# Patient Record
Sex: Female | Born: 1945 | Race: White | Hispanic: No | Marital: Married | Smoking: Never smoker
Health system: Southern US, Community
[De-identification: ages and names within clinical notes are randomized; demographics above are authoritative.]

## PROBLEM LIST (undated history)

## (undated) DIAGNOSIS — R112 Nausea with vomiting, unspecified: Secondary | ICD-10-CM

## (undated) DIAGNOSIS — J45909 Unspecified asthma, uncomplicated: Secondary | ICD-10-CM

## (undated) DIAGNOSIS — M069 Rheumatoid arthritis, unspecified: Secondary | ICD-10-CM

## (undated) DIAGNOSIS — G4730 Sleep apnea, unspecified: Secondary | ICD-10-CM

## (undated) DIAGNOSIS — K589 Irritable bowel syndrome without diarrhea: Secondary | ICD-10-CM

## (undated) DIAGNOSIS — J849 Interstitial pulmonary disease, unspecified: Secondary | ICD-10-CM

## (undated) DIAGNOSIS — I10 Essential (primary) hypertension: Secondary | ICD-10-CM

## (undated) DIAGNOSIS — I272 Pulmonary hypertension, unspecified: Secondary | ICD-10-CM

## (undated) DIAGNOSIS — M1990 Unspecified osteoarthritis, unspecified site: Secondary | ICD-10-CM

## (undated) DIAGNOSIS — Z9981 Dependence on supplemental oxygen: Secondary | ICD-10-CM

## (undated) DIAGNOSIS — K219 Gastro-esophageal reflux disease without esophagitis: Secondary | ICD-10-CM

## (undated) DIAGNOSIS — F419 Anxiety disorder, unspecified: Secondary | ICD-10-CM

## (undated) DIAGNOSIS — Z9889 Other specified postprocedural states: Secondary | ICD-10-CM

## (undated) HISTORY — DX: Unspecified asthma, uncomplicated: J45.909

## (undated) HISTORY — DX: Gastro-esophageal reflux disease without esophagitis: K21.9

## (undated) HISTORY — DX: Rheumatoid arthritis, unspecified: M06.9

## (undated) HISTORY — PX: ABDOMINAL HYSTERECTOMY: SHX81

## (undated) HISTORY — DX: Pulmonary hypertension, unspecified: I27.20

## (undated) HISTORY — PX: TONSILLECTOMY: SUR1361

## (undated) HISTORY — PX: TOTAL KNEE ARTHROPLASTY: SHX125

## (undated) HISTORY — PX: BACK SURGERY: SHX140

## (undated) HISTORY — PX: CHOLECYSTECTOMY: SHX55

## (undated) HISTORY — DX: Irritable bowel syndrome, unspecified: K58.9

---

## 1975-07-19 HISTORY — PX: OTHER SURGICAL HISTORY: SHX169

## 2001-05-03 ENCOUNTER — Encounter: Payer: Self-pay | Admitting: Orthopedic Surgery

## 2001-05-07 ENCOUNTER — Inpatient Hospital Stay (HOSPITAL_COMMUNITY): Admission: RE | Admit: 2001-05-07 | Discharge: 2001-05-11 | Payer: Self-pay | Admitting: Orthopedic Surgery

## 2001-09-07 ENCOUNTER — Other Ambulatory Visit: Admission: RE | Admit: 2001-09-07 | Discharge: 2001-09-07 | Payer: Self-pay | Admitting: Obstetrics and Gynecology

## 2001-10-25 ENCOUNTER — Ambulatory Visit (HOSPITAL_COMMUNITY): Admission: RE | Admit: 2001-10-25 | Discharge: 2001-10-25 | Payer: Self-pay | Admitting: Gastroenterology

## 2001-10-25 ENCOUNTER — Encounter (INDEPENDENT_AMBULATORY_CARE_PROVIDER_SITE_OTHER): Payer: Self-pay | Admitting: Specialist

## 2002-10-11 ENCOUNTER — Other Ambulatory Visit: Admission: RE | Admit: 2002-10-11 | Discharge: 2002-10-11 | Payer: Self-pay | Admitting: Obstetrics and Gynecology

## 2003-01-03 ENCOUNTER — Encounter: Admission: RE | Admit: 2003-01-03 | Discharge: 2003-01-03 | Payer: Self-pay | Admitting: Gastroenterology

## 2003-01-03 ENCOUNTER — Encounter: Payer: Self-pay | Admitting: Gastroenterology

## 2003-08-25 ENCOUNTER — Inpatient Hospital Stay (HOSPITAL_COMMUNITY): Admission: RE | Admit: 2003-08-25 | Discharge: 2003-08-29 | Payer: Self-pay | Admitting: Orthopedic Surgery

## 2003-10-21 ENCOUNTER — Other Ambulatory Visit: Admission: RE | Admit: 2003-10-21 | Discharge: 2003-10-21 | Payer: Self-pay | Admitting: Obstetrics and Gynecology

## 2004-07-21 ENCOUNTER — Ambulatory Visit: Payer: Self-pay | Admitting: Internal Medicine

## 2004-08-26 ENCOUNTER — Ambulatory Visit: Payer: Self-pay | Admitting: Internal Medicine

## 2004-10-06 ENCOUNTER — Ambulatory Visit: Payer: Self-pay | Admitting: Internal Medicine

## 2004-10-28 ENCOUNTER — Ambulatory Visit: Payer: Self-pay | Admitting: Internal Medicine

## 2004-11-25 ENCOUNTER — Other Ambulatory Visit: Admission: RE | Admit: 2004-11-25 | Discharge: 2004-11-25 | Payer: Self-pay | Admitting: Obstetrics and Gynecology

## 2004-11-25 ENCOUNTER — Encounter: Admission: RE | Admit: 2004-11-25 | Discharge: 2004-11-25 | Payer: Self-pay | Admitting: Gastroenterology

## 2004-12-22 ENCOUNTER — Ambulatory Visit: Payer: Self-pay | Admitting: Internal Medicine

## 2005-03-10 ENCOUNTER — Ambulatory Visit: Payer: Self-pay | Admitting: Internal Medicine

## 2005-03-23 ENCOUNTER — Ambulatory Visit: Payer: Self-pay | Admitting: Internal Medicine

## 2005-04-20 ENCOUNTER — Ambulatory Visit: Payer: Self-pay | Admitting: Internal Medicine

## 2005-05-18 ENCOUNTER — Ambulatory Visit: Payer: Self-pay | Admitting: Internal Medicine

## 2005-06-16 ENCOUNTER — Ambulatory Visit: Payer: Self-pay | Admitting: Internal Medicine

## 2005-06-28 ENCOUNTER — Encounter (INDEPENDENT_AMBULATORY_CARE_PROVIDER_SITE_OTHER): Payer: Self-pay | Admitting: *Deleted

## 2005-06-28 ENCOUNTER — Ambulatory Visit (HOSPITAL_COMMUNITY): Admission: RE | Admit: 2005-06-28 | Discharge: 2005-06-28 | Payer: Self-pay | Admitting: Surgery

## 2005-07-01 ENCOUNTER — Inpatient Hospital Stay (HOSPITAL_COMMUNITY): Admission: EM | Admit: 2005-07-01 | Discharge: 2005-07-06 | Payer: Self-pay | Admitting: *Deleted

## 2005-08-05 ENCOUNTER — Ambulatory Visit: Payer: Self-pay | Admitting: Internal Medicine

## 2005-09-01 ENCOUNTER — Ambulatory Visit (HOSPITAL_COMMUNITY): Admission: RE | Admit: 2005-09-01 | Discharge: 2005-09-01 | Payer: Self-pay | Admitting: Gastroenterology

## 2005-09-21 ENCOUNTER — Ambulatory Visit: Payer: Self-pay | Admitting: Internal Medicine

## 2005-10-05 ENCOUNTER — Ambulatory Visit (HOSPITAL_COMMUNITY): Admission: RE | Admit: 2005-10-05 | Discharge: 2005-10-05 | Payer: Self-pay | Admitting: Obstetrics and Gynecology

## 2005-10-05 ENCOUNTER — Encounter (INDEPENDENT_AMBULATORY_CARE_PROVIDER_SITE_OTHER): Payer: Self-pay | Admitting: Specialist

## 2005-10-10 ENCOUNTER — Ambulatory Visit: Payer: Self-pay | Admitting: Internal Medicine

## 2005-10-20 ENCOUNTER — Ambulatory Visit: Payer: Self-pay | Admitting: Pulmonary Disease

## 2005-10-31 ENCOUNTER — Ambulatory Visit: Payer: Self-pay | Admitting: Internal Medicine

## 2005-11-09 ENCOUNTER — Ambulatory Visit: Payer: Self-pay | Admitting: Internal Medicine

## 2005-11-16 ENCOUNTER — Ambulatory Visit: Payer: Self-pay | Admitting: Internal Medicine

## 2005-12-21 ENCOUNTER — Ambulatory Visit: Payer: Self-pay | Admitting: Internal Medicine

## 2006-01-10 ENCOUNTER — Ambulatory Visit: Payer: Self-pay | Admitting: Internal Medicine

## 2006-02-27 ENCOUNTER — Encounter (INDEPENDENT_AMBULATORY_CARE_PROVIDER_SITE_OTHER): Payer: Self-pay | Admitting: *Deleted

## 2006-02-28 ENCOUNTER — Inpatient Hospital Stay (HOSPITAL_COMMUNITY): Admission: RE | Admit: 2006-02-28 | Discharge: 2006-03-01 | Payer: Self-pay | Admitting: Obstetrics and Gynecology

## 2006-03-27 ENCOUNTER — Ambulatory Visit: Payer: Self-pay | Admitting: Internal Medicine

## 2006-05-11 ENCOUNTER — Ambulatory Visit: Payer: Self-pay | Admitting: Internal Medicine

## 2006-06-20 ENCOUNTER — Ambulatory Visit: Payer: Self-pay | Admitting: Internal Medicine

## 2006-07-20 ENCOUNTER — Ambulatory Visit: Payer: Self-pay | Admitting: Internal Medicine

## 2006-08-18 ENCOUNTER — Ambulatory Visit: Payer: Self-pay | Admitting: Internal Medicine

## 2006-08-22 ENCOUNTER — Ambulatory Visit: Payer: Self-pay | Admitting: Internal Medicine

## 2006-08-29 ENCOUNTER — Ambulatory Visit: Payer: Self-pay | Admitting: Internal Medicine

## 2006-08-29 LAB — CONVERTED CEMR LAB
Basophils Absolute: 0 10*3/uL (ref 0.0–0.1)
Basophils Relative: 0.1 % (ref 0.0–1.0)
Eosinophils Absolute: 0 10*3/uL (ref 0.0–0.6)
Eosinophils Relative: 0 % (ref 0.0–5.0)
HCT: 44.3 % (ref 36.0–46.0)
Hemoglobin: 15.2 g/dL — ABNORMAL HIGH (ref 12.0–15.0)
Lymphocytes Relative: 11.7 % — ABNORMAL LOW (ref 12.0–46.0)
MCHC: 34.3 g/dL (ref 30.0–36.0)
MCV: 91.6 fL (ref 78.0–100.0)
Monocytes Absolute: 1 10*3/uL — ABNORMAL HIGH (ref 0.2–0.7)
Monocytes Relative: 6.9 % (ref 3.0–11.0)
Neutro Abs: 12 10*3/uL — ABNORMAL HIGH (ref 1.4–7.7)
Neutrophils Relative %: 81.3 % — ABNORMAL HIGH (ref 43.0–77.0)
Platelets: 437 10*3/uL — ABNORMAL HIGH (ref 150–400)
RBC: 4.83 M/uL (ref 3.87–5.11)
RDW: 14.2 % (ref 11.5–14.6)
WBC: 14.7 10*3/uL — ABNORMAL HIGH (ref 4.5–10.5)

## 2006-09-08 ENCOUNTER — Ambulatory Visit: Payer: Self-pay | Admitting: Internal Medicine

## 2006-10-19 ENCOUNTER — Ambulatory Visit: Payer: Self-pay | Admitting: Internal Medicine

## 2006-10-20 ENCOUNTER — Encounter: Payer: Self-pay | Admitting: Internal Medicine

## 2006-11-30 ENCOUNTER — Ambulatory Visit: Payer: Self-pay | Admitting: Internal Medicine

## 2006-12-29 ENCOUNTER — Ambulatory Visit: Payer: Self-pay | Admitting: Internal Medicine

## 2007-01-26 ENCOUNTER — Encounter: Admission: RE | Admit: 2007-01-26 | Discharge: 2007-01-26 | Payer: Self-pay | Admitting: Family Medicine

## 2007-02-06 ENCOUNTER — Ambulatory Visit: Payer: Self-pay | Admitting: Internal Medicine

## 2007-02-07 ENCOUNTER — Ambulatory Visit (HOSPITAL_BASED_OUTPATIENT_CLINIC_OR_DEPARTMENT_OTHER): Admission: RE | Admit: 2007-02-07 | Discharge: 2007-02-07 | Payer: Self-pay | Admitting: Orthopedic Surgery

## 2007-04-17 ENCOUNTER — Ambulatory Visit: Payer: Self-pay | Admitting: Internal Medicine

## 2007-05-10 ENCOUNTER — Ambulatory Visit: Payer: Self-pay | Admitting: Internal Medicine

## 2007-05-12 DIAGNOSIS — K219 Gastro-esophageal reflux disease without esophagitis: Secondary | ICD-10-CM | POA: Insufficient documentation

## 2007-05-12 DIAGNOSIS — M069 Rheumatoid arthritis, unspecified: Secondary | ICD-10-CM | POA: Insufficient documentation

## 2007-05-12 DIAGNOSIS — J3089 Other allergic rhinitis: Secondary | ICD-10-CM | POA: Insufficient documentation

## 2007-05-12 DIAGNOSIS — J209 Acute bronchitis, unspecified: Secondary | ICD-10-CM | POA: Insufficient documentation

## 2007-05-12 DIAGNOSIS — J302 Other seasonal allergic rhinitis: Secondary | ICD-10-CM | POA: Insufficient documentation

## 2007-07-10 ENCOUNTER — Ambulatory Visit: Payer: Self-pay | Admitting: Internal Medicine

## 2007-07-19 HISTORY — PX: ANKLE FUSION: SHX881

## 2007-08-29 ENCOUNTER — Telehealth: Payer: Self-pay | Admitting: Internal Medicine

## 2007-09-07 ENCOUNTER — Ambulatory Visit: Payer: Self-pay | Admitting: Internal Medicine

## 2007-11-06 ENCOUNTER — Ambulatory Visit: Payer: Self-pay | Admitting: Internal Medicine

## 2007-11-23 ENCOUNTER — Ambulatory Visit: Payer: Self-pay | Admitting: Internal Medicine

## 2007-12-21 ENCOUNTER — Telehealth: Payer: Self-pay | Admitting: Internal Medicine

## 2008-01-17 ENCOUNTER — Ambulatory Visit: Payer: Self-pay | Admitting: Internal Medicine

## 2008-02-07 ENCOUNTER — Encounter: Payer: Self-pay | Admitting: Internal Medicine

## 2008-02-11 ENCOUNTER — Telehealth (INDEPENDENT_AMBULATORY_CARE_PROVIDER_SITE_OTHER): Payer: Self-pay | Admitting: *Deleted

## 2008-02-27 ENCOUNTER — Ambulatory Visit: Payer: Self-pay | Admitting: Internal Medicine

## 2008-03-03 ENCOUNTER — Ambulatory Visit: Payer: Self-pay | Admitting: Internal Medicine

## 2008-03-03 ENCOUNTER — Ambulatory Visit: Admission: RE | Admit: 2008-03-03 | Discharge: 2008-03-03 | Payer: Self-pay | Admitting: Family Medicine

## 2008-03-05 ENCOUNTER — Ambulatory Visit: Payer: Self-pay | Admitting: Cardiology

## 2008-03-10 ENCOUNTER — Telehealth (INDEPENDENT_AMBULATORY_CARE_PROVIDER_SITE_OTHER): Payer: Self-pay | Admitting: *Deleted

## 2008-03-25 ENCOUNTER — Ambulatory Visit: Payer: Self-pay | Admitting: Internal Medicine

## 2008-03-25 DIAGNOSIS — F341 Dysthymic disorder: Secondary | ICD-10-CM | POA: Insufficient documentation

## 2008-03-31 ENCOUNTER — Encounter: Payer: Self-pay | Admitting: Internal Medicine

## 2008-04-03 ENCOUNTER — Ambulatory Visit: Payer: Self-pay | Admitting: Professional

## 2008-04-03 ENCOUNTER — Telehealth (INDEPENDENT_AMBULATORY_CARE_PROVIDER_SITE_OTHER): Payer: Self-pay | Admitting: *Deleted

## 2008-04-10 ENCOUNTER — Ambulatory Visit: Payer: Self-pay | Admitting: Professional

## 2008-04-21 ENCOUNTER — Ambulatory Visit: Payer: Self-pay | Admitting: Professional

## 2008-04-22 ENCOUNTER — Encounter: Payer: Self-pay | Admitting: Internal Medicine

## 2008-04-28 ENCOUNTER — Ambulatory Visit: Payer: Self-pay | Admitting: Professional

## 2008-04-30 ENCOUNTER — Ambulatory Visit: Payer: Self-pay | Admitting: Pulmonary Disease

## 2008-05-01 ENCOUNTER — Encounter: Payer: Self-pay | Admitting: Internal Medicine

## 2008-05-05 ENCOUNTER — Ambulatory Visit: Payer: Self-pay | Admitting: Professional

## 2008-05-12 ENCOUNTER — Ambulatory Visit: Payer: Self-pay | Admitting: Professional

## 2008-05-15 ENCOUNTER — Ambulatory Visit: Payer: Self-pay | Admitting: Internal Medicine

## 2008-05-19 ENCOUNTER — Ambulatory Visit: Payer: Self-pay | Admitting: Professional

## 2008-05-29 ENCOUNTER — Telehealth (INDEPENDENT_AMBULATORY_CARE_PROVIDER_SITE_OTHER): Payer: Self-pay | Admitting: *Deleted

## 2008-06-02 ENCOUNTER — Ambulatory Visit: Payer: Self-pay | Admitting: Professional

## 2008-06-11 ENCOUNTER — Ambulatory Visit: Payer: Self-pay | Admitting: Internal Medicine

## 2008-06-16 ENCOUNTER — Ambulatory Visit: Payer: Self-pay | Admitting: Professional

## 2008-06-30 ENCOUNTER — Ambulatory Visit: Payer: Self-pay | Admitting: Professional

## 2008-07-03 ENCOUNTER — Telehealth (INDEPENDENT_AMBULATORY_CARE_PROVIDER_SITE_OTHER): Payer: Self-pay | Admitting: *Deleted

## 2008-07-04 ENCOUNTER — Ambulatory Visit: Payer: Self-pay | Admitting: Internal Medicine

## 2008-08-13 ENCOUNTER — Ambulatory Visit: Payer: Self-pay | Admitting: Internal Medicine

## 2008-09-16 ENCOUNTER — Ambulatory Visit: Payer: Self-pay | Admitting: Internal Medicine

## 2008-09-17 ENCOUNTER — Telehealth (INDEPENDENT_AMBULATORY_CARE_PROVIDER_SITE_OTHER): Payer: Self-pay | Admitting: *Deleted

## 2008-10-03 ENCOUNTER — Ambulatory Visit: Payer: Self-pay | Admitting: Internal Medicine

## 2008-12-19 ENCOUNTER — Ambulatory Visit: Payer: Self-pay | Admitting: Internal Medicine

## 2008-12-19 DIAGNOSIS — J328 Other chronic sinusitis: Secondary | ICD-10-CM | POA: Insufficient documentation

## 2009-01-30 ENCOUNTER — Ambulatory Visit: Payer: Self-pay | Admitting: Internal Medicine

## 2009-04-20 ENCOUNTER — Ambulatory Visit: Payer: Self-pay | Admitting: Internal Medicine

## 2009-04-20 DIAGNOSIS — J4540 Moderate persistent asthma, uncomplicated: Secondary | ICD-10-CM | POA: Insufficient documentation

## 2009-05-04 ENCOUNTER — Ambulatory Visit: Payer: Self-pay | Admitting: Internal Medicine

## 2009-05-04 ENCOUNTER — Telehealth: Payer: Self-pay | Admitting: Internal Medicine

## 2009-07-21 ENCOUNTER — Ambulatory Visit: Payer: Self-pay | Admitting: Internal Medicine

## 2009-07-21 DIAGNOSIS — J449 Chronic obstructive pulmonary disease, unspecified: Secondary | ICD-10-CM | POA: Insufficient documentation

## 2009-07-21 DIAGNOSIS — J439 Emphysema, unspecified: Secondary | ICD-10-CM | POA: Insufficient documentation

## 2009-07-21 DIAGNOSIS — J441 Chronic obstructive pulmonary disease with (acute) exacerbation: Secondary | ICD-10-CM | POA: Insufficient documentation

## 2009-07-24 ENCOUNTER — Telehealth: Payer: Self-pay | Admitting: Internal Medicine

## 2009-08-18 ENCOUNTER — Ambulatory Visit: Payer: Self-pay | Admitting: Internal Medicine

## 2009-08-18 DIAGNOSIS — B370 Candidal stomatitis: Secondary | ICD-10-CM | POA: Insufficient documentation

## 2009-10-02 ENCOUNTER — Ambulatory Visit: Payer: Self-pay | Admitting: Internal Medicine

## 2009-12-02 ENCOUNTER — Ambulatory Visit: Payer: Self-pay | Admitting: Internal Medicine

## 2009-12-08 ENCOUNTER — Telehealth: Payer: Self-pay | Admitting: Internal Medicine

## 2009-12-24 ENCOUNTER — Ambulatory Visit: Payer: Self-pay | Admitting: Internal Medicine

## 2009-12-29 ENCOUNTER — Telehealth: Payer: Self-pay | Admitting: Internal Medicine

## 2010-01-12 ENCOUNTER — Ambulatory Visit: Payer: Self-pay | Admitting: Internal Medicine

## 2010-02-17 ENCOUNTER — Ambulatory Visit: Payer: Self-pay | Admitting: Internal Medicine

## 2010-04-01 ENCOUNTER — Telehealth: Payer: Self-pay | Admitting: Internal Medicine

## 2010-05-21 ENCOUNTER — Ambulatory Visit: Payer: Self-pay | Admitting: Internal Medicine

## 2010-06-14 ENCOUNTER — Ambulatory Visit: Payer: Self-pay | Admitting: Internal Medicine

## 2010-06-14 ENCOUNTER — Telehealth (INDEPENDENT_AMBULATORY_CARE_PROVIDER_SITE_OTHER): Payer: Self-pay | Admitting: *Deleted

## 2010-06-23 ENCOUNTER — Telehealth (INDEPENDENT_AMBULATORY_CARE_PROVIDER_SITE_OTHER): Payer: Self-pay | Admitting: *Deleted

## 2010-07-15 ENCOUNTER — Telehealth (INDEPENDENT_AMBULATORY_CARE_PROVIDER_SITE_OTHER): Payer: Self-pay | Admitting: *Deleted

## 2010-07-29 ENCOUNTER — Encounter
Admission: RE | Admit: 2010-07-29 | Discharge: 2010-07-29 | Payer: Self-pay | Source: Home / Self Care | Attending: Orthopedic Surgery | Admitting: Orthopedic Surgery

## 2010-08-17 NOTE — Assessment & Plan Note (Signed)
Summary: 4 months/apc   Primary Provider/Referring Provider:  W. Elkins/ Truslow  CC:  4 month follow up-allergies..  History of Present Illness: 08/13/08- Allergic rhinitis, asthmaticbronchitis, RA Stressed- mother GI bleed/diverticulosis. Got run down and chilled caring then for mother. Was feeling well until 2 days ago woke from sleep with cough. Still some mild postnasal drainage, coughs some mucus. Going to [place] in March.  09/16/08- Allergic rhinitis, asthmatic bronchitis Going to [place] in a few days. Has not started a pred taper Dr Charlestine Night gave her for the trip.  3 days ago began getting hoarse, with green from nose and chest with Neti pot and nebulizer.Doubts fever but throat feels irritated. Daughter has "bacterial throat infection". Had Norovirus GI, now resolved.  12/19/08- Allergic rhinitis, rhinosinusitis, asthmatic bronchitis, rheumatoid arthritis Needed antibiotic and diflucan while in [place], but did well for awhile on return. In last 2-3 weeks - cough with yellow, getting worse and waking her at night, nose and throat pressure and drainage, no fever. Neti pot brings out green. Exposed to a lot of Tarita Deshmukh grandchildren with coughs etc.  April 20, 2009-  Allergic rhinits, rhinosinusitis, asthmatic bronchitis, Rheumatoid Arthritis Tussionex and benzonatate have been a help for cough. Some wheeze. last definite purulent discharge was at least 2 weeks ago. Rain clears the air for her.      Current Medications (verified): 1)  Singulair 10 Mg  Tabs (Montelukast Sodium) .... One By Mouth Once Daily 2)  Claritin 10 Mg Tabs (Loratadine) .... Take 1 By Mouth Once Daily 3)  Verapamil Hcl Cr 240 Mg  Tbcr (Verapamil Hcl) .... One By Mouth Once Daily 4)  Klor-Con 20 Meq Pack (Potassium Chloride) .... Take 1 Tablet By Mouth Four Times A Day 5)  Triamterene-Hctz 75-50 Mg  Tabs (Triamterene-Hctz) .... Take 1-2  Tablet By Mouth Once A Day 6)  Clidinium-Chlordiazepoxide 2.5-5 Mg  Caps  (Clidinium-Chlordiazepoxide) .... Three Times A Day 7)  Nexium 40 Mg  Cpdr (Esomeprazole Magnesium) .... Take 1 By Mouth Once Daily 8)  Flonase 50 Mcg/act  Susp (Fluticasone Propionate) .... 2 Puffs in A.m. 9)  Vitamin D 50000 Unit  Caps (Ergocalciferol) .... One By Mouth Every Two Weeks 10)  Fosamax Plus D 70-5600 Mg-Unit  Tabs (Alendronate-Cholecalciferol) .... Once A Week 11)  Symbicort 160-4.5 Mcg/act  Aero (Budesonide-Formoterol Fumarate) .... Two Puffs Twice Daily 12)  Proair Hfa 108 (90 Base) Mcg/act Aers (Albuterol Sulfate) .... 2 Puffs Every 4-6 Hours As Needed 13)  Allergy Vaccine 1:10 Go (W-E) .... Once A Week 14)  Epipen 2-Pak 0.3 Mg/0.74m (1:1000)  Devi (Epinephrine Hcl (Anaphylaxis)) .... For Severe Allergic Reaction 15)  Celebrex 200 Mg  Caps (Celecoxib) .... Take 1 Tablet By Mouth Once A Day 16)  Ipratropium-Albuterol 0.5-2.5 (3) Mg/362mSoln (Ipratropium-Albuterol) ...Marland Kitchen 1 Neb Four Times A Day As Needed 17)  Fenofibrate Micronized 134 Mg Caps (Fenofibrate Micronized) .... Take 1 Capsule By Mouth Once A Day 18)  Glycopyrrolate 1 Mg Tabs (Glycopyrrolate) ...Marland Kitchen 1 Tab By Mouth As Needed 19)  Centrum Silver  Tabs (Multiple Vitamins-Minerals) .... Take 1 Tablet By Mouth Once A Day 20)  Xanax 1 Mg Tabs (Alprazolam) .... Take 1 Tab By Mouth At Bedtime 21)  Voltaren 1 % Gel (Diclofenac Sodium) .... Apply To Affected Area Four Times A Day 22)  Prozac 20 Mg Caps (Fluoxetine Hcl) .... Take 1 By Mouth Once Daily 23)  Tussionex Pennkinetic Er 8-10 Mg/86m21mqcr (Chlorpheniramine-Hydrocodone) ....Marland Kitchen1 Tsp Two Times A Day Prn 24)  Tramadol Hcl 50 Mg Tabs (Tramadol Hcl) .... 2 Tablets Every 6 Hrs As Needed 25)  Clarithromycin 250 Mg Tabs (Clarithromycin) .Marland Kitchen.. 1 Two Times A Day Before Meals 26)  Fluconazole 100 Mg Tabs (Fluconazole) .... Take 1 Tablet By Mouth Once A Day X 7 Days 27)  Bd Tb Syringe 27g X 1/2" 0.5 Ml Misc (Tuberculin-Allergy Syringes) .... Use To Give Allergy Vaccine or Other Medications  As Directed 28)  Benzonatate 100 Mg Caps (Benzonatate) .... Take 1 Capsule Four Times A Day As Needed 29)  Chlorzoxazone 500 Mg Tabs (Chlorzoxazone) .... Take 1 By Mouth Every 6 Hours As Needed 30)  Cefdinir 300 Mg Caps (Cefdinir) .... 2 Daily 31)  Fluconazole 150 Mg Tabs (Fluconazole) .Marland Kitchen.. 1 Daily  Allergies (verified): 1)  Erythromycin  Past History:  Past Medical History: Last updated: 03/03/2008 REFLUX, ESOPHAGEAL (ICD-530.81) ASTHMATIC BRONCHITIS, ACUTE (ICD-466.0) RHEUMATOID ARTHRITIS (ICD-714.0) ALLERGIC RHINITIS (ICD-477.9)    Past Surgical History: Last updated: 03/03/2008 Fusion left ankle- 2009  Family History: Last updated: 03/10/2008 Mother- living age 40; heart disease, arthritis. Father- deceased age 25; heart attack. Sibling 1- living age 37; DM, heart attack  Social History: Last updated: 03/10/2008 Patient never smoked.  Positive history of passive tobacco smoke exposure.  Exercise- at least 2 times weekly Caffeine-2 cups in morning ETOH-glass of wine each night. Married with 2 children.   Risk Factors: Smoking Status: never (03/03/2008) Passive Smoke Exposure: yes (03/10/2008)  Review of Systems      See HPI       The patient complains of shortness of breath with activity, shortness of breath at rest, productive cough, and non-productive cough.  The patient denies coughing up blood, chest pain, irregular heartbeats, acid heartburn, indigestion, loss of appetite, weight change, abdominal pain, difficulty swallowing, sore throat, tooth/dental problems, headaches, nasal congestion/difficulty breathing through nose, and sneezing.    Vital Signs:  Patient profile:   65 year old female Weight:      169.38 pounds O2 Sat:      96 % on Room air Pulse rate:   85 / minute BP sitting:   110 / 64  (left arm) Cuff size:   regular  Vitals Entered By: Clayborne Dana CMA (April 20, 2009 2:43 PM)  O2 Flow:  Room air  Physical Exam  Additional Exam:   General: A/Ox3; pleasant and cooperative, NAD, overweight, talkative, she looks as if she feels better. SKIN: no rash, lesions NODES: no lymphadenopathy HEENT: /AT, EOM- WNL, Conjuctivae- clear, PERRLA, TM-WNL, Nose- clear, Throat-  tongue, mild hoarse, thrush, mucus in nose NECK: Supple w/ fair ROM, JVD- none, normal carotid impulses w/o bruits Thyroid- CHEST: Clear to P&A, light loose cough with laiughter HEART: RRR, no m/g/r heard KPQ:AESL, nl pulses, no edema  NEURO: Grossly intact to observation     Impression & Recommendations:  Problem # 1:  ALLERGIC RHINITIS (ICD-477.9) Seasonal and perennial components. Allergy vaccine does seem to help. We reminded about available antihistamines. Her updated medication list for this problem includes:    Claritin 10 Mg Tabs (Loratadine) .Marland Kitchen... Take 1 by mouth once daily    Flonase 50 Mcg/act Susp (Fluticasone propionate) .Marland Kitchen... 2 puffs in a.m.  Problem # 2:  ASTHMA (ICD-493.90) This is near her baseline. Current meds should hold her. We can refill her cough meds for appropriate use, and give flu vax today.  Medications Added to Medication List This Visit: 1)  Prozac 20 Mg Caps (Fluoxetine hcl) .... Take 1 by mouth once  daily  Other Orders: Admin 1st Vaccine [phone]) Flu Vaccine 16yr + ((03500  Patient Instructions: 1)  Please schedule a follow-up appointment in 4 months. 2)  Scripts for Tussionex and benzonatate 3)  Flu vax Prescriptions: BENZONATATE 100 MG CAPS (BENZONATATE) take 1 capsule four times a day as needed  #75 x 5   Entered and Authorized by:   CDeneise LeverMD   Signed by:   CDeneise LeverMD on 04/20/2009   Method used:   Print then Give to Patient   RxID:   [id number] 8-10 MG/5ML LQCR (CHLORPHENIRAMINE-HYDROCODONE) 1 tsp two times a day prn  #300 ml x 0   Entered and Authorized by:   CDeneise LeverMD   Signed by:   CDeneise LeverMD on 04/20/2009   Method used:   Print then Give  to Patient   RxID::   [id number] Flu Vaccine Consent Questions     Do you have a history of severe allergic reactions to this vaccine? no    Any prior history of allergic reactions to egg and/or gelatin? no    Do you have a sensitivity to the preservative Thimersol? no    Do you have a past history of Guillan-Barre Syndrome? no    Do you currently have an acute febrile illness? no    Have you ever had a severe reaction to latex? no    Vaccine information given and explained to patient? yes    Are you currently pregnant? no    Lot Number:[id number]   Exp Date:01/14/2010   Site Given  Left Deltoid IMd by:   CDeneise LeverMD on 04/20/2009   Method used:   Print then Give to Patient   RxID::   [id number]   .lbflu

## 2010-08-17 NOTE — Assessment & Plan Note (Signed)
Summary: GREEN CONGESTION/ SORE THROAT/   Primary Provider/Referring Provider:  W. Elkins/ Truslow  CC:  Accute visit-chest congestion and sore throat. Husband has had Bronchitis recently.[name]  History of Present Illness: April 20, 2009-  Allergic rhinits, rhinosinusitis, asthmatic bronchitis, Rheumatoid Arthritis Tussionex and benzonatate have been a help for cough. Some wheeze. last definite purulent discharge was at least 2 weeks ago. Rain clears the air for her.  July 21, 2009- Allergic rhinitis, rhinosinusitis, asthmatic bronchitis, Rheumatoid arthritis After last here, she says cough never really went away. Feels patch of thrush. [place]. Did get flu shot and has had pneumovax twice. Phlegm won't come up easily.Wheezes. Denies chest pain. May have had some fever. Denies feeling reflux.   August 18, 2009- Allergic rhinitis, rhinosinusitis, asthmatic bronchitis, rheumatoid arthritis Thought she was better after last here, but again hoarse with yelloow/ green nasal drainage. Comes from throat- not chest. Gargles and uses Neti pot  less wheeze- chest is better. She asks about retrying diflucan with an antibiotic.  October 02, 2009- Allergic rhinitis, rhinosinusitis, asthmatic bronchitis, rheumatoid arthritis She is acutely ill and feels she is sick more often. Husband is on Zpak for cough and sore throat. Again noting yellow, sometimes green nasal discharge, throat discomfort, some cough.. She will be moving and they have been stirring up dust as they start the moving process.She describes steady exposure to dust, paint fumes and related irritants as they get the house ready. She felt best while on both antibioitic and diflucan. Some cough and a little wheeze. Describes thick sputum.Thinks she's had some fever.    Current Medications (verified): 1)  Singulair 10 Mg  Tabs (Montelukast Sodium) .... One By Mouth Once Daily 2)  Verapamil Hcl Cr 240 Mg  Tbcr (Verapamil Hcl)  .... One By Mouth Once Daily 3)  Klor-Con 20 Meq Pack (Potassium Chloride) .... Take 1 Tablet By Mouth Four Times A Day 4)  Triamterene-Hctz 75-50 Mg  Tabs (Triamterene-Hctz) .... Take 1-2  Tablet By Mouth Once A Day 5)  Clidinium-Chlordiazepoxide 2.5-5 Mg  Caps (Clidinium-Chlordiazepoxide) .... Three Times A Day 6)  Nexium 40 Mg  Cpdr (Esomeprazole Magnesium) .... Take 1 By Mouth Once Daily 7)  Flonase 50 Mcg/act  Susp (Fluticasone Propionate) .... 2 Puffs in A.m. 8)  Vitamin D 50000 Unit  Caps (Ergocalciferol) .... One By Mouth Every Two Weeks 9)  Fosamax Plus D 70-5600 Mg-Unit  Tabs (Alendronate-Cholecalciferol) .... Once A Week 10)  Symbicort 160-4.5 Mcg/act  Aero (Budesonide-Formoterol Fumarate) .... Two Puffs Twice Daily 11)  Proair Hfa 108 (90 Base) Mcg/act Aers (Albuterol Sulfate) .... 2 Puffs Every 4-6 Hours As Needed 12)  Allergy Vaccine 1:10 Go (W-E) .... Once A Week 13)  Epipen 2-Pak 0.3 Mg/0.28m (1:1000)  Devi (Epinephrine Hcl (Anaphylaxis)) .... For Severe Allergic Reaction 14)  Celebrex 200 Mg  Caps (Celecoxib) .... Take 1 Tablet By Mouth Once A Day 15)  Ipratropium-Albuterol 0.5-2.5 (3) Mg/313mSoln (Ipratropium-Albuterol) ...[name] 1 Neb Four Times A Day As Needed 16)  Fenofibrate Micronized 134 Mg Caps (Fenofibrate Micronized) .... Take 1 Capsule By Mouth Once A Day 17)  Glycopyrrolate 1 Mg Tabs (Glycopyrrolate) ...[name] 1 Tab By Mouth As Needed 18)  Centrum Silver  Tabs (Multiple Vitamins-Minerals) .... Take 1 Tablet By Mouth Once A Day 19)  Xanax 1 Mg Tabs (Alprazolam) .... Take 1 Tab By Mouth At Bedtime 20)  Voltaren 1 % Gel (Diclofenac Sodium) .... Apply To Affected Area Four Times A Day 21)  Prozac 20 Mg Caps (Fluoxetine Hcl) .... Take 1 By Mouth Once Daily 22)  Tramadol Hcl 50 Mg Tabs (Tramadol Hcl) .... 2 Tablets Every 6 Hrs As Needed 23)  Bd Tb Syringe 27g X 1/2" 0.5 Ml Misc (Tuberculin-Allergy Syringes) .... Use To Give Allergy Vaccine or Other Medications As Directed 24)   Benzonatate 100 Mg Caps (Benzonatate) .... Take 1 Capsule Four Times A Day As Needed 25)  Chlorzoxazone 500 Mg Tabs (Chlorzoxazone) .... Take 1 By Mouth Every 6 Hours As Needed 26)  Allegra 180 Mg Tabs (Fexofenadine Hcl) .[name].. 1 Daily As Needed Antihistamine 27)  Fluconazole 150 Mg Tabs (Fluconazole) .[name].. 1 Daily 28)  Mycelex 10 Mg Troc (Clotrimazole) .... Melt in Mouth Twice Daily  Allergies (verified): 1)  ! * Oravig 2)  Erythromycin  Past History:  Past Medical History: Last updated: 03/03/2008 REFLUX, ESOPHAGEAL (ICD-530.81) ASTHMATIC BRONCHITIS, ACUTE (ICD-466.0) RHEUMATOID ARTHRITIS (ICD-714.0) ALLERGIC RHINITIS (ICD-477.9)    Past Surgical History: Last updated: 03/03/2008 Fusion left ankle- 2009  Family History: Last updated: 03/10/2008 Mother- living age 36; heart disease, arthritis. Father- deceased age 75; heart attack. Sibling 1- living age 72; DM, heart attack  Social History: Last updated: 03/10/2008 Patient never smoked.  Positive history of passive tobacco smoke exposure.  Exercise- at least 2 times weekly Caffeine-2 cups in morning ETOH-glass of wine each night. Married with 2 children.   Risk Factors: Smoking Status: never (03/03/2008) Passive Smoke Exposure: yes (03/10/2008)  Review of Systems      See HPI       The patient complains of fever, hoarseness, and prolonged cough.  The patient denies anorexia, weight loss, weight gain, vision loss, decreased hearing, chest pain, syncope, dyspnea on exertion, peripheral edema, headaches, hemoptysis, abdominal pain, and severe indigestion/heartburn.    Vital Signs:  Patient profile:   65 year old female Height:      62 inches Weight:      149.38 pounds BMI:     27.42 O2 Sat:      97 % on Room air Pulse rate:   101 / minute BP sitting:   148 / 90  (right arm) Cuff size:   regular  Vitals Entered By: Clayborne Dana CMA (October 02, 2009 9:13 AM)  O2 Flow:  Room air  Physical Exam  Additional  Exam:  General: A/Ox3; pleasant and cooperative, NAD, overweight, talkative, depressed affect SKIN: no rash, lesions NODES: no lymphadenopathy HEENT: [place]/AT, EOM- WNL, Conjuctivae- clear, PERRLA, TM-WNL, Nose- clear, Throat-  no thrush, red on soft palate., mild hoarse, mucus in nose NECK: Supple w/ fair ROM, JVD- none, normal carotid impulses w/o bruits Thyroid- CHEST: Clear to P&A, light dry coug, no wheeze HEART: RRR, no m/g/r heard ZCH:YIFO, nl pulses, no edema  NEURO: Grossly intact to observation     Impression & Recommendations:  Problem # 1:  RHINOSINUSITIS, 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 I don't see obvious thrush now, but she is using mycelex troches.  I am going to try a maintenance erythromycin strategy to suppress bronchitis. She had some loose stools with full dose erythromycin in past but is interested in trying this strategy.  Problem # 2:  ALLERGIC RHINITIS (ICD-477.9) She continues allergy vaccine. We discussed environmental control issues inher new home, especially to minimize house dust. She remains on allergy vaccine and Flonase, singulair . The following medications were removed from the medication list:    Claritin 10 Mg Tabs (Loratadine) .[name]... Take  1 by mouth once daily Her updated medication list for this problem includes:    Flonase 50 Mcg/act Susp (Fluticasone propionate) .[name]... 2 puffs in a.m.    Allegra 180 Mg Tabs (Fexofenadine hcl) .[name]... 1 daily as needed antihistamine  Problem # 3:  TRACHEOBRONCHITIS (ICD-490)  She isn't really wheezing much and i am going to try getting her off inhaled steroid/ Symbicort. The following medications were removed from the medication list:    Symbicort 160-4.5 Mcg/act Aero (Budesonide-formoterol fumarate) .[name]..[name] Two puffs twice daily    Amoxicillin-pot Clavulanate 875-125 Mg Tabs (Amoxicillin-pot clavulanate) .[name]... 1 twice daily Her updated medication list for this  problem includes:    Singulair 10 Mg Tabs (Montelukast sodium) ..... One by mouth once daily    Proair Hfa 108 (90 Base) Mcg/act Aers (Albuterol sulfate) .[name]... 2 puffs every 4-6 hours as needed    Ipratropium-albuterol 0.5-2.5 (3) Mg/6m Soln (Ipratropium-albuterol) ..[name].. 1 neb four times a day as needed    Benzonatate 100 Mg Caps (Benzonatate) ..[name].. Take 1 capsule four times a day as needed    E.e.s. 400 400 Mg Tabs (Erythromycin ethylsuccinate) ..[name].. 1 daily after meal    E.e.s. 400 400 Mg Tabs (Erythromycin ethylsuccinate) ..[name].. 1 daily afteer meal    Tussionex Pennkinetic Er 8-10 Mg/579mLqcr (Chlorpheniramine-hydrocodone) ...[name]. 1 teaspoon . two times a day as needed cough  Orders: Est. Patient Level III ([phone] Created Electronically ([id number] Medications Added to Medication List This Visit: 1)  Epipen 2-pak 0.3 Mg/0.24m78mevi (Epinephrine) .... For severe allergic reaction 2)  E.e.s. 400 400 Mg Tabs (Erythromycin ethylsuccinate) ....Hailey Kitchen1 daily after meal 3)  E.e.s. 400 400 Mg Tabs (Erythromycin ethylsuccinate) ....Hailey Kitchen1 daily afteer meal 4)  Tussionex Pennkinetic Er 8-10 Mg/5ml39mcr (Chlorpheniramine-hydrocodone) .... [name] teaspoon . two times a day as needed cough  Patient Instructions: 1)  Please schedule a follow-up appointment in 2 months. 2)  Stop Symbicort to see how you do without spraying a steroid into your mouth all the time. 3)  Try taking erythromycin once daily- scripts 4)  Reills to adjust amounts of your meds as discussed.  5)  Scripts to drug store for benzonatate, clotrimazole Prescriptions: TUSSIONEX PENNKINETIC ER 8-10 MG/5ML LQCR (CHLORPHENIRAMINE-HYDROCODONE) 1 teaspoon . two times a day as needed cough  #200 ml x 0   Entered and Authorized by:   ClinDeneise Lever  Signed by:   ClinDeneise Leveron 10/02/2009   Method used:   Print then Give to Patient   RxID:   :   [id number] 0.3 MG/0.3ML DEVI (EPINEPHRINE) For severe allergic reaction   #1 x prn   Entered and Authorized by:   ClinDeneise Lever  Signed by:   ClinDeneise Leveron 10/02/2009   Method used:   Print then Give to Patient   RxID:   [id number]-ALBUTEROL 0.5-2.5 (3) MG/3ML SOLN (IPRATROPIUM-ALBUTEROL) 1 neb four times a day as needed  #120 x prn   Entered and Authorized by:   ClinDeneise Lever  Signed by:   ClinDeneise Leveron 10/02/2009   Method used:   Print then Give to Patient   RxID:   :   [id number]. 400 400 MG TABS (ERYTHROMYCIN ETHYLSUCCINATE) 1 daily afteer meal  #30 x 1   Entered and Authorized by:   ClinDeneise Lever  Signed by:   ClinDeneise Leveron 10/02/2009   Method used:  Print then Give to Patient   RxID:   [id number] E.E.S. 400 400 MG TABS (ERYTHROMYCIN ETHYLSUCCINATE) 1 daily after meal  #7 x 0   Entered and Authorized by:   Deneise Lever MD   Signed by:   Deneise Lever MD on 10/02/2009   Method used:   Print then Give to Patient   RxID:   [id number] MYCELEX 10 MG TROC (CLOTRIMAZOLE) melt in mouth twice daily  #60 x prn   Entered and Authorized by:   Deneise Lever MD   Signed by:   Deneise Lever MD on 10/02/2009   Method used:   Electronically to        [id number] (retail)       [address]       [place], [place]  [address]       Ph: [phone] or [phone]       Fax: [phone]   RxID:   [id number] FLUCONAZOLE 150 MG TABS (FLUCONAZOLE) 1 daily  #20 x 3   Entered and Authorized by:   Deneise Lever MD   Signed by:   Deneise Lever MD on 10/02/2009   Method used:   Electronically to        [facility]. #[id number]* (retail)       [address]       [place], [place]  [address]       Ph: [phone] or [phone]       Fax: [phone]   RxID:   [id number] BENZONATATE 100 MG CAPS (BENZONATATE) take 1 capsule four times a day as needed  #100 x 5   Entered and Authorized by:   Deneise Lever MD   Signed  by:   Deneise Lever MD on 10/02/2009   Method used:   Electronically to        [facility]. #[id number]* (retail)       [address]       [place],   [address]       Ph: [phone] or [phone]       Fax: [phone]   RxID:   [id number]

## 2010-08-17 NOTE — Progress Notes (Signed)
Summary: Nasal drainage  Phone Note Call from Patient Call back at Home Phone [phone]   Caller: Patient Call For: young Summary of Call: Spoke with pt about CT Sinus' results; pt states she is still suffering with nasal drainage and white pus in the back of her throat- she is able to cough it up each morning but is just having a hard time. Please advise. Initial call taken by: Clayborne Dana CMA,  March 10, 2008 9:19 AM  Follow-up for Phone Call        Please offer sample Patanase nasal spray, 1-2 puffs each nostril up to twice daily as needed. She can come by and pick it up. Follow-up by: Deneise Lever MD,  March 10, 2008 11:52 AM  Additional Follow-up for Phone Call Additional follow up Details #1::        Called spoke with pt.  Advised of CY recommendations.  Will come by and pick up sample.  ! box left up front for patient pick-up. Additional Follow-up by: Freddrick March RN,  March 10, 2008 12:06 PM

## 2010-08-17 NOTE — Progress Notes (Signed)
Summary: sore throat  Phone Note Call from Patient   Caller: Patient Call For: young Summary of Call: pt c/o sore throat/ white in color x 4 days. no fever, chills or other complaints. has an appt tomorrow w/ dr young. refused appt w/ tp today in hp office. requests to speak to nurse re: condition. [facility] [address] [phone] Initial call taken by: Cooper Render,  July 03, 2008 10:24 AM  Follow-up for Phone Call        Pt c/o sore throat x 1 wk.  She states that it looks white instead of red.  She states that she has had trush before and this is different.  Denies any other complaints.  I advised for her to keep her appt with CDY for tommorrow to be evaluated.  She agrees and will keep appt. Follow-up by: Tilden Dome,  July 03, 2008 10:55 AM

## 2010-08-17 NOTE — Progress Notes (Signed)
Summary: prescript  Phone Note Call from Patient   Caller: Patient Call For: young Summary of Call: need prescript for symbicort 160/ 4.5 called to pharmacy had samples only [facility] [phone] Initial call taken by: Gustavus Bryant,  May 29, 2008 4:32 PM  Follow-up for Phone Call        pt saw CY on 05-15-08 and was given samples of Symbicort 160/4.5  Per patient instructions: pt could return back to taking the 80/4.5 after taking the samples of the 160/4.5 or call for an rx for the 160/4.5.  Pt requesting rx for 160/4.5  called and spoke with pt and informed her rx sent to pharmacy.  pt also wanted me to update her med list for her as she forgot to add a med when she last saw CY:  Fenofibrate 197m Take 1 tablet by mouth once a day MValrie HartLPN  November 12, 220264:47 PM     New/Updated Medications: SYMBICORT 160-4.5 MCG/ACT  AERO (BUDESONIDE-FORMOTEROL FUMARATE) Two puffs twice daily FENOFIBRATE MICRONIZED 134 MG CAPS (FENOFIBRATE MICRONIZED) Take 1 tablet by mouth once a day   Prescriptions: SYMBICORT 160-4.5 MCG/ACT  AERO (BUDESONIDE-FORMOTEROL FUMARATE) Two puffs twice daily  #1 x 6   Entered by:   MValrie HartLPN   Authorized by:   CDeneise LeverMD   Signed by:   MValrie HartLPN on 169/16/7561  Method used:   Electronically to        [id number] (retail)       [address]       [place]   [id number]      Ph: [phone] [phone]      Fax: [phone]  RxID:   [id number]

## 2010-08-17 NOTE — Assessment & Plan Note (Signed)
Summary: rov 2 months///kp   Primary Provider/Referring Provider:  Viona Gilmore. Elkins/ Truslow  CC:  2 month follow up visit-allergies; strachy throat feeling x 2-3 days.Marland Kitchen  History of Present Illness:  July 21, 2009- Allergic rhinitis, rhinosinusitis, asthmatic bronchitis, Rheumatoid arthritis After last here, she says cough never really went away. Feels patch of thrush. [place]. Did get flu shot and has had pneumovax twice. Phlegm won't come up easily.Wheezes. Denies chest pain. May have had some fever. Denies feeling reflux.   August 18, 2009- Allergic rhinitis, rhinosinusitis, asthmatic bronchitis, rheumatoid arthritis Thought she was better after last here, but again hoarse with yelloow/ green nasal drainage. Comes from throat- not chest. Gargles and uses Neti pot  less wheeze- chest is better. She asks about retrying diflucan with an antibiotic.  October 02, 2009- Allergic rhinitis, rhinosinusitis, asthmatic bronchitis, rheumatoid arthritis She is acutely ill and feels she is sick more often. Husband is on Zpak for cough and sore throat. Again noting yellow, sometimes green nasal discharge, throat discomfort, some cough.. She will be moving and they have been stirring up dust as they start the moving process.She describes steady exposure to dust, paint fumes and related irritants as they get the house ready. She felt best while on both antibioitic and diflucan. Some cough and a little wheeze. Describes thick sputum.Thinks she's had some fever.  Dec 02, 2009- Allergic rhinitis, rhinosinusitis, asthmatic bronchitis, rheumatoid arthritis It took her a month after last here to really feel better. She feels maintenance erythromycin has helped a lot. She had a long interval of productive cough last month as if she was getting cleared out. She is near end of fixing old house with associated dust, and will move in July. Plans to keep her 34 yo grandaughter a lot this summer. We discussed  potential for catching more colds. She continues two times a day Mycelex troches to suppress what she feels is recurrent yeast.  Dr Charlestine Night put her on prednisone for 3 weeks, now off about 3 weeks. It helped arthritic swelling in hands and feet.    Current Medications (verified): 1)  Singulair 10 Mg  Tabs (Montelukast Sodium) .... One By Mouth Once Daily 2)  Verapamil Hcl Cr 240 Mg  Tbcr (Verapamil Hcl) .... One By Mouth Once Daily 3)  Klor-Con 20 Meq Pack (Potassium Chloride) .... Take 1 Tablet By Mouth Four Times A Day 4)  Triamterene-Hctz 75-50 Mg  Tabs (Triamterene-Hctz) .... Take 1-2  Tablet By Mouth Once A Day 5)  Clidinium-Chlordiazepoxide 2.5-5 Mg  Caps (Clidinium-Chlordiazepoxide) .... Three Times A Day 6)  Nexium 40 Mg  Cpdr (Esomeprazole Magnesium) .... Take 1 By Mouth Once Daily 7)  Flonase 50 Mcg/act  Susp (Fluticasone Propionate) .... 2 Puffs in A.m. 8)  Vitamin D 50000 Unit  Caps (Ergocalciferol) .... One By Mouth Every Two Weeks 9)  Fosamax Plus D 70-5600 Mg-Unit  Tabs (Alendronate-Cholecalciferol) .... Once A Week 10)  Proair Hfa 108 (90 Base) Mcg/act Aers (Albuterol Sulfate) .... 2 Puffs Every 4-6 Hours As Needed 11)  Allergy Vaccine 1:10 Go (W-E) .... Once A Week 12)  Epipen 2-Pak 0.3 Mg/0.83m Devi (Epinephrine) .... For Severe Allergic Reaction 13)  Celebrex 200 Mg  Caps (Celecoxib) .... Take 1 Tablet By Mouth Once A Day 14)  Ipratropium-Albuterol 0.5-2.5 (3) Mg/376mSoln (Ipratropium-Albuterol) ...Marland Kitchen 1 Neb Four Times A Day As Needed 15)  Fenofibrate Micronized 134 Mg Caps (Fenofibrate Micronized) .... Take 1 Capsule By Mouth Once A Day 16)  Glycopyrrolate 1 Mg Tabs (Glycopyrrolate) .Marland Kitchen.. 1 Tab By Mouth As Needed 17)  Centrum Silver  Tabs (Multiple Vitamins-Minerals) .... Take 1 Tablet By Mouth Once A Day 18)  Xanax 1 Mg Tabs (Alprazolam) .... Take 1 Tab By Mouth At Bedtime 19)  Voltaren 1 % Gel (Diclofenac Sodium) .... Apply To Affected Area Four Times A Day 20)  Prozac 20  Mg Caps (Fluoxetine Hcl) .... Take 1 By Mouth Once Daily 21)  Tramadol Hcl 50 Mg Tabs (Tramadol Hcl) .... 2 Tablets Every 6 Hrs As Needed 22)  Bd Tb Syringe 27g X 1/2" 0.5 Ml Misc (Tuberculin-Allergy Syringes) .... Use To Give Allergy Vaccine or Other Medications As Directed 23)  Benzonatate 100 Mg Caps (Benzonatate) .... Take 1 Capsule Four Times A Day As Needed 24)  Chlorzoxazone 500 Mg Tabs (Chlorzoxazone) .... Take 1 By Mouth Every 6 Hours As Needed 25)  Allegra 180 Mg Tabs (Fexofenadine Hcl) .Marland Kitchen.. 1 Daily As Needed Antihistamine 26)  Fluconazole 150 Mg Tabs (Fluconazole) .Marland Kitchen.. 1 Daily 27)  Mycelex 10 Mg Troc (Clotrimazole) .... Melt in Mouth Twice Daily 28)  E.e.s. 400 400 Mg Tabs (Erythromycin Ethylsuccinate) .Marland Kitchen.. 1 Daily Afteer Meal 29)  Tussionex Pennkinetic Er 8-10 Mg/5m Lqcr (Chlorpheniramine-Hydrocodone) ..Marland Kitchen. 1 Teaspoon . Two Times A Day As Needed Cough 30)  Allegra-D 24 Hour 180-240 Mg Xr24h-Tab (Fexofenadine-Pseudoephedrine) .... Take 1 By Mouth  Every Morning  Allergies (verified): 1)  ! *Jerral Ralph Past History:  Past Medical History: Last updated: 03/03/2008 REFLUX, ESOPHAGEAL (ICD-530.81) ASTHMATIC BRONCHITIS, ACUTE (ICD-466.0) RHEUMATOID ARTHRITIS (ICD-714.0) ALLERGIC RHINITIS (ICD-477.9)    Past Surgical History: Last updated: 03/03/2008 Fusion left ankle- 2009  Family History: Last updated: 03/10/2008 Mother- living age 67 heart disease, arthritis. Father- deceased age 67 heart attack. Sibling 1- living age 67 DM, heart attack  Social History: Last updated: 03/10/2008 Patient never smoked.  Positive history of passive tobacco smoke exposure.  Exercise- at least 2 times weekly Caffeine-2 cups in morning ETOH-glass of wine each night. Married with 2 children.   Risk Factors: Smoking Status: never (03/03/2008) Passive Smoke Exposure: yes (03/10/2008)  Review of Systems      See HPI       The patient complains of shortness of breath with activity and  nasal congestion/difficulty breathing through nose.  The patient denies shortness of breath at rest, productive cough, non-productive cough, coughing up blood, chest pain, irregular heartbeats, acid heartburn, indigestion, loss of appetite, weight change, abdominal pain, difficulty swallowing, sore throat, tooth/dental problems, headaches, and sneezing.    Vital Signs:  Patient profile:   65year old female Height:      62 inches Weight:      154 pounds BMI:     28.27 O2 Sat:      98 % on Room air Pulse rate:   87 / minute BP sitting:   120 / 70  (left arm) Cuff size:   regular  Vitals Entered By: KClayborne DanaCMA (Dec 02, 2009 10:20 AM)  O2 Flow:  Room air  Physical Exam  Additional Exam:  General: A/Ox3; pleasant and cooperative, NAD, overweight, talkative,  SKIN: no rash, lesions NODES: no lymphadenopathy HEENT: [place]/AT, EOM- WNL, Conjuctivae- clear, PERRLA, TM-WNL, Nose- clear, Throat-  no thrush, red on soft palate.,  NECK: Supple w/ fair ROM, JVD- none, normal carotid impulses w/o bruits Thyroid- CHEST: Clear to P&A, l HEART: RRR, no m/g/r heard EBTD:HRCB nl pulses, no edema  NEURO: Grossly intact to observation  Impression & Recommendations:  Problem # 1:  ALLERGIC RHINITIS (ICD-477.9)  Control is pretty good. Concern about her scratchy throat, but not specific. She asks refill Tussionex.Allegra -D is helping in AM with Allegra 180 taken at night. Admits not using Flonase  regularly. We will continue the troches and try nasalcrom. Her updated medication list for this problem includes:    Flonase 50 Mcg/act Susp (Fluticasone propionate) .Marland Kitchen... 2 puffs in a.m.    Allegra 180 Mg Tabs (Fexofenadine hcl) .Marland Kitchen... 1 daily as needed antihistamine    Nasalcrom 5.2 Mg/act Aers (Cromolyn sodium) .Marland Kitchen... 1-2 sprays each nostril four times a day / as directed  Problem # 2:  TRACHEOBRONCHITIS (ICD-490)  Variable cough is noted at times. The following medications were removed from  the medication list:    E.e.s. 400 400 Mg Tabs (Erythromycin ethylsuccinate) .Marland Kitchen... 1 daily after meal Her updated medication list for this problem includes:    Singulair 10 Mg Tabs (Montelukast sodium) ..... One by mouth once daily    Proair Hfa 108 (90 Base) Mcg/act Aers (Albuterol sulfate) .Marland Kitchen... 2 puffs every 4-6 hours as needed    Ipratropium-albuterol 0.5-2.5 (3) Mg/27m Soln (Ipratropium-albuterol) ..Marland Kitchen.. 1 neb four times a day as needed    Benzonatate 100 Mg Caps (Benzonatate) ..Marland Kitchen.. Take 1 capsule four times a day as needed    E.e.s. 400 400 Mg Tabs (Erythromycin ethylsuccinate) ..Marland Kitchen.. 1 daily afteer meal    Tussionex Pennkinetic Er 8-10 Mg/513mLqcr (Chlorpheniramine-hydrocodone) ...Marland Kitchen. 1 teaspoon . two times a day as needed cough    Allegra-d 24 Hour 180-240 Mg Xr24h-tab (Fexofenadine-pseudoephedrine) ...Marland Kitchen. Take 1 by mouth  every morning    Tussionex Pennkinetic Er 8-10 Mg/43m57mqcr (Chlorpheniramine-hydrocodone) ....Marland Kitchen 1 teaspoon two times a day as needed cough  Medications Added to Medication List This Visit: 1)  Allegra-d 24 Hour 180-240 Mg Xr24h-tab (Fexofenadine-pseudoephedrine) .... Take 1 by mouth  every morning 2)  Nasalcrom 5.2 Mg/act Aers (Cromolyn sodium) ....Marland Kitchen1-2 sprays each nostril four times a day / as directed 3)  Tussionex Pennkinetic Er 8-10 Mg/43ml70mcr (Chlorpheniramine-hydrocodone) .... Marland Kitchen teaspoon two times a day as needed cough  Other Orders: Est. Patient Level III ([id number] Created Electronically ([phone] Instructions: 1)  Please schedule a follow-up appointment in 2 months. 2)  Refills for Tussionex- printed, and for other meds- sent. 3)  Try otc nasal antinflammatory spray Nasalcrom/ cromolyn Prescriptions: TUSSIONEX PENNKINETIC ER 8-10 MG/5ML LQCR (CHLORPHENIRAMINE-HYDROCODONE) 1 teaspoon two times a day as needed cough  #300 ml x 0   Entered and Authorized by:   ClinDeneise Lever  Signed by:   ClinDeneise Leveron 12/02/2009   Method used:    Print then Give to Patient   RxID:   [id number] 50 MCG/ACT  SUSP (FLUTICASONE PROPIONATE) 2 puffs in a.m.  #16 Gram x prn   Entered and Authorized by:   ClinDeneise Lever  Signed by:   ClinDeneise Leveron 12/02/2009   Method used:   [facility] to        [id number]       [address]    [place]  [id number]   Ph: [phone]   Fax: [phone]:   [id number].S. 400 400 MG TABS (ERYTHROMYCIN ETHYLSUCCINATE) 1 daily afteer meal  #  30 x 5   Entered and Authorized by:   Deneise Lever MD   Signed by:   Deneise Lever MD on 12/02/2009   Method used:   Electronically to        [id number] (retail)       [address]       [place], [place]  [address]       Ph: [phone] or [phone]       Fax: [phone]   RxID:   [id number]

## 2010-08-17 NOTE — Letter (Signed)
Summary: CMN/nebulizer/[facility]  CMN/nebulizer/[facility]   Imported By: Bubba Hales 04/28/2008 10:18:13  _____________________________________________________________________  External Attachment:    Type:   Image     Comment:   External Document

## 2010-08-17 NOTE — Assessment & Plan Note (Signed)
Summary: green mucus with cough/ mbw   Primary Provider/Referring Provider:  W. Elkins/ Truslow  CC:  cough-prod; yellow and nasal drainage-green x 2 weeks; suffering with allergies lately. Marland Kitchen  History of Present Illness:  08/13/08- Allergic rhinitis, asthmaticbronchitis, RA Stressed- mother GI bleed/diverticulosis. Got run down and chilled caring then for mother. Was feeling well until 2 days ago woke from sleep with cough. Still some mild postnasal drainage, coughs some mucus. Going to [place] in March.  09/16/08- Allergic rhinitis, asthmatic bronchitis Going to [place] in a few days. Has not started a pred taper Dr Charlestine Night gave her for the trip.  3 days ago began getting hoarse, with green from nose and chest with Neti pot and nebulizer.Doubts fever but throat feels irritated. Daughter has "bacterial throat infection". Had Norovirus GI, now resolved.  12/19/08- Allergic rhinitis, rhinosinusitis, asthmatic bronchitis, rheumatoid arthritis Needed antibiotic and diflucan while in [place], but did well for awhile on return. In last 2-3 weeks - cough with yellow, getting worse and waking her at night, nose and throat pressure and drainage, no fever. Neti pot brings out green. Exposed to a lot of Amias Hutchinson grandchildren with coughs etc.    Current Medications (verified): 1)  Singulair 10 Mg  Tabs (Montelukast Sodium) .... One By Mouth Once Daily 2)  Claritin 10 Mg Tabs (Loratadine) .... Take 1 By Mouth Once Daily 3)  Verapamil Hcl Cr 240 Mg  Tbcr (Verapamil Hcl) .... One By Mouth Once Daily 4)  Klor-Con 20 Meq Pack (Potassium Chloride) .... Take 1 Tablet By Mouth Four Times A Day 5)  Triamterene-Hctz 75-50 Mg  Tabs (Triamterene-Hctz) .... Take 1-2  Tablet By Mouth Once A Day 6)  Clidinium-Chlordiazepoxide 2.5-5 Mg  Caps (Clidinium-Chlordiazepoxide) .... Three Times A Day 7)  Nexium 40 Mg  Cpdr (Esomeprazole Magnesium) .... Take 1 By Mouth Once Daily 8)  Flonase 50 Mcg/act  Susp (Fluticasone Propionate)  .... 2 Puffs in A.m. 9)  Vitamin D 50000 Unit  Caps (Ergocalciferol) .... One By Mouth Every Two Weeks 10)  Fosamax Plus D 70-5600 Mg-Unit  Tabs (Alendronate-Cholecalciferol) .... Once A Week 11)  Symbicort 160-4.5 Mcg/act  Aero (Budesonide-Formoterol Fumarate) .... Two Puffs Twice Daily 12)  Proair Hfa 108 (90 Base) Mcg/act Aers (Albuterol Sulfate) .... 2 Puffs Every 4-6 Hours As Needed 13)  Allergy Vaccine 1:10 Go (W-E) .... Once A Week 14)  Epipen 2-Pak 0.3 Mg/0.40m (1:1000)  Devi (Epinephrine Hcl (Anaphylaxis)) .... For Severe Allergic Reaction 15)  Celebrex 200 Mg  Caps (Celecoxib) .... Take 1 Tablet By Mouth Once A Day 16)  Ipratropium-Albuterol 0.5-2.5 (3) Mg/311mSoln (Ipratropium-Albuterol) ...Marland Kitchen 1 Neb Four Times A Day As Needed 17)  Fenofibrate Micronized 134 Mg Caps (Fenofibrate Micronized) .... Take 1 Capsule By Mouth Once A Day 18)  Glycopyrrolate 1 Mg Tabs (Glycopyrrolate) ...Marland Kitchen 1 Tab By Mouth As Needed 19)  Centrum Silver  Tabs (Multiple Vitamins-Minerals) .... Take 1 Tablet By Mouth Once A Day 20)  Xanax 1 Mg Tabs (Alprazolam) .... Take 1 Tab By Mouth At Bedtime 21)  Voltaren 1 % Gel (Diclofenac Sodium) .... Apply To Affected Area Four Times A Day 22)  Paxil 20 Mg Tabs (Paroxetine Hcl) .... Take 1 1/2 Tabs By Mouth Daily 23)  Tussionex Pennkinetic Er 8-10 Mg/73m51mqcr (Chlorpheniramine-Hydrocodone) ....Marland Kitchen1 Tsp Two Times A Day Prn 24)  Tramadol Hcl 50 Mg Tabs (Tramadol Hcl) .... 2 Tablets Every 6 Hrs As Needed 25)  Clarithromycin 250 Mg Tabs (Clarithromycin) ....Marland Kitchen1 Two Times A  Day Before Meals 26)  Fluconazole 100 Mg Tabs (Fluconazole) .... Take 1 Tablet By Mouth Once A Day X 7 Days 27)  Bd Tb Syringe 27g X 1/2" 0.5 Ml Misc (Tuberculin-Allergy Syringes) .... Use To Give Allergy Vaccine or Other Medications As Directed 28)  Benzonatate 100 Mg Caps (Benzonatate) .... Take 1 Capsule Four Times A Day As Needed 29)  Chlorzoxazone 500 Mg Tabs (Chlorzoxazone) .... Take 1 By Mouth Every 6  Hours As Needed  Allergies (verified): 1)  Erythromycin  Past History:  Past Medical History: Last updated: 03/03/2008 REFLUX, ESOPHAGEAL (ICD-530.81) ASTHMATIC BRONCHITIS, ACUTE (ICD-466.0) RHEUMATOID ARTHRITIS (ICD-714.0) ALLERGIC RHINITIS (ICD-477.9)    Past Surgical History: Last updated: 03/03/2008 Fusion left ankle- 2009  Family History: Last updated: 03/10/2008 Mother- living age 36; heart disease, arthritis. Father- deceased age 55; heart attack. Sibling 1- living age 79; DM, heart attack  Social History: Last updated: 03/10/2008 Patient never smoked.  Positive history of passive tobacco smoke exposure.  Exercise- at least 2 times weekly Caffeine-2 cups in morning ETOH-glass of wine each night. Married with 2 children.   Risk Factors: Smoking Status: never (03/03/2008) Passive Smoke Exposure: yes (03/10/2008)  Review of Systems      See HPI  The patient denies fever, weight loss, hoarseness, chest pain, syncope, headaches, hemoptysis, melena, and severe indigestion/heartburn.    Vital Signs:  Patient profile:   65 year old female Weight:      178 pounds O2 Sat:      98 % on Room air Pulse rate:   84 / minute BP sitting:   120 / 68  (left arm) Cuff size:   regular  Vitals Entered By: Clayborne Dana CMA (December 19, 2008 3:21 PM)  O2 Flow:  Room air CC: cough-prod; yellow and nasal drainage-green x 2 weeks; suffering with allergies lately.  Comments Medications reviewed with patient Clayborne Dana CMA  December 19, 2008 3:22 PM    Physical Exam  Additional Exam:  General: A/Ox3; pleasant and cooperative, NAD, overweight, talkative SKIN: no rash, lesions NODES: no lymphadenopathy HEENT: [place]/AT, EOM- WNL, Conjuctivae- clear, PERRLA, TM-WNL, Nose- clear, Throat-  tongue, mild hoarse, mucus in nose NECK: Supple w/ fair ROM, JVD- none, normal carotid impulses w/o bruits Thyroid- CHEST: Clear to P&A, clear today HEART: RRR, no m/g/r heard NIO:EVOJ, nl pulses,  no edema  NEURO: Grossly intact to observation      Impression & Recommendations:  Problem # 1:  RHINOSINUSITIS, 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 Will treat as has worked before, with antibiotic, steroid inj and diflucan.  Problem # 2:  ASTHMATIC BRONCHITIS, ACUTE (ICD-466.0)  Early exacerbation. Treatment for sinuses may help as above. Her updated medication list for this problem includes:    Singulair 10 Mg Tabs (Montelukast sodium) ..... One by mouth once daily    Symbicort 160-4.5 Mcg/act Aero (Budesonide-formoterol fumarate) .Marland Kitchen..Marland Kitchen Two puffs twice daily    Proair Hfa 108 (90 Base) Mcg/act Aers (Albuterol sulfate) .Marland Kitchen... 2 puffs every 4-6 hours as needed    Ipratropium-albuterol 0.5-2.5 (3) Mg/35m Soln (Ipratropium-albuterol) ..Marland Kitchen.. 1 neb four times a day as needed    Tussionex Pennkinetic Er 8-10 Mg/548mLqcr (Chlorpheniramine-hydrocodone) ...Marland Kitchen. 1 tsp two times a day prn    Clarithromycin 250 Mg Tabs (Clarithromycin) ...Marland Kitchen. 1 two times a day before meals    Benzonatate 100 Mg Caps (Benzonatate) ...Marland Kitchen. Take 1 capsule four times a day as needed    Cefdinir 300 Mg Caps (Cefdinir) ...Marland Kitchen. 2 daily  Orders: Est.  Patient Level III (31674)  Medications Added to Medication List This Visit: 1)  Claritin 10 Mg Tabs (Loratadine) .... Take 1 by mouth once daily 2)  Triamterene-hctz 75-50 Mg Tabs (Triamterene-hctz) .... Take 1-2  tablet by mouth once a day 3)  Chlorzoxazone 500 Mg Tabs (Chlorzoxazone) .... Take 1 by mouth every 6 hours as needed 4)  Cefdinir 300 Mg Caps (Cefdinir) .... 2 daily 5)  Fluconazole 150 Mg Tabs (Fluconazole) .Marland Kitchen.. 1 daily  Other Orders: Admin of Therapeutic Inj  intramuscular or subcutaneous (25525) Depo- Medrol 39m ([id number]  Patient Instructions: 1)  Please schedule a follow-up appointment in 4 months. 2)  scripts for antibiotic and diflucan sent to your drug store 3)  depo 40 Prescriptions: FLUCONAZOLE 150 MG TABS (FLUCONAZOLE) 1 daily  #7 x 1    Entered and Authorized by:   CDeneise LeverMD   Signed by:   CDeneise LeverMD on 12/19/2008   Method used:   Electronically to        [id number] ([facility]       [address]       [place]  [address]      Ph: [phone]      Fax: [phone]  RxID:   [id number] 300 MG CAPS (CEFDINIR) 2 daily  #14 x 1   Entered and Authorized by:   CDeneise LeverMD   Signed by:   CDeneise LeverMD on 12/19/2008   Method used:   Electronically to        [id number] (retail)       [address] [place]  [address]      Ph: [phone]      Fax: [phone]  RxID:   [id number]   Medication Administration  Injection # 1:    Medication: Depo- Medrol 490m   Diagnosis: RHINOSINUSITIS, 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 Patient Level III [9[44715])  Admin of Therapeutic Inj  intramuscular or subcutaneous [96372] 3)  Depo- Medrol 4067mJ1[A0638]

## 2010-08-17 NOTE — Assessment & Plan Note (Signed)
Summary: cough/drainage/apc   Primary Provider/Referring Provider:  W. Elkins/ Truslow  CC:  Accute visit-cough and drainage.Marland Kitchen  History of Present Illness:  October 02, 2009- Allergic rhinitis, rhinosinusitis, asthmatic bronchitis, rheumatoid arthritis She is acutely ill and feels she is sick more often. Husband is on Zpak for cough and sore throat. Again noting yellow, sometimes green nasal discharge, throat discomfort, some cough.. She will be moving and they have been stirring up dust as they start the moving process.She describes steady exposure to dust, paint fumes and related irritants as they get the house ready. She felt best while on both antibioitic and diflucan. Some cough and a little wheeze. Describes thick sputum.Thinks she's had some fever.  Dec 02, 2009- Allergic rhinitis, rhinosinusitis, asthmatic bronchitis, rheumatoid arthritis It took her a month after last here to really feel better. She feels maintenance erythromycin has helped a lot. She had a long interval of productive cough last month as if she was getting cleared out. She is near end of fixing old house with associated dust, and will move in July. Plans to keep her 75 yo grandaughter a lot this summer. We discussed potential for catching more colds. She continues two times a day Mycelex troches to suppress what she feels is recurrent yeast.  Dr Charlestine Night put her on prednisone for 3 weeks, now off about 3 weeks. It helped arthritic swelling in hands and feet.  December 24, 2009- Allergic rhinitis, rhinosinusitis, asthmatic bronchitis, rheumatoid artritis Can't shake the cough and chest congestion. Tried maintenance erythromycin but it just caused loose stools. Tussionex works. Denies any sense of reflux. hears some wheeze. Cough is productive yellow/ clear. needs CXR update.    Asthma History    Asthma Control Assessment:    Age range: 12+ years    Symptoms: 0-2 days/week    Nighttime Awakenings: 0-2/month    Interferes  w/ normal activity: no limitations    SABA use (not for EIB): >2 days/week    Asthma Control Assessment: Not Well Controlled   Preventive Screening-Counseling & Management  Alcohol-Tobacco     Smoking Status: never  Current Medications (verified): 1)  Singulair 10 Mg  Tabs (Montelukast Sodium) .... One By Mouth Once Daily 2)  Verapamil Hcl Cr 240 Mg  Tbcr (Verapamil Hcl) .... One By Mouth Once Daily 3)  Klor-Con 20 Meq Pack (Potassium Chloride) .... Take 1 Tablet By Mouth Four Times A Day 4)  Triamterene-Hctz 75-50 Mg  Tabs (Triamterene-Hctz) .... Take 1-2  Tablet By Mouth Once A Day 5)  Clidinium-Chlordiazepoxide 2.5-5 Mg  Caps (Clidinium-Chlordiazepoxide) .... Three Times A Day 6)  Nexium 40 Mg  Cpdr (Esomeprazole Magnesium) .... Take 1 By Mouth Once Daily 7)  Flonase 50 Mcg/act  Susp (Fluticasone Propionate) .... 2 Puffs in A.m. 8)  Vitamin D 50000 Unit  Caps (Ergocalciferol) .... One By Mouth Every Two Weeks 9)  Fosamax Plus D 70-5600 Mg-Unit  Tabs (Alendronate-Cholecalciferol) .... Once A Week 10)  Proair Hfa 108 (90 Base) Mcg/act Aers (Albuterol Sulfate) .... 2 Puffs Every 4-6 Hours As Needed 11)  Allergy Vaccine 1:10 Go (W-E) .... Once A Week 12)  Epipen 2-Pak 0.3 Mg/0.33m Devi (Epinephrine) .... For Severe Allergic Reaction 13)  Celebrex 200 Mg  Caps (Celecoxib) .... Take 1 Tablet By Mouth Once A Day 14)  Ipratropium-Albuterol 0.5-2.5 (3) Mg/351mSoln (Ipratropium-Albuterol) ...Marland Kitchen 1 Neb Four Times A Day As Needed 15)  Fenofibrate Micronized 134 Mg Caps (Fenofibrate Micronized) .... Take 1 Capsule By Mouth Once A  Day 16)  Glycopyrrolate 1 Mg Tabs (Glycopyrrolate) .Marland Kitchen.. 1 Tab By Mouth As Needed 17)  Centrum Silver  Tabs (Multiple Vitamins-Minerals) .... Take 1 Tablet By Mouth Once A Day 18)  Xanax 1 Mg Tabs (Alprazolam) .... Take 1 Tab By Mouth At Bedtime 19)  Voltaren 1 % Gel (Diclofenac Sodium) .... Apply To Affected Area Four Times A Day 20)  Prozac 20 Mg Caps (Fluoxetine Hcl)  .... Take 1 By Mouth Once Daily 21)  Tramadol Hcl 50 Mg Tabs (Tramadol Hcl) .... 2 Tablets Every 6 Hrs As Needed 22)  Bd Tb Syringe 27g X 1/2" 0.5 Ml Misc (Tuberculin-Allergy Syringes) .... Use To Give Allergy Vaccine or Other Medications As Directed 23)  Benzonatate 100 Mg Caps (Benzonatate) .... Take 1 Capsule Four Times A Day As Needed 24)  Chlorzoxazone 500 Mg Tabs (Chlorzoxazone) .... Take 1 By Mouth Every 6 Hours As Needed 25)  Allegra 180 Mg Tabs (Fexofenadine Hcl) .Marland Kitchen.. 1 Daily As Needed Antihistamine 26)  Fluconazole 150 Mg Tabs (Fluconazole) .Marland Kitchen.. 1 Daily 27)  Mycelex 10 Mg Troc (Clotrimazole) .... Melt in Mouth Twice Daily 28)  Allegra-D 24 Hour 180-240 Mg Xr24h-Tab (Fexofenadine-Pseudoephedrine) .... Take 1 By Mouth  Every Morning 29)  Nasalcrom 5.2 Mg/act Aers (Cromolyn Sodium) .Marland Kitchen.. 1-2 Sprays Each Nostril Four Times A Day / As Directed 30)  Tussionex Pennkinetic Er 8-10 Mg/66m Lqcr (Chlorpheniramine-Hydrocodone) ..Marland Kitchen. 1 Teaspoon Two Times A Day As Needed Cough  Allergies: 1)  ! * Oravig 2)  ! *Lowella Dell Past History:  Past Medical History: Last updated: 03/03/2008 REFLUX, ESOPHAGEAL (ICD-530.81) ASTHMATIC BRONCHITIS, ACUTE (ICD-466.0) RHEUMATOID ARTHRITIS (ICD-714.0) ALLERGIC RHINITIS (ICD-477.9)    Past Surgical History: Last updated: 03/03/2008 Fusion left ankle- 2009  Family History: Last updated: 03/10/2008 Mother- living age 67 heart disease, arthritis. Father- deceased age 170 heart attack. Sibling 1- living age 67 DM, heart attack  Social History: Last updated: 03/10/2008 Patient never smoked.  Positive history of passive tobacco smoke exposure.  Exercise- at least 2 times weekly Caffeine-2 cups in morning ETOH-glass of wine each night. Married with 2 children.   Risk Factors: Smoking Status: never (12/24/2009) Passive Smoke Exposure: yes (03/10/2008)  Review of Systems      See HPI       The patient complains of productive cough.  The patient  denies shortness of breath with activity, shortness of breath at rest, non-productive cough, coughing up blood, chest pain, irregular heartbeats, acid heartburn, indigestion, loss of appetite, weight change, abdominal pain, difficulty swallowing, sore throat, tooth/dental problems, headaches, nasal congestion/difficulty breathing through nose, sneezing, itching, ear ache, anxiety, depression, hand/feet swelling, joint stiffness or pain, rash, change in color of mucus, and fever.    Vital Signs:  Patient profile:   64year old female Height:      62 inches Weight:      154 pounds BMI:     28.27 O2 Sat:      96 % on Room air Pulse rate:   90 / minute BP sitting:   108 / 56  (left arm) Cuff size:   regular  Vitals Entered By: KClayborne DanaCMA (December 24, 2009 11:21 AM)  O2 Flow:  Room air CC: Accute visit-cough and drainage.   Physical Exam  Additional Exam:  General: A/Ox3; pleasant and cooperative, NAD, overweight, talkative,  SKIN: no rash, lesions NODES: no lymphadenopathy HEENT: [place]/AT, EOM- WNL, Conjuctivae- clear, PERRLA, TM-WNL, Nose- clear, Throat-  no thrush, red on soft palate., minimally coated  tongue NECK: Supple w/ fair ROM, JVD- none, normal carotid impulses w/o bruits Thyroid- CHEST: deep rhonchi without defintie wheeze, not coughing HEART: RRR, no m/g/r heard YKD:XIPJ, nl pulses, no edema  NEURO: Grossly intact to observation     Impression & Recommendations:  Problem # 1:  TRACHEOBRONCHITIS (ICD-490)  More of a lower airway bronchitis pattern now. Immunosuppresion related to her arthritis is likely to be part of the problem. We will update CXR and culture sputum. The following medications were removed from the medication list:    E.e.s. 400 400 Mg Tabs (Erythromycin ethylsuccinate) .Marland Kitchen... 1 daily afteer meal    Tussionex Pennkinetic Er 8-10 Mg/29m Lqcr (Chlorpheniramine-hydrocodone) ..Marland Kitchen.. 1 teaspoon . two times a day as needed cough Her updated medication list  for this problem includes:    Singulair 10 Mg Tabs (Montelukast sodium) ..... One by mouth once daily    Proair Hfa 108 (90 Base) Mcg/act Aers (Albuterol sulfate) ..Marland Kitchen.. 2 puffs every 4-6 hours as needed    Ipratropium-albuterol 0.5-2.5 (3) Mg/374mSoln (Ipratropium-albuterol) ...Marland Kitchen. 1 neb four times a day as needed    Benzonatate 100 Mg Caps (Benzonatate) ...Marland Kitchen. Take 1 capsule four times a day as needed    Allegra-d 24 Hour 180-240 Mg Xr24h-tab (Fexofenadine-pseudoephedrine) ...Marland Kitchen. Take 1 by mouth  every morning    Tussionex Pennkinetic Er 8-10 Mg/73m67mqcr (Chlorpheniramine-hydrocodone) ....Marland Kitchen 1 teaspoon two times a day as needed cough  Problem # 2:  THRUSH (ICD-112.0)  improved  Problem # 3:  REFLUX, ESOPHAGEAL (ICD-530.81) I continue concern that this is occasionally a break-through problem. We reinforced precautions. Her updated medication list for this problem includes:    Clidinium-chlordiazepoxide 2.5-5 Mg Caps (Clidinium-chlordiazepoxide) ....Marland Kitchen Three times a day    Nexium 40 Mg Cpdr (Esomeprazole magnesium) ....Marland Kitchen Take 1 by mouth once daily    Glycopyrrolate 1 Mg Tabs (Glycopyrrolate) ....Marland Kitchen 1 tab by mouth as needed  Other Orders: T-2 View CXR ([id number]. Patient Level IV (99(76734-Culture, Sputum & Gram Stain (87070/[id number])  Patient Instructions: 1)  Please schedule a follow-up appointment in 2 months. 2)  Lab 3)  A chest x-ray has been recommended.  Your imaging study may require preauthorization.  4)  depo 80

## 2010-08-17 NOTE — Progress Notes (Signed)
Summary: results  Phone Note Call from Patient Call back at Home Phone [phone]   Caller: Patient Call For: Alisa Stjames Summary of Call: pt wants results from tests.  Initial call taken by: Cooper Render, CNA,  December 29, 2009 2:30 PM  Follow-up for Phone Call        Spoke with pt.  She was last seen 6/9/  She states that her cough is much worse, waking her up at night.  Cough is prod with yellow sputum.  I gave her her cxr and sputum cx results as appended.  She wants to know what the next step is.  Please advise, thanks! allergic to oravig and e-mycin Follow-up by: Tilden Dome,  December 29, 2009 2:39 PM  Additional Follow-up for Phone Call Additional follow up Details #1::        Per CDY- give augmentin 8102m #14 take 1 by mouth two times a day no refills.KClayborne DanaCMA  December 29, 2009 3:16 PM   rx sent. verified pharmacy.  pt aware. JRandolph BingCMA  December 29, 2009 3:18 PM     New/Updated Medications: AUGMENTIN 875-125 MG TABS (AMOXICILLIN-POT CLAVULANATE) Take 1 tablet by mouth two times a day Prescriptions: AUGMENTIN 875-125 MG TABS (AMOXICILLIN-POT CLAVULANATE) Take 1 tablet by mouth two times a day  #14 x 0   Entered by:   JRandolph BingCMA   Authorized by:   CDeneise LeverMD   Signed by:   JRandolph BingCMA on 12/29/2009   Method used:   Electronically to        [id number] (retail)       [address]       [place]  [address]      Ph: [phone] [phone]      Fax: [phone]  RxID:   [id number]

## 2010-08-17 NOTE — Progress Notes (Signed)
Summary: singulair refill- wants asap > done  Phone Note Call from Patient Call back at Home Phone [phone]   Caller: Patient Call For: young Summary of Call: pt needs refill of singulair asap as she is leaving town in the morning. [facility] [address] [facility]. call pt at home # or cell [phone] Initial call taken by: Cooper Render, CNA,  June 23, 2010 3:03 PM  Follow-up for Phone Call        last ov w/ CDY 11.28.11.  last refill singulair 05/2009.  called spoke with patient, verified that she is requesting refills on her singulair.  rx sent to her verified pharmacy. Parke Poisson CNA/MA  June 23, 2010 4:06 PM     Prescriptions: SINGULAIR 10 MG  TABS (MONTELUKAST SODIUM) one by mouth once daily  #30 Tablet x 5   Entered by:   Parke Poisson CNA/MA   Authorized by:   Deneise Lever MD   Signed by:   Parke Poisson CNA/MA on 06/23/2010   Method used:   Electronically to        [facility]  [phone]* (retail)       [place], [place]  [address]       Ph: [phone] or [phone]       Fax: [phone]   RxID:   [id number]

## 2010-08-17 NOTE — Assessment & Plan Note (Signed)
Summary: PER PT CHECK OUT/CB   Visit Type:  Follow-up PCP:  W. Elkins/ Truslow  Chief Complaint:  follow up-cough & sinus drainage with hoarseness(using albuterol inhaler more at night).  History of Present Illness: Current Problems:  REFLUX, ESOPHAGEAL (ICD-530.81) ASTHMATIC BRONCHITIS, ACUTE (ICD-466.0) RHEUMATOID ARTHRITIS (ICD-714.0) ALLERGIC RHINITIS (ICD-477.9)  [name] is using a Neti pot to rinse her nose, and says that does help.  She was on Omnicef and then Avelox as antibiotics in February.  She did very well through the rest of the winter, but now the spring pollen season is bothering her.  She takes Allegra-D, only in the mornings, to avoid insomnia.  She likes benzonatate pearls for cough control.  Albuterol does help when she wheezes.  She is out of Tussionex.       Current Allergies (reviewed today): ERYTHROMYCIN  Past Medical History:    Reviewed history from 05/12/2007 and no changes required:       Allergic rhinitis       Rheumatoid arthritis   Social History:    Reviewed history from 09/07/2007 and no changes required:       Patient never smoked.     Review of Systems      See HPI   Vital Signs:  Patient Profile:   65 Years Old Female Weight:      172 pounds O2 Sat:      97 % O2 treatment:    Room Air Pulse rate:   98 / minute BP sitting:   126 / 80  (right arm) Cuff size:   regular  Vitals Entered By: Clayborne Dana CMA (November 06, 2007 1:41 PM)             Comments Medications reviewed with patient  ..................................................................Marland KitchenClayborne Dana CMA  November 06, 2007 1:41 PM      Physical Exam  General:     she is putting on weight again.  She looks tired, partly because of bags under her eyes. Ears:     TMs intact and clear with normal canals Nose:     not congested. Mouth:     pharynx is not injected. Neck:     no JVD.   Lungs:     decreased BS bilateral.  I do not hear rate, rales,  wheeze, or rhonchi today.  Heart:     regular rate and rhythm, S1, S2 without murmurs, rubs, gallops, or clicks Cervical Nodes:     no significant adenopathy Axillary Nodes:     no significant adenopathy     Impression & Recommendations:  Problem # 1:  ALLERGIC RHINITIS (ICD-477.9) this is a seasonal pollen allergy problem, but I can't exclude some associated sinusitis.  We are going to manage it as allergy, but at her request, and after discussion, I am going to give her a standby prescription for some Avelox.  We have discussed available.  Medicines and have compared Serotec with Allegra-D.  She will compare these and see which works best. Her updated medication list for this problem includes:    Flonase 50 Mcg/act Susp (Fluticasone propionate) .Marland Kitchen... 2 puffs in a.m.    Zyrtec Allergy 10 Mg Tabs (Cetirizine hcl) .Marland Kitchen... Take 1 by mouth at bedtime  Orders: Est. Patient Level III (91791)   Problem # 2:  ASTHMATIC BRONCHITIS, ACUTE (ICD-466.0) Assessment: Unchanged  Her updated medication list for this problem includes:    Singulair 10 Mg Tabs (Montelukast sodium) ..... One by mouth qd    Allegra-d  12 Hour 60-120 Mg Tb12 (Fexofenadine-pseudoephedrine) ..... Every morning    Symbicort 80-4.5 Mcg/act Aero (Budesonide-formoterol fumarate) .Marland Kitchen..Marland Kitchen Two puffs bid    Albuterol 90 Mcg/act Aers (Albuterol) .Marland Kitchen... Prn    Tussionex Pennkinetic Er 8-10 Mg/81m Lqcr (Chlorpheniramine-hydrocodone) ..Marland Kitchen.. As needed    Avelox 400 Mg Tabs (Moxifloxacin hcl) ..Marland Kitchen.. 1 daily x 7 days    Benzonatate 100 Mg Caps (Benzonatate) ..Marland Kitchen.. 1 four times a day as needed  Orders: Est. Patient Level III ((94503   Medications Added to Medication List This Visit: 1)  Amitriptyline Hcl 75 Mg Tabs (Amitriptyline hcl) .... Take 2 by mouth at bedtime (per elkins) 2)  Allegra-d 12 Hour 60-120 Mg Tb12 (Fexofenadine-pseudoephedrine) .... Every morning 3)  Nexium 40 Mg Cpdr (Esomeprazole magnesium) .... Take 1 by mouth once  daily 4)  Zyrtec Allergy 10 Mg Tabs (Cetirizine hcl) .... Take 1 by mouth at bedtime 5)  Mobic 15 Mg Tabs (Meloxicam) .... Take 1 by mouth once daily   Patient Instructions: 1)  Please schedule a follow-up appointment in 4 months. 2)  Avelox and Allegra -D 12hr sent to your drug store 3)  Try taking Allegra -d in the morning and Zyrtec (otc) in the evening.    Prescriptions: BENZONATATE 100 MG  CAPS (BENZONATATE) 1 four times a day as needed  #50 x prn   Entered and Authorized by:   CDeneise LeverMD   Signed by:   CDeneise LeverMD on 11/06/2007   Method used:   [facility] ...       [facility]  [address] [id number]       [address]      [place]  [address]      Ph: [phone] [phone]      Fax: [phone]  RxID:   [id number] 400 MG  TABS (MOXIFLOXACIN HCL) 1 daily x 7 days  #7 x 0   Entered and Authorized by:   CDeneise LeverMD   Signed by:   CDeneise LeverMD on 11/06/2007   Method used:   Elect[facility] to ...       [facility]  [address] [id number]       [address]      [place]  [address]      Ph: [phone] [phone]      Fax: [phone]  RxID:   [id number]-D 12 HOUR 60-120 MG  TB12 (FEXOFENADINE-PSEUDOEPHEDRINE) every morning  #30 x prn   Entered and Authorized by:   CDeneise LeverMD   Signed by:   CDeneise LeverMD on 11/06/2007   Method used:   [facility] ...       [facility]  [address] [id number]       [address]      [place]  [address]      Ph: [phone] [phone]      Fax: [phone]  RxID:   [id number] ]

## 2010-08-17 NOTE — Assessment & Plan Note (Signed)
Summary: FU 4 MONTHS///KWP  Medications Added METHOCARBAMOL 500 MG  TABS (METHOCARBAMOL) take 1 every 8 hours as needed AVELOX 400 MG  TABS (MOXIFLOXACIN HCL) 1 daily x 7 days DIFLUCAN 150 MG  TABS (FLUCONAZOLE) 1 daily BENZONATATE 100 MG  CAPS (BENZONATATE) 1 four times a day as needed * ALLERGY VACCINE 1:10 GO (W-E)  EPIPEN 2-PAK 0.3 MG/0.3ML (1:1000)  DEVI (EPINEPHRINE HCL (ANAPHYLAXIS)) For severe allergic reaction        Visit Type:  Follow-up PCP:  W. Elkins/ Truslow  Chief Complaint:  follow up.  History of Present Illness: Current Problems:  REFLUX, ESOPHAGEAL (ICD-530.81) ASTHMATIC BRONCHITIS, ACUTE (ICD-466.0) RHEUMATOID ARTHRITIS (ICD-714.0) ALLERGIC RHINITIS (ICD-477.9)   We had called in [place] for sinusitis in mid-Feb. She says she was doing quite well before that. Is using neti Pot some. Got a steroid injection in shoulder. With recent head congestion she has also noted some dyspnea on stairs, but no wheezing. Some postnasal drip.       Current Allergies (reviewed today): ERYTHROMYCIN  Past Medical History:    Reviewed history from 05/12/2007 and no changes required:       Allergic rhinitis       Rheumatoid arthritis   Social History:    Reviewed history and no changes required:       Patient never smoked.    Risk Factors:  Tobacco use:  never    Vital Signs:  Patient Profile:   65 Years Old Female Weight:      165.13 pounds O2 Sat:      93 % O2 treatment:    Room Air Pulse rate:   93 / minute BP sitting:   122 / 72  (left arm) Cuff size:   regular  Vitals Entered By: Clayborne Dana CMA (September 07, 2007 11:40 AM)             Comments Medications reviewed with patient  ..................................................................Marland KitchenClayborne Dana CMA  September 07, 2007 11:40 AM      Physical Exam  General:     healthy appearing and obese.   Head:     normocephalic and atraumatic Eyes:     Mildly proptotic Ears:     TMs  intact and clear with normal canals Nose:     no deformity, discharge, inflammation, or lesions Mouth:     no deformity or lesions Neck:     no masses, thyromegaly, or abnormal cervical nodes Lungs:     few minimal squeaks Heart:     regular rate and rhythm, S1, S2 without murmurs, rubs, gallops, or clicks     Impression & Recommendations:  Problem # 1:  ASTHMATIC BRONCHITIS, ACUTE (ICD-466.0) Assessment: Deteriorated Avelox and neb xopenex with stand-by diflucan. The following medications were removed from the medication list:    Omnicef 300 Mg Caps (Cefdinir) .Marland Kitchen... 2 by mouth once daily  Her updated medication list for this problem includes:    Singulair 10 Mg Tabs (Montelukast sodium) ..... One by mouth qd    Allegra-d 12 Hour 60-120 Mg Tb12 (Fexofenadine-pseudoephedrine) .Marland Kitchen..Marland Kitchen Two times a day    Symbicort 80-4.5 Mcg/act Aero (Budesonide-formoterol fumarate) .Marland Kitchen..Marland Kitchen Two puffs bid    Albuterol 90 Mcg/act Aers (Albuterol) .Marland Kitchen... Prn    Tussionex Pennkinetic Er 8-10 Mg/70m Lqcr (Chlorpheniramine-hydrocodone) ..Marland Kitchen.. As needed    Avelox 400 Mg Tabs (Moxifloxacin hcl) ..Marland Kitchen.. 1 daily x 7 days    Benzonatate 100 Mg Caps (Benzonatate) ..Marland Kitchen.. 1 four times a day as needed  Orders: Est. Patient  Level III (46270)   Problem # 2:  ALLERGIC RHINITIS (ICD-477.9) Assessment: Unchanged continue vaccine Her updated medication list for this problem includes:    Flonase 50 Mcg/act Susp (Fluticasone propionate) .Marland Kitchen... 2 puffs in a.m.  Orders: Est. Patient Level III (35009)   Medications Added to Medication List This Visit: 1)  Methocarbamol 500 Mg Tabs (Methocarbamol) .... Take 1 every 8 hours as needed 2)  Avelox 400 Mg Tabs (Moxifloxacin hcl) .Marland Kitchen.. 1 daily x 7 days 3)  Diflucan 150 Mg Tabs (Fluconazole) .Marland Kitchen.. 1 daily 4)  Benzonatate 100 Mg Caps (Benzonatate) .Marland Kitchen.. 1 four times a day as needed 5)  Allergy Vaccine 1:10 Go (w-e)  6)  Epipen 2-pak 0.3 Mg/0.75m (1:1000) Devi (Epinephrine hcl  (anaphylaxis)) .... For severe allergic reaction   Patient Instructions: 1)  Please schedule a follow-up appointment in 2 months. 2)  neb xop 1.25 3)  meds went to your drug store    Prescriptions: BENZONATATE 100 MG  CAPS (BENZONATATE) 1 four times a day as needed  #30 x prn   Entered and Authorized by:   CDeneise LeverMD   Signed by:   CDeneise LeverMD on 09/07/2007   Method used:   [facility] to ...       [facility]  [address] [id number]       [address]      [place]  [address]      Ph: [phone] [phone]      Fax: [phone]  RxID:   [id number] 150 MG  TABS (FLUCONAZOLE) 1 daily  #3 x 1   Entered and Authorized by:   CDeneise LeverMD   Signed by:   CDeneise LeverMD on 09/07/2007   Method used:   [facility] sent to ...       [facility]  [address] [facility]       [address]      [place]  [address]      Ph: [phone] [phone]      Fax: [phone]  RxID:   [id number] 400 MG  TABS (MOXIFLOXACIN HCL) 1 daily x 7 days  #7 x 0   Entered and Authorized by:   CDeneise LeverMD   Signed by:   CDeneise LeverMD on 09/07/2007   Method used:   Elect[facility]ically sent to ...       [facility]  [address] [facility]       [address]      [place]  [address]      Ph: [phone] [phone]      Fax: [phone]  RxID:   [id number] ]

## 2010-08-17 NOTE — Assessment & Plan Note (Signed)
Summary: 3 weeks/apc   Visit Type:  Follow-up PCP:  W. Elkins/ [facility]  Chief Complaint:  follow up visit.  History of Present Illness: Current Problems:  REFLUX, ESOPHAGEAL (ICD-530.81) ASTHMATIC BRONCHITIS, ACUTE (ICD-466.0) RHEUMATOID ARTHRITIS (ICD-714.0) ALLERGIC RHINITIS (ICD-16.42) 65 year old woman returning for follow-up of allergic rhinitis and asthmatic bronchitis complicated by rheumatoid arthritis and reflux.  Very depressed.  Left foot stress fracture.  She was just beginning to get active again.  Orthopedist has put her on prednisone taper over the foot.  She feels unable to manage symbicort as an inhaler dwillespite counseling.  We discussed use of a nebulizer machine instead.         Prior Medications Reviewed Using: Patient Recall  Updated Prior Medication List: SINGULAIR 10 MG  TABS (MONTELUKAST SODIUM) one by mouth qd ALLEGRA-D 12 HOUR 60-120 MG  TB12 (FEXOFENADINE-PSEUDOEPHEDRINE) every morning VERAPAMIL HCL CR 240 MG  TBCR (VERAPAMIL HCL) one by mouth qd LIPITOR 10 MG  TABS (ATORVASTATIN CALCIUM) 1/2 qd * POTASSIUM 20 micrograms qid TRIAMTERENE-HCTZ 75-50 MG  TABS (TRIAMTERENE-HCTZ) tid CLIDINIUM-CHLORDIAZEPOXIDE 2.5-5 MG  CAPS (CLIDINIUM-CHLORDIAZEPOXIDE) tid NEXIUM 40 MG  CPDR (ESOMEPRAZOLE MAGNESIUM) take 1 by mouth once daily FLONASE 50 MCG/ACT  SUSP (FLUTICASONE PROPIONATE) 2 puffs in a.m. VITAMIN D 49702 UNIT  CAPS (ERGOCALCIFEROL) one by mouth q two weeks FOSAMAX PLUS D 70-5600 MG-UNIT  TABS (ALENDRONATE-CHOLECALCIFEROL)  SYMBICORT 80-4.5 MCG/ACT  AERO (BUDESONIDE-FORMOTEROL FUMARATE) two puffs bid ALBUTEROL 90 MCG/ACT  AERS (ALBUTEROL) prn DARVOCET-N 50 50-325 MG  TABS (PROPOXYPHENE N-APAP) 1-3 prn TUSSIONEX PENNKINETIC ER 8-10 MG/5ML  LQCR (CHLORPHENIRAMINE-HYDROCODONE) take 1 teaspoon by mouth two times a day as needed BENZONATATE 100 MG  CAPS (BENZONATATE) 1 four times a day as needed * ALLERGY VACCINE 1:10 GO (W-E)  EPIPEN 2-PAK 0.3 MG/0.3ML  (1:1000)  DEVI (EPINEPHRINE HCL (ANAPHYLAXIS)) For severe allergic reaction ZYRTEC ALLERGY 10 MG  TABS (CETIRIZINE HCL) take 1 by mouth at bedtime CELEBREX 200 MG  CAPS (CELECOXIB) two times a day  Current Allergies (reviewed today): ERYTHROMYCIN  Past Medical History:    Reviewed history from 03/03/2008 and no changes required:       REFLUX, ESOPHAGEAL (ICD-530.81)       ASTHMATIC BRONCHITIS, ACUTE (ICD-466.0)       RHEUMATOID ARTHRITIS (ICD-714.0)       ALLERGIC RHINITIS (ICD-477.9)           Social History:    Reviewed history from 03/10/2008 and no changes required:       Patient never smoked.        Positive history of passive tobacco smoke exposure.        Exercise- at least 2 times weekly       Caffeine-2 cups in morning       ETOH-glass of wine each night.       Married with 2 children.     Review of Systems      See HPI   Vital Signs:  Patient Profile:   66 Years Old Female O2 Sat:      95 % O2 treatment:    Room Air Pulse rate:   100 / minute BP sitting:   120 / 82  (left arm) Cuff size:   regular  Vitals Entered By: Clayborne Dana CMA (March 25, 2008 1:39 PM)             Comments Medications reviewed with patient [place]  March 25, 2008 1:41 PM      Physical Exam  General: A/Ox3; pleasant and cooperative, NAD, tearful sniffling, overweight SKIN: no rash, lesions NODES: no lymphadenopathy HEENT: [place]/AT, EOM- WNL, Conjuctivae- clear, PERRLA, TM-WNL, Nose- clear, Throat- somewhat reddened. NECK: Supple w/ fair ROM, JVD- none, normal carotid impulses w/o bruits Thyroid- normal to palpation CHEST: Clear to P&A HEART: RRR, no m/g/r heard ABDOMEN: Soft and nl; nml bowel sounds; no organomegaly or masses noted VXU:CJAR, nl pulses, no edema , left foot in awalking boot NEURO: Grossly intact to observation         Impression & Recommendations:  Problem # 1:  ASTHMATIC BRONCHITIS, ACUTE (ICD-466.0) exacerbation of asthmatic  bronchitis.  Currently on a prednisone taper.  We need to find ways to minimize systemic spheroids, as discussed.  Plan: Home nebulizer machine for use with albuterol plus ipratropium. Incidental note that she is quite discouraged.  She accepts a referral to the [facility] to work with her depression. Her updated medication list for this problem includes:    Singulair 10 Mg Tabs (Montelukast sodium) ..... One by mouth qd    Allegra-d 12 Hour 60-120 Mg Tb12 (Fexofenadine-pseudoephedrine) ..... Every morning    Symbicort 80-4.5 Mcg/act Aero (Budesonide-formoterol fumarate) .Marland Kitchen..Marland Kitchen Two puffs bid    Albuterol 90 Mcg/act Aers (Albuterol) .Marland Kitchen... Prn    Tussionex Pennkinetic Er 8-10 Mg/9m Lqcr (Chlorpheniramine-hydrocodone) ..Marland Kitchen.. Take 1 teaspoon by mouth two times a day as needed    Benzonatate 100 Mg Caps (Benzonatate) ..Marland Kitchen.. 1 four times a day as needed   Problem # 2:  ALLERGIC RHINITIS (ICD-477.9) Try Patanase nasal spray. Her updated medication list for this problem includes:    Flonase 50 Mcg/act Susp (Fluticasone propionate) ..Marland Kitchen.. 2 puffs in a.m.    Zyrtec Allergy 10 Mg Tabs (Cetirizine hcl) ..Marland Kitchen.. Take 1 by mouth at bedtime    Patanase 0.6 % Soln (Olopatadine hcl) ..Marland Kitchen.. 1-2 sprays each nostril up to twice daily as needed   Problem # 3:  ANXIETY DEPRESSION (ICD-300.4) Severely depressed.Dr. EArelia Sneddontook her off of Amitriptylline and then something else. Tearful and crying about multiple situational problems.  Medications Added to Medication List This Visit: 1)  Patanase 0.6 % Soln (Olopatadine hcl) ..Marland Kitchen. 1-2 sprays each nostril up to twice daily as needed   Patient Instructions: 1)  Please schedule a follow-up appointment in 2 months. 2)  [facility]  sample and script for Patanase   Prescriptions: PATANASE 0.6 % SOLN (OLOPATADINE HCL) 1-2 sprays each nostril up to twice daily as needed  #1 x prn   Entered and Authorized by:   CDeneise LeverMD   Signed  by:   CDeneise LeverMD on 03/25/2008   Method used:   Print then Give to Patient   RxID:   [id number] ]

## 2010-08-17 NOTE — Progress Notes (Signed)
Summary: med reaction to [facility]  Phone Note Call from Patient   Caller: Patient Call For: Maeli Spacek Summary of Call: pt having reaction to oravig had samples only Initial call taken by: Gustavus Bryant,  July 24, 2009 11:20 AM  Follow-up for Phone Call        Pt c/o nausea, headache, and feeling dizzy starting approx 2 hours after she placed the Oravig on her gum. Sx did not resolve until she removed medication. Pt states she placed same 0930 hrs and it was not dissolved by 2030hrs, so she removed it. Pt is requesting Diflucan 184m to take along with abx. Please advise. Thanks! JIran PlanasCMA  July 24, 2009 11:42 AM   Medication allergy: 1)  Erythromycin  Additional Follow-up for Phone Call Additional follow up Details #1::        OK to mark chart intolerant of Oravig as described.  Ok to script Diflucan 100 mg, # 7, ref x 1, 1 daily by mouth. Additional Follow-up by: CDeneise LeverMD,  July 24, 2009 12:32 PM   New Allergies: ! * ORAVIG New/Updated Medications: DIFLUCAN 100 MG TABS (FLUCONAZOLE) Take 1 tablet by mouth once a day New Allergies: ! * ORAVIGPrescriptions: DIFLUCAN 100 MG TABS (FLUCONAZOLE) Take 1 tablet by mouth once a day  #7 x 1   Entered by:   JRandolph BingCMA   Authorized by:   CDeneise LeverMD   Signed by:   JRandolph BingCMA on 07/24/2009   Method used:   Electronically to        [id number]       [address]       [place]  [address]      Ph: [phone] [phone]      Fax: [phone]  RxID:   [id number]

## 2010-08-17 NOTE — Progress Notes (Signed)
Summary: CONGESTED SORE THROAT--NA X 1  Phone Note Call from Patient   Caller: Patient Call For: Keiry Kowal Complaint: Earache/Ear Infection Summary of Call: CONGESTED COUGH AT [address]  Follow-up for Phone Call        Pt last seen by CY 11/06/07.  Called spoke with pt.  Pt has been congested x 2 weeks when goes to bed.  Sore throat started today, CY has no available appts, pt thinks she has a sinus infection again. Requesting rx be called in. Follow-up by: Freddrick March RN,  December 21, 2007 10:57 AM  Additional Follow-up for Phone Call Additional follow up Details #1::        Please send omnicef 300 mg, # 14, 2 daily   Additional Follow-up by: Deneise Lever MD,  December 21, 2007 11:39 AM    Additional Follow-up for Phone Call Additional follow up Details #2::    pt called again. ALSO requesting tussinex for cough. [facility] [address] pt # [phone] (cell). Cooper Render  December 21, 2007 12:05 PM Pt also wants CY to know that she has an upset stomach that started  today. Freddrick March RN  December 21, 2007 2:13 PM   Additional Follow-up for Phone Call Additional follow up Details #3:: Details for Additional Follow-up Action Taken: Offer Tussionex 200 ml, 1 tsp two times a day as needed   called pt and she is aware of tussionex sent into her [facility]  December 25, 2007 2:25 PM  Additional Follow-up by: Deneise Lever MD,  December 25, 2007 1:51 PM  New/Updated Medications: Cathie Hoops ER 8-10 MG/5ML  LQCR (CHLORPHENIRAMINE-HYDROCODONE) take 1 teaspoon by mouth two times a day as needed OMNICEF 300 MG  CAPS (CEFDINIR) 2 by mouth once daily   Prescriptions: TUSSIONEX PENNKINETIC ER 8-10 MG/5ML  LQCR (CHLORPHENIRAMINE-HYDROCODONE) take 1 teaspoon by mouth two times a day as needed  #216m x 0   Entered by:   LRamiro HarvestCMA   Authorized by:   CDeneise LeverMD   Signed by:   LRamiro HarvestCMA on 12/25/2007   Method used:    Telephoned to ...       [facility]  [address] [id number]       [address]      [place]  [address]      Ph: [phone] [phone]      Fax: [phone]  RxID:   [id number] 300 MG  CAPS (CEFDINIR) 2 by mouth once daily  #14 x 0   Entered by:   TMaryann ConnersCMA   Authorized by:   CDeneise LeverMD   Signed by:   TMaryann ConnersCMA on 12/21/2007   Method used:   Electronically sent to ...       [facility]  [address] [id number]       [address]      [place]  [address]      Ph: [phone] [phone]      Fax: [phone]  RxID:   [id number]

## 2010-08-17 NOTE — Assessment & Plan Note (Signed)
Summary: 4 months/apc   Visit Type:  Follow-up PCP:  W. Elkins/ [facility]  Chief Complaint:  Pt returns for followup.  She c/o prod cough at bedtime with yellow sputum, and has chest congestion.  She states that she has trouble using symbicort, and "does'nt seem like I am getting med".  .  History of Present Illness: Current Problems:  REFLUX, ESOPHAGEAL (ICD-530.81) ASTHMATIC BRONCHITIS, ACUTE (ICD-466.0) RHEUMATOID ARTHRITIS (ICD-714.0) ALLERGIC RHINITIS (ICD-8.39)  65 year old woman returning for follow-up of allergic rhinitis, asthmatic  bronchitis complicated by esophageal reflux, and rheumatoid arthritis.  Had a cortisone injection on Friday after fusion of left ankle in July.  Persistent feeling of sinus infectiion after 3 rounds of antibiotics.  Doing saline rinse of her sinuses and getting white or yellow discharge. Complains of productive cough and chest congestion.t Sputum culture in April grew Candida, probably an oral contaminant.  Had arterial blood gas today for Dr. Arelia Sneddon:  Room air pH 7.40, PCO2 42, PO2 71, bicarbonate 25.         Current Allergies (reviewed today): ERYTHROMYCIN  Past Medical History:    Reviewed history from 05/12/2007 and no changes required:       REFLUX, ESOPHAGEAL (ICD-530.81)       ASTHMATIC BRONCHITIS, ACUTE (ICD-466.0)       RHEUMATOID ARTHRITIS (ICD-714.0)       ALLERGIC RHINITIS (ICD-477.9)          Past Surgical History:    Fusion left ankle- 2009   Social History:    Reviewed history from 09/07/2007 and no changes required:       Patient never smoked.    Risk Factors:  Tobacco use:  never   Review of Systems      See HPI   Vital Signs:  Patient Profile:   65 Years Old Female Weight:      176 pounds O2 Sat:      97 % O2 treatment:    Room Air Pulse rate:   87 / minute BP sitting:   118 / 70  (left arm)  Vitals Entered By: Parke Poisson CNA (March 03, 2008 3:45 PM)             Is Patient Diabetic?  No Comments Medications reviewed with patient Parke Poisson CNA  March 03, 2008 3:45 PM      Physical Exam  General: A/Ox3; pleasant and cooperative, NAD, SKIN: no rash, lesions NODES: no lymphadenopathy NECK: Supple w/ fair ROM, JVD- none, normal carotid impulses w/o bruits Thyroid- normal to palpation CHEST: Clear to P&A HEART: RRR, no m/g/r heard ABDOMEN: Soft and nl; nml bowel sounds; no organomegaly or masses noted XVQ:MGQQ, nl pulses, no edema, left ankle wrapped  NEURO: Grossly intact to observation HEENT: [place]/AT, EOM- wnl, Conjunctivae- clear, PERRLA, TMs- wnl, Nose- nasal mucosa red and raw, Throat- clear and wnl        Impression & Recommendations:  Problem # 1:  ALLERGIC RHINITIS (ICD-477.9) Rrhinosinusitis, chronic. Plan: continue nasal saline lavage. Limited CT of sinuses Her updated medication list for this problem includes:    Flonase 50 Mcg/act Susp (Fluticasone propionate) .Marland Kitchen... 2 puffs in a.m.    Zyrtec Allergy 10 Mg Tabs (Cetirizine hcl) .Marland Kitchen... Take 1 by mouth at bedtime.   Problem # 2:  ASTHMATIC BRONCHITIS, ACUTE (ICD-466.0) chest exam was unremarkable.  She may have a mild bronchitis, now.  Arthritis, and a component of depression related to not feeling well are part of this problem., I believe.  The following  medications were removed from the medication list:    Omnicef 300 Mg Caps (Cefdinir) .Marland Kitchen... 2 by mouth once daily    Augmentin 500-125 Mg Tabs (Amoxicillin-pot clavulanate) .Marland Kitchen... 1, twice daily    Doxycycline Hyclate 100 Mg Tabs (Doxycycline hyclate) .Marland Kitchen... Take two today then one once daily  Her updated medication list for this problem includes:    Singulair 10 Mg Tabs (Montelukast sodium) ..... One by mouth qd    Allegra-d 12 Hour 60-120 Mg Tb12 (Fexofenadine-pseudoephedrine) ..... Every morning    Symbicort 80-4.5 Mcg/act Aero (Budesonide-formoterol fumarate) .Marland Kitchen..Marland Kitchen Two puffs bid    Albuterol 90 Mcg/act Aers (Albuterol) .Marland Kitchen... Prn    Tussionex  Pennkinetic Er 8-10 Mg/74m Lqcr (Chlorpheniramine-hydrocodone) ..Marland Kitchen.. Take 1 teaspoon by mouth two times a day as needed    Benzonatate 100 Mg Caps (Benzonatate) ..Marland Kitchen.. 1 four times a day as needed  Orders: Est. Patient Level III ((28833 Radiology Referral (Radiology)   Medications Added to Medication List This Visit: 1)  Celebrex 200 Mg Caps (Celecoxib) .... Two times a day   Patient Instructions: 1)  Please schedule a follow-up appointment in 3 weeks. 2)  A Limited Sinus CT has been recommended.  Your imaging study may require preauthorization.    ]  Appended Document: 4 months/apc-update fam hx        Current Allergies: ERYTHROMYCIN   Family History:    Mother- living age 67 heart disease, arthritis.    Father- deceased age 67 heart attack.    Sibling 1- living age 257 DM, heart attack  Social History:    Patient never smoked.     Positive history of passive tobacco smoke exposure.     Exercise- at least 2 times weekly    Caffeine-2 cups in morning    ETOH-glass of wine each night.    Married with 2 children.    Risk Factors:  Passive smoke exposure:  yes        ]

## 2010-08-17 NOTE — Assessment & Plan Note (Signed)
Summary: f/u ///kp   PCP:  W. Elkins/ Truslow  Chief Complaint:  1 month follow up , c/o sore throat , and ainua drainage green.  History of Present Illness: From 03/25/08- 65 year old woman returning for follow-up of allergic rhinitis and asthmatic bronchitis complicated by rheumatoid arthritis and reflux.  Very depressed.  Left foot stress fracture.  She was just beginning to get active again.  Orthopedist has put her on prednisone taper over the foot.  She feels unable to manage symbicort as an inhaler dwillespite counseling.  We discussed use of a nebulizer machine instead.  05/15/08- [facility]. Berenice [facility]as [facility] has been a big help with depression and frustration related to health probs. Had to postpone trip to [place]- left ankle pain- Dr Beola Cord.Laverle Hobby using nebulizer- about twice daily, improves sleep comfort. Still sense "hanging" in back of throat. Rattling cough is sometimes productive. Denies significant wheeze, chest pain or palpitation, reflux, fever. Discussed Neti pot.  07/04/08- Allergic rhinitis, asthmatic bronchitis, RA Has recurrent shingles despite vaccine. Now on Acyclovir, just finished and switched to prednisone taper, managed by Dr Arelia Sneddon. Breathing pretty well on prednisone. Now 1 week sore throat. Not definitie fever.Nonpurulent but blowing mucus. Paxil has been big help with mood but still can't sleep well at night. Also helps to be able to talk to Dr Berenice Primas in [place].  08/13/08- Allergic rhinitis, asthmaticbronchitis, RA Stressed- mother GI bleed/diverticulosis. Got run down and chilled caring then for mother. Was feeling well until 2 days ago woke from sleep with cough. Still some mild postnasal drainage, coughs some mucus. Going to [place] in March.  09/16/08- Allergic rhinitis, asthmatic bronchitis Going to [place] in a few days. Has not started a pred taper Dr Charlestine Night gave her for the trip.  3 days ago began getting hoarse, with green from nose and chest  with Neti pot and nebulizer.Doubts fever but throat feels irritated. Daughter has "bacterial throat infection". Had Norovirus GI, now resolved.       Prior Medications Reviewed Using: List Brought by Patient  Prior Medication List:  SINGULAIR 10 MG  TABS (MONTELUKAST SODIUM) one by mouth once daily ALLEGRA-D 12 HOUR 60-120 MG  TB12 (FEXOFENADINE-PSEUDOEPHEDRINE) Take 1 tablet by mouth two times a day VERAPAMIL HCL CR 240 MG  TBCR (VERAPAMIL HCL) one by mouth once daily KLOR-CON 20 MEQ PACK (POTASSIUM CHLORIDE) Take 1 tablet by mouth four times a day TRIAMTERENE-HCTZ 75-50 MG  TABS (TRIAMTERENE-HCTZ) Take 1 tablet by mouth once a day CLIDINIUM-CHLORDIAZEPOXIDE 2.5-5 MG  CAPS (CLIDINIUM-CHLORDIAZEPOXIDE) three times a day NEXIUM 40 MG  CPDR (ESOMEPRAZOLE MAGNESIUM) take 1 by mouth once daily FLONASE 50 MCG/ACT  SUSP (FLUTICASONE PROPIONATE) 2 puffs in a.m. VITAMIN D 02725 UNIT  CAPS (ERGOCALCIFEROL) one by mouth every two weeks FOSAMAX PLUS D 70-5600 MG-UNIT  TABS (ALENDRONATE-CHOLECALCIFEROL) once a week SYMBICORT 160-4.5 MCG/ACT  AERO (BUDESONIDE-FORMOTEROL FUMARATE) Two puffs twice daily PROAIR HFA 108 (90 BASE) MCG/ACT AERS (ALBUTEROL SULFATE) 2 puffs every 4-6 hours as needed DARVOCET-N 50 50-325 MG  TABS (PROPOXYPHENE N-APAP) 1-3 tab by mouth as needed * ALLERGY VACCINE 1:10 GO (W-E) once a week EPIPEN 2-PAK 0.3 MG/0.3ML (1:1000)  DEVI (EPINEPHRINE HCL (ANAPHYLAXIS)) For severe allergic reaction ZYRTEC ALLERGY 10 MG  TABS (CETIRIZINE HCL) take 1 by mouth at bedtime CELEBREX 200 MG  CAPS (CELECOXIB) Take 1 tablet by mouth once a day IPRATROPIUM-ALBUTEROL 0.5-2.5 (3) MG/3ML SOLN (IPRATROPIUM-ALBUTEROL) 1 neb four times a day as needed FENOFIBRATE MICRONIZED 134 MG CAPS (FENOFIBRATE MICRONIZED) Take 1  capsule by mouth once a day GLYCOPYRROLATE 1 MG TABS (GLYCOPYRROLATE) 1 tab by mouth as needed CENTRUM SILVER  TABS (MULTIPLE VITAMINS-MINERALS) Take 1 tablet by mouth once a day XANAX 1  MG TABS (ALPRAZOLAM) Take 1 tab by mouth at bedtime VOLTAREN 1 % GEL (DICLOFENAC SODIUM) apply to affected area four times a day PAXIL 20 MG TABS (PAROXETINE HCL) take 1 1/2 tabs by mouth daily DOXYCYCLINE HYCLATE 100 MG CAPS (DOXYCYCLINE HYCLATE) 2 today then one daily FLUCONAZOLE 150 MG TABS (FLUCONAZOLE) 1 daily TUSSIONEX PENNKINETIC ER 8-10 MG/5ML LQCR (CHLORPHENIRAMINE-HYDROCODONE) 1 tsp two times a day prn DOXYCYCLINE HYCLATE 100 MG TABS (DOXYCYCLINE HYCLATE) 2 today then one daily   Current Allergies (reviewed today): ERYTHROMYCIN Past Medical History:    Reviewed history from 03/03/2008 and no changes required:       REFLUX, ESOPHAGEAL (ICD-530.81)       ASTHMATIC BRONCHITIS, ACUTE (ICD-466.0)       RHEUMATOID ARTHRITIS (ICD-714.0)       ALLERGIC RHINITIS (ICD-477.9)          Past Surgical History:    Reviewed history from 03/03/2008 and no changes required:       Fusion left ankle- 2009  Social History:    Reviewed history from 03/10/2008 and no changes required:       Patient never smoked.        Positive history of passive tobacco smoke exposure.        Exercise- at least 2 times weekly       Caffeine-2 cups in morning       ETOH-glass of wine each night.       Married with 2 children.    Review of Systems      See HPI  Vital Signs:  Patient Profile:   65 Years Old Female Weight:      171 pounds O2 Sat:      95 % O2 treatment:    Room Air Pulse rate:   104 / minute BP sitting:   130 / 70  (left arm)  Vitals Entered By: Stefani Dama RCP, LPN (September 16, 9733 3:29 PM)             Is Patient Diabetic? No Comments Medications reviewed with patient Stefani Dama RCP, LPN  September 16, 9240 6:83 PM     Physical Exam  General: A/Ox3; pleasant and cooperative, NAD, SKIN: no rash, lesions NODES: no lymphadenopathy HEENT: /AT, EOM- WNL, Conjuctivae- clear, PERRLA, TM-WNL, Nose- clear, Throat- mild erythema posterior palate, maybe slight coating of  tongue, mild hoarse, mucus in nose NECK: Supple w/ fair ROM, JVD- none, normal carotid impulses w/o bruits Thyroid- normal to palpation CHEST: Clear to P&A, clear today HEART: RRR, no m/g/r heard MHD:QQIW, nl pulses, no edema  NEURO: Grossly intact to observation      Impression & Recommendations:  Problem # 1:  ALLERGIC RHINITIS (ICD-477.9) Rhinosinusitis  probably viral. She is anxious to make the trip to [place]. Discussed Diflucan, biaxin, prednisone. Her updated medication list for this problem includes:    Flonase 50 Mcg/act Susp (Fluticasone propionate) .Marland Kitchen... 2 puffs in a.m.    Zyrtec Allergy 10 Mg Tabs (Cetirizine hcl) .Marland Kitchen... Take 1 by mouth at bedtime   Problem # 2:  ASTHMATIC BRONCHITIS, ACUTE (ICD-466.0) Not severe yet and hopefully this won't progress. The following medications were removed from the medication list:    Doxycycline Hyclate 100 Mg Caps (Doxycycline hyclate) .Marland Kitchen... 2 today then one daily  Doxycycline Hyclate 100 Mg Tabs (Doxycycline hyclate) .Marland Kitchen... 2 today then one daily  Her updated medication list for this problem includes:    Singulair 10 Mg Tabs (Montelukast sodium) ..... One by mouth once daily    Allegra-d 12 Hour 60-120 Mg Tb12 (Fexofenadine-pseudoephedrine) .Marland Kitchen... Take 1 tablet by mouth two times a day    Symbicort 160-4.5 Mcg/act Aero (Budesonide-formoterol fumarate) .Marland Kitchen..Marland Kitchen Two puffs twice daily    Proair Hfa 108 (90 Base) Mcg/act Aers (Albuterol sulfate) .Marland Kitchen... 2 puffs every 4-6 hours as needed    Ipratropium-albuterol 0.5-2.5 (3) Mg/20m Soln (Ipratropium-albuterol) ..Marland Kitchen.. 1 neb four times a day as needed    Tussionex Pennkinetic Er 8-10 Mg/546mLqcr (Chlorpheniramine-hydrocodone) ...Marland Kitchen. 1 tsp two times a day prn    Clarithromycin 250 Mg Tabs (Clarithromycin) ...Marland Kitchen. 1 two times a day before meals   Medications Added to Medication List This Visit: 1)  Tramadol Hcl 50 Mg Tabs (Tramadol hcl) .... 2 tablets every 6 hrs as needed 2)  Clarithromycin 250 Mg  Tabs (Clarithromycin) ...Marland Kitchen 1 two times a day before meals 3)  Fluconazole 150 Mg Tabs (Fluconazole) ...Marland Kitchen 1 daily for yeast  Patient Instructions: 1)  Please schedule a follow-up appointment in 4 months. 2)  Scripts for meds sent to your drug store. Prescriptions: FLUCONAZOLE 150 MG TABS (FLUCONAZOLE) 1 daily for yeast  #1 x 0   Entered and Authorized by:   ClDeneise LeverD   Signed by:   ClDeneise LeverD on 09/16/2008   Method used:   Electronically to        [id number]       [address]      [place]  [address]     Ph: [phone]     Fax: [phone] RxID:   [id number] 250 MG TABS (CLARITHROMYCIN) 1 two times a day before meals  #20 x 0   Entered and Authorized by:   ClDeneise LeverD   Signed by:   ClDeneise LeverD on 09/16/2008   Method used:   [facility]ronically to        [id number]       [address]      [place]  [address]     Ph: [phone]     Fax: [phone] RxID:   [id number]

## 2010-08-17 NOTE — Assessment & Plan Note (Signed)
Summary: cough,plegm in throat/apc   Primary Provider/Referring Provider:  W. Elkins/ Truslow  CC:  Accute visit-nasal drainage and yellow to green in color-.  History of Present Illness:  09/16/08- Allergic rhinitis, asthmatic bronchitis Going to [place] in a few days. Has not started a pred taper Dr Charlestine Night gave her for the trip.  3 days ago began getting hoarse, with green from nose and chest with Neti pot and nebulizer.Doubts fever but throat feels irritated. Daughter has "bacterial throat infection". Had Norovirus GI, now resolved.  12/19/08- Allergic rhinitis, rhinosinusitis, asthmatic bronchitis, rheumatoid arthritis Needed antibiotic and diflucan while in [place], but did well for awhile on return. In last 2-3 weeks - cough with yellow, getting worse and waking her at night, nose and throat pressure and drainage, no fever. Neti pot brings out green. Exposed to a lot of Alonza Knisley grandchildren with coughs etc.  April 20, 2009-  Allergic rhinits, rhinosinusitis, asthmatic bronchitis, Rheumatoid Arthritis Tussionex and benzonatate have been a help for cough. Some wheeze. last definite purulent discharge was at least 2 weeks ago. Rain clears the air for her.  July 21, 2009- Allergic rhinitis, rhinosinusitis, asthmatic bronchitis, Rheumatoid arthritis After last here, she says cough never really went away. Feels patch of thrush. [place]. Did get flu shot and has had pneumovax twice. Phlegm won't come up easily.Wheezes. Denies chest pain. May have had some fever. Denies feeling reflux.       Current Medications (verified): 1)  Singulair 10 Mg  Tabs (Montelukast Sodium) .... One By Mouth Once Daily 2)  Claritin 10 Mg Tabs (Loratadine) .... Take 1 By Mouth Once Daily 3)  Verapamil Hcl Cr 240 Mg  Tbcr (Verapamil Hcl) .... One By Mouth Once Daily 4)  Klor-Con 20 Meq Pack (Potassium Chloride) .... Take 1 Tablet By Mouth Four Times A Day 5)  Triamterene-Hctz 75-50 Mg  Tabs  (Triamterene-Hctz) .... Take 1-2  Tablet By Mouth Once A Day 6)  Clidinium-Chlordiazepoxide 2.5-5 Mg  Caps (Clidinium-Chlordiazepoxide) .... Three Times A Day 7)  Nexium 40 Mg  Cpdr (Esomeprazole Magnesium) .... Take 1 By Mouth Once Daily 8)  Flonase 50 Mcg/act  Susp (Fluticasone Propionate) .... 2 Puffs in A.m. 9)  Vitamin D 50000 Unit  Caps (Ergocalciferol) .... One By Mouth Every Two Weeks 10)  Fosamax Plus D 70-5600 Mg-Unit  Tabs (Alendronate-Cholecalciferol) .... Once A Week 11)  Symbicort 160-4.5 Mcg/act  Aero (Budesonide-Formoterol Fumarate) .... Two Puffs Twice Daily 12)  Proair Hfa 108 (90 Base) Mcg/act Aers (Albuterol Sulfate) .... 2 Puffs Every 4-6 Hours As Needed 13)  Allergy Vaccine 1:10 Go (W-E) .... Once A Week 14)  Epipen 2-Pak 0.3 Mg/0.36m (1:1000)  Devi (Epinephrine Hcl (Anaphylaxis)) .... For Severe Allergic Reaction 15)  Celebrex 200 Mg  Caps (Celecoxib) .... Take 1 Tablet By Mouth Once A Day 16)  Ipratropium-Albuterol 0.5-2.5 (3) Mg/381mSoln (Ipratropium-Albuterol) ...Marland Kitchen 1 Neb Four Times A Day As Needed 17)  Fenofibrate Micronized 134 Mg Caps (Fenofibrate Micronized) .... Take 1 Capsule By Mouth Once A Day 18)  Glycopyrrolate 1 Mg Tabs (Glycopyrrolate) ...Marland Kitchen 1 Tab By Mouth As Needed 19)  Centrum Silver  Tabs (Multiple Vitamins-Minerals) .... Take 1 Tablet By Mouth Once A Day 20)  Xanax 1 Mg Tabs (Alprazolam) .... Take 1 Tab By Mouth At Bedtime 21)  Voltaren 1 % Gel (Diclofenac Sodium) .... Apply To Affected Area Four Times A Day 22)  Prozac 20 Mg Caps (Fluoxetine Hcl) .... Take 1 By Mouth Once Daily  23)  Tramadol Hcl 50 Mg Tabs (Tramadol Hcl) .... 2 Tablets Every 6 Hrs As Needed 24)  Bd Tb Syringe 27g X 1/2" 0.5 Ml Misc (Tuberculin-Allergy Syringes) .... Use To Give Allergy Vaccine or Other Medications As Directed 25)  Benzonatate 100 Mg Caps (Benzonatate) .... Take 1 Capsule Four Times A Day As Needed 26)  Chlorzoxazone 500 Mg Tabs (Chlorzoxazone) .... Take 1 By Mouth Every 6  Hours As Needed  Allergies (verified): 1)  ! * Oravig 2)  Erythromycin  Past History:  Past Medical History: Last updated: 03/03/2008 REFLUX, ESOPHAGEAL (ICD-530.81) ASTHMATIC BRONCHITIS, ACUTE (ICD-466.0) RHEUMATOID ARTHRITIS (ICD-714.0) ALLERGIC RHINITIS (ICD-477.9)    Past Surgical History: Last updated: 03/03/2008 Fusion left ankle- 2009  Family History: Last updated: 03/10/2008 Mother- living age 67; heart disease, arthritis. Father- deceased age 4; heart attack. Sibling 1- living age 84; DM, heart attack  Social History: Last updated: 03/10/2008 Patient never smoked.  Positive history of passive tobacco smoke exposure.  Exercise- at least 2 times weekly Caffeine-2 cups in morning ETOH-glass of wine each night. Married with 2 children.   Risk Factors: Smoking Status: never (03/03/2008) Passive Smoke Exposure: yes (03/10/2008)  Review of Systems      See HPI       The patient complains of weight loss, hoarseness, and prolonged cough.  The patient denies anorexia, fever, weight gain, vision loss, decreased hearing, syncope, dyspnea on exertion, peripheral edema, headaches, hemoptysis, abdominal pain, and severe indigestion/heartburn.    Vital Signs:  Patient profile:   65 year old female Weight:      149.50 pounds O2 Sat:      95 % on Room air Pulse rate:   99 / minute BP sitting:   126 / 80  (left arm) Cuff size:   regular  Vitals Entered By: Clayborne Dana CMA (July 21, 2009 2:37 PM)  O2 Flow:  Room air  Physical Exam  Additional Exam:  General: A/Ox3; pleasant and cooperative, NAD, overweight, talkative, she looks as if she feels better. SKIN: no rash, lesions NODES: no lymphadenopathy HEENT: [place]/AT, EOM- WNL, Conjuctivae- clear, PERRLA, TM-WNL, Nose- clear, Throat-  tongue, mild hoarse, red, mucus in nose NECK: Supple w/ fair ROM, JVD- none, normal carotid impulses w/o bruits Thyroid- CHEST: Clear to P&A, light dry cough with laiughter HEART:  RRR, no m/g/r heard IRC:VELF, nl pulses, no edema  NEURO: Grossly intact to observation     Impression & Recommendations:  Problem # 1:  TRACHEOBRONCHITIS (ICD-490)  I can't exclude GERD as basis for hoarseness and airway irritation, but she denies it and wants to focus on Thrush. I will try Oravig for thrush and we will also give her amoxacillin. The following medications were removed from the medication list:    Tussionex Pennkinetic Er 8-10 Mg/26m Lqcr (Chlorpheniramine-hydrocodone) ..Marland Kitchen.. 1 tsp two times a day prn    Clarithromycin 250 Mg Tabs (Clarithromycin) ..Marland Kitchen.. 1 two times a day before meals    Cefdinir 300 Mg Caps (Cefdinir) ..Marland Kitchen.. 2 daily Her updated medication list for this problem includes:    Singulair 10 Mg Tabs (Montelukast sodium) ..... One by mouth once daily    Symbicort 160-4.5 Mcg/act Aero (Budesonide-formoterol fumarate) ..Marland Kitchen..Marland KitchenTwo puffs twice daily    Proair Hfa 108 (90 Base) Mcg/act Aers (Albuterol sulfate) ..Marland Kitchen.. 2 puffs every 4-6 hours as needed    Ipratropium-albuterol 0.5-2.5 (3) Mg/383mSoln (Ipratropium-albuterol) ...Marland Kitchen. 1 neb four times a day as needed    Benzonatate 100 Mg Caps (Benzonatate) ...Marland KitchenMarland KitchenMarland KitchenMarland Kitchen  Take 1 capsule four times a day as needed    Amoxicillin 500 Mg Caps (Amoxicillin) .Marland Kitchen... 1 three times a day  Medications Added to Medication List This Visit: 1)  Oravig 50 Mg Tabs (Miconazole) .Marland Kitchen.. 1 daily 2)  Allegra 180 Mg Tabs (Fexofenadine hcl) .Marland Kitchen.. 1 daily as needed antihistamine 3)  Amoxicillin 500 Mg Caps (Amoxicillin) .Marland Kitchen.. 1 three times a day  Other Orders: Est. Patient Level III (35670)  Patient Instructions: 1)  Keep appointment 2)  Try Jerral Ralph for Thrush 3)  If Oravig doesn't help, then try a course of amoxacillin 4)  Ok to try Allegra (without the "D" decongestant) if needed as an antihistamine Prescriptions: AMOXICILLIN 500 MG CAPS (AMOXICILLIN) 1 three times a day  #21 x 0   Entered and Authorized by:   Deneise Lever MD   Signed by:   Deneise Lever MD on 07/21/2009   Method used:   Electronically to        [id number] (retail)       [address]       [place], [place]  [address]       Ph: [phone] or [phone]       Fax: [phone]   RxID:   [id number] ALLEGRA 180 MG TABS (FEXOFENADINE HCL) 1 daily as needed antihistamine  #30 x prn   Entered and Authorized by:   Deneise Lever MD   Signed by:   Deneise Lever MD on 07/21/2009   Method used:   Electronically to        [id number] (retail)       [address]       [place], [place]  [address]       Ph: [phone] or [phone]       Fax: [phone]   RxID:   [id number] ORAVIG 50 MG TABS (MICONAZOLE) 1 daily  #15 x 1   Entered and Authorized by:   Deneise Lever MD   Signed by:   Deneise Lever MD on 07/21/2009   Method used:   Electronically to        [id number] (retail)       [address]       [place],   [address]       Ph: [phone] or [phone]       Fax: [phone]   RxID:   [id number]

## 2010-08-17 NOTE — Assessment & Plan Note (Signed)
Summary: sore throat, cough x1 month/apc   Visit Type:  Follow-up PCP:  W. Elkins/ Truslow   History of Present Illness: Current Problems:  REFLUX, ESOPHAGEAL (ICD-530.81) ASTHMATIC BRONCHITIS, ACUTE (ICD-466.0) RHEUMATOID ARTHRITIS (ICD-714.0) ALLERGIC RHINITIS (ICD-48.69)  65 year old woman with chronic asthma complicated by rheumatoid arthritis, and allergic rhinitis.  On June 5.  We had called in a prescription for Omnicef x 7 days.  She still complains of throat drainage.  Cough syrup helps.  Neck keep not producing yellow nasal drainage.  Bottoms of her feet itch.  She denies fever, blood, chest pain or GI upset.       Prior Medications Reviewed Using: Patient Recall  Updated Prior Medication List: AMITRIPTYLINE HCL 150 MG  TABS (AMITRIPTYLINE HCL) take 1 by mouth at bedtime SINGULAIR 10 MG  TABS (MONTELUKAST SODIUM) one by mouth qd ALLEGRA-D 12 HOUR 60-120 MG  TB12 (FEXOFENADINE-PSEUDOEPHEDRINE) every morning VERAPAMIL HCL CR 240 MG  TBCR (VERAPAMIL HCL) one by mouth qd LIPITOR 10 MG  TABS (ATORVASTATIN CALCIUM) 1/2 qd * POTASSIUM 20 micrograms qid TRIAMTERENE-HCTZ 75-50 MG  TABS (TRIAMTERENE-HCTZ) tid CLIDINIUM-CHLORDIAZEPOXIDE 2.5-5 MG  CAPS (CLIDINIUM-CHLORDIAZEPOXIDE) tid LOPID 600 MG  TABS (GEMFIBROZIL) bid NEXIUM 40 MG  CPDR (ESOMEPRAZOLE MAGNESIUM) take 1 by mouth once daily FLONASE 50 MCG/ACT  SUSP (FLUTICASONE PROPIONATE) 2 puffs in a.m. VITAMIN D 71696 UNIT  CAPS (ERGOCALCIFEROL) one by mouth q two weeks FOSAMAX PLUS D 70-5600 MG-UNIT  TABS (ALENDRONATE-CHOLECALCIFEROL)  SYMBICORT 80-4.5 MCG/ACT  AERO (BUDESONIDE-FORMOTEROL FUMARATE) two puffs bid ALBUTEROL 90 MCG/ACT  AERS (ALBUTEROL) prn DARVOCET-N 50 50-325 MG  TABS (PROPOXYPHENE N-APAP) 1-3 prn TUSSIONEX PENNKINETIC ER 8-10 MG/5ML  LQCR (CHLORPHENIRAMINE-HYDROCODONE) take 1 teaspoon by mouth two times a day as needed BENZONATATE 100 MG  CAPS (BENZONATATE) 1 four times a day as needed * ALLERGY VACCINE  1:10 GO (W-E)  EPIPEN 2-PAK 0.3 MG/0.3ML (1:1000)  DEVI (EPINEPHRINE HCL (ANAPHYLAXIS)) For severe allergic reaction ZYRTEC ALLERGY 10 MG  TABS (CETIRIZINE HCL) take 1 by mouth at bedtime MOBIC 15 MG  TABS (MELOXICAM) take 1 by mouth once daily OMNICEF 300 MG  CAPS (CEFDINIR) 2 by mouth once daily  Current Allergies (reviewed today): ERYTHROMYCIN  Past Medical History:    Reviewed history from 05/12/2007 and no changes required:       Allergic rhinitis       Rheumatoid arthritis   Social History:    Reviewed history from 09/07/2007 and no changes required:       Patient never smoked.     Review of Systems      See HPI   Vital Signs:  Patient Profile:   65 Years Old Female Weight:      176.25 pounds O2 Sat:      97 % O2 treatment:    Room Air Pulse rate:   93 / minute BP sitting:   116 / 70  (left arm) Cuff size:   regular  Vitals Entered By: Clayborne Dana CMA (January 17, 2008 9:56 AM)                 Physical Exam  GENERAL:  A/Ox3; pleasant & cooperative.NAD, gaining weight  back. HEENT:  [place]/AT, EOM-wnl, PERRLA, EACs-clear, TMs-wnl, NOSE-clear, THROAT-clear & wnl. NECK:  Supple w/ fair ROM; no JVD; normal carotid impulses w/o bruits; no thyromegaly or nodules palpated; no lymphadenopathy. CHEST:Dry cough HEART:  RRR, no m/r/g  heard ABDOMEN:  Soft & nt; nml bowel sounds; no organomegaly or masses detected. EXT: Warm bilat,  no calf pain, edema, clubbing, pulses intact Skin:Skin of feet is peeling        Impression & Recommendations:  Problem # 1:  ALLERGIC RHINITIS (ICD-477.9) acute rhinosinusitis, slow to clear.  Plan: Augmentin for 10 days.  Diflucan x 4 days for yeast.  Hold Lipitor while on Diflucan. Her updated medication list for this problem includes:    Flonase 50 Mcg/act Susp (Fluticasone propionate) .Marland Kitchen... 2 puffs in a.m.    Zyrtec Allergy 10 Mg Tabs (Cetirizine hcl) .Marland Kitchen... Take 1 by mouth at bedtime   Problem # 2:  ASTHMATIC BRONCHITIS, ACUTE  (ICD-466.0) Assessment: Unchanged  The following medications were removed from the medication list:    Avelox 400 Mg Tabs (Moxifloxacin hcl) .Marland Kitchen... 1 daily x 7 days  Her updated medication list for this problem includes:    Singulair 10 Mg Tabs (Montelukast sodium) ..... One by mouth qd    Allegra-d 12 Hour 60-120 Mg Tb12 (Fexofenadine-pseudoephedrine) ..... Every morning    Symbicort 80-4.5 Mcg/act Aero (Budesonide-formoterol fumarate) .Marland Kitchen..Marland Kitchen Two puffs bid    Albuterol 90 Mcg/act Aers (Albuterol) .Marland Kitchen... Prn    Tussionex Pennkinetic Er 8-10 Mg/7m Lqcr (Chlorpheniramine-hydrocodone) ..Marland Kitchen.. Take 1 teaspoon by mouth two times a day as needed    Benzonatate 100 Mg Caps (Benzonatate) ..Marland Kitchen.. 1 four times a day as needed    Omnicef 300 Mg Caps (Cefdinir) ..Marland Kitchen.. 2 by mouth once daily    Augmentin 500-125 Mg Tabs (Amoxicillin-pot clavulanate) ..Marland Kitchen.. 1, twice daily   Medications Added to Medication List This Visit: 1)  Amitriptyline Hcl 150 Mg Tabs (Amitriptyline hcl) .... Take 1 by mouth at bedtime 2)  Augmentin 500-125 Mg Tabs (Amoxicillin-pot clavulanate) .... 1, twice daily 3)  Fluconazole 150 Mg Tabs (Fluconazole) ..Marland Kitchen. 1 daily   Patient Instructions: 1)  Make appt to see me before trip 2)  Augmentin and Diflucan scripts sent to your drug store   Prescriptions: FLUCONAZOLE 150 MG  TABS (FLUCONAZOLE) 1 daily  #4 x 0   Entered and Authorized by:   CDeneise LeverMD   Signed by:   CDeneise LeverMD on 01/17/2008   Method used:   Electron[facility] sent to ...       [facility]  [address] [id number]       [address]      [place]  [address]      Ph: [phone]      Fax: [phone]  RxID:   [id number] 500-125 MG  TABS (AMOXICILLIN-POT CLAVULANATE) 1, twice daily  #20 x 0   Entered and Authorized by:   CDeneise LeverMD   Signed by:   CDeneise LeverMD on 01/17/2008   Method used:   [facility] to ...       [facility]   [address] [id number]       [address]      [place]  [address]      Ph: [phone] [phone]      Fax: [phone]  RxID:   [id number] ]

## 2010-08-17 NOTE — Progress Notes (Signed)
Summary: sinis drainage  Medications Added OMNICEF 300 MG  CAPS (CEFDINIR) 2 by mouth once daily       Phone Note Call from Patient   Caller: Patient Call For: Traveon Louro Summary of Call: yellow drainage from sinis/ throat. req nurse [facility] [address] Patient's chart has been requested. Initial call taken by: Cooper Render,  August 29, 2007 9:45 AM  Follow-up for Phone Call        Pt has appt 09-07-07 with Dr. Annamaria Boots. Pt states that she is having sinus drainage and low grade fever. Also, pt is going out of town in am; would like something called in for her to help her get through this til she comes in to see CDY. Follow-up by: Clayborne Dana CMA,  August 29, 2007 10:30 AM  Additional Follow-up for Phone Call Additional follow up Details #1::        OK to send in [place] 300 mg, # 14, 2 daily  Additional Follow-up by: Deneise Lever MD,  August 29, 2007 12:38 PM    Additional Follow-up for Phone Call Additional follow up Details #2::    pt aware rx sent Follow-up by: Clayborne Dana CMA,  August 29, 2007 1:00 PM  New/Updated Medications: OMNICEF 300 MG  CAPS (CEFDINIR) 2 by mouth once daily   Prescriptions: OMNICEF 300 MG  CAPS (CEFDINIR) 2 by mouth once daily  #14 x 0   Entered by:   Clayborne Dana CMA   Authorized by:   Deneise Lever MD   Signed by:   Clayborne Dana CMA on 08/29/2007   Method used:   Electron[facility] ...       [facility]  [address] [id number]       [address]       [place], [place]  [address]       Ph: [phone] or [phone]       Fax: [phone]   RxID:   [id number]

## 2010-08-17 NOTE — Miscellaneous (Signed)
Summary: Orders Update home nebulizer  Medications Added IPRATROPIUM-ALBUTEROL 0.5-2.5 (3) MG/3ML SOLN (IPRATROPIUM-ALBUTEROL) 1 neb four times a day as needed       Clinical Lists Changes  Medications: Added new medication of IPRATROPIUM-ALBUTEROL 0.5-2.5 (3) MG/3ML SOLN (IPRATROPIUM-ALBUTEROL) 1 neb four times a day as needed - Signed Rx of IPRATROPIUM-ALBUTEROL 0.5-2.5 (3) MG/3ML SOLN (IPRATROPIUM-ALBUTEROL) 1 neb four times a day as needed;  #50 x prn;  Signed;  Entered by: Deneise Lever MD;  Authorized by: Deneise Lever MD;  Method used: Historical Orders: Added new Referral order of DME Referral (DME) - Signed    Prescriptions: IPRATROPIUM-ALBUTEROL 0.5-2.5 (3) MG/3ML SOLN (IPRATROPIUM-ALBUTEROL) 1 neb four times a day as needed  #50 x prn   Entered and Authorized by:   Deneise Lever MD   Signed by:   Deneise Lever MD on 03/31/2008   Method used:   Historical   RxID:   [id number]

## 2010-08-17 NOTE — Progress Notes (Signed)
Summary: neb meds  Phone Note Call from Patient   Caller: Patient Call For: young Summary of Call: pt just spoke to someone at [facility] and was told that her neb meds must be ordered through [facility] [phone]. caller at [phone] Initial call taken by: Cooper Render,  April 03, 2008 3:52 PM  Follow-up for Phone Call        Sent rx electronically to [facility].  Called spoke with pt advised rx sent to pharmacy. Follow-up by: Freddrick March RN,  April 03, 2008 4:00 PM      Prescriptions: IPRATROPIUM-ALBUTEROL 0.5-2.5 (3) MG/3ML SOLN (IPRATROPIUM-ALBUTEROL) 1 neb four times a day as needed  #50 x prn   Entered by:   Freddrick March RN   Authorized by:   Deneise Lever MD   Signed by:   Freddrick March RN on 04/03/2008   Method used:   Electronically to        [id number]* (retail)       [address]       [place], [place]  [address]       Ph: [phone] or [phone]       Fax: [phone]   RxID:   [id number]

## 2010-08-17 NOTE — Assessment & Plan Note (Signed)
Summary: 2 months/apc   Primary Provider/Referring Provider:  W. Elkins/ Truslow  CC:  2 month follow up visit-allergies.congestion in throat-yellow..  History of Present Illness: Dec 02, 2009- Allergic rhinitis, rhinosinusitis, asthmatic bronchitis, rheumatoid arthritis It took her a month after last here to really feel better. She feels maintenance erythromycin has helped a lot. She had a long interval of productive cough last month as if she was getting cleared out. She is near end of fixing old house with associated dust, and will move in July. Plans to keep her 27 yo grandaughter a lot this summer. We discussed potential for catching more colds. She continues two times a day Mycelex troches to suppress what she feels is recurrent yeast.  Dr Charlestine Night put her on prednisone for 3 weeks, now off about 3 weeks. It helped arthritic swelling in hands and feet.  December 24, 2009- Allergic rhinitis, rhinosinusitis, asthmatic bronchitis, rheumatoid artritis Can't shake the cough and chest congestion. Tried maintenance erythromycin but it just caused loose stools. Tussionex works. Denies any sense of reflux. hears some wheeze. Cough is productive yellow/ clear. needs CXR update.  February 17, 2010- Allergic rhinitis, rhinosinusiitis, asthmatic bronchitis, rheumatoid arthritis Had been doing much better for "quite awhile". But in last few days has felt there is something hanging in throat again, spitting out some mucus. Using Neti pot. Today it was turning yellow. Some headache. Back aches from a pulled muscle. She continues her regular meds and allergy vaccine. Allegra-D helps but she can't take it at night. asks refill tussionex- using small amt sometimes at night. They have refurbished old house, packed, moved out and now unpacking at new house where there is new flooring and carpet.     Preventive Screening-Counseling & Management  Alcohol-Tobacco     Smoking Status: never     Passive Smoke  Exposure: yes  Current Medications (verified): 1)  Singulair 10 Mg  Tabs (Montelukast Sodium) .... One By Mouth Once Daily 2)  Verapamil Hcl Cr 240 Mg  Tbcr (Verapamil Hcl) .... One By Mouth Once Daily 3)  Klor-Con 20 Meq Pack (Potassium Chloride) .... Take 1 Tablet By Mouth Four Times A Day 4)  Triamterene-Hctz 75-50 Mg  Tabs (Triamterene-Hctz) .... Take 1-2  Tablet By Mouth Once A Day 5)  Clidinium-Chlordiazepoxide 2.5-5 Mg  Caps (Clidinium-Chlordiazepoxide) .... Three Times A Day 6)  Nexium 40 Mg  Cpdr (Esomeprazole Magnesium) .... Take 1 By Mouth Once Daily 7)  Flonase 50 Mcg/act  Susp (Fluticasone Propionate) .... 2 Puffs in A.m. 8)  Vitamin D 50000 Unit  Caps (Ergocalciferol) .... One By Mouth Every Two Weeks 9)  Fosamax Plus D 70-5600 Mg-Unit  Tabs (Alendronate-Cholecalciferol) .... Once A Week 10)  Proair Hfa 108 (90 Base) Mcg/act Aers (Albuterol Sulfate) .... 2 Puffs Every 4-6 Hours As Needed 11)  Allergy Vaccine 1:10 Go (W-E) .... Once A Week 12)  Epipen 2-Pak 0.3 Mg/0.68m Devi (Epinephrine) .... For Severe Allergic Reaction 13)  Celebrex 200 Mg  Caps (Celecoxib) .... Take 1 Tablet By Mouth Once A Day 14)  Ipratropium-Albuterol 0.5-2.5 (3) Mg/329mSoln (Ipratropium-Albuterol) ...Marland Kitchen 1 Neb Four Times A Day As Needed 15)  Fenofibrate Micronized 134 Mg Caps (Fenofibrate Micronized) .... Take 1 Capsule By Mouth Once A Day 16)  Glycopyrrolate 1 Mg Tabs (Glycopyrrolate) ...Marland Kitchen 1 Tab By Mouth As Needed 17)  Centrum Silver  Tabs (Multiple Vitamins-Minerals) .... Take 1 Tablet By Mouth Once A Day 18)  Xanax 1 Mg Tabs (Alprazolam) .... Take  1 Tab By Mouth At Bedtime 19)  Voltaren 1 % Gel (Diclofenac Sodium) .... Apply To Affected Area Four Times A Day 20)  Prozac 20 Mg Caps (Fluoxetine Hcl) .... Take 1 By Mouth Once Daily 21)  Tramadol Hcl 50 Mg Tabs (Tramadol Hcl) .... 2 Tablets Every 6 Hrs As Needed 22)  Bd Tb Syringe 27g X 1/2" 0.5 Ml Misc (Tuberculin-Allergy Syringes) .... Use To Give Allergy  Vaccine or Other Medications As Directed 23)  Benzonatate 100 Mg Caps (Benzonatate) .... Take 1 Capsule Four Times A Day As Needed 24)  Chlorzoxazone 500 Mg Tabs (Chlorzoxazone) .... Take 1 By Mouth Every 6 Hours As Needed 25)  Fluconazole 150 Mg Tabs (Fluconazole) .Marland Kitchen.. 1 Daily 26)  Mycelex 10 Mg Troc (Clotrimazole) .... Melt in Mouth Twice Daily 27)  Nasalcrom 5.2 Mg/act Aers (Cromolyn Sodium) .Marland Kitchen.. 1-2 Sprays Each Nostril Four Times A Day / As Directed 28)  Augmentin 875-125 Mg Tabs (Amoxicillin-Pot Clavulanate) .... Take 1 Tablet By Mouth Two Times A Day  Allergies (verified): 1)  ! * Oravig 2)  ! Lowella Dell  Past History:  Past Medical History: Last updated: 03/03/2008 REFLUX, ESOPHAGEAL (ICD-530.81) ASTHMATIC BRONCHITIS, ACUTE (ICD-466.0) RHEUMATOID ARTHRITIS (ICD-714.0) ALLERGIC RHINITIS (ICD-477.9)    Past Surgical History: Last updated: 03/03/2008 Fusion left ankle- 2009  Family History: Last updated: 03/10/2008 Mother- living age 72; heart disease, arthritis. Father- deceased age 55; heart attack. Sibling 1- living age 21; DM, heart attack  Social History: Last updated: 03/10/2008 Patient never smoked.  Positive history of passive tobacco smoke exposure.  Exercise- at least 2 times weekly Caffeine-2 cups in morning ETOH-glass of wine each night. Married with 2 children.   Risk Factors: Smoking Status: never (02/17/2010) Passive Smoke Exposure: yes (02/17/2010)  Review of Systems      See HPI       The patient complains of shortness of breath with activity and non-productive cough.  The patient denies shortness of breath at rest, coughing up blood, chest pain, irregular heartbeats, acid heartburn, indigestion, loss of appetite, weight change, abdominal pain, difficulty swallowing, sore throat, tooth/dental problems, headaches, nasal congestion/difficulty breathing through nose, and sneezing.    Vital Signs:  Patient profile:   65 year old female Height:       62 inches Weight:      155.50 pounds BMI:     28.54 O2 Sat:      94 % on Room air Pulse rate:   87 / minute BP sitting:   110 / 70  (left arm) Cuff size:   regular  Vitals Entered By: Clayborne Dana CMA (February 17, 2010 11:24 AM)  O2 Flow:  Room air CC: 2 month follow up visit-allergies.congestion in throat-yellow.   Physical Exam  Additional Exam:  General: A/Ox3; pleasant and cooperative, NAD, overweight, talkative,  SKIN: no rash, lesions NODES: no lymphadenopathy HEENT: [place]/AT, EOM- WNL, Conjuctivae- clear, PERRLA, TM-WNL, Nose- clear, Throat-  no thrush,  patchy red on soft palate, not hoarse, no stridor NECK: Supple w/ fair ROM, JVD- none, normal carotid impulses w/o bruits Thyroid- CHEST: deep rhonchi without defintie wheeze, not coughing HEART: RRR, no m/g/r heard QMG:NOIB, nl pulses, no edema  NEURO: Grossly intact to observation     Impression & Recommendations:  Problem # 1:  RHINOSINUSITIS, 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 She is now in new home with more wood floors and new carpets, but the moving process wore her out and stirred up  a lot of dust.  I suggested she try Nyquil at bedtime, but will refill the Tusionex. I am hoping to get by without steroids or antibiotics this time.  Problem # 2:  TRACHEOBRONCHITIS (ICD-490) There tends to be some irritation into the upper trachea. The same treatment approach as above should be helpful. The following medications were removed from the medication list:    Allegra-d 24 Hour 180-240 Mg Xr24h-tab (Fexofenadine-pseudoephedrine) .Marland Kitchen... Take 1 by mouth  every morning    Tussionex Pennkinetic Er 8-10 Mg/44m Lqcr (Chlorpheniramine-hydrocodone) ..Marland Kitchen.. 1 teaspoon two times a day as needed cough Her updated medication list for this problem includes:    Singulair 10 Mg Tabs (Montelukast sodium) ..... One by mouth once daily    Proair Hfa 108 (90 Base) Mcg/act Aers (Albuterol  sulfate) ..Marland Kitchen.. 2 puffs every 4-6 hours as needed    Ipratropium-albuterol 0.5-2.5 (3) Mg/376mSoln (Ipratropium-albuterol) ...Marland Kitchen. 1 neb four times a day as needed    Benzonatate 100 Mg Caps (Benzonatate) ...Marland Kitchen. Take 1 capsule four times a day as needed    Augmentin 875-125 Mg Tabs (Amoxicillin-pot clavulanate) ...Marland Kitchen. Take 1 tablet by mouth two times a day    Tussionex Pennkinetic Er 8-10 Mg/15m67mqcr (Chlorpheniramine-hydrocodone) ....Marland Kitchen 1 teaspoon two times a day as needed cough  Medications Added to Medication List This Visit: 1)  Tussionex Pennkinetic Er 8-10 Mg/15ml69mcr (Chlorpheniramine-hydrocodone) .... Marland Kitchen teaspoon two times a day as needed cough  Other Orders: Est. Patient Level III ([id number] Instructions: 1)  Please schedule a follow-up appointment in 4 months. 2)  Try using Nyquil at bedtime if needed 3)  Tussionex refilled Prescriptions: TUSSIONEX PENNKINETIC ER 8-10 MG/5ML LQCR (CHLORPHENIRAMINE-HYDROCODONE) 1 teaspoon two times a day as needed cough  #200 ml x 0   Entered and Authorized by:   ClinDeneise Lever  Signed by:   ClinDeneise Leveron 02/17/2010   Method used:   Print then Give to Patient   RxID:   [id number]

## 2010-08-17 NOTE — Progress Notes (Signed)
Summary: sick  Phone Note Call from Patient Call back at Home Phone [phone]   Caller: Patient Call For: young Summary of Call: pt here 3 weeks ago, was given antibiotics for 10 days, has started back up.  Throat, night cough, gunk in back of throat. [facility] [name] [place] Initial call taken by: Zigmund Gottron,  February 11, 2008 10:11 AM  Follow-up for Phone Call        pt c/o cough-prod-white/yellow-cough worse at night, nasal and sinus congestion--pt requesting tussionex and anything else that cy thinks will be helpful -pls advise   Follow-up by: [place],  February 11, 2008 12:43 PM  Additional Follow-up for Phone Call Additional follow up Details #1::        Called in Tussionex 300 ml and Doxycycline 100 mg #8 to [facility] per Dr. Annamaria Boots. Jim Like  February 12, 2008 9:15 AM     New/Updated Medications: DOXYCYCLINE HYCLATE 100 MG  TABS (DOXYCYCLINE HYCLATE) Take Two today then one once daily   Prescriptions: DOXYCYCLINE HYCLATE 100 MG  TABS (DOXYCYCLINE HYCLATE) Take Two today then one once daily  #8 x 0   Entered by:   Jim Like   Authorized by:   Deneise Lever MD   Signed by:   Jim Like on 02/12/2008   Method used:   Telephoned to ...       [facility]  [address] [id number]       [address]       [place], [place]  [address]       Ph: [phone] or [phone]       Fax: [phone]   RxID:   [id number] Cathie Hoops ER 8-10 MG/5ML  LQCR (CHLORPHENIRAMINE-HYDROCODONE) take 1 teaspoon by mouth two times a day as needed  #300 ml x 0   Entered by:   Jim Like   Authorized by:   Deneise Lever MD   Signed by:   Jim Like on 02/12/2008   Method used:   Telephoned to ...       [facility]  [address] [id number]       [address]       [place], [place]  [address]       Ph: [phone] or [phone]       Fax: [phone]   RxID:   [id number]

## 2010-08-17 NOTE — Assessment & Plan Note (Signed)
Summary: 4 months/apc   Primary Provider/Referring Provider:  W. Elkins/ Truslow   History of Present Illness:  12/19/08- Allergic rhinitis, rhinosinusitis, asthmatic bronchitis, rheumatoid arthritis Needed antibiotic and diflucan while in [place], but did well for awhile on return. In last 2-3 weeks - cough with yellow, getting worse and waking her at night, nose and throat pressure and drainage, no fever. Neti pot brings out green. Exposed to a lot of Brad Mcgaughy grandchildren with coughs etc.  April 20, 2009-  Allergic rhinits, rhinosinusitis, asthmatic bronchitis, Rheumatoid Arthritis Tussionex and benzonatate have been a help for cough. Some wheeze. last definite purulent discharge was at least 2 weeks ago. Rain clears the air for her.  July 21, 2009- Allergic rhinitis, rhinosinusitis, asthmatic bronchitis, Rheumatoid arthritis After last here, she says cough never really went away. Feels patch of thrush. [place]. Did get flu shot and has had pneumovax twice. Phlegm won't come up easily.Wheezes. Denies chest pain. May have had some fever. Denies feeling reflux.   August 18, 2009- Allergic rhinitis, rhinosinusitis, asthmatic bronchitis, rheumatoid arthritis Thought she was better after last here, but again hoarse with yelloow/ green nasal drainage. Comes from throat- not chest. Gargles and uses Neti pot  less wheeze- chest is better. She asks about retrying diflucan with an antibiotic.   Current Medications (verified): 1)  Singulair 10 Mg  Tabs (Montelukast Sodium) .... One By Mouth Once Daily 2)  Claritin 10 Mg Tabs (Loratadine) .... Take 1 By Mouth Once Daily 3)  Verapamil Hcl Cr 240 Mg  Tbcr (Verapamil Hcl) .... One By Mouth Once Daily 4)  Klor-Con 20 Meq Pack (Potassium Chloride) .... Take 1 Tablet By Mouth Four Times A Day 5)  Triamterene-Hctz 75-50 Mg  Tabs (Triamterene-Hctz) .... Take 1-2  Tablet By Mouth Once A Day 6)  Clidinium-Chlordiazepoxide 2.5-5 Mg  Caps  (Clidinium-Chlordiazepoxide) .... Three Times A Day 7)  Nexium 40 Mg  Cpdr (Esomeprazole Magnesium) .... Take 1 By Mouth Once Daily 8)  Flonase 50 Mcg/act  Susp (Fluticasone Propionate) .... 2 Puffs in A.m. 9)  Vitamin D 50000 Unit  Caps (Ergocalciferol) .... One By Mouth Every Two Weeks 10)  Fosamax Plus D 70-5600 Mg-Unit  Tabs (Alendronate-Cholecalciferol) .... Once A Week 11)  Symbicort 160-4.5 Mcg/act  Aero (Budesonide-Formoterol Fumarate) .... Two Puffs Twice Daily 12)  Proair Hfa 108 (90 Base) Mcg/act Aers (Albuterol Sulfate) .... 2 Puffs Every 4-6 Hours As Needed 13)  Allergy Vaccine 1:10 Go (W-E) .... Once A Week 14)  Epipen 2-Pak 0.3 Mg/0.15m (1:1000)  Devi (Epinephrine Hcl (Anaphylaxis)) .... For Severe Allergic Reaction 15)  Celebrex 200 Mg  Caps (Celecoxib) .... Take 1 Tablet By Mouth Once A Day 16)  Ipratropium-Albuterol 0.5-2.5 (3) Mg/349mSoln (Ipratropium-Albuterol) ...Marland Kitchen 1 Neb Four Times A Day As Needed 17)  Fenofibrate Micronized 134 Mg Caps (Fenofibrate Micronized) .... Take 1 Capsule By Mouth Once A Day 18)  Glycopyrrolate 1 Mg Tabs (Glycopyrrolate) ...Marland Kitchen 1 Tab By Mouth As Needed 19)  Centrum Silver  Tabs (Multiple Vitamins-Minerals) .... Take 1 Tablet By Mouth Once A Day 20)  Xanax 1 Mg Tabs (Alprazolam) .... Take 1 Tab By Mouth At Bedtime 21)  Voltaren 1 % Gel (Diclofenac Sodium) .... Apply To Affected Area Four Times A Day 22)  Prozac 20 Mg Caps (Fluoxetine Hcl) .... Take 1 By Mouth Once Daily 23)  Tramadol Hcl 50 Mg Tabs (Tramadol Hcl) .... 2 Tablets Every 6 Hrs As Needed 24)  Bd Tb Syringe 27g  X 1/2" 0.5 Ml Misc (Tuberculin-Allergy Syringes) .... Use To Give Allergy Vaccine or Other Medications As Directed 25)  Benzonatate 100 Mg Caps (Benzonatate) .... Take 1 Capsule Four Times A Day As Needed 26)  Chlorzoxazone 500 Mg Tabs (Chlorzoxazone) .... Take 1 By Mouth Every 6 Hours As Needed 27)  Allegra 180 Mg Tabs (Fexofenadine Hcl) .Marland Kitchen.. 1 Daily As Needed  Antihistamine  Allergies (verified): 1)  ! * Oravig 2)  Erythromycin  Past History:  Past Medical History: Last updated: 03/03/2008 REFLUX, ESOPHAGEAL (ICD-530.81) ASTHMATIC BRONCHITIS, ACUTE (ICD-466.0) RHEUMATOID ARTHRITIS (ICD-714.0) ALLERGIC RHINITIS (ICD-477.9)    Past Surgical History: Last updated: 03/03/2008 Fusion left ankle- 2009  Family History: Last updated: 03/10/2008 Mother- living age 82; heart disease, arthritis. Father- deceased age 43; heart attack. Sibling 1- living age 54; DM, heart attack  Social History: Last updated: 03/10/2008 Patient never smoked.  Positive history of passive tobacco smoke exposure.  Exercise- at least 2 times weekly Caffeine-2 cups in morning ETOH-glass of wine each night. Married with 2 children.   Risk Factors: Smoking Status: never (03/03/2008) Passive Smoke Exposure: yes (03/10/2008)  Review of Systems      See HPI  The patient denies anorexia, fever, weight loss, weight gain, vision loss, decreased hearing, hoarseness, chest pain, syncope, dyspnea on exertion, peripheral edema, prolonged cough, headaches, hemoptysis, and severe indigestion/heartburn.    Vital Signs:  Patient profile:   65 year old female Height:      62 inches Weight:      147.25 pounds BMI:     27.03 O2 Sat:      98 % on Room air Pulse rate:   93 / minute BP sitting:   128 / 76  (left arm) Cuff size:   regular  Vitals Entered By: Clayborne Dana CMA (August 18, 2009 1:58 PM)  O2 Flow:  Room air  Physical Exam  Additional Exam:  General: A/Ox3; pleasant and cooperative, NAD, overweight, talkative, she looks as if she feels better. 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  Orders: Est. Patient Level III (19417)  Medications Added to Medication List This Visit: 1)  Fluconazole 150 Mg Tabs (Fluconazole) .Marland Kitchen.. 1 daily 2)  Mycelex 10 Mg Troc (Clotrimazole) .... Melt in mouth twice daily 3)  Amoxicillin-pot Clavulanate 875-125 Mg Tabs (Amoxicillin-pot clavulanate) .Marland Kitchen.. 1 twice daily  Patient Instructions: 1)  Please schedule a follow-up appointment in 4 months. 2)  Scripts sent for generic augmentin, diflucan and troches  Prescriptions: AMOXICILLIN-POT CLAVULANATE 875-125 MG TABS (AMOXICILLIN-POT CLAVULANATE) 1 twice daily  #20 x 0   Entered and Authorized by:   Deneise Lever MD   Signed by:   Deneise Lever MD on 08/18/2009   Method used:   Electronically to        [id number] (retail)       [address]  [place], [place]  [address]       Ph: [phone] or [phone]       Fax: [phone]   RxID:   [id number] MYCELEX 10 MG TROC (CLOTRIMAZOLE) melt in mouth twice daily  #14 x prn   Entered and Authorized by:   Deneise Lever MD   Signed by:   Deneise Lever MD on 08/18/2009   Method used:   Electronically to        [id number]* (retail)       [address]       [place], [place]  [address]       Ph: [phone] or [phone]       Fax: [phone]   RxID:   [id number] FLUCONAZOLE 150 MG TABS (FLUCONAZOLE) 1 daily  #7 x 1   Entered and Authorized by:   Deneise Lever MD   Signed by:   Deneise Lever MD on 08/18/2009   Method used:   Electronically to        [id number]* (retail)       [address]        [place], [place]  [address]       Ph: [phone] or [phone]       Fax: [phone]   RxID:   [id number]

## 2010-08-17 NOTE — Consult Note (Signed)
Summary: [facility]   Imported By: Jerrye Noble D'jimraou 02/20/2008 14:54:45  _____________________________________________________________________  External Attachment:    Type:   Image     Comment:   External Document

## 2010-08-17 NOTE — Assessment & Plan Note (Signed)
Summary: rov 4 months////kp   PCP:  Viona Gilmore Elkins/ [place]  Chief Complaint:  follow up.  History of Present Illness: Current Problems:  ANXIETY DEPRESSION (ICD-300.4) REFLUX, ESOPHAGEAL (ICD-530.81) ASTHMATIC BRONCHITIS, ACUTE (ICD-466.0) RHEUMATOID ARTHRITIS (ICD-714.0) ALLERGIC RHINITIS (ICD-477.9)  From 03/25/08- 65 year old woman returning for follow-up of allergic rhinitis and asthmatic bronchitis complicated by rheumatoid arthritis and reflux.  Very depressed.  Left foot stress fracture.  She was just beginning to get active again.  Orthopedist has put her on prednisone taper over the foot.  She feels unable to manage symbicort as an inhaler dwillespite counseling.  We discussed use of a nebulizer machine instead.  05/15/08- Dr. Berenice Primas in [facility] has been a big help with depression and frustration related to health probs. Had to postpone trip to [place]- left ankle pain- Dr Beola Cord.Laverle Hobby using nebulizer- about twice daily, improves sleep comfort. Still sense "hanging" in back of throat. Rattling cough is sometimes productive. Denies significant wheeze, chest pain or palpitation, reflux, fever. Discussed Neti pot.         Prior Medications Reviewed Using: List Brought by Patient  Updated Prior Medication List: SINGULAIR 10 MG  TABS (MONTELUKAST SODIUM) one by mouth once daily ALLEGRA-D 12 HOUR 60-120 MG  TB12 (FEXOFENADINE-PSEUDOEPHEDRINE) Take 1 tablet by mouth two times a day VERAPAMIL HCL CR 240 MG  TBCR (VERAPAMIL HCL) one by mouth once daily LIPITOR 10 MG  TABS (ATORVASTATIN CALCIUM) 1/2 once daily KLOR-CON 20 MEQ PACK (POTASSIUM CHLORIDE) Take 1 tablet by mouth four times a day TRIAMTERENE-HCTZ 75-50 MG  TABS (TRIAMTERENE-HCTZ) three times a day CLIDINIUM-CHLORDIAZEPOXIDE 2.5-5 MG  CAPS (CLIDINIUM-CHLORDIAZEPOXIDE) three times a day NEXIUM 40 MG  CPDR (ESOMEPRAZOLE MAGNESIUM) take 1 by mouth once daily FLONASE 50 MCG/ACT  SUSP (FLUTICASONE PROPIONATE) 2 puffs in  a.m. VITAMIN D 16109 UNIT  CAPS (ERGOCALCIFEROL) one by mouth every two weeks FOSAMAX PLUS D 70-5600 MG-UNIT  TABS (ALENDRONATE-CHOLECALCIFEROL) once a week SYMBICORT 80-4.5 MCG/ACT  AERO (BUDESONIDE-FORMOTEROL FUMARATE) two puffs two times a day PROAIR HFA 108 (90 BASE) MCG/ACT AERS (ALBUTEROL SULFATE) 2 puffs every 4-6 hours as needed DARVOCET-N 50 50-325 MG  TABS (PROPOXYPHENE N-APAP) 1-3 tab by mouth as needed * ALLERGY VACCINE 1:10 GO (W-E) once a week EPIPEN 2-PAK 0.3 MG/0.3ML (1:1000)  DEVI (EPINEPHRINE HCL (ANAPHYLAXIS)) For severe allergic reaction ZYRTEC ALLERGY 10 MG  TABS (CETIRIZINE HCL) take 1 by mouth at bedtime CELEBREX 200 MG  CAPS (CELECOXIB) two times a day PATANASE 0.6 % SOLN (OLOPATADINE HCL) 1-2 sprays each nostril up to twice daily as needed IPRATROPIUM-ALBUTEROL 0.5-2.5 (3) MG/3ML SOLN (IPRATROPIUM-ALBUTEROL) 1 neb four times a day as needed FENOFIBRATE MICRONIZED 134 MG CAPS (FENOFIBRATE MICRONIZED) Take 1 capsule by mouth once a day GLYCOPYRROLATE 1 MG TABS (GLYCOPYRROLATE) 1 tab by mouth as needed CENTRUM SILVER  TABS (MULTIPLE VITAMINS-MINERALS) Take 1 tablet by mouth once a day XANAX 1 MG TABS (ALPRAZOLAM) Take 1 tab by mouth at bedtime VOLTAREN 1 % GEL (DICLOFENAC SODIUM) apply to affected area four times a day  Current Allergies (reviewed today): ERYTHROMYCIN  Past Medical History:    Reviewed history from 03/03/2008 and no changes required:       REFLUX, ESOPHAGEAL (ICD-530.81)       ASTHMATIC BRONCHITIS, ACUTE (ICD-466.0)       RHEUMATOID ARTHRITIS (ICD-714.0)       ALLERGIC RHINITIS (ICD-477.9)             Review of Systems      See HPI   Vital  Signs:  Patient Profile:   65 Years Old Female Weight:      178 pounds O2 Sat:      96 % O2 treatment:    Room Air Pulse rate:   96 / minute BP sitting:   128 / 72  (left arm) Cuff size:   regular  Vitals Entered By: Parke Poisson CNA (May 15, 2008 1:45 PM)             Is Patient  Diabetic? No Comments Medications reviewed with patient Parke Poisson CNA  May 15, 2008 1:45 PM      Physical Exam  General: A/Ox3; pleasant and cooperative, NAD, overweight SKIN: no rash, lesions NODES: no lymphadenopathy HEENT: [place]/AT, EOM- WNL, Conjuctivae- clear, PERRLA, TM-WNL, Nose- clear, Throat- somewhat reddened. NECK: Supple w/ fair ROM, JVD- none, normal carotid impulses w/o bruits Thyroid- normal to palpation CHEST:very minimal wheeze HEART: RRR, no m/g/r heard ABDOMEN: Soft and nl;  PTW:SFKC, nl pulses, no edema , left foot in a walking boot NEURO: Grossly intact to observation         Impression & Recommendations:  Problem # 1:  ASTHMATIC BRONCHITIS, ACUTE (ICD-466.0) Now on Symbicort 80/4.5. Will try incr to 160. med talk.  The following medications were removed from the medication list:    Tussionex Pennkinetic Er 8-10 Mg/80m Lqcr (Chlorpheniramine-hydrocodone) ..Marland Kitchen.. Take 1 teaspoon by mouth two times a day as needed    Benzonatate 100 Mg Caps (Benzonatate) ..Marland Kitchen.. 1 four times a day as needed  Her updated medication list for this problem includes:    Singulair 10 Mg Tabs (Montelukast sodium) ..... One by mouth once daily    Allegra-d 12 Hour 60-120 Mg Tb12 (Fexofenadine-pseudoephedrine) ..Marland Kitchen.. Take 1 tablet by mouth two times a day    Symbicort 80-4.5 Mcg/act Aero (Budesonide-formoterol fumarate) ..Marland Kitchen..Marland KitchenTwo puffs two times a day    Proair Hfa 108 (90 Base) Mcg/act Aers (Albuterol sulfate) ..Marland Kitchen.. 2 puffs every 4-6 hours as needed    Ipratropium-albuterol 0.5-2.5 (3) Mg/313mSoln (Ipratropium-albuterol) ...Marland Kitchen. 1 neb four times a day as needed   Medications Added to Medication List This Visit: 1)  Singulair 10 Mg Tabs (Montelukast sodium) .... One by mouth once daily 2)  Allegra-d 12 Hour 60-120 Mg Tb12 (Fexofenadine-pseudoephedrine) .... Take 1 tablet by mouth two times a day 3)  Verapamil Hcl Cr 240 Mg Tbcr (Verapamil hcl) .... One by mouth once daily 4)   Lipitor 10 Mg Tabs (Atorvastatin calcium) .... 1/2 once daily 5)  Klor-con 20 Meq Pack (Potassium chloride) .... Take 1 tablet by mouth four times a day 6)  Triamterene-hctz 75-50 Mg Tabs (Triamterene-hctz) .... Three times a day 7)  Clidinium-chlordiazepoxide 2.5-5 Mg Caps (Clidinium-chlordiazepoxide) .... Three times a day 8)  Vitamin D 50000 Unit Caps (Ergocalciferol) .... One by mouth every two weeks 9)  Fosamax Plus D 70-5600 Mg-unit Tabs (Alendronate-cholecalciferol) .... Once a week 10)  Symbicort 80-4.5 Mcg/act Aero (Budesonide-formoterol fumarate) .... Two puffs two times a day 11)  Proair Hfa 108 (90 Base) Mcg/act Aers (Albuterol sulfate) .... 2 puffs every 4-6 hours as needed 12)  Darvocet-n 50 50-325 Mg Tabs (Propoxyphene n-apap) ...Marland Kitchen 1-3 tab by mouth as needed 13)  Allergy Vaccine 1:10 Go (w-e)  .... Once a week 14)  Fenofibrate Micronized 134 Mg Caps (Fenofibrate micronized) .... Take 1 capsule by mouth once a day 15)  Glycopyrrolate 1 Mg Tabs (Glycopyrrolate) ...Marland Kitchen 1 tab by mouth as needed 16)  Centrum Silver Tabs (Multiple vitamins-minerals) ...Marland KitchenMarland KitchenMarland Kitchen  Take 1 tablet by mouth once a day 17)  Xanax 1 Mg Tabs (Alprazolam) .... Take 1 tab by mouth at bedtime 18)  Voltaren 1 % Gel (Diclofenac sodium) .... Apply to affected area four times a day   Patient Instructions: 1)  Please schedule a follow-up appointment in 3 months. 2)  Try sample Symbicort 160/4.5, 2 puffs and rinse well twice daily 3)  then return to your regular strength or call for script for the 160.   ]

## 2010-08-17 NOTE — Assessment & Plan Note (Signed)
Summary: cough, sore throat / cj   Primary Provider/Referring Provider:  W. Elkins/ [place]  CC:  Acute visit-sore throat, cough-prod-yellow, and sweats and chills x 1 week.Marland Kitchen  History of Present Illness: December 24, 2009- Allergic rhinitis, rhinosinusitis, asthmatic bronchitis, rheumatoid artritis Can't shake the cough and chest congestion. Tried maintenance erythromycin but it just caused loose stools. Tussionex works. Denies any sense of reflux. hears some wheeze. Cough is productive yellow/ clear. needs CXR update.  February 17, 2010- Allergic rhinitis, rhinosinusiitis, asthmatic bronchitis, rheumatoid arthritis Had been doing much better for "quite awhile". But in last few days has felt there is something hanging in throat again, spitting out some mucus. Using Neti pot. Today it was turning yellow. Some headache. Back aches from a pulled muscle. She continues her regular meds and allergy vaccine. Allegra-D helps but she can't take it at night. asks refill tussionex- using small amt sometimes at night. They have refurbished old house, packed, moved out and now unpacking at new house where there is new flooring and carpet.  June 14, 2010- Allergic rhinitis, rhinosinusiitis, asthmatic bronchitis, rheumatoid arthritis Nurse-CC: Acute visit-sore throat, cough-prod-yellow, sweats and chills x 1 week. Acute visit Exhausted by family responsibilities. Coughing yellow, sweats and chills, sore throat x 1 week. Can't get sleep because of her cough.   Asthma History    Initial Asthma Severity Rating:    Age range: 12+ years    Symptoms: daily    Nighttime Awakenings: 3-4/month    Interferes w/ normal activity: some limitations    SABA use (not for EIB): several times per day    Asthma Severity Assessment: Severe Persistent   Preventive Screening-Counseling & Management  Alcohol-Tobacco     Smoking Status: never     Passive Smoke Exposure: yes  Current Medications (verified): 1)   Singulair 10 Mg  Tabs (Montelukast Sodium) .... One By Mouth Once Daily 2)  Verapamil Hcl Cr 240 Mg  Tbcr (Verapamil Hcl) .... One By Mouth Once Daily 3)  Klor-Con 20 Meq Pack (Potassium Chloride) .... Take 1 Tablet By Mouth Four Times A Day 4)  Triamterene-Hctz 75-50 Mg  Tabs (Triamterene-Hctz) .... Take 1-2  Tablet By Mouth Once A Day 5)  Clidinium-Chlordiazepoxide 2.5-5 Mg  Caps (Clidinium-Chlordiazepoxide) .... Three Times A Day 6)  Nexium 40 Mg  Cpdr (Esomeprazole Magnesium) .... Take 1 By Mouth Once Daily 7)  Flonase 50 Mcg/act  Susp (Fluticasone Propionate) .... 2 Puffs in A.m. 8)  Vitamin D 50000 Unit  Caps (Ergocalciferol) .... One By Mouth Every Two Weeks 9)  Fosamax Plus D 70-5600 Mg-Unit  Tabs (Alendronate-Cholecalciferol) .... Once A Week 10)  Proair Hfa 108 (90 Base) Mcg/act Aers (Albuterol Sulfate) .... 2 Puffs Every 4-6 Hours As Needed 11)  Allergy Vaccine 1:10 Go (W-E) .... Once A Week 12)  Epipen 2-Pak 0.3 Mg/0.28m Devi (Epinephrine) .... For Severe Allergic Reaction 13)  Celebrex 200 Mg  Caps (Celecoxib) .... Take 1 Tablet By Mouth Once A Day 14)  Ipratropium-Albuterol 0.5-2.5 (3) Mg/34mSoln (Ipratropium-Albuterol) ...Marland Kitchen 1 Neb Four Times A Day As Needed 15)  Fenofibrate Micronized 134 Mg Caps (Fenofibrate Micronized) .... Take 1 Capsule By Mouth Once A Day 16)  Glycopyrrolate 1 Mg Tabs (Glycopyrrolate) ...Marland Kitchen 1 Tab By Mouth As Needed 17)  Centrum Silver  Tabs (Multiple Vitamins-Minerals) .... Take 1 Tablet By Mouth Once A Day 18)  Xanax 1 Mg Tabs (Alprazolam) .... Take 1 Tab By Mouth At Bedtime 19)  Voltaren 1 % Gel (Diclofenac  Sodium) .... Apply To Affected Area Four Times A Day 20)  Prozac 20 Mg Caps (Fluoxetine Hcl) .... Take 1 By Mouth Once Daily 21)  Tramadol Hcl 50 Mg Tabs (Tramadol Hcl) .... 2 Tablets Every 6 Hrs As Needed 22)  Bd Tb Syringe 27g X 1/2" 0.5 Ml Misc (Tuberculin-Allergy Syringes) .... Use To Give Allergy Vaccine or Other Medications As Directed 23)   Benzonatate 100 Mg Caps (Benzonatate) .... Take 1 Capsule Four Times A Day As Needed 24)  Chlorzoxazone 500 Mg Tabs (Chlorzoxazone) .... Take 1 By Mouth Every 6 Hours As Needed 25)  Fluconazole 150 Mg Tabs (Fluconazole) .Marland Kitchen.. 1 Daily 26)  Mycelex 10 Mg Troc (Clotrimazole) .... Melt in Mouth Twice Daily 27)  Nasalcrom 5.2 Mg/act Aers (Cromolyn Sodium) .Marland Kitchen.. 1-2 Sprays Each Nostril Four Times A Day / As Directed  Allergies (verified): 1)  ! * Oravig 2)  ! Lowella Dell  Past History:  Past Medical History: Last updated: 03/03/2008 REFLUX, ESOPHAGEAL (ICD-530.81) ASTHMATIC BRONCHITIS, ACUTE (ICD-466.0) RHEUMATOID ARTHRITIS (ICD-714.0) ALLERGIC RHINITIS (ICD-477.9)    Past Surgical History: Last updated: 03/03/2008 Fusion left ankle- 2009  Family History: Last updated: 03/10/2008 Mother- living age 20; heart disease, arthritis. Father- deceased age 66; heart attack. Sibling 1- living age 18; DM, heart attack  Social History: Last updated: 03/10/2008 Patient never smoked.  Positive history of passive tobacco smoke exposure.  Exercise- at least 2 times weekly Caffeine-2 cups in morning ETOH-glass of wine each night. Married with 2 children.   Risk Factors: Smoking Status: never (06/14/2010) Passive Smoke Exposure: yes (06/14/2010)  Review of Systems      See HPI       The patient complains of productive cough, non-productive cough, nasal congestion/difficulty breathing through nose, and sneezing.  The patient denies shortness of breath with activity, shortness of breath at rest, coughing up blood, chest pain, irregular heartbeats, acid heartburn, indigestion, loss of appetite, weight change, abdominal pain, difficulty swallowing, sore throat, tooth/dental problems, and headaches.    Vital Signs:  Patient profile:   65 year old female Height:      62 inches Weight:      168 pounds BMI:     30.84 O2 Sat:      94 % on Room air Temp:     97.3 degrees F oral Pulse rate:   84 /  minute BP sitting:   118 / 70  (left arm) Cuff size:   regular  Vitals Entered By: Clayborne Dana CMA (June 14, 2010 4:24 PM)  O2 Flow:  Room air CC: Acute visit-sore throat, cough-prod-yellow, sweats and chills x 1 week.   Physical Exam  Additional Exam:  General: A/Ox3; pleasant and cooperative, NAD, overweight, talkative,  SKIN: no rash, lesions NODES: no lymphadenopathy HEENT: [place] [address]/AT, EOM- WNL, Conjuctivae- clear, PERRLA, TM-WNL, Nose- stuffy, Throat-  coated tongue, not hoarse, no stridor NECK: Supple w/ fair ROM, JVD- none, normal carotid impulses w/o bruits Thyroid- CHEST: quiet, not coughing HEART: RRR, no m/g/r heard UGQ:BVQX, nl pulses, no edema  NEURO: Grossly intact to observation     Impression & Recommendations:  Problem # 1:  TRACHEOBRONCHITIS (ICD-490)  Viral pattern exacerbation, aggravated by stress and fatigue.  She emphasizes the family stresses, not resolved by move to this home. We reviewed options and distinguished between long term control and acute flare. The following medications were removed from the medication list:    Augmentin 875-125 Mg Tabs (Amoxicillin-pot clavulanate) .Marland Kitchen... Take 1 tablet by mouth two times a  day    Tussionex Pennkinetic Er 8-10 Mg/30m Lqcr (Chlorpheniramine-hydrocodone) ..Marland Kitchen.. 1 teaspoon two times a day as needed cough Her updated medication list for this problem includes:    Singulair 10 Mg Tabs (Montelukast sodium) ..... One by mouth once daily    Proair Hfa 108 (90 Base) Mcg/act Aers (Albuterol sulfate) ..Marland Kitchen.. 2 puffs every 4-6 hours as needed    Ipratropium-albuterol 0.5-2.5 (3) Mg/343mSoln (Ipratropium-albuterol) ...Marland Kitchen. 1 neb four times a day as needed    Benzonatate 100 Mg Caps (Benzonatate) ...Marland Kitchen. Take 1 capsule four times a day as needed    Tussionex Pennkinetic Er 10-8 Mg/40m3mqcr (Hydrocod polst-chlorphen polst) ....Marland Kitchen 1 teaspoon . two times a day    Amoxicillin-pot Clavulanate 875-125 Mg Tabs (Amoxicillin-pot  clavulanate) ....Marland Kitchen 1 two times a day  Problem # 2:  THRUSH (ICD-112.0) She routinely expects thrush with antibiotics & steroids. Discussed management.  Medications Added to Medication List This Visit: 1)  Tussionex Pennkinetic Er 10-8 Mg/40ml60mcr (Hydrocod polst-chlorphen polst) .... Marland Kitchen teaspoon . two times a day 2)  Amoxicillin-pot Clavulanate 875-125 Mg Tabs (Amoxicillin-pot clavulanate) .... Marland Kitchen two times a day  Other Orders: Est. Patient Level IV (992(01751min of Therapeutic Inj  intramuscular or subcutaneous (963(02585po- Medrol 80mg20m040) Nebulizer Tx (9464(27782tient Instructions: 1)  Please schedule a follow-up appointment in 6 months. 2)  Neb xop 1.25 3)  depo 80 4)  scripts for tussionex and for augmentin Prescriptions: FLUCONAZOLE 150 MG TABS (FLUCONAZOLE) 1 daily  #5 x 0   Entered by:   KatieClayborne Dana  Authorized by:   ClintDeneise Lever Signed by:   KatieClayborne Danaon 06/14/2010   Method used:   [facility]  [phone]       [address] [id number]  Ph: [phone]  Fax: [phone]:   1:   [id number]-POT CLAVULANATE 875-125 MG TABS (AMOXICILLIN-POT CLAVULANATE) 1 two times a day  #14 x 0   Entered and Authorized by:   ClintDeneise Lever Signed by:   ClintDeneise Levern 06/14/2010   Method used:   Print then Give to Patient   RxID:   [id number] Town0-8 MG/5ML LQCR (HYDROCOD POLST-CHLORPHEN POLST) 1 teaspoon . two times a day  #200 ml x 0   Entered and Authorized by:   ClintDeneise Lever Signed by:   ClintDeneise Levern 06/14/2010   Method used:   Print then Give to Patient   RxID:   [id number] Medication Administration  Injection # 1:    Medication: Depo- Medrol 80mg 53miagnosis: TRACHEOBRONCHITIS (ICD-490)    Route: SQ    Site: LUOQ gluteus    Exp Date: 11/2012    Lot #: [id number]    Mfr:  Pharmacia    Patient tolerated injection without complications    Given by: [name] Clayborne DanaNovember 28, 2011 5:21 PM)  Medication # 1:    Medication: Xopenex 1.240mg  78magnosis: TRACHEOBRONCHITIS (ICD-490)    Dose: 1 vial    Route: inhaled    Exp Date: 02/2011    Lot #: [id number]: Sepracor    Patient tolerated medication  without complications    Given by: Clayborne Dana CMA (June 14, 2010 5:21 PM)  Orders Added: 1)  Est. Patient Level IV [67124] 2)  Admin of Therapeutic Inj  intramuscular or subcutaneous [96372] 3)  Depo- Medrol 40m [[P8099]4)  Nebulizer Tx [[83382]

## 2010-08-17 NOTE — Progress Notes (Signed)
Summary: talk to nurse  Phone Note Call from Patient Call back at Home Phone [phone]   Caller: Patient Call For: Hristopher Missildine Reason for Call: Talk to Nurse Summary of Call: Pt states CY switched her from nasonex to nasalcrom 5.49m , he said they were different meds, however the pharmacy said they are the same and won't fill until 5/28, pls advise. Initial call taken by: JNetta Neat  Dec 08, 2009 11:35 AM  Follow-up for Phone Call        according to last OV note nasalcrom is OTC, so I advised pt the rx sent was for flonase but she can get nasalcrom OTC. she states she will do this.JRandolph BingCMA  Dec 08, 2009 11:54 AM

## 2010-08-17 NOTE — Progress Notes (Signed)
Summary: refill on allergy syringes  Phone Note Call from Patient Call back at Home Phone [phone]   Caller: Patient Call For: [name] Summary of Call: Wants [name] to call in the syringes for her allergy medication.//[email or web address] Initial call taken by: Netta Neat,  April 01, 2010 11:32 AM  Follow-up for Phone Call        refill sent. pt advised. [place] Bing CMA  April 01, 2010 11:36 AM     Prescriptions: BD TB SYRINGE 27G X 1/2" 0.5 ML MISC (TUBERCULIN-ALLERGY SYRINGES) use to give allergy vaccine or other medications as directed  #100 x 11   Entered by:   [place] Bing CMA   Authorized by:   Deneise Lever MD   Signed by:   [place] Bing CMA on 04/01/2010   Method used:   Electronically to        [facility]  [phone]* (retail)       [place], [place]  [address]       Ph: [phone] or [phone]       Fax: [phone]   RxID:   [id number]

## 2010-08-17 NOTE — Progress Notes (Signed)
Summary: pt out of meds/ frustrated/ needs asap  Phone Note Call from Patient Call back at Home Phone [phone]   Caller: Patient Call For: [name] Summary of Call: pt [facility] [address]- that they have not refilled her rx' s for ipratropium alb/ neb or proair HFA because they have not heard from dr Janee Morn nurse. pt says this has happened several times and she is getting frustrated. also says that she is completely out of both of these and could not use her neb yesterday. please call the pharm and find out what the problem is and call pt asap so that she can pick her rx's up. Initial call taken by: Cooper Render,  May 04, 2009 2:22 PM  Follow-up for Phone Call        Pt advised she requested refills on above medications this weekend and pharmacy has not received approval from [name] office. I advised pt with this being Monday and her request being on the weekend, if it was a fax maybe it has not cycle through yet and that there is no electronic request for same. Rx refills sent to pharmacy.  Follow-up by: Iran Planas CMA,  May 04, 2009 3:44 PM    Prescriptions: IPRATROPIUM-ALBUTEROL 0.5-2.5 (3) MG/3ML SOLN (IPRATROPIUM-ALBUTEROL) 1 neb four times a day as needed  #50 x prn   Entered by:   Iran Planas CMA   Authorized by:   Deneise Lever MD   Signed by:   Iran Planas CMA on 05/04/2009   Method used:   Electronically to        [id number] (retail)       [address]       [place], [place]  [address]       Ph: [phone] or [phone]       Fax: [phone]   RxID:   [id number] PROAIR HFA 108 (90 BASE) MCG/ACT AERS (ALBUTEROL SULFATE) 2 puffs every 4-6 hours as needed  #1 x 2   Entered by:   Iran Planas CMA   Authorized by:   Deneise Lever MD   Signed by:   Iran Planas CMA on 05/04/2009   Method used:   Electronically to        [id number]* (retail)       [address]       [place], [place]  [address]       Ph: [phone] or [phone]       Fax: [phone]   RxID:   [id number]

## 2010-08-17 NOTE — Assessment & Plan Note (Signed)
Summary: SHINGLES/ MBW   PCP:  W. Elkins/ [place]  Chief Complaint:  Pt is here for a sick visit.  Pt c/o sore throat, shingles, and and nasal drainage.Marland Kitchen  History of Present Illness: Current Problems:  ANXIETY DEPRESSION (ICD-300.4) REFLUX, ESOPHAGEAL (ICD-530.81) ASTHMATIC BRONCHITIS, ACUTE (ICD-466.0) RHEUMATOID ARTHRITIS (ICD-714.0) ALLERGIC RHINITIS (ICD-477.9)  05/15/08- From 03/25/08- 65 year old woman returning for follow-up of allergic rhinitis and asthmatic bronchitis complicated by rheumatoid arthritis and reflux.  Very depressed.  Left foot stress fracture.  She was just beginning to get active again.  Orthopedist has put her on prednisone taper over the foot.  She feels unable to manage symbicort as an inhaler dwillespite counseling.  We discussed use of a nebulizer machine instead.  05/15/08- [facility] Berenice Primas [facility] has been a big help with depression and frustration related to health probs. Had to postpone trip to [place]- left ankle pain- Dr Beola Cord.Laverle Hobby using nebulizer- about twice daily, improves sleep comfort. Still sense "hanging" in back of throat. Rattling cough is sometimes productive. Denies significant wheeze, chest pain or palpitation, reflux, fever. Discussed Neti pot.  07/04/08- Allergic rhinitis, asthmatic bronchitis, RA Has recurrent shingles despite vaccine. Now on Acyclovir, just finished and switched to prednisone taper, managed by Dr Arelia Sneddon. Breathing pretty well on prednisone. Now 1 week sore throat. Not definitie fever.Nonpurulent but blowing mucus. Paxil has been big help with mood but still can't sleep well at night. Also helps to be able to talk to Dr Berenice Primas in [facility].            Current Allergies: ERYTHROMYCIN  Past Medical History:    Reviewed history from 03/03/2008 and no changes required:       REFLUX, ESOPHAGEAL (ICD-530.81)       ASTHMATIC BRONCHITIS, ACUTE (ICD-466.0)       RHEUMATOID ARTHRITIS (ICD-714.0)  ALLERGIC RHINITIS (ICD-477.9)          Past Surgical History:    Reviewed history from 03/03/2008 and no changes required:       Fusion left ankle- 2009   Social History:    Reviewed history from 03/10/2008 and no changes required:       Patient never smoked.        Positive history of passive tobacco smoke exposure.        Exercise- at least 2 times weekly       Caffeine-2 cups in morning       ETOH-glass of wine each night.       Married with 2 children.     Review of Systems      See HPI       Denies headache, , sneezing chest pain, dyspnea, n/v/d, weight loss, fever, edema.     Vital Signs:  Patient Profile:   65 Years Old Female Weight:      174 pounds O2 Sat:      99 % O2 treatment:    Room Air Pulse rate:   90 / minute BP sitting:   138 / 76  (left arm) Cuff size:   regular  Vitals Entered By: Matthew Folks LPN (July 04, 2777 10:30 AM)                 Physical Exam  General: A/Ox3; pleasant and cooperative, NAD, SKIN: no rash, lesions NODES: no lymphadenopathy HEENT: /AT, EOM- WNL, Conjuctivae- clear, PERRLA, TM-WNL, Nose- clear, Throat- petechial areas and white patches on hard palate NECK: Supple w/ fair ROM, JVD- none, normal carotid impulses w/o  bruits Thyroid- normal to palpation CHEST: Clear to P&A HEART: RRR, no m/g/r heard ABDOMEN: Soft and nl; nml bowel sounds; no organomegaly or masses noted QQP:YPPJ, nl pulses, no edema  NEURO: Grossly intact to observation         Impression & Recommendations:  Problem # 1:  ASTHMATIC BRONCHITIS, ACUTE (ICD-466.0) Recent shingles, but AI think we are seeing thrush in mouth. Plan: Diflucan. Bback up with doxycycline in case of possible strep.Finish pred taper. Script for doxy and diflucan. Her updated medication list for this problem includes:    Singulair 10 Mg Tabs (Montelukast sodium) ..... One by mouth once daily    Allegra-d 12 Hour 60-120 Mg Tb12 (Fexofenadine-pseudoephedrine) .Marland Kitchen...  Take 1 tablet by mouth two times a day    Symbicort 160-4.5 Mcg/act Aero (Budesonide-formoterol fumarate) .Marland Kitchen..Marland Kitchen Two puffs twice daily    Proair Hfa 108 (90 Base) Mcg/act Aers (Albuterol sulfate) .Marland Kitchen... 2 puffs every 4-6 hours as needed    Ipratropium-albuterol 0.5-2.5 (3) Mg/81m Soln (Ipratropium-albuterol) ..Marland Kitchen.. 1 neb four times a day as needed    Doxycycline Hyclate 100 Mg Caps (Doxycycline hyclate) ..Marland Kitchen.. 2 today then one daily   Problem # 2:  RHEUMATOID ARTHRITIS (ICD-714.0) Assume immune supression by her rheumatoid disease and treatment  Medications Added to Medication List This Visit: 1)  Triamterene-hctz 75-50 Mg Tabs (Triamterene-hctz) .... Take 1 tablet by mouth once a day 2)  Celebrex 200 Mg Caps (Celecoxib) .... Take 1 tablet by mouth once a day 3)  Patanase 0.6 % Soln (Olopatadine hcl) ..Marland Kitchen. 1-2 sprays each nostril up to twice daily as needed 4)  Paxil 20 Mg Tabs (Paroxetine hcl) .... Take 1 1/2 tabs by mouth daily 5)  Doxycycline Hyclate 100 Mg Caps (Doxycycline hyclate) .... 2 today then one daily 6)  Fluconazole 150 Mg Tabs (Fluconazole) ..Marland Kitchen. 1 daily   Patient Instructions: 1)  Keep appt 2)  scripts for antibiotic and diflucan went to your drug store   Prescriptions: FLUCONAZOLE 150 MG TABS (FLUCONAZOLE) 1 daily  #7 x 1   Entered and Authorized by:   CDeneise LeverMD   Signed by:   CDeneise LeverMD on 07/04/2008   Method used:   Electronically to        [id number] ([facility]ail)       [address]       [place]  [address]      Ph: [phone] [phone]      Fax: [phone]  RxID:   [id number] HYCLATE 100 MG CAPS (DOXYCYCLINE HYCLATE) 2 today then one daily  #8 x 1   Entered and Authorized by:   CDeneise LeverMD   Signed by:   CDeneise LeverMD on 07/04/2008   Method used:   Electronically to        [id number] ([facility]       [address]       [place]  [address]      Ph: [phone] [phone]      Fax: [phone]  RxID:   [id number] ]

## 2010-08-17 NOTE — Letter (Signed)
Summary: [facility]  [facility] [place] By: Jerrye Noble D'jimraou 05/09/2008 11:43:53  _____________________________________________________________________  External Attachment:    Type:   Image     Comment:   External Document

## 2010-08-17 NOTE — Progress Notes (Signed)
Summary: nebulizer med  Phone Note Call from Patient   Caller: Patient Call For: young Summary of Call: have questions about nebulizer machine [facility] medicine [facility] [phone] Initial call taken by: Gustavus Bryant,  April 03, 2008 3:32 PM  Follow-up for Phone Call        Order faxed to [facility] on 04/01/08 for albut/iprat four times a day as needed SOB and neb machine to [place].  Pt's neb machine delivered, but no meds yet.  Advised pt to call [facility] to find out status of meds and to arrange instruct on neb machine. Follow-up by: Freddrick March RN,  April 03, 2008 3:41 PM

## 2010-08-17 NOTE — Assessment & Plan Note (Signed)
Summary: flu shot/klw   Nurse Visit  Flu Vaccine Consent Questions     Do you have a history of severe allergic reactions to this vaccine? no    Any prior history of allergic reactions to egg and/or gelatin? no    Do you have a sensitivity to the preservative Thimersol? no    Do you have a past history of Guillan-Barre Syndrome? no    Do you currently have an acute febrile illness? no    Have you ever had a severe reaction to latex? no    Vaccine information given and explained to patient? yes    Are you currently pregnant? no    Lot Number:[id number]   Site Given  Right Deltoid IM  Clayborne Dana CMA  April 30, 2008 2:51 PM                Prior Medications: SINGULAIR 10 MG  TABS (MONTELUKAST SODIUM) one by mouth qd ALLEGRA-D 12 HOUR 60-120 MG  TB12 (FEXOFENADINE-PSEUDOEPHEDRINE) every morning VERAPAMIL HCL CR 240 MG  TBCR (VERAPAMIL HCL) one by mouth qd LIPITOR 10 MG  TABS (ATORVASTATIN CALCIUM) 1/2 qd POTASSIUM () 20 micrograms qid TRIAMTERENE-HCTZ 75-50 MG  TABS (TRIAMTERENE-HCTZ) tid CLIDINIUM-CHLORDIAZEPOXIDE 2.5-5 MG  CAPS (CLIDINIUM-CHLORDIAZEPOXIDE) tid NEXIUM 40 MG  CPDR (ESOMEPRAZOLE MAGNESIUM) take 1 by mouth once daily FLONASE 50 MCG/ACT  SUSP (FLUTICASONE PROPIONATE) 2 puffs in a.m. VITAMIN D 47308 UNIT  CAPS (ERGOCALCIFEROL) one by mouth q two weeks FOSAMAX PLUS D 70-5600 MG-UNIT  TABS (ALENDRONATE-CHOLECALCIFEROL)  SYMBICORT 80-4.5 MCG/ACT  AERO (BUDESONIDE-FORMOTEROL FUMARATE) two puffs bid ALBUTEROL 90 MCG/ACT  AERS (ALBUTEROL) prn DARVOCET-N 50 50-325 MG  TABS (PROPOXYPHENE N-APAP) 1-3 prn TUSSIONEX PENNKINETIC ER 8-10 MG/5ML  LQCR (CHLORPHENIRAMINE-HYDROCODONE) take 1 teaspoon by mouth two times a day as needed BENZONATATE 100 MG  CAPS (BENZONATATE) 1 four times a day as needed ALLERGY VACCINE 1:10 GO (W-E) ()  EPIPEN 2-PAK 0.3 MG/0.3ML (1:1000)  DEVI (EPINEPHRINE HCL (ANAPHYLAXIS)) For severe allergic reaction ZYRTEC ALLERGY 10 MG  TABS  (CETIRIZINE HCL) take 1 by mouth at bedtime CELEBREX 200 MG  CAPS (CELECOXIB) two times a day PATANASE 0.6 % SOLN (OLOPATADINE HCL) 1-2 sprays each nostril up to twice daily as needed IPRATROPIUM-ALBUTEROL 0.5-2.5 (3) MG/3ML SOLN (IPRATROPIUM-ALBUTEROL) 1 neb four times a day as needed Current Allergies: ERYTHROMYCIN    Orders Added: 1)  Admin 1st Vaccine [90471] 2)  Flu Vaccine 28yr + [[place].[facility]   ]

## 2010-08-17 NOTE — Progress Notes (Signed)
Summary: cough, sore throat > OV scheduled  Phone Note Call from Patient Call back at Home Phone [phone]   Caller: Patient Call For: young Reason for Call: Acute Illness, Talk to Nurse Summary of Call: Patient calling requesting appt. w/ Young.  She c/o cough, yellow mucus x 1 week.   Initial call taken by: Mateo Flow,  June 14, 2010 9:14 AM  Follow-up for Phone Call        Called, spoke with pt.  She c/o prod cough with yellow mucus, sore throat, sweating, drainage x  1 wk.  Requesting OV.  OV scheduled for today at 3:30pm with CY -- pt aware.  Follow-up by: Raymondo Band RN,  June 14, 2010 10:32 AM

## 2010-08-17 NOTE — Assessment & Plan Note (Signed)
Summary: 3 months/apc   PCP:  W. Elkins/ [facility]  Chief Complaint:  3 month follow up visit-asthma; 2 nights ago started coughing-green. Using Netipot and Neb machine. Marland Kitchen  History of Present Illness: Current Problems:  ANXIETY DEPRESSION (ICD-300.4) REFLUX, ESOPHAGEAL (ICD-530.81) ASTHMATIC BRONCHITIS, ACUTE (ICD-466.0) RHEUMATOID ARTHRITIS (ICD-714.0) ALLERGIC RHINITIS (ICD-477.9)  07/04/08- 05/15/08- From 03/25/08- 65 year old woman returning for follow-up of allergic rhinitis and asthmatic bronchitis complicated by rheumatoid arthritis and reflux.  Very depressed.  Left foot stress fracture.  She was just beginning to get active again.  Orthopedist has put her on prednisone taper over the foot.  She feels unable to manage symbicort as an inhaler dwillespite counseling.  We discussed use of a nebulizer machine instead.  05/15/08- Dr. Berenice Primas in [facility] has been a big help with depression and frustration related to health probs. Had to postpone trip to [place]- left ankle pain- Dr Beola Cord.Laverle Hobby using nebulizer- about twice daily, improves sleep comfort. Still sense "hanging" in back of throat. Rattling cough is sometimes productive. Denies significant wheeze, chest pain or palpitation, reflux, fever. Discussed Neti pot.  07/04/08- Allergic rhinitis, asthmatic bronchitis, RA Has recurrent shingles despite vaccine. Now on Acyclovir, just finished and switched to prednisone taper, managed by Dr Arelia Sneddon. Breathing pretty well on prednisone. Now 1 week sore throat. Not definitie fever.Nonpurulent but blowing mucus. Paxil has been big help with mood but still can't sleep well at night. Also helps to be able to talk to Dr Berenice Primas in [place].  08/13/08- Allergic rhinitis, asthmaticbronchitis, RA Stressed- mother GI bleed/diverticulosis. Got run down and chilled caring then for mother. Was feeling well until 2 days ago woke from sleep with cough. Still some mild postnasal drainage,  coughs some mucus. Going to [place] in March.         Prior Medications Reviewed Using: Patient Recall  Updated Prior Medication List: SINGULAIR 10 MG  TABS (MONTELUKAST SODIUM) one by mouth once daily ALLEGRA-D 12 HOUR 60-120 MG  TB12 (FEXOFENADINE-PSEUDOEPHEDRINE) Take 1 tablet by mouth two times a day VERAPAMIL HCL CR 240 MG  TBCR (VERAPAMIL HCL) one by mouth once daily KLOR-CON 20 MEQ PACK (POTASSIUM CHLORIDE) Take 1 tablet by mouth four times a day TRIAMTERENE-HCTZ 75-50 MG  TABS (TRIAMTERENE-HCTZ) Take 1 tablet by mouth once a day CLIDINIUM-CHLORDIAZEPOXIDE 2.5-5 MG  CAPS (CLIDINIUM-CHLORDIAZEPOXIDE) three times a day NEXIUM 40 MG  CPDR (ESOMEPRAZOLE MAGNESIUM) take 1 by mouth once daily FLONASE 50 MCG/ACT  SUSP (FLUTICASONE PROPIONATE) 2 puffs in a.m. VITAMIN D 43154 UNIT  CAPS (ERGOCALCIFEROL) one by mouth every two weeks FOSAMAX PLUS D 70-5600 MG-UNIT  TABS (ALENDRONATE-CHOLECALCIFEROL) once a week SYMBICORT 160-4.5 MCG/ACT  AERO (BUDESONIDE-FORMOTEROL FUMARATE) Two puffs twice daily PROAIR HFA 108 (90 BASE) MCG/ACT AERS (ALBUTEROL SULFATE) 2 puffs every 4-6 hours as needed DARVOCET-N 50 50-325 MG  TABS (PROPOXYPHENE N-APAP) 1-3 tab by mouth as needed * ALLERGY VACCINE 1:10 GO (W-E) once a week EPIPEN 2-PAK 0.3 MG/0.3ML (1:1000)  DEVI (EPINEPHRINE HCL (ANAPHYLAXIS)) For severe allergic reaction ZYRTEC ALLERGY 10 MG  TABS (CETIRIZINE HCL) take 1 by mouth at bedtime CELEBREX 200 MG  CAPS (CELECOXIB) Take 1 tablet by mouth once a day IPRATROPIUM-ALBUTEROL 0.5-2.5 (3) MG/3ML SOLN (IPRATROPIUM-ALBUTEROL) 1 neb four times a day as needed FENOFIBRATE MICRONIZED 134 MG CAPS (FENOFIBRATE MICRONIZED) Take 1 capsule by mouth once a day GLYCOPYRROLATE 1 MG TABS (GLYCOPYRROLATE) 1 tab by mouth as needed CENTRUM SILVER  TABS (MULTIPLE VITAMINS-MINERALS) Take 1 tablet by mouth once a day XANAX 1 MG  TABS (ALPRAZOLAM) Take 1 tab by mouth at bedtime VOLTAREN 1 % GEL (DICLOFENAC SODIUM) apply to  affected area four times a day PAXIL 20 MG TABS (PAROXETINE HCL) take 1 1/2 tabs by mouth daily DOXYCYCLINE HYCLATE 100 MG CAPS (DOXYCYCLINE HYCLATE) 2 today then one daily FLUCONAZOLE 150 MG TABS (FLUCONAZOLE) 1 daily  Current Allergies (reviewed today): ERYTHROMYCIN  Past Medical History:    Reviewed history from 03/03/2008 and no changes required:       REFLUX, ESOPHAGEAL (ICD-530.81)       ASTHMATIC BRONCHITIS, ACUTE (ICD-466.0)       RHEUMATOID ARTHRITIS (ICD-714.0)       ALLERGIC RHINITIS (ICD-477.9)          Past Surgical History:    Reviewed history from 03/03/2008 and no changes required:       Fusion left ankle- 2009   Family History:    Reviewed history from 03/10/2008 and no changes required:       Mother- living age 80; heart disease, arthritis.       Father- deceased age 58; heart attack.       Sibling 1- living age 52; DM, heart attack  Social History:    Reviewed history from 03/10/2008 and no changes required:       Patient never smoked.        Positive history of passive tobacco smoke exposure.        Exercise- at least 2 times weekly       Caffeine-2 cups in morning       ETOH-glass of wine each night.       Married with 2 children.     Review of Systems      See HPI       Denies headache, sinus drainage, sneezing chest pain, dyspnea, n/v/d, weight loss, fever, edema.     Vital Signs:  Patient Profile:   65 Years Old Female Weight:      177.50 pounds O2 Sat:      97 % O2 treatment:    Room Air Pulse rate:   97 / minute BP sitting:   130 / 74  (left arm) Cuff size:   regular  Vitals Entered By: Clayborne Dana CMA (August 13, 2008 11:22 AM)             Comments Medications reviewed with patient Clayborne Dana CMA  August 13, 2008 11:23 AM      Physical Exam  General: A/Ox3; pleasant and cooperative, NAD, SKIN: no rash, lesions NODES: no lymphadenopathy HEENT: [place]/AT, EOM- WNL, Conjuctivae- clear, PERRLA, TM-WNL, Nose- clear, Throat-  petechial areas and white patches on hard palate NECK: Supple w/ fair ROM, JVD- none, normal carotid impulses w/o bruits Thyroid- normal to palpation CHEST: Clear to P&A, very slight cough without wheeze, nonproductive HEART: RRR, no m/g/r heard ABDOMEN: Soft and nl; nml bowel sounds; no organomegaly or masses noted ALP:FXTK, nl pulses, no edema  NEURO: Grossly intact to observation         Impression & Recommendations:  Problem # 1:  ASTHMATIC BRONCHITIS, ACUTE (ICD-466.0) She is concerned that she may quickly slide downhill. We discussed management today for exacerbation. We also discussed airtravel, carrying her nebulizer and meds through security. Her updated medication list for this problem includes:    Singulair 10 Mg Tabs (Montelukast sodium) ..... One by mouth once daily    Allegra-d 12 Hour 60-120 Mg Tb12 (Fexofenadine-pseudoephedrine) .Marland Kitchen... Take 1 tablet by mouth two times a day  Symbicort 160-4.5 Mcg/act Aero (Budesonide-formoterol fumarate) .Marland Kitchen..Marland Kitchen Two puffs twice daily    Proair Hfa 108 (90 Base) Mcg/act Aers (Albuterol sulfate) .Marland Kitchen... 2 puffs every 4-6 hours as needed    Ipratropium-albuterol 0.5-2.5 (3) Mg/16m Soln (Ipratropium-albuterol) ..Marland Kitchen.. 1 neb four times a day as needed    Doxycycline Hyclate 100 Mg Caps (Doxycycline hyclate) ..Marland Kitchen.. 2 today then one daily    Tussionex Pennkinetic Er 8-10 Mg/550mLqcr (Chlorpheniramine-hydrocodone) ...Marland Kitchen. 1 tsp two times a day prn    Doxycycline Hyclate 100 Mg Tabs (Doxycycline hyclate) ...Marland Kitchen. 2 today then one daily   Medications Added to Medication List This Visit: 1)  Tussionex Pennkinetic Er 8-10 Mg/85m3mqcr (Chlorpheniramine-hydrocodone) ....Marland Kitchen1 tsp two times a day prn 2)  Doxycycline Hyclate 100 Mg Tabs (Doxycycline hyclate) .... 2 today then one daily   Patient Instructions: 1)  Please schedule a follow-up appointment befoer your trip 2)  Refilled the Tussionex 3)  script for doxycycline   Prescriptions: DOXYCYCLINE  HYCLATE 100 MG TABS (DOXYCYCLINE HYCLATE) 2 today then one daily  #8 x 0   Entered and Authorized by:   CliDeneise Lever   Signed by:   CliDeneise Lever on 08/13/2008   Method used:   Print then Give to Patient   RxID:   [id number] 8-10 MG/5ML LQCR (CHLORPHENIRAMINE-HYDROCODONE) 1 tsp two times a day prn  #300 ml x 0   Entered and Authorized by:   CliDeneise Lever   Signed by:   CliDeneise Lever on 08/13/2008   Method used:   Print then Give to Patient   RxID:  :   [id number]

## 2010-08-17 NOTE — Progress Notes (Signed)
Summary: directions 7 qty  Phone Note Call from Patient Call back at Home Phone [phone]   Caller: Patient Call For: young Reason for Call: Talk to Nurse, Talk to Doctor Summary of Call: Fluconazole 168m 1 daily for yeast #1 x 0 refills  -  is the quantity on this correct? Initial call taken by: AZigmund Gottron  September 17, 2008 2:00 PM  Follow-up for Phone Call        pt prefers the #7 quantity of the diflucan she says this is what cdy  usually does--pls advise  [facility] September 17, 2008 2:18 PM   Additional Follow-up for Phone Call Additional follow up Details #1::        Done.  Per CY OK to have Diflucan 1076m#7. Additional Follow-up by: ClDeneise LeverD,  September 17, 2008 2:30 PM    Additional Follow-up for Phone Call Additional follow up Details #2::    Called pt advised rx sent to pharmacy.  Sent diflucan rx electronically. Follow-up by: ErFreddrick MarchN,  September 17, 2008 2:58 PM  New/Updated Medications: FLUCONAZOLE 100 MG TABS (FLUCONAZOLE) Take 1 tablet by mouth once a day x 7 days   Prescriptions: FLUCONAZOLE 100 MG TABS (FLUCONAZOLE) Take 1 tablet by mouth once a day x 7 days  #7 x 0   Entered by:   ErFreddrick MarchN   Authorized by:   ClDeneise LeverD   Signed by:   ErFreddrick MarchN on 09/17/2008   Method used:   Electronically to        [id number]       [address]      [place]  [address]     Ph: [phone]     Fax: [phone] RxID:   [id number]

## 2010-08-19 NOTE — Progress Notes (Signed)
Summary: rx req/ sinus--rx sent  Phone Note Call from Patient Call back at Home Phone [phone]   Caller: Patient Call For: young Summary of Call: pt c/o sinus drainage/ yellow mucus x 3 wks. has been taking musinex DM. "may" have a fevers off and on- sweaty at night. [facility] [address] (pt's mother passed recently) pt was feeling better since last seen-says the abx had helped- but since she was around all her realatives for funeral this has returned. call home # above w/in the hour or cell [phone] [facility] [address] Initial call taken by: Cooper Render, CNA,  July 15, 2010 10:30 AM  Follow-up for Phone Call        Spoke with pt.  She is c/o prod cough with yellow sputum and yellow nasal d/c x 2 wks.  She states that she is needing refill on her tussionex and wants to know if needs more abx- had round of augmentin back in Nov and this helped.  Pls advise thanks! allergic to oravig and e-mycin Follow-up by: Tilden Dome,  July 15, 2010 10:42 AM  Additional Follow-up for Phone Call Additional follow up Details #1::        per cdy ok to give tussionex and augmentin 875 #14 1 two times a day  [facility]  July 15, 2010 11:18 AM   Patient aware RX's sent to her pharmacy and to call if her sxs do not improve or get worse.Francesca Jewett [facility]  July 15, 2010 12:05 PM    New/Updated Medications: Cathie Hoops ER 10-8 MG/5ML LQCR (HYDROCOD POLST-CHLORPHEN POLST) 1 teaspoon . two times a day AMOXICILLIN-POT CLAVULANATE 875-125 MG TABS (AMOXICILLIN-POT CLAVULANATE) 1 two times a day Prescriptions: TUSSIONEX PENNKINETIC ER 10-8 MG/5ML LQCR (HYDROCOD POLST-CHLORPHEN POLST) 1 teaspoon . two times a day  #272m x 0   Entered by:   LFrancesca JewettCMA   Authorized by:   CDeneise LeverMD   Signed by:   LFrancesca JewettCMA on 07/15/2010   Method used:   [facility] ...       [phone] [facility]       [address] [place]   [address]      Ph: [phone]      Fax: [phone]  RxID:   [id number]-POT CLAVULANATE 875-125 MG TABS (AMOXICILLIN-POT CLAVULANATE) 1 two times a day  #14 x 0   Entered by:   LFrancesca JewettCMA   Authorized by:   CDeneise LeverMD   Signed by:   LFrancesca JewettCMA on 07/15/2010   Method used:   Electronically to        [facility][phone] (retail)       [address] [place]  [address]      Ph: [phone]      Fax: [phone]  RxID:   [id number]

## 2010-09-03 ENCOUNTER — Encounter (HOSPITAL_COMMUNITY)
Admission: RE | Admit: 2010-09-03 | Discharge: 2010-09-03 | Disposition: A | Discharge status: Home / Self Care | Payer: BC Managed Care – PPO | Source: Ambulatory Visit | Attending: Neurological Surgery | Admitting: Neurological Surgery

## 2010-09-03 ENCOUNTER — Other Ambulatory Visit (HOSPITAL_COMMUNITY): Payer: Self-pay | Admitting: Neurological Surgery

## 2010-09-03 ENCOUNTER — Ambulatory Visit (HOSPITAL_COMMUNITY)
Admission: RE | Admit: 2010-09-03 | Discharge: 2010-09-03 | Disposition: A | Discharge status: Home / Self Care | Payer: BC Managed Care – PPO | Source: Ambulatory Visit | Attending: Neurological Surgery | Admitting: Neurological Surgery

## 2010-09-03 DIAGNOSIS — Z01818 Encounter for other preprocedural examination: Secondary | ICD-10-CM | POA: Insufficient documentation

## 2010-09-03 DIAGNOSIS — J45909 Unspecified asthma, uncomplicated: Secondary | ICD-10-CM | POA: Insufficient documentation

## 2010-09-03 DIAGNOSIS — Z01811 Encounter for preprocedural respiratory examination: Secondary | ICD-10-CM

## 2010-09-03 DIAGNOSIS — I517 Cardiomegaly: Secondary | ICD-10-CM | POA: Insufficient documentation

## 2010-09-03 DIAGNOSIS — I10 Essential (primary) hypertension: Secondary | ICD-10-CM | POA: Insufficient documentation

## 2010-09-03 LAB — CBC
HCT: 42.7 % (ref 36.0–46.0)
Hemoglobin: 14.2 g/dL (ref 12.0–15.0)
MCH: 30.4 pg (ref 26.0–34.0)
MCHC: 33.3 g/dL (ref 30.0–36.0)
MCV: 91.4 fL (ref 78.0–100.0)
Platelets: 367 10*3/uL (ref 150–400)
RBC: 4.67 MIL/uL (ref 3.87–5.11)
RDW: 14.4 % (ref 11.5–15.5)
WBC: 11.6 10*3/uL — ABNORMAL HIGH (ref 4.0–10.5)

## 2010-09-03 LAB — BASIC METABOLIC PANEL
BUN: 20 mg/dL (ref 6–23)
CO2: 26 mEq/L (ref 19–32)
Calcium: 10.6 mg/dL — ABNORMAL HIGH (ref 8.4–10.5)
Chloride: 101 mEq/L (ref 96–112)
Creatinine, Ser: 0.94 mg/dL (ref 0.4–1.2)
GFR calc Af Amer: >60 mL/min (ref >60)
GFR calc non Af Amer: 60 mL/min — ABNORMAL LOW (ref >60)
Glucose, Bld: 101 mg/dL — ABNORMAL HIGH (ref 70–99)
Potassium: 4.4 mEq/L (ref 3.5–5.1)
Sodium: 136 mEq/L (ref 135–145)

## 2010-09-03 LAB — ABO/RH: ABO/RH(D): O POS

## 2010-09-03 LAB — APTT: aPTT: 26 seconds (ref 24–37)

## 2010-09-03 LAB — TYPE AND SCREEN
ABO/RH(D): O POS
Antibody Screen: NEGATIVE

## 2010-09-03 LAB — SURGICAL PCR SCREEN
MRSA, PCR: POSITIVE — AB
Staphylococcus aureus: POSITIVE — AB

## 2010-09-03 LAB — PROTIME-INR
INR: 0.9 (ref 0.00–1.49)
Prothrombin Time: 12.4 seconds (ref 11.6–15.2)

## 2010-09-09 ENCOUNTER — Other Ambulatory Visit (HOSPITAL_COMMUNITY): Payer: Medicare Other

## 2010-09-09 ENCOUNTER — Inpatient Hospital Stay (HOSPITAL_COMMUNITY): Payer: BC Managed Care – PPO

## 2010-09-09 ENCOUNTER — Inpatient Hospital Stay (HOSPITAL_COMMUNITY)
Admission: RE | Admit: 2010-09-09 | Discharge: 2010-09-15 | DRG: 755 | Disposition: A | Discharge status: Home Health | Payer: BC Managed Care – PPO | Source: Ambulatory Visit | Attending: Neurological Surgery | Admitting: Neurological Surgery

## 2010-09-09 DIAGNOSIS — M069 Rheumatoid arthritis, unspecified: Secondary | ICD-10-CM | POA: Diagnosis present

## 2010-09-09 DIAGNOSIS — M47817 Spondylosis without myelopathy or radiculopathy, lumbosacral region: Principal | ICD-10-CM | POA: Diagnosis present

## 2010-09-09 DIAGNOSIS — E876 Hypokalemia: Secondary | ICD-10-CM | POA: Diagnosis not present

## 2010-09-09 DIAGNOSIS — K219 Gastro-esophageal reflux disease without esophagitis: Secondary | ICD-10-CM | POA: Diagnosis present

## 2010-09-09 DIAGNOSIS — I10 Essential (primary) hypertension: Secondary | ICD-10-CM | POA: Diagnosis present

## 2010-09-09 DIAGNOSIS — Z79899 Other long term (current) drug therapy: Secondary | ICD-10-CM

## 2010-09-09 DIAGNOSIS — D62 Acute posthemorrhagic anemia: Secondary | ICD-10-CM | POA: Diagnosis not present

## 2010-09-09 DIAGNOSIS — J45909 Unspecified asthma, uncomplicated: Secondary | ICD-10-CM | POA: Diagnosis present

## 2010-09-09 DIAGNOSIS — M5126 Other intervertebral disc displacement, lumbar region: Secondary | ICD-10-CM | POA: Diagnosis present

## 2010-09-09 DIAGNOSIS — Q762 Congenital spondylolisthesis: Secondary | ICD-10-CM

## 2010-09-10 LAB — BASIC METABOLIC PANEL
BUN: 14 mg/dL (ref 6–23)
CO2: 26 mEq/L (ref 19–32)
Calcium: 8 mg/dL — ABNORMAL LOW (ref 8.4–10.5)
Chloride: 104 mEq/L (ref 96–112)
Creatinine, Ser: 0.9 mg/dL (ref 0.4–1.2)
GFR calc Af Amer: >60 mL/min (ref >60)
GFR calc non Af Amer: >60 mL/min (ref >60)
Glucose, Bld: 123 mg/dL — ABNORMAL HIGH (ref 70–99)
Potassium: 3.6 mEq/L (ref 3.5–5.1)
Sodium: 136 mEq/L (ref 135–145)

## 2010-09-10 LAB — CBC
HCT: 26 % — ABNORMAL LOW (ref 36.0–46.0)
Hemoglobin: 8.7 g/dL — ABNORMAL LOW (ref 12.0–15.0)
MCH: 30.3 pg (ref 26.0–34.0)
MCHC: 33.5 g/dL (ref 30.0–36.0)
MCV: 90.6 fL (ref 78.0–100.0)
Platelets: 266 10*3/uL (ref 150–400)
RBC: 2.87 MIL/uL — ABNORMAL LOW (ref 3.87–5.11)
RDW: 14 % (ref 11.5–15.5)
WBC: 10.1 10*3/uL (ref 4.0–10.5)

## 2010-09-11 LAB — CBC
HCT: 19.6 % — ABNORMAL LOW (ref 36.0–46.0)
Hemoglobin: 6.5 g/dL — CL (ref 12.0–15.0)
MCH: 29.7 pg (ref 26.0–34.0)
MCHC: 33.2 g/dL (ref 30.0–36.0)
MCV: 89.5 fL (ref 78.0–100.0)
Platelets: 221 10*3/uL (ref 150–400)
RBC: 2.19 MIL/uL — ABNORMAL LOW (ref 3.87–5.11)
RDW: 13.9 % (ref 11.5–15.5)
WBC: 6.6 10*3/uL (ref 4.0–10.5)

## 2010-09-11 LAB — BASIC METABOLIC PANEL
BUN: 10 mg/dL (ref 6–23)
CO2: 30 mEq/L (ref 19–32)
Calcium: 7.6 mg/dL — ABNORMAL LOW (ref 8.4–10.5)
Chloride: 101 mEq/L (ref 96–112)
Creatinine, Ser: 0.94 mg/dL (ref 0.4–1.2)
GFR calc Af Amer: >60 mL/min (ref >60)
GFR calc non Af Amer: 60 mL/min — ABNORMAL LOW (ref >60)
Glucose, Bld: 127 mg/dL — ABNORMAL HIGH (ref 70–99)
Potassium: 3 mEq/L — ABNORMAL LOW (ref 3.5–5.1)
Sodium: 135 mEq/L (ref 135–145)

## 2010-09-12 LAB — CBC
HCT: 24.2 % — ABNORMAL LOW (ref 36.0–46.0)
Hemoglobin: 8.2 g/dL — ABNORMAL LOW (ref 12.0–15.0)
MCH: 29.5 pg (ref 26.0–34.0)
MCHC: 33.9 g/dL (ref 30.0–36.0)
MCV: 87.1 fL (ref 78.0–100.0)
Platelets: 221 10*3/uL (ref 150–400)
RBC: 2.78 MIL/uL — ABNORMAL LOW (ref 3.87–5.11)
RDW: 15.7 % — ABNORMAL HIGH (ref 11.5–15.5)
WBC: 6.6 10*3/uL (ref 4.0–10.5)

## 2010-09-12 LAB — BASIC METABOLIC PANEL
BUN: 4 mg/dL — ABNORMAL LOW (ref 6–23)
CO2: 28 mEq/L (ref 19–32)
Calcium: 7.8 mg/dL — ABNORMAL LOW (ref 8.4–10.5)
Chloride: 102 mEq/L (ref 96–112)
Creatinine, Ser: 0.77 mg/dL (ref 0.4–1.2)
GFR calc Af Amer: >60 mL/min (ref >60)
GFR calc non Af Amer: >60 mL/min (ref >60)
Glucose, Bld: 106 mg/dL — ABNORMAL HIGH (ref 70–99)
Potassium: 3.3 mEq/L — ABNORMAL LOW (ref 3.5–5.1)
Sodium: 134 mEq/L — ABNORMAL LOW (ref 135–145)

## 2010-09-12 LAB — CROSSMATCH
ABO/RH(D): O POS
Antibody Screen: NEGATIVE
Unit division: 0
Unit division: 0

## 2010-09-12 LAB — GLUCOSE, CAPILLARY: Glucose-Capillary: 156 mg/dL — ABNORMAL HIGH (ref 70–99)

## 2010-09-13 LAB — CBC
HCT: 28.8 % — ABNORMAL LOW (ref 36.0–46.0)
Hemoglobin: 9.6 g/dL — ABNORMAL LOW (ref 12.0–15.0)
MCH: 29.7 pg (ref 26.0–34.0)
MCHC: 33.3 g/dL (ref 30.0–36.0)
MCV: 89.2 fL (ref 78.0–100.0)
Platelets: 327 10*3/uL (ref 150–400)
RBC: 3.23 MIL/uL — ABNORMAL LOW (ref 3.87–5.11)
RDW: 15.2 % (ref 11.5–15.5)
WBC: 6.5 10*3/uL (ref 4.0–10.5)

## 2010-09-13 LAB — BASIC METABOLIC PANEL
BUN: 9 mg/dL (ref 6–23)
CO2: 30 mEq/L (ref 19–32)
Calcium: 9.1 mg/dL (ref 8.4–10.5)
Chloride: 100 mEq/L (ref 96–112)
Creatinine, Ser: 0.83 mg/dL (ref 0.4–1.2)
GFR calc Af Amer: >60 mL/min (ref >60)
GFR calc non Af Amer: >60 mL/min (ref >60)
Glucose, Bld: 127 mg/dL — ABNORMAL HIGH (ref 70–99)
Potassium: 3.2 mEq/L — ABNORMAL LOW (ref 3.5–5.1)
Sodium: 136 mEq/L (ref 135–145)

## 2010-09-14 ENCOUNTER — Inpatient Hospital Stay (HOSPITAL_COMMUNITY): Payer: BC Managed Care – PPO

## 2010-09-14 LAB — CBC
HCT: 29.8 % — ABNORMAL LOW (ref 36.0–46.0)
Hemoglobin: 9.8 g/dL — ABNORMAL LOW (ref 12.0–15.0)
MCH: 29.3 pg (ref 26.0–34.0)
MCHC: 32.9 g/dL (ref 30.0–36.0)
MCV: 89.2 fL (ref 78.0–100.0)
Platelets: 401 10*3/uL — ABNORMAL HIGH (ref 150–400)
RBC: 3.34 MIL/uL — ABNORMAL LOW (ref 3.87–5.11)
RDW: 14.7 % (ref 11.5–15.5)
WBC: 8.6 10*3/uL (ref 4.0–10.5)

## 2010-09-14 LAB — BASIC METABOLIC PANEL
BUN: 12 mg/dL (ref 6–23)
CO2: 29 mEq/L (ref 19–32)
Calcium: 9.5 mg/dL (ref 8.4–10.5)
Chloride: 100 mEq/L (ref 96–112)
Creatinine, Ser: 0.79 mg/dL (ref 0.4–1.2)
GFR calc Af Amer: >60 mL/min (ref >60)
GFR calc non Af Amer: >60 mL/min (ref >60)
Glucose, Bld: 101 mg/dL — ABNORMAL HIGH (ref 70–99)
Potassium: 4.2 mEq/L (ref 3.5–5.1)
Sodium: 138 mEq/L (ref 135–145)

## 2010-09-14 LAB — CLOSTRIDIUM DIFFICILE BY PCR: Toxigenic C. Difficile by PCR: NEGATIVE

## 2010-09-28 NOTE — Op Note (Signed)
Hailey, [name] NO.:  [id number]  MEDICAL RECORD NO.:  [id number]           PATIENT TYPE:  I  LOCATION:  3108                         FACILITY:  [facility]  PHYSICIAN:  Earleen Newport, M.D.  DATE OF BIRTH:  1945-09-15  DATE OF PROCEDURE:  09/09/2010 DATE OF DISCHARGE:                              OPERATIVE REPORT   PREOPERATIVE DIAGNOSES: 1. Lumbar spondylosis and stenosis with spondylolisthesis, L4-L5. 2. Herniated nucleus pulposus, L3-L4 and L4-L5. 3. Lumbar radiculopathy, bilateral L4 and L5.  POSTOPERATIVE DIAGNOSES: 1. Lumbar spondylosis and stenosis with spondylolisthesis, L4-L5. 2. Herniated nucleus pulposus, L3-L4 and L4-L5. 3. Lumbar radiculopathy, bilateral L4 and L5.  PROCEDURES: 1. Lumbar laminectomy, L2, L3 and L4. 2. Decompression of L2, L3, L4, and L5 nerve roots with exploration     greater then required for simple posterior lumbar interbody     arthrodesis using PEEK spacer, local autograft and allograft, L2-     L3, L3-L4, L4-L5. 3. Segmental fixation, L2-L5 with pedicle screws. 4. Posterolateral arthrodesis with local autograft and allograft, L2-     L5.  SURGEON:  Earleen Newport, MD  FIRST ASSISTANT:  Ophelia Charter, MD  ANESTHESIA:  General endotracheal.  INDICATIONS:  [name] is a 65 year old individual who has had significant problems with back and bilateral lower extremity pain including weakness.  She has an advanced spondylolisthesis at L4-L5 with a large herniated disk and severe central and lateral recess stenosis at the L4-L5 level.  She also has a disk herniation behind the body of L3 and disk degeneration and disk herniation at L3-L4.  She has severe lumbar radiculopathy with the degenerative changes and stenoses.  She is deteriorating her ability to function independently including her gait. She has been advised regarding surgical decompression and stabilization from L2-L5.  PROCEDURE IN DETAIL:  The  patient was brought to the operating room supine on the stretcher.  After smooth induction of general endotracheal anesthesia, she was turned prone.  Back was prepped with DuraPrep and alcohol and draped in a sterile fashion.  Midline incision was created and carried down to the lumbodorsal fascia which was opened on either side of midline at L2, L3, L4 and the top of L5.  The spinous processes of L3-L4 were identified positively on a radiograph and the dissection was then carried out over the facet, the transverse processes from L2 down to L5.  These were decorticated and packed away for later use for bone grafting.  A laminectomy was then created removing the entire laminar arch and spinous process of L3-L4, dural tube was exposed and this was carefully dissected and decompressed.  The path was carried out to the lateral recesses where the nerve roots were individually decompressed using combination of a high-speed drill and 2-3-mm Kerrison punches.  This allowed decompression of the L5 nerve root inferiorly, L4, L3, and L2 above it.  These were each done sequentially and independently.  Once this was performed, the disk spaces were also isolated at this point and then the disk was incised with a #15 blade at L4-L5 and then a diskectomy was performed.  This was a total diskectomy at each level, L4-L5, L3-L4, and L2-L3 to decompress the disk space and allowing placement of PLIF bone spacers.  We also removed a large herniated fragment of disk on the right side at L4-L5 and also behind the vertebral body on the left side at L3-L4.  This allowed for decompression of the L3 nerve root and the L4 nerve root.  Once the diskectomies were completed, the endplates were decorticated and sizes were placed.  At L4-L5, 10-mm spacers were fit into the interspace. This reduced the spondylolisthesis a considerable amount at L4-L5.  This also allowed for good egress of the L5 nerve roots.  The interspace  was packed with the remaining autograft and also some pledgets of Gelfoam soaked in PureGen.  At L3-L4, 10-mm spacers were placed in a similar fashion with similar bone graft.  At L2-L3, we placed 8-mm spacers.  At this point, pedicle entry sites were chosen at L2, L3, L4, and L5 and 6.5 x 50 mm screws were placed in L2-L3, 6.5 x 45 mm screws were placed in L4, and 6.5 x 45 mm screws were placed in L5.  Each hole was sounded, tapped, sounded again and then the screw was placed under fluoroscopic guidance.  No cutout was observed.  The screw tower was then connected with a 110-mm rod on the right side and 120-mm rod on the left side. Once these were set and torqued down, final position of the transverse connector was applied.  Then the posterolateral gutters were packed with the remainder of autograft bone that was harvested from the lamina and facet joints.  A 10 mL strip of Vitoss was placed on the left lateral gutter from L2-L5 to facilitate arthrodesis here.  Hemostasis in the wound was checked carefully.  Nerve roots were each sounded out from L2 down to L5 on either side to make sure that they were well decompressed with no debris encumbering and once this was verified then the lumbodorsal fascia was closed with #1 Vicryl in interrupted fashion, 2-0 Vicryl was used in subcutaneous tissues, and 3-0 Vicryl subcuticularly. Dermabond was placed on the skin.  The patient tolerated the procedure well.     Earleen Newport, M.D.     Drucilla Schmidt  D:  09/09/2010  T:  09/10/2010  Job:  [id number]  Electronically Signed by Kristeen Miss M.D. on 09/28/2010 09:29:42 AM

## 2010-11-01 ENCOUNTER — Other Ambulatory Visit: Payer: Self-pay | Admitting: Internal Medicine

## 2010-11-01 ENCOUNTER — Ambulatory Visit (INDEPENDENT_AMBULATORY_CARE_PROVIDER_SITE_OTHER): Payer: BC Managed Care – PPO

## 2010-11-01 DIAGNOSIS — J309 Allergic rhinitis, unspecified: Secondary | ICD-10-CM

## 2010-11-01 NOTE — Telephone Encounter (Signed)
Please advise if okay to send refill

## 2010-11-07 ENCOUNTER — Emergency Department (HOSPITAL_COMMUNITY): Payer: BC Managed Care – PPO

## 2010-11-07 ENCOUNTER — Emergency Department (HOSPITAL_COMMUNITY)
Admission: EM | Admit: 2010-11-07 | Discharge: 2010-11-07 | Disposition: A | Discharge status: Home / Self Care | Payer: BC Managed Care – PPO | Attending: Emergency Medicine | Admitting: Emergency Medicine

## 2010-11-07 DIAGNOSIS — K0889 Other specified disorders of teeth and supporting structures: Secondary | ICD-10-CM | POA: Insufficient documentation

## 2010-11-07 DIAGNOSIS — I10 Essential (primary) hypertension: Secondary | ICD-10-CM | POA: Insufficient documentation

## 2010-11-07 DIAGNOSIS — W010XXA Fall on same level from slipping, tripping and stumbling without subsequent striking against object, initial encounter: Secondary | ICD-10-CM | POA: Insufficient documentation

## 2010-11-07 DIAGNOSIS — M542 Cervicalgia: Secondary | ICD-10-CM | POA: Insufficient documentation

## 2010-11-07 DIAGNOSIS — Z79899 Other long term (current) drug therapy: Secondary | ICD-10-CM | POA: Insufficient documentation

## 2010-11-07 DIAGNOSIS — M25569 Pain in unspecified knee: Secondary | ICD-10-CM | POA: Insufficient documentation

## 2010-11-07 DIAGNOSIS — R220 Localized swelling, mass and lump, head: Secondary | ICD-10-CM | POA: Insufficient documentation

## 2010-11-07 DIAGNOSIS — S60229A Contusion of unspecified hand, initial encounter: Secondary | ICD-10-CM | POA: Insufficient documentation

## 2010-11-07 DIAGNOSIS — R221 Localized swelling, mass and lump, neck: Secondary | ICD-10-CM | POA: Insufficient documentation

## 2010-11-07 DIAGNOSIS — M545 Low back pain, unspecified: Secondary | ICD-10-CM | POA: Insufficient documentation

## 2010-11-07 DIAGNOSIS — S139XXA Sprain of joints and ligaments of unspecified parts of neck, initial encounter: Secondary | ICD-10-CM | POA: Insufficient documentation

## 2010-11-07 DIAGNOSIS — IMO0002 Reserved for concepts with insufficient information to code with codable children: Secondary | ICD-10-CM | POA: Insufficient documentation

## 2010-11-07 DIAGNOSIS — Y9229 Other specified public building as the place of occurrence of the external cause: Secondary | ICD-10-CM | POA: Insufficient documentation

## 2010-11-07 DIAGNOSIS — S8000XA Contusion of unspecified knee, initial encounter: Secondary | ICD-10-CM | POA: Insufficient documentation

## 2010-11-07 DIAGNOSIS — E7800 Pure hypercholesterolemia, unspecified: Secondary | ICD-10-CM | POA: Insufficient documentation

## 2010-11-30 NOTE — Op Note (Signed)
NAMESIANA, PANAMENO NO.:  [id number]   MEDICAL RECORD NO.:  [id number]          PATIENT TYPE:  AMB   LOCATION:  [place]                          FACILITY:  [facility]   PHYSICIAN:  Weber Cooks, M.D.     DATE OF BIRTH:  10/31/1945   DATE OF PROCEDURE:  02/07/2007  DATE OF DISCHARGE:                               OPERATIVE REPORT   PREOPERATIVE DIAGNOSIS:  1. Left rheumatoid hindfoot arthritis.  2. Left tight Achilles tendon.   POSTOPERATIVE DIAGNOSIS:  1. Left rheumatoid hindfoot arthritis.  2. Left tight Achilles tendon.   OPERATION:  1. Left triple arthrodesis.  2. Left iliac crest bone graft.  3. Left percutaneous tendoAchilles lengthening.  4. Stress x-rays of left foot.   ANESTHESIA:  General with popliteal block.   SURGEON:  Weber Cooks, M.D.   ASSISTANT:  Lockie Mola, P.A.-C.   ESTIMATED BLOOD LOSS:  Minimal.   COMPLICATIONS:  None.   TOURNIQUET TIME:  2 hours.   DISPOSITION:  Stable to the PR.   INDICATIONS:  This is a 65 year old female who has had long standing  left hindfoot pain that was interfering with her life where she could  not do what she wanted to do.  She is consented for the above procedure.  All risks including infection, vessel injury, nonunion, malunion,  hardware rotation, hardware failure, persistent or worsening pain,  prolonged recovery, stiffness, arthritis of juxta-articular joints, were  all explained, questions were encouraged.   OPERATIVE PROCEDURE:  The patient was brought to the operating room and  placed in the supine position. Initially, after adequate general  endotracheal anesthesia was administered with popliteal block as well as  Ancef 1 gram IV piggyback, the patient was then placed with the left  lower extremity internally rotated on a beanbag.  All bony prominences  were well padded.  The left lower extremity as well as the left iliac  crest bone graft site were prepped and draped in a sterile manner  over a  proximally thigh tourniquet.  The limb was gravity exsanguinated and the  tourniquet elevated to 290 mmHg.   A longitudinal incision over the left iliac crest bone graft site was  then made.  Dissection was carried down through skin.  Hemostasis was  obtained.  The interval between the inner and outer table of the iliac  crest graft was then developed.  A trap door was then created. Using a  sagittal saw, drill holes were placed in this piece for later fixation.  Bone was then scooped out with a curet and placed on the back table for  later graft.  The wound was copiously irrigated with normal saline.  The  trap door was then reapplied and fixed with #2 FiberWire stitch and  Marcaine without epinephrine was then applied to the hip.  We let this  soak in this area and then later closed this.   We then made an Ollier incision over the sinus tarsi area.  Dissection  was carried down through skin.  Hemostasis was obtained.  The extensor  digitorum brevis muscle was then  removed from its origin and retracted  out of harm's way.  The subtalar joint, lateral aspect of the  talonavicular joint, and the CC joint were then entered. The remaining  cartilage was removed with a curet, curved 1/4 inch osteotome, and a  synovectomy rongeur.  Once all the cartilage was removed, multiple 2 mm  drill holes were placed on either side of these joints respectively as  well as fish scaling with a curved 1/4 inch osteotome.  This was done  with the aid of a small lamina spreader.  After this was done, a  percutaneous tendoAchilles lengthening was performed with two medial and  lateral hemisections of the Achilles tendon to release the tight  Achilles tendon.   We then made a longitudinal incision over the dorsal aspect medially  over the post tibial tendon.  Dissection was carried down through skin.  Hemostasis was obtained. Post tibial tendon and anterior tibialis tendon  were then protected and  the talonavicular joint was then entered. The  remaining cartilage in this area was also removed and multiple to 2 mm  drill holes and fish scaling was performed on either side of the joint,  as well, to prepare the joint.  We then placed a lamina spreader in the  sinus tarsi reducing the subtalar joint and then placed a 2 mm wide K-  wire to provisionally fix the subtalar joint.  We made another incision  in the plantar aspect of the heel midline and placed a 6.5 mm 60 mm  threaded cancellous screw using 3.5 and 2.5 mm drill holes,  respectively.  This had an excellent purchase and compression across the  arthrodesis site. Prior to doing this, the iliac crest graft cancellous  bone was applied into the subtalar joint area.   We then reduced the talonavicular joint by plantar flexing the first  ray.  This was done and provisionally fixed with a 2-0 K-wire.  We then  placed a 6.5 mm 32 mm threaded cancellous screw using 4.5 and 3.2 -mm  drill holes respectively.  This had excellent purchasing and compression  across the talonavicular joint.  The K-wire was removed.  Prior to  placing the screw, again graft was applied to the talonavicular joint.  We then applied graft to the CC joint and placed a 6.5 mm 16 mm threaded  cancellus screw using 4.5 and 3.2 mm drill holes respectively.  Again,  this had excellent purchase and maintenance of the correction and  compression across the fusion site.   Stress x-rays were obtained in AP ankle, AP foot, and lateral foot and  showed no gross motion across the fusion site, fixation and proposition  and excellent alignment, as well.  The remaining graft was applied to  the stress strain relieving graft using a bur in the subtalar joint,  talonavicular joint, and CC joints, respectively.  The tourniquet was  deflated.  Hemostasis was obtained.  The EDB was closed with 2-0 Vicryl,  the subcu was closed with 3-0 Vicryl.  Over the iliac crest graft site,   the deep tissues were closed with 2-0 Vicryl, subcu was closed with 3-0  Vicryl, and the skin was closed with 4-0 Monocryl subcuticular stitch.  Steri-Strips were applied.  All skin incisions were closed with 4-0  nylon over the foot and ankle area.  ?A sterile dressing was applied.  A  modified Jones dressing was applied with the ankle in neutral  dorsiflexion.  The patient was stable to  the PR.     Weber Cooks, M.D.  Electronically Signed    PB/MEDQ  D:  02/07/2007  T:  02/07/2007  Job:  [id number]

## 2010-11-30 NOTE — Assessment & Plan Note (Signed)
[name]                             PULMONARY OFFICE NOTE   Hailey, [name]                       MRN:          [id number]  DATE:11/30/2006                            DOB:          1946-06-17    PROBLEM:  1. Allergic rhinitis.  2. Asthmatic bronchitis.  3. Esophageal reflux.  4. Rheumatoid arthritis/chronic prednisone.   HISTORY:  She is currently off of prednisone and complaining of  generalized stiffness and myalgias.  We discussed steroid withdrawal.  Her mouth feels fine after fighting thrush all winter.  Sinuses feel  okay.  She continues on Claritin and Allegra-D.  No wheezing.  She very  much likes Symbicort 80/4.5 with AeroChamber and is feeling clear in her  chest.   MEDICATIONS:  1. Flonase.  2. Allegra-D.  3. Singulair.  4. Lipitor 10 mg.  5. Verapamil 240 mg.  6. Librax.  7. Xanax.  8. Protonix.  9. Gemfibrozil.  10.Vitamin D.  11.Protonix.  12.Symbicort 80/4.5.  13.Albuterol rescue inhaler.  14.Robinul.  15.Plaquenil.  16.Tussionex.   She has an EpiPen as she continues allergy vaccine with no problems at  1:10.  Mouth culture yielded candida species consistent with a diagnosis  of thrush.  I had asked for sensitivity identification if available  because her problem persisted so long, but got no further clarifying  information from the lab.   OBJECTIVE:  Weight 163 pounds, blood pressure 122/70, pulse 93, room air  saturation 95%.  Tongue is still coated.  Voice quality is normal.  She  has lost some weight.  HEART:  Sounds are regular without murmur.  I hear no wheeze or cough.   IMPRESSION:  1. It may be mild residual thrush on Symbicort.  2. Arthralgias seem consistent with steroid withdrawal and she knows      to follow up adrenal insufficiency issue with Dr. Charlestine Night.  3. Asthma and rhinitis are currently doing quite well.   PLAN:  She has a little leftover magic mouthwash and will finish that.  She  may try skipping Claritin, just using Allegra-D.  Continue Symbicort  80/4.5.  Schedule return 2 months, earlier p.r.n.     Clinton D. Annamaria Boots, MD, [place]ade Flood, FACP  Electronically Signed    CDY/MedQ  DD: 12/02/2006  DT: 12/02/2006  Job #: [id number]   cc:   Claris Gower, M.D.  Daleen Bo Gaetano Net, M.D.  Lindaann Slough, M.D.

## 2010-11-30 NOTE — Assessment & Plan Note (Signed)
[facility]                             PULMONARY OFFICE NOTE   [name]                       MRN:          [id number]  DATE:05/10/2007                            DOB:          Mar 18, 1946    PROBLEMS:  1. Allergic rhinitis.  2. Asthmatic bronchitis.  3. Esophageal reflux.  4. Rheumatoid arthritis/chronic prednisone.   HISTORY:  She has been off of prednisone quite a while now. She had  arthritis repair surgery left ankle. Some fall pollen rhinitis and took  extra Claritin then. No lower respiratory discomfort and breathing  overall is doing quite well now. She continued allergy vaccine at 1 to  10 with no problems. She had pneumococcal vaccine twice. Had flu vaccine  already this year.   MEDICATIONS:  1. Amitriptyline 75 mg nightly.  2. Singulair 10 mg.  3. Allegra D 12 hour b.i.d. p.r.n.  4. Verapamil 240 mg.  5. Lipitor 10 mg x0.5  6. Potassium 20 mEq.  7. Triamterene/hydrochlorothiazide 75/50, 3 daily.  8. Hydroxychloroquine 200 mg, taking 3 of them b.i.d.  9. Clidinium t.i.d.  10.Gemfibrozil 600 mg b.i.d.  11.Protonix 40 mg.  12.Flonase 2 sprays each nostril.  13.Vitamin D.  14.Multivitamins.  15.Symbicort 80/4.5 2 puffs b.i.d.  16.Fosamax 70 mg.  17.Celebrex 200 mg b.i.d.  18.Albuterol rescue inhaler p.r.n.  19.Vicodin p.r.n.   Drug intolerant ERYTHROMYCIN with diarrhea.   OBJECTIVE:  Weight 152 pounds wearing a walking boot. Blood pressure  102/66, pulse 88, room air saturation 97%. Chest sounds clear. Nasal  airway is clear. Heart sounds are regular without murmur. There is no  peripheral edema in the exposed ankle.   IMPRESSION:  Asthma and rhinitis are currently well controlled and I am  very pleased that she is able to be off of prednisone.   PLAN:  We discussed prophylaxis against viral infections this winter. I  refilled her Allegra D 12 hour for p.r.n. use. Schedule return 4 months,  earlier  p.r.n.     Clinton D. Annamaria Boots, MD, Shade Flood, [place]  Electronically Signed    CDY/MedQ  DD: 05/11/2007  DT: 05/12/2007  Job #: [id number]   cc:   [name], M.D.  Weber Cooks, M.D.

## 2010-12-03 NOTE — Discharge Summary (Signed)
[name], KARABIN NO.:  [id number]   MEDICAL RECORD NO.:  [id number]          PATIENT TYPE:  INP   LOCATION:  9319                          FACILITY:  [facility]   PHYSICIAN:  Daleen Bo. Gaetano Net, M.D. DATE OF BIRTH:  12/11/1945   DATE OF ADMISSION:  02/27/2006  DATE OF DISCHARGE:  03/01/2006                                 DISCHARGE SUMMARY   ADMITTING DIAGNOSES:  1. Uterine leiomyomata.  2. Postmenopausal bleeding.  3. Pelvic relaxation.   DISCHARGE DIAGNOSES:  1. Uterine leiomyomata.  2. Postmenopausal bleeding.  3. Pelvic relaxation.   PROCEDURE:  February 27, 2006, laparoscopically assisted vaginal hysterectomy  with bilateral salpingo-oophorectomy, anterior vaginal repair of grafts and  posterior vaginal repair of grafts and posterior vaginal repair of grafts.   REASON FOR ADMISSION:  The patient is a 65 year old married white female,  G2, P2 with recurrent post menopausal bleeding and symptomatic pelvic  relaxation and uterine leiomyomata.  Details are dictated in the history and  physical.  She is admitted for surgical management.   HOSPITAL COURSE:  The patient was admitted to the hospital and undergoes the  above procedure.  On the evening of surgery, she has good pain control.  No  nausea or vomiting, and she is tolerating a regular diet.  She has been up  on her feet but has not yet passed flatus.  Vital signs are stable.  She is  afebrile with clear urine output.  Abdomen is soft with positive bowel  sounds.  On the first postoperative day, she is passing flatus and  tolerating a regular diet.  She is ambulating but has not yet voided.  Vital  signs are stable and she is afebrile.  Hemoglobin is 9.3, white count 11.1,  platelets 304,000.  Potassium is 3.2 and pathology is pending.  The patient  is given 40 mEq of K-Dur orally.  The following day, she is tolerating a  regular diet, voiding and passing flatus.  She remains afebrile with stable  vital  signs.  Potassium is 3.4.   DISCHARGE CONDITION:  Good.   DIET:  Regular as tolerated.   ACTIVITY:  No lifting.  No operation of automobiles.  No vaginal entry.  She  is instructed in double voiding.  She is to call the office with problems  including but not limited to temperature of 101 degrees, persistent nausea  and vomiting, heavy bleeding or increasing pain.   DISCHARGE MEDICATIONS:  1. Percocet 5/325 mg, #40 one to two p.o. q.6 h. P.r.n.  2. Chromogen #30 one p.o. every day, one refill.  3. Ibuprofen 600 mg q.6 h. P.r.n.  4. She is to resume her other preoperative medications.  5. Follow up is in the office in one week.      Daleen Bo Gaetano Net, M.D.  Electronically Signed     JET/MEDQ  D:  03/01/2006  T:  03/01/2006  Job:  [id number]

## 2010-12-03 NOTE — Op Note (Signed)
NAMEABAGAEL, KRAMM NO.:  [id number]   MEDICAL RECORD NO.:  [id number]          PATIENT TYPE:  INP   LOCATION:  0159                         FACILITY:  [facility]   PHYSICIAN:  Imogene Burn. Georgette Dover, M.D. DATE OF BIRTH:  May 07, 1946   DATE OF PROCEDURE:  07/01/2005  DATE OF DISCHARGE:                                 OPERATIVE REPORT   PREOPERATIVE DIAGNOSIS:  Postcholecystectomy bile leak.   POSTOPERATIVE DIAGNOSIS:  Postcholecystectomy bile leak.   PROCEDURE PERFORMED:  Diagnostic laparoscopy with drain placement.   SURGEON:  Dr. Georgette Dover   ANESTHESIA:  General endotracheal.   SPECIMENS:  None.   INDICATIONS:  The patient is a 64 year old female with a number of medical  problems, who presented with epigastric pain.  She was thoroughly evaluated  by Dr. Watt Climes of gastroenterology.  She had an ultrasound which showed  gallstones but no evidence of cholecystitis.  She underwent a laparoscopic  cholecystectomy on June 28, 2005, which was uncomplicated.  She had an  intraoperative cholangiogram which showed normal anatomy.  Three clips were  placed on the cystic duct.  The patient was actually discharged home the  same day and was doing quite well.  Last night, she went out to [facility] for  some food.  Shortly thereafter, she began having severe right upper quadrant  pain which progressed to the point where she was brought to the emergency  department.  She was noted to have an elevated white count of 15.4.  She  underwent a HIDA scan which showed the possibility of a bile leak.  She then  underwent a CT scan which showed a large amount of fluid over the liver.  She was readmitted to the hospital and brought to the operating room for  diagnostic laparoscopy.   DESCRIPTION OF PROCEDURE:  The patient is brought to the operating room,  placed in supine position on the operating room table.  After an adequate  level of general anesthesia was obtained, a Foley catheter was  placed under  sterile technique.  The patient's abdomen was then prepped with Betadine and  draped in a sterile fashion.  A time-out was taken to ensure the proper  patient, proper procedure.  The previous umbilical incision was opened.  The  fascial suture was removed.  A new stay suture of 0 Vicryl was placed in the  fascia and used to secure a Hasson cannula.  Pneumoperitoneum was obtained  by insufflating CO2, maintaining maximum pressure of 15 mmHg.  The other  port sites were all opened and reinserted.  There was some inflammation in  the right upper quadrant over the liver.  This was taken down bluntly.  Some  bile-stained fluid was encountered.  This was suctioned out.  The liver  retractor was inserted through a lateral 5 mm port and used to elevate the  liver.  We carefully inspected the gallbladder bed.  No bleeding was noted.  There was bile staining in the area, but no obvious leak was noted.  I could  not visualize the previously placed clips due to inflammation.  I did not  feel safe to further dissect in this area.  Therefore, the decision was made  to thoroughly irrigate and place a drain.  A total of 4 L of irrigation was  used.  A #19 Blake drain was brought through the subxiphoid port and pulled  out through the most lateral right upper quadrant port.  This was placed in  the liver bed.  The port sites were then removed under direct vision, and  pneumoperitoneum was released.  The stay suture was used to close the  umbilical fascia.  Monocryl 4-0 was used to close the skin in subcuticular  fashion.  Steri-Strips and clean dressings were applied.  The patient was  brought to the recovery room intubated due to difficult intubation.  She was  in stable condition.      Imogene Burn. Tsuei, M.D.  Electronically Signed     MKT/MEDQ  D:  07/01/2005  T:  07/03/2005  Job:  [id number]   cc:   Jeryl Columbia, M.D.  Fax: [phone]

## 2010-12-03 NOTE — Assessment & Plan Note (Signed)
[facility]                             PULMONARY OFFICE NOTE   IRINE, HEMINGER                       MRN:          [id number]  DATE:08/22/2006                            DOB:          Nov 19, 1945    PULMONARY FOLLOWUP:   PROBLEM LIST:  1. Allergic rhinitis.  2. Asthmatic bronchitis.  3. Esophageal reflux.  4. Rheumatoid arthritis on chronic prednisone.   HISTORY:  She had thrush when she was here February 1 and had partly  cleared with Diflucan 150 mg x2.  She called back, and we started  Nystatin oral suspension 5 mL 4 times a day.  She says her mouth is much  better, but she is coughing worse despite Tussionex and Tessalon.  She  had a CT of the sinuses this morning, and we do not have results yet.  She believes that when she gets like this, only a steroid shot and taper  will help, and we discussed this in relation to her other steroid issues  including most recently her thrust, but also bones, weight, and blood  sugar.   MEDICATIONS:  1. Advair 500/50.  2. Flonase.  3. Allegra D.  4. Singulair,  5. Prednisone 5 mg daily.  6. Lipitor 10 mg x 1/2.  7. Triamterene/hydrochlorothiazide 75/50.  8. Verapamil 240 mg.  9. Librax.  10.Xanax.  11.Protonix.  12.Esterase.  13.Prometrium.  14.Gemfibrozil.  15.Potassium.  16.Vitamin D.  17.Albuterol inhaler.  18.Plaquenil.  19.She has an Epipen.  20.She continues allergy vaccine at 1:10 with no problems, and she      does not relate any of her complaints to the timing of her allergy      vaccine injections.   DRUG INTOLERANCE:  ERYTHROMYCIN because of diarrhea.   OBSTETRICAL HISTORY:  VITAL SIGNS: Weight 183 pounds, blood pressure  124/80, pulse regular at 96.  Room air saturation 92%.  PULMONARY:  There is congested cough but not productive and with no  dullness or distinct wheeze.   IMPRESSION:  1. Asthmatic bronchitis with exacerbation.  2. Allergic rhinitis.   We continue to  watch for reflux.  A key issue, I think, is her chronic  immunosuppression, but we will try what she feels worked for her today.   PLAN:  1. Depo-Medrol 80 mg IM.  2. Prednisone 8-day taper from 40 mg back to 5 mg daily for      maintenance.  3. Nebulizer treatments Xopenex 0.25 mg.  4. Keep scheduled appointment.     Clinton D. Annamaria Boots, MD, [facility], [place]  Electronically Signed    CDY/MedQ  DD: 08/22/2006  DT: 08/22/2006  Job #: [id number]   cc:   Daleen Bo. Gaetano Net, M.D.  Lindaann Slough, M.D.  Claris Gower, M.D.

## 2010-12-03 NOTE — Op Note (Signed)
NAMEINETTE, DOUBRAVA NO.:  [id number]   MEDICAL RECORD NO.:  [id number]          PATIENT TYPE:  INP   LOCATION:  0159                         FACILITY:  [facility]   PHYSICIAN:  Jeryl Columbia, M.D.    DATE OF BIRTH:  [birth year]   DATE OF PROCEDURE:  07/02/2005  DATE OF DISCHARGE:                                 OPERATIVE REPORT   PROCEDURE:  ERCP, sphincterotomy and stent placement.   INDICATION:  Bile leak. Consent was signed after risks, benefits, methods,  and options were thoroughly discussed by Dr. Michail Sermon  and me prior to any  sedation.   MEDICINES USED:  Demerol 80, Versed 9.   PROCEDURE:  The side-viewing therapeutic video duodenoscope was inserted by  direct vision into the stomach. A little bit of old material was seen, some  fluid was suctioned and advanced through a normal antrum, normal pylorus,  into the duodenum and a small yet normal-appearing ampulla was brought into  view. Using the triple-lumen sphincterotome loaded with the Jag wire, we  were able to get selective CBD cannulation, no PD injections were seen. The  Jag wire was inserted into the intrahepatic and deep selective cannulation  was obtained. We did not overfill the duct system, did not obviously see the  point of the leak. She did have a tight periampullary segment and we elected  to proceed with a small sphincterotomy which was done in the customary  fashion over the Jag wire. We then went ahead and placed a 10-French 5 cm  stent in the customary fashion with adequate biliary and dye drainage. We  elected to stop the procedure at this junction. The scope was removed. The  patient tolerated the procedure well. There was no obvious immediate  complication.   ENDOSCOPIC DIAGNOSES:  1.  Small papilla.  2.  No pancreatic duct injections.  3.  Common bile duct with leak, difficult to say where without over      injecting.  4.  Not overfill the intrahepatic, not evaluated in therapy,  status post      small sphincterotomy in place of 10-French 5 cm stent with adequate      biliary drainage.   PLAN:  Observation only for now. Continue antibiotics. Dr. Michail Sermon to check  on tomorrow. Watch her drain output, symptoms and labs. Will follow with  you.           ______________________________  Jeryl Columbia, M.D.     MEM/MEDQ  D:  07/02/2005  T:  07/04/2005  Job:  [id number]   cc:   Imogene Burn. Georgette Dover, M.D.  [place] [place]

## 2010-12-03 NOTE — Assessment & Plan Note (Signed)
[facility]                             PULMONARY OFFICE NOTE   [name], [name]                       MRN:          [id number]  DATE:08/29/2006                            DOB:          07/12/1946    PROBLEMS:  1. Allergic rhinitis.  2. Asthmatic bronchitis.  3. Esophageal reflux.  4. Rheumatoid arthritis on chronic prednisone.   HISTORY:  Sore throat, malaise, ache, and cough, just never feels well.  She had developed thrush and took Diflucan and then Nystatin oral  suspension, which mostly cleared that. Green, purulent sputum, tussive  soreness left anterior costal margin.   MEDICATIONS:  List substantially unchanged. She is back on prednisone 5  mg daily.   OBJECTIVE:  Weight 181 pounds, blood pressure 122/78, pulse regular 96,  room air saturation 97%. Throat may be just a little reddened without  definite thrush now. No strider. She has a congested sounded cough. No  rhonchi or dullness. Heart sounds are regular without murmur. No edema.  Sinus CAT scan was done the morning of this visit and just returns now  in time for dictation showing acute sinusitis involving the maxillary  sinuses with no fluid seen in the mastoid processes.   IMPRESSION:  1. Bronchitis.  2. Rhinosinusitis.  3. Steroid dependent for arthritis.  4. Recent thrush.   PLAN:  1. Nebulizer Xopenex 1.25 mg.  2. Depo-Medrol 80 mg i.m. with steroid talk.  3. Mycelex tablets 10 mg daily for 7 days with 1 refill.  4. Chest x-ray.  5. CBC.  6. Avelox 400 mg daily for 7 days.  7. I will call to follow up and we made need to extend her Avelox      considerably longer. We will      get her to ENT.  8. Keep scheduled April appointment, earlier p.r.n.     Clinton D. Annamaria Boots, MD, [facility], [place]  Electronically Signed    CDY/MedQ  DD: 08/30/2006  DT: 08/31/2006  Job #: [id number]   cc:   Daleen Bo. Gaetano Net, M.D.  Lindaann Slough, M.D.  Claris Gower, M.D.

## 2010-12-03 NOTE — Op Note (Signed)
NAMEPATTY, LEITZKE NO.:  [id number]   MEDICAL RECORD NO.:  [id number]          PATIENT TYPE:  AMB   LOCATION:  DAY                          FACILITY:  [facility]   PHYSICIAN:  Imogene Burn. Georgette Dover, M.D. DATE OF BIRTH:  1946/04/23   DATE OF PROCEDURE:  06/28/2005  DATE OF DISCHARGE:                                 OPERATIVE REPORT   PREOPERATIVE DIAGNOSES:  Symptomatic cholelithiasis.   POSTOPERATIVE DIAGNOSES:  Symptomatic cholelithiasis.   PROCEDURE:  Laparoscopic cholecystectomy with intraoperative cholangiogram.   SURGEON:  Imogene Burn. Georgette Dover, M.D.   ASSISTANT:  Shellia Carwin, M.D.   INDICATIONS FOR PROCEDURE:  The patient is a 65 year old female who was  referred by Dr. Watt Climes with a history of episodic upper abdominal pain. She  has had a barium swallow which was normal. Ultrasound showed gallstones but  no evidence of cholecystitis. The patient is located in her epigastrium and  is not necessarily related to eating. Dr. Watt Climes referred her with the  discussion that we would consider removing her gallbladder to see if it  relieved her pain. The patient understands that this may not be the etiology  of her pain. However, she wishes to proceed with the operation.   DESCRIPTION OF PROCEDURE:  The patient was brought to the operating room and  placed in supine position on the operating room table. After an adequate  level of general endotracheal anesthesia was obtained, the patient's abdomen  was prepped with Betadine and draped in a sterile fashion. She had been  given preoperative antibiotics and preoperative intravenous steroids. A  timeout was taken to ensure the proper patient and proper procedure. The  area just below her umbilicus was infiltrated with 0.25% Marcaine. A  transverse incision was made. Dissection was carried down through a very  thin amount of subcutaneous tissues to the fascia. The fascia was opened  vertically. Blunt dissection was used to  enter the peritoneal cavity. The  undersurface of the peritoneum was palpated and no adhesions were noted. The  #0 Vicryl suture was used to place a pursestring suture around the fascial  opening. The Hasson cannula was inserted and secured with a stay suture.  Pneumoperitoneum was obtained by insufflating with CO2 and maintaining a  maximum pressure of 15 mmHg. The patient was rotated in reverse  Trendelenburg position, rotated slightly to her left. A quick visual  examination of the abdomen showed no obvious abnormalities. The patient has  a large amount of adipose tissue in her omentum preventing a thorough  examination. I was unable to visualize an appendix in the right lower  quadrant. However, we did not put any extra ports in to try to further  dissect in the right lower quadrant.   A 10 mm port was placed in the subxiphoid position. Two 5 mm ports were  placed in the right upper quadrant. The gallbladder was grasped with clamps  and elevated over the edge of the liver. There were no adhesions to the  surface of the gallbladder. The peritoneum was very friable and flimsy. A  minimal amount of  blunt dissection was needed to open the hilum of the  gallbladder. The cystic duct was circumferentially dissected. This was  ligated with a clip distally. A small opening was created on the cystic  duct. A Eckhardt cholangiogram catheter was inserted through a stab incision and  inserted into the cystic duct. This was secured with a clip. A cholangiogram  was then obtained using fluoroscopy which showed a thin cystic duct with  good flow of the contrast proximally and distally. There was good flow down  into the duodenum. There was no evidence of obstruction. The catheter was  then removed and the duct was ligated with 2 clips. We then began dissecting  the cystic artery. This appeared to fork. Both branches were ligated with  clips and divided. Cautery was then used to remove the gallbladder from  the  liver bed. The gallbladder was placed in an EndoCatch sac. Hemostasis in the  liver bed was obtained with the cautery. The right upper quadrant was  thoroughly irrigated and there was no evidence of bleeding or bile leak. The  gallbladder and bag were removed through the umbilical incision. A  pursestring suture was used to close the umbilical fascia. A 4-0 Monocryl  was used to close the skin in a subcuticular fashion. Steri-Strips and clean  dressing were applied. The patient was extubated and brought to the recovery  room in stable condition. All sponge, instrument and needle counts were  correct.      Imogene Burn. Tsuei, M.D.  Electronically Signed     MKT/MEDQ  D:  06/28/2005  T:  06/28/2005  Job:  [id number]

## 2010-12-03 NOTE — Op Note (Signed)
NAMEALEXANDERA, KUNTZMAN NO.:  [id number]   MEDICAL RECORD NO.:  [id number]          PATIENT TYPE:  OIB   LOCATION:  9319                          FACILITY:  [facility]   PHYSICIAN:  Daleen Bo. Gaetano Net, M.D. DATE OF BIRTH:  Mar 03, 1946   DATE OF PROCEDURE:  02/27/2006  DATE OF DISCHARGE:                                 OPERATIVE REPORT   PREOPERATIVE DIAGNOSIS:  1. Uterine leiomyomata.  2. Postmenopausal bleeding.  3. Pelvic relaxation.   POSTOPERATIVE DIAGNOSIS:  1. Uterine leiomyomata.  2. Postmenopausal bleeding.  3. Pelvic relaxation.   PROCEDURE:  1. Laparoscopically assisted vaginal hysterectomy.  2. Bilateral salpingo-oophorectomy.  3. Anterior vaginal repair with grafts and posterior vaginal repair with      grafts   SURGEON:  Daleen Bo. Gaetano Net, M.D.   ASSISTANT:  Marylynn Pearson, MD   ANESTHESIA:  General with endotracheal intubation.   SPECIMENS:  Uterus, bilateral tubes, and ovaries to pathology.   ESTIMATED BLOOD LOSS:  600 mL.   INDICATIONS AND CONSENTS:  The patient is a 65 year old married white female  G2, P2 with complaints of postmenopausal bleeding.  Known uterine  leiomyomata and pelvic relaxation.  Details are dictated in the history and  physical.  Laparoscopically-assisted vaginal hysterectomy with bilateral  salpingo-oophorectomy, anterior-posterior repair with grafts has been  discussed preoperatively.  The potential risks and complications have been  discussed preoperatively; including but limited to, infection; bowel,  bladder, or ureteral damage; bleeding requiring transfusion of blood  products with possible transfusion reaction; HIV and hepatitis acquisition;  DVT; PE; pneumonia; fistula formation; erosion of the vaginal mucosa  postoperative dyspareunia, recurrent pelvic relaxation, and possible  development of urinary continence.  A laparotomy also discussed.  All  questions were answered and consent was signed on the chart.   FINDINGS:  There were some filmy adhesions in the right upper quadrant.  There is adhesion of the omentum inferior and left of the umbilicus.  In the  pelvis the uterus is about 6 weeks in size, smooth in contour.  Anterior  posterior cul-de-sacs were normal.  Tubes and ovaries were normal  bilaterally.   DESCRIPTION OF PROCEDURE:  The patient is taken to operating room where she  is identified, placed in dorsal supine position, and general anesthesia is  introduced via general endotracheal intubation.  She is then placed in  dorsal lithotomy position where she is prepped; bladder is straight  catheterized.  Hulka tenaculum is placed; and the uterus was manipulated.  She is draped in a sterile fashion.  The infraumbilical and suprapubic areas  are injected with 1/2% Marcaine; and a small infraumbilical incision is  made.  A disposable Veress needle was placed on the first attempt with a  normal syringe and drop test.  Then 2 liters of gas were insufflated under  low pressure with good tympany in the right upper quadrant.   A 10/11 bladeless XL disposable trocar sleeve is then placed using direct  visualization with the diagnostic laparoscope.  Following this a small  suprapubic incision is made and a 5-mm Excel disposable trocar sleeve is  placed under direct visualization without difficulty.  The above findings  are noted.  There is a lot of retroperitoneal adipose tissue.  In order to  obtain adequate visualization.  A small right lower quadrant incision was  made and a 5-mm XL bladeless disposable trocar sleeve was placed under  direct visualization without difficulty.   Then using the gyrus bipolar cautery cutting instrument, the right  infundibulopelvic ligament is taken down; taken across the round ligament,  down to the level of the vesicouterine peritoneum.  A similar procedure is  carried out on the left side.  Vesicouterine peritoneum is taken down in the  midline and sharply  dissected.  Good hemostasis is noted and the inferior  trocar sleeves are removed; and attention is turned to the vagina.   Posterior cul-de-sac is entered sharply; and the cervix is circumscribed  with unipolar cautery.  Mucosa is advanced sharply and bluntly.  Then using  the gyrus bipolar instrument, the uterosacral ligaments followed by the  bladder pillars, cardinal ligaments, uterine vessels were taken bilaterally.  Fundus is delivered posteriorly.  Remained pedicles are taken down and the  specimen is delivered.  Some bleeding is noted in the peritoneal cavity.  Due to the large amount of retroperitoneal adipose tissue, the bleeder could  not be visualized from below.  Therefore, the cuff is closed using #0  Monocryl sutures to first plicate the uterosacral ligaments; and the cuff  bilaterally; and then to plicate the uterosacral ligaments in the midline  with a separate suture.  The cuff was closed with a figure-of-eight and a  Foley catheter is placed.   Attention is then returned to the abdomen.  In order to obtain adequate  visualization, a left lower quadrant incision is made and a 10/11 XL  bladeless trocar sleeve is placed under direct visualization without  difficulty.  Using the pull sucker to remove the clot; a pumper is noted at  the right adnexa.  This is controlled with bipolar cautery.  All clot and  fluid is removed.  Examination under reduced pneumoperitoneum reveals  excellent hemostasis.  The inferior trocar sleeves are all removed; and  attention is returned to the vagina.  The anterior vaginal mucosa is  injected with 1/2% Xylocaine with 1:200,000 epinephrine.  It is then taken  down in the midline and dissected bilaterally from the underlying bladder.  Then using the Capio needle driver, and #0 Ethibond suture.  A suture was  placed in each sacrospinous ligament, 1-2 fingerbreadths medial to the spine.  Two additional sutures were then placed bilaterally along  the white  line.  The sacrospinous sutures are then placed through a [employer]  Bovine Xeroform graft which has been trimmed to fit on the posterior angles.  These were then sewn into place.   Then the remaining sutures are sewn into the graft bilaterally which are  then tied down.  Excellent support is noted.  Excess mucosa is trimmed and  the anterior vaginal mucosa is closed in a running locking fashion with 2-0  Monocryl suture.  The posterior vaginal mucosa is then injected.  A diamond  shaped wedge of tissue is resected from the perineal body; and the mucosa is  taken down in the midline from the underlying rectum.  This is dissected  bilaterally, sharply and bluntly; again, #0 Ethibond sutures were placed  through the sacrospinous ligaments bilaterally.  After trimming, a second  piece of graft material identical to the first, to fit the  posterior corners  are tied down.  One additional suture on each side was plicated to the  fascia bilaterally prior to placement of the graft; and is now sewn into the  graft and tied down.  Then #0 Monocryl suture is used to anchor the anterior  corners to the pelvic sidewalls.  Posterior perineal body is bolstered with  #0 Monocryl sutures.  Excess mucosa is trimmed and the mucosa is closed in a  running locking fashion with 2-0 Monocryl suture.  A plain pack with an  Estrace vaginal cream is then placed in the vagina.  A Foley catheter was  already in place.   Attention was turned to the abdomen where the pneumoperitoneum is completely  reduced; the umbilical trocar sleeve [place] removed.  The 10/11 incisions are  closed with interrupted 2-0 Vicryl suture on the skin.  Dermabond is placed  on all the incisions.  All counts are correct.  The patient is awakened,  taken to the recovery room in stable condition.      Daleen Bo Gaetano Net, M.D.  Electronically Signed     JET/MEDQ  D:  02/27/2006  T:  02/27/2006  Job:  [id number]

## 2010-12-03 NOTE — Op Note (Signed)
NAME:  [name], [name]                          ACCOUNT NO.:  [id number]   MEDICAL RECORD NO.:  [id number]                   PATIENT TYPE:  INP   LOCATION:  2899                                 FACILITY:  [facility]   PHYSICIAN:  Estill Bamberg. Ronnie Derby, M.D.              DATE OF BIRTH:  [birth year]   DATE OF PROCEDURE:  08/25/2003  DATE OF DISCHARGE:                                 OPERATIVE REPORT   PREOPERATIVE DIAGNOSIS:  Left knee osteoarthritis.   POSTOPERATIVE DIAGNOSIS:  Left knee osteoarthritis.   OPERATION PERFORMED:  Left total knee arthroplasty.   SURGEON:  Estill Bamberg. Ronnie Derby, M.D.   ASSISTANT:   ANESTHESIA:  General.   INDICATIONS FOR PROCEDURE:  The patient is an older white female who had  failed conservative measures for osteoarthritis of the knee.  The other knee  had been replaced a couple of years ago and had done well.  Informed consent  was obtained for knee replacement on the left side.   DESCRIPTION OF PROCEDURE:  The patient was laid supine and administered  general anesthesia.  Foley catheter placement.  Left leg was placed in a  tourniquet and then prepped and draped in the usual sterile fashion.  A  standard midline incision was used approximately 10 cm in length.  A fresh  blade was used to make a median parapatellar arthrotomy and perform  synovectomy.  I then elevated the deep MCL off the medial crest of the tibia  subperiosteally.  Then I everted the patella, removed the osteophytes and  measured that 20 mm thick.  I reamed down to a depth of 13 mm and used a 32  mm template to drill three lug holes with prosthetic trial in place that  also measured 20 mm.  I then left the knee cap inverted and went into  flexion position and cut the ACL and the PCL and subluxed the tibia forward.  I then used the extramedullary alignment jig to remove a perpendicular cut  of the tibial articular surface with 2 mm of bone being removed from the  medial tibial plateau.  I then  turned attention to the femur where I made  intramedullary drill hole and set our distal femoral guide on 6 degree  valgus cut, tamped it down to the distal aspect of the femur and pinned our  distal femoral cutting block in place.  I then removed the intramedullary  drill and made the distal femoral cut with a sagittal saw.  I then marked  out an epicondylar axis and measured the posterior condylar angle to three  degrees, I sized the femur to a size D and pinned the size D cutting block  in place.  I then made the anterior posterior chamfer cuts with a sagittal  saw.  I removed the excess pieces of bone and removed the cutting block.  I  then placed a lamina spreader in  the lateral compartment and removed the  medial meniscus, the ACL, PCL and posterior condylar osteophytes.  I then  switched the lamina spreader to the medial compartment and removed the  lateral meniscus and posterolateral condylar osteophytes.  I then placed a  10 mm spacer block in the knee.  I had good flexion-extension gap balancing.  I  then measured the femur with the size 50 finishing block, finished the  tibia with a size 3 tibial tray and trialed with a size 3 tibial, size D  femur and size 10 insert and 32 mm patella.  Flexion and extension and  patellar tracking were perfect.  I then removed the trial components,  copiously irrigated and cemented in the size 3 tibia, size D femur, snapped  in the real polyethylene after removing all excess cement.  I then cemented  in the 32 mm patella and located the knee.  I then let the cement harden and  then I closed the arthrotomy with interrupted #1 Vicryl sutures, I left the  Hemovac deep to the arthrotomy coming out superolaterally.  I then closed  with interrupted 0 and 2-0 Vicryls and skin staples.  I did leave the pain  catheter superficial to the arthrotomy and deep to the deep layer closure.  I then dressed with Adaptic, 4 x 4s, ABD's, TED stockings.   TOURNIQUET  TIME:  50 minutes.   COMPLICATIONS:  None.   DRAINS:  None.   ESTIMATED BLOOD LOSS:  300 mL.                                               Estill Bamberg. Ronnie Derby, M.D.    SDL/MEDQ  D:  08/25/2003  T:  08/25/2003  Job:  [id number]

## 2010-12-03 NOTE — Assessment & Plan Note (Signed)
[facility]                             PULMONARY OFFICE NOTE   SALAM, CHESTERFIELD                       MRN:          [id number]  DATE:08/30/2006                            DOB:          May 10, 1946    List which is unchanged. She continues allergy vaccine at 1-10.   OBJECTIVE:  Weight 183 pounds, blood pressure 124/80, pulse regular 96,  room air saturation 92%. Congested cough. Heart sounds are normal. No  adenopathy found. No edema.   LABORATORY DATA:  On blood work from February 12, her white blood count  was elevated on steroids at 14700. Platelet count was 437000. There were  81% neutrophils. No eosinophils. Limited CT of the sinuses done at  [facility] on 08/22/2006 was consistent with acute sinusitis  in the maxillary sinuses.   IMPRESSION:  Rhinosinusitis, significantly aggravating her underlying  rhinitis and asthmatic bronchitis.   PLAN:  1. Depo-Medrol 80 mg i.m.  2. Prednisone 8 day taper from 40 mg back to maintenance 5 mg daily.  3. Xopenex nebulizer treatment today 1.25 mg.  4. She will keep her scheduled appointment.   INCOMPLETE     Clinton D. Annamaria Boots, MD, Shade Flood, FACP  Electronically Signed    CDY/MedQ  DD: 08/30/2006  DT: 08/31/2006  Job #: [id number]

## 2010-12-03 NOTE — Assessment & Plan Note (Signed)
[facility]                             PULMONARY OFFICE NOTE   Hailey, [name]                       MRN:          [id number]  DATE:08/18/2006                            DOB:          [birth year]    PROBLEMS:  1. Allergic rhinitis.  2. Asthmatic bronchitis.  3. Esophageal reflux.  4. Rheumatoid arthritis/chronic prednisone.   HISTORY:  Bothersome postnasal drainage, some cough and sneeze.  Coughing hurts the back of her throat, especially in the last 2-3 days.  Otherwise, she is feeling better overall.  She does notice yellow  postnasal drainage.  She reports recent blood work showed normal liver  function.  She started a Nutri-System diet, but it caused loose stools  so she stopped it.   MEDICATIONS:  1. Advair 500/50 b.i.d.  2. Flonase.  3. Allegra-D.  4. Singulair.  5. Prednisone 5 mg daily.  6. Lipitor 10 mg 1/2.  7. Triamterine/HCTZ 75/50.  8. Verapamil 240 mg.  9. Librax.  10.Xanax 0.5 mg.  11.Protonix.  12.Gemfibrozil 600 mg b.i.d.  13.Potassium.  14.Vitamin D.  15.Plaquenil.  16.Albuterol inhaler p.r.n.  17.Tussionex.  18.Darvocet.  19.Tylenol.  20.She continues allergy vaccine at 1:10 with no problems and has an      EpiPen which she has not needed to use.   OBJECTIVE:  Weight 182 pounds, BP 132/68, pulse regular 101, room air  saturation 96%.  There is obvious thrush.  She is hoarse, moderate nasal  congestion.  I cannot recognize postnasal drainage.  There is no  cervical adenopathy.  Her chest is quiet.   IMPRESSION:  Current active complaints may include a low-grade sinusitis  and the obvious thrush.   PLAN:  1. Limited CT scan of the sinuses.  2. Diflucan 150 mg daily x2 days.  3. She will hold her Lipitor beginning day before the Diflucan and      resuming a day after she stops the Diflucan.  4. Scheduled to return in 4 months, earlier p.r.n.     Clinton D. Annamaria Boots, MD, Shade Flood, [place]  Electronically  Signed    CDY/MedQ  DD: 08/19/2006  DT: 08/19/2006  Job #: [id number]   cc:   Daleen Bo. Gaetano Net, M.D.  Lindaann Slough, M.D.  Claris Gower, M.D.

## 2010-12-03 NOTE — Assessment & Plan Note (Signed)
[name]                             PULMONARY OFFICE NOTE   [name], [name]                       MRN:          [id number]  DATE:10/19/2006                            DOB:          [birth year]    PROBLEMS:  1. Allergic rhinitis.  2. Asthmatic bronchitis.  3. Esophageal reflux.  4. Rheumatoid arthritis on chronic prednisone.   HISTORY:  She had a gastroenteritis syndrome a month ago which has  resolved. Dr. Charlestine Night today gave her Diflucan for 7 days and Nystatin  oral solution for persistent thrush. She says that her chest is okay  with no significant or cough. Ears, nose, and throat have been itching  or burning with some sneeze. Dr. Charlestine Night has given her a slow prednisone  taper schedule from 5 mg prednisone daily. Feet itch at night with no  visible peeling or rash.   MEDICATIONS:  Her list is charted and reviewed. We have emphasized the  steroid stimulus for thrush related to using Advair 500/50 and systemic  prednisone.   OBJECTIVE:  Weight 170 pounds, blood pressure 122/80, pulse regular 106,  room air saturation 97%. Oral pharynx looks like thrush with an  erythematous base and specks of white which can be wiped off. There is  no strider. Lung fields are quiet and clear.   IMPRESSION:  Steroids are maintaining thrush and we have discussed  efforts to improve this.   PLAN:  1. She will add over-the-counter Claritin to her Allegra p.r.n.  2. Throat swab is being sent for fungal smear culture and sensitivity      to determine to whether she has a candidiasis species which is      resistant.  3. Change Advair 500 to Symbicort 80/4.5 two puffs b.i.d. using a      spacer.  64. Follow Dr. Elmon Else schedule for slow taper off of prednisone.  5. Schedule return 6 weeks, earlier p.r.n.     Clinton D. Annamaria Boots, MD, Shade Flood, [place]  Electronically Signed    CDY/MedQ  DD: 10/21/2006  DT: 10/22/2006  Job #: [id number]   cc:   Daleen Bo. Gaetano Net,  M.D.  Lindaann Slough, M.D.  Claris Gower, M.D.

## 2010-12-03 NOTE — Assessment & Plan Note (Signed)
 [facility]                             PULMONARY OFFICE NOTE   [name], [name]                       MRN:          [id number]  DATE:07/20/2006                            DOB:          [birth year]    PROBLEMS:  1. Allergic rhinitis.  2. Asthmatic bronchitis.  3. Esophageal reflux.  4. Rheumatoid arthritis/chronic prednisone.   HISTORY:  Nasal congestion with cough and wheezing began at Christmas  time at her daughter's house where she blames a live Christmas tree. She  traveled back and forth and was around a lot of people. It began  progressive cough. Nasal saline lavage helps. Tussionex also helps.  Telephone triage yesterday had her increase prednisone to 20 mg daily  for 5 days and added a Z-Pak. Sputum is described as white and thick,  hanging in her throat.   MEDICATIONS:  1. Allergy vaccine at 1:10.  2. Advair 500/50 b.i.d.  3. Flonase.  4. Allegra D.  5. Singulair.  6. Maintenance prednisone therapy at 5 mg daily.  7. Lipitor 1/2 x 10 mg.  8. Triamterene/hydrochlorothiazide 75/50 one-half daily.  9. Verapamil 240 mg.  10.Librax.  11.Xanax 0.5 mg.  12.Protonix.  13.Gemfibrozil 600 mg b.i.d.  14.Potassium.  15.Vitamin D.  16.Albuterol inhaler.  17.Plaquenil.  18.Tussionex.  19.She has an EpiPen.   OBJECTIVE:  Weight is 182 pounds. BP 136/84.  Pulse regular at 97. Room  air saturation 95%.  Minor stuffy nose.  Chest sounds clear.  She is overweight.  Pharynx is somewhat red.  She is talkative. With laughter, she generates a congested-sounding  cough, which is non-productive.   IMPRESSION:  Asthma with chronic bronchitis. Allergic, viral and reflux  triggers are all likely involved currently.   PLAN:  1. Reflux precautions.  2. Continue saline lavage.  3. Nebulizer treatment of Xopenex 1.25 mg.  4. Depo-Medrol 80 mg IM. We had a steroid talk.  5. Add Tessalon Perles 100 mg 1 or 2 q.6 hours p.r.n. cough.  6. Continue  Tussionex p.r.n. with discussion.  7. Keep scheduled appointment, earlier as needed.     Clinton D. Annamaria Boots, MD, Shade Flood, [place]  Electronically Signed    CDY/MedQ  DD: 07/20/2006  DT: 07/20/2006  Job #: [id number]   cc:   Daleen Bo. Gaetano Net, M.D.  Lindaann Slough, M.D.  Claris Gower, M.D.

## 2010-12-03 NOTE — H&P (Signed)
NAMESHYNIA, DALEO NO.:  [id number]   MEDICAL RECORD NO.:  [id number]          PATIENT TYPE:  AMB   LOCATION:  [place]                           FACILITY:  [facility]   PHYSICIAN:  Daleen Bo. Gaetano Net, M.D. DATE OF BIRTH:  12-12-1945   DATE OF ADMISSION:  DATE OF DISCHARGE:                                HISTORY & PHYSICAL   DATE OF ADMISSION:  October 05, 2005   CHIEF COMPLAINT:  Postmenopausal bleeding.   HISTORY OF PRESENT ILLNESS:  This patient is a 65 year old married white  female G2, P2 who has had some postmenopausal bleeding. Ultrasound on  August 17, 2005, was consistent with 14 mm and 10 mm polypoid structures in  the endometrial cavity. There were also multiple small uterine leiomyomata  which were unchanged. After discussion of the options, she is being admitted  for hysteroscopy D&C. Potential risks and complications have been discussed  preoperatively.   PAST MEDICAL HISTORY:  1.  Chronic hypertension.  2.  Asthma.  3.  Spastic colon.  4.  History of cystitis.   PAST SURGICAL HISTORY:  1.  Right knee replacement.  2.  Mid foot fusion.  3.  D&C x2.  4.  C-section.  5.  Tonsillectomy.  6.  Heel and toe spurs.  7.  Hysteroscopy D&C.  8.  Cholecystectomy.  9.  Esophageal scope with removal of stent status post cholecystectomy.   OBSTETRICAL HISTORY:  Vaginal delivery x1, cesarean section x1.   FAMILY HISTORY:  Chronic hypertension in mother and sister, stomach cancer  in maternal grandmother, Bright's disease in grandfather.   SOCIAL HISTORY:  Consumes alcohol on a social basis. Denies tobacco or drug  abuse.   MEDICATIONS:  Singulair, Lipitor, Plaquenil, prednisone 5-10 mg daily,  triamterene, verapamil, Robinul, Flonase, Advair, Protonix, Allegra,  vitamins, Darvocet p.r.n., Librax p.r.n., Xanax p.r.n.   REVIEW OF SYSTEMS:  NEURO:  Denies headache. CARDIO:  Denies chest pain.  PULMONARY:  Recent upper respiratory infection status post  antibiotic  treatment. GI:  Recent cholecystectomy as above.   PHYSICAL EXAMINATION:  VITAL SIGNS:  Height 5 feet 2 inches, weight 174  pounds. Blood pressure 122/84.  HEENT:  Without thyromegaly.  LUNGS:  Clear to auscultation.  HEART:  Regular rate and rhythm.  BACK:  Without CVA tenderness.  BREASTS:  Without mass, retraction, or discharge.  ABDOMEN:  Obese, soft, nontender, without palpable masses.  PELVIC:  Vulva, vagina, cervix without lesions. Uterus normal size and  mobile in contour. Adnexa:  Nontender without masses.  EXTREMITIES:  Grossly within normal limits.  NEUROLOGIC:  Grossly within normal limits   ASSESSMENT:  Postmenopausal bleeding.   PLAN:  Hysteroscopy, dilatation, curettage.      Daleen Bo Gaetano Net, M.D.  Electronically Signed     JET/MEDQ  D:  09/29/2005  T:  09/29/2005  Job:  [id number]

## 2010-12-03 NOTE — Consult Note (Signed)
NAMEJUEL, RIPLEY NO.:  [id number]   MEDICAL RECORD NO.:  [id number]          PATIENT TYPE:  INP   LOCATION:  0159                         FACILITY:  [facility]   PHYSICIAN:  Lear Ng, MDDATE OF BIRTH:  1945/08/22   DATE OF CONSULTATION:  07/01/2005  DATE OF DISCHARGE:                                   CONSULTATION   CONSULTING PHYSICIAN:  Lear Ng, M.D.   REQUESTING PHYSICIAN:  Imogene Burn. Tsuei, M.D.   HISTORY OF PRESENT ILLNESS:  This consult was completed at the request of  Dr. Donnie Mesa in regards to her evaluation for possible bile leak.  Ms.  [name] is a 65 year old white female past medical history of irritable bowel  syndrome and gallstones.  Ultrasound earlier this year showed gallstones but  no evidence of cholecystitis.  Due to continued episodic epigastric pain  with negative upper GI series, the patient was referred to Dr. Georgette Dover for  possible symptomatic gallstones and she underwent a laparoscopic  cholecystectomy with intraoperative cholangiogram on June 28, 2005.  The  intraoperative cholangiogram was negative.  Due to continued abdominal pain  and leukocytosis, she was worked up for a possible bile leak.  She underwent  a hepatobiliary scan which was concerning for a bile leak and a CT scan  which showed a large amount of fluid around the liver concerning for a bile  leak.  She was subsequently scheduled for a repeat surgery, on July 01, 2005, to evaluate for a bile leak and had a drain placed which is draining  bilious fluid.  Currently, she is hemodynamically stable in the intensive  care unit and is being transitioned to a PCA pump for pain control.  Most  recent white blood count is 18.3 and it was 9.6 on June 24, 2005.  Hemoglobin 14.4 and stable.  She currently is on Zosyn for antibiotic  coverage.   PAST MEDICAL HISTORY:  See above.   CURRENT MEDICINES:  1.  Zosyn 3.375 grams IV q.6h.  2.  Diflucan  200 mg IV every day.  3.  Advair.  4.  Protonix.  5.  Prednisone 5 mg every day.  6.  Dilaudid PCA pump, 10 mg IV q.4h.   ALLERGIES:  ERYTHROMYCIN causes GI upset.   PHYSICAL EXAMINATION:  VITAL SIGNS:  Temperature 97.8, pulse 110 to 120s,  blood pressure 130s to 150s over 75 to 80, O2 sat is 94% on 3 liters.  GENERAL:  Lethargic, mild acute distress.  HEENT:  Nonicteric sclerae.  Oropharynx clear.  CHEST:  Clear to auscultation anteriorly.  CARDIOVASCULAR:  Regular rate and rhythm without murmurs.  ABDOMEN:  Tender diffusely, obese, mild distention, surgical dressings in  place with a JP drain attached with bilious fluid draining.  EXTREMITIES:  No edema.   LABORATORY:  White blood count 18.3, hemoglobin 14.4, hematocrit 42.7,  platelet count 339, differential pending.  Sodium 137, potassium 3.0,  chloride 100, CO2 29, BUN 14, creatinine 1.0, glucose 104.  T-bili 1.0, ALP  46, AST 29, ALT 35, total protein 5.4, albumin 3.0, calcium 8.6.  Amylase 65.   ASSESSMENT:  A 65 year old white female, status post recent laparoscopic  cholecystectomy with signs and symptoms of a bile leak.  The patient needs  to have an ERCP done within the next 24 hours for possible stent placement  to help her bile leak resolve.  She should remain on Zosyn for antibiotic  coverage, and she is on Diflucan empirically due to reported high incidence  of yeast infections in Ms. Senft with antibiotics, per family.  Agree with  PCA pump for pain control and NPO for bowel rest.  Dr. Watt Climes will perform an  ERCP on the morning of July 01, 2005.  We will continue to follow  closely with our surgical colleagues.      Lear Ng, MD  Electronically Signed     VCS/MEDQ  D:  07/01/2005  T:  07/01/2005  Job:  [id number]   cc:   Jeryl Columbia, M.D.  Fax: [phone]   Imogene Burn. Georgette Dover, M.D.  [place] [place] [address]

## 2010-12-03 NOTE — Discharge Summary (Signed)
[place]  Patient:    [name], [name] Visit Number: [id number] MRN: [id number]          Service Type: MED Location: 1062 [id number] Attending Physician:  Lara Mulch Dictated by:   Evert Kohl, P.A.-C Admit Date:  05/07/2001 Discharge Date: 05/11/2001   CC:         [facility]   Discharge Summary  ADMISSION DIAGNOSES: 1. 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 2. Hypercholesterolemia. 3. Hypertension. 4. Asthma. 5. Multiple environmental and seasonal allergies.  DISCHARGE DIAGNOSES: 1. Right total knee arthroplasty. 2. Constipation. 3. Diarrhea. 4. Hypertension. 5. Hypercholesterolemia. 6. Asthma. 7. Multiple allergies.  HISTORY OF PRESENT ILLNESS:  The patient is a 65 year old white female with a long-term history of right knee pains.  The patient had multiple Cortisone injections over several years with good relief.  Over the last 3 to 4 years the pain has progressively worsened, and Cortisone injections became less effective.  The patient states currently the pain is a constant aching sensation with radiation up and down the leg, it is mostly noted in the lateral aspect of the joint.  She does have clicking, popping, and swelling. She does not have night pain.  The pain improves with rest, and she is not using any assistive devices.  ALLERGIES:  ERYTHROMYCIN.  CURRENT MEDICATIONS:  1. Lipitor 10 mg p.o. q.h.s.  2. Triamterene/hydrochlorothiazide 75/50 1/2 tablet p.o. q.d.  3. Verapamil SA 240 mg q.d.  4. Allegra D one tablet p.o. b.i.d.  5. Accolate 20 mg p.o. b.i.d.  6. Femhrt 15 p.o. q.d.  7. Vioxx 25 mg p.o. q.d.  8. Advair 250 mcg/50 mcg p.o. b.i.d.  9. Flonase 50 mcg two each nare q.d. 10. Fosamax 70 mg p.o. every week. 11. OxyContin CR 10 mg p.o. q.12h. 12. Multivitamins.  SURGICAL PROCEDURE:  On May 07, 2001, the patient was taken to the OR by Dr. Lara Mulch,  assisted by Zenaida Deed, P.A.-C.  Under general anesthesia, the patient had a right total knee replacement.  The patient tolerated the procedure well.  There were no complications.  One Hemovac drain was left in place.  Tourniquet time was estimated one hour.  CONSULTATIONS:  On May 07, 2001, routine consults for physical therapy, occupational therapy, and case management.  HOSPITAL COURSE:  On May 07, 2001, the patient was admitted to [facility] under the care of Dr. Lara Mulch.  The patient was taken to the OR where a right total knee arthroplasty was performed.  The patient then incurred a four day postoperative course in which the only significant untoward events were some constipation, followed by some diarrhea after an enema and laxative.  The patient progressed nicely with physical therapy.  Her chemistries and labs remained stable.  She remained afebrile.  Vital signs were also stable.  The patient initially was treated with IV PCA medication, but was transferred over to OxyContin CR 20 mg q.12h., and Percocet p.r.n., and this provided good pain relief.  It was felt that on postoperative day #4, that the patient was both medically and orthopedically stable and ready to be discharged home, and the patient was eager to leave.  The patient was discharged to home in good condition.  LABORATORY DATA:  CBC on May 10, 2001, white blood cell count 10.2, hemoglobin 9.9, hematocrit 29.3, platelets 333.  CMET with sodium 138, potassium 4.1, glucose 123, BUN 2, creatinine 0.7.  Urinalysis on  admission was normal.  Chest x-ray on admission showed mild bronchitic changes compatible with the history of asthma, no acute abnormality.  EKG was normal sinus rhythm, normal EKG at 83 beats per minute.  DISCHARGE MEDICATIONS TO ORTHOPEDIC FLOOR:  1. Colace 100 mg p.o. b.i.d.  2. Senokot 8.6 mg b.i.d. a.c.  3. Lovenox 40 mg subcutaneously q.12h., changed to 40 mg  subcutaneously q.d.  4. Zocor 20 mg p.o. q.h.s.  5. Triamterene/hydrochlorothiazide 75/50 1/2 tablet p.o. q.d.  6. Verapamil 240 mg p.o. q.d.  7. Singular 10 mg p.o. q.d.  8. Claritin 10 mg p.o. q.d.  9. Pseudoephedrine 120 mg p.o. b.i.d. 10. Advair one puff b.i.d. 11. Multivitamins one capsule p.o. q.d. 12. ______ nasal spray two sprays q.d. 13. OxyContin CR 20 mg p.o. q.12h. 14. Vioxx 50 mg p.o. q.d. 15. Maalox 30 mL p.o. p.r.n. 16. Laxative or enema of choice p.r.n. 17. Percocet one or two tablets p.o. q.4-6h. p.r.n. pain. 18. Tylenol 650 mg p.o. q.4h. p.r.n. 19. Robaxin 500 mg p.o. q.6-8h. p.r.n. 20. Reglan 10 mg p.o. q.8h. p.r.n.  DISCHARGE MEDICATIONS: 1. The patient is to continue routine home medications. 2. OxyContin CR 10 mg two tablets q.12h. p.r.n. pain, may decrease during the    day as tolerated. 3. Percocet 5 mg one or two tablets q.4-6h. p.r.n. pain. 4. Lovenox 40 mg one injection q.d. 5. Reglan 10 mg one tablet q.6h. p.r.n. nausea.  ACTIVITY:  As tolerated with the use of walker, no driving.  DIET:  No restrictions.  WOUND CARE:  Keep wound clean and dry, may shower, check daily for infection.  [facility] for home health physical therapy.  FOLLOWUP:  Call [phone] for a follow-up appointment in 10 days with Dr. Ronnie Derby.  CONDITION ON DISCHARGE:  Improved and good. Dictated by:   Evert Kohl, P.A.-C Attending Physician:  Lara Mulch DD:  05/11/01 TD:  05/14/01 Job: [id number]

## 2010-12-03 NOTE — Procedures (Signed)
[facility]  Patient:    [name], [name] Visit Number: [id number] MRN: [id number]          Service Type: END Location: ENDO Attending Physician:  Orvis Brill Dictated by:   Jeryl Columbia, M.D. Proc. Date: 10/25/01 Admit Date:  10/25/2001   CC:         Daleen Bo. Lyn Hollingshead, M.D.   Procedure Report  PROCEDURE:  Colonoscopy.  INDICATION:  Patient with longstanding IBS due for colonic screening.  Consent was signed after risks, benefits, methods, and options thoroughly discussed in the office.  MEDICATIONS:  Demerol 100, Versed 10.  DESCRIPTION OF PROCEDURE:  Rectal inspection was pertinent for external hemorrhoids.  Digital exam was negative.  The pediatric video adjustable colonoscope was inserted, and almost instantaneously she began having pain. We tried abdominal pressure and rolling her on her back.  There was no obvious looping; however, when we rolled her on her right side, her pain seemed to go away and we were easily, at that juncture, able to advance to the cecum. We had tried multiple withdrawing and advancing and abdominal pressures to try to help prior to rolling her on her right side.  On insertion, no abnormalities were seen.  The cecum was identified by the appendiceal orifice and the ileocecal valve.  In fact, the scope was inserted a short ways in the terminal ileum which was normal.  Photodocumentation and scattered biopsies were obtained.  The scope was slowly withdrawn.  Other than some left-sided occasional diverticula, no abnormalities were seen.  Some random colon biopsies were obtained and put in the second container.  Once back in the rectum the scope was retroflexed, pertinent for some internal hemorrhoids. The patient tolerated withdrawal well.  The scope was straightened; air was suctioned and the scope removed.  The patient tolerated the beginning part of the procedure only fair, the rest of the procedure well.   There was no obvious immediate complication.  ENDOSCOPIC DIAGNOSES: 1. Internal and external hemorrhoids. 2. Left-sided diverticula. 3. Otherwise within normal limits to the terminal ileum, status post random    biopsies throughout.  PLAN: 1. Await pathology. 2. Continue Robinul. 3. Consider an upper GI small-bowel follow-through next. 5. Follow up p.r.n. or in 2-3 months to recheck symptoms and make sure no    further work-up or plans are needed. Dictated by:   Jeryl Columbia, M.D. Attending Physician:  Orvis Brill DD:  10/25/01 TD:  10/26/01 Job: [id number]

## 2010-12-03 NOTE — H&P (Signed)
[name], VOLPE NO.:  [id number]   MEDICAL RECORD NO.:  [id number]          PATIENT TYPE:  AMB   LOCATION:  [place]                           FACILITY:  [facility]   PHYSICIAN:  Daleen Bo. Gaetano Net, M.D. DATE OF BIRTH:  05-Jan-1946   DATE OF ADMISSION:  02/27/2006  DATE OF DISCHARGE:                                HISTORY & PHYSICAL   CHIEF COMPLAINT:  Postmenopausal bleeding and pelvic relaxation.   HISTORY OF PRESENT ILLNESS:  This patient is a 65 year old married white  female, G2, P2, status post hysteroscopy, D&C in 2006 and 2007, with benign  findings for postmenopausal bleeding.  She has persistent vasomotor symptoms  and is unable to go without estrogen replacement therapy.  This gives her  continued breakthrough bleeding.  She has known uterine leiomyomata.  She  also has discomfort with symptoms of pelvic relaxation.  She has occasional  complaints of leaking urine.  However, urodynamics do not confirm stress  urinary incontinence.  After a careful discussion of the options, she is  being admitted for laparoscopically assisted vaginal hysterectomy, bilateral  salpingo-oophorectomy, and anterior/posterior vaginal repair.  Potential  risks and complications had been reviewed preoperatively.   PAST MEDICAL HISTORY:  1. Chronic hypertension.  2. Asthma.  3. Spastic colon.  4. History of colitis.   PAST SURGICAL HISTORY:  1. Right knee replacement.  2. Mid foot fusion.  3. D&C multiple times.  4. Cesarean section.  5. Tonsillectomy.  6. Heel and toe spurs.  7. Hysteroscopy, D&C x2.  8. Cholecystectomy.  9. Esophageal duodenoscopy with removal of stent.  10.Status post cholecystectomy.   OBSTETRIC HISTORY:  1. Vaginal delivery x1.  2. Cesarean section x1.   FAMILY HISTORY:  Chronic hypertension in mother and sister.  Stomach cancer  in maternal grandmother.  __________ disease in grandfather.   SOCIAL HISTORY:  Consumes alcohol on a social basis.   Denies tobacco or drug  abuse.   MEDICATIONS:  1. Potassium 40 mEq daily.  2. Verapamil 240 mg daily.  3. Triamterene/hydrochlorothiazide 1/2 pill daily.  4. Vitamin D 50,000 units every two to three weeks.  5. Lipitor 1/2 pill daily.  6. Advair daily.  7. Singular one daily.  8. Allegra b.i.d.  9. Flonase one squirt in each nostril daily.  10.Protonix q.h.s.  11.Librax t.i.d.  12.Xanax one b.i.d.  13.Provera two q.h.s.  14.Estratest one daily.  15.Plaquenil daily.  16.Prednisone 10 mg daily.  17.Robinul p.r.n.  18.Gemfibrozil two daily.  19.Darvocet p.r.n.   ALLERGIES:  ERYTHROMYCIN.   REVIEW OF SYSTEMS:  NEUROLOGIC:  Denies headache.  GASTROINTESTINAL:  Denies  recent changes in bowel habits.  PULMONARY:  Asthma as above.   PHYSICAL EXAMINATION:  VITAL SIGNS:  Height 5 feet 2 inches, weight 174  pounds, blood pressure 122/82.  HEENT:  Without thyromegaly.  LUNGS:  Clear to auscultation.  HEART:  Regular rate and rhythm.  BACK:  Without CVA tenderness.  BREASTS:  Without mass or active discharge.  ABDOMEN:  Obese, soft, nontender, without palpable masses.  PELVIC:  Vulva, vagina, cervix without lesions.  Uterus is normal size,  quite mobile, nontender.  Adnexa nontender without masses.  Second degree  cystocele is noted.  There is adequate rectal sphincter tone.  Very lax  rectovaginal septum.  EXTREMITIES:  Grossly within normal limits.  NEUROLOGIC:  Grossly within normal limits.   ASSESSMENT:  1. Uterine leiomyomata.  2. Postmenopausal bleeding.  3. Pelvic relaxation.   PLAN:  Laparoscopically assisted vaginal hysterectomy, bilateral salpingo-  oophorectomy, and anterior/posterior vaginal repair.      Daleen Bo Gaetano Net, M.D.  Electronically Signed     JET/MEDQ  D:  02/21/2006  T:  02/21/2006  Job:  [id number]

## 2010-12-03 NOTE — Op Note (Signed)
NAME:  [name], [name] NO.:  [id number]   MEDICAL RECORD NO.:  [id number]          PATIENT TYPE:  AMB   LOCATION:  [place]                         FACILITY:  [facility]   PHYSICIAN:  Jeryl Columbia, M.D.    DATE OF BIRTH:  06/10/1946   DATE OF PROCEDURE:  09/01/2005  DATE OF DISCHARGE:                                 OPERATIVE REPORT   PROCEDURE:  Esophagogastroduodenoscopy with stent removal.   INDICATIONS:  Patient with a stent placed for bile leak __________  Consent  was signed after risks, benefits, methods, and options were discussed.   MEDICINES USED:  Phenergan 12.5, Demerol 70, Versed 7.   PROCEDURE:  The side-viewing video therapeutic duodenoscope inserted by  indirect vision into the stomach __________  pylorus into a normal appearing  duodenal bulb, around the C-loop to the normal periampullary area where the  previously placed stent was seen in the proper position.  The snare was  advanced, grabbed the stent, the stent was removed with the snare without  obvious difficulty.  The scope was slowly withdrawn back to the stomach and  a brief evaluation on a straight and retroflex view of the stomach was  obtained.  Some bilious material was suctioned, the scope was slowly  withdrawn.  The patient tolerated the procedure well, there was no obvious  immediate complications.   ENDOSCOPIC DIAGNOSIS:  1.  No obvious significant finding using the side-viewing scope.  2.  Stent in proper position status post removal.   PLAN:  Observe for delayed complication.  If none, can fully advance diet.  Follow up p.r.n. or in 3-4 months just to check symptoms, make sure no  further workup plans or medicine options are needed.           ______________________________  Jeryl Columbia, M.D.     MEM/MEDQ  D:  09/01/2005  T:  09/01/2005  Job:  [id number]   cc:   Imogene Burn. Georgette Dover, M.D.  [place] [facility] Gaetano Net, M.D.  Fax: [phone]   Claris Gower, M.D.  Fax: [phone]

## 2010-12-03 NOTE — Assessment & Plan Note (Signed)
[facility]                               PULMONARY OFFICE NOTE   [name], [name]                       MRN:          [id number]  DATE:03/27/2006                            DOB:          1946/06/11    PULMONARY FOLLOW-UP:   PROBLEM LIST:  1. Allergic rhinitis.  2. Asthmatic bronchitis.  3. Esophageal reflux.  4. Chronic prednisone therapy for arthritis.   HISTORY:  A Z-Pak helped a flare she was having just before her  hysterectomy, which then went fairly well.  Around the same time she had to  have a sublingual biopsy.  She has had some night cough, partly helped by  albuterol.  She has been using Allegra D for watery nose and itching eyes.   MEDICATIONS:  We reviewed her list, which was charted.  It includes:   1. Advair 500/50 mcg.  2. Flonase.  3. Allegra D.  4. Singulair.  5. Prednisone 5 mg daily.  6. An albuterol inhaler.   OBJECTIVE:  VITAL SIGNS:  Weight 185 pounds.  BP 120/80, pulse regular at  96, room air saturation 96%.  CHEST:  Lung fields are quiet today without accessory muscle use.  CARDIAC:  Heart sounds are regular without murmur.  HEENT:  There may be minimal pharyngeal erythema but no drainage and no  stridor.   IMPRESSION:  Seasonal increase in allergic rhinitis symptoms.  Her asthma  currently is adequately controlled.  Her other medical problems represent a  significant complication.   PLAN:  We refilled her epinephrine prescription and reviewed allergy vaccine  list and strategy, including policy on administration outside of a medical  office, anaphylaxis, epinephrine and  alternatives.  She reviewed and signed our waiver, wishing to continue  giving her own injections at home with her husband present.  She will  schedule return 3 months, earlier p.r.n.                                   Clinton D. Annamaria Boots, MD, FCCP, FACP   CDY/MedQ  DD:  03/27/2006  DT:  03/28/2006  Job #:  [id number]   cc:   Daleen Bo.  Gaetano Net, M.D.  Lindaann Slough, M.D.  Claris Gower, M.D.

## 2010-12-03 NOTE — Discharge Summary (Signed)
[name], [name] NO.:  [id number]   MEDICAL RECORD NO.:  [id number]          PATIENT TYPE:  INP   LOCATION:  0159                         FACILITY:  [facility]   PHYSICIAN:  Imogene Burn. Georgette Dover, M.D. DATE OF BIRTH:  1946-02-24   DATE OF ADMISSION:  07/01/2005  DATE OF DISCHARGE:  07/06/2005                                 DISCHARGE SUMMARY   ADMISSION DIAGNOSES:  Bile leak status post laparoscopic cholecystectomy.   DISCHARGE DIAGNOSES:  Bile leak status post laparoscopic cholecystectomy.   PROCEDURE:  1.  Diagnostic laparoscopy with placement of drain.  2.  Endoscopic retrograde cholangiopancreatography with stent placement.   BRIEF HISTORY:  The patient is a 65 year old female who underwent a  laparoscopic cholecystectomy on June 28, 2005 which was uncomplicated.  During that procedure, she underwent an intraoperative cholangiogram which  showed normal biliary anatomy and a reasonable length of cystic duct  remnant. Three  clips were placed on the cystic duct at that time. The  patient was discharged home later that day and was doing reasonably well.  She actually felt well enough to go out to eat on June 28, 2005.  However, shortly thereafter she began having severe right upper quadrant  pain. She returned to the emergency department where she underwent a CT scan  as well as a HIDA scan. These were highly suspicious for a bile leak. On the  morning of July 01, 2005, she was brought back to the operating room  where she underwent a diagnostic laparoscopy. Bile was noted and was  suctioned out. However, no distinct leaking site was noted. A Jackson-Pratt  drain was then placed and gastroenterology was consulted. Dr. Watt Climes  performed an endoscopic retrograde cholangiopancreatogram on July 02, 2005 and placed a stent. This succeeded in greatly decreasing the amount of  bilious drainage through the Jackson-Pratt. The patient slowly recovered and  regained bowel function. Her overall respiratory and physical status  improved as well. On the date of discharge, she is eating a regular diet and  having bowel movements. On July 05, 2005, she did have several episodes  of diarrhea, however, this seems to have stopped today. She had problems  with diarrhea preoperatively. Her pain is being controlled with Vicodin. She  has been educated on the care of her drain.   DISCHARGE INSTRUCTIONS:  1.  Medications - resume all home medications. She is given a prescription      for Vicodin as well as Augmentin 875 mg p.o. b.i.d.  2.  She is to followup with Imogene Burn. Tsuei, M.D. on December 22 for      possible drain removal. She may shower but she will keep a gauze      dressing taped over the drain site. She is to limit her level of      activity to avoid any strenuous activity. She has no significant diet      restrictions but should avoid any real greasy food.      Imogene Burn. Tsuei, M.D.  Electronically Signed     MKT/MEDQ  D:  07/06/2005  T:  07/10/2005  Job:  [id number]   cc:   Jeryl Columbia, M.D.  Fax: [phone]

## 2010-12-03 NOTE — Consult Note (Signed)
[name], DEMBY NO.:  [id number]   MEDICAL RECORD NO.:  [id number]          PATIENT TYPE:  INP   LOCATION:  0159                         FACILITY:  [facility]   PHYSICIAN:  Domingo Madeira, MD   DATE OF BIRTH:  Mar 08, 1946   DATE OF CONSULTATION:  07/01/2005  DATE OF DISCHARGE:                                   CONSULTATION   CONSULTING PHYSICIAN:  Imogene Burn. Tsuei, M.D.   REASON FOR CONSULTATION:  Help with medical management in regards to  hypertension and possible rheumatoid arthritis.   HISTORY OF PRESENT ILLNESS:  [name] is a very pleasant, 65 year old female  who recently had a laparoscopic cholecystectomy performed by Dr. Georgette Dover on  December 12. This went without complications, however, once the patient was  discharged, she became symptomatic with increased abdominal pain and  subsequently returned to the hospital and was found to have a bowel leak.  She underwent an exploratory laparoscopy to see if they could identify a  leak today on the 15th, however, they were unable to. GI has also been  consulted for possible ERCP tomorrow. Currently the patient is complaining  of extensive abdominal pain and shoulder pain, however, her pain seems to be  reasonably controlled on a PCA. I asked the patient in regards to her  arthritis, it appears that there is a question of whether she has rheumatoid  arthritis versus osteoarthritis. Currently she is under the care of Dr.  Charlestine Night. She takes prednisone 5-10 mg daily based on how severe her pain,  when it is severe she takes 10, when it is not as bad she takes 5.   PAST MEDICAL HISTORY:  Significant for hypertension, asthma, spastic colon,  severe osteoarthritis, possible rheumatoid arthritis, hypercholesterolemia,  multiple allergies.   CURRENT MEDICATIONS:  The patient knew the names of her medications but not  the dosage. Based on previous records, she is on:  1.  Verapamil SA 240 mg 1 p.o. daily.  2.   Triamterene/hydrochlorothiazide 75/50, 1 tab daily.  3.  Librax 2-3 tabs p.o. daily p.r.n.  4.  Protonix 1 p.o. daily.  5.  Allegra D 2 tabs daily.  6.  Singulair 10 mg 1 p.o. daily.  7.  Lipitor 10 mg 1 p.o. daily.  8.  Prempro at the lowest dose.  9.  Plaquenil unknown dose.  10. Gemfibrozil unknown dose.   ALLERGIES:  ERYTHROMYCIN.  The patient apparently gets Candidal vaginitis  with any antibiotics.   PAST SURGICAL HISTORY:  Recently laparoscopic cystectomy and exploratory  laparoscopy, tonsillectomy in 1975, D&C in 1978 and 1994. Cesarean section  in 1977, foot fusion recurrent surgery in 1992 and 1999. Right knee  replacement in 2002.   SOCIAL HISTORY:  The patient lives here in [place], is married, has  several grown children, no history of smoking, occasional alcohol, currently  unemployed.   FAMILY HISTORY:  mother alive. History of coronary artery disease,  hypertension. Father passed away from a heart attack. She has a sister with  coronary artery disease.   REVIEW OF SYMPTOMS:  Please see HPI  otherwise all systems were negative.   PHYSICAL EXAMINATION:  VITAL SIGNS:  Temperature of 97.8, blood pressure 135-  150/80, respiratory rate of 20-25, 94% on 3 liters, heart rate of 115 to  120.  HEENT:  Head atraumatic, normocephalic. Eyes 3 mm, pupils reactive to light.  Extraocular movements intact. Ears, nose, no discharge. Throat clear, no  erythema, no exudate. Mucosa moist. The patient has an NG in her left naris.  CARDIOVASCULAR:  Regular rate and rhythm. No murmurs, rubs or gallops heard.  LUNGS:  Clear to auscultation bilateral although it was difficult since the  patient could not take deep breaths.  ABDOMEN:  The patient had several areas which were covered up secondary to  recent surgery. She was tender to touch, positive distention.  EXTREMITIES:  No edema, cyanosis or clubbing.  NEUROLOGIC:  The patient was nonfocal.  GU:  Deferred.   LABORATORY DATA:   White count of 18, hemoglobin 14, hematocrit 42, platelets  at 339. Sodium 137, potassium of 3.0, chloride at 100, CO2 29, glucose at  104, BUN at 14, creatinine at 1. Amylase at 65.   ASSESSMENT:  [name] is a 65 year old female who recently underwent a  laparoscopic cholecystectomy with possible bile leak.   1.  Hypertension. The patient is currently n.p.o.  When she is able to, I      would restart her antihypertensives medications. In the meantime, since      she was on a calcium channel blocker, an alternative would be IV      diltiazem drip. In addition you can try hydrochlorothiazide or      intermittent use.  2.  Osteoarthritis. Possible rheumatoid arthritis. The patient is followed      by Dr. Charlestine Night it is unclear whether she actually has rheumatoid      arthritis. She states that he is treating her as a possible rheumatoid      arthritis including prednisone alternating between 5 and 10 based on how      much pain she is in and also Plaquenil. This will need to be taken into      consideration especially if she has any episodes of hypotension.      However, at this dose, I do not suspect she would have any adrenal      suppression.  3.  Hyperlipidemia. Currently she is n.p.o.  Will hold off any of her      medications.  4.  Asthma.  Would continue with her Advair 500/50 inhaled b.i.d.  Also      would consider Xopenex 0.63 nebulizers q.6 h p.r.n. especially since she      is tachycardic.      Domingo Madeira, MD  Electronically Signed     AEJ/MEDQ  D:  07/01/2005  T:  07/04/2005  Job:  [id number]

## 2010-12-03 NOTE — Op Note (Signed)
NAMERAYLA, PEMBER NO.:  [id number]   MEDICAL RECORD NO.:  [id number]          PATIENT TYPE:  AMB   LOCATION:  [place]                           FACILITY:  [facility]   PHYSICIAN:  Daleen Bo. Gaetano Net, M.D. DATE OF BIRTH:  Oct 17, 1945   DATE OF PROCEDURE:  10/05/2005  DATE OF DISCHARGE:                                 OPERATIVE REPORT   PREOPERATIVE DIAGNOSIS:  Postmenopausal bleeding.   POSTOPERATIVE DIAGNOSIS:  Postmenopausal bleeding.   PROCEDURE:  1.  Hysteroscopy with dilatation curettage.  2.  1% Xylocaine paracervical block.   SURGEON:  Everlene Farrier, M.D.   ANESTHESIA:  MAC.   SPECIMENS:  Endometrial curettings.   ESTIMATED BLOOD LOSS:  Scant.   I&O SORBITOL DISTENDING MEDIA:  40 mL deficit.   INDICATIONS:  This patient is a 65 year old postmenopausal white female with  postmenopausal bleeding. Details dictated in history and physical.  Hysteroscopy, dilatation curettage has been discussed preoperatively.  Potential risks and complications were discussed preoperatively including  but not limited to infection, uterine perforation, organ damage, bleeding  requiring transfusion of blood products with possible transfusion reaction,  HIV, and hepatitis acquisition, DVT, PE, pneumonia. All questions were  answered and consent signed on the chart.   FINDINGS:  Endometrial cavity is normal in appearance. Fallopian tube ostia  are seen bilaterally.   PROCEDURE:  The patient is taken to operating room where she is identified,  placed in dorsosupine position and given anesthesia with LMA. She is then  placed in dorsal lithotomy position where she is prepped, bladder straight  catheterized and draped in sterile fashion. Bivalve speculum was placed in  vagina and anterior cervical lip was injected with 1% Xylocaine and grasped  with single-tooth tenaculum. Paracervical block was placed to 2, 4, 5, 7, 8,  and 10 o'clock positions with approximately 20 mL total of 1%  plain  Xylocaine. Cervix was gently progressively dilated. Diagnostic hysteroscope  was placed in endocervical canal and advanced under direct visualization  using  sorbitol distending media. The above findings noted. Hysteroscope was  withdrawn. Sharp curettage is carried out for scant tissue. Instruments were  removed. Good hemostasis was noted. The procedure is terminated. The patient  is awakened, taken to recovery room in stable condition. All counts correct.      Daleen Bo Gaetano Net, M.D.  Electronically Signed     JET/MEDQ  D:  10/05/2005  T:  10/06/2005  Job:  [id number]

## 2010-12-03 NOTE — H&P (Signed)
. [facility]  Patient:    [name], [name] Visit Number: [id number] MRN: [id number]          Service Type: Attending:  Lara Mulch, M.D. Dictated by:   Evert Kohl, P.A. Adm. Date:  05/07/01                           History and Physical  DATE OF BIRTH:  October 05, 1945  CHIEF COMPLAINT:  Right knee pain for many years.  HISTORY OF PRESENT ILLNESS:  The patient is a 65 year old white female who states that she started having knee pain, probably 16 years ago with progressively worsening pain over the years.  The patient states that she has had multiple cortisone injections with good relief of her symptoms up until the last three or four years, when the injections have become less and less effective.  The patient states the pain has significantly gotten worse.  It is primarily located over the lateral aspect of the knee.  It does radiate up into the hip and down into the ankle.  She describes the pain as a constant deep aching sensation, which worsens with weight-bearing activities.  It improves with rest and being off her feet.  She does have hip clicking, popping, catching, and swelling in the knee.  She denies any injuries specifically to the knee.  No night pain.  She is not currently using a cane to assist ambulation.  DRUG ALLERGIES:  ERYTHROMYCIN.  CURRENT MEDICATIONS:  1. Lipitor 10 mg p.o. q.h.s.  2. Triamterene/hydrochlorothiazide 75/50 one half tablet q.d.  3. Verapamil SA 240 mg q.d.  4. Allegra D p.o. b.i.d.  5. Accolate 20 mg b.i.d.  6. Femhrt 1/5 tablet once a day.  7. Vioxx 50 mg tablet one half tablet q.a.m.  8. Advair 250 mcg/50 mcg two times a day.  9. Flonase nasal spray 50 mcg two sprays each nostril once a day. 10. Fosamax 75 mg q.w. 11. OxyContin 10 mg q.12h. 12. Multiple vitamins.  MEDICAL HISTORY:  1. Hypercholesterolemia.  2. Hypertension.  3. Asthma.  4. Multiple environmental and seasonal  allergies.  The patient denies any specific diabetes, thyroid disease, hiatal hernia, peptic ulcer disease, heart disease.  PREVIOUS SURGICAL HISTORY:  1. C-section, 1977.  2. Tonsillectomy, 1975.  3. Foot spurs in 1991.  4. Left foot mid foot bone fusion, 2000.  5. D&C in 1997.  The patient denies any complications with any of the above-mentioned surgical procedures.  SOCIAL HISTORY:  The patient is a 65 year old white female, denies any smoking.  She does have an occasional alcoholic beverage.  She is currently married.  She does have two children.  She lives in a two story house, six steps to the main entrance.  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  FAMILY PHYSICIAN:  [place], at [phone].  FAMILY MEDICAL HISTORY:  Mother is alive at 43 years of age.  She has had a recent three vessel bypass and she does have hypertension.  Father is deceased at 63 years of age from a massive heart attack.  The patient does have one sister alive, with a recent three-stent cardiac history.  REVIEW OF SYSTEMS:  Positive for occasional glasses for reading.  She does have occasional diarrhea from a spastic colon for which she uses over-the- counter medications.  Otherwise review of systems is negative and noncontributory for  any other general, sensory, respiratory, cardiac, gastrointestinal, genitourinary, hematologic, musculoskeletal, neurologic, mental status problems at this time.  PHYSICAL EXAMINATION:  VITAL SIGNS:  Height is 5 feet 3 inches, weight is 157 pounds, blood pressure is 150/88, temperature is 98.6, pulse 96 and regular, respirations 12.  GENERAL:  This is a healthy-appearing, well-developed adult female.  She does ambulate with a slight right-sided limp, and she does appear to have some valgus deformity of her right lower extremity.  She is able to get on and off the high exam table without any difficulty or  assistance.  HEENT:  Head is normocephalic, atraumatic, nontender over her maxillary frontal sinuses.  Pupils are equal, round, and reactive, accommodating to light.  Extraocular movements are intact.  Pupils are slightly exophthalmic. External ears without deformities.  Canals patent, TMs pearly gray and intact. Gross hearing is intact.  Nasal septum is midline.  Mucous membranes are pink and moist.  No polyps noted.  Oral buccal mucosa is pink and moist.  Dentition is in good repair.  Uvula is midline.  The patient is able to swallow without any difficulty.  NECK:  Supple.  No palpable lymphadenopathy.  Thyroid gland was nontender. The patient had good range of motion of her cervical spine without any difficulty or tenderness.  CHEST:  Lung sounds were clear and equal bilaterally.  No wheezes, rales, rhonchi, or rubs noted.  HEART:  Regular rate and rhythm.  S1 and S2 was auscultated.  No murmurs, rubs, or gallops noted.  ABDOMEN:  Slightly round, soft, nontender to deep palpation.  No hepatosplenomegaly palpable.  Bowel sounds were normal, active throughout. CVA was nontender to percussion.  EXTREMITIES:  Upper extremities were symmetrically sized and shaped with good range of motion of the shoulders, elbows, and wrists without any loss of range of motion or muscles.  Motor strength was 5/5.  Lower extremities:  Hips:  The patient had full extension and flexion up to 120 degrees.  She did have a mechanical click with internal/external rotation, but no specific complaints. Right knee had about a 12 degree valgus deformity.  She was tender along the lateral and posterolateral aspect.  There was no effusion, no sign of erythema or ecchymosis.  She had full extension and flexion back to about 115 degrees. There was about a 10 degree valgus varus laxity.  Left knee:  Full extension and flexion back to 120 degrees.  No significant valgus varus laxity.  No joint line tenderness.  No  effusion.  No signs of erythema or ecchymosis.  Bilateral calves were nontender.  The patient had good dorsi and plantar flexion, bilateral ankles.  Peripheral vasculature:  Carotid pulses 2+, no bruits.  Radial pulses 2+.  The dorsalis pedis and posterior tibial pulses were 2+.  No lower extremity edema or venous stasis changes noted.  NEUROLOGIC:  The patient was conscious, alert, and appropriate.  She held an easy conversation with the examiner.  Cranial nerves 2-12 were grossly intact. Deep tendon reflexes of the upper and lower extremities were symmetrical, right to left, in the upper and lower extremities.  BREASTS/RECTAL/GENITOURINARY:  Deferred.  IMPRESSION: 1. End-stage osteoarthritis, right knee, with valgus deformity. 2. Hypercholesterolemia. 3. Hypertension. 4. Asthma. 5. Multiple environmental and seasonal allergies.  PLAN:  The patient will be admitted to [facility] under the care of Dr. Lara Mulch on May 07, 2001.  The patient will undergo all routines labs and tests prior to having a right total knee arthroplasty. Dictated by:  Evert Kohl, P.A. Attending:  Lara Mulch, M.D. DD:  04/30/01 TD:  04/30/01 Job: [id number]

## 2010-12-03 NOTE — Discharge Summary (Signed)
NAME:  [name], Hailey                          ACCOUNT NO.:  [id number]   MEDICAL RECORD NO.:  [id number]                   PATIENT TYPE:  INP   LOCATION:  5031                                 FACILITY:  [facility]   PHYSICIAN:  Estill Bamberg. Ronnie Derby, M.D.              DATE OF BIRTH:  May 12, 1946   DATE OF ADMISSION:  08/25/2003  DATE OF DISCHARGE:  08/29/2003                                 DISCHARGE SUMMARY   ADMITTING DIAGNOSES:  1. Severe osteoarthritis of knee.  2. Recent flare-up of asthma.  3. Hypertension.  4. Hypercholesterolemia.  5. Multiple environmental and seasonal allergies.  6. Spastic colon.  7. History of right total knee arthroplasty, 2002.   DISCHARGE DIAGNOSES:  1. Status post left total knee arthroplasty.  2. Hypokalemia.  3. Hypertension.  4. Hypercholesterolemia.  5. Multiple environmental and seasonal allergies.  6. Spastic colon.   HISTORY OF PRESENT ILLNESS:  Ms. [name] is a 65 year old white female with  left leg pain starting approximately in 1999.  At that time, the patient was  in a short leg cast due to the foot fusion.  Since that time, the patient  has had progressively worsening left knee pain.  Currently, she described  the left knee pain as a severe, deep, dull aching sensation with occasional  sharp shooting pain whenever she performs awkward movements.  The patient  denies any popping or grinding within the knee.  The pain does radiate up  from the knee into the hip.  She does have significant flank pain.  The  patient walks with a significant limp due to the pain and has lost the  ability to do certain daily activities.  X-rays of the left hip showed  flattening of the femoral condyle medially with near bone on bone.  Also  seen were osteophytes along the lateral femoral condyle.  Patellofemoral  osteoarthritis with large osteophytes was also seen.  Medial compartment  showed near bone on bone, therefore, the patient was admitted to [facility] on August 25, 2003 to undergo a left total knee arthroplasty.   ALLERGIES:  1. ERYTHROMYCIN.  2. Significant candidal vaginitis with ANTIBIOTICS.   CURRENT MEDICATIONS:  1. Verapamil SA 240 mg p.o. daily.  2. Triamterene/hydrochlorothiazide 75/50 mg 1/2 tab p.o. daily.  3. Librax 2-3 tablets p.o. daily. p.r.n.  4. Prilosec OTC 20 mg 1 p.o. daily.  5. Allegra-D 2 tabs daily.  6. Accolate 20 mg 2 tabs p.o. nightly.  7. Lipitor 10 mg 1 p.o. daily.  8. Bextra 20 mg p.o. daily.  9. Femhrt 115 mcg 1 tab p.o. daily.  10.      Advair Diskus 250/50 mcg 2 puffs daily.  11.      Flonase spray 500 mcg 2 sprays each naris daily.   SURGICAL PROCEDURE:  The patient was taken to the operating room by Dr.  Estill Bamberg. Lucey, assisted by Kellie Shropshire  E. Duffy, P.A.-C on August 25, 2003.  The patient was placed under general anesthesia and left total knee  arthroplasty was performed.  The following components were placed:  Size 3  tibia and size D femur, size 10-mm polyethylene spacer and 32-mm patella.  The patient tolerated the procedure well and returned to recovery in good  and stable condition.  Estimated blood loss was 300 mL.   CONSULTS:  The following consults were obtained while the patient was  hospitalized:  1. PT.  2. Case management.   HOSPITAL COURSE:  The patient developed acute blood loss anemia secondary to  surgery, however, remained asymptomatic throughout hospital stay.  Hemoglobin and hematocrit on discharge 11.1/32.3.  The patient also  developed hypokalemia and was treated.  Otherwise, the patient remained  afebrile throughout hospital stay.  Vital signs remained stable.  The  patient progressed slowly with physical therapy and was discharged home in  good and stable condition on postop 4.   LABORATORIES:  CBC on admission:  White blood count 9.0, hemoglobin 15.2,  hematocrit 44.0, platelets 401,000.  Coagulations on admission:  PT 12.7,  INR 0.9, PTT 29.  Routine  chemistries on admission:  Sodium 140, potassium  4.0, chloride 105, bicarb 25, glucose 124, BUN 14, creatinine was 0.9.  At  time of discharge, potassium was 3.1.  Hepatic enzymes on admission:  AST  52, ALT 42, ALP 62, total bilirubin was 1.2.  Urinalysis dated August 20, 2003 showed moderate leukocyte esterase, many epithelial cells, white blood  cells 7-10 and bacteria too numerous to count.  Repeat UA showed leukocyte  esterase small, epithelial cells many, white blood cells 3-6, and bacteria  rare.  Urine culture performed on August 26, 2003 showed colony count to be  3000 colonies per milliliter.   EKG performed on August 25, 2003 shows sinus tachycardia with heart rate of  109 beats per minute, P-R interval 140 msec.  PRT axis 64/-16/63.   Lower extremity Doppler performed on August 27, 2003 showed no obvious  evidence of DVT, SVT or Baker's cysts bilaterally.   DISCHARGE INSTRUCTIONS:   MEDICATIONS:  The patient was to resume medications and add the following:  1. K-Dur 20 mEq 1 tablet daily x7 days.  2. OxyContin 10 mg 1 tab q.6-12 h. p.r.n. pain.  3. Percocet 1-2 tablets every 4-6 hours to be used for breakthrough pain.  4. Lovenox 40 mg 1 injection daily for a total of 11 days.  5. Ativan 0.5 mg 1 tab q.6-8 h. p.r.n. anxiety.  6. Robaxin 500 mg 1 tab up to 4 times a day for muscle spasm.   ACTIVITY:  Activity:  The patient is weightbearing as tolerated with a  walker.  Home health PT -- [facility].   WOUND CARE:  The patient is to change dressing daily.  May shower after 2  days if no drainage.  The patient is to call our office at [phone] if she  develops any of the following:  Temperature greater than 101.5, chills,  swelling, foul-smelling drainage or pain not controlled with pain  medications.   SPECIAL INSTRUCTIONS:  Left leg in CPM 0-90 degree 6-8 hours a day, increase  by 10 degrees daily.  FOLLOWUP:  The patient needs to follow up with Dr. Ronnie Derby in  approximately 10  days from discharge.  The patient is to call our office at [phone] to make  an appointment.  She also needs followup with her primary care physician to  check her potassium and hemoglobin in approximately 2-3 weeks.   CONDITION ON DISCHARGE:  The patient was discharged home in good stable  condition.      Erskine Emery, P.A.                       Estill Bamberg. Ronnie Derby, M.D.    GC/MEDQ  D:  09/30/2003  T:  10/03/2003  Job:  [id number]   cc:   Claris Gower, M.D.  [address]  [place], [place] [address]  Fax: [phone]

## 2010-12-03 NOTE — Assessment & Plan Note (Signed)
[facility]                             PULMONARY OFFICE NOTE   BABARA, BUFFALO                       MRN:          [id number]  DATE:06/20/2006                            DOB:          12/25/1945    PROBLEM:  1. Allergic rhinitis.  2. Asthmatic bronchitis.  3. Esophageal reflux.  4. Rheumatoid arthritis/chronic prednisone.   HISTORY:  She reports some nasal congestion and worries that she may be  catching a cold.  She is caring for a young granddaughter, who has been  sick with a febrile illness, probably viral.  She continues maintenance  prednisone.   MEDICATION:  1. Advair 500/50 b.i.d.  2. Flonase.  3. Allegra D.  4. Singulair.  5. Prednisone 5 mg daily.  6. Lipitor 0.5 times 10 mg.  7. Triamterene/HCTZ 75/50.  8. Verapamil 240 mg.  9. Librax.  10.Xanax 0.5 mg.  11.Protonix.  12.Gemfibrozil 600 mg b.i.d.  13.Potassium.  14.Vitamin D.  15.Plaquenil.  16.Albuterol inhaler.  17.Robinul.  18.Tussionex.  19.Darvocet.  20.Tylenol for arthritis.   DRUG INTOLERANCE:  ERYTHROMYCIN with diarrhea.   OBJECTIVE:  Weight 184 pounds, BP 134/82, pulse regular 97, room air  saturation 94%.  There is quite a bit of white mucus in her nose.  The  pharynx is not red.  She is talkative, without unlabored breathing,  wheeze or cough.  Heart sounds are regular, without murmur.   IMPRESSION:  1. Mild rhinitis.  This may be an early viral URI.  We discussed      comfort management and supportive care.  2. Asthma.   PLAN:  1. Continue careful reflux precautions.  2. Supportive care, emphasizing rest and fluids currently.  3. Saline lavage two to three times daily when needed.  4. Schedule return in four months, earlier p.r.n.     Clinton D. Annamaria Boots, MD, [place], [place]  Electronically Signed    CDY/MedQ  DD: 06/20/2006  DT: 06/21/2006  Job #: [id number]   cc:   Daleen Bo. Gaetano Net, M.D.  Lindaann Slough, M.D.  Claris Gower, M.D.

## 2010-12-03 NOTE — H&P (Signed)
NAME:  [name], [name]                          ACCOUNT NO.:  [id number]   MEDICAL RECORD NO.:  [id number]                   PATIENT TYPE:  INP   LOCATION:  NA                                   FACILITY:  [facility]   PHYSICIAN:  Estill Bamberg. Ronnie Derby, M.D.              DATE OF BIRTH:  30-Dec-1945   DATE OF ADMISSION:  08/25/2003  DATE OF DISCHARGE:                                HISTORY & PHYSICAL   CHIEF COMPLAINT:  Left leg pain.   HISTORY OF PRESENT ILLNESS:  The patient is a 65 year old white female with  left leg pain starting about 1999 when she was in a short leg cast due to a  foot fusion. Since that time, the patient has been having progressively  worsening left knee pain. Currently it is severe. It is a deep, dull aching  sensation with occasional sharp shooting pains with awkward movements. She  does note popping, grinding within the knee. The patient does radiate up  from the knee into the hip. She does have significant night pain. She does  have to walk with a significant limp due to the pain and has lost ability to  do certain activities of daily living due to pain.   X-rays:  AP view shows flattening of the femoral condyle medially with near  bone on bone. She has large osteophytes along the lateral femoral condyle.  She has significant patellofemoral OA with large osteophytes. The medial  compartment is near bone on bone.   ALLERGIES:  1. ERYTHROMYCIN.  2. The patient has significant candidal vaginitis with any ANTIBIOTICS.   CURRENT MEDICATIONS:  1. Verapamil SA 240 mg p.o. q.d.  2. Triamterene/hydrochlorothiazide 75/50 one half tablet p.o. q.d.  3. Librax two or three tablets p.o. q.d. p.r.n.  4. Prilosec OTC 20 mg one tablet p.o. q.d.  5. Allegra D two tablets q.d.  6. Accolate 20 mg two tablets p.o. q.h.s.  7. Lipitor 10 mg one p.o. q.d.  8. Bextra 20 mg p.o. q.d.  9. FemHRT 115 mcg one tablet p.o. q.d.  10.      Advair Diskus 250/50 two puffs q.d.  11.       Flonase nasal spray 50 mcg two sprays each naris q.d.   CURRENT MEDICAL HISTORY:  1. Hypertension.  2. Asthma with a recent flare up due to upper respiratory cold in December,     currently fairly asymptomatic.  3. Spastic colon.   PAST SURGICAL HISTORY:  1. Tonsillectomy in 1975.  2. D&C in [place].  3. Cesarean section in 1977.  4. Foot fusion and recurrent surgery in 1992 and 1999.  5. Right knee replacement 2002.  6. The patient states the only complications with previous surgical     procedures is significant nausea and vomiting with anesthesia and     postoperative constipation.   SOCIAL HISTORY:  The patient is a healthy appearing  65 year old white  female. Denies any history of smoking. She does have occasional alcoholic  beverage. She is married. She has several grown children. She lives with her  husband in a two-story house. She is currently unemployed.   FAMILY HISTORY:  Dr. Claris Gower.   PULMONOLOGIST:  Dr. Turner Daniels.   FAMILY HISTORY:  Mother is alive. She does have a history of three-vessel  bypass and hypertension. Father is deceased from a massive heart attack. She  has one sister alive with some coronary artery disease with stents.   REVIEW OF SYSTEMS:  Positive for upper respiratory cold and cough in  December with a flare up of her asthma. Currently is much improved with no  recurrent symptoms. She does wear glasses. She does have shortness of breath  with exertion. She relates to her poor conditioning her asthma. She does  have problems with constipation and diarrhea due to her spastic colon.  Otherwise, all other review of system categories are noncontributory at this  time.   PHYSICAL EXAMINATION:  VITAL SIGNS:  Height is 5 foot 3, weight is 175  pounds, pulse of 88 and regular. Blood pressure is 138/72, respirations 12.  The patient is afebrile.  GENERAL:  This is a healthy appearing, slightly heavyset, white female. She  does walk with  a significant left sided limp. She is required to get herself  onto the exam tablet with weight on her right leg which is previous total  knee arthroplasty. She appears to be in no acute distress at this time.  HEENT:  Head is normocephalic. Pupils are slightly exophthalmic. They are  equal, round, and reactive. Extraocular movements intact. Sclerae is  nonicteric. Conjunctivae is pink and moist. External ears without  deformities. Canals patent. TMs pearly gray and intact. Gross hearing is  intact. Oral buccal mucosa was pink and moist without lesions. The patient  is able to swallow without any difficulty. Septum appears to be midline.  NECK:  Supple. No palpable lymphadenopathy. Thyroid region was nontender.  She had good range of motion of cervical spine without any difficulty or  tenderness.  EXTREMITIES:  Upper extremities were symmetrically sized and shaped. She had  fairly good range of motion of her shoulders, elbows, and wrists. Motor  strength was 5/5. Lower extremities:  Right and left hip had full extension,  full flexion up to 120 degrees with 20 degrees internal and external  rotation with only some slight soreness over the lateral trochanteric region  on the left, not on the right. No mechanical symptoms. Right knee had a well  healed midline surgical incision. No erythema or ecchymosis. No palpable  effusion. No joint instability. She has full extension and flexion back to  115 degrees. Left knee was knobby appearing. No signs of erythema or  ecchymosis. No palpable effusion. She was tender along the medial and  lateral joint line. More medially. She had about full extension, flexion  back to about 95 degrees. No instability with medial lateral stressing. She  had some crepitus under the patella with range of motion. The calves were  nontender. Ankles were symmetrical with good dorsi/plantar flexion. PERIPHERAL VASCULAR:  Carotid pulses were 2+, no bruits. Radial pulses  2+.  Dorsalis pedis pulses were 2+. She had no lower extremity or venous stasis  changes.  NEUROLOGICAL:  The patient was conscious, alert, and appropriate. Held an  easy conversation with the examiner. Cranial nerves II-XII were grossly  intact. The patient was grossly intact  to light sensation from head to toe.  There were no gross neurologic defects noted.  BREAST, RECTAL, AND GENITOURINARY EXAMS:  Deferred at this time.   IMPRESSION:  1. Severe osteoarthritis, left knee.  2. Recent flare up of asthma.  3. Hypertension.  4. Hypercholesterolemia.  5. Multiple environmental and seasonal allergies.  6. Spastic colon.   PLAN:  The patient will undergo all routine labs and tests prior to having a  left total knee arthroplasty by Dr. Ronnie Derby at [facility] on August 25, 2003.      Evert Kohl, P.A.                      Estill Bamberg. Ronnie Derby, M.D.    RWK/MEDQ  D:  08/19/2003  T:  08/19/2003  Job:  [id number]

## 2010-12-03 NOTE — Procedures (Signed)
[facility]  Patient:    [name], [name] Visit Number: [id number] MRN: [id number]          Service Type: DSU Location: [id number] Attending Physician:  Lara Mulch Dictated by:   Arnette Schaumann. Lutricia Feil, M.D. Proc. Date: 05/07/01 Admit Date:  05/07/2001   CC:         Lara Mulch, M.D.   Procedure Report  PROCEDURE PERFORMED:  ANESTHESIOLOGIST:  Arnette Schaumann. Lutricia Feil, M.D.  INDICATIONS FOR PROCEDURE:  The patient is a 65 year old white female with a diagnosis of severe degenerative joint disease at the knee scheduled for total knee replacement and for whom a femoral nerve block as part of the postoperative pain management has been requested, understood and accepted preoperatively.  DESCRIPTION OF PROCEDURE:  At the termination of surgery while still under general anesthesia, the patients right groin was prepped and draped in the usual sterile fashion.  Using an electrical nerve stimulator and 80 cm insulated needle, the innervation of the right femoral nerve bundle was isolated and identified by stimulation down to 0.5 mv.  This was followed by the slow intermittent injection of a total of 40 cc of a mixture of 0.5% lidocaine and 0.25% Marcaine with 1:400,000 epinephrine.  There were no complications.  The needle was then removed and the patient allowed to awaken in the usual fashion.Dictated by:   Arnette Schaumann. Lutricia Feil, M.D. Attending Physician:  Lara Mulch DD:  05/07/01 TD:  05/08/01 Job: [id number]

## 2010-12-03 NOTE — Op Note (Signed)
[place]  Patient:    [name], [name] Visit Number: [id number] MRN: [id number]          Service Type: DSU Location: [place] 01 Attending Physician:  Lara Mulch Dictated by:   Lara Mulch, M.D. Proc. Date: 05/07/01 Admit Date:  05/07/2001                             Operative Report  PREOPERATIVE DIAGNOSIS:  Right knee osteoarthritis.  POSTOPERATIVE DIAGNOSIS:  Right knee osteoarthritis.  OPERATION PERFORMED:  Right total knee replacement.  SURGEON:  Lara Mulch, M.D.  ASSISTANT:  Zenaida Deed, P.A.  ANESTHESIA:  General.  INDICATIONS FOR PROCEDURE:  The patient is a 65 year old white female with osteoarthritis and failure of conservative treatment.  Informed consent was obtained.  DESCRIPTION OF PROCEDURE:  The patient was laid supine and administered general endotracheal anesthesia and a Foley catheter placed.  The right lower extremity was prepped and draped in the usual sterile fashion.  Esmarch was used to exsanguinate the extremity and the tourniquet inflated to 300 mmHg.  A straight midline incision was made with a #10 blade approximately 15 cm in length. A fresh blade was used to make a median parapatellar arthrotomy.  When everted, the patella measured at 21 mm.  I used the 32 mm reamer to ream down to 12 mm, then used the rongeur ____________ clean the cut.  Once these were removed, we used the 32 mm template paddle and drove the lug holes.  We placed the patellar trial component in place and measured it to 21 mm as well.  We then kept the patella everted, developed a subperiosteal sleeve on the medial ____________ of the tibia and the knee retractor in place went up into flexion.  Then we sized the ACL and PCL so we could easily sublux the tibia anteriorly.  We used the tibial tower and made a tibial cut curving over to the anatomic axis of the tibia taking 2 mm of bone off the medial side.  We then turned our  attention to the femur and made an intramedullary hole, placed our intramedullary guide on 6 degree valgus ____________ on the distal aspect of the femur and then pinned our distal femoral cutting block in place.  We then removed the intramedullary guide and made our distal femoral cut.  We then used the sizer, chose the size D, pinned to the 3 degree external rotation holes.  We then used a 4-1 cutter to make our anterior posterior and chamfer cuts.  We then checked with a 10 mm spacer block to ensure we had equal flexion and extension gaps which we did.  At this point we finished our femur with a finishing guide, our tibia with the tibial guide and then trialed with a size D femur, size 4 tibial tray and a 10 mm insert and a 32 mm patella.  We had excellent stability and excellent range of motion.  We did have a little bit of patellar lift off and we did a small minilateral release. We then removed the components and irrigated copiously. We then cemented in the patella first, the femur second, tibia third and placed the real polyethylene in place.  Once the cement was hardened, we let the tourniquet down, cauterized bleeding vessels, placed a medium Hemovac deep to the wound coming out superolaterally.  We then closed the arthrotomy with interrupted #1 Vicryl sutures, deep soft  tissues with interrupted 0 Vicryl suture, the running subcuticular with 2-0 Vicryl and then skin staples placed in 100 degrees of flexion.  A dressing of Adaptic, 4 x 4s, dressing sponges, ABDs, one layer of Webril and a TED hose.  The patient tolerated the procedure well.  COMPLICATIONS:  None.  DRAINS:  One Hemovac.  TOURNIQUET TIME:  One hour. Dictated by:   Lara Mulch, M.D. Attending Physician:  Lara Mulch DD:  05/07/01 TD:  05/08/01 Job: [id number]

## 2010-12-21 ENCOUNTER — Other Ambulatory Visit: Payer: Self-pay | Admitting: Internal Medicine

## 2010-12-31 ENCOUNTER — Telehealth: Payer: Self-pay | Admitting: Internal Medicine

## 2010-12-31 MED ORDER — MONTELUKAST SODIUM 10 MG PO TABS: 10.0000 mg | ORAL_TABLET | Freq: Every day | ORAL | Status: DC

## 2010-12-31 NOTE — Telephone Encounter (Signed)
rx sent to pharmacy.  Pt aware.

## 2010-12-31 NOTE — Telephone Encounter (Signed)
Per CY-okay to refill Singulair 10 mg #30 take 1 po qd with prn refills.

## 2010-12-31 NOTE — Telephone Encounter (Signed)
Dr Annamaria Boots, this pt was last seen by you in 2008- has pending appt for f/u on 02/03/11, and wants to know if she can have refill on singulair. Last note however, does not indicate that she takes this med. Please advise if okay thanks!

## 2011-01-14 ENCOUNTER — Other Ambulatory Visit: Payer: Self-pay | Admitting: Gastroenterology

## 2011-02-02 ENCOUNTER — Encounter: Payer: Self-pay | Admitting: Internal Medicine

## 2011-02-03 ENCOUNTER — Encounter: Payer: Self-pay | Admitting: Internal Medicine

## 2011-02-03 ENCOUNTER — Ambulatory Visit (INDEPENDENT_AMBULATORY_CARE_PROVIDER_SITE_OTHER): Payer: BC Managed Care – PPO | Admitting: Internal Medicine

## 2011-02-03 VITALS — BP 120/72 | HR 87 | Ht 62.0 in | Wt 161.2 lb

## 2011-02-03 DIAGNOSIS — J45909 Unspecified asthma, uncomplicated: Secondary | ICD-10-CM

## 2011-02-03 DIAGNOSIS — J309 Allergic rhinitis, unspecified: Secondary | ICD-10-CM

## 2011-02-03 NOTE — Patient Instructions (Signed)
Continue present meds and allergy vaccine. Please call as needed.

## 2011-02-03 NOTE — Progress Notes (Signed)
Subjective:    [name], female    DOB: Jan 20, 1946, 65 y.o.   MRN: [id number]  HPI 02/03/11- 64 yoF followed for allergic rhinitis, rhinosinusitis, asthmatic bronchitis, complicated by , GERD, rheumatoid arthritis. Last here August, 3, 2011 She has has done much better since last here- maybe because she is past the turmoil of moving last year. Not needing her nebulizer recently. Rescue inhaler used less than once/ week. Talked about the death of her aged mother.  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   Review of Systems Constitutional:   No-   weight loss, night sweats, fevers, chills, fatigue, lassitude. HEENT:   No-   headaches, difficulty swallowing, tooth/dental problems, sore throat,                  Little-  sneezing, itching, ear ache, nasal congestion, post nasal drip,   CV:  No-   chest pain, orthopnea, PND, swelling in lower extremities, anasarca,  dizziness, palpitations  GI:  No-   heartburn, indigestion, abdominal pain, nausea, vomiting, diarrhea,                 change in bowel habits, loss of appetite  Resp: No-   shortness of breath with exertion or at rest.  No-  excess mucus,             No-   productive cough,  No non-productive cough,  No-  coughing up of blood.              No-   change in color of mucus.  No- wheezing.    Skin: No-   rash or lesions.  GU: No-   dysuria, change in color of urine, no urgency or frequency.  No- flank pain.  MS:  No- back pain.  Psych:  No- change in mood or affect. No acute depression or anxiety.  No memory loss.      Objective:   Physical Exam General- Alert, Oriented, Affect-appropriate, Distress- none acute Skin- rash-none, lesions- none, excoriation- none Lymphadenopathy- none Head- atraumatic            Eyes- Gross vision intact, PERRLA, conjunctivae clear secretions            Ears- Hearing, canals- normal            Nose- Clear, No- Septal dev, mucus,  polyps, erosion, perforation             Throat- Mallampati II , mucosa clear , drainage- none, tonsils- atrophic Neck- flexible , trachea midline, no stridor , thyroid nl, carotid no bruit Chest - symmetrical excursion , unlabored           Heart/CV- RRR , no murmur , no gallop  , no rub, nl s1 s2                           - JVD- none , edema- none, stasis changes- none, varices- none           Lung- clear to P&A, wheeze- none, cough- none , dullness-none, rub- none           Chest wall-  Abd- tender-no, distended-no, bowel sounds-present, HSM- no Br/ Gen/ Rectal- Not done, not indicated Extrem- cyanosis- none, clubbing, none, atrophy- none, strength- nl.  Surgical scars at joints/ feet. Neuro- grossly intact to observation  Assessment & Plan:

## 2011-02-03 NOTE — Assessment & Plan Note (Addendum)
She has been much more stable since move from former home. Not clear how much was related to the stress of moving, but she is happier.  Continue allergy vacine

## 2011-02-03 NOTE — Assessment & Plan Note (Addendum)
Good control. She expects to spend more time sitting for grandchildren. We have discussed viral exposure/ infections as an asthma risk.

## 2011-02-23 ENCOUNTER — Ambulatory Visit (INDEPENDENT_AMBULATORY_CARE_PROVIDER_SITE_OTHER): Payer: BC Managed Care – PPO

## 2011-02-23 DIAGNOSIS — J309 Allergic rhinitis, unspecified: Secondary | ICD-10-CM

## 2011-03-11 ENCOUNTER — Telehealth: Payer: Self-pay | Admitting: Internal Medicine

## 2011-03-11 MED ORDER — AZITHROMYCIN 250 MG PO TABS: ORAL_TABLET | ORAL | Status: AC

## 2011-03-11 NOTE — Telephone Encounter (Signed)
rx sent. Pt aware.[place] Bing, CMA

## 2011-03-11 NOTE — Telephone Encounter (Signed)
Suggest Z pak

## 2011-03-11 NOTE — Telephone Encounter (Signed)
Spoke with the pt and she is c/o nasal congestion and draingae that is yellow x 2 days. She denies cough, chest congestion or fever.  She is concerned because she has cataract surgery set for Monday and she does not want to be sick for this and is requesting an abx. Please advise. [place] Bing, CMA No Known Allergies

## 2011-03-30 ENCOUNTER — Telehealth: Payer: Self-pay | Admitting: Internal Medicine

## 2011-03-30 MED ORDER — CLOTRIMAZOLE 10 MG MT TROC: OROMUCOSAL | Status: DC

## 2011-03-30 NOTE — Telephone Encounter (Signed)
Last filled on 02/24/2011. RX written on 11/02/2010 for # 60 2/ 4 refills. Last OV on 02/03/2011. Pls advise on sending refills.

## 2011-03-30 NOTE — Telephone Encounter (Signed)
Ok to refill x 6

## 2011-05-02 LAB — POCT HEMOGLOBIN-HEMACUE
Hemoglobin: 12.8
Operator id: 123881

## 2011-05-02 LAB — BASIC METABOLIC PANEL
BUN: 18
CO2: 30
Calcium: 10.1
Chloride: 96
Creatinine, Ser: 0.8
GFR calc Af Amer: >60
GFR calc non Af Amer: >60
Glucose, Bld: 117 — ABNORMAL HIGH
Potassium: 3.4 — ABNORMAL LOW
Sodium: 134 — ABNORMAL LOW

## 2011-06-13 ENCOUNTER — Telehealth: Payer: Self-pay | Admitting: Internal Medicine

## 2011-06-13 MED ORDER — AMOXICILLIN-POT CLAVULANATE 875-125 MG PO TABS: 1.0000 | ORAL_TABLET | Freq: Two times a day (BID) | ORAL | Status: AC

## 2011-06-13 MED ORDER — HYDROCOD POLST-CHLORPHEN POLST 10-8 MG/5ML PO LQCR: ORAL | Status: AC

## 2011-06-13 NOTE — Telephone Encounter (Signed)
Per CY: OK for Augmentin 890m 1 po bid # 14 x 0 and ok to refill last Tussionex script

## 2011-06-13 NOTE — Telephone Encounter (Signed)
I spoke with pt and she c/o sore throat, coughing at night (not getting anything up), low grade fever (not sure what it is), and sinus drainage x 3 days. Pt states she has been around her grandson and he has strep throat. Pt also would like a refill on her tussionex. Pt is requesting recs from Dr. Annamaria Boots. Please advise, thanks  No Known Allergies

## 2011-06-13 NOTE — Telephone Encounter (Signed)
Pt is aware of cdy recs. rx's have been called into pharmacy

## 2011-06-24 ENCOUNTER — Ambulatory Visit (INDEPENDENT_AMBULATORY_CARE_PROVIDER_SITE_OTHER): Payer: BC Managed Care – PPO

## 2011-06-24 DIAGNOSIS — J309 Allergic rhinitis, unspecified: Secondary | ICD-10-CM

## 2011-10-17 ENCOUNTER — Telehealth: Payer: Self-pay | Admitting: Internal Medicine

## 2011-10-17 MED ORDER — PROMETHAZINE-CODEINE 6.25-10 MG/5ML PO SYRP: 5.0000 mL | ORAL_SOLUTION | Freq: Four times a day (QID) | ORAL | Status: AC | PRN

## 2011-10-17 MED ORDER — AZITHROMYCIN 250 MG PO TABS: ORAL_TABLET | ORAL | Status: AC

## 2011-10-17 NOTE — Telephone Encounter (Signed)
I spoke with pt and she c/o sore throat, runny nose, PND, cough w/ yellow to green phlem w/ occasional specks of blood in it, and sweats x 4 days. Pt not sure if she has a fever or not. Denies any chills, body aches, nausea, vomiting. Pt also would like a "good cough syrup" called in for her as well. Please advise Dr. Annamaria Boots, thanks  No Known Allergies

## 2011-10-17 NOTE — Telephone Encounter (Signed)
Per CY-okay to give patient Phenergan codeine cough syrup # 200 ml 1 tsp every 6 hours prn cough no refills and Zpak #1 take as directed no refills.

## 2011-10-17 NOTE — Telephone Encounter (Signed)
Spoke with pt and notified of recs per CDY. She verbalized understanding and denied any questions. Rxs were called to her pharm.

## 2011-11-09 ENCOUNTER — Ambulatory Visit (INDEPENDENT_AMBULATORY_CARE_PROVIDER_SITE_OTHER): Payer: Medicare Other

## 2011-11-09 DIAGNOSIS — J309 Allergic rhinitis, unspecified: Secondary | ICD-10-CM

## 2011-11-10 ENCOUNTER — Other Ambulatory Visit: Payer: Self-pay | Admitting: Internal Medicine

## 2011-11-10 NOTE — Telephone Encounter (Signed)
Ok to refill 

## 2011-11-10 NOTE — Telephone Encounter (Signed)
Please advise if okay to refill.

## 2011-12-10 ENCOUNTER — Other Ambulatory Visit: Payer: Self-pay | Admitting: Internal Medicine

## 2012-01-30 ENCOUNTER — Ambulatory Visit: Payer: BC Managed Care – PPO | Admitting: Internal Medicine

## 2012-02-06 ENCOUNTER — Ambulatory Visit (INDEPENDENT_AMBULATORY_CARE_PROVIDER_SITE_OTHER): Payer: Medicare Other | Admitting: Internal Medicine

## 2012-02-06 ENCOUNTER — Encounter: Payer: Self-pay | Admitting: Internal Medicine

## 2012-02-06 VITALS — BP 130/80 | HR 90 | Ht 62.0 in | Wt 182.0 lb

## 2012-02-06 DIAGNOSIS — J45998 Other asthma: Secondary | ICD-10-CM

## 2012-02-06 DIAGNOSIS — J45909 Unspecified asthma, uncomplicated: Secondary | ICD-10-CM

## 2012-02-06 DIAGNOSIS — J302 Other seasonal allergic rhinitis: Secondary | ICD-10-CM

## 2012-02-06 DIAGNOSIS — J3089 Other allergic rhinitis: Secondary | ICD-10-CM

## 2012-02-06 DIAGNOSIS — J309 Allergic rhinitis, unspecified: Secondary | ICD-10-CM

## 2012-02-06 MED ORDER — IPRATROPIUM BROMIDE 0.03 % NA SOLN: NASAL | Status: DC

## 2012-02-06 MED ORDER — EPINEPHRINE 0.3 MG/0.3ML IJ DEVI: 0.3000 mg | Freq: Once | INTRAMUSCULAR | Status: AC

## 2012-02-06 MED ORDER — CLOTRIMAZOLE 10 MG MT TROC: OROMUCOSAL | Status: DC

## 2012-02-06 NOTE — Patient Instructions (Addendum)
Scripts refilled for Epipen and for chlotrimazole troches- sent  Script to try ipratropium nasal spray before meals when needed for runny nose  Try elevating the head of your bed frame with a brick under the head legs to reduce night time cough

## 2012-02-06 NOTE — Progress Notes (Signed)
Subjective:    [name], female    DOB: Feb 27, 1946, 67 y.o.   MRN: [id number]  HPI 02/03/11- 64 yoF followed for allergic rhinitis, rhinosinusitis, asthmatic bronchitis, complicated by , GERD, rheumatoid arthritis. Last here August, 3, 2011 She has has done much better since last here- maybe because she is past the turmoil of moving last year. Not needing her nebulizer recently. Rescue inhaler used less than once/ week. Talked about the death of her aged mother.  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   02/06/12- 64 yoF followed for allergic rhinitis, rhinosinusitis, asthmatic bronchitis, complicated by , GERD, rheumatoid vaccine   Follows for: takes vaccine and has some sinus drainage when she eats.  She has responded a couple of x2 a Z-Pak when needed. Overall much better since she moved to her new home 2 years ago. Uses her rescue inhaler at bedtime. Less nighttime cough she lies on her side. 5 grandchildren stay with her, which is a stress and the source of colds. Watery rhinorrhea when she is.Troches have worked well for yeast prn.   ROS-see HPI Constitutional:   No-   weight loss, night sweats, fevers, chills, fatigue, lassitude. HEENT:   No-  headaches, difficulty swallowing, tooth/dental problems, sore throat,       No-  sneezing, itching, ear ache, nasal congestion, +post nasal drip,  CV:  No-   chest pain, orthopnea, PND, swelling in lower extremities, anasarca,  dizziness, palpitations Resp: No-   shortness of breath with exertion or at rest.              No-   productive cough,  + non-productive cough,  No- coughing up of blood.              No-   change in color of mucus.  No- wheezing.   Skin: No-   rash or lesions. GI:  No-   heartburn, indigestion, abdominal pain, nausea, vomiting,  GU:  MS:  No- acute  joint pain or swelling.   Neuro-     nothing unusual Psych:  No- change in mood or affect. No depression or  anxiety.  No memory loss.  Objective:   Physical Exam General- Alert, Oriented, Affect-appropriate, Distress- none acute Skin- rash-none, lesions- none, excoriation- none Lymphadenopathy- none Head- atraumatic            Eyes- Gross vision intact, PERRLA, conjunctivae clear secretions            Ears- Hearing, canals- normal            Nose- Clear, No- Septal dev, mucus, polyps, erosion, perforation             Throat- Mallampati II , mucosa clear , drainage- none, tonsils- atrophic Neck- flexible , trachea midline, no stridor , thyroid nl, carotid no bruit Chest - symmetrical excursion , unlabored           Heart/CV- RRR , no murmur , no gallop  , no rub, nl s1 s2                           - JVD- none , edema- none, stasis changes- none, varices- none           Lung- clear to P&A, wheeze- none, cough- none , dullness-none, rub- none           Chest wall-  Abd- tender-no,  distended-no, bowel sounds-present, HSM- no Br/ Gen/ Rectal- Not done, not indicated Extrem- cyanosis- none, clubbing, none, atrophy- none, strength- nl.  Surgical scars at joints/ feet. Neuro- grossly intact to observation  Assessment & Plan:

## 2012-02-10 NOTE — Assessment & Plan Note (Signed)
Some nighttime cough. We discussed post nasal drip, asthma and reflux as possible causes. Plan-elevate head of bed for sleep. Medications reviewed.

## 2012-02-10 NOTE — Assessment & Plan Note (Signed)
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 Plan-continue allergy vaccine. Try ipratropium nasal spray before meals when needed

## 2012-03-08 ENCOUNTER — Other Ambulatory Visit: Payer: Self-pay | Admitting: Internal Medicine

## 2012-03-27 ENCOUNTER — Ambulatory Visit (INDEPENDENT_AMBULATORY_CARE_PROVIDER_SITE_OTHER): Payer: Medicare Other

## 2012-03-27 DIAGNOSIS — J309 Allergic rhinitis, unspecified: Secondary | ICD-10-CM

## 2012-03-28 ENCOUNTER — Ambulatory Visit (INDEPENDENT_AMBULATORY_CARE_PROVIDER_SITE_OTHER): Payer: Medicare Other | Admitting: Adult Health

## 2012-03-28 ENCOUNTER — Encounter: Payer: Self-pay | Admitting: Adult Health

## 2012-03-28 ENCOUNTER — Telehealth: Payer: Self-pay | Admitting: Internal Medicine

## 2012-03-28 VITALS — BP 140/88 | HR 113 | Temp 97.2°F | Ht 62.25 in | Wt 181.8 lb

## 2012-03-28 DIAGNOSIS — J209 Acute bronchitis, unspecified: Secondary | ICD-10-CM

## 2012-03-28 MED ORDER — HYDROCODONE-HOMATROPINE 5-1.5 MG/5ML PO SYRP: 5.0000 mL | ORAL_SOLUTION | Freq: Four times a day (QID) | ORAL | Status: AC | PRN

## 2012-03-28 MED ORDER — AMOXICILLIN-POT CLAVULANATE 875-125 MG PO TABS: 1.0000 | ORAL_TABLET | Freq: Two times a day (BID) | ORAL | Status: AC

## 2012-03-28 MED ORDER — LEVALBUTEROL HCL 0.63 MG/3ML IN NEBU
0.6300 mg | INHALATION_SOLUTION | Freq: Once | RESPIRATORY_TRACT | Status: AC
  Administered 2012-03-28: 0.63 mg via RESPIRATORY_TRACT

## 2012-03-28 MED ORDER — PREDNISONE 10 MG PO TABS: ORAL_TABLET | ORAL | Status: DC

## 2012-03-28 NOTE — Patient Instructions (Addendum)
Augmentin 884m Twice daily  For 7 days w/ food  Mucinex DM Twice daily  As needed  Cough/congestion  Prednisone taper over next week.  Fluids and rest  Hydromet 1-2 tsp every 4 hr as needed  Please contact office for sooner follow up if symptoms do not improve or worsen or seek emergency care

## 2012-03-28 NOTE — Addendum Note (Signed)
Addended by: Parke Poisson E on: 03/28/2012 04:18 PM   Modules accepted: Orders

## 2012-03-28 NOTE — Telephone Encounter (Signed)
I spoke with [name] and she c/o cough w/ yellow-green phlem, wheezing, PND, sore throat x 5 days now. No f/c/s/n/v. She has been taking mucinex/robitussin. Per Joellen Jersey bring [name] in to see TP this AM at 11:00 for an evaluation. I spoke with [name] and she was fine with coming in and seeing TP.

## 2012-03-28 NOTE — Addendum Note (Signed)
Addended by: Parke Poisson E on: 03/28/2012 12:13 PM   Modules accepted: Orders

## 2012-03-28 NOTE — Assessment & Plan Note (Signed)
Flare  xopenex neb in office   Plan  Augmentin 820m Twice daily  For 7 days w/ food  Mucinex DM Twice daily  As needed  Cough/congestion  Prednisone taper over next week.  Fluids and rest  Hydromet 1-2 tsp every 4 hr as needed  Please contact office for sooner follow up if symptoms do not improve or worsen or seek emergency care

## 2012-03-28 NOTE — Progress Notes (Signed)
  Subjective:    [name], female    DOB: Nov 13, 1945, 66 y.o.   MRN: [id number]  HPI 02/03/11- 49 yoF never smoker followed for allergic rhinitis, rhinosinusitis, asthmatic bronchitis, complicated by , GERD, rheumatoid arthritis. Last here August, 3, 2011 She has has done much better since last here- maybe because she is past the turmoil of moving last year. Not needing her nebulizer recently. Rescue inhaler used less than once/ week. Talked about the death of her aged mother.  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   02/06/12- 64 yoF followed for allergic rhinitis, rhinosinusitis, asthmatic bronchitis, complicated by , GERD, rheumatoid vaccine   Follows for: takes vaccine and has some sinus drainage when she eats.  She has responded a couple of x2 a Z-Pak when needed. Overall much better since she moved to her new home 2 years ago. Uses her rescue inhaler at bedtime. Less nighttime cough she lies on her side. 5 grandchildren stay with her, which is a stress and the source of colds. Watery rhinorrhea when she is.Troches have worked well for yeast prn.   03/28/2012 Acute OV  Complains of prod cough with w/ green/yellow/white mucus, wheezing, increased SOB, chills x2.5weeks, worse x1day.  OTC not helping .  Cough is keeping her up at night.  No hemoptysis or chest pian .  Has a Hx of Chrons Dz and OA.  Never smoker.    ROS-see HPI  Constitutional:   No  weight loss, night sweats,  Fevers, chills, fatigue, or  lassitude.  HEENT:   No headaches,  Difficulty swallowing,  Tooth/dental problems, or  Sore throat,                No sneezing, itching, ear ache,  +nasal congestion, post nasal drip,   CV:  No chest pain,  Orthopnea, PND, swelling in lower extremities, anasarca, dizziness, palpitations, syncope.   GI  No heartburn, indigestion, abdominal pain, nausea, vomiting, diarrhea, change in bowel habits, loss of appetite, bloody  stools.   Resp:    No coughing up of blood.    No chest wall deformity  Skin: no rash or lesions.  GU: no dysuria, change in color of urine, no urgency or frequency.  No flank pain, no hematuria   MS:  No joint pain or swelling.  No decreased range of motion.  No back pain.  Psych:  No change in mood or affect. No depression or anxiety.  No memory loss.       Objective:   Physical Exam GEN: A/Ox3; pleasant , NAD, obese   HEENT:  [place]/AT,  EACs-clear, TMs-wnl, NOSE-clear, THROAT-clear, no lesions, no postnasal drip or exudate noted.   NECK:  Supple w/ fair ROM; no JVD; normal carotid impulses w/o bruits; no thyromegaly or nodules palpated; no lymphadenopathy.  RESP  Coarse BS w/ faint exp wheeze no accessory muscle use, no dullness to percussion  CARD:  RRR, no m/r/g  , no peripheral edema, pulses intact, no cyanosis or clubbing.  GI:   Soft & nt; nml bowel sounds; no organomegaly or masses detected.  Musco: Warm bil, no deformities or joint swelling noted.   Neuro: alert, no focal deficits noted.    Skin: Warm, no lesions or rashes, bruising along upper buttock (fall few days ago)    Assessment & Plan:

## 2012-04-03 ENCOUNTER — Telehealth: Payer: Self-pay | Admitting: Internal Medicine

## 2012-04-03 MED ORDER — HYDROCOD POLST-CHLORPHEN POLST 10-8 MG/5ML PO LQCR: 5.0000 mL | Freq: Two times a day (BID) | ORAL | Status: DC | PRN

## 2012-04-03 MED ORDER — PREDNISONE 10 MG PO TABS: ORAL_TABLET | ORAL | Status: DC

## 2012-04-03 NOTE — Telephone Encounter (Signed)
Rx was refilled. Spoke with pt and notified will refill tussionex. She verbalized understanding and rx was sent to pharm.

## 2012-04-03 NOTE — Telephone Encounter (Signed)
Per CY-okay to give refill on Tussionex.

## 2012-04-03 NOTE — Telephone Encounter (Signed)
Patient is aware of Prednisone taper but also would like to have a refill for Tussinex to help with her cough. She says the Hydromet given by TP does not work as well and she continues to cough a lot. Pls advise on refilling Tussinex.

## 2012-04-03 NOTE — Telephone Encounter (Signed)
Per CY-okay to give Prednisone 10 mg #20 take 4 x 2 days, 3 x 2 days, 2 x 2 days, 1 x days, then stop, no refills.

## 2012-04-03 NOTE — Telephone Encounter (Signed)
Pt saw TP on 03/28/12: Patient Instructions     Augmentin 881m Twice daily For 7 days w/ food  Mucinex DM Twice daily As needed Cough/congestion  Prednisone taper over next week.  Fluids and rest  Hydromet 1-2 tsp every 4 hr as needed  Please contact office for sooner follow up if symptoms do not improve or worsen or seek emergency care    I spoke with pt and she stated she has 1 day left of the Augmentin and is still not better. C/o constant cough-barley gets any phlem up at all, chest congestion, and wheezing. Stated she can't sleep at night and is keeping her husband up as well. Please advise Dr. YAnnamaria Bootsthanks  Allergies  Allergen Reactions  . Doxycycline     Diarrhea   . Erythromycin     Diarrhea   . Tetanus Toxoids     Shaking uncontrollable

## 2012-04-27 ENCOUNTER — Telehealth: Payer: Self-pay | Admitting: Internal Medicine

## 2012-04-27 MED ORDER — AMOXICILLIN-POT CLAVULANATE 875-125 MG PO TABS: 1.0000 | ORAL_TABLET | Freq: Two times a day (BID) | ORAL | Status: AC

## 2012-04-27 NOTE — Telephone Encounter (Signed)
Spoke with pt and notified of recs per CDY Pt verbalized understanding and states nothing further needed Rx was sent to pharm

## 2012-04-27 NOTE — Telephone Encounter (Signed)
Last OV on 03-28-12 with TP, next OV 02/05/13. At last ov pt was having productive cough with yellow phlegm, wheezing, increased SOB. Pt was given augmentin and pred taper. Pt states she did feel an improvement in her symptoms but never felt like she was back to normal. She states x 5 days ago the symptoms began to get worse again. Pt states now she has all of the above symptoms as well as a sore throat. Pt also states she has noticed some pink tinge in the mucus she is coughing up over the last week. Please advise. Bing, CMA   Allergies  Allergen Reactions  . Doxycycline     Diarrhea   . Erythromycin     Diarrhea   . Tetanus Toxoids     Shaking uncontrollable

## 2012-04-27 NOTE — Telephone Encounter (Signed)
Recommend try augmentin longer, since it seemed to be helping last time. Augmenting 875 mg, # 20, 1 twice daily x 10 days.

## 2012-04-27 NOTE — Telephone Encounter (Signed)
Pt calling again in ref to previous msg can be reached at [phone].[name]

## 2012-05-10 ENCOUNTER — Other Ambulatory Visit: Payer: Self-pay | Admitting: Neurological Surgery

## 2012-05-10 DIAGNOSIS — M25552 Pain in left hip: Secondary | ICD-10-CM

## 2012-05-10 DIAGNOSIS — M549 Dorsalgia, unspecified: Secondary | ICD-10-CM

## 2012-05-10 DIAGNOSIS — M25559 Pain in unspecified hip: Secondary | ICD-10-CM

## 2012-05-18 ENCOUNTER — Ambulatory Visit
Admission: RE | Admit: 2012-05-18 | Discharge: 2012-05-18 | Disposition: A | Discharge status: Home / Self Care | Payer: Medicare Other | Source: Ambulatory Visit | Attending: Neurological Surgery | Admitting: Neurological Surgery

## 2012-05-18 ENCOUNTER — Inpatient Hospital Stay
Admission: RE | Admit: 2012-05-18 | Discharge: 2012-05-18 | Payer: Medicare Other | Source: Ambulatory Visit | Attending: Neurological Surgery | Admitting: Neurological Surgery

## 2012-05-18 DIAGNOSIS — M25559 Pain in unspecified hip: Secondary | ICD-10-CM

## 2012-05-18 DIAGNOSIS — M25552 Pain in left hip: Secondary | ICD-10-CM

## 2012-05-18 DIAGNOSIS — M549 Dorsalgia, unspecified: Secondary | ICD-10-CM

## 2012-05-18 MED ORDER — GADOBENATE DIMEGLUMINE 529 MG/ML IV SOLN
15.0000 mL | Freq: Once | INTRAVENOUS | Status: AC | PRN
  Administered 2012-05-18: 15 mL via INTRAVENOUS

## 2012-06-04 ENCOUNTER — Other Ambulatory Visit: Payer: Self-pay | Admitting: *Deleted

## 2012-06-04 MED ORDER — MONTELUKAST SODIUM 10 MG PO TABS: 10.0000 mg | ORAL_TABLET | Freq: Every day | ORAL | Status: DC

## 2012-07-09 ENCOUNTER — Ambulatory Visit (INDEPENDENT_AMBULATORY_CARE_PROVIDER_SITE_OTHER): Payer: Medicare Other

## 2012-07-09 DIAGNOSIS — J309 Allergic rhinitis, unspecified: Secondary | ICD-10-CM

## 2012-07-30 ENCOUNTER — Other Ambulatory Visit: Payer: Self-pay | Admitting: Neurological Surgery

## 2012-08-23 ENCOUNTER — Other Ambulatory Visit: Payer: Self-pay | Admitting: Neurological Surgery

## 2012-09-10 ENCOUNTER — Other Ambulatory Visit: Payer: Self-pay | Admitting: Family Medicine

## 2012-09-10 ENCOUNTER — Ambulatory Visit
Admission: RE | Admit: 2012-09-10 | Discharge: 2012-09-10 | Disposition: A | Discharge status: Home / Self Care | Payer: Medicare Other | Source: Ambulatory Visit | Attending: Family Medicine | Admitting: Family Medicine

## 2012-09-10 DIAGNOSIS — R0600 Dyspnea, unspecified: Secondary | ICD-10-CM

## 2012-09-10 DIAGNOSIS — R0609 Other forms of dyspnea: Secondary | ICD-10-CM

## 2012-09-12 ENCOUNTER — Telehealth: Payer: Self-pay | Admitting: Internal Medicine

## 2012-09-12 MED ORDER — AMOXICILLIN-POT CLAVULANATE 875-125 MG PO TABS: 1.0000 | ORAL_TABLET | Freq: Two times a day (BID) | ORAL | Status: DC

## 2012-09-12 MED ORDER — HYDROCOD POLST-CHLORPHEN POLST 10-8 MG/5ML PO LQCR: 5.0000 mL | Freq: Two times a day (BID) | ORAL | Status: DC | PRN

## 2012-09-12 NOTE — Telephone Encounter (Signed)
Called, spoke with pt.  C/o sore throat off and on, PND, increased SOB with activities, wheezing, and coughing qhs.  Cough is prod with yellow to green mucus.  Symptoms started about 2 wks ago.  Also reports a low grade fever. "feeling miserable," states she has been feeling dizzy but was told yesterday by PCP that her ears are fine.  Offered OV tomorrow with CDY but pt doesn't want to wait this long.  She is ok with rxs being called in and would like rx for tussionex.  Dr. Annamaria Boots, pls advise.  Thank you.  Was seen by TP on 03/28/12 Was seen by CDY on 02/06/12  [facility]  Allergies verified with pt: Allergies  Allergen Reactions  . Doxycycline Other (See Comments)    Diarrhea   . Erythromycin Other (See Comments)    Diarrhea   . Tetanus Toxoids Other (See Comments)    Shaking uncontrollable

## 2012-09-12 NOTE — Telephone Encounter (Signed)
Per CY-okay to give Augmentin 875 mg #14 take 1 po bid no refills; Tussionex #257m 1 tsp every 12 hours prn cough no refills.

## 2012-09-12 NOTE — Telephone Encounter (Signed)
rx sent. Pt is aware.[name], CMA

## 2012-09-20 ENCOUNTER — Ambulatory Visit (INDEPENDENT_AMBULATORY_CARE_PROVIDER_SITE_OTHER): Payer: Medicare Other | Admitting: Internal Medicine

## 2012-09-20 ENCOUNTER — Encounter: Payer: Self-pay | Admitting: Internal Medicine

## 2012-09-20 VITALS — BP 122/78 | HR 97 | Ht 62.25 in | Wt 179.8 lb

## 2012-09-20 DIAGNOSIS — J40 Bronchitis, not specified as acute or chronic: Secondary | ICD-10-CM

## 2012-09-20 MED ORDER — CEFDINIR 300 MG PO CAPS: 300.0000 mg | ORAL_CAPSULE | Freq: Two times a day (BID) | ORAL | Status: DC

## 2012-09-20 NOTE — Patient Instructions (Addendum)
Script for cefdinir sent to drug store  Try using your nebulizer twice daily for a few days- see if it helps your breathing

## 2012-09-20 NOTE — Progress Notes (Signed)
Subjective:    [name], female    DOB: 10-31-1945, 67 y.o.   MRN: [id number]  HPI 02/03/11- 89 yoF never smoker followed for allergic rhinitis, rhinosinusitis, asthmatic bronchitis, complicated by , GERD, rheumatoid arthritis. Last here August, 3, 2011 She has has done much better since last here- maybe because she is past the turmoil of moving last year. Not needing her nebulizer recently. Rescue inhaler used less than once/ week. Talked about the death of her aged mother.  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   02/06/12- 64 yoF followed for allergic rhinitis, rhinosinusitis, asthmatic bronchitis, complicated by , GERD, rheumatoid arthritis   Follows for: takes vaccine and has some sinus drainage when she eats.  She has responded a couple of x2 a Z-Pak when needed. Overall much better since she moved to her new home 2 years ago. Uses her rescue inhaler at bedtime. Less nighttime cough she lies on her side. 5 grandchildren stay with her, which is a stress and the source of colds. Watery rhinorrhea when she is.Troches have worked well for yeast prn.   03/28/2012 Acute OV  Complains of prod cough with w/ green/yellow/white mucus, wheezing, increased SOB, chills x2.5weeks, worse x1day.  OTC not helping .  Cough is keeping her up at night.  No hemoptysis or chest pian .  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  Never smoker.   09/20/12- 66 yoF followed for allergic rhinitis, rhinosinusitis, asthmatic bronchitis, complicated by , GERD/ Crohn's, rheumatoid arthritis  Husband here. FOLLOWS FOR: still having headaches, sinus drainage, and dizzy. Was given Augmentin and cough syrup last week. Had fallen in shower in August 2013-had a hematoma and no broken bones. Now- increased SOB/ DOE x 3 weeks. Dry cough at night. Chronic globus. Mucus plug from nose.  CXR 09/10/12-  IMPRESSION:  No acute findings.  Original Report Authenticated By: Lorin Picket, M.D.  ROS-see HPI Constitutional:   No-   weight loss, night sweats, fevers, chills, fatigue, lassitude. HEENT:   No-  headaches, difficulty swallowing, tooth/dental problems, +sore throat,       No-  sneezing, itching, ear ache, +nasal congestion, post nasal drip,  CV:  No-   chest pain, orthopnea, PND, swelling in lower extremities, anasarca,                                  dizziness, palpitations Resp: +shortness of breath with exertion or at rest.              No-   productive cough,  +non-productive cough,  No- coughing up of blood.              No-   change in color of mucus.  No- wheezing.   Skin: No-   rash or lesions. GI:  No-   heartburn, indigestion, abdominal pain, nausea, vomiting,  GU: . MS:  +  joint pain or swelling.   Neuro-     nothing unusual Psych:  No- change in mood or affect. No depression + anxiety.  No memory loss.  Objective:  OBJ- Physical Exam General- Alert, Oriented, Affect-appropriate, Distress- none acute. Truncal obesity Skin- rash-none, lesions- none, excoriation- none Lymphadenopathy- none Head- atraumatic            Eyes- Gross vision intact, PERRLA, conjunctivae and secretions clear  Ears- Hearing, canals-normal            Nose- +turbinate edema, no-Septal dev, mucus, polyps, erosion, perforation             Throat- Mallampati II , mucosa red , drainage- none, tonsils- atrophic Neck- flexible , trachea midline, no stridor , thyroid nl, carotid no bruit Chest - symmetrical excursion , unlabored           Heart/CV- RRR , no murmur , no gallop  , no rub, nl s1 s2                           - JVD- none , edema- none, stasis changes- none, varices- none           Lung- clear to P&A, wheeze- none, cough- none , dullness-none, rub- none           Chest wall-  Abd-  Br/ Gen/ Rectal- Not done, not indicated Extrem- cyanosis- none, clubbing, none, atrophy- none, strength- nl Neuro- grossly intact to observation  Assessment & Plan:

## 2012-09-21 ENCOUNTER — Other Ambulatory Visit: Payer: Self-pay | Admitting: Internal Medicine

## 2012-09-21 NOTE — Assessment & Plan Note (Signed)
Acute URI with bronchitis, complicated by her anxiety. Plan- Cefdinir. Try using her home nebulizer with Duoneb twice daily for the next several days to see if it makes any difference. Fluids and supportive care.

## 2012-09-22 ENCOUNTER — Emergency Department (HOSPITAL_COMMUNITY)
Admission: EM | Admit: 2012-09-22 | Discharge: 2012-09-22 | Disposition: A | Discharge status: Home / Self Care | Payer: Medicare Other | Attending: Emergency Medicine | Admitting: Emergency Medicine

## 2012-09-22 ENCOUNTER — Emergency Department (HOSPITAL_COMMUNITY): Payer: Medicare Other

## 2012-09-22 ENCOUNTER — Encounter (HOSPITAL_COMMUNITY): Payer: Self-pay | Admitting: Emergency Medicine

## 2012-09-22 DIAGNOSIS — Y9389 Activity, other specified: Secondary | ICD-10-CM | POA: Insufficient documentation

## 2012-09-22 DIAGNOSIS — Y92009 Unspecified place in unspecified non-institutional (private) residence as the place of occurrence of the external cause: Secondary | ICD-10-CM | POA: Insufficient documentation

## 2012-09-22 DIAGNOSIS — Z981 Arthrodesis status: Secondary | ICD-10-CM | POA: Insufficient documentation

## 2012-09-22 DIAGNOSIS — S81009A Unspecified open wound, unspecified knee, initial encounter: Secondary | ICD-10-CM | POA: Insufficient documentation

## 2012-09-22 DIAGNOSIS — S8000XA Contusion of unspecified knee, initial encounter: Secondary | ICD-10-CM | POA: Insufficient documentation

## 2012-09-22 DIAGNOSIS — W19XXXA Unspecified fall, initial encounter: Secondary | ICD-10-CM

## 2012-09-22 DIAGNOSIS — S91009A Unspecified open wound, unspecified ankle, initial encounter: Secondary | ICD-10-CM | POA: Insufficient documentation

## 2012-09-22 DIAGNOSIS — M069 Rheumatoid arthritis, unspecified: Secondary | ICD-10-CM | POA: Insufficient documentation

## 2012-09-22 DIAGNOSIS — K219 Gastro-esophageal reflux disease without esophagitis: Secondary | ICD-10-CM | POA: Insufficient documentation

## 2012-09-22 DIAGNOSIS — M549 Dorsalgia, unspecified: Secondary | ICD-10-CM | POA: Insufficient documentation

## 2012-09-22 DIAGNOSIS — Z9889 Other specified postprocedural states: Secondary | ICD-10-CM | POA: Insufficient documentation

## 2012-09-22 DIAGNOSIS — J45909 Unspecified asthma, uncomplicated: Secondary | ICD-10-CM | POA: Insufficient documentation

## 2012-09-22 DIAGNOSIS — Z79899 Other long term (current) drug therapy: Secondary | ICD-10-CM | POA: Insufficient documentation

## 2012-09-22 DIAGNOSIS — J309 Allergic rhinitis, unspecified: Secondary | ICD-10-CM | POA: Insufficient documentation

## 2012-09-22 DIAGNOSIS — G8929 Other chronic pain: Secondary | ICD-10-CM | POA: Insufficient documentation

## 2012-09-22 DIAGNOSIS — W010XXA Fall on same level from slipping, tripping and stumbling without subsequent striking against object, initial encounter: Secondary | ICD-10-CM | POA: Insufficient documentation

## 2012-09-22 DIAGNOSIS — S81809A Unspecified open wound, unspecified lower leg, initial encounter: Secondary | ICD-10-CM | POA: Insufficient documentation

## 2012-09-22 DIAGNOSIS — Z96659 Presence of unspecified artificial knee joint: Secondary | ICD-10-CM | POA: Insufficient documentation

## 2012-09-22 DIAGNOSIS — IMO0002 Reserved for concepts with insufficient information to code with codable children: Secondary | ICD-10-CM | POA: Insufficient documentation

## 2012-09-22 MED ORDER — HYDROMORPHONE HCL PF 1 MG/ML IJ SOLN
0.5000 mg | Freq: Once | INTRAMUSCULAR | Status: AC
  Administered 2012-09-22: 0.5 mg via INTRAMUSCULAR
  Filled 2012-09-22: qty 1

## 2012-09-22 MED ORDER — LIDOCAINE-EPINEPHRINE-TETRACAINE (LET) SOLUTION
3.0000 mL | Freq: Once | NASAL | Status: AC
  Administered 2012-09-22: 3 mL via TOPICAL
  Filled 2012-09-22: qty 3

## 2012-09-22 MED ORDER — HYDROCODONE-ACETAMINOPHEN 5-325 MG PO TABS: 2.0000 | ORAL_TABLET | ORAL | Status: DC | PRN

## 2012-09-22 NOTE — ED Provider Notes (Signed)
History   This chart was scribed for non-physician practitioner working with Malvin Johns, MD by Hampton Abbot, ED Scribe. This patient was seen in room TR10C/TR10C and the patient's care was started at 7:33 PM.    CSN: [id number]  Arrival date & time 09/22/12  1233   First MD Initiated Contact with Patient 09/22/12 1838      Chief Complaint  Patient presents with  . Fall  . Knee Injury     The history is provided by the patient. No language interpreter was used.  [name] is a 67 y.o. female who presents to the Emergency Department complaining of constant aching sharp left knee pain with sudden onset since falling and landing on the knee last night at 5:00 PM.  Pt reports she did not come to ED yesterday because she was having uncontrollable diarrhea at the time.  Pt used Vicodin at 10:00 AM today that she has for back pain that alleviated the pain until about one hour ago at which time she took one more Vicodin.  Pt is able to ambulate with some increase in pain.  Pt has had left knee arthroplasty.  Pt denies any neck pain, back pain, ankle pain, or hip pain any worse than baseline.  Pt denies hitting her head, loss of consciousness, changes in vision, slurred speech or other neurologic deficit.   Orthopedist Dr. Lorre Nick.   Past Medical History  Diagnosis Date  . Esophageal reflux   . Acute asthmatic bronchitis   . Rheumatoid arthritis   . Allergic rhinitis     Past Surgical History  Procedure Laterality Date  . Ankle fusion  2009    left  . Back surgery      Family History  Problem Relation Age of Onset  . Heart disease Mother   . Arthritis Mother   . Heart attack Father   . Diabetes Other     sibling  . Heart attack Other     sibling    History  Substance Use Topics  . Smoking status: Never Smoker   . Smokeless tobacco: Not on file     Comment: positive passive tobacco smoke exposure  . Alcohol Use: Yes     Comment: glass of wine each night     No OB  history provided.   Review of Systems  HENT: Negative for mouth sores, neck pain and neck stiffness.   Eyes: Negative for visual disturbance.  Respiratory: Negative for cough, chest tightness, shortness of breath and wheezing.   Cardiovascular: Negative for chest pain.  Gastrointestinal: Negative for abdominal pain.  Endocrine: Negative for polydipsia, polyphagia and polyuria.  Genitourinary: Negative for dysuria, urgency, frequency and hematuria.  Musculoskeletal: Positive for back pain (chronic, unchanged).  Skin: Positive for wound. Negative for rash.  Allergic/Immunologic: Negative for immunocompromised state.  Neurological: Negative for syncope, light-headedness and headaches.  Hematological: Does not bruise/bleed easily.  Psychiatric/Behavioral: Negative for sleep disturbance. The patient is not nervous/anxious.   All other systems reviewed and are negative.    Allergies  Lactose intolerance (gi); Other; Tdap; Doxycycline; Erythromycin; and Tetanus toxoids  Home Medications   Current Outpatient Rx  Name  Route  Sig  Dispense  Refill  . albuterol (PROAIR HFA) 108 (90 BASE) MCG/ACT inhaler   Inhalation   Inhale 2 puffs into the lungs every 6 (six) hours as needed for wheezing.          Marland Kitchen ALPRAZolam (XANAX) 1 MG tablet   Oral  Take 1 mg by mouth at bedtime as needed for sleep.         . benzonatate (TESSALON) 100 MG capsule   Oral   Take 100 mg by mouth 4 (four) times daily as needed.           . budesonide (ENTOCORT EC) 3 MG 24 hr capsule   Oral   Take 9 mg by mouth every morning.         . cefdinir (OMNICEF) 300 MG capsule   Oral   Take 300 mg by mouth 2 (two) times daily. Started 09/20/12, for 7 days, ending 09/26/12.         . celecoxib (CELEBREX) 200 MG capsule   Oral   Take 200 mg by mouth daily.          . chlorpheniramine-HYDROcodone (TUSSIONEX) 10-8 MG/5ML LQCR   Oral   Take 5 mLs by mouth every 12 (twelve) hours as needed (cough).          . chlorzoxazone (PARAFON) 500 MG tablet   Oral   Take 500 mg by mouth every 6 (six) hours as needed for muscle spasms.          . clotrimazole (MYCELEX) 10 MG troche   Oral   Take 10 mg by mouth 2 (two) times daily.         . colestipol (COLESTID) 1 G tablet   Oral   Take 1 g by mouth daily.         Marland Kitchen Dextromethorphan-Guaifenesin (ROBITUSSIN DM) 10-100 MG/5ML liquid   Oral   Take 5 mLs by mouth every 12 (twelve) hours.         . ergocalciferol (VITAMIN D2) 50000 UNITS capsule   Oral   Take 50,000 Units by mouth every 14 (fourteen) days.           Marland Kitchen esomeprazole (NEXIUM) 40 MG capsule   Oral   Take 40 mg by mouth every evening.         Marland Kitchen FLUoxetine (PROZAC) 20 MG tablet   Oral   Take 20 mg by mouth daily.           . fluticasone (FLONASE) 50 MCG/ACT nasal spray   Nasal   Place 2 sprays into the nose daily.          . furosemide (LASIX) 20 MG tablet   Oral   Take 2 tablets by mouth daily.          Marland Kitchen glycopyrrolate (ROBINUL) 1 MG tablet   Oral   Take 1 mg by mouth as needed (for IBS).          Marland Kitchen HYDROcodone-acetaminophen (NORCO) 10-325 MG per tablet   Oral   Take by mouth every 6 (six) hours as needed for pain.          Marland Kitchen ipratropium (ATROVENT) 0.03 % nasal spray      1-2 sprays each nostril 3 times daily as needed   30 mL   12   . Menthol, Topical Analgesic, (BLUE-EMU MAXIMUM STRENGTH EX)   Apply externally   Apply 1 application topically daily as needed (for hip, back pain).         . montelukast (SINGULAIR) 10 MG tablet   Oral   Take 1 tablet (10 mg total) by mouth at bedtime.   30 tablet   3   . potassium chloride SA (K-DUR,KLOR-CON) 20 MEQ tablet   Oral   Take 20 mEq by mouth 4 (  four) times daily.           . pravastatin (PRAVACHOL) 40 MG tablet   Oral   Take 40 mg by mouth daily.          . promethazine (PHENERGAN) 25 MG tablet   Oral   Take 25 mg by mouth as needed for nausea.          . traMADol (ULTRAM) 50  MG tablet   Oral   Take 50 mg by mouth every 8 (eight) hours as needed for pain.         Marland Kitchen EPINEPHrine (EPIPEN 2-PAK) 0.3 mg/0.3 mL DEVI   Intramuscular   Inject 0.3 mLs (0.3 mg total) into the muscle once. Take as directed for severe allergic reaction   1 Device   prn   . HYDROcodone-acetaminophen (NORCO/VICODIN) 5-325 MG per tablet   Oral   Take 2 tablets by mouth every 4 (four) hours as needed for pain.   10 tablet   0     BP 150/108  Pulse 98  Temp(Src) 98.2 F (36.8 C) (Oral)  Resp 20  SpO2 96%  Physical Exam  Nursing note and vitals reviewed. Constitutional: She is oriented to person, place, and time. She appears well-developed and well-nourished. No distress.  HENT:  Head: Normocephalic and atraumatic.  Mouth/Throat: Oropharynx is clear and moist.  Eyes: Conjunctivae and EOM are normal. Pupils are equal, round, and reactive to light.  Neck: Normal range of motion. Neck supple.  Cardiovascular: Normal rate, regular rhythm, normal heart sounds and intact distal pulses.  Exam reveals no gallop and no friction rub.   No murmur heard. Intact distal pulses, capillary refill < 3 seconds  Pulmonary/Chest: Effort normal. No respiratory distress. She exhibits no tenderness.  Musculoskeletal: Normal range of motion. She exhibits tenderness.       Left knee: She exhibits ecchymosis and laceration. She exhibits normal range of motion, no swelling and no effusion.  No tenderness in medial or lateral joint spaces.  No tenderness in popliteal fossa, tibia, fibula, or femur.  Full ROM of left ankle, knee, and hip.  Mild L-spine paraspinal tenderness to palpation however this is chronic for the pt and unchanged  Lymphadenopathy:    She has no cervical adenopathy.  Neurological: She is alert and oriented to person, place, and time. No cranial nerve deficit. She exhibits normal muscle tone. Coordination normal.  No sensory deficit  Skin: Skin is warm and dry. No erythema.  10cm  laceration over left knee consistent with skin tear, bruising and ecchymosis around laceration, hemostasis achieved  Psychiatric: She has a normal mood and affect. Her behavior is normal.    ED Course  Procedures (including critical care time) DIAGNOSTIC STUDIES: Oxygen Saturation is 96% on room air, adequate by my interpretation.    COORDINATION OF CARE: 7:44 PM- Informed pt that it has been too long since the initial injury to place regular sutures.  Discussed cleaning to wound and placing loose sutures to help pull skin borders closer together.  Discussed pain medication.  Advised follow-up with Dr. Laureen Ochs.     Dg Knee Complete 4 Views Left  09/22/2012  *RADIOLOGY REPORT*  Clinical Data: Fall, laceration/abrasion to anterior knee  LEFT KNEE - COMPLETE 4+ VIEW  Comparison: None.  Findings: Left total knee arthroplasty.  No fracture or dislocation is seen.  Mild soft tissue irregularity prepatellar region.  No radiopaque foreign body is seen.  IMPRESSION: No fracture, dislocation, or radiopaque foreign body is  seen.  Left total knee arthroplasty.   Original Report Authenticated By: Julian Hy, M.D.      1. Laceration   2. Fall at home, initial encounter       MDM  [name] presents more than 24 hours after laceration to the left knee. Patient with history of left knee arthroplasty. X-ray shows hardware intact, no evidence of dislocation or fracture. Patient's lacerations irrigated extensively. It is unable to be closed secondary to the time of the accident.  Unable to date tenderness as patient is allergic.  This wound care and strongly recommended the patient followup with her primary care physician on Monday for wound recheck. Patient is already taking Omnicef as prescribed by her allergist and I have encouraged her to continue this and complete the course. Patient is to return to the emergency department if she develops fever, chills, nausea, vomiting, erythema around the site  or streaking.  1. Medications: Vicodin, usual home medications 2. Treatment: rest, drink plenty of fluids, keep wound clean and dry 3. Follow Up: Please followup with your primary doctor for discussion of your diagnoses and further evaluation after today's visit and for wound check on Monday   I personally performed the services described in this documentation, which was scribed in my presence. The recorded information has been reviewed and is accurate.        Jarrett Soho Latravious Levitt, PA-C 09/22/12 2147

## 2012-09-22 NOTE — ED Notes (Signed)
Patient slipped last night onto left knee. Laceration  left knee placed bandage and topical antibiotic last night by husband. Pain currently 10/10 achy sharp.

## 2012-09-22 NOTE — ED Notes (Signed)
Called for triage x 1.  No answer.

## 2012-09-22 NOTE — ED Provider Notes (Signed)
Medical screening examination/treatment/procedure(s) were performed by non-physician practitioner and as supervising physician I was immediately available for consultation/collaboration.   Malvin Johns, MD 09/22/12 [phone]

## 2012-11-08 ENCOUNTER — Encounter: Payer: Self-pay | Admitting: Internal Medicine

## 2012-11-08 ENCOUNTER — Ambulatory Visit (INDEPENDENT_AMBULATORY_CARE_PROVIDER_SITE_OTHER): Payer: Medicare Other | Admitting: Internal Medicine

## 2012-11-08 DIAGNOSIS — J3089 Other allergic rhinitis: Secondary | ICD-10-CM

## 2012-11-08 DIAGNOSIS — J309 Allergic rhinitis, unspecified: Secondary | ICD-10-CM

## 2012-11-08 DIAGNOSIS — J40 Bronchitis, not specified as acute or chronic: Secondary | ICD-10-CM

## 2012-11-08 DIAGNOSIS — Z5189 Encounter for other specified aftercare: Secondary | ICD-10-CM

## 2012-11-08 DIAGNOSIS — J302 Other seasonal allergic rhinitis: Secondary | ICD-10-CM

## 2012-11-08 MED ORDER — HYDROCODONE-HOMATROPINE 5-1.5 MG/5ML PO SYRP: 5.0000 mL | ORAL_SOLUTION | Freq: Four times a day (QID) | ORAL | Status: DC | PRN

## 2012-11-08 MED ORDER — AMOXICILLIN-POT CLAVULANATE 875-125 MG PO TABS: 1.0000 | ORAL_TABLET | Freq: Two times a day (BID) | ORAL | Status: DC

## 2012-11-08 MED ORDER — ALBUTEROL SULFATE HFA 108 (90 BASE) MCG/ACT IN AERS: 2.0000 | INHALATION_SPRAY | Freq: Four times a day (QID) | RESPIRATORY_TRACT | Status: DC | PRN

## 2012-11-08 NOTE — Patient Instructions (Addendum)
Scripts sent for rescue inhaler and antibiotic  Script printed for cough syrup  Watch for snoring and stopping breathing while sleeping. These are clues suggesting sleep apnea that would indicate we should do a sleep study.

## 2012-11-08 NOTE — Progress Notes (Signed)
Subjective:    [name], female    DOB: 07/31/1945, 67 y.o.   MRN: [id number]  HPI 02/03/11- 38 yoF never smoker followed for allergic rhinitis, rhinosinusitis, asthmatic bronchitis, complicated by , GERD, rheumatoid arthritis. Last here August, 3, 2011 She has has done much better since last here- maybe because she is past the turmoil of moving last year. Not needing her nebulizer recently. Rescue inhaler used less than once/ week. Talked about the death of her aged mother.  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   02/06/12- 64 yoF followed for allergic rhinitis, rhinosinusitis, asthmatic bronchitis, complicated by , GERD, rheumatoid arthritis   Follows for: takes vaccine and has some sinus drainage when she eats.  She has responded a couple of x2 a Z-Pak when needed. Overall much better since she moved to her new home 2 years ago. Uses her rescue inhaler at bedtime. Less nighttime cough she lies on her side. 5 grandchildren stay with her, which is a stress and the source of colds. Watery rhinorrhea when she is.Troches have worked well for yeast prn.   03/28/2012 Acute OV  Complains of prod cough with w/ green/yellow/white mucus, wheezing, increased SOB, chills x2.5weeks, worse x1day.  OTC not helping .  Cough is keeping her up at night.  No hemoptysis or chest pian .  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  Never smoker.   09/20/12- 66 yoF followed for allergic rhinitis, rhinosinusitis, asthmatic bronchitis, complicated by , GERD/ Crohn's, rheumatoid arthritis  Husband here. FOLLOWS FOR: still having headaches, sinus drainage, and dizzy. Was given Augmentin and cough syrup last week. Had fallen in shower in August 2013-had a hematoma and no broken bones. Now- increased SOB/ DOE x 3 weeks. Dry cough at night. Chronic globus. Mucus plug from nose.  CXR 09/10/12-  IMPRESSION:  No acute findings.  Original Report Authenticated By: Lorin Picket, M.D.  11/08/12- 66 yoF followed for allergic rhinitis, rhinosinusitis, asthmatic bronchitis, complicated by , GERD/ Crohn's, rheumatoid arthritis  Husband here. FOLLOWS JJK:KXFGH on allergy vaccine1:10 GO and doing well; no reactions. Has had flare up with allergies; for about 3 weeks now. Never really cleared completely after exacerbation at last visit when we gave cefdinir.Marland Kitchen Has had increasing cough with scant yellow green sputum starting about a week ago. Denies fever or sore throat. Occasional wheeze. Increased ankle edema. Dr. Arelia Sneddon increased her Lasix. She had fallen on ice during the winter and badly skinned her right pretibial area and left knee. We talked about being seen at a wound center. She is to see a neurologist about her falls. She complains of always feeling very tired and husband says she snores. I educated them on watching for sleep apnea. CXR 09/10/12 IMPRESSION:  No acute findings.  Original Report Authenticated By: Lorin Picket, M.D.  ROS-see HPI Constitutional:   No-   weight loss, night sweats, fevers, chills, fatigue, lassitude. HEENT:   No-  headaches, difficulty swallowing, tooth/dental problems, +sore throat,       No-  sneezing, itching, ear ache, +nasal congestion, post nasal drip,  CV:  No-   chest pain, orthopnea, PND, swelling in lower extremities, anasarca,                                  dizziness, palpitations Resp: +shortness of breath with exertion or at rest.  No-   +productive cough,  +non-productive cough,  No- coughing up of blood.              No-   change in color of mucus.  No- wheezing.   Skin: +slowly healing wounds on lower legs GI:  No-   heartburn, indigestion, abdominal pain, nausea, vomiting,  GU: . MS:  +  joint pain or swelling.   Neuro-     nothing unusual Psych:  No- change in mood or affect. No depression + anxiety.  No memory loss.  Objective:  OBJ- Physical Exam General- Alert, Oriented, Affect-appropriate,  Distress- none acute. Truncal obesity Skin- +eschar on exposed skin wound R lower leg. Dressing on L knee.  Lymphadenopathy- none Head- atraumatic            Eyes- Gross vision intact, PERRLA, conjunctivae and secretions clear            Ears- Hearing, canals-normal            Nose- +turbinate edema, no-Septal dev, mucus, polyps, erosion, perforation             Throat- Mallampati II , mucosa red , drainage- none, tonsils- atrophic Neck- flexible , trachea midline, no stridor , thyroid nl, carotid no bruit Chest - symmetrical excursion , unlabored           Heart/CV- RRR , no murmur , no gallop  , no rub, nl s1 s2                           - JVD- none , edema+1-2 both ankles, stasis changes+ red right upper ankle          area, varices- none           Lung- +raspy cough without wheeze , dullness-none, rub- none           Chest wall-  Abd-  Br/ Gen/ Rectal- Not done, not indicated Extrem- cyanosis- none, clubbing, none, atrophy- none, strength- nl Neuro- grossly intact to observation  Assessment & Plan:

## 2012-11-15 ENCOUNTER — Encounter: Payer: Self-pay | Admitting: Neurology

## 2012-11-15 ENCOUNTER — Ambulatory Visit (INDEPENDENT_AMBULATORY_CARE_PROVIDER_SITE_OTHER): Payer: Medicare Other | Admitting: Neurology

## 2012-11-15 ENCOUNTER — Telehealth: Payer: Self-pay | Admitting: Internal Medicine

## 2012-11-15 VITALS — BP 133/91 | HR 112 | Temp 98.3°F | Ht 63.0 in | Wt 183.0 lb

## 2012-11-15 DIAGNOSIS — T1490XA Injury, unspecified, initial encounter: Secondary | ICD-10-CM

## 2012-11-15 DIAGNOSIS — R51 Headache: Secondary | ICD-10-CM

## 2012-11-15 DIAGNOSIS — R2689 Other abnormalities of gait and mobility: Secondary | ICD-10-CM

## 2012-11-15 DIAGNOSIS — S81809A Unspecified open wound, unspecified lower leg, initial encounter: Secondary | ICD-10-CM | POA: Insufficient documentation

## 2012-11-15 DIAGNOSIS — S91009A Unspecified open wound, unspecified ankle, initial encounter: Secondary | ICD-10-CM | POA: Insufficient documentation

## 2012-11-15 DIAGNOSIS — R269 Unspecified abnormalities of gait and mobility: Secondary | ICD-10-CM

## 2012-11-15 DIAGNOSIS — S81009A Unspecified open wound, unspecified knee, initial encounter: Secondary | ICD-10-CM | POA: Insufficient documentation

## 2012-11-15 DIAGNOSIS — R296 Repeated falls: Secondary | ICD-10-CM

## 2012-11-15 DIAGNOSIS — S81802A Unspecified open wound, left lower leg, initial encounter: Secondary | ICD-10-CM | POA: Insufficient documentation

## 2012-11-15 DIAGNOSIS — R29818 Other symptoms and signs involving the nervous system: Secondary | ICD-10-CM

## 2012-11-15 NOTE — Progress Notes (Signed)
Subjective:    [name] is a 67 y.o. female.  HPI  Star Age, MD, PhD [facility] [address] [place], [place] [address]   Dear Dr. Arelia Sneddon,  I saw your patient, [name], upon your kind request in my neurologic clinic today for initial consultation of her gait disturbance and recurrent falls. The patient is accompanied by her husband today. As you know, [name] is a very pleasant 67 year old right-handed woman with an underlying medical history of allergies, hyperlipidemia, hypertension, vitamin D deficiency, arthritis with left hip pain and status post back surgery in February 2012 who has sustained several falls recently. She injured her right leg in March. She was recently diagnosed with bronchitis and was given [employer]. Her current medications are multiple as listed below. She first fell soon after the back surgery in the parking lot of a store. She has had some fall d/t weather and had a flare up of her IBS, which delayed the medical attention for the L knee injury. She was on chronic steroid therapy in the form of Entocort for about 8 months and just recently tapered off of it.  She had both knees replaced in 2001 and 2004. She fell in March and reopened the area on her L knee and had a new laceration to her R distal leg; these wounds have not healed well and she has worsening swelling of both distal legs and feet. She had multiple surgeries to her left foot with fusion due to severe arthritis. She has gained quite a bit of weight since her steroids were started.  Her Past Medical History Is Significant For: Past Medical History  Diagnosis Date  . Esophageal reflux   . Acute asthmatic bronchitis   . Rheumatoid arthritis   . Allergic rhinitis     Her Past Surgical History Is Significant For: Past Surgical History  Procedure Laterality Date  . Ankle fusion  2009    left  . Back surgery      Her Family  History Is Significant For: Family History  Problem Relation Age of Onset  . Heart disease Mother   . Arthritis Mother   . Heart attack Father   . Diabetes Other     sibling  . Heart attack Other     sibling    Her Social History Is Significant For: History   Social History  . Marital Status: Married    Spouse Name: N/A    Number of Children: 2  . Years of Education: N/A   Social History Main Topics  . Smoking status: Never Smoker   . Smokeless tobacco: None     Comment: positive passive tobacco smoke exposure  . Alcohol Use: Yes     Comment: glass of wine each night   . Drug Use: None  . Sexually Active: None   Other Topics Concern  . None   Social History Narrative   Exercise - at least 2 times weekly   Caffeine - 2 cups in the morning          Her Allergies Are:  Allergies  Allergen Reactions  . Lactose Intolerance (Gi) Diarrhea  . Other Diarrhea    "lettuce only"  . Tdap (Diphth-Acell Pertussis-Tetanus)     Shaking uncontrollably  . Doxycycline Other (See Comments)    Diarrhea   . Erythromycin Other (See Comments)    Diarrhea   . Tetanus Toxoids Other (See Comments)  Shaking uncontrollable    :   Her Current Medications Are:  Outpatient Encounter Prescriptions as of 11/15/2012  Medication Sig Dispense Refill  . albuterol (PROAIR HFA) 108 (90 BASE) MCG/ACT inhaler Inhale 2 puffs into the lungs every 6 (six) hours as needed for wheezing or shortness of breath.  1 Inhaler  prn  . ALPRAZolam (XANAX) 1 MG tablet Take 1 mg by mouth at bedtime as needed for sleep.      Marland Kitchen amoxicillin-clavulanate (AUGMENTIN) 875-125 MG per tablet Take 1 tablet by mouth 2 (two) times daily.  14 tablet  0  . benzonatate (TESSALON) 100 MG capsule Take 100 mg by mouth 4 (four) times daily as needed.       . celecoxib (CELEBREX) 200 MG capsule Take 200 mg by mouth daily.       . chlorpheniramine-HYDROcodone (TUSSIONEX) 10-8 MG/5ML LQCR Take 5 mLs by mouth every 12 (twelve)  hours as needed (cough).      . clotrimazole (MYCELEX) 10 MG troche Take 10 mg by mouth daily.       . colestipol (COLESTID) 1 G tablet Take 1 g by mouth daily.      Marland Kitchen Dextromethorphan-Guaifenesin (ROBITUSSIN DM) 10-100 MG/5ML liquid Take 5 mLs by mouth every 12 (twelve) hours.      Marland Kitchen EPINEPHrine (EPIPEN 2-PAK) 0.3 mg/0.3 mL DEVI Inject 0.3 mLs (0.3 mg total) into the muscle once. Take as directed for severe allergic reaction  1 Device  prn  . ergocalciferol (VITAMIN D2) 50000 UNITS capsule Take 50,000 Units by mouth every 14 (fourteen) days.        Marland Kitchen esomeprazole (NEXIUM) 40 MG capsule Take 40 mg by mouth every evening.      Marland Kitchen FLUoxetine (PROZAC) 20 MG tablet Take 20 mg by mouth daily.        . fluticasone (FLONASE) 50 MCG/ACT nasal spray Place 2 sprays into the nose daily.       . furosemide (LASIX) 20 MG tablet Take 3 tablets by mouth daily.       Marland Kitchen glycopyrrolate (ROBINUL) 1 MG tablet Take 1 mg by mouth as needed (for IBS-can take up to TID if needed).       Marland Kitchen HYDROcodone-acetaminophen (NORCO) 10-325 MG per tablet Take by mouth every 6 (six) hours as needed for pain.       Marland Kitchen ipratropium (ATROVENT) 0.03 % nasal spray 1-2 sprays each nostril 3 times daily as needed  30 mL  12  . Menthol, Topical Analgesic, (BLUE-EMU MAXIMUM STRENGTH EX) Apply 1 application topically daily as needed (for hip, back pain).      . montelukast (SINGULAIR) 10 MG tablet TAKE 1 TABLET (10 MG TOTAL) BY MOUTH AT BEDTIME.  30 tablet  3  . potassium chloride SA (K-DUR,KLOR-CON) 20 MEQ tablet Take 20 mEq by mouth 4 (four) times daily.        . pravastatin (PRAVACHOL) 40 MG tablet Take 40 mg by mouth daily.       . promethazine (PHENERGAN) 25 MG tablet Take 25 mg by mouth as needed for nausea.       . traMADol (ULTRAM) 50 MG tablet Take 50 mg by mouth every 8 (eight) hours as needed for pain.      . budesonide (ENTOCORT EC) 3 MG 24 hr capsule Take 9 mg by mouth every morning.      . chlorzoxazone (PARAFON) 500 MG tablet        . HYDROcodone-homatropine (HYCODAN) 5-1.5 MG/5ML syrup Take 5  mLs by mouth every 6 (six) hours as needed for cough.  240 mL  0   No facility-administered encounter medications on file as of 11/15/2012.  : Review of Systems  Constitutional: Positive for fatigue.  Respiratory:       Snoring  Gastrointestinal: Positive for diarrhea.       IBS  Endocrine: Positive for polydipsia.  Skin:       itching  Allergic/Immunologic: Positive for environmental allergies.  Neurological: Positive for speech difficulty, weakness and headaches.  Hematological: Bruises/bleeds easily.  Psychiatric/Behavioral: Positive for confusion. Sleep disturbance: insomnia.    Objective:  Neurologic Exam  Physical Exam Physical Examination:   Filed Vitals:   11/15/12 1328  BP: 133/91  Pulse: 112  Temp: 98.3 F (36.8 C)    General Examination: The patient is a very pleasant 67 y.o. female in no acute distress. 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   HEENT: Normocephalic, atraumatic, pupils are equal, round and reactive to light and accommodation. Extraocular tracking is good without limitation to gaze excursion or nystagmus noted. Normal smooth pursuit is noted. Hearing is grossly intact. Tympanic membranes are clear bilaterally. Face is symmetric with normal facial animation and normal facial sensation. Speech is clear with no dysarthria noted. There is no hypophonia. There is no lip, neck or jaw tremor. Neck is supple with full range of motion. There are no carotid bruits on auscultation. Oropharynx exam reveals: Moderate mouth dryness, adequate dental hygiene and mild airway crowding. Mallampati is class II. Tongue protrudes centrally and palate elevates symmetrically. .   Chest: Clear to auscultation without wheezing, rhonchi or crackles noted.  Heart: S1+S2+0, regular and normal without murmurs, rubs or gallops noted.   Abdomen: Soft,  non-tender and non-distended with normal bowel sounds appreciated on auscultation.  Extremities: There is 2+ pitting edema in the distal lower extremities bilaterally.   Skin: Warm and erythematous in the right distal lower extremity with some blanching noted on palpation. I am worried that this may be evolving into a cellulitis from the wound. She has a losing the wound in the distal aspect of her right lower leg above the shin. She has a open wound on her left knee area with minimal oozing noted and mild erythema in her distal left leg with no significant blanching noted.   Musculoskeletal: exam reveals no obvious joint deformities, tenderness or joint swelling or erythema.   Neurologically:  Mental status: The patient is awake, alert and oriented in all 4 spheres. Her memory, attention, language and knowledge are appropriate. There is no aphasia, agnosia, apraxia or anomia. Speech is clear with normal prosody and enunciation. Thought process is linear. Mood is congruent and affect is blunted.  Cranial nerves are as described above under HEENT exam. In addition, shoulder shrug is normal with equal shoulder height noted. Motor exam: Significantly thin bulk as opposed to larger trunk with normal tone noted. She has global mild weakness in the 4+ out of 5 Arena. Reflexes are 1+ throughout. Fine motor skills are preserved in the upper extremities but impaired in the lower extremities in part due to pain and swelling. She has significant left hip pain. She has no dysmetria or intention tremor. There is no truncal or gait ataxia.  Sensory exam is intact to light touch, pinprick, vibration, temperature sense and proprioception in the upper and lower extremities with the exception of mild decrease in temperature and pain sense in the distal  lower extremities bilaterally but this may be due to swelling. Gait and balance are abnormal: She stands up with difficulty and uses her cane for support. She walks with a  significant limp due to hip pain but also pain in her wound area on the right. She needs assistance in terms with difficulty. She cannot perform tandem walk or pull herself up on her toes and heels. 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   Assessment and Plan:   Assessment and Plan:  In summary, BERNARD DONAHOO is a very pleasant 67 y.o.-year old female with a history of gait disturbance, balance problems and recurrent falls with injuries. 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 She is globally mildly weak. I believe her gait disturbance and recurrent falls multifactorial in origin, due to her severe arthritis, her heaviness and thin muscle bulk, chronic back disease and recent injuries with poor healing. She is advised to undergo more testing: I would like to do a brain MRI without contrast to look into a central issue, but I am doubtful. She is strongly advised to seek consultation with a wound specialist and I made a referral in that regard. I will also do blood work to include inflammatory markers and CK. I am worried, that she may be developing cellulitis in her L leg, which will need to be addressed soon. I will see her back in one month to discuss test results. Once her wounds have healed a little bit better and her swelling is reduced she may do well with physical therapy. She is advised to use a walker at all times. I had a long chat with the patient and her husband about my findings and the most likely multifactorial etiology of her problems.  As far as medications are concerned, I recommended the following at this time: no change, but I did ask her to reduce sedating medications as much is possible which includes tramadol and her narcotic pain medicine.  I answered all their questions today and the patient and her husband were in agreement with the above outlined plan. I would like to see the patient back in 1 month, sooner if the need arises  and encouraged them to call with any interim questions, concerns, problems or updates.   Thank you very much for allowing me to participate in the care of this nice patient. If I can be of any further assistance to you please do not hesitate to call me at [phone].  Sincerely,   Star Age, MD, PhD

## 2012-11-15 NOTE — Patient Instructions (Addendum)
I think overall you are doing fairly well but I do want to suggest a few things today:  Please ask Dr. Annamaria Boots for a referral to a wound care specialist.   I will order a brain MRI and we will do blood work.   Remember to drink plenty of fluid, eat healthy meals and do not skip any meals. Try to eat protein with a every meal and eat a healthy snack such as fruit or nuts in between meals. Try to keep a regular sleep-wake schedule and try to exercise daily, particularly in the form of walking, 20-30 minutes a day, if you can.   As far as your medications are concerned, I would like to suggest cutting back on sedating medication as much as possible.   I would like to see you back in one month to discuss the test results, sooner if we need to. Please call us with any interim questions, concerns, problems, updates or refill requests.  Please also call us for any test results so we can go over those with you on the phone. Richardson Landry is my clinical assistant and will answer any of your questions and relay your messages to me and also relay most of my messages to you.  Our phone number is [phone]. We also have an after hours call service for urgent matters and there is a physician on-call for urgent questions. For any emergencies you know to call 911 or go to the nearest emergency room.

## 2012-11-15 NOTE — Assessment & Plan Note (Signed)
This may be viral, residual from an earlier infection, were more related to current pollen levels. She thinks an antibiotic would help Plan-Augmentin

## 2012-11-15 NOTE — Telephone Encounter (Signed)
Per CY-refer across street to [facility] Traumatic soft tissue injury bilateral lower legs subacute.

## 2012-11-15 NOTE — Telephone Encounter (Signed)
I spoke with Herbie Baltimore (pt spouse) and he stated when pt was in last 11/08/12 Dr. Annamaria Boots ahd advised them pt should see a wound care specialists. He is wanting to know if Dr. Annamaria Boots can refer them. Please advise Dr. Annamaria Boots thanks  Pending 05/10/13

## 2012-11-15 NOTE — Assessment & Plan Note (Signed)
Mild seasonal exacerbation

## 2012-11-15 NOTE — Assessment & Plan Note (Signed)
Plan-suggested she talk with her primary physician about wound center evaluation

## 2012-11-15 NOTE — Telephone Encounter (Signed)
Order was sent to [facility] Spoke with pt's spouse and notified that this was done He verbalized understanding and states nothing further needed

## 2012-11-16 LAB — COMPREHENSIVE METABOLIC PANEL
ALT: 18 IU/L (ref 0–32)
AST: 15 IU/L (ref 0–40)
Albumin/Globulin Ratio: 1.7 (ref 1.1–2.5)
Albumin: 4.4 g/dL (ref 3.6–4.8)
Alkaline Phosphatase: 87 IU/L (ref 39–117)
BUN/Creatinine Ratio: 16 (ref 11–26)
BUN: 12 mg/dL (ref 8–27)
CO2: 24 mmol/L (ref 19–28)
Calcium: 10.9 mg/dL — ABNORMAL HIGH (ref 8.6–10.2)
Chloride: 98 mmol/L (ref 97–108)
Creatinine, Ser: 0.73 mg/dL (ref 0.57–1.00)
GFR calc Af Amer: 98 mL/min/{1.73_m2} (ref >59)
GFR calc non Af Amer: 85 mL/min/{1.73_m2} (ref >59)
Globulin, Total: 2.6 g/dL (ref 1.5–4.5)
Glucose: 123 mg/dL — ABNORMAL HIGH (ref 65–99)
Potassium: 3.7 mmol/L (ref 3.5–5.2)
Sodium: 140 mmol/L (ref 134–144)
Total Bilirubin: 0.3 mg/dL (ref 0.0–1.2)
Total Protein: 7 g/dL (ref 6.0–8.5)

## 2012-11-16 LAB — CK TOTAL AND CKMB (NOT AT ARMC)
CK-MB Index: 2.3 ng/mL (ref 0.0–2.9)
Total CK: 31 U/L (ref 24–173)

## 2012-11-16 LAB — SEDIMENTATION RATE: Sed Rate: 16 mm/hr (ref 0–40)

## 2012-11-16 LAB — ANA W/REFLEX: Anti Nuclear Antibody(ANA): NEGATIVE

## 2012-11-16 LAB — CBC WITH DIFFERENTIAL
Basophils Absolute: 0 x10E3/uL (ref 0.0–0.2)
Basos: 0 % (ref 0–3)
Eos: 2 % (ref 0–5)
Eosinophils Absolute: 0.2 x10E3/uL (ref 0.0–0.4)
HCT: 46.3 % (ref 34.0–46.6)
Hemoglobin: 15.4 g/dL (ref 11.1–15.9)
Immature Grans (Abs): 0 x10E3/uL (ref 0.0–0.1)
Immature Granulocytes: 0 % (ref 0–2)
Lymphocytes Absolute: 1.8 x10E3/uL (ref 0.7–3.1)
Lymphs: 19 % (ref 14–46)
MCH: 31.6 pg (ref 26.6–33.0)
MCHC: 33.3 g/dL (ref 31.5–35.7)
MCV: 95 fL (ref 79–97)
Monocytes Absolute: 1.1 x10E3/uL — ABNORMAL HIGH (ref 0.1–0.9)
Monocytes: 11 % (ref 4–12)
Neutrophils Absolute: 6.2 x10E3/uL (ref 1.4–7.0)
Neutrophils Relative %: 68 % (ref 40–74)
Platelets: 392 x10E3/uL — ABNORMAL HIGH (ref 155–379)
RBC: 4.88 x10E6/uL (ref 3.77–5.28)
RDW: 13.8 % (ref 12.3–15.4)
WBC: 9.4 x10E3/uL (ref 3.4–10.8)

## 2012-11-16 LAB — C-REACTIVE PROTEIN: CRP: 19.8 mg/L — ABNORMAL HIGH (ref 0.0–4.9)

## 2012-11-16 LAB — HGB A1C W/O EAG: Hgb A1c MFr Bld: 6 % — ABNORMAL HIGH (ref 4.8–5.6)

## 2012-11-16 NOTE — Progress Notes (Signed)
Quick Note:  Please call and advise the patient that the recent labs we checked were for the most part within normal limits, with the exception of her diabetes marker showing borderline results, and that she may be at risk for diabetes. Also, while her white count was within normal range one of the inflammatory markers called CRP was high at 19.8 with the normal cut off at 4.9 which indicates, that she has some nonspecific systemic inflammatory reaction going on and it may be related to her injuries. Again this is a nonspecific sign and she does not have any other abnormalities including no evidence of muscle breakdown. Please remind patient to keep any upcoming appointments and to call us with any interim questions, concerns, problems or updates. Thanks,  Star Age, MD, PhD    ______

## 2012-11-19 ENCOUNTER — Telehealth: Payer: Self-pay | Admitting: Internal Medicine

## 2012-11-19 MED ORDER — AMOXICILLIN-POT CLAVULANATE 875-125 MG PO TABS: 1.0000 | ORAL_TABLET | Freq: Two times a day (BID) | ORAL | Status: DC

## 2012-11-19 NOTE — Progress Notes (Signed)
Quick Note:  Spoke with patient and relayed results of blood work. Patient understood and had no questions.  ______

## 2012-11-19 NOTE — Telephone Encounter (Signed)
Per CY-Augmentin 875 mg #20 take 1 po BID no refills.

## 2012-11-19 NOTE — Telephone Encounter (Signed)
rx sent. Pt is aware. [place] Bing, CMA

## 2012-11-19 NOTE — Telephone Encounter (Signed)
Last OV 11-08-12. Pt was given augmentin rx on 11-08-12 for cough, sore throat, nasal congestion. Pt states while on abx she felt symptoms improved but 1 week after finishing abx she began to develop symptoms again. She is now c/o sore throat, nasal drainage that is yellow/green, PND, and dry cough that is worse at night. She is taking flonase and singulair  as directed and is on allergy vaccine.  Please advise. [place] Bing, CMA Allergies  Allergen Reactions  . Lactose Intolerance (Gi) Diarrhea  . Other Diarrhea    "lettuce only"  . Tdap (Diphth-Acell Pertussis-Tetanus)     Shaking uncontrollably  . Doxycycline Other (See Comments)    Diarrhea   . Erythromycin Other (See Comments)    Diarrhea   . Tetanus Toxoids Other (See Comments)    Shaking uncontrollable

## 2012-11-20 ENCOUNTER — Encounter (HOSPITAL_BASED_OUTPATIENT_CLINIC_OR_DEPARTMENT_OTHER): Payer: Medicare Other | Attending: General Surgery

## 2012-11-20 DIAGNOSIS — L97809 Non-pressure chronic ulcer of other part of unspecified lower leg with unspecified severity: Secondary | ICD-10-CM | POA: Insufficient documentation

## 2012-11-20 DIAGNOSIS — I10 Essential (primary) hypertension: Secondary | ICD-10-CM | POA: Insufficient documentation

## 2012-11-20 DIAGNOSIS — Z79899 Other long term (current) drug therapy: Secondary | ICD-10-CM | POA: Insufficient documentation

## 2012-11-21 NOTE — H&P (Signed)
[name], [name]  MEDICAL RECORD NO.:  [id number]  LOCATION:  FOOT                         FACILITY:  [facility]  PHYSICIAN:  Elesa Hacker, M.D.        DATE OF BIRTH:  May 10, 1946  DATE OF ADMISSION:  11/20/2012 DATE OF DISCHARGE:                             HISTORY & PHYSICAL   CHIEF COMPLAINT:  Wound both legs.  HISTORY OF PRESENT ILLNESS:  On September 21, 2012 the patient fell.  She developed a sizable wound in her left knee.  She fell several days later and developed a wound on the right foreleg.  Neither of these wounds was sutured.  The patient has a significant history of steroid use.  PAST MEDICAL HISTORY: 1. Esophageal reflux. 2. Asthmatic bronchitis. 3. Osteoarthritis with multiple operations. 4. Crohn disease. 5. Allergic rhinitis. 6. Hypertension. 7. Depression.  PAST SURGICAL HISTORY:  Ankle fusion, several back surgeries, cesarean sections, gallbladder surgery, hysterectomy.  SOCIAL HISTORY:  Cigarettes none.  Alcohol none.  MEDICATIONS:  Xanax, Augmentin, Tessalon, Celebrex, Tussionex, Mycelex, Colestid, Nexium, Prozac, Flonase, Lasix, glycopyrrolate, hydrocodone, Atrovent, potassium chloride, Pravachol, Phenergan, tramadol, Parafon.  ALLERGIES:  DOXYCYCLINE, ERYTHROMYCIN, TETANUS TOXOID, and she is also LACTOSE intolerant.  REVIEW OF SYSTEMS:  As above.  PHYSICAL EXAMINATION:  VITAL SIGNS:  Temp 98.8, pulse 100, respirations 18, blood pressure 132/88. GENERAL APPEARANCE:  Well developed, somewhat cushingoid, in no distress. CHEST:  Clear. HEART:  Regular rhythm. ABDOMEN:  Not examined. EXTREMITIES:  She has swelling of both ankles and feet.  Pulses are palpable but weakly.  On the right anterior leg, there is a 3.5 x 1.5 ulcer, which is very close to the tibia.  On the left knee, there is a 2.7 x 4.3 wound which is covered with thick slough.  IMPRESSION:  Posttraumatic wounds probably with some evidence  of infection.  PLAN OF TREATMENT:  We will start with Santyl and hydrogel.  I will see her in 7 days, and I have ordered vascular studies.     Elesa Hacker, M.D.     RA/MEDQ  D:  11/20/2012  T:  11/21/2012  Job:  [id number]  cc:   Leonard Downing

## 2012-11-24 ENCOUNTER — Ambulatory Visit
Admission: RE | Admit: 2012-11-24 | Discharge: 2012-11-24 | Disposition: A | Discharge status: Home / Self Care | Payer: Medicare Other | Source: Ambulatory Visit | Attending: Neurology | Admitting: Neurology

## 2012-11-24 DIAGNOSIS — R51 Headache: Secondary | ICD-10-CM

## 2012-11-24 DIAGNOSIS — R269 Unspecified abnormalities of gait and mobility: Secondary | ICD-10-CM

## 2012-11-24 DIAGNOSIS — R296 Repeated falls: Secondary | ICD-10-CM

## 2012-11-24 DIAGNOSIS — R2689 Other abnormalities of gait and mobility: Secondary | ICD-10-CM

## 2012-11-27 ENCOUNTER — Encounter (HOSPITAL_BASED_OUTPATIENT_CLINIC_OR_DEPARTMENT_OTHER): Payer: Medicare Other

## 2012-11-27 NOTE — Progress Notes (Signed)
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 It is possible that she never had symptoms from these. They do not appear recent. These may have happened months or even years ago. It is possible that some of her gait instability does stem from these changes in her brain. In addition, she has moderate blood vessel disease indicating usually issues with chronic hypertension and chronic cholesterol problems or chronic diabetes. There is also a mild degree of generalized atrophy, meaning some shrinkage of the brain or volume loss. All in all we have to take care of secondary prevention, which means making sure a stroke does not happen in the future. This includes diabetes management, hypertension and cholesterol management, exercising daily and wt loss and other risk factor reduction. If she is able to, please ask her to start taking a baby aspirin daily. If she already is taking a baby aspirin every day please let me know, we may have to increase this to adult size aspirin daily. Also, I would like to add more testing to check on her risk factors for stroke. This would include a ultrasound of her heart, and blood vessel testing of her brain and neck. If she is agreeable to pursuing this, please let me know. This would mean an order for an echocardiogram, MRA brain and neck.   Thanks,  Star Age, MD, PhD    ______

## 2012-11-29 ENCOUNTER — Telehealth: Payer: Self-pay | Admitting: *Deleted

## 2012-11-29 NOTE — Telephone Encounter (Signed)
Left a message on the pt's home voice mail regarding her recent MRI and requesting a call back so that I may review the results with her as well as Dr Guadelupe Sabin recommendations.

## 2012-11-29 NOTE — Telephone Encounter (Signed)
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 It is possible that she never had symptoms from these. They do not appear recent. These may have happened months or even years ago. It is possible that some of her gait instability does stem from these changes in her brain. In addition, she has moderate blood vessel disease indicating usually issues with chronic hypertension and chronic cholesterol problems or chronic diabetes. There is also a mild degree of generalized atrophy, meaning some shrinkage of the brain or volume loss. All in all we have to take care of secondary prevention, which means making sure a stroke does not happen in the future. This includes diabetes management, hypertension and cholesterol management, exercising daily and wt loss and other risk factor reduction. If she is able to, please ask her to start taking a baby aspirin daily. If      she already is taking a baby aspirin every day please let me know, we may have to increase this to adult size aspirin daily. Also, I would like to add more testing to check on her risk factors for stroke. This would include a ultrasound of her heart, and blood vessel testing of her brain and neck. If she is agreeable to pursuing this, please let me know. This would mean an order for an echocardiogram, MRA brain and neck.             Thanks,       Star Age, MD, PhD                   ------

## 2012-11-29 NOTE — Telephone Encounter (Signed)
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 It is possible that she never had symptoms from these. They do not appear recent. These may have happened months or even years ago. It is possible that some of her gait instability does stem from these changes in her brain. In addition, she has moderate blood vessel disease indicating usually issues with chronic hypertension and chronic cholesterol problems or chronic diabetes. There is also a mild degree of generalized atrophy, meaning some shrinkage of the brain or volume loss. All in all we have to take care of secondary prevention, which means making sure a stroke does not happen in the future. This includes diabetes management, hypertension and cholesterol management, exercising daily and wt loss and other risk factor reduction. If she is able to, please ask her to start taking a baby aspirin daily. If      she already is taking a baby aspirin every day please let me know, we may have to increase this to adult size aspirin daily. Also, I would like to add more testing to check on her risk factors for stroke. This would include a ultrasound of her heart, and blood vessel testing of her brain and neck. If she is agreeable to pursuing this, please let me know. This would mean an order for an echocardiogram, MRA brain and neck.             Thanks,       Star Age, MD, PhD                   ------

## 2012-11-29 NOTE — Telephone Encounter (Signed)
Return call requested.

## 2012-11-30 ENCOUNTER — Other Ambulatory Visit: Payer: Self-pay | Admitting: Neurology

## 2012-11-30 ENCOUNTER — Telehealth: Payer: Self-pay | Admitting: Neurology

## 2012-11-30 ENCOUNTER — Telehealth: Payer: Self-pay | Admitting: *Deleted

## 2012-11-30 DIAGNOSIS — Z8673 Personal history of transient ischemic attack (TIA), and cerebral infarction without residual deficits: Secondary | ICD-10-CM

## 2012-11-30 DIAGNOSIS — I999 Unspecified disorder of circulatory system: Secondary | ICD-10-CM

## 2012-11-30 DIAGNOSIS — R269 Unspecified abnormalities of gait and mobility: Secondary | ICD-10-CM

## 2012-11-30 DIAGNOSIS — R9089 Other abnormal findings on diagnostic imaging of central nervous system: Secondary | ICD-10-CM

## 2012-11-30 NOTE — Telephone Encounter (Signed)
Spoke with patient and relayed the results of their MRI as well as Dr Guadelupe Sabin advise or recommendations.  The patient was also reminded of any future appointments.

## 2012-11-30 NOTE — Telephone Encounter (Signed)
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 It is possible that she never had symptoms from these. They do not appear recent. These may have happened months or even years ago. It is possible that some of her gait instability does stem from these changes in her brain. In addition, she has moderate blood vessel disease indicating usually issues with chronic hypertension and chronic cholesterol problems or chronic diabetes. There is also a mild degree of generalized atrophy, meaning some shrinkage of the brain or volume loss. All in all we have to take care of secondary prevention, which means making sure a stroke does not happen in the future. This includes diabetes management, hypertension and cholesterol management, exercising daily and wt loss and other risk factor reduction. If she is able to, please ask her to start taking a baby aspirin daily. If      she already is taking a baby aspirin every day please let me know, we may have to increase this to adult size aspirin daily. Also, I would like to add more testing to check on her risk factors for stroke. This would include a ultrasound of her heart, and blood vessel testing of her brain and neck. If she is agreeable to pursuing this, please let me know. This would mean an order for an echocardiogram, MRA brain and neck.             Thanks,       Star Age, MD, PhD                   ------

## 2012-11-30 NOTE — Progress Notes (Signed)
Quick Note:  Spoke with patient and relayed the results of their MRI as well as Dr Guadelupe Sabin advise or recommendations. The patient was also reminded of any future appointments. Both [name] are agreeable to the ECG and the MRA. Please schedule.  ______

## 2012-11-30 NOTE — Telephone Encounter (Signed)
See result note

## 2012-12-04 ENCOUNTER — Other Ambulatory Visit: Payer: Self-pay | Admitting: General Surgery

## 2012-12-04 DIAGNOSIS — I739 Peripheral vascular disease, unspecified: Secondary | ICD-10-CM

## 2012-12-05 ENCOUNTER — Ambulatory Visit (HOSPITAL_COMMUNITY)
Admission: RE | Admit: 2012-12-05 | Discharge: 2012-12-05 | Disposition: A | Discharge status: Home / Self Care | Payer: Medicare Other | Source: Ambulatory Visit | Attending: Cardiovascular Disease | Admitting: Cardiovascular Disease

## 2012-12-05 DIAGNOSIS — I739 Peripheral vascular disease, unspecified: Secondary | ICD-10-CM | POA: Insufficient documentation

## 2012-12-06 NOTE — Progress Notes (Deleted)
Arterial Duplex Completed. [name]

## 2012-12-06 NOTE — Progress Notes (Signed)
Arterial Duplex Completed. [name]

## 2012-12-17 ENCOUNTER — Encounter: Payer: Self-pay | Admitting: Neurology

## 2012-12-17 ENCOUNTER — Ambulatory Visit (HOSPITAL_COMMUNITY): Payer: Medicare Other | Attending: Cardiology | Admitting: Radiology

## 2012-12-17 ENCOUNTER — Ambulatory Visit (INDEPENDENT_AMBULATORY_CARE_PROVIDER_SITE_OTHER): Payer: Medicare Other | Admitting: Neurology

## 2012-12-17 VITALS — BP 134/92 | HR 93 | Temp 97.3°F | Ht 67.0 in | Wt 178.0 lb

## 2012-12-17 DIAGNOSIS — R29818 Other symptoms and signs involving the nervous system: Secondary | ICD-10-CM

## 2012-12-17 DIAGNOSIS — R9089 Other abnormal findings on diagnostic imaging of central nervous system: Secondary | ICD-10-CM

## 2012-12-17 DIAGNOSIS — T1490XA Injury, unspecified, initial encounter: Secondary | ICD-10-CM

## 2012-12-17 DIAGNOSIS — I999 Unspecified disorder of circulatory system: Secondary | ICD-10-CM

## 2012-12-17 DIAGNOSIS — Z8673 Personal history of transient ischemic attack (TIA), and cerebral infarction without residual deficits: Secondary | ICD-10-CM | POA: Insufficient documentation

## 2012-12-17 DIAGNOSIS — R296 Repeated falls: Secondary | ICD-10-CM

## 2012-12-17 DIAGNOSIS — Z9181 History of falling: Secondary | ICD-10-CM

## 2012-12-17 DIAGNOSIS — R9430 Abnormal result of cardiovascular function study, unspecified: Secondary | ICD-10-CM | POA: Insufficient documentation

## 2012-12-17 DIAGNOSIS — R269 Unspecified abnormalities of gait and mobility: Secondary | ICD-10-CM

## 2012-12-17 DIAGNOSIS — I672 Cerebral atherosclerosis: Secondary | ICD-10-CM

## 2012-12-17 DIAGNOSIS — I6789 Other cerebrovascular disease: Secondary | ICD-10-CM

## 2012-12-17 NOTE — Patient Instructions (Addendum)
I think overall you are doing fairly well but I do want to suggest a few things today:  Ask your wound care doctor about when it may be safe to start PT.   Remember to drink plenty of fluid, eat healthy meals and do not skip any meals. Try to eat protein with a every meal and eat a healthy snack such as fruit or nuts in between meals. Try to keep a regular sleep-wake schedule and try to exercise daily, particularly in the form of walking, 20-30 minutes a day, if you can.   As far as your medications are concerned, I would like to suggest to continue baby aspiring for stroke prevention.   As far as diagnostic testing: no new test, but pending MRA head and neck.   I would like to see you back in 3 months, sooner if we need to. Please call us with any interim questions, concerns, problems, updates or refill requests.  Please also call us for any test results so we can go over those with you on the phone. Richardson Landry is my clinical assistant and will answer any of your questions and relay your messages to me and also relay most of my messages to you.  Our phone number is [phone]. We also have an after hours call service for urgent matters and there is a physician on-call for urgent questions. For any emergencies you know to call 911 or go to the nearest emergency room.

## 2012-12-17 NOTE — Progress Notes (Signed)
Subjective:    [name] is a 67 y.o. female.  HPI  Interim history:   [name] is a very pleasant 67 year old right-handed woman with an underlying medical history of allergies, hyperlipidemia, hypertension, vitamin D deficiency and arthritis who presents for followup consultation of her gait disturbance and recurrent falls. I first met her at the request of Dr. Arelia Sneddon on 11/15/2012 and suggested close followup after blood work, MRI brain without contrast and your referral for concern for cellulitis. She has had multiple recent falls with injuries. 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 Her blood work was for the  most part unremarkable but she had a borderline hemoglobin A1c. CRP was high at 19.8 She has been going to the wound doctor every Tuesday and her wounds are doing better. Her Lasix has been increased to 40 mg bid. She is having more insomnia at night. She is trying to be up by 9 AM. She still endorses a lot of stress. She has not fallen recently. She had a echocardiogram today. She has a MRA head and neck pending 2 days from today and she had a US venous doppler LE was neg. for clots.  Her MRI brain showed remote lacunar infarcts in the BG area and she was asked to start taking an aspirin and I ordered neck and head MRA, which are pending.   Her Past Medical History Is Significant For: Past Medical History  Diagnosis Date  . Esophageal reflux   . Acute asthmatic bronchitis   . Rheumatoid arthritis(714.0)   . Allergic rhinitis     Her Past Surgical History Is Significant For: Past Surgical History  Procedure Laterality Date  . Ankle fusion  2009    left  . Back surgery      Her Family History Is Significant For: Family History  Problem Relation Age of Onset  . Heart disease Mother   . Arthritis Mother   . Heart attack Father   . Diabetes Other     sibling  . Heart attack Other     sibling    Her Social History Is  Significant For: History   Social History  . Marital Status: Married    Spouse Name: N/A    Number of Children: 2  . Years of Education: N/A   Social History Main Topics  . Smoking status: Never Smoker   . Smokeless tobacco: None     Comment: positive passive tobacco smoke exposure  . Alcohol Use: Yes     Comment: glass of wine each night   . Drug Use: None  . Sexually Active: None   Other Topics Concern  . None   Social History Narrative   Exercise - at least 2 times weekly   Caffeine - 2 cups in the morning          Her Allergies Are:  Allergies  Allergen Reactions  . Lactose Intolerance (Gi) Diarrhea  . Other Diarrhea    "lettuce only"  . Tdap (Diphth-Acell Pertussis-Tetanus)     Shaking uncontrollably  . Doxycycline Other (See Comments)    Diarrhea   . Erythromycin Other (See Comments)    Diarrhea   . Tetanus Toxoids Other (See Comments)    Shaking uncontrollable     Her Current Medications Are:  Outpatient Encounter Prescriptions as of 12/17/2012  Medication Sig Dispense Refill  . albuterol (PROAIR HFA) 108 (90 BASE) MCG/ACT inhaler Inhale 2 puffs into the  lungs every 6 (six) hours as needed for wheezing or shortness of breath.  1 Inhaler  prn  . ALPRAZolam (XANAX) 1 MG tablet Take 1 mg by mouth at bedtime as needed for sleep.      Marland Kitchen amoxicillin-clavulanate (AUGMENTIN) 875-125 MG per tablet Take 1 tablet by mouth 2 (two) times daily.  20 tablet  0  . benzonatate (TESSALON) 100 MG capsule Take 100 mg by mouth 4 (four) times daily as needed.       . celecoxib (CELEBREX) 200 MG capsule Take 200 mg by mouth daily.       . clotrimazole (MYCELEX) 10 MG troche Take 10 mg by mouth daily.       . colestipol (COLESTID) 1 G tablet Take 1 g by mouth daily.      Marland Kitchen EPINEPHrine (EPIPEN 2-PAK) 0.3 mg/0.3 mL DEVI Inject 0.3 mLs (0.3 mg total) into the muscle once. Take as directed for severe allergic reaction  1 Device  prn  . ergocalciferol (VITAMIN D2) 50000 UNITS capsule  Take 50,000 Units by mouth every 14 (fourteen) days.        Marland Kitchen esomeprazole (NEXIUM) 40 MG capsule Take 40 mg by mouth every evening.      Marland Kitchen FLUoxetine (PROZAC) 20 MG tablet Take 20 mg by mouth daily.        . fluticasone (FLONASE) 50 MCG/ACT nasal spray Place 2 sprays into the nose daily.       . furosemide (LASIX) 20 MG tablet Take 40 mg by mouth 2 (two) times daily.       Marland Kitchen glycopyrrolate (ROBINUL) 1 MG tablet Take 1 mg by mouth as needed (for IBS-can take up to TID if needed).       Marland Kitchen HYDROcodone-acetaminophen (NORCO) 10-325 MG per tablet Take by mouth every 6 (six) hours as needed for pain.       Marland Kitchen HYDROcodone-homatropine (HYCODAN) 5-1.5 MG/5ML syrup Take 5 mLs by mouth every 6 (six) hours as needed for cough.  240 mL  0  . ipratropium (ATROVENT) 0.03 % nasal spray 1-2 sprays each nostril 3 times daily as needed  30 mL  12  . Menthol, Topical Analgesic, (BLUE-EMU MAXIMUM STRENGTH EX) Apply 1 application topically daily as needed (for hip, back pain).      . montelukast (SINGULAIR) 10 MG tablet TAKE 1 TABLET (10 MG TOTAL) BY MOUTH AT BEDTIME.  30 tablet  3  . potassium chloride SA (K-DUR,KLOR-CON) 20 MEQ tablet Take 20 mEq by mouth 4 (four) times daily.        . pravastatin (PRAVACHOL) 40 MG tablet Take 40 mg by mouth daily.       . promethazine (PHENERGAN) 25 MG tablet Take 25 mg by mouth as needed for nausea.       Marland Kitchen SANTYL ointment       . traMADol (ULTRAM) 50 MG tablet Take 50 mg by mouth every 8 (eight) hours as needed for pain.      . chlorpheniramine-HYDROcodone (TUSSIONEX) 10-8 MG/5ML LQCR Take 5 mLs by mouth every 12 (twelve) hours as needed (cough).      . Dextromethorphan-Guaifenesin (ROBITUSSIN DM) 10-100 MG/5ML liquid Take 5 mLs by mouth every 12 (twelve) hours.      . [DISCONTINUED] budesonide (ENTOCORT EC) 3 MG 24 hr capsule Take 9 mg by mouth every morning.      . [DISCONTINUED] chlorzoxazone (PARAFON) 500 MG tablet        No facility-administered encounter medications on file  as  of 12/17/2012.    Review of Systems  Constitutional: Positive for appetite change.  Cardiovascular: Positive for leg swelling.  Musculoskeletal: Positive for arthralgias.  Allergic/Immunologic: Positive for environmental allergies.  Neurological: Positive for dizziness.  Psychiatric/Behavioral:       Too much sleep    Objective:  Neurologic Exam  Physical Exam Physical Examination:   Filed Vitals:   12/17/12 1558  BP: 134/92  Pulse: 93  Temp: 97.3 F (36.3 C)    General Examination: The patient is a very pleasant 67 y.o. female in no acute distress. 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 Her face is also cushingoid, but less puffy than last time.   HEENT: Normocephalic, atraumatic, pupils are equal, round and reactive to light and accommodation. Extraocular tracking is good without limitation to gaze excursion or nystagmus noted. Normal smooth pursuit is noted. Hearing is grossly intact. Tympanic membranes are clear bilaterally. Face is symmetric with normal facial animation and normal facial sensation. Speech is clear with no dysarthria noted. There is no hypophonia. There is no lip, neck or jaw tremor. Neck is supple with full range of motion. There are no carotid bruits on auscultation. Oropharynx exam reveals: Moderate mouth dryness, adequate dental hygiene and mild airway crowding. Mallampati is class II. Tongue protrudes centrally and palate elevates symmetrically. .   Chest: Clear to auscultation without wheezing, rhonchi or crackles noted.  Heart: S1+S2+0, regular and normal without murmurs, rubs or gallops noted.   Abdomen: Soft, non-tender and non-distended with normal bowel sounds appreciated on auscultation.  Extremities: There is 2+ pitting edema in the distal lower extremities bilaterally.   Skin: Warm and erythematous in the right distal lower extremity with some blanching noted on palpation. I am worried that this may  be evolving into a cellulitis from the wound. She has a losing the wound in the distal aspect of her right lower leg above the shin. She has a open wound on her left knee area with minimal oozing noted and mild erythema in her distal left leg with no significant blanching noted.   Musculoskeletal: exam reveals no obvious joint deformities, tenderness or joint swelling or erythema.   Neurologically:  Mental status: The patient is awake, alert and oriented in all 4 spheres. Her memory, attention, language and knowledge are appropriate. There is no aphasia, agnosia, apraxia or anomia. Speech is clear with normal prosody and enunciation. Thought process is linear. Mood is congruent and affect is blunted.  Cranial nerves are as described above under HEENT exam. In addition, shoulder shrug is normal with equal shoulder height noted. Motor exam: Significantly thin bulk as opposed to larger trunk with normal tone noted. She has global mild weakness in the 4+ out of 5 [name]. Reflexes are 1+ throughout. Fine motor skills are preserved in the upper extremities but impaired in the lower extremities in part due to pain and swelling. She has significant left hip pain. She has no dysmetria or intention tremor. There is no truncal or gait ataxia.  Sensory exam is intact to light touch. Gait and balance are abnormal: She stands up with difficulty and uses her cane for support. She walks with a limp due to hip pain but also pain in her wound area on the right, this has improved. She walks with better confidence. 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    Assessment and Plan:    In summary, [name] is a very pleasant 67 y.o.-year old female with a  history of gait disturbance, balance problems and recurrent falls with injuries. Her exam is somewhat improved, but still, she has cushingoid features including a quite thin bulk and significant truncal heaviness, which is better. She is globally mildly weak. 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 She is advised to undergo more testing after the brain MRI showed lacunar remote strokes. Once her wounds have healed a little bit better and her swelling is reduced she may do well with physical therapy. She is advised to use a walker at all times. I had a long chat with the patient and her husband again today, about my findings and the most likely multifactorial etiology of her problems. We talked about secondary stroke prevention.  As far as medications are concerned, I recommended the following at this time: no change, and  I asked her to continue with baby aspirin. I would like to see her back in 3 months from now, sooner if the need arises and have asked her to ask for wound care of Dr. when it may be safe for her to start physical therapy from the wound care standpoint. She and her husband were in agreement. They are to call with any interim questions, concerns or problems.

## 2012-12-17 NOTE — Progress Notes (Signed)
Echocardiogram performed.  

## 2012-12-18 ENCOUNTER — Encounter (HOSPITAL_BASED_OUTPATIENT_CLINIC_OR_DEPARTMENT_OTHER): Payer: Medicare Other | Attending: General Surgery

## 2012-12-18 DIAGNOSIS — W19XXXA Unspecified fall, initial encounter: Secondary | ICD-10-CM | POA: Insufficient documentation

## 2012-12-18 DIAGNOSIS — S81809A Unspecified open wound, unspecified lower leg, initial encounter: Secondary | ICD-10-CM | POA: Insufficient documentation

## 2012-12-18 DIAGNOSIS — S81009A Unspecified open wound, unspecified knee, initial encounter: Secondary | ICD-10-CM | POA: Insufficient documentation

## 2012-12-18 DIAGNOSIS — S91009A Unspecified open wound, unspecified ankle, initial encounter: Secondary | ICD-10-CM | POA: Insufficient documentation

## 2012-12-18 NOTE — Progress Notes (Signed)
Quick Note:  I called and spoke with patient concerning her 2-D echo results showing no significant abnormalities. ______

## 2012-12-18 NOTE — Progress Notes (Signed)
Quick Note:  Please call and inform patient or her husband that her recent echocardiogram which is the ultrasound of the heart showed no significant abnormalities. ______

## 2012-12-19 ENCOUNTER — Ambulatory Visit
Admission: RE | Admit: 2012-12-19 | Discharge: 2012-12-19 | Disposition: A | Discharge status: Home / Self Care | Payer: Medicare Other | Source: Ambulatory Visit | Attending: Neurology | Admitting: Neurology

## 2012-12-19 DIAGNOSIS — Z8673 Personal history of transient ischemic attack (TIA), and cerebral infarction without residual deficits: Secondary | ICD-10-CM

## 2012-12-19 DIAGNOSIS — R9089 Other abnormal findings on diagnostic imaging of central nervous system: Secondary | ICD-10-CM

## 2012-12-19 DIAGNOSIS — I999 Unspecified disorder of circulatory system: Secondary | ICD-10-CM

## 2012-12-19 DIAGNOSIS — R269 Unspecified abnormalities of gait and mobility: Secondary | ICD-10-CM

## 2012-12-19 MED ORDER — GADOBENATE DIMEGLUMINE 529 MG/ML IV SOLN
20.0000 mL | Freq: Once | INTRAVENOUS | Status: AC | PRN
  Administered 2012-12-19: 20 mL via INTRAVENOUS

## 2012-12-21 NOTE — Progress Notes (Signed)
Quick Note:  Pls notify pt of her normal brain blood vessel scan, i.e. Brain MRA, thx Star Age, MD, PhD [facility] ([place])  ______

## 2012-12-24 NOTE — Progress Notes (Signed)
Quick Note:  Called and spoke to patient told her normal MRI. ______

## 2012-12-31 ENCOUNTER — Ambulatory Visit (INDEPENDENT_AMBULATORY_CARE_PROVIDER_SITE_OTHER): Payer: Medicare Other

## 2012-12-31 DIAGNOSIS — J309 Allergic rhinitis, unspecified: Secondary | ICD-10-CM

## 2013-01-21 ENCOUNTER — Encounter (HOSPITAL_BASED_OUTPATIENT_CLINIC_OR_DEPARTMENT_OTHER): Payer: Medicare Other | Attending: General Surgery

## 2013-01-21 DIAGNOSIS — I87319 Chronic venous hypertension (idiopathic) with ulcer of unspecified lower extremity: Secondary | ICD-10-CM | POA: Insufficient documentation

## 2013-01-21 DIAGNOSIS — L97909 Non-pressure chronic ulcer of unspecified part of unspecified lower leg with unspecified severity: Secondary | ICD-10-CM | POA: Insufficient documentation

## 2013-02-05 ENCOUNTER — Ambulatory Visit: Payer: Medicare Other | Admitting: Internal Medicine

## 2013-02-05 NOTE — Progress Notes (Signed)
[facility]  NAME:  RILYN, SCROGGS NO.:  [id number]  MEDICAL RECORD NO.:  [id number]      DATE OF BIRTH:  1946-06-22  PHYSICIAN:  Judene Companion, M.D.           VISIT DATE:                                  OFFICE VISIT   [name] is a very pleasant 67 year old lady who I have been taking care of in the [facility] for several months.  She fell at that time and developed a wound on her left knee and her right lower leg.  These wounds have been treated by her doctor and later they were sent here to [place] and we had to do some debridement and also treated them with Santyl. Lately, we have been treating her with several types of collagens and most recently Oasis applications.  The wound on her knee is 2 cm in diameter and the one on her right leg is about 3 cm x 1 cm.  I feel she has a great deal of venous stasis also in these lower extremities, and I feel that some of her problem of healing is due to her venous stasis.  I would like to apply for Apligraf as I think this will would greatly benefit her and healing these open wounds.     Judene Companion, M.D.     PP/MEDQ  D:  02/04/2013  T:  02/05/2013  Job:  [id number]

## 2013-02-12 ENCOUNTER — Telehealth: Payer: Self-pay | Admitting: Neurology

## 2013-02-18 ENCOUNTER — Encounter (HOSPITAL_BASED_OUTPATIENT_CLINIC_OR_DEPARTMENT_OTHER): Payer: Medicare Other | Attending: General Surgery

## 2013-02-18 ENCOUNTER — Other Ambulatory Visit: Payer: Self-pay | Admitting: Internal Medicine

## 2013-02-18 DIAGNOSIS — L97909 Non-pressure chronic ulcer of unspecified part of unspecified lower leg with unspecified severity: Secondary | ICD-10-CM | POA: Insufficient documentation

## 2013-02-18 DIAGNOSIS — I87319 Chronic venous hypertension (idiopathic) with ulcer of unspecified lower extremity: Secondary | ICD-10-CM | POA: Insufficient documentation

## 2013-02-18 NOTE — Telephone Encounter (Signed)
Please advise if ok to refill. Thanks. 

## 2013-02-18 NOTE — Telephone Encounter (Signed)
Ok to refil 

## 2013-02-21 ENCOUNTER — Other Ambulatory Visit: Payer: Self-pay | Admitting: Internal Medicine

## 2013-02-27 ENCOUNTER — Other Ambulatory Visit: Payer: Self-pay | Admitting: Internal Medicine

## 2013-03-19 ENCOUNTER — Encounter (HOSPITAL_BASED_OUTPATIENT_CLINIC_OR_DEPARTMENT_OTHER): Payer: Medicare Other | Attending: General Surgery

## 2013-03-19 DIAGNOSIS — L97909 Non-pressure chronic ulcer of unspecified part of unspecified lower leg with unspecified severity: Secondary | ICD-10-CM | POA: Insufficient documentation

## 2013-03-19 DIAGNOSIS — I87319 Chronic venous hypertension (idiopathic) with ulcer of unspecified lower extremity: Secondary | ICD-10-CM | POA: Insufficient documentation

## 2013-03-20 ENCOUNTER — Ambulatory Visit: Payer: Medicare Other | Admitting: Neurology

## 2013-03-29 ENCOUNTER — Other Ambulatory Visit: Payer: Self-pay | Admitting: *Deleted

## 2013-03-29 MED ORDER — MONTELUKAST SODIUM 10 MG PO TABS: ORAL_TABLET | ORAL | Status: DC

## 2013-04-22 ENCOUNTER — Encounter (HOSPITAL_BASED_OUTPATIENT_CLINIC_OR_DEPARTMENT_OTHER): Payer: Medicare Other | Attending: General Surgery

## 2013-04-22 DIAGNOSIS — L97909 Non-pressure chronic ulcer of unspecified part of unspecified lower leg with unspecified severity: Secondary | ICD-10-CM | POA: Insufficient documentation

## 2013-04-22 DIAGNOSIS — I87319 Chronic venous hypertension (idiopathic) with ulcer of unspecified lower extremity: Secondary | ICD-10-CM | POA: Insufficient documentation

## 2013-04-29 ENCOUNTER — Ambulatory Visit (INDEPENDENT_AMBULATORY_CARE_PROVIDER_SITE_OTHER): Payer: Medicare Other | Admitting: Neurology

## 2013-04-29 ENCOUNTER — Encounter (INDEPENDENT_AMBULATORY_CARE_PROVIDER_SITE_OTHER): Payer: Self-pay

## 2013-04-29 ENCOUNTER — Encounter: Payer: Self-pay | Admitting: Neurology

## 2013-04-29 VITALS — BP 142/85 | HR 115 | Temp 97.2°F | Ht 67.0 in | Wt 171.0 lb

## 2013-04-29 DIAGNOSIS — R29818 Other symptoms and signs involving the nervous system: Secondary | ICD-10-CM

## 2013-04-29 DIAGNOSIS — R269 Unspecified abnormalities of gait and mobility: Secondary | ICD-10-CM

## 2013-04-29 DIAGNOSIS — T1490XA Injury, unspecified, initial encounter: Secondary | ICD-10-CM

## 2013-04-29 DIAGNOSIS — Z9181 History of falling: Secondary | ICD-10-CM

## 2013-04-29 DIAGNOSIS — R930 Abnormal findings on diagnostic imaging of skull and head, not elsewhere classified: Secondary | ICD-10-CM

## 2013-04-29 DIAGNOSIS — R296 Repeated falls: Secondary | ICD-10-CM

## 2013-04-29 DIAGNOSIS — R9089 Other abnormal findings on diagnostic imaging of central nervous system: Secondary | ICD-10-CM

## 2013-04-29 DIAGNOSIS — R2689 Other abnormalities of gait and mobility: Secondary | ICD-10-CM

## 2013-04-29 NOTE — Patient Instructions (Signed)
For your insomnia, you can continue the mirtazapine 15 mg each night and can add melatonin 5 to 6 mg at night as needed.  We will do physical therapy once your right leg is healed.

## 2013-04-29 NOTE — Progress Notes (Signed)
Subjective:    [name] is a 67 y.o. female.  HPI  Interim history:   [name] is a very pleasant 67 year old right-handed woman with an underlying medical history of allergies, hyperlipidemia, hypertension, vitamin D deficiency and arthritis who presents for followup consultation of her gait disturbance and recurrent falls. She's accompanied by her husband again today. I last saw her on 12/17/2012, which time I asked her to use her walker at all times and continue using aspirin. She is still going to the wound care center every Tuesday and has done really well. She took 60 days worth of Doxycycline, longer for the R side. She has a wedding to go to this weekend on . She has been feeling a lot better overall. She is on Vicodin for back pain and hip pain. She sees Dr. Ellene Route for her back pain.  I first met her at the request of Dr. Arelia Sneddon on 11/15/2012 and I suggested close followup, blood work, MRI brain without contrast and a wound care referral for concern for cellulitis in her legs. She has had multiple recent falls with injuries. 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 Her blood work was for the most part unremarkable but she had a a borderline hemoglobin A1c. CRP was high at 19.8.   She has been going to the wound doctor and her wounds are doing better. Her Lasix has been increased to 40 mg bid. She had a US venous doppler LE, which was negative for clots and her echocardiogram was normal.  Her MRI brain showed remote lacunar infarcts in the BG area and she was asked to start taking an aspirin and I ordered neck and head MRA, which she had on 12/19/12: The brain MRA was normal. The neck MRA showed left common carotid artery origin has focal decreased signal which may be due to atherosclerosis or artifact. 2. Bilateral vertebral arteries proximally have tortuous courses and irregular flow signal which may be artifactual; atherosclerosis  or fibromuscular dysplasia are less likely etiologies. Also, several segments of the distal vertebral arteries are cut out of the field-of-view on 3-D time-of-flight views. The vertebrobasilar junctions and basilar artery have normal flow.  Her Past Medical History Is Significant For: Past Medical History  Diagnosis Date  . Esophageal reflux   . Acute asthmatic bronchitis   . Rheumatoid arthritis(714.0)   . Allergic rhinitis     Her Past Surgical History Is Significant For: Past Surgical History  Procedure Laterality Date  . Ankle fusion  2009    left  . Back surgery      Her Family History Is Significant For: Family History  Problem Relation Age of Onset  . Heart disease Mother   . Arthritis Mother   . Heart attack Father   . Diabetes Other     sibling  . Heart attack Other     sibling    Her Social History Is Significant For: History   Social History  . Marital Status: Married    Spouse Name: N/A    Number of Children: 2  . Years of Education: N/A   Social History Main Topics  . Smoking status: Never Smoker   . Smokeless tobacco: None     Comment: positive passive tobacco smoke exposure  . Alcohol Use: Yes     Comment: glass of wine each night   . Drug Use: None  . Sexual Activity: Yes   Other Topics  Concern  . None   Social History Narrative   Exercise - at least 2 times weekly   Caffeine - 2 cups in the morning          Her Allergies Are:  Allergies  Allergen Reactions  . Lactose Intolerance (Gi) Diarrhea  . Other Diarrhea    "lettuce only"  . Tdap [Diphth-Acell Pertussis-Tetanus]     Shaking uncontrollably  . Doxycycline Other (See Comments)    Diarrhea   . Erythromycin Other (See Comments)    Diarrhea   . Tetanus Toxoids Other (See Comments)    Shaking uncontrollable    :   Her Current Medications Are:  Outpatient Encounter Prescriptions as of 04/29/2013  Medication Sig Dispense Refill  . albuterol (PROAIR HFA) 108 (90 BASE)  MCG/ACT inhaler Inhale 2 puffs into the lungs every 6 (six) hours as needed for wheezing or shortness of breath.  1 Inhaler  prn  . ALPRAZolam (XANAX) 1 MG tablet Take 1 mg by mouth at bedtime as needed for sleep.      Marland Kitchen aspirin 81 MG tablet Take 81 mg by mouth daily.      . benzonatate (TESSALON) 100 MG capsule Take 100 mg by mouth 4 (four) times daily as needed.       . celecoxib (CELEBREX) 200 MG capsule Take 200 mg by mouth daily.       . chlorpheniramine-HYDROcodone (TUSSIONEX) 10-8 MG/5ML LQCR Take 5 mLs by mouth every 12 (twelve) hours as needed (cough).      . clotrimazole (MYCELEX) 10 MG troche TAKE 1 TABLET BY MOUTH 3 TIMES A DAY AS NEEDED  60 tablet  2  . colestipol (COLESTID) 1 G tablet Take 1 g by mouth daily. Take 1.5 tabs daily      . ergocalciferol (VITAMIN D2) 50000 UNITS capsule Take 50,000 Units by mouth every 14 (fourteen) days.        Marland Kitchen esomeprazole (NEXIUM) 40 MG capsule Take 40 mg by mouth every evening.      Marland Kitchen FLUoxetine (PROZAC) 20 MG tablet Take 20 mg by mouth daily.        . fluticasone (FLONASE) 50 MCG/ACT nasal spray Place 2 sprays into the nose daily.       . furosemide (LASIX) 20 MG tablet Take 40 mg by mouth 2 (two) times daily.       Marland Kitchen glycopyrrolate (ROBINUL) 1 MG tablet Take 1 mg by mouth as needed (for IBS-can take up to TID if needed).       Marland Kitchen HYDROcodone-acetaminophen (NORCO) 10-325 MG per tablet Take by mouth every 6 (six) hours as needed for pain.       Marland Kitchen ipratropium (ATROVENT) 0.03 % nasal spray USE 1-2 SPRAYS EACH NOSTRIL 3 TIMES DAILY AS NEEDED  30 mL  5  . Menthol, Topical Analgesic, (BLUE-EMU MAXIMUM STRENGTH EX) Apply 1 application topically daily as needed (for hip, back pain).      . mirtazapine (REMERON) 15 MG tablet Take 15 mg by mouth at bedtime.      . montelukast (SINGULAIR) 10 MG tablet TAKE 1 TABLET (10 MG TOTAL) BY MOUTH AT BEDTIME.  90 tablet  1  . potassium chloride SA (K-DUR,KLOR-CON) 20 MEQ tablet Take 20 mEq by mouth 4 (four) times  daily.        . pravastatin (PRAVACHOL) 40 MG tablet Take 40 mg by mouth daily.       Marland Kitchen SANTYL ointment       . silver  sulfADIAZINE (SILVADENE) 1 % cream Apply 1 application topically once a week.       . traMADol (ULTRAM) 50 MG tablet Take 50 mg by mouth every 8 (eight) hours as needed for pain.      . [DISCONTINUED] HYDROcodone-homatropine (HYCODAN) 5-1.5 MG/5ML syrup Take 5 mLs by mouth every 6 (six) hours as needed for cough.  240 mL  0  . [DISCONTINUED] promethazine (PHENERGAN) 25 MG tablet Take 25 mg by mouth as needed for nausea.       . [DISCONTINUED] amoxicillin-clavulanate (AUGMENTIN) 875-125 MG per tablet Take 1 tablet by mouth 2 (two) times daily.  20 tablet  0  . [DISCONTINUED] Dextromethorphan-Guaifenesin (ROBITUSSIN DM) 10-100 MG/5ML liquid Take 5 mLs by mouth every 12 (twelve) hours.       No facility-administered encounter medications on file as of 04/29/2013.  : Review of Systems  Constitutional: Positive for fatigue.  HENT: Positive for hearing loss.   Musculoskeletal:       Joint pain, Aching Muscles  Skin:       Moles  Allergic/Immunologic: Positive for environmental allergies.  Hematological: Bruises/bleeds easily.  Psychiatric/Behavioral: Positive for sleep disturbance.       Depression, Anxiety, Not Enough Sleep, Racing Thought    Objective:  Neurologic Exam  Physical Exam Physical Examination:   Filed Vitals:   04/29/13 1529  BP: 142/85  Pulse: 115  Temp: 97.2 F (36.2 C)    General Examination: The patient is a very pleasant 67 y.o. female in no acute distress. She appears less cushingoid with less significant truncal obesity around her trunk and thin limbs. Her face is also cushingoid, but less puffy than last time. She looks well today.  HEENT: Normocephalic, atraumatic, pupils are equal, round and reactive to light and accommodation. Extraocular tracking is good without limitation to gaze excursion or nystagmus noted. Normal smooth pursuit is  noted. Hearing is grossly intact. Face is symmetric with normal facial animation and normal facial sensation. Speech is clear with no dysarthria noted. There is no hypophonia. There is no lip, neck or jaw tremor. Neck is supple with full range of motion. There are no carotid bruits on auscultation. Oropharynx exam reveals: Moderate mouth dryness, adequate dental hygiene and mild airway crowding. Mallampati is class II. Tongue protrudes centrally and palate elevates symmetrically. .   Chest: Clear to auscultation without wheezing, rhonchi or crackles noted.  Heart: S1+S2+0, regular and normal without murmurs, rubs or gallops noted.   Abdomen: Soft, non-tender and non-distended with normal bowel sounds appreciated on auscultation.  Extremities: There is 2+ pitting edema in the distal lower extremities bilaterally. Her wound has healed well in the left knee. Her left leg looks very much improved.   Skin: Warm and erythematous in the right distal lower extremity with some blanching noted on palpation. I am worried that this may be evolving into a cellulitis from the wound. She has a losing the wound in the distal aspect of her right lower leg above the shin. She has a open wound on her left knee area with minimal oozing noted and mild erythema in her distal left leg with no significant blanching noted.   Musculoskeletal: exam reveals no obvious joint deformities, tenderness or joint swelling or erythema.   Neurologically:  Mental status: The patient is awake, alert and oriented in all 4 spheres. Her memory, attention, language and knowledge are appropriate. There is no aphasia, agnosia, apraxia or anomia. Speech is clear with normal prosody and enunciation. Thought process  is linear. Mood is congruent and affect is blunted.  Cranial nerves are as described above under HEENT exam. In addition, shoulder shrug is normal with equal shoulder height noted. Motor exam: Significantly thin bulk as opposed to  larger trunk with normal tone noted. She has global very mild weakness. Reflexes are 1+ throughout. Fine motor skills are preserved in the upper extremities but impaired in the lower extremities in part due to pain and swelling. She has significant left hip pain. She has no dysmetria or intention tremor. There is no truncal or gait ataxia.  Sensory exam is intact to light touch. Gait and balancel: She stands up with mild difficulty and uses her cane for support. She walks with a limp due to hip pain but also pain in her wound area on the right, this has improved. She walks with better confidence. 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   Assessment and Plan:   In summary, VELIA PAMER is a very pleasant 67 y.o. female with a history of gait disturbance, balance problems and recurrent falls with injuries. Her exam is improved, and she overall looks better and feels better. 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 She is advised to use a walker at all times. We completed her stroke workup. I had a long chat with the patient and her husband again today, about my findings and the most likely multifactorial etiology of her problems. We talked about secondary stroke prevention. I want her to continue to stay active. She is making progress with her wound healing. Once her right leg is healed we will proceed with physical therapy. As far as medications are concerned, I recommended that she continue with baby aspirin. She is suffering from insomnia and I suggested that she add melatonin. She has been given a prescription for mirtazapine by her primary care physician. She can take up to 3 pills but has tried 2 pills which did not really help. She is wondering why her primary care physician did not prescribe Ambien. I  explained to her that she is at risk for falling and Ambien can cause side effects including balance problems and sleepwalking and I  her primary care physician is probably not risking that with her. I would like to see her back in 3 months from now, sooner if the need arises. She and her husband were in agreement. They are to call with any interim questions, concerns or problems.

## 2013-05-02 ENCOUNTER — Other Ambulatory Visit: Payer: Self-pay | Admitting: Neurological Surgery

## 2013-05-02 DIAGNOSIS — M545 Low back pain, unspecified: Secondary | ICD-10-CM

## 2013-05-10 ENCOUNTER — Ambulatory Visit (INDEPENDENT_AMBULATORY_CARE_PROVIDER_SITE_OTHER): Payer: Medicare Other | Admitting: Internal Medicine

## 2013-05-10 ENCOUNTER — Encounter: Payer: Self-pay | Admitting: Internal Medicine

## 2013-05-10 VITALS — BP 120/78 | HR 98 | Ht 62.25 in | Wt 169.0 lb

## 2013-05-10 DIAGNOSIS — J328 Other chronic sinusitis: Secondary | ICD-10-CM

## 2013-05-10 MED ORDER — CLINDAMYCIN HCL 300 MG PO CAPS: 300.0000 mg | ORAL_CAPSULE | Freq: Three times a day (TID) | ORAL | Status: DC

## 2013-05-10 NOTE — Patient Instructions (Addendum)
Script for cleocin antibiotic- take with food  Please call as needed

## 2013-05-10 NOTE — Progress Notes (Signed)
Subjective:    [name], female    DOB: Jul 16, 1946, 67 y.o.   MRN: [id number]  HPI 02/03/11- 54 yoF never smoker followed for allergic rhinitis, rhinosinusitis, asthmatic bronchitis, complicated by , GERD, rheumatoid arthritis. Last here August, 3, 2011 She has has done much better since last here- maybe because she is past the turmoil of moving last year. Not needing her nebulizer recently. Rescue inhaler used less than once/ week. Talked about the death of her aged mother.  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   02/06/12- 64 yoF followed for allergic rhinitis, rhinosinusitis, asthmatic bronchitis, complicated by , GERD, rheumatoid arthritis   Follows for: takes vaccine and has some sinus drainage when she eats.  She has responded a couple of x2 a Z-Pak when needed. Overall much better since she moved to her new home 2 years ago. Uses her rescue inhaler at bedtime. Less nighttime cough she lies on her side. 5 grandchildren stay with her, which is a stress and the source of colds. Watery rhinorrhea when she is.Troches have worked well for yeast prn.   03/28/2012 Acute OV  Complains of prod cough with w/ green/yellow/white mucus, wheezing, increased SOB, chills x2.5weeks, worse x1day.  OTC not helping .  Cough is keeping her up at night.  No hemoptysis or chest pian .  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  Never smoker.   09/20/12- 66 yoF followed for allergic rhinitis, rhinosinusitis, asthmatic bronchitis, complicated by , GERD/ Crohn's, rheumatoid arthritis  Husband here. FOLLOWS FOR: still having headaches, sinus drainage, and dizzy. Was given Augmentin and cough syrup last week. Had fallen in shower in August 2013-had a hematoma and no broken bones. Now- increased SOB/ DOE x 3 weeks. Dry cough at night. Chronic globus. Mucus plug from nose.  CXR 09/10/12-  IMPRESSION:  No acute findings.  Original Report Authenticated By: Lorin Picket, M.D.  11/08/12- 66 yoF followed for allergic rhinitis, rhinosinusitis, asthmatic bronchitis, complicated by , GERD/ Crohn's, rheumatoid arthritis  Husband here. FOLLOWS PPJ:KDTOI on allergy vaccine1:10 GO and doing well; no reactions. Has had flare up with allergies; for about 3 weeks now. Never really cleared completely after exacerbation at last visit when we gave cefdinir.Marland Kitchen Has had increasing cough with scant yellow green sputum starting about a week ago. Denies fever or sore throat. Occasional wheeze. Increased ankle edema. Dr. Arelia Sneddon increased her Lasix. She had fallen on ice during the winter and badly skinned her right pretibial area and left knee. We talked about being seen at a wound center. She is to see a neurologist about her falls. She complains of always feeling very tired and husband says she snores. I educated them on watching for sleep apnea. CXR 09/10/12 IMPRESSION:  No acute findings.  Original Report Authenticated By: Lorin Picket, M.D.  05/10/13- 67 yoF followed for allergic rhinitis, rhinosinusitis, asthmatic bronchitis, complicated by , GERD/ Crohn's, rheumatoid arthritis  Husband here. Allergy vaccine1:10 GO FOLLOWS FOR:  Sinus pressure w/ PND and cough w/ mucus green in color x2 months.  Also, having abdominal pain and frequent urination Had flu vaccine. Describes 2 months increased sinus drainage with some green mucus, frontal headaches. No fever, ear pressure or wheezing. Still recovering from abrasion to legs when she fell last winter. Has been going to wound Center.  ROS-see HPI Constitutional:   No-   weight loss, night sweats, fevers, chills, fatigue, lassitude. HEENT:   +  headaches, no-difficulty swallowing, tooth/dental problems, sore throat,       No-  sneezing, itching, ear ache, +nasal congestion, post nasal drip,  CV:  No-   chest pain, orthopnea, PND, swelling in lower extremities, anasarca, dizziness, palpitations Resp: +shortness of breath with  exertion or at rest.              No-   +productive cough,  +non-productive cough,  No- coughing up of blood.              +  change in color of mucus.  No- wheezing.   Skin: +slowly healing wounds on lower legs GI:  No-   heartburn, indigestion, abdominal pain, nausea, vomiting,  GU: . MS:  +  joint pain or swelling.   Neuro-     nothing unusual Psych:  No- change in mood or affect. No depression + anxiety.  No memory loss.  Objective:  OBJ- Physical Exam General- Alert, Oriented, Affect-appropriate, Distress- none acute. Truncal obesity Skin- +eschar on exposed skin wound R lower leg. Dressing on L knee.  Lymphadenopathy- none Head- atraumatic            Eyes- Gross vision intact, PERRLA, conjunctivae and secretions clear            Ears- Hearing, canals-normal            Nose- +turbinate edema, no-Septal dev, mucus, polyps, erosion, perforation             Throat- Mallampati II , mucosa red , drainage- none, tonsils- atrophic Neck- flexible , trachea midline, no stridor , thyroid nl, carotid no bruit Chest - symmetrical excursion , unlabored           Heart/CV- RRR , no murmur , no gallop  , no rub, nl s1 s2                           - JVD- none , edema-trace,            Lung- +raspy cough without wheeze , dullness-none, rub- none           Chest wall-  Abd-  Br/ Gen/ Rectal- Not done, not indicated Extrem- + right lower leg is wrapped Neuro- grossly intact to observation  Assessment & Plan:

## 2013-05-20 ENCOUNTER — Encounter (HOSPITAL_BASED_OUTPATIENT_CLINIC_OR_DEPARTMENT_OTHER): Payer: Medicare Other | Attending: General Surgery

## 2013-05-20 ENCOUNTER — Ambulatory Visit
Admission: RE | Admit: 2013-05-20 | Discharge: 2013-05-20 | Disposition: A | Discharge status: Home / Self Care | Payer: Medicare Other | Source: Ambulatory Visit | Attending: Neurological Surgery | Admitting: Neurological Surgery

## 2013-05-20 DIAGNOSIS — M545 Low back pain, unspecified: Secondary | ICD-10-CM

## 2013-05-20 DIAGNOSIS — I87319 Chronic venous hypertension (idiopathic) with ulcer of unspecified lower extremity: Secondary | ICD-10-CM | POA: Insufficient documentation

## 2013-05-20 DIAGNOSIS — L97909 Non-pressure chronic ulcer of unspecified part of unspecified lower leg with unspecified severity: Secondary | ICD-10-CM | POA: Insufficient documentation

## 2013-05-20 MED ORDER — GADOBENATE DIMEGLUMINE 529 MG/ML IV SOLN
14.0000 mL | Freq: Once | INTRAVENOUS | Status: AC | PRN
  Administered 2013-05-20: 14 mL via INTRAVENOUS

## 2013-05-26 NOTE — Assessment & Plan Note (Signed)
Acute on chronic sinusitis Plan-saline rinse, Cleocin

## 2013-06-11 ENCOUNTER — Encounter: Payer: Self-pay | Admitting: Internal Medicine

## 2013-06-11 ENCOUNTER — Ambulatory Visit (INDEPENDENT_AMBULATORY_CARE_PROVIDER_SITE_OTHER): Payer: Medicare Other | Admitting: Internal Medicine

## 2013-06-11 VITALS — BP 118/64 | HR 92 | Temp 98.0°F | Ht 62.25 in | Wt 167.6 lb

## 2013-06-11 DIAGNOSIS — J45998 Other asthma: Secondary | ICD-10-CM

## 2013-06-11 DIAGNOSIS — J40 Bronchitis, not specified as acute or chronic: Secondary | ICD-10-CM

## 2013-06-11 DIAGNOSIS — J45909 Unspecified asthma, uncomplicated: Secondary | ICD-10-CM

## 2013-06-11 MED ORDER — CEFDINIR 300 MG PO CAPS: 300.0000 mg | ORAL_CAPSULE | Freq: Two times a day (BID) | ORAL | Status: DC

## 2013-06-11 MED ORDER — PROMETHAZINE-CODEINE 6.25-10 MG/5ML PO SYRP: 5.0000 mL | ORAL_SOLUTION | Freq: Four times a day (QID) | ORAL | Status: DC | PRN

## 2013-06-11 NOTE — Progress Notes (Signed)
Subjective:    [name], female    DOB: Apr 27, 1946, 67 y.o.   MRN: [id number]  HPI 02/03/11- 4 yoF never smoker followed for allergic rhinitis, rhinosinusitis, asthmatic bronchitis, complicated by , GERD, rheumatoid arthritis. Last here August, 3, 2011 She has has done much better since last here- maybe because she is past the turmoil of moving last year. Not needing her nebulizer recently. Rescue inhaler used less than once/ week. Talked about the death of her aged mother.  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   02/06/12- 64 yoF followed for allergic rhinitis, rhinosinusitis, asthmatic bronchitis, complicated by , GERD, rheumatoid arthritis   Follows for: takes vaccine and has some sinus drainage when she eats.  She has responded a couple of x2 a Z-Pak when needed. Overall much better since she moved to her new home 2 years ago. Uses her rescue inhaler at bedtime. Less nighttime cough she lies on her side. 5 grandchildren stay with her, which is a stress and the source of colds. Watery rhinorrhea when she is.Troches have worked well for yeast prn.   03/28/2012 Acute OV  Complains of prod cough with w/ green/yellow/white mucus, wheezing, increased SOB, chills x2.5weeks, worse x1day.  OTC not helping .  Cough is keeping her up at night.  No hemoptysis or chest pian .  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  Never smoker.   09/20/12- 66 yoF followed for allergic rhinitis, rhinosinusitis, asthmatic bronchitis, complicated by , GERD/ Crohn's, rheumatoid arthritis  Husband here. FOLLOWS FOR: still having headaches, sinus drainage, and dizzy. Was given Augmentin and cough syrup last week. Had fallen in shower in August 2013-had a hematoma and no broken bones. Now- increased SOB/ DOE x 3 weeks. Dry cough at night. Chronic globus. Mucus plug from nose.  CXR 09/10/12-  IMPRESSION:  No acute findings.  Original Report Authenticated By: Lorin Picket, M.D.  11/08/12- 66 yoF followed for allergic rhinitis, rhinosinusitis, asthmatic bronchitis, complicated by , GERD/ Crohn's, rheumatoid arthritis  Husband here. FOLLOWS JQD:UKRCV on allergy vaccine1:10 GO and doing well; no reactions. Has had flare up with allergies; for about 3 weeks now. Never really cleared completely after exacerbation at last visit when we gave cefdinir.Marland Kitchen Has had increasing cough with scant yellow green sputum starting about a week ago. Denies fever or sore throat. Occasional wheeze. Increased ankle edema. Dr. Arelia Sneddon increased her Lasix. She had fallen on ice during the winter and badly skinned her right pretibial area and left knee. We talked about being seen at a wound center. She is to see a neurologist about her falls. She complains of always feeling very tired and husband says she snores. I educated them on watching for sleep apnea. CXR 09/10/12 IMPRESSION:  No acute findings.  Original Report Authenticated By: Lorin Picket, M.D.  06/11/13-   05/10/13- 67 yoF followed for allergic rhinitis, rhinosinusitis, asthmatic bronchitis, complicated by , GERD/ Crohn's, rheumatoid arthritis  Husband here. Allergy vaccine1:10 GO FOLLOWS FOR:  Sinus pressure w/ PND and cough w/ mucus green in color x2 months.  Also, having abdominal pain and frequent urination Had flu vaccine. Describes 2 months increased sinus drainage with some green mucus, frontal headaches. No fever, ear pressure or wheezing. Still recovering from abrasion to legs when she fell last winter. Has been going to wound Center.  05/10/13- 67 yoF followed for allergic rhinitis, rhinosinusitis, asthmatic bronchitis, complicated by , GERD/ Crohn's, rheumatoid arthritis  Husband here.  Allergy vaccine1:10 GO  FOLLOWS FOR: Sinus pressure w/ PND and cough w/ mucus green in color x2 months. Also, having abdominal pain and frequent urination  Had flu vaccine. Describes 2 months increased sinus drainage with some  green mucus, frontal headaches. No fever, ear pressure or wheezing.  Still recovering from abrasion to legs when she fell last winter. Has been going to wound Center.  06/11/13- 67 yoF followed for allergic rhinitis, rhinosinusitis, asthmatic bronchitis, complicated by , GERD/ Crohn's, rheumatoid arthritis   c/o sinus congestion x 1 wk. feels like fluid in head and ears,no ear pain,coughing at night-yellow and green,feels like fever today,nasal  congestion -clear This began with an upper respiratory infection but has become focused in her head She continues allergy vaccine 1: 10 GO  ROS-see HPI Constitutional:   No-   weight loss, night sweats, fevers, chills, fatigue, lassitude. HEENT:   + headaches, no-difficulty swallowing, tooth/dental problems, sore throat,       No-  sneezing, itching, ear ache, +nasal congestion, post nasal drip,  CV:  No-   chest pain, orthopnea, PND, swelling in lower extremities, anasarca, dizziness, palpitations Resp: +shortness of breath with exertion or at rest.              No-   +productive cough,  +non-productive cough,  No- coughing up of blood.              +  change in color of mucus.  No- wheezing.   Skin: +slowly healing wounds on lower legs GI:  No-   heartburn, indigestion, abdominal pain, nausea, vomiting,  GU: . MS:  +  joint pain or swelling.   Neuro-     nothing unusual Psych:  No- change in mood or affect. No depression + anxiety.  No memory loss.  Objective:  OBJ- Physical Exam General- Alert, Oriented, Affect-appropriate, Distress- none acute. Truncal obesity Skin- +eschar on exposed skin wound R lower leg. Dressing on L knee.  Lymphadenopathy- none Head- atraumatic            Eyes- Gross vision intact, PERRLA, conjunctivae and secretions clear            Ears- Hearing, canals-normal            Nose- +turbinate edema, no-Septal dev, mucus, polyps, erosion, perforation             Throat- Mallampati II , mucosa red , drainage+ clear mucus  he, tonsils- atrophic Neck- flexible , trachea midline, no stridor , thyroid nl, carotid no bruit Chest - symmetrical excursion , unlabored           Heart/CV- RRR , no murmur , no gallop  , no rub, nl s1 s2                           - JVD- none , edema-trace,            Lung- cough-none, wheeze-none , dullness-none, rub- none           Chest wall-  Abd-  Br/ Gen/ Rectal- Not done, not indicated Extrem- + right lower leg is wrapped Neuro- grossly intact to observation  Assessment & Plan:

## 2013-06-11 NOTE — Patient Instructions (Signed)
Script sent for [facility] antibiotic  Script for cough syrup  Sample Dymista nasal spray   1-2 puffs each nostril once daily at bedtime as needed

## 2013-06-19 ENCOUNTER — Ambulatory Visit (INDEPENDENT_AMBULATORY_CARE_PROVIDER_SITE_OTHER): Payer: Medicare Other

## 2013-06-19 DIAGNOSIS — J309 Allergic rhinitis, unspecified: Secondary | ICD-10-CM

## 2013-06-28 ENCOUNTER — Telehealth: Payer: Self-pay | Admitting: Pulmonary Disease

## 2013-06-28 MED ORDER — DOXYCYCLINE HYCLATE 100 MG PO TABS: ORAL_TABLET | ORAL | Status: DC

## 2013-06-28 NOTE — Telephone Encounter (Signed)
Pt c/o ongoing deep cough.  Unable to cough anything up.  Congestion in throat.  Some wheezing.  Denies SOB or fever.  Finished Omnicef 1 week ago and almost out of Promethazine with Codeine. Please advise Allergies  Allergen Reactions  . Lactose Intolerance (Gi) Diarrhea  . Other Diarrhea    "lettuce only"  . Tdap [Diphth-Acell Pertussis-Tetanus]     Shaking uncontrollably  . Erythromycin Other (See Comments)    Diarrhea   . Tetanus Toxoids Other (See Comments)    Shaking uncontrollable

## 2013-06-28 NOTE — Telephone Encounter (Signed)
Offer doxycycline 100 mg, # 8, 2 today then one daily      Mucinex DM and extra fluids may help loosen the cough.

## 2013-06-28 NOTE — Telephone Encounter (Signed)
Pt informed of Dr Bertrum Sol recommendations .  Rx for Doxy sent to pharmacy

## 2013-06-29 NOTE — Assessment & Plan Note (Signed)
Acute upper respiratory infection with tracheobronchitis Plan-sample Dymista for nasal drainage, Omnicef. Discussed fluids and supportive care.

## 2013-06-29 NOTE — Assessment & Plan Note (Signed)
Adequate control unless the present illness increases bronchospasm

## 2013-07-16 ENCOUNTER — Telehealth: Payer: Self-pay | Admitting: Internal Medicine

## 2013-07-16 DIAGNOSIS — R609 Edema, unspecified: Secondary | ICD-10-CM

## 2013-07-16 NOTE — Telephone Encounter (Signed)
I see where potassium was dc'd at last visit, probably because I thought it should come from Dr Arelia Sneddon, who was prescribing the lasix. Suggest order BMET off potassium. Dx peripheral edema

## 2013-07-16 NOTE — Telephone Encounter (Signed)
I spoke with [name]. She reports Dr. Arelia Sneddon would not refill her potassium. She reports she was told by Dr. Arelia Sneddon office that Dr. Annamaria Boots sent him a note back in October stating [name] should not take potassium. [name] reports if she does not take this she feels aweful. She wants to know if it is okay to take this. Looked in [name] chart and do not see where this was so. Please advise thanks

## 2013-07-16 NOTE — Telephone Encounter (Signed)
Pt reports she will come in the AM for labs. Nothing further needed

## 2013-07-17 ENCOUNTER — Other Ambulatory Visit (INDEPENDENT_AMBULATORY_CARE_PROVIDER_SITE_OTHER): Payer: Medicare Other

## 2013-07-17 DIAGNOSIS — R609 Edema, unspecified: Secondary | ICD-10-CM

## 2013-07-17 LAB — BASIC METABOLIC PANEL
BUN: 19 mg/dL (ref 6–23)
CO2: 29 mEq/L (ref 19–32)
Calcium: 9.9 mg/dL (ref 8.4–10.5)
Chloride: 104 mEq/L (ref 96–112)
Creatinine, Ser: 0.9 mg/dL (ref 0.4–1.2)
GFR: 69.48 mL/min (ref >60.00)
Glucose, Bld: 97 mg/dL (ref 70–99)
Potassium: 4.2 mEq/L (ref 3.5–5.1)
Sodium: 140 mEq/L (ref 135–145)

## 2013-07-26 DIAGNOSIS — R509 Fever, unspecified: Secondary | ICD-10-CM | POA: Diagnosis not present

## 2013-07-26 DIAGNOSIS — K5289 Other specified noninfective gastroenteritis and colitis: Secondary | ICD-10-CM | POA: Diagnosis not present

## 2013-07-26 DIAGNOSIS — R5383 Other fatigue: Secondary | ICD-10-CM | POA: Diagnosis not present

## 2013-07-26 DIAGNOSIS — R5381 Other malaise: Secondary | ICD-10-CM | POA: Diagnosis not present

## 2013-07-26 DIAGNOSIS — J309 Allergic rhinitis, unspecified: Secondary | ICD-10-CM | POA: Diagnosis not present

## 2013-07-29 ENCOUNTER — Telehealth: Payer: Self-pay | Admitting: Internal Medicine

## 2013-07-29 MED ORDER — AMOXICILLIN-POT CLAVULANATE 875-125 MG PO TABS: 1.0000 | ORAL_TABLET | Freq: Two times a day (BID) | ORAL | Status: DC

## 2013-07-29 MED ORDER — PROMETHAZINE-CODEINE 6.25-10 MG/5ML PO SYRP: 5.0000 mL | ORAL_SOLUTION | Freq: Four times a day (QID) | ORAL | Status: DC | PRN

## 2013-07-29 NOTE — Telephone Encounter (Signed)
Per CY-Augmentin 875 mg #20 take 1 po BID no refills and Promethazine with codeine cough syrup # 200 ml 1 tsp every 6 hours prn (if she needs cough syrup) no refills. Thanks.

## 2013-07-29 NOTE — Telephone Encounter (Signed)
Pt advised. ABX sent to pharmacy rx for cough syrup printed and placed at front. [place] Bing, CMA

## 2013-07-29 NOTE — Telephone Encounter (Signed)
Called and spoke with pt and she stated that she has a cough with yellow/green sputum.  Unable to lay down and sleep at night due to the cough.  Body aches but no fever.  She stated that she was seen on 06/11/13 for the same thing.  Pt was given the following abx:  Cefdinir on 06/11/2013 Doxy 06/28/2013  Pt stated that she gets better while on the abx but it just comes back once she is done with the abx.  CY please advise. Thanks   Last ov--06/11/2013 Next ov--11/06/2013   Allergies  Allergen Reactions  . Lactose Intolerance (Gi) Diarrhea  . Other Diarrhea    "lettuce only"  . Tdap [Diphth-Acell Pertussis-Tetanus]     Shaking uncontrollably  . Erythromycin Other (See Comments)    Diarrhea   . Tetanus Toxoids Other (See Comments)    Shaking uncontrollable       Current Outpatient Prescriptions on File Prior to Visit  Medication Sig Dispense Refill  . albuterol (PROAIR HFA) 108 (90 BASE) MCG/ACT inhaler Inhale 2 puffs into the lungs every 6 (six) hours as needed for wheezing or shortness of breath.  1 Inhaler  prn  . ALPRAZolam (XANAX) 1 MG tablet Take 1 mg by mouth at bedtime as needed for sleep.      Marland Kitchen aspirin 81 MG tablet Take 81 mg by mouth daily.      . benzonatate (TESSALON) 100 MG capsule Take 100 mg by mouth 4 (four) times daily as needed.       . cefdinir (OMNICEF) 300 MG capsule Take 1 capsule (300 mg total) by mouth 2 (two) times daily.  20 capsule  0  . celecoxib (CELEBREX) 200 MG capsule Take 200 mg by mouth daily.       . clotrimazole (MYCELEX) 10 MG troche TAKE 1 TABLET BY MOUTH 3 TIMES A DAY AS NEEDED  60 tablet  2  . colestipol (COLESTID) 1 G tablet Take 1 g by mouth daily. Take 1.5 tabs daily      . doxycycline (VIBRA-TABS) 100 MG tablet Take 2 tablets today then one tablet daily until gone  8 tablet  0  . ergocalciferol (VITAMIN D2) 50000 UNITS capsule Take 50,000 Units by mouth every 14 (fourteen) days.        Marland Kitchen esomeprazole (NEXIUM) 40 MG capsule Take 40 mg  by mouth every evening.      Marland Kitchen FLUoxetine (PROZAC) 20 MG tablet Take 20 mg by mouth daily.        . fluticasone (FLONASE) 50 MCG/ACT nasal spray Place 2 sprays into the nose daily.       . furosemide (LASIX) 20 MG tablet Take 40 mg by mouth 2 (two) times daily.       Marland Kitchen glycopyrrolate (ROBINUL) 1 MG tablet Take 1 mg by mouth as needed (for IBS-can take up to TID if needed).       Marland Kitchen HYDROcodone-acetaminophen (NORCO) 10-325 MG per tablet Take by mouth every 6 (six) hours as needed for pain.       Marland Kitchen ipratropium (ATROVENT) 0.03 % nasal spray USE 1-2 SPRAYS EACH NOSTRIL 3 TIMES DAILY AS NEEDED  30 mL  5  . Menthol, Topical Analgesic, (BLUE-EMU MAXIMUM STRENGTH EX) Apply 1 application topically daily as needed (for hip, back pain).      . mirtazapine (REMERON) 15 MG tablet Take 15 mg by mouth at bedtime.      . montelukast (SINGULAIR) 10 MG tablet TAKE 1 TABLET (10  MG TOTAL) BY MOUTH AT BEDTIME.  90 tablet  1  . pravastatin (PRAVACHOL) 40 MG tablet Take 40 mg by mouth daily.       . predniSONE (DELTASONE) 10 MG tablet Take 10 mg by mouth. Take 1 1/2 tablets every day-taper.      . promethazine-codeine (PHENERGAN WITH CODEINE) 6.25-10 MG/5ML syrup Take 5 mLs by mouth every 6 (six) hours as needed for cough.  200 mL  0  . SANTYL ointment       . silver sulfADIAZINE (SILVADENE) 1 % cream Apply 1 application topically once a week.       . traMADol (ULTRAM) 50 MG tablet Take 50 mg by mouth every 8 (eight) hours as needed for pain.       No current facility-administered medications on file prior to visit.

## 2013-07-31 ENCOUNTER — Ambulatory Visit (INDEPENDENT_AMBULATORY_CARE_PROVIDER_SITE_OTHER)
Admission: RE | Admit: 2013-07-31 | Discharge: 2013-07-31 | Disposition: A | Discharge status: Home / Self Care | Payer: Medicare Other | Source: Ambulatory Visit | Attending: Adult Health | Admitting: Adult Health

## 2013-07-31 ENCOUNTER — Telehealth: Payer: Self-pay | Admitting: Internal Medicine

## 2013-07-31 ENCOUNTER — Encounter: Payer: Self-pay | Admitting: Adult Health

## 2013-07-31 ENCOUNTER — Inpatient Hospital Stay (HOSPITAL_COMMUNITY)
Admission: EM | Admit: 2013-07-31 | Discharge: 2013-08-06 | DRG: 871 | Disposition: A | Discharge status: Home Health | Payer: Medicare Other | Attending: Internal Medicine | Admitting: Internal Medicine

## 2013-07-31 ENCOUNTER — Encounter (HOSPITAL_COMMUNITY): Payer: Self-pay | Admitting: Emergency Medicine

## 2013-07-31 ENCOUNTER — Ambulatory Visit (INDEPENDENT_AMBULATORY_CARE_PROVIDER_SITE_OTHER): Payer: Medicare Other | Admitting: Adult Health

## 2013-07-31 VITALS — BP 112/66 | HR 102 | Temp 101.5°F | Ht 62.0 in | Wt 172.0 lb

## 2013-07-31 DIAGNOSIS — K219 Gastro-esophageal reflux disease without esophagitis: Secondary | ICD-10-CM | POA: Diagnosis not present

## 2013-07-31 DIAGNOSIS — J40 Bronchitis, not specified as acute or chronic: Secondary | ICD-10-CM

## 2013-07-31 DIAGNOSIS — R05 Cough: Secondary | ICD-10-CM | POA: Diagnosis not present

## 2013-07-31 DIAGNOSIS — Z7982 Long term (current) use of aspirin: Secondary | ICD-10-CM

## 2013-07-31 DIAGNOSIS — Z96659 Presence of unspecified artificial knee joint: Secondary | ICD-10-CM | POA: Diagnosis not present

## 2013-07-31 DIAGNOSIS — J1100 Influenza due to unidentified influenza virus with unspecified type of pneumonia: Secondary | ICD-10-CM | POA: Diagnosis not present

## 2013-07-31 DIAGNOSIS — J984 Other disorders of lung: Secondary | ICD-10-CM | POA: Diagnosis not present

## 2013-07-31 DIAGNOSIS — J9600 Acute respiratory failure, unspecified whether with hypoxia or hypercapnia: Secondary | ICD-10-CM | POA: Diagnosis not present

## 2013-07-31 DIAGNOSIS — K589 Irritable bowel syndrome without diarrhea: Secondary | ICD-10-CM | POA: Diagnosis present

## 2013-07-31 DIAGNOSIS — J45998 Other asthma: Secondary | ICD-10-CM

## 2013-07-31 DIAGNOSIS — M069 Rheumatoid arthritis, unspecified: Secondary | ICD-10-CM

## 2013-07-31 DIAGNOSIS — J90 Pleural effusion, not elsewhere classified: Secondary | ICD-10-CM | POA: Diagnosis not present

## 2013-07-31 DIAGNOSIS — R059 Cough, unspecified: Secondary | ICD-10-CM | POA: Diagnosis not present

## 2013-07-31 DIAGNOSIS — J209 Acute bronchitis, unspecified: Secondary | ICD-10-CM | POA: Diagnosis present

## 2013-07-31 DIAGNOSIS — M549 Dorsalgia, unspecified: Secondary | ICD-10-CM | POA: Diagnosis present

## 2013-07-31 DIAGNOSIS — R0902 Hypoxemia: Secondary | ICD-10-CM

## 2013-07-31 DIAGNOSIS — G4700 Insomnia, unspecified: Secondary | ICD-10-CM | POA: Diagnosis not present

## 2013-07-31 DIAGNOSIS — J309 Allergic rhinitis, unspecified: Secondary | ICD-10-CM | POA: Diagnosis present

## 2013-07-31 DIAGNOSIS — J45901 Unspecified asthma with (acute) exacerbation: Secondary | ICD-10-CM | POA: Diagnosis not present

## 2013-07-31 DIAGNOSIS — R079 Chest pain, unspecified: Secondary | ICD-10-CM | POA: Diagnosis not present

## 2013-07-31 DIAGNOSIS — A419 Sepsis, unspecified organism: Principal | ICD-10-CM | POA: Diagnosis present

## 2013-07-31 DIAGNOSIS — J189 Pneumonia, unspecified organism: Secondary | ICD-10-CM

## 2013-07-31 DIAGNOSIS — R0989 Other specified symptoms and signs involving the circulatory and respiratory systems: Secondary | ICD-10-CM | POA: Diagnosis not present

## 2013-07-31 DIAGNOSIS — J45909 Unspecified asthma, uncomplicated: Secondary | ICD-10-CM | POA: Diagnosis not present

## 2013-07-31 DIAGNOSIS — R0602 Shortness of breath: Secondary | ICD-10-CM | POA: Diagnosis not present

## 2013-07-31 DIAGNOSIS — J09X1 Influenza due to identified novel influenza A virus with pneumonia: Secondary | ICD-10-CM

## 2013-07-31 LAB — COMPREHENSIVE METABOLIC PANEL
ALT: 20 U/L (ref 0–35)
AST: 38 U/L — ABNORMAL HIGH (ref 0–37)
Albumin: 2.9 g/dL — ABNORMAL LOW (ref 3.5–5.2)
Alkaline Phosphatase: 84 U/L (ref 39–117)
BUN: 25 mg/dL — ABNORMAL HIGH (ref 6–23)
CO2: 17 mEq/L — ABNORMAL LOW (ref 19–32)
Calcium: 9.6 mg/dL (ref 8.4–10.5)
Chloride: 103 mEq/L (ref 96–112)
Creatinine, Ser: 0.85 mg/dL (ref 0.50–1.10)
GFR calc Af Amer: 79 mL/min — ABNORMAL LOW (ref >90)
GFR calc non Af Amer: 68 mL/min — ABNORMAL LOW (ref >90)
Glucose, Bld: 110 mg/dL — ABNORMAL HIGH (ref 70–99)
Potassium: 4.3 mEq/L (ref 3.7–5.3)
Sodium: 134 mEq/L — ABNORMAL LOW (ref 137–147)
Total Bilirubin: 0.2 mg/dL — ABNORMAL LOW (ref 0.3–1.2)
Total Protein: 6.6 g/dL (ref 6.0–8.3)

## 2013-07-31 LAB — CBC WITH DIFFERENTIAL/PLATELET
Basophils Absolute: 0 10*3/uL (ref 0.0–0.1)
Basophils Relative: 0 % (ref 0–1)
Eosinophils Absolute: 0 10*3/uL (ref 0.0–0.7)
Eosinophils Relative: 0 % (ref 0–5)
HCT: 38.9 % (ref 36.0–46.0)
Hemoglobin: 12.8 g/dL (ref 12.0–15.0)
Lymphocytes Relative: 6 % — ABNORMAL LOW (ref 12–46)
Lymphs Abs: 0.7 10*3/uL (ref 0.7–4.0)
MCH: 29.7 pg (ref 26.0–34.0)
MCHC: 32.9 g/dL (ref 30.0–36.0)
MCV: 90.3 fL (ref 78.0–100.0)
Monocytes Absolute: 0.7 10*3/uL (ref 0.1–1.0)
Monocytes Relative: 7 % (ref 3–12)
Neutro Abs: 9.3 10*3/uL — ABNORMAL HIGH (ref 1.7–7.7)
Neutrophils Relative %: 87 % — ABNORMAL HIGH (ref 43–77)
Platelets: 275 10*3/uL (ref 150–400)
RBC: 4.31 MIL/uL (ref 3.87–5.11)
RDW: 16.2 % — ABNORMAL HIGH (ref 11.5–15.5)
WBC: 10.8 10*3/uL — ABNORMAL HIGH (ref 4.0–10.5)

## 2013-07-31 LAB — CG4 I-STAT (LACTIC ACID): Lactic Acid, Venous: 0.85 mmol/L (ref 0.5–2.2)

## 2013-07-31 LAB — URINE MICROSCOPIC-ADD ON

## 2013-07-31 LAB — URINALYSIS, ROUTINE W REFLEX MICROSCOPIC
Bilirubin Urine: NEGATIVE
Glucose, UA: NEGATIVE mg/dL
Hgb urine dipstick: NEGATIVE
Ketones, ur: NEGATIVE mg/dL
Nitrite: NEGATIVE
Protein, ur: 30 mg/dL — AB
Specific Gravity, Urine: 1.025 (ref 1.005–1.030)
Urobilinogen, UA: 0.2 mg/dL (ref 0.0–1.0)
pH: 5.5 (ref 5.0–8.0)

## 2013-07-31 LAB — PROCALCITONIN: Procalcitonin: 0.32 ng/mL

## 2013-07-31 LAB — LACTIC ACID, PLASMA: Lactic Acid, Venous: 1.2 mmol/L (ref 0.5–2.2)

## 2013-07-31 LAB — PRO B NATRIURETIC PEPTIDE: Pro B Natriuretic peptide (BNP): 1242 pg/mL — ABNORMAL HIGH (ref 0–125)

## 2013-07-31 MED ORDER — ASPIRIN EC 81 MG PO TBEC
81.0000 mg | DELAYED_RELEASE_TABLET | Freq: Every day | ORAL | Status: DC
  Administered 2013-08-01 – 2013-08-06 (×6): 81 mg via ORAL
  Filled 2013-07-31 (×6): qty 1

## 2013-07-31 MED ORDER — COLESTIPOL HCL 1 G PO TABS
1.5000 g | ORAL_TABLET | Freq: Every day | ORAL | Status: DC
  Administered 2013-08-01 – 2013-08-03 (×3): 1.5 g via ORAL
  Filled 2013-07-31 (×5): qty 2

## 2013-07-31 MED ORDER — OSELTAMIVIR PHOSPHATE 75 MG PO CAPS: 75.0000 mg | ORAL_CAPSULE | Freq: Two times a day (BID) | ORAL | Status: DC

## 2013-07-31 MED ORDER — MIRTAZAPINE 15 MG PO TABS
15.0000 mg | ORAL_TABLET | Freq: Every day | ORAL | Status: DC
  Administered 2013-08-01 – 2013-08-05 (×6): 15 mg via ORAL
  Filled 2013-07-31 (×7): qty 1

## 2013-07-31 MED ORDER — FLUOXETINE HCL 20 MG PO TABS
20.0000 mg | ORAL_TABLET | Freq: Every day | ORAL | Status: DC
  Administered 2013-08-01 – 2013-08-06 (×6): 20 mg via ORAL
  Filled 2013-07-31 (×6): qty 1

## 2013-07-31 MED ORDER — PIPERACILLIN-TAZOBACTAM 3.375 G IVPB
3.3750 g | Freq: Three times a day (TID) | INTRAVENOUS | Status: DC
  Administered 2013-08-01 – 2013-08-03 (×8): 3.375 g via INTRAVENOUS
  Filled 2013-07-31 (×10): qty 50

## 2013-07-31 MED ORDER — HYDROCOD POLST-CHLORPHEN POLST 10-8 MG/5ML PO LQCR: 5.0000 mL | Freq: Two times a day (BID) | ORAL | Status: DC | PRN

## 2013-07-31 MED ORDER — TRAMADOL HCL 50 MG PO TABS
50.0000 mg | ORAL_TABLET | Freq: Four times a day (QID) | ORAL | Status: DC | PRN
  Administered 2013-08-02 – 2013-08-03 (×3): 50 mg via ORAL
  Filled 2013-07-31 (×3): qty 1

## 2013-07-31 MED ORDER — DEXTROSE 5 % IV SOLN
1.0000 g | INTRAVENOUS | Status: DC
  Filled 2013-07-31: qty 10

## 2013-07-31 MED ORDER — MONTELUKAST SODIUM 10 MG PO TABS
10.0000 mg | ORAL_TABLET | Freq: Every day | ORAL | Status: DC
  Administered 2013-08-01 – 2013-08-05 (×6): 10 mg via ORAL
  Filled 2013-07-31 (×7): qty 1

## 2013-07-31 MED ORDER — COLESTIPOL HCL 1 G PO TABS: 1.5000 g | ORAL_TABLET | Freq: Every day | ORAL | Status: DC

## 2013-07-31 MED ORDER — VANCOMYCIN HCL IN DEXTROSE 750-5 MG/150ML-% IV SOLN
750.0000 mg | Freq: Two times a day (BID) | INTRAVENOUS | Status: DC
  Administered 2013-08-01 – 2013-08-02 (×4): 750 mg via INTRAVENOUS
  Filled 2013-07-31 (×6): qty 150

## 2013-07-31 MED ORDER — BENZONATATE 100 MG PO CAPS
100.0000 mg | ORAL_CAPSULE | Freq: Four times a day (QID) | ORAL | Status: DC | PRN
  Filled 2013-07-31: qty 1

## 2013-07-31 MED ORDER — SIMVASTATIN 20 MG PO TABS
20.0000 mg | ORAL_TABLET | Freq: Every day | ORAL | Status: DC
  Administered 2013-08-01 – 2013-08-05 (×5): 20 mg via ORAL
  Filled 2013-07-31 (×6): qty 1

## 2013-07-31 MED ORDER — OSELTAMIVIR PHOSPHATE 75 MG PO CAPS
75.0000 mg | ORAL_CAPSULE | Freq: Two times a day (BID) | ORAL | Status: AC
  Administered 2013-08-01 – 2013-08-05 (×10): 75 mg via ORAL
  Filled 2013-07-31 (×10): qty 1

## 2013-07-31 MED ORDER — SODIUM CHLORIDE 0.9 % IV SOLN
INTRAVENOUS | Status: DC
  Administered 2013-08-01: 75 mL/h via INTRAVENOUS
  Administered 2013-08-02: 22:00:00 via INTRAVENOUS
  Administered 2013-08-03: 20 mL/h via INTRAVENOUS

## 2013-07-31 MED ORDER — PANTOPRAZOLE SODIUM 40 MG PO TBEC
40.0000 mg | DELAYED_RELEASE_TABLET | Freq: Every day | ORAL | Status: DC
Start: 2013-08-01 — End: 2013-08-06
  Administered 2013-08-01 – 2013-08-06 (×6): 40 mg via ORAL
  Filled 2013-07-31 (×6): qty 1

## 2013-07-31 MED ORDER — SODIUM CHLORIDE 0.9 % IV BOLUS (SEPSIS)
1000.0000 mL | Freq: Once | INTRAVENOUS | Status: AC
  Administered 2013-07-31: 1000 mL via INTRAVENOUS

## 2013-07-31 MED ORDER — HYDROCOD POLST-CHLORPHEN POLST 10-8 MG/5ML PO LQCR
5.0000 mL | Freq: Two times a day (BID) | ORAL | Status: DC | PRN
  Administered 2013-08-01: 5 mL via ORAL
  Filled 2013-07-31: qty 5

## 2013-07-31 MED ORDER — CLOTRIMAZOLE 10 MG MT TROC: 10.0000 mg | Freq: Every day | OROMUCOSAL | Status: DC

## 2013-07-31 MED ORDER — VANCOMYCIN HCL 10 G IV SOLR
1500.0000 mg | Freq: Once | INTRAVENOUS | Status: AC
  Administered 2013-08-01: 1500 mg via INTRAVENOUS
  Filled 2013-07-31: qty 1500

## 2013-07-31 MED ORDER — ENOXAPARIN SODIUM 40 MG/0.4ML ~~LOC~~ SOLN
40.0000 mg | Freq: Every day | SUBCUTANEOUS | Status: DC
  Administered 2013-08-01 – 2013-08-06 (×6): 40 mg via SUBCUTANEOUS
  Filled 2013-07-31 (×6): qty 0.4

## 2013-07-31 MED ORDER — ALBUTEROL SULFATE (2.5 MG/3ML) 0.083% IN NEBU
2.5000 mg | INHALATION_SOLUTION | RESPIRATORY_TRACT | Status: DC | PRN
  Filled 2013-07-31: qty 3

## 2013-07-31 MED ORDER — COLESTIPOL HCL 1 G PO TABS
0.5000 g | ORAL_TABLET | Freq: Every day | ORAL | Status: DC
  Administered 2013-08-01 – 2013-08-03 (×3): 0.5 g via ORAL
  Filled 2013-07-31 (×4): qty 1

## 2013-07-31 MED ORDER — DEXTROSE 5 % IV SOLN
1.0000 g | Freq: Once | INTRAVENOUS | Status: AC
  Administered 2013-07-31: 1 g via INTRAVENOUS
  Filled 2013-07-31: qty 10

## 2013-07-31 MED ORDER — DOXYCYCLINE HYCLATE 100 MG IV SOLR
100.0000 mg | Freq: Once | INTRAVENOUS | Status: AC
  Administered 2013-07-31: 100 mg via INTRAVENOUS
  Filled 2013-07-31: qty 100

## 2013-07-31 MED ORDER — ALPRAZOLAM 0.5 MG PO TABS
1.0000 mg | ORAL_TABLET | Freq: Every evening | ORAL | Status: DC | PRN
  Administered 2013-08-01 – 2013-08-04 (×3): 1 mg via ORAL
  Filled 2013-07-31 (×4): qty 2

## 2013-07-31 NOTE — Telephone Encounter (Signed)
I ATC home number x3, no answer no voicemail. I LM on cell # to call back asap. Per CY pt needs to come in and see TP today. Per TP pt needs to come in as soon as possible. [place] Bing, CMA

## 2013-07-31 NOTE — ED Notes (Signed)
Pt sent from PCP post xray that showed bilateral PNA. Pt reports malaise and SOB x 1 month.

## 2013-07-31 NOTE — Assessment & Plan Note (Addendum)
Acute Bronchitis - ? Associated Influenza .  Check cxr today  Dr. Erin Sons w/ pt assessment-below plan agreed upon Patient is aware that if symptoms are not improving or worsens that she may need hospitalization. She is to call her office sooner or seek emergency room care.  Plan  Finish Augmentin and prednisone as directed. Mucinex DM twice daily as needed. For cough and congestion. Tamiflu 75 mg twice daily for 5 days. May use Tussionex 1 teaspoon every 12 hours as needed. For cough, may make you sleepy Fluids and rest. Tylenol as needed. Advance activity as tolerated. I will call with chest x-ray results. follow up Dr. Annamaria Boots  In 3-4 weeks and As needed   Please contact office for sooner follow up if symptoms do not improve or worsen or seek emergency care

## 2013-07-31 NOTE — H&P (Signed)
PCP:  Leonard Downing, MD  Pulmonology: Dr. Annamaria Boots  Chief Complaint:  consistent cough  HPI: [name] is a 68 y.o. female   has a past medical history of Esophageal reflux; Acute asthmatic bronchitis; Rheumatoid arthritis(714.0); and Allergic rhinitis.   Presented with  3 days hx of bad cough, fever up to 101.5, fatigue, she has been so weak she has trouble ambulating normally walks with cane. Denies myalgias, mild sore throat. She has hx. of asthma followed by Dr. Annamaria Boots. 2 days ago she went to PCP who gave her prednisone 20 mg and cough suppressant. Today she went to see Dr. Annamaria Boots and was given prescription for Augmentin, Tamiflu she was found to be febrile and had aCXR done that showed bilateral PNA at which point she was sent to ER. In ER patient was noted to be hypoxic down to 79 required up to 4 L of oxygen to maintain O2 saturation patient normally not on oxygen at home. Hospitalist was called for admission  Review of Systems:    Pertinent positives include: Fevers, chills, fatigue, productive cough  Constitutional:  No weight loss, night sweats, weight loss  HEENT:  No headaches, Difficulty swallowing,Tooth/dental problems,Sore throat,  No sneezing, itching, ear ache, nasal congestion, post nasal drip,  Cardio-vascular:  No chest pain, Orthopnea, PND, anasarca, dizziness, palpitations.no Bilateral lower extremity swelling  GI:  No heartburn, indigestion, abdominal pain, nausea, vomiting, diarrhea, change in bowel habits, loss of appetite, melena, blood in stool, hematemesis Resp:  no shortness of breath at rest. No dyspnea on exertion, No excess mucus,   No non-productive cough, No coughing up of blood.No change in color of mucus. No wheezing. Skin:  no rash or lesions. No jaundice GU:  no dysuria, change in color of urine, no urgency or frequency. No straining to urinate.  No flank pain.  Musculoskeletal:  No joint pain or no joint swelling. No decreased range of  motion. No back pain.  Psych:  No change in mood or affect. No depression or anxiety. No memory loss.  Neuro: no localizing neurological complaints, no tingling, no weakness, no double vision, no gait abnormality, no slurred speech, no confusion  Otherwise ROS are negative except for above, 10 systems were reviewed  Past Medical History: Past Medical History  Diagnosis Date  . Esophageal reflux   . Acute asthmatic bronchitis   . Rheumatoid arthritis(714.0)   . Allergic rhinitis    Past Surgical History  Procedure Laterality Date  . Ankle fusion  2009    left  . Back surgery    . Cholecystectomy    . Abdominal hysterectomy    . Total knee arthroplasty Bilateral      Medications: Prior to Admission medications   Medication Sig Start Date End Date Taking? Authorizing Provider  albuterol (PROAIR HFA) 108 (90 BASE) MCG/ACT inhaler Inhale 2 puffs into the lungs every 6 (six) hours as needed for wheezing or shortness of breath. 11/08/12  Yes Deneise Lever, MD  ALPRAZolam Duanne Moron) 1 MG tablet Take 1 mg by mouth at bedtime as needed for sleep.   Yes Historical Provider, MD  amoxicillin-clavulanate (AUGMENTIN) 875-125 MG per tablet Take 1 tablet by mouth 2 (two) times daily. 07/29/13  Yes Deneise Lever, MD  aspirin EC 81 MG tablet Take 81 mg by mouth daily.   Yes Historical Provider, MD  benzonatate (TESSALON) 100 MG capsule Take 100 mg by mouth 4 (four) times daily as needed for cough.    Yes Historical  Provider, MD  clotrimazole (MYCELEX) 10 MG troche Take 10 mg by mouth daily.    Yes Historical Provider, MD  colestipol (COLESTID) 1 G tablet Take 1.5 g by mouth daily. Take 1.5 tabs in am and 0.5 tabs at dinner   Yes Historical Provider, MD  ergocalciferol (VITAMIN D2) 50000 UNITS capsule Take 50,000 Units by mouth every 14 (fourteen) days.     Yes Historical Provider, MD  esomeprazole (NEXIUM) 40 MG capsule Take 40 mg by mouth every evening.   Yes Historical Provider, MD  FLUoxetine  (PROZAC) 20 MG tablet Take 20 mg by mouth daily.     Yes Historical Provider, MD  fluticasone (FLONASE) 50 MCG/ACT nasal spray Place 2 sprays into the nose daily.    Yes Historical Provider, MD  furosemide (LASIX) 20 MG tablet Take 40 mg by mouth 2 (two) times daily.  12/27/10  Yes Historical Provider, MD  glycopyrrolate (ROBINUL) 1 MG tablet Take 1 mg by mouth 3 (three) times daily as needed (for IBS).    Yes Historical Provider, MD  HYDROcodone-acetaminophen (NORCO) 10-325 MG per tablet Take 1 tablet by mouth every 6 (six) hours as needed for pain.  09/13/12  Yes Historical Provider, MD  Menthol, Topical Analgesic, (BLUE-EMU MAXIMUM STRENGTH EX) Apply 1 application topically daily as needed (for hip, back pain).   Yes Historical Provider, MD  mirtazapine (REMERON) 15 MG tablet Take 15 mg by mouth at bedtime. 04/09/13  Yes Historical Provider, MD  montelukast (SINGULAIR) 10 MG tablet Take 10 mg by mouth at bedtime.   Yes Historical Provider, MD  potassium chloride SA (K-DUR,KLOR-CON) 20 MEQ tablet Take 20 mEq by mouth 3 (three) times daily.   Yes Historical Provider, MD  pravastatin (PRAVACHOL) 40 MG tablet Take 40 mg by mouth daily.  12/07/11  Yes Historical Provider, MD  predniSONE (DELTASONE) 10 MG tablet 2 tabs daily as directed by Dr Arelia Sneddon   Yes Historical Provider, MD  promethazine-codeine [facility] WITH CODEINE) 6.25-10 MG/5ML syrup Take 5 mLs by mouth every 6 (six) hours as needed for cough. 07/29/13  Yes Deneise Lever, MD  traMADol (ULTRAM) 50 MG tablet Take 50 mg by mouth every 8 (eight) hours as needed for pain.   Yes Historical Provider, MD  chlorpheniramine-HYDROcodone (TUSSIONEX PENNKINETIC ER) 10-8 MG/5ML LQCR Take 5 mLs by mouth every 12 (twelve) hours as needed for cough. 07/31/13   Tammy S Parrett, NP  oseltamivir (TAMIFLU) 75 MG capsule Take 1 capsule (75 mg total) by mouth 2 (two) times daily. 07/31/13   Melvenia Needles, NP    Allergies:   Allergies  Allergen Reactions  .  Lactose Intolerance (Gi) Diarrhea  . Other Diarrhea    "lettuce only"  . Tdap [Diphth-Acell Pertussis-Tetanus]     Shaking uncontrollably  . Erythromycin Other (See Comments)    Diarrhea   . Tetanus Toxoids Other (See Comments)    Shaking uncontrollable      Social History:  Ambulatory with  cane Lives at  Home with family   reports that she has never smoked. She does not have any smokeless tobacco history on file. She reports that she drinks alcohol. She reports that she does not use illicit drugs.   Family History: family history includes Arthritis in her mother; Diabetes in her other; Heart attack in her father and other; Heart disease in her mother.    Physical Exam: Patient Vitals for the past 24 hrs:  BP Temp Temp src Pulse Resp SpO2  07/31/13  2030 102/65 mmHg - - 77 18 97 %  07/31/13 2000 103/52 mmHg - - 77 25 97 %  07/31/13 1900 104/63 mmHg - - 78 25 98 %  07/31/13 1826 111/62 mmHg 98.5 F (36.9 C) Oral 84 19 97 %  07/31/13 1800 117/68 mmHg - - 88 26 98 %  07/31/13 1654 - - - - - 94 %  07/31/13 1654 - - - - - 95 %  07/31/13 1653 - - - - - 90 %  07/31/13 1652 - - - - - 79 %  07/31/13 1602 - - - - - 91 %  07/31/13 1558 144/120 mmHg 99.6 F (37.6 C) Oral 116 18 84 %    1. General:  in No Acute distress 2. Psychological: Alert and  Oriented 3. Head/ENT:   Moist   Mucous Membranes                          Head Non traumatic, neck supple                          Normal   Dentition 4. SKIN: normal   Skin turgor,  Skin clean Dry and intact no rash scarring of her left knee 5. Heart: Regular rate and rhythm no Murmur, Rub or gallop 6. Lungs: Extensive wheezes some crackles  improved with coughing persistent at the bases 7. Abdomen: Soft, non-tender, Non distended 8. Lower extremities: no clubbing, cyanosis, or edema 9. Neurologically Grossly intact, moving all 4 extremities equally 10. MSK: Normal range of motion  body mass index is unknown because there is no  weight on file.   Labs on Admission:   Recent Labs  07/31/13 1609  NA 134*  K 4.3  CL 103  CO2 17*  GLUCOSE 110*  BUN 25*  CREATININE 0.85  CALCIUM 9.6    Recent Labs  07/31/13 1609  AST 38*  ALT 20  ALKPHOS 84  BILITOT 0.2*  PROT 6.6  ALBUMIN 2.9*   No results found for this basename: LIPASE, AMYLASE,  in the last 72 hours  Recent Labs  07/31/13 1609  WBC 10.8*  NEUTROABS 9.3*  HGB 12.8  HCT 38.9  MCV 90.3  PLT 275   No results found for this basename: CKTOTAL, CKMB, CKMBINDEX, TROPONINI,  in the last 72 hours No results found for this basename: TSH, T4TOTAL, FREET3, T3FREE, THYROIDAB,  in the last 72 hours No results found for this basename: VITAMINB12, FOLATE, FERRITIN, TIBC, IRON, RETICCTPCT,  in the last 72 hours Lab Results  Component Value Date   HGBA1C 6.0* 11/15/2012    The CrCl is unknown because both a height and weight (above a minimum accepted value) are required for this calculation. ABG No results found for this basename: phart, pco2, po2, hco3, tco2, acidbasedef, o2sat     No results found for this basename: DDIMER     Other results:  I have pearsonaly reviewed this: ECG REPORT  Rate: 85  Rhythm: SR ST&T Change: no ischemia  UA 3-6 WBC in Urine  Cultures: No results found for this basename: sdes, specrequest, cult, reptstatus       Radiological Exams on Admission: Dg Chest 2 View  07/31/2013   CLINICAL DATA:  68 year old female with cough shortness of Breath chest pain and fever. Initial encounter.  EXAM: CHEST  2 VIEW  COMPARISON:  09/10/2012 and earlier.  FINDINGS: Bilateral perihilar and confluent  pulmonary opacity, maximal in the left lower lobe. Stable lung volumes. Stable cardiac size and mediastinal contours. Visualized tracheal air column is within normal limits. No pneumothorax. No pleural effusion identified. No acute osseous abnormality identified. Partially visible lumbar fusion hardware.  IMPRESSION: Confluent  pulmonary opacity, nonspecific but most compatible with bilateral pneumonia in this setting. Post treatment radiographs recommended to document resolution.   Electronically Signed   By: Lars Pinks M.D.   On: 07/31/2013 13:01    Chart has been reviewed  Assessment/Plan 68 year old female with history of asthma presents with cough was found to have bilateral pulmonary infiltrates worrisome for pneumonia  Present on Admission:  . CAP (community acquired pneumonia) - given hypoxia history of chronic asthma and bilateral infiltrates will admit to step down broad-spectrum antibiotics given history of pulmonary disease. Albuterol when necessary continue Tamiflu for now until influenza PCR is negative. Pulmonology consult in a.m.  Marland Kitchen Hypoxia - improved with oxygen, most likely due to pneumonia  . ASTHMATIC BRONCHITIS, ACUTE - will hold off on steroids given likely infectious source SIRS - fever hypoxia and somewhat soft blood pressure still give broad-spectrum antibiotics hold Lasix give IV fluids. Obtain lactic acid and Procalcitonin level. Blood and sputum culture  Prophylaxis:   Lovenox, Protonix  CODE STATUS: Patient wishes to be full code  Other plan as per orders.  I have spent a total of 65 min on this admission, patient was discussed with Pulmonology who will see her in consult  Merrill 07/31/2013, 10:04 PM

## 2013-07-31 NOTE — Patient Instructions (Signed)
Finish Augmentin and prednisone as directed. Mucinex DM twice daily as needed. For cough and congestion. Tamiflu 75 mg twice daily for 5 days. May use Tussionex 1 teaspoon every 12 hours as needed. For cough, may make you sleepy Fluids and rest. Tylenol as needed. Advance activity as tolerated. I will call with chest x-ray results. follow up Dr. Annamaria Boots  In 3-4 weeks and As needed   Please contact office for sooner follow up if symptoms do not improve or worsen or seek emergency care

## 2013-07-31 NOTE — ED Provider Notes (Signed)
CSN: [id number]     Arrival date & time 07/31/13  1554 History   First MD Initiated Contact with Patient 07/31/13 1633     Chief Complaint  Patient presents with  . Shortness of Breath   (Consider location/radiation/quality/duration/timing/severity/associated sxs/prior Treatment) Patient is a 68 y.o. female presenting with shortness of breath.  Shortness of Breath Severity:  Severe Onset quality:  Gradual Duration:  3 weeks Timing:  Constant Progression:  Worsening Chronicity:  Chronic (acute-on-chronic) Associated symptoms: no abdominal pain, no chest pain, no cough, no fever, no headaches, no rash, no sore throat and no vomiting     Past Medical History  Diagnosis Date  . Esophageal reflux   . Acute asthmatic bronchitis   . Rheumatoid arthritis(714.0)   . Allergic rhinitis    Past Surgical History  Procedure Laterality Date  . Ankle fusion  2009    left  . Back surgery    . Cholecystectomy    . Abdominal hysterectomy    . Total knee arthroplasty Bilateral    Family History  Problem Relation Age of Onset  . Heart disease Mother   . Arthritis Mother   . Heart attack Father   . Diabetes Other     sibling  . Heart attack Other     sibling   History  Substance Use Topics  . Smoking status: Never Smoker   . Smokeless tobacco: Not on file     Comment: positive passive tobacco smoke exposure  . Alcohol Use: Yes     Comment: glass of wine each night    OB History   Grav Para Term Preterm Abortions TAB SAB Ect Mult Living                 Review of Systems  Constitutional: Negative for fever and chills.  HENT: Negative for sore throat.   Eyes: Negative for pain.  Respiratory: Positive for shortness of breath. Negative for cough.   Cardiovascular: Negative for chest pain.  Gastrointestinal: Negative for nausea, vomiting, abdominal pain and diarrhea.  Genitourinary: Negative for dysuria.  Musculoskeletal: Negative for back pain.  Skin: Negative for rash.   Neurological: Negative for numbness and headaches.    Allergies  Lactose intolerance (gi); Other; Tdap; Erythromycin; and Tetanus toxoids  Home Medications   Current Outpatient Rx  Name  Route  Sig  Dispense  Refill  . albuterol (PROAIR HFA) 108 (90 BASE) MCG/ACT inhaler   Inhalation   Inhale 2 puffs into the lungs every 6 (six) hours as needed for wheezing or shortness of breath.   1 Inhaler   prn   . ALPRAZolam (XANAX) 1 MG tablet   Oral   Take 1 mg by mouth at bedtime as needed for sleep.         Marland Kitchen amoxicillin-clavulanate (AUGMENTIN) 875-125 MG per tablet   Oral   Take 1 tablet by mouth 2 (two) times daily.   20 tablet   0   . aspirin EC 81 MG tablet   Oral   Take 81 mg by mouth daily.         . benzonatate (TESSALON) 100 MG capsule   Oral   Take 100 mg by mouth 4 (four) times daily as needed for cough.          . clotrimazole (MYCELEX) 10 MG troche   Oral   Take 10 mg by mouth daily.          . colestipol (COLESTID) 1 G tablet  Oral   Take 1.5 g by mouth daily. Take 1.5 tabs in am and 0.5 tabs at dinner         . ergocalciferol (VITAMIN D2) 50000 UNITS capsule   Oral   Take 50,000 Units by mouth every 14 (fourteen) days.           Marland Kitchen esomeprazole (NEXIUM) 40 MG capsule   Oral   Take 40 mg by mouth every evening.         Marland Kitchen FLUoxetine (PROZAC) 20 MG tablet   Oral   Take 20 mg by mouth daily.           . fluticasone (FLONASE) 50 MCG/ACT nasal spray   Nasal   Place 2 sprays into the nose daily.          . furosemide (LASIX) 20 MG tablet   Oral   Take 40 mg by mouth 2 (two) times daily.          Marland Kitchen glycopyrrolate (ROBINUL) 1 MG tablet   Oral   Take 1 mg by mouth 3 (three) times daily as needed (for IBS).          Marland Kitchen HYDROcodone-acetaminophen (NORCO) 10-325 MG per tablet   Oral   Take 1 tablet by mouth every 6 (six) hours as needed for pain.          . Menthol, Topical Analgesic, (BLUE-EMU MAXIMUM STRENGTH EX)   Apply  externally   Apply 1 application topically daily as needed (for hip, back pain).         . mirtazapine (REMERON) 15 MG tablet   Oral   Take 15 mg by mouth at bedtime.         . montelukast (SINGULAIR) 10 MG tablet   Oral   Take 10 mg by mouth at bedtime.         . potassium chloride SA (K-DUR,KLOR-CON) 20 MEQ tablet   Oral   Take 20 mEq by mouth 3 (three) times daily.         . pravastatin (PRAVACHOL) 40 MG tablet   Oral   Take 40 mg by mouth daily.          . predniSONE (DELTASONE) 10 MG tablet      2 tabs daily as directed by Dr Arelia Sneddon         . promethazine-codeine (PHENERGAN WITH CODEINE) 6.25-10 MG/5ML syrup   Oral   Take 5 mLs by mouth every 6 (six) hours as needed for cough.   200 mL   0   . traMADol (ULTRAM) 50 MG tablet   Oral   Take 50 mg by mouth every 8 (eight) hours as needed for pain.         . chlorpheniramine-HYDROcodone (TUSSIONEX PENNKINETIC ER) 10-8 MG/5ML LQCR   Oral   Take 5 mLs by mouth every 12 (twelve) hours as needed for cough.   120 mL   0   . oseltamivir (TAMIFLU) 75 MG capsule   Oral   Take 1 capsule (75 mg total) by mouth 2 (two) times daily.   10 capsule   0    BP 102/65  Pulse 77  Temp(Src) 98.5 F (36.9 C) (Oral)  Resp 18  SpO2 97% Physical Exam  Constitutional: She is oriented to person, place, and time. She appears well-developed and well-nourished. No distress.  HENT:  Head: Normocephalic and atraumatic.  Eyes: Pupils are equal, round, and reactive to light. Right eye exhibits no discharge. Left eye  exhibits no discharge.  Neck: Normal range of motion.  Cardiovascular: Normal rate, regular rhythm and normal heart sounds.   Pulmonary/Chest: Effort normal. She has wheezes in the right middle field and the right lower field. She has rhonchi.  Abdominal: Soft. She exhibits no distension. There is no tenderness.  Musculoskeletal: Normal range of motion.  Neurological: She is alert and oriented to person, place,  and time.  Skin: Skin is warm. She is not diaphoretic.    ED Course  Procedures (including critical care time) Labs Review Labs Reviewed  CBC WITH DIFFERENTIAL - Abnormal; Notable for the following:    WBC 10.8 (*)    RDW 16.2 (*)    Neutrophils Relative % 87 (*)    Neutro Abs 9.3 (*)    Lymphocytes Relative 6 (*)    All other components within normal limits  COMPREHENSIVE METABOLIC PANEL - Abnormal; Notable for the following:    Sodium 134 (*)    CO2 17 (*)    Glucose, Bld 110 (*)    BUN 25 (*)    Albumin 2.9 (*)    AST 38 (*)    Total Bilirubin 0.2 (*)    GFR calc non Af Amer 68 (*)    GFR calc Af Amer 79 (*)    All other components within normal limits  URINALYSIS, ROUTINE W REFLEX MICROSCOPIC - Abnormal; Notable for the following:    Protein, ur 30 (*)    Leukocytes, UA TRACE (*)    All other components within normal limits  PRO B NATRIURETIC PEPTIDE - Abnormal; Notable for the following:    Pro B Natriuretic peptide (BNP) 1242.0 (*)    All other components within normal limits  URINE MICROSCOPIC-ADD ON - Abnormal; Notable for the following:    Squamous Epithelial / LPF FEW (*)    Bacteria, UA FEW (*)    Casts GRANULAR CAST (*)    All other components within normal limits  CULTURE, BLOOD (ROUTINE X 2)  CULTURE, BLOOD (ROUTINE X 2)  URINE CULTURE  LACTIC ACID, PLASMA  PROCALCITONIN  CG4 I-STAT (LACTIC ACID)   Imaging Review Dg Chest 2 View  07/31/2013   CLINICAL DATA:  68 year old female with cough shortness of Breath chest pain and fever. Initial encounter.  EXAM: CHEST  2 VIEW  COMPARISON:  09/10/2012 and earlier.  FINDINGS: Bilateral perihilar and confluent pulmonary opacity, maximal in the left lower lobe. Stable lung volumes. Stable cardiac size and mediastinal contours. Visualized tracheal air column is within normal limits. No pneumothorax. No pleural effusion identified. No acute osseous abnormality identified. Partially visible lumbar fusion hardware.   IMPRESSION: Confluent pulmonary opacity, nonspecific but most compatible with bilateral pneumonia in this setting. Post treatment radiographs recommended to document resolution.   Electronically Signed   By: Lars Pinks M.D.   On: 07/31/2013 13:01    EKG Interpretation    Date/Time:  Wednesday July 31 2013 18:25:32 EST Ventricular Rate:  85 PR Interval:  152 QRS Duration: 87 QT Interval:  363 QTC Calculation: 432 R Axis:   8 Text Interpretation:  Sinus rhythm Low voltage, precordial leads Confirmed by DELOS  MD, DOUGLAS ([id number]) on 07/31/2013 9:12:25 PM            MDM   1. CAP (community acquired pneumonia)   2. Hypoxia   3. Asthma with acute exacerbation    Patient with hx of asthma presents as transfer from pulmonology office for possible pneumonia.   Patient tachycardic, febrile earlier.  Concern for sepsis 2/2 pneumonia. BCs drawn. IV fluids given. Antibiotics ordered, given. XR reviewed, with evidence of PNA. Labs c/w leukocytosis. Patient hypoxic on arrival (no O2 requirement at home), with improvement with 3L [place].   Will admit to hospitalist for continued IV antibiotics, oxygen therapy. Patient admitted in stable condition. Patient seen and evaluated by myself and my attending, Dr. Stark Jock.      Freddi Che, MD 07/31/13 [phone]

## 2013-07-31 NOTE — Progress Notes (Signed)
ANTIBIOTIC CONSULT NOTE - INITIAL  Pharmacy Consult for Vancomycin and Zosyn  Indication: pneumonia  Allergies  Allergen Reactions  . Lactose Intolerance (Gi) Diarrhea  . Other Diarrhea    "lettuce only"  . Tdap [Diphth-Acell Pertussis-Tetanus]     Shaking uncontrollably  . Erythromycin Other (See Comments)    Diarrhea   . Tetanus Toxoids Other (See Comments)    Shaking uncontrollable      Patient Measurements: Height: 5' 1.81" (157 cm) Weight: 171 lb 15.3 oz (78 kg) IBW/kg (Calculated) : 49.67  Vital Signs: Temp: 98.5 F (36.9 C) (01/14 1826) Temp src: Oral (01/14 1826) BP: 102/65 mmHg (01/14 2030) Pulse Rate: 77 (01/14 2030) Intake/Output from previous day:   Intake/Output from this shift:    Labs:  Recent Labs  07/31/13 1609  WBC 10.8*  HGB 12.8  PLT 275  CREATININE 0.85   Estimated Creatinine Clearance: 60.2 ml/min (by C-G formula based on Cr of 0.85). No results found for this basename: VANCOTROUGH, VANCOPEAK, VANCORANDOM, GENTTROUGH, GENTPEAK, GENTRANDOM, TOBRATROUGH, TOBRAPEAK, TOBRARND, AMIKACINPEAK, AMIKACINTROU, AMIKACIN,  in the last 72 hours   Microbiology: No results found for this or any previous visit (from the past 720 hour(s)).  Medical History: Past Medical History  Diagnosis Date  . Esophageal reflux   . Acute asthmatic bronchitis   . Rheumatoid arthritis(714.0)   . Allergic rhinitis     Medications:  Prescriptions prior to admission  Medication Sig Dispense Refill  . albuterol (PROAIR HFA) 108 (90 BASE) MCG/ACT inhaler Inhale 2 puffs into the lungs every 6 (six) hours as needed for wheezing or shortness of breath.  1 Inhaler  prn  . ALPRAZolam (XANAX) 1 MG tablet Take 1 mg by mouth at bedtime as needed for sleep.      Marland Kitchen amoxicillin-clavulanate (AUGMENTIN) 875-125 MG per tablet Take 1 tablet by mouth 2 (two) times daily.  20 tablet  0  . aspirin EC 81 MG tablet Take 81 mg by mouth daily.      . benzonatate (TESSALON) 100 MG  capsule Take 100 mg by mouth 4 (four) times daily as needed for cough.       . clotrimazole (MYCELEX) 10 MG troche Take 10 mg by mouth daily.       . colestipol (COLESTID) 1 G tablet Take 1.5 g by mouth daily. Take 1.5 tabs in am and 0.5 tabs at dinner      . ergocalciferol (VITAMIN D2) 50000 UNITS capsule Take 50,000 Units by mouth every 14 (fourteen) days.        Marland Kitchen esomeprazole (NEXIUM) 40 MG capsule Take 40 mg by mouth every evening.      Marland Kitchen FLUoxetine (PROZAC) 20 MG tablet Take 20 mg by mouth daily.        . fluticasone (FLONASE) 50 MCG/ACT nasal spray Place 2 sprays into the nose daily.       . furosemide (LASIX) 20 MG tablet Take 40 mg by mouth 2 (two) times daily.       Marland Kitchen glycopyrrolate (ROBINUL) 1 MG tablet Take 1 mg by mouth 3 (three) times daily as needed (for IBS).       Marland Kitchen HYDROcodone-acetaminophen (NORCO) 10-325 MG per tablet Take 1 tablet by mouth every 6 (six) hours as needed for pain.       . Menthol, Topical Analgesic, (BLUE-EMU MAXIMUM STRENGTH EX) Apply 1 application topically daily as needed (for hip, back pain).      . mirtazapine (REMERON) 15 MG tablet Take 15 mg  by mouth at bedtime.      . montelukast (SINGULAIR) 10 MG tablet Take 10 mg by mouth at bedtime.      . potassium chloride SA (K-DUR,KLOR-CON) 20 MEQ tablet Take 20 mEq by mouth 3 (three) times daily.      . pravastatin (PRAVACHOL) 40 MG tablet Take 40 mg by mouth daily.       . predniSONE (DELTASONE) 10 MG tablet 2 tabs daily as directed by Dr Arelia Sneddon      . promethazine-codeine (PHENERGAN WITH CODEINE) 6.25-10 MG/5ML syrup Take 5 mLs by mouth every 6 (six) hours as needed for cough.  200 mL  0  . traMADol (ULTRAM) 50 MG tablet Take 50 mg by mouth every 8 (eight) hours as needed for pain.      . chlorpheniramine-HYDROcodone (TUSSIONEX PENNKINETIC ER) 10-8 MG/5ML LQCR Take 5 mLs by mouth every 12 (twelve) hours as needed for cough.  120 mL  0  . oseltamivir (TAMIFLU) 75 MG capsule Take 1 capsule (75 mg total) by mouth 2  (two) times daily.  10 capsule  0   Assessment: 68 yo female with cough/fever for empiric antibiotics  Goal of Therapy:  Vancomycin trough level 15-20 mcg/ml  Plan:  Vancomcyin 1500 mg IV now, then 750 mg IV q12h Zosyn 3.375 g IV q8h  Caryl Pina 07/31/2013,11:40 PM

## 2013-07-31 NOTE — Consult Note (Signed)
Name: [name] MRN: [id number] DOB: 07-Mar-1946    ADMISSION DATE:  07/31/2013 CONSULTATION DATE:  07/31/13  REFERRING MD :  Dr. Roel Cluck / [facility] PRIMARY SERVICE:  [facility]  CHIEF COMPLAINT:  Cough, fever, fatigue  BRIEF PATIENT DESCRIPTION: [name] admitted on 1/14 w concern for CAP, acute asthma exacerbation.  PCCM consulted for pulmonary evaluation.   SIGNIFICANT EVENTS / STUDIES:  1/14 - Admit with acute asthma exacerbation, concern for CAP  CULTURES: BCx2 1/14>>> UC 1/14>>> Sputum 1/14>>> Resp viral 1/14>>> UA 1/14>>>3-6 wbc, few bacteria, neg nitrate  ANTIBIOTICS: Vanco 1/14>>> Zosyn 1/14>>>  HISTORY OF PRESENT ILLNESS:  [name] with a PMH of allergies, asthma, GERD and RA (on intermittent prednisone) who presented to [facility] ER on 1/14 with a two week history of feeling poorly.  She reports her husband has been ill.  They visited their PCP ion 1/12 and she was placed on pulse prednisone, augmentin and cough syrup.  She continued to have cough with creamy yellow sputum production, post-nasal gtt, SOB, fevers, chills and body aches.  She did not have any improvemnt and visited the pulmonary office on 1/14.  CXR evaluated with concern for bilateral airspace disease.  She was treated with tamiflu & tussionex and instructed to finish current antibiotic regimen / prednisone.  Unfortunately, patient continued to feel worse, returning to ER on the evening of 1/14.  On ER arrival, she was noted to have saturations of 79% on RA. Pt was placed on 4L of oxygen with improvement of saturations.   Patient denies headaches, syncope, pre-syncope, sore throat, hemoptysis, chest pain, pain with inspiration, n/v/d.    PAST MEDICAL HISTORY :  Past Medical History  Diagnosis Date  . Esophageal reflux   . Acute asthmatic bronchitis   . Rheumatoid arthritis(714.0)   . Allergic rhinitis    Past Surgical History  Procedure Laterality Date  . Ankle fusion  2009    left  . Back surgery    .  Cholecystectomy    . Abdominal hysterectomy    . Total knee arthroplasty Bilateral    Prior to Admission medications   Medication Sig Start Date End Date Taking? Authorizing Provider  albuterol (PROAIR HFA) 108 (90 BASE) MCG/ACT inhaler Inhale 2 puffs into the lungs every 6 (six) hours as needed for wheezing or shortness of breath. 11/08/12  Yes Deneise Lever, MD  ALPRAZolam Duanne Moron) 1 MG tablet Take 1 mg by mouth at bedtime as needed for sleep.   Yes Historical Provider, MD  amoxicillin-clavulanate (AUGMENTIN) 875-125 MG per tablet Take 1 tablet by mouth 2 (two) times daily. 07/29/13  Yes Deneise Lever, MD  aspirin EC 81 MG tablet Take 81 mg by mouth daily.   Yes Historical Provider, MD  benzonatate (TESSALON) 100 MG capsule Take 100 mg by mouth 4 (four) times daily as needed for cough.    Yes Historical Provider, MD  clotrimazole (MYCELEX) 10 MG troche Take 10 mg by mouth daily.    Yes Historical Provider, MD  colestipol (COLESTID) 1 G tablet Take 1.5 g by mouth daily. Take 1.5 tabs in am and 0.5 tabs at dinner   Yes Historical Provider, MD  ergocalciferol (VITAMIN D2) 50000 UNITS capsule Take 50,000 Units by mouth every 14 (fourteen) days.     Yes Historical Provider, MD  esomeprazole (NEXIUM) 40 MG capsule Take 40 mg by mouth every evening.   Yes Historical Provider, MD  FLUoxetine (PROZAC) 20 MG tablet Take 20 mg  by mouth daily.     Yes Historical Provider, MD  fluticasone (FLONASE) 50 MCG/ACT nasal spray Place 2 sprays into the nose daily.    Yes Historical Provider, MD  furosemide (LASIX) 20 MG tablet Take 40 mg by mouth 2 (two) times daily.  12/27/10  Yes Historical Provider, MD  glycopyrrolate (ROBINUL) 1 MG tablet Take 1 mg by mouth 3 (three) times daily as needed (for IBS).    Yes Historical Provider, MD  HYDROcodone-acetaminophen (NORCO) 10-325 MG per tablet Take 1 tablet by mouth every 6 (six) hours as needed for pain.  09/13/12  Yes Historical Provider, MD  Menthol, Topical  Analgesic, (BLUE-EMU MAXIMUM STRENGTH EX) Apply 1 application topically daily as needed (for hip, back pain).   Yes Historical Provider, MD  mirtazapine (REMERON) 15 MG tablet Take 15 mg by mouth at bedtime. 04/09/13  Yes Historical Provider, MD  montelukast (SINGULAIR) 10 MG tablet Take 10 mg by mouth at bedtime.   Yes Historical Provider, MD  potassium chloride SA (K-DUR,KLOR-CON) 20 MEQ tablet Take 20 mEq by mouth 3 (three) times daily.   Yes Historical Provider, MD  pravastatin (PRAVACHOL) 40 MG tablet Take 40 mg by mouth daily.  12/07/11  Yes Historical Provider, MD  predniSONE (DELTASONE) 10 MG tablet 2 tabs daily as directed by Dr Arelia Sneddon   Yes Historical Provider, MD  promethazine-codeine [facility] WITH CODEINE) 6.25-10 MG/5ML syrup Take 5 mLs by mouth every 6 (six) hours as needed for cough. 07/29/13  Yes Deneise Lever, MD  traMADol (ULTRAM) 50 MG tablet Take 50 mg by mouth every 8 (eight) hours as needed for pain.   Yes Historical Provider, MD  chlorpheniramine-HYDROcodone (TUSSIONEX PENNKINETIC ER) 10-8 MG/5ML LQCR Take 5 mLs by mouth every 12 (twelve) hours as needed for cough. 07/31/13   Tammy S Parrett, NP  oseltamivir (TAMIFLU) 75 MG capsule Take 1 capsule (75 mg total) by mouth 2 (two) times daily. 07/31/13   Melvenia Needles, NP   Allergies  Allergen Reactions  . Lactose Intolerance (Gi) Diarrhea  . Other Diarrhea    "lettuce only"  . Tdap [Diphth-Acell Pertussis-Tetanus]     Shaking uncontrollably  . Erythromycin Other (See Comments)    Diarrhea   . Tetanus Toxoids Other (See Comments)    Shaking uncontrollable      FAMILY HISTORY:  Family History  Problem Relation Age of Onset  . Heart disease Mother   . Arthritis Mother   . Heart attack Father   . Diabetes Other     sibling  . Heart attack Other     sibling   SOCIAL HISTORY:  reports that she has never smoked. She does not have any smokeless tobacco history on file. She reports that she drinks alcohol. She  reports that she does not use illicit drugs.  REVIEW OF SYSTEMS:  See HPI for pertinent positives.  All other systems reviewed and otherwise negative.   SUBJECTIVE: Pt reports feeling exhausted, wants to sleep and feel better.   VITAL SIGNS: Temp:  [98.5 F (36.9 C)-101.5 F (38.6 C)] 98.5 F (36.9 C) (01/14 1826) Pulse Rate:  [77-116] 77 (01/14 2030) Resp:  [18-26] 18 (01/14 2030) BP: (102-144)/(52-120) 102/65 mmHg (01/14 2030) SpO2:  [79 %-98 %] 97 % (01/14 2030) Weight:  [172 lb (78.019 kg)] 172 lb (78.019 kg) (01/14 1157)  PHYSICAL EXAMINATION: General:  wdwn adult female Neuro:  AAOx4, speech clear, MAE HEENT:  Mm pink/moist, no jvd Cardiovascular:  s1s2 rrr, no m/r/g Lungs:  resp's even/non-labored, lungs bilaterally with exp wheeze, coarse breath sounds, no crackles / rhonchi Abdomen:  Round/soft, bsx4 active Musculoskeletal:  No acute deformity, pronounced joints in hands Skin:  Warm/dry, no edema   Recent Labs Lab 07/31/13 1609  NA 134*  K 4.3  CL 103  CO2 17*  BUN 25*  CREATININE 0.85  GLUCOSE 110*    Recent Labs Lab 07/31/13 1609  HGB 12.8  HCT 38.9  WBC 10.8*  PLT 275   Dg Chest 2 View  07/31/2013   CLINICAL DATA:  68 year old female with cough shortness of Breath chest pain and fever. Initial encounter.  EXAM: CHEST  2 VIEW  COMPARISON:  09/10/2012 and earlier.  FINDINGS: Bilateral perihilar and confluent pulmonary opacity, maximal in the left lower lobe. Stable lung volumes. Stable cardiac size and mediastinal contours. Visualized tracheal air column is within normal limits. No pneumothorax. No pleural effusion identified. No acute osseous abnormality identified. Partially visible lumbar fusion hardware.  IMPRESSION: Confluent pulmonary opacity, nonspecific but most compatible with bilateral pneumonia in this setting. Post treatment radiographs recommended to document resolution.   Electronically Signed   By: Lars Pinks M.D.   On: 07/31/2013 13:01     ASSESSMENT / PLAN:  CAP - concern for R>L airspace disease  Acute Asthma Exacerbation  R/O Influenza  Plan: -empiric tamiflu -continue current abx with immune compromised status (intermittant pred) -follow blood, sputum, urine studies -PCT protocol, narrow abx as able -pulmonary hygiene -droplet precautions -oxygen to support sats > 93% -gentle hydration -repeat 2V CXR in am  Noe Gens, NP-C [facility] Pgr: [phone] or [phone]    07/31/2013, 11:08 PM  PCCM ATTENDING: I have interviewed and examined the patient and reviewed the database. I have formulated the assessment and plan as reflected in the note above with amendments made by me.    Merton Border, MD;  PCCM service; Mobile [phone]

## 2013-07-31 NOTE — ED Notes (Signed)
The pt was sent here today from dr c youngs office with double pneumonia.  She has had numersu symptoms since just before christmas.  Today she went with more difficulty breathing expecially at night and wheezes.  She was diagnosed with a chest xray no blood and sent here to be treated.  Alert no distress.  sats low.  No home 02

## 2013-07-31 NOTE — Telephone Encounter (Signed)
Appt set for [place], [place]

## 2013-07-31 NOTE — Telephone Encounter (Signed)
Spoke with pt. She c/o constant cough-prod w/ yellow-green phlem, chest congestion, fever of 100.1, chills, sweats, wheezing, chest tx, feels very SOB. She is using proair, still is on Augmentin and using the promethazine cough syrup (called in 07/29/13). Also takes mucinex DM. Denies any body aches, no nausea, no vomiting. She reports she feels miserable. Pt is requesting to be seen today. Nothing available. Please advise Dr. Annamaria Boots thanks  Allergies  Allergen Reactions  . Lactose Intolerance (Gi) Diarrhea  . Other Diarrhea    "lettuce only"  . Tdap [Diphth-Acell Pertussis-Tetanus]     Shaking uncontrollably  . Erythromycin Other (See Comments)    Diarrhea   . Tetanus Toxoids Other (See Comments)    Shaking uncontrollable      Current Outpatient Prescriptions on File Prior to Visit  Medication Sig Dispense Refill  . albuterol (PROAIR HFA) 108 (90 BASE) MCG/ACT inhaler Inhale 2 puffs into the lungs every 6 (six) hours as needed for wheezing or shortness of breath.  1 Inhaler  prn  . ALPRAZolam (XANAX) 1 MG tablet Take 1 mg by mouth at bedtime as needed for sleep.      Marland Kitchen amoxicillin-clavulanate (AUGMENTIN) 875-125 MG per tablet Take 1 tablet by mouth 2 (two) times daily.  20 tablet  0  . aspirin 81 MG tablet Take 81 mg by mouth daily.      . benzonatate (TESSALON) 100 MG capsule Take 100 mg by mouth 4 (four) times daily as needed.       . cefdinir (OMNICEF) 300 MG capsule Take 1 capsule (300 mg total) by mouth 2 (two) times daily.  20 capsule  0  . celecoxib (CELEBREX) 200 MG capsule Take 200 mg by mouth daily.       . clotrimazole (MYCELEX) 10 MG troche TAKE 1 TABLET BY MOUTH 3 TIMES A DAY AS NEEDED  60 tablet  2  . colestipol (COLESTID) 1 G tablet Take 1 g by mouth daily. Take 1.5 tabs daily      . doxycycline (VIBRA-TABS) 100 MG tablet Take 2 tablets today then one tablet daily until gone  8 tablet  0  . ergocalciferol (VITAMIN D2) 50000 UNITS capsule Take 50,000 Units by mouth every 14  (fourteen) days.        Marland Kitchen esomeprazole (NEXIUM) 40 MG capsule Take 40 mg by mouth every evening.      Marland Kitchen FLUoxetine (PROZAC) 20 MG tablet Take 20 mg by mouth daily.        . fluticasone (FLONASE) 50 MCG/ACT nasal spray Place 2 sprays into the nose daily.       . furosemide (LASIX) 20 MG tablet Take 40 mg by mouth 2 (two) times daily.       Marland Kitchen glycopyrrolate (ROBINUL) 1 MG tablet Take 1 mg by mouth as needed (for IBS-can take up to TID if needed).       Marland Kitchen HYDROcodone-acetaminophen (NORCO) 10-325 MG per tablet Take by mouth every 6 (six) hours as needed for pain.       Marland Kitchen ipratropium (ATROVENT) 0.03 % nasal spray USE 1-2 SPRAYS EACH NOSTRIL 3 TIMES DAILY AS NEEDED  30 mL  5  . Menthol, Topical Analgesic, (BLUE-EMU MAXIMUM STRENGTH EX) Apply 1 application topically daily as needed (for hip, back pain).      . mirtazapine (REMERON) 15 MG tablet Take 15 mg by mouth at bedtime.      . montelukast (SINGULAIR) 10 MG tablet TAKE 1 TABLET (10 MG TOTAL) BY MOUTH  AT BEDTIME.  90 tablet  1  . pravastatin (PRAVACHOL) 40 MG tablet Take 40 mg by mouth daily.       . predniSONE (DELTASONE) 10 MG tablet Take 10 mg by mouth. Take 1 1/2 tablets every day-taper.      . promethazine-codeine (PHENERGAN WITH CODEINE) 6.25-10 MG/5ML syrup Take 5 mLs by mouth every 6 (six) hours as needed for cough.  200 mL  0  . promethazine-codeine (PHENERGAN WITH CODEINE) 6.25-10 MG/5ML syrup Take 5 mLs by mouth every 6 (six) hours as needed for cough.  200 mL  0  . SANTYL ointment       . silver sulfADIAZINE (SILVADENE) 1 % cream Apply 1 application topically once a week.       . traMADol (ULTRAM) 50 MG tablet Take 50 mg by mouth every 8 (eight) hours as needed for pain.       No current facility-administered medications on file prior to visit.

## 2013-07-31 NOTE — Progress Notes (Signed)
Subjective:    [name], female    DOB: Apr 27, 1946, 68 y.o.   MRN: [id number]  HPI 02/03/11- 4 yoF never smoker followed for allergic rhinitis, rhinosinusitis, asthmatic bronchitis, complicated by , GERD, rheumatoid arthritis. Last here August, 3, 2011 She has has done much better since last here- maybe because she is past the turmoil of moving last year. Not needing her nebulizer recently. Rescue inhaler used less than once/ week. Talked about the death of her aged mother.  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   02/06/12- 64 yoF followed for allergic rhinitis, rhinosinusitis, asthmatic bronchitis, complicated by , GERD, rheumatoid arthritis   Follows for: takes vaccine and has some sinus drainage when she eats.  She has responded a couple of x2 a Z-Pak when needed. Overall much better since she moved to her new home 2 years ago. Uses her rescue inhaler at bedtime. Less nighttime cough she lies on her side. 5 grandchildren stay with her, which is a stress and the source of colds. Watery rhinorrhea when she is.Troches have worked well for yeast prn.   03/28/2012 Acute OV  Complains of prod cough with w/ green/yellow/white mucus, wheezing, increased SOB, chills x2.5weeks, worse x1day.  OTC not helping .  Cough is keeping her up at night.  No hemoptysis or chest pian .  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  Never smoker.   09/20/12- 66 yoF followed for allergic rhinitis, rhinosinusitis, asthmatic bronchitis, complicated by , GERD/ Crohn's, rheumatoid arthritis  Husband here. FOLLOWS FOR: still having headaches, sinus drainage, and dizzy. Was given Augmentin and cough syrup last week. Had fallen in shower in August 2013-had a hematoma and no broken bones. Now- increased SOB/ DOE x 3 weeks. Dry cough at night. Chronic globus. Mucus plug from nose.  CXR 09/10/12-  IMPRESSION:  No acute findings.  Original Report Authenticated By: Lorin Picket, M.D.  11/08/12- 66 yoF followed for allergic rhinitis, rhinosinusitis, asthmatic bronchitis, complicated by , GERD/ Crohn's, rheumatoid arthritis  Husband here. FOLLOWS JQD:UKRCV on allergy vaccine1:10 GO and doing well; no reactions. Has had flare up with allergies; for about 3 weeks now. Never really cleared completely after exacerbation at last visit when we gave cefdinir.Marland Kitchen Has had increasing cough with scant yellow green sputum starting about a week ago. Denies fever or sore throat. Occasional wheeze. Increased ankle edema. Dr. Arelia Sneddon increased her Lasix. She had fallen on ice during the winter and badly skinned her right pretibial area and left knee. We talked about being seen at a wound center. She is to see a neurologist about her falls. She complains of always feeling very tired and husband says she snores. I educated them on watching for sleep apnea. CXR 09/10/12 IMPRESSION:  No acute findings.  Original Report Authenticated By: Lorin Picket, M.D.  06/11/13-   05/10/13- 67 yoF followed for allergic rhinitis, rhinosinusitis, asthmatic bronchitis, complicated by , GERD/ Crohn's, rheumatoid arthritis  Husband here. Allergy vaccine1:10 GO FOLLOWS FOR:  Sinus pressure w/ PND and cough w/ mucus green in color x2 months.  Also, having abdominal pain and frequent urination Had flu vaccine. Describes 2 months increased sinus drainage with some green mucus, frontal headaches. No fever, ear pressure or wheezing. Still recovering from abrasion to legs when she fell last winter. Has been going to wound Center.  05/10/13- 67 yoF followed for allergic rhinitis, rhinosinusitis, asthmatic bronchitis, complicated by , GERD/ Crohn's, rheumatoid arthritis  Husband here.  Allergy vaccine1:10 GO  FOLLOWS FOR: Sinus pressure w/ PND and cough w/ mucus green in color x2 months. Also, having abdominal pain and frequent urination  Had flu vaccine. Describes 2 months increased sinus drainage with some  green mucus, frontal headaches. No fever, ear pressure or wheezing.  Still recovering from abrasion to legs when she fell last winter. Has been going to wound Center.  06/11/13- 67 yoF followed for allergic rhinitis, rhinosinusitis, asthmatic bronchitis, complicated by , GERD/ Crohn's, rheumatoid arthritis   c/o sinus congestion x 1 wk. feels like fluid in head and ears,no ear pain,coughing at night-yellow and green,feels like fever today,nasal  congestion -clear This began with an upper respiratory infection but has become focused in her head She continues allergy vaccine 1: 10 GO  07/31/2013 Acute OV  Complains of prod cough with green/yellow mucus, increased SOB, wheezing, fever, body aches.  began Augmentin and steroids on 1/13, fever began last night.  symptoms worse last night.  Patient says she was seen by her family doctor on January 12 and recommend to start on a prednisone taper. She was also called in Augmentin and Phenergan With Codeine cough syrup, which she began yesterday. She complains of a productive cough with thick, yellow green mucus. She's had fever that began last night. MAXIMUM TEMPERATURE 101.5. She also has body aches. She did have the flu shot. Patient's husband has had similar symptoms, but milder She denies any nausea, vomiting, diarrhea, abdominal pain, rash, recent travel Patient is eating, but has a decreased appetite. She says that Phenergan, codeine cough syrup is not helping. She is coughing nonstop. She denies any frank, hemoptysis, calf pain, orthopnea, or edema. Did notice a speck of blood. This morning, when she coughed very hard.   ROS-see HPI Constitutional:   No  weight loss, night sweats,  ++ Fevers, chills, fatigue, or  lassitude.  HEENT:   No headaches,  Difficulty swallowing,  Tooth/dental problems, or  Sore throat,                No sneezing, itching, ear ache,  +nasal congestion, post nasal drip,   CV:  No chest pain,  Orthopnea, PND, swelling  in lower extremities, anasarca, dizziness, palpitations, syncope.   GI  No heartburn, indigestion, abdominal pain, nausea, vomiting, diarrhea, change in bowel habits, loss of appetite, bloody stools.   Resp:    No chest wall deformity  Skin: no rash or lesions.  GU: no dysuria, change in color of urine, no urgency or frequency.  No flank pain, no hematuria   MS:  No joint pain or swelling.  No decreased range of motion.  No back pain.  Psych:  No change in mood or affect. No depression or anxiety.  No memory loss.      Objective:  OBJ- Physical Exam GEN: A/Ox3; pleasant , NAD, elderly   HEENT:  [place]/AT,  EACs-clear, TMs-wnl, NOSE-clear, THROAT-clear, no lesions, no postnasal drip or exudate noted.   NECK:  Supple w/ fair ROM; no JVD; normal carotid impulses w/o bruits; no thyromegaly or nodules palpated; no lymphadenopathy.  RESP  Coarse rhonchi , no wheezing , speaking in full sentences , no accessory muscle use, no dullness to percussion  CARD:  RRR, no m/r/g  , no peripheral edema, pulses intact, no cyanosis or clubbing.  GI:   Soft & nt; nml bowel sounds; no organomegaly or masses detected.  Musco: Warm bil, no deformities or joint swelling noted.   Neuro:  alert, no focal deficits noted.    Skin: Warm, no lesions or rashes

## 2013-08-01 ENCOUNTER — Encounter (HOSPITAL_COMMUNITY): Payer: Self-pay | Admitting: *Deleted

## 2013-08-01 DIAGNOSIS — J40 Bronchitis, not specified as acute or chronic: Secondary | ICD-10-CM

## 2013-08-01 LAB — COMPREHENSIVE METABOLIC PANEL
ALT: 16 U/L (ref 0–35)
AST: 29 U/L (ref 0–37)
Albumin: 2.4 g/dL — ABNORMAL LOW (ref 3.5–5.2)
Alkaline Phosphatase: 71 U/L (ref 39–117)
BUN: 19 mg/dL (ref 6–23)
CO2: 20 mEq/L (ref 19–32)
Calcium: 8.6 mg/dL (ref 8.4–10.5)
Chloride: 108 mEq/L (ref 96–112)
Creatinine, Ser: 0.72 mg/dL (ref 0.50–1.10)
GFR calc Af Amer: >90 mL/min (ref >90)
GFR calc non Af Amer: 86 mL/min — ABNORMAL LOW (ref >90)
Glucose, Bld: 115 mg/dL — ABNORMAL HIGH (ref 70–99)
Potassium: 4.3 mEq/L (ref 3.7–5.3)
Sodium: 139 mEq/L (ref 137–147)
Total Bilirubin: <0.2 mg/dL — ABNORMAL LOW (ref 0.3–1.2)
Total Protein: 5.5 g/dL — ABNORMAL LOW (ref 6.0–8.3)

## 2013-08-01 LAB — RESPIRATORY VIRUS PANEL
Adenovirus: NOT DETECTED
Influenza A H1: NOT DETECTED
Influenza A H3: DETECTED — AB
Influenza A: DETECTED — AB
Influenza B: NOT DETECTED
Metapneumovirus: NOT DETECTED
Parainfluenza 1: NOT DETECTED
Parainfluenza 2: NOT DETECTED
Parainfluenza 3: NOT DETECTED
Respiratory Syncytial Virus A: NOT DETECTED
Respiratory Syncytial Virus B: NOT DETECTED
Rhinovirus: NOT DETECTED

## 2013-08-01 LAB — EXPECTORATED SPUTUM ASSESSMENT W GRAM STAIN, RFLX TO RESP C: Special Requests: NORMAL

## 2013-08-01 LAB — CBC WITH DIFFERENTIAL/PLATELET
Basophils Absolute: 0 10*3/uL (ref 0.0–0.1)
Basophils Relative: 0 % (ref 0–1)
Eosinophils Absolute: 0 10*3/uL (ref 0.0–0.7)
Eosinophils Relative: 0 % (ref 0–5)
HCT: 33.6 % — ABNORMAL LOW (ref 36.0–46.0)
Hemoglobin: 10.7 g/dL — ABNORMAL LOW (ref 12.0–15.0)
Lymphocytes Relative: 14 % (ref 12–46)
Lymphs Abs: 0.9 10*3/uL (ref 0.7–4.0)
MCH: 29.2 pg (ref 26.0–34.0)
MCHC: 31.8 g/dL (ref 30.0–36.0)
MCV: 91.8 fL (ref 78.0–100.0)
Monocytes Absolute: 0.5 10*3/uL (ref 0.1–1.0)
Monocytes Relative: 8 % (ref 3–12)
Neutro Abs: 5 10*3/uL (ref 1.7–7.7)
Neutrophils Relative %: 79 % — ABNORMAL HIGH (ref 43–77)
Platelets: 229 10*3/uL (ref 150–400)
RBC: 3.66 MIL/uL — ABNORMAL LOW (ref 3.87–5.11)
RDW: 16.3 % — ABNORMAL HIGH (ref 11.5–15.5)
WBC: 6.3 10*3/uL (ref 4.0–10.5)

## 2013-08-01 LAB — LEGIONELLA ANTIGEN, URINE: Legionella Antigen, Urine: NEGATIVE

## 2013-08-01 LAB — URINE CULTURE
Colony Count: NO GROWTH
Culture: NO GROWTH

## 2013-08-01 LAB — GLUCOSE, CAPILLARY
Glucose-Capillary: 81 mg/dL (ref 70–99)
Glucose-Capillary: 87 mg/dL (ref 70–99)

## 2013-08-01 LAB — STREP PNEUMONIAE URINARY ANTIGEN: Strep Pneumo Urinary Antigen: NEGATIVE

## 2013-08-01 LAB — MRSA PCR SCREENING: MRSA by PCR: NEGATIVE

## 2013-08-01 LAB — EXPECTORATED SPUTUM ASSESSMENT W REFEX TO RESP CULTURE

## 2013-08-01 LAB — PREALBUMIN: Prealbumin: 11.2 mg/dL — ABNORMAL LOW (ref 17.0–34.0)

## 2013-08-01 MED ORDER — ALBUTEROL SULFATE (2.5 MG/3ML) 0.083% IN NEBU
2.5000 mg | INHALATION_SOLUTION | Freq: Four times a day (QID) | RESPIRATORY_TRACT | Status: DC
  Administered 2013-08-01 – 2013-08-04 (×13): 2.5 mg via RESPIRATORY_TRACT
  Filled 2013-08-01 (×14): qty 3

## 2013-08-01 MED ORDER — GUAIFENESIN ER 600 MG PO TB12
600.0000 mg | ORAL_TABLET | Freq: Two times a day (BID) | ORAL | Status: DC
  Administered 2013-08-01 – 2013-08-03 (×5): 600 mg via ORAL
  Filled 2013-08-01 (×7): qty 1

## 2013-08-01 MED ORDER — POTASSIUM CHLORIDE CRYS ER 20 MEQ PO TBCR
20.0000 meq | EXTENDED_RELEASE_TABLET | Freq: Every day | ORAL | Status: DC
  Administered 2013-08-02: 20 meq via ORAL
  Filled 2013-08-01 (×2): qty 1

## 2013-08-01 MED ORDER — FAMOTIDINE 20 MG PO TABS
20.0000 mg | ORAL_TABLET | Freq: Two times a day (BID) | ORAL | Status: DC | PRN
  Administered 2013-08-01: 20 mg via ORAL
  Filled 2013-08-01: qty 1

## 2013-08-01 MED ORDER — LEVOFLOXACIN 750 MG PO TABS
750.0000 mg | ORAL_TABLET | Freq: Every day | ORAL | Status: AC
  Administered 2013-08-01 – 2013-08-05 (×5): 750 mg via ORAL
  Filled 2013-08-01 (×5): qty 1

## 2013-08-01 MED ORDER — POTASSIUM CHLORIDE CRYS ER 20 MEQ PO TBCR: 20.0000 meq | EXTENDED_RELEASE_TABLET | Freq: Every day | ORAL | Status: DC

## 2013-08-01 NOTE — Progress Notes (Signed)
[facility] PROGRESS NOTE  FLORENE BRILL GBE:[id number] DOB: 11-06-1945 DOA: 07/31/2013  PCP: Leonard Downing, MD  Brief HPI: JENILYN MAGANA is a 68 y.o. female with a past medical history of Esophageal reflux; Acute asthmatic bronchitis; Rheumatoid arthritis(714.0); and Allergic rhinitis. She presented with 3 days hx of bad cough, fever up to 101.5, fatigue, she has been so weak she has trouble ambulating normally walks with cane. She has hx. of asthma followed by Dr. Annamaria Boots. 2 days ago she went to PCP who gave her prednisone 20 mg and cough suppressant. Then she went to see Dr. Annamaria Boots and was given prescription for Augmentin, Tamiflu. She was found to be febrile and had a CXR done that showed bilateral PNA at which point she was sent to ER. In ER patient was noted to be hypoxic down to 79 required up to 4 L of oxygen to maintain O2 saturation patient normally not on oxygen at home. Hospitalist was called for admission  Past medical history:  Past Medical History  Diagnosis Date  . Esophageal reflux   . Acute asthmatic bronchitis   . Rheumatoid arthritis(714.0)   . Allergic rhinitis     Consultants: Pulm  Procedures: None  Antibiotics: Vanc 1/14--> Zosyn 1/14--> Tamiflu 1/14-->  Subjective: Patient feels better compared to yesterday. Denies any chest pain. Breathing is better. Still with cough with grey expectoration.  Objective: Vital Signs  Filed Vitals:   08/01/13 0100 08/01/13 0200 08/01/13 0300 08/01/13 0500  BP: 114/90 103/58 100/53 95/52  Pulse: 80 71 72 70  Temp:   98.1 F (36.7 C)   TempSrc:   Oral   Resp: _0 Height:      Weight:      SpO2: 91% 96% 96% 98%    Intake/Output Summary (Last 24 hours) at 08/01/13 1018 Last data filed at 08/01/13 0700  Gross per 24 hour  Intake 993.75 ml  Output   1150 ml  Net -156.25 ml   Filed Weights   07/31/13 2300 07/31/13 2325  Weight: 78 kg (171 lb 15.3 oz) 78.2 kg (172 lb 6.4 oz)    General  appearance: alert, cooperative, appears stated age, no distress and morbidly obese Resp: Wheezing bilaterally. Crackles at bases. Cardio: regular rate and rhythm, S1, S2 normal, no murmur, click, rub or gallop GI: soft, non-tender; bowel sounds normal; no masses,  no organomegaly Extremities: extremities normal, atraumatic, no cyanosis or edema Pulses: 2+ and symmetric Neurologic: Grossly normal  Lab Results:  Basic Metabolic Panel:  Recent Labs Lab 07/31/13 1609 08/01/13 0319  NA 134* 139  K 4.3 4.3  CL 103 108  CO2 17* 20  GLUCOSE 110* 115*  BUN 25* 19  CREATININE 0.85 0.72  CALCIUM 9.6 8.6   Liver Function Tests:  Recent Labs Lab 07/31/13 1609 08/01/13 0319  AST 38* 29  ALT 20 16  ALKPHOS 84 71  BILITOT 0.2* <0.2*  PROT 6.6 5.5*  ALBUMIN 2.9* 2.4*   No results found for this basename: LIPASE, AMYLASE,  in the last 168 hours No results found for this basename: AMMONIA,  in the last 168 hours CBC:  Recent Labs Lab 07/31/13 1609 08/01/13 0319  WBC 10.8* 6.3  NEUTROABS 9.3* 5.0  HGB 12.8 10.7*  HCT 38.9 33.6*  MCV 90.3 91.8  PLT 275 229   Cardiac Enzymes: No results found for this basename: CKTOTAL, CKMB, CKMBINDEX, TROPONINI,  in the last 168 hours BNP (last 3 results)  Recent Labs  07/31/13 1706  PROBNP 1242.0*   CBG:  Recent Labs Lab 08/01/13 0743  GLUCAP 81    Recent Results (from the past 240 hour(s))  CULTURE, BLOOD (ROUTINE X 2)     Status: None   Collection Time    07/31/13  4:20 PM      Result Value Range Status   Specimen Description BLOOD RIGHT ARM   Final   Special Requests BOTTLES DRAWN AEROBIC ONLY 6CC   Final   Culture  Setup Time     Final   Value: 07/31/2013 20:45     Performed at [facility]   Culture     Final   Value:        BLOOD CULTURE RECEIVED NO GROWTH TO DATE CULTURE WILL BE HELD FOR 5 DAYS BEFORE ISSUING A FINAL NEGATIVE REPORT     Performed at [facility]   Report Status PENDING    Incomplete  CULTURE, BLOOD (ROUTINE X 2)     Status: None   Collection Time    07/31/13  4:25 PM      Result Value Range Status   Specimen Description BLOOD RIGHT HAND   Final   Special Requests BOTTLES DRAWN AEROBIC ONLY 5CC   Final   Culture  Setup Time     Final   Value: 07/31/2013 20:48     Performed at [facility]   Culture     Final   Value:        BLOOD CULTURE RECEIVED NO GROWTH TO DATE CULTURE WILL BE HELD FOR 5 DAYS BEFORE ISSUING A FINAL NEGATIVE REPORT     Performed at [facility]   Report Status PENDING   Incomplete  MRSA PCR SCREENING     Status: None   Collection Time    07/31/13 11:29 PM      Result Value Range Status   MRSA by PCR NEGATIVE  NEGATIVE Final   Comment:            The GeneXpert MRSA Assay (FDA     approved for NASAL specimens     only), is one component of a     comprehensive MRSA colonization     surveillance program. It is not     intended to diagnose MRSA     infection nor to guide or     monitor treatment for     MRSA infections.  CULTURE, EXPECTORATED SPUTUM-ASSESSMENT     Status: None   Collection Time    08/01/13  1:34 AM      Result Value Range Status   Specimen Description SPUTUM   Final   Special Requests Normal   Final   Sputum evaluation     Final   Value: THIS SPECIMEN IS ACCEPTABLE. RESPIRATORY CULTURE REPORT TO FOLLOW.   Report Status 08/01/2013 FINAL   Final      Studies/Results: Dg Chest 2 View  07/31/2013   CLINICAL DATA:  68 year old female with cough shortness of Breath chest pain and fever. Initial encounter.  EXAM: CHEST  2 VIEW  COMPARISON:  09/10/2012 and earlier.  FINDINGS: Bilateral perihilar and confluent pulmonary opacity, maximal in the left lower lobe. Stable lung volumes. Stable cardiac size and mediastinal contours. Visualized tracheal air column is within normal limits. No pneumothorax. No pleural effusion identified. No acute osseous abnormality identified. Partially visible lumbar fusion  hardware.  IMPRESSION: Confluent pulmonary opacity, nonspecific but most compatible with bilateral pneumonia in this setting. Post treatment  radiographs recommended to document resolution.   Electronically Signed   By: Lars Pinks M.D.   On: 07/31/2013 13:01    Medications:  Scheduled: . albuterol  2.5 mg Nebulization Q6H  . aspirin EC  81 mg Oral Daily  . colestipol  1.5 g Oral Q breakfast   And  . colestipol  0.5 g Oral QAC supper  . enoxaparin (LOVENOX) injection  40 mg Subcutaneous Daily  . FLUoxetine  20 mg Oral Daily  . guaiFENesin  600 mg Oral BID  . mirtazapine  15 mg Oral QHS  . montelukast  10 mg Oral QHS  . oseltamivir  75 mg Oral BID  . pantoprazole  40 mg Oral Daily  . piperacillin-tazobactam (ZOSYN)  IV  3.375 g Intravenous Q8H  . simvastatin  20 mg Oral q1800  . vancomycin  750 mg Intravenous Q12H   Continuous: . sodium chloride 75 mL/hr (08/01/13 0005)   OQH:UTMLYYTKP, ALPRAZolam, benzonatate, chlorpheniramine-HYDROcodone, traMADol  Assessment/Plan:  Active Problems:   ASTHMATIC BRONCHITIS, ACUTE   CAP (community acquired pneumonia)   Hypoxia    CAP (community acquired pneumonia) with Hypoxia Continue current antibiotics. Pulm following. Continue O2. Since she is wheezing will add scheduled Nebs. Defer steroids to Pulm. Patient appears to be stable currently and can be transferred to floor. Continue Tamiflu. Influenza PCR is pending. Consider adding coverage for atypicals.  History of ASTHMATIC BRONCHITIS As above. Add nebs. Steroids deferred to Pulm.   SIRS Stable. Lactic acid and PCT is normal. BP normal. Cultures pending. No indication for stress steroids at this time.  History of RA Stable. Has been on steroid courses in past. Not on chronic steroids.  Code Status: Full Code  DVT Prophylaxis: Lovenox    Family Communication: Discussed with patient  Disposition Plan: ok for transfer to floor.    LOS: 1 day   [facility] Pager [phone] 08/01/2013, 10:18 AM  If 8PM-8AM, please contact night-coverage at [email or web address], password [facility]

## 2013-08-01 NOTE — Progress Notes (Signed)
Report called to [facility] on unit 5W. Patient to be transferred via bed to Rm 5W13

## 2013-08-01 NOTE — Progress Notes (Signed)
Utilization review completed. Mischa Brittingham, RN, BSN. 

## 2013-08-01 NOTE — Progress Notes (Signed)
Pt transfer from [facility] via [facility]. VSS no c/o of pain. Will contin to monitor.

## 2013-08-02 ENCOUNTER — Inpatient Hospital Stay (HOSPITAL_COMMUNITY): Payer: Medicare Other

## 2013-08-02 DIAGNOSIS — J1100 Influenza due to unidentified influenza virus with unspecified type of pneumonia: Secondary | ICD-10-CM

## 2013-08-02 DIAGNOSIS — J09X1 Influenza due to identified novel influenza A virus with pneumonia: Secondary | ICD-10-CM

## 2013-08-02 LAB — PROCALCITONIN: Procalcitonin: 0.13 ng/mL

## 2013-08-02 MED ORDER — GLYCOPYRROLATE 1 MG PO TABS
1.0000 mg | ORAL_TABLET | Freq: Three times a day (TID) | ORAL | Status: DC | PRN
  Administered 2013-08-02 – 2013-08-03 (×3): 1 mg via ORAL
  Filled 2013-08-02 (×8): qty 1

## 2013-08-02 MED ORDER — HYDROCODONE-ACETAMINOPHEN 10-325 MG PO TABS
1.0000 | ORAL_TABLET | Freq: Four times a day (QID) | ORAL | Status: DC | PRN
  Administered 2013-08-03 – 2013-08-05 (×4): 1 via ORAL
  Filled 2013-08-02 (×4): qty 1

## 2013-08-02 MED ORDER — ZOLPIDEM TARTRATE 5 MG PO TABS
5.0000 mg | ORAL_TABLET | Freq: Every day | ORAL | Status: AC
  Administered 2013-08-02: 5 mg via ORAL
  Filled 2013-08-02: qty 1

## 2013-08-02 NOTE — ED Provider Notes (Signed)
I saw and evaluated the patient, reviewed the resident's note and I agree with the findings and plan.  Patient is a 68 year old female with history of bronchitis.  She presents with a 3 week history of shortness of breath with no fever, chest pain, chills, or vomiting.    On exam, the patient is afebrile and vitals are stable with the exception of hypoxia on room air.  Head is at, [place].  Heart is rrr.  Lungs have rales in the bases bilaterally.  Abdomen is benign.  Extremities without edema.    Workup reveals bilateral infiltrates with hypoxia, elevated wbc and bnp.  Will treat with antibiotics and admit due to hypoxia.    EKG Interpretation    Date/Time:  Wednesday July 31 2013 18:25:32 EST Ventricular Rate:  85 PR Interval:  152 QRS Duration: 87 QT Interval:  363 QTC Calculation: 432 R Axis:   8 Text Interpretation:  Sinus rhythm Low voltage, precordial leads Confirmed by Beau Fanny  MD, Mickael Mcnutt ([id number]) on 07/31/2013 9:12:25 PM              Veryl Speak, MD 08/02/13 1457

## 2013-08-02 NOTE — Progress Notes (Signed)
Name: [name] MRN: [id number] DOB: 1946-04-18    ADMISSION DATE:  07/31/2013 CONSULTATION DATE:  07/31/13  REFERRING MD :  Dr. Roel Cluck / [facility] PRIMARY SERVICE:  [facility]  CHIEF COMPLAINT:  Cough, fever, fatigue  BRIEF PATIENT DESCRIPTION: [name] admitted on 1/14 w concern for CAP, acute asthma exacerbation.  PCCM consulted for pulmonary evaluation.   SIGNIFICANT EVENTS / STUDIES:  1/14 - Admit with acute asthma exacerbation, concern for CAP  CULTURES: BCx2 1/14>>> UC 1/14>>>neg Sputum 1/14>>> Resp viral 1/14>>>++flu a UA 1/14>>>3-6 wbc, few bacteria, neg nitrate Urine strep - NEG Urine leg -   ANTIBIOTICS: Vanco 1/14>>> Zosyn 1/14>>> Tamiflu 1/15>> Levaquin 1/15>>  HISTORY OF PRESENT ILLNESS:  [name] with a PMH of allergies, asthma, GERD and RA (on intermittent prednisone) who presented to [facility] ER on 1/14 with a two week history of feeling poorly.  She reports her husband has been ill.  They visited their PCP ion 1/12 and she was placed on pulse prednisone, augmentin and cough syrup.  She continued to have cough with creamy yellow sputum production, post-nasal gtt, SOB, fevers, chills and body aches.  She did not have any improvemnt and visited the pulmonary office on 1/14.  CXR evaluated with concern for bilateral airspace disease.  She was treated with tamiflu & tussionex and instructed to finish current antibiotic regimen / prednisone.  Unfortunately, patient continued to feel worse, returning to ER on the evening of 1/14.  On ER arrival, she was noted to have saturations of 79% on RA. Pt was placed on 4L of oxygen with improvement of saturations.     SUBJECTIVE: On O2 , feel congested  VITAL SIGNS: Temp:  [97.8 F (36.6 C)-98.6 F (37 C)] 98.5 F (36.9 C) (01/16 0536) Pulse Rate:  [75-91] 80 (01/16 0536) Resp:  [18-24] 18 (01/16 0536) BP: (101-116)/(58-72) 110/69 mmHg (01/16 0536) SpO2:  [93 %-97 %] 94 % (01/16 0536) Weight:  [180 lb 12.4 oz (82 kg)] 180 lb 12.4  oz (82 kg) (01/15 1532)  PHYSICAL EXAMINATION: General:  wdwn adult female Neuro:  AAOx4, speech clear, MAE HEENT:  Mm pink/moist, no jvd Cardiovascular:  s1s2 rrr, no m/r/g Lungs:  resp's even/non-labored, lungs bilaterally with exp wheeze, coarse breath sounds, no crackles / rhonchi Abdomen:  Round/soft, bsx4 active Musculoskeletal:  No acute deformity, pronounced joints in hands Skin:  Warm/dry, no edema   PULMONARY No results found for this basename: PHART, PCO2, PCO2ART, PO2, PO2ART, HCO3, TCO2, O2SAT,  in the last 168 hours  CBC  Recent Labs Lab 07/31/13 1609 08/01/13 0319  HGB 12.8 10.7*  HCT 38.9 33.6*  WBC 10.8* 6.3  PLT 275 229    COAGULATION No results found for this basename: INR,  in the last 168 hours  CARDIAC  No results found for this basename: TROPONINI,  in the last 168 hours  Recent Labs Lab 07/31/13 1706  PROBNP 1242.0*     CHEMISTRY  Recent Labs Lab 07/31/13 1609 08/01/13 0319  NA 134* 139  K 4.3 4.3  CL 103 108  CO2 17* 20  GLUCOSE 110* 115*  BUN 25* 19  CREATININE 0.85 0.72  CALCIUM 9.6 8.6   Estimated Creatinine Clearance: 65.9 ml/min (by C-G formula based on Cr of 0.72).   LIVER  Recent Labs Lab 07/31/13 1609 08/01/13 0319  AST 38* 29  ALT 20 16  ALKPHOS 84 71  BILITOT 0.2* <0.2*  PROT 6.6 5.5*  ALBUMIN 2.9* 2.4*  INFECTIOUS  Recent Labs Lab 07/31/13 1719 07/31/13 2249 07/31/13 2254 08/02/13 0548  LATICACIDVEN 0.85 1.2  --   --   PROCALCITON  --   --  0.32 0.13     ENDOCRINE CBG (last 3)   Recent Labs  08/01/13 0743 08/01/13 1219  GLUCAP 81 26         IMAGING x48h  Dg Chest 2 View  08/02/2013   CLINICAL DATA:  Pneumonia, airspace disease, followup  EXAM: CHEST  2 VIEW  COMPARISON:  07/31/2013  FINDINGS: Enlargement of cardiac silhouette.  Slight pulmonary vascular congestion.  Persistent focal opacity in the right upper lobe likely representing pneumonia.  Additional infiltrates are  seen at the bases bilaterally.  Minimal atherosclerotic calcification aortic arch.  No gross pleural effusion or pneumothorax.  Bones demineralized.  IMPRESSION: Patchy bilateral infiltrates likely representing pneumonia.  Due to the focal nature of the right upper lobe opacity, followup exams until resolution recommended to exclude underlying mass/nodule.   Electronically Signed   By: Lavonia Dana M.D.   On: 08/02/2013 09:51   Dg Chest 2 View  07/31/2013   CLINICAL DATA:  68 year old female with cough shortness of Breath chest pain and fever. Initial encounter.  EXAM: CHEST  2 VIEW  COMPARISON:  09/10/2012 and earlier.  FINDINGS: Bilateral perihilar and confluent pulmonary opacity, maximal in the left lower lobe. Stable lung volumes. Stable cardiac size and mediastinal contours. Visualized tracheal air column is within normal limits. No pneumothorax. No pleural effusion identified. No acute osseous abnormality identified. Partially visible lumbar fusion hardware.  IMPRESSION: Confluent pulmonary opacity, nonspecific but most compatible with bilateral pneumonia in this setting. Post treatment radiographs recommended to document resolution.   Electronically Signed   By: Lars Pinks M.D.   On: 07/31/2013 13:01      ASSESSMENT / PLAN:  CAP - concern for R>L airspace disease  Acute Asthma Exacerbation  A+  Influenza  Plan: -empiric tamiflu -continue current abx with immune compromised status (intermittant pred) -follow blood, sputum, urine studies -PCT protocol, narrow abx as able -pulmonary hygiene -droplet precautions -oxygen to support sats > 93% -gentle hydration  STEVE MINOR NP   STAFF MD Other issues addressed  - Flu: advised husband for 10d prohylaxis - Chrioic back pain: restart home hydrocodone at her request - Insomnia: ambient tonight x1  - rest : per NP   Dr. Brand Males, M.D., [facility].C.P Pulmonary and Critical Care Medicine Staff Physician [facility]  Pulmonary and Critical Care Pager: [phone], If no answer or between  15:00h - 7:00h: call [phone]  08/02/2013 10:53 AM

## 2013-08-02 NOTE — Progress Notes (Signed)
[facility] PROGRESS NOTE  [name] [name] DOB: [birth year] DOA: 07/31/2013  PCP: Leonard Downing, MD  Brief HPI: [name] is a 68 y.o. female with a past medical history of Esophageal reflux; Acute asthmatic bronchitis; Rheumatoid arthritis(714.0); and Allergic rhinitis. She presented with 3 days hx of bad cough, fever up to 101.5, fatigue, she has been so weak she has trouble ambulating normally walks with cane. She has hx. of asthma followed by Dr. Annamaria Boots. 2 days ago she went to PCP who gave her prednisone 20 mg and cough suppressant. Then she went to see Dr. Annamaria Boots and was given prescription for Augmentin, Tamiflu. She was found to be febrile and had a CXR done that showed bilateral PNA at which point she was sent to ER. In ER patient was noted to be hypoxic down to 79 required up to 4 L of oxygen to maintain O2 saturation patient normally not on oxygen at home. Hospitalist was called for admission  Past medical history:  Past Medical History  Diagnosis Date  . Esophageal reflux   . Acute asthmatic bronchitis   . Rheumatoid arthritis(714.0)   . Allergic rhinitis     Consultants: Pulm  Procedures: None  Antibiotics: Vanc 1/14--> Zosyn 1/14--> Tamiflu 1/14--> Levaquin 1/15-->  Subjective: Patient feels better compared to yesterday. Denies any chest pain. Breathing is better. Complains of chronic back pain.  Objective: Vital Signs  Filed Vitals:   08/01/13 2051 08/02/13 0130 08/02/13 0203 08/02/13 0536  BP: 111/72  116/69 110/69  Pulse: 81  85 80  Temp: 98.4 F (36.9 C)  98.6 F (37 C) 98.5 F (36.9 C)  TempSrc: Oral  Oral Oral  Resp: _0 Height:      Weight:      SpO2: 94%  96% 94%   No intake or output data in the 24 hours ending 08/02/13 1135 Filed Weights   07/31/13 2300 07/31/13 2325 08/01/13 1532  Weight: 78 kg (171 lb 15.3 oz) 78.2 kg (172 lb 6.4 oz) 82 kg (180 lb 12.4 oz)    General appearance: alert, cooperative,  appears stated age, no distress and morbidly obese Resp: Wheezing bilaterally. Crackles at bases. Improved air entry. Cardio: regular rate and rhythm, S1, S2 normal, no murmur, click, rub or gallop GI: soft, non-tender; bowel sounds normal; no masses,  no organomegaly Extremities: extremities normal, atraumatic, no cyanosis or edema Pulses: 2+ and symmetric Neurologic: No LE weakness.  Lab Results:  Basic Metabolic Panel:  Recent Labs Lab 07/31/13 1609 08/01/13 0319  NA 134* 139  K 4.3 4.3  CL 103 108  CO2 17* 20  GLUCOSE 110* 115*  BUN 25* 19  CREATININE 0.85 0.72  CALCIUM 9.6 8.6   Liver Function Tests:  Recent Labs Lab 07/31/13 1609 08/01/13 0319  AST 38* 29  ALT 20 16  ALKPHOS 84 71  BILITOT 0.2* <0.2*  PROT 6.6 5.5*  ALBUMIN 2.9* 2.4*   CBC:  Recent Labs Lab 07/31/13 1609 08/01/13 0319  WBC 10.8* 6.3  NEUTROABS 9.3* 5.0  HGB 12.8 10.7*  HCT 38.9 33.6*  MCV 90.3 91.8  PLT 275 229   BNP (last 3 results)  Recent Labs  07/31/13 1706  PROBNP 1242.0*   CBG:  Recent Labs Lab 08/01/13 0743 08/01/13 1219  GLUCAP 81 87    Recent Results (from the past 240 hour(s))  CULTURE, BLOOD (ROUTINE X 2)     Status: None   Collection Time  07/31/13  4:20 PM      Result Value Range Status   Specimen Description BLOOD RIGHT ARM   Final   Special Requests BOTTLES DRAWN AEROBIC ONLY 6CC   Final   Culture  Setup Time     Final   Value: 07/31/2013 20:45     Performed at [facility]   Culture     Final   Value:        BLOOD CULTURE RECEIVED NO GROWTH TO DATE CULTURE WILL BE HELD FOR 5 DAYS BEFORE ISSUING A FINAL NEGATIVE REPORT     Performed at [facility]   Report Status PENDING   Incomplete  CULTURE, BLOOD (ROUTINE X 2)     Status: None   Collection Time    07/31/13  4:25 PM      Result Value Range Status   Specimen Description BLOOD RIGHT HAND   Final   Special Requests BOTTLES DRAWN AEROBIC ONLY 5CC   Final   Culture  Setup Time      Final   Value: 07/31/2013 20:48     Performed at [facility]   Culture     Final   Value:        BLOOD CULTURE RECEIVED NO GROWTH TO DATE CULTURE WILL BE HELD FOR 5 DAYS BEFORE ISSUING A FINAL NEGATIVE REPORT     Performed at [facility]   Report Status PENDING   Incomplete  URINE CULTURE     Status: None   Collection Time    07/31/13  5:33 PM      Result Value Range Status   Specimen Description URINE, CLEAN CATCH   Final   Special Requests NONE   Final   Culture  Setup Time     Final   Value: 07/31/2013 19:21     Performed at [place]     Final   Value: NO GROWTH     Performed at [facility]   Culture     Final   Value: NO GROWTH     Performed at [facility]   Report Status 08/01/2013 FINAL   Final  MRSA PCR SCREENING     Status: None   Collection Time    07/31/13 11:29 PM      Result Value Range Status   MRSA by PCR NEGATIVE  NEGATIVE Final   Comment:            The GeneXpert MRSA Assay (FDA     approved for NASAL specimens     only), is one component of a     comprehensive MRSA colonization     surveillance program. It is not     intended to diagnose MRSA     infection nor to guide or     monitor treatment for     MRSA infections.  CULTURE, EXPECTORATED SPUTUM-ASSESSMENT     Status: None   Collection Time    08/01/13  1:34 AM      Result Value Range Status   Specimen Description SPUTUM   Final   Special Requests Normal   Final   Sputum evaluation     Final   Value: THIS SPECIMEN IS ACCEPTABLE. RESPIRATORY CULTURE REPORT TO FOLLOW.   Report Status 08/01/2013 FINAL   Final  CULTURE, RESPIRATORY (NON-EXPECTORATED)     Status: None   Collection Time    08/01/13  1:34 AM      Result  Value Range Status   Specimen Description SPUTUM   Final   Special Requests NONE   Final   Gram Stain     Final   Value: FEW WBC PRESENT,BOTH PMN AND MONONUCLEAR     RARE SQUAMOUS EPITHELIAL CELLS PRESENT     RARE GRAM  NEGATIVE RODS     Performed at [facility]   Culture     Final   Value: FEW CANDIDA ALBICANS     Performed at [facility]   Report Status PENDING   Incomplete  RESPIRATORY VIRUS PANEL     Status: Abnormal   Collection Time    08/01/13  2:00 AM      Result Value Range Status   Source - RVPAN NASOPHARYNGEAL   Final   Respiratory Syncytial Virus A NOT DETECTED   Final   Respiratory Syncytial Virus B NOT DETECTED   Final   Influenza A DETECTED (*)  Final   Influenza B NOT DETECTED   Final   Parainfluenza 1 NOT DETECTED   Final   Parainfluenza 2 NOT DETECTED   Final   Parainfluenza 3 NOT DETECTED   Final   Metapneumovirus NOT DETECTED   Final   Rhinovirus NOT DETECTED   Final   Adenovirus NOT DETECTED   Final   Influenza A H1 NOT DETECTED   Final   Influenza A H3 DETECTED (*)  Final   Comment: (NOTE)           Normal Reference Range for each Analyte: NOT DETECTED     Testing performed using the Luminex xTAG Respiratory Viral Panel test     kit.     This test was developed and its performance characteristics determined     by [facility]. It has not been cleared or approved by the [place]     Food and Drug Administration. This test is used for clinical purposes.     It should not be regarded as investigational or for research. This     laboratory is certified under the [place] (CLIA) as qualified to perform high complexity     clinical laboratory testing.     Performed at [facility]      Studies/Results: Dg Chest 2 View  08/02/2013   CLINICAL DATA:  Pneumonia, airspace disease, followup  EXAM: CHEST  2 VIEW  COMPARISON:  07/31/2013  FINDINGS: Enlargement of cardiac silhouette.  Slight pulmonary vascular congestion.  Persistent focal opacity in the right upper lobe likely representing pneumonia.  Additional infiltrates are seen at the bases bilaterally.  Minimal atherosclerotic calcification aortic arch.   No gross pleural effusion or pneumothorax.  Bones demineralized.  IMPRESSION: Patchy bilateral infiltrates likely representing pneumonia.  Due to the focal nature of the right upper lobe opacity, followup exams until resolution recommended to exclude underlying mass/nodule.   Electronically Signed   By: Lavonia Dana M.D.   On: 08/02/2013 09:51   Dg Chest 2 View  07/31/2013   CLINICAL DATA:  68 year old female with cough shortness of Breath chest pain and fever. Initial encounter.  EXAM: CHEST  2 VIEW  COMPARISON:  09/10/2012 and earlier.  FINDINGS: Bilateral perihilar and confluent pulmonary opacity, maximal in the left lower lobe. Stable lung volumes. Stable cardiac size and mediastinal contours. Visualized tracheal air column is within normal limits. No pneumothorax. No pleural effusion identified. No acute osseous abnormality identified. Partially visible lumbar fusion hardware.  IMPRESSION: Confluent  pulmonary opacity, nonspecific but most compatible with bilateral pneumonia in this setting. Post treatment radiographs recommended to document resolution.   Electronically Signed   By: Lars Pinks M.D.   On: 07/31/2013 13:01    Medications:  Scheduled: . albuterol  2.5 mg Nebulization Q6H  . aspirin EC  81 mg Oral Daily  . colestipol  1.5 g Oral Q breakfast   And  . colestipol  0.5 g Oral QAC supper  . enoxaparin (LOVENOX) injection  40 mg Subcutaneous Daily  . FLUoxetine  20 mg Oral Daily  . guaiFENesin  600 mg Oral BID  . levofloxacin  750 mg Oral Daily  . mirtazapine  15 mg Oral QHS  . montelukast  10 mg Oral QHS  . oseltamivir  75 mg Oral BID  . pantoprazole  40 mg Oral Daily  . piperacillin-tazobactam (ZOSYN)  IV  3.375 g Intravenous Q8H  . potassium chloride SA  20 mEq Oral Daily  . simvastatin  20 mg Oral q1800  . vancomycin  750 mg Intravenous Q12H  . zolpidem  5 mg Oral QHS   Continuous: . sodium chloride 75 mL/hr (08/01/13 0005)   IWL:NLGXQJJHE, ALPRAZolam,  chlorpheniramine-HYDROcodone, famotidine, glycopyrrolate, HYDROcodone-acetaminophen, traMADol  Assessment/Plan:  Active Problems:   ASTHMATIC BRONCHITIS, ACUTE   CAP (community acquired pneumonia)   Hypoxia   Influenza A with pneumonia    Influenza and CAP (community acquired pneumonia) with Hypoxia Continue current antibiotics and Tamiflu. Pulm following. Continue O2. Wheezing is better. Defer steroids to Pulm. Patient appears to be improving slowly. Mobilize. Blood cultures negative so far. Sputum culture negative as well. If remains negative we can discontinue Vanc and Zosyn and leave her on Levaquin and tamiflu.  History of ASTHMATIC BRONCHITIS As above. Nebs. Steroids deferred to Pulm.   SIRS Stable. Lactic acid and PCT is normal. BP normal. Cultures pending. No indication for stress steroids at this time.  History of RA Stable. Has been on steroid courses in past. Not on chronic steroids.  Chronic Back Pain Tramadol started by Pulm. Mobilize. PT/OT.  Code Status: Full Code  DVT Prophylaxis: Lovenox    Family Communication: Discussed with patient and husband Disposition Plan: Mobilize. PT/OT. Anticipate discharge in 2-3 days    LOS: 2 days   [facility] Pager [phone] 08/02/2013, 11:35 AM  If 8PM-8AM, please contact night-coverage at [email or web address], password [facility]

## 2013-08-03 ENCOUNTER — Inpatient Hospital Stay (HOSPITAL_COMMUNITY): Payer: Medicare Other

## 2013-08-03 LAB — BASIC METABOLIC PANEL
BUN: 7 mg/dL (ref 6–23)
CO2: 21 mEq/L (ref 19–32)
Calcium: 9.1 mg/dL (ref 8.4–10.5)
Chloride: 114 mEq/L — ABNORMAL HIGH (ref 96–112)
Creatinine, Ser: 0.71 mg/dL (ref 0.50–1.10)
GFR calc Af Amer: >90 mL/min (ref >90)
GFR calc non Af Amer: 86 mL/min — ABNORMAL LOW (ref >90)
Glucose, Bld: 75 mg/dL (ref 70–99)
Potassium: 3.5 mEq/L — ABNORMAL LOW (ref 3.7–5.3)
Sodium: 147 mEq/L (ref 137–147)

## 2013-08-03 LAB — CBC
HCT: 32.9 % — ABNORMAL LOW (ref 36.0–46.0)
Hemoglobin: 10.8 g/dL — ABNORMAL LOW (ref 12.0–15.0)
MCH: 29.6 pg (ref 26.0–34.0)
MCHC: 32.8 g/dL (ref 30.0–36.0)
MCV: 90.1 fL (ref 78.0–100.0)
Platelets: 247 10*3/uL (ref 150–400)
RBC: 3.65 MIL/uL — ABNORMAL LOW (ref 3.87–5.11)
RDW: 16.2 % — ABNORMAL HIGH (ref 11.5–15.5)
WBC: 4.1 10*3/uL (ref 4.0–10.5)

## 2013-08-03 LAB — CULTURE, RESPIRATORY W GRAM STAIN

## 2013-08-03 LAB — LEGIONELLA ANTIGEN, URINE: Legionella Antigen, Urine: NEGATIVE

## 2013-08-03 LAB — CULTURE, RESPIRATORY

## 2013-08-03 MED ORDER — POTASSIUM CHLORIDE CRYS ER 20 MEQ PO TBCR
40.0000 meq | EXTENDED_RELEASE_TABLET | Freq: Once | ORAL | Status: AC
  Administered 2013-08-03: 40 meq via ORAL
  Filled 2013-08-03: qty 2

## 2013-08-03 MED ORDER — POTASSIUM CHLORIDE CRYS ER 20 MEQ PO TBCR: 20.0000 meq | EXTENDED_RELEASE_TABLET | Freq: Once | ORAL | Status: DC

## 2013-08-03 MED ORDER — NITROGLYCERIN 0.4 MG SL SUBL: 0.4000 mg | SUBLINGUAL_TABLET | SUBLINGUAL | Status: DC | PRN

## 2013-08-03 MED ORDER — POTASSIUM CHLORIDE CRYS ER 20 MEQ PO TBCR
20.0000 meq | EXTENDED_RELEASE_TABLET | Freq: Two times a day (BID) | ORAL | Status: DC
  Administered 2013-08-03 – 2013-08-06 (×6): 20 meq via ORAL
  Filled 2013-08-03 (×7): qty 1

## 2013-08-03 MED ORDER — FAMOTIDINE 20 MG PO TABS
20.0000 mg | ORAL_TABLET | Freq: Every day | ORAL | Status: DC
  Administered 2013-08-03 – 2013-08-05 (×3): 20 mg via ORAL
  Filled 2013-08-03 (×4): qty 1

## 2013-08-03 MED ORDER — GUAIFENESIN ER 600 MG PO TB12
1200.0000 mg | ORAL_TABLET | Freq: Two times a day (BID) | ORAL | Status: DC
  Administered 2013-08-03 – 2013-08-06 (×6): 1200 mg via ORAL
  Filled 2013-08-03 (×7): qty 2

## 2013-08-03 NOTE — Progress Notes (Signed)
[facility] PROGRESS NOTE  [name] HQN:[id number] DOB: 02/03/1946 DOA: 07/31/2013  PCP: Leonard Downing, MD  Brief HPI: [name] is a 68 y.o. female with a past medical history of Esophageal reflux; Acute asthmatic bronchitis; Rheumatoid arthritis(714.0); and Allergic rhinitis. She presented with 3 days hx of bad cough, fever up to 101.5, fatigue, she has been so weak she has trouble ambulating normally walks with cane. She has hx. of asthma followed by Dr. Annamaria Boots. 2 days ago she went to PCP who gave her prednisone 20 mg and cough suppressant. Then she went to see Dr. Annamaria Boots and was given prescription for Augmentin, Tamiflu. She was found to be febrile and had a CXR done that showed bilateral PNA at which point she was sent to ER. In ER patient was noted to be hypoxic down to 79 required up to 4 L of oxygen to maintain O2 saturation patient normally not on oxygen at home. Hospitalist was called for admission  Past medical history:  Past Medical History  Diagnosis Date  . Esophageal reflux   . Acute asthmatic bronchitis   . Rheumatoid arthritis(714.0)   . Allergic rhinitis     Consultants: Pulm  Procedures: None  Antibiotics: Vanc 1/14-->1/17 Zosyn 1/14-->1/17 Tamiflu 1/14--> Levaquin 1/15-->  Subjective: Patient continues to improve. Cough persists but breathing is better. Complains of chronic back pain which is better today. Slept better last night.  Objective: Vital Signs  Filed Vitals:   08/02/13 1957 08/02/13 2110 08/03/13 0051 08/03/13 0517  BP: 125/78  130/78 132/78  Pulse: 77 80 83 78  Temp: 98.2 F (36.8 C)  97.6 F (36.4 C) 98.4 F (36.9 C)  TempSrc: Oral  Oral Oral  Resp: _0 Height:      Weight:      SpO2: 94%  94% 93%    Intake/Output Summary (Last 24 hours) at 08/03/13 0759 Last data filed at 08/02/13 1900  Gross per 24 hour  Intake   1600 ml  Output      0 ml  Net   1600 ml   Filed Weights   07/31/13 2300 07/31/13  2325 08/01/13 1532  Weight: 78 kg (171 lb 15.3 oz) 78.2 kg (172 lb 6.4 oz) 82 kg (180 lb 12.4 oz)    General appearance: alert, cooperative, appears stated age, no distress and morbidly obese Resp: Scattered wheezing bilaterally. Crackles at bases. Much improved air entry. Cardio: regular rate and rhythm, S1, S2 normal, no murmur, click, rub or gallop GI: soft, non-tender; bowel sounds normal; no masses,  no organomegaly Neurologic: No LE weakness. No focal deficits.  Lab Results:  Basic Metabolic Panel:  Recent Labs Lab 07/31/13 1609 08/01/13 0319 08/03/13 0548  NA 134* 139 147  K 4.3 4.3 3.5*  CL 103 108 114*  CO2 17* 20 21  GLUCOSE 110* 115* 75  BUN 25* 19 7  CREATININE 0.85 0.72 0.71  CALCIUM 9.6 8.6 9.1   Liver Function Tests:  Recent Labs Lab 07/31/13 1609 08/01/13 0319  AST 38* 29  ALT 20 16  ALKPHOS 84 71  BILITOT 0.2* <0.2*  PROT 6.6 5.5*  ALBUMIN 2.9* 2.4*   CBC:  Recent Labs Lab 07/31/13 1609 08/01/13 0319 08/03/13 0548  WBC 10.8* 6.3 4.1  NEUTROABS 9.3* 5.0  --   HGB 12.8 10.7* 10.8*  HCT 38.9 33.6* 32.9*  MCV 90.3 91.8 90.1  PLT 275 229 247   BNP (last 3 results)  Recent Labs  07/31/13 1706  PROBNP 1242.0*   CBG:  Recent Labs Lab 08/01/13 0743 08/01/13 1219  GLUCAP 81 87    Recent Results (from the past 240 hour(s))  CULTURE, BLOOD (ROUTINE X 2)     Status: None   Collection Time    07/31/13  4:20 PM      Result Value Range Status   Specimen Description BLOOD RIGHT ARM   Final   Special Requests BOTTLES DRAWN AEROBIC ONLY 6CC   Final   Culture  Setup Time     Final   Value: 07/31/2013 20:45     Performed at [facility]   Culture     Final   Value:        BLOOD CULTURE RECEIVED NO GROWTH TO DATE CULTURE WILL BE HELD FOR 5 DAYS BEFORE ISSUING A FINAL NEGATIVE REPORT     Performed at [facility]   Report Status PENDING   Incomplete  CULTURE, BLOOD (ROUTINE X 2)     Status: None   Collection Time     07/31/13  4:25 PM      Result Value Range Status   Specimen Description BLOOD RIGHT HAND   Final   Special Requests BOTTLES DRAWN AEROBIC ONLY 5CC   Final   Culture  Setup Time     Final   Value: 07/31/2013 20:48     Performed at [facility]   Culture     Final   Value:        BLOOD CULTURE RECEIVED NO GROWTH TO DATE CULTURE WILL BE HELD FOR 5 DAYS BEFORE ISSUING A FINAL NEGATIVE REPORT     Performed at [facility]   Report Status PENDING   Incomplete  URINE CULTURE     Status: None   Collection Time    07/31/13  5:33 PM      Result Value Range Status   Specimen Description URINE, CLEAN CATCH   Final   Special Requests NONE   Final   Culture  Setup Time     Final   Value: 07/31/2013 19:21     Performed at [facility]     Final   Value: NO GROWTH     Performed at [facility]   Culture     Final   Value: NO GROWTH     Performed at [facility]   Report Status 08/01/2013 FINAL   Final  MRSA PCR SCREENING     Status: None   Collection Time    07/31/13 11:29 PM      Result Value Range Status   MRSA by PCR NEGATIVE  NEGATIVE Final   Comment:            The GeneXpert MRSA Assay (FDA     approved for NASAL specimens     only), is one component of a     comprehensive MRSA colonization     surveillance program. It is not     intended to diagnose MRSA     infection nor to guide or     monitor treatment for     MRSA infections.  CULTURE, EXPECTORATED SPUTUM-ASSESSMENT     Status: None   Collection Time    08/01/13  1:34 AM      Result Value Range Status   Specimen Description SPUTUM   Final   Special Requests Normal   Final   Sputum evaluation  Final   Value: THIS SPECIMEN IS ACCEPTABLE. RESPIRATORY CULTURE REPORT TO FOLLOW.   Report Status 08/01/2013 FINAL   Final  CULTURE, RESPIRATORY (NON-EXPECTORATED)     Status: None   Collection Time    08/01/13  1:34 AM      Result Value Range Status   Specimen Description  SPUTUM   Final   Special Requests NONE   Final   Gram Stain     Final   Value: FEW WBC PRESENT,BOTH PMN AND MONONUCLEAR     RARE SQUAMOUS EPITHELIAL CELLS PRESENT     RARE GRAM NEGATIVE RODS     Performed at [facility]   Culture     Final   Value: FEW CANDIDA ALBICANS     Performed at [facility]   Report Status PENDING   Incomplete  RESPIRATORY VIRUS PANEL     Status: Abnormal   Collection Time    08/01/13  2:00 AM      Result Value Range Status   Source - RVPAN NASOPHARYNGEAL   Final   Respiratory Syncytial Virus A NOT DETECTED   Final   Respiratory Syncytial Virus B NOT DETECTED   Final   Influenza A DETECTED (*)  Final   Influenza B NOT DETECTED   Final   Parainfluenza 1 NOT DETECTED   Final   Parainfluenza 2 NOT DETECTED   Final   Parainfluenza 3 NOT DETECTED   Final   Metapneumovirus NOT DETECTED   Final   Rhinovirus NOT DETECTED   Final   Adenovirus NOT DETECTED   Final   Influenza A H1 NOT DETECTED   Final   Influenza A H3 DETECTED (*)  Final   Comment: (NOTE)           Normal Reference Range for each Analyte: NOT DETECTED     Testing performed using the Luminex xTAG Respiratory Viral Panel test     kit.     This test was developed and its performance characteristics determined     by [facility]. It has not been cleared or approved by the [place]     Food and Drug Administration. This test is used for clinical purposes.     It should not be regarded as investigational or for research. This     laboratory is certified under the [place] (CLIA) as qualified to perform high complexity     clinical laboratory testing.     Performed at [facility]      Studies/Results: Dg Chest 2 View  08/02/2013   CLINICAL DATA:  Pneumonia, airspace disease, followup  EXAM: CHEST  2 VIEW  COMPARISON:  07/31/2013  FINDINGS: Enlargement of cardiac silhouette.  Slight pulmonary vascular congestion.  Persistent  focal opacity in the right upper lobe likely representing pneumonia.  Additional infiltrates are seen at the bases bilaterally.  Minimal atherosclerotic calcification aortic arch.  No gross pleural effusion or pneumothorax.  Bones demineralized.  IMPRESSION: Patchy bilateral infiltrates likely representing pneumonia.  Due to the focal nature of the right upper lobe opacity, followup exams until resolution recommended to exclude underlying mass/nodule.   Electronically Signed   By: Lavonia Dana M.D.   On: 08/02/2013 09:51    Medications:  Scheduled: . albuterol  2.5 mg Nebulization Q6H  . aspirin EC  81 mg Oral Daily  . colestipol  1.5 g Oral Q breakfast   And  . colestipol  0.5  g Oral QAC supper  . enoxaparin (LOVENOX) injection  40 mg Subcutaneous Daily  . FLUoxetine  20 mg Oral Daily  . guaiFENesin  600 mg Oral BID  . levofloxacin  750 mg Oral Daily  . mirtazapine  15 mg Oral QHS  . montelukast  10 mg Oral QHS  . oseltamivir  75 mg Oral BID  . pantoprazole  40 mg Oral Daily  . potassium chloride  20 mEq Oral BID  . potassium chloride  40 mEq Oral Once  . simvastatin  20 mg Oral q1800   Continuous: . sodium chloride 75 mL/hr at 08/02/13 2138   SYV:GCYOYOOJZ, ALPRAZolam, chlorpheniramine-HYDROcodone, famotidine, glycopyrrolate, HYDROcodone-acetaminophen, nitroGLYCERIN, traMADol  Assessment/Plan:  Active Problems:   ASTHMATIC BRONCHITIS, ACUTE   CAP (community acquired pneumonia)   Hypoxia   Influenza A with pneumonia    Influenza and CAP (community acquired pneumonia) with Hypoxia Continue current antibiotics and Tamiflu. Pulm following. Continue O2. Wheezing is better. Patient appears to be improving slowly. Mobilize. Blood cultures negative so far. Sputum culture show few candida. Will stop Vanc and Zosyn. Continue Levaquin and Tamiflu. Will need to check RA sats.  History of ASTHMATIC BRONCHITIS As above. Nebs.   SIRS Resolved. Lactic acid and PCT is normal. BP normal.  Cultures negative so far.   History of RA Stable. Has been on steroid courses in past. Not on chronic steroids.  Chronic Back Pain Improved. Tramadol started by Pulm. Mobilize. PT/OT.  Code Status: Full Code  DVT Prophylaxis: Lovenox    Family Communication: Discussed with patient Disposition Plan: Mobilize. PT/OT pending. Anticipate discharge 1/19    LOS: 3 days   [facility] Pager [phone] 08/03/2013, 7:59 AM  If 8PM-8AM, please contact night-coverage at [email or web address], password [facility]

## 2013-08-03 NOTE — Evaluation (Signed)
Physical Therapy Evaluation Patient Details Name: [name] MRN: [id number] DOB: 1946-01-15 Today's Date: 08/03/2013 Time: 9518-8416 PT Time Calculation (min): 24 min  PT Assessment / Plan / Recommendation History of Present Illness  [name] is a 68 y.o. female with a past medical history of Esophageal reflux; Acute asthmatic bronchitis; Rheumatoid arthritis(714.0); and Allergic rhinitis. She presented with 3 days hx of bad cough, fever up to 101.5, fatigue, she has been so weak she has trouble ambulating normally walks with cane. She has hx. of asthma followed by Dr. Annamaria Boots. 2 days ago she went to PCP who gave her prednisone 20 mg and cough suppressant. Then she went to see Dr. Annamaria Boots and was given prescription for Augmentin, Tamiflu. She was found to be febrile and had a CXR done that showed bilateral PNA at which point she was sent to ER. In ER patient was noted to be hypoxic down to 79 required up to 4 L of oxygen to maintain O2 saturation patient normally not on oxygen at home.  Clinical Impression  Pt adm due to the above. Presents with limitations in functional mobility secondary to deficits listed below. Pt has history of falls and is unsteady with mobility. Pt to benefit from skilled acute PT to maximize independence with mobility and reduce risk of falls. Pt encouraged to ambulate with RW to improve stability with mobility. Will need to address stair goal prior to D/C home with husband.     PT Assessment  Patient needs continued PT services    Follow Up Recommendations  Home health PT;Supervision/Assistance - 24 hour    Does the patient have the potential to tolerate intense rehabilitation      Barriers to Discharge        Equipment Recommendations  None recommended by PT    Recommendations for Other Services OT consult   Frequency Min 3X/week    Precautions / Restrictions Precautions Precautions: Fall Precaution Comments: 2 falls in past year; droplet   Restrictions Weight Bearing Restrictions: No   Pertinent Vitals/Pain No complaints pain      Mobility  Bed Mobility Overal bed mobility: Modified Independent General bed mobility comments: HOB elevated and incr time due to generalized weakness and fatigue Transfers Overall transfer level: Needs assistance Equipment used: Ambulation equipment used;Rolling walker (2 wheeled) Transfers: Sit to/from Stand Sit to Stand: Min guard General transfer comment: pt transferred from bed to standing and from lower surface toilet to standing; min guard to steady pt; cues for hand placement and sequencing; pt demo decr safety awareness with hand placement for transfers; requires UE support when transferring from lower level surface Ambulation/Gait Ambulation/Gait assistance: Min guard Ambulation Distance (Feet): 10 Feet (x2) Assistive device: Rolling walker (2 wheeled) Gait Pattern/deviations: Trunk flexed;Step-through pattern;Decreased stride length;Narrow base of support Gait velocity: decreased  Gait velocity interpretation: Below normal speed for age/gender General Gait Details: cues for gt sequencing and RW management; pt amb at decr speed due to fear of falling;  min guard to steady and for safety          PT Diagnosis: Difficulty walking;Generalized weakness  PT Problem List: Decreased strength;Decreased activity tolerance;Decreased balance;Decreased mobility;Decreased knowledge of use of DME;Decreased safety awareness;Cardiopulmonary status limiting activity PT Treatment Interventions: DME instruction;Gait training;Functional mobility training;Therapeutic exercise;Stair training;Therapeutic activities;Balance training;Neuromuscular re-education;Patient/family education     PT Goals(Current goals can be found in the care plan section) Acute Rehab PT Goals Patient Stated Goal: to not have diarrhea  PT Goal Formulation: With patient Time  For Goal Achievement: 08/17/13 Potential to  Achieve Goals: Good  Visit Information  Last PT Received On: 08/03/13 Assistance Needed: +1 History of Present Illness: [name] is a 68 y.o. female with a past medical history of Esophageal reflux; Acute asthmatic bronchitis; Rheumatoid arthritis(714.0); and Allergic rhinitis. She presented with 3 days hx of bad cough, fever up to 101.5, fatigue, she has been so weak she has trouble ambulating normally walks with cane. She has hx. of asthma followed by Dr. Annamaria Boots. 2 days ago she went to PCP who gave her prednisone 20 mg and cough suppressant. Then she went to see Dr. Annamaria Boots and was given prescription for Augmentin, Tamiflu. She was found to be febrile and had a CXR done that showed bilateral PNA at which point she was sent to ER. In ER patient was noted to be hypoxic down to 79 required up to 4 L of oxygen to maintain O2 saturation patient normally not on oxygen at home.       Prior [place] expects to be discharged to:: Private residence Living Arrangements: Spouse/significant other Available Help at Discharge: Family;Available 24 hours/day Type of Home: House Home Access: Stairs to enter [employer] of Steps: 3 Entrance Stairs-Rails:  (has "handles" ) Home Layout: One level Home Equipment: Walker - 2 wheels;Cane - single point;Bedside commode;Shower seat - built in Additional Comments: has sleep number bed that elevates  Prior Function Level of Independence: Independent with assistive device(s) Comments: pt independent with ADls and can drive, but doesnt much; pt ambulates with [facility]  Communication Communication: No difficulties Dominant Hand: Right    Cognition  Cognition Arousal/Alertness: Awake/alert Behavior During Therapy: WFL for tasks assessed/performed Overall Cognitive Status: Within Functional Limits for tasks assessed    Extremity/Trunk Assessment Upper Extremity Assessment Upper Extremity Assessment: Defer to OT  evaluation Lower Extremity Assessment Lower Extremity Assessment: Generalized weakness Cervical / Trunk Assessment Cervical / Trunk Assessment: Normal   Balance Balance Overall balance assessment: Needs assistance;History of Falls Sitting-balance support: Bilateral upper extremity supported;Feet unsupported Sitting balance-Leahy Scale: Good Standing balance support: During functional activity;Bilateral upper extremity supported Standing balance-Leahy Scale: Fair  End of Session PT - End of Session Equipment Utilized During Treatment: Gait belt;Oxygen (2L O2) Activity Tolerance: Patient tolerated treatment well Patient left: in chair;with call bell/phone within reach Nurse Communication: Mobility status  GP     Gustavus Bryant, [place] 08/03/2013, 10:23 AM

## 2013-08-03 NOTE — Progress Notes (Signed)
Name: [name] MRN: [id number] DOB: 08/06/1945    ADMISSION DATE:  07/31/2013 CONSULTATION DATE:  07/31/13  REFERRING MD :  Dr. Roel Cluck / [facility] PRIMARY SERVICE:  [facility]  CHIEF COMPLAINT:  Cough, fever, fatigue  BRIEF PATIENT DESCRIPTION: [name] admitted on 1/14 w concern for CAP, acute asthma exacerbation.  PCCM consulted for pulmonary evaluation.   SIGNIFICANT EVENTS / STUDIES:  1/14 - Admit with acute asthma exacerbation, concern for CAP  CULTURES: BCx2 1/14>>> UC 1/14>>>neg Sputum 1/14>>> Resp viral 1/14>>>++flu a UA 1/14>>>3-6 wbc, few bacteria, neg nitrate Urine strep - neg Urine leg -  neg  ANTIBIOTICS: Vanco 1/14> 1/16 Zosyn 1/14> 1/16 Tamiflu 1/15>> Levaquin 1/15>>     SUBJECTIVE:  On O2 , feels somewhat  congested  VITAL SIGNS: Temp:  [97.6 F (36.4 C)-98.4 F (36.9 C)] 98.4 F (36.9 C) (01/17 0517) Pulse Rate:  [77-83] 78 (01/17 0517) Resp:  [18-20] 18 (01/17 0517) BP: (125-132)/(78) 132/78 mmHg (01/17 0517) SpO2:  [93 %-94 %] 93 % (01/17 1437) FiO2 (%):  [28 %] 28 % (01/17 1437)  PHYSICAL EXAMINATION: General:  wdwn adult female nad at rest at 30 degrees hob Neuro:  AAOx4, speech clear, MAE HEENT:  Mm pink/moist, no jvd Cardiovascular:  s1s2 rrr, no m/r/g Lungs:  resp's even/non-labored, lungs bilaterally with junky rhonchi bilaterally Abdomen:  Round/soft, bsx4 active Musculoskeletal:  No acute deformity, pronounced joints in hands Skin:  Warm/dry, no edema   PULMONARY No results found for this basename: PHART, PCO2, PCO2ART, PO2, PO2ART, HCO3, TCO2, O2SAT,  in the last 168 hours  CBC  Recent Labs Lab 07/31/13 1609 08/01/13 0319 08/03/13 0548  HGB 12.8 10.7* 10.8*  HCT 38.9 33.6* 32.9*  WBC 10.8* 6.3 4.1  PLT 275 229 247    COAGULATION No results found for this basename: INR,  in the last 168 hours  CARDIAC  No results found for this basename: TROPONINI,  in the last 168 hours  Recent Labs Lab 07/31/13 1706  PROBNP  1242.0*     CHEMISTRY  Recent Labs Lab 07/31/13 1609 08/01/13 0319 08/03/13 0548  NA 134* 139 147  K 4.3 4.3 3.5*  CL 103 108 114*  CO2 17* 20 21  GLUCOSE 110* 115* 75  BUN 25* 19 7  CREATININE 0.85 0.72 0.71  CALCIUM 9.6 8.6 9.1   Estimated Creatinine Clearance: 65.9 ml/min (by C-G formula based on Cr of 0.71).   LIVER  Recent Labs Lab 07/31/13 1609 08/01/13 0319  AST 38* 29  ALT 20 16  ALKPHOS 84 71  BILITOT 0.2* <0.2*  PROT 6.6 5.5*  ALBUMIN 2.9* 2.4*     INFECTIOUS  Recent Labs Lab 07/31/13 1719 07/31/13 2249 07/31/13 2254 08/02/13 0548  LATICACIDVEN 0.85 1.2  --   --   PROCALCITON  --   --  0.32 0.13     ENDOCRINE CBG (last 3)   Recent Labs  08/01/13 0743 08/01/13 1219  GLUCAP 81 87         IMAGING x48h  Dg Chest 2 View  08/03/2013   CLINICAL DATA:  History of pneumonia  EXAM: CHEST  2 VIEW  COMPARISON:  PA and lateral chest film of August 02, 2013.  FINDINGS: The lungs are less well inflated today. The perihilar interstitial markings remain increased. Confluent density in the right upper lobe is less conspicuous. The left hemidiaphragm is further obscured today. There is partial obscuration of the right hemidiaphragm. The cardiac silhouette is mildly enlarged  but stable. The central pulmonary vascularity is prominent. Vertically-oriented lucency in the left lower hemi thorax is of uncertain significance and may reflect the major fissure bone and. This appears new. There is no definite evidence of a pneumothorax elsewhere.  IMPRESSION: The lungs are less well inflated today. There are persistently increased interstitial densities consistent with edema and/or pneumonia. Vertically-oriented density in the left lower hemi thorax is of uncertain significance. A repeat portable chest x-ray or preferably a PA and lateral chest x-ray would be useful to better evaluate the left lung base and to exclude a possible basilar pneumothorax.  These results  will be called to the ordering clinician or representative by the Radiologist Assistant, and communication documented in the PACS Dashboard.   Electronically Signed   By: David  [place]   On: 08/03/2013 10:48   Dg Chest 2 View  08/02/2013   CLINICAL DATA:  Pneumonia, airspace disease, followup  EXAM: CHEST  2 VIEW  COMPARISON:  07/31/2013  FINDINGS: Enlargement of cardiac silhouette.  Slight pulmonary vascular congestion.  Persistent focal opacity in the right upper lobe likely representing pneumonia.  Additional infiltrates are seen at the bases bilaterally.  Minimal atherosclerotic calcification aortic arch.  No gross pleural effusion or pneumothorax.  Bones demineralized.  IMPRESSION: Patchy bilateral infiltrates likely representing pneumonia.  Due to the focal nature of the right upper lobe opacity, followup exams until resolution recommended to exclude underlying mass/nodule.   Electronically Signed   By: Lavonia Dana M.D.   On: 08/02/2013 09:51      ASSESSMENT / PLAN:  CAP - concern for R>L airspace disease  Acute Asthma Exacerbation  A+  Influenza  Plan: -empiric tamiflu -continue current abx with immune compromised status (intermittant pred) -follow blood, sputum, urine studies -PCT protocol, narrow abx as able -pulmonary hygiene/ max mucinex/ flutter  -droplet precautions -oxygen to support sats > 93% -gentle hydration   Christinia Gully, MD Pulmonary and [place] [phone] After 5:30 PM or weekends, call [phone]

## 2013-08-03 NOTE — Progress Notes (Signed)
SATURATION QUALIFICATIONS: (This note is used to comply with regulatory documentation for home oxygen)  Patient Saturations on Room Air at Rest = 88-91%  Patient Saturations on Room Air while Ambulating = 88-89%  Patient Saturations on 2 Liters of oxygen while Ambulating = 92%  Please briefly explain why patient needs home oxygen: Desats while ambulating

## 2013-08-04 ENCOUNTER — Inpatient Hospital Stay (HOSPITAL_COMMUNITY): Payer: Medicare Other

## 2013-08-04 DIAGNOSIS — K589 Irritable bowel syndrome without diarrhea: Secondary | ICD-10-CM

## 2013-08-04 LAB — BASIC METABOLIC PANEL
BUN: 6 mg/dL (ref 6–23)
CO2: 23 mEq/L (ref 19–32)
Calcium: 9.3 mg/dL (ref 8.4–10.5)
Chloride: 110 mEq/L (ref 96–112)
Creatinine, Ser: 0.66 mg/dL (ref 0.50–1.10)
GFR calc Af Amer: >90 mL/min (ref >90)
GFR calc non Af Amer: 88 mL/min — ABNORMAL LOW (ref >90)
Glucose, Bld: 84 mg/dL (ref 70–99)
Potassium: 3.8 mEq/L (ref 3.7–5.3)
Sodium: 144 mEq/L (ref 137–147)

## 2013-08-04 LAB — PROCALCITONIN: Procalcitonin: <0.1 ng/mL

## 2013-08-04 MED ORDER — GLYCOPYRROLATE 1 MG PO TABS
1.0000 mg | ORAL_TABLET | Freq: Three times a day (TID) | ORAL | Status: DC
  Administered 2013-08-04 – 2013-08-05 (×5): 1 mg via ORAL
  Filled 2013-08-04 (×10): qty 1

## 2013-08-04 MED ORDER — COLESTIPOL HCL 1 G PO TABS: 0.5000 g | ORAL_TABLET | Freq: Every day | ORAL | Status: DC

## 2013-08-04 MED ORDER — ZOLPIDEM TARTRATE 5 MG PO TABS: 5.0000 mg | ORAL_TABLET | Freq: Every evening | ORAL | Status: DC | PRN

## 2013-08-04 MED ORDER — COLESTIPOL HCL 1 G PO TABS: 1.5000 g | ORAL_TABLET | Freq: Every day | ORAL | Status: DC

## 2013-08-04 MED ORDER — COLESTIPOL HCL 1 G PO TABS
1.0000 g | ORAL_TABLET | Freq: Two times a day (BID) | ORAL | Status: DC
  Administered 2013-08-04 – 2013-08-06 (×5): 1 g via ORAL
  Filled 2013-08-04 (×7): qty 1

## 2013-08-04 MED ORDER — ALBUTEROL SULFATE (2.5 MG/3ML) 0.083% IN NEBU
2.5000 mg | INHALATION_SOLUTION | Freq: Three times a day (TID) | RESPIRATORY_TRACT | Status: DC
  Administered 2013-08-05 – 2013-08-06 (×4): 2.5 mg via RESPIRATORY_TRACT
  Filled 2013-08-04 (×4): qty 3

## 2013-08-04 MED ORDER — METHYLPREDNISOLONE SODIUM SUCC 40 MG IJ SOLR
40.0000 mg | Freq: Two times a day (BID) | INTRAMUSCULAR | Status: DC
  Administered 2013-08-04 – 2013-08-06 (×5): 40 mg via INTRAVENOUS
  Filled 2013-08-04 (×7): qty 1

## 2013-08-04 MED ORDER — FUROSEMIDE 20 MG PO TABS
20.0000 mg | ORAL_TABLET | Freq: Every day | ORAL | Status: DC
  Administered 2013-08-04 – 2013-08-06 (×3): 20 mg via ORAL
  Filled 2013-08-04 (×3): qty 1

## 2013-08-04 NOTE — Progress Notes (Signed)
[facility] PROGRESS NOTE  [name] RZN:[id number] DOB: 13-May-1946 DOA: 07/31/2013  PCP: Leonard Downing, MD  Brief HPI: [name] is a 68 y.o. female with a past medical history of Esophageal reflux; Acute asthmatic bronchitis; Rheumatoid arthritis(714.0); and Allergic rhinitis. She presented with 3 days hx of bad cough, fever up to 101.5, fatigue, she has been so weak she has trouble ambulating normally walks with cane. She has hx. of asthma followed by Dr. Annamaria Boots. 2 days ago she went to PCP who gave her prednisone 20 mg and cough suppressant. Then she went to see Dr. Annamaria Boots and was given prescription for Augmentin, Tamiflu. She was found to be febrile and had a CXR done that showed bilateral PNA at which point she was sent to ER. In ER patient was noted to be hypoxic down to 79 required up to 4 L of oxygen to maintain O2 saturation patient normally not on oxygen at home. Hospitalist was called for admission  Past medical history:  Past Medical History  Diagnosis Date  . Esophageal reflux   . Acute asthmatic bronchitis   . Rheumatoid arthritis(714.0)   . Allergic rhinitis     Consultants: Pulm  Procedures: None  Antibiotics: Vanc 1/14-->1/17 Zosyn 1/14-->1/17 Tamiflu 1/14--> Levaquin 1/15-->  Subjective: Patient still coughing a lot. Continues to have loose stools which she attributes to IBS. Denies abdo pain.   Objective: Vital Signs  Filed Vitals:   08/03/13 1629 08/03/13 2035 08/04/13 0500 08/04/13 0908  BP: 122/78 119/73 121/76   Pulse: 86 80 78   Temp: 97.4 F (36.3 C) 98.1 F (36.7 C) 98 F (36.7 C)   TempSrc: Oral Oral Oral   Resp:  20 20   Height:      Weight:      SpO2:  97% 96% 96%    Intake/Output Summary (Last 24 hours) at 08/04/13 1110 Last data filed at 08/04/13 3013  Gross per 24 hour  Intake    220 ml  Output      0 ml  Net    220 ml   Filed Weights   07/31/13 2300 07/31/13 2325 08/01/13 1532  Weight: 78 kg (171 lb 15.3 oz)  78.2 kg (172 lb 6.4 oz) 82 kg (180 lb 12.4 oz)    General appearance: alert, cooperative, appears stated age, no distress and morbidly obese Resp: No wheezing heard today. Crackles at bases. improved air entry. Cardio: regular rate and rhythm, S1, S2 normal, no murmur, click, rub or gallop GI: soft, non-tender; bowel sounds normal; no masses,  no organomegaly Neurologic: No LE weakness. No focal deficits.  Lab Results:  Basic Metabolic Panel:  Recent Labs Lab 07/31/13 1609 08/01/13 0319 08/03/13 0548 08/04/13 0630  NA 134* 139 147 144  K 4.3 4.3 3.5* 3.8  CL 103 108 114* 110  CO2 17* _0 GLUCOSE 110* 115* 75 84  BUN 25* _1 CREATININE 0.85 0.72 0.71 0.66  CALCIUM 9.6 8.6 9.1 9.3   Liver Function Tests:  Recent Labs Lab 07/31/13 1609 08/01/13 0319  AST 38* 29  ALT 20 16  ALKPHOS 84 71  BILITOT 0.2* <0.2*  PROT 6.6 5.5*  ALBUMIN 2.9* 2.4*   CBC:  Recent Labs Lab 07/31/13 1609 08/01/13 0319 08/03/13 0548  WBC 10.8* 6.3 4.1  NEUTROABS 9.3* 5.0  --   HGB 12.8 10.7* 10.8*  HCT 38.9 33.6* 32.9*  MCV 90.3 91.8 90.1  PLT 275 229 247  BNP (last 3 results)  Recent Labs  07/31/13 1706  PROBNP 1242.0*   CBG:  Recent Labs Lab 08/01/13 0743 08/01/13 1219  GLUCAP 81 87    Recent Results (from the past 240 hour(s))  CULTURE, BLOOD (ROUTINE X 2)     Status: None   Collection Time    07/31/13  4:20 PM      Result Value Range Status   Specimen Description BLOOD RIGHT ARM   Final   Special Requests BOTTLES DRAWN AEROBIC ONLY 6CC   Final   Culture  Setup Time     Final   Value: 07/31/2013 20:45     Performed at [facility]   Culture     Final   Value:        BLOOD CULTURE RECEIVED NO GROWTH TO DATE CULTURE WILL BE HELD FOR 5 DAYS BEFORE ISSUING A FINAL NEGATIVE REPORT     Performed at [facility]   Report Status PENDING   Incomplete  CULTURE, BLOOD (ROUTINE X 2)     Status: None   Collection Time    07/31/13  4:25 PM       Result Value Range Status   Specimen Description BLOOD RIGHT HAND   Final   Special Requests BOTTLES DRAWN AEROBIC ONLY 5CC   Final   Culture  Setup Time     Final   Value: 07/31/2013 20:48     Performed at [facility]   Culture     Final   Value:        BLOOD CULTURE RECEIVED NO GROWTH TO DATE CULTURE WILL BE HELD FOR 5 DAYS BEFORE ISSUING A FINAL NEGATIVE REPORT     Performed at [facility]   Report Status PENDING   Incomplete  URINE CULTURE     Status: None   Collection Time    07/31/13  5:33 PM      Result Value Range Status   Specimen Description URINE, CLEAN CATCH   Final   Special Requests NONE   Final   Culture  Setup Time     Final   Value: 07/31/2013 19:21     Performed at [facility]     Final   Value: NO GROWTH     Performed at [facility]   Culture     Final   Value: NO GROWTH     Performed at [facility]   Report Status 08/01/2013 FINAL   Final  MRSA PCR SCREENING     Status: None   Collection Time    07/31/13 11:29 PM      Result Value Range Status   MRSA by PCR NEGATIVE  NEGATIVE Final   Comment:            The GeneXpert MRSA Assay (FDA     approved for NASAL specimens     only), is one component of a     comprehensive MRSA colonization     surveillance program. It is not     intended to diagnose MRSA     infection nor to guide or     monitor treatment for     MRSA infections.  CULTURE, EXPECTORATED SPUTUM-ASSESSMENT     Status: None   Collection Time    08/01/13  1:34 AM      Result Value Range Status   Specimen Description SPUTUM   Final   Special Requests Normal   Final  Sputum evaluation     Final   Value: THIS SPECIMEN IS ACCEPTABLE. RESPIRATORY CULTURE REPORT TO FOLLOW.   Report Status 08/01/2013 FINAL   Final  CULTURE, RESPIRATORY (NON-EXPECTORATED)     Status: None   Collection Time    08/01/13  1:34 AM      Result Value Range Status   Specimen Description SPUTUM   Final    Special Requests NONE   Final   Gram Stain     Final   Value: FEW WBC PRESENT,BOTH PMN AND MONONUCLEAR     RARE SQUAMOUS EPITHELIAL CELLS PRESENT     RARE GRAM NEGATIVE RODS     Performed at [facility]   Culture     Final   Value: FEW CANDIDA ALBICANS     Performed at [facility]   Report Status 08/03/2013 FINAL   Final  RESPIRATORY VIRUS PANEL     Status: Abnormal   Collection Time    08/01/13  2:00 AM      Result Value Range Status   Source - RVPAN NASOPHARYNGEAL   Final   Respiratory Syncytial Virus A NOT DETECTED   Final   Respiratory Syncytial Virus B NOT DETECTED   Final   Influenza A DETECTED (*)  Final   Influenza B NOT DETECTED   Final   Parainfluenza 1 NOT DETECTED   Final   Parainfluenza 2 NOT DETECTED   Final   Parainfluenza 3 NOT DETECTED   Final   Metapneumovirus NOT DETECTED   Final   Rhinovirus NOT DETECTED   Final   Adenovirus NOT DETECTED   Final   Influenza A H1 NOT DETECTED   Final   Influenza A H3 DETECTED (*)  Final   Comment: (NOTE)           Normal Reference Range for each Analyte: NOT DETECTED     Testing performed using the Luminex xTAG Respiratory Viral Panel test     kit.     This test was developed and its performance characteristics determined     by [facility]. It has not been cleared or approved by the [place]     Food and Drug Administration. This test is used for clinical purposes.     It should not be regarded as investigational or for research. This     laboratory is certified under the [place] (CLIA) as qualified to perform high complexity     clinical laboratory testing.     Performed at [facility]      Studies/Results: Dg Chest 2 View  08/03/2013   CLINICAL DATA:  History of pneumonia  EXAM: CHEST  2 VIEW  COMPARISON:  PA and lateral chest film of August 02, 2013.  FINDINGS: The lungs are less well inflated today. The perihilar interstitial markings  remain increased. Confluent density in the right upper lobe is less conspicuous. The left hemidiaphragm is further obscured today. There is partial obscuration of the right hemidiaphragm. The cardiac silhouette is mildly enlarged but stable. The central pulmonary vascularity is prominent. Vertically-oriented lucency in the left lower hemi thorax is of uncertain significance and may reflect the major fissure bone and. This appears new. There is no definite evidence of a pneumothorax elsewhere.  IMPRESSION: The lungs are less well inflated today. There are persistently increased interstitial densities consistent with edema and/or pneumonia. Vertically-oriented density in the left lower hemi thorax is of uncertain significance.  A repeat portable chest x-ray or preferably a PA and lateral chest x-ray would be useful to better evaluate the left lung base and to exclude a possible basilar pneumothorax.  These results will be called to the ordering clinician or representative by the Radiologist Assistant, and communication documented in the PACS Dashboard.   Electronically Signed   By: David  Martinique   On: 08/03/2013 10:48    Medications:  Scheduled: . albuterol  2.5 mg Nebulization Q6H  . aspirin EC  81 mg Oral Daily  . colestipol  1 g Oral BID WC  . enoxaparin (LOVENOX) injection  40 mg Subcutaneous Daily  . famotidine  20 mg Oral QHS  . FLUoxetine  20 mg Oral Daily  . furosemide  20 mg Oral Daily  . glycopyrrolate  1 mg Oral TID  . guaiFENesin  1,200 mg Oral BID  . levofloxacin  750 mg Oral Daily  . methylPREDNISolone (SOLU-MEDROL) injection  40 mg Intravenous Q12H  . mirtazapine  15 mg Oral QHS  . montelukast  10 mg Oral QHS  . oseltamivir  75 mg Oral BID  . pantoprazole  40 mg Oral Daily  . potassium chloride  20 mEq Oral BID  . simvastatin  20 mg Oral q1800   Continuous: . sodium chloride 20 mL/hr (08/03/13 1051)   GGY:IRSWNIOEV, ALPRAZolam, chlorpheniramine-HYDROcodone,  HYDROcodone-acetaminophen, nitroGLYCERIN, traMADol, zolpidem  Assessment/Plan:  Active Problems:   ASTHMATIC BRONCHITIS, ACUTE   CAP (community acquired pneumonia)   Hypoxia   Influenza A with pneumonia    Influenza and CAP (community acquired pneumonia) with Hypoxia Continue current antibiotics and Tamiflu. Pulm following. Continue O2. Will need home O2. Wheezing is better. Patient appears to be improving albeit slowly. Mobilize. Blood cultures negative so far. Sputum culture show few candida. Continue Levaquin and Tamiflu. Abnormal CXr report from 1/17 noted. Will repeat CXR today.  History of ASTHMATIC BRONCHITIS As above. Nebs.   IBS with diarrhea Change Rubinol to scheduled. Monitor. If doesn't improve will need to pursue further testing to rule out other etiology.  SIRS Resolved. Lactic acid and PCT is normal. BP normal. Cultures negative so far.   History of RA Stable. Has been on steroid courses in past. Not on chronic daily steroids.  Chronic Back Pain Improved. Tramadol started by Pulm. Mobilize. PT/OT.  Code Status: Full Code  DVT Prophylaxis: Lovenox    Family Communication: Discussed with patient Disposition Plan: PT has seen. Will need home health and home o2 on discharge. Discharge per pulm.     LOS: 4 days   [facility] Pager [phone] 08/04/2013, 11:10 AM  If 8PM-8AM, please contact night-coverage at [email or web address], password [facility]

## 2013-08-04 NOTE — Progress Notes (Addendum)
Name: [name] MRN: [id number] DOB: 01/10/1946    ADMISSION DATE:  07/31/2013 CONSULTATION DATE:  07/31/13  REFERRING MD :  Dr. Roel Cluck / [facility] PRIMARY SERVICE:  [facility]  CHIEF COMPLAINT:  Cough, fever, fatigue  BRIEF PATIENT DESCRIPTION: [name] admitted on 1/14 w concern for CAP, acute asthma exacerbation.  PCCM consulted for pulmonary evaluation.   SIGNIFICANT EVENTS / STUDIES:  1/14 - Admit with acute asthma exacerbation, concern for CAP  CULTURES: BCx2 1/14>>> UC 1/14>>>neg Sputum 1/14>>> Resp viral 1/14>>>++flu a UA 1/14>>>3-6 wbc, few bacteria, neg nitrate Urine strep - neg Urine leg -  neg  ANTIBIOTICS: Vanco 1/14> 1/16 Zosyn 1/14> 1/16 Tamiflu 1/15>> Levaquin 1/15>>     SUBJECTIVE:  On O2 2lpm np , continues to  Feel somewhat  Congested/ sputum yellow  VITAL SIGNS: Temp:  [97.4 F (36.3 C)-98.1 F (36.7 C)] 98 F (36.7 C) (01/18 0500) Pulse Rate:  [78-86] 78 (01/18 0500) Resp:  [20] 20 (01/18 0500) BP: (119-122)/(73-78) 121/76 mmHg (01/18 0500) SpO2:  [93 %-97 %] 96 % (01/18 0500) FiO2 (%):  [28 %] 28 % (01/17 1437)  PHYSICAL EXAMINATION: General:  wdwn adult female nad at rest at 30 degrees hob Neuro:  AAOx4, speech clear, MAE HEENT:  Mm pink/moist, no jvd Cardiovascular:  s1s2 rrr, no m/r/g Lungs:  resp's even/non-labored, lungs bilaterally with prominent junky/sonorous rhonchi bilaterally Abdomen:  Round/soft, bsx4 active Musculoskeletal:  No acute deformity, pronounced joints in hands Skin:  Warm/dry, no edema   PULMONARY No results found for this basename: PHART, PCO2, PCO2ART, PO2, PO2ART, HCO3, TCO2, O2SAT,  in the last 168 hours  CBC  Recent Labs Lab 07/31/13 1609 08/01/13 0319 08/03/13 0548  HGB 12.8 10.7* 10.8*  HCT 38.9 33.6* 32.9*  WBC 10.8* 6.3 4.1  PLT 275 229 247    COAGULATION No results found for this basename: INR,  in the last 168 hours  CARDIAC  No results found for this basename: TROPONINI,  in the last 168  hours  Recent Labs Lab 07/31/13 1706  PROBNP 1242.0*     CHEMISTRY  Recent Labs Lab 07/31/13 1609 08/01/13 0319 08/03/13 0548 08/04/13 0630  NA 134* 139 147 144  K 4.3 4.3 3.5* 3.8  CL 103 108 114* 110  CO2 17* _0 GLUCOSE 110* 115* 75 84  BUN 25* _1 CREATININE 0.85 0.72 0.71 0.66  CALCIUM 9.6 8.6 9.1 9.3   Estimated Creatinine Clearance: 65.9 ml/min (by C-G formula based on Cr of 0.66).   LIVER  Recent Labs Lab 07/31/13 1609 08/01/13 0319  AST 38* 29  ALT 20 16  ALKPHOS 84 71  BILITOT 0.2* <0.2*  PROT 6.6 5.5*  ALBUMIN 2.9* 2.4*     INFECTIOUS  Recent Labs Lab 07/31/13 1719 07/31/13 2249 07/31/13 2254 08/02/13 0548  LATICACIDVEN 0.85 1.2  --   --   PROCALCITON  --   --  0.32 0.13     ENDOCRINE CBG (last 3)   Recent Labs  08/01/13 1219  GLUCAP 87         IMAGING x48h  Dg Chest 2 View  08/03/2013   CLINICAL DATA:  History of pneumonia  EXAM: CHEST  2 VIEW  COMPARISON:  PA and lateral chest film of August 02, 2013.  FINDINGS: The lungs are less well inflated today. The perihilar interstitial markings remain increased. Confluent density in the right upper lobe is less conspicuous. The left hemidiaphragm is further obscured  today. There is partial obscuration of the right hemidiaphragm. The cardiac silhouette is mildly enlarged but stable. The central pulmonary vascularity is prominent. Vertically-oriented lucency in the left lower hemi thorax is of uncertain significance and may reflect the major fissure bone and. This appears new. There is no definite evidence of a pneumothorax elsewhere.  IMPRESSION: The lungs are less well inflated today. There are persistently increased interstitial densities consistent with edema and/or pneumonia. Vertically-oriented density in the left lower hemi thorax is of uncertain significance. A repeat portable chest x-ray or preferably a PA and lateral chest x-ray would be useful to better evaluate the  left lung base and to exclude a possible basilar pneumothorax.  These results will be called to the ordering clinician or representative by the Radiologist Assistant, and communication documented in the PACS Dashboard.   Electronically Signed   By: David  Martinique   On: 08/03/2013 10:48   Dg Chest 2 View  08/02/2013   CLINICAL DATA:  Pneumonia, airspace disease, followup  EXAM: CHEST  2 VIEW  COMPARISON:  07/31/2013  FINDINGS: Enlargement of cardiac silhouette.  Slight pulmonary vascular congestion.  Persistent focal opacity in the right upper lobe likely representing pneumonia.  Additional infiltrates are seen at the bases bilaterally.  Minimal atherosclerotic calcification aortic arch.  No gross pleural effusion or pneumothorax.  Bones demineralized.  IMPRESSION: Patchy bilateral infiltrates likely representing pneumonia.  Due to the focal nature of the right upper lobe opacity, followup exams until resolution recommended to exclude underlying mass/nodule.   Electronically Signed   By: Lavonia Dana M.D.   On: 08/02/2013 09:51      ASSESSMENT / PLAN:  CAP - concern for R>L airspace disease  Acute Asthma Exacerbation  A+  Influenza  Plan: -continue  tamiflu -continue current abx with immune compromised status (intermittant pred)  -pulmonary hygiene/ max mucinex/ flutter  -droplet precautions -oxygen to support sats > 93% - started low dose solumedrol 1/18 for persistent wheeze/rhonchi     Christinia Gully, MD [facility] [place] [phone] After 5:30 PM or weekends, call [phone]

## 2013-08-05 LAB — BASIC METABOLIC PANEL
BUN: 8 mg/dL (ref 6–23)
CO2: 23 mEq/L (ref 19–32)
Calcium: 9.3 mg/dL (ref 8.4–10.5)
Chloride: 107 mEq/L (ref 96–112)
Creatinine, Ser: 0.6 mg/dL (ref 0.50–1.10)
GFR calc Af Amer: >90 mL/min (ref >90)
GFR calc non Af Amer: >90 mL/min (ref >90)
Glucose, Bld: 144 mg/dL — ABNORMAL HIGH (ref 70–99)
Potassium: 4.3 mEq/L (ref 3.7–5.3)
Sodium: 143 mEq/L (ref 137–147)

## 2013-08-05 MED ORDER — TRAZODONE HCL 50 MG PO TABS
50.0000 mg | ORAL_TABLET | Freq: Every evening | ORAL | Status: DC | PRN
  Administered 2013-08-05: 50 mg via ORAL
  Filled 2013-08-05: qty 1

## 2013-08-05 NOTE — Evaluation (Signed)
Occupational Therapy Evaluation Patient Details Name: [name] MRN: [id number] DOB: Jan 26, 1946 Today's Date: 08/05/2013 Time: 3154-0086 OT Time Calculation (min): 24 min  OT Assessment / Plan / Recommendation History of present illness 68 y.o. female presenting with 3 days of bad cough and fever 101.5 with fatigue and incr trouble ambulating. Pt at baseline ambulates with cane. Pt has hx of Esophageal reflux; Acute asthmatic bronchitis; Rheumatoid arthritis(714.0); and Allergic rhinitis. In ER noted to be hypoxic with oxygen 79 % requiring 4L O2 to maintain saturations   Clinical Impression   PT admitted with hypoxic, decr gait stability, fever > 100 and coughing. Pt currently with functional limitiations due to the deficits listed below (see OT problem list).  Pt will benefit from skilled OT to increase their independence and safety with adls and balance to allow discharge [place]. Pt demonstrates decr activity tolerance and decr oxygen saturations on RA.     OT Assessment  Patient needs continued OT Services    Follow Up Recommendations  Home health OT;Supervision - Intermittent    Barriers to Discharge      Equipment Recommendations  None recommended by OT    Recommendations for Other Services    Frequency  Min 2X/week    Precautions / Restrictions Precautions Precautions: Fall Precaution Comments: 2 falls in past year; droplet    Pertinent Vitals/Pain Oxygen 92% on nasal cannula HR 90s    ADL  Eating/Feeding: Modified independent Where Assessed - Eating/Feeding: Chair Grooming: Wash/dry hands;Wash/dry face;Teeth care;Min guard Where Assessed - Grooming: Unsupported standing Toilet Transfer: Min Psychiatric nurse Method: Sit to Loss adjuster, chartered: Raised toilet seat with arms (or 3-in-1 over toilet) Toileting - Clothing Manipulation and Hygiene: Min guard Where Assessed - Toileting Clothing Manipulation and Hygiene: Sit to stand from 3-in-1 or  toilet Equipment Used: Rolling walker;Other (comment) (oxygen) Transfers/Ambulation Related to ADLs: Pt transfered EOB to 3n1 Min guard (A) and stand <>sit min guard (A) ADL Comments: pt supine on arrival and agreeable to OOB. pt voiding bladder. Pt ambulated to sink surface with min guard (A) and therapist managing oxgyen line. Pt coughing up secretions during session s/p ambulation    OT Diagnosis: Generalized weakness  OT Problem List: Decreased strength;Decreased activity tolerance;Impaired balance (sitting and/or standing);Decreased safety awareness;Decreased knowledge of use of DME or AE;Decreased knowledge of precautions OT Treatment Interventions: Self-care/ADL training;Therapeutic exercise;DME and/or AE instruction;Energy conservation;Therapeutic activities;Patient/family education;Balance training   OT Goals(Current goals can be found in the care plan section) Acute Rehab OT Goals Patient Stated Goal: to get back to playing golf- the new medication for  Rheumatoid arthritis really helped a lot OT Goal Formulation: With patient Time For Goal Achievement: 08/19/13 Potential to Achieve Goals: Good  Visit Information  Last OT Received On: 08/05/13 Assistance Needed: +1 History of Present Illness: 67 y.o. female presenting with 3 days of bad cough and fever 101.5 with fatigue and incr trouble ambulating. Pt at baseline ambulates with cane. Pt has hx of Esophageal reflux; Acute asthmatic bronchitis; Rheumatoid arthritis(714.0); and Allergic rhinitis. In ER noted to be hypoxic with oxygen 79 % requiring 4L O2 to maintain saturations       Prior [place] expects to be discharged to:: Private residence Living Arrangements: Spouse/significant other Available Help at Discharge: Family;Available 24 hours/day Type of Home: House Home Access: Stairs to enter [employer] of Steps: 3 Home Layout: One level Home Equipment: Walker - 2 wheels;Cane  - single point;Bedside commode;Shower seat -  built in;Grab bars - tub/shower Additional Comments: has sleep number bed that elevates  Prior Function Level of Independence: Independent with assistive device(s) Comments: pt independent with ADls and can drive, but doesnt much; pt ambulates with SPC  Communication Communication: No difficulties Dominant Hand: Right         Vision/Perception Vision - History Baseline Vision: Wears glasses only for reading Patient Visual Report: No change from baseline   Cognition  Cognition Arousal/Alertness: Awake/alert Behavior During Therapy: WFL for tasks assessed/performed Overall Cognitive Status: Within Functional Limits for tasks assessed    Extremity/Trunk Assessment Upper Extremity Assessment Upper Extremity Assessment: Overall WFL for tasks assessed (hx of  Rheumatoid arthritis) Lower Extremity Assessment Lower Extremity Assessment: Defer to PT evaluation Cervical / Trunk Assessment Cervical / Trunk Assessment: Normal     Mobility Bed Mobility Overal bed mobility: Modified Independent General bed mobility comments: HOB ~20 degrees and bed rail used to exit bed Transfers Overall transfer level: Needs assistance Equipment used: Rolling walker (2 wheeled) Transfers: Sit to/from Stand Sit to Stand: Min guard General transfer comment: cues for hand placement     Exercise     Balance Balance Standing balance support: No upper extremity supported;During functional activity Standing balance-Leahy Scale: Fair   End of Session OT - End of Session Activity Tolerance: Patient tolerated treatment well Patient left: in chair;with call bell/phone within reach Nurse Communication: Mobility status;Precautions  GO     Peri Maris 08/05/2013, 3:05 PM Pager: [phone]

## 2013-08-05 NOTE — Progress Notes (Signed)
  SATURATION QUALIFICATIONS: (This note is used to comply with regulatory documentation for home oxygen)  Patient Saturations on Room Air at Rest = 94%  Patient Saturations on Room Air while Ambulating = 89-91%  Patient Saturations on - Liters of oxygen while Ambulating = -% N/A    Saturations did not drop below 89% on RA   08/05/2013 Barry Brunner, PT Pager: [phone]

## 2013-08-05 NOTE — Progress Notes (Signed)
[facility] PROGRESS NOTE  [name] JUV:[id number] DOB: 1945/08/30 DOA: 07/31/2013  PCP: Leonard Downing, MD  Brief HPI: [name] is a 68 y.o. female with a past medical history of Esophageal reflux; Acute asthmatic bronchitis; Rheumatoid arthritis(714.0); and Allergic rhinitis. She presented with 3 days hx of bad cough, fever up to 101.5, fatigue, she has been so weak she has trouble ambulating normally walks with cane. She has hx. of asthma followed by Dr. Annamaria Boots. 2 days ago she went to PCP who gave her prednisone 20 mg and cough suppressant. Then she went to see Dr. Annamaria Boots and was given prescription for Augmentin, Tamiflu. She was found to be febrile and had a CXR done that showed bilateral PNA at which point she was sent to ER. In ER patient was noted to be hypoxic down to 79 required up to 4 L of oxygen to maintain O2 saturation patient normally not on oxygen at home. Hospitalist was called for admission  Past medical history:  Past Medical History  Diagnosis Date  . Esophageal reflux   . Acute asthmatic bronchitis   . Rheumatoid arthritis(714.0)   . Allergic rhinitis     Consultants: Pulm  Procedures: None  Antibiotics: Vanc 1/14-->1/17 Zosyn 1/14-->1/17 Tamiflu 1/14--> Levaquin 1/15-->  Subjective: Overnight events noted. Didn't have her oxygen on last night. Diarrhea is much better. No loose stool since last night. Patient still coughing but better.   Objective: Vital Signs  Filed Vitals:   08/04/13 1329 08/04/13 1359 08/04/13 2100 08/05/13 0550  BP: 144/89  124/69 121/66  Pulse: 82  74 72  Temp: 98.7 F (37.1 C)  98.6 F (37 C) 98.6 F (37 C)  TempSrc: Axillary  Oral Oral  Resp: _0 Height:      Weight:      SpO2: 95% 93% 96% 96%    Intake/Output Summary (Last 24 hours) at 08/05/13 0826 Last data filed at 08/05/13 0530  Gross per 24 hour  Intake 466.34 ml  Output      0 ml  Net 466.34 ml   Filed Weights   07/31/13 2300  07/31/13 2325 08/01/13 1532  Weight: 78 kg (171 lb 15.3 oz) 78.2 kg (172 lb 6.4 oz) 82 kg (180 lb 12.4 oz)    General appearance: alert, cooperative, appears stated age, no distress and morbidly obese Resp: No wheezing heard today. Crackles at bases. improved air entry. Cardio: regular rate and rhythm, S1, S2 normal, no murmur, click, rub or gallop GI: soft, non-tender; bowel sounds normal; no masses,  no organomegaly Neurologic: No LE weakness. No focal deficits.  Lab Results:  Basic Metabolic Panel:  Recent Labs Lab 07/31/13 1609 08/01/13 0319 08/03/13 0548 08/04/13 0630 08/05/13 0530  NA 134* 139 147 144 143  K 4.3 4.3 3.5* 3.8 4.3  CL 103 108 114* 110 107  CO2 17* _1 GLUCOSE 110* 115* 75 84 144*  BUN 25* _2 CREATININE 0.85 0.72 0.71 0.66 0.60  CALCIUM 9.6 8.6 9.1 9.3 9.3   Liver Function Tests:  Recent Labs Lab 07/31/13 1609 08/01/13 0319  AST 38* 29  ALT 20 16  ALKPHOS 84 71  BILITOT 0.2* <0.2*  PROT 6.6 5.5*  ALBUMIN 2.9* 2.4*   CBC:  Recent Labs Lab 07/31/13 1609 08/01/13 0319 08/03/13 0548  WBC 10.8* 6.3 4.1  NEUTROABS 9.3* 5.0  --   HGB 12.8 10.7* 10.8*  HCT 38.9 33.6* 32.9*  MCV 90.3 91.8 90.1  PLT 275 229 247   BNP (last 3 results)  Recent Labs  07/31/13 1706  PROBNP 1242.0*   CBG:  Recent Labs Lab 08/01/13 0743 08/01/13 1219  GLUCAP 81 87    Recent Results (from the past 240 hour(s))  CULTURE, BLOOD (ROUTINE X 2)     Status: None   Collection Time    07/31/13  4:20 PM      Result Value Range Status   Specimen Description BLOOD RIGHT ARM   Final   Special Requests BOTTLES DRAWN AEROBIC ONLY 6CC   Final   Culture  Setup Time     Final   Value: 07/31/2013 20:45     Performed at [facility]   Culture     Final   Value:        BLOOD CULTURE RECEIVED NO GROWTH TO DATE CULTURE WILL BE HELD FOR 5 DAYS BEFORE ISSUING A FINAL NEGATIVE REPORT     Performed at [facility]   Report Status PENDING    Incomplete  CULTURE, BLOOD (ROUTINE X 2)     Status: None   Collection Time    07/31/13  4:25 PM      Result Value Range Status   Specimen Description BLOOD RIGHT HAND   Final   Special Requests BOTTLES DRAWN AEROBIC ONLY 5CC   Final   Culture  Setup Time     Final   Value: 07/31/2013 20:48     Performed at [facility]   Culture     Final   Value:        BLOOD CULTURE RECEIVED NO GROWTH TO DATE CULTURE WILL BE HELD FOR 5 DAYS BEFORE ISSUING A FINAL NEGATIVE REPORT     Performed at [facility]   Report Status PENDING   Incomplete  URINE CULTURE     Status: None   Collection Time    07/31/13  5:33 PM      Result Value Range Status   Specimen Description URINE, CLEAN CATCH   Final   Special Requests NONE   Final   Culture  Setup Time     Final   Value: 07/31/2013 19:21     Performed at [place]     Final   Value: NO GROWTH     Performed at [facility]   Culture     Final   Value: NO GROWTH     Performed at [facility]   Report Status 08/01/2013 FINAL   Final  MRSA PCR SCREENING     Status: None   Collection Time    07/31/13 11:29 PM      Result Value Range Status   MRSA by PCR NEGATIVE  NEGATIVE Final   Comment:            The GeneXpert MRSA Assay (FDA     approved for NASAL specimens     only), is one component of a     comprehensive MRSA colonization     surveillance program. It is not     intended to diagnose MRSA     infection nor to guide or     monitor treatment for     MRSA infections.  CULTURE, EXPECTORATED SPUTUM-ASSESSMENT     Status: None   Collection Time    08/01/13  1:34 AM      Result Value Range Status   Specimen Description SPUTUM  Final   Special Requests Normal   Final   Sputum evaluation     Final   Value: THIS SPECIMEN IS ACCEPTABLE. RESPIRATORY CULTURE REPORT TO FOLLOW.   Report Status 08/01/2013 FINAL   Final  CULTURE, RESPIRATORY (NON-EXPECTORATED)     Status: None   Collection  Time    08/01/13  1:34 AM      Result Value Range Status   Specimen Description SPUTUM   Final   Special Requests NONE   Final   Gram Stain     Final   Value: FEW WBC PRESENT,BOTH PMN AND MONONUCLEAR     RARE SQUAMOUS EPITHELIAL CELLS PRESENT     RARE GRAM NEGATIVE RODS     Performed at [facility]   Culture     Final   Value: FEW CANDIDA ALBICANS     Performed at [facility]   Report Status 08/03/2013 FINAL   Final  RESPIRATORY VIRUS PANEL     Status: Abnormal   Collection Time    08/01/13  2:00 AM      Result Value Range Status   Source - RVPAN NASOPHARYNGEAL   Final   Respiratory Syncytial Virus A NOT DETECTED   Final   Respiratory Syncytial Virus B NOT DETECTED   Final   Influenza A DETECTED (*)  Final   Influenza B NOT DETECTED   Final   Parainfluenza 1 NOT DETECTED   Final   Parainfluenza 2 NOT DETECTED   Final   Parainfluenza 3 NOT DETECTED   Final   Metapneumovirus NOT DETECTED   Final   Rhinovirus NOT DETECTED   Final   Adenovirus NOT DETECTED   Final   Influenza A H1 NOT DETECTED   Final   Influenza A H3 DETECTED (*)  Final   Comment: (NOTE)           Normal Reference Range for each Analyte: NOT DETECTED     Testing performed using the Luminex xTAG Respiratory Viral Panel test     kit.     This test was developed and its performance characteristics determined     by [facility]. It has not been cleared or approved by the [place]     Food and Drug Administration. This test is used for clinical purposes.     It should not be regarded as investigational or for research. This     laboratory is certified under the [place] (CLIA) as qualified to perform high complexity     clinical laboratory testing.     Performed at [facility]      Studies/Results: Dg Chest 2 View  08/04/2013   CLINICAL DATA:  Followup abnormalities in January 17th  EXAM: CHEST  2 VIEW  COMPARISON:  08/03/2013   FINDINGS: Stable mild cardiac enlargement. Stable vascular congestion. Mild right lower lobe atelectasis. Right lung otherwise clear. Thin linear density previously seen in the left lung base in the retrocardiac area is now seen as a more pyramidal triangular opacity of with its base on the diaphragmatic surface of the pleura. This is not seen well on the lateral view. There remains extensive lower lobe consolidation seen on the lateral view. There is a small left pleural effusion  IMPRESSION: Persistent abnormalities including stable cardiac enlargement and vascular congestion. There is retrocardiac left lower lobe airspace disease. A triangular opacity at the left base likely represents subsegmental atelectasis. There is also fairly  widespread alveolar opacification in the left lower lobe, with a small left effusion.   Electronically Signed   By: Skipper Cliche M.D.   On: 08/04/2013 13:28   Dg Chest 2 View  08/03/2013   CLINICAL DATA:  History of pneumonia  EXAM: CHEST  2 VIEW  COMPARISON:  PA and lateral chest film of August 02, 2013.  FINDINGS: The lungs are less well inflated today. The perihilar interstitial markings remain increased. Confluent density in the right upper lobe is less conspicuous. The left hemidiaphragm is further obscured today. There is partial obscuration of the right hemidiaphragm. The cardiac silhouette is mildly enlarged but stable. The central pulmonary vascularity is prominent. Vertically-oriented lucency in the left lower hemi thorax is of uncertain significance and may reflect the major fissure bone and. This appears new. There is no definite evidence of a pneumothorax elsewhere.  IMPRESSION: The lungs are less well inflated today. There are persistently increased interstitial densities consistent with edema and/or pneumonia. Vertically-oriented density in the left lower hemi thorax is of uncertain significance. A repeat portable chest x-ray or preferably a PA and lateral chest  x-ray would be useful to better evaluate the left lung base and to exclude a possible basilar pneumothorax.  These results will be called to the ordering clinician or representative by the Radiologist Assistant, and communication documented in the PACS Dashboard.   Electronically Signed   By: David  Martinique   On: 08/03/2013 10:48    Medications:  Scheduled: . albuterol  2.5 mg Nebulization TID  . aspirin EC  81 mg Oral Daily  . colestipol  1 g Oral BID WC  . enoxaparin (LOVENOX) injection  40 mg Subcutaneous Daily  . famotidine  20 mg Oral QHS  . FLUoxetine  20 mg Oral Daily  . furosemide  20 mg Oral Daily  . glycopyrrolate  1 mg Oral TID  . guaiFENesin  1,200 mg Oral BID  . levofloxacin  750 mg Oral Daily  . methylPREDNISolone (SOLU-MEDROL) injection  40 mg Intravenous Q12H  . mirtazapine  15 mg Oral QHS  . montelukast  10 mg Oral QHS  . oseltamivir  75 mg Oral BID  . pantoprazole  40 mg Oral Daily  . potassium chloride  20 mEq Oral BID  . simvastatin  20 mg Oral q1800   Continuous: . sodium chloride 20 mL/hr (08/03/13 1051)   HYW:VPXTGGYIR, ALPRAZolam, chlorpheniramine-HYDROcodone, HYDROcodone-acetaminophen, nitroGLYCERIN, traMADol, traZODone  Assessment/Plan:  Active Problems:   ASTHMATIC BRONCHITIS, ACUTE   CAP (community acquired pneumonia)   Hypoxia   Influenza A with pneumonia    Influenza and CAP (community acquired pneumonia) with Hypoxia Continue current antibiotics and Tamiflu. Pulm following. Continue O2. Will need home O2. Wheezing is better. Patient appears to be improving albeit slowly. Mobilize. Blood cultures negative so far. Sputum culture show few candida. Continue Levaquin and Tamiflu. Abnormal CXr report from 1/17 noted and it was repeated yesterday. No pneumothorax. Unclear what happened last night. Ambien could have contributed. Will stop.   History of ASTHMATIC BRONCHITIS As above. Nebs.   IBS with diarrhea Much improved. No loose stool since  yesterday. Continue Robinul. Probably due to combination of IBS and antibiotics. Since it is better no need for further evaluation.   SIRS Resolved. Lactic acid and PCT is normal. BP normal. Cultures negative so far.   History of RA Stable. Has been on steroid courses in past. Not on chronic daily steroids.  Chronic Back Pain Improved. On Tramadol. Mobilize. PT/OT ongoing.  Code Status: Full Code  DVT Prophylaxis: Lovenox    Family Communication: Discussed with patient Disposition Plan: Will need home health and home o2 on discharge. Discharge per pulm. Anticipate DC 1/20.    LOS: 5 days   [facility] Pager [phone] 08/05/2013, 8:26 AM  If 8PM-8AM, please contact night-coverage at [email or web address], password [name]

## 2013-08-05 NOTE — Progress Notes (Signed)
PT Cancellation Note  Patient Details Name: [name] MRN: [id number] DOB: Nov 18, 1945   Cancelled Treatment:    Reason Treat Not Completed: Fatigue limiting ability to participate. Pt just back to bed < 5 minutes after sitting up for several hours and walking in room with nursing. Pt reports she may go home tomorrow and feels she "likely" is ready from a mobility standpoint.   Will attempt to see later today as schedule allows.   Seldon Barrell 08/05/2013, 2:33 PM Pager [phone]

## 2013-08-05 NOTE — Progress Notes (Signed)
PT stated patient had walking O2 sats of 89-9%0 on RA, removed 02 and monitoring for desaturation. Pt also stated urinates a scant amount while coughing, educated on kegals with verbal teachback.

## 2013-08-05 NOTE — Significant Event (Signed)
Rapid Response Event Note Pt with episodes of decreased O2sats Overview: Time Called: 0155 Arrival Time: 0158 Event Type: Respiratory  Initial Focused Assessment: Observed pt with she was sleeping.  Pt has episodes of mild sleep apnea in which she drops her sats from middle to low 90s to middle to 80s.  Pt was placed on 35%VM with equivalent of 4L [place].  Pt maintaining sats in low to middle 90s.  Will cont. To monitor  Interventions: Placed on VM  Event Summary:   at      at    Outcome: Stayed in room and stabalized     Uday Jantz Hedgecock

## 2013-08-05 NOTE — Progress Notes (Addendum)
Pt's sats kept dropping to 80s while pt asleep. Paged Rapid Response. Rapid Response nurse April to bedside. Pt placed on ventimask at 30%. Pt's sats up to 94%. Will continue to monitor pt.

## 2013-08-05 NOTE — Progress Notes (Signed)
Physical Therapy Treatment Patient Details Name: [name] MRN: [id number] DOB: 1946-03-01 Today's Date: 08/05/2013 Time: 2831-5176 PT Time Calculation (min): 29 min  PT Assessment / Plan / Recommendation  History of Present Illness 68 y.o. female presenting with 3 days of bad cough and fever 101.5 with fatigue and incr trouble ambulating. Pt at baseline ambulates with cane. Pt has hx of Esophageal reflux; Acute asthmatic bronchitis; Rheumatoid arthritis(714.0); and Allergic rhinitis. 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 Tolerated ambulation on RA with SaO2 89-91% (at rest on RA 94%). Agree with HHPT to assist pt with strengthening, balance, and regaining independence.    Follow Up Recommendations  Home health PT;Supervision/Assistance - 24 hour     Does the patient have the potential to tolerate intense rehabilitation     Barriers to Discharge        Equipment Recommendations  None recommended by PT    Recommendations for Other Services    Frequency Min 3X/week   Progress towards PT Goals Progress towards PT goals: Progressing toward goals  Plan Current plan remains appropriate    Precautions / Restrictions Precautions Precautions: Fall Precaution Comments: 2 falls in past year; droplet    Pertinent Vitals/Pain See separate O2 qualification note and doc flowsheets.    Mobility  Bed Mobility Overal bed mobility: Modified Independent General bed mobility comments: HOB ~20 degrees  Transfers Overall transfer level: Modified independent Equipment used: Rolling walker (2 wheeled) Transfers: Sit to/from Stand Sit to Stand: Modified independent (Device/Increase time) General transfer comment: x3 from various surfaces Ambulation/Gait Ambulation/Gait assistance: Supervision Ambulation Distance (Feet): 200 Feet Assistive device:  Rolling walker (2 wheeled) Gait Pattern/deviations: Step-through pattern General Gait Details: pt negotiated RW around obstacles and through tight spaces without difficulty    Exercises     PT Diagnosis:    PT Problem List:   PT Treatment Interventions:     PT Goals (current goals can now be found in the care plan section) Acute Rehab PT Goals Patient Stated Goal: to get back to playing golf- the new medication for  Rheumatoid arthritis really helped a lot  Visit Information  Last PT Received On: 08/05/13 Assistance Needed: +1 Reason Eval/Treat Not Completed: Fatigue/lethargy limiting ability to participate History of Present Illness: 68 y.o. female presenting with 3 days of bad cough and fever 101.5 with fatigue and incr trouble ambulating. Pt at baseline ambulates with cane. Pt has hx of Esophageal reflux; Acute asthmatic bronchitis; Rheumatoid arthritis(714.0); and Allergic rhinitis. In ER noted to be hypoxic with oxygen 79 % requiring 4L O2 to maintain saturations    Subjective Data  Patient Stated Goal: to get back to playing golf- the new medication for  Rheumatoid arthritis really helped a lot   Cognition  Cognition Arousal/Alertness: Awake/alert Behavior During Therapy: WFL for tasks assessed/performed Overall Cognitive Status: Within Functional Limits for tasks assessed    Balance  Balance Standing balance support: No upper extremity supported;During functional activity Standing balance-Leahy Scale: Fair  End of Session PT - End of Session Activity Tolerance: Patient tolerated treatment well Patient left: in chair;with call bell/phone within reach;with family/visitor present   GP     Iley Deignan 08/05/2013, 5:36 PM Pager [phone]

## 2013-08-05 NOTE — Progress Notes (Signed)
Pt's O2 sats keep decreasing to mid to low 80's. Paged Rapid Response. Nurse said she would come by. Will continue to monitor.

## 2013-08-06 DIAGNOSIS — J209 Acute bronchitis, unspecified: Secondary | ICD-10-CM

## 2013-08-06 DIAGNOSIS — J45909 Unspecified asthma, uncomplicated: Secondary | ICD-10-CM

## 2013-08-06 LAB — CULTURE, BLOOD (ROUTINE X 2)
Culture: NO GROWTH
Culture: NO GROWTH

## 2013-08-06 MED ORDER — PREDNISONE 10 MG PO TABS: ORAL_TABLET | ORAL | Status: DC

## 2013-08-06 MED ORDER — GUAIFENESIN ER 600 MG PO TB12: 1200.0000 mg | ORAL_TABLET | Freq: Two times a day (BID) | ORAL | Status: DC

## 2013-08-06 MED ORDER — LEVOFLOXACIN 750 MG PO TABS: 750.0000 mg | ORAL_TABLET | Freq: Every day | ORAL | Status: DC

## 2013-08-06 MED ORDER — ALBUTEROL SULFATE (2.5 MG/3ML) 0.083% IN NEBU: 2.5000 mg | INHALATION_SOLUTION | Freq: Four times a day (QID) | RESPIRATORY_TRACT | Status: DC | PRN

## 2013-08-06 MED ORDER — LEVOFLOXACIN 750 MG PO TABS
750.0000 mg | ORAL_TABLET | Freq: Every day | ORAL | Status: DC
Start: 2013-08-06 — End: 2013-08-06
  Administered 2013-08-06: 750 mg via ORAL
  Filled 2013-08-06: qty 1

## 2013-08-06 NOTE — Progress Notes (Signed)
Name: [name] MRN: [id number] DOB: August 30, 1945    ADMISSION DATE:  07/31/2013 CONSULTATION DATE:  07/31/13  REFERRING MD :  Dr. Roel Cluck / [facility]  CHIEF COMPLAINT:  Cough, fever, fatigue  BRIEF PATIENT DESCRIPTION: [name] admitted on 1/14 w concern for CAP, acute asthma exacerbation.  PCCM consulted for pulmonary evaluation.   SIGNIFICANT EVENTS / STUDIES:  1/14 - Admit with acute asthma exacerbation, concern for CAP  CULTURES: BCx2 1/14>>> UC 1/14>>>neg Sputum 1/14>>> Resp viral 1/14>>>++flu a UA 1/14>>>3-6 wbc, few bacteria, neg nitrate Urine strep - neg Urine leg -  neg  ANTIBIOTICS: Vanco 1/14> 1/16 Zosyn 1/14> 1/16 Tamiflu 1/15>>1/19 Levaquin 1/15>>   SUBJECTIVE: Still has cough with yellow sputum, and sinus congestion.  Denies chest tightness/wheeze.  Having trouble sleeping in hospital.  Anxious to go home.  VITAL SIGNS: Temp:  [97.9 F (36.6 C)-99 F (37.2 C)] 97.9 F (36.6 C) (01/20 0539) Pulse Rate:  [92-98] 92 (01/20 0539) Resp:  [16-20] 16 (01/20 0539) BP: (120-151)/(64-86) 151/86 mmHg (01/20 0539) SpO2:  [89 %-97 %] 97 % (01/20 0903) 1 liter [place]  PHYSICAL EXAMINATION: General:  No distress Neuro: normal strenght HEENT: no sinus tenderness Cardiovascular:  regular Lungs:  No wheeze Abdomen:  Soft, non tender Musculoskeletal: no edema Skin: no rashes  CBC  Recent Labs Lab 07/31/13 1609 08/01/13 0319 08/03/13 0548  HGB 12.8 10.7* 10.8*  HCT 38.9 33.6* 32.9*  WBC 10.8* 6.3 4.1  PLT 275 229 247    CARDIAC  No results found for this basename: TROPONINI,  in the last 168 hours  Recent Labs Lab 07/31/13 1706  PROBNP 1242.0*     CHEMISTRY  Recent Labs Lab 07/31/13 1609 08/01/13 0319 08/03/13 0548 08/04/13 0630 08/05/13 0530  NA 134* 139 147 144 143  K 4.3 4.3 3.5* 3.8 4.3  CL 103 108 114* 110 107  CO2 17* _0 GLUCOSE 110* 115* 75 84 144*  BUN 25* _1 CREATININE 0.85 0.72 0.71 0.66 0.60  CALCIUM 9.6 8.6  9.1 9.3 9.3   Estimated Creatinine Clearance: 65.9 ml/min (by C-G formula based on Cr of 0.6).   LIVER  Recent Labs Lab 07/31/13 1609 08/01/13 0319  AST 38* 29  ALT 20 16  ALKPHOS 84 71  BILITOT 0.2* <0.2*  PROT 6.6 5.5*  ALBUMIN 2.9* 2.4*     INFECTIOUS  Recent Labs Lab 07/31/13 1719 07/31/13 2249 07/31/13 2254 08/02/13 0548 08/04/13 0630  LATICACIDVEN 0.85 1.2  --   --   --   PROCALCITON  --   --  0.32 0.13 <0.10     ENDOCRINE CBG (last 3)  No results found for this basename: GLUCAP,  in the last 72 hours   IMAGING x48h  Dg Chest 2 View  08/04/2013   CLINICAL DATA:  Followup abnormalities in January 17th  EXAM: CHEST  2 VIEW  COMPARISON:  08/03/2013  FINDINGS: Stable mild cardiac enlargement. Stable vascular congestion. Mild right lower lobe atelectasis. Right lung otherwise clear. Thin linear density previously seen in the left lung base in the retrocardiac area is now seen as a more pyramidal triangular opacity of with its base on the diaphragmatic surface of the pleura. This is not seen well on the lateral view. There remains extensive lower lobe consolidation seen on the lateral view. There is a small left pleural effusion  IMPRESSION: Persistent abnormalities including stable cardiac enlargement and vascular congestion. There is retrocardiac left lower  lobe airspace disease. A triangular opacity at the left base likely represents subsegmental atelectasis. There is also fairly widespread alveolar opacification in the left lower lobe, with a small left effusion.   Electronically Signed   By: Skipper Cliche M.D.   On: 08/04/2013 13:28      ASSESSMENT / PLAN:  Influenza A PNA with possible bacterial superinfection. Acute asthma exacerbation. Acute hypoxic respiratory failure >> does not need home oxygen. Plan: -transition to prednisone 30 mg daily and wean off as tolerated over next one week -finish 10 day course of levaquin -f/u CXR as outpt -continue  singulair -prn albuterol   Okay to proceed with d/c home.  She is scheduled for pulmonary follow up with Tammy Parrett on Tuesday 08/13/13 at [place], MD [place] 08/06/2013, 9:55 AM Pager:  [phone] After 3pm call: [phone]

## 2013-08-06 NOTE — Progress Notes (Signed)
Occupational Therapy Treatment Patient Details Name: [name] MRN: [id number] DOB: 1945-09-11 Today's Date: 08/06/2013 Time: 0865-7846 OT Time Calculation (min): 42 min  OT Assessment / Plan / Recommendation  History of present illness 68 y.o. female presenting with 3 days of bad cough and fever 101.5 with fatigue and incr trouble ambulating. Pt at baseline ambulates with cane. Pt has hx of Esophageal reflux; Acute asthmatic bronchitis; Rheumatoid arthritis(714.0); and Allergic rhinitis. In ER noted to be hypoxic with oxygen 79 % requiring 4L O2 to maintain saturations   OT comments  Pt progressed to bathing at sink level this session. Pt required sitting during session to complete LB bathing/ dressing. Pt decr saturations on RA. Recommend oxygen for home adls. Pt will have assist of husband upon d/c home.    Follow Up Recommendations  Home health OT;Supervision - Intermittent    Barriers to Discharge       Equipment Recommendations  Other (comment) (oxygen based on desaturation this session)    Recommendations for Other Services    Frequency Min 2X/week   Progress towards OT Goals Progress towards OT goals: Progressing toward goals  Plan Discharge plan remains appropriate    Precautions / Restrictions Precautions Precautions: Fall Precaution Comments: 2 falls in past year; droplet    Pertinent Vitals/Pain 82% RA during adls    ADL  Eating/Feeding: Modified independent Where Assessed - Eating/Feeding: Chair Grooming: Wash/dry hands;Wash/dry face;Teeth care;Brushing hair;Supervision/safety Where Assessed - Grooming: Unsupported standing Upper Body Bathing: Chest;Right arm;Left arm;Abdomen;Supervision/safety Where Assessed - Upper Body Bathing: Unsupported standing Lower Body Bathing: Supervision/safety Where Assessed - Lower Body Bathing: Unsupported sit to stand (sitting to wash bil LE and feet, standing to wash peri area) Upper Body Dressing: Modified  independent Where Assessed - Upper Body Dressing: Unsupported sitting Lower Body Dressing: Modified independent Where Assessed - Lower Body Dressing: Unsupported sitting (don doff socks) Toilet Transfer: Copy Method: Sit to Loss adjuster, chartered: Regular height toilet;Grab bars Toileting - Clothing Manipulation and Hygiene: Supervision/safety Where Assessed - Best boy and Hygiene: Standing Equipment Used: Rolling walker Transfers/Ambulation Related to ADLs: Pt supine on arrival and required the use of HOB elevated with bed rail. pt progressed from EOb to standard toilet height. Pt sit<>Stand from no armed chair during grooming bathing at sink level.Pt transfered to recliner in room ADL Comments: pt on 2L o2 on arrival. Pt removed from o2 while therapist setup environment. pt oxygen saturation >90 % . pt with mobility and adls decr saturations to 82%. pt requires oxygen for ADLS and IADLS. (see addition progress note) . Recommend oxygen for home based on this session. pt reports "this is the best I've felt since I've been here, I am glad I got up and completed that bath. Maybe they will let me go home today"         OT Goals(current goals can now be found in the care plan section) Acute Rehab OT Goals Patient Stated Goal: to get back to playing golf- the new medication for  Rheumatoid arthritis really helped a lot OT Goal Formulation: With patient Time For Goal Achievement: 08/19/13 Potential to Achieve Goals: Good ADL Goals Pt Will Perform Grooming: with modified independence;sitting;standing Pt Will Perform Upper Body Bathing: with modified independence;sitting;standing Pt Will Perform Lower Body Bathing: with modified independence;sit to/from stand Pt Will Perform Upper Body Dressing: with modified independence;sitting;standing Pt Will Perform Lower Body Dressing: with modified independence;sit to/from stand Pt Will Transfer  to Toilet: with modified independence;bedside commode;ambulating  Visit Information  Last OT Received On: 08/06/13 Assistance Needed: +1 History of Present Illness: 68 y.o. female presenting with 3 days of bad cough and fever 101.5 with fatigue and incr trouble ambulating. Pt at baseline ambulates with cane. Pt has hx of Esophageal reflux; Acute asthmatic bronchitis; Rheumatoid arthritis(714.0); and Allergic rhinitis. In ER noted to be hypoxic with oxygen 79 % requiring 4L O2 to maintain saturations    Subjective Data      Prior Functioning       Cognition  Cognition Arousal/Alertness: Awake/alert Behavior During Therapy: WFL for tasks assessed/performed Overall Cognitive Status: Within Functional Limits for tasks assessed    Mobility  Bed Mobility Overal bed mobility: Modified Independent General bed mobility comments: HOB ~20 degrees with bed rail Transfers Overall transfer level: Modified independent Equipment used: Rolling walker (2 wheeled) Transfers: Sit to/from Stand Sit to Stand: Modified independent (Device/Increase time) General transfer comment: pt requires use of BIL UE for sit<>Stand . pt pushing on RW, chair arms and sink surface during session             End of Session OT - End of Session Activity Tolerance: Patient tolerated treatment well Patient left: in chair;with call bell/phone within reach Nurse Communication: Mobility status;Precautions  GO     Peri Maris 08/06/2013, 9:36 AM Pager: [phone]

## 2013-08-06 NOTE — Progress Notes (Signed)
OT NOTE  Pulse oximetry on room air is  91 % supine Pt during static standing at sink with ADLs and grooming desaturation 82% on RA Pt rebounding to 95% on 2 L within  2 minutes.  Pt could benefit from oxygen for d/c home.  Jeri Modena   OTR/L Pager: [phone] Office: [phone] .

## 2013-08-06 NOTE — Progress Notes (Signed)
[name] discharged Home with husband per MD order.  Discharge instructions reviewed and discussed with the patient, all questions and concerns answered. Copy of instructions, care notes for new medication and diagnosis and scripts given to patient.    Medication List    STOP taking these medications       amoxicillin-clavulanate 875-125 MG per tablet  Commonly known as:  AUGMENTIN     chlorpheniramine-HYDROcodone 10-8 MG/5ML Lqcr  Commonly known as:  TUSSIONEX PENNKINETIC ER     oseltamivir 75 MG capsule  Commonly known as:  TAMIFLU     promethazine-codeine 6.25-10 MG/5ML syrup  Commonly known as:  PHENERGAN with CODEINE      TAKE these medications       albuterol 108 (90 BASE) MCG/ACT inhaler  Commonly known as:  PROAIR HFA  Inhale 2 puffs into the lungs every 6 (six) hours as needed for wheezing or shortness of breath.     albuterol (2.5 MG/3ML) 0.083% nebulizer solution  Commonly known as:  PROVENTIL  Take 3 mLs (2.5 mg total) by nebulization every 6 (six) hours as needed for wheezing or shortness of breath.     ALPRAZolam 1 MG tablet  Commonly known as:  XANAX  Take 1 mg by mouth at bedtime as needed for sleep.     aspirin EC 81 MG tablet  Take 81 mg by mouth daily.     benzonatate 100 MG capsule  Commonly known as:  TESSALON  Take 100 mg by mouth 4 (four) times daily as needed for cough.     BLUE-EMU MAXIMUM STRENGTH EX  Apply 1 application topically daily as needed (for hip, back pain).     clotrimazole 10 MG troche  Commonly known as:  MYCELEX  Take 10 mg by mouth daily.     colestipol 1 G tablet  Commonly known as:  COLESTID  Take 1.5 g by mouth daily. Take 1.5 tabs in am and 0.5 tabs at dinner     ergocalciferol 50000 UNITS capsule  Commonly known as:  VITAMIN D2  Take 50,000 Units by mouth every 14 (fourteen) days.     esomeprazole 40 MG capsule  Commonly known as:  NEXIUM  Take 40 mg by mouth every evening.     FLUoxetine 20 MG tablet   Commonly known as:  PROZAC  Take 20 mg by mouth daily.     fluticasone 50 MCG/ACT nasal spray  Commonly known as:  FLONASE  Place 2 sprays into the nose daily.     furosemide 20 MG tablet  Commonly known as:  LASIX  Take 40 mg by mouth 2 (two) times daily.     glycopyrrolate 1 MG tablet  Commonly known as:  ROBINUL  Take 1 mg by mouth 3 (three) times daily as needed (for IBS).     guaiFENesin 600 MG 12 hr tablet  Commonly known as:  MUCINEX  Take 2 tablets (1,200 mg total) by mouth 2 (two) times daily.     HYDROcodone-acetaminophen 10-325 MG per tablet  Commonly known as:  NORCO  Take 1 tablet by mouth every 6 (six) hours as needed for pain.     levofloxacin 750 MG tablet  Commonly known as:  LEVAQUIN  Take 1 tablet (750 mg total) by mouth daily. For 4 more days starting 1/21     mirtazapine 15 MG tablet  Commonly known as:  REMERON  Take 15 mg by mouth at bedtime.     montelukast 10 MG  tablet  Commonly known as:  SINGULAIR  Take 10 mg by mouth at bedtime.     potassium chloride SA 20 MEQ tablet  Commonly known as:  K-DUR,KLOR-CON  Take 20 mEq by mouth 3 (three) times daily.     pravastatin 40 MG tablet  Commonly known as:  PRAVACHOL  Take 40 mg by mouth daily.     predniSONE 10 MG tablet  Commonly known as:  DELTASONE  Take 3 tablets once daily for 4 days, then take 2 tablets once daily for 4 days, then take 1 tablet once daily for 4 days, then stop     traMADol 50 MG tablet  Commonly known as:  ULTRAM  Take 50 mg by mouth every 8 (eight) hours as needed for pain.        Patients skin is clean, dry and intact, no evidence of skin break down. IV site discontinued and catheter remains intact. Site without signs and symptoms of complications. Dressing and pressure applied.  Patient escorted to car by NT in a wheelchair,  no distress noted upon discharge.  Wynetta Emery, Dylon Correa C 08/06/2013 12:58 PM

## 2013-08-06 NOTE — Care Management Note (Addendum)
    Page 1 of 2   08/06/2013     12:52:25 PM   CARE MANAGEMENT NOTE 08/06/2013  Patient:  [name], [name]   Account Number:  [id number]  Date Initiated:  08/01/2013  Documentation initiated by:  [facility]  Subjective/Objective Assessment:   Admitted with pneumonia.  lives with spouse.  Pt has bsc, rolling walker, and a cane.     Action/Plan:   pt eval- recs hhpt   Anticipated DC Date:  08/06/2013   Anticipated DC Plan:  [place]  CM consult      [facility] Choice  HOME HEALTH   Choice offered to / List presented to:  C-3 Spouse   DME arranged  NEBULIZER MACHINE      DME agency  [place] arranged  HH-1 RN  [place].   Status of service:  Completed, signed off Medicare Important Message given?   (If response is "NO", the following Medicare IM given date fields will be blank) Date Medicare IM given:   Date Additional Medicare IM given:    Discharge Disposition:  [place]  Per UR Regulation:  Reviewed for med. necessity/level of care/duration of stay  If discussed at [place] of Stay Meetings, dates discussed:    Comments:  Contact[place]elyne, [place] [phone]  08/06/13 11:17 Tomi Bamberger RN, BSN [phone] patient lives with spouse, per physical therapy recs hhpt, ot, patient does not want hhot, she chose [facility] for hh services.  Referral made to [facility], Butch Penny notified. Soc will begin 24-48 hrs post dischare.  Patient has medication coverage and transportation at dc.  Patient states her MD told her she does not need home oxygen, RN will check saturations again because this am she was 82 on RA.  RN will let me know if patient needs home oxygen.  Per RN, patient does not need home oxygen, but will need nebulizer machine.  Justin with [facility] aware.

## 2013-08-06 NOTE — Discharge Summary (Signed)
[facility]  Physician Discharge Summary   [name] MRN: [id number] DOB/AGE: 11-10-1945 68 y.o.  Admit date: 07/31/2013 Discharge date: 08/06/2013  PCP: Leonard Downing, MD  DISCHARGE DIAGNOSES:  Active Problems:   ASTHMATIC BRONCHITIS, ACUTE   CAP (community acquired pneumonia)   Hypoxia   Influenza A with pneumonia   RECOMMENDATIONS FOR OUTPATIENT FOLLOW UP: 1. Close follow up next week at Pulmonology. 2. Home health to be arranged.  DISCHARGE CONDITION: fair  Diet recommendation: Low Sodium  Filed Weights   07/31/13 2300 07/31/13 2325 08/01/13 1532  Weight: 78 kg (171 lb 15.3 oz) 78.2 kg (172 lb 6.4 oz) 82 kg (180 lb 12.4 oz)    INITIAL HISTORY: [name] is a 68 y.o. female with a past medical history of Esophageal reflux; Acute asthmatic bronchitis; Rheumatoid arthritis(714.0); and Allergic rhinitis. She presented with 3 days hx of bad cough, fever up to 101.5, fatigue. She had been so weak that she had trouble ambulating. She normally walks with cane. She has hx. of asthma followed by Dr. Annamaria Boots. 2 days prior to admission she went to PCP who gave her prednisone 20 mg and cough suppressant. Then she went to see Dr. Annamaria Boots and was given prescription for Augmentin, Tamiflu. She was found to be febrile and had a CXR done that showed bilateral PNA at which point she was sent to ER. In ER patient was noted to be hypoxic down to 79 required up to 4 L of oxygen to maintain O2 saturation patient normally not on oxygen at home. Hospitalist was called for admission  Consultations:  Pulmonology  Procedures:  None  HOSPITAL COURSE:   Influenza and CAP (community acquired pneumonia) with Hypoxia  She was started on nebs and antibiotics. She was placed on oxygen. She was also started on Tamiflu. She has completed course of tamiflu and will need 5 more days of Levaquin. She was ambulated off oxygen and her sats stayed 89 or better. So she appears  to have improved and doesn't need home O2. Blood cultures negative so far. Sputum culture show few candida. She has done better in last 24 hours.  History of ASTHMATIC BRONCHITIS  As above. Nebs at home PRN.  IBS with diarrhea  Much improved. No loose stool currently. Continue Robinul. Probably due to combination of IBS and antibiotics. Since it is better no need for further evaluation.   SIRS  Resolved with treatment as above. Lactic acid and PCT is normal. BP normal. Cultures negative so far.   History of RA  Stable. Has been on steroid courses in past. Not on chronic daily steroids.   Chronic Back Pain  Improved. On Tramadol. To stay active at home. Seen by PT/OT. Home health will be arranged.  Overall she is better and is stable for discharge. Patient keen on going home. Seen by Dr. Halford Chessman and he agrees. Will have follow up at Pulmonology next week.   PERTINENT LABS:  The results of significant diagnostics from this hospitalization (including imaging, microbiology, ancillary and laboratory) are listed below for reference.    Microbiology: Recent Results (from the past 240 hour(s))  CULTURE, BLOOD (ROUTINE X 2)     Status: None   Collection Time    07/31/13  4:20 PM      Result Value Range Status   Specimen Description BLOOD RIGHT ARM   Final   Special Requests BOTTLES DRAWN AEROBIC ONLY [place]   Final   Culture  Setup Time  Final   Value: 07/31/2013 20:45     Performed at [facility]   Culture     Final   Value: NO GROWTH 5 DAYS     Performed at [facility]   Report Status 08/06/2013 FINAL   Final  CULTURE, BLOOD (ROUTINE X 2)     Status: None   Collection Time    07/31/13  4:25 PM      Result Value Range Status   Specimen Description BLOOD RIGHT HAND   Final   Special Requests BOTTLES DRAWN AEROBIC ONLY 5CC   Final   Culture  Setup Time     Final   Value: 07/31/2013 20:48     Performed at [facility]   Culture     Final   Value: NO GROWTH  5 DAYS     Performed at [facility]   Report Status 08/06/2013 FINAL   Final  URINE CULTURE     Status: None   Collection Time    07/31/13  5:33 PM      Result Value Range Status   Specimen Description URINE, CLEAN CATCH   Final   Special Requests NONE   Final   Culture  Setup Time     Final   Value: 07/31/2013 19:21     Performed at [facility]     Final   Value: NO GROWTH     Performed at [facility]   Culture     Final   Value: NO GROWTH     Performed at [facility]   Report Status 08/01/2013 FINAL   Final  MRSA PCR SCREENING     Status: None   Collection Time    07/31/13 11:29 PM      Result Value Range Status   MRSA by PCR NEGATIVE  NEGATIVE Final   Comment:            The GeneXpert MRSA Assay (FDA     approved for NASAL specimens     only), is one component of a     comprehensive MRSA colonization     surveillance program. It is not     intended to diagnose MRSA     infection nor to guide or     monitor treatment for     MRSA infections.  CULTURE, EXPECTORATED SPUTUM-ASSESSMENT     Status: None   Collection Time    08/01/13  1:34 AM      Result Value Range Status   Specimen Description SPUTUM   Final   Special Requests Normal   Final   Sputum evaluation     Final   Value: THIS SPECIMEN IS ACCEPTABLE. RESPIRATORY CULTURE REPORT TO FOLLOW.   Report Status 08/01/2013 FINAL   Final  CULTURE, RESPIRATORY (NON-EXPECTORATED)     Status: None   Collection Time    08/01/13  1:34 AM      Result Value Range Status   Specimen Description SPUTUM   Final   Special Requests NONE   Final   Gram Stain     Final   Value: FEW WBC PRESENT,BOTH PMN AND MONONUCLEAR     RARE SQUAMOUS EPITHELIAL CELLS PRESENT     RARE GRAM NEGATIVE RODS     Performed at [facility]   Culture     Final   Value: FEW CANDIDA ALBICANS     Performed at [facility]   Report  Status 08/03/2013 FINAL   Final  RESPIRATORY VIRUS PANEL      Status: Abnormal   Collection Time    08/01/13  2:00 AM      Result Value Range Status   Source - RVPAN NASOPHARYNGEAL   Final   Respiratory Syncytial Virus A NOT DETECTED   Final   Respiratory Syncytial Virus B NOT DETECTED   Final   Influenza A DETECTED (*)  Final   Influenza B NOT DETECTED   Final   Parainfluenza 1 NOT DETECTED   Final   Parainfluenza 2 NOT DETECTED   Final   Parainfluenza 3 NOT DETECTED   Final   Metapneumovirus NOT DETECTED   Final   Rhinovirus NOT DETECTED   Final   Adenovirus NOT DETECTED   Final   Influenza A H1 NOT DETECTED   Final   Influenza A H3 DETECTED (*)  Final   Comment: (NOTE)           Normal Reference Range for each Analyte: NOT DETECTED     Testing performed using the Luminex xTAG Respiratory Viral Panel test     kit.     This test was developed and its performance characteristics determined     by [facility]. It has not been cleared or approved by the [place]     Food and Drug Administration. This test is used for clinical purposes.     It should not be regarded as investigational or for research. This     laboratory is certified under the [place] (CLIA) as qualified to perform high complexity     clinical laboratory testing.     Performed at [facility]: Basic Metabolic Panel:  Recent Labs Lab 07/31/13 1609 08/01/13 0319 08/03/13 0548 08/04/13 0630 08/05/13 0530  NA 134* 139 147 144 143  K 4.3 4.3 3.5* 3.8 4.3  CL 103 108 114* 110 107  CO2 17* _0 GLUCOSE 110* 115* 75 84 144*  BUN 25* _1 CREATININE 0.85 0.72 0.71 0.66 0.60  CALCIUM 9.6 8.6 9.1 9.3 9.3   Liver Function Tests:  Recent Labs Lab 07/31/13 1609 08/01/13 0319  AST 38* 29  ALT 20 16  ALKPHOS 84 71  BILITOT 0.2* <0.2*  PROT 6.6 5.5*  ALBUMIN 2.9* 2.4*   CBC:  Recent Labs Lab 07/31/13 1609 08/01/13 0319 08/03/13 0548  WBC 10.8* 6.3 4.1  NEUTROABS 9.3* 5.0  --     HGB 12.8 10.7* 10.8*  HCT 38.9 33.6* 32.9*  MCV 90.3 91.8 90.1  PLT 275 229 247   BNP: BNP (last 3 results)  Recent Labs  07/31/13 1706  PROBNP 1242.0*   CBG:  Recent Labs Lab 08/01/13 0743 08/01/13 1219  GLUCAP 81 87     IMAGING STUDIES Dg Chest 2 View  08/04/2013   CLINICAL DATA:  Followup abnormalities in January 17th  EXAM: CHEST  2 VIEW  COMPARISON:  08/03/2013  FINDINGS: Stable mild cardiac enlargement. Stable vascular congestion. Mild right lower lobe atelectasis. Right lung otherwise clear. Thin linear density previously seen in the left lung base in the retrocardiac area is now seen as a more pyramidal triangular opacity of with its base on the diaphragmatic surface of the pleura. This is not seen well on the lateral view. There remains extensive lower lobe consolidation seen on the lateral view. There is a small  left pleural effusion  IMPRESSION: Persistent abnormalities including stable cardiac enlargement and vascular congestion. There is retrocardiac left lower lobe airspace disease. A triangular opacity at the left base likely represents subsegmental atelectasis. There is also fairly widespread alveolar opacification in the left lower lobe, with a small left effusion.   Electronically Signed   By: Skipper Cliche M.D.   On: 08/04/2013 13:28   Dg Chest 2 View  08/03/2013   CLINICAL DATA:  History of pneumonia  EXAM: CHEST  2 VIEW  COMPARISON:  PA and lateral chest film of August 02, 2013.  FINDINGS: The lungs are less well inflated today. The perihilar interstitial markings remain increased. Confluent density in the right upper lobe is less conspicuous. The left hemidiaphragm is further obscured today. There is partial obscuration of the right hemidiaphragm. The cardiac silhouette is mildly enlarged but stable. The central pulmonary vascularity is prominent. Vertically-oriented lucency in the left lower hemi thorax is of uncertain significance and may reflect the major  fissure bone and. This appears new. There is no definite evidence of a pneumothorax elsewhere.  IMPRESSION: The lungs are less well inflated today. There are persistently increased interstitial densities consistent with edema and/or pneumonia. Vertically-oriented density in the left lower hemi thorax is of uncertain significance. A repeat portable chest x-ray or preferably a PA and lateral chest x-ray would be useful to better evaluate the left lung base and to exclude a possible basilar pneumothorax.  These results will be called to the ordering clinician or representative by the Radiologist Assistant, and communication documented in the PACS Dashboard.   Electronically Signed   By: David  Martinique   On: 08/03/2013 10:48   Dg Chest 2 View  08/02/2013   CLINICAL DATA:  Pneumonia, airspace disease, followup  EXAM: CHEST  2 VIEW  COMPARISON:  07/31/2013  FINDINGS: Enlargement of cardiac silhouette.  Slight pulmonary vascular congestion.  Persistent focal opacity in the right upper lobe likely representing pneumonia.  Additional infiltrates are seen at the bases bilaterally.  Minimal atherosclerotic calcification aortic arch.  No gross pleural effusion or pneumothorax.  Bones demineralized.  IMPRESSION: Patchy bilateral infiltrates likely representing pneumonia.  Due to the focal nature of the right upper lobe opacity, followup exams until resolution recommended to exclude underlying mass/nodule.   Electronically Signed   By: Lavonia Dana M.D.   On: 08/02/2013 09:51   Dg Chest 2 View  07/31/2013   CLINICAL DATA:  68 year old female with cough shortness of Breath chest pain and fever. Initial encounter.  EXAM: CHEST  2 VIEW  COMPARISON:  09/10/2012 and earlier.  FINDINGS: Bilateral perihilar and confluent pulmonary opacity, maximal in the left lower lobe. Stable lung volumes. Stable cardiac size and mediastinal contours. Visualized tracheal air column is within normal limits. No pneumothorax. No pleural effusion  identified. No acute osseous abnormality identified. Partially visible lumbar fusion hardware.  IMPRESSION: Confluent pulmonary opacity, nonspecific but most compatible with bilateral pneumonia in this setting. Post treatment radiographs recommended to document resolution.   Electronically Signed   By: Lars Pinks M.D.   On: 07/31/2013 13:01    DISCHARGE EXAMINATION: Filed Vitals:   08/05/13 1650 08/05/13 2047 08/06/13 0539 08/06/13 0903  BP:  120/76 151/86   Pulse:  93 92   Temp:  99 F (37.2 C) 97.9 F (36.6 C)   TempSrc:  Oral Oral   Resp:  20 16   Height:      Weight:      SpO2: 89% 93%  95% 97%   General appearance: alert, cooperative, appears stated age, no distress and moderately obese Resp: crackles at bases. No wheezing. Cardio: regular rate and rhythm, S1, S2 normal, no murmur, click, rub or gallop  DISPOSITION: Home with [facility]  Discharge Orders   Future Appointments Provider Department Dept Phone   08/13/2013 11:15 AM Melvenia Needles, NP [facility] [phone]   08/30/2013 1:30 PM Deneise Lever, MD [facility] [phone]   11/11/2013 1:30 PM Deneise Lever, MD [facility] [phone]   Future Orders Complete By Expires   Diet - low sodium heart healthy  As directed       ALLERGIES:  Allergies  Allergen Reactions  . Lactose Intolerance (Gi) Diarrhea  . Other Diarrhea    "lettuce only"  . Tdap [Diphth-Acell Pertussis-Tetanus]     Shaking uncontrollably  . Erythromycin Other (See Comments)    Diarrhea   . Tetanus Toxoids Other (See Comments)    Shaking uncontrollable      Current Discharge Medication List    START taking these medications   Details  albuterol (PROVENTIL) (2.5 MG/3ML) 0.083% nebulizer solution Take 3 mLs (2.5 mg total) by nebulization every 6 (six) hours as needed for wheezing or shortness of breath. Qty: 75 mL, Refills: 1    guaiFENesin (MUCINEX) 600 MG 12 hr tablet Take 2 tablets (1,200 mg total) by  mouth 2 (two) times daily. Qty: 30 tablet, Refills: 0    levofloxacin (LEVAQUIN) 750 MG tablet Take 1 tablet (750 mg total) by mouth daily. For 4 more days starting 1/21 Qty: 4 tablet, Refills: 0      CONTINUE these medications which have CHANGED   Details  predniSONE (DELTASONE) 10 MG tablet Take 3 tablets once daily for 4 days, then take 2 tablets once daily for 4 days, then take 1 tablet once daily for 4 days, then stop Qty: 24 tablet, Refills: 0      CONTINUE these medications which have NOT CHANGED   Details  albuterol (PROAIR HFA) 108 (90 BASE) MCG/ACT inhaler Inhale 2 puffs into the lungs every 6 (six) hours as needed for wheezing or shortness of breath. Qty: 1 Inhaler, Refills: prn    ALPRAZolam (XANAX) 1 MG tablet Take 1 mg by mouth at bedtime as needed for sleep.    aspirin EC 81 MG tablet Take 81 mg by mouth daily.    benzonatate (TESSALON) 100 MG capsule Take 100 mg by mouth 4 (four) times daily as needed for cough.     clotrimazole (MYCELEX) 10 MG troche Take 10 mg by mouth daily.     colestipol (COLESTID) 1 G tablet Take 1.5 g by mouth daily. Take 1.5 tabs in am and 0.5 tabs at dinner    ergocalciferol (VITAMIN D2) 50000 UNITS capsule Take 50,000 Units by mouth every 14 (fourteen) days.      esomeprazole (NEXIUM) 40 MG capsule Take 40 mg by mouth every evening.    FLUoxetine (PROZAC) 20 MG tablet Take 20 mg by mouth daily.      fluticasone (FLONASE) 50 MCG/ACT nasal spray Place 2 sprays into the nose daily.     furosemide (LASIX) 20 MG tablet Take 40 mg by mouth 2 (two) times daily.     glycopyrrolate (ROBINUL) 1 MG tablet Take 1 mg by mouth 3 (three) times daily as needed (for IBS).     HYDROcodone-acetaminophen (NORCO) 10-325 MG per tablet Take 1 tablet by mouth every 6 (six) hours as needed  for pain.     Menthol, Topical Analgesic, (BLUE-EMU MAXIMUM STRENGTH EX) Apply 1 application topically daily as needed (for hip, back pain).    mirtazapine (REMERON) 15  MG tablet Take 15 mg by mouth at bedtime.    montelukast (SINGULAIR) 10 MG tablet Take 10 mg by mouth at bedtime.    potassium chloride SA (K-DUR,KLOR-CON) 20 MEQ tablet Take 20 mEq by mouth 3 (three) times daily.    pravastatin (PRAVACHOL) 40 MG tablet Take 40 mg by mouth daily.     traMADol (ULTRAM) 50 MG tablet Take 50 mg by mouth every 8 (eight) hours as needed for pain.      STOP taking these medications     amoxicillin-clavulanate (AUGMENTIN) 875-125 MG per tablet      promethazine-codeine (PHENERGAN WITH CODEINE) 6.25-10 MG/5ML syrup      chlorpheniramine-HYDROcodone (TUSSIONEX PENNKINETIC ER) 10-8 MG/5ML LQCR      oseltamivir (TAMIFLU) 75 MG capsule        Follow-up Information   Follow up with PARRETT,TAMMY, NP On 08/13/2013. (11:15 AM)    Specialty:  Nurse Practitioner   Contact information:   [address] [address] [phone]       Call Leonard Downing, MD. (As needed)    Specialty:  [facility]   Contact information:   [place] [address] [phone]       TOTAL DISCHARGE TIME: 17 mins  [facility] Pager [phone]  08/06/2013, 10:11 AM

## 2013-08-09 ENCOUNTER — Other Ambulatory Visit: Payer: Self-pay | Admitting: Internal Medicine

## 2013-08-10 DIAGNOSIS — M069 Rheumatoid arthritis, unspecified: Secondary | ICD-10-CM | POA: Diagnosis not present

## 2013-08-10 DIAGNOSIS — J111 Influenza due to unidentified influenza virus with other respiratory manifestations: Secondary | ICD-10-CM | POA: Diagnosis not present

## 2013-08-10 DIAGNOSIS — R6510 Systemic inflammatory response syndrome (SIRS) of non-infectious origin without acute organ dysfunction: Secondary | ICD-10-CM | POA: Diagnosis not present

## 2013-08-10 DIAGNOSIS — J209 Acute bronchitis, unspecified: Secondary | ICD-10-CM | POA: Diagnosis not present

## 2013-08-13 ENCOUNTER — Encounter: Payer: Self-pay | Admitting: Adult Health

## 2013-08-13 ENCOUNTER — Ambulatory Visit (INDEPENDENT_AMBULATORY_CARE_PROVIDER_SITE_OTHER): Payer: Medicare Other | Admitting: Adult Health

## 2013-08-13 ENCOUNTER — Ambulatory Visit (INDEPENDENT_AMBULATORY_CARE_PROVIDER_SITE_OTHER)
Admission: RE | Admit: 2013-08-13 | Discharge: 2013-08-13 | Disposition: A | Discharge status: Home / Self Care | Payer: Medicare Other | Source: Ambulatory Visit | Attending: Adult Health | Admitting: Adult Health

## 2013-08-13 VITALS — BP 124/70 | HR 91 | Temp 98.1°F | Ht 62.0 in | Wt 167.6 lb

## 2013-08-13 DIAGNOSIS — J189 Pneumonia, unspecified organism: Secondary | ICD-10-CM

## 2013-08-13 DIAGNOSIS — R059 Cough, unspecified: Secondary | ICD-10-CM | POA: Diagnosis not present

## 2013-08-13 DIAGNOSIS — R05 Cough: Secondary | ICD-10-CM | POA: Diagnosis not present

## 2013-08-13 NOTE — Patient Instructions (Signed)
Fluids and rest. Tylenol as needed. Advance activity as tolerated. follow up Dr. Annamaria Boots  In 4 weeks and As needed   Please contact office for sooner follow up if symptoms do not improve or worsen or seek emergency care

## 2013-08-13 NOTE — Progress Notes (Signed)
Subjective:    [name], female    DOB: Apr 27, 1946, 68 y.o.   MRN: [id number]  HPI 02/03/11- 4 yoF never smoker followed for allergic rhinitis, rhinosinusitis, asthmatic bronchitis, complicated by , GERD, rheumatoid arthritis. Last here August, 3, 2011 She has has done much better since last here- maybe because she is past the turmoil of moving last year. Not needing her nebulizer recently. Rescue inhaler used less than once/ week. Talked about the death of her aged mother.  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   02/06/12- 64 yoF followed for allergic rhinitis, rhinosinusitis, asthmatic bronchitis, complicated by , GERD, rheumatoid arthritis   Follows for: takes vaccine and has some sinus drainage when she eats.  She has responded a couple of x2 a Z-Pak when needed. Overall much better since she moved to her new home 2 years ago. Uses her rescue inhaler at bedtime. Less nighttime cough she lies on her side. 5 grandchildren stay with her, which is a stress and the source of colds. Watery rhinorrhea when she is.Troches have worked well for yeast prn.   03/28/2012 Acute OV  Complains of prod cough with w/ green/yellow/white mucus, wheezing, increased SOB, chills x2.5weeks, worse x1day.  OTC not helping .  Cough is keeping her up at night.  No hemoptysis or chest pian .  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  Never smoker.   09/20/12- 66 yoF followed for allergic rhinitis, rhinosinusitis, asthmatic bronchitis, complicated by , GERD/ Crohn's, rheumatoid arthritis  Husband here. FOLLOWS FOR: still having headaches, sinus drainage, and dizzy. Was given Augmentin and cough syrup last week. Had fallen in shower in August 2013-had a hematoma and no broken bones. Now- increased SOB/ DOE x 3 weeks. Dry cough at night. Chronic globus. Mucus plug from nose.  CXR 09/10/12-  IMPRESSION:  No acute findings.  Original Report Authenticated By: Lorin Picket, M.D.  11/08/12- 66 yoF followed for allergic rhinitis, rhinosinusitis, asthmatic bronchitis, complicated by , GERD/ Crohn's, rheumatoid arthritis  Husband here. FOLLOWS JQD:UKRCV on allergy vaccine1:10 GO and doing well; no reactions. Has had flare up with allergies; for about 3 weeks now. Never really cleared completely after exacerbation at last visit when we gave cefdinir.Marland Kitchen Has had increasing cough with scant yellow green sputum starting about a week ago. Denies fever or sore throat. Occasional wheeze. Increased ankle edema. Dr. Arelia Sneddon increased her Lasix. She had fallen on ice during the winter and badly skinned her right pretibial area and left knee. We talked about being seen at a wound center. She is to see a neurologist about her falls. She complains of always feeling very tired and husband says she snores. I educated them on watching for sleep apnea. CXR 09/10/12 IMPRESSION:  No acute findings.  Original Report Authenticated By: Lorin Picket, M.D.  06/11/13-   05/10/13- 67 yoF followed for allergic rhinitis, rhinosinusitis, asthmatic bronchitis, complicated by , GERD/ Crohn's, rheumatoid arthritis  Husband here. Allergy vaccine1:10 GO FOLLOWS FOR:  Sinus pressure w/ PND and cough w/ mucus green in color x2 months.  Also, having abdominal pain and frequent urination Had flu vaccine. Describes 2 months increased sinus drainage with some green mucus, frontal headaches. No fever, ear pressure or wheezing. Still recovering from abrasion to legs when she fell last winter. Has been going to wound Center.  05/10/13- 67 yoF followed for allergic rhinitis, rhinosinusitis, asthmatic bronchitis, complicated by , GERD/ Crohn's, rheumatoid arthritis  Husband here.  Allergy vaccine1:10 GO  FOLLOWS FOR: Sinus pressure w/ PND and cough w/ mucus green in color x2 months. Also, having abdominal pain and frequent urination  Had flu vaccine. Describes 2 months increased sinus drainage with some  green mucus, frontal headaches. No fever, ear pressure or wheezing.  Still recovering from abrasion to legs when she fell last winter. Has been going to wound Center.  06/11/13- 67 yoF followed for allergic rhinitis, rhinosinusitis, asthmatic bronchitis, complicated by , GERD/ Crohn's, rheumatoid arthritis   c/o sinus congestion x 1 wk. feels like fluid in head and ears,no ear pain,coughing at night-yellow and green,feels like fever today,nasal  congestion -clear This began with an upper respiratory infection but has become focused in her head She continues allergy vaccine 1: 10 GO  07/31/13 Acute OV  Complains of prod cough with green/yellow mucus, increased SOB, wheezing, fever, body aches.  began Augmentin and steroids on 1/13, fever began last night.  symptoms worse last night.  Patient says she was seen by her family doctor on January 12 and recommend to start on a prednisone taper. She was also called in Augmentin and Phenergan With Codeine cough syrup, which she began yesterday. She complains of a productive cough with thick, yellow green mucus. She's had fever that began last night. MAXIMUM TEMPERATURE 101.5. She also has body aches. She did have the flu shot. Patient's husband has had similar symptoms, but milder She denies any nausea, vomiting, diarrhea, abdominal pain, rash, recent travel Patient is eating, but has a decreased appetite. She says that Phenergan, codeine cough syrup is not helping. She is coughing nonstop. She denies any frank, hemoptysis, calf pain, orthopnea, or edema. Did notice a speck of blood. This morning, when she coughed very hard. >refer to ER for admit for bilateral PNA   08/13/2013 [facility] follow up  Returns for a post hospital follow up . Was admitted for bilateral PNA. Reports is much improved since discharge.  Finished abx 2 days ago, still taking pred taper..  CXR today shows near complete resolution of bilateral aspdz. She is feeling much better.   Still has some weakness.  No fever, chest pain or edema.  Appetite is returning. No n/v//d.      ROS-see HPI Constitutional:   No  weight loss, night sweats,  ++ Fevers, chills, fatigue, or  lassitude.  HEENT:   No headaches,  Difficulty swallowing,  Tooth/dental problems, or  Sore throat,                No sneezing, itching, ear ache,  +nasal congestion, post nasal drip,   CV:  No chest pain,  Orthopnea, PND, swelling in lower extremities, anasarca, dizziness, palpitations, syncope.   GI  No heartburn, indigestion, abdominal pain, nausea, vomiting, diarrhea, change in bowel habits, loss of appetite, bloody stools.   Resp:    No chest wall deformity  Skin: no rash or lesions.  GU: no dysuria, change in color of urine, no urgency or frequency.  No flank pain, no hematuria   MS:  No joint pain or swelling.  No decreased range of motion.  No back pain.  Psych:  No change in mood or affect. No depression or anxiety.  No memory loss.      Objective:  OBJ- Physical Exam GEN: A/Ox3; pleasant , NAD, elderly   HEENT:  [place]/AT,  EACs-clear, TMs-wnl, NOSE-clear, THROAT-clear, no lesions, no postnasal drip or exudate noted.   NECK:  Supple w/ fair ROM;  no JVD; normal carotid impulses w/o bruits; no thyromegaly or nodules palpated; no lymphadenopathy.  RESP  CTA  , no wheezing , no accessory muscle use, no dullness to percussion  CARD:  RRR, no m/r/g  , no peripheral edema, pulses intact, no cyanosis or clubbing.  GI:   Soft & nt; nml bowel sounds; no organomegaly or masses detected.  Musco: Warm bil, no deformities or joint swelling noted.   Neuro: alert, no focal deficits noted.    Skin: Warm, no lesions or rashes

## 2013-08-15 NOTE — Assessment & Plan Note (Signed)
Much improved clinically , cxr shows near complete resolution of PNA   Plan  uids and rest. Tylenol as needed. Advance activity as tolerated. follow up Dr. Annamaria Boots  In 4 weeks and As needed   Please contact office for sooner follow up if symptoms do not improve or worsen or seek emergency care

## 2013-08-19 ENCOUNTER — Telehealth: Payer: Self-pay | Admitting: Internal Medicine

## 2013-08-19 MED ORDER — FLUCONAZOLE 150 MG PO TABS: 150.0000 mg | ORAL_TABLET | Freq: Every day | ORAL | Status: DC

## 2013-08-19 NOTE — Telephone Encounter (Signed)
I called and spoke with [name]. She reports she has a vaginal yeast infection x 1 week from ABX giving to her for PNA. She is requesting to have diflucan called in for this.please advise Dr. Annamaria Boots thanks Last OV 08/13/13 Pending 08/30/13  Allergies  Allergen Reactions  . Lactose Intolerance (Gi) Diarrhea  . Other Diarrhea    "lettuce only"  . Tdap [Diphth-Acell Pertussis-Tetanus]     Shaking uncontrollably  . Erythromycin Other (See Comments)    Diarrhea   . Tetanus Toxoids Other (See Comments)    Shaking uncontrollable      Current Outpatient Prescriptions on File Prior to Visit  Medication Sig Dispense Refill  . albuterol (PROAIR HFA) 108 (90 BASE) MCG/ACT inhaler Inhale 2 puffs into the lungs every 6 (six) hours as needed for wheezing or shortness of breath.  1 Inhaler  prn  . albuterol (PROVENTIL) (2.5 MG/3ML) 0.083% nebulizer solution Take 3 mLs (2.5 mg total) by nebulization every 6 (six) hours as needed for wheezing or shortness of breath.  75 mL  1  . ALPRAZolam (XANAX) 1 MG tablet Take 1 mg by mouth at bedtime as needed for sleep.      Marland Kitchen aspirin EC 81 MG tablet Take 81 mg by mouth daily.      . benzonatate (TESSALON) 100 MG capsule Take 100 mg by mouth 4 (four) times daily as needed for cough.       . clotrimazole (MYCELEX) 10 MG troche Take 10 mg by mouth daily.       . colestipol (COLESTID) 1 G tablet Take 1.5 g by mouth daily. Take 1.5 tabs in am and 0.5 tabs at dinner      . ergocalciferol (VITAMIN D2) 50000 UNITS capsule Take 50,000 Units by mouth every 14 (fourteen) days.        Marland Kitchen esomeprazole (NEXIUM) 40 MG capsule Take 40 mg by mouth every evening.      Marland Kitchen FLUoxetine (PROZAC) 20 MG tablet Take 20 mg by mouth daily.        . fluticasone (FLONASE) 50 MCG/ACT nasal spray Place 2 sprays into the nose daily.       . furosemide (LASIX) 20 MG tablet Take 40 mg by mouth 2 (two) times daily.       Marland Kitchen glycopyrrolate (ROBINUL) 1 MG tablet Take 1 mg by mouth 3 (three) times daily as  needed (for IBS).       Marland Kitchen guaiFENesin (MUCINEX) 600 MG 12 hr tablet Take 2 tablets (1,200 mg total) by mouth 2 (two) times daily.  30 tablet  0  . HYDROcodone-acetaminophen (NORCO) 10-325 MG per tablet Take 1 tablet by mouth every 6 (six) hours as needed for pain.       Marland Kitchen ipratropium (ATROVENT) 0.03 % nasal spray Place 1-2 sprays into the nose at bedtime as needed.      . Menthol, Topical Analgesic, (BLUE-EMU MAXIMUM STRENGTH EX) Apply 1 application topically daily as needed (for hip, back pain).      . mirtazapine (REMERON) 15 MG tablet Take 15 mg by mouth at bedtime.      . montelukast (SINGULAIR) 10 MG tablet Take 10 mg by mouth at bedtime.      . potassium chloride SA (K-DUR,KLOR-CON) 20 MEQ tablet Take 20 mEq by mouth 3 (three) times daily.      . pravastatin (PRAVACHOL) 40 MG tablet Take 40 mg by mouth daily.       . predniSONE (DELTASONE) 10 MG tablet Take  3 tablets once daily for 4 days, then take 2 tablets once daily for 4 days, then take 1 tablet once daily for 4 days, then stop  24 tablet  0  . traMADol (ULTRAM) 50 MG tablet Take 50 mg by mouth every 8 (eight) hours as needed for pain.       No current facility-administered medications on file prior to visit.

## 2013-08-19 NOTE — Telephone Encounter (Signed)
Spoke with pt and is aware of recs. rx has been sent. Nothing further needed

## 2013-08-19 NOTE — Telephone Encounter (Signed)
Offer Diflucan 150 mg, # 5, 1 daily

## 2013-08-30 ENCOUNTER — Ambulatory Visit (INDEPENDENT_AMBULATORY_CARE_PROVIDER_SITE_OTHER): Payer: Medicare Other | Admitting: Internal Medicine

## 2013-08-30 ENCOUNTER — Encounter (INDEPENDENT_AMBULATORY_CARE_PROVIDER_SITE_OTHER): Payer: Self-pay

## 2013-08-30 ENCOUNTER — Encounter: Payer: Self-pay | Admitting: Internal Medicine

## 2013-08-30 VITALS — BP 150/84 | HR 96 | Ht 62.25 in | Wt 172.8 lb

## 2013-08-30 DIAGNOSIS — J40 Bronchitis, not specified as acute or chronic: Secondary | ICD-10-CM

## 2013-08-30 DIAGNOSIS — J328 Other chronic sinusitis: Secondary | ICD-10-CM

## 2013-08-30 MED ORDER — BENZONATATE 100 MG PO CAPS: 100.0000 mg | ORAL_CAPSULE | Freq: Four times a day (QID) | ORAL | Status: DC | PRN

## 2013-08-30 MED ORDER — HYDROCOD POLST-CHLORPHEN POLST 10-8 MG/5ML PO LQCR: ORAL | Status: DC

## 2013-08-30 MED ORDER — AMOXICILLIN 500 MG PO TABS: ORAL_TABLET | ORAL | Status: DC

## 2013-08-30 MED ORDER — FLUCONAZOLE 150 MG PO TABS: 150.0000 mg | ORAL_TABLET | Freq: Every day | ORAL | Status: DC

## 2013-08-30 NOTE — Patient Instructions (Addendum)
Script sent for amoxacillin and diflucan  Script printed for tussionex  Please call as needed

## 2013-08-30 NOTE — Progress Notes (Signed)
Subjective:    [name], female    DOB: Apr 27, 1946, 68 y.o.   MRN: [id number]  HPI 02/03/11- 4 yoF never smoker followed for allergic rhinitis, rhinosinusitis, asthmatic bronchitis, complicated by , GERD, rheumatoid arthritis. Last here August, 3, 2011 She has has done much better since last here- maybe because she is past the turmoil of moving last year. Not needing her nebulizer recently. Rescue inhaler used less than once/ week. Talked about the death of her aged mother.  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   02/06/12- 64 yoF followed for allergic rhinitis, rhinosinusitis, asthmatic bronchitis, complicated by , GERD, rheumatoid arthritis   Follows for: takes vaccine and has some sinus drainage when she eats.  She has responded a couple of x2 a Z-Pak when needed. Overall much better since she moved to her new home 2 years ago. Uses her rescue inhaler at bedtime. Less nighttime cough she lies on her side. 5 grandchildren stay with her, which is a stress and the source of colds. Watery rhinorrhea when she is.Troches have worked well for yeast prn.   03/28/2012 Acute OV  Complains of prod cough with w/ green/yellow/white mucus, wheezing, increased SOB, chills x2.5weeks, worse x1day.  OTC not helping .  Cough is keeping her up at night.  No hemoptysis or chest pian .  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  Never smoker.   09/20/12- 66 yoF followed for allergic rhinitis, rhinosinusitis, asthmatic bronchitis, complicated by , GERD/ Crohn's, rheumatoid arthritis  Husband here. FOLLOWS FOR: still having headaches, sinus drainage, and dizzy. Was given Augmentin and cough syrup last week. Had fallen in shower in August 2013-had a hematoma and no broken bones. Now- increased SOB/ DOE x 3 weeks. Dry cough at night. Chronic globus. Mucus plug from nose.  CXR 09/10/12-  IMPRESSION:  No acute findings.  Original Report Authenticated By: Lorin Picket, M.D.  11/08/12- 66 yoF followed for allergic rhinitis, rhinosinusitis, asthmatic bronchitis, complicated by , GERD/ Crohn's, rheumatoid arthritis  Husband here. FOLLOWS JQD:UKRCV on allergy vaccine1:10 GO and doing well; no reactions. Has had flare up with allergies; for about 3 weeks now. Never really cleared completely after exacerbation at last visit when we gave cefdinir.Marland Kitchen Has had increasing cough with scant yellow green sputum starting about a week ago. Denies fever or sore throat. Occasional wheeze. Increased ankle edema. Dr. Arelia Sneddon increased her Lasix. She had fallen on ice during the winter and badly skinned her right pretibial area and left knee. We talked about being seen at a wound center. She is to see a neurologist about her falls. She complains of always feeling very tired and husband says she snores. I educated them on watching for sleep apnea. CXR 09/10/12 IMPRESSION:  No acute findings.  Original Report Authenticated By: Lorin Picket, M.D.  06/11/13-   05/10/13- 67 yoF followed for allergic rhinitis, rhinosinusitis, asthmatic bronchitis, complicated by , GERD/ Crohn's, rheumatoid arthritis  Husband here. Allergy vaccine1:10 GO FOLLOWS FOR:  Sinus pressure w/ PND and cough w/ mucus green in color x2 months.  Also, having abdominal pain and frequent urination Had flu vaccine. Describes 2 months increased sinus drainage with some green mucus, frontal headaches. No fever, ear pressure or wheezing. Still recovering from abrasion to legs when she fell last winter. Has been going to wound Center.  05/10/13- 67 yoF followed for allergic rhinitis, rhinosinusitis, asthmatic bronchitis, complicated by , GERD/ Crohn's, rheumatoid arthritis  Husband here.  Allergy vaccine1:10 GO  FOLLOWS FOR: Sinus pressure w/ PND and cough w/ mucus green in color x2 months. Also, having abdominal pain and frequent urination  Had flu vaccine. Describes 2 months increased sinus drainage with some  green mucus, frontal headaches. No fever, ear pressure or wheezing.  Still recovering from abrasion to legs when she fell last winter. Has been going to wound Center.  06/11/13- 67 yoF followed for allergic rhinitis, rhinosinusitis, asthmatic bronchitis, complicated by , GERD/ Crohn's, rheumatoid arthritis   c/o sinus congestion x 1 wk. feels like fluid in head and ears,no ear pain,coughing at night-yellow and green,feels like fever today,nasal  congestion -clear This began with an upper respiratory infection but has become focused in her head She continues allergy vaccine 1: 10 GO  07/31/13 Acute OV  Complains of prod cough with green/yellow mucus, increased SOB, wheezing, fever, body aches.  began Augmentin and steroids on 1/13, fever began last night.  symptoms worse last night.  Patient says she was seen by her family doctor on January 12 and recommend to start on a prednisone taper. She was also called in Augmentin and Phenergan With Codeine cough syrup, which she began yesterday. She complains of a productive cough with thick, yellow green mucus. She's had fever that began last night. MAXIMUM TEMPERATURE 101.5. She also has body aches. She did have the flu shot. Patient's husband has had similar symptoms, but milder She denies any nausea, vomiting, diarrhea, abdominal pain, rash, recent travel Patient is eating, but has a decreased appetite. She says that Phenergan, codeine cough syrup is not helping. She is coughing nonstop. She denies any frank, hemoptysis, calf pain, orthopnea, or edema. Did notice a speck of blood. This morning, when she coughed very hard. >refer to ER for admit for bilateral PNA   08/13/2013 [facility] follow up  Returns for a post hospital follow up . Was admitted for bilateral PNA. Reports is much improved since discharge.  Finished abx 2 days ago, still taking pred taper..  CXR today shows near complete resolution of bilateral aspdz. She is feeling much better.   Still has some weakness.  No fever, chest pain or edema.  Appetite is returning. No n/v//d.   08/30/13- 67 yoF followed for allergic rhinitis, rhinosinusitis, asthmatic bronchitis, complicated by , GERD/ Crohn's, rheumatoid arthritis   Husband here FOLLOWS FOR:  Increased sob, wheezing and cough with green mucus x3 days Acute visit, for 5 days of new cold after resolving bronchitis exacerbation a few weeks ago. Exposed to grandchildren who were sick. Immunosuppressed by her rheumatoid arthritis and Crohn's disease. Increased dry cough, throat uncomfortable, low-grade fever, nasal congestion with yellow/green mucus CXR 08/13/13 IMPRESSION:  Near-complete resolution of left effusion and basilar airspace  disease. No new abnormality.  Electronically Signed  By: Inge Rise M.D.  On: 08/13/2013 11:21  ROS-see HPI Constitutional:   No-   weight loss, night sweats, +fevers, no- chills, fatigue, lassitude. HEENT:   No-  headaches, difficulty swallowing, tooth/dental problems, +sore throat,       No-  sneezing, itching, ear ache, +nasal congestion, +post nasal drip,  CV:  No-   chest pain, orthopnea, PND, swelling in lower extremities, anasarca,                                  dizziness, palpitations Resp: No-   shortness of breath with exertion or at rest.  No-   productive cough,  No non-productive cough,  No- coughing up of blood.              No-   change in color of mucus.  No- wheezing.   Skin: No-   rash or lesions. GI:  No-   heartburn, indigestion, abdominal pain, nausea, vomiting, GU:  MS:  No-   joint pain or swelling.   Neuro-     nothing unusual Psych:  No- change in mood or affect. No depression or anxiety.  No memory loss.   Objective:  OBJ- Physical Exam General- Alert, Oriented, Affect-appropriate, Distress- none acute Skin- rash-none, lesions- none, excoriation- none Lymphadenopathy- none Head- atraumatic            Eyes- Gross vision intact, PERRLA,  conjunctivae and secretions clear            Ears- Hearing, canals-normal            Nose- Clear, no-Septal dev, mucus, polyps, erosion, perforation             Throat- Mallampati II , mucosa +red , drainage- none, tonsils- atrophic Neck- flexible , trachea midline, no stridor , thyroid nl, carotid no bruit Chest - symmetrical excursion , unlabored           Heart/CV- RRR , no murmur , no gallop  , no rub, nl s1 s2                           - JVD- none , edema- none, stasis changes- none, varices- none           Lung- + diminished, wheeze- none, cough+ dry , dullness-none, rub- none           Chest wall-  Abd-  Br/ Gen/ Rectal- Not done, not indicated Extrem- cyanosis- none, clubbing, none, atrophy- none, strength- nl Neuro- grossly intact to observation

## 2013-08-31 NOTE — Assessment & Plan Note (Addendum)
Upper respiratory infection with acute recurrent bronchitis, probably viral Plan-amoxicillin (requests Diflucan in case of yeast), Tussionex. Supportive care

## 2013-08-31 NOTE — Assessment & Plan Note (Signed)
Increased rhinitis but I don't think she has a sinus infection yet

## 2013-09-04 ENCOUNTER — Telehealth: Payer: Self-pay | Admitting: Internal Medicine

## 2013-09-04 NOTE — Telephone Encounter (Signed)
Spoke with [name] at [facility]  He reports pt's sats this am in the mid 80's at rest on RA and pulse 120 \ Pt reported to him she has had low grade temp and increased cough  Spoke with the pt and scheduled appt with [facility] for 10 am tomorrow, ED sooner if needed

## 2013-09-05 ENCOUNTER — Ambulatory Visit (INDEPENDENT_AMBULATORY_CARE_PROVIDER_SITE_OTHER): Payer: Medicare Other | Admitting: Internal Medicine

## 2013-09-05 ENCOUNTER — Encounter: Payer: Self-pay | Admitting: Internal Medicine

## 2013-09-05 ENCOUNTER — Other Ambulatory Visit (INDEPENDENT_AMBULATORY_CARE_PROVIDER_SITE_OTHER): Payer: Medicare Other

## 2013-09-05 VITALS — BP 110/72 | HR 109 | Temp 98.4°F | Ht 62.25 in | Wt 172.8 lb

## 2013-09-05 DIAGNOSIS — J209 Acute bronchitis, unspecified: Secondary | ICD-10-CM | POA: Diagnosis not present

## 2013-09-05 DIAGNOSIS — J189 Pneumonia, unspecified organism: Secondary | ICD-10-CM | POA: Diagnosis not present

## 2013-09-05 LAB — CBC WITH DIFFERENTIAL/PLATELET
Basophils Absolute: 0 10*3/uL (ref 0.0–0.1)
Basophils Relative: 0.2 % (ref 0.0–3.0)
Eosinophils Absolute: 0.3 10*3/uL (ref 0.0–0.7)
Eosinophils Relative: 2.2 % (ref 0.0–5.0)
HCT: 42.7 % (ref 36.0–46.0)
Hemoglobin: 14.1 g/dL (ref 12.0–15.0)
Lymphocytes Relative: 14.4 % (ref 12.0–46.0)
Lymphs Abs: 2 10*3/uL (ref 0.7–4.0)
MCHC: 33.1 g/dL (ref 30.0–36.0)
MCV: 91.5 fl (ref 78.0–100.0)
Monocytes Absolute: 1 10*3/uL (ref 0.1–1.0)
Monocytes Relative: 7.4 % (ref 3.0–12.0)
Neutro Abs: 10.4 10*3/uL — ABNORMAL HIGH (ref 1.4–7.7)
Neutrophils Relative %: 75.8 % (ref 43.0–77.0)
Platelets: 376 10*3/uL (ref 150.0–400.0)
RBC: 4.67 Mil/uL (ref 3.87–5.11)
RDW: 15.1 % — ABNORMAL HIGH (ref 11.5–14.6)
WBC: 13.7 10*3/uL — ABNORMAL HIGH (ref 4.5–10.5)

## 2013-09-05 LAB — BASIC METABOLIC PANEL
BUN: 16 mg/dL (ref 6–23)
CO2: 27 mEq/L (ref 19–32)
Calcium: 10 mg/dL (ref 8.4–10.5)
Chloride: 103 mEq/L (ref 96–112)
Creatinine, Ser: 0.9 mg/dL (ref 0.4–1.2)
GFR: 62.68 mL/min (ref >60.00)
Glucose, Bld: 108 mg/dL — ABNORMAL HIGH (ref 70–99)
Potassium: 4.2 mEq/L (ref 3.5–5.1)
Sodium: 137 mEq/L (ref 135–145)

## 2013-09-05 MED ORDER — HYDROCOD POLST-CHLORPHEN POLST 10-8 MG/5ML PO LQCR: ORAL | Status: DC

## 2013-09-05 MED ORDER — ALBUTEROL SULFATE (2.5 MG/3ML) 0.083% IN NEBU: 2.5000 mg | INHALATION_SOLUTION | Freq: Four times a day (QID) | RESPIRATORY_TRACT | Status: DC | PRN

## 2013-09-05 NOTE — Patient Instructions (Signed)
Finish your antibiotic  Sample Breo ellipta   1 puff then rinse mouth, once daily  Order lab- CBC w2/ diff, BMET

## 2013-09-05 NOTE — Assessment & Plan Note (Signed)
Residual bronchitis after pneumonia Plan-check basic labs. Sample Breo Ellipta, fluids, avoid extreme cold

## 2013-09-05 NOTE — Progress Notes (Signed)
Subjective:    [name], female    DOB: 12-31-1945, 68 y.o.   MRN: [id number]  HPI 02/03/11- 30 yoF never smoker followed for allergic rhinitis, rhinosinusitis, asthmatic bronchitis, complicated by , GERD, rheumatoid arthritis. Last here August, 3, 2011 She has has done much better since last here- maybe because she is past the turmoil of moving last year. Not needing her nebulizer recently. Rescue inhaler used less than once/ week. Talked about the death of her aged mother.  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   02/06/12- 64 yoF followed for allergic rhinitis, rhinosinusitis, asthmatic bronchitis, complicated by , GERD, rheumatoid arthritis   Follows for: takes vaccine and has some sinus drainage when she eats.  She has responded a couple of x2 a Z-Pak when needed. Overall much better since she moved to her new home 2 years ago. Uses her rescue inhaler at bedtime. Less nighttime cough she lies on her side. 5 grandchildren stay with her, which is a stress and the source of colds. Watery rhinorrhea when she is.Troches have worked well for yeast prn.   03/28/2012 Acute OV  Complains of prod cough with w/ green/yellow/white mucus, wheezing, increased SOB, chills x2.5weeks, worse x1day.  OTC not helping .  Cough is keeping her up at night.  No hemoptysis or chest pian .  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  Never smoker.   09/20/12- 66 yoF followed for allergic rhinitis, rhinosinusitis, asthmatic bronchitis, complicated by , GERD/ Crohn's, rheumatoid arthritis  Husband here. FOLLOWS FOR: still having headaches, sinus drainage, and dizzy. Was given Augmentin and cough syrup last week. Had fallen in shower in August 2013-had a hematoma and no broken bones. Now- increased SOB/ DOE x 3 weeks. Dry cough at night. Chronic globus. Mucus plug from nose.  CXR 09/10/12-  IMPRESSION:  No acute findings.  Original Report Authenticated By: Lorin Picket, M.D.  11/08/12- 66 yoF followed for allergic rhinitis, rhinosinusitis, asthmatic bronchitis, complicated by , GERD/ Crohn's, rheumatoid arthritis  Husband here. FOLLOWS BVA:POLID on allergy vaccine1:10 GO and doing well; no reactions. Has had flare up with allergies; for about 3 weeks now. Never really cleared completely after exacerbation at last visit when we gave cefdinir.Marland Kitchen Has had increasing cough with scant yellow green sputum starting about a week ago. Denies fever or sore throat. Occasional wheeze. Increased ankle edema. Dr. Arelia Sneddon increased her Lasix. She had fallen on ice during the winter and badly skinned her right pretibial area and left knee. We talked about being seen at a wound center. She is to see a neurologist about her falls. She complains of always feeling very tired and husband says she snores. I educated them on watching for sleep apnea. CXR 09/10/12 IMPRESSION:  No acute findings.  Original Report Authenticated By: Lorin Picket, M.D.  06/11/13-   05/10/13- 67 yoF followed for allergic rhinitis, rhinosinusitis, asthmatic bronchitis, complicated by , GERD/ Crohn's, rheumatoid arthritis  Husband here. Allergy vaccine1:10 GO FOLLOWS FOR:  Sinus pressure w/ PND and cough w/ mucus green in color x2 months.  Also, having abdominal pain and frequent urination Had flu vaccine. Describes 2 months increased sinus drainage with some green mucus, frontal headaches. No fever, ear pressure or wheezing. Still recovering from abrasion to legs when she fell last winter. Has been going to wound Center.  05/10/13- 67 yoF followed for allergic rhinitis, rhinosinusitis, asthmatic bronchitis, complicated by , GERD/ Crohn's, rheumatoid arthritis  Husband here.  Allergy vaccine1:10 GO  FOLLOWS FOR: Sinus pressure w/ PND and cough w/ mucus green in color x2 months. Also, having abdominal pain and frequent urination  Had flu vaccine. Describes 2 months increased sinus drainage with some  green mucus, frontal headaches. No fever, ear pressure or wheezing.  Still recovering from abrasion to legs when she fell last winter. Has been going to wound Center.  06/11/13- 67 yoF followed for allergic rhinitis, rhinosinusitis, asthmatic bronchitis, complicated by , GERD/ Crohn's, rheumatoid arthritis   c/o sinus congestion x 1 wk. feels like fluid in head and ears,no ear pain,coughing at night-yellow and green,feels like fever today,nasal  congestion -clear This began with an upper respiratory infection but has become focused in her head She continues allergy vaccine 1: 10 GO  07/31/13 Acute OV  Complains of prod cough with green/yellow mucus, increased SOB, wheezing, fever, body aches.  began Augmentin and steroids on 1/13, fever began last night.  symptoms worse last night.  Patient says she was seen by her family doctor on January 12 and recommend to start on a prednisone taper. She was also called in Augmentin and Phenergan With Codeine cough syrup, which she began yesterday. She complains of a productive cough with thick, yellow green mucus. She's had fever that began last night. MAXIMUM TEMPERATURE 101.5. She also has body aches. She did have the flu shot. Patient's husband has had similar symptoms, but milder She denies any nausea, vomiting, diarrhea, abdominal pain, rash, recent travel Patient is eating, but has a decreased appetite. She says that Phenergan, codeine cough syrup is not helping. She is coughing nonstop. She denies any frank, hemoptysis, calf pain, orthopnea, or edema. Did notice a speck of blood. This morning, when she coughed very hard. >refer to ER for admit for bilateral PNA   08/13/2013 [facility] follow up  Returns for a post hospital follow up . Was admitted for bilateral PNA. Reports is much improved since discharge.  Finished abx 2 days ago, still taking pred taper..  CXR today shows near complete resolution of bilateral aspdz. She is feeling much better.   Still has some weakness.  No fever, chest pain or edema.  Appetite is returning. No n/v//d.   08/30/13- 67 yoF followed for allergic rhinitis, rhinosinusitis, asthmatic bronchitis, complicated by , GERD/ Crohn's, rheumatoid arthritis   Husband here FOLLOWS FOR:  Increased sob, wheezing and cough with green mucus x3 days Acute visit, for 5 days of new cold after resolving bronchitis exacerbation a few weeks ago. Exposed to grandchildren who were sick. Immunosuppressed by her rheumatoid arthritis and Crohn's disease. Increased dry cough, throat uncomfortable, low-grade fever, nasal congestion with yellow/green mucus CXR 08/13/13 IMPRESSION:  Near-complete resolution of left effusion and basilar airspace  disease. No new abnormality.  Electronically Signed  By: Inge Rise M.D.  On: 08/13/2013 11:21  09/05/13- 76 yoF followed for allergic rhinitis, rhinosinusitis, asthmatic bronchitis, complicated by , GERD/ Crohn's, rheumatoid arthritis   Husband here ACUTE  VISIT: patient having low O2 levels, SOB, and fatigued-just wants to sleep. Not eating well at all. She had come off of prednisone. Glucose was 144. She denies fever , purulent sputum, chest pain.  ROS-see HPI Constitutional:   No-   weight loss, night sweats, fevers, no- chills, +fatigue, lassitude. HEENT:   No-  headaches, difficulty swallowing, tooth/dental problems, sore throat,       No-  sneezing, itching, ear ache, nasal congestion, post nasal drip,  CV:  No-  chest pain, orthopnea, PND, swelling in lower extremities, anasarca,                                  dizziness, palpitations Resp: + shortness of breath with exertion or at rest.              No-   productive cough,  No non-productive cough,  No- coughing up of blood.              No-   change in color of mucus.  No- wheezing.   Skin: No-   rash or lesions. GI:  No-   heartburn, indigestion, abdominal pain, nausea, vomiting, GU:  MS:  No-   joint pain or swelling.    Neuro-     nothing unusual Psych:  No- change in mood or affect. No depression or anxiety.  No memory loss.   Objective:  OBJ- Physical Exam General- Alert, Oriented, Affect-appropriate, Distress- none acute Skin- rash-none, lesions- none, excoriation- none Lymphadenopathy- none Head- atraumatic            Eyes- Gross vision intact, PERRLA, conjunctivae and secretions clear            Ears- Hearing, canals-normal            Nose- Clear, no-Septal dev, mucus, polyps, erosion, perforation             Throat- Mallampati II , mucosa clear , drainage- none, tonsils- atrophic Neck- flexible , trachea midline, no stridor , thyroid nl, carotid no bruit Chest - symmetrical excursion , unlabored           Heart/CV- RRR , no murmur , no gallop  , no rub, nl s1 s2                           - JVD- none , edema- none, stasis changes- none, varices- none           Lung- + diminished, wheeze- none, cough+ parking , dullness-none, rub- none           Chest wall-  Abd-  Br/ Gen/ Rectal- Not done, not indicated Extrem- cyanosis- none, clubbing, none, atrophy- none, strength- nl Neuro- grossly intact to observation

## 2013-09-05 NOTE — Assessment & Plan Note (Signed)
Still weak and tired as she recovers from pneumonia. Residual bronchitis. Plan-CBC, chemistry panel to recheck glucose, sample Breo Ellipta

## 2013-09-11 ENCOUNTER — Telehealth: Payer: Self-pay | Admitting: Internal Medicine

## 2013-09-11 MED ORDER — LEVOFLOXACIN 750 MG PO TABS: 750.0000 mg | ORAL_TABLET | Freq: Every day | ORAL | Status: DC

## 2013-09-11 NOTE — Telephone Encounter (Signed)
Recently treated with amoxacillin.    Ok to call in levaquin 765m one a day for 5 days Take mucinex one am and pm until better.  If she has codiene cough syrup at home, that should be adequate for her.

## 2013-09-11 NOTE — Telephone Encounter (Signed)
Called and spoke with Ulis Rias. Aware of resc. Nothing further needed

## 2013-09-11 NOTE — Telephone Encounter (Signed)
Called, spoke with Ulis Rias - pt's nurse with [facility]. Pt was seen by CY on 09/05/13 with the following recs:  Patient Instructions     Finish your antibiotic  Sample Breo ellipta 1 puff then rinse mouth, once daily  Order lab- CBC w2/ diff, BMET   -----  Ulis Rias states pt's cough is worse and is prod with green phelgm.  Pt had PT today and had to rest frequently d/t SOB.  Vital signs stable today.  Lungs diminished.  No wheezing or chest tightness -- requesting recs and cough syrup.  States pt has hydrocodone cough syrup but is almost out and more of promethazine codiene syrup, but the hydrocodone works better.  As CY is off, will route msg to doc of the day.  Dr. Gwenette Greet, pls advise.  Thank you.

## 2013-09-13 ENCOUNTER — Other Ambulatory Visit: Payer: Self-pay | Admitting: Internal Medicine

## 2013-09-13 ENCOUNTER — Telehealth: Payer: Self-pay | Admitting: Internal Medicine

## 2013-09-13 MED ORDER — FLUCONAZOLE 150 MG PO TABS: 150.0000 mg | ORAL_TABLET | Freq: Every day | ORAL | Status: DC

## 2013-09-13 NOTE — Telephone Encounter (Signed)
Per CY-okay to give Diflucan 150 mg #3 take 1 po qd no refills. Thanks.

## 2013-09-13 NOTE — Telephone Encounter (Signed)
Pt aware of recs. rx sent

## 2013-09-13 NOTE — Telephone Encounter (Signed)
I called and spoke with pt. She is requesting diflucan. Pt was placed on levaquin 09/11/13. Now she has yeast infection. Please advise Dr. Annamaria Boots thanks  Allergies  Allergen Reactions  . Lactose Intolerance (Gi) Diarrhea  . Other Diarrhea    "lettuce only"  . Tdap [Diphth-Acell Pertussis-Tetanus]     Shaking uncontrollably  . Erythromycin Other (See Comments)    Diarrhea   . Tetanus Toxoids Other (See Comments)    Shaking uncontrollable

## 2013-09-18 ENCOUNTER — Telehealth: Payer: Self-pay | Admitting: Internal Medicine

## 2013-09-18 MED ORDER — FLUCONAZOLE 150 MG PO TABS: 150.0000 mg | ORAL_TABLET | Freq: Every day | ORAL | Status: DC

## 2013-09-18 MED ORDER — DOXYCYCLINE HYCLATE 100 MG PO TABS: 100.0000 mg | ORAL_TABLET | Freq: Two times a day (BID) | ORAL | Status: DC

## 2013-09-18 NOTE — Telephone Encounter (Signed)
Spoke with pt. States that she is not feeling any better. Still reports SOB and coughing. Mucus is green in color. Oxygen levels are staying in the upper 80's. Finished Levaquin yesterday. Would like to know how to proceed.  Allergies  Allergen Reactions  . Lactose Intolerance (Gi) Diarrhea  . Other Diarrhea    "lettuce only"  . Tdap [Diphth-Acell Pertussis-Tetanus]     Shaking uncontrollably  . Erythromycin Other (See Comments)    Diarrhea   . Tetanus Toxoids Other (See Comments)    Shaking uncontrollable     Current Outpatient Prescriptions on File Prior to Visit  Medication Sig Dispense Refill  . albuterol (PROAIR HFA) 108 (90 BASE) MCG/ACT inhaler Inhale 2 puffs into the lungs every 6 (six) hours as needed for wheezing or shortness of breath.  1 Inhaler  prn  . albuterol (PROVENTIL) (2.5 MG/3ML) 0.083% nebulizer solution Take 3 mLs (2.5 mg total) by nebulization every 6 (six) hours as needed for wheezing or shortness of breath.  75 mL  1  . ALPRAZolam (XANAX) 1 MG tablet Take 1 mg by mouth at bedtime as needed for sleep.      Marland Kitchen amoxicillin (AMOXIL) 500 MG tablet 1 by mouth, 3 times daily  21 tablet  0  . aspirin EC 81 MG tablet Take 81 mg by mouth daily.      . benzonatate (TESSALON) 100 MG capsule Take 1 capsule (100 mg total) by mouth 4 (four) times daily as needed for cough.  50 capsule  5  . chlorpheniramine-HYDROcodone (TUSSIONEX PENNKINETIC ER) 10-8 MG/5ML LQCR 5 ml by mouth every 12 hours if needed for cough  115 mL  0  . colestipol (COLESTID) 1 G tablet Take 1.5 g by mouth daily. Take 1.5 tabs in am and 0.5 tabs at dinner      . ergocalciferol (VITAMIN D2) 50000 UNITS capsule Take 50,000 Units by mouth every 14 (fourteen) days.        . fluconazole (DIFLUCAN) 150 MG tablet Take 1 tablet (150 mg total) by mouth daily.  3 tablet  0  . FLUoxetine (PROZAC) 20 MG tablet Take 20 mg by mouth daily.        . fluticasone (FLONASE) 50 MCG/ACT nasal spray Place 2 sprays into the  nose daily.       . furosemide (LASIX) 20 MG tablet Take 40 mg by mouth 2 (two) times daily.       Marland Kitchen glycopyrrolate (ROBINUL) 1 MG tablet Take 1 mg by mouth 3 (three) times daily as needed (for IBS).       Marland Kitchen guaiFENesin (MUCINEX) 600 MG 12 hr tablet Take 2 tablets (1,200 mg total) by mouth 2 (two) times daily.  30 tablet  0  . HYDROcodone-acetaminophen (NORCO) 10-325 MG per tablet Take 1 tablet by mouth every 6 (six) hours as needed for pain.       Marland Kitchen ipratropium (ATROVENT) 0.03 % nasal spray Place 1-2 sprays into the nose at bedtime as needed.      Marland Kitchen levofloxacin (LEVAQUIN) 750 MG tablet Take 1 tablet (750 mg total) by mouth daily.  7 tablet  0  . Menthol, Topical Analgesic, (BLUE-EMU MAXIMUM STRENGTH EX) Apply 1 application topically daily as needed (for hip, back pain).      . mirtazapine (REMERON) 15 MG tablet Take 15 mg by mouth at bedtime.      . montelukast (SINGULAIR) 10 MG tablet Take 10 mg by mouth at bedtime.      Marland Kitchen  omeprazole (PRILOSEC) 40 MG capsule Take 40 mg by mouth daily.      . potassium chloride SA (K-DUR,KLOR-CON) 20 MEQ tablet Take 20 mEq by mouth 3 (three) times daily.      . pravastatin (PRAVACHOL) 40 MG tablet Take 40 mg by mouth daily.       . traMADol (ULTRAM) 50 MG tablet Take 50 mg by mouth every 8 (eight) hours as needed for pain.       No current facility-administered medications on file prior to visit.    CY - please advise. Thanks.

## 2013-09-18 NOTE — Telephone Encounter (Signed)
Pt is aware of CY's recs. Rx has been sent. She requests that we also send in Diflucan. Per CY - this is fine to fill. This has been sent in. Nothing further is needed.

## 2013-09-18 NOTE — Telephone Encounter (Signed)
She had influenza A in January, but otherwise cultures were negative. She has had amoxacillin, then levaquin I'm not sure this is a bacterial infection, but we can try doxycycline 100 mg, # 14, 1 twice daily

## 2013-09-21 ENCOUNTER — Other Ambulatory Visit: Payer: Self-pay | Admitting: Internal Medicine

## 2013-09-30 ENCOUNTER — Telehealth: Payer: Self-pay | Admitting: Internal Medicine

## 2013-09-30 MED ORDER — DOXYCYCLINE HYCLATE 100 MG PO TABS: 100.0000 mg | ORAL_TABLET | Freq: Two times a day (BID) | ORAL | Status: DC

## 2013-09-30 MED ORDER — FLUCONAZOLE 150 MG PO TABS: 150.0000 mg | ORAL_TABLET | Freq: Every day | ORAL | Status: DC

## 2013-09-30 MED ORDER — HYDROCOD POLST-CHLORPHEN POLST 10-8 MG/5ML PO LQCR: ORAL | Status: DC

## 2013-09-30 NOTE — Telephone Encounter (Signed)
rx's refilled for pt. She is also asking for diflucan. Please advise thanks

## 2013-09-30 NOTE — Telephone Encounter (Signed)
Ok to refill her doxycycline and her Tussionex script

## 2013-09-30 NOTE — Telephone Encounter (Signed)
Spoke with [name]. Advised her that the pt has also called [place] about her cough. [name] just wanted to let us know that she will no longer be going out to see the pt, she is being discharged.

## 2013-09-30 NOTE — Telephone Encounter (Signed)
Ok to refill Diflucan 150 mg. # 4, 1 daily

## 2013-09-30 NOTE — Telephone Encounter (Signed)
Spoke with pt. She has finished the rx we gave her earlier this month for Doxy. When she finished her cough came back. Had an old rx of Doxy in her bathroom cabinet and has started taking it. Reports that her cough has improved. Wants to know if CY will give her another round of Doxy to have on hand and a possible cough med prescription.  Allergies  Allergen Reactions  . Lactose Intolerance (Gi) Diarrhea  . Other Diarrhea    "lettuce only"  . Tdap [Diphth-Acell Pertussis-Tetanus]     Shaking uncontrollably  . Erythromycin Other (See Comments)    Diarrhea   . Tetanus Toxoids Other (See Comments)    Shaking uncontrollable     Current Outpatient Prescriptions on File Prior to Visit  Medication Sig Dispense Refill  . albuterol (PROAIR HFA) 108 (90 BASE) MCG/ACT inhaler Inhale 2 puffs into the lungs every 6 (six) hours as needed for wheezing or shortness of breath.  1 Inhaler  prn  . albuterol (PROVENTIL) (2.5 MG/3ML) 0.083% nebulizer solution Take 3 mLs (2.5 mg total) by nebulization every 6 (six) hours as needed for wheezing or shortness of breath.  75 mL  1  . ALPRAZolam (XANAX) 1 MG tablet Take 1 mg by mouth at bedtime as needed for sleep.      Marland Kitchen amoxicillin (AMOXIL) 500 MG tablet 1 by mouth, 3 times daily  21 tablet  0  . aspirin EC 81 MG tablet Take 81 mg by mouth daily.      . benzonatate (TESSALON) 100 MG capsule Take 1 capsule (100 mg total) by mouth 4 (four) times daily as needed for cough.  50 capsule  5  . chlorpheniramine-HYDROcodone (TUSSIONEX PENNKINETIC ER) 10-8 MG/5ML LQCR 5 ml by mouth every 12 hours if needed for cough  115 mL  0  . colestipol (COLESTID) 1 G tablet Take 1.5 g by mouth daily. Take 1.5 tabs in am and 0.5 tabs at dinner      . doxycycline (VIBRA-TABS) 100 MG tablet Take 1 tablet (100 mg total) by mouth 2 (two) times daily.  14 tablet  0  . ergocalciferol (VITAMIN D2) 50000 UNITS capsule Take 50,000 Units by mouth every 14 (fourteen) days.        .  fluconazole (DIFLUCAN) 150 MG tablet Take 1 tablet (150 mg total) by mouth daily.  4 tablet  0  . FLUoxetine (PROZAC) 20 MG tablet Take 20 mg by mouth daily.        . fluticasone (FLONASE) 50 MCG/ACT nasal spray Place 2 sprays into the nose daily.       . furosemide (LASIX) 20 MG tablet Take 40 mg by mouth 2 (two) times daily.       Marland Kitchen glycopyrrolate (ROBINUL) 1 MG tablet Take 1 mg by mouth 3 (three) times daily as needed (for IBS).       Marland Kitchen guaiFENesin (MUCINEX) 600 MG 12 hr tablet Take 2 tablets (1,200 mg total) by mouth 2 (two) times daily.  30 tablet  0  . HYDROcodone-acetaminophen (NORCO) 10-325 MG per tablet Take 1 tablet by mouth every 6 (six) hours as needed for pain.       Marland Kitchen ipratropium (ATROVENT) 0.03 % nasal spray USE 1 TO 2 SPRAYS IN EACH NOSTRIL 3 TIMES DAILY AS NEEDED  30 mL  1  . levofloxacin (LEVAQUIN) 750 MG tablet Take 1 tablet (750 mg total) by mouth daily.  7 tablet  0  . Menthol, Topical Analgesic, (BLUE-EMU  MAXIMUM STRENGTH EX) Apply 1 application topically daily as needed (for hip, back pain).      . mirtazapine (REMERON) 15 MG tablet Take 15 mg by mouth at bedtime.      . montelukast (SINGULAIR) 10 MG tablet Take 10 mg by mouth at bedtime.      Marland Kitchen omeprazole (PRILOSEC) 40 MG capsule Take 40 mg by mouth daily.      . potassium chloride SA (K-DUR,KLOR-CON) 20 MEQ tablet Take 20 mEq by mouth 3 (three) times daily.      . pravastatin (PRAVACHOL) 40 MG tablet Take 40 mg by mouth daily.       . traMADol (ULTRAM) 50 MG tablet Take 50 mg by mouth every 8 (eight) hours as needed for pain.       No current facility-administered medications on file prior to visit.    CY - please advise. Thanks.

## 2013-09-30 NOTE — Telephone Encounter (Signed)
rx has been sent in. nothingf urther needed

## 2013-10-04 ENCOUNTER — Telehealth: Payer: Self-pay | Admitting: Internal Medicine

## 2013-10-04 NOTE — Telephone Encounter (Signed)
Called and made [facility] aware. Nothing further needed

## 2013-10-04 NOTE — Telephone Encounter (Signed)
That ought to come from the doctor who has been managing her leg wound, since that is the indication for her P.T.

## 2013-10-04 NOTE — Telephone Encounter (Signed)
Please advise Dr. Annamaria Boots if okay thanks

## 2013-10-09 DIAGNOSIS — M069 Rheumatoid arthritis, unspecified: Secondary | ICD-10-CM

## 2013-10-09 DIAGNOSIS — IMO0001 Reserved for inherently not codable concepts without codable children: Secondary | ICD-10-CM

## 2013-10-11 DIAGNOSIS — IMO0001 Reserved for inherently not codable concepts without codable children: Secondary | ICD-10-CM | POA: Diagnosis not present

## 2013-10-11 DIAGNOSIS — M069 Rheumatoid arthritis, unspecified: Secondary | ICD-10-CM | POA: Diagnosis not present

## 2013-10-14 ENCOUNTER — Ambulatory Visit (INDEPENDENT_AMBULATORY_CARE_PROVIDER_SITE_OTHER)
Admission: RE | Admit: 2013-10-14 | Discharge: 2013-10-14 | Disposition: A | Discharge status: Home / Self Care | Payer: Medicare Other | Source: Ambulatory Visit | Attending: Adult Health | Admitting: Adult Health

## 2013-10-14 ENCOUNTER — Telehealth: Payer: Self-pay | Admitting: Internal Medicine

## 2013-10-14 ENCOUNTER — Encounter: Payer: Self-pay | Admitting: Adult Health

## 2013-10-14 ENCOUNTER — Ambulatory Visit (INDEPENDENT_AMBULATORY_CARE_PROVIDER_SITE_OTHER): Payer: Medicare Other | Admitting: Adult Health

## 2013-10-14 VITALS — BP 118/64 | HR 103 | Temp 99.0°F | Ht 62.25 in | Wt 165.8 lb

## 2013-10-14 DIAGNOSIS — J189 Pneumonia, unspecified organism: Secondary | ICD-10-CM | POA: Diagnosis not present

## 2013-10-14 DIAGNOSIS — J984 Other disorders of lung: Secondary | ICD-10-CM | POA: Diagnosis not present

## 2013-10-14 DIAGNOSIS — M069 Rheumatoid arthritis, unspecified: Secondary | ICD-10-CM | POA: Diagnosis not present

## 2013-10-14 DIAGNOSIS — IMO0001 Reserved for inherently not codable concepts without codable children: Secondary | ICD-10-CM | POA: Diagnosis not present

## 2013-10-14 DIAGNOSIS — J45909 Unspecified asthma, uncomplicated: Secondary | ICD-10-CM | POA: Diagnosis not present

## 2013-10-14 DIAGNOSIS — J209 Acute bronchitis, unspecified: Secondary | ICD-10-CM | POA: Diagnosis not present

## 2013-10-14 DIAGNOSIS — J45998 Other asthma: Secondary | ICD-10-CM

## 2013-10-14 MED ORDER — PREDNISONE 10 MG PO TABS: ORAL_TABLET | ORAL | Status: DC

## 2013-10-14 NOTE — Telephone Encounter (Signed)
Called and spoke with pt. She reports back in Jan she had dbl PNA and the flu. She has not been well since. We have called in several RX's for pt and still no better. She wants to see CDY today. She has been having a lot of coughing-green phlem coming up. Pt wants appt. Please advise CDY thanks  Allergies  Allergen Reactions  . Lactose Intolerance (Gi) Diarrhea  . Other Diarrhea    "lettuce only"  . Tdap [Diphth-Acell Pertussis-Tetanus]     Shaking uncontrollably  . Erythromycin Other (See Comments)    Diarrhea   . Tetanus Toxoids Other (See Comments)    Shaking uncontrollable

## 2013-10-14 NOTE — Telephone Encounter (Signed)
Called and spoke with pt. appt scheduled with TP this afternoon

## 2013-10-14 NOTE — Patient Instructions (Signed)
Prednisone taper over next week Chest xray today .  Begin Oxygen 2l/m with activity and At bedtime   Follow up Dr. Annamaria Boots  In 3-4 weeks and As needed   Please contact office for sooner follow up if symptoms do not improve or worsen or seek emergency care

## 2013-10-14 NOTE — Telephone Encounter (Signed)
Per CY lets have patient come in to see TP today; per JJ okay to have patient come in at 2:45pm today. Thanks

## 2013-10-15 NOTE — Progress Notes (Signed)
Subjective:    [name], female    DOB: Apr 27, 1946, 68 y.o.   MRN: [id number]  HPI 02/03/11- 4 yoF never smoker followed for allergic rhinitis, rhinosinusitis, asthmatic bronchitis, complicated by , GERD, rheumatoid arthritis. Last here August, 3, 2011 She has has done much better since last here- maybe because she is past the turmoil of moving last year. Not needing her nebulizer recently. Rescue inhaler used less than once/ week. Talked about the death of her aged mother.  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   02/06/12- 64 yoF followed for allergic rhinitis, rhinosinusitis, asthmatic bronchitis, complicated by , GERD, rheumatoid arthritis   Follows for: takes vaccine and has some sinus drainage when she eats.  She has responded a couple of x2 a Z-Pak when needed. Overall much better since she moved to her new home 2 years ago. Uses her rescue inhaler at bedtime. Less nighttime cough she lies on her side. 5 grandchildren stay with her, which is a stress and the source of colds. Watery rhinorrhea when she is.Troches have worked well for yeast prn.   03/28/2012 Acute OV  Complains of prod cough with w/ green/yellow/white mucus, wheezing, increased SOB, chills x2.5weeks, worse x1day.  OTC not helping .  Cough is keeping her up at night.  No hemoptysis or chest pian .  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  Never smoker.   09/20/12- 66 yoF followed for allergic rhinitis, rhinosinusitis, asthmatic bronchitis, complicated by , GERD/ Crohn's, rheumatoid arthritis  Husband here. FOLLOWS FOR: still having headaches, sinus drainage, and dizzy. Was given Augmentin and cough syrup last week. Had fallen in shower in August 2013-had a hematoma and no broken bones. Now- increased SOB/ DOE x 3 weeks. Dry cough at night. Chronic globus. Mucus plug from nose.  CXR 09/10/12-  IMPRESSION:  No acute findings.  Original Report Authenticated By: Lorin Picket, M.D.  11/08/12- 66 yoF followed for allergic rhinitis, rhinosinusitis, asthmatic bronchitis, complicated by , GERD/ Crohn's, rheumatoid arthritis  Husband here. FOLLOWS JQD:UKRCV on allergy vaccine1:10 GO and doing well; no reactions. Has had flare up with allergies; for about 3 weeks now. Never really cleared completely after exacerbation at last visit when we gave cefdinir.Marland Kitchen Has had increasing cough with scant yellow green sputum starting about a week ago. Denies fever or sore throat. Occasional wheeze. Increased ankle edema. Dr. Arelia Sneddon increased her Lasix. She had fallen on ice during the winter and badly skinned her right pretibial area and left knee. We talked about being seen at a wound center. She is to see a neurologist about her falls. She complains of always feeling very tired and husband says she snores. I educated them on watching for sleep apnea. CXR 09/10/12 IMPRESSION:  No acute findings.  Original Report Authenticated By: Lorin Picket, M.D.  06/11/13-   05/10/13- 67 yoF followed for allergic rhinitis, rhinosinusitis, asthmatic bronchitis, complicated by , GERD/ Crohn's, rheumatoid arthritis  Husband here. Allergy vaccine1:10 GO FOLLOWS FOR:  Sinus pressure w/ PND and cough w/ mucus green in color x2 months.  Also, having abdominal pain and frequent urination Had flu vaccine. Describes 2 months increased sinus drainage with some green mucus, frontal headaches. No fever, ear pressure or wheezing. Still recovering from abrasion to legs when she fell last winter. Has been going to wound Center.  05/10/13- 67 yoF followed for allergic rhinitis, rhinosinusitis, asthmatic bronchitis, complicated by , GERD/ Crohn's, rheumatoid arthritis  Husband here.  Allergy vaccine1:10 GO  FOLLOWS FOR: Sinus pressure w/ PND and cough w/ mucus green in color x2 months. Also, having abdominal pain and frequent urination  Had flu vaccine. Describes 2 months increased sinus drainage with some  green mucus, frontal headaches. No fever, ear pressure or wheezing.  Still recovering from abrasion to legs when she fell last winter. Has been going to wound Center.  06/11/13- 67 yoF followed for allergic rhinitis, rhinosinusitis, asthmatic bronchitis, complicated by , GERD/ Crohn's, rheumatoid arthritis   c/o sinus congestion x 1 wk. feels like fluid in head and ears,no ear pain,coughing at night-yellow and green,feels like fever today,nasal  congestion -clear This began with an upper respiratory infection but has become focused in her head She continues allergy vaccine 1: 10 GO  07/31/13 Acute OV  Complains of prod cough with green/yellow mucus, increased SOB, wheezing, fever, body aches.  began Augmentin and steroids on 1/13, fever began last night.  symptoms worse last night.  Patient says she was seen by her family doctor on January 12 and recommend to start on a prednisone taper. She was also called in Augmentin and Phenergan With Codeine cough syrup, which she began yesterday. She complains of a productive cough with thick, yellow green mucus. She's had fever that began last night. MAXIMUM TEMPERATURE 101.5. She also has body aches. She did have the flu shot. Patient's husband has had similar symptoms, but milder She denies any nausea, vomiting, diarrhea, abdominal pain, rash, recent travel Patient is eating, but has a decreased appetite. She says that Phenergan, codeine cough syrup is not helping. She is coughing nonstop. She denies any frank, hemoptysis, calf pain, orthopnea, or edema. Did notice a speck of blood. This morning, when she coughed very hard. >refer to ER for admit for bilateral PNA   08/13/2013 [facility] follow up  Returns for a post hospital follow up . Was admitted for bilateral PNA. Reports is much improved since discharge.  Finished abx 2 days ago, still taking pred taper..  CXR today shows near complete resolution of bilateral aspdz. She is feeling much better.   Still has some weakness.  No fever, chest pain or edema.  Appetite is returning. No n/v//d.   08/30/13- 67 yoF followed for allergic rhinitis, rhinosinusitis, asthmatic bronchitis, complicated by , GERD/ Crohn's, rheumatoid arthritis   Husband here FOLLOWS FOR:  Increased sob, wheezing and cough with green mucus x3 days Acute visit, for 5 days of new cold after resolving bronchitis exacerbation a few weeks ago. Exposed to grandchildren who were sick. Immunosuppressed by her rheumatoid arthritis and Crohn's disease. Increased dry cough, throat uncomfortable, low-grade fever, nasal congestion with yellow/green mucus CXR 08/13/13 IMPRESSION:  Near-complete resolution of left effusion and basilar airspace  disease. No new abnormality.  Electronically Signed  By: Inge Rise M.D.  On: 08/13/2013 11:21  09/05/13- 49 yoF followed for allergic rhinitis, rhinosinusitis, asthmatic bronchitis, complicated by , GERD/ Crohn's, rheumatoid arthritis   Husband here ACUTE  VISIT: patient having low O2 levels, SOB, and fatigued-just wants to sleep. Not eating well at all. She had come off of prednisone. Glucose was 144. She denies fever , purulent sputum, chest pain. >>BREO rx   10/14/13 Acute OV  Complains of persistent symptoms.  Complains that she feels her breathing is getting worse. Wears out breath easily . Last ov with persistent desats, given BREO  . Did not feel this helped much at all.  Complains of prod cough with dark green mucus,  increased SOB, chest tightness x1 week.  Denies any f/c/s, nausea, vomiting, hemoptysis.  sats were 86% RA upon entering exam room.   Was called Doxycycline last week, finished on 3/27 Congestion is better but still having DOE .    ROS-see HPI Constitutional:   No  weight loss, night sweats,  Fevers, chills,  +fatigue, or  lassitude.  HEENT:   No headaches,  Difficulty swallowing,  Tooth/dental problems, or  Sore throat,                No sneezing, itching,  ear ache, nasal congestion, post nasal drip,   CV:  No chest pain,  Orthopnea, PND, swelling in lower extremities, anasarca, dizziness, palpitations, syncope.   GI  No heartburn, indigestion, abdominal pain, nausea, vomiting, diarrhea, change in bowel habits, loss of appetite, bloody stools.   Resp:  .  No chest wall deformity  Skin: no rash or lesions.  GU: no dysuria, change in color of urine, no urgency or frequency.  No flank pain, no hematuria   MS:  No joint pain or swelling.  No decreased range of motion.  No back pain.  Psych:  No change in mood or affect. No depression or anxiety.  No memory loss.       Objective:  OBJ- Physical Exam GEN: A/Ox3; pleasant , NAD, overweight   HEENT:  [place]/AT,  EACs-clear, TMs-wnl, NOSE-clear, THROAT-clear, no lesions, no postnasal drip or exudate noted.   NECK:  Supple w/ fair ROM; no JVD; normal carotid impulses w/o bruits; no thyromegaly or nodules palpated; no lymphadenopathy.  RESP  Diminshed BS ; w/o, wheezes/ rales/ or rhonchi.no accessory muscle use, no dullness to percussion  CARD:  RRR, no m/r/g  , tr  peripheral edema, pulses intact, no cyanosis or clubbing.  GI:   Soft & nt; nml bowel sounds; no organomegaly or masses detected.  Musco: Warm bil, no deformities or joint swelling noted.  Arthritic changes   Neuro: alert, no focal deficits noted.    Skin: Warm, no lesions or rashes

## 2013-10-15 NOTE — Assessment & Plan Note (Addendum)
Slow to resolve flare now w/ persistent exertional hypoxemia  Check CXR today  Consider PFTs in future    Plan  Prednisone taper over next week Chest xray today .  Begin Oxygen 2l/m with activity and At bedtime   Follow up Dr. Annamaria Boots  In 3-4 weeks and As needed   Please contact office for sooner follow up if symptoms do not improve or worsen or seek emergency care

## 2013-10-16 ENCOUNTER — Telehealth: Payer: Self-pay | Admitting: Internal Medicine

## 2013-10-16 DIAGNOSIS — M069 Rheumatoid arthritis, unspecified: Secondary | ICD-10-CM | POA: Diagnosis not present

## 2013-10-16 DIAGNOSIS — IMO0001 Reserved for inherently not codable concepts without codable children: Secondary | ICD-10-CM | POA: Diagnosis not present

## 2013-10-16 MED ORDER — HYDROCOD POLST-CHLORPHEN POLST 10-8 MG/5ML PO LQCR: ORAL | Status: DC

## 2013-10-16 NOTE — Telephone Encounter (Signed)
Closing encounter

## 2013-10-16 NOTE — Telephone Encounter (Signed)
Result Notes    Notes Recorded by Melvenia Needles, NP on 10/14/2013 at 5:02 PM No sign of PNA  Cont w/ ov recs Please contact office for sooner follow up if symptoms do not improve or worsen or seek emergency care '   Pt aware of results. Also, CY gave approval for Tussionex cough syrup; pt is aware that Rx has been placed at front-her husband will come by and pick this up. Nothing more needed.

## 2013-10-16 NOTE — Progress Notes (Signed)
Quick Note:  Called spoke with patient, advised of cxr results / recs as stated by TP. Pt verbalized her understanding and denied any questions. ______

## 2013-10-18 DIAGNOSIS — IMO0001 Reserved for inherently not codable concepts without codable children: Secondary | ICD-10-CM | POA: Diagnosis not present

## 2013-10-18 DIAGNOSIS — M069 Rheumatoid arthritis, unspecified: Secondary | ICD-10-CM | POA: Diagnosis not present

## 2013-10-21 DIAGNOSIS — IMO0001 Reserved for inherently not codable concepts without codable children: Secondary | ICD-10-CM | POA: Diagnosis not present

## 2013-10-21 DIAGNOSIS — M069 Rheumatoid arthritis, unspecified: Secondary | ICD-10-CM | POA: Diagnosis not present

## 2013-10-22 DIAGNOSIS — M069 Rheumatoid arthritis, unspecified: Secondary | ICD-10-CM | POA: Diagnosis not present

## 2013-10-22 DIAGNOSIS — IMO0001 Reserved for inherently not codable concepts without codable children: Secondary | ICD-10-CM | POA: Diagnosis not present

## 2013-10-25 DIAGNOSIS — IMO0001 Reserved for inherently not codable concepts without codable children: Secondary | ICD-10-CM | POA: Diagnosis not present

## 2013-10-25 DIAGNOSIS — M069 Rheumatoid arthritis, unspecified: Secondary | ICD-10-CM | POA: Diagnosis not present

## 2013-10-28 DIAGNOSIS — M069 Rheumatoid arthritis, unspecified: Secondary | ICD-10-CM | POA: Diagnosis not present

## 2013-10-28 DIAGNOSIS — IMO0001 Reserved for inherently not codable concepts without codable children: Secondary | ICD-10-CM | POA: Diagnosis not present

## 2013-10-30 DIAGNOSIS — IMO0001 Reserved for inherently not codable concepts without codable children: Secondary | ICD-10-CM | POA: Diagnosis not present

## 2013-10-30 DIAGNOSIS — M069 Rheumatoid arthritis, unspecified: Secondary | ICD-10-CM | POA: Diagnosis not present

## 2013-11-06 DIAGNOSIS — IMO0001 Reserved for inherently not codable concepts without codable children: Secondary | ICD-10-CM | POA: Diagnosis not present

## 2013-11-06 DIAGNOSIS — M069 Rheumatoid arthritis, unspecified: Secondary | ICD-10-CM | POA: Diagnosis not present

## 2013-11-11 ENCOUNTER — Ambulatory Visit (INDEPENDENT_AMBULATORY_CARE_PROVIDER_SITE_OTHER): Payer: Medicare Other | Admitting: Internal Medicine

## 2013-11-11 ENCOUNTER — Encounter: Payer: Self-pay | Admitting: Internal Medicine

## 2013-11-11 VITALS — BP 130/82 | HR 97 | Ht 62.5 in | Wt 171.4 lb

## 2013-11-11 DIAGNOSIS — J309 Allergic rhinitis, unspecified: Secondary | ICD-10-CM | POA: Diagnosis not present

## 2013-11-11 DIAGNOSIS — J45909 Unspecified asthma, uncomplicated: Secondary | ICD-10-CM

## 2013-11-11 DIAGNOSIS — J45998 Other asthma: Secondary | ICD-10-CM

## 2013-11-11 DIAGNOSIS — Z23 Encounter for immunization: Secondary | ICD-10-CM

## 2013-11-11 DIAGNOSIS — J302 Other seasonal allergic rhinitis: Secondary | ICD-10-CM

## 2013-11-11 DIAGNOSIS — J3089 Other allergic rhinitis: Secondary | ICD-10-CM

## 2013-11-11 MED ORDER — HYDROCOD POLST-CHLORPHEN POLST 10-8 MG/5ML PO LQCR: ORAL | Status: DC

## 2013-11-11 NOTE — Progress Notes (Signed)
Subjective:    [name], female    DOB: Apr 27, 1946, 68 y.o.   MRN: [id number]  HPI 02/03/11- 4 yoF never smoker followed for allergic rhinitis, rhinosinusitis, asthmatic bronchitis, complicated by , GERD, rheumatoid arthritis. Last here August, 3, 2011 She has has done much better since last here- maybe because she is past the turmoil of moving last year. Not needing her nebulizer recently. Rescue inhaler used less than once/ week. Talked about the death of her aged mother.  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   02/06/12- 64 yoF followed for allergic rhinitis, rhinosinusitis, asthmatic bronchitis, complicated by , GERD, rheumatoid arthritis   Follows for: takes vaccine and has some sinus drainage when she eats.  She has responded a couple of x2 a Z-Pak when needed. Overall much better since she moved to her new home 2 years ago. Uses her rescue inhaler at bedtime. Less nighttime cough she lies on her side. 5 grandchildren stay with her, which is a stress and the source of colds. Watery rhinorrhea when she is.Troches have worked well for yeast prn.   03/28/2012 Acute OV  Complains of prod cough with w/ green/yellow/white mucus, wheezing, increased SOB, chills x2.5weeks, worse x1day.  OTC not helping .  Cough is keeping her up at night.  No hemoptysis or chest pian .  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  Never smoker.   09/20/12- 66 yoF followed for allergic rhinitis, rhinosinusitis, asthmatic bronchitis, complicated by , GERD/ Crohn's, rheumatoid arthritis  Husband here. FOLLOWS FOR: still having headaches, sinus drainage, and dizzy. Was given Augmentin and cough syrup last week. Had fallen in shower in August 2013-had a hematoma and no broken bones. Now- increased SOB/ DOE x 3 weeks. Dry cough at night. Chronic globus. Mucus plug from nose.  CXR 09/10/12-  IMPRESSION:  No acute findings.  Original Report Authenticated By: Lorin Picket, M.D.  11/08/12- 66 yoF followed for allergic rhinitis, rhinosinusitis, asthmatic bronchitis, complicated by , GERD/ Crohn's, rheumatoid arthritis  Husband here. FOLLOWS JQD:UKRCV on allergy vaccine1:10 GO and doing well; no reactions. Has had flare up with allergies; for about 3 weeks now. Never really cleared completely after exacerbation at last visit when we gave cefdinir.Marland Kitchen Has had increasing cough with scant yellow green sputum starting about a week ago. Denies fever or sore throat. Occasional wheeze. Increased ankle edema. Dr. Arelia Sneddon increased her Lasix. She had fallen on ice during the winter and badly skinned her right pretibial area and left knee. We talked about being seen at a wound center. She is to see a neurologist about her falls. She complains of always feeling very tired and husband says she snores. I educated them on watching for sleep apnea. CXR 09/10/12 IMPRESSION:  No acute findings.  Original Report Authenticated By: Lorin Picket, M.D.  06/11/13-   05/10/13- 67 yoF followed for allergic rhinitis, rhinosinusitis, asthmatic bronchitis, complicated by , GERD/ Crohn's, rheumatoid arthritis  Husband here. Allergy vaccine1:10 GO FOLLOWS FOR:  Sinus pressure w/ PND and cough w/ mucus green in color x2 months.  Also, having abdominal pain and frequent urination Had flu vaccine. Describes 2 months increased sinus drainage with some green mucus, frontal headaches. No fever, ear pressure or wheezing. Still recovering from abrasion to legs when she fell last winter. Has been going to wound Center.  05/10/13- 67 yoF followed for allergic rhinitis, rhinosinusitis, asthmatic bronchitis, complicated by , GERD/ Crohn's, rheumatoid arthritis  Husband here.  Allergy vaccine1:10 GO  FOLLOWS FOR: Sinus pressure w/ PND and cough w/ mucus green in color x2 months. Also, having abdominal pain and frequent urination  Had flu vaccine. Describes 2 months increased sinus drainage with some  green mucus, frontal headaches. No fever, ear pressure or wheezing.  Still recovering from abrasion to legs when she fell last winter. Has been going to wound Center.  06/11/13- 67 yoF followed for allergic rhinitis, rhinosinusitis, asthmatic bronchitis, complicated by , GERD/ Crohn's, rheumatoid arthritis   c/o sinus congestion x 1 wk. feels like fluid in head and ears,no ear pain,coughing at night-yellow and green,feels like fever today,nasal  congestion -clear This began with an upper respiratory infection but has become focused in her head She continues allergy vaccine 1: 10 GO  07/31/13 Acute OV  Complains of prod cough with green/yellow mucus, increased SOB, wheezing, fever, body aches.  began Augmentin and steroids on 1/13, fever began last night.  symptoms worse last night.  Patient says she was seen by her family doctor on January 12 and recommend to start on a prednisone taper. She was also called in Augmentin and Phenergan With Codeine cough syrup, which she began yesterday. She complains of a productive cough with thick, yellow green mucus. She's had fever that began last night. MAXIMUM TEMPERATURE 101.5. She also has body aches. She did have the flu shot. Patient's husband has had similar symptoms, but milder She denies any nausea, vomiting, diarrhea, abdominal pain, rash, recent travel Patient is eating, but has a decreased appetite. She says that Phenergan, codeine cough syrup is not helping. She is coughing nonstop. She denies any frank, hemoptysis, calf pain, orthopnea, or edema. Did notice a speck of blood. This morning, when she coughed very hard. >refer to ER for admit for bilateral PNA   08/13/2013 [facility] follow up  Returns for a post hospital follow up . Was admitted for bilateral PNA. Reports is much improved since discharge.  Finished abx 2 days ago, still taking pred taper..  CXR today shows near complete resolution of bilateral aspdz. She is feeling much better.   Still has some weakness.  No fever, chest pain or edema.  Appetite is returning. No n/v//d.   08/30/13- 67 yoF followed for allergic rhinitis, rhinosinusitis, asthmatic bronchitis, complicated by , GERD/ Crohn's, rheumatoid arthritis   Husband here FOLLOWS FOR:  Increased sob, wheezing and cough with green mucus x3 days Acute visit, for 5 days of new cold after resolving bronchitis exacerbation a few weeks ago. Exposed to grandchildren who were sick. Immunosuppressed by her rheumatoid arthritis and Crohn's disease. Increased dry cough, throat uncomfortable, low-grade fever, nasal congestion with yellow/green mucus CXR 08/13/13 IMPRESSION:  Near-complete resolution of left effusion and basilar airspace  disease. No new abnormality.  Electronically Signed  By: Inge Rise M.D.  On: 08/13/2013 11:21  09/05/13- 76 yoF followed for allergic rhinitis, rhinosinusitis, asthmatic bronchitis, complicated by  GERD/ Crohn's, rheumatoid arthritis   Husband here ACUTE  VISIT: patient having low O2 levels, SOB, and fatigued-just wants to sleep. Not eating well at all. She had come off of prednisone. Glucose was 144. She denies fever , purulent sputum, chest pain. >>BREO rx   10/14/13 Acute OV - TP Complains of persistent symptoms.  Complains that she feels her breathing is getting worse. Wears out breath easily . Last ov with persistent desats, given BREO  . Did not feel this helped much at all.  Complains of prod cough with dark green  mucus, increased SOB, chest tightness x1 week.  Denies any f/c/s, nausea, vomiting, hemoptysis.  sats were 86% RA upon entering exam room.   Was called Doxycycline last week, finished on 3/27 Congestion is better but still having DOE . Slow to resolve flare now w/ persistent exertional hypoxemia  Check CXR today  Consider PFTs in future  Plan  Prednisone taper over next week  Chest xray today .  Begin Oxygen 2l/m with activity and At bedtime  Follow up Dr. Annamaria Boots  In 3-4 weeks and As needed  Please contact office for sooner follow up if symptoms do not improve or worsen or seek emergency care   11/12/13- 67 yoF followed for allergic rhinitis, rhinosinusitis, asthmatic bronchitis, complicated by  GERD/ Crohn's, rheumatoid arthritis   Husband here FOLLOWS FOR:  Cough has improved.  Now on 2 - 3 L O2 / [employer] sleep/ exertion. Needed oxygen after last visit, used for sleep and as needed. Green sputum has now cleared. Treating postnasal drip with Claritin and Mucinex DM. Continues allergy vaccine 1:10 GO CXR 10/14/13 IMPRESSION:  Stable chronic lung disease with bibasilar scarring/atelectasis.  Electronically Signed  By: Aletta Edouard M.D.  On: 10/14/2013 15:50  ROS-see HPI Constitutional:   No-   weight loss, night sweats, fevers, chills, fatigue, lassitude. HEENT:   No-  headaches, difficulty swallowing, tooth/dental problems, sore throat,       No-  sneezing, itching, ear ache, nasal congestion, post nasal drip,  CV:  No-   chest pain, orthopnea, PND, swelling in lower extremities, anasarca,                                  dizziness, palpitations Resp: No-   shortness of breath with exertion or at rest.              +  productive cough,  No non-productive cough,  No- coughing up of blood.              No-   change in color of mucus.  No- wheezing.   Skin: No-   rash or lesions. GI:  No-   heartburn, indigestion, abdominal pain, nausea, vomiting,  GU:  MS:  No-   joint pain or swelling.   Neuro-     nothing unusual Psych:  No- change in mood or affect. No depression or anxiety.  No memory loss.  Objective:  OBJ- Physical Exam On room air- sat 92% at rest General- Alert, Oriented, Affect-appropriate, Distress- none acute Skin- rash-none, lesions- none, excoriation- none Lymphadenopathy- none Head- atraumatic            Eyes- Gross vision intact, PERRLA, conjunctivae and secretions clear            Ears- Hearing, canals-normal             Nose- Clear, no-Septal dev, mucus, polyps, erosion, perforation             Throat- Mallampati II , mucosa clear , drainage- none, tonsils- atrophic Neck- flexible , trachea midline, no stridor , thyroid nl, carotid no bruit Chest - symmetrical excursion , unlabored           Heart/CV- RRR , no murmur , no gallop  , no rub, nl s1 s2                           - JVD-  none , edema- none, stasis changes- none, varices- none           Lung- clear to P&A, wheeze- none, cough- none , dullness-none, rub- none           Chest wall-  Abd-  Br/ Gen/ Rectal- Not done, not indicated Extrem- scar from treated wound Neuro- grossly intact to observation

## 2013-11-11 NOTE — Patient Instructions (Addendum)
Prevnar pneumonia vaccine-13  I encourage you to walk to build endurance. Look into the [employer] exercise program at the [employer] for Tussionex

## 2013-11-13 DIAGNOSIS — M069 Rheumatoid arthritis, unspecified: Secondary | ICD-10-CM | POA: Diagnosis not present

## 2013-11-13 DIAGNOSIS — IMO0001 Reserved for inherently not codable concepts without codable children: Secondary | ICD-10-CM | POA: Diagnosis not present

## 2013-11-20 DIAGNOSIS — IMO0001 Reserved for inherently not codable concepts without codable children: Secondary | ICD-10-CM | POA: Diagnosis not present

## 2013-11-20 DIAGNOSIS — M069 Rheumatoid arthritis, unspecified: Secondary | ICD-10-CM | POA: Diagnosis not present

## 2013-11-21 DIAGNOSIS — K591 Functional diarrhea: Secondary | ICD-10-CM | POA: Diagnosis not present

## 2013-11-21 DIAGNOSIS — K219 Gastro-esophageal reflux disease without esophagitis: Secondary | ICD-10-CM | POA: Diagnosis not present

## 2013-11-25 DIAGNOSIS — I10 Essential (primary) hypertension: Secondary | ICD-10-CM | POA: Diagnosis not present

## 2013-11-26 DIAGNOSIS — R300 Dysuria: Secondary | ICD-10-CM | POA: Diagnosis not present

## 2013-11-26 DIAGNOSIS — N189 Chronic kidney disease, unspecified: Secondary | ICD-10-CM | POA: Diagnosis not present

## 2013-11-26 DIAGNOSIS — I10 Essential (primary) hypertension: Secondary | ICD-10-CM | POA: Diagnosis not present

## 2013-11-26 DIAGNOSIS — R350 Frequency of micturition: Secondary | ICD-10-CM | POA: Diagnosis not present

## 2013-12-04 ENCOUNTER — Other Ambulatory Visit: Payer: Self-pay | Admitting: Internal Medicine

## 2013-12-07 ENCOUNTER — Encounter: Payer: Self-pay | Admitting: Internal Medicine

## 2013-12-07 NOTE — Assessment & Plan Note (Addendum)
Resolving exacerbation, getting back to baseline Plan-walk for endurance, refill Tussionex, consider pulmonary rehabilitation, pneumococcal vaccine discussion and questions answered

## 2013-12-07 NOTE — Assessment & Plan Note (Signed)
Control now with Claritin and continue allergy vaccine at 1:10 G0

## 2013-12-12 ENCOUNTER — Other Ambulatory Visit: Payer: Self-pay | Admitting: Internal Medicine

## 2014-01-10 ENCOUNTER — Telehealth: Payer: Self-pay | Admitting: Internal Medicine

## 2014-01-10 DIAGNOSIS — J45998 Other asthma: Secondary | ICD-10-CM

## 2014-01-10 NOTE — Telephone Encounter (Signed)
Pt aware and order placed. Nothing further needed 

## 2014-01-10 NOTE — Telephone Encounter (Signed)
Called spoke w/ pt. She is wanting to get rid of her O2. She reports she has not used it in 2-3 days, she has monitored her O2 sats and it is staying in the 90's w/ rest/activity. Please advise Dr. Annamaria Boots thanks

## 2014-01-10 NOTE — Telephone Encounter (Signed)
Ok to DC home O2

## 2014-02-03 ENCOUNTER — Telehealth: Payer: Self-pay | Admitting: Internal Medicine

## 2014-02-03 MED ORDER — FLUCONAZOLE 150 MG PO TABS: 150.0000 mg | ORAL_TABLET | Freq: Every day | ORAL | Status: DC | PRN

## 2014-02-03 MED ORDER — DOXYCYCLINE HYCLATE 100 MG PO TABS: ORAL_TABLET | ORAL | Status: DC

## 2014-02-03 MED ORDER — HYDROCOD POLST-CHLORPHEN POLST 10-8 MG/5ML PO LQCR: ORAL | Status: DC

## 2014-02-03 NOTE — Telephone Encounter (Signed)
Spoke with the pt and notified of recs per CDY  She verbalized understanding  Will have spouse pick up rx for tussionex tomorrow- placed up front

## 2014-02-03 NOTE — Telephone Encounter (Signed)
Spoke with patient-she states that she is having sore throat, drainage down the back of her throat, chest congestion, non productive cough, fever/chills and wheezing as well. Pt currently on Allegra D and wants to know what else CY suggests. If he give abx then she will need Diflucan rx as well. Pt wants Rx printed for Tussionex(pt can send her spouse to pick up tomorrow). Pt was offered appt with CY tomorrow at 1:30pm but states she is too busy tomorrow to come in for OV.  Pt last seen 11/11/13 Next OV: 03/10/2014 Pharmacy: [facility] [facility].   Allergies  Allergen Reactions  . Lactose Intolerance (Gi) Diarrhea  . Other Diarrhea    "lettuce only"  . Tdap [Diphth-Acell Pertussis-Tetanus]     Shaking uncontrollably  . Erythromycin Other (See Comments)    Diarrhea   . Tetanus Toxoids Other (See Comments)    Shaking uncontrollable

## 2014-02-03 NOTE — Telephone Encounter (Signed)
Offer doxycycline 100 mg, #8 2 today then one daily           Diflucan 150 mg, # 7, 1 daily while needed           Ok to refill tussionex

## 2014-02-26 DIAGNOSIS — M1610 Unilateral primary osteoarthritis, unspecified hip: Secondary | ICD-10-CM | POA: Diagnosis not present

## 2014-02-26 DIAGNOSIS — G4700 Insomnia, unspecified: Secondary | ICD-10-CM | POA: Diagnosis not present

## 2014-02-26 DIAGNOSIS — M67919 Unspecified disorder of synovium and tendon, unspecified shoulder: Secondary | ICD-10-CM | POA: Diagnosis not present

## 2014-02-26 DIAGNOSIS — M545 Low back pain, unspecified: Secondary | ICD-10-CM | POA: Diagnosis not present

## 2014-02-26 DIAGNOSIS — M719 Bursopathy, unspecified: Secondary | ICD-10-CM | POA: Diagnosis not present

## 2014-02-27 ENCOUNTER — Ambulatory Visit (INDEPENDENT_AMBULATORY_CARE_PROVIDER_SITE_OTHER): Payer: Medicare Other

## 2014-02-27 DIAGNOSIS — J309 Allergic rhinitis, unspecified: Secondary | ICD-10-CM | POA: Diagnosis not present

## 2014-03-10 ENCOUNTER — Encounter: Payer: Self-pay | Admitting: Internal Medicine

## 2014-03-10 ENCOUNTER — Ambulatory Visit (INDEPENDENT_AMBULATORY_CARE_PROVIDER_SITE_OTHER): Payer: Medicare Other | Admitting: Internal Medicine

## 2014-03-10 VITALS — BP 130/82 | HR 95 | Ht 61.0 in | Wt 181.4 lb

## 2014-03-10 DIAGNOSIS — J328 Other chronic sinusitis: Secondary | ICD-10-CM | POA: Diagnosis not present

## 2014-03-10 DIAGNOSIS — T17208A Unspecified foreign body in pharynx causing other injury, initial encounter: Secondary | ICD-10-CM | POA: Diagnosis not present

## 2014-03-10 DIAGNOSIS — J302 Other seasonal allergic rhinitis: Secondary | ICD-10-CM

## 2014-03-10 DIAGNOSIS — IMO0002 Reserved for concepts with insufficient information to code with codable children: Secondary | ICD-10-CM | POA: Diagnosis not present

## 2014-03-10 DIAGNOSIS — B370 Candidal stomatitis: Secondary | ICD-10-CM | POA: Diagnosis not present

## 2014-03-10 DIAGNOSIS — J309 Allergic rhinitis, unspecified: Secondary | ICD-10-CM | POA: Diagnosis not present

## 2014-03-10 DIAGNOSIS — J40 Bronchitis, not specified as acute or chronic: Secondary | ICD-10-CM

## 2014-03-10 DIAGNOSIS — J3089 Other allergic rhinitis: Secondary | ICD-10-CM

## 2014-03-10 NOTE — Assessment & Plan Note (Signed)
She continues allergy vaccine without problem and is confident that it helps her

## 2014-03-10 NOTE — Assessment & Plan Note (Signed)
She is describing a foreign body sensation behind her soft palate. I don't recognize anything on exam. Plan-refer to ENT anticipating rhino- laryngoscopy

## 2014-03-10 NOTE — Patient Instructions (Signed)
Order referral to [facility] ENT  Complaint of foreign body sensation behind soft palate

## 2014-03-10 NOTE — Assessment & Plan Note (Signed)
Chest is clear and lower respiratory complaints are well controlled at this visit

## 2014-03-10 NOTE — Progress Notes (Signed)
Subjective:    [name], female    DOB: Apr 27, 1946, 68 y.o.   MRN: [id number]  HPI 02/03/11- 4 yoF never smoker followed for allergic rhinitis, rhinosinusitis, asthmatic bronchitis, complicated by , GERD, rheumatoid arthritis. Last here August, 3, 2011 She has has done much better since last here- maybe because she is past the turmoil of moving last year. Not needing her nebulizer recently. Rescue inhaler used less than once/ week. Talked about the death of her aged mother.  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   02/06/12- 64 yoF followed for allergic rhinitis, rhinosinusitis, asthmatic bronchitis, complicated by , GERD, rheumatoid arthritis   Follows for: takes vaccine and has some sinus drainage when she eats.  She has responded a couple of x2 a Z-Pak when needed. Overall much better since she moved to her new home 2 years ago. Uses her rescue inhaler at bedtime. Less nighttime cough she lies on her side. 5 grandchildren stay with her, which is a stress and the source of colds. Watery rhinorrhea when she is.Troches have worked well for yeast prn.   03/28/2012 Acute OV  Complains of prod cough with w/ green/yellow/white mucus, wheezing, increased SOB, chills x2.5weeks, worse x1day.  OTC not helping .  Cough is keeping her up at night.  No hemoptysis or chest pian .  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  Never smoker.   09/20/12- 66 yoF followed for allergic rhinitis, rhinosinusitis, asthmatic bronchitis, complicated by , GERD/ Crohn's, rheumatoid arthritis  Husband here. FOLLOWS FOR: still having headaches, sinus drainage, and dizzy. Was given Augmentin and cough syrup last week. Had fallen in shower in August 2013-had a hematoma and no broken bones. Now- increased SOB/ DOE x 3 weeks. Dry cough at night. Chronic globus. Mucus plug from nose.  CXR 09/10/12-  IMPRESSION:  No acute findings.  Original Report Authenticated By: Lorin Picket, M.D.  11/08/12- 66 yoF followed for allergic rhinitis, rhinosinusitis, asthmatic bronchitis, complicated by , GERD/ Crohn's, rheumatoid arthritis  Husband here. FOLLOWS JQD:UKRCV on allergy vaccine1:10 GO and doing well; no reactions. Has had flare up with allergies; for about 3 weeks now. Never really cleared completely after exacerbation at last visit when we gave cefdinir.Marland Kitchen Has had increasing cough with scant yellow green sputum starting about a week ago. Denies fever or sore throat. Occasional wheeze. Increased ankle edema. Dr. Arelia Sneddon increased her Lasix. She had fallen on ice during the winter and badly skinned her right pretibial area and left knee. We talked about being seen at a wound center. She is to see a neurologist about her falls. She complains of always feeling very tired and husband says she snores. I educated them on watching for sleep apnea. CXR 09/10/12 IMPRESSION:  No acute findings.  Original Report Authenticated By: Lorin Picket, M.D.  06/11/13-   05/10/13- 67 yoF followed for allergic rhinitis, rhinosinusitis, asthmatic bronchitis, complicated by , GERD/ Crohn's, rheumatoid arthritis  Husband here. Allergy vaccine1:10 GO FOLLOWS FOR:  Sinus pressure w/ PND and cough w/ mucus green in color x2 months.  Also, having abdominal pain and frequent urination Had flu vaccine. Describes 2 months increased sinus drainage with some green mucus, frontal headaches. No fever, ear pressure or wheezing. Still recovering from abrasion to legs when she fell last winter. Has been going to wound Center.  05/10/13- 67 yoF followed for allergic rhinitis, rhinosinusitis, asthmatic bronchitis, complicated by , GERD/ Crohn's, rheumatoid arthritis  Husband here.  Allergy vaccine1:10 GO  FOLLOWS FOR: Sinus pressure w/ PND and cough w/ mucus green in color x2 months. Also, having abdominal pain and frequent urination  Had flu vaccine. Describes 2 months increased sinus drainage with some  green mucus, frontal headaches. No fever, ear pressure or wheezing.  Still recovering from abrasion to legs when she fell last winter. Has been going to wound Center.  06/11/13- 67 yoF followed for allergic rhinitis, rhinosinusitis, asthmatic bronchitis, complicated by , GERD/ Crohn's, rheumatoid arthritis   c/o sinus congestion x 1 wk. feels like fluid in head and ears,no ear pain,coughing at night-yellow and green,feels like fever today,nasal  congestion -clear This began with an upper respiratory infection but has become focused in her head She continues allergy vaccine 1: 10 GO  07/31/13 Acute OV  Complains of prod cough with green/yellow mucus, increased SOB, wheezing, fever, body aches.  began Augmentin and steroids on 1/13, fever began last night.  symptoms worse last night.  Patient says she was seen by her family doctor on January 12 and recommend to start on a prednisone taper. She was also called in Augmentin and Phenergan With Codeine cough syrup, which she began yesterday. She complains of a productive cough with thick, yellow green mucus. She's had fever that began last night. MAXIMUM TEMPERATURE 101.5. She also has body aches. She did have the flu shot. Patient's husband has had similar symptoms, but milder She denies any nausea, vomiting, diarrhea, abdominal pain, rash, recent travel Patient is eating, but has a decreased appetite. She says that Phenergan, codeine cough syrup is not helping. She is coughing nonstop. She denies any frank, hemoptysis, calf pain, orthopnea, or edema. Did notice a speck of blood. This morning, when she coughed very hard. >refer to ER for admit for bilateral PNA   08/13/2013 [facility] follow up  Returns for a post hospital follow up . Was admitted for bilateral PNA. Reports is much improved since discharge.  Finished abx 2 days ago, still taking pred taper..  CXR today shows near complete resolution of bilateral aspdz. She is feeling much better.   Still has some weakness.  No fever, chest pain or edema.  Appetite is returning. No n/v//d.   08/30/13- 67 yoF followed for allergic rhinitis, rhinosinusitis, asthmatic bronchitis, complicated by , GERD/ Crohn's, rheumatoid arthritis   Husband here FOLLOWS FOR:  Increased sob, wheezing and cough with green mucus x3 days Acute visit, for 5 days of new cold after resolving bronchitis exacerbation a few weeks ago. Exposed to grandchildren who were sick. Immunosuppressed by her rheumatoid arthritis and Crohn's disease. Increased dry cough, throat uncomfortable, low-grade fever, nasal congestion with yellow/green mucus CXR 08/13/13 IMPRESSION:  Near-complete resolution of left effusion and basilar airspace  disease. No new abnormality.  Electronically Signed  By: Inge Rise M.D.  On: 08/13/2013 11:21  09/05/13- 76 yoF followed for allergic rhinitis, rhinosinusitis, asthmatic bronchitis, complicated by  GERD/ Crohn's, rheumatoid arthritis   Husband here ACUTE  VISIT: patient having low O2 levels, SOB, and fatigued-just wants to sleep. Not eating well at all. She had come off of prednisone. Glucose was 144. She denies fever , purulent sputum, chest pain. >>BREO rx   10/14/13 Acute OV - TP Complains of persistent symptoms.  Complains that she feels her breathing is getting worse. Wears out breath easily . Last ov with persistent desats, given BREO  . Did not feel this helped much at all.  Complains of prod cough with dark green  mucus, increased SOB, chest tightness x1 week.  Denies any f/c/s, nausea, vomiting, hemoptysis.  sats were 86% RA upon entering exam room.   Was called Doxycycline last week, finished on 3/27 Congestion is better but still having DOE . Slow to resolve flare now w/ persistent exertional hypoxemia  Check CXR today  Consider PFTs in future  Plan  Prednisone taper over next week  Chest xray today .  Begin Oxygen 2l/m with activity and At bedtime  Follow up Dr. Annamaria Boots  In 3-4 weeks and As needed  Please contact office for sooner follow up if symptoms do not improve or worsen or seek emergency care   11/12/13- 67 yoF followed for allergic rhinitis, rhinosinusitis, asthmatic bronchitis, complicated by  GERD/ Crohn's, rheumatoid arthritis   Husband here FOLLOWS FOR:  Cough has improved.  Now on 2 - 3 L O2 / [employer] sleep/ exertion. Needed oxygen after last visit, used for sleep and as needed. Green sputum has now cleared. Treating postnasal drip with Claritin and Mucinex DM. Continues allergy vaccine 1:10 GO CXR 10/14/13 IMPRESSION:  Stable chronic lung disease with bibasilar scarring/atelectasis.  Electronically Signed  By: Aletta Edouard M.D.  On: 10/14/2013 15:50  03/10/14-67 yoF followed for allergic rhinitis, rhinosinusitis, asthmatic bronchitis, complicated by  GERD/ Crohn's, rheumatoid arthritis   Husband here 4 month follow up.  Still has prod cough with clear to yellow mucus in the mornings and qhs.  DOE is unchanged. She complains that since pneumonia in January, she has had a foreign body sensation of "something hanging back there" behind her soft palate.no headache and not blowing much from her nose. No sore throat. She describes how a chiropractor felt something against her forehead and she blew out a small nodular and reticular structure she compares to  "grapes and leaves", with no change in how her nose or throat felt. She has been comfortable since stopping oxygen once she recovered from the pneumonia.  ROS-see HPI Constitutional:   No-   weight loss, night sweats, fevers, chills, fatigue, lassitude. HEENT:   No-  headaches, difficulty swallowing, tooth/dental problems, sore throat,       No-  sneezing, itching, ear ache, nasal congestion, +post nasal drip,  CV:  No-   chest pain, orthopnea, PND, swelling in lower extremities, anasarca,                                  dizziness, palpitations Resp: No-   shortness of breath with exertion or at  rest.              +  productive cough,  No non-productive cough,  No- coughing up of blood.              No-   change in color of mucus.  No- wheezing.   Skin: No-   rash or lesions. GI:  No-   heartburn, indigestion, abdominal pain, nausea, vomiting,  GU:  MS:  No-   joint pain or swelling.   Neuro-     nothing unusual Psych:  No- change in mood or affect. No depression or anxiety.  No memory loss.  Objective:  OBJ- Physical Exam On room air- sat 92% at rest General- Alert, Oriented, Affect-appropriate, Distress- none acute Skin- rash-none, lesions- none, excoriation- none Lymphadenopathy- none Head- atraumatic            Eyes- Gross vision intact, PERRLA, conjunctivae and secretions clear  Ears- Hearing, canals-normal            Nose- Clear, no-Septal dev, mucus, polyps, erosion, perforation             Throat- Mallampati II , mucosa clear , drainage- none, tonsils- atrophic, voice normal Neck- flexible , trachea midline, no stridor , thyroid nl, carotid no bruit Chest - symmetrical excursion , unlabored           Heart/CV- RRR , no murmur , no gallop  , no rub, nl s1 s2                           - JVD- none , edema- none, stasis changes- none, varices- none           Lung- clear to P&A, wheeze- none, cough- none , dullness-none, rub- none           Chest wall-  Abd-  Br/ Gen/ Rectal- Not done, not indicated Extrem- scar from treated wound Neuro- grossly intact to observation

## 2014-03-10 NOTE — Assessment & Plan Note (Signed)
No thrush seen at this visit

## 2014-03-14 DIAGNOSIS — F458 Other somatoform disorders: Secondary | ICD-10-CM | POA: Diagnosis not present

## 2014-03-14 DIAGNOSIS — J301 Allergic rhinitis due to pollen: Secondary | ICD-10-CM | POA: Diagnosis not present

## 2014-03-14 DIAGNOSIS — K219 Gastro-esophageal reflux disease without esophagitis: Secondary | ICD-10-CM | POA: Diagnosis not present

## 2014-03-14 DIAGNOSIS — R059 Cough, unspecified: Secondary | ICD-10-CM | POA: Diagnosis not present

## 2014-03-14 DIAGNOSIS — R05 Cough: Secondary | ICD-10-CM | POA: Diagnosis not present

## 2014-03-27 ENCOUNTER — Encounter: Payer: Self-pay | Admitting: Adult Health

## 2014-03-27 ENCOUNTER — Ambulatory Visit (INDEPENDENT_AMBULATORY_CARE_PROVIDER_SITE_OTHER): Payer: Medicare Other | Admitting: Adult Health

## 2014-03-27 VITALS — BP 90/60 | HR 85 | Temp 98.7°F | Ht 61.0 in | Wt 182.6 lb

## 2014-03-27 DIAGNOSIS — J209 Acute bronchitis, unspecified: Secondary | ICD-10-CM | POA: Diagnosis not present

## 2014-03-27 MED ORDER — AMOXICILLIN-POT CLAVULANATE 875-125 MG PO TABS: 1.0000 | ORAL_TABLET | Freq: Two times a day (BID) | ORAL | Status: AC

## 2014-03-27 MED ORDER — FLUCONAZOLE 150 MG PO TABS: 150.0000 mg | ORAL_TABLET | Freq: Every day | ORAL | Status: DC

## 2014-03-27 MED ORDER — PREDNISONE 10 MG PO TABS: ORAL_TABLET | ORAL | Status: DC

## 2014-03-27 MED ORDER — ALBUTEROL SULFATE HFA 108 (90 BASE) MCG/ACT IN AERS: 2.0000 | INHALATION_SPRAY | RESPIRATORY_TRACT | Status: DC | PRN

## 2014-03-27 MED ORDER — HYDROCOD POLST-CHLORPHEN POLST 10-8 MG/5ML PO LQCR: ORAL | Status: DC

## 2014-03-27 NOTE — Assessment & Plan Note (Signed)
Flare-slow to resolve   Plan  Augmentin 859m Twice daily  For 7 days -take with food Prednisone taper over next week.  Mucinex DM Twice daily  As needed  Cough/congestion  Tussionex 1 tsp Twice daily  As needed  Cough, may make you sleepy.  Fluids and rest.  Take Prilosec 460mdaily in am before meal  Add Pepcid 2022mt bedtime   GERD diet  Saline nasal rinses As needed   Please contact office for sooner follow up if symptoms do not improve or worsen or seek emergency care  Follow up Dr. YouAnnamaria Bootsn 6 weeks and As needed

## 2014-03-27 NOTE — Progress Notes (Signed)
Subjective:    [name], female    DOB: Apr 27, 1946, 68 y.o.   MRN: [id number]  HPI 02/03/11- 4 yoF never smoker followed for allergic rhinitis, rhinosinusitis, asthmatic bronchitis, complicated by , GERD, rheumatoid arthritis. Last here August, 3, 2011 She has has done much better since last here- maybe because she is past the turmoil of moving last year. Not needing her nebulizer recently. Rescue inhaler used less than once/ week. Talked about the death of her aged mother.  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   02/06/12- 64 yoF followed for allergic rhinitis, rhinosinusitis, asthmatic bronchitis, complicated by , GERD, rheumatoid arthritis   Follows for: takes vaccine and has some sinus drainage when she eats.  She has responded a couple of x2 a Z-Pak when needed. Overall much better since she moved to her new home 2 years ago. Uses her rescue inhaler at bedtime. Less nighttime cough she lies on her side. 5 grandchildren stay with her, which is a stress and the source of colds. Watery rhinorrhea when she is.Troches have worked well for yeast prn.   03/28/2012 Acute OV  Complains of prod cough with w/ green/yellow/white mucus, wheezing, increased SOB, chills x2.5weeks, worse x1day.  OTC not helping .  Cough is keeping her up at night.  No hemoptysis or chest pian .  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  Never smoker.   09/20/12- 66 yoF followed for allergic rhinitis, rhinosinusitis, asthmatic bronchitis, complicated by , GERD/ Crohn's, rheumatoid arthritis  Husband here. FOLLOWS FOR: still having headaches, sinus drainage, and dizzy. Was given Augmentin and cough syrup last week. Had fallen in shower in August 2013-had a hematoma and no broken bones. Now- increased SOB/ DOE x 3 weeks. Dry cough at night. Chronic globus. Mucus plug from nose.  CXR 09/10/12-  IMPRESSION:  No acute findings.  Original Report Authenticated By: Lorin Picket, M.D.  11/08/12- 66 yoF followed for allergic rhinitis, rhinosinusitis, asthmatic bronchitis, complicated by , GERD/ Crohn's, rheumatoid arthritis  Husband here. FOLLOWS JQD:UKRCV on allergy vaccine1:10 GO and doing well; no reactions. Has had flare up with allergies; for about 3 weeks now. Never really cleared completely after exacerbation at last visit when we gave cefdinir.Marland Kitchen Has had increasing cough with scant yellow green sputum starting about a week ago. Denies fever or sore throat. Occasional wheeze. Increased ankle edema. Dr. Arelia Sneddon increased her Lasix. She had fallen on ice during the winter and badly skinned her right pretibial area and left knee. We talked about being seen at a wound center. She is to see a neurologist about her falls. She complains of always feeling very tired and husband says she snores. I educated them on watching for sleep apnea. CXR 09/10/12 IMPRESSION:  No acute findings.  Original Report Authenticated By: Lorin Picket, M.D.  06/11/13-   05/10/13- 67 yoF followed for allergic rhinitis, rhinosinusitis, asthmatic bronchitis, complicated by , GERD/ Crohn's, rheumatoid arthritis  Husband here. Allergy vaccine1:10 GO FOLLOWS FOR:  Sinus pressure w/ PND and cough w/ mucus green in color x2 months.  Also, having abdominal pain and frequent urination Had flu vaccine. Describes 2 months increased sinus drainage with some green mucus, frontal headaches. No fever, ear pressure or wheezing. Still recovering from abrasion to legs when she fell last winter. Has been going to wound Center.  05/10/13- 67 yoF followed for allergic rhinitis, rhinosinusitis, asthmatic bronchitis, complicated by , GERD/ Crohn's, rheumatoid arthritis  Husband here.  Allergy vaccine1:10 GO  FOLLOWS FOR: Sinus pressure w/ PND and cough w/ mucus green in color x2 months. Also, having abdominal pain and frequent urination  Had flu vaccine. Describes 2 months increased sinus drainage with some  green mucus, frontal headaches. No fever, ear pressure or wheezing.  Still recovering from abrasion to legs when she fell last winter. Has been going to wound Center.  06/11/13- 67 yoF followed for allergic rhinitis, rhinosinusitis, asthmatic bronchitis, complicated by , GERD/ Crohn's, rheumatoid arthritis   c/o sinus congestion x 1 wk. feels like fluid in head and ears,no ear pain,coughing at night-yellow and green,feels like fever today,nasal  congestion -clear This began with an upper respiratory infection but has become focused in her head She continues allergy vaccine 1: 10 GO  07/31/13 Acute OV  Complains of prod cough with green/yellow mucus, increased SOB, wheezing, fever, body aches.  began Augmentin and steroids on 1/13, fever began last night.  symptoms worse last night.  Patient says she was seen by her family doctor on January 12 and recommend to start on a prednisone taper. She was also called in Augmentin and Phenergan With Codeine cough syrup, which she began yesterday. She complains of a productive cough with thick, yellow green mucus. She's had fever that began last night. MAXIMUM TEMPERATURE 101.5. She also has body aches. She did have the flu shot. Patient's husband has had similar symptoms, but milder She denies any nausea, vomiting, diarrhea, abdominal pain, rash, recent travel Patient is eating, but has a decreased appetite. She says that Phenergan, codeine cough syrup is not helping. She is coughing nonstop. She denies any frank, hemoptysis, calf pain, orthopnea, or edema. Did notice a speck of blood. This morning, when she coughed very hard. >refer to ER for admit for bilateral PNA   08/13/2013 [facility] follow up  Returns for a post hospital follow up . Was admitted for bilateral PNA. Reports is much improved since discharge.  Finished abx 2 days ago, still taking pred taper..  CXR today shows near complete resolution of bilateral aspdz. She is feeling much better.   Still has some weakness.  No fever, chest pain or edema.  Appetite is returning. No n/v//d.   08/30/13- 67 yoF followed for allergic rhinitis, rhinosinusitis, asthmatic bronchitis, complicated by , GERD/ Crohn's, rheumatoid arthritis   Husband here FOLLOWS FOR:  Increased sob, wheezing and cough with green mucus x3 days Acute visit, for 5 days of new cold after resolving bronchitis exacerbation a few weeks ago. Exposed to grandchildren who were sick. Immunosuppressed by her rheumatoid arthritis and Crohn's disease. Increased dry cough, throat uncomfortable, low-grade fever, nasal congestion with yellow/green mucus CXR 08/13/13 IMPRESSION:  Near-complete resolution of left effusion and basilar airspace  disease. No new abnormality.  Electronically Signed  By: Inge Rise M.D.  On: 08/13/2013 11:21  09/05/13- 76 yoF followed for allergic rhinitis, rhinosinusitis, asthmatic bronchitis, complicated by  GERD/ Crohn's, rheumatoid arthritis   Husband here ACUTE  VISIT: patient having low O2 levels, SOB, and fatigued-just wants to sleep. Not eating well at all. She had come off of prednisone. Glucose was 144. She denies fever , purulent sputum, chest pain. >>BREO rx   10/14/13 Acute OV - TP Complains of persistent symptoms.  Complains that she feels her breathing is getting worse. Wears out breath easily . Last ov with persistent desats, given BREO  . Did not feel this helped much at all.  Complains of prod cough with dark green  mucus, increased SOB, chest tightness x1 week.  Denies any f/c/s, nausea, vomiting, hemoptysis.  sats were 86% RA upon entering exam room.   Was called Doxycycline last week, finished on 3/27 Congestion is better but still having DOE . Slow to resolve flare now w/ persistent exertional hypoxemia  Check CXR today  Consider PFTs in future  Plan  Prednisone taper over next week  Chest xray today .  Begin Oxygen 2l/m with activity and At bedtime  Follow up Dr. Annamaria Boots  In 3-4 weeks and As needed  Please contact office for sooner follow up if symptoms do not improve or worsen or seek emergency care   11/12/13- 67 yoF followed for allergic rhinitis, rhinosinusitis, asthmatic bronchitis, complicated by  GERD/ Crohn's, rheumatoid arthritis   Husband here FOLLOWS FOR:  Cough has improved.  Now on 2 - 3 L O2 / [employer] sleep/ exertion. Needed oxygen after last visit, used for sleep and as needed. Green sputum has now cleared. Treating postnasal drip with Claritin and Mucinex DM. Continues allergy vaccine 1:10 GO CXR 10/14/13 IMPRESSION:  Stable chronic lung disease with bibasilar scarring/atelectasis.  Electronically Signed  By: Aletta Edouard M.D.  On: 10/14/2013 15:50  03/10/14-67 yoF followed for allergic rhinitis, rhinosinusitis, asthmatic bronchitis, complicated by  GERD/ Crohn's, rheumatoid arthritis   Husband here 4 month follow up.  Still has prod cough with clear to yellow mucus in the mornings and qhs.  DOE is unchanged. She complains that since pneumonia in January, she has had a foreign body sensation of "something hanging back there" behind her soft palate.no headache and not blowing much from her nose. No sore throat. She describes how a chiropractor felt something against her forehead and she blew out a small nodular and reticular structure she compares to  "grapes and leaves", with no change in how her nose or throat felt. She has been comfortable since stopping oxygen once she recovered from the pneumonia.   03/27/2014 Acute OV  (Followed for allergic rhinitis, rhinosinusitis, asthmatic bronchitis, complicated by  GERD/ Crohn's, OA ) Complains of  Productive cough, congestion w/ thick yellow and green mucus for 2 weeks . Marland Kitchen Has sore throat.  Was seen by ENT recently , told possible ? GERD, rx Prilosec 59m  . Still has sore throat.  Started taking some left over augmentin few days ago. Has not helped. Last dose of aumgentin this am.  More wheezing  and cough for last few days.  Complains of stress incontinence with coughing .  No fever, hemoptysis, chest pain, orthopnea, edema or n/v/d.  Wants refill of tussionex , cough is keeping her up at night .  Requests Diflucan rx as she gets frequent yeast infections on abx.      Complains of  ROS-see HPI Constitutional:   No-   weight loss, night sweats, fevers, chills, fatigue, lassitude. HEENT:   No-  headaches, difficulty swallowing, tooth/dental problems, sore throat,       No-  sneezing, itching, ear ache, nasal congestion, +post nasal drip,  CV:  No-   chest pain, orthopnea, PND, swelling in lower extremities, anasarca,                                  dizziness, palpitations Resp: No-   shortness of breath with exertion or at rest.              +  productive cough,  No  non-productive cough,  No- coughing up of blood.              No-   change in color of mucus.  No- wheezing.   Skin: No-   rash or lesions. GI:  No-   heartburn, indigestion, abdominal pain, nausea, vomiting,  GU:  MS:  No-   joint pain or swelling.   Neuro-     nothing unusual Psych:  No- change in mood or affect. No depression or anxiety.  No memory loss.  Objective:  OBJ- Physical Exam  General- Alert, Oriented, Affect-appropriate, Distress- none acute Skin- rash-none, lesions- none, excoriation- none Lymphadenopathy- none Head- atraumatic            Eyes- Gross vision intact, PERRLA, conjunctivae and secretions clear            Ears- Hearing, canals-normal            Nose- Clear, no-Septal dev, mucus, polyps, erosion, perforation             Throat- Mallampati II , mucosa clear , drainage- none, tonsils- atrophic, voice normal Neck- flexible , trachea midline, no stridor , thyroid nl, carotid no bruit Chest - symmetrical excursion , unlabored           Heart/CV- RRR , no murmur , no gallop  , no rub, nl s1 s2                           - JVD- none , edema- none, stasis changes- none, varices- none            Lung- barking  Cough, few exp wheezing            Chest wall-  Abd-  Br/ Gen/ Rectal- Not done, not indicated Extrem- scar from treated wound Neuro- grossly intact to observation

## 2014-03-27 NOTE — Patient Instructions (Signed)
Augmentin 868m Twice daily  For 7 days -take with food Prednisone taper over next week.  Mucinex DM Twice daily  As needed  Cough/congestion  Tussionex 1 tsp Twice daily  As needed  Cough, may make you sleepy.  Fluids and rest.  Take Prilosec 465mdaily in am before meal  Add Pepcid 2072mt bedtime   GERD diet  Saline nasal rinses As needed   Please contact office for sooner follow up if symptoms do not improve or worsen or seek emergency care  Follow up Dr. YouAnnamaria Bootsn 6 weeks and As needed

## 2014-04-02 ENCOUNTER — Telehealth: Payer: Self-pay | Admitting: Internal Medicine

## 2014-04-02 MED ORDER — ALBUTEROL SULFATE (2.5 MG/3ML) 0.083% IN NEBU: 2.5000 mg | INHALATION_SOLUTION | Freq: Four times a day (QID) | RESPIRATORY_TRACT | Status: DC | PRN

## 2014-04-02 MED ORDER — PREDNISONE 10 MG PO TABS
ORAL_TABLET | ORAL | Status: DC
Start: 2014-04-02 — End: 2014-05-15

## 2014-04-02 NOTE — Telephone Encounter (Signed)
Pt seen by TP on 9.10.15: Patient Instructions      Augmentin 820m Twice daily  For 7 days -take with food Prednisone taper over next week.   Mucinex DM Twice daily  As needed  Cough/congestion   Tussionex 1 tsp Twice daily  As needed  Cough, may make you sleepy.   Fluids and rest.   Take Prilosec 453mdaily in am before meal   Add Pepcid 2015mt bedtime    GERD diet   Saline nasal rinses As needed    Please contact office for sooner follow up if symptoms do not improve or worsen or seek emergency care  Follow up Dr. YouAnnamaria Bootsn 6 weeks and As needed      Called spoke with patient who reported she has been up coughing all night, dry but feels she has chest congestion ("it feels like it's stuck in there").  Also reports some night sweats, unsure of fever.  Wheezing and dyspnea are unchanged.  She only uses the Tussionex at bedtime as needed and Robitussin DM during the day.  Has 2 doses left of the Augmentin and nearly done with the prednisone.  Pt is requesting rx for diflucan if another round of abx is ordered.  CY out of the office this afternoon - will forward to DOD for recs. Dr ClaGwenette Greetease advise, thank you.  [facility] Allergies  Allergen Reactions  . Lactose Intolerance (Gi) Diarrhea  . Nitrofuran Derivatives     shakiness  . Other Diarrhea    "lettuce only"  . Tdap [Diphth-Acell Pertussis-Tetanus]     Shaking uncontrollably  . Erythromycin Other (See Comments)    Diarrhea   . Tetanus Toxoids Other (See Comments)    Shaking uncontrollable     **pt also requested refill on albuterol neb.  This has been done

## 2014-04-02 NOTE — Telephone Encounter (Signed)
Can't tell that this is bacterial infection, so I suggest she finish augmentin and wait to see before additional antibiotics. Offer extend prednisone 10 mg, # 10 - 2 daily x 3 days then one daily, no ref

## 2014-04-02 NOTE — Telephone Encounter (Signed)
Pt checking in & will patiently await a returned phone call.  [name]

## 2014-04-02 NOTE — Telephone Encounter (Signed)
CY in the office Will route message to him

## 2014-04-02 NOTE — Telephone Encounter (Signed)
Spoke with the pt and notified of recs per CDY  She verbalized understanding  Rx was sent for pred

## 2014-04-14 DIAGNOSIS — Z23 Encounter for immunization: Secondary | ICD-10-CM | POA: Diagnosis not present

## 2014-05-02 ENCOUNTER — Other Ambulatory Visit: Payer: Self-pay

## 2014-05-02 DIAGNOSIS — Z01419 Encounter for gynecological examination (general) (routine) without abnormal findings: Secondary | ICD-10-CM | POA: Diagnosis not present

## 2014-05-02 DIAGNOSIS — Z1231 Encounter for screening mammogram for malignant neoplasm of breast: Secondary | ICD-10-CM | POA: Diagnosis not present

## 2014-05-15 ENCOUNTER — Encounter: Payer: Self-pay | Admitting: Internal Medicine

## 2014-05-15 ENCOUNTER — Ambulatory Visit: Payer: Medicare Other | Admitting: Internal Medicine

## 2014-05-15 VITALS — BP 120/74 | HR 112 | Ht 61.0 in | Wt 192.0 lb

## 2014-05-15 DIAGNOSIS — R09A2 Foreign body sensation, throat: Secondary | ICD-10-CM

## 2014-05-15 NOTE — Progress Notes (Signed)
Subjective:    [name], female    DOB: Apr 27, 1946, 68 y.o.   MRN: [id number]  HPI 02/03/11- 4 yoF never smoker followed for allergic rhinitis, rhinosinusitis, asthmatic bronchitis, complicated by , GERD, rheumatoid arthritis. Last here August, 3, 2011 She has has done much better since last here- maybe because she is past the turmoil of moving last year. Not needing her nebulizer recently. Rescue inhaler used less than once/ week. Talked about the death of her aged mother.  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   02/06/12- 64 yoF followed for allergic rhinitis, rhinosinusitis, asthmatic bronchitis, complicated by , GERD, rheumatoid arthritis   Follows for: takes vaccine and has some sinus drainage when she eats.  She has responded a couple of x2 a Z-Pak when needed. Overall much better since she moved to her new home 2 years ago. Uses her rescue inhaler at bedtime. Less nighttime cough she lies on her side. 5 grandchildren stay with her, which is a stress and the source of colds. Watery rhinorrhea when she is.Troches have worked well for yeast prn.   03/28/2012 Acute OV  Complains of prod cough with w/ green/yellow/white mucus, wheezing, increased SOB, chills x2.5weeks, worse x1day.  OTC not helping .  Cough is keeping her up at night.  No hemoptysis or chest pian .  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  Never smoker.   09/20/12- 66 yoF followed for allergic rhinitis, rhinosinusitis, asthmatic bronchitis, complicated by , GERD/ Crohn's, rheumatoid arthritis  Husband here. FOLLOWS FOR: still having headaches, sinus drainage, and dizzy. Was given Augmentin and cough syrup last week. Had fallen in shower in August 2013-had a hematoma and no broken bones. Now- increased SOB/ DOE x 3 weeks. Dry cough at night. Chronic globus. Mucus plug from nose.  CXR 09/10/12-  IMPRESSION:  No acute findings.  Original Report Authenticated By: Lorin Picket, M.D.  11/08/12- 66 yoF followed for allergic rhinitis, rhinosinusitis, asthmatic bronchitis, complicated by , GERD/ Crohn's, rheumatoid arthritis  Husband here. FOLLOWS JQD:UKRCV on allergy vaccine1:10 GO and doing well; no reactions. Has had flare up with allergies; for about 3 weeks now. Never really cleared completely after exacerbation at last visit when we gave cefdinir.Marland Kitchen Has had increasing cough with scant yellow green sputum starting about a week ago. Denies fever or sore throat. Occasional wheeze. Increased ankle edema. Dr. Arelia Sneddon increased her Lasix. She had fallen on ice during the winter and badly skinned her right pretibial area and left knee. We talked about being seen at a wound center. She is to see a neurologist about her falls. She complains of always feeling very tired and husband says she snores. I educated them on watching for sleep apnea. CXR 09/10/12 IMPRESSION:  No acute findings.  Original Report Authenticated By: Lorin Picket, M.D.  06/11/13-   05/10/13- 67 yoF followed for allergic rhinitis, rhinosinusitis, asthmatic bronchitis, complicated by , GERD/ Crohn's, rheumatoid arthritis  Husband here. Allergy vaccine1:10 GO FOLLOWS FOR:  Sinus pressure w/ PND and cough w/ mucus green in color x2 months.  Also, having abdominal pain and frequent urination Had flu vaccine. Describes 2 months increased sinus drainage with some green mucus, frontal headaches. No fever, ear pressure or wheezing. Still recovering from abrasion to legs when she fell last winter. Has been going to wound Center.  05/10/13- 67 yoF followed for allergic rhinitis, rhinosinusitis, asthmatic bronchitis, complicated by , GERD/ Crohn's, rheumatoid arthritis  Husband here.  Allergy vaccine1:10 GO  FOLLOWS FOR: Sinus pressure w/ PND and cough w/ mucus green in color x2 months. Also, having abdominal pain and frequent urination  Had flu vaccine. Describes 2 months increased sinus drainage with some  green mucus, frontal headaches. No fever, ear pressure or wheezing.  Still recovering from abrasion to legs when she fell last winter. Has been going to wound Center.  06/11/13- 67 yoF followed for allergic rhinitis, rhinosinusitis, asthmatic bronchitis, complicated by , GERD/ Crohn's, rheumatoid arthritis   c/o sinus congestion x 1 wk. feels like fluid in head and ears,no ear pain,coughing at night-yellow and green,feels like fever today,nasal  congestion -clear This began with an upper respiratory infection but has become focused in her head She continues allergy vaccine 1: 10 GO  07/31/13 Acute OV  Complains of prod cough with green/yellow mucus, increased SOB, wheezing, fever, body aches.  began Augmentin and steroids on 1/13, fever began last night.  symptoms worse last night.  Patient says she was seen by her family doctor on January 12 and recommend to start on a prednisone taper. She was also called in Augmentin and Phenergan With Codeine cough syrup, which she began yesterday. She complains of a productive cough with thick, yellow green mucus. She's had fever that began last night. MAXIMUM TEMPERATURE 101.5. She also has body aches. She did have the flu shot. Patient's husband has had similar symptoms, but milder She denies any nausea, vomiting, diarrhea, abdominal pain, rash, recent travel Patient is eating, but has a decreased appetite. She says that Phenergan, codeine cough syrup is not helping. She is coughing nonstop. She denies any frank, hemoptysis, calf pain, orthopnea, or edema. Did notice a speck of blood. This morning, when she coughed very hard. >refer to ER for admit for bilateral PNA   08/13/2013 [facility] follow up  Returns for a post hospital follow up . Was admitted for bilateral PNA. Reports is much improved since discharge.  Finished abx 2 days ago, still taking pred taper..  CXR today shows near complete resolution of bilateral aspdz. She is feeling much better.   Still has some weakness.  No fever, chest pain or edema.  Appetite is returning. No n/v//d.   08/30/13- 67 yoF followed for allergic rhinitis, rhinosinusitis, asthmatic bronchitis, complicated by , GERD/ Crohn's, rheumatoid arthritis   Husband here FOLLOWS FOR:  Increased sob, wheezing and cough with green mucus x3 days Acute visit, for 5 days of new cold after resolving bronchitis exacerbation a few weeks ago. Exposed to grandchildren who were sick. Immunosuppressed by her rheumatoid arthritis and Crohn's disease. Increased dry cough, throat uncomfortable, low-grade fever, nasal congestion with yellow/green mucus CXR 08/13/13 IMPRESSION:  Near-complete resolution of left effusion and basilar airspace  disease. No new abnormality.  Electronically Signed  By: Inge Rise M.D.  On: 08/13/2013 11:21  09/05/13- 76 yoF followed for allergic rhinitis, rhinosinusitis, asthmatic bronchitis, complicated by  GERD/ Crohn's, rheumatoid arthritis   Husband here ACUTE  VISIT: patient having low O2 levels, SOB, and fatigued-just wants to sleep. Not eating well at all. She had come off of prednisone. Glucose was 144. She denies fever , purulent sputum, chest pain. >>BREO rx   10/14/13 Acute OV - TP Complains of persistent symptoms.  Complains that she feels her breathing is getting worse. Wears out breath easily . Last ov with persistent desats, given BREO  . Did not feel this helped much at all.  Complains of prod cough with dark green  mucus, increased SOB, chest tightness x1 week.  Denies any f/c/s, nausea, vomiting, hemoptysis.  sats were 86% RA upon entering exam room.   Was called Doxycycline last week, finished on 3/27 Congestion is better but still having DOE . Slow to resolve flare now w/ persistent exertional hypoxemia  Check CXR today  Consider PFTs in future  Plan  Prednisone taper over next week  Chest xray today .  Begin Oxygen 2l/m with activity and At bedtime  Follow up Dr. Annamaria Boots  In 3-4 weeks and As needed  Please contact office for sooner follow up if symptoms do not improve or worsen or seek emergency care   11/12/13- 67 yoF followed for allergic rhinitis, rhinosinusitis, asthmatic bronchitis, complicated by  GERD/ Crohn's, rheumatoid arthritis   Husband here FOLLOWS FOR:  Cough has improved.  Now on 2 - 3 L O2 / [employer] sleep/ exertion. Needed oxygen after last visit, used for sleep and as needed. Green sputum has now cleared. Treating postnasal drip with Claritin and Mucinex DM. Continues allergy vaccine 1:10 GO CXR 10/14/13 IMPRESSION:  Stable chronic lung disease with bibasilar scarring/atelectasis.  Electronically Signed  By: Aletta Edouard M.D.  On: 10/14/2013 15:50  03/10/14-67 yoF followed for allergic rhinitis, rhinosinusitis, asthmatic bronchitis, complicated by  GERD/ Crohn's, rheumatoid arthritis   Husband here 4 month follow up.  Still has prod cough with clear to yellow mucus in the mornings and qhs.  DOE is unchanged. She complains that since pneumonia in January, she has had a foreign body sensation of "something hanging back there" behind her soft palate.no headache and not blowing much from her nose. No sore throat. She describes how a chiropractor felt something against her forehead and she blew out a small nodular and reticular structure she compares to  "grapes and leaves", with no change in how her nose or throat felt. She has been comfortable since stopping oxygen once she recovered from the pneumonia.   03/27/2014 Acute OV  (Followed for allergic rhinitis, rhinosinusitis, asthmatic bronchitis, complicated by  GERD/ Crohn's, OA ) Complains of  Productive cough, congestion w/ thick yellow and green mucus for 2 weeks . Marland Kitchen Has sore throat.  Was seen by ENT recently , told possible ? GERD, rx Prilosec 62m  . Still has sore throat.  Started taking some left over augmentin few days ago. Has not helped. Last dose of aumgentin this am.  More wheezing  and cough for last few days.  Complains of stress incontinence with coughing .  No fever, hemoptysis, chest pain, orthopnea, edema or n/v/d.  Wants refill of tussionex , cough is keeping her up at night .  Requests Diflucan rx as she gets frequent yeast infections on abx.   05/15/14- 67 yoF followed for allergic rhinitis, rhinosinusitis, asthmatic bronchitis, complicated by  GERD/ Crohn's, rheumatoid arthritis   Husband here FOLLOWS FOR: patient feels like something in her throat.    ROS-see HPI Constitutional:   No-   weight loss, night sweats, fevers, chills, fatigue, lassitude. HEENT:   No-  headaches, difficulty swallowing, tooth/dental problems, sore throat,       No-  sneezing, itching, ear ache, nasal congestion, +post nasal drip,  CV:  No-   chest pain, orthopnea, PND, swelling in lower extremities, anasarca,                                  dizziness, palpitations Resp: No-  shortness of breath with exertion or at rest.              +  productive cough,  No non-productive cough,  No- coughing up of blood.              No-   change in color of mucus.  No- wheezing.   Skin: No-   rash or lesions. GI:  No-   heartburn, indigestion, abdominal pain, nausea, vomiting,  GU:  MS:  No-   joint pain or swelling.   Neuro-     nothing unusual Psych:  No- change in mood or affect. No depression or anxiety.  No memory loss.  Objective:  OBJ- Physical Exam  General- Alert, Oriented, Affect-appropriate, Distress- none acute Skin- rash-none, lesions- none, excoriation- none Lymphadenopathy- none Head- atraumatic            Eyes- Gross vision intact, PERRLA, conjunctivae and secretions clear            Ears- Hearing, canals-normal            Nose- Clear, no-Septal dev, mucus, polyps, erosion, perforation             Throat- Mallampati II , mucosa clear , drainage- none, tonsils- atrophic, voice normal Neck- flexible , trachea midline, no stridor , thyroid nl, carotid no bruit Chest -  symmetrical excursion , unlabored           Heart/CV- RRR , no murmur , no gallop  , no rub, nl s1 s2                           - JVD- none , edema- none, stasis changes- none, varices- none           Lung- barking  Cough, few exp wheezing            Chest wall-  Abd-  Br/ Gen/ Rectal- Not done, not indicated Extrem- scar from treated wound Neuro- grossly intact to observation

## 2014-05-15 NOTE — Patient Instructions (Signed)
Throat lozenges as needed  Please call as needed

## 2014-05-21 ENCOUNTER — Other Ambulatory Visit: Payer: Self-pay | Admitting: Internal Medicine

## 2014-05-22 ENCOUNTER — Encounter: Payer: Self-pay | Admitting: Internal Medicine

## 2014-05-22 DIAGNOSIS — K219 Gastro-esophageal reflux disease without esophagitis: Secondary | ICD-10-CM | POA: Diagnosis not present

## 2014-05-22 DIAGNOSIS — A09 Infectious gastroenteritis and colitis, unspecified: Secondary | ICD-10-CM | POA: Diagnosis not present

## 2014-05-28 DIAGNOSIS — D89811 Chronic graft-versus-host disease: Secondary | ICD-10-CM | POA: Diagnosis not present

## 2014-05-28 DIAGNOSIS — M19042 Primary osteoarthritis, left hand: Secondary | ICD-10-CM | POA: Diagnosis not present

## 2014-05-28 DIAGNOSIS — Z7952 Long term (current) use of systemic steroids: Secondary | ICD-10-CM | POA: Diagnosis not present

## 2014-05-28 DIAGNOSIS — M7532 Calcific tendinitis of left shoulder: Secondary | ICD-10-CM | POA: Diagnosis not present

## 2014-05-28 DIAGNOSIS — F5101 Primary insomnia: Secondary | ICD-10-CM | POA: Diagnosis not present

## 2014-05-28 DIAGNOSIS — M19041 Primary osteoarthritis, right hand: Secondary | ICD-10-CM | POA: Diagnosis not present

## 2014-06-04 ENCOUNTER — Telehealth: Payer: Self-pay | Admitting: Internal Medicine

## 2014-06-04 MED ORDER — AZITHROMYCIN 250 MG PO TABS: ORAL_TABLET | ORAL | Status: DC

## 2014-06-04 MED ORDER — PROMETHAZINE-CODEINE 6.25-10 MG/5ML PO SYRP: 5.0000 mL | ORAL_SOLUTION | Freq: Four times a day (QID) | ORAL | Status: DC | PRN

## 2014-06-04 NOTE — Telephone Encounter (Addendum)
Pt last seen 05/15/14 Pt c/o increased sore throat and cough. Pt states that chest is very full and she is unable to get anything up. Denies SOB and chest tightness Requests something to help suppress the cough (Rx) Please advise Dr Annamaria Boots. Thanks.   Allergies  Allergen Reactions  . Lactose Intolerance (Gi) Diarrhea  . Nitrofuran Derivatives     shakiness  . Other Diarrhea    "lettuce only"  . Tdap [Diphth-Acell Pertussis-Tetanus]     Shaking uncontrollably  . Erythromycin Other (See Comments)    Diarrhea   . Tetanus Toxoids Other (See Comments)    Shaking uncontrollable

## 2014-06-04 NOTE — Telephone Encounter (Signed)
Pt would like abx and cough syrup called to [facility]. Pt is aware of rx's and I have called them to her pharmacy. Nothing more needed at this time.

## 2014-06-04 NOTE — Telephone Encounter (Signed)
Sent to wrong doctor

## 2014-06-04 NOTE — Telephone Encounter (Signed)
If just needs cough syrup- prometh codeine 200 ml, 38m every 6 hours prn If needs antibiotic- offer Zpak If needs help coughing chest clear- suggest otc Mucinex, extra fluids

## 2014-06-10 ENCOUNTER — Ambulatory Visit (INDEPENDENT_AMBULATORY_CARE_PROVIDER_SITE_OTHER)
Admission: RE | Admit: 2014-06-10 | Discharge: 2014-06-10 | Disposition: A | Discharge status: Home / Self Care | Payer: Medicare Other | Source: Ambulatory Visit | Attending: Internal Medicine | Admitting: Internal Medicine

## 2014-06-10 ENCOUNTER — Encounter: Payer: Self-pay | Admitting: Internal Medicine

## 2014-06-10 ENCOUNTER — Ambulatory Visit (INDEPENDENT_AMBULATORY_CARE_PROVIDER_SITE_OTHER): Payer: Medicare Other | Admitting: Internal Medicine

## 2014-06-10 VITALS — BP 122/66 | HR 92 | Temp 97.0°F | Ht 61.0 in | Wt 188.4 lb

## 2014-06-10 DIAGNOSIS — J302 Other seasonal allergic rhinitis: Secondary | ICD-10-CM

## 2014-06-10 DIAGNOSIS — J209 Acute bronchitis, unspecified: Secondary | ICD-10-CM

## 2014-06-10 DIAGNOSIS — J42 Unspecified chronic bronchitis: Secondary | ICD-10-CM | POA: Diagnosis not present

## 2014-06-10 DIAGNOSIS — R05 Cough: Secondary | ICD-10-CM | POA: Diagnosis not present

## 2014-06-10 DIAGNOSIS — J309 Allergic rhinitis, unspecified: Secondary | ICD-10-CM | POA: Diagnosis not present

## 2014-06-10 DIAGNOSIS — B370 Candidal stomatitis: Secondary | ICD-10-CM | POA: Diagnosis not present

## 2014-06-10 DIAGNOSIS — R0989 Other specified symptoms and signs involving the circulatory and respiratory systems: Secondary | ICD-10-CM | POA: Diagnosis not present

## 2014-06-10 DIAGNOSIS — J3089 Other allergic rhinitis: Secondary | ICD-10-CM

## 2014-06-10 MED ORDER — AMOXICILLIN-POT CLAVULANATE 875-125 MG PO TABS: 1.0000 | ORAL_TABLET | Freq: Two times a day (BID) | ORAL | Status: DC

## 2014-06-10 MED ORDER — FLUCONAZOLE 150 MG PO TABS: ORAL_TABLET | ORAL | Status: DC

## 2014-06-10 MED ORDER — CODEINE POLST-CHLORPHEN POLST 14.7-2.8 MG/5ML PO LQCR: 10.0000 mL | Freq: Two times a day (BID) | ORAL | Status: DC | PRN

## 2014-06-10 NOTE — Progress Notes (Signed)
Subjective:    [name], female    DOB: Apr 27, 1946, 68 y.o.   MRN: [id number]  HPI 02/03/11- 4 yoF never smoker followed for allergic rhinitis, rhinosinusitis, asthmatic bronchitis, complicated by , GERD, rheumatoid arthritis. Last here August, 3, 2011 She has has done much better since last here- maybe because she is past the turmoil of moving last year. Not needing her nebulizer recently. Rescue inhaler used less than once/ week. Talked about the death of her aged mother.  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   02/06/12- 64 yoF followed for allergic rhinitis, rhinosinusitis, asthmatic bronchitis, complicated by , GERD, rheumatoid arthritis   Follows for: takes vaccine and has some sinus drainage when she eats.  She has responded a couple of x2 a Z-Pak when needed. Overall much better since she moved to her new home 2 years ago. Uses her rescue inhaler at bedtime. Less nighttime cough she lies on her side. 5 grandchildren stay with her, which is a stress and the source of colds. Watery rhinorrhea when she is.Troches have worked well for yeast prn.   03/28/2012 Acute OV  Complains of prod cough with w/ green/yellow/white mucus, wheezing, increased SOB, chills x2.5weeks, worse x1day.  OTC not helping .  Cough is keeping her up at night.  No hemoptysis or chest pian .  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  Never smoker.   09/20/12- 66 yoF followed for allergic rhinitis, rhinosinusitis, asthmatic bronchitis, complicated by , GERD/ Crohn's, rheumatoid arthritis  Husband here. FOLLOWS FOR: still having headaches, sinus drainage, and dizzy. Was given Augmentin and cough syrup last week. Had fallen in shower in August 2013-had a hematoma and no broken bones. Now- increased SOB/ DOE x 3 weeks. Dry cough at night. Chronic globus. Mucus plug from nose.  CXR 09/10/12-  IMPRESSION:  No acute findings.  Original Report Authenticated By: Lorin Picket, M.D.  11/08/12- 66 yoF followed for allergic rhinitis, rhinosinusitis, asthmatic bronchitis, complicated by , GERD/ Crohn's, rheumatoid arthritis  Husband here. FOLLOWS JQD:UKRCV on allergy vaccine1:10 GO and doing well; no reactions. Has had flare up with allergies; for about 3 weeks now. Never really cleared completely after exacerbation at last visit when we gave cefdinir.Marland Kitchen Has had increasing cough with scant yellow green sputum starting about a week ago. Denies fever or sore throat. Occasional wheeze. Increased ankle edema. Dr. Arelia Sneddon increased her Lasix. She had fallen on ice during the winter and badly skinned her right pretibial area and left knee. We talked about being seen at a wound center. She is to see a neurologist about her falls. She complains of always feeling very tired and husband says she snores. I educated them on watching for sleep apnea. CXR 09/10/12 IMPRESSION:  No acute findings.  Original Report Authenticated By: Lorin Picket, M.D.  06/11/13-   05/10/13- 67 yoF followed for allergic rhinitis, rhinosinusitis, asthmatic bronchitis, complicated by , GERD/ Crohn's, rheumatoid arthritis  Husband here. Allergy vaccine1:10 GO FOLLOWS FOR:  Sinus pressure w/ PND and cough w/ mucus green in color x2 months.  Also, having abdominal pain and frequent urination Had flu vaccine. Describes 2 months increased sinus drainage with some green mucus, frontal headaches. No fever, ear pressure or wheezing. Still recovering from abrasion to legs when she fell last winter. Has been going to wound Center.  05/10/13- 67 yoF followed for allergic rhinitis, rhinosinusitis, asthmatic bronchitis, complicated by , GERD/ Crohn's, rheumatoid arthritis  Husband here.  Allergy vaccine1:10 GO  FOLLOWS FOR: Sinus pressure w/ PND and cough w/ mucus green in color x2 months. Also, having abdominal pain and frequent urination  Had flu vaccine. Describes 2 months increased sinus drainage with some  green mucus, frontal headaches. No fever, ear pressure or wheezing.  Still recovering from abrasion to legs when she fell last winter. Has been going to wound Center.  06/11/13- 67 yoF followed for allergic rhinitis, rhinosinusitis, asthmatic bronchitis, complicated by , GERD/ Crohn's, rheumatoid arthritis   c/o sinus congestion x 1 wk. feels like fluid in head and ears,no ear pain,coughing at night-yellow and green,feels like fever today,nasal  congestion -clear This began with an upper respiratory infection but has become focused in her head She continues allergy vaccine 1: 10 GO  07/31/13 Acute OV  Complains of prod cough with green/yellow mucus, increased SOB, wheezing, fever, body aches.  began Augmentin and steroids on 1/13, fever began last night.  symptoms worse last night.  Patient says she was seen by her family doctor on January 12 and recommend to start on a prednisone taper. She was also called in Augmentin and Phenergan With Codeine cough syrup, which she began yesterday. She complains of a productive cough with thick, yellow green mucus. She's had fever that began last night. MAXIMUM TEMPERATURE 101.5. She also has body aches. She did have the flu shot. Patient's husband has had similar symptoms, but milder She denies any nausea, vomiting, diarrhea, abdominal pain, rash, recent travel Patient is eating, but has a decreased appetite. She says that Phenergan, codeine cough syrup is not helping. She is coughing nonstop. She denies any frank, hemoptysis, calf pain, orthopnea, or edema. Did notice a speck of blood. This morning, when she coughed very hard. >refer to ER for admit for bilateral PNA   08/13/2013 [facility] follow up  Returns for a post hospital follow up . Was admitted for bilateral PNA. Reports is much improved since discharge.  Finished abx 2 days ago, still taking pred taper..  CXR today shows near complete resolution of bilateral aspdz. She is feeling much better.   Still has some weakness.  No fever, chest pain or edema.  Appetite is returning. No n/v//d.   08/30/13- 67 yoF followed for allergic rhinitis, rhinosinusitis, asthmatic bronchitis, complicated by , GERD/ Crohn's, rheumatoid arthritis   Husband here FOLLOWS FOR:  Increased sob, wheezing and cough with green mucus x3 days Acute visit, for 5 days of new cold after resolving bronchitis exacerbation a few weeks ago. Exposed to grandchildren who were sick. Immunosuppressed by her rheumatoid arthritis and Crohn's disease. Increased dry cough, throat uncomfortable, low-grade fever, nasal congestion with yellow/green mucus CXR 08/13/13 IMPRESSION:  Near-complete resolution of left effusion and basilar airspace  disease. No new abnormality.  Electronically Signed  By: Inge Rise M.D.  On: 08/13/2013 11:21  09/05/13- 76 yoF followed for allergic rhinitis, rhinosinusitis, asthmatic bronchitis, complicated by  GERD/ Crohn's, rheumatoid arthritis   Husband here ACUTE  VISIT: patient having low O2 levels, SOB, and fatigued-just wants to sleep. Not eating well at all. She had come off of prednisone. Glucose was 144. She denies fever , purulent sputum, chest pain. >>BREO rx   10/14/13 Acute OV - TP Complains of persistent symptoms.  Complains that she feels her breathing is getting worse. Wears out breath easily . Last ov with persistent desats, given BREO  . Did not feel this helped much at all.  Complains of prod cough with dark green  mucus, increased SOB, chest tightness x1 week.  Denies any f/c/s, nausea, vomiting, hemoptysis.  sats were 86% RA upon entering exam room.   Was called Doxycycline last week, finished on 3/27 Congestion is better but still having DOE . Slow to resolve flare now w/ persistent exertional hypoxemia  Check CXR today  Consider PFTs in future  Plan  Prednisone taper over next week  Chest xray today .  Begin Oxygen 2l/m with activity and At bedtime  Follow up Dr. Annamaria Boots  In 3-4 weeks and As needed  Please contact office for sooner follow up if symptoms do not improve or worsen or seek emergency care   11/12/13- 67 yoF followed for allergic rhinitis, rhinosinusitis, asthmatic bronchitis, complicated by  GERD/ Crohn's, rheumatoid arthritis   Husband here FOLLOWS FOR:  Cough has improved.  Now on 2 - 3 L O2 / [employer] sleep/ exertion. Needed oxygen after last visit, used for sleep and as needed. Green sputum has now cleared. Treating postnasal drip with Claritin and Mucinex DM. Continues allergy vaccine 1:10 GO CXR 10/14/13 IMPRESSION:  Stable chronic lung disease with bibasilar scarring/atelectasis.  Electronically Signed  By: Aletta Edouard M.D.  On: 10/14/2013 15:50  03/10/14-67 yoF followed for allergic rhinitis, rhinosinusitis, asthmatic bronchitis, complicated by  GERD/ Crohn's, rheumatoid arthritis   Husband here 4 month follow up.  Still has prod cough with clear to yellow mucus in the mornings and qhs.  DOE is unchanged. She complains that since pneumonia in January, she has had a foreign body sensation of "something hanging back there" behind her soft palate.no headache and not blowing much from her nose. No sore throat. She describes how a chiropractor felt something against her forehead and she blew out a small nodular and reticular structure she compares to  "grapes and leaves", with no change in how her nose or throat felt. She has been comfortable since stopping oxygen once she recovered from the pneumonia.   03/27/2014 Acute OV  (Followed for allergic rhinitis, rhinosinusitis, asthmatic bronchitis, complicated by  GERD/ Crohn's, OA ) Complains of  Productive cough, congestion w/ thick yellow and green mucus for 2 weeks . Marland Kitchen Has sore throat.  Was seen by ENT recently , told possible ? GERD, rx Prilosec 58m  . Still has sore throat.  Started taking some left over augmentin few days ago. Has not helped. Last dose of aumgentin this am.  More wheezing  and cough for last few days.  Complains of stress incontinence with coughing .  No fever, hemoptysis, chest pain, orthopnea, edema or n/v/d.  Wants refill of tussionex , cough is keeping her up at night .  Requests Diflucan rx as she gets frequent yeast infections on abx.   05/15/14- 67 yoF followed for allergic rhinitis, rhinosinusitis, asthmatic bronchitis, complicated by  GERD/ Crohn's, rheumatoid arthritis   Husband here FOLLOWS FOR: patient feels like something in her throat.   06/10/14- 89yoF followed for allergic rhinitis, rhinosinusitis, asthmatic bronchitis, complicated by  GERD/ Crohn's, rheumatoid arthritis    FOLLOWS FOR: wheezing, cough-yellow in color, SOB as well. ? fever as well. We sent Zpak and cough syrup 6 days ago. Continues allergy vaccine 1:10 GO  ROS-see HPI Constitutional:   No-   weight loss, night sweats, fevers, chills, fatigue, lassitude. HEENT:   No-  headaches, difficulty swallowing, tooth/dental problems, sore throat,       No-  sneezing, itching, ear ache, nasal congestion, +post nasal drip,  CV:  No-   chest  pain, orthopnea, PND, swelling in lower extremities, anasarca,                                  dizziness, palpitations Resp: No-   shortness of breath with exertion or at rest.              +  productive cough,  + non-productive cough,  No- coughing up of blood.             +   change in color of mucus.  No- wheezing.   Skin: No-   rash or lesions. GI:  No-   heartburn, indigestion, abdominal pain, nausea, vomiting,  GU:  MS:  No-   joint pain or swelling.   Neuro-     nothing unusual Psych:  No- change in mood or affect. No depression or anxiety.  No memory loss.  Objective:  OBJ- Physical Exam  General- Alert, Oriented, Affect-appropriate, Distress- none acute Skin- rash-none, lesions- none, excoriation- none Lymphadenopathy- none Head- atraumatic            Eyes- Gross vision intact, PERRLA, conjunctivae and secretions clear             Ears- Hearing, canals-normal            Nose- Clear, no-Septal dev, mucus, polyps, erosion, perforation             Throat- Mallampati II , mucosa+thrush , drainage- none, tonsils-                       atrophic, voice normal Neck- flexible , trachea midline, no stridor , thyroid nl, carotid no bruit Chest - symmetrical excursion , unlabored           Heart/CV- RRR , no murmur , no gallop  , no rub, nl s1 s2                           - JVD- none , edema- none, stasis changes- none, varices- none           Lung-  +Deep barking cough, wheezing -none           Chest wall-  Abd-  Br/ Gen/ Rectal- Not done, not indicated Extrem- scar from treated wound Neuro- grossly intact to observation

## 2014-06-10 NOTE — Patient Instructions (Addendum)
Order- CXR    Dx acute bronchitis  Script sent for Diflucan  Script sent for Augmentin  Script printed for TuzistraXR cough syrup- try 1/2 to 1 tablespoon every 12 hours as needed, like Tussionex  Please call as needed

## 2014-06-12 DIAGNOSIS — B370 Candidal stomatitis: Secondary | ICD-10-CM | POA: Insufficient documentation

## 2014-06-12 NOTE — Assessment & Plan Note (Signed)
Discussed effect of antibiotics Plan- Diflucan

## 2014-06-12 NOTE — Assessment & Plan Note (Signed)
She continues allergy vaccine

## 2014-06-12 NOTE — Assessment & Plan Note (Signed)
Acute exacerbation in the past week, probably viral. Can't exclude bronchopneumonia Plan - augmentin, CXR, cough syrup

## 2014-06-18 ENCOUNTER — Telehealth: Payer: Self-pay | Admitting: Internal Medicine

## 2014-06-18 NOTE — Telephone Encounter (Signed)
Per 06/10/14 OV w/ CDY: Patient Instructions       Order- CXR    Dx acute bronchitis Script sent for Diflucan Script sent for Augmentin Script printed for TuzistraXR cough syrup- try 1/2 to 1 tablespoon every 12 hours as needed, like Tussionex Please call as needed   Called spoke with pt. She c/o prod cough (yellow) and is still wheezing. Reports the cough is better from last visit but just not getting over it. She has finished augmentin and her cough syurp RX'd to her. Please advise Dr. Annamaria Boots thanks  Allergies  Allergen Reactions  . Lactose Intolerance (Gi) Diarrhea  . Nitrofuran Derivatives     shakiness  . Other Diarrhea    "lettuce only"  . Tdap [Diphth-Acell Pertussis-Tetanus]     Shaking uncontrollably  . Erythromycin Other (See Comments)    Diarrhea   . Tetanus Toxoids Other (See Comments)    Shaking uncontrollable       Current Outpatient Prescriptions on File Prior to Visit  Medication Sig Dispense Refill  . albuterol (PROAIR HFA) 108 (90 BASE) MCG/ACT inhaler Inhale 2 puffs into the lungs every 4 (four) hours as needed for wheezing or shortness of breath. 1 Inhaler 3  . albuterol (PROVENTIL) (2.5 MG/3ML) 0.083% nebulizer solution Take 3 mLs (2.5 mg total) by nebulization every 6 (six) hours as needed for wheezing or shortness of breath. 75 mL 5  . amoxicillin-clavulanate (AUGMENTIN) 875-125 MG per tablet Take 1 tablet by mouth 2 (two) times daily. 14 tablet 0  . aspirin EC 81 MG tablet Take 81 mg by mouth daily.    . benzonatate (TESSALON) 100 MG capsule TAKE 1 CAPSULE (100 MG TOTAL) BY MOUTH 4 (FOUR) TIMES DAILY AS NEEDED FOR COUGH. 50 capsule 5  . celecoxib (CELEBREX) 200 MG capsule Take 1 capsule by mouth daily.    . Codeine Polst-Chlorphen Polst (TUZISTRA XR) 14.7-2.8 MG/5ML LQCR Take 10 mLs by mouth every 12 (twelve) hours as needed. 120 mL 0  . colestipol (COLESTID) 1 G tablet Take 1.5 g by mouth daily. Take 1.5 tabs in am and 0.5 tabs at dinner    .  DiphenhydrAMINE HCl, Sleep, (UNISOM SLEEPMELTS PO) Take by mouth at bedtime.    . ergocalciferol (VITAMIN D2) 50000 UNITS capsule Take 50,000 Units by mouth every 14 (fourteen) days.      . fexofenadine (ALLEGRA) 180 MG tablet Take 90 mg by mouth at bedtime.    . fexofenadine-pseudoephedrine (ALLEGRA-D 24) 180-240 MG per 24 hr tablet Take 1 tablet by mouth daily.    . fluconazole (DIFLUCAN) 150 MG tablet 1 every other day 4 tablet 1  . FLUoxetine (PROZAC) 20 MG tablet Take 20 mg by mouth daily.      . fluticasone (FLONASE) 50 MCG/ACT nasal spray Place 2 sprays into the nose daily.     . furosemide (LASIX) 20 MG tablet Take 40 mg by mouth daily.     Marland Kitchen glycopyrrolate (ROBINUL) 1 MG tablet Take 1 mg by mouth 3 (three) times daily as needed (for IBS).     Marland Kitchen guaiFENesin (MUCINEX) 600 MG 12 hr tablet Take 1,200 mg by mouth 2 (two) times daily as needed.    Marland Kitchen ipratropium (ATROVENT) 0.03 % nasal spray USE 1 TO 2 SPRAYS IN EACH NOSTRIL 3 TIMES DAILY AS NEEDED 30 mL 5  . Menthol, Topical Analgesic, (BLUE-EMU MAXIMUM STRENGTH EX) Apply 1 application topically daily as needed (for hip, back pain).    . mirtazapine (REMERON) 15 MG  tablet Take 45 mg by mouth at bedtime.     . montelukast (SINGULAIR) 10 MG tablet TAKE 1 TABLET AT BEDTIME 90 tablet 3  . omeprazole (PRILOSEC) 40 MG capsule Take 40 mg by mouth daily.    . potassium chloride SA (K-DUR,KLOR-CON) 20 MEQ tablet Take 20 mEq by mouth 3 (three) times daily.    . pravastatin (PRAVACHOL) 40 MG tablet Take 40 mg by mouth daily.     . promethazine-codeine (PHENERGAN WITH CODEINE) 6.25-10 MG/5ML syrup Take 5 mLs by mouth every 6 (six) hours as needed for cough. 200 mL 0  . traMADol (ULTRAM) 50 MG tablet Take 50 mg by mouth every 8 (eight) hours as needed for pain.     No current facility-administered medications on file prior to visit.

## 2014-06-18 NOTE — Telephone Encounter (Signed)
Will send to [facility] (dr of the day) as Dr Annamaria Boots is gone for the day.

## 2014-06-18 NOTE — Telephone Encounter (Signed)
Since she has already been treated with antibiotic and not better, may need office visit.  This can wait until in am when Dr. Annamaria Boots is back

## 2014-06-18 NOTE — Telephone Encounter (Signed)
Called spoke with pt. She is fine with waiting for CDY to return in AM.

## 2014-06-19 MED ORDER — PROMETHAZINE-CODEINE 6.25-10 MG/5ML PO SYRP: 5.0000 mL | ORAL_SOLUTION | Freq: Four times a day (QID) | ORAL | Status: DC | PRN

## 2014-06-19 MED ORDER — PREDNISONE 10 MG PO TABS: ORAL_TABLET | ORAL | Status: DC

## 2014-06-19 NOTE — Telephone Encounter (Signed)
Dr Annamaria Boots please advise on message below. Pt is calling again. Thanks.

## 2014-06-19 NOTE — Telephone Encounter (Signed)
Pt is returning call from yesterday please advise.[name]

## 2014-06-19 NOTE — Telephone Encounter (Signed)
Try prednisone 10 mg- 3 x 3 days, 2 x 3 days, the 1 daily x 7 days. # 22  We can add a cough syrup like prometh codeine 200 ml, 5 ml every 6 hours, if she needs

## 2014-06-19 NOTE — Telephone Encounter (Signed)
Pt aware of recs.  Would like cough syrup called in.  Meds sent in, nothing further needed.

## 2014-06-24 ENCOUNTER — Ambulatory Visit (INDEPENDENT_AMBULATORY_CARE_PROVIDER_SITE_OTHER): Payer: Medicare Other

## 2014-06-24 DIAGNOSIS — J309 Allergic rhinitis, unspecified: Secondary | ICD-10-CM

## 2014-06-25 ENCOUNTER — Other Ambulatory Visit: Payer: Self-pay | Admitting: Internal Medicine

## 2014-07-02 ENCOUNTER — Telehealth: Payer: Self-pay | Admitting: Internal Medicine

## 2014-07-02 DIAGNOSIS — M19042 Primary osteoarthritis, left hand: Secondary | ICD-10-CM | POA: Diagnosis not present

## 2014-07-02 DIAGNOSIS — M19041 Primary osteoarthritis, right hand: Secondary | ICD-10-CM | POA: Diagnosis not present

## 2014-07-02 DIAGNOSIS — M7532 Calcific tendinitis of left shoulder: Secondary | ICD-10-CM | POA: Diagnosis not present

## 2014-07-02 DIAGNOSIS — R7989 Other specified abnormal findings of blood chemistry: Secondary | ICD-10-CM | POA: Diagnosis not present

## 2014-07-02 NOTE — Telephone Encounter (Signed)
Pt returned call Pt is already scheduled for appt with CY on 12/17 @ 1045 Advised that CY is not in the office this afternoon and offered to send message to doc of day if she would like recs today.  Pt would prefer to keep the 12.17 appt with CY and receive his recommendations. Nothing further needed at this time; will sign off

## 2014-07-02 NOTE — Telephone Encounter (Signed)
lmomtcb x 1

## 2014-07-03 ENCOUNTER — Encounter: Payer: Self-pay | Admitting: Internal Medicine

## 2014-07-03 ENCOUNTER — Ambulatory Visit (INDEPENDENT_AMBULATORY_CARE_PROVIDER_SITE_OTHER): Payer: Medicare Other | Admitting: Internal Medicine

## 2014-07-03 VITALS — BP 120/70 | HR 81 | Ht 62.0 in | Wt 183.4 lb

## 2014-07-03 DIAGNOSIS — J3089 Other allergic rhinitis: Secondary | ICD-10-CM

## 2014-07-03 DIAGNOSIS — J302 Other seasonal allergic rhinitis: Secondary | ICD-10-CM

## 2014-07-03 DIAGNOSIS — J441 Chronic obstructive pulmonary disease with (acute) exacerbation: Secondary | ICD-10-CM

## 2014-07-03 DIAGNOSIS — J309 Allergic rhinitis, unspecified: Secondary | ICD-10-CM | POA: Diagnosis not present

## 2014-07-03 MED ORDER — PROMETHAZINE-CODEINE 6.25-10 MG/5ML PO SYRP: 5.0000 mL | ORAL_SOLUTION | Freq: Four times a day (QID) | ORAL | Status: DC | PRN

## 2014-07-03 MED ORDER — PREDNISONE 5 MG PO TABS
ORAL_TABLET | ORAL | Status: DC
Start: 2014-07-03 — End: 2015-09-14

## 2014-07-03 MED ORDER — IPRATROPIUM BROMIDE 0.03 % NA SOLN: NASAL | Status: DC

## 2014-07-03 MED ORDER — ALBUTEROL SULFATE (2.5 MG/3ML) 0.083% IN NEBU: 2.5000 mg | INHALATION_SOLUTION | Freq: Four times a day (QID) | RESPIRATORY_TRACT | Status: DC | PRN

## 2014-07-03 MED ORDER — FLUCONAZOLE 150 MG PO TABS: ORAL_TABLET | ORAL | Status: DC

## 2014-07-03 MED ORDER — AZITHROMYCIN 250 MG PO TABS: ORAL_TABLET | ORAL | Status: DC

## 2014-07-03 NOTE — Assessment & Plan Note (Signed)
Plan-discussed and refilled ipratropium nasal spray

## 2014-07-03 NOTE — Assessment & Plan Note (Signed)
She is typically very slow to resolve acute exacerbations. We think this reflects her autoimmune problems. We discussed low-dose maintenance prednisone and a trial of maintenance antibiotic. Plan-prednisone 5-10 mg daily and Zithromax 3 days per week maintenance. Refilled nebulizer solution. Refill cough syrup.

## 2014-07-03 NOTE — Patient Instructions (Addendum)
We are going to try maintenance zithromax three days per week to see if we can suppress bronchitis.  We are extending prednisone a little longer  Other meds are refilled  Ok to keep appointment as scheduled

## 2014-07-03 NOTE — Progress Notes (Signed)
Subjective:    [name], female    DOB: Apr 27, 1946, 68 y.o.   MRN: [id number]  HPI 02/03/11- 4 yoF never smoker followed for allergic rhinitis, rhinosinusitis, asthmatic bronchitis, complicated by , GERD, rheumatoid arthritis. Last here August, 3, 2011 She has has done much better since last here- maybe because she is past the turmoil of moving last year. Not needing her nebulizer recently. Rescue inhaler used less than once/ week. Talked about the death of her aged mother.  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   02/06/12- 64 yoF followed for allergic rhinitis, rhinosinusitis, asthmatic bronchitis, complicated by , GERD, rheumatoid arthritis   Follows for: takes vaccine and has some sinus drainage when she eats.  She has responded a couple of x2 a Z-Pak when needed. Overall much better since she moved to her new home 2 years ago. Uses her rescue inhaler at bedtime. Less nighttime cough she lies on her side. 5 grandchildren stay with her, which is a stress and the source of colds. Watery rhinorrhea when she is.Troches have worked well for yeast prn.   03/28/2012 Acute OV  Complains of prod cough with w/ green/yellow/white mucus, wheezing, increased SOB, chills x2.5weeks, worse x1day.  OTC not helping .  Cough is keeping her up at night.  No hemoptysis or chest pian .  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  Never smoker.   09/20/12- 66 yoF followed for allergic rhinitis, rhinosinusitis, asthmatic bronchitis, complicated by , GERD/ Crohn's, rheumatoid arthritis  Husband here. FOLLOWS FOR: still having headaches, sinus drainage, and dizzy. Was given Augmentin and cough syrup last week. Had fallen in shower in August 2013-had a hematoma and no broken bones. Now- increased SOB/ DOE x 3 weeks. Dry cough at night. Chronic globus. Mucus plug from nose.  CXR 09/10/12-  IMPRESSION:  No acute findings.  Original Report Authenticated By: Lorin Picket, M.D.  11/08/12- 66 yoF followed for allergic rhinitis, rhinosinusitis, asthmatic bronchitis, complicated by , GERD/ Crohn's, rheumatoid arthritis  Husband here. FOLLOWS JQD:UKRCV on allergy vaccine1:10 GO and doing well; no reactions. Has had flare up with allergies; for about 3 weeks now. Never really cleared completely after exacerbation at last visit when we gave cefdinir.Marland Kitchen Has had increasing cough with scant yellow green sputum starting about a week ago. Denies fever or sore throat. Occasional wheeze. Increased ankle edema. Dr. Arelia Sneddon increased her Lasix. She had fallen on ice during the winter and badly skinned her right pretibial area and left knee. We talked about being seen at a wound center. She is to see a neurologist about her falls. She complains of always feeling very tired and husband says she snores. I educated them on watching for sleep apnea. CXR 09/10/12 IMPRESSION:  No acute findings.  Original Report Authenticated By: Lorin Picket, M.D.  06/11/13-   05/10/13- 67 yoF followed for allergic rhinitis, rhinosinusitis, asthmatic bronchitis, complicated by , GERD/ Crohn's, rheumatoid arthritis  Husband here. Allergy vaccine1:10 GO FOLLOWS FOR:  Sinus pressure w/ PND and cough w/ mucus green in color x2 months.  Also, having abdominal pain and frequent urination Had flu vaccine. Describes 2 months increased sinus drainage with some green mucus, frontal headaches. No fever, ear pressure or wheezing. Still recovering from abrasion to legs when she fell last winter. Has been going to wound Center.  05/10/13- 67 yoF followed for allergic rhinitis, rhinosinusitis, asthmatic bronchitis, complicated by , GERD/ Crohn's, rheumatoid arthritis  Husband here.  Allergy vaccine1:10 GO  FOLLOWS FOR: Sinus pressure w/ PND and cough w/ mucus green in color x2 months. Also, having abdominal pain and frequent urination  Had flu vaccine. Describes 2 months increased sinus drainage with some  green mucus, frontal headaches. No fever, ear pressure or wheezing.  Still recovering from abrasion to legs when she fell last winter. Has been going to wound Center.  06/11/13- 67 yoF followed for allergic rhinitis, rhinosinusitis, asthmatic bronchitis, complicated by , GERD/ Crohn's, rheumatoid arthritis   c/o sinus congestion x 1 wk. feels like fluid in head and ears,no ear pain,coughing at night-yellow and green,feels like fever today,nasal  congestion -clear This began with an upper respiratory infection but has become focused in her head She continues allergy vaccine 1: 10 GO  07/31/13 Acute OV  Complains of prod cough with green/yellow mucus, increased SOB, wheezing, fever, body aches.  began Augmentin and steroids on 1/13, fever began last night.  symptoms worse last night.  Patient says she was seen by her family doctor on January 12 and recommend to start on a prednisone taper. She was also called in Augmentin and Phenergan With Codeine cough syrup, which she began yesterday. She complains of a productive cough with thick, yellow green mucus. She's had fever that began last night. MAXIMUM TEMPERATURE 101.5. She also has body aches. She did have the flu shot. Patient's husband has had similar symptoms, but milder She denies any nausea, vomiting, diarrhea, abdominal pain, rash, recent travel Patient is eating, but has a decreased appetite. She says that Phenergan, codeine cough syrup is not helping. She is coughing nonstop. She denies any frank, hemoptysis, calf pain, orthopnea, or edema. Did notice a speck of blood. This morning, when she coughed very hard. >refer to ER for admit for bilateral PNA   08/13/2013 [facility] follow up  Returns for a post hospital follow up . Was admitted for bilateral PNA. Reports is much improved since discharge.  Finished abx 2 days ago, still taking pred taper..  CXR today shows near complete resolution of bilateral aspdz. She is feeling much better.   Still has some weakness.  No fever, chest pain or edema.  Appetite is returning. No n/v//d.   08/30/13- 67 yoF followed for allergic rhinitis, rhinosinusitis, asthmatic bronchitis, complicated by , GERD/ Crohn's, rheumatoid arthritis   Husband here FOLLOWS FOR:  Increased sob, wheezing and cough with green mucus x3 days Acute visit, for 5 days of new cold after resolving bronchitis exacerbation a few weeks ago. Exposed to grandchildren who were sick. Immunosuppressed by her rheumatoid arthritis and Crohn's disease. Increased dry cough, throat uncomfortable, low-grade fever, nasal congestion with yellow/green mucus CXR 08/13/13 IMPRESSION:  Near-complete resolution of left effusion and basilar airspace  disease. No new abnormality.  Electronically Signed  By: Inge Rise M.D.  On: 08/13/2013 11:21  09/05/13- 76 yoF followed for allergic rhinitis, rhinosinusitis, asthmatic bronchitis, complicated by  GERD/ Crohn's, rheumatoid arthritis   Husband here ACUTE  VISIT: patient having low O2 levels, SOB, and fatigued-just wants to sleep. Not eating well at all. She had come off of prednisone. Glucose was 144. She denies fever , purulent sputum, chest pain. >>BREO rx   10/14/13 Acute OV - TP Complains of persistent symptoms.  Complains that she feels her breathing is getting worse. Wears out breath easily . Last ov with persistent desats, given BREO  . Did not feel this helped much at all.  Complains of prod cough with dark green  mucus, increased SOB, chest tightness x1 week.  Denies any f/c/s, nausea, vomiting, hemoptysis.  sats were 86% RA upon entering exam room.   Was called Doxycycline last week, finished on 3/27 Congestion is better but still having DOE . Slow to resolve flare now w/ persistent exertional hypoxemia  Check CXR today  Consider PFTs in future  Plan  Prednisone taper over next week  Chest xray today .  Begin Oxygen 2l/m with activity and At bedtime  Follow up Dr. Annamaria Boots  In 3-4 weeks and As needed  Please contact office for sooner follow up if symptoms do not improve or worsen or seek emergency care   11/12/13- 67 yoF followed for allergic rhinitis, rhinosinusitis, asthmatic bronchitis, complicated by  GERD/ Crohn's, rheumatoid arthritis   Husband here FOLLOWS FOR:  Cough has improved.  Now on 2 - 3 L O2 / [employer] sleep/ exertion. Needed oxygen after last visit, used for sleep and as needed. Green sputum has now cleared. Treating postnasal drip with Claritin and Mucinex DM. Continues allergy vaccine 1:10 GO CXR 10/14/13 IMPRESSION:  Stable chronic lung disease with bibasilar scarring/atelectasis.  Electronically Signed  By: Aletta Edouard M.D.  On: 10/14/2013 15:50  03/10/14-67 yoF followed for allergic rhinitis, rhinosinusitis, asthmatic bronchitis, complicated by  GERD/ Crohn's, rheumatoid arthritis   Husband here 4 month follow up.  Still has prod cough with clear to yellow mucus in the mornings and qhs.  DOE is unchanged. She complains that since pneumonia in January, she has had a foreign body sensation of "something hanging back there" behind her soft palate.no headache and not blowing much from her nose. No sore throat. She describes how a chiropractor felt something against her forehead and she blew out a small nodular and reticular structure she compares to  "grapes and leaves", with no change in how her nose or throat felt. She has been comfortable since stopping oxygen once she recovered from the pneumonia.   03/27/2014 Acute OV  (Followed for allergic rhinitis, rhinosinusitis, asthmatic bronchitis, complicated by  GERD/ Crohn's, OA ) Complains of  Productive cough, congestion w/ thick yellow and green mucus for 2 weeks . Marland Kitchen Has sore throat.  Was seen by ENT recently , told possible ? GERD, rx Prilosec 67m  . Still has sore throat.  Started taking some left over augmentin few days ago. Has not helped. Last dose of aumgentin this am.  More wheezing  and cough for last few days.  Complains of stress incontinence with coughing .  No fever, hemoptysis, chest pain, orthopnea, edema or n/v/d.  Wants refill of tussionex , cough is keeping her up at night .  Requests Diflucan rx as she gets frequent yeast infections on abx.   05/15/14- 67 yoF followed for allergic rhinitis, rhinosinusitis, asthmatic bronchitis, complicated by  GERD/ Crohn's, rheumatoid arthritis   Husband here FOLLOWS FOR: patient feels like something in her throat.   06/10/14- 89yoF followed for allergic rhinitis, rhinosinusitis, asthmatic bronchitis, complicated by  GERD/ Crohn's, rheumatoid arthritis    FOLLOWS FOR: wheezing, cough-yellow in color, SOB as well. ? fever as well. We sent Zpak and cough syrup 6 days ago. Continues allergy vaccine 1:10 GO  07/03/14-68 yoF followed for allergic rhinitis, rhinosinusitis, asthmatic bronchitis, complicated by  GERD/ Crohn's, rheumatoid arthritis Follows For: Finished Prednisone 2 days ago  - Still c/o prod cough ( green), some sob and wheezing,        husband here Still having some green sputum from chest and  may be from nose. Just finished prednisone 2 days ago. We discussed available tools. Husband thinks she is somewhat better. They understand her autoimmune problems with Crohn's disease and rheumatoid arthritis impact her resistance to infection. CXR 06/10/14 IMPRESSION: Stable chronic bronchitic changes. There is no evidence of pneumonia nor CHF. Electronically Signed  By: David Martinique  On: 06/10/2014 14:09   ROS-see HPI Constitutional:   No-   weight loss, night sweats, fevers, chills, fatigue, lassitude. HEENT:   No-  headaches, difficulty swallowing, tooth/dental problems, sore throat,       No-  sneezing, itching, ear ache, nasal congestion, +post nasal drip,  CV:  No-   chest pain, orthopnea, PND, swelling in lower extremities, anasarca,                                  dizziness, palpitations Resp: No-    shortness of breath with exertion or at rest.              +  productive cough,  + non-productive cough,  No- coughing up of blood.             +   change in color of mucus.  No- wheezing.   Skin: No-   rash or lesions. GI:  No-   heartburn, indigestion, abdominal pain, nausea, vomiting,  GU:  MS:  No-   joint pain or swelling.   Neuro-     nothing unusual Psych:  No- change in mood or affect. No depression or anxiety.  No memory loss.  Objective:  OBJ- Physical Exam  General- Alert, Oriented, Affect-appropriate, Distress- none acute Skin- rash-none, lesions- none, excoriation- none Lymphadenopathy- none Head- atraumatic            Eyes- Gross vision intact, PERRLA, conjunctivae and secretions clear            Ears- Hearing, canals-normal            Nose- Clear, no-Septal dev, mucus, polyps, erosion, perforation             Throat- Mallampati II , mucosa-clear, drainage- none, tonsils-                       atrophic, voice normal Neck- flexible , trachea midline, no stridor , thyroid nl, carotid no bruit Chest - symmetrical excursion , unlabored           Heart/CV- RRR , no murmur , no gallop  , no rub, nl s1 s2                           - JVD- none , edema- none, stasis changes- none, varices- none           Lung-  +Deep barking cough, wheezing -none, clear to quiet auscultation           Chest wall-  Abd-  Br/ Gen/ Rectal- Not done, not indicated Extrem- +scar from treated wound Neuro- grossly intact to observation

## 2014-08-20 DIAGNOSIS — J40 Bronchitis, not specified as acute or chronic: Secondary | ICD-10-CM | POA: Diagnosis not present

## 2014-08-20 DIAGNOSIS — Z7952 Long term (current) use of systemic steroids: Secondary | ICD-10-CM | POA: Diagnosis not present

## 2014-08-20 DIAGNOSIS — M19042 Primary osteoarthritis, left hand: Secondary | ICD-10-CM | POA: Diagnosis not present

## 2014-08-20 DIAGNOSIS — M19041 Primary osteoarthritis, right hand: Secondary | ICD-10-CM | POA: Diagnosis not present

## 2014-08-20 DIAGNOSIS — M7532 Calcific tendinitis of left shoulder: Secondary | ICD-10-CM | POA: Diagnosis not present

## 2014-08-20 DIAGNOSIS — M25471 Effusion, right ankle: Secondary | ICD-10-CM | POA: Diagnosis not present

## 2014-08-20 DIAGNOSIS — M25472 Effusion, left ankle: Secondary | ICD-10-CM | POA: Diagnosis not present

## 2014-09-03 DIAGNOSIS — M19012 Primary osteoarthritis, left shoulder: Secondary | ICD-10-CM | POA: Diagnosis not present

## 2014-09-10 ENCOUNTER — Encounter: Payer: Self-pay | Admitting: Internal Medicine

## 2014-09-10 ENCOUNTER — Ambulatory Visit (INDEPENDENT_AMBULATORY_CARE_PROVIDER_SITE_OTHER): Payer: Medicare Other | Admitting: Internal Medicine

## 2014-09-10 VITALS — BP 122/72 | HR 86 | Ht 62.0 in | Wt 173.0 lb

## 2014-09-10 DIAGNOSIS — J441 Chronic obstructive pulmonary disease with (acute) exacerbation: Secondary | ICD-10-CM | POA: Diagnosis not present

## 2014-09-10 DIAGNOSIS — J302 Other seasonal allergic rhinitis: Secondary | ICD-10-CM

## 2014-09-10 DIAGNOSIS — J309 Allergic rhinitis, unspecified: Secondary | ICD-10-CM

## 2014-09-10 DIAGNOSIS — J3089 Other allergic rhinitis: Secondary | ICD-10-CM

## 2014-09-10 MED ORDER — AZITHROMYCIN 250 MG PO TABS: ORAL_TABLET | ORAL | Status: DC

## 2014-09-10 NOTE — Progress Notes (Signed)
Subjective:    [name], female    DOB: Apr 27, 1946, 69 y.o.   MRN: [id number]  HPI 02/03/11- 4 yoF never smoker followed for allergic rhinitis, rhinosinusitis, asthmatic bronchitis, complicated by , GERD, rheumatoid arthritis. Last here August, 3, 2011 She has has done much better since last here- maybe because she is past the turmoil of moving last year. Not needing her nebulizer recently. Rescue inhaler used less than once/ week. Talked about the death of her aged mother.  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   02/06/12- 64 yoF followed for allergic rhinitis, rhinosinusitis, asthmatic bronchitis, complicated by , GERD, rheumatoid arthritis   Follows for: takes vaccine and has some sinus drainage when she eats.  She has responded a couple of x2 a Z-Pak when needed. Overall much better since she moved to her new home 2 years ago. Uses her rescue inhaler at bedtime. Less nighttime cough she lies on her side. 5 grandchildren stay with her, which is a stress and the source of colds. Watery rhinorrhea when she is.Troches have worked well for yeast prn.   03/28/2012 Acute OV  Complains of prod cough with w/ green/yellow/white mucus, wheezing, increased SOB, chills x2.5weeks, worse x1day.  OTC not helping .  Cough is keeping her up at night.  No hemoptysis or chest pian .  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  Never smoker.   09/20/12- 66 yoF followed for allergic rhinitis, rhinosinusitis, asthmatic bronchitis, complicated by , GERD/ Crohn's, rheumatoid arthritis  Husband here. FOLLOWS FOR: still having headaches, sinus drainage, and dizzy. Was given Augmentin and cough syrup last week. Had fallen in shower in August 2013-had a hematoma and no broken bones. Now- increased SOB/ DOE x 3 weeks. Dry cough at night. Chronic globus. Mucus plug from nose.  CXR 09/10/12-  IMPRESSION:  No acute findings.  Original Report Authenticated By: Lorin Picket, M.D.  11/08/12- 66 yoF followed for allergic rhinitis, rhinosinusitis, asthmatic bronchitis, complicated by , GERD/ Crohn's, rheumatoid arthritis  Husband here. FOLLOWS JQD:UKRCV on allergy vaccine1:10 GO and doing well; no reactions. Has had flare up with allergies; for about 3 weeks now. Never really cleared completely after exacerbation at last visit when we gave cefdinir.Marland Kitchen Has had increasing cough with scant yellow green sputum starting about a week ago. Denies fever or sore throat. Occasional wheeze. Increased ankle edema. Dr. Arelia Sneddon increased her Lasix. She had fallen on ice during the winter and badly skinned her right pretibial area and left knee. We talked about being seen at a wound center. She is to see a neurologist about her falls. She complains of always feeling very tired and husband says she snores. I educated them on watching for sleep apnea. CXR 09/10/12 IMPRESSION:  No acute findings.  Original Report Authenticated By: Lorin Picket, M.D.  06/11/13-   05/10/13- 67 yoF followed for allergic rhinitis, rhinosinusitis, asthmatic bronchitis, complicated by , GERD/ Crohn's, rheumatoid arthritis  Husband here. Allergy vaccine1:10 GO FOLLOWS FOR:  Sinus pressure w/ PND and cough w/ mucus green in color x2 months.  Also, having abdominal pain and frequent urination Had flu vaccine. Describes 2 months increased sinus drainage with some green mucus, frontal headaches. No fever, ear pressure or wheezing. Still recovering from abrasion to legs when she fell last winter. Has been going to wound Center.  05/10/13- 67 yoF followed for allergic rhinitis, rhinosinusitis, asthmatic bronchitis, complicated by , GERD/ Crohn's, rheumatoid arthritis  Husband here.  Allergy vaccine1:10 GO  FOLLOWS FOR: Sinus pressure w/ PND and cough w/ mucus green in color x2 months. Also, having abdominal pain and frequent urination  Had flu vaccine. Describes 2 months increased sinus drainage with some  green mucus, frontal headaches. No fever, ear pressure or wheezing.  Still recovering from abrasion to legs when she fell last winter. Has been going to wound Center.  06/11/13- 67 yoF followed for allergic rhinitis, rhinosinusitis, asthmatic bronchitis, complicated by , GERD/ Crohn's, rheumatoid arthritis   c/o sinus congestion x 1 wk. feels like fluid in head and ears,no ear pain,coughing at night-yellow and green,feels like fever today,nasal  congestion -clear This began with an upper respiratory infection but has become focused in her head She continues allergy vaccine 1: 10 GO  07/31/13 Acute OV  Complains of prod cough with green/yellow mucus, increased SOB, wheezing, fever, body aches.  began Augmentin and steroids on 1/13, fever began last night.  symptoms worse last night.  Patient says she was seen by her family doctor on January 12 and recommend to start on a prednisone taper. She was also called in Augmentin and Phenergan With Codeine cough syrup, which she began yesterday. She complains of a productive cough with thick, yellow green mucus. She's had fever that began last night. MAXIMUM TEMPERATURE 101.5. She also has body aches. She did have the flu shot. Patient's husband has had similar symptoms, but milder She denies any nausea, vomiting, diarrhea, abdominal pain, rash, recent travel Patient is eating, but has a decreased appetite. She says that Phenergan, codeine cough syrup is not helping. She is coughing nonstop. She denies any frank, hemoptysis, calf pain, orthopnea, or edema. Did notice a speck of blood. This morning, when she coughed very hard. >refer to ER for admit for bilateral PNA   08/13/2013 [facility] follow up  Returns for a post hospital follow up . Was admitted for bilateral PNA. Reports is much improved since discharge.  Finished abx 2 days ago, still taking pred taper..  CXR today shows near complete resolution of bilateral aspdz. She is feeling much better.   Still has some weakness.  No fever, chest pain or edema.  Appetite is returning. No n/v//d.   08/30/13- 67 yoF followed for allergic rhinitis, rhinosinusitis, asthmatic bronchitis, complicated by , GERD/ Crohn's, rheumatoid arthritis   Husband here FOLLOWS FOR:  Increased sob, wheezing and cough with green mucus x3 days Acute visit, for 5 days of new cold after resolving bronchitis exacerbation a few weeks ago. Exposed to grandchildren who were sick. Immunosuppressed by her rheumatoid arthritis and Crohn's disease. Increased dry cough, throat uncomfortable, low-grade fever, nasal congestion with yellow/green mucus CXR 08/13/13 IMPRESSION:  Near-complete resolution of left effusion and basilar airspace  disease. No new abnormality.  Electronically Signed  By: Inge Rise M.D.  On: 08/13/2013 11:21  09/05/13- 76 yoF followed for allergic rhinitis, rhinosinusitis, asthmatic bronchitis, complicated by  GERD/ Crohn's, rheumatoid arthritis   Husband here ACUTE  VISIT: patient having low O2 levels, SOB, and fatigued-just wants to sleep. Not eating well at all. She had come off of prednisone. Glucose was 144. She denies fever , purulent sputum, chest pain. >>BREO rx   10/14/13 Acute OV - TP Complains of persistent symptoms.  Complains that she feels her breathing is getting worse. Wears out breath easily . Last ov with persistent desats, given BREO  . Did not feel this helped much at all.  Complains of prod cough with dark green  mucus, increased SOB, chest tightness x1 week.  Denies any f/c/s, nausea, vomiting, hemoptysis.  sats were 86% RA upon entering exam room.   Was called Doxycycline last week, finished on 3/27 Congestion is better but still having DOE . Slow to resolve flare now w/ persistent exertional hypoxemia  Check CXR today  Consider PFTs in future  Plan  Prednisone taper over next week  Chest xray today .  Begin Oxygen 2l/m with activity and At bedtime  Follow up Dr. Annamaria Boots  In 3-4 weeks and As needed  Please contact office for sooner follow up if symptoms do not improve or worsen or seek emergency care   11/12/13- 67 yoF followed for allergic rhinitis, rhinosinusitis, asthmatic bronchitis, complicated by  GERD/ Crohn's, rheumatoid arthritis   Husband here FOLLOWS FOR:  Cough has improved.  Now on 2 - 3 L O2 / [employer] sleep/ exertion. Needed oxygen after last visit, used for sleep and as needed. Green sputum has now cleared. Treating postnasal drip with Claritin and Mucinex DM. Continues allergy vaccine 1:10 GO CXR 10/14/13 IMPRESSION:  Stable chronic lung disease with bibasilar scarring/atelectasis.  Electronically Signed  By: Aletta Edouard M.D.  On: 10/14/2013 15:50  03/10/14-67 yoF followed for allergic rhinitis, rhinosinusitis, asthmatic bronchitis, complicated by  GERD/ Crohn's, rheumatoid arthritis   Husband here 4 month follow up.  Still has prod cough with clear to yellow mucus in the mornings and qhs.  DOE is unchanged. She complains that since pneumonia in January, she has had a foreign body sensation of "something hanging back there" behind her soft palate.no headache and not blowing much from her nose. No sore throat. She describes how a chiropractor felt something against her forehead and she blew out a small nodular and reticular structure she compares to  "grapes and leaves", with no change in how her nose or throat felt. She has been comfortable since stopping oxygen once she recovered from the pneumonia.   03/27/2014 Acute OV  (Followed for allergic rhinitis, rhinosinusitis, asthmatic bronchitis, complicated by  GERD/ Crohn's, OA ) Complains of  Productive cough, congestion w/ thick yellow and green mucus for 2 weeks . Marland Kitchen Has sore throat.  Was seen by ENT recently , told possible ? GERD, rx Prilosec 67m  . Still has sore throat.  Started taking some left over augmentin few days ago. Has not helped. Last dose of aumgentin this am.  More wheezing  and cough for last few days.  Complains of stress incontinence with coughing .  No fever, hemoptysis, chest pain, orthopnea, edema or n/v/d.  Wants refill of tussionex , cough is keeping her up at night .  Requests Diflucan rx as she gets frequent yeast infections on abx.   05/15/14- 67 yoF followed for allergic rhinitis, rhinosinusitis, asthmatic bronchitis, complicated by  GERD/ Crohn's, rheumatoid arthritis   Husband here FOLLOWS FOR: patient feels like something in her throat.   06/10/14- 89yoF followed for allergic rhinitis, rhinosinusitis, asthmatic bronchitis, complicated by  GERD/ Crohn's, rheumatoid arthritis    FOLLOWS FOR: wheezing, cough-yellow in color, SOB as well. ? fever as well. We sent Zpak and cough syrup 6 days ago. Continues allergy vaccine 1:10 GO  07/03/14-68 yoF followed for allergic rhinitis, rhinosinusitis, asthmatic bronchitis, complicated by  GERD/ Crohn's, rheumatoid arthritis Follows For: Finished Prednisone 2 days ago  - Still c/o prod cough ( green), some sob and wheezing,        husband here Still having some green sputum from chest and  may be from nose. Just finished prednisone 2 days ago. We discussed available tools. Husband thinks she is somewhat better. They understand her autoimmune problems with Crohn's disease and rheumatoid arthritis impact her resistance to infection. CXR 06/10/14 IMPRESSION: Stable chronic bronchitic changes. There is no evidence of pneumonia nor CHF. Electronically Signed  By: David Martinique  On: 06/10/2014 14:09  09/10/14- 53 yoF followed for allergic rhinitis, rhinosinusitis, asthmatic bronchitis, complicated by  GERD/ Crohn's, rheumatoid arthritis  husband here FOLLOWS FOR: Pt states she continues to have "clogged" feeling in her throat-patient started taking her Zithromax QD instead of M,W, F as Rx'd-felt this has helped clear things up as well. Patient has had 2 cortisone injections in her shoulder this month. That may be  why she is doing quite well. Took her Zithromax daily for acute illness, ending 1 week ago. Diet program-lost 10 pounds  ROS-see HPI Constitutional:   No-   weight loss, night sweats, fevers, chills, fatigue, lassitude. HEENT:   No-  headaches, difficulty swallowing, tooth/dental problems, sore throat,       No-  sneezing, itching, ear ache, nasal congestion, +post nasal drip,  CV:  No-   chest pain, orthopnea, PND, swelling in lower extremities, anasarca,                                  dizziness, palpitations Resp: No-   shortness of breath with exertion or at rest.              +  productive cough,  + non-productive cough,  No- coughing up of blood.             +   change in color of mucus.  No- wheezing.   Skin: No-   rash or lesions. GI:  No-   heartburn, indigestion, abdominal pain, nausea, vomiting,  GU:  MS:  No-   joint pain or swelling.   Neuro-     nothing unusual Psych:  No- change in mood or affect. No depression or anxiety.  No memory loss.  Objective:  OBJ- Physical Exam  General- Alert, Oriented, Affect-appropriate, Distress- none acute Skin- rash-none, lesions- none, excoriation- none Lymphadenopathy- none Head- atraumatic            Eyes- Gross vision intact, PERRLA, conjunctivae and secretions clear            Ears- Hearing, canals-normal            Nose- Clear, no-Septal dev, mucus, polyps, erosion, perforation             Throat- Mallampati II , mucosa-clear, drainage- none, tonsils-                       atrophic, voice normal, +question light thrush Neck- flexible , trachea midline, no stridor , thyroid nl, carotid no bruit Chest - symmetrical excursion , unlabored           Heart/CV- RRR , no murmur , no gallop  , no rub, nl s1 s2                           - JVD- none , edema- none, stasis changes- none, varices- none           Lung-   cough-none, wheezing -none, clear to quiet auscultation  Chest wall-  Abd-  Br/ Gen/ Rectal- Not done, not  indicated Extrem- +scar from treated wound Neuro- grossly intact to observation

## 2014-09-10 NOTE — Patient Instructions (Signed)
Script printed for maintenance zithromax to resume three days per week if needed.  Consider trying a little cranberry juice to clear your throat.  Please call as needed

## 2014-09-10 NOTE — Assessment & Plan Note (Signed)
She finished Zithromax a week ago. She believes it was significant help avoiding exacerbations this winter. I would like her to try without it for a while if she can. Plan-standby prescription to resume maintenance prophylactic Zithromax 250 mg 3 days per week, if she needs to.

## 2014-09-10 NOTE — Assessment & Plan Note (Signed)
Globus sensation. She might have a very little thrush. I suggest gargling for a day or 2 to see if this clears.

## 2014-09-11 ENCOUNTER — Other Ambulatory Visit: Payer: Self-pay | Admitting: Internal Medicine

## 2014-10-01 DIAGNOSIS — M19012 Primary osteoarthritis, left shoulder: Secondary | ICD-10-CM | POA: Diagnosis not present

## 2014-10-06 DIAGNOSIS — H26491 Other secondary cataract, right eye: Secondary | ICD-10-CM | POA: Diagnosis not present

## 2014-10-20 DIAGNOSIS — H26491 Other secondary cataract, right eye: Secondary | ICD-10-CM | POA: Diagnosis not present

## 2014-10-22 DIAGNOSIS — N958 Other specified menopausal and perimenopausal disorders: Secondary | ICD-10-CM | POA: Diagnosis not present

## 2014-10-22 DIAGNOSIS — M8588 Other specified disorders of bone density and structure, other site: Secondary | ICD-10-CM | POA: Diagnosis not present

## 2014-10-27 ENCOUNTER — Ambulatory Visit (INDEPENDENT_AMBULATORY_CARE_PROVIDER_SITE_OTHER): Payer: Medicare Other

## 2014-10-27 DIAGNOSIS — J309 Allergic rhinitis, unspecified: Secondary | ICD-10-CM | POA: Diagnosis not present

## 2014-11-28 DIAGNOSIS — K219 Gastro-esophageal reflux disease without esophagitis: Secondary | ICD-10-CM | POA: Diagnosis not present

## 2014-11-28 DIAGNOSIS — A09 Infectious gastroenteritis and colitis, unspecified: Secondary | ICD-10-CM | POA: Diagnosis not present

## 2014-12-13 ENCOUNTER — Other Ambulatory Visit: Payer: Self-pay | Admitting: Internal Medicine

## 2014-12-17 DIAGNOSIS — M19042 Primary osteoarthritis, left hand: Secondary | ICD-10-CM | POA: Diagnosis not present

## 2014-12-17 DIAGNOSIS — M19041 Primary osteoarthritis, right hand: Secondary | ICD-10-CM | POA: Diagnosis not present

## 2014-12-17 DIAGNOSIS — M7532 Calcific tendinitis of left shoulder: Secondary | ICD-10-CM | POA: Diagnosis not present

## 2014-12-17 DIAGNOSIS — R05 Cough: Secondary | ICD-10-CM | POA: Diagnosis not present

## 2015-01-01 DIAGNOSIS — E785 Hyperlipidemia, unspecified: Secondary | ICD-10-CM | POA: Diagnosis not present

## 2015-01-01 DIAGNOSIS — Z79899 Other long term (current) drug therapy: Secondary | ICD-10-CM | POA: Diagnosis not present

## 2015-01-01 DIAGNOSIS — M899 Disorder of bone, unspecified: Secondary | ICD-10-CM | POA: Diagnosis not present

## 2015-01-01 DIAGNOSIS — I10 Essential (primary) hypertension: Secondary | ICD-10-CM | POA: Diagnosis not present

## 2015-01-01 DIAGNOSIS — Z0000 Encounter for general adult medical examination without abnormal findings: Secondary | ICD-10-CM | POA: Diagnosis not present

## 2015-01-12 ENCOUNTER — Other Ambulatory Visit: Payer: Self-pay | Admitting: Internal Medicine

## 2015-01-12 ENCOUNTER — Other Ambulatory Visit: Payer: Self-pay

## 2015-01-28 DIAGNOSIS — M19012 Primary osteoarthritis, left shoulder: Secondary | ICD-10-CM | POA: Diagnosis not present

## 2015-02-24 ENCOUNTER — Telehealth: Payer: Self-pay | Admitting: Internal Medicine

## 2015-02-24 ENCOUNTER — Ambulatory Visit (INDEPENDENT_AMBULATORY_CARE_PROVIDER_SITE_OTHER): Payer: Medicare Other

## 2015-02-24 DIAGNOSIS — J309 Allergic rhinitis, unspecified: Secondary | ICD-10-CM

## 2015-02-25 NOTE — Telephone Encounter (Signed)
Date Mixed: 02/24/15 Vial: 1 Strength: 1:10 Here/Mail/Pick Up: mail Mixed By: Laurette Schimke

## 2015-03-06 ENCOUNTER — Encounter: Payer: Self-pay | Admitting: Internal Medicine

## 2015-03-16 ENCOUNTER — Telehealth: Payer: Self-pay | Admitting: Internal Medicine

## 2015-03-16 MED ORDER — DOXYCYCLINE HYCLATE 100 MG PO TABS: ORAL_TABLET | ORAL | Status: DC

## 2015-03-16 NOTE — Telephone Encounter (Signed)
Called spoke with pt. Pt is leaving for [facility] Friday and will be gone x 2 weeks. 1) c/o prod cough (yellow phlem), sore throat x 2-3 days. She has azithromycin she is suppose to take on M,W,F. Pt decided to use the RX as if she was taking a ZPAK anod now won't have enough to get her through her trip. She called the pharm and insurance states she has requested a refill too soon. Pt asking what to do and what else can be called in? 2) she needs a letter from Dr. Annamaria Boots stating she can take her allergy vaccine on the trip (letter needs to state the type of vaccine she is on and how often she takes it) 3) she needs an RX sent to the pharm for syringes.  Please advise Dr. Annamaria Boots thanks  Allergies  Allergen Reactions  . Lactose Intolerance (Gi) Diarrhea  . Nitrofuran Derivatives     shakiness  . Other Diarrhea    "lettuce only"  . Tdap [Diphth-Acell Pertussis-Tetanus]     Shaking uncontrollably  . Erythromycin Other (See Comments)    Diarrhea   . Tetanus Toxoids Other (See Comments)    Shaking uncontrollable       Current Outpatient Prescriptions on File Prior to Visit  Medication Sig Dispense Refill  . albuterol (PROAIR HFA) 108 (90 BASE) MCG/ACT inhaler Inhale 2 puffs into the lungs every 4 (four) hours as needed for wheezing or shortness of breath. 1 Inhaler 3  . albuterol (PROVENTIL) (2.5 MG/3ML) 0.083% nebulizer solution Take 3 mLs (2.5 mg total) by nebulization every 6 (six) hours as needed for wheezing or shortness of breath. 75 mL 5  . aspirin EC 81 MG tablet Take 81 mg by mouth daily.    Marland Kitchen azithromycin (ZITHROMAX) 250 MG tablet Maintenance, 3 days per week as directed 25 each 5  . benzonatate (TESSALON) 100 MG capsule TAKE 1 CAPSULE (100 MG TOTAL) BY MOUTH 4 (FOUR) TIMES DAILY AS NEEDED FOR COUGH. 50 capsule 1  . celecoxib (CELEBREX) 200 MG capsule Take 1 capsule by mouth daily.    . colestipol (COLESTID) 1 G tablet Take 1.5 g by mouth daily. Take 1.5 tabs in am and 0.5 tabs at  dinner    . DiphenhydrAMINE HCl, Sleep, (UNISOM SLEEPMELTS PO) Take by mouth at bedtime.    . ergocalciferol (VITAMIN D2) 50000 UNITS capsule Take 50,000 Units by mouth every 14 (fourteen) days.      . fexofenadine (ALLEGRA) 180 MG tablet Take 90 mg by mouth at bedtime.    . fexofenadine-pseudoephedrine (ALLEGRA-D 24) 180-240 MG per 24 hr tablet Take 1 tablet by mouth daily.    . fluconazole (DIFLUCAN) 150 MG tablet 1 every other day 10 tablet 1  . FLUoxetine (PROZAC) 20 MG tablet Take 20 mg by mouth daily.      . fluticasone (FLONASE) 50 MCG/ACT nasal spray Place 2 sprays into the nose daily.     . furosemide (LASIX) 20 MG tablet Take 40 mg by mouth daily.     Marland Kitchen glycopyrrolate (ROBINUL) 1 MG tablet Take 1 mg by mouth 3 (three) times daily as needed (for IBS).     Marland Kitchen guaiFENesin (MUCINEX) 600 MG 12 hr tablet Take 1,200 mg by mouth 2 (two) times daily as needed.    Marland Kitchen ipratropium (ATROVENT) 0.03 % nasal spray USE 1 TO 2 SPRAYS IN EACH NOSTRIL 3 TIMES A DAY AS NEEDED 30 mL 5  . Menthol, Topical Analgesic, (BLUE-EMU MAXIMUM STRENGTH EX) Apply  1 application topically daily as needed (for hip, back pain).    . mirtazapine (REMERON) 15 MG tablet Take 45 mg by mouth at bedtime.     . montelukast (SINGULAIR) 10 MG tablet TAKE 1 TABLET BY MOUTH AT BEDTIME 90 tablet 3  . NONFORMULARY OR COMPOUNDED ITEM Allergy Vaccine 1:10 Given at Home    . omeprazole (PRILOSEC) 40 MG capsule Take 40 mg by mouth daily.    . potassium chloride SA (K-DUR,KLOR-CON) 20 MEQ tablet Take 20 mEq by mouth 3 (three) times daily.    . pravastatin (PRAVACHOL) 40 MG tablet Take 40 mg by mouth daily.     . predniSONE (DELTASONE) 5 MG tablet 1-2 tabs once daily x 10 days 20 tablet 0  . promethazine-codeine (PHENERGAN WITH CODEINE) 6.25-10 MG/5ML syrup Take 5 mLs by mouth every 6 (six) hours as needed for cough. 200 mL 0  . traMADol (ULTRAM) 50 MG tablet Take 50 mg by mouth every 8 (eight) hours as needed for pain.     No current  facility-administered medications on file prior to visit.

## 2015-03-16 NOTE — Telephone Encounter (Signed)
Per CY-lets give patient Doxycycline 100 mg #8 take 2 today then 1 daily with no refills---this will be for her to get filled and take with her to [place].   Please send message back to me to write letter and send Rx for syringes. Thanks.

## 2015-03-16 NOTE — Telephone Encounter (Signed)
Pt aware of recs. RX sent in. Aware we will call once letter is ready for pick up. Will forward back to [facility]

## 2015-03-17 NOTE — Telephone Encounter (Signed)
I had gone over with [name] how to word the letter. Could you please see if she is working on that? Otherwise LMK and I will write it. Thanks.

## 2015-03-17 NOTE — Telephone Encounter (Signed)
CY Please advise how detailed the letter needs to be for patient to take her vaccine to [place] with her. Thanks.

## 2015-03-18 ENCOUNTER — Encounter: Payer: Self-pay | Admitting: *Deleted

## 2015-03-18 MED ORDER — "INSULIN SYRINGE-NEEDLE U-100 25G X 5/8"" 1 ML MISC": Status: DC

## 2015-03-18 NOTE — Telephone Encounter (Signed)
Spoke with the pt and notified letter is not ready and we will call her when it is  [name], please advise thanks!

## 2015-03-18 NOTE — Telephone Encounter (Signed)
Pt calling back abiut letter says today is the last day that she can pick this up, she can be reached [phone].Hillery Hunter

## 2015-03-18 NOTE — Telephone Encounter (Signed)
Please let patient know her letter is ready for pick up and her RX for allergy needles have been sent to her pharmacy listed. Thanks.

## 2015-03-18 NOTE — Telephone Encounter (Signed)
Spoke with the pt and notified of recs per KW She verbalized understanding  Nothing further needed

## 2015-04-22 DIAGNOSIS — Z79899 Other long term (current) drug therapy: Secondary | ICD-10-CM | POA: Diagnosis not present

## 2015-04-22 DIAGNOSIS — M25476 Effusion, unspecified foot: Secondary | ICD-10-CM | POA: Diagnosis not present

## 2015-04-22 DIAGNOSIS — M130 Polyarthritis, unspecified: Secondary | ICD-10-CM | POA: Diagnosis not present

## 2015-04-22 DIAGNOSIS — M25442 Effusion, left hand: Secondary | ICD-10-CM | POA: Diagnosis not present

## 2015-04-22 DIAGNOSIS — M7532 Calcific tendinitis of left shoulder: Secondary | ICD-10-CM | POA: Diagnosis not present

## 2015-04-22 DIAGNOSIS — M25441 Effusion, right hand: Secondary | ICD-10-CM | POA: Diagnosis not present

## 2015-04-23 DIAGNOSIS — Z23 Encounter for immunization: Secondary | ICD-10-CM | POA: Diagnosis not present

## 2015-05-04 DIAGNOSIS — M19012 Primary osteoarthritis, left shoulder: Secondary | ICD-10-CM | POA: Diagnosis not present

## 2015-05-12 ENCOUNTER — Other Ambulatory Visit: Payer: Self-pay | Admitting: *Deleted

## 2015-05-12 MED ORDER — IPRATROPIUM BROMIDE 0.03 % NA SOLN: 1.0000 | Freq: Two times a day (BID) | NASAL | Status: DC

## 2015-05-12 MED ORDER — MONTELUKAST SODIUM 10 MG PO TABS: 10.0000 mg | ORAL_TABLET | Freq: Every day | ORAL | Status: DC

## 2015-05-14 DIAGNOSIS — M79671 Pain in right foot: Secondary | ICD-10-CM | POA: Diagnosis not present

## 2015-05-14 DIAGNOSIS — M25441 Effusion, right hand: Secondary | ICD-10-CM | POA: Diagnosis not present

## 2015-05-14 DIAGNOSIS — M0609 Rheumatoid arthritis without rheumatoid factor, multiple sites: Secondary | ICD-10-CM | POA: Diagnosis not present

## 2015-05-14 DIAGNOSIS — M79672 Pain in left foot: Secondary | ICD-10-CM | POA: Diagnosis not present

## 2015-05-14 DIAGNOSIS — M25442 Effusion, left hand: Secondary | ICD-10-CM | POA: Diagnosis not present

## 2015-05-14 DIAGNOSIS — Z79899 Other long term (current) drug therapy: Secondary | ICD-10-CM | POA: Diagnosis not present

## 2015-05-18 ENCOUNTER — Encounter: Payer: Self-pay | Admitting: Internal Medicine

## 2015-05-18 ENCOUNTER — Ambulatory Visit (INDEPENDENT_AMBULATORY_CARE_PROVIDER_SITE_OTHER): Payer: Medicare Other | Admitting: Internal Medicine

## 2015-05-18 VITALS — BP 116/70 | HR 76 | Ht 62.0 in | Wt 158.6 lb

## 2015-05-18 DIAGNOSIS — J441 Chronic obstructive pulmonary disease with (acute) exacerbation: Secondary | ICD-10-CM | POA: Diagnosis not present

## 2015-05-18 DIAGNOSIS — J3089 Other allergic rhinitis: Secondary | ICD-10-CM

## 2015-05-18 DIAGNOSIS — J309 Allergic rhinitis, unspecified: Secondary | ICD-10-CM | POA: Diagnosis not present

## 2015-05-18 DIAGNOSIS — J302 Other seasonal allergic rhinitis: Secondary | ICD-10-CM

## 2015-05-18 MED ORDER — AZITHROMYCIN 250 MG PO TABS: ORAL_TABLET | ORAL | Status: DC

## 2015-05-18 NOTE — Progress Notes (Signed)
Subjective:    [name], female    DOB: Apr 27, 1946, 69 y.o.   MRN: [id number]  HPI 02/03/11- 4 yoF never smoker followed for allergic rhinitis, rhinosinusitis, asthmatic bronchitis, complicated by , GERD, rheumatoid arthritis. Last here August, 3, 2011 She has has done much better since last here- maybe because she is past the turmoil of moving last year. Not needing her nebulizer recently. Rescue inhaler used less than once/ week. Talked about the death of her aged mother.  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   02/06/12- 64 yoF followed for allergic rhinitis, rhinosinusitis, asthmatic bronchitis, complicated by , GERD, rheumatoid arthritis   Follows for: takes vaccine and has some sinus drainage when she eats.  She has responded a couple of x2 a Z-Pak when needed. Overall much better since she moved to her new home 2 years ago. Uses her rescue inhaler at bedtime. Less nighttime cough she lies on her side. 5 grandchildren stay with her, which is a stress and the source of colds. Watery rhinorrhea when she is.Troches have worked well for yeast prn.   03/28/2012 Acute OV  Complains of prod cough with w/ green/yellow/white mucus, wheezing, increased SOB, chills x2.5weeks, worse x1day.  OTC not helping .  Cough is keeping her up at night.  No hemoptysis or chest pian .  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  Never smoker.   09/20/12- 66 yoF followed for allergic rhinitis, rhinosinusitis, asthmatic bronchitis, complicated by , GERD/ Crohn's, rheumatoid arthritis  Husband here. FOLLOWS FOR: still having headaches, sinus drainage, and dizzy. Was given Augmentin and cough syrup last week. Had fallen in shower in August 2013-had a hematoma and no broken bones. Now- increased SOB/ DOE x 3 weeks. Dry cough at night. Chronic globus. Mucus plug from nose.  CXR 09/10/12-  IMPRESSION:  No acute findings.  Original Report Authenticated By: Lorin Picket, M.D.  11/08/12- 66 yoF followed for allergic rhinitis, rhinosinusitis, asthmatic bronchitis, complicated by , GERD/ Crohn's, rheumatoid arthritis  Husband here. FOLLOWS JQD:UKRCV on allergy vaccine1:10 GO and doing well; no reactions. Has had flare up with allergies; for about 3 weeks now. Never really cleared completely after exacerbation at last visit when we gave cefdinir.Marland Kitchen Has had increasing cough with scant yellow green sputum starting about a week ago. Denies fever or sore throat. Occasional wheeze. Increased ankle edema. Dr. Arelia Sneddon increased her Lasix. She had fallen on ice during the winter and badly skinned her right pretibial area and left knee. We talked about being seen at a wound center. She is to see a neurologist about her falls. She complains of always feeling very tired and husband says she snores. I educated them on watching for sleep apnea. CXR 09/10/12 IMPRESSION:  No acute findings.  Original Report Authenticated By: Lorin Picket, M.D.  06/11/13-   05/10/13- 67 yoF followed for allergic rhinitis, rhinosinusitis, asthmatic bronchitis, complicated by , GERD/ Crohn's, rheumatoid arthritis  Husband here. Allergy vaccine1:10 GO FOLLOWS FOR:  Sinus pressure w/ PND and cough w/ mucus green in color x2 months.  Also, having abdominal pain and frequent urination Had flu vaccine. Describes 2 months increased sinus drainage with some green mucus, frontal headaches. No fever, ear pressure or wheezing. Still recovering from abrasion to legs when she fell last winter. Has been going to wound Center.  05/10/13- 67 yoF followed for allergic rhinitis, rhinosinusitis, asthmatic bronchitis, complicated by , GERD/ Crohn's, rheumatoid arthritis  Husband here.  Allergy vaccine1:10 GO  FOLLOWS FOR: Sinus pressure w/ PND and cough w/ mucus green in color x2 months. Also, having abdominal pain and frequent urination  Had flu vaccine. Describes 2 months increased sinus drainage with some  green mucus, frontal headaches. No fever, ear pressure or wheezing.  Still recovering from abrasion to legs when she fell last winter. Has been going to wound Center.  06/11/13- 67 yoF followed for allergic rhinitis, rhinosinusitis, asthmatic bronchitis, complicated by , GERD/ Crohn's, rheumatoid arthritis   c/o sinus congestion x 1 wk. feels like fluid in head and ears,no ear pain,coughing at night-yellow and green,feels like fever today,nasal  congestion -clear This began with an upper respiratory infection but has become focused in her head She continues allergy vaccine 1: 10 GO  07/31/13 Acute OV  Complains of prod cough with green/yellow mucus, increased SOB, wheezing, fever, body aches.  began Augmentin and steroids on 1/13, fever began last night.  symptoms worse last night.  Patient says she was seen by her family doctor on January 12 and recommend to start on a prednisone taper. She was also called in Augmentin and Phenergan With Codeine cough syrup, which she began yesterday. She complains of a productive cough with thick, yellow green mucus. She's had fever that began last night. MAXIMUM TEMPERATURE 101.5. She also has body aches. She did have the flu shot. Patient's husband has had similar symptoms, but milder She denies any nausea, vomiting, diarrhea, abdominal pain, rash, recent travel Patient is eating, but has a decreased appetite. She says that Phenergan, codeine cough syrup is not helping. She is coughing nonstop. She denies any frank, hemoptysis, calf pain, orthopnea, or edema. Did notice a speck of blood. This morning, when she coughed very hard. >refer to ER for admit for bilateral PNA   08/13/2013 [facility] follow up  Returns for a post hospital follow up . Was admitted for bilateral PNA. Reports is much improved since discharge.  Finished abx 2 days ago, still taking pred taper..  CXR today shows near complete resolution of bilateral aspdz. She is feeling much better.   Still has some weakness.  No fever, chest pain or edema.  Appetite is returning. No n/v//d.   08/30/13- 67 yoF followed for allergic rhinitis, rhinosinusitis, asthmatic bronchitis, complicated by , GERD/ Crohn's, rheumatoid arthritis   Husband here FOLLOWS FOR:  Increased sob, wheezing and cough with green mucus x3 days Acute visit, for 5 days of new cold after resolving bronchitis exacerbation a few weeks ago. Exposed to grandchildren who were sick. Immunosuppressed by her rheumatoid arthritis and Crohn's disease. Increased dry cough, throat uncomfortable, low-grade fever, nasal congestion with yellow/green mucus CXR 08/13/13 IMPRESSION:  Near-complete resolution of left effusion and basilar airspace  disease. No new abnormality.  Electronically Signed  By: Inge Rise M.D.  On: 08/13/2013 11:21  09/05/13- 76 yoF followed for allergic rhinitis, rhinosinusitis, asthmatic bronchitis, complicated by  GERD/ Crohn's, rheumatoid arthritis   Husband here ACUTE  VISIT: patient having low O2 levels, SOB, and fatigued-just wants to sleep. Not eating well at all. She had come off of prednisone. Glucose was 144. She denies fever , purulent sputum, chest pain. >>BREO rx   10/14/13 Acute OV - TP Complains of persistent symptoms.  Complains that she feels her breathing is getting worse. Wears out breath easily . Last ov with persistent desats, given BREO  . Did not feel this helped much at all.  Complains of prod cough with dark green  mucus, increased SOB, chest tightness x1 week.  Denies any f/c/s, nausea, vomiting, hemoptysis.  sats were 86% RA upon entering exam room.   Was called Doxycycline last week, finished on 3/27 Congestion is better but still having DOE . Slow to resolve flare now w/ persistent exertional hypoxemia  Check CXR today  Consider PFTs in future  Plan  Prednisone taper over next week  Chest xray today .  Begin Oxygen 2l/m with activity and At bedtime  Follow up Dr. Annamaria Boots  In 3-4 weeks and As needed  Please contact office for sooner follow up if symptoms do not improve or worsen or seek emergency care   11/12/13- 67 yoF followed for allergic rhinitis, rhinosinusitis, asthmatic bronchitis, complicated by  GERD/ Crohn's, rheumatoid arthritis   Husband here FOLLOWS FOR:  Cough has improved.  Now on 2 - 3 L O2 / [employer] sleep/ exertion. Needed oxygen after last visit, used for sleep and as needed. Green sputum has now cleared. Treating postnasal drip with Claritin and Mucinex DM. Continues allergy vaccine 1:10 GO CXR 10/14/13 IMPRESSION:  Stable chronic lung disease with bibasilar scarring/atelectasis.  Electronically Signed  By: Aletta Edouard M.D.  On: 10/14/2013 15:50  03/10/14-67 yoF followed for allergic rhinitis, rhinosinusitis, asthmatic bronchitis, complicated by  GERD/ Crohn's, rheumatoid arthritis   Husband here 4 month follow up.  Still has prod cough with clear to yellow mucus in the mornings and qhs.  DOE is unchanged. She complains that since pneumonia in January, she has had a foreign body sensation of "something hanging back there" behind her soft palate.no headache and not blowing much from her nose. No sore throat. She describes how a chiropractor felt something against her forehead and she blew out a small nodular and reticular structure she compares to  "grapes and leaves", with no change in how her nose or throat felt. She has been comfortable since stopping oxygen once she recovered from the pneumonia.   03/27/2014 Acute OV  (Followed for allergic rhinitis, rhinosinusitis, asthmatic bronchitis, complicated by  GERD/ Crohn's, OA ) Complains of  Productive cough, congestion w/ thick yellow and green mucus for 2 weeks . Marland Kitchen Has sore throat.  Was seen by ENT recently , told possible ? GERD, rx Prilosec 67m  . Still has sore throat.  Started taking some left over augmentin few days ago. Has not helped. Last dose of aumgentin this am.  More wheezing  and cough for last few days.  Complains of stress incontinence with coughing .  No fever, hemoptysis, chest pain, orthopnea, edema or n/v/d.  Wants refill of tussionex , cough is keeping her up at night .  Requests Diflucan rx as she gets frequent yeast infections on abx.   05/15/14- 67 yoF followed for allergic rhinitis, rhinosinusitis, asthmatic bronchitis, complicated by  GERD/ Crohn's, rheumatoid arthritis   Husband here FOLLOWS FOR: patient feels like something in her throat.   06/10/14- 89yoF followed for allergic rhinitis, rhinosinusitis, asthmatic bronchitis, complicated by  GERD/ Crohn's, rheumatoid arthritis    FOLLOWS FOR: wheezing, cough-yellow in color, SOB as well. ? fever as well. We sent Zpak and cough syrup 6 days ago. Continues allergy vaccine 1:10 GO  07/03/14-68 yoF followed for allergic rhinitis, rhinosinusitis, asthmatic bronchitis, complicated by  GERD/ Crohn's, rheumatoid arthritis Follows For: Finished Prednisone 2 days ago  - Still c/o prod cough ( green), some sob and wheezing,        husband here Still having some green sputum from chest and  may be from nose. Just finished prednisone 2 days ago. We discussed available tools. Husband thinks she is somewhat better. They understand her autoimmune problems with Crohn's disease and rheumatoid arthritis impact her resistance to infection. CXR 06/10/14 IMPRESSION: Stable chronic bronchitic changes. There is no evidence of pneumonia nor CHF. Electronically Signed  By: David Martinique  On: 06/10/2014 14:09  09/10/14- 63 yoF followed for allergic rhinitis, rhinosinusitis, asthmatic bronchitis, complicated by  GERD/ Crohn's, rheumatoid arthritis  husband here FOLLOWS FOR: Pt states she continues to have "clogged" feeling in her throat-patient started taking her Zithromax QD instead of M,W, F as Rx'd-felt this has helped clear things up as well. Patient has had 2 cortisone injections in her shoulder this month. That may be  why she is doing quite well. Took her Zithromax daily for acute illness, ending 1 week ago. Diet program-lost 10 pounds  05/18/15- 69 yoF followed for allergic rhinitis, rhinosinusitis, asthmatic bronchitis, complicated by  GERD/ Crohn's, rheumatoid arthritis  husband here allergy vaccine 1:10 GO              husband here                  had flu vaccine October 5 FOLLOWS FOR: still on vaccine and doing well. Pt states she has (every so often) that she is going to get sick-congestion in throat. Currently on prednisone taper for arthritis. Took Z-Pak last week and is returning to maintenance Zithromax 250 mg 3 days per week. She feels maintenance program has helped reduce bronchitis exacerbations this year. Has lost weight with diet and exercise  ROS-see HPI Constitutional:   +  weight loss, night sweats, fevers, chills, fatigue, lassitude. HEENT:   No-  headaches, difficulty swallowing, tooth/dental problems, sore throat,       No-  sneezing, itching, ear ache, nasal congestion, +post nasal drip,  CV:  No-   chest pain, orthopnea, PND, swelling in lower extremities, anasarca,                                                      dizziness, palpitations Resp: No-   shortness of breath with exertion or at rest.               productive cough, non-productive cough,  No- coughing up of blood.             change in color of mucus.  No- wheezing.   Skin: No-   rash or lesions. GI:  No-   heartburn, indigestion, abdominal pain, nausea, vomiting,  GU:  MS:  No-   joint pain or swelling.   Neuro-     nothing unusual Psych:  No- change in mood or affect. No depression or anxiety.  No memory loss.  Objective:  OBJ- Physical Exam  General- Alert, Oriented, Affect-appropriate, Distress- none acute Skin- rash-none, lesions- none, excoriation- none Lymphadenopathy- none Head- atraumatic            Eyes- Gross vision intact, PERRLA, conjunctivae and secretions clear            Ears- Hearing,  canals-normal            Nose- Clear, no-Septal dev, mucus, polyps, erosion, perforation             Throat- Mallampati II , mucosa-clear, drainage-  none, tonsils-                                               atrophic, voice normal, +question light thrush Neck- flexible , trachea midline, no stridor , thyroid nl, carotid no bruit Chest - symmetrical excursion , unlabored           Heart/CV- RRR , no murmur , no gallop  , no rub, nl s1 s2                           - JVD- none , edema- none, stasis changes- none, varices- none           Lung-   cough-none, wheezing -none, clear to quiet auscultation           Chest wall-  Abd-  Br/ Gen/ Rectal- Not done, not indicated Extrem- +scar from treated wound Neuro- grossly intact to observation

## 2015-05-18 NOTE — Patient Instructions (Signed)
We can continue maintenance zithromax since it helps

## 2015-05-19 NOTE — Assessment & Plan Note (Signed)
Recent exacerbation now returning to baseline. She wants to continue Zithromax 250 mg 3 days per week as preventative maintenance

## 2015-05-19 NOTE — Assessment & Plan Note (Signed)
She continues to feel allergy vaccine as helpful, 1:10 GO

## 2015-06-08 ENCOUNTER — Telehealth: Payer: Self-pay | Admitting: Internal Medicine

## 2015-06-08 NOTE — Telephone Encounter (Signed)
Allergy Serum Extract Date Mixed: 06/08/15 Vial: 1 Strength: 1:10 Here/Mail/Pick Up: mail Mixed By: tbs Last OV: 05/18/15 Pending OV: 11/19/15

## 2015-06-09 ENCOUNTER — Ambulatory Visit (INDEPENDENT_AMBULATORY_CARE_PROVIDER_SITE_OTHER): Payer: Medicare Other

## 2015-06-09 DIAGNOSIS — J309 Allergic rhinitis, unspecified: Secondary | ICD-10-CM

## 2015-06-13 ENCOUNTER — Other Ambulatory Visit: Payer: Self-pay | Admitting: Internal Medicine

## 2015-06-15 DIAGNOSIS — K529 Noninfective gastroenteritis and colitis, unspecified: Secondary | ICD-10-CM | POA: Diagnosis not present

## 2015-06-15 DIAGNOSIS — K219 Gastro-esophageal reflux disease without esophagitis: Secondary | ICD-10-CM | POA: Diagnosis not present

## 2015-06-16 NOTE — Telephone Encounter (Signed)
Ok to refill as requested

## 2015-06-16 NOTE — Telephone Encounter (Signed)
Tess pearls last refilled 09/11/14 TAKE 1 CAPSULE (100 MG TOTAL) BY MOUTH 4 (FOUR) TIMES DAILY AS NEEDED FOR COUGH #50 x 1 refill Please advise Dr. Annamaria Boots thanks

## 2015-07-02 DIAGNOSIS — M25442 Effusion, left hand: Secondary | ICD-10-CM | POA: Diagnosis not present

## 2015-07-02 DIAGNOSIS — M0609 Rheumatoid arthritis without rheumatoid factor, multiple sites: Secondary | ICD-10-CM | POA: Diagnosis not present

## 2015-07-02 DIAGNOSIS — M7532 Calcific tendinitis of left shoulder: Secondary | ICD-10-CM | POA: Diagnosis not present

## 2015-07-02 DIAGNOSIS — M25441 Effusion, right hand: Secondary | ICD-10-CM | POA: Diagnosis not present

## 2015-07-02 DIAGNOSIS — M797 Fibromyalgia: Secondary | ICD-10-CM | POA: Diagnosis not present

## 2015-07-23 DIAGNOSIS — Z01419 Encounter for gynecological examination (general) (routine) without abnormal findings: Secondary | ICD-10-CM | POA: Diagnosis not present

## 2015-07-23 DIAGNOSIS — Z6830 Body mass index (BMI) 30.0-30.9, adult: Secondary | ICD-10-CM | POA: Diagnosis not present

## 2015-07-23 DIAGNOSIS — Z1231 Encounter for screening mammogram for malignant neoplasm of breast: Secondary | ICD-10-CM | POA: Diagnosis not present

## 2015-07-31 DIAGNOSIS — M0609 Rheumatoid arthritis without rheumatoid factor, multiple sites: Secondary | ICD-10-CM | POA: Diagnosis not present

## 2015-07-31 DIAGNOSIS — Z79899 Other long term (current) drug therapy: Secondary | ICD-10-CM | POA: Diagnosis not present

## 2015-08-07 ENCOUNTER — Telehealth: Payer: Self-pay | Admitting: Internal Medicine

## 2015-08-07 MED ORDER — PROMETHAZINE-CODEINE 6.25-10 MG/5ML PO SYRP: 5.0000 mL | ORAL_SOLUTION | Freq: Four times a day (QID) | ORAL | Status: DC | PRN

## 2015-08-07 MED ORDER — DOXYCYCLINE HYCLATE 100 MG PO TABS: ORAL_TABLET | ORAL | Status: DC

## 2015-08-07 NOTE — Telephone Encounter (Signed)
Spoke with pt. Reports cough, PND, sinus congestion and wheezing. Onset was 1 week ago. Cough is now producing dark green mucus. Denies chest tightness, SOB or fever. Would like to have something sent in for cough and a possible antibiotic. CY - please advise. Thanks.  Allergies  Allergen Reactions  . Lactose Intolerance (Gi) Diarrhea  . Nitrofuran Derivatives     shakiness  . Other Diarrhea    "lettuce only"  . Tdap [Diphth-Acell Pertussis-Tetanus]     Shaking uncontrollably  . Erythromycin Other (See Comments)    Diarrhea   . Tetanus Toxoids Other (See Comments)    Shaking uncontrollable     Current Outpatient Prescriptions on File Prior to Visit  Medication Sig Dispense Refill  . albuterol (PROAIR HFA) 108 (90 BASE) MCG/ACT inhaler Inhale 2 puffs into the lungs every 4 (four) hours as needed for wheezing or shortness of breath. 1 Inhaler 3  . albuterol (PROVENTIL) (2.5 MG/3ML) 0.083% nebulizer solution Take 3 mLs (2.5 mg total) by nebulization every 6 (six) hours as needed for wheezing or shortness of breath. 75 mL 5  . aspirin EC 81 MG tablet Take 81 mg by mouth daily.    Marland Kitchen azithromycin (ZITHROMAX) 250 MG tablet Maintenance, 3 days per week as directed 25 each 5  . benzonatate (TESSALON) 100 MG capsule TAKE 1 CAPSULE (100 MG TOTAL) BY MOUTH 4 (FOUR) TIMES DAILY AS NEEDED FOR COUGH. 50 capsule 5  . celecoxib (CELEBREX) 200 MG capsule Take 1 capsule by mouth daily.    . colestipol (COLESTID) 1 G tablet Take 1 tablet in am and 1 tablet in pm.    . DiphenhydrAMINE HCl, Sleep, (UNISOM SLEEPMELTS PO) Take by mouth at bedtime.    Marland Kitchen doxycycline (VIBRA-TABS) 100 MG tablet Take 2 today then 1 daily 8 tablet 0  . ergocalciferol (VITAMIN D2) 50000 UNITS capsule Take 50,000 Units by mouth every 14 (fourteen) days.      . fexofenadine (ALLEGRA) 180 MG tablet Take 90 mg by mouth at bedtime.    . fluconazole (DIFLUCAN) 150 MG tablet 1 every other day 10 tablet 1  . FLUoxetine (PROZAC) 20 MG  tablet Take 20 mg by mouth daily.      . fluticasone (FLONASE) 50 MCG/ACT nasal spray Place 2 sprays into the nose daily.     . furosemide (LASIX) 20 MG tablet Take 40 mg by mouth daily.     Marland Kitchen glycopyrrolate (ROBINUL) 1 MG tablet Take 1 mg by mouth 3 (three) times daily as needed (for IBS).     Marland Kitchen guaiFENesin (MUCINEX) 600 MG 12 hr tablet Take 1,200 mg by mouth 2 (two) times daily as needed.    . Insulin Syringe-Needle U-100 (B-D INSULIN SYRINGE 1CC/25G) 25G X 5/8" 1 ML MISC Use as directed weekly for allergy injections 100 each 0  . ipratropium (ATROVENT) 0.03 % nasal spray Place 1-2 sprays into both nostrils 2 (two) times daily. 90 mL 3  . Menthol, Topical Analgesic, (BLUE-EMU MAXIMUM STRENGTH EX) Apply 1 application topically daily as needed (for hip, back pain).    . mirtazapine (REMERON) 15 MG tablet Take 45 mg by mouth at bedtime.     . montelukast (SINGULAIR) 10 MG tablet Take 1 tablet (10 mg total) by mouth at bedtime. 90 tablet 3  . NONFORMULARY OR COMPOUNDED ITEM Allergy Vaccine 1:10 Given at Home    . omeprazole (PRILOSEC) 40 MG capsule Take 40 mg by mouth daily.    . potassium chloride SA (K-DUR,KLOR-CON)  20 MEQ tablet Take 20 mEq by mouth 3 (three) times daily.    . pravastatin (PRAVACHOL) 40 MG tablet Take 40 mg by mouth daily.     . predniSONE (DELTASONE) 5 MG tablet 1-2 tabs once daily x 10 days 20 tablet 0  . promethazine-codeine (PHENERGAN WITH CODEINE) 6.25-10 MG/5ML syrup Take 5 mLs by mouth every 6 (six) hours as needed for cough. 200 mL 0  . traMADol (ULTRAM) 50 MG tablet Take 50 mg by mouth every 8 (eight) hours as needed for pain.     No current facility-administered medications on file prior to visit.

## 2015-08-07 NOTE — Telephone Encounter (Signed)
Offer  Doxycycline 100 mg, # 8, 2 today then one daily            prometh codeine cough syrup    200 ml,  5 ml every 6 hours if needed for cough

## 2015-08-07 NOTE — Telephone Encounter (Signed)
Pt is aware of CY's recommendations. Rxs have been sent/called in. Nothing further was needed.

## 2015-08-10 ENCOUNTER — Ambulatory Visit (INDEPENDENT_AMBULATORY_CARE_PROVIDER_SITE_OTHER)
Admission: RE | Admit: 2015-08-10 | Discharge: 2015-08-10 | Disposition: A | Discharge status: Home / Self Care | Payer: Medicare Other | Source: Ambulatory Visit | Attending: Adult Health | Admitting: Adult Health

## 2015-08-10 ENCOUNTER — Telehealth: Payer: Self-pay | Admitting: Internal Medicine

## 2015-08-10 ENCOUNTER — Ambulatory Visit (INDEPENDENT_AMBULATORY_CARE_PROVIDER_SITE_OTHER): Payer: Medicare Other | Admitting: Adult Health

## 2015-08-10 ENCOUNTER — Encounter: Payer: Self-pay | Admitting: Adult Health

## 2015-08-10 VITALS — BP 108/68 | HR 96 | Temp 99.5°F | Ht 61.0 in | Wt 167.0 lb

## 2015-08-10 DIAGNOSIS — R05 Cough: Secondary | ICD-10-CM

## 2015-08-10 DIAGNOSIS — J209 Acute bronchitis, unspecified: Secondary | ICD-10-CM

## 2015-08-10 DIAGNOSIS — R059 Cough, unspecified: Secondary | ICD-10-CM

## 2015-08-10 MED ORDER — PREDNISONE 10 MG PO TABS: ORAL_TABLET | ORAL | Status: DC

## 2015-08-10 MED ORDER — AMOXICILLIN-POT CLAVULANATE 875-125 MG PO TABS: 1.0000 | ORAL_TABLET | Freq: Two times a day (BID) | ORAL | Status: AC

## 2015-08-10 MED ORDER — LEVALBUTEROL HCL 0.63 MG/3ML IN NEBU
0.6300 mg | INHALATION_SOLUTION | Freq: Once | RESPIRATORY_TRACT | Status: AC
  Administered 2015-08-10: 0.63 mg via RESPIRATORY_TRACT

## 2015-08-10 NOTE — Telephone Encounter (Signed)
Spoke with pt. States that her symptoms from Friday are now worse. Reports increased coughing and SOB. Cough is not producing mucus at this time. On Friday she was given Doxy and promethazine-codeine cough syrup. These medications are not working. Would like to be seen. CY - please advise. Thanks.  Allergies  Allergen Reactions  . Lactose Intolerance (Gi) Diarrhea  . Nitrofuran Derivatives     shakiness  . Other Diarrhea    "lettuce only"  . Tdap [Diphth-Acell Pertussis-Tetanus]     Shaking uncontrollably  . Erythromycin Other (See Comments)    Diarrhea   . Tetanus Toxoids Other (See Comments)    Shaking uncontrollable     Current Outpatient Prescriptions on File Prior to Visit  Medication Sig Dispense Refill  . albuterol (PROAIR HFA) 108 (90 BASE) MCG/ACT inhaler Inhale 2 puffs into the lungs every 4 (four) hours as needed for wheezing or shortness of breath. 1 Inhaler 3  . albuterol (PROVENTIL) (2.5 MG/3ML) 0.083% nebulizer solution Take 3 mLs (2.5 mg total) by nebulization every 6 (six) hours as needed for wheezing or shortness of breath. 75 mL 5  . aspirin EC 81 MG tablet Take 81 mg by mouth daily.    Marland Kitchen azithromycin (ZITHROMAX) 250 MG tablet Maintenance, 3 days per week as directed 25 each 5  . benzonatate (TESSALON) 100 MG capsule TAKE 1 CAPSULE (100 MG TOTAL) BY MOUTH 4 (FOUR) TIMES DAILY AS NEEDED FOR COUGH. 50 capsule 5  . celecoxib (CELEBREX) 200 MG capsule Take 1 capsule by mouth daily.    . colestipol (COLESTID) 1 G tablet Take 1 tablet in am and 1 tablet in pm.    . DiphenhydrAMINE HCl, Sleep, (UNISOM SLEEPMELTS PO) Take by mouth at bedtime.    Marland Kitchen doxycycline (VIBRA-TABS) 100 MG tablet Take 2 today then 1 daily 8 tablet 0  . ergocalciferol (VITAMIN D2) 50000 UNITS capsule Take 50,000 Units by mouth every 14 (fourteen) days.      . fexofenadine (ALLEGRA) 180 MG tablet Take 90 mg by mouth at bedtime.    . fluconazole (DIFLUCAN) 150 MG tablet 1 every other day 10 tablet  1  . FLUoxetine (PROZAC) 20 MG tablet Take 20 mg by mouth daily.      . fluticasone (FLONASE) 50 MCG/ACT nasal spray Place 2 sprays into the nose daily.     . furosemide (LASIX) 20 MG tablet Take 40 mg by mouth daily.     Marland Kitchen glycopyrrolate (ROBINUL) 1 MG tablet Take 1 mg by mouth 3 (three) times daily as needed (for IBS).     Marland Kitchen guaiFENesin (MUCINEX) 600 MG 12 hr tablet Take 1,200 mg by mouth 2 (two) times daily as needed.    . Insulin Syringe-Needle U-100 (B-D INSULIN SYRINGE 1CC/25G) 25G X 5/8" 1 ML MISC Use as directed weekly for allergy injections 100 each 0  . ipratropium (ATROVENT) 0.03 % nasal spray Place 1-2 sprays into both nostrils 2 (two) times daily. 90 mL 3  . Menthol, Topical Analgesic, (BLUE-EMU MAXIMUM STRENGTH EX) Apply 1 application topically daily as needed (for hip, back pain).    . mirtazapine (REMERON) 15 MG tablet Take 45 mg by mouth at bedtime.     . montelukast (SINGULAIR) 10 MG tablet Take 1 tablet (10 mg total) by mouth at bedtime. 90 tablet 3  . NONFORMULARY OR COMPOUNDED ITEM Allergy Vaccine 1:10 Given at Home    . omeprazole (PRILOSEC) 40 MG capsule Take 40 mg by mouth daily.    Marland Kitchen  potassium chloride SA (K-DUR,KLOR-CON) 20 MEQ tablet Take 20 mEq by mouth 3 (three) times daily.    . pravastatin (PRAVACHOL) 40 MG tablet Take 40 mg by mouth daily.     . predniSONE (DELTASONE) 5 MG tablet 1-2 tabs once daily x 10 days 20 tablet 0  . promethazine-codeine (PHENERGAN WITH CODEINE) 6.25-10 MG/5ML syrup Take 5 mLs by mouth every 6 (six) hours as needed for cough. 200 mL 0  . traMADol (ULTRAM) 50 MG tablet Take 50 mg by mouth every 8 (eight) hours as needed for pain.     No current facility-administered medications on file prior to visit.

## 2015-08-10 NOTE — Telephone Encounter (Signed)
Per CY-patient can see TP today; Estill Bamberg gave okay for patient to come in at 11:00am or 2:45pm slot today. Thanks.

## 2015-08-10 NOTE — Telephone Encounter (Signed)
Spoke with pt. She has been scheduled for 11am today with TP. Nothing further was needed.

## 2015-08-10 NOTE — Assessment & Plan Note (Signed)
Flare   Plan  Stop Doxycycline  Begin Augmentin 838m Twice daily  For 7days  Continue on Probiotic daily  Increase Prednisone 272mdaily 1 week then 1067maily for 1 week then 1/2 tab daily- hold at this dose.  Take extra Lasix for next 2 days.  Follow up Dr. YouAnnamaria Bootsn 3-4 weeks and As needed   Please contact office for sooner follow up if symptoms do not improve or worsen or seek emergency care

## 2015-08-10 NOTE — Progress Notes (Signed)
Subjective:    [name], female    DOB: [birth year], 70 y.o.   MRN: [id number]  HPI 70 yo never smoker with AR, AB and RA   08/10/2015 Acute OV  (Followed for allergic rhinitis, rhinosinusitis, asthmatic bronchitis, complicated by  GERD/ RA  ) Pt presents for an acute office visit. Complains of non prod cough, sinus drainage/pressure, chest congestion/tightness, wheezing and low grade fever at times starting on 08/06/15. Currently taking Doxycycline. Does not feel it is working. Still coughing up thick green /brown mucus.   Denies any nausea or vomiting.  On Methotrexate recently with GI reaction . Took for about 3 months. Has been off for 2 weeks.  Prednisone 52m daily -used for RA per rheumatology  CXR today shows mild increased interstitial prominence .  Denies chest pain, orthopnea, n/v/d or hemoptysis .   Past Medical History  Diagnosis Date  . Esophageal reflux   . Acute asthmatic bronchitis   . Rheumatoid arthritis(714.0)   . Allergic rhinitis    Current Outpatient Prescriptions on File Prior to Visit  Medication Sig Dispense Refill  . albuterol (PROAIR HFA) 108 (90 BASE) MCG/ACT inhaler Inhale 2 puffs into the lungs every 4 (four) hours as needed for wheezing or shortness of breath. 1 Inhaler 3  . albuterol (PROVENTIL) (2.5 MG/3ML) 0.083% nebulizer solution Take 3 mLs (2.5 mg total) by nebulization every 6 (six) hours as needed for wheezing or shortness of breath. 75 mL 5  . aspirin EC 81 MG tablet Take 81 mg by mouth daily.    .Marland Kitchenazithromycin (ZITHROMAX) 250 MG tablet Maintenance, 3 days per week as directed 25 each 5  . benzonatate (TESSALON) 100 MG capsule TAKE 1 CAPSULE (100 MG TOTAL) BY MOUTH 4 (FOUR) TIMES DAILY AS NEEDED FOR COUGH. 50 capsule 5  . colestipol (COLESTID) 1 G tablet Take 1 tablet in am and 1 tablet in pm.    . DiphenhydrAMINE HCl, Sleep, (UNISOM SLEEPMELTS PO) Take by mouth at bedtime.    .Marland Kitchendoxycycline (VIBRA-TABS) 100 MG tablet Take 2 today  then 1 daily 8 tablet 0  . ergocalciferol (VITAMIN D2) 50000 UNITS capsule Take 50,000 Units by mouth every 14 (fourteen) days.      . fluconazole (DIFLUCAN) 150 MG tablet 1 every other day 10 tablet 1  . FLUoxetine (PROZAC) 20 MG tablet Take 20 mg by mouth daily.      . fluticasone (FLONASE) 50 MCG/ACT nasal spray Place 2 sprays into the nose daily.     . furosemide (LASIX) 20 MG tablet Take 40 mg by mouth daily.     .Marland Kitchenglycopyrrolate (ROBINUL) 1 MG tablet Take 1 mg by mouth 3 (three) times daily as needed (for IBS).     .Marland KitchenguaiFENesin (MUCINEX) 600 MG 12 hr tablet Take 1,200 mg by mouth 2 (two) times daily as needed.    . Insulin Syringe-Needle U-100 (B-D INSULIN SYRINGE 1CC/25G) 25G X 5/8" 1 ML MISC Use as directed weekly for allergy injections 100 each 0  . ipratropium (ATROVENT) 0.03 % nasal spray Place 1-2 sprays into both nostrils 2 (two) times daily. 90 mL 3  . Menthol, Topical Analgesic, (BLUE-EMU MAXIMUM STRENGTH EX) Apply 1 application topically daily as needed (for hip, back pain).    . mirtazapine (REMERON) 15 MG tablet Take 45 mg by mouth at bedtime.     . montelukast (SINGULAIR) 10 MG tablet Take 1 tablet (10 mg total) by mouth at bedtime. 90 tablet 3  .  NONFORMULARY OR COMPOUNDED ITEM Allergy Vaccine 1:10 Given at Home    . omeprazole (PRILOSEC) 40 MG capsule Take 40 mg by mouth daily.    . potassium chloride SA (K-DUR,KLOR-CON) 20 MEQ tablet Take 20 mEq by mouth 3 (three) times daily.    . pravastatin (PRAVACHOL) 40 MG tablet Take 40 mg by mouth daily.     . predniSONE (DELTASONE) 5 MG tablet 1-2 tabs once daily x 10 days (Patient taking differently: Take 1 tablet by mouth daily) 20 tablet 0  . promethazine-codeine (PHENERGAN WITH CODEINE) 6.25-10 MG/5ML syrup Take 5 mLs by mouth every 6 (six) hours as needed for cough. 200 mL 0  . traMADol (ULTRAM) 50 MG tablet Take 50 mg by mouth every 8 (eight) hours as needed for pain.    . celecoxib (CELEBREX) 200 MG capsule Take 1 capsule  by mouth daily. Reported on 08/10/2015    . fexofenadine (ALLEGRA) 180 MG tablet Take 90 mg by mouth at bedtime. Reported on 08/10/2015     No current facility-administered medications on file prior to visit.      Complains of  ROS-see HPI Constitutional:   No-   weight loss, night sweats, fevers, chills, fatigue, lassitude. HEENT:   No-  headaches, difficulty swallowing, tooth/dental problems, sore throat,       No-  sneezing, itching, ear ache, nasal congestion, +post nasal drip,  CV:  No-   chest pain, orthopnea, PND, swelling in lower extremities, anasarca,                                  dizziness, palpitations Resp: No-   shortness of breath with exertion or at rest.              +  productive cough,  No non-productive cough,  No- coughing up of blood.              No-   change in color of mucus.  No- wheezing.   Skin: No-   rash or lesions. GI:  No-   heartburn, indigestion, abdominal pain, nausea, vomiting,  GU:  MS:  No-   joint pain or swelling.   Neuro-     nothing unusual Psych:  No- change in mood or affect. No depression or anxiety.  No memory loss.  Objective:  OBJ- Physical Exam  Filed Vitals:   08/10/15 1103  BP: 108/68  Pulse: 96  Temp: 99.5 F (37.5 C)  TempSrc: Oral  Height: _0  (1.549 m)  Weight: 167 lb (75.751 kg)  SpO2: 95%    GEN: A/Ox3; pleasant , NAD, elderly , barking cough   HEENT:  [place]/AT,  EACs-clear, TMs-wnl, NOSE-clear, THROAT-clear, no lesions, no postnasal drip or exudate noted.   NECK:  Supple w/ fair ROM; no JVD; normal carotid impulses w/o bruits; no thyromegaly or nodules palpated; no lymphadenopathy.  RESP scattered rhonchi , no accessory muscle use, no dullness to percussion  CARD:  RRR, no m/r/g  , no peripheral edema, pulses intact, no cyanosis or clubbing.  GI:   Soft & nt; nml bowel sounds; no organomegaly or masses detected.  Musco: Warm bil, no deformities or joint swelling noted.   Neuro: alert, no focal deficits  noted.    Skin: Warm, no lesions or rashes  CXR 08/10/2015 reviewed independently  Mild interstiital prominence

## 2015-08-10 NOTE — Patient Instructions (Addendum)
Stop Doxycycline  Begin Augmentin 823m Twice daily  For 7days  Continue on Probiotic daily  Increase Prednisone 268mdaily 1 week then 1051maily for 1 week then 1/2 tab daily- hold at this dose.  Take extra Lasix for next 2 days.  Follow up Dr. YouAnnamaria Bootsn 3-4 weeks and As needed   Please contact office for sooner follow up if symptoms do not improve or worsen or seek emergency care

## 2015-08-14 ENCOUNTER — Telehealth: Payer: Self-pay | Admitting: Internal Medicine

## 2015-08-14 MED ORDER — PROMETHAZINE-CODEINE 6.25-10 MG/5ML PO SYRP: 5.0000 mL | ORAL_SOLUTION | Freq: Four times a day (QID) | ORAL | Status: DC | PRN

## 2015-08-14 NOTE — Telephone Encounter (Signed)
Ok to refill cough syrup

## 2015-08-14 NOTE — Telephone Encounter (Signed)
Patient has been sick x 1 week.  Patient came in to see TP on 08/10/15, but she still has cough and would like to have refill of cough syrup.  Pharmacy: [facility] [place]  Allergies  Allergen Reactions  . Lactose Intolerance (Gi) Diarrhea  . Nitrofuran Derivatives     shakiness  . Other Diarrhea    "lettuce only"  . Tdap [Diphth-Acell Pertussis-Tetanus]     Shaking uncontrollably  . Erythromycin Other (See Comments)    Diarrhea   . Tetanus Toxoids Other (See Comments)    Shaking uncontrollable     Current Outpatient Prescriptions on File Prior to Visit  Medication Sig Dispense Refill  . albuterol (PROAIR HFA) 108 (90 BASE) MCG/ACT inhaler Inhale 2 puffs into the lungs every 4 (four) hours as needed for wheezing or shortness of breath. 1 Inhaler 3  . albuterol (PROVENTIL) (2.5 MG/3ML) 0.083% nebulizer solution Take 3 mLs (2.5 mg total) by nebulization every 6 (six) hours as needed for wheezing or shortness of breath. 75 mL 5  . amoxicillin-clavulanate (AUGMENTIN) 875-125 MG tablet Take 1 tablet by mouth 2 (two) times daily. 14 tablet 0  . aspirin EC 81 MG tablet Take 81 mg by mouth daily.    Marland Kitchen azithromycin (ZITHROMAX) 250 MG tablet Maintenance, 3 days per week as directed 25 each 5  . benzonatate (TESSALON) 100 MG capsule TAKE 1 CAPSULE (100 MG TOTAL) BY MOUTH 4 (FOUR) TIMES DAILY AS NEEDED FOR COUGH. 50 capsule 5  . celecoxib (CELEBREX) 200 MG capsule Take 1 capsule by mouth daily. Reported on 08/10/2015    . colestipol (COLESTID) 1 G tablet Take 1 tablet in am and 1 tablet in pm.    . DiphenhydrAMINE HCl, Sleep, (UNISOM SLEEPMELTS PO) Take by mouth at bedtime.    Marland Kitchen doxycycline (VIBRA-TABS) 100 MG tablet Take 2 today then 1 daily 8 tablet 0  . ergocalciferol (VITAMIN D2) 50000 UNITS capsule Take 50,000 Units by mouth every 14 (fourteen) days.      . fexofenadine (ALLEGRA) 180 MG tablet Take 90 mg by mouth at bedtime. Reported on 08/10/2015    . fluconazole (DIFLUCAN) 150 MG tablet 1  every other day 10 tablet 1  . FLUoxetine (PROZAC) 20 MG tablet Take 20 mg by mouth daily.      . fluticasone (FLONASE) 50 MCG/ACT nasal spray Place 2 sprays into the nose daily.     . furosemide (LASIX) 20 MG tablet Take 40 mg by mouth daily.     Marland Kitchen glycopyrrolate (ROBINUL) 1 MG tablet Take 1 mg by mouth 3 (three) times daily as needed (for IBS).     Marland Kitchen guaiFENesin (MUCINEX) 600 MG 12 hr tablet Take 1,200 mg by mouth 2 (two) times daily as needed.    . Insulin Syringe-Needle U-100 (B-D INSULIN SYRINGE 1CC/25G) 25G X 5/8" 1 ML MISC Use as directed weekly for allergy injections 100 each 0  . ipratropium (ATROVENT) 0.03 % nasal spray Place 1-2 sprays into both nostrils 2 (two) times daily. 90 mL 3  . Menthol, Topical Analgesic, (BLUE-EMU MAXIMUM STRENGTH EX) Apply 1 application topically daily as needed (for hip, back pain).    . mirtazapine (REMERON) 15 MG tablet Take 45 mg by mouth at bedtime.     . montelukast (SINGULAIR) 10 MG tablet Take 1 tablet (10 mg total) by mouth at bedtime. 90 tablet 3  . NONFORMULARY OR COMPOUNDED ITEM Allergy Vaccine 1:10 Given at Home    . omeprazole (PRILOSEC) 40 MG capsule  Take 40 mg by mouth daily.    . potassium chloride SA (K-DUR,KLOR-CON) 20 MEQ tablet Take 20 mEq by mouth 3 (three) times daily.    . pravastatin (PRAVACHOL) 40 MG tablet Take 40 mg by mouth daily.     . predniSONE (DELTASONE) 10 MG tablet 2 tabs for 1 week , 1 tab for 1 week and 1/2 daily 60 tablet 0  . predniSONE (DELTASONE) 5 MG tablet 1-2 tabs once daily x 10 days (Patient taking differently: Take 1 tablet by mouth daily) 20 tablet 0  . promethazine-codeine (PHENERGAN WITH CODEINE) 6.25-10 MG/5ML syrup Take 5 mLs by mouth every 6 (six) hours as needed for cough. 200 mL 0  . traMADol (ULTRAM) 50 MG tablet Take 50 mg by mouth every 8 (eight) hours as needed for pain.     No current facility-administered medications on file prior to visit.

## 2015-08-14 NOTE — Telephone Encounter (Signed)
Spoke with the pharmacist  He states pt just had this filled on 08/06/14 and this should have lasted her 10 days I spoke with the pt and she states that she still has some left, but she is afraid she will run out over the wkend  Please advise if okay for early rf thanks

## 2015-08-14 NOTE — Telephone Encounter (Signed)
[place] for early refill

## 2015-08-14 NOTE — Telephone Encounter (Signed)
Rx called into pharmacy. Patient aware. Nothing further needed.

## 2015-08-20 ENCOUNTER — Telehealth: Payer: Self-pay | Admitting: Adult Health

## 2015-08-20 NOTE — Telephone Encounter (Signed)
Pt states that she has completed the Augmentin on 08/19/15 Pt states that she can still hear the rattling and "fluid" in her lungs.  Pt reports yellow/Navon Kotowski mucus production - lighter in quantity than at OV 08/10/15 Pt fears this turning into the PNA Currently taking 47m qd Prednisone Using cough syrup prn and all other meds as directed.   Patient Instructions 08/10/15     Stop Doxycycline  Begin Augmentin 8748mTwice daily For 7days  Continue on Probiotic daily  Increase Prednisone 2016maily 1 week then 59m15mily for 1 week then 1/2 tab daily- hold at this dose.  Take extra Lasix for next 2 days.  Follow up Dr. YounAnnamaria Boots3-4 weeks and As needed  Please contact office for sooner follow up if symptoms do not improve or worsen or seek emergency care    Pt has upcoming appt with TP 08/31/15 Please advise of any further rec's. Thanks.

## 2015-08-20 NOTE — Telephone Encounter (Signed)
Needs ov with repeat CXR and labs with bmet and BNP

## 2015-08-20 NOTE — Telephone Encounter (Signed)
Called and spoke to pt. Informed her of the recs per TP. Appt made with TP on 2.6.17 with CXR. Pt verbalized understanding and denied any further questions or concerns at this time.

## 2015-08-24 ENCOUNTER — Ambulatory Visit (INDEPENDENT_AMBULATORY_CARE_PROVIDER_SITE_OTHER)
Admission: RE | Admit: 2015-08-24 | Discharge: 2015-08-24 | Disposition: A | Discharge status: Home / Self Care | Payer: Medicare Other | Source: Ambulatory Visit | Attending: Adult Health | Admitting: Adult Health

## 2015-08-24 ENCOUNTER — Other Ambulatory Visit: Payer: Self-pay | Admitting: Adult Health

## 2015-08-24 ENCOUNTER — Ambulatory Visit (INDEPENDENT_AMBULATORY_CARE_PROVIDER_SITE_OTHER): Payer: Medicare Other | Admitting: Adult Health

## 2015-08-24 ENCOUNTER — Encounter: Payer: Self-pay | Admitting: Adult Health

## 2015-08-24 VITALS — BP 132/88 | HR 75 | Temp 97.5°F | Ht 61.0 in | Wt 169.0 lb

## 2015-08-24 DIAGNOSIS — J441 Chronic obstructive pulmonary disease with (acute) exacerbation: Secondary | ICD-10-CM | POA: Diagnosis not present

## 2015-08-24 DIAGNOSIS — R05 Cough: Secondary | ICD-10-CM | POA: Diagnosis not present

## 2015-08-24 DIAGNOSIS — J189 Pneumonia, unspecified organism: Secondary | ICD-10-CM

## 2015-08-24 MED ORDER — PROMETHAZINE-CODEINE 6.25-10 MG/5ML PO SYRP: 5.0000 mL | ORAL_SOLUTION | Freq: Four times a day (QID) | ORAL | Status: DC | PRN

## 2015-08-24 NOTE — Progress Notes (Signed)
Subjective:    [name], female    DOB: 1946-05-10, 70 y.o.   MRN: [id number]  HPI 70 yo never smoker with AR, AB and RA  08/24/2015 Acute OV  (Followed for allergic rhinitis, rhinosinusitis, asthmatic bronchitis, complicated by  GERD/ RA  ) Pt returns for persistent symptoms of cough, congestion , drainage and wheezing.  She has been sick since 08/06/15 , initially took Doxycycline with minimal improvement. Came to office on 08/10/15 , tx w/ Augmentin and Prednisone burst . She says she is feeling some better but still has cough , worse at night. Congestion was initially yellow to green but now cough is mainly dry.   Denies any nausea or vomiting.  She did take Methotrexate recently with GI reaction . Took for about 3 mont hs. Has been off for 4 weeks.  Prednisone 29m daily -used for RA per rheumatology alothough she tells me today she is not sure if she has been dx with RA or Crohns .  Repeat CXR today shows mild increased interstitial prominence .  Denies chest pain, orthopnea, n/v/d or hemoptysis or increased edema.  Taking mucinex DM without much help.   Past Medical History  Diagnosis Date  . Esophageal reflux   . Acute asthmatic bronchitis   . Rheumatoid arthritis(714.0)   . Allergic rhinitis    Current Outpatient Prescriptions on File Prior to Visit  Medication Sig Dispense Refill  . albuterol (PROAIR HFA) 108 (90 BASE) MCG/ACT inhaler Inhale 2 puffs into the lungs every 4 (four) hours as needed for wheezing or shortness of breath. 1 Inhaler 3  . albuterol (PROVENTIL) (2.5 MG/3ML) 0.083% nebulizer solution Take 3 mLs (2.5 mg total) by nebulization every 6 (six) hours as needed for wheezing or shortness of breath. 75 mL 5  . aspirin EC 81 MG tablet Take 81 mg by mouth daily.    . benzonatate (TESSALON) 100 MG capsule TAKE 1 CAPSULE (100 MG TOTAL) BY MOUTH 4 (FOUR) TIMES DAILY AS NEEDED FOR COUGH. 50 capsule 5  . colestipol (COLESTID) 1 G tablet Take 1 tablet in am  and 1 tablet in pm.    . DiphenhydrAMINE HCl, Sleep, (UNISOM SLEEPMELTS PO) Take by mouth at bedtime.    . ergocalciferol (VITAMIN D2) 50000 UNITS capsule Take 50,000 Units by mouth every 14 (fourteen) days.      . fluconazole (DIFLUCAN) 150 MG tablet 1 every other day 10 tablet 1  . FLUoxetine (PROZAC) 20 MG tablet Take 20 mg by mouth daily.      . fluticasone (FLONASE) 50 MCG/ACT nasal spray Place 2 sprays into the nose daily.     . furosemide (LASIX) 20 MG tablet Take 40 mg by mouth daily.     .Marland Kitchenglycopyrrolate (ROBINUL) 1 MG tablet Take 1 mg by mouth 3 (three) times daily as needed (for IBS).     .Marland KitchenguaiFENesin (MUCINEX) 600 MG 12 hr tablet Take 1,200 mg by mouth 2 (two) times daily as needed.    . Insulin Syringe-Needle U-100 (B-D INSULIN SYRINGE 1CC/25G) 25G X 5/8" 1 ML MISC Use as directed weekly for allergy injections 100 each 0  . ipratropium (ATROVENT) 0.03 % nasal spray Place 1-2 sprays into both nostrils 2 (two) times daily. 90 mL 3  . Menthol, Topical Analgesic, (BLUE-EMU MAXIMUM STRENGTH EX) Apply 1 application topically daily as needed (for hip, back pain).    . mirtazapine (REMERON) 15 MG tablet Take 45 mg by mouth at bedtime.     .Marland Kitchen  montelukast (SINGULAIR) 10 MG tablet Take 1 tablet (10 mg total) by mouth at bedtime. 90 tablet 3  . NONFORMULARY OR COMPOUNDED ITEM Allergy Vaccine 1:10 Given at Home    . omeprazole (PRILOSEC) 40 MG capsule Take 40 mg by mouth daily.    . potassium chloride SA (K-DUR,KLOR-CON) 20 MEQ tablet Take 20 mEq by mouth 3 (three) times daily.    . pravastatin (PRAVACHOL) 40 MG tablet Take 40 mg by mouth daily.     . predniSONE (DELTASONE) 5 MG tablet 1-2 tabs once daily x 10 days (Patient taking differently: Take 1 tablet by mouth daily) 20 tablet 0  . promethazine-codeine (PHENERGAN WITH CODEINE) 6.25-10 MG/5ML syrup Take 5 mLs by mouth every 6 (six) hours as needed for cough. 200 mL 0  . traMADol (ULTRAM) 50 MG tablet Take 50 mg by mouth every 8 (eight)  hours as needed for pain.    Marland Kitchen azithromycin (ZITHROMAX) 250 MG tablet Maintenance, 3 days per week as directed (Patient not taking: Reported on 08/24/2015) 25 each 5  . celecoxib (CELEBREX) 200 MG capsule Take 1 capsule by mouth daily. Reported on 08/24/2015    . fexofenadine (ALLEGRA) 180 MG tablet Take 90 mg by mouth at bedtime. Reported on 08/24/2015    . predniSONE (DELTASONE) 10 MG tablet 2 tabs for 1 week , 1 tab for 1 week and 1/2 daily (Patient not taking: Reported on 08/24/2015) 60 tablet 0   No current facility-administered medications on file prior to visit.      Complains of  ROS-see HPI Constitutional:   No-   weight loss, night sweats, fevers, chills, fatigue, lassitude. HEENT:   No-  headaches, difficulty swallowing, tooth/dental problems, sore throat,       No-  sneezing, itching, ear ache, nasal congestion, +post nasal drip,  CV:  No-   chest pain, orthopnea, PND, swelling in lower extremities, anasarca,                                  dizziness, palpitations Resp: No-   shortness of breath with exertion or at rest.              +  productive cough,  No non-productive cough,  No- coughing up of blood.              No-   change in color of mucus.  No- wheezing.   Skin: No-   rash or lesions. GI:  No-   heartburn, indigestion, abdominal pain, nausea, vomiting,  GU:  MS:  No-   joint pain or swelling.   Neuro-     nothing unusual Psych:  No- change in mood or affect. No depression or anxiety.  No memory loss.  Objective:  OBJ- Physical Exam  Filed Vitals:   08/24/15 1152  BP: 132/88  Pulse: 75  Temp: 97.5 F (36.4 C)  TempSrc: Oral  Height: _0  (1.549 m)  Weight: 169 lb (76.658 kg)  SpO2: 93%    GEN: A/Ox3; pleasant , NAD, elderly , no coughing today during exam.   HEENT:  [place]/AT,  EACs-clear, TMs-wnl, NOSE-clear, THROAT-clear, no lesions, no postnasal drip or exudate noted.   NECK:  Supple w/ fair ROM; no JVD; normal carotid impulses w/o bruits; no thyromegaly or  nodules palpated; no lymphadenopathy.  RESP  Decreased BS in bases , no wheezing or rhonchi ,  no accessory muscle use, no dullness  to percussion  CARD:  RRR, no m/r/g  , no peripheral edema, pulses intact, no cyanosis or clubbing.  GI:   Soft & nt; nml bowel sounds; no organomegaly or masses detected.  Musco: Warm bil, no deformities , arthritic changes in hands.   Neuro: alert, no focal deficits noted.    Skin: Warm, no lesions or rashes  CXR 08/24/2015 reviewed independently  Mild interstiital prominence persists

## 2015-08-24 NOTE — Assessment & Plan Note (Signed)
Slow to resolve flare -may just be slow to resolve bronchitis with upper airway cough  CXR is not overly impressive with increased interstitial markings  She has been on 2 different abx , will hold on additional abx at this time.  Check CT chest and sinus  Case and xray discussed and reviewed with Dr. Annamaria Boots   Hold steroids 28m for additional 2 weeks. Then resume 567mdaily    Plan  Set up for HRCT Chest  and Sinus CT  Remain on prednisone1067maily for 2 weeks then go back 5mg76mily  Mucinex DM Twice daily  As needed  Cough/congestion  Phenergan VC with codeine As needed cough , may make your cough worse.  Follow up Dr. YounAnnamaria Boots 3 weeks and As needed   Please contact office for sooner follow up if symptoms do not improve or worsen or seek emergency care

## 2015-08-24 NOTE — Patient Instructions (Addendum)
Set up for HRCT Chest  and Sinus CT  Remain on prednisone64m daily for 2 weeks then go back 556mdaily  Mucinex DM Twice daily  As needed  Cough/congestion  Phenergan VC with codeine As needed cough , may make your cough worse.  Follow up Dr. YoAnnamaria BootsIn 3 weeks and As needed   Please contact office for sooner follow up if symptoms do not improve or worsen or seek emergency care

## 2015-08-26 DIAGNOSIS — M25441 Effusion, right hand: Secondary | ICD-10-CM | POA: Diagnosis not present

## 2015-08-26 DIAGNOSIS — M0609 Rheumatoid arthritis without rheumatoid factor, multiple sites: Secondary | ICD-10-CM | POA: Diagnosis not present

## 2015-08-26 DIAGNOSIS — M25442 Effusion, left hand: Secondary | ICD-10-CM | POA: Diagnosis not present

## 2015-08-26 DIAGNOSIS — R05 Cough: Secondary | ICD-10-CM | POA: Diagnosis not present

## 2015-08-26 DIAGNOSIS — M545 Low back pain: Secondary | ICD-10-CM | POA: Diagnosis not present

## 2015-08-26 DIAGNOSIS — Z79899 Other long term (current) drug therapy: Secondary | ICD-10-CM | POA: Diagnosis not present

## 2015-08-26 DIAGNOSIS — M797 Fibromyalgia: Secondary | ICD-10-CM | POA: Diagnosis not present

## 2015-08-27 ENCOUNTER — Ambulatory Visit (INDEPENDENT_AMBULATORY_CARE_PROVIDER_SITE_OTHER)
Admission: RE | Admit: 2015-08-27 | Discharge: 2015-08-27 | Disposition: A | Discharge status: Home / Self Care | Payer: Medicare Other | Source: Ambulatory Visit | Attending: Adult Health | Admitting: Adult Health

## 2015-08-27 DIAGNOSIS — R05 Cough: Secondary | ICD-10-CM | POA: Diagnosis not present

## 2015-08-27 DIAGNOSIS — J320 Chronic maxillary sinusitis: Secondary | ICD-10-CM | POA: Diagnosis not present

## 2015-08-27 DIAGNOSIS — J189 Pneumonia, unspecified organism: Secondary | ICD-10-CM

## 2015-08-27 DIAGNOSIS — J441 Chronic obstructive pulmonary disease with (acute) exacerbation: Secondary | ICD-10-CM | POA: Diagnosis not present

## 2015-08-27 DIAGNOSIS — J40 Bronchitis, not specified as acute or chronic: Secondary | ICD-10-CM | POA: Diagnosis not present

## 2015-08-28 ENCOUNTER — Other Ambulatory Visit: Payer: Self-pay | Admitting: Internal Medicine

## 2015-08-28 DIAGNOSIS — J302 Other seasonal allergic rhinitis: Secondary | ICD-10-CM

## 2015-08-28 DIAGNOSIS — J441 Chronic obstructive pulmonary disease with (acute) exacerbation: Secondary | ICD-10-CM

## 2015-08-28 DIAGNOSIS — J3089 Other allergic rhinitis: Secondary | ICD-10-CM

## 2015-08-28 NOTE — Progress Notes (Signed)
Quick Note:  Called and spoke with pt. Reviewed results and recs. Pt voiced understanding and had no further questions. ______ 

## 2015-08-28 NOTE — Progress Notes (Signed)
Quick Note:  Called and spoke with the patient. Reviewed results and recs. Pt voiced understanding and had no further questions. PFT scheduled for 08/31/15 at 4pm. ______

## 2015-08-31 ENCOUNTER — Ambulatory Visit: Payer: Medicare Other | Admitting: Adult Health

## 2015-09-03 ENCOUNTER — Ambulatory Visit (INDEPENDENT_AMBULATORY_CARE_PROVIDER_SITE_OTHER): Payer: Medicare Other | Admitting: Internal Medicine

## 2015-09-03 DIAGNOSIS — J302 Other seasonal allergic rhinitis: Secondary | ICD-10-CM

## 2015-09-03 DIAGNOSIS — J309 Allergic rhinitis, unspecified: Secondary | ICD-10-CM | POA: Diagnosis not present

## 2015-09-03 DIAGNOSIS — J3089 Other allergic rhinitis: Secondary | ICD-10-CM

## 2015-09-03 DIAGNOSIS — J441 Chronic obstructive pulmonary disease with (acute) exacerbation: Secondary | ICD-10-CM

## 2015-09-03 LAB — PULMONARY FUNCTION TEST
DL/VA % pred: 94 %
DL/VA: 4.14 ml/min/mmHg/L
DLCO cor % pred: 69 %
DLCO cor: 14.06 ml/min/mmHg
DLCO unc % pred: 73 %
DLCO unc: 14.82 ml/min/mmHg
FEF 25-75 Post: 1.75 L/sec
FEF 25-75 Pre: 1.62 L/sec
FEF2575-%Change-Post: 7 %
FEF2575-%Pred-Post: 105 %
FEF2575-%Pred-Pre: 97 %
FEV1-%Change-Post: 3 %
FEV1-%Pred-Post: 92 %
FEV1-%Pred-Pre: 88 %
FEV1-Post: 1.8 L
FEV1-Pre: 1.73 L
FEV1FVC-%Change-Post: 4 %
FEV1FVC-%Pred-Pre: 104 %
FEV6-%Change-Post: 1 %
FEV6-%Pred-Post: 88 %
FEV6-%Pred-Pre: 87 %
FEV6-Post: 2.17 L
FEV6-Pre: 2.15 L
FEV6FVC-%Change-Post: 1 %
FEV6FVC-%Pred-Post: 105 %
FEV6FVC-%Pred-Pre: 103 %
FVC-%Change-Post: 0 %
FVC-%Pred-Post: 83 %
FVC-%Pred-Pre: 84 %
FVC-Post: 2.17 L
FVC-Pre: 2.18 L
Post FEV1/FVC ratio: 83 %
Post FEV6/FVC ratio: 100 %
Pre FEV1/FVC ratio: 79 %
Pre FEV6/FVC Ratio: 98 %
RV % pred: 61 %
RV: 1.28 L
TLC % pred: 78 %
TLC: 3.63 L

## 2015-09-03 NOTE — Progress Notes (Signed)
PFT done today. 

## 2015-09-09 ENCOUNTER — Other Ambulatory Visit: Payer: Self-pay | Admitting: Internal Medicine

## 2015-09-14 ENCOUNTER — Encounter: Payer: Self-pay | Admitting: Internal Medicine

## 2015-09-14 ENCOUNTER — Ambulatory Visit (INDEPENDENT_AMBULATORY_CARE_PROVIDER_SITE_OTHER): Payer: Medicare Other | Admitting: Internal Medicine

## 2015-09-14 VITALS — BP 130/76 | HR 93 | Ht 61.0 in | Wt 176.2 lb

## 2015-09-14 DIAGNOSIS — J320 Chronic maxillary sinusitis: Secondary | ICD-10-CM

## 2015-09-14 DIAGNOSIS — J309 Allergic rhinitis, unspecified: Secondary | ICD-10-CM

## 2015-09-14 DIAGNOSIS — J328 Other chronic sinusitis: Secondary | ICD-10-CM

## 2015-09-14 DIAGNOSIS — J849 Interstitial pulmonary disease, unspecified: Secondary | ICD-10-CM

## 2015-09-14 DIAGNOSIS — J3089 Other allergic rhinitis: Secondary | ICD-10-CM

## 2015-09-14 DIAGNOSIS — J302 Other seasonal allergic rhinitis: Secondary | ICD-10-CM

## 2015-09-14 MED ORDER — AZELASTINE-FLUTICASONE 137-50 MCG/ACT NA SUSP: 1.0000 | Freq: Every day | NASAL | Status: DC

## 2015-09-14 MED ORDER — PROMETHAZINE-CODEINE 6.25-10 MG/5ML PO SYRP: 5.0000 mL | ORAL_SOLUTION | Freq: Four times a day (QID) | ORAL | Status: DC | PRN

## 2015-09-14 NOTE — Patient Instructions (Addendum)
Ok to continue maintenance prophyllactic zithromax 3 days per week   Order- referral to ENT Dr Wilburn Cornelia for chronic maxillary sinusitis  Order- schedule future High Res CT chest, no contrast, in 6 months-    Dx Interstitial lung disease  Ok to stop prednisone or change dose, as directed by Dr Charlestine Night

## 2015-09-14 NOTE — Progress Notes (Signed)
Subjective:    [name], female    DOB: Apr 27, 1946, 70 y.o.   MRN: [id number]  HPI 02/03/11- 4 yoF never smoker followed for allergic rhinitis, rhinosinusitis, asthmatic bronchitis, complicated by , GERD, rheumatoid arthritis. Last here August, 3, 2011 She has has done much better since last here- maybe because she is past the turmoil of moving last year. Not needing her nebulizer recently. Rescue inhaler used less than once/ week. Talked about the death of her aged mother.  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   02/06/12- 64 yoF followed for allergic rhinitis, rhinosinusitis, asthmatic bronchitis, complicated by , GERD, rheumatoid arthritis   Follows for: takes vaccine and has some sinus drainage when she eats.  She has responded a couple of x2 a Z-Pak when needed. Overall much better since she moved to her new home 2 years ago. Uses her rescue inhaler at bedtime. Less nighttime cough she lies on her side. 5 grandchildren stay with her, which is a stress and the source of colds. Watery rhinorrhea when she is.Troches have worked well for yeast prn.   03/28/2012 Acute OV  Complains of prod cough with w/ green/yellow/white mucus, wheezing, increased SOB, chills x2.5weeks, worse x1day.  OTC not helping .  Cough is keeping her up at night.  No hemoptysis or chest pian .  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  Never smoker.   09/20/12- 66 yoF followed for allergic rhinitis, rhinosinusitis, asthmatic bronchitis, complicated by , GERD/ Crohn's, rheumatoid arthritis  Husband here. FOLLOWS FOR: still having headaches, sinus drainage, and dizzy. Was given Augmentin and cough syrup last week. Had fallen in shower in August 2013-had a hematoma and no broken bones. Now- increased SOB/ DOE x 3 weeks. Dry cough at night. Chronic globus. Mucus plug from nose.  CXR 09/10/12-  IMPRESSION:  No acute findings.  Original Report Authenticated By: Lorin Picket, M.D.  11/08/12- 66 yoF followed for allergic rhinitis, rhinosinusitis, asthmatic bronchitis, complicated by , GERD/ Crohn's, rheumatoid arthritis  Husband here. FOLLOWS JQD:UKRCV on allergy vaccine1:10 GO and doing well; no reactions. Has had flare up with allergies; for about 3 weeks now. Never really cleared completely after exacerbation at last visit when we gave cefdinir.Marland Kitchen Has had increasing cough with scant yellow green sputum starting about a week ago. Denies fever or sore throat. Occasional wheeze. Increased ankle edema. Dr. Arelia Sneddon increased her Lasix. She had fallen on ice during the winter and badly skinned her right pretibial area and left knee. We talked about being seen at a wound center. She is to see a neurologist about her falls. She complains of always feeling very tired and husband says she snores. I educated them on watching for sleep apnea. CXR 09/10/12 IMPRESSION:  No acute findings.  Original Report Authenticated By: Lorin Picket, M.D.  06/11/13-   05/10/13- 67 yoF followed for allergic rhinitis, rhinosinusitis, asthmatic bronchitis, complicated by , GERD/ Crohn's, rheumatoid arthritis  Husband here. Allergy vaccine1:10 GO FOLLOWS FOR:  Sinus pressure w/ PND and cough w/ mucus green in color x2 months.  Also, having abdominal pain and frequent urination Had flu vaccine. Describes 2 months increased sinus drainage with some green mucus, frontal headaches. No fever, ear pressure or wheezing. Still recovering from abrasion to legs when she fell last winter. Has been going to wound Center.  05/10/13- 67 yoF followed for allergic rhinitis, rhinosinusitis, asthmatic bronchitis, complicated by , GERD/ Crohn's, rheumatoid arthritis  Husband here.  Allergy vaccine1:10 GO  FOLLOWS FOR: Sinus pressure w/ PND and cough w/ mucus green in color x2 months. Also, having abdominal pain and frequent urination  Had flu vaccine. Describes 2 months increased sinus drainage with some  green mucus, frontal headaches. No fever, ear pressure or wheezing.  Still recovering from abrasion to legs when she fell last winter. Has been going to wound Center.  06/11/13- 67 yoF followed for allergic rhinitis, rhinosinusitis, asthmatic bronchitis, complicated by , GERD/ Crohn's, rheumatoid arthritis   c/o sinus congestion x 1 wk. feels like fluid in head and ears,no ear pain,coughing at night-yellow and green,feels like fever today,nasal  congestion -clear This began with an upper respiratory infection but has become focused in her head She continues allergy vaccine 1: 10 GO  07/31/13 Acute OV  Complains of prod cough with green/yellow mucus, increased SOB, wheezing, fever, body aches.  began Augmentin and steroids on 1/13, fever began last night.  symptoms worse last night.  Patient says she was seen by her family doctor on January 12 and recommend to start on a prednisone taper. She was also called in Augmentin and Phenergan With Codeine cough syrup, which she began yesterday. She complains of a productive cough with thick, yellow green mucus. She's had fever that began last night. MAXIMUM TEMPERATURE 101.5. She also has body aches. She did have the flu shot. Patient's husband has had similar symptoms, but milder She denies any nausea, vomiting, diarrhea, abdominal pain, rash, recent travel Patient is eating, but has a decreased appetite. She says that Phenergan, codeine cough syrup is not helping. She is coughing nonstop. She denies any frank, hemoptysis, calf pain, orthopnea, or edema. Did notice a speck of blood. This morning, when she coughed very hard. >refer to ER for admit for bilateral PNA   08/13/2013 [facility] follow up  Returns for a post hospital follow up . Was admitted for bilateral PNA. Reports is much improved since discharge.  Finished abx 2 days ago, still taking pred taper..  CXR today shows near complete resolution of bilateral aspdz. She is feeling much better.   Still has some weakness.  No fever, chest pain or edema.  Appetite is returning. No n/v//d.   08/30/13- 67 yoF followed for allergic rhinitis, rhinosinusitis, asthmatic bronchitis, complicated by , GERD/ Crohn's, rheumatoid arthritis   Husband here FOLLOWS FOR:  Increased sob, wheezing and cough with green mucus x3 days Acute visit, for 5 days of new cold after resolving bronchitis exacerbation a few weeks ago. Exposed to grandchildren who were sick. Immunosuppressed by her rheumatoid arthritis and Crohn's disease. Increased dry cough, throat uncomfortable, low-grade fever, nasal congestion with yellow/green mucus CXR 08/13/13 IMPRESSION:  Near-complete resolution of left effusion and basilar airspace  disease. No new abnormality.  Electronically Signed  By: Inge Rise M.D.  On: 08/13/2013 11:21  09/05/13- 76 yoF followed for allergic rhinitis, rhinosinusitis, asthmatic bronchitis, complicated by  GERD/ Crohn's, rheumatoid arthritis   Husband here ACUTE  VISIT: patient having low O2 levels, SOB, and fatigued-just wants to sleep. Not eating well at all. She had come off of prednisone. Glucose was 144. She denies fever , purulent sputum, chest pain. >>BREO rx   10/14/13 Acute OV - TP Complains of persistent symptoms.  Complains that she feels her breathing is getting worse. Wears out breath easily . Last ov with persistent desats, given BREO  . Did not feel this helped much at all.  Complains of prod cough with dark green  mucus, increased SOB, chest tightness x1 week.  Denies any f/c/s, nausea, vomiting, hemoptysis.  sats were 86% RA upon entering exam room.   Was called Doxycycline last week, finished on 3/27 Congestion is better but still having DOE . Slow to resolve flare now w/ persistent exertional hypoxemia  Check CXR today  Consider PFTs in future  Plan  Prednisone taper over next week  Chest xray today .  Begin Oxygen 2l/m with activity and At bedtime  Follow up Dr. Annamaria Boots  In 3-4 weeks and As needed  Please contact office for sooner follow up if symptoms do not improve or worsen or seek emergency care   11/12/13- 67 yoF followed for allergic rhinitis, rhinosinusitis, asthmatic bronchitis, complicated by  GERD/ Crohn's, rheumatoid arthritis   Husband here FOLLOWS FOR:  Cough has improved.  Now on 2 - 3 L O2 / [employer] sleep/ exertion. Needed oxygen after last visit, used for sleep and as needed. Green sputum has now cleared. Treating postnasal drip with Claritin and Mucinex DM. Continues allergy vaccine 1:10 GO CXR 10/14/13 IMPRESSION:  Stable chronic lung disease with bibasilar scarring/atelectasis.  Electronically Signed  By: Aletta Edouard M.D.  On: 10/14/2013 15:50  03/10/14-67 yoF followed for allergic rhinitis, rhinosinusitis, asthmatic bronchitis, complicated by  GERD/ Crohn's, rheumatoid arthritis   Husband here 4 month follow up.  Still has prod cough with clear to yellow mucus in the mornings and qhs.  DOE is unchanged. She complains that since pneumonia in January, she has had a foreign body sensation of "something hanging back there" behind her soft palate.no headache and not blowing much from her nose. No sore throat. She describes how a chiropractor felt something against her forehead and she blew out a small nodular and reticular structure she compares to  "grapes and leaves", with no change in how her nose or throat felt. She has been comfortable since stopping oxygen once she recovered from the pneumonia.   03/27/2014 Acute OV  (Followed for allergic rhinitis, rhinosinusitis, asthmatic bronchitis, complicated by  GERD/ Crohn's, OA ) Complains of  Productive cough, congestion w/ thick yellow and green mucus for 2 weeks . Marland Kitchen Has sore throat.  Was seen by ENT recently , told possible ? GERD, rx Prilosec 67m  . Still has sore throat.  Started taking some left over augmentin few days ago. Has not helped. Last dose of aumgentin this am.  More wheezing  and cough for last few days.  Complains of stress incontinence with coughing .  No fever, hemoptysis, chest pain, orthopnea, edema or n/v/d.  Wants refill of tussionex , cough is keeping her up at night .  Requests Diflucan rx as she gets frequent yeast infections on abx.   05/15/14- 67 yoF followed for allergic rhinitis, rhinosinusitis, asthmatic bronchitis, complicated by  GERD/ Crohn's, rheumatoid arthritis   Husband here FOLLOWS FOR: patient feels like something in her throat.   06/10/14- 89yoF followed for allergic rhinitis, rhinosinusitis, asthmatic bronchitis, complicated by  GERD/ Crohn's, rheumatoid arthritis    FOLLOWS FOR: wheezing, cough-yellow in color, SOB as well. ? fever as well. We sent Zpak and cough syrup 6 days ago. Continues allergy vaccine 1:10 GO  07/03/14-68 yoF followed for allergic rhinitis, rhinosinusitis, asthmatic bronchitis, complicated by  GERD/ Crohn's, rheumatoid arthritis Follows For: Finished Prednisone 2 days ago  - Still c/o prod cough ( green), some sob and wheezing,        husband here Still having some green sputum from chest and  may be from nose. Just finished prednisone 2 days ago. We discussed available tools. Husband thinks she is somewhat better. They understand her autoimmune problems with Crohn's disease and rheumatoid arthritis impact her resistance to infection. CXR 06/10/14 IMPRESSION: Stable chronic bronchitic changes. There is no evidence of pneumonia nor CHF. Electronically Signed  By: David Martinique  On: 06/10/2014 14:09  09/10/14- 83 yoF followed for allergic rhinitis, rhinosinusitis, asthmatic bronchitis, complicated by  GERD/ Crohn's, rheumatoid arthritis  husband here FOLLOWS FOR: Pt states she continues to have "clogged" feeling in her throat-patient started taking her Zithromax QD instead of M,W, F as Rx'd-felt this has helped clear things up as well. Patient has had 2 cortisone injections in her shoulder this month. That may be  why she is doing quite well. Took her Zithromax daily for acute illness, ending 1 week ago. Diet program-lost 10 pounds  05/18/15- 69 yoF followed for allergic rhinitis, rhinosinusitis, asthmatic bronchitis, complicated by  GERD/ Crohn's, rheumatoid arthritis/ prednisone  husband here allergy vaccine 1:10 GO              husband here                  had flu vaccine October 5 FOLLOWS FOR: still on vaccine and doing well. Pt states she has (every so often) that she is going to get sick-congestion in throat. Currently on prednisone taper for arthritis. Took Z-Pak last week and is returning to maintenance Zithromax 250 mg 3 days per week. She feels maintenance program has helped reduce bronchitis exacerbations this year. Has lost weight with diet and exercise   09/14/2015-70 year old female never smoker followed for allergic rhinitis, rhinosinusitis, asthmatic bronchitis, complicated by GERD/Crohn's, rheumatoid arthritis/Prednisone  Allergy vaccine 1: 10 GO Saw NP 08/10/2015 treated with Augmentin and prednisone burst back to 5 mg daily. Then on follow-up ordered HRCT chest and sinus CT. no complains of 1 day of sinus pressure, postnasal drip. Using Neti pot and nebulizer. Rheumatologist does not think she has RA but she has been on low-dose prednisone since October and feels it helps her. CT maxillofacial-08/27/2015 bilateral chronic maxillary sinusitis, question left mastoid effusion HRCT chest 08/27/15-  IMPRESSION: 1. The appearance of the lungs is compatible with interstitial lung disease. There is a rather strong craniocaudal gradient, however, there is no frank honeycombing at this time, and no prior studies available for comparison. Findings could reflect either nonspecific interstitial pneumonia (NSIP) or early usual interstitial pneumonia (UIP), and accordingly, repeat high-resolution chest CT is recommended in 12 months to assess for further temporal changes in the appearance of the  lung parenchyma. 2. Atherosclerosis, including left main and 3 vessel coronary artery disease. Assessment for potential risk factor modification, dietary therapy or pharmacologic therapy may be warranted, if clinically indicated. 3. Multiple tiny pulmonary nodules measuring 4 mm or less in size. These are nonspecific, but favored to be benign (likely subpleural lymph nodes). Attention at time of repeat high-resolution chest CT is recommended. Electronically Signed  By: Vinnie Langton M.D.  On: 08/27/2015 14:52 PFT 09/03/2015-mild obstructive airways disease, minimal restriction, mild diffusion defect. FVC 2.17/83%, FEV1 1.80/92%, ratio 0.83. No response to bronchodilator. TLC 78%, DLCO 73%.  ROS-see HPI Constitutional:   +  weight loss, night sweats, fevers, chills, fatigue, lassitude. HEENT:   No-  headaches, difficulty swallowing, tooth/dental problems, sore throat,       No-  sneezing, itching, ear ache, + nasal congestion, +post nasal drip,  CV:  No-   chest pain,  orthopnea, PND, swelling in lower extremities, anasarca,                                                      dizziness, palpitations Resp: +  shortness of breath with exertion or at rest.               productive cough, non-productive cough,  No- coughing up of blood.             change in color of mucus.  No- wheezing.   Skin: No-   rash or lesions. GI:  No-   heartburn, indigestion, abdominal pain, nausea, vomiting,  GU:  MS:  No-   joint pain or swelling.   Neuro-     nothing unusual Psych:  No- change in mood or affect. No depression or anxiety.  No memory loss.  Objective:  OBJ- Physical Exam  General- Alert, Oriented, Affect-appropriate, Distress- none acute Skin- rash-none, lesions- none, excoriation- none Lymphadenopathy- none Head- atraumatic            Eyes- Gross vision intact, PERRLA, conjunctivae and secretions clear            Ears- Hearing, canals-normal            Nose- Clear, no-Septal dev,  mucus, polyps, erosion, perforation             Throat- Mallampati II , mucosa-clear, drainage- none, tonsils-                                               atrophic, voice normal, +question light thrush Neck- flexible , trachea midline, no stridor , thyroid nl, carotid no bruit Chest - symmetrical excursion , unlabored           Heart/CV- RRR , no murmur , no gallop  , no rub, nl s1 s2                           - JVD- none , edema- none, stasis changes- none, varices- none           Lung-   cough-none, wheezing -none, clear to quiet auscultation           Chest wall-  Abd-  Br/ Gen/ Rectal- Not done, not indicated Extrem- +scar from treated wound Neuro- grossly intact to observation

## 2015-09-19 ENCOUNTER — Encounter: Payer: Self-pay | Admitting: Internal Medicine

## 2015-09-19 DIAGNOSIS — J849 Interstitial pulmonary disease, unspecified: Secondary | ICD-10-CM | POA: Insufficient documentation

## 2015-09-19 NOTE — Assessment & Plan Note (Signed)
Recent exacerbation. This has been a long-standing problem for her so I recommended ENT evaluation. She has seen Dr. Wilburn Cornelia in the past. 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

## 2015-09-19 NOTE — Assessment & Plan Note (Signed)
If she has osteoarthritis instead of rheumatoid arthritis, then any interstitial disease is less likely to be related to a connective tissue disease. Exact pattern and activity are not clear. I recommended we repeat a chest CT in 6 months rather than waiting a year. Plan-repeat CT chest in 6 months

## 2015-09-19 NOTE — Assessment & Plan Note (Signed)
Patient now reports that Dr.Truslow does not think she has rheumatoid arthritis. I discussed the importance of getting to the lowest dose of prednisone we can.

## 2015-09-19 NOTE — Assessment & Plan Note (Signed)
Okay to continue allergy vaccine another year. Sample Dymista nasal spray

## 2015-09-29 DIAGNOSIS — J329 Chronic sinusitis, unspecified: Secondary | ICD-10-CM | POA: Diagnosis not present

## 2015-09-29 DIAGNOSIS — J310 Chronic rhinitis: Secondary | ICD-10-CM | POA: Diagnosis not present

## 2015-09-29 DIAGNOSIS — J343 Hypertrophy of nasal turbinates: Secondary | ICD-10-CM | POA: Diagnosis not present

## 2015-09-29 DIAGNOSIS — R05 Cough: Secondary | ICD-10-CM | POA: Diagnosis not present

## 2015-10-05 ENCOUNTER — Other Ambulatory Visit: Payer: Self-pay | Admitting: Internal Medicine

## 2015-10-07 DIAGNOSIS — M0609 Rheumatoid arthritis without rheumatoid factor, multiple sites: Secondary | ICD-10-CM | POA: Diagnosis not present

## 2015-10-07 DIAGNOSIS — J329 Chronic sinusitis, unspecified: Secondary | ICD-10-CM | POA: Diagnosis not present

## 2015-10-07 DIAGNOSIS — M25442 Effusion, left hand: Secondary | ICD-10-CM | POA: Diagnosis not present

## 2015-10-07 DIAGNOSIS — M25441 Effusion, right hand: Secondary | ICD-10-CM | POA: Diagnosis not present

## 2015-10-07 DIAGNOSIS — M25519 Pain in unspecified shoulder: Secondary | ICD-10-CM | POA: Diagnosis not present

## 2015-10-15 ENCOUNTER — Telehealth: Payer: Self-pay | Admitting: Internal Medicine

## 2015-10-15 DIAGNOSIS — J309 Allergic rhinitis, unspecified: Secondary | ICD-10-CM | POA: Diagnosis not present

## 2015-10-15 NOTE — Telephone Encounter (Signed)
Allergy Serum Extract Date Mixed: 10/15/15 Vial: 1 Strength: 1:10 Here/Mail/Pick Up: mail Mixed By: tbs Last OV: 09/14/15 Pending OV: 03/24/16

## 2015-10-28 DIAGNOSIS — J329 Chronic sinusitis, unspecified: Secondary | ICD-10-CM | POA: Diagnosis not present

## 2015-10-28 DIAGNOSIS — R220 Localized swelling, mass and lump, head: Secondary | ICD-10-CM | POA: Diagnosis not present

## 2015-10-28 DIAGNOSIS — J302 Other seasonal allergic rhinitis: Secondary | ICD-10-CM | POA: Diagnosis not present

## 2015-10-28 DIAGNOSIS — J342 Deviated nasal septum: Secondary | ICD-10-CM | POA: Diagnosis not present

## 2015-10-28 DIAGNOSIS — J343 Hypertrophy of nasal turbinates: Secondary | ICD-10-CM | POA: Diagnosis not present

## 2015-11-11 DIAGNOSIS — M25442 Effusion, left hand: Secondary | ICD-10-CM | POA: Diagnosis not present

## 2015-11-11 DIAGNOSIS — M25441 Effusion, right hand: Secondary | ICD-10-CM | POA: Diagnosis not present

## 2015-11-11 DIAGNOSIS — I10 Essential (primary) hypertension: Secondary | ICD-10-CM | POA: Diagnosis not present

## 2015-11-11 DIAGNOSIS — M0609 Rheumatoid arthritis without rheumatoid factor, multiple sites: Secondary | ICD-10-CM | POA: Diagnosis not present

## 2015-11-11 DIAGNOSIS — Z79899 Other long term (current) drug therapy: Secondary | ICD-10-CM | POA: Diagnosis not present

## 2015-11-16 DIAGNOSIS — K529 Noninfective gastroenteritis and colitis, unspecified: Secondary | ICD-10-CM | POA: Diagnosis not present

## 2015-11-16 DIAGNOSIS — K219 Gastro-esophageal reflux disease without esophagitis: Secondary | ICD-10-CM | POA: Diagnosis not present

## 2015-11-16 DIAGNOSIS — Z86010 Personal history of colonic polyps: Secondary | ICD-10-CM | POA: Diagnosis not present

## 2015-11-19 ENCOUNTER — Ambulatory Visit: Payer: Medicare Other | Admitting: Internal Medicine

## 2015-12-21 DIAGNOSIS — Z79899 Other long term (current) drug therapy: Secondary | ICD-10-CM | POA: Diagnosis not present

## 2015-12-21 DIAGNOSIS — M0609 Rheumatoid arthritis without rheumatoid factor, multiple sites: Secondary | ICD-10-CM | POA: Diagnosis not present

## 2015-12-23 DIAGNOSIS — Z0000 Encounter for general adult medical examination without abnormal findings: Secondary | ICD-10-CM | POA: Diagnosis not present

## 2016-01-06 DIAGNOSIS — R600 Localized edema: Secondary | ICD-10-CM | POA: Diagnosis not present

## 2016-01-06 DIAGNOSIS — M25442 Effusion, left hand: Secondary | ICD-10-CM | POA: Diagnosis not present

## 2016-01-06 DIAGNOSIS — M0609 Rheumatoid arthritis without rheumatoid factor, multiple sites: Secondary | ICD-10-CM | POA: Diagnosis not present

## 2016-01-06 DIAGNOSIS — Z79899 Other long term (current) drug therapy: Secondary | ICD-10-CM | POA: Diagnosis not present

## 2016-01-06 DIAGNOSIS — M797 Fibromyalgia: Secondary | ICD-10-CM | POA: Diagnosis not present

## 2016-01-06 DIAGNOSIS — M7532 Calcific tendinitis of left shoulder: Secondary | ICD-10-CM | POA: Diagnosis not present

## 2016-01-06 DIAGNOSIS — M25441 Effusion, right hand: Secondary | ICD-10-CM | POA: Diagnosis not present

## 2016-01-08 ENCOUNTER — Telehealth: Payer: Self-pay | Admitting: Internal Medicine

## 2016-01-08 MED ORDER — DOXYCYCLINE HYCLATE 100 MG PO TABS: ORAL_TABLET | ORAL | Status: DC

## 2016-01-08 MED ORDER — PROMETHAZINE-CODEINE 6.25-10 MG/5ML PO SYRP: 5.0000 mL | ORAL_SOLUTION | Freq: Four times a day (QID) | ORAL | Status: DC | PRN

## 2016-01-08 NOTE — Telephone Encounter (Signed)
Offer prometh codeine cough syrup, 180 ml,    5 ml every 6 hours as needed for cough    Offer doxycyclien 100 mg, # 8, 2 today then one daily

## 2016-01-08 NOTE — Telephone Encounter (Signed)
Patient states she has been coughing and can't stop, back pain, aching joints, headache, sinus pressure, sore throat, yellow mucus, chest tightness.  Patient takes 32m Prednisone daily.    [facility]  Allergies  Allergen Reactions  . Lactose Intolerance (Gi) Diarrhea  . Nitrofuran Derivatives     shakiness  . Other Diarrhea    "lettuce only"  . Tdap [Diphth-Acell Pertussis-Tetanus]     Shaking uncontrollably  . Erythromycin Other (See Comments)    Diarrhea   . Tetanus Toxoids Other (See Comments)    Shaking uncontrollable     Current Outpatient Prescriptions on File Prior to Visit  Medication Sig Dispense Refill  . albuterol (PROAIR HFA) 108 (90 BASE) MCG/ACT inhaler Inhale 2 puffs into the lungs every 4 (four) hours as needed for wheezing or shortness of breath. 1 Inhaler 3  . albuterol (PROVENTIL) (2.5 MG/3ML) 0.083% nebulizer solution UE 1 VIAL IN NEBULIZER EVERY 6 HOURS AS NEEDED FOR WHEEZING OR SHORTNESS OF BREATH 75 mL 5  . aspirin EC 81 MG tablet Take 81 mg by mouth daily.    . Azelastine-Fluticasone (DYMISTA) 137-50 MCG/ACT SUSP Place 1-2 sprays into the nose at bedtime. 1 Bottle 0  . benzonatate (TESSALON) 100 MG capsule TAKE 1 CAPSULE (100 MG TOTAL) BY MOUTH 4 (FOUR) TIMES DAILY AS NEEDED FOR COUGH. 50 capsule 5  . celecoxib (CELEBREX) 200 MG capsule Take 1 capsule by mouth daily. Reported on 09/14/2015    . colestipol (COLESTID) 1 G tablet Take 1 tablet in am and 1 tablet in pm.    . DiphenhydrAMINE HCl, Sleep, (UNISOM SLEEPMELTS PO) Take by mouth at bedtime.    . ergocalciferol (VITAMIN D2) 50000 UNITS capsule Take 50,000 Units by mouth every 14 (fourteen) days.      .Marland KitchenFLUoxetine (PROZAC) 20 MG tablet Take 20 mg by mouth daily.      . fluticasone (FLONASE) 50 MCG/ACT nasal spray Place 2 sprays into the nose daily.     . furosemide (LASIX) 20 MG tablet Take 40 mg by mouth daily.     .Marland Kitchenglycopyrrolate (ROBINUL) 1 MG tablet Take 1 mg by mouth 3 (three)  times daily as needed (for IBS).     .Marland KitchenguaiFENesin (MUCINEX) 600 MG 12 hr tablet Take 1,200 mg by mouth 2 (two) times daily as needed.    . Insulin Syringe-Needle U-100 (B-D INSULIN SYRINGE 1CC/25G) 25G X 5/8" 1 ML MISC Use as directed weekly for allergy injections 100 each 0  . ipratropium (ATROVENT) 0.03 % nasal spray Place 1-2 sprays into both nostrils 2 (two) times daily. 90 mL 3  . Menthol, Topical Analgesic, (BLUE-EMU MAXIMUM STRENGTH EX) Apply 1 application topically daily as needed (for hip, back pain).    . mirtazapine (REMERON) 15 MG tablet Take 45 mg by mouth at bedtime.     . montelukast (SINGULAIR) 10 MG tablet Take 1 tablet (10 mg total) by mouth at bedtime. 90 tablet 3  . NONFORMULARY OR COMPOUNDED ITEM Allergy Vaccine 1:10 Given at Home    . omeprazole (PRILOSEC) 40 MG capsule Take 40 mg by mouth daily.    . potassium chloride SA (K-DUR,KLOR-CON) 20 MEQ tablet Take 20 mEq by mouth 3 (three) times daily.    . pravastatin (PRAVACHOL) 40 MG tablet Take 40 mg by mouth daily.     . predniSONE (DELTASONE) 10 MG tablet 2 tabs for 1 week , 1 tab for 1 week and 1/2 daily 60 tablet 0  .  promethazine-codeine (PHENERGAN WITH CODEINE) 6.25-10 MG/5ML syrup Take 5 mLs by mouth every 6 (six) hours as needed for cough. 200 mL 0  . traMADol (ULTRAM) 50 MG tablet Take 50 mg by mouth every 8 (eight) hours as needed for pain.     No current facility-administered medications on file prior to visit.

## 2016-01-08 NOTE — Telephone Encounter (Signed)
Rx's were sent to pharm Pt aware and nothing further needed

## 2016-01-14 ENCOUNTER — Telehealth: Payer: Self-pay | Admitting: Internal Medicine

## 2016-01-14 NOTE — Telephone Encounter (Signed)
Pt will be seen 01-21-16 at 10am with SG.

## 2016-01-14 NOTE — Telephone Encounter (Signed)
Pt c/o continued cough with light green mucus, pnd, very tired with frequent naps, some wheeze. Pt has finished abx. Pt does still have some cough syrup and takes that at night. Pt has not used albuterol inhaler. Advised pt on albuterol use for wheezing. Pt denies Cp/tightness or f/n/v. Pt also reports sore throat has improved. Pt would like to know what else she can take.  CY Please advise. Thanks!   Allergies  Allergen Reactions  . Lactose Intolerance (Gi) Diarrhea  . Nitrofuran Derivatives     shakiness  . Other Diarrhea    "lettuce only"  . Tdap [Diphth-Acell Pertussis-Tetanus]     Shaking uncontrollably  . Erythromycin Other (See Comments)    Diarrhea   . Tetanus Toxoids Other (See Comments)    Shaking uncontrollable      Current Outpatient Prescriptions on File Prior to Visit  Medication Sig Dispense Refill  . albuterol (PROAIR HFA) 108 (90 BASE) MCG/ACT inhaler Inhale 2 puffs into the lungs every 4 (four) hours as needed for wheezing or shortness of breath. 1 Inhaler 3  . albuterol (PROVENTIL) (2.5 MG/3ML) 0.083% nebulizer solution UE 1 VIAL IN NEBULIZER EVERY 6 HOURS AS NEEDED FOR WHEEZING OR SHORTNESS OF BREATH 75 mL 5  . aspirin EC 81 MG tablet Take 81 mg by mouth daily.    . Azelastine-Fluticasone (DYMISTA) 137-50 MCG/ACT SUSP Place 1-2 sprays into the nose at bedtime. 1 Bottle 0  . benzonatate (TESSALON) 100 MG capsule TAKE 1 CAPSULE (100 MG TOTAL) BY MOUTH 4 (FOUR) TIMES DAILY AS NEEDED FOR COUGH. 50 capsule 5  . celecoxib (CELEBREX) 200 MG capsule Take 1 capsule by mouth daily. Reported on 09/14/2015    . colestipol (COLESTID) 1 G tablet Take 1 tablet in am and 1 tablet in pm.    . DiphenhydrAMINE HCl, Sleep, (UNISOM SLEEPMELTS PO) Take by mouth at bedtime.    Marland Kitchen doxycycline (VIBRA-TABS) 100 MG tablet 2 today, then 1 daily until gone 8 tablet 0  . ergocalciferol (VITAMIN D2) 50000 UNITS capsule Take 50,000 Units by mouth every 14 (fourteen) days.      Marland Kitchen FLUoxetine  (PROZAC) 20 MG tablet Take 20 mg by mouth daily.      . fluticasone (FLONASE) 50 MCG/ACT nasal spray Place 2 sprays into the nose daily.     . furosemide (LASIX) 20 MG tablet Take 40 mg by mouth daily.     Marland Kitchen glycopyrrolate (ROBINUL) 1 MG tablet Take 1 mg by mouth 3 (three) times daily as needed (for IBS).     Marland Kitchen guaiFENesin (MUCINEX) 600 MG 12 hr tablet Take 1,200 mg by mouth 2 (two) times daily as needed.    . Insulin Syringe-Needle U-100 (B-D INSULIN SYRINGE 1CC/25G) 25G X 5/8" 1 ML MISC Use as directed weekly for allergy injections 100 each 0  . ipratropium (ATROVENT) 0.03 % nasal spray Place 1-2 sprays into both nostrils 2 (two) times daily. 90 mL 3  . Menthol, Topical Analgesic, (BLUE-EMU MAXIMUM STRENGTH EX) Apply 1 application topically daily as needed (for hip, back pain).    . mirtazapine (REMERON) 15 MG tablet Take 45 mg by mouth at bedtime.     . montelukast (SINGULAIR) 10 MG tablet Take 1 tablet (10 mg total) by mouth at bedtime. 90 tablet 3  . NONFORMULARY OR COMPOUNDED ITEM Allergy Vaccine 1:10 Given at Home    . omeprazole (PRILOSEC) 40 MG capsule Take 40 mg by mouth daily.    . potassium chloride SA (K-DUR,KLOR-CON) 20  MEQ tablet Take 20 mEq by mouth 3 (three) times daily.    . pravastatin (PRAVACHOL) 40 MG tablet Take 40 mg by mouth daily.     . predniSONE (DELTASONE) 10 MG tablet 2 tabs for 1 week , 1 tab for 1 week and 1/2 daily 60 tablet 0  . promethazine-codeine (PHENERGAN WITH CODEINE) 6.25-10 MG/5ML syrup Take 5 mLs by mouth every 6 (six) hours as needed for cough. 200 mL 0  . traMADol (ULTRAM) 50 MG tablet Take 50 mg by mouth every 8 (eight) hours as needed for pain.     No current facility-administered medications on file prior to visit.

## 2016-01-14 NOTE — Telephone Encounter (Signed)
Suggest she see an NP for her on-going bronchitis

## 2016-01-21 ENCOUNTER — Ambulatory Visit (INDEPENDENT_AMBULATORY_CARE_PROVIDER_SITE_OTHER)
Admission: RE | Admit: 2016-01-21 | Discharge: 2016-01-21 | Disposition: A | Discharge status: Home / Self Care | Payer: Medicare Other | Source: Ambulatory Visit | Attending: Acute Care | Admitting: Acute Care

## 2016-01-21 ENCOUNTER — Encounter: Payer: Self-pay | Admitting: Acute Care

## 2016-01-21 ENCOUNTER — Other Ambulatory Visit (INDEPENDENT_AMBULATORY_CARE_PROVIDER_SITE_OTHER): Payer: Medicare Other

## 2016-01-21 ENCOUNTER — Telehealth: Payer: Self-pay | Admitting: Acute Care

## 2016-01-21 ENCOUNTER — Ambulatory Visit (INDEPENDENT_AMBULATORY_CARE_PROVIDER_SITE_OTHER): Payer: Medicare Other | Admitting: Acute Care

## 2016-01-21 VITALS — BP 120/82 | HR 74 | Ht 61.0 in | Wt 184.0 lb

## 2016-01-21 DIAGNOSIS — R0602 Shortness of breath: Secondary | ICD-10-CM

## 2016-01-21 DIAGNOSIS — R05 Cough: Secondary | ICD-10-CM | POA: Diagnosis not present

## 2016-01-21 DIAGNOSIS — J209 Acute bronchitis, unspecified: Secondary | ICD-10-CM

## 2016-01-21 LAB — CBC WITH DIFFERENTIAL/PLATELET
Basophils Absolute: 0 10*3/uL (ref 0.0–0.1)
Basophils Relative: 0.4 % (ref 0.0–3.0)
Eosinophils Absolute: 0.2 10*3/uL (ref 0.0–0.7)
Eosinophils Relative: 1.7 % (ref 0.0–5.0)
HCT: 43.1 % (ref 36.0–46.0)
Hemoglobin: 14.3 g/dL (ref 12.0–15.0)
Lymphocytes Relative: 15.3 % (ref 12.0–46.0)
Lymphs Abs: 1.8 10*3/uL (ref 0.7–4.0)
MCHC: 33.2 g/dL (ref 30.0–36.0)
MCV: 90.4 fl (ref 78.0–100.0)
Monocytes Absolute: 0.8 10*3/uL (ref 0.1–1.0)
Monocytes Relative: 7.3 % (ref 3.0–12.0)
Neutro Abs: 8.7 10*3/uL — ABNORMAL HIGH (ref 1.4–7.7)
Neutrophils Relative %: 75.3 % (ref 43.0–77.0)
Platelets: 488 10*3/uL — ABNORMAL HIGH (ref 150.0–400.0)
RBC: 4.77 Mil/uL (ref 3.87–5.11)
RDW: 13.8 % (ref 11.5–15.5)
WBC: 11.5 10*3/uL — ABNORMAL HIGH (ref 4.0–10.5)

## 2016-01-21 LAB — BRAIN NATRIURETIC PEPTIDE: Pro B Natriuretic peptide (BNP): 20 pg/mL (ref 0.0–100.0)

## 2016-01-21 MED ORDER — LEVOFLOXACIN 500 MG PO TABS
500.0000 mg | ORAL_TABLET | Freq: Every day | ORAL | Status: DC
Start: 2016-01-21 — End: 2016-03-24

## 2016-01-21 MED ORDER — BENZONATATE 100 MG PO CAPS: ORAL_CAPSULE | ORAL | Status: DC

## 2016-01-21 MED ORDER — PREDNISONE 10 MG PO TABS: ORAL_TABLET | ORAL | Status: DC

## 2016-01-21 MED ORDER — ALBUTEROL SULFATE HFA 108 (90 BASE) MCG/ACT IN AERS: 2.0000 | INHALATION_SPRAY | RESPIRATORY_TRACT | Status: DC | PRN

## 2016-01-21 NOTE — Assessment & Plan Note (Addendum)
Slow to resolve Bronchitis Treated with Doxy x 7 days without clearance Added Z Pack on her own but still sick. Plan: We will check a CXR today. CBC, BMET, BNP We will call you with results. We will refill your Proventil inhaler. We will refill your Tessalon CT Chest August 16th, 2017. Continue using you Mucixnex with a full glass of water  Please add your Zyrtec once daily Call Dr. Annamaria Boots when you are out of the phenergan with codeine cough syrup for refill. Levaquin 500 mg x 7 days. Take probiotic daily while on Antibiotics. Prednisone taper; 10 mg tablets: 4 tabs x 2 days, 3 tabs x 2 days, 2 tabs x 2 days 1 tab x 2 days then resume your 5 mg per day.. Follow up with Dr. Annamaria Boots in 1 month and as needed. Please contact office for sooner follow up if symptoms do not improve or worsen or seek emergency care

## 2016-01-21 NOTE — Telephone Encounter (Signed)
I have called the results of chest x-ray done today in the office to Mrs. [name]. I explained that the chest x-ray indicated no active disease . I told her to take the prednisone and antibiotics as we had discussed today in the office. I encouraged her to call Dr. Annamaria Boots for follow-up in the event she is not improving. She verbalized understanding and had no further questions.

## 2016-01-21 NOTE — Addendum Note (Signed)
Addended by: Virl Cagey on: 01/21/2016 11:11 AM   Modules accepted: Orders

## 2016-01-21 NOTE — Progress Notes (Signed)
History of Present Illness [name] is a 70 y.o. female  Never smoker followed by Dr. Annamaria Boots for allergic rhinitis,rhinosinusitis, asthmatic bronchitis,complicated by GERD and RA. She takes  prednisone 5 mg daily.    7/6/2017Acute OV: Pt states she has been sick with worsening cough for 2 weeks. She phoned the office on 01/08/2016 and Dr. Annamaria Boots phoned in Doxy x 7 days and Promethazine Codeine cough syrup.She states she has continued to have chest congestion and productive cough.Secretions are light green to yellow and states it looks like puss sometimes.She thinks she still has some sinus congestion.She states she thinks she has had a fever, but did not check her temperature.She states she has Worsening shortness of breath.She is impatient to get better because she is going on a cruise July 28th, 2017.She denies chest pain, orthopnea, leg or calf pain.She is compliant with her neb treatments, Flonase, Singulair,Mucinex DM, and Prilosec.  Tests   CXR: 08/24/2015 IMPRESSION: Mild bibasilar interstitial edema remains. No new opacity. No change in cardiac silhouette.  08/27/2015: HRCT IMPRESSION: 1. The appearance of the lungs is compatible with interstitial lung disease. There is a rather strong craniocaudal gradient, however, there is no frank honeycombing at this time, and no prior studies available for comparison. Findings could reflect either nonspecific interstitial pneumonia (NSIP) or early usual interstitial pneumonia (UIP), and accordingly, repeat high-resolution chest CT is recommended in 12 months to assess for further temporal changes in the appearance of the lung parenchyma. 2. Atherosclerosis, including left main and 3 vessel coronary artery disease. Assessment for potential risk factor modification, dietary therapy or pharmacologic therapy may be warranted, if clinically indicated. 3. Multiple tiny pulmonary nodules measuring 4 mm or less in size. These are nonspecific,  but favored to be benign (likely subpleural lymph nodes). Attention at time of repeat high-resolution chest CT is recommended.   Past medical hx Past Medical History  Diagnosis Date  . Esophageal reflux   . Acute asthmatic bronchitis   . Rheumatoid arthritis(714.0)   . Allergic rhinitis      Past surgical hx, Family hx, Social hx all reviewed.  Current Outpatient Prescriptions on File Prior to Visit  Medication Sig  . albuterol (PROAIR HFA) 108 (90 BASE) MCG/ACT inhaler Inhale 2 puffs into the lungs every 4 (four) hours as needed for wheezing or shortness of breath.  Marland Kitchen albuterol (PROVENTIL) (2.5 MG/3ML) 0.083% nebulizer solution UE 1 VIAL IN NEBULIZER EVERY 6 HOURS AS NEEDED FOR WHEEZING OR SHORTNESS OF BREATH  . aspirin EC 81 MG tablet Take 81 mg by mouth daily.  . Azelastine-Fluticasone (DYMISTA) 137-50 MCG/ACT SUSP Place 1-2 sprays into the nose at bedtime.  . benzonatate (TESSALON) 100 MG capsule TAKE 1 CAPSULE (100 MG TOTAL) BY MOUTH 4 (FOUR) TIMES DAILY AS NEEDED FOR COUGH.  . celecoxib (CELEBREX) 200 MG capsule Take 1 capsule by mouth daily. Reported on 09/14/2015  . colestipol (COLESTID) 1 G tablet Take 1 tablet in am and 1 tablet in pm.  . DiphenhydrAMINE HCl, Sleep, (UNISOM SLEEPMELTS PO) Take by mouth at bedtime.  . ergocalciferol (VITAMIN D2) 50000 UNITS capsule Take 50,000 Units by mouth every 14 (fourteen) days.    Marland Kitchen FLUoxetine (PROZAC) 20 MG tablet Take 20 mg by mouth daily.    . fluticasone (FLONASE) 50 MCG/ACT nasal spray Place 2 sprays into the nose daily.   . furosemide (LASIX) 20 MG tablet Take 40 mg by mouth daily.   Marland Kitchen glycopyrrolate (ROBINUL) 1 MG tablet Take 1 mg by  mouth 3 (three) times daily as needed (for IBS).   Marland Kitchen guaiFENesin (MUCINEX) 600 MG 12 hr tablet Take 1,200 mg by mouth 2 (two) times daily as needed.  . Insulin Syringe-Needle U-100 (B-D INSULIN SYRINGE 1CC/25G) 25G X 5/8" 1 ML MISC Use as directed weekly for allergy injections  . ipratropium  (ATROVENT) 0.03 % nasal spray Place 1-2 sprays into both nostrils 2 (two) times daily.  . Menthol, Topical Analgesic, (BLUE-EMU MAXIMUM STRENGTH EX) Apply 1 application topically daily as needed (for hip, back pain).  . mirtazapine (REMERON) 15 MG tablet Take 45 mg by mouth at bedtime.   . montelukast (SINGULAIR) 10 MG tablet Take 1 tablet (10 mg total) by mouth at bedtime.  . NONFORMULARY OR COMPOUNDED ITEM Allergy Vaccine 1:10 Given at Home  . omeprazole (PRILOSEC) 40 MG capsule Take 40 mg by mouth daily.  . potassium chloride SA (K-DUR,KLOR-CON) 20 MEQ tablet Take 20 mEq by mouth 3 (three) times daily.  . pravastatin (PRAVACHOL) 40 MG tablet Take 40 mg by mouth daily.   . predniSONE (DELTASONE) 10 MG tablet 2 tabs for 1 week , 1 tab for 1 week and 1/2 daily  . promethazine-codeine (PHENERGAN WITH CODEINE) 6.25-10 MG/5ML syrup Take 5 mLs by mouth every 6 (six) hours as needed for cough.  . traMADol (ULTRAM) 50 MG tablet Take 50 mg by mouth every 8 (eight) hours as needed for pain.   No current facility-administered medications on file prior to visit.     Allergies  Allergen Reactions  . Lactose Intolerance (Gi) Diarrhea  . Nitrofuran Derivatives     shakiness  . Other Diarrhea    "lettuce only"  . Tdap [Diphth-Acell Pertussis-Tetanus]     Shaking uncontrollably  . Erythromycin Other (See Comments)    Diarrhea   . Tetanus Toxoids Other (See Comments)    Shaking uncontrollable      Review Of Systems:  Constitutional:   No  weight loss, +night sweats,  +Fevers, +chills, fatigue, or  lassitude.  HEENT:   + headaches, No  Difficulty swallowing,  Tooth/dental problems, or  Sore throat,                No sneezing, itching, ear ache, + nasal congestion, + post nasal drip,   CV:  No chest pain,  Orthopnea, PND, + swelling in lower extremities, anasarca, dizziness, palpitations, syncope.   GI  No heartburn, indigestion, abdominal pain, nausea, vomiting, diarrhea, change in bowel  habits, loss of appetite, bloody stools.   Resp: + shortness of breath with exertion less at rest.  + excess mucus, + productive cough,  No non-productive cough,  No coughing up of blood.  + change in color of mucus.  + wheezing.  No chest wall deformity  Skin: no rash or lesions.  GU: no dysuria, change in color of urine, no urgency or frequency.  No flank pain, no hematuria   MS:  No joint pain or swelling.  No decreased range of motion.  No back pain.  Psych:  No change in mood or affect. No depression or anxiety.  No memory loss.   Vital Signs BP 120/82 mmHg  Pulse 74  Ht _0  (1.549 m)  Wt 184 lb (83.462 kg)  BMI 34.78 kg/m2  SpO2 95%   Physical Exam:  General- No distress,  A&Ox3, coughing ENT: No sinus tenderness, TM clear, pale nasal mucosa, no oral exudate,no post nasal drip, no LAN Cardiac: S1, S2, regular rate and rhythm, no  murmur Chest: + wheeze no  Rales/+  rhonchi; no accessory muscle use, no nasal flaring, no sternal retractions Abd.: Soft Non-tender Ext: No clubbing cyanosis, trace edema lower extremities. Neuro:  normal strength Skin: No rashes, warm and dry Psych: normal mood and behavior   Assessment/Plan  ASTHMATIC BRONCHITIS, ACUTE   Slow to resolve Bronchitis Treated with Doxy x 7 days without clearance Added Z Pack on her own but still sick. Plan: We will check a CXR today. CBC, BMET, BNP We will call you with results. We will refill your Proventil inhaler. We will refill your Tessalon CT Chest August 16th, 2017. Continue using you Mucixnex with a full glass of water  Please add your Zyrtec once daily Call Dr. Annamaria Boots when you are out of the phenergan with codeine cough syrup for refill. Levaquin 500 mg x 7 days. Take probiotic daily while on Antibiotics. Prednisone taper; 10 mg tablets: 4 tabs x 2 days, 3 tabs x 2 days, 2 tabs x 2 days 1 tab x 2 days then resume your 5 mg per day.. Follow up with Dr. Annamaria Boots in 1 month and as  needed. Please contact office for sooner follow up if symptoms do not improve or worsen or seek emergency care      Magdalen Spatz, NP 01/21/2016  10:52 AM

## 2016-01-21 NOTE — Patient Instructions (Addendum)
We will check a CXR today. CBC, BMET, BNP We will call you with results. We will refill your Proventil inhaler. We will refill your Tessalon CT Chest August 16th, 2017. Continue using you Mucixnex with a full glass of water  Levaquin 500 mg x 7 days.for both chest congestion and sinus drainage. Prednisone taper; 10 mg tablets: 4 tabs x 2 days, 3 tabs x 2 days, 2 tabs x 2 days 1 tab x 2 days then resume your 5 mg per day.. Follow up in 1 month with Dr. Annamaria Boots and as needed. Please contact office for sooner follow up if symptoms do not improve or worsen or seek emergency care

## 2016-01-25 DIAGNOSIS — Z0000 Encounter for general adult medical examination without abnormal findings: Secondary | ICD-10-CM | POA: Diagnosis not present

## 2016-01-25 DIAGNOSIS — M549 Dorsalgia, unspecified: Secondary | ICD-10-CM | POA: Diagnosis not present

## 2016-01-25 DIAGNOSIS — E785 Hyperlipidemia, unspecified: Secondary | ICD-10-CM | POA: Diagnosis not present

## 2016-01-25 DIAGNOSIS — I10 Essential (primary) hypertension: Secondary | ICD-10-CM | POA: Diagnosis not present

## 2016-01-25 DIAGNOSIS — F329 Major depressive disorder, single episode, unspecified: Secondary | ICD-10-CM | POA: Diagnosis not present

## 2016-01-28 ENCOUNTER — Telehealth: Payer: Self-pay | Admitting: Internal Medicine

## 2016-01-28 DIAGNOSIS — J309 Allergic rhinitis, unspecified: Secondary | ICD-10-CM | POA: Diagnosis not present

## 2016-01-28 NOTE — Telephone Encounter (Signed)
Allergy Serum Extract Date Mixed: 01/28/16 Vial: 1 Strength: 1:10 Here/Mail/Pick Up: mail Mixed By: tbs Last OV: 09/14/15 Pending OV: 03/24/16

## 2016-01-29 ENCOUNTER — Telehealth: Payer: Self-pay | Admitting: Internal Medicine

## 2016-01-29 MED ORDER — AMOXICILLIN-POT CLAVULANATE 500-125 MG PO TABS: 1.0000 | ORAL_TABLET | Freq: Two times a day (BID) | ORAL | Status: DC

## 2016-01-29 NOTE — Telephone Encounter (Signed)
This may not be a bacterial infection, but can try augmentin 500 mg, # 14, 1 twice daily  If she still has a Flutter device, this would be a good time to be using it- blow through 4 breaths per set, three sets daily, to help clear airways.

## 2016-01-29 NOTE — Telephone Encounter (Signed)
Called and spoke with pt and she stated that she has been sick x 3 weeks.  1st week she was given doxycycline and this did not clear it up.  2nd week was week of July 4th and she was not given anything, 3rd week she was seen by Eric Form and given Levofloxacin and she stated that she finished this yesterday.  She is still using the nebulizer and she is still coughing with light green sputum.  She stated that she feels whatever she has is not getting treated by the antibiotics that she has been given.  Pt stated that she is going on a cruise in 3 weeks and is wanting to be better.  CY please advise.  Thanks  Last ov--09/14/15 Next ov--03/24/16  Allergies  Allergen Reactions  . Lactose Intolerance (Gi) Diarrhea  . Nitrofuran Derivatives     shakiness  . Other Diarrhea    "lettuce only"  . Tdap [Diphth-Acell Pertussis-Tetanus]     Shaking uncontrollably  . Erythromycin Other (See Comments)    Diarrhea   . Tetanus Toxoids Other (See Comments)    Shaking uncontrollable

## 2016-01-29 NOTE — Telephone Encounter (Signed)
Patient notified of Dr. Janee Morn recommendations. Rx sent to pharmacy. Nothing further needed.

## 2016-02-03 DIAGNOSIS — M0609 Rheumatoid arthritis without rheumatoid factor, multiple sites: Secondary | ICD-10-CM | POA: Diagnosis not present

## 2016-02-03 DIAGNOSIS — M7532 Calcific tendinitis of left shoulder: Secondary | ICD-10-CM | POA: Diagnosis not present

## 2016-02-29 ENCOUNTER — Other Ambulatory Visit: Payer: Self-pay | Admitting: Gastroenterology

## 2016-02-29 DIAGNOSIS — K640 First degree hemorrhoids: Secondary | ICD-10-CM | POA: Diagnosis not present

## 2016-02-29 DIAGNOSIS — D123 Benign neoplasm of transverse colon: Secondary | ICD-10-CM | POA: Diagnosis not present

## 2016-02-29 DIAGNOSIS — D125 Benign neoplasm of sigmoid colon: Secondary | ICD-10-CM | POA: Diagnosis not present

## 2016-02-29 DIAGNOSIS — D126 Benign neoplasm of colon, unspecified: Secondary | ICD-10-CM | POA: Diagnosis not present

## 2016-02-29 DIAGNOSIS — Z86010 Personal history of colonic polyps: Secondary | ICD-10-CM | POA: Diagnosis not present

## 2016-02-29 DIAGNOSIS — K5730 Diverticulosis of large intestine without perforation or abscess without bleeding: Secondary | ICD-10-CM | POA: Diagnosis not present

## 2016-03-14 ENCOUNTER — Ambulatory Visit (INDEPENDENT_AMBULATORY_CARE_PROVIDER_SITE_OTHER)
Admission: RE | Admit: 2016-03-14 | Discharge: 2016-03-14 | Disposition: A | Discharge status: Home / Self Care | Payer: Medicare Other | Source: Ambulatory Visit | Attending: Internal Medicine | Admitting: Internal Medicine

## 2016-03-14 DIAGNOSIS — J849 Interstitial pulmonary disease, unspecified: Secondary | ICD-10-CM | POA: Diagnosis not present

## 2016-03-14 DIAGNOSIS — R918 Other nonspecific abnormal finding of lung field: Secondary | ICD-10-CM | POA: Diagnosis not present

## 2016-03-23 DIAGNOSIS — M0609 Rheumatoid arthritis without rheumatoid factor, multiple sites: Secondary | ICD-10-CM | POA: Diagnosis not present

## 2016-03-23 DIAGNOSIS — M7532 Calcific tendinitis of left shoulder: Secondary | ICD-10-CM | POA: Diagnosis not present

## 2016-03-24 ENCOUNTER — Ambulatory Visit (INDEPENDENT_AMBULATORY_CARE_PROVIDER_SITE_OTHER): Payer: Medicare Other | Admitting: Internal Medicine

## 2016-03-24 VITALS — BP 124/72 | HR 86 | Ht 61.0 in | Wt 175.0 lb

## 2016-03-24 DIAGNOSIS — Z8249 Family history of ischemic heart disease and other diseases of the circulatory system: Secondary | ICD-10-CM

## 2016-03-24 DIAGNOSIS — R0600 Dyspnea, unspecified: Secondary | ICD-10-CM

## 2016-03-24 DIAGNOSIS — R0609 Other forms of dyspnea: Secondary | ICD-10-CM | POA: Diagnosis not present

## 2016-03-24 DIAGNOSIS — Z23 Encounter for immunization: Secondary | ICD-10-CM

## 2016-03-24 DIAGNOSIS — J45998 Other asthma: Secondary | ICD-10-CM

## 2016-03-24 DIAGNOSIS — J849 Interstitial pulmonary disease, unspecified: Secondary | ICD-10-CM | POA: Diagnosis not present

## 2016-03-24 NOTE — Progress Notes (Signed)
Subjective:    [name], female    DOB: 01-Oct-1945, 70 y.o.   MRN: [id number]  HPI F never smoker followed for allergic rhinitis, rhinosinusitis, asthmatic bronchitis, complicated by , GERD, rheumatoid arthritis.   05/18/15- 69 yoF followed for allergic rhinitis, rhinosinusitis, asthmatic bronchitis, complicated by  GERD/ Crohn's, rheumatoid arthritis/ prednisone  husband here allergy vaccine 1:10 GO              husband here                  had flu vaccine October 5 FOLLOWS FOR: still on vaccine and doing well. Pt states she has (every so often) that she is going to get sick-congestion in throat. Currently on prednisone taper for arthritis. Took Z-Pak last week and is returning to maintenance Zithromax 250 mg 3 days per week. She feels maintenance program has helped reduce bronchitis exacerbations this year. Has lost weight with diet and exercise   09/14/2015-69 year old female never smoker followed for allergic rhinitis, rhinosinusitis, asthmatic bronchitis, complicated by GERD/Crohn's, rheumatoid arthritis/Prednisone  Allergy vaccine 1: 10 GO Saw NP 08/10/2015 treated with Augmentin and prednisone burst back to 5 mg daily. Then on follow-up ordered HRCT chest and sinus CT. no complains of 1 day of sinus pressure, postnasal drip. Using Neti pot and nebulizer. Rheumatologist does not think she has RA but she has been on low-dose prednisone since October and feels it helps her. CT maxillofacial-08/27/2015 bilateral chronic maxillary sinusitis, question left mastoid effusion HRCT chest 08/27/15-  IMPRESSION: 1. The appearance of the lungs is compatible with interstitial lung disease. There is a rather strong craniocaudal gradient, however, there is no frank honeycombing at this time, and no prior studies available for comparison. Findings could reflect either nonspecific interstitial pneumonia (NSIP) or early usual interstitial pneumonia (UIP), and accordingly, repeat  high-resolution chest CT is recommended in 12 months to assess for further temporal changes in the appearance of the lung parenchyma. 2. Atherosclerosis, including left main and 3 vessel coronary artery disease. Assessment for potential risk factor modification, dietary therapy or pharmacologic therapy may be warranted, if clinically indicated. 3. Multiple tiny pulmonary nodules measuring 4 mm or less in size. These are nonspecific, but favored to be benign (likely subpleural lymph nodes). Attention at time of repeat high-resolution chest CT is recommended. Electronically Signed  By: Vinnie Langton M.D.  On: 08/27/2015 14:52 PFT 09/03/2015-mild obstructive airways disease, minimal restriction, mild diffusion defect. FVC 2.17/83%, FEV1 1.80/92%, ratio 0.83. No response to bronchodilator. TLC 78%, DLCO 73%.  03/24/2016-70 year old female never smoker followed for allergic rhinitis, rhinosinusitis, asthmatic bronchitis, complicated by GERD/Crohn's, Rheumatoid arthritis/prednisone LOV NP 01/21/16-exacerbation of bronchitis with dyspnea beginning in June treated initially with doxycycline and cough syrup. FOLLOW FOR:  Went on cruise and had trouble breathing, had to ride in wheelchair during cruise. Couldn't catch her breath.  hyperventilating during sleep.   O2 sat today 90%. She denies acute respiratory infection and thinks she was just more active than usual on her trip. She is very sedentary because of arthritis problems. Now on Plaquenil , arava for RA, off MTX. We reviewed her CT scan CT chest High Resolution 03/14/2016 IMPRESSION: 1. Mild basilar predominant subpleural reticulation and traction bronchiolectasis, grossly stable, possibly due to nonspecific interstitial pneumonitis. Usual interstitial pneumonitis is not excluded. 2. Scattered tiny pulmonary nodules measure 4 mm or less in size, stable. 3. Aortic atherosclerosis and coronary artery calcification. Electronically  Signed   By: Lorin Picket M.D.  On: 03/14/2016 12:47  ROS-see HPI Constitutional:   +  weight loss, night sweats, fevers, chills, fatigue, lassitude. HEENT:   No-  headaches, difficulty swallowing, tooth/dental problems, sore throat,       No-  sneezing, itching, ear ache, + nasal congestion, +post nasal drip,  CV:  No-   chest pain, orthopnea, PND, swelling in lower extremities, anasarca,                                                      dizziness, palpitations Resp: +  shortness of breath with exertion or at rest.               productive cough, non-productive cough,  No- coughing up of blood.             change in color of mucus.  No- wheezing.   Skin: No-   rash or lesions. GI:  No-   heartburn, indigestion, abdominal pain, nausea, vomiting,  GU:  MS: +  joint pain or swelling.   Neuro-     nothing unusual Psych:  No- change in mood or affect. No depression or anxiety.  No memory loss.  Objective:  OBJ- Physical Exam  General- Alert, Oriented, Affect-appropriate, Distress- none acute, + Obese Skin- + stasis changes right lower leg Lymphadenopathy- none Head- atraumatic            Eyes- Gross vision intact, PERRLA, conjunctivae and secretions clear            Ears- Hearing, canals-normal            Nose- Clear, no-Septal dev, mucus, polyps, erosion, perforation             Throat- Mallampati II , mucosa-clear, drainage- none, tonsils-                                               atrophic, voice normal, +question light thrush Neck- flexible , trachea midline, no stridor , thyroid nl, carotid no bruit Chest - symmetrical excursion , unlabored           Heart/CV- RRR , no murmur , no gallop  , no rub, nl s1 s2                           - JVD- none , edema- none, stasis changes- none, varices- none           Lung-   cough-none, wheezing -none, clear to quiet auscultation           Chest wall-  Abd-  Br/ Gen/ Rectal- Not done, not indicated Extrem- +scar from treated  wound, + left ankle swollen. Neuro- grossly intact to observation

## 2016-03-24 NOTE — Patient Instructions (Addendum)
Order- refer to cardiology for CAD risk stratification- atherosclerosis, dyspnea on exertion, family history  I will be suggesting a repeat CT in a year to check on the fibrosis.   Flu vax-HIGH DOSE

## 2016-03-26 ENCOUNTER — Encounter: Payer: Self-pay | Admitting: Internal Medicine

## 2016-03-26 NOTE — Assessment & Plan Note (Signed)
Without recent acute exacerbation. Dyspnea on exertion is probably multifactorial with significant contribution from her obesity and deconditioning. She has family history of heart disease and atherosclerotic changes on chest CT. I suggested cardiology referral for coronary artery disease risk stratification. She and her husband thought this was a good idea.

## 2016-03-26 NOTE — Assessment & Plan Note (Signed)
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 Plan-anticipate follow-up CT scan in 1 year, not yet scheduled

## 2016-04-06 ENCOUNTER — Other Ambulatory Visit (HOSPITAL_COMMUNITY): Payer: Self-pay | Admitting: Rheumatology

## 2016-04-06 DIAGNOSIS — Z79899 Other long term (current) drug therapy: Secondary | ICD-10-CM | POA: Diagnosis not present

## 2016-04-06 DIAGNOSIS — M79605 Pain in left leg: Secondary | ICD-10-CM

## 2016-04-06 DIAGNOSIS — M0609 Rheumatoid arthritis without rheumatoid factor, multiple sites: Secondary | ICD-10-CM | POA: Diagnosis not present

## 2016-04-06 DIAGNOSIS — M2548 Effusion, other site: Secondary | ICD-10-CM | POA: Diagnosis not present

## 2016-04-06 DIAGNOSIS — M25472 Effusion, left ankle: Secondary | ICD-10-CM | POA: Diagnosis not present

## 2016-04-06 DIAGNOSIS — M7989 Other specified soft tissue disorders: Secondary | ICD-10-CM

## 2016-04-07 ENCOUNTER — Ambulatory Visit (HOSPITAL_COMMUNITY)
Admission: RE | Admit: 2016-04-07 | Discharge: 2016-04-07 | Disposition: A | Discharge status: Home / Self Care | Payer: Medicare Other | Source: Ambulatory Visit | Attending: Family Medicine | Admitting: Family Medicine

## 2016-04-07 ENCOUNTER — Encounter: Payer: Self-pay | Admitting: Internal Medicine

## 2016-04-07 DIAGNOSIS — M79605 Pain in left leg: Secondary | ICD-10-CM | POA: Diagnosis not present

## 2016-04-07 DIAGNOSIS — M7989 Other specified soft tissue disorders: Secondary | ICD-10-CM

## 2016-04-07 DIAGNOSIS — M2548 Effusion, other site: Secondary | ICD-10-CM | POA: Diagnosis not present

## 2016-04-07 DIAGNOSIS — M25472 Effusion, left ankle: Secondary | ICD-10-CM | POA: Insufficient documentation

## 2016-04-07 NOTE — Progress Notes (Signed)
VASCULAR LAB PRELIMINARY  PRELIMINARY  PRELIMINARY  PRELIMINARY  Left lower extremity venous duplex completed.    Preliminary report:  Left:  No obvious evidence of DVT, superficial thrombosis, or Baker's cyst.  Delane Stalling, RVS 04/07/2016, 9:38 AM

## 2016-04-22 ENCOUNTER — Ambulatory Visit (INDEPENDENT_AMBULATORY_CARE_PROVIDER_SITE_OTHER): Payer: Medicare Other | Admitting: Internal Medicine

## 2016-04-22 ENCOUNTER — Encounter: Payer: Self-pay | Admitting: Internal Medicine

## 2016-04-22 VITALS — BP 124/70 | HR 82 | Ht 61.0 in | Wt 193.2 lb

## 2016-04-22 DIAGNOSIS — I25119 Atherosclerotic heart disease of native coronary artery with unspecified angina pectoris: Secondary | ICD-10-CM | POA: Diagnosis not present

## 2016-04-22 DIAGNOSIS — R0602 Shortness of breath: Secondary | ICD-10-CM

## 2016-04-22 DIAGNOSIS — I209 Angina pectoris, unspecified: Secondary | ICD-10-CM

## 2016-04-22 NOTE — Progress Notes (Signed)
New Outpatient Visit Date: 04/22/2016  Referring Provider: Leonard Downing, MD [address] [place], [place] [address]  Chief Complaint  Patient presents with  . Shortness of Breath    HPI:  [name] is a 70 y.o. year-old female with history of asthmatic bronchitis, GERD, rheumatoid arthritis, CVA (small bilateral basal ganglia lacunar infarcts noted by brain MRI in 11/2012), Prediabetes with hemoglobin A1c of 6.0 and 2014, irritable bowel syndrome, and allergic rhinitis, who has been referred by Dr. Annamaria Boots for evaluation of exertional dyspnea. Patient has a long history of shortness of breath followed by Dr. Annamaria Boots. She recently underwent CT scan of the chest demonstrating findings concerning for some interstitial process. She has noticed over the last several months that her breathing has become significantly worse. Shortness of breath first became a significant problem for [name] in 2015 at the time of "double pneumonia and the flu." Her breathing had improved but never returned to normal. She went on a cruise last month and was unable to walk for any length of time. She required a wheelchair whenever she left shift due to shortness of breath and fatigue. She estimates that she is now able to walk only about 100 feet before needing to stop and catch her breath. She has not tried her albuterol inhaler when short of breath. She notes some occasional wheezing.  The patient denies chest pain. She endorses one episode of chest pressure many years ago, but has not had this since. She has chronic lower extremity edema, worse on the left. Due to increasing pain in the left calf she, she also recently underwent lower extremity venous duplex that was negative for DVT. She continues to have considerable swelling as well as pain involving the left ankle. She attributes this to her rheumatoid arthritis, for which she remains on prednisone 5 mg daily.  [name] denies palpitations but has  occasional lightheadedness when she gets very out of breath. She has not passed out. She reports at least 2 remote falls that resulted in lower extremity wounds that required lengthy treatment at the wound care center.  --------------------------------------------------------------------------------------------------  Cardiovascular History & Procedures: Cardiovascular Problems:  Dyspnea on exertion  Remote basal ganglia infarcts detected by MRI  Risk Factors:  History of stroke, borderline diabetes, hyperlipidemia, obesity, and chronic steroid use  Cath/PCI:  None  CV Surgery:  None  EP Procedures and Devices:  None  Non-Invasive Evaluation(s):  Bilateral lower extremity venous duplex (04/07/16): No evidence of DVT though left peroneal vein was suboptimally imaged.  Transthoracic echocardiogram (12/17/12): Normal LV size with abnormal septal motion and EF of 50%. Grade 1 diastolic dysfunction noted. Aortic sclerosis without stenosis or regurgitation evident. Trivial MR and TR without other significant valvular disease. Normal PA systolic and central venous pressures.  Bilateral lower extremity arterial duplex (12/05/12): Bilateral ABIs 1.2. No significant stenosis identified.  Recent CV Pertinent Labs: Lab Results  Component Value Date   INR 0.90 09/03/2010   K 4.2 09/05/2013   BUN 16 09/05/2013   BUN 12 11/15/2012   CREATININE 0.9 09/05/2013    --------------------------------------------------------------------------------------------------  Past Medical History:  Diagnosis Date  . Acute asthmatic bronchitis   . Allergic rhinitis   . Esophageal reflux   . Irritable bowel syndrome   . Rheumatoid arthritis(714.0)     Past Surgical History:  Procedure Laterality Date  . ABDOMINAL HYSTERECTOMY    . ANKLE FUSION  2009   left  . BACK SURGERY    .  CHOLECYSTECTOMY    . TOTAL KNEE ARTHROPLASTY Bilateral     Outpatient Encounter Prescriptions as of 04/22/2016    Medication Sig  . albuterol (PROAIR HFA) 108 (90 Base) MCG/ACT inhaler Inhale 2 puffs into the lungs every 4 (four) hours as needed for wheezing or shortness of breath.  Marland Kitchen albuterol (PROVENTIL) (2.5 MG/3ML) 0.083% nebulizer solution UE 1 VIAL IN NEBULIZER EVERY 6 HOURS AS NEEDED FOR WHEEZING OR SHORTNESS OF BREATH  . aspirin EC 81 MG tablet Take 81 mg by mouth daily.  . benzonatate (TESSALON) 100 MG capsule TAKE 1 CAPSULE (100 MG TOTAL) BY MOUTH 4 (FOUR) TIMES DAILY AS NEEDED FOR COUGH.  . cetirizine (ZYRTEC) 10 MG tablet Take 10 mg by mouth daily.  . colestipol (COLESTID) 1 G tablet Take 1 tablet in am and 1 tablet in pm.  . FLUoxetine (PROZAC) 20 MG tablet Take 20 mg by mouth daily.    . fluticasone (FLONASE) 50 MCG/ACT nasal spray Place 2 sprays into the nose daily.   . furosemide (LASIX) 20 MG tablet Take 40 mg by mouth daily.   Marland Kitchen glycopyrrolate (ROBINUL) 1 MG tablet Take 1 mg by mouth 3 (three) times daily as needed (for IBS).   . hydroxychloroquine (PLAQUENIL) 200 MG tablet Take 200 mg by mouth 3 (three) times daily.  . Insulin Syringe-Needle U-100 (B-D INSULIN SYRINGE 1CC/25G) 25G X 5/8" 1 ML MISC Use as directed weekly for allergy injections  . leflunomide (ARAVA) 20 MG tablet   . Melatonin 10 MG TABS Take 10 mg by mouth at bedtime.  . mirtazapine (REMERON) 15 MG tablet Take 45 mg by mouth at bedtime.   . montelukast (SINGULAIR) 10 MG tablet Take 1 tablet (10 mg total) by mouth at bedtime.  . Multiple Vitamin (MULTIVITAMIN) tablet Take 1 tablet by mouth daily.  . NON FORMULARY Chlordiazepoxide-clidinium  . NONFORMULARY OR COMPOUNDED ITEM Allergy Vaccine 1:10 Given at Home  . omeprazole (PRILOSEC) 40 MG capsule Take 40 mg by mouth daily.  . potassium chloride SA (K-DUR,KLOR-CON) 20 MEQ tablet Take 20 mEq by mouth 3 (three) times daily.  . pravastatin (PRAVACHOL) 40 MG tablet Take 40 mg by mouth daily.   . predniSONE (DELTASONE) 10 MG tablet 2 tabs for 1 week , 1 tab for 1 week and  1/2 daily  . traMADol (ULTRAM) 50 MG tablet Take 50 mg by mouth every 8 (eight) hours as needed for pain.  . verapamil (VERELAN PM) 240 MG 24 hr capsule Take 240 mg by mouth daily.  . [DISCONTINUED] amoxicillin-clavulanate (AUGMENTIN) 500-125 MG tablet Take 1 tablet (500 mg total) by mouth 2 (two) times daily. (Patient not taking: Reported on 04/22/2016)  . [DISCONTINUED] Azelastine-Fluticasone (DYMISTA) 137-50 MCG/ACT SUSP Place 1-2 sprays into the nose at bedtime. (Patient not taking: Reported on 04/22/2016)  . [DISCONTINUED] DiphenhydrAMINE HCl, Sleep, (UNISOM SLEEPMELTS PO) Take by mouth at bedtime.  . [DISCONTINUED] ergocalciferol (VITAMIN D2) 50000 UNITS capsule Take 50,000 Units by mouth every 14 (fourteen) days.    . [DISCONTINUED] guaiFENesin (MUCINEX) 600 MG 12 hr tablet Take 1,200 mg by mouth 2 (two) times daily as needed.  . [DISCONTINUED] ipratropium (ATROVENT) 0.03 % nasal spray Place 1-2 sprays into both nostrils 2 (two) times daily. (Patient not taking: Reported on 04/22/2016)  . [DISCONTINUED] Menthol, Topical Analgesic, (BLUE-EMU MAXIMUM STRENGTH EX) Apply 1 application topically daily as needed (for hip, back pain).   No facility-administered encounter medications on file as of 04/22/2016.     Allergies: Erythromycin base; Lactose  intolerance (gi); Lactulose; Mesalamine; Nitrofuran derivatives; Nitrofurantoin; Other; Sulfa antibiotics; Tdap [diphth-acell pertussis-tetanus]; Erythromycin; Tetanus toxoid, adsorbed; and Tetanus toxoids  Social History   Social History  . Marital status: Married    Spouse name: N/A  . Number of children: 2  . Years of education: N/A   Occupational History  . Not on file.   Social History Main Topics  . Smoking status: Never Smoker  . Smokeless tobacco: Never Used     Comment: positive passive tobacco smoke exposure  . Alcohol use Yes     Comment: glass of wine each night   . Drug use: No  . Sexual activity: Yes   Other Topics Concern    . Not on file   Social History Narrative   Exercise - at least 2 times weekly   Caffeine - 2 cups in the morning          Family History  Problem Relation Age of Onset  . Heart disease Mother   . Arthritis Mother   . Heart attack Father   . Diabetes Other     sibling  . Heart attack Other     sibling    Review of Systems: Review of Systems  Constitutional: Negative for weight loss (Gaining weight).  HENT: Negative.   Eyes: Negative.   Respiratory: Positive for cough, shortness of breath and wheezing. Negative for sputum production.   Cardiovascular: Positive for leg swelling. Negative for chest pain, palpitations, orthopnea (Sleeps at 45% at baseline) and PND.  Gastrointestinal: Positive for diarrhea (When nervous; attributes to IBS).  Genitourinary: Negative.   Musculoskeletal: Positive for joint pain and myalgias.  Skin: Negative.   Neurological: Negative.   Endo/Heme/Allergies: Bruises/bleeds easily.  Psychiatric/Behavioral: Negative.    --------------------------------------------------------------------------------------------------  Physical Exam: BP 124/70   Pulse 82   Ht _0  (1.549 m)   Wt 193 lb 3.2 oz (87.6 kg)   BMI 36.50 kg/m   General:  Obese woman, seated comfortably in the exam room. She is accompanied by her husband. HEENT: No conjunctival pallor or scleral icterus.  Moist mucous membranes.  OP clear. Neck: Supple without lymphadenopathy, thyromegaly, JVD, or HJR.  No carotid bruit. Lungs: Normal work of breathing. Mildly diminished breath sounds at the left base. No wheezes or crackles. Fair air movement. CV: Regular rate and rhythm without murmurs, rubs, or gallops.  Non-displaced PMI. Abd: Bowel sounds present.  Soft, NT/ND without hepatosplenomegaly Ext: 2+ left and 1+ right ankle edema.  Radial, PT, and DP pulses are 2+ bilaterally. Chronic deformity of the left ankle joint and hindfoot is noted. Skin: Skin is thinned with scars overlying  the right shin and left knee Neuro: CNIII-XII intact.  Strength and fine-touch sensation intact in upper and lower extremities bilaterally. Psych: Normal mood and affect.  EKG:  Normal sinus rhythm without significant abnormalities.   CTA chest (03/14/16): I personally reviewed the images. Basal predominant lung changes including subpleural reticulation attraction bronchiectasis noted. Scattered subcentimeter pulmonary nodules are also present. There is significant calcification of the coronary arteries.  Lab Results  Component Value Date   WBC 11.5 (H) 01/21/2016   HGB 14.3 01/21/2016   HCT 43.1 01/21/2016   MCV 90.4 01/21/2016   PLT 488.0 (H) 01/21/2016    Lab Results  Component Value Date   NA 137 09/05/2013   K 4.2 09/05/2013   CL 103 09/05/2013   CO2 27 09/05/2013   BUN 16 09/05/2013   CREATININE 0.9 09/05/2013   GLUCOSE 108 (  H) 09/05/2013   ALT 16 08/01/2013    No results found for: CHOL, HDL, LDLCALC, LDLDIRECT, TRIG, CHOLHDL   --------------------------------------------------------------------------------------------------  ASSESSMENT AND PLAN: 1. Shortness of breath This has been a long-standing problem for the patient, but seems to have gotten worse over the last several months. Recent chest CT demonstrated findings consistent with nurse to show lung disease, though these were reportedly stable from prior scan. Exam today is notable for mildly decreased breath sounds at the left base but no wheezes or crackles. The patient has lower extremity edema, that she reports is chronic. She otherwise appears euvolemic without evidence of JVD. I suspect that her dyspnea is likely multifactorial. It is possible that shortness of breath may reflect her anginal equivalent, given his cardiac risk factors and extensive coronary artery calcification noted on recent chest CTA. I encouraged the patient to try using albuterol as needed for her shortness of breath as if this improves her  symptoms. We will obtain a transthoracic echocardiogram to evaluate for any structural heart disease that may be contributing. It should be noted that her LVEF was low normal at 50% 2014. The setting of underlying pulmonary disease, she is also a risk for developing coronary hypertension and right heart dysfunction.  2. Coronary artery disease involving native coronary artery of native heart with angina pectoris [facility]) Though the patient denies chest pain, it is quite possible that exertional dyspnea may reflect her anginal equivalent. She has numerous risk factors for coronary artery disease, including obesity, prior stroke, coronary artery calcification on CT, borderline diabetes, and family history (albeit not premature) CAD. We have discussed further evaluation options and have agreed to proceed with regadenoson myocardial perfusion stress test. In the meantime, we will continue with aspirin and pravastatin pending the results of her stress test. If possible, escalation to a high-intensity statin should be considered, though in light of her myalgias, this may be difficult. The patient was advised to seek immediate medical attention if she develops significant shortness of breath at rest or chest pain.  Follow-up: Return to clinic in approximately 6 weeks to reassess symptoms and review test results.  Nelva Bush, MD 04/22/2016 12:38 PM

## 2016-04-22 NOTE — Patient Instructions (Signed)
Medication Instructions:  The current medical regimen is effective;  continue present plan and medications.  Testing/Procedures: Your physician has requested that you have a lexiscan myoview. For further information please visit [email or web address]. Please follow instruction sheet, as given.  Your physician has requested that you have an echocardiogram. Echocardiography is a painless test that uses sound waves to create images of your heart. It provides your doctor with information about the size and shape of your heart and how well your heart's chambers and valves are working. This procedure takes approximately one hour. There are no restrictions for this procedure.  Follow-Up: Follow up in 6 weeks for with Dr End.  Thank you for choosing [facility]!!

## 2016-04-26 NOTE — Addendum Note (Signed)
Addended by: Patterson Hammersmith A on: 04/26/2016 03:43 PM   Modules accepted: Orders

## 2016-04-28 ENCOUNTER — Other Ambulatory Visit: Payer: Self-pay | Admitting: Internal Medicine

## 2016-05-06 DIAGNOSIS — H26492 Other secondary cataract, left eye: Secondary | ICD-10-CM | POA: Diagnosis not present

## 2016-05-06 DIAGNOSIS — M069 Rheumatoid arthritis, unspecified: Secondary | ICD-10-CM | POA: Diagnosis not present

## 2016-05-09 ENCOUNTER — Telehealth (HOSPITAL_COMMUNITY): Payer: Self-pay | Admitting: *Deleted

## 2016-05-09 NOTE — Telephone Encounter (Signed)
Patient given detailed instructions per Myocardial Perfusion Study Information Sheet for the test on 05/11/16 at 0945. Patient notified to arrive 15 minutes early and that it is imperative to arrive on time for appointment to keep from having the test rescheduled.  If you need to cancel or reschedule your appointment, please call the office within 24 hours of your appointment. Failure to do so may result in a cancellation of your appointment, and a $50 no show fee. Patient verbalized understanding.Fitzhugh Vizcarrondo, [place]anae [place]

## 2016-05-11 ENCOUNTER — Other Ambulatory Visit: Payer: Self-pay

## 2016-05-11 ENCOUNTER — Ambulatory Visit (HOSPITAL_BASED_OUTPATIENT_CLINIC_OR_DEPARTMENT_OTHER): Payer: Medicare Other

## 2016-05-11 ENCOUNTER — Ambulatory Visit (HOSPITAL_COMMUNITY): Payer: Medicare Other | Attending: Cardiovascular Disease

## 2016-05-11 DIAGNOSIS — E669 Obesity, unspecified: Secondary | ICD-10-CM | POA: Diagnosis not present

## 2016-05-11 DIAGNOSIS — R0602 Shortness of breath: Secondary | ICD-10-CM

## 2016-05-11 DIAGNOSIS — I071 Rheumatic tricuspid insufficiency: Secondary | ICD-10-CM | POA: Insufficient documentation

## 2016-05-11 DIAGNOSIS — Z8673 Personal history of transient ischemic attack (TIA), and cerebral infarction without residual deficits: Secondary | ICD-10-CM | POA: Insufficient documentation

## 2016-05-11 DIAGNOSIS — Z6836 Body mass index (BMI) 36.0-36.9, adult: Secondary | ICD-10-CM | POA: Insufficient documentation

## 2016-05-11 DIAGNOSIS — Z8249 Family history of ischemic heart disease and other diseases of the circulatory system: Secondary | ICD-10-CM | POA: Diagnosis not present

## 2016-05-11 DIAGNOSIS — R5383 Other fatigue: Secondary | ICD-10-CM | POA: Insufficient documentation

## 2016-05-11 DIAGNOSIS — J45909 Unspecified asthma, uncomplicated: Secondary | ICD-10-CM | POA: Insufficient documentation

## 2016-05-11 LAB — ECHOCARDIOGRAM COMPLETE
Height: 61 in
Weight: 3088 oz

## 2016-05-11 MED ORDER — REGADENOSON 0.4 MG/5ML IV SOLN
0.4000 mg | Freq: Once | INTRAVENOUS | Status: AC
  Administered 2016-05-11: 0.4 mg via INTRAVENOUS

## 2016-05-11 MED ORDER — TECHNETIUM TC 99M TETROFOSMIN IV KIT
32.9000 | PACK | Freq: Once | INTRAVENOUS | Status: AC | PRN
  Administered 2016-05-11: 32.9 via INTRAVENOUS
  Filled 2016-05-11: qty 33

## 2016-05-16 ENCOUNTER — Telehealth: Payer: Self-pay | Admitting: Internal Medicine

## 2016-05-16 NOTE — Telephone Encounter (Signed)
Notes Recorded by Nelva Bush, MD on 05/12/2016 at 10:50 AM EDT Please let Ms. Helmes know that her echocardiogram did not show any significant abnormalities. We will follow-up with again after reviewing the results of her stress test. Thanks.   Notified the pt of echo results per Dr End, as mentioned above.  Pt verbalized understanding.

## 2016-05-16 NOTE — Telephone Encounter (Signed)
New message      Returning a call to the nurse to get echo results

## 2016-05-18 DIAGNOSIS — R600 Localized edema: Secondary | ICD-10-CM | POA: Diagnosis not present

## 2016-05-18 DIAGNOSIS — M25441 Effusion, right hand: Secondary | ICD-10-CM | POA: Diagnosis not present

## 2016-05-18 DIAGNOSIS — M25442 Effusion, left hand: Secondary | ICD-10-CM | POA: Diagnosis not present

## 2016-05-18 DIAGNOSIS — R0602 Shortness of breath: Secondary | ICD-10-CM | POA: Diagnosis not present

## 2016-05-18 DIAGNOSIS — M0609 Rheumatoid arthritis without rheumatoid factor, multiple sites: Secondary | ICD-10-CM | POA: Diagnosis not present

## 2016-05-19 ENCOUNTER — Ambulatory Visit (HOSPITAL_COMMUNITY): Payer: Medicare Other

## 2016-05-20 ENCOUNTER — Ambulatory Visit (HOSPITAL_COMMUNITY): Payer: Medicare Other | Attending: Cardiovascular Disease

## 2016-05-20 LAB — MYOCARDIAL PERFUSION IMAGING
LV dias vol: 59 mL (ref 46–106)
LV sys vol: 23 mL
Peak HR: 100 {beats}/min
RATE: 0.35
Rest HR: 86 {beats}/min
SDS: 7
SRS: 6
SSS: 12
TID: 0.8

## 2016-05-20 MED ORDER — TECHNETIUM TC 99M TETROFOSMIN IV KIT
32.5000 | PACK | Freq: Once | INTRAVENOUS | Status: AC | PRN
  Administered 2016-05-20: 32.5 via INTRAVENOUS
  Filled 2016-05-20: qty 33

## 2016-05-25 ENCOUNTER — Other Ambulatory Visit: Payer: Self-pay | Admitting: Internal Medicine

## 2016-05-30 ENCOUNTER — Ambulatory Visit (INDEPENDENT_AMBULATORY_CARE_PROVIDER_SITE_OTHER): Payer: Medicare Other | Admitting: Internal Medicine

## 2016-05-30 ENCOUNTER — Encounter: Payer: Self-pay | Admitting: Internal Medicine

## 2016-05-30 ENCOUNTER — Telehealth: Payer: Self-pay | Admitting: Internal Medicine

## 2016-05-30 VITALS — BP 134/76 | HR 80 | Ht 61.0 in | Wt 191.0 lb

## 2016-05-30 DIAGNOSIS — R0602 Shortness of breath: Secondary | ICD-10-CM

## 2016-05-30 DIAGNOSIS — I2510 Atherosclerotic heart disease of native coronary artery without angina pectoris: Secondary | ICD-10-CM | POA: Diagnosis not present

## 2016-05-30 DIAGNOSIS — I25119 Atherosclerotic heart disease of native coronary artery with unspecified angina pectoris: Secondary | ICD-10-CM

## 2016-05-30 DIAGNOSIS — R600 Localized edema: Secondary | ICD-10-CM

## 2016-05-30 DIAGNOSIS — J309 Allergic rhinitis, unspecified: Secondary | ICD-10-CM | POA: Diagnosis not present

## 2016-05-30 LAB — BASIC METABOLIC PANEL
BUN: 19 mg/dL (ref 7–25)
CO2: 29 mmol/L (ref 20–31)
Calcium: 9.9 mg/dL (ref 8.6–10.4)
Chloride: 106 mmol/L (ref 98–110)
Creat: 0.91 mg/dL (ref 0.60–0.93)
Glucose, Bld: 72 mg/dL (ref 65–99)
Potassium: 4 mmol/L (ref 3.5–5.3)
Sodium: 143 mmol/L (ref 135–146)

## 2016-05-30 MED ORDER — FUROSEMIDE 40 MG PO TABS: 40.0000 mg | ORAL_TABLET | Freq: Two times a day (BID) | ORAL | 1 refills | Status: DC

## 2016-05-30 NOTE — Progress Notes (Signed)
Follow-up Outpatient Visit Date: 05/30/2016  Chief Complaint: Shortness of breath  HPI:  [name] is a 70 y.o. year-old female with history of asthmatic bronchitis, GERD, rheumatoid arthritis, CVA (small bilateral basal ganglia lacunar infarcts noted by brain MRI in 11/2012), prediabetes with hemoglobin A1c of 6.0 and 2014, irritable bowel syndrome, and allergic rhinitis, who presents for follow-up of shortness of breath. I first met on 04/22/16, which time she complained of progressive shortness of breath over the last 2 years following an episode of "double pneumonia and flu." Her breathing had improved at times, never returned to baseline. She experienced significant dyspnea while on a cruise this summer, prompting referral to cardiology. Due to her history of underlying lung disease and coronary artery calcification noted on chest CT, would proceed with transthoracic echocardiogram and myocardial perfusion stress test which were largely unrevealing with the exception of grade 1 diastolic dysfunction and mild tricuspid regurgitation. Since our last visit, [name] has continued to have significant shortness of breath with exertion, such as walking from the parking lot into our office today.  She does not have dyspnea at rest but sleeps at a 45 degree incline.  Her husband also notes that the patient's breathing is irregular at night.  She has not undergone sleep study in the past.  The patient also notes significant leg swelling, left greater than right.  Her furosemide was increased to 40 mg BID by Dr. Charlestine Night, which resulted in significant improvement in her edema.  She also loast about 10 pounds in 5 days (her weight is down ~2 pounds since our last visit on 04/22/16).  For the last few weeks, she has been alternating 40 mg daily and BID, with some return of the swelling.  She has not tried the albuterol inhaler, as we discussed at our last visit, to see if this would improve some of her dyspnea.  The  patient denies chest pain, palpitations, and lightheadedness.  [name] and her husband eat out regularly and do not limit their salt intake.  She also drinks at least 2 liters of diet soda per day.  --------------------------------------------------------------------------------------------------  Cardiovascular History & Procedures: Cardiovascular Problems:  Shortness of breath  Coronary artery calcification  Risk Factors:  History of stroke, borderline diabetes, hyperlipidemia, obesity, and chronic steroid use  Cath/PCI:  None  CV Surgery:  None  EP Procedures and Devices:  None  Non-Invasive Evaluation(s):  Transthoracic echocardiogram (05/11/16): Normal LV size and in traction (EF 55-60%). Normal LV wall motion. Grade 1 diastolic. Normal RV size and function. Mild mitral valve thickening. Mild TR.  Pharmacologic myocardial perfusion stress test (05/11/16): Low risk study without ischemia or scar. LVEF 62%.  Bilateral lower extremity venous duplex (04/07/16): No evidence of DVT though left peroneal vein was suboptimally imaged.  Transthoracic echocardiogram (12/17/12): Normal LV size with abnormal septal motion and EF of 50%. Grade 1 diastolic dysfunction noted. Aortic sclerosis without stenosis or regurgitation evident. Trivial MR and TR without other significant valvular disease. Normal PA systolic and central venous pressures.  Bilateral lower extremity arterial duplex (12/05/12): Bilateral ABIs 1.2. No significant stenosis identified.  Recent CV Pertinent Labs: Lab Results  Component Value Date   INR 0.90 09/03/2010   K 4.2 09/05/2013   BUN 16 09/05/2013   BUN 12 11/15/2012   CREATININE 0.9 09/05/2013    Past medical and surgical history were reviewed and updated in EPIC.   Outpatient Encounter Prescriptions as of 05/30/2016  Medication Sig  . albuterol (PROAIR  HFA) 108 (90 Base) MCG/ACT inhaler Inhale 2 puffs into the lungs every 4 (four) hours as needed for  wheezing or shortness of breath.  Marland Kitchen albuterol (PROVENTIL) (2.5 MG/3ML) 0.083% nebulizer solution UE 1 VIAL IN NEBULIZER EVERY 6 HOURS AS NEEDED FOR WHEEZING OR SHORTNESS OF BREATH  . aspirin EC 81 MG tablet Take 81 mg by mouth daily.  . benzonatate (TESSALON) 100 MG capsule TAKE 1 CAPSULE (100 MG TOTAL) BY MOUTH 4 (FOUR) TIMES DAILY AS NEEDED FOR COUGH.  . cetirizine (ZYRTEC) 10 MG tablet Take 10 mg by mouth daily.  . colestipol (COLESTID) 1 G tablet Take 1 tablet in am and 1 tablet in pm.  . FLUoxetine (PROZAC) 20 MG tablet Take 20 mg by mouth daily.    . fluticasone (FLONASE) 50 MCG/ACT nasal spray Place 2 sprays into the nose daily.   . furosemide (LASIX) 20 MG tablet Take 40 mg by mouth daily.   Marland Kitchen glycopyrrolate (ROBINUL) 1 MG tablet Take 1 mg by mouth 3 (three) times daily as needed (for IBS).   . hydroxychloroquine (PLAQUENIL) 200 MG tablet Take 200 mg by mouth 3 (three) times daily.  . Insulin Syringe-Needle U-100 (B-D INSULIN SYRINGE 1CC/25G) 25G X 5/8" 1 ML MISC Use as directed weekly for allergy injections  . ipratropium (ATROVENT) 0.03 % nasal spray USE 1 TO 2 SPRAYS INTO BOTH NOSTRILS 2 (TWO) TIMES  DAILY.  Marland Kitchen leflunomide (ARAVA) 20 MG tablet   . Melatonin 10 MG TABS Take 10 mg by mouth at bedtime.  . mirtazapine (REMERON) 15 MG tablet Take 45 mg by mouth at bedtime.   . montelukast (SINGULAIR) 10 MG tablet TAKE 1 TABLET BY MOUTH AT  BEDTIME  . Multiple Vitamin (MULTIVITAMIN) tablet Take 1 tablet by mouth daily.  . NON FORMULARY Chlordiazepoxide-clidinium  . NONFORMULARY OR COMPOUNDED ITEM Allergy Vaccine 1:10 Given at Home  . omeprazole (PRILOSEC) 40 MG capsule Take 40 mg by mouth daily.  . potassium chloride SA (K-DUR,KLOR-CON) 20 MEQ tablet Take 20 mEq by mouth 3 (three) times daily.  . pravastatin (PRAVACHOL) 40 MG tablet Take 40 mg by mouth daily.   . predniSONE (DELTASONE) 10 MG tablet 2 tabs for 1 week , 1 tab for 1 week and 1/2 daily  . traMADol (ULTRAM) 50 MG tablet Take  50 mg by mouth every 8 (eight) hours as needed for pain.  . verapamil (VERELAN PM) 240 MG 24 hr capsule Take 240 mg by mouth daily.   No facility-administered encounter medications on file as of 05/30/2016.     Allergies: Erythromycin base; Lactose intolerance (gi); Lactulose; Mesalamine; Nitrofuran derivatives; Nitrofurantoin; Other; Sulfa antibiotics; Tdap [diphth-acell pertussis-tetanus]; Erythromycin; Tetanus toxoid, adsorbed; and Tetanus toxoids  Social History   Social History  . Marital status: Married    Spouse name: N/A  . Number of children: 2  . Years of education: N/A   Occupational History  . Not on file.   Social History Main Topics  . Smoking status: Never Smoker  . Smokeless tobacco: Never Used     Comment: positive passive tobacco smoke exposure  . Alcohol use Yes     Comment: glass of wine each night   . Drug use: No  . Sexual activity: Yes   Other Topics Concern  . Not on file   Social History Narrative   Exercise - at least 2 times weekly   Caffeine - 2 cups in the morning          Family History  Problem Relation Age of Onset  . Heart disease Mother   . Arthritis Mother   . Heart attack Father   . Diabetes Other     sibling  . Heart attack Other     sibling    Review of Systems: A 12-system review of systems was performed and was negative except as noted in the HPI.  --------------------------------------------------------------------------------------------------  Physical Exam: BP 134/76   Pulse 80   Ht _0  (1.549 m)   Wt 191 lb (86.6 kg)   BMI 36.09 kg/m   General:  Obese woman seated on the exam table.  She is accompanied by her husband. HEENT: No conjunctival pallor or scleral icterus.  Moist mucous membranes.  OP clear. Neck: Supple without lymphadenopathy, thyromegaly, JVD, or HJR. Lungs: Normal work of breathing.  Clear to auscultation bilaterally without wheezes or crackles. Heart: Regular rate and rhythm without  murmurs, rubs, or gallops.  Non-displaced PMI. Abd: Bowel sounds present.  Soft, NT/ND without hepatosplenomegaly Ext: 1+ left and trace right pretibial edema up to the midcalf.  Radial, PT, and DP pulses are 2+ bilaterally. Skin: warm and dry without rash  Lab Results  Component Value Date   WBC 11.5 (H) 01/21/2016   HGB 14.3 01/21/2016   HCT 43.1 01/21/2016   MCV 90.4 01/21/2016   PLT 488.0 (H) 01/21/2016    Lab Results  Component Value Date   NA 137 09/05/2013   K 4.2 09/05/2013   CL 103 09/05/2013   CO2 27 09/05/2013   BUN 16 09/05/2013   CREATININE 0.9 09/05/2013   GLUCOSE 108 (H) 09/05/2013   ALT 16 08/01/2013   --------------------------------------------------------------------------------------------------  ASSESSMENT AND PLAN: Shortness of breath This is likely multifactorial, including underlying lung disease, obesity with deconditioning, and diastolic dysfunction.  The patient is at risk for pulmonary hypertension as well, given her rheumatologic history.  Her echo did not demonstrate evidence of pulmonary hypertension, though an adequate TR jet was not obtained, which I confirmed on review of the images today.  I have encouraged the patient to take furosemide 40 mg BID and to limit her salt intake.  I also advised her to speak with Dr. Annamaria Boots, her pulmonologist, about further pulmonary evaluation (including sleep study).  If pulmonary evaluation and improved fluid management do not improve her dyspnea, we will proceed with right heart catheterization (+/- left heart catheterization).  We discussed the risks and benefits of this procedure and have agreed to optimize medical therapy first.  Her myocardial perfusion stress test did not show evidence of ischemia, though prior chest CT did show significant coronary artery calcification consistent with CAD.  The patient should continue with aggressive risk factor modification, as directed by Dr. Arelia Sneddon.  Leg edema I suspect this  is primarily due to venous insufficiency, though element of right heart and/or diastolic LV dysfunction may be contributing.  As above, we will increase furosemide to 40 mg BID.  We will check a BMP today to evaluate her renal function and potassium, given recent changes in diuretic dosing.  I have also encouraged the patient to wear compression stockings, to see if this helps improve her edema.  Follow-up: Return to clinic in 2 months.  Nelva Bush, MD 05/30/2016 7:54 AM

## 2016-05-30 NOTE — Patient Instructions (Signed)
Medication Instructions:  Take lasix (furosemide) 75m two times a day  Labwork: BMET today  Testing/Procedures: None   Follow-Up: Your physician recommends that you schedule a follow-up appointment in: 2 months with [facility].   Any Other Special Instructions Will Be Listed Below (If Applicable).  Use knee high compression stockings to help the swelling in your feet and legs. Put them on in the morning and take them off at night.  Keep your feet and legs elevated as much as you can during the day. Limit the salt (sodium) in your diet.   If you need a refill on your cardiac medications before your next appointment, please call your pharmacy.

## 2016-05-30 NOTE — Telephone Encounter (Signed)
Allergy Serum Extract Date Mixed: 05/30/16 Vial: 1 Strength: 1:10 Here/Mail/Pick Up: mail Mixed By: tbs Last OV: 03/24/16 Pending OV: N/A

## 2016-06-02 DIAGNOSIS — M0609 Rheumatoid arthritis without rheumatoid factor, multiple sites: Secondary | ICD-10-CM | POA: Diagnosis not present

## 2016-06-02 DIAGNOSIS — R600 Localized edema: Secondary | ICD-10-CM | POA: Diagnosis not present

## 2016-06-02 DIAGNOSIS — R0602 Shortness of breath: Secondary | ICD-10-CM | POA: Diagnosis not present

## 2016-06-02 DIAGNOSIS — M25572 Pain in left ankle and joints of left foot: Secondary | ICD-10-CM | POA: Diagnosis not present

## 2016-06-02 DIAGNOSIS — Z79899 Other long term (current) drug therapy: Secondary | ICD-10-CM | POA: Diagnosis not present

## 2016-06-23 DIAGNOSIS — R600 Localized edema: Secondary | ICD-10-CM | POA: Diagnosis not present

## 2016-06-23 DIAGNOSIS — M0609 Rheumatoid arthritis without rheumatoid factor, multiple sites: Secondary | ICD-10-CM | POA: Diagnosis not present

## 2016-06-23 DIAGNOSIS — R0602 Shortness of breath: Secondary | ICD-10-CM | POA: Diagnosis not present

## 2016-06-23 DIAGNOSIS — Z79899 Other long term (current) drug therapy: Secondary | ICD-10-CM | POA: Diagnosis not present

## 2016-07-12 ENCOUNTER — Telehealth: Payer: Self-pay | Admitting: Internal Medicine

## 2016-07-12 NOTE — Telephone Encounter (Signed)
Spoke with pt. States that she is not feeling well. Pt requests appointment, advised her that we do not have any available appointments today. Advised pt that she go to her nearest urgent care. She verbalized understanding. Nothing further was needed.

## 2016-07-14 DIAGNOSIS — J019 Acute sinusitis, unspecified: Secondary | ICD-10-CM | POA: Diagnosis not present

## 2016-07-14 DIAGNOSIS — J018 Other acute sinusitis: Secondary | ICD-10-CM | POA: Diagnosis not present

## 2016-07-14 DIAGNOSIS — R0600 Dyspnea, unspecified: Secondary | ICD-10-CM | POA: Diagnosis not present

## 2016-07-19 ENCOUNTER — Other Ambulatory Visit: Payer: Self-pay | Admitting: Internal Medicine

## 2016-07-19 ENCOUNTER — Other Ambulatory Visit: Payer: Self-pay | Admitting: Acute Care

## 2016-07-25 DIAGNOSIS — M19071 Primary osteoarthritis, right ankle and foot: Secondary | ICD-10-CM | POA: Diagnosis not present

## 2016-07-28 ENCOUNTER — Other Ambulatory Visit (INDEPENDENT_AMBULATORY_CARE_PROVIDER_SITE_OTHER): Payer: Medicare Other

## 2016-07-28 ENCOUNTER — Ambulatory Visit (INDEPENDENT_AMBULATORY_CARE_PROVIDER_SITE_OTHER): Payer: Medicare Other | Admitting: Internal Medicine

## 2016-07-28 ENCOUNTER — Ambulatory Visit (INDEPENDENT_AMBULATORY_CARE_PROVIDER_SITE_OTHER)
Admission: RE | Admit: 2016-07-28 | Discharge: 2016-07-28 | Disposition: A | Discharge status: Home / Self Care | Payer: Medicare Other | Source: Ambulatory Visit | Attending: Internal Medicine | Admitting: Internal Medicine

## 2016-07-28 ENCOUNTER — Encounter: Payer: Self-pay | Admitting: Internal Medicine

## 2016-07-28 VITALS — BP 124/70 | HR 90 | Ht 61.0 in | Wt 180.8 lb

## 2016-07-28 DIAGNOSIS — E2740 Unspecified adrenocortical insufficiency: Secondary | ICD-10-CM

## 2016-07-28 DIAGNOSIS — J441 Chronic obstructive pulmonary disease with (acute) exacerbation: Secondary | ICD-10-CM

## 2016-07-28 DIAGNOSIS — J45901 Unspecified asthma with (acute) exacerbation: Secondary | ICD-10-CM | POA: Diagnosis not present

## 2016-07-28 DIAGNOSIS — R0602 Shortness of breath: Secondary | ICD-10-CM | POA: Diagnosis not present

## 2016-07-28 DIAGNOSIS — J849 Interstitial pulmonary disease, unspecified: Secondary | ICD-10-CM

## 2016-07-28 DIAGNOSIS — R05 Cough: Secondary | ICD-10-CM | POA: Diagnosis not present

## 2016-07-28 DIAGNOSIS — G4733 Obstructive sleep apnea (adult) (pediatric): Secondary | ICD-10-CM | POA: Diagnosis not present

## 2016-07-28 LAB — CORTISOL: Cortisol, Plasma: 9.7 ug/dL

## 2016-07-28 MED ORDER — FLUCONAZOLE 150 MG PO TABS: 150.0000 mg | ORAL_TABLET | Freq: Every day | ORAL | 1 refills | Status: DC

## 2016-07-28 MED ORDER — PROMETHAZINE-CODEINE 6.25-10 MG/5ML PO SYRP: 5.0000 mL | ORAL_SOLUTION | Freq: Four times a day (QID) | ORAL | 0 refills | Status: DC | PRN

## 2016-07-28 MED ORDER — AMOXICILLIN 500 MG PO TABS: 500.0000 mg | ORAL_TABLET | Freq: Two times a day (BID) | ORAL | 0 refills | Status: DC

## 2016-07-28 NOTE — Patient Instructions (Signed)
Sample Bevespi inhaler      Inhale 2 puffs twice daily     See if this helps your breathing  Script printed prometh codeine      You can use your nebulizer treatments up to every 6 hours if needed  Order- schedule unattended Home Sleep Test       Dx OSA  Order- lab- random cortisol level     Dx adrenal insufficiency  Order- CXR  Dx exacerbation asthmatic bronchitis

## 2016-07-28 NOTE — Assessment & Plan Note (Signed)
Monitor for progressive interstitial lung disease. Hopefully this pattern is stable.

## 2016-07-28 NOTE — Assessment & Plan Note (Signed)
Exacerbation follows what may have been originally a viral URI. We can extend antibiotic coverage by changing to Augmentin which would help with bacterial bronchitis and any residual bacterial sinusitis. Also try sample both S/P inhaler with discussion Because of chronic steroid therapy, consider if some of her exertional dyspnea complaint could be from adrenal insufficiency so we will check a random serum cortisol. CXR.

## 2016-07-28 NOTE — Progress Notes (Signed)
Subjective:    [name], female    DOB: 1946-01-28, 71 y.o.   MRN: [id number]  HPI F never smoker followed for allergic rhinitis, rhinosinusitis, asthmatic bronchitis, ILD, complicated by , GERD, rheumatoid arthritis, ASCVD PFT 09/03/2015-mild obstructive airways disease, minimal restriction, mild diffusion defect. FVC 2.17/83%, FEV1 1.80/92%, ratio 0.83. No response to bronchodilator. TLC 78%, DLCO 73%.  CT chest High Resolution 03/14/2016 1. Mild basilar predominant subpleural reticulation and traction bronchiolectasis, grossly stable, possibly due to nonspecific interstitial pneumonitis. Usual interstitial pneumonitis is not Excluded. Nuclear stress test 05/20/2016-no evidence of significant blockage Echocardiogram 05/11/2016- WNL EF 55-60%  --------------------------------------------------------  07/28/2016-71 year old female never smoker followed for allergic rhinitis, rhinosinusitis, asthmatic bronchitis, complicated by GERD/Crohn's, Rheumatoid arthritis/prednisone FOLLOWS FOR: Pt has been having increased SOB more than usual; recently been treated by PCP with Levaquin and then Cipro for infection. Cough, chest congestion; had fevers.  Pt states her Cardiologist states she need to see about a sleep study to be done and also if her lungs or heart is causing her SOB. Nuclear stress test 05/20/2016-no evidence of significant blockage Echocardiogram 05/11/2016- WNL EF 55-60% Acute sinusitis then bronchitis with fever and sweats. Still cough productive green after Z-Pak then Levaquin then Cipro. Rheumatologist stopped prednisone 6 weeks ago. Gives on allergy vaccine and wants to continue. We discussed low sugar of our program and option to go to a different office. Doesn't think Memory Dance helps much. Complains more of shortness of breath with exertion, minor wheeze and cough. Activity limited by left ankle pain. Surgeon told her she might lose her foot if she had  surgery. Husband brings recording of her breathing while asleep which he describes more as "shallow" than as noisy snoring.  ROS-see HPI Constitutional:   +  weight loss, night sweats, fevers, chills, fatigue, lassitude. HEENT:   No-  headaches, difficulty swallowing, tooth/dental problems, sore throat,       No-  sneezing, itching, ear ache, + nasal congestion, +post nasal drip,  CV:  No-   chest pain, orthopnea, PND, swelling in lower extremities, anasarca,                                                      dizziness, palpitations Resp: +  shortness of breath with exertion or at rest.               + productive cough, non-productive cough,  No- coughing up of blood.             + change in color of mucus.  No- wheezing.   Skin: No-   rash or lesions. GI:  No-   heartburn, indigestion, abdominal pain, nausea, vomiting,  GU:  MS: +  joint pain or swelling.   Neuro-     nothing unusual Psych:  No- change in mood or affect. No depression or anxiety.  No memory loss.  Objective:  OBJ- Physical Exam  General- Alert, Oriented, Affect-appropriate, Distress- none acute, + Obese Skin- + stasis changes right lower leg Lymphadenopathy- none Head- atraumatic            Eyes- Gross vision intact, PERRLA, conjunctivae and secretions clear            Ears- Hearing, canals-normal            Nose- Clear, no-Septal dev,  mucus, polyps, erosion, perforation             Throat- Mallampati II , mucosa-clear, drainage- none, tonsils-                                               atrophic, voice normal, +question light thrush Neck- flexible , trachea midline, no stridor , thyroid nl, carotid no bruit Chest - symmetrical excursion , unlabored           Heart/CV- RRR , no murmur , no gallop  , no rub, nl s1 s2                           - JVD- none , edema- none, stasis changes- none, varices- none           Lung-   + dry, wheezing -none, + coarse rhonchi unlabored           Chest wall-  Abd-  Br/ Gen/  Rectal- Not done, not indicated Extrem- +scar from treated wound,  Neuro- grossly intact to observation

## 2016-07-28 NOTE — Assessment & Plan Note (Signed)
Managed by Dr. Charlestine Night. We discussed immunosuppressive effect of medications potentially delaying recovery from acute infections such as her bronchitis.

## 2016-07-29 ENCOUNTER — Ambulatory Visit (INDEPENDENT_AMBULATORY_CARE_PROVIDER_SITE_OTHER): Payer: Medicare Other | Admitting: Internal Medicine

## 2016-07-29 VITALS — BP 110/60 | HR 94 | Ht 61.0 in | Wt 179.4 lb

## 2016-07-29 DIAGNOSIS — R0602 Shortness of breath: Secondary | ICD-10-CM | POA: Diagnosis not present

## 2016-07-29 DIAGNOSIS — R600 Localized edema: Secondary | ICD-10-CM

## 2016-07-29 MED ORDER — GLYCOPYRROLATE-FORMOTEROL 9-4.8 MCG/ACT IN AERO: 2.0000 | INHALATION_SPRAY | Freq: Two times a day (BID) | RESPIRATORY_TRACT | 0 refills | Status: DC

## 2016-07-29 NOTE — Addendum Note (Signed)
Addended by: Parke Poisson E on: 07/29/2016 12:42 PM   Modules accepted: Orders

## 2016-07-29 NOTE — Progress Notes (Signed)
Patient seen in the office today and instructed on use of Bevespi.  Patient expressed understanding and demonstrated technique.  Parke Poisson, [facility] 07/28/2016

## 2016-07-29 NOTE — Progress Notes (Signed)
Follow-up Outpatient Visit Date: 07/29/2016  Chief Complaint: Shortness of breath  HPI:  [name] is a 71 y.o. year-old female with history of asthmatic bronchitis, GERD, rheumatoid arthritis, CVA (small bilateral basal ganglia lacunar infarcts noted by brain MRI in 11/2012), prediabetes with hemoglobin A1c of 6.0 and 2014, irritable bowel syndrome, and allergic rhinitis, who presents for follow-up of shortness of breath. I last saw her on 05/30/16, at which time she continued to have significant exertional dyspnea. She also had significant leg edema, prompting [place] to increase furosemide to 40 mg BID. She noted significant reduction in swelling, and decreased furosemide to 40 mg daily about a week ago.  Unfortunately, the patient contracted a respiratory illness just before Christmas and spent much of the holidays in bed. Her dyspnea was significantly worse, though it has started to gradually improve. [name] was seen by her pulmonologist, Dr. Annamaria Boots, yesterday, who began a course of amoxicillin. She continues to have cough and wheezing as well. The patient also reports considerable fatigue. Random cortisol was checked yesterday, given the patient's history of long-term steroid use (prednisone was stopped this fall by her rheumatologist).  [name] denies chest pain, palpitations, and lightheadedness. She has been trying to limit her salt intake but continues to eat out on occasion and also drinks quite a bit of diet coke. She notes that her appetite has been poor ever since her respiratory illness began last month. She estimates having lost ~15 pounds within the last month. Her appetite is slowly starting to return.  --------------------------------------------------------------------------------------------------  Cardiovascular History & Procedures: Cardiovascular Problems:  Shortness of breath  Coronary artery calcification  Stroke  Risk Factors:  History of stroke, borderline diabetes,  hyperlipidemia, obesity, and chronic steroid use  Cath/PCI:  None  CV Surgery:  None  EP Procedures and Devices:  None  Non-Invasive Evaluation(s):  Transthoracic echocardiogram (05/11/16): Normal LV size and in traction (EF 55-60%). Normal LV wall motion. Grade 1 diastolic. Normal RV size and function. Mild mitral valve thickening. Mild TR.  Pharmacologic myocardial perfusion stress test (05/11/16): Low risk study without ischemia or scar. LVEF 62%.  Bilateral lower extremity venous duplex (04/07/16): No evidence of DVT though left peroneal vein was suboptimally imaged.  Transthoracic echocardiogram (12/17/12): Normal LV size with abnormal septal motion and EF of 50%. Grade 1 diastolic dysfunction noted. Aortic sclerosis without stenosis or regurgitation evident. Trivial MR and TR without other significant valvular disease. Normal PA systolic and central venous pressures.  Bilateral lower extremity arterial duplex (12/05/12): Bilateral ABIs 1.2. No significant stenosis identified.  Recent CV Pertinent Labs: Lab Results  Component Value Date   INR 0.90 09/03/2010   K 4.0 05/30/2016   BUN 19 05/30/2016   BUN 12 11/15/2012   CREATININE 0.91 05/30/2016    Past medical and surgical history were reviewed and updated in EPIC.   Outpatient Encounter Prescriptions as of 07/29/2016  Medication Sig  . albuterol (PROAIR HFA) 108 (90 Base) MCG/ACT inhaler Inhale 2 puffs into the lungs every 4 (four) hours as needed for wheezing or shortness of breath.  Marland Kitchen albuterol (PROVENTIL) (2.5 MG/3ML) 0.083% nebulizer solution UE 1 VIAL IN NEBULIZER EVERY 6 HOURS AS NEEDED FOR WHEEZING OR SHORTNESS OF BREATH  . amoxicillin (AMOXIL) 500 MG tablet Take 1 tablet (500 mg total) by mouth 2 (two) times daily.  Marland Kitchen aspirin EC 81 MG tablet Take 81 mg by mouth daily.  . benzonatate (TESSALON) 100 MG capsule TAKE 1 CAPSULE (100 MG TOTAL) BY MOUTH  4 (FOUR) TIMES DAILY AS NEEDED FOR COUGH.  . cetirizine (ZYRTEC)  10 MG tablet Take 10 mg by mouth daily.  . colestipol (COLESTID) 1 G tablet Take 1 g by mouth 2 (two) times daily. Take 1 tablet in am and 1 tablet in pm.  . fluconazole (DIFLUCAN) 150 MG tablet Take 1 tablet (150 mg total) by mouth daily.  Marland Kitchen FLUoxetine (PROZAC) 20 MG tablet Take 20 mg by mouth daily.    . fluticasone (FLONASE) 50 MCG/ACT nasal spray Place 2 sprays into the nose daily.   Marland Kitchen glycopyrrolate (ROBINUL) 1 MG tablet Take 1 mg by mouth 3 (three) times daily as needed (for IBS).   . hydroxychloroquine (PLAQUENIL) 200 MG tablet Take 200 mg by mouth 3 (three) times daily.  . Insulin Syringe-Needle U-100 (B-D INSULIN SYRINGE 1CC/25G) 25G X 5/8" 1 ML MISC Use as directed weekly for allergy injections  . ipratropium (ATROVENT) 0.03 % nasal spray USE 1 TO 2 SPRAYS INTO BOTH NOSTRILS 2 (TWO) TIMES  DAILY.  Marland Kitchen leflunomide (ARAVA) 20 MG tablet Take 20 mg by mouth daily.   . Melatonin 10 MG TABS Take 10 mg by mouth at bedtime.  . mirtazapine (REMERON) 15 MG tablet Take 45 mg by mouth at bedtime.   . montelukast (SINGULAIR) 10 MG tablet TAKE 1 TABLET BY MOUTH AT  BEDTIME  . Multiple Vitamin (MULTIVITAMIN) tablet Take 1 tablet by mouth daily.  . NON FORMULARY Take 1 Dose by mouth daily. Chlordiazepoxide-clidinium   . NONFORMULARY OR COMPOUNDED ITEM Allergy Vaccine 1:10 Given at Home  . omeprazole (PRILOSEC) 40 MG capsule Take 40 mg by mouth daily.  . potassium chloride SA (K-DUR,KLOR-CON) 20 MEQ tablet Take 20 mEq by mouth 3 (three) times daily.  . pravastatin (PRAVACHOL) 40 MG tablet Take 40 mg by mouth daily.   . promethazine-codeine (PHENERGAN WITH CODEINE) 6.25-10 MG/5ML syrup Take 5 mLs by mouth every 6 (six) hours as needed for cough.  . traMADol (ULTRAM) 50 MG tablet Take 50 mg by mouth every 8 (eight) hours as needed for pain.  . verapamil (VERELAN PM) 240 MG 24 hr capsule Take 240 mg by mouth daily.  . [DISCONTINUED] furosemide (LASIX) 40 MG tablet Take 1 tablet (40 mg total) by mouth 2  (two) times daily. (Patient not taking: Reported on 07/29/2016)  . [DISCONTINUED] promethazine-codeine (PHENERGAN WITH CODEINE) 6.25-10 MG/5ML syrup Take 5 mLs by mouth every 6 (six) hours as needed for cough. (Patient not taking: Reported on 07/29/2016)   No facility-administered encounter medications on file as of 07/29/2016.     Allergies: Erythromycin base; Lactose intolerance (gi); Lactulose; Mesalamine; Nitrofuran derivatives; Nitrofurantoin; Other; Sulfa antibiotics; Tdap [diphth-acell pertussis-tetanus]; Erythromycin; Tetanus toxoid, adsorbed; and Tetanus toxoids  Social History   Social History  . Marital status: Married    Spouse name: N/A  . Number of children: 2  . Years of education: N/A   Occupational History  . Not on file.   Social History Main Topics  . Smoking status: Never Smoker  . Smokeless tobacco: Never Used     Comment: positive passive tobacco smoke exposure  . Alcohol use Yes     Comment: glass of wine each night   . Drug use: No  . Sexual activity: Yes   Other Topics Concern  . Not on file   Social History Narrative   Exercise - at least 2 times weekly   Caffeine - 2 cups in the morning  Family History  Problem Relation Age of Onset  . Heart disease Mother   . Arthritis Mother   . Heart attack Father   . Diabetes Other     sibling  . Heart attack Other     sibling    Review of Systems: A 12-system review of systems was performed and was negative except as noted in the HPI.  --------------------------------------------------------------------------------------------------  Physical Exam: BP 110/60   Pulse 94   Ht 5' 1" (1.549 m)   Wt 179 lb 6.4 oz (81.4 kg)   BMI 33.90 kg/m   General:  Obese woman seated on the exam table.  She is accompanied by her husband. HEENT: No conjunctival pallor or scleral icterus.  Moist mucous membranes.  OP clear. Neck: Supple without lymphadenopathy, thyromegaly, JVD, or HJR. No carotid  bruits Lungs: Normal work of breathing.  Coarse breath sounds without wheezes or crackles. Fair air movement.Marland Kitchen Heart: Regular rate and rhythm without murmurs, rubs, or gallops.  Non-displaced PMI. Abd: Bowel sounds present.  Soft, NT/ND without hepatosplenomegaly Ext: No significant pretibial edema. Radial, PT, and DP pulses are 2+ bilaterally. Skin: warm and dry without rash  Lab Results  Component Value Date   WBC 11.5 (H) 01/21/2016   HGB 14.3 01/21/2016   HCT 43.1 01/21/2016   MCV 90.4 01/21/2016   PLT 488.0 (H) 01/21/2016    Lab Results  Component Value Date   NA 143 05/30/2016   K 4.0 05/30/2016   CL 106 05/30/2016   CO2 29 05/30/2016   BUN 19 05/30/2016   CREATININE 0.91 05/30/2016   GLUCOSE 72 05/30/2016   ALT 16 08/01/2013   --------------------------------------------------------------------------------------------------  ASSESSMENT AND PLAN: Shortness of breath Unfortunately, the patient has had a setback with her recent respiratory illness. I have personally reviewed her chest radiograph performed yesterday, which demonstrates a possible left lower lobe infiltrate versus atelectasis. [name] should complete the course of amoxicillin, as directed by Dr. Annamaria Boots. Cardiovascular testing, including echocardiogram and myocardial perfusion stress test, did not reveal an etiology for her profound exertional dyspnea. If her symptoms persist, we will proceed with heft and right heart catheterization. However, I would like for her to recover from her current respiratory infection. We will see her back in the office in about 6 weeks and will readdress proceeding with L/RHC at that time. The patient is planning to undergo home sleep test in the interim to evaluate for sleep apnea.  Leg edema Resolved with increased dose of furosemide. She is back down to 40 mg daily, which is reasonable for her to continue. I encouraged continued avoidance of salt and monitoring of her  weight.  Follow-up: Return to clinic in 6 weeks.  Nelva Bush, MD 07/31/2016 1:20 PM

## 2016-07-29 NOTE — Patient Instructions (Signed)
Medication Instructions:  Your physician recommends that you continue on your current medications as directed. Please refer to the Current Medication list given to you today.   Labwork: None   Testing/Procedures: None  Follow-Up: Your physician recommends that you schedule a follow-up appointment in: about 6 weeks with Dr Odette Horns is scheduled for Monday February 26,2018 at 10:40 AM.        If you need a refill on your cardiac medications before your next appointment, please call your pharmacy.

## 2016-07-31 ENCOUNTER — Encounter: Payer: Self-pay | Admitting: Internal Medicine

## 2016-08-04 ENCOUNTER — Ambulatory Visit: Payer: Medicare Other | Admitting: Internal Medicine

## 2016-08-04 DIAGNOSIS — Z79899 Other long term (current) drug therapy: Secondary | ICD-10-CM | POA: Diagnosis not present

## 2016-08-04 DIAGNOSIS — R05 Cough: Secondary | ICD-10-CM | POA: Diagnosis not present

## 2016-08-04 DIAGNOSIS — M0609 Rheumatoid arthritis without rheumatoid factor, multiple sites: Secondary | ICD-10-CM | POA: Diagnosis not present

## 2016-08-04 DIAGNOSIS — M79641 Pain in right hand: Secondary | ICD-10-CM | POA: Diagnosis not present

## 2016-08-04 DIAGNOSIS — M79642 Pain in left hand: Secondary | ICD-10-CM | POA: Diagnosis not present

## 2016-08-04 DIAGNOSIS — J069 Acute upper respiratory infection, unspecified: Secondary | ICD-10-CM | POA: Diagnosis not present

## 2016-08-10 ENCOUNTER — Other Ambulatory Visit: Payer: Self-pay | Admitting: Internal Medicine

## 2016-08-10 DIAGNOSIS — M19072 Primary osteoarthritis, left ankle and foot: Secondary | ICD-10-CM | POA: Diagnosis not present

## 2016-08-11 ENCOUNTER — Other Ambulatory Visit: Payer: Self-pay | Admitting: Internal Medicine

## 2016-08-12 DIAGNOSIS — G4733 Obstructive sleep apnea (adult) (pediatric): Secondary | ICD-10-CM

## 2016-08-26 DIAGNOSIS — G4733 Obstructive sleep apnea (adult) (pediatric): Secondary | ICD-10-CM | POA: Diagnosis not present

## 2016-08-31 ENCOUNTER — Other Ambulatory Visit: Payer: Self-pay | Admitting: *Deleted

## 2016-08-31 DIAGNOSIS — G4733 Obstructive sleep apnea (adult) (pediatric): Secondary | ICD-10-CM

## 2016-09-02 ENCOUNTER — Telehealth: Payer: Self-pay | Admitting: Internal Medicine

## 2016-09-02 ENCOUNTER — Encounter: Payer: Self-pay | Admitting: Internal Medicine

## 2016-09-02 DIAGNOSIS — J849 Interstitial pulmonary disease, unspecified: Secondary | ICD-10-CM

## 2016-09-02 MED ORDER — AMOXICILLIN-POT CLAVULANATE 875-125 MG PO TABS: 1.0000 | ORAL_TABLET | Freq: Two times a day (BID) | ORAL | 0 refills | Status: DC

## 2016-09-02 NOTE — Telephone Encounter (Signed)
Needs sputum sampled for AFB, bacteria, fungus Needs OV In meantime call in augmentin 875 bid x 5 days

## 2016-09-02 NOTE — Telephone Encounter (Signed)
Spoke with pt, who c/o prod cough with green mucus, wheezing, sweats mainly at nights & mild sore throat X 3w. Pt was prescribed amoxicillin at her 07-28-16 OV with CY, with mild improvement. Pt doing neb treatments tid, and muinex daily. Pt is also requesting sleep study results from 08-12-16. I have advised pt that CY is out of office until Monday.  Will also route to CY, to f/u on when back in office.   BQ please advise, as CY is off. Thanks.

## 2016-09-02 NOTE — Telephone Encounter (Signed)
Spoke with the pt and notified of recs per BQ  Pt verbalized understanding  Rx sent and labs ordered  [name], please advise when pt can see CDY- she only wants to see him  Thanks

## 2016-09-02 NOTE — Telephone Encounter (Signed)
The only opening we have is March 7,2018 at [facility]; pt will need to be seen by NP for acute visit in the meantime.

## 2016-09-05 NOTE — Telephone Encounter (Signed)
Spoke with pt. She is fine with seeing CY on 09/22/16. OV has been made. Nothing further was needed.

## 2016-09-07 ENCOUNTER — Other Ambulatory Visit: Payer: Medicare Other

## 2016-09-07 DIAGNOSIS — J849 Interstitial pulmonary disease, unspecified: Secondary | ICD-10-CM

## 2016-09-10 LAB — RESPIRATORY CULTURE OR RESPIRATORY AND SPUTUM CULTURE: Organism ID, Bacteria: NORMAL

## 2016-09-11 NOTE — Progress Notes (Signed)
 Follow-up Outpatient Visit Date: 09/12/2016  Chief Complaint: Shortness of breath  HPI:  [name] is a 70 y.o. year-old female with history of asthmatic bronchitis, GERD, rheumatoid arthritis, CVA (small bilateral basal ganglia lacunar infarcts noted by brain MRI in 11/2012), prediabetes with hemoglobin A1c of 6.0 and 2014, irritable bowel syndrome, and allergic rhinitis, who presents for follow-up of shortness of breath. I last saw her on 07/29/16, at which time she was recovering from a protracted respiratory infection that had exacerbated her baseline dyspnea. We agreed to defer further testing until resolution of her acute illness. She also underwent sleep study in late January, which showed moderate OSA.  Patient reports that she is finally feeling better after a course of Augmentin. However, she continues to have significant shortness of breath even walking across the room, as well as wheezing. She has been using an albuterol nebulizer 3 times a day with improvement. She is nice chest pain. Leg swelling has been well-controlled with daily furosemide. She remains concerned that her breathing is still much worse compared to 6-12 months ago. She is scheduled to see Dr. Young next month to discuss results of sleep study.  --------------------------------------------------------------------------------------------------  Cardiovascular History & Procedures: Cardiovascular Problems:  Shortness of breath  Coronary artery calcification  Stroke  Risk Factors:  History of stroke, borderline diabetes, hyperlipidemia, obesity, and chronic steroid use  Cath/PCI:  None  CV Surgery:  None  EP Procedures and Devices:  None  Non-Invasive Evaluation(s):  Transthoracic echocardiogram (05/11/16): Normal LV size and in traction (EF 55-60%). Normal LV wall motion. Grade 1 diastolic. Normal RV size and function. Mild mitral valve thickening. Mild TR.  Pharmacologic myocardial perfusion stress  test (05/11/16): Low risk study without ischemia or scar. LVEF 62%.  Bilateral lower extremity venous duplex (04/07/16): No evidence of DVT though left peroneal vein was suboptimally imaged.  Transthoracic echocardiogram (12/17/12): Normal LV size with abnormal septal motion and EF of 50%. Grade 1 diastolic dysfunction noted. Aortic sclerosis without stenosis or regurgitation evident. Trivial MR and TR without other significant valvular disease. Normal PA systolic and central venous pressures.  Bilateral lower extremity arterial duplex (12/05/12): Bilateral ABIs 1.2. No significant stenosis identified.  Recent CV Pertinent Labs: Lab Results  Component Value Date   INR 0.90 09/03/2010   K 4.0 05/30/2016   BUN 19 05/30/2016   BUN 12 11/15/2012   CREATININE 0.91 05/30/2016    Past medical and surgical history were reviewed and updated in EPIC.   Outpatient Encounter Prescriptions as of 09/12/2016  Medication Sig  . albuterol (PROAIR HFA) 108 (90 Base) MCG/ACT inhaler Inhale 2 puffs into the lungs every 4 (four) hours as needed for wheezing or shortness of breath.  . albuterol (PROVENTIL) (2.5 MG/3ML) 0.083% nebulizer solution UE 1 VIAL IN NEBULIZER EVERY 6 HOURS AS NEEDED FOR WHEEZING OR SHORTNESS OF BREATH  . aspirin EC 81 MG tablet Take 81 mg by mouth daily.  . benzonatate (TESSALON) 100 MG capsule TAKE 1 CAPSULE (100 MG TOTAL) BY MOUTH 4 (FOUR) TIMES DAILY AS NEEDED FOR COUGH.  . cetirizine (ZYRTEC) 10 MG tablet Take 10 mg by mouth daily.  . colestipol (COLESTID) 1 G tablet Take 1 g by mouth 2 (two) times daily. Take 1 tablet in am and 1 tablet in pm.  . fluconazole (DIFLUCAN) 150 MG tablet Take 1 tablet (150 mg total) by mouth daily.  . FLUoxetine (PROZAC) 20 MG tablet Take 20 mg by mouth daily.    . fluticasone (FLONASE)   50 MCG/ACT nasal spray Place 2 sprays into the nose daily.   . furosemide (LASIX) 40 MG tablet Take 40 mg by mouth daily.  Marland Kitchen glycopyrrolate (ROBINUL) 1 MG tablet Take 1  mg by mouth 3 (three) times daily as needed (for IBS).   . Glycopyrrolate-Formoterol (BEVESPI AEROSPHERE) 9-4.8 MCG/ACT AERO Inhale 2 puffs into the lungs 2 (two) times daily.  . hydroxychloroquine (PLAQUENIL) 200 MG tablet Take 200 mg by mouth 3 (three) times daily.  Marland Kitchen ipratropium (ATROVENT) 0.03 % nasal spray USE 1 TO 2 SPRAYS INTO BOTH NOSTRILS 2 (TWO) TIMES  DAILY.  Marland Kitchen leflunomide (ARAVA) 20 MG tablet Take 20 mg by mouth daily.   . Melatonin 10 MG TABS Take 10 mg by mouth at bedtime.  . mirtazapine (REMERON) 15 MG tablet Take 45 mg by mouth at bedtime.   . montelukast (SINGULAIR) 10 MG tablet TAKE 1 TABLET BY MOUTH AT  BEDTIME  . Multiple Vitamin (MULTIVITAMIN) tablet Take 1 tablet by mouth daily.  . NON FORMULARY Take 1 Dose by mouth daily. Chlordiazepoxide-clidinium   . omeprazole (PRILOSEC) 40 MG capsule Take 40 mg by mouth daily.  . potassium chloride SA (K-DUR,KLOR-CON) 20 MEQ tablet Take 20 mEq by mouth 3 (three) times daily.  . pravastatin (PRAVACHOL) 40 MG tablet Take 40 mg by mouth daily.   . promethazine-codeine (PHENERGAN WITH CODEINE) 6.25-10 MG/5ML syrup Take 5 mLs by mouth every 6 (six) hours as needed for cough.  . traMADol (ULTRAM) 50 MG tablet Take 50 mg by mouth every 8 (eight) hours as needed for pain.  . verapamil (VERELAN PM) 240 MG 24 hr capsule Take 240 mg by mouth daily.  . [DISCONTINUED] amoxicillin (AMOXIL) 500 MG tablet Take 1 tablet (500 mg total) by mouth 2 (two) times daily.  . [DISCONTINUED] amoxicillin-clavulanate (AUGMENTIN) 875-125 MG tablet Take 1 tablet by mouth 2 (two) times daily.  . [DISCONTINUED] Insulin Syringe-Needle U-100 (B-D INSULIN SYRINGE 1CC/25G) 25G X 5/8" 1 ML MISC Use as directed weekly for allergy injections  . [DISCONTINUED] NONFORMULARY OR COMPOUNDED ITEM Allergy Vaccine 1:10 Given at Home   No facility-administered encounter medications on file as of 09/12/2016.     Allergies: Erythromycin base; Lactose intolerance (gi); Lactulose;  Mesalamine; Nitrofuran derivatives; Nitrofurantoin; Other; Sulfa antibiotics; Tdap [diphth-acell pertussis-tetanus]; Erythromycin; Tetanus toxoid, adsorbed; and Tetanus toxoids  Social History   Social History  . Marital status: Married    Spouse name: N/A  . Number of children: 2  . Years of education: N/A   Occupational History  . Not on file.   Social History Main Topics  . Smoking status: Never Smoker  . Smokeless tobacco: Never Used     Comment: positive passive tobacco smoke exposure  . Alcohol use Yes     Comment: glass of wine each night   . Drug use: No  . Sexual activity: Yes   Other Topics Concern  . Not on file   Social History Narrative   Exercise - at least 2 times weekly   Caffeine - 2 cups in the morning          Family History  Problem Relation Age of Onset  . Heart disease Mother   . Arthritis Mother   . Heart attack Father   . Diabetes Other     sibling  . Heart attack Other     sibling    Review of Systems: A 12-system review of systems was performed and was negative except as noted in the HPI.   --------------------------------------------------------------------------------------------------  Physical Exam: BP 126/74 (BP Location: Right Arm)   Pulse (!) 101   Ht 5' 1" (1.549 m)   Wt 173 lb 6.4 oz (78.7 kg)   BMI 32.76 kg/m   General:  Obese woman seated on the exam table.  She is accompanied by her husband. HEENT: No conjunctival pallor or scleral icterus.  Moist mucous membranes.  OP clear. Neck: Supple without lymphadenopathy, thyromegaly, JVD, or HJR. Lungs: Normal work of breathing.  Mildly diminished breath sounds throughout without wheezes or crackles. Heart: Regular rate and rhythm without murmurs, rubs, or gallops.  Non-displaced PMI. Abd: Bowel sounds present.  Soft, NT/ND without hepatosplenomegaly Ext: Trace pretibial edema bilaterally. Radial, PT, and DP pulses are 2+ bilaterally. Skin: warm and dry without rash  EKG:  Normal sinus rhythm with left axis deviation and poor R-wave progression. Left axis deviation and poor R-wave progression are new since 04/22/16 (I have personally reviewed both tracings).  Lab Results  Component Value Date   WBC 11.5 (H) 01/21/2016   HGB 14.3 01/21/2016   HCT 43.1 01/21/2016   MCV 90.4 01/21/2016   PLT 488.0 (H) 01/21/2016    Lab Results  Component Value Date   NA 143 05/30/2016   K 4.0 05/30/2016   CL 106 05/30/2016   CO2 29 05/30/2016   BUN 19 05/30/2016   CREATININE 0.91 05/30/2016   GLUCOSE 72 05/30/2016   ALT 16 08/01/2013   --------------------------------------------------------------------------------------------------  ASSESSMENT AND PLAN: Shortness of breath This has improved with continued antibiotic therapy and regular nebulizer treatments. Patient continues to have considerable exertional dyspnea just walking across the room. Cardiac evaluation up to this point has been largely unrevealing. Recent sleep study demonstrated moderate obstructive sleep apnea. Given her marked exertional dyspnea and EKG changes seen today (poor R-wave progression with left axis deviation, new), we have agreed to proceed with left and right heart catheterization to exclude pulmonary hypertension, restrictive/constrictive physiology, and CAD that was not evident on her previous myocardial perfusion stress test. I have reviewed the risks, indications, and alternatives to cardiac catheterization, possible angioplasty, and stenting with the patient. Risks include but are not limited to bleeding, infection, vascular injury, stroke, myocardial infection, arrhythmia, kidney injury, radiation-related injury in the case of prolonged fluoroscopy use, emergency cardiac surgery, and death. The patient understands the risks of serious complication is 1-2 in 5001 with diagnostic cardiac cath and 1-2% or less with angioplasty/stenting. We will obtain pre-catheterization labs (CBC, BMP, and  INR).  Leg edema Trace pretibial edema noted today. We will continue with her current furosemide dose.  Obstructive sleep apnea Moderate OSA noted on recent home sleep study ordered by Dr. Annamaria Boots. Patient should follow-up with Dr. Annamaria Boots as scheduled to discuss these results further and arrange for CPAP titration.  Follow-up: Return to clinic as needed based on results of cardiac catheterization.  Nelva Bush, MD 09/12/2016 11:49 AM

## 2016-09-12 ENCOUNTER — Encounter: Payer: Self-pay | Admitting: Internal Medicine

## 2016-09-12 ENCOUNTER — Encounter: Payer: Self-pay | Admitting: *Deleted

## 2016-09-12 ENCOUNTER — Ambulatory Visit (INDEPENDENT_AMBULATORY_CARE_PROVIDER_SITE_OTHER): Payer: Medicare Other | Admitting: Internal Medicine

## 2016-09-12 VITALS — BP 126/74 | HR 101 | Ht 61.0 in | Wt 173.4 lb

## 2016-09-12 DIAGNOSIS — G4733 Obstructive sleep apnea (adult) (pediatric): Secondary | ICD-10-CM | POA: Diagnosis not present

## 2016-09-12 DIAGNOSIS — R600 Localized edema: Secondary | ICD-10-CM | POA: Diagnosis not present

## 2016-09-12 DIAGNOSIS — R0602 Shortness of breath: Secondary | ICD-10-CM | POA: Diagnosis not present

## 2016-09-12 LAB — CBC WITH DIFFERENTIAL/PLATELET
Basophils Absolute: 0.1 10*3/uL (ref 0.0–0.2)
Basos: 1 %
EOS (ABSOLUTE): 0 10*3/uL (ref 0.0–0.4)
Eos: 1 %
Hematocrit: 43.8 % (ref 34.0–46.6)
Hemoglobin: 14.1 g/dL (ref 11.1–15.9)
Immature Grans (Abs): 0 10*3/uL (ref 0.0–0.1)
Immature Granulocytes: 1 %
Lymphocytes Absolute: 1.5 10*3/uL (ref 0.7–3.1)
Lymphs: 23 %
MCH: 28 pg (ref 26.6–33.0)
MCHC: 32.2 g/dL (ref 31.5–35.7)
MCV: 87 fL (ref 79–97)
Monocytes Absolute: 1 10*3/uL — ABNORMAL HIGH (ref 0.1–0.9)
Monocytes: 15 %
Neutrophils Absolute: 3.9 10*3/uL (ref 1.4–7.0)
Neutrophils: 59 %
Platelets: 307 10*3/uL (ref 150–379)
RBC: 5.04 x10E6/uL (ref 3.77–5.28)
RDW: 15.1 % (ref 12.3–15.4)
WBC: 6.6 10*3/uL (ref 3.4–10.8)

## 2016-09-12 LAB — PROTIME-INR
INR: 1 (ref 0.8–1.2)
Prothrombin Time: 10.9 s (ref 9.1–12.0)

## 2016-09-12 LAB — BASIC METABOLIC PANEL
BUN/Creatinine Ratio: 14 (ref 12–28)
BUN: 13 mg/dL (ref 8–27)
CO2: 20 mmol/L (ref 18–29)
Calcium: 10.1 mg/dL (ref 8.7–10.3)
Chloride: 102 mmol/L (ref 96–106)
Creatinine, Ser: 0.9 mg/dL (ref 0.57–1.00)
GFR calc Af Amer: 74 mL/min/{1.73_m2} (ref >59)
GFR calc non Af Amer: 64 mL/min/{1.73_m2} (ref >59)
Glucose: 86 mg/dL (ref 65–99)
Potassium: 4.4 mmol/L (ref 3.5–5.2)
Sodium: 141 mmol/L (ref 134–144)

## 2016-09-12 NOTE — Patient Instructions (Signed)
Medication Instructions:  Your physician recommends that you continue on your current medications as directed. Please refer to the Current Medication list given to you today.   Labwork: BMET/CBCd/PT/INR today.  Testing/Procedures: Your physician has requested that you have a cardiac catheterization. Cardiac catheterization is used to diagnose and/or treat various heart conditions. Doctors may recommend this procedure for a number of different reasons. The most common reason is to evaluate chest pain. Chest pain can be a symptom of coronary artery disease (CAD), and cardiac catheterization can show whether plaque is narrowing or blocking your heart's arteries. This procedure is also used to evaluate the valves, as well as measure the blood flow and oxygen levels in different parts of your heart. For further information please visit [email or web address]. Please follow instruction sheet, as given. Right and Left Catheterization.   Follow-Up: Your physician recommends that you schedule a follow-up appointment as needed with Dr End.         If you need a refill on your cardiac medications before your next appointment, please call your pharmacy.

## 2016-09-16 ENCOUNTER — Encounter (HOSPITAL_COMMUNITY)
Admission: RE | Disposition: A | Discharge status: Home / Self Care | Payer: Self-pay | Source: Ambulatory Visit | Attending: Internal Medicine

## 2016-09-16 ENCOUNTER — Ambulatory Visit (HOSPITAL_COMMUNITY)
Admission: RE | Admit: 2016-09-16 | Discharge: 2016-09-16 | Disposition: A | Discharge status: Home / Self Care | Payer: Medicare Other | Source: Ambulatory Visit | Attending: Internal Medicine | Admitting: Internal Medicine

## 2016-09-16 DIAGNOSIS — E739 Lactose intolerance, unspecified: Secondary | ICD-10-CM | POA: Diagnosis not present

## 2016-09-16 DIAGNOSIS — R0602 Shortness of breath: Secondary | ICD-10-CM | POA: Diagnosis not present

## 2016-09-16 DIAGNOSIS — J45909 Unspecified asthma, uncomplicated: Secondary | ICD-10-CM | POA: Diagnosis not present

## 2016-09-16 DIAGNOSIS — M069 Rheumatoid arthritis, unspecified: Secondary | ICD-10-CM | POA: Insufficient documentation

## 2016-09-16 DIAGNOSIS — I2510 Atherosclerotic heart disease of native coronary artery without angina pectoris: Secondary | ICD-10-CM | POA: Diagnosis not present

## 2016-09-16 DIAGNOSIS — K219 Gastro-esophageal reflux disease without esophagitis: Secondary | ICD-10-CM | POA: Insufficient documentation

## 2016-09-16 DIAGNOSIS — Z7951 Long term (current) use of inhaled steroids: Secondary | ICD-10-CM | POA: Insufficient documentation

## 2016-09-16 DIAGNOSIS — Z7982 Long term (current) use of aspirin: Secondary | ICD-10-CM | POA: Insufficient documentation

## 2016-09-16 DIAGNOSIS — Z6832 Body mass index (BMI) 32.0-32.9, adult: Secondary | ICD-10-CM | POA: Insufficient documentation

## 2016-09-16 DIAGNOSIS — Z882 Allergy status to sulfonamides status: Secondary | ICD-10-CM | POA: Diagnosis not present

## 2016-09-16 DIAGNOSIS — R600 Localized edema: Secondary | ICD-10-CM | POA: Diagnosis not present

## 2016-09-16 DIAGNOSIS — E669 Obesity, unspecified: Secondary | ICD-10-CM | POA: Diagnosis not present

## 2016-09-16 DIAGNOSIS — E785 Hyperlipidemia, unspecified: Secondary | ICD-10-CM | POA: Insufficient documentation

## 2016-09-16 DIAGNOSIS — Z7952 Long term (current) use of systemic steroids: Secondary | ICD-10-CM | POA: Insufficient documentation

## 2016-09-16 DIAGNOSIS — I272 Pulmonary hypertension, unspecified: Secondary | ICD-10-CM | POA: Diagnosis not present

## 2016-09-16 DIAGNOSIS — K589 Irritable bowel syndrome without diarrhea: Secondary | ICD-10-CM | POA: Insufficient documentation

## 2016-09-16 DIAGNOSIS — Z7722 Contact with and (suspected) exposure to environmental tobacco smoke (acute) (chronic): Secondary | ICD-10-CM | POA: Diagnosis not present

## 2016-09-16 DIAGNOSIS — I2584 Coronary atherosclerosis due to calcified coronary lesion: Secondary | ICD-10-CM | POA: Diagnosis not present

## 2016-09-16 DIAGNOSIS — Z8249 Family history of ischemic heart disease and other diseases of the circulatory system: Secondary | ICD-10-CM | POA: Diagnosis not present

## 2016-09-16 DIAGNOSIS — Z8673 Personal history of transient ischemic attack (TIA), and cerebral infarction without residual deficits: Secondary | ICD-10-CM | POA: Diagnosis not present

## 2016-09-16 DIAGNOSIS — R7303 Prediabetes: Secondary | ICD-10-CM | POA: Insufficient documentation

## 2016-09-16 DIAGNOSIS — G4733 Obstructive sleep apnea (adult) (pediatric): Secondary | ICD-10-CM | POA: Diagnosis not present

## 2016-09-16 HISTORY — PX: RIGHT/LEFT HEART CATH AND CORONARY ANGIOGRAPHY: CATH118266

## 2016-09-16 LAB — POCT I-STAT 3, ART BLOOD GAS (G3+)
Acid-base deficit: 3 mmol/L — ABNORMAL HIGH (ref 0.0–2.0)
Bicarbonate: 21.9 mmol/L (ref 20.0–28.0)
O2 Saturation: 94 %
TCO2: 23 mmol/L (ref 0–100)
pCO2 arterial: 38.3 mmHg (ref 32.0–48.0)
pH, Arterial: 7.366 (ref 7.350–7.450)
pO2, Arterial: 72 mmHg — ABNORMAL LOW (ref 83.0–108.0)

## 2016-09-16 LAB — POCT I-STAT 3, VENOUS BLOOD GAS (G3P V)
Acid-base deficit: 1 mmol/L (ref 0.0–2.0)
Acid-base deficit: 2 mmol/L (ref 0.0–2.0)
Bicarbonate: 23.3 mmol/L (ref 20.0–28.0)
Bicarbonate: 23.7 mmol/L (ref 20.0–28.0)
O2 Saturation: 70 %
O2 Saturation: 75 %
TCO2: 25 mmol/L (ref 0–100)
TCO2: 25 mmol/L (ref 0–100)
pCO2, Ven: 39.6 mmHg — ABNORMAL LOW (ref 44.0–60.0)
pCO2, Ven: 40.6 mmHg — ABNORMAL LOW (ref 44.0–60.0)
pH, Ven: 7.374 (ref 7.250–7.430)
pH, Ven: 7.378 (ref 7.250–7.430)
pO2, Ven: 37 mmHg (ref 32.0–45.0)
pO2, Ven: 41 mmHg (ref 32.0–45.0)

## 2016-09-16 SURGERY — RIGHT/LEFT HEART CATH AND CORONARY ANGIOGRAPHY

## 2016-09-16 MED ORDER — HEPARIN (PORCINE) IN NACL 2-0.9 UNIT/ML-% IJ SOLN
INTRAMUSCULAR | Status: AC
  Filled 2016-09-16: qty 1000

## 2016-09-16 MED ORDER — SODIUM CHLORIDE 0.9 % IV SOLN: INTRAVENOUS | Status: DC

## 2016-09-16 MED ORDER — LIDOCAINE HCL (PF) 1 % IJ SOLN
INTRAMUSCULAR | Status: DC | PRN
  Administered 2016-09-16: 2 mL via INTRADERMAL
  Administered 2016-09-16: 2 mL

## 2016-09-16 MED ORDER — IOPAMIDOL (ISOVUE-370) INJECTION 76%
INTRAVENOUS | Status: DC | PRN
  Administered 2016-09-16: 65 mL via INTRA_ARTERIAL

## 2016-09-16 MED ORDER — SODIUM CHLORIDE 0.9 % IV SOLN: 250.0000 mL | INTRAVENOUS | Status: DC | PRN

## 2016-09-16 MED ORDER — FENTANYL CITRATE (PF) 100 MCG/2ML IJ SOLN
INTRAMUSCULAR | Status: DC | PRN
  Administered 2016-09-16 (×2): 12.5 ug via INTRAVENOUS

## 2016-09-16 MED ORDER — SODIUM CHLORIDE 0.9% FLUSH: 3.0000 mL | INTRAVENOUS | Status: DC | PRN

## 2016-09-16 MED ORDER — ASPIRIN 81 MG PO CHEW: 81.0000 mg | CHEWABLE_TABLET | ORAL | Status: DC

## 2016-09-16 MED ORDER — VERAPAMIL HCL 2.5 MG/ML IV SOLN
INTRAVENOUS | Status: AC
  Filled 2016-09-16: qty 2

## 2016-09-16 MED ORDER — SODIUM CHLORIDE 0.9% FLUSH: 3.0000 mL | Freq: Two times a day (BID) | INTRAVENOUS | Status: DC

## 2016-09-16 MED ORDER — HEPARIN SODIUM (PORCINE) 1000 UNIT/ML IJ SOLN
INTRAMUSCULAR | Status: AC
  Filled 2016-09-16: qty 1

## 2016-09-16 MED ORDER — FENTANYL CITRATE (PF) 100 MCG/2ML IJ SOLN
INTRAMUSCULAR | Status: AC
  Filled 2016-09-16: qty 2

## 2016-09-16 MED ORDER — FUROSEMIDE 40 MG PO TABS: 40.0000 mg | ORAL_TABLET | Freq: Two times a day (BID) | ORAL | 3 refills | Status: DC

## 2016-09-16 MED ORDER — POTASSIUM CHLORIDE CRYS ER 20 MEQ PO TBCR: 40.0000 meq | EXTENDED_RELEASE_TABLET | Freq: Two times a day (BID) | ORAL | 3 refills | Status: DC

## 2016-09-16 MED ORDER — SODIUM CHLORIDE 0.9 % WEIGHT BASED INFUSION
3.0000 mL/kg/h | INTRAVENOUS | Status: AC
  Administered 2016-09-16: 3 mL/kg/h via INTRAVENOUS

## 2016-09-16 MED ORDER — MIDAZOLAM HCL 2 MG/2ML IJ SOLN
INTRAMUSCULAR | Status: AC
  Filled 2016-09-16: qty 2

## 2016-09-16 MED ORDER — MIDAZOLAM HCL 2 MG/2ML IJ SOLN
INTRAMUSCULAR | Status: DC | PRN
  Administered 2016-09-16 (×2): 0.5 mg via INTRAVENOUS

## 2016-09-16 MED ORDER — HEPARIN (PORCINE) IN NACL 2-0.9 UNIT/ML-% IJ SOLN
INTRAMUSCULAR | Status: DC | PRN
  Administered 2016-09-16: 1000 mL

## 2016-09-16 MED ORDER — VERAPAMIL HCL 2.5 MG/ML IV SOLN
INTRAVENOUS | Status: DC | PRN
  Administered 2016-09-16: 10 mL via INTRA_ARTERIAL

## 2016-09-16 MED ORDER — ROSUVASTATIN CALCIUM 20 MG PO TABS: 20.0000 mg | ORAL_TABLET | Freq: Every day | ORAL | 5 refills | Status: DC

## 2016-09-16 MED ORDER — SODIUM CHLORIDE 0.9 % WEIGHT BASED INFUSION: 1.0000 mL/kg/h | INTRAVENOUS | Status: DC

## 2016-09-16 MED ORDER — LIDOCAINE HCL (PF) 1 % IJ SOLN
INTRAMUSCULAR | Status: AC
  Filled 2016-09-16: qty 30

## 2016-09-16 SURGICAL SUPPLY — 17 items
CATH BALLN WEDGE 5F 110CM (CATHETERS) ×1 IMPLANT
CATH IMPULSE 5F ANG/FL3.5 (CATHETERS) ×1 IMPLANT
COVER PRB 48X5XTLSCP FOLD TPE (BAG) IMPLANT
COVER PROBE 5X48 (BAG) ×2
DEVICE RAD COMP TR BAND LRG (VASCULAR PRODUCTS) ×1 IMPLANT
GLIDESHEATH SLEND SS 6F .021 (SHEATH) ×1 IMPLANT
GUIDEWIRE .025 260CM (WIRE) ×1 IMPLANT
GUIDEWIRE INQWIRE 1.5J.035X260 (WIRE) IMPLANT
INQWIRE 1.5J .035X260CM (WIRE) ×2
KIT HEART LEFT (KITS) ×2 IMPLANT
KIT HEART RIGHT NAMIC (KITS) ×1 IMPLANT
PACK CARDIAC CATHETERIZATION (CUSTOM PROCEDURE TRAY) ×2 IMPLANT
TRANSDUCER W/STOPCOCK (MISCELLANEOUS) ×3 IMPLANT
TUBING ART PRESS 72  MALE/FEM (TUBING) ×1
TUBING ART PRESS 72 MALE/FEM (TUBING) IMPLANT
TUBING CIL FLEX 10 FLL-RA (TUBING) ×2 IMPLANT
WIRE HI TORQ VERSACORE-J 145CM (WIRE) ×1 IMPLANT

## 2016-09-16 NOTE — Interval H&P Note (Signed)
History and Physical Interval Note:  09/16/2016 7:27 AM  [name]  has presented today for cardiac catheterization, with the diagnosis of shortness of breath  The various methods of treatment have been discussed with the patient and family. After consideration of risks, benefits and other options for treatment, the patient has consented to  Procedure(s): Right/Left Heart Cath and Coronary Angiography (N/A) as a surgical intervention .  The patient's history has been reviewed, patient examined, no change in status, stable for surgery.  I have reviewed the patient's chart and labs.  Questions were answered to the patient's satisfaction.    Cath Lab Visit (complete for each Cath Lab visit)  Clinical Evaluation Leading to the Procedure:   ACS: No.  Non-ACS:    Anginal Classification: CCS III (shortness of breath)  Anti-ischemic medical therapy: No Therapy  Non-Invasive Test Results: Low-risk stress test findings: cardiac mortality <1%/year  Prior CABG: No previous CABG   Tyneisha Hegeman

## 2016-09-16 NOTE — Brief Op Note (Signed)
Brief Cardiac Catheterization Note (Full Report to Follow)  Date: 09/16/2016 Time: 10:01 AM  PATIENT:  [name]  71 y.o. female  PRE-OPERATIVE DIAGNOSIS:  shortness of breath  POST-OPERATIVE DIAGNOSIS:  Non-obstructive CAD, pulmonary hypertension  PROCEDURE:  Procedure(s): Right/Left Heart Cath and Coronary Angiography (N/A)  SURGEON:  Surgeon(s) and Role:    * Nelva Bush, MD - Primary  FINDINGS: 1. Mild to moderate, non-obstructive coronary artery disease. 2. Mild to moderate pulmonary hypertension with mildly elevated right heart filling pressure. 3. Normal Fick cardiac output/index.  RECOMMENDATIONS: 1. Medical therapy and risk-factor modification to prevent progression of CAD. 2. Increase furosemide to 40 mg BID. 3. Follow-up with Dr. Annamaria Boots for further evaluation/management of pulmonary disease and pulmonary hypertension.  Nelva Bush, MD [facility] Pager: [phone]

## 2016-09-16 NOTE — Discharge Instructions (Signed)
Radial Site Care Refer to this sheet in the next few weeks. These instructions provide you with information about caring for yourself after your procedure. Your health care provider may also give you more specific instructions. Your treatment has been planned according to current medical practices, but problems sometimes occur. Call your health care provider if you have any problems or questions after your procedure. What can I expect after the procedure? After your procedure, it is typical to have the following:  Bruising at the radial site that usually fades within 1-2 weeks.  Blood collecting in the tissue (hematoma) that may be painful to the touch. It should usually decrease in size and tenderness within 1-2 weeks. Follow these instructions at home:  Take medicines only as directed by your health care provider.  You may shower 24-48 hours after the procedure or as directed by your health care provider. Remove the bandage (dressing) and gently wash the site with plain soap and water. Pat the area dry with a clean towel. Do not rub the site, because this may cause bleeding.  Do not take baths, swim, or use a hot tub until your health care provider approves.  Check your insertion site every day for redness, swelling, or drainage.  Do not apply powder or lotion to the site.  Do not flex or bend the affected arm for 24 hours or as directed by your health care provider.  Do not push or pull heavy objects with the affected arm for 24 hours or as directed by your health care provider.  Do not lift over 10 lb (4.5 kg) for 5 days after your procedure or as directed by your health care provider.  Ask your health care provider when it is okay to:  Return to work or school.  Resume usual physical activities or sports.  Resume sexual activity.  Do not drive home if you are discharged the same day as the procedure. Have someone else drive you.  You may drive 24 hours after the procedure  unless otherwise instructed by your health care provider.  Do not operate machinery or power tools for 24 hours after the procedure.  If your procedure was done as an outpatient procedure, which means that you went home the same day as your procedure, a responsible adult should be with you for the first 24 hours after you arrive home.  Keep all follow-up visits as directed by your health care provider. This is important. Contact a health care provider if:  You have a fever.  You have chills.  You have increased bleeding from the radial site. Hold pressure on the site and call 911. Get help right away if:  You have unusual pain at the radial site.  You have redness, warmth, or swelling at the radial site.  You have drainage (other than a small amount of blood on the dressing) from the radial site.  The radial site is bleeding, hold steady pressure on the site and call 911.  Your arm or hand becomes pale, cool, tingly, or numb. This information is not intended to replace advice given to you by your health care provider. Make sure you discuss any questions you have with your health care provider. Document Released: 08/06/2010 Document Revised: 12/10/2015 Document Reviewed: 01/20/2014 Elsevier Interactive Patient Education  2017 [employer].

## 2016-09-16 NOTE — H&P (View-Only) (Signed)
Follow-up Outpatient Visit Date: 09/12/2016  Chief Complaint: Shortness of breath  HPI:  [name] is a 71 y.o. year-old female with history of asthmatic bronchitis, GERD, rheumatoid arthritis, CVA (small bilateral basal ganglia lacunar infarcts noted by brain MRI in 11/2012), prediabetes with hemoglobin A1c of 6.0 and 2014, irritable bowel syndrome, and allergic rhinitis, who presents for follow-up of shortness of breath. I last saw her on 07/29/16, at which time she was recovering from a protracted respiratory infection that had exacerbated her baseline dyspnea. We agreed to defer further testing until resolution of her acute illness. She also underwent sleep study in late January, which showed moderate OSA.  Patient reports that she is finally feeling better after a course of Augmentin. However, she continues to have significant shortness of breath even walking across the room, as well as wheezing. She has been using an albuterol nebulizer 3 times a day with improvement. She is nice chest pain. Leg swelling has been well-controlled with daily furosemide. She remains concerned that her breathing is still much worse compared to 6-12 months ago. She is scheduled to see Dr. Annamaria Boots next month to discuss results of sleep study.  --------------------------------------------------------------------------------------------------  Cardiovascular History & Procedures: Cardiovascular Problems:  Shortness of breath  Coronary artery calcification  Stroke  Risk Factors:  History of stroke, borderline diabetes, hyperlipidemia, obesity, and chronic steroid use  Cath/PCI:  None  CV Surgery:  None  EP Procedures and Devices:  None  Non-Invasive Evaluation(s):  Transthoracic echocardiogram (05/11/16): Normal LV size and in traction (EF 55-60%). Normal LV wall motion. Grade 1 diastolic. Normal RV size and function. Mild mitral valve thickening. Mild TR.  Pharmacologic myocardial perfusion stress  test (05/11/16): Low risk study without ischemia or scar. LVEF 62%.  Bilateral lower extremity venous duplex (04/07/16): No evidence of DVT though left peroneal vein was suboptimally imaged.  Transthoracic echocardiogram (12/17/12): Normal LV size with abnormal septal motion and EF of 50%. Grade 1 diastolic dysfunction noted. Aortic sclerosis without stenosis or regurgitation evident. Trivial MR and TR without other significant valvular disease. Normal PA systolic and central venous pressures.  Bilateral lower extremity arterial duplex (12/05/12): Bilateral ABIs 1.2. No significant stenosis identified.  Recent CV Pertinent Labs: Lab Results  Component Value Date   INR 0.90 09/03/2010   K 4.0 05/30/2016   BUN 19 05/30/2016   BUN 12 11/15/2012   CREATININE 0.91 05/30/2016    Past medical and surgical history were reviewed and updated in EPIC.   Outpatient Encounter Prescriptions as of 09/12/2016  Medication Sig  . albuterol (PROAIR HFA) 108 (90 Base) MCG/ACT inhaler Inhale 2 puffs into the lungs every 4 (four) hours as needed for wheezing or shortness of breath.  Marland Kitchen albuterol (PROVENTIL) (2.5 MG/3ML) 0.083% nebulizer solution UE 1 VIAL IN NEBULIZER EVERY 6 HOURS AS NEEDED FOR WHEEZING OR SHORTNESS OF BREATH  . aspirin EC 81 MG tablet Take 81 mg by mouth daily.  . benzonatate (TESSALON) 100 MG capsule TAKE 1 CAPSULE (100 MG TOTAL) BY MOUTH 4 (FOUR) TIMES DAILY AS NEEDED FOR COUGH.  . cetirizine (ZYRTEC) 10 MG tablet Take 10 mg by mouth daily.  . colestipol (COLESTID) 1 G tablet Take 1 g by mouth 2 (two) times daily. Take 1 tablet in am and 1 tablet in pm.  . fluconazole (DIFLUCAN) 150 MG tablet Take 1 tablet (150 mg total) by mouth daily.  Marland Kitchen FLUoxetine (PROZAC) 20 MG tablet Take 20 mg by mouth daily.    . fluticasone (FLONASE)  50 MCG/ACT nasal spray Place 2 sprays into the nose daily.   . furosemide (LASIX) 40 MG tablet Take 40 mg by mouth daily.  Marland Kitchen glycopyrrolate (ROBINUL) 1 MG tablet Take 1  mg by mouth 3 (three) times daily as needed (for IBS).   . Glycopyrrolate-Formoterol (BEVESPI AEROSPHERE) 9-4.8 MCG/ACT AERO Inhale 2 puffs into the lungs 2 (two) times daily.  . hydroxychloroquine (PLAQUENIL) 200 MG tablet Take 200 mg by mouth 3 (three) times daily.  Marland Kitchen ipratropium (ATROVENT) 0.03 % nasal spray USE 1 TO 2 SPRAYS INTO BOTH NOSTRILS 2 (TWO) TIMES  DAILY.  Marland Kitchen leflunomide (ARAVA) 20 MG tablet Take 20 mg by mouth daily.   . Melatonin 10 MG TABS Take 10 mg by mouth at bedtime.  . mirtazapine (REMERON) 15 MG tablet Take 45 mg by mouth at bedtime.   . montelukast (SINGULAIR) 10 MG tablet TAKE 1 TABLET BY MOUTH AT  BEDTIME  . Multiple Vitamin (MULTIVITAMIN) tablet Take 1 tablet by mouth daily.  . NON FORMULARY Take 1 Dose by mouth daily. Chlordiazepoxide-clidinium   . omeprazole (PRILOSEC) 40 MG capsule Take 40 mg by mouth daily.  . potassium chloride SA (K-DUR,KLOR-CON) 20 MEQ tablet Take 20 mEq by mouth 3 (three) times daily.  . pravastatin (PRAVACHOL) 40 MG tablet Take 40 mg by mouth daily.   . promethazine-codeine (PHENERGAN WITH CODEINE) 6.25-10 MG/5ML syrup Take 5 mLs by mouth every 6 (six) hours as needed for cough.  . traMADol (ULTRAM) 50 MG tablet Take 50 mg by mouth every 8 (eight) hours as needed for pain.  . verapamil (VERELAN PM) 240 MG 24 hr capsule Take 240 mg by mouth daily.  . [DISCONTINUED] amoxicillin (AMOXIL) 500 MG tablet Take 1 tablet (500 mg total) by mouth 2 (two) times daily.  . [DISCONTINUED] amoxicillin-clavulanate (AUGMENTIN) 875-125 MG tablet Take 1 tablet by mouth 2 (two) times daily.  . [DISCONTINUED] Insulin Syringe-Needle U-100 (B-D INSULIN SYRINGE 1CC/25G) 25G X 5/8" 1 ML MISC Use as directed weekly for allergy injections  . [DISCONTINUED] NONFORMULARY OR COMPOUNDED ITEM Allergy Vaccine 1:10 Given at Home   No facility-administered encounter medications on file as of 09/12/2016.     Allergies: Erythromycin base; Lactose intolerance (gi); Lactulose;  Mesalamine; Nitrofuran derivatives; Nitrofurantoin; Other; Sulfa antibiotics; Tdap [diphth-acell pertussis-tetanus]; Erythromycin; Tetanus toxoid, adsorbed; and Tetanus toxoids  Social History   Social History  . Marital status: Married    Spouse name: N/A  . Number of children: 2  . Years of education: N/A   Occupational History  . Not on file.   Social History Main Topics  . Smoking status: Never Smoker  . Smokeless tobacco: Never Used     Comment: positive passive tobacco smoke exposure  . Alcohol use Yes     Comment: glass of wine each night   . Drug use: No  . Sexual activity: Yes   Other Topics Concern  . Not on file   Social History Narrative   Exercise - at least 2 times weekly   Caffeine - 2 cups in the morning          Family History  Problem Relation Age of Onset  . Heart disease Mother   . Arthritis Mother   . Heart attack Father   . Diabetes Other     sibling  . Heart attack Other     sibling    Review of Systems: A 12-system review of systems was performed and was negative except as noted in the HPI.   --------------------------------------------------------------------------------------------------  Physical Exam: BP 126/74 (BP Location: Right Arm)   Pulse (!) 101   Ht 5' 1" (1.549 m)   Wt 173 lb 6.4 oz (78.7 kg)   BMI 32.76 kg/m   General:  Obese woman seated on the exam table.  She is accompanied by her husband. HEENT: No conjunctival pallor or scleral icterus.  Moist mucous membranes.  OP clear. Neck: Supple without lymphadenopathy, thyromegaly, JVD, or HJR. Lungs: Normal work of breathing.  Mildly diminished breath sounds throughout without wheezes or crackles. Heart: Regular rate and rhythm without murmurs, rubs, or gallops.  Non-displaced PMI. Abd: Bowel sounds present.  Soft, NT/ND without hepatosplenomegaly Ext: Trace pretibial edema bilaterally. Radial, PT, and DP pulses are 2+ bilaterally. Skin: warm and dry without rash  EKG:  Normal sinus rhythm with left axis deviation and poor R-wave progression. Left axis deviation and poor R-wave progression are new since 04/22/16 (I have personally reviewed both tracings).  Lab Results  Component Value Date   WBC 11.5 (H) 01/21/2016   HGB 14.3 01/21/2016   HCT 43.1 01/21/2016   MCV 90.4 01/21/2016   PLT 488.0 (H) 01/21/2016    Lab Results  Component Value Date   NA 143 05/30/2016   K 4.0 05/30/2016   CL 106 05/30/2016   CO2 29 05/30/2016   BUN 19 05/30/2016   CREATININE 0.91 05/30/2016   GLUCOSE 72 05/30/2016   ALT 16 08/01/2013   --------------------------------------------------------------------------------------------------  ASSESSMENT AND PLAN: Shortness of breath This has improved with continued antibiotic therapy and regular nebulizer treatments. Patient continues to have considerable exertional dyspnea just walking across the room. Cardiac evaluation up to this point has been largely unrevealing. Recent sleep study demonstrated moderate obstructive sleep apnea. Given her marked exertional dyspnea and EKG changes seen today (poor R-wave progression with left axis deviation, new), we have agreed to proceed with left and right heart catheterization to exclude pulmonary hypertension, restrictive/constrictive physiology, and CAD that was not evident on her previous myocardial perfusion stress test. I have reviewed the risks, indications, and alternatives to cardiac catheterization, possible angioplasty, and stenting with the patient. Risks include but are not limited to bleeding, infection, vascular injury, stroke, myocardial infection, arrhythmia, kidney injury, radiation-related injury in the case of prolonged fluoroscopy use, emergency cardiac surgery, and death. The patient understands the risks of serious complication is 1-2 in 5001 with diagnostic cardiac cath and 1-2% or less with angioplasty/stenting. We will obtain pre-catheterization labs (CBC, BMP, and  INR).  Leg edema Trace pretibial edema noted today. We will continue with her current furosemide dose.  Obstructive sleep apnea Moderate OSA noted on recent home sleep study ordered by Dr. Annamaria Boots. Patient should follow-up with Dr. Annamaria Boots as scheduled to discuss these results further and arrange for CPAP titration.  Follow-up: Return to clinic as needed based on results of cardiac catheterization.  Nelva Bush, MD 09/12/2016 11:49 AM

## 2016-09-19 ENCOUNTER — Telehealth: Payer: Self-pay | Admitting: *Deleted

## 2016-09-19 ENCOUNTER — Encounter (HOSPITAL_COMMUNITY): Payer: Self-pay | Admitting: Internal Medicine

## 2016-09-19 DIAGNOSIS — M0609 Rheumatoid arthritis without rheumatoid factor, multiple sites: Secondary | ICD-10-CM | POA: Diagnosis not present

## 2016-09-19 DIAGNOSIS — M25442 Effusion, left hand: Secondary | ICD-10-CM | POA: Diagnosis not present

## 2016-09-19 DIAGNOSIS — M25441 Effusion, right hand: Secondary | ICD-10-CM | POA: Diagnosis not present

## 2016-09-19 DIAGNOSIS — R0602 Shortness of breath: Secondary | ICD-10-CM

## 2016-09-19 DIAGNOSIS — J069 Acute upper respiratory infection, unspecified: Secondary | ICD-10-CM | POA: Diagnosis not present

## 2016-09-19 DIAGNOSIS — Z79899 Other long term (current) drug therapy: Secondary | ICD-10-CM | POA: Diagnosis not present

## 2016-09-19 MED FILL — Heparin Sodium (Porcine) Inj 1000 Unit/ML: INTRAMUSCULAR | Qty: 10 | Status: AC

## 2016-09-19 NOTE — Telephone Encounter (Signed)
-----  Message from Nelva Bush, MD sent at 09/16/2016  7:14 PM EST ----- Regarding: BMP Hi Webb Silversmith,  Can you have Ms. Mchan come in at the of the week (3/8 or 3/9) for a BMP to evaluate her renal function and potassium, as we increased her furosemide and KCl after cath today? Thanks.  Gerald Stabs

## 2016-09-19 NOTE — Telephone Encounter (Signed)
I spoke with patient and she is coming 09/23/16 for BMET.

## 2016-09-21 ENCOUNTER — Ambulatory Visit (INDEPENDENT_AMBULATORY_CARE_PROVIDER_SITE_OTHER): Payer: Medicare Other | Admitting: Internal Medicine

## 2016-09-21 ENCOUNTER — Encounter: Payer: Self-pay | Admitting: Internal Medicine

## 2016-09-21 VITALS — BP 126/78 | HR 103 | Ht 61.0 in | Wt 170.2 lb

## 2016-09-21 DIAGNOSIS — J328 Other chronic sinusitis: Secondary | ICD-10-CM | POA: Diagnosis not present

## 2016-09-21 DIAGNOSIS — J441 Chronic obstructive pulmonary disease with (acute) exacerbation: Secondary | ICD-10-CM

## 2016-09-21 DIAGNOSIS — G4733 Obstructive sleep apnea (adult) (pediatric): Secondary | ICD-10-CM | POA: Diagnosis not present

## 2016-09-21 DIAGNOSIS — R0902 Hypoxemia: Secondary | ICD-10-CM

## 2016-09-21 MED ORDER — FLUTICASONE-UMECLIDIN-VILANT 100-62.5-25 MCG/INH IN AEPB: 1.0000 | INHALATION_SPRAY | Freq: Every day | RESPIRATORY_TRACT | 0 refills | Status: DC

## 2016-09-21 NOTE — Assessment & Plan Note (Signed)
Persistent postnasal drip sensation but any drainage is obscured by long palate. Discussed symptom overlap between postnasal drip and reflux into throat. We would like to defer more antibiotics pending other assessment. She can use nasal steroid spray, antihistamines.

## 2016-09-21 NOTE — Assessment & Plan Note (Addendum)
Moderate obstructive sleep apnea. Not clear if correcting this will also stabilize oxygenation during sleep. We discussed treatment options, especially CPAP. She agrees to unattended CPAP titration study. Plan-schedule CPAP titration with attention to oxygen saturation

## 2016-09-21 NOTE — Assessment & Plan Note (Signed)
Chronic asthmatic bronchitis symptoms, somewhat worse since influenza in January. I don't think she has an active significant bacterial infection now. Plan-sample trial using Trelegy

## 2016-09-21 NOTE — Assessment & Plan Note (Signed)
Head needed home oxygen for a while in the past. Resting room air saturation at this visit 92%. Plan-we will assess oxygen saturation during her CPAP titration study to see if she needs supplemental oxygen as well as CPAP.

## 2016-09-21 NOTE — Patient Instructions (Signed)
Sample x 2 Trelegy Inhaler   Inhale 1 puff, once daily maintenance  Ok to continue your nebulizer/ rescue inhaler as needed  Order- schedule CPAP titration sleep study   Dx OSA

## 2016-09-21 NOTE — Progress Notes (Signed)
Subjective:    [name], female    DOB: 07/15/1946, 71 y.o.   MRN: [id number]  HPI F never smoker followed for allergic rhinitis, rhinosinusitis, asthmatic bronchitis, ILD, complicated by , GERD, rheumatoid arthritis, ASCVD PFT 09/03/2015-mild obstructive airways disease, minimal restriction, mild diffusion defect. FVC 2.17/83%, FEV1 1.80/92%, ratio 0.83. No response to bronchodilator. TLC 78%, DLCO 73%.  CT chest High Resolution 03/14/2016 1. Mild basilar predominant subpleural reticulation and traction bronchiolectasis, grossly stable, possibly due to nonspecific interstitial pneumonitis. Usual interstitial pneumonitis is not Excluded. Nuclear stress test 05/20/2016-no evidence of significant blockage Echocardiogram 05/11/2016- WNL EF 55-60% Respiratory Viral Panel 08/01/16- POSITIVE Influenza A H3 Unattended Home Sleep Test 08/12/2016-AHI 18.2/hour, desaturation to 75% with average 88%, body weight 180 pounds --------------------------------------------------------  07/28/2016-71 year old female never smoker followed for allergic rhinitis, rhinosinusitis, asthmatic bronchitis, complicated by GERD/Crohn's, Rheumatoid arthritis/prednisone FOLLOWS FOR: Pt has been having increased SOB more than usual; recently been treated by PCP with Levaquin and then Cipro for infection. Cough, chest congestion; had fevers.  Pt states her Cardiologist states she need to see about a sleep study to be done and also if her lungs or heart is causing her SOB. Nuclear stress test 05/20/2016-no evidence of significant blockage Echocardiogram 05/11/2016- WNL EF 55-60% Acute sinusitis then bronchitis with fever and sweats. Still cough productive green after Z-Pak then Levaquin then Cipro. Rheumatologist stopped prednisone 6 weeks ago. Gives on allergy vaccine and wants to continue. We discussed low sugar of our program and option to go to a different office. Doesn't think Memory Dance helps much. Complains  more of shortness of breath with exertion, minor wheeze and cough. Activity limited by left ankle pain. Surgeon told her she might lose her foot if she had surgery. Husband brings recording of her breathing while asleep which he describes more as "shallow" than as noisy snoring.  09/21/2016-71 year old female never smoker followed for allergic rhinitis, rhinosinusitis, asthmatic bronchitis, OSA,complicated by GERD/Crohn's, Rheumatoid arthritis/prednisone FOLLOWS FOR: Pt states she continues to use neb machine TID-feels like it helping slightly but not getting better. Describes persistent cough with sputum intermittently green, no fever, bothersome feeling of phlegm in throat. She was told at cardiac cath that she desaturated lying down Requests handicap parking. Significant shortness of breath trying to walk in here from parking lot. Respiratory viral culture 08/01/2016-positive for influenza A Unattended Home Sleep Test 08/12/2016- AHI 18.2/hour, desaturation to 75% with average 88%, body weight 180 pounds CXR 07/28/2016 IMPRESSION: No pulmonary edema. Mild hyperinflation and chronic interstitial prominence again noted. Mild perihilar bronchitic changes. Streaky left base retrocardiac atelectasis or early infiltrate. No pulmonary edema  ROS-see HPI Constitutional:     weight loss, night sweats, fevers, chills, fatigue, lassitude. HEENT:   No-  headaches, difficulty swallowing, tooth/dental problems, sore throat,       No-  sneezing, itching, ear ache,  nasal congestion, +post nasal drip,  CV:  No-   chest pain, orthopnea, PND, swelling in lower extremities, anasarca,                                                      dizziness, palpitations Resp: +  shortness of breath with exertion or at rest.               + productive cough, non-productive cough,  No- coughing up of  blood.             + change in color of mucus.  +- wheezing.   Skin: No-   rash or lesions. GI:  No-   heartburn,  indigestion, abdominal pain, nausea, vomiting,  GU:  MS: +  joint pain or swelling.   Neuro-     nothing unusual Psych:  No- change in mood or affect. No depression or anxiety.  No memory loss.  Objective:  OBJ- Physical Exam  General- Alert, Oriented, Affect-appropriate, Distress- none acute, + Obese Skin- + stasis changes  lower legs Lymphadenopathy- none Head- atraumatic            Eyes- Gross vision intact, PERRLA, conjunctivae and secretions clear            Ears- Hearing, canals-normal            Nose- Clear, no-Septal dev, mucus, polyps, erosion, perforation             Throat- Mallampati II , mucosa-clear, drainage- none, tonsils-                                               atrophic, voice normal,  Neck- flexible , trachea midline, no stridor , thyroid nl, carotid no bruit Chest - symmetrical excursion , unlabored           Heart/CV- RRR , no murmur , no gallop  , no rub, nl s1 s2                           - JVD- none , edema- none, stasis changes+, varices- none           Lung-   rattling cough +, unlabored, end- expiratory wheeze +           Chest wall-  Abd-  Br/ Gen/ Rectal- Not done, not indicated Extrem- +scar from treated wound,  Neuro- grossly intact to observation

## 2016-09-23 ENCOUNTER — Other Ambulatory Visit: Payer: Medicare Other | Admitting: *Deleted

## 2016-09-23 DIAGNOSIS — M0609 Rheumatoid arthritis without rheumatoid factor, multiple sites: Secondary | ICD-10-CM | POA: Diagnosis not present

## 2016-09-23 DIAGNOSIS — R05 Cough: Secondary | ICD-10-CM | POA: Diagnosis not present

## 2016-09-23 DIAGNOSIS — R0602 Shortness of breath: Secondary | ICD-10-CM

## 2016-09-23 DIAGNOSIS — R059 Cough, unspecified: Secondary | ICD-10-CM

## 2016-09-23 DIAGNOSIS — Z79890 Hormone replacement therapy (postmenopausal): Secondary | ICD-10-CM | POA: Diagnosis not present

## 2016-09-24 LAB — CBC WITH DIFFERENTIAL/PLATELET
Basophils Absolute: 0 10*3/uL (ref 0.0–0.2)
Basos: 0 %
EOS (ABSOLUTE): 0 10*3/uL (ref 0.0–0.4)
Eos: 0 %
Hematocrit: 44.7 % (ref 34.0–46.6)
Hemoglobin: 14.4 g/dL (ref 11.1–15.9)
Immature Grans (Abs): 0 10*3/uL (ref 0.0–0.1)
Immature Granulocytes: 0 %
Lymphocytes Absolute: 1.2 10*3/uL (ref 0.7–3.1)
Lymphs: 12 %
MCH: 28.3 pg (ref 26.6–33.0)
MCHC: 32.2 g/dL (ref 31.5–35.7)
MCV: 88 fL (ref 79–97)
Monocytes Absolute: 0.8 10*3/uL (ref 0.1–0.9)
Monocytes: 9 %
Neutrophils Absolute: 7.2 10*3/uL — ABNORMAL HIGH (ref 1.4–7.0)
Neutrophils: 79 %
Platelets: 342 10*3/uL (ref 150–379)
RBC: 5.08 x10E6/uL (ref 3.77–5.28)
RDW: 15.4 % (ref 12.3–15.4)
WBC: 9.2 10*3/uL (ref 3.4–10.8)

## 2016-09-24 LAB — BASIC METABOLIC PANEL
BUN/Creatinine Ratio: 11 — ABNORMAL LOW (ref 12–28)
BUN: 10 mg/dL (ref 8–27)
CO2: 22 mmol/L (ref 18–29)
Calcium: 10.2 mg/dL (ref 8.7–10.3)
Chloride: 102 mmol/L (ref 96–106)
Creatinine, Ser: 0.87 mg/dL (ref 0.57–1.00)
GFR calc Af Amer: 77 mL/min/{1.73_m2} (ref >59)
GFR calc non Af Amer: 67 mL/min/{1.73_m2} (ref >59)
Glucose: 87 mg/dL (ref 65–99)
Potassium: 4.1 mmol/L (ref 3.5–5.2)
Sodium: 140 mmol/L (ref 134–144)

## 2016-09-24 LAB — HEPATIC FUNCTION PANEL
ALT: 17 IU/L (ref 0–32)
AST: 25 IU/L (ref 0–40)
Albumin: 4.1 g/dL (ref 3.5–4.8)
Alkaline Phosphatase: 86 IU/L (ref 39–117)
Bilirubin Total: 0.3 mg/dL (ref 0.0–1.2)
Bilirubin, Direct: 0.14 mg/dL (ref 0.00–0.40)
Total Protein: 7.2 g/dL (ref 6.0–8.5)

## 2016-09-29 ENCOUNTER — Other Ambulatory Visit: Payer: Self-pay | Admitting: Internal Medicine

## 2016-10-06 ENCOUNTER — Telehealth: Payer: Self-pay | Admitting: Internal Medicine

## 2016-10-06 LAB — FUNGUS CULTURE W SMEAR

## 2016-10-06 MED ORDER — FLUTICASONE-UMECLIDIN-VILANT 100-62.5-25 MCG/INH IN AEPB: 1.0000 | INHALATION_SPRAY | Freq: Every day | RESPIRATORY_TRACT | 11 refills | Status: DC

## 2016-10-06 NOTE — Telephone Encounter (Signed)
Rx was sent to pharm and spoke with the pt and notified that this was done

## 2016-10-12 ENCOUNTER — Telehealth: Payer: Self-pay | Admitting: Internal Medicine

## 2016-10-12 NOTE — Telephone Encounter (Signed)
Pt's insurance does not cover trelegy- needs a PA done for this medication.  Pt uses [facility] on [address].    Called pharmacy, PA form is being refaxed to office.  Will await PA form.

## 2016-10-17 ENCOUNTER — Encounter: Payer: Self-pay | Admitting: *Deleted

## 2016-10-17 NOTE — Telephone Encounter (Signed)
Received PA form Called [phone]  Was placed on hold for approx 5 min WCB Pt ID number [phone]

## 2016-10-17 NOTE — Telephone Encounter (Signed)
Called pharmacy, and asked to refax PA request as we have not received request. Will hold message in triage until request is received.

## 2016-10-18 ENCOUNTER — Telehealth: Payer: Self-pay | Admitting: Internal Medicine

## 2016-10-18 MED ORDER — FLUTICASONE-UMECLIDIN-VILANT 100-62.5-25 MCG/INH IN AEPB: 1.0000 | INHALATION_SPRAY | Freq: Every day | RESPIRATORY_TRACT | 0 refills | Status: AC

## 2016-10-18 MED ORDER — AZITHROMYCIN 250 MG PO TABS: ORAL_TABLET | ORAL | 0 refills | Status: DC

## 2016-10-18 NOTE — Telephone Encounter (Signed)
Patient called stating will be out of medication this evening, requesting samples. Patient contact [phone].[name]

## 2016-10-18 NOTE — Telephone Encounter (Signed)
Spoke with pt, aware of recs.  zpak sent to preferred pharmacy.  Nothing further needed.

## 2016-10-18 NOTE — Telephone Encounter (Signed)
Pt c/o sinus congestion, PND, prod cough with green mucus X2-3 days.  Denies fever, chest pain.  Pt has been taking mucinex DM to help with symptoms.  Requesting further rec.     Also, pt notes that Trelegy is not covered by insurance and is needing a PA.  [facility] [address].  Pt has 1 day left of her samples and is requesting either a sample or to be switched to a covered alternative- pt specifically mentioned Advair.   Last ov: 09/21/16 Next ov:  11/21/16  CY please advise.  Thanks!

## 2016-10-18 NOTE — Telephone Encounter (Signed)
Offer Z pak  250 mg, # 6, 2 today then one daily      Ok to over-ride erythromycin ointolerance  Trelegy Prior Authorization has been filed. Awaiting response.

## 2016-10-18 NOTE — Telephone Encounter (Signed)
Call [facility], and started PA. Will await determination. Two samples have been placed up front for pick up. Pt is aware. Will leave message in triage until determination is received.  Ref # [id number]

## 2016-10-20 ENCOUNTER — Other Ambulatory Visit: Payer: Self-pay | Admitting: Internal Medicine

## 2016-10-20 NOTE — Telephone Encounter (Signed)
Will forward to [facility] to await approval/denial

## 2016-10-22 LAB — AFB CULTURE WITH SMEAR (NOT AT ARMC)

## 2016-10-23 NOTE — Progress Notes (Signed)
Follow-up Outpatient Visit Date: 10/24/2016  Chief Complaint: Shortness of breath  HPI:  [name] is a 71 y.o. year-old female with history of asthmatic bronchitis, GERD, rheumatoid arthritis, CVA (small bilateral basal ganglia lacunar infarcts noted by brain MRI in 11/2012), prediabetes with hemoglobin A1c of 6.0 in 2014, irritable bowel syndrome, and allergic rhinitis, who presents for follow-up of shortness of breath. I last saw her on 09/12/16, at which time she continued to have significant exertional dyspnea. We agreed to perform a left and right heart catheterization, which showed mild to moderate non-obstructive CAD, mild to moderate pulmonary hypertension, and mildly elevated right heart pressures. She subsequently saw Dr. Annamaria Boots to discuss recently diagnosed OSA. CPAP titration is currently pending. She was also recently started on Trilogy by Dr. Annamaria Boots and has noted significant improvement in her breathing. She believes that some of her improved breathing is also a result of increased furosemide. Unfortunately, she is currently fighting a respiratory infection. She has not had any chest pain or edema. She also denies palpitations and lightheadedness. She is tolerating rosuvastatin well. She notes that following the left and right heart catheterization last month, she had double vision that lasted for the rest of the day. This has not recurred. She denies other focal neurologic deficits including speech difficulty, facial droop, focal weakness, paresthesia, and amaurosis fugax.  --------------------------------------------------------------------------------------------------  Cardiovascular History & Procedures: Cardiovascular Problems:  Shortness of breath  Non-obstructive coronary artery disease  Stroke  Risk Factors:  History of stroke, borderline diabetes, hyperlipidemia, obesity, and chronic steroid use  Cath/PCI:  L/RHC (09/16/16): LMCA normal. LAD with 50% midvessel stenosis.  LCx normal. 20% proximal RCA. LVEDP 15. RA 13, RV 38/12, PA 43/23 (32), PCWP 16. Ventricular concordance noted. Fick CO/CI 6.4/3.7. PA sat 70%, RA sat 75%, Ao sat 94%. PVR 2.5 Wood Units.  CV Surgery:  None  EP Procedures and Devices:  None  Non-Invasive Evaluation(s):  Transthoracic echocardiogram (05/11/16): Normal LV size and in traction (EF 55-60%). Normal LV wall motion. Grade 1 diastolic. Normal RV size and function. Mild mitral valve thickening. Mild TR.  Pharmacologic myocardial perfusion stress test (05/11/16): Low risk study without ischemia or scar. LVEF 62%.  Bilateral lower extremity venous duplex (04/07/16): No evidence of DVT though left peroneal vein was suboptimally imaged.  Transthoracic echocardiogram (12/17/12): Normal LV size with abnormal septal motion and EF of 50%. Grade 1 diastolic dysfunction noted. Aortic sclerosis without stenosis or regurgitation evident. Trivial MR and TR without other significant valvular disease. Normal PA systolic and central venous pressures.  Bilateral lower extremity arterial duplex (12/05/12): Bilateral ABIs 1.2. No significant stenosis identified.  Recent CV Pertinent Labs: Lab Results  Component Value Date   INR 1.0 09/12/2016   K 4.1 09/23/2016   BUN 10 09/23/2016   CREATININE 0.87 09/23/2016   CREATININE 0.91 05/30/2016    Past medical and surgical history were reviewed and updated in EPIC.   Outpatient Encounter Prescriptions as of 10/24/2016  Medication Sig  . albuterol (PROAIR HFA) 108 (90 Base) MCG/ACT inhaler Inhale 2 puffs into the lungs every 4 (four) hours as needed for wheezing or shortness of breath.  Marland Kitchen albuterol (PROVENTIL) (2.5 MG/3ML) 0.083% nebulizer solution UE 1 VIAL IN NEBULIZER EVERY 6 HOURS AS NEEDED FOR WHEEZING OR SHORTNESS OF BREATH  . aspirin EC 81 MG tablet Take 81 mg by mouth daily.  Marland Kitchen azithromycin (ZITHROMAX) 250 MG tablet Take 2 today, then 1 daily until gone.  . benzonatate (TESSALON) 100 MG  capsule  TAKE 1 CAPSULE (100 MG TOTAL) BY MOUTH 4 (FOUR) TIMES DAILY AS NEEDED FOR COUGH.  . cetirizine (ZYRTEC) 10 MG tablet Take 10 mg by mouth daily.  . clidinium-chlordiazePOXIDE (LIBRAX) 5-2.5 MG capsule Take 1 capsule by mouth 2 (two) times daily as needed (for IBS).  . colestipol (COLESTID) 1 G tablet Take 1 g by mouth 2 (two) times daily.   Marland Kitchen FLUoxetine (PROZAC) 20 MG capsule Take 20 mg by mouth daily.  . fluticasone (FLONASE) 50 MCG/ACT nasal spray Place 2 sprays into the nose daily.   . Fluticasone-Umeclidin-Vilant (TRELEGY ELLIPTA) 100-62.5-25 MCG/INH AEPB Inhale 1 puff into the lungs daily.  . furosemide (LASIX) 40 MG tablet Take 1 tablet (40 mg total) by mouth 2 (two) times daily.  Marland Kitchen glycopyrrolate (ROBINUL) 1 MG tablet Take 1 mg by mouth 3 (three) times daily as needed (for IBS).   . Glycopyrrolate-Formoterol (BEVESPI AEROSPHERE) 9-4.8 MCG/ACT AERO Inhale 2 puffs into the lungs 2 (two) times daily.  . hydroxychloroquine (PLAQUENIL) 200 MG tablet Take 600 mg by mouth daily.   . hydroxypropyl methylcellulose / hypromellose (ISOPTO TEARS / GONIOVISC) 2.5 % ophthalmic solution Place 1-2 drops into both eyes 3 (three) times daily as needed for dry eyes.  Marland Kitchen ipratropium (ATROVENT) 0.03 % nasal spray USE 1 TO 2 SPRAYS INTO BOTH NOSTRILS 2 (TWO) TIMES  DAILY.  Marland Kitchen leflunomide (ARAVA) 20 MG tablet Take 20 mg by mouth daily.   . Melatonin 10 MG TABS Take 10 mg by mouth at bedtime.  . mirtazapine (REMERON) 45 MG tablet Take 45 mg by mouth at bedtime.  . montelukast (SINGULAIR) 10 MG tablet TAKE 1 TABLET BY MOUTH AT  BEDTIME  . Multiple Vitamin (MULTIVITAMIN) tablet Take 1 tablet by mouth daily.  Marland Kitchen omeprazole (PRILOSEC) 40 MG capsule Take 40 mg by mouth daily.  . potassium chloride SA (K-DUR,KLOR-CON) 20 MEQ tablet Take 2 tablets (40 mEq total) by mouth 2 (two) times daily.  . promethazine-codeine (PHENERGAN WITH CODEINE) 6.25-10 MG/5ML syrup Take 5 mLs by mouth every 6 (six) hours as needed for  cough.  . rosuvastatin (CRESTOR) 20 MG tablet Take 1 tablet (20 mg total) by mouth daily.  . traMADol (ULTRAM) 50 MG tablet Take 50 mg by mouth 2 (two) times daily.   . verapamil (CALAN-SR) 240 MG CR tablet Take 240 mg by mouth daily.   No facility-administered encounter medications on file as of 10/24/2016.     Allergies: Erythromycin base; Lactose intolerance (gi); Lactulose; Mesalamine; Nitrofuran derivatives; Nitrofurantoin; Other; Sulfa antibiotics; Tdap [diphth-acell pertussis-tetanus]; and Tetanus toxoid, adsorbed  Social History   Social History  . Marital status: Married    Spouse name: N/A  . Number of children: 2  . Years of education: N/A   Occupational History  . Not on file.   Social History Main Topics  . Smoking status: Never Smoker  . Smokeless tobacco: Never Used     Comment: positive passive tobacco smoke exposure  . Alcohol use Yes     Comment: glass of wine each night   . Drug use: No  . Sexual activity: Yes   Other Topics Concern  . Not on file   Social History Narrative   Exercise - at least 2 times weekly   Caffeine - 2 cups in the morning          Family History  Problem Relation Age of Onset  . Heart disease Mother   . Arthritis Mother   . Heart attack Father   .  Diabetes Other     sibling  . Heart attack Other     sibling    Review of Systems: A 12-system review of systems was performed and was negative except as noted in the HPI.   --------------------------------------------------------------------------------------------------  Physical Exam: BP 110/72   Pulse 75   Ht _0  (1.549 m)   Wt 165 lb 12.8 oz (75.2 kg)   SpO2 92%   BMI 31.33 kg/m   General:  Obese woman seated on the exam table.  She is accompanied by her husband. HEENT: No conjunctival pallor or scleral icterus.  Moist mucous membranes.  OP clear. Neck: Supple without lymphadenopathy, thyromegaly, JVD, or HJR. Lungs: Normal work of breathing.  Bilateral  rhonchi, most pronounced at the bases. No crackles Heart: Regular rate and rhythm without murmurs, rubs, or gallops.  Non-displaced PMI. Abd: Bowel sounds present.  Soft, NT/ND without hepatosplenomegaly Ext: No significant lower extremity edema. Radial, PT, and DP pulses are 2+ bilaterally. Right radial arteriotomy site is well-healed. Skin: warm and dry without rash Neuro: Cranial nerves III-XII intact. 5/5 upper and lower extremity strength bilaterally (with exception of left foot flexion secondary to chronic injury). Normal fine touch sensation throughout.  Lab Results  Component Value Date   WBC 9.2 09/23/2016   HGB 14.3 01/21/2016   HCT 44.7 09/23/2016   MCV 88 09/23/2016   PLT 342 09/23/2016    Lab Results  Component Value Date   NA 140 09/23/2016   K 4.1 09/23/2016   CL 102 09/23/2016   CO2 22 09/23/2016   BUN 10 09/23/2016   CREATININE 0.87 09/23/2016   GLUCOSE 87 09/23/2016   ALT 17 09/23/2016   --------------------------------------------------------------------------------------------------  ASSESSMENT AND PLAN: Shortness of breath With exception of recent setback from respiratory infection, patient notes that her breathing has improved with Trilogy and increase furosemide. She is awaiting final CPAP titration. We will not make any medication changes at this time.  Nonobstructive coronary artery disease Up to 50% stenosis noted in the mid LAD. We will continue with risk factor modification and medical therapy to prevent progression of disease. Patient is tolerating rosuvastatin well.  Diplopia Transient diplopia noted following catheterization that lasted for less than 24 hours. My primary concern would be for TIA, though IV contrast can sometimes cause transient vision changes. She has no focal deficits today. She notes that her vision has returned to baseline. We will continue with aspirin and rosuvastatin, as above. I will also refer the patient for bilateral  carotid Dopplers. We discussed utility of brain MRI but have agreed to defer this, given that it would not change our ongoing management. She was advised to seek immediate medical attention if she has any recurrent vision problems or other neurologic changes.  Pulmonary hypertension Mild to moderate pulmonary hypertension noted on recent cardiac catheterization. Patient appears euvolemic today. Continue treatment of underlying lung disease, per Dr. Annamaria Boots. We will decrease furosemide to 40 mg daily, with an additional dose to be taken as needed for weight gain or swelling. We will also decrease potassium to 40 mEq daily.  Obstructive sleep apnea Moderate OSA noted on recent home sleep study ordered by Dr. Annamaria Boots. CPAP titration is pending.  Follow-up: Return to clinic in 3 months.  Nelva Bush, MD 10/25/2016 1:05 PM

## 2016-10-24 ENCOUNTER — Ambulatory Visit (INDEPENDENT_AMBULATORY_CARE_PROVIDER_SITE_OTHER): Payer: Medicare Other | Admitting: Internal Medicine

## 2016-10-24 ENCOUNTER — Encounter: Payer: Self-pay | Admitting: Internal Medicine

## 2016-10-24 VITALS — BP 110/72 | HR 75 | Ht 61.0 in | Wt 165.8 lb

## 2016-10-24 DIAGNOSIS — H532 Diplopia: Secondary | ICD-10-CM

## 2016-10-24 DIAGNOSIS — I272 Pulmonary hypertension, unspecified: Secondary | ICD-10-CM

## 2016-10-24 DIAGNOSIS — R0602 Shortness of breath: Secondary | ICD-10-CM

## 2016-10-24 DIAGNOSIS — G4733 Obstructive sleep apnea (adult) (pediatric): Secondary | ICD-10-CM

## 2016-10-24 DIAGNOSIS — I2510 Atherosclerotic heart disease of native coronary artery without angina pectoris: Secondary | ICD-10-CM | POA: Diagnosis not present

## 2016-10-24 MED ORDER — POTASSIUM CHLORIDE CRYS ER 20 MEQ PO TBCR: 20.0000 meq | EXTENDED_RELEASE_TABLET | Freq: Two times a day (BID) | ORAL | 1 refills | Status: DC

## 2016-10-24 MED ORDER — FUROSEMIDE 40 MG PO TABS: 40.0000 mg | ORAL_TABLET | Freq: Every day | ORAL | 1 refills | Status: DC

## 2016-10-24 NOTE — Patient Instructions (Signed)
Medication Instructions:  Decrease lasix (furosemide) to 75m daily  Decrease KCL (potassium) to 20 mEq two times a day.  Labwork: None   Testing/Procedures: Your physician has requested that you have a carotid duplex. This test is an ultrasound of the carotid arteries in your neck. It looks at blood flow through these arteries that supply the brain with blood. Allow one hour for this exam. There are no restrictions or special instructions.    Follow-Up: Your physician wants you to follow-up in: 3 months (July 2018).You will receive a reminder letter in the mail two months in advance. If you don't receive a letter, please call our office to schedule the follow-up appointment.   Any Other Special Instructions Will Be Listed Below (If Applicable).  Your physician recommends that you weigh, daily, at the same time every day, and in the same amount of clothing.   If you gain more than 2 pounds in 24 hour or 5 pounds in 1 week, take an extra 40 mg of lasix (furosemide) and 40 mEq of potassium         If you need a refill on your cardiac medications before your next appointment, please call your pharmacy.

## 2016-10-25 ENCOUNTER — Encounter: Payer: Self-pay | Admitting: Internal Medicine

## 2016-10-25 DIAGNOSIS — I2510 Atherosclerotic heart disease of native coronary artery without angina pectoris: Secondary | ICD-10-CM | POA: Insufficient documentation

## 2016-10-25 DIAGNOSIS — I272 Pulmonary hypertension, unspecified: Secondary | ICD-10-CM | POA: Insufficient documentation

## 2016-10-26 ENCOUNTER — Ambulatory Visit (HOSPITAL_COMMUNITY)
Admission: RE | Admit: 2016-10-26 | Discharge: 2016-10-26 | Disposition: A | Discharge status: Home / Self Care | Payer: Medicare Other | Source: Ambulatory Visit | Attending: Cardiology | Admitting: Cardiology

## 2016-10-26 ENCOUNTER — Telehealth: Payer: Self-pay | Admitting: Internal Medicine

## 2016-10-26 DIAGNOSIS — H532 Diplopia: Secondary | ICD-10-CM

## 2016-10-26 DIAGNOSIS — R0602 Shortness of breath: Secondary | ICD-10-CM | POA: Diagnosis present

## 2016-10-26 DIAGNOSIS — I6523 Occlusion and stenosis of bilateral carotid arteries: Secondary | ICD-10-CM | POA: Diagnosis not present

## 2016-10-26 MED ORDER — PROMETHAZINE-CODEINE 6.25-10 MG/5ML PO SYRP: 5.0000 mL | ORAL_SOLUTION | Freq: Three times a day (TID) | ORAL | 0 refills | Status: DC | PRN

## 2016-10-26 MED ORDER — AMOXICILLIN 500 MG PO CAPS: 500.0000 mg | ORAL_CAPSULE | Freq: Three times a day (TID) | ORAL | 0 refills | Status: DC

## 2016-10-26 NOTE — Telephone Encounter (Signed)
Spoke with pt, aware of recs.  Rx sent to preferred pharmacy. Nothing further needed.

## 2016-10-26 NOTE — Telephone Encounter (Signed)
Offer amoxacillin 500 mg, # 21    1 three times daily

## 2016-10-26 NOTE — Telephone Encounter (Signed)
CY  Please Advise-  Informed pt of your previous message, and she wanted to know were you going to call in a antibiotic as well.

## 2016-10-26 NOTE — Telephone Encounter (Signed)
Ok prometh codeine 200 ml, 5 ml every 8 hours if needed for cough

## 2016-10-26 NOTE — Telephone Encounter (Signed)
No updates for approval or denial has come to myself or CY.

## 2016-10-26 NOTE — Telephone Encounter (Signed)
Spoke with pt, who states she finished zpak on 10-23-16 with mild relief that CY prescribed on 10-18-16 . Pt reports of prod cough with yellow mucus, chest congestion & wheezing x10d Denies any fever, chills or sweats. Pt taken mucinex DM and robitussin. Pt is requesting a Rx for phenergan with codeine, as this medication typically knocks this cough out. Pt is scheduled for sleep study on 11-09-16 and wished to be well by then.  CY please advise. Thanks.  Current Outpatient Prescriptions on File Prior to Visit  Medication Sig Dispense Refill  . albuterol (PROAIR HFA) 108 (90 Base) MCG/ACT inhaler Inhale 2 puffs into the lungs every 4 (four) hours as needed for wheezing or shortness of breath. 1 Inhaler 3  . albuterol (PROVENTIL) (2.5 MG/3ML) 0.083% nebulizer solution UE 1 VIAL IN NEBULIZER EVERY 6 HOURS AS NEEDED FOR WHEEZING OR SHORTNESS OF BREATH 75 mL 5  . aspirin EC 81 MG tablet Take 81 mg by mouth daily.    Marland Kitchen azithromycin (ZITHROMAX) 250 MG tablet Take 2 today, then 1 daily until gone. 6 tablet 0  . benzonatate (TESSALON) 100 MG capsule TAKE 1 CAPSULE (100 MG TOTAL) BY MOUTH 4 (FOUR) TIMES DAILY AS NEEDED FOR COUGH. 50 capsule 5  . cetirizine (ZYRTEC) 10 MG tablet Take 10 mg by mouth daily.    . clidinium-chlordiazePOXIDE (LIBRAX) 5-2.5 MG capsule Take 1 capsule by mouth 2 (two) times daily as needed (for IBS).    . colestipol (COLESTID) 1 G tablet Take 1 g by mouth 2 (two) times daily.     Marland Kitchen FLUoxetine (PROZAC) 20 MG capsule Take 20 mg by mouth daily.    . fluticasone (FLONASE) 50 MCG/ACT nasal spray Place 2 sprays into the nose daily.     . Fluticasone-Umeclidin-Vilant (TRELEGY ELLIPTA) 100-62.5-25 MCG/INH AEPB Inhale 1 puff into the lungs daily. 30 each 11  . furosemide (LASIX) 40 MG tablet Take 1 tablet (40 mg total) by mouth daily. 90 tablet 1  . glycopyrrolate (ROBINUL) 1 MG tablet Take 1 mg by mouth 3 (three) times daily as needed (for IBS).     . Glycopyrrolate-Formoterol (BEVESPI  AEROSPHERE) 9-4.8 MCG/ACT AERO Inhale 2 puffs into the lungs 2 (two) times daily. 2 Inhaler 0  . hydroxychloroquine (PLAQUENIL) 200 MG tablet Take 600 mg by mouth daily.     . hydroxypropyl methylcellulose / hypromellose (ISOPTO TEARS / GONIOVISC) 2.5 % ophthalmic solution Place 1-2 drops into both eyes 3 (three) times daily as needed for dry eyes.    Marland Kitchen ipratropium (ATROVENT) 0.03 % nasal spray USE 1 TO 2 SPRAYS INTO BOTH NOSTRILS 2 (TWO) TIMES  DAILY. 90 mL 3  . leflunomide (ARAVA) 20 MG tablet Take 20 mg by mouth daily.     . Melatonin 10 MG TABS Take 10 mg by mouth at bedtime.    . mirtazapine (REMERON) 45 MG tablet Take 45 mg by mouth at bedtime.    . montelukast (SINGULAIR) 10 MG tablet TAKE 1 TABLET BY MOUTH AT  BEDTIME 90 tablet 1  . Multiple Vitamin (MULTIVITAMIN) tablet Take 1 tablet by mouth daily.    Marland Kitchen omeprazole (PRILOSEC) 40 MG capsule Take 40 mg by mouth daily.    . potassium chloride SA (KLOR-CON M20) 20 MEQ tablet Take 1 tablet (20 mEq total) by mouth 2 (two) times daily. 180 tablet 1  . promethazine-codeine (PHENERGAN WITH CODEINE) 6.25-10 MG/5ML syrup Take 5 mLs by mouth every 6 (six) hours as needed for cough. 200 mL 0  .  rosuvastatin (CRESTOR) 20 MG tablet Take 1 tablet (20 mg total) by mouth daily. 30 tablet 5  . traMADol (ULTRAM) 50 MG tablet Take 50 mg by mouth 2 (two) times daily.     . verapamil (CALAN-SR) 240 MG CR tablet Take 240 mg by mouth daily.     No current facility-administered medications on file prior to visit.    Allergies  Allergen Reactions  . Erythromycin Base Diarrhea    Other  . Lactose Intolerance (Gi) Diarrhea  . Lactulose Diarrhea  . Mesalamine Nausea Only  . Nitrofuran Derivatives     shakiness  . Nitrofurantoin Other (See Comments)    shakiness other  . Other Diarrhea and Other (See Comments)    Shaking uncontrollably "lettuce only" "lettuce only"  . Sulfa Antibiotics Other (See Comments)    Patient can't recall reaction   . Tdap  [Diphth-Acell Pertussis-Tetanus]     Shaking uncontrollably  . Tetanus Toxoid, Adsorbed Other (See Comments)    Shaking uncontrollable

## 2016-10-26 NOTE — Telephone Encounter (Signed)
[name]---any updated on the PA for the trelegy?  thanks

## 2016-10-27 ENCOUNTER — Telehealth (HOSPITAL_COMMUNITY): Payer: Self-pay | Admitting: Internal Medicine

## 2016-10-27 NOTE — Telephone Encounter (Signed)
COPY OF PATIENT'S CAROTID FAXED TO DR. Claris Gower _[phone] . CONFIRMATION RECEIVED

## 2016-11-02 DIAGNOSIS — E041 Nontoxic single thyroid nodule: Secondary | ICD-10-CM | POA: Diagnosis not present

## 2016-11-02 DIAGNOSIS — E0590 Thyrotoxicosis, unspecified without thyrotoxic crisis or storm: Secondary | ICD-10-CM | POA: Diagnosis not present

## 2016-11-03 MED ORDER — UMECLIDINIUM-VILANTEROL 62.5-25 MCG/INH IN AEPB: 1.0000 | INHALATION_SPRAY | Freq: Every day | RESPIRATORY_TRACT | 1 refills | Status: DC

## 2016-11-03 NOTE — Telephone Encounter (Signed)
Explain coverage limitation by her insurance;  Instead of Trelegy (we gave sample and prior auth denied) She needs to try Anoro Ellipta inhaler-    Inhale 1 puff, once daily

## 2016-11-03 NOTE — Telephone Encounter (Signed)
atc [facility] to check status of PA, PA was denied d/t additional information not being provided by our office- pt must try and fail 2 of the following: anoro, incruse, serevent discus, and sprivia hh or respimat.    CY please advise.  Thanks.

## 2016-11-03 NOTE — Telephone Encounter (Signed)
Called spoke with patient and discussed the findings and recommendations as stated by CDY.  Pt okay with this and voiced her understanding.  She is aware that the DEVICE for the Anoro works exactly the same as the Trelegy and felt comfortable with switching medications without coming to the office for a demonstration.  Pt does have approx 13 days left of her Trelegy sample and will complete this while waiting for the Anoro Rx to arrive from [facility] (prescription sent now).  Pt is aware to contact the office if she feels the Anoro is not adequately controlling her symptoms.  Coupon for Anoro placed in the mail to go to pt's verified home address.  Med list updated Rx sent for Anoro to [facility] Coupon placed in the mail Nothing further needed; will sign off

## 2016-11-08 ENCOUNTER — Other Ambulatory Visit (HOSPITAL_COMMUNITY): Payer: Self-pay | Admitting: Family Medicine

## 2016-11-08 DIAGNOSIS — R946 Abnormal results of thyroid function studies: Secondary | ICD-10-CM

## 2016-11-09 ENCOUNTER — Ambulatory Visit (HOSPITAL_BASED_OUTPATIENT_CLINIC_OR_DEPARTMENT_OTHER): Payer: Medicare Other | Attending: Internal Medicine | Admitting: Internal Medicine

## 2016-11-09 DIAGNOSIS — G4733 Obstructive sleep apnea (adult) (pediatric): Secondary | ICD-10-CM | POA: Insufficient documentation

## 2016-11-13 DIAGNOSIS — G4733 Obstructive sleep apnea (adult) (pediatric): Secondary | ICD-10-CM

## 2016-11-13 NOTE — Procedures (Signed)
Patient Name: [name], [name] Date: 11/09/2016 Gender: Female D.O.B: 12/22/1945 Age (years): 61 Referring Provider: [name] MD, ABSM Height (inches): 61 Interpreting Physician: [name] MD, ABSM Weight (lbs): 162 RPSGT: [name] BMI: 31 MRN: [id number] Neck Size: 16.00 CLINICAL INFORMATION The patient is referred for a CPAP titration to treat sleep apnea.  Date of NPSG, Split Night or HST:  unattended HST 08/12/16  AHI 18.2/ hr, desaturation to 75%, body weight 180.1 lbs  SLEEP STUDY TECHNIQUE As per the AASM Manual for the Scoring of Sleep and Associated Events v2.3 (April 2016) with a hypopnea requiring 4% desaturations.  The channels recorded and monitored were frontal, central and occipital EEG, electrooculogram (EOG), submentalis EMG (chin), nasal and oral airflow, thoracic and abdominal wall motion, anterior tibialis EMG, snore microphone, electrocardiogram, and pulse oximetry. Continuous positive airway pressure (CPAP) was initiated at the beginning of the study and titrated to treat sleep-disordered breathing.  MEDICATIONS Medications self-administered by patient taken the night of the study : ROBINUL, ATROVENT, TRELEGY, ZYRTEC, MELATONIN, REMERON, SINGULAIR, PRILOSEC, K-DUR, KLOR-CON, CRESTOR, TRAMADOL  TECHNICIAN COMMENTS Comments added by technician: NO RESTROOM VISTED. O2 initiated due to low sats.  Comments added by scorer: N/A RESPIRATORY PARAMETERS Optimal PAP Pressure (cm): 6 AHI at Optimal Pressure (/hr): 0.8 Overall Minimal O2 (%): 86.00 Supine % at Optimal Pressure (%): 100 Minimal O2 at Optimal Pressure (%): 87.0    SLEEP ARCHITECTURE The study was initiated at 10:50:25 PM and ended at 4:43:13 AM.  Sleep onset time was 28.8 minutes and the sleep efficiency was 86.4%. The total sleep time was 305.0 minutes.  The patient spent 3.61% of the night in stage N1 sleep, 76.72% in stage N2 sleep, 0.16% in stage N3 and 19.51% in REM.Stage REM  latency was 200.5 minutes  Wake after sleep onset was 19.0. Alpha intrusion was absent. Supine sleep was 83.00%.  CARDIAC DATA The 2 lead EKG demonstrated sinus rhythm. The mean heart rate was 82.85 beats per minute. Other EKG findings include: None.  LEG MOVEMENT DATA The total Periodic Limb Movements of Sleep (PLMS) were 97. The PLMS index was 19.08. A PLMS index of <15 is considered normal in adults.  IMPRESSIONS - The optimal PAP pressure was 6 cm of water. - Central sleep apnea was not noted during this titration (CAI = 0.2/h). - Moderate oxygen desaturations were observed during this titration (min O2 = 86.00%, 17.5 minutes with sat < = 88%, Mean  91.4%). - Supplemental O2 1L/min added at 11:44 PM per protocol for sustained low saturation. - No snoring was audible during this study. - No cardiac abnormalities were observed during this study. - Mild periodic limb movements were observed during this study. Arousals associated with PLMs were rare.  DIAGNOSIS - Obstructive Sleep Apnea (327.23 [G47.33 ICD-10]) - Nocturnal Hypoxemia (327.26 [G47.36 ICD-10])  RECOMMENDATIONS - Trial of CPAP therapy on 6 cm H2O with a Small size Fisher&Paykel Nasal Mask Eson mask and heated humidification. - Patient qualified for supplemental O2 at 1L/min by recording 17.5 minutes on attended CPAP with saturation <= 88% on room air. - Avoid alcohol, sedatives and other CNS depressants that may worsen sleep apnea and disrupt normal sleep architecture. - Sleep hygiene should be reviewed to assess factors that may improve sleep quality. - Weight management and regular exercise should be initiated or continued.  [Electronically signed] 11/13/2016 11:47 AM  [name] MD, ABSM Diplomate, American Board of Sleep Medicine   NPI: [id number]  [place], American Board of  Sleep Medicine  ELECTRONICALLY SIGNED ON:  11/13/2016, 11:40 AM [place] PH: [phone]  [phone]   FX: [phone] [place]

## 2016-11-18 DIAGNOSIS — M19071 Primary osteoarthritis, right ankle and foot: Secondary | ICD-10-CM | POA: Diagnosis not present

## 2016-11-18 DIAGNOSIS — S92592A Other fracture of left lesser toe(s), initial encounter for closed fracture: Secondary | ICD-10-CM | POA: Diagnosis not present

## 2016-11-18 DIAGNOSIS — M19072 Primary osteoarthritis, left ankle and foot: Secondary | ICD-10-CM | POA: Diagnosis not present

## 2016-11-21 ENCOUNTER — Ambulatory Visit (INDEPENDENT_AMBULATORY_CARE_PROVIDER_SITE_OTHER): Payer: Medicare Other | Admitting: Internal Medicine

## 2016-11-21 ENCOUNTER — Encounter: Payer: Self-pay | Admitting: Internal Medicine

## 2016-11-21 VITALS — BP 118/62 | HR 73 | Resp 16 | Ht 61.0 in | Wt 170.8 lb

## 2016-11-21 DIAGNOSIS — I2510 Atherosclerotic heart disease of native coronary artery without angina pectoris: Secondary | ICD-10-CM | POA: Diagnosis not present

## 2016-11-21 DIAGNOSIS — J4540 Moderate persistent asthma, uncomplicated: Secondary | ICD-10-CM | POA: Diagnosis not present

## 2016-11-21 DIAGNOSIS — J849 Interstitial pulmonary disease, unspecified: Secondary | ICD-10-CM

## 2016-11-21 DIAGNOSIS — G4733 Obstructive sleep apnea (adult) (pediatric): Secondary | ICD-10-CM

## 2016-11-21 NOTE — Patient Instructions (Signed)
Order- new DME, new CPAP auto 5-15, mask of choice, humidifier, supplies, AirView               After CPAP is started, please do ONOX on room air with CPAP    Dx Asthmatic bronchitis, moderate

## 2016-11-21 NOTE — Progress Notes (Signed)
Subjective:    [name], female    DOB: 1945-11-23, 71 y.o.   MRN: [id number]  HPI F never smoker followed for allergic rhinitis, rhinosinusitis, asthmatic bronchitis, ILD, complicated by , GERD, rheumatoid arthritis, ASCVD PFT 09/03/2015-mild obstructive airways disease, minimal restriction, mild diffusion defect. FVC 2.17/83%, FEV1 1.80/92%, ratio 0.83. No response to bronchodilator. TLC 78%, DLCO 73%.  CT chest High Resolution 03/14/2016 1. Mild basilar predominant subpleural reticulation and traction bronchiolectasis, grossly stable, possibly due to nonspecific interstitial pneumonitis. Usual interstitial pneumonitis is not Excluded. Nuclear stress test 05/20/2016-no evidence of significant blockage Echocardiogram 05/11/2016- WNL EF 55-60% Respiratory Viral Panel 08/01/16- POSITIVE Influenza A H3 Unattended Home Sleep Test 08/12/2016-AHI 18.2/hour, desaturation to 75% with average 88%, body weight 180 pounds CPAP titration sleep study 11/09/16.-6 cwp-  Needed supplemental oxygen because of sustained low saturation with 17.5 minutes recording saturation less than 88% on CPAP --------------------------------------------------------  09/21/2016-71 year old female never smoker followed for allergic rhinitis, rhinosinusitis, asthmatic bronchitis, OSA,complicated by GERD/Crohn's, Rheumatoid arthritis/prednisone FOLLOWS FOR: Pt states she continues to use neb machine TID-feels like it helping slightly but not getting better. Describes persistent cough with sputum intermittently green, no fever, bothersome feeling of phlegm in throat. She was told at cardiac cath that she desaturated lying down Requests handicap parking. Significant shortness of breath trying to walk in here from parking lot. Respiratory viral culture 08/01/2016-positive for influenza A Unattended Home Sleep Test 08/12/2016- AHI 18.2/hour, desaturation to 75% with average 88%, body weight 180 pounds CXR  07/28/2016 IMPRESSION: No pulmonary edema. Mild hyperinflation and chronic interstitial prominence again noted. Mild perihilar bronchitic changes. Streaky left base retrocardiac atelectasis or early infiltrate. No pulmonary Edema  11/21/16- 71 year old female never smoker followed for allergic rhinitis, rhinosinusitis, asthmatic bronchitis, ILD, OSA,complicated by GERD/Crohn's, Rheumatoid arthritis/prednisone FOLLOW UP FOR to get the result of her sleep study. CPAP titration sleep study 11/09/16.-6 cwp-  Needed supplemental oxygen because of sustained low saturation with 17.5 minutes recording saturation less than 88% on CPAP She doesn't like the idea of CPAP or resumption of home oxygen. We discussed oral appliances. I suggested we start CPAP and check overnight oximetry on room air on CPAP. If she doesn't need oxygen based on this, we can leave that off. If she does still seem to need oxygen, we will order it based on the documentation from her CPAP titration study of 4/25. Starting CPAP auto 5-15 today We reviewed images of chest CT from 03/14/16 again to point out the interstitial fibrotic changes which we will track.  ROS-see HPI Constitutional:     weight loss, night sweats, fevers, chills, fatigue, lassitude. HEENT:   No-  headaches, difficulty swallowing, tooth/dental problems, sore throat,       No-  sneezing, itching, ear ache,  nasal congestion, +post nasal drip,  CV:  No-   chest pain, orthopnea, PND, swelling in lower extremities, anasarca,                                                      dizziness, palpitations Resp: +  shortness of breath with exertion or at rest.               + productive cough, non-productive cough,  No- coughing up of blood.             + change  in color of mucus.  +- wheezing.   Skin: No-   rash or lesions. GI:  No-   heartburn, indigestion, abdominal pain, nausea, vomiting,  GU:  MS: +  joint pain or swelling.   Neuro-     nothing unusual Psych:  No-  change in mood or affect. No depression or anxiety.  No memory loss.  Objective:  OBJ- Physical Exam  General- Alert, Oriented, Affect-appropriate, Distress- none acute, + Obese Skin- + stasis changes  lower legs Lymphadenopathy- none Head- atraumatic            Eyes- Gross vision intact, PERRLA, conjunctivae and secretions clear            Ears- Hearing, canals-normal            Nose- Clear, no-Septal dev, mucus, polyps, erosion, perforation             Throat- Mallampati II , mucosa-clear, drainage- none, tonsils-                                               atrophic, voice normal,  Neck- flexible , trachea midline, no stridor , thyroid nl, carotid no bruit Chest - symmetrical excursion , unlabored           Heart/CV- RRR , no murmur , no gallop  , no rub, nl s1 s2                           - JVD- none , edema- none, stasis changes+, varices- none           Lung-   rattling cough +, unlabored, end- expiratory wheeze +           Chest wall-  Abd-  Br/ Gen/ Rectal- Not done, not indicated Extrem- +scar from treated wound,  Neuro- grossly intact to observation

## 2016-11-22 ENCOUNTER — Encounter (HOSPITAL_COMMUNITY): Payer: Medicare Other

## 2016-11-22 NOTE — Assessment & Plan Note (Signed)
After discussion, she will begin CPAP auto 5-15 and we will check overnight oximetry on this before ordering oxygen which would be based on her CPAP titration study, if needed

## 2016-11-22 NOTE — Assessment & Plan Note (Signed)
We will continue to track interstitial disease for progression. Most likely related to her rheumatoid arthritis.

## 2016-11-23 ENCOUNTER — Encounter (HOSPITAL_COMMUNITY)
Admission: RE | Admit: 2016-11-23 | Discharge: 2016-11-23 | Disposition: A | Discharge status: Home / Self Care | Payer: Medicare Other | Source: Ambulatory Visit | Attending: Family Medicine | Admitting: Family Medicine

## 2016-11-23 ENCOUNTER — Encounter (HOSPITAL_COMMUNITY): Payer: Medicare Other

## 2016-11-23 DIAGNOSIS — E041 Nontoxic single thyroid nodule: Secondary | ICD-10-CM | POA: Diagnosis not present

## 2016-11-23 DIAGNOSIS — R946 Abnormal results of thyroid function studies: Secondary | ICD-10-CM | POA: Diagnosis not present

## 2016-11-23 MED ORDER — SODIUM PERTECHNETATE TC 99M INJECTION
9.9900 | Freq: Once | INTRAVENOUS | Status: AC | PRN
  Administered 2016-11-23: 9.99 via INTRAVENOUS

## 2016-11-28 ENCOUNTER — Ambulatory Visit: Payer: Medicare Other | Admitting: Internal Medicine

## 2016-12-01 ENCOUNTER — Other Ambulatory Visit: Payer: Self-pay | Admitting: Family Medicine

## 2016-12-01 DIAGNOSIS — E041 Nontoxic single thyroid nodule: Secondary | ICD-10-CM

## 2016-12-09 ENCOUNTER — Ambulatory Visit
Admission: RE | Admit: 2016-12-09 | Discharge: 2016-12-09 | Disposition: A | Discharge status: Home / Self Care | Payer: Medicare Other | Source: Ambulatory Visit | Attending: Family Medicine | Admitting: Family Medicine

## 2016-12-09 DIAGNOSIS — E049 Nontoxic goiter, unspecified: Secondary | ICD-10-CM | POA: Diagnosis not present

## 2016-12-09 DIAGNOSIS — E041 Nontoxic single thyroid nodule: Secondary | ICD-10-CM

## 2016-12-19 DIAGNOSIS — M79672 Pain in left foot: Secondary | ICD-10-CM | POA: Diagnosis not present

## 2016-12-19 DIAGNOSIS — T8484XA Pain due to internal orthopedic prosthetic devices, implants and grafts, initial encounter: Secondary | ICD-10-CM | POA: Diagnosis not present

## 2016-12-19 DIAGNOSIS — Q666 Other congenital valgus deformities of feet: Secondary | ICD-10-CM | POA: Diagnosis not present

## 2016-12-19 DIAGNOSIS — M2012 Hallux valgus (acquired), left foot: Secondary | ICD-10-CM | POA: Insufficient documentation

## 2016-12-19 DIAGNOSIS — M19079 Primary osteoarthritis, unspecified ankle and foot: Secondary | ICD-10-CM | POA: Insufficient documentation

## 2016-12-19 DIAGNOSIS — S93135A Subluxation of interphalangeal joint of left lesser toe(s), initial encounter: Secondary | ICD-10-CM | POA: Insufficient documentation

## 2016-12-19 DIAGNOSIS — M2140 Flat foot [pes planus] (acquired), unspecified foot: Secondary | ICD-10-CM | POA: Diagnosis not present

## 2016-12-26 DIAGNOSIS — M0609 Rheumatoid arthritis without rheumatoid factor, multiple sites: Secondary | ICD-10-CM | POA: Diagnosis not present

## 2016-12-26 DIAGNOSIS — M2550 Pain in unspecified joint: Secondary | ICD-10-CM | POA: Diagnosis not present

## 2016-12-26 DIAGNOSIS — J849 Interstitial pulmonary disease, unspecified: Secondary | ICD-10-CM | POA: Diagnosis not present

## 2016-12-26 DIAGNOSIS — R5383 Other fatigue: Secondary | ICD-10-CM | POA: Diagnosis not present

## 2016-12-26 DIAGNOSIS — Z79899 Other long term (current) drug therapy: Secondary | ICD-10-CM | POA: Diagnosis not present

## 2016-12-26 DIAGNOSIS — E663 Overweight: Secondary | ICD-10-CM | POA: Diagnosis not present

## 2016-12-26 DIAGNOSIS — M150 Primary generalized (osteo)arthritis: Secondary | ICD-10-CM | POA: Diagnosis not present

## 2016-12-26 DIAGNOSIS — Z6833 Body mass index (BMI) 33.0-33.9, adult: Secondary | ICD-10-CM | POA: Diagnosis not present

## 2016-12-30 ENCOUNTER — Other Ambulatory Visit: Payer: Self-pay | Admitting: Family Medicine

## 2016-12-30 ENCOUNTER — Ambulatory Visit
Admission: RE | Admit: 2016-12-30 | Discharge: 2016-12-30 | Disposition: A | Discharge status: Home / Self Care | Payer: Medicare Other | Source: Ambulatory Visit | Attending: Family Medicine | Admitting: Family Medicine

## 2016-12-30 DIAGNOSIS — R918 Other nonspecific abnormal finding of lung field: Secondary | ICD-10-CM | POA: Diagnosis not present

## 2016-12-30 DIAGNOSIS — R100 Acute abdomen: Secondary | ICD-10-CM

## 2016-12-30 DIAGNOSIS — Z01810 Encounter for preprocedural cardiovascular examination: Secondary | ICD-10-CM | POA: Diagnosis not present

## 2016-12-30 DIAGNOSIS — Z01818 Encounter for other preprocedural examination: Secondary | ICD-10-CM

## 2017-01-03 ENCOUNTER — Encounter: Payer: Self-pay | Admitting: Internal Medicine

## 2017-01-09 ENCOUNTER — Encounter: Payer: Self-pay | Admitting: Internal Medicine

## 2017-01-09 ENCOUNTER — Ambulatory Visit (INDEPENDENT_AMBULATORY_CARE_PROVIDER_SITE_OTHER): Payer: Medicare Other | Admitting: Internal Medicine

## 2017-01-09 VITALS — BP 124/64 | HR 64 | Ht 61.0 in | Wt 175.0 lb

## 2017-01-09 DIAGNOSIS — I2510 Atherosclerotic heart disease of native coronary artery without angina pectoris: Secondary | ICD-10-CM | POA: Diagnosis not present

## 2017-01-09 DIAGNOSIS — G4733 Obstructive sleep apnea (adult) (pediatric): Secondary | ICD-10-CM

## 2017-01-09 DIAGNOSIS — I272 Pulmonary hypertension, unspecified: Secondary | ICD-10-CM | POA: Diagnosis not present

## 2017-01-09 DIAGNOSIS — R0602 Shortness of breath: Secondary | ICD-10-CM

## 2017-01-09 DIAGNOSIS — E785 Hyperlipidemia, unspecified: Secondary | ICD-10-CM

## 2017-01-09 LAB — COMPREHENSIVE METABOLIC PANEL
ALT: 23 IU/L (ref 0–32)
AST: 24 IU/L (ref 0–40)
Albumin/Globulin Ratio: 1.8 (ref 1.2–2.2)
Albumin: 4.2 g/dL (ref 3.5–4.8)
Alkaline Phosphatase: 88 IU/L (ref 39–117)
BUN/Creatinine Ratio: 20 (ref 12–28)
BUN: 18 mg/dL (ref 8–27)
Bilirubin Total: 0.4 mg/dL (ref 0.0–1.2)
CO2: 23 mmol/L (ref 20–29)
Calcium: 10.3 mg/dL (ref 8.7–10.3)
Chloride: 105 mmol/L (ref 96–106)
Creatinine, Ser: 0.92 mg/dL (ref 0.57–1.00)
GFR calc Af Amer: 72 mL/min/{1.73_m2} (ref >59)
GFR calc non Af Amer: 62 mL/min/{1.73_m2} (ref >59)
Globulin, Total: 2.3 g/dL (ref 1.5–4.5)
Glucose: 86 mg/dL (ref 65–99)
Potassium: 4.3 mmol/L (ref 3.5–5.2)
Sodium: 142 mmol/L (ref 134–144)
Total Protein: 6.5 g/dL (ref 6.0–8.5)

## 2017-01-09 LAB — LDL CHOLESTEROL, DIRECT: LDL Direct: 53 mg/dL (ref 0–99)

## 2017-01-09 LAB — LIPID PANEL
Chol/HDL Ratio: 2.4 ratio (ref 0.0–4.4)
Cholesterol, Total: 127 mg/dL (ref 100–199)
HDL: 53 mg/dL (ref >39)
LDL Calculated: 38 mg/dL (ref 0–99)
Triglycerides: 181 mg/dL — ABNORMAL HIGH (ref 0–149)
VLDL Cholesterol Cal: 36 mg/dL (ref 5–40)

## 2017-01-09 MED ORDER — FUROSEMIDE 40 MG PO TABS: 40.0000 mg | ORAL_TABLET | Freq: Two times a day (BID) | ORAL | 1 refills | Status: DC

## 2017-01-09 MED ORDER — POTASSIUM CHLORIDE CRYS ER 20 MEQ PO TBCR: 20.0000 meq | EXTENDED_RELEASE_TABLET | Freq: Three times a day (TID) | ORAL | 1 refills | Status: DC

## 2017-01-09 MED ORDER — ROSUVASTATIN CALCIUM 20 MG PO TABS: 20.0000 mg | ORAL_TABLET | Freq: Every day | ORAL | 1 refills | Status: DC

## 2017-01-09 NOTE — Progress Notes (Signed)
Follow-up Outpatient Visit Date: 01/09/2017  Chief Complaint: Shortness of breath  HPI:  Ms. Terhaar is a 71 y.o. year-old female with history of asthmatic bronchitis, GERD, rheumatoid arthritis, CVA (small bilateral basal ganglia lacunar infarcts noted by brain MRI in 11/2012), prediabetes with hemoglobin A1c of 6.0 in 2014, irritable bowel syndrome, and allergic rhinitis, who presents for follow-up of shortness of breath. I last saw her on 10/24/16 following preceding left and right heart catheterization. This was notable for nonobstructive CAD and mild pulmonary hypertension. We agreed to continue with medical therapy and have Ms. Chaney proceed with sleep study and further pulmonary management by Dr. Annamaria Boots.  Today, Ms. Lanagan reports that her breathing continues to improve. She is able to ambulate without significant respiratory limitation. Her biggest issue at this time is pain involving the left foot, which was complicated by a fracture of the left second toe this spring. She is planning to undergo surgical intervention by an orthopedist at [facility]. She has not had any chest pain, palpitations, lightheadedness, orthopnea, or PND. She has been wearing CPAP for just over a month and reports improved energy and better sleep at night. Her only concern is of bruising across the bridge of her nose from the CPAP mask. Ms. Broberg is currently using furosemide 40 mg twice a day for management of her leg edema. On this dose, it is well controlled.  --------------------------------------------------------------------------------------------------  Cardiovascular History & Procedures: Cardiovascular Problems:  Shortness of breath  Non-obstructive coronary artery disease  Stroke  Risk Factors:  History of stroke, borderline diabetes, hyperlipidemia, obesity, and chronic steroid use  Cath/PCI:  L/RHC (09/16/16): LMCA normal. LAD with 50% midvessel stenosis. LCx normal. 20% proximal RCA. LVEDP 15. RA 13, RV  38/12, PA 43/23 (32), PCWP 16. Ventricular concordance noted. Fick CO/CI 6.4/3.7. PA sat 70%, RA sat 75%, Ao sat 94%. PVR 2.5 Wood Units.  CV Surgery:  None  EP Procedures and Devices:  None  Non-Invasive Evaluation(s):  Carotid Doppler (10/26/16): Heterogeneous plaque, bilaterally. Tortuous mid and distal ICA's, bilaterally. 1-39% bilateral ICA stenosis. Normal subclavian arteries, bilaterally. Patent vertebral arteries with antegrade flow. Subcentimeter hypoechoic nodule in the thyroid.  Transthoracic echocardiogram (05/11/16): Normal LV size and in traction (EF 55-60%). Normal LV wall motion. Grade 1 diastolic. Normal RV size and function. Mild mitral valve thickening. Mild TR.  Pharmacologic myocardial perfusion stress test (05/11/16): Low risk study without ischemia or scar. LVEF 62%.  Bilateral lower extremity venous duplex (04/07/16): No evidence of DVT though left peroneal vein was suboptimally imaged.  Transthoracic echocardiogram (12/17/12): Normal LV size with abnormal septal motion and EF of 50%. Grade 1 diastolic dysfunction noted. Aortic sclerosis without stenosis or regurgitation evident. Trivial MR and TR without other significant valvular disease. Normal PA systolic and central venous pressures.  Bilateral lower extremity arterial duplex (12/05/12): Bilateral ABIs 1.2. No significant stenosis identified.  Recent CV Pertinent Labs: Lab Results  Component Value Date   INR 1.0 09/12/2016   K 4.1 09/23/2016   BUN 10 09/23/2016   CREATININE 0.87 09/23/2016   CREATININE 0.91 05/30/2016    Past medical and surgical history were reviewed and updated in EPIC.  Current Meds  Medication Sig  . albuterol (PROVENTIL) (2.5 MG/3ML) 0.083% nebulizer solution UE 1 VIAL IN NEBULIZER EVERY 6 HOURS AS NEEDED FOR WHEEZING OR SHORTNESS OF BREATH  . aspirin EC 81 MG tablet Take 81 mg by mouth daily.  . cetirizine (ZYRTEC) 10 MG tablet Take 10 mg by mouth daily.  Marland Kitchen  clidinium-chlordiazePOXIDE (LIBRAX) 5-2.5 MG capsule Take 1 capsule by mouth 2 (two) times daily as needed (for IBS).  . colestipol (COLESTID) 1 G tablet Take 1 g by mouth 2 (two) times daily.   Marland Kitchen FLUoxetine (PROZAC) 20 MG capsule Take 20 mg by mouth daily.  . fluticasone (FLONASE) 50 MCG/ACT nasal spray Place 2 sprays into the nose daily.   . furosemide (LASIX) 40 MG tablet Take 1 tablet (40 mg total) by mouth 2 (two) times daily.  Marland Kitchen glycopyrrolate (ROBINUL) 1 MG tablet Take 1 mg by mouth 3 (three) times daily as needed (for IBS).   . Glycopyrrolate-Formoterol (BEVESPI AEROSPHERE) 9-4.8 MCG/ACT AERO Inhale 2 puffs into the lungs 2 (two) times daily.  . hydroxychloroquine (PLAQUENIL) 200 MG tablet Take 600 mg by mouth daily.   . hydroxypropyl methylcellulose / hypromellose (ISOPTO TEARS / GONIOVISC) 2.5 % ophthalmic solution Place 1-2 drops into both eyes 3 (three) times daily as needed for dry eyes.  Marland Kitchen ipratropium (ATROVENT) 0.03 % nasal spray USE 1 TO 2 SPRAYS INTO BOTH NOSTRILS 2 (TWO) TIMES  DAILY.  Marland Kitchen leflunomide (ARAVA) 20 MG tablet Take 20 mg by mouth daily.   . Melatonin 10 MG TABS Take 10 mg by mouth at bedtime.  . mirtazapine (REMERON) 45 MG tablet Take 45 mg by mouth at bedtime.  . montelukast (SINGULAIR) 10 MG tablet TAKE 1 TABLET BY MOUTH AT  BEDTIME  . Multiple Vitamin (MULTIVITAMIN) tablet Take 1 tablet by mouth daily.  Marland Kitchen omeprazole (PRILOSEC) 40 MG capsule Take 40 mg by mouth daily.  . potassium chloride SA (KLOR-CON M20) 20 MEQ tablet Take 1 tablet (20 mEq total) by mouth 3 (three) times daily.  . rosuvastatin (CRESTOR) 20 MG tablet Take 1 tablet (20 mg total) by mouth daily.  . traMADol (ULTRAM) 50 MG tablet Take 50 mg by mouth 2 (two) times daily.   Marland Kitchen umeclidinium-vilanterol (ANORO ELLIPTA) 62.5-25 MCG/INH AEPB Inhale 1 puff into the lungs daily.  . verapamil (CALAN-SR) 240 MG CR tablet Take 240 mg by mouth daily.    Allergies: Erythromycin base; Lactose intolerance (gi);  Lactulose; Mesalamine; Nitrofuran derivatives; Nitrofurantoin; Other; Sulfa antibiotics; Tdap [diphth-acell pertussis-tetanus]; and Tetanus toxoid, adsorbed  Social History   Social History  . Marital status: Married    Spouse name: N/A  . Number of children: 2  . Years of education: N/A   Occupational History  . Not on file.   Social History Main Topics  . Smoking status: Never Smoker  . Smokeless tobacco: Never Used     Comment: positive passive tobacco smoke exposure  . Alcohol use Yes     Comment: glass of wine each night   . Drug use: No  . Sexual activity: Yes   Other Topics Concern  . Not on file   Social History Narrative   Exercise - at least 2 times weekly   Caffeine - 2 cups in the morning          Family History  Problem Relation Age of Onset  . Heart disease Mother   . Arthritis Mother   . Heart attack Father   . Diabetes Other        sibling  . Heart attack Other        sibling    Review of Systems: A 12-system review of systems was performed and was negative except as noted in the HPI.   --------------------------------------------------------------------------------------------------  Physical Exam: BP 124/64   Pulse 64   Ht _0  (  1.549 m)   Wt 175 lb (79.4 kg)   BMI 33.07 kg/m   General:  Obese woman, seated comfortably in the exam room. She is accompanied by her husband. HEENT: No conjunctival pallor or scleral icterus.  Moist mucous membranes.  OP clear. Neck: Supple without lymphadenopathy, thyromegaly, JVD, or HJR. Lungs: Normal work of breathing.  Clear to auscultation bilaterally without wheezes or crackles. Heart: Regular rate and rhythm without murmurs, rubs, or gallops. Unable to assess PMI due to body habitus. Abd: Bowel sounds present.  Soft, NT/ND without hepatosplenomegaly Ext: Trace left ankle edema. Chronic deformities of the left ankle and foot noted.  Radial, PT, and DP pulses are 2+ bilaterally. Skin: Warm and dry  without rash.  Lab Results  Component Value Date   WBC 9.2 09/23/2016   HGB 14.4 09/23/2016   HCT 44.7 09/23/2016   MCV 88 09/23/2016   PLT 342 09/23/2016    Lab Results  Component Value Date   NA 140 09/23/2016   K 4.1 09/23/2016   CL 102 09/23/2016   CO2 22 09/23/2016   BUN 10 09/23/2016   CREATININE 0.87 09/23/2016   GLUCOSE 87 09/23/2016   ALT 17 09/23/2016   --------------------------------------------------------------------------------------------------  ASSESSMENT AND PLAN: Shortness of breath This has improved significantly with interventions by Dr. Annamaria Boots as well as diuresis. I suspect this was multifactorial, including pulmonary hypertension and underlying parenchymal lung disease. We will continue our current medication regimen, including furosemide 40 mg twice a day. We will check a complete metabolic panel today to ensure normal renal function and electrolytes.  Nonobstructive coronary artery disease No new symptoms to suggest worsening coronary insufficiency. Left heart catheterization in March showed 50% mid LAD disease. Ms. Adorno is tolerating low-dose aspirin and statin therapy well. I will check her liver function today as well as a repeat lipid panel to ensure adequate response.  Hyperlipidemia Ms. Cogan is tolerating rosuvastatin well. We will check a CMP and lipid panel today to assess response.  Pulmonary hypertension Ms. Uffelman appears euvolemic with NYHA class II symptoms. At this time, her mobility is mostly limited by her left leg and foot pain. As above, we will continue our current diuretic regimen and recheck her renal function and electrolytes today. I will defer further management to Dr. Annamaria Boots.  Obstructive sleep apnea Ms. Guymon is now wearing CPAP and has noticed improved energy and better sleep at night. I encouraged her to continue wearing this and to follow-up with Dr. Annamaria Boots for ongoing management.  Follow-up: Return to clinic in 6  months.  Nelva Bush, MD 01/09/2017 10:07 AM

## 2017-01-09 NOTE — Patient Instructions (Signed)
Medication Instructions:  Your physician recommends that you continue on your current medications as directed. Please refer to the Current Medication list given to you today.   Labwork: Lipid profile/direct LDL/CMET today  Testing/Procedures: none  Follow-Up: Your physician recommends that you schedule a follow-up appointment in: 6 months with Dr End.        If you need a refill on your cardiac medications before your next appointment, please call your pharmacy.

## 2017-01-16 DIAGNOSIS — M25511 Pain in right shoulder: Secondary | ICD-10-CM | POA: Diagnosis not present

## 2017-01-16 DIAGNOSIS — M542 Cervicalgia: Secondary | ICD-10-CM | POA: Diagnosis not present

## 2017-01-16 DIAGNOSIS — M19012 Primary osteoarthritis, left shoulder: Secondary | ICD-10-CM | POA: Diagnosis not present

## 2017-01-26 ENCOUNTER — Encounter: Payer: Self-pay | Admitting: Internal Medicine

## 2017-02-15 DIAGNOSIS — M19072 Primary osteoarthritis, left ankle and foot: Secondary | ICD-10-CM | POA: Diagnosis not present

## 2017-02-15 DIAGNOSIS — M2141 Flat foot [pes planus] (acquired), right foot: Secondary | ICD-10-CM | POA: Diagnosis not present

## 2017-02-15 DIAGNOSIS — M19079 Primary osteoarthritis, unspecified ankle and foot: Secondary | ICD-10-CM | POA: Diagnosis not present

## 2017-02-15 DIAGNOSIS — S93135D Subluxation of interphalangeal joint of left lesser toe(s), subsequent encounter: Secondary | ICD-10-CM | POA: Diagnosis not present

## 2017-02-15 DIAGNOSIS — T8484XA Pain due to internal orthopedic prosthetic devices, implants and grafts, initial encounter: Secondary | ICD-10-CM | POA: Diagnosis not present

## 2017-02-15 DIAGNOSIS — M2142 Flat foot [pes planus] (acquired), left foot: Secondary | ICD-10-CM | POA: Diagnosis not present

## 2017-02-15 DIAGNOSIS — M2012 Hallux valgus (acquired), left foot: Secondary | ICD-10-CM | POA: Diagnosis not present

## 2017-02-15 DIAGNOSIS — Q666 Other congenital valgus deformities of feet: Secondary | ICD-10-CM | POA: Diagnosis not present

## 2017-02-16 ENCOUNTER — Telehealth: Payer: Self-pay | Admitting: Internal Medicine

## 2017-02-16 NOTE — Telephone Encounter (Signed)
Called and spoke with pt and she is aware of appt with CY on 8/7 at 11:30 per KW>

## 2017-02-16 NOTE — Telephone Encounter (Signed)
New Message  Pt call requesting to speak with RN. Pt states she did not now she would need to get a surgical clearance from Dr. Saunders Revel at her last appt for her surgery in October. The pt was inform that because it is a surgical clearance the providers with need to communicate with one another for various medical reasons. Pt states she is getting the run around from the department she is receiving the procedure. Pt would like to speak with RN. Please call back to discuss

## 2017-02-16 NOTE — Telephone Encounter (Signed)
Called patient and spoke with her about situation. Patient states that she is supposed to have foot surgery in October by a doctor at [facility] in [place]. Patient states that she was in to see Dr. Saunders Revel a few months back but did not mention this to him because she was unaware that she would have to have clearance from him. Patient states that the office doing the surgery is giving her a run around and states that they do not send out surgical clearance forms for each patient. She advised me that she will have to have something typed up from Dr. Saunders Revel to send to the surgeon in [place] so they that will do the surgery in October. I advised patient that I will send a message to Dr. Marisue Humble nurse so that she could further evaluate the situation based on Dr. Marisue Humble advisement. Advised patient that Webb Silversmith was out of the office today and would be back tomorrow. Patient thanked me and verbalized understanding.

## 2017-02-17 NOTE — Telephone Encounter (Signed)
Pt states she is scheduled for foot surgery 02/28/17 by Dr Gardiner Fanti, Vinnie Level is surgical coordinator [phone].

## 2017-02-17 NOTE — Telephone Encounter (Signed)
I will forward to Dr End for review.

## 2017-02-17 NOTE — Telephone Encounter (Signed)
   [facility] Pre-operative Risk Assessment    Request for surgical clearance:  1. What type of surgery is being performed? Left ankle fusion with subtalar calcaneal with lateral wedge removal of hardware, fibula osteotomy.  2. When is this surgery scheduled? August 14,2018  3. Are there any medications that need to be held prior to surgery and how long? Hold aspirin 5-7 days prior to surgery  4. Name of physician performing surgery? Dr Gardiner Fanti  5. What is your office phone and fax number? [facility] surgical coordinator. Phone [phone] Fax [phone]  Desiree Lucy 02/17/2017, 10:07 AM  _________________________________________________________________

## 2017-02-19 NOTE — Telephone Encounter (Signed)
I think it is fine for [name] to proceed with elective ankle surgery, given recent improvement in symptoms and non-obstructive CAD seen on cath earlier this year. She can hold aspirin prior to surgery and restart it when felt to be safe, as directed by Dr. Donnie Mesa. Thanks.  Nelva Bush, MD [facility] Pager: [phone]

## 2017-02-20 ENCOUNTER — Encounter: Payer: Self-pay | Admitting: Internal Medicine

## 2017-02-20 NOTE — Telephone Encounter (Signed)
Pt aware Dr End said fine for surgery, can hold aspirin prior to surgery, restart when safe as directed by Dr Donnie Mesa.

## 2017-02-20 NOTE — Telephone Encounter (Signed)
This has been faxed to Dr Donnie Mesa, attention Vinnie Level.

## 2017-02-21 ENCOUNTER — Encounter: Payer: Self-pay | Admitting: Internal Medicine

## 2017-02-21 ENCOUNTER — Ambulatory Visit (INDEPENDENT_AMBULATORY_CARE_PROVIDER_SITE_OTHER): Payer: Medicare Other | Admitting: Internal Medicine

## 2017-02-21 VITALS — BP 112/70 | HR 75 | Ht 61.0 in | Wt 182.4 lb

## 2017-02-21 DIAGNOSIS — I2510 Atherosclerotic heart disease of native coronary artery without angina pectoris: Secondary | ICD-10-CM

## 2017-02-21 DIAGNOSIS — J849 Interstitial pulmonary disease, unspecified: Secondary | ICD-10-CM

## 2017-02-21 DIAGNOSIS — J4540 Moderate persistent asthma, uncomplicated: Secondary | ICD-10-CM | POA: Diagnosis not present

## 2017-02-21 DIAGNOSIS — J209 Acute bronchitis, unspecified: Secondary | ICD-10-CM | POA: Diagnosis not present

## 2017-02-21 DIAGNOSIS — G4733 Obstructive sleep apnea (adult) (pediatric): Secondary | ICD-10-CM | POA: Diagnosis not present

## 2017-02-21 NOTE — Patient Instructions (Addendum)
Order- [facility]  Please refit CPAP mask of choice and head gear- Current head gear is causing neck pain                Continue CPAP auto 5-20, supplies, humidifier, AirView      Dx OSA  I will send a letter to your orthopedist.  Use CPAP while you are at [place]- office spirometry   Dx asthmatic bronchitis, moderate persistent, uncomplicated

## 2017-02-21 NOTE — Assessment & Plan Note (Signed)
She is at baseline today with no recent exacerbation, no recent infection, clear lungs on exam and no obstructive airways apparent on office spirometry. Plan-because of her comorbidities, she is at higher than average risk of pulmonary complications during anticipated surgery and general anesthesia, but I think she is as good as we can expect her to be and I have no reason to delay planned surgery.

## 2017-02-21 NOTE — Assessment & Plan Note (Signed)
She has had some interstitial disease on CT which has been stable, and most consistent with rheumatoid lung disease. We will anticipate follow-up CT chest with high-resolution when she is mobile again after ankle surgery.

## 2017-02-21 NOTE — Progress Notes (Signed)
Subjective:    [name], female    DOB: April 03, 1946, 71 y.o.   MRN: [id number]  HPI F never smoker followed for allergic rhinitis, rhinosinusitis, asthmatic bronchitis, ILD, complicated by , GERD, rheumatoid arthritis, ASCVD PFT 09/03/2015-mild obstructive airways disease, minimal restriction, mild diffusion defect. FVC 2.17/83%, FEV1 1.80/92%, ratio 0.83. No response to bronchodilator. TLC 78%, DLCO 73%.  CT chest High Resolution 03/14/2016 1. Mild basilar predominant subpleural reticulation and traction bronchiolectasis, grossly stable, possibly due to nonspecific interstitial pneumonitis. Usual interstitial pneumonitis is not Excluded. Nuclear stress test 05/20/2016-no evidence of significant blockage Echocardiogram 05/11/2016- WNL EF 55-60% Respiratory Viral Panel 08/01/16- POSITIVE Influenza A H3 Unattended Home Sleep Test 08/12/2016-AHI 18.2/hour, desaturation to 75% with average 88%, body weight 180 pounds CPAP titration sleep study 11/09/16.-6 cwp-  Needed supplemental oxygen because of sustained low saturation with 17.5 minutes recording saturation less than 88% on CPAP Office Spirometry 02/21/17-moderate restriction consistent with body habitus. FVC 1.70/66%, FEV1 1.49/77%, ratio 0.87, FEF 25-75% 2.39/146%. --------------------------------------------------------  11/21/16- 71 year old female never smoker followed for allergic rhinitis, rhinosinusitis, asthmatic bronchitis, ILD, OSA,complicated by GERD/Crohn's, Rheumatoid arthritis/prednisone FOLLOW UP FOR to get the result of her sleep study. CPAP titration sleep study 11/09/16.-6 cwp-  Needed supplemental oxygen because of sustained low saturation with 17.5 minutes recording saturation less than 88% on CPAP She doesn't like the idea of CPAP or resumption of home oxygen. We discussed oral appliances. I suggested we start CPAP and check overnight oximetry on room air on CPAP. If she doesn't need oxygen based on this, we can  leave that off. If she does still seem to need oxygen, we will order it based on the documentation from her CPAP titration study of 4/25. Starting CPAP auto 5-15 today We reviewed images of chest CT from 03/14/16 again to point out the interstitial fibrotic changes which we will track.  02/21/17- 71 year old female never smoker followed for allergic rhinitis, rhinosinusitis, asthmatic bronchitis, ILD, OSA,complicated by GERD/Crohn's, Rheumatoid arthritis/prednisone CPAP auto 5-15/Advanced                 down load 01/03/17-100% compliance, AHI 3.1/ hr. For pulmonary preoperative surgical clearance- Reconstructive surgery on left ankle. Scheduled for 02/28/17.  Nebulizer albuterol,  Anoro Her breathing has been comfortable with no recent infections and no routine cough or wheeze. She has not needed her nebulizer machine for rescue inhaler, and is satisfied using Anoro 1 puff daily. CPAP download: Good compliance 90%/4 hours per night, averaging over 7 hours. AHI 1.7/hour. She needs to work with her DME to get a more comfortable mask. The one she is using presses at her nose and the strap crauses occipital headaches. CXR 12/30/16  No active cardiopulmonary disease.  Aortic atherosclerosis. Office Spirometry 02/21/17-moderate restriction consistent with body habitus. FVC 1.70/66%, FEV1 1.49/77%, ratio 0.87, FEF 25-75% 2.39/146%.  // Will need f/u CT chest HR to reassess ? Interstitial disease after recovery from ankle surgery//  ROS-see HPI Constitutional:     weight loss, night sweats, fevers, chills, fatigue, lassitude. HEENT:   No-  headaches, difficulty swallowing, tooth/dental problems, sore throat,       No-  sneezing, itching, ear ache,  nasal congestion, +post nasal drip,  CV:  No-   chest pain, orthopnea, PND, swelling in lower extremities, anasarca,  dizziness, palpitations Resp: +  shortness of breath with exertion or at rest.               +  productive cough, non-productive cough,  No- coughing up of blood.             + change in color of mucus.  +- wheezing.   Skin: No-   rash or lesions. GI:  No-   heartburn, indigestion, abdominal pain, nausea, vomiting,  GU:  MS: +  joint pain or swelling.   Neuro-     nothing unusual Psych:  No- change in mood or affect. No depression or anxiety.  No memory loss.  Objective:  OBJ- Physical Exam  General- Alert, Oriented, Affect-appropriate, Distress- none acute, + Obese, Skin- + stasis changes  lower legs Lymphadenopathy- none Head- atraumatic            Eyes- Gross vision intact, PERRLA, conjunctivae and secretions clear            Ears- Hearing, canals-normal            Nose- Clear, no-Septal dev, mucus, polyps, erosion, perforation             Throat- Mallampati II , mucosa-clear, drainage- none, tonsils-                                               atrophic, voice normal,  Neck- flexible , trachea midline, no stridor , thyroid nl, carotid no bruit Chest - symmetrical excursion , unlabored           Heart/CV- RRR , no murmur , no gallop  , no rub, nl s1 s2                           - JVD- none , edema- none, stasis changes+, varices- none           Lung-   clear, cough -none, unlabored, wheeze-none,    room air saturation 94% at rest           Chest wall-  Abd-  Br/ Gen/ Rectal- Not done, not indicated Extrem- +scar from treated wound,  Neuro- grossly intact to observation

## 2017-02-21 NOTE — Assessment & Plan Note (Signed)
She reports that she sleeps better with CPAP although she needs some help refitted mask now. We will ask her DME company to see what they can do. Compliance and control are satisfactory with AutoPap 5-15. She should use her CPAP during her surgical hospitalization and I've advised that she take her own mask. She can check with the hospital and with anesthesia about taking her own machine. She asks that I fax a letter to her orthopedist indicating that she can go ahead with planned surgery.

## 2017-02-27 ENCOUNTER — Ambulatory Visit: Payer: Medicare Other | Admitting: Internal Medicine

## 2017-02-28 DIAGNOSIS — M19072 Primary osteoarthritis, left ankle and foot: Secondary | ICD-10-CM | POA: Diagnosis not present

## 2017-02-28 DIAGNOSIS — Q666 Other congenital valgus deformities of feet: Secondary | ICD-10-CM | POA: Diagnosis not present

## 2017-02-28 DIAGNOSIS — Z887 Allergy status to serum and vaccine status: Secondary | ICD-10-CM | POA: Diagnosis not present

## 2017-02-28 DIAGNOSIS — S93135A Subluxation of interphalangeal joint of left lesser toe(s), initial encounter: Secondary | ICD-10-CM | POA: Diagnosis not present

## 2017-02-28 DIAGNOSIS — J45909 Unspecified asthma, uncomplicated: Secondary | ICD-10-CM | POA: Diagnosis not present

## 2017-02-28 DIAGNOSIS — Z79891 Long term (current) use of opiate analgesic: Secondary | ICD-10-CM | POA: Diagnosis not present

## 2017-02-28 DIAGNOSIS — Z96651 Presence of right artificial knee joint: Secondary | ICD-10-CM | POA: Diagnosis not present

## 2017-02-28 DIAGNOSIS — Z9851 Tubal ligation status: Secondary | ICD-10-CM | POA: Diagnosis not present

## 2017-02-28 DIAGNOSIS — Z7982 Long term (current) use of aspirin: Secondary | ICD-10-CM | POA: Diagnosis not present

## 2017-02-28 DIAGNOSIS — M2142 Flat foot [pes planus] (acquired), left foot: Secondary | ICD-10-CM | POA: Diagnosis not present

## 2017-02-28 DIAGNOSIS — Z882 Allergy status to sulfonamides status: Secondary | ICD-10-CM | POA: Diagnosis not present

## 2017-02-28 DIAGNOSIS — T8484XA Pain due to internal orthopedic prosthetic devices, implants and grafts, initial encounter: Secondary | ICD-10-CM | POA: Diagnosis not present

## 2017-02-28 DIAGNOSIS — M069 Rheumatoid arthritis, unspecified: Secondary | ICD-10-CM | POA: Diagnosis not present

## 2017-02-28 DIAGNOSIS — G8918 Other acute postprocedural pain: Secondary | ICD-10-CM | POA: Diagnosis not present

## 2017-02-28 DIAGNOSIS — Z881 Allergy status to other antibiotic agents status: Secondary | ICD-10-CM | POA: Diagnosis not present

## 2017-02-28 DIAGNOSIS — G4730 Sleep apnea, unspecified: Secondary | ICD-10-CM | POA: Diagnosis not present

## 2017-02-28 DIAGNOSIS — Z9889 Other specified postprocedural states: Secondary | ICD-10-CM | POA: Diagnosis not present

## 2017-02-28 DIAGNOSIS — Z90710 Acquired absence of both cervix and uterus: Secondary | ICD-10-CM | POA: Diagnosis not present

## 2017-02-28 DIAGNOSIS — M79672 Pain in left foot: Secondary | ICD-10-CM | POA: Diagnosis not present

## 2017-02-28 DIAGNOSIS — S93135D Subluxation of interphalangeal joint of left lesser toe(s), subsequent encounter: Secondary | ICD-10-CM | POA: Diagnosis not present

## 2017-02-28 DIAGNOSIS — Z888 Allergy status to other drugs, medicaments and biological substances status: Secondary | ICD-10-CM | POA: Diagnosis not present

## 2017-02-28 DIAGNOSIS — Z79899 Other long term (current) drug therapy: Secondary | ICD-10-CM | POA: Diagnosis not present

## 2017-02-28 DIAGNOSIS — M2012 Hallux valgus (acquired), left foot: Secondary | ICD-10-CM | POA: Diagnosis not present

## 2017-02-28 DIAGNOSIS — Z791 Long term (current) use of non-steroidal anti-inflammatories (NSAID): Secondary | ICD-10-CM | POA: Diagnosis not present

## 2017-03-01 DIAGNOSIS — M19072 Primary osteoarthritis, left ankle and foot: Secondary | ICD-10-CM | POA: Diagnosis not present

## 2017-03-01 DIAGNOSIS — M069 Rheumatoid arthritis, unspecified: Secondary | ICD-10-CM | POA: Diagnosis not present

## 2017-03-01 DIAGNOSIS — M2142 Flat foot [pes planus] (acquired), left foot: Secondary | ICD-10-CM | POA: Diagnosis not present

## 2017-03-01 DIAGNOSIS — M2012 Hallux valgus (acquired), left foot: Secondary | ICD-10-CM | POA: Diagnosis not present

## 2017-03-01 DIAGNOSIS — G4730 Sleep apnea, unspecified: Secondary | ICD-10-CM | POA: Diagnosis not present

## 2017-03-01 DIAGNOSIS — J45909 Unspecified asthma, uncomplicated: Secondary | ICD-10-CM | POA: Diagnosis not present

## 2017-03-06 ENCOUNTER — Other Ambulatory Visit: Payer: Self-pay | Admitting: Internal Medicine

## 2017-03-13 DIAGNOSIS — M19072 Primary osteoarthritis, left ankle and foot: Secondary | ICD-10-CM | POA: Diagnosis not present

## 2017-03-27 DIAGNOSIS — Z79899 Other long term (current) drug therapy: Secondary | ICD-10-CM | POA: Diagnosis not present

## 2017-03-27 DIAGNOSIS — M0609 Rheumatoid arthritis without rheumatoid factor, multiple sites: Secondary | ICD-10-CM | POA: Diagnosis not present

## 2017-03-27 DIAGNOSIS — M150 Primary generalized (osteo)arthritis: Secondary | ICD-10-CM | POA: Diagnosis not present

## 2017-03-27 DIAGNOSIS — M2550 Pain in unspecified joint: Secondary | ICD-10-CM | POA: Diagnosis not present

## 2017-03-27 DIAGNOSIS — J849 Interstitial pulmonary disease, unspecified: Secondary | ICD-10-CM | POA: Diagnosis not present

## 2017-03-29 DIAGNOSIS — M2142 Flat foot [pes planus] (acquired), left foot: Secondary | ICD-10-CM | POA: Diagnosis not present

## 2017-03-29 DIAGNOSIS — M216X2 Other acquired deformities of left foot: Secondary | ICD-10-CM | POA: Diagnosis not present

## 2017-03-29 DIAGNOSIS — M2141 Flat foot [pes planus] (acquired), right foot: Secondary | ICD-10-CM | POA: Diagnosis not present

## 2017-03-29 DIAGNOSIS — M2012 Hallux valgus (acquired), left foot: Secondary | ICD-10-CM | POA: Diagnosis not present

## 2017-03-29 DIAGNOSIS — S93135D Subluxation of interphalangeal joint of left lesser toe(s), subsequent encounter: Secondary | ICD-10-CM | POA: Diagnosis not present

## 2017-03-29 DIAGNOSIS — Q666 Other congenital valgus deformities of feet: Secondary | ICD-10-CM | POA: Diagnosis not present

## 2017-03-29 DIAGNOSIS — T8484XA Pain due to internal orthopedic prosthetic devices, implants and grafts, initial encounter: Secondary | ICD-10-CM | POA: Diagnosis not present

## 2017-03-29 DIAGNOSIS — M19072 Primary osteoarthritis, left ankle and foot: Secondary | ICD-10-CM | POA: Diagnosis not present

## 2017-04-06 DIAGNOSIS — M216X2 Other acquired deformities of left foot: Secondary | ICD-10-CM | POA: Diagnosis not present

## 2017-04-06 DIAGNOSIS — M2012 Hallux valgus (acquired), left foot: Secondary | ICD-10-CM | POA: Diagnosis not present

## 2017-04-06 DIAGNOSIS — S93145A Subluxation of metatarsophalangeal joint of left lesser toe(s), initial encounter: Secondary | ICD-10-CM | POA: Diagnosis not present

## 2017-04-06 DIAGNOSIS — M24575 Contracture, left foot: Secondary | ICD-10-CM | POA: Diagnosis not present

## 2017-04-06 DIAGNOSIS — M2142 Flat foot [pes planus] (acquired), left foot: Secondary | ICD-10-CM | POA: Diagnosis not present

## 2017-04-06 DIAGNOSIS — M2022 Hallux rigidus, left foot: Secondary | ICD-10-CM | POA: Diagnosis not present

## 2017-04-06 DIAGNOSIS — M2042 Other hammer toe(s) (acquired), left foot: Secondary | ICD-10-CM | POA: Diagnosis not present

## 2017-04-06 DIAGNOSIS — Q666 Other congenital valgus deformities of feet: Secondary | ICD-10-CM | POA: Diagnosis not present

## 2017-04-06 DIAGNOSIS — S93135A Subluxation of interphalangeal joint of left lesser toe(s), initial encounter: Secondary | ICD-10-CM | POA: Diagnosis not present

## 2017-04-09 ENCOUNTER — Other Ambulatory Visit: Payer: Self-pay | Admitting: Internal Medicine

## 2017-04-19 DIAGNOSIS — M24575 Contracture, left foot: Secondary | ICD-10-CM | POA: Diagnosis not present

## 2017-04-19 DIAGNOSIS — S93145D Subluxation of metatarsophalangeal joint of left lesser toe(s), subsequent encounter: Secondary | ICD-10-CM | POA: Diagnosis not present

## 2017-04-19 DIAGNOSIS — M2022 Hallux rigidus, left foot: Secondary | ICD-10-CM | POA: Diagnosis not present

## 2017-04-19 DIAGNOSIS — Q666 Other congenital valgus deformities of feet: Secondary | ICD-10-CM | POA: Diagnosis not present

## 2017-04-19 DIAGNOSIS — M216X2 Other acquired deformities of left foot: Secondary | ICD-10-CM | POA: Diagnosis not present

## 2017-04-26 DIAGNOSIS — K219 Gastro-esophageal reflux disease without esophagitis: Secondary | ICD-10-CM | POA: Diagnosis not present

## 2017-04-26 DIAGNOSIS — A09 Infectious gastroenteritis and colitis, unspecified: Secondary | ICD-10-CM | POA: Diagnosis not present

## 2017-04-30 ENCOUNTER — Other Ambulatory Visit: Payer: Self-pay | Admitting: Internal Medicine

## 2017-04-30 DIAGNOSIS — I2510 Atherosclerotic heart disease of native coronary artery without angina pectoris: Secondary | ICD-10-CM

## 2017-04-30 DIAGNOSIS — E785 Hyperlipidemia, unspecified: Secondary | ICD-10-CM

## 2017-05-01 DIAGNOSIS — Z23 Encounter for immunization: Secondary | ICD-10-CM | POA: Diagnosis not present

## 2017-05-01 NOTE — Telephone Encounter (Signed)
Please review for refill. Thanks!

## 2017-05-03 DIAGNOSIS — M24575 Contracture, left foot: Secondary | ICD-10-CM | POA: Diagnosis not present

## 2017-05-03 DIAGNOSIS — M2022 Hallux rigidus, left foot: Secondary | ICD-10-CM | POA: Diagnosis not present

## 2017-05-03 DIAGNOSIS — Q666 Other congenital valgus deformities of feet: Secondary | ICD-10-CM | POA: Diagnosis not present

## 2017-05-03 DIAGNOSIS — M216X2 Other acquired deformities of left foot: Secondary | ICD-10-CM | POA: Diagnosis not present

## 2017-05-03 DIAGNOSIS — M79672 Pain in left foot: Secondary | ICD-10-CM | POA: Diagnosis not present

## 2017-05-10 DIAGNOSIS — M069 Rheumatoid arthritis, unspecified: Secondary | ICD-10-CM | POA: Diagnosis not present

## 2017-05-17 DIAGNOSIS — M2012 Hallux valgus (acquired), left foot: Secondary | ICD-10-CM | POA: Diagnosis not present

## 2017-05-17 DIAGNOSIS — M216X2 Other acquired deformities of left foot: Secondary | ICD-10-CM | POA: Diagnosis not present

## 2017-05-17 DIAGNOSIS — M19072 Primary osteoarthritis, left ankle and foot: Secondary | ICD-10-CM | POA: Diagnosis not present

## 2017-05-17 DIAGNOSIS — Q666 Other congenital valgus deformities of feet: Secondary | ICD-10-CM | POA: Diagnosis not present

## 2017-05-17 DIAGNOSIS — T8484XA Pain due to internal orthopedic prosthetic devices, implants and grafts, initial encounter: Secondary | ICD-10-CM | POA: Diagnosis not present

## 2017-05-31 DIAGNOSIS — M2022 Hallux rigidus, left foot: Secondary | ICD-10-CM | POA: Diagnosis not present

## 2017-05-31 DIAGNOSIS — M216X2 Other acquired deformities of left foot: Secondary | ICD-10-CM | POA: Diagnosis not present

## 2017-05-31 DIAGNOSIS — M79672 Pain in left foot: Secondary | ICD-10-CM | POA: Diagnosis not present

## 2017-06-19 ENCOUNTER — Other Ambulatory Visit: Payer: Self-pay | Admitting: Internal Medicine

## 2017-06-26 ENCOUNTER — Ambulatory Visit: Payer: Medicare Other | Admitting: Internal Medicine

## 2017-06-28 ENCOUNTER — Encounter: Payer: Self-pay | Admitting: Internal Medicine

## 2017-06-28 DIAGNOSIS — Q666 Other congenital valgus deformities of feet: Secondary | ICD-10-CM | POA: Diagnosis not present

## 2017-06-28 DIAGNOSIS — M2022 Hallux rigidus, left foot: Secondary | ICD-10-CM | POA: Diagnosis not present

## 2017-06-28 DIAGNOSIS — M19072 Primary osteoarthritis, left ankle and foot: Secondary | ICD-10-CM | POA: Diagnosis not present

## 2017-06-28 DIAGNOSIS — M79672 Pain in left foot: Secondary | ICD-10-CM | POA: Diagnosis not present

## 2017-06-29 DIAGNOSIS — T8130XA Disruption of wound, unspecified, initial encounter: Secondary | ICD-10-CM | POA: Diagnosis not present

## 2017-06-30 ENCOUNTER — Ambulatory Visit (INDEPENDENT_AMBULATORY_CARE_PROVIDER_SITE_OTHER): Payer: Medicare Other | Admitting: Internal Medicine

## 2017-06-30 ENCOUNTER — Encounter: Payer: Self-pay | Admitting: Internal Medicine

## 2017-06-30 VITALS — BP 126/78 | HR 93 | Ht 61.0 in | Wt 195.0 lb

## 2017-06-30 DIAGNOSIS — I2510 Atherosclerotic heart disease of native coronary artery without angina pectoris: Secondary | ICD-10-CM | POA: Diagnosis not present

## 2017-06-30 DIAGNOSIS — R0602 Shortness of breath: Secondary | ICD-10-CM | POA: Diagnosis not present

## 2017-06-30 DIAGNOSIS — I272 Pulmonary hypertension, unspecified: Secondary | ICD-10-CM

## 2017-06-30 DIAGNOSIS — G4733 Obstructive sleep apnea (adult) (pediatric): Secondary | ICD-10-CM | POA: Diagnosis not present

## 2017-06-30 DIAGNOSIS — E785 Hyperlipidemia, unspecified: Secondary | ICD-10-CM

## 2017-06-30 NOTE — Progress Notes (Signed)
Follow-up Outpatient Visit Date: 06/30/2017  Primary Care Provider: Leonard Downing, MD [place] [address]  Chief Complaint: Follow-up shortness of breath  HPI:  [name] is a 71 y.o. year-old female with history of asthmatic bronchitis, GERD, rheumatoid arthritis, CVA (small bilateral basal ganglia lacunar infarcts noted by brain MRI in 11/2012), prediabetes, irritable bowel syndrome, and allergic rhinitis, who presents for follow-up of shortness of breath. I last saw [name] in June, at which time her shortness of breath was gradually improving. Today, she notes minimal dyspnea. She continues to recover from surgery on her left foot this summer. This has been complicated by a wound infection requiring close follow-up at [facility]. She is ambulating with a walker due to this.  [name] denies chest pain, shortness of breath, palpitations, lightheadedness, and edema. She stopped taking aspirin a few weeks ago due to blood drainage from her left foot wound.Marland Kitchen  --------------------------------------------------------------------------------------------------  Cardiovascular History & Procedures: Cardiovascular Problems:  Shortness of breath  Non-obstructive coronary artery disease  Stroke  Risk Factors:  History of stroke, borderline diabetes, hyperlipidemia, obesity, and chronic steroid use  Cath/PCI:  L/RHC (09/16/16): LMCA normal. LAD with 50% midvessel stenosis. LCx normal. 20% proximal RCA. LVEDP 15. RA 13, RV 38/12, PA 43/23 (32), PCWP 16. Ventricular concordance noted. Fick CO/CI 6.4/3.7. PA sat 70%, RA sat 75%, Ao sat 94%. PVR 2.5 Wood Units.  CV Surgery:  None  EP Procedures and Devices:  None  Non-Invasive Evaluation(s):  Carotid Doppler (10/26/16): Heterogeneous plaque, bilaterally. Tortuous mid and distal ICA's, bilaterally. 1-39% bilateral ICA stenosis. Normal subclavian arteries, bilaterally. Patent vertebral arteries with antegrade  flow. Subcentimeter hypoechoic nodule in the thyroid.  Transthoracic echocardiogram (05/11/16): Normal LV size and in traction (EF 55-60%). Normal LV wall motion. Grade 1 diastolic. Normal RV size and function. Mild mitral valve thickening. Mild TR.  Pharmacologic myocardial perfusion stress test (05/11/16): Low risk study without ischemia or scar. LVEF 62%.  Bilateral lower extremity venous duplex (04/07/16): No evidence of DVT though left peroneal vein was suboptimally imaged.  Transthoracic echocardiogram (12/17/12): Normal LV size with abnormal septal motion and EF of 50%. Grade 1 diastolic dysfunction noted. Aortic sclerosis without stenosis or regurgitation evident. Trivial MR and TR without other significant valvular disease. Normal PA systolic and central venous pressures.  Bilateral lower extremity arterial duplex (12/05/12): Bilateral ABIs 1.2. No significant stenosis identified.  Recent CV Pertinent Labs: Lab Results  Component Value Date   CHOL 127 01/09/2017   HDL 53 01/09/2017   LDLCALC 38 01/09/2017   LDLDIRECT 53 01/09/2017   TRIG 181 (H) 01/09/2017   CHOLHDL 2.4 01/09/2017   INR 1.0 09/12/2016   K 4.3 01/09/2017   BUN 18 01/09/2017   CREATININE 0.92 01/09/2017   CREATININE 0.91 05/30/2016    Past medical and surgical history were reviewed and updated in EPIC.  Current Meds  Medication Sig  . acetaminophen (TYLENOL) 500 MG tablet Take 2 tablets by mouth every 6 (six) hours.  Marland Kitchen albuterol (PROVENTIL) (2.5 MG/3ML) 0.083% nebulizer solution UE 1 VIAL IN NEBULIZER EVERY 6 HOURS AS NEEDED FOR WHEEZING OR SHORTNESS OF BREATH  . ALPRAZolam (XANAX) 0.5 MG tablet Take 0.5 mg by mouth as needed.  [name] ELLIPTA 62.5-25 MCG/INH AEPB USE 1 INHALATION DAILY  . aspirin EC 81 MG tablet Take 81 mg by mouth daily.  . cetirizine (ZYRTEC) 10 MG tablet Take 10 mg by mouth daily.  . clidinium-chlordiazePOXIDE (LIBRAX) 5-2.5 MG capsule Take  1 capsule by mouth 2 (two) times daily as needed  (for IBS).  . colestipol (COLESTID) 1 G tablet Take 1 g by mouth 2 (two) times daily.   . [employer] D3 5000 units capsule Take 5,000 Units by mouth daily.  Marland Kitchen FLUoxetine (PROZAC) 20 MG capsule Take 20 mg by mouth daily.  . fluticasone (FLONASE) 50 MCG/ACT nasal spray Place 2 sprays into the nose daily.   . furosemide (LASIX) 40 MG tablet TAKE 1 TABLET BY MOUTH TWO  TIMES DAILY  . glycopyrrolate (ROBINUL) 1 MG tablet Take 1 mg by mouth 3 (three) times daily as needed (for IBS).   . Hydrocodone-Acetaminophen (VICODIN) 5-300 MG TABS Take 1 tablet by mouth every 6 (six) hours.  . hydroxychloroquine (PLAQUENIL) 200 MG tablet Take 600 mg by mouth daily.   . hydroxypropyl methylcellulose / hypromellose (ISOPTO TEARS / GONIOVISC) 2.5 % ophthalmic solution Place 1-2 drops into both eyes 3 (three) times daily as needed for dry eyes.  Marland Kitchen ipratropium (ATROVENT) 0.03 % nasal spray USE 1 TO 2 SPRAYS INTO BOTH NOSTRILS 2 (TWO) TIMES  DAILY.  Marland Kitchen leflunomide (ARAVA) 20 MG tablet Take 20 mg by mouth daily.   Marland Kitchen levofloxacin (LEVAQUIN) 500 MG tablet Take 500 mg by mouth daily. X 14 days  . Melatonin 10 MG TABS Take 10 mg by mouth at bedtime.  . mirtazapine (REMERON) 45 MG tablet Take 45 mg by mouth at bedtime.  . montelukast (SINGULAIR) 10 MG tablet TAKE 1 TABLET BY MOUTH AT  BEDTIME  . Multiple Vitamin (MULTIVITAMIN) tablet Take 1 tablet by mouth daily.  Marland Kitchen omeprazole (PRILOSEC) 40 MG capsule Take 40 mg by mouth daily.  . potassium chloride SA (KLOR-CON M20) 20 MEQ tablet Take 1 tablet (20 mEq total) by mouth 3 (three) times daily.  . rosuvastatin (CRESTOR) 20 MG tablet TAKE 1 TABLET BY MOUTH  DAILY  . traMADol (ULTRAM) 50 MG tablet Take 50 mg by mouth 2 (two) times daily.   . verapamil (CALAN-SR) 240 MG CR tablet Take 240 mg by mouth daily.    Allergies: Erythromycin base; Lactose intolerance (gi); Lactulose; Mesalamine; Nitrofuran derivatives; Nitrofurantoin; Other; Sulfa antibiotics; Tdap [diphth-acell  pertussis-tetanus]; and Tetanus toxoid, adsorbed  Social History   Socioeconomic History  . Marital status: Married    Spouse name: Not on file  . Number of children: 2  . Years of education: Not on file  . Highest education level: Not on file  Social Needs  . Financial resource strain: Not on file  . Food insecurity - worry: Not on file  . Food insecurity - inability: Not on file  . Transportation needs - medical: Not on file  . Transportation needs - non-medical: Not on file  Occupational History  . Not on file  Tobacco Use  . Smoking status: Never Smoker  . Smokeless tobacco: Never Used  . Tobacco comment: positive passive tobacco smoke exposure  Substance and Sexual Activity  . Alcohol use: Yes    Comment: glass of wine each night   . Drug use: No  . Sexual activity: Yes  Other Topics Concern  . Not on file  Social History Narrative   Exercise - at least 2 times weekly   Caffeine - 2 cups in the morning          Family History  Problem Relation Age of Onset  . Heart disease Mother   . Arthritis Mother   . Heart attack Father   . Diabetes Other  sibling  . Heart attack Other        sibling    Review of Systems: A 12-system review of systems was performed and was negative except as noted in the HPI.  --------------------------------------------------------------------------------------------------  Physical Exam: BP 126/78   Pulse 93   Ht _0  (1.549 m)   Wt 195 lb (88.5 kg)   SpO2 93%   BMI 36.84 kg/m   General:  Obese woman, seated comfortably in the exam room. HEENT: No conjunctival pallor or scleral icterus. Moist mucous membranes.  OP clear. Neck: Supple without lymphadenopathy, thyromegaly, JVD, or HJR. Lungs: Normal work of breathing. Mildly diminished breath sounds throughout without wheezes or crackles. Heart: Regular rate and rhythm without murmurs, rubs, or gallops. Non-displaced PMI. Abd: Bowel sounds present. Soft, NT/ND without  hepatosplenomegaly Ext: No right lower extremity edema. Rigid boot is noted on the left leg. Skin: Warm and dry without rash.  Lab Results  Component Value Date   WBC 9.2 09/23/2016   HGB 14.4 09/23/2016   HCT 44.7 09/23/2016   MCV 88 09/23/2016   PLT 342 09/23/2016    Lab Results  Component Value Date   NA 142 01/09/2017   K 4.3 01/09/2017   CL 105 01/09/2017   CO2 23 01/09/2017   BUN 18 01/09/2017   CREATININE 0.92 01/09/2017   GLUCOSE 86 01/09/2017   ALT 23 01/09/2017    Lab Results  Component Value Date   CHOL 127 01/09/2017   HDL 53 01/09/2017   LDLCALC 38 01/09/2017   LDLDIRECT 53 01/09/2017   TRIG 181 (H) 01/09/2017   CHOLHDL 2.4 01/09/2017    --------------------------------------------------------------------------------------------------  ASSESSMENT AND PLAN: Shortness of breath Significantly improved since our last visit. We will continue her current diuretic regimen for element of pulmonary hypertension. Continue f/u with Dr. Annamaria Boots.  Coronary artery disease Non-obstructive CAD noted by cath earlier this year. [name] does not have any symptoms to suggest worsening coronary insufficiency. She should continue statin therapy and restart low-dose aspirin when as her left foot wound allows.  Hyperlipidemia LDL at goal. Continue rosuvastatin.  Pulmonary hypertension [name] appears euvolemic. Functional capacity limited primarily by her left foot surgery and subsequent infection. Continue her current medications.  Obstructive sleep apnea Continue CPAP and follow-up with Dr. Annamaria Boots.  Follow-up: Return to clinic in 6 months.  Nelva Bush, MD 07/01/2017 9:30 PM

## 2017-06-30 NOTE — Patient Instructions (Signed)
Medication Instructions:  Your physician recommends that you continue on your current medications as directed. Please refer to the Current Medication list given to you today.   Labwork: None   Testing/Procedures: None   Follow-Up: Your physician wants you to follow-up in: 6 months with Dr End. (June 2019).  You will receive a reminder letter in the mail two months in advance. If you don't receive a letter, please call our office to schedule the follow-up appointment.        If you need a refill on your cardiac medications before your next appointment, please call your pharmacy.

## 2017-07-01 ENCOUNTER — Encounter: Payer: Self-pay | Admitting: Internal Medicine

## 2017-07-04 DIAGNOSIS — S91302A Unspecified open wound, left foot, initial encounter: Secondary | ICD-10-CM | POA: Diagnosis not present

## 2017-07-04 DIAGNOSIS — T8130XA Disruption of wound, unspecified, initial encounter: Secondary | ICD-10-CM | POA: Diagnosis not present

## 2017-07-06 DIAGNOSIS — M150 Primary generalized (osteo)arthritis: Secondary | ICD-10-CM | POA: Diagnosis not present

## 2017-07-06 DIAGNOSIS — M2550 Pain in unspecified joint: Secondary | ICD-10-CM | POA: Diagnosis not present

## 2017-07-06 DIAGNOSIS — Z79899 Other long term (current) drug therapy: Secondary | ICD-10-CM | POA: Diagnosis not present

## 2017-07-06 DIAGNOSIS — M0609 Rheumatoid arthritis without rheumatoid factor, multiple sites: Secondary | ICD-10-CM | POA: Diagnosis not present

## 2017-07-06 DIAGNOSIS — J849 Interstitial pulmonary disease, unspecified: Secondary | ICD-10-CM | POA: Diagnosis not present

## 2017-07-06 DIAGNOSIS — Z6837 Body mass index (BMI) 37.0-37.9, adult: Secondary | ICD-10-CM | POA: Diagnosis not present

## 2017-07-06 DIAGNOSIS — E669 Obesity, unspecified: Secondary | ICD-10-CM | POA: Diagnosis not present

## 2017-07-12 DIAGNOSIS — S93145D Subluxation of metatarsophalangeal joint of left lesser toe(s), subsequent encounter: Secondary | ICD-10-CM | POA: Diagnosis not present

## 2017-07-12 DIAGNOSIS — M24575 Contracture, left foot: Secondary | ICD-10-CM | POA: Diagnosis not present

## 2017-07-12 DIAGNOSIS — M2012 Hallux valgus (acquired), left foot: Secondary | ICD-10-CM | POA: Diagnosis not present

## 2017-07-12 DIAGNOSIS — S93135D Subluxation of interphalangeal joint of left lesser toe(s), subsequent encounter: Secondary | ICD-10-CM | POA: Diagnosis not present

## 2017-07-12 DIAGNOSIS — Q666 Other congenital valgus deformities of feet: Secondary | ICD-10-CM | POA: Diagnosis not present

## 2017-07-12 DIAGNOSIS — M19072 Primary osteoarthritis, left ankle and foot: Secondary | ICD-10-CM | POA: Diagnosis not present

## 2017-07-12 DIAGNOSIS — M2022 Hallux rigidus, left foot: Secondary | ICD-10-CM | POA: Diagnosis not present

## 2017-07-12 DIAGNOSIS — M2142 Flat foot [pes planus] (acquired), left foot: Secondary | ICD-10-CM | POA: Diagnosis not present

## 2017-07-12 DIAGNOSIS — T8130XA Disruption of wound, unspecified, initial encounter: Secondary | ICD-10-CM | POA: Diagnosis not present

## 2017-07-12 DIAGNOSIS — M216X2 Other acquired deformities of left foot: Secondary | ICD-10-CM | POA: Diagnosis not present

## 2017-07-31 ENCOUNTER — Other Ambulatory Visit: Payer: Self-pay | Admitting: Acute Care

## 2017-08-11 ENCOUNTER — Telehealth: Payer: Self-pay | Admitting: Internal Medicine

## 2017-08-11 MED ORDER — AZITHROMYCIN 250 MG PO TABS: 250.0000 mg | ORAL_TABLET | Freq: Once | ORAL | 0 refills | Status: AC

## 2017-08-11 MED ORDER — BENZONATATE 200 MG PO CAPS: 200.0000 mg | ORAL_CAPSULE | Freq: Three times a day (TID) | ORAL | 0 refills | Status: DC | PRN

## 2017-08-11 NOTE — Telephone Encounter (Signed)
MW please advise, as CY is unavailable. Thanks.

## 2017-08-11 NOTE — Telephone Encounter (Signed)
Pt is requesting Rx for Tessalon. Pt reports of prod cough with dark green mucus & wheezing x10d. Pt denies fever, chills, sweats or body aches.   CY please advise. Thanks.

## 2017-08-11 NOTE — Telephone Encounter (Signed)
zpak  (not really allergic to erythomycin but if declines zpak : doxy 100 bid x 7 days Ok to refill tessalon or if new:  200 mg tid prn #30

## 2017-08-11 NOTE — Telephone Encounter (Signed)
Called and spoke with patient  Advised of MW recommendations Pt verbalized understanding Placed RX orders for ZPAK, and tessalon 221m tid prn #30 no refills Nothing further needed

## 2017-08-17 ENCOUNTER — Other Ambulatory Visit: Payer: Self-pay | Admitting: Internal Medicine

## 2017-08-29 ENCOUNTER — Other Ambulatory Visit: Payer: Self-pay | Admitting: Internal Medicine

## 2017-09-06 ENCOUNTER — Telehealth: Payer: Self-pay | Admitting: Internal Medicine

## 2017-09-06 MED ORDER — AMOXICILLIN 500 MG PO TABS: 500.0000 mg | ORAL_TABLET | Freq: Two times a day (BID) | ORAL | 0 refills | Status: DC

## 2017-09-06 NOTE — Telephone Encounter (Signed)
Pt reports of prod cough with green mucus, nasal drainage green in color, low grade fever of 99 x3d. Per CY verbally- offer Amoicillin 500 # 20 one tablet bid.  Rx has been sent to preferred pharmacy. Pt is aware and voiced her understanding. Nothing further is needed.

## 2017-09-10 ENCOUNTER — Other Ambulatory Visit: Payer: Self-pay | Admitting: Internal Medicine

## 2017-09-10 DIAGNOSIS — E785 Hyperlipidemia, unspecified: Secondary | ICD-10-CM

## 2017-09-10 DIAGNOSIS — I2510 Atherosclerotic heart disease of native coronary artery without angina pectoris: Secondary | ICD-10-CM

## 2017-09-11 NOTE — Telephone Encounter (Signed)
Please review for refill, Thanks !

## 2017-09-24 ENCOUNTER — Other Ambulatory Visit: Payer: Self-pay | Admitting: Internal Medicine

## 2017-09-26 ENCOUNTER — Other Ambulatory Visit: Payer: Self-pay | Admitting: Internal Medicine

## 2017-09-28 ENCOUNTER — Ambulatory Visit (INDEPENDENT_AMBULATORY_CARE_PROVIDER_SITE_OTHER): Payer: Medicare Other | Admitting: Internal Medicine

## 2017-09-28 ENCOUNTER — Encounter: Payer: Self-pay | Admitting: Internal Medicine

## 2017-09-28 VITALS — BP 130/82 | HR 102 | Ht 61.0 in | Wt 192.6 lb

## 2017-09-28 DIAGNOSIS — I2510 Atherosclerotic heart disease of native coronary artery without angina pectoris: Secondary | ICD-10-CM | POA: Diagnosis not present

## 2017-09-28 DIAGNOSIS — J441 Chronic obstructive pulmonary disease with (acute) exacerbation: Secondary | ICD-10-CM

## 2017-09-28 DIAGNOSIS — J849 Interstitial pulmonary disease, unspecified: Secondary | ICD-10-CM | POA: Diagnosis not present

## 2017-09-28 DIAGNOSIS — G4733 Obstructive sleep apnea (adult) (pediatric): Secondary | ICD-10-CM | POA: Diagnosis not present

## 2017-09-28 MED ORDER — FLUCONAZOLE 150 MG PO TABS: 150.0000 mg | ORAL_TABLET | Freq: Every day | ORAL | 3 refills | Status: DC

## 2017-09-28 MED ORDER — CEFDINIR 300 MG PO CAPS: 300.0000 mg | ORAL_CAPSULE | Freq: Two times a day (BID) | ORAL | 1 refills | Status: DC

## 2017-09-28 NOTE — Patient Instructions (Addendum)
Script sent for Omnicef  Antibiotic and Diflucan  We can continue CPAP 5-15  We can keep the April appointment

## 2017-09-28 NOTE — Progress Notes (Signed)
Subjective:    [name], female    DOB: [birth year], 72 y.o.   MRN: [id number]  HPI F never smoker followed for allergic rhinitis, rhinosinusitis, asthmatic bronchitis, ILD, complicated by , GERD, rheumatoid arthritis, ASCVD PFT 09/03/2015-mild obstructive airways disease, minimal restriction, mild diffusion defect. FVC 2.17/83%, FEV1 1.80/92%, ratio 0.83. No response to bronchodilator. TLC 78%, DLCO 73%.  CT chest High Resolution 03/14/2016 1. Mild basilar predominant subpleural reticulation and traction bronchiolectasis, grossly stable, possibly due to nonspecific interstitial pneumonitis. Usual interstitial pneumonitis is not Excluded. Nuclear stress test 05/20/2016-no evidence of significant blockage Echocardiogram 05/11/2016- WNL EF 55-60% Respiratory Viral Panel 08/01/16- POSITIVE Influenza A H3 Unattended Home Sleep Test 08/12/2016-AHI 18.2/hour, desaturation to 75% with average 88%, body weight 180 pounds CPAP titration sleep study 11/09/16.-6 cwp-  Needed supplemental oxygen because of sustained low saturation with 17.5 minutes recording saturation less than 88% on CPAP Office Spirometry 02/21/17-moderate restriction consistent with body habitus. FVC 1.70/66%, FEV1 1.49/77%, ratio 0.87, FEF 25-75% 2.39/146%. --------------------------------------------------------  02/21/17- 72 year old female never smoker followed for allergic rhinitis, rhinosinusitis, asthmatic bronchitis, ILD, OSA,complicated by GERD/Crohn's, Rheumatoid arthritis/prednisone CPAP auto 5-15/Advanced                 down load 01/03/17-100% compliance, AHI 3.1/ hr. For pulmonary preoperative surgical clearance- Reconstructive surgery on left ankle. Scheduled for 02/28/17.  Nebulizer albuterol,  Anoro Her breathing has been comfortable with no recent infections and no routine cough or wheeze. She has not needed her nebulizer machine for rescue inhaler, and is satisfied using Anoro 1 puff daily. CPAP download:  Good compliance 90%/4 hours per night, averaging over 7 hours. AHI 1.7/hour. She needs to work with her DME to get a more comfortable mask. The one she is using presses at her nose and the strap crauses occipital headaches. CXR 12/30/16  No active cardiopulmonary disease.  Aortic atherosclerosis. Office Spirometry 02/21/17-moderate restriction consistent with body habitus. FVC 1.70/66%, FEV1 1.49/77%, ratio 0.87, FEF 25-75% 2.39/146%.  // Will need f/u CT chest HR to reassess ? Interstitial disease after recovery from ankle surgery//  09/28/17- 72 year old female never smoker followed for allergic rhinitis, rhinosinusitis, asthmatic bronchitis, ILD, OSA,complicated by GERD/Crohn's, Rheumatoid arthritis/prednisone CPAP auto 5-15/Advanced   ----Pt has had cough with wheezing and SOB for past 1 month. Pt is coughing up green phlegm. 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  Now for the past month has had a bronchitis pattern with green productive cough.  Nasal congestion.  Says airway infections flare after antibiotics are stopped.  Last amoxicillin 3 weeks ago. CPAP prevents snoring and she sleeps well with it-confirmed by her husband.  ROS-see HPI   + = positive Constitutional:     weight loss, night sweats, fevers, chills, fatigue, lassitude. HEENT:   No-  headaches, difficulty swallowing, tooth/dental problems, sore throat,       No-  sneezing, itching, ear ache,  nasal congestion, +post nasal drip,  CV:  No-   chest pain, orthopnea, PND, swelling in lower extremities, anasarca,                                                      dizziness, palpitations Resp: +  shortness of breath with exertion or at rest.               +  productive cough, non-productive cough,  No- coughing up of blood.             + change in color of mucus.  +- wheezing.   Skin: No-   rash or lesions. GI:  No-   heartburn, indigestion, abdominal pain, nausea,  vomiting,  GU:  MS: +  joint pain or swelling.   Neuro-     nothing unusual Psych:  No- change in mood or affect. No depression or anxiety.  No memory loss.  Objective:  OBJ- Physical Exam  General- Alert, Oriented, Affect-appropriate, Distress- none acute, + Obese, Skin- + stasis changes  lower legs Lymphadenopathy- none Head- atraumatic            Eyes- Gross vision intact, PERRLA, conjunctivae and secretions clear            Ears- Hearing, canals-normal            Nose- Clear, no-Septal dev, mucus, polyps, erosion, perforation             Throat- Mallampati II , mucosa-clear, drainage- none, tonsils-                                               atrophic, voice normal,  Neck- flexible , trachea midline, no stridor , thyroid nl, carotid no bruit Chest - symmetrical excursion , unlabored           Heart/CV- RRR , no murmur , no gallop  , no rub, nl s1 s2                           - JVD- none , edema- none, stasis changes+, varices- none           Lung-    cough +, unlabored, wheeze-none,               Chest wall-  Abd-  Br/ Gen/ Rectal- Not done, not indicated Extrem- +scar from treated wound, + left foot in boot Neuro- grossly intact to observation

## 2017-09-29 ENCOUNTER — Telehealth: Payer: Self-pay | Admitting: Internal Medicine

## 2017-09-29 NOTE — Telephone Encounter (Signed)
Spoke with pt. She is aware of CY's recommendations. Nothing further was needed.

## 2017-09-29 NOTE — Telephone Encounter (Signed)
Called and spoke with patient she states that the medication prescribed to her yesterday (cefdinir) has caused severe diarrhea. CY please advise on this patient is worried to continue medication.   Current Outpatient Medications on File Prior to Visit  Medication Sig Dispense Refill  . acetaminophen (TYLENOL) 500 MG tablet Take 2 tablets by mouth every 6 (six) hours.    Marland Kitchen albuterol (PROVENTIL) (2.5 MG/3ML) 0.083% nebulizer solution UE 1 VIAL IN NEBULIZER EVERY 6 HOURS AS NEEDED FOR WHEEZING OR SHORTNESS OF BREATH 75 mL 5  . ALPRAZolam (XANAX) 0.5 MG tablet Take 0.5 mg by mouth as needed.    [name] ELLIPTA 62.5-25 MCG/INH AEPB USE 1 INHALATION DAILY 180 each 2  . aspirin EC 81 MG tablet Take 81 mg by mouth daily.    . cefdinir (OMNICEF) 300 MG capsule Take 1 capsule (300 mg total) by mouth 2 (two) times daily. 28 capsule 1  . cetirizine (ZYRTEC) 10 MG tablet Take 10 mg by mouth daily.    . clidinium-chlordiazePOXIDE (LIBRAX) 5-2.5 MG capsule Take 1 capsule by mouth 2 (two) times daily as needed (for IBS).    . colestipol (COLESTID) 1 G tablet Take 1 g by mouth 2 (two) times daily.     . [facility] D3 5000 units capsule Take 5,000 Units by mouth daily.  2  . fluconazole (DIFLUCAN) 150 MG tablet Take 1 tablet (150 mg total) by mouth daily. 10 tablet 3  . FLUoxetine (PROZAC) 20 MG capsule Take 20 mg by mouth daily.    . fluticasone (FLONASE) 50 MCG/ACT nasal spray Place 2 sprays into the nose daily.     . furosemide (LASIX) 40 MG tablet TAKE 1 TABLET BY MOUTH TWO  TIMES DAILY 180 tablet 1  . glycopyrrolate (ROBINUL) 1 MG tablet Take 1 mg by mouth 3 (three) times daily as needed (for IBS).     . Hydrocodone-Acetaminophen (VICODIN) 5-300 MG TABS Take 1 tablet by mouth every 6 (six) hours.    . hydroxychloroquine (PLAQUENIL) 200 MG tablet Take 600 mg by mouth daily.     . hydroxypropyl methylcellulose / hypromellose (ISOPTO TEARS / GONIOVISC) 2.5 % ophthalmic solution Place 1-2 drops into both eyes 3 (three)  times daily as needed for dry eyes.    Marland Kitchen ipratropium (ATROVENT) 0.03 % nasal spray USE 1 TO 2 SPRAYS INTO BOTH NOSTRILS 2 TIMES DAILY. 90 mL 0  . leflunomide (ARAVA) 20 MG tablet Take 20 mg by mouth daily.     . Melatonin 10 MG TABS Take 10 mg by mouth at bedtime.    . mirtazapine (REMERON) 45 MG tablet Take 45 mg by mouth at bedtime.    . montelukast (SINGULAIR) 10 MG tablet TAKE 1 TABLET BY MOUTH AT  BEDTIME 90 tablet 0  . Multiple Vitamin (MULTIVITAMIN) tablet Take 1 tablet by mouth daily.    Marland Kitchen omeprazole (PRILOSEC) 40 MG capsule Take 40 mg by mouth daily.    . potassium chloride SA (K-DUR,KLOR-CON) 20 MEQ tablet TAKE 1 TABLET BY MOUTH 3  TIMES DAILY 270 tablet 1  . rosuvastatin (CRESTOR) 20 MG tablet TAKE 1 TABLET BY MOUTH  DAILY 90 tablet 1  . traMADol (ULTRAM) 50 MG tablet Take 50 mg by mouth 2 (two) times daily.     . verapamil (CALAN-SR) 240 MG CR tablet Take 240 mg by mouth daily.     No current facility-administered medications on file prior to visit.      Allergies  Allergen Reactions  .  Erythromycin Base Diarrhea    Other  . Lactose Intolerance (Gi) Diarrhea  . Lactulose Diarrhea  . Mesalamine Nausea Only  . Nitrofuran Derivatives     shakiness  . Nitrofurantoin Other (See Comments)    shakiness other  . Other Diarrhea and Other (See Comments)    Shaking uncontrollably "lettuce only" "lettuce only"  . Sulfa Antibiotics Other (See Comments)    Patient can't recall reaction   . Tdap [Diphth-Acell Pertussis-Tetanus]     Shaking uncontrollably  . Tetanus Toxoid, Adsorbed Other (See Comments)    Shaking uncontrollable

## 2017-09-29 NOTE — Telephone Encounter (Signed)
Certainly sorry. Stop Cefdinir. Ok to use PeptoBismol or Kaopectate if needed this weekend, and see how she is feeling next week.

## 2017-10-02 ENCOUNTER — Telehealth: Payer: Self-pay | Admitting: Internal Medicine

## 2017-10-02 NOTE — Telephone Encounter (Signed)
Patient aware nothing further needed at this time.

## 2017-10-02 NOTE — Telephone Encounter (Signed)
I suggest she stay off antibiotics. Hopefully she will do well as warmer weather comes in.

## 2017-10-02 NOTE — Telephone Encounter (Signed)
Called and spoke with patient she states that she did stop the cefdinir and is feeling much better. She took imodium over the weekend and the diarrhea has stopped. Patient is experiencing some congestion but not much. CY please advise if patient needs to do anything else, thanks.

## 2017-10-04 NOTE — Assessment & Plan Note (Signed)
She is slow to clear.  We discussed options and agreed to give a prolonged course of cephalosporin

## 2017-10-04 NOTE — Assessment & Plan Note (Signed)
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

## 2017-10-04 NOTE — Assessment & Plan Note (Signed)
Good compliance and control with CPAP verified by her husband.  She sleeps better with it.

## 2017-11-10 ENCOUNTER — Ambulatory Visit: Payer: Medicare Other | Admitting: Internal Medicine

## 2017-11-16 ENCOUNTER — Other Ambulatory Visit: Payer: Self-pay | Admitting: Internal Medicine

## 2017-12-07 ENCOUNTER — Other Ambulatory Visit: Payer: Self-pay | Admitting: Internal Medicine

## 2017-12-08 NOTE — Telephone Encounter (Signed)
Ok to refill benzonatate 200 mg, # 30, 1 every 8 hours as needed for cough, ref x 1

## 2017-12-12 ENCOUNTER — Encounter: Payer: Self-pay | Admitting: Pulmonary Disease

## 2017-12-12 ENCOUNTER — Telehealth: Payer: Self-pay | Admitting: Internal Medicine

## 2017-12-12 ENCOUNTER — Ambulatory Visit (INDEPENDENT_AMBULATORY_CARE_PROVIDER_SITE_OTHER)
Admission: RE | Admit: 2017-12-12 | Discharge: 2017-12-12 | Disposition: A | Discharge status: Home / Self Care | Payer: Medicare Other | Source: Ambulatory Visit | Attending: Pulmonary Disease | Admitting: Pulmonary Disease

## 2017-12-12 ENCOUNTER — Ambulatory Visit (INDEPENDENT_AMBULATORY_CARE_PROVIDER_SITE_OTHER): Payer: Medicare Other | Admitting: Pulmonary Disease

## 2017-12-12 VITALS — Temp 101.3°F | Ht 61.0 in | Wt 197.4 lb

## 2017-12-12 DIAGNOSIS — J189 Pneumonia, unspecified organism: Secondary | ICD-10-CM

## 2017-12-12 DIAGNOSIS — R0602 Shortness of breath: Secondary | ICD-10-CM

## 2017-12-12 DIAGNOSIS — G4733 Obstructive sleep apnea (adult) (pediatric): Secondary | ICD-10-CM

## 2017-12-12 MED ORDER — PREDNISONE 10 MG PO TABS: ORAL_TABLET | ORAL | 0 refills | Status: DC

## 2017-12-12 MED ORDER — LEVOFLOXACIN 500 MG PO TABS: 500.0000 mg | ORAL_TABLET | Freq: Every day | ORAL | 0 refills | Status: DC

## 2017-12-12 MED ORDER — PROMETHAZINE-CODEINE 6.25-10 MG/5ML PO SYRP: 5.0000 mL | ORAL_SOLUTION | Freq: Three times a day (TID) | ORAL | 0 refills | Status: AC | PRN

## 2017-12-12 NOTE — Patient Instructions (Signed)
Chest x-ray today Levaquin 500 mg for 7 days Prednisone >>>4 tabs for 2 days, then 3 tabs for 2 days, 2 tabs for 2 days, then 1 tab for 2 days, then stop  Continue saline rinses  Continue CPAP use  Continue inhaler use   Continue over-the-counter medications such as Mucinex dm Continue Tessalon Perles  Phenergan cough medicine prescription given today     Please contact the office if your symptoms worsen or you have concerns that you are not improving.   Thank you for choosing [facility] for your healthcare, and for allowing [place] to partner with you on your healthcare journey. I am thankful to be able to provide care to you today.   Wyn Quaker FNP-C

## 2017-12-12 NOTE — Assessment & Plan Note (Signed)
Chest x-ray today Levaquin 500 mg for 7 days Prednisone >>>4 tabs for 2 days, then 3 tabs for 2 days, 2 tabs for 2 days, then 1 tab for 2 days, then stop  Continue saline rinses  Continue inhaler use   Continue over-the-counter medications such as Mucinex dm Continue Tessalon Perles  Phenergan cough medicine prescription given today

## 2017-12-12 NOTE — Progress Notes (Signed)
Your chest x-ray results have come back.  Showing a potential early pneumonia.  We will continue the treatment as discussed today.  There are no changes.  We will do a repeat chest x-ray at your 01/19/2018 appointment with Dr. Annamaria Boots to ensure this is improving after the antibiotic and prednisone.   Wyn Quaker FNP

## 2017-12-12 NOTE — Progress Notes (Signed)
_0  ID: [name], female    DOB: [birth year], 72 y.o.   MRN: [id number]  Chief Complaint  Patient presents with  . Acute Visit    cough started thursday and had her up all night, increased SOB, nasal drainage, and chest pressure. Taking mucinex dm, tylenol, and phenergan with codeine,and unable to use cpap.     Referring provider: Leonard Downing, *  HPI: 72 year old female never smoker followed for allergic rhinitis, rhinosinusitis, asthmatic bronchitis, interstitial lung disease, Past medical history: GERD, rheumatoid arthritis, ASCVD  Tests:  PFT 09/03/2015-mild obstructive airways disease, minimal restriction, mild diffusion defect. FVC 2.17/83%, FEV1 1.80/92%, ratio 0.83. No response to bronchodilator. TLC 78%, DLCO 73%. Office Spirometry 02/21/17-moderate restriction consistent with body habitus. FVC 1.70/66%, FEV1 1.49/77%, ratio 0.87, FEF 25-75% 2.39/146%.  Unattended Home Sleep Test 08/12/2016-AHI 18.2/hour, desaturation to 75% with average 88%, body weight 180 pounds CPAP titration sleep study 11/09/16.-6 cwp-  Needed supplemental oxygen because of sustained low saturation with 17.5 minutes recording saturation less than 88% on CPAP  Imaging:   12/30/2016-chest x-ray- no active cardiopulmonary disease 03/14/2016-CT chest high-res- mild basilar predominant subpleural reticulation and traction bronchiolectasis, scattered tiny pulmonary nodules measuring 4 mm or less  Cardiac:  Nuclear stress test 05/20/2016-no evidence of significant blockage Echocardiogram 05/11/2016- WNL EF 83-15%, grade 1 diastolic dysfunction  Micro: 09/07/2016-sputum culture-abundant gram-positive cocci in pairs and clusters, scant growth of yeast   Labs:  Respiratory Viral Panel 08/01/16- POSITIVE Influenza A H3   12/12/17 Acute  Reporting the office today.  With 1 week of symptoms.  Patient reporting they have not been able to use their CPAP in the last 2 to 3 days.  Husband and patient  both reporting that patient has had a decreased appetite.  As well as a productive cough with light green/to yellow mucus.  Patient says that she is also been having fever as well as body aches.  Over the weekend patient decided she was not feeling well so the decided to take to doses of Ceftin ear which she had lying around her house.  This resulted in diarrhea as it had in the past which is why is on the allergy list.  Patient reporting she has been using over-the-counter regimen such as Mucinex DM, her Ladona Ridgel, as well as a old prescription of Phenergan cough syrup which helped her get some sleep last night.  Patient is requesting a refill of the Phenergan cough medicine.  Patient and husband both have concerns that patient may have pneumonia which is why they presented today for an appointment.   Allergies  Allergen Reactions  . Cefdinir Diarrhea  . Erythromycin Base Diarrhea    Other  . Lactose Intolerance (Gi) Diarrhea  . Lactulose Diarrhea  . Mesalamine Nausea Only  . Nitrofuran Derivatives     shakiness  . Nitrofurantoin Other (See Comments)    shakiness other  . Other Diarrhea and Other (See Comments)    Shaking uncontrollably "lettuce only" "lettuce only"  . Sulfa Antibiotics Other (See Comments)    Patient can't recall reaction   . Tdap [Tetanus-Diphth-Acell Pertussis]     Shaking uncontrollably  . Tetanus Toxoid, Adsorbed Other (See Comments)    Shaking uncontrollable     Immunization History  Administered Date(s) Administered  . Influenza Split 03/18/2012, 03/18/2013, 04/14/2014, 04/17/2017  . Influenza Whole 04/30/2008, 04/20/2009, 04/18/2011  . Influenza, High Dose Seasonal PF 04/23/2015, 03/24/2016  . Pneumococcal Conjugate-13 11/11/2013    Past Medical History:  Diagnosis  Date  . Acute asthmatic bronchitis   . Allergic rhinitis   . Esophageal reflux   . Irritable bowel syndrome   . Rheumatoid arthritis(714.0)     Tobacco History: Social History     Tobacco Use  Smoking Status Never Smoker  Smokeless Tobacco Never Used  Tobacco Comment   positive passive tobacco smoke exposure   Counseling given: Yes Comment: positive passive tobacco smoke exposure   Outpatient Encounter Medications as of 12/12/2017  Medication Sig  . acetaminophen (TYLENOL) 500 MG tablet Take 2 tablets by mouth every 6 (six) hours.  Marland Kitchen albuterol (PROVENTIL) (2.5 MG/3ML) 0.083% nebulizer solution UE 1 VIAL IN NEBULIZER EVERY 6 HOURS AS NEEDED FOR WHEEZING OR SHORTNESS OF BREATH  . ALPRAZolam (XANAX) 0.5 MG tablet Take 0.5 mg by mouth as needed.  [name] ELLIPTA 62.5-25 MCG/INH AEPB USE 1 INHALATION DAILY  . aspirin EC 81 MG tablet Take 81 mg by mouth daily.  . benzonatate (TESSALON) 200 MG capsule TAKE 1 CAPSULE BY MOUTH 3 TIMES A DAY AS NEEDED FOR COUGH  . cetirizine (ZYRTEC) 10 MG tablet Take 10 mg by mouth daily.  . clidinium-chlordiazePOXIDE (LIBRAX) 5-2.5 MG capsule Take 1 capsule by mouth 2 (two) times daily as needed (for IBS).  . colestipol (COLESTID) 1 G tablet Take 1 g by mouth 2 (two) times daily.   . [facility] D3 5000 units capsule Take 5,000 Units by mouth daily.  . fluconazole (DIFLUCAN) 150 MG tablet Take 1 tablet (150 mg total) by mouth daily.  Marland Kitchen FLUoxetine (PROZAC) 20 MG capsule Take 20 mg by mouth daily.  . fluticasone (FLONASE) 50 MCG/ACT nasal spray Place 2 sprays into the nose daily.   . furosemide (LASIX) 40 MG tablet TAKE 1 TABLET BY MOUTH TWO  TIMES DAILY  . glycopyrrolate (ROBINUL) 1 MG tablet Take 1 mg by mouth 3 (three) times daily as needed (for IBS).   . hydroxychloroquine (PLAQUENIL) 200 MG tablet Take 600 mg by mouth daily.   . hydroxypropyl methylcellulose / hypromellose (ISOPTO TEARS / GONIOVISC) 2.5 % ophthalmic solution Place 1-2 drops into both eyes 3 (three) times daily as needed for dry eyes.  Marland Kitchen ipratropium (ATROVENT) 0.03 % nasal spray USE 1 TO 2 SPRAYS INTO BOTH NOSTRILS 2 TIMES DAILY.  Marland Kitchen leflunomide (ARAVA) 20 MG tablet Take 20  mg by mouth daily.   . Melatonin 10 MG TABS Take 10 mg by mouth at bedtime.  . mirtazapine (REMERON) 45 MG tablet Take 45 mg by mouth at bedtime.  . montelukast (SINGULAIR) 10 MG tablet TAKE 1 TABLET BY MOUTH AT  BEDTIME  . Multiple Vitamin (MULTIVITAMIN) tablet Take 1 tablet by mouth daily.  Marland Kitchen omeprazole (PRILOSEC) 40 MG capsule Take 40 mg by mouth daily.  . potassium chloride SA (K-DUR,KLOR-CON) 20 MEQ tablet TAKE 1 TABLET BY MOUTH 3  TIMES DAILY  . rosuvastatin (CRESTOR) 20 MG tablet TAKE 1 TABLET BY MOUTH  DAILY  . traMADol (ULTRAM) 50 MG tablet Take 50 mg by mouth 2 (two) times daily.   . verapamil (CALAN-SR) 240 MG CR tablet Take 240 mg by mouth daily.  . cefdinir (OMNICEF) 300 MG capsule Take 1 capsule (300 mg total) by mouth 2 (two) times daily. (Patient not taking: Reported on 12/12/2017)  . Hydrocodone-Acetaminophen (VICODIN) 5-300 MG TABS Take 1 tablet by mouth every 6 (six) hours.  Marland Kitchen levofloxacin (LEVAQUIN) 500 MG tablet Take 1 tablet (500 mg total) by mouth daily.  . predniSONE (DELTASONE) 10 MG tablet 4 tabs  for 2 days, then 3 tabs for 2 days, 2 tabs for 2 days, then 1 tab for 2 days, then stop  . promethazine-codeine (PHENERGAN WITH CODEINE) 6.25-10 MG/5ML syrup Take 5 mLs by mouth every 8 (eight) hours as needed for up to 10 days for cough.   No facility-administered encounter medications on file as of 12/12/2017.      Review of Systems  Constitutional: +fatigue, fevers, body aches, chills  No  weight loss, night sweats HEENT: +headaches, post nasal drip  No Difficulty swallowing,  Tooth/dental problems, or  Sore throat, No sneezing, itching, ear ache CV: No chest pain,  orthopnea, PND, swelling in lower extremities, anasarca, dizziness, palpitations, syncope  GI: +decreased appetite No heartburn, indigestion, abdominal pain, nausea, vomiting, diarrhea, change in bowel habits, bloody stools Resp: +sob with exertion and rest, mucous, productive cough, trouble sleeping  No  coughing up of blood.  No change in color of mucus.  No wheezing.  No chest wall deformity Skin: no rash, lesions, no skin changes. GU: no dysuria, change in color of urine, no urgency or frequency.  No flank pain, no hematuria  MS:  No joint pain or swelling.  No decreased range of motion.  No back pain. Psych:  No change in mood or affect. No depression or anxiety.  No memory loss.   Physical Exam  Temp (!) 101.3 F (38.5 C) (Oral)   Ht _0  (1.549 m)   Wt 197 lb 6.4 oz (89.5 kg)   BMI 37.30 kg/m   GEN: A/Ox3; pleasant , NAD, well nourished, chronically ill    HEENT:  [place]/AT,  EACs-clear, TMs-wnl, NOSE-clear, THROAT- +post nasal drip  no lesions, sinuses non tender to palpation   NECK:  Supple w/ fair ROM; no JVD; normal carotid impulses w/o bruits; no thyromegaly or nodules palpated; no lymphadenopathy.    RESP: +expiratory wheezes and diminished in the bases no accessory muscle use, no dullness to percussion  CARD:  RRR, no m/r/g, no peripheral edema, pulses intact, no cyanosis or clubbing.  GI:   Soft & nt; nml bowel sounds; no organomegaly or masses detected.   Musco: Warm bil, no deformities or joint swelling noted.   Neuro: alert, no focal deficits noted.    Skin: Warm, no lesions or rashes    Lab Results:  CBC    Component Value Date/Time   WBC 9.2 09/23/2016 1252   WBC 11.5 (H) 01/21/2016 1129   RBC 5.08 09/23/2016 1252   RBC 4.77 01/21/2016 1129   HGB 14.4 09/23/2016 1252   HCT 44.7 09/23/2016 1252   PLT 342 09/23/2016 1252   MCV 88 09/23/2016 1252   MCH 28.3 09/23/2016 1252   MCH 29.6 08/03/2013 0548   MCHC 32.2 09/23/2016 1252   MCHC 33.2 01/21/2016 1129   RDW 15.4 09/23/2016 1252   LYMPHSABS 1.2 09/23/2016 1252   MONOABS 0.8 01/21/2016 1129   EOSABS 0.0 09/23/2016 1252   BASOSABS 0.0 09/23/2016 1252    BMET    Component Value Date/Time   NA 142 01/09/2017 1024   K 4.3 01/09/2017 1024   CL 105 01/09/2017 1024   CO2 23 01/09/2017 1024    GLUCOSE 86 01/09/2017 1024   GLUCOSE 72 05/30/2016 1002   BUN 18 01/09/2017 1024   CREATININE 0.92 01/09/2017 1024   CREATININE 0.91 05/30/2016 1002   CALCIUM 10.3 01/09/2017 1024   GFRNONAA 62 01/09/2017 1024   GFRAA 72 01/09/2017 1024    BNP No results found for:  BNP  ProBNP    Component Value Date/Time   PROBNP 20.0 01/21/2016 1129    Imaging: Dg Chest 2 View  Result Date: 12/12/2017 CLINICAL DATA:  Weakness, dyspnea, cough, chest congestion and wheezing for the past 5 days. History of asthmatic bronchitis, previous episodes of pneumonia, coronary artery disease, and pulmonary hypertension. EXAM: CHEST - 2 VIEW COMPARISON:  Chest x-ray of December 30, 2016 FINDINGS: The lungs are adequately inflated. There is patchy airspace opacity in the left mid lung. The interstitial markings of both lungs are increased. The cardiac silhouette is enlarged. The pulmonary vascularity is not engorged. There is calcification in the wall of the aortic arch. The bony thorax exhibits no acute abnormality. IMPRESSION: Chronic bronchitic changes. Patchy airspace opacity on the left may reflect early pneumonia. One cannot exclude low-grade CHF in the appropriate clinical setting. Followup PA and lateral chest X-ray is recommended in 3-4 weeks following trial of antibiotic therapy to ensure resolution and exclude underlying malignancy. Thoracic aortic atherosclerosis. Electronically Signed   By: David  Martinique M.D.   On: 12/12/2017 16:43     Assessment & Plan:   72 year old patient presenting today for shortness of breath/ potential CAP.  Will do chest x-ray today to rule out pneumonia.  Started on Levaquin, prednisone taper, Phenergan cough syrup prescription given, saline rinses as needed, continue inhaler regimen, continue Tessalon Perles.  Patient to manage temperature with Tylenol. Patient to keep follow-up with Dr. Annamaria Boots.   CAP (community acquired pneumonia) Chest x-ray today Levaquin 500 mg for 7  days Prednisone >>>4 tabs for 2 days, then 3 tabs for 2 days, 2 tabs for 2 days, then 1 tab for 2 days, then stop  Continue saline rinses  Continue inhaler use   Continue over-the-counter medications such as Mucinex dm Continue Tessalon Perles  Phenergan cough medicine prescription given today   Obstructive sleep apnea Restart CPAP use when able Follow-up with office if unable to use CPAP nightly   Shortness of breath Chest x-ray today Levaquin 500 mg for 7 days Prednisone >>>4 tabs for 2 days, then 3 tabs for 2 days, 2 tabs for 2 days, then 1 tab for 2 days, then stop  Continue saline rinses  Continue CPAP use  Continue inhaler use   Continue over-the-counter medications such as Mucinex dm Continue Tessalon Perles  Phenergan cough medicine prescription given today      Lauraine Rinne, NP 12/12/2017

## 2017-12-12 NOTE — Assessment & Plan Note (Signed)
Restart CPAP use when able Follow-up with office if unable to use CPAP nightly

## 2017-12-12 NOTE — Assessment & Plan Note (Signed)
Chest x-ray today Levaquin 500 mg for 7 days Prednisone >>>4 tabs for 2 days, then 3 tabs for 2 days, 2 tabs for 2 days, then 1 tab for 2 days, then stop  Continue saline rinses  Continue CPAP use  Continue inhaler use   Continue over-the-counter medications such as Mucinex dm Continue Tessalon Perles  Phenergan cough medicine prescription given today

## 2017-12-12 NOTE — Telephone Encounter (Signed)
Spoke with pt, I asked her if she would see A NP today since CY is not available. She states she would but it would but it had to be after 3 pm. I checked with [name] and she stated pt could come in at 3:30pm. Called pt and she agreed to come in. Nothing further is needed.

## 2017-12-20 ENCOUNTER — Telehealth: Payer: Self-pay | Admitting: Internal Medicine

## 2017-12-20 DIAGNOSIS — R0602 Shortness of breath: Secondary | ICD-10-CM

## 2017-12-20 MED ORDER — LEVOFLOXACIN 500 MG PO TABS: 500.0000 mg | ORAL_TABLET | Freq: Every day | ORAL | 0 refills | Status: DC

## 2017-12-20 NOTE — Telephone Encounter (Signed)
Pt c/o temp of 100.1, sinus congestion, chest congestion, prod cough with yellow mucus.   Pt saw [name] on 5/28 and was given Levaquin 558m X7 days, pred taper.  Pt has completed both of these meds, still has tessalon perles and phenergan cough syrup.  States her s/s have improved since this OV but are still present, requesting additional recs.   [facility] [address] advise on additional recs.  Thanks.

## 2017-12-20 NOTE — Telephone Encounter (Signed)
Pt aware of recs.  rx sent to preferred pharmacy.  Nothing further needed.  

## 2017-12-20 NOTE — Telephone Encounter (Signed)
Suggest we extend levaquin for another 5 days.

## 2018-01-18 ENCOUNTER — Other Ambulatory Visit: Payer: Self-pay | Admitting: Internal Medicine

## 2018-01-19 ENCOUNTER — Encounter: Payer: Self-pay | Admitting: Internal Medicine

## 2018-01-19 ENCOUNTER — Telehealth: Payer: Self-pay | Admitting: Internal Medicine

## 2018-01-19 ENCOUNTER — Ambulatory Visit (INDEPENDENT_AMBULATORY_CARE_PROVIDER_SITE_OTHER): Payer: Medicare Other | Admitting: Internal Medicine

## 2018-01-19 VITALS — BP 124/82 | HR 92 | Ht 61.0 in | Wt 198.2 lb

## 2018-01-19 DIAGNOSIS — J849 Interstitial pulmonary disease, unspecified: Secondary | ICD-10-CM | POA: Diagnosis not present

## 2018-01-19 DIAGNOSIS — I2510 Atherosclerotic heart disease of native coronary artery without angina pectoris: Secondary | ICD-10-CM

## 2018-01-19 DIAGNOSIS — G4733 Obstructive sleep apnea (adult) (pediatric): Secondary | ICD-10-CM

## 2018-01-19 DIAGNOSIS — J4540 Moderate persistent asthma, uncomplicated: Secondary | ICD-10-CM | POA: Diagnosis not present

## 2018-01-19 MED ORDER — IPRATROPIUM-ALBUTEROL 0.5-2.5 (3) MG/3ML IN SOLN: 3.0000 mL | Freq: Four times a day (QID) | RESPIRATORY_TRACT | 12 refills | Status: DC | PRN

## 2018-01-19 NOTE — Assessment & Plan Note (Signed)
She continues to benefit from CPAP.  Her husband describes her breathing pattern in a way that suggests obesity hypoventilation syndrome which would be quite consistent with her body habitus. Plan-continue CPAP auto 5-15

## 2018-01-19 NOTE — Assessment & Plan Note (Signed)
She does not have an acute exacerbation at this visit.  The wheeze and dyspnea she describes are exertional and primarily reflect her obesity and marked deconditioning.  She is strongly encouraged to continue with the physical therapy sessions and also to look for activities such as water aerobics or exercycle that she can do with her feet. Plan-prescription for DuoNeb neb solution with suggestion that she try a treatment of this before she goes to PT to see if it helps.

## 2018-01-19 NOTE — Patient Instructions (Addendum)
Script printed to get duoneb neb solution through DME.   Order- CT chest Hi Res  ILD protocol     Dx interstitial lung disease

## 2018-01-19 NOTE — Assessment & Plan Note (Signed)
Explained that we are watching to see if this is a progressive process. Plan-update high-res CT chest.  This will also address the patchy airspace opacity in the left lung noted on CXR 12/12/2017.

## 2018-01-19 NOTE — Progress Notes (Signed)
Subjective:    [name], female    DOB: 07-08-1946, 72 y.o.   MRN: [id number]  HPI F never smoker followed for allergic rhinitis, rhinosinusitis, asthmatic bronchitis, ILD, complicated by , GERD, rheumatoid arthritis, ASCVD PFT 09/03/2015-mild obstructive airways disease, minimal restriction, mild diffusion defect. FVC 2.17/83%, FEV1 1.80/92%, ratio 0.83. No response to bronchodilator. TLC 78%, DLCO 73%.  CT chest High Resolution 03/14/2016 1. Mild basilar predominant subpleural reticulation and traction bronchiolectasis, grossly stable, possibly due to nonspecific interstitial pneumonitis. Usual interstitial pneumonitis is not Excluded. Nuclear stress test 05/20/2016-no evidence of significant blockage Echocardiogram 05/11/2016- WNL EF 55-60% Respiratory Viral Panel 08/01/16- POSITIVE Influenza A H3 Unattended Home Sleep Test 08/12/2016-AHI 18.2/hour, desaturation to 75% with average 88%, body weight 180 pounds CPAP titration sleep study 11/09/16.-6 cwp-  Needed supplemental oxygen because of sustained low saturation with 17.5 minutes recording saturation less than 88% on CPAP Office Spirometry 02/21/17-moderate restriction consistent with body habitus. FVC 1.70/66%, FEV1 1.49/77%, ratio 0.87, FEF 25-75% 2.39/146%. -------------------------------------------------------- 09/28/17- 72 year old female never smoker followed for allergic rhinitis, rhinosinusitis, asthmatic bronchitis, ILD, OSA,complicated by GERD/Crohn's, Rheumatoid arthritis/prednisone CPAP auto 5-15/Advanced   ----Pt has had cough with wheezing and SOB for past 1 month. Pt is coughing up green phlegm. 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  Now for the past month has had a bronchitis pattern with green productive cough.  Nasal congestion.  Says airway infections flare after antibiotics are stopped.  Last amoxicillin 3 weeks ago. CPAP prevents  snoring and she sleeps well with it-confirmed by her husband.  01/19/2018- 72 year old female never smoker followed for allergic rhinitis, rhinosinusitis, asthmatic bronchitis, ILD, OSA,complicated by GERD/Crohn's, Rheumatoid arthritis/prednisone CPAP auto 5-15/[facility]   Seen 5/28 for r/o PNA. Rx'd prednisone, levaquin. CXR ----Bronchitis: Pt states she is having SOB and wheezing as well; worse when doing PT 2 times a  week right now.  Acute infection resolved after 12 days of Levaquin.  Now she notices wheezing and dyspnea mainly with exertion at PT.  Foot is slowly healing after latest surgery at [facility].  Needs refill on nebulizer medication-has not been using. Husband says without CPAP she has shallow panting at night, suggestive of OHS consistent with her body habitus. No download at this visit.  They describe excellent compliance and control. CXR 12/12/17 IMPRESSION: Chronic bronchitic changes. Patchy airspace opacity on the left may reflect early pneumonia. One cannot exclude low-grade CHF in the appropriate clinical setting. Followup PA and lateral chest X-ray is recommended in 3-4 weeks following trial of antibiotic therapy to ensure resolution and exclude underlying malignancy. Thoracic aortic atherosclerosis.  ROS-see HPI   + = positive Constitutional:     weight loss, night sweats, fevers, chills, fatigue, lassitude. HEENT:   No-  headaches, difficulty swallowing, tooth/dental problems, sore throat,       No-  sneezing, itching, ear ache,  nasal congestion, +post nasal drip,  CV:  No-   chest pain, orthopnea, PND, swelling in lower extremities, anasarca,                                                     dizziness, palpitations Resp: +  shortness of breath with exertion or at rest.  productive cough, non-productive cough,  No- coughing up of blood.              change in color of mucus.  +- wheezing.   Skin: No-   rash or lesions. GI:  No-   heartburn, indigestion,  abdominal pain, nausea, vomiting,  GU:  MS: +  joint pain or swelling.   Neuro-     nothing unusual Psych:  No- change in mood or affect. No depression or anxiety.  No memory loss.  Objective:  OBJ- Physical Exam  General- Alert, Oriented, Affect-appropriate, Distress- none acute, + Obese, Skin- + stasis changes  lower legs Lymphadenopathy- none Head- atraumatic            Eyes- Gross vision intact, PERRLA, conjunctivae and secretions clear            Ears- Hearing, canals-normal            Nose- Clear, no-Septal dev, mucus, polyps, erosion, perforation             Throat- Mallampati II , mucosa-clear, drainage- none, tonsils-                                               atrophic, voice normal,  Neck- flexible , trachea midline, no stridor , thyroid nl, carotid no bruit Chest - symmetrical excursion , unlabored           Heart/CV- RRR , no murmur , no gallop  , no rub, nl s1 s2                           - JVD- none , edema- none, stasis changes+, varices- none           Lung-    clear, cough-none, unlabored, wheeze-none,               Chest wall-  Abd-  Br/ Gen/ Rectal- Not done, not indicated Extrem-+ feet are surgically deformed Neuro- grossly intact to observation

## 2018-01-19 NOTE — Telephone Encounter (Signed)
Called and spoke with [facility] with [facility] off [address]. She advised that she is needing dx code for pt's Duoneb Rx. Advised her over the phone ASTHMATIC BRONCHITIS, ACUTE [phone]  Also refaxed in the order by fax to [phone] Nothing further needed.

## 2018-02-02 ENCOUNTER — Ambulatory Visit (INDEPENDENT_AMBULATORY_CARE_PROVIDER_SITE_OTHER)
Admission: RE | Admit: 2018-02-02 | Discharge: 2018-02-02 | Disposition: A | Discharge status: Home / Self Care | Payer: Medicare Other | Source: Ambulatory Visit | Attending: Internal Medicine | Admitting: Internal Medicine

## 2018-02-02 DIAGNOSIS — J849 Interstitial pulmonary disease, unspecified: Secondary | ICD-10-CM

## 2018-02-07 IMAGING — CT CT PARANASAL SINUSES LIMITED
1 of 2 series · 9 of 12 positions shown, 12 images · non-contrast
Comparison: 03/05/2008

CLINICAL DATA: Chronic productive cough with sinus drainage and
sinusitis for 2 years.

EXAM:
CT PARANASAL SINUS LIMITED WITHOUT CONTRAST
TECHNIQUE: Non-contiguous multidetector CT images of the paranasal sinuses were
obtained in a single plane without contrast.

[Series 4: limited sinus st · axial · 0.27mm/px · z∈[+128,+208]mm · 9 of 11 slices shown, 12 images]
[im 2/11  brain]
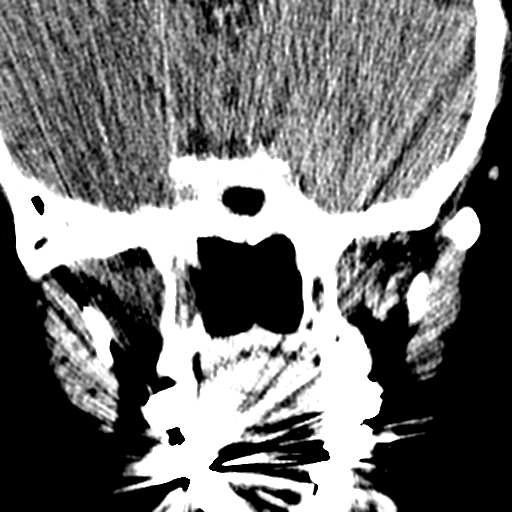
[im 2/11  bone]
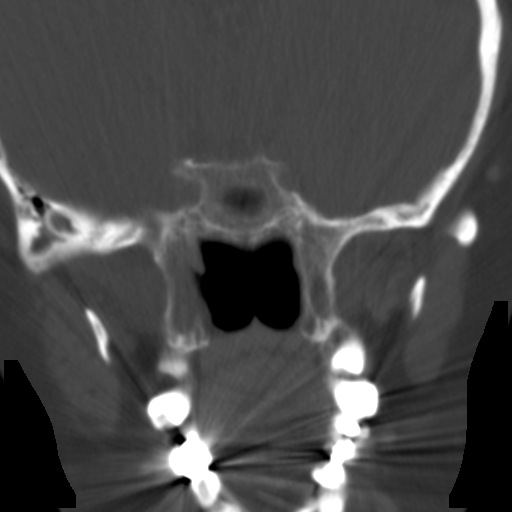
[im 3/11  bone]
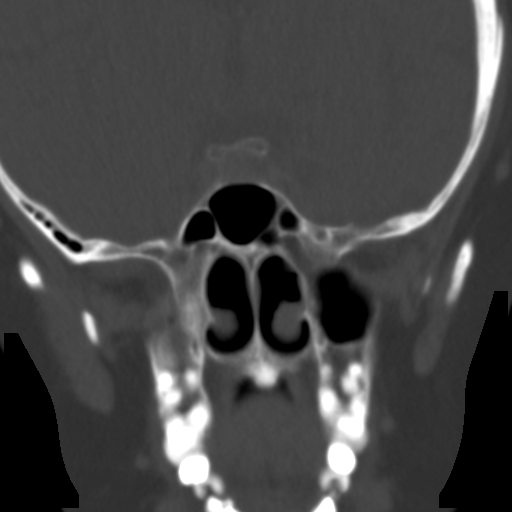
[im 4/11  bone]
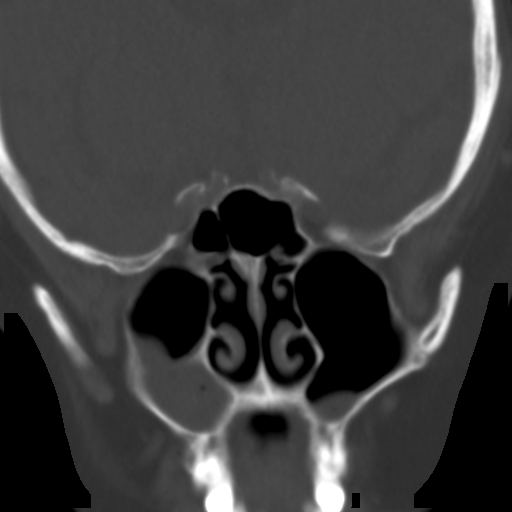
[im 5/11  bone]
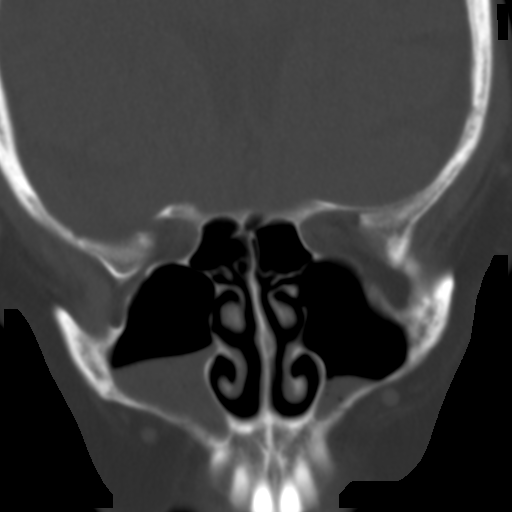
[im 6/11  brain]
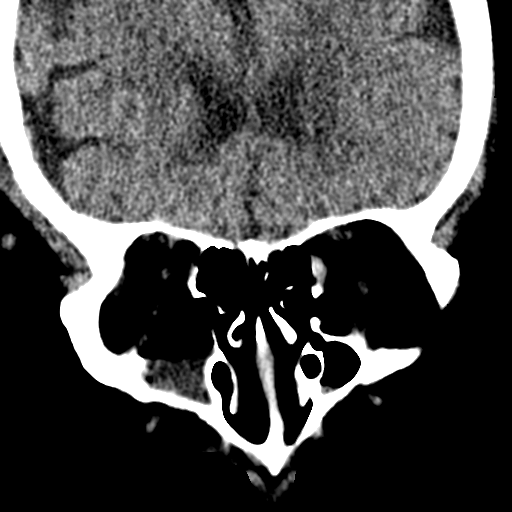
[im 6/11  bone]
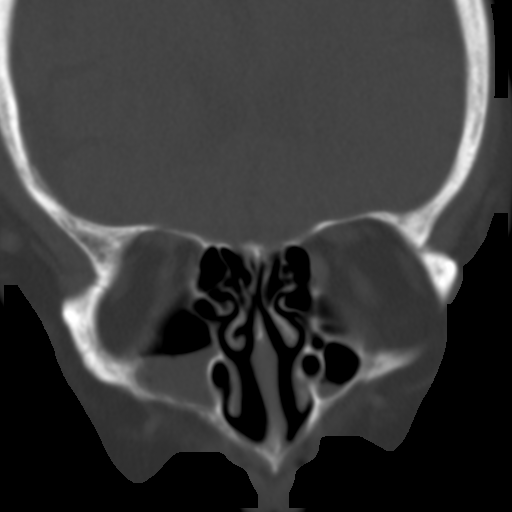
[im 7/11  bone]
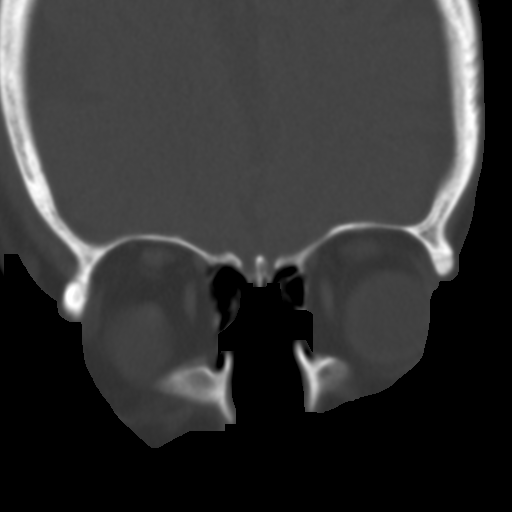
[im 8/11  bone]
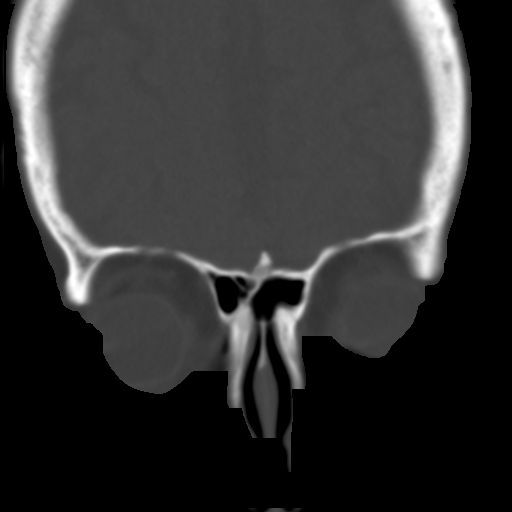
[im 9/11  bone]
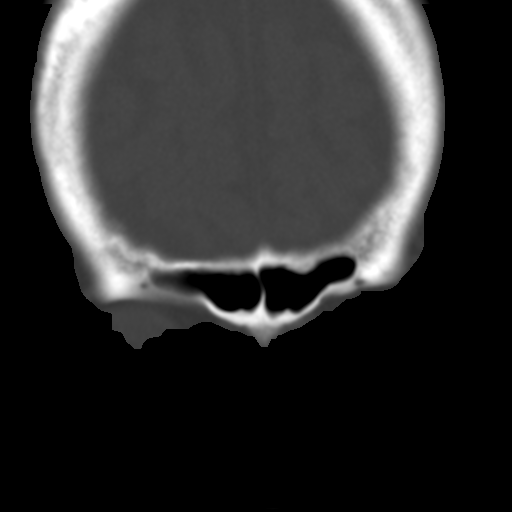
[im 10/11  brain]
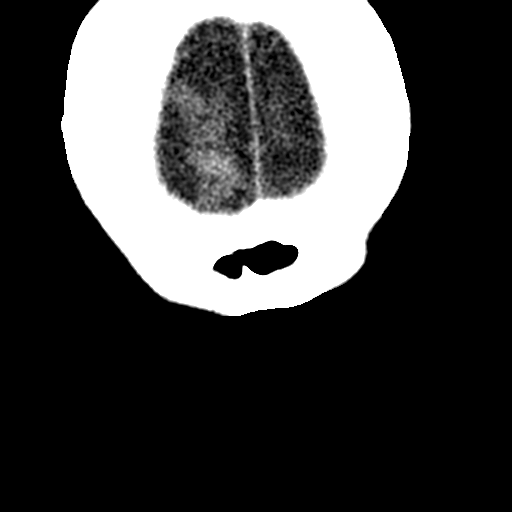
[im 10/11  bone]
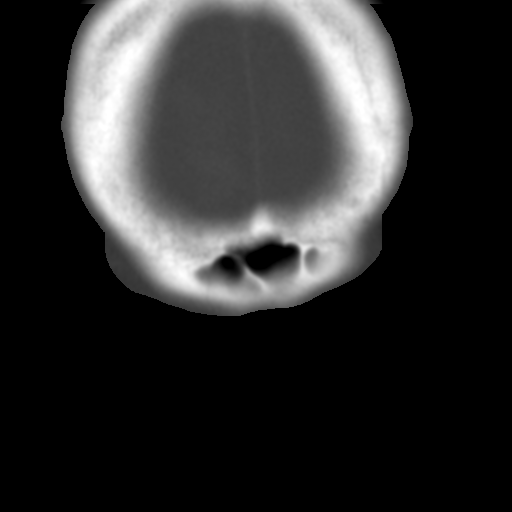

[9 of 12 positions shown; findings below may reference images not displayed]

FINDINGS: Chronic left sinusitis with inferior mucosal thickening encompassing
the nearly half of the sinus volume. Mild right chronic sinusitis
with mucosal thickening inferiorly as well, although lesser in
magnitude.

Visualized portions of the sphenoid, ethmoid, and frontal sinuses
are clear. Questionable left mastoid effusion, image 10 series 3. No
visualized ancillary intracranial or orbital abnormalities.
IMPRESSION: 1. Moderate left and mild right chronic maxillary sinusitis.
Questionable left mastoid effusion.

## 2018-03-14 ENCOUNTER — Other Ambulatory Visit: Payer: Self-pay | Admitting: Internal Medicine

## 2018-03-14 NOTE — Progress Notes (Signed)
_0  ID: [name], female    DOB: 1946/06/30, 72 y.o.   MRN: [id number]  Chief Complaint  Patient presents with  . Acute Visit    cough with yellow and white mucus, chest rattle, wheezing SOB for 10 days    Referring provider: Leonard Downing, *  HPI: 72 year old female never smoker followed in our office for allergic rhinitis, rhinosinusitis, asthmatic bronchitis, ILD  PMH: GERD, rheumatoid arthritis, ASCVD Smoker/ Smoking History: Never smoker Maintenance: Plaquenil, Anoro Ellipta Pt of: Dr. Annamaria Boots  03/15/2018  - Visit   Patient presents to office for acute visit today.  Patient reports having a cough with yellow to white mucus for the last 7 to 10 days.  Patient also reporting she is been having increased wheezing and chest rattle.  Patient CPAP compliance report showing compliance 29 to 30 days used.  29 of those days greater than 4 hours.  Average usage days 8 hours and 1 minute.  APAP settings 5-15.  95th percentile pressure 9.7.  AHI 1.   Tests:   PFT 09/03/2015-mild obstructive airways disease, minimal restriction, mild diffusion defect. FVC 2.17/83%, FEV1 1.80/92%, ratio 0.83. No response to bronchodilator. TLC 78%, DLCO 73%. Office Spirometry 02/21/17-moderate restriction consistent with body habitus. FVC 1.70/66%, FEV1 1.49/77%, ratio 0.87, FEF 25-75% 2.39/146%.  Unattended Home Sleep Test 08/12/2016-AHI 18.2/hour, desaturation to 75% with average 88%, body weight 180 pounds CPAP titration sleep study 11/09/16.-6 cwp-  Needed supplemental oxygen because of sustained low saturation with 17.5 minutes recording saturation less than 88% on CPAP  Imaging:   12/30/2016-chest x-ray- no active cardiopulmonary disease 03/14/2016-CT chest high-res- mild basilar predominant subpleural reticulation and traction bronchiolectasis, scattered tiny pulmonary nodules measuring 4 mm or less  Cardiac:  Nuclear stress test 05/20/2016-no evidence of significant  blockage Echocardiogram 05/11/2016- WNL EF 66-06%, grade 1 diastolic dysfunction  Micro: 09/07/2016-sputum culture-abundant gram-positive cocci in pairs and clusters, scant growth of yeast   Labs:  Respiratory Viral Panel 08/01/16- POSITIVE Influenza A H3   Chart Review:      Allergies  Allergen Reactions  . Cefdinir Diarrhea  . Erythromycin Base Diarrhea    Other  . Lactose Intolerance (Gi) Diarrhea  . Lactulose Diarrhea  . Mesalamine Nausea Only  . Nitrofuran Derivatives     shakiness  . Nitrofurantoin Other (See Comments)    shakiness other  . Other Diarrhea and Other (See Comments)    Shaking uncontrollably "lettuce only" "lettuce only"  . Sulfa Antibiotics Other (See Comments)    Patient can't recall reaction   . Tdap [Tetanus-Diphth-Acell Pertussis]     Shaking uncontrollably  . Tetanus Toxoid, Adsorbed Other (See Comments)    Shaking uncontrollable     Immunization History  Administered Date(s) Administered  . Influenza Split 03/18/2012, 03/18/2013, 04/14/2014, 04/17/2017  . Influenza Whole 04/30/2008, 04/20/2009, 04/18/2011  . Influenza, High Dose Seasonal PF 04/23/2015, 03/24/2016  . Pneumococcal Conjugate-13 11/11/2013  . Zoster Recombinat (Shingrix) 05/05/2017    Past Medical History:  Diagnosis Date  . Acute asthmatic bronchitis   . Allergic rhinitis   . Esophageal reflux   . Irritable bowel syndrome   . Rheumatoid arthritis(714.0)     Tobacco History: Social History   Tobacco Use  Smoking Status Never Smoker  Smokeless Tobacco Never Used  Tobacco Comment   positive passive tobacco smoke exposure   Counseling given: Yes Comment: positive passive tobacco smoke exposure Continue not smoking.  Outpatient Encounter Medications as of 03/15/2018  Medication Sig  . albuterol (PROVENTIL) (  2.5 MG/3ML) 0.083% nebulizer solution UE 1 VIAL IN NEBULIZER EVERY 6 HOURS AS NEEDED FOR WHEEZING OR SHORTNESS OF BREATH  . ALPRAZolam (XANAX) 0.5 MG  tablet Take 0.5 mg by mouth as needed.  [name] ELLIPTA 62.5-25 MCG/INH AEPB USE 1 INHALATION DAILY  . aspirin EC 81 MG tablet Take 81 mg by mouth daily.  . benzonatate (TESSALON) 200 MG capsule TAKE 1 CAPSULE BY MOUTH 3 TIMES A DAY AS NEEDED FOR COUGH  . cetirizine (ZYRTEC) 10 MG tablet Take 10 mg by mouth daily.  . clidinium-chlordiazePOXIDE (LIBRAX) 5-2.5 MG capsule Take 1 capsule by mouth 2 (two) times daily as needed (for IBS).  . colestipol (COLESTID) 1 G tablet Take 1 g by mouth 2 (two) times daily.   . [facility] D3 5000 units capsule Take 5,000 Units by mouth daily.  Marland Kitchen FLUoxetine (PROZAC) 20 MG capsule Take 20 mg by mouth daily.  . furosemide (LASIX) 40 MG tablet TAKE 1 TABLET BY MOUTH TWO  TIMES DAILY  . glycopyrrolate (ROBINUL) 1 MG tablet Take 1 mg by mouth 3 (three) times daily as needed (for IBS).   . hydroxychloroquine (PLAQUENIL) 200 MG tablet Take 600 mg by mouth daily.   . hydroxypropyl methylcellulose / hypromellose (ISOPTO TEARS / GONIOVISC) 2.5 % ophthalmic solution Place 1-2 drops into both eyes 3 (three) times daily as needed for dry eyes.  Marland Kitchen ipratropium (ATROVENT) 0.03 % nasal spray USE 1 TO 2 SPRAYS INTO BOTH NOSTRILS 2 TIMES DAILY.  Marland Kitchen ipratropium-albuterol (DUONEB) 0.5-2.5 (3) MG/3ML SOLN Take 3 mLs by nebulization every 6 (six) hours as needed.  . leflunomide (ARAVA) 20 MG tablet Take 20 mg by mouth daily.   . mirtazapine (REMERON) 45 MG tablet Take 45 mg by mouth at bedtime.  . montelukast (SINGULAIR) 10 MG tablet TAKE 1 TABLET BY MOUTH AT  BEDTIME  . Multiple Vitamin (MULTIVITAMIN) tablet Take 1 tablet by mouth daily.  Marland Kitchen omeprazole (PRILOSEC) 40 MG capsule Take 40 mg by mouth daily.  . potassium chloride SA (K-DUR,KLOR-CON) 20 MEQ tablet TAKE 1 TABLET BY MOUTH 3  TIMES DAILY  . rosuvastatin (CRESTOR) 20 MG tablet TAKE 1 TABLET BY MOUTH  DAILY  . traMADol (ULTRAM) 50 MG tablet Take 50 mg by mouth 2 (two) times daily.   . verapamil (CALAN-SR) 240 MG CR tablet Take 240 mg by  mouth daily.  Marland Kitchen acetaminophen (TYLENOL) 500 MG tablet Take 2 tablets by mouth every 6 (six) hours.  Marland Kitchen amoxicillin-clavulanate (AUGMENTIN) 875-125 MG tablet Take 1 tablet by mouth 2 (two) times daily.  . fluconazole (DIFLUCAN) 150 MG tablet Take 1 tablet (150 mg total) by mouth daily. (Patient not taking: Reported on 03/15/2018)  . fluticasone (FLONASE) 50 MCG/ACT nasal spray Place 2 sprays into the nose daily.   . predniSONE (DELTASONE) 10 MG tablet 4 tabs for 2 days, then 3 tabs for 2 days, 2 tabs for 2 days, then 1 tab for 2 days, then stop  . [DISCONTINUED] benzonatate (TESSALON) 200 MG capsule TAKE 1 CAPSULE BY MOUTH 3 TIMES A DAY AS NEEDED FOR COUGH  . [DISCONTINUED] HYDROcodone-Acetaminophen 5-300 MG TABS hydrocodone 5 mg-acetaminophen 300 mg tablet  TAKE 1 TABLET BY MOUTH EVERY 6 HOURS AS NEEDED FOR PAIN  . [DISCONTINUED] predniSONE (DELTASONE) 10 MG tablet 4 tabs for 2 days, then 3 tabs for 2 days, 2 tabs for 2 days, then 1 tab for 2 days, then stop  . [EXPIRED] methylPREDNISolone acetate (DEPO-MEDROL) injection 80 mg    No facility-administered  encounter medications on file as of 03/15/2018.      Review of Systems  Review of Systems  Constitutional: Positive for fatigue and fever (low grade fever - 99.1). Negative for chills and unexpected weight change.  HENT: Positive for congestion and postnasal drip. Negative for ear pain, sinus pressure and sinus pain.   Respiratory: Positive for cough, chest tightness, shortness of breath and wheezing.   Cardiovascular: Negative for chest pain and palpitations.  Gastrointestinal: Negative for blood in stool, diarrhea, nausea and vomiting.  Genitourinary: Negative for dysuria, frequency and urgency.  Musculoskeletal: Negative for arthralgias.  Skin: Negative for color change.  Allergic/Immunologic: Negative for environmental allergies and food allergies.  Neurological: Negative for dizziness, light-headedness and headaches.   Psychiatric/Behavioral: Negative for dysphoric mood. The patient is not nervous/anxious.   All other systems reviewed and are negative.      Physical Exam  BP 122/70 (BP Location: Left Arm, Cuff Size: Normal)   Pulse 78   Temp (!) 97.4 F (36.3 C)   Ht 5' 1" (1.549 m)   Wt 199 lb 6.4 oz (90.4 kg)   SpO2 94%   BMI 37.68 kg/m   Wt Readings from Last 5 Encounters:  03/15/18 199 lb 6.4 oz (90.4 kg)  01/19/18 198 lb 3.2 oz (89.9 kg)  12/12/17 197 lb 6.4 oz (89.5 kg)  09/28/17 192 lb 9.6 oz (87.4 kg)  06/30/17 195 lb (88.5 kg)     Physical Exam  Constitutional: She is oriented to person, place, and time and well-developed, well-nourished, and in no distress. No distress.  HENT:  Head: Normocephalic and atraumatic.  Right Ear: Hearing, tympanic membrane, external ear and ear canal normal.  Left Ear: Hearing, tympanic membrane, external ear and ear canal normal.  Nose: Right sinus exhibits no maxillary sinus tenderness and no frontal sinus tenderness. Left sinus exhibits maxillary sinus tenderness and frontal sinus tenderness.  Mouth/Throat: Uvula is midline and oropharynx is clear and moist. No oropharyngeal exudate.  Hearing aides bilaterally  Eyes: Pupils are equal, round, and reactive to light.  Neck: Normal range of motion. Neck supple. No JVD present.  Cardiovascular: Normal rate, regular rhythm and normal heart sounds.  Pulmonary/Chest: Effort normal. No accessory muscle usage. No respiratory distress. She has no decreased breath sounds. She has wheezes. She has no rhonchi. She has rales (left base ).  Abdominal: Soft. Bowel sounds are normal. There is no tenderness.  Musculoskeletal: Normal range of motion. She exhibits no edema.  Lymphadenopathy:    She has no cervical adenopathy.  Neurological: She is alert and oriented to person, place, and time. Gait normal.  Skin: Skin is warm and dry. She is not diaphoretic. No erythema.  Psychiatric: Mood, memory, affect and  judgment normal.  Nursing note and vitals reviewed.     Lab Results:  CBC    Component Value Date/Time   WBC 9.2 09/23/2016 1252   WBC 11.5 (H) 01/21/2016 1129   RBC 5.08 09/23/2016 1252   RBC 4.77 01/21/2016 1129   HGB 14.4 09/23/2016 1252   HCT 44.7 09/23/2016 1252   PLT 342 09/23/2016 1252   MCV 88 09/23/2016 1252   MCH 28.3 09/23/2016 1252   MCH 29.6 08/03/2013 0548   MCHC 32.2 09/23/2016 1252   MCHC 33.2 01/21/2016 1129   RDW 15.4 09/23/2016 1252   LYMPHSABS 1.2 09/23/2016 1252   MONOABS 0.8 01/21/2016 1129   EOSABS 0.0 09/23/2016 1252   BASOSABS 0.0 09/23/2016 1252    BMET  Component Value Date/Time   NA 142 01/09/2017 1024   K 4.3 01/09/2017 1024   CL 105 01/09/2017 1024   CO2 23 01/09/2017 1024   GLUCOSE 86 01/09/2017 1024   GLUCOSE 72 05/30/2016 1002   BUN 18 01/09/2017 1024   CREATININE 0.92 01/09/2017 1024   CREATININE 0.91 05/30/2016 1002   CALCIUM 10.3 01/09/2017 1024   GFRNONAA 62 01/09/2017 1024   GFRAA 72 01/09/2017 1024    BNP No results found for: BNP  ProBNP    Component Value Date/Time   PROBNP 20.0 01/21/2016 1129    Imaging: No results found.     Assessment & Plan:   Pleasant 72 year old patient seen office visit today.  Patient with bronchitis flare.  We will treat with Depo-Medrol injection today, Augmentin, prednisone taper.    03/15/18 - FENO - 49  Patient to keep follow-up with Dr. Annamaria Boots in October.  Could consider switching patient to ICS Labe combination such as Symbicort at that office visit as FeNO is elevated.   Asthmatic bronchitis, moderate persistent, uncomplicated FeNO today >>> 49  Augmentin >>> Take 1 875-125 mg tablet every 12 hours for the next 10 days >>> Take with food  Prednisone 40m tablet  >>>4 tabs for 2 days, then 3 tabs for 2 days, 2 tabs for 2 days, then 1 tab for 2 days, then stop >>>take with food  >>>take in the morning  >>>start this on 03/16/18  Depomedrol injection today    Anoro Ellipta  >>> Take 1 puff daily in the morning right when you wake up >>>Rinse your mouth out after use >>>This is a daily maintenance inhaler, NOT a rescue inhaler >>>Contact our office if you are having difficulties affording or obtaining this medication >>>It is important for you to be able to take this daily and not miss any doses  Keep scheduled follow-up with Dr. YAnnamaria Boots It is flu season:   >>>Remember to be washing your hands regularly, using hand sanitizer, be careful to use around herself with has contact with people who are sick will increase her chances of getting sick yourself. >>> Best ways to protect herself from the flu: Receive the yearly flu vaccine, practice good hand hygiene washing with soap and also using hand sanitizer when available, eat a nutritious meals, get adequate rest, hydrate appropriately   Obstructive sleep apnea We recommend that you continue using your CPAP daily >>>Keep up the hard work using your device >>> Goal should be wearing this for the entire night that you are sleeping, at least 4 to 6 hours  Remember:  . Do not drive or operate heavy machinery if tired or drowsy.  . Please notify the supply company and office if you are unable to use your device regularly due to missing supplies or machine being broken.  . Work on maintaining a healthy weight and following your recommended nutrition plan  . Maintain proper daily exercise and movement  . Maintaining proper use of your device can also help improve management of other chronic illnesses such as: Blood pressure, blood sugars, and weight management.   BiPAP/ CPAP Cleaning:  >>>Clean weekly, with Dawn soap, and bottle brush.  Set up to air dry.       BLauraine Rinne NP 03/15/2018

## 2018-03-15 ENCOUNTER — Encounter: Payer: Self-pay | Admitting: Pulmonary Disease

## 2018-03-15 ENCOUNTER — Ambulatory Visit (INDEPENDENT_AMBULATORY_CARE_PROVIDER_SITE_OTHER): Payer: Medicare Other | Admitting: Pulmonary Disease

## 2018-03-15 VITALS — BP 122/70 | HR 78 | Temp 97.4°F | Ht 61.0 in | Wt 199.4 lb

## 2018-03-15 DIAGNOSIS — G4733 Obstructive sleep apnea (adult) (pediatric): Secondary | ICD-10-CM | POA: Diagnosis not present

## 2018-03-15 DIAGNOSIS — J4540 Moderate persistent asthma, uncomplicated: Secondary | ICD-10-CM | POA: Diagnosis not present

## 2018-03-15 LAB — POCT EXHALED NITRIC OXIDE: FeNO level (ppb): 49

## 2018-03-15 MED ORDER — PREDNISONE 10 MG PO TABS: ORAL_TABLET | ORAL | 0 refills | Status: DC

## 2018-03-15 MED ORDER — AMOXICILLIN-POT CLAVULANATE 875-125 MG PO TABS: 1.0000 | ORAL_TABLET | Freq: Two times a day (BID) | ORAL | 0 refills | Status: DC

## 2018-03-15 MED ORDER — METHYLPREDNISOLONE ACETATE 80 MG/ML IJ SUSP
80.0000 mg | Freq: Once | INTRAMUSCULAR | Status: AC
Start: 2018-03-15 — End: 2018-03-15
  Administered 2018-03-15: 80 mg via INTRAMUSCULAR

## 2018-03-15 NOTE — Progress Notes (Signed)
Discussed results with patient in office.  Nothing further is needed at this time.  Sudais Banghart FNP  

## 2018-03-15 NOTE — Assessment & Plan Note (Signed)
FeNO today >>> 49  Augmentin >>> Take 1 875-125 mg tablet every 12 hours for the next 10 days >>> Take with food  Prednisone 86m tablet  >>>4 tabs for 2 days, then 3 tabs for 2 days, 2 tabs for 2 days, then 1 tab for 2 days, then stop >>>take with food  >>>take in the morning  >>>start this on 03/16/18  Depomedrol injection today   Anoro Ellipta  >>> Take 1 puff daily in the morning right when you wake up >>>Rinse your mouth out after use >>>This is a daily maintenance inhaler, NOT a rescue inhaler >>>Contact our office if you are having difficulties affording or obtaining this medication >>>It is important for you to be able to take this daily and not miss any doses  Keep scheduled follow-up with Dr. YAnnamaria Boots It is flu season:   >>>Remember to be washing your hands regularly, using hand sanitizer, be careful to use around herself with has contact with people who are sick will increase her chances of getting sick yourself. >>> Best ways to protect herself from the flu: Receive the yearly flu vaccine, practice good hand hygiene washing with soap and also using hand sanitizer when available, eat a nutritious meals, get adequate rest, hydrate appropriately

## 2018-03-15 NOTE — Assessment & Plan Note (Signed)

## 2018-03-15 NOTE — Patient Instructions (Addendum)
FeNO today   Augmentin >>> Take 1 875-125 mg tablet every 12 hours for the next 10 days >>> Take with food  Prednisone 47m tablet  >>>4 tabs for 2 days, then 3 tabs for 2 days, 2 tabs for 2 days, then 1 tab for 2 days, then stop >>>take with food  >>>take in the morning  >>>start this on 03/16/18  Depomedrol injection today   Anoro Ellipta  >>> Take 1 puff daily in the morning right when you wake up >>>Rinse your mouth out after use >>>This is a daily maintenance inhaler, NOT a rescue inhaler >>>Contact our office if you are having difficulties affording or obtaining this medication >>>It is important for you to be able to take this daily and not miss any doses   We will also place an order to your DME company to help allow you to obtain nebulizer supplies  We recommend that you continue using your CPAP daily >>>Keep up the hard work using your device >>> Goal should be wearing this for the entire night that you are sleeping, at least 4 to 6 hours  Remember:  . Do not drive or operate heavy machinery if tired or drowsy.  . Please notify the supply company and office if you are unable to use your device regularly due to missing supplies or machine being broken.  . Work on maintaining a healthy weight and following your recommended nutrition plan  . Maintain proper daily exercise and movement  . Maintaining proper use of your device can also help improve management of other chronic illnesses such as: Blood pressure, blood sugars, and weight management.   BiPAP/ CPAP Cleaning:  >>>Clean weekly, with Dawn soap, and bottle brush.  Set up to air dry.    Keep scheduled follow-up with Dr. YAnnamaria Boots  It is flu season:   >>>Remember to be washing your hands regularly, using hand sanitizer, be careful to use around herself with has contact with people who are sick will increase her chances of getting sick yourself. >>> Best ways to protect herself from the flu: Receive the yearly flu  vaccine, practice good hand hygiene washing with soap and also using hand sanitizer when available, eat a nutritious meals, get adequate rest, hydrate appropriately   Please contact the office if your symptoms worsen or you have concerns that you are not improving.   Thank you for choosing [facility] for your healthcare, and for allowing [facility] partner with you on your healthcare journey. I am thankful to be able to provide care to you today.   BWyn QuakerFNP-C

## 2018-03-16 ENCOUNTER — Telehealth: Payer: Self-pay | Admitting: Pulmonary Disease

## 2018-03-16 MED ORDER — PROMETHAZINE-CODEINE 6.25-10 MG/5ML PO SYRP: ORAL_SOLUTION | ORAL | 0 refills | Status: DC

## 2018-03-16 NOTE — Telephone Encounter (Signed)
Contact patient and ensure they are using over-the-counter medicine such as Delsym.  If patient is using over-the-counter regimens and the cough is still persisting then patient can have:   promethazine-codeine (PHENERGAN WITH CODEINE) 6.25-10 MG/5ML syrup Take 5 mLs by mouth every 12 hours as needed for up to 10 days for cough.   Please place the order.    Please inform the patient this medication will make her sleepy.  Not to operate any motor vehicles at this time.  Careful with the dosing.  Do not overuse this medication.    If the patient is needing additional refills of this medication in the future they will need to present for an office visit.   Wyn Quaker FNP

## 2018-03-16 NOTE — Telephone Encounter (Signed)
Spoke with patient, patient states she has tried Robitussin DM with no success. Script printed and left up front for patient to pick up. Explained to patient this medication will make her sleepy.  Not to operate any motor vehicles at this time. Do not overuse this medication. If needing additional refills of this medication in the future they will need to present for an office visit. Voiced understanding. Nothing further needed at this time.

## 2018-03-16 NOTE — Telephone Encounter (Signed)
Patient called requesting cough medicine and would prefer one with codeine.  She was seen 03/15/18 by Wyn Quaker, NP.  Will route to [facility]

## 2018-03-21 ENCOUNTER — Other Ambulatory Visit: Payer: Self-pay | Admitting: Internal Medicine

## 2018-03-21 DIAGNOSIS — I2510 Atherosclerotic heart disease of native coronary artery without angina pectoris: Secondary | ICD-10-CM

## 2018-03-21 DIAGNOSIS — E785 Hyperlipidemia, unspecified: Secondary | ICD-10-CM

## 2018-03-22 NOTE — Telephone Encounter (Signed)
Please review for refill, Thanks !

## 2018-03-29 ENCOUNTER — Other Ambulatory Visit: Payer: Self-pay | Admitting: Internal Medicine

## 2018-03-29 DIAGNOSIS — I2510 Atherosclerotic heart disease of native coronary artery without angina pectoris: Secondary | ICD-10-CM

## 2018-03-29 DIAGNOSIS — E785 Hyperlipidemia, unspecified: Secondary | ICD-10-CM

## 2018-03-30 NOTE — Telephone Encounter (Signed)
Please review for refill

## 2018-05-09 ENCOUNTER — Ambulatory Visit (INDEPENDENT_AMBULATORY_CARE_PROVIDER_SITE_OTHER): Payer: Medicare Other | Admitting: Internal Medicine

## 2018-05-09 ENCOUNTER — Encounter: Payer: Self-pay | Admitting: Internal Medicine

## 2018-05-09 DIAGNOSIS — I2510 Atherosclerotic heart disease of native coronary artery without angina pectoris: Secondary | ICD-10-CM | POA: Diagnosis not present

## 2018-05-09 DIAGNOSIS — T8149XA Infection following a procedure, other surgical site, initial encounter: Secondary | ICD-10-CM | POA: Diagnosis not present

## 2018-05-09 NOTE — Progress Notes (Signed)
[facility] for Infectious [facility]      Reason for Consult: Dehiscence of wound    Referring Physician: Dr. Donnie Mesa    [name], female    DOB: 07-15-1946, 72 y.o.   MRN: [id number]  HPI:   She has a history of arthrodesis of her left foot great toe metatarsal phalangeal joint in September 2018 including hardware placement but gives a history of persistent pain in the area essentially constant, since the surgery.  She also had some wound dehiscence and has had monitoring of her wound.  Her surgeon earlier this month, on October 1, took her back to the OR to remove all of the hardware as it seemed to be coming out.  Appropriate samples were sent to the OR and her pathology of the bone did show no acute inflammation though she did have a superficial culture that was positive for MSSA.  She has been on antibiotics including Cipro prior to the surgery as well as several days following the surgery, followed by clindamycin and then last week was changed to oral doxycycline.  She was given a 10-day course of that.  She does feel the pain is improved, she has not noted any pus draining from the open wound.  No associated rash or diarrhea.  She did have inflammatory markers done prior to the surgery and her sed rate and CRP were both within normal limits.  Previous record reviewed in epic and summarized as above.  Past Medical History:  Diagnosis Date  . Acute asthmatic bronchitis   . Allergic rhinitis   . Esophageal reflux   . Irritable bowel syndrome   . Rheumatoid arthritis(714.0)     Prior to Admission medications   Medication Sig Start Date End Date Taking? Authorizing Provider  acetaminophen (TYLENOL) 500 MG tablet Take 2 tablets by mouth every 6 (six) hours.   Yes [provider]  albuterol (PROVENTIL) (2.5 MG/3ML) 0.083% nebulizer solution UE 1 VIAL IN NEBULIZER EVERY 6 HOURS AS NEEDED FOR WHEEZING OR SHORTNESS OF BREATH 10/21/16  Yes Young, Clinton D, MD    ALPRAZolam Duanne Moron) 0.5 MG tablet Take 0.5 mg by mouth as needed.   Yes [provider]  Celedonio Miyamoto 62.5-25 MCG/INH AEPB USE 1 INHALATION DAILY 09/25/17  Yes [name] D, MD  aspirin EC 81 MG tablet Take 81 mg by mouth daily.   Yes [provider]  benzonatate (TESSALON) 200 MG capsule TAKE 1 CAPSULE BY MOUTH 3 TIMES A DAY AS NEEDED FOR COUGH 03/15/18  Yes Young, Clinton D, MD  cetirizine (ZYRTEC) 10 MG tablet Take 10 mg by mouth daily.   Yes [provider]  clidinium-chlordiazePOXIDE (LIBRAX) 5-2.5 MG capsule Take 1 capsule by mouth 2 (two) times daily as needed (for IBS).   Yes [provider]  colestipol (COLESTID) 1 G tablet Take 1 g by mouth 2 (two) times daily.    Yes [provider]  [employer] D3 5000 units capsule Take 5,000 Units by mouth daily. 05/28/17  Yes [provider]  fluconazole (DIFLUCAN) 150 MG tablet Take 1 tablet (150 mg total) by mouth daily. 09/28/17  Yes Young, Tarri Fuller D, MD  FLUoxetine (PROZAC) 20 MG capsule Take 20 mg by mouth daily. 08/11/16  Yes [provider]  fluticasone (FLONASE) 50 MCG/ACT nasal spray Place 2 sprays into the nose daily.    Yes [provider]  furosemide (LASIX) 40 MG tablet TAKE 1 TABLET BY MOUTH TWO  TIMES  DAILY 05/01/17  Yes End, Harrell Gave, MD  glycopyrrolate (ROBINUL) 1 MG tablet Take 1 mg by mouth 3 (three) times daily as needed (for IBS).    Yes [provider]  hydroxychloroquine (PLAQUENIL) 200 MG tablet Take 600 mg by mouth daily.    Yes [provider]  hydroxypropyl methylcellulose / hypromellose (ISOPTO TEARS / GONIOVISC) 2.5 % ophthalmic solution Place 1-2 drops into both eyes 3 (three) times daily as needed for dry eyes.   Yes [provider]  ipratropium (ATROVENT) 0.03 % nasal spray USE 1 TO 2 SPRAYS INTO BOTH NOSTRILS 2 TIMES DAILY. 01/19/18  Yes Young, Clinton D, MD  ipratropium-albuterol (DUONEB) 0.5-2.5 (3) MG/3ML SOLN Take 3 mLs by  nebulization every 6 (six) hours as needed. 01/19/18  Yes Young, Tarri Fuller D, MD  leflunomide (ARAVA) 20 MG tablet Take 20 mg by mouth daily.  03/24/16  Yes [provider]  mirtazapine (REMERON) 45 MG tablet Take 45 mg by mouth at bedtime. 08/10/16  Yes [provider]  montelukast (SINGULAIR) 10 MG tablet TAKE 1 TABLET BY MOUTH AT  BEDTIME 03/30/18  Yes Young, Tarri Fuller D, MD  Multiple Vitamin (MULTIVITAMIN) tablet Take 1 tablet by mouth daily.   Yes [provider]  omeprazole (PRILOSEC) 40 MG capsule Take 40 mg by mouth daily.   Yes [provider]  potassium chloride SA (K-DUR,KLOR-CON) 20 MEQ tablet TAKE 1 TABLET BY MOUTH 3  TIMES DAILY 03/30/18  Yes End, Harrell Gave, MD  predniSONE (DELTASONE) 10 MG tablet 4 tabs for 2 days, then 3 tabs for 2 days, 2 tabs for 2 days, then 1 tab for 2 days, then stop 03/15/18  Yes Mack, Vinson Moselle, NP  promethazine-codeine (PHENERGAN WITH CODEINE) 6.25-10 MG/5ML syrup Take 5 mLs by mouth every 12 hours as needed for up to 10 days for cough. 03/16/18  Yes Lauraine Rinne, NP  rosuvastatin (CRESTOR) 20 MG tablet TAKE 1 TABLET BY MOUTH  DAILY 03/30/18  Yes End, Harrell Gave, MD  traMADol (ULTRAM) 50 MG tablet Take 50 mg by mouth 2 (two) times daily.    Yes [provider]  verapamil (CALAN-SR) 240 MG CR tablet Take 240 mg by mouth daily. 08/11/16  Yes [provider]    Allergies  Allergen Reactions  . Cefdinir Diarrhea  . Erythromycin Base Diarrhea    Other  . Lactose Intolerance (Gi) Diarrhea  . Lactulose Diarrhea  . Mesalamine Nausea Only  . Nitrofuran Derivatives     shakiness  . Nitrofurantoin Other (See Comments)    shakiness other  . Other Diarrhea and Other (See Comments)    Shaking uncontrollably "lettuce only" "lettuce only"  . Sulfa Antibiotics Other (See Comments)    Patient can't recall reaction   . Tdap [Tetanus-Diphth-Acell Pertussis]     Shaking uncontrollably  . Tetanus Toxoid, Adsorbed Other  (See Comments)    Shaking uncontrollable     Social History   Tobacco Use  . Smoking status: Never Smoker  . Smokeless tobacco: Never Used  . Tobacco comment: positive passive tobacco smoke exposure  Substance Use Topics  . Alcohol use: Yes    Comment: glass of wine each night   . Drug use: No    Family History  Problem Relation Age of Onset  . Heart disease Mother   . Arthritis Mother   . Heart attack Father   . Diabetes Other        sibling  . Heart attack Other  sibling     Review of Systems  Constitutional: negative for fevers, chills and anorexia Gastrointestinal: negative for nausea and diarrhea Musculoskeletal: no myalgias All other systems reviewed and are negative    Constitutional: in no apparent distress  Vitals:   05/09/18 1408  BP: (!) 146/82  Pulse: 87  Temp: 98.4 F (36.9 C)   EYES: anicteric ENMT: no thrush Cardiovascular: Cor RRR Respiratory: CTA B; normal respiratory effort GI: soft Musculoskeletal:left great toe with open wound, stitches remain in place, no surrounding erythema, no drainage, some serosanguinous drainage noted on bandage, no tenderness Skin: no rash Neuro: non focal  Labs: Lab Results  Component Value Date   WBC 9.2 09/23/2016   HGB 14.4 09/23/2016   HCT 44.7 09/23/2016   MCV 88 09/23/2016   PLT 342 09/23/2016    Lab Results  Component Value Date   CREATININE 0.92 01/09/2017   BUN 18 01/09/2017   NA 142 01/09/2017   K 4.3 01/09/2017   CL 105 01/09/2017   CO2 23 01/09/2017    Lab Results  Component Value Date   ALT 23 01/09/2017   AST 24 01/09/2017   ALKPHOS 88 01/09/2017   BILITOT 0.4 01/09/2017   INR 1.0 09/12/2016     Assessment: Wound dehiscence with no signs of osteomyelitis.  I discussed the findings with the patient including noninflamed bone and noninfected looking toe.  There is a positive culture with MSSA but no bone culture with bacteria.  I told her to go ahead and stop the antibiotics  when she finishes the 3 days of doxycycline.  I will though check her inflammatory markers to be sure it has remained stable.  Otherwise she can follow-up as needed.  Plan: 1) ESR, CrP 2) observe off of antibiotics

## 2018-05-10 LAB — SEDIMENTATION RATE: Sed Rate: 2 mm/h (ref 0–30)

## 2018-05-10 LAB — C-REACTIVE PROTEIN: CRP: 1.4 mg/L (ref <8.0)

## 2018-05-21 ENCOUNTER — Ambulatory Visit (INDEPENDENT_AMBULATORY_CARE_PROVIDER_SITE_OTHER): Payer: Medicare Other | Admitting: Internal Medicine

## 2018-05-21 ENCOUNTER — Encounter: Payer: Self-pay | Admitting: Internal Medicine

## 2018-05-21 VITALS — BP 126/82 | HR 81 | Ht 61.0 in | Wt 202.8 lb

## 2018-05-21 DIAGNOSIS — J849 Interstitial pulmonary disease, unspecified: Secondary | ICD-10-CM | POA: Diagnosis not present

## 2018-05-21 DIAGNOSIS — Z23 Encounter for immunization: Secondary | ICD-10-CM

## 2018-05-21 DIAGNOSIS — J4540 Moderate persistent asthma, uncomplicated: Secondary | ICD-10-CM | POA: Diagnosis not present

## 2018-05-21 DIAGNOSIS — I2510 Atherosclerotic heart disease of native coronary artery without angina pectoris: Secondary | ICD-10-CM

## 2018-05-21 DIAGNOSIS — G4733 Obstructive sleep apnea (adult) (pediatric): Secondary | ICD-10-CM

## 2018-05-21 MED ORDER — BUDESONIDE-FORMOTEROL FUMARATE 160-4.5 MCG/ACT IN AERO: INHALATION_SPRAY | RESPIRATORY_TRACT | 6 refills | Status: DC

## 2018-05-21 MED ORDER — BUDESONIDE-FORMOTEROL FUMARATE 160-4.5 MCG/ACT IN AERO: 2.0000 | INHALATION_SPRAY | Freq: Two times a day (BID) | RESPIRATORY_TRACT | 0 refills | Status: DC

## 2018-05-21 MED ORDER — AEROCHAMBER MV MISC: 0 refills | Status: DC

## 2018-05-21 NOTE — Assessment & Plan Note (Signed)
She remains compliant with CPAP and benefits with better sleep. Plan-continue CPAP auto 5-15

## 2018-05-21 NOTE — Progress Notes (Signed)
Subjective:    [name], female    DOB: 1946/06/11, 72 y.o.   MRN: [id number]  HPI F never smoker followed for allergic rhinitis, rhinosinusitis, asthmatic bronchitis, ILD, complicated by , GERD, rheumatoid arthritis, ASCVD PFT 09/03/2015-mild obstructive airways disease, minimal restriction, mild diffusion defect. FVC 2.17/83%, FEV1 1.80/92%, ratio 0.83. No response to bronchodilator. TLC 78%, DLCO 73%.  CT chest High Resolution 03/14/2016 1. Mild basilar predominant subpleural reticulation and traction bronchiolectasis, grossly stable, possibly due to nonspecific interstitial pneumonitis. Usual interstitial pneumonitis is not Excluded. Nuclear stress test 05/20/2016-no evidence of significant blockage Echocardiogram 05/11/2016- WNL EF 55-60% Respiratory Viral Panel 08/01/16- POSITIVE Influenza A H3 Unattended Home Sleep Test 08/12/2016-AHI 18.2/hour, desaturation to 75% with average 88%, body weight 180 pounds CPAP titration sleep study 11/09/16.-6 cwp-  Needed supplemental oxygen because of sustained low saturation with 17.5 minutes recording saturation less than 88% on CPAP Office Spirometry 02/21/17-moderate restriction consistent with body habitus. FVC 1.70/66%, FEV1 1.49/77%, ratio 0.87, FEF 25-75% 2.39/146%. --------------------------------------------------------  01/19/2018- 72 year old female never smoker followed for allergic rhinitis, rhinosinusitis, asthmatic bronchitis, ILD, OSA,complicated by GERD/Crohn's, Rheumatoid arthritis/prednisone CPAP auto 5-15/[facility]   Seen 5/28 for r/o PNA. Rx'd prednisone, levaquin. CXR ----Bronchitis: Pt states she is having SOB and wheezing as well; worse when doing PT 2 times a  week right now.  Acute infection resolved after 12 days of Levaquin.  Now she notices wheezing and dyspnea mainly with exertion at PT.  Foot is slowly healing after latest surgery at [facility].  Needs refill on nebulizer medication-has not been using. Husband says  without CPAP she has shallow panting at night, suggestive of OHS consistent with her body habitus. No download at this visit.  They describe excellent compliance and control. CXR 12/12/17 IMPRESSION: Chronic bronchitic changes. Patchy airspace opacity on the left may reflect early pneumonia. One cannot exclude low-grade CHF in the appropriate clinical setting. Followup PA and lateral chest X-ray is recommended in 3-4 weeks following trial of antibiotic therapy to ensure resolution and exclude underlying malignancy. Thoracic aortic atherosclerosis.  05/21/2018- 72 year old female never smoker followed for allergic rhinitis, rhinosinusitis, asthmatic bronchitis, ILD, OSA,complicated by GERD/Crohn's, Rheumatoid arthritis/prednisone -----4 month follow up for asthma and OSA. Per patient she is still having issues with SOB. She has had 2 foot surgeries since her last visit. States that the [name] is working but [name] wanted to switch her to something else. Unsure of the medication name and I did not see it in his last OV.  Singulair, and Anoro, neb duoneb CPAP auto 5-15/[facility]   Download 100% compliance AHI 1.3/hour. I discussed FENO and she will try Symbicort. Discussed inhaled steroids.  We need to get her a new AeroChamber. Has been on sustained antibiotics for failed appliance in her foot. Notices some runny nose and scant bland mucus.  Wheezing has been well controlled.  Little cough.  She and her husband recognize inability to get any exercise as a substantial reason for dyspnea.  No acute events.  Cardiology continues to follow, including her pulmonary hypertension.  We reviewed her most recent chest CT and discussed implications of progressive interstitial fibrosis.  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  I would like to refer her to [name] for his opinion and advice. FENO 03/15/18  49 H CT chest Hi Res 02/02/2018- IMPRESSION: 1. 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 2. Mild patchy air trapping, unchanged, indicative of small airways disease. 3. Left main and two-vessel coronary atherosclerosis. 4. Stable dilated main pulmonary artery, suggesting chronic pulmonary arterial hypertension. Aortic Atherosclerosis (ICD10-I70.0).  ROS-see HPI   + = positive Constitutional:     weight loss, night sweats, fevers, chills, fatigue, lassitude. HEENT:   No-  headaches, difficulty swallowing, tooth/dental problems, sore throat,       No-  sneezing, itching, ear ache,  nasal congestion, +post nasal drip,  CV:  No-   chest pain, orthopnea, PND, swelling in lower extremities, anasarca,                                                     dizziness, palpitations Resp: +  shortness of breath with exertion or at rest.                productive cough, non-productive cough,  No- coughing up of blood.              change in color of mucus.  +- wheezing.   Skin: No-   rash or lesions. GI:  No-   heartburn, indigestion, abdominal pain, nausea, vomiting,  GU:  MS: +  joint pain or swelling.   Neuro-     nothing unusual Psych:  No- change in mood or affect. No depression or anxiety.  No memory loss.  Objective:  OBJ- Physical Exam  General- Alert, Oriented, Affect-appropriate, Distress- none acute, + Obese, Skin- + stasis changes  lower legs Lymphadenopathy- none Head- atraumatic            Eyes- Gross vision intact, PERRLA, conjunctivae and secretions clear            Ears- Hearing, canals-normal            Nose- Clear, no-Septal dev, mucus, polyps, erosion, perforation             Throat- Mallampati II , mucosa-clear, drainage- none, tonsils-                                               atrophic, voice  normal,  Neck- flexible , trachea midline, no stridor , thyroid nl, carotid no bruit Chest - symmetrical excursion , unlabored           Heart/CV- RRR , no murmur , no gallop  , no rub, nl s1 s2                           - JVD ? Full neck , edema- none, stasis changes+, varices- none           Lung-    clear, cough-none, unlabored, wheeze-none,   I do not hear crackles            Chest wall-  Abd-  Br/ Gen/ Rectal- Not done, not indicated Extrem-+ left foot in boot Neuro- grossly intact to observation

## 2018-05-21 NOTE — Patient Instructions (Addendum)
Order- pneumovax-23  Order- Referral Consult to Dr [name]   Dx ILD/ Rheumatoid  Sample and script Symbicort 160                  Inhale 2 puffs then rinse mouth, twice daily  Please call if we can help

## 2018-05-21 NOTE — Assessment & Plan Note (Signed)
Okay with Anoro.  Because of the elevated Fino score, we are going to try going back to a LABA/ ICS replacing Anoro with Symbicort. She needs an aerochamber.

## 2018-05-21 NOTE — Assessment & Plan Note (Signed)
Presume this is rheumatoid lung.  We are going to ask Dr. Golden Pop opinion and advice about this problem.

## 2018-05-30 ENCOUNTER — Encounter (HOSPITAL_BASED_OUTPATIENT_CLINIC_OR_DEPARTMENT_OTHER): Payer: Medicare Other | Attending: Internal Medicine

## 2018-05-30 DIAGNOSIS — T8131XA Disruption of external operation (surgical) wound, not elsewhere classified, initial encounter: Secondary | ICD-10-CM | POA: Diagnosis not present

## 2018-05-30 DIAGNOSIS — Y839 Surgical procedure, unspecified as the cause of abnormal reaction of the patient, or of later complication, without mention of misadventure at the time of the procedure: Secondary | ICD-10-CM | POA: Insufficient documentation

## 2018-05-30 DIAGNOSIS — G4730 Sleep apnea, unspecified: Secondary | ICD-10-CM | POA: Insufficient documentation

## 2018-05-30 DIAGNOSIS — Q666 Other congenital valgus deformities of feet: Secondary | ICD-10-CM | POA: Insufficient documentation

## 2018-05-30 DIAGNOSIS — I10 Essential (primary) hypertension: Secondary | ICD-10-CM | POA: Diagnosis not present

## 2018-06-06 ENCOUNTER — Encounter: Payer: Self-pay | Admitting: *Deleted

## 2018-06-08 ENCOUNTER — Telehealth: Payer: Self-pay | Admitting: Internal Medicine

## 2018-06-08 ENCOUNTER — Ambulatory Visit (INDEPENDENT_AMBULATORY_CARE_PROVIDER_SITE_OTHER): Payer: Medicare Other | Admitting: Internal Medicine

## 2018-06-08 ENCOUNTER — Encounter: Payer: Self-pay | Admitting: Internal Medicine

## 2018-06-08 DIAGNOSIS — J441 Chronic obstructive pulmonary disease with (acute) exacerbation: Secondary | ICD-10-CM | POA: Diagnosis not present

## 2018-06-08 DIAGNOSIS — J849 Interstitial pulmonary disease, unspecified: Secondary | ICD-10-CM

## 2018-06-08 DIAGNOSIS — I2510 Atherosclerotic heart disease of native coronary artery without angina pectoris: Secondary | ICD-10-CM

## 2018-06-08 MED ORDER — AMOXICILLIN-POT CLAVULANATE 875-125 MG PO TABS: 1.0000 | ORAL_TABLET | Freq: Two times a day (BID) | ORAL | 0 refills | Status: DC

## 2018-06-08 MED ORDER — FLUCONAZOLE 150 MG PO TABS: 150.0000 mg | ORAL_TABLET | Freq: Every day | ORAL | 2 refills | Status: DC

## 2018-06-08 MED ORDER — PROMETHAZINE-CODEINE 6.25-10 MG/5ML PO SYRP: ORAL_SOLUTION | ORAL | 0 refills | Status: DC

## 2018-06-08 MED ORDER — PREDNISONE 10 MG PO TABS: ORAL_TABLET | ORAL | 0 refills | Status: DC

## 2018-06-08 MED ORDER — BENZONATATE 200 MG PO CAPS: ORAL_CAPSULE | ORAL | 3 refills | Status: DC

## 2018-06-08 NOTE — Assessment & Plan Note (Signed)
Purulent exacerbation. Plan-Augmentin x10 days, Diflucan, benzonatate Perles, codeine cough syrup as discussed.

## 2018-06-08 NOTE — Progress Notes (Signed)
Subjective:    [name], female    DOB: December 27, 1945, 72 y.o.   MRN: [id number]  HPI F never smoker followed for allergic rhinitis, rhinosinusitis, asthmatic bronchitis, ILD, complicated by , GERD, rheumatoid arthritis, ASCVD PFT 09/03/2015-mild obstructive airways disease, minimal restriction, mild diffusion defect. FVC 2.17/83%, FEV1 1.80/92%, ratio 0.83. No response to bronchodilator. TLC 78%, DLCO 73%.  CT chest High Resolution 03/14/2016 1. Mild basilar predominant subpleural reticulation and traction bronchiolectasis, grossly stable, possibly due to nonspecific interstitial pneumonitis. Usual interstitial pneumonitis is not Excluded. Nuclear stress test 05/20/2016-no evidence of significant blockage Echocardiogram 05/11/2016- WNL EF 55-60% Respiratory Viral Panel 08/01/16- POSITIVE Influenza A H3 Unattended Home Sleep Test 08/12/2016-AHI 18.2/hour, desaturation to 75% with average 88%, body weight 180 pounds CPAP titration sleep study 11/09/16.-6 cwp-  Needed supplemental oxygen because of sustained low saturation with 17.5 minutes recording saturation less than 88% on CPAP Office Spirometry 02/21/17-moderate restriction consistent with body habitus. FVC 1.70/66%, FEV1 1.49/77%, ratio 0.87, FEF 25-75% 2.39/146%. FENO 03/15/18  49 H --------------------------------------------------------  05/21/2018- 72 year old female never smoker followed for allergic rhinitis, rhinosinusitis, asthmatic bronchitis, ILD, OSA,complicated by GERD/Crohn's, Rheumatoid arthritis/prednisone -----4 month follow up for asthma and OSA. Per patient she is still having issues with SOB. She has had 2 foot surgeries since her last visit. States that the [name] is working but [name] wanted to switch her to something else. Unsure of the medication name and I did not see it in his last OV.  Singulair, and Anoro, neb duoneb CPAP auto 5-15/[facility]   Download 100% compliance AHI 1.3/hour. I discussed FENO and  she will try Symbicort. Discussed inhaled steroids.  We need to get her a new AeroChamber. Has been on sustained antibiotics for failed appliance in her foot. Notices some runny nose and scant bland mucus.  Wheezing has been well controlled.  Little cough.  She and her husband recognize inability to get any exercise as a substantial reason for dyspnea.  No acute events.  Cardiology continues to follow, including her pulmonary hypertension.  We reviewed her most recent chest CT and discussed implications of progressive interstitial fibrosis.  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  I would like to refer her to [name] for his opinion and advice. FENO 03/15/18  49 H CT chest Hi Res 02/02/2018- IMPRESSION: 1. Spectrum of findings compatible with fibrotic interstitial lung disease with a slight basilar predominance, with mild progression since 2017. 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 2. Mild patchy air trapping, unchanged, indicative of small airways disease. 3. Left main and two-vessel coronary atherosclerosis. 4. Stable dilated main pulmonary artery, suggesting chronic pulmonary arterial hypertension. Aortic Atherosclerosis (ICD10-I70.0).  06/08/18- 72 year old female never smoker followed for allergic rhinitis, rhinosinusitis, asthmatic bronchitis, ILD, OSA,complicated by GERD/Crohn's, Rheumatoid arthritis/prednisone -----coughing for 2 weeks, yellow, green mucus, SOB  Singulair, Atrovent nasal spray, Symbicort 160, and Anoro, neb albuterol, neb DuoNeb, Complains of increased cough with chest congestion, green/yellow sputum, low-grade fever 99 degrees over the last 2 weeks.  Not on prednisone now.  Last antibiotic in October. Pending ILD consultation with [name] in December.  ROS-see HPI   + = positive Constitutional:     weight loss, night  sweats, fevers, chills, fatigue, lassitude. HEENT:   No-  headaches, difficulty swallowing, tooth/dental problems, sore throat,       No-  sneezing, itching, ear ache,  nasal congestion, +post  nasal drip,  CV:  No-   chest pain, orthopnea, PND, swelling in lower extremities, anasarca,                                                     dizziness, palpitations Resp: +  shortness of breath with exertion or at rest.                +productive cough, non-productive cough,  No- coughing up of blood.              +change in color of mucus.  +- wheezing.   Skin: No-   rash or lesions. GI:  No-   heartburn, indigestion, abdominal pain, nausea, vomiting,  GU:  MS: +  joint pain or swelling.   Neuro-     nothing unusual Psych:  No- change in mood or affect. No depression or anxiety.  No memory loss.  Objective:  OBJ- Physical Exam  General- Alert, Oriented, Affect-appropriate, Distress- none acute, + Obese, Skin- + stasis changes  lower legs Lymphadenopathy- none Head- atraumatic            Eyes- Gross vision intact, PERRLA, conjunctivae and secretions clear            Ears- Hearing, canals-normal            Nose- Clear, no-Septal dev, mucus, polyps, erosion, perforation             Throat- Mallampati II , mucosa-clear, drainage- none, tonsils-                                               atrophic, voice normal,  Neck- flexible , trachea midline, no stridor , thyroid nl, carotid no bruit Chest - symmetrical excursion , unlabored           Heart/CV- RRR , no murmur , no gallop  , no rub, nl s1 s2                           - JVD ? Full neck , edema- none, stasis changes+, varices- none           Lung-    , cough+, unlabored, wheeze-none,              Chest wall-  Abd-  Br/ Gen/ Rectal- Not done, not indicated Extrem-+ left foot in boot (still) Neuro- grossly intact to observation

## 2018-06-08 NOTE — Patient Instructions (Signed)
Scripts printed for benzonatate perles and cough syrup  Scripts sent for Diflucan, augmentin and prednisone  I hope this helps

## 2018-06-08 NOTE — Assessment & Plan Note (Signed)
This is presumed to be related to her Rheumatoid disease.  She has a pending appointment with with [name] for his ILD expertise.

## 2018-06-11 NOTE — Telephone Encounter (Signed)
Error.

## 2018-06-13 DIAGNOSIS — T8131XA Disruption of external operation (surgical) wound, not elsewhere classified, initial encounter: Secondary | ICD-10-CM | POA: Diagnosis not present

## 2018-06-27 ENCOUNTER — Encounter (HOSPITAL_BASED_OUTPATIENT_CLINIC_OR_DEPARTMENT_OTHER): Payer: Medicare Other | Attending: Physician Assistant

## 2018-06-27 ENCOUNTER — Other Ambulatory Visit: Payer: Self-pay | Admitting: Internal Medicine

## 2018-06-27 DIAGNOSIS — G4730 Sleep apnea, unspecified: Secondary | ICD-10-CM | POA: Diagnosis not present

## 2018-06-27 DIAGNOSIS — L97522 Non-pressure chronic ulcer of other part of left foot with fat layer exposed: Secondary | ICD-10-CM | POA: Insufficient documentation

## 2018-06-27 DIAGNOSIS — I10 Essential (primary) hypertension: Secondary | ICD-10-CM | POA: Diagnosis not present

## 2018-06-27 DIAGNOSIS — Q666 Other congenital valgus deformities of feet: Secondary | ICD-10-CM | POA: Insufficient documentation

## 2018-07-02 ENCOUNTER — Telehealth: Payer: Self-pay | Admitting: Internal Medicine

## 2018-07-02 ENCOUNTER — Other Ambulatory Visit (INDEPENDENT_AMBULATORY_CARE_PROVIDER_SITE_OTHER): Payer: Medicare Other

## 2018-07-02 ENCOUNTER — Ambulatory Visit (INDEPENDENT_AMBULATORY_CARE_PROVIDER_SITE_OTHER): Payer: Medicare Other | Admitting: Pulmonary Disease

## 2018-07-02 ENCOUNTER — Encounter: Payer: Self-pay | Admitting: Pulmonary Disease

## 2018-07-02 ENCOUNTER — Ambulatory Visit (INDEPENDENT_AMBULATORY_CARE_PROVIDER_SITE_OTHER)
Admission: RE | Admit: 2018-07-02 | Discharge: 2018-07-02 | Disposition: A | Discharge status: Home / Self Care | Payer: Medicare Other | Source: Ambulatory Visit | Attending: Pulmonary Disease | Admitting: Pulmonary Disease

## 2018-07-02 ENCOUNTER — Ambulatory Visit: Payer: Medicare Other | Admitting: Pulmonary Disease

## 2018-07-02 VITALS — BP 110/78 | HR 80 | Temp 98.2°F | Ht 61.0 in | Wt 203.4 lb

## 2018-07-02 DIAGNOSIS — J849 Interstitial pulmonary disease, unspecified: Secondary | ICD-10-CM | POA: Diagnosis not present

## 2018-07-02 DIAGNOSIS — G4733 Obstructive sleep apnea (adult) (pediatric): Secondary | ICD-10-CM

## 2018-07-02 DIAGNOSIS — R0602 Shortness of breath: Secondary | ICD-10-CM

## 2018-07-02 DIAGNOSIS — J441 Chronic obstructive pulmonary disease with (acute) exacerbation: Secondary | ICD-10-CM

## 2018-07-02 LAB — BASIC METABOLIC PANEL
BUN: 15 mg/dL (ref 6–23)
CO2: 26 mEq/L (ref 19–32)
Calcium: 10.2 mg/dL (ref 8.4–10.5)
Chloride: 105 mEq/L (ref 96–112)
Creatinine, Ser: 1.03 mg/dL (ref 0.40–1.20)
GFR: 55.64 mL/min — ABNORMAL LOW (ref >60.00)
Glucose, Bld: 113 mg/dL — ABNORMAL HIGH (ref 70–99)
Potassium: 3.8 mEq/L (ref 3.5–5.1)
Sodium: 141 mEq/L (ref 135–145)

## 2018-07-02 MED ORDER — MONTELUKAST SODIUM 10 MG PO TABS: 10.0000 mg | ORAL_TABLET | Freq: Every day | ORAL | 1 refills | Status: DC

## 2018-07-02 MED ORDER — ALBUTEROL SULFATE HFA 108 (90 BASE) MCG/ACT IN AERS: 2.0000 | INHALATION_SPRAY | Freq: Four times a day (QID) | RESPIRATORY_TRACT | 2 refills | Status: DC | PRN

## 2018-07-02 MED ORDER — IPRATROPIUM-ALBUTEROL 0.5-2.5 (3) MG/3ML IN SOLN
3.0000 mL | Freq: Once | RESPIRATORY_TRACT | Status: AC
  Administered 2018-07-02: 3 mL via RESPIRATORY_TRACT

## 2018-07-02 MED ORDER — DOXYCYCLINE HYCLATE 100 MG PO TABS: 100.0000 mg | ORAL_TABLET | Freq: Two times a day (BID) | ORAL | 0 refills | Status: DC

## 2018-07-02 NOTE — Assessment & Plan Note (Signed)
Lab work today  Chest x-ray today  Duoneb today   Doxycycline >>> 1 100 mg tablet every 12 hours for 7 days >>>take with food  >>>wear sunscreen  >>>start daily probiotic   Please proceed to our [facility] office and go to the basement to receive your lab work as well as chest x-ray    We will send you home on oxygen today 2L at rest and 4 L with exertion >>>maintain oxygen saturations greater than 88 percent  >>>if unable to maintain oxygen saturations please contact the office  >>>do not smoke with oxygen  >>>can use nasal saline gel or nasal saline rinses to moisturize nose if oxygen causes dryness    Continue Anoro Ellipta  >>> Take 1 puff daily in the morning right when you wake up >>>Rinse your mouth out after use >>>This is a daily maintenance inhaler, NOT a rescue inhaler >>>Contact our office if you are having difficulties affording or obtaining this medication >>>It is important for you to be able to take this daily and not miss any doses  Only use your albuterol as a rescue medication to be used if you can't catch your breath by resting or doing a relaxed purse lip breathing pattern.  - The less you use it, the better it will work when you need it. - Ok to use up to 2 puffs  every 4 hours if you must but call for immediate appointment if use goes up over your usual need - Don't leave home without it !!  (think of it like the spare tire for your car)   Can use DuoNeb nebulized medications every 6 hours as needed for shortness of breath or wheezing  Keep follow up with Dr. Annamaria Boots in March/2020

## 2018-07-02 NOTE — Assessment & Plan Note (Signed)
  We recommend that you continue using your CPAP daily >>>Keep up the hard work using your device >>> Goal should be wearing this for the entire night that you are sleeping, at least 4 to 6 hours  Remember:  . Do not drive or operate heavy machinery if tired or drowsy.  . Please notify the supply company and office if you are unable to use your device regularly due to missing supplies or machine being broken.  . Work on maintaining a healthy weight and following your recommended nutrition plan  . Maintain proper daily exercise and movement  . Maintaining proper use of your device can also help improve management of other chronic illnesses such as: Blood pressure, blood sugars, and weight management.   BiPAP/ CPAP Cleaning:  >>>Clean weekly, with Dawn soap, and bottle brush.  Set up to air dry.  Keep follow up with Dr. Annamaria Boots in March/2020

## 2018-07-02 NOTE — Progress Notes (Signed)
Stable chest xray today. 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   Wyn Quaker FNP

## 2018-07-02 NOTE — Assessment & Plan Note (Signed)
  We will send you home on oxygen today 2L at rest and 4 L with exertion >>>maintain oxygen saturations greater than 88 percent  >>>if unable to maintain oxygen saturations please contact the office  >>>do not smoke with oxygen  >>>can use nasal saline gel or nasal saline rinses to moisturize nose if oxygen causes dryness  We will refer to pulmonary rehab  Keep follow up with [name] in JAN/2020

## 2018-07-02 NOTE — Patient Instructions (Addendum)
Lab work today  Chest x-ray today  Duoneb today   Doxycycline >>> 1 100 mg tablet every 12 hours for 7 days >>>take with food  >>>wear sunscreen  >>>start daily probiotic   Please proceed to our [facility] office and go to the basement to receive your lab work as well as chest x-ray    We will send you home on oxygen today 2L at rest and 4 L with exertion >>>maintain oxygen saturations greater than 88 percent  >>>if unable to maintain oxygen saturations please contact the office  >>>do not smoke with oxygen  >>>can use nasal saline gel or nasal saline rinses to moisturize nose if oxygen causes dryness    We will refer to pulmonary rehab     Continue Anoro Ellipta  >>> Take 1 puff daily in the morning right when you wake up >>>Rinse your mouth out after use >>>This is a daily maintenance inhaler, NOT a rescue inhaler >>>Contact our office if you are having difficulties affording or obtaining this medication >>>It is important for you to be able to take this daily and not miss any doses  Only use your albuterol as a rescue medication to be used if you can't catch your breath by resting or doing a relaxed purse lip breathing pattern.  - The less you use it, the better it will work when you need it. - Ok to use up to 2 puffs  every 4 hours if you must but call for immediate appointment if use goes up over your usual need - Don't leave home without it !!  (think of it like the spare tire for your car)   Can use DuoNeb nebulized medications every 6 hours as needed for shortness of breath or wheezing    We recommend that you continue using your CPAP daily >>>Keep up the hard work using your device >>> Goal should be wearing this for the entire night that you are sleeping, at least 4 to 6 hours  Remember:  . Do not drive or operate heavy machinery if tired or drowsy.  . Please notify the supply company and office if you are unable to use your device regularly due to missing  supplies or machine being broken.  . Work on maintaining a healthy weight and following your recommended nutrition plan  . Maintain proper daily exercise and movement  . Maintaining proper use of your device can also help improve management of other chronic illnesses such as: Blood pressure, blood sugars, and weight management.   BiPAP/ CPAP Cleaning:  >>>Clean weekly, with Dawn soap, and bottle brush.  Set up to air dry.    Keep follow up with [name] in JAN/2020 Keep follow up with Dr. Annamaria Boots in March/2020   It is flu season:   >>>Remember to be washing your hands regularly, using hand sanitizer, be careful to use around herself with has contact with people who are sick will increase her chances of getting sick yourself. >>> Best ways to protect herself from the flu: Receive the yearly flu vaccine, practice good hand hygiene washing with soap and also using hand sanitizer when available, eat a nutritious meals, get adequate rest, hydrate appropriately   Please contact the office if your symptoms worsen or you have concerns that you are not improving.   Thank you for choosing [facility] for your healthcare, and for allowing [place] to partner with you on your healthcare journey. I am thankful to be able to provide care to  you today.   Wyn Quaker FNP-C      Home Oxygen Use, Adult When a medical condition keeps you from getting enough oxygen, your health care provider may instruct you to take extra oxygen at home. Your health care provider will let you know:  When to take oxygen.  For how long to take oxygen.  How quickly oxygen should be delivered (flow rate), in liters per minute (LPM or L/M).  Home oxygen can be given through:  A mask.  A nasal cannula. This is a device or tube that goes in the nostrils.  A transtracheal catheter. This is a small, flexible tube placed in the trachea.  A tracheostomy. This is a surgically made opening in the  trachea.  These devices are connected with tubing to an oxygen source, such as:  A tank. Tanks hold oxygen in gas form. They must be replaced when the oxygen is used up.  A liquid oxygen device. This holds oxygen in liquid form. It must be replaced when the oxygen is used up.  An oxygen concentrator machine. This filters oxygen in the room. It uses electricity, so you must have a backup cylinder of oxygen in case the power goes out.  Supplies needed: To use oxygen, you will need:  A mask, nasal cannula, transtracheal catheter, or tracheostomy.  An oxygen tank, a liquid oxygen device, or an oxygen concentrator.  The tape that your health care provider recommends (optional).  If you use a transtracheal catheter and your prescribed flow rate is 1 LPM or greater, you will also need a humidifier. Risks and complications  Fire. This can happen if the oxygen is exposed to a heat source, flame, or spark.  Injury to skin. This can happen if liquid oxygen touches your skin.  Organ damage. This can happen if you get too little oxygen. How to use oxygen Your health care provider will show you how to use your oxygen device. Follow her or his instructions. They may look something like this: 1. Wash your hands. 2. If you use an oxygen concentrator, make sure it is plugged in. 3. Place one end of the tube into the port on the tank, device, or machine. 4. Place the mask over your nose and mouth. Or, place the nasal cannula and secure it with tape if instructed. If you use a tracheostomy or transtracheal catheter, connect it to the oxygen source as directed. 5. Make sure the liter-flow setting on the machine is at the level prescribed by your health care provider. 6. Turn on the machine or adjust the knob on the tank or device to the correct liter-flow setting. 7. When you are done, turn off and unplug the machine, or turn the knob to OFF.  How to clean and care for the oxygen supplies Nasal  cannula  Clean it with a warm, wet cloth daily or as needed.  Wash it with a liquid soap once a week.  Rinse it thoroughly once or twice a week.  Replace it every 2-4 weeks.  If you have an infection, such as a cold or pneumonia, change the cannula when you get better. Mask  Replace it every 2-4 weeks.  If you have an infection, such as a cold or pneumonia, change the mask when you get better. Humidifier bottle  Wash the bottle between each refill: ? Wash it with soap and warm water. ? Rinse it thoroughly. ? Disinfect it and its top. ? Air-dry it.  Make sure it is  dry before you refill it. Oxygen concentrator  Clean the air filter at least twice a week according to directions from your home medical equipment and service company.  Wipe down the cabinet every day. To do this: ? Unplug the unit. ? Wipe down the cabinet with a damp cloth. ? Dry the cabinet. Other equipment  Change any extra tubing every 1-3 months.  Follow instructions from your health care provider about taking care of any other equipment. Safety tips Fire safety tips   Keep your oxygen and oxygen supplies at least 5 ft away from sources of heat, flames, and sparks at all times.  Do not allow smoking near your oxygen. Put up "no smoking" signs in your home.  Do not use materials that can burn (are flammable) while you use oxygen.  When you go to a restaurant with portable oxygen, ask to be seated in the nonsmoking section.  Keep a Data processing manager close by. Let your fire department know that you have oxygen in your home.  Test your home smoke detectors regularly. General safety tips  If you use an oxygen cylinder, make sure it is in a stand or secured to an object that will not move (fixed object).  If you use liquid oxygen, make sure its container is kept upright.  If you use an oxygen concentrator: ? [employer] company. Make sure you are given priority service in the event that your  power goes out. ? Avoid using extension cords, if possible. Follow these instructions at home:  Use oxygen only as told by your health care provider.  Do not use alcohol or other drugs that make you relax (sedating drugs) unless instructed. They can slow down your breathing rate and make it hard to get in enough oxygen.  Know how and when to order a refill of oxygen.  Always keep a spare tank of oxygen. Plan ahead for holidays when you may not be able to get a prescription filled.  Use water-based lubricants on your lips or nostrils. Do not use oil-based products like petroleum jelly.  To prevent skin irritation on your cheeks or behind your ears, tuck some gauze under the tubing. Contact a health care provider if:  You get headaches often.  You have shortness of breath.  You have a lasting cough.  You have anxiety.  You are sleepy all the time.  You develop an illness that affects your breathing.  You cannot exercise at your regular level.  You are restless.  You have difficult or irregular breathing, and it is getting worse.  You have a fever.  You have persistent redness under your nose. Get help right away if:  You are confused.  You have blue lips or fingernails.  You are struggling to breathe. This information is not intended to replace advice given to you by your health care provider. Make sure you discuss any questions you have with your health care provider. Document Released: 09/24/2003 Document Revised: 03/02/2016 Document Reviewed: 01/26/2016 Elsevier Interactive Patient Education  [employer].

## 2018-07-02 NOTE — Progress Notes (Addendum)
_0  ID: [name], female    DOB: 07-26-1945, 72 y.o.   MRN: [id number]  Chief Complaint  Patient presents with  . Acute Visit    sob, cough    Referring provider: Leonard Downing, *  HPI:  72 year old female never smoker followed in our office for allergic rhinitis, rhinosinusitis, asthmatic bronchitis, ILD  PMH: GERD, rheumatoid arthritis, ASCVD Smoker/ Smoking History: Never smoker Maintenance: Plaquenil, Anoro Ellipta Pt of: Dr. Annamaria Boots  07/02/2018  - Visit   72 year old female patient presenting today for acute visit.  Patient was recently treated with a course of Augmentin and reports sputum color is now light green but has decreased in sputum amount since last office visit.  Patient does not feel she is getting better.  Patient remains adherent to Anoro Ellipta.  On arrival to our office today patient was short of breath and oxygen saturations were 81%.  With deep breathing patient was able to increase oxygenation to 85%.  When placed on 2 L O2 patient was able to maintain at 91%.  Patient had previously been on oxygen in 2015 but this was later [facility].  Patient's weight has also increased over the previous office visits, patient remains adherent to diuretic treatment plan as on taking 1 diuretic daily.  Patient reports she is allowed to take an additional diuretic when she feels necessary.  Unfortunately patient has not been weighing herself regularly there is no set plan to when she should take her second diuretic.  Patient is exceptionally sedentary at home and rarely gets out of her bed or lazy boy chair which is about 3 feet from her bed.  Patient's husband takes care of patient completely including getting drinks and food so patient does not have to leave her lazy boy recliner.  Patient watches many television shows and does not report much activity.   Patient has completed follow-up with rheumatology last week and no changes to her medications were made.  Patient  reports that she was stable at the time.  CPAP compliance report showing 30 out of 30 days used.  All 30 those days greater than 4 hours.  APAP settings 5-15.  Average usage days 7 hours and 56 minutes.  AHI 0.8.   Tests:   PFT 09/03/2015-mild obstructive airways disease, minimal restriction, mild diffusion defect. FVC 2.17/83%, FEV1 1.80/92%, ratio 0.83. No response to bronchodilator. TLC 78%, DLCO 73%. Office Spirometry 02/21/17-moderate restriction consistent with body habitus. FVC 1.70/66%, FEV1 1.49/77%, ratio 0.87, FEF 25-75% 2.39/146%.  Unattended Home Sleep Test 08/12/2016-AHI 18.2/hour, desaturation to 75% with average 88%, body weight 180 pounds CPAP titration sleep study 11/09/16.-6 cwp-  Needed supplemental oxygen because of sustained low saturation with 17.5 minutes recording saturation less than 88% on CPAP  Imaging:   12/30/2016-chest x-ray- no active cardiopulmonary disease 03/14/2016-CT chest high-res- mild basilar predominant subpleural reticulation and traction bronchiolectasis, scattered tiny pulmonary nodules measuring 4 mm or less  Cardiac:  Nuclear stress test 05/20/2016-no evidence of significant blockage Echocardiogram 05/11/2016- WNL EF 00-17%, grade 1 diastolic dysfunction  Micro: 09/07/2016-sputum culture-abundant gram-positive cocci in pairs and clusters, scant growth of yeast   Labs:  Respiratory Viral Panel 08/01/16- POSITIVE Influenza A H3  FENO:  No results found for: NITRICOXIDE  PFT: PFT Results Latest Ref Rng & Units 09/03/2015  FVC-Pre L 2.18  FVC-Predicted Pre % 84  FVC-Post L 2.17  FVC-Predicted Post % 83  Pre FEV1/FVC % % 79  Post FEV1/FCV % % 83  FEV1-Pre L 1.73  FEV1-Predicted Pre % 88  FEV1-Post L 1.80  DLCO UNC% % 73  DLCO COR %Predicted % 94  TLC L 3.63  TLC % Predicted % 78  RV % Predicted % 61    Imaging: No results found.  Chart Review:    Specialty Problems      Pulmonary Problems   ASTHMATIC BRONCHITIS, ACUTE     Qualifier: Diagnosis of  By: Annamaria Boots MD, [place] D       Seasonal and perennial allergic rhinitis    Allergy vaccine 1:50,000 07/13/00; 1:10 06/11/08 GO       RHINOSINUSITIS, CHRONIC    Qualifier: Diagnosis of  By: Annamaria Boots MD, Clinton D       Asthmatic bronchitis, moderate persistent, uncomplicated    Office Spirometry 02/21/17-moderate restriction consistent with body habitus. FVC 1.70/66%, FEV1 1.49/77%, ratio 0.87, FEF 25-75% 2.39/146%.  03/15/18 - FENO - 49       Bronchitis, chronic obstructive, with exacerbation ([place])    Qualifier: Diagnosis of  By: Annamaria Boots MD, Clinton D       CAP (community acquired pneumonia)   Hypoxia   Influenza A with pneumonia   Interstitial lung disease ([place])    HRCT chest 08/27/2015, 03/14/16-changes grossly stable CT chest Hi Res 02/02/2018-mild progression       Obstructive sleep apnea    Unattended Home Sleep Test 08/12/2016-AHI 18.2/hour, desaturation to 75% with average 88%, body weight 180 pounds CPAP titration sleep study 11/09/16.-6 cwp-  Needed supplemental oxygen because of sustained low saturation with 17.5 minutes recording saturation less than 88% on CPAP      Shortness of breath      Allergies  Allergen Reactions  . Cefdinir Diarrhea  . Erythromycin Base Diarrhea    Other  . Lactose Intolerance (Gi) Diarrhea  . Lactulose Diarrhea  . Mesalamine Nausea Only  . Nitrofuran Derivatives     shakiness  . Nitrofurantoin Other (See Comments)    shakiness other  . Other Diarrhea and Other (See Comments)    Shaking uncontrollably "lettuce only" "lettuce only"  . Sulfa Antibiotics Other (See Comments)    Patient can't recall reaction   . Tdap [Tetanus-Diphth-Acell Pertussis]     Shaking uncontrollably  . Tetanus Toxoid, Adsorbed Other (See Comments)    Shaking uncontrollable     Immunization History  Administered Date(s) Administered  . Influenza Split 03/18/2012, 03/18/2013, 04/14/2014, 04/17/2017  . Influenza Whole  04/30/2008, 04/20/2009, 04/18/2011  . Influenza, High Dose Seasonal PF 04/23/2015, 03/24/2016, 04/06/2018  . Pneumococcal Conjugate-13 11/11/2013  . Pneumococcal Polysaccharide-23 05/21/2018  . Zoster Recombinat (Shingrix) 05/05/2017    Past Medical History:  Diagnosis Date  . Acute asthmatic bronchitis   . Allergic rhinitis   . Esophageal reflux   . Irritable bowel syndrome   . Rheumatoid arthritis(714.0)     Tobacco History: Social History   Tobacco Use  Smoking Status Never Smoker  Smokeless Tobacco Never Used  Tobacco Comment   positive passive tobacco smoke exposure   Counseling given: Yes Comment: positive passive tobacco smoke exposure  Continue to not smoke   Outpatient Encounter Medications as of 07/02/2018  Medication Sig  . acetaminophen (TYLENOL) 500 MG tablet Take 2 tablets by mouth every 6 (six) hours.  . ALPRAZolam (XANAX) 0.5 MG tablet Take 0.5 mg by mouth as needed.  [name] ELLIPTA 62.5-25 MCG/INH AEPB USE 1 INHALATION DAILY  . aspirin EC 81 MG tablet Take 81 mg by mouth daily.  . benzonatate (TESSALON) 200 MG capsule TAKE 1  CAPSULE BY MOUTH 3 TIMES A DAY AS NEEDED FOR COUGH  . cetirizine (ZYRTEC) 10 MG tablet Take 10 mg by mouth daily.  . clidinium-chlordiazePOXIDE (LIBRAX) 5-2.5 MG capsule Take 1 capsule by mouth 2 (two) times daily as needed (for IBS).  . colestipol (COLESTID) 1 G tablet Take 1 g by mouth 2 (two) times daily.   . [facility] D3 5000 units capsule Take 5,000 Units by mouth daily.  . fluconazole (DIFLUCAN) 150 MG tablet Take 1 tablet (150 mg total) by mouth daily.  . fluconazole (DIFLUCAN) 150 MG tablet Take 1 tablet (150 mg total) by mouth daily.  Marland Kitchen FLUoxetine (PROZAC) 20 MG capsule Take 20 mg by mouth daily.  . fluticasone (FLONASE) 50 MCG/ACT nasal spray Place 2 sprays into the nose daily.   . furosemide (LASIX) 40 MG tablet TAKE 1 TABLET BY MOUTH TWO  TIMES DAILY  . glycopyrrolate (ROBINUL) 1 MG tablet Take 1 mg by mouth 3 (three) times  daily as needed (for IBS).   . hydroxychloroquine (PLAQUENIL) 200 MG tablet Take 600 mg by mouth daily.   . hydroxypropyl methylcellulose / hypromellose (ISOPTO TEARS / GONIOVISC) 2.5 % ophthalmic solution Place 1-2 drops into both eyes 3 (three) times daily as needed for dry eyes.  Marland Kitchen ipratropium (ATROVENT) 0.03 % nasal spray USE 1 TO 2 SPRAYS INTO BOTH NOSTRILS 2 TIMES DAILY.  Marland Kitchen ipratropium-albuterol (DUONEB) 0.5-2.5 (3) MG/3ML SOLN Take 3 mLs by nebulization every 6 (six) hours as needed.  . leflunomide (ARAVA) 20 MG tablet Take 20 mg by mouth daily.   . mirtazapine (REMERON) 45 MG tablet Take 45 mg by mouth at bedtime.  . montelukast (SINGULAIR) 10 MG tablet Take 1 tablet (10 mg total) by mouth at bedtime.  . Multiple Vitamin (MULTIVITAMIN) tablet Take 1 tablet by mouth daily.  Marland Kitchen omeprazole (PRILOSEC) 40 MG capsule Take 40 mg by mouth daily.  . potassium chloride SA (K-DUR,KLOR-CON) 20 MEQ tablet TAKE 1 TABLET BY MOUTH 3  TIMES DAILY  . promethazine-codeine (PHENERGAN WITH CODEINE) 6.25-10 MG/5ML syrup Take 5 mLs by mouth every 12 hours as needed for up to 10 days for cough.  . rosuvastatin (CRESTOR) 20 MG tablet TAKE 1 TABLET BY MOUTH  DAILY  . Spacer/Aero-Holding Chambers (AEROCHAMBER MV) inhaler Use as instructed  . traMADol (ULTRAM) 50 MG tablet Take 50 mg by mouth 2 (two) times daily.   . verapamil (CALAN-SR) 240 MG CR tablet Take 240 mg by mouth daily.  . [DISCONTINUED] montelukast (SINGULAIR) 10 MG tablet TAKE 1 TABLET BY MOUTH AT  BEDTIME  . albuterol (PROVENTIL HFA;VENTOLIN HFA) 108 (90 Base) MCG/ACT inhaler Inhale 2 puffs into the lungs every 6 (six) hours as needed.  . doxycycline (VIBRA-TABS) 100 MG tablet Take 1 tablet (100 mg total) by mouth 2 (two) times daily.  . [DISCONTINUED] albuterol (PROVENTIL HFA;VENTOLIN HFA) 108 (90 Base) MCG/ACT inhaler Inhale 2 puffs into the lungs every 6 (six) hours as needed.  . [DISCONTINUED] amoxicillin-clavulanate (AUGMENTIN) 875-125 MG tablet  Take 1 tablet by mouth 2 (two) times daily. (Patient not taking: Reported on 07/02/2018)  . [DISCONTINUED] predniSONE (DELTASONE) 10 MG tablet 4 X 2 DAYS, 3 X 2 DAYS, 2 X 2 DAYS, 1 X 2 DAYS (Patient not taking: Reported on 07/02/2018)  . [EXPIRED] ipratropium-albuterol (DUONEB) 0.5-2.5 (3) MG/3ML nebulizer solution 3 mL    No facility-administered encounter medications on file as of 07/02/2018.      Review of Systems  Review of Systems  Constitutional: Positive for fatigue.  Negative for chills, fever and unexpected weight change.  HENT: Negative for congestion, ear pain, postnasal drip, sinus pressure and sinus pain.   Respiratory: Positive for cough and shortness of breath. Negative for chest tightness and wheezing.   Cardiovascular: Negative for chest pain, palpitations and leg swelling.  Gastrointestinal: Negative for blood in stool, diarrhea, nausea and vomiting.  Genitourinary: Negative for dysuria, frequency and urgency.  Musculoskeletal: Negative for arthralgias.       +left foot wound, followed with wound care  Skin: Negative for color change.  Allergic/Immunologic: Negative for environmental allergies and food allergies.  Neurological: Negative for dizziness, light-headedness and headaches.  Psychiatric/Behavioral: Negative for dysphoric mood. The patient is not nervous/anxious.   All other systems reviewed and are negative.    Physical Exam  BP 110/78 (BP Location: Left Arm, Cuff Size: Normal)   Pulse 80   Temp 98.2 F (36.8 C) (Oral)   Ht _0  (1.549 m)   Wt 203 lb 6.4 oz (92.3 kg)   SpO2 93%   BMI 38.43 kg/m   Wt Readings from Last 5 Encounters:  07/02/18 203 lb 6.4 oz (92.3 kg)  06/08/18 202 lb (91.6 kg)  05/21/18 202 lb 12.8 oz (92 kg)  05/09/18 198 lb (89.8 kg)  03/15/18 199 lb 6.4 oz (90.4 kg)    Physical Exam  Constitutional: She is oriented to person, place, and time and well-developed, well-nourished, and in no distress. No distress.  HENT:  Head:  Normocephalic and atraumatic.  Right Ear: Hearing, tympanic membrane, external ear and ear canal normal.  Left Ear: Hearing, tympanic membrane, external ear and ear canal normal.  Nose: Mucosal edema present. Right sinus exhibits no maxillary sinus tenderness and no frontal sinus tenderness. Left sinus exhibits no maxillary sinus tenderness and no frontal sinus tenderness.  Mouth/Throat: Uvula is midline and oropharynx is clear and moist. No oropharyngeal exudate.  + Hearing aids bilaterally, TMs with effusion without infection bilaterally, postnasal drip  Eyes: Pupils are equal, round, and reactive to light.  Neck: Normal range of motion. Neck supple. No JVD present.  Cardiovascular: Normal rate, regular rhythm and normal heart sounds.  Pulmonary/Chest: Effort normal. No accessory muscle usage. No respiratory distress. She has no decreased breath sounds. She has wheezes (Upper lobe exp wheezes bilaterally). She has no rhonchi.  Abdominal: Soft. Bowel sounds are normal. There is no abdominal tenderness.  Musculoskeletal:        General: Edema (Trace lower extremity edema bilaterally) present.     Left ankle: She exhibits decreased range of motion.     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  Neurological: She is alert and oriented to person, place, and time. Gait normal.  Skin: Skin is warm and dry. She is not diaphoretic. No erythema.     Psychiatric: Memory, affect and judgment normal. Her mood appears anxious.  Nursing note and vitals reviewed.   Duo neb  Resp: almost completely resolved exp wheeze   Lab Results:  CBC    Component Value Date/Time   WBC 9.2 09/23/2016 1252   WBC 11.5 (H) 01/21/2016 1129   RBC 5.08 09/23/2016 1252   RBC 4.77 01/21/2016 1129   HGB 14.4 09/23/2016 1252   HCT 44.7 09/23/2016 1252   PLT 342 09/23/2016 1252   MCV 88 09/23/2016 1252   MCH 28.3 09/23/2016 1252    MCH 29.6 08/03/2013 0548   MCHC 32.2  09/23/2016 1252   MCHC 33.2 01/21/2016 1129   RDW 15.4 09/23/2016 1252   LYMPHSABS 1.2 09/23/2016 1252   MONOABS 0.8 01/21/2016 1129   EOSABS 0.0 09/23/2016 1252   BASOSABS 0.0 09/23/2016 1252    BMET    Component Value Date/Time   NA 142 01/09/2017 1024   K 4.3 01/09/2017 1024   CL 105 01/09/2017 1024   CO2 23 01/09/2017 1024   GLUCOSE 86 01/09/2017 1024   GLUCOSE 72 05/30/2016 1002   BUN 18 01/09/2017 1024   CREATININE 0.92 01/09/2017 1024   CREATININE 0.91 05/30/2016 1002   CALCIUM 10.3 01/09/2017 1024   GFRNONAA 62 01/09/2017 1024   GFRAA 72 01/09/2017 1024    BNP No results found for: BNP  ProBNP    Component Value Date/Time   PROBNP 20.0 01/21/2016 1129      Assessment & Plan:   Pleasant 72 year old female patient completing acute visit with our office today.  Patient to be started on oxygen today.  Patient needed 2L O2 at rest and 4 L O2 with exertion.  Will send prescription for nebulizer to [facility] as this was never completed via [facility].  Patient will need CPAP tubing to allow 2 L of O2 bled in at night.  Patient to keep follow-up with [name] for ILD evaluation in January/2020.  Will have patient take doxycycline and use DuoNebs every 6 hours as needed for shortness of breath and wheezing to hopefully avoid having to use systemic steroids as patient has healing left foot wound. If symptoms do not improve could consider short course of steroids. We will also get baseline lab work and a chest x-ray.  Will refer to pulmonary rehab for respiratory care services based off of interstitial lung disease, and patient's sedentary history.  Addendum: 07/20/2018 Patient has a daily chronic productive cough for more than 6 months.  Patient has also tried and failed flutter valve use.  Bronchitis, chronic obstructive, with exacerbation Lab work today  Chest x-ray today  Duoneb today   Doxycycline >>> 1 100 mg tablet  every 12 hours for 7 days >>>take with food  >>>wear sunscreen  >>>start daily probiotic   Please proceed to our [facility] office and go to the basement to receive your lab work as well as chest x-ray    We will send you home on oxygen today 2L at rest and 4 L with exertion >>>maintain oxygen saturations greater than 88 percent  >>>if unable to maintain oxygen saturations please contact the office  >>>do not smoke with oxygen  >>>can use nasal saline gel or nasal saline rinses to moisturize nose if oxygen causes dryness    Continue Anoro Ellipta  >>> Take 1 puff daily in the morning right when you wake up >>>Rinse your mouth out after use >>>This is a daily maintenance inhaler, NOT a rescue inhaler >>>Contact our office if you are having difficulties affording or obtaining this medication >>>It is important for you to be able to take this daily and not miss any doses  Only use your albuterol as a rescue medication to be used if you can't catch your breath by resting or doing a relaxed purse lip breathing pattern.  - The less you use it, the better it will work when you need it. - Ok to use up to 2 puffs  every 4 hours if you must but call for immediate appointment if use goes up over your usual need - Don't leave home without it !!  (  think of it like the spare tire for your car)   Can use DuoNeb nebulized medications every 6 hours as needed for shortness of breath or wheezing  Keep follow up with Dr. Annamaria Boots in March/2020  Obstructive sleep apnea  We recommend that you continue using your CPAP daily >>>Keep up the hard work using your device >>> Goal should be wearing this for the entire night that you are sleeping, at least 4 to 6 hours  Remember:  . Do not drive or operate heavy machinery if tired or drowsy.  . Please notify the supply company and office if you are unable to use your device regularly due to missing supplies or machine being broken.  . Work on maintaining a  healthy weight and following your recommended nutrition plan  . Maintain proper daily exercise and movement  . Maintaining proper use of your device can also help improve management of other chronic illnesses such as: Blood pressure, blood sugars, and weight management.   BiPAP/ CPAP Cleaning:  >>>Clean weekly, with Dawn soap, and bottle brush.  Set up to air dry.  Keep follow up with Dr. Annamaria Boots in March/2020  Shortness of breath Lab work today  Chest x-ray today  Duoneb today   Please proceed to our [facility] office and go to the basement to receive your lab work as well as chest x-ray   We will send you home on oxygen today 2L at rest and 4 L with exertion >>>maintain oxygen saturations greater than 88 percent  >>>if unable to maintain oxygen saturations please contact the office  >>>do not smoke with oxygen  >>>can use nasal saline gel or nasal saline rinses to moisturize nose if oxygen causes dryness  Keep follow up with Dr. Annamaria Boots in March/2020  Interstitial lung disease [facility])  We will send you home on oxygen today 2L at rest and 4 L with exertion >>>maintain oxygen saturations greater than 88 percent  >>>if unable to maintain oxygen saturations please contact the office  >>>do not smoke with oxygen  >>>can use nasal saline gel or nasal saline rinses to moisturize nose if oxygen causes dryness  We will refer to pulmonary rehab  Keep follow up with [name] in JAN/2020   This appointment was 43 minutes along with over 50% of the time in direct face-to-face patient care, assessment, plan of care follow-up and discussion   Lauraine Rinne, NP 07/02/2018

## 2018-07-02 NOTE — Telephone Encounter (Signed)
Called and spoke with patient she has been scheduled to see [name] today at 2:00 nothing further needed.

## 2018-07-02 NOTE — Assessment & Plan Note (Signed)
Lab work today  Chest x-ray today  Duoneb today   Please proceed to our [facility] office and go to the basement to receive your lab work as well as chest x-ray   We will send you home on oxygen today 2L at rest and 4 L with exertion >>>maintain oxygen saturations greater than 88 percent  >>>if unable to maintain oxygen saturations please contact the office  >>>do not smoke with oxygen  >>>can use nasal saline gel or nasal saline rinses to moisturize nose if oxygen causes dryness  Keep follow up with Dr. Annamaria Boots in March/2020

## 2018-07-03 LAB — PRO B NATRIURETIC PEPTIDE: NT-Pro BNP: 125 pg/mL (ref 0–301)

## 2018-07-03 NOTE — Progress Notes (Signed)
Labs are stable. Continue fluid pill as prescribed. Chest xray with stable ild. Keep follow up with this office.   [name]

## 2018-07-05 DIAGNOSIS — L97522 Non-pressure chronic ulcer of other part of left foot with fat layer exposed: Secondary | ICD-10-CM | POA: Diagnosis not present

## 2018-07-09 ENCOUNTER — Telehealth (HOSPITAL_COMMUNITY): Payer: Self-pay

## 2018-07-09 NOTE — Telephone Encounter (Signed)
Attempted to call pt  For additional information about pt post op status for consideration for Pulmonary rehab program. Attempted to contact pt at phone number [phone]. Call sounded like someone attempted to answer the call and then made clicking hang up sound with no response. Unable to leave a message. Will attempt to call back at later time.

## 2018-07-19 ENCOUNTER — Encounter (HOSPITAL_BASED_OUTPATIENT_CLINIC_OR_DEPARTMENT_OTHER): Payer: Medicare Other | Attending: Internal Medicine

## 2018-07-19 DIAGNOSIS — G4730 Sleep apnea, unspecified: Secondary | ICD-10-CM | POA: Insufficient documentation

## 2018-07-19 DIAGNOSIS — I10 Essential (primary) hypertension: Secondary | ICD-10-CM | POA: Insufficient documentation

## 2018-07-19 DIAGNOSIS — L97522 Non-pressure chronic ulcer of other part of left foot with fat layer exposed: Secondary | ICD-10-CM | POA: Insufficient documentation

## 2018-07-20 ENCOUNTER — Telehealth (HOSPITAL_COMMUNITY): Payer: Self-pay

## 2018-07-20 ENCOUNTER — Ambulatory Visit: Payer: Medicare Other | Admitting: Urology

## 2018-07-20 ENCOUNTER — Encounter (HOSPITAL_COMMUNITY): Payer: Self-pay

## 2018-07-20 DIAGNOSIS — L97522 Non-pressure chronic ulcer of other part of left foot with fat layer exposed: Secondary | ICD-10-CM | POA: Diagnosis present

## 2018-07-20 DIAGNOSIS — I10 Essential (primary) hypertension: Secondary | ICD-10-CM | POA: Diagnosis not present

## 2018-07-20 DIAGNOSIS — G4730 Sleep apnea, unspecified: Secondary | ICD-10-CM | POA: Diagnosis not present

## 2018-07-20 NOTE — Telephone Encounter (Signed)
Attempted to contact patient for additional information about post op status in regards to consideration for pulmonary rehab. Attempted to contact at pt preferred number of [phone]. Phone rand and then sounded like call disconnected. Unable to leave message. This is second attempt to contact pt. Will send letter to pt at listed address requesting pt to contact.   Joycelyn Man RN, BSN Cardiac and Pulmonary Rehab RN

## 2018-07-25 ENCOUNTER — Telehealth (HOSPITAL_COMMUNITY): Payer: Self-pay | Admitting: *Deleted

## 2018-07-25 NOTE — Telephone Encounter (Signed)
07/25/18 attempted to reach pt to schedule appt

## 2018-07-26 ENCOUNTER — Other Ambulatory Visit: Payer: Self-pay | Admitting: Internal Medicine

## 2018-07-26 ENCOUNTER — Other Ambulatory Visit (HOSPITAL_COMMUNITY): Payer: Self-pay | Admitting: Internal Medicine

## 2018-07-26 DIAGNOSIS — E785 Hyperlipidemia, unspecified: Secondary | ICD-10-CM

## 2018-07-26 DIAGNOSIS — L97522 Non-pressure chronic ulcer of other part of left foot with fat layer exposed: Secondary | ICD-10-CM

## 2018-07-26 DIAGNOSIS — I2510 Atherosclerotic heart disease of native coronary artery without angina pectoris: Secondary | ICD-10-CM

## 2018-07-27 NOTE — Telephone Encounter (Signed)
lmov to schedule appt

## 2018-07-27 NOTE — Telephone Encounter (Signed)
Please review for refill. Last OV 06/2017.

## 2018-07-27 NOTE — Telephone Encounter (Signed)
-----  Message from Alba Destine, [place] sent at 07/27/2018  7:30 AM EST ----- Please schedule patient an appointment, no refill of Potassium sent in at this time.Marland Kitchen

## 2018-07-27 NOTE — Telephone Encounter (Addendum)
The patient was last seen by Dr. Saunders Revel 06/30/17. She was supposed to follow up in 6 months. She has recently seen her PCP who checked her labs.  Refill request is for potassium.   Will review with Dr. Saunders Revel if ok to refill potassium prior to an office visit.

## 2018-07-30 ENCOUNTER — Other Ambulatory Visit: Payer: Self-pay | Admitting: Internal Medicine

## 2018-07-30 ENCOUNTER — Ambulatory Visit (HOSPITAL_COMMUNITY)
Admission: RE | Admit: 2018-07-30 | Discharge: 2018-07-30 | Disposition: A | Discharge status: Home / Self Care | Payer: Medicare Other | Source: Ambulatory Visit | Attending: Family Medicine | Admitting: Family Medicine

## 2018-07-30 DIAGNOSIS — E785 Hyperlipidemia, unspecified: Secondary | ICD-10-CM

## 2018-07-30 DIAGNOSIS — L97522 Non-pressure chronic ulcer of other part of left foot with fat layer exposed: Secondary | ICD-10-CM | POA: Insufficient documentation

## 2018-07-30 DIAGNOSIS — I2510 Atherosclerotic heart disease of native coronary artery without angina pectoris: Secondary | ICD-10-CM

## 2018-07-30 MED ORDER — ROSUVASTATIN CALCIUM 20 MG PO TABS: 20.0000 mg | ORAL_TABLET | Freq: Every day | ORAL | 0 refills | Status: DC

## 2018-07-30 NOTE — Telephone Encounter (Signed)
Routed Dr. Darnelle Bos recommendations to the refill pool via RX refill request.

## 2018-07-30 NOTE — Telephone Encounter (Signed)
Ok to refill x 1 month.  She will need follow-up visit with me or another provider for further refills.  Alternatively, she can discuss further refills/medication adjustments with her PCP.  Nelva Bush, MD [facility] Pager: [phone]

## 2018-07-30 NOTE — Telephone Encounter (Signed)
*  STAT* If patient is at the pharmacy, call can be transferred to refill team.   1. Which medications need to be refilled? (please list name of each medication and dose if known) Rosuvastatin 20 mg po q d   2. Which pharmacy/location (including street and city if local pharmacy) is medication to be sent to? [facility]   3. Do they need a 30 day or 90 day supply? [place]

## 2018-07-31 ENCOUNTER — Ambulatory Visit (INDEPENDENT_AMBULATORY_CARE_PROVIDER_SITE_OTHER): Payer: Medicare Other | Admitting: Internal Medicine

## 2018-07-31 ENCOUNTER — Other Ambulatory Visit: Payer: Self-pay | Admitting: *Deleted

## 2018-07-31 ENCOUNTER — Encounter: Payer: Self-pay | Admitting: Internal Medicine

## 2018-07-31 VITALS — BP 118/64 | HR 87 | Ht 61.0 in | Wt 205.0 lb

## 2018-07-31 DIAGNOSIS — R062 Wheezing: Secondary | ICD-10-CM | POA: Diagnosis not present

## 2018-07-31 DIAGNOSIS — R0609 Other forms of dyspnea: Secondary | ICD-10-CM | POA: Diagnosis not present

## 2018-07-31 DIAGNOSIS — I2510 Atherosclerotic heart disease of native coronary artery without angina pectoris: Secondary | ICD-10-CM

## 2018-07-31 DIAGNOSIS — R0600 Dyspnea, unspecified: Secondary | ICD-10-CM

## 2018-07-31 DIAGNOSIS — J9621 Acute and chronic respiratory failure with hypoxia: Secondary | ICD-10-CM | POA: Diagnosis not present

## 2018-07-31 DIAGNOSIS — J849 Interstitial pulmonary disease, unspecified: Secondary | ICD-10-CM | POA: Diagnosis not present

## 2018-07-31 DIAGNOSIS — E785 Hyperlipidemia, unspecified: Secondary | ICD-10-CM

## 2018-07-31 LAB — NITRIC OXIDE: Nitric Oxide: 23

## 2018-07-31 MED ORDER — POTASSIUM CHLORIDE CRYS ER 20 MEQ PO TBCR: 20.0000 meq | EXTENDED_RELEASE_TABLET | Freq: Three times a day (TID) | ORAL | 0 refills | Status: DC

## 2018-07-31 NOTE — Progress Notes (Signed)
05/21/2018- 73 year old female never smoker followed for allergic rhinitis, rhinosinusitis, asthmatic bronchitis, ILD, OSA,complicated by GERD/Crohn's, Rheumatoid arthritis/prednisone -----4 month follow up for asthma and OSA. Per patient she is still having issues with SOB. She has had 2 foot surgeries since her last visit. States that the [name] is working but [name] wanted to switch her to something else. Unsure of the medication name and I did not see it in his last OV.  Singulair, and Anoro, neb duoneb CPAP auto 5-15/Advanced   Download 100% compliance AHI 1.3/hour. I discussed FENO and she will try Symbicort. Discussed inhaled steroids.  We need to get her a new AeroChamber. Has been on sustained antibiotics for failed appliance in her foot. Notices some runny nose and scant bland mucus.  Wheezing has been well controlled.  Little cough.  She and her husband recognize inability to get any exercise as a substantial reason for dyspnea.  No acute events.  Cardiology continues to follow, including her pulmonary hypertension.  We reviewed her most recent chest CT and discussed implications of progressive interstitial fibrosis.  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  I would like to refer her to [name] for his opinion and advice. FENO 03/15/18  49 H CT chest Hi Res 02/02/2018- IMPRESSION: 1. Spectrum of findings compatible with fibrotic interstitial lung disease with a slight basilar predominance, with mild progression since 2017. 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 2. Mild patchy air trapping, unchanged, indicative of small airways disease. 3. Left main and two-vessel coronary atherosclerosis. 4. Stable dilated main pulmonary artery, suggesting chronic pulmonary arterial hypertension. Aortic Atherosclerosis (ICD10-I70.0).  06/08/18- 73 year old  female never smoker followed for allergic rhinitis, rhinosinusitis, asthmatic bronchitis, ILD, OSA,complicated by GERD/Crohn's, Rheumatoid arthritis/prednisone -----coughing for 2 weeks, yellow, green mucus, SOB  Singulair, Atrovent nasal spray, Symbicort 160, and Anoro, neb albuterol, neb DuoNeb, Complains of increased cough with chest congestion, green/yellow sputum, low-grade fever 99 degrees over the last 2 weeks.  Not on prednisone now.  Last antibiotic in October. Pending ILD consultation with [name] in December.   07/02/2018  - Visit   73 year old female patient presenting today for acute visit.  Patient was recently treated with a course of Augmentin and reports sputum color is now light green but has decreased in sputum amount since last office visit.  Patient does not feel she is getting better.  Patient remains adherent to Anoro Ellipta.  On arrival to our office today patient was short of breath and oxygen saturations were 81%.  With deep breathing patient was able to increase oxygenation to 85%.  When placed on 2 L O2 patient was able to maintain at 91%.  Patient had previously been on oxygen in 2015 but this was later [facility].  Patient's weight has also increased over the previous office visits, patient remains adherent to diuretic treatment plan as on taking 1 diuretic daily.  Patient reports she is allowed to take an additional diuretic when she feels necessary.  Unfortunately patient has not been weighing herself regularly there is no set plan to when she should take her second diuretic.  Patient is exceptionally sedentary at home and rarely gets out of her bed or lazy boy chair which is about 3 feet from her bed.  Patient's husband takes care of patient completely including getting drinks and food so patient does not have to leave her lazy boy recliner.  Patient watches many television shows and does not report much activity.   Patient has completed follow-up with rheumatology last  week and no changes to her medications were made.  Patient reports that she was stable at the time.  CPAP compliance report showing 30 out of 30 days used.  All 30 those days greater than 4 hours.  APAP settings 5-15.  Average usage days 7 hours and 56 minutes.  AHI 0.8.    OV 07/31/2018 -referred to interstitial lung disease clinic  Subjective:  [name], female , DOB: 04/20/1946 , age 18 y.o. , MRN: [id number] , ADDRESS: [address] [place] [address]   07/31/2018 -   Chief Complaint  Patient presents with  . Follow-up    [name] that stated to be referred to ILD clinic.  Pt currently has complaints of wheezing which she has had since December 2019, worsening SOB when moving around and also states she has a pain in left side of chest near shoulder. Pt wears between 2-4L O2 but mainly has been wearing 4L at home.      [name] 73 y.o. -accompanied by her husband.  Referred by [name] pulmonologist in our practice for evaluation of interstitial lung disease in the setting of rheumatoid arthritis.  History is gained from her, talking to her husband, review of recent office visits and also the interstitial lung disease questionnaire.  As best as I can gather   [facility] ILD Questionnaire  Symptoms: She is known to have ILD on a CT chest in 2017 but she is not aware of it.  She says that she follows normally for sleep apnea and asthma with [name].  She recollects that approximately 11 or 12 years ago she was hospitalized for pneumonia and was on oxygen for short while not otherwise specified.  Then came off oxygen.  After that overall she is been stable and sedentary using her CPAP.  She is mostly sedentary because of obesity, rheumatoid arthritis and also because of the nonhealing ulcer in her feet for which she is been advised sedentary lifestyle to enable healing.  Most recently to Thanksgiving 2019  she was seen for respiratory exacerbation and given antibiotics and prednisone.  She followed up mid December 2019 and was found to have new onset hypoxemia 85% on room air.  She was then discharged on portable oxygen which she is using 4 L nasal cannula at rest.  She tells me that at home when she comes off of CPAP she is in her 98s on room air at rest.  In fact in the office today she is 83% on room air at rest but 93% on 2 L nasal cannula at rest.  There is concerned that she has worsening ILD particularly summer 2019 on her CT chest had progressive findings compared to 2 years earlier.  The presumption is that this ILD is caused by rheumatoid arthritis.  In the 2019 CT chest CT is reported as probable UIP but in my personal visualization is either indeterminate or alternative diagnosis.  In terms of symptoms: She reports insidious onset of shortness of breath for some years and gradually getting worse.  Level 2 dyspnea at rest.  Level for dyspnea taking a shower.  Level 5 dyspnea walking at her own pace of walking with others of her age and walking up stairs or walking up a hill.  She does have a cough  with yellow and green sputum.  She does cough at night.  There is also wheezing  Past Medical History : She has longstanding history of rheumatoid arthritis since the 1980s.  Used to be followed by Dr. Danie Binder.  As best as I can gather she used to be on methotrexate maybe 10 or 20 years ago.  She took it for a short while and then stopped it because of diarrhea.  She took this for less than 1 year.  She believes that after that she was basically on pain management.  She is only been on prednisone chronic intake for a total of 1 year approximately 4 to 5 years ago.  Around this time Dr. Hoyt Koch retired and she started seeing Dr. Gavin Pound.  Since then she is been on Arava and Plaquenil.  She has never seen any other immunomodulators in the setting of rheumatoid arthritis according to  history.  Overall the rheumatoid arthritis has caused some disability with deformed joints in her hands and also her using cane.    She is not on any anticoagulation is never had any blood clots.  In 2017 she had vascular extremity duplex venous that was negative for DVT.  Most recently she is been dealing with nonhealing ulcer of the left toe/metatarsal .  She had August and September 2018.  And then 1 in the latter part of 2019.  Most recently she says she has had a duplex lower extremity venous.  I do not have access to these results.  She is waiting on this.  Was done in the last few to several days.   She also has a longstanding history of asthma for which she is on Singulair.  Exam nitric oxide was elevated in August 2019 at 49 ppb but 07/31/2018 I snormal in the 20s though she is wheezing   She has nonobstructive coronary artery disease and in March 2018 had a right heart catheterization with elevated pulmonary pressures but also slightly elevated wedge pressure [see below].  She is started on Lasix  She has sleep apnea for which she uses CPAP.  She has obesity  ROS: Positive for arthralgia and arthritis.  Dry eyes and dry mouth but otherwise negative   FAMILY HISTORY of LUNG DISEASE: Denies   EXPOSURE HISTORY: Never smoked any cigarettes, marijuana, vaping, cocaine or intravenous drug use   HOME and HOBBY DETAILS : [place] lived [place] for 8 years.  No mold.  No dampness.  Does use humidifier and does use CPAP but there is no mold in it.  Also uses nebulizer machine but there is no mold.  No pet birds no musical instruments no guarding habits   OCCUPATIONAL HISTORY (122 questions) : Denies   PULMONARY TOXICITY HISTORY (27 items): She has taken nitrofurantoin 9 years ago.  Has not taken methotrexate for a year 5 years ago and is taken sulfasalazine       Results for IVAN, MASKELL (MRN [id number]) as of 07/31/2018 16:31  Ref. Range 09/03/2015 12:45  02/21/2017  FVC-Pre Latest Units: L 2.18 1.7L  FVC-%Pred-Pre Latest Units: % 84 66%   Results for RMANI, KAPUSTA (MRN [id number]) as of 07/31/2018 16:31  Ref. Range 09/03/2015 12:45  DLCO cor Latest Units: ml/min/mmHg 14.06  DLCO cor % pred Latest Units: % 69   HEART CATH MARCH 2018 Right Heart Pressures RA (mean): 13 mmHg RV (S/EDP): 38/12 mmHg PA (S/D, mean): 43/23 (32) mmHg PCWP (mean): 16 mmHg  Conclusions: 1. Mild to moderate, non-obstructive coronary artery disease with 50% mid LAD and 20% proximal RCA stenoses. 2. Mild to moderate pulmonary hypertension with mildly elevated right heart filling pressures. 3. Upper normal left heart filling pressures. 4. Normal Fick cardiac output/index. 5. Equalization of end-diastolic pressures with ventricular concordance. This can be seen in the setting of restrictive process.  Recommendations: 1. Escalate statin therapy to prevent progression of CAD. 2. Increase furosemide to 40 mg BID and KCl to 40 mg BID. Patient to have BMP in 1 week to evaluate renal function and potassium. 3. Continue outpatient pulmonary follow-up. I suspect underlying lung disease is the driving force behind the patient's dyspnea, pulmonary hypertension, and elevated right heart pressures. 4. Outpatient follow-up in cardiology clinic in ~6 weeks.  Nelva Bush, MD   CT chest high res July 2019 - visualized personally - personaly opinion - indeterminate or alt dx for UIP but agree with progression  IMPRESSION: Lungs/Pleura: No pneumothorax. No pleural effusion. No acute consolidative airspace disease or lung masses. Few scattered small solid pulmonary nodules in mid to upper right lung, largest 4 mm in the right upper lobe (series 3/image 46), unchanged since 03/14/2016 CT, considered benign. No new significant pulmonary nodules. Mild patchy air trapping in both lungs on the expiration sequence, unchanged. Patchy peribronchovascular and subpleural  reticulation and ground-glass attenuation throughout both lungs with associated mild traction bronchiectasis and mild architectural distortion. There is a slight basilar gradient to these findings. Tiny focus of honeycombing in anterior right upper lobe (series 8/image 59). These findings have mildly worsened since 03/14/2016 chest CT. 1. Spectrum of findings compatible with fibrotic interstitial lung disease with a slight basilar predominance, with mild progression since 2017. 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 2. Mild patchy air trapping, unchanged, indicative of small airways disease. 3. Left main and two-vessel coronary atherosclerosis. 4. Stable dilated main pulmonary artery, suggesting chronic pulmonary arterial hypertension.  Aortic Atherosclerosis (ICD10-I70.0).   Electronically Signed   By: Ilona Sorrel M.D.   On: 02/02/2018 14:03  Results for ZUMA, HUST (MRN [id number]) as of 07/31/2018 16:31  Ref. Range 03/15/2018 16:07 07/31/2018   FeNO level (ppb) Unknown 49 20's  Results for ELIZABELLE, FITE (MRN [id number]) as of 07/31/2018 16:31  Ref. Range 09/12/2016 11:30 09/23/2016 12:52  EOS (ABSOLUTE) Latest Ref Range: 0.0 - 0.4 x10E3/uL 0.0 0.0   ROS - per Hailey     has a past medical history of Acute asthmatic bronchitis, Allergic rhinitis, Esophageal reflux, Irritable bowel syndrome, and Rheumatoid arthritis(714.0).   reports that she has never smoked. She has never used smokeless tobacco.  Past Surgical History:  Procedure Laterality Date  . ABDOMINAL HYSTERECTOMY    . ANKLE FUSION  2009   left  . BACK SURGERY    . CHOLECYSTECTOMY    . RIGHT/LEFT HEART CATH AND CORONARY ANGIOGRAPHY N/A 09/16/2016   Procedure: Right/Left Heart Cath and Coronary Angiography;  Surgeon: Nelva Bush, MD;  Location: [facility];  Service: Cardiovascular;  Laterality: N/A;   . TOTAL KNEE ARTHROPLASTY Bilateral     Allergies  Allergen Reactions  . Cefdinir Diarrhea  . Erythromycin Base Diarrhea    Other  . Lactose Intolerance (Gi) Diarrhea  . Lactulose Diarrhea  . Mesalamine Nausea Only  . Nitrofuran Derivatives     shakiness  . Nitrofurantoin Other (See Comments)    shakiness other  . Other Diarrhea and  Other (See Comments)    Shaking uncontrollably "lettuce only" "lettuce only"  . Sulfa Antibiotics Other (See Comments)    Patient can't recall reaction   . Tdap [Tetanus-Diphth-Acell Pertussis]     Shaking uncontrollably  . Tetanus Toxoid, Adsorbed Other (See Comments)    Shaking uncontrollable     Immunization History  Administered Date(s) Administered  . Influenza Split 03/18/2012, 03/18/2013, 04/14/2014, 04/17/2017  . Influenza Whole 04/30/2008, 04/20/2009, 04/18/2011  . Influenza, High Dose Seasonal PF 04/23/2015, 03/24/2016, 04/06/2018  . Pneumococcal Conjugate-13 11/11/2013  . Pneumococcal Polysaccharide-23 05/21/2018  . Zoster Recombinat (Shingrix) 05/05/2017    Family History  Problem Relation Age of Onset  . Heart disease Mother   . Arthritis Mother   . Heart attack Father   . Diabetes Other        sibling  . Heart attack Other        sibling     Current Outpatient Medications:  .  albuterol (PROVENTIL HFA;VENTOLIN HFA) 108 (90 Base) MCG/ACT inhaler, Inhale 2 puffs into the lungs every 6 (six) hours as needed., Disp: 1 Inhaler, Rfl: 2 .  ALPRAZolam (XANAX) 0.5 MG tablet, Take 0.5 mg by mouth as needed., Disp: , Rfl:  .  ANORO ELLIPTA 62.5-25 MCG/INH AEPB, USE 1 INHALATION DAILY, Disp: 180 each, Rfl: 2 .  aspirin EC 81 MG tablet, Take 81 mg by mouth daily., Disp: , Rfl:  .  benzonatate (TESSALON) 200 MG capsule, TAKE 1 CAPSULE BY MOUTH 3 TIMES A DAY AS NEEDED FOR COUGH, Disp: 30 capsule, Rfl: 3 .  cetirizine (ZYRTEC) 10 MG tablet, Take 10 mg by mouth daily., Disp: , Rfl:  .  clidinium-chlordiazePOXIDE (LIBRAX) 5-2.5 MG  capsule, Take 1 capsule by mouth 2 (two) times daily as needed (for IBS)., Disp: , Rfl:  .  colestipol (COLESTID) 1 G tablet, Take 1 g by mouth 2 (two) times daily. , Disp: , Rfl:  .  [employer] D3 5000 units capsule, Take 5,000 Units by mouth daily., Disp: , Rfl: 2 .  FLUoxetine (PROZAC) 20 MG capsule, Take 20 mg by mouth daily., Disp: , Rfl:  .  fluticasone (FLONASE) 50 MCG/ACT nasal spray, Place 2 sprays into the nose daily. , Disp: , Rfl:  .  furosemide (LASIX) 40 MG tablet, TAKE 1 TABLET BY MOUTH TWO  TIMES DAILY, Disp: 180 tablet, Rfl: 1 .  glycopyrrolate (ROBINUL) 1 MG tablet, Take 1 mg by mouth 3 (three) times daily as needed (for IBS). , Disp: , Rfl:  .  hydroxychloroquine (PLAQUENIL) 200 MG tablet, Take 600 mg by mouth daily. , Disp: , Rfl:  .  hydroxypropyl methylcellulose / hypromellose (ISOPTO TEARS / GONIOVISC) 2.5 % ophthalmic solution, Place 1-2 drops into both eyes 3 (three) times daily as needed for dry eyes., Disp: , Rfl:  .  ibuprofen (ADVIL,MOTRIN) 400 MG tablet, Take 400 mg by mouth as needed., Disp: , Rfl:  .  ipratropium (ATROVENT) 0.03 % nasal spray, USE 1 TO 2 SPRAYS INTO BOTH NOSTRILS 2 TIMES DAILY., Disp: 90 mL, Rfl: 1 .  ipratropium-albuterol (DUONEB) 0.5-2.5 (3) MG/3ML SOLN, Take 3 mLs by nebulization every 6 (six) hours as needed., Disp: 75 mL, Rfl: 12 .  leflunomide (ARAVA) 20 MG tablet, Take 20 mg by mouth daily. , Disp: , Rfl:  .  mirtazapine (REMERON) 45 MG tablet, Take 45 mg by mouth at bedtime., Disp: , Rfl:  .  montelukast (SINGULAIR) 10 MG tablet, TAKE 1 TABLET BY MOUTH AT  BEDTIME, Disp: 90  tablet, Rfl: 1 .  Multiple Vitamin (MULTIVITAMIN) tablet, Take 1 tablet by mouth daily., Disp: , Rfl:  .  omeprazole (PRILOSEC) 40 MG capsule, Take 40 mg by mouth daily., Disp: , Rfl:  .  potassium chloride SA (K-DUR,KLOR-CON) 20 MEQ tablet, Take 1 tablet (20 mEq total) by mouth 3 (three) times daily., Disp: 270 tablet, Rfl: 0 .  rosuvastatin (CRESTOR) 20 MG tablet, Take 1 tablet  (20 mg total) by mouth daily., Disp: 90 tablet, Rfl: 0 .  Spacer/Aero-Holding Chambers (AEROCHAMBER MV) inhaler, Use as instructed, Disp: 1 each, Rfl: 0 .  traMADol (ULTRAM) 50 MG tablet, Take 50 mg by mouth 2 (two) times daily. , Disp: , Rfl:  .  verapamil (CALAN-SR) 240 MG CR tablet, Take 240 mg by mouth daily., Disp: , Rfl:  .  promethazine-codeine (PHENERGAN WITH CODEINE) 6.25-10 MG/5ML syrup, Take 5 mLs by mouth every 12 hours as needed for up to 10 days for cough. (Patient not taking: Reported on 07/31/2018), Disp: 240 mL, Rfl: 0      Objective:   Vitals:   07/31/18 1628 07/31/18 1709  BP: 118/64   Pulse: 87   SpO2: 93% (!) 83%  Weight: 205 lb (93 kg)   Height: _0  (1.549 m)     Estimated body mass index is 38.73 kg/m as calculated from the following:   Height as of this encounter: _1  (1.549 m).   Weight as of this encounter: 205 lb (93 kg).  _2 @  [employer]   07/31/18 1628  Weight: 205 lb (93 kg)     Physical Exam  General Appearance:    Alert, cooperative, no distress, appears stated age - no, looks older , Deconditioned looking - no , OBESE  - yes, Sitting on Wheelchair -  no  Head:    Normocephalic, without obvious abnormality, atraumatic  Eyes:    PERRL, conjunctiva/corneas clear,  Ears:    Normal TM's and external ear canals, both ears  Nose:   Nares normal, septum midline, mucosa normal, no drainage    or sinus tenderness. OXYGEN ON  - YES . Patient is @ 2L  - 93% , 83% - RA  Throat:   Lips, mucosa, and tongue normal; teeth and gums normal. Cyanosis on lips - no  Neck:   Supple, symmetrical, trachea midline, no adenopathy;    thyroid:  no enlargement/tenderness/nodules; no carotid   bruit or JVD  Back:     Symmetric, no curvature, ROM normal, no CVA tenderness  Lungs:     Distress - no , Wheeze yes, scattered, Barrell Chest - no, Purse lip breathing - no, Crackles - yes at base   Chest Wall:    No tenderness or deformity.    Heart:     Regular rate and rhythm, S1 and S2 normal, no rub   or gallop, Murmur - no  Breast Exam:    NOT DONE  Abdomen:     Soft, non-tender, bowel sounds active all four quadrants,    no masses, no organomegaly. Visceral obesity - yes  Genitalia:   NOT DONE  Rectal:   NOT DONE  Extremities:   Extremities - normal, Has Cane - yes, Clubbing - no, Edema - no. FOOT IN BOOT  Pulses:   2+ and symmetric all extremities  Skin:   Stigmata of Connective Tissue Disease - YES, OF RA +  Lymph nodes:   Cervical, supraclavicular, and axillary nodes normal  Psychiatric:  Neurologic:   Pleasant -  ytes, Anxious - no, Flat affect - no  CAm-ICU - neg, Alert and Oriented x 3 - yes, Moves all 4s - yes, Speech - normal, Cognition - intact           Assessment:       ICD-10-CM   1. Acute on chronic respiratory failure with hypoxia (HCC) J96.21 D-Dimer, Quantitative    Basic Metabolic Panel (BMET)    CBC w/Diff    Troponin I    Troponin I    CBC w/Diff    Basic Metabolic Panel (BMET)    D-Dimer, Quantitative  2. Interstitial pulmonary disease (HCC) J84.9 Pulmonary function test    CANCELED: CT Chest High Resolution  3. Wheezing R06.2 Nitric oxide  4. Dyspnea on exertion R06.09 B Nat Peptide    B Nat Peptide       Plan:     Patient Instructions     ICD-10-CM   1. Acute on chronic respiratory failure with hypoxia (HCC) J96.21   2. Interstitial pulmonary disease (HCC) J84.9 Pulmonary function test    CT Chest High Resolution  3. Wheezing R06.2 Nitric oxide  4. Dyspnea on exertion R06.09    Need to rule out worsening pulmonary fibrosis v diastolic heart failure v blood clot in lung In terms of etiology of pulmonary fibrosis rheumatoid arthritis is the most likely reason.  There is some risk factors for potential hypersensitive pneumonitis such as using nebulizer or CPAP or humidifier but none of this has mold in it.  Her nitrofurantoin and methotrexate use were brief and many years ago.   Plan -  d-dimer, bnp, bmet, cbc, troponin  07/31/2018  - Based on D-dimer results  - if normal, just do HRCT next few days  - if abnormal will HRCT and CT angio chest   - we will call 08/01/18 to decide this - spirometry/dlco in few days - sign release to get your duplel LE results - continue o2 and other meds  Followup - 1 week with Dr [name] - preferred or with an APP but on same day Dr [name] is in clinic as well   > 50% of this > 40 min visit spent in face to face counseling or/and coordination of care - by this undersigned MD - Dr Brand Males. This includes one or more of the following documented above: discussion of test results, diagnostic or treatment recommendations, prognosis, risks and benefits of management options, instructions, education, compliance or risk-factor reduction   SIGNATURE    Dr. Brand Males, M.D., F.C.C.P,  Pulmonary and Critical Care Medicine Staff Physician, [place] Director - Interstitial Lung Disease  Program  Pulmonary [place] [facility] [place], [place], [address]  Pager: [phone], If no answer or between  15:00h - 7:00h: call [phone] Telephone: [phone]  5:52 PM 07/31/2018

## 2018-07-31 NOTE — Patient Instructions (Addendum)
ICD-10-CM   1. Acute on chronic respiratory failure with hypoxia (HCC) J96.21   2. Interstitial pulmonary disease (HCC) J84.9 Pulmonary function test    CT Chest High Resolution  3. Wheezing R06.2 Nitric oxide  4. Dyspnea on exertion R06.09    Need to rule out worsening pulmonary fibrosis v diastolic heart failure v blood clot in lung In terms of etiology of pulmonary fibrosis rheumatoid arthritis is the most likely reason.  There is some risk factors for potential hypersensitive pneumonitis such as using nebulizer or CPAP or humidifier but none of this has mold in it.  Her nitrofurantoin and methotrexate use were brief and many years ago.   Plan - d-dimer, bnp, bmet, cbc, troponin  07/31/2018  - Based on D-dimer results  - if normal, just do HRCT next few days  - if abnormal will HRCT and CT angio chest   - we will call 08/01/18 to decide this - spirometry/dlco in few days - sign release to get your duplel LE results - continue o2 and other meds  Followup - 1 week with Dr [name] - preferred or with an APP but on same day Dr [name] is in clinic as well

## 2018-08-01 ENCOUNTER — Telehealth: Payer: Self-pay | Admitting: Internal Medicine

## 2018-08-01 DIAGNOSIS — J849 Interstitial pulmonary disease, unspecified: Secondary | ICD-10-CM

## 2018-08-01 LAB — CBC WITH DIFFERENTIAL/PLATELET
Basophils Absolute: 0.1 10*3/uL (ref 0.0–0.1)
Basophils Relative: 0.6 % (ref 0.0–3.0)
Eosinophils Absolute: 0.6 10*3/uL (ref 0.0–0.7)
Eosinophils Relative: 6.4 % — ABNORMAL HIGH (ref 0.0–5.0)
HCT: 40 % (ref 36.0–46.0)
Hemoglobin: 13 g/dL (ref 12.0–15.0)
Lymphocytes Relative: 14.8 % (ref 12.0–46.0)
Lymphs Abs: 1.3 10*3/uL (ref 0.7–4.0)
MCHC: 32.4 g/dL (ref 30.0–36.0)
MCV: 93.2 fl (ref 78.0–100.0)
Monocytes Absolute: 0.9 10*3/uL (ref 0.1–1.0)
Monocytes Relative: 9.7 % (ref 3.0–12.0)
Neutro Abs: 6.2 10*3/uL (ref 1.4–7.7)
Neutrophils Relative %: 68.5 % (ref 43.0–77.0)
Platelets: 272 10*3/uL (ref 150.0–400.0)
RBC: 4.3 Mil/uL (ref 3.87–5.11)
RDW: 13.9 % (ref 11.5–15.5)
WBC: 9 10*3/uL (ref 4.0–10.5)

## 2018-08-01 LAB — BASIC METABOLIC PANEL
BUN: 17 mg/dL (ref 6–23)
CO2: 29 mEq/L (ref 19–32)
Calcium: 10.3 mg/dL (ref 8.4–10.5)
Chloride: 101 mEq/L (ref 96–112)
Creatinine, Ser: 1.02 mg/dL (ref 0.40–1.20)
GFR: 56.26 mL/min — ABNORMAL LOW (ref >60.00)
Glucose, Bld: 101 mg/dL — ABNORMAL HIGH (ref 70–99)
Potassium: 4.4 mEq/L (ref 3.5–5.1)
Sodium: 138 mEq/L (ref 135–145)

## 2018-08-01 LAB — BRAIN NATRIURETIC PEPTIDE: Pro B Natriuretic peptide (BNP): 38 pg/mL (ref 0.0–100.0)

## 2018-08-01 LAB — TROPONIN I: TNIDX: 0.01 ug/l (ref 0.00–0.06)

## 2018-08-01 LAB — D-DIMER, QUANTITATIVE: D-Dimer, Quant: 0.49 mcg/mL FEU (ref <0.50)

## 2018-08-01 NOTE — Telephone Encounter (Signed)
D-dimer normal  Plan Do CT chest high resolution for ILD

## 2018-08-01 NOTE — Telephone Encounter (Signed)
Scheduled

## 2018-08-02 NOTE — Telephone Encounter (Signed)
Spoke with pt, aware of results/recs.  CT chest ordered as requested.  Nothing further needed at this time.

## 2018-08-03 DIAGNOSIS — L97522 Non-pressure chronic ulcer of other part of left foot with fat layer exposed: Secondary | ICD-10-CM | POA: Diagnosis not present

## 2018-08-10 ENCOUNTER — Ambulatory Visit: Payer: Medicare Other | Admitting: Pulmonary Disease

## 2018-08-16 ENCOUNTER — Ambulatory Visit (INDEPENDENT_AMBULATORY_CARE_PROVIDER_SITE_OTHER)
Admission: RE | Admit: 2018-08-16 | Discharge: 2018-08-16 | Disposition: A | Discharge status: Home / Self Care | Payer: Medicare Other | Source: Ambulatory Visit | Attending: Internal Medicine | Admitting: Internal Medicine

## 2018-08-16 DIAGNOSIS — J849 Interstitial pulmonary disease, unspecified: Secondary | ICD-10-CM | POA: Diagnosis not present

## 2018-08-20 ENCOUNTER — Telehealth: Payer: Self-pay | Admitting: Pulmonary Disease

## 2018-08-20 ENCOUNTER — Telehealth (HOSPITAL_COMMUNITY): Payer: Self-pay

## 2018-08-20 NOTE — Telephone Encounter (Signed)
Pt called for f/u from letter she received about a referral to pulmonary rehab. Spoke with pt about foot surgery in October and subsequent wound care follow ups. Pt states she has been cleared by surgery for activity and has been released by wound care, but that doctor states it is "new skin" and needs to be treated careful. Informed pt that pulmonary RN would follow up with surgeon and wound care for clarifications on any restrictions to exercise for the pt. Informed pt that call back would be made to her once clearance was received or with any information. Pt verbalized understanding.   Joycelyn Man RN, BSN Cardiac and Pulmonary Rehab RN

## 2018-08-20 NOTE — Telephone Encounter (Signed)
Called  [phone],  and spoke with Patient. She stated that she received letter from pulmonary rehab, and she has called, but no one will call her back.   Called [phone], pulmonary referral, spoke with [name], she said to have Patient call [phone], ask for Clarita.  Called and gave Patient name and number to call.  Nothing further at this time.

## 2018-08-22 ENCOUNTER — Other Ambulatory Visit: Payer: Self-pay | Admitting: Internal Medicine

## 2018-08-22 ENCOUNTER — Other Ambulatory Visit: Payer: Self-pay | Admitting: Pulmonary Disease

## 2018-08-22 DIAGNOSIS — I2510 Atherosclerotic heart disease of native coronary artery without angina pectoris: Secondary | ICD-10-CM

## 2018-08-22 DIAGNOSIS — E785 Hyperlipidemia, unspecified: Secondary | ICD-10-CM

## 2018-08-22 NOTE — Telephone Encounter (Signed)
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 Saunders Revel.

## 2018-08-22 NOTE — Telephone Encounter (Signed)
Please review for refill. Thanks!  

## 2018-08-23 ENCOUNTER — Telehealth (HOSPITAL_COMMUNITY): Payer: Self-pay

## 2018-08-23 ENCOUNTER — Other Ambulatory Visit: Payer: Self-pay | Admitting: Internal Medicine

## 2018-08-23 DIAGNOSIS — E785 Hyperlipidemia, unspecified: Secondary | ICD-10-CM

## 2018-08-23 DIAGNOSIS — I2510 Atherosclerotic heart disease of native coronary artery without angina pectoris: Secondary | ICD-10-CM

## 2018-08-23 NOTE — Telephone Encounter (Signed)
Written release received from Wound Care. Note from Dr. Donnie Mesa from 07/25/18 stated that pt was release from care, weight bearing as tolerated and follow up as needed. Clinical review of chart and pt appropriate for scheduling. Pt called and informed of clearance and that info would be forwarded to support staff for verification of insurance and she would be contacted for scheduling.  Joycelyn Man RN, BSN Cardiac and Pulmonary Rehab RN

## 2018-08-23 NOTE — Telephone Encounter (Signed)
Shatara RN from wound care office returned call. States that pt is cleared for exercise. Requested release for activity signed by MD and fax number given. Awaiting release.   Joycelyn Man RN, BSN Cardiac and Pulmonary Rehab RN

## 2018-08-24 ENCOUNTER — Telehealth: Payer: Self-pay | Admitting: Internal Medicine

## 2018-08-24 NOTE — Telephone Encounter (Addendum)
[name]  She appears to have worsening ILD. There is some ggo - so you can try a 2-3 week prednisone course.   Also if diast dysfn, ensure adequate magmt for that through cards. Also refer to [facility] for [facility] or whoever her cardiologist is ([facility] on [address])  I will see her in 4 weeks from when you see her in ILD clinic - will consider ofev at that time for non-IPF progressive ILD (hopefully approved by then)    Thanks  MR  PS: you are seeing her 08/29/08

## 2018-08-26 NOTE — Telephone Encounter (Signed)
Thanks for sending, Noted. Will coordinate and discuss with patient on 08/29/18.  [name]

## 2018-08-28 NOTE — Progress Notes (Signed)
_0  ID: [name], female    DOB: March 05, 1946, 73 y.o.   MRN: [id number]  Chief Complaint  Patient presents with  . Follow-up    Unable to complete PFT today     Referring provider: Leonard Downing, *  HPI:  73 year old female never smoker followed in our office for allergic rhinitis, rhinosinusitis, asthmatic bronchitis, ILD  PMH: GERD, rheumatoid arthritis, ASCVD Smoker/ Smoking History: Never smoker Maintenance: Plaquenil, Anoro Ellipta, Arava Pt of: Dr. Annamaria Boots, [name] for ILD  08/29/2018  - Visit   73 year old female never smoker presenting to our office today for a pulmonary function test (spirometry with DLCO).  Unfortunately patient was unable to complete spirometry or DLCO today.  Most recent high-res CT shows potential worsening of interstitial lung disease with GGO.  Patient presents today with her spouse.  Both are frustrated regarding patient's worsened breathing and clinical picture.  They have follow-up scheduled with rheumatology in March/2020.  Patient reporting that she has been contacted by pulmonary rehab and was told that she was initially cleared to complete pulmonary rehab but then she has not been able to get back in touch with pulmonary rehab and they are requesting contact information today.  Patient also has concerns regarding oxygen levels in the morning.  Patient reports that she has been adherent to using her CPAP as well as 4 L of O2 at night but she is now waking up and her oxygen levels have been around 85% when she checks with her own pulse ox.  This is with her having nail polish on her fingers.  Patient was scheduled to complete spirometry with DLCO today but unfortunately was a unable to complete this.  Patient reports adherence to her medications with the exception of today she did not use her therapy vest.  Patient does report this is confirmed by her spouse in the room adherence using her therapy vest other days.      Tests:     PFT 09/03/2015-mild obstructive airways disease, minimal restriction, mild diffusion defect. FVC 2.17/83%, FEV1 1.80/92%, ratio 0.83. No response to bronchodilator. TLC 78%, DLCO 73%.  Office Spirometry 02/21/17-moderate restriction consistent with body habitus. FVC 1.70/66%, FEV1 1.49/77%, ratio 0.87, FEF 25-75% 2.39/146%.  Unattended Home Sleep Test 08/12/2016-AHI 18.2/hour, desaturation to 75% with average 88%, body weight 180 pounds CPAP titration sleep study 11/09/16.-6 cwp-  Needed supplemental oxygen because of sustained low saturation with 17.5 minutes recording saturation less than 88% on CPAP  Imaging:   12/30/2016-chest x-ray- no active cardiopulmonary disease 03/14/2016-CT chest high-res- mild basilar predominant subpleural reticulation and traction bronchiolectasis, scattered tiny pulmonary nodules measuring 4 mm or less 02/02/2018-CT chest high-res-Spectrum of findings compatible for UIP, presumably on the basis of patient's history of rheumatoid arthritis, mild patchy air trapping, stable dilated main pulmonary artery, traction bronchiectasis, tiny area of honeycombing in the right upper lobe 08/20/2018-CT chest high-res- severe tracheomalacia, interval progression of basilar predominant fibrotic interstitial lung disease with particularly increased groundglass component no honeycombing, 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 on the basis of patient's rheumatoid arthritis, stable dilated main pulmonary artery  Cardiac:  Nuclear stress test 05/20/2016-no evidence of significant blockage Echocardiogram 05/11/2016- WNL EF 56-81%, grade 1 diastolic dysfunction  Micro: 09/07/2016-sputum culture-abundant gram-positive cocci in pairs and clusters, scant growth of yeast    FENO:  Lab Results  Component Value Date   NITRICOXIDE 23 07/31/2018    PFT: PFT Results Latest Ref Rng & Units 09/03/2015  FVC-Pre L 2.18  FVC-Predicted  Pre % 84  FVC-Post L 2.17  FVC-Predicted Post % 83  Pre  FEV1/FVC % % 79  Post FEV1/FCV % % 83  FEV1-Pre L 1.73  FEV1-Predicted Pre % 88  FEV1-Post L 1.80  DLCO UNC% % 73  DLCO COR %Predicted % 94  TLC L 3.63  TLC % Predicted % 78  RV % Predicted % 61    Imaging: Ct Chest High Resolution  Result Date: 08/20/2018 CLINICAL DATA:  Dyspnea and hypoxia for several months. Rheumatoid arthritis. Follow-up interstitial lung disease. EXAM: CT CHEST WITHOUT CONTRAST TECHNIQUE: Multidetector CT imaging of the chest was performed following the standard protocol without intravenous contrast. High resolution imaging of the lungs, as well as inspiratory and expiratory imaging, was performed. COMPARISON:  02/02/2018 high-resolution chest CT. 07/02/2018 chest radiograph. FINDINGS: Cardiovascular: Normal heart size. No significant pericardial effusion/thickening. Left main and 3 vessel coronary atherosclerosis. Atherosclerotic nonaneurysmal thoracic aorta. Stable dilated main pulmonary artery (3.5 cm diameter). Mediastinum/Nodes: No discrete thyroid nodules. Unremarkable esophagus. No pathologically enlarged axillary, mediastinal or hilar lymph nodes, noting limited sensitivity for the detection of hilar adenopathy on this noncontrast study. Lungs/Pleura: No pneumothorax. No pleural effusion. No acute consolidative airspace disease or lung masses. Mild patchy air trapping in both lungs is unchanged. 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 There is basilar predominant patchy subpleural and peripheral peribronchovascular reticulation and ground-glass opacity in both lungs, with associated mild traction bronchiectasis and mild architectural distortion. These findings have worsened since 02/02/2018 chest CT, with particularly increased ground-glass component. No frank honeycombing. A few scattered small solid pulmonary nodules in the right lung measuring up to the 4 mm in the right upper lobe (series 4/image 41) are all stable since 03/14/2016  chest CT, considered benign. No new significant pulmonary nodules. Upper abdomen: Cholecystectomy. Musculoskeletal: No aggressive appearing focal osseous lesions. Moderate thoracic spondylosis. IMPRESSION: 1. Severe tracheobronchomalacia, more pronounced on today's scan. 2. Interval progression of basilar predominant fibrotic interstitial lung disease with particularly increased ground-glass component. No honeycombing. 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 Findings are consistent with UIP per consensus guidelines: Diagnosis of Idiopathic Pulmonary Fibrosis: An Official ATS/ERS/JRS/ALAT Clinical Practice Guideline. Goehner, Iss 5, [phone], Mar 18 2017. 3. Stable dilated main pulmonary artery, suggesting chronic pulmonary arterial hypertension. 4. Left main and 3 vessel coronary atherosclerosis. Aortic Atherosclerosis (ICD10-I70.0). Electronically Signed   By: Ilona Sorrel M.D.   On: 08/20/2018 08:21      Specialty Problems      Pulmonary Problems   ASTHMATIC BRONCHITIS, ACUTE    Qualifier: Diagnosis of  By: Annamaria Boots MD, Clinton D       Seasonal and perennial allergic rhinitis    Allergy vaccine 1:50,000 07/13/00; 1:10 06/11/08 GO       RHINOSINUSITIS, CHRONIC    Qualifier: Diagnosis of  By: Annamaria Boots MD, Clinton D       Asthmatic bronchitis, moderate persistent, uncomplicated    Office Spirometry 02/21/17-moderate restriction consistent with body habitus. FVC 1.70/66%, FEV1 1.49/77%, ratio 0.87, FEF 25-75% 2.39/146%.  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 on the basis of patient's rheumatoid arthritis, stable dilated main pulmonary artery      Obstructive sleep apnea    Unattended Home Sleep Test 08/12/2016-AHI 18.2/hour, desaturation to 75% with average 88%, body weight 180 pounds CPAP titration sleep study 11/09/16.-6 cwp-  Needed supplemental oxygen because of sustained low saturation with 17.5 minutes recording saturation less than 88% on CPAP      Shortness of breath   Acute on chronic respiratory failure with hypoxia (HCC)   Bronchiectasis without complication ([place])    5/88/5027-XA chest high-res-Spectrum of findings compatible for UIP, presumably on the basis of patient's history of rheumatoid arthritis, mild patchy air trapping, stable dilated main pulmonary artery, traction bronchiectasis, tiny area of honeycombing in the right upper lobe         Allergies  Allergen Reactions  . Cefdinir Diarrhea  . Erythromycin Base Diarrhea    Other  . Lactose Intolerance (Gi) Diarrhea  . Lactulose Diarrhea  . Mesalamine Nausea Only  . Nitrofuran Derivatives     shakiness  . Nitrofurantoin Other (See Comments)    shakiness other  . Other Diarrhea and Other (See Comments)    Shaking uncontrollably "lettuce only" "lettuce only"  . Sulfa Antibiotics Other (See Comments)    Patient can't recall reaction   . Tdap [Tetanus-Diphth-Acell Pertussis]     Shaking uncontrollably  . Tetanus Toxoid, Adsorbed Other (See Comments)    Shaking uncontrollable     Immunization History  Administered Date(s) Administered  . Influenza Split 03/18/2012, 03/18/2013, 04/14/2014, 04/17/2017  . Influenza Whole 04/30/2008, 04/20/2009, 04/18/2011  . Influenza, High Dose Seasonal PF 04/23/2015, 03/24/2016, 04/06/2018  .  Pneumococcal Conjugate-13 11/11/2013  . Pneumococcal Polysaccharide-23 05/21/2018  . Zoster Recombinat (Shingrix) 05/05/2017   Up-to-date  Past Medical History:  Diagnosis Date  . Acute asthmatic bronchitis   . Allergic rhinitis   . Esophageal reflux   . Irritable bowel syndrome   . Rheumatoid arthritis(714.0)     Tobacco History: Social History   Tobacco Use  Smoking Status Never Smoker  Smokeless Tobacco Never Used  Tobacco Comment   positive passive tobacco smoke exposure   Counseling given: Yes Comment: positive passive tobacco smoke exposure  Continue to not smoke  Outpatient Encounter Medications as of 08/29/2018  Medication Sig  . ALPRAZolam (XANAX) 0.5 MG tablet Take 0.5 mg by mouth as needed.  [name] ELLIPTA 62.5-25 MCG/INH AEPB USE 1 INHALATION DAILY  . aspirin EC 81 MG tablet Take 81 mg by mouth daily.  . benzonatate (TESSALON) 200 MG capsule TAKE 1 CAPSULE BY MOUTH 3 TIMES A DAY AS NEEDED FOR COUGH  . cetirizine (ZYRTEC) 10 MG tablet Take 10 mg by mouth daily.  . clidinium-chlordiazePOXIDE (LIBRAX) 5-2.5 MG capsule Take 1 capsule by mouth 2 (two) times daily as needed (for IBS).  . colestipol (COLESTID) 1 G tablet Take 1 g by mouth 2 (two) times daily.   . [employer] D3 5000 units capsule Take 5,000 Units by mouth daily.  Marland Kitchen FLUoxetine (PROZAC) 20 MG capsule Take 20 mg by mouth daily.  . fluticasone (FLONASE) 50 MCG/ACT nasal spray Place 2 sprays into the nose daily.   . furosemide (LASIX) 40 MG tablet TAKE 1 TABLET BY MOUTH TWO  TIMES DAILY  . glycopyrrolate (ROBINUL) 1 MG tablet Take  1 mg by mouth 3 (three) times daily as needed (for IBS).   . hydroxychloroquine (PLAQUENIL) 200 MG tablet Take 600 mg by mouth daily.   . hydroxypropyl methylcellulose / hypromellose (ISOPTO TEARS / GONIOVISC) 2.5 % ophthalmic solution Place 1-2 drops into both eyes 3 (three) times daily as needed for dry eyes.  Marland Kitchen ibuprofen (ADVIL,MOTRIN) 400 MG tablet Take 400 mg by mouth as needed.    Marland Kitchen ipratropium (ATROVENT) 0.03 % nasal spray USE 1 TO 2 SPRAYS INTO BOTH NOSTRILS 2 TIMES DAILY.  Marland Kitchen ipratropium-albuterol (DUONEB) 0.5-2.5 (3) MG/3ML SOLN Take 3 mLs by nebulization every 6 (six) hours as needed.  . leflunomide (ARAVA) 20 MG tablet Take 20 mg by mouth daily.   . mirtazapine (REMERON) 45 MG tablet Take 45 mg by mouth at bedtime.  . montelukast (SINGULAIR) 10 MG tablet TAKE 1 TABLET BY MOUTH AT  BEDTIME  . Multiple Vitamin (MULTIVITAMIN) tablet Take 1 tablet by mouth daily.  Marland Kitchen omeprazole (PRILOSEC) 40 MG capsule Take 40 mg by mouth daily.  . potassium chloride SA (K-DUR,KLOR-CON) 20 MEQ tablet Take 1 tablet (20 mEq total) by mouth 3 (three) times daily.  Marland Kitchen PROAIR HFA 108 (90 Base) MCG/ACT inhaler USE 2 PUFFS BY MOUTH EVERY  6 HOURS AS NEEDED  . rosuvastatin (CRESTOR) 20 MG tablet TAKE 1 TABLET BY MOUTH  DAILY  . Spacer/Aero-Holding Chambers (AEROCHAMBER MV) inhaler Use as instructed  . traMADol (ULTRAM) 50 MG tablet Take 50 mg by mouth 2 (two) times daily.   . verapamil (CALAN-SR) 240 MG CR tablet Take 240 mg by mouth daily.  Marland Kitchen amoxicillin-clavulanate (AUGMENTIN) 875-125 MG tablet Take 1 tablet by mouth 2 (two) times daily.  . predniSONE (DELTASONE) 10 MG tablet Take as instructed on AVS.  . [DISCONTINUED] potassium chloride SA (K-DUR,KLOR-CON) 20 MEQ tablet TAKE 1 TABLET BY MOUTH 3  TIMES DAILY  . [DISCONTINUED] promethazine-codeine (PHENERGAN WITH CODEINE) 6.25-10 MG/5ML syrup Take 5 mLs by mouth every 12 hours as needed for up to 10 days for cough. (Patient not taking: Reported on 07/31/2018)   No facility-administered encounter medications on file as of 08/29/2018.      Review of Systems  Review of Systems  Constitutional: Positive for fatigue. Negative for chills, fever and unexpected weight change.  HENT: Negative for congestion, ear pain, postnasal drip, sinus pressure and sinus pain.   Respiratory: Positive for cough and wheezing. Negative for chest tightness and  shortness of breath.   Cardiovascular: Negative for chest pain and palpitations.  Gastrointestinal: Negative for diarrhea, nausea and vomiting.  Skin: Negative for color change.  Allergic/Immunologic: Negative for environmental allergies and food allergies.  Neurological: Negative for dizziness, light-headedness and headaches.  Psychiatric/Behavioral: Negative for dysphoric mood. The patient is not nervous/anxious.   All other systems reviewed and are negative.    Physical Exam  BP 140/68 (BP Location: Left Arm, Cuff Size: Normal)   Pulse 68   Temp 98.3 F (36.8 C) (Oral)   Ht _0  (1.549 m)   Wt 199 lb 12.8 oz (90.6 kg)   SpO2 100%   BMI 37.75 kg/m   Wt Readings from Last 5 Encounters:  08/29/18 199 lb 12.8 oz (90.6 kg)  07/31/18 205 lb (93 kg)  07/02/18 203 lb 6.4 oz (92.3 kg)  06/08/18 202 lb (91.6 kg)  05/21/18 202 lb 12.8 oz (92 kg)     Physical Exam  Constitutional: She is oriented to person, place, and time and well-developed, well-nourished, and in no distress.  No distress.  HENT:  Head: Normocephalic and atraumatic.  Right Ear: Hearing, tympanic membrane, external ear and ear canal normal.  Left Ear: Hearing, tympanic membrane, external ear and ear canal normal.  Nose: Mucosal edema present. Right sinus exhibits no maxillary sinus tenderness and no frontal sinus tenderness. Left sinus exhibits no maxillary sinus tenderness and no frontal sinus tenderness.  Mouth/Throat: Uvula is midline and oropharynx is clear and moist. No oropharyngeal exudate.  + Postnasal drip  Eyes: Pupils are equal, round, and reactive to light.  Neck: Normal range of motion. Neck supple.  Cardiovascular: Normal rate, regular rhythm and normal heart sounds.  Pulmonary/Chest: Effort normal. No accessory muscle usage. No respiratory distress. She has no decreased breath sounds. She has wheezes (insp / exp wheezes ). She has no rhonchi.  + Slight barrel chest + Bibasilar crackles  Abdominal:  Soft. Bowel sounds are normal.  Truncal obesity  Musculoskeletal: Normal range of motion.        General: No edema.  Lymphadenopathy:    She has no cervical adenopathy.  Neurological: She is alert and oriented to person, place, and time. Gait normal.  Skin: Skin is warm and dry. She is not diaphoretic. No erythema. Nails show no clubbing.  +chronic RA changes bilaterally in hands   Psychiatric: Memory, affect and judgment normal. Her mood appears anxious.  Nursing note and vitals reviewed.     Lab Results:  CBC    Component Value Date/Time   WBC 9.0 07/31/2018 1735   RBC 4.30 07/31/2018 1735   HGB 13.0 07/31/2018 1735   HGB 14.4 09/23/2016 1252   HCT 40.0 07/31/2018 1735   HCT 44.7 09/23/2016 1252   PLT 272.0 07/31/2018 1735   PLT 342 09/23/2016 1252   MCV 93.2 07/31/2018 1735   MCV 88 09/23/2016 1252   MCH 28.3 09/23/2016 1252   MCH 29.6 08/03/2013 0548   MCHC 32.4 07/31/2018 1735   RDW 13.9 07/31/2018 1735   RDW 15.4 09/23/2016 1252   LYMPHSABS 1.3 07/31/2018 1735   LYMPHSABS 1.2 09/23/2016 1252   MONOABS 0.9 07/31/2018 1735   EOSABS 0.6 07/31/2018 1735   EOSABS 0.0 09/23/2016 1252   BASOSABS 0.1 07/31/2018 1735   BASOSABS 0.0 09/23/2016 1252    BMET    Component Value Date/Time   NA 138 07/31/2018 1735   NA 142 01/09/2017 1024   K 4.4 07/31/2018 1735   CL 101 07/31/2018 1735   CO2 29 07/31/2018 1735   GLUCOSE 101 (H) 07/31/2018 1735   BUN 17 07/31/2018 1735   BUN 18 01/09/2017 1024   CREATININE 1.02 07/31/2018 1735   CREATININE 0.91 05/30/2016 1002   CALCIUM 10.3 07/31/2018 1735   GFRNONAA 62 01/09/2017 1024   GFRAA 72 01/09/2017 1024    BNP No results found for: BNP  ProBNP    Component Value Date/Time   PROBNP 38.0 07/31/2018 1735      Assessment & Plan:     Bronchitis, chronic obstructive, with exacerbation Assessment:  Inspiratory and expiratory wheezes on exam today Bibasilar crackles Increased cough over the past 2 weeks Clear  to yellow mucus Nonadherence to therapy vest this morning Unable to complete pulmonary function test GGO on recent high-res CT  Plan: Extended prednisone taper today Augmentin for 7 days Maintain adherence to therapy vest Follow-up with [name] in 4 weeks Reschedule spirometry with DLCO in 4 weeks  Obstructive sleep apnea Assessment: Patient reports compliance with CPAP Patient currently using 4 L of O2 at  night Patient reporting that oxygenation in the morning is around 85% based off of personal SPO2  Plan: Continue CPAP use Continue 4 L of O2 with CPAP Overnight oximetry test ordered >>>To be completed with 4 L O2 as well as with CPAP Contact aero care to ensure you have the right tubing for your CPAP and oxygen  Acute on chronic respiratory failure with hypoxia (HCC) Assessment: Maintained on 4 L O2 at night Patient reporting oxygen saturations in the morning around 85% February/2020-worsened high-res CT  Plan: We will order overnight oximetry for patient to complete while wearing CPAP as well as 4 L of O2 to ensure oxygenation is stable   Interstitial pulmonary disease (HCC) Assessment: February/2020 high-res CT shows worsened ILD with UIP pattern GGO on recent high-res CT Patient with known diagnosis of rheumatoid arthritis managed by Dr. Trudie Reed Patient is currently on Plaquenil as well as Arava Bibasilar crackles on exam today Inspiratory and expiratory wheezes on exam today Patient unable to complete spirometry with DLCO this morning  Plan: Prednisone taper Contact rheumatology to notify them that CT is showing progression Follow-up in 4 weeks with [name] to consider anti-fibrotic's >>> Patient with known chronic IBS sent May be better candidate for Esbriet Continue with pulmonary rehab Reschedule spirometry with DLCO  Rheumatoid arthritis ([place]) Assessment: Currently maintained on Plaquenil as well as [place] by Dr.  Trudie Reed  Plan: Continue follow-up with Dr. Trudie Reed Prednisone taper today Notify Dr. Trudie Reed that high-resolution CT showing progression of ILD with UIP   Pulmonary hypertension ([place]) Assessment: Suspected pulmonary hypertension on high-resolution CT  Plan: Referral to cardiology for Dr. Marigene Ehlers or Dr. Haroldine Laws  Bronchiectasis without complication [facility]) Assessment: Previous CT showing traction bronchiectasis Patient has therapy vest at home  Plan: Continue therapy vest as prescribed      Lauraine Rinne, NP 08/29/2018   This appointment was 45 min long with over 50% of the time in direct face-to-face patient care, assessment, plan of care, and follow-up.

## 2018-08-29 ENCOUNTER — Ambulatory Visit (INDEPENDENT_AMBULATORY_CARE_PROVIDER_SITE_OTHER): Payer: Medicare Other | Admitting: Pulmonary Disease

## 2018-08-29 ENCOUNTER — Ambulatory Visit (INDEPENDENT_AMBULATORY_CARE_PROVIDER_SITE_OTHER): Payer: Medicare Other | Admitting: Internal Medicine

## 2018-08-29 ENCOUNTER — Encounter: Payer: Self-pay | Admitting: Pulmonary Disease

## 2018-08-29 VITALS — BP 140/68 | HR 68 | Temp 98.3°F | Ht 61.0 in | Wt 199.8 lb

## 2018-08-29 DIAGNOSIS — J441 Chronic obstructive pulmonary disease with (acute) exacerbation: Secondary | ICD-10-CM

## 2018-08-29 DIAGNOSIS — G4733 Obstructive sleep apnea (adult) (pediatric): Secondary | ICD-10-CM | POA: Diagnosis not present

## 2018-08-29 DIAGNOSIS — I272 Pulmonary hypertension, unspecified: Secondary | ICD-10-CM

## 2018-08-29 DIAGNOSIS — J9621 Acute and chronic respiratory failure with hypoxia: Secondary | ICD-10-CM | POA: Diagnosis not present

## 2018-08-29 DIAGNOSIS — J849 Interstitial pulmonary disease, unspecified: Secondary | ICD-10-CM

## 2018-08-29 DIAGNOSIS — J479 Bronchiectasis, uncomplicated: Secondary | ICD-10-CM | POA: Insufficient documentation

## 2018-08-29 DIAGNOSIS — M069 Rheumatoid arthritis, unspecified: Secondary | ICD-10-CM

## 2018-08-29 MED ORDER — AMOXICILLIN-POT CLAVULANATE 875-125 MG PO TABS: 1.0000 | ORAL_TABLET | Freq: Two times a day (BID) | ORAL | 0 refills | Status: DC

## 2018-08-29 MED ORDER — PREDNISONE 10 MG PO TABS: ORAL_TABLET | ORAL | 0 refills | Status: DC

## 2018-08-29 NOTE — Telephone Encounter (Signed)
Discussed results with patient in office today.  Unfortunately patient could not complete spirometry with DLCO today.  We have rescheduled this to be completed in 4 weeks prior to your next office visit.Plan:Extended prednisone course Augmentin 7 days Patient to contact rheumatology who she scheduled to see in March/2020 to discuss worsened CTReferred patient for local cardiologist ([place] or [place]) Patient and spouse are both interested in starting anti-fibrotic's as soon as possible.   >>>Patient with chronic IBS so may be better candidate for esbriet >>> Patient and spouse would like to start the paperwork ASAP and not wait for a 4-week follow-up visit Patient maintained on CPAP at night as well as 4 L O2 by patient reporting oxygen saturations in mid 80s when she is waking up  >>>overnight oximetry has been ordered Rescheduled spirometry with DLCO in 4 weeksFollow-up with you in ILD clinic in 5 weeks    Let me know your thoughts on starting anti fibrotic paperwork,  Wyn Quaker FNP

## 2018-08-29 NOTE — Patient Instructions (Addendum)
Antifibrotic medications to consider: Esbriet or OFEV  >>>Esbriet more nausea  >>>OFEV more diarrhea   Augmentin >>> Take 1 875-125 mg tablet every 12 hours for the next 7 days >>> Take with food  Prednisone 59m tablet  >>>4 tabs for 5 days, then 3 tabs for 5 days, 2 tabs for 5 days, then 1 tab for 5 days, then stop >>>take with food  >>>take in the morning    Reschedule pulmonary function test >>>Spirometry with DLCO in 3 to 4 weeks before seeing Dr. RChase Caller Follow-up with Dr. RChase Callerin 4-5 weeks   Continue Anoro Ellipta  >>> Take 1 puff daily in the morning right when you wake up >>>Rinse your mouth out after use >>>This is a daily maintenance inhaler, NOT a rescue inhaler >>>Contact our office if you are having difficulties affording or obtaining this medication >>>It is important for you to be able to take this daily and not miss any doses    Referral to cardiology - Dr. MAundra Dubinor Dr. BTempie Hoistper Dr. RRosalin Hawkingrheumatology Dr. HHassel Nethoffice to let them know that your CT has worsened >>>Keep follow up with Dr. HTrudie Reed Continue Pulmonary Rehab as planned  >>> [phone] - ask for LRia Commentfor pulm rehab follow up   Contact [facility] regarding CPAP tubing  >>[phone]  Overnight Oximetry ordered  >>>Completed on 4L and with CPAP      We recommend that you continue using your CPAP daily >>>Keep up the hard work using your device >>> Goal should be wearing this for the entire night that you are sleeping, at least 4 to 6 hours  Remember:  . Do not drive or operate heavy machinery if tired or drowsy.  . Please notify the supply company and office if you are unable to use your device regularly due to missing supplies or machine being broken.  . Work on maintaining a healthy weight and following your recommended nutrition plan  . Maintain proper daily exercise and movement  . Maintaining proper use of your device can also help improve management  of other chronic illnesses such as: Blood pressure, blood sugars, and weight management.   BiPAP/ CPAP Cleaning:  >>>Clean weekly, with Dawn soap, and bottle brush.  Set up to air dry.   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 Use a flutter valve 10 breaths twice a day or 4 to 5 breaths 4-5 times a day to help clear mucus out Let [facility] if you have cough with change in mucus color or fevers or chills.  At that point you would need an antibiotic. Maintain a healthy nutritious diet, eating whole foods Take your medications as prescribed      It is flu season:   >>>Remember to be washing your hands regularly, using hand sanitizer, be careful to use around herself with has contact with people who are sick will increase her chances of getting sick yourself. >>> Best ways to protect herself from the flu: Receive the yearly flu vaccine, practice good hand hygiene washing with soap and also using hand sanitizer when available, eat a nutritious meals, get adequate rest, hydrate appropriately   Please contact the office if your symptoms worsen or you have concerns that you are not improving.   Thank you for choosing [facility] for your healthcare, and for allowing [facility] partner with you on your healthcare journey.  I am thankful to be able to provide care to you today.   Wyn Quaker FNP-C

## 2018-08-29 NOTE — Assessment & Plan Note (Addendum)
Assessment: Previous CT showing traction bronchiectasis Patient has therapy vest at home  Plan: Continue therapy vest as prescribed

## 2018-08-29 NOTE — Assessment & Plan Note (Signed)
Assessment: Patient reports compliance with CPAP Patient currently using 4 L of O2 at night Patient reporting that oxygenation in the morning is around 85% based off of personal SPO2  Plan: Continue CPAP use Continue 4 L of O2 with CPAP Overnight oximetry test ordered >>>To be completed with 4 L O2 as well as with CPAP Contact [facility] to ensure you have the right tubing for your CPAP and oxygen

## 2018-08-29 NOTE — Assessment & Plan Note (Signed)
Assessment: Maintained on 4 L O2 at night Patient reporting oxygen saturations in the morning around 85% February/2020-worsened high-res CT  Plan: We will order overnight oximetry for patient to complete while wearing CPAP as well as 4 L of O2 to ensure oxygenation is stable

## 2018-08-29 NOTE — Assessment & Plan Note (Signed)
Assessment:  Inspiratory and expiratory wheezes on exam today Bibasilar crackles Increased cough over the past 2 weeks Clear to yellow mucus Nonadherence to therapy vest this morning Unable to complete pulmonary function test GGO on recent high-res CT  Plan: Extended prednisone taper today Augmentin for 7 days Maintain adherence to therapy vest Follow-up with [name] in 4 weeks Reschedule spirometry with DLCO in 4 weeks

## 2018-08-29 NOTE — Progress Notes (Signed)
Pt attempted PFT today but was unable to complete test at all. No charge for test.

## 2018-08-29 NOTE — Assessment & Plan Note (Signed)
Assessment: Suspected pulmonary hypertension on high-resolution CT  Plan: Referral to cardiology for Dr. Marigene Ehlers or Dr. Haroldine Laws

## 2018-08-29 NOTE — Assessment & Plan Note (Signed)
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 Trudie Reed Patient is currently on Plaquenil as well as Arava Bibasilar crackles on exam today Inspiratory and expiratory wheezes on exam today Patient unable to complete spirometry with DLCO this morning  Plan: Prednisone taper Contact rheumatology to notify them that CT is showing progression Follow-up in 4 weeks with [name] to consider anti-fibrotic's >>> Patient with known chronic IBS sent May be better candidate for Esbriet Continue with pulmonary rehab Reschedule spirometry with DLCO

## 2018-08-29 NOTE — Assessment & Plan Note (Signed)
Assessment: Currently maintained on Plaquenil as well as [place] by Dr. Trudie Reed  Plan: Continue follow-up with Dr. Trudie Reed Prednisone taper today Notify Dr. Trudie Reed that high-resolution CT showing progression of ILD with UIP

## 2018-08-30 ENCOUNTER — Telehealth: Payer: Self-pay | Admitting: Pulmonary Disease

## 2018-08-30 DIAGNOSIS — J849 Interstitial pulmonary disease, unspecified: Secondary | ICD-10-CM

## 2018-08-30 NOTE — Telephone Encounter (Signed)
The indication would be based on inbuild study that is non-IPF progressive fibrotic disease. So, waiting on insurance approval on this indication. So best to wait

## 2018-08-30 NOTE — Telephone Encounter (Signed)
ATC pt, received a busy signal x3. Will try back.

## 2018-08-31 ENCOUNTER — Telehealth (HOSPITAL_COMMUNITY): Payer: Self-pay

## 2018-08-31 NOTE — Addendum Note (Signed)
Addended by: Jannette Spanner on: 08/31/2018 01:56 PM   Modules accepted: Orders

## 2018-08-31 NOTE — Telephone Encounter (Signed)
Spoke with pt, she would like [place] to transfer her CPAP from [facility] to [place]. She is requesting a POC order to be sent to [facility]. [name] ok to send orders? Please advise.

## 2018-08-31 NOTE — Telephone Encounter (Signed)
Yes okay to sign that.   [name]

## 2018-08-31 NOTE — Telephone Encounter (Signed)
Pt returned PR phone call, pt will come in for orientation 09/10/2018 @ 11AM and attend the 1:30PM class time.

## 2018-08-31 NOTE — Telephone Encounter (Signed)
[name]

## 2018-08-31 NOTE — Telephone Encounter (Signed)
Ok order placed. Pt made aware. Nothing further is needed.

## 2018-08-31 NOTE — Telephone Encounter (Signed)
08/31/2018 1202  Called and spoke to the patient updated her regarding the discussion about starting anti-fibrotic's.  We will hold off on starting anti-fibrotic's until the indication for insurance approval.  Patient will keep follow-up with [name].  I did speak with the patient regarding her request to [facility].  I am fine with that.  I am more than happy to sign those orders.  She is also requesting an order for a POC from [facility].  I discussed with her that that is pulsed air oxygen so with her using 4 L of oxygen currently she may not be a candidate for a POC.  I am fine signing the order for her to be process for that order for her to get a walk done in our office to ensure that she gets adequate oxygenation.  I believe this would help with her physical mobility and overall quality life.  All questions were answered.  Patient knows to follow-up with our office if they have any questions or concerns.  Wyn Quaker, FNP

## 2018-08-31 NOTE — Addendum Note (Signed)
Addended by: Jannette Spanner on: 08/31/2018 12:35 PM   Modules accepted: Orders

## 2018-08-31 NOTE — Telephone Encounter (Signed)
Pt insurance is active and benefits verified through Medicare A/B. Co-pay $0.00, DED $198.00/$0.00 met, out of pocket $0.00/$0.00 met, co-insurance 20%. No pre-authorization required.   2ndary insurance is active and benefits verified through [employer]. Co-pay $0.00, DED $0.00/$0.00 met, out of pocket $0.00/$0.00 met, co-insurance 0%. No pre-authorization required.

## 2018-09-10 ENCOUNTER — Encounter (HOSPITAL_COMMUNITY)
Admission: RE | Admit: 2018-09-10 | Discharge: 2018-09-10 | Disposition: A | Discharge status: Home / Self Care | Payer: Medicare Other | Source: Ambulatory Visit | Attending: Internal Medicine | Admitting: Internal Medicine

## 2018-09-10 VITALS — BP 131/79 | HR 92 | Ht 61.0 in | Wt 196.2 lb

## 2018-09-10 DIAGNOSIS — J849 Interstitial pulmonary disease, unspecified: Secondary | ICD-10-CM

## 2018-09-10 NOTE — Progress Notes (Signed)
[name] 73 y.o. female Pulmonary Rehab Orientation Note Patient arrived today in Cardiac and Pulmonary Rehab for orientation to Pulmonary Rehab. She was transported from [employer] via wheel chair. She does carry portable oxygen. Per pt, she uses oxygen continuously. Color good, skin warm and dry. Patient is oriented to time and place. Patient's medical history, psychosocial health, and medications reviewed. Psychosocial assessment reveals pt lives with their spouse. Pt is currently retired. She worked for [employer] at the [employer].  Pt hobbies include playing mahjohgg with a group of ladies. Pt reports her stress level is high. Areas of stress/anxiety include the inability to do the things she used to enjoy and her health.  Pt does exhibit signs of depression. Signs of depression include sadness and difficulty falling asleep. PHQ2/9 score 1/7. She sees her PCP, Dr. Arelia Sneddon for depression and is on antidepressant medications.  I suggested seeing a counsellor to help her deal with chronic illnesses and her depression.  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 Pt shows good  coping skills with positive outlook .  Offered emotional support and reassurance. Will continue to monitor and evaluate progress toward psychosocial goal(s) of developing healthy habits in dealing with stress. Physical assessment reveals heart rate is normal, breath sounds clear with crackles in the bases. Grip strength equal, strong. Patient reports she does take medications as prescribed. Patient states she follows a Regular diet. The patient has been trying to lose weight through a healthy diet and exercise program.. Patient's weight will be monitored closely. Demonstration and practice of PLB using pulse oximeter. Patient able to return demonstration satisfactorily. Safety and hand hygiene in the exercise area reviewed with patient. Patient voices  understanding of the information reviewed. Department expectations discussed with patient and achievable goals were set. The patient shows enthusiasm about attending the program and we look forward to working with this nice lady. The patient is scheduled for a 6 min walk test on Thursday, September 13, 2018 @ 12:15 pm and to begin exercise on Thursday, September 20, 2018 in the 1030 class.  1045-1300 time spent with orientation to pulmonary rehab

## 2018-09-12 ENCOUNTER — Other Ambulatory Visit: Payer: Self-pay | Admitting: Internal Medicine

## 2018-09-12 DIAGNOSIS — I2510 Atherosclerotic heart disease of native coronary artery without angina pectoris: Secondary | ICD-10-CM

## 2018-09-12 DIAGNOSIS — E785 Hyperlipidemia, unspecified: Secondary | ICD-10-CM

## 2018-09-13 ENCOUNTER — Encounter (HOSPITAL_COMMUNITY)
Admission: RE | Admit: 2018-09-13 | Discharge: 2018-09-13 | Disposition: A | Discharge status: Home / Self Care | Payer: Medicare Other | Source: Ambulatory Visit | Attending: Internal Medicine | Admitting: Internal Medicine

## 2018-09-13 ENCOUNTER — Telehealth: Payer: Self-pay | Admitting: Internal Medicine

## 2018-09-13 DIAGNOSIS — J849 Interstitial pulmonary disease, unspecified: Secondary | ICD-10-CM | POA: Diagnosis not present

## 2018-09-13 NOTE — Telephone Encounter (Signed)
Called pt to change appt time from 2:00p to 3:00p for PFT on 10/01/2018. While on the phone pt states she has yet to hear back from anyone regarding referral to [place] or Aundra Dubin, will forward to [facility] look into.   Pt also questioning if she needs to keep her appt with CY on 09/24/2018 when she has a PFT and OV with MR on 3/16 and 3/18.  Dr. Annamaria Boots please advise about appt, does she need to keep the f/u with you? Thanks.

## 2018-09-13 NOTE — Telephone Encounter (Signed)
Does not need the march appointment with me. She will decide with Dr [name] whether he will follow for anything other than ILD.

## 2018-09-13 NOTE — Progress Notes (Signed)
Pulmonary Individual Treatment Plan  Patient Details  Name: [name] MRN: [id number] Date of Birth: 04-01-1946 Referring Provider:     Pulmonary Rehab Walk Test from 09/13/2018 in [place]  Referring Provider  Dr. Annamaria Boots      Initial Encounter Date:    Pulmonary Rehab Walk Test from 09/13/2018 in [place]  Date  09/13/18      Visit Diagnosis: Interstitial lung disease ([place])  Patient's Home Medications on Admission:   Current Outpatient Medications:  .  ALPRAZolam (XANAX) 0.5 MG tablet, Take 0.5 mg by mouth as needed., Disp: , Rfl:  .  amoxicillin-clavulanate (AUGMENTIN) 875-125 MG tablet, Take 1 tablet by mouth 2 (two) times daily. (Patient not taking: Reported on 09/10/2018), Disp: 14 tablet, Rfl: 0 .  ANORO ELLIPTA 62.5-25 MCG/INH AEPB, USE 1 INHALATION DAILY, Disp: 180 each, Rfl: 2 .  aspirin EC 81 MG tablet, Take 81 mg by mouth daily., Disp: , Rfl:  .  benzonatate (TESSALON) 200 MG capsule, TAKE 1 CAPSULE BY MOUTH 3 TIMES A DAY AS NEEDED FOR COUGH, Disp: 30 capsule, Rfl: 3 .  cetirizine (ZYRTEC) 10 MG tablet, Take 10 mg by mouth daily., Disp: , Rfl:  .  clidinium-chlordiazePOXIDE (LIBRAX) 5-2.5 MG capsule, Take 1 capsule by mouth 2 (two) times daily as needed (for IBS)., Disp: , Rfl:  .  colestipol (COLESTID) 1 G tablet, Take 1 g by mouth 2 (two) times daily. , Disp: , Rfl:  .  [employer] D3 5000 units capsule, Take 5,000 Units by mouth daily., Disp: , Rfl: 2 .  FLUoxetine (PROZAC) 20 MG capsule, Take 20 mg by mouth daily., Disp: , Rfl:  .  fluticasone (FLONASE) 50 MCG/ACT nasal spray, Place 2 sprays into the nose daily. , Disp: , Rfl:  .  furosemide (LASIX) 40 MG tablet, TAKE 1 TABLET BY MOUTH TWO  TIMES DAILY, Disp: 180 tablet, Rfl: 1 .  glycopyrrolate (ROBINUL) 1 MG tablet, Take 1 mg by mouth 3 (three) times daily as needed (for IBS). , Disp: , Rfl:  .  hydroxychloroquine (PLAQUENIL) 200 MG tablet, Take 600 mg by  mouth daily. , Disp: , Rfl:  .  hydroxypropyl methylcellulose / hypromellose (ISOPTO TEARS / GONIOVISC) 2.5 % ophthalmic solution, Place 1-2 drops into both eyes 3 (three) times daily as needed for dry eyes., Disp: , Rfl:  .  ibuprofen (ADVIL,MOTRIN) 400 MG tablet, Take 400 mg by mouth as needed., Disp: , Rfl:  .  ipratropium (ATROVENT) 0.03 % nasal spray, USE 1 TO 2 SPRAYS INTO BOTH NOSTRILS 2 TIMES DAILY., Disp: 90 mL, Rfl: 1 .  ipratropium-albuterol (DUONEB) 0.5-2.5 (3) MG/3ML SOLN, Take 3 mLs by nebulization every 6 (six) hours as needed., Disp: 75 mL, Rfl: 12 .  leflunomide (ARAVA) 20 MG tablet, Take 20 mg by mouth daily. , Disp: , Rfl:  .  mirtazapine (REMERON) 45 MG tablet, Take 45 mg by mouth at bedtime., Disp: , Rfl:  .  montelukast (SINGULAIR) 10 MG tablet, TAKE 1 TABLET BY MOUTH AT  BEDTIME, Disp: 90 tablet, Rfl: 1 .  Multiple Vitamin (MULTIVITAMIN) tablet, Take 1 tablet by mouth daily., Disp: , Rfl:  .  omeprazole (PRILOSEC) 40 MG capsule, Take 40 mg by mouth daily., Disp: , Rfl:  .  potassium chloride SA (K-DUR,KLOR-CON) 20 MEQ tablet, TAKE 1 TABLET BY MOUTH 3  TIMES DAILY, Disp: 270 tablet, Rfl: 0 .  predniSONE (DELTASONE) 10 MG tablet, Take as instructed on  AVS., Disp: 60 tablet, Rfl: 0 .  PROAIR HFA 108 (90 Base) MCG/ACT inhaler, USE 2 PUFFS BY MOUTH EVERY  6 HOURS AS NEEDED, Disp: 25.5 g, Rfl: 3 .  rosuvastatin (CRESTOR) 20 MG tablet, TAKE 1 TABLET BY MOUTH  DAILY, Disp: 90 tablet, Rfl: 0 .  Spacer/Aero-Holding Chambers (AEROCHAMBER MV) inhaler, Use as instructed, Disp: 1 each, Rfl: 0 .  traMADol (ULTRAM) 50 MG tablet, Take 50 mg by mouth 2 (two) times daily. , Disp: , Rfl:  .  verapamil (CALAN-SR) 240 MG CR tablet, Take 240 mg by mouth daily., Disp: , Rfl:   Past Medical History: Past Medical History:  Diagnosis Date  . Acute asthmatic bronchitis   . Allergic rhinitis   . Esophageal reflux   . Irritable bowel syndrome   . Rheumatoid arthritis(714.0)     Tobacco  Use: Social History   Tobacco Use  Smoking Status Never Smoker  Smokeless Tobacco Never Used  Tobacco Comment   positive passive tobacco smoke exposure    Labs: Recent Review Flowsheet Data    Labs for ITP Cardiac and Pulmonary Rehab Latest Ref Rng & Units 11/15/2012 09/16/2016 09/16/2016 09/16/2016 01/09/2017   Cholestrol 100 - 199 mg/dL - - - - 127   LDLCALC 0 - 99 mg/dL - - - - 38   LDLDIRECT 0 - 99 mg/dL - - - - 53   HDL >39 mg/dL - - - - 53   Trlycerides 0 - 149 mg/dL - - - - 181(H)   Hemoglobin A1c 4.8 - 5.6 % 6.0(H) - - - -   PHART 7.350 - 7.450 - - 7.366 - -   PCO2ART 32.0 - 48.0 mmHg - - 38.3 - -   HCO3 20.0 - 28.0 mmol/L - 23.7 21.9 23.3 -   TCO2 0 - 100 mmol/L - _0 -   ACIDBASEDEF 0.0 - 2.0 mmol/L - 1.0 3.0(H) 2.0 -   O2SAT % - 70.0 94.0 75.0 -      Capillary Blood Glucose: Lab Results  Component Value Date   GLUCAP 87 08/01/2013   GLUCAP 81 08/01/2013   GLUCAP 156 (H) 09/11/2010     Pulmonary Assessment Scores: Pulmonary Assessment Scores    Row Name 09/10/18 1200 09/13/18 1536       ADL UCSD   ADL Phase  Entry  -    SOB Score total  78  -      CAT Score   CAT Score  pre 26  -      mMRC Score   mMRC Score  -  3       Pulmonary Function Assessment: Pulmonary Function Assessment - 09/10/18 1158      Breath   Bilateral Breath Sounds  Clear;Rales   cracklies in bases   Shortness of Breath  Yes;Limiting activity;Panic with Shortness of Breath;Fear of Shortness of Breath       Exercise Target Goals: Exercise Program Goal: Individual exercise prescription set using results from initial 6 min walk test and THRR while considering  patient's activity barriers and safety.   Exercise Prescription Goal: Initial exercise prescription builds to 30-45 minutes a day of aerobic activity, 2-3 days per week.  Home exercise guidelines will be given to patient during program as part of exercise prescription that the participant will acknowledge.  Activity  Barriers & Risk Stratification: Activity Barriers & Cardiac Risk Stratification - 09/10/18 1152      Activity Barriers & Cardiac Risk Stratification  Activity Barriers  Arthritis;Joint Problems;Deconditioning   left foot multiple surgeries, rheumatoid arthritis      6 Minute Walk: 6 Minute Walk    Row Name 09/13/18 1537         6 Minute Walk   Phase  Initial     Distance  663 feet     Walk Time  5.25 minutes     # of Rest Breaks  1     MPH  1.26     METS  2     RPE  12     Perceived Dyspnea   2     VO2 Peak  4.28     Symptoms  Yes (comment)     Comments  used wheelchair. One seated break for 45 seconds. Left foot pain 2/10     Resting HR  81 bpm     Resting BP  110/72     Resting Oxygen Saturation   95 %     Exercise Oxygen Saturation  during 6 min walk  89 %     Max Ex. HR  115 bpm     Max Ex. BP  142/72     2 Minute Post BP  116/68       Interval HR   1 Minute HR  100     2 Minute HR  110     3 Minute HR  115     4 Minute HR  109     5 Minute HR  108     6 Minute HR  113     2 Minute Post HR  88     Interval Heart Rate?  Yes       Interval Oxygen   Interval Oxygen?  Yes     Baseline Oxygen Saturation %  95 %     1 Minute Oxygen Saturation %  91 %     1 Minute Liters of Oxygen  2 L     2 Minute Oxygen Saturation %  89 %     2 Minute Liters of Oxygen  2 L     3 Minute Oxygen Saturation %  89 %     3 Minute Liters of Oxygen  2 L     4 Minute Oxygen Saturation %  90 %     4 Minute Liters of Oxygen  2 L     5 Minute Oxygen Saturation %  92 %     5 Minute Liters of Oxygen  2 L     6 Minute Oxygen Saturation %  89 %     6 Minute Liters of Oxygen  2 L     2 Minute Post Oxygen Saturation %  95 %     2 Minute Post Liters of Oxygen  2 L        Oxygen Initial Assessment: Oxygen Initial Assessment - 09/13/18 1535      Home Oxygen   Home Oxygen Device  Home Concentrator;E-Tanks    Sleep Oxygen Prescription  Continuous;CPAP    Liters per minute  2    Home  Exercise Oxygen Prescription  Continuous    Liters per minute  2    Home at Rest Exercise Oxygen Prescription  Continuous    Liters per minute  2    Compliance with Home Oxygen Use  Yes      Intervention   Short Term Goals  To learn and exhibit compliance with exercise, home and travel  O2 prescription;To learn and understand importance of monitoring SPO2 with pulse oximeter and demonstrate accurate use of the pulse oximeter.;To learn and understand importance of maintaining oxygen saturations>88%;To learn and demonstrate proper pursed lip breathing techniques or other breathing techniques.;To learn and demonstrate proper use of respiratory medications    Long  Term Goals  Exhibits compliance with exercise, home and travel O2 prescription;Verbalizes importance of monitoring SPO2 with pulse oximeter and return demonstration;Maintenance of O2 saturations>88%;Compliance with respiratory medication;Exhibits proper breathing techniques, such as pursed lip breathing or other method taught during program session;Demonstrates proper use of MDI's       Oxygen Re-Evaluation:   Oxygen Discharge (Final Oxygen Re-Evaluation):   Initial Exercise Prescription: Initial Exercise Prescription - 09/13/18 1500      Date of Initial Exercise RX and Referring Provider   Date  09/13/18    Referring Provider  Dr. Annamaria Boots      Oxygen   Oxygen  Continuous    Liters  2      NuStep   Level  2    SPM  80    Minutes  17      Arm Ergometer   Level  1    Minutes  17      Track   Laps  6    Minutes  17      Prescription Details   Frequency (times per week)  2    Duration  Progress to 45 minutes of aerobic exercise without signs/symptoms of physical distress      Intensity   THRR 40-80% of Max Heartrate  58-117    Ratings of Perceived Exertion  11-13    Perceived Dyspnea  0-4      Progression   Progression  Continue to progress workloads to maintain intensity without signs/symptoms of physical  distress.      Resistance Training   Training Prescription  Yes    Weight  orange bands    Reps  10-15       Perform Capillary Blood Glucose checks as needed.  Exercise Prescription Changes:   Exercise Comments:   Exercise Goals and Review:   Exercise Goals Re-Evaluation :   Discharge Exercise Prescription (Final Exercise Prescription Changes):   Nutrition:  Target Goals: Understanding of nutrition guidelines, daily intake of sodium <1564m, cholesterol <2066m calories 30% from fat and 7% or less from saturated fats, daily to have 5 or more servings of fruits and vegetables.  Biometrics: Pre Biometrics - 09/10/18 1154      Pre Biometrics   Grip Strength  20 kg        Nutrition Therapy Plan and Nutrition Goals:   Nutrition Assessments:   Nutrition Goals Re-Evaluation:   Nutrition Goals Discharge (Final Nutrition Goals Re-Evaluation):   Psychosocial: Target Goals: Acknowledge presence or absence of significant depression and/or stress, maximize coping skills, provide positive support system. Participant is able to verbalize types and ability to use techniques and skills needed for reducing stress and depression.  Initial Review & Psychosocial Screening: Initial Psych Review & Screening - 09/10/18 1208      Initial Review   Current issues with  Current Stress Concerns;Current Depression;History of Depression;Current Psychotropic Meds;Current Sleep Concerns    Source of Stress Concerns  Chronic Illness;Unable to participate in former interests or hobbies;Unable to perform yard/household activities    Comments  Being treated for depression d/t chronic illness and unable to participate in activities      [place]  Yes      Barriers   Psychosocial barriers to participate in program  Psychosocial barriers identified (see note)   SOB and rheumatoid arthritis are barriers     Screening Interventions   Interventions  Encouraged  to exercise    Expected Outcomes  Short Term goal: Utilizing psychosocial counselor, staff and physician to assist with identification of specific Stressors or current issues interfering with healing process. Setting desired goal for each stressor or current issue identified.;Long Term Goal: Stressors or current issues are controlled or eliminated.;Short Term goal: Identification and review with participant of any Quality of Life or Depression concerns found by scoring the questionnaire.;Long Term goal: The participant improves quality of Life and PHQ9 Scores as seen by post scores and/or verbalization of changes       Quality of Life Scores:  Scores of 19 and below usually indicate a poorer quality of life in these areas.  A difference of  2-3 points is a clinically meaningful difference.  A difference of 2-3 points in the total score of the Quality of Life Index has been associated with significant improvement in overall quality of life, self-image, physical symptoms, and general health in studies assessing change in quality of life.   PHQ-9: Recent Review Flowsheet Data    Depression screen [facility] 2/9 09/10/2018 05/09/2018   Decreased Interest 0 0   Down, Depressed, Hopeless 1 0   PHQ - 2 Score 1 0   Altered sleeping 3 -   Tired, decreased energy 3 -   Change in appetite 0 -   Feeling bad or failure about yourself  1 -   Trouble concentrating 0 -   Moving slowly or fidgety/restless 0 -   Suicidal thoughts 0 -   Difficult doing work/chores Somewhat difficult -     Interpretation of Total Score  Total Score Depression Severity:  1-4 = Minimal depression, 5-9 = Mild depression, 10-14 = Moderate depression, 15-19 = Moderately severe depression, 20-27 = Severe depression   Psychosocial Evaluation and Intervention: Psychosocial Evaluation - 09/10/18 1213      Psychosocial Evaluation & Interventions   Interventions  Stress management education;Relaxation education;Encouraged to exercise  with the program and follow exercise prescription    Comments  wants to improve her QOL with exercise    Expected Outcomes  able to handle her stress in healthy manners    Continue Psychosocial Services   Follow up required by staff       Psychosocial Re-Evaluation:   Psychosocial Discharge (Final Psychosocial Re-Evaluation):    Education: Education Goals: Education classes will be provided on a weekly basis, covering required topics. Participant will state understanding/return demonstration of topics presented.  Learning Barriers/Preferences: Learning Barriers/Preferences - 09/10/18 1215      Learning Barriers/Preferences   Learning Barriers  None    Learning Preferences  Computer/Internet;Video;Written Material;Verbal Instruction;Skilled Demonstration;Pictoral;Individual Instruction;Group Instruction       Education Topics: How Lungs Work and Diseases: - Discuss the anatomy of the lungs and diseases that can affect the lungs, such as COPD.   Exercise: -Discuss the importance of exercise, FITT principles of exercise, normal and abnormal responses to exercise, and how to exercise safely.   Environmental Irritants: -Discuss types of environmental irritants and how to limit exposure to environmental irritants.   Meds/Inhalers and oxygen: - Discuss respiratory medications, definition of an inhaler and oxygen, and the proper way to use an inhaler and oxygen.   Energy Saving Techniques: - Discuss methods to conserve energy and  decrease shortness of breath when performing activities of daily living.    Bronchial Hygiene / Breathing Techniques: - Discuss breathing mechanics, pursed-lip breathing technique,  proper posture, effective ways to clear airways, and other functional breathing techniques   Cleaning Equipment: - Provides group verbal and written instruction about the health risks of elevated stress, cause of high stress, and healthy ways to reduce  stress.   Nutrition I: Fats: - Discuss the types of cholesterol, what cholesterol does to the body, and how cholesterol levels can be controlled.   Nutrition II: Labels: -Discuss the different components of food labels and how to read food labels.   Respiratory Infections: - Discuss the signs and symptoms of respiratory infections, ways to prevent respiratory infections, and the importance of seeking medical treatment when having a respiratory infection.   Stress I: Signs and Symptoms: - Discuss the causes of stress, how stress may lead to anxiety and depression, and ways to limit stress.   Stress II: Relaxation: -Discuss relaxation techniques to limit stress.   Oxygen for Home/Travel: - Discuss how to prepare for travel when on oxygen and proper ways to transport and store oxygen to ensure safety.   Knowledge Questionnaire Score: Knowledge Questionnaire Score - 09/10/18 1208      Knowledge Questionnaire Score   Pre Score  12/18       Core Components/Risk Factors/Patient Goals at Admission: Personal Goals and Risk Factors at Admission - 09/10/18 1216      Core Components/Risk Factors/Patient Goals on Admission    Weight Management  Obesity;Weight Loss    Improve shortness of breath with ADL's  Yes    Intervention  Provide education, individualized exercise plan and daily activity instruction to help decrease symptoms of SOB with activities of daily living.    Expected Outcomes  Short Term: Improve cardiorespiratory fitness to achieve a reduction of symptoms when performing ADLs;Long Term: Be able to perform more ADLs without symptoms or delay the onset of symptoms    Stress  Yes    Intervention  Offer individual and/or small group education and counseling on adjustment to heart disease, stress management and health-related lifestyle change. Teach and support self-help strategies.;Refer participants experiencing significant psychosocial distress to appropriate mental health  specialists for further evaluation and treatment. When possible, include family members and significant others in education/counseling sessions.    Expected Outcomes  Short Term: Participant demonstrates changes in health-related behavior, relaxation and other stress management skills, ability to obtain effective social support, and compliance with psychotropic medications if prescribed.;Long Term: Emotional wellbeing is indicated by absence of clinically significant psychosocial distress or social isolation.       Core Components/Risk Factors/Patient Goals Review:  Goals and Risk Factor Review    Row Name 09/10/18 1217             Core Components/Risk Factors/Patient Goals Review   Personal Goals Review  Weight Management/Obesity;Develop more efficient breathing techniques such as purse lipped breathing and diaphragmatic breathing and practicing self-pacing with activity.;Stress;Increase knowledge of respiratory medications and ability to use respiratory devices properly.;Improve shortness of breath with ADL's          Core Components/Risk Factors/Patient Goals at Discharge (Final Review):  Goals and Risk Factor Review - 09/10/18 1217      Core Components/Risk Factors/Patient Goals Review   Personal Goals Review  Weight Management/Obesity;Develop more efficient breathing techniques such as purse lipped breathing and diaphragmatic breathing and practicing self-pacing with activity.;Stress;Increase knowledge of respiratory medications and ability to use  respiratory devices properly.;Improve shortness of breath with ADL's       ITP Comments:   Comments:

## 2018-09-13 NOTE — Telephone Encounter (Signed)
Please advise if ok to refill pt's potassium. Pt hasn't been seen since 06/2017. Pt has upcoming appointment 09/2018.

## 2018-09-13 NOTE — Telephone Encounter (Signed)
I have sent a staff message to [facility] waiting for response

## 2018-09-13 NOTE — Telephone Encounter (Signed)
I have cancelled pt's appt that was originally scheduled with Dr. Annamaria Boots as she does not need that and is to follow up with MR.  Attempted to call pt to let her know this but unable to reach her. Left a detailed message on pt's machine letting her know that the appt that was originally scheduled with  Dr.  Annamaria Boots has been cancelled.

## 2018-09-14 ENCOUNTER — Telehealth: Payer: Self-pay | Admitting: Internal Medicine

## 2018-09-14 DIAGNOSIS — J849 Interstitial pulmonary disease, unspecified: Secondary | ICD-10-CM

## 2018-09-14 DIAGNOSIS — I272 Pulmonary hypertension, unspecified: Secondary | ICD-10-CM

## 2018-09-14 NOTE — Telephone Encounter (Signed)
lmtcb x1 pt 

## 2018-09-17 NOTE — Telephone Encounter (Signed)
Order placed to [facility] to change patient to a home fill system LMOM TCB to inform patient (no dpr on file to leave detailed message on number provided in message)

## 2018-09-17 NOTE — Telephone Encounter (Signed)
Patient returned call - advised of order already sent to [facility] Nothing further needed; will sign off

## 2018-09-17 NOTE — Telephone Encounter (Signed)
I am fine signing this order.  Patient needs oxygen at home.  Wyn Quaker, FNP

## 2018-09-17 NOTE — Telephone Encounter (Signed)
Called and spoke with Patient. Patient stated that she requested a concentrator from [facility], where she could fill her own oxygen tanks at home, herself.  [facility] told her that she needed a order from provider.  Message routed to Wyn Quaker, NP to advise

## 2018-09-18 NOTE — Progress Notes (Signed)
[name] 73 y.o. female  DOB: April 28, 1946 MRN: [id number]           Nutrition Note 1. Interstitial lung disease ([place])    Past Medical History:  Diagnosis Date  . Acute asthmatic bronchitis   . Allergic rhinitis   . Esophageal reflux   . Irritable bowel syndrome    LACTOSE INTOLERANCE   . Rheumatoid arthritis(714.0)    Meds reviewed.   Current Outpatient Medications (Endocrine & Metabolic):  .  predniSONE (DELTASONE) 10 MG tablet, Take as instructed on AVS.  Current Outpatient Medications (Cardiovascular):  .  colestipol (COLESTID) 1 G tablet, Take 1 g by mouth 2 (two) times daily.  .  furosemide (LASIX) 40 MG tablet, TAKE 1 TABLET BY MOUTH TWO  TIMES DAILY .  rosuvastatin (CRESTOR) 20 MG tablet, TAKE 1 TABLET BY MOUTH  DAILY .  verapamil (CALAN-SR) 240 MG CR tablet, Take 240 mg by mouth daily.  Current Outpatient Medications (Respiratory):  Marland Kitchen  ANORO ELLIPTA 62.5-25 MCG/INH AEPB, USE 1 INHALATION DAILY .  benzonatate (TESSALON) 200 MG capsule, TAKE 1 CAPSULE BY MOUTH 3 TIMES A DAY AS NEEDED FOR COUGH .  cetirizine (ZYRTEC) 10 MG tablet, Take 10 mg by mouth daily. .  fluticasone (FLONASE) 50 MCG/ACT nasal spray, Place 2 sprays into the nose daily.  Marland Kitchen  ipratropium (ATROVENT) 0.03 % nasal spray, USE 1 TO 2 SPRAYS INTO BOTH NOSTRILS 2 TIMES DAILY. Marland Kitchen  ipratropium-albuterol (DUONEB) 0.5-2.5 (3) MG/3ML SOLN, Take 3 mLs by nebulization every 6 (six) hours as needed. .  montelukast (SINGULAIR) 10 MG tablet, TAKE 1 TABLET BY MOUTH AT  BEDTIME .  PROAIR HFA 108 (90 Base) MCG/ACT inhaler, USE 2 PUFFS BY MOUTH EVERY  6 HOURS AS NEEDED  Current Outpatient Medications (Analgesics):  .  aspirin EC 81 MG tablet, Take 81 mg by mouth daily. Marland Kitchen  ibuprofen (ADVIL,MOTRIN) 400 MG tablet, Take 400 mg by mouth as needed. .  leflunomide (ARAVA) 20 MG tablet, Take 20 mg by mouth daily.  .  traMADol (ULTRAM) 50 MG tablet, Take 50 mg by mouth 2 (two) times daily.    Current Outpatient Medications  (Other):  Marland Kitchen  ALPRAZolam (XANAX) 0.5 MG tablet, Take 0.5 mg by mouth as needed. .  clidinium-chlordiazePOXIDE (LIBRAX) 5-2.5 MG capsule, Take 1 capsule by mouth 2 (two) times daily as needed (for IBS). .  [employer] D3 5000 units capsule, Take 5,000 Units by mouth daily. Marland Kitchen  FLUoxetine (PROZAC) 20 MG capsule, Take 20 mg by mouth daily. Marland Kitchen  glycopyrrolate (ROBINUL) 1 MG tablet, Take 1 mg by mouth 3 (three) times daily as needed (for IBS).  .  hydroxychloroquine (PLAQUENIL) 200 MG tablet, Take 600 mg by mouth daily.  .  hydroxypropyl methylcellulose / hypromellose (ISOPTO TEARS / GONIOVISC) 2.5 % ophthalmic solution, Place 1-2 drops into both eyes 3 (three) times daily as needed for dry eyes. .  mirtazapine (REMERON) 45 MG tablet, Take 45 mg by mouth at bedtime. .  Multiple Vitamin (MULTIVITAMIN) tablet, Take 1 tablet by mouth daily. Marland Kitchen  omeprazole (PRILOSEC) 40 MG capsule, Take 40 mg by mouth daily. Marland Kitchen  Spacer/Aero-Holding Chambers (AEROCHAMBER MV) inhaler, Use as instructed .  amoxicillin-clavulanate (AUGMENTIN) 875-125 MG tablet, Take 1 tablet by mouth 2 (two) times daily. (Patient not taking: Reported on 09/10/2018) .  potassium chloride SA (K-DUR,KLOR-CON) 20 MEQ tablet, TAKE 1 TABLET BY MOUTH 3  TIMES DAILY   Ht: Ht Readings from Last 1 Encounters:  09/10/18 _0  (1.549 m)  Wt:  Wt Readings from Last 6 Encounters:  09/10/18 196 lb 3.4 oz (89 kg)  08/29/18 199 lb 12.8 oz (90.6 kg)  07/31/18 205 lb (93 kg)  07/02/18 203 lb 6.4 oz (92.3 kg)  06/08/18 202 lb (91.6 kg)  05/21/18 202 lb 12.8 oz (92 kg)     BMI: Body mass index is 37.07 kg/m.    Current tobacco use? No    Labs:  Lipid Panel     Component Value Date/Time   CHOL 127 01/09/2017 1024   TRIG 181 (H) 01/09/2017 1024   HDL 53 01/09/2017 1024   CHOLHDL 2.4 01/09/2017 1024   LDLCALC 38 01/09/2017 1024   LDLDIRECT 53 01/09/2017 1024    Lab Results  Component Value Date   HGBA1C 6.0 (H) 11/15/2012    Nutrition  Diagnosis ? Food-and nutrition-related knowledge deficit related to lack of exposure to information as related to diagnosis of: pulmonary disease, CAD, prediabetes ? Overweight/obesity related to excessive energy intake as evidenced by a BMI of Body mass index is 37.07 kg/m.    Goal(s) 1. Pt to identify and limit food sources of sodium, saturated fat, trans fat, and refined carbohydrates 2. Identify food quantities necessary to achieve wt loss of  -2# per week to a goal wt loss of 2.7-10.9 kg (6-24 lb) at graduation from pulmonary rehab 3. Pt able to name foods that affect blood glucose   Plan:  Pt to attend Pulmonary Nutrition class Will provide client-centered nutrition education as part of interdisciplinary care.    Monitor and Evaluate progress toward nutrition goal with team.   Laurina Bustle, MS, RD, LDN 09/18/2018 7:41 AM

## 2018-09-19 ENCOUNTER — Telehealth (HOSPITAL_COMMUNITY): Payer: Self-pay | Admitting: Family Medicine

## 2018-09-19 ENCOUNTER — Other Ambulatory Visit: Payer: Self-pay | Admitting: Pulmonary Disease

## 2018-09-19 DIAGNOSIS — J441 Chronic obstructive pulmonary disease with (acute) exacerbation: Secondary | ICD-10-CM

## 2018-09-20 ENCOUNTER — Encounter (HOSPITAL_COMMUNITY): Payer: Self-pay

## 2018-09-20 ENCOUNTER — Ambulatory Visit (HOSPITAL_COMMUNITY): Payer: Medicare Other

## 2018-09-20 NOTE — Progress Notes (Signed)
Pulmonary Individual Treatment Plan  Patient Details  Name: [name] MRN: [id number] Date of Birth: 04-01-1946 Referring Provider:     Pulmonary Rehab Walk Test from 09/13/2018 in [place]  Referring Provider  Dr. Annamaria Boots      Initial Encounter Date:    Pulmonary Rehab Walk Test from 09/13/2018 in [place]  Date  09/13/18      Visit Diagnosis: Interstitial lung disease ([place])  Patient's Home Medications on Admission:   Current Outpatient Medications:  .  ALPRAZolam (XANAX) 0.5 MG tablet, Take 0.5 mg by mouth as needed., Disp: , Rfl:  .  amoxicillin-clavulanate (AUGMENTIN) 875-125 MG tablet, Take 1 tablet by mouth 2 (two) times daily. (Patient not taking: Reported on 09/10/2018), Disp: 14 tablet, Rfl: 0 .  ANORO ELLIPTA 62.5-25 MCG/INH AEPB, USE 1 INHALATION DAILY, Disp: 180 each, Rfl: 2 .  aspirin EC 81 MG tablet, Take 81 mg by mouth daily., Disp: , Rfl:  .  benzonatate (TESSALON) 200 MG capsule, TAKE 1 CAPSULE BY MOUTH 3 TIMES A DAY AS NEEDED FOR COUGH, Disp: 30 capsule, Rfl: 3 .  cetirizine (ZYRTEC) 10 MG tablet, Take 10 mg by mouth daily., Disp: , Rfl:  .  clidinium-chlordiazePOXIDE (LIBRAX) 5-2.5 MG capsule, Take 1 capsule by mouth 2 (two) times daily as needed (for IBS)., Disp: , Rfl:  .  colestipol (COLESTID) 1 G tablet, Take 1 g by mouth 2 (two) times daily. , Disp: , Rfl:  .  [employer] D3 5000 units capsule, Take 5,000 Units by mouth daily., Disp: , Rfl: 2 .  FLUoxetine (PROZAC) 20 MG capsule, Take 20 mg by mouth daily., Disp: , Rfl:  .  fluticasone (FLONASE) 50 MCG/ACT nasal spray, Place 2 sprays into the nose daily. , Disp: , Rfl:  .  furosemide (LASIX) 40 MG tablet, TAKE 1 TABLET BY MOUTH TWO  TIMES DAILY, Disp: 180 tablet, Rfl: 1 .  glycopyrrolate (ROBINUL) 1 MG tablet, Take 1 mg by mouth 3 (three) times daily as needed (for IBS). , Disp: , Rfl:  .  hydroxychloroquine (PLAQUENIL) 200 MG tablet, Take 600 mg by  mouth daily. , Disp: , Rfl:  .  hydroxypropyl methylcellulose / hypromellose (ISOPTO TEARS / GONIOVISC) 2.5 % ophthalmic solution, Place 1-2 drops into both eyes 3 (three) times daily as needed for dry eyes., Disp: , Rfl:  .  ibuprofen (ADVIL,MOTRIN) 400 MG tablet, Take 400 mg by mouth as needed., Disp: , Rfl:  .  ipratropium (ATROVENT) 0.03 % nasal spray, USE 1 TO 2 SPRAYS INTO BOTH NOSTRILS 2 TIMES DAILY., Disp: 90 mL, Rfl: 1 .  ipratropium-albuterol (DUONEB) 0.5-2.5 (3) MG/3ML SOLN, Take 3 mLs by nebulization every 6 (six) hours as needed., Disp: 75 mL, Rfl: 12 .  leflunomide (ARAVA) 20 MG tablet, Take 20 mg by mouth daily. , Disp: , Rfl:  .  mirtazapine (REMERON) 45 MG tablet, Take 45 mg by mouth at bedtime., Disp: , Rfl:  .  montelukast (SINGULAIR) 10 MG tablet, TAKE 1 TABLET BY MOUTH AT  BEDTIME, Disp: 90 tablet, Rfl: 1 .  Multiple Vitamin (MULTIVITAMIN) tablet, Take 1 tablet by mouth daily., Disp: , Rfl:  .  omeprazole (PRILOSEC) 40 MG capsule, Take 40 mg by mouth daily., Disp: , Rfl:  .  potassium chloride SA (K-DUR,KLOR-CON) 20 MEQ tablet, TAKE 1 TABLET BY MOUTH 3  TIMES DAILY, Disp: 270 tablet, Rfl: 0 .  predniSONE (DELTASONE) 10 MG tablet, Take as instructed on  AVS., Disp: 60 tablet, Rfl: 0 .  PROAIR HFA 108 (90 Base) MCG/ACT inhaler, USE 2 PUFFS BY MOUTH EVERY  6 HOURS AS NEEDED, Disp: 25.5 g, Rfl: 3 .  rosuvastatin (CRESTOR) 20 MG tablet, TAKE 1 TABLET BY MOUTH  DAILY, Disp: 90 tablet, Rfl: 0 .  Spacer/Aero-Holding Chambers (AEROCHAMBER MV) inhaler, Use as instructed, Disp: 1 each, Rfl: 0 .  traMADol (ULTRAM) 50 MG tablet, Take 50 mg by mouth 2 (two) times daily. , Disp: , Rfl:  .  verapamil (CALAN-SR) 240 MG CR tablet, Take 240 mg by mouth daily., Disp: , Rfl:   Past Medical History: Past Medical History:  Diagnosis Date  . Acute asthmatic bronchitis   . Allergic rhinitis   . Esophageal reflux   . Irritable bowel syndrome   . Rheumatoid arthritis(714.0)     Tobacco  Use: Social History   Tobacco Use  Smoking Status Never Smoker  Smokeless Tobacco Never Used  Tobacco Comment   positive passive tobacco smoke exposure    Labs: Recent Review Flowsheet Data    Labs for ITP Cardiac and Pulmonary Rehab Latest Ref Rng & Units 11/15/2012 09/16/2016 09/16/2016 09/16/2016 01/09/2017   Cholestrol 100 - 199 mg/dL - - - - 127   LDLCALC 0 - 99 mg/dL - - - - 38   LDLDIRECT 0 - 99 mg/dL - - - - 53   HDL >39 mg/dL - - - - 53   Trlycerides 0 - 149 mg/dL - - - - 181(H)   Hemoglobin A1c 4.8 - 5.6 % 6.0(H) - - - -   PHART 7.350 - 7.450 - - 7.366 - -   PCO2ART 32.0 - 48.0 mmHg - - 38.3 - -   HCO3 20.0 - 28.0 mmol/L - 23.7 21.9 23.3 -   TCO2 0 - 100 mmol/L - _0 -   ACIDBASEDEF 0.0 - 2.0 mmol/L - 1.0 3.0(H) 2.0 -   O2SAT % - 70.0 94.0 75.0 -      Capillary Blood Glucose: Lab Results  Component Value Date   GLUCAP 87 08/01/2013   GLUCAP 81 08/01/2013   GLUCAP 156 (H) 09/11/2010     Pulmonary Assessment Scores: Pulmonary Assessment Scores    Row Name 09/10/18 1200 09/13/18 1536       ADL UCSD   ADL Phase  Entry  -    SOB Score total  78  -      CAT Score   CAT Score  pre 26  -      mMRC Score   mMRC Score  -  3       Pulmonary Function Assessment: Pulmonary Function Assessment - 09/10/18 1158      Breath   Bilateral Breath Sounds  Clear;Rales   cracklies in bases   Shortness of Breath  Yes;Limiting activity;Panic with Shortness of Breath;Fear of Shortness of Breath       Exercise Target Goals: Exercise Program Goal: Individual exercise prescription set using results from initial 6 min walk test and THRR while considering  patient's activity barriers and safety.   Exercise Prescription Goal: Initial exercise prescription builds to 30-45 minutes a day of aerobic activity, 2-3 days per week.  Home exercise guidelines will be given to patient during program as part of exercise prescription that the participant will acknowledge.  Activity  Barriers & Risk Stratification: Activity Barriers & Cardiac Risk Stratification - 09/10/18 1152      Activity Barriers & Cardiac Risk Stratification  Activity Barriers  Arthritis;Joint Problems;Deconditioning   left foot multiple surgeries, rheumatoid arthritis      6 Minute Walk: 6 Minute Walk    Row Name 09/13/18 1537         6 Minute Walk   Phase  Initial     Distance  663 feet     Walk Time  5.25 minutes     # of Rest Breaks  1     MPH  1.26     METS  2     RPE  12     Perceived Dyspnea   2     VO2 Peak  4.28     Symptoms  Yes (comment)     Comments  used wheelchair. One seated break for 45 seconds. Left foot pain 2/10     Resting HR  81 bpm     Resting BP  110/72     Resting Oxygen Saturation   95 %     Exercise Oxygen Saturation  during 6 min walk  89 %     Max Ex. HR  115 bpm     Max Ex. BP  142/72     2 Minute Post BP  116/68       Interval HR   1 Minute HR  100     2 Minute HR  110     3 Minute HR  115     4 Minute HR  109     5 Minute HR  108     6 Minute HR  113     2 Minute Post HR  88     Interval Heart Rate?  Yes       Interval Oxygen   Interval Oxygen?  Yes     Baseline Oxygen Saturation %  95 %     1 Minute Oxygen Saturation %  91 %     1 Minute Liters of Oxygen  2 L     2 Minute Oxygen Saturation %  89 %     2 Minute Liters of Oxygen  2 L     3 Minute Oxygen Saturation %  89 %     3 Minute Liters of Oxygen  2 L     4 Minute Oxygen Saturation %  90 %     4 Minute Liters of Oxygen  2 L     5 Minute Oxygen Saturation %  92 %     5 Minute Liters of Oxygen  2 L     6 Minute Oxygen Saturation %  89 %     6 Minute Liters of Oxygen  2 L     2 Minute Post Oxygen Saturation %  95 %     2 Minute Post Liters of Oxygen  2 L        Oxygen Initial Assessment: Oxygen Initial Assessment - 09/13/18 1535      Home Oxygen   Home Oxygen Device  Home Concentrator;E-Tanks    Sleep Oxygen Prescription  Continuous;CPAP    Liters per minute  2    Home  Exercise Oxygen Prescription  Continuous    Liters per minute  2    Home at Rest Exercise Oxygen Prescription  Continuous    Liters per minute  2    Compliance with Home Oxygen Use  Yes      Intervention   Short Term Goals  To learn and exhibit compliance with exercise, home and travel  O2 prescription;To learn and understand importance of monitoring SPO2 with pulse oximeter and demonstrate accurate use of the pulse oximeter.;To learn and understand importance of maintaining oxygen saturations>88%;To learn and demonstrate proper pursed lip breathing techniques or other breathing techniques.;To learn and demonstrate proper use of respiratory medications    Long  Term Goals  Exhibits compliance with exercise, home and travel O2 prescription;Verbalizes importance of monitoring SPO2 with pulse oximeter and return demonstration;Maintenance of O2 saturations>88%;Compliance with respiratory medication;Exhibits proper breathing techniques, such as pursed lip breathing or other method taught during program session;Demonstrates proper use of MDI's       Oxygen Re-Evaluation: Oxygen Re-Evaluation    Row Name 09/18/18 0833             Program Oxygen Prescription   Program Oxygen Prescription  Continuous       Liters per minute  2         Home Oxygen   Home Oxygen Device  Home Concentrator;E-Tanks       Sleep Oxygen Prescription  Continuous;CPAP       Liters per minute  2       Home Exercise Oxygen Prescription  Continuous       Liters per minute  2       Home at Rest Exercise Oxygen Prescription  Continuous       Liters per minute  2       Compliance with Home Oxygen Use  Yes         Goals/Expected Outcomes   Short Term Goals  To learn and exhibit compliance with exercise, home and travel O2 prescription;To learn and understand importance of monitoring SPO2 with pulse oximeter and demonstrate accurate use of the pulse oximeter.;To learn and understand importance of maintaining oxygen  saturations>88%;To learn and demonstrate proper pursed lip breathing techniques or other breathing techniques.;To learn and demonstrate proper use of respiratory medications       Long  Term Goals  Exhibits compliance with exercise, home and travel O2 prescription;Verbalizes importance of monitoring SPO2 with pulse oximeter and return demonstration;Maintenance of O2 saturations>88%;Compliance with respiratory medication;Exhibits proper breathing techniques, such as pursed lip breathing or other method taught during program session;Demonstrates proper use of MDI's       Goals/Expected Outcomes  compliance           Oxygen Discharge (Final Oxygen Re-Evaluation): Oxygen Re-Evaluation - 09/18/18 0833      Program Oxygen Prescription   Program Oxygen Prescription  Continuous    Liters per minute  2      Home Oxygen   Home Oxygen Device  Home Concentrator;E-Tanks    Sleep Oxygen Prescription  Continuous;CPAP    Liters per minute  2    Home Exercise Oxygen Prescription  Continuous    Liters per minute  2    Home at Rest Exercise Oxygen Prescription  Continuous    Liters per minute  2    Compliance with Home Oxygen Use  Yes      Goals/Expected Outcomes   Short Term Goals  To learn and exhibit compliance with exercise, home and travel O2 prescription;To learn and understand importance of monitoring SPO2 with pulse oximeter and demonstrate accurate use of the pulse oximeter.;To learn and understand importance of maintaining oxygen saturations>88%;To learn and demonstrate proper pursed lip breathing techniques or other breathing techniques.;To learn and demonstrate proper use of respiratory medications    Long  Term Goals  Exhibits compliance with exercise, home and travel O2 prescription;Verbalizes importance  of monitoring SPO2 with pulse oximeter and return demonstration;Maintenance of O2 saturations>88%;Compliance with respiratory medication;Exhibits proper breathing techniques, such as pursed  lip breathing or other method taught during program session;Demonstrates proper use of MDI's    Goals/Expected Outcomes  compliance        Initial Exercise Prescription: Initial Exercise Prescription - 09/13/18 1500      Date of Initial Exercise RX and Referring Provider   Date  09/13/18    Referring Provider  Dr. Annamaria Boots      Oxygen   Oxygen  Continuous    Liters  2      NuStep   Level  2    SPM  80    Minutes  17      Arm Ergometer   Level  1    Minutes  17      Track   Laps  6    Minutes  17      Prescription Details   Frequency (times per week)  2    Duration  Progress to 45 minutes of aerobic exercise without signs/symptoms of physical distress      Intensity   THRR 40-80% of Max Heartrate  58-117    Ratings of Perceived Exertion  11-13    Perceived Dyspnea  0-4      Progression   Progression  Continue to progress workloads to maintain intensity without signs/symptoms of physical distress.      Resistance Training   Training Prescription  Yes    Weight  orange bands    Reps  10-15       Perform Capillary Blood Glucose checks as needed.  Exercise Prescription Changes:   Exercise Comments:   Exercise Goals and Review:   Exercise Goals Re-Evaluation : Exercise Goals Re-Evaluation    Row Name 09/18/18 0837             Exercise Goal Re-Evaluation   Exercise Goals Review  Increase Physical Activity;Increase Strength and Stamina;Able to understand and use rate of perceived exertion (RPE) scale;Able to understand and use Dyspnea scale;Knowledge and understanding of Target Heart Rate Range (THRR);Understanding of Exercise Prescription       Comments  Pt will begin exercise on Thursday. Pt was extremely anxious during her walk test. Will monitor and progress as appropriate through the program when she begins.        Expected Outcomes  Through exercise at rehab and at home, the patient will decrease shortness of breath with daily activities and feel  confident in carrying out an exercise regime at home.           Discharge Exercise Prescription (Final Exercise Prescription Changes):   Nutrition:  Target Goals: Understanding of nutrition guidelines, daily intake of sodium <155m, cholesterol <2039m calories 30% from fat and 7% or less from saturated fats, daily to have 5 or more servings of fruits and vegetables.  Biometrics: Pre Biometrics - 09/10/18 1154      Pre Biometrics   Grip Strength  20 kg        Nutrition Therapy Plan and Nutrition Goals: Nutrition Therapy & Goals - 09/18/18 0753      Nutrition Therapy   Diet  heart healthy, diabetic      Personal Nutrition Goals   Nutrition Goal  Pt to identify and limit food sources of sodium, saturated fat, trans fat, and refined carbohydrates    Personal Goal #2  Identify food quantities necessary to achieve wt loss of  -2# per week  to a goal wt loss of 2.7-10.9 kg (6-24 lb) at graduation from pulmonary rehab.    Personal Goal #3  Pt able to name foods that affect blood glucose       Intervention Plan   Intervention  Prescribe, educate and counsel regarding individualized specific dietary modifications aiming towards targeted core components such as weight, hypertension, lipid management, diabetes, heart failure and other comorbidities.    Expected Outcomes  Short Term Goal: Understand basic principles of dietary content, such as calories, fat, sodium, cholesterol and nutrients.;Long Term Goal: Adherence to prescribed nutrition plan.       Nutrition Assessments: Nutrition Assessments - 09/19/18 0802      Rate Your Plate Scores   Pre Score  48       Nutrition Goals Re-Evaluation: Nutrition Goals Re-Evaluation    Row Name 09/18/18 0753             Goals   Current Weight  196 lb 3.4 oz (89 kg)          Nutrition Goals Discharge (Final Nutrition Goals Re-Evaluation): Nutrition Goals Re-Evaluation - 09/18/18 0753      Goals   Current Weight  196 lb 3.4 oz  (89 kg)       Psychosocial: Target Goals: Acknowledge presence or absence of significant depression and/or stress, maximize coping skills, provide positive support system. Participant is able to verbalize types and ability to use techniques and skills needed for reducing stress and depression.  Initial Review & Psychosocial Screening: Initial Psych Review & Screening - 09/10/18 1208      Initial Review   Current issues with  Current Stress Concerns;Current Depression;History of Depression;Current Psychotropic Meds;Current Sleep Concerns    Source of Stress Concerns  Chronic Illness;Unable to participate in former interests or hobbies;Unable to perform yard/household activities    Comments  Being treated for depression d/t chronic illness and unable to participate in activities      [place]?  Yes      Barriers   Psychosocial barriers to participate in program  Psychosocial barriers identified (see note)   SOB and rheumatoid arthritis are barriers     Screening Interventions   Interventions  Encouraged to exercise    Expected Outcomes  Short Term goal: Utilizing psychosocial counselor, staff and physician to assist with identification of specific Stressors or current issues interfering with healing process. Setting desired goal for each stressor or current issue identified.;Long Term Goal: Stressors or current issues are controlled or eliminated.;Short Term goal: Identification and review with participant of any Quality of Life or Depression concerns found by scoring the questionnaire.;Long Term goal: The participant improves quality of Life and PHQ9 Scores as seen by post scores and/or verbalization of changes       Quality of Life Scores:  Scores of 19 and below usually indicate a poorer quality of life in these areas.  A difference of  2-3 points is a clinically meaningful difference.  A difference of 2-3 points in the total score of the Quality of Life  Index has been associated with significant improvement in overall quality of life, self-image, physical symptoms, and general health in studies assessing change in quality of life.  PHQ-9: Recent Review Flowsheet Data    Depression screen [facility] 2/9 09/10/2018 05/09/2018   Decreased Interest 0 0   Down, Depressed, Hopeless 1 0   PHQ - 2 Score 1 0   Altered sleeping 3 -   Tired, decreased  energy 3 -   Change in appetite 0 -   Feeling bad or failure about yourself  1 -   Trouble concentrating 0 -   Moving slowly or fidgety/restless 0 -   Suicidal thoughts 0 -   Difficult doing work/chores Somewhat difficult -     Interpretation of Total Score  Total Score Depression Severity:  1-4 = Minimal depression, 5-9 = Mild depression, 10-14 = Moderate depression, 15-19 = Moderately severe depression, 20-27 = Severe depression   Psychosocial Evaluation and Intervention: Psychosocial Evaluation - 09/20/18 0837      Psychosocial Evaluation & Interventions   Interventions  Stress management education;Relaxation education;Encouraged to exercise with the program and follow exercise prescription    Comments  wants to improve her QOL with exercise    Expected Outcomes  able to handle her stress in healthy manners    Continue Psychosocial Services   Follow up required by staff       Psychosocial Re-Evaluation: Psychosocial Re-Evaluation    [place] Name 09/20/18 [phone]             Psychosocial Re-Evaluation   Current issues with  Current Abuse or Neglect to Report;Current Depression;History of Depression;Current Sleep Concerns;Current Psychotropic Meds       Comments  Pt called out of exercise on initial start of exercise day 3/5. Will hope to start exrcise on 3/10       Expected Outcomes  Pt will report decrease in psychosocial barriers       Interventions  Stress management education;Relaxation education;Encouraged to attend Pulmonary Rehabilitation for the exercise       Continue Psychosocial  Services   Follow up required by staff          Psychosocial Discharge (Final Psychosocial Re-Evaluation): Psychosocial Re-Evaluation - 09/20/18 0837      Psychosocial Re-Evaluation   Current issues with  Current Abuse or Neglect to Report;Current Depression;History of Depression;Current Sleep Concerns;Current Psychotropic Meds    Comments  Pt called out of exercise on initial start of exercise day 3/5. Will hope to start exrcise on 3/10    Expected Outcomes  Pt will report decrease in psychosocial barriers    Interventions  Stress management education;Relaxation education;Encouraged to attend Pulmonary Rehabilitation for the exercise    Continue Psychosocial Services   Follow up required by staff       Education: Education Goals: Education classes will be provided on a weekly basis, covering required topics. Participant will state understanding/return demonstration of topics presented.  Learning Barriers/Preferences: Learning Barriers/Preferences - 09/10/18 1215      Learning Barriers/Preferences   Learning Barriers  None    Learning Preferences  Computer/Internet;Video;Written Material;Verbal Instruction;Skilled Demonstration;Pictoral;Individual Instruction;Group Instruction       Education Topics: Risk Factor Reduction:  -Group instruction that is supported by a PowerPoint presentation. Instructor discusses the definition of a risk factor, different risk factors for pulmonary disease, and how the heart and lungs work together.     Nutrition for Pulmonary Patient:  -Group instruction provided by PowerPoint slides, verbal discussion, and written materials to support subject matter. The instructor gives an explanation and review of healthy diet recommendations, which includes a discussion on weight management, recommendations for fruit and vegetable consumption, as well as protein, fluid, caffeine, fiber, sodium, sugar, and alcohol. Tips for eating when patients are short of breath  are discussed.   Pursed Lip Breathing:  -Group instruction that is supported by demonstration and informational handouts. Instructor discusses the benefits of pursed lip and  diaphragmatic breathing and detailed demonstration on how to preform both.     Oxygen Safety:  -Group instruction provided by PowerPoint, verbal discussion, and written material to support subject matter. There is an overview of "What is Oxygen" and "Why do we need it".  Instructor also reviews how to create a safe environment for oxygen use, the importance of using oxygen as prescribed, and the risks of noncompliance. There is a brief discussion on traveling with oxygen and resources the patient may utilize.   Oxygen Equipment:  -Group instruction provided by [facility] Staff utilizing handouts, written materials, and equipment demonstrations.   Signs and Symptoms:  -Group instruction provided by written material and verbal discussion to support subject matter. Warning signs and symptoms of infection, stroke, and heart attack are reviewed and when to call the physician/911 reinforced. Tips for preventing the spread of infection discussed.   Advanced Directives:  -Group instruction provided by verbal instruction and written material to support subject matter. Instructor reviews Advanced Directive laws and proper instruction for filling out document.   Pulmonary Video:  -Group video education that reviews the importance of medication and oxygen compliance, exercise, good nutrition, pulmonary hygiene, and pursed lip and diaphragmatic breathing for the pulmonary patient.   Exercise for the Pulmonary Patient:  -Group instruction that is supported by a PowerPoint presentation. Instructor discusses benefits of exercise, core components of exercise, frequency, duration, and intensity of an exercise routine, importance of utilizing pulse oximetry during exercise, safety while exercising, and options of places to exercise  outside of rehab.     Pulmonary Medications:  -Verbally interactive group education provided by instructor with focus on inhaled medications and proper administration.   Anatomy and Physiology of the Respiratory System and Intimacy:  -Group instruction provided by PowerPoint, verbal discussion, and written material to support subject matter. Instructor reviews respiratory cycle and anatomical components of the respiratory system and their functions. Instructor also reviews differences in obstructive and restrictive respiratory diseases with examples of each. Intimacy, Sex, and Sexuality differences are reviewed with a discussion on how relationships can change when diagnosed with pulmonary disease. Common sexual concerns are reviewed.   MD DAY -A group question and answer session with a medical doctor that allows participants to ask questions that relate to their pulmonary disease state.   OTHER EDUCATION -Group or individual verbal, written, or video instructions that support the educational goals of the pulmonary rehab program.   Holiday Eating Survival Tips:  -Group instruction provided by PowerPoint slides, verbal discussion, and written materials to support subject matter. The instructor gives patients tips, tricks, and techniques to help them not only survive but enjoy the holidays despite the onslaught of food that accompanies the holidays.   Knowledge Questionnaire Score: Knowledge Questionnaire Score - 09/10/18 1208      Knowledge Questionnaire Score   Pre Score  12/18       Core Components/Risk Factors/Patient Goals at Admission: Personal Goals and Risk Factors at Admission - 09/10/18 1216      Core Components/Risk Factors/Patient Goals on Admission    Weight Management  Obesity;Weight Loss    Improve shortness of breath with ADL's  Yes    Intervention  Provide education, individualized exercise plan and daily activity instruction to help decrease symptoms of SOB with  activities of daily living.    Expected Outcomes  Short Term: Improve cardiorespiratory fitness to achieve a reduction of symptoms when performing ADLs;Long Term: Be able to perform more ADLs without symptoms or delay the  onset of symptoms    Stress  Yes    Intervention  Offer individual and/or small group education and counseling on adjustment to heart disease, stress management and health-related lifestyle change. Teach and support self-help strategies.;Refer participants experiencing significant psychosocial distress to appropriate mental health specialists for further evaluation and treatment. When possible, include family members and significant others in education/counseling sessions.    Expected Outcomes  Short Term: Participant demonstrates changes in health-related behavior, relaxation and other stress management skills, ability to obtain effective social support, and compliance with psychotropic medications if prescribed.;Long Term: Emotional wellbeing is indicated by absence of clinically significant psychosocial distress or social isolation.       Core Components/Risk Factors/Patient Goals Review:  Goals and Risk Factor Review    Row Name 09/10/18 1217 09/20/18 0840           Core Components/Risk Factors/Patient Goals Review   Personal Goals Review  Weight Management/Obesity;Develop more efficient breathing techniques such as purse lipped breathing and diaphragmatic breathing and practicing self-pacing with activity.;Stress;Increase knowledge of respiratory medications and ability to use respiratory devices properly.;Improve shortness of breath with ADL's  Weight Management/Obesity;Develop more efficient breathing techniques such as purse lipped breathing and diaphragmatic breathing and practicing self-pacing with activity.;Stress;Increase knowledge of respiratory medications and ability to use respiratory devices properly.;Improve shortness of breath with ADL's      Review  -  Pt called  out of initial start date for exercise of 3/5. Will hope to start exercise on 3/10      Expected Outcomes  -  see admission goals and outcomes         Core Components/Risk Factors/Patient Goals at Discharge (Final Review):  Goals and Risk Factor Review - 09/20/18 0840      Core Components/Risk Factors/Patient Goals Review   Personal Goals Review  Weight Management/Obesity;Develop more efficient breathing techniques such as purse lipped breathing and diaphragmatic breathing and practicing self-pacing with activity.;Stress;Increase knowledge of respiratory medications and ability to use respiratory devices properly.;Improve shortness of breath with ADL's    Review  Pt called out of initial start date for exercise of 3/5. Will hope to start exercise on 3/10    Expected Outcomes  see admission goals and outcomes       ITP Comments: ITP Comments    Row Name 09/20/18 0836           ITP Comments  Dr. Jennet Maduro, Medical Director, Pulmonary Rehab          Comments: ITP REVIEW Pt to start exercise on 09/25/18. Recommend continued exercise, life style modification, education, and utilization of breathing techniques to increase stamina and strength and decrease shortness of breath with exertion.  Joycelyn Man RN, BSN Cardiac and Pulmonary Rehab RN

## 2018-09-24 ENCOUNTER — Ambulatory Visit: Payer: Medicare Other | Admitting: Internal Medicine

## 2018-09-24 ENCOUNTER — Telehealth (HOSPITAL_COMMUNITY): Payer: Self-pay

## 2018-09-24 NOTE — Telephone Encounter (Signed)
Pt husband Herbie [place] called and stated pt is having hip problems. Will be out this week.

## 2018-09-25 ENCOUNTER — Ambulatory Visit (HOSPITAL_COMMUNITY): Payer: Medicare Other

## 2018-09-27 ENCOUNTER — Ambulatory Visit (HOSPITAL_COMMUNITY): Payer: Medicare Other

## 2018-09-28 ENCOUNTER — Other Ambulatory Visit: Payer: Self-pay | Admitting: *Deleted

## 2018-09-28 DIAGNOSIS — J4541 Moderate persistent asthma with (acute) exacerbation: Secondary | ICD-10-CM

## 2018-09-28 NOTE — Progress Notes (Signed)
pft

## 2018-10-01 ENCOUNTER — Telehealth (HOSPITAL_COMMUNITY): Payer: Self-pay

## 2018-10-01 ENCOUNTER — Encounter: Payer: Self-pay | Admitting: Pulmonary Disease

## 2018-10-01 ENCOUNTER — Ambulatory Visit (INDEPENDENT_AMBULATORY_CARE_PROVIDER_SITE_OTHER): Payer: Medicare Other | Admitting: Internal Medicine

## 2018-10-01 ENCOUNTER — Ambulatory Visit (INDEPENDENT_AMBULATORY_CARE_PROVIDER_SITE_OTHER): Payer: Medicare Other | Admitting: Pulmonary Disease

## 2018-10-01 ENCOUNTER — Other Ambulatory Visit: Payer: Self-pay

## 2018-10-01 VITALS — BP 136/68 | HR 90 | Temp 98.1°F | Ht 61.0 in | Wt 201.8 lb

## 2018-10-01 DIAGNOSIS — M069 Rheumatoid arthritis, unspecified: Secondary | ICD-10-CM | POA: Diagnosis not present

## 2018-10-01 DIAGNOSIS — I2510 Atherosclerotic heart disease of native coronary artery without angina pectoris: Secondary | ICD-10-CM | POA: Diagnosis not present

## 2018-10-01 DIAGNOSIS — J849 Interstitial pulmonary disease, unspecified: Secondary | ICD-10-CM

## 2018-10-01 DIAGNOSIS — J479 Bronchiectasis, uncomplicated: Secondary | ICD-10-CM

## 2018-10-01 DIAGNOSIS — I272 Pulmonary hypertension, unspecified: Secondary | ICD-10-CM

## 2018-10-01 DIAGNOSIS — G4733 Obstructive sleep apnea (adult) (pediatric): Secondary | ICD-10-CM | POA: Diagnosis not present

## 2018-10-01 DIAGNOSIS — J4541 Moderate persistent asthma with (acute) exacerbation: Secondary | ICD-10-CM | POA: Diagnosis not present

## 2018-10-01 LAB — PULMONARY FUNCTION TEST
DL/VA % pred: 98 %
DL/VA: 4.13 ml/min/mmHg/L
DLCO unc % pred: 69 %
DLCO unc: 11.94 ml/min/mmHg
FEF 25-75 Pre: 1.01 L/sec
FEF2575-%Pred-Pre: 65 %
FEV1-%Pred-Pre: 73 %
FEV1-Pre: 1.36 L
FEV1FVC-%Pred-Pre: 100 %
FEV6-%Pred-Pre: 74 %
FEV6-Pre: 1.76 L
FEV6FVC-%Pred-Pre: 102 %
FVC-%Pred-Pre: 72 %
FVC-Pre: 1.81 L
Pre FEV1/FVC ratio: 75 %
Pre FEV6/FVC Ratio: 97 %

## 2018-10-01 NOTE — Assessment & Plan Note (Signed)
Assessment: Suspected pulmonary hypertension on high-resolution CT  Plan: Patient had a referral to cardiology placed on 08/29/2018 to go to the heart failure clinic with either Dr. Aundra Dubin or Dr. Haroldine Laws Patient reporting that nobody has contacted her, we contacted cardiology and they reported they contacted the patient on 08/29/2018.  Patient provided heart failure clinic telephone number today, heart failure clinic says they will contact the patient today to get patient scheduled

## 2018-10-01 NOTE — Progress Notes (Signed)
_0  ID: [name], female    DOB: 21-Nov-1945, 73 y.o.   MRN: [id number]  Chief Complaint  Patient presents with  . Follow-up    ILD, would like to discuss starting an anti-fibrotic    Referring provider: Leonard Downing, *  HPI:  73 year old female never smoker followed in our office for allergic rhinitis, rhinosinusitis, asthmatic bronchitis, ILD  PMH: GERD, rheumatoid arthritis, ASCVD, IBS Smoker/ Smoking History: Never smoker Maintenance: Plaquenil, Anoro Ellipta, Arava Pt of: [name] for ILD  10/01/2018  - Visit   73 year old female never smoker presenting to our office today for follow-up visit.  Unfortunately patient was supposed be scheduled to see [name] but this had to be changed based off of inpatient hospital staff needs.  The patient is presenting today after doing a spirometry with DLCO to assess her interstitial lung disease and also her hopes to be able to start on an anti-fibrotic based off of her chronic progressive fibrosis.  Since last office visit patient has completed follow-up with rheumatology and they stated they will not be changing any of her rheumatology meds and they believe she is on the best combination with the Plaquenil and [place].  She also has yet to be scheduled with a follow-up at the heart failure clinic at which we referred her to at last office visit.  Patient also has been going to pulmonary rehab, but this is now been temporarily canceled due to Covid 19.  Pulmonary function test today results are listed below  10/01/2018- spirometry with DLCO-FVC 1.81 (72% predicted), ratio 75, FEV1 1.36 (73% predicted), DLCO 69  Spirometry with DLCO results today show worsened restriction in comparison to 2017 pulmonary function test and stable DLCO.  February/2020 CT high resolution CT results listed below which showed progression of her interstitial lung disease.   Tests:   PFT 09/03/2015-mild obstructive airways disease,  minimal restriction, mild diffusion defect. FVC 2.17/83%, FEV1 1.80/92%, ratio 0.83. No response to bronchodilator. TLC 78%, DLCO 73%.  10/01/2018- spirometry with DLCO-FVC 1.81 (72% predicted), ratio 75, FEV1 1.36 (73% predicted), DLCO 69   Office Spirometry 02/21/17-moderate restriction consistent with body habitus. FVC 1.70/66%, FEV1 1.49/77%, ratio 0.87, FEF 25-75% 2.39/146%.  Unattended Home Sleep Test 08/12/2016-AHI 18.2/hour, desaturation to 75% with average 88%, body weight 180 pounds CPAP titration sleep study 11/09/16.-6 cwp-  Needed supplemental oxygen because of sustained low saturation with 17.5 minutes recording saturation less than 88% on CPAP  Imaging:   12/30/2016-chest x-ray- no active cardiopulmonary disease 03/14/2016-CT chest high-res- mild basilar predominant subpleural reticulation and traction bronchiolectasis, scattered tiny pulmonary nodules measuring 4 mm or less 02/02/2018-CT chest high-res-Spectrum of findings compatible for UIP, presumably on the basis of patient's history of rheumatoid arthritis, mild patchy air trapping, stable dilated main pulmonary artery, traction bronchiectasis, tiny area of honeycombing in the right upper lobe 08/20/2018-CT chest high-res- severe tracheomalacia, interval progression of basilar predominant fibrotic interstitial lung disease with particularly increased groundglass component no honeycombing, 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 on the basis of patient's rheumatoid arthritis, stable dilated main pulmonary artery  Cardiac:  Nuclear stress test 05/20/2016-no evidence of significant blockage Echocardiogram 05/11/2016- WNL EF 40-98%, grade 1 diastolic dysfunction  Micro: 09/07/2016-sputum culture-abundant gram-positive cocci in pairs and clusters, scant growth of yeast    FENO:  Lab Results  Component Value Date   NITRICOXIDE 23 07/31/2018    PFT: PFT Results Latest Ref Rng & Units 10/01/2018 09/03/2015  FVC-Pre L 1.81 2.18   FVC-Predicted Pre %  72 84  FVC-Post L - 2.17  FVC-Predicted Post % - 83  Pre FEV1/FVC % % 75 79  Post FEV1/FCV % % - 83  FEV1-Pre L 1.36 1.73  FEV1-Predicted Pre % 73 88  FEV1-Post L - 1.80  DLCO UNC% % 69 73  DLCO COR %Predicted % 98 94  TLC L - 3.63  TLC % Predicted % - 78  RV % Predicted % - 61    Imaging: No results found.    Specialty Problems      Pulmonary Problems   ASTHMATIC BRONCHITIS, ACUTE    Qualifier: Diagnosis of  By: Annamaria Boots MD, Clinton D       Seasonal and perennial allergic rhinitis    Allergy vaccine 1:50,000 07/13/00; 1:10 06/11/08 GO       RHINOSINUSITIS, CHRONIC    Qualifier: Diagnosis of  By: Annamaria Boots MD, Clinton D       Asthmatic bronchitis, moderate persistent, uncomplicated    Office Spirometry 02/21/17-moderate restriction consistent with body habitus. FVC 1.70/66%, FEV1 1.49/77%, ratio 0.87, FEF 25-75% 2.39/146%.  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 on the basis of patient's rheumatoid arthritis, stable dilated main pulmonary artery  10/01/2018- spirometry with DLCO-FVC 1.81 (72% predicted), ratio 75, FEV1 1.36 (73% predicted), DLCO 69       Obstructive sleep apnea    Unattended Home Sleep Test 08/12/2016-AHI 18.2/hour, desaturation to 75% with average 88%, body weight 180 pounds CPAP titration sleep study 11/09/16.-6 cwp-  Needed supplemental oxygen because of sustained low saturation  with 17.5 minutes recording saturation less than 88% on CPAP      Shortness of breath   Acute on chronic respiratory failure with hypoxia (HCC)   Bronchiectasis without complication ([place])    02/17/2121-QM chest high-res-Spectrum of findings compatible for UIP, presumably on the basis of patient's history of rheumatoid arthritis, mild patchy air trapping, stable dilated main pulmonary artery, traction bronchiectasis, tiny area of honeycombing in the right upper lobe         Allergies  Allergen Reactions  . Cefdinir Diarrhea  . Erythromycin Base Diarrhea    Other  . Lactose Intolerance (Gi) Diarrhea  . Lactulose Diarrhea  . Mesalamine Nausea Only  . Nitrofuran Derivatives     shakiness  . Nitrofurantoin Other (See Comments)    shakiness other  . Other Diarrhea and Other (See Comments)    Shaking uncontrollably "lettuce only" "lettuce only"  . Sulfa Antibiotics Other (See Comments)    Patient can't recall reaction   . Tdap [Tetanus-Diphth-Acell Pertussis]     Shaking uncontrollably  . Tetanus Toxoid, Adsorbed Other (See Comments)    Shaking uncontrollable     Immunization History  Administered Date(s) Administered  . Influenza Split 03/18/2012, 03/18/2013, 04/14/2014, 04/17/2017  . Influenza Whole 04/30/2008, 04/20/2009, 04/18/2011  . Influenza, High Dose Seasonal PF 04/23/2015, 03/24/2016, 04/06/2018  . Pneumococcal Conjugate-13 11/11/2013  . Pneumococcal Polysaccharide-23 05/21/2018  . Zoster Recombinat (Shingrix) 05/05/2017  Past Medical History:  Diagnosis Date  . Acute asthmatic bronchitis   . Allergic rhinitis   . Esophageal reflux   . Irritable bowel syndrome   . Rheumatoid arthritis(714.0)     Tobacco History: Social History   Tobacco Use  Smoking Status Never Smoker  Smokeless Tobacco Never Used  Tobacco Comment   positive passive tobacco smoke exposure   Counseling given: Not Answered Comment: positive passive tobacco smoke exposure   Continue to not smoke  Outpatient Encounter Medications as of 10/01/2018  Medication Sig  . ALPRAZolam (XANAX) 0.5 MG tablet Take 0.5 mg by mouth as needed.  [name] ELLIPTA 62.5-25 MCG/INH AEPB USE 1 INHALATION DAILY  . aspirin EC 81 MG tablet Take 81 mg by mouth daily.  . benzonatate (TESSALON) 200 MG capsule TAKE 1 CAPSULE BY MOUTH 3 TIMES A DAY AS NEEDED FOR COUGH  . cetirizine (ZYRTEC) 10 MG tablet Take 10 mg by mouth daily.  . clidinium-chlordiazePOXIDE (LIBRAX) 5-2.5 MG capsule Take 1 capsule by mouth 2 (two) times daily as needed (for IBS).  . colestipol (COLESTID) 1 G tablet Take 1 g by mouth 2 (two) times daily.   . [facility] D3 5000 units capsule Take 5,000 Units by mouth daily.  Marland Kitchen FLUoxetine (PROZAC) 20 MG capsule Take 20 mg by mouth daily.  . fluticasone (FLONASE) 50 MCG/ACT nasal spray Place 2 sprays into the nose daily.   . furosemide (LASIX) 40 MG tablet TAKE 1 TABLET BY MOUTH TWO  TIMES DAILY  . glycopyrrolate (ROBINUL) 1 MG tablet Take 1 mg by mouth 3 (three) times daily as needed (for IBS).   . hydroxychloroquine (PLAQUENIL) 200 MG tablet Take 600 mg by mouth daily.   . hydroxypropyl methylcellulose / hypromellose (ISOPTO TEARS / GONIOVISC) 2.5 % ophthalmic solution Place 1-2 drops into both eyes 3 (three) times daily as needed for dry eyes.  Marland Kitchen ibuprofen (ADVIL,MOTRIN) 400 MG tablet Take 400 mg by mouth as needed.  Marland Kitchen ipratropium (ATROVENT) 0.03 % nasal spray USE 1 TO 2 SPRAYS INTO BOTH NOSTRILS 2 TIMES DAILY.  Marland Kitchen ipratropium-albuterol (DUONEB) 0.5-2.5 (3) MG/3ML SOLN Take 3 mLs by nebulization every 6 (six) hours as needed.  . leflunomide (ARAVA) 20 MG tablet Take 20 mg by mouth daily.   . mirtazapine (REMERON) 45 MG tablet Take 45 mg by mouth at bedtime.  . montelukast (SINGULAIR) 10 MG tablet TAKE 1 TABLET BY MOUTH AT  BEDTIME  . Multiple Vitamin (MULTIVITAMIN) tablet Take 1 tablet by mouth daily.  Marland Kitchen omeprazole (PRILOSEC) 40 MG capsule Take 40 mg by mouth daily.  . potassium  chloride SA (K-DUR,KLOR-CON) 20 MEQ tablet TAKE 1 TABLET BY MOUTH 3  TIMES DAILY  . PROAIR HFA 108 (90 Base) MCG/ACT inhaler USE 2 PUFFS BY MOUTH EVERY  6 HOURS AS NEEDED  . rosuvastatin (CRESTOR) 20 MG tablet TAKE 1 TABLET BY MOUTH  DAILY  . Spacer/Aero-Holding Chambers (AEROCHAMBER MV) inhaler Use as instructed  . traMADol (ULTRAM) 50 MG tablet Take 50 mg by mouth 2 (two) times daily.   . verapamil (CALAN-SR) 240 MG CR tablet Take 240 mg by mouth daily.  . [DISCONTINUED] amoxicillin-clavulanate (AUGMENTIN) 875-125 MG tablet Take 1 tablet by mouth 2 (two) times daily. (Patient not taking: Reported on 10/01/2018)  . [DISCONTINUED] predniSONE (DELTASONE) 10 MG tablet Take as instructed on AVS.   No facility-administered encounter medications on file as of 10/01/2018.      Review of Systems  Review of Systems  Constitutional: Positive for fatigue.  Negative for chills, fever and unexpected weight change.  HENT: Positive for congestion, postnasal drip and rhinorrhea. Negative for ear pain, sinus pressure and sinus pain.   Respiratory: Positive for shortness of breath. Negative for cough, chest tightness and wheezing.   Cardiovascular: Negative for chest pain and palpitations.  Gastrointestinal: Negative for diarrhea, nausea and vomiting.  Musculoskeletal: Negative for arthralgias.  Skin: Negative for color change.  Allergic/Immunologic: Negative for environmental allergies and food allergies.  Neurological: Negative for dizziness, light-headedness and headaches.  Psychiatric/Behavioral: Negative for dysphoric mood. The patient is not nervous/anxious.   All other systems reviewed and are negative.    Physical Exam  BP 136/68 (BP Location: Left Arm, Cuff Size: Normal)   Pulse 90   Temp 98.1 F (36.7 C) (Oral)   Ht _0  (1.549 m)   Wt 201 lb 12.8 oz (91.5 kg)   SpO2 98%   BMI 38.13 kg/m   Wt Readings from Last 5 Encounters:  10/01/18 201 lb 12.8 oz (91.5 kg)  09/10/18 196 lb 3.4 oz  (89 kg)  08/29/18 199 lb 12.8 oz (90.6 kg)  07/31/18 205 lb (93 kg)  07/02/18 203 lb 6.4 oz (92.3 kg)     Physical Exam  Constitutional: She is oriented to person, place, and time and well-developed, well-nourished, and in no distress. No distress.  Chronically ill elderly female  HENT:  Head: Normocephalic and atraumatic.  Right Ear: Hearing, tympanic membrane, external ear and ear canal normal.  Left Ear: Hearing, tympanic membrane, external ear and ear canal normal.  Nose: Nose normal. Right sinus exhibits no maxillary sinus tenderness and no frontal sinus tenderness. Left sinus exhibits no maxillary sinus tenderness and no frontal sinus tenderness.  Mouth/Throat: Uvula is midline and oropharynx is clear and moist. No oropharyngeal exudate.  Eyes: Pupils are equal, round, and reactive to light.  Neck: Normal range of motion. Neck supple.  Cardiovascular: Normal rate, regular rhythm and normal heart sounds.  Pulmonary/Chest: Effort normal and breath sounds normal. No accessory muscle usage. No respiratory distress. She has no decreased breath sounds. She has no wheezes. She has no rhonchi.  Bibasilar crackles Barrel chest  Abdominal: Soft. Bowel sounds are normal. She exhibits no distension. There is no abdominal tenderness.  Obese  Musculoskeletal: Normal range of motion.        General: No edema.  Lymphadenopathy:    She has no cervical adenopathy.  Neurological: She is alert and oriented to person, place, and time. Gait normal.  Skin: Skin is warm and dry. She is not diaphoretic. No erythema.  Psychiatric: Mood, memory, affect and judgment normal.  Nursing note and vitals reviewed.     Lab Results:  CBC    Component Value Date/Time   WBC 9.0 07/31/2018 1735   RBC 4.30 07/31/2018 1735   HGB 13.0 07/31/2018 1735   HGB 14.4 09/23/2016 1252   HCT 40.0 07/31/2018 1735   HCT 44.7 09/23/2016 1252   PLT 272.0 07/31/2018 1735   PLT 342 09/23/2016 1252   MCV 93.2 07/31/2018  1735   MCV 88 09/23/2016 1252   MCH 28.3 09/23/2016 1252   MCH 29.6 08/03/2013 0548   MCHC 32.4 07/31/2018 1735   RDW 13.9 07/31/2018 1735   RDW 15.4 09/23/2016 1252   LYMPHSABS 1.3 07/31/2018 1735   LYMPHSABS 1.2 09/23/2016 1252   MONOABS 0.9 07/31/2018 1735   EOSABS 0.6 07/31/2018 1735   EOSABS 0.0 09/23/2016 1252   BASOSABS 0.1 07/31/2018 1735   BASOSABS 0.0  09/23/2016 1252    BMET    Component Value Date/Time   NA 138 07/31/2018 1735   NA 142 01/09/2017 1024   K 4.4 07/31/2018 1735   CL 101 07/31/2018 1735   CO2 29 07/31/2018 1735   GLUCOSE 101 (H) 07/31/2018 1735   BUN 17 07/31/2018 1735   BUN 18 01/09/2017 1024   CREATININE 1.02 07/31/2018 1735   CREATININE 0.91 05/30/2016 1002   CALCIUM 10.3 07/31/2018 1735   GFRNONAA 62 01/09/2017 1024   GFRAA 72 01/09/2017 1024    BNP No results found for: BNP  ProBNP    Component Value Date/Time   PROBNP 38.0 07/31/2018 1735      Assessment & Plan:     ILD (interstitial lung disease) ([place]) Assessment: February/2020 high-resolution CT shows worsened ILD with UIP pattern GGO on recent high-res CT Patient with known diagnosis of rheumatoid arthritis managed by Dr. Trudie Reed, currently on Plaquenil as well as Arava Bibasilar crackles on exam today Pulmonary function testing today shows worsened restrictive pattern and preserved DLCO  Plan: Start paperwork today for [facility] >>>Patient declined 0FEV based off of side effect profile due to patient's known IBS Continue follow-up with rheumatology Follow-up with [name] in 2 to 4 weeks Restart pulmonary rehab when it opens   Obstructive sleep apnea Assessment: Patient reporting CPAP compliance Patient currently using 4 L of O2 at night  Plan: Continue CPAP use  Rheumatoid arthritis ([place]) Plan: Continue follow-up with rheumatology Continue Plaquenil and Arava  Bronchiectasis without complication ([place]) Assessment: Previous CT showing traction  bronchiectasis Patient utilizes therapy vest at home  Plan: Continue therapy vest  Pulmonary hypertension ([place]) Assessment: Suspected pulmonary hypertension on high-resolution CT  Plan: Patient had a referral to cardiology placed on 08/29/2018 to go to the heart failure clinic with either Dr. Aundra Dubin or Dr. Haroldine Laws Patient reporting that nobody has contacted her, we contacted cardiology and they reported they contacted the patient on 08/29/2018.  Patient provided heart failure clinic telephone number today, heart failure clinic says they will contact the patient today to get patient scheduled     Lauraine Rinne, NP 10/01/2018   This appointment was 44 min long with over 50% of the time in direct face-to-face patient care, assessment, plan of care, and follow-up.

## 2018-10-01 NOTE — Assessment & Plan Note (Signed)
Assessment: Patient reporting CPAP compliance Patient currently using 4 L of O2 at night  Plan: Continue CPAP use

## 2018-10-01 NOTE — Assessment & Plan Note (Signed)
Plan: Continue follow-up with rheumatology Continue Plaquenil and Arava

## 2018-10-01 NOTE — Assessment & Plan Note (Signed)
Assessment: Previous CT showing traction bronchiectasis Patient utilizes therapy vest at home  Plan: Continue therapy vest

## 2018-10-01 NOTE — Assessment & Plan Note (Signed)
Assessment: February/2020 high-resolution CT shows worsened ILD with UIP pattern GGO on recent high-res CT Patient with known diagnosis of rheumatoid arthritis managed by Dr. Trudie Reed, currently on Plaquenil as well as Arava Bibasilar crackles on exam today Pulmonary function testing today shows worsened restrictive pattern and preserved DLCO  Plan: Start paperwork today for [facility] >>>Patient declined 0FEV based off of side effect profile due to patient's known IBS Continue follow-up with rheumatology Follow-up with [name] in 2 to 4 weeks Restart pulmonary rehab when it opens

## 2018-10-01 NOTE — Patient Instructions (Signed)
We will start the paperwork for Esbriet today   Continue Anoro Ellipta  >>> Take 1 puff daily in the morning right when you wake up >>>Rinse your mouth out after use >>>This is a daily maintenance inhaler, NOT a rescue inhaler >>>Contact our office if you are having difficulties affording or obtaining this medication >>>It is important for you to be able to take this daily and not miss any doses   Referral to cardiology/ HF clinic - Dr. Aundra Dubin or Dr. Tempie Hoist per [name] >>>we are checking on the status of this referral   Keep follow up with Dr. Trudie Reed  Continue Pulmonary Rehab as planned  >>>when it restarts   Overnight Oximetry ordered  >>>Completed on 4L and with CPAP      We recommend that you continue using your CPAP daily >>>Keep up the hard work using your device >>> Goal should be wearing this for the entire night that you are sleeping, at least 4 to 6 hours  Remember:   Do not drive or operate heavy machinery if tired or drowsy.   Please notify the supply company and office if you are unable to use your device regularly due to missing supplies or machine being broken.   Work on maintaining a healthy weight and following your recommended nutrition plan   Maintain proper daily exercise and movement   Maintaining proper use of your device can also help improve management of other chronic illnesses such as: Blood pressure, blood sugars, and weight management.   BiPAP/ CPAP Cleaning:  >>>Clean weekly, with Dawn soap, and bottle brush.  Set up to air dry.   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 Use a flutter valve 10 breaths twice a day or 4 to 5 breaths 4-5 times a day to help clear mucus out Let us know if you have cough with change in mucus color or fevers or chills.  At that point you would need an antibiotic. Maintain a healthy  nutritious diet, eating whole foods Take your medications as prescribed         Coronavirus (COVID-19) Are you at risk?  Are you at risk for the Coronavirus (COVID-19)?  To be considered HIGH RISK for Coronavirus (COVID-19), you have to meet the following criteria:  . Traveled to [place], [place], [place], [place] or [place]; or in the [place] to [place], [place], [place], or [place]; and have fever, cough, and shortness of breath within the last 2 weeks of travel OR . Been in close contact with a person diagnosed with COVID-19 within the last 2 weeks and have fever, cough, and shortness of breath . IF YOU DO NOT MEET THESE CRITERIA, YOU ARE CONSIDERED LOW RISK FOR COVID-19.  What to do if you are HIGH RISK for COVID-19?  Marland Kitchen If you are having a medical emergency, call 911. . Seek medical care right away. Before you go to a doctor's office, urgent care or emergency department, call ahead and tell them about your recent travel, contact with someone diagnosed with COVID-19, and your symptoms. You should receive instructions from your physician's office regarding next steps of care.  . When you arrive at healthcare provider, tell the healthcare staff immediately you have returned from visiting [place], [place], [place], [place] or [place]; or traveled in the [place] to [place], [place], [place], or [place]; in the last two weeks or  you have been in close contact with a person diagnosed with COVID-19 in the last 2 weeks.   . Tell the health care staff about your symptoms: fever, cough and shortness of breath. . After you have been seen by a medical provider, you will be either: o Tested for (COVID-19) and discharged home on quarantine except to seek medical care if symptoms worsen, and asked to  - Stay home and avoid contact with others until you get your results (4-5 days)  - Avoid travel on public transportation if possible (such as bus, train, or airplane) or o Sent  to the Emergency Department by EMS for evaluation, COVID-19 testing, and possible admission depending on your condition and test results.  What to do if you are LOW RISK for COVID-19?  Reduce your risk of any infection by using the same precautions used for avoiding the common cold or flu:  Marland Kitchen Wash your hands often with soap and warm water for at least 20 seconds.  If soap and water are not readily available, use an alcohol-based hand sanitizer with at least 60% alcohol.  . If coughing or sneezing, cover your mouth and nose by coughing or sneezing into the elbow areas of your shirt or coat, into a tissue or into your sleeve (not your hands). . Avoid shaking hands with others and consider head nods or verbal greetings only. . Avoid touching your eyes, nose, or mouth with unwashed hands.  . Avoid close contact with people who are sick. . Avoid places or events with large numbers of people in one location, like concerts or sporting events. . Carefully consider travel plans you have or are making. . If you are planning any travel outside or inside the [place], visit the CDC's Travelers' Health webpage for the latest health notices. . If you have some symptoms but not all symptoms, continue to monitor at home and seek medical attention if your symptoms worsen. . If you are having a medical emergency, call 911.   [place] / e-Visit: [email or web address]         [facility]: [facility]: [phone]                   [facility]: [phone]       Follow up with [name] at his first available or 4 weeks from now >>>If nothing available please let Wyn Quaker FNP know      It is flu season:   >>> Best ways to protect herself from the flu: Receive the yearly flu vaccine, practice good hand hygiene washing with soap and also using hand  sanitizer when available, eat a nutritious meals, get adequate rest, hydrate appropriately   Please contact the office if your symptoms worsen or you have concerns that you are not improving.   Thank you for choosing [facility] for your healthcare, and for allowing [place] to partner with you on your healthcare journey. I am thankful to be able to provide care to you today.   Wyn Quaker FNP-C

## 2018-10-01 NOTE — Telephone Encounter (Signed)
Pt called informing pt of Cardiac and Pulmonary rehab closure for the next 2 weeks.   Joycelyn Man RN, BSN Cardiac and Pulmonary Rehab RN

## 2018-10-01 NOTE — Progress Notes (Signed)
Spirometry and DLCO performed today.

## 2018-10-02 ENCOUNTER — Ambulatory Visit (HOSPITAL_COMMUNITY): Payer: Medicare Other

## 2018-10-03 ENCOUNTER — Ambulatory Visit: Payer: Medicare Other | Admitting: Internal Medicine

## 2018-10-03 ENCOUNTER — Other Ambulatory Visit: Payer: Self-pay | Admitting: Internal Medicine

## 2018-10-04 ENCOUNTER — Encounter: Payer: Self-pay | Admitting: Pulmonary Disease

## 2018-10-04 ENCOUNTER — Telehealth: Payer: Self-pay | Admitting: Pulmonary Disease

## 2018-10-04 ENCOUNTER — Ambulatory Visit (HOSPITAL_COMMUNITY): Payer: Medicare Other

## 2018-10-04 NOTE — Telephone Encounter (Signed)
Received PA request on Esbriet from [place] and initiated request on [place] to double check that they received request  They did and the reference number is [id number]  Will hold in my basket to f/u on

## 2018-10-05 ENCOUNTER — Telehealth: Payer: Self-pay | Admitting: Pulmonary Disease

## 2018-10-05 DIAGNOSIS — R0902 Hypoxemia: Secondary | ICD-10-CM

## 2018-10-05 NOTE — Telephone Encounter (Signed)
Spoke with pt and notified of results per Wyn Quaker, FNP. Pt verbalized understanding and denied any questions. Order sent to DME- [facility]

## 2018-10-05 NOTE — Telephone Encounter (Signed)
10/05/2018 1509  We received patient's overnight oximetry results.    10/04/2018-overnight oximetry- patient slept for a total of 3 hours and 57 minutes and 6 seconds.  Patient's SPO2 was below 88% for 1 hour and 41 minutes and 10 seconds.   >>>Patient completed this test on 4 L via her CPAP.   Based off these results please inform the patient as well as her DME company that she will need to be on 6 L via her CPAP at night.  Patient can continue 4 L during the day.  Please place the orders.  This can be discussed in more detail at next office visit with [name].  Wyn Quaker, FNP

## 2018-10-08 ENCOUNTER — Encounter (HOSPITAL_COMMUNITY): Payer: Self-pay

## 2018-10-08 NOTE — Addendum Note (Signed)
Encounter addended by: Philis Kendall on: 10/08/2018 10:23 AM  Actions taken: Flowsheet data copied forward, Visit Navigator Flowsheet section accepted

## 2018-10-08 NOTE — Telephone Encounter (Signed)
Spoke with Lam and gave prescription information- Esbriet 267 mg tablets 3 tid   He was not able to tell me if med has been approved or not yet  Called [facility] PA line [phone]  Placed on long hold- [facility] 10/08/2018

## 2018-10-08 NOTE — Addendum Note (Signed)
Encounter addended by: Towanda Malkin, RN on: 10/08/2018 2:18 PM  Actions taken: Vitals modified, Flowsheet data copied forward, Visit Navigator Flowsheet section accepted, Clinical Note Signed

## 2018-10-08 NOTE — Telephone Encounter (Signed)
Holly from [place] is [employer] back [phone]  [phone]

## 2018-10-08 NOTE — Telephone Encounter (Signed)
Okay to place order for 267 mg tablets.  This will provide flexibility to be able to slowly initiate the medication as well as titrate down if patient has side effects.  If patient maintains a full dosing then we can switch to 801 mg tablets 3 times daily.  Please place the order HUD:[id number] 267 mg tablet>>>Take 3 tablets (800 mg total) 3 times daily >>> Take with food  Please place the order.Thank you for following up.Wyn Quaker, FNP

## 2018-10-08 NOTE — Telephone Encounter (Signed)
Spoke with Herbie Baltimore at [facility]  He is needing the maintenance rx for Esbriet- it comes in 267 mg tablets up to 3 tid or 801 mg tablets 1 tid  Please advise on rx thanks   Note to triage- pharmacy can be reached easily by calling [phone] opt 2 and then opt 1  Please advise thanks

## 2018-10-08 NOTE — Telephone Encounter (Signed)
Samantha from [place] is calling back [phone]

## 2018-10-08 NOTE — Progress Notes (Signed)
Pulmonary Individual Treatment Plan  Patient Details  Name: STORMEE DUDA MRN: [id number] Date of Birth: [birth year] Referring Provider:     Pulmonary Rehab Walk Test from 09/13/2018 in [facility]  Referring Provider  Dr. Annamaria Boots      Initial Encounter Date:    Pulmonary Rehab Walk Test from 09/13/2018 in [facility]  Date  09/13/18      Visit Diagnosis: Interstitial lung disease ([place])  Patient's Home Medications on Admission:   Current Outpatient Medications:  .  ALPRAZolam (XANAX) 0.5 MG tablet, Take 0.5 mg by mouth as needed., Disp: , Rfl:  .  ANORO ELLIPTA 62.5-25 MCG/INH AEPB, USE 1 INHALATION DAILY, Disp: 180 each, Rfl: 2 .  aspirin EC 81 MG tablet, Take 81 mg by mouth daily., Disp: , Rfl:  .  benzonatate (TESSALON) 200 MG capsule, TAKE 1 CAPSULE BY MOUTH 3 TIMES A DAY AS NEEDED FOR COUGH, Disp: 30 capsule, Rfl: 3 .  cetirizine (ZYRTEC) 10 MG tablet, Take 10 mg by mouth daily., Disp: , Rfl:  .  clidinium-chlordiazePOXIDE (LIBRAX) 5-2.5 MG capsule, Take 1 capsule by mouth 2 (two) times daily as needed (for IBS)., Disp: , Rfl:  .  colestipol (COLESTID) 1 G tablet, Take 1 g by mouth 2 (two) times daily. , Disp: , Rfl:  .  [employer] D3 5000 units capsule, Take 5,000 Units by mouth daily., Disp: , Rfl: 2 .  FLUoxetine (PROZAC) 20 MG capsule, Take 20 mg by mouth daily., Disp: , Rfl:  .  fluticasone (FLONASE) 50 MCG/ACT nasal spray, Place 2 sprays into the nose daily. , Disp: , Rfl:  .  furosemide (LASIX) 40 MG tablet, TAKE 1 TABLET BY MOUTH TWO  TIMES DAILY, Disp: 180 tablet, Rfl: 1 .  glycopyrrolate (ROBINUL) 1 MG tablet, Take 1 mg by mouth 3 (three) times daily as needed (for IBS). , Disp: , Rfl:  .  hydroxychloroquine (PLAQUENIL) 200 MG tablet, Take 600 mg by mouth daily. , Disp: , Rfl:  .  hydroxypropyl methylcellulose / hypromellose (ISOPTO TEARS / GONIOVISC) 2.5 % ophthalmic solution, Place 1-2 drops into both eyes 3 (three)  times daily as needed for dry eyes., Disp: , Rfl:  .  ibuprofen (ADVIL,MOTRIN) 400 MG tablet, Take 400 mg by mouth as needed., Disp: , Rfl:  .  ipratropium (ATROVENT) 0.03 % nasal spray, USE 1 TO 2 SPRAYS INTO BOTH NOSTRILS 2 TIMES DAILY., Disp: 90 mL, Rfl: 1 .  ipratropium-albuterol (DUONEB) 0.5-2.5 (3) MG/3ML SOLN, TAKE 3ML PER NEBULIZER EVERY 6 HOURS AS NEEDED, Disp: 90 mL, Rfl: 2 .  leflunomide (ARAVA) 20 MG tablet, Take 20 mg by mouth daily. , Disp: , Rfl:  .  mirtazapine (REMERON) 45 MG tablet, Take 45 mg by mouth at bedtime., Disp: , Rfl:  .  montelukast (SINGULAIR) 10 MG tablet, TAKE 1 TABLET BY MOUTH AT  BEDTIME, Disp: 90 tablet, Rfl: 1 .  Multiple Vitamin (MULTIVITAMIN) tablet, Take 1 tablet by mouth daily., Disp: , Rfl:  .  omeprazole (PRILOSEC) 40 MG capsule, Take 40 mg by mouth daily., Disp: , Rfl:  .  potassium chloride SA (K-DUR,KLOR-CON) 20 MEQ tablet, TAKE 1 TABLET BY MOUTH 3  TIMES DAILY, Disp: 270 tablet, Rfl: 0 .  PROAIR HFA 108 (90 Base) MCG/ACT inhaler, USE 2 PUFFS BY MOUTH EVERY  6 HOURS AS NEEDED, Disp: 25.5 g, Rfl: 3 .  rosuvastatin (CRESTOR) 20 MG tablet, TAKE 1 TABLET BY MOUTH  DAILY, Disp:  90 tablet, Rfl: 0 .  Spacer/Aero-Holding Chambers (AEROCHAMBER MV) inhaler, Use as instructed, Disp: 1 each, Rfl: 0 .  traMADol (ULTRAM) 50 MG tablet, Take 50 mg by mouth 2 (two) times daily. , Disp: , Rfl:  .  verapamil (CALAN-SR) 240 MG CR tablet, Take 240 mg by mouth daily., Disp: , Rfl:   Past Medical History: Past Medical History:  Diagnosis Date  . Acute asthmatic bronchitis   . Allergic rhinitis   . Esophageal reflux   . Irritable bowel syndrome   . Rheumatoid arthritis(714.0)     Tobacco Use: Social History   Tobacco Use  Smoking Status Never Smoker  Smokeless Tobacco Never Used  Tobacco Comment   positive passive tobacco smoke exposure    Labs: Recent Review Flowsheet Data    Labs for ITP Cardiac and Pulmonary Rehab Latest Ref Rng & Units 11/15/2012 09/16/2016  09/16/2016 09/16/2016 01/09/2017   Cholestrol 100 - 199 mg/dL - - - - 127   LDLCALC 0 - 99 mg/dL - - - - 38   LDLDIRECT 0 - 99 mg/dL - - - - 53   HDL >39 mg/dL - - - - 53   Trlycerides 0 - 149 mg/dL - - - - 181(H)   Hemoglobin A1c 4.8 - 5.6 % 6.0(H) - - - -   PHART 7.350 - 7.450 - - 7.366 - -   PCO2ART 32.0 - 48.0 mmHg - - 38.3 - -   HCO3 20.0 - 28.0 mmol/L - 23.7 21.9 23.3 -   TCO2 0 - 100 mmol/L - _0 -   ACIDBASEDEF 0.0 - 2.0 mmol/L - 1.0 3.0(H) 2.0 -   O2SAT % - 70.0 94.0 75.0 -      Capillary Blood Glucose: Lab Results  Component Value Date   GLUCAP 87 08/01/2013   GLUCAP 81 08/01/2013   GLUCAP 156 (H) 09/11/2010     Pulmonary Assessment Scores: Pulmonary Assessment Scores    Row Name 09/10/18 1200 09/13/18 1536       ADL UCSD   ADL Phase  Entry  -    SOB Score total  78  -      CAT Score   CAT Score  pre 26  -      mMRC Score   mMRC Score  -  3       Pulmonary Function Assessment: Pulmonary Function Assessment - 09/10/18 1158      Breath   Bilateral Breath Sounds  Clear;Rales   cracklies in bases   Shortness of Breath  Yes;Limiting activity;Panic with Shortness of Breath;Fear of Shortness of Breath       Exercise Target Goals: Exercise Program Goal: Individual exercise prescription set using results from initial 6 min walk test and THRR while considering  patient's activity barriers and safety.   Exercise Prescription Goal: Initial exercise prescription builds to 30-45 minutes a day of aerobic activity, 2-3 days per week.  Home exercise guidelines will be given to patient during program as part of exercise prescription that the participant will acknowledge.  Activity Barriers & Risk Stratification: Activity Barriers & Cardiac Risk Stratification - 09/10/18 1152      Activity Barriers & Cardiac Risk Stratification   Activity Barriers  Arthritis;Joint Problems;Deconditioning   left foot multiple surgeries, rheumatoid arthritis      6 Minute  Walk: 6 Minute Walk    Row Name 09/13/18 1537         6 Minute Walk   Phase  Initial     Distance  663 feet     Walk Time  5.25 minutes     # of Rest Breaks  1     MPH  1.26     METS  2     RPE  12     Perceived Dyspnea   2     VO2 Peak  4.28     Symptoms  Yes (comment)     Comments  used wheelchair. One seated break for 45 seconds. Left foot pain 2/10     Resting HR  81 bpm     Resting BP  110/72     Resting Oxygen Saturation   95 %     Exercise Oxygen Saturation  during 6 min walk  89 %     Max Ex. HR  115 bpm     Max Ex. BP  142/72     2 Minute Post BP  116/68       Interval HR   1 Minute HR  100     2 Minute HR  110     3 Minute HR  115     4 Minute HR  109     5 Minute HR  108     6 Minute HR  113     2 Minute Post HR  88     Interval Heart Rate?  Yes       Interval Oxygen   Interval Oxygen?  Yes     Baseline Oxygen Saturation %  95 %     1 Minute Oxygen Saturation %  91 %     1 Minute Liters of Oxygen  2 L     2 Minute Oxygen Saturation %  89 %     2 Minute Liters of Oxygen  2 L     3 Minute Oxygen Saturation %  89 %     3 Minute Liters of Oxygen  2 L     4 Minute Oxygen Saturation %  90 %     4 Minute Liters of Oxygen  2 L     5 Minute Oxygen Saturation %  92 %     5 Minute Liters of Oxygen  2 L     6 Minute Oxygen Saturation %  89 %     6 Minute Liters of Oxygen  2 L     2 Minute Post Oxygen Saturation %  95 %     2 Minute Post Liters of Oxygen  2 L        Oxygen Initial Assessment: Oxygen Initial Assessment - 09/13/18 1535      Home Oxygen   Home Oxygen Device  Home Concentrator;E-Tanks    Sleep Oxygen Prescription  Continuous;CPAP    Liters per minute  2    Home Exercise Oxygen Prescription  Continuous    Liters per minute  2    Home at Rest Exercise Oxygen Prescription  Continuous    Liters per minute  2    Compliance with Home Oxygen Use  Yes      Intervention   Short Term Goals  To learn and exhibit compliance with exercise, home and  travel O2 prescription;To learn and understand importance of monitoring SPO2 with pulse oximeter and demonstrate accurate use of the pulse oximeter.;To learn and understand importance of maintaining oxygen saturations>88%;To learn and demonstrate proper pursed lip breathing techniques or other breathing techniques.;To learn and demonstrate proper use of  respiratory medications    Long  Term Goals  Exhibits compliance with exercise, home and travel O2 prescription;Verbalizes importance of monitoring SPO2 with pulse oximeter and return demonstration;Maintenance of O2 saturations>88%;Compliance with respiratory medication;Exhibits proper breathing techniques, such as pursed lip breathing or other method taught during program session;Demonstrates proper use of MDI's       Oxygen Re-Evaluation: Oxygen Re-Evaluation    Row Name 09/18/18 0833 10/08/18 1023           Program Oxygen Prescription   Program Oxygen Prescription  Continuous  Continuous      Liters per minute  2  2        Home Oxygen   Home Oxygen Device  Home Concentrator;E-Tanks  Home Concentrator;E-Tanks      Sleep Oxygen Prescription  Continuous;CPAP  Continuous;CPAP      Liters per minute  2  2      Home Exercise Oxygen Prescription  Continuous  Continuous      Liters per minute  2  2      Home at Rest Exercise Oxygen Prescription  Continuous  Continuous      Liters per minute  2  2      Compliance with Home Oxygen Use  Yes  Yes        Goals/Expected Outcomes   Short Term Goals  To learn and exhibit compliance with exercise, home and travel O2 prescription;To learn and understand importance of monitoring SPO2 with pulse oximeter and demonstrate accurate use of the pulse oximeter.;To learn and understand importance of maintaining oxygen saturations>88%;To learn and demonstrate proper pursed lip breathing techniques or other breathing techniques.;To learn and demonstrate proper use of respiratory medications  To learn and exhibit  compliance with exercise, home and travel O2 prescription;To learn and understand importance of monitoring SPO2 with pulse oximeter and demonstrate accurate use of the pulse oximeter.;To learn and understand importance of maintaining oxygen saturations>88%;To learn and demonstrate proper pursed lip breathing techniques or other breathing techniques.;To learn and demonstrate proper use of respiratory medications      Long  Term Goals  Exhibits compliance with exercise, home and travel O2 prescription;Verbalizes importance of monitoring SPO2 with pulse oximeter and return demonstration;Maintenance of O2 saturations>88%;Compliance with respiratory medication;Exhibits proper breathing techniques, such as pursed lip breathing or other method taught during program session;Demonstrates proper use of MDI's  Exhibits compliance with exercise, home and travel O2 prescription;Verbalizes importance of monitoring SPO2 with pulse oximeter and return demonstration;Maintenance of O2 saturations>88%;Compliance with respiratory medication;Exhibits proper breathing techniques, such as pursed lip breathing or other method taught during program session;Demonstrates proper use of MDI's      Goals/Expected Outcomes  compliance   compliance          Oxygen Discharge (Final Oxygen Re-Evaluation): Oxygen Re-Evaluation - 10/08/18 1023      Program Oxygen Prescription   Program Oxygen Prescription  Continuous    Liters per minute  2      Home Oxygen   Home Oxygen Device  Home Concentrator;E-Tanks    Sleep Oxygen Prescription  Continuous;CPAP    Liters per minute  2    Home Exercise Oxygen Prescription  Continuous    Liters per minute  2    Home at Rest Exercise Oxygen Prescription  Continuous    Liters per minute  2    Compliance with Home Oxygen Use  Yes      Goals/Expected Outcomes   Short Term Goals  To learn and exhibit compliance with exercise,  home and travel O2 prescription;To learn and understand importance  of monitoring SPO2 with pulse oximeter and demonstrate accurate use of the pulse oximeter.;To learn and understand importance of maintaining oxygen saturations>88%;To learn and demonstrate proper pursed lip breathing techniques or other breathing techniques.;To learn and demonstrate proper use of respiratory medications    Long  Term Goals  Exhibits compliance with exercise, home and travel O2 prescription;Verbalizes importance of monitoring SPO2 with pulse oximeter and return demonstration;Maintenance of O2 saturations>88%;Compliance with respiratory medication;Exhibits proper breathing techniques, such as pursed lip breathing or other method taught during program session;Demonstrates proper use of MDI's    Goals/Expected Outcomes  compliance        Initial Exercise Prescription: Initial Exercise Prescription - 09/13/18 1500      Date of Initial Exercise RX and Referring Provider   Date  09/13/18    Referring Provider  Dr. Annamaria Boots      Oxygen   Oxygen  Continuous    Liters  2      NuStep   Level  2    SPM  80    Minutes  17      Arm Ergometer   Level  1    Minutes  17      Track   Laps  6    Minutes  17      Prescription Details   Frequency (times per week)  2    Duration  Progress to 45 minutes of aerobic exercise without signs/symptoms of physical distress      Intensity   THRR 40-80% of Max Heartrate  58-117    Ratings of Perceived Exertion  11-13    Perceived Dyspnea  0-4      Progression   Progression  Continue to progress workloads to maintain intensity without signs/symptoms of physical distress.      Resistance Training   Training Prescription  Yes    Weight  orange bands    Reps  10-15       Perform Capillary Blood Glucose checks as needed.  Exercise Prescription Changes:   Exercise Comments:   Exercise Goals and Review:   Exercise Goals Re-Evaluation : Exercise Goals Re-Evaluation    Row Name 09/18/18 0837 10/08/18 1023           Exercise  Goal Re-Evaluation   Exercise Goals Review  Increase Physical Activity;Increase Strength and Stamina;Able to understand and use rate of perceived exertion (RPE) scale;Able to understand and use Dyspnea scale;Knowledge and understanding of Target Heart Rate Range (THRR);Understanding of Exercise Prescription  Increase Physical Activity;Increase Strength and Stamina;Able to understand and use rate of perceived exertion (RPE) scale;Able to understand and use Dyspnea scale;Knowledge and understanding of Target Heart Rate Range (THRR);Understanding of Exercise Prescription      Comments  Pt will begin exercise on Thursday. Pt was extremely anxious during her walk test. Will monitor and progress as appropriate through the program when she begins.   Pt will begin exercise when our department opens back up.      Expected Outcomes  Through exercise at rehab and at home, the patient will decrease shortness of breath with daily activities and feel confident in carrying out an exercise regime at home.   Through exercise at rehab and at home, the patient will decrease shortness of breath with daily activities and feel confident in carrying out an exercise regime at home.          Discharge Exercise Prescription (Final Exercise Prescription Changes):  Nutrition:  Target Goals: Understanding of nutrition guidelines, daily intake of sodium <1569m, cholesterol <2061m calories 30% from fat and 7% or less from saturated fats, daily to have 5 or more servings of fruits and vegetables.  Biometrics: Pre Biometrics - 09/10/18 1154      Pre Biometrics   Grip Strength  20 kg        Nutrition Therapy Plan and Nutrition Goals: Nutrition Therapy & Goals - 10/03/18 0956      Nutrition Therapy   Diet  general healthful      Personal Nutrition Goals   Nutrition Goal  Pt to identify and limit food sources of sodium, saturated fat, trans fat, and refined carbohydrates    Personal Goal #2  Identify food quantities  necessary to achieve wt loss of 1/2- 2# per week to a goal wt loss of 2.7-10.9 kg (6-24 lb) at graduation from pulmonary rehab    Personal Goal #3  pt able to name foods that affect blood glucose      Intervention Plan   Intervention  Prescribe, educate and counsel regarding individualized specific dietary modifications aiming towards targeted core components such as weight, hypertension, lipid management, diabetes, heart failure and other comorbidities.    Expected Outcomes  Short Term Goal: Understand basic principles of dietary content, such as calories, fat, sodium, cholesterol and nutrients.;Long Term Goal: Adherence to prescribed nutrition plan.       Nutrition Assessments: Nutrition Assessments - 09/19/18 0802      Rate Your Plate Scores   Pre Score  48       Nutrition Goals Re-Evaluation: Nutrition Goals Re-Evaluation    Row Name 09/18/18 0753             Goals   Current Weight  196 lb 3.4 oz (89 kg)          Nutrition Goals Discharge (Final Nutrition Goals Re-Evaluation): Nutrition Goals Re-Evaluation - 09/18/18 0753      Goals   Current Weight  196 lb 3.4 oz (89 kg)       Psychosocial: Target Goals: Acknowledge presence or absence of significant depression and/or stress, maximize coping skills, provide positive support system. Participant is able to verbalize types and ability to use techniques and skills needed for reducing stress and depression.  Initial Review & Psychosocial Screening: Initial Psych Review & Screening - 09/10/18 1208      Initial Review   Current issues with  Current Stress Concerns;Current Depression;History of Depression;Current Psychotropic Meds;Current Sleep Concerns    Source of Stress Concerns  Chronic Illness;Unable to participate in former interests or hobbies;Unable to perform yard/household activities    Comments  Being treated for depression d/t chronic illness and unable to participate in activities      [place]      Barriers   Psychosocial barriers to participate in program  Psychosocial barriers identified (see note)   SOB and rheumatoid arthritis are barriers     Screening Interventions   Interventions  Encouraged to exercise    Expected Outcomes  Short Term goal: Utilizing psychosocial counselor, staff and physician to assist with identification of specific Stressors or current issues interfering with healing process. Setting desired goal for each stressor or current issue identified.;Long Term Goal: Stressors or current issues are controlled or eliminated.;Short Term goal: Identification and review with participant of any Quality of Life or Depression concerns found by scoring the questionnaire.;Long Term goal: The participant improves quality  of Life and PHQ9 Scores as seen by post scores and/or verbalization of changes       Quality of Life Scores:  Scores of 19 and below usually indicate a poorer quality of life in these areas.  A difference of  2-3 points is a clinically meaningful difference.  A difference of 2-3 points in the total score of the Quality of Life Index has been associated with significant improvement in overall quality of life, self-image, physical symptoms, and general health in studies assessing change in quality of life.  PHQ-9: Recent Review Flowsheet Data    Depression screen [facility] 2/9 09/10/2018 05/09/2018   Decreased Interest 0 0   Down, Depressed, Hopeless 1 0   PHQ - 2 Score 1 0   Altered sleeping 3 -   Tired, decreased energy 3 -   Change in appetite 0 -   Feeling bad or failure about yourself  1 -   Trouble concentrating 0 -   Moving slowly or fidgety/restless 0 -   Suicidal thoughts 0 -   Difficult doing work/chores Somewhat difficult -     Interpretation of Total Score  Total Score Depression Severity:  1-4 = Minimal depression, 5-9 = Mild depression, 10-14 = Moderate depression, 15-19 = Moderately severe depression, 20-27 =  Severe depression   Psychosocial Evaluation and Intervention: Psychosocial Evaluation - 09/20/18 0837      Psychosocial Evaluation & Interventions   Interventions  Stress management education;Relaxation education;Encouraged to exercise with the program and follow exercise prescription    Comments  wants to improve her QOL with exercise    Expected Outcomes  able to handle her stress in healthy manners    Continue Psychosocial Services   Follow up required by staff       Psychosocial Re-Evaluation: Psychosocial Re-Evaluation    Row Name 09/20/18 0837 10/08/18 1412           Psychosocial Re-Evaluation   Current issues with  Current Abuse or Neglect to Report;Current Depression;History of Depression;Current Sleep Concerns;Current Psychotropic Meds  -      Comments  Pt called out of exercise on initial start of exercise day 3/5. Will hope to start exrcise on 3/10  Pt has not attended exercise session. Rehab is currently closed and will plan to re-evaulate pt upon departmental reopening.      Expected Outcomes  Pt will report decrease in psychosocial barriers  Pt will report decrease in psychosocial barriers      Interventions  Stress management education;Relaxation education;Encouraged to attend Pulmonary Rehabilitation for the exercise  -      Continue Psychosocial Services   Follow up required by staff  Follow up required by staff      Comments  -  Being treated for depression d/t chronic illness and unable to participate in activities        Initial Review   Source of Stress Concerns  -  Chronic Illness;Unable to participate in former interests or hobbies;Unable to perform yard/household activities         Psychosocial Discharge (Final Psychosocial Re-Evaluation): Psychosocial Re-Evaluation - 10/08/18 1412      Psychosocial Re-Evaluation   Comments  Pt has not attended exercise session. Rehab is currently closed and will plan to re-evaulate pt upon departmental reopening.     Expected Outcomes  Pt will report decrease in psychosocial barriers    Continue Psychosocial Services   Follow up required by staff    Comments  Being treated for depression d/t  chronic illness and unable to participate in activities      Initial Review   Source of Stress Concerns  Chronic Illness;Unable to participate in former interests or hobbies;Unable to perform yard/household activities       Education: Education Goals: Education classes will be provided on a weekly basis, covering required topics. Participant will state understanding/return demonstration of topics presented.  Learning Barriers/Preferences: Learning Barriers/Preferences - 09/10/18 1215      Learning Barriers/Preferences   Learning Barriers  None    Learning Preferences  Computer/Internet;Video;Written Material;Verbal Instruction;Skilled Demonstration;Pictoral;Individual Instruction;Group Instruction       Education Topics: Risk Factor Reduction:  -Group instruction that is supported by a PowerPoint presentation. Instructor discusses the definition of a risk factor, different risk factors for pulmonary disease, and how the heart and lungs work together.     Nutrition for Pulmonary Patient:  -Group instruction provided by PowerPoint slides, verbal discussion, and written materials to support subject matter. The instructor gives an explanation and review of healthy diet recommendations, which includes a discussion on weight management, recommendations for fruit and vegetable consumption, as well as protein, fluid, caffeine, fiber, sodium, sugar, and alcohol. Tips for eating when patients are short of breath are discussed.   Pursed Lip Breathing:  -Group instruction that is supported by demonstration and informational handouts. Instructor discusses the benefits of pursed lip and diaphragmatic breathing and detailed demonstration on how to preform both.     Oxygen Safety:  -Group instruction provided by  PowerPoint, verbal discussion, and written material to support subject matter. There is an overview of "What is Oxygen" and "Why do we need it".  Instructor also reviews how to create a safe environment for oxygen use, the importance of using oxygen as prescribed, and the risks of noncompliance. There is a brief discussion on traveling with oxygen and resources the patient may utilize.   Oxygen Equipment:  -Group instruction provided by [facility] Staff utilizing handouts, written materials, and equipment demonstrations.   Signs and Symptoms:  -Group instruction provided by written material and verbal discussion to support subject matter. Warning signs and symptoms of infection, stroke, and heart attack are reviewed and when to call the physician/911 reinforced. Tips for preventing the spread of infection discussed.   Advanced Directives:  -Group instruction provided by verbal instruction and written material to support subject matter. Instructor reviews Advanced Directive laws and proper instruction for filling out document.   Pulmonary Video:  -Group video education that reviews the importance of medication and oxygen compliance, exercise, good nutrition, pulmonary hygiene, and pursed lip and diaphragmatic breathing for the pulmonary patient.   Exercise for the Pulmonary Patient:  -Group instruction that is supported by a PowerPoint presentation. Instructor discusses benefits of exercise, core components of exercise, frequency, duration, and intensity of an exercise routine, importance of utilizing pulse oximetry during exercise, safety while exercising, and options of places to exercise outside of rehab.     Pulmonary Medications:  -Verbally interactive group education provided by instructor with focus on inhaled medications and proper administration.   Anatomy and Physiology of the Respiratory System and Intimacy:  -Group instruction provided by PowerPoint, verbal discussion, and  written material to support subject matter. Instructor reviews respiratory cycle and anatomical components of the respiratory system and their functions. Instructor also reviews differences in obstructive and restrictive respiratory diseases with examples of each. Intimacy, Sex, and Sexuality differences are reviewed with a discussion on how relationships can change when diagnosed with pulmonary disease. Common sexual concerns  are reviewed.   MD DAY -A group question and answer session with a medical doctor that allows participants to ask questions that relate to their pulmonary disease state.   OTHER EDUCATION -Group or individual verbal, written, or video instructions that support the educational goals of the pulmonary rehab program.   Holiday Eating Survival Tips:  -Group instruction provided by PowerPoint slides, verbal discussion, and written materials to support subject matter. The instructor gives patients tips, tricks, and techniques to help them not only survive but enjoy the holidays despite the onslaught of food that accompanies the holidays.   Knowledge Questionnaire Score: Knowledge Questionnaire Score - 09/10/18 1208      Knowledge Questionnaire Score   Pre Score  12/18       Core Components/Risk Factors/Patient Goals at Admission: Personal Goals and Risk Factors at Admission - 09/10/18 1216      Core Components/Risk Factors/Patient Goals on Admission    Weight Management  Obesity;Weight Loss    Improve shortness of breath with ADL's  Yes    Intervention  Provide education, individualized exercise plan and daily activity instruction to help decrease symptoms of SOB with activities of daily living.    Expected Outcomes  Short Term: Improve cardiorespiratory fitness to achieve a reduction of symptoms when performing ADLs;Long Term: Be able to perform more ADLs without symptoms or delay the onset of symptoms    Stress  Yes    Intervention  Offer individual and/or small  group education and counseling on adjustment to heart disease, stress management and health-related lifestyle change. Teach and support self-help strategies.;Refer participants experiencing significant psychosocial distress to appropriate mental health specialists for further evaluation and treatment. When possible, include family members and significant others in education/counseling sessions.    Expected Outcomes  Short Term: Participant demonstrates changes in health-related behavior, relaxation and other stress management skills, ability to obtain effective social support, and compliance with psychotropic medications if prescribed.;Long Term: Emotional wellbeing is indicated by absence of clinically significant psychosocial distress or social isolation.       Core Components/Risk Factors/Patient Goals Review:  Goals and Risk Factor Review    Row Name 09/10/18 1217 09/20/18 0840 10/08/18 1414         Core Components/Risk Factors/Patient Goals Review   Personal Goals Review  Weight Management/Obesity;Develop more efficient breathing techniques such as purse lipped breathing and diaphragmatic breathing and practicing self-pacing with activity.;Stress;Increase knowledge of respiratory medications and ability to use respiratory devices properly.;Improve shortness of breath with ADL's  Weight Management/Obesity;Develop more efficient breathing techniques such as purse lipped breathing and diaphragmatic breathing and practicing self-pacing with activity.;Stress;Increase knowledge of respiratory medications and ability to use respiratory devices properly.;Improve shortness of breath with ADL's  Weight Management/Obesity;Develop more efficient breathing techniques such as purse lipped breathing and diaphragmatic breathing and practicing self-pacing with activity.;Stress;Increase knowledge of respiratory medications and ability to use respiratory devices properly.;Improve shortness of breath with ADL's      Review  -  Pt called out of initial start date for exercise of 3/5. Will hope to start exercise on 3/10  Pt has not attended an exercise session. Rehab is currently closed and will plan to re-evaulate pt upon departmental reopening.     Expected Outcomes  -  see admission goals and outcomes  see admission goals and outcomes        Core Components/Risk Factors/Patient Goals at Discharge (Final Review):  Goals and Risk Factor Review - 10/08/18 1414      Core Components/Risk  Factors/Patient Goals Review   Personal Goals Review  Weight Management/Obesity;Develop more efficient breathing techniques such as purse lipped breathing and diaphragmatic breathing and practicing self-pacing with activity.;Stress;Increase knowledge of respiratory medications and ability to use respiratory devices properly.;Improve shortness of breath with ADL's    Review  Pt has not attended an exercise session. Rehab is currently closed and will plan to re-evaulate pt upon departmental reopening.    Expected Outcomes  see admission goals and outcomes       ITP Comments: ITP Comments    Row Name 09/20/18 0836 10/08/18 1411         ITP Comments  Dr. Jennet Maduro, Medical Director, Pulmonary Rehab  Dr. Jennet Maduro, Medical Director, Pulmonary Rehab         Comments: ITP REVIEW Pt has not attended exercise sessions and department is currently closed. Will evaluate pt with hopeful attendance with reopening of department.  Joycelyn Man RN, BSN Cardiac and Pulmonary Rehab RN

## 2018-10-09 ENCOUNTER — Ambulatory Visit (HOSPITAL_COMMUNITY): Payer: Medicare Other

## 2018-10-09 ENCOUNTER — Telehealth: Payer: Self-pay | Admitting: *Deleted

## 2018-10-09 ENCOUNTER — Telehealth (HOSPITAL_COMMUNITY): Payer: Self-pay

## 2018-10-09 NOTE — Telephone Encounter (Signed)
TELEPHONE CALL NOTE  KADYN CHOVAN has been deemed a candidate for a follow-up tele-health visit to limit community exposure during the Covid-19 pandemic. I spoke with the patient via phone to ensure availability of phone/video source, confirm preferred email & phone number, discuss instructions and expectations, and review consent.   I reminded RASHIDAH BELLEVILLE to be prepared with any vital sign and/or heart rhythm information that could potentially be obtained via home monitoring, at the time of her visit.  Finally, I reminded VENA BASSINGER to expect an e-mail containing a link for their video-based visit approximately 15 minutes before her visit, or alternatively, a phone call at the time of her visit if her visit is planned to be a phone encounter.  Did the patient verbally consent to treatment as below? YES  Britt Bottom, CMA 10/09/2018 1:29 PM  DOWNLOADING THE SOFTWARE (If applicable)  Download the [employer] app to enable video and telephone visits with your [facility] Provider.   Instructions for downloading [employer] Web[employer]: - Go to https://www.webex.com/downloads.html and follow the instructions - If you have technical difficulties with downloading WebEx, please call WebEx at [phone]. - Once the app is downloaded (can be done on either mobile or desktop computer), go to Settings in the upper left hand corner.  Be sure that camera and audio are enabled.  - You will receive an email message with a link to the meeting with a time to join for your tele-health visit.  - Please download the app and have settings configured prior to the appointment time.    CONSENT FOR TELE-HEALTH VISIT - PLEASE REVIEW  I hereby voluntarily request, consent and authorize [facility] and its employed or contracted physicians, physician assistants, nurse practitioners or other licensed health care professionals (the Practitioner), to provide me with telemedicine health care services (the  "Services") as deemed necessary by the treating Practitioner. I acknowledge and consent to receive the Services by the Practitioner via telemedicine. I understand that the telemedicine visit will involve communicating with the Practitioner through live audiovisual communication technology and the disclosure of certain medical information by electronic transmission. I acknowledge that I have been given the opportunity to request an in-person assessment or other available alternative prior to the telemedicine visit and am voluntarily participating in the telemedicine visit.  I understand that I have the right to withhold or withdraw my consent to the use of telemedicine in the course of my care at any time, without affecting my right to future care or treatment, and that the Practitioner or I may terminate the telemedicine visit at any time. I understand that I have the right to inspect all information obtained and/or recorded in the course of the telemedicine visit and may receive copies of available information for a reasonable fee.  I understand that some of the potential risks of receiving the Services via telemedicine include:  Marland Kitchen Delay or interruption in medical evaluation due to technological equipment failure or disruption; . Information transmitted may not be sufficient (e.g. poor resolution of images) to allow for appropriate medical decision making by the Practitioner; and/or  . In rare instances, security protocols could fail, causing a breach of personal health information.  Furthermore, I acknowledge that it is my responsibility to provide information about my medical history, conditions and care that is complete and accurate to the best of my ability. I acknowledge that Practitioner's advice, recommendations, and/or decision may be based on factors not within their control, such as  incomplete or inaccurate data provided by me or distortions of diagnostic images or specimens that may result from  electronic transmissions. I understand that the practice of medicine is not an exact science and that Practitioner makes no warranties or guarantees regarding treatment outcomes. I acknowledge that I will receive a copy of this consent concurrently upon execution via email to the email address I last provided but may also request a printed copy by calling the office of [place].    I understand that my insurance will be billed for this visit.   I have read or had this consent read to me. . I understand the contents of this consent, which adequately explains the benefits and risks of the Services being provided via telemedicine.  . I have been provided ample opportunity to ask questions regarding this consent and the Services and have had my questions answered to my satisfaction. . I give my informed consent for the services to be provided through the use of telemedicine in my medical care  By participating in this telemedicine visit I agree to the above.

## 2018-10-10 ENCOUNTER — Other Ambulatory Visit: Payer: Self-pay | Admitting: Internal Medicine

## 2018-10-10 NOTE — Progress Notes (Signed)
Virtual Visit via Telephone Note    Evaluation Performed:  Follow-up visit  This visit type was conducted due to national recommendations for restrictions regarding the COVID-19 Pandemic (e.g. social distancing).  This format is felt to be most appropriate for this patient at this time.  All issues noted in this document were discussed and addressed.  No physical exam was performed (except for noted visual exam findings with Video Visits).  Verbal consent was obtained from the patient.  Date:  10/12/2018   ID:  [name], DOB [birth year], MRN [id number]  Patient Location:  [address] [address]   Provider location:   [facility] [place] [facility] [address], [place], [place] [address]  PCP:  Leonard Downing, MD  Cardiologist:  Nelva Bush, MD  Electrophysiologist:  None   Chief Complaint: Shortness of breath  History of Present Illness:    [name] is a 73 y.o. female who presents via audio/video conferencing for a telehealth visit today.  She has a history of nonobstructive coronary artery disease, asthmatic bronchitis, GERD, rheumatoid arthritis, CVA, prediabetes, IBS, and allergic rhinitis.  We are following up today regarding her chronic shortness of breath.  I last saw her in the office in 06/2017, at which time she noted minimal dyspnea.  She was recovering from surgery on her left foot in the summer 4097, which was complicated by a wound infection.  She continued to have issues with wound healing and ultimately required removal of the hardware in her toe.  After lengthy work with the wound care clinic, this has ultimately healed.  She still notes some discoloration and intermittent swelling of the left foot.  She also recently suffered a stress fracture of the hip and is awaiting evaluation by orthopedics.  Since our last visit, [name] has experienced continued worsening in her  breathing.  She now has significant shortness of breath and was also found to be hypoxic requiring initiation of home oxygen (4 L during the day, 6 L at night).  She has been followed by [name] [facility]) with a recent diagnosis of interstitial lung disease.  She was also referred to our heart failure clinic out of concern for concurrent heart failure, though this visit has yet to be scheduled.  [name] has put on quite a bit of weight over the last year, which she attributes to dietary indiscretion and being sedentary.  She has also been on at least one course of steroids with resultant weight gain.  She denies nipping and edema as well as chest pain, palpitations, and lightheadedness.  She is in the process of trying to get approval for Esbriet.  The patient does not endorse symptoms concerning for COVID-19 infection (fever, chills, cough, or new SHORTNESS OF BREATH).    Prior CV studies:   The following studies were reviewed today:  ABI's (07/30/18): 1.0 on the right and 1.1 on the left.  TBI's 0.62 on the right and 0.61 on the left.  Findings are normal.  L/RHC (09/16/16): LMCA normal. LAD with 50% midvessel stenosis. LCx normal. 20% proximal RCA. LVEDP 15. RA 13, RV 38/12, PA 43/23 (32), PCWP 16. Ventricular concordance noted. Fick CO/CI 6.4/3.7. PA sat 70%, RA sat 75%, Ao sat 94%. PVR 2.5 Wood Units.  Carotid Doppler (10/26/16):Heterogeneous plaque, bilaterally. Tortuous mid and distal ICA's, bilaterally. 1-39% bilateral ICA stenosis. Normal subclavian arteries, bilaterally. Patent vertebral arteries with antegrade  flow.Subcentimeter hypoechoic nodule in the thyroid.  Transthoracic echocardiogram (05/11/16): Normal LV size and in traction (EF 55-60%). Normal LV wall motion. Grade 1 diastolic. Normal RV size and function. Mild mitral valve thickening. Mild TR.  Pharmacologic myocardial perfusion stress test (05/11/16): Low risk study without ischemia or scar. LVEF 62%.   Past  Medical History:  Diagnosis Date   Acute asthmatic bronchitis    Allergic rhinitis    Esophageal reflux    Irritable bowel syndrome    Rheumatoid arthritis(714.0)    Past Surgical History:  Procedure Laterality Date   ABDOMINAL HYSTERECTOMY     ANKLE FUSION  2009   left   BACK SURGERY     CHOLECYSTECTOMY     RIGHT/LEFT HEART CATH AND CORONARY ANGIOGRAPHY N/A 09/16/2016   Procedure: Right/Left Heart Cath and Coronary Angiography;  Surgeon: Nelva Bush, MD;  Location: [facility];  Service: Cardiovascular;  Laterality: N/A;   TOTAL KNEE ARTHROPLASTY Bilateral      Current Meds  Medication Sig   ALPRAZolam (XANAX) 0.5 MG tablet Take 0.5 mg by mouth as needed.   ANORO ELLIPTA 62.5-25 MCG/INH AEPB USE 1 INHALATION DAILY   aspirin EC 81 MG tablet Take 81 mg by mouth daily.   benzonatate (TESSALON) 200 MG capsule TAKE 1 CAPSULE BY MOUTH 3 TIMES A DAY AS NEEDED FOR COUGH   cetirizine (ZYRTEC) 10 MG tablet Take 10 mg by mouth daily.   clidinium-chlordiazePOXIDE (LIBRAX) 5-2.5 MG capsule Take 1 capsule by mouth 2 (two) times daily as needed (for IBS).   colestipol (COLESTID) 1 G tablet Take 1 g by mouth 2 (two) times daily.    [employer] D3 5000 units capsule Take 5,000 Units by mouth daily.   FLUoxetine (PROZAC) 20 MG capsule Take 20 mg by mouth daily.   fluticasone (FLONASE) 50 MCG/ACT nasal spray Place 2 sprays into the nose daily.    furosemide (LASIX) 40 MG tablet TAKE 1 TABLET BY MOUTH TWO  TIMES DAILY   glycopyrrolate (ROBINUL) 1 MG tablet Take 1 mg by mouth 3 (three) times daily as needed (for IBS).    hydroxychloroquine (PLAQUENIL) 200 MG tablet Take 600 mg by mouth daily.    hydroxypropyl methylcellulose / hypromellose (ISOPTO TEARS / GONIOVISC) 2.5 % ophthalmic solution Place 1-2 drops into both eyes 3 (three) times daily as needed for dry eyes.   ibuprofen (ADVIL,MOTRIN) 400 MG tablet Take 400 mg by mouth as needed.   ipratropium (ATROVENT) 0.03 %  nasal spray USE 1 TO 2 SPRAYS INTO BOTH NOSTRILS 2 TIMES DAILY.   ipratropium-albuterol (DUONEB) 0.5-2.5 (3) MG/3ML SOLN TAKE 3ML PER NEBULIZER EVERY 6 HOURS AS NEEDED   leflunomide (ARAVA) 20 MG tablet Take 20 mg by mouth daily.    mirtazapine (REMERON) 45 MG tablet Take 45 mg by mouth at bedtime.   montelukast (SINGULAIR) 10 MG tablet TAKE 1 TABLET BY MOUTH AT  BEDTIME   Multiple Vitamin (MULTIVITAMIN) tablet Take 1 tablet by mouth daily.   omeprazole (PRILOSEC) 40 MG capsule Take 40 mg by mouth daily.   potassium chloride SA (K-DUR,KLOR-CON) 20 MEQ tablet TAKE 1 TABLET BY MOUTH 3  TIMES DAILY (Patient taking differently: Take 20 mEq by mouth 2 (two) times daily. )   PROAIR HFA 108 (90 Base) MCG/ACT inhaler USE 2 PUFFS BY MOUTH EVERY  6 HOURS AS NEEDED   rosuvastatin (CRESTOR) 20 MG tablet TAKE 1 TABLET BY MOUTH  DAILY   Spacer/Aero-Holding Chambers (AEROCHAMBER MV) inhaler Use as instructed  traMADol (ULTRAM) 50 MG tablet Take 50 mg by mouth 2 (two) times daily.    verapamil (CALAN-SR) 240 MG CR tablet Take 240 mg by mouth daily.     Allergies:   Cefdinir; Erythromycin base; Lactose intolerance (gi); Lactulose; Mesalamine; Nitrofuran derivatives; Nitrofurantoin; Other; Sulfa antibiotics; Tdap [tetanus-diphth-acell pertussis]; and Tetanus toxoid, adsorbed   Social History   Tobacco Use   Smoking status: Never Smoker   Smokeless tobacco: Never Used   Tobacco comment: positive passive tobacco smoke exposure  Substance Use Topics   Alcohol use: Yes    Comment: glass of wine each night    Drug use: No     Family Hx: The patient's family history includes Arthritis in her mother; Diabetes in an other family member; Heart attack in her father and another family member; Heart disease in her mother.  ROS:   [name] notes some tingling and discomfort in the left shoulder about a month ago that resolved with Biofreeze.  Otherwise, no chest or arm pain.  All other systems  reviewed and are negative except as noted in the HPI.   Labs/Other Tests and Data Reviewed:    Recent Labs: 07/31/2018: BUN 17; Creatinine, Ser 1.02; Hemoglobin 13.0; Platelets 272.0; Potassium 4.4; Pro B Natriuretic peptide (BNP) 38.0; Sodium 138   Recent Lipid Panel Lab Results  Component Value Date/Time   CHOL 127 01/09/2017 10:24 AM   TRIG 181 (H) 01/09/2017 10:24 AM   HDL 53 01/09/2017 10:24 AM   CHOLHDL 2.4 01/09/2017 10:24 AM   LDLCALC 38 01/09/2017 10:24 AM   LDLDIRECT 53 01/09/2017 10:24 AM    Wt Readings from Last 3 Encounters:  10/12/18 199 lb (90.3 kg)  10/01/18 201 lb 12.8 oz (91.5 kg)  09/10/18 196 lb 3.4 oz (89 kg)     Exam:    Vital Signs:  BP 135/69    Pulse 75    Wt 199 lb (90.3 kg)    SpO2 95% Comment: on 6L O2 via nasal cannula   BMI 37.60 kg/m     ASSESSMENT & PLAN:    Chronic HFpEF: Difficult to assess volume status over the phone.  [name] has put on weight, though she denies significant edema.  Weight gain likely due to inactivity and dietary indiscretion.  I have encouraged her to continue taking furosemide 40 mg p.o. twice daily.  She may also have an element of pulmonary hypertension in the setting of ILD, given that PA pressures were mildly to moderately elevated by right heart cath in 2018.  I think it would be reasonable for her to be assessed in the heart failure clinic; I will help facilitate this, though it will likely need to be deferred until COVID-19 precautions have been eased.  Patient's husband brings up the possibility of magnesium supplementation.  We do not have prior magnesium test in our system.  I am not opposed to this, though I advised [name] that it could worsen her bowel issues, particularly if she takes excessive magnesium supplementation.  Nonobstructive coronary artery disease: I do not think that dyspnea is primarily a manifestation of coronary insufficiency.  [name] is not had any anginal chest pain.  We will continue with  medical therapy to prevent progression of moderate CAD noted by cath in 2018.  Pulmonary fibrosis: Most likely driving much of [name]'s dyspnea and hypoxia.  I will defer ongoing management to [name].  Hyperlipidemia: Last LDL in our system was from 12/2016 and was  excellent at 38.  We will plan to continue rosuvastatin 20 mg daily, though this could be de-escalated in the future based on results of repeat lipid panel.  We will defer obtaining lipids in light of ongoing COVID-19 precautions.  COVID-19 Education: The signs and symptoms of COVID-19 were discussed with the patient and how to seek care for testing (follow up with PCP or arrange E-visit).  The importance of social distancing was discussed today.  Patient Risk:   After full review of this patients clinical status, I feel that they are at least moderate risk at this time.  Time:   Today, I have spent 32 minutes with the patient with telehealth technology discussing progressive hypoxic respiratory failure with potential for HFpEF and pulmonary hypertension as well as COVID-19 precautions.     Medication Adjustments/Labs and Tests Ordered: Current medicines are reviewed at length with the patient today.  Concerns regarding medicines are outlined above.   Tests Ordered: None.  Medication Changes: Consider starting magnesium oxide 200 mg daily at patient's discretino.  Disposition:  in 6 week(s) (vitual visit)  Signed, Nelva Bush, MD  10/12/2018 10:41 AM    [place]

## 2018-10-10 NOTE — Telephone Encounter (Signed)
Patient's visit rescheduled to 10 am due to Dr End having inpatient procedure that morning as well. Patient verbalized understanding of the time change.

## 2018-10-11 ENCOUNTER — Ambulatory Visit (HOSPITAL_COMMUNITY): Payer: Medicare Other

## 2018-10-11 NOTE — Telephone Encounter (Signed)
Benzonatate refill e-sent

## 2018-10-11 NOTE — Telephone Encounter (Signed)
Patient is requesting this medication. It was last refilled on 06/08/2018 with 3 refills by Dr. Annamaria Boots.  Her last OV was 10/01/18 with Wyn Quaker and her Next OV is April 2020 with MR  Dr. Annamaria Boots please advise on refill  Allergies  Allergen Reactions  . Cefdinir Diarrhea  . Erythromycin Base Diarrhea    Other  . Lactose Intolerance (Gi) Diarrhea  . Lactulose Diarrhea  . Mesalamine Nausea Only  . Nitrofuran Derivatives     shakiness  . Nitrofurantoin Other (See Comments)    shakiness other  . Other Diarrhea and Other (See Comments)    Shaking uncontrollably "lettuce only" "lettuce only"  . Sulfa Antibiotics Other (See Comments)    Patient can't recall reaction   . Tdap [Tetanus-Diphth-Acell Pertussis]     Shaking uncontrollably  . Tetanus Toxoid, Adsorbed Other (See Comments)    Shaking uncontrollable    Current Outpatient Medications on File Prior to Visit  Medication Sig Dispense Refill  . ALPRAZolam (XANAX) 0.5 MG tablet Take 0.5 mg by mouth as needed.    [name] ELLIPTA 62.5-25 MCG/INH AEPB USE 1 INHALATION DAILY 180 each 2  . aspirin EC 81 MG tablet Take 81 mg by mouth daily.    . benzonatate (TESSALON) 200 MG capsule TAKE 1 CAPSULE BY MOUTH 3 TIMES A DAY AS NEEDED FOR COUGH 30 capsule 3  . cetirizine (ZYRTEC) 10 MG tablet Take 10 mg by mouth daily.    . clidinium-chlordiazePOXIDE (LIBRAX) 5-2.5 MG capsule Take 1 capsule by mouth 2 (two) times daily as needed (for IBS).    . colestipol (COLESTID) 1 G tablet Take 1 g by mouth 2 (two) times daily.     . [facility] D3 5000 units capsule Take 5,000 Units by mouth daily.  2  . FLUoxetine (PROZAC) 20 MG capsule Take 20 mg by mouth daily.    . fluticasone (FLONASE) 50 MCG/ACT nasal spray Place 2 sprays into the nose daily.     . furosemide (LASIX) 40 MG tablet TAKE 1 TABLET BY MOUTH TWO  TIMES DAILY 180 tablet 1  . glycopyrrolate (ROBINUL) 1 MG tablet Take 1 mg by mouth 3 (three) times daily as needed (for IBS).     . hydroxychloroquine  (PLAQUENIL) 200 MG tablet Take 600 mg by mouth daily.     . hydroxypropyl methylcellulose / hypromellose (ISOPTO TEARS / GONIOVISC) 2.5 % ophthalmic solution Place 1-2 drops into both eyes 3 (three) times daily as needed for dry eyes.    Marland Kitchen ibuprofen (ADVIL,MOTRIN) 400 MG tablet Take 400 mg by mouth as needed.    Marland Kitchen ipratropium (ATROVENT) 0.03 % nasal spray USE 1 TO 2 SPRAYS INTO BOTH NOSTRILS 2 TIMES DAILY. 90 mL 1  . ipratropium-albuterol (DUONEB) 0.5-2.5 (3) MG/3ML SOLN TAKE 3ML PER NEBULIZER EVERY 6 HOURS AS NEEDED 90 mL 2  . leflunomide (ARAVA) 20 MG tablet Take 20 mg by mouth daily.     . mirtazapine (REMERON) 45 MG tablet Take 45 mg by mouth at bedtime.    . montelukast (SINGULAIR) 10 MG tablet TAKE 1 TABLET BY MOUTH AT  BEDTIME 90 tablet 1  . Multiple Vitamin (MULTIVITAMIN) tablet Take 1 tablet by mouth daily.    Marland Kitchen omeprazole (PRILOSEC) 40 MG capsule Take 40 mg by mouth daily.    . potassium chloride SA (K-DUR,KLOR-CON) 20 MEQ tablet TAKE 1 TABLET BY MOUTH 3  TIMES DAILY 270 tablet 0  . PROAIR HFA 108 (90 Base) MCG/ACT inhaler USE 2 PUFFS BY  MOUTH EVERY  6 HOURS AS NEEDED 25.5 g 3  . rosuvastatin (CRESTOR) 20 MG tablet TAKE 1 TABLET BY MOUTH  DAILY 90 tablet 0  . Spacer/Aero-Holding Chambers (AEROCHAMBER MV) inhaler Use as instructed 1 each 0  . traMADol (ULTRAM) 50 MG tablet Take 50 mg by mouth 2 (two) times daily.     . verapamil (CALAN-SR) 240 MG CR tablet Take 240 mg by mouth daily.     No current facility-administered medications on file prior to visit.

## 2018-10-11 NOTE — Telephone Encounter (Signed)
Spoke with Rose at [employer] The PA was cancelled somehow  Will resubmit  PA number [id number]  Pts ID number [id number]

## 2018-10-11 NOTE — Telephone Encounter (Signed)
Let patient know PA still pending. Will let patient know once approved. She will receive a approval/denial letter as well from her ins. Nothing further needed.

## 2018-10-11 NOTE — Telephone Encounter (Signed)
Pt is requesting an update, please.

## 2018-10-12 ENCOUNTER — Telehealth (INDEPENDENT_AMBULATORY_CARE_PROVIDER_SITE_OTHER): Payer: Medicare Other | Admitting: Internal Medicine

## 2018-10-12 ENCOUNTER — Encounter: Payer: Self-pay | Admitting: Internal Medicine

## 2018-10-12 ENCOUNTER — Other Ambulatory Visit: Payer: Self-pay

## 2018-10-12 VITALS — BP 135/69 | HR 75 | Wt 199.0 lb

## 2018-10-12 DIAGNOSIS — E785 Hyperlipidemia, unspecified: Secondary | ICD-10-CM | POA: Diagnosis not present

## 2018-10-12 DIAGNOSIS — I2510 Atherosclerotic heart disease of native coronary artery without angina pectoris: Secondary | ICD-10-CM | POA: Diagnosis not present

## 2018-10-12 DIAGNOSIS — J8410 Pulmonary fibrosis, unspecified: Secondary | ICD-10-CM | POA: Diagnosis not present

## 2018-10-12 DIAGNOSIS — I5032 Chronic diastolic (congestive) heart failure: Secondary | ICD-10-CM | POA: Diagnosis not present

## 2018-10-12 NOTE — Patient Instructions (Addendum)
Medication Instructions:  Your physician recommends that you continue on your current medications as directed. Please refer to the Current Medication list given to you today.  If you need a refill on your cardiac medications before your next appointment, please call your pharmacy.   Lab work: none If you have labs (blood work) drawn today and your tests are completely normal, you will receive your results only by: Marland Kitchen MyChart Message (if you have MyChart) OR . A paper copy in the mail If you have any lab test that is abnormal or we need to change your treatment, we will call you to review the results.  Testing/Procedures: none  Follow-Up: At [facility], you and your health needs are our priority.  As part of our continuing mission to provide you with exceptional heart care, we have created designated Provider Care Teams.  These Care Teams include your primary Cardiologist (physician) and Advanced Practice Providers (APPs -  Physician Assistants and Nurse Practitioners) who all work together to provide you with the care you need, when you need it. You will need a follow up appointment in 6-8 weeks VIRTUAL VISIT.  Please call our office 2 months in advance to schedule this appointment.  You may see Nelva Bush, MD or one of the following Advanced Practice Providers on your designated Care Team:   Murray Hodgkins, NP Christell Faith, PA-C . Marrianne Mood, PA-C    You have been referred to [facility] in [place]. Someone should call you. Please call [phone] if you have not received the call.

## 2018-10-15 ENCOUNTER — Telehealth: Payer: Self-pay | Admitting: Pulmonary Disease

## 2018-10-15 NOTE — Telephone Encounter (Signed)
ATC [facility] unable to reach lmom

## 2018-10-16 ENCOUNTER — Ambulatory Visit (HOSPITAL_COMMUNITY): Payer: Medicare Other

## 2018-10-16 NOTE — Telephone Encounter (Signed)
Called [facility] to talk about Esbriet denial. Unable to reach [facility]

## 2018-10-17 NOTE — Telephone Encounter (Signed)
[employer] [id number]. Confirmed brand Esbriet and the generic Pirfenidone are not covered by the patient plan.  Message routed to Wyn Quaker, NP   [name]: The denial stated the Jacklynn Bue was denied because "the medication is not being used in combination with Esbriet or Ofev", which does not make sense.  Based on this, is there something else that can be prescribed?

## 2018-10-17 NOTE — Telephone Encounter (Signed)
ATC holly to follow up on this lmom

## 2018-10-18 ENCOUNTER — Ambulatory Visit: Payer: Medicare Other | Admitting: Internal Medicine

## 2018-10-18 ENCOUNTER — Encounter: Payer: Self-pay | Admitting: *Deleted

## 2018-10-18 ENCOUNTER — Ambulatory Visit (HOSPITAL_COMMUNITY): Payer: Medicare Other

## 2018-10-18 NOTE — Telephone Encounter (Signed)
Please complete necessary forms.  Thank you for following up.  The patient needs to be started on this medication.  Wyn Quaker, FNP

## 2018-10-18 NOTE — Telephone Encounter (Signed)
Appeals paperwork as well as letter and supporting documentation has been faxed to appeals dept at their expedited fax number. Will await a response.

## 2018-10-18 NOTE — Telephone Encounter (Signed)
This does not make any sense.  The patient should be simply on Esbriet.  The patient is not on OFEV.  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  We need to appeal this decision.  If we continue to have problems we may need to reach out to the aspirin pharmaceutical rep for support of getting this approved from the insurance company.  Please let the patient know that the medication was denied and we are appealing that decision.  Wyn Quaker, FNP

## 2018-10-18 NOTE — Telephone Encounter (Addendum)
Called [facility] to further follow up on the PA and the reason why Esbriet was denied. Spoke with Ovid Curd and per Ovid Curd, the PA was denied as it stated that the med was going to be used in combination with OFEV. I stated to Ovid Curd that pt will only be doing Esbriet and will not be on OFEV. Pt will be doing only the one med.  Per Ovid Curd, he could not fix anything on his end with the decision and this had to go through [employer] which he stated he would transfer me to. Phone number of medicare appeals dept is [phone]. Was transferred to appeals dept and spoke with Mickel Baas to see if I could do appeal over the phone to get pt's medication approved.   Per Mickel Baas, the question on the PA that got the PA denied was: Is the Esbriet not being used in combination with the OFEV. This was answered as no, when it should have been answered as yes meaning that the pt is only going to be doing the [place]. I stated to Mickel Baas more than once that pt is only going to be doing the Esbriet medication.  Mickel Baas stated to me since pt has medicare part D insurance, we had to fill out the appeal form and then send it back in but this could be done in an urgent matter. Will fill out form and get it faxed back to appeals dept.  Routing back to [place] with info that I found out.

## 2018-10-19 ENCOUNTER — Telehealth: Payer: Self-pay | Admitting: Pulmonary Disease

## 2018-10-19 NOTE — Telephone Encounter (Signed)
[place] and spoke with Lam stating to him that appeal for pt's Esbriet was approved by [facility] through 07/18/2019. Lam stated he would send this over to their claims dept to further investigate so that way they can be able to get med available to be shipped to pt.  Called and spoke with pt and spouse Herbie [place] stating this info to them and they both expressed understanding. Robert then stated to me that pt is going to be getting med through [facility] patient assistance pharmacy due to pt originally going to have a high copay through [employer].  Called [facility] with [facility] and spoke with Jet in regards to a form that was received stating that enrollment for pt in [place] was incomplete in regards to enrollment and asked Jet if this still needed to be completed as I had spoken with pt's husband who stated he had been speaking with [employer] stating that all should be almost finalized for pt to be able to receive Esbriet. Per Jet, they have all the info they are needing so the form can be disregarded that we received. Jet stated to me that they should have everything finalized with hope of shipping med next week to pt.  Routing to [place] as an Pharmacist, hospital.

## 2018-10-19 NOTE — Telephone Encounter (Signed)
Thank you for your work and update.   [name]

## 2018-10-19 NOTE — Telephone Encounter (Signed)
I called and spoke with pt in regards to this and stated to her and her husband if they receive another phone call from [facility] to tell them not to send med since they are going to be getting med from [facility]. Pt's husband [name] stated he had spoken with the pharmacy and told them not to send the med and I stated to him that they would not send med without having consent from them to ship it.  I also spoke with [facility]'s pharmacy and all should be finalized for pt to receive med from them by next week. Nothing further needed.

## 2018-10-19 NOTE — Telephone Encounter (Signed)
[facility] calling to states PA for Esbriet has been approved through Dec. 31, 2020. Cb is [phone]

## 2018-10-19 NOTE — Telephone Encounter (Signed)
Called to check on status of appeal, Per [facility], appeal is still pending. Should have a determination by the end of the day.

## 2018-10-20 ENCOUNTER — Other Ambulatory Visit: Payer: Self-pay | Admitting: Internal Medicine

## 2018-10-20 DIAGNOSIS — E785 Hyperlipidemia, unspecified: Secondary | ICD-10-CM

## 2018-10-20 DIAGNOSIS — I2510 Atherosclerotic heart disease of native coronary artery without angina pectoris: Secondary | ICD-10-CM

## 2018-10-23 ENCOUNTER — Ambulatory Visit (HOSPITAL_COMMUNITY): Payer: Medicare Other

## 2018-10-23 ENCOUNTER — Telehealth: Payer: Self-pay | Admitting: Internal Medicine

## 2018-10-23 MED ORDER — DOXYCYCLINE HYCLATE 100 MG PO TABS: 100.0000 mg | ORAL_TABLET | Freq: Two times a day (BID) | ORAL | 0 refills | Status: AC

## 2018-10-23 MED ORDER — PREDNISONE 10 MG PO TABS: ORAL_TABLET | ORAL | 0 refills | Status: DC

## 2018-10-23 NOTE — Telephone Encounter (Signed)
Call made to patient, made aware of BM recommendations. Confirmed pharmacy. Medications sent in. Made aware if symptoms do not improve to present to ER or Urgent care. Voiced understanding. Nothing further is needed at this time.

## 2018-10-23 NOTE — Telephone Encounter (Signed)
Make sure patient is adhearent to allergy meds and is doing nasal saline rinses.   Can offer:   Doxycycline >>> 1 100 mg tablet every 12 hours for 7 days >>>take with food  >>>wear sunscreen   Prednisone 74m tablet  >>>4 tabs for 2 days, then 3 tabs for 2 days, 2 tabs for 2 days, then 1 tab for 2 days, then stop >>>take with food  >>>take in the morning    Place orders.   If patients symptoms worsen she will need to present to emergency room or urgent care.   BAaron Edelman

## 2018-10-23 NOTE — Telephone Encounter (Signed)
Primary Pulmonologist: MR Last office visit and with whom:10/01/18 BM What do we see them for (pulmonary problems): ILD/OSA  Reason for call: Running nose, chest congestion, coughing up yellow and green mucus, She does not have a tempeture. She is wheezing.  She has tried her Duoneb and Mucinex to help this with little relief. In the last month, have you been in contact with someone who was confirmed or suspected to have Conoravirus / COVID-19?  No   Do you have any of the following symptoms developed in the last 30 days? Fever: No  Cough: yes coughing up yellow to green mucus  Shortness of breath: Yes/ wheezing   When did your symptoms start?  10/20/18  If the patient has a fever, what is the last reading?  (use n/a if patient denies fever)  N?A . IF THE PATIENT STATES THEY DO NOT OWN A THERMOMETER, THEY MUST GO AND PURCHASE ONE When did the fever start?: N/a Have you taken any medication to suppress a fever (ie Ibuprofen, Aleve, Tylenol)?: Advil   TRIAGE :: REMIND PATIENT TO SELF-ISOLATE WHILE THEY'RE MESSAGE IS BEING HANDLED  BM Please advise

## 2018-10-25 ENCOUNTER — Ambulatory Visit (HOSPITAL_COMMUNITY): Payer: Medicare Other

## 2018-10-25 ENCOUNTER — Telehealth: Payer: Self-pay | Admitting: Internal Medicine

## 2018-10-25 NOTE — Telephone Encounter (Signed)
Attempted to call Shirlean Mylar with [employer] at phone [phone] today regarding if pt needs titration or not. I did not receive an answer at time of call. I have left a voicemail message for pt to return call. X1

## 2018-10-25 NOTE — Telephone Encounter (Signed)
Fax received from [place] dated 10/17/18 stating the patient has been approved for [place] free of charge.That status remains unless:  Therapy is discontinued, Patient health insurance or financial status changes or Patient no longer meets the program eligibility requirements.  If those changes occur, [facility] needs to be contacted immediately.  [place] [phone].  If there are issues related to shipment of medication call their pharmacy vendor [facility] [phone].  Copy of letter sent to medical records to be scanned into the chart.

## 2018-10-30 ENCOUNTER — Telehealth (HOSPITAL_COMMUNITY): Payer: Self-pay | Admitting: *Deleted

## 2018-10-30 ENCOUNTER — Telehealth: Payer: Self-pay | Admitting: Internal Medicine

## 2018-10-30 ENCOUNTER — Ambulatory Visit (HOSPITAL_COMMUNITY): Payer: Medicare Other

## 2018-10-30 NOTE — Addendum Note (Signed)
Encounter addended by: Lance Morin, RN on: 10/30/2018 10:53 AM  Actions taken: Clinical Note Signed

## 2018-10-30 NOTE — Telephone Encounter (Signed)
Nena Polio, [place]       10/25/18 2:34 PM  Note    Fax received from [place] dated 10/17/18 stating the patient has been approved for Esbriet free of charge.That status remains unless:  Therapy is discontinued, Patient health insurance or financial status changes or Patient no longer meets the program eligibility requirements.  If those changes occur, [facility] needs to be contacted immediately.  [place] [phone].  If there are issues related to shipment of medication call their pharmacy vendor [facility] [phone].  Copy of letter sent to medical records to be scanned into the chart.     [place] with [employer] and spoke with Santiago Glad who stated a clarification was needed for pt's Esbriet medication. Per Santiago Glad, they need to know if pt's med will be capsules or tablets and if pt is needing to do titration on medication. I stated to Santiago Glad that pt is needing to do titration due to it being a new start for her. Santiago Glad expressed understanding and stated she would transfer me to pharmacist so I can give them all the necessary info. Spoke with Jenny Reichmann, pharmacist verifying all needed info so they can be able to get med shipped to pt. Nothing further needed.

## 2018-10-30 NOTE — Progress Notes (Signed)
After [name]'s 6 minute walk test she complained of right hip pain and after 2-3 weeks she went to see her orthopedic MD and was found to have a right hip stress fracture.  She is being discharged from pulmonary rehab d/t time needed for fx to heal.  Pulmonary rehab is closed indefinitely d/t COVID-19.  She was instructed when she is cleared to begin exercise by her orthopedic MD to ask [name] for another referral to start pulmonary rehab.  She is practicing social distancing, wearing a mask when out and washing her hands often and not touching her face or eyes.

## 2018-11-01 ENCOUNTER — Ambulatory Visit (HOSPITAL_COMMUNITY): Payer: Medicare Other

## 2018-11-06 ENCOUNTER — Ambulatory Visit (HOSPITAL_COMMUNITY): Payer: Medicare Other

## 2018-11-08 ENCOUNTER — Ambulatory Visit (HOSPITAL_COMMUNITY): Payer: Medicare Other

## 2018-11-13 ENCOUNTER — Ambulatory Visit (HOSPITAL_COMMUNITY): Payer: Medicare Other

## 2018-11-15 ENCOUNTER — Ambulatory Visit (HOSPITAL_COMMUNITY): Payer: Medicare Other

## 2018-11-15 ENCOUNTER — Other Ambulatory Visit: Payer: Self-pay | Admitting: Internal Medicine

## 2018-11-19 ENCOUNTER — Other Ambulatory Visit: Payer: Self-pay | Admitting: Internal Medicine

## 2018-11-19 MED ORDER — IPRATROPIUM-ALBUTEROL 0.5-2.5 (3) MG/3ML IN SOLN: RESPIRATORY_TRACT | 12 refills | Status: DC

## 2018-11-19 NOTE — Progress Notes (Signed)
Ipra- alb neb solution script refill [facility]sent to [facility] [address]

## 2018-11-20 ENCOUNTER — Ambulatory Visit (HOSPITAL_COMMUNITY): Payer: Medicare Other

## 2018-11-21 NOTE — Progress Notes (Signed)
Virtual Visit via Telephone Note   This visit type was conducted due to national recommendations for restrictions regarding the COVID-19 Pandemic (e.g. social distancing) in an effort to limit this patient's exposure and mitigate transmission in our community.  Due to her co-morbid illnesses, this patient is at least at moderate risk for complications without adequate follow up.  This format is felt to be most appropriate for this patient at this time.  The patient did not have access to video technology/had technical difficulties with video requiring transitioning to audio format only (telephone).  All issues noted in this document were discussed and addressed.  No physical exam could be performed with this format.  Please refer to the patient's chart for her  consent to telehealth for [facility].   Date:  11/23/2018   ID:  [name], DOB 1946-02-22, MRN [id number]  Patient Location: Home Provider Location: Office  PCP:  Leonard Downing, MD  Cardiologist:  Nelva Bush, MD  Electrophysiologist:  None   Evaluation Performed:  Follow-Up Visit  Chief Complaint:  Cough and shortness of breath  History of Present Illness:    [name] is a 73 y.o. female with history of nonobstructive coronary artery disease, asthmatic bronchitis, GERD, rheumatoid arthritis, CVA, prediabetes, IBS, and allergic rhinitis.  I spoke with her in late March, at which time she reported worsening shortness of breath in the setting of interstitial lung disease.  Referral to the advanced heart failure clinic was pending (initiated by [name]) for evaluation of concurrent pulmonary hypertension.  Today, [name] reports that she is feeling worse than when we last spoke ~6 weeks ago.  Her cough has worsened despite several courses of antibiotics (she is on day 7 of Augmentin, having previously received doxycycline).  The cough remains productive with thick sputum.  She has stopped using her chest  percussion vest, because she felt like her cough got worse.  She notes that her chest now feels more congested, making it hard for her to breath.  She denies chest pain and palpitations.  Her edema has been stable, though [name] remains concerned about continued weight gain.  [name] was recently diagnosed with a stress fracture involving the right hip.  This has become progressively more painful and makes it difficult for her to walk from her bed to the bathroom.  She is now using a bedside commode.  Despite this, she is often unable to reach the bathroom in time to void.  Therefore, she has skipped some doses of furosemide.  She has chronic orthopnea, sleeping at ~30 degrees.  She denies fevers, chills, and COVID-19 contacts.  However, given her progressive cough and shortness of breath, she is concerned that she may have it.  The patient does have symptoms concerning for COVID-19 infection (fever, chills, cough, or new shortness of breath).    Past Medical History:  Diagnosis Date   Acute asthmatic bronchitis    Allergic rhinitis    Esophageal reflux    Irritable bowel syndrome    Rheumatoid arthritis(714.0)    Past Surgical History:  Procedure Laterality Date   ABDOMINAL HYSTERECTOMY     ANKLE FUSION  2009   left   BACK SURGERY     CHOLECYSTECTOMY     RIGHT/LEFT HEART CATH AND CORONARY ANGIOGRAPHY N/A 09/16/2016   Procedure: Right/Left Heart Cath and Coronary Angiography;  Surgeon: Nelva Bush, MD;  Location: [facility];  Service: Cardiovascular;  Laterality: N/A;  TOTAL KNEE ARTHROPLASTY Bilateral      Current Meds  Medication Sig   ALPRAZolam (XANAX) 0.5 MG tablet Take 0.5 mg by mouth as needed.   amoxicillin-clavulanate (AUGMENTIN) 875-125 MG tablet Take 1 tablet by mouth 2 (two) times daily.   ANORO ELLIPTA 62.5-25 MCG/INH AEPB USE 1 INHALATION DAILY   aspirin EC 81 MG tablet Take 81 mg by mouth daily.   benzonatate (TESSALON) 200 MG capsule TAKE  1 CAPSULE BY MOUTH 3 TIMES A DAY AS NEEDED FOR COUGH   cetirizine (ZYRTEC) 10 MG tablet Take 10 mg by mouth daily.   clidinium-chlordiazePOXIDE (LIBRAX) 5-2.5 MG capsule Take 1 capsule by mouth 2 (two) times daily as needed (for IBS).   colestipol (COLESTID) 1 G tablet Take 1 g by mouth 2 (two) times daily.    CV[employer] D3 5000 units capsule Take 5,000 Units by mouth daily.   Dextromethorphan-guaiFENesin (MUCINEX DM PO) Take by mouth 2 (two) times daily as needed.   FLUoxetine (PROZAC) 20 MG capsule Take 20 mg by mouth daily.   fluticasone (FLONASE) 50 MCG/ACT nasal spray Place 2 sprays into the nose daily.    furosemide (LASIX) 40 MG tablet TAKE 1 TABLET BY MOUTH TWO  TIMES DAILY   glycopyrrolate (ROBINUL) 1 MG tablet Take 1 mg by mouth 3 (three) times daily as needed (for IBS).    hydroxychloroquine (PLAQUENIL) 200 MG tablet Take 400 mg by mouth daily.    hydroxypropyl methylcellulose / hypromellose (ISOPTO TEARS / GONIOVISC) 2.5 % ophthalmic solution Place 1-2 drops into both eyes 3 (three) times daily as needed for dry eyes.   ibuprofen (ADVIL,MOTRIN) 400 MG tablet Take 400 mg by mouth as needed.   ipratropium (ATROVENT) 0.03 % nasal spray USE 1 TO 2 SPRAYS INTO BOTH NOSTRILS 2 TIMES DAILY.   ipratropium-albuterol (DUONEB) 0.5-2.5 (3) MG/3ML SOLN Use 1 vial in nebulizer every 6 hours as needed   leflunomide (ARAVA) 20 MG tablet Take 20 mg by mouth daily.    mirtazapine (REMERON) 45 MG tablet Take 45 mg by mouth at bedtime.   montelukast (SINGULAIR) 10 MG tablet TAKE 1 TABLET BY MOUTH AT  BEDTIME   Multiple Vitamin (MULTIVITAMIN) tablet Take 1 tablet by mouth daily.   omeprazole (PRILOSEC) 40 MG capsule Take 40 mg by mouth daily.   Pirfenidone (ESBRIET) 267 MG TABS Take by mouth as directed.   potassium chloride SA (K-DUR,KLOR-CON) 20 MEQ tablet TAKE 1 TABLET BY MOUTH 3  TIMES DAILY (Patient taking differently: Take 20 mEq by mouth 2 (two) times daily. )   PROAIR HFA 108 (90  Base) MCG/ACT inhaler USE 2 PUFFS BY MOUTH EVERY  6 HOURS AS NEEDED   rosuvastatin (CRESTOR) 20 MG tablet TAKE 1 TABLET BY MOUTH  DAILY   traMADol (ULTRAM) 50 MG tablet Take 50 mg by mouth 2 (two) times daily.    verapamil (CALAN-SR) 240 MG CR tablet Take 240 mg by mouth daily.     Allergies:   Cefdinir; Erythromycin base; Lactose intolerance (gi); Lactulose; Mesalamine; Nitrofuran derivatives; Nitrofurantoin; Other; Sulfa antibiotics; Tdap [tetanus-diphth-acell pertussis]; and Tetanus toxoid, adsorbed   Social History   Tobacco Use   Smoking status: Never Smoker   Smokeless tobacco: Never Used   Tobacco comment: positive passive tobacco smoke exposure  Substance Use Topics   Alcohol use: Yes    Comment: glass of wine each night    Drug use: No     Family Hx: The patient's family history includes Arthritis in her mother;  Diabetes in an other family member; Heart attack in her father and another family member; Heart disease in her mother.  ROS:   Please see the history of present illness.   All other systems reviewed and are negative.   Prior CV studies:   The following studies were reviewed today:  ABI's (07/30/18): 1.0 on the right and 1.1 on the left.  TBI's 0.62 on the right and 0.61 on the left.  Findings are normal.  L/RHC (09/16/16): LMCA normal. LAD with 50% midvessel stenosis. LCx normal. 20% proximal RCA. LVEDP 15. RA 13, RV 38/12, PA 43/23 (32), PCWP 16. Ventricular concordance noted. Fick CO/CI 6.4/3.7. PA sat 70%, RA sat 75%, Ao sat 94%. PVR 2.5 Wood Units.  Carotid Doppler (10/26/16):Heterogeneous plaque, bilaterally. Tortuous mid and distal ICA's, bilaterally. 1-39% bilateral ICA stenosis. Normal subclavian arteries, bilaterally. Patent vertebral arteries with antegrade flow.Subcentimeter hypoechoic nodule in the thyroid.  Transthoracic echocardiogram (05/11/16): Normal LV size and in traction (EF 55-60%). Normal LV wall motion. Grade 1 diastolic. Normal RV  size and function. Mild mitral valve thickening. Mild TR.  Pharmacologic myocardial perfusion stress test (05/11/16): Low risk study without ischemia or scar. LVEF 62%.  Labs/Other Tests and Data Reviewed:    EKG:  No ECG reviewed.  Recent Labs: 07/31/2018: BUN 17; Creatinine, Ser 1.02; Hemoglobin 13.0; Platelets 272.0; Potassium 4.4; Pro B Natriuretic peptide (BNP) 38.0; Sodium 138   Recent Lipid Panel Lab Results  Component Value Date/Time   CHOL 127 01/09/2017 10:24 AM   TRIG 181 (H) 01/09/2017 10:24 AM   HDL 53 01/09/2017 10:24 AM   CHOLHDL 2.4 01/09/2017 10:24 AM   LDLCALC 38 01/09/2017 10:24 AM   LDLDIRECT 53 01/09/2017 10:24 AM    Wt Readings from Last 3 Encounters:  11/23/18 200 lb (90.7 kg)  10/12/18 199 lb (90.3 kg)  10/01/18 201 lb 12.8 oz (91.5 kg)     Objective:    Vital Signs:  BP 133/74 (BP Location: Left Arm, Patient Position: Sitting, Cuff Size: Normal)    Pulse 83    Ht _0  (1.549 m)    Wt 200 lb (90.7 kg)    SpO2 95%    BMI 37.79 kg/m    VITAL SIGNS:  reviewed  ASSESSMENT & PLAN:    Chronic HFpEF, shortness of breath, and cough I remain concerned that [name]'s worsening productive cough and shortness of breath are related to underlying lung disease.  She reports stable edema; her weight is also stable based on our records.  While her symptoms may have a component of HFpEF and/or pulmonary hypertension, I do not believe that they are the driving forces.  We discussed further escalation of furosemide but have agreed to defer this.  Advanced Heart Failure consultation is still pending.  I will reach out to [name] to discuss other possible treatment options for [name], as well as the feasibility of in-person evaluation in the pulmonary clinic.  Given gradual progression of symptoms and lack of COVID-19 exposure, I have a low suspicion that COVID-19 is the cause of her symptoms.  However, if her symptoms continue to worsen, I advised her that she may  need to go to the ED for further evaluation.  Nonobstructive coronary artery disease. No chest pain.  Continue current medications to prevent progression of moderate CAD noted on catheterization in 2018.  Pulmonary fibrosis: Patient on Esbriet, currently titrating up to target dose.  Continue management per [name].  COVID-19 Education: The signs and  symptoms of COVID-19 were discussed with the patient and how to seek care for testing (follow up with PCP or arrange E-visit).  The importance of social distancing was discussed today.  Time:   Today, I have spent 15 minutes with the patient with telehealth technology discussing the above problems.     Medication Adjustments/Labs and Tests Ordered: Current medicines are reviewed at length with the patient today.  Concerns regarding medicines are outlined above.   Tests Ordered: No orders of the defined types were placed in this encounter.   Medication Changes: No orders of the defined types were placed in this encounter.   Disposition:  Follow up in 3-4 months.  Signed, Nelva Bush, MD  11/23/2018 10:28 AM    [place]

## 2018-11-22 ENCOUNTER — Ambulatory Visit (HOSPITAL_COMMUNITY): Payer: Medicare Other

## 2018-11-23 ENCOUNTER — Encounter: Payer: Self-pay | Admitting: Internal Medicine

## 2018-11-23 ENCOUNTER — Telehealth (INDEPENDENT_AMBULATORY_CARE_PROVIDER_SITE_OTHER): Payer: Medicare Other | Admitting: Internal Medicine

## 2018-11-23 ENCOUNTER — Other Ambulatory Visit: Payer: Self-pay

## 2018-11-23 VITALS — BP 133/74 | HR 83 | Ht 61.0 in | Wt 200.0 lb

## 2018-11-23 DIAGNOSIS — I2510 Atherosclerotic heart disease of native coronary artery without angina pectoris: Secondary | ICD-10-CM | POA: Diagnosis not present

## 2018-11-23 DIAGNOSIS — R05 Cough: Secondary | ICD-10-CM | POA: Diagnosis not present

## 2018-11-23 DIAGNOSIS — I5032 Chronic diastolic (congestive) heart failure: Secondary | ICD-10-CM

## 2018-11-23 DIAGNOSIS — J8410 Pulmonary fibrosis, unspecified: Secondary | ICD-10-CM

## 2018-11-23 DIAGNOSIS — R059 Cough, unspecified: Secondary | ICD-10-CM

## 2018-11-23 DIAGNOSIS — R0602 Shortness of breath: Secondary | ICD-10-CM

## 2018-11-23 MED ORDER — VERAPAMIL HCL ER 240 MG PO TBCR: 240.0000 mg | EXTENDED_RELEASE_TABLET | Freq: Every day | ORAL | 1 refills | Status: DC

## 2018-11-23 MED ORDER — ROSUVASTATIN CALCIUM 20 MG PO TABS: 20.0000 mg | ORAL_TABLET | Freq: Every day | ORAL | 1 refills | Status: DC

## 2018-11-23 MED ORDER — FUROSEMIDE 40 MG PO TABS: 40.0000 mg | ORAL_TABLET | Freq: Two times a day (BID) | ORAL | 1 refills | Status: DC

## 2018-11-23 MED ORDER — POTASSIUM CHLORIDE CRYS ER 20 MEQ PO TBCR: 20.0000 meq | EXTENDED_RELEASE_TABLET | Freq: Two times a day (BID) | ORAL | 1 refills | Status: DC

## 2018-11-23 NOTE — Patient Instructions (Signed)
Medication Instructions:  Your physician recommends that you continue on your current medications as directed. Please refer to the Current Medication list given to you today.  If you need a refill on your cardiac medications before your next appointment, please call your pharmacy.   Lab work: none If you have labs (blood work) drawn today and your tests are completely normal, you will receive your results only by: Marland Kitchen MyChart Message (if you have MyChart) OR . A paper copy in the mail If you have any lab test that is abnormal or we need to change your treatment, we will call you to review the results.  Testing/Procedures: none  Follow-Up: At [facility], you and your health needs are our priority.  As part of our continuing mission to provide you with exceptional heart care, we have created designated Provider Care Teams.  These Care Teams include your primary Cardiologist (physician) and Advanced Practice Providers (APPs -  Physician Assistants and Nurse Practitioners) who all work together to provide you with the care you need, when you need it. You will need a follow up appointment in 3-4 months.  Please call our office 2 months in advance to schedule this appointment.  You may see Nelva Bush, MD or one of the following Advanced Practice Providers on your designated Care Team:   Murray Hodgkins, NP Christell Faith, PA-C . Marrianne Mood, PA-C

## 2018-11-27 ENCOUNTER — Ambulatory Visit (HOSPITAL_COMMUNITY): Payer: Medicare Other

## 2018-11-29 ENCOUNTER — Ambulatory Visit (HOSPITAL_COMMUNITY): Payer: Medicare Other

## 2018-12-04 ENCOUNTER — Ambulatory Visit (HOSPITAL_COMMUNITY): Payer: Medicare Other

## 2018-12-06 ENCOUNTER — Ambulatory Visit (HOSPITAL_COMMUNITY): Payer: Medicare Other

## 2018-12-10 ENCOUNTER — Other Ambulatory Visit: Payer: Self-pay | Admitting: Internal Medicine

## 2018-12-11 ENCOUNTER — Ambulatory Visit (HOSPITAL_COMMUNITY): Payer: Medicare Other

## 2018-12-11 NOTE — Telephone Encounter (Signed)
Is this appropriate for refill ? 

## 2018-12-12 ENCOUNTER — Other Ambulatory Visit: Payer: Self-pay | Admitting: Internal Medicine

## 2018-12-13 ENCOUNTER — Ambulatory Visit (HOSPITAL_COMMUNITY): Payer: Medicare Other

## 2018-12-14 ENCOUNTER — Other Ambulatory Visit: Payer: Self-pay | Admitting: Internal Medicine

## 2018-12-14 ENCOUNTER — Other Ambulatory Visit: Payer: Self-pay | Admitting: Orthopedic Surgery

## 2018-12-14 DIAGNOSIS — M25551 Pain in right hip: Secondary | ICD-10-CM

## 2018-12-17 ENCOUNTER — Encounter (HOSPITAL_COMMUNITY)
Admission: RE | Admit: 2018-12-17 | Discharge: 2018-12-17 | Disposition: A | Discharge status: Home / Self Care | Payer: Medicare Other | Source: Ambulatory Visit | Attending: Orthopedic Surgery | Admitting: Orthopedic Surgery

## 2018-12-17 ENCOUNTER — Ambulatory Visit
Admission: RE | Admit: 2018-12-17 | Discharge: 2018-12-17 | Disposition: A | Discharge status: Home / Self Care | Payer: Medicare Other | Source: Ambulatory Visit | Attending: Orthopedic Surgery | Admitting: Orthopedic Surgery

## 2018-12-17 ENCOUNTER — Other Ambulatory Visit (HOSPITAL_COMMUNITY)
Admission: RE | Admit: 2018-12-17 | Discharge: 2018-12-17 | Disposition: A | Discharge status: Home / Self Care | Payer: Medicare Other | Source: Ambulatory Visit | Attending: Orthopedic Surgery | Admitting: Orthopedic Surgery

## 2018-12-17 ENCOUNTER — Other Ambulatory Visit (HOSPITAL_COMMUNITY): Payer: Self-pay | Admitting: *Deleted

## 2018-12-17 ENCOUNTER — Telehealth: Payer: Self-pay

## 2018-12-17 ENCOUNTER — Encounter (HOSPITAL_COMMUNITY): Payer: Self-pay

## 2018-12-17 ENCOUNTER — Other Ambulatory Visit: Payer: Self-pay

## 2018-12-17 DIAGNOSIS — J8410 Pulmonary fibrosis, unspecified: Secondary | ICD-10-CM | POA: Diagnosis not present

## 2018-12-17 DIAGNOSIS — M25551 Pain in right hip: Secondary | ICD-10-CM

## 2018-12-17 DIAGNOSIS — I272 Pulmonary hypertension, unspecified: Secondary | ICD-10-CM | POA: Insufficient documentation

## 2018-12-17 DIAGNOSIS — Z96653 Presence of artificial knee joint, bilateral: Secondary | ICD-10-CM | POA: Insufficient documentation

## 2018-12-17 DIAGNOSIS — Z9049 Acquired absence of other specified parts of digestive tract: Secondary | ICD-10-CM | POA: Insufficient documentation

## 2018-12-17 DIAGNOSIS — F419 Anxiety disorder, unspecified: Secondary | ICD-10-CM | POA: Insufficient documentation

## 2018-12-17 DIAGNOSIS — Z01818 Encounter for other preprocedural examination: Secondary | ICD-10-CM | POA: Insufficient documentation

## 2018-12-17 DIAGNOSIS — E669 Obesity, unspecified: Secondary | ICD-10-CM | POA: Diagnosis not present

## 2018-12-17 DIAGNOSIS — Z7982 Long term (current) use of aspirin: Secondary | ICD-10-CM | POA: Diagnosis not present

## 2018-12-17 DIAGNOSIS — K219 Gastro-esophageal reflux disease without esophagitis: Secondary | ICD-10-CM | POA: Insufficient documentation

## 2018-12-17 DIAGNOSIS — M069 Rheumatoid arthritis, unspecified: Secondary | ICD-10-CM | POA: Insufficient documentation

## 2018-12-17 DIAGNOSIS — I5032 Chronic diastolic (congestive) heart failure: Secondary | ICD-10-CM | POA: Diagnosis not present

## 2018-12-17 DIAGNOSIS — G4730 Sleep apnea, unspecified: Secondary | ICD-10-CM | POA: Insufficient documentation

## 2018-12-17 DIAGNOSIS — Z7951 Long term (current) use of inhaled steroids: Secondary | ICD-10-CM | POA: Diagnosis not present

## 2018-12-17 DIAGNOSIS — Z9981 Dependence on supplemental oxygen: Secondary | ICD-10-CM | POA: Insufficient documentation

## 2018-12-17 DIAGNOSIS — I110 Hypertensive heart disease with heart failure: Secondary | ICD-10-CM | POA: Diagnosis not present

## 2018-12-17 DIAGNOSIS — Z791 Long term (current) use of non-steroidal anti-inflammatories (NSAID): Secondary | ICD-10-CM | POA: Insufficient documentation

## 2018-12-17 DIAGNOSIS — Z1159 Encounter for screening for other viral diseases: Secondary | ICD-10-CM | POA: Diagnosis not present

## 2018-12-17 DIAGNOSIS — E785 Hyperlipidemia, unspecified: Secondary | ICD-10-CM | POA: Diagnosis not present

## 2018-12-17 DIAGNOSIS — I2510 Atherosclerotic heart disease of native coronary artery without angina pectoris: Secondary | ICD-10-CM | POA: Insufficient documentation

## 2018-12-17 DIAGNOSIS — M1611 Unilateral primary osteoarthritis, right hip: Secondary | ICD-10-CM | POA: Diagnosis present

## 2018-12-17 DIAGNOSIS — J449 Chronic obstructive pulmonary disease, unspecified: Secondary | ICD-10-CM | POA: Diagnosis not present

## 2018-12-17 DIAGNOSIS — K589 Irritable bowel syndrome without diarrhea: Secondary | ICD-10-CM | POA: Diagnosis not present

## 2018-12-17 DIAGNOSIS — I10 Essential (primary) hypertension: Secondary | ICD-10-CM | POA: Insufficient documentation

## 2018-12-17 DIAGNOSIS — Z6839 Body mass index (BMI) 39.0-39.9, adult: Secondary | ICD-10-CM | POA: Diagnosis not present

## 2018-12-17 DIAGNOSIS — Z79899 Other long term (current) drug therapy: Secondary | ICD-10-CM | POA: Diagnosis not present

## 2018-12-17 HISTORY — DX: Essential (primary) hypertension: I10

## 2018-12-17 HISTORY — DX: Dependence on supplemental oxygen: Z99.81

## 2018-12-17 HISTORY — DX: Sleep apnea, unspecified: G47.30

## 2018-12-17 HISTORY — DX: Anxiety disorder, unspecified: F41.9

## 2018-12-17 HISTORY — DX: Interstitial pulmonary disease, unspecified: J84.9

## 2018-12-17 HISTORY — DX: Other specified postprocedural states: Z98.890

## 2018-12-17 HISTORY — DX: Other specified postprocedural states: R11.2

## 2018-12-17 HISTORY — DX: Unspecified osteoarthritis, unspecified site: M19.90

## 2018-12-17 LAB — CBC WITH DIFFERENTIAL/PLATELET
Abs Immature Granulocytes: 0.1 10*3/uL — ABNORMAL HIGH (ref 0.00–0.07)
Basophils Absolute: 0 10*3/uL (ref 0.0–0.1)
Basophils Relative: 0 %
Eosinophils Absolute: 0.1 10*3/uL (ref 0.0–0.5)
Eosinophils Relative: 1 %
HCT: 42.7 % (ref 36.0–46.0)
Hemoglobin: 12.9 g/dL (ref 12.0–15.0)
Immature Granulocytes: 1 %
Lymphocytes Relative: 9 %
Lymphs Abs: 1 10*3/uL (ref 0.7–4.0)
MCH: 30.1 pg (ref 26.0–34.0)
MCHC: 30.2 g/dL (ref 30.0–36.0)
MCV: 99.8 fL (ref 80.0–100.0)
Monocytes Absolute: 0.9 10*3/uL (ref 0.1–1.0)
Monocytes Relative: 8 %
Neutro Abs: 9.2 10*3/uL — ABNORMAL HIGH (ref 1.7–7.7)
Neutrophils Relative %: 81 %
Platelets: 279 10*3/uL (ref 150–400)
RBC: 4.28 MIL/uL (ref 3.87–5.11)
RDW: 14.7 % (ref 11.5–15.5)
WBC: 11.3 10*3/uL — ABNORMAL HIGH (ref 4.0–10.5)
nRBC: 0 % (ref 0.0–0.2)

## 2018-12-17 LAB — BASIC METABOLIC PANEL
Anion gap: 8 (ref 5–15)
BUN: 25 mg/dL — ABNORMAL HIGH (ref 8–23)
CO2: 27 mmol/L (ref 22–32)
Calcium: 9.6 mg/dL (ref 8.9–10.3)
Chloride: 105 mmol/L (ref 98–111)
Creatinine, Ser: 0.83 mg/dL (ref 0.44–1.00)
GFR calc Af Amer: >60 mL/min (ref >60)
GFR calc non Af Amer: >60 mL/min (ref >60)
Glucose, Bld: 126 mg/dL — ABNORMAL HIGH (ref 70–99)
Potassium: 3.9 mmol/L (ref 3.5–5.1)
Sodium: 140 mmol/L (ref 135–145)

## 2018-12-17 LAB — SURGICAL PCR SCREEN
MRSA, PCR: NEGATIVE
Staphylococcus aureus: NEGATIVE

## 2018-12-17 LAB — PROTIME-INR
INR: 1 (ref 0.8–1.2)
Prothrombin Time: 12.6 seconds (ref 11.4–15.2)

## 2018-12-17 NOTE — Patient Instructions (Addendum)
[name]    Your procedure is scheduled on: 12-20-2018  Report to [facility] Main  Entrance  Report to admitting at [place] 19 TEST ON 12-17-18 @ 300 pm THIS TEST MUST BE DONE BEFORE SURGERY, COME TO Center.   Bring cpap mask and tubing  Call this number if you have problems the morning of surgery [phone]    Remember:  [place], NO CHEWING GUM CANDY OR MINTS.  NO SOLID FOOD AFTER MIDNIGHT THE NIGHT PRIOR TO SURGERY. NOTHING BY MOUTH EXCEPT CLEAR LIQUIDS UNTIL 430 AM.  DRINK PRESURGERY ENSURE DRINK AT 430 AM.    CLEAR LIQUID DIET   Foods Allowed                                                                     Foods Excluded  Coffee and tea, regular and decaf                             liquids that you cannot  Plain Jell-O in any flavor                                             see through such as: Fruit ices (not with fruit pulp)                                     milk, soups, orange juice  Iced Popsicles                                    All solid food Carbonated beverages, regular and diet                                    Cranberry, grape and apple juices Sports drinks like Gatorade Lightly seasoned clear broth or consume(fat free) Sugar, honey syrup  Sample Menu Breakfast                                Lunch                                     Supper Cranberry juice                    Beef broth                            Chicken broth Jell-O  Grape juice                           Apple juice Coffee or tea                        Jell-O                                      Popsicle                                                Coffee or tea                        Coffee or tea  _____________________________________________________________________    Take these medicines the  morning of surgery with A SIP OF WATER: ALPRAZOLAM, ANNORO ELLIPTA INHALER, ZYRTEC, FLUOXERINE, FLONASE , ATORVENT, DUONEB NEBULIZER, PROAIR INHALER IF NEEDED AND BRING INHALER, ROSUVASTATIN, VERAPAMIL                              You may not have any metal on your body including hair pins and              piercings  Do not wear jewelry, make-up, lotions, powders or perfumes, deodorant             Do not wear nail polish.  Do not shave  48 hours prior to surgery.              Men may shave face and neck.   Do not bring valuables to the hospital. [place].  Contacts, dentures or bridgework may not be worn into surgery.  Leave suitcase in the car. After surgery it may be brought to your room.                  _____________________________________________________________________             [facility] - Preparing for Surgery Before surgery, you can play an important role.  Because skin is not sterile, your skin needs to be as free of germs as possible.  You can reduce the number of germs on your skin by washing with CHG (chlorahexidine gluconate) soap before surgery.  CHG is an antiseptic cleaner which kills germs and bonds with the skin to continue killing germs even after washing. Please DO NOT use if you have an allergy to CHG or antibacterial soaps.  If your skin becomes reddened/irritated stop using the CHG and inform your nurse when you arrive at Short Stay. Do not shave (including legs and underarms) for at least 48 hours prior to the first CHG shower.  You may shave your face/neck. Please follow these instructions carefully:  1.  Shower with CHG Soap the night before surgery and the  morning of Surgery.  2.  If you choose to wash your hair, wash your hair first as usual with your  normal  shampoo.  3.  After you shampoo, rinse your hair and body thoroughly to remove the  shampoo.  4.  Use CHG as you would any  other liquid soap.  You can apply chg directly  to the skin and wash                       Gently with a scrungie or clean washcloth.  5.  Apply the CHG Soap to your body ONLY FROM THE NECK DOWN.   Do not use on face/ open                           Wound or open sores. Avoid contact with eyes, ears mouth and genitals (private parts).                       Wash face,  Genitals (private parts) with your normal soap.             6.  Wash thoroughly, paying special attention to the area where your surgery  will be performed.  7.  Thoroughly rinse your body with warm water from the neck down.  8.  DO NOT shower/wash with your normal soap after using and rinsing off  the CHG Soap.                9.  Pat yourself dry with a clean towel.            10.  Wear clean pajamas.            11.  Place clean sheets on your bed the night of your first shower and do not  sleep with pets. Day of Surgery : Do not apply any lotions/deodorants the morning of surgery.  Please wear clean clothes to the hospital/surgery center.  FAILURE TO FOLLOW THESE INSTRUCTIONS MAY RESULT IN THE CANCELLATION OF YOUR SURGERY PATIENT SIGNATURE_________________________________  NURSE SIGNATURE__________________________________  ________________________________________________________________________   [name]  An incentive spirometer is a tool that can help keep your lungs clear and active. This tool measures how well you are filling your lungs with each breath. Taking long deep breaths may help reverse or decrease the chance of developing breathing (pulmonary) problems (especially infection) following:  A long period of time when you are unable to move or be active. BEFORE THE PROCEDURE   If the spirometer includes an indicator to show your best effort, your nurse or respiratory therapist will set it to a desired goal.  If possible, sit up straight or lean slightly forward. Try not to slouch.  Hold the  incentive spirometer in an upright position. INSTRUCTIONS FOR USE  1. Sit on the edge of your bed if possible, or sit up as far as you can in bed or on a chair. 2. Hold the incentive spirometer in an upright position. 3. Breathe out normally. 4. Place the mouthpiece in your mouth and seal your lips tightly around it. 5. Breathe in slowly and as deeply as possible, raising the piston or the ball toward the top of the column. 6. Hold your breath for 3-5 seconds or for as long as possible. Allow the piston or ball to fall to the bottom of the column. 7. Remove the mouthpiece from your mouth and breathe out normally. 8. Rest for a few seconds and repeat Steps 1 through 7 at least 10 times every 1-2 hours when you are awake. Take your time and take a few normal breaths between deep breaths. 9. The spirometer may include an indicator to  show your best effort. Use the indicator as a goal to work toward during each repetition. 10. After each set of 10 deep breaths, practice coughing to be sure your lungs are clear. If you have an incision (the cut made at the time of surgery), support your incision when coughing by placing a pillow or rolled up towels firmly against it. Once you are able to get out of bed, walk around indoors and cough well. You may stop using the incentive spirometer when instructed by your caregiver.  RISKS AND COMPLICATIONS  Take your time so you do not get dizzy or light-headed.  If you are in pain, you may need to take or ask for pain medication before doing incentive spirometry. It is harder to take a deep breath if you are having pain. AFTER USE  Rest and breathe slowly and easily.  It can be helpful to keep track of a log of your progress. Your caregiver can provide you with a simple table to help with this. If you are using the spirometer at home, follow these instructions: [place] IF:   You are having difficultly using the spirometer.  You have trouble using  the spirometer as often as instructed.  Your pain medication is not giving enough relief while using the spirometer.  You develop fever of 100.5 F (38.1 C) or higher. SEEK IMMEDIATE MEDICAL CARE IF:   You cough up bloody sputum that had not been present before.  You develop fever of 102 F (38.9 C) or greater.  You develop worsening pain at or near the incision site. MAKE SURE YOU:   Understand these instructions.  Will watch your condition.  Will get help right away if you are not doing well or get worse. Document Released: 11/14/2006 Document Revised: 09/26/2011 Document Reviewed: 01/15/2007 ExitCare Patient Information 2014 ExitCare, [place].   ________________________________________________________________________  WHAT IS A BLOOD TRANSFUSION? Blood Transfusion Information  A transfusion is the replacement of blood or some of its parts. Blood is made up of multiple cells which provide different functions.  Red blood cells carry oxygen and are used for blood loss replacement.  White blood cells fight against infection.  Platelets control bleeding.  Plasma helps clot blood.  Other blood products are available for specialized needs, such as hemophilia or other clotting disorders. BEFORE THE TRANSFUSION  Who gives blood for transfusions?   Healthy volunteers who are fully evaluated to make sure their blood is safe. This is blood bank blood. Transfusion therapy is the safest it has ever been in the practice of medicine. Before blood is taken from a donor, a complete history is taken to make sure that person has no history of diseases nor engages in risky social behavior (examples are intravenous drug use or sexual activity with multiple partners). The donor's travel history is screened to minimize risk of transmitting infections, such as malaria. The donated blood is tested for signs of infectious diseases, such as HIV and hepatitis. The blood is then tested to be sure it  is compatible with you in order to minimize the chance of a transfusion reaction. If you or a relative donates blood, this is often done in anticipation of surgery and is not appropriate for emergency situations. It takes many days to process the donated blood. RISKS AND COMPLICATIONS Although transfusion therapy is very safe and saves many lives, the main dangers of transfusion include:   Getting an infectious disease.  Developing a transfusion reaction. This is an allergic reaction  to something in the blood you were given. Every precaution is taken to prevent this. The decision to have a blood transfusion has been considered carefully by your caregiver before blood is given. Blood is not given unless the benefits outweigh the risks. AFTER THE TRANSFUSION  Right after receiving a blood transfusion, you will usually feel much better and more energetic. This is especially true if your red blood cells have gotten low (anemic). The transfusion raises the level of the red blood cells which carry oxygen, and this usually causes an energy increase.  The nurse administering the transfusion will monitor you carefully for complications. HOME CARE INSTRUCTIONS  No special instructions are needed after a transfusion. You may find your energy is better. Speak with your caregiver about any limitations on activity for underlying diseases you may have. SEEK MEDICAL CARE IF:   Your condition is not improving after your transfusion.  You develop redness or irritation at the intravenous (IV) site. SEEK IMMEDIATE MEDICAL CARE IF:  Any of the following symptoms occur over the next 12 hours:  Shaking chills.  You have a temperature by mouth above 102 F (38.9 C), not controlled by medicine.  Chest, back, or muscle pain.  People around you feel you are not acting correctly or are confused.  Shortness of breath or difficulty breathing.  Dizziness and fainting.  You get a rash or develop hives.  You  have a decrease in urine output.  Your urine turns a dark color or changes to pink, red, or brown. Any of the following symptoms occur over the next 10 days:  You have a temperature by mouth above 102 F (38.9 C), not controlled by medicine.  Shortness of breath.  Weakness after normal activity.  The white part of the eye turns yellow (jaundice).  You have a decrease in the amount of urine or are urinating less often.  Your urine turns a dark color or changes to pink, red, or brown. Document Released: 07/01/2000 Document Revised: 09/26/2011 Document Reviewed: 02/18/2008 [facility] Patient Information 2014 [place], [place].  _______________________________________________________________________

## 2018-12-17 NOTE — Progress Notes (Signed)
Need surgery orders in epic asap for 12-20-18 surgery

## 2018-12-17 NOTE — Telephone Encounter (Signed)
Returned call to patient. She says Dr. Irish Elders will not be intubating her for the surgery. I told her that if surgeon was not comfortable then a surgery clearance would have been required. Pt has been advised to call back and schedule a video visit to f/u on Esbriet therapy within the next several weeks of recovering hip surgery. Nothing further needed.

## 2018-12-18 ENCOUNTER — Telehealth: Payer: Self-pay | Admitting: Pulmonary Disease

## 2018-12-18 ENCOUNTER — Ambulatory Visit (HOSPITAL_COMMUNITY): Payer: Medicare Other

## 2018-12-18 LAB — ABO/RH: ABO/RH(D): O POS

## 2018-12-18 NOTE — Progress Notes (Addendum)
Anesthesia Chart Review   Case:  [id number] Date/Time:  12/20/18 5284   Procedure:  TOTAL HIP ARTHROPLASTY ANTERIOR APPROACH (Right ) - 70 mins   Anesthesia type:  Spinal   Pre-op diagnosis:  Right hip osteoarthritis, troch fracture   Location:  [facility] ROOM 09 / [facility]   Surgeon:  Paralee Cancel, MD      DISCUSSION:73 yo never smoker with h/o PONV, anxiety, interstitial lung disease O2 dependent (4L day time, 6L at night), pulmonary HTN, esophageal reflux, RA, sleep apnea, right hip OA with troch fracture scheduled for above procedure 12/20/2018 with Dr. Paralee Cancel.   Pt last seen by cardiologist, Dr. Harrell Gave End, via telemedicine on 11/23/2018. Cardiac Cath 2018 with Mild to moderate, non-obstructive coronary artery disease with 50% mid LAD and 20% proximal RCA stenoses.  Stress Test 2017 low risk study. Per Dr. Saunders Revel, "Based on virtual visits this spring, the patient has considerable respiratory distress that his being investigated by [name].  From a cardiac standpoint, I think that she would be low risk for orthopedic surgery without general anesthesia.  It appears that pulmonary and orthopedics have been in discussion about elevated risk for respiratory complications."  Per Wyn Quaker, NP with pulmonology on 12/19/2018, "Pt with baseline occasional wheeze seen on previous exams. Pt is moderate to high risk for pulmonary complications due to: ILD, Chronis Resp Failure, and Restrictive lung disease. Overall, I recommend proceeding with the surgery if the risk for respiratory complications are outweighed by the potential benefits. This will need to be discussed between the patient and surgeon.  To reduce risks of respiratory complications, I recommend: --Pre- and post-operative incentive spirometry performed frequently while awake --Early ambulation after surgery as able  --Consider inpt rehab / home PT for patient post discharge to help with mobility and increasing physical activity --Inpatient  use of currently prescribed positive-pressure for OSA whenever the patient is sleeping --Avoiding use of pancuronium during anesthesia.  --Can consider consult to our inpt PCCM team if clinically indicated by Ortho"  Pt with h/o lumbar fusion.  Imaging reviewed which shows L2-L5 lumbar fusion with hardware (lumbar spine imaging from 2012 viewed). Discussed with Dr. Lanetta Inch, ok to proceed.  Per Dr. Alvan Dame case urgent due to fracture.  VS: BP (!) 136/56   Pulse 76   Temp (!) 36.2 C (Oral)   Ht _0  (1.549 m)   Wt 94.3 kg   SpO2 98%   BMI 39.30 kg/m   PROVIDERS: Leonard Downing, MD is PCP   End, Harrell Gave, MD is Cardiologist   [name], MD is Pulmonologist with [facility] LABS: Labs reviewed: Acceptable for surgery. (all labs ordered are listed, but only abnormal results are displayed)  Labs Reviewed  BASIC METABOLIC PANEL - Abnormal; Notable for the following components:      Result Value   Glucose, Bld 126 (*)    BUN 25 (*)    All other components within normal limits  CBC WITH DIFFERENTIAL/PLATELET - Abnormal; Notable for the following components:   WBC 11.3 (*)    Neutro Abs 9.2 (*)    Abs Immature Granulocytes 0.10 (*)    All other components within normal limits  SURGICAL PCR SCREEN  PROTIME-INR  TYPE AND SCREEN  ABO/RH     IMAGES:   EKG: 12/17/2018 Rate 69 bpm Normal sinus rhythm Nonspecific ST abnormality T wave inversion improved since last tracing on 08/03/2018.  CV: Cardiac Cath 09/16/2016 Conclusions: 1. Mild to moderate, non-obstructive coronary artery disease  with 50% mid LAD and 20% proximal RCA stenoses. 2. Mild to moderate pulmonary hypertension with mildly elevated right heart filling pressures. 3. Upper normal left heart filling pressures. 4. Normal Fick cardiac output/index. 5. Equalization of end-diastolic pressures with ventricular concordance. This can be seen in the setting of restrictive  process.  Recommendations: 1. Escalate statin therapy to prevent progression of CAD. 2. Increase furosemide to 40 mg BID and KCl to 40 mg BID. Patient to have BMP in 1 week to evaluate renal function and potassium. 3. Continue outpatient pulmonary follow-up. I suspect underlying lung disease is the driving force behind the patient's dyspnea, pulmonary hypertension, and elevated right heart pressures. 4. Outpatient follow-up in cardiology clinic in ~6 weeks.  Echo 05/11/2016 Study Conclusions  - Left ventricle: The cavity size was normal. Wall thickness was   normal. Systolic function was normal. The estimated ejection   fraction was in the range of 55% to 60%. Wall motion was normal;   there were no regional wall motion abnormalities. Doppler   parameters are consistent with abnormal left ventricular   relaxation (grade 1 diastolic dysfunction). - Atrial septum: No defect or patent foramen ovale was identified.  Stress Test 05/20/2016  Nuclear stress EF: 62%.  There was no ST segment deviation noted during stress.  The study is normal.  This is a low risk study.  The left ventricular ejection fraction is normal (55-65%). Past Medical History:  Diagnosis Date  . Acute asthmatic bronchitis   . Allergic rhinitis   . Anxiety   . Arthritis   . Esophageal reflux   . Hypertension   . Interstitial lung disease ([place]) dx jan 2020  . Irritable bowel syndrome   . Oxygen dependent    4 liters day time 6 liters at night  . PONV (postoperative nausea and vomiting)    ponv likes zofran, and scopolamine patch  . Rheumatoid arthritis(714.0)   . Sleep apnea     Past Surgical History:  Procedure Laterality Date  .  c secttion  1977  . 2 foot fusions Left    total of 6 left foot sx  . ABDOMINAL HYSTERECTOMY     complete   . ANKLE FUSION  2009   left  . BACK SURGERY     lower l to l 5 fused  . CHOLECYSTECTOMY    . RIGHT/LEFT HEART CATH AND CORONARY ANGIOGRAPHY N/A 09/16/2016    Procedure: Right/Left Heart Cath and Coronary Angiography;  Surgeon: Nelva Bush, MD;  Location: [facility];  Service: Cardiovascular;  Laterality: N/A;  . TONSILLECTOMY    . TOTAL KNEE ARTHROPLASTY Bilateral     MEDICATIONS: . ALPRAZolam (XANAX) 0.5 MG tablet  . ANORO ELLIPTA 62.5-25 MCG/INH AEPB  . aspirin EC 81 MG tablet  . benzonatate (TESSALON) 200 MG capsule  . cetirizine (ZYRTEC) 10 MG tablet  . clidinium-chlordiazePOXIDE (LIBRAX) 5-2.5 MG capsule  . colestipol (COLESTID) 1 G tablet  . [employer] D3 5000 units capsule  . FLUoxetine (PROZAC) 20 MG capsule  . fluticasone (FLONASE) 50 MCG/ACT nasal spray  . furosemide (LASIX) 40 MG tablet  . glycopyrrolate (ROBINUL) 1 MG tablet  . hydroxychloroquine (PLAQUENIL) 200 MG tablet  . hydroxypropyl methylcellulose / hypromellose (ISOPTO TEARS / GONIOVISC) 2.5 % ophthalmic solution  . ibuprofen (ADVIL,MOTRIN) 400 MG tablet  . ipratropium (ATROVENT) 0.03 % nasal spray  . ipratropium-albuterol (DUONEB) 0.5-2.5 (3) MG/3ML SOLN  . leflunomide (ARAVA) 20 MG tablet  . methocarbamol (ROBAXIN) 750 MG tablet  .  mirtazapine (REMERON) 45 MG tablet  . montelukast (SINGULAIR) 10 MG tablet  . Multiple Vitamin (MULTIVITAMIN) tablet  . omeprazole (PRILOSEC) 40 MG capsule  . OXYGEN  . Pirfenidone (ESBRIET) 267 MG TABS  . potassium chloride SA (K-DUR) 20 MEQ tablet  . PROAIR HFA 108 (90 Base) MCG/ACT inhaler  . rosuvastatin (CRESTOR) 20 MG tablet  . traMADol (ULTRAM) 50 MG tablet  . verapamil (CALAN-SR) 240 MG CR tablet   No current facility-administered medications for this encounter.     Maia Plan [facility] Pre-Surgical Testing [phone] 12/19/18 12:22 PM

## 2018-12-18 NOTE — Telephone Encounter (Signed)
Just now attempted to contact [name].  I have left a voicemail for her.  She can contact our office tomorrow and I will also try to reach her again tomorrow.  I requested that she either asked to speak with me or [place] as [place] will be working with me.  I definitely want to know more information about what particular surgery the patient is having.  Wyn Quaker FNP

## 2018-12-18 NOTE — Telephone Encounter (Signed)
Called and spoke with Heywood Footman, Anesthesia.  She requested a call back from [place] at [phone], 12/19/18. Patient is scheduled for orthopedic surgery 12/20/18, and [name] would like [name], NP to speak with her about Patient condition.  Patient is MR pt, but was last seen by [name], NP 10/01/18, for OSA.  Message routed to [place], NP   Last OV-  Instructions      Return in about 4 weeks (around 10/29/2018), or if symptoms worsen or fail to improve, for Follow up with Dr. Purnell Shoemaker.  We will start the paperwork for Esbriet today   ContinueAnoro Ellipta  >>>Take 1 puff daily in the morning right when you wake up >>>Rinse your mouth out after use >>>This is a daily maintenance inhaler, NOTa rescue inhaler >>>Contact our office if you are having difficulties affording or obtaining this medication >>>It is important for you to be able to take this daily and not miss any doses

## 2018-12-19 ENCOUNTER — Telehealth: Payer: Self-pay | Admitting: Internal Medicine

## 2018-12-19 LAB — NOVEL CORONAVIRUS, NAA (HOSP ORDER, SEND-OUT TO REF LAB; TAT 18-24 HRS): SARS-CoV-2, NAA: NOT DETECTED

## 2018-12-19 NOTE — Telephone Encounter (Signed)
I have not seen [name] in person since 06/2017.  Based on virtual visits this spring, the patient has considerable respiratory distress that his being investigated by [name].  From a cardiac standpoint, I think that she would be low risk for orthopedic surgery without general anesthesia.  It appears that pulmonary and orthopedics have been in discussion about elevated risk for respiratory complications.  Nelva Bush, MD [facility] Pager: [phone]

## 2018-12-19 NOTE — Telephone Encounter (Signed)
12/19/2018 0904  Triage I have spoken with [name]. I have documented a note below regarding the surgical clearence needed for surgery. Pt with baseline occasional wheeze seen on previous exams. Pt is a moderate to high risk for pulmonary complications due to: ILD, Chronic Resp Failure, and Restrictive lung disease.    Peri-operative Assessment of Pulmonary Risk for Non-Thoracic Surgery:  For Ms. [name], risk of perioperative pulmonary complications is increased by:  Age greater than 61 years  Restrictive Lung Disease, Mild Obstruction on 2017 PFT  ILD - RA  Chronic Respiratory Failure - 4L O2 continuous chronically 24/7  Obstructive sleep apnea  NYHA Class II Pulmonary Hypertension  Respiratory complications generally occur in 1% of ASA Class I patients, 5% of ASA Class II and 10% of ASA Class III-IV patients These complications rarely result in mortality and iclude postoperative pneumonia, atelectasis, pulmonary embolism, ARDS and increased time requiring postoperative mechanical ventilation.  Overall, I recommend proceeding with the surgery if the risk for respiratory complications are outweighed by the potential benefits. This will need to be discussed between the patient and surgeon.  To reduce risks of respiratory complications, I recommend: --Pre- and post-operative incentive spirometry performed frequently while awake --Early ambulation after surgery as able  --Consider inpt rehab / home PT for patient post discharge to help with mobility and increasing physical activity --Inpatient use of currently prescribed positive-pressure for OSA whenever the patient is sleeping --Avoiding use of pancuronium during anesthesia. --Can consider consult to our inpt PCCM team if clinically indicated by Ortho  I have discussed the risk factors and recommendations above with anesthesia via telephone today. .     Triage:   - Please contact the patient and notify them that we have communicated our  recommendations to anesthesia  - Please contact jessica at the number on this telephone note thread and ensure she can see my note and nothing further is needed  - Please contact Dr. Alvan Dame office and notify them that pt is a moderate to high risk for pulmonary complications. Please ensure they can see my note, if they cannot then route/fax it to them.   Wyn Quaker FNP

## 2018-12-19 NOTE — Telephone Encounter (Signed)
I spoke with Heywood Footman with Anesthesia.Marland Kitchen she has seen the pt for a scheduled HIP pinning surgery for a fracture 12/20/18.Marland KitchenPt will have SPINAL anesthesia and it is a short procedure... no general is planned.   Pt did not mention cardiac and pulmonary history to the Orthopaedic MD prior to scheduling the surgery.. so [name] is trying to pull things together from an Anesthesia perspective...   She spoke with Wyn Quaker NP with Pulmonary and she is still communicating with him since there is some reluctance on their end.   She is asking for Dr. Darnelle Bos input.. his last note 11/23/18 mentions the potential surgery but not specifically clearing her from a cardiac standpoint.   Will forward to Dr. Erby Pian for review.

## 2018-12-19 NOTE — Telephone Encounter (Signed)
Called and spoke with pt letting her know that [name] has spoken with anesthesia in regards to surgical clearance that was needed from [place] prior to surgery. I stated to pt the info that was stated by [name] and pt stated to me she had spoken with Dr. Alvan Dame and Dr. Alvan Dame told her that she WILL NOT be intubated as they will be doing spinal and IV medications during the surgery.  Pt stated to me she has an appt today at 10:30 with Dr. Alvan Dame for pre-procedure as the surgery is scheduled for tomorrow 6/4 and she is to be there at 7:30 for check-in.  Called [facility], Dr. Aurea Graff office at [phone] and spoke with his nurse Loma Sousa in regards to the surgical clearance for pt. Stated to her that pt is moderate to high risk for pulmonary complications and stated to her that I spoke with pt and was told that Dr. Alvan Dame would not be intubating pt and would be doing spinal and IV and Loma Sousa confirmed that that was correct. Per Loma Sousa, at this time she was not able to see the note from Wyn Quaker, NP in regards to the surgical clearance and asked for me to fax the the encounter to her at [phone].  Called and spoke with [name] from anesthesia asking her if she was able to see the note from [place] in regards to the surgical clearance and [name] stated she was able to see it. I stated to her that I have spoken with both pt and Dr. Aurea Graff office in regards to the clearance and have had confirmation that pt will not be intubated as they will be doing spinal and IV and [name] verbalized understanding. Pt is going to be bring Esbriet with her to hospital so she will not miss a dose of the medication and [name] also stated she told pt to bring her rescue inhaler with her as so she will have it.  Faxing all information to [place] with Dr. Alvan Dame and routing to [place] as an FYI so he can see all my documented info.

## 2018-12-19 NOTE — Telephone Encounter (Signed)
[name] with anesthesia calling Patient is to have total hip surgery tomorrow [name] would like to know if Dr End feels she is ok to continue with procedure - [name] is not with the surgeons office so she does not have a clearance form to fax Please call [name] at [phone]

## 2018-12-19 NOTE — Anesthesia Preprocedure Evaluation (Addendum)
Anesthesia Evaluation  Patient identified by MRN, date of birth, ID band Patient awake    Reviewed: Allergy & Precautions, NPO status , Patient's Chart, lab work & pertinent test results  Airway Mallampati: III  TM Distance: >3 FB Neck ROM: Full    Dental no notable dental hx.    Pulmonary asthma , sleep apnea, Continuous Positive Airway Pressure Ventilation and Oxygen sleep apnea , COPD,  COPD inhaler and oxygen dependent,  Ramaswamy, MD is Pulmonologist with [facility]  Per Wyn Quaker, NP with pulmonology on 12/19/2018, "Pt with baseline occasional wheeze seen on previous exams. Pt is moderate to high risk for pulmonary complications due to: ILD, Chronis Resp Failure, and Restrictive lung disease.    Pulmonary exam normal breath sounds clear to auscultation       Cardiovascular hypertension, + CAD  Normal cardiovascular exam Rhythm:Regular Rate:Normal  ECG: NSR, rate 69   Neuro/Psych PSYCHIATRIC DISORDERS Anxiety Depression negative neurological ROS     GI/Hepatic Neg liver ROS, GERD  Medicated and Controlled,Irritable bowel syndrome   Endo/Other  negative endocrine ROS  Renal/GU negative Renal ROS     Musculoskeletal  (+) Arthritis , Rheumatoid disorders,  L2-L5 lumbar fusion   Abdominal (+) + obese,   Peds  Hematology HLD   Anesthesia Other Findings Right hip osteoarthritis  Troch fracture  Reproductive/Obstetrics                           Anesthesia Physical Anesthesia Plan  ASA: IV  Anesthesia Plan: Spinal   Post-op Pain Management:    Induction:   PONV Risk Score and Plan: 2 and Ondansetron, Dexamethasone, Propofol infusion and Treatment may vary due to age or medical condition  Airway Management Planned: Simple Face Mask  Additional Equipment:   Intra-op Plan:   Post-operative Plan:   Informed Consent: I have reviewed the patients History and Physical, chart,  labs and discussed the procedure including the risks, benefits and alternatives for the proposed anesthesia with the patient or authorized representative who has indicated his/her understanding and acceptance.     Dental advisory given  Plan Discussed with: CRNA  Anesthesia Plan Comments: (Reviewed PAT note 12/17/2018, Konrad Felix, PA-C)       Anesthesia Quick Evaluation

## 2018-12-19 NOTE — H&P (Signed)
TOTAL HIP ADMISSION H&P  Patient is admitted for right total hip arthroplasty, anterior approach.  Subjective:  Chief Complaint: Right hip fracture / pain  HPI: [name], 73 y.o. female, has a history of pain and functional disability in the right hip(s) due to trauma and arthritis and patient has failed non-surgical conservative treatments for greater than 12 weeks to include NSAID's and/or analgesics, corticosteriod injections, use of assistive devices and activity modification.  Onset of symptoms was abrupt starting January 2020 with gradually worsening course since that time.The patient noted no past surgery on the right hip(s).  Patient currently rates pain in the right hip at 10 out of 10 with activity. Patient has night pain, worsening of pain with activity and weight bearing, trendelenberg gait, pain that interfers with activities of daily living and pain with passive range of motion. Patient has evidence of periarticular osteophytes, joint space narrowing and intertrochanteric hip fracture by imaging studies. This condition presents safety issues increasing the risk of falls. This patient has had proximal femur fracture.  There is no current active infection.  Risks, benefits and expectations were discussed with the patient especially with considering her respiratory issues.  Risks including but not limited to the risk of anesthesia, blood clots, nerve damage, blood vessel damage, failure of the prosthesis, infection and up to and including death.  Patient understand the risks, benefits and expectations and wishes to proceed with surgery.   PCP: Leonard Downing, MD  D/C Plans:       Home  Post-op Meds:       No Rx given  Tranexamic Acid:      To be given - IV   Decadron:      Is to be given  FYI:      ASA  Norco  Do NOT stop her [name] (she has already discussed with the pharmacy)  DME:   Pt already has equipment  PT:    No PT     Patient Active Problem List   Diagnosis Date Noted  . Pulmonary fibrosis ([place]) 10/12/2018  . Chronic heart failure with preserved ejection fraction (HFpEF) ([place]) 10/12/2018  . Acute on chronic respiratory failure with hypoxia ([place]) 08/29/2018  . Bronchiectasis without complication ([place]) 02/72/5366  . Wound infection after surgery 05/09/2018  . Hyperlipidemia LDL goal <70 01/09/2017  . Ankle arthritis 12/19/2016  . 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 Hallux valgus of left foot 12/19/2016  . Painful orthopaedic hardware ([place]) 12/19/2016  . Pes planus 12/19/2016  . Subluxation of interphalangeal joint of lesser toe of left foot 12/19/2016  . Pulmonary hypertension ([place]) 10/25/2016  . Coronary artery disease involving native coronary artery of native heart without angina pectoris 10/25/2016  . Shortness of breath 09/12/2016  . Leg edema 09/12/2016  . Obstructive sleep apnea 09/12/2016  . ILD (interstitial lung disease) ([place]) 09/19/2015  . Thrush, oral 06/12/2014  . Influenza A with pneumonia 08/02/2013  . CAP (community acquired pneumonia) 07/31/2013  . Hypoxia 07/31/2013  . Open wound of knee, leg (except thigh), and ankle, without mention of complication 44/09/4740  . Bronchitis, chronic obstructive, with exacerbation ([place]) 07/21/2009  . Asthmatic bronchitis, moderate persistent, uncomplicated 59/56/3875  . RHINOSINUSITIS, CHRONIC 12/19/2008  . ANXIETY DEPRESSION 03/25/2008  . ASTHMATIC BRONCHITIS, ACUTE 05/12/2007  . Seasonal and perennial allergic rhinitis 05/12/2007  . REFLUX, ESOPHAGEAL 05/12/2007  . Rheumatoid arthritis ([place]) 05/12/2007   Past Medical History:  Diagnosis Date  . Acute asthmatic bronchitis   . Allergic rhinitis   .  Anxiety   . Arthritis   . Esophageal reflux   . Hypertension   . Interstitial lung disease ([place]) dx jan 2020  . Irritable bowel syndrome   . Oxygen dependent    4 liters day time 6 liters at night  . PONV (postoperative nausea and vomiting)    ponv likes zofran, and  scopolamine patch  . Rheumatoid arthritis(714.0)   . Sleep apnea     Past Surgical History:  Procedure Laterality Date  .  c secttion  1977  . 2 foot fusions Left    total of 6 left foot sx  . ABDOMINAL HYSTERECTOMY     complete   . ANKLE FUSION  2009   left  . BACK SURGERY     lower l to l 5 fused  . CHOLECYSTECTOMY    . RIGHT/LEFT HEART CATH AND CORONARY ANGIOGRAPHY N/A 09/16/2016   Procedure: Right/Left Heart Cath and Coronary Angiography;  Surgeon: Nelva Bush, MD;  Location: [facility];  Service: Cardiovascular;  Laterality: N/A;  . TONSILLECTOMY    . TOTAL KNEE ARTHROPLASTY Bilateral     No current facility-administered medications for this encounter.    Current Outpatient Medications  Medication Sig Dispense Refill Last Dose  . ALPRAZolam (XANAX) 0.5 MG tablet Take 0.5 mg by mouth as needed for anxiety.    Taking  . ANORO ELLIPTA 62.5-25 MCG/INH AEPB USE 1 INHALATION DAILY (Patient taking differently: Inhale 1 puff into the lungs daily. ) 180 each 2 Taking  . aspirin EC 81 MG tablet Take 81 mg by mouth daily.   Taking  . benzonatate (TESSALON) 200 MG capsule TAKE 1 CAPSULE BY MOUTH 3 TIMES A DAY AS NEEDED FOR COUGH (Patient taking differently: Take 200 mg by mouth 3 (three) times daily as needed for cough. TAKE 1 CAPSULE BY MOUTH 3 TIMES A DAY AS NEEDED FOR COUGH) 30 capsule 3   . cetirizine (ZYRTEC) 10 MG tablet Take 10 mg by mouth daily.   Taking  . clidinium-chlordiazePOXIDE (LIBRAX) 5-2.5 MG capsule Take 1 capsule by mouth 2 (two) times daily as needed (for IBS).   Taking  . colestipol (COLESTID) 1 G tablet Take 1 g by mouth 2 (two) times daily.    Taking  . [employer] D3 5000 units capsule Take 5,000 Units by mouth daily.  2 Taking  . FLUoxetine (PROZAC) 20 MG capsule Take 20 mg by mouth daily.   Taking  . fluticasone (FLONASE) 50 MCG/ACT nasal spray Place 2 sprays into the nose daily.    Taking  . furosemide (LASIX) 40 MG tablet Take 1 tablet (40 mg total) by mouth  2 (two) times daily. 180 tablet 1   . glycopyrrolate (ROBINUL) 1 MG tablet Take 1 mg by mouth 3 (three) times daily as needed (for IBS).    Taking  . hydroxychloroquine (PLAQUENIL) 200 MG tablet Take 400 mg by mouth daily.    Taking  . hydroxypropyl methylcellulose / hypromellose (ISOPTO TEARS / GONIOVISC) 2.5 % ophthalmic solution Place 1-2 drops into both eyes 3 (three) times daily as needed for dry eyes.   Taking  . ibuprofen (ADVIL,MOTRIN) 400 MG tablet Take 400 mg by mouth as needed for headache or moderate pain.    Taking  . ipratropium (ATROVENT) 0.03 % nasal spray USE 1 TO 2 SPRAYS INTO BOTH NOSTRILS 2 TIMES DAILY. (Patient taking differently: Place 1-2 sprays into both nostrils 2 (two) times daily. ) 90 mL 0   . ipratropium-albuterol (DUONEB) 0.5-2.5 (  3) MG/3ML SOLN Use 1 vial in nebulizer every 6 hours as needed (Patient taking differently: Take 3 mLs by nebulization 2 (two) times a day. Use 1 vial in nebulizer every 6 hours as needed) 90 mL 12 Taking  . leflunomide (ARAVA) 20 MG tablet Take 20 mg by mouth daily.    Taking  . methocarbamol (ROBAXIN) 750 MG tablet Take 750 mg by mouth 3 (three) times daily as needed for muscle spasms.      . mirtazapine (REMERON) 45 MG tablet Take 45 mg by mouth at bedtime.   Taking  . montelukast (SINGULAIR) 10 MG tablet TAKE 1 TABLET BY MOUTH AT  BEDTIME (Patient taking differently: Take 10 mg by mouth at bedtime. ) 90 tablet 0   . Multiple Vitamin (MULTIVITAMIN) tablet Take 1 tablet by mouth daily.   Taking  . omeprazole (PRILOSEC) 40 MG capsule Take 40 mg by mouth at bedtime.    Taking  . OXYGEN Inhale 4-6 mLs into the lungs continuous. 4 during the day, 6 at night     . Pirfenidone (ESBRIET) 267 MG TABS Take 534-801 mg by mouth 3 (three) times daily. 534 mg in the am 801 at lunch 801 at night   Taking  . potassium chloride SA (K-DUR) 20 MEQ tablet Take 1 tablet (20 mEq total) by mouth 2 (two) times daily. 180 tablet 1   . PROAIR HFA 108 (90 Base)  MCG/ACT inhaler USE 2 PUFFS BY MOUTH EVERY  6 HOURS AS NEEDED (Patient taking differently: Inhale 2 puffs into the lungs every 6 (six) hours as needed for wheezing or shortness of breath. ) 25.5 g 3 Taking  . rosuvastatin (CRESTOR) 20 MG tablet Take 1 tablet (20 mg total) by mouth daily. 90 tablet 1   . traMADol (ULTRAM) 50 MG tablet Take 50 mg by mouth 2 (two) times daily.    Taking  . verapamil (CALAN-SR) 240 MG CR tablet Take 1 tablet (240 mg total) by mouth daily. 90 tablet 1    Allergies  Allergen Reactions  . Cefdinir Diarrhea  . Erythromycin Base Diarrhea    Other  . Lactose Intolerance (Gi) Diarrhea  . Lactulose Diarrhea  . Mesalamine Nausea Only  . Nitrofuran Derivatives     shakiness  . Nitrofurantoin Other (See Comments)    shakiness other  . Other Diarrhea and Other (See Comments)    Shaking uncontrollably "lettuce only" "lettuce only"  . Sulfa Antibiotics Other (See Comments)    Patient can't recall reaction   . Tdap [Tetanus-Diphth-Acell Pertussis]     Shaking uncontrollably  . Tetanus Toxoid, Adsorbed Other (See Comments)    Shaking uncontrollable     Social History   Tobacco Use  . Smoking status: Never Smoker  . Smokeless tobacco: Never Used  . Tobacco comment: positive passive tobacco smoke exposure  Substance Use Topics  . Alcohol use: Yes    Comment: glass of wine each night     Family History  Problem Relation Age of Onset  . Heart disease Mother   . Arthritis Mother   . Heart attack Father   . Diabetes Other        sibling  . Heart attack Other        sibling     Review of Systems  Constitutional: Negative.   HENT: Negative.   Eyes: Negative.   Respiratory: Positive for shortness of breath and wheezing.   Cardiovascular: Negative.   Gastrointestinal: Positive for constipation, diarrhea and heartburn.  Genitourinary: Negative.   Musculoskeletal: Positive for joint pain.  Skin: Negative.   Neurological: Negative.   Endo/Heme/Allergies:  Positive for environmental allergies.  Psychiatric/Behavioral: Positive for depression. The patient is nervous/anxious.     Objective:  Physical Exam  Constitutional: She is oriented to person, place, and time. She appears well-developed.  HENT:  Head: Normocephalic.  Eyes: Pupils are equal, round, and reactive to light.  Neck: Neck supple. No JVD present. No tracheal deviation present. No thyromegaly present.  Cardiovascular: Normal rate, regular rhythm and intact distal pulses.  Respiratory: Effort normal and breath sounds normal. No respiratory distress. She has no wheezes.  GI: Soft. There is no abdominal tenderness. There is no guarding.  Musculoskeletal:     Right hip: She exhibits decreased range of motion, decreased strength, tenderness and bony tenderness. She exhibits no laceration.  Lymphadenopathy:    She has no cervical adenopathy.  Neurological: She is alert and oriented to person, place, and time.  Skin: Skin is warm and dry.  Psychiatric: She has a normal mood and affect.      Labs:  Estimated body mass index is 39.3 kg/m as calculated from the following:   Height as of 12/17/18: _0  (1.549 m).   Weight as of 12/17/18: 94.3 kg.   Imaging Review Plain radiographs demonstrate  degenerative joint disease of the right hip with intertrochanteric femur fracture. The bone quality appears to be fair for age and reported activity level.      Assessment/Plan:  End stage arthritis, right hip with intertrochanteric femur fracture  The patient history, physical examination, clinical judgement of the provider and imaging studies are consistent with end stage degenerative joint disease of the right hip(s) and total hip arthroplasty is deemed medically necessary. The treatment options including medical management, injection therapy, arthroscopy and arthroplasty were discussed at length. The risks and benefits of total hip arthroplasty were presented and reviewed. The risks  due to aseptic loosening, infection, stiffness, dislocation/subluxation,  thromboembolic complications and other imponderables were discussed.  The patient acknowledged the explanation, agreed to proceed with the plan and consent was signed. Patient is being admitted for inpatient treatment for surgery, pain control, PT, OT, prophylactic antibiotics, VTE prophylaxis, progressive ambulation and ADL's and discharge planning.The patient is planning to be discharged home.     West Pugh Sung Parodi   PA-C  12/19/2018, 8:56 PM

## 2018-12-19 NOTE — Progress Notes (Signed)
SPOKE W/  Voicemail. Pt advised to call back if answers yes to any of the following.      SCREENING SYMPTOMS OF COVID 19:   COUGH--  RUNNY NOSE---   SORE THROAT---  NASAL CONGESTION----  SNEEZING----  SHORTNESS OF BREATH---  DIFFICULTY BREATHING---  TEMP >100.0 -----  UNEXPLAINED BODY ACHES------  CHILLS --------   HEADACHES ---------  LOSS OF SMELL/ TASTE --------    HAVE YOU OR ANY FAMILY MEMBER TRAVELLED PAST 14 DAYS OUT OF THE   COUNTY--- STATE---- COUNTRY----  HAVE YOU OR ANY FAMILY MEMBER BEEN EXPOSED TO ANYONE WITH COVID 19?

## 2018-12-19 NOTE — Telephone Encounter (Addendum)
LM for Jessica with Anesthesia per Dr. Darnelle Bos response.. will try to forward message to her.   Unable to send... routed to Dr. Alvan Dame

## 2018-12-19 NOTE — Telephone Encounter (Signed)
Thank you for all of your hardwork with that.   [name]

## 2018-12-20 ENCOUNTER — Encounter (HOSPITAL_COMMUNITY)
Admission: RE | Disposition: A | Discharge status: Home Health | Payer: Self-pay | Source: Home / Self Care | Attending: Orthopedic Surgery

## 2018-12-20 ENCOUNTER — Encounter (HOSPITAL_COMMUNITY): Payer: Self-pay | Admitting: Emergency Medicine

## 2018-12-20 ENCOUNTER — Inpatient Hospital Stay (HOSPITAL_COMMUNITY): Payer: Medicare Other | Admitting: Physician Assistant

## 2018-12-20 ENCOUNTER — Observation Stay (HOSPITAL_COMMUNITY)
Admission: RE | Admit: 2018-12-20 | Discharge: 2018-12-22 | Disposition: A | Discharge status: Home Health | Payer: Medicare Other | Attending: Orthopedic Surgery | Admitting: Orthopedic Surgery

## 2018-12-20 ENCOUNTER — Ambulatory Visit (HOSPITAL_COMMUNITY): Payer: Medicare Other

## 2018-12-20 ENCOUNTER — Inpatient Hospital Stay (HOSPITAL_COMMUNITY): Payer: Medicare Other | Admitting: Anesthesiology

## 2018-12-20 ENCOUNTER — Inpatient Hospital Stay (HOSPITAL_COMMUNITY): Payer: Medicare Other

## 2018-12-20 ENCOUNTER — Other Ambulatory Visit: Payer: Self-pay

## 2018-12-20 ENCOUNTER — Observation Stay (HOSPITAL_COMMUNITY): Payer: Medicare Other

## 2018-12-20 DIAGNOSIS — I110 Hypertensive heart disease with heart failure: Secondary | ICD-10-CM | POA: Insufficient documentation

## 2018-12-20 DIAGNOSIS — G4730 Sleep apnea, unspecified: Secondary | ICD-10-CM | POA: Insufficient documentation

## 2018-12-20 DIAGNOSIS — K589 Irritable bowel syndrome without diarrhea: Secondary | ICD-10-CM | POA: Insufficient documentation

## 2018-12-20 DIAGNOSIS — K219 Gastro-esophageal reflux disease without esophagitis: Secondary | ICD-10-CM | POA: Diagnosis not present

## 2018-12-20 DIAGNOSIS — Z9981 Dependence on supplemental oxygen: Secondary | ICD-10-CM | POA: Insufficient documentation

## 2018-12-20 DIAGNOSIS — M1611 Unilateral primary osteoarthritis, right hip: Principal | ICD-10-CM | POA: Insufficient documentation

## 2018-12-20 DIAGNOSIS — Z1159 Encounter for screening for other viral diseases: Secondary | ICD-10-CM | POA: Insufficient documentation

## 2018-12-20 DIAGNOSIS — Z419 Encounter for procedure for purposes other than remedying health state, unspecified: Secondary | ICD-10-CM

## 2018-12-20 DIAGNOSIS — Z79899 Other long term (current) drug therapy: Secondary | ICD-10-CM | POA: Insufficient documentation

## 2018-12-20 DIAGNOSIS — I5032 Chronic diastolic (congestive) heart failure: Secondary | ICD-10-CM | POA: Insufficient documentation

## 2018-12-20 DIAGNOSIS — M069 Rheumatoid arthritis, unspecified: Secondary | ICD-10-CM | POA: Insufficient documentation

## 2018-12-20 DIAGNOSIS — Z96641 Presence of right artificial hip joint: Secondary | ICD-10-CM

## 2018-12-20 DIAGNOSIS — Z6839 Body mass index (BMI) 39.0-39.9, adult: Secondary | ICD-10-CM | POA: Insufficient documentation

## 2018-12-20 DIAGNOSIS — Z96649 Presence of unspecified artificial hip joint: Secondary | ICD-10-CM

## 2018-12-20 DIAGNOSIS — J8410 Pulmonary fibrosis, unspecified: Secondary | ICD-10-CM | POA: Insufficient documentation

## 2018-12-20 DIAGNOSIS — I2510 Atherosclerotic heart disease of native coronary artery without angina pectoris: Secondary | ICD-10-CM | POA: Insufficient documentation

## 2018-12-20 DIAGNOSIS — Z7982 Long term (current) use of aspirin: Secondary | ICD-10-CM | POA: Insufficient documentation

## 2018-12-20 DIAGNOSIS — E785 Hyperlipidemia, unspecified: Secondary | ICD-10-CM | POA: Insufficient documentation

## 2018-12-20 DIAGNOSIS — J449 Chronic obstructive pulmonary disease, unspecified: Secondary | ICD-10-CM | POA: Insufficient documentation

## 2018-12-20 DIAGNOSIS — E669 Obesity, unspecified: Secondary | ICD-10-CM | POA: Diagnosis present

## 2018-12-20 DIAGNOSIS — F419 Anxiety disorder, unspecified: Secondary | ICD-10-CM | POA: Diagnosis not present

## 2018-12-20 DIAGNOSIS — Z7951 Long term (current) use of inhaled steroids: Secondary | ICD-10-CM | POA: Insufficient documentation

## 2018-12-20 HISTORY — PX: TOTAL HIP ARTHROPLASTY: SHX124

## 2018-12-20 LAB — TYPE AND SCREEN
ABO/RH(D): O POS
Antibody Screen: NEGATIVE

## 2018-12-20 IMAGING — US US THYROID
1 series · 14 of 25 positions shown · non-contrast
Comparison: Scintigraphy 11/23/2016

CLINICAL DATA: Nodule noted on carotid ultrasound done elsewhere.

EXAM:
THYROID ULTRASOUND
TECHNIQUE: Ultrasound examination of the thyroid gland and adjacent soft
tissues was performed.

[Series 1: us thyroid · 0.06mm/px · 14 of 47 slices shown]
[im 1/47]
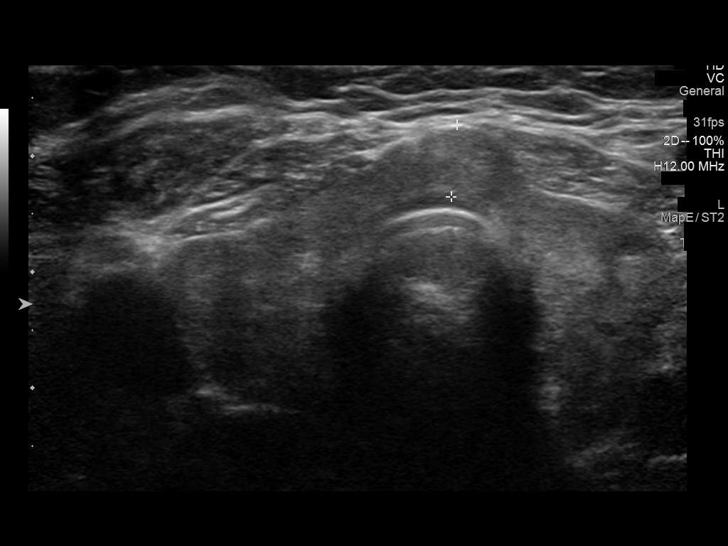
[im 4/47]
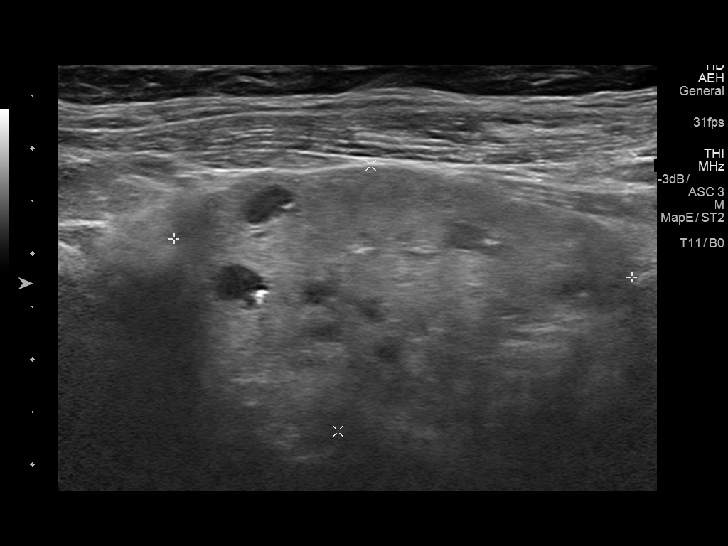
[im 8/47]
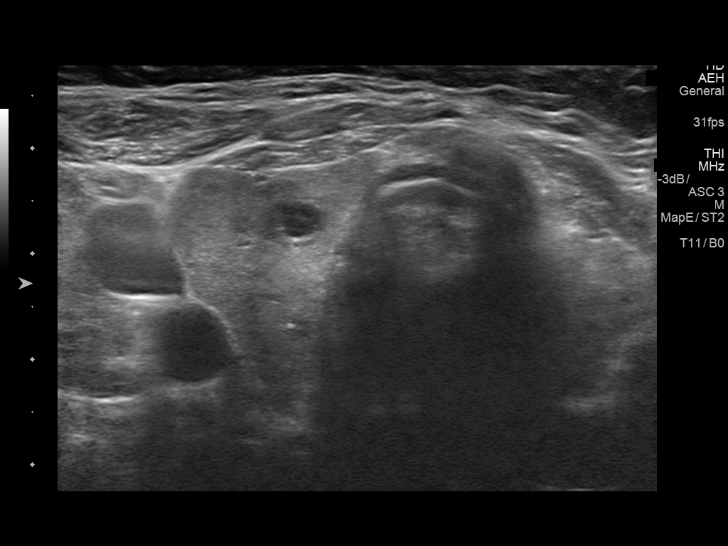
[im 12/47]
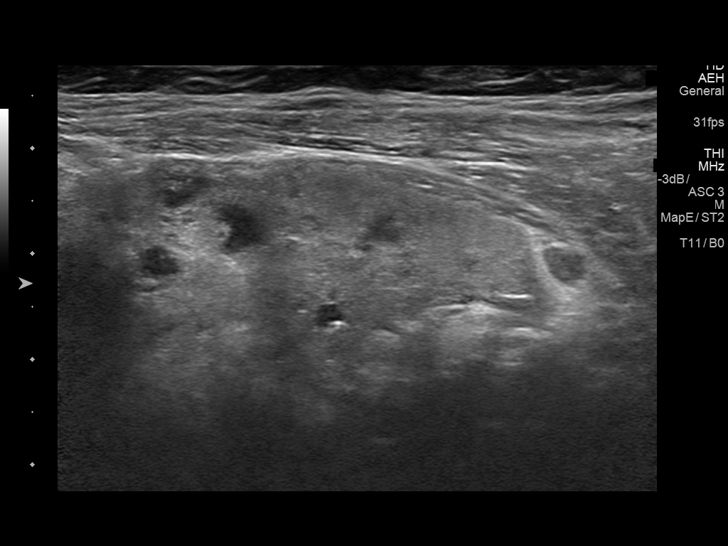
[im 16/47]
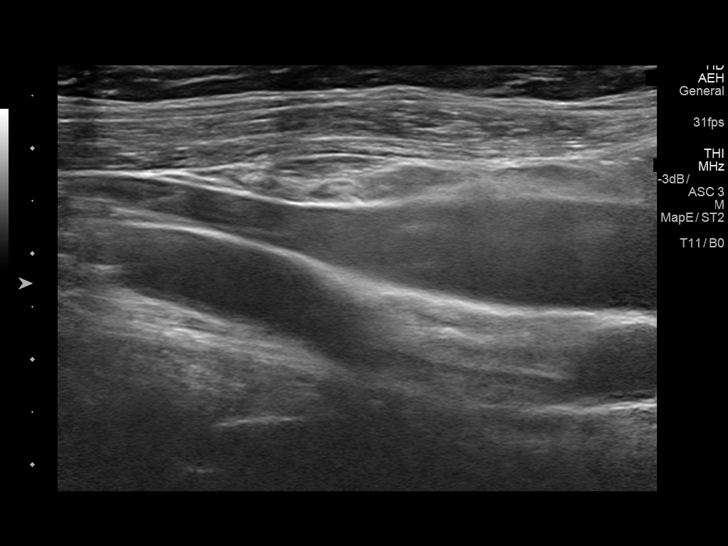
[im 18/47]
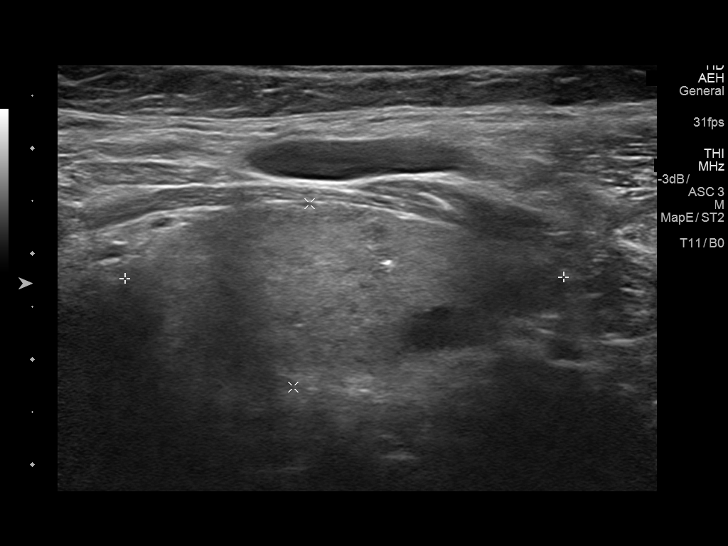
[im 22/47]
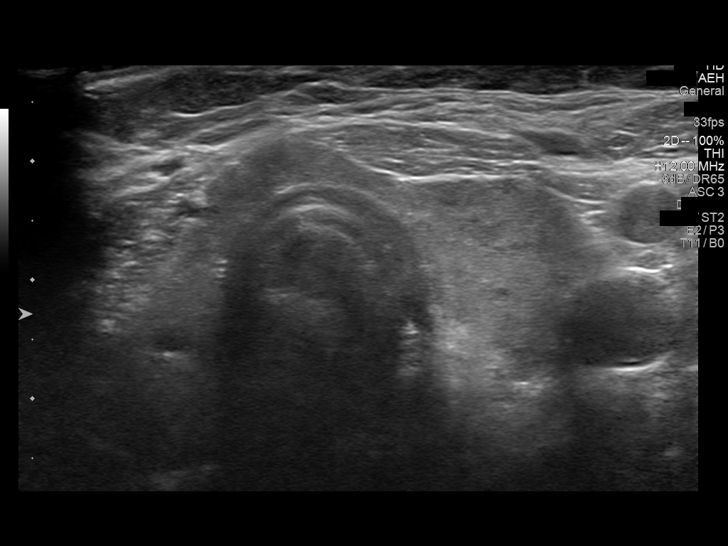
[im 25/47]
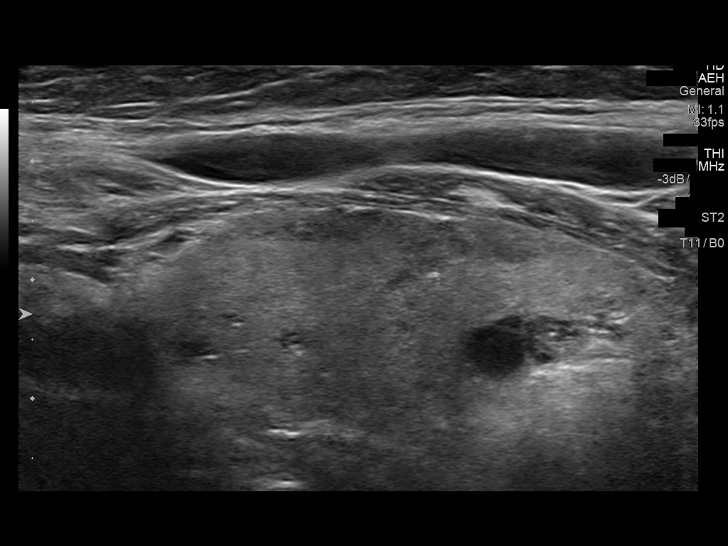
[im 29/47]
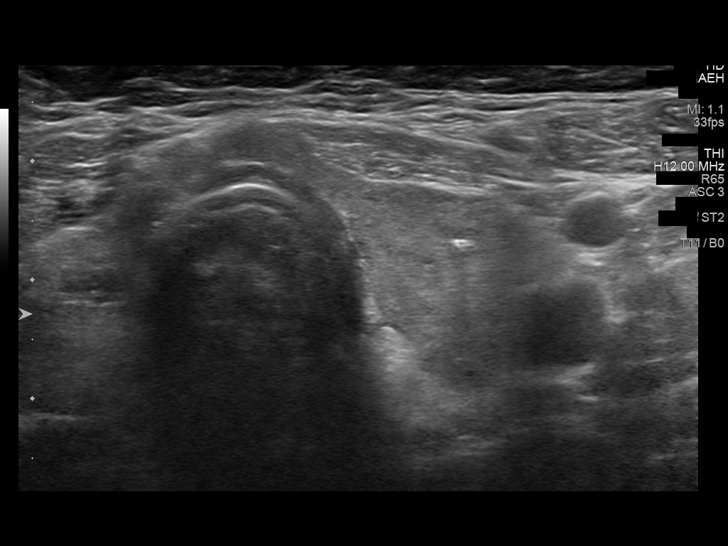
[im 31/47]
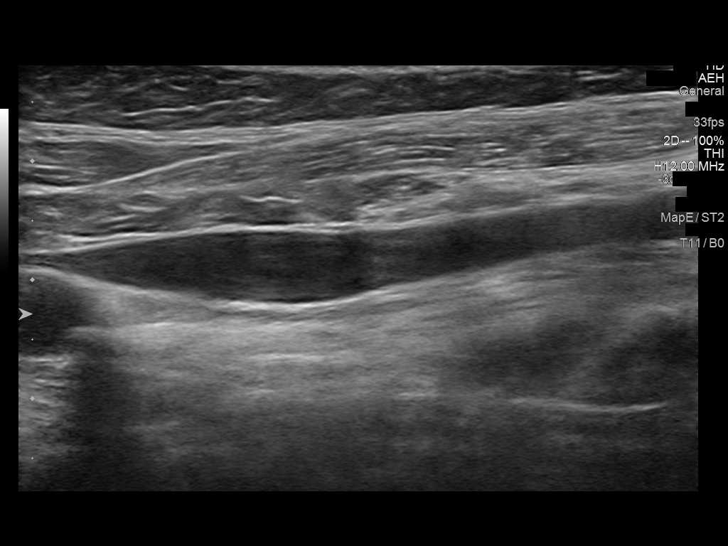
[im 35/47]
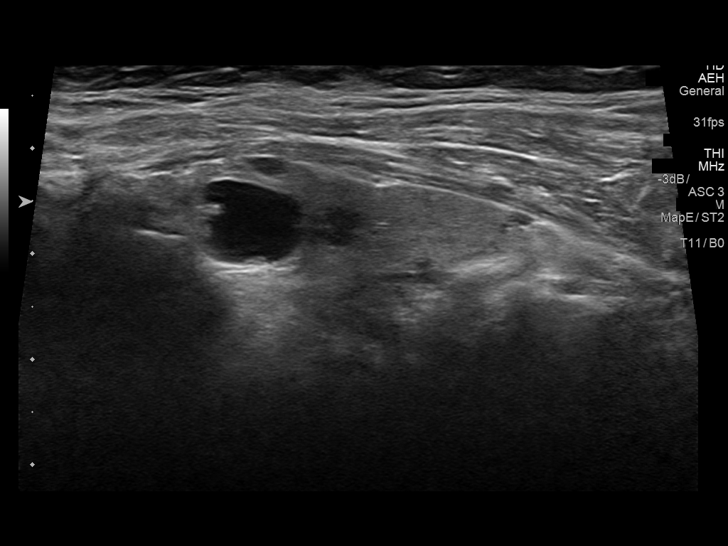
[im 39/47]
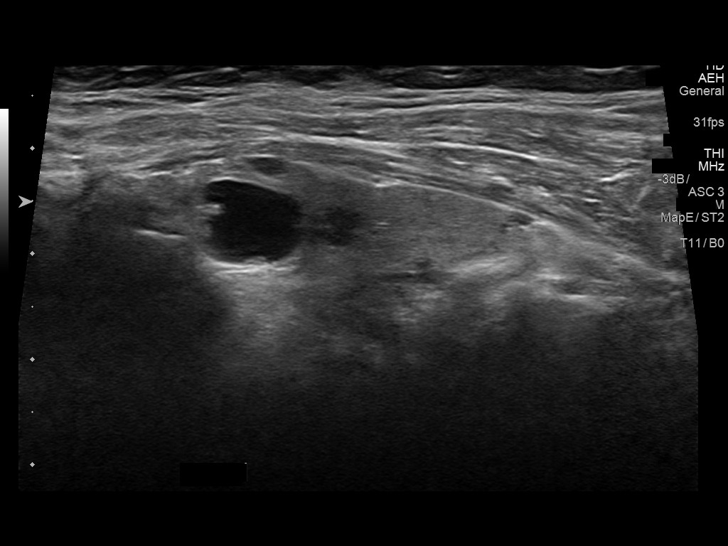
[im 43/47]
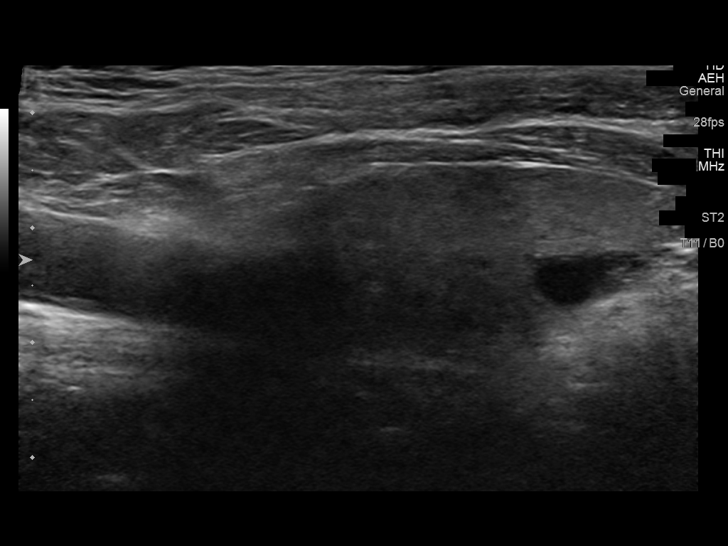
[im 47/47]
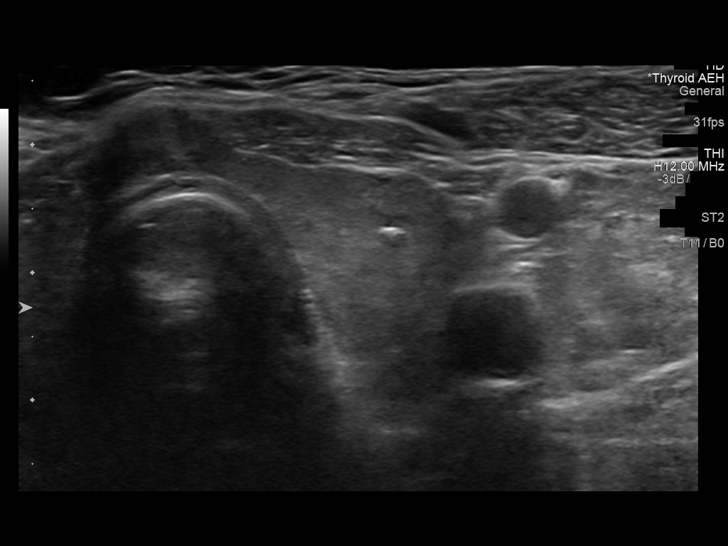

[14 of 25 positions shown; findings below may reference images not displayed]

FINDINGS: Parenchymal Echotexture: Moderately heterogenous

Isthmus:

Right lobe: 4.4 x 2.5 x

Left lobe: 4.2 x 1.7 x

_________________________________________________________

Estimated total number of nodules >/= 1 cm: 1

Number of spongiform nodules >/=  2 cm not described below (TR1): 0

Number of mixed cystic and solid nodules >/= 1.5 cm not described
below (TR2): 0

_________________________________________________________

1 x 0.7 x 1 cm colloid cyst, superior right

Multiple scattered subcentimeter hypoechoic/ cystic lesions
throughout both lobes.
IMPRESSION: 1. Borderline thyromegaly with scattered bilateral small nodules and
cysts. None meets criteria for biopsy or dedicated imaging
follow-up.

The above is in keeping with the ACR TI-RADS recommendations - [HOSPITAL] 4746;[DATE].

## 2018-12-20 SURGERY — ARTHROPLASTY, HIP, TOTAL, ANTERIOR APPROACH
Anesthesia: Spinal | Site: Hip | Laterality: Right

## 2018-12-20 MED ORDER — HYDROCODONE-ACETAMINOPHEN 5-325 MG PO TABS: 1.0000 | ORAL_TABLET | ORAL | Status: DC | PRN

## 2018-12-20 MED ORDER — IPRATROPIUM-ALBUTEROL 0.5-2.5 (3) MG/3ML IN SOLN
3.0000 mL | Freq: Four times a day (QID) | RESPIRATORY_TRACT | Status: DC | PRN
  Administered 2018-12-21 (×2): 3 mL via RESPIRATORY_TRACT
  Filled 2018-12-20 (×2): qty 3

## 2018-12-20 MED ORDER — TRAMADOL HCL 50 MG PO TABS
50.0000 mg | ORAL_TABLET | Freq: Four times a day (QID) | ORAL | Status: DC
  Administered 2018-12-20 – 2018-12-22 (×6): 50 mg via ORAL
  Filled 2018-12-20 (×6): qty 1

## 2018-12-20 MED ORDER — OMEPRAZOLE 20 MG PO CPDR
40.0000 mg | DELAYED_RELEASE_CAPSULE | Freq: Every day | ORAL | Status: DC
  Administered 2018-12-21 – 2018-12-22 (×2): 40 mg via ORAL
  Filled 2018-12-20 (×2): qty 2

## 2018-12-20 MED ORDER — PROPOFOL 500 MG/50ML IV EMUL
INTRAVENOUS | Status: DC | PRN
  Administered 2018-12-20: 25 ug/kg/min via INTRAVENOUS

## 2018-12-20 MED ORDER — METOCLOPRAMIDE HCL 5 MG PO TABS: 5.0000 mg | ORAL_TABLET | Freq: Three times a day (TID) | ORAL | Status: DC | PRN

## 2018-12-20 MED ORDER — PIRFENIDONE 267 MG PO TABS
801.0000 mg | ORAL_TABLET | ORAL | Status: DC
  Administered 2018-12-20 – 2018-12-21 (×3): 801 mg via ORAL

## 2018-12-20 MED ORDER — ALBUTEROL SULFATE HFA 108 (90 BASE) MCG/ACT IN AERS: 2.0000 | INHALATION_SPRAY | Freq: Four times a day (QID) | RESPIRATORY_TRACT | Status: DC | PRN

## 2018-12-20 MED ORDER — FENTANYL CITRATE (PF) 100 MCG/2ML IJ SOLN
INTRAMUSCULAR | Status: AC
  Filled 2018-12-20: qty 2

## 2018-12-20 MED ORDER — FUROSEMIDE 40 MG PO TABS
40.0000 mg | ORAL_TABLET | Freq: Two times a day (BID) | ORAL | Status: DC
  Administered 2018-12-20 – 2018-12-22 (×3): 40 mg via ORAL
  Filled 2018-12-20 (×3): qty 1

## 2018-12-20 MED ORDER — MORPHINE SULFATE (PF) 2 MG/ML IV SOLN: 0.5000 mg | INTRAVENOUS | Status: DC | PRN

## 2018-12-20 MED ORDER — LEFLUNOMIDE 20 MG PO TABS
20.0000 mg | ORAL_TABLET | Freq: Every day | ORAL | Status: DC
  Administered 2018-12-21 – 2018-12-22 (×2): 20 mg via ORAL
  Filled 2018-12-20 (×2): qty 1

## 2018-12-20 MED ORDER — DEXAMETHASONE SODIUM PHOSPHATE 10 MG/ML IJ SOLN
10.0000 mg | Freq: Once | INTRAMUSCULAR | Status: AC
  Administered 2018-12-20: 5 mg via INTRAVENOUS

## 2018-12-20 MED ORDER — ONDANSETRON HCL 4 MG PO TABS: 4.0000 mg | ORAL_TABLET | Freq: Four times a day (QID) | ORAL | Status: DC | PRN

## 2018-12-20 MED ORDER — PIRFENIDONE 267 MG PO TABS
534.0000 mg | ORAL_TABLET | Freq: Every day | ORAL | Status: DC
  Administered 2018-12-21 – 2018-12-22 (×2): 534 mg via ORAL

## 2018-12-20 MED ORDER — VERAPAMIL HCL ER 240 MG PO TBCR
240.0000 mg | EXTENDED_RELEASE_TABLET | Freq: Every day | ORAL | Status: DC
  Administered 2018-12-21 – 2018-12-22 (×2): 240 mg via ORAL
  Filled 2018-12-20 (×2): qty 1

## 2018-12-20 MED ORDER — ASPIRIN 81 MG PO CHEW
81.0000 mg | CHEWABLE_TABLET | Freq: Two times a day (BID) | ORAL | Status: DC
  Administered 2018-12-20 – 2018-12-22 (×4): 81 mg via ORAL
  Filled 2018-12-20 (×4): qty 1

## 2018-12-20 MED ORDER — KETAMINE HCL 10 MG/ML IJ SOLN
INTRAMUSCULAR | Status: AC
  Filled 2018-12-20: qty 1

## 2018-12-20 MED ORDER — MIRTAZAPINE 15 MG PO TABS
45.0000 mg | ORAL_TABLET | Freq: Every day | ORAL | Status: DC
  Administered 2018-12-20 – 2018-12-21 (×2): 45 mg via ORAL
  Filled 2018-12-20 (×2): qty 3

## 2018-12-20 MED ORDER — CEFAZOLIN SODIUM-DEXTROSE 2-4 GM/100ML-% IV SOLN
2.0000 g | Freq: Four times a day (QID) | INTRAVENOUS | Status: AC
  Administered 2018-12-20 (×2): 2 g via INTRAVENOUS
  Filled 2018-12-20 (×3): qty 100

## 2018-12-20 MED ORDER — PHENOL 1.4 % MT LIQD: 1.0000 | OROMUCOSAL | Status: DC | PRN

## 2018-12-20 MED ORDER — FLUTICASONE PROPIONATE 50 MCG/ACT NA SUSP
2.0000 | Freq: Every day | NASAL | Status: DC
  Administered 2018-12-21 – 2018-12-22 (×2): 2 via NASAL
  Filled 2018-12-20: qty 16

## 2018-12-20 MED ORDER — DEXAMETHASONE SODIUM PHOSPHATE 10 MG/ML IJ SOLN
INTRAMUSCULAR | Status: AC
  Filled 2018-12-20: qty 3

## 2018-12-20 MED ORDER — IPRATROPIUM BROMIDE 0.03 % NA SOLN
1.0000 | Freq: Two times a day (BID) | NASAL | Status: DC
  Administered 2018-12-20 – 2018-12-21 (×3): 2 via NASAL
  Filled 2018-12-20: qty 30

## 2018-12-20 MED ORDER — DIPHENHYDRAMINE HCL 12.5 MG/5ML PO ELIX: 12.5000 mg | ORAL_SOLUTION | ORAL | Status: DC | PRN

## 2018-12-20 MED ORDER — ONDANSETRON HCL 4 MG/2ML IJ SOLN: 4.0000 mg | Freq: Four times a day (QID) | INTRAMUSCULAR | Status: DC | PRN

## 2018-12-20 MED ORDER — ACETAMINOPHEN 500 MG PO TABS
1000.0000 mg | ORAL_TABLET | Freq: Once | ORAL | Status: AC
  Administered 2018-12-20: 1000 mg via ORAL
  Filled 2018-12-20: qty 2

## 2018-12-20 MED ORDER — MONTELUKAST SODIUM 10 MG PO TABS
10.0000 mg | ORAL_TABLET | Freq: Every day | ORAL | Status: DC
  Administered 2018-12-20 – 2018-12-21 (×2): 10 mg via ORAL
  Filled 2018-12-20 (×2): qty 1

## 2018-12-20 MED ORDER — NON FORMULARY: 40.0000 mg | Freq: Every day | Status: DC

## 2018-12-20 MED ORDER — ONDANSETRON HCL 4 MG/2ML IJ SOLN
INTRAMUSCULAR | Status: DC | PRN
  Administered 2018-12-20: 4 mg via INTRAVENOUS

## 2018-12-20 MED ORDER — ROSUVASTATIN CALCIUM 20 MG PO TABS
20.0000 mg | ORAL_TABLET | Freq: Every day | ORAL | Status: DC
  Administered 2018-12-21 – 2018-12-22 (×2): 20 mg via ORAL
  Filled 2018-12-20 (×2): qty 1

## 2018-12-20 MED ORDER — GLYCOPYRROLATE 1 MG PO TABS
1.0000 mg | ORAL_TABLET | Freq: Three times a day (TID) | ORAL | Status: DC | PRN
  Filled 2018-12-20: qty 1

## 2018-12-20 MED ORDER — CHLORHEXIDINE GLUCONATE 4 % EX LIQD: 60.0000 mL | Freq: Once | CUTANEOUS | Status: DC

## 2018-12-20 MED ORDER — LORATADINE 10 MG PO TABS
10.0000 mg | ORAL_TABLET | Freq: Every day | ORAL | Status: DC
  Administered 2018-12-21 – 2018-12-22 (×2): 10 mg via ORAL
  Filled 2018-12-20 (×2): qty 1

## 2018-12-20 MED ORDER — MENTHOL 3 MG MT LOZG: 1.0000 | LOZENGE | OROMUCOSAL | Status: DC | PRN

## 2018-12-20 MED ORDER — POLYETHYLENE GLYCOL 3350 17 G PO PACK
17.0000 g | PACK | Freq: Two times a day (BID) | ORAL | Status: DC
  Administered 2018-12-22: 11:00:00 17 g via ORAL
  Filled 2018-12-20 (×4): qty 1

## 2018-12-20 MED ORDER — METOCLOPRAMIDE HCL 5 MG/ML IJ SOLN: 5.0000 mg | Freq: Three times a day (TID) | INTRAMUSCULAR | Status: DC | PRN

## 2018-12-20 MED ORDER — MAGNESIUM CITRATE PO SOLN: 1.0000 | Freq: Once | ORAL | Status: DC | PRN

## 2018-12-20 MED ORDER — PROPOFOL 10 MG/ML IV BOLUS
INTRAVENOUS | Status: AC
  Filled 2018-12-20: qty 60

## 2018-12-20 MED ORDER — SODIUM CHLORIDE 0.9 % IV SOLN
INTRAVENOUS | Status: DC
  Administered 2018-12-20 – 2018-12-21 (×2): via INTRAVENOUS

## 2018-12-20 MED ORDER — METHOCARBAMOL 500 MG IVPB - SIMPLE MED
INTRAVENOUS | Status: AC
  Filled 2018-12-20: qty 50

## 2018-12-20 MED ORDER — BISACODYL 10 MG RE SUPP: 10.0000 mg | Freq: Every day | RECTAL | Status: DC | PRN

## 2018-12-20 MED ORDER — TRANEXAMIC ACID-NACL 1000-0.7 MG/100ML-% IV SOLN
1000.0000 mg | Freq: Once | INTRAVENOUS | Status: AC
  Administered 2018-12-20: 1000 mg via INTRAVENOUS
  Filled 2018-12-20: qty 100

## 2018-12-20 MED ORDER — TRANEXAMIC ACID-NACL 1000-0.7 MG/100ML-% IV SOLN
1000.0000 mg | INTRAVENOUS | Status: AC
  Administered 2018-12-20: 1000 mg via INTRAVENOUS
  Filled 2018-12-20: qty 100

## 2018-12-20 MED ORDER — LACTATED RINGERS IV SOLN
INTRAVENOUS | Status: DC
  Administered 2018-12-20 (×2): via INTRAVENOUS

## 2018-12-20 MED ORDER — DEXAMETHASONE SODIUM PHOSPHATE 10 MG/ML IJ SOLN
10.0000 mg | Freq: Once | INTRAMUSCULAR | Status: AC
  Administered 2018-12-21: 10 mg via INTRAVENOUS
  Filled 2018-12-20: qty 1

## 2018-12-20 MED ORDER — ALPRAZOLAM 0.5 MG PO TABS: 0.5000 mg | ORAL_TABLET | ORAL | Status: DC | PRN

## 2018-12-20 MED ORDER — SODIUM CHLORIDE 0.9 % IR SOLN
Status: DC | PRN
  Administered 2018-12-20: 1000 mL

## 2018-12-20 MED ORDER — POTASSIUM CHLORIDE CRYS ER 20 MEQ PO TBCR
20.0000 meq | EXTENDED_RELEASE_TABLET | Freq: Two times a day (BID) | ORAL | Status: DC
  Administered 2018-12-20 – 2018-12-22 (×4): 20 meq via ORAL
  Filled 2018-12-20 (×4): qty 1

## 2018-12-20 MED ORDER — DOCUSATE SODIUM 100 MG PO CAPS
100.0000 mg | ORAL_CAPSULE | Freq: Two times a day (BID) | ORAL | Status: DC
  Administered 2018-12-20 – 2018-12-22 (×4): 100 mg via ORAL
  Filled 2018-12-20 (×4): qty 1

## 2018-12-20 MED ORDER — FLUOXETINE HCL 20 MG PO CAPS
20.0000 mg | ORAL_CAPSULE | Freq: Every day | ORAL | Status: DC
  Administered 2018-12-21 – 2018-12-22 (×2): 20 mg via ORAL
  Filled 2018-12-20 (×2): qty 1

## 2018-12-20 MED ORDER — METHOCARBAMOL 500 MG IVPB - SIMPLE MED
500.0000 mg | Freq: Four times a day (QID) | INTRAVENOUS | Status: DC | PRN
  Administered 2018-12-20: 500 mg via INTRAVENOUS
  Filled 2018-12-20: qty 50

## 2018-12-20 MED ORDER — BUPIVACAINE IN DEXTROSE 0.75-8.25 % IT SOLN
INTRATHECAL | Status: DC | PRN
  Administered 2018-12-20: 2 mL via INTRATHECAL

## 2018-12-20 MED ORDER — CILIDINIUM-CHLORDIAZEPOXIDE 2.5-5 MG PO CAPS
1.0000 | ORAL_CAPSULE | Freq: Two times a day (BID) | ORAL | Status: DC | PRN
  Filled 2018-12-20: qty 1

## 2018-12-20 MED ORDER — FERROUS SULFATE 325 (65 FE) MG PO TABS
325.0000 mg | ORAL_TABLET | Freq: Three times a day (TID) | ORAL | Status: DC
  Administered 2018-12-22 (×2): 325 mg via ORAL
  Filled 2018-12-20 (×3): qty 1

## 2018-12-20 MED ORDER — ONDANSETRON HCL 4 MG/2ML IJ SOLN
INTRAMUSCULAR | Status: AC
  Filled 2018-12-20: qty 6

## 2018-12-20 MED ORDER — COLESTIPOL HCL 1 G PO TABS
1.0000 g | ORAL_TABLET | ORAL | Status: DC
  Administered 2018-12-21: 1 g via ORAL
  Filled 2018-12-20 (×4): qty 1

## 2018-12-20 MED ORDER — CEFAZOLIN SODIUM-DEXTROSE 2-4 GM/100ML-% IV SOLN
2.0000 g | INTRAVENOUS | Status: AC
  Administered 2018-12-20: 2 g via INTRAVENOUS
  Filled 2018-12-20: qty 100

## 2018-12-20 MED ORDER — POVIDONE-IODINE 10 % EX SWAB
2.0000 "application " | Freq: Once | CUTANEOUS | Status: AC
  Administered 2018-12-20: 2 via TOPICAL

## 2018-12-20 MED ORDER — UMECLIDINIUM-VILANTEROL 62.5-25 MCG/INH IN AEPB
1.0000 | INHALATION_SPRAY | Freq: Every day | RESPIRATORY_TRACT | Status: DC
  Administered 2018-12-21 – 2018-12-22 (×2): 1 via RESPIRATORY_TRACT
  Filled 2018-12-20: qty 14

## 2018-12-20 MED ORDER — FENTANYL CITRATE (PF) 100 MCG/2ML IJ SOLN
25.0000 ug | INTRAMUSCULAR | Status: DC | PRN
  Administered 2018-12-20 (×3): 50 ug via INTRAVENOUS

## 2018-12-20 MED ORDER — BENZONATATE 100 MG PO CAPS: 200.0000 mg | ORAL_CAPSULE | Freq: Three times a day (TID) | ORAL | Status: DC | PRN

## 2018-12-20 MED ORDER — HYDROCODONE-ACETAMINOPHEN 7.5-325 MG PO TABS
1.0000 | ORAL_TABLET | ORAL | Status: DC | PRN
  Administered 2018-12-20 (×2): 2 via ORAL
  Administered 2018-12-20: 1 via ORAL
  Administered 2018-12-21 (×4): 2 via ORAL
  Administered 2018-12-21 (×2): 1 via ORAL
  Administered 2018-12-22 (×3): 2 via ORAL
  Filled 2018-12-20 (×3): qty 2
  Filled 2018-12-20: qty 1
  Filled 2018-12-20 (×7): qty 2

## 2018-12-20 MED ORDER — METHOCARBAMOL 500 MG PO TABS
500.0000 mg | ORAL_TABLET | Freq: Four times a day (QID) | ORAL | Status: DC | PRN
  Administered 2018-12-20 – 2018-12-22 (×7): 500 mg via ORAL
  Filled 2018-12-20 (×7): qty 1

## 2018-12-20 MED ORDER — ACETAMINOPHEN 325 MG PO TABS: 325.0000 mg | ORAL_TABLET | Freq: Four times a day (QID) | ORAL | Status: DC | PRN

## 2018-12-20 MED ORDER — FENTANYL CITRATE (PF) 100 MCG/2ML IJ SOLN
INTRAMUSCULAR | Status: DC | PRN
  Administered 2018-12-20 (×2): 50 ug via INTRAVENOUS
  Administered 2018-12-20 (×2): 25 ug via INTRAVENOUS

## 2018-12-20 MED ORDER — KETAMINE HCL 10 MG/ML IJ SOLN
INTRAMUSCULAR | Status: DC | PRN
  Administered 2018-12-20 (×5): 10 mg via INTRAVENOUS

## 2018-12-20 SURGICAL SUPPLY — 42 items
ADH SKN CLS APL DERMABOND .7 (GAUZE/BANDAGES/DRESSINGS) ×1
BLADE SAG 18X100X1.27 (BLADE) ×2 IMPLANT
BLADE SURG SZ10 CARB STEEL (BLADE) ×4 IMPLANT
COVER PERINEAL POST (MISCELLANEOUS) ×2 IMPLANT
COVER SURGICAL LIGHT HANDLE (MISCELLANEOUS) ×2 IMPLANT
CUP ACET PINNACLE SECTR 50MM (Hips) IMPLANT
DERMABOND ADVANCED (GAUZE/BANDAGES/DRESSINGS) ×1
DERMABOND ADVANCED .7 DNX12 (GAUZE/BANDAGES/DRESSINGS) ×1 IMPLANT
DRAPE STERI IOBAN 125X83 (DRAPES) ×2 IMPLANT
DRAPE U-SHAPE 47X51 STRL (DRAPES) ×4 IMPLANT
DRESSING AQUACEL AG SP 3.5X10 (GAUZE/BANDAGES/DRESSINGS) ×1 IMPLANT
DRSG AQUACEL AG SP 3.5X10 (GAUZE/BANDAGES/DRESSINGS) ×2
DURAPREP 26ML APPLICATOR (WOUND CARE) ×2 IMPLANT
ELECT BLADE TIP CTD 4 INCH (ELECTRODE) ×2 IMPLANT
ELECT REM PT RETURN 15FT ADLT (MISCELLANEOUS) ×2 IMPLANT
ELIMINATOR HOLE APEX DEPUY (Hips) ×1 IMPLANT
GLOVE BIOGEL PI IND STRL 7.5 (GLOVE) ×1 IMPLANT
GLOVE BIOGEL PI IND STRL 8.5 (GLOVE) ×1 IMPLANT
GLOVE BIOGEL PI INDICATOR 7.5 (GLOVE) ×1
GLOVE BIOGEL PI INDICATOR 8.5 (GLOVE) ×1
GLOVE ECLIPSE 8.0 STRL XLNG CF (GLOVE) ×4 IMPLANT
GLOVE ORTHO TXT STRL SZ7.5 (GLOVE) ×2 IMPLANT
GOWN STRL REUS W/TWL 2XL LVL3 (GOWN DISPOSABLE) ×2 IMPLANT
GOWN STRL REUS W/TWL LRG LVL3 (GOWN DISPOSABLE) ×2 IMPLANT
HEAD FEM STD 32X+1 STRL (Hips) ×1 IMPLANT
HOLDER FOLEY CATH W/STRAP (MISCELLANEOUS) ×2 IMPLANT
KIT TURNOVER KIT A (KITS) IMPLANT
LINER ACET PNNCL PLUS4 NEUTRAL (Hips) IMPLANT
PACK ANTERIOR HIP CUSTOM (KITS) ×2 IMPLANT
PINNACLE PLUS 4 NEUTRAL (Hips) ×2 IMPLANT
PINNACLE SECTOR CUP 50MM (Hips) ×2 IMPLANT
SCREW 6.5MMX25MM (Screw) ×1 IMPLANT
STEM FEMORAL SZ5 HIGH ACTIS (Nail) ×1 IMPLANT
SUT MNCRL AB 4-0 PS2 18 (SUTURE) ×2 IMPLANT
SUT STRATAFIX 0 PDS 27 VIOLET (SUTURE) ×2
SUT VIC AB 1 CT1 36 (SUTURE) ×6 IMPLANT
SUT VIC AB 2-0 CT1 27 (SUTURE) ×6
SUT VIC AB 2-0 CT1 TAPERPNT 27 (SUTURE) ×2 IMPLANT
SUTURE STRATFX 0 PDS 27 VIOLET (SUTURE) ×1 IMPLANT
TRAY FOLEY MTR SLVR 16FR STAT (SET/KITS/TRAYS/PACK) IMPLANT
WATER STERILE IRR 1000ML POUR (IV SOLUTION) ×2 IMPLANT
YANKAUER SUCT BULB TIP 10FT TU (MISCELLANEOUS) ×1 IMPLANT

## 2018-12-20 NOTE — Progress Notes (Signed)
PT Cancellation Note  Patient Details Name: [name] MRN: [id number] DOB: 06-26-1946   Cancelled Treatment:     PT eval attempted but deferred - pt with ++ pain with all attempts to move.  Will follow in am.   Aquarius Tremper 12/20/2018, 4:46 PM

## 2018-12-20 NOTE — Transfer of Care (Signed)
Immediate Anesthesia Transfer of Care Note  Patient: [name]  Procedure(s) Performed: Procedure(s) with comments: TOTAL HIP ARTHROPLASTY ANTERIOR APPROACH (Right) - 70 mins  Patient Location: PACU  Anesthesia Type:Spinal  Level of Consciousness:  sedated, patient cooperative and responds to stimulation  Airway & Oxygen Therapy:Patient Spontanous Breathing and Patient connected to face mask oxgen  Post-op Assessment:  Report given to PACU RN and Post -op Vital signs reviewed and stable  Post vital signs:  Reviewed and stable  Last Vitals:  Vitals:   12/20/18 0735 12/20/18 0805  BP: (!) 173/79   Pulse: (!) 101 90  Resp: 14   Temp: 36.7 C   SpO2: 94% 496%    Complications: No apparent anesthesia complications

## 2018-12-20 NOTE — Plan of Care (Signed)

## 2018-12-20 NOTE — Discharge Instructions (Signed)
INSTRUCTIONS AFTER JOINT REPLACEMENT  ° °o Remove items at home which could result in a fall. This includes throw rugs or furniture in walking pathways °o ICE to the affected joint every three hours while awake for 30 minutes at a time, for at least the first 3-5 days, and then as needed for pain and swelling.  Continue to use ice for pain and swelling. You may notice swelling that will progress down to the foot and ankle.  This is normal after surgery.  Elevate your leg when you are not up walking on it.   °o Continue to use the breathing machine you got in the hospital (incentive spirometer) which will help keep your temperature down.  It is common for your temperature to cycle up and down following surgery, especially at night when you are not up moving around and exerting yourself.  The breathing machine keeps your lungs expanded and your temperature down. ° ° °DIET:  As you were doing prior to hospitalization, we recommend a well-balanced diet. ° °DRESSING / WOUND CARE / SHOWERING ° °Keep the surgical dressing until follow up.  The dressing is water proof, so you can shower without any extra covering.  IF THE DRESSING FALLS OFF or the wound gets wet inside, change the dressing with sterile gauze.  Please use good hand washing techniques before changing the dressing.  Do not use any lotions or creams on the incision until instructed by your surgeon.   ° °ACTIVITY ° °o Increase activity slowly as tolerated, but follow the weight bearing instructions below.   °o No driving for 6 weeks or until further direction given by your physician.  You cannot drive while taking narcotics.  °o No lifting or carrying greater than 10 lbs. until further directed by your surgeon. °o Avoid periods of inactivity such as sitting longer than an hour when not asleep. This helps prevent blood clots.  °o You may return to work once you are authorized by your doctor.  ° ° ° °WEIGHT BEARING  ° °Weight bearing as tolerated with assist  device (walker, cane, etc) as directed, use it as long as suggested by your surgeon or therapist, typically at least 4-6 weeks. ° ° °EXERCISES ° °Results after joint replacement surgery are often greatly improved when you follow the exercise, range of motion and muscle strengthening exercises prescribed by your doctor. Safety measures are also important to protect the joint from further injury. Any time any of these exercises cause you to have increased pain or swelling, decrease what you are doing until you are comfortable again and then slowly increase them. If you have problems or questions, call your caregiver or physical therapist for advice.  ° °Rehabilitation is important following a joint replacement. After just a few days of immobilization, the muscles of the leg can become weakened and shrink (atrophy).  These exercises are designed to build up the tone and strength of the thigh and leg muscles and to improve motion. Often times heat used for twenty to thirty minutes before working out will loosen up your tissues and help with improving the range of motion but do not use heat for the first two weeks following surgery (sometimes heat can increase post-operative swelling).  ° °These exercises can be done on a training (exercise) mat, on the floor, on a table or on a bed. Use whatever works the best and is most comfortable for you.    Use music or television while you are exercising so that   the exercises are a pleasant break in your day. This will make your life better with the exercises acting as a break in your routine that you can look forward to.   Perform all exercises about fifteen times, three times per day or as directed.  You should exercise both the operative leg and the other leg as well. ° °Exercises include: °  °• Quad Sets - Tighten up the muscle on the front of the thigh (Quad) and hold for 5-10 seconds.   °• Straight Leg Raises - With your knee straight (if you were given a brace, keep it on),  lift the leg to 60 degrees, hold for 3 seconds, and slowly lower the leg.  Perform this exercise against resistance later as your leg gets stronger.  °• Leg Slides: Lying on your back, slowly slide your foot toward your buttocks, bending your knee up off the floor (only go as far as is comfortable). Then slowly slide your foot back down until your leg is flat on the floor again.  °• Angel Wings: Lying on your back spread your legs to the side as far apart as you can without causing discomfort.  °• Hamstring Strength:  Lying on your back, push your heel against the floor with your leg straight by tightening up the muscles of your buttocks.  Repeat, but this time bend your knee to a comfortable angle, and push your heel against the floor.  You may put a pillow under the heel to make it more comfortable if necessary.  ° °A rehabilitation program following joint replacement surgery can speed recovery and prevent re-injury in the future due to weakened muscles. Contact your doctor or a physical therapist for more information on knee rehabilitation.  ° ° °CONSTIPATION ° °Constipation is defined medically as fewer than three stools per week and severe constipation as less than one stool per week.  Even if you have a regular bowel pattern at home, your normal regimen is likely to be disrupted due to multiple reasons following surgery.  Combination of anesthesia, postoperative narcotics, change in appetite and fluid intake all can affect your bowels.  ° °YOU MUST use at least one of the following options; they are listed in order of increasing strength to get the job done.  They are all available over the counter, and you may need to use some, POSSIBLY even all of these options:   ° °Drink plenty of fluids (prune juice may be helpful) and high fiber foods °Colace 100 mg by mouth twice a day  °Senokot for constipation as directed and as needed Dulcolax (bisacodyl), take with full glass of water  °Miralax (polyethylene glycol)  once or twice a day as needed. ° °If you have tried all these things and are unable to have a bowel movement in the first 3-4 days after surgery call either your surgeon or your primary doctor.   ° °If you experience loose stools or diarrhea, hold the medications until you stool forms back up.  If your symptoms do not get better within 1 week or if they get worse, check with your doctor.  If you experience "the worst abdominal pain ever" or develop nausea or vomiting, please contact the office immediately for further recommendations for treatment. ° ° °ITCHING:  If you experience itching with your medications, try taking only a single pain pill, or even half a pain pill at a time.  You can also use Benadryl over the counter for itching or also to   help with sleep.   TED HOSE STOCKINGS:  Use stockings on both legs until for at least 2 weeks or as directed by physician office. They may be removed at night for sleeping.  MEDICATIONS:  See your medication summary on the After Visit Summary that nursing will review with you.  You may have some home medications which will be placed on hold until you complete the course of blood thinner medication.  It is important for you to complete the blood thinner medication as prescribed.  PRECAUTIONS:  If you experience chest pain or shortness of breath - call 911 immediately for transfer to the hospital emergency department.   If you develop a fever greater that 101 F, purulent drainage from wound, increased redness or drainage from wound, foul odor from the wound/dressing, or calf pain - CONTACT YOUR SURGEON.                                                   FOLLOW-UP APPOINTMENTS:  If you do not already have a post-op appointment, please call the office for an appointment to be seen by your surgeon.  Guidelines for how soon to be seen are listed in your After Visit Summary, but are typically between 1-4 weeks after surgery.  OTHER INSTRUCTIONS:   Knee  Replacement:  Do not place pillow under knee, focus on keeping the knee straight while resting.   MAKE SURE YOU:   Understand these instructions.   Get help right away if you are not doing well or get worse.    Thank you for letting us be a part of your medical care team.  It is a privilege we respect greatly.  We hope these instructions will help you stay on track for a fast and full recovery!

## 2018-12-20 NOTE — Op Note (Signed)
NAME:  [name]                ACCOUNT NO.: [id number]      MEDICAL RECORD NO.: [id number]      FACILITY:  [facility]      PHYSICIAN:  [name]  DATE OF BIRTH:  [birth year]     DATE OF PROCEDURE:  12/20/2018                                 OPERATIVE REPORT         PREOPERATIVE DIAGNOSIS: Right  hip osteoarthritis.      POSTOPERATIVE DIAGNOSIS:  Right hip osteoarthritis.      PROCEDURE:  Right total hip replacement through an anterior approach   utilizing DePuy THR system, component size 72m pinnacle cup, a size 32+4 neutral   Altrex liner, a size 5 Hi Actis stem with a 32+1 Articuleze metal head ball.      SURGEON:  MPietro Cassis OAlvan Dame M.D.      ASSISTANT:  AGriffith Citron PA-C     ANESTHESIA:  Spinal.      SPECIMENS:  None.      COMPLICATIONS:  None.      BLOOD LOSS:  300 cc     DRAINS:  None.      INDICATION OF THE PROCEDURE:  Hailey ELLIOTTis a 73y.o. female who had   presented to office for evaluation of right hip pain.  Radiographic evaluations (including MRI) initially revealed concerns for stress reaction along medial neck as well as moderate degenerative changes of labrum and articular surfaces.  In follow up plain films indicated non-displaced basi-cervical femoral neck fracture.  The patient had a significant painful limited range of   motion and inability to bear weight without pain. This significantly affected her overall quality of life and function.  Consent was obtained for total hip replacement to address all of the above and to improve pain.  Specific risks of infection, DVT, component   failure, dislocation, neurovascular injury, and need for revision surgery were reviewed in the office as well discussion of   the anterior versus posterior approach were reviewed.     PROCEDURE IN DETAIL:  The patient was brought to operative theater.   Once adequate anesthesia, preoperative antibiotics, 2 gm of Ancef, 1 gm of Tranexamic Acid,  and 10 mg of Decadron were administered, the patient was positioned supine on the OAtmos Energytable.  Once the patient was safely positioned with adequate padding of boney prominences we predraped out the hip, and used fluoroscopy to confirm orientation of the pelvis.      The right hip was then prepped and draped from proximal iliac crest to   mid thigh with a shower curtain technique.      Time-out was performed identifying the patient, planned procedure, and the appropriate extremity.     An incision was then made 2 cm lateral to the   anterior superior iliac spine extending over the orientation of the   tensor fascia lata muscle and sharp dissection was carried down to the   fascia of the muscle.      The fascia was then incised.  The muscle belly was identified and swept   laterally and retractor placed along the superior neck.  Following   cauterization of the circumflex vessels and removing some pericapsular   fat, a second  cobra retractor was placed on the inferior neck.  A T-capsulotomy was made along the line of the   superior neck to the trochanteric fossa, then extended proximally and   distally.  Tag sutures were placed and the retractors were then placed   intracapsular.  We then identified the trochanteric fossa and   orientation of my neck cut and then made a neck osteotomy with the femur on traction.  The femoral   head was removed without difficulty or complication.  Traction was let   off and retractors were placed posterior and anterior around the   acetabulum.      The labrum and foveal tissue were debrided.  I began reaming with a 45 mm   reamer and reamed up to 49 mm reamer with good bony bed preparation and a 50 mm  cup was chosen.  The final 50 mm Pinnacle cup was then impacted under fluoroscopy to confirm the depth of penetration and orientation with respect to   Abduction and forward flexion.  A screw was placed into the ilium followed by the hole eliminator.  The  final   32+4 neutral Altrex liner was impacted with good visualized rim fit.  The cup was positioned anatomically within the acetabular portion of the pelvis.      At this point, the femur was rolled to 100 degrees.  Further capsule was   released off the inferior aspect of the femoral neck.  I then   released the superior capsule proximally.  With the leg in a neutral position the hook was placed laterally   along the femur under the vastus lateralis origin and elevated manually and then held in position using the hook attachment on the bed.  The leg was then extended and adducted with the leg rolled to 100   degrees of external rotation.  Retractors were placed along the medial calcar and posteriorly over the greater trochanter.  Once the proximal femur was fully   exposed, I used a box osteotome to set orientation.  I then began   broaching with the starting chili pepper broach and passed this by hand and then broached up to 5.  With the 5 broach in place I first milled to further remove femoral neck bone and fragments from her fracture region then chose a high offset neck and did several trial reductions.  The offset was appropriate, leg lengths   appeared to be equal best matched with the +1 head ball trial confirmed radiographically.   Given these findings, I went ahead and dislocated the hip, repositioned all   retractors and positioned the right hip in the extended and abducted position.  The final 5 Hi Actis stem was   chosen and it was impacted down to the level of neck cut.  Based on this   and the trial reductions, a final 32+1 Articuleze metal head ball was chosen and   impacted onto a clean and dry trunnion, and the hip was reduced.  The   hip had been irrigated throughout the case again at this point.  I did   reapproximate the superior capsular leaflet to the anterior leaflet   using #1 Vicryl.  The fascia of the   tensor fascia lata muscle was then reapproximated using #1 Vicryl  and #0 Stratafix sutures.  The   remaining wound was closed with 2-0 Vicryl and running 4-0 Monocryl.   The hip was cleaned, dried, and dressed sterilely using Dermabond and   Aquacel  dressing.  The patient was then brought   to recovery room in stable condition tolerating the procedure well.    Griffith Citron, PA-C was present for the entirety of the case involved from   preoperative positioning, perioperative retractor management, general   facilitation of the case, as well as primary wound closure as assistant.            Pietro Cassis Alvan Dame, M.D.        12/20/2018 11:49 AM

## 2018-12-20 NOTE — Anesthesia Procedure Notes (Signed)
Spinal  Patient location during procedure: OR Start time: 12/20/2018 10:10 AM End time: 12/20/2018 10:20 AM Staffing Anesthesiologist: Murvin Natal, MD Performed: anesthesiologist  Preanesthetic Checklist Completed: patient identified, surgical consent, pre-op evaluation, timeout performed, IV checked, risks and benefits discussed and monitors and equipment checked Spinal Block Patient position: sitting Prep: DuraPrep Patient monitoring: cardiac monitor, continuous pulse ox and blood pressure Approach: midline Location: L5-S1 Injection technique: single-shot Needle Needle type: Pencan  Needle gauge: 24 G Needle length: 9 cm Assessment Sensory level: T10 Additional Notes Functioning IV was confirmed and monitors were applied. Sterile prep and drape, including hand hygiene and sterile gloves were used. The patient was positioned and the spine was prepped. The skin was anesthetized with lidocaine.  Free flow of clear CSF was obtained prior to injecting local anesthetic into the CSF.  The spinal needle aspirated freely following injection.  The needle was carefully withdrawn.  The patient tolerated the procedure well.

## 2018-12-20 NOTE — Anesthesia Postprocedure Evaluation (Signed)
Anesthesia Post Note  Patient: [name]  Procedure(s) Performed: TOTAL HIP ARTHROPLASTY ANTERIOR APPROACH (Right Hip)     Patient location during evaluation: PACU Anesthesia Type: Spinal Level of consciousness: oriented and awake and alert Pain management: pain level controlled Vital Signs Assessment: post-procedure vital signs reviewed and stable Respiratory status: spontaneous breathing, respiratory function stable and patient connected to nasal cannula oxygen Cardiovascular status: blood pressure returned to baseline and stable Postop Assessment: no headache, no backache, no apparent nausea or vomiting and spinal receding Anesthetic complications: no    Last Vitals:  Vitals:   12/20/18 1555 12/20/18 1647  BP: (!) 146/72 (!) 143/75  Pulse: 78 81  Resp: 18 18  Temp: 36.7 C 37 C  SpO2: 97% 94%    Last Pain:  Vitals:   12/20/18 1647  TempSrc: Oral  PainSc:                  [name]

## 2018-12-20 NOTE — Addendum Note (Signed)
Encounter addended by: Ivonne Andrew, RD on: 12/20/2018 2:39 PM  Actions taken: Flowsheet data copied forward, Visit Navigator Flowsheet section accepted

## 2018-12-20 NOTE — Interval H&P Note (Signed)
History and Physical Interval Note:  12/20/2018 8:49 AM  [name]  has presented today for surgery, with the diagnosis of Right hip osteoarthritis, troch fracture.  The various methods of treatment have been discussed with the patient and family. After consideration of risks, benefits and other options for treatment, the patient has consented to  Procedure(s) with comments: TOTAL HIP ARTHROPLASTY ANTERIOR APPROACH (Right) - 70 mins as a surgical intervention.  The patient's history has been reviewed, patient examined, no change in status, stable for surgery.  I have reviewed the patient's chart and labs.  Questions were answered to the patient's satisfaction.     [name]

## 2018-12-21 ENCOUNTER — Encounter (HOSPITAL_COMMUNITY): Payer: Self-pay | Admitting: Orthopedic Surgery

## 2018-12-21 DIAGNOSIS — E669 Obesity, unspecified: Secondary | ICD-10-CM | POA: Diagnosis present

## 2018-12-21 DIAGNOSIS — M1611 Unilateral primary osteoarthritis, right hip: Secondary | ICD-10-CM | POA: Diagnosis not present

## 2018-12-21 LAB — BASIC METABOLIC PANEL
Anion gap: 5 (ref 5–15)
BUN: 17 mg/dL (ref 8–23)
CO2: 28 mmol/L (ref 22–32)
Calcium: 8.5 mg/dL — ABNORMAL LOW (ref 8.9–10.3)
Chloride: 108 mmol/L (ref 98–111)
Creatinine, Ser: 0.93 mg/dL (ref 0.44–1.00)
GFR calc Af Amer: >60 mL/min (ref >60)
GFR calc non Af Amer: >60 mL/min (ref >60)
Glucose, Bld: 143 mg/dL — ABNORMAL HIGH (ref 70–99)
Potassium: 3.8 mmol/L (ref 3.5–5.1)
Sodium: 141 mmol/L (ref 135–145)

## 2018-12-21 LAB — CBC
HCT: 31.7 % — ABNORMAL LOW (ref 36.0–46.0)
Hemoglobin: 9.4 g/dL — ABNORMAL LOW (ref 12.0–15.0)
MCH: 29.8 pg (ref 26.0–34.0)
MCHC: 29.7 g/dL — ABNORMAL LOW (ref 30.0–36.0)
MCV: 100.6 fL — ABNORMAL HIGH (ref 80.0–100.0)
Platelets: 187 10*3/uL (ref 150–400)
RBC: 3.15 MIL/uL — ABNORMAL LOW (ref 3.87–5.11)
RDW: 14.2 % (ref 11.5–15.5)
WBC: 10.7 10*3/uL — ABNORMAL HIGH (ref 4.0–10.5)
nRBC: 0 % (ref 0.0–0.2)

## 2018-12-21 MED ORDER — POLYETHYLENE GLYCOL 3350 17 G PO PACK: 17.0000 g | PACK | Freq: Two times a day (BID) | ORAL | 0 refills | Status: DC

## 2018-12-21 MED ORDER — ASPIRIN 81 MG PO CHEW: 81.0000 mg | CHEWABLE_TABLET | Freq: Two times a day (BID) | ORAL | 0 refills | Status: AC

## 2018-12-21 MED ORDER — HYDROCODONE-ACETAMINOPHEN 7.5-325 MG PO TABS: 1.0000 | ORAL_TABLET | ORAL | 0 refills | Status: DC | PRN

## 2018-12-21 MED ORDER — METHOCARBAMOL 500 MG PO TABS: 500.0000 mg | ORAL_TABLET | Freq: Four times a day (QID) | ORAL | 0 refills | Status: DC | PRN

## 2018-12-21 MED ORDER — FERROUS SULFATE 325 (65 FE) MG PO TABS: 325.0000 mg | ORAL_TABLET | Freq: Three times a day (TID) | ORAL | 3 refills | Status: DC

## 2018-12-21 MED ORDER — HYDROXYCHLOROQUINE SULFATE 200 MG PO TABS: 400.0000 mg | ORAL_TABLET | Freq: Every day | ORAL | Status: DC

## 2018-12-21 MED ORDER — DOCUSATE SODIUM 100 MG PO CAPS: 100.0000 mg | ORAL_CAPSULE | Freq: Two times a day (BID) | ORAL | 0 refills | Status: DC

## 2018-12-21 NOTE — Progress Notes (Signed)
Physical Therapy Treatment Patient Details Name: [name] MRN: [id number] DOB: [birth year] Today's Date: 12/21/2018    History of Present Illness Pt s/p R THR following hip fx.  Pt with hx of RA, Bil TKR, back surgery, multiple L foot surgeries, and L ankle fusion    PT Comments    Pt continues cooperative and progressing slowly with mobility but with increased time required for all tasks and fatigues easily.   Follow Up Recommendations  Home health PT;Follow surgeon's recommendation for DC plan and follow-up therapies     Equipment Recommendations  None recommended by PT    Recommendations for Other Services OT consult     Precautions / Restrictions Precautions Precautions: Fall Restrictions Weight Bearing Restrictions: No Other Position/Activity Restrictions: WBAT    Mobility  Bed Mobility Overal bed mobility: Needs Assistance Bed Mobility: Sit to Supine       Sit to supine: Min assist;+2 for physical assistance;+2 for safety/equipment   General bed mobility comments: increased time with cues for sequence and use of L LE to self assist.  Physical assist to manage R LE and to control trunk  Transfers Overall transfer level: Needs assistance Equipment used: Rolling walker (2 wheeled) Transfers: Sit to/from Stand Sit to Stand: Min assist;+2 physical assistance;+2 safety/equipment         General transfer comment: cues for LE management and use of UEs to self assist  Ambulation/Gait Ambulation/Gait assistance: Min assist;Mod assist;+2 safety/equipment Gait Distance (Feet): 14 Feet Assistive device: Rolling walker (2 wheeled) Gait Pattern/deviations: Step-to pattern;Decreased step length - right;Decreased step length - left;Shuffle;Trunk flexed Gait velocity: decr   General Gait Details: Cues for sequence, posture and position from RW.  Pt allowing min WB on R LE. Distance ltd by fatigue   Stairs             Wheelchair Mobility    Modified  Rankin (Stroke Patients Only)       Balance Overall balance assessment: Needs assistance Sitting-balance support: Feet supported;No upper extremity supported Sitting balance-Leahy Scale: Good     Standing balance support: Bilateral upper extremity supported Standing balance-Leahy Scale: Poor Standing balance comment: Pt reliant on UE support                             Cognition Arousal/Alertness: Awake/alert Behavior During Therapy: WFL for tasks assessed/performed Overall Cognitive Status: Within Functional Limits for tasks assessed                                        Exercises      General Comments        Pertinent Vitals/Pain Pain Assessment: 0-10 Pain Score: 4  Pain Location: R hip Pain Descriptors / Indicators: Aching;Sore Pain Intervention(s): Limited activity within patient's tolerance;Monitored during session;Premedicated before session;Ice applied    Home Living                      Prior Function            PT Goals (current goals can now be found in the care plan section) Acute Rehab PT Goals Patient Stated Goal: Walk again PT Goal Formulation: With patient Time For Goal Achievement: 01/04/19 Potential to Achieve Goals: Fair Progress towards PT goals: Progressing toward goals    Frequency    7X/week  PT Plan Current plan remains appropriate    Co-evaluation              AM-PAC PT "6 Clicks" Mobility   Outcome Measure  Help needed turning from your back to your side while in a flat bed without using bedrails?: A Lot Help needed moving from lying on your back to sitting on the side of a flat bed without using bedrails?: A Lot Help needed moving to and from a bed to a chair (including a wheelchair)?: A Lot Help needed standing up from a chair using your arms (e.g., wheelchair or bedside chair)?: A Lot Help needed to walk in hospital room?: A Lot Help needed climbing 3-5 steps with a  railing? : Total 6 Click Score: 11    End of Session Equipment Utilized During Treatment: Gait belt;Oxygen Activity Tolerance: Patient limited by fatigue Patient left: in bed;with call bell/phone within reach;with bed alarm set Nurse Communication: Mobility status PT Visit Diagnosis: History of falling (Z91.81);Difficulty in walking, not elsewhere classified (R26.2);Pain Pain - Right/Left: Right Pain - part of body: Hip     Time: 9532-0233 PT Time Calculation (min) (ACUTE ONLY): 24 min  Charges:  $Gait Training: 8-22 mins $Therapeutic Activity: 8-22 mins                     [place] Pager [phone] Office [phone]    Nikoletta Varma 12/21/2018, 4:49 PM

## 2018-12-21 NOTE — Evaluation (Signed)
Physical Therapy Evaluation Patient Details Name: [name] MRN: [id number] DOB: Dec 18, 1945 Today's Date: 12/21/2018   History of Present Illness  Pt s/p R THR following hip fx.  Pt with hx of RA, Bil TKR, back surgery, multiple L foot surgeries, and L ankle fusion  Clinical Impression  Pt admitted as above and presenting with functional mobility limitations 2* decreased R LE strength/ROM, post op pain, obesity, and general deconditioning.  Pt hopes to progress to dc home with assist of spouse.    Follow Up Recommendations Home health PT;Follow surgeon's recommendation for DC plan and follow-up therapies    Equipment Recommendations  None recommended by PT    Recommendations for Other Services OT consult     Precautions / Restrictions Precautions Precautions: Fall Restrictions Weight Bearing Restrictions: No Other Position/Activity Restrictions: WBAT      Mobility  Bed Mobility               General bed mobility comments: OOB deferred to arrival of additional assist  Transfers                    Ambulation/Gait                Stairs            Wheelchair Mobility    Modified Rankin (Stroke Patients Only)       Balance                                             Pertinent Vitals/Pain Pain Assessment: 0-10 Pain Score: 4  Pain Location: R hip Pain Descriptors / Indicators: Aching;Sore Pain Intervention(s): Limited activity within patient's tolerance;Monitored during session;Premedicated before session;Ice applied    Home Living Family/patient expects to be discharged to:: Private residence Living Arrangements: Spouse/significant other Available Help at Discharge: Family Type of Home: House Home Access: [place]: One [place]: Environmental consultant - 2 wheels;Bedside commode;Wheelchair - manual Additional Comments: has sleep number bed    Prior Function Level of Independence: Needs  assistance   Gait / Transfers Assistance Needed: RW but largely in [place] since hip fx  ADL's / Homemaking Assistance Needed: assist of spouse        Hand Dominance   Dominant Hand: Right    Extremity/Trunk Assessment   Upper Extremity Assessment Upper Extremity Assessment: Generalized weakness    Lower Extremity Assessment Lower Extremity Assessment: Generalized weakness;RLE deficits/detail RLE Deficits / Details: 2/5 strength at hip with AAROM at hip to 85 flex and 15 abd       Communication   Communication: No difficulties  Cognition Arousal/Alertness: Awake/alert Behavior During Therapy: WFL for tasks assessed/performed Overall Cognitive Status: Within Functional Limits for tasks assessed                                        General Comments      Exercises Total Joint Exercises Ankle Circles/Pumps: AROM;Both;15 reps;Supine Quad Sets: AROM;Both;10 reps;Supine Heel Slides: AAROM;Right;20 reps;Supine Hip ABduction/ADduction: AAROM;Right;15 reps;Supine   Assessment/Plan    PT Assessment Patient needs continued PT services  PT Problem List Decreased strength;Decreased range of motion;Decreased activity tolerance;Decreased balance;Decreased mobility;Decreased knowledge of use of DME;Obesity;Pain       PT Treatment Interventions  DME instruction;Gait training;Stair training;Functional mobility training;Therapeutic activities;Therapeutic exercise;Patient/family education    PT Goals (Current goals can be found in the Care Plan section)  Acute Rehab PT Goals Patient Stated Goal: Walk again PT Goal Formulation: With patient Time For Goal Achievement: 01/04/19 Potential to Achieve Goals: Fair    Frequency 7X/week   Barriers to discharge        Co-evaluation               AM-PAC PT "6 Clicks" Mobility  Outcome Measure Help needed turning from your back to your side while in a flat bed without using bedrails?: A Lot Help needed moving  from lying on your back to sitting on the side of a flat bed without using bedrails?: A Lot Help needed moving to and from a bed to a chair (including a wheelchair)?: A Lot Help needed standing up from a chair using your arms (e.g., wheelchair or bedside chair)?: A Lot Help needed to walk in hospital room?: A Lot Help needed climbing 3-5 steps with a railing? : Total 6 Click Score: 11    End of Session   Activity Tolerance: Patient tolerated treatment well Patient left: in bed;with call bell/phone within reach Nurse Communication: Mobility status PT Visit Diagnosis: History of falling (Z91.81);Difficulty in walking, not elsewhere classified (R26.2);Pain Pain - Right/Left: Right Pain - part of body: Hip    Time: 0910-0926 PT Time Calculation (min) (ACUTE ONLY): 16 min   Charges:   PT Evaluation $PT Eval Low Complexity: 1 Low          [place] Pager [phone] Office [phone]   Duayne Brideau 12/21/2018, 12:39 PM

## 2018-12-21 NOTE — Progress Notes (Signed)
     Subjective: 1 Day Post-Op Procedure(s) (LRB): TOTAL HIP ARTHROPLASTY ANTERIOR APPROACH (Right)   Patient reports pain as mild/moderate.  Pain controlled with medication.  No events throughout the night. Plan for discharge possibly tomorrow due to underlying medical co-morbidities, pain control and need for inpatient therapy to meet goal of being discharged home safely with family/caregiver.  Past Medical History:  Diagnosis Date  . Acute asthmatic bronchitis   . Allergic rhinitis   . Anxiety   . Arthritis   . Esophageal reflux   . Hypertension   . Interstitial lung disease ([place]) dx jan 2020  . Irritable bowel syndrome   . Oxygen dependent    4 liters day time 6 liters at night  . PONV (postoperative nausea and vomiting)    ponv likes zofran, and scopolamine patch  . Rheumatoid arthritis(714.0)   . Sleep apnea      Anticipated LOS equal to or greater than 2 midnights due to - Age 75 and older with one or more of the following:  - Obesity  - Expected need for hospital services (PT, OT, Nursing) required for safe  discharge  - Anticipated need for postoperative skilled nursing care or inpatient rehab  - Active co-morbidities: Heart Failure and Respiratory Failure/COPD    Objective:   VITALS:   Vitals:   12/21/18 0130 12/21/18 0619  BP: (!) 129/57 (!) 138/59  Pulse: 91 80  Resp: 20 18  Temp: 98.6 F (37 C) 97.8 F (36.6 C)  SpO2: 99% 98%    Dorsiflexion/Plantar flexion intact Incision: dressing C/D/I No cellulitis present Compartment soft  LABS Recent Labs    12/21/18 0431  HGB 9.4*  HCT 31.7*  WBC 10.7*  PLT 187    Recent Labs    12/21/18 0431  NA 141  K 3.8  BUN 17  CREATININE 0.93  GLUCOSE 143*     Assessment/Plan: 1 Day Post-Op Procedure(s) (LRB): TOTAL HIP ARTHROPLASTY ANTERIOR APPROACH (Right) Foley cath d/c'ed Advance diet Up with therapy D/C IV fluids Discharge home with home health eventually when ready  Obese (BMI  30-39.9) Estimated body mass index is 39.3 kg/m as calculated from the following:   Height as of this encounter: _0  (1.549 m).   Weight as of this encounter: 94.3 kg. Patient also counseled that weight may inhibit the healing process Patient counseled that losing weight will help with future health issues      [place]. Neah Sporrer   PAC  12/21/2018, 8:16 AM

## 2018-12-21 NOTE — Progress Notes (Signed)
Physical Therapy Treatment Patient Details Name: [name] MRN: [id number] DOB: 1945/09/18 Today's Date: 12/21/2018    History of Present Illness Pt s/p R THR following hip fx.  Pt with hx of RA, Bil TKR, back surgery, multiple L foot surgeries, and L ankle fusion    PT Comments    Pt excited to be up and on her feet but after ambulating ~4' pt attempted to buckle with c/o "it snapped".  Pt returned to chair and RN alerted.   Follow Up Recommendations  Home health PT;Follow surgeon's recommendation for DC plan and follow-up therapies     Equipment Recommendations  None recommended by PT    Recommendations for Other Services OT consult     Precautions / Restrictions Precautions Precautions: Fall Restrictions Weight Bearing Restrictions: No Other Position/Activity Restrictions: WBAT    Mobility  Bed Mobility Overal bed mobility: Needs Assistance Bed Mobility: Supine to Sit     Supine to sit: Min assist;Mod assist;+2 for physical assistance;+2 for safety/equipment     General bed mobility comments: increased time with cues for sequence and use of L LE to self assist.  Physical assist to manage R LE and to bring trunk to upright  Transfers Overall transfer level: Needs assistance Equipment used: Rolling walker (2 wheeled) Transfers: Sit to/from Stand Sit to Stand: Min assist;Mod assist;+2 physical assistance;+2 safety/equipment         General transfer comment: cues for LE management and use of UEs to self assist  Ambulation/Gait Ambulation/Gait assistance: Mod assist;+2 physical assistance;+2 safety/equipment Gait Distance (Feet): 4 Feet Assistive device: Rolling walker (2 wheeled) Gait Pattern/deviations: Step-to pattern;Decreased step length - right;Decreased step length - left;Shuffle;Trunk flexed Gait velocity: decr   General Gait Details: Cues for sequence, posture and position from RW.  Distance ltd with pt attempt to buckle and c/o "I felt it snap".   Chair pulled up and pt returned to sitting.   Stairs             Wheelchair Mobility    Modified Rankin (Stroke Patients Only)       Balance Overall balance assessment: Needs assistance Sitting-balance support: Feet supported;No upper extremity supported Sitting balance-Leahy Scale: Good     Standing balance support: Bilateral upper extremity supported Standing balance-Leahy Scale: Poor Standing balance comment: Pt reliant on UE support                             Cognition Arousal/Alertness: Awake/alert Behavior During Therapy: WFL for tasks assessed/performed Overall Cognitive Status: Within Functional Limits for tasks assessed                                        Exercises Total Joint Exercises Ankle Circles/Pumps: AROM;Both;15 reps;Supine Quad Sets: AROM;Both;10 reps;Supine Heel Slides: AAROM;Right;20 reps;Supine Hip ABduction/ADduction: AAROM;Right;15 reps;Supine    General Comments        Pertinent Vitals/Pain Pain Assessment: 0-10 Pain Score: 5  Pain Location: R hip Pain Descriptors / Indicators: Aching;Sore Pain Intervention(s): Limited activity within patient's tolerance;Monitored during session;Premedicated before session;Ice applied    Home Living Family/patient expects to be discharged to:: Private residence Living Arrangements: Spouse/significant other Available Help at Discharge: Family Type of Home: House Home Access: [place]: One [place]: Environmental consultant - 2 wheels;Bedside commode;Wheelchair - manual Additional Comments: has sleep number bed  Prior Function Level of Independence: Needs assistance  Gait / Transfers Assistance Needed: RW but largely in [place] since hip fx ADL's / Homemaking Assistance Needed: assist of spouse     PT Goals (current goals can now be found in the care plan section) Acute Rehab PT Goals Patient Stated Goal: Walk again PT Goal Formulation: With  patient Time For Goal Achievement: 01/04/19 Potential to Achieve Goals: Fair Progress towards PT goals: Progressing toward goals    Frequency    7X/week      PT Plan Current plan remains appropriate    Co-evaluation              AM-PAC PT "6 Clicks" Mobility   Outcome Measure  Help needed turning from your back to your side while in a flat bed without using bedrails?: A Lot Help needed moving from lying on your back to sitting on the side of a flat bed without using bedrails?: A Lot Help needed moving to and from a bed to a chair (including a wheelchair)?: A Lot Help needed standing up from a chair using your arms (e.g., wheelchair or bedside chair)?: A Lot Help needed to walk in hospital room?: A Lot Help needed climbing 3-5 steps with a railing? : Total 6 Click Score: 11    End of Session Equipment Utilized During Treatment: Gait belt;Oxygen Activity Tolerance: Patient limited by fatigue;Other (comment) Patient left: in chair;with call bell/phone within reach;with chair alarm set Nurse Communication: Mobility status PT Visit Diagnosis: History of falling (Z91.81);Difficulty in walking, not elsewhere classified (R26.2);Pain Pain - Right/Left: Right Pain - part of body: Hip     Time: 1136-1202 PT Time Calculation (min) (ACUTE ONLY): 26 min  Charges:  $Gait Training: 23-37 mins                     [place] Pager [phone] Office [phone]    Adal Sereno 12/21/2018, 12:49 PM

## 2018-12-22 DIAGNOSIS — M1611 Unilateral primary osteoarthritis, right hip: Secondary | ICD-10-CM | POA: Diagnosis not present

## 2018-12-22 LAB — BASIC METABOLIC PANEL
Anion gap: 5 (ref 5–15)
BUN: 17 mg/dL (ref 8–23)
CO2: 29 mmol/L (ref 22–32)
Calcium: 8.6 mg/dL — ABNORMAL LOW (ref 8.9–10.3)
Chloride: 104 mmol/L (ref 98–111)
Creatinine, Ser: 0.79 mg/dL (ref 0.44–1.00)
GFR calc Af Amer: >60 mL/min (ref >60)
GFR calc non Af Amer: >60 mL/min (ref >60)
Glucose, Bld: 106 mg/dL — ABNORMAL HIGH (ref 70–99)
Potassium: 3.9 mmol/L (ref 3.5–5.1)
Sodium: 138 mmol/L (ref 135–145)

## 2018-12-22 LAB — CBC
HCT: 29.8 % — ABNORMAL LOW (ref 36.0–46.0)
Hemoglobin: 8.9 g/dL — ABNORMAL LOW (ref 12.0–15.0)
MCH: 29.8 pg (ref 26.0–34.0)
MCHC: 29.9 g/dL — ABNORMAL LOW (ref 30.0–36.0)
MCV: 99.7 fL (ref 80.0–100.0)
Platelets: 172 10*3/uL (ref 150–400)
RBC: 2.99 MIL/uL — ABNORMAL LOW (ref 3.87–5.11)
RDW: 14.5 % (ref 11.5–15.5)
WBC: 9.8 10*3/uL (ref 4.0–10.5)
nRBC: 0 % (ref 0.0–0.2)

## 2018-12-22 NOTE — Progress Notes (Signed)
Physical Therapy Treatment Patient Details Name: [name] MRN: [id number] DOB: Dec 08, 1945 Today's Date: 12/22/2018    History of Present Illness Pt s/p R THR following hip fx.  Pt with hx of RA, Bil TKR, back surgery, multiple L foot surgeries, and L ankle fusion    PT Comments    Pt continues motivated and progressing well with bed mobility and transfers but continues to struggle with ambulation 2* limited WB tolerance on R LE.  Pt was functioning at bed<>chair level for several months prior to surgery and states husband is available to assist.   Follow Up Recommendations  Home health PT;Follow surgeon's recommendation for DC plan and follow-up therapies     Equipment Recommendations  None recommended by PT    Recommendations for Other Services OT consult     Precautions / Restrictions Precautions Precautions: Fall Restrictions Weight Bearing Restrictions: No Other Position/Activity Restrictions: WBAT    Mobility  Bed Mobility Overal bed mobility: Needs Assistance Bed Mobility: Supine to Sit     Supine to sit: Supervision;HOB elevated     General bed mobility comments: Increased time with HOB elevated and use of bed rail but no physical assist   Transfers Overall transfer level: Needs assistance Equipment used: Rolling walker (2 wheeled) Transfers: Sit to/from Stand Sit to Stand: Min guard         General transfer comment: cues for LE management and use of UEs to self assist  Ambulation/Gait Ambulation/Gait assistance: Min assist;Mod assist;+2 safety/equipment Gait Distance (Feet): 13 Feet Assistive device: Rolling walker (2 wheeled) Gait Pattern/deviations: Step-to pattern;Decreased step length - right;Decreased step length - left;Shuffle;Trunk flexed Gait velocity: decr   General Gait Details: Cues for sequence, posture and position from RW.  Pt allowing min WB on R LE. Distance ltd by fatigue   Stairs             Wheelchair Mobility     Modified Rankin (Stroke Patients Only)       Balance Overall balance assessment: Needs assistance Sitting-balance support: Feet supported;No upper extremity supported Sitting balance-Leahy Scale: Good     Standing balance support: Bilateral upper extremity supported Standing balance-Leahy Scale: Poor Standing balance comment: Pt reliant on UE support                             Cognition Arousal/Alertness: Awake/alert Behavior During Therapy: WFL for tasks assessed/performed Overall Cognitive Status: Within Functional Limits for tasks assessed                                        Exercises Total Joint Exercises Ankle Circles/Pumps: AROM;Both;15 reps;Supine Quad Sets: AROM;Both;10 reps;Supine Heel Slides: AAROM;Right;20 reps;Supine Hip ABduction/ADduction: AAROM;Right;15 reps;Supine    General Comments        Pertinent Vitals/Pain Pain Assessment: 0-10 Pain Score: 4  Pain Location: R hip Pain Descriptors / Indicators: Aching;Sore Pain Intervention(s): Limited activity within patient's tolerance;Monitored during session;Premedicated before session;Ice applied    Home Living                      Prior Function            PT Goals (current goals can now be found in the care plan section) Acute Rehab PT Goals Patient Stated Goal: Walk again PT Goal Formulation: With patient Time For Goal Achievement: 01/04/19  Potential to Achieve Goals: Fair Progress towards PT goals: Progressing toward goals    Frequency    7X/week      PT Plan Current plan remains appropriate    Co-evaluation              AM-PAC PT "6 Clicks" Mobility   Outcome Measure  Help needed turning from your back to your side while in a flat bed without using bedrails?: A Lot Help needed moving from lying on your back to sitting on the side of a flat bed without using bedrails?: A Little Help needed moving to and from a bed to a chair  (including a wheelchair)?: A Little Help needed standing up from a chair using your arms (e.g., wheelchair or bedside chair)?: A Little Help needed to walk in hospital room?: A Lot Help needed climbing 3-5 steps with a railing? : Total 6 Click Score: 14    End of Session Equipment Utilized During Treatment: Gait belt;Oxygen Activity Tolerance: Patient limited by fatigue Patient left: in chair;with call bell/phone within reach;with chair alarm set Nurse Communication: Mobility status PT Visit Diagnosis: History of falling (Z91.81);Difficulty in walking, not elsewhere classified (R26.2);Pain Pain - Right/Left: Right Pain - part of body: Hip     Time: 9480-1655 PT Time Calculation (min) (ACUTE ONLY): 29 min  Charges:  $Gait Training: 8-22 mins $Therapeutic Exercise: 8-22 mins                     [place] Pager [phone] Office [phone]    Loistine Eberlin 12/22/2018, 12:39 PM

## 2018-12-22 NOTE — Care Management Obs Status (Signed)
[facility] NOTIFICATION   Patient Details  Name: [name] MRN: [id number] Date of Birth: [birth year]   Medicare Observation Status Notification Given:  Yes    Joaquin Courts, RN 12/22/2018, 10:26 AM

## 2018-12-22 NOTE — TOC Initial Note (Addendum)
Transition of Care [facility]) - Initial/Assessment Note    Patient Details  Name: [name] MRN: [id number] Date of Birth: 01-06-1946  Transition of Care [facility]) CM/SW Contact:    Joaquin Courts, RN Phone Number: 12/22/2018, 1:00 PM  Clinical Narrative: CM spoke with patient at bedside. Patient reports spouse, Mikki Santee, is available to assist her at home as needed.  Patient has a rolling walker at home, in need of 3-in1.  Patient chooses [facility] to provide HHPT, [facility] to deliver 3-in1. Agency reps notified of referral.               Patient has home O2 through [employer], spouse to bring travel O2 tank for d/c.   Expected Discharge Plan: [place] Barriers to Discharge: No Barriers Identified   Patient Goals and CMS Choice Patient states their goals for this hospitalization and ongoing recovery are:: to get home CMS Medicare.gov Compare Post Acute Care list provided to:: Patient Choice offered to / list presented to : Patient  Expected Discharge Plan and Services Expected Discharge Plan: [place]   Discharge Planning Services: CM Consult Post Acute Care Choice: Durable Medical Equipment, Home Health Living arrangements for the past 2 months: Single Family Home Expected Discharge Date: 12/22/18               DME Arranged: 3-N-1 DME Agency: [employer] Date DME Agency Contacted: 12/22/18 Time DME Agency Contacted: 1300 Representative spoke with at DME Agency: [place]: PT [place]: [place] ([place]) Date [place]: 12/22/18 Time [place]: 71 Representative spoke with at [place]: [place] Arrangements/Services Living arrangements for the past 2 months: [place] with:: Spouse Patient language and need for interpreter reviewed:: Yes Do you feel safe going back to the place where you live?: Yes      Need for Family Participation in Patient Care: Yes  (Comment) Care giver support system in place?: Yes (comment)   Criminal Activity/Legal Involvement Pertinent to Current Situation/Hospitalization: No - Comment as needed  Activities of Daily Living Home Assistive Devices/Equipment: Wheelchair ADL Screening (condition at time of admission) Patient's cognitive ability adequate to safely complete daily activities?: Yes Is the patient deaf or have difficulty hearing?: Yes Does the patient have difficulty seeing, even when wearing glasses/contacts?: No Does the patient have difficulty concentrating, remembering, or making decisions?: No Patient able to express need for assistance with ADLs?: Yes Does the patient have difficulty dressing or bathing?: No Independently performs ADLs?: Yes (appropriate for developmental age) Does the patient have difficulty walking or climbing stairs?: Yes Weakness of Legs: Both Weakness of Arms/Hands: None  Permission Sought/Granted Permission sought to share information with : Family Supports Permission granted to share information with : Yes, Verbal Permission Granted  Share Information with NAME: Mikki Santee     Permission granted to share info w Relationship: spouse     Emotional Assessment Appearance:: Appears stated age Attitude/Demeanor/Rapport: Engaged Affect (typically observed): Accepting Orientation: : Oriented to Self, Oriented to Place, Oriented to Situation, Oriented to  Time   Psych Involvement: No (comment)  Admission diagnosis:  Right hip osteoarthritis, troch fracture Patient Active Problem List   Diagnosis Date Noted  . Obese 12/21/2018  . S/P right THA, AA 12/20/2018  . Pulmonary fibrosis ([place]) 10/12/2018  . Chronic heart failure with preserved ejection fraction (HFpEF) ([place]) 10/12/2018  . Acute on chronic respiratory failure with hypoxia ([place]) 08/29/2018  .  Bronchiectasis without complication ([place]) 57/97/2820  . Wound infection after surgery 05/09/2018  . Hyperlipidemia LDL goal <70  01/09/2017  . Ankle arthritis 12/19/2016  . 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 Hallux valgus of left foot 12/19/2016  . Painful orthopaedic hardware ([place]) 12/19/2016  . Pes planus 12/19/2016  . Subluxation of interphalangeal joint of lesser toe of left foot 12/19/2016  . Pulmonary hypertension ([place]) 10/25/2016  . Coronary artery disease involving native coronary artery of native heart without angina pectoris 10/25/2016  . Shortness of breath 09/12/2016  . Leg edema 09/12/2016  . Obstructive sleep apnea 09/12/2016  . ILD (interstitial lung disease) ([place]) 09/19/2015  . Thrush, oral 06/12/2014  . Influenza A with pneumonia 08/02/2013  . CAP (community acquired pneumonia) 07/31/2013  . Hypoxia 07/31/2013  . Open wound of knee, leg (except thigh), and ankle, without mention of complication 60/15/6153  . Bronchitis, chronic obstructive, with exacerbation ([place]) 07/21/2009  . Asthmatic bronchitis, moderate persistent, uncomplicated 79/43/2761  . RHINOSINUSITIS, CHRONIC 12/19/2008  . ANXIETY DEPRESSION 03/25/2008  . ASTHMATIC BRONCHITIS, ACUTE 05/12/2007  . Seasonal and perennial allergic rhinitis 05/12/2007  . REFLUX, ESOPHAGEAL 05/12/2007  . Rheumatoid arthritis ([place]) 05/12/2007   PCP:  Leonard Downing, MD Pharmacy:   [id number] [id number]: [phone]: [phone] [address] [facility] [place]Phone: [phone]: [phone]    Social Determinants of Health (SDOH) Interventions    Readmission Risk Interventions No flowsheet data found.

## 2018-12-22 NOTE — Discharge Summary (Signed)
Orthopedic Discharge Summary        Physician Discharge Summary  [name] MRN: [id number] DOB/AGE: November 13, 1945 73 y.o.  Admit date: 12/20/2018 Discharge date: 12/22/2018   Procedures:  Procedure(s) (LRB): TOTAL HIP ARTHROPLASTY ANTERIOR APPROACH (Right)  Attending Physician:  Dr. Norva Pavlov  Admission Diagnoses:  Right hip arthritis, end stage  Discharge Diagnoses:  same   Past Medical History:  Diagnosis Date  . Acute asthmatic bronchitis   . Allergic rhinitis   . Anxiety   . Arthritis   . Esophageal reflux   . Hypertension   . Interstitial lung disease ([place]) dx jan 2020  . Irritable bowel syndrome   . Oxygen dependent    4 liters day time 6 liters at night  . PONV (postoperative nausea and vomiting)    ponv likes zofran, and scopolamine patch  . Rheumatoid arthritis(714.0)   . Sleep apnea     PCP: Leonard Downing, MD   Discharged Condition: good  Hospital Course:  Patient underwent the above stated procedure on 12/20/2018. Patient tolerated the procedure well and brought to the recovery room in good condition and subsequently to the floor. Patient had an uncomplicated hospital course and was stable for discharge.   Disposition: Discharge disposition: 01-Home or Self Care      with follow up in 2 weeks   Follow-up Information    Paralee Cancel, MD. Schedule an appointment as soon as possible for a visit in 2 weeks.   Specialty:  Orthopedic Surgery Contact information: [address] [place] [address] [phone]           Discharge Instructions    Call MD / Call 911   Complete by:  As directed    If you experience chest pain or shortness of breath, CALL 911 and be transported to the hospital emergency room.  If you develope a fever above 101 F, pus (white drainage) or increased drainage or redness at the wound, or calf pain, call your surgeon's office.   Call MD / Call 911   Complete by:  As directed    If you experience chest pain or shortness of breath, CALL 911 and be transported to the hospital emergency room.  If you develope a fever above 101 F, pus (white drainage) or increased drainage or redness at the wound, or calf pain, call your surgeon's office.   Change dressing   Complete by:  As directed    Maintain surgical dressing until follow up in the clinic. If the edges start to pull up, may reinforce with tape. If the dressing is no longer working, may remove and cover with gauze and tape, but must keep the area dry and clean.  Call with any questions or concerns.   Constipation Prevention   Complete by:  As directed    Drink plenty of fluids.  Prune juice may be helpful.  You may use a stool softener, such as Colace (over the counter) 100 mg twice a day.  Use MiraLax (over the counter) for constipation as needed.   Constipation Prevention   Complete by:  As directed    Drink plenty of fluids.  Prune juice may be helpful.  You may use a stool softener, such as Colace (over the counter) 100 mg twice a day.  Use MiraLax (over the counter) for constipation as needed.   Diet - low sodium heart healthy   Complete by:  As directed    Diet - low  sodium heart healthy   Complete by:  As directed    Discharge instructions   Complete by:  As directed    Maintain surgical dressing until follow up in the clinic. If the edges start to pull up, may reinforce with tape. If the dressing is no longer working, may remove and cover with gauze and tape, but must keep the area dry and clean.  Follow [facility] in 2 [facility] at [facility]. Call with any questions or concerns.   Increase activity slowly as tolerated   Complete by:  As directed    Weight bearing as tolerated with assist device (walker, cane, etc) as directed, use it as long as suggested by your surgeon or therapist, typically at least 4-6 weeks.   Increase activity slowly as tolerated   Complete by:  As directed    TED hose   Complete by:   As directed    Use stockings (TED hose) for 2 weeks on both leg(s).  You may remove them at night for sleeping.      Allergies as of 12/22/2018      Reactions   Cefdinir Diarrhea   Erythromycin Base Diarrhea   Other   Lactose Intolerance (gi) Diarrhea   Lactulose Diarrhea   Mesalamine Nausea Only   Nitrofuran Derivatives    shakiness   Nitrofurantoin Other (See Comments)   shakiness other   Other Diarrhea, Other (See Comments)   Shaking uncontrollably "lettuce only" "lettuce only"   Sulfa Antibiotics Other (See Comments)   Patient can't recall reaction   Tdap [tetanus-diphth-acell Pertussis]    Shaking uncontrollably   Tetanus Toxoid, Adsorbed Other (See Comments)   Shaking uncontrollable       Medication List    STOP taking these medications   aspirin EC 81 MG tablet Replaced by:  aspirin 81 MG chewable tablet   ibuprofen 400 MG tablet Commonly known as:  ADVIL   traMADol 50 MG tablet Commonly known as:  ULTRAM     TAKE these medications   ALPRAZolam 0.5 MG tablet Commonly known as:  XANAX Take 0.5 mg by mouth as needed for anxiety.   Anoro Ellipta 62.5-25 MCG/INH Aepb Generic drug:  umeclidinium-vilanterol USE 1 INHALATION DAILY What changed:  See the new instructions.   aspirin 81 MG chewable tablet Commonly known as:  Aspirin Childrens Chew 1 tablet (81 mg total) by mouth 2 (two) times daily for 30 days. Take for 4 weeks, then resume regular dose. Replaces:  aspirin EC 81 MG tablet   benzonatate 200 MG capsule Commonly known as:  TESSALON TAKE 1 CAPSULE BY MOUTH 3 TIMES A DAY AS NEEDED FOR COUGH What changed:  See the new instructions.   cetirizine 10 MG tablet Commonly known as:  ZYRTEC Take 10 mg by mouth daily.   clidinium-chlordiazePOXIDE 5-2.5 MG capsule Commonly known as:  LIBRAX Take 1 capsule by mouth 2 (two) times daily as needed (for IBS).   colestipol 1 g tablet Commonly known as:  COLESTID Take 1 g by mouth 2 (two) times daily.    [employer] D3 125 MCG (5000 UT) capsule Generic drug:  Cholecalciferol Take 5,000 Units by mouth daily.   docusate sodium 100 MG capsule Commonly known as:  Colace Take 1 capsule (100 mg total) by mouth 2 (two) times daily.   Esbriet 267 MG Tabs Generic drug:  Pirfenidone Take 534-801 mg by mouth 3 (three) times daily. 534 mg in the am 801 at lunch 801 at night   ferrous  sulfate 325 (65 FE) MG tablet Commonly known as:  FerrouSul Take 1 tablet (325 mg total) by mouth 3 (three) times daily with meals.   FLUoxetine 20 MG capsule Commonly known as:  PROZAC Take 20 mg by mouth daily.   fluticasone 50 MCG/ACT nasal spray Commonly known as:  FLONASE Place 2 sprays into the nose daily.   furosemide 40 MG tablet Commonly known as:  LASIX Take 1 tablet (40 mg total) by mouth 2 (two) times daily.   glycopyrrolate 1 MG tablet Commonly known as:  ROBINUL Take 1 mg by mouth 3 (three) times daily as needed (for IBS).   HYDROcodone-acetaminophen 7.5-325 MG tablet Commonly known as:  Norco Take 1-2 tablets by mouth every 4 (four) hours as needed for moderate pain.   hydroxychloroquine 200 MG tablet Commonly known as:  PLAQUENIL Take 2 tablets (400 mg total) by mouth daily. Stop for 2 weeks, post-op. What changed:  additional instructions   hydroxypropyl methylcellulose / hypromellose 2.5 % ophthalmic solution Commonly known as:  ISOPTO TEARS / GONIOVISC Place 1-2 drops into both eyes 3 (three) times daily as needed for dry eyes.   ipratropium 0.03 % nasal spray Commonly known as:  ATROVENT USE 1 TO 2 SPRAYS INTO BOTH NOSTRILS 2 TIMES DAILY. What changed:  See the new instructions.   ipratropium-albuterol 0.5-2.5 (3) MG/3ML Soln Commonly known as:  DUONEB Use 1 vial in nebulizer every 6 hours as needed What changed:    how much to take  how to take this  when to take this   leflunomide 20 MG tablet Commonly known as:  ARAVA Take 20 mg by mouth daily.   methocarbamol 500 MG  tablet Commonly known as:  Robaxin Take 1 tablet (500 mg total) by mouth every 6 (six) hours as needed for muscle spasms. What changed:    medication strength  how much to take  when to take this   mirtazapine 45 MG tablet Commonly known as:  REMERON Take 45 mg by mouth at bedtime.   montelukast 10 MG tablet Commonly known as:  SINGULAIR TAKE 1 TABLET BY MOUTH AT  BEDTIME   multivitamin tablet Take 1 tablet by mouth daily.   omeprazole 40 MG capsule Commonly known as:  PRILOSEC Take 40 mg by mouth at bedtime.   OXYGEN Inhale 4-6 mLs into the lungs continuous. 4 during the day, 6 at night   polyethylene glycol 17 g packet Commonly known as:  MIRALAX / GLYCOLAX Take 17 g by mouth 2 (two) times daily.   potassium chloride SA 20 MEQ tablet Commonly known as:  K-DUR Take 1 tablet (20 mEq total) by mouth 2 (two) times daily.   ProAir HFA 108 (90 Base) MCG/ACT inhaler Generic drug:  albuterol USE 2 PUFFS BY MOUTH EVERY  6 HOURS AS NEEDED What changed:  See the new instructions.   rosuvastatin 20 MG tablet Commonly known as:  CRESTOR Take 1 tablet (20 mg total) by mouth daily.   verapamil 240 MG CR tablet Commonly known as:  CALAN-SR Take 1 tablet (240 mg total) by mouth daily.            Durable Medical Equipment  (From admission, onward)         Start     Ordered   12/22/18 1122  For home use only DME 3 n 1  Once    Comments:  wide   12/22/18 1122           Discharge  Care Instructions  (From admission, onward)         Start     Ordered   12/21/18 0000  Change dressing    Comments:  Maintain surgical dressing until follow up in the clinic. If the edges start to pull up, may reinforce with tape. If the dressing is no longer working, may remove and cover with gauze and tape, but must keep the area dry and clean.  Call with any questions or concerns.   12/21/18 8338            Signed: Augustin Schooling 12/22/2018, 11:25 AM  [place] [facility] now [facility] [address], [place], [place], [place] [address] Phone: [place]

## 2018-12-22 NOTE — Plan of Care (Signed)
Patient lying in bed this morning; states she had a good night and rested well. Requesting pain medication for right hip pain and in anticipation of working with PT. Will continue to monitor.

## 2018-12-22 NOTE — Plan of Care (Signed)
Reviewed discharge instructions; copy given. IV removed. Patient ready for discharge.

## 2018-12-22 NOTE — Progress Notes (Signed)
Orthopedics Progress Note  Subjective: Patient did well with therapy today and feels that she is ready to be discharged home  Objective:  Vitals:   12/22/18 0837 12/22/18 0839  BP:    Pulse:    Resp:    Temp:    SpO2: 97% 97%    General: Awake and alert  Musculoskeletal:  Right hip dressing CDI. No distal swelling. No cords and neg Homans Neurovascularly intact  Lab Results  Component Value Date   WBC 9.8 12/22/2018   HGB 8.9 (L) 12/22/2018   HCT 29.8 (L) 12/22/2018   MCV 99.7 12/22/2018   PLT 172 12/22/2018       Component Value Date/Time   NA 138 12/22/2018 0326   NA 142 01/09/2017 1024   K 3.9 12/22/2018 0326   CL 104 12/22/2018 0326   CO2 29 12/22/2018 0326   GLUCOSE 106 (H) 12/22/2018 0326   BUN 17 12/22/2018 0326   BUN 18 01/09/2017 1024   CREATININE 0.79 12/22/2018 0326   CREATININE 0.91 05/30/2016 1002   CALCIUM 8.6 (L) 12/22/2018 0326   GFRNONAA >60 12/22/2018 0326   GFRAA >60 12/22/2018 0326    Lab Results  Component Value Date   INR 1.0 12/17/2018   INR 1.0 09/12/2016   INR 0.90 09/03/2010    Assessment/Plan: POD #2 s/p Procedure(s): TOTAL HIP ARTHROPLASTY ANTERIOR APPROACH Patient doing very well and cleared for discharge home by therapy  Remo Lipps R. Veverly Fells, MD 12/22/2018 11:22 AM

## 2018-12-22 NOTE — Progress Notes (Signed)
  Home health agencies that serve [id number].        Home Health Agencies Search Results  Results List Table  Home Health Agency Information Quality of Patient Care Rating Patient Survey Summary Rating  [facility] [phone] 4 out of 5 stars 4 out of 5 stars  [facility] [phone] 3 out of 5 stars 5 out of 5 stars  [facility] [phone] 3 out of 5 stars 4 out of 5 stars  [facility] [phone] 4  out of 5 stars 3 out of 5 stars  [facility] [phone] 4 out of 5 stars 4 out of 5 stars  [facility] [phone] 4  out of 5 stars 4 out of 5 stars  [facility] [phone] 4 out of 5 stars 4 out of 5 stars  [facility] [phone] 4 out of 5 stars 4 out of 5 stars  [facility] [phone] 3  out of 5 stars 4 out of 5 stars  [facility] [phone] 3 out of 5 stars 4 out of 5 stars  [facility] [phone] 4 out of 5 stars Not Available12  [facility] [phone] 3  out of 5 stars 3 out of 5 stars  [facility] [phone] 3  out of 5 stars 4 out of 5 stars  [facility] [phone] 3  out of 5 stars 4 out of 5 stars  [facility] [phone] 3  out of 5 stars 3 out of 5 stars  [facility] [phone] 3  out of 5 stars Not Available11  [facility] [phone] 3  out of 5 stars 4 out of 5 stars  [facility] [phone] 4  out of 5 stars 3 out of 5 stars  [facility] [phone] 4  out of 5 stars 2 out of 5 stars   Home Health Footnotes  Footnote number Footnote as displayed on Home Health Compare  1 This agency provides services under a federal waiver program to non-traditional, chronic long term population.  2 This agency provides services to a  special needs population.  3 Not Available.  4 The number of patient episodes for this measure is too small to report.  5 This measure currently does not have data or provider has been certified/recertified for less than 6 months.  6 The national average for this measure is not provided because of state-to-state differences in data collection.  7 Medicare is not displaying rates for this measure for any home health agency, because of an issue with the data.  8 There were problems with the data and they are being corrected.  9 Zero, or very few, patients met the survey's rules for inclusion. The scores shown, if any, reflect a very small number of surveys and may not accurately tell how an agency is doing.  10 Survey results are based on less than 12 months of data.  11 Fewer than 70 patients completed the survey. Use the scores shown, if any, with caution as the number of surveys may be too low to accurately tell how an agency is doing.  12 No survey results are available for this period.  13 Data suppressed by CMS   for one or more quarters.

## 2018-12-25 ENCOUNTER — Ambulatory Visit (HOSPITAL_COMMUNITY): Payer: Medicare Other

## 2018-12-27 ENCOUNTER — Ambulatory Visit (HOSPITAL_COMMUNITY): Payer: Medicare Other

## 2019-01-01 ENCOUNTER — Ambulatory Visit (HOSPITAL_COMMUNITY): Payer: Medicare Other

## 2019-01-07 NOTE — Progress Notes (Signed)
Discharge Progress Report  Patient Details  Name: [name] MRN: [id number] Date of Birth: 10-31-1945 Referring Provider:     Pulmonary Rehab Walk Test from 09/13/2018 in [facility]  Referring Provider  Dr. Annamaria Boots       Number of Visits: 0  Reason for Discharge:  Early Exit:  due to department closure during covid pandemic precautions  Smoking History:  Social History   Tobacco Use  Smoking Status Never Smoker  Smokeless Tobacco Never Used  Tobacco Comment   positive passive tobacco smoke exposure    Diagnosis:  Interstitial lung disease ([place])  ADL UCSD: Pulmonary Assessment Scores    Row Name 09/10/18 1200 09/13/18 1536       ADL UCSD   ADL Phase  Entry  -    SOB Score total  78  -      CAT Score   CAT Score  pre 26  -      mMRC Score   mMRC Score  -  3       Initial Exercise Prescription: Initial Exercise Prescription - 09/13/18 1500      Date of Initial Exercise RX and Referring Provider   Date  09/13/18    Referring Provider  Dr. Annamaria Boots      Oxygen   Oxygen  Continuous    Liters  2      NuStep   Level  2    SPM  80    Minutes  17      Arm Ergometer   Level  1    Minutes  17      Track   Laps  6    Minutes  17      Prescription Details   Frequency (times per week)  2    Duration  Progress to 45 minutes of aerobic exercise without signs/symptoms of physical distress      Intensity   THRR 40-80% of Max Heartrate  58-117    Ratings of Perceived Exertion  11-13    Perceived Dyspnea  0-4      Progression   Progression  Continue to progress workloads to maintain intensity without signs/symptoms of physical distress.      Resistance Training   Training Prescription  Yes    Weight  orange bands    Reps  10-15       Discharge Exercise Prescription (Final Exercise Prescription Changes):   Functional Capacity: 6 Minute Walk    Row Name 09/13/18 1537         6 Minute Walk   Phase  Initial      Distance  663 feet     Walk Time  5.25 minutes     # of Rest Breaks  1     MPH  1.26     METS  2     RPE  12     Perceived Dyspnea   2     VO2 Peak  4.28     Symptoms  Yes (comment)     Comments  used wheelchair. One seated break for 45 seconds. Left foot pain 2/10     Resting HR  81 bpm     Resting BP  110/72     Resting Oxygen Saturation   95 %     Exercise Oxygen Saturation  during 6 min walk  89 %     Max Ex. HR  115 bpm     Max Ex.  BP  142/72     2 Minute Post BP  116/68       Interval HR   1 Minute HR  100     2 Minute HR  110     3 Minute HR  115     4 Minute HR  109     5 Minute HR  108     6 Minute HR  113     2 Minute Post HR  88     Interval Heart Rate?  Yes       Interval Oxygen   Interval Oxygen?  Yes     Baseline Oxygen Saturation %  95 %     1 Minute Oxygen Saturation %  91 %     1 Minute Liters of Oxygen  2 L     2 Minute Oxygen Saturation %  89 %     2 Minute Liters of Oxygen  2 L     3 Minute Oxygen Saturation %  89 %     3 Minute Liters of Oxygen  2 L     4 Minute Oxygen Saturation %  90 %     4 Minute Liters of Oxygen  2 L     5 Minute Oxygen Saturation %  92 %     5 Minute Liters of Oxygen  2 L     6 Minute Oxygen Saturation %  89 %     6 Minute Liters of Oxygen  2 L     2 Minute Post Oxygen Saturation %  95 %     2 Minute Post Liters of Oxygen  2 L        Psychological, QOL, Others - Outcomes: PHQ 2/9: Depression screen [facility] 2/9 09/10/2018 05/09/2018  Decreased Interest 0 0  Down, Depressed, Hopeless 1 0  PHQ - 2 Score 1 0  Altered sleeping 3 -  Tired, decreased energy 3 -  Change in appetite 0 -  Feeling bad or failure about yourself  1 -  Trouble concentrating 0 -  Moving slowly or fidgety/restless 0 -  Suicidal thoughts 0 -  Difficult doing work/chores Somewhat difficult -  Some recent data might be hidden    Quality of Life:   Personal Goals: Goals established at orientation with interventions provided to work toward  goal. Personal Goals and Risk Factors at Admission - 09/10/18 1216      Core Components/Risk Factors/Patient Goals on Admission    Weight Management  Obesity;Weight Loss    Improve shortness of breath with ADL's  Yes    Intervention  Provide education, individualized exercise plan and daily activity instruction to help decrease symptoms of SOB with activities of daily living.    Expected Outcomes  Short Term: Improve cardiorespiratory fitness to achieve a reduction of symptoms when performing ADLs;Long Term: Be able to perform more ADLs without symptoms or delay the onset of symptoms    Stress  Yes    Intervention  Offer individual and/or small group education and counseling on adjustment to heart disease, stress management and health-related lifestyle change. Teach and support self-help strategies.;Refer participants experiencing significant psychosocial distress to appropriate mental health specialists for further evaluation and treatment. When possible, include family members and significant others in education/counseling sessions.    Expected Outcomes  Short Term: Participant demonstrates changes in health-related behavior, relaxation and other stress management skills, ability to obtain effective social support, and compliance with psychotropic medications if prescribed.;Long Term:  Emotional wellbeing is indicated by absence of clinically significant psychosocial distress or social isolation.        Personal Goals Discharge: Goals and Risk Factor Review    Row Name 09/10/18 1217 09/20/18 0840 10/08/18 1414         Core Components/Risk Factors/Patient Goals Review   Personal Goals Review  Weight Management/Obesity;Develop more efficient breathing techniques such as purse lipped breathing and diaphragmatic breathing and practicing self-pacing with activity.;Stress;Increase knowledge of respiratory medications and ability to use respiratory devices properly.;Improve shortness of breath with  ADL's  Weight Management/Obesity;Develop more efficient breathing techniques such as purse lipped breathing and diaphragmatic breathing and practicing self-pacing with activity.;Stress;Increase knowledge of respiratory medications and ability to use respiratory devices properly.;Improve shortness of breath with ADL's  Weight Management/Obesity;Develop more efficient breathing techniques such as purse lipped breathing and diaphragmatic breathing and practicing self-pacing with activity.;Stress;Increase knowledge of respiratory medications and ability to use respiratory devices properly.;Improve shortness of breath with ADL's     Review  -  Pt called out of initial start date for exercise of 3/5. Will hope to start exercise on 3/10  Pt has not attended an exercise session. Rehab is currently closed and will plan to re-evaulate pt upon departmental reopening.     Expected Outcomes  -  see admission goals and outcomes  see admission goals and outcomes        Exercise Goals and Review:   Exercise Goals Re-Evaluation: Exercise Goals Re-Evaluation    Row Name 09/18/18 0837 10/08/18 1023           Exercise Goal Re-Evaluation   Exercise Goals Review  Increase Physical Activity;Increase Strength and Stamina;Able to understand and use rate of perceived exertion (RPE) scale;Able to understand and use Dyspnea scale;Knowledge and understanding of Target Heart Rate Range (THRR);Understanding of Exercise Prescription  Increase Physical Activity;Increase Strength and Stamina;Able to understand and use rate of perceived exertion (RPE) scale;Able to understand and use Dyspnea scale;Knowledge and understanding of Target Heart Rate Range (THRR);Understanding of Exercise Prescription      Comments  Pt will begin exercise on Thursday. Pt was extremely anxious during her walk test. Will monitor and progress as appropriate through the program when she begins.   Pt will begin exercise when our department opens back up.       Expected Outcomes  Through exercise at rehab and at home, the patient will decrease shortness of breath with daily activities and feel confident in carrying out an exercise regime at home.   Through exercise at rehab and at home, the patient will decrease shortness of breath with daily activities and feel confident in carrying out an exercise regime at home.          Nutrition & Weight - Outcomes: Pre Biometrics - 09/10/18 1154      Pre Biometrics   Grip Strength  20 kg        Nutrition: Nutrition Therapy & Goals - 10/03/18 0956      Nutrition Therapy   Diet  general healthful      Personal Nutrition Goals   Nutrition Goal  Pt to identify and limit food sources of sodium, saturated fat, trans fat, and refined carbohydrates    Personal Goal #2  Identify food quantities necessary to achieve wt loss of 1/2- 2# per week to a goal wt loss of 2.7-10.9 kg (6-24 lb) at graduation from pulmonary rehab    Personal Goal #3  pt able to name foods that affect blood  glucose      Intervention Plan   Intervention  Prescribe, educate and counsel regarding individualized specific dietary modifications aiming towards targeted core components such as weight, hypertension, lipid management, diabetes, heart failure and other comorbidities.    Expected Outcomes  Short Term Goal: Understand basic principles of dietary content, such as calories, fat, sodium, cholesterol and nutrients.;Long Term Goal: Adherence to prescribed nutrition plan.       Nutrition Discharge: Nutrition Assessments - 12/20/18 1438      Rate Your Plate Scores   Pre Score  48    Post Score  --   unable to assess at pt did not complete post survey      Education Questionnaire Score: Knowledge Questionnaire Score - 09/10/18 1208      Knowledge Questionnaire Score   Pre Score  12/18       Goals reviewed with patient; copy given to patient.

## 2019-01-07 NOTE — Addendum Note (Signed)
Encounter addended by: Lance Morin, RN on: 01/07/2019 9:37 AM  Actions taken: Clinical Note Signed, Episode resolved

## 2019-01-14 ENCOUNTER — Other Ambulatory Visit: Payer: Self-pay | Admitting: Internal Medicine

## 2019-02-11 ENCOUNTER — Encounter: Payer: Self-pay | Admitting: Pulmonary Disease

## 2019-02-11 ENCOUNTER — Ambulatory Visit (INDEPENDENT_AMBULATORY_CARE_PROVIDER_SITE_OTHER): Payer: Medicare Other | Admitting: Pulmonary Disease

## 2019-02-11 ENCOUNTER — Telehealth: Payer: Self-pay | Admitting: Pulmonary Disease

## 2019-02-11 ENCOUNTER — Other Ambulatory Visit: Payer: Self-pay

## 2019-02-11 VITALS — BP 128/72 | HR 86 | Temp 98.2°F | Ht 61.0 in | Wt 202.0 lb

## 2019-02-11 DIAGNOSIS — Z5181 Encounter for therapeutic drug level monitoring: Secondary | ICD-10-CM

## 2019-02-11 DIAGNOSIS — R5381 Other malaise: Secondary | ICD-10-CM

## 2019-02-11 DIAGNOSIS — G4733 Obstructive sleep apnea (adult) (pediatric): Secondary | ICD-10-CM | POA: Diagnosis not present

## 2019-02-11 DIAGNOSIS — J849 Interstitial pulmonary disease, unspecified: Secondary | ICD-10-CM | POA: Diagnosis not present

## 2019-02-11 DIAGNOSIS — M069 Rheumatoid arthritis, unspecified: Secondary | ICD-10-CM

## 2019-02-11 DIAGNOSIS — J479 Bronchiectasis, uncomplicated: Secondary | ICD-10-CM

## 2019-02-11 DIAGNOSIS — I272 Pulmonary hypertension, unspecified: Secondary | ICD-10-CM

## 2019-02-11 DIAGNOSIS — T8149XA Infection following a procedure, other surgical site, initial encounter: Secondary | ICD-10-CM

## 2019-02-11 MED ORDER — ESBRIET 801 MG PO TABS: 801.0000 mg | ORAL_TABLET | Freq: Every day | ORAL | 1 refills | Status: DC

## 2019-02-11 MED ORDER — TRELEGY ELLIPTA 100-62.5-25 MCG/INH IN AEPB: 1.0000 | INHALATION_SPRAY | Freq: Every day | RESPIRATORY_TRACT | 0 refills | Status: DC

## 2019-02-11 NOTE — Patient Instructions (Addendum)
Continue Esbriet   Trelegy Ellipta  >>> 1 puff daily in the morning >>>rinse mouth out after use  >>> This inhaler contains 3 medications that help manage her respiratory status, contact our office if you cannot afford this medication or unable to remain on this medication   Referral to cardiology/ HF clinic- Dr. Aundra Dubin or Dr. Tempie Hoist perDr. [name] >>>we are checking on the status of this referral   Keep follow up with Dr. Trudie Reed   Overnight Oximetry ordered  >>>Completed on 4L and with CPAP     We recommend that you continue using your CPAP daily >>>Keep up the hard work using your device >>> Goal should be wearing this for the entire night that you are sleeping, at least 4 to 6 hours  Remember:   Do not drive or operate heavy machinery if tired or drowsy.   Please notify the supply company and office if you are unable to use your device regularly due to missing supplies or machine being broken.   Work on maintaining a healthy weight and following your recommended nutrition plan   Maintain proper daily exercise and movement   Maintaining proper use of your device can also help improve management of other chronic illnesses such as: Blood pressure, blood sugars, and weight management.   BiPAP/ CPAP Cleaning:  >>>Clean weekly, with Dawn soap, and bottle brush. Set up to air dry.   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 Use a flutter valve 10 breaths twice a day or 4 to 5 breaths 4-5 times a day to help clear mucus out Let us know if you have cough with change in mucus color or fevers or chills. At that point you would need an antibiotic. Maintain a healthy nutritious diet, eating whole foods Take your medications as prescribed   Return in about 6 weeks (around 03/25/2019), or if symptoms worsen or fail to improve, for Follow up with Dr.  Purnell Shoemaker.    Coronavirus (COVID-19) Are you at risk?  Are you at risk for the Coronavirus (COVID-19)?  To be considered HIGH RISK for Coronavirus (COVID-19), you have to meet the following criteria:  . Traveled to [place], [place], [place], [place] or [place]; or in the [place] to [place], [place], [place], or [place]; and have fever, cough, and shortness of breath within the last 2 weeks of travel OR . Been in close contact with a person diagnosed with COVID-19 within the last 2 weeks and have fever, cough, and shortness of breath . IF YOU DO NOT MEET THESE CRITERIA, YOU ARE CONSIDERED LOW RISK FOR COVID-19.  What to do if you are HIGH RISK for COVID-19?  Marland Kitchen If you are having a medical emergency, call 911. . Seek medical care right away. Before you go to a doctor's office, urgent care or emergency department, call ahead and tell them about your recent travel, contact with someone diagnosed with COVID-19, and your symptoms. You should receive instructions from your physician's office regarding next steps of care.  . When you arrive at healthcare provider, tell the healthcare staff immediately you have returned from visiting [place], [place], [place], [place] or [place]; or traveled in the [place] to [place], [place], [place], or [place]; in the last two weeks or you have been in close contact with a person diagnosed with COVID-19 in the last 2 weeks.   . Tell the  health care staff about your symptoms: fever, cough and shortness of breath. . After you have been seen by a medical provider, you will be either: o Tested for (COVID-19) and discharged home on quarantine except to seek medical care if symptoms worsen, and asked to  - Stay home and avoid contact with others until you get your results (4-5 days)  - Avoid travel on public transportation if possible (such as bus, train, or airplane) or o Sent to the Emergency Department by EMS for evaluation, COVID-19 testing,  and possible admission depending on your condition and test results.  What to do if you are LOW RISK for COVID-19?  Reduce your risk of any infection by using the same precautions used for avoiding the common cold or flu:  Marland Kitchen Wash your hands often with soap and warm water for at least 20 seconds.  If soap and water are not readily available, use an alcohol-based hand sanitizer with at least 60% alcohol.  . If coughing or sneezing, cover your mouth and nose by coughing or sneezing into the elbow areas of your shirt or coat, into a tissue or into your sleeve (not your hands). . Avoid shaking hands with others and consider head nods or verbal greetings only. . Avoid touching your eyes, nose, or mouth with unwashed hands.  . Avoid close contact with people who are sick. . Avoid places or events with large numbers of people in one location, like concerts or sporting events. . Carefully consider travel plans you have or are making. . If you are planning any travel outside or inside the [place], visit the CDC's Travelers' Health webpage for the latest health notices. . If you have some symptoms but not all symptoms, continue to monitor at home and seek medical attention if your symptoms worsen. . If you are having a medical emergency, call 911.   [place] / e-Visit: [email or web address]         [facility]: [facility]: [phone]                   [facility]: [phone]           It is flu season:   >>> Best ways to protect herself from the flu: Receive the yearly flu vaccine, practice good hand hygiene washing with soap and also using hand sanitizer when available, eat a nutritious meals, get adequate rest, hydrate appropriately   Please contact the office if your symptoms worsen or you have concerns that you are not improving.    Thank you for choosing [facility] for your healthcare, and for allowing [place] to partner with you on your healthcare journey. I am thankful to be able to provide care to you today.   Wyn Quaker FNP-C

## 2019-02-11 NOTE — Assessment & Plan Note (Signed)
Plan Continue therapy vest Follow-up with our office in 6 weeks

## 2019-02-11 NOTE — Telephone Encounter (Signed)
02/11/2019 1619  Triage,  Can we contact the patient and let her know that we need her to complete lab work.  This was completely my mistake and should have been completed today for patient's convenience.  I have placed the order for a CMET this is simply to monitor liver functioning as she is on Esbriet.  Patient needs to complete lab work within the next 1 to 2 weeks ideally.  Wyn Quaker, FNP

## 2019-02-11 NOTE — Telephone Encounter (Signed)
Thank you so much.  Appreciate you coordinating with the patient.  Wyn Quaker, FNP

## 2019-02-11 NOTE — Assessment & Plan Note (Signed)
Recently completed physical therapy Has pending referral to pulmonary rehab on hold due to COVID-19 pandemic  Plan: Continue to increase physical activity Continue to work on reducing weight and BMI

## 2019-02-11 NOTE — Telephone Encounter (Signed)
Spoke with pt, I explained that she needed lab work to monitor her liver and that it was our mistake that she didn't get it done today. Pt was understanding and stated she would be in to do lab work tomorrow or the next day. Martie Lee

## 2019-02-11 NOTE — Assessment & Plan Note (Signed)
Plan: Continue follow-up with rheumatology Continue Plaquenil and Arava as outlined by rheumatology

## 2019-02-11 NOTE — Assessment & Plan Note (Signed)
Plan: Continue follow-up with orthopedics regarding surgical site infection Continue Keflex under management of orthopedics Likely need to consider wound culture Should have tentative follow-up with wound care if wound site non-closing

## 2019-02-11 NOTE — Progress Notes (Signed)
_0  ID: [name], female    DOB: October 20, 1945, 73 y.o.   MRN: [id number]  Chief Complaint  Patient presents with  . Follow-up    Currently taking Keflex for right hip surgery June 4th. Keflex is long term due to area not healing. Here today to discuss Esbriet.     Referring provider: Leonard Downing, *  HPI:  73 year old female never smoker followed in our office for allergic rhinitis, rhinosinusitis, asthmatic bronchitis, ILD  PMH: GERD, rheumatoid arthritis, ASCVD, IBS Smoker/ Smoking History: Never smoker Maintenance: Plaquenil, Trelegy Ellipta, Arava Pt of: [name] for ILD, Dr. Annamaria Boots for OSA  02/11/2019  - Visit   73 year old female never smoker followed in our office for asthmatic bronchitis, allergic rhinitis, RA ILD, and OSA on CPAP (Dr. Annamaria Boots).  Patient is maintained on Plaquenil, Trelegy Ellipta as well as Arava.  Patient was previously managed on Anoro Ellipta and reports that the Trelegy Ellipta is working better for her.  She prefers the Trelegy Ellipta.   Patient is now maintained on Esbriet.  She reports that things are going well for her on Esbriet.  She denies nausea.  She would like to see if she can change from the 267 mg tablets (3 total) 3 times daily to the 801 mg tablets 3 times daily.  She would prefer this.  Ultimately patient feels that she is doing well on Esbriet.  She would like to remain on this.  Overall, pulmonary wise patient reports the best she is felt in months.  She has been increasing her daily physical activity has been working on losing weight.  She feels that the Esbriet is really helping with her symptoms.  She also attributes this to the Trelegy Ellipta which she prefers over the [employer].  They report is around $400 out of pocket for a 90-day supply of Trelegy Ellipta.  They have been unable to qualify for patient assistance programs based off of income.  Patient also recently completed hip surgery but with Dr. Alvan Dame in  June/2020.  They report today that surgical site is almost completely healed but is having difficulty closing.  She is maintained on Keflex for this.  Patient is working closely with orthopedics to follow this and likely will need referral to wound care clinic.  They have contacted orthopedics today to follow-up on this.  Patient has a history of slow to resolve surgical sites and wounds.  She has been referred to the wound clinic before.  Patient is managed on CPAP therapy for obstructive sleep apnea.  CPAP compliance report shows excellent compliance see compliance report listed below:  01/09/2019-02/07/2019-CPAP compliance report-30 had a last 30 days use, all 30 those days greater than 4 hours, average usage 9 hours and 10 minutes, APAP setting 5-15, AHI 0.6     Tests:   PFT 09/03/2015-mild obstructive airways disease, minimal restriction, mild diffusion defect. FVC 2.17/83%, FEV1 1.80/92%, ratio 0.83. No response to bronchodilator. TLC 78%, DLCO 73%.  10/01/2018- spirometry with DLCO-FVC 1.81 (72% predicted), ratio 75, FEV1 1.36 (73% predicted), DLCO 69   Office Spirometry 02/21/17-moderate restriction consistent with body habitus. FVC 1.70/66%, FEV1 1.49/77%, ratio 0.87, FEF 25-75% 2.39/146%.  Unattended Home Sleep Test 08/12/2016-AHI 18.2/hour, desaturation to 75% with average 88%, body weight 180 pounds CPAP titration sleep study 11/09/16.-6 cwp-  Needed supplemental oxygen because of sustained low saturation with 17.5 minutes recording saturation less than 88% on CPAP  Imaging:   12/30/2016-chest x-ray- no active cardiopulmonary disease 03/14/2016-CT chest  high-res- mild basilar predominant subpleural reticulation and traction bronchiolectasis, scattered tiny pulmonary nodules measuring 4 mm or less 02/02/2018-CT chest high-res-Spectrum of findings compatible for UIP, presumably on the basis of patient's history of rheumatoid arthritis, mild patchy air trapping, stable dilated main pulmonary  artery, traction bronchiectasis, tiny area of honeycombing in the right upper lobe 08/20/2018-CT chest high-res- severe tracheomalacia, interval progression of basilar predominant fibrotic interstitial lung disease with particularly increased groundglass component no honeycombing, 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 on the basis of patient's rheumatoid arthritis, stable dilated main pulmonary artery  Cardiac:  Nuclear stress test 05/20/2016-no evidence of significant blockage Echocardiogram 05/11/2016- WNL EF 00-92%, grade 1 diastolic dysfunction  Micro: 09/07/2016-sputum culture-abundant gram-positive cocci in pairs and clusters, scant growth of yeast   FENO:  Lab Results  Component Value Date   NITRICOXIDE 23 07/31/2018    PFT: PFT Results Latest Ref Rng & Units 10/01/2018 09/03/2015  FVC-Pre L 1.81 2.18  FVC-Predicted Pre % 72 84  FVC-Post L - 2.17  FVC-Predicted Post % - 83  Pre FEV1/FVC % % 75 79  Post FEV1/FCV % % - 83  FEV1-Pre L 1.36 1.73  FEV1-Predicted Pre % 73 88  FEV1-Post L - 1.80  DLCO UNC% % 69 73  DLCO COR %Predicted % 98 94  TLC L - 3.63  TLC % Predicted % - 78  RV % Predicted % - 61    Imaging: No results found.    Specialty Problems      Pulmonary Problems   ASTHMATIC BRONCHITIS, ACUTE    Qualifier: Diagnosis of  By: Annamaria Boots MD, Clinton D       Seasonal and perennial allergic rhinitis    Allergy vaccine 1:50,000 07/13/00; 1:10 06/11/08 GO       RHINOSINUSITIS, CHRONIC    Qualifier: Diagnosis of  By: Annamaria Boots MD, Clinton D       Asthmatic bronchitis, moderate persistent, uncomplicated    Office Spirometry 02/21/17-moderate restriction consistent with body habitus. FVC 1.70/66%, FEV1 1.49/77%, ratio 0.87, FEF 25-75% 2.39/146%.  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 on the basis of patient's rheumatoid arthritis, stable dilated main pulmonary artery  10/01/2018- spirometry with DLCO-FVC 1.81 (72% predicted), ratio 75, FEV1 1.36 (73% predicted), DLCO 69       Obstructive sleep apnea    Unattended Home Sleep Test 08/12/2016-AHI 18.2/hour, desaturation to 75% with average 88%, body weight 180 pounds CPAP titration sleep study 11/09/16.-6 cwp-  Needed supplemental oxygen because of sustained low saturation with 17.5 minutes recording saturation less than 88% on CPAP      Shortness of breath   Acute on chronic respiratory failure with hypoxia (HCC)   Bronchiectasis without complication ([place])    10/14/760-UQ chest high-res-Spectrum of findings compatible for UIP, presumably on the basis of patient's history of rheumatoid arthritis, mild patchy air trapping, stable dilated main pulmonary artery, traction bronchiectasis, tiny area of honeycombing in the right upper lobe  Pulmonary fibrosis (HCC)      Allergies  Allergen Reactions  . Cefdinir Diarrhea  . Erythromycin Base Diarrhea    Other  . Lactose Intolerance (Gi) Diarrhea  . Lactulose Diarrhea  . Mesalamine Nausea Only  . Nitrofuran Derivatives     shakiness  . Nitrofurantoin Other (See Comments)    shakiness other  . Other Diarrhea and Other (See Comments)    Shaking uncontrollably "lettuce only" "lettuce only"  . Sulfa Antibiotics Other (See Comments)    Patient can't recall reaction   . Tdap [Tetanus-Diphth-Acell Pertussis]     Shaking uncontrollably  . Tetanus Toxoid, Adsorbed Other (See Comments)     Shaking uncontrollable     Immunization History  Administered Date(s) Administered  . Influenza Split 03/18/2012, 03/18/2013, 04/14/2014, 04/17/2017  . Influenza Whole 04/30/2008, 04/20/2009, 04/18/2011  . Influenza, High Dose Seasonal PF 04/23/2015, 03/24/2016, 04/06/2018  . Pneumococcal Conjugate-13 11/11/2013  . Pneumococcal Polysaccharide-23 05/21/2018  . Zoster Recombinat (Shingrix) 05/05/2017    Past Medical History:  Diagnosis Date  . Acute asthmatic bronchitis   . Allergic rhinitis   . Anxiety   . Arthritis   . Esophageal reflux   . Hypertension   . Interstitial lung disease ([place]) dx jan 2020  . Irritable bowel syndrome   . Oxygen dependent    4 liters day time 6 liters at night  . PONV (postoperative nausea and vomiting)    ponv likes zofran, and scopolamine patch  . Rheumatoid arthritis(714.0)   . Sleep apnea     Tobacco History: Social History   Tobacco Use  Smoking Status Never Smoker  Smokeless Tobacco Never Used  Tobacco Comment   positive passive tobacco smoke exposure   Counseling given: Yes Comment: positive passive tobacco smoke exposure   Continue to not smoke  Outpatient Encounter Medications as of 02/11/2019  Medication Sig  . ALPRAZolam (XANAX) 0.5 MG tablet Take 0.5 mg by mouth as needed for anxiety.   [name] ELLIPTA 62.5-25 MCG/INH AEPB USE 1 INHALATION DAILY (Patient taking differently: Inhale 1 puff into the lungs daily. )  . benzonatate (TESSALON) 200 MG capsule TAKE 1 CAPSULE BY MOUTH 3 TIMES A DAY AS NEEDED FOR COUGH (Patient taking differently: Take 200 mg by mouth 3 (three) times daily as needed for cough. TAKE 1 CAPSULE BY MOUTH 3 TIMES A DAY AS NEEDED FOR COUGH)  . cetirizine (ZYRTEC) 10 MG tablet Take 10 mg by mouth daily.  . clidinium-chlordiazePOXIDE (LIBRAX) 5-2.5 MG capsule Take 1 capsule by mouth 2 (two) times daily as needed (for IBS).  . colestipol (COLESTID) 1 G tablet Take 1 g by mouth 2 (two) times daily.   . [facility] D3  5000 units capsule Take 5,000 Units by mouth daily.  Marland Kitchen docusate sodium (COLACE) 100 MG capsule Take 1 capsule (100 mg total) by mouth 2 (two) times daily.  . ferrous sulfate (FERROUSUL) 325 (65 FE) MG tablet Take 1 tablet (325 mg total) by mouth 3 (three) times daily with meals.  Marland Kitchen FLUoxetine (PROZAC) 20 MG capsule Take 20 mg by mouth daily.  . fluticasone (FLONASE) 50 MCG/ACT nasal spray Place 2 sprays into the nose daily.   . furosemide (LASIX) 40 MG tablet Take 1 tablet (40 mg total) by mouth 2 (two) times daily.  Marland Kitchen glycopyrrolate (ROBINUL) 1 MG tablet Take 1 mg by mouth 3 (three) times daily as needed (for IBS).   Marland Kitchen HYDROcodone-acetaminophen (NORCO) 7.5-325 MG tablet Take 1-2 tablets by mouth every 4 (four) hours  as needed for moderate pain.  . hydroxychloroquine (PLAQUENIL) 200 MG tablet Take 2 tablets (400 mg total) by mouth daily. Stop for 2 weeks, post-op.  . hydroxypropyl methylcellulose / hypromellose (ISOPTO TEARS / GONIOVISC) 2.5 % ophthalmic solution Place 1-2 drops into both eyes 3 (three) times daily as needed for dry eyes.  Marland Kitchen ipratropium (ATROVENT) 0.03 % nasal spray USE 1 TO 2 SPRAYS INTO BOTH NOSTRILS 2 TIMES DAILY  . ipratropium-albuterol (DUONEB) 0.5-2.5 (3) MG/3ML SOLN Use 1 vial in nebulizer every 6 hours as needed (Patient taking differently: Take 3 mLs by nebulization 2 (two) times a day. Use 1 vial in nebulizer every 6 hours as needed)  . leflunomide (ARAVA) 20 MG tablet Take 20 mg by mouth daily.   . methocarbamol (ROBAXIN) 500 MG tablet Take 1 tablet (500 mg total) by mouth every 6 (six) hours as needed for muscle spasms.  . mirtazapine (REMERON) 45 MG tablet Take 45 mg by mouth at bedtime.  . montelukast (SINGULAIR) 10 MG tablet TAKE 1 TABLET BY MOUTH AT  BEDTIME (Patient taking differently: Take 10 mg by mouth at bedtime. )  . Multiple Vitamin (MULTIVITAMIN) tablet Take 1 tablet by mouth daily.  Marland Kitchen omeprazole (PRILOSEC) 40 MG capsule Take 40 mg by mouth at bedtime.   .  OXYGEN Inhale 4-6 mLs into the lungs continuous. 4 during the day, 6 at night  . Pirfenidone (ESBRIET) 267 MG TABS Take 534-801 mg by mouth 3 (three) times daily. 534 mg in the am 801 at lunch 801 at night  . polyethylene glycol (MIRALAX / GLYCOLAX) 17 g packet Take 17 g by mouth 2 (two) times daily.  . potassium chloride SA (K-DUR) 20 MEQ tablet Take 1 tablet (20 mEq total) by mouth 2 (two) times daily.  Marland Kitchen PROAIR HFA 108 (90 Base) MCG/ACT inhaler USE 2 PUFFS BY MOUTH EVERY  6 HOURS AS NEEDED (Patient taking differently: Inhale 2 puffs into the lungs every 6 (six) hours as needed for wheezing or shortness of breath. )  . rosuvastatin (CRESTOR) 20 MG tablet Take 1 tablet (20 mg total) by mouth daily.  . verapamil (CALAN-SR) 240 MG CR tablet Take 1 tablet (240 mg total) by mouth daily.  . Fluticasone-Umeclidin-Vilant (TRELEGY ELLIPTA) 100-62.5-25 MCG/INH AEPB Inhale 1 puff into the lungs daily.  . Pirfenidone (ESBRIET) 801 MG TABS Take 801 mg by mouth daily.   No facility-administered encounter medications on file as of 02/11/2019.      Review of Systems  Review of Systems  Constitutional: Positive for fatigue. Negative for activity change and fever.  HENT: Negative for sinus pressure, sinus pain and sore throat.   Respiratory: Positive for shortness of breath. Negative for cough and wheezing.   Cardiovascular: Negative for chest pain and palpitations.  Gastrointestinal: Negative for diarrhea, nausea and vomiting.  Musculoskeletal: Negative for arthralgias.  Neurological: Negative for dizziness.  Psychiatric/Behavioral: Negative for sleep disturbance. The patient is not nervous/anxious.      Physical Exam  BP 128/72   Pulse 86   Temp 98.2 F (36.8 C) (Oral)   Ht _0  (1.549 m)   Wt 202 lb (91.6 kg)   SpO2 97%   BMI 38.17 kg/m   Wt Readings from Last 5 Encounters:  02/11/19 202 lb (91.6 kg)  12/20/18 208 lb (94.3 kg)  12/17/18 208 lb (94.3 kg)  11/23/18 200 lb (90.7 kg)   10/12/18 199 lb (90.3 kg)    Physical Exam Vitals signs and nursing note reviewed.  Constitutional:      General: She is not in acute distress.    Appearance: Normal appearance. She is obese.     Comments: Chronically ill elderly female  HENT:     Head: Normocephalic and atraumatic.     Right Ear: Tympanic membrane, ear canal and external ear normal. There is no impacted cerumen.     Left Ear: Tympanic membrane, ear canal and external ear normal. There is no impacted cerumen.     Nose: Nose normal. No congestion.     Mouth/Throat:     Mouth: Mucous membranes are moist.     Pharynx: Oropharynx is clear.  Eyes:     Pupils: Pupils are equal, round, and reactive to light.  Neck:     Musculoskeletal: Normal range of motion.  Cardiovascular:     Rate and Rhythm: Normal rate and regular rhythm.     Pulses: Normal pulses.     Heart sounds: Normal heart sounds. No murmur.  Pulmonary:     Effort: Pulmonary effort is normal. No respiratory distress.     Breath sounds: No decreased air movement. Rales (Bibasilar Rales) present. No decreased breath sounds or wheezing.  Musculoskeletal:     Right lower leg: No edema.     Left lower leg: No edema.  Skin:    General: Skin is warm and dry.     Capillary Refill: Capillary refill takes less than 2 seconds.  Neurological:     General: No focal deficit present.     Mental Status: She is alert and oriented to person, place, and time. Mental status is at baseline.     Gait: Gait normal.  Psychiatric:        Mood and Affect: Mood normal.        Behavior: Behavior normal.        Thought Content: Thought content normal.        Judgment: Judgment normal.      Lab Results:  CBC    Component Value Date/Time   WBC 9.8 12/22/2018 0326   RBC 2.99 (L) 12/22/2018 0326   HGB 8.9 (L) 12/22/2018 0326   HGB 14.4 09/23/2016 1252   HCT 29.8 (L) 12/22/2018 0326   HCT 44.7 09/23/2016 1252   PLT 172 12/22/2018 0326   PLT 342 09/23/2016 1252   MCV  99.7 12/22/2018 0326   MCV 88 09/23/2016 1252   MCH 29.8 12/22/2018 0326   MCHC 29.9 (L) 12/22/2018 0326   RDW 14.5 12/22/2018 0326   RDW 15.4 09/23/2016 1252   LYMPHSABS 1.0 12/17/2018 1349   LYMPHSABS 1.2 09/23/2016 1252   MONOABS 0.9 12/17/2018 1349   EOSABS 0.1 12/17/2018 1349   EOSABS 0.0 09/23/2016 1252   BASOSABS 0.0 12/17/2018 1349   BASOSABS 0.0 09/23/2016 1252    BMET    Component Value Date/Time   NA 138 12/22/2018 0326   NA 142 01/09/2017 1024   K 3.9 12/22/2018 0326   CL 104 12/22/2018 0326   CO2 29 12/22/2018 0326   GLUCOSE 106 (H) 12/22/2018 0326   BUN 17 12/22/2018 0326   BUN 18 01/09/2017 1024   CREATININE 0.79 12/22/2018 0326   CREATININE 0.91 05/30/2016 1002   CALCIUM 8.6 (L) 12/22/2018 0326   GFRNONAA >60 12/22/2018 0326   GFRAA >60 12/22/2018 0326    BNP No results found for: BNP  ProBNP    Component Value Date/Time   PROBNP 38.0 07/31/2018 1735      Assessment & Plan:  ILD (interstitial lung disease) ([place]) Plan: Continue Esbriet We will change Esbriet tablets from 267 mg tablets to 801 mg tablets Follow-up with [name] at patient's request in 6 weeks Continue follow-up with rheumatology Restart pulmonary rehab when able based off of COVID-19 restrictions Continue to work diligently on increasing physical activity and working on reducing weight  Obstructive sleep apnea Plan: Continue CPAP use Follow-up with Dr. Annamaria Boots at least yearly for management of obstructive sleep apnea  Wound infection after surgery Plan: Continue follow-up with orthopedics regarding surgical site infection Continue Keflex under management of orthopedics Likely need to consider wound culture Should have tentative follow-up with wound care if wound site non-closing  Bronchiectasis without complication ([place]) Plan Continue therapy vest Follow-up with our office in 6 weeks  Physical deconditioning Recently completed physical therapy Has pending  referral to pulmonary rehab on hold due to COVID-19 pandemic  Plan: Continue to increase physical activity Continue to work on reducing weight and BMI  Rheumatoid arthritis ([place]) Plan: Continue follow-up with rheumatology Continue Plaquenil and Arava as outlined by rheumatology  Pulmonary hypertension ([place]) Plan: We have referred the patient to heart failure clinic in the past.  Patient continues to see cardiologist in [facility].  Will refer again to heart failure clinic today.    Return in about 6 weeks (around 03/25/2019), or if symptoms worsen or fail to improve, for Follow up with Dr. Purnell Shoemaker.   Lauraine Rinne, NP 02/11/2019   This appointment was 32 minutes long with over 50% of the time in direct face-to-face patient care, assessment, plan of care, and follow-up.

## 2019-02-11 NOTE — Telephone Encounter (Signed)
Nothing further needed. Encounter closed.

## 2019-02-11 NOTE — Assessment & Plan Note (Signed)
Plan: Continue CPAP use Follow-up with Dr. Annamaria Boots at least yearly for management of obstructive sleep apnea

## 2019-02-11 NOTE — Assessment & Plan Note (Addendum)
Plan: Continue Esbriet We will change Esbriet tablets from 267 mg tablets to 801 mg tablets Follow-up with [name] at patient's request in 6 weeks Continue follow-up with rheumatology Restart pulmonary rehab when able based off of COVID-19 restrictions Continue to work diligently on increasing physical activity and working on reducing weight

## 2019-02-11 NOTE — Assessment & Plan Note (Signed)
Plan: We have referred the patient to heart failure clinic in the past.  Patient continues to see cardiologist in [facility].  Will refer again to heart failure clinic today.

## 2019-02-12 ENCOUNTER — Other Ambulatory Visit: Payer: Self-pay | Admitting: Internal Medicine

## 2019-02-12 ENCOUNTER — Telehealth: Payer: Self-pay | Admitting: Pulmonary Disease

## 2019-02-12 NOTE — Telephone Encounter (Signed)
I am not sure what this message is in regards to. LMTCB x1 for Natalie with [facility].

## 2019-02-13 ENCOUNTER — Other Ambulatory Visit (INDEPENDENT_AMBULATORY_CARE_PROVIDER_SITE_OTHER): Payer: Medicare Other

## 2019-02-13 DIAGNOSIS — J849 Interstitial pulmonary disease, unspecified: Secondary | ICD-10-CM | POA: Diagnosis not present

## 2019-02-13 DIAGNOSIS — Z5181 Encounter for therapeutic drug level monitoring: Secondary | ICD-10-CM | POA: Diagnosis not present

## 2019-02-13 LAB — COMPREHENSIVE METABOLIC PANEL
ALT: 13 U/L (ref 0–35)
AST: 17 U/L (ref 0–37)
Albumin: 4.1 g/dL (ref 3.5–5.2)
Alkaline Phosphatase: 81 U/L (ref 39–117)
BUN: 15 mg/dL (ref 6–23)
CO2: 28 mEq/L (ref 19–32)
Calcium: 10.3 mg/dL (ref 8.4–10.5)
Chloride: 106 mEq/L (ref 96–112)
Creatinine, Ser: 0.73 mg/dL (ref 0.40–1.20)
GFR: 77.75 mL/min (ref >60.00)
Glucose, Bld: 93 mg/dL (ref 70–99)
Potassium: 4.2 mEq/L (ref 3.5–5.1)
Sodium: 142 mEq/L (ref 135–145)
Total Bilirubin: 0.3 mg/dL (ref 0.2–1.2)
Total Protein: 6.7 g/dL (ref 6.0–8.3)

## 2019-02-13 MED ORDER — ESBRIET 801 MG PO TABS: 801.0000 mg | ORAL_TABLET | Freq: Three times a day (TID) | ORAL | 1 refills | Status: DC

## 2019-02-13 NOTE — Telephone Encounter (Signed)
[name], [facility], requesting update on Esbriet request. Cb is [phone] Option 2 Option 1

## 2019-02-13 NOTE — Progress Notes (Signed)
Labwork stable. Continue esbriet.   Wyn Quaker FNP

## 2019-02-13 NOTE — Telephone Encounter (Signed)
[facility] returning call and can be reached @ [phone].[name]

## 2019-02-13 NOTE — Telephone Encounter (Signed)
Pt is not with [facility], she gave is the wrong info. She is w/[facility] and is needing new script sent to them for her Esbriet there phone# is [phone] and if you have any question please call her son @ [phone].[name]

## 2019-02-13 NOTE — Telephone Encounter (Signed)
Called [facility] who is with [place] but the hold time was going to be longer than normal. Left message for someone from [facility] to return call. From pt's last OV with [name] 7/27, [name] was switching her to 828m from the 2691m  Prescription info can be seen in pt's med list. A verbal might be able to be given over the phone for pt's Esbriet.

## 2019-02-13 NOTE — Telephone Encounter (Signed)
Attempted to contact [facility]. Was placed on a long hold with no one coming to the line. Will try back.

## 2019-02-13 NOTE — Telephone Encounter (Signed)
[facility] is not the pharmacy that we are needing to call for pt's Esbriet. She was originally going to use [facility] for her Jacklynn Bue but was able to get patient assistance for her Esbriet through [place] and their pharmacy is [facility].  [facility] is who needs to be contacted as with [facility], they have that pt is going to be a new pt with them since pt never started getting her med from them.   Called [facility] and spoke Juliann Pulse stating to her that we were needing to provide prescription info for pt's Esbriet. Stated to her that we were switching pt from 263m to 8087m KaJuliann Pulseaid she would have me speak with a pharmacist to get this taken care of.   Spoke to St[employer] O[employer]haphSoftware engineerroviding him new info for the 80186msbriet. Per SteAnnie Main had already had a shipment of the 267m16mnt to day but the next time she was needing a refill it would be the 801mg43mthing further needed.

## 2019-02-17 ENCOUNTER — Other Ambulatory Visit: Payer: Self-pay | Admitting: Internal Medicine

## 2019-02-17 DIAGNOSIS — I2510 Atherosclerotic heart disease of native coronary artery without angina pectoris: Secondary | ICD-10-CM

## 2019-02-18 ENCOUNTER — Telehealth: Payer: Self-pay | Admitting: Internal Medicine

## 2019-02-18 MED ORDER — IPRATROPIUM-ALBUTEROL 0.5-2.5 (3) MG/3ML IN SOLN: RESPIRATORY_TRACT | 5 refills | Status: DC

## 2019-02-18 NOTE — Telephone Encounter (Signed)
Called and spoke with Patient.  [facility] stated [facility] had [facility]ills for duoneb.  Patient requested refill for Duo neb to be sent to [place].  Refill request sent to requested pharmacy.  Nothing further at this time.

## 2019-03-07 ENCOUNTER — Other Ambulatory Visit: Payer: Self-pay

## 2019-03-07 ENCOUNTER — Telehealth (INDEPENDENT_AMBULATORY_CARE_PROVIDER_SITE_OTHER): Payer: Medicare Other | Admitting: Internal Medicine

## 2019-03-07 ENCOUNTER — Encounter: Payer: Self-pay | Admitting: Internal Medicine

## 2019-03-07 VITALS — BP 138/75 | HR 76 | Temp 97.3°F | Ht 61.0 in | Wt 202.0 lb

## 2019-03-07 DIAGNOSIS — J9611 Chronic respiratory failure with hypoxia: Secondary | ICD-10-CM | POA: Diagnosis not present

## 2019-03-07 DIAGNOSIS — J8410 Pulmonary fibrosis, unspecified: Secondary | ICD-10-CM | POA: Diagnosis not present

## 2019-03-07 DIAGNOSIS — G4730 Sleep apnea, unspecified: Secondary | ICD-10-CM

## 2019-03-07 DIAGNOSIS — I5032 Chronic diastolic (congestive) heart failure: Secondary | ICD-10-CM | POA: Diagnosis not present

## 2019-03-07 DIAGNOSIS — I2510 Atherosclerotic heart disease of native coronary artery without angina pectoris: Secondary | ICD-10-CM | POA: Diagnosis not present

## 2019-03-07 NOTE — Patient Instructions (Signed)
Medication Instructions:  Your physician recommends that you continue on your current medications as directed. Please refer to the Current Medication list given to you today.  If you need a refill on your cardiac medications before your next appointment, please call your pharmacy.   Lab work: - None ordered.  If you have labs (blood work) drawn today and your tests are completely normal, you will receive your results only by: Marland Kitchen MyChart Message (if you have MyChart) OR . A paper copy in the mail If you have any lab test that is abnormal or we need to change your treatment, we will call you to review the results.  Testing/Procedures: - None ordered.   Follow-Up: At [facility], you and your health needs are our priority.  As part of our continuing mission to provide you with exceptional heart care, we have created designated Provider Care Teams.  These Care Teams include your primary Cardiologist (physician) and Advanced Practice Providers (APPs -  Physician Assistants and Nurse Practitioners) who all work together to provide you with the care you need, when you need it. You will need a follow up appointment in 4 months IN OFFICE.  Please call our office 2 months in advance to schedule this appointment.  You may see Nelva Bush, MD or one of the following Advanced Practice Providers on your designated Care Team:   Murray Hodgkins, NP Christell Faith, PA-C . Marrianne Mood, PA-C

## 2019-03-07 NOTE — Progress Notes (Signed)
Virtual Visit via Telephone Note   This visit type was conducted due to national recommendations for restrictions regarding the COVID-19 Pandemic (e.g. social distancing) in an effort to limit this patient's exposure and mitigate transmission in our community.  Due to her co-morbid illnesses, this patient is at least at moderate risk for complications without adequate follow up.  This format is felt to be most appropriate for this patient at this time.  The patient did not have access to video technology/had technical difficulties with video requiring transitioning to audio format only (telephone).  All issues noted in this document were discussed and addressed.  No physical exam could be performed with this format.  Please refer to the patient's chart for her  consent to telehealth for [facility].   Date:  03/07/2019   ID:  [name], DOB 11-03-1945, MRN [id number]  Patient Location: Home Provider Location: Office  PCP:  Leonard Downing, MD  Cardiologist:  Nelva Bush, MD  Electrophysiologist:  None   Evaluation Performed:  Follow-Up Visit  Chief Complaint:  Shortness of breath  History of Present Illness:    [name] is a 73 y.o. female with history of nonobstructive coronary artery disease, asthmatic bronchitis, GERD, rheumatoid arthritis, CVA, prediabetes, IBS, and allergic rhinitis.  We last spoke in early May, at which time [name] reported continued decline in her breathing.  She also suffered a stress fracture of the right hip for which she subsequently underwent surgical management by Dr. Alvan Dame.  She was seen in the pulmonary clinic last month and reported improvement in her breathing since initiation of Esbriet.  Today, [name] reports that she feels better than when we last spoke.  Her breathing has improved considerably with continued use of Esbriet.  She remains on supplemental oxygen around-the-clock and is somewhat discouraged that she will need to be  on this indefinitely.  She is relatively sedentary owing to her recent right hip replacement.  However, she has been working with physical therapy some.  She denies chest pain, palpitations, lightheadedness, orthopnea, and PND.  She has chronic left foot/ankle swelling, which is unchanged from baseline.  Ms. Dieguez still has 2 small wounds along the right hip that have not completely healed following arthroplasty.  She was referred to the [facility] clear clinic but has yet to be contacted by them.  She is scheduled for follow-up with Dr. Alvan Dame next week.  The patient does not have symptoms concerning for COVID-19 infection (fever, chills, cough, or new shortness of breath).    Past Medical History:  Diagnosis Date  . Acute asthmatic bronchitis   . Allergic rhinitis   . Anxiety   . Arthritis   . Esophageal reflux   . Hypertension   . Interstitial lung disease ([place]) dx jan 2020  . Irritable bowel syndrome   . Oxygen dependent    4 liters day time 6 liters at night  . PONV (postoperative nausea and vomiting)    ponv likes zofran, and scopolamine patch  . Rheumatoid arthritis(714.0)   . Sleep apnea    Past Surgical History:  Procedure Laterality Date  .  c secttion  1977  . 2 foot fusions Left    total of 6 left foot sx  . ABDOMINAL HYSTERECTOMY     complete   . ANKLE FUSION  2009   left  . BACK SURGERY     lower l to l 5 fused  . CHOLECYSTECTOMY    .  RIGHT/LEFT HEART CATH AND CORONARY ANGIOGRAPHY N/A 09/16/2016   Procedure: Right/Left Heart Cath and Coronary Angiography;  Surgeon: Nelva Bush, MD;  Location: [place] [facility];  Service: Cardiovascular;  Laterality: N/A;  . TONSILLECTOMY    . TOTAL HIP ARTHROPLASTY Right 12/20/2018   Procedure: TOTAL HIP ARTHROPLASTY ANTERIOR APPROACH;  Surgeon: Paralee Cancel, MD;  Location: [facility];  Service: Orthopedics;  Laterality: Right;  70 mins  . TOTAL KNEE ARTHROPLASTY Bilateral      Current Meds  Medication Sig  . ALPRAZolam (XANAX) 0.5  MG tablet Take 0.5 mg by mouth as needed for anxiety.   . benzonatate (TESSALON) 200 MG capsule TAKE 1 CAPSULE BY MOUTH 3 TIMES A DAY AS NEEDED FOR COUGH (Patient taking differently: Take 200 mg by mouth 3 (three) times daily as needed for cough. TAKE 1 CAPSULE BY MOUTH 3 TIMES A DAY AS NEEDED FOR COUGH)  . cetirizine (ZYRTEC) 10 MG tablet Take 10 mg by mouth daily.  . clidinium-chlordiazePOXIDE (LIBRAX) 5-2.5 MG capsule Take 1 capsule by mouth 2 (two) times daily as needed (for IBS).  . colestipol (COLESTID) 1 G tablet Take 1 g by mouth 2 (two) times daily.   Marland Kitchen docusate sodium (COLACE) 100 MG capsule Take 1 capsule (100 mg total) by mouth 2 (two) times daily.  Marland Kitchen FLUoxetine (PROZAC) 20 MG capsule Take 20 mg by mouth daily.  . fluticasone (FLONASE) 50 MCG/ACT nasal spray Place 2 sprays into the nose daily.   . Fluticasone-Umeclidin-Vilant (TRELEGY ELLIPTA) 100-62.5-25 MCG/INH AEPB Inhale 1 puff into the lungs daily.  . Fluticasone-Umeclidin-Vilant (TRELEGY ELLIPTA) 100-62.5-25 MCG/INH AEPB Inhale 1 puff into the lungs daily.  . furosemide (LASIX) 40 MG tablet Take 1 tablet (40 mg total) by mouth 2 (two) times daily. (Patient taking differently: Take 40 mg by mouth daily. )  . glycopyrrolate (ROBINUL) 1 MG tablet Take 1 mg by mouth 3 (three) times daily as needed (for IBS).   . hydroxychloroquine (PLAQUENIL) 200 MG tablet Take 2 tablets (400 mg total) by mouth daily. Stop for 2 weeks, post-op.  Marland Kitchen ipratropium (ATROVENT) 0.03 % nasal spray USE 1 TO 2 SPRAYS INTO BOTH NOSTRILS 2 TIMES DAILY  . ipratropium-albuterol (DUONEB) 0.5-2.5 (3) MG/3ML SOLN Use 1 vial in nebulizer every 6 hours as needed  . leflunomide (ARAVA) 20 MG tablet Take 20 mg by mouth daily.   . methocarbamol (ROBAXIN) 500 MG tablet Take 1 tablet (500 mg total) by mouth every 6 (six) hours as needed for muscle spasms.  . mirtazapine (REMERON) 45 MG tablet Take 45 mg by mouth at bedtime.  . montelukast (SINGULAIR) 10 MG tablet TAKE 1  TABLET BY MOUTH AT  BEDTIME  . Multiple Vitamin (MULTIVITAMIN) tablet Take 1 tablet by mouth daily.  Marland Kitchen omeprazole (PRILOSEC) 40 MG capsule Take 40 mg by mouth at bedtime.   . OXYGEN Inhale 4-6 mLs into the lungs continuous. 4 during the day, 6 at night  . Pirfenidone (ESBRIET) 801 MG TABS Take 801 mg by mouth 3 (three) times daily.  . polyethylene glycol (MIRALAX / GLYCOLAX) 17 g packet Take 17 g by mouth 2 (two) times daily.  . potassium chloride SA (K-DUR) 20 MEQ tablet Take 1 tablet (20 mEq total) by mouth 2 (two) times daily.  Marland Kitchen PROAIR HFA 108 (90 Base) MCG/ACT inhaler USE 2 PUFFS BY MOUTH EVERY  6 HOURS AS NEEDED (Patient taking differently: Inhale 2 puffs into the lungs every 6 (six) hours as needed for wheezing or shortness of  breath. )  . rosuvastatin (CRESTOR) 20 MG tablet TAKE 1 TABLET BY MOUTH  DAILY  . verapamil (CALAN-SR) 240 MG CR tablet Take 1 tablet (240 mg total) by mouth daily.     Allergies:   Cefdinir; Erythromycin base; Lactose; Lactose intolerance (gi); Lactulose; Mesalamine; Nitrofuran derivatives; Nitrofurantoin; Other; Sulfa antibiotics; Tdap [tetanus-diphth-acell pertussis]; and Tetanus toxoid, adsorbed   Social History   Tobacco Use  . Smoking status: Never Smoker  . Smokeless tobacco: Never Used  . Tobacco comment: positive passive tobacco smoke exposure  Substance Use Topics  . Alcohol use: Yes    Comment: glass of wine each night   . Drug use: No     Family Hx: The patient's family history includes Arthritis in her mother; Diabetes in an other family member; Heart attack in her father and another family member; Heart disease in her mother.  ROS:   Please see the history of present illness.   All other systems reviewed and are negative.   Prior CV studies:   The following studies were reviewed today:  ABI's (07/30/18):1.0 on the right and 1.1 on the left. TBI's 0.62 on the right and 0.61 on the left. Findings are normal.  L/RHC (09/16/16): LMCA  normal. LAD with 50% midvessel stenosis. LCx normal. 20% proximal RCA. LVEDP 15. RA 13, RV 38/12, PA 43/23 (32), PCWP 16. Ventricular concordance noted. Fick CO/CI 6.4/3.7. PA sat 70%, RA sat 75%, Ao sat 94%. PVR 2.5 Wood Units.  Carotid Doppler (10/26/16):Heterogeneous plaque, bilaterally. Tortuous mid and distal ICA's, bilaterally. 1-39% bilateral ICA stenosis. Normal subclavian arteries, bilaterally. Patent vertebral arteries with antegrade flow.Subcentimeter hypoechoic nodule in the thyroid.  Transthoracic echocardiogram (05/11/16): Normal LV size and in traction (EF 55-60%). Normal LV wall motion. Grade 1 diastolic. Normal RV size and function. Mild mitral valve thickening. Mild TR.  Pharmacologic myocardial perfusion stress test (05/11/16): Low risk study without ischemia or scar. LVEF 62%.  Labs/Other Tests and Data Reviewed:    EKG:  No ECG reviewed.  Recent Labs: 07/31/2018: Pro B Natriuretic peptide (BNP) 38.0 12/22/2018: Hemoglobin 8.9; Platelets 172 02/13/2019: ALT 13; BUN 15; Creatinine, Ser 0.73; Potassium 4.2; Sodium 142   Recent Lipid Panel Lab Results  Component Value Date/Time   CHOL 127 01/09/2017 10:24 AM   TRIG 181 (H) 01/09/2017 10:24 AM   HDL 53 01/09/2017 10:24 AM   CHOLHDL 2.4 01/09/2017 10:24 AM   LDLCALC 38 01/09/2017 10:24 AM   LDLDIRECT 53 01/09/2017 10:24 AM    Wt Readings from Last 3 Encounters:  03/07/19 202 lb (91.6 kg)  02/11/19 202 lb (91.6 kg)  12/20/18 208 lb (94.3 kg)     Objective:    Vital Signs:  BP 138/75   Pulse 76   Temp (!) 97.3 F (36.3 C)   Ht _0  (1.549 m)   Wt 202 lb (91.6 kg)   SpO2 95%   BMI 38.17 kg/m    VITAL SIGNS:  reviewed  ASSESSMENT & PLAN:    Chronic respiratory failure with hypoxia as well as chronic HFpEF and pulmonary fibrosis: Most likely due to underlying pulmonary fibrosis and sleep apnea.  Previous cardiac work-up notable for mild to moderate pulmonary hypertension and hemodynamics suggestive of  some restrictive physiology.  Given symptomatic improvement, I do not recommend any treatment changes today.  Ms. Mccuen should continue to follow closely with pulmonology for management of her interstitial lung disease.  We will continue current doses of furosemide and verapamil.  Nonobstructive coronary artery disease:  No symptoms to suggest progression of coronary disease.  Continue medical therapy, including aspirin and rosuvastatin.  COVID-19 Education: The signs and symptoms of COVID-19 were discussed with the patient and how to seek care for testing (follow up with PCP or arrange E-visit).  The importance of social distancing was discussed today.  Time:   Today, I have spent 13 minutes with the patient with telehealth technology discussing the above problems.     Medication Adjustments/Labs and Tests Ordered: Current medicines are reviewed at length with the patient today.  Concerns regarding medicines are outlined above.   Tests Ordered: No orders of the defined types were placed in this encounter.   Medication Changes: No orders of the defined types were placed in this encounter.   Follow Up:  In Person in 4 month(s)  Signed, Nelva Bush, MD  03/07/2019 11:11 AM    [place]

## 2019-03-15 ENCOUNTER — Encounter: Payer: Self-pay | Admitting: General Surgery

## 2019-03-15 ENCOUNTER — Ambulatory Visit (INDEPENDENT_AMBULATORY_CARE_PROVIDER_SITE_OTHER): Payer: Medicare Other | Admitting: Internal Medicine

## 2019-03-15 ENCOUNTER — Encounter: Payer: Self-pay | Admitting: Internal Medicine

## 2019-03-15 ENCOUNTER — Other Ambulatory Visit: Payer: Self-pay

## 2019-03-15 VITALS — BP 140/88 | HR 82 | Temp 98.0°F | Ht 61.0 in | Wt 207.0 lb

## 2019-03-15 DIAGNOSIS — Z5181 Encounter for therapeutic drug level monitoring: Secondary | ICD-10-CM | POA: Diagnosis not present

## 2019-03-15 DIAGNOSIS — M069 Rheumatoid arthritis, unspecified: Secondary | ICD-10-CM

## 2019-03-15 DIAGNOSIS — Z23 Encounter for immunization: Secondary | ICD-10-CM

## 2019-03-15 DIAGNOSIS — J9611 Chronic respiratory failure with hypoxia: Secondary | ICD-10-CM | POA: Diagnosis not present

## 2019-03-15 DIAGNOSIS — I2510 Atherosclerotic heart disease of native coronary artery without angina pectoris: Secondary | ICD-10-CM

## 2019-03-15 DIAGNOSIS — J849 Interstitial pulmonary disease, unspecified: Secondary | ICD-10-CM

## 2019-03-15 LAB — HEPATIC FUNCTION PANEL
ALT: 15 U/L (ref 0–35)
AST: 18 U/L (ref 0–37)
Albumin: 4.2 g/dL (ref 3.5–5.2)
Alkaline Phosphatase: 89 U/L (ref 39–117)
Bilirubin, Direct: 0.1 mg/dL (ref 0.0–0.3)
Total Bilirubin: 0.3 mg/dL (ref 0.2–1.2)
Total Protein: 6.8 g/dL (ref 6.0–8.3)

## 2019-03-15 MED ORDER — ESBRIET 801 MG PO TABS: 801.0000 mg | ORAL_TABLET | Freq: Three times a day (TID) | ORAL | 1 refills | Status: DC

## 2019-03-15 NOTE — Progress Notes (Signed)
05/21/2018- 74 year old female never smoker followed for allergic rhinitis, rhinosinusitis, asthmatic bronchitis, ILD, OSA,complicated by GERD/Crohn's, Rheumatoid arthritis/prednisone -----4 month follow up for asthma and OSA. Per patient she is still having issues with SOB. She has had 2 foot surgeries since her last visit. States that the [name] is working but [name] wanted to switch her to something else. Unsure of the medication name and I did not see it in his last OV.  Singulair, and Anoro, neb duoneb CPAP auto 5-15/Advanced   Download 100% compliance AHI 1.3/hour. I discussed FENO and she will try Symbicort. Discussed inhaled steroids.  We need to get her a new AeroChamber. Has been on sustained antibiotics for failed appliance in her foot. Notices some runny nose and scant bland mucus.  Wheezing has been well controlled.  Little cough.  She and her husband recognize inability to get any exercise as a substantial reason for dyspnea.  No acute events.  Cardiology continues to follow, including her pulmonary hypertension.  We reviewed her most recent chest CT and discussed implications of progressive interstitial fibrosis.  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  I would like to refer her to [name] for his opinion and advice. FENO 03/15/18  49 H CT chest Hi Res 02/02/2018- IMPRESSION: 1. Spectrum of findings compatible with fibrotic interstitial lung disease with a slight basilar predominance, with mild progression since 2017. 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 2. Mild patchy air trapping, unchanged, indicative of small airways disease. 3. Left main and two-vessel coronary atherosclerosis. 4. Stable dilated main pulmonary artery, suggesting chronic pulmonary arterial hypertension. Aortic Atherosclerosis (ICD10-I70.0).  06/08/18- 73 year old  female never smoker followed for allergic rhinitis, rhinosinusitis, asthmatic bronchitis, ILD, OSA,complicated by GERD/Crohn's, Rheumatoid arthritis/prednisone -----coughing for 2 weeks, yellow, green mucus, SOB  Singulair, Atrovent nasal spray, Symbicort 160, and Anoro, neb albuterol, neb DuoNeb, Complains of increased cough with chest congestion, green/yellow sputum, low-grade fever 99 degrees over the last 2 weeks.  Not on prednisone now.  Last antibiotic in October. Pending ILD consultation with [name] in December.   07/02/2018  - Visit   73 year old female patient presenting today for acute visit.  Patient was recently treated with a course of Augmentin and reports sputum color is now light green but has decreased in sputum amount since last office visit.  Patient does not feel she is getting better.  Patient remains adherent to Anoro Ellipta.  On arrival to our office today patient was short of breath and oxygen saturations were 81%.  With deep breathing patient was able to increase oxygenation to 85%.  When placed on 2 L O2 patient was able to maintain at 91%.  Patient had previously been on oxygen in 2015 but this was later [facility].  Patient's weight has also increased over the previous office visits, patient remains adherent to diuretic treatment plan as on taking 1 diuretic daily.  Patient reports she is allowed to take an additional diuretic when she feels necessary.  Unfortunately patient has not been weighing herself regularly there is no set plan to when she should take her second diuretic.  Patient is exceptionally sedentary at home and rarely gets out of her bed or lazy boy chair which is about 3 feet from her bed.  Patient's husband takes care of patient completely including getting drinks and food so patient does not have to leave her lazy boy recliner.  Patient  watches many television shows and does not report much activity.   Patient has completed follow-up with rheumatology last  week and no changes to her medications were made.  Patient reports that she was stable at the time.  CPAP compliance report showing 30 out of 30 days used.  All 30 those days greater than 4 hours.  APAP settings 5-15.  Average usage days 7 hours and 56 minutes.  AHI 0.8.    OV 07/31/2018 -referred to interstitial lung disease clinic  Subjective:  [name], female , DOB: 06-24-1946 , age 77 y.o. , MRN: [id number] , ADDRESS: [address] [place] [address]   07/31/2018 -   Chief Complaint  Patient presents with   Follow-up    Pt of [name] that stated to be referred to ILD clinic.  Pt currently has complaints of wheezing which she has had since December 2019, worsening SOB when moving around and also states she has a pain in left side of chest near shoulder. Pt wears between 2-4L O2 but mainly has been wearing 4L at home.      HPI [name] 73 y.o. -accompanied by her husband.  Referred by [name] pulmonologist in our practice for evaluation of interstitial lung disease in the setting of rheumatoid arthritis.  History is gained from her, talking to her husband, review of recent office visits and also the interstitial lung disease questionnaire.  As best as I can gather   [place] ILD Questionnaire  Symptoms: She is known to have ILD on a CT chest in 2017 but she is not aware of it.  She says that she follows normally for sleep apnea and asthma with [name].  She recollects that approximately 11 or 12 years ago she was hospitalized for pneumonia and was on oxygen for short while not otherwise specified.  Then came off oxygen.  After that overall she is been stable and sedentary using her CPAP.  She is mostly sedentary because of obesity, rheumatoid arthritis and also because of the nonhealing ulcer in her feet for which she is been advised sedentary lifestyle to enable healing.  Most recently to Thanksgiving 2019  she was seen for respiratory exacerbation and given antibiotics and prednisone.  She followed up mid December 2019 and was found to have new onset hypoxemia 85% on room air.  She was then discharged on portable oxygen which she is using 4 L nasal cannula at rest.  She tells me that at home when she comes off of CPAP she is in her 17s on room air at rest.  In fact in the office today she is 83% on room air at rest but 93% on 2 L nasal cannula at rest.  There is concerned that she has worsening ILD particularly summer 2019 on her CT chest had progressive findings compared to 2 years earlier.  The presumption is that this ILD is caused by rheumatoid arthritis.  In the 2019 CT chest CT is reported as probable UIP but in my personal visualization is either indeterminate or alternative diagnosis.  In terms of symptoms: She reports insidious onset of shortness of breath for some years and gradually getting worse.  Level 2 dyspnea at rest.  Level for dyspnea taking a shower.  Level 5 dyspnea walking at her own pace of walking with others of her age and walking up stairs or walking up a hill.  She does have a cough with  yellow and green sputum.  She does cough at night.  There is also wheezing  Past Medical History : She has longstanding history of rheumatoid arthritis since the 1980s.  Used to be followed by Dr. Danie Binder.  As best as I can gather she used to be on methotrexate maybe 10 or 20 years ago.  She took it for a short while and then stopped it because of diarrhea.  She took this for less than 1 year.  She believes that after that she was basically on pain management.  She is only been on prednisone chronic intake for a total of 1 year approximately 4 to 5 years ago.  Around this time Dr. Hoyt Koch retired and she started seeing Dr. Gavin Pound.  Since then she is been on Arava and Plaquenil.  She has never seen any other immunomodulators in the setting of rheumatoid arthritis according to  history.  Overall the rheumatoid arthritis has caused some disability with deformed joints in her hands and also her using cane.    She is not on any anticoagulation is never had any blood clots.  In 2017 she had vascular extremity duplex venous that was negative for DVT.  Most recently she is been dealing with nonhealing ulcer of the left toe/metatarsal .  She had August and September 2018.  And then 1 in the latter part of 2019.  Most recently she says she has had a duplex lower extremity venous.  I do not have access to these results.  She is waiting on this.  Was done in the last few to several days.   She also has a longstanding history of asthma for which she is on Singulair.  Exam nitric oxide was elevated in August 2019 at 49 ppb but 07/31/2018 I snormal in the 20s though she is wheezing   She has nonobstructive coronary artery disease and in March 2018 had a right heart catheterization with elevated pulmonary pressures but also slightly elevated wedge pressure [see below].  She is started on Lasix  She has sleep apnea for which she uses CPAP.  She has obesity  ROS: Positive for arthralgia and arthritis.  Dry eyes and dry mouth but otherwise negative   FAMILY HISTORY of LUNG DISEASE: Denies   EXPOSURE HISTORY: Never smoked any cigarettes, marijuana, vaping, cocaine or intravenous drug use   HOME and HOBBY DETAILS : [place] lived [place] for 8 years.  No mold.  No dampness.  Does use humidifier and does use CPAP but there is no mold in it.  Also uses nebulizer machine but there is no mold.  No pet birds no musical instruments no guarding habits   OCCUPATIONAL HISTORY (122 questions) : Denies   PULMONARY TOXICITY HISTORY (27 items): She has taken nitrofurantoin 9 years ago.  Has not taken methotrexate for a year 5 years ago and is taken sulfasalazine       Results for BETH, GOODLIN (MRN [id number]) as of 07/31/2018 16:31  Ref. Range 09/03/2015 12:45  02/21/2017  FVC-Pre Latest Units: L 2.18 1.7L  FVC-%Pred-Pre Latest Units: % 84 66%   Results for JURNEY, OVERACKER (MRN [id number]) as of 07/31/2018 16:31  Ref. Range 09/03/2015 12:45  DLCO cor Latest Units: ml/min/mmHg 14.06  DLCO cor % pred Latest Units: % 69   HEART CATH MARCH 2018 Right Heart Pressures RA (mean): 13 mmHg RV (S/EDP): 38/12 mmHg PA (S/D, mean): 43/23 (32) mmHg PCWP (mean): 16 mmHg  Conclusions: 1. Mild to moderate, non-obstructive coronary artery disease with 50% mid LAD and 20% proximal RCA stenoses. 2. Mild to moderate pulmonary hypertension with mildly elevated right heart filling pressures. 3. Upper normal left heart filling pressures. 4. Normal Fick cardiac output/index. 5. Equalization of end-diastolic pressures with ventricular concordance. This can be seen in the setting of restrictive process.  Recommendations: 1. Escalate statin therapy to prevent progression of CAD. 2. Increase furosemide to 40 mg BID and KCl to 40 mg BID. Patient to have BMP in 1 week to evaluate renal function and potassium. 3. Continue outpatient pulmonary follow-up. I suspect underlying lung disease is the driving force behind the patient's dyspnea, pulmonary hypertension, and elevated right heart pressures. 4. Outpatient follow-up in cardiology clinic in ~6 weeks.  Nelva Bush, MD   CT chest high res July 2019 - visualized personally - personaly opinion - indeterminate or alt dx for UIP but agree with progression  IMPRESSION: Lungs/Pleura: No pneumothorax. No pleural effusion. No acute consolidative airspace disease or lung masses. Few scattered small solid pulmonary nodules in mid to upper right lung, largest 4 mm in the right upper lobe (series 3/image 46), unchanged since 03/14/2016 CT, considered benign. No new significant pulmonary nodules. Mild patchy air trapping in both lungs on the expiration sequence, unchanged. Patchy peribronchovascular and subpleural  reticulation and ground-glass attenuation throughout both lungs with associated mild traction bronchiectasis and mild architectural distortion. There is a slight basilar gradient to these findings. Tiny focus of honeycombing in anterior right upper lobe (series 8/image 59). These findings have mildly worsened since 03/14/2016 chest CT. 1. Spectrum of findings compatible with fibrotic interstitial lung disease with a slight basilar predominance, with mild progression since 2017. 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 2. Mild patchy air trapping, unchanged, indicative of small airways disease. 3. Left main and two-vessel coronary atherosclerosis. 4. Stable dilated main pulmonary artery, suggesting chronic pulmonary arterial hypertension.  Aortic Atherosclerosis (ICD10-I70.0).   Electronically Signed   By: Ilona Sorrel M.D.   On: 02/02/2018 14:03        OV 03/15/2019  Subjective:  [name], female , DOB: 1946/01/08 , age 25 y.o. , MRN: [id number] , AD[address]ESS: [address] [place] [address]   03/15/2019 -   Chief Complaint  Patient presents with   IPD (Interstitial pulmonary disease)    Breathing is doing better since being on Esbriet. Has productive cough with green phelgm. Has been on antibiotic for last 6 weeks. Had hip replacement in June.     HPI [name] 73 y.o. 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  Started on Esbriet in March 2020.  Has progressive phenotype.   She presents for routine follow-up with her husband.  She tells me that ever since starting Esbriet in the spring 2020 she has been doing well.  She does not feel that the disease has progressed.  In fact she feels somewhat better.  She continues to use 4 L of oxygen.  She is now taking the Esbriet at 1 pill 3 times  daily.  This is the full dose of 1 pill.  Her last pulmonary function test was in early 2020.  It has been a while since she did her liver function test and she requires a repeat of that today.  She is willing to have her flu shot today.  She is  asking for prescription for emergency electric generator.  She does not know about the [employer] energy assistance program.  She has a handicap sticker.  She is interested in the ILD-PRO research registry study.    She is on Trelegy inhaler and Singulair I am not fully clear why   In June 2020 she had hip replacement for stress fracture and was on 6 weeks of Keflex.  She is slowly improving.  She uses a walker.     SYMPTOM SCALE - ILD 03/15/2019   O2 use x  Shortness of Breath 0 -> 5 scale with 5 being worst (score 6 If unable to do)  At rest 0  Simple tasks - showers, clothes change, eating, shaving 0  Household (dishes, doing bed, laundry) 5  Shopping 5  Walking level at own pace 3  Walking keeping up with others of same age 10  Walking up Stairs 5  Walking up [place] 5  Total (40 - 48) Dyspnea Score 25  How bad is your cough? 2  How bad is your fatigue 5    Simple office walk 185 feet x  3 laps goal with forehead probe 03/15/2019 - uses 4L [place] at home   O2 used Room air  Number laps completed 1/2 lap ad desat to 88%  Comments about pace   Resting Pulse Ox/HR   Final Pulse Ox/HR   Desaturated </= 88%   Desaturated <= 3% points   Got Tachycardic >/= 90/min   Symptoms at end of test   Miscellaneous comments -walked with 4L [place] - uses 4L [place] at home       Results for YAZLEEN, MOLOCK (MRN [id number]) as of 03/15/2019 11:39  Ref. Range 09/03/2015 12:45 10/01/2018 14:45  FVC-Pre Latest Units: L 2.18 1.81  FVC-%Pred-Pre Latest Units: % 84 72  Results for AUNIKA, KIRSTEN (MRN [id number]) as of 03/15/2019 11:39  Ref. Range 09/03/2015 12:45 10/01/2018 14:45  DLCO unc Latest Units: ml/min/mmHg 14.82 11.94  DLCO unc % pred Latest Units: % 73 69    IMPRESSION: 1. Severe tracheobronchomalacia, more pronounced on today's scan. 2. Interval progression of basilar predominant fibrotic interstitial lung disease with particularly increased ground-glass component. No honeycombing. 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 Findings are consistent with UIP per consensus guidelines: Diagnosis of Idiopathic Pulmonary Fibrosis: An Official ATS/ERS/JRS/ALAT Clinical Practice Guideline. [place], Iss 5, [phone], Mar 18 2017. 3. Stable dilated main pulmonary artery, suggesting chronic pulmonary arterial hypertension. 4. Left main and 3 vessel coronary atherosclerosis.  Aortic Atherosclerosis (ICD10-I70.0).   Electronically Signed   By: Ilona Sorrel M.D.   On: 08/20/2018 08:21  ROS - per HPI     has a past medical history of Acute asthmatic bronchitis, Allergic rhinitis, Anxiety, Arthritis, Esophageal reflux, Hypertension, Interstitial lung disease ([place]) (dx jan 2020), Irritable bowel syndrome, Oxygen dependent, PONV (postoperative nausea and vomiting), Rheumatoid arthritis(714.0), and Sleep apnea.   reports that she has never smoked. She has never used smokeless tobacco.  Past Surgical History:  Procedure Laterality Date    c secttion  1977   2 foot fusions Left    total of 6 left foot sx   ABDOMINAL HYSTERECTOMY     complete    ANKLE FUSION  2009   left   BACK SURGERY     lower l to l 5 fused   CHOLECYSTECTOMY     RIGHT/LEFT  HEART CATH AND CORONARY ANGIOGRAPHY N/A 09/16/2016   Procedure: Right/Left Heart Cath and Coronary Angiography;  Surgeon: Nelva Bush, MD;  Location: [facility];  Service: Cardiovascular;  Laterality: N/A;   TONSILLECTOMY     TOTAL HIP ARTHROPLASTY Right 12/20/2018   Procedure: TOTAL HIP ARTHROPLASTY ANTERIOR APPROACH;  Surgeon: Paralee Cancel, MD;  Location: [facility];  Service:  Orthopedics;  Laterality: Right;  70 mins   TOTAL KNEE ARTHROPLASTY Bilateral     Allergies  Allergen Reactions   Cefdinir Diarrhea   Erythromycin Base Diarrhea    Other   Lactose     Diarrhea     Lactose Intolerance (Gi) Diarrhea   Lactulose Diarrhea   Mesalamine Nausea Only   Nitrofuran Derivatives     shakiness   Nitrofurantoin Other (See Comments)    shakiness other   Other Diarrhea and Other (See Comments)    Shaking uncontrollably "lettuce only" "lettuce only"   Sulfa Antibiotics Other (See Comments)    Patient can't recall reaction    Tdap [Tetanus-Diphth-Acell Pertussis]     Shaking uncontrollably   Tetanus Toxoid, Adsorbed Other (See Comments)    Shaking uncontrollable     Immunization History  Administered Date(s) Administered   Influenza Split 03/18/2012, 03/18/2013, 04/14/2014, 04/17/2017   Influenza Whole 04/30/2008, 04/20/2009, 04/18/2011   Influenza, High Dose Seasonal PF 04/23/2015, 03/24/2016, 04/06/2018   Influenza,inj,quad, With Preservative 04/06/2018   Pneumococcal Conjugate-13 11/11/2013   Pneumococcal Polysaccharide-23 05/21/2018   Zoster Recombinat (Shingrix) 05/05/2017    Family History  Problem Relation Age of Onset   Heart disease Mother    Arthritis Mother    Heart attack Father    Diabetes Other        sibling   Heart attack Other        sibling     Current Outpatient Medications:    ALPRAZolam (XANAX) 0.5 MG tablet, Take 0.5 mg by mouth as needed for anxiety. , Disp: , Rfl:    benzonatate (TESSALON) 200 MG capsule, TAKE 1 CAPSULE BY MOUTH 3 TIMES A DAY AS NEEDED FOR COUGH (Patient taking differently: Take 200 mg by mouth 3 (three) times daily as needed for cough. TAKE 1 CAPSULE BY MOUTH 3 TIMES A DAY AS NEEDED FOR COUGH), Disp: 30 capsule, Rfl: 3   cetirizine (ZYRTEC) 10 MG tablet, Take 10 mg by mouth daily., Disp: , Rfl:    clidinium-chlordiazePOXIDE (LIBRAX) 5-2.5 MG capsule, Take 1 capsule by  mouth 2 (two) times daily as needed (for IBS)., Disp: , Rfl:    colestipol (COLESTID) 1 G tablet, Take 1 g by mouth 2 (two) times daily. , Disp: , Rfl:    docusate sodium (COLACE) 100 MG capsule, Take 1 capsule (100 mg total) by mouth 2 (two) times daily., Disp: 10 capsule, Rfl: 0   FLUoxetine (PROZAC) 20 MG capsule, Take 20 mg by mouth daily., Disp: , Rfl:    fluticasone (FLONASE) 50 MCG/ACT nasal spray, Place 2 sprays into the nose daily. , Disp: , Rfl:    Fluticasone-Umeclidin-Vilant (TRELEGY ELLIPTA) 100-62.5-25 MCG/INH AEPB, Inhale 1 puff into the lungs daily., Disp: 2 each, Rfl: 0   furosemide (LASIX) 40 MG tablet, Take 1 tablet (40 mg total) by mouth 2 (two) times daily. (Patient taking differently: Take 40 mg by mouth daily. ), Disp: 180 tablet, Rfl: 1   glycopyrrolate (ROBINUL) 1 MG tablet, Take 1 mg by mouth 3 (three) times daily as needed (for IBS). , Disp: , Rfl:    hydroxychloroquine (  PLAQUENIL) 200 MG tablet, Take 2 tablets (400 mg total) by mouth daily. Stop for 2 weeks, post-op., Disp: , Rfl:    ipratropium (ATROVENT) 0.03 % nasal spray, USE 1 TO 2 SPRAYS INTO BOTH NOSTRILS 2 TIMES DAILY, Disp: 90 mL, Rfl: 1   ipratropium-albuterol (DUONEB) 0.5-2.5 (3) MG/3ML SOLN, Use 1 vial in nebulizer every 6 hours as needed, Disp: 360 mL, Rfl: 5   leflunomide (ARAVA) 20 MG tablet, Take 20 mg by mouth daily. , Disp: , Rfl:    methocarbamol (ROBAXIN) 500 MG tablet, Take 1 tablet (500 mg total) by mouth every 6 (six) hours as needed for muscle spasms., Disp: 40 tablet, Rfl: 0   mirtazapine (REMERON) 45 MG tablet, Take 45 mg by mouth at bedtime., Disp: , Rfl:    montelukast (SINGULAIR) 10 MG tablet, TAKE 1 TABLET BY MOUTH AT  BEDTIME, Disp: 90 tablet, Rfl: 3   Multiple Vitamin (MULTIVITAMIN) tablet, Take 1 tablet by mouth daily., Disp: , Rfl:    omeprazole (PRILOSEC) 40 MG capsule, Take 40 mg by mouth at bedtime. , Disp: , Rfl:    OXYGEN, Inhale 4-6 mLs into the lungs continuous. 4  during the day, 6 at night, Disp: , Rfl:    Pirfenidone (ESBRIET) 801 MG TABS, Take 801 mg by mouth 3 (three) times daily., Disp: 90 tablet, Rfl: 1   potassium chloride SA (K-DUR) 20 MEQ tablet, Take 1 tablet (20 mEq total) by mouth 2 (two) times daily., Disp: 180 tablet, Rfl: 1   PROAIR HFA 108 (90 Base) MCG/ACT inhaler, USE 2 PUFFS BY MOUTH EVERY  6 HOURS AS NEEDED (Patient taking differently: Inhale 2 puffs into the lungs every 6 (six) hours as needed for wheezing or shortness of breath. ), Disp: 25.5 g, Rfl: 3   rosuvastatin (CRESTOR) 20 MG tablet, TAKE 1 TABLET BY MOUTH  DAILY, Disp: 90 tablet, Rfl: 3   verapamil (CALAN-SR) 240 MG CR tablet, Take 1 tablet (240 mg total) by mouth daily., Disp: 90 tablet, Rfl: 1      Objective:   Vitals:   03/15/19 1123  BP: 140/88  Pulse: 82  Temp: 98 F (36.7 C)  SpO2: 100%  Weight: 207 lb (93.9 kg)  Height: 5' 1" (1.549 m)    Estimated body mass index is 39.11 kg/m as calculated from the following:   Height as of this encounter: 5' 1" (1.549 m).   Weight as of this encounter: 207 lb (93.9 kg).  _0 @  Filed Weights   03/15/19 1123  Weight: 207 lb (93.9 kg)     Physical Exam  General Appearance:    Alert, cooperative, no distress, appears stated age - yes , Deconditioned looking - no , OBESE  - yes, Sitting on Wheelchair -  no  Head:    Normocephalic, without obvious abnormality, atraumatic  Eyes:    PERRL, conjunctiva/corneas clear,  Ears:    Normal TM's and external ear canals, both ears  Nose:   Nares normal, septum midline, mucosa normal, no drainage    or sinus tenderness. OXYGEN ON  - yes . Patient is @ 4LN   Throat:   FACE MASK  Neck:   Supple, symmetrical, trachea midline, no adenopathy;    thyroid:  no enlargement/tenderness/nodules; no carotid   bruit or JVD  Back:     Symmetric, no curvature, ROM normal, no CVA tenderness  Lungs:     Distress - no , Wheeze no, Barrell Chest - no, Purse lip breathing - no,  Crackles - YES AT BASE   Chest Wall:    No tenderness or deformity.    Heart:    Regular rate and rhythm, S1 and S2 normal, no rub   or gallop, Murmur - no  Breast Exam:    NOT DONE  Abdomen:     Soft, non-tender, bowel sounds active all four quadrants,    no masses, no organomegaly. Visceral obesity - yes  Genitalia:   NOT DONE  Rectal:   NOT DONE  Extremities:   Extremities - normal, Has Cane - no, Clubbing - no, Edema - no  Pulses:   2+ and symmetric all extremities  Skin:   Stigmata of Connective Tissue Disease - YES OF RA  Lymph nodes:   Cervical, supraclavicular, and axillary nodes normal  Psychiatric:  Neurologic:   Pleasant - yes, Anxious - no, Flat affect - no  CAm-ICU - neg, Alert and Oriented x 3 - yes, Moves all 4s - yes, Speech - normal, Cognition - intact           Assessment:       ICD-10-CM   1. Chronic respiratory failure with hypoxia (HCC)  J96.11   2. ILD (interstitial lung disease) ([place])  J84.9   3. Rheumatoid arthritis, involving unspecified site, unspecified rheumatoid factor presence ([place])  M06.9   4. Therapeutic drug monitoring  Z51.81        Plan:     Patient Instructions     ICD-10-CM   1. Chronic respiratory failure with hypoxia (HCC)  J96.11   2. ILD (interstitial lung disease) ([place])  J84.9   3. Rheumatoid arthritis, involving unspecified site, unspecified rheumatoid factor presence ([place])  M06.9   4. Therapeutic drug monitoring  Z51.81    Glad you are tolerating esbriet well Clinically you appear stable  Plan  - high dose flu shot 03/15/2019 -- continue o2 4 LNC - continue esbriet as before  - do 12 refills on the full dose of 1 pill three times daily - check LFT 03/15/2019 - Take a prescription for emergency electric generator for home  - Also visit for emergency o2 assistance by [employer] [email or web address]/medical-[email or web address]-request - at followup can consider referral to ILD-PRO research  trial  Followup - do spirometry and dlco in 3 months - 3 months or sooner if needed - 30 min visit -Assess need for Trelegy inhaler -in the absence of emphysema or COPD or asthma she does not need it   > 50% of this > 25 min visit spent in face to face counseling or coordination of care - by this undersigned MD - Dr Brand Males. This includes one or more of the following documented above: discussion of test results, diagnostic or treatment recommendations, prognosis, risks and benefits of management options, instructions, education, compliance or risk-factor reduction   SIGNATURE    Dr. Brand Males, M.D., F.C.C.P,  Pulmonary and Critical Care Medicine Staff Physician, [place] Director - Interstitial Lung Disease  Program  [facility] [place] [facility] at [place], [place], [address]  Pager: [phone], If no answer or between  15:00h - 7:00h: call [phone] Telephone: [phone]  12:16 PM 03/15/2019

## 2019-03-15 NOTE — Patient Instructions (Addendum)
ICD-10-CM   1. Chronic respiratory failure with hypoxia (HCC)  J96.11   2. ILD (interstitial lung disease) ([place])  J84.9   3. Rheumatoid arthritis, involving unspecified site, unspecified rheumatoid factor presence ([place])  M06.9   4. Therapeutic drug monitoring  Z51.81    Glad you are tolerating esbriet well Clinically you appear stable  Plan  - high dose flu shot 03/15/2019 -- continue o2 4 LNC - continue esbriet as before  - do 12 refills on the full dose of 1 pill three times daily - check LFT 03/15/2019 - Take a prescription for emergency electric generator for home  - Also visit for emergency o2 assistance by [employer] [email or web address]/medical-[email or web address]-request - at followup can consider referral to ILD-PRO research trial  Followup - do spirometry and dlco in 3 months - 3 months or sooner if needed - 30 min visit -Assess need for Trelegy inhaler -in the absence of emphysema or COPD or asthma she does not need it

## 2019-03-27 ENCOUNTER — Ambulatory Visit: Payer: Medicare Other | Admitting: Pulmonary Disease

## 2019-04-09 ENCOUNTER — Telehealth: Payer: Self-pay

## 2019-04-09 ENCOUNTER — Telehealth: Payer: Self-pay | Admitting: Internal Medicine

## 2019-04-09 DIAGNOSIS — J849 Interstitial pulmonary disease, unspecified: Secondary | ICD-10-CM

## 2019-04-09 MED ORDER — ESBRIET 801 MG PO TABS: 801.0000 mg | ORAL_TABLET | Freq: Three times a day (TID) | ORAL | 11 refills | Status: DC

## 2019-04-09 NOTE — Telephone Encounter (Signed)
error 

## 2019-04-09 NOTE — Telephone Encounter (Signed)
Pt requesting a refill on Esbriet to [facility].  This has been sent to pharmacy as requested.  Nothing further needed at this time- will close encounter.

## 2019-04-17 ENCOUNTER — Telehealth: Payer: Self-pay | Admitting: Pulmonary Disease

## 2019-04-17 DIAGNOSIS — J849 Interstitial pulmonary disease, unspecified: Secondary | ICD-10-CM

## 2019-04-17 NOTE — Telephone Encounter (Signed)
Verbal order given by Ander Purpura, RN to [facility] today.  Provider approval form to be completed Friday 04/19/19 when MR returns.

## 2019-04-17 NOTE — Telephone Encounter (Signed)
[name], you have to fill out a provider approval form for Esbriet for patient because patient now uses [facility] and the medication has never been ordered under you with them. They only had Wyn Quaker on file. I will place fax with nurse working with you on Friday 04/19/19 when you return to office.

## 2019-04-26 MED ORDER — ESBRIET 801 MG PO TABS: 801.0000 mg | ORAL_TABLET | Freq: Three times a day (TID) | ORAL | 11 refills | Status: DC

## 2019-04-26 NOTE — Telephone Encounter (Signed)
Patient called and reported that [facility] said they did not have an Rx for her.  I sent Rx electronically and called [phone] to confirm receipt. Spoke with the pharmacist who stated they needed to verify who was the MD.  Gave Dr. Golden Pop information. The pharmacy should be reaching out to patient within the next 48 hours. Nothing further needed at this time.

## 2019-04-26 NOTE — Addendum Note (Signed)
Addended by: Eileen Stanford on: 04/26/2019 12:33 PM   Modules accepted: Orders

## 2019-04-30 NOTE — Telephone Encounter (Signed)
Paper work placed in Dr. Golden Pop mailbox 04/29/19

## 2019-05-07 ENCOUNTER — Telehealth (HOSPITAL_COMMUNITY): Payer: Self-pay

## 2019-05-07 NOTE — Telephone Encounter (Signed)
Attempted to contact PT at both numbers on file (home & mobile). Not able to reach PT or to leave a v/m. Willa attempt to contact PT at a later time.

## 2019-05-15 ENCOUNTER — Other Ambulatory Visit (HOSPITAL_COMMUNITY)
Admission: RE | Admit: 2019-05-15 | Discharge: 2019-05-15 | Disposition: A | Discharge status: Home / Self Care | Payer: Medicare Other | Source: Ambulatory Visit | Attending: Internal Medicine | Admitting: Internal Medicine

## 2019-05-15 DIAGNOSIS — Z20828 Contact with and (suspected) exposure to other viral communicable diseases: Secondary | ICD-10-CM | POA: Diagnosis not present

## 2019-05-15 DIAGNOSIS — Z01812 Encounter for preprocedural laboratory examination: Secondary | ICD-10-CM | POA: Insufficient documentation

## 2019-05-15 LAB — SARS CORONAVIRUS 2 (TAT 6-24 HRS): SARS Coronavirus 2: NEGATIVE

## 2019-05-17 ENCOUNTER — Ambulatory Visit (INDEPENDENT_AMBULATORY_CARE_PROVIDER_SITE_OTHER): Payer: Medicare Other | Admitting: Internal Medicine

## 2019-05-17 ENCOUNTER — Other Ambulatory Visit: Payer: Self-pay

## 2019-05-17 ENCOUNTER — Encounter: Payer: Self-pay | Admitting: Internal Medicine

## 2019-05-17 VITALS — BP 132/70 | HR 82 | Temp 97.3°F | Ht 61.0 in | Wt 215.0 lb

## 2019-05-17 DIAGNOSIS — J9611 Chronic respiratory failure with hypoxia: Secondary | ICD-10-CM

## 2019-05-17 DIAGNOSIS — M359 Systemic involvement of connective tissue, unspecified: Secondary | ICD-10-CM

## 2019-05-17 DIAGNOSIS — J849 Interstitial pulmonary disease, unspecified: Secondary | ICD-10-CM

## 2019-05-17 DIAGNOSIS — J8489 Other specified interstitial pulmonary diseases: Secondary | ICD-10-CM

## 2019-05-17 DIAGNOSIS — J320 Chronic maxillary sinusitis: Secondary | ICD-10-CM | POA: Diagnosis not present

## 2019-05-17 DIAGNOSIS — Z5181 Encounter for therapeutic drug level monitoring: Secondary | ICD-10-CM

## 2019-05-17 DIAGNOSIS — I2510 Atherosclerotic heart disease of native coronary artery without angina pectoris: Secondary | ICD-10-CM | POA: Diagnosis not present

## 2019-05-17 DIAGNOSIS — M069 Rheumatoid arthritis, unspecified: Secondary | ICD-10-CM

## 2019-05-17 LAB — PULMONARY FUNCTION TEST
DL/VA % pred: 99 %
DL/VA: 4.16 ml/min/mmHg/L
DLCO unc % pred: 65 %
DLCO unc: 11.28 ml/min/mmHg
FEF 25-75 Pre: 1.66 L/sec
FEF2575-%Pred-Pre: 111 %
FEV1-%Pred-Pre: 82 %
FEV1-Pre: 1.51 L
FEV1FVC-%Pred-Pre: 110 %
FEV6-%Pred-Pre: 78 %
FEV6-Pre: 1.82 L
FEV6FVC-%Pred-Pre: 105 %
FVC-%Pred-Pre: 74 %
FVC-Pre: 1.82 L
Pre FEV1/FVC ratio: 83 %
Pre FEV6/FVC Ratio: 100 %

## 2019-05-17 LAB — HEPATIC FUNCTION PANEL
ALT: 21 U/L (ref 0–35)
AST: 20 U/L (ref 0–37)
Albumin: 3.7 g/dL (ref 3.5–5.2)
Alkaline Phosphatase: 79 U/L (ref 39–117)
Bilirubin, Direct: 0.1 mg/dL (ref 0.0–0.3)
Total Bilirubin: 0.3 mg/dL (ref 0.2–1.2)
Total Protein: 6 g/dL (ref 6.0–8.3)

## 2019-05-17 NOTE — Addendum Note (Signed)
Addended by: Suzzanne Cloud E on: 05/17/2019 11:34 AM   Modules accepted: Orders

## 2019-05-17 NOTE — Progress Notes (Signed)
Spiro/DLCO performed today,

## 2019-05-17 NOTE — Patient Instructions (Addendum)
ICD-10-CM   1. Chronic respiratory failure with hypoxia (HCC)  J96.11   2. ILD (interstitial lung disease) ([place])  J84.9   3. Rheumatoid arthritis, involving unspecified site, unspecified rheumatoid factor presence ([place])  M06.9   4. Therapeutic drug monitoring  Z51.81     #ILD  - Glad you are tolerating esbriet well - Clinically you are stable - Glad you got emergency generator  Plan  - check ONO on 4L with CPAP - maybe this is adquieate instead of 6L  - continue esbriet as before - check LFT 05/17/2019  - continue RA treatment through rheumatologist - take ILD-PRO registry consent form- [facility] coordinatory will schedule your first visit after you have read the consent   #Hx of asthma  STable  Plan  - continue trelegy and singulair  #Sinusitis  - suspect yellow sputum and worsening cough is related to sinus issues and winter changes - GEt CT scan sinus without contrast    - based on results refer to DR Wilburn Cornelia  Followup - spirometry and dlco in 3-4 months  - return to ILD clinic in feb  -r eturn sooner if needed

## 2019-05-17 NOTE — Progress Notes (Signed)
05/21/2018- 73 year old female never smoker followed for allergic rhinitis, rhinosinusitis, asthmatic bronchitis, ILD, OSA,complicated by GERD/Crohn's, Rheumatoid arthritis/prednisone -----4 month follow up for asthma and OSA. Per patient she is still having issues with SOB. She has had 2 foot surgeries since her last visit. States that the [name] is working but [name] wanted to switch her to something else. Unsure of the medication name and I did not see it in his last OV.  Singulair, and Anoro, neb duoneb CPAP auto 5-15/Advanced   Download 100% compliance AHI 1.3/hour. I discussed FENO and she will try Symbicort. Discussed inhaled steroids.  We need to get her a new AeroChamber. Has been on sustained antibiotics for failed appliance in her foot. Notices some runny nose and scant bland mucus.  Wheezing has been well controlled.  Little cough.  She and her husband recognize inability to get any exercise as a substantial reason for dyspnea.  No acute events.  Cardiology continues to follow, including her pulmonary hypertension.  We reviewed her most recent chest CT and discussed implications of progressive interstitial fibrosis.  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  I would like to refer her to [name] for his opinion and advice. FENO 03/15/18  49 H CT chest Hi Res 02/02/2018- IMPRESSION: 1. Spectrum of findings compatible with fibrotic interstitial lung disease with a slight basilar predominance, with mild progression since 2017. 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 2. Mild patchy air trapping, unchanged, indicative of small airways disease. 3. Left main and two-vessel coronary atherosclerosis. 4. Stable dilated main pulmonary artery, suggesting chronic pulmonary arterial hypertension. Aortic Atherosclerosis (ICD10-I70.0).  06/08/18-  73 year old female never smoker followed for allergic rhinitis, rhinosinusitis, asthmatic bronchitis, ILD, OSA,complicated by GERD/Crohn's, Rheumatoid arthritis/prednisone -----coughing for 2 weeks, yellow, green mucus, SOB  Singulair, Atrovent nasal spray, Symbicort 160, and Anoro, neb albuterol, neb DuoNeb, Complains of increased cough with chest congestion, green/yellow sputum, low-grade fever 99 degrees over the last 2 weeks.  Not on prednisone now.  Last antibiotic in October. Pending ILD consultation with [name] in December.   07/02/2018  - Visit   73 year old female patient presenting today for acute visit.  Patient was recently treated with a course of Augmentin and reports sputum color is now light green but has decreased in sputum amount since last office visit.  Patient does not feel she is getting better.  Patient remains adherent to Anoro Ellipta.  On arrival to our office today patient was short of breath and oxygen saturations were 81%.  With deep breathing patient was able to increase oxygenation to 85%.  When placed on 2 L O2 patient was able to maintain at 91%.  Patient had previously been on oxygen in 2015 but this was later [facility].  Patient's weight has also increased over the previous office visits, patient remains adherent to diuretic treatment plan as on taking 1 diuretic daily.  Patient reports she is allowed to take an additional diuretic when she feels necessary.  Unfortunately patient has not been weighing herself regularly there is no set plan to when she should take her second diuretic.  Patient is exceptionally sedentary at home and rarely gets out of her bed or lazy boy chair which is about 3 feet from her bed.  Patient's husband takes care of patient completely including getting drinks and food so patient does not have to leave her lazy boy  recliner.  Patient watches many television shows and does not report much activity.   Patient has completed follow-up with  rheumatology last week and no changes to her medications were made.  Patient reports that she was stable at the time.  CPAP compliance report showing 30 out of 30 days used.  All 30 those days greater than 4 hours.  APAP settings 5-15.  Average usage days 7 hours and 56 minutes.  AHI 0.8.    OV 07/31/2018 -referred to interstitial lung disease clinic  Subjective:  Patient ID: [name], female , DOB: 1946/04/21 , age 73 y.o. , MRN: [id number] , ADDRESS: [address] [place] [address]   07/31/2018 -   Chief Complaint  Patient presents with  . Follow-up    [name] that stated to be referred to ILD clinic.  Pt currently has complaints of wheezing which she has had since December 2019, worsening SOB when moving around and also states she has a pain in left side of chest near shoulder. Pt wears between 2-4L O2 but mainly has been wearing 4L at home.      HPI HAYLE PARISI 73 y.o. -accompanied by her husband.  Referred by [name] pulmonologist in our practice for evaluation of interstitial lung disease in the setting of rheumatoid arthritis.  History is gained from her, talking to her husband, review of recent office visits and also the interstitial lung disease questionnaire.  As best as I can gather   [facility] ILD Questionnaire  Symptoms: She is known to have ILD on a CT chest in 2017 but she is not aware of it.  She says that she follows normally for sleep apnea and asthma with [name].  She recollects that approximately 11 or 12 years ago she was hospitalized for pneumonia and was on oxygen for short while not otherwise specified.  Then came off oxygen.  After that overall she is been stable and sedentary using her CPAP.  She is mostly sedentary because of obesity, rheumatoid arthritis and also because of the nonhealing ulcer in her feet for which she is been advised sedentary lifestyle to enable healing.  Most recently to  Thanksgiving 2019 she was seen for respiratory exacerbation and given antibiotics and prednisone.  She followed up mid December 2019 and was found to have new onset hypoxemia 85% on room air.  She was then discharged on portable oxygen which she is using 4 L nasal cannula at rest.  She tells me that at home when she comes off of CPAP she is in her 24s on room air at rest.  In fact in the office today she is 83% on room air at rest but 93% on 2 L nasal cannula at rest.  There is concerned that she has worsening ILD particularly summer 2019 on her CT chest had progressive findings compared to 2 years earlier.  The presumption is that this ILD is caused by rheumatoid arthritis.  In the 2019 CT chest CT is reported as probable UIP but in my personal visualization is either indeterminate or alternative diagnosis.  In terms of symptoms: She reports insidious onset of shortness of breath for some years and gradually getting worse.  Level 2 dyspnea at rest.  Level for dyspnea taking a shower.  Level 5 dyspnea walking at her own pace of walking with others of her age and walking up stairs or walking up a hill.  She does have  a cough with yellow and green sputum.  She does cough at night.  There is also wheezing  Past Medical History : She has longstanding history of rheumatoid arthritis since the 1980s.  Used to be followed by Dr. Danie Binder.  As best as I can gather she used to be on methotrexate maybe 10 or 20 years ago.  She took it for a short while and then stopped it because of diarrhea.  She took this for less than 1 year.  She believes that after that she was basically on pain management.  She is only been on prednisone chronic intake for a total of 1 year approximately 4 to 5 years ago.  Around this time Dr. Hoyt Koch retired and she started seeing Dr. Gavin Pound.  Since then she is been on Arava and Plaquenil.  She has never seen any other immunomodulators in the setting of rheumatoid arthritis  according to history.  Overall the rheumatoid arthritis has caused some disability with deformed joints in her hands and also her using cane.    She is not on any anticoagulation is never had any blood clots.  In 2017 she had vascular extremity duplex venous that was negative for DVT.  Most recently she is been dealing with nonhealing ulcer of the left toe/metatarsal .  She had August and September 2018.  And then 1 in the latter part of 2019.  Most recently she says she has had a duplex lower extremity venous.  I do not have access to these results.  She is waiting on this.  Was done in the last few to several days.   She also has a longstanding history of asthma for which she is on Singulair.  Exam nitric oxide was elevated in August 2019 at 49 ppb but 07/31/2018 I snormal in the 20s though she is wheezing   She has nonobstructive coronary artery disease and in March 2018 had a right heart catheterization with elevated pulmonary pressures but also slightly elevated wedge pressure [see below].  She is started on Lasix  She has sleep apnea for which she uses CPAP.  She has obesity  ROS: Positive for arthralgia and arthritis.  Dry eyes and dry mouth but otherwise negative   FAMILY HISTORY of LUNG DISEASE: Denies   EXPOSURE HISTORY: Never smoked any cigarettes, marijuana, vaping, cocaine or intravenous drug use   HOME and HOBBY DETAILS : [place] lived [place] for 8 years.  No mold.  No dampness.  Does use humidifier and does use CPAP but there is no mold in it.  Also uses nebulizer machine but there is no mold.  No pet birds no musical instruments no guarding habits   OCCUPATIONAL HISTORY (122 questions) : Denies   PULMONARY TOXICITY HISTORY (27 items): She has taken nitrofurantoin 9 years ago.  Has not taken methotrexate for a year 5 years ago and is taken sulfasalazine       Results for VELENCIA, LENART (MRN [id number]) as of 07/31/2018 16:31  Ref. Range  09/03/2015 12:45 02/21/2017  FVC-Pre Latest Units: L 2.18 1.7L  FVC-%Pred-Pre Latest Units: % 84 66%   Results for ELISABETTA, MISHRA (MRN [id number]) as of 07/31/2018 16:31  Ref. Range 09/03/2015 12:45  DLCO cor Latest Units: ml/min/mmHg 14.06  DLCO cor % pred Latest Units: % 69   HEART CATH MARCH 2018 Right Heart Pressures RA (mean): 13 mmHg RV (S/EDP): 38/12 mmHg PA (S/D, mean): 43/23 (32) mmHg PCWP (mean): 16 mmHg  Conclusions: 1. Mild to moderate, non-obstructive coronary artery disease with 50% mid LAD and 20% proximal RCA stenoses. 2. Mild to moderate pulmonary hypertension with mildly elevated right heart filling pressures. 3. Upper normal left heart filling pressures. 4. Normal Fick cardiac output/index. 5. Equalization of end-diastolic pressures with ventricular concordance. This can be seen in the setting of restrictive process.  Recommendations: 1. Escalate statin therapy to prevent progression of CAD. 2. Increase furosemide to 40 mg BID and KCl to 40 mg BID. Patient to have BMP in 1 week to evaluate renal function and potassium. 3. Continue outpatient pulmonary follow-up. I suspect underlying lung disease is the driving force behind the patient's dyspnea, pulmonary hypertension, and elevated right heart pressures. 4. Outpatient follow-up in cardiology clinic in ~6 weeks.  Nelva Bush, MD   CT chest high res July 2019 - visualized personally - personaly opinion - indeterminate or alt dx for UIP but agree with progression  IMPRESSION: Lungs/Pleura: No pneumothorax. No pleural effusion. No acute consolidative airspace disease or lung masses. Few scattered small solid pulmonary nodules in mid to upper right lung, largest 4 mm in the right upper lobe (series 3/image 46), unchanged since 03/14/2016 CT, considered benign. No new significant pulmonary nodules. Mild patchy air trapping in both lungs on the expiration sequence, unchanged. Patchy peribronchovascular and  subpleural reticulation and ground-glass attenuation throughout both lungs with associated mild traction bronchiectasis and mild architectural distortion. There is a slight basilar gradient to these findings. Tiny focus of honeycombing in anterior right upper lobe (series 8/image 59). These findings have mildly worsened since 03/14/2016 chest CT. 1. Spectrum of findings compatible with fibrotic interstitial lung disease with a slight basilar predominance, with mild progression since 2017. 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 2. Mild patchy air trapping, unchanged, indicative of small airways disease. 3. Left main and two-vessel coronary atherosclerosis. 4. Stable dilated main pulmonary artery, suggesting chronic pulmonary arterial hypertension.  Aortic Atherosclerosis (ICD10-I70.0).   Electronically Signed   By: Ilona Sorrel M.D.   On: 02/02/2018 14:03        OV 03/15/2019  Subjective:  Patient ID: [name], female , DOB: November 12, 1945 , age 73 y.o. , MRN: [id number] , ADDRESS: [address] [place] [address]   03/15/2019 -   Chief Complaint  Patient presents with  . IPD (Interstitial pulmonary disease)    Breathing is doing better since being on Esbriet. Has productive cough with green phelgm. Has been on antibiotic for last 6 weeks. Had hip replacement in June.     HPI TUYEN UNCAPHER 73 y.o. 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  Started on Esbriet in March 2020.  Has progressive phenotype.   She presents for routine follow-up with her husband.  She tells me that ever since starting Esbriet in the spring 2020 she has been doing well.  She does not feel that the disease has progressed.  In fact she feels somewhat better.  She continues to use 4 L of oxygen.  She is now taking the Esbriet at 1 pill  3 times daily.  This is the full dose of 1 pill.  Her last pulmonary function test was in early 2020.  It has been a while since she did her liver function test and she requires a repeat of that today.  She is willing to have her flu shot today.  She is  asking for prescription for emergency electric generator.  She does not know about the [employer] energy assistance program.  She has a handicap sticker.  She is interested in the ILD-PRO research registry study.    She is on Trelegy inhaler and Singulair I am not fully clear why   In June 2020 she had hip replacement for stress fracture and was on 6 weeks of Keflex.  She is slowly improving.  She uses a walker.  OV 05/17/2019  Subjective:  Patient ID: [name], female , DOB: [birth year] , age 84 y.o. , MRN: [id number] , AD[address]ESS: [address] [place] [address]   05/17/2019 -   Chief Complaint  Patient presents with  . Follow-up    Patient reports that she has had a productive cough with yellow sputum and her PCP placed her on an antibiotic.    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  Started on Esbriet in March 2020.  Has progressive phenotype. COVID negative 04/25/2019,. On 02 4L [place] since end 2019  Also hx of asthma  Also hx of chronic sinusitis - 2017 CT maxillary sinusits - DR Wilburn Cornelia  History of sleep apnea on CPAP  HPI EMORI MUMME 73 y.o. -presents to the ILD clinic for the above issues.  She continues on pirfenidone since March 2020.  She says this is helping her.  No adverse effects.  Last liver function test was August 2020.  She needs another liver function test right now.  No nausea vomiting or diarrhea or weight loss.  She continues on 4 L of oxygen continuously 24/7.  At night she uses CPAP for her sleep apnea.  She and her husband think that she needs 6 L of oxygen at night and they are trying to increase the oxygen.  Although this is not based on data but on  subjective reasoning.    New issue associated with sinus issues: Her main issue today is that for the last 3-week she has had worsening of her cough and there is associated yellow sputum.  Her COVID-19 test was -2 days ago.  This been going on for 3 weeks.  She tells me this happens to her every winter.  She is complaining of chronic postnasal drainage.  In 2017 she had a CT scan of the sinus that showed maxillary sinusitis and saw Dr. Wilburn Cornelia but since then has not followed up.  She does not have a history of sinus surgery.  She is frustrated by the cough and the yellow sputum.  Her pulmonary function test itself is stable since she started the [place].  She is interested in the ILD pro registry study.  In terms of her asthma: She is on Trelegy and Singulair.  She does not want to stop this.  There is a clinical history.  She says that if she stops this it gets worse.   In terms of her sleep apnea: She continues on CPAP with oxygen at night.    ROS    SYMPTOM SCALE - ILD 03/15/2019  05/17/2019   O2 use x   Shortness of Breath 0 -> 5 scale with 5 being worst (score 6 If unable to do)   At rest 0 0  Simple tasks - showers, clothes change, eating, shaving 0 0  Household (dishes, doing bed, laundry) 5 6  Shopping 5 6  Walking level at own pace 3 2  Walking keeping up with others of same age 28  3  Walking up Stairs 5 3  Walking up [place] 5 6  Total (40 - 48) Dyspnea Score 25 26  How bad is your cough? 2 3  How bad is your fatigue 5 5    Simple office walk 185 feet x  3 laps goal with forehead probe 03/15/2019 - uses 4L [place] at home   O2 used Room air  Number laps completed 1/2 lap ad desat to 88%  Comments about pace   Resting Pulse Ox/HR   Final Pulse Ox/HR   Desaturated </= 88%   Desaturated <= 3% points   Got Tachycardic >/= 90/min   Symptoms at end of test   Miscellaneous comments -walked with 4L [place] - uses 4L [place] at home     Results for CONSETTA, COSNER (MRN [id number]) as of  05/17/2019 10:56  Ref. Range 09/03/2015 12:45 10/01/2018 14:45 05/17/2019 08:59  FVC-Pre Latest Units: L 2.18 1.81 1.82  FVC-%Pred-Pre Latest Units: % 84 72 74  Results for OMER, MONTER (MRN [id number]) as of 05/17/2019 10:56  Ref. Range 09/03/2015 12:45 10/01/2018 14:45 05/17/2019 08:59  DLCO unc Latest Units: ml/min/mmHg 14.82 11.94 11.28  DLCO unc % pred Latest Units: % 73 69 65    IMPRESSION: CT chest Jan  2020 1. Severe tracheobronchomalacia, more pronounced on today's scan. 2. Interval progression of basilar predominant fibrotic interstitial lung disease with particularly increased ground-glass component. No honeycombing. 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 Findings are consistent with UIP per consensus guidelines: Diagnosis of Idiopathic Pulmonary Fibrosis: An Official ATS/ERS/JRS/ALAT Clinical Practice Guideline. [place], Iss 5, [phone], Mar 18 2017. 3. Stable dilated main pulmonary artery, suggesting chronic pulmonary arterial hypertension. 4. Left main and 3 vessel coronary atherosclerosis.  Aortic Atherosclerosis (ICD10-I70.0).   Electronically Signed   By: Ilona Sorrel M.D.   On: 08/20/2018 08:21   - per HPI     has a past medical history of Acute asthmatic bronchitis, Allergic rhinitis, Anxiety, Arthritis, Esophageal reflux, Hypertension, Interstitial lung disease ([place]) (dx jan 2020), Irritable bowel syndrome, Oxygen dependent, PONV (postoperative nausea and vomiting), Rheumatoid arthritis(714.0), and Sleep apnea.   reports that she has never smoked. She has never used smokeless tobacco.  Past Surgical History:  Procedure Laterality Date  .  c secttion  1977  . 2 foot fusions Left    total of 6 left foot sx  . ABDOMINAL HYSTERECTOMY     complete   . ANKLE FUSION  2009   left  . BACK SURGERY     lower l to l 5 fused  . CHOLECYSTECTOMY    .  RIGHT/LEFT HEART CATH AND CORONARY ANGIOGRAPHY N/A 09/16/2016   Procedure: Right/Left Heart Cath and Coronary Angiography;  Surgeon: Nelva Bush, MD;  Location: [facility];  Service: Cardiovascular;  Laterality: N/A;  . TONSILLECTOMY    . TOTAL HIP ARTHROPLASTY Right 12/20/2018   Procedure: TOTAL HIP ARTHROPLASTY ANTERIOR APPROACH;  Surgeon: Paralee Cancel, MD;  Location: [facility];  Service: Orthopedics;  Laterality: Right;  70 mins  . TOTAL KNEE ARTHROPLASTY Bilateral     Allergies  Allergen Reactions  . Cefdinir Diarrhea  . Erythromycin Base Diarrhea    Other  . Lactose     Diarrhea    . Lactose Intolerance (Gi) Diarrhea  . Lactulose Diarrhea  . Mesalamine Nausea Only  . Nitrofuran Derivatives     shakiness  .  Nitrofurantoin Other (See Comments)    shakiness other  . Other Diarrhea and Other (See Comments)    Shaking uncontrollably "lettuce only" "lettuce only"  . Sulfa Antibiotics Other (See Comments)    Patient can't recall reaction   . Tdap [Tetanus-Diphth-Acell Pertussis]     Shaking uncontrollably  . Tetanus Toxoid, Adsorbed Other (See Comments)    Shaking uncontrollable     Immunization History  Administered Date(s) Administered  . Fluad Quad(high Dose 65+) 03/15/2019  . Influenza Split 03/18/2012, 03/18/2013, 04/14/2014, 04/17/2017  . Influenza Whole 04/30/2008, 04/20/2009, 04/18/2011  . Influenza, High Dose Seasonal PF 04/23/2015, 03/24/2016, 04/06/2018  . Influenza,inj,quad, With Preservative 04/06/2018  . Pneumococcal Conjugate-13 11/11/2013  . Pneumococcal Polysaccharide-23 05/21/2018  . Zoster Recombinat (Shingrix) 05/05/2017    Family History  Problem Relation Age of Onset  . Heart disease Mother   . Arthritis Mother   . Heart attack Father   . Diabetes Other        sibling  . Heart attack Other        sibling     Current Outpatient Medications:  .  amoxicillin (AMOXIL) 250 MG capsule, Take 250 mg by mouth 3 (three) times daily., Disp:  , Rfl:  .  benzonatate (TESSALON) 200 MG capsule, TAKE 1 CAPSULE BY MOUTH 3 TIMES A DAY AS NEEDED FOR COUGH (Patient taking differently: Take 200 mg by mouth 3 (three) times daily as needed for cough. TAKE 1 CAPSULE BY MOUTH 3 TIMES A DAY AS NEEDED FOR COUGH), Disp: 30 capsule, Rfl: 3 .  cetirizine (ZYRTEC) 10 MG tablet, Take 10 mg by mouth daily., Disp: , Rfl:  .  clidinium-chlordiazePOXIDE (LIBRAX) 5-2.5 MG capsule, Take 1 capsule by mouth 2 (two) times daily as needed (for IBS)., Disp: , Rfl:  .  colestipol (COLESTID) 1 G tablet, Take 1 g by mouth 2 (two) times daily. , Disp: , Rfl:  .  docusate sodium (COLACE) 100 MG capsule, Take 1 capsule (100 mg total) by mouth 2 (two) times daily., Disp: 10 capsule, Rfl: 0 .  FLUoxetine (PROZAC) 20 MG capsule, Take 20 mg by mouth daily., Disp: , Rfl:  .  fluticasone (FLONASE) 50 MCG/ACT nasal spray, Place 2 sprays into the nose daily. , Disp: , Rfl:  .  Fluticasone-Umeclidin-Vilant (TRELEGY ELLIPTA) 100-62.5-25 MCG/INH AEPB, Inhale 1 puff into the lungs daily., Disp: 2 each, Rfl: 0 .  furosemide (LASIX) 40 MG tablet, Take 1 tablet (40 mg total) by mouth 2 (two) times daily. (Patient taking differently: Take 40 mg by mouth daily. ), Disp: 180 tablet, Rfl: 1 .  glycopyrrolate (ROBINUL) 1 MG tablet, Take 1 mg by mouth 3 (three) times daily as needed (for IBS). , Disp: , Rfl:  .  hydroxychloroquine (PLAQUENIL) 200 MG tablet, Take 2 tablets (400 mg total) by mouth daily. Stop for 2 weeks, post-op., Disp: , Rfl:  .  ipratropium (ATROVENT) 0.03 % nasal spray, USE 1 TO 2 SPRAYS INTO BOTH NOSTRILS 2 TIMES DAILY, Disp: 90 mL, Rfl: 1 .  ipratropium-albuterol (DUONEB) 0.5-2.5 (3) MG/3ML SOLN, Use 1 vial in nebulizer every 6 hours as needed, Disp: 360 mL, Rfl: 5 .  leflunomide (ARAVA) 20 MG tablet, Take 20 mg by mouth daily. , Disp: , Rfl:  .  methocarbamol (ROBAXIN) 500 MG tablet, Take 1 tablet (500 mg total) by mouth every 6 (six) hours as needed for muscle spasms.,  Disp: 40 tablet, Rfl: 0 .  mirtazapine (REMERON) 45 MG tablet, Take 45 mg by mouth  at bedtime., Disp: , Rfl:  .  montelukast (SINGULAIR) 10 MG tablet, TAKE 1 TABLET BY MOUTH AT  BEDTIME, Disp: 90 tablet, Rfl: 3 .  Multiple Vitamin (MULTIVITAMIN) tablet, Take 1 tablet by mouth daily., Disp: , Rfl:  .  omeprazole (PRILOSEC) 40 MG capsule, Take 40 mg by mouth at bedtime. , Disp: , Rfl:  .  OXYGEN, Inhale 4-6 mLs into the lungs continuous. 4 during the day, 6 at night, Disp: , Rfl:  .  Pirfenidone (ESBRIET) 801 MG TABS, Take 801 mg by mouth 3 (three) times daily., Disp: 90 tablet, Rfl: 11 .  potassium chloride SA (K-DUR) 20 MEQ tablet, Take 1 tablet (20 mEq total) by mouth 2 (two) times daily., Disp: 180 tablet, Rfl: 1 .  PROAIR HFA 108 (90 Base) MCG/ACT inhaler, USE 2 PUFFS BY MOUTH EVERY  6 HOURS AS NEEDED (Patient taking differently: Inhale 2 puffs into the lungs every 6 (six) hours as needed for wheezing or shortness of breath. ), Disp: 25.5 g, Rfl: 3 .  rosuvastatin (CRESTOR) 20 MG tablet, TAKE 1 TABLET BY MOUTH  DAILY, Disp: 90 tablet, Rfl: 3 .  verapamil (CALAN-SR) 240 MG CR tablet, Take 1 tablet (240 mg total) by mouth daily., Disp: 90 tablet, Rfl: 1 .  ALPRAZolam (XANAX) 0.5 MG tablet, Take 0.5 mg by mouth as needed for anxiety. , Disp: , Rfl:       Objective:   Vitals:   05/17/19 1043  BP: 132/70  Pulse: 82  Temp: (!) 97.3 F (36.3 C)  TempSrc: Temporal  SpO2: 97%  Weight: 215 lb (97.5 kg)  Height: _0  (1.549 m)    Estimated body mass index is 40.62 kg/m as calculated from the following:   Height as of this encounter: _1  (1.549 m).   Weight as of this encounter: 215 lb (97.5 kg).  _2 @  [employer]   05/17/19 1043  Weight: 215 lb (97.5 kg)     Physical Exam  General Appearance:    Alert, cooperative, no distress, appears stated age - yes , Deconditioned looking - ye , OBESE  - yes, Sitting on Wheelchair -  No WAKER - YES  Head:    Normocephalic,  without obvious abnormality, atraumatic  Eyes:    PERRL, conjunctiva/corneas clear,  Ears:    Normal TM's and external ear canals, both ears  Nose:   Nares normal, septum midline, mucosa normal, no drainage    or sinus tenderness. OXYGEN ON  - yes . Patient is @ 4L N   Throat:   Lips, mucosa, and tongue normal; teeth and gums normal. Cyanosis on lips - NO. POST NASAL DRIP +  Neck:   Supple, symmetrical, trachea midline, no adenopathy;    thyroid:  no enlargement/tenderness/nodules; no carotid   bruit or JVD  Back:     Symmetric, no curvature, ROM normal, no CVA tenderness  Lungs:     Distress - no , Wheeze no, Barrell Chest - no, Purse lip breathing - no, Crackles - faint   Chest Wall:    No tenderness or deformity.    Heart:    Regular rate and rhythm, S1 and S2 normal, no rub   or gallop, Murmur - no  Breast Exam:    NOT DONE  Abdomen:     Soft, non-tender, bowel sounds active all four quadrants,    no masses, no organomegaly. Visceral obesity - yes  Genitalia:   NOT DONE  Rectal:   NOT DONE  Extremities:   Extremities - normal, Has Cane - no, Clubbing - no, Edema - no  Pulses:   2+ and symmetric all extremities  Skin:   Stigmata of Connective Tissue Disease - YES - RA FINGERS +  Lymph nodes:   Cervical, supraclavicular, and axillary nodes normal  Psychiatric:  Neurologic:   Pleasant - yes, Anxious - yes mild, Flat affect - no  CAm-ICU - neg, Alert and Oriented x 3 - yes, Moves all 4s - yes, Speech - normal, Cognition - intact           Assessment:       ICD-10-CM   1. Interstitial lung disease due to connective tissue disease (HCC)  J84.89 Pulse oximetry, overnight   M35.9 Hepatic function panel    Pulmonary Function Test  2. Chronic respiratory failure with hypoxia (HCC)  J96.11   3. Therapeutic drug monitoring  Z51.81   4. Chronic maxillary sinusitis  J32.0 CT Maxillofacial LTD WO CM       Plan:     Patient Instructions     ICD-10-CM   1. Chronic respiratory  failure with hypoxia (HCC)  J96.11   2. ILD (interstitial lung disease) ([place])  J84.9   3. Rheumatoid arthritis, involving unspecified site, unspecified rheumatoid factor presence ([place])  M06.9   4. Therapeutic drug monitoring  Z51.81     #ILD  - Glad you are tolerating esbriet well - Clinically you are stable - Glad you got emergency generator  Plan  - check ONO on 4L with CPAP - maybe this is adquieate instead of 6L  - continue esbriet as before - check LFT 05/17/2019  - continue RA treatment through rheumatologist - take ILD-PRO registry consent form- [facility] coordinatory will schedule your first visit after you have read the consent   #Hx of asthma  STable  Plan  - continue trelegy and singulair  #Sinusitis  - suspect yellow sputum and worsening cough is related to sinus issues and winter changes - GEt CT scan sinus without contrast    - based on results refer to DR Wilburn Cornelia  Followup - spirometry and dlco in 3-4 months  - return to ILD clinic in feb  -r eturn sooner if needed   > 50% of this > 25 min visit spent in face to face counseling or coordination of care - by this undersigned MD - Dr Brand Males. This includes one or more of the following documented above: discussion of test results, diagnostic or treatment recommendations, prognosis, risks and benefits of management options, instructions, education, compliance or risk-factor reduction   SIGNATURE    Dr. Brand Males, M.D., F.C.C.P,  Pulmonary and Critical Care Medicine Staff Physician, [place] Director - Interstitial Lung Disease  Program  [facility] [place], [place], [address]  Pager: [phone], If no answer or between  15:00h - 7:00h: call [phone] Telephone: [phone]  11:31 AM 05/17/2019

## 2019-05-21 ENCOUNTER — Encounter: Payer: Self-pay | Admitting: Internal Medicine

## 2019-05-23 ENCOUNTER — Other Ambulatory Visit: Payer: Self-pay

## 2019-05-23 ENCOUNTER — Ambulatory Visit
Admission: RE | Admit: 2019-05-23 | Discharge: 2019-05-23 | Disposition: A | Discharge status: Home / Self Care | Payer: Medicare Other | Source: Ambulatory Visit | Attending: Internal Medicine | Admitting: Internal Medicine

## 2019-05-23 DIAGNOSIS — J320 Chronic maxillary sinusitis: Secondary | ICD-10-CM

## 2019-05-27 ENCOUNTER — Other Ambulatory Visit: Payer: Self-pay | Admitting: Internal Medicine

## 2019-05-27 DIAGNOSIS — I2510 Atherosclerotic heart disease of native coronary artery without angina pectoris: Secondary | ICD-10-CM

## 2019-05-30 ENCOUNTER — Other Ambulatory Visit: Payer: Self-pay | Admitting: Internal Medicine

## 2019-05-30 ENCOUNTER — Other Ambulatory Visit: Payer: Self-pay | Admitting: Pulmonary Disease

## 2019-05-30 DIAGNOSIS — I2510 Atherosclerotic heart disease of native coronary artery without angina pectoris: Secondary | ICD-10-CM

## 2019-06-05 ENCOUNTER — Encounter: Payer: Medicare Other | Admitting: Internal Medicine

## 2019-06-05 DIAGNOSIS — J849 Interstitial pulmonary disease, unspecified: Secondary | ICD-10-CM

## 2019-06-05 NOTE — Research (Signed)
Title: Chronic Fibrosing Interstitial Lung Disease with Progressive Phenotype Prospective Outcomes (ILD-PRO) Registry (ClinicalTrials.gov identifier [id number])  Protocol for  06/05/2019  is Version [id number] Consent for  06/05/2019  is Version [id number]  Synopsis: To collect data & biological samples to support future research in chronic fibrosing ILDs. Registry will describe current approaches to diagnosis & treatment of chronic fibrosing ILDs, analyze participant characteristics to describe the history of disease, assess QOL from self-administered questionnaires, describe participant interactions with health care systems, describe other ILD treatment practices across institutions, and utilize biological samples linked to well characterized other ILD participants to ID disease biomarkers.                                                                                     Key Inclusion Criteria:  -Age >/= 23 Years  -Diagnosis of non-IPF ILD of any duration   -Chronic fibrosing ILD defined by reticular abnormality with traction bronchiectasis with/out honeycombing confirmed by chest HRCT scan and/or LB.  -Progressive phenotype defined by fulfilling at least ONE  of criteria below within last 12 months  ?10%  decline in FVC % pred   ?5 - <10% decline in FVC % pred + i worsening of respiratory symptoms   ?5 - <10% decline in FVC % pred + increasing extent of fibrotic changes on HRCT   worsening of respiratory symptoms +  increase of fibrotic changes on HRCT independent of FVC change.   Key Exclusion Criteria:  -Malignancy, other than skin or early stage prostate cancer, </= 5 years  -listed for lung transplantation at the time of enrollment  - enrolled in other  clinical trial   Clinical Research officer, political party / Research RN note : This visit for [name] with DOB: 04/29/1946 on 06/05/2019 for the above protocol is Visit/Encounter # Enrollment/Baseline and is for  purpose of research. The consent for this encounter is under Protocol Version Amendment 4 and is currently IRB approved. Subject thanked for participation in research and contribution to science.   In this visit on 06/05/2019, the subject met with me in the office to discuss the protocol ICF and sign. The study coordinator went over the entire ICF with the subject and the subject was given ample time to read and review the ICF. After review of the ICF, all questions were answered to the subject's satisfaction. Clemetine Marker reviewed the ICF with the patient, explained the purpose of research, and asked if there were any additional questions. She stated that the study coordinator and doctor have explained the study to her thoroughly and stated she had no additional questions. The subject, study coordinator, and PI signed the ICF and the subject was given a signed copy of the ICF for her records. After consent all study related procedures were conducted as per the above stated protocol. For additional information on today' s visit, please refer to subject's paper source binder.   Signed by  Elizabeth Assistant [facility]  [place], [place] 3:18 PM 06/05/2019

## 2019-06-06 ENCOUNTER — Other Ambulatory Visit: Payer: Self-pay

## 2019-06-06 ENCOUNTER — Ambulatory Visit
Admission: RE | Admit: 2019-06-06 | Discharge: 2019-06-06 | Disposition: A | Discharge status: Home / Self Care | Payer: Medicare Other | Source: Ambulatory Visit | Attending: Internal Medicine | Admitting: Internal Medicine

## 2019-06-06 ENCOUNTER — Other Ambulatory Visit: Payer: Self-pay | Admitting: Internal Medicine

## 2019-06-06 ENCOUNTER — Encounter: Payer: Self-pay | Admitting: Internal Medicine

## 2019-06-06 DIAGNOSIS — Z1231 Encounter for screening mammogram for malignant neoplasm of breast: Secondary | ICD-10-CM

## 2019-06-10 ENCOUNTER — Telehealth: Payer: Self-pay | Admitting: Internal Medicine

## 2019-06-10 NOTE — Telephone Encounter (Signed)
Called and spoke with pt letting her know the results of the ono that she should continue on 4L. Pt verbalized understanding.  While on the phone with pt, pt asked about the results of hte sinus ct she had done. MR, please advise on this for pt. Thanks!

## 2019-06-10 NOTE — Telephone Encounter (Signed)
ONO - </= 88% fir 3 min 28 sec. On O2 05/21/2019 -> on 4L  -> no need to increase o2 at night

## 2019-06-11 NOTE — Telephone Encounter (Signed)
IMPRESSION: 1. No acute or chronic sinus disease. 2. No acute bony findings.   Electronically Signed   By: Marijo Sanes M.D.   On: 05/23/2019 15:48

## 2019-06-11 NOTE — Telephone Encounter (Signed)
Called the patient and made her aware of the results. She voiced understanding, nothing further needed at this time.

## 2019-06-13 IMAGING — CR DG CHEST 2V
2 series · 2 of 2 positions shown · non-contrast
Comparison: Radiographs July 28, 2016.

CLINICAL DATA: Preop for left tibial rod insertion.

EXAM:
CHEST  2 VIEW

[w chest pa]
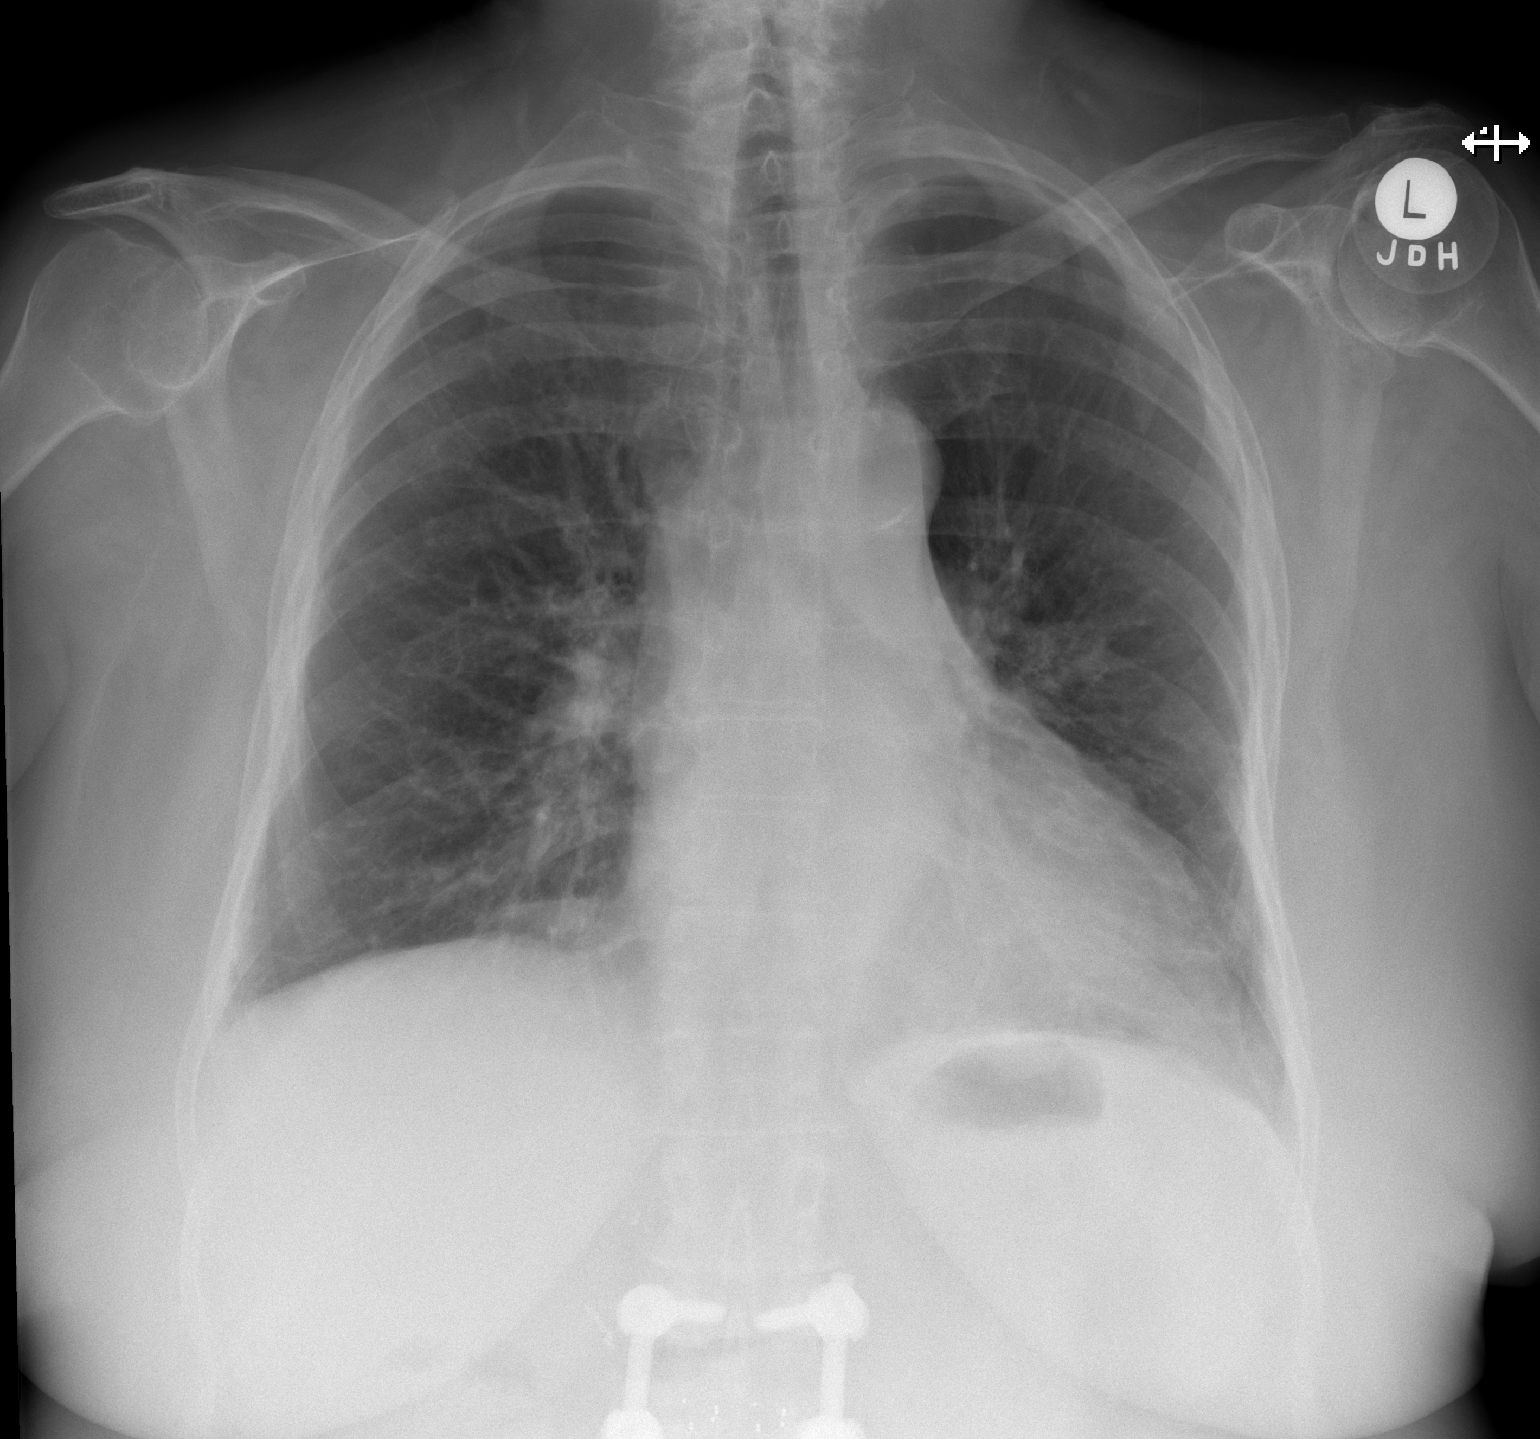

[w chest lat]
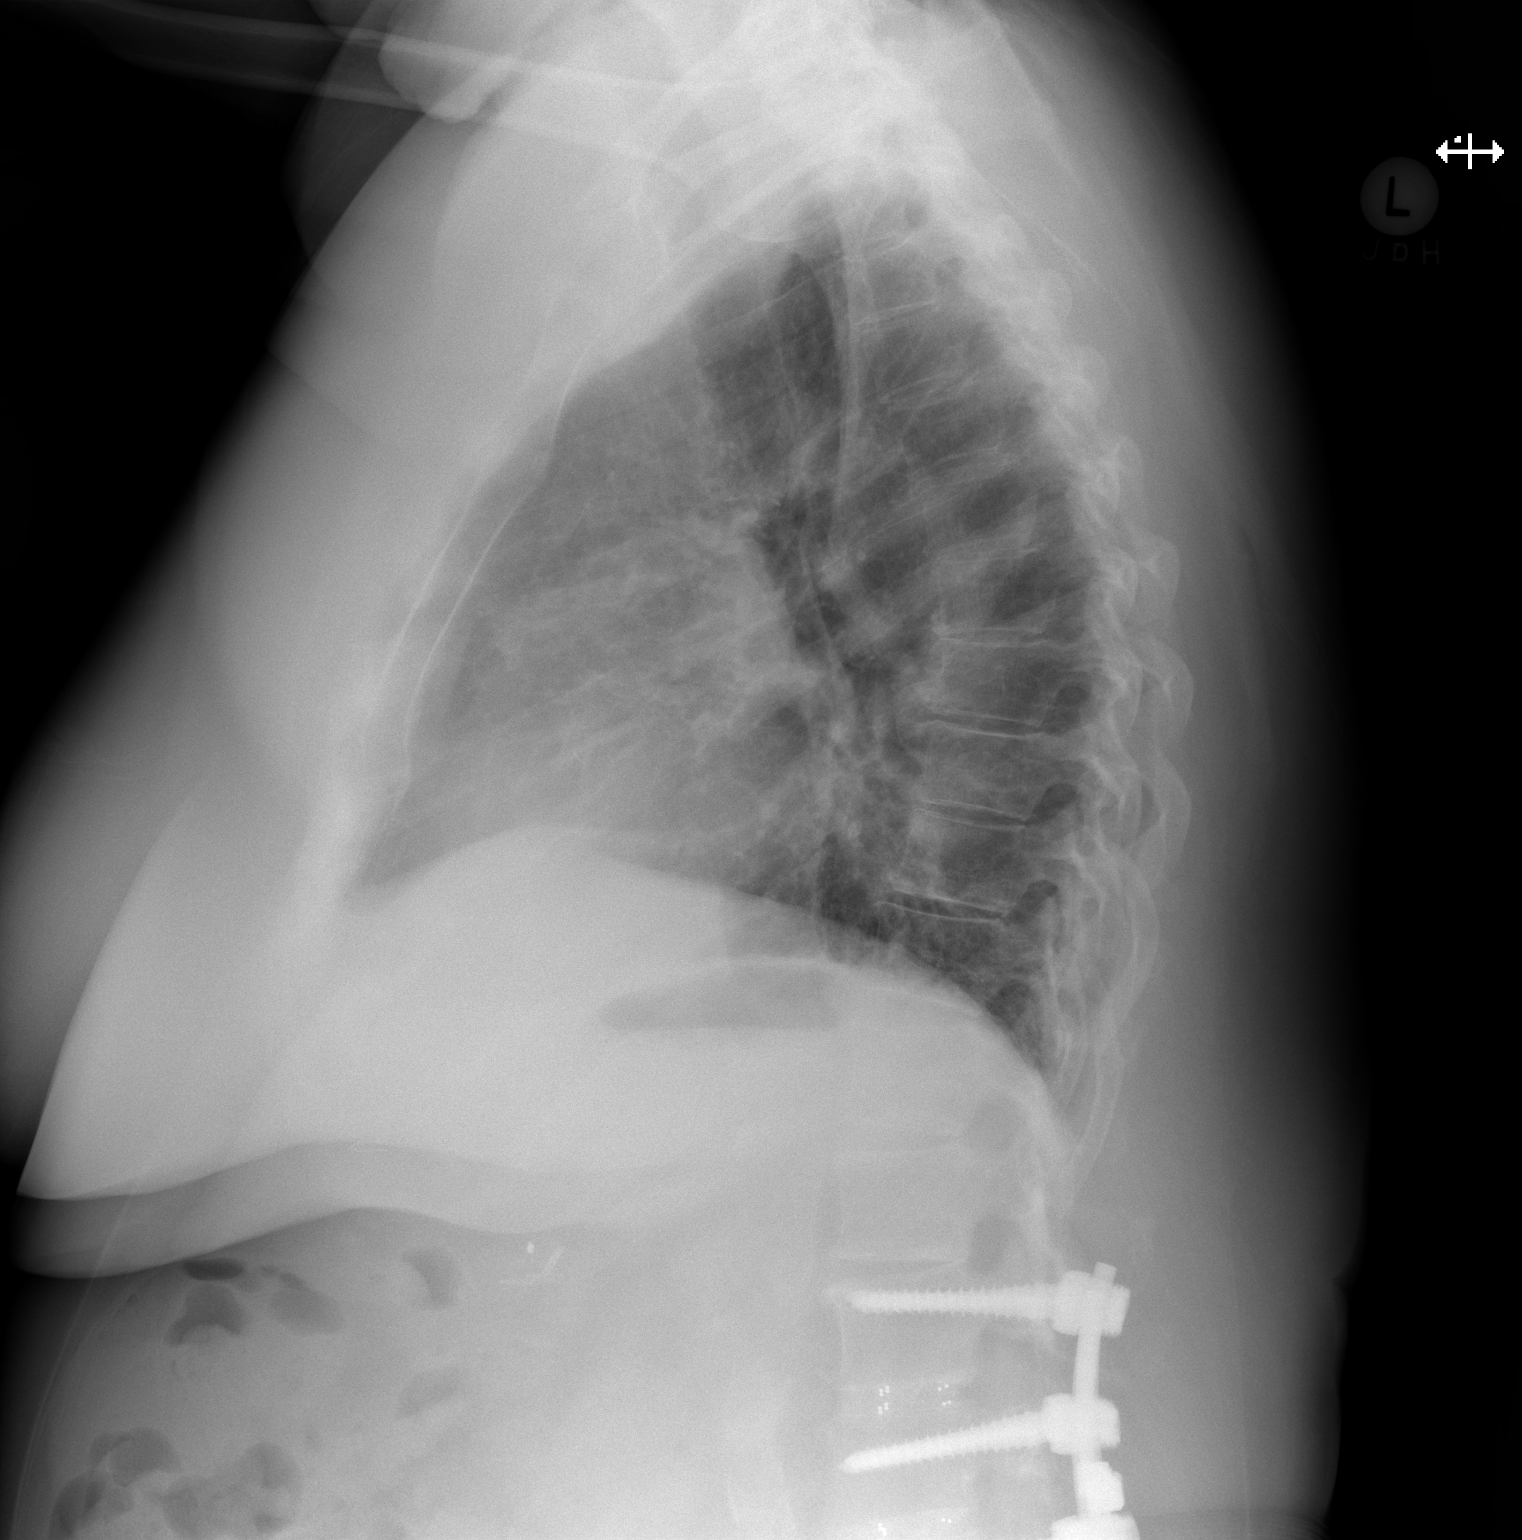

[2 of 2 positions shown; findings below may reference images not displayed]

FINDINGS: Stable cardiomediastinal silhouette. Atherosclerosis of thoracic
aorta is noted. No pneumothorax or pleural effusion is noted. No
acute pulmonary disease is noted. Bony thorax is unremarkable.
IMPRESSION: No active cardiopulmonary disease.  Aortic atherosclerosis.

## 2019-07-03 ENCOUNTER — Ambulatory Visit: Payer: Medicare Other | Admitting: Internal Medicine

## 2019-07-23 ENCOUNTER — Telehealth: Payer: Self-pay | Admitting: Internal Medicine

## 2019-07-23 NOTE — Telephone Encounter (Signed)
Please advise if you would like patient to come in for 2/23 visit since there are no PFT appt.s till March or wait until after PFT.

## 2019-07-24 NOTE — Telephone Encounter (Signed)
She needs to come in feb 2021 without the pft . (OR) you can make a phone visit now in jan/early feb with me andI can see her I March with spiro/dlco

## 2019-07-24 NOTE — Telephone Encounter (Signed)
We will leave pt's OV with MR as is but go ahead and get PFT scheduled to be done first available so that way she is scheduled for one.

## 2019-07-25 ENCOUNTER — Encounter: Payer: Self-pay | Admitting: Internal Medicine

## 2019-07-25 ENCOUNTER — Other Ambulatory Visit: Payer: Self-pay

## 2019-07-25 ENCOUNTER — Ambulatory Visit (INDEPENDENT_AMBULATORY_CARE_PROVIDER_SITE_OTHER): Payer: Medicare Other | Admitting: Internal Medicine

## 2019-07-25 VITALS — BP 136/80 | HR 91 | Ht 61.0 in | Wt 198.0 lb

## 2019-07-25 DIAGNOSIS — J849 Interstitial pulmonary disease, unspecified: Secondary | ICD-10-CM | POA: Diagnosis not present

## 2019-07-25 DIAGNOSIS — I2510 Atherosclerotic heart disease of native coronary artery without angina pectoris: Secondary | ICD-10-CM | POA: Diagnosis not present

## 2019-07-25 DIAGNOSIS — J9611 Chronic respiratory failure with hypoxia: Secondary | ICD-10-CM | POA: Diagnosis not present

## 2019-07-25 DIAGNOSIS — I5032 Chronic diastolic (congestive) heart failure: Secondary | ICD-10-CM

## 2019-07-25 DIAGNOSIS — E785 Hyperlipidemia, unspecified: Secondary | ICD-10-CM

## 2019-07-25 MED ORDER — POTASSIUM CHLORIDE CRYS ER 20 MEQ PO TBCR: 20.0000 meq | EXTENDED_RELEASE_TABLET | Freq: Two times a day (BID) | ORAL | 3 refills | Status: DC

## 2019-07-25 MED ORDER — ROSUVASTATIN CALCIUM 20 MG PO TABS: 20.0000 mg | ORAL_TABLET | Freq: Every day | ORAL | 3 refills | Status: DC

## 2019-07-25 MED ORDER — VERAPAMIL HCL ER 240 MG PO TBCR: 240.0000 mg | EXTENDED_RELEASE_TABLET | Freq: Every day | ORAL | 3 refills | Status: DC

## 2019-07-25 MED ORDER — FUROSEMIDE 40 MG PO TABS: 40.0000 mg | ORAL_TABLET | Freq: Every day | ORAL | 3 refills | Status: DC

## 2019-07-25 NOTE — Patient Instructions (Signed)
Medication Instructions:  Your physician recommends that you continue on your current medications as directed. Please refer to the Current Medication list given to you today.  *If you need a refill on your cardiac medications before your next appointment, please call your pharmacy*  Lab Work: Worthy Keeler will be requested from your primary care physician.  If you have labs (blood work) drawn today and your tests are completely normal, you will receive your results only by: Marland Kitchen MyChart Message (if you have MyChart) OR . A paper copy in the mail If you have any lab test that is abnormal or we need to change your treatment, we will call you to review the results.  Testing/Procedures: none  Follow-Up: At [facility], you and your health needs are our priority.  As part of our continuing mission to provide you with exceptional heart care, we have created designated Provider Care Teams.  These Care Teams include your primary Cardiologist (physician) and Advanced Practice Providers (APPs -  Physician Assistants and Nurse Practitioners) who all work together to provide you with the care you need, when you need it.  Your next appointment:   6 month(s)  The format for your next appointment:   Virtual Visit   Provider:    You may see Nelva Bush, MD or one of the following Advanced Practice Providers on your designated Care Team:    Murray Hodgkins, NP  Christell Faith, PA-C  Marrianne Mood, PA-C

## 2019-07-25 NOTE — Progress Notes (Signed)
Follow-up Outpatient Visit Date: 07/25/2019  Primary Care Provider: Lorene Dy, MD [address] [place] [address]  Chief Complaint: Follow-up shortness of breath  HPI:  [name] is a 74 y.o. female with history of nonobstructive coronary artery disease, asthmatic bronchitis, interstitial lung disease, GERD, rheumatoid arthritis, CVA, prediabetes, IBS, and allergic rhinitis, who presents for follow-up of shortness of breath.  We last spoke via virtual visit in August, at which time [name] reported feeling considerably better with continued use of Esbriet for treatment of her ILD.  Today, [name] reports that her breathing is about the same.  She continues to have dyspnea with exertion and is using supplemental oxygen around-the-clock (typically 4 L).  She is frustrated by her limited mobility, largely due to being tethered to supplemental oxygen.  She is tolerating Esbriet well and is scheduled for follow-up with [name] at the [facility] of February.  She denies chest pain, palpitations, and lightheadedness, though she reports occasional tingling all just below the left clavicle.  This is not exertional and comes on randomly, sometimes lasting a few days at a time.  She also began having pain about the right elbow 2 to 3 days ago, which has improved somewhat with application of heat.  She also notes occasional numbness in her hands, especially when she wakes up in the morning.  She denies significant lower extremity swelling, remaining on furosemide 40 mg daily.  Wounds overlying hip repair have healed since we last spoke.  --------------------------------------------------------------------------------------------------  Cardiovascular History & Procedures: Cardiovascular Problems:  Shortness of breath  Non-obstructive coronary artery disease  Stroke  Risk Factors:  History of stroke, borderline diabetes, hyperlipidemia, obesity, and chronic steroid  use  Cath/PCI:  L/RHC (09/16/16): LMCA normal. LAD with 50% midvessel stenosis. LCx normal. 20% proximal RCA. LVEDP 15. RA 13, RV 38/12, PA 43/23 (32), PCWP 16. Ventricular concordance noted. Fick CO/CI 6.4/3.7. PA sat 70%, RA sat 75%, Ao sat 94%. PVR 2.5 Wood Units.  CV Surgery:  None  EP Procedures and Devices:  None  Non-Invasive Evaluation(s):  ABIs (07/30/2018): Normal ABIs bilaterally.  Abnormal TBI's bilaterally.  Carotid Doppler (10/26/16):Heterogeneous plaque, bilaterally. Tortuous mid and distal ICA's, bilaterally. 1-39% bilateral ICA stenosis. Normal subclavian arteries, bilaterally. Patent vertebral arteries with antegrade flow.Subcentimeter hypoechoic nodule in the thyroid.  Transthoracic echocardiogram (05/11/16): Normal LV size and in traction (EF 55-60%). Normal LV wall motion. Grade 1 diastolic. Normal RV size and function. Mild mitral valve thickening. Mild TR.  Pharmacologic myocardial perfusion stress test (05/11/16): Low risk study without ischemia or scar. LVEF 62%.  Bilateral lower extremity venous duplex (04/07/16): No evidence of DVT though left peroneal vein was suboptimally imaged.  Transthoracic echocardiogram (12/17/12): Normal LV size with abnormal septal motion and EF of 50%. Grade 1 diastolic dysfunction noted. Aortic sclerosis without stenosis or regurgitation evident. Trivial MR and TR without other significant valvular disease. Normal PA systolic and central venous pressures.  Bilateral lower extremity arterial duplex (12/05/12): Bilateral ABIs 1.2. No significant stenosis identified.  Recent CV Pertinent Labs: Lab Results  Component Value Date   CHOL 127 01/09/2017   HDL 53 01/09/2017   LDLCALC 38 01/09/2017   LDLDIRECT 53 01/09/2017   TRIG 181 (H) 01/09/2017   CHOLHDL 2.4 01/09/2017   INR 1.0 12/17/2018   K 4.2 02/13/2019   BUN 15 02/13/2019   BUN 18 01/09/2017   CREATININE 0.73 02/13/2019   CREATININE 0.91 05/30/2016    Past medical  and surgical history were  reviewed and updated in EPIC.  Current Meds  Medication Sig  . albuterol (VENTOLIN HFA) 108 (90 Base) MCG/ACT inhaler USE 2 PUFFS BY MOUTH EVERY  6 HOURS AS NEEDED  . ALPRAZolam (XANAX) 0.5 MG tablet Take 0.5 mg by mouth as needed for anxiety.   Marland Kitchen aspirin EC 81 MG tablet Take 81 mg by mouth daily.  . benzonatate (TESSALON) 200 MG capsule TAKE 1 CAPSULE BY MOUTH 3 TIMES A DAY AS NEEDED FOR COUGH (Patient taking differently: Take 200 mg by mouth 3 (three) times daily as needed for cough. TAKE 1 CAPSULE BY MOUTH 3 TIMES A DAY AS NEEDED FOR COUGH)  . cetirizine (ZYRTEC) 10 MG tablet Take 10 mg by mouth daily.  . clidinium-chlordiazePOXIDE (LIBRAX) 5-2.5 MG capsule Take 1 capsule by mouth 2 (two) times daily as needed (for IBS).  . colestipol (COLESTID) 1 G tablet Take 1 g by mouth 2 (two) times daily.   Marland Kitchen docusate sodium (COLACE) 100 MG capsule Take 1 capsule (100 mg total) by mouth 2 (two) times daily.  Marland Kitchen FLUoxetine (PROZAC) 20 MG capsule Take 20 mg by mouth daily.  . fluticasone (FLONASE) 50 MCG/ACT nasal spray Place 2 sprays into the nose daily.   . Fluticasone-Umeclidin-Vilant (TRELEGY ELLIPTA) 100-62.5-25 MCG/INH AEPB Inhale 1 puff into the lungs daily.  . furosemide (LASIX) 40 MG tablet Take 1 tablet (40 mg total) by mouth daily.  Marland Kitchen glycopyrrolate (ROBINUL) 1 MG tablet Take 1 mg by mouth 3 (three) times daily as needed (for IBS).   . hydroxychloroquine (PLAQUENIL) 200 MG tablet Take 2 tablets (400 mg total) by mouth daily. Stop for 2 weeks, post-op.  Marland Kitchen ipratropium (ATROVENT) 0.03 % nasal spray USE 1 TO 2 SPRAYS INTO BOTH NOSTRILS 2 TIMES DAILY  . ipratropium-albuterol (DUONEB) 0.5-2.5 (3) MG/3ML SOLN Use 1 vial in nebulizer every 6 hours as needed  . leflunomide (ARAVA) 20 MG tablet Take 20 mg by mouth daily.   . methocarbamol (ROBAXIN) 500 MG tablet Take 1 tablet (500 mg total) by mouth every 6 (six) hours as needed for muscle spasms.  . mirtazapine (REMERON) 45 MG  tablet Take 45 mg by mouth at bedtime.  . montelukast (SINGULAIR) 10 MG tablet TAKE 1 TABLET BY MOUTH AT  BEDTIME  . Multiple Vitamin (MULTIVITAMIN) tablet Take 1 tablet by mouth daily.  Marland Kitchen omeprazole (PRILOSEC) 40 MG capsule Take 40 mg by mouth at bedtime.   . OXYGEN Inhale 4-6 mLs into the lungs continuous. 4 during the day, 6 at night  . Pirfenidone (ESBRIET) 801 MG TABS Take 801 mg by mouth 3 (three) times daily.  . [DISCONTINUED] furosemide (LASIX) 40 MG tablet Take 40 mg by mouth daily.  . [DISCONTINUED] potassium chloride SA (KLOR-CON) 20 MEQ tablet TAKE 1 TABLET BY MOUTH  TWICE DAILY  . [DISCONTINUED] rosuvastatin (CRESTOR) 20 MG tablet TAKE 1 TABLET BY MOUTH  DAILY  . [DISCONTINUED] verapamil (CALAN-SR) 240 MG CR tablet TAKE 1 TABLET BY MOUTH  DAILY    Allergies: Cefdinir; Erythromycin base; Lactose; Lactose intolerance (gi); Lactulose; Mesalamine; Nitrofuran derivatives; Nitrofurantoin; Other; Sulfa antibiotics; Tdap [tetanus-diphth-acell pertussis]; and Tetanus toxoid, adsorbed  Social History   Tobacco Use  . Smoking status: Never Smoker  . Smokeless tobacco: Never Used  . Tobacco comment: positive passive tobacco smoke exposure  Substance Use Topics  . Alcohol use: Yes    Comment: glass of wine each night   . Drug use: No    Family History  Problem Relation Age of Onset  .  Heart disease Mother   . Arthritis Mother   . Heart attack Father   . Diabetes Other        sibling  . Heart attack Other        sibling    Review of Systems: A 12-system review of systems was performed and was negative except as noted in the HPI.  --------------------------------------------------------------------------------------------------  Physical Exam: BP 136/80 (BP Location: Left Arm, Patient Position: Sitting, Cuff Size: Normal)   Pulse 91   Ht _0  (1.549 m)   Wt 198 lb (89.8 kg)   SpO2 95%   BMI 37.41 kg/m   General: NAD. HEENT: No conjunctival pallor or scleral icterus.  Facemask in place. Neck: Supple without lymphadenopathy, thyromegaly, JVD, or HJR. Lungs: Supplemental oxygen in place.  Coarse breath sounds bilaterally without wheezes or crackles. Heart: Regular rate and rhythm without murmurs, rubs, or gallops. Non-displaced PMI. Abd: Bowel sounds present. Soft, NT/ND without hepatosplenomegaly Ext: No lower extremity edema. Radial, PT, and DP pulses are 2+ bilaterally. Skin: Warm and dry without rash.  EKG: Normal sinus rhythm with left axis deviation and poor R wave progression.  Axis has shifted slightly to the left since 12/17/2018.  Otherwise, no significant interval change.  Lab Results  Component Value Date   WBC 9.8 12/22/2018   HGB 8.9 (L) 12/22/2018   HCT 29.8 (L) 12/22/2018   MCV 99.7 12/22/2018   PLT 172 12/22/2018    Lab Results  Component Value Date   NA 142 02/13/2019   K 4.2 02/13/2019   CL 106 02/13/2019   CO2 28 02/13/2019   BUN 15 02/13/2019   CREATININE 0.73 02/13/2019   GLUCOSE 93 02/13/2019   ALT 21 05/17/2019    Lab Results  Component Value Date   CHOL 127 01/09/2017   HDL 53 01/09/2017   LDLCALC 38 01/09/2017   LDLDIRECT 53 01/09/2017   TRIG 181 (H) 01/09/2017   CHOLHDL 2.4 01/09/2017    --------------------------------------------------------------------------------------------------  ASSESSMENT AND PLAN: Chronic respiratory failure with hypoxia, chronic HFpEF, and interstitial lung disease: Shortness of breath and hypoxia are likely multifactorial, including ILD and element of chronic HFpEF.  [name] appears euvolemic on exam today.  I think it is reasonable to continue furosemide 40 mg daily.  She should follow closely with [name] for management of ILD.  I encouraged [name] to speak with him about other oxygen supplementation options (e.g. oxygen concentrator) to help facilitate her mobility.  I will request most recent labs from her PCP to ensure stable renal function and potassium in the setting of  furosemide and KCl use.  Nonobstructive coronary artery disease: Breathing has been stable and improved significantly with addition of Esbriet last year.  No chest pain reported.  No indication for repeat ischemia evaluation at this time (normal MPI in 05/2016 and cath with 50% mid LAD stenosis in 09/2016).  Continue aspirin, verapamil, and rosuvastatin.  Hyperlipidemia: Most recent LDL in our system at goal.  We will request more recent labs from PCP.  Continue rosuvastatin.  Follow-up: Virtual visit in 6 months.  Nelva Bush, MD 07/27/2019 11:15 AM

## 2019-07-27 ENCOUNTER — Encounter: Payer: Self-pay | Admitting: Internal Medicine

## 2019-08-02 NOTE — Telephone Encounter (Signed)
L/m on pt vm to call back to schedule pft in March - first available -pr

## 2019-08-08 ENCOUNTER — Encounter: Payer: Self-pay | Admitting: *Deleted

## 2019-08-12 NOTE — Telephone Encounter (Signed)
Patient scheduled for PFT on 10/02/19. Nothing further needed at this time.

## 2019-08-13 ENCOUNTER — Ambulatory Visit: Payer: Medicare Other

## 2019-08-22 ENCOUNTER — Other Ambulatory Visit: Payer: Self-pay | Admitting: Internal Medicine

## 2019-08-24 ENCOUNTER — Ambulatory Visit: Payer: Medicare Other | Attending: Internal Medicine

## 2019-08-24 DIAGNOSIS — Z23 Encounter for immunization: Secondary | ICD-10-CM | POA: Insufficient documentation

## 2019-08-24 NOTE — Progress Notes (Signed)
   Covid-19 Vaccination Clinic  Name:  [name]    MRN: [id number] DOB: [birth year]  08/24/2019  [name] was observed post Covid-19 immunization for 15 minutes without incidence. She was provided with Vaccine Information Sheet and instruction to access the V-Safe system.   [name] was instructed to call 911 with any severe reactions post vaccine: Marland Kitchen Difficulty breathing  . Swelling of your face and throat  . A fast heartbeat  . A bad rash all over your body  . Dizziness and weakness    Immunizations Administered    Name Date Dose VIS Date Route   Pfizer COVID-19 Vaccine 08/24/2019  4:48 PM 0.3 mL 06/28/2019 Intramuscular   Manufacturer: [place]   Lot: [id number]   [place]: [id number]

## 2019-09-04 ENCOUNTER — Telehealth: Payer: Self-pay | Admitting: Internal Medicine

## 2019-09-04 NOTE — Telephone Encounter (Signed)
Spoke with pt. She is wanting to have an appointment on the same day she has her PFT. Pt has been scheduled with [name] on 10/02/19 at 1200. Nothing further was needed.

## 2019-09-10 ENCOUNTER — Ambulatory Visit: Payer: Medicare Other | Admitting: Internal Medicine

## 2019-09-18 ENCOUNTER — Ambulatory Visit: Payer: Medicare Other | Attending: Internal Medicine

## 2019-09-18 DIAGNOSIS — Z23 Encounter for immunization: Secondary | ICD-10-CM

## 2019-09-18 NOTE — Progress Notes (Signed)
   Covid-19 Vaccination Clinic  Name:  [name]    MRN: [id number] DOB: 08/18/1945  09/18/2019  [name] was observed post Covid-19 immunization for 15 minutes without incident. She was provided with Vaccine Information Sheet and instruction to access the V-Safe system.   [name] was instructed to call 911 with any severe reactions post vaccine: Marland Kitchen Difficulty breathing  . Swelling of face and throat  . A fast heartbeat  . A bad rash all over body  . Dizziness and weakness   Immunizations Administered    Name Date Dose VIS Date Route   Pfizer COVID-19 Vaccine 09/18/2019 11:29 AM 0.3 mL 06/28/2019 Intramuscular   Manufacturer: [place]   Lot: [id number]   [place]: [id number]

## 2019-09-28 ENCOUNTER — Other Ambulatory Visit (HOSPITAL_COMMUNITY)
Admission: RE | Admit: 2019-09-28 | Discharge: 2019-09-28 | Disposition: A | Discharge status: Home / Self Care | Payer: Medicare Other | Source: Ambulatory Visit | Attending: Internal Medicine | Admitting: Internal Medicine

## 2019-09-28 DIAGNOSIS — Z20822 Contact with and (suspected) exposure to covid-19: Secondary | ICD-10-CM | POA: Diagnosis not present

## 2019-09-28 DIAGNOSIS — Z01812 Encounter for preprocedural laboratory examination: Secondary | ICD-10-CM | POA: Diagnosis present

## 2019-09-28 LAB — SARS CORONAVIRUS 2 (TAT 6-24 HRS): SARS Coronavirus 2: NEGATIVE

## 2019-10-02 ENCOUNTER — Encounter: Payer: Self-pay | Admitting: Pulmonary Disease

## 2019-10-02 ENCOUNTER — Other Ambulatory Visit: Payer: Self-pay

## 2019-10-02 ENCOUNTER — Ambulatory Visit (INDEPENDENT_AMBULATORY_CARE_PROVIDER_SITE_OTHER): Payer: Medicare Other | Admitting: Pulmonary Disease

## 2019-10-02 ENCOUNTER — Ambulatory Visit (INDEPENDENT_AMBULATORY_CARE_PROVIDER_SITE_OTHER): Payer: Medicare Other | Admitting: Internal Medicine

## 2019-10-02 VITALS — BP 116/76 | HR 85 | Temp 97.1°F | Ht 61.0 in | Wt 201.0 lb

## 2019-10-02 DIAGNOSIS — J4530 Mild persistent asthma, uncomplicated: Secondary | ICD-10-CM | POA: Diagnosis not present

## 2019-10-02 DIAGNOSIS — J849 Interstitial pulmonary disease, unspecified: Secondary | ICD-10-CM | POA: Diagnosis not present

## 2019-10-02 DIAGNOSIS — Z5181 Encounter for therapeutic drug level monitoring: Secondary | ICD-10-CM | POA: Insufficient documentation

## 2019-10-02 DIAGNOSIS — J9611 Chronic respiratory failure with hypoxia: Secondary | ICD-10-CM

## 2019-10-02 DIAGNOSIS — M359 Systemic involvement of connective tissue, unspecified: Secondary | ICD-10-CM | POA: Diagnosis not present

## 2019-10-02 DIAGNOSIS — J328 Other chronic sinusitis: Secondary | ICD-10-CM

## 2019-10-02 DIAGNOSIS — G4733 Obstructive sleep apnea (adult) (pediatric): Secondary | ICD-10-CM

## 2019-10-02 DIAGNOSIS — M069 Rheumatoid arthritis, unspecified: Secondary | ICD-10-CM

## 2019-10-02 DIAGNOSIS — J8489 Other specified interstitial pulmonary diseases: Secondary | ICD-10-CM

## 2019-10-02 DIAGNOSIS — R5381 Other malaise: Secondary | ICD-10-CM

## 2019-10-02 DIAGNOSIS — I272 Pulmonary hypertension, unspecified: Secondary | ICD-10-CM

## 2019-10-02 LAB — PULMONARY FUNCTION TEST
DL/VA % pred: 106 %
DL/VA: 4.45 ml/min/mmHg/L
DLCO cor % pred: 60 %
DLCO cor: 10.42 ml/min/mmHg
DLCO unc % pred: 60 %
DLCO unc: 10.42 ml/min/mmHg
FEF 25-75 Pre: 1.55 L/sec
FEF2575-%Pred-Pre: 103 %
FEV1-%Pred-Pre: 73 %
FEV1-Pre: 1.34 L
FEV1FVC-%Pred-Pre: 110 %
FEV6-%Pred-Pre: 69 %
FEV6-Pre: 1.62 L
FEV6FVC-%Pred-Pre: 105 %
FVC-%Pred-Pre: 66 %
FVC-Pre: 1.62 L
Pre FEV1/FVC ratio: 83 %
Pre FEV6/FVC Ratio: 100 %

## 2019-10-02 LAB — HEPATIC FUNCTION PANEL
ALT: 24 U/L (ref 0–35)
AST: 21 U/L (ref 0–37)
Albumin: 3.8 g/dL (ref 3.5–5.2)
Alkaline Phosphatase: 67 U/L (ref 39–117)
Bilirubin, Direct: 0 mg/dL (ref 0.0–0.3)
Total Bilirubin: 0.3 mg/dL (ref 0.2–1.2)
Total Protein: 6.6 g/dL (ref 6.0–8.3)

## 2019-10-02 MED ORDER — TRELEGY ELLIPTA 100-62.5-25 MCG/INH IN AEPB: 1.0000 | INHALATION_SPRAY | Freq: Every day | RESPIRATORY_TRACT | 4 refills | Status: DC

## 2019-10-02 MED ORDER — FLUTICASONE PROPIONATE 50 MCG/ACT NA SUSP: 2.0000 | Freq: Every day | NASAL | 6 refills | Status: DC

## 2019-10-02 NOTE — Patient Instructions (Addendum)
You were seen today by Lauraine Rinne, NP  for:   It was nice seeing you today we will repeat your breathing test in 6 months.  On your walk today you only required 4 L pulsed with physical activity.  We will proceed forward with ordering a portable oxygen concentrator.  We will also refer you to pulmonary rehab.  Please start working on increasing your overall physical activity every day.  Continue Esbriet.  We will see you back here in 3 months in an ILD clinic slot with [name].  Wyn Quaker, FNP  1. ILD (interstitial lung disease) ([place])  We will refer you to pulmonary rehab today  Continue Esbriet  Lab work today  Please continue to work on increasing your overall physical activity  We will repeat breathing test on you in 6 months  2. Therapeutic drug monitoring  - Hepatic function panel; Future  3. RHINOSINUSITIS, CHRONIC  - fluticasone (FLONASE) 50 MCG/ACT nasal spray; Place 2 sprays into both nostrils daily.  Dispense: 11.1 mL; Refill: 6  4. Mild persistent asthma without complication  - Fluticasone-Umeclidin-Vilant (TRELEGY ELLIPTA) 100-62.5-25 MCG/INH AEPB; Inhale 1 puff into the lungs daily.  Dispense: 3 each; Refill: 4  5. Pulmonary hypertension ([place])  Establish care with Dr. Tempie Hoist as planned  6. Obstructive sleep apnea  We recommend that you continue using your CPAP daily >>>Keep up the hard work using your device >>> Goal should be wearing this for the entire night that you are sleeping, at least 4 to 6 hours  Remember:  . Do not drive or operate heavy machinery if tired or drowsy.  . Please notify the supply company and office if you are unable to use your device regularly due to missing supplies or machine being broken.  . Work on maintaining a healthy weight and following your recommended nutrition plan  . Maintain proper daily exercise and movement  . Maintaining proper use of your device can also help improve management of other chronic illnesses  such as: Blood pressure, blood sugars, and weight management.   BiPAP/ CPAP Cleaning:  >>>Clean weekly, with Dawn soap, and bottle brush.  Set up to air dry. >>> Wipe mask out daily with wet wipe or towelette   7. Rheumatoid arthritis, involving unspecified site, unspecified whether rheumatoid factor present [facility])  Continue follow-up with rheumatology  8. Physical deconditioning  Referral to pulmonary rehab today   We recommend today:  Orders Placed This Encounter  Procedures  . Hepatic function panel    Standing Status:   Future    Standing Expiration Date:   10/01/2020  . AMB referral to pulmonary rehabilitation    Referral Priority:   Routine    Referral Type:   Consultation    Number of Visits Requested:   1   Orders Placed This Encounter  Procedures  . Hepatic function panel  . AMB referral to pulmonary rehabilitation   Meds ordered this encounter  Medications  . fluticasone (FLONASE) 50 MCG/ACT nasal spray    Sig: Place 2 sprays into both nostrils daily.    Dispense:  11.1 mL    Refill:  6  . Fluticasone-Umeclidin-Vilant (TRELEGY ELLIPTA) 100-62.5-25 MCG/INH AEPB    Sig: Inhale 1 puff into the lungs daily.    Dispense:  3 each    Refill:  4    Order Specific Question:   Lot Number?    Answer:   [id number]    Order Specific Question:   Manufacturer?  Answer:   [employer] [[id number]]    Follow Up:    Return in about 3 months (around 01/02/2020), or if symptoms worsen or fail to improve, for Follow up with Dr. Purnell Shoemaker.  ILD clinic slot recall - 36mn   Please do your part to reduce the spread of COVID-19:      Reduce your risk of any infection  and COVID19 by using the similar precautions used for avoiding the common cold or flu:  .Marland KitchenWash your hands often with soap and warm water for at least 20 seconds.  If soap and water are not readily available, use an alcohol-based hand sanitizer with at least 60% alcohol.  . If coughing or sneezing, cover your mouth  and nose by coughing or sneezing into the elbow areas of your shirt or coat, into a tissue or into your sleeve (not your hands). .Langley GaussA MASK when in public  . Avoid shaking hands with others and consider head nods or verbal greetings only. . Avoid touching your eyes, nose, or mouth with unwashed hands.  . Avoid close contact with people who are sick. . Avoid places or events with large numbers of people in one location, like concerts or sporting events. . If you have some symptoms but not all symptoms, continue to monitor at home and seek medical attention if your symptoms worsen. . If you are having a medical emergency, call 911.   [place]/ e-Visit: [email or web address]        [facility]: [facility] [phone]                  [facility] [phone]    It is flu season:   >>> Best ways to protect herself from the flu: Receive the yearly flu vaccine, practice good hand hygiene washing with soap and also using hand sanitizer when available, eat a nutritious meals, get adequate rest, hydrate appropriately   Please contact the office if your symptoms worsen or you have concerns that you are not improving.   Thank you for choosing [facility] for your healthcare, and for allowing [facility] partner with you on your healthcare journey. I am thankful to be able to provide care to you today.   BWyn QuakerFNP-C

## 2019-10-02 NOTE — Assessment & Plan Note (Signed)
Plan: Continue Trelegy Ellipta

## 2019-10-02 NOTE — Assessment & Plan Note (Signed)
Plan: Continue CPAP therapy Continue follow-up with Dr. Annamaria Boots

## 2019-10-02 NOTE — Assessment & Plan Note (Signed)
Plan: Complete scheduled follow-up with heart failure clinic

## 2019-10-02 NOTE — Assessment & Plan Note (Signed)
Plan: We will refill Flonase today

## 2019-10-02 NOTE — Assessment & Plan Note (Signed)
This continues to be a struggle for the patient Patient not exercising often at home  Plan: Referral to pulmonary rehab today

## 2019-10-02 NOTE — Assessment & Plan Note (Signed)
Plan: Continue medications as outlined by rheumatology Continue follow-up with rheumatology

## 2019-10-02 NOTE — Assessment & Plan Note (Signed)
Plan: Lab work today

## 2019-10-02 NOTE — Progress Notes (Signed)
Spiro/DLCO performed today. 

## 2019-10-02 NOTE — Assessment & Plan Note (Signed)
Plan: Continue to maintain oxygen saturations between 88 to 92% We will place order for portable oxygen concentrator today to hopefully help improve mobility

## 2019-10-02 NOTE — Progress Notes (Signed)
_0  ID: [name], female    DOB: 1945-10-07, 74 y.o.   MRN: [id number]  Chief Complaint  Patient presents with  . Follow-up    F/U after PFT. States her breathing has been ok since last visit. Still using 4L of O2.     Referring provider: Lorene Dy, MD  HPI:  74 year old female never smoker followed in our office for interstitial lung disease, bronchiectasis, pulmonary fibrosis, chronic respiratory failure  PMH: Anxiety, GERD, pulmonary hypertension, hyperlipidemia, CHF Smoker/ Smoking History: Never smoker Maintenance: Esbriet Pt of: [name]  10/02/2019  - Visit   74 year old female never smoker followed in our office for interstitial lung disease.  At last office visit with [name] on 05/17/2019 patient's assessment and plan was as follows:  #ILD  - Glad you are tolerating esbriet well - Clinically you are stable - Glad you got emergency generator Plan  - check ONO on 4L with CPAP - maybe this is adquieate instead of 6L  - continue esbriet as before - check LFT 05/17/2019  - continue RA treatment through rheumatologist - take ILD-PRO registry consent form- [facility] coordinatory will schedule your first visit after you have read the consent  #Hx of asthma  STable Plan  - continue trelegy and singulair  #Sinusitis  - suspect yellow sputum and worsening cough is related to sinus issues and winter changes - GEt CT scan sinus without contrast    - based on results refer to DR Wilburn Cornelia  Followup - spirometry and dlco in 3-4 months  - return to ILD clinic in feb  -return sooner if needed  Patient completed CT scan on 05/23/2019.  Showed no acute or chronic sinus disease.  Patient presenting today after completing pulmonary function testing.  Pulmonary function testing results are listed below:  10/02/2019-pulmonary function test-FVC 1.62 (66% predicted), ratio 83, FEV1 1.34) 73% predicted, DLCO 10.42 (60% predicted).  Patient has upcoming  scheduled appointments with cardiology Dr. Haroldine Laws as well as upcoming follow-ups with rheumatology Dr. Trudie Reed.  Patient continues to struggle with increasing physical activity.  She struggles with daily exercise.  She is interested in obtaining a portable oxygen concentrator.  Walk today in office patient was able to maintain saturations above 88% on 4 L pulsed with a POC.  We will coordinate and discuss this today.    Questionaires / Pulmonary Flowsheets:   MMRC: mMRC Dyspnea Scale mMRC Score  10/02/2019 0   Tests:   05/23/2019-CT maxillofacial-no acute or chronic sinus disease  08/20/2018-CT chest high-res-severe tracheobronchomalacia, interval progression of basilar prone dominant fibrotic interstitial lung disease with particularly increased groundglass component, no honeycombing, findings most compatible with UIP based on patient's rheumatoid arthritis findings.  Stable dilated main pulmonary artery suggesting chronic pulmonary arterial hypertension  05/17/2019-pulmonary function test-FVC  1.82 (74% predicted), ratio 83, FEV1 1.51 (82% predicted), DLCO 11.28 (65% predicted)   FENO:  Lab Results  Component Value Date   NITRICOXIDE 23 07/31/2018    PFT: PFT Results Latest Ref Rng & Units 10/02/2019 05/17/2019 10/01/2018 09/03/2015  FVC-Pre L 1.62 1.82 1.81 2.18  FVC-Predicted Pre % 66 74 72 84  FVC-Post L - - - 2.17  FVC-Predicted Post % - - - 83  Pre FEV1/FVC % % 83 83 75 79  Post FEV1/FCV % % - - - 83  FEV1-Pre L 1.34 1.51 1.36 1.73  FEV1-Predicted Pre % 73 82 73 88  FEV1-Post L - - - 1.80  DLCO UNC% % 60 65 69 73  DLCO COR %Predicted % 106 99 98 94  TLC L - - - 3.63  TLC % Predicted % - - - 78  RV % Predicted % - - - 61    WALK:  No flowsheet data found.  Imaging: No results found.  Lab Results:  CBC    Component Value Date/Time   WBC 9.8 12/22/2018 0326   RBC 2.99 (L) 12/22/2018 0326   HGB 8.9 (L) 12/22/2018 0326   HGB 14.4 09/23/2016 1252   HCT 29.8  (L) 12/22/2018 0326   HCT 44.7 09/23/2016 1252   PLT 172 12/22/2018 0326   PLT 342 09/23/2016 1252   MCV 99.7 12/22/2018 0326   MCV 88 09/23/2016 1252   MCH 29.8 12/22/2018 0326   MCHC 29.9 (L) 12/22/2018 0326   RDW 14.5 12/22/2018 0326   RDW 15.4 09/23/2016 1252   LYMPHSABS 1.0 12/17/2018 1349   LYMPHSABS 1.2 09/23/2016 1252   MONOABS 0.9 12/17/2018 1349   EOSABS 0.1 12/17/2018 1349   EOSABS 0.0 09/23/2016 1252   BASOSABS 0.0 12/17/2018 1349   BASOSABS 0.0 09/23/2016 1252    BMET    Component Value Date/Time   NA 142 02/13/2019 1358   NA 142 01/09/2017 1024   K 4.2 02/13/2019 1358   CL 106 02/13/2019 1358   CO2 28 02/13/2019 1358   GLUCOSE 93 02/13/2019 1358   BUN 15 02/13/2019 1358   BUN 18 01/09/2017 1024   CREATININE 0.73 02/13/2019 1358   CREATININE 0.91 05/30/2016 1002   CALCIUM 10.3 02/13/2019 1358   GFRNONAA >60 12/22/2018 0326   GFRAA >60 12/22/2018 0326    BNP No results found for: BNP  ProBNP    Component Value Date/Time   PROBNP 38.0 07/31/2018 1735    Specialty Problems      Pulmonary Problems   ASTHMATIC BRONCHITIS, ACUTE    Qualifier: Diagnosis of  By: Annamaria Boots MD, Clinton D       Seasonal and perennial allergic rhinitis    Allergy vaccine 1:50,000 07/13/00; 1:10 06/11/08 GO       RHINOSINUSITIS, CHRONIC    Qualifier: Diagnosis of  By: Annamaria Boots MD, Clinton D       Asthmatic bronchitis, moderate persistent, uncomplicated    Office Spirometry 02/21/17-moderate restriction consistent with body habitus. FVC 1.70/66%, FEV1 1.49/77%, ratio 0.87, FEF 25-75% 2.39/146%.  03/15/18 - FENO - 49       Bronchitis, chronic obstructive, with exacerbation ([place])    Qualifier: Diagnosis of  By: Annamaria Boots MD, Clinton D       CAP (community acquired pneumonia)   Hypoxia   Influenza A with pneumonia   ILD (interstitial lung disease) ([place])    HRCT chest 08/27/2015, 03/14/16-changes grossly stable CT chest Hi Res 02/02/2018-mild progression   08/20/2018-CT  chest high-res- severe tracheomalacia, interval progression of basilar predominant fibrotic interstitial lung disease with particularly increased groundglass component no honeycombing, 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 on the basis of patient's rheumatoid arthritis, stable dilated main pulmonary artery  10/01/2018- spirometry with DLCO-FVC 1.81 (72% predicted), ratio 75, FEV1 1.36 (73% predicted), DLCO 69       Obstructive sleep apnea    Unattended Home Sleep Test 08/12/2016-AHI 18.2/hour, desaturation to 75% with average 88%, body weight 180 pounds CPAP titration sleep study 11/09/16.-6 cwp-  Needed supplemental oxygen because of sustained low saturation with 17.5 minutes recording saturation less than 88% on CPAP      Shortness of breath   Acute on chronic respiratory failure with hypoxia (  [place])   Bronchiectasis without complication ([place])    9/62/2297-LG chest high-res-Spectrum of findings compatible for UIP, presumably on the basis of patient's history of rheumatoid arthritis, mild patchy air trapping, stable dilated main pulmonary artery, traction bronchiectasis, tiny area of honeycombing in the right upper lobe      Pulmonary fibrosis ([facility])   Chronic respiratory failure with hypoxia ([facility])   Mild persistent asthma without complication      Allergies  Allergen Reactions  . Cefdinir Diarrhea  . Erythromycin Base Diarrhea    Other  . Lactose     Diarrhea    . Lactose Intolerance (Gi) Diarrhea  . Lactulose Diarrhea  . Mesalamine Nausea Only  . Nitrofuran Derivatives     shakiness  . Nitrofurantoin Other (See Comments)    shakiness other  . Other Diarrhea and Other (See Comments)    Shaking uncontrollably "lettuce only" "lettuce only"  . Sulfa Antibiotics Other (See Comments)    Patient can't recall reaction   . Tdap [Tetanus-Diphth-Acell Pertussis]     Shaking uncontrollably  . Tetanus Toxoid, Adsorbed Other (See Comments)    Shaking uncontrollable     Immunization  History  Administered Date(s) Administered  . Fluad Quad(high Dose 65+) 03/15/2019  . Influenza Split 03/18/2012, 03/18/2013, 04/14/2014, 04/17/2017  . Influenza Whole 04/30/2008, 04/20/2009, 04/18/2011  . Influenza, High Dose Seasonal PF 04/23/2015, 03/24/2016, 04/06/2018  . Influenza,inj,quad, With Preservative 04/06/2018  . PFIZER SARS-COV-2 Vaccination 08/24/2019, 09/18/2019  . Pneumococcal Conjugate-13 11/11/2013  . Pneumococcal Polysaccharide-23 05/21/2018  . Zoster Recombinat (Shingrix) 05/05/2017    Past Medical History:  Diagnosis Date  . Acute asthmatic bronchitis   . Allergic rhinitis   . Anxiety   . Arthritis   . Esophageal reflux   . Hypertension   . Interstitial lung disease ([place]) dx jan 2020  . Irritable bowel syndrome   . Oxygen dependent    4 liters day time 6 liters at night  . PONV (postoperative nausea and vomiting)    ponv likes zofran, and scopolamine patch  . Rheumatoid arthritis(714.0)   . Sleep apnea     Tobacco History: Social History   Tobacco Use  Smoking Status Never Smoker  Smokeless Tobacco Never Used  Tobacco Comment   positive passive tobacco smoke exposure   Counseling given: Yes Comment: positive passive tobacco smoke exposure   Continue to not smoke  Outpatient Encounter Medications as of 10/02/2019  Medication Sig  . albuterol (VENTOLIN HFA) 108 (90 Base) MCG/ACT inhaler USE 2 PUFFS BY MOUTH EVERY  6 HOURS AS NEEDED  . aspirin EC 81 MG tablet Take 81 mg by mouth daily.  . benzonatate (TESSALON) 200 MG capsule TAKE 1 CAPSULE BY MOUTH 3 TIMES A DAY AS NEEDED FOR COUGH (Patient taking differently: Take 200 mg by mouth 3 (three) times daily as needed for cough. TAKE 1 CAPSULE BY MOUTH 3 TIMES A DAY AS NEEDED FOR COUGH)  . cetirizine (ZYRTEC) 10 MG tablet Take 10 mg by mouth daily.  . clidinium-chlordiazePOXIDE (LIBRAX) 5-2.5 MG capsule Take 1 capsule by mouth 2 (two) times daily as needed (for IBS).  . colestipol (COLESTID) 1 G  tablet Take 1 g by mouth 2 (two) times daily.   Marland Kitchen dicyclomine (BENTYL) 20 MG tablet Take 20 mg by mouth 4 (four) times daily -  before meals and at bedtime.  Marland Kitchen FLUoxetine (PROZAC) 20 MG capsule Take 20 mg by mouth daily.  . fluticasone (FLONASE) 50 MCG/ACT nasal spray Place 2 sprays into both  nostrils daily.  . Fluticasone-Umeclidin-Vilant (TRELEGY ELLIPTA) 100-62.5-25 MCG/INH AEPB Inhale 1 puff into the lungs daily.  . furosemide (LASIX) 40 MG tablet TAKE 1 TABLET BY MOUTH  TWICE DAILY  . glycopyrrolate (ROBINUL) 1 MG tablet Take 1 mg by mouth 3 (three) times daily as needed (for IBS).   . hydroxychloroquine (PLAQUENIL) 200 MG tablet Take 2 tablets (400 mg total) by mouth daily. Stop for 2 weeks, post-op.  Marland Kitchen ipratropium (ATROVENT) 0.03 % nasal spray USE 1 TO 2 SPRAYS INTO BOTH NOSTRILS 2 TIMES DAILY  . ipratropium-albuterol (DUONEB) 0.5-2.5 (3) MG/3ML SOLN Use 1 vial in nebulizer every 6 hours as needed  . leflunomide (ARAVA) 20 MG tablet Take 20 mg by mouth daily.   . methocarbamol (ROBAXIN) 500 MG tablet Take 1 tablet (500 mg total) by mouth every 6 (six) hours as needed for muscle spasms.  . mirtazapine (REMERON) 45 MG tablet Take 45 mg by mouth at bedtime.  . montelukast (SINGULAIR) 10 MG tablet TAKE 1 TABLET BY MOUTH AT  BEDTIME  . Multiple Vitamin (MULTIVITAMIN) tablet Take 1 tablet by mouth daily.  Marland Kitchen omeprazole (PRILOSEC) 40 MG capsule Take 40 mg by mouth at bedtime.   . OXYGEN Inhale 4-6 mLs into the lungs continuous. 4 during the day, 6 at night  . Pirfenidone (ESBRIET) 801 MG TABS Take 801 mg by mouth 3 (three) times daily.  . potassium chloride SA (KLOR-CON) 20 MEQ tablet Take 1 tablet (20 mEq total) by mouth 2 (two) times daily.  . predniSONE (DELTASONE) 5 MG tablet Take 5 mg by mouth daily with breakfast.  . rosuvastatin (CRESTOR) 20 MG tablet Take 1 tablet (20 mg total) by mouth daily.  . verapamil (CALAN-SR) 240 MG CR tablet TAKE 1 TABLET BY MOUTH  DAILY  . [DISCONTINUED]  fluticasone (FLONASE) 50 MCG/ACT nasal spray Place 2 sprays into the nose daily.   . [DISCONTINUED] Fluticasone-Umeclidin-Vilant (TRELEGY ELLIPTA) 100-62.5-25 MCG/INH AEPB Inhale 1 puff into the lungs daily.  . [DISCONTINUED] ALPRAZolam (XANAX) 0.5 MG tablet Take 0.5 mg by mouth as needed for anxiety.   . [DISCONTINUED] docusate sodium (COLACE) 100 MG capsule Take 1 capsule (100 mg total) by mouth 2 (two) times daily.   No facility-administered encounter medications on file as of 10/02/2019.     Review of Systems  Review of Systems  Constitutional: Positive for activity change and fatigue. Negative for fever.  HENT: Positive for congestion. Negative for sinus pressure, sinus pain and sore throat.   Respiratory: Negative for cough, shortness of breath and wheezing.   Cardiovascular: Negative for chest pain and palpitations.  Gastrointestinal: Negative for diarrhea, nausea and vomiting.  Musculoskeletal: Negative for arthralgias.  Neurological: Negative for dizziness.  Psychiatric/Behavioral: Negative for sleep disturbance. The patient is not nervous/anxious.      Physical Exam  BP 116/76 (BP Location: Right Arm, Patient Position: Sitting, Cuff Size: Normal)   Pulse 85   Temp (!) 97.1 F (36.2 C) (Temporal)   Ht _0  (1.549 m)   Wt 201 lb (91.2 kg)   SpO2 98% Comment: on 4L  BMI 37.98 kg/m   Wt Readings from Last 5 Encounters:  10/02/19 201 lb (91.2 kg)  07/25/19 198 lb (89.8 kg)  05/17/19 215 lb (97.5 kg)  03/15/19 207 lb (93.9 kg)  03/07/19 202 lb (91.6 kg)    BMI Readings from Last 5 Encounters:  10/02/19 37.98 kg/m  07/25/19 37.41 kg/m  05/17/19 40.62 kg/m  03/15/19 39.11 kg/m  03/07/19 38.17 kg/m  Physical Exam Vitals and nursing note reviewed.  Constitutional:      General: She is not in acute distress.    Appearance: Normal appearance. She is obese.  HENT:     Head: Normocephalic and atraumatic.     Right Ear: External ear normal.     Left Ear:  External ear normal.     Mouth/Throat:     Mouth: Mucous membranes are moist.     Pharynx: Oropharynx is clear.  Eyes:     Pupils: Pupils are equal, round, and reactive to light.  Cardiovascular:     Rate and Rhythm: Normal rate and regular rhythm.     Pulses: Normal pulses.     Heart sounds: Normal heart sounds. No murmur.  Pulmonary:     Breath sounds: No decreased air movement. No decreased breath sounds, wheezing or rales.     Comments: Diminished breath sounds on exam Musculoskeletal:     Cervical back: Normal range of motion.     Right lower leg: No edema.     Left lower leg: No edema.  Skin:    General: Skin is warm and dry.     Capillary Refill: Capillary refill takes less than 2 seconds.  Neurological:     General: No focal deficit present.     Mental Status: She is alert and oriented to person, place, and time. Mental status is at baseline.     Gait: Gait abnormal (Walks with walker, completed walk in office today).  Psychiatric:        Mood and Affect: Mood normal.        Behavior: Behavior normal.        Thought Content: Thought content normal.        Judgment: Judgment normal.       Assessment & Plan:   Physical deconditioning This continues to be a struggle for the patient Patient not exercising often at home  Plan: Referral to pulmonary rehab today  Therapeutic drug monitoring Plan: Lab work today  Rheumatoid arthritis ([place]) Plan: Continue medications as outlined by rheumatology Continue follow-up with rheumatology  RHINOSINUSITIS, CHRONIC Plan: We will refill Flonase today  Obstructive sleep apnea Plan: Continue CPAP therapy Continue follow-up with Dr. Annamaria Boots  Mild persistent asthma without complication Plan: Continue Trelegy Ellipta  ILD (interstitial lung disease) ([place]) Plan: Continue Esbriet Follow-up with [name] at patient's request in 12 weeks Continue follow-up with rheumatology Referral to pulmonary rehab Repeat  spirometry and dlco in 6 months Continue to work diligently on increasing physical activity and working on reducing weight  Pulmonary hypertension ([place]) Plan: Complete scheduled follow-up with heart failure clinic  Chronic respiratory failure with hypoxia ([place]) Plan: Continue to maintain oxygen saturations between 88 to 92% We will place order for portable oxygen concentrator today to hopefully help improve mobility    Return in about 3 months (around 01/02/2020), or if symptoms worsen or fail to improve, for Follow up with Dr. Purnell Shoemaker.   Lauraine Rinne, NP 10/02/2019   This appointment required 44 minutes of patient care (this includes precharting, chart review, review of results, face-to-face care, etc.).

## 2019-10-02 NOTE — Assessment & Plan Note (Addendum)
Plan: Continue Esbriet Follow-up with [name] at patient's request in 12 weeks Continue follow-up with rheumatology Referral to pulmonary rehab Repeat spirometry and dlco in 6 months Continue to work diligently on increasing physical activity and working on reducing weight

## 2019-10-03 ENCOUNTER — Encounter (HOSPITAL_COMMUNITY): Payer: Self-pay | Admitting: *Deleted

## 2019-10-03 ENCOUNTER — Telehealth: Payer: Self-pay | Admitting: Pulmonary Disease

## 2019-10-03 NOTE — Progress Notes (Signed)
Received referral from Dr. Jamey Reas for this pt to participate in pulmonary rehab with the the diagnosis of ILD.  Pt is well known to pulmonary rehab staff. Pt participated previously up into departmental closure related to Covid-19.   Clinical review of pt follow up appt on 10/02/19 with Wyn Quaker NP. Pulmonary office note.  Pt with Covid Risk Score - 6. Pt has upcoming appt on 4/23 with Dr. Haroldine Laws as a new consult for pulmonary hypertension. Pt appropriate for scheduling for Pulmonary rehab.  Will forward to support staff for verification of insurance eligibility/benefits and pulmonary rehab staff for scheduling with pt consent. Cherre Huger, BSN Cardiac and Training and development officer

## 2019-10-03 NOTE — Telephone Encounter (Signed)
Lauraine Rinne, NP sent to [name], CMA  Labs stable. Continue esbriet.   [place] with pt and notified of results per [name].  Pt verbalized understanding and denied any questions.

## 2019-10-03 NOTE — Progress Notes (Signed)
Pt notified of results

## 2019-10-04 ENCOUNTER — Telehealth (HOSPITAL_COMMUNITY): Payer: Self-pay

## 2019-10-04 NOTE — Telephone Encounter (Signed)
Pt insurance is active and benefits verified through Medicare a/b Co-pay 0, DED $203/$203 met, out of pocket 0/0 met, co-insurance 20%. no pre-authorization required.  2ndary insurance is active and benefits verified through [employer]. Co-pay 0, DED 0/0 met, out of pocket 0/0 met, co-insurance 0. No pre-authorization required.  

## 2019-10-08 ENCOUNTER — Telehealth (HOSPITAL_COMMUNITY): Payer: Self-pay

## 2019-10-11 NOTE — Telephone Encounter (Signed)
Work for me I am happy to sign the referral so the patient can obtain this device.Wyn Quaker, FNP

## 2019-10-11 NOTE — Telephone Encounter (Signed)
[name] just an [employer] note from patient  Wyn Quaker, my husband spoke with [employer] this morning.  They will be sending you a referral request for the [employer] one G5 system.  This is the unit we would like to purchase. Regards, Jayelle Page  We will keep an eye out for referral request.

## 2019-10-14 ENCOUNTER — Telehealth: Payer: Self-pay | Admitting: Pulmonary Disease

## 2019-10-14 NOTE — Telephone Encounter (Signed)
Received [employer] form for patient's POC. On the form, it had an order for an at home concentrator, which is not something the patient had discussed during her last visit OV. I called and spoke with her to confirm that she is only requesting a POC. She confirmed that she is only wanting the POC. She plans on keeping her concentrator from [employer] as long as insurance will continue to pay for it. I advised her that I would write on the order form that she is only interested in the [place]. She verbalized understanding. Will hand form back to [place].   Nothing further needed at time of call.

## 2019-10-16 ENCOUNTER — Telehealth (HOSPITAL_COMMUNITY): Payer: Self-pay | Admitting: Internal Medicine

## 2019-10-17 ENCOUNTER — Telehealth: Payer: Self-pay | Admitting: Internal Medicine

## 2019-10-17 ENCOUNTER — Telehealth (HOSPITAL_COMMUNITY): Payer: Self-pay

## 2019-10-17 NOTE — Telephone Encounter (Signed)
Spoke with patient and her husband Herbie Baltimore. Herbie Baltimore stated that they have discussed the O2 order for [employer] have decided to get the home concentrator from them as well. The concentrator from [employer] is 18lbs and they will be able to travel with this. They want to keep the concentrator from [facility] as well. I advised him that once we receive the new forms, we will fill them out and fax them back to [employer]. He verbalized understanding.   Nothing further needed at time of call.

## 2019-11-03 ENCOUNTER — Other Ambulatory Visit: Payer: Self-pay | Admitting: Internal Medicine

## 2019-11-07 ENCOUNTER — Telehealth (HOSPITAL_COMMUNITY): Payer: Self-pay | Admitting: *Deleted

## 2019-11-07 NOTE — Telephone Encounter (Signed)
Called to remind patient of her walk test/orientation in pulmonary rehab 11/11/2019 @ 1030.  Patient confirmed.

## 2019-11-08 ENCOUNTER — Other Ambulatory Visit: Payer: Self-pay

## 2019-11-08 ENCOUNTER — Ambulatory Visit (HOSPITAL_COMMUNITY)
Admission: RE | Admit: 2019-11-08 | Discharge: 2019-11-08 | Disposition: A | Discharge status: Home / Self Care | Payer: Medicare Other | Source: Ambulatory Visit | Attending: Internal Medicine | Admitting: Internal Medicine

## 2019-11-08 ENCOUNTER — Encounter (HOSPITAL_COMMUNITY): Payer: Self-pay | Admitting: Internal Medicine

## 2019-11-08 VITALS — BP 118/76 | HR 84 | Ht 61.0 in | Wt 203.0 lb

## 2019-11-08 DIAGNOSIS — I2510 Atherosclerotic heart disease of native coronary artery without angina pectoris: Secondary | ICD-10-CM | POA: Diagnosis not present

## 2019-11-08 DIAGNOSIS — J849 Interstitial pulmonary disease, unspecified: Secondary | ICD-10-CM | POA: Insufficient documentation

## 2019-11-08 DIAGNOSIS — J8410 Pulmonary fibrosis, unspecified: Secondary | ICD-10-CM | POA: Insufficient documentation

## 2019-11-08 DIAGNOSIS — Z7952 Long term (current) use of systemic steroids: Secondary | ICD-10-CM | POA: Diagnosis not present

## 2019-11-08 DIAGNOSIS — Z7982 Long term (current) use of aspirin: Secondary | ICD-10-CM | POA: Diagnosis not present

## 2019-11-08 DIAGNOSIS — Z8249 Family history of ischemic heart disease and other diseases of the circulatory system: Secondary | ICD-10-CM | POA: Insufficient documentation

## 2019-11-08 DIAGNOSIS — R0602 Shortness of breath: Secondary | ICD-10-CM

## 2019-11-08 DIAGNOSIS — I5032 Chronic diastolic (congestive) heart failure: Secondary | ICD-10-CM | POA: Diagnosis not present

## 2019-11-08 DIAGNOSIS — J9611 Chronic respiratory failure with hypoxia: Secondary | ICD-10-CM | POA: Insufficient documentation

## 2019-11-08 DIAGNOSIS — K589 Irritable bowel syndrome without diarrhea: Secondary | ICD-10-CM | POA: Diagnosis not present

## 2019-11-08 DIAGNOSIS — E669 Obesity, unspecified: Secondary | ICD-10-CM | POA: Insufficient documentation

## 2019-11-08 DIAGNOSIS — I5030 Unspecified diastolic (congestive) heart failure: Secondary | ICD-10-CM | POA: Insufficient documentation

## 2019-11-08 DIAGNOSIS — K219 Gastro-esophageal reflux disease without esophagitis: Secondary | ICD-10-CM | POA: Diagnosis not present

## 2019-11-08 DIAGNOSIS — M069 Rheumatoid arthritis, unspecified: Secondary | ICD-10-CM | POA: Insufficient documentation

## 2019-11-08 DIAGNOSIS — I2721 Secondary pulmonary arterial hypertension: Secondary | ICD-10-CM | POA: Diagnosis not present

## 2019-11-08 DIAGNOSIS — F419 Anxiety disorder, unspecified: Secondary | ICD-10-CM | POA: Insufficient documentation

## 2019-11-08 DIAGNOSIS — Z882 Allergy status to sulfonamides status: Secondary | ICD-10-CM | POA: Diagnosis not present

## 2019-11-08 DIAGNOSIS — G4730 Sleep apnea, unspecified: Secondary | ICD-10-CM | POA: Diagnosis not present

## 2019-11-08 DIAGNOSIS — Z881 Allergy status to other antibiotic agents status: Secondary | ICD-10-CM | POA: Insufficient documentation

## 2019-11-08 DIAGNOSIS — I110 Hypertensive heart disease with heart failure: Secondary | ICD-10-CM | POA: Insufficient documentation

## 2019-11-08 DIAGNOSIS — Z79899 Other long term (current) drug therapy: Secondary | ICD-10-CM | POA: Insufficient documentation

## 2019-11-08 DIAGNOSIS — Z9981 Dependence on supplemental oxygen: Secondary | ICD-10-CM | POA: Insufficient documentation

## 2019-11-08 NOTE — Progress Notes (Signed)
ADVANCED HF CLINIC CONSULT NOTE  Referring Physician: Dr. Trudie Reed Primary Cardiologist: Dr. Saunders Revel Pulmonary: [name]  HPI:  74 year old female with RA, obesity, interstitial lung disease, bronchiectasis, pulmonary fibrosis, chronic respiratory failure on home O2, diastolic HF  PFTs  0/35/5974 FEV1 1.34 (73% predicted) FVC 1.62 (66% predicted), ratio 83 DLCO 10.42 (60% predicted).  1/20 Hi-res CT 1. Severe tracheobronchomalacia, more pronounced on today's scan. 2. Interval progression of basilar predominant fibrotic interstitial lung disease with particularly increased ground-glass component. No honeycombing. 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 3. 3vCAD  She is referred today for further evaluation of SOB. Says she feels she is doing somewhat better. Starting Pulmonary Rehab on 5/4. Uses 4L with exertion and at rest. Uses CPAP at night. Feels like lung disease is stable with Esbriet. SOB with mild activity. Mild LE edema. Managed with lasix 40 daily. Was 115 pounds when she graduated HS.   Last echo 10/17 EF 55-60% RV normal.   Cath 3/18 LAD 50% RCA 20%   3/18:   RA (mean): 13 mmHg RV (S/EDP): 38/12 mmHg PA (S/D, mean): 43/23 (32) mmHg PCWP (mean): 16 mmHg  Ao sat: 94% PA sat: 70% RA sat: 75%  Fick CO: 6.4 L/min Fick CI: 3.7 L/min/m^2  PVR: 2.5 Wood units   Review of Systems: [y] = yes, _0  = no   General: Weight gain Blue.Reese ]; Weight loss _1 ; Anorexia _2 ; Fatigue _3 ; Fever _4 ; Chills _5 ; Weakness Blue.Reese ]  Cardiac: Chest pain/pressure _6 ; Resting SOB [ y]; Exertional SOB Blue.Reese ]; Orthopnea _7 ; Pedal Edema Blue.Reese ]; Palpitations _8 ; Syncope _9 ; Presyncope _10 ; Paroxysmal nocturnal dyspnea_11   Pulmonary: Cough Blue.Reese ]; Wheezing_12 ; Hemoptysis_13 ; Sputum _14 ; Snoring _15   GI: Vomiting_16 ; Dysphagia_17 ; Melena_18 ; Hematochezia _19 ; Heartburn_20 ; Abdominal pain Blue.Reese ]; Constipation _21 ;  Diarrhea _22 ; BRBPR _23   GU: Hematuria_24 ; Dysuria _25 ; Nocturia_26   Vascular: Pain in legs with walking _27 ; Pain in feet with lying flat _28 ; Non-healing sores _29 ; Stroke _30 ; TIA _31 ; Slurred speech _32 ;  Neuro: Headaches[y ]; Vertigo_33 ; Seizures_34 ; Paresthesias_35 ;Blurred vision _36 ; Diplopia _37 ; Vision changes _38   Ortho/Skin: Arthritis Blue.Reese ]; Joint pain Blue.Reese ]; Muscle pain _39 ; Joint swelling _40 ; Back Pain _41 ; Rash _42   Psych: Depression_43 ; Anxiety[y ]  Heme: Bleeding problems _44 ; Clotting disorders _45 ; Anemia _46   Endocrine: Diabetes _47 ; Thyroid dysfunction_48    Past Medical History:  Diagnosis Date  . Acute asthmatic bronchitis   . Allergic rhinitis   . Anxiety   . Arthritis   . Esophageal reflux   . Hypertension   . Interstitial lung disease ([place]) dx jan 2020  . Irritable bowel syndrome   . Oxygen dependent    4 liters day time 6 liters at night  . PONV (postoperative nausea and vomiting)    ponv likes zofran, and scopolamine patch  . Rheumatoid arthritis(714.0)   . Sleep apnea     Current Outpatient Medications  Medication Sig Dispense Refill  . albuterol (VENTOLIN HFA) 108 (90 Base) MCG/ACT inhaler USE 2 PUFFS BY MOUTH EVERY  6 HOURS AS NEEDED 34 g 2  . aspirin EC 81 MG tablet Take 81 mg by mouth daily.    . benzonatate (  TESSALON) 200 MG capsule TAKE 1 CAPSULE BY MOUTH 3 TIMES A DAY AS NEEDED FOR COUGH (Patient taking differently: Take 200 mg by mouth 3 (three) times daily as needed for cough. TAKE 1 CAPSULE BY MOUTH 3 TIMES A DAY AS NEEDED FOR COUGH) 30 capsule 3  . cetirizine (ZYRTEC) 10 MG tablet Take 10 mg by mouth daily.    . clidinium-chlordiazePOXIDE (LIBRAX) 5-2.5 MG capsule Take 1 capsule by mouth 2 (two) times daily as needed (for IBS).    . colestipol (COLESTID) 1 G tablet Take 1 g by mouth 2 (two) times daily.     Marland Kitchen dicyclomine (BENTYL) 20 MG tablet Take 20 mg by mouth 4 (four) times daily -  before meals and at bedtime.    Marland Kitchen FLUoxetine (PROZAC) 20  MG capsule Take 20 mg by mouth daily.    . fluticasone (FLONASE) 50 MCG/ACT nasal spray Place 2 sprays into both nostrils daily. 11.1 mL 6  . Fluticasone-Umeclidin-Vilant (TRELEGY ELLIPTA) 100-62.5-25 MCG/INH AEPB Inhale 1 puff into the lungs daily. 3 each 4  . furosemide (LASIX) 40 MG tablet TAKE 1 TABLET BY MOUTH  TWICE DAILY 180 tablet 3  . glycopyrrolate (ROBINUL) 1 MG tablet Take 1 mg by mouth 3 (three) times daily as needed (for IBS).     . hydroxychloroquine (PLAQUENIL) 200 MG tablet Take 2 tablets (400 mg total) by mouth daily. Stop for 2 weeks, post-op.    Marland Kitchen ipratropium (ATROVENT) 0.03 % nasal spray USE 1 TO 2 SPRAYS INTO BOTH NOSTRILS 2 TIMES DAILY 90 mL 1  . ipratropium-albuterol (DUONEB) 0.5-2.5 (3) MG/3ML SOLN USE 1 VIAL IN NEBULIZER EVERY 6 HOURS AS NEEDED *J45.901* npi [id number] 270 mL 11  . leflunomide (ARAVA) 20 MG tablet Take 20 mg by mouth daily.     . methocarbamol (ROBAXIN) 500 MG tablet Take 1 tablet (500 mg total) by mouth every 6 (six) hours as needed for muscle spasms. 40 tablet 0  . mirtazapine (REMERON) 45 MG tablet Take 45 mg by mouth at bedtime.    . montelukast (SINGULAIR) 10 MG tablet TAKE 1 TABLET BY MOUTH AT  BEDTIME 90 tablet 3  . Multiple Vitamin (MULTIVITAMIN) tablet Take 1 tablet by mouth daily.    Marland Kitchen omeprazole (PRILOSEC) 40 MG capsule Take 40 mg by mouth at bedtime.     . OXYGEN Inhale 4 mLs into the lungs continuous.     . potassium chloride SA (KLOR-CON) 20 MEQ tablet Take 1 tablet (20 mEq total) by mouth 2 (two) times daily. 180 tablet 3  . predniSONE (DELTASONE) 5 MG tablet Take 5 mg by mouth daily with breakfast.    . rosuvastatin (CRESTOR) 20 MG tablet Take 1 tablet (20 mg total) by mouth daily. 90 tablet 3  . verapamil (CALAN-SR) 240 MG CR tablet TAKE 1 TABLET BY MOUTH  DAILY 90 tablet 3  . Pirfenidone (ESBRIET) 801 MG TABS Take 801 mg by mouth 3 (three) times daily. 90 tablet 11   No current facility-administered medications for this encounter.     Allergies  Allergen Reactions  . Cefdinir Diarrhea  . Erythromycin Base Diarrhea    Other  . Lactose     Diarrhea    . Lactose Intolerance (Gi) Diarrhea  . Lactulose Diarrhea  . Mesalamine Nausea Only  . Nitrofuran Derivatives     shakiness  . Nitrofurantoin Other (See Comments)    shakiness other  . Other Diarrhea and Other (See Comments)    Shaking uncontrollably "lettuce only" "  lettuce only"  . Sulfa Antibiotics Other (See Comments)    Patient can't recall reaction   . Tdap [Tetanus-Diphth-Acell Pertussis]     Shaking uncontrollably  . Tetanus Toxoid, Adsorbed Other (See Comments)    Shaking uncontrollable       Social History   Socioeconomic History  . Marital status: Married    Spouse name: Not on file  . Number of children: 2  . Years of education: Not on file  . Highest education level: Not on file  Occupational History  . Not on file  Tobacco Use  . Smoking status: Never Smoker  . Smokeless tobacco: Never Used  . Tobacco comment: positive passive tobacco smoke exposure  Substance and Sexual Activity  . Alcohol use: Yes    Comment: glass of wine each night   . Drug use: No  . Sexual activity: Yes  Other Topics Concern  . Not on file  Social History Narrative   Exercise - at least 2 times weekly   Caffeine - 2 cups in the morning         Social Determinants of Health   Financial Resource Strain:   . Difficulty of Paying Living Expenses:   Food Insecurity:   . Worried About Charity fundraiser in the Last Year:   . Arboriculturist in the Last Year:   Transportation Needs:   . Film/video editor (Medical):   Marland Kitchen Lack of Transportation (Non-Medical):   Physical Activity:   . Days of Exercise per Week:   . Minutes of Exercise per Session:   Stress:   . Feeling of Stress :   Social Connections:   . Frequency of Communication with Friends and Family:   . Frequency of Social Gatherings with Friends and Family:   . Attends Religious  Services:   . Active Member of Clubs or Organizations:   . Attends Archivist Meetings:   Marland Kitchen Marital Status:   Intimate Partner Violence:   . Fear of Current or Ex-Partner:   . Emotionally Abused:   Marland Kitchen Physically Abused:   . Sexually Abused:       Family History  Problem Relation Age of Onset  . Heart disease Mother   . Arthritis Mother   . Heart attack Father   . Diabetes Other        sibling  . Heart attack Other        sibling    Vitals:   11/08/19 1141  BP: 118/76  Pulse: 84  SpO2: 97%  Weight: 92.1 kg (203 lb)  Height: _0  (1.549 m)    PHYSICAL EXAM: General:  Obese woman wearing O2. No respiratory difficulty at rest HEENT: normal Neck: supple. no JVD. Carotids 2+ bilat; no bruits. No lymphadenopathy or thryomegaly appreciated. Cor: PMI nondisplaced. Regular rate & rhythm. No rubs, gallops or murmurs. Lungs: clear fine crackles with decreased BS throoughtout Abdomen: obese soft, nontender, nondistended. No hepatosplenomegaly. No bruits or masses. Good bowel sounds. Extremities: no cyanosis, clubbing, rash, edema diffuse arthritic changes Neuro: alert & oriented x 3, cranial nerves grossly intact. moves all 4 extremities w/o difficulty. Affect pleasant.  ECG: NSR 79 LAFB. PRWP Personally reviewed    ASSESSMENT & PLAN:  1. Dyspnea - clearly multifactorial - I have reviewed chest CT, PFTs, recent cath and previous echo - suspect major drivers are severe pulmonary fibrosis and obesity - previous RHC showed minimal PAH despite obesity and RA - will repeat echo. If evidence  of worsening PAH or RV strain will repeat RHC otherwise continue current management - stressed need for weight loss with [employer] but she states she needs to eat frequently due to IBS  2. Pulmonary fibrosis in setting of RA - followed by [name]  3. CAD, non obstructive - continue to follow with Dr. Saunders Revel  4. Obesity - stressed need for weight loss  5. Chronic  hypoxic respiratory failure  - continue O2  Will see back as needed based on echo   Glori Bickers, MD  3:37 PM

## 2019-11-08 NOTE — Patient Instructions (Addendum)
Your physician has requested that you have an echocardiogram. Echocardiography is a painless test that uses sound waves to create images of your heart. It provides your doctor with information about the size and shape of your heart and how well your heart's chambers and valves are working. This procedure takes approximately one hour. There are no restrictions for this procedure.  Keep follow up as scheduled with Dr Saunders Revel

## 2019-11-11 ENCOUNTER — Other Ambulatory Visit: Payer: Self-pay

## 2019-11-11 ENCOUNTER — Encounter (HOSPITAL_COMMUNITY)
Admission: RE | Admit: 2019-11-11 | Discharge: 2019-11-11 | Disposition: A | Discharge status: Home / Self Care | Payer: Medicare Other | Source: Ambulatory Visit | Attending: Internal Medicine | Admitting: Internal Medicine

## 2019-11-11 VITALS — BP 120/70 | HR 77 | Temp 97.3°F | Ht 61.0 in | Wt 204.1 lb

## 2019-11-11 DIAGNOSIS — J849 Interstitial pulmonary disease, unspecified: Secondary | ICD-10-CM | POA: Insufficient documentation

## 2019-11-11 NOTE — Progress Notes (Signed)
[name] 74 y.o. female Pulmonary Rehab Orientation Note Patient arrived today in Cardiac and Pulmonary Rehab for orientation to Pulmonary Rehab. She was dropped off @ the [facility] entrance by her husband, she uses a rollator and carries a portable oxygen concentrator.  She has severe rheumatoid arthritis and has had bilateral knee replacements, and right hip replacement.  She uses a rollator for balance issues and to assist her in carrying her portable oxygen concentrator.   Per pt, she uses oxygen continuously. Color good, skin warm and dry. Patient is oriented to time and place. Patient's medical history, psychosocial health, and medications reviewed. Psychosocial assessment reveals pt lives with their spouse. Pt is currently retired. Pt hobbies include mah jongg which she plays with friends on the computer. She used to be an Glass blower/designer, but her interstitial lung disease, rheumatoid arthritis, diastolic heart failure, and orthopedic problems hamper her from that activity.  Pt reports her stress level is high. Areas of stress/anxiety include Health.  Pt does exhibits signs of depression. Signs of depression include sadness and  PHQ2/9 score 0/3. Pt shows fair  coping skills with positive outlook .  offered emotional support and reassurance. Will continue to monitor and evaluate progress toward psychosocial goal(s) of being able to handle her stress with healthy habits. Physical assessment reveals heart rate is normal, breath sounds clear to auscultation, no wheezes, rales, or rhonchi. Grip strength equal, strong. Patient reports she does take medications as prescribed. Patient states she follows a low sodium and IBS diet diet. The patient reports no specific efforts to gain or lose weight.. Patient's weight will be monitored closely. Demonstration and practice of PLB using pulse oximeter. Patient able to return demonstration satisfactorily. Safety and hand hygiene in the exercise area reviewed with patient.  Patient voices understanding of the information reviewed. Department expectations discussed with patient and achievable goals were set. The patient shows enthusiasm about attending the program and we look forward to working with this nice lady. The patient completed a 6 min walk test today and to begin exercise on Tuesday, Nov 19, 2019 in the 10:15 am exercise slot.  [id number]

## 2019-11-11 NOTE — Progress Notes (Signed)
Pulmonary Individual Treatment Plan  Patient Details  Name: [name] MRN: [id number] Date of Birth: 07-24-1945 Referring Provider:     Pulmonary Rehab Walk Test from 11/11/2019 in [place]  Referring Provider  [name]      Initial Encounter Date:    Pulmonary Rehab Walk Test from 11/11/2019 in [place]  Date  11/11/19      Visit Diagnosis: Interstitial lung disease ([place])  Patient's Home Medications on Admission:   Current Outpatient Medications:  .  albuterol (VENTOLIN HFA) 108 (90 Base) MCG/ACT inhaler, USE 2 PUFFS BY MOUTH EVERY  6 HOURS AS NEEDED, Disp: 34 g, Rfl: 2 .  aspirin EC 81 MG tablet, Take 81 mg by mouth daily., Disp: , Rfl:  .  benzonatate (TESSALON) 200 MG capsule, TAKE 1 CAPSULE BY MOUTH 3 TIMES A DAY AS NEEDED FOR COUGH (Patient taking differently: Take 200 mg by mouth 3 (three) times daily as needed for cough. TAKE 1 CAPSULE BY MOUTH 3 TIMES A DAY AS NEEDED FOR COUGH), Disp: 30 capsule, Rfl: 3 .  cetirizine (ZYRTEC) 10 MG tablet, Take 10 mg by mouth daily., Disp: , Rfl:  .  clidinium-chlordiazePOXIDE (LIBRAX) 5-2.5 MG capsule, Take 1 capsule by mouth 2 (two) times daily as needed (for IBS)., Disp: , Rfl:  .  colestipol (COLESTID) 1 G tablet, Take 1 g by mouth 2 (two) times daily. , Disp: , Rfl:  .  dicyclomine (BENTYL) 20 MG tablet, Take 20 mg by mouth 4 (four) times daily -  before meals and at bedtime., Disp: , Rfl:  .  FLUoxetine (PROZAC) 20 MG capsule, Take 20 mg by mouth daily., Disp: , Rfl:  .  fluticasone (FLONASE) 50 MCG/ACT nasal spray, Place 2 sprays into both nostrils daily., Disp: 11.1 mL, Rfl: 6 .  Fluticasone-Umeclidin-Vilant (TRELEGY ELLIPTA) 100-62.5-25 MCG/INH AEPB, Inhale 1 puff into the lungs daily., Disp: 3 each, Rfl: 4 .  furosemide (LASIX) 40 MG tablet, TAKE 1 TABLET BY MOUTH  TWICE DAILY, Disp: 180 tablet, Rfl: 3 .  glycopyrrolate (ROBINUL) 1 MG tablet, Take 1 mg by  mouth 3 (three) times daily as needed (for IBS). , Disp: , Rfl:  .  hydroxychloroquine (PLAQUENIL) 200 MG tablet, Take 2 tablets (400 mg total) by mouth daily. Stop for 2 weeks, post-op., Disp: , Rfl:  .  ipratropium (ATROVENT) 0.03 % nasal spray, USE 1 TO 2 SPRAYS INTO BOTH NOSTRILS 2 TIMES DAILY, Disp: 90 mL, Rfl: 1 .  ipratropium-albuterol (DUONEB) 0.5-2.5 (3) MG/3ML SOLN, USE 1 VIAL IN NEBULIZER EVERY 6 HOURS AS NEEDED *J45.901* npi [id number], Disp: 270 mL, Rfl: 11 .  leflunomide (ARAVA) 20 MG tablet, Take 20 mg by mouth daily. , Disp: , Rfl:  .  methocarbamol (ROBAXIN) 500 MG tablet, Take 1 tablet (500 mg total) by mouth every 6 (six) hours as needed for muscle spasms., Disp: 40 tablet, Rfl: 0 .  mirtazapine (REMERON) 45 MG tablet, Take 45 mg by mouth at bedtime., Disp: , Rfl:  .  montelukast (SINGULAIR) 10 MG tablet, TAKE 1 TABLET BY MOUTH AT  BEDTIME, Disp: 90 tablet, Rfl: 3 .  Multiple Vitamin (MULTIVITAMIN) tablet, Take 1 tablet by mouth daily., Disp: , Rfl:  .  omeprazole (PRILOSEC) 40 MG capsule, Take 40 mg by mouth at bedtime. , Disp: , Rfl:  .  OXYGEN, Inhale 4 mLs into the lungs continuous. , Disp: , Rfl:  .  Pirfenidone (ESBRIET) 801 MG TABS,  Take 801 mg by mouth 3 (three) times daily., Disp: 90 tablet, Rfl: 11 .  potassium chloride SA (KLOR-CON) 20 MEQ tablet, Take 1 tablet (20 mEq total) by mouth 2 (two) times daily., Disp: 180 tablet, Rfl: 3 .  predniSONE (DELTASONE) 5 MG tablet, Take 5 mg by mouth daily with breakfast., Disp: , Rfl:  .  rosuvastatin (CRESTOR) 20 MG tablet, Take 1 tablet (20 mg total) by mouth daily., Disp: 90 tablet, Rfl: 3 .  verapamil (CALAN-SR) 240 MG CR tablet, TAKE 1 TABLET BY MOUTH  DAILY, Disp: 90 tablet, Rfl: 3  Past Medical History: Past Medical History:  Diagnosis Date  . Acute asthmatic bronchitis   . Allergic rhinitis   . Anxiety   . Arthritis   . Esophageal reflux   . Hypertension   . Interstitial lung disease ([place]) dx jan 2020  . Irritable  bowel syndrome   . Oxygen dependent    4 liters day time 6 liters at night  . PONV (postoperative nausea and vomiting)    ponv likes zofran, and scopolamine patch  . Rheumatoid arthritis(714.0)   . Sleep apnea     Tobacco Use: Social History   Tobacco Use  Smoking Status Never Smoker  Smokeless Tobacco Never Used  Tobacco Comment   positive passive tobacco smoke exposure    Labs: Recent Review Flowsheet Data    Labs for ITP Cardiac and Pulmonary Rehab Latest Ref Rng & Units 11/15/2012 09/16/2016 09/16/2016 09/16/2016 01/09/2017   Cholestrol 100 - 199 mg/dL - - - - 127   LDLCALC 0 - 99 mg/dL - - - - 38   LDLDIRECT 0 - 99 mg/dL - - - - 53   HDL >39 mg/dL - - - - 53   Trlycerides 0 - 149 mg/dL - - - - 181(H)   Hemoglobin A1c 4.8 - 5.6 % 6.0(H) - - - -   PHART 7.350 - 7.450 - - 7.366 - -   PCO2ART 32.0 - 48.0 mmHg - - 38.3 - -   HCO3 20.0 - 28.0 mmol/L - 23.7 21.9 23.3 -   TCO2 0 - 100 mmol/L - _0 -   ACIDBASEDEF 0.0 - 2.0 mmol/L - 1.0 3.0(H) 2.0 -   O2SAT % - 70.0 94.0 75.0 -      Capillary Blood Glucose: Lab Results  Component Value Date   GLUCAP 87 08/01/2013   GLUCAP 81 08/01/2013   GLUCAP 156 (H) 09/11/2010     Pulmonary Assessment Scores: Pulmonary Assessment Scores    Row Name 11/11/19 1102         ADL UCSD   ADL Phase  Entry     SOB Score total  55       CAT Score   CAT Score  17       UCSD: Self-administered rating of dyspnea associated with activities of daily living (ADLs) 6-point scale (0 = "not at all" to 5 = "maximal or unable to do because of breathlessness")  Scoring Scores range from 0 to 120.  Minimally important difference is 5 units  CAT: CAT can identify the health impairment of COPD patients and is better correlated with disease progression.  CAT has a scoring range of zero to 40. The CAT score is classified into four groups of low (less than 10), medium (10 - 20), high (21-30) and very high (31-40) based on the impact level of disease  on health status. A CAT score over 10 suggests significant symptoms.  A worsening CAT score could be explained by an exacerbation, poor medication adherence, poor inhaler technique, or progression of COPD or comorbid conditions.  CAT MCID is 2 points  mMRC: mMRC (Modified Medical Research Council) Dyspnea Scale is used to assess the degree of baseline functional disability in patients of respiratory disease due to dyspnea. No minimal important difference is established. A decrease in score of 1 point or greater is considered a positive change.   Pulmonary Function Assessment: Pulmonary Function Assessment - 11/11/19 1100      Breath   Bilateral Breath Sounds  Clear    Shortness of Breath  Yes;Limiting activity;Fear of Shortness of Breath       Exercise Target Goals: Exercise Program Goal: Individual exercise prescription set using results from initial 6 min walk test and THRR while considering  patient's activity barriers and safety.   Exercise Prescription Goal: Initial exercise prescription builds to 30-45 minutes a day of aerobic activity, 2-3 days per week.  Home exercise guidelines will be given to patient during program as part of exercise prescription that the participant will acknowledge.  Activity Barriers & Risk Stratification: Activity Barriers & Cardiac Risk Stratification - 11/11/19 1052      Activity Barriers & Cardiac Risk Stratification   Activity Barriers  Arthritis;Right Hip Replacement;Left Knee Replacement;Right Knee Replacement;Joint Problems;Deconditioning;Muscular Weakness;Shortness of Breath;History of Falls;Assistive Device       6 Minute Walk: 6 Minute Walk    Row Name 11/11/19 1209         6 Minute Walk   Distance  658 feet     Walk Time  5.5 minutes     # of Rest Breaks  1     MPH  1.24     METS  1.29     RPE  15     Perceived Dyspnea   2     VO2 Peak  4.5     Symptoms  Yes (comment)     Comments  used wheelchair. One seated rest break for 30  sec due to SOB     Resting HR  78 bpm     Resting BP  120/70     Resting Oxygen Saturation   97 %     Exercise Oxygen Saturation  during 6 min walk  90 %     Max Ex. HR  131 bpm     Max Ex. BP  144/88     2 Minute Post BP  124/80       Interval HR   1 Minute HR  99     2 Minute HR  112     3 Minute HR  120     4 Minute HR  128     5 Minute HR  125     6 Minute HR  131     2 Minute Post HR  111     Interval Heart Rate?  Yes       Interval Oxygen   Interval Oxygen?  Yes     Baseline Oxygen Saturation %  97 %     1 Minute Oxygen Saturation %  94 %     1 Minute Liters of Oxygen  4 L     2 Minute Oxygen Saturation %  93 %     2 Minute Liters of Oxygen  4 L     3 Minute Oxygen Saturation %  93 %     3 Minute Liters of Oxygen  4 L     4 Minute Oxygen Saturation %  92 %     4 Minute Liters of Oxygen  4 L     5 Minute Oxygen Saturation %  92 %     5 Minute Liters of Oxygen  4 L     6 Minute Oxygen Saturation %  90 %     6 Minute Liters of Oxygen  4 L     2 Minute Post Oxygen Saturation %  97 %     2 Minute Post Liters of Oxygen  4 L        Oxygen Initial Assessment: Oxygen Initial Assessment - 11/11/19 1209      Home Oxygen   Home Oxygen Device  Home Concentrator;Portable Concentrator    Sleep Oxygen Prescription  Continuous    Liters per minute  4    Home Exercise Oxygen Prescription  Continuous    Liters per minute  4    Home at Rest Exercise Oxygen Prescription  Continuous    Liters per minute  4    Compliance with Home Oxygen Use  Yes      Initial 6 min Walk   Oxygen Used  Continuous    Liters per minute  4      Program Oxygen Prescription   Program Oxygen Prescription  Continuous    Liters per minute  4      Intervention   Short Term Goals  To learn and exhibit compliance with exercise, home and travel O2 prescription;To learn and understand importance of monitoring SPO2 with pulse oximeter and demonstrate accurate use of the pulse oximeter.;To learn and  understand importance of maintaining oxygen saturations>88%;To learn and demonstrate proper pursed lip breathing techniques or other breathing techniques.;To learn and demonstrate proper use of respiratory medications    Long  Term Goals  Exhibits compliance with exercise, home and travel O2 prescription;Verbalizes importance of monitoring SPO2 with pulse oximeter and return demonstration;Maintenance of O2 saturations>88%;Compliance with respiratory medication;Exhibits proper breathing techniques, such as pursed lip breathing or other method taught during program session;Demonstrates proper use of MDI's       Oxygen Re-Evaluation:   Oxygen Discharge (Final Oxygen Re-Evaluation):   Initial Exercise Prescription: Initial Exercise Prescription - 11/11/19 1200      Date of Initial Exercise RX and Referring Provider   Date  11/11/19    Referring Provider  [name]      Oxygen   Oxygen  Continuous    Liters  4      NuStep   Level  2    SPM  80    Minutes  30      Prescription Details   Frequency (times per week)  2    Duration  Progress to 30 minutes of continuous aerobic without signs/symptoms of physical distress      Intensity   THRR 40-80% of Max Heartrate  62-124    Ratings of Perceived Exertion  11-13    Perceived Dyspnea  0-4      Progression   Progression  Continue to progress workloads to maintain intensity without signs/symptoms of physical distress.      Resistance Training   Training Prescription  Yes    Weight  orange bands    Reps  10-15       Perform Capillary Blood Glucose checks as needed.  Exercise Prescription Changes:   Exercise Comments:   Exercise Goals and Review: Exercise Goals    Row Name  11/11/19 1213             Exercise Goals   Increase Physical Activity  Yes       Intervention  Provide advice, education, support and counseling about physical activity/exercise needs.;Develop an individualized exercise prescription for  aerobic and resistive training based on initial evaluation findings, risk stratification, comorbidities and participant's personal goals.       Expected Outcomes  Short Term: Attend rehab on a regular basis to increase amount of physical activity.;Long Term: Add in home exercise to make exercise part of routine and to increase amount of physical activity.;Long Term: Exercising regularly at least 3-5 days a week.       Increase Strength and Stamina  Yes       Intervention  Provide advice, education, support and counseling about physical activity/exercise needs.;Develop an individualized exercise prescription for aerobic and resistive training based on initial evaluation findings, risk stratification, comorbidities and participant's personal goals.       Expected Outcomes  Short Term: Increase workloads from initial exercise prescription for resistance, speed, and METs.;Long Term: Improve cardiorespiratory fitness, muscular endurance and strength as measured by increased METs and functional capacity (6MWT);Short Term: Perform resistance training exercises routinely during rehab and add in resistance training at home       Able to understand and use rate of perceived exertion (RPE) scale  Yes       Intervention  Provide education and explanation on how to use RPE scale       Expected Outcomes  Short Term: Able to use RPE daily in rehab to express subjective intensity level;Long Term:  Able to use RPE to guide intensity level when exercising independently       Able to understand and use Dyspnea scale  Yes       Intervention  Provide education and explanation on how to use Dyspnea scale       Expected Outcomes  Short Term: Able to use Dyspnea scale daily in rehab to express subjective sense of shortness of breath during exertion;Long Term: Able to use Dyspnea scale to guide intensity level when exercising independently       Knowledge and understanding of Target Heart Rate Range (THRR)  Yes        Intervention  Provide education and explanation of THRR including how the numbers were predicted and where they are located for reference       Expected Outcomes  Short Term: Able to state/look up THRR;Short Term: Able to use daily as guideline for intensity in rehab;Long Term: Able to use THRR to govern intensity when exercising independently       Understanding of Exercise Prescription  Yes       Intervention  Provide education, explanation, and written materials on patient's individual exercise prescription       Expected Outcomes  Short Term: Able to explain program exercise prescription;Long Term: Able to explain home exercise prescription to exercise independently          Exercise Goals Re-Evaluation :   Discharge Exercise Prescription (Final Exercise Prescription Changes):   Nutrition:  Target Goals: Understanding of nutrition guidelines, daily intake of sodium <1573m, cholesterol <2057m calories 30% from fat and 7% or less from saturated fats, daily to have 5 or more servings of fruits and vegetables.  Biometrics: Pre Biometrics - 11/11/19 1053      Pre Biometrics   Grip Strength  15 kg        Nutrition Therapy Plan and  Nutrition Goals:   Nutrition Assessments:   Nutrition Goals Re-Evaluation:   Nutrition Goals Discharge (Final Nutrition Goals Re-Evaluation):   Psychosocial: Target Goals: Acknowledge presence or absence of significant depression and/or stress, maximize coping skills, provide positive support system. Participant is able to verbalize types and ability to use techniques and skills needed for reducing stress and depression.  Initial Review & Psychosocial Screening: Initial Psych Review & Screening - 11/11/19 1104      Initial Review   Current issues with  History of Depression;Current Depression;Current Stress Concerns    Source of Stress Concerns  Chronic Illness;Unable to participate in former interests or hobbies;Unable to perform  yard/household activities      [place]?  Yes      Barriers   Psychosocial barriers to participate in program  The patient should benefit from training in stress management and relaxation.      Screening Interventions   Interventions  Encouraged to exercise       Quality of Life Scores:  Scores of 19 and below usually indicate a poorer quality of life in these areas.  A difference of  2-3 points is a clinically meaningful difference.  A difference of 2-3 points in the total score of the Quality of Life Index has been associated with significant improvement in overall quality of life, self-image, physical symptoms, and general health in studies assessing change in quality of life.  PHQ-9: Recent Review Flowsheet Data    Depression screen [facility] 2/9 11/11/2019 09/10/2018 05/09/2018   Decreased Interest 0 0 0   Down, Depressed, Hopeless 0 1 0   PHQ - 2 Score 0 1 0   Altered sleeping 0 3 -   Tired, decreased energy 3 3 -   Change in appetite 0 0 -   Feeling bad or failure about yourself  0 1 -   Trouble concentrating 0 0 -   Moving slowly or fidgety/restless 0 0 -   Suicidal thoughts 0 0 -   Difficult doing work/chores Somewhat difficult Somewhat difficult -     Interpretation of Total Score  Total Score Depression Severity:  1-4 = Minimal depression, 5-9 = Mild depression, 10-14 = Moderate depression, 15-19 = Moderately severe depression, 20-27 = Severe depression   Psychosocial Evaluation and Intervention: Psychosocial Evaluation - 11/11/19 1105      Psychosocial Evaluation & Interventions   Interventions  Stress management education;Relaxation education;Encouraged to exercise with the program and follow exercise prescription    Comments  Depression due to unable to participate in activities she used to do    Expected Outcomes  To handle her depression in healthy manners.    Continue Psychosocial Services   Follow up required by staff        Psychosocial Re-Evaluation:   Psychosocial Discharge (Final Psychosocial Re-Evaluation):   Education: Education Goals: Education classes will be provided on a weekly basis, covering required topics. Participant will state understanding/return demonstration of topics presented.  Learning Barriers/Preferences: Learning Barriers/Preferences - 11/11/19 1107      Learning Barriers/Preferences   Learning Barriers  None    Learning Preferences  Audio;Computer/Internet;Skilled Demonstration;Pictoral;Verbal Instruction;Video;Written Material       Education Topics: Risk Factor Reduction:  -Group instruction that is supported by a PowerPoint presentation. Instructor discusses the definition of a risk factor, different risk factors for pulmonary disease, and how the heart and lungs work together.     Nutrition for Pulmonary Patient:  -Group instruction provided by PowerPoint  slides, verbal discussion, and written materials to support subject matter. The instructor gives an explanation and review of healthy diet recommendations, which includes a discussion on weight management, recommendations for fruit and vegetable consumption, as well as protein, fluid, caffeine, fiber, sodium, sugar, and alcohol. Tips for eating when patients are short of breath are discussed.   Pursed Lip Breathing:  -Group instruction that is supported by demonstration and informational handouts. Instructor discusses the benefits of pursed lip and diaphragmatic breathing and detailed demonstration on how to preform both.     Oxygen Safety:  -Group instruction provided by PowerPoint, verbal discussion, and written material to support subject matter. There is an overview of "What is Oxygen" and "Why do we need it".  Instructor also reviews how to create a safe environment for oxygen use, the importance of using oxygen as prescribed, and the risks of noncompliance. There is a brief discussion on traveling with oxygen and  resources the patient may utilize.   Oxygen Equipment:  -Group instruction provided by [facility] Staff utilizing handouts, written materials, and equipment demonstrations.   Signs and Symptoms:  -Group instruction provided by written material and verbal discussion to support subject matter. Warning signs and symptoms of infection, stroke, and heart attack are reviewed and when to call the physician/911 reinforced. Tips for preventing the spread of infection discussed.   Advanced Directives:  -Group instruction provided by verbal instruction and written material to support subject matter. Instructor reviews Advanced Directive laws and proper instruction for filling out document.   Pulmonary Video:  -Group video education that reviews the importance of medication and oxygen compliance, exercise, good nutrition, pulmonary hygiene, and pursed lip and diaphragmatic breathing for the pulmonary patient.   Exercise for the Pulmonary Patient:  -Group instruction that is supported by a PowerPoint presentation. Instructor discusses benefits of exercise, core components of exercise, frequency, duration, and intensity of an exercise routine, importance of utilizing pulse oximetry during exercise, safety while exercising, and options of places to exercise outside of rehab.     Pulmonary Medications:  -Verbally interactive group education provided by instructor with focus on inhaled medications and proper administration.   Anatomy and Physiology of the Respiratory System and Intimacy:  -Group instruction provided by PowerPoint, verbal discussion, and written material to support subject matter. Instructor reviews respiratory cycle and anatomical components of the respiratory system and their functions. Instructor also reviews differences in obstructive and restrictive respiratory diseases with examples of each. Intimacy, Sex, and Sexuality differences are reviewed with a discussion on how relationships  can change when diagnosed with pulmonary disease. Common sexual concerns are reviewed.   MD DAY -A group question and answer session with a medical doctor that allows participants to ask questions that relate to their pulmonary disease state.   OTHER EDUCATION -Group or individual verbal, written, or video instructions that support the educational goals of the pulmonary rehab program.   Holiday Eating Survival Tips:  -Group instruction provided by PowerPoint slides, verbal discussion, and written materials to support subject matter. The instructor gives patients tips, tricks, and techniques to help them not only survive but enjoy the holidays despite the onslaught of food that accompanies the holidays.   Knowledge Questionnaire Score: Knowledge Questionnaire Score - 11/11/19 1206      Knowledge Questionnaire Score   Pre Score  14/18       Core Components/Risk Factors/Patient Goals at Admission: Personal Goals and Risk Factors at Admission - 11/11/19 1107      Core Components/Risk  Factors/Patient Goals on Admission    Weight Management  Obesity    Improve shortness of breath with ADL's  Yes    Intervention  Provide education, individualized exercise plan and daily activity instruction to help decrease symptoms of SOB with activities of daily living.    Expected Outcomes  Short Term: Improve cardiorespiratory fitness to achieve a reduction of symptoms when performing ADLs;Long Term: Be able to perform more ADLs without symptoms or delay the onset of symptoms    Stress  Yes    Intervention  Offer individual and/or small group education and counseling on adjustment to heart disease, stress management and health-related lifestyle change. Teach and support self-help strategies.;Refer participants experiencing significant psychosocial distress to appropriate mental health specialists for further evaluation and treatment. When possible, include family members and significant others in  education/counseling sessions.    Expected Outcomes  Short Term: Participant demonstrates changes in health-related behavior, relaxation and other stress management skills, ability to obtain effective social support, and compliance with psychotropic medications if prescribed.;Long Term: Emotional wellbeing is indicated by absence of clinically significant psychosocial distress or social isolation.       Core Components/Risk Factors/Patient Goals Review:  Goals and Risk Factor Review    Row Name 11/11/19 1109             Core Components/Risk Factors/Patient Goals Review   Personal Goals Review  Develop more efficient breathing techniques such as purse lipped breathing and diaphragmatic breathing and practicing self-pacing with activity.;Increase knowledge of respiratory medications and ability to use respiratory devices properly.;Improve shortness of breath with ADL's;Stress;Weight Management/Obesity          Core Components/Risk Factors/Patient Goals at Discharge (Final Review):  Goals and Risk Factor Review - 11/11/19 1109      Core Components/Risk Factors/Patient Goals Review   Personal Goals Review  Develop more efficient breathing techniques such as purse lipped breathing and diaphragmatic breathing and practicing self-pacing with activity.;Increase knowledge of respiratory medications and ability to use respiratory devices properly.;Improve shortness of breath with ADL's;Stress;Weight Management/Obesity       ITP Comments:   Comments:

## 2019-11-15 ENCOUNTER — Other Ambulatory Visit: Payer: Self-pay

## 2019-11-15 ENCOUNTER — Ambulatory Visit (HOSPITAL_COMMUNITY)
Admission: RE | Admit: 2019-11-15 | Discharge: 2019-11-15 | Disposition: A | Discharge status: Home / Self Care | Payer: Medicare Other | Source: Ambulatory Visit | Attending: Internal Medicine | Admitting: Internal Medicine

## 2019-11-15 DIAGNOSIS — I5032 Chronic diastolic (congestive) heart failure: Secondary | ICD-10-CM | POA: Insufficient documentation

## 2019-11-15 NOTE — Progress Notes (Signed)
  Echocardiogram 2D Echocardiogram has been performed.  [name] 11/15/2019, 3:52 PM

## 2019-11-19 ENCOUNTER — Other Ambulatory Visit: Payer: Self-pay

## 2019-11-19 ENCOUNTER — Encounter (HOSPITAL_COMMUNITY)
Admission: RE | Admit: 2019-11-19 | Discharge: 2019-11-19 | Disposition: A | Discharge status: Home / Self Care | Payer: Medicare Other | Source: Ambulatory Visit | Attending: Internal Medicine | Admitting: Internal Medicine

## 2019-11-19 VITALS — Wt 204.4 lb

## 2019-11-19 DIAGNOSIS — J849 Interstitial pulmonary disease, unspecified: Secondary | ICD-10-CM | POA: Diagnosis not present

## 2019-11-19 NOTE — Progress Notes (Signed)
Daily Session Note  Patient Details  Name: [name] MRN: [id number] Date of Birth: 1946/03/07 Referring Provider:     Pulmonary Rehab Walk Test from 11/11/2019 in [place]  Referring Provider  [name]      Encounter Date: 11/19/2019  Check In: Session Check In - 11/19/19 1043      Check-In   Supervising physician immediately available to respond to emergencies  [facility] immediately available    Physician(s)  Dr. Philis Pique    Location  [facility]    Staff Present  Rosebud Poles, RN, Bjorn Loser, MS, Exercise Physiologist;Valery Amedee Ysidro Evert, RN    Virtual Visit  No    Medication changes reported      No    Fall or balance concerns reported     No    Tobacco Cessation  No Change    Warm-up and Cool-down  Performed as group-led instruction    Resistance Training Performed  Yes    VAD Patient?  No    PAD/SET Patient?  No      Pain Assessment   Currently in Pain?  No/denies    Multiple Pain Sites  No       Capillary Blood Glucose: No results found for this or any previous visit (from the past 24 hour(s)).  Exercise Prescription Changes - 11/19/19 1100      Response to Exercise   Blood Pressure (Admit)  140/80    Blood Pressure (Exercise)  134/78    Blood Pressure (Exit)  126/64    Heart Rate (Admit)  79 bpm    Heart Rate (Exercise)  94 bpm    Heart Rate (Exit)  88 bpm    Oxygen Saturation (Admit)  98 %    Oxygen Saturation (Exercise)  98 %    Oxygen Saturation (Exit)  98 %    Rating of Perceived Exertion (Exercise)  12    Perceived Dyspnea (Exercise)  0    Duration  Progress to 30 minutes of  aerobic without signs/symptoms of physical distress    Intensity  Other (comment)   40-80 % of HRR     Progression   Progression  Continue to progress workloads to maintain intensity without signs/symptoms of physical distress.      Resistance Training   Training Prescription  Yes    Weight  orange bands     Reps  10-15    Time  10 Minutes      Oxygen   Oxygen  Continuous    Liters  4      NuStep   Level  2    SPM  80    Minutes  30    METs  1.6       Social History   Tobacco Use  Smoking Status Never Smoker  Smokeless Tobacco Never Used  Tobacco Comment   positive passive tobacco smoke exposure    Goals Met:  Exercise tolerated well No report of cardiac concerns or symptoms Strength training completed today  Goals Unmet:  Not Applicable  Comments: Service time is from 1000 to 1105    Dr. Fransico Him is Medical Director for Cardiac Rehab at [facility].

## 2019-11-20 ENCOUNTER — Telehealth (HOSPITAL_COMMUNITY): Payer: Self-pay | Admitting: *Deleted

## 2019-11-20 NOTE — Telephone Encounter (Signed)
-----  Message from Jolaine Artist, MD sent at 11/17/2019 11:53 AM EDT ----- Echo normal. Normal LV and RV. No evidence of PAH.

## 2019-11-20 NOTE — Telephone Encounter (Signed)
Echo results  EF 55-60%   Harvie Junior, [place]  11/20/2019 9:21 AM EDT    Pt aware of results.    Bensimhon, Shaune Pascal, MD  11/17/2019 11:53 AM EDT    Echo normal. Normal LV and RV. No evidence of PAH.

## 2019-11-21 ENCOUNTER — Encounter (HOSPITAL_COMMUNITY)
Admission: RE | Admit: 2019-11-21 | Discharge: 2019-11-21 | Disposition: A | Discharge status: Home / Self Care | Payer: Medicare Other | Source: Ambulatory Visit | Attending: Internal Medicine | Admitting: Internal Medicine

## 2019-11-21 ENCOUNTER — Other Ambulatory Visit: Payer: Self-pay

## 2019-11-21 DIAGNOSIS — J849 Interstitial pulmonary disease, unspecified: Secondary | ICD-10-CM

## 2019-11-21 NOTE — Progress Notes (Signed)
[name] 74 y.o. female Nutrition Note  Visit Diagnosis: Interstitial lung disease [facility])  Past Medical History:  Diagnosis Date  . Acute asthmatic bronchitis   . Allergic rhinitis   . Anxiety   . Arthritis   . Esophageal reflux   . Hypertension   . Interstitial lung disease ([place]) dx jan 2020  . Irritable bowel syndrome   . Oxygen dependent    4 liters day time 6 liters at night  . PONV (postoperative nausea and vomiting)    ponv likes zofran, and scopolamine patch  . Rheumatoid arthritis(714.0)   . Sleep apnea      Medications reviewed.   Current Outpatient Medications:  .  albuterol (VENTOLIN HFA) 108 (90 Base) MCG/ACT inhaler, USE 2 PUFFS BY MOUTH EVERY  6 HOURS AS NEEDED, Disp: 34 g, Rfl: 2 .  aspirin EC 81 MG tablet, Take 81 mg by mouth daily., Disp: , Rfl:  .  benzonatate (TESSALON) 200 MG capsule, TAKE 1 CAPSULE BY MOUTH 3 TIMES A DAY AS NEEDED FOR COUGH (Patient taking differently: Take 200 mg by mouth 3 (three) times daily as needed for cough. TAKE 1 CAPSULE BY MOUTH 3 TIMES A DAY AS NEEDED FOR COUGH), Disp: 30 capsule, Rfl: 3 .  cetirizine (ZYRTEC) 10 MG tablet, Take 10 mg by mouth daily., Disp: , Rfl:  .  clidinium-chlordiazePOXIDE (LIBRAX) 5-2.5 MG capsule, Take 1 capsule by mouth 2 (two) times daily as needed (for IBS)., Disp: , Rfl:  .  colestipol (COLESTID) 1 G tablet, Take 1 g by mouth 2 (two) times daily. , Disp: , Rfl:  .  dicyclomine (BENTYL) 20 MG tablet, Take 20 mg by mouth 4 (four) times daily -  before meals and at bedtime., Disp: , Rfl:  .  FLUoxetine (PROZAC) 20 MG capsule, Take 20 mg by mouth daily., Disp: , Rfl:  .  fluticasone (FLONASE) 50 MCG/ACT nasal spray, Place 2 sprays into both nostrils daily., Disp: 11.1 mL, Rfl: 6 .  Fluticasone-Umeclidin-Vilant (TRELEGY ELLIPTA) 100-62.5-25 MCG/INH AEPB, Inhale 1 puff into the lungs daily., Disp: 3 each, Rfl: 4 .  furosemide (LASIX) 40 MG tablet, TAKE 1 TABLET BY MOUTH  TWICE DAILY, Disp: 180 tablet,  Rfl: 3 .  glycopyrrolate (ROBINUL) 1 MG tablet, Take 1 mg by mouth 3 (three) times daily as needed (for IBS). , Disp: , Rfl:  .  hydroxychloroquine (PLAQUENIL) 200 MG tablet, Take 2 tablets (400 mg total) by mouth daily. Stop for 2 weeks, post-op., Disp: , Rfl:  .  ipratropium (ATROVENT) 0.03 % nasal spray, USE 1 TO 2 SPRAYS INTO BOTH NOSTRILS 2 TIMES DAILY, Disp: 90 mL, Rfl: 1 .  ipratropium-albuterol (DUONEB) 0.5-2.5 (3) MG/3ML SOLN, USE 1 VIAL IN NEBULIZER EVERY 6 HOURS AS NEEDED *J45.901* npi [id number], Disp: 270 mL, Rfl: 11 .  leflunomide (ARAVA) 20 MG tablet, Take 20 mg by mouth daily. , Disp: , Rfl:  .  methocarbamol (ROBAXIN) 500 MG tablet, Take 1 tablet (500 mg total) by mouth every 6 (six) hours as needed for muscle spasms., Disp: 40 tablet, Rfl: 0 .  mirtazapine (REMERON) 45 MG tablet, Take 45 mg by mouth at bedtime., Disp: , Rfl:  .  montelukast (SINGULAIR) 10 MG tablet, TAKE 1 TABLET BY MOUTH AT  BEDTIME, Disp: 90 tablet, Rfl: 3 .  Multiple Vitamin (MULTIVITAMIN) tablet, Take 1 tablet by mouth daily., Disp: , Rfl:  .  omeprazole (PRILOSEC) 40 MG capsule, Take 40 mg by mouth at bedtime. , Disp: ,  Rfl:  .  OXYGEN, Inhale 4 mLs into the lungs continuous. , Disp: , Rfl:  .  Pirfenidone (ESBRIET) 801 MG TABS, Take 801 mg by mouth 3 (three) times daily., Disp: 90 tablet, Rfl: 11 .  potassium chloride SA (KLOR-CON) 20 MEQ tablet, Take 1 tablet (20 mEq total) by mouth 2 (two) times daily., Disp: 180 tablet, Rfl: 3 .  predniSONE (DELTASONE) 5 MG tablet, Take 5 mg by mouth daily with breakfast., Disp: , Rfl:  .  rosuvastatin (CRESTOR) 20 MG tablet, Take 1 tablet (20 mg total) by mouth daily., Disp: 90 tablet, Rfl: 3 .  verapamil (CALAN-SR) 240 MG CR tablet, TAKE 1 TABLET BY MOUTH  DAILY, Disp: 90 tablet, Rfl: 3   Ht Readings from Last 1 Encounters:  11/11/19 5' 1" (1.549 m)     Wt Readings from Last 3 Encounters:  11/19/19 204 lb 5.9 oz (92.7 kg)  11/11/19 204 lb 2.3 oz (92.6 kg)   11/08/19 203 lb (92.1 kg)     There is no height or weight on file to calculate BMI.   Social History   Tobacco Use  Smoking Status Never Smoker  Smokeless Tobacco Never Used  Tobacco Comment   positive passive tobacco smoke exposure     Nutrition Note  Spoke with pt. Nutrition Plan and Nutrition Survey goals reviewed with pt.   Pt has Pre-diabetes. Last A1c indicates blood glucose well-controlled. T  Per discussion, pt does use canned/convenience foods often. Pt does not add salt to food. Pt does eat out frequently. She eats out with her husband everyday for lunch.  She splits meal in half and takes it home for dinner.  She states her doctor and husband want her to lose weight. She lost about 30 lbs last year after  Hip surgery in June 2020. She reports poor appetite, feelings of depression, and sedentary behaviors. She was started on prednisone recently and gained 10 lbs. She feels disappointed in the weight gain. She wants to continue losing weight. She is trying to make healthier choices when eating out and choosing snacks.   Pt expressed understanding of the information reviewed.    Nutrition Diagnosis ? Obese  II = 35-39.9 related to excessive energy intake as evidenced by a BMI 38.61 kg/m2   Nutrition Intervention ? Pt's individual nutrition plan reviewed with pt. ? Benefits of adopting healthy diet reviewed with Rate My Plate survey ? Continue client-centered nutrition education by RD, as part of interdisciplinary care.  Goal(s) ? Pt to identify food quantities necessary to achieve weight loss of 6-24 lb at graduation from pulmonary rehab.  ? Pt to build a healthy plate including vegetables, fruits, whole grains, and low-fat dairy products in a heart healthy meal plan.  Plan:   Will provide client-centered nutrition education as part of interdisciplinary care  Monitor and evaluate progress toward nutrition goal with team.   Michaele Offer, MS, RDN, LDN

## 2019-11-21 NOTE — Progress Notes (Signed)
Daily Session Note  Patient Details  Name: [name] MRN: [id number] Date of Birth: 09/01/1945 Referring Provider:     Pulmonary Rehab Walk Test from 11/11/2019 in [facility]  Referring Provider  Dr. Ramaswamy      Encounter Date: 11/21/2019  Check In: Session Check In - 11/21/19 1012      Check-In   Supervising physician immediately available to respond to emergencies  [facility] immediately available    Physician(s)  Dr. Patel    Location  [facility]    Staff Present  Joan Behrens, RN, BSN;Dalton Fletcher, MS, Exercise Physiologist; , RN    Virtual Visit  No    Medication changes reported      No    Fall or balance concerns reported     No    Tobacco Cessation  No Change    Warm-up and Cool-down  Performed as group-led instruction    Resistance Training Performed  Yes    VAD Patient?  No    PAD/SET Patient?  No      Pain Assessment   Currently in Pain?  No/denies    Multiple Pain Sites  No       Capillary Blood Glucose: No results found for this or any previous visit (from the past 24 hour(s)).    Social History   Tobacco Use  Smoking Status Never Smoker  Smokeless Tobacco Never Used  Tobacco Comment   positive passive tobacco smoke exposure    Goals Met:  No report of cardiac concerns or symptoms Strength training completed today  Goals Unmet:  Not Applicable  Comments: Service time is from 1000 to 1105    Dr. Traci Turner is Medical Director for Cardiac Rehab at [facility]. 

## 2019-11-26 ENCOUNTER — Other Ambulatory Visit: Payer: Self-pay | Admitting: Internal Medicine

## 2019-11-26 ENCOUNTER — Encounter (HOSPITAL_COMMUNITY)
Admission: RE | Admit: 2019-11-26 | Discharge: 2019-11-26 | Disposition: A | Discharge status: Home / Self Care | Payer: Medicare Other | Source: Ambulatory Visit | Attending: Internal Medicine | Admitting: Internal Medicine

## 2019-11-26 ENCOUNTER — Other Ambulatory Visit: Payer: Self-pay

## 2019-11-26 DIAGNOSIS — I2510 Atherosclerotic heart disease of native coronary artery without angina pectoris: Secondary | ICD-10-CM

## 2019-11-26 DIAGNOSIS — J849 Interstitial pulmonary disease, unspecified: Secondary | ICD-10-CM | POA: Diagnosis not present

## 2019-11-26 NOTE — Progress Notes (Signed)
Daily Session Note  Patient Details  Name: LAASIA ARCOS MRN: [id number] Date of Birth: Feb 11, 1946 Referring Provider:     Pulmonary Rehab Walk Test from 11/11/2019 in [place]  Referring Provider  [name]      Encounter Date: 11/26/2019  Check In: Session Check In - 11/26/19 0956      Check-In   Supervising physician immediately available to respond to emergencies  [facility] immediately available    Physician(s)  Dr. Posey Pronto    Location  [facility]    Staff Present  Rosebud Poles, RN, Bjorn Loser, MS, Exercise Physiologist;Lisa Ysidro Evert, RN    Virtual Visit  No    Medication changes reported      No    Fall or balance concerns reported     No    Tobacco Cessation  No Change    Warm-up and Cool-down  Performed as group-led instruction    Resistance Training Performed  Yes    VAD Patient?  No    PAD/SET Patient?  No      Pain Assessment   Currently in Pain?  No/denies    Multiple Pain Sites  No       Capillary Blood Glucose: No results found for this or any previous visit (from the past 24 hour(s)).    Social History   Tobacco Use  Smoking Status Never Smoker  Smokeless Tobacco Never Used  Tobacco Comment   positive passive tobacco smoke exposure    Goals Met:  Independence with exercise equipment Exercise tolerated well Strength training completed today  Goals Unmet:  Not Applicable  Comments: Service time is from 0950 to 1100    Dr. Fransico Him is Medical Director for Cardiac Rehab at [facility].

## 2019-11-28 ENCOUNTER — Encounter (HOSPITAL_COMMUNITY)
Admission: RE | Admit: 2019-11-28 | Discharge: 2019-11-28 | Disposition: A | Discharge status: Home / Self Care | Payer: Medicare Other | Source: Ambulatory Visit | Attending: Internal Medicine | Admitting: Internal Medicine

## 2019-11-28 ENCOUNTER — Other Ambulatory Visit: Payer: Self-pay

## 2019-11-28 DIAGNOSIS — J849 Interstitial pulmonary disease, unspecified: Secondary | ICD-10-CM | POA: Diagnosis not present

## 2019-11-28 NOTE — Progress Notes (Signed)
Daily Session Note  Patient Details  Name: STEPHANEY STEVEN MRN: [id number] Date of Birth: 12-26-1945 Referring Provider:     Pulmonary Rehab Walk Test from 11/11/2019 in [place]  Referring Provider  [name]      Encounter Date: 11/28/2019  Check In: Session Check In - 11/28/19 1015      Check-In   Supervising physician immediately available to respond to emergencies  [facility] immediately available    Physician(s)  Dr. Cyndia Skeeters    Location  [facility]    Staff Present  Rosebud Poles, RN, Roque Cash, RN    Virtual Visit  No    Medication changes reported      No    Fall or balance concerns reported     No    Tobacco Cessation  No Change    Warm-up and Cool-down  Performed as group-led instruction    Resistance Training Performed  Yes    VAD Patient?  No    PAD/SET Patient?  No      Pain Assessment   Currently in Pain?  No/denies    Multiple Pain Sites  No       Capillary Blood Glucose: No results found for this or any previous visit (from the past 24 hour(s)).    Social History   Tobacco Use  Smoking Status Never Smoker  Smokeless Tobacco Never Used  Tobacco Comment   positive passive tobacco smoke exposure    Goals Met:  Exercise tolerated well No report of cardiac concerns or symptoms Strength training completed today  Goals Unmet:  Not Applicable  Comments: Service time is from 1000 to 1100    Dr. Fransico Him is Medical Director for Cardiac Rehab at [facility].

## 2019-12-03 ENCOUNTER — Other Ambulatory Visit: Payer: Self-pay

## 2019-12-03 ENCOUNTER — Encounter (HOSPITAL_COMMUNITY)
Admission: RE | Admit: 2019-12-03 | Discharge: 2019-12-03 | Disposition: A | Discharge status: Home / Self Care | Payer: Medicare Other | Source: Ambulatory Visit | Attending: Internal Medicine | Admitting: Internal Medicine

## 2019-12-03 VITALS — Wt 202.8 lb

## 2019-12-03 DIAGNOSIS — J849 Interstitial pulmonary disease, unspecified: Secondary | ICD-10-CM | POA: Diagnosis not present

## 2019-12-03 NOTE — Progress Notes (Signed)
Daily Session Note  Patient Details  Name: [name] MRN: [id number] Date of Birth: 1945-08-27 Referring Provider:     Pulmonary Rehab Walk Test from 11/11/2019 in [place]  Referring Provider  [name]      Encounter Date: 12/03/2019  Check In: Session Check In - 12/03/19 1004      Check-In   Supervising physician immediately available to respond to emergencies  [facility] immediately available    Physician(s)  Dr. Cyndia Skeeters    Location  [facility]    Staff Present  Rosebud Poles, RN, BSN;Veldon Wager Ysidro Evert, RN;Dalton Kris Mouton, MS, CEP, Exercise Physiologist    Virtual Visit  No    Medication changes reported      No    Fall or balance concerns reported     No    Tobacco Cessation  No Change    Warm-up and Cool-down  Performed as group-led instruction    Resistance Training Performed  Yes    VAD Patient?  No    PAD/SET Patient?  No      Pain Assessment   Currently in Pain?  No/denies    Multiple Pain Sites  No       Capillary Blood Glucose: No results found for this or any previous visit (from the past 24 hour(s)).  Exercise Prescription Changes - 12/03/19 1100      Response to Exercise   Blood Pressure (Admit)  134/70    Blood Pressure (Exercise)  126/60    Blood Pressure (Exit)  116/80    Heart Rate (Admit)  89 bpm    Heart Rate (Exercise)  86 bpm    Heart Rate (Exit)  88 bpm    Oxygen Saturation (Admit)  95 %    Oxygen Saturation (Exercise)  94 %    Oxygen Saturation (Exit)  97 %    Rating of Perceived Exertion (Exercise)  11    Perceived Dyspnea (Exercise)  1    Duration  Continue with 30 min of aerobic exercise without signs/symptoms of physical distress.    Intensity  THRR unchanged      Progression   Progression  Continue to follow PAD protocol      Resistance Training   Training Prescription  Yes    Weight  orange bands    Reps  10-15    Time  10 Minutes      Oxygen   Oxygen  Continuous     Liters  4      NuStep   Level  3    SPM  80    Minutes  30    METs  2       Social History   Tobacco Use  Smoking Status Never Smoker  Smokeless Tobacco Never Used  Tobacco Comment   positive passive tobacco smoke exposure    Goals Met:  Exercise tolerated well No report of cardiac concerns or symptoms Strength training completed today  Goals Unmet:  Not Applicable  Comments: Service time is from 1005 to 31    Dr. Fransico Him is Medical Director for Cardiac Rehab at [facility].

## 2019-12-04 NOTE — Progress Notes (Signed)
Pulmonary Individual Treatment Plan  Patient Details  Name: SHANAUTICA FORKER MRN: [id number] Date of Birth: 07-24-1945 Referring Provider:     Pulmonary Rehab Walk Test from 11/11/2019 in [place]  Referring Provider  [name]      Initial Encounter Date:    Pulmonary Rehab Walk Test from 11/11/2019 in [place]  Date  11/11/19      Visit Diagnosis: Interstitial lung disease ([place])  Patient's Home Medications on Admission:   Current Outpatient Medications:  .  albuterol (VENTOLIN HFA) 108 (90 Base) MCG/ACT inhaler, USE 2 PUFFS BY MOUTH EVERY  6 HOURS AS NEEDED, Disp: 34 g, Rfl: 2 .  aspirin EC 81 MG tablet, Take 81 mg by mouth daily., Disp: , Rfl:  .  benzonatate (TESSALON) 200 MG capsule, TAKE 1 CAPSULE BY MOUTH 3 TIMES A DAY AS NEEDED FOR COUGH (Patient taking differently: Take 200 mg by mouth 3 (three) times daily as needed for cough. TAKE 1 CAPSULE BY MOUTH 3 TIMES A DAY AS NEEDED FOR COUGH), Disp: 30 capsule, Rfl: 3 .  cetirizine (ZYRTEC) 10 MG tablet, Take 10 mg by mouth daily., Disp: , Rfl:  .  clidinium-chlordiazePOXIDE (LIBRAX) 5-2.5 MG capsule, Take 1 capsule by mouth 2 (two) times daily as needed (for IBS)., Disp: , Rfl:  .  colestipol (COLESTID) 1 G tablet, Take 1 g by mouth 2 (two) times daily. , Disp: , Rfl:  .  dicyclomine (BENTYL) 20 MG tablet, Take 20 mg by mouth 4 (four) times daily -  before meals and at bedtime., Disp: , Rfl:  .  FLUoxetine (PROZAC) 20 MG capsule, Take 20 mg by mouth daily., Disp: , Rfl:  .  fluticasone (FLONASE) 50 MCG/ACT nasal spray, Place 2 sprays into both nostrils daily., Disp: 11.1 mL, Rfl: 6 .  Fluticasone-Umeclidin-Vilant (TRELEGY ELLIPTA) 100-62.5-25 MCG/INH AEPB, Inhale 1 puff into the lungs daily., Disp: 3 each, Rfl: 4 .  furosemide (LASIX) 40 MG tablet, TAKE 1 TABLET BY MOUTH  TWICE DAILY, Disp: 180 tablet, Rfl: 3 .  glycopyrrolate (ROBINUL) 1 MG tablet, Take 1 mg by  mouth 3 (three) times daily as needed (for IBS). , Disp: , Rfl:  .  hydroxychloroquine (PLAQUENIL) 200 MG tablet, Take 2 tablets (400 mg total) by mouth daily. Stop for 2 weeks, post-op., Disp: , Rfl:  .  ipratropium (ATROVENT) 0.03 % nasal spray, USE 1 TO 2 SPRAYS INTO BOTH NOSTRILS 2 TIMES DAILY, Disp: 90 mL, Rfl: 1 .  ipratropium-albuterol (DUONEB) 0.5-2.5 (3) MG/3ML SOLN, USE 1 VIAL IN NEBULIZER EVERY 6 HOURS AS NEEDED *J45.901* npi [id number], Disp: 270 mL, Rfl: 11 .  leflunomide (ARAVA) 20 MG tablet, Take 20 mg by mouth daily. , Disp: , Rfl:  .  methocarbamol (ROBAXIN) 500 MG tablet, Take 1 tablet (500 mg total) by mouth every 6 (six) hours as needed for muscle spasms., Disp: 40 tablet, Rfl: 0 .  mirtazapine (REMERON) 45 MG tablet, Take 45 mg by mouth at bedtime., Disp: , Rfl:  .  montelukast (SINGULAIR) 10 MG tablet, TAKE 1 TABLET BY MOUTH AT  BEDTIME, Disp: 90 tablet, Rfl: 3 .  Multiple Vitamin (MULTIVITAMIN) tablet, Take 1 tablet by mouth daily., Disp: , Rfl:  .  omeprazole (PRILOSEC) 40 MG capsule, Take 40 mg by mouth at bedtime. , Disp: , Rfl:  .  OXYGEN, Inhale 4 mLs into the lungs continuous. , Disp: , Rfl:  .  Pirfenidone (ESBRIET) 801 MG TABS,  Take 801 mg by mouth 3 (three) times daily., Disp: 90 tablet, Rfl: 11 .  potassium chloride SA (KLOR-CON) 20 MEQ tablet, TAKE 1 TABLET BY MOUTH  TWICE DAILY, Disp: 180 tablet, Rfl: 3 .  predniSONE (DELTASONE) 5 MG tablet, Take 5 mg by mouth daily with breakfast., Disp: , Rfl:  .  rosuvastatin (CRESTOR) 20 MG tablet, Take 1 tablet (20 mg total) by mouth daily., Disp: 90 tablet, Rfl: 3 .  verapamil (CALAN-SR) 240 MG CR tablet, TAKE 1 TABLET BY MOUTH  DAILY, Disp: 90 tablet, Rfl: 3  Past Medical History: Past Medical History:  Diagnosis Date  . Acute asthmatic bronchitis   . Allergic rhinitis   . Anxiety   . Arthritis   . Esophageal reflux   . Hypertension   . Interstitial lung disease ([place]) dx jan 2020  . Irritable bowel syndrome   .  Oxygen dependent    4 liters day time 6 liters at night  . PONV (postoperative nausea and vomiting)    ponv likes zofran, and scopolamine patch  . Rheumatoid arthritis(714.0)   . Sleep apnea     Tobacco Use: Social History   Tobacco Use  Smoking Status Never Smoker  Smokeless Tobacco Never Used  Tobacco Comment   positive passive tobacco smoke exposure    Labs: Recent Review Flowsheet Data    Labs for ITP Cardiac and Pulmonary Rehab Latest Ref Rng & Units 11/15/2012 09/16/2016 09/16/2016 09/16/2016 01/09/2017   Cholestrol 100 - 199 mg/dL - - - - 127   LDLCALC 0 - 99 mg/dL - - - - 38   LDLDIRECT 0 - 99 mg/dL - - - - 53   HDL >39 mg/dL - - - - 53   Trlycerides 0 - 149 mg/dL - - - - 181(H)   Hemoglobin A1c 4.8 - 5.6 % 6.0(H) - - - -   PHART 7.350 - 7.450 - - 7.366 - -   PCO2ART 32.0 - 48.0 mmHg - - 38.3 - -   HCO3 20.0 - 28.0 mmol/L - 23.7 21.9 23.3 -   TCO2 0 - 100 mmol/L - _0 -   ACIDBASEDEF 0.0 - 2.0 mmol/L - 1.0 3.0(H) 2.0 -   O2SAT % - 70.0 94.0 75.0 -      Capillary Blood Glucose: Lab Results  Component Value Date   GLUCAP 87 08/01/2013   GLUCAP 81 08/01/2013   GLUCAP 156 (H) 09/11/2010     Pulmonary Assessment Scores: Pulmonary Assessment Scores    Row Name 11/11/19 1102         ADL UCSD   ADL Phase  Entry     SOB Score total  55       CAT Score   CAT Score  17       UCSD: Self-administered rating of dyspnea associated with activities of daily living (ADLs) 6-point scale (0 = "not at all" to 5 = "maximal or unable to do because of breathlessness")  Scoring Scores range from 0 to 120.  Minimally important difference is 5 units  CAT: CAT can identify the health impairment of COPD patients and is better correlated with disease progression.  CAT has a scoring range of zero to 40. The CAT score is classified into four groups of low (less than 10), medium (10 - 20), high (21-30) and very high (31-40) based on the impact level of disease on health status. A  CAT score over 10 suggests significant symptoms.  A worsening CAT  score could be explained by an exacerbation, poor medication adherence, poor inhaler technique, or progression of COPD or comorbid conditions.  CAT MCID is 2 points  mMRC: mMRC (Modified Medical Research Council) Dyspnea Scale is used to assess the degree of baseline functional disability in patients of respiratory disease due to dyspnea. No minimal important difference is established. A decrease in score of 1 point or greater is considered a positive change.   Pulmonary Function Assessment: Pulmonary Function Assessment - 11/11/19 1100      Breath   Bilateral Breath Sounds  Clear    Shortness of Breath  Yes;Limiting activity;Fear of Shortness of Breath       Exercise Target Goals: Exercise Program Goal: Individual exercise prescription set using results from initial 6 min walk test and THRR while considering  patient's activity barriers and safety.   Exercise Prescription Goal: Initial exercise prescription builds to 30-45 minutes a day of aerobic activity, 2-3 days per week.  Home exercise guidelines will be given to patient during program as part of exercise prescription that the participant will acknowledge.  Activity Barriers & Risk Stratification: Activity Barriers & Cardiac Risk Stratification - 11/11/19 1052      Activity Barriers & Cardiac Risk Stratification   Activity Barriers  Arthritis;Right Hip Replacement;Left Knee Replacement;Right Knee Replacement;Joint Problems;Deconditioning;Muscular Weakness;Shortness of Breath;History of Falls;Assistive Device       6 Minute Walk: 6 Minute Walk    Row Name 11/11/19 1209         6 Minute Walk   Distance  658 feet     Walk Time  5.5 minutes     # of Rest Breaks  1     MPH  1.24     METS  1.29     RPE  15     Perceived Dyspnea   2     VO2 Peak  4.5     Symptoms  Yes (comment)     Comments  used wheelchair. One seated rest break for 30 sec due to SOB      Resting HR  78 bpm     Resting BP  120/70     Resting Oxygen Saturation   97 %     Exercise Oxygen Saturation  during 6 min walk  90 %     Max Ex. HR  131 bpm     Max Ex. BP  144/88     2 Minute Post BP  124/80       Interval HR   1 Minute HR  99     2 Minute HR  112     3 Minute HR  120     4 Minute HR  128     5 Minute HR  125     6 Minute HR  131     2 Minute Post HR  111     Interval Heart Rate?  Yes       Interval Oxygen   Interval Oxygen?  Yes     Baseline Oxygen Saturation %  97 %     1 Minute Oxygen Saturation %  94 %     1 Minute Liters of Oxygen  4 L     2 Minute Oxygen Saturation %  93 %     2 Minute Liters of Oxygen  4 L     3 Minute Oxygen Saturation %  93 %     3 Minute Liters of Oxygen  4 L  4 Minute Oxygen Saturation %  92 %     4 Minute Liters of Oxygen  4 L     5 Minute Oxygen Saturation %  92 %     5 Minute Liters of Oxygen  4 L     6 Minute Oxygen Saturation %  90 %     6 Minute Liters of Oxygen  4 L     2 Minute Post Oxygen Saturation %  97 %     2 Minute Post Liters of Oxygen  4 L        Oxygen Initial Assessment: Oxygen Initial Assessment - 11/11/19 1209      Home Oxygen   Home Oxygen Device  Home Concentrator;Portable Concentrator    Sleep Oxygen Prescription  Continuous    Liters per minute  4    Home Exercise Oxygen Prescription  Continuous    Liters per minute  4    Home at Rest Exercise Oxygen Prescription  Continuous    Liters per minute  4    Compliance with Home Oxygen Use  Yes      Initial 6 min Walk   Oxygen Used  Continuous    Liters per minute  4      Program Oxygen Prescription   Program Oxygen Prescription  Continuous    Liters per minute  4      Intervention   Short Term Goals  To learn and exhibit compliance with exercise, home and travel O2 prescription;To learn and understand importance of monitoring SPO2 with pulse oximeter and demonstrate accurate use of the pulse oximeter.;To learn and understand importance of  maintaining oxygen saturations>88%;To learn and demonstrate proper pursed lip breathing techniques or other breathing techniques.;To learn and demonstrate proper use of respiratory medications    Long  Term Goals  Exhibits compliance with exercise, home and travel O2 prescription;Verbalizes importance of monitoring SPO2 with pulse oximeter and return demonstration;Maintenance of O2 saturations>88%;Compliance with respiratory medication;Exhibits proper breathing techniques, such as pursed lip breathing or other method taught during program session;Demonstrates proper use of MDI's       Oxygen Re-Evaluation: Oxygen Re-Evaluation    Row Name 12/03/19 0716             Program Oxygen Prescription   Program Oxygen Prescription  Continuous       Liters per minute  4         Home Oxygen   Home Oxygen Device  Home Concentrator;Portable Concentrator       Sleep Oxygen Prescription  Continuous       Liters per minute  4       Home Exercise Oxygen Prescription  Continuous       Liters per minute  4       Home at Rest Exercise Oxygen Prescription  Continuous       Liters per minute  4         Goals/Expected Outcomes   Short Term Goals  To learn and exhibit compliance with exercise, home and travel O2 prescription;To learn and understand importance of monitoring SPO2 with pulse oximeter and demonstrate accurate use of the pulse oximeter.;To learn and understand importance of maintaining oxygen saturations>88%;To learn and demonstrate proper pursed lip breathing techniques or other breathing techniques.;To learn and demonstrate proper use of respiratory medications       Long  Term Goals  Exhibits compliance with exercise, home and travel O2 prescription;Verbalizes importance of monitoring SPO2 with pulse oximeter and return demonstration;Maintenance  of O2 saturations>88%;Compliance with respiratory medication;Exhibits proper breathing techniques, such as pursed lip breathing or other method taught  during program session;Demonstrates proper use of MDI's       Goals/Expected Outcomes  compliance           Oxygen Discharge (Final Oxygen Re-Evaluation): Oxygen Re-Evaluation - 12/03/19 0716      Program Oxygen Prescription   Program Oxygen Prescription  Continuous    Liters per minute  4      Home Oxygen   Home Oxygen Device  Home Concentrator;Portable Concentrator    Sleep Oxygen Prescription  Continuous    Liters per minute  4    Home Exercise Oxygen Prescription  Continuous    Liters per minute  4    Home at Rest Exercise Oxygen Prescription  Continuous    Liters per minute  4      Goals/Expected Outcomes   Short Term Goals  To learn and exhibit compliance with exercise, home and travel O2 prescription;To learn and understand importance of monitoring SPO2 with pulse oximeter and demonstrate accurate use of the pulse oximeter.;To learn and understand importance of maintaining oxygen saturations>88%;To learn and demonstrate proper pursed lip breathing techniques or other breathing techniques.;To learn and demonstrate proper use of respiratory medications    Long  Term Goals  Exhibits compliance with exercise, home and travel O2 prescription;Verbalizes importance of monitoring SPO2 with pulse oximeter and return demonstration;Maintenance of O2 saturations>88%;Compliance with respiratory medication;Exhibits proper breathing techniques, such as pursed lip breathing or other method taught during program session;Demonstrates proper use of MDI's    Goals/Expected Outcomes  compliance        Initial Exercise Prescription: Initial Exercise Prescription - 11/11/19 1200      Date of Initial Exercise RX and Referring Provider   Date  11/11/19    Referring Provider  [name]      Oxygen   Oxygen  Continuous    Liters  4      NuStep   Level  2    SPM  80    Minutes  30      Prescription Details   Frequency (times per week)  2    Duration  Progress to 30 minutes of  continuous aerobic without signs/symptoms of physical distress      Intensity   THRR 40-80% of Max Heartrate  62-124    Ratings of Perceived Exertion  11-13    Perceived Dyspnea  0-4      Progression   Progression  Continue to progress workloads to maintain intensity without signs/symptoms of physical distress.      Resistance Training   Training Prescription  Yes    Weight  orange bands    Reps  10-15       Perform Capillary Blood Glucose checks as needed.  Exercise Prescription Changes: Exercise Prescription Changes    Row Name 11/19/19 1100 12/03/19 1100           Response to Exercise   Blood Pressure (Admit)  140/80  134/70      Blood Pressure (Exercise)  134/78  126/60      Blood Pressure (Exit)  126/64  116/80      Heart Rate (Admit)  79 bpm  89 bpm      Heart Rate (Exercise)  94 bpm  86 bpm      Heart Rate (Exit)  88 bpm  88 bpm      Oxygen Saturation (Admit)  98 %  95 %      Oxygen Saturation (Exercise)  98 %  94 %      Oxygen Saturation (Exit)  98 %  97 %      Rating of Perceived Exertion (Exercise)  12  11      Perceived Dyspnea (Exercise)  0  1      Duration  Progress to 30 minutes of  aerobic without signs/symptoms of physical distress  Continue with 30 min of aerobic exercise without signs/symptoms of physical distress.      Intensity  Other (comment) 40-80 % of HRR  THRR unchanged        Progression   Progression  Continue to progress workloads to maintain intensity without signs/symptoms of physical distress.  Continue to follow PAD protocol        Resistance Training   Training Prescription  Yes  Yes      Weight  orange bands  orange bands      Reps  10-15  10-15      Time  10 Minutes  10 Minutes        Oxygen   Oxygen  Continuous  Continuous      Liters  4  4        NuStep   Level  2  3      SPM  80  80      Minutes  30  30      METs  1.6  2         Exercise Comments:   Exercise Goals and Review: Exercise Goals    Row Name 11/11/19  1213 12/03/19 0716           Exercise Goals   Increase Physical Activity  Yes  Yes      Intervention  Provide advice, education, support and counseling about physical activity/exercise needs.;Develop an individualized exercise prescription for aerobic and resistive training based on initial evaluation findings, risk stratification, comorbidities and participant's personal goals.  Provide advice, education, support and counseling about physical activity/exercise needs.;Develop an individualized exercise prescription for aerobic and resistive training based on initial evaluation findings, risk stratification, comorbidities and participant's personal goals.      Expected Outcomes  Short Term: Attend rehab on a regular basis to increase amount of physical activity.;Long Term: Add in home exercise to make exercise part of routine and to increase amount of physical activity.;Long Term: Exercising regularly at least 3-5 days a week.  Short Term: Attend rehab on a regular basis to increase amount of physical activity.;Long Term: Add in home exercise to make exercise part of routine and to increase amount of physical activity.;Long Term: Exercising regularly at least 3-5 days a week.      Increase Strength and Stamina  Yes  Yes      Intervention  Provide advice, education, support and counseling about physical activity/exercise needs.;Develop an individualized exercise prescription for aerobic and resistive training based on initial evaluation findings, risk stratification, comorbidities and participant's personal goals.  Provide advice, education, support and counseling about physical activity/exercise needs.;Develop an individualized exercise prescription for aerobic and resistive training based on initial evaluation findings, risk stratification, comorbidities and participant's personal goals.      Expected Outcomes  Short Term: Increase workloads from initial exercise prescription for resistance, speed, and  METs.;Long Term: Improve cardiorespiratory fitness, muscular endurance and strength as measured by increased METs and functional capacity (6MWT);Short Term: Perform resistance training exercises routinely during rehab and add in resistance training  at home  Short Term: Increase workloads from initial exercise prescription for resistance, speed, and METs.;Long Term: Improve cardiorespiratory fitness, muscular endurance and strength as measured by increased METs and functional capacity (6MWT);Short Term: Perform resistance training exercises routinely during rehab and add in resistance training at home      Able to understand and use rate of perceived exertion (RPE) scale  Yes  Yes      Intervention  Provide education and explanation on how to use RPE scale  Provide education and explanation on how to use RPE scale      Expected Outcomes  Short Term: Able to use RPE daily in rehab to express subjective intensity level;Long Term:  Able to use RPE to guide intensity level when exercising independently  Short Term: Able to use RPE daily in rehab to express subjective intensity level;Long Term:  Able to use RPE to guide intensity level when exercising independently      Able to understand and use Dyspnea scale  Yes  Yes      Intervention  Provide education and explanation on how to use Dyspnea scale  Provide education and explanation on how to use Dyspnea scale      Expected Outcomes  Short Term: Able to use Dyspnea scale daily in rehab to express subjective sense of shortness of breath during exertion;Long Term: Able to use Dyspnea scale to guide intensity level when exercising independently  Short Term: Able to use Dyspnea scale daily in rehab to express subjective sense of shortness of breath during exertion;Long Term: Able to use Dyspnea scale to guide intensity level when exercising independently      Knowledge and understanding of Target Heart Rate Range (THRR)  Yes  Yes      Intervention  Provide education  and explanation of THRR including how the numbers were predicted and where they are located for reference  Provide education and explanation of THRR including how the numbers were predicted and where they are located for reference      Expected Outcomes  Short Term: Able to state/look up THRR;Short Term: Able to use daily as guideline for intensity in rehab;Long Term: Able to use THRR to govern intensity when exercising independently  Short Term: Able to state/look up THRR;Short Term: Able to use daily as guideline for intensity in rehab;Long Term: Able to use THRR to govern intensity when exercising independently      Understanding of Exercise Prescription  Yes  Yes      Intervention  Provide education, explanation, and written materials on patient's individual exercise prescription  Provide education, explanation, and written materials on patient's individual exercise prescription      Expected Outcomes  Short Term: Able to explain program exercise prescription;Long Term: Able to explain home exercise prescription to exercise independently  Short Term: Able to explain program exercise prescription;Long Term: Able to explain home exercise prescription to exercise independently         Exercise Goals Re-Evaluation : Exercise Goals Re-Evaluation    Row Name 12/03/19 0717             Exercise Goal Re-Evaluation   Exercise Goals Review  Increase Physical Activity;Increase Strength and Stamina;Able to understand and use rate of perceived exertion (RPE) scale;Able to understand and use Dyspnea scale;Knowledge and understanding of Target Heart Rate Range (THRR);Understanding of Exercise Prescription       Comments  Pt has completed 4 exercise sessions. Pt is limited by severe deconditioning and several orthopedic issues. I expect  progress will be slow, but pt is motivated. She currently exercises at 2.0 METs on the stepper.       Expected Outcomes  Through exercise at rehab and at home, the patient will  decrease shortness of breath with daily activities and feel confident in carrying out an exercise regime at home.           Discharge Exercise Prescription (Final Exercise Prescription Changes): Exercise Prescription Changes - 12/03/19 1100      Response to Exercise   Blood Pressure (Admit)  134/70    Blood Pressure (Exercise)  126/60    Blood Pressure (Exit)  116/80    Heart Rate (Admit)  89 bpm    Heart Rate (Exercise)  86 bpm    Heart Rate (Exit)  88 bpm    Oxygen Saturation (Admit)  95 %    Oxygen Saturation (Exercise)  94 %    Oxygen Saturation (Exit)  97 %    Rating of Perceived Exertion (Exercise)  11    Perceived Dyspnea (Exercise)  1    Duration  Continue with 30 min of aerobic exercise without signs/symptoms of physical distress.    Intensity  THRR unchanged      Progression   Progression  Continue to follow PAD protocol      Resistance Training   Training Prescription  Yes    Weight  orange bands    Reps  10-15    Time  10 Minutes      Oxygen   Oxygen  Continuous    Liters  4      NuStep   Level  3    SPM  80    Minutes  30    METs  2       Nutrition:  Target Goals: Understanding of nutrition guidelines, daily intake of sodium <1514m, cholesterol <2064m calories 30% from fat and 7% or less from saturated fats, daily to have 5 or more servings of fruits and vegetables.  Biometrics: Pre Biometrics - 11/11/19 1053      Pre Biometrics   Grip Strength  15 kg        Nutrition Therapy Plan and Nutrition Goals: Nutrition Therapy & Goals - 11/21/19 1203      Nutrition Therapy   Diet  Generally Healthful      Personal Nutrition Goals   Nutrition Goal  Pt to build a healthy plate including vegetables, fruits, whole grains, and low-fat dairy products in a heart healthy meal plan.    Personal Goal #2  Pt to identify food quantities necessary to achieve weight loss of 6-24 lb at graduation from pulmonary rehab.      Intervention Plan   Intervention   Nutrition handout(s) given to patient.;Prescribe, educate and counsel regarding individualized specific dietary modifications aiming towards targeted core components such as weight, hypertension, lipid management, diabetes, heart failure and other comorbidities.    Expected Outcomes  Short Term Goal: A plan has been developed with personal nutrition goals set during dietitian appointment.       Nutrition Assessments:   Nutrition Goals Re-Evaluation: Nutrition Goals Re-Evaluation    [place] 11/21/19 1204             Goals   Current Weight  204 lb (92.5 kg)       Nutrition Goal  Pt to build a healthy plate including vegetables, fruits, whole grains, and low-fat dairy products in a heart healthy meal plan.  Personal Goal #2 Re-Evaluation   Personal Goal #2  Pt to identify food quantities necessary to achieve weight loss of 6-24 lb at graduation from pulmonary rehab.          Nutrition Goals Discharge (Final Nutrition Goals Re-Evaluation): Nutrition Goals Re-Evaluation - 11/21/19 1204      Goals   Current Weight  204 lb (92.5 kg)    Nutrition Goal  Pt to build a healthy plate including vegetables, fruits, whole grains, and low-fat dairy products in a heart healthy meal plan.      Personal Goal #2 Re-Evaluation   Personal Goal #2  Pt to identify food quantities necessary to achieve weight loss of 6-24 lb at graduation from pulmonary rehab.       Psychosocial: Target Goals: Acknowledge presence or absence of significant depression and/or stress, maximize coping skills, provide positive support system. Participant is able to verbalize types and ability to use techniques and skills needed for reducing stress and depression.  Initial Review & Psychosocial Screening: Initial Psych Review & Screening - 11/11/19 1104      Initial Review   Current issues with  History of Depression;Current Depression;Current Stress Concerns    Source of Stress Concerns  Chronic Illness;Unable  to participate in former interests or hobbies;Unable to perform yard/household activities      [place]?  Yes      Barriers   Psychosocial barriers to participate in program  The patient should benefit from training in stress management and relaxation.      Screening Interventions   Interventions  Encouraged to exercise       Quality of Life Scores:  Scores of 19 and below usually indicate a poorer quality of life in these areas.  A difference of  2-3 points is a clinically meaningful difference.  A difference of 2-3 points in the total score of the Quality of Life Index has been associated with significant improvement in overall quality of life, self-image, physical symptoms, and general health in studies assessing change in quality of life.  PHQ-9: Recent Review Flowsheet Data    Depression screen [facility] 2/9 11/11/2019 09/10/2018 05/09/2018   Decreased Interest 0 0 0   Down, Depressed, Hopeless 0 1 0   PHQ - 2 Score 0 1 0   Altered sleeping 0 3 -   Tired, decreased energy 3 3 -   Change in appetite 0 0 -   Feeling bad or failure about yourself  0 1 -   Trouble concentrating 0 0 -   Moving slowly or fidgety/restless 0 0 -   Suicidal thoughts 0 0 -   Difficult doing work/chores Somewhat difficult Somewhat difficult -     Interpretation of Total Score  Total Score Depression Severity:  1-4 = Minimal depression, 5-9 = Mild depression, 10-14 = Moderate depression, 15-19 = Moderately severe depression, 20-27 = Severe depression   Psychosocial Evaluation and Intervention: Psychosocial Evaluation - 11/11/19 1105      Psychosocial Evaluation & Interventions   Interventions  Stress management education;Relaxation education;Encouraged to exercise with the program and follow exercise prescription    Comments  Depression due to unable to participate in activities she used to do    Expected Outcomes  To handle her depression in healthy manners.    Continue  Psychosocial Services   Follow up required by staff       Psychosocial Re-Evaluation: Psychosocial Re-Evaluation    [place] Name 12/03/19 [id number]  Psychosocial Re-Evaluation   Current issues with  Current Depression;History of Depression;Current Stress Concerns       Comments  Uzma has just started the program, has attended 4 exercise sessions and is doing well.  She is pleased that she is able to tolerate the exercise despite her rheumatoid arthritis, her current depression with a history of depression is stable at this time.       Expected Outcomes  For Monicka to continue without barriers or psychosocial concerns while participating in pulmonary rehab.       Interventions  Encouraged to attend Pulmonary Rehabilitation for the exercise;Stress management education;Relaxation education       Continue Psychosocial Services   Follow up required by staff       Comments  Seems to be handling strssors in healthy ways.         Initial Review   Source of Stress Concerns  Chronic Illness;Unable to participate in former interests or hobbies;Unable to perform yard/household activities          Psychosocial Discharge (Final Psychosocial Re-Evaluation): Psychosocial Re-Evaluation - 12/03/19 0827      Psychosocial Re-Evaluation   Current issues with  Current Depression;History of Depression;Current Stress Concerns    Comments  Sharde has just started the program, has attended 4 exercise sessions and is doing well.  She is pleased that she is able to tolerate the exercise despite her rheumatoid arthritis, her current depression with a history of depression is stable at this time.    Expected Outcomes  For Britney to continue without barriers or psychosocial concerns while participating in pulmonary rehab.    Interventions  Encouraged to attend Pulmonary Rehabilitation for the exercise;Stress management education;Relaxation education    Continue Psychosocial Services   Follow up required by  staff    Comments  Seems to be handling strssors in healthy ways.      Initial Review   Source of Stress Concerns  Chronic Illness;Unable to participate in former interests or hobbies;Unable to perform yard/household activities       Education: Education Goals: Education classes will be provided on a weekly basis, covering required topics. Participant will state understanding/return demonstration of topics presented.  Learning Barriers/Preferences: Learning Barriers/Preferences - 11/11/19 1107      Learning Barriers/Preferences   Learning Barriers  None    Learning Preferences  Audio;Computer/Internet;Skilled Demonstration;Pictoral;Verbal Instruction;Video;Written Material       Education Topics: Risk Factor Reduction:  -Group instruction that is supported by a PowerPoint presentation. Instructor discusses the definition of a risk factor, different risk factors for pulmonary disease, and how the heart and lungs work together.     Nutrition for Pulmonary Patient:  -Group instruction provided by PowerPoint slides, verbal discussion, and written materials to support subject matter. The instructor gives an explanation and review of healthy diet recommendations, which includes a discussion on weight management, recommendations for fruit and vegetable consumption, as well as protein, fluid, caffeine, fiber, sodium, sugar, and alcohol. Tips for eating when patients are short of breath are discussed.   Pursed Lip Breathing:  -Group instruction that is supported by demonstration and informational handouts. Instructor discusses the benefits of pursed lip and diaphragmatic breathing and detailed demonstration on how to preform both.     Oxygen Safety:  -Group instruction provided by PowerPoint, verbal discussion, and written material to support subject matter. There is an overview of "What is Oxygen" and "Why do we need it".  Instructor also reviews how to create a safe environment for  oxygen  use, the importance of using oxygen as prescribed, and the risks of noncompliance. There is a brief discussion on traveling with oxygen and resources the patient may utilize.   Oxygen Equipment:  -Group instruction provided by [facility] Staff utilizing handouts, written materials, and equipment demonstrations.   Signs and Symptoms:  -Group instruction provided by written material and verbal discussion to support subject matter. Warning signs and symptoms of infection, stroke, and heart attack are reviewed and when to call the physician/911 reinforced. Tips for preventing the spread of infection discussed.   Advanced Directives:  -Group instruction provided by verbal instruction and written material to support subject matter. Instructor reviews Advanced Directive laws and proper instruction for filling out document.   Pulmonary Video:  -Group video education that reviews the importance of medication and oxygen compliance, exercise, good nutrition, pulmonary hygiene, and pursed lip and diaphragmatic breathing for the pulmonary patient.   Exercise for the Pulmonary Patient:  -Group instruction that is supported by a PowerPoint presentation. Instructor discusses benefits of exercise, core components of exercise, frequency, duration, and intensity of an exercise routine, importance of utilizing pulse oximetry during exercise, safety while exercising, and options of places to exercise outside of rehab.     Pulmonary Medications:  -Verbally interactive group education provided by instructor with focus on inhaled medications and proper administration.   Anatomy and Physiology of the Respiratory System and Intimacy:  -Group instruction provided by PowerPoint, verbal discussion, and written material to support subject matter. Instructor reviews respiratory cycle and anatomical components of the respiratory system and their functions. Instructor also reviews differences in obstructive and  restrictive respiratory diseases with examples of each. Intimacy, Sex, and Sexuality differences are reviewed with a discussion on how relationships can change when diagnosed with pulmonary disease. Common sexual concerns are reviewed.   PULMONARY REHAB OTHER RESPIRATORY from 11/26/2019 in [place]  Date  11/26/19  Educator  DF  Instruction Review Code  2- Demonstrated Understanding      MD DAY -A group question and answer session with a medical doctor that allows participants to ask questions that relate to their pulmonary disease state.   OTHER EDUCATION -Group or individual verbal, written, or video instructions that support the educational goals of the pulmonary rehab program.   PULMONARY REHAB OTHER RESPIRATORY from 11/26/2019 in [place]  Date  11/19/19  Educator  Handout [Know your numbers]      Holiday Eating Survival Tips:  -Group instruction provided by PowerPoint slides, verbal discussion, and written materials to support subject matter. The instructor gives patients tips, tricks, and techniques to help them not only survive but enjoy the holidays despite the onslaught of food that accompanies the holidays.   Knowledge Questionnaire Score: Knowledge Questionnaire Score - 11/11/19 1206      Knowledge Questionnaire Score   Pre Score  14/18       Core Components/Risk Factors/Patient Goals at Admission: Personal Goals and Risk Factors at Admission - 11/11/19 1107      Core Components/Risk Factors/Patient Goals on Admission    Weight Management  Obesity    Improve shortness of breath with ADL's  Yes    Intervention  Provide education, individualized exercise plan and daily activity instruction to help decrease symptoms of SOB with activities of daily living.    Expected Outcomes  Short Term: Improve cardiorespiratory fitness to achieve a reduction of symptoms when performing ADLs;Long Term: Be able to  perform  more ADLs without symptoms or delay the onset of symptoms    Stress  Yes    Intervention  Offer individual and/or small group education and counseling on adjustment to heart disease, stress management and health-related lifestyle change. Teach and support self-help strategies.;Refer participants experiencing significant psychosocial distress to appropriate mental health specialists for further evaluation and treatment. When possible, include family members and significant others in education/counseling sessions.    Expected Outcomes  Short Term: Participant demonstrates changes in health-related behavior, relaxation and other stress management skills, ability to obtain effective social support, and compliance with psychotropic medications if prescribed.;Long Term: Emotional wellbeing is indicated by absence of clinically significant psychosocial distress or social isolation.       Core Components/Risk Factors/Patient Goals Review:  Goals and Risk Factor Review    Row Name 11/11/19 1109 12/03/19 0831           Core Components/Risk Factors/Patient Goals Review   Personal Goals Review  Develop more efficient breathing techniques such as purse lipped breathing and diaphragmatic breathing and practicing self-pacing with activity.;Increase knowledge of respiratory medications and ability to use respiratory devices properly.;Improve shortness of breath with ADL's;Stress;Weight Management/Obesity  Improve shortness of breath with ADL's;Develop more efficient breathing techniques such as purse lipped breathing and diaphragmatic breathing and practicing self-pacing with activity.;Increase knowledge of respiratory medications and ability to use respiratory devices properly.;Stress      Review  --  Has exercised in 4 sessions, is doing well, is on level 3 of the nustep for 30 minutes with a met level of 2.0.  No problems identified at this point.      Expected Outcomes  --  See admission goals.          Core Components/Risk Factors/Patient Goals at Discharge (Final Review):  Goals and Risk Factor Review - 12/03/19 0831      Core Components/Risk Factors/Patient Goals Review   Personal Goals Review  Improve shortness of breath with ADL's;Develop more efficient breathing techniques such as purse lipped breathing and diaphragmatic breathing and practicing self-pacing with activity.;Increase knowledge of respiratory medications and ability to use respiratory devices properly.;Stress    Review  Has exercised in 4 sessions, is doing well, is on level 3 of the nustep for 30 minutes with a met level of 2.0.  No problems identified at this point.    Expected Outcomes  See admission goals.       ITP Comments:   Comments: ITP REVIEW Pt is making expected progress toward pulmonary rehab goals after completing 5 sessions. Recommend continued exercise, life style modification, education, and utilization of breathing techniques to increase stamina and strength and decrease shortness of breath with exertion.

## 2019-12-05 ENCOUNTER — Other Ambulatory Visit: Payer: Self-pay

## 2019-12-05 ENCOUNTER — Encounter (HOSPITAL_COMMUNITY)
Admission: RE | Admit: 2019-12-05 | Discharge: 2019-12-05 | Disposition: A | Discharge status: Home / Self Care | Payer: Medicare Other | Source: Ambulatory Visit | Attending: Internal Medicine | Admitting: Internal Medicine

## 2019-12-05 DIAGNOSIS — J849 Interstitial pulmonary disease, unspecified: Secondary | ICD-10-CM

## 2019-12-05 NOTE — Progress Notes (Signed)
Daily Session Note  Patient Details  Name: [name] MRN: [id number] Date of Birth: 05-19-1946 Referring Provider:     Pulmonary Rehab Walk Test from 11/11/2019 in [place]  Referring Provider  [name]      Encounter Date: 12/05/2019  Check In: Session Check In - 12/05/19 1000      Check-In   Supervising physician immediately available to respond to emergencies  [facility] immediately available    Physician(s)  Dr. Tyrell Antonio    Location  [facility]    Staff Present  Michaele Offer RD, LDN;Dalton Kris Mouton, MS, CEP, Exercise Physiologist;Varina Hulon Ysidro Evert, RN    Virtual Visit  No    Medication changes reported      No    Fall or balance concerns reported     No    Tobacco Cessation  No Change    Warm-up and Cool-down  Performed on first and last piece of equipment    Resistance Training Performed  Yes    VAD Patient?  No    PAD/SET Patient?  No      Pain Assessment   Currently in Pain?  No/denies    Multiple Pain Sites  No       Capillary Blood Glucose: No results found for this or any previous visit (from the past 24 hour(s)).    Social History   Tobacco Use  Smoking Status Never Smoker  Smokeless Tobacco Never Used  Tobacco Comment   positive passive tobacco smoke exposure    Goals Met:  Exercise tolerated well No report of cardiac concerns or symptoms Strength training completed today  Goals Unmet:  Not Applicable  Comments: Service time is from 1000 to 1100    Dr. Fransico Him is Medical Director for Cardiac Rehab at [facility].

## 2019-12-05 NOTE — Progress Notes (Signed)
I have reviewed a Home Exercise Prescription with [name] . [name] is  currently exercising at home.  The patient was advised to walk 2 days a week for 20-30 minutes.  Magdalen and I discussed how to progress their exercise prescription.  The patient stated that their goals were to play golf again.  The patient stated that they understand the exercise prescription.  We reviewed exercise guidelines, target heart rate during exercise, RPE Scale, weather conditions, NTG use, endpoints for exercise, warmup and cool down.  Patient is encouraged to come to me with any questions. I will continue to follow up with the patient to assist them with progression and safety.

## 2019-12-10 ENCOUNTER — Other Ambulatory Visit: Payer: Self-pay

## 2019-12-10 ENCOUNTER — Encounter (HOSPITAL_COMMUNITY)
Admission: RE | Admit: 2019-12-10 | Discharge: 2019-12-10 | Disposition: A | Discharge status: Home / Self Care | Payer: Medicare Other | Source: Ambulatory Visit | Attending: Internal Medicine | Admitting: Internal Medicine

## 2019-12-10 DIAGNOSIS — J849 Interstitial pulmonary disease, unspecified: Secondary | ICD-10-CM

## 2019-12-10 NOTE — Progress Notes (Signed)
Daily Session Note  Patient Details  Name: [name] MRN: [id number] Date of Birth: [birth year] Referring Provider:     Pulmonary Rehab Walk Test from 11/11/2019 in [place]  Referring Provider  [name]      Encounter Date: 12/10/2019  Check In: Session Check In - 12/10/19 0956      Check-In   Supervising physician immediately available to respond to emergencies  [facility] immediately available    Physician(s)  Dr. Tyrell Antonio    Location  [facility]    Staff Present  Hoy Register, MS, CEP, Exercise Physiologist;Joan [place] Reeves, RN, Roque Cash, RN    Virtual Visit  No    Medication changes reported      No    Fall or balance concerns reported     No    Tobacco Cessation  No Change    Warm-up and Cool-down  Performed on first and last piece of equipment    Resistance Training Performed  Yes    VAD Patient?  No    PAD/SET Patient?  No      Pain Assessment   Currently in Pain?  No/denies    Multiple Pain Sites  No       Capillary Blood Glucose: No results found for this or any previous visit (from the past 24 hour(s)).    Social History   Tobacco Use  Smoking Status Never Smoker  Smokeless Tobacco Never Used  Tobacco Comment   positive passive tobacco smoke exposure    Goals Met:  Independence with exercise equipment Exercise tolerated well Strength training completed today  Goals Unmet:  Not Applicable  Comments: Service time is from 0958 to 1050    Dr. Fransico Him is Medical Director for Cardiac Rehab at [facility].

## 2019-12-11 ENCOUNTER — Encounter: Payer: Medicare Other | Admitting: Internal Medicine

## 2019-12-11 DIAGNOSIS — J849 Interstitial pulmonary disease, unspecified: Secondary | ICD-10-CM

## 2019-12-11 NOTE — Research (Signed)
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 The consent for this encounter is under Protocol Version Amendment 4 (12Sep2019) and is currently IRB approved. Subject expressed continued interest and consent in continuing as a study subject. Subject confirmed that there was no change in contact information (e.g. address, telephone, email). Subject thanked for participation in research and contribution to science.   During this visiton 12/11/2019, the subject completed the blood work and questionnaires per the above referenced protocol. Please refer to the subject's paper source binder for further details.  Signed by  [place] Assistant [facility]  [place], [place] 1:35 PM 12/11/2019

## 2019-12-12 ENCOUNTER — Encounter (HOSPITAL_COMMUNITY): Payer: Medicare Other

## 2019-12-12 ENCOUNTER — Telehealth (HOSPITAL_COMMUNITY): Payer: Self-pay | Admitting: Internal Medicine

## 2019-12-17 ENCOUNTER — Encounter (HOSPITAL_COMMUNITY)
Admission: RE | Admit: 2019-12-17 | Discharge: 2019-12-17 | Disposition: A | Discharge status: Home / Self Care | Payer: Medicare Other | Source: Ambulatory Visit | Attending: Internal Medicine | Admitting: Internal Medicine

## 2019-12-17 ENCOUNTER — Other Ambulatory Visit: Payer: Self-pay

## 2019-12-17 VITALS — Wt 205.0 lb

## 2019-12-17 DIAGNOSIS — J849 Interstitial pulmonary disease, unspecified: Secondary | ICD-10-CM | POA: Diagnosis present

## 2019-12-17 NOTE — Progress Notes (Signed)
Daily Session Note  Patient Details  Name: [name] MRN: [id number] Date of Birth: 12-10-1945 Referring Provider:     Pulmonary Rehab Walk Test from 11/11/2019 in [place]  Referring Provider  [name]      Encounter Date: 12/17/2019  Check In: Session Check In - 12/17/19 1008      Check-In   Supervising physician immediately available to respond to emergencies  [facility] immediately available    Physician(s)  Dr. Broadus John    Location  [facility]    Staff Present  Rosebud Poles, RN, Bjorn Loser, MS, CEP, Exercise Physiologist;Annedrea Rosezella [place], RN, MHA    Virtual Visit  No    Medication changes reported      No    Fall or balance concerns reported     No    Tobacco Cessation  No Change    Warm-up and Cool-down  Performed as group-led instruction    Resistance Training Performed  Yes    VAD Patient?  No    PAD/SET Patient?  No      Pain Assessment   Currently in Pain?  No/denies    Multiple Pain Sites  No       Capillary Blood Glucose: No results found for this or any previous visit (from the past 24 hour(s)).  Exercise Prescription Changes - 12/17/19 1100      Response to Exercise   Blood Pressure (Admit)  130/70    Blood Pressure (Exercise)  142/70    Blood Pressure (Exit)  122/68    Heart Rate (Admit)  89 bpm    Heart Rate (Exercise)  92 bpm    Heart Rate (Exit)  91 bpm    Oxygen Saturation (Admit)  95 %    Oxygen Saturation (Exercise)  96 %    Oxygen Saturation (Exit)  97 %    Rating of Perceived Exertion (Exercise)  11    Perceived Dyspnea (Exercise)  1    Duration  Continue with 30 min of aerobic exercise without signs/symptoms of physical distress.    Intensity  THRR unchanged      Progression   Progression  Continue to follow PAD protocol      Resistance Training   Training Prescription  Yes    Weight  orange bands    Reps  10-15    Time  10 Minutes      Oxygen   Oxygen   Continuous    Liters  4      NuStep   Level  3    SPM  80    Minutes  30    METs  2.5       Social History   Tobacco Use  Smoking Status Never Smoker  Smokeless Tobacco Never Used  Tobacco Comment   positive passive tobacco smoke exposure    Goals Met:  Independence with exercise equipment Exercise tolerated well Strength training completed today  Goals Unmet:  Not Applicable  Comments: Service time is from 1000 to 1100    Dr. Fransico Him is Medical Director for Cardiac Rehab at [facility].

## 2019-12-19 ENCOUNTER — Encounter (HOSPITAL_COMMUNITY)
Admission: RE | Admit: 2019-12-19 | Discharge: 2019-12-19 | Disposition: A | Discharge status: Home / Self Care | Payer: Medicare Other | Source: Ambulatory Visit | Attending: Internal Medicine | Admitting: Internal Medicine

## 2019-12-19 ENCOUNTER — Other Ambulatory Visit: Payer: Self-pay

## 2019-12-19 DIAGNOSIS — J849 Interstitial pulmonary disease, unspecified: Secondary | ICD-10-CM

## 2019-12-19 NOTE — Progress Notes (Signed)
Daily Session Note  Patient Details  Name: [name] MRN: [id number] Date of Birth: [birth year] Referring Provider:     Pulmonary Rehab Walk Test from 11/11/2019 in [place]  Referring Provider  [name]      Encounter Date: 12/19/2019  Check In: Session Check In - 12/19/19 1149      Check-In   Supervising physician immediately available to respond to emergencies  [facility] immediately available    Physician(s)  Dr. Cyndia Skeeters    Location  [facility]    Staff Present  Rosebud Poles, RN, Bjorn Loser, MS, CEP, Exercise Physiologist;David [facility] MS, EP-C, CCRP    Virtual Visit  No    Medication changes reported      No    Fall or balance concerns reported     No    Tobacco Cessation  No Change    Warm-up and Cool-down  Performed as group-led instruction    Resistance Training Performed  Yes    VAD Patient?  No    PAD/SET Patient?  No      Pain Assessment   Currently in Pain?  No/denies    Multiple Pain Sites  No       Capillary Blood Glucose: No results found for this or any previous visit (from the past 24 hour(s)).    Social History   Tobacco Use  Smoking Status Never Smoker  Smokeless Tobacco Never Used  Tobacco Comment   positive passive tobacco smoke exposure    Goals Met:  Independence with exercise equipment Exercise tolerated well Strength training completed today  Goals Unmet:  Not Applicable  Comments: Service time is from 1000 to 1055    Dr. Fransico Him is Medical Director for Cardiac Rehab at [facility].

## 2019-12-24 ENCOUNTER — Encounter (HOSPITAL_COMMUNITY): Payer: Medicare Other

## 2019-12-26 ENCOUNTER — Encounter (HOSPITAL_COMMUNITY)
Admission: RE | Admit: 2019-12-26 | Discharge: 2019-12-26 | Disposition: A | Discharge status: Home / Self Care | Payer: Medicare Other | Source: Ambulatory Visit | Attending: Internal Medicine | Admitting: Internal Medicine

## 2019-12-26 ENCOUNTER — Other Ambulatory Visit: Payer: Self-pay

## 2019-12-26 DIAGNOSIS — J849 Interstitial pulmonary disease, unspecified: Secondary | ICD-10-CM | POA: Diagnosis not present

## 2019-12-26 NOTE — Progress Notes (Signed)
Daily Session Note  Patient Details  Name: [name] MRN: [id number] Date of Birth: 1946/03/14 Referring Provider:     Pulmonary Rehab Walk Test from 11/11/2019 in [place]  Referring Provider [name]      Encounter Date: 12/26/2019  Check In:  Session Check In - 12/26/19 1013      Check-In   Supervising physician immediately available to respond to emergencies [facility] immediately available    Physician(s) Dr. Cruzita Lederer    Location [facility]    Staff Present Rosebud Poles, RN, Bjorn Loser, MS, CEP, Exercise Physiologist;Tonique Mendonca Ysidro Evert, RN    Virtual Visit No    Medication changes reported     No    Fall or balance concerns reported    No    Tobacco Cessation No Change    Warm-up and Cool-down Performed as group-led instruction    Resistance Training Performed Yes    VAD Patient? No    PAD/SET Patient? No      Pain Assessment   Currently in Pain? No/denies    Multiple Pain Sites No           Capillary Blood Glucose: No results found for this or any previous visit (from the past 24 hour(s)).    Social History   Tobacco Use  Smoking Status Never Smoker  Smokeless Tobacco Never Used  Tobacco Comment   positive passive tobacco smoke exposure    Goals Met:  Exercise tolerated well No report of cardiac concerns or symptoms Strength training completed today  Goals Unmet:  Not Applicable  Comments: Service time is from 1000 to 1102    Dr. Fransico Him is Medical Director for Cardiac Rehab at [facility].

## 2019-12-28 ENCOUNTER — Other Ambulatory Visit: Payer: Self-pay | Admitting: Internal Medicine

## 2019-12-31 ENCOUNTER — Other Ambulatory Visit: Payer: Self-pay

## 2019-12-31 ENCOUNTER — Encounter (HOSPITAL_COMMUNITY)
Admission: RE | Admit: 2019-12-31 | Discharge: 2019-12-31 | Disposition: A | Discharge status: Home / Self Care | Payer: Medicare Other | Source: Ambulatory Visit | Attending: Internal Medicine | Admitting: Internal Medicine

## 2019-12-31 VITALS — Wt 204.8 lb

## 2019-12-31 DIAGNOSIS — J849 Interstitial pulmonary disease, unspecified: Secondary | ICD-10-CM | POA: Diagnosis not present

## 2019-12-31 NOTE — Progress Notes (Signed)
Daily Session Note  Patient Details  Name: [name] MRN: [id number] Date of Birth: [birth year] Referring Provider:     Pulmonary Rehab Walk Test from 11/11/2019 in [place]  Referring Provider [name]      Encounter Date: 12/31/2019  Check In:  Session Check In - 12/31/19 1155      Check-In   Supervising physician immediately available to respond to emergencies [facility] immediately available    Physician(s) Dr. Cruzita Lederer    Location [facility]    Staff Present Rosebud Poles, RN, BSN;Whittaker Lenis Ysidro Evert, RN;Dalton Kris Mouton, MS, CEP, Exercise Physiologist    Virtual Visit No    Medication changes reported     No    Fall or balance concerns reported    No    Tobacco Cessation No Change    Warm-up and Cool-down Performed on first and last piece of equipment    Resistance Training Performed Yes    VAD Patient? No    PAD/SET Patient? No      Pain Assessment   Currently in Pain? No/denies    Multiple Pain Sites No           Capillary Blood Glucose: No results found for this or any previous visit (from the past 24 hour(s)).   Exercise Prescription Changes - 12/31/19 1100      Response to Exercise   Blood Pressure (Admit) 114/62    Blood Pressure (Exercise) 124/68    Blood Pressure (Exit) 126/76    Heart Rate (Admit) 90 bpm    Heart Rate (Exercise) 91 bpm    Heart Rate (Exit) 81 bpm    Oxygen Saturation (Admit) 96 %    Oxygen Saturation (Exercise) 96 %    Oxygen Saturation (Exit) 96 %    Rating of Perceived Exertion (Exercise) 13    Perceived Dyspnea (Exercise) 0    Duration Continue with 30 min of aerobic exercise without signs/symptoms of physical distress.    Intensity THRR unchanged      Progression   Progression Continue to progress workloads to maintain intensity without signs/symptoms of physical distress.      Resistance Training   Training Prescription Yes    Weight orange bands    Reps 10-15     Time 10 Minutes      Oxygen   Oxygen Continuous    Liters 4      NuStep   Level 4    SPM 80    Minutes 30    METs 2.2           Social History   Tobacco Use  Smoking Status Never Smoker  Smokeless Tobacco Never Used  Tobacco Comment   positive passive tobacco smoke exposure    Goals Met:  Exercise tolerated well No report of cardiac concerns or symptoms Strength training completed today  Goals Unmet:  Not Applicable  Comments: Service time is from 1010 to 1117    Dr. Fransico Him is Medical Director for Cardiac Rehab at [facility].

## 2020-01-01 NOTE — Progress Notes (Signed)
Pulmonary Individual Treatment Plan  Patient Details  Name: [name] MRN: [id number] Date of Birth: 1946-04-01 Referring Provider:     Pulmonary Rehab Walk Test from 11/11/2019 in [facility]  Referring Provider [name]      Initial Encounter Date:    Pulmonary Rehab Walk Test from 11/11/2019 in [facility]  Date 11/11/19      Visit Diagnosis: Interstitial lung disease ([place])  Patient's Home Medications on Admission:   Current Outpatient Medications:  .  albuterol (VENTOLIN HFA) 108 (90 Base) MCG/ACT inhaler, USE 2 PUFFS BY MOUTH EVERY  6 HOURS AS NEEDED, Disp: 34 g, Rfl: 2 .  aspirin EC 81 MG tablet, Take 81 mg by mouth daily., Disp: , Rfl:  .  benzonatate (TESSALON) 200 MG capsule, TAKE 1 CAPSULE BY MOUTH 3 TIMES A DAY AS NEEDED FOR COUGH (Patient taking differently: Take 200 mg by mouth 3 (three) times daily as needed for cough. TAKE 1 CAPSULE BY MOUTH 3 TIMES A DAY AS NEEDED FOR COUGH), Disp: 30 capsule, Rfl: 3 .  cetirizine (ZYRTEC) 10 MG tablet, Take 10 mg by mouth daily., Disp: , Rfl:  .  clidinium-chlordiazePOXIDE (LIBRAX) 5-2.5 MG capsule, Take 1 capsule by mouth 2 (two) times daily as needed (for IBS)., Disp: , Rfl:  .  colestipol (COLESTID) 1 G tablet, Take 1 g by mouth 2 (two) times daily. , Disp: , Rfl:  .  dicyclomine (BENTYL) 20 MG tablet, Take 20 mg by mouth 4 (four) times daily -  before meals and at bedtime., Disp: , Rfl:  .  FLUoxetine (PROZAC) 20 MG capsule, Take 20 mg by mouth daily., Disp: , Rfl:  .  fluticasone (FLONASE) 50 MCG/ACT nasal spray, Place 2 sprays into both nostrils daily., Disp: 11.1 mL, Rfl: 6 .  Fluticasone-Umeclidin-Vilant (TRELEGY ELLIPTA) 100-62.5-25 MCG/INH AEPB, Inhale 1 puff into the lungs daily., Disp: 3 each, Rfl: 4 .  furosemide (LASIX) 40 MG tablet, TAKE 1 TABLET BY MOUTH  TWICE DAILY, Disp: 180 tablet, Rfl: 3 .  glycopyrrolate (ROBINUL) 1 MG tablet, Take 1 mg by  mouth 3 (three) times daily as needed (for IBS). , Disp: , Rfl:  .  hydroxychloroquine (PLAQUENIL) 200 MG tablet, Take 2 tablets (400 mg total) by mouth daily. Stop for 2 weeks, post-op., Disp: , Rfl:  .  ipratropium (ATROVENT) 0.03 % nasal spray, USE 1 TO 2 SPRAYS IN BOTH  NOSTRILS TWICE DAILY, Disp: 90 mL, Rfl: 3 .  ipratropium-albuterol (DUONEB) 0.5-2.5 (3) MG/3ML SOLN, USE 1 VIAL IN NEBULIZER EVERY 6 HOURS AS NEEDED *J45.901* npi [id number], Disp: 270 mL, Rfl: 11 .  leflunomide (ARAVA) 20 MG tablet, Take 20 mg by mouth daily. , Disp: , Rfl:  .  methocarbamol (ROBAXIN) 500 MG tablet, Take 1 tablet (500 mg total) by mouth every 6 (six) hours as needed for muscle spasms., Disp: 40 tablet, Rfl: 0 .  mirtazapine (REMERON) 45 MG tablet, Take 45 mg by mouth at bedtime., Disp: , Rfl:  .  montelukast (SINGULAIR) 10 MG tablet, TAKE 1 TABLET BY MOUTH AT  BEDTIME, Disp: 90 tablet, Rfl: 3 .  Multiple Vitamin (MULTIVITAMIN) tablet, Take 1 tablet by mouth daily., Disp: , Rfl:  .  omeprazole (PRILOSEC) 40 MG capsule, Take 40 mg by mouth at bedtime. , Disp: , Rfl:  .  OXYGEN, Inhale 4 mLs into the lungs continuous. , Disp: , Rfl:  .  Pirfenidone (ESBRIET) 801 MG TABS, Take 801  mg by mouth 3 (three) times daily., Disp: 90 tablet, Rfl: 11 .  potassium chloride SA (KLOR-CON) 20 MEQ tablet, TAKE 1 TABLET BY MOUTH  TWICE DAILY, Disp: 180 tablet, Rfl: 3 .  predniSONE (DELTASONE) 5 MG tablet, Take 5 mg by mouth daily with breakfast., Disp: , Rfl:  .  rosuvastatin (CRESTOR) 20 MG tablet, Take 1 tablet (20 mg total) by mouth daily., Disp: 90 tablet, Rfl: 3 .  verapamil (CALAN-SR) 240 MG CR tablet, TAKE 1 TABLET BY MOUTH  DAILY, Disp: 90 tablet, Rfl: 3  Past Medical History: Past Medical History:  Diagnosis Date  . Acute asthmatic bronchitis   . Allergic rhinitis   . Anxiety   . Arthritis   . Esophageal reflux   . Hypertension   . Interstitial lung disease ([place]) dx jan 2020  . Irritable bowel syndrome   .  Oxygen dependent    4 liters day time 6 liters at night  . PONV (postoperative nausea and vomiting)    ponv likes zofran, and scopolamine patch  . Rheumatoid arthritis(714.0)   . Sleep apnea     Tobacco Use: Social History   Tobacco Use  Smoking Status Never Smoker  Smokeless Tobacco Never Used  Tobacco Comment   positive passive tobacco smoke exposure    Labs: Recent Review Flowsheet Data    Labs for ITP Cardiac and Pulmonary Rehab Latest Ref Rng & Units 11/15/2012 09/16/2016 09/16/2016 09/16/2016 01/09/2017   Cholestrol 100 - 199 mg/dL - - - - 127   LDLCALC 0 - 99 mg/dL - - - - 38   LDLDIRECT 0 - 99 mg/dL - - - - 53   HDL >39 mg/dL - - - - 53   Trlycerides 0 - 149 mg/dL - - - - 181(H)   Hemoglobin A1c 4.8 - 5.6 % 6.0(H) - - - -   PHART 7.35 - 7.45 - - 7.366 - -   PCO2ART 32 - 48 mmHg - - 38.3 - -   HCO3 20.0 - 28.0 mmol/L - 23.7 21.9 23.3 -   TCO2 0 - 100 mmol/L - _0 -   ACIDBASEDEF 0.0 - 2.0 mmol/L - 1.0 3.0(H) 2.0 -   O2SAT % - 70.0 94.0 75.0 -      Capillary Blood Glucose: Lab Results  Component Value Date   GLUCAP 87 08/01/2013   GLUCAP 81 08/01/2013   GLUCAP 156 (H) 09/11/2010     Pulmonary Assessment Scores:  Pulmonary Assessment Scores    Row Name 11/11/19 1102         ADL UCSD   ADL Phase Entry     SOB Score total 55       CAT Score   CAT Score 17           UCSD: Self-administered rating of dyspnea associated with activities of daily living (ADLs) 6-point scale (0 = "not at all" to 5 = "maximal or unable to do because of breathlessness")  Scoring Scores range from 0 to 120.  Minimally important difference is 5 units  CAT: CAT can identify the health impairment of COPD patients and is better correlated with disease progression.  CAT has a scoring range of zero to 40. The CAT score is classified into four groups of low (less than 10), medium (10 - 20), high (21-30) and very high (31-40) based on the impact level of disease on health status. A  CAT score over 10 suggests significant symptoms.  A worsening CAT  score could be explained by an exacerbation, poor medication adherence, poor inhaler technique, or progression of COPD or comorbid conditions.  CAT MCID is 2 points  mMRC: mMRC (Modified Medical Research Council) Dyspnea Scale is used to assess the degree of baseline functional disability in patients of respiratory disease due to dyspnea. No minimal important difference is established. A decrease in score of 1 point or greater is considered a positive change.   Pulmonary Function Assessment:  Pulmonary Function Assessment - 11/11/19 1100      Breath   Bilateral Breath Sounds Clear    Shortness of Breath Yes;Limiting activity;Fear of Shortness of Breath           Exercise Target Goals: Exercise Program Goal: Individual exercise prescription set using results from initial 6 min walk test and THRR while considering  patient's activity barriers and safety.   Exercise Prescription Goal: Initial exercise prescription builds to 30-45 minutes a day of aerobic activity, 2-3 days per week.  Home exercise guidelines will be given to patient during program as part of exercise prescription that the participant will acknowledge.  Activity Barriers & Risk Stratification:  Activity Barriers & Cardiac Risk Stratification - 11/11/19 1052      Activity Barriers & Cardiac Risk Stratification   Activity Barriers Arthritis;Right Hip Replacement;Left Knee Replacement;Right Knee Replacement;Joint Problems;Deconditioning;Muscular Weakness;Shortness of Breath;History of Falls;Assistive Device           6 Minute Walk:  6 Minute Walk    Row Name 11/11/19 1209         6 Minute Walk   Distance 658 feet     Walk Time 5.5 minutes     # of Rest Breaks 1     MPH 1.24     METS 1.29     RPE 15     Perceived Dyspnea  2     VO2 Peak 4.5     Symptoms Yes (comment)     Comments used wheelchair. One seated rest break for 30 sec due to SOB       Resting HR 78 bpm     Resting BP 120/70     Resting Oxygen Saturation  97 %     Exercise Oxygen Saturation  during 6 min walk 90 %     Max Ex. HR 131 bpm     Max Ex. BP 144/88     2 Minute Post BP 124/80       Interval HR   1 Minute HR 99     2 Minute HR 112     3 Minute HR 120     4 Minute HR 128     5 Minute HR 125     6 Minute HR 131     2 Minute Post HR 111     Interval Heart Rate? Yes       Interval Oxygen   Interval Oxygen? Yes     Baseline Oxygen Saturation % 97 %     1 Minute Oxygen Saturation % 94 %     1 Minute Liters of Oxygen 4 L     2 Minute Oxygen Saturation % 93 %     2 Minute Liters of Oxygen 4 L     3 Minute Oxygen Saturation % 93 %     3 Minute Liters of Oxygen 4 L     4 Minute Oxygen Saturation % 92 %     4 Minute Liters of Oxygen 4 L  5 Minute Oxygen Saturation % 92 %     5 Minute Liters of Oxygen 4 L     6 Minute Oxygen Saturation % 90 %     6 Minute Liters of Oxygen 4 L     2 Minute Post Oxygen Saturation % 97 %     2 Minute Post Liters of Oxygen 4 L            Oxygen Initial Assessment:  Oxygen Initial Assessment - 11/11/19 1209      Home Oxygen   Home Oxygen Device Home Concentrator;Portable Concentrator    Sleep Oxygen Prescription Continuous    Liters per minute 4    Home Exercise Oxygen Prescription Continuous    Liters per minute 4    Home at Rest Exercise Oxygen Prescription Continuous    Liters per minute 4    Compliance with Home Oxygen Use Yes      Initial 6 min Walk   Oxygen Used Continuous    Liters per minute 4      Program Oxygen Prescription   Program Oxygen Prescription Continuous    Liters per minute 4      Intervention   Short Term Goals To learn and exhibit compliance with exercise, home and travel O2 prescription;To learn and understand importance of monitoring SPO2 with pulse oximeter and demonstrate accurate use of the pulse oximeter.;To learn and understand importance of maintaining oxygen  saturations>88%;To learn and demonstrate proper pursed lip breathing techniques or other breathing techniques.;To learn and demonstrate proper use of respiratory medications    Long  Term Goals Exhibits compliance with exercise, home and travel O2 prescription;Verbalizes importance of monitoring SPO2 with pulse oximeter and return demonstration;Maintenance of O2 saturations>88%;Compliance with respiratory medication;Exhibits proper breathing techniques, such as pursed lip breathing or other method taught during program session;Demonstrates proper use of MDI's           Oxygen Re-Evaluation:  Oxygen Re-Evaluation    Row Name 12/03/19 0716 12/31/19 1502           Program Oxygen Prescription   Program Oxygen Prescription Continuous Continuous      Liters per minute 4 4        Home Oxygen   Home Oxygen Device Home Concentrator;Portable Concentrator Home Concentrator;Portable Concentrator      Sleep Oxygen Prescription Continuous Continuous      Liters per minute 4 4      Home Exercise Oxygen Prescription Continuous Continuous      Liters per minute 4 4      Home at Rest Exercise Oxygen Prescription Continuous Continuous      Liters per minute 4 4      Compliance with Home Oxygen Use -- Yes        Goals/Expected Outcomes   Short Term Goals To learn and exhibit compliance with exercise, home and travel O2 prescription;To learn and understand importance of monitoring SPO2 with pulse oximeter and demonstrate accurate use of the pulse oximeter.;To learn and understand importance of maintaining oxygen saturations>88%;To learn and demonstrate proper pursed lip breathing techniques or other breathing techniques.;To learn and demonstrate proper use of respiratory medications To learn and exhibit compliance with exercise, home and travel O2 prescription;To learn and understand importance of monitoring SPO2 with pulse oximeter and demonstrate accurate use of the pulse oximeter.;To learn and  understand importance of maintaining oxygen saturations>88%;To learn and demonstrate proper pursed lip breathing techniques or other breathing techniques.;To learn and demonstrate proper use of respiratory medications  Long  Term Goals Exhibits compliance with exercise, home and travel O2 prescription;Verbalizes importance of monitoring SPO2 with pulse oximeter and return demonstration;Maintenance of O2 saturations>88%;Compliance with respiratory medication;Exhibits proper breathing techniques, such as pursed lip breathing or other method taught during program session;Demonstrates proper use of MDI's Exhibits compliance with exercise, home and travel O2 prescription;Verbalizes importance of monitoring SPO2 with pulse oximeter and return demonstration;Maintenance of O2 saturations>88%;Compliance with respiratory medication;Exhibits proper breathing techniques, such as pursed lip breathing or other method taught during program session;Demonstrates proper use of MDI's      Goals/Expected Outcomes compliance  compliance              Oxygen Discharge (Final Oxygen Re-Evaluation):  Oxygen Re-Evaluation - 12/31/19 1502      Program Oxygen Prescription   Program Oxygen Prescription Continuous    Liters per minute 4      Home Oxygen   Home Oxygen Device Home Concentrator;Portable Concentrator    Sleep Oxygen Prescription Continuous    Liters per minute 4    Home Exercise Oxygen Prescription Continuous    Liters per minute 4    Home at Rest Exercise Oxygen Prescription Continuous    Liters per minute 4    Compliance with Home Oxygen Use Yes      Goals/Expected Outcomes   Short Term Goals To learn and exhibit compliance with exercise, home and travel O2 prescription;To learn and understand importance of monitoring SPO2 with pulse oximeter and demonstrate accurate use of the pulse oximeter.;To learn and understand importance of maintaining oxygen saturations>88%;To learn and demonstrate proper  pursed lip breathing techniques or other breathing techniques.;To learn and demonstrate proper use of respiratory medications    Long  Term Goals Exhibits compliance with exercise, home and travel O2 prescription;Verbalizes importance of monitoring SPO2 with pulse oximeter and return demonstration;Maintenance of O2 saturations>88%;Compliance with respiratory medication;Exhibits proper breathing techniques, such as pursed lip breathing or other method taught during program session;Demonstrates proper use of MDI's    Goals/Expected Outcomes compliance            Initial Exercise Prescription:  Initial Exercise Prescription - 11/11/19 1200      Date of Initial Exercise RX and Referring Provider   Date 11/11/19    Referring Provider [name]      Oxygen   Oxygen Continuous    Liters 4      NuStep   Level 2    SPM 80    Minutes 30      Prescription Details   Frequency (times per week) 2    Duration Progress to 30 minutes of continuous aerobic without signs/symptoms of physical distress      Intensity   THRR 40-80% of Max Heartrate 62-124    Ratings of Perceived Exertion 11-13    Perceived Dyspnea 0-4      Progression   Progression Continue to progress workloads to maintain intensity without signs/symptoms of physical distress.      Resistance Training   Training Prescription Yes    Weight orange bands    Reps 10-15           Perform Capillary Blood Glucose checks as needed.  Exercise Prescription Changes:  Exercise Prescription Changes    Row Name 11/19/19 1100 12/03/19 1100 12/05/19 1200 12/17/19 1100 12/31/19 1100     Response to Exercise   Blood Pressure (Admit) 140/80 134/70 -- 130/70 114/62   Blood Pressure (Exercise) 134/78 126/60 -- 142/70 124/68   Blood Pressure (Exit) 126/64  116/80 -- 122/68 126/76   Heart Rate (Admit) 79 bpm 89 bpm -- 89 bpm 90 bpm   Heart Rate (Exercise) 94 bpm 86 bpm -- 92 bpm 91 bpm   Heart Rate (Exit) 88 bpm 88 bpm -- 91 bpm 81  bpm   Oxygen Saturation (Admit) 98 % 95 % -- 95 % 96 %   Oxygen Saturation (Exercise) 98 % 94 % -- 96 % 96 %   Oxygen Saturation (Exit) 98 % 97 % -- 97 % 96 %   Rating of Perceived Exertion (Exercise) 12 11 -- 11 13   Perceived Dyspnea (Exercise) 0 1 -- 1 0   Duration Progress to 30 minutes of  aerobic without signs/symptoms of physical distress Continue with 30 min of aerobic exercise without signs/symptoms of physical distress. -- Continue with 30 min of aerobic exercise without signs/symptoms of physical distress. Continue with 30 min of aerobic exercise without signs/symptoms of physical distress.   Intensity Other (comment)  40-80 % of HRR THRR unchanged -- THRR unchanged THRR unchanged     Progression   Progression Continue to progress workloads to maintain intensity without signs/symptoms of physical distress. Continue to follow PAD protocol -- Continue to follow PAD protocol Continue to progress workloads to maintain intensity without signs/symptoms of physical distress.     Resistance Training   Training Prescription Yes Yes -- Yes Yes   Weight orange bands orange bands -- orange bands orange bands   Reps 10-15 10-15 -- 10-15 10-15   Time 10 Minutes 10 Minutes -- 10 Minutes 10 Minutes     Oxygen   Oxygen Continuous Continuous -- Continuous Continuous   Liters 4 4 -- 4 4     NuStep   Level 2 3 -- 3 4   SPM 80 80 -- 80 80   Minutes 30 30 -- 30 30   METs 1.6 2 -- 2.5 2.2     Home Exercise Plan   Plans to continue exercise at -- -- Home (comment) -- --   Frequency -- -- Add 2 additional days to program exercise sessions. -- --   Initial Home Exercises Provided -- -- 12/05/19 -- --          Exercise Comments:  Exercise Comments    Row Name 12/05/19 1202           Exercise Comments home exercise complete              Exercise Goals and Review:  Exercise Goals    Row Name 11/11/19 1213 12/03/19 0716 12/31/19 1503         Exercise Goals   Increase Physical  Activity Yes Yes Yes     Intervention Provide advice, education, support and counseling about physical activity/exercise needs.;Develop an individualized exercise prescription for aerobic and resistive training based on initial evaluation findings, risk stratification, comorbidities and participant's personal goals. Provide advice, education, support and counseling about physical activity/exercise needs.;Develop an individualized exercise prescription for aerobic and resistive training based on initial evaluation findings, risk stratification, comorbidities and participant's personal goals. Provide advice, education, support and counseling about physical activity/exercise needs.;Develop an individualized exercise prescription for aerobic and resistive training based on initial evaluation findings, risk stratification, comorbidities and participant's personal goals.     Expected Outcomes Short Term: Attend rehab on a regular basis to increase amount of physical activity.;Long Term: Add in home exercise to make exercise part of routine and to increase amount of physical activity.;Long Term: Exercising regularly at  least 3-5 days a week. Short Term: Attend rehab on a regular basis to increase amount of physical activity.;Long Term: Add in home exercise to make exercise part of routine and to increase amount of physical activity.;Long Term: Exercising regularly at least 3-5 days a week. Short Term: Attend rehab on a regular basis to increase amount of physical activity.;Long Term: Add in home exercise to make exercise part of routine and to increase amount of physical activity.;Long Term: Exercising regularly at least 3-5 days a week.     Increase Strength and Stamina Yes Yes Yes     Intervention Provide advice, education, support and counseling about physical activity/exercise needs.;Develop an individualized exercise prescription for aerobic and resistive training based on initial evaluation findings, risk  stratification, comorbidities and participant's personal goals. Provide advice, education, support and counseling about physical activity/exercise needs.;Develop an individualized exercise prescription for aerobic and resistive training based on initial evaluation findings, risk stratification, comorbidities and participant's personal goals. Provide advice, education, support and counseling about physical activity/exercise needs.;Develop an individualized exercise prescription for aerobic and resistive training based on initial evaluation findings, risk stratification, comorbidities and participant's personal goals.     Expected Outcomes Short Term: Increase workloads from initial exercise prescription for resistance, speed, and METs.;Long Term: Improve cardiorespiratory fitness, muscular endurance and strength as measured by increased METs and functional capacity (6MWT);Short Term: Perform resistance training exercises routinely during rehab and add in resistance training at home Short Term: Increase workloads from initial exercise prescription for resistance, speed, and METs.;Long Term: Improve cardiorespiratory fitness, muscular endurance and strength as measured by increased METs and functional capacity (6MWT);Short Term: Perform resistance training exercises routinely during rehab and add in resistance training at home Short Term: Increase workloads from initial exercise prescription for resistance, speed, and METs.;Long Term: Improve cardiorespiratory fitness, muscular endurance and strength as measured by increased METs and functional capacity (6MWT);Short Term: Perform resistance training exercises routinely during rehab and add in resistance training at home     Able to understand and use rate of perceived exertion (RPE) scale Yes Yes Yes     Intervention Provide education and explanation on how to use RPE scale Provide education and explanation on how to use RPE scale Provide education and explanation  on how to use RPE scale     Expected Outcomes Short Term: Able to use RPE daily in rehab to express subjective intensity level;Long Term:  Able to use RPE to guide intensity level when exercising independently Short Term: Able to use RPE daily in rehab to express subjective intensity level;Long Term:  Able to use RPE to guide intensity level when exercising independently Short Term: Able to use RPE daily in rehab to express subjective intensity level;Long Term:  Able to use RPE to guide intensity level when exercising independently     Able to understand and use Dyspnea scale Yes Yes Yes     Intervention Provide education and explanation on how to use Dyspnea scale Provide education and explanation on how to use Dyspnea scale Provide education and explanation on how to use Dyspnea scale     Expected Outcomes Short Term: Able to use Dyspnea scale daily in rehab to express subjective sense of shortness of breath during exertion;Long Term: Able to use Dyspnea scale to guide intensity level when exercising independently Short Term: Able to use Dyspnea scale daily in rehab to express subjective sense of shortness of breath during exertion;Long Term: Able to use Dyspnea scale to guide intensity level when exercising independently  Short Term: Able to use Dyspnea scale daily in rehab to express subjective sense of shortness of breath during exertion;Long Term: Able to use Dyspnea scale to guide intensity level when exercising independently     Knowledge and understanding of Target Heart Rate Range (THRR) Yes Yes Yes     Intervention Provide education and explanation of THRR including how the numbers were predicted and where they are located for reference Provide education and explanation of THRR including how the numbers were predicted and where they are located for reference Provide education and explanation of THRR including how the numbers were predicted and where they are located for reference     Expected  Outcomes Short Term: Able to state/look up THRR;Short Term: Able to use daily as guideline for intensity in rehab;Long Term: Able to use THRR to govern intensity when exercising independently Short Term: Able to state/look up THRR;Short Term: Able to use daily as guideline for intensity in rehab;Long Term: Able to use THRR to govern intensity when exercising independently Short Term: Able to state/look up THRR;Short Term: Able to use daily as guideline for intensity in rehab;Long Term: Able to use THRR to govern intensity when exercising independently     Understanding of Exercise Prescription Yes Yes Yes     Intervention Provide education, explanation, and written materials on patient's individual exercise prescription Provide education, explanation, and written materials on patient's individual exercise prescription Provide education, explanation, and written materials on patient's individual exercise prescription     Expected Outcomes Short Term: Able to explain program exercise prescription;Long Term: Able to explain home exercise prescription to exercise independently Short Term: Able to explain program exercise prescription;Long Term: Able to explain home exercise prescription to exercise independently Short Term: Able to explain program exercise prescription;Long Term: Able to explain home exercise prescription to exercise independently            Exercise Goals Re-Evaluation :  Exercise Goals Re-Evaluation    Row Name 12/03/19 0717 12/31/19 1503           Exercise Goal Re-Evaluation   Exercise Goals Review Increase Physical Activity;Increase Strength and Stamina;Able to understand and use rate of perceived exertion (RPE) scale;Able to understand and use Dyspnea scale;Knowledge and understanding of Target Heart Rate Range (THRR);Understanding of Exercise Prescription Increase Physical Activity;Increase Strength and Stamina;Able to understand and use rate of perceived exertion (RPE) scale;Able  to understand and use Dyspnea scale;Knowledge and understanding of Target Heart Rate Range (THRR);Understanding of Exercise Prescription      Comments Pt has completed 4 exercise sessions. Pt is limited by severe deconditioning and several orthopedic issues. I expect progress will be slow, but pt is motivated. She currently exercises at 2.0 METs on the stepper. Pt has completed 11 exercise sessions. Pt has progressed much better than I expected. She has purchased a stepper so she can exercise at home when she graduates. She currently exercises at 2.2 METs on the stepper. Will continue to monitor and progress as able.      Expected Outcomes Through exercise at rehab and at home, the patient will decrease shortness of breath with daily activities and feel confident in carrying out an exercise regime at home.  Through exercise at rehab and at home, the patient will decrease shortness of breath with daily activities and feel confident in carrying out an exercise regime at home.              Discharge Exercise Prescription (Final Exercise Prescription Changes):  Exercise Prescription  Changes - 12/31/19 1100      Response to Exercise   Blood Pressure (Admit) 114/62    Blood Pressure (Exercise) 124/68    Blood Pressure (Exit) 126/76    Heart Rate (Admit) 90 bpm    Heart Rate (Exercise) 91 bpm    Heart Rate (Exit) 81 bpm    Oxygen Saturation (Admit) 96 %    Oxygen Saturation (Exercise) 96 %    Oxygen Saturation (Exit) 96 %    Rating of Perceived Exertion (Exercise) 13    Perceived Dyspnea (Exercise) 0    Duration Continue with 30 min of aerobic exercise without signs/symptoms of physical distress.    Intensity THRR unchanged      Progression   Progression Continue to progress workloads to maintain intensity without signs/symptoms of physical distress.      Resistance Training   Training Prescription Yes    Weight orange bands    Reps 10-15    Time 10 Minutes      Oxygen   Oxygen  Continuous    Liters 4      NuStep   Level 4    SPM 80    Minutes 30    METs 2.2           Nutrition:  Target Goals: Understanding of nutrition guidelines, daily intake of sodium <1534m, cholesterol <2057m calories 30% from fat and 7% or less from saturated fats, daily to have 5 or more servings of fruits and vegetables.  Biometrics:  Pre Biometrics - 11/11/19 1053      Pre Biometrics   Grip Strength 15 kg            Nutrition Therapy Plan and Nutrition Goals:  Nutrition Therapy & Goals - 11/21/19 1203      Nutrition Therapy   Diet Generally Healthful      Personal Nutrition Goals   Nutrition Goal Pt to build a healthy plate including vegetables, fruits, whole grains, and low-fat dairy products in a heart healthy meal plan.    Personal Goal #2 Pt to identify food quantities necessary to achieve weight loss of 6-24 lb at graduation from pulmonary rehab.      Intervention Plan   Intervention Nutrition handout(s) given to patient.;Prescribe, educate and counsel regarding individualized specific dietary modifications aiming towards targeted core components such as weight, hypertension, lipid management, diabetes, heart failure and other comorbidities.    Expected Outcomes Short Term Goal: A plan has been developed with personal nutrition goals set during dietitian appointment.           Nutrition Assessments:   Nutrition Goals Re-Evaluation:  Nutrition Goals Re-Evaluation    [place] 11/21/19 1204 12/31/19 0849           Goals   Current Weight 204 lb (92.5 kg) 205 lb (93 kg)      Nutrition Goal Pt to build a healthy plate including vegetables, fruits, whole grains, and low-fat dairy products in a heart healthy meal plan. Pt to build a healthy plate including vegetables, fruits, whole grains, and low-fat dairy products in a heart healthy meal plan.      Comment -- No weight loss        Personal Goal #2 Re-Evaluation   Personal Goal #2 Pt to identify food  quantities necessary to achieve weight loss of 6-24 lb at graduation from pulmonary rehab. Pt to identify food quantities necessary to achieve weight loss of 6-24 lb at graduation from pulmonary rehab.  Nutrition Goals Discharge (Final Nutrition Goals Re-Evaluation):  Nutrition Goals Re-Evaluation - 12/31/19 0849      Goals   Current Weight 205 lb (93 kg)    Nutrition Goal Pt to build a healthy plate including vegetables, fruits, whole grains, and low-fat dairy products in a heart healthy meal plan.    Comment No weight loss      Personal Goal #2 Re-Evaluation   Personal Goal #2 Pt to identify food quantities necessary to achieve weight loss of 6-24 lb at graduation from pulmonary rehab.           Psychosocial: Target Goals: Acknowledge presence or absence of significant depression and/or stress, maximize coping skills, provide positive support system. Participant is able to verbalize types and ability to use techniques and skills needed for reducing stress and depression.  Initial Review & Psychosocial Screening:  Initial Psych Review & Screening - 11/11/19 1104      Initial Review   Current issues with History of Depression;Current Depression;Current Stress Concerns    Source of Stress Concerns Chronic Illness;Unable to participate in former interests or hobbies;Unable to perform yard/household activities      [place]? Yes      Barriers   Psychosocial barriers to participate in program The patient should benefit from training in stress management and relaxation.      Screening Interventions   Interventions Encouraged to exercise           Quality of Life Scores:  Scores of 19 and below usually indicate a poorer quality of life in these areas.  A difference of  2-3 points is a clinically meaningful difference.  A difference of 2-3 points in the total score of the Quality of Life Index has been associated with significant  improvement in overall quality of life, self-image, physical symptoms, and general health in studies assessing change in quality of life.  PHQ-9: Recent Review Flowsheet Data    Depression screen [facility] 2/9 11/11/2019 09/10/2018 05/09/2018   Decreased Interest 0 0 0   Down, Depressed, Hopeless 0 1 0   PHQ - 2 Score 0 1 0   Altered sleeping 0 3 -   Tired, decreased energy 3 3 -   Change in appetite 0 0 -   Feeling bad or failure about yourself  0 1 -   Trouble concentrating 0 0 -   Moving slowly or fidgety/restless 0 0 -   Suicidal thoughts 0 0 -   Difficult doing work/chores Somewhat difficult Somewhat difficult -     Interpretation of Total Score  Total Score Depression Severity:  1-4 = Minimal depression, 5-9 = Mild depression, 10-14 = Moderate depression, 15-19 = Moderately severe depression, 20-27 = Severe depression   Psychosocial Evaluation and Intervention:  Psychosocial Evaluation - 11/11/19 1105      Psychosocial Evaluation & Interventions   Interventions Stress management education;Relaxation education;Encouraged to exercise with the program and follow exercise prescription    Comments Depression due to unable to participate in activities she used to do    Expected Outcomes To handle her depression in healthy manners.    Continue Psychosocial Services  Follow up required by staff           Psychosocial Re-Evaluation:  Psychosocial Re-Evaluation    [place] Name 12/03/19 0827 12/30/19 0934 12/30/19 0935         Psychosocial Re-Evaluation   Current issues with Current Depression;History of Depression;Current Stress Concerns History of Depression;Current Depression  Current Depression;History of Depression;Current Stress Concerns     Comments Beanca has just started the program, has attended 4 exercise sessions and is doing well.  She is pleased that she is able to tolerate the exercise despite her rheumatoid arthritis, her current depression with a history of depression is  stable at this time. -- Is handling her chronic illnesses as well as possible, has enjoyed exercising on the nustep and is investigating purchasing a nustep exercise machinefor home use after graduation from program.  Depression and stress is stable at this point.     Expected Outcomes For Arlyss to continue without barriers or psychosocial concerns while participating in pulmonary rehab. -- For Marny to handle stress in healthy ways.     Interventions Encouraged to attend Pulmonary Rehabilitation for the exercise;Stress management education;Relaxation education -- Stress management education;Relaxation education;Encouraged to attend Pulmonary Rehabilitation for the exercise     Continue Psychosocial Services  Follow up required by staff -- No Follow up required     Comments Seems to be handling strssors in healthy ways. -- ZHusband and family are supportive.       Initial Review   Source of Stress Concerns Chronic Illness;Unable to participate in former interests or hobbies;Unable to perform yard/household activities -- Chronic Illness;Unable to participate in former interests or hobbies;Unable to perform yard/household activities            Psychosocial Discharge (Final Psychosocial Re-Evaluation):  Psychosocial Re-Evaluation - 12/30/19 0935      Psychosocial Re-Evaluation   Current issues with Current Depression;History of Depression;Current Stress Concerns    Comments Is handling her chronic illnesses as well as possible, has enjoyed exercising on the nustep and is investigating purchasing a nustep exercise machinefor home use after graduation from program.  Depression and stress is stable at this point.    Expected Outcomes For Vanessia to handle stress in healthy ways.    Interventions Stress management education;Relaxation education;Encouraged to attend Pulmonary Rehabilitation for the exercise    Continue Psychosocial Services  No Follow up required    Comments ZHusband and family are  supportive.      Initial Review   Source of Stress Concerns Chronic Illness;Unable to participate in former interests or hobbies;Unable to perform yard/household activities           Education: Education Goals: Education classes will be provided on a weekly basis, covering required topics. Participant will state understanding/return demonstration of topics presented.  Learning Barriers/Preferences:  Learning Barriers/Preferences - 11/11/19 1107      Learning Barriers/Preferences   Learning Barriers None    Learning Preferences Audio;Computer/Internet;Skilled Demonstration;Pictoral;Verbal Instruction;Video;Written Material           Education Topics: Risk Factor Reduction:  -Group instruction that is supported by a PowerPoint presentation. Instructor discusses the definition of a risk factor, different risk factors for pulmonary disease, and how the heart and lungs work together.     Nutrition for Pulmonary Patient:  -Group instruction provided by PowerPoint slides, verbal discussion, and written materials to support subject matter. The instructor gives an explanation and review of healthy diet recommendations, which includes a discussion on weight management, recommendations for fruit and vegetable consumption, as well as protein, fluid, caffeine, fiber, sodium, sugar, and alcohol. Tips for eating when patients are short of breath are discussed.   Pursed Lip Breathing:  -Group instruction that is supported by demonstration and informational handouts. Instructor discusses the benefits of pursed lip and diaphragmatic breathing and detailed demonstration on how to preform  both.     Oxygen Safety:  -Group instruction provided by PowerPoint, verbal discussion, and written material to support subject matter. There is an overview of "What is Oxygen" and "Why do we need it".  Instructor also reviews how to create a safe environment for oxygen use, the importance of using oxygen as  prescribed, and the risks of noncompliance. There is a brief discussion on traveling with oxygen and resources the patient may utilize.   Oxygen Equipment:  -Group instruction provided by [facility] Staff utilizing handouts, written materials, and equipment demonstrations.   Signs and Symptoms:  -Group instruction provided by written material and verbal discussion to support subject matter. Warning signs and symptoms of infection, stroke, and heart attack are reviewed and when to call the physician/911 reinforced. Tips for preventing the spread of infection discussed.   Advanced Directives:  -Group instruction provided by verbal instruction and written material to support subject matter. Instructor reviews Advanced Directive laws and proper instruction for filling out document.   Pulmonary Video:  -Group video education that reviews the importance of medication and oxygen compliance, exercise, good nutrition, pulmonary hygiene, and pursed lip and diaphragmatic breathing for the pulmonary patient.   Exercise for the Pulmonary Patient:  -Group instruction that is supported by a PowerPoint presentation. Instructor discusses benefits of exercise, core components of exercise, frequency, duration, and intensity of an exercise routine, importance of utilizing pulse oximetry during exercise, safety while exercising, and options of places to exercise outside of rehab.     Pulmonary Medications:  -Verbally interactive group education provided by instructor with focus on inhaled medications and proper administration.   Anatomy and Physiology of the Respiratory System and Intimacy:  -Group instruction provided by PowerPoint, verbal discussion, and written material to support subject matter. Instructor reviews respiratory cycle and anatomical components of the respiratory system and their functions. Instructor also reviews differences in obstructive and restrictive respiratory diseases with examples  of each. Intimacy, Sex, and Sexuality differences are reviewed with a discussion on how relationships can change when diagnosed with pulmonary disease. Common sexual concerns are reviewed.   PULMONARY REHAB OTHER RESPIRATORY from 12/17/2019 in [place]  Date 12/17/19  [facility] level ]  Educator DF  Instruction Review Code 2- Demonstrated Understanding      MD DAY -A group question and answer session with a medical doctor that allows participants to ask questions that relate to their pulmonary disease state.   OTHER EDUCATION -Group or individual verbal, written, or video instructions that support the educational goals of the pulmonary rehab program.   PULMONARY REHAB OTHER RESPIRATORY from 12/17/2019 in [place]  Date 11/19/19  Educator Handout  [Know your numbers]      Holiday Eating Survival Tips:  -Group instruction provided by PowerPoint slides, verbal discussion, and written materials to support subject matter. The instructor gives patients tips, tricks, and techniques to help them not only survive but enjoy the holidays despite the onslaught of food that accompanies the holidays.   Knowledge Questionnaire Score:  Knowledge Questionnaire Score - 11/11/19 1206      Knowledge Questionnaire Score   Pre Score 14/18           Core Components/Risk Factors/Patient Goals at Admission:  Personal Goals and Risk Factors at Admission - 11/11/19 1107      Core Components/Risk Factors/Patient Goals on Admission    Weight Management Obesity    Improve shortness of breath with ADL's Yes  Intervention Provide education, individualized exercise plan and daily activity instruction to help decrease symptoms of SOB with activities of daily living.    Expected Outcomes Short Term: Improve cardiorespiratory fitness to achieve a reduction of symptoms when performing ADLs;Long Term: Be able to perform more ADLs without symptoms or  delay the onset of symptoms    Stress Yes    Intervention Offer individual and/or small group education and counseling on adjustment to heart disease, stress management and health-related lifestyle change. Teach and support self-help strategies.;Refer participants experiencing significant psychosocial distress to appropriate mental health specialists for further evaluation and treatment. When possible, include family members and significant others in education/counseling sessions.    Expected Outcomes Short Term: Participant demonstrates changes in health-related behavior, relaxation and other stress management skills, ability to obtain effective social support, and compliance with psychotropic medications if prescribed.;Long Term: Emotional wellbeing is indicated by absence of clinically significant psychosocial distress or social isolation.           Core Components/Risk Factors/Patient Goals Review:   Goals and Risk Factor Review    Row Name 11/11/19 1109 12/03/19 0831 12/30/19 5208         Core Components/Risk Factors/Patient Goals Review   Personal Goals Review Develop more efficient breathing techniques such as purse lipped breathing and diaphragmatic breathing and practicing self-pacing with activity.;Increase knowledge of respiratory medications and ability to use respiratory devices properly.;Improve shortness of breath with ADL's;Stress;Weight Management/Obesity Improve shortness of breath with ADL's;Develop more efficient breathing techniques such as purse lipped breathing and diaphragmatic breathing and practicing self-pacing with activity.;Increase knowledge of respiratory medications and ability to use respiratory devices properly.;Stress Increase knowledge of respiratory medications and ability to use respiratory devices properly.;Improve shortness of breath with ADL's;Develop more efficient breathing techniques such as purse lipped breathing and diaphragmatic breathing and practicing  self-pacing with activity.     Review -- Has exercised in 4 sessions, is doing well, is on level 3 of the nustep for 30 minutes with a met level of 2.0.  No problems identified at this point. Verdine has done suprizingly well despite her rheumatoid arthritis and interstitial lung disease, she is exercising on level 4 on the nustep for 30 minutes at 2.7 mets.  Very proud of her!!!     Expected Outcomes -- See admission goals. See admission goals.            Core Components/Risk Factors/Patient Goals at Discharge (Final Review):   Goals and Risk Factor Review - 12/30/19 0938      Core Components/Risk Factors/Patient Goals Review   Personal Goals Review Increase knowledge of respiratory medications and ability to use respiratory devices properly.;Improve shortness of breath with ADL's;Develop more efficient breathing techniques such as purse lipped breathing and diaphragmatic breathing and practicing self-pacing with activity.    Review Adessa has done suprizingly well despite her rheumatoid arthritis and interstitial lung disease, she is exercising on level 4 on the nustep for 30 minutes at 2.7 mets.  Very proud of her!!!    Expected Outcomes See admission goals.           ITP Comments:   Comments: ITP REVIEW Pt is making expected progress toward pulmonary rehab goals after completing 11 sessions. Recommend continued exercise, life style modification, education, and utilization of breathing techniques to increase stamina and strength and decrease shortness of breath with exertion.

## 2020-01-02 ENCOUNTER — Encounter (HOSPITAL_COMMUNITY)
Admission: RE | Admit: 2020-01-02 | Discharge: 2020-01-02 | Disposition: A | Discharge status: Home / Self Care | Payer: Medicare Other | Source: Ambulatory Visit | Attending: Internal Medicine | Admitting: Internal Medicine

## 2020-01-02 ENCOUNTER — Telehealth (HOSPITAL_COMMUNITY): Payer: Self-pay | Admitting: Internal Medicine

## 2020-01-02 DIAGNOSIS — J849 Interstitial pulmonary disease, unspecified: Secondary | ICD-10-CM

## 2020-01-07 ENCOUNTER — Encounter (HOSPITAL_COMMUNITY): Payer: Medicare Other

## 2020-01-09 ENCOUNTER — Telehealth (HOSPITAL_COMMUNITY): Payer: Self-pay | Admitting: *Deleted

## 2020-01-09 ENCOUNTER — Encounter (HOSPITAL_COMMUNITY): Payer: Medicare Other

## 2020-01-14 ENCOUNTER — Telehealth: Payer: Self-pay | Admitting: Internal Medicine

## 2020-01-14 ENCOUNTER — Encounter (HOSPITAL_COMMUNITY): Payer: Medicare Other

## 2020-01-14 MED ORDER — IPRATROPIUM BROMIDE 0.03 % NA SOLN: NASAL | 3 refills | Status: DC

## 2020-01-14 NOTE — Telephone Encounter (Signed)
Patient requesting refill for Atrovent nasal spray. Sent to preferred pharmacy.

## 2020-01-16 ENCOUNTER — Encounter (HOSPITAL_COMMUNITY): Payer: Medicare Other

## 2020-01-16 NOTE — Progress Notes (Signed)
Discharge Progress Report  Patient Details  Name: [name] MRN: [id number] Date of Birth: 1946/06/29 Referring Provider:     Pulmonary Rehab Walk Test from 11/11/2019 in [place]  Referring Provider [name]       Number of Visits: 11  Reason for Discharge:  Early Exit:  Discharged early due to a shoulder issue with severe pain  Smoking History:  Social History   Tobacco Use  Smoking Status Never Smoker  Smokeless Tobacco Never Used  Tobacco Comment   positive passive tobacco smoke exposure    Diagnosis:  Interstitial lung disease ([place])  ADL [facility]:  Pulmonary Assessment Scores    Row Name 11/11/19 1102         ADL [facility]   ADL Phase Entry     SOB Score total 55       CAT Score   CAT Score 17            Initial Exercise Prescription:  Initial Exercise Prescription - 11/11/19 1200      Date of Initial Exercise RX and Referring Provider   Date 11/11/19    Referring Provider [name]      Oxygen   Oxygen Continuous    Liters 4      NuStep   Level 2    SPM 80    Minutes 30      Prescription Details   Frequency (times per week) 2    Duration Progress to 30 minutes of continuous aerobic without signs/symptoms of physical distress      Intensity   THRR 40-80% of Max Heartrate 62-124    Ratings of Perceived Exertion 11-13    Perceived Dyspnea 0-4      Progression   Progression Continue to progress workloads to maintain intensity without signs/symptoms of physical distress.      Resistance Training   Training Prescription Yes    Weight orange bands    Reps 10-15           Discharge Exercise Prescription (Final Exercise Prescription Changes):  Exercise Prescription Changes - 12/31/19 1100      Response to Exercise   Blood Pressure (Admit) 114/62    Blood Pressure (Exercise) 124/68    Blood Pressure (Exit) 126/76    Heart Rate (Admit) 90 bpm    Heart Rate (Exercise) 91 bpm    Heart Rate  (Exit) 81 bpm    Oxygen Saturation (Admit) 96 %    Oxygen Saturation (Exercise) 96 %    Oxygen Saturation (Exit) 96 %    Rating of Perceived Exertion (Exercise) 13    Perceived Dyspnea (Exercise) 0    Duration Continue with 30 min of aerobic exercise without signs/symptoms of physical distress.    Intensity THRR unchanged      Progression   Progression Continue to progress workloads to maintain intensity without signs/symptoms of physical distress.      Resistance Training   Training Prescription Yes    Weight orange bands    Reps 10-15    Time 10 Minutes      Oxygen   Oxygen Continuous    Liters 4      NuStep   Level 4    SPM 80    Minutes 30    METs 2.2           Functional Capacity:  6 Minute Walk    Row Name 11/11/19 1209  6 Minute Walk   Distance 658 feet     Walk Time 5.5 minutes     # of Rest Breaks 1     MPH 1.24     METS 1.29     RPE 15     Perceived Dyspnea  2     VO2 Peak 4.5     Symptoms Yes (comment)     Comments used wheelchair. One seated rest break for 30 sec due to SOB     Resting HR 78 bpm     Resting BP 120/70     Resting Oxygen Saturation  97 %     Exercise Oxygen Saturation  during 6 min walk 90 %     Max Ex. HR 131 bpm     Max Ex. BP 144/88     2 Minute Post BP 124/80       Interval HR   1 Minute HR 99     2 Minute HR 112     3 Minute HR 120     4 Minute HR 128     5 Minute HR 125     6 Minute HR 131     2 Minute Post HR 111     Interval Heart Rate? Yes       Interval Oxygen   Interval Oxygen? Yes     Baseline Oxygen Saturation % 97 %     1 Minute Oxygen Saturation % 94 %     1 Minute Liters of Oxygen 4 L     2 Minute Oxygen Saturation % 93 %     2 Minute Liters of Oxygen 4 L     3 Minute Oxygen Saturation % 93 %     3 Minute Liters of Oxygen 4 L     4 Minute Oxygen Saturation % 92 %     4 Minute Liters of Oxygen 4 L     5 Minute Oxygen Saturation % 92 %     5 Minute Liters of Oxygen 4 L     6 Minute Oxygen  Saturation % 90 %     6 Minute Liters of Oxygen 4 L     2 Minute Post Oxygen Saturation % 97 %     2 Minute Post Liters of Oxygen 4 L            Psychological, QOL, Others - Outcomes: PHQ 2/9: Depression screen [facility] 2/9 11/11/2019 09/10/2018 05/09/2018  Decreased Interest 0 0 0  Down, Depressed, Hopeless 0 1 0  PHQ - 2 Score 0 1 0  Altered sleeping 0 3 -  Tired, decreased energy 3 3 -  Change in appetite 0 0 -  Feeling bad or failure about yourself  0 1 -  Trouble concentrating 0 0 -  Moving slowly or fidgety/restless 0 0 -  Suicidal thoughts 0 0 -  Difficult doing work/chores Somewhat difficult Somewhat difficult -  Some recent data might be hidden    Quality of Life:   Personal Goals: Goals established at orientation with interventions provided to work toward goal.  Personal Goals and Risk Factors at Admission - 11/11/19 1107      Core Components/Risk Factors/Patient Goals on Admission    Weight Management Obesity    Improve shortness of breath with ADL's Yes    Intervention Provide education, individualized exercise plan and daily activity instruction to help decrease symptoms of SOB with activities of daily living.    Expected Outcomes Short  Term: Improve cardiorespiratory fitness to achieve a reduction of symptoms when performing ADLs;Long Term: Be able to perform more ADLs without symptoms or delay the onset of symptoms    Stress Yes    Intervention Offer individual and/or small group education and counseling on adjustment to heart disease, stress management and health-related lifestyle change. Teach and support self-help strategies.;Refer participants experiencing significant psychosocial distress to appropriate mental health specialists for further evaluation and treatment. When possible, include family members and significant others in education/counseling sessions.    Expected Outcomes Short Term: Participant demonstrates changes in health-related behavior, relaxation  and other stress management skills, ability to obtain effective social support, and compliance with psychotropic medications if prescribed.;Long Term: Emotional wellbeing is indicated by absence of clinically significant psychosocial distress or social isolation.            Personal Goals Discharge:  Goals and Risk Factor Review    Row Name 11/11/19 1109 12/03/19 0831 12/30/19 4401         Core Components/Risk Factors/Patient Goals Review   Personal Goals Review Develop more efficient breathing techniques such as purse lipped breathing and diaphragmatic breathing and practicing self-pacing with activity.;Increase knowledge of respiratory medications and ability to use respiratory devices properly.;Improve shortness of breath with ADL's;Stress;Weight Management/Obesity Improve shortness of breath with ADL's;Develop more efficient breathing techniques such as purse lipped breathing and diaphragmatic breathing and practicing self-pacing with activity.;Increase knowledge of respiratory medications and ability to use respiratory devices properly.;Stress Increase knowledge of respiratory medications and ability to use respiratory devices properly.;Improve shortness of breath with ADL's;Develop more efficient breathing techniques such as purse lipped breathing and diaphragmatic breathing and practicing self-pacing with activity.     Review -- Has exercised in 4 sessions, is doing well, is on level 3 of the nustep for 30 minutes with a met level of 2.0.  No problems identified at this point. Ercie has done suprizingly well despite her rheumatoid arthritis and interstitial lung disease, she is exercising on level 4 on the nustep for 30 minutes at 2.7 mets.  Very proud of her!!!     Expected Outcomes -- See admission goals. See admission goals.            Exercise Goals and Review:  Exercise Goals    Row Name 11/11/19 1213 12/03/19 0716 12/31/19 1503         Exercise Goals   Increase Physical  Activity Yes Yes Yes     Intervention Provide advice, education, support and counseling about physical activity/exercise needs.;Develop an individualized exercise prescription for aerobic and resistive training based on initial evaluation findings, risk stratification, comorbidities and participant's personal goals. Provide advice, education, support and counseling about physical activity/exercise needs.;Develop an individualized exercise prescription for aerobic and resistive training based on initial evaluation findings, risk stratification, comorbidities and participant's personal goals. Provide advice, education, support and counseling about physical activity/exercise needs.;Develop an individualized exercise prescription for aerobic and resistive training based on initial evaluation findings, risk stratification, comorbidities and participant's personal goals.     Expected Outcomes Short Term: Attend rehab on a regular basis to increase amount of physical activity.;Long Term: Add in home exercise to make exercise part of routine and to increase amount of physical activity.;Long Term: Exercising regularly at least 3-5 days a week. Short Term: Attend rehab on a regular basis to increase amount of physical activity.;Long Term: Add in home exercise to make exercise part of routine and to increase amount of physical activity.;Long Term: Exercising regularly at least  3-5 days a week. Short Term: Attend rehab on a regular basis to increase amount of physical activity.;Long Term: Add in home exercise to make exercise part of routine and to increase amount of physical activity.;Long Term: Exercising regularly at least 3-5 days a week.     Increase Strength and Stamina Yes Yes Yes     Intervention Provide advice, education, support and counseling about physical activity/exercise needs.;Develop an individualized exercise prescription for aerobic and resistive training based on initial evaluation findings, risk  stratification, comorbidities and participant's personal goals. Provide advice, education, support and counseling about physical activity/exercise needs.;Develop an individualized exercise prescription for aerobic and resistive training based on initial evaluation findings, risk stratification, comorbidities and participant's personal goals. Provide advice, education, support and counseling about physical activity/exercise needs.;Develop an individualized exercise prescription for aerobic and resistive training based on initial evaluation findings, risk stratification, comorbidities and participant's personal goals.     Expected Outcomes Short Term: Increase workloads from initial exercise prescription for resistance, speed, and METs.;Long Term: Improve cardiorespiratory fitness, muscular endurance and strength as measured by increased METs and functional capacity (6MWT);Short Term: Perform resistance training exercises routinely during rehab and add in resistance training at home Short Term: Increase workloads from initial exercise prescription for resistance, speed, and METs.;Long Term: Improve cardiorespiratory fitness, muscular endurance and strength as measured by increased METs and functional capacity (6MWT);Short Term: Perform resistance training exercises routinely during rehab and add in resistance training at home Short Term: Increase workloads from initial exercise prescription for resistance, speed, and METs.;Long Term: Improve cardiorespiratory fitness, muscular endurance and strength as measured by increased METs and functional capacity (6MWT);Short Term: Perform resistance training exercises routinely during rehab and add in resistance training at home     Able to understand and use rate of perceived exertion (RPE) scale Yes Yes Yes     Intervention Provide education and explanation on how to use RPE scale Provide education and explanation on how to use RPE scale Provide education and explanation  on how to use RPE scale     Expected Outcomes Short Term: Able to use RPE daily in rehab to express subjective intensity level;Long Term:  Able to use RPE to guide intensity level when exercising independently Short Term: Able to use RPE daily in rehab to express subjective intensity level;Long Term:  Able to use RPE to guide intensity level when exercising independently Short Term: Able to use RPE daily in rehab to express subjective intensity level;Long Term:  Able to use RPE to guide intensity level when exercising independently     Able to understand and use Dyspnea scale Yes Yes Yes     Intervention Provide education and explanation on how to use Dyspnea scale Provide education and explanation on how to use Dyspnea scale Provide education and explanation on how to use Dyspnea scale     Expected Outcomes Short Term: Able to use Dyspnea scale daily in rehab to express subjective sense of shortness of breath during exertion;Long Term: Able to use Dyspnea scale to guide intensity level when exercising independently Short Term: Able to use Dyspnea scale daily in rehab to express subjective sense of shortness of breath during exertion;Long Term: Able to use Dyspnea scale to guide intensity level when exercising independently Short Term: Able to use Dyspnea scale daily in rehab to express subjective sense of shortness of breath during exertion;Long Term: Able to use Dyspnea scale to guide intensity level when exercising independently     Knowledge and understanding of Target  Heart Rate Range (THRR) Yes Yes Yes     Intervention Provide education and explanation of THRR including how the numbers were predicted and where they are located for reference Provide education and explanation of THRR including how the numbers were predicted and where they are located for reference Provide education and explanation of THRR including how the numbers were predicted and where they are located for reference     Expected  Outcomes Short Term: Able to state/look up THRR;Short Term: Able to use daily as guideline for intensity in rehab;Long Term: Able to use THRR to govern intensity when exercising independently Short Term: Able to state/look up THRR;Short Term: Able to use daily as guideline for intensity in rehab;Long Term: Able to use THRR to govern intensity when exercising independently Short Term: Able to state/look up THRR;Short Term: Able to use daily as guideline for intensity in rehab;Long Term: Able to use THRR to govern intensity when exercising independently     Understanding of Exercise Prescription Yes Yes Yes     Intervention Provide education, explanation, and written materials on patient's individual exercise prescription Provide education, explanation, and written materials on patient's individual exercise prescription Provide education, explanation, and written materials on patient's individual exercise prescription     Expected Outcomes Short Term: Able to explain program exercise prescription;Long Term: Able to explain home exercise prescription to exercise independently Short Term: Able to explain program exercise prescription;Long Term: Able to explain home exercise prescription to exercise independently Short Term: Able to explain program exercise prescription;Long Term: Able to explain home exercise prescription to exercise independently            Exercise Goals Re-Evaluation:  Exercise Goals Re-Evaluation    Row Name 12/03/19 0717 12/31/19 1503           Exercise Goal Re-Evaluation   Exercise Goals Review Increase Physical Activity;Increase Strength and Stamina;Able to understand and use rate of perceived exertion (RPE) scale;Able to understand and use Dyspnea scale;Knowledge and understanding of Target Heart Rate Range (THRR);Understanding of Exercise Prescription Increase Physical Activity;Increase Strength and Stamina;Able to understand and use rate of perceived exertion (RPE) scale;Able  to understand and use Dyspnea scale;Knowledge and understanding of Target Heart Rate Range (THRR);Understanding of Exercise Prescription      Comments Pt has completed 4 exercise sessions. Pt is limited by severe deconditioning and several orthopedic issues. I expect progress will be slow, but pt is motivated. She currently exercises at 2.0 METs on the stepper. Pt has completed 11 exercise sessions. Pt has progressed much better than I expected. She has purchased a stepper so she can exercise at home when she graduates. She currently exercises at 2.2 METs on the stepper. Will continue to monitor and progress as able.      Expected Outcomes Through exercise at rehab and at home, the patient will decrease shortness of breath with daily activities and feel confident in carrying out an exercise regime at home.  Through exercise at rehab and at home, the patient will decrease shortness of breath with daily activities and feel confident in carrying out an exercise regime at home.              Nutrition & Weight - Outcomes:  Pre Biometrics - 11/11/19 1053      Pre Biometrics   Grip Strength 15 kg            Nutrition:  Nutrition Therapy & Goals - 11/21/19 1203      Nutrition Therapy  Diet Generally Healthful      Personal Nutrition Goals   Nutrition Goal Pt to build a healthy plate including vegetables, fruits, whole grains, and low-fat dairy products in a heart healthy meal plan.    Personal Goal #2 Pt to identify food quantities necessary to achieve weight loss of 6-24 lb at graduation from pulmonary rehab.      Intervention Plan   Intervention Nutrition handout(s) given to patient.;Prescribe, educate and counsel regarding individualized specific dietary modifications aiming towards targeted core components such as weight, hypertension, lipid management, diabetes, heart failure and other comorbidities.    Expected Outcomes Short Term Goal: A plan has been developed with personal nutrition  goals set during dietitian appointment.           Nutrition Discharge:   Education Questionnaire Score:  Knowledge Questionnaire Score - 11/11/19 1206      Knowledge Questionnaire Score   Pre Score 14/18           Goals reviewed with patient; copy given to patient.

## 2020-01-18 ENCOUNTER — Other Ambulatory Visit: Payer: Self-pay | Admitting: Internal Medicine

## 2020-01-31 ENCOUNTER — Other Ambulatory Visit: Payer: Self-pay | Admitting: Internal Medicine

## 2020-01-31 DIAGNOSIS — I2510 Atherosclerotic heart disease of native coronary artery without angina pectoris: Secondary | ICD-10-CM

## 2020-02-05 ENCOUNTER — Other Ambulatory Visit: Payer: Self-pay

## 2020-02-05 ENCOUNTER — Encounter: Payer: Self-pay | Admitting: Internal Medicine

## 2020-02-05 ENCOUNTER — Telehealth (INDEPENDENT_AMBULATORY_CARE_PROVIDER_SITE_OTHER): Payer: Medicare Other | Admitting: Internal Medicine

## 2020-02-05 VITALS — BP 120/72 | HR 64 | Ht 61.0 in | Wt 218.0 lb

## 2020-02-05 DIAGNOSIS — I2510 Atherosclerotic heart disease of native coronary artery without angina pectoris: Secondary | ICD-10-CM

## 2020-02-05 DIAGNOSIS — I5032 Chronic diastolic (congestive) heart failure: Secondary | ICD-10-CM

## 2020-02-05 DIAGNOSIS — J9611 Chronic respiratory failure with hypoxia: Secondary | ICD-10-CM

## 2020-02-05 MED ORDER — POTASSIUM CHLORIDE CRYS ER 20 MEQ PO TBCR: 20.0000 meq | EXTENDED_RELEASE_TABLET | Freq: Two times a day (BID) | ORAL | 3 refills | Status: DC

## 2020-02-05 MED ORDER — VERAPAMIL HCL ER 240 MG PO TBCR: 240.0000 mg | EXTENDED_RELEASE_TABLET | Freq: Every day | ORAL | 3 refills | Status: DC

## 2020-02-05 MED ORDER — FUROSEMIDE 40 MG PO TABS: 40.0000 mg | ORAL_TABLET | Freq: Two times a day (BID) | ORAL | 3 refills | Status: DC

## 2020-02-05 MED ORDER — ROSUVASTATIN CALCIUM 20 MG PO TABS: 20.0000 mg | ORAL_TABLET | Freq: Every day | ORAL | 3 refills | Status: DC

## 2020-02-05 NOTE — Patient Instructions (Signed)
Medication Instructions:  Your physician recommends that you continue on your current medications as directed. Please refer to the Current Medication list given to you today.  Your cardiac medications have been refilled and sent to [facility].  *If you need a refill on your cardiac medications before your next appointment, please call your pharmacy*   Lab Work: None ordered If you have labs (blood work) drawn today and your tests are completely normal, you will receive your results only by: Marland Kitchen MyChart Message (if you have MyChart) OR . A paper copy in the mail If you have any lab test that is abnormal or we need to change your treatment, we will call you to review the results.   Testing/Procedures: None ordered   Follow-Up: At [facility], you and your health needs are our priority.  As part of our continuing mission to provide you with exceptional heart care, we have created designated Provider Care Teams.  These Care Teams include your primary Cardiologist (physician) and Advanced Practice Providers (APPs -  Physician Assistants and Nurse Practitioners) who all work together to provide you with the care you need, when you need it.  We recommend signing up for the patient portal called "MyChart".  Sign up information is provided on this After Visit Summary.  MyChart is used to connect with patients for Virtual Visits (Telemedicine).  Patients are able to view lab/test results, encounter notes, upcoming appointments, etc.  Non-urgent messages can be sent to your provider as well.   To learn more about what you can do with MyChart, go to [email or web address].    Your next appointment:   6 month(s)  The format for your next appointment:   In Person  Provider:    You may see Nelva Bush, MD or one of the following Advanced Practice Providers on your designated Care Team:    Murray Hodgkins, NP  Christell Faith, PA-C  Marrianne Mood, PA-C    Other Instructions N/A

## 2020-02-05 NOTE — Progress Notes (Signed)
Virtual Visit via Video Note   This visit type was conducted due to national recommendations for restrictions regarding the COVID-19 Pandemic (e.g. social distancing) in an effort to limit this patient's exposure and mitigate transmission in our community.  Due to her co-morbid illnesses, this patient is at least at moderate risk for complications without adequate follow up.  This format is felt to be most appropriate for this patient at this time.  All issues noted in this document were discussed and addressed.  A limited physical exam was performed with this format.  Please refer to the patient's chart for her consent to telehealth for [facility].      The patient was identified using 2 identifiers.  Date:  02/05/2020   ID:  [name], DOB 07-Sep-1945, MRN [id number]  Patient Location: Home Provider Location: Office/Clinic  PCP:  Lorene Dy, MD  Cardiologist:  Nelva Bush, MD  Electrophysiologist:  None   Evaluation Performed:  Follow-Up Visit  Chief Complaint: Shortness of breath  History of Present Illness:    STEHANIE [name] is a 74 y.o. female with history of nonobstructive coronary artery disease, asthmatic bronchitis, interstitial lung disease, GERD, rheumatoid arthritis, CVA, prediabetes, IBS, and allergic rhinitis.  I last saw her in January, at which time she reported stable exertional dyspnea.  She was subsequently seen by Dr. Haroldine Laws in April, who felt that her dyspnea was unlikely related to significant pulmonary hypertension or heart failure.  Repeat echo in the late April showed normal LVEF without evidence of diastolic dysfunction or significant pulmonary hypertension.  Today, Ms. Atayde reports feeling relatively well.  She recently experienced severe left shoulder pain but notes that it has improved with systemic steroids as well as a steroid injection by orthopedics.  She was told that surgery may ultimately be needed, though she would like to avoid  this if possible, given difficulty with healing/recovery with prior orthopedic procedures.  Ms. Creer reports stable exertional dyspnea.  She has completed cardiopulmonary rehab and is trying to exercise with a NuStep machine at home.  She feels like her stamina is improving.  She denies chest pain, palpitations, lightheadedness, and edema.  She continues to wear 4 L of supplemental oxygen around-the-clock, with most oxygen saturations in the mid to upper 90s, though occasionally it will drop into the upper 80s.   Past Medical History:  Diagnosis Date  . Acute asthmatic bronchitis   . Allergic rhinitis   . Anxiety   . Arthritis   . Esophageal reflux   . Hypertension   . Interstitial lung disease ([place]) dx jan 2020  . Irritable bowel syndrome   . Oxygen dependent    4 liters day time 6 liters at night  . PONV (postoperative nausea and vomiting)    ponv likes zofran, and scopolamine patch  . Rheumatoid arthritis(714.0)   . Sleep apnea    Past Surgical History:  Procedure Laterality Date  .  c secttion  1977  . 2 foot fusions Left    total of 6 left foot sx  . ABDOMINAL HYSTERECTOMY     complete   . ANKLE FUSION  2009   left  . BACK SURGERY     lower l to l 5 fused  . CHOLECYSTECTOMY    . RIGHT/LEFT HEART CATH AND CORONARY ANGIOGRAPHY N/A 09/16/2016   Procedure: Right/Left Heart Cath and Coronary Angiography;  Surgeon: Nelva Bush, MD;  Location: [place] [facility];  Service: Cardiovascular;  Laterality: N/A;  .  TONSILLECTOMY    . TOTAL HIP ARTHROPLASTY Right 12/20/2018   Procedure: TOTAL HIP ARTHROPLASTY ANTERIOR APPROACH;  Surgeon: Paralee Cancel, MD;  Location: [facility];  Service: Orthopedics;  Laterality: Right;  70 mins  . TOTAL KNEE ARTHROPLASTY Bilateral      Current Meds  Medication Sig  . albuterol (VENTOLIN HFA) 108 (90 Base) MCG/ACT inhaler USE 2 PUFFS BY MOUTH EVERY  6 HOURS AS NEEDED  . aspirin EC 81 MG tablet Take 81 mg by mouth daily.  . benzonatate (TESSALON) 200  MG capsule TAKE 1 CAPSULE BY MOUTH 3 TIMES A DAY AS NEEDED FOR COUGH (Patient taking differently: Take 200 mg by mouth 3 (three) times daily as needed for cough. TAKE 1 CAPSULE BY MOUTH 3 TIMES A DAY AS NEEDED FOR COUGH)  . cetirizine (ZYRTEC) 10 MG tablet Take 10 mg by mouth daily.  . clidinium-chlordiazePOXIDE (LIBRAX) 5-2.5 MG capsule Take 1 capsule by mouth 2 (two) times daily as needed (for IBS).  . colestipol (COLESTID) 1 G tablet Take 1 g by mouth 2 (two) times daily.   Marland Kitchen dicyclomine (BENTYL) 20 MG tablet Take 20 mg by mouth 4 (four) times daily -  before meals and at bedtime.  Marland Kitchen doxycycline (VIBRAMYCIN) 100 MG capsule Take 100 mg by mouth 2 (two) times daily.  Marland Kitchen FLUoxetine (PROZAC) 20 MG capsule Take 20 mg by mouth daily.  . fluticasone (FLONASE) 50 MCG/ACT nasal spray Place 2 sprays into both nostrils daily.  . Fluticasone-Umeclidin-Vilant (TRELEGY ELLIPTA) 100-62.5-25 MCG/INH AEPB Inhale 1 puff into the lungs daily.  . furosemide (LASIX) 40 MG tablet Take 1 tablet (40 mg total) by mouth 2 (two) times daily.  . hydroxychloroquine (PLAQUENIL) 200 MG tablet Take 2 tablets (400 mg total) by mouth daily. Stop for 2 weeks, post-op.  Marland Kitchen ipratropium (ATROVENT) 0.03 % nasal spray USE 1 TO 2 SPRAYS IN BOTH  NOSTRILS TWICE DAILY  . ipratropium-albuterol (DUONEB) 0.5-2.5 (3) MG/3ML SOLN USE 1 VIAL IN NEBULIZER EVERY 6 HOURS AS NEEDED *J45.901* npi [id number]  . leflunomide (ARAVA) 20 MG tablet Take 20 mg by mouth daily.   . methocarbamol (ROBAXIN) 500 MG tablet Take 1 tablet (500 mg total) by mouth every 6 (six) hours as needed for muscle spasms.  . mirtazapine (REMERON) 45 MG tablet Take 45 mg by mouth at bedtime.  . montelukast (SINGULAIR) 10 MG tablet TAKE 1 TABLET BY MOUTH AT  BEDTIME  . Multiple Vitamin (MULTIVITAMIN) tablet Take 1 tablet by mouth daily.  Marland Kitchen omeprazole (PRILOSEC) 40 MG capsule Take 40 mg by mouth at bedtime.   . OXYGEN Inhale 4 mLs into the lungs continuous.   . Pirfenidone  (ESBRIET) 801 MG TABS Take 801 mg by mouth 3 (three) times daily.  . potassium chloride SA (KLOR-CON) 20 MEQ tablet Take 1 tablet (20 mEq total) by mouth 2 (two) times daily.  . predniSONE (DELTASONE) 5 MG tablet Take 5 mg by mouth daily with breakfast.  . rosuvastatin (CRESTOR) 20 MG tablet Take 1 tablet (20 mg total) by mouth daily.  . verapamil (CALAN-SR) 240 MG CR tablet Take 1 tablet (240 mg total) by mouth daily.  . [DISCONTINUED] furosemide (LASIX) 40 MG tablet TAKE 1 TABLET BY MOUTH  TWICE DAILY  . [DISCONTINUED] potassium chloride SA (KLOR-CON) 20 MEQ tablet TAKE 1 TABLET BY MOUTH  TWICE DAILY  . [DISCONTINUED] rosuvastatin (CRESTOR) 20 MG tablet TAKE 1 TABLET BY MOUTH  DAILY  . [DISCONTINUED] verapamil (CALAN-SR) 240 MG CR tablet TAKE 1 TABLET  BY MOUTH  DAILY     Allergies:   Cefdinir; Erythromycin base; Lactose; Lactose intolerance (gi); Lactulose; Mesalamine; Nitrofuran derivatives; Nitrofurantoin; Other; Sulfa antibiotics; Tdap [tetanus-diphth-acell pertussis]; and Tetanus toxoid, adsorbed   Social History   Tobacco Use  . Smoking status: Never Smoker  . Smokeless tobacco: Never Used  . Tobacco comment: positive passive tobacco smoke exposure  Vaping Use  . Vaping Use: Never used  Substance Use Topics  . Alcohol use: Yes    Comment: glass of wine each night   . Drug use: No     Family Hx: The patient's family history includes Arthritis in her mother; Diabetes in an other family member; Heart attack in her father and another family member; Heart disease in her mother.  ROS:   Please see the history of present illness.   All other systems reviewed and are negative.   Prior CV studies:   The following studies were reviewed today:  Echocardiogram (11/15/2019): 1. Technically difficult study. Left ventricular ejection fraction, by  estimation, is 55 to 60%. The left ventricle has normal function. The left  ventricle has no regional wall motion abnormalities. There is  mild left  ventricular hypertrophy. Left  ventricular diastolic parameters were normal.  2. Right ventricular systolic function is normal. The right ventricular  size is normal. Tricuspid regurgitation signal is inadequate for assessing  PA pressure.  3. The mitral valve is normal in structure. No evidence of mitral valve  regurgitation.  4. The aortic valve was not well visualized. Aortic valve regurgitation  is not visualized. No aortic stenosis is present.  5. The inferior vena cava is normal in size with greater than 50%  respiratory variability, suggesting right atrial pressure of 3 mmHg.   Labs/Other Tests and Data Reviewed:    EKG:  No ECG reviewed.  Recent Labs: 02/13/2019: BUN 15; Creatinine, Ser 0.73; Potassium 4.2; Sodium 142 10/02/2019: ALT 24   Recent Lipid Panel Lab Results  Component Value Date/Time   CHOL 127 01/09/2017 10:24 AM   TRIG 181 (H) 01/09/2017 10:24 AM   HDL 53 01/09/2017 10:24 AM   CHOLHDL 2.4 01/09/2017 10:24 AM   LDLCALC 38 01/09/2017 10:24 AM   LDLDIRECT 53 01/09/2017 10:24 AM    Wt Readings from Last 3 Encounters:  02/05/20 218 lb (98.9 kg)  12/31/19 204 lb 12.9 oz (92.9 kg)  12/17/19 205 lb 0.4 oz (93 kg)     Objective:    Vital Signs:  BP 120/72 (BP Location: Left Arm, Patient Position: Sitting, Cuff Size: Normal)   Pulse 64   Ht _0  (1.549 m)   Wt 218 lb (98.9 kg)   BMI 41.19 kg/m    VITAL SIGNS:  reviewed GEN:  no acute distress  ASSESSMENT & PLAN:    Chronic respiratory failure with hypoxia: Largely driven by underlying interstitial lung disease.  Thus far, no evidence of significant cardiac pathology to explain her chronic dyspnea and hypoxia based on my prior work-up as well as recent consultation with Dr. Haroldine Laws in the heart failure clinic.  Chronic HFpEF: Ms. Simone appears euvolemic on exam today.  We will continue her current regimen of furosemide 40 mg twice daily as well as potassium  supplementation.  Nonobstructive coronary artery disease: No signs or symptoms to suggest worsening coronary insufficiency.  Continue aspirin and statin therapy to prevent progression of disease.  Time:   Today, I have spent 15 minutes with the patient with telehealth technology discussing the above problems.  Medication Adjustments/Labs and Tests Ordered: Current medicines are reviewed at length with the patient today.  Concerns regarding medicines are outlined above.   Tests Ordered: No orders of the defined types were placed in this encounter.   Medication Changes: Meds ordered this encounter  Medications  . furosemide (LASIX) 40 MG tablet    Sig: Take 1 tablet (40 mg total) by mouth 2 (two) times daily.    Dispense:  180 tablet    Refill:  3    Requesting 1 year supply  . potassium chloride SA (KLOR-CON) 20 MEQ tablet    Sig: Take 1 tablet (20 mEq total) by mouth 2 (two) times daily.    Dispense:  180 tablet    Refill:  3    Requesting 1 year supply  . rosuvastatin (CRESTOR) 20 MG tablet    Sig: Take 1 tablet (20 mg total) by mouth daily.    Dispense:  90 tablet    Refill:  3    Requesting 1 year supply  . verapamil (CALAN-SR) 240 MG CR tablet    Sig: Take 1 tablet (240 mg total) by mouth daily.    Dispense:  90 tablet    Refill:  3    Requesting 1 year supply    Follow Up:  In Person in 6 month(s)  Signed, Nelva Bush, MD  02/05/2020 7:49 PM    [place]

## 2020-03-28 ENCOUNTER — Other Ambulatory Visit (HOSPITAL_COMMUNITY): Payer: Medicare Other

## 2020-04-01 ENCOUNTER — Ambulatory Visit (INDEPENDENT_AMBULATORY_CARE_PROVIDER_SITE_OTHER): Payer: Medicare Other | Admitting: Internal Medicine

## 2020-04-01 ENCOUNTER — Other Ambulatory Visit: Payer: Self-pay

## 2020-04-01 ENCOUNTER — Other Ambulatory Visit: Payer: Self-pay | Admitting: Pulmonary Disease

## 2020-04-01 DIAGNOSIS — J849 Interstitial pulmonary disease, unspecified: Secondary | ICD-10-CM

## 2020-04-01 DIAGNOSIS — J328 Other chronic sinusitis: Secondary | ICD-10-CM

## 2020-04-01 LAB — PULMONARY FUNCTION TEST
DL/VA % pred: 101 %
DL/VA: 4.25 ml/min/mmHg/L
DLCO cor % pred: 66 %
DLCO cor: 11.37 ml/min/mmHg
DLCO unc % pred: 66 %
DLCO unc: 11.37 ml/min/mmHg
FEF 25-75 Pre: 1.55 L/sec
FEF2575-%Pred-Pre: 103 %
FEV1-%Pred-Pre: 73 %
FEV1-Pre: 1.35 L
FEV1FVC-%Pred-Pre: 112 %
FEV6-%Pred-Pre: 68 %
FEV6-Pre: 1.6 L
FEV6FVC-%Pred-Pre: 105 %
FVC-%Pred-Pre: 65 %
FVC-Pre: 1.6 L
Pre FEV1/FVC ratio: 84 %
Pre FEV6/FVC Ratio: 100 %

## 2020-04-01 NOTE — Progress Notes (Signed)
Spirometry and DLCO performed today.

## 2020-04-02 ENCOUNTER — Ambulatory Visit (INDEPENDENT_AMBULATORY_CARE_PROVIDER_SITE_OTHER): Payer: Medicare Other | Admitting: Internal Medicine

## 2020-04-02 ENCOUNTER — Other Ambulatory Visit: Payer: Self-pay

## 2020-04-02 ENCOUNTER — Encounter: Payer: Self-pay | Admitting: Internal Medicine

## 2020-04-02 VITALS — BP 120/82 | HR 99 | Temp 98.2°F | Ht 61.0 in | Wt 207.6 lb

## 2020-04-02 DIAGNOSIS — J849 Interstitial pulmonary disease, unspecified: Secondary | ICD-10-CM

## 2020-04-02 DIAGNOSIS — J9611 Chronic respiratory failure with hypoxia: Secondary | ICD-10-CM

## 2020-04-02 DIAGNOSIS — I2510 Atherosclerotic heart disease of native coronary artery without angina pectoris: Secondary | ICD-10-CM | POA: Diagnosis not present

## 2020-04-02 DIAGNOSIS — Z7185 Encounter for immunization safety counseling: Secondary | ICD-10-CM

## 2020-04-02 DIAGNOSIS — J4540 Moderate persistent asthma, uncomplicated: Secondary | ICD-10-CM

## 2020-04-02 DIAGNOSIS — M359 Systemic involvement of connective tissue, unspecified: Secondary | ICD-10-CM

## 2020-04-02 DIAGNOSIS — Z5181 Encounter for therapeutic drug level monitoring: Secondary | ICD-10-CM | POA: Diagnosis not present

## 2020-04-02 DIAGNOSIS — Z79899 Other long term (current) drug therapy: Secondary | ICD-10-CM

## 2020-04-02 DIAGNOSIS — J8489 Other specified interstitial pulmonary diseases: Secondary | ICD-10-CM | POA: Diagnosis not present

## 2020-04-02 DIAGNOSIS — Z23 Encounter for immunization: Secondary | ICD-10-CM

## 2020-04-02 DIAGNOSIS — Z7189 Other specified counseling: Secondary | ICD-10-CM

## 2020-04-02 DIAGNOSIS — G4733 Obstructive sleep apnea (adult) (pediatric): Secondary | ICD-10-CM

## 2020-04-02 MED ORDER — ESBRIET 801 MG PO TABS: 801.0000 mg | ORAL_TABLET | Freq: Three times a day (TID) | ORAL | 11 refills | Status: DC

## 2020-04-02 NOTE — Progress Notes (Signed)
05/21/2018- 74 year old female never smoker followed for allergic rhinitis, rhinosinusitis, asthmatic bronchitis, ILD, OSA,complicated by GERD/Crohn's, Rheumatoid arthritis/prednisone -----4 month follow up for asthma and OSA. Per patient she is still having issues with SOB. She has had 2 foot surgeries since her last visit. States that the [name] is working but [name] wanted to switch her to something else. Unsure of the medication name and I did not see it in his last OV.  Singulair, and Anoro, neb duoneb CPAP auto 5-15/Advanced   Download 100% compliance AHI 1.3/hour. I discussed FENO and she will try Symbicort. Discussed inhaled steroids.  We need to get her a new AeroChamber. Has been on sustained antibiotics for failed appliance in her foot. Notices some runny nose and scant bland mucus.  Wheezing has been well controlled.  Little cough.  She and her husband recognize inability to get any exercise as a substantial reason for dyspnea.  No acute events.  Cardiology continues to follow, including her pulmonary hypertension.  We reviewed her most recent chest CT and discussed implications of progressive interstitial fibrosis.  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  I would like to refer her to [name] for his opinion and advice. FENO 03/15/18  49 H CT chest Hi Res 02/02/2018- IMPRESSION: 1. Spectrum of findings compatible with fibrotic interstitial lung disease with a slight basilar predominance, with mild progression since 2017. 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 2. Mild patchy air trapping, unchanged, indicative of small airways disease. 3. Left main and two-vessel coronary atherosclerosis. 4. Stable dilated main pulmonary artery, suggesting chronic pulmonary arterial hypertension. Aortic Atherosclerosis (ICD10-I70.0).  06/08/18-  74 year old female never smoker followed for allergic rhinitis, rhinosinusitis, asthmatic bronchitis, ILD, OSA,complicated by GERD/Crohn's, Rheumatoid arthritis/prednisone -----coughing for 2 weeks, yellow, green mucus, SOB  Singulair, Atrovent nasal spray, Symbicort 160, and Anoro, neb albuterol, neb DuoNeb, Complains of increased cough with chest congestion, green/yellow sputum, low-grade fever 99 degrees over the last 2 weeks.  Not on prednisone now.  Last antibiotic in October. Pending ILD consultation with [name] in December.   07/02/2018  - Visit   74 year old female patient presenting today for acute visit.  Patient was recently treated with a course of Augmentin and reports sputum color is now light green but has decreased in sputum amount since last office visit.  Patient does not feel she is getting better.  Patient remains adherent to Anoro Ellipta.  On arrival to our office today patient was short of breath and oxygen saturations were 81%.  With deep breathing patient was able to increase oxygenation to 85%.  When placed on 2 L O2 patient was able to maintain at 91%.  Patient had previously been on oxygen in 2015 but this was later [facility].  Patient's weight has also increased over the previous office visits, patient remains adherent to diuretic treatment plan as on taking 1 diuretic daily.  Patient reports she is allowed to take an additional diuretic when she feels necessary.  Unfortunately patient has not been weighing herself regularly there is no set plan to when she should take her second diuretic.  Patient is exceptionally sedentary at home and rarely gets out of her bed or lazy boy chair which is about 3 feet from her bed.  Patient's husband takes care of patient completely including getting drinks and food so patient does not have to leave her lazy boy  recliner.  Patient watches many television shows and does not report much activity.   Patient has completed follow-up with  rheumatology last week and no changes to her medications were made.  Patient reports that she was stable at the time.  CPAP compliance report showing 30 out of 30 days used.  All 30 those days greater than 4 hours.  APAP settings 5-15.  Average usage days 7 hours and 56 minutes.  AHI 0.8.    OV 07/31/2018 -referred to interstitial lung disease clinic  Subjective:  [name], female , DOB: 1946/04/21 , age 53 y.o. , MRN: [id number] , ADDRESS: [address] [place] [address]   07/31/2018 -   Chief Complaint  Patient presents with  . Follow-up    [name] that stated to be referred to ILD clinic.  Pt currently has complaints of wheezing which she has had since December 2019, worsening SOB when moving around and also states she has a pain in left side of chest near shoulder. Pt wears between 2-4L O2 but mainly has been wearing 4L at home.      HPI HAYLE PARISI 74 y.o. -accompanied by her husband.  Referred by [name] pulmonologist in our practice for evaluation of interstitial lung disease in the setting of rheumatoid arthritis.  History is gained from her, talking to her husband, review of recent office visits and also the interstitial lung disease questionnaire.  As best as I can gather   [place] ILD Questionnaire  Symptoms: She is known to have ILD on a CT chest in 2017 but she is not aware of it.  She says that she follows normally for sleep apnea and asthma with [name].  She recollects that approximately 11 or 12 years ago she was hospitalized for pneumonia and was on oxygen for short while not otherwise specified.  Then came off oxygen.  After that overall she is been stable and sedentary using her CPAP.  She is mostly sedentary because of obesity, rheumatoid arthritis and also because of the nonhealing ulcer in her feet for which she is been advised sedentary lifestyle to enable healing.  Most recently to  Thanksgiving 2019 she was seen for respiratory exacerbation and given antibiotics and prednisone.  She followed up mid December 2019 and was found to have new onset hypoxemia 85% on room air.  She was then discharged on portable oxygen which she is using 4 L nasal cannula at rest.  She tells me that at home when she comes off of CPAP she is in her 24s on room air at rest.  In fact in the office today she is 83% on room air at rest but 93% on 2 L nasal cannula at rest.  There is concerned that she has worsening ILD particularly summer 2019 on her CT chest had progressive findings compared to 2 years earlier.  The presumption is that this ILD is caused by rheumatoid arthritis.  In the 2019 CT chest CT is reported as probable UIP but in my personal visualization is either indeterminate or alternative diagnosis.  In terms of symptoms: She reports insidious onset of shortness of breath for some years and gradually getting worse.  Level 2 dyspnea at rest.  Level for dyspnea taking a shower.  Level 5 dyspnea walking at her own pace of walking with others of her age and walking up stairs or walking up a hill.  She does have  a cough with yellow and green sputum.  She does cough at night.  There is also wheezing  Past Medical History : She has longstanding history of rheumatoid arthritis since the 1980s.  Used to be followed by Dr. Danie Binder.  As best as I can gather she used to be on methotrexate maybe 10 or 20 years ago.  She took it for a short while and then stopped it because of diarrhea.  She took this for less than 1 year.  She believes that after that she was basically on pain management.  She is only been on prednisone chronic intake for a total of 1 year approximately 4 to 5 years ago.  Around this time Dr. Hoyt Koch retired and she started seeing Dr. Gavin Pound.  Since then she is been on Arava and Plaquenil.  She has never seen any other immunomodulators in the setting of rheumatoid arthritis  according to history.  Overall the rheumatoid arthritis has caused some disability with deformed joints in her hands and also her using cane.    She is not on any anticoagulation is never had any blood clots.  In 2017 she had vascular extremity duplex venous that was negative for DVT.  Most recently she is been dealing with nonhealing ulcer of the left toe/metatarsal .  She had August and September 2018.  And then 1 in the latter part of 2019.  Most recently she says she has had a duplex lower extremity venous.  I do not have access to these results.  She is waiting on this.  Was done in the last few to several days.   She also has a longstanding history of asthma for which she is on Singulair.  Exam nitric oxide was elevated in August 2019 at 49 ppb but 07/31/2018 I snormal in the 20s though she is wheezing   She has nonobstructive coronary artery disease and in March 2018 had a right heart catheterization with elevated pulmonary pressures but also slightly elevated wedge pressure [see below].  She is started on Lasix  She has sleep apnea for which she uses CPAP.  She has obesity  ROS: Positive for arthralgia and arthritis.  Dry eyes and dry mouth but otherwise negative   FAMILY HISTORY of LUNG DISEASE: Denies   EXPOSURE HISTORY: Never smoked any cigarettes, marijuana, vaping, cocaine or intravenous drug use   HOME and HOBBY DETAILS : [place] home [place] lived there for 8 years.  No mold.  No dampness.  Does use humidifier and does use CPAP but there is no mold in it.  Also uses nebulizer machine but there is no mold.  No pet birds no musical instruments no guarding habits   OCCUPATIONAL HISTORY (122 questions) : Denies   PULMONARY TOXICITY HISTORY (27 items): She has taken nitrofurantoin 9 years ago.  Has not taken methotrexate for a year 5 years ago and is taken sulfasalazine       Results for NATALEE, TOMKIEWICZ (MRN [id number]) as of 07/31/2018 16:31  Ref. Range  09/03/2015 12:45 02/21/2017  FVC-Pre Latest Units: L 2.18 1.7L  FVC-%Pred-Pre Latest Units: % 84 66%   Results for COHEN, DOLEMAN (MRN [id number]) as of 07/31/2018 16:31  Ref. Range 09/03/2015 12:45  DLCO cor Latest Units: ml/min/mmHg 14.06  DLCO cor % pred Latest Units: % 69   HEART CATH MARCH 2018 Right Heart Pressures RA (mean): 13 mmHg RV (S/EDP): 38/12 mmHg PA (S/D, mean): 43/23 (32) mmHg PCWP (mean): 16 mmHg  Conclusions: 1. Mild to moderate, non-obstructive coronary artery disease with 50% mid LAD and 20% proximal RCA stenoses. 2. Mild to moderate pulmonary hypertension with mildly elevated right heart filling pressures. 3. Upper normal left heart filling pressures. 4. Normal Fick cardiac output/index. 5. Equalization of end-diastolic pressures with ventricular concordance. This can be seen in the setting of restrictive process.  Recommendations: 1. Escalate statin therapy to prevent progression of CAD. 2. Increase furosemide to 40 mg BID and KCl to 40 mg BID. Patient to have BMP in 1 week to evaluate renal function and potassium. 3. Continue outpatient pulmonary follow-up. I suspect underlying lung disease is the driving force behind the patient's dyspnea, pulmonary hypertension, and elevated right heart pressures. 4. Outpatient follow-up in cardiology clinic in ~6 weeks.  Nelva Bush, MD   CT chest high res July 2019 - visualized personally - personaly opinion - indeterminate or alt dx for UIP but agree with progression  IMPRESSION: Lungs/Pleura: No pneumothorax. No pleural effusion. No acute consolidative airspace disease or lung masses. Few scattered small solid pulmonary nodules in mid to upper right lung, largest 4 mm in the right upper lobe (series 3/image 46), unchanged since 03/14/2016 CT, considered benign. No new significant pulmonary nodules. Mild patchy air trapping in both lungs on the expiration sequence, unchanged. Patchy peribronchovascular and  subpleural reticulation and ground-glass attenuation throughout both lungs with associated mild traction bronchiectasis and mild architectural distortion. There is a slight basilar gradient to these findings. Tiny focus of honeycombing in anterior right upper lobe (series 8/image 59). These findings have mildly worsened since 03/14/2016 chest CT. 1. Spectrum of findings compatible with fibrotic interstitial lung disease with a slight basilar predominance, with mild progression since 2017. 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 2. Mild patchy air trapping, unchanged, indicative of small airways disease. 3. Left main and two-vessel coronary atherosclerosis. 4. Stable dilated main pulmonary artery, suggesting chronic pulmonary arterial hypertension.  Aortic Atherosclerosis (ICD10-I70.0).   Electronically Signed   By: Ilona Sorrel M.D.   On: 02/02/2018 14:03        OV 03/15/2019  Subjective:  [name], female , DOB: November 12, 1945 , age 29 y.o. , MRN: [id number] , ADDRESS: [address] [place] [address]   03/15/2019 -   Chief Complaint  Patient presents with  . IPD (Interstitial pulmonary disease)    Breathing is doing better since being on Esbriet. Has productive cough with green phelgm. Has been on antibiotic for last 6 weeks. Had hip replacement in June.     HPI TUYEN UNCAPHER 74 y.o. 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  Started on Esbriet in March 2020.  Has progressive phenotype.   She presents for routine follow-up with her husband.  She tells me that ever since starting Esbriet in the spring 2020 she has been doing well.  She does not feel that the disease has progressed.  In fact she feels somewhat better.  She continues to use 4 L of oxygen.  She is now taking the Esbriet at 1 pill  3 times daily.  This is the full dose of 1 pill.  Her last pulmonary function test was in early 2020.  It has been a while since she did her liver function test and she requires a repeat of that today.  She is willing to have her flu shot today.  She is  asking for prescription for emergency electric generator.  She does not know about the [employer] energy assistance program.  She has a handicap sticker.  She is interested in the ILD-PRO research registry study.    She is on Trelegy inhaler and Singulair I am not fully clear why   In June 2020 she had hip replacement for stress fracture and was on 6 weeks of Keflex.  She is slowly improving.  She uses a walker.  OV 05/17/2019  Subjective:  [name], female , DOB: [birth year] , age 84 y.o. , MRN: [id number] , AD[address]ESS: [address] [place] [address]   05/17/2019 -   Chief Complaint  Patient presents with  . Follow-up    Patient reports that she has had a productive cough with yellow sputum and her PCP placed her on an antibiotic.    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  Started on Esbriet in March 2020.  Has progressive phenotype. COVID negative 04/25/2019,. On 02 4L [place] since end 2019  Also hx of asthma  Also hx of chronic sinusitis - 2017 CT maxillary sinusits - DR Wilburn Cornelia  History of sleep apnea on CPAP  HPI EMORI MUMME 74 y.o. -presents to the ILD clinic for the above issues.  She continues on pirfenidone since March 2020.  She says this is helping her.  No adverse effects.  Last liver function test was August 2020.  She needs another liver function test right now.  No nausea vomiting or diarrhea or weight loss.  She continues on 4 L of oxygen continuously 24/7.  At night she uses CPAP for her sleep apnea.  She and her husband think that she needs 6 L of oxygen at night and they are trying to increase the oxygen.  Although this is not based on data but on  subjective reasoning.    New issue associated with sinus issues: Her main issue today is that for the last 3-week she has had worsening of her cough and there is associated yellow sputum.  Her COVID-19 test was -2 days ago.  This been going on for 3 weeks.  She tells me this happens to her every winter.  She is complaining of chronic postnasal drainage.  In 2017 she had a CT scan of the sinus that showed maxillary sinusitis and saw Dr. Wilburn Cornelia but since then has not followed up.  She does not have a history of sinus surgery.  She is frustrated by the cough and the yellow sputum.  Her pulmonary function test itself is stable since she started the [place].  She is interested in the ILD pro registry study.  In terms of her asthma: She is on Trelegy and Singulair.  She does not want to stop this.  There is a clinical history.  She says that if she stops this it gets worse.   In terms of her sleep apnea: She continues on CPAP with oxygen at night.    ROS    SYMPTOM SCALE - ILD 03/15/2019  05/17/2019   O2 use x   Shortness of Breath 0 -> 5 scale with 5 being worst (score 6 If unable to do)   At rest 0 0  Simple tasks - showers, clothes change, eating, shaving 0 0  Household (dishes, doing bed, laundry) 5 6  Shopping 5 6  Walking level at own pace 3 2  Walking keeping up with others of same age 28  3  Walking up Stairs 5 3  Walking up [place] 5 6  Total (40 - 48) Dyspnea Score 25 26  How bad is your cough? 2 3  How bad is your fatigue 5 5    Simple office walk 185 feet x  3 laps goal with forehead probe 03/15/2019 - uses 4L [place] at home   O2 used Room air  Number laps completed 1/2 lap ad desat to 88%  Comments about pace   Resting Pulse Ox/HR   Final Pulse Ox/HR   Desaturated </= 88%   Desaturated <= 3% points   Got Tachycardic >/= 90/min   Symptoms at end of test   Miscellaneous comments -walked with 4L [place] - uses 4L [place] at home     Results for MYRENE, BOUGHER (MRN [id number]) as of  05/17/2019 10:56  Ref. Range 09/03/2015 12:45 10/01/2018 14:45 05/17/2019 08:59  FVC-Pre Latest Units: L 2.18 1.81 1.82  FVC-%Pred-Pre Latest Units: % 84 72 74  Results for DEBHORA, TITUS (MRN [id number]) as of 05/17/2019 10:56  Ref. Range 09/03/2015 12:45 10/01/2018 14:45 05/17/2019 08:59  DLCO unc Latest Units: ml/min/mmHg 14.82 11.94 11.28  DLCO unc % pred Latest Units: % 73 69 65    IMPRESSION: CT chest Jan  2020 1. Severe tracheobronchomalacia, more pronounced on today's scan. 2. Interval progression of basilar predominant fibrotic interstitial lung disease with particularly increased ground-glass component. No honeycombing. 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 Findings are consistent with UIP per consensus guidelines: Diagnosis of Idiopathic Pulmonary Fibrosis: An Official ATS/ERS/JRS/ALAT Clinical Practice Guideline. [place], Iss 5, [phone], Mar 18 2017. 3. Stable dilated main pulmonary artery, suggesting chronic pulmonary arterial hypertension. 4. Left main and 3 vessel coronary atherosclerosis.  Aortic Atherosclerosis (ICD10-I70.0).   Electronically Signed   By: Ilona Sorrel M.D.   On: 08/20/2018 08:21   - per HPI  OV 04/02/2020  Subjective:  [name], female , DOB: [birth year] , age 87 y.o. 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  Started on Esbriet in March 2020.  Has progressive phenotype. COVID negative 04/25/2019,. On 02 4L [place] since end 2019  - on ild pro registry  Also hx of asthma -Singulair and inhaler  Also hx of chronic sinusitis - 2017 CT maxillary sinusits - DR Wilburn Cornelia  History of sleep apnea on CPAP  Rheumatoid arthritis seen by Dr. Gavin Pound on leflunomide, Plaquenil and daily prednisone 5  mg/day.  04/02/2020 -   Chief Complaint  Patient presents with  . Follow-up    pt is here to go over pft results     HPI TEDDIE CURD 74 y.o. -presents for the above issues of interstitial lung disease in the setting of connective tissue disease.  She is on leflunomide and Plaquenil and prednisone with her rheumatologist.  She is on pirfenidone for her ILD.  I personally last saw her almost a year ago in October 2020.  After that she is seen nurse practitioner.  In the interim she says she is overall stable with the symptom score show a different story she has had significant worsening of her symptoms.  She continues to be morbidly obese.  Walking desaturation test she is desaturating to the point she is requiring 6 L to correct.  In fact  early in August 2020 [place] and was extremely short of breath.  Even at rest on 3 L she was 89%.  She continues to use a CPAP.  Her husband who is here with her today would like her to lose weight in order to help her health status.  Pulmonary function test shows also a decline.  Her last CT scan of the chest was in June 2020   SYMPTOM SCALE - ILD 03/15/2019  05/17/2019  04/02/2020   O2 use x    Shortness of Breath 0 -> 5 scale with 5 being worst (score 6 If unable to do)    At rest 0 0 4  Simple tasks - showers, clothes change, eating, shaving 0 0 4  Household (dishes, doing bed, laundry) _0 Shopping _1 Walking level at own pace _2 Walking up Stairs _3 Total (40 - 48) Dyspnea Score _4 How bad is your cough? _5 How bad is your fatigue _6 [place]   0  vomit   0  diarrhea   1  anxiety   5  depression   5    Simple office walk 185 feet x  3 laps goal with forehead probe 03/15/2019 - uses 4L [place] at home  04/02/2020   O2 used Room air Walked on 3L [place]  Number laps completed 1/2 lap ad desat to 88%   Comments about pace    Resting Pulse Ox/HR  97% and 75  Final Pulse Ox/HR  88% and  108 at 2nd laps  Desaturated </= 88%    Desaturated <= 3% points    Got Tachycardic >/= 90/min    Symptoms at end of test    Miscellaneous comments -walked with 4L [place] - uses 4L [place] at home Needed 6L [place] to correct    PFT Results Latest Ref Rng & Units 04/01/2020 10/02/2019 05/17/2019 10/01/2018 09/03/2015  FVC-Pre L 1.60 1.62 1.82 1.81 2.18  FVC-Predicted Pre % 65 66 74 72 84  FVC-Post L - - - - 2.17  FVC-Predicted Post % - - - - 83  Pre FEV1/FVC % % 84 83 83 75 79  Post FEV1/FCV % % - - - - 83  FEV1-Pre L 1.35 1.34 1.51 1.36 1.73  FEV1-Predicted Pre % 73 73 82 73 88  FEV1-Post L - - - - 1.80  DLCO uncorrected ml/min/mmHg 11.37 10.42 11.28 11.94 14.82  DLCO UNC% % 66 60 65 69 73  DLCO corrected ml/min/mmHg 11.37 10.42 - - 14.06  DLCO COR %Predicted % 66 60 - - 69  DLVA Predicted % 101 106 99 98 94  TLC L - - - - 3.63  TLC % Predicted % - - - - 78  RV % Predicted % - - - - 61      ROS - per HPI     has a past medical history of Acute asthmatic bronchitis, Allergic rhinitis, Anxiety, Arthritis, Esophageal reflux, Hypertension, Interstitial lung disease ([place]) (dx jan 2020), Irritable bowel syndrome, Oxygen dependent, PONV (postoperative nausea and vomiting), Rheumatoid arthritis(714.0), and Sleep apnea.   reports that she has never smoked. She has never used smokeless tobacco.  Past Surgical History:  Procedure Laterality Date  .  c secttion  1977  . 2 foot fusions Left    total of 6 left foot sx  .  ABDOMINAL HYSTERECTOMY     complete   . ANKLE FUSION  2009   left  . BACK SURGERY     lower l to l 5 fused  . CHOLECYSTECTOMY    . RIGHT/LEFT HEART CATH AND CORONARY ANGIOGRAPHY N/A 09/16/2016   Procedure: Right/Left Heart Cath and Coronary Angiography;  Surgeon: Nelva Bush, MD;  Location: [facility];  Service: Cardiovascular;  Laterality: N/A;  . TONSILLECTOMY    . TOTAL HIP ARTHROPLASTY Right 12/20/2018   Procedure: TOTAL HIP ARTHROPLASTY ANTERIOR APPROACH;   Surgeon: Paralee Cancel, MD;  Location: [facility];  Service: Orthopedics;  Laterality: Right;  70 mins  . TOTAL KNEE ARTHROPLASTY Bilateral     Allergies  Allergen Reactions  . Cefdinir Diarrhea  . Erythromycin Base Diarrhea    Other  . Lactose     Diarrhea    . Lactose Intolerance (Gi) Diarrhea  . Lactulose Diarrhea  . Mesalamine Nausea Only  . Nitrofuran Derivatives     shakiness  . Nitrofurantoin Other (See Comments)    shakiness other  . Other Diarrhea and Other (See Comments)    Shaking uncontrollably "lettuce only" "lettuce only"  . Sulfa Antibiotics Other (See Comments)    Patient can't recall reaction   . Tdap [Tetanus-Diphth-Acell Pertussis]     Shaking uncontrollably  . Tetanus Toxoid, Adsorbed Other (See Comments)    Shaking uncontrollable     Immunization History  Administered Date(s) Administered  . Fluad Quad(high Dose 65+) 03/15/2019  . Influenza Split 03/18/2012, 03/18/2013, 04/14/2014, 04/17/2017  . Influenza Whole 04/30/2008, 04/20/2009, 04/18/2011  . Influenza, High Dose Seasonal PF 04/23/2015, 03/24/2016, 04/06/2018, 04/02/2020  . Influenza,inj,quad, With Preservative 04/06/2018  . PFIZER SARS-COV-2 Vaccination 08/24/2019, 09/18/2019  . Pneumococcal Conjugate-13 11/11/2013  . Pneumococcal Polysaccharide-23 05/21/2018  . Zoster Recombinat (Shingrix) 05/05/2017    Family History  Problem Relation Age of Onset  . Heart disease Mother   . Arthritis Mother   . Heart attack Father   . Diabetes Other        sibling  . Heart attack Other        sibling     Current Outpatient Medications:  .  albuterol (VENTOLIN HFA) 108 (90 Base) MCG/ACT inhaler, USE 2 PUFFS BY MOUTH EVERY  6 HOURS AS NEEDED, Disp: 34 g, Rfl: 2 .  aspirin EC 81 MG tablet, Take 81 mg by mouth daily., Disp: , Rfl:  .  benzonatate (TESSALON) 200 MG capsule, TAKE 1 CAPSULE BY MOUTH 3 TIMES A DAY AS NEEDED FOR COUGH (Patient taking differently: Take 200 mg by mouth 3 (three) times daily  as needed for cough. TAKE 1 CAPSULE BY MOUTH 3 TIMES A DAY AS NEEDED FOR COUGH), Disp: 30 capsule, Rfl: 3 .  cetirizine (ZYRTEC) 10 MG tablet, Take 10 mg by mouth daily., Disp: , Rfl:  .  clidinium-chlordiazePOXIDE (LIBRAX) 5-2.5 MG capsule, Take 1 capsule by mouth 2 (two) times daily as needed (for IBS)., Disp: , Rfl:  .  colestipol (COLESTID) 1 G tablet, Take 1 g by mouth 2 (two) times daily. , Disp: , Rfl:  .  dicyclomine (BENTYL) 20 MG tablet, Take 20 mg by mouth 4 (four) times daily -  before meals and at bedtime., Disp: , Rfl:  .  doxycycline (VIBRAMYCIN) 100 MG capsule, Take 100 mg by mouth 2 (two) times daily., Disp: , Rfl:  .  FLUoxetine (PROZAC) 20 MG capsule, Take 20 mg by mouth daily., Disp: , Rfl:  .  fluticasone (FLONASE)  50 MCG/ACT nasal spray, SPRAY 2 SPRAYS INTO EACH NOSTRIL EVERY DAY, Disp: 48 mL, Rfl: 3 .  Fluticasone-Umeclidin-Vilant (TRELEGY ELLIPTA) 100-62.5-25 MCG/INH AEPB, Inhale 1 puff into the lungs daily., Disp: 3 each, Rfl: 4 .  furosemide (LASIX) 40 MG tablet, Take 1 tablet (40 mg total) by mouth 2 (two) times daily., Disp: 180 tablet, Rfl: 3 .  hydroxychloroquine (PLAQUENIL) 200 MG tablet, Take 2 tablets (400 mg total) by mouth daily. Stop for 2 weeks, post-op., Disp: , Rfl:  .  ipratropium (ATROVENT) 0.03 % nasal spray, USE 1 TO 2 SPRAYS IN BOTH  NOSTRILS TWICE DAILY, Disp: 90 mL, Rfl: 3 .  ipratropium-albuterol (DUONEB) 0.5-2.5 (3) MG/3ML SOLN, USE 1 VIAL IN NEBULIZER EVERY 6 HOURS AS NEEDED *J45.901* npi [id number], Disp: 270 mL, Rfl: 11 .  leflunomide (ARAVA) 20 MG tablet, Take 20 mg by mouth daily. , Disp: , Rfl:  .  methocarbamol (ROBAXIN) 500 MG tablet, Take 1 tablet (500 mg total) by mouth every 6 (six) hours as needed for muscle spasms., Disp: 40 tablet, Rfl: 0 .  mirtazapine (REMERON) 45 MG tablet, Take 45 mg by mouth at bedtime., Disp: , Rfl:  .  montelukast (SINGULAIR) 10 MG tablet, TAKE 1 TABLET BY MOUTH AT  BEDTIME, Disp: 90 tablet, Rfl: 3 .  Multiple  Vitamin (MULTIVITAMIN) tablet, Take 1 tablet by mouth daily., Disp: , Rfl:  .  omeprazole (PRILOSEC) 40 MG capsule, Take 40 mg by mouth at bedtime. , Disp: , Rfl:  .  OXYGEN, Inhale 4 mLs into the lungs continuous. , Disp: , Rfl:  .  Pirfenidone (ESBRIET) 801 MG TABS, Take 801 mg by mouth 3 (three) times daily., Disp: 90 tablet, Rfl: 11 .  potassium chloride SA (KLOR-CON) 20 MEQ tablet, Take 1 tablet (20 mEq total) by mouth 2 (two) times daily., Disp: 180 tablet, Rfl: 3 .  predniSONE (DELTASONE) 5 MG tablet, Take 5 mg by mouth daily with breakfast., Disp: , Rfl:  .  rosuvastatin (CRESTOR) 20 MG tablet, Take 1 tablet (20 mg total) by mouth daily., Disp: 90 tablet, Rfl: 3 .  verapamil (CALAN-SR) 240 MG CR tablet, Take 1 tablet (240 mg total) by mouth daily., Disp: 90 tablet, Rfl: 3 .  celecoxib (CELEBREX) 200 MG capsule, Take 200 mg by mouth daily., Disp: , Rfl:       Objective:   Vitals:   04/02/20 1045  BP: 120/82  Pulse: 99  Temp: 98.2 F (36.8 C)  TempSrc: Oral  SpO2: 96%  Weight: 207 lb 9.6 oz (94.2 kg)  Height: 5' 1" (1.549 m)    Estimated body mass index is 39.23 kg/m as calculated from the following:   Height as of this encounter: 5' 1" (1.549 m).   Weight as of this encounter: 207 lb 9.6 oz (94.2 kg).  _0 @  Filed Weights   04/02/20 1045  Weight: 207 lb 9.6 oz (94.2 kg)     Physical Exam General: No distress. Obese. On o2 Neuro: Alert and Oriented x 3. GCS 15. Speech normal Psych: Pleasant Resp: Clear to ausucultation bilaterally. No wheeze No crackles. DISTANT BS CVS: Normal heart sounds. Murmurs - no HEENT: Normal upper airway. PEERL +. No post nasal drip Stigmata of CTD - RA +        Assessment:       ICD-10-CM   1. Chronic respiratory failure with hypoxia (HCC)  J96.11   2. Interstitial lung disease due to connective tissue disease ([place])  J84.89  M35.9   3. High risk medication use  Z79.899   4. Therapeutic drug monitoring  Z51.81   5.  Morbid obesity ([place])  E66.01   6. Obstructive sleep apnea  G47.33   7. Asthmatic bronchitis, moderate persistent, uncomplicated  G87.19   8. Flu vaccine need  Z23   9. Vaccine counseling  Z71.89   10. Interstitial pulmonary disease (HCC)  J84.9 CT Chest High Resolution  11. ILD (interstitial lung disease) (HCC)  J84.9 Pirfenidone (ESBRIET) 801 MG TABS       Plan:     Patient Instructions  Chronic respiratory failure with hypoxia (HCC) Interstitial lung disease due to connective tissue disease ([place]) High risk medication use Therapeutic drug monitoring  -My concern is that interstitial lung disease is getting worse  Plan -Get high-resolution CT chest supine and prone -Continue using oxygen 3 L at rest but at with exertion you might need 6 or even 8 L  -Monitor pulse ox with exertion and keep it over 86% -Continue participation in ILD-pro research registry  Morbid obesity ([place])  -Improving this can help your overall health  plan -Download an app called my fitness pal and set goal weight loss for 1 pound a month -Follow a low carbohydrate weight loss plan -we can go over more about this at follow-up  Obstructive sleep apnea  Plan -Continue CPAP  Asthmatic bronchitis, moderate persistent, uncomplicated  -Stable without exacerbation  Plan -Continue inhalers and Singulair as before  Flu vaccine need Vaccine counseling  Plan -Get high-dose flu shot today -You meet indications for [employer] vaccine booster and I recommend that because you are immunosuppressed on prednisone and have rheumatoid arthritis  Follow-up  -6-8 weeks but after CT scan of the chest and a 30-minute slot  -ILD symptom questionnaire at follow-up   ( Level 05 visit: Estb 40-54 min *  in  visit type: on-site physical face to visit  in total care time and counseling or/and coordination of care by this undersigned MD - Dr Brand Males. This includes one or more of the following on this same day  04/02/2020: pre-charting, chart review, note writing, documentation discussion of test results, diagnostic or treatment recommendations, prognosis, risks and benefits of management options, instructions, education, compliance or risk-factor reduction. It excludes time spent by the [place] or office staff in the care of the patient. Actual time 79 min)   SIGNATURE    Dr. Brand Males, M.D., F.C.C.P,  Pulmonary and Critical Care Medicine Staff Physician, [place] Director - Interstitial Lung Disease  Program  Pulmonary [place] at [place], [place], [address]  Pager: [phone], If no answer or between  15:00h - 7:00h: call [phone] Telephone: [phone]  1:05 PM 04/02/2020

## 2020-04-02 NOTE — Patient Instructions (Addendum)
Chronic respiratory failure with hypoxia (HCC) Interstitial lung disease due to connective tissue disease (HCC) High risk medication use Therapeutic drug monitoring  -My concern is that interstitial lung disease is getting worse  Plan -Get high-resolution CT chest supine and prone -Continue using oxygen 3 L at rest but at with exertion you might need 6 or even 8 L  -Monitor pulse ox with exertion and keep it over 86% -Continue participation in ILD-pro research registry  Morbid obesity ([place])  -Improving this can help your overall health  plan -Download an app called my fitness pal and set goal weight loss for 1 pound a month -Follow a low carbohydrate weight loss plan -we can go over more about this at follow-up  Obstructive sleep apnea  Plan -Continue CPAP  Asthmatic bronchitis, moderate persistent, uncomplicated  -Stable without exacerbation  Plan -Continue inhalers and Singulair as before  Flu vaccine need Vaccine counseling  Plan -Get high-dose flu shot today -You meet indications for [employer] vaccine booster and I recommend that because you are immunosuppressed on prednisone and have rheumatoid arthritis  Follow-up  -6-8 weeks but after CT scan of the chest and a 30-minute slot  -ILD symptom questionnaire at follow-up

## 2020-04-15 ENCOUNTER — Ambulatory Visit
Admission: RE | Admit: 2020-04-15 | Discharge: 2020-04-15 | Disposition: A | Discharge status: Home / Self Care | Payer: Medicare Other | Source: Ambulatory Visit | Attending: Pulmonary Disease | Admitting: Pulmonary Disease

## 2020-04-15 ENCOUNTER — Other Ambulatory Visit: Payer: Self-pay

## 2020-04-15 DIAGNOSIS — J849 Interstitial pulmonary disease, unspecified: Secondary | ICD-10-CM

## 2020-05-13 ENCOUNTER — Other Ambulatory Visit: Payer: Self-pay

## 2020-05-13 ENCOUNTER — Ambulatory Visit (INDEPENDENT_AMBULATORY_CARE_PROVIDER_SITE_OTHER): Payer: Medicare Other | Admitting: Internal Medicine

## 2020-05-13 ENCOUNTER — Encounter: Payer: Medicare Other | Admitting: *Deleted

## 2020-05-13 VITALS — BP 124/62 | HR 98 | Temp 97.9°F | Ht 60.0 in | Wt 202.8 lb

## 2020-05-13 DIAGNOSIS — J8489 Other specified interstitial pulmonary diseases: Secondary | ICD-10-CM

## 2020-05-13 DIAGNOSIS — Z79899 Other long term (current) drug therapy: Secondary | ICD-10-CM | POA: Diagnosis not present

## 2020-05-13 DIAGNOSIS — G4733 Obstructive sleep apnea (adult) (pediatric): Secondary | ICD-10-CM

## 2020-05-13 DIAGNOSIS — M359 Systemic involvement of connective tissue, unspecified: Secondary | ICD-10-CM | POA: Diagnosis not present

## 2020-05-13 DIAGNOSIS — I2510 Atherosclerotic heart disease of native coronary artery without angina pectoris: Secondary | ICD-10-CM

## 2020-05-13 DIAGNOSIS — J849 Interstitial pulmonary disease, unspecified: Secondary | ICD-10-CM

## 2020-05-13 DIAGNOSIS — Z5181 Encounter for therapeutic drug level monitoring: Secondary | ICD-10-CM

## 2020-05-13 DIAGNOSIS — Z7185 Encounter for immunization safety counseling: Secondary | ICD-10-CM

## 2020-05-13 LAB — HEPATIC FUNCTION PANEL
ALT: 23 U/L (ref 0–35)
AST: 23 U/L (ref 0–37)
Albumin: 4.1 g/dL (ref 3.5–5.2)
Alkaline Phosphatase: 74 U/L (ref 39–117)
Bilirubin, Direct: 0.1 mg/dL (ref 0.0–0.3)
Total Bilirubin: 0.3 mg/dL (ref 0.2–1.2)
Total Protein: 6.4 g/dL (ref 6.0–8.3)

## 2020-05-13 NOTE — Progress Notes (Signed)
05/21/2018- 74 year old female never smoker followed for allergic rhinitis, rhinosinusitis, asthmatic bronchitis, ILD, OSA,complicated by GERD/Crohn's, Rheumatoid arthritis/prednisone -----4 month follow up for asthma and OSA. Per patient she is still having issues with SOB. She has had 2 foot surgeries since her last visit. States that the [name] is working but [name] wanted to switch her to something else. Unsure of the medication name and I did not see it in his last OV.  Singulair, and Anoro, neb duoneb CPAP auto 5-15/Advanced   Download 100% compliance AHI 1.3/hour. I discussed FENO and she will try Symbicort. Discussed inhaled steroids.  We need to get her a new AeroChamber. Has been on sustained antibiotics for failed appliance in her foot. Notices some runny nose and scant bland mucus.  Wheezing has been well controlled.  Little cough.  She and her husband recognize inability to get any exercise as a substantial reason for dyspnea.  No acute events.  Cardiology continues to follow, including her pulmonary hypertension.  We reviewed her most recent chest CT and discussed implications of progressive interstitial fibrosis.  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  I would like to refer her to [name] for his opinion and advice. FENO 03/15/18  49 H CT chest Hi Res 02/02/2018- IMPRESSION: 1. Spectrum of findings compatible with fibrotic interstitial lung disease with a slight basilar predominance, with mild progression since 2017. 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 2. Mild patchy air trapping, unchanged, indicative of small airways disease. 3. Left main and two-vessel coronary atherosclerosis. 4. Stable dilated main pulmonary artery, suggesting chronic pulmonary arterial hypertension. Aortic Atherosclerosis (ICD10-I70.0).  06/08/18-  74 year old female never smoker followed for allergic rhinitis, rhinosinusitis, asthmatic bronchitis, ILD, OSA,complicated by GERD/Crohn's, Rheumatoid arthritis/prednisone -----coughing for 2 weeks, yellow, green mucus, SOB  Singulair, Atrovent nasal spray, Symbicort 160, and Anoro, neb albuterol, neb DuoNeb, Complains of increased cough with chest congestion, green/yellow sputum, low-grade fever 99 degrees over the last 2 weeks.  Not on prednisone now.  Last antibiotic in October. Pending ILD consultation with [name] in December.   07/02/2018  - Visit   74 year old female patient presenting today for acute visit.  Patient was recently treated with a course of Augmentin and reports sputum color is now light green but has decreased in sputum amount since last office visit.  Patient does not feel she is getting better.  Patient remains adherent to Anoro Ellipta.  On arrival to our office today patient was short of breath and oxygen saturations were 81%.  With deep breathing patient was able to increase oxygenation to 85%.  When placed on 2 L O2 patient was able to maintain at 91%.  Patient had previously been on oxygen in 2015 but this was later [facility].  Patient's weight has also increased over the previous office visits, patient remains adherent to diuretic treatment plan as on taking 1 diuretic daily.  Patient reports she is allowed to take an additional diuretic when she feels necessary.  Unfortunately patient has not been weighing herself regularly there is no set plan to when she should take her second diuretic.  Patient is exceptionally sedentary at home and rarely gets out of her bed or lazy boy chair which is about 3 feet from her bed.  Patient's husband takes care of patient completely including getting drinks and food so patient does not have to leave her lazy boy  recliner.  Patient watches many television shows and does not report much activity.   Patient has completed follow-up with  rheumatology last week and no changes to her medications were made.  Patient reports that she was stable at the time.  CPAP compliance report showing 30 out of 30 days used.  All 30 those days greater than 4 hours.  APAP settings 5-15.  Average usage days 7 hours and 56 minutes.  AHI 0.8.    OV 07/31/2018 -referred to interstitial lung disease clinic  Subjective:  [name], female , DOB: 1946/04/21 , age 53 y.o. , MRN: [id number] , ADDRESS: [address] [place] [address]   07/31/2018 -   Chief Complaint  Patient presents with  . Follow-up    Pt of [name] that stated to be referred to ILD clinic.  Pt currently has complaints of wheezing which she has had since December 2019, worsening SOB when moving around and also states she has a pain in left side of chest near shoulder. Pt wears between 2-4L O2 but mainly has been wearing 4L at home.      HPI [name] 74 y.o. -accompanied by her husband.  Referred by [name] pulmonologist in our practice for evaluation of interstitial lung disease in the setting of rheumatoid arthritis.  History is gained from her, talking to her husband, review of recent office visits and also the interstitial lung disease questionnaire.  As best as I can gather   [facility] ILD Questionnaire  Symptoms: She is known to have ILD on a CT chest in 2017 but she is not aware of it.  She says that she follows normally for sleep apnea and asthma with [name].  She recollects that approximately 11 or 12 years ago she was hospitalized for pneumonia and was on oxygen for short while not otherwise specified.  Then came off oxygen.  After that overall she is been stable and sedentary using her CPAP.  She is mostly sedentary because of obesity, rheumatoid arthritis and also because of the nonhealing ulcer in her feet for which she is been advised sedentary lifestyle to enable healing.  Most recently to  Thanksgiving 2019 she was seen for respiratory exacerbation and given antibiotics and prednisone.  She followed up mid December 2019 and was found to have new onset hypoxemia 85% on room air.  She was then discharged on portable oxygen which she is using 4 L nasal cannula at rest.  She tells me that at home when she comes off of CPAP she is in her 24s on room air at rest.  In fact in the office today she is 83% on room air at rest but 93% on 2 L nasal cannula at rest.  There is concerned that she has worsening ILD particularly summer 2019 on her CT chest had progressive findings compared to 2 years earlier.  The presumption is that this ILD is caused by rheumatoid arthritis.  In the 2019 CT chest CT is reported as probable UIP but in my personal visualization is either indeterminate or alternative diagnosis.  In terms of symptoms: She reports insidious onset of shortness of breath for some years and gradually getting worse.  Level 2 dyspnea at rest.  Level for dyspnea taking a shower.  Level 5 dyspnea walking at her own pace of walking with others of her age and walking up stairs or walking up a hill.  She does have  a cough with yellow and green sputum.  She does cough at night.  There is also wheezing  Past Medical History : She has longstanding history of rheumatoid arthritis since the 1980s.  Used to be followed by Dr. Danie Binder.  As best as I can gather she used to be on methotrexate maybe 10 or 20 years ago.  She took it for a short while and then stopped it because of diarrhea.  She took this for less than 1 year.  She believes that after that she was basically on pain management.  She is only been on prednisone chronic intake for a total of 1 year approximately 4 to 5 years ago.  Around this time Dr. Hoyt Koch retired and she started seeing Dr. Gavin Pound.  Since then she is been on Arava and Plaquenil.  She has never seen any other immunomodulators in the setting of rheumatoid arthritis  according to history.  Overall the rheumatoid arthritis has caused some disability with deformed joints in her hands and also her using cane.    She is not on any anticoagulation is never had any blood clots.  In 2017 she had vascular extremity duplex venous that was negative for DVT.  Most recently she is been dealing with nonhealing ulcer of the left toe/metatarsal .  She had August and September 2018.  And then 1 in the latter part of 2019.  Most recently she says she has had a duplex lower extremity venous.  I do not have access to these results.  She is waiting on this.  Was done in the last few to several days.   She also has a longstanding history of asthma for which she is on Singulair.  Exam nitric oxide was elevated in August 2019 at 49 ppb but 07/31/2018 I snormal in the 20s though she is wheezing   She has nonobstructive coronary artery disease and in March 2018 had a right heart catheterization with elevated pulmonary pressures but also slightly elevated wedge pressure [see below].  She is started on Lasix  She has sleep apnea for which she uses CPAP.  She has obesity  ROS: Positive for arthralgia and arthritis.  Dry eyes and dry mouth but otherwise negative   FAMILY HISTORY of LUNG DISEASE: Denies   EXPOSURE HISTORY: Never smoked any cigarettes, marijuana, vaping, cocaine or intravenous drug use   HOME and HOBBY DETAILS : [place]family home [place] lived there for 8 years.  No mold.  No dampness.  Does use humidifier and does use CPAP but there is no mold in it.  Also uses nebulizer machine but there is no mold.  No pet birds no musical instruments no guarding habits   OCCUPATIONAL HISTORY (122 questions) : Denies   PULMONARY TOXICITY HISTORY (27 items): She has taken nitrofurantoin 9 years ago.  Has not taken methotrexate for a year 5 years ago and is taken sulfasalazine       Results for [name], [name] (MRN [id number]) as of 07/31/2018 16:31  Ref. Range  09/03/2015 12:45 02/21/2017  FVC-Pre Latest Units: L 2.18 1.7L  FVC-%Pred-Pre Latest Units: % 84 66%   Results for [name], [name] (MRN [id number]) as of 07/31/2018 16:31  Ref. Range 09/03/2015 12:45  DLCO cor Latest Units: ml/min/mmHg 14.06  DLCO cor % pred Latest Units: % 69   HEART CATH MARCH 2018 Right Heart Pressures RA (mean): 13 mmHg RV (S/EDP): 38/12 mmHg PA (S/D, mean): 43/23 (32) mmHg PCWP (mean): 16 mmHg  Conclusions: 1. Mild to moderate, non-obstructive coronary artery disease with 50% mid LAD and 20% proximal RCA stenoses. 2. Mild to moderate pulmonary hypertension with mildly elevated right heart filling pressures. 3. Upper normal left heart filling pressures. 4. Normal Fick cardiac output/index. 5. Equalization of end-diastolic pressures with ventricular concordance. This can be seen in the setting of restrictive process.  Recommendations: 1. Escalate statin therapy to prevent progression of CAD. 2. Increase furosemide to 40 mg BID and KCl to 40 mg BID. Patient to have BMP in 1 week to evaluate renal function and potassium. 3. Continue outpatient pulmonary follow-up. I suspect underlying lung disease is the driving force behind the patient's dyspnea, pulmonary hypertension, and elevated right heart pressures. 4. Outpatient follow-up in cardiology clinic in ~6 weeks.  Nelva Bush, MD   CT chest high res July 2019 - visualized personally - personaly opinion - indeterminate or alt dx for UIP but agree with progression  IMPRESSION: Lungs/Pleura: No pneumothorax. No pleural effusion. No acute consolidative airspace disease or lung masses. Few scattered small solid pulmonary nodules in mid to upper right lung, largest 4 mm in the right upper lobe (series 3/image 46), unchanged since 03/14/2016 CT, considered benign. No new significant pulmonary nodules. Mild patchy air trapping in both lungs on the expiration sequence, unchanged. Patchy peribronchovascular and  subpleural reticulation and ground-glass attenuation throughout both lungs with associated mild traction bronchiectasis and mild architectural distortion. There is a slight basilar gradient to these findings. Tiny focus of honeycombing in anterior right upper lobe (series 8/image 59). These findings have mildly worsened since 03/14/2016 chest CT. 1. Spectrum of findings compatible with fibrotic interstitial lung disease with a slight basilar predominance, with mild progression since 2017. 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 2. Mild patchy air trapping, unchanged, indicative of small airways disease. 3. Left main and two-vessel coronary atherosclerosis. 4. Stable dilated main pulmonary artery, suggesting chronic pulmonary arterial hypertension.  Aortic Atherosclerosis (ICD10-I70.0).   Electronically Signed   By: Ilona Sorrel M.D.   On: 02/02/2018 14:03        OV 03/15/2019  Subjective:  [name], female , DOB: November 12, 1945 , age 74 y.o. , MRN: [id number] , ADDRESS: [address] [place] [address]   03/15/2019 -   Chief Complaint  Patient presents with  . IPD (Interstitial pulmonary disease)    Breathing is doing better since being on Esbriet. Has productive cough with green phelgm. Has been on antibiotic for last 6 weeks. Had hip replacement in June.     HPI TUYEN UNCAPHER 74 y.o. 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  Started on Esbriet in March 2020.  Has progressive phenotype.   She presents for routine follow-up with her husband.  She tells me that ever since starting Esbriet in the spring 2020 she has been doing well.  She does not feel that the disease has progressed.  In fact she feels somewhat better.  She continues to use 4 L of oxygen.  She is now taking the Esbriet at 1 pill  3 times daily.  This is the full dose of 1 pill.  Her last pulmonary function test was in early 2020.  It has been a while since she did her liver function test and she requires a repeat of that today.  She is willing to have her flu shot today.  She is  asking for prescription for emergency electric generator.  She does not know about the [employer] energy assistance program.  She has a handicap sticker.  She is interested in the ILD-PRO research registry study.    She is on Trelegy inhaler and Singulair I am not fully clear why   In June 2020 she had hip replacement for stress fracture and was on 6 weeks of Keflex.  She is slowly improving.  She uses a walker.  OV 05/17/2019  Subjective:  [name], female , DOB: [birth year] , age 84 y.o. , MRN: [id number] , AD[address]ESS: [address] [place] [address]   05/17/2019 -   Chief Complaint  Patient presents with  . Follow-up    Patient reports that she has had a productive cough with yellow sputum and her PCP placed her on an antibiotic.    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  Started on Esbriet in March 2020.  Has progressive phenotype. COVID negative 04/25/2019,. On 02 4L [place] since end 2019  Also hx of asthma  Also hx of chronic sinusitis - 2017 CT maxillary sinusits - DR Wilburn Cornelia  History of sleep apnea on CPAP  HPI [name] 74 y.o. -presents to the ILD clinic for the above issues.  She continues on pirfenidone since March 2020.  She says this is helping her.  No adverse effects.  Last liver function test was August 2020.  She needs another liver function test right now.  No nausea vomiting or diarrhea or weight loss.  She continues on 4 L of oxygen continuously 24/7.  At night she uses CPAP for her sleep apnea.  She and her husband think that she needs 6 L of oxygen at night and they are trying to increase the oxygen.  Although this is not based on data but on  subjective reasoning.    New issue associated with sinus issues: Her main issue today is that for the last 3-week she has had worsening of her cough and there is associated yellow sputum.  Her COVID-19 test was -2 days ago.  This been going on for 3 weeks.  She tells me this happens to her every winter.  She is complaining of chronic postnasal drainage.  In 2017 she had a CT scan of the sinus that showed maxillary sinusitis and saw Dr. Wilburn Cornelia but since then has not followed up.  She does not have a history of sinus surgery.  She is frustrated by the cough and the yellow sputum.  Her pulmonary function test itself is stable since she started the [place].  She is interested in the ILD pro registry study.  In terms of her asthma: She is on Trelegy and Singulair.  She does not want to stop this.  There is a clinical history.  She says that if she stops this it gets worse.   In terms of her sleep apnea: She continues on CPAP with oxygen at night.    ROS    SYMPTOM SCALE - ILD 03/15/2019  05/17/2019   O2 use x   Shortness of Breath 0 -> 5 scale with 5 being worst (score 6 If unable to do)   At rest 0 0  Simple tasks - showers, clothes change, eating, shaving 0 0  Household (dishes, doing bed, laundry) 5 6  Shopping 5 6  Walking level at own pace 3 2  Walking keeping up with others of same age 28  3  Walking up Stairs 5 3  Walking up [place] 5 6  Total (40 - 48) Dyspnea Score 25 26  How bad is your cough? 2 3  How bad is your fatigue 5 5    Simple office walk 185 feet x  3 laps goal with forehead probe 03/15/2019 - uses 4L [place] at home   O2 used Room air  Number laps completed 1/2 lap ad desat to 88%  Comments about pace   Resting Pulse Ox/HR   Final Pulse Ox/HR   Desaturated </= 88%   Desaturated <= 3% points   Got Tachycardic >/= 90/min   Symptoms at end of test   Miscellaneous comments -walked with 4L [place] - uses 4L [place] at home     Results for MYRENE, BOUGHER (MRN [id number]) as of  05/17/2019 10:56  Ref. Range 09/03/2015 12:45 10/01/2018 14:45 05/17/2019 08:59  FVC-Pre Latest Units: L 2.18 1.81 1.82  FVC-%Pred-Pre Latest Units: % 84 72 74  Results for DEBHORA, TITUS (MRN [id number]) as of 05/17/2019 10:56  Ref. Range 09/03/2015 12:45 10/01/2018 14:45 05/17/2019 08:59  DLCO unc Latest Units: ml/min/mmHg 14.82 11.94 11.28  DLCO unc % pred Latest Units: % 73 69 65    IMPRESSION: CT chest Jan  2020 1. Severe tracheobronchomalacia, more pronounced on today's scan. 2. Interval progression of basilar predominant fibrotic interstitial lung disease with particularly increased ground-glass component. No honeycombing. 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 Findings are consistent with UIP per consensus guidelines: Diagnosis of Idiopathic Pulmonary Fibrosis: An Official ATS/ERS/JRS/ALAT Clinical Practice Guideline. [place], Iss 5, [phone], Mar 18 2017. 3. Stable dilated main pulmonary artery, suggesting chronic pulmonary arterial hypertension. 4. Left main and 3 vessel coronary atherosclerosis.  Aortic Atherosclerosis (ICD10-I70.0).   Electronically Signed   By: Ilona Sorrel M.D.   On: 08/20/2018 08:21   - per HPI  OV 04/02/2020  Subjective:  [name], female , DOB: [birth year] , age 87 y.o. 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  Started on Esbriet in March 2020.  Has progressive phenotype. COVID negative 04/25/2019,. On 02 4L [place] since end 2019  - on ild pro registry  Also hx of asthma -Singulair and inhaler  Also hx of chronic sinusitis - 2017 CT maxillary sinusits - DR Wilburn Cornelia  History of sleep apnea on CPAP  Rheumatoid arthritis seen by Dr. Gavin Pound on leflunomide, Plaquenil and daily prednisone 5  mg/day.  04/02/2020 -   Chief Complaint  Patient presents with  . Follow-up    pt is here to go over pft results     HPI [name] 74 y.o. -presents for the above issues of interstitial lung disease in the setting of connective tissue disease.  She is on leflunomide and Plaquenil and prednisone with her rheumatologist.  She is on pirfenidone for her ILD.  I personally last saw her almost a year ago in October 2020.  After that she is seen nurse practitioner.  In the interim she says she is overall stable with the symptom score show a different story she has had significant worsening of her symptoms.  She continues to be morbidly obese.  Walking desaturation test she is desaturating to the point she is requiring 6 L to correct.  In fact  early in August 2020 [place] and was extremely short of breath.  Even at rest on 3 L she was 89%.  She continues to use a CPAP.  Her husband who is here with her today would like her to lose weight in order to help her health status.  Pulmonary function test shows also a decline.  Her last CT scan of the chest was in June 2020    [place] 05/13/2020   Subjective:  [name], female , DOB: 13-Aug-1945, age 74 y.o. years. 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    - Started on Esbriet in March 2020.  Has progressive phenotype. COVID negative 04/25/2019,. On 02 4L [place] since end 2019  - on ild pro registry  Also hx of asthma -Singulair and inhaler  Also hx of chronic sinusitis - 2017 CT maxillary  sinusits - DR Wilburn Cornelia  History of sleep apnea on CPAP  Rheumatoid arthritis seen by Dr. Gavin Pound on leflunomide, Plaquenil and daily prednisone 5 mg/day.  Elevated pulmonary artery pressures but PVR less than 3 in 2018: Right Heart Pressures RA (mean): 13 mmHg RV (S/EDP): 38/12 mmHg PA (S/D, mean): 43/23 (32) mmHg PCWP (mean): 16 mmHg  Ao sat: 94% PA sat: 70% RA sat: 75%  Fick CO: 6.4 L/min Fick CI: 3.7 L/min/m^2  PVR: 2.5 [place] units     Chief Complaint  Patient presents with  . Follow-up    more sob walking shorter distances.  coughing up yellow phlegm.       HPI [name] 74 y.o. -last visit September 2021.  At that time concern for progressive ILD.  Therefore we decided to PFTs and also high-resolution CT chest.  The high-resolution CT chest compared to last year shows stability.  The PFTs show stability within this year but progression compared to last year.  She is also lost weight following a low carbohydrate diet.  Nevertheless she feels the weight of her symptoms.  She is also using oxygen with exertion.  She feels this is getting worse.  Although her overall symptom burden seems to be improved with the weight loss.  Early morning she says there are some yellow phlegm which is stable for many many years.  This is mostly sinus.  There is no change in this.  She continues on immunosuppressants and antifibrotic.  There is no side effects from these.  She is willing to have labs checked.  She has had a Covid vaccine including booster.  She does not know if she has antibody response yet.  She is willing to get this checked.  She is looking at options to get better.  She will participate in the ILD-pro registry visit today.  Review of the records indicate the last right eye catheterization was in 2018.  Pressures were borderline at that time.  She is willing to get this checked.  Her cardiologist is in [place].  She wants to get the procedure done in [place] and  is willing to see Dr. Haroldine Laws or Dr. Aundra Dubin who are well versed with right heart catheterization locally.  SYMPTOM SCALE - ILD 03/15/2019  05/17/2019  04/02/2020  05/13/2020 Last Weight  Most recent update: 05/13/2020 12:10 PM   Weight  92 kg (202 lb 12.8 oz)             O2 use x     Shortness of Breath 0 -> 5 scale with 5 being worst (score 6 If unable to do)     At rest 0 0 4 0  Simple tasks - showers, clothes change, eating, shaving 0 0 4 2  Household (dishes, doing bed, laundry) _0 Shopping _1 Walking level at own pace _2 Walking up Stairs _3 Total (40 - 48) Dyspnea Score _4 How bad is your cough? _5 0  How bad is your fatigue _6 [place]   0 0  vomit   0 0  diarrhea   1 2  anxiety   5 3  depression   5 3    Simple office walk 185 feet x  3 laps goal with forehead probe 03/15/2019 - uses 4L [place] at home  04/02/2020  05/13/2020 92% on RA, 4L [place] when walking in 91%  O2 used Room air Walked on 3L [place]   Number laps completed 1/2 lap ad desat to 88%    Comments about pace     Resting Pulse Ox/HR  97% and 75   Final Pulse Ox/HR  88% and 108 at 2nd laps   Desaturated </= 88%     Desaturated <= 3% points     Got Tachycardic >/= 90/min     Symptoms at end of test     Miscellaneous comments -walked with 4L [place] - uses 4L [place] at home Needed 6L [place] to correct    Wt Readings from Last 3 Encounters:  05/13/20 202 lb 12.8 oz (92 kg)  04/02/20 207 lb 9.6 oz (94.2 kg)  02/05/20 218 lb (98.9 kg)      PFT Results Latest Ref Rng & Units 04/01/2020 10/02/2019 05/17/2019 10/01/2018 09/03/2015  FVC-Pre L 1.60 1.62 1.82 1.81 2.18  FVC-Predicted Pre % 65 66 74 72 84  FVC-Post L - - - - 2.17  FVC-Predicted Post % - - - - 83  Pre FEV1/FVC % % 84 83 83 75 79  Post FEV1/FCV % % - - - - 83  FEV1-Pre L 1.35 1.34 1.51 1.36 1.73  FEV1-Predicted Pre % 73 73 82 73 88  FEV1-Post L - - - - 1.80  DLCO uncorrected ml/min/mmHg 11.37 10.42 11.28 11.94 14.82   DLCO UNC% % 66 60 65 69 73  DLCO corrected ml/min/mmHg 11.37 10.42 - - 14.06  DLCO COR %Predicted % 66 60 - - 69  DLVA Predicted % 101 106 99 98 94  TLC L - - - - 3.63  TLC % Predicted % - - - - 78  RV % Predicted % - - - - 61    TECHNIQUE: CT chest sept 2021 Multidetector CT imaging of the chest was performed following the standard protocol without intravenous contrast. High resolution imaging of the lungs, as well as inspiratory and expiratory imaging, was performed.  COMPARISON:  08/16/2018, 02/02/2018, 03/14/2016, 08/27/2015  FINDINGS: Cardiovascular: Aortic atherosclerosis. Normal heart size. Three-vessel coronary artery calcifications. No pericardial effusion.  Mediastinum/Nodes: No enlarged mediastinal, hilar, or axillary lymph nodes. Thyroid gland, trachea, and esophagus demonstrate no significant  findings.  Lungs/Pleura: Unchanged moderate pulmonary fibrosis in a pattern with apical to basal gradient featuring irregular peripheral interstitial opacity, septal thickening, ground-glass, and minimal tubular bronchiectasis without evidence of bronchiolectasis or honeycombing. Fibrotic findings are not significantly changed compared to immediate prior examination but significantly worsened over time on examinations dating back to 08/27/2015 occasional stable, definitively benign small pulmonary nodules, for example a 3 mm nodule of the anterior right upper lobe (series 10, image 77). No significant air trapping on expiratory phase imaging. No pleural effusion or pneumothorax.  Upper Abdomen: No acute abnormality.  Musculoskeletal: No chest wall mass or suspicious bone lesions identified.  IMPRESSION: 1. Unchanged moderate pulmonary fibrosis in a pattern with apical to basal gradient featuring irregular peripheral interstitial opacity, septal thickening, ground-glass, and minimal tubular bronchiectasis without evidence of bronchiolectasis or honeycombing.  Fibrotic findings are not significantly changed compared to immediate prior examination but significantly worsened over time on examinations dating back to occasional 08/27/2015. 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 2. Coronary artery disease.  Aortic Atherosclerosis (ICD10-I70.0).   Electronically Signed   By: Eddie Candle M.D.   On: 04/15/2020 15:35    ROS - per HPI    has a past medical history of Acute asthmatic bronchitis, Allergic rhinitis, Anxiety, Arthritis, Esophageal reflux, Hypertension, Interstitial lung disease ([place]) (dx jan 2020), Irritable bowel syndrome, Oxygen dependent, PONV (postoperative nausea and vomiting), Rheumatoid arthritis(714.0), and Sleep apnea.   reports that she has never smoked. She has never used smokeless tobacco.  Past Surgical History:  Procedure Laterality Date  .  c secttion  1977  . 2 foot fusions Left    total of 6 left foot sx  . ABDOMINAL HYSTERECTOMY     complete   . ANKLE FUSION  2009   left  . BACK SURGERY     lower l to l 5 fused  . CHOLECYSTECTOMY    . RIGHT/LEFT HEART CATH AND CORONARY ANGIOGRAPHY N/A 09/16/2016   Procedure: Right/Left Heart Cath and Coronary Angiography;  Surgeon: Nelva Bush, MD;  Location: [facility];  Service: Cardiovascular;  Laterality: N/A;  . TONSILLECTOMY    . TOTAL HIP ARTHROPLASTY Right 12/20/2018   Procedure: TOTAL HIP ARTHROPLASTY ANTERIOR APPROACH;  Surgeon: Paralee Cancel, MD;  Location: [facility];  Service: Orthopedics;  Laterality: Right;  70 mins  . TOTAL KNEE ARTHROPLASTY Bilateral     Allergies  Allergen Reactions  . Cefdinir Diarrhea  . Erythromycin Base Diarrhea    Other  . Lactose     Diarrhea    . Lactose Intolerance (Gi) Diarrhea  . Lactulose Diarrhea  . Mesalamine Nausea Only  . Nitrofuran  Derivatives     shakiness  . Nitrofurantoin Other (See Comments)    shakiness other  . Other Diarrhea and Other (See Comments)    Shaking uncontrollably "lettuce only" "lettuce only"  . Sulfa Antibiotics Other (See Comments)    Patient can't recall reaction   . Tdap [Tetanus-Diphth-Acell Pertussis]     Shaking uncontrollably  . Tetanus Toxoid, Adsorbed Other (See Comments)    Shaking uncontrollable     Immunization History  Administered Date(s) Administered  . Fluad Quad(high Dose 65+) 03/15/2019  . Influenza Split 03/18/2012, 03/18/2013, 04/14/2014, 04/17/2017  . Influenza Whole 04/30/2008, 04/20/2009, 04/18/2011  . Influenza, High Dose Seasonal PF 04/23/2015, 03/24/2016, 04/06/2018, 04/02/2020  . Influenza,inj,quad, With Preservative 04/06/2018  .  PFIZER SARS-COV-2 Vaccination 08/24/2019, 09/18/2019, 04/13/2020  . Pneumococcal Conjugate-13 11/11/2013  . Pneumococcal Polysaccharide-23 05/21/2018  . Zoster Recombinat (Shingrix) 05/05/2017    Family History  Problem Relation Age of Onset  . Heart disease Mother   . Arthritis Mother   . Heart attack Father   . Diabetes Other        sibling  . Heart attack Other        sibling     Current Outpatient Medications:  .  albuterol (VENTOLIN HFA) 108 (90 Base) MCG/ACT inhaler, USE 2 PUFFS BY MOUTH EVERY  6 HOURS AS NEEDED, Disp: 34 g, Rfl: 2 .  aspirin EC 81 MG tablet, Take 81 mg by mouth daily., Disp: , Rfl:  .  benzonatate (TESSALON) 200 MG capsule, TAKE 1 CAPSULE BY MOUTH 3 TIMES A DAY AS NEEDED FOR COUGH (Patient taking differently: Take 200 mg by mouth 3 (three) times daily as needed for cough. TAKE 1 CAPSULE BY MOUTH 3 TIMES A DAY AS NEEDED FOR COUGH), Disp: 30 capsule, Rfl: 3 .  cetirizine (ZYRTEC) 10 MG tablet, Take 10 mg by mouth daily., Disp: , Rfl:  .  clidinium-chlordiazePOXIDE (LIBRAX) 5-2.5 MG capsule, Take 1 capsule by mouth 2 (two) times daily as needed (for IBS)., Disp: , Rfl:  .  colestipol (COLESTID) 1 G  tablet, Take 1 g by mouth 2 (two) times daily. , Disp: , Rfl:  .  dicyclomine (BENTYL) 20 MG tablet, Take 20 mg by mouth 4 (four) times daily -  before meals and at bedtime., Disp: , Rfl:  .  FLUoxetine (PROZAC) 20 MG capsule, Take 20 mg by mouth daily., Disp: , Rfl:  .  fluticasone (FLONASE) 50 MCG/ACT nasal spray, SPRAY 2 SPRAYS INTO EACH NOSTRIL EVERY DAY, Disp: 48 mL, Rfl: 3 .  Fluticasone-Umeclidin-Vilant (TRELEGY ELLIPTA) 100-62.5-25 MCG/INH AEPB, Inhale 1 puff into the lungs daily., Disp: 3 each, Rfl: 4 .  furosemide (LASIX) 40 MG tablet, Take 1 tablet (40 mg total) by mouth 2 (two) times daily., Disp: 180 tablet, Rfl: 3 .  hydroxychloroquine (PLAQUENIL) 200 MG tablet, Take 2 tablets (400 mg total) by mouth daily. Stop for 2 weeks, post-op., Disp: , Rfl:  .  ipratropium (ATROVENT) 0.03 % nasal spray, USE 1 TO 2 SPRAYS IN BOTH  NOSTRILS TWICE DAILY, Disp: 90 mL, Rfl: 3 .  ipratropium-albuterol (DUONEB) 0.5-2.5 (3) MG/3ML SOLN, USE 1 VIAL IN NEBULIZER EVERY 6 HOURS AS NEEDED *J45.901* npi [id number], Disp: 270 mL, Rfl: 11 .  leflunomide (ARAVA) 20 MG tablet, Take 20 mg by mouth daily. , Disp: , Rfl:  .  methocarbamol (ROBAXIN) 500 MG tablet, Take 1 tablet (500 mg total) by mouth every 6 (six) hours as needed for muscle spasms., Disp: 40 tablet, Rfl: 0 .  mirtazapine (REMERON) 45 MG tablet, Take 45 mg by mouth at bedtime., Disp: , Rfl:  .  montelukast (SINGULAIR) 10 MG tablet, TAKE 1 TABLET BY MOUTH AT  BEDTIME, Disp: 90 tablet, Rfl: 3 .  Multiple Vitamin (MULTIVITAMIN) tablet, Take 1 tablet by mouth daily., Disp: , Rfl:  .  omeprazole (PRILOSEC) 40 MG capsule, Take 40 mg by mouth at bedtime. , Disp: , Rfl:  .  OXYGEN, Inhale 4 mLs into the lungs continuous. , Disp: , Rfl:  .  Pirfenidone (ESBRIET) 801 MG TABS, Take 801 mg by mouth 3 (three) times daily., Disp: 90 tablet, Rfl: 11 .  potassium chloride SA (KLOR-CON) 20 MEQ tablet, Take 1 tablet (20 mEq total) by mouth  2 (two) times daily., Disp:  180 tablet, Rfl: 3 .  predniSONE (DELTASONE) 5 MG tablet, Take 5 mg by mouth daily with breakfast., Disp: , Rfl:  .  rosuvastatin (CRESTOR) 20 MG tablet, Take 1 tablet (20 mg total) by mouth daily., Disp: 90 tablet, Rfl: 3 .  verapamil (CALAN-SR) 240 MG CR tablet, Take 1 tablet (240 mg total) by mouth daily., Disp: 90 tablet, Rfl: 3 .  celecoxib (CELEBREX) 200 MG capsule, Take 200 mg by mouth daily., Disp: , Rfl:  .  doxycycline (VIBRAMYCIN) 100 MG capsule, Take 100 mg by mouth 2 (two) times daily., Disp: , Rfl:       Objective:   Vitals:   05/13/20 1207  BP: 124/62  Pulse: 98  Temp: 97.9 F (36.6 C)  TempSrc: Temporal  SpO2: 91%  Weight: 202 lb 12.8 oz (92 kg)  Height: 5' (1.524 m)    Estimated body mass index is 39.61 kg/m as calculated from the following:   Height as of this encounter: 5' (1.524 m).   Weight as of this encounter: 202 lb 12.8 oz (92 kg).  _0 @  [facility] Weights   05/13/20 1207  Weight: 202 lb 12.8 oz (92 kg)     Physical Exam  General: No distress. obse Neuro: Alert and Oriented x 3. GCS 15. Speech normal Psych: Pleasant Resp:  Barrel Chest - no.  Wheeze - no, Crackles - yes, No overt respiratory distress CVS: Normal heart sounds. 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 PEERL +. No post nasal drip        Assessment:       ICD-10-CM   1. Interstitial lung disease due to connective tissue disease ([place])  J84.89 Hepatic function panel   M35.9 SAR CoV2 Serology (COVID 19)AB(IGG)IA    Hepatic function panel    Ambulatory referral to Cardiology  2. High risk medication use  Z79.899   3. Therapeutic drug monitoring  Z51.81   4. Morbid obesity ([place])  E66.01   5. Obstructive sleep apnea  G47.33   6. Vaccine counseling  Z71.85        Plan:     Patient Instructions  Chronic respiratory failure with hypoxia ([place]) Interstitial lung disease due to connective tissue disease ([place]) High risk  medication use Therapeutic drug monitoring  -Interstitial lung disease worse definitely over the last few years but seems stable this calendar year 2021 suggesting current medication regimen is helping  -You might have associated pulmonary hypertension -based on 2018 cath and recent progression.  If so you could benefit from inhaled treprostinil  Plan --Continue using oxygen 3 L at rest and 4 L with exertion but you might need 6 or even 8 L  -Monitor pulse ox with exertion and keep it over 86% -Continue participation in ILD-pro research registry -Continue pirfenidone -Check liver function test today -Refer to Dr. Haroldine Laws or Dr. Einar Crow for right heart catheterization  Morbid obesity ([place])  -Congratulations on continued weight loss  plan -Keep up the good work with the app and also low carbohydrate diet -Continue to lose weight  Obstructive sleep apnea  Plan -Continue CPAP  Asthmatic bronchitis, moderate persistent, uncomplicated  -Stable without exacerbation but noted that you do have some mild yellow phlegm at baseline  Plan -Continue inhalers and Singulair as before -Continue nasal hygiene -Any change in color or volume of phlegm then call us for early antibiotics and prednisone  Vaccine counseling  -Glad  you got the Covid booster  Plan -Check Covid blood IgG to check antibody response to the booster vaccine  Follow-up  -12 weeks  and a 30-minute slot  -ILD symptom questionnaire at follow-up    ( Level 05 visit: Estb 40-54 min  in  visit type: on-site physical face to visit  in total care time and counseling or/and coordination of care by this undersigned MD - Dr Brand Males. This includes one or more of the following on this same day 05/13/2020: pre-charting, chart review, note writing, documentation discussion of test results, diagnostic or treatment recommendations, prognosis, risks and benefits of management options, instructions, education, compliance  or risk-factor reduction. It excludes time spent by the [place] or office staff in the care of the patient. Actual time 75 min)   SIGNATURE    Dr. Brand Males, M.D., F.C.C.P,  Pulmonary and Critical Care Medicine Staff Physician, [place] Director - Interstitial Lung Disease  Program  [facility] [place], [place], [address]  Pager: [phone], If no answer or between  15:00h - 7:00h: call [phone] Telephone: [phone]  3:21 PM 05/13/2020

## 2020-05-13 NOTE — Research (Signed)
Title: Chronic Fibrosing Interstitial Lung Disease with Progressive Phenotype Prospective Outcomes (ILD-PRO) Registry   Protocol #: IPF-[id number], Clinical Trials # [id number], Sponsor: [employer]  Protocol Version Amendment 4 dated 12Sep2019  and confirmed current on 05/13/2020 Consent Version for today's visit date of 05/13/2020  is Version Amendment 4 [phone])   Clinical Research Coordinator / Research RN note : This visit for [name] with DOB: 1945/08/04 on 05/13/2020 for the above protocol is Visit/Encounter #12 Month Follow-upand is for purpose of research. The consent for this encounter is under Protocol Version Amendment 4 (12Sep2019)andiscurrently IRB approved. Subject expressed continued interest and consent in continuing as a study subject. Subject confirmed that there was nochange in contact information (e.g. address, telephone, email). Subject thanked for participation in research and contribution to science.   During this visiton 05/13/2020, the subject completed the blood work and questionnaires per the above referenced protocol. Please refer to the subject's paper source binder for further details.  Signed by  [place], [place] 3:11 PM 05/13/2020

## 2020-05-13 NOTE — Progress Notes (Signed)
LFT normal  Lab             05/13/20                      1245         AST          23           ALT          23           ALKPHOS      74           BILITOT      0.3          PROT         6.4          ALBUMIN      4.1

## 2020-05-13 NOTE — Patient Instructions (Addendum)
Chronic respiratory failure with hypoxia ([facility]) Interstitial lung disease due to connective tissue disease ([facility]) High risk medication use Therapeutic drug monitoring  -Interstitial lung disease worse definitely over the last few years but seems stable this calendar year 2021 suggesting current medication regimen is helping  -You might have associated pulmonary hypertension -based on 2018 cath and recent progression.  If so you could benefit from inhaled treprostinil  Plan --Continue using oxygen 3 L at rest and 4 L with exertion but you might need 6 or even 8 L  -Monitor pulse ox with exertion and keep it over 86% -Continue participation in ILD-pro research registry -Continue pirfenidone -Check liver function test today -Refer to Dr. Haroldine Laws or Dr. Einar Crow for right heart catheterization  Morbid obesity ([place])  -Congratulations on continued weight loss  plan -Keep up the good work with the app and also low carbohydrate diet -Continue to lose weight  Obstructive sleep apnea  Plan -Continue CPAP  Asthmatic bronchitis, moderate persistent, uncomplicated  -Stable without exacerbation but noted that you do have some mild yellow phlegm at baseline  Plan -Continue inhalers and Singulair as before -Continue nasal hygiene -Any change in color or volume of phlegm then call us for early antibiotics and prednisone  Vaccine counseling  -Glad you got the Covid booster  Plan -Check Covid blood IgG to check antibody response to the booster vaccine  Follow-up  -12 weeks  and a 30-minute slot  -ILD symptom questionnaire at follow-up

## 2020-05-14 LAB — SARS-COV-2 ANTIBODY(IGG)SPIKE,SEMI-QUANTITATIVE: SARS COV1 AB(IGG)SPIKE,SEMI QN: >20 index — ABNORMAL HIGH (ref <1.00)

## 2020-06-13 NOTE — Progress Notes (Signed)
[facility] Note  06/16/2020     CHIEF COMPLAINT Patient presents for Retina Evaluation   HISTORY OF PRESENT ILLNESS: [name] is a 74 y.o. female who presents to the clinic today for:   HPI    Retina Evaluation    In both eyes.  Associated Symptoms Floaters and Glare.  I, the attending physician,  performed the HPI with the patient and updated documentation appropriately.          Comments    Patient here for Retinal Evaluation. Referred by Dr Trudie Reed. Patient states vision comes and goes. Can see things cloudy here and there. No eye pain. Sees pretty good with bright lights. Has RA. Been on plaquneil for 4 years. On O2.        Last edited by Bernarda Caffey, MD on 06/16/2020 11:53 AM. (History)    pt is here on the referral of her Rheumatologist, Dr. Trudie Reed, who has had her on Plaquenil 200 mg BID for 4 years, pt states she had an appt with Dr. Noel Christmas and was told she has macular degeneration and should not be on Plaquenil, pt states he told her to follow up in a year, but pt was worried about the dx and is seeking a second opinion here, pt is also taking 30m of prednisone Qdaily, pt states she has pulmonary fibrosis that is associated with her arthritis  Referring physician: HGavin Pound MD [address] [place]  [place] [id number] HISTORICAL INFORMATION:   Selected notes from the MEDICAL RECORD NUMBER Referred by Dr. LTruman Hayward  Ocular Hx- PMH-    CURRENT MEDICATIONS: No current outpatient medications on file. (Ophthalmic Drugs)   No current facility-administered medications for this visit. (Ophthalmic Drugs)   Current Outpatient Medications (Other)  Medication Sig  . albuterol (VENTOLIN HFA) 108 (90 Base) MCG/ACT inhaler USE 2 PUFFS BY MOUTH EVERY  6 HOURS AS NEEDED  . aspirin EC 81 MG tablet Take 81 mg by mouth daily.  . benzonatate (TESSALON) 200 MG capsule TAKE 1 CAPSULE BY MOUTH 3 TIMES A DAY AS NEEDED FOR COUGH  (Patient taking differently: Take 200 mg by mouth 3 (three) times daily as needed for cough. TAKE 1 CAPSULE BY MOUTH 3 TIMES A DAY AS NEEDED FOR COUGH)  . celecoxib (CELEBREX) 200 MG capsule Take 200 mg by mouth daily.  . cetirizine (ZYRTEC) 10 MG tablet Take 10 mg by mouth daily.  . clidinium-chlordiazePOXIDE (LIBRAX) 5-2.5 MG capsule Take 1 capsule by mouth 2 (two) times daily as needed (for IBS).  . colestipol (COLESTID) 1 G tablet Take 1 g by mouth 2 (two) times daily.   .Marland Kitchendicyclomine (BENTYL) 20 MG tablet Take 20 mg by mouth 4 (four) times daily -  before meals and at bedtime.  .Marland Kitchendoxycycline (VIBRAMYCIN) 100 MG capsule Take 100 mg by mouth 2 (two) times daily.  .Marland KitchenFLUoxetine (PROZAC) 20 MG capsule Take 20 mg by mouth daily.  . fluticasone (FLONASE) 50 MCG/ACT nasal spray SPRAY 2 SPRAYS INTO EACH NOSTRIL EVERY DAY  . Fluticasone-Umeclidin-Vilant (TRELEGY ELLIPTA) 100-62.5-25 MCG/INH AEPB Inhale 1 puff into the lungs daily.  . furosemide (LASIX) 40 MG tablet Take 1 tablet (40 mg total) by mouth 2 (two) times daily.  . hydroxychloroquine (PLAQUENIL) 200 MG tablet Take 2 tablets (400 mg total) by mouth daily. Stop for 2 weeks, post-op.  .Marland Kitchenipratropium (ATROVENT) 0.03 % nasal spray USE 1 TO 2 SPRAYS IN BOTH  NOSTRILS TWICE DAILY  . ipratropium-albuterol (DUONEB) 0.5-2.5 (3) MG/3ML SOLN USE 1 VIAL IN NEBULIZER EVERY 6 HOURS AS NEEDED *J45.901* npi [id number]  . leflunomide (ARAVA) 20 MG tablet Take 20 mg by mouth daily.   . methocarbamol (ROBAXIN) 500 MG tablet Take 1 tablet (500 mg total) by mouth every 6 (six) hours as needed for muscle spasms.  . mirtazapine (REMERON) 45 MG tablet Take 45 mg by mouth at bedtime.  . montelukast (SINGULAIR) 10 MG tablet TAKE 1 TABLET BY MOUTH AT  BEDTIME  . Multiple Vitamin (MULTIVITAMIN) tablet Take 1 tablet by mouth daily.  Marland Kitchen omeprazole (PRILOSEC) 40 MG capsule Take 40 mg by mouth at bedtime.   . OXYGEN Inhale 4 mLs into the lungs continuous.   . Pirfenidone  (ESBRIET) 801 MG TABS Take 801 mg by mouth 3 (three) times daily.  . potassium chloride SA (KLOR-CON) 20 MEQ tablet Take 1 tablet (20 mEq total) by mouth 2 (two) times daily.  . predniSONE (DELTASONE) 5 MG tablet Take 5 mg by mouth daily with breakfast.  . rosuvastatin (CRESTOR) 20 MG tablet Take 1 tablet (20 mg total) by mouth daily.  . verapamil (CALAN-SR) 240 MG CR tablet Take 1 tablet (240 mg total) by mouth daily.   No current facility-administered medications for this visit. (Other)      REVIEW OF SYSTEMS: ROS    Positive for: Gastrointestinal, Cardiovascular, Eyes, Respiratory   Last edited by Theodore Demark, COA on 06/16/2020  8:58 AM. (History)       ALLERGIES Allergies  Allergen Reactions  . Cefdinir Diarrhea  . Erythromycin Base Diarrhea    Other  . Lactose     Diarrhea    . Lactose Intolerance (Gi) Diarrhea  . Lactulose Diarrhea  . Mesalamine Nausea Only  . Nitrofuran Derivatives     shakiness  . Nitrofurantoin Other (See Comments)    shakiness other  . Other Diarrhea and Other (See Comments)    Shaking uncontrollably "lettuce only" "lettuce only"  . Sulfa Antibiotics Other (See Comments)    Patient can't recall reaction   . Tdap [Tetanus-Diphth-Acell Pertussis]     Shaking uncontrollably  . Tetanus Toxoid, Adsorbed Other (See Comments)    Shaking uncontrollable     PAST MEDICAL HISTORY Past Medical History:  Diagnosis Date  . Acute asthmatic bronchitis   . Allergic rhinitis   . Anxiety   . Arthritis   . Esophageal reflux   . Hypertension   . Interstitial lung disease ([place]) dx jan 2020  . Irritable bowel syndrome   . Oxygen dependent    4 liters day time 6 liters at night  . PONV (postoperative nausea and vomiting)    ponv likes zofran, and scopolamine patch  . Rheumatoid arthritis(714.0)   . Sleep apnea    Past Surgical History:  Procedure Laterality Date  .  c secttion  1977  . 2 foot fusions Left    total of 6 left foot sx  .  ABDOMINAL HYSTERECTOMY     complete   . ANKLE FUSION  2009   left  . BACK SURGERY     lower l to l 5 fused  . CHOLECYSTECTOMY    . RIGHT/LEFT HEART CATH AND CORONARY ANGIOGRAPHY N/A 09/16/2016   Procedure: Right/Left Heart Cath and Coronary Angiography;  Surgeon: Nelva Bush, MD;  Location: [facility];  Service: Cardiovascular;  Laterality: N/A;  . TONSILLECTOMY    . TOTAL HIP ARTHROPLASTY Right 12/20/2018  Procedure: TOTAL HIP ARTHROPLASTY ANTERIOR APPROACH;  Surgeon: Paralee Cancel, MD;  Location: [facility];  Service: Orthopedics;  Laterality: Right;  70 mins  . TOTAL KNEE ARTHROPLASTY Bilateral     FAMILY HISTORY Family History  Problem Relation Age of Onset  . Heart disease Mother   . Arthritis Mother   . Heart attack Father   . Diabetes Other        sibling  . Heart attack Other        sibling    SOCIAL HISTORY Social History   Tobacco Use  . Smoking status: Never Smoker  . Smokeless tobacco: Never Used  . Tobacco comment: positive passive tobacco smoke exposure  Vaping Use  . Vaping Use: Never used  Substance Use Topics  . Alcohol use: Yes    Comment: glass of wine each night   . Drug use: No         OPHTHALMIC EXAM:  Base Eye Exam    Visual Acuity (Snellen - Linear)      Right Left   Dist [place] 20/40 20/25 -1   Dist ph [place] 20/30 NI       Tonometry (Tonopen, 8:52 AM)      Right Left   Pressure 15 13       Pupils      Dark Light Shape React APD   Right 3 2 Round Minimal None   Left 3 2 Round Minimal None       Visual Fields (Counting fingers)      Left Right    Full Full       Extraocular Movement      Right Left    Full, Ortho Full, Ortho       Neuro/Psych    Oriented x3: Yes   Mood/Affect: Normal       Dilation    Both eyes: 1.0% Mydriacyl, 2.5% Phenylephrine @ 8:52 AM        Slit Lamp and Fundus Exam    Slit Lamp Exam      Right Left   Lids/Lashes Normal Normal   Conjunctiva/Sclera White and quiet White and quiet    Cornea arcus, 3+ Punctate epithelial erosions, Debris in tear film arcus, 3+ Punctate epithelial erosions, Debris in tear film   Anterior Chamber Deep and quiet Deep and quiet   Iris Round and dilated Round and dilated   Lens PC IOL in good position with open PC PC IOL in good position with trace PCO   Vitreous Vitreous syneresis, Posterior vitreous detachment, vitreous condensations Vitreous syneresis, Posterior vitreous detachment       Fundus Exam      Right Left   Disc Mild Pallor, Sharp rim Mild Pallor, Sharp rim   C/D Ratio 0.2 0.2   Macula Flat, Blunted foveal reflex, RPE mottling and clumping, fine Drusen, No heme or edema Flat, Blunted foveal reflex, RPE mottling and clumping, fine Drusen, mild ERM, No heme or edema   Vessels attenuated, mild AV crossing changes, mild tortuousity attenuated, mild AV crossing changes, mild tortuousity   Periphery Attached, No heme  Attached, No heme         Refraction    Manifest Refraction      Sphere Cylinder Axis Dist VA   Right -1.75 +2.00 075 20/40   Left -2.00 +1.25 105 20/50  Post-dilation          IMAGING AND PROCEDURES  Imaging and Procedures for 06/16/2020  OCT, Retina - OU - Both  Eyes       Right Eye Quality was good. Central Foveal Thickness: 295. Progression has no prior data. Findings include normal foveal contour, no IRF, no SRF, retinal drusen  (Fine drusen, no ORA, no Plaquenil toxicity).   Left Eye Quality was good. Central Foveal Thickness: 317. Progression has no prior data. Findings include normal foveal contour, no IRF, no SRF, epiretinal membrane, retinal drusen  (Mild ERM, Fine drusen, no ORA, no Plaquenil toxicity).   Notes *Images captured and stored on drive  Diagnosis / Impression:  NFP, no IRF/SRF OU Fine drusen, no ORA, no Plaquenil toxicity OU Mild ERM OS  Clinical management:  See below  Abbreviations: NFP - Normal foveal profile. CME - cystoid macular edema. PED - pigment epithelial  detachment. IRF - intraretinal fluid. SRF - subretinal fluid. EZ - ellipsoid zone. ERM - epiretinal membrane. ORA - outer retinal atrophy. ORT - outer retinal tubulation. SRHM - subretinal hyper-reflective material. IRHM - intraretinal hyper-reflective material                 ASSESSMENT/PLAN:    ICD-10-CM   1. Early dry stage nonexudative age-related macular degeneration of both eyes  H35.3131   2. Retinal edema  H35.81 OCT, Retina - OU - Both Eyes  3. Rheumatoid arthritis with positive rheumatoid factor, involving unspecified site ([place])  M05.9   4. Long-term use of Plaquenil  Z79.899   5. Epiretinal membrane (ERM) of left eye  H35.372   6. Essential hypertension  I10   7. Hypertensive retinopathy of both eyes  H35.033   8. Pseudophakia, both eyes  Z96.1    1,2. Age related macular degeneration, non-exudative, both eyes  - early stage w/ fine drusen OU  -The incidence, anatomy, and pathology of dry AMD, risk of progression, and the AREDS and AREDS 2 study including smoking risks discussed with patient.  - Recommend amsler grid monitoring  - f/u 6-9 months, DFE, OCT  3,4. Rheumatoid Arthritis with Plaquenil Use  - no retinal toxicity noted on exam or OCT today  - pt taking 400 mg daily for ~4 years  - pt wt is 92 kg  - 400/92 = 4.35 mg/kg/day  - the AAO recommends daily dosing of < 5.0 mg/kg/day for HCQ  - discussed mechanism of retinal toxicity and association with long-term use (>5 yrs)  - recommend consideration of switching to another agent if possible, in the setting of macular degeneration  - will monitor for development of toxicity if unable to switch medication  5. Epiretinal membrane, OS - The natural history, anatomy, potential for loss of vision, and treatment options including vitrectomy techniques and the complications of endophthalmitis, retinal detachment, vitreous hemorrhage, cataract progression and permanent vision loss discussed with the patient. - mild  ERM - BCVA 20/25 - asymptomatic, no metamorphopsia - no indication for surgery at this time - monitor for now  6,7. Hypertensive retinopathy OU - discussed importance of tight BP control - monitor  8. Pseudophakia OU  - s/p CE/IOL (Dr. Dolores Lory)  - IOL in good position, doing well - monitor  Ophthalmic Meds Ordered this visit:  No orders of the defined types were placed in this encounter.     Return for f/u 6-9 months, non-exu ARMD OU / Plaquenil use.  There are no Patient Instructions on file for this visit.   Explained the diagnoses, plan, and follow up with the patient and they expressed understanding.  Patient expressed understanding of the importance of proper follow up  care.   This document serves as a record of services personally performed by Gardiner Sleeper, MD, PhD. It was created on their behalf by Roselee Nova, COMT. The creation of this record is the provider's dictation and/or activities during the visit.  Electronically signed by: Roselee Nova, COMT 06/16/20 12:20 PM  This document serves as a record of services personally performed by Gardiner Sleeper, MD, PhD. It was created on their behalf by San Jetty. Owens Shark, OA an ophthalmic technician. The creation of this record is the provider's dictation and/or activities during the visit.    Electronically signed by: San Jetty. [place], [place] 11.30.2021 12:20 PM  Gardiner Sleeper, M.D., Ph.D. Diseases & Surgery of the Retina and Vitreous [facility]  I have reviewed the above documentation for accuracy and completeness, and I agree with the above. Gardiner Sleeper, M.D., Ph.D. 06/16/20 12:20 PM  Abbreviations: M myopia (nearsighted); A astigmatism; H hyperopia (farsighted); P presbyopia; Mrx spectacle prescription;  CTL contact lenses; OD right eye; OS left eye; OU both eyes  XT exotropia; ET esotropia; PEK punctate epithelial keratitis; PEE punctate epithelial erosions; DES dry eye syndrome; MGD meibomian  gland dysfunction; ATs artificial tears; PFAT's preservative free artificial tears; [place] nuclear sclerotic cataract; PSC posterior subcapsular cataract; ERM epi-retinal membrane; PVD posterior vitreous detachment; RD retinal detachment; DM diabetes mellitus; DR diabetic retinopathy; NPDR non-proliferative diabetic retinopathy; PDR proliferative diabetic retinopathy; CSME clinically significant macular edema; DME diabetic macular edema; dbh dot blot hemorrhages; CWS cotton wool spot; POAG primary open angle glaucoma; C/D cup-to-disc ratio; HVF humphrey visual field; GVF goldmann visual field; OCT optical coherence tomography; IOP intraocular pressure; BRVO Branch retinal vein occlusion; CRVO central retinal vein occlusion; CRAO central retinal artery occlusion; BRAO branch retinal artery occlusion; RT retinal tear; SB scleral buckle; PPV pars plana vitrectomy; VH Vitreous hemorrhage; PRP panretinal laser photocoagulation; IVK intravitreal kenalog; VMT vitreomacular traction; MH Macular hole;  NVD neovascularization of the disc; NVE neovascularization elsewhere; AREDS age related eye disease study; ARMD age related macular degeneration; POAG primary open angle glaucoma; EBMD epithelial/anterior basement membrane dystrophy; ACIOL anterior chamber intraocular lens; IOL intraocular lens; PCIOL posterior chamber intraocular lens; Phaco/IOL phacoemulsification with intraocular lens placement; Tiro photorefractive keratectomy; LASIK laser assisted in situ keratomileusis; HTN hypertension; DM diabetes mellitus; COPD chronic obstructive pulmonary disease

## 2020-06-16 ENCOUNTER — Other Ambulatory Visit: Payer: Self-pay

## 2020-06-16 ENCOUNTER — Encounter (INDEPENDENT_AMBULATORY_CARE_PROVIDER_SITE_OTHER): Payer: Self-pay | Admitting: Ophthalmology

## 2020-06-16 ENCOUNTER — Ambulatory Visit (INDEPENDENT_AMBULATORY_CARE_PROVIDER_SITE_OTHER): Payer: Medicare Other | Admitting: Ophthalmology

## 2020-06-16 DIAGNOSIS — H353131 Nonexudative age-related macular degeneration, bilateral, early dry stage: Secondary | ICD-10-CM

## 2020-06-16 DIAGNOSIS — I10 Essential (primary) hypertension: Secondary | ICD-10-CM

## 2020-06-16 DIAGNOSIS — H35033 Hypertensive retinopathy, bilateral: Secondary | ICD-10-CM

## 2020-06-16 DIAGNOSIS — H3581 Retinal edema: Secondary | ICD-10-CM | POA: Diagnosis not present

## 2020-06-16 DIAGNOSIS — Z961 Presence of intraocular lens: Secondary | ICD-10-CM

## 2020-06-16 DIAGNOSIS — Z79899 Other long term (current) drug therapy: Secondary | ICD-10-CM | POA: Diagnosis not present

## 2020-06-16 DIAGNOSIS — M059 Rheumatoid arthritis with rheumatoid factor, unspecified: Secondary | ICD-10-CM

## 2020-06-16 DIAGNOSIS — H35372 Puckering of macula, left eye: Secondary | ICD-10-CM

## 2020-06-25 ENCOUNTER — Ambulatory Visit: Payer: Medicare Other | Admitting: Internal Medicine

## 2020-07-14 ENCOUNTER — Other Ambulatory Visit: Payer: Self-pay | Admitting: Internal Medicine

## 2020-07-21 NOTE — Progress Notes (Addendum)
ADVANCED HF CLINIC NOTE  Referring Physician: Dr. Trudie Reed Primary Cardiologist: Dr. Saunders Revel Pulmonary: [name]  HPI:  [name] is a 75 year old female with RA, morbid obesity, interstitial lung disease, bronchiectasis, pulmonary fibrosis, chronic respiratory failure on home O2, diastolic HF  PFTs: 9/67/8938 FEV1 1.34 (73% predicted) FVC 1.62 (66% predicted), ratio 83 DLCO 10.42 (60% predicted).  04/01/20 FEV1 1.35 (73% predicted) FVC 1.60 (65% predicted), ratio 83 DLCO 11.37 (66% predicted).  Hi-res CT 1/20: 1. Severe tracheobronchomalacia, more pronounced on today's scan. 2. Interval progression of basilar predominant fibrotic interstitial lung disease with particularly increased ground-glass component. No honeycombing. 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 3. 3vCAD  HiRes CT 04/15/20: unchanged with moderate pulmonary fibrosis  Echo 4/21: EF 10-17% Normal diastolic function. RV normal. No significant TR Echo 10/17: EF 55-60% RV normal.   Cath 3/18: LAD 50% RCA 20%  RA (mean): 13 mmHg RV (S/EDP): 38/12 mmHg PA (S/D, mean): 43/23 (32) mmHg PCWP (mean): 16 mmHg  Ao sat: 94% PA sat: 70% RA sat: 75%  Fick CO: 6.4 L/min Fick CI: 3.7 L/min/m^2  PVR: 2.5 Wood units  I saw her in consultation on 11/08/19 for evaluation of SOB. Says was feeling a bit better at that time and was about to start Pulmonary Rehab. Stable LE edema managed with lasix 40 daily. It was felt SOB was due mostly yo severe pulmonary fibrosis and obesity. Was 115 pounds when she graduated HS.  Repeat echo ordered.   She is here today with her husband for consideration of RHC for approval for Tyvaso at request of [name]. Overall she feels good. No increasing SOB, CP, dizziness, edema, palpitations or PND/orthopnea. She sees [name] for her pulmonary hypertension. Wears 4L of oxygen continuously, CPAP at night,  and uses Rollator during ambulation. She is now taking lasix 40 mg daily.   ROS:  Please see the history of present illness.   All other systems are personally reviewed and negative.  Past Medical History:  Diagnosis Date  . Acute asthmatic bronchitis   . Allergic rhinitis   . Anxiety   . Arthritis   . Esophageal reflux   . Hypertension   . Interstitial lung disease ([place]) dx jan 2020  . Irritable bowel syndrome   . Oxygen dependent    4 liters day time 6 liters at night  . PONV (postoperative nausea and vomiting)    ponv likes zofran, and scopolamine patch  . Rheumatoid arthritis(714.0)   . Sleep apnea     Current Outpatient Medications  Medication Sig Dispense Refill  . albuterol (VENTOLIN HFA) 108 (90 Base) MCG/ACT inhaler USE 2 INHALATIONS BY MOUTH  EVERY 6 HOURS AS NEEDED 34 g 1  . aspirin EC 81 MG tablet Take 81 mg by mouth daily.    . benzonatate (TESSALON) 200 MG capsule TAKE 1 CAPSULE BY MOUTH 3 TIMES A DAY AS NEEDED FOR COUGH 30 capsule 3  . celecoxib (CELEBREX) 200 MG capsule Take 200 mg by mouth daily.    . cetirizine (ZYRTEC) 10 MG tablet Take 10 mg by mouth daily.    . clidinium-chlordiazePOXIDE (LIBRAX) 5-2.5 MG capsule Take 1 capsule by mouth 2 (two) times daily as needed (for IBS).    . colestipol (COLESTID) 1 g tablet Take 1 g by mouth daily.    Marland Kitchen dicyclomine (BENTYL) 10 MG capsule Take 10 mg by mouth 4 (four) times daily -  before meals  and at bedtime.    Marland Kitchen FLUoxetine (PROZAC) 20 MG capsule Take 20 mg by mouth daily.    . fluticasone (FLONASE) 50 MCG/ACT nasal spray SPRAY 2 SPRAYS INTO EACH NOSTRIL EVERY DAY 48 mL 3  . Fluticasone-Umeclidin-Vilant (TRELEGY ELLIPTA) 100-62.5-25 MCG/INH AEPB Inhale 1 puff into the lungs daily. 3 each 4  . furosemide (LASIX) 40 MG tablet Take 40 mg by mouth daily.    . hydroxychloroquine (PLAQUENIL) 200 MG tablet Take 2 tablets (400 mg total) by mouth daily. Stop for 2 weeks, post-op.    Marland Kitchen ipratropium (ATROVENT) 0.03 % nasal spray  USE 1 TO 2 SPRAYS IN BOTH  NOSTRILS TWICE DAILY 90 mL 3  . ipratropium-albuterol (DUONEB) 0.5-2.5 (3) MG/3ML SOLN USE 1 VIAL IN NEBULIZER EVERY 6 HOURS AS NEEDED *J45.901* npi [id number] 270 mL 11  . leflunomide (ARAVA) 20 MG tablet Take 20 mg by mouth daily.     . methocarbamol (ROBAXIN) 500 MG tablet Take 1 tablet (500 mg total) by mouth every 6 (six) hours as needed for muscle spasms. 40 tablet 0  . mirtazapine (REMERON) 45 MG tablet Take 45 mg by mouth at bedtime.    . montelukast (SINGULAIR) 10 MG tablet TAKE 1 TABLET BY MOUTH AT  BEDTIME 90 tablet 3  . Multiple Vitamin (MULTIVITAMIN) tablet Take 1 tablet by mouth daily.    Marland Kitchen omeprazole (PRILOSEC) 40 MG capsule Take 40 mg by mouth at bedtime.     . OXYGEN Inhale 4 mLs into the lungs continuous.     . Pirfenidone (ESBRIET) 801 MG TABS Take 801 mg by mouth 3 (three) times daily. 90 tablet 11  . potassium chloride SA (KLOR-CON) 20 MEQ tablet Take 1 tablet (20 mEq total) by mouth 2 (two) times daily. 180 tablet 3  . predniSONE (DELTASONE) 5 MG tablet Take 5 mg by mouth daily with breakfast.    . rosuvastatin (CRESTOR) 20 MG tablet Take 1 tablet (20 mg total) by mouth daily. 90 tablet 3  . verapamil (CALAN-SR) 240 MG CR tablet Take 1 tablet (240 mg total) by mouth daily. 90 tablet 3   No current facility-administered medications for this encounter.    Allergies  Allergen Reactions  . Cefdinir Diarrhea  . Erythromycin Base Diarrhea    Other  . Lactose     Diarrhea    . Lactose Intolerance (Gi) Diarrhea  . Lactulose Diarrhea  . Mesalamine Nausea Only  . Nitrofuran Derivatives     shakiness  . Nitrofurantoin Other (See Comments)    shakiness other  . Other Diarrhea and Other (See Comments)    Shaking uncontrollably "lettuce only" "lettuce only"  . Sulfa Antibiotics Other (See Comments)    Patient can't recall reaction   . Tdap [Tetanus-Diphth-Acell Pertussis]     Shaking uncontrollably  . Tetanus Toxoid, Adsorbed Other (See  Comments)    Shaking uncontrollable     Social History   Socioeconomic History  . Marital status: Married    Spouse name: Not on file  . Number of children: 2  . Years of education: Not on file  . Highest education level: Not on file  Occupational History  . Not on file  Tobacco Use  . Smoking status: Never Smoker  . Smokeless tobacco: Never Used  . Tobacco comment: positive passive tobacco smoke exposure  Vaping Use  . Vaping Use: Never used  Substance and Sexual Activity  . Alcohol use: Yes    Comment: glass of wine each night   .  Drug use: No  . Sexual activity: Yes  Other Topics Concern  . Not on file  Social History Narrative   Exercise - at least 2 times weekly   Caffeine - 2 cups in the morning         Social Determinants of Health   Financial Resource Strain: Not on file  Food Insecurity: Not on file  Transportation Needs: Not on file  Physical Activity: Not on file  Stress: Not on file  Social Connections: Not on file  Intimate Partner Violence: Not on file   Family History  Problem Relation Age of Onset  . Heart disease Mother   . Arthritis Mother   . Heart attack Father   . Diabetes Other        sibling  . Heart attack Other        sibling    Vitals:   07/22/20 0915  BP: 132/78  Pulse: 93  SpO2: 97%  Weight: 87.1 kg (192 lb)   Wt Readings from Last 3 Encounters:  07/22/20 87.1 kg (192 lb)  05/13/20 92 kg (202 lb 12.8 oz)  04/02/20 94.2 kg (207 lb 9.6 oz)   PHYSICAL EXAM: General:  Obese, well appearing WF, on oxygen. No resp difficulty HEENT: normal Neck: supple, thick neck, no JVD. Carotids 2+ bilat; no bruits. No lymphadenopathy or thryomegaly appreciated. Cor: PMI nondisplaced. Regular rate & rhythm. No rubs, gallops or murmurs. Lungs: clear, RLL fine crackle Abdomen: Obese, soft, nontender, nondistended. No hepatosplenomegaly. No bruits or masses. Good bowel sounds. Extremities: no cyanosis, clubbing, rash, edema Neuro: alert &  oriented x 3, cranial nerves grossly intact. moves all 4 extremities w/o difficulty. Affect pleasant  ECG: SR 85 bpm, (personally reviewed).  ASSESSMENT & PLAN:  1. Dyspnea/Pulmonary HTN - Clearly multifactorial - I have reviewed chest CT, PFTs, recent cath and previous echo - Suspect major drivers are severe pulmonary fibrosis and obesity.  - Echo 4/21 EF 21-03 Normal diastolic function. Normal RV. No TR. (personally reviewed) - she is somewhat improved with recent weight loss but still quite limited. She has had f/u with [name] and is being considered for inhaled Tyvaso for her ILD-related PAH. She will need repeat RHC. We discussed the procedure in detail and will proceed - BMET today  2. Pulmonary fibrosis in setting of RA - Followed by [name]. Plan as above  3. CAD, non obstructive - Continue to follow with Dr. Saunders Revel.  4. Obesity - Congratulated on recent weight loss - Encouraged further efforts - Body mass index is 37.5 kg/m.  5. Chronic hypoxic respiratory failure  - Continue O2 - Plan as above    Glori Bickers, MD  07/22/20 9:17 AM

## 2020-07-22 ENCOUNTER — Encounter (HOSPITAL_COMMUNITY): Payer: Self-pay | Admitting: Internal Medicine

## 2020-07-22 ENCOUNTER — Ambulatory Visit (HOSPITAL_COMMUNITY)
Admission: RE | Admit: 2020-07-22 | Discharge: 2020-07-22 | Disposition: A | Discharge status: Home / Self Care | Payer: Medicare Other | Source: Ambulatory Visit | Attending: Internal Medicine | Admitting: Internal Medicine

## 2020-07-22 ENCOUNTER — Other Ambulatory Visit: Payer: Self-pay

## 2020-07-22 VITALS — BP 132/78 | HR 93 | Wt 192.0 lb

## 2020-07-22 DIAGNOSIS — K219 Gastro-esophageal reflux disease without esophagitis: Secondary | ICD-10-CM | POA: Insufficient documentation

## 2020-07-22 DIAGNOSIS — M069 Rheumatoid arthritis, unspecified: Secondary | ICD-10-CM | POA: Diagnosis not present

## 2020-07-22 DIAGNOSIS — Z881 Allergy status to other antibiotic agents status: Secondary | ICD-10-CM | POA: Insufficient documentation

## 2020-07-22 DIAGNOSIS — G4730 Sleep apnea, unspecified: Secondary | ICD-10-CM | POA: Insufficient documentation

## 2020-07-22 DIAGNOSIS — J8410 Pulmonary fibrosis, unspecified: Secondary | ICD-10-CM | POA: Insufficient documentation

## 2020-07-22 DIAGNOSIS — Z6837 Body mass index (BMI) 37.0-37.9, adult: Secondary | ICD-10-CM | POA: Diagnosis not present

## 2020-07-22 DIAGNOSIS — Z791 Long term (current) use of non-steroidal anti-inflammatories (NSAID): Secondary | ICD-10-CM | POA: Diagnosis not present

## 2020-07-22 DIAGNOSIS — Z79899 Other long term (current) drug therapy: Secondary | ICD-10-CM | POA: Insufficient documentation

## 2020-07-22 DIAGNOSIS — J9611 Chronic respiratory failure with hypoxia: Secondary | ICD-10-CM

## 2020-07-22 DIAGNOSIS — Z9981 Dependence on supplemental oxygen: Secondary | ICD-10-CM | POA: Insufficient documentation

## 2020-07-22 DIAGNOSIS — E739 Lactose intolerance, unspecified: Secondary | ICD-10-CM | POA: Insufficient documentation

## 2020-07-22 DIAGNOSIS — Z7952 Long term (current) use of systemic steroids: Secondary | ICD-10-CM | POA: Diagnosis not present

## 2020-07-22 DIAGNOSIS — J849 Interstitial pulmonary disease, unspecified: Secondary | ICD-10-CM | POA: Diagnosis not present

## 2020-07-22 DIAGNOSIS — Z8261 Family history of arthritis: Secondary | ICD-10-CM | POA: Diagnosis not present

## 2020-07-22 DIAGNOSIS — Z888 Allergy status to other drugs, medicaments and biological substances status: Secondary | ICD-10-CM | POA: Diagnosis not present

## 2020-07-22 DIAGNOSIS — Z833 Family history of diabetes mellitus: Secondary | ICD-10-CM | POA: Insufficient documentation

## 2020-07-22 DIAGNOSIS — I2510 Atherosclerotic heart disease of native coronary artery without angina pectoris: Secondary | ICD-10-CM | POA: Diagnosis not present

## 2020-07-22 DIAGNOSIS — Z8249 Family history of ischemic heart disease and other diseases of the circulatory system: Secondary | ICD-10-CM | POA: Insufficient documentation

## 2020-07-22 DIAGNOSIS — I272 Pulmonary hypertension, unspecified: Secondary | ICD-10-CM | POA: Diagnosis present

## 2020-07-22 DIAGNOSIS — Z7982 Long term (current) use of aspirin: Secondary | ICD-10-CM | POA: Insufficient documentation

## 2020-07-22 DIAGNOSIS — Z882 Allergy status to sulfonamides status: Secondary | ICD-10-CM | POA: Insufficient documentation

## 2020-07-22 DIAGNOSIS — Z887 Allergy status to serum and vaccine status: Secondary | ICD-10-CM | POA: Diagnosis not present

## 2020-07-22 LAB — CBC
HCT: 38.3 % (ref 36.0–46.0)
Hemoglobin: 12.4 g/dL (ref 12.0–15.0)
MCH: 31.8 pg (ref 26.0–34.0)
MCHC: 32.4 g/dL (ref 30.0–36.0)
MCV: 98.2 fL (ref 80.0–100.0)
Platelets: 274 10*3/uL (ref 150–400)
RBC: 3.9 MIL/uL (ref 3.87–5.11)
RDW: 15.1 % (ref 11.5–15.5)
WBC: 7.8 10*3/uL (ref 4.0–10.5)
nRBC: 0 % (ref 0.0–0.2)

## 2020-07-22 LAB — BASIC METABOLIC PANEL
Anion gap: 6 (ref 5–15)
BUN: 17 mg/dL (ref 8–23)
CO2: 29 mmol/L (ref 22–32)
Calcium: 10.4 mg/dL — ABNORMAL HIGH (ref 8.9–10.3)
Chloride: 105 mmol/L (ref 98–111)
Creatinine, Ser: 0.8 mg/dL (ref 0.44–1.00)
GFR, Estimated: >60 mL/min (ref >60)
Glucose, Bld: 91 mg/dL (ref 70–99)
Potassium: 4.1 mmol/L (ref 3.5–5.1)
Sodium: 140 mmol/L (ref 135–145)

## 2020-07-22 NOTE — Patient Instructions (Signed)
Labs done today, your results will be available in MyChart, we will contact you for abnormal readings.   Heart Catheterization, see instructions below  Please call our office in May 2022 to schedule your follow up appointment  If you have any questions or concerns before your next appointment please send [place] a message through [place] or call our office at [phone].    TO LEAVE A MESSAGE FOR THE NURSE SELECT OPTION 2, PLEASE LEAVE A MESSAGE INCLUDING: . YOUR NAME . DATE OF BIRTH . CALL BACK NUMBER . REASON FOR CALL**this is important as we prioritize the call backs  [place] AS LONG AS YOU CALL BEFORE 4:00 PM  At the [facility], you and your health needs are our priority. As part of our continuing mission to provide you with exceptional heart care, we have created designated Provider Care Teams. These Care Teams include your primary Cardiologist (physician) and Advanced Practice Providers (APPs- Physician Assistants and Nurse Practitioners) who all work together to provide you with the care you need, when you need it.   You may see any of the following providers on your designated Care Team at your next follow up: Marland Kitchen Dr Glori Bickers . Dr Loralie Champagne . Darrick Grinder, NP . Lyda Jester, PA . Audry Riles, PharmD   Please be sure to bring in all your medications bottles to every appointment.      HEARTH CATHETERIZATION INSTRUCTIONS:   You are scheduled for a Cardiac Catheterization on Friday, January 14 with Dr. Glori Bickers.  1. Please arrive at the [facility] (Main Entrance A) at [facility]: [address] [place], [place] [address] at 8:30 AM (This time is two hours before your procedure to ensure your preparation). Free valet parking service is available.   Special note: Every effort is made to have your procedure done on time. Please understand that emergencies sometimes delay scheduled procedures.  2.  Diet: Do not eat solid foods after midnight.  The patient may have clear liquids until 5am upon the day of the procedure.  3. COVID TEST: Wed 07/29/20 at 12 pm at [facility]   4. Medication instructions in preparation for your procedure:   Friday 1/14 AM DO NOT TAKE FUROSEMIDE  On the morning of your procedure, take your Aspirin and any morning medicines NOT listed above.  You may use sips of water.  5. Plan for one night stay--bring personal belongings. 6. Bring a current list of your medications and current insurance cards. 7. You MUST have a responsible person to drive you home. 8. Someone MUST be with you the first 24 hours after you arrive home or your discharge will be delayed. 9. Please wear clothes that are easy to get on and off and wear slip-on shoes.  Thank you for allowing [place] to care for you!   -- [facility]

## 2020-07-22 NOTE — Addendum Note (Signed)
Encounter addended by: Jolaine Artist, MD on: 07/22/2020 11:29 AM  Actions taken: Clinical Note Signed

## 2020-07-23 ENCOUNTER — Other Ambulatory Visit (HOSPITAL_COMMUNITY): Payer: Self-pay | Admitting: *Deleted

## 2020-07-23 ENCOUNTER — Telehealth (HOSPITAL_COMMUNITY): Payer: Self-pay

## 2020-07-23 DIAGNOSIS — I272 Pulmonary hypertension, unspecified: Secondary | ICD-10-CM

## 2020-07-23 NOTE — Telephone Encounter (Signed)
Procedure: Heart Catheterization-right side Date of service: 5/36/6440 No precert required.

## 2020-07-28 ENCOUNTER — Telehealth (HOSPITAL_COMMUNITY): Payer: Self-pay | Admitting: *Deleted

## 2020-07-28 NOTE — Telephone Encounter (Signed)
Pt left VM stating she is scheduled for a heart cath Friday but today she has a cough and was prescribed antibiotics and prednisone. Pt is scheduled for pre procedure covid test tomorrow. Pt wants to know if she should postpone the cath until she feels better.  Routed to [place]

## 2020-07-29 ENCOUNTER — Ambulatory Visit: Payer: Medicare Other | Admitting: Internal Medicine

## 2020-07-29 ENCOUNTER — Other Ambulatory Visit: Admission: RE | Admit: 2020-07-29 | Payer: Medicare Other | Source: Ambulatory Visit

## 2020-07-29 ENCOUNTER — Telehealth (HOSPITAL_COMMUNITY): Payer: Self-pay

## 2020-07-29 NOTE — Telephone Encounter (Signed)
Patient called to report that she has is currently sick and she needs to reschedule her heart cath for a later date. Patient will call once she feels better to reschedule.

## 2020-07-29 NOTE — Telephone Encounter (Signed)
Sure. Let's reschedule

## 2020-07-31 ENCOUNTER — Encounter (HOSPITAL_COMMUNITY): Admission: RE | Payer: Self-pay | Source: Home / Self Care

## 2020-07-31 ENCOUNTER — Ambulatory Visit (HOSPITAL_COMMUNITY): Admission: RE | Admit: 2020-07-31 | Payer: Medicare Other | Source: Home / Self Care | Admitting: Internal Medicine

## 2020-07-31 SURGERY — RIGHT HEART CATH AND CORONARY ANGIOGRAPHY
Anesthesia: LOCAL

## 2020-08-19 ENCOUNTER — Other Ambulatory Visit: Payer: Self-pay | Admitting: Internal Medicine

## 2020-08-19 ENCOUNTER — Encounter: Payer: Self-pay | Admitting: Internal Medicine

## 2020-08-19 ENCOUNTER — Ambulatory Visit (INDEPENDENT_AMBULATORY_CARE_PROVIDER_SITE_OTHER): Payer: Medicare Other | Admitting: Internal Medicine

## 2020-08-19 ENCOUNTER — Other Ambulatory Visit: Payer: Self-pay

## 2020-08-19 ENCOUNTER — Other Ambulatory Visit (INDEPENDENT_AMBULATORY_CARE_PROVIDER_SITE_OTHER): Payer: Medicare Other

## 2020-08-19 VITALS — BP 124/74 | HR 100 | Temp 97.8°F | Ht 61.0 in | Wt 196.4 lb

## 2020-08-19 DIAGNOSIS — J849 Interstitial pulmonary disease, unspecified: Secondary | ICD-10-CM | POA: Diagnosis not present

## 2020-08-19 LAB — CBC WITH DIFFERENTIAL/PLATELET
Basophils Absolute: 0 10*3/uL (ref 0.0–0.1)
Basophils Relative: 0.3 % (ref 0.0–3.0)
Eosinophils Absolute: 0.1 10*3/uL (ref 0.0–0.7)
Eosinophils Relative: 0.7 % (ref 0.0–5.0)
HCT: 38.6 % (ref 36.0–46.0)
Hemoglobin: 12.6 g/dL (ref 12.0–15.0)
Lymphocytes Relative: 7.9 % — ABNORMAL LOW (ref 12.0–46.0)
Lymphs Abs: 0.9 10*3/uL (ref 0.7–4.0)
MCHC: 32.7 g/dL (ref 30.0–36.0)
MCV: 95.4 fl (ref 78.0–100.0)
Monocytes Absolute: 0.9 10*3/uL (ref 0.1–1.0)
Monocytes Relative: 7.8 % (ref 3.0–12.0)
Neutro Abs: 10 10*3/uL — ABNORMAL HIGH (ref 1.4–7.7)
Neutrophils Relative %: 83.3 % — ABNORMAL HIGH (ref 43.0–77.0)
Platelets: 289 10*3/uL (ref 150.0–400.0)
RBC: 4.05 Mil/uL (ref 3.87–5.11)
RDW: 15.5 % (ref 11.5–15.5)
WBC: 12 10*3/uL — ABNORMAL HIGH (ref 4.0–10.5)

## 2020-08-19 LAB — HEPATIC FUNCTION PANEL
ALT: 31 U/L (ref 0–35)
AST: 22 U/L (ref 0–37)
Albumin: 3.8 g/dL (ref 3.5–5.2)
Alkaline Phosphatase: 76 U/L (ref 39–117)
Bilirubin, Direct: 0.1 mg/dL (ref 0.0–0.3)
Total Bilirubin: 0.4 mg/dL (ref 0.2–1.2)
Total Protein: 6.4 g/dL (ref 6.0–8.3)

## 2020-08-19 LAB — BASIC METABOLIC PANEL
BUN: 20 mg/dL (ref 6–23)
CO2: 31 mEq/L (ref 19–32)
Calcium: 10.8 mg/dL — ABNORMAL HIGH (ref 8.4–10.5)
Chloride: 102 mEq/L (ref 96–112)
Creatinine, Ser: 0.77 mg/dL (ref 0.40–1.20)
GFR: 74.97 mL/min (ref >60.00)
Glucose, Bld: 77 mg/dL (ref 70–99)
Potassium: 4.4 mEq/L (ref 3.5–5.1)
Sodium: 139 mEq/L (ref 135–145)

## 2020-08-19 MED ORDER — PROMETHAZINE-CODEINE 6.25-10 MG/5ML PO SYRP: 5.0000 mL | ORAL_SOLUTION | Freq: Four times a day (QID) | ORAL | 0 refills | Status: DC | PRN

## 2020-08-19 MED ORDER — PREDNISONE 10 MG (21) PO TBPK
ORAL_TABLET | Freq: Every day | ORAL | 0 refills | Status: DC
Start: 2020-08-19 — End: 2020-09-02

## 2020-08-19 NOTE — Progress Notes (Signed)
05/21/2018- 75 year old female never smoker followed for allergic rhinitis, rhinosinusitis, asthmatic bronchitis, ILD, OSA,complicated by GERD/Crohn's, Rheumatoid arthritis/prednisone -----4 month follow up for asthma and OSA. Per patient she is still having issues with SOB. She has had 2 foot surgeries since her last visit. States that the [name] is working but [name] wanted to switch her to something else. Unsure of the medication name and I did not see it in his last OV.  Singulair, and Anoro, neb duoneb CPAP auto 5-15/Advanced   Download 100% compliance AHI 1.3/hour. I discussed FENO and she will try Symbicort. Discussed inhaled steroids.  We need to get her a new AeroChamber. Has been on sustained antibiotics for failed appliance in her foot. Notices some runny nose and scant bland mucus.  Wheezing has been well controlled.  Little cough.  She and her husband recognize inability to get any exercise as a substantial reason for dyspnea.  No acute events.  Cardiology continues to follow, including her pulmonary hypertension.  We reviewed her most recent chest CT and discussed implications of progressive interstitial fibrosis.  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  I would like to refer her to [name] for his opinion and advice. FENO 03/15/18  49 H CT chest Hi Res 02/02/2018- IMPRESSION: 1. Spectrum of findings compatible with fibrotic interstitial lung disease with a slight basilar predominance, with mild progression since 2017. 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 2. Mild patchy air trapping, unchanged, indicative of small airways disease. 3. Left main and two-vessel coronary atherosclerosis. 4. Stable dilated main pulmonary artery, suggesting chronic pulmonary arterial hypertension. Aortic Atherosclerosis (ICD10-I70.0).  06/08/18-  75 year old female never smoker followed for allergic rhinitis, rhinosinusitis, asthmatic bronchitis, ILD, OSA,complicated by GERD/Crohn's, Rheumatoid arthritis/prednisone -----coughing for 2 weeks, yellow, green mucus, SOB  Singulair, Atrovent nasal spray, Symbicort 160, and Anoro, neb albuterol, neb DuoNeb, Complains of increased cough with chest congestion, green/yellow sputum, low-grade fever 99 degrees over the last 2 weeks.  Not on prednisone now.  Last antibiotic in October. Pending ILD consultation with [name] in December.   07/02/2018  - Visit   75 year old female patient presenting today for acute visit.  Patient was recently treated with a course of Augmentin and reports sputum color is now light green but has decreased in sputum amount since last office visit.  Patient does not feel she is getting better.  Patient remains adherent to Anoro Ellipta.  On arrival to our office today patient was short of breath and oxygen saturations were 81%.  With deep breathing patient was able to increase oxygenation to 85%.  When placed on 2 L O2 patient was able to maintain at 91%.  Patient had previously been on oxygen in 2015 but this was later [facility].  Patient's weight has also increased over the previous office visits, patient remains adherent to diuretic treatment plan as on taking 1 diuretic daily.  Patient reports she is allowed to take an additional diuretic when she feels necessary.  Unfortunately patient has not been weighing herself regularly there is no set plan to when she should take her second diuretic.  Patient is exceptionally sedentary at home and rarely gets out of her bed or lazy boy chair which is about 3 feet from her bed.  Patient's husband takes care of patient completely including getting drinks and food so patient does not have to leave her lazy boy  recliner.  Patient watches many television shows and does not report much activity.   Patient has completed follow-up with  rheumatology last week and no changes to her medications were made.  Patient reports that she was stable at the time.  CPAP compliance report showing 30 out of 30 days used.  All 30 those days greater than 4 hours.  APAP settings 5-15.  Average usage days 7 hours and 56 minutes.  AHI 0.8.    OV 07/31/2018 -referred to interstitial lung disease clinic  Subjective:  [name], female , DOB: 11/24/1945 , age 45 y.o. , MRN: [id number] , ADDRESS: [address] [place] [address]   07/31/2018 -   Chief Complaint  Patient presents with  . Follow-up    Pt of [name] that stated to be referred to ILD clinic.  Pt currently has complaints of wheezing which she has had since December 2019, worsening SOB when moving around and also states she has a pain in left side of chest near shoulder. Pt wears between 2-4L O2 but mainly has been wearing 4L at home.      HPI [name] 75 y.o. -accompanied by her husband.  Referred by [name] pulmonologist in our practice for evaluation of interstitial lung disease in the setting of rheumatoid arthritis.  History is gained from her, talking to her husband, review of recent office visits and also the interstitial lung disease questionnaire.  As best as I can gather   [place] ILD Questionnaire  Symptoms: She is known to have ILD on a CT chest in 2017 but she is not aware of it.  She says that she follows normally for sleep apnea and asthma with [name].  She recollects that approximately 11 or 12 years ago she was hospitalized for pneumonia and was on oxygen for short while not otherwise specified.  Then came off oxygen.  After that overall she is been stable and sedentary using her CPAP.  She is mostly sedentary because of obesity, rheumatoid arthritis and also because of the nonhealing ulcer in her feet for which she is been advised sedentary lifestyle to enable healing.  Most recently to  Thanksgiving 2019 she was seen for respiratory exacerbation and given antibiotics and prednisone.  She followed up mid December 2019 and was found to have new onset hypoxemia 85% on room air.  She was then discharged on portable oxygen which she is using 4 L nasal cannula at rest.  She tells me that at home when she comes off of CPAP she is in her 82s on room air at rest.  In fact in the office today she is 83% on room air at rest but 93% on 2 L nasal cannula at rest.  There is concerned that she has worsening ILD particularly summer 2019 on her CT chest had progressive findings compared to 2 years earlier.  The presumption is that this ILD is caused by rheumatoid arthritis.  In the 2019 CT chest CT is reported as probable UIP but in my personal visualization is either indeterminate or alternative diagnosis.  In terms of symptoms: She reports insidious onset of shortness of breath for some years and gradually getting worse.  Level 2 dyspnea at rest.  Level for dyspnea taking a shower.  Level 5 dyspnea walking at her own pace of walking with others of her age and walking up stairs or walking up a hill.  She does have  a cough with yellow and green sputum.  She does cough at night.  There is also wheezing  Past Medical History : She has longstanding history of rheumatoid arthritis since the 1980s.  Used to be followed by Dr. Danie Binder.  As best as I can gather she used to be on methotrexate maybe 10 or 20 years ago.  She took it for a short while and then stopped it because of diarrhea.  She took this for less than 1 year.  She believes that after that she was basically on pain management.  She is only been on prednisone chronic intake for a total of 1 year approximately 4 to 5 years ago.  Around this time Dr. Hoyt Koch retired and she started seeing Dr. Gavin Pound.  Since then she is been on Arava and Plaquenil.  She has never seen any other immunomodulators in the setting of rheumatoid arthritis  according to history.  Overall the rheumatoid arthritis has caused some disability with deformed joints in her hands and also her using cane.    She is not on any anticoagulation is never had any blood clots.  In 2017 she had vascular extremity duplex venous that was negative for DVT.  Most recently she is been dealing with nonhealing ulcer of the left toe/metatarsal .  She had August and September 2018.  And then 1 in the latter part of 2019.  Most recently she says she has had a duplex lower extremity venous.  I do not have access to these results.  She is waiting on this.  Was done in the last few to several days.   She also has a longstanding history of asthma for which she is on Singulair.  Exam nitric oxide was elevated in August 2019 at 49 ppb but 07/31/2018 I snormal in the 20s though she is wheezing   She has nonobstructive coronary artery disease and in March 2018 had a right heart catheterization with elevated pulmonary pressures but also slightly elevated wedge pressure [see below].  She is started on Lasix  She has sleep apnea for which she uses CPAP.  She has obesity  ROS: Positive for arthralgia and arthritis.  Dry eyes and dry mouth but otherwise negative   FAMILY HISTORY of LUNG DISEASE: Denies   EXPOSURE HISTORY: Never smoked any cigarettes, marijuana, vaping, cocaine or intravenous drug use   HOME and HOBBY DETAILS : [place] lived there for 8 years.  No mold.  No dampness.  Does use humidifier and does use CPAP but there is no mold in it.  Also uses nebulizer machine but there is no mold.  No pet birds no musical instruments no guarding habits   OCCUPATIONAL HISTORY (122 questions) : Denies   PULMONARY TOXICITY HISTORY (27 items): She has taken nitrofurantoin 9 years ago.  Has not taken methotrexate for a year 5 years ago and is taken sulfasalazine       Results for [name], [name] (MRN [id number]) as of 07/31/2018 16:31  Ref. Range  09/03/2015 12:45 02/21/2017  FVC-Pre Latest Units: L 2.18 1.7L  FVC-%Pred-Pre Latest Units: % 84 66%   Results for [name], [name] (MRN [id number]) as of 07/31/2018 16:31  Ref. Range 09/03/2015 12:45  DLCO cor Latest Units: ml/min/mmHg 14.06  DLCO cor % pred Latest Units: % 69   HEART CATH MARCH 2018 Right Heart Pressures RA (mean): 13 mmHg RV (S/EDP): 38/12 mmHg PA (S/D, mean): 43/23 (32) mmHg PCWP (mean): 16 mmHg  Conclusions: 1. Mild to moderate, non-obstructive coronary artery disease with 50% mid LAD and 20% proximal RCA stenoses. 2. Mild to moderate pulmonary hypertension with mildly elevated right heart filling pressures. 3. Upper normal left heart filling pressures. 4. Normal Fick cardiac output/index. 5. Equalization of end-diastolic pressures with ventricular concordance. This can be seen in the setting of restrictive process.  Recommendations: 1. Escalate statin therapy to prevent progression of CAD. 2. Increase furosemide to 40 mg BID and KCl to 40 mg BID. Patient to have BMP in 1 week to evaluate renal function and potassium. 3. Continue outpatient pulmonary follow-up. I suspect underlying lung disease is the driving force behind the patient's dyspnea, pulmonary hypertension, and elevated right heart pressures. 4. Outpatient follow-up in cardiology clinic in ~6 weeks.  Nelva Bush, MD   CT chest high res July 2019 - visualized personally - personaly opinion - indeterminate or alt dx for UIP but agree with progression  IMPRESSION: Lungs/Pleura: No pneumothorax. No pleural effusion. No acute consolidative airspace disease or lung masses. Few scattered small solid pulmonary nodules in mid to upper right lung, largest 4 mm in the right upper lobe (series 3/image 46), unchanged since 03/14/2016 CT, considered benign. No new significant pulmonary nodules. Mild patchy air trapping in both lungs on the expiration sequence, unchanged. Patchy peribronchovascular and  subpleural reticulation and ground-glass attenuation throughout both lungs with associated mild traction bronchiectasis and mild architectural distortion. There is a slight basilar gradient to these findings. Tiny focus of honeycombing in anterior right upper lobe (series 8/image 59). These findings have mildly worsened since 03/14/2016 chest CT. 1. Spectrum of findings compatible with fibrotic interstitial lung disease with a slight basilar predominance, with mild progression since 2017. 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 2. Mild patchy air trapping, unchanged, indicative of small airways disease. 3. Left main and two-vessel coronary atherosclerosis. 4. Stable dilated main pulmonary artery, suggesting chronic pulmonary arterial hypertension.  Aortic Atherosclerosis (ICD10-I70.0).   Electronically Signed   By: Ilona Sorrel M.D.   On: 02/02/2018 14:03        OV 03/15/2019  Subjective:  [name], female , DOB: November 12, 1945 , age 75 y.o. , MRN: [id number] , ADDRESS: [address] [place] [address]   03/15/2019 -   Chief Complaint  Patient presents with  . IPD (Interstitial pulmonary disease)    Breathing is doing better since being on Esbriet. Has productive cough with green phelgm. Has been on antibiotic for last 6 weeks. Had hip replacement in June.     HPI TUYEN UNCAPHER 75 y.o. 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  Started on Esbriet in March 2020.  Has progressive phenotype.   She presents for routine follow-up with her husband.  She tells me that ever since starting Esbriet in the spring 2020 she has been doing well.  She does not feel that the disease has progressed.  In fact she feels somewhat better.  She continues to use 4 L of oxygen.  She is now taking the Esbriet at 1 pill  3 times daily.  This is the full dose of 1 pill.  Her last pulmonary function test was in early 2020.  It has been a while since she did her liver function test and she requires a repeat of that today.  She is willing to have her flu shot today.  She is  asking for prescription for emergency electric generator.  She does not know about the [employer] energy assistance program.  She has a handicap sticker.  She is interested in the ILD-PRO research registry study.    She is on Trelegy inhaler and Singulair I am not fully clear why   In June 2020 she had hip replacement for stress fracture and was on 6 weeks of Keflex.  She is slowly improving.  She uses a walker.  OV 05/17/2019  Subjective:  [name], female , DOB: [birth year] , age 84 y.o. , MRN: [id number] , ADDRESS: [address] [place] [address]   05/17/2019 -   Chief Complaint  Patient presents with  . Follow-up    Patient reports that she has had a productive cough with yellow sputum and her PCP placed her on an antibiotic.    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  Started on Esbriet in March 2020.  Has progressive phenotype. COVID negative 04/25/2019,. On 02 4L [place] since end 2019  Also hx of asthma  Also hx of chronic sinusitis - 2017 CT maxillary sinusits - DR Wilburn Cornelia  History of sleep apnea on CPAP  HPI [name] 75 y.o. -presents to the ILD clinic for the above issues.  She continues on pirfenidone since March 2020.  She says this is helping her.  No adverse effects.  Last liver function test was August 2020.  She needs another liver function test right now.  No nausea vomiting or diarrhea or weight loss.  She continues on 4 L of oxygen continuously 24/7.  At night she uses CPAP for her sleep apnea.  She and her husband think that she needs 6 L of oxygen at night and they are trying to increase the oxygen.  Although this is not based on data but on  subjective reasoning.    New issue associated with sinus issues: Her main issue today is that for the last 3-week she has had worsening of her cough and there is associated yellow sputum.  Her COVID-19 test was -2 days ago.  This been going on for 3 weeks.  She tells me this happens to her every winter.  She is complaining of chronic postnasal drainage.  In 2017 she had a CT scan of the sinus that showed maxillary sinusitis and saw Dr. Wilburn Cornelia but since then has not followed up.  She does not have a history of sinus surgery.  She is frustrated by the cough and the yellow sputum.  Her pulmonary function test itself is stable since she started the [place].  She is interested in the ILD pro registry study.  In terms of her asthma: She is on Trelegy and Singulair.  She does not want to stop this.  There is a clinical history.  She says that if she stops this it gets worse.   In terms of her sleep apnea: She continues on CPAP with oxygen at night.    ROS    SYMPTOM SCALE - ILD 03/15/2019  05/17/2019   O2 use x   Shortness of Breath 0 -> 5 scale with 5 being worst (score 6 If unable to do)   At rest 0 0  Simple tasks - showers, clothes change, eating, shaving 0 0  Household (dishes, doing bed, laundry) 5 6  Shopping 5 6  Walking level at own pace 3 2  Walking keeping up with others of same age 28  3  Walking up Stairs 5 3  Walking up Hill 5 6  Total (40 - 48) Dyspnea Score 25 26  How bad is your cough? 2 3  How bad is your fatigue 5 5    Simple office walk 185 feet x  3 laps goal with forehead probe 03/15/2019 - uses 4L [place] at home   O2 used Room air  Number laps completed 1/2 lap ad desat to 88%  Comments about pace   Resting Pulse Ox/HR   Final Pulse Ox/HR   Desaturated </= 88%   Desaturated <= 3% points   Got Tachycardic >/= 90/min   Symptoms at end of test   Miscellaneous comments -walked with 4L [place] - uses 4L [place] at home     Results for MYRENE, BOUGHER (MRN [id number]) as of  05/17/2019 10:56  Ref. Range 09/03/2015 12:45 10/01/2018 14:45 05/17/2019 08:59  FVC-Pre Latest Units: L 2.18 1.81 1.82  FVC-%Pred-Pre Latest Units: % 84 72 74  Results for DEBHORA, TITUS (MRN [id number]) as of 05/17/2019 10:56  Ref. Range 09/03/2015 12:45 10/01/2018 14:45 05/17/2019 08:59  DLCO unc Latest Units: ml/min/mmHg 14.82 11.94 11.28  DLCO unc % pred Latest Units: % 73 69 65    IMPRESSION: CT chest Jan  2020 1. Severe tracheobronchomalacia, more pronounced on today's scan. 2. Interval progression of basilar predominant fibrotic interstitial lung disease with particularly increased ground-glass component. No honeycombing. 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 Findings are consistent with UIP per consensus guidelines: Diagnosis of Idiopathic Pulmonary Fibrosis: An Official ATS/ERS/JRS/ALAT Clinical Practice Guideline. [place], Iss 5, [phone], Mar 18 2017. 3. Stable dilated main pulmonary artery, suggesting chronic pulmonary arterial hypertension. 4. Left main and 3 vessel coronary atherosclerosis.  Aortic Atherosclerosis (ICD10-I70.0).   Electronically Signed   By: Ilona Sorrel M.D.   On: 08/20/2018 08:21   - per HPI  OV 04/02/2020  Subjective:  [name], female , DOB: [birth year] , age 87 y.o. 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  Started on Esbriet in March 2020.  Has progressive phenotype. COVID negative 04/25/2019,. On 02 4L [place] since end 2019  - on ild pro registry  Also hx of asthma -Singulair and inhaler  Also hx of chronic sinusitis - 2017 CT maxillary sinusits - DR Wilburn Cornelia  History of sleep apnea on CPAP  Rheumatoid arthritis seen by Dr. Gavin Pound on leflunomide, Plaquenil and daily prednisone 5  mg/day.  04/02/2020 -   Chief Complaint  Patient presents with  . Follow-up    pt is here to go over pft results     HPI [name] 75 y.o. -presents for the above issues of interstitial lung disease in the setting of connective tissue disease.  She is on leflunomide and Plaquenil and prednisone with her rheumatologist.  She is on pirfenidone for her ILD.  I personally last saw her almost a year ago in October 2020.  After that she is seen nurse practitioner.  In the interim she says she is overall stable with the symptom score show a different story she has had significant worsening of her symptoms.  She continues to be morbidly obese.  Walking desaturation test she is desaturating to the point she is requiring 6 L to correct.  In fact  early in August 2020 [place] and was extremely short of breath.  Even at rest on 3 L she was 89%.  She continues to use a CPAP.  Her husband who is here with her today would like her to lose weight in order to help her health status.  Pulmonary function test shows also a decline.  Her last CT scan of the chest was in June 2020    [place] 05/13/2020   Subjective:  [name], female , DOB: March 19, 1946, age 72 y.o. years. 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    - Started on Esbriet in March 2020.  Has progressive phenotype. COVID negative 04/25/2019,. On 02 4L [place] since end 2019  - on ild pro registry  Also hx of asthma -Singulair and inhaler  Also hx of chronic sinusitis - 2017 CT maxillary  sinusits - DR Wilburn Cornelia  History of sleep apnea on CPAP  Rheumatoid arthritis seen by Dr. Gavin Pound on leflunomide, Plaquenil and daily prednisone 5 mg/day.  Elevated pulmonary artery pressures but PVR less than 3 in 2018: Right Heart Pressures RA (mean): 13 mmHg RV (S/EDP): 38/12 mmHg PA (S/D, mean): 43/23 (32) mmHg PCWP (mean): 16 mmHg  Ao sat: 94% PA sat: 70% RA sat: 75%  Fick CO: 6.4 L/min Fick CI: 3.7 L/min/m^2  PVR: 2.5 [place] units     Chief Complaint  Patient presents with  . Follow-up    more sob walking shorter distances.  coughing up yellow phlegm.       HPI [name] 75 y.o. -last visit September 2021.  At that time concern for progressive ILD.  Therefore we decided to PFTs and also high-resolution CT chest.  The high-resolution CT chest compared to last year shows stability.  The PFTs show stability within this year but progression compared to last year.  She is also lost weight following a low carbohydrate diet.  Nevertheless she feels the weight of her symptoms.  She is also using oxygen with exertion.  She feels this is getting worse.  Although her overall symptom burden seems to be improved with the weight loss.  Early morning she says there are some yellow phlegm which is stable for many many years.  This is mostly sinus.  There is no change in this.  She continues on immunosuppressants and antifibrotic.  There is no side effects from these.  She is willing to have labs checked.  She has had a Covid vaccine including booster.  She does not know if she has antibody response yet.  She is willing to get this checked.  She is looking at options to get better.  She will participate in the ILD-pro registry visit today.  Review of the records indicate the last right eye catheterization was in 2018.  Pressures were borderline at that time.  She is willing to get this checked.  Her cardiologist is in [place].  She wants to get the procedure done in [place] and  is willing to see Dr. Haroldine Laws or Dr. Aundra Dubin who are well versed with right heart catheterization locally.  Wt Readings from Last 3 Encounters:  05/13/20 202 lb 12.8 oz (92 kg)  04/02/20 207 lb 9.6 oz (94.2 kg)  02/05/20 218 lb (98.9 kg)      PFT Results Latest Ref Rng & Units 04/01/2020 10/02/2019 05/17/2019 10/01/2018 09/03/2015  FVC-Pre L 1.60 1.62 1.82 1.81 2.18  FVC-Predicted Pre % 65 66 74 72 84  FVC-Post L - - - - 2.17  FVC-Predicted Post % - - - - 83  Pre FEV1/FVC % % 84 83 83 75 79  Post FEV1/FCV % % - - - - 83  FEV1-Pre L 1.35 1.34 1.51 1.36 1.73  FEV1-Predicted Pre % 73 73 82 73 88  FEV1-Post L - - - - 1.80  DLCO uncorrected ml/min/mmHg 11.37 10.42 11.28 11.94 14.82  DLCO UNC% % 66 60 65 69 73  DLCO corrected ml/min/mmHg 11.37 10.42 - - 14.06  DLCO COR %Predicted % 66 60 - - 69  DLVA Predicted % 101 106 99 98 94  TLC L - - - - 3.63  TLC % Predicted % - - - - 76  RV % Predicted % - - - - 61    TECHNIQUE: CT chest sept 2021 Multidetector CT imaging of the chest was performed following the standard protocol without intravenous contrast. High resolution imaging of the lungs, as well as inspiratory and expiratory imaging, was performed.  COMPARISON:  08/16/2018, 02/02/2018, 03/14/2016, 08/27/2015  FINDINGS: Cardiovascular: Aortic atherosclerosis. Normal heart size. Three-vessel coronary artery calcifications. No pericardial effusion.  Mediastinum/Nodes: No enlarged mediastinal, hilar, or axillary lymph nodes. Thyroid gland, trachea, and esophagus demonstrate no significant findings.  Lungs/Pleura: Unchanged moderate pulmonary fibrosis in a pattern with apical to basal gradient featuring irregular peripheral interstitial opacity, septal thickening, ground-glass, and minimal tubular bronchiectasis without evidence of bronchiolectasis or honeycombing. Fibrotic findings are not significantly changed compared to immediate prior examination but significantly  worsened over time on examinations dating back to 08/27/2015 occasional stable, definitively benign small pulmonary nodules, for example a 3 mm nodule of the anterior right upper lobe (series 10, image 77). No significant air trapping on expiratory phase imaging. No pleural effusion or pneumothorax.  Upper Abdomen: No acute abnormality.  Musculoskeletal: No chest wall mass or suspicious bone lesions identified.  IMPRESSION: 1. Unchanged moderate pulmonary fibrosis in a pattern with apical to basal gradient featuring irregular peripheral interstitial opacity, septal thickening, ground-glass, and minimal tubular bronchiectasis without evidence of bronchiolectasis or honeycombing. Fibrotic findings are not significantly changed compared to immediate prior examination but significantly worsened over time on examinations dating back to occasional 08/27/2015. 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 2. Coronary artery disease.  Aortic Atherosclerosis (ICD10-I70.0).   Electronically Signed   By: Eddie Candle M.D.   On: 04/15/2020 15:35    OV 08/19/2020  Subjective:  [name], female , DOB: 1945/10/10 , age 66 y.o. , MRN: [id number] , ADDRESS: [address] [id number] PCP Lorene Dy, MD Patient Care Team: Lorene Dy, MD as PCP - General (Internal Medicine) End, Harrell Gave, MD as PCP - Cardiology (Cardiology) Gavin Pound, MD as Consulting Physician (Rheumatology)  This Provider for this visit: Treatment Team:  Attending Provider: Brand Males, MD    08/19/2020 -   Chief Complaint  Patient presents with  . 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    -  Started on Esbriet in March 2020.  Has progressive phenotype. COVID negative 04/25/2019,. On 02 4L [place] since end 2019  - on ild pro registry  Also hx of asthma -Singulair and inhaler  Also hx of chronic sinusitis - 2017 CT maxillary sinusits - DR Wilburn Cornelia  History of sleep apnea on CPAP  Rheumatoid arthritis seen by Dr. Gavin Pound on leflunomide, Plaquenil and daily prednisone 5 mg/day.    HPI [name] 75 y.o. -presents with her husband.  Last seen in October 2021.  After that in January 2022 she was supposed to have a right heart catheterization because of concern of progressive ILD and her having autoimmune phenotype.  However middle of January 2022 she got sick with bronchitis.  She and her husband say that she gets this sick every year.  They do not know if it was Covid because she did not get tested.  She says primary care physician treated her with 10 days of 20 mg/day prednisone [at baseline she is on 5 mg/day of].  And also doxycycline for 10 days.  This is not helped.  Therefore she is now on amoxicillin and a second course of prednisone 20 mg/day for 10 days.  Her baseline dose is 5 mg prednisone.  She is not feeling better.  In fact her symptom scores are much worse.  Her pulse ox on room air at rest was 88%.  Previously room air at rest was fine.  She is frustrated by her symptoms.  She and her husband are frustrated by the symptom burden.  She is requesting Phenergan with codeine for cough control.     SYMPTOM SCALE - ILD 03/15/2019  05/17/2019  04/02/2020  05/13/2020   08/19/2020   O2 use x      Shortness of Breath 0 -> 5 scale with 5 being worst (score 6 If unable to do)      At rest 0 0 4 0 4  Simple tasks - showers, clothes change, eating, shaving 0 0 _0 Household (dishes, doing bed, laundry) _1 Shopping _2 Walking level at own pace _3 Walking up Stairs _4 Total (40 - 48) Dyspnea Score _5 How bad is your cough? _6 0 4  How bad is your fatigue _7 nauea   0 0 0  vomit   0 0 0  diarrhea   _8 anxiety   _9 depression   _10 Simple office walk 185 feet x  3 laps goal with forehead probe 03/15/2019 - uses 4L [place] at home  04/02/2020  05/13/2020 92% on RA, 4L [place] when walking in 91% 08/19/2020 Uses 4L [place] at rest. Was 88% o RA at rest  O2 used Room air Walked on 3L [place]    Number laps completed 1/2 lap ad desat to 88%     Comments about pace      Resting Pulse Ox/HR  97% and 75    Final Pulse Ox/HR  88% and 108 at 2nd laps    Desaturated </= 88%      Desaturated <= 3% points      Got Tachycardic >/= 90/min  Symptoms at end of test      Miscellaneous comments -walked with 4L [place] - uses 4L [place] at home Needed 6L [place] to correct       PFT  PFT Results Latest Ref Rng & Units 04/01/2020 10/02/2019 05/17/2019 10/01/2018 09/03/2015  FVC-Pre L 1.60 1.62 1.82 1.81 2.18  FVC-Predicted Pre % 65 66 74 72 84  FVC-Post L - - - - 2.17  FVC-Predicted Post % - - - - 83  Pre FEV1/FVC % % 84 83 83 75 79  Post FEV1/FCV % % - - - - 83  FEV1-Pre L 1.35 1.34 1.51 1.36 1.73  FEV1-Predicted Pre % 73 73 82 73 88  FEV1-Post L - - - - 1.80  DLCO uncorrected ml/min/mmHg 11.37 10.42 11.28 11.94 14.82  DLCO UNC% % 66 60 65 69 73  DLCO corrected ml/min/mmHg 11.37 10.42 - - 14.06  DLCO COR %Predicted % 66 60 - - 69  DLVA Predicted % 101 106 99 98 94  TLC L - - - - 3.63  TLC % Predicted % - - - - 78  RV % Predicted % - - - - 61       has a past medical history of Acute asthmatic bronchitis, Allergic rhinitis, Anxiety, Arthritis, Esophageal reflux, Hypertension, Interstitial lung disease ([place]) (dx jan 2020), Irritable bowel syndrome, Oxygen dependent, PONV (postoperative nausea and vomiting), Rheumatoid arthritis(714.0), and Sleep apnea.   reports that she has never smoked. She has never used smokeless tobacco.  Past Surgical History:  Procedure Laterality Date  .  c secttion  1977  . 2 foot fusions  Left    total of 6 left foot sx  . ABDOMINAL HYSTERECTOMY     complete   . ANKLE FUSION  2009   left  . BACK SURGERY     lower l to l 5 fused  . CHOLECYSTECTOMY    . RIGHT/LEFT HEART CATH AND CORONARY ANGIOGRAPHY N/A 09/16/2016   Procedure: Right/Left Heart Cath and Coronary Angiography;  Surgeon: Nelva Bush, MD;  Location: [facility];  Service: Cardiovascular;  Laterality: N/A;  . TONSILLECTOMY    . TOTAL HIP ARTHROPLASTY Right 12/20/2018   Procedure: TOTAL HIP ARTHROPLASTY ANTERIOR APPROACH;  Surgeon: Paralee Cancel, MD;  Location: [facility];  Service: Orthopedics;  Laterality: Right;  70 mins  . TOTAL KNEE ARTHROPLASTY Bilateral     Allergies  Allergen Reactions  . Cefdinir Diarrhea  . Erythromycin Base Diarrhea    Other  . Lactose     Diarrhea    . Lactose Intolerance (Gi) Diarrhea  . Lactulose Diarrhea  . Mesalamine Nausea Only  . Methotrexate Other (See Comments)    unknown  . Nitrofuran Derivatives     shakiness  . Nitrofurantoin Other (See Comments)    shakiness other  . Other Diarrhea and Other (See Comments)    Shaking uncontrollably "lettuce only" "lettuce only"  . Sulfa Antibiotics Other (See Comments)    Patient can't recall reaction   . Tdap [Tetanus-Diphth-Acell Pertussis]     Shaking uncontrollably  . Tetanus Toxoid, Adsorbed Other (See Comments)    Shaking uncontrollable     Immunization History  Administered Date(s) Administered  . Fluad Quad(high Dose 65+) 03/15/2019  . Influenza Split 03/18/2012, 03/18/2013, 04/14/2014, 04/17/2017  . Influenza Whole 04/30/2008, 04/20/2009, 04/18/2011  . Influenza, High Dose Seasonal PF 04/23/2015, 03/24/2016, 04/06/2018, 04/02/2020  . Influenza,inj,quad, With Preservative 04/06/2018  . PFIZER(Purple Top)SARS-COV-2 Vaccination 08/24/2019, 09/18/2019, 04/13/2020  . Pneumococcal Conjugate-13  11/11/2013  . Pneumococcal Polysaccharide-23 05/21/2018  . Zoster Recombinat (Shingrix) 05/05/2017    Family  History  Problem Relation Age of Onset  . Heart disease Mother   . Arthritis Mother   . Heart attack Father   . Diabetes Other        sibling  . Heart attack Other        sibling     Current Outpatient Medications:  .  albuterol (VENTOLIN HFA) 108 (90 Base) MCG/ACT inhaler, USE 2 INHALATIONS BY MOUTH  EVERY 6 HOURS AS NEEDED (Patient taking differently: Inhale 2 puffs into the lungs every 6 (six) hours as needed for wheezing or shortness of breath.), Disp: 34 g, Rfl: 1 .  aspirin EC 81 MG tablet, Take 81 mg by mouth daily., Disp: , Rfl:  .  benzonatate (TESSALON) 200 MG capsule, TAKE 1 CAPSULE BY MOUTH 3 TIMES A DAY AS NEEDED FOR COUGH (Patient taking differently: Take 200 mg by mouth 3 (three) times daily as needed for cough.), Disp: 30 capsule, Rfl: 3 .  celecoxib (CELEBREX) 200 MG capsule, Take 200 mg by mouth daily., Disp: , Rfl:  .  cetirizine (ZYRTEC) 10 MG tablet, Take 10 mg by mouth daily., Disp: , Rfl:  .  clidinium-chlordiazePOXIDE (LIBRAX) 5-2.5 MG capsule, Take 1 capsule by mouth 3 (three) times daily as needed (for IBS)., Disp: , Rfl:  .  colestipol (COLESTID) 1 g tablet, Take 2 g by mouth daily., Disp: , Rfl:  .  dicyclomine (BENTYL) 10 MG capsule, Take 10 mg by mouth 4 (four) times daily -  before meals and at bedtime., Disp: , Rfl:  .  FLUoxetine (PROZAC) 20 MG capsule, Take 20 mg by mouth daily., Disp: , Rfl:  .  fluticasone (FLONASE) 50 MCG/ACT nasal spray, SPRAY 2 SPRAYS INTO EACH NOSTRIL EVERY DAY (Patient taking differently: Place 2 sprays into both nostrils daily.), Disp: 48 mL, Rfl: 3 .  Fluticasone-Umeclidin-Vilant (TRELEGY ELLIPTA) 100-62.5-25 MCG/INH AEPB, Inhale 1 puff into the lungs daily. (Patient taking differently: Inhale 1 puff into the lungs at bedtime.), Disp: 3 each, Rfl: 4 .  furosemide (LASIX) 40 MG tablet, Take 40 mg by mouth daily., Disp: , Rfl:  .  HYDROcodone-acetaminophen (NORCO) 7.5-325 MG tablet, Take 1 tablet by mouth 2 (two) times daily as needed  for pain., Disp: , Rfl:  .  hydroxychloroquine (PLAQUENIL) 200 MG tablet, Take 2 tablets (400 mg total) by mouth daily. Stop for 2 weeks, post-op. (Patient taking differently: Take 400 mg by mouth daily.), Disp: , Rfl:  .  ibuprofen (ADVIL) 200 MG tablet, Take 400 mg by mouth 2 (two) times daily., Disp: , Rfl:  .  ipratropium (ATROVENT) 0.03 % nasal spray, USE 1 TO 2 SPRAYS IN BOTH  NOSTRILS TWICE DAILY (Patient taking differently: Place 2 sprays into both nostrils 2 (two) times daily.), Disp: 90 mL, Rfl: 3 .  ipratropium-albuterol (DUONEB) 0.5-2.5 (3) MG/3ML SOLN, USE 1 VIAL IN NEBULIZER EVERY 6 HOURS AS NEEDED *J45.901* npi [id number] (Patient taking differently: Inhale 3 mLs into the lungs every 6 (six) hours as needed (Shortness of breath). *J45.901* npi [id number]), Disp: 270 mL, Rfl: 11 .  leflunomide (ARAVA) 20 MG tablet, Take 20 mg by mouth daily. , Disp: , Rfl:  .  methocarbamol (ROBAXIN) 750 MG tablet, Take 750 mg by mouth at bedtime as needed for muscle spasms., Disp: , Rfl:  .  mirtazapine (REMERON) 45 MG tablet, Take 45 mg by mouth at bedtime., Disp: , Rfl:  .  montelukast (SINGULAIR) 10 MG tablet, TAKE 1 TABLET BY MOUTH AT  BEDTIME (Patient taking differently: Take 10 mg by mouth at bedtime.), Disp: 90 tablet, Rfl: 3 .  Multiple Vitamin (MULTIVITAMIN) tablet, Take 1 tablet by mouth daily., Disp: , Rfl:  .  omeprazole (PRILOSEC) 40 MG capsule, Take 40 mg by mouth at bedtime. , Disp: , Rfl:  .  OXYGEN, Inhale 4 mLs into the lungs continuous. , Disp: , Rfl:  .  Pirfenidone (ESBRIET) 801 MG TABS, Take 801 mg by mouth 3 (three) times daily., Disp: 90 tablet, Rfl: 11 .  potassium chloride SA (KLOR-CON) 20 MEQ tablet, Take 1 tablet (20 mEq total) by mouth 2 (two) times daily. (Patient taking differently: Take 40 mEq by mouth daily.), Disp: 180 tablet, Rfl: 3 .  predniSONE (DELTASONE) 5 MG tablet, Take 5 mg by mouth daily with breakfast., Disp: , Rfl:  .  rosuvastatin (CRESTOR) 20 MG tablet,  Take 1 tablet (20 mg total) by mouth daily., Disp: 90 tablet, Rfl: 3 .  traMADol (ULTRAM) 50 MG tablet, Take 50 mg by mouth 3 (three) times daily as needed., Disp: , Rfl:  .  verapamil (CALAN-SR) 240 MG CR tablet, Take 1 tablet (240 mg total) by mouth daily., Disp: 90 tablet, Rfl: 3 .  promethazine-codeine (PHENERGAN WITH CODEINE) 6.25-10 MG/5ML syrup, Take 5 mLs by mouth every 6 (six) hours as needed for cough., Disp: 200 mL, Rfl: 0      Objective:   Vitals:   08/19/20 1026  BP: 124/74  Pulse: 100  Temp: 97.8 F (36.6 C)  TempSrc: Oral  SpO2: (!) 88%  Weight: 196 lb 6.4 oz (89.1 kg)  Height: _0  (1.549 m)    Estimated body mass index is 37.11 kg/m as calculated from the following:   Height as of this encounter: _1  (1.549 m).   Weight as of this encounter: 196 lb 6.4 oz (89.1 kg).  _2 @  Filed Weights   08/19/20 1026  Weight: 196 lb 6.4 oz (89.1 kg)     Physical Exam   General: No distress.  Coughs.  Looks upset. Neuro: Alert and Oriented x 3. GCS 15. Speech normal Psych: Pleasant Resp:  Barrel Chest - no.  Wheeze -mild scattered wheezing present, Crackles -bilateral bibasal crackles, No overt respiratory distress CVS: Normal heart sounds. Murmurs - no Ext: Stigmata of Connective Tissue Disease -rheumatoid arthritis HEENT: Normal upper airway. PEERL +. No post nasal drip        Assessment:       ICD-10-CM   1. ILD (interstitial lung disease) ([place])  J84.9 SAR CoV2 Serology (COVID 19)AB(IGG)IA   Am really concerned she has a asthma/ILD flareup that is now going to accelerate her decline in lung function.  I will give her a higher dose steroid course in an attempt to address this progression.  I am worried this is a bad prognostic marker.  I explained to her and the husband that she is now maxed on rheumatoid arthritis and antifibrotic therapies.  Once she settles from this flareup and hopefully she does she will have to get back to getting the right  heart catheterization and if she indeed has pulmonary hypertension we will have to get inhaled treprostinil.  The other option is to participate in research as a care option.  We do have a inhaled nitric oxide study called PULSE.  We have given her the consent form but this will have to be addressed only in a few to  several weeks after she settles down from her flareup.  In addition she is immunosuppressed she has had a Covid vaccine.  It is unclear if the most recent flareup was because of Covid.  Nevertheless the prudent thing to do is to check a Covid IgG if it is negative especially she would qualify for monoclonal antibody by [employer] prophylaxis.    Plan:     Patient Instructions  Chronic respiratory failure with hypoxia (HCC) Interstitial lung disease due to connective tissue disease ([place]) High risk medication use Therapeutic drug monitoring  -Progressive interstitial lung disease.  Currently on maximal therapy -I think currently having a flareup or recovering from a flareup -The flareup caused you to cancel your right heart catheterization -I am concerned that this puts you at risk of further pulmonary fibrosis progression at an accelerated pace   Plan -Complete antibiotic course prescribed by your primary care physician -However we will change the prednisone as follows  -Please take Take prednisone 42m once daily x 3 days, then 349monce daily x 3 days, then 2087mnce daily x 3 days, then prednisone 71m31mce daily  x 3 days and then prednisone 5mg 54m day baseline -Start Phenergan with codeine 5 mL twice daily as needed for palliative control of cough --Continue using oxygen 4 L at rest and up to 8 L with exertion  -Monitor pulse ox with exertion and keep it over 86% -Continue participation in ILD-pro research registry -Continue pirfenidone -Check liver function test today -Re-reefer to Dr. BensiHaroldine Lawsr. D. McEinar Crowright heart catheterization  Asthmatic bronchitis,  moderate persistent, uncomplicated  -There is some wheezing and it is possible that you have a combination of pulmonary fibrosis and asthma flare  Plan -See treatment plan above -Continue inhalers and Singulair as before -Continue nasal hygiene -Any change in color or volume of phlegm then call us fo[place]oreaearly antibiotics and prednisonew-up  Vaccine counseling   Plan  - check covid IgG today -If this is negative he might qualify for monoclonal antibody prophylaxis   Morbid obesity (HCC) [place] continued weight loss  plan -Keep up the good work with the app and also low carbohydrate diet -Continue to lose weight  Obstructive sleep apnea  Plan -Continue CPAP    Follow-up -2-3 weeks 30-minute face-to-face visit with Dr. RamasChase Callerny location or at least a 15-minute phone visit -In 8 weeks do spirometry and DLCO -Second follow-up visit in 8 weeks with Dr. RamasChase Callerminute slot -but after spirometry and DLCO  ( Level 05 visit: Estb 40-54 min   in  visit type: on-site physical face to visit  in total care time and counseling or/and coordination of care by this undersigned MD - Dr MuralBrand Maless includes one or more of the following on this same day 08/19/2020: pre-charting, chart review, note writing, documentation discussion of test results, diagnostic or treatment recommendations, prognosis, risks and benefits of management options, instructions, education, compliance or risk-factor reduction. It excludes time spent by the CMA [facility] staff in the care of the patient. Actual time 40 min)    SIGNATURE    Dr. MuralBrand Males., F.C.C.P,  Pulmonary and Critical Care Medicine Staff Physician, [facility] - Interstitial Lung Disease  Program  Pulmonary FibroOlympia [id number]: [phone] answer or between  15:00h - 7:00h: call [phone] Telephone: [phone]  11:15 AM 08/19/2020

## 2020-08-19 NOTE — Addendum Note (Signed)
Addended by: Coralie Keens on: 08/19/2020 03:55 PM   Modules accepted: Orders

## 2020-08-19 NOTE — Patient Instructions (Addendum)
Chronic respiratory failure with hypoxia (HCC) Interstitial lung disease due to connective tissue disease (HCC) High risk medication use Therapeutic drug monitoring  -Progressive interstitial lung disease.  Currently on maximal therapy -I think currently having a flareup or recovering from a flareup -The flareup caused you to cancel your right heart catheterization -I am concerned that this puts you at risk of further pulmonary fibrosis progression at an accelerated pace   Plan -Complete antibiotic course prescribed by your primary care physician -However we will change the prednisone as follows  -Please take Take prednisone 24m once daily x 3 days, then 369monce daily x 3 days, then 2072mnce daily x 3 days, then prednisone 83m31mce daily  x 3 days and then prednisone 5mg 36m day baseline -Start Phenergan with codeine 5 mL twice daily as needed for palliative control of cough --Continue using oxygen 4 L at rest and up to 8 L with exertion  -Monitor pulse ox with exertion and keep it over 86% -Continue participation in ILD-pro research registry -Continue pirfenidone =- check cbc, bmet -Check liver function test today -Re-reefer to Dr. BensiHaroldine Lawsr. D. McEinar Crowright heart catheterization  Asthmatic bronchitis, moderate persistent, uncomplicated  -There is some wheezing and it is possible that you have a combination of pulmonary fibrosis and asthma flare  Plan -See treatment plan above -Continue inhalers and Singulair as before -Continue nasal hygiene -Any change in color or volume of phlegm then call us foKoreaearly antibiotics and prednisonew-up  Vaccine counseling   Plan  - check covid IgG today -If this is negative he might qualify for monoclonal antibody prophylaxis   Morbid obesity (HCC) [place]ratulations on continued weight loss  plan -Keep up the good work with the app and also low carbohydrate diet -Continue to lose weight  Obstructive sleep  apnea  Plan -Continue CPAP    Follow-up -2-3 weeks 30-minute face-to-face visit with Dr. RamasChase Callerny location or at least a 15-minute phone visit -In 8 weeks do spirometry and DLCO -Second follow-up visit in 8 weeks with Dr. RamasChase Callerminute slot -but after spirometry and DLCO

## 2020-08-25 ENCOUNTER — Telehealth: Payer: Self-pay | Admitting: Internal Medicine

## 2020-08-25 DIAGNOSIS — Z79899 Other long term (current) drug therapy: Secondary | ICD-10-CM

## 2020-08-25 DIAGNOSIS — J849 Interstitial pulmonary disease, unspecified: Secondary | ICD-10-CM

## 2020-08-25 DIAGNOSIS — Z5181 Encounter for therapeutic drug level monitoring: Secondary | ICD-10-CM

## 2020-08-25 NOTE — Telephone Encounter (Signed)
Wbc 12k calcim also on higher side  Plan  - repeat cbc and bmet, calcium in 2-4 weeks

## 2020-08-25 NOTE — Telephone Encounter (Signed)
Called and spoke with pt letting her know results of labwork per MR and stated that he wants to repeat labs in 2-4 weeks. Pt verbalized understanding. Lab orders have been placed. Nothing further needed.

## 2020-09-02 ENCOUNTER — Encounter: Payer: Self-pay | Admitting: Internal Medicine

## 2020-09-02 ENCOUNTER — Ambulatory Visit (INDEPENDENT_AMBULATORY_CARE_PROVIDER_SITE_OTHER): Payer: Medicare Other | Admitting: Internal Medicine

## 2020-09-02 ENCOUNTER — Other Ambulatory Visit: Payer: Self-pay

## 2020-09-02 VITALS — BP 110/60 | HR 96 | Ht 60.0 in | Wt 197.2 lb

## 2020-09-02 DIAGNOSIS — I272 Pulmonary hypertension, unspecified: Secondary | ICD-10-CM

## 2020-09-02 DIAGNOSIS — I2510 Atherosclerotic heart disease of native coronary artery without angina pectoris: Secondary | ICD-10-CM

## 2020-09-02 DIAGNOSIS — J8410 Pulmonary fibrosis, unspecified: Secondary | ICD-10-CM | POA: Diagnosis not present

## 2020-09-02 DIAGNOSIS — E785 Hyperlipidemia, unspecified: Secondary | ICD-10-CM

## 2020-09-02 DIAGNOSIS — I5032 Chronic diastolic (congestive) heart failure: Secondary | ICD-10-CM | POA: Diagnosis not present

## 2020-09-02 NOTE — Patient Instructions (Addendum)
Medication Instructions:  Your physician has recommended you make the following change in your medication:  1- INCREASE Furosemide to 40 mg by mouth two times a day. For 2-3 days, then go back to furosemide 40 mg once a day.  *If you need a refill on your cardiac medications before your next appointment, please call your pharmacy*  Lab Work: Your physician recommends that you return for lab work in: [place].   If you have labs (blood work) drawn today and your tests are completely normal, you will receive your results only by: Marland Kitchen MyChart Message (if you have MyChart) OR . A paper copy in the mail If you have any lab test that is abnormal or we need to change your treatment, we will call you to review the results.   Testing/Procedures: none  Follow-Up: At [facility], you and your health needs are our priority.  As part of our continuing mission to provide you with exceptional heart care, we have created designated Provider Care Teams.  These Care Teams include your primary Cardiologist (physician) and Advanced Practice Providers (APPs -  Physician Assistants and Nurse Practitioners) who all work together to provide you with the care you need, when you need it.  We recommend signing up for the patient portal called "MyChart".  Sign up information is provided on this After Visit Summary.  MyChart is used to connect with patients for Virtual Visits (Telemedicine).  Patients are able to view lab/test results, encounter notes, upcoming appointments, etc.  Non-urgent messages can be sent to your provider as well.   To learn more about what you can do with MyChart, go to [email or web address].    Your next appointment:   12 month(s)  The format for your next appointment:   In Person  Provider:   You may see Nelva Bush, MD or one of the following Advanced Practice Providers on your designated Care Team:    Murray Hodgkins, NP  Christell Faith, PA-C  Marrianne Mood,  PA-C  Cadence Athens, [place]  Laurann Montana, NP

## 2020-09-02 NOTE — Progress Notes (Signed)
Follow-up Outpatient Visit Date: 09/02/2020  Primary Care Provider: Lorene Dy, MD [address], [place] [address]  Chief Complaint: Shortness of breath  HPI:  [name] is a 75 y.o. female with history of nonobstructive coronary artery disease, asthmatic bronchitis,interstitial lung disease,GERD, rheumatoid arthritis, CVA, prediabetes, IBS, and allergic rhinitis, who presents for follow-up of shortness of breath.  We last spoke via virtual visit in 01/2020, at which time [name] was feeling relatively well with stable exertional dyspnea.  She has also been followed in our heart failure clinic by Dr. Haroldine Laws due to pulmonary hypertension.  Repeat right heart catheterization was discussed for potential initiation of Tyvaso for her ILD related PAH.  This was postponed due to flare of bronchitis earlier this year.  She was treated with prednisone burst and antibiotics, with improvement in her shortness of breath to baseline.  Today, [name] reports feeling relatively well.  She continues to have shortness of breath with modest activity and is wearing supplemental oxygen around-the-clock.  She has multiple orthopedic issues and joint pains including in her left shoulder and right hip.  She notes some ankle edema, though this seems to be improving.  He has not had any chest pain or palpitations.  She gets dizzy at times when she moves around but has not fallen.  --------------------------------------------------------------------------------------------------  Past Medical History:  Diagnosis Date  . Acute asthmatic bronchitis   . Allergic rhinitis   . Anxiety   . Arthritis   . Esophageal reflux   . Hypertension   . Interstitial lung disease ([place]) dx jan 2020  . Irritable bowel syndrome   . Oxygen dependent    4 liters day time 6 liters at night  . PONV (postoperative nausea and vomiting)    ponv likes zofran, and scopolamine patch  . Rheumatoid arthritis(714.0)   .  Sleep apnea    Past Surgical History:  Procedure Laterality Date  .  c secttion  1977  . 2 foot fusions Left    total of 6 left foot sx  . ABDOMINAL HYSTERECTOMY     complete   . ANKLE FUSION  2009   left  . BACK SURGERY     lower l to l 5 fused  . CHOLECYSTECTOMY    . RIGHT/LEFT HEART CATH AND CORONARY ANGIOGRAPHY N/A 09/16/2016   Procedure: Right/Left Heart Cath and Coronary Angiography;  Surgeon: Nelva Bush, MD;  Location: [facility];  Service: Cardiovascular;  Laterality: N/A;  . TONSILLECTOMY    . TOTAL HIP ARTHROPLASTY Right 12/20/2018   Procedure: TOTAL HIP ARTHROPLASTY ANTERIOR APPROACH;  Surgeon: Paralee Cancel, MD;  Location: [facility];  Service: Orthopedics;  Laterality: Right;  70 mins  . TOTAL KNEE ARTHROPLASTY Bilateral     Current Meds  Medication Sig  . albuterol (VENTOLIN HFA) 108 (90 Base) MCG/ACT inhaler USE 2 INHALATIONS BY MOUTH  EVERY 6 HOURS AS NEEDED (Patient taking differently: Inhale 2 puffs into the lungs every 6 (six) hours as needed for wheezing or shortness of breath.)  . aspirin EC 81 MG tablet Take 81 mg by mouth daily.  . benzonatate (TESSALON) 200 MG capsule TAKE 1 CAPSULE BY MOUTH 3 TIMES A DAY AS NEEDED FOR COUGH (Patient taking differently: Take 200 mg by mouth 3 (three) times daily as needed for cough.)  . celecoxib (CELEBREX) 200 MG capsule Take 200 mg by mouth daily.  . cetirizine (ZYRTEC) 10 MG tablet Take 10 mg by mouth daily.  . clidinium-chlordiazePOXIDE (  LIBRAX) 5-2.5 MG capsule Take 1 capsule by mouth 3 (three) times daily as needed (for IBS).  . colestipol (COLESTID) 1 g tablet Take 2 g by mouth daily.  Marland Kitchen dicyclomine (BENTYL) 10 MG capsule Take 10 mg by mouth 4 (four) times daily -  before meals and at bedtime.  Marland Kitchen FLUoxetine (PROZAC) 20 MG capsule Take 20 mg by mouth daily.  . fluticasone (FLONASE) 50 MCG/ACT nasal spray SPRAY 2 SPRAYS INTO EACH NOSTRIL EVERY DAY (Patient taking differently: Place 2 sprays into both nostrils daily.)   . Fluticasone-Umeclidin-Vilant (TRELEGY ELLIPTA) 100-62.5-25 MCG/INH AEPB Inhale 1 puff into the lungs daily. (Patient taking differently: Inhale 1 puff into the lungs at bedtime.)  . furosemide (LASIX) 40 MG tablet Take 40 mg by mouth daily.  Marland Kitchen HYDROcodone-acetaminophen (NORCO) 7.5-325 MG tablet Take 1 tablet by mouth 2 (two) times daily as needed for pain.  . hydroxychloroquine (PLAQUENIL) 200 MG tablet Take 2 tablets (400 mg total) by mouth daily. Stop for 2 weeks, post-op. (Patient taking differently: Take 400 mg by mouth daily.)  . ibuprofen (ADVIL) 200 MG tablet Take 400 mg by mouth 2 (two) times daily.  Marland Kitchen ipratropium (ATROVENT) 0.03 % nasal spray USE 1 TO 2 SPRAYS IN BOTH  NOSTRILS TWICE DAILY (Patient taking differently: Place 2 sprays into both nostrils 2 (two) times daily.)  . ipratropium-albuterol (DUONEB) 0.5-2.5 (3) MG/3ML SOLN USE 1 VIAL IN NEBULIZER EVERY 6 HOURS AS NEEDED *J45.901* npi [id number] (Patient taking differently: Inhale 3 mLs into the lungs every 6 (six) hours as needed (Shortness of breath). *J45.901* npi [id number])  . leflunomide (ARAVA) 20 MG tablet Take 20 mg by mouth daily.   . methocarbamol (ROBAXIN) 750 MG tablet Take 750 mg by mouth at bedtime as needed for muscle spasms.  . mirtazapine (REMERON) 45 MG tablet Take 45 mg by mouth at bedtime.  . montelukast (SINGULAIR) 10 MG tablet TAKE 1 TABLET BY MOUTH AT  BEDTIME (Patient taking differently: Take 10 mg by mouth at bedtime.)  . Multiple Vitamin (MULTIVITAMIN) tablet Take 1 tablet by mouth daily.  Marland Kitchen omeprazole (PRILOSEC) 40 MG capsule Take 40 mg by mouth at bedtime.   . OXYGEN Inhale 4 mLs into the lungs continuous.   . Pirfenidone (ESBRIET) 801 MG TABS Take 801 mg by mouth 3 (three) times daily.  . potassium chloride SA (KLOR-CON) 20 MEQ tablet Take 40 mEq by mouth daily.  . predniSONE (DELTASONE) 5 MG tablet Take 5 mg by mouth daily with breakfast.  . promethazine-codeine (PHENERGAN WITH CODEINE) 6.25-10  MG/5ML syrup Take 5 mLs by mouth every 6 (six) hours as needed for cough.  . rosuvastatin (CRESTOR) 20 MG tablet Take 1 tablet (20 mg total) by mouth daily.  . traMADol (ULTRAM) 50 MG tablet Take 50 mg by mouth 3 (three) times daily as needed.  . verapamil (CALAN-SR) 240 MG CR tablet Take 1 tablet (240 mg total) by mouth daily.    Allergies: Cefdinir; Erythromycin base; Lactose; Lactose intolerance (gi); Lactulose; Mesalamine; Methotrexate; Nitrofuran derivatives; Nitrofurantoin; Other; Sulfa antibiotics; Tdap [tetanus-diphth-acell pertussis]; and Tetanus toxoid, adsorbed  Social History   Tobacco Use  . Smoking status: Never Smoker  . Smokeless tobacco: Never Used  . Tobacco comment: positive passive tobacco smoke exposure  Vaping Use  . Vaping Use: Never used  Substance Use Topics  . Alcohol use: Yes    Comment: glass of wine each night   . Drug use: No    Family History  Problem Relation Age of  Onset  . Heart disease Mother   . Arthritis Mother   . Heart attack Father   . Diabetes Other        sibling  . Heart attack Other        sibling    Review of Systems: A 12-system review of systems was performed and was negative except as noted in the HPI.  --------------------------------------------------------------------------------------------------  Physical Exam: BP 110/60 (BP Location: Left Arm, Patient Position: Sitting, Cuff Size: Normal)   Pulse 96   Ht 5' (1.524 m)   Wt 197 lb 4 oz (89.5 kg)   SpO2 98% Comment: 4 liters oxygen  BMI 38.52 kg/m   General:  NAD.  Wearing supplemental oxygen and accompanied by her husband. Neck: No JVD or HJR. Lungs: Coarse breath sounds bilaterally without wheezes or crackles. Heart: Regular rate and rhythm without murmurs, rubs, or gallops. Abdomen: Soft, nontender, nondistended. Extremities: Trace ankle edema bilaterally.  EKG: Normal sinus rhythm with poor R wave progression that likely reflects lead placement.  No  significant change from prior tracing on 07/22/2020.  Lab Results  Component Value Date   WBC 12.0 (H) 08/19/2020   HGB 12.6 08/19/2020   HCT 38.6 08/19/2020   MCV 95.4 08/19/2020   PLT 289.0 08/19/2020    Lab Results  Component Value Date   NA 139 08/19/2020   K 4.4 08/19/2020   CL 102 08/19/2020   CO2 31 08/19/2020   BUN 20 08/19/2020   CREATININE 0.77 08/19/2020   GLUCOSE 77 08/19/2020   ALT 31 08/19/2020    Lab Results  Component Value Date   CHOL 127 01/09/2017   HDL 53 01/09/2017   LDLCALC 38 01/09/2017   LDLDIRECT 53 01/09/2017   TRIG 181 (H) 01/09/2017   CHOLHDL 2.4 01/09/2017    --------------------------------------------------------------------------------------------------  ASSESSMENT AND PLAN: Coronary artery disease without angina: [name] has not had any angina.  I suspect her exertional dyspnea is driven primarily by her pulmonary fibrosis +/-pulmonary hypertension.  We will plan to continue aspirin and statin therapy.  I will check a lipid panel today to ensure appropriate response to rosuvastatin.  Pulmonary fibrosis and pulmonary hypertension: [name] reports relatively stable symptoms.  Other than trace ankle edema, she appears euvolemic on examination today.  Right heart catheterization has been requested to determine eligibility for Tyvaso for treatment of PAH associated with her pulmonary fibrosis.  I will defer ongoing management to Drs. Ramaswamy and Bensimhon.  Hyperlipidemia: We will check a lipid panel today with plans to continue rosuvastatin 20 mg daily for target LDL less than 70 in the setting of moderate CAD by catheterization in 2018.  Follow-up: Return to clinic in 1 year.  Nelva Bush, MD 09/02/2020 11:02 AM

## 2020-09-03 ENCOUNTER — Ambulatory Visit (INDEPENDENT_AMBULATORY_CARE_PROVIDER_SITE_OTHER): Payer: Medicare Other | Admitting: Internal Medicine

## 2020-09-03 DIAGNOSIS — J9611 Chronic respiratory failure with hypoxia: Secondary | ICD-10-CM

## 2020-09-03 DIAGNOSIS — J849 Interstitial pulmonary disease, unspecified: Secondary | ICD-10-CM

## 2020-09-03 DIAGNOSIS — G4733 Obstructive sleep apnea (adult) (pediatric): Secondary | ICD-10-CM

## 2020-09-03 LAB — SPECIMEN STATUS REPORT

## 2020-09-03 LAB — LIPID PANEL
Chol/HDL Ratio: 1.8 ratio (ref 0.0–4.4)
Cholesterol, Total: 135 mg/dL (ref 100–199)
HDL: 74 mg/dL (ref >39)
LDL Chol Calc (NIH): 40 mg/dL (ref 0–99)
Triglycerides: 122 mg/dL (ref 0–149)
VLDL Cholesterol Cal: 21 mg/dL (ref 5–40)

## 2020-09-03 NOTE — Progress Notes (Signed)
05/21/2018- 75 year old female never smoker followed for allergic rhinitis, rhinosinusitis, asthmatic bronchitis, ILD, OSA,complicated by GERD/Crohn's, Rheumatoid arthritis/prednisone -----4 month follow up for asthma and OSA. Per patient she is still having issues with SOB. She has had 2 foot surgeries since her last visit. States that the [name] is working but [name] wanted to switch her to something else. Unsure of the medication name and I did not see it in his last OV.  Singulair, and Anoro, neb duoneb CPAP auto 5-15/Advanced   Download 100% compliance AHI 1.3/hour. I discussed FENO and she will try Symbicort. Discussed inhaled steroids.  We need to get her a new AeroChamber. Has been on sustained antibiotics for failed appliance in her foot. Notices some runny nose and scant bland mucus.  Wheezing has been well controlled.  Little cough.  She and her husband recognize inability to get any exercise as a substantial reason for dyspnea.  No acute events.  Cardiology continues to follow, including her pulmonary hypertension.  We reviewed her most recent chest CT and discussed implications of progressive interstitial fibrosis.  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  I would like to refer her to [name] for his opinion and advice. FENO 03/15/18  49 H CT chest Hi Res 02/02/2018- IMPRESSION: 1. Spectrum of findings compatible with fibrotic interstitial lung disease with a slight basilar predominance, with mild progression since 2017. 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 2. Mild patchy air trapping, unchanged, indicative of small airways disease. 3. Left main and two-vessel coronary atherosclerosis. 4. Stable dilated main pulmonary artery, suggesting chronic pulmonary arterial hypertension. Aortic Atherosclerosis (ICD10-I70.0).  06/08/18-  75 year old female never smoker followed for allergic rhinitis, rhinosinusitis, asthmatic bronchitis, ILD, OSA,complicated by GERD/Crohn's, Rheumatoid arthritis/prednisone -----coughing for 2 weeks, yellow, green mucus, SOB  Singulair, Atrovent nasal spray, Symbicort 160, and Anoro, neb albuterol, neb DuoNeb, Complains of increased cough with chest congestion, green/yellow sputum, low-grade fever 99 degrees over the last 2 weeks.  Not on prednisone now.  Last antibiotic in October. Pending ILD consultation with [name] in December.   07/02/2018  - Visit   75 year old female patient presenting today for acute visit.  Patient was recently treated with a course of Augmentin and reports sputum color is now light green but has decreased in sputum amount since last office visit.  Patient does not feel she is getting better.  Patient remains adherent to Anoro Ellipta.  On arrival to our office today patient was short of breath and oxygen saturations were 81%.  With deep breathing patient was able to increase oxygenation to 85%.  When placed on 2 L O2 patient was able to maintain at 91%.  Patient had previously been on oxygen in 2015 but this was later [facility].  Patient's weight has also increased over the previous office visits, patient remains adherent to diuretic treatment plan as on taking 1 diuretic daily.  Patient reports she is allowed to take an additional diuretic when she feels necessary.  Unfortunately patient has not been weighing herself regularly there is no set plan to when she should take her second diuretic.  Patient is exceptionally sedentary at home and rarely gets out of her bed or lazy boy chair which is about 3 feet from her bed.  Patient's husband takes care of patient completely including getting drinks and food so patient does not have to leave her lazy boy  recliner.  Patient watches many television shows and does not report much activity.   Patient has completed follow-up with  rheumatology last week and no changes to her medications were made.  Patient reports that she was stable at the time.  CPAP compliance report showing 30 out of 30 days used.  All 30 those days greater than 4 hours.  APAP settings 5-15.  Average usage days 7 hours and 56 minutes.  AHI 0.8.    OV 07/31/2018 -referred to interstitial lung disease clinic  Subjective:  [name], female , DOB: 1946/04/21 , age 75 y.o. , MRN: [id number] , ADDRESS: [address] [place] [address]   07/31/2018 -   Chief Complaint  Patient presents with  . Follow-up    Pt of [name] that stated to be referred to ILD clinic.  Pt currently has complaints of wheezing which she has had since December 2019, worsening SOB when moving around and also states she has a pain in left side of chest near shoulder. Pt wears between 2-4L O2 but mainly has been wearing 4L at home.      HPI [name] 75 y.o. -accompanied by her husband.  Referred by [name] pulmonologist in our practice for evaluation of interstitial lung disease in the setting of rheumatoid arthritis.  History is gained from her, talking to her husband, review of recent office visits and also the interstitial lung disease questionnaire.  As best as I can gather   [name] ILD Questionnaire  Symptoms: She is known to have ILD on a CT chest in 2017 but she is not aware of it.  She says that she follows normally for sleep apnea and asthma with [name].  She recollects that approximately 11 or 12 years ago she was hospitalized for pneumonia and was on oxygen for short while not otherwise specified.  Then came off oxygen.  After that overall she is been stable and sedentary using her CPAP.  She is mostly sedentary because of obesity, rheumatoid arthritis and also because of the nonhealing ulcer in her feet for which she is been advised sedentary lifestyle to enable healing.  Most recently to  Thanksgiving 2019 she was seen for respiratory exacerbation and given antibiotics and prednisone.  She followed up mid December 2019 and was found to have new onset hypoxemia 85% on room air.  She was then discharged on portable oxygen which she is using 4 L nasal cannula at rest.  She tells me that at home when she comes off of CPAP she is in her 24s on room air at rest.  In fact in the office today she is 83% on room air at rest but 93% on 2 L nasal cannula at rest.  There is concerned that she has worsening ILD particularly summer 2019 on her CT chest had progressive findings compared to 2 years earlier.  The presumption is that this ILD is caused by rheumatoid arthritis.  In the 2019 CT chest CT is reported as probable UIP but in my personal visualization is either indeterminate or alternative diagnosis.  In terms of symptoms: She reports insidious onset of shortness of breath for some years and gradually getting worse.  Level 2 dyspnea at rest.  Level for dyspnea taking a shower.  Level 5 dyspnea walking at her own pace of walking with others of her age and walking up stairs or walking up a hill.  She does have  a cough with yellow and green sputum.  She does cough at night.  There is also wheezing  Past Medical History : She has longstanding history of rheumatoid arthritis since the 1980s.  Used to be followed by Dr. Danie Binder.  As best as I can gather she used to be on methotrexate maybe 10 or 20 years ago.  She took it for a short while and then stopped it because of diarrhea.  She took this for less than 1 year.  She believes that after that she was basically on pain management.  She is only been on prednisone chronic intake for a total of 1 year approximately 4 to 5 years ago.  Around this time Dr. Hoyt Koch retired and she started seeing Dr. Gavin Pound.  Since then she is been on Arava and Plaquenil.  She has never seen any other immunomodulators in the setting of rheumatoid arthritis  according to history.  Overall the rheumatoid arthritis has caused some disability with deformed joints in her hands and also her using cane.    She is not on any anticoagulation is never had any blood clots.  In 2017 she had vascular extremity duplex venous that was negative for DVT.  Most recently she is been dealing with nonhealing ulcer of the left toe/metatarsal .  She had August and September 2018.  And then 1 in the latter part of 2019.  Most recently she says she has had a duplex lower extremity venous.  I do not have access to these results.  She is waiting on this.  Was done in the last few to several days.   She also has a longstanding history of asthma for which she is on Singulair.  Exam nitric oxide was elevated in August 2019 at 49 ppb but 07/31/2018 I snormal in the 20s though she is wheezing   She has nonobstructive coronary artery disease and in March 2018 had a right heart catheterization with elevated pulmonary pressures but also slightly elevated wedge pressure [see below].  She is started on Lasix  She has sleep apnea for which she uses CPAP.  She has obesity  ROS: Positive for arthralgia and arthritis.  Dry eyes and dry mouth but otherwise negative   FAMILY HISTORY of LUNG DISEASE: Denies   EXPOSURE HISTORY: Never smoked any cigarettes, marijuana, vaping, cocaine or intravenous drug use   HOME and HOBBY DETAILS : [place]family home [place] lived there for 8 years.  No mold.  No dampness.  Does use humidifier and does use CPAP but there is no mold in it.  Also uses nebulizer machine but there is no mold.  No pet birds no musical instruments no guarding habits   OCCUPATIONAL HISTORY (122 questions) : Denies   PULMONARY TOXICITY HISTORY (27 items): She has taken nitrofurantoin 9 years ago.  Has not taken methotrexate for a year 5 years ago and is taken sulfasalazine       Results for [name], [name] (MRN [id number]) as of 07/31/2018 16:31  Ref. Range  09/03/2015 12:45 02/21/2017  FVC-Pre Latest Units: L 2.18 1.7L  FVC-%Pred-Pre Latest Units: % 84 66%   Results for [name], [name] (MRN [id number]) as of 07/31/2018 16:31  Ref. Range 09/03/2015 12:45  DLCO cor Latest Units: ml/min/mmHg 14.06  DLCO cor % pred Latest Units: % 69   HEART CATH MARCH 2018 Right Heart Pressures RA (mean): 13 mmHg RV (S/EDP): 38/12 mmHg PA (S/D, mean): 43/23 (32) mmHg PCWP (mean): 16 mmHg  Conclusions: 1. Mild to moderate, non-obstructive coronary artery disease with 50% mid LAD and 20% proximal RCA stenoses. 2. Mild to moderate pulmonary hypertension with mildly elevated right heart filling pressures. 3. Upper normal left heart filling pressures. 4. Normal Fick cardiac output/index. 5. Equalization of end-diastolic pressures with ventricular concordance. This can be seen in the setting of restrictive process.  Recommendations: 1. Escalate statin therapy to prevent progression of CAD. 2. Increase furosemide to 40 mg BID and KCl to 40 mg BID. Patient to have BMP in 1 week to evaluate renal function and potassium. 3. Continue outpatient pulmonary follow-up. I suspect underlying lung disease is the driving force behind the patient's dyspnea, pulmonary hypertension, and elevated right heart pressures. 4. Outpatient follow-up in cardiology clinic in ~6 weeks.  Nelva Bush, MD   CT chest high res July 2019 - visualized personally - personaly opinion - indeterminate or alt dx for UIP but agree with progression  IMPRESSION: Lungs/Pleura: No pneumothorax. No pleural effusion. No acute consolidative airspace disease or lung masses. Few scattered small solid pulmonary nodules in mid to upper right lung, largest 4 mm in the right upper lobe (series 3/image 46), unchanged since 03/14/2016 CT, considered benign. No new significant pulmonary nodules. Mild patchy air trapping in both lungs on the expiration sequence, unchanged. Patchy peribronchovascular and  subpleural reticulation and ground-glass attenuation throughout both lungs with associated mild traction bronchiectasis and mild architectural distortion. There is a slight basilar gradient to these findings. Tiny focus of honeycombing in anterior right upper lobe (series 8/image 59). These findings have mildly worsened since 03/14/2016 chest CT. 1. Spectrum of findings compatible with fibrotic interstitial lung disease with a slight basilar predominance, with mild progression since 2017. 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 2. Mild patchy air trapping, unchanged, indicative of small airways disease. 3. Left main and two-vessel coronary atherosclerosis. 4. Stable dilated main pulmonary artery, suggesting chronic pulmonary arterial hypertension.  Aortic Atherosclerosis (ICD10-I70.0).   Electronically Signed   By: Ilona Sorrel M.D.   On: 02/02/2018 14:03        OV 03/15/2019  Subjective:  [name], female , DOB: November 12, 1945 , age 29 y.o. , MRN: [id number] , ADDRESS: [address] [place] [address]   03/15/2019 -   Chief Complaint  Patient presents with  . IPD (Interstitial pulmonary disease)    Breathing is doing better since being on Esbriet. Has productive cough with green phelgm. Has been on antibiotic for last 6 weeks. Had hip replacement in June.     HPI TUYEN UNCAPHER 75 y.o. 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  Started on Esbriet in March 2020.  Has progressive phenotype.   She presents for routine follow-up with her husband.  She tells me that ever since starting Esbriet in the spring 2020 she has been doing well.  She does not feel that the disease has progressed.  In fact she feels somewhat better.  She continues to use 4 L of oxygen.  She is now taking the Esbriet at 1 pill  3 times daily.  This is the full dose of 1 pill.  Her last pulmonary function test was in early 2020.  It has been a while since she did her liver function test and she requires a repeat of that today.  She is willing to have her flu shot today.  She is  asking for prescription for emergency electric generator.  She does not know about the [employer] energy assistance program.  She has a handicap sticker.  She is interested in the ILD-PRO research registry study.    She is on Trelegy inhaler and Singulair I am not fully clear why   In June 2020 she had hip replacement for stress fracture and was on 6 weeks of Keflex.  She is slowly improving.  She uses a walker.  OV 05/17/2019  Subjective:  [name], female , DOB: [birth year] , age 84 y.o. , MRN: [id number] , AD[address]ESS: [address] [place] [address]   05/17/2019 -   Chief Complaint  Patient presents with  . Follow-up    Patient reports that she has had a productive cough with yellow sputum and her PCP placed her on an antibiotic.    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  Started on Esbriet in March 2020.  Has progressive phenotype. COVID negative 04/25/2019,. On 02 4L [place] since end 2019  Also hx of asthma  Also hx of chronic sinusitis - 2017 CT maxillary sinusits - DR Wilburn Cornelia  History of sleep apnea on CPAP  HPI [name] 75 y.o. -presents to the ILD clinic for the above issues.  She continues on pirfenidone since March 2020.  She says this is helping her.  No adverse effects.  Last liver function test was August 2020.  She needs another liver function test right now.  No nausea vomiting or diarrhea or weight loss.  She continues on 4 L of oxygen continuously 24/7.  At night she uses CPAP for her sleep apnea.  She and her husband think that she needs 6 L of oxygen at night and they are trying to increase the oxygen.  Although this is not based on data but on  subjective reasoning.    New issue associated with sinus issues: Her main issue today is that for the last 3-week she has had worsening of her cough and there is associated yellow sputum.  Her COVID-19 test was -2 days ago.  This been going on for 3 weeks.  She tells me this happens to her every winter.  She is complaining of chronic postnasal drainage.  In 2017 she had a CT scan of the sinus that showed maxillary sinusitis and saw Dr. Wilburn Cornelia but since then has not followed up.  She does not have a history of sinus surgery.  She is frustrated by the cough and the yellow sputum.  Her pulmonary function test itself is stable since she started the [place].  She is interested in the ILD pro registry study.  In terms of her asthma: She is on Trelegy and Singulair.  She does not want to stop this.  There is a clinical history.  She says that if she stops this it gets worse.   In terms of her sleep apnea: She continues on CPAP with oxygen at night.    ROS    SYMPTOM SCALE - ILD 03/15/2019  05/17/2019   O2 use x   Shortness of Breath 0 -> 5 scale with 5 being worst (score 6 If unable to do)   At rest 0 0  Simple tasks - showers, clothes change, eating, shaving 0 0  Household (dishes, doing bed, laundry) 5 6  Shopping 5 6  Walking level at own pace 3 2  Walking keeping up with others of same age 28  3  Walking up Stairs 5 3  Walking up Hill 5 6  Total (40 - 48) Dyspnea Score 25 26  How bad is your cough? 2 3  How bad is your fatigue 5 5    Simple office walk 185 feet x  3 laps goal with forehead probe 03/15/2019 - uses 4L [place] at home   O2 used Room air  Number laps completed 1/2 lap ad desat to 88%  Comments about pace   Resting Pulse Ox/HR   Final Pulse Ox/HR   Desaturated </= 88%   Desaturated <= 3% points   Got Tachycardic >/= 90/min   Symptoms at end of test   Miscellaneous comments -walked with 4L [place] - uses 4L [place] at home     Results for MYRENE, BOUGHER (MRN [id number]) as of  05/17/2019 10:56  Ref. Range 09/03/2015 12:45 10/01/2018 14:45 05/17/2019 08:59  FVC-Pre Latest Units: L 2.18 1.81 1.82  FVC-%Pred-Pre Latest Units: % 84 72 74  Results for DEBHORA, TITUS (MRN [id number]) as of 05/17/2019 10:56  Ref. Range 09/03/2015 12:45 10/01/2018 14:45 05/17/2019 08:59  DLCO unc Latest Units: ml/min/mmHg 14.82 11.94 11.28  DLCO unc % pred Latest Units: % 73 69 65    IMPRESSION: CT chest Jan  2020 1. Severe tracheobronchomalacia, more pronounced on today's scan. 2. Interval progression of basilar predominant fibrotic interstitial lung disease with particularly increased ground-glass component. No honeycombing. 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 Findings are consistent with UIP per consensus guidelines: Diagnosis of Idiopathic Pulmonary Fibrosis: An Official ATS/ERS/JRS/ALAT Clinical Practice Guideline. [place], Iss 5, [phone], Mar 18 2017. 3. Stable dilated main pulmonary artery, suggesting chronic pulmonary arterial hypertension. 4. Left main and 3 vessel coronary atherosclerosis.  Aortic Atherosclerosis (ICD10-I70.0).   Electronically Signed   By: Ilona Sorrel M.D.   On: 08/20/2018 08:21   - per HPI  OV 04/02/2020  Subjective:  [name], female , DOB: [birth year] , age 87 y.o. 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  Started on Esbriet in March 2020.  Has progressive phenotype. COVID negative 04/25/2019,. On 02 4L [place] since end 2019  - on ild pro registry  Also hx of asthma -Singulair and inhaler  Also hx of chronic sinusitis - 2017 CT maxillary sinusits - DR Wilburn Cornelia  History of sleep apnea on CPAP  Rheumatoid arthritis seen by Dr. Gavin Pound on leflunomide, Plaquenil and daily prednisone 5  mg/day.  04/02/2020 -   Chief Complaint  Patient presents with  . Follow-up    pt is here to go over pft results     HPI [name] 75 y.o. -presents for the above issues of interstitial lung disease in the setting of connective tissue disease.  She is on leflunomide and Plaquenil and prednisone with her rheumatologist.  She is on pirfenidone for her ILD.  I personally last saw her almost a year ago in October 2020.  After that she is seen nurse practitioner.  In the interim she says she is overall stable with the symptom score show a different story she has had significant worsening of her symptoms.  She continues to be morbidly obese.  Walking desaturation test she is desaturating to the point she is requiring 6 L to correct.  In fact  early in August 2020 [place] and was extremely short of breath.  Even at rest on 3 L she was 89%.  She continues to use a CPAP.  Her husband who is here with her today would like her to lose weight in order to help her health status.  Pulmonary function test shows also a decline.  Her last CT scan of the chest was in June 2020    [place] 05/13/2020   Subjective:  [name], female , DOB: March 19, 1946, age 72 y.o. years. 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    - Started on Esbriet in March 2020.  Has progressive phenotype. COVID negative 04/25/2019,. On 02 4L [place] since end 2019  - on ild pro registry  Also hx of asthma -Singulair and inhaler  Also hx of chronic sinusitis - 2017 CT maxillary  sinusits - DR Wilburn Cornelia  History of sleep apnea on CPAP  Rheumatoid arthritis seen by Dr. Gavin Pound on leflunomide, Plaquenil and daily prednisone 5 mg/day.  Elevated pulmonary artery pressures but PVR less than 3 in 2018: Right Heart Pressures RA (mean): 13 mmHg RV (S/EDP): 38/12 mmHg PA (S/D, mean): 43/23 (32) mmHg PCWP (mean): 16 mmHg  Ao sat: 94% PA sat: 70% RA sat: 75%  Fick CO: 6.4 L/min Fick CI: 3.7 L/min/m^2  PVR: 2.5 [place] units     Chief Complaint  Patient presents with  . Follow-up    more sob walking shorter distances.  coughing up yellow phlegm.       HPI [name] 75 y.o. -last visit September 2021.  At that time concern for progressive ILD.  Therefore we decided to PFTs and also high-resolution CT chest.  The high-resolution CT chest compared to last year shows stability.  The PFTs show stability within this year but progression compared to last year.  She is also lost weight following a low carbohydrate diet.  Nevertheless she feels the weight of her symptoms.  She is also using oxygen with exertion.  She feels this is getting worse.  Although her overall symptom burden seems to be improved with the weight loss.  Early morning she says there are some yellow phlegm which is stable for many many years.  This is mostly sinus.  There is no change in this.  She continues on immunosuppressants and antifibrotic.  There is no side effects from these.  She is willing to have labs checked.  She has had a Covid vaccine including booster.  She does not know if she has antibody response yet.  She is willing to get this checked.  She is looking at options to get better.  She will participate in the ILD-pro registry visit today.  Review of the records indicate the last right eye catheterization was in 2018.  Pressures were borderline at that time.  She is willing to get this checked.  Her cardiologist is in [place].  She wants to get the procedure done in [place] and  is willing to see Dr. Haroldine Laws or Dr. Aundra Dubin who are well versed with right heart catheterization locally.  Wt Readings from Last 3 Encounters:  05/13/20 202 lb 12.8 oz (92 kg)  04/02/20 207 lb 9.6 oz (94.2 kg)  02/05/20 218 lb (98.9 kg)      PFT Results Latest Ref Rng & Units 04/01/2020 10/02/2019 05/17/2019 10/01/2018 09/03/2015  FVC-Pre L 1.60 1.62 1.82 1.81 2.18  FVC-Predicted Pre % 65 66 74 72 84  FVC-Post L - - - - 2.17  FVC-Predicted Post % - - - - 83  Pre FEV1/FVC % % 84 83 83 75 79  Post FEV1/FCV % % - - - - 83  FEV1-Pre L 1.35 1.34 1.51 1.36 1.73  FEV1-Predicted Pre % 73 73 82 73 88  FEV1-Post L - - - - 1.80  DLCO uncorrected ml/min/mmHg 11.37 10.42 11.28 11.94 14.82  DLCO UNC% % 66 60 65 69 73  DLCO corrected ml/min/mmHg 11.37 10.42 - - 14.06  DLCO COR %Predicted % 66 60 - - 69  DLVA Predicted % 101 106 99 98 94  TLC L - - - - 3.63  TLC % Predicted % - - - - 78  RV % Predicted % - - - - 59    TECHNIQUE: CT chest sept 2021 Multidetector CT imaging of the chest was performed following the standard protocol without intravenous contrast. High resolution imaging of the lungs, as well as inspiratory and expiratory imaging, was performed.  COMPARISON:  08/16/2018, 02/02/2018, 03/14/2016, 08/27/2015  FINDINGS: Cardiovascular: Aortic atherosclerosis. Normal heart size. Three-vessel coronary artery calcifications. No pericardial effusion.  Mediastinum/Nodes: No enlarged mediastinal, hilar, or axillary lymph nodes. Thyroid gland, trachea, and esophagus demonstrate no significant findings.  Lungs/Pleura: Unchanged moderate pulmonary fibrosis in a pattern with apical to basal gradient featuring irregular peripheral interstitial opacity, septal thickening, ground-glass, and minimal tubular bronchiectasis without evidence of bronchiolectasis or honeycombing. Fibrotic findings are not significantly changed compared to immediate prior examination but significantly  worsened over time on examinations dating back to 08/27/2015 occasional stable, definitively benign small pulmonary nodules, for example a 3 mm nodule of the anterior right upper lobe (series 10, image 77). No significant air trapping on expiratory phase imaging. No pleural effusion or pneumothorax.  Upper Abdomen: No acute abnormality.  Musculoskeletal: No chest wall mass or suspicious bone lesions identified.  IMPRESSION: 1. Unchanged moderate pulmonary fibrosis in a pattern with apical to basal gradient featuring irregular peripheral interstitial opacity, septal thickening, ground-glass, and minimal tubular bronchiectasis without evidence of bronchiolectasis or honeycombing. Fibrotic findings are not significantly changed compared to immediate prior examination but significantly worsened over time on examinations dating back to occasional 08/27/2015. 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 2. Coronary artery disease.  Aortic Atherosclerosis (ICD10-I70.0).   Electronically Signed   By: Eddie Candle M.D.   On: 04/15/2020 15:35    OV 08/19/2020  Subjective:  [name], female , DOB: 1945/10/10 , age 66 y.o. , MRN: [id number] , ADDRESS: [address] [place] [address] PCP Lorene Dy, MD Patient Care Team: Lorene Dy, MD as PCP - General (Internal Medicine) End, Harrell Gave, MD as PCP - Cardiology (Cardiology) Gavin Pound, MD as Consulting Physician (Rheumatology)  This Provider for this visit: Treatment Team:  Attending Provider: Brand Males, MD    08/19/2020 -   Chief Complaint  Patient presents with  . 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    -  Started on Esbriet in March 2020.  Has progressive phenotype. COVID negative 04/25/2019,. On 02 4L [place] since end 2019  - on ild pro registry  Also hx of asthma -Singulair and inhaler  Also hx of chronic sinusitis - 2017 CT maxillary sinusits - DR Wilburn Cornelia  History of sleep apnea on CPAP  Rheumatoid arthritis seen by Dr. Gavin Pound on leflunomide, Plaquenil and daily prednisone 5 mg/day.    HPI [name] 75 y.o. -presents with her husband.  Last seen in October 2021.  After that in January 2022 she was supposed to have a right heart catheterization because of concern of progressive ILD and her having autoimmune phenotype.  However middle of January 2022 she got sick with bronchitis.  She and her husband say that she gets this sick every year.  They do not know if it was Covid because she did not get tested.  She says primary care physician treated her with 10 days of 20 mg/day prednisone [at baseline she is on 5 mg/day of].  And also doxycycline for 10 days.  This is not helped.  Therefore she is now on amoxicillin and a second course of prednisone 20 mg/day for 10 days.  Her baseline dose is 5 mg prednisone.  She is not feeling better.  In fact her symptom scores are much worse.  Her pulse ox on room air at rest was 88%.  Previously room air at rest was fine.  She is frustrated by her symptoms.  She and her husband are frustrated by the symptom burden.  She is requesting Phenergan with codeine for cough control.     SYMPTOM SCALE - ILD 03/15/2019  05/17/2019  04/02/2020  05/13/2020   08/19/2020   O2 use x      Shortness of Breath 0 -> 5 scale with 5 being worst (score 6 If unable to do)      At rest 0 0 4 0 4  Simple tasks - showers, clothes change, eating, shaving 0 0 _0 Household (dishes, doing bed, laundry) _1 Shopping _2 Walking level at own pace _3 Walking up Stairs _4 Total (40 - 48) Dyspnea Score _5 How bad is your cough? _6 0 4  How bad is your fatigue _7 nauea   0 0 0  vomit   0 0 0  diarrhea   _8 anxiety   _9 depression   _10 Simple office walk 185 feet x  3 laps goal with forehead probe 03/15/2019 - uses 4L [place] at home  04/02/2020  05/13/2020 92% on RA, 4L [place] when walking in 91% 08/19/2020 Uses 4L [place] at rest. Was 88% o RA at rest  O2 used Room air Walked on 3L     Number laps completed 1/2 lap ad desat to 88%     Comments about pace      Resting Pulse Ox/HR  97% and 75    Final Pulse Ox/HR  88% and 108 at 2nd laps    Desaturated </= 88%      Desaturated <= 3% points      Got Tachycardic >/= 90/min  Symptoms at end of test      Miscellaneous comments -walked with 4L [place] - uses 4L [place] at home Needed 6L [place] to correct       PFT  PFT Results Latest Ref Rng & Units 04/01/2020 10/02/2019 05/17/2019 10/01/2018 09/03/2015  FVC-Pre L 1.60 1.62 1.82 1.81 2.18  FVC-Predicted Pre % 65 66 74 72 84  FVC-Post L - - - - 2.17  FVC-Predicted Post % - - - - 83  Pre FEV1/FVC % % 84 83 83 75 79  Post FEV1/FCV % % - - - - 83  FEV1-Pre L 1.35 1.34 1.51 1.36 1.73  FEV1-Predicted Pre % 73 73 82 73 88  FEV1-Post L - - - - 1.80  DLCO uncorrected ml/min/mmHg 11.37 10.42 11.28 11.94 14.82  DLCO UNC% % 66 60 65 69 73  DLCO corrected ml/min/mmHg 11.37 10.42 - - 14.06  DLCO COR %Predicted % 66 60 - - 69  DLVA Predicted % 101 106 99 98 94  TLC L - - - - 3.63  TLC % Predicted % - - - - 78  RV % Predicted % - - - - 61      OV 09/03/2020  Subjective:  [name], female , DOB: [birth year] , age 81 y.o. , MRN: [id number] , ADDRESS: [address] [place] [address] PCP Lorene Dy, MD Patient Care Team: Lorene Dy, MD as PCP - General (Internal Medicine) End, Harrell Gave, MD as PCP - Cardiology (Cardiology) Gavin Pound, MD as Consulting Physician (Rheumatology)  This Provider for this visit: Treatment Team:  Attending Provider: Brand Males,  MD  Type of visit: Telephone/Video Circumstance: COVID-19 national emergency Identification of patient [name] with 1946-06-13 and MRN [id number] - 2 person identifier Risks: Risks, benefits, limitations of telephone visit explained. Patient understood and verbalized agreement to proceed Anyone else on call: just patient Patient location: [phone] This provider location: [place]   09/03/2020 -  No chief complaint on file.   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    - Started on Esbriet in March 2020.  Has progressive phenotype. COVID negative 04/25/2019,. On 02 4L [place] since end 2019   - on ild pro registry  Also hx of asthma -Singulair and inhaler  Also hx of chronic sinusitis - 2017 CT maxillary sinusits - DR Wilburn Cornelia  History of sleep apnea on CPAP  Rheumatoid arthritis seen by Dr. Gavin Pound on leflunomide, Plaquenil and daily prednisone 5 mg/day.  Positive Covid IgG - Feb 2022   HPI [name] 75 y.o. -this telephone visit this telephone visit is to follow-up from recent exacerbation early February 2022.  She is finished her prednisone she is feeling back to baseline.  She says her sinuses are congested but she will follow-up with the ENT.  She is still waiting for a right heart catheterization.  She has follow-up later in March 2022 with me.  Oxygen use is baseline.  She had Covid IgG checked and this was normal.  She continues to use a CPAP.  She is regular with all her medications.  She took consent form for PULSE inhaled nitric oxide study and she is interested.  Recent lab review shows slightly high calcium.  She does not know that she has this problem    PFT  PFT Results Latest Ref Rng & Units 04/01/2020 10/02/2019 05/17/2019 10/01/2018 09/03/2015  FVC-Pre L 1.60 1.62 1.82 1.81 2.18  FVC-Predicted Pre % 65 66 74 72 84  FVC-Post L - - - - 2.17  FVC-Predicted Post % - - - - 83  Pre FEV1/FVC % % 84  83 83 75 79  Post FEV1/FCV % % - - - - 83  FEV1-Pre L 1.35 1.34 1.51 1.36 1.73  FEV1-Predicted Pre % 73 73 82 73 88  FEV1-Post L - - - - 1.80  DLCO uncorrected ml/min/mmHg 11.37 10.42 11.28 11.94 14.82  DLCO UNC% % 66 60 65 69 73  DLCO corrected ml/min/mmHg 11.37 10.42 - - 14.06  DLCO COR %Predicted % 66 60 - - 69  DLVA Predicted % 101 106 99 98 94  TLC L - - - - 3.63  TLC % Predicted % - - - - 78  RV % Predicted % - - - - 61       has a past medical history of Acute asthmatic bronchitis, Allergic rhinitis, Anxiety, Arthritis, Esophageal reflux, Hypertension, Interstitial lung disease ([place]) (dx jan 2020), Irritable bowel syndrome, Oxygen dependent, PONV (postoperative nausea and vomiting), Rheumatoid arthritis(714.0), and Sleep apnea.   reports that she has never smoked. She has never used smokeless tobacco.  Past Surgical History:  Procedure Laterality Date  .  c secttion  1977  . 2 foot fusions Left    total of 6 left foot sx  . ABDOMINAL HYSTERECTOMY     complete   . ANKLE FUSION  2009   left  . BACK SURGERY     lower l to l 5 fused  . CHOLECYSTECTOMY    . RIGHT/LEFT HEART CATH AND CORONARY ANGIOGRAPHY N/A 09/16/2016   Procedure: Right/Left Heart Cath and Coronary Angiography;  Surgeon: Nelva Bush, MD;  Location: [facility];  Service: Cardiovascular;  Laterality: N/A;  . TONSILLECTOMY    . TOTAL HIP ARTHROPLASTY Right 12/20/2018   Procedure: TOTAL HIP ARTHROPLASTY ANTERIOR APPROACH;  Surgeon: Paralee Cancel, MD;  Location: [facility];  Service: Orthopedics;  Laterality: Right;  70 mins  . TOTAL KNEE ARTHROPLASTY Bilateral     Allergies  Allergen Reactions  . Cefdinir Diarrhea  . Erythromycin Base Diarrhea    Other  . Lactose     Diarrhea    . Lactose Intolerance (Gi) Diarrhea  . Lactulose Diarrhea  . Mesalamine Nausea Only  . Methotrexate Other (See Comments)    unknown  . Nitrofuran Derivatives     shakiness  . Nitrofurantoin Other (See Comments)     shakiness other  . Other Diarrhea and Other (See Comments)    Shaking uncontrollably "lettuce only" "lettuce only"  . Sulfa Antibiotics Other (See Comments)    Patient can't recall reaction   . Tdap [Tetanus-Diphth-Acell Pertussis]     Shaking uncontrollably  . Tetanus Toxoid, Adsorbed Other (See Comments)    Shaking uncontrollable     Immunization History  Administered Date(s) Administered  . Fluad Quad(high Dose 65+) 03/15/2019  . Influenza Split 03/18/2012, 03/18/2013, 04/14/2014, 04/17/2017  . Influenza Whole 04/30/2008, 04/20/2009, 04/18/2011  . Influenza, High Dose Seasonal PF 04/23/2015, 03/24/2016, 04/06/2018, 04/02/2020  . Influenza,inj,quad, With Preservative 04/06/2018  . PFIZER(Purple Top)SARS-COV-2 Vaccination 08/24/2019, 09/18/2019, 04/13/2020  . Pneumococcal Conjugate-13 11/11/2013  . Pneumococcal Polysaccharide-23 05/21/2018  . Zoster Recombinat (Shingrix) 05/05/2017    Family History  Problem Relation Age of Onset  . Heart disease Mother   . Arthritis Mother   . Heart attack Father   . Diabetes Other        sibling  . Heart  attack Other        sibling     Current Outpatient Medications:  .  albuterol (VENTOLIN HFA) 108 (90 Base) MCG/ACT inhaler, USE 2 INHALATIONS BY MOUTH  EVERY 6 HOURS AS NEEDED (Patient taking differently: Inhale 2 puffs into the lungs every 6 (six) hours as needed for wheezing or shortness of breath.), Disp: 34 g, Rfl: 1 .  aspirin EC 81 MG tablet, Take 81 mg by mouth daily., Disp: , Rfl:  .  benzonatate (TESSALON) 200 MG capsule, TAKE 1 CAPSULE BY MOUTH 3 TIMES A DAY AS NEEDED FOR COUGH (Patient taking differently: Take 200 mg by mouth 3 (three) times daily as needed for cough.), Disp: 30 capsule, Rfl: 3 .  celecoxib (CELEBREX) 200 MG capsule, Take 200 mg by mouth daily., Disp: , Rfl:  .  cetirizine (ZYRTEC) 10 MG tablet, Take 10 mg by mouth daily., Disp: , Rfl:  .  clidinium-chlordiazePOXIDE (LIBRAX) 5-2.5 MG capsule, Take 1 capsule  by mouth 3 (three) times daily as needed (for IBS)., Disp: , Rfl:  .  colestipol (COLESTID) 1 g tablet, Take 2 g by mouth daily., Disp: , Rfl:  .  dicyclomine (BENTYL) 10 MG capsule, Take 10 mg by mouth 4 (four) times daily -  before meals and at bedtime., Disp: , Rfl:  .  FLUoxetine (PROZAC) 20 MG capsule, Take 20 mg by mouth daily., Disp: , Rfl:  .  fluticasone (FLONASE) 50 MCG/ACT nasal spray, SPRAY 2 SPRAYS INTO EACH NOSTRIL EVERY DAY (Patient taking differently: Place 2 sprays into both nostrils daily.), Disp: 48 mL, Rfl: 3 .  Fluticasone-Umeclidin-Vilant (TRELEGY ELLIPTA) 100-62.5-25 MCG/INH AEPB, Inhale 1 puff into the lungs daily. (Patient taking differently: Inhale 1 puff into the lungs at bedtime.), Disp: 3 each, Rfl: 4 .  furosemide (LASIX) 40 MG tablet, Take 40 mg by mouth daily., Disp: , Rfl:  .  HYDROcodone-acetaminophen (NORCO) 7.5-325 MG tablet, Take 1 tablet by mouth 2 (two) times daily as needed for pain., Disp: , Rfl:  .  hydroxychloroquine (PLAQUENIL) 200 MG tablet, Take 2 tablets (400 mg total) by mouth daily. Stop for 2 weeks, post-op. (Patient taking differently: Take 400 mg by mouth daily.), Disp: , Rfl:  .  ibuprofen (ADVIL) 200 MG tablet, Take 400 mg by mouth 2 (two) times daily., Disp: , Rfl:  .  ipratropium (ATROVENT) 0.03 % nasal spray, USE 1 TO 2 SPRAYS IN BOTH  NOSTRILS TWICE DAILY (Patient taking differently: Place 2 sprays into both nostrils 2 (two) times daily.), Disp: 90 mL, Rfl: 3 .  ipratropium-albuterol (DUONEB) 0.5-2.5 (3) MG/3ML SOLN, USE 1 VIAL IN NEBULIZER EVERY 6 HOURS AS NEEDED *J45.901* npi [id number] (Patient taking differently: Inhale 3 mLs into the lungs every 6 (six) hours as needed (Shortness of breath). *J45.901* npi [id number]), Disp: 270 mL, Rfl: 11 .  leflunomide (ARAVA) 20 MG tablet, Take 20 mg by mouth daily. , Disp: , Rfl:  .  methocarbamol (ROBAXIN) 750 MG tablet, Take 750 mg by mouth at bedtime as needed for muscle spasms., Disp: , Rfl:  .   mirtazapine (REMERON) 45 MG tablet, Take 45 mg by mouth at bedtime., Disp: , Rfl:  .  montelukast (SINGULAIR) 10 MG tablet, TAKE 1 TABLET BY MOUTH AT  BEDTIME (Patient taking differently: Take 10 mg by mouth at bedtime.), Disp: 90 tablet, Rfl: 3 .  Multiple Vitamin (MULTIVITAMIN) tablet, Take 1 tablet by mouth daily., Disp: , Rfl:  .  omeprazole (PRILOSEC) 40 MG capsule, Take 40 mg  by mouth at bedtime. , Disp: , Rfl:  .  OXYGEN, Inhale 4 mLs into the lungs continuous. , Disp: , Rfl:  .  Pirfenidone (ESBRIET) 801 MG TABS, Take 801 mg by mouth 3 (three) times daily., Disp: 90 tablet, Rfl: 11 .  potassium chloride SA (KLOR-CON) 20 MEQ tablet, Take 40 mEq by mouth daily., Disp: , Rfl:  .  predniSONE (DELTASONE) 5 MG tablet, Take 5 mg by mouth daily with breakfast., Disp: , Rfl:  .  promethazine-codeine (PHENERGAN WITH CODEINE) 6.25-10 MG/5ML syrup, Take 5 mLs by mouth every 6 (six) hours as needed for cough., Disp: 200 mL, Rfl: 0 .  rosuvastatin (CRESTOR) 20 MG tablet, Take 1 tablet (20 mg total) by mouth daily., Disp: 90 tablet, Rfl: 3 .  traMADol (ULTRAM) 50 MG tablet, Take 50 mg by mouth 3 (three) times daily as needed., Disp: , Rfl:  .  verapamil (CALAN-SR) 240 MG CR tablet, Take 1 tablet (240 mg total) by mouth daily., Disp: 90 tablet, Rfl: 3      Objective:   There were no vitals filed for this visit.  Estimated body mass index is 38.52 kg/m as calculated from the following:   Height as of 09/02/20: 5' (1.524 m).   Weight as of 09/02/20: 197 lb 4 oz (89.5 kg).  _0 @  There were no vitals filed for this visit.   Physical Exam Telephone only visit sounded normal resolved flareup     Assessment:       ICD-10-CM   1. ILD (interstitial lung disease) ([place])  J84.9   2. Obstructive sleep apnea  G47.33   3. Chronic respiratory failure with hypoxia (HCC)  J96.11   4. Hypercalcemia  E83.52        Plan:     Patient Instructions  Chronic respiratory failure with hypoxia  (HCC) Interstitial lung disease due to connective tissue disease (HCC) High risk medication use Therapeutic drug monitoring  -Progressive interstitial lung disease.  Currently on maximal therapy - Plan ---Continue using oxygen 4 L at rest and up to 8 L with exertion  -Monitor pulse ox with exertion and keep it over 86% -Continue participation in ILD-pro research registry -Continue pirfenidone =await call from Dr. Haroldine Laws or Dr. Einar Crow for right heart catheterization -We will have research staff contact you about inhaled nitric oxide study [that you read the consent and you are interested]   Obstructive sleep apnea  Plan -Continue CPAP  Hypercalcemia   -Stop Centrum Silver  Plan -Recheck calcium in a month -Also check with PCP  Follow-up Per schedule later in March 2022  (Telephone visit - Level 03 visit: Estb 21-30 for this visit type which was visit type: telephone visit in total care time and counseling or/and coordination of care by this undersigned MD - Dr Brand Males. This includes one or more of the following for care delivered on 09/03/2020 same day: pre-charting, chart review, note writing, documentation discussion of test results, diagnostic or treatment recommendations, prognosis, risks and benefits of management options, instructions, education, compliance or risk-factor reduction. It excludes time spent by the [facility] or office staff in the care of the patient. Actual time was 25 min. E&M code is [phone])    SIGNATURE    Dr. Brand Males, M.D., F.C.C.P,  Pulmonary and Critical Care Medicine Staff Physician, [place] Director - Interstitial Lung Disease  Program  [facility] [place], [place], [address]  Pager: [phone],  If no answer or between  15:00h - 7:00h: call [phone] Telephone: [phone]  1:08 PM 09/03/2020

## 2020-09-03 NOTE — Patient Instructions (Addendum)
Chronic respiratory failure with hypoxia (HCC) Interstitial lung disease due to connective tissue disease (HCC) High risk medication use Therapeutic drug monitoring  -Progressive interstitial lung disease.  Currently on maximal therapy - Plan ---Continue using oxygen 4 L at rest and up to 8 L with exertion  -Monitor pulse ox with exertion and keep it over 86% -Continue participation in ILD-pro research registry -Continue pirfenidone =await call from Dr. Haroldine Laws or Dr. Einar Crow for right heart catheterization -We will have research staff contact you about inhaled nitric oxide study [that you read the consent and you are interested]   Obstructive sleep apnea  Plan -Continue CPAP  Hypercalcemia   -Stop Centrum Silver  Plan -Recheck calcium in a month -Also check with PCP  Follow-up Per schedule later in March 2022

## 2020-09-04 ENCOUNTER — Encounter: Payer: Self-pay | Admitting: Internal Medicine

## 2020-09-11 ENCOUNTER — Other Ambulatory Visit: Payer: Self-pay | Admitting: *Deleted

## 2020-09-11 ENCOUNTER — Encounter: Payer: Medicare Other | Admitting: *Deleted

## 2020-09-11 ENCOUNTER — Other Ambulatory Visit (INDEPENDENT_AMBULATORY_CARE_PROVIDER_SITE_OTHER): Payer: Medicare Other

## 2020-09-11 ENCOUNTER — Other Ambulatory Visit: Payer: Self-pay

## 2020-09-11 DIAGNOSIS — J849 Interstitial pulmonary disease, unspecified: Secondary | ICD-10-CM

## 2020-09-11 DIAGNOSIS — Z5181 Encounter for therapeutic drug level monitoring: Secondary | ICD-10-CM

## 2020-09-11 DIAGNOSIS — Z79899 Other long term (current) drug therapy: Secondary | ICD-10-CM | POA: Diagnosis not present

## 2020-09-11 DIAGNOSIS — Z006 Encounter for examination for normal comparison and control in clinical research program: Secondary | ICD-10-CM

## 2020-09-11 LAB — PULMONARY FUNCTION TEST
DL/VA % pred: 85 %
DL/VA: 3.58 ml/min/mmHg/L
DLCO unc % pred: 44 %
DLCO unc: 7.59 ml/min/mmHg
FEF 25-75 Pre: 1.23 L/sec
FEF2575-%Pred-Pre: 85 %
FEV1-%Pred-Pre: 70 %
FEV1-Pre: 1.26 L
FEV1FVC-%Pred-Pre: 106 %
FEV6-%Pred-Pre: 69 %
FEV6-Pre: 1.58 L
FEV6FVC-%Pred-Pre: 105 %
FVC-%Pred-Pre: 65 %
FVC-Pre: 1.58 L
Pre FEV1/FVC ratio: 80 %
Pre FEV6/FVC Ratio: 100 %

## 2020-09-11 LAB — CBC WITH DIFFERENTIAL/PLATELET
Basophils Absolute: 0 10*3/uL (ref 0.0–0.1)
Basophils Relative: 0.4 % (ref 0.0–3.0)
Eosinophils Absolute: 0 10*3/uL (ref 0.0–0.7)
Eosinophils Relative: 0.2 % (ref 0.0–5.0)
HCT: 35.9 % — ABNORMAL LOW (ref 36.0–46.0)
Hemoglobin: 11.9 g/dL — ABNORMAL LOW (ref 12.0–15.0)
Lymphocytes Relative: 8.9 % — ABNORMAL LOW (ref 12.0–46.0)
Lymphs Abs: 0.6 10*3/uL — ABNORMAL LOW (ref 0.7–4.0)
MCHC: 33.1 g/dL (ref 30.0–36.0)
MCV: 97.1 fl (ref 78.0–100.0)
Monocytes Absolute: 0.4 10*3/uL (ref 0.1–1.0)
Monocytes Relative: 5.9 % (ref 3.0–12.0)
Neutro Abs: 6 10*3/uL (ref 1.4–7.7)
Neutrophils Relative %: 84.6 % — ABNORMAL HIGH (ref 43.0–77.0)
Platelets: 270 10*3/uL (ref 150.0–400.0)
RBC: 3.7 Mil/uL — ABNORMAL LOW (ref 3.87–5.11)
RDW: 15.2 % (ref 11.5–15.5)
WBC: 7.1 10*3/uL (ref 4.0–10.5)

## 2020-09-11 LAB — BASIC METABOLIC PANEL
BUN: 19 mg/dL (ref 6–23)
CO2: 33 mEq/L — ABNORMAL HIGH (ref 19–32)
Calcium: 10.5 mg/dL (ref 8.4–10.5)
Chloride: 101 mEq/L (ref 96–112)
Creatinine, Ser: 0.85 mg/dL (ref 0.40–1.20)
GFR: 66.55 mL/min (ref >60.00)
Glucose, Bld: 114 mg/dL — ABNORMAL HIGH (ref 70–99)
Potassium: 4.6 mEq/L (ref 3.5–5.1)
Sodium: 139 mEq/L (ref 135–145)

## 2020-09-11 NOTE — Research (Addendum)
Disclaimer: This blurb is a brief overview of the study this patient is participating in. It is for the use of providers caring for the patient. It is not a regulatory declaration, and may or may not reflect all elements of the protocol.  Title:  A Randomized, double-blind, placebo-controlled dose escalation and verification clinical study, to assess the safety and efficacy of pulsed, inhaled nitric oxide (iNO) in subjects at risk of pulmonary hypertension associated with pulmonary fibrosis on long term oxygen therapy (Part 1 and Part 2). Primary Endpoint: The placebo-corrected change for INOpulse in minutes of moderate to vigorous physical activity (MVPA) measured by actigraphy from baseline to month 4.   Duration of treatment: Study participants will receive iNO 45 mcg/kg IBW/hr versus placebo for 4 months (16 weeks) during Part 1. Then in Part 2: Open Label Extension (OLE) Study participants will be offered open label therapy at iNO 45 mcg/kg IBW/hr after completing the Part 1.  Protocol #: PULSE-PHPF-001 (REBUILD), ClinicalTrials.gov Identifier: [id number], **Sponsor is [employer], [place], [place] [address])   Key Inclusion Criteria:   Diagnosed with pulmonary fibrosis (all types except sarcoid) by a high resolution CT scan performed in the 6 months prior to screening  Age between 37 and 47 years (inclusive) Historical right heart catheterization (within 3 years) or current Echocardiogram (within 3 months) as assessed by the Investigator/Radiologist confirming low, or intermediate / high probability of pulmonary hypertension Have been using oxygen therapy (at rest or only with exertion; and ? 10 L/min of oxygen supplementation) by nasal cannula for at least 4 weeks prior to the screening run-in period 6MWD ? 100 meters and ? 400 meters at screening and Baseline/Randomization visits. Forced Vital Capacity ? 40% predicted within the last  6 months prior to the screening  run-in period. WHO Functional Class II-IV    Key Exclusion Criteria:   Demonstrate symptomatic rebound defined as significant cardiopulmonary instability, such as hypoxemia, bradycardia, tachycardia, systemic hypotension, shortness of breath, near-syncope, and syncope, occurring within 1 hour of acute iNO withdrawal during rebound testing Use of a prostacyclin analogue, guanylate cyclase stimulator, or endothelin-receptor antagonist (ERA) PAH-specific medications regardless of reason for use, except for phospho-diesterase type-5 (PDE5) inhibitors. (The use of PDE5 inhibitors regardless of reason for use is allowed as long as the dose has been stable for at least 3 months prior to screening and there are no plans to adjust the dose during the study) The presence of emphysema unless the extent of fibrotic changes is greater than the extent of emphysema on the most recent HRCT scan.   Chronic use of a nitric oxide donor agent such as nitroglycerin or drugs known to increase methemoglobin such as lidocaine, prilocaine, benzocaine, nitroprusside, isosorbide, or dapsone at Screening Systolic heart failure) with an ejection fraction of < 40%; or severe HF with preserved EF (HFpEF; diastolic HF) as assessed by the Principal Investigator. Smoking within 3 months of Screening and/or unwilling to avoid smoking throughout the study Life expectancy of < 6 months,      Pharmacodynamics:  Nitric oxide is a compound produced by many cells of the body. It relaxes vascular smooth muscle by binding to the heme moiety of cytosolic guanylate cyclase, activating guanylate cyclase and increasing intracellular levels of cGMP, which then leads to vasodilation. When inhaled, NO produces pulmonary vasodilation.  There is no measurable effect on systemic arterial pressure.   Pharmacokinetics:  Absorption:  The extent  of absorption of NO was independent of dose and ranged from 85%  to 89% with normal respiration and  91% to 93% with maximum respiration. Elimination:  The average elimination half-life of 15N nitrate from plasma ranged from 5.5 hours to 9.8 hours.  Urinary excretion was the primary route of elimination. Nitrate was the predominant metabolite in the urine.   Contraindications:  No contraindications are known for iNO when administered to participants with PAH, ILD, or PH-COPD on LTOT.   Interactions:  Inhaled NO has been demonstrated to potentiate the effect of lowering PAP when used with vasodilators to treat PAH. Currently approved treatments include phosphodiesterase type 5 (PDE-5) inhibitors (); prostacyclin analogs and receptor agonists (; and endothelin receptor antagonists (ERAs).   Safety Data:  Pulsed iNO/ INOpulse was well tolerated. . The most frequently occurring AE, epistaxis, occurred in similar proportions of study participants in  the 3 dose cohorts (~26-27%).  Dyspnea and peripheral edema  occurred more frequently in the 2 iNO dose cohorts than in the placebo cohort.      Based upon clinical studies, the following events are considered adverse reactions:  Methemoglobinemia  Pulmonary edema associated with pre-existing LVD  Signs or symptoms of symptomatic rebound associated with acute withdrawal of iNO therapy, i.e., within 1 hour of withdrawal of therapy, including: significant cardiopulmonary instability such as systemic arterial O2 desaturation, hypoxemia, bradycardia, tachycardia, systemic hypotension, shortness of breath, near- syncope, syncope, ventricular fibrillation, or cardiac arrest.     Clinical Research Coordinator / Research RN note : This visit for [place] with DOB: 08-Nov-1945 on 09/11/2020 for the above protocol is Visit/Encounter #1  and is for purpose of research.   The consent for this encounter is under Protocol Version Amendment 7 , Investigator Brochure Version 11.0  Consent for Cohort 3 and is currently IRB approved.   Subject expressed  continued interest and consent in continuing as a study subject. Subject confirmed that there was no change in contact information (e.g. address, telephone, email). Subject thanked for participation in research and contribution to science.  In this visit 09/11/2020 the subject will be evaluated by investigator named [name]. This research coordinator has verified that the investigator yes uptodate with his training logs  Because the PI is NOT available due to schedule, the sub-I reported and CRC has confirmed that the PI  has discussed the visit a-priori with the sub-investigator  The CRC has confirmed that the note will be routed to PI by sub-I if possible   Subject came in on [id number] for Screening visit for [name] REBUILD study. [name] completed the consent process with CRC [name] and the subject.  Dr. Darrick Penna. Linna Darner also completed the physical exam. PI, [name] spoke with the subject post consent.  All Screening visit procedures were completed per protocol. For complete details, please see subject binder.      Signed by  [place] Coordinator [facility]  [place], [place] 3:21 PM 09/11/2020

## 2020-09-11 NOTE — Progress Notes (Signed)
pt

## 2020-09-13 NOTE — Progress Notes (Signed)
[name], date of birth 03-Jun-1946, was seen for REBUILD visit 1 on 09/11/2020. She stated that she is dyspneic simply walking across the room and is oxygen dependent. Pertinent physical findings included a large epidermoid inclusion cyst over the left lateral wrist which mimicked a boss.  Sticky rales were present at the lung bases and lower one third of the lungs bilaterally.  Pedal pulses were decreased.  Trace-1/2+ pedal edema was present.  She had isolated DIP, PIP, MCP enlargements.  Interosseous wasting of the hands was present. Darrick Penna. Linna Darner, MD, SI

## 2020-09-18 ENCOUNTER — Other Ambulatory Visit: Payer: Self-pay | Admitting: Internal Medicine

## 2020-09-18 DIAGNOSIS — I272 Pulmonary hypertension, unspecified: Secondary | ICD-10-CM

## 2020-09-18 DIAGNOSIS — Z006 Encounter for examination for normal comparison and control in clinical research program: Secondary | ICD-10-CM

## 2020-09-22 ENCOUNTER — Other Ambulatory Visit: Payer: Self-pay | Admitting: Internal Medicine

## 2020-09-22 DIAGNOSIS — I272 Pulmonary hypertension, unspecified: Secondary | ICD-10-CM

## 2020-09-23 ENCOUNTER — Encounter: Payer: Medicare Other | Admitting: *Deleted

## 2020-09-23 DIAGNOSIS — Z006 Encounter for examination for normal comparison and control in clinical research program: Secondary | ICD-10-CM

## 2020-09-23 DIAGNOSIS — J849 Interstitial pulmonary disease, unspecified: Secondary | ICD-10-CM

## 2020-09-23 NOTE — Telephone Encounter (Signed)
Rx request sent to pharmacy.  

## 2020-09-23 NOTE — Research (Addendum)
Disclaimer: This blurb is a brief overview of the study this patient is participating in. It is for the use of providers caring for the patient. It is not a regulatory declaration, and may or may not reflect all elements of the protocol.  Title:  A Randomized, double-blind, placebo-controlled dose escalation and verification clinical study, to assess the safety and efficacy of pulsed, inhaled nitric oxide (iNO) in subjects at risk of pulmonary hypertension associated with pulmonary fibrosis on long term oxygen therapy (Part 1 and Part 2). Primary Endpoint: The placebo-corrected change for INOpulse in minutes of moderate to vigorous physical activity (MVPA) measured by actigraphy from baseline to month 4.    Duration of treatment: Study participants will receive iNO 45 mcg/kg IBW/hr versus placebo for 4 months (16 weeks) during Part 1. Then in Part 2: Open Label Extension (OLE) Study participants will be offered open label therapy at iNO 45 mcg/kg IBW/hr after completing the Part 1.  Protocol #: PULSE-PHPF-001 (REBUILD), ClinicalTrials.gov Identifier: [id number], **Sponsor is [employer], [place], [place] [address])   Key Inclusion Criteria:   Diagnosed with pulmonary fibrosis (all types except sarcoid) by a high resolution CT scan performed in the 6 months prior to screening  Age between 37 and 74 years (inclusive) Historical right heart catheterization (within 3 years) or current Echocardiogram (within 3 months) as assessed by the Investigator/Radiologist confirming low, or intermediate / high probability of pulmonary hypertension Have been using oxygen therapy (at rest or only with exertion; and ? 10 L/min of oxygen supplementation) by nasal cannula for at least 4 weeks prior to the screening run-in period 6MWD ? 100 meters and ? 400 meters at screening and Baseline/Randomization visits. Forced Vital Capacity ? 40% predicted within the last  6 months prior to the screening  run-in period. WHO Functional Class II-IV    Key Exclusion Criteria:   Demonstrate symptomatic rebound defined as significant cardiopulmonary instability, such as hypoxemia, bradycardia, tachycardia, systemic hypotension, shortness of breath, near-syncope, and syncope, occurring within 1 hour of acute iNO withdrawal during rebound testing Use of a prostacyclin analogue, guanylate cyclase stimulator, or endothelin-receptor antagonist (ERA) PAH-specific medications regardless of reason for use, except for phospho-diesterase type-5 (PDE5) inhibitors. (The use of PDE5 inhibitors regardless of reason for use is allowed as long as the dose has been stable for at least 3 months prior to screening and there are no plans to adjust the dose during the study) The presence of emphysema unless the extent of fibrotic changes is greater than the extent of emphysema on the most recent HRCT scan.   Chronic use of a nitric oxide donor agent such as nitroglycerin or drugs known to increase methemoglobin such as lidocaine, prilocaine, benzocaine, nitroprusside, isosorbide, or dapsone at Screening Systolic heart failure) with an ejection fraction of < 40%; or severe HF with preserved EF (HFpEF; diastolic HF) as assessed by the Principal Investigator. Smoking within 3 months of Screening and/or unwilling to avoid smoking throughout the study Life expectancy of < 6 months,      Pharmacodynamics:  Nitric oxide is a compound produced by many cells of the body. It relaxes vascular smooth muscle by binding to the heme moiety of cytosolic guanylate cyclase, activating guanylate cyclase and increasing intracellular levels of cGMP, which then leads to vasodilation. When inhaled, NO produces pulmonary vasodilation.  There is no measurable effect on systemic arterial pressure.   Pharmacokinetics:  Absorption:  The extent  of absorption of NO was independent of dose and ranged from  85% to 89% with normal respiration and  91% to 93% with maximum respiration. Elimination:  The average elimination half-life of 15N nitrate from plasma ranged from 5.5 hours to 9.8 hours.  Urinary excretion was the primary route of elimination. Nitrate was the predominant metabolite in the urine.   Contraindications:  No contraindications are known for iNO when administered to participants with PAH, ILD, or PH-COPD on LTOT.   Interactions:  Inhaled NO has been demonstrated to potentiate the effect of lowering PAP when used with vasodilators to treat PAH. Currently approved treatments include phosphodiesterase type 5 (PDE-5) inhibitors (); prostacyclin analogs and receptor agonists (; and endothelin receptor antagonists (ERAs).   Safety Data:  Pulsed iNO/ INOpulse was well tolerated. . The most frequently occurring AE, epistaxis, occurred in similar proportions of study participants in  the 3 dose cohorts (~26-27%).  Dyspnea and peripheral edema  occurred more frequently in the 2 iNO dose cohorts than in the placebo cohort.      Based upon clinical studies, the following events are considered adverse reactions:  Methemoglobinemia  Pulmonary edema associated with pre-existing LVD  Signs or symptoms of symptomatic rebound associated with acute withdrawal of iNO therapy, i.e., within 1 hour of withdrawal of therapy, including: significant cardiopulmonary instability such as systemic arterial O2 desaturation, hypoxemia, bradycardia, tachycardia, systemic hypotension, shortness of breath, near- syncope, syncope, ventricular fibrillation, or cardiac arrest.    Clinical Research Coordinator / Research RN note : This visit for Subject [name] with DOB on 1945-10-23 for the above protocol is Visit/Encounter #2 Screening Run-In and is for purpose of research.  The consent for this encounter is under Protocol Amendment 7 and is currently IRB approved. Subject expressed continued interest and consent in continuing as a study subject.  Subject confirmed that there was no change in contact information (e.g. address, telephone, email). Subject thanked for participation in research and contribution to science.    Visit completed per the above listed protocol, refer to the subject's binder for complete details.       Signed by   [name] Clinical Research Coordinator  [facility]  [place], [place] 5:39 PM 09/23/2020

## 2020-10-01 ENCOUNTER — Telehealth (HOSPITAL_COMMUNITY): Payer: Self-pay | Admitting: Vascular Surgery

## 2020-10-01 NOTE — Telephone Encounter (Signed)
Pt  Called to schedule cath w/ db.. please advise

## 2020-10-02 ENCOUNTER — Other Ambulatory Visit: Payer: Self-pay

## 2020-10-02 ENCOUNTER — Ambulatory Visit: Payer: Medicare Other

## 2020-10-02 DIAGNOSIS — Z006 Encounter for examination for normal comparison and control in clinical research program: Secondary | ICD-10-CM

## 2020-10-02 DIAGNOSIS — I272 Pulmonary hypertension, unspecified: Secondary | ICD-10-CM

## 2020-10-07 ENCOUNTER — Other Ambulatory Visit: Payer: Self-pay | Admitting: Pulmonary Disease

## 2020-10-07 DIAGNOSIS — J4530 Mild persistent asthma, uncomplicated: Secondary | ICD-10-CM

## 2020-10-12 ENCOUNTER — Other Ambulatory Visit (HOSPITAL_COMMUNITY)
Admission: RE | Admit: 2020-10-12 | Discharge: 2020-10-12 | Disposition: A | Discharge status: Home / Self Care | Payer: Medicare Other | Source: Ambulatory Visit | Attending: Internal Medicine | Admitting: Internal Medicine

## 2020-10-12 DIAGNOSIS — Z20822 Contact with and (suspected) exposure to covid-19: Secondary | ICD-10-CM | POA: Diagnosis not present

## 2020-10-12 DIAGNOSIS — Z01812 Encounter for preprocedural laboratory examination: Secondary | ICD-10-CM | POA: Insufficient documentation

## 2020-10-12 LAB — SARS CORONAVIRUS 2 (TAT 6-24 HRS): SARS Coronavirus 2: NEGATIVE

## 2020-10-13 ENCOUNTER — Encounter: Payer: Medicare Other | Admitting: *Deleted

## 2020-10-13 DIAGNOSIS — Z006 Encounter for examination for normal comparison and control in clinical research program: Secondary | ICD-10-CM

## 2020-10-13 DIAGNOSIS — J849 Interstitial pulmonary disease, unspecified: Secondary | ICD-10-CM

## 2020-10-13 NOTE — Research (Signed)
Title:  A Randomized double-blind placebo-controlled dose escalation and verification clinical study to assess the safety and efficacy of pulsed, inhaled nitric oxide (iNO) in subjects at risk of pulmonary hypertension associated with pulmonary fibrosis on long term oxygen therapy (Part 1 and Part 2). Primary Endpoint: The placebo-corrected change for INOpulse in minutes of moderate to vigorous physical activity (MVPA) measured by actigraphy from baseline to month 4. Duration of treatment: Study participants will receive iNO 45 mcg/kg IBW/hr versus placebo for 4 months (16 weeks) during Part 1.  Part 2: Open Label Extension (OLE) Study participants will be offered open label therapy at iNO 45 mcg/kg IBW/hr after completing the Part 1. Protocol #: PULSE-PHPF-001 (REBUILD), ClinicalTrials.gov Identifier: [id number], **Sponsor is [employer], [place], [place] [address])   Scientist, physiological / Research RN note : This visit for [name] with DOB: May 19, 1946 on 10/13/2020 for the above protocol is Visit/Encounter #3 Randomization and is for purpose of research.   The consent for this encounter is under Protocol Version Amendment 7- dated 20_12_2021, Investigator Brochure Version 11.0 approved 02 Mar 2020 and is currently IRB approved.   Subject expressed continued interest and consent in continuing as a study subject. Subject confirmed that there was no change in contact information (e.g. address, telephone, email). Subject thanked for participation in research and contribution to science.  In this visit 10/13/2020 the subject will be evaluated by investigator named [name].  This research coordinator has verified that the investigator is uptodate with his training logs  Because the PI is NOT available due to scheduling issues, the sub-I reported and CRC has confirmed that the PI has discussed the visit a-priori with the sub-investigator  Subject came in  on 10/13/2020 for randomization visit for [name] REBUILD study. [name] completed the physical exam and reviewed and signed the Inclusion and Exclusion criteria with [name]. Dr. Brand Males spoke with the subject prior to departure. All randomization visit procedures were completed per protocol. For complete details, please see the subject binder.   Signed by [name] Clinical Research Coordinator [facility]  [place], [place] 2:25 PM 10/13/2020

## 2020-10-14 NOTE — Progress Notes (Signed)
[name] ,date of birth 9/20 02/1946, was seen 10/13/2020 for history and physical @ REBUILD Trial Visit #3. Her history is unchanged in reference to cardiopulmonary symptoms.  Although there is no change in the distance before onset of dyspnea; she states that using the wrist tracker she has documented that she has been more active with "more steps". Positive physical findings included a stable cyst at the left lateral wrist; sticky rales in the lower one third of the posterior thorax; decreased pedal pulses; one half-1+ edema over the feet/ankles; marked interosseous wasting;&  mixed DIP/PIP/MCP arthritic changes. She is oxygen dependent and ambulates with a rolling walker.                                                             Darrick Penna. Linna Darner, MD, SI

## 2020-10-15 ENCOUNTER — Ambulatory Visit (INDEPENDENT_AMBULATORY_CARE_PROVIDER_SITE_OTHER): Payer: Medicare Other | Admitting: Internal Medicine

## 2020-10-15 ENCOUNTER — Other Ambulatory Visit: Payer: Self-pay

## 2020-10-15 ENCOUNTER — Encounter: Payer: Self-pay | Admitting: Internal Medicine

## 2020-10-15 VITALS — BP 120/62 | HR 78 | Temp 97.9°F | Ht 60.0 in | Wt 196.1 lb

## 2020-10-15 DIAGNOSIS — J849 Interstitial pulmonary disease, unspecified: Secondary | ICD-10-CM

## 2020-10-15 DIAGNOSIS — J9611 Chronic respiratory failure with hypoxia: Secondary | ICD-10-CM | POA: Diagnosis not present

## 2020-10-15 DIAGNOSIS — I2510 Atherosclerotic heart disease of native coronary artery without angina pectoris: Secondary | ICD-10-CM | POA: Diagnosis not present

## 2020-10-15 DIAGNOSIS — Z79899 Other long term (current) drug therapy: Secondary | ICD-10-CM

## 2020-10-15 DIAGNOSIS — G4733 Obstructive sleep apnea (adult) (pediatric): Secondary | ICD-10-CM | POA: Diagnosis not present

## 2020-10-15 DIAGNOSIS — Z006 Encounter for examination for normal comparison and control in clinical research program: Secondary | ICD-10-CM

## 2020-10-15 LAB — PULMONARY FUNCTION TEST
DL/VA % pred: 97 %
DL/VA: 4.13 ml/min/mmHg/L
DLCO cor % pred: 54 %
DLCO cor: 9.05 ml/min/mmHg
DLCO unc % pred: 54 %
DLCO unc: 9.05 ml/min/mmHg
FEF 25-75 Pre: 1.29 L/sec
FEF2575-%Pred-Pre: 92 %
FEV1-%Pred-Pre: 70 %
FEV1-Pre: 1.21 L
FEV1FVC-%Pred-Pre: 110 %
FEV6-%Pred-Pre: 67 %
FEV6-Pre: 1.47 L
FEV6FVC-%Pred-Pre: 105 %
FVC-%Pred-Pre: 63 %
FVC-Pre: 1.47 L
Pre FEV1/FVC ratio: 83 %
Pre FEV6/FVC Ratio: 100 %

## 2020-10-15 MED ORDER — PREDNISONE 10 MG (21) PO TBPK
ORAL_TABLET | Freq: Every day | ORAL | 0 refills | Status: DC
Start: 2020-10-15 — End: 2020-11-09

## 2020-10-15 MED ORDER — DOXYCYCLINE HYCLATE 100 MG PO TABS: 100.0000 mg | ORAL_TABLET | Freq: Two times a day (BID) | ORAL | 0 refills | Status: DC

## 2020-10-15 NOTE — Patient Instructions (Addendum)
Chronic respiratory failure with hypoxia (HCC) Interstitial lung disease due to connective tissue disease (HCC) High risk medication use Therapeutic drug monitoring REsearch subject - Pulse iNO study - randomized 3/29/222  -Cointued Progressive interstitial lung disease.  Currently on maximal therapy - Plan ---Continue using oxygen 4 L at rest and 5-o 8 L with exertion  -Monitor pulse ox with exertion and keep it over 86% -Continue participation in ILD-pro research registry -Continue pirfenidone =Continue iNO pulsed device per study protocol  Obstructive sleep apnea  Plan -Continue CPAP  Acute sinusitis - new x 4 days symptms  Take doxycycline 160m po twice daily x 5 days; take after meals and avoid sunlight Please take prednisone 40 mg x1 day, then 30 mg x1 day, then 20 mg x1 day, then 10 mg x1 day, and then 5 mg baseline to continue   Asthma - trelegy might be too strong for you or excessive  Plan  - could try symbicort 80/4,5 2 puff twice daily  - use alb as needed  Followup  - research visit per protocol  - make Standard of care visit in 6 months - 30 min - we can cancel v keep it if needed

## 2020-10-15 NOTE — Progress Notes (Signed)
Spirometry/DLCO performed today.

## 2020-10-15 NOTE — Progress Notes (Signed)
05/21/2018- 75 year old female never smoker followed for allergic rhinitis, rhinosinusitis, asthmatic bronchitis, ILD, OSA,complicated by GERD/Crohn's, Rheumatoid arthritis/prednisone -----4 month follow up for asthma and OSA. Per patient she is still having issues with SOB. She has had 2 foot surgeries since her last visit. States that the [name] is working but [name] wanted to switch her to something else. Unsure of the medication name and I did not see it in his last OV.  Singulair, and Anoro, neb duoneb CPAP auto 5-15/Advanced   Download 100% compliance AHI 1.3/hour. I discussed FENO and she will try Symbicort. Discussed inhaled steroids.  We need to get her a new AeroChamber. Has been on sustained antibiotics for failed appliance in her foot. Notices some runny nose and scant bland mucus.  Wheezing has been well controlled.  Little cough.  She and her husband recognize inability to get any exercise as a substantial reason for dyspnea.  No acute events.  Cardiology continues to follow, including her pulmonary hypertension.  We reviewed her most recent chest CT and discussed implications of progressive interstitial fibrosis.  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  I would like to refer her to [name] for his opinion and advice. FENO 03/15/18  49 H CT chest Hi Res 02/02/2018- IMPRESSION: 1. Spectrum of findings compatible with fibrotic interstitial lung disease with a slight basilar predominance, with mild progression since 2017. 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 2. Mild patchy air trapping, unchanged, indicative of small airways disease. 3. Left main and two-vessel coronary atherosclerosis. 4. Stable dilated main pulmonary artery, suggesting chronic pulmonary arterial hypertension. Aortic Atherosclerosis (ICD10-I70.0).  06/08/18-  75 year old female never smoker followed for allergic rhinitis, rhinosinusitis, asthmatic bronchitis, ILD, OSA,complicated by GERD/Crohn's, Rheumatoid arthritis/prednisone -----coughing for 2 weeks, yellow, green mucus, SOB  Singulair, Atrovent nasal spray, Symbicort 160, and Anoro, neb albuterol, neb DuoNeb, Complains of increased cough with chest congestion, green/yellow sputum, low-grade fever 99 degrees over the last 2 weeks.  Not on prednisone now.  Last antibiotic in October. Pending ILD consultation with [name] in December.   07/02/2018  - Visit   75 year old female patient presenting today for acute visit.  Patient was recently treated with a course of Augmentin and reports sputum color is now light green but has decreased in sputum amount since last office visit.  Patient does not feel she is getting better.  Patient remains adherent to Anoro Ellipta.  On arrival to our office today patient was short of breath and oxygen saturations were 81%.  With deep breathing patient was able to increase oxygenation to 85%.  When placed on 2 L O2 patient was able to maintain at 91%.  Patient had previously been on oxygen in 2015 but this was later [facility].  Patient's weight has also increased over the previous office visits, patient remains adherent to diuretic treatment plan as on taking 1 diuretic daily.  Patient reports she is allowed to take an additional diuretic when she feels necessary.  Unfortunately patient has not been weighing herself regularly there is no set plan to when she should take her second diuretic.  Patient is exceptionally sedentary at home and rarely gets out of her bed or lazy boy chair which is about 3 feet from her bed.  Patient's husband takes care of patient completely including getting drinks and food so patient does not have to leave her lazy boy  recliner.  Patient watches many television shows and does not report much activity.   Patient has completed follow-up with  rheumatology last week and no changes to her medications were made.  Patient reports that she was stable at the time.  CPAP compliance report showing 30 out of 30 days used.  All 30 those days greater than 4 hours.  APAP settings 5-15.  Average usage days 7 hours and 56 minutes.  AHI 0.8.    OV 07/31/2018 -referred to interstitial lung disease clinic  Subjective:  [name], female , DOB: 1946/04/21 , age 53 y.o. , MRN: [id number] , ADDRESS: [address] [place] [address]   07/31/2018 -   Chief Complaint  Patient presents with  . Follow-up    [name] that stated to be referred to ILD clinic.  Pt currently has complaints of wheezing which she has had since December 2019, worsening SOB when moving around and also states she has a pain in left side of chest near shoulder. Pt wears between 2-4L O2 but mainly has been wearing 4L at home.      HPI HAYLE PARISI 75 y.o. -accompanied by her husband.  Referred by [name] pulmonologist in our practice for evaluation of interstitial lung disease in the setting of rheumatoid arthritis.  History is gained from her, talking to her husband, review of recent office visits and also the interstitial lung disease questionnaire.  As best as I can gather   [facility] ILD Questionnaire  Symptoms: She is known to have ILD on a CT chest in 2017 but she is not aware of it.  She says that she follows normally for sleep apnea and asthma with [name].  She recollects that approximately 11 or 12 years ago she was hospitalized for pneumonia and was on oxygen for short while not otherwise specified.  Then came off oxygen.  After that overall she is been stable and sedentary using her CPAP.  She is mostly sedentary because of obesity, rheumatoid arthritis and also because of the nonhealing ulcer in her feet for which she is been advised sedentary lifestyle to enable healing.  Most recently to  Thanksgiving 2019 she was seen for respiratory exacerbation and given antibiotics and prednisone.  She followed up mid December 2019 and was found to have new onset hypoxemia 85% on room air.  She was then discharged on portable oxygen which she is using 4 L nasal cannula at rest.  She tells me that at home when she comes off of CPAP she is in her 24s on room air at rest.  In fact in the office today she is 83% on room air at rest but 93% on 2 L nasal cannula at rest.  There is concerned that she has worsening ILD particularly summer 2019 on her CT chest had progressive findings compared to 2 years earlier.  The presumption is that this ILD is caused by rheumatoid arthritis.  In the 2019 CT chest CT is reported as probable UIP but in my personal visualization is either indeterminate or alternative diagnosis.  In terms of symptoms: She reports insidious onset of shortness of breath for some years and gradually getting worse.  Level 2 dyspnea at rest.  Level for dyspnea taking a shower.  Level 5 dyspnea walking at her own pace of walking with others of her age and walking up stairs or walking up a hill.  She does have  a cough with yellow and green sputum.  She does cough at night.  There is also wheezing  Past Medical History : She has longstanding history of rheumatoid arthritis since the 1980s.  Used to be followed by Dr. Danie Binder.  As best as I can gather she used to be on methotrexate maybe 10 or 20 years ago.  She took it for a short while and then stopped it because of diarrhea.  She took this for less than 1 year.  She believes that after that she was basically on pain management.  She is only been on prednisone chronic intake for a total of 1 year approximately 4 to 5 years ago.  Around this time Dr. Hoyt Koch retired and she started seeing Dr. Gavin Pound.  Since then she is been on Arava and Plaquenil.  She has never seen any other immunomodulators in the setting of rheumatoid arthritis  according to history.  Overall the rheumatoid arthritis has caused some disability with deformed joints in her hands and also her using cane.    She is not on any anticoagulation is never had any blood clots.  In 2017 she had vascular extremity duplex venous that was negative for DVT.  Most recently she is been dealing with nonhealing ulcer of the left toe/metatarsal .  She had August and September 2018.  And then 1 in the latter part of 2019.  Most recently she says she has had a duplex lower extremity venous.  I do not have access to these results.  She is waiting on this.  Was done in the last few to several days.   She also has a longstanding history of asthma for which she is on Singulair.  Exam nitric oxide was elevated in August 2019 at 49 ppb but 07/31/2018 I snormal in the 20s though she is wheezing   She has nonobstructive coronary artery disease and in March 2018 had a right heart catheterization with elevated pulmonary pressures but also slightly elevated wedge pressure [see below].  She is started on Lasix  She has sleep apnea for which she uses CPAP.  She has obesity  ROS: Positive for arthralgia and arthritis.  Dry eyes and dry mouth but otherwise negative   FAMILY HISTORY of LUNG DISEASE: Denies   EXPOSURE HISTORY: Never smoked any cigarettes, marijuana, vaping, cocaine or intravenous drug use   HOME and HOBBY DETAILS : [place] home [place] lived [place] for 8 years.  No mold.  No dampness.  Does use humidifier and does use CPAP but there is no mold in it.  Also uses nebulizer machine but there is no mold.  No pet birds no musical instruments no guarding habits   OCCUPATIONAL HISTORY (122 questions) : Denies   PULMONARY TOXICITY HISTORY (27 items): She has taken nitrofurantoin 9 years ago.  Has not taken methotrexate for a year 5 years ago and is taken sulfasalazine       Results for SOPHYA, VANBLARCOM (MRN [id number]) as of 07/31/2018 16:31  Ref. Range  09/03/2015 12:45 02/21/2017  FVC-Pre Latest Units: L 2.18 1.7L  FVC-%Pred-Pre Latest Units: % 84 66%   Results for JANIRA, MANDELL (MRN [id number]) as of 07/31/2018 16:31  Ref. Range 09/03/2015 12:45  DLCO cor Latest Units: ml/min/mmHg 14.06  DLCO cor % pred Latest Units: % 69   HEART CATH MARCH 2018 Right Heart Pressures RA (mean): 13 mmHg RV (S/EDP): 38/12 mmHg PA (S/D, mean): 43/23 (32) mmHg PCWP (mean): 16 mmHg  Conclusions: 1. Mild to moderate, non-obstructive coronary artery disease with 50% mid LAD and 20% proximal RCA stenoses. 2. Mild to moderate pulmonary hypertension with mildly elevated right heart filling pressures. 3. Upper normal left heart filling pressures. 4. Normal Fick cardiac output/index. 5. Equalization of end-diastolic pressures with ventricular concordance. This can be seen in the setting of restrictive process.  Recommendations: 1. Escalate statin therapy to prevent progression of CAD. 2. Increase furosemide to 40 mg BID and KCl to 40 mg BID. Patient to have BMP in 1 week to evaluate renal function and potassium. 3. Continue outpatient pulmonary follow-up. I suspect underlying lung disease is the driving force behind the patient's dyspnea, pulmonary hypertension, and elevated right heart pressures. 4. Outpatient follow-up in cardiology clinic in ~6 weeks.  Nelva Bush, MD   CT chest high res July 2019 - visualized personally - personaly opinion - indeterminate or alt dx for UIP but agree with progression  IMPRESSION: Lungs/Pleura: No pneumothorax. No pleural effusion. No acute consolidative airspace disease or lung masses. Few scattered small solid pulmonary nodules in mid to upper right lung, largest 4 mm in the right upper lobe (series 3/image 46), unchanged since 03/14/2016 CT, considered benign. No new significant pulmonary nodules. Mild patchy air trapping in both lungs on the expiration sequence, unchanged. Patchy peribronchovascular and  subpleural reticulation and ground-glass attenuation throughout both lungs with associated mild traction bronchiectasis and mild architectural distortion. There is a slight basilar gradient to these findings. Tiny focus of honeycombing in anterior right upper lobe (series 8/image 59). These findings have mildly worsened since 03/14/2016 chest CT. 1. Spectrum of findings compatible with fibrotic interstitial lung disease with a slight basilar predominance, with mild progression since 2017. 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 2. Mild patchy air trapping, unchanged, indicative of small airways disease. 3. Left main and two-vessel coronary atherosclerosis. 4. Stable dilated main pulmonary artery, suggesting chronic pulmonary arterial hypertension.  Aortic Atherosclerosis (ICD10-I70.0).   Electronically Signed   By: Ilona Sorrel M.D.   On: 02/02/2018 14:03        OV 03/15/2019  Subjective:  [name], female , DOB: November 12, 1945 , age 75 y.o. , MRN: [id number] , ADDRESS: [address] [place] [address]   03/15/2019 -   Chief Complaint  Patient presents with  . IPD (Interstitial pulmonary disease)    Breathing is doing better since being on Esbriet. Has productive cough with green phelgm. Has been on antibiotic for last 6 weeks. Had hip replacement in June.     HPI TUYEN UNCAPHER 75 y.o. 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  Started on Esbriet in March 2020.  Has progressive phenotype.   She presents for routine follow-up with her husband.  She tells me that ever since starting Esbriet in the spring 2020 she has been doing well.  She does not feel that the disease has progressed.  In fact she feels somewhat better.  She continues to use 4 L of oxygen.  She is now taking the Esbriet at 1 pill  3 times daily.  This is the full dose of 1 pill.  Her last pulmonary function test was in early 2020.  It has been a while since she did her liver function test and she requires a repeat of that today.  She is willing to have her flu shot today.  She is  asking for prescription for emergency electric generator.  She does not know about the [employer] energy assistance program.  She has a handicap sticker.  She is interested in the ILD-PRO research registry study.    She is on Trelegy inhaler and Singulair I am not fully clear why   In June 2020 she had hip replacement for stress fracture and was on 6 weeks of Keflex.  She is slowly improving.  She uses a walker.  OV 05/17/2019  Subjective:  [name], female , DOB: [birth year] , age 84 y.o. , MRN: [id number] , AD[address]ESS: [address] [place] [address]   05/17/2019 -   Chief Complaint  Patient presents with  . Follow-up    Patient reports that she has had a productive cough with yellow sputum and her PCP placed her on an antibiotic.    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  Started on Esbriet in March 2020.  Has progressive phenotype. COVID negative 04/25/2019,. On 02 4L [place] since end 2019  Also hx of asthma  Also hx of chronic sinusitis - 2017 CT maxillary sinusits - DR Wilburn Cornelia  History of sleep apnea on CPAP  HPI EMORI MUMME 75 y.o. -presents to the ILD clinic for the above issues.  She continues on pirfenidone since March 2020.  She says this is helping her.  No adverse effects.  Last liver function test was August 2020.  She needs another liver function test right now.  No nausea vomiting or diarrhea or weight loss.  She continues on 4 L of oxygen continuously 24/7.  At night she uses CPAP for her sleep apnea.  She and her husband think that she needs 6 L of oxygen at night and they are trying to increase the oxygen.  Although this is not based on data but on  subjective reasoning.    New issue associated with sinus issues: Her main issue today is that for the last 3-week she has had worsening of her cough and there is associated yellow sputum.  Her COVID-19 test was -2 days ago.  This been going on for 3 weeks.  She tells me this happens to her every winter.  She is complaining of chronic postnasal drainage.  In 2017 she had a CT scan of the sinus that showed maxillary sinusitis and saw Dr. Wilburn Cornelia but since then has not followed up.  She does not have a history of sinus surgery.  She is frustrated by the cough and the yellow sputum.  Her pulmonary function test itself is stable since she started the [place].  She is interested in the ILD pro registry study.  In terms of her asthma: She is on Trelegy and Singulair.  She does not want to stop this.  There is a clinical history.  She says that if she stops this it gets worse.   In terms of her sleep apnea: She continues on CPAP with oxygen at night.    ROS    SYMPTOM SCALE - ILD 03/15/2019  05/17/2019   O2 use x   Shortness of Breath 0 -> 5 scale with 5 being worst (score 6 If unable to do)   At rest 0 0  Simple tasks - showers, clothes change, eating, shaving 0 0  Household (dishes, doing bed, laundry) 5 6  Shopping 5 6  Walking level at own pace 3 2  Walking keeping up with others of same age 28  3  Walking up Stairs 5 3  Walking up Hill 5 6  Total (40 - 48) Dyspnea Score 25 26  How bad is your cough? 2 3  How bad is your fatigue 5 5    Simple office walk 185 feet x  3 laps goal with forehead probe 03/15/2019 - uses 4L [place] at home   O2 used Room air  Number laps completed 1/2 lap ad desat to 88%  Comments about pace   Resting Pulse Ox/HR   Final Pulse Ox/HR   Desaturated </= 88%   Desaturated <= 3% points   Got Tachycardic >/= 90/min   Symptoms at end of test   Miscellaneous comments -walked with 4L [place] - uses 4L [place] at home     Results for MYRENE, BOUGHER (MRN [id number]) as of  05/17/2019 10:56  Ref. Range 09/03/2015 12:45 10/01/2018 14:45 05/17/2019 08:59  FVC-Pre Latest Units: L 2.18 1.81 1.82  FVC-%Pred-Pre Latest Units: % 84 72 74  Results for DEBHORA, TITUS (MRN [id number]) as of 05/17/2019 10:56  Ref. Range 09/03/2015 12:45 10/01/2018 14:45 05/17/2019 08:59  DLCO unc Latest Units: ml/min/mmHg 14.82 11.94 11.28  DLCO unc % pred Latest Units: % 73 69 65    IMPRESSION: CT chest Jan  2020 1. Severe tracheobronchomalacia, more pronounced on today's scan. 2. Interval progression of basilar predominant fibrotic interstitial lung disease with particularly increased ground-glass component. No honeycombing. 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 Findings are consistent with UIP per consensus guidelines: Diagnosis of Idiopathic Pulmonary Fibrosis: An Official ATS/ERS/JRS/ALAT Clinical Practice Guideline. [place], Iss 5, [phone], Mar 18 2017. 3. Stable dilated main pulmonary artery, suggesting chronic pulmonary arterial hypertension. 4. Left main and 3 vessel coronary atherosclerosis.  Aortic Atherosclerosis (ICD10-I70.0).   Electronically Signed   By: Ilona Sorrel M.D.   On: 08/20/2018 08:21   - per HPI  OV 04/02/2020  Subjective:  [name], female , DOB: [birth year] , age 87 y.o. 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  Started on Esbriet in March 2020.  Has progressive phenotype. COVID negative 04/25/2019,. On 02 4L [place] since end 2019  - on ild pro registry  Also hx of asthma -Singulair and inhaler  Also hx of chronic sinusitis - 2017 CT maxillary sinusits - DR Wilburn Cornelia  History of sleep apnea on CPAP  Rheumatoid arthritis seen by Dr. Gavin Pound on leflunomide, Plaquenil and daily prednisone 5  mg/day.  04/02/2020 -   Chief Complaint  Patient presents with  . Follow-up    pt is here to go over pft results     HPI TEDDIE CURD 75 y.o. -presents for the above issues of interstitial lung disease in the setting of connective tissue disease.  She is on leflunomide and Plaquenil and prednisone with her rheumatologist.  She is on pirfenidone for her ILD.  I personally last saw her almost a year ago in October 2020.  After that she is seen nurse practitioner.  In the interim she says she is overall stable with the symptom score show a different story she has had significant worsening of her symptoms.  She continues to be morbidly obese.  Walking desaturation test she is desaturating to the point she is requiring 6 L to correct.  In fact  early in August 2020 [place] and was extremely short of breath.  Even at rest on 3 L she was 89%.  She continues to use a CPAP.  Her husband who is here with her today would like her to lose weight in order to help her health status.  Pulmonary function test shows also a decline.  Her last CT scan of the chest was in June 2020    [place] 05/13/2020   Subjective:  [name], female , DOB: March 19, 1946, age 72 y.o. years. 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    - Started on Esbriet in March 2020.  Has progressive phenotype. COVID negative 04/25/2019,. On 02 4L [place] since end 2019  - on ild pro registry  Also hx of asthma -Singulair and inhaler  Also hx of chronic sinusitis - 2017 CT maxillary  sinusits - DR Wilburn Cornelia  History of sleep apnea on CPAP  Rheumatoid arthritis seen by Dr. Gavin Pound on leflunomide, Plaquenil and daily prednisone 5 mg/day.  Elevated pulmonary artery pressures but PVR less than 3 in 2018: Right Heart Pressures RA (mean): 13 mmHg RV (S/EDP): 38/12 mmHg PA (S/D, mean): 43/23 (32) mmHg PCWP (mean): 16 mmHg  Ao sat: 94% PA sat: 70% RA sat: 75%  Fick CO: 6.4 L/min Fick CI: 3.7 L/min/m^2  PVR: 2.5 [place] units     Chief Complaint  Patient presents with  . Follow-up    more sob walking shorter distances.  coughing up yellow phlegm.       HPI NIKHITA MENTZEL 75 y.o. -last visit September 2021.  At that time concern for progressive ILD.  Therefore we decided to PFTs and also high-resolution CT chest.  The high-resolution CT chest compared to last year shows stability.  The PFTs show stability within this year but progression compared to last year.  She is also lost weight following a low carbohydrate diet.  Nevertheless she feels the weight of her symptoms.  She is also using oxygen with exertion.  She feels this is getting worse.  Although her overall symptom burden seems to be improved with the weight loss.  Early morning she says there are some yellow phlegm which is stable for many many years.  This is mostly sinus.  There is no change in this.  She continues on immunosuppressants and antifibrotic.  There is no side effects from these.  She is willing to have labs checked.  She has had a Covid vaccine including booster.  She does not know if she has antibody response yet.  She is willing to get this checked.  She is looking at options to get better.  She will participate in the ILD-pro registry visit today.  Review of the records indicate the last right eye catheterization was in 2018.  Pressures were borderline at that time.  She is willing to get this checked.  Her cardiologist is in [place].  She wants to get the procedure done in [place] and  is willing to see Dr. Haroldine Laws or Dr. Aundra Dubin who are well versed with right heart catheterization locally.  Wt Readings from Last 3 Encounters:  05/13/20 202 lb 12.8 oz (92 kg)  04/02/20 207 lb 9.6 oz (94.2 kg)  02/05/20 218 lb (98.9 kg)      PFT Results Latest Ref Rng & Units 04/01/2020 10/02/2019 05/17/2019 10/01/2018 09/03/2015  FVC-Pre L 1.60 1.62 1.82 1.81 2.18  FVC-Predicted Pre % 65 66 74 72 84  FVC-Post L - - - - 2.17  FVC-Predicted Post % - - - - 83  Pre FEV1/FVC % % 84 83 83 75 79  Post FEV1/FCV % % - - - - 83  FEV1-Pre L 1.35 1.34 1.51 1.36 1.73  FEV1-Predicted Pre % 73 73 82 73 88  FEV1-Post L - - - - 1.80  DLCO uncorrected ml/min/mmHg 11.37 10.42 11.28 11.94 14.82  DLCO UNC% % 66 60 65 69 73  DLCO corrected ml/min/mmHg 11.37 10.42 - - 14.06  DLCO COR %Predicted % 66 60 - - 69  DLVA Predicted % 101 106 99 98 94  TLC L - - - - 3.63  TLC % Predicted % - - - - 78  RV % Predicted % - - - - 61    TECHNIQUE: CT chest sept 2021 Multidetector CT imaging of the chest was performed following the standard protocol without intravenous contrast. High resolution imaging of the lungs, as well as inspiratory and expiratory imaging, was performed.  COMPARISON:  08/16/2018, 02/02/2018, 03/14/2016, 08/27/2015  FINDINGS: Cardiovascular: Aortic atherosclerosis. Normal heart size. Three-vessel coronary artery calcifications. No pericardial effusion.  Mediastinum/Nodes: No enlarged mediastinal, hilar, or axillary lymph nodes. Thyroid gland, trachea, and esophagus demonstrate no significant findings.  Lungs/Pleura: Unchanged moderate pulmonary fibrosis in a pattern with apical to basal gradient featuring irregular peripheral interstitial opacity, septal thickening, ground-glass, and minimal tubular bronchiectasis without evidence of bronchiolectasis or honeycombing. Fibrotic findings are not significantly changed compared to immediate prior examination but significantly  worsened over time on examinations dating back to 08/27/2015 occasional stable, definitively benign small pulmonary nodules, for example a 3 mm nodule of the anterior right upper lobe (series 10, image 77). No significant air trapping on expiratory phase imaging. No pleural effusion or pneumothorax.  Upper Abdomen: No acute abnormality.  Musculoskeletal: No chest wall mass or suspicious bone lesions identified.  IMPRESSION: 1. Unchanged moderate pulmonary fibrosis in a pattern with apical to basal gradient featuring irregular peripheral interstitial opacity, septal thickening, ground-glass, and minimal tubular bronchiectasis without evidence of bronchiolectasis or honeycombing. Fibrotic findings are not significantly changed compared to immediate prior examination but significantly worsened over time on examinations dating back to occasional 08/27/2015. 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 2. Coronary artery disease.  Aortic Atherosclerosis (ICD10-I70.0).   Electronically Signed   By: Eddie Candle M.D.   On: 04/15/2020 15:35    OV 08/19/2020  Subjective:  [name], female , DOB: 1945/10/10 , age 66 y.o. , MRN: [id number] , ADDRESS: [address] [place] [address] PCP Lorene Dy, MD Patient Care Team: Lorene Dy, MD as PCP - General (Internal Medicine) End, Harrell Gave, MD as PCP - Cardiology (Cardiology) Gavin Pound, MD as Consulting Physician (Rheumatology)  This Provider for this visit: Treatment Team:  Attending Provider: Brand Males, MD    08/19/2020 -   Chief Complaint  Patient presents with  . 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    -  Started on Esbriet in March 2020.  Has progressive phenotype. COVID negative 04/25/2019,. On 02 4L [place] since end 2019  - on ild pro registry  Also hx of asthma -Singulair and inhaler  Also hx of chronic sinusitis - 2017 CT maxillary sinusits - DR Wilburn Cornelia  History of sleep apnea on CPAP  Rheumatoid arthritis seen by Dr. Gavin Pound on leflunomide, Plaquenil and daily prednisone 5 mg/day.    HPI STAMATIA MASRI 75 y.o. -presents with her husband.  Last seen in October 2021.  After that in January 2022 she was supposed to have a right heart catheterization because of concern of progressive ILD and her having autoimmune phenotype.  However middle of January 2022 she got sick with bronchitis.  She and her husband say that she gets this sick every year.  They do not know if it was Covid because she did not get tested.  She says primary care physician treated her with 10 days of 20 mg/day prednisone [at baseline she is on 5 mg/day of].  And also doxycycline for 10 days.  This is not helped.  Therefore she is now on amoxicillin and a second course of prednisone 20 mg/day for 10 days.  Her baseline dose is 5 mg prednisone.  She is not feeling better.  In fact her symptom scores are much worse.  Her pulse ox on room air at rest was 88%.  Previously room air at rest was fine.  She is frustrated by her symptoms.  She and her husband are frustrated by the symptom burden.  She is requesting Phenergan with codeine for cough control.    PFT  PFT Results Latest Ref Rng & Units 04/01/2020 10/02/2019 05/17/2019 10/01/2018 09/03/2015  FVC-Pre L 1.60 1.62 1.82 1.81 2.18  FVC-Predicted Pre % 65 66 74 72 84  FVC-Post L - - - - 2.17  FVC-Predicted Post % - - - - 83  Pre FEV1/FVC % % 84 83 83 75 79  Post FEV1/FCV % % - - - - 83  FEV1-Pre L 1.35 1.34 1.51 1.36 1.73  FEV1-Predicted Pre % 73 73 82 73 88  FEV1-Post L - - - - 1.80  DLCO uncorrected ml/min/mmHg 11.37 10.42 11.28 11.94 14.82  DLCO UNC% % 66 60 65 69 73   DLCO corrected ml/min/mmHg 11.37 10.42 - - 14.06  DLCO COR %Predicted % 66 60 - - 69  DLVA Predicted % 101 106 99 98 94  TLC L - - - - 3.63  TLC % Predicted % - - - - 78  RV % Predicted % - - - - 61      OV 09/03/2020  Subjective:  [name], female , DOB: Dec 11, 1945 , age 12 y.o. , MRN: [id number] , AD[address]ESS: [address] [place] [address] PCP Lorene Dy, MD Patient Care Team: Lorene Dy, MD as PCP - General (Internal Medicine) End, Harrell Gave, MD as PCP - Cardiology (Cardiology) Gavin Pound, MD as Consulting Physician (Rheumatology)  This Provider for this visit: Treatment Team:  Attending Provider: Brand Males, MD  Type of visit: Telephone/Video Circumstance: COVID-19 national emergency Identification of patient ELENY CORTEZ with Nov 30, 1945 and MRN [id number] - 2 person identifier Risks: Risks, benefits, limitations of telephone visit explained. Patient understood and verbalized agreement to proceed Anyone else on call: just patient Patient location: [phone] This provider location: [address]  09/03/2020 -  No chief complaint on file.   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    - Started on Esbriet in March 2020.  Has progressive phenotype. COVID negative 04/25/2019,. On 02 4L [place] since end 2019   - on ild pro registry  Also hx of asthma -Singulair and inhaler  Also hx of chronic sinusitis - 2017 CT maxillary sinusits - DR Wilburn Cornelia  History of sleep apnea on CPAP  Rheumatoid arthritis seen by Dr. Gavin Pound on leflunomide, Plaquenil and daily prednisone 5 mg/day.  Positive Covid IgG - Feb 2022   HPI VELERA LANSDALE 75 y.o. -this telephone visit this telephone visit is to follow-up from recent exacerbation early February 2022.  She is finished her prednisone she is feeling back to baseline.  She says her sinuses are congested but she will follow-up with  the ENT.  She is still waiting for a right heart catheterization.  She has follow-up later in March 2022 with me.  Oxygen use is baseline.  She had Covid IgG checked and this was normal.  She continues to use a CPAP.  She is regular with all her medications.  She took consent form for PULSE inhaled nitric oxide study and she is interested.  Recent lab review shows slightly high calcium.  She does not know that she has this problem    PFT  PFT Results Latest Ref Rng & Units 04/01/2020 10/02/2019 05/17/2019 10/01/2018 09/03/2015  FVC-Pre L 1.60 1.62 1.82 1.81 2.18  FVC-Predicted Pre % 65 66 74 72 84  FVC-Post L - - - - 2.17  FVC-Predicted Post % - - - - 83  Pre FEV1/FVC % % 84 83 83 75 79  Post FEV1/FCV % % - - - - 83  FEV1-Pre L 1.35 1.34 1.51 1.36 1.73  FEV1-Predicted Pre % 73 73 82 73 88  FEV1-Post L - - - - 1.80  DLCO uncorrected ml/min/mmHg 11.37 10.42 11.28 11.94 14.82  DLCO UNC% % 66 60 65 69 73  DLCO corrected ml/min/mmHg 11.37 10.42 - - 14.06  DLCO COR %Predicted % 66 60 - - 69  DLVA Predicted % 101 106 99 98 94  TLC L - - - - 3.63  TLC % Predicted % - - - - 78  RV % Predicted % - - - - 61    OV 10/15/2020  Subjective:  [name], female , DOB: [birth year] , age 70 y.o. , MRN: [id number] , ADDRESS: [address] [place] [address] PCP Lorene Dy, MD Patient Care Team: Lorene Dy, MD as PCP - General (Internal Medicine) End, Harrell Gave, MD as PCP - Cardiology (Cardiology) Gavin Pound, MD as Consulting Physician (Rheumatology)  This Provider for this visit: Treatment Team:  Attending Provider: Brand Males, MD    10/15/2020 -   Chief Complaint  Patient presents with  . Follow-up    She felt she was breathing deeper before pft.      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    - Started on Esbriet in March 2020.  Has progressive phenotype. COVID negative 04/25/2019,. On 02  4L [place] since end 2019   - on ild pro registry  - started PULSE iNO study march 2022 via study protocol -randomized trial  Also hx of asthma -Singulair and inhaler trelegy  Also hx of chronic sinusitis - 2017 CT maxillary sinusits - DR Wilburn Cornelia  History of sleep  apnea on CPAP  Rheumatoid arthritis seen by Dr. Gavin Pound on leflunomide, Plaquenil and daily prednisone 5 mg/day.  Positive Covid IgG - Feb 2022   HPI FAHIMA CIFELLI 75 y.o. -presents for standard of care visit.  She is now on inhaled nitric oxide study.  The inhaled nitric oxide is being bled through her oxygen.  She has to wear 12 hours a day.  She was randomized 2 days ago.  She tells me for the last 4 days she is having sinus complaints of yellow sinus drainage and increased cough.  She feels as acute sinusitis and she will benefit from antibiotics and prednisone.  Specifically she did not mention this at the time of randomization.  She denied this at the time of randomization but now she says it was present for 4 days.  She had pulmonary function test that shows continued decline in ILD.  Her oxygen use remains the same at 4 L and with exertion she uses 5 L.  She is on Trelegy for asthma.  Her CT scan of the chest in September did not show any evidence of emphysema.  She uses CPAP for her sleep apnea.  Labs from research were reviewed by me.  Her most recent hemoglobin is 12.  Creatinine and liver function test was normal.  Calcium is not available.   Of note after starting the inhaled nitric oxide she feels she can breathe more deeper.  However his symptom score shows continued decline.     SYMPTOM SCALE - ILD 03/15/2019  05/17/2019  04/02/2020  05/13/2020   08/19/2020  10/15/2020   O2 use x       Shortness of Breath 0 -> 5 scale with 5 being worst (score 6 If unable to do)       At rest 0 0 4 0 4 2  Simple tasks - showers, clothes change, eating, shaving 0 0 _0 Household (dishes, doing bed, laundry) _1 Shopping _2 Walking level at own pace _3 Walking up Stairs _4 Total (40 - 48) Dyspnea Score _5 How bad is your cough? _6 0 4 3  How bad is your fatigue _7 nauea   0 0 0 0  vomit   0 0 0 0  diarrhea   _8 anxiety   _9 depression   _10 Simple office walk 185 feet x  3 laps goal with forehead probe 03/15/2019 - uses 4L [place] at home  04/02/2020  05/13/2020 92% on RA, 4L [place] when walking in 91% 08/19/2020 Uses 4L [place] at rest. Was 88% o RA at rest  O2 used Room air Walked on 3L [place]    Number laps completed 1/2 lap ad desat to 88%     Comments about pace      Resting Pulse Ox/HR  97% and 75    Final Pulse Ox/HR  88% and 108 at 2nd laps    Desaturated </= 88%      Desaturated <= 3% points      Got Tachycardic >/= 90/min      Symptoms at end of test      Miscellaneous comments -walked with 4L [place] - uses  4L [place] at home Needed 6L [place] to correct        PFT  PFT Results Latest Ref Rng & Units 10/15/2020 09/11/2020 04/01/2020 10/02/2019 05/17/2019 10/01/2018 09/03/2015  FVC-Pre L 1.47 1.58 1.60 1.62 1.82 1.81 2.18  FVC-Predicted Pre % 63 65 65 66 74 72 84  FVC-Post L - - - - - - 2.17  FVC-Predicted Post % - - - - - - 83  Pre FEV1/FVC % % 83 80 84 83 83 75 79  Post FEV1/FCV % % - - - - - - 83  FEV1-Pre L 1.21 1.26 1.35 1.34 1.51 1.36 1.73  FEV1-Predicted Pre % 70 70 73 73 82 73 88  FEV1-Post L - - - - - - 1.80  DLCO uncorrected ml/min/mmHg 9.05 7.59 11.37 10.42 11.28 11.94 14.82  DLCO UNC% % 54 44 66 60 65 69 73  DLCO corrected ml/min/mmHg 9.05 - 11.37 10.42 - - 14.06  DLCO COR %Predicted % 54 - 66 60 - - 69  DLVA Predicted % 97 85 101 106 99 98 94  TLC L - - - - - - 3.63  TLC % Predicted % - - - - - - 78  RV % Predicted % - - - - - - 61       has a past medical history of Acute asthmatic bronchitis, Allergic rhinitis, Anxiety, Arthritis, Esophageal reflux, Hypertension, Interstitial lung disease ([place]) (dx jan  2020), Irritable bowel syndrome, Oxygen dependent, PONV (postoperative nausea and vomiting), Rheumatoid arthritis(714.0), and Sleep apnea.   reports that she has never smoked. She has never used smokeless tobacco.  Past Surgical History:  Procedure Laterality Date  .  c secttion  1977  . 2 foot fusions Left    total of 6 left foot sx  . ABDOMINAL HYSTERECTOMY     complete   . ANKLE FUSION  2009   left  . BACK SURGERY     lower l to l 5 fused  . CHOLECYSTECTOMY    . RIGHT/LEFT HEART CATH AND CORONARY ANGIOGRAPHY N/A 09/16/2016   Procedure: Right/Left Heart Cath and Coronary Angiography;  Surgeon: Nelva Bush, MD;  Location: [facility];  Service: Cardiovascular;  Laterality: N/A;  . TONSILLECTOMY    . TOTAL HIP ARTHROPLASTY Right 12/20/2018   Procedure: TOTAL HIP ARTHROPLASTY ANTERIOR APPROACH;  Surgeon: Paralee Cancel, MD;  Location: [facility];  Service: Orthopedics;  Laterality: Right;  70 mins  . TOTAL KNEE ARTHROPLASTY Bilateral     Allergies  Allergen Reactions  . Cefdinir Diarrhea  . Erythromycin Base Diarrhea    Other  . Lactose     Diarrhea    . Lactose Intolerance (Gi) Diarrhea  . Lactulose Diarrhea  . Mesalamine Nausea Only  . Methotrexate Other (See Comments)    unknown  . Nitrofuran Derivatives     shakiness  . Nitrofurantoin Other (See Comments)    shakiness other  . Other Diarrhea and Other (See Comments)    Shaking uncontrollably "lettuce only" "lettuce only"  . Sulfa Antibiotics Other (See Comments)    Patient can't recall reaction   . Tdap [Tetanus-Diphth-Acell Pertussis]     Shaking uncontrollably  . Tetanus Toxoid, Adsorbed Other (See Comments)    Shaking uncontrollable     Immunization History  Administered Date(s) Administered  . Fluad Quad(high Dose 65+) 03/15/2019  . Influenza Split 03/18/2012, 03/18/2013, 04/14/2014, 04/17/2017  . Influenza Whole 04/30/2008, 04/20/2009, 04/18/2011  . Influenza, High Dose Seasonal PF 04/23/2015,  03/24/2016,  04/06/2018, 04/02/2020  . Influenza,inj,quad, With Preservative 04/06/2018  . PFIZER(Purple Top)SARS-COV-2 Vaccination 08/24/2019, 09/18/2019, 04/13/2020  . Pneumococcal Conjugate-13 11/11/2013  . Pneumococcal Polysaccharide-23 05/21/2018  . Zoster Recombinat (Shingrix) 05/05/2017    Family History  Problem Relation Age of Onset  . Heart disease Mother   . Arthritis Mother   . Heart attack Father   . Diabetes Other        sibling  . Heart attack Other        sibling     Current Outpatient Medications:  .  albuterol (VENTOLIN HFA) 108 (90 Base) MCG/ACT inhaler, USE 2 INHALATIONS BY MOUTH  EVERY 6 HOURS AS NEEDED (Patient taking differently: Inhale 2 puffs into the lungs every 6 (six) hours as needed for wheezing or shortness of breath.), Disp: 34 g, Rfl: 1 .  aspirin EC 81 MG tablet, Take 81 mg by mouth daily., Disp: , Rfl:  .  benzonatate (TESSALON) 200 MG capsule, TAKE 1 CAPSULE BY MOUTH 3 TIMES A DAY AS NEEDED FOR COUGH (Patient taking differently: Take 200 mg by mouth 3 (three) times daily as needed for cough.), Disp: 30 capsule, Rfl: 3 .  celecoxib (CELEBREX) 200 MG capsule, Take 200 mg by mouth daily., Disp: , Rfl:  .  cetirizine (ZYRTEC) 10 MG tablet, Take 10 mg by mouth daily., Disp: , Rfl:  .  clidinium-chlordiazePOXIDE (LIBRAX) 5-2.5 MG capsule, Take 1 capsule by mouth 3 (three) times daily as needed (for IBS)., Disp: , Rfl:  .  colestipol (COLESTID) 1 g tablet, Take 2 g by mouth daily., Disp: , Rfl:  .  dicyclomine (BENTYL) 10 MG capsule, Take 10 mg by mouth 4 (four) times daily -  before meals and at bedtime., Disp: , Rfl:  .  FLUoxetine (PROZAC) 20 MG capsule, Take 20 mg by mouth daily., Disp: , Rfl:  .  fluticasone (FLONASE) 50 MCG/ACT nasal spray, SPRAY 2 SPRAYS INTO EACH NOSTRIL EVERY DAY (Patient taking differently: Place 2 sprays into both nostrils daily.), Disp: 48 mL, Rfl: 3 .  Fluticasone-Umeclidin-Vilant (TRELEGY ELLIPTA) 100-62.5-25 MCG/INH AEPB,  Inhale 1 puff into the lungs at bedtime., Disp: 180 each, Rfl: 2 .  furosemide (LASIX) 40 MG tablet, Take 40 mg by mouth daily., Disp: , Rfl:  .  HYDROcodone-acetaminophen (NORCO) 7.5-325 MG tablet, Take 1 tablet by mouth 2 (two) times daily as needed for pain., Disp: , Rfl:  .  hydroxychloroquine (PLAQUENIL) 200 MG tablet, Take 2 tablets (400 mg total) by mouth daily. Stop for 2 weeks, post-op. (Patient taking differently: Take 400 mg by mouth daily.), Disp: , Rfl:  .  ibuprofen (ADVIL) 200 MG tablet, Take 400 mg by mouth 2 (two) times daily., Disp: , Rfl:  .  ipratropium (ATROVENT) 0.03 % nasal spray, USE 1 TO 2 SPRAYS IN BOTH  NOSTRILS TWICE DAILY (Patient taking differently: Place 2 sprays into both nostrils 2 (two) times daily.), Disp: 90 mL, Rfl: 3 .  ipratropium-albuterol (DUONEB) 0.5-2.5 (3) MG/3ML SOLN, USE 1 VIAL IN NEBULIZER EVERY 6 HOURS AS NEEDED *J45.901* npi [id number] (Patient taking differently: Inhale 3 mLs into the lungs every 6 (six) hours as needed (Shortness of breath). *J45.901* npi [id number]), Disp: 270 mL, Rfl: 11 .  KLOR-CON M20 20 MEQ tablet, TAKE 1 TABLET BY MOUTH TWICE A DAY, Disp: 180 tablet, Rfl: 3 .  leflunomide (ARAVA) 20 MG tablet, Take 20 mg by mouth daily. , Disp: , Rfl:  .  methocarbamol (ROBAXIN) 750 MG tablet, Take 750 mg by mouth at  bedtime as needed for muscle spasms., Disp: , Rfl:  .  montelukast (SINGULAIR) 10 MG tablet, TAKE 1 TABLET BY MOUTH AT  BEDTIME (Patient taking differently: Take 10 mg by mouth at bedtime.), Disp: 90 tablet, Rfl: 3 .  Multiple Vitamin (MULTIVITAMIN) tablet, Take 1 tablet by mouth daily., Disp: , Rfl:  .  omeprazole (PRILOSEC) 40 MG capsule, Take 40 mg by mouth at bedtime. , Disp: , Rfl:  .  OXYGEN, Inhale 4 mLs into the lungs continuous. , Disp: , Rfl:  .  Pirfenidone (ESBRIET) 801 MG TABS, Take 801 mg by mouth 3 (three) times daily., Disp: 90 tablet, Rfl: 11 .  predniSONE (DELTASONE) 5 MG tablet, Take 5 mg by mouth daily with  breakfast., Disp: , Rfl:  .  promethazine-codeine (PHENERGAN WITH CODEINE) 6.25-10 MG/5ML syrup, Take 5 mLs by mouth every 6 (six) hours as needed for cough., Disp: 200 mL, Rfl: 0 .  rosuvastatin (CRESTOR) 20 MG tablet, Take 1 tablet (20 mg total) by mouth daily., Disp: 90 tablet, Rfl: 3 .  traMADol (ULTRAM) 50 MG tablet, Take 50 mg by mouth 3 (three) times daily as needed., Disp: , Rfl:  .  verapamil (CALAN-SR) 240 MG CR tablet, Take 1 tablet (240 mg total) by mouth daily., Disp: 90 tablet, Rfl: 3      Objective:   Vitals:   10/15/20 1055  BP: 120/62  Pulse: 78  Temp: 97.9 F (36.6 C)  TempSrc: Oral  SpO2: (!) 87%  Weight: 196 lb 1.6 oz (89 kg)  Height: 5' (1.524 m)    Estimated body mass index is 38.3 kg/m as calculated from the following:   Height as of this encounter: 5' (1.524 m).   Weight as of this encounter: 196 lb 1.6 oz (89 kg).  _0 @  Filed Weights   10/15/20 1055  Weight: 196 lb 1.6 oz (89 kg)     Physical Exam General: No distress. Morbidly obese Neuro: Alert and Oriented x 3. GCS 15. Speech normal Psych: Pleasant Resp:  Barrel Chest - no.  Wheeze - no, Crackles - mayb, No overt respiratory distress CVS: Normal heart sounds. Murmurs - n Ext: Stigmata of Connective Tissue Disease - yes RA HEENT: Normal upper airway. PEERL +. No post nasal drip        Assessment:       ICD-10-CM   1. ILD (interstitial lung disease) ([place])  J84.9   2. Research subject  Z00.6   3. Obstructive sleep apnea  G47.33   4. Chronic respiratory failure with hypoxia (HCC)  J96.11   5. High risk medication use  Z79.899        Plan:     Patient Instructions  Chronic respiratory failure with hypoxia (HCC) Interstitial lung disease due to connective tissue disease (HCC) High risk medication use Therapeutic drug monitoring REsearch subject - Pulse iNO study - randomized 3/29/222  -Cointued Progressive interstitial lung disease.  Currently on maximal  therapy - Plan ---Continue using oxygen 4 L at rest and 5-o 8 L with exertion  -Monitor pulse ox with exertion and keep it over 86% -Continue participation in ILD-pro research registry -Continue pirfenidone =Continue iNO pulsed device per study protocol  Obstructive sleep apnea  Plan -Continue CPAP  Acute sinusitis - new x 4 days symptms  Take doxycycline 179m po twice daily x 5 days; take after meals and avoid sunlight Please take prednisone 40 mg x1 day, then 30 mg x1 day, then 20 mg x1 day, then 10  mg x1 day, and then 5 mg baseline to continue   Asthma - trelegy might be too strong for you or excessive  Plan  - could try symbicort 80/4,5 2 puff twice daily  - use alb as needed  Followup  - research visit per protocol  - make Standard of care visit in 6 months - 30 min - we can cancel v keep it if needed      SIGNATURE    Dr. Brand Males, M.D., F.C.C.P,  Pulmonary and Critical Care Medicine Staff Physician, [place] Director - Interstitial Lung Disease  Program  [facility] [place], [place], [address]  Pager: [phone], If no answer or between  15:00h - 7:00h: call [phone] Telephone: [phone]  11:39 AM 10/15/2020

## 2020-10-15 NOTE — H&P (View-Only) (Signed)
05/21/2018- 75 year old female never smoker followed for allergic rhinitis, rhinosinusitis, asthmatic bronchitis, ILD, OSA,complicated by GERD/Crohn's, Rheumatoid arthritis/prednisone -----4 month follow up for asthma and OSA. Per patient she is still having issues with SOB. She has had 2 foot surgeries since her last visit. States that the [name] is working but [name] wanted to switch her to something else. Unsure of the medication name and I did not see it in his last OV.  Singulair, and Anoro, neb duoneb CPAP auto 5-15/Advanced   Download 100% compliance AHI 1.3/hour. I discussed FENO and she will try Symbicort. Discussed inhaled steroids.  We need to get her a new AeroChamber. Has been on sustained antibiotics for failed appliance in her foot. Notices some runny nose and scant bland mucus.  Wheezing has been well controlled.  Little cough.  She and her husband recognize inability to get any exercise as a substantial reason for dyspnea.  No acute events.  Cardiology continues to follow, including her pulmonary hypertension.  We reviewed her most recent chest CT and discussed implications of progressive interstitial fibrosis.  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  I would like to refer her to [name] for his opinion and advice. FENO 03/15/18  49 H CT chest Hi Res 02/02/2018- IMPRESSION: 1. Spectrum of findings compatible with fibrotic interstitial lung disease with a slight basilar predominance, with mild progression since 2017. 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 2. Mild patchy air trapping, unchanged, indicative of small airways disease. 3. Left main and two-vessel coronary atherosclerosis. 4. Stable dilated main pulmonary artery, suggesting chronic pulmonary arterial hypertension. Aortic Atherosclerosis (ICD10-I70.0).  06/08/18-  75 year old female never smoker followed for allergic rhinitis, rhinosinusitis, asthmatic bronchitis, ILD, OSA,complicated by GERD/Crohn's, Rheumatoid arthritis/prednisone -----coughing for 2 weeks, yellow, green mucus, SOB  Singulair, Atrovent nasal spray, Symbicort 160, and Anoro, neb albuterol, neb DuoNeb, Complains of increased cough with chest congestion, green/yellow sputum, low-grade fever 99 degrees over the last 2 weeks.  Not on prednisone now.  Last antibiotic in October. Pending ILD consultation with [name] in December.   07/02/2018  - Visit   75 year old female patient presenting today for acute visit.  Patient was recently treated with a course of Augmentin and reports sputum color is now light green but has decreased in sputum amount since last office visit.  Patient does not feel she is getting better.  Patient remains adherent to Anoro Ellipta.  On arrival to our office today patient was short of breath and oxygen saturations were 81%.  With deep breathing patient was able to increase oxygenation to 85%.  When placed on 2 L O2 patient was able to maintain at 91%.  Patient had previously been on oxygen in 2015 but this was later [facility].  Patient's weight has also increased over the previous office visits, patient remains adherent to diuretic treatment plan as on taking 1 diuretic daily.  Patient reports she is allowed to take an additional diuretic when she feels necessary.  Unfortunately patient has not been weighing herself regularly there is no set plan to when she should take her second diuretic.  Patient is exceptionally sedentary at home and rarely gets out of her bed or lazy boy chair which is about 3 feet from her bed.  Patient's husband takes care of patient completely including getting drinks and food so patient does not have to leave her lazy boy  recliner.  Patient watches many television shows and does not report much activity.   Patient has completed follow-up with  rheumatology last week and no changes to her medications were made.  Patient reports that she was stable at the time.  CPAP compliance report showing 30 out of 30 days used.  All 30 those days greater than 4 hours.  APAP settings 5-15.  Average usage days 7 hours and 56 minutes.  AHI 0.8.    OV 07/31/2018 -referred to interstitial lung disease clinic  Subjective:  Patient ID: [name], female , DOB: 1946/04/21 , age 53 y.o. , MRN: [id number] , ADDRESS: [address] [place] [address]   07/31/2018 -   Chief Complaint  Patient presents with  . Follow-up    [name] that stated to be referred to ILD clinic.  Pt currently has complaints of wheezing which she has had since December 2019, worsening SOB when moving around and also states she has a pain in left side of chest near shoulder. Pt wears between 2-4L O2 but mainly has been wearing 4L at home.      HPI HAYLE PARISI 75 y.o. -accompanied by her husband.  Referred by [name] pulmonologist in our practice for evaluation of interstitial lung disease in the setting of rheumatoid arthritis.  History is gained from her, talking to her husband, review of recent office visits and also the interstitial lung disease questionnaire.  As best as I can gather   [facility] ILD Questionnaire  Symptoms: She is known to have ILD on a CT chest in 2017 but she is not aware of it.  She says that she follows normally for sleep apnea and asthma with [name].  She recollects that approximately 11 or 12 years ago she was hospitalized for pneumonia and was on oxygen for short while not otherwise specified.  Then came off oxygen.  After that overall she is been stable and sedentary using her CPAP.  She is mostly sedentary because of obesity, rheumatoid arthritis and also because of the nonhealing ulcer in her feet for which she is been advised sedentary lifestyle to enable healing.  Most recently to  Thanksgiving 2019 she was seen for respiratory exacerbation and given antibiotics and prednisone.  She followed up mid December 2019 and was found to have new onset hypoxemia 85% on room air.  She was then discharged on portable oxygen which she is using 4 L nasal cannula at rest.  She tells me that at home when she comes off of CPAP she is in her 24s on room air at rest.  In fact in the office today she is 83% on room air at rest but 93% on 2 L nasal cannula at rest.  There is concerned that she has worsening ILD particularly summer 2019 on her CT chest had progressive findings compared to 2 years earlier.  The presumption is that this ILD is caused by rheumatoid arthritis.  In the 2019 CT chest CT is reported as probable UIP but in my personal visualization is either indeterminate or alternative diagnosis.  In terms of symptoms: She reports insidious onset of shortness of breath for some years and gradually getting worse.  Level 2 dyspnea at rest.  Level for dyspnea taking a shower.  Level 5 dyspnea walking at her own pace of walking with others of her age and walking up stairs or walking up a hill.  She does have  a cough with yellow and green sputum.  She does cough at night.  There is also wheezing  Past Medical History : She has longstanding history of rheumatoid arthritis since the 1980s.  Used to be followed by Dr. Danie Binder.  As best as I can gather she used to be on methotrexate maybe 10 or 20 years ago.  She took it for a short while and then stopped it because of diarrhea.  She took this for less than 1 year.  She believes that after that she was basically on pain management.  She is only been on prednisone chronic intake for a total of 1 year approximately 4 to 5 years ago.  Around this time Dr. Hoyt Koch retired and she started seeing Dr. Gavin Pound.  Since then she is been on Arava and Plaquenil.  She has never seen any other immunomodulators in the setting of rheumatoid arthritis  according to history.  Overall the rheumatoid arthritis has caused some disability with deformed joints in her hands and also her using cane.    She is not on any anticoagulation is never had any blood clots.  In 2017 she had vascular extremity duplex venous that was negative for DVT.  Most recently she is been dealing with nonhealing ulcer of the left toe/metatarsal .  She had August and September 2018.  And then 1 in the latter part of 2019.  Most recently she says she has had a duplex lower extremity venous.  I do not have access to these results.  She is waiting on this.  Was done in the last few to several days.   She also has a longstanding history of asthma for which she is on Singulair.  Exam nitric oxide was elevated in August 2019 at 49 ppb but 07/31/2018 I snormal in the 20s though she is wheezing   She has nonobstructive coronary artery disease and in March 2018 had a right heart catheterization with elevated pulmonary pressures but also slightly elevated wedge pressure [see below].  She is started on Lasix  She has sleep apnea for which she uses CPAP.  She has obesity  ROS: Positive for arthralgia and arthritis.  Dry eyes and dry mouth but otherwise negative   FAMILY HISTORY of LUNG DISEASE: Denies   EXPOSURE HISTORY: Never smoked any cigarettes, marijuana, vaping, cocaine or intravenous drug use   HOME and HOBBY DETAILS : [place] home [place] lived [place] for 8 years.  No mold.  No dampness.  Does use humidifier and does use CPAP but there is no mold in it.  Also uses nebulizer machine but there is no mold.  No pet birds no musical instruments no guarding habits   OCCUPATIONAL HISTORY (122 questions) : Denies   PULMONARY TOXICITY HISTORY (27 items): She has taken nitrofurantoin 9 years ago.  Has not taken methotrexate for a year 5 years ago and is taken sulfasalazine       Results for SOPHYA, VANBLARCOM (MRN [id number]) as of 07/31/2018 16:31  Ref. Range  09/03/2015 12:45 02/21/2017  FVC-Pre Latest Units: L 2.18 1.7L  FVC-%Pred-Pre Latest Units: % 84 66%   Results for JANIRA, MANDELL (MRN [id number]) as of 07/31/2018 16:31  Ref. Range 09/03/2015 12:45  DLCO cor Latest Units: ml/min/mmHg 14.06  DLCO cor % pred Latest Units: % 69   HEART CATH MARCH 2018 Right Heart Pressures RA (mean): 13 mmHg RV (S/EDP): 38/12 mmHg PA (S/D, mean): 43/23 (32) mmHg PCWP (mean): 16 mmHg  Conclusions: 1. Mild to moderate, non-obstructive coronary artery disease with 50% mid LAD and 20% proximal RCA stenoses. 2. Mild to moderate pulmonary hypertension with mildly elevated right heart filling pressures. 3. Upper normal left heart filling pressures. 4. Normal Fick cardiac output/index. 5. Equalization of end-diastolic pressures with ventricular concordance. This can be seen in the setting of restrictive process.  Recommendations: 1. Escalate statin therapy to prevent progression of CAD. 2. Increase furosemide to 40 mg BID and KCl to 40 mg BID. Patient to have BMP in 1 week to evaluate renal function and potassium. 3. Continue outpatient pulmonary follow-up. I suspect underlying lung disease is the driving force behind the patient's dyspnea, pulmonary hypertension, and elevated right heart pressures. 4. Outpatient follow-up in cardiology clinic in ~6 weeks.  Nelva Bush, MD   CT chest high res July 2019 - visualized personally - personaly opinion - indeterminate or alt dx for UIP but agree with progression  IMPRESSION: Lungs/Pleura: No pneumothorax. No pleural effusion. No acute consolidative airspace disease or lung masses. Few scattered small solid pulmonary nodules in mid to upper right lung, largest 4 mm in the right upper lobe (series 3/image 46), unchanged since 03/14/2016 CT, considered benign. No new significant pulmonary nodules. Mild patchy air trapping in both lungs on the expiration sequence, unchanged. Patchy peribronchovascular and  subpleural reticulation and ground-glass attenuation throughout both lungs with associated mild traction bronchiectasis and mild architectural distortion. There is a slight basilar gradient to these findings. Tiny focus of honeycombing in anterior right upper lobe (series 8/image 59). These findings have mildly worsened since 03/14/2016 chest CT. 1. Spectrum of findings compatible with fibrotic interstitial lung disease with a slight basilar predominance, with mild progression since 2017. 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 2. Mild patchy air trapping, unchanged, indicative of small airways disease. 3. Left main and two-vessel coronary atherosclerosis. 4. Stable dilated main pulmonary artery, suggesting chronic pulmonary arterial hypertension.  Aortic Atherosclerosis (ICD10-I70.0).   Electronically Signed   By: Ilona Sorrel M.D.   On: 02/02/2018 14:03        OV 03/15/2019  Subjective:  Patient ID: [name], female , DOB: November 12, 1945 , age 75 y.o. , MRN: [id number] , ADDRESS: [address] [place] [address]   03/15/2019 -   Chief Complaint  Patient presents with  . IPD (Interstitial pulmonary disease)    Breathing is doing better since being on Esbriet. Has productive cough with green phelgm. Has been on antibiotic for last 6 weeks. Had hip replacement in June.     HPI TUYEN UNCAPHER 75 y.o. 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  Started on Esbriet in March 2020.  Has progressive phenotype.   She presents for routine follow-up with her husband.  She tells me that ever since starting Esbriet in the spring 2020 she has been doing well.  She does not feel that the disease has progressed.  In fact she feels somewhat better.  She continues to use 4 L of oxygen.  She is now taking the Esbriet at 1 pill  3 times daily.  This is the full dose of 1 pill.  Her last pulmonary function test was in early 2020.  It has been a while since she did her liver function test and she requires a repeat of that today.  She is willing to have her flu shot today.  She is  asking for prescription for emergency electric generator.  She does not know about the [employer] energy assistance program.  She has a handicap sticker.  She is interested in the ILD-PRO research registry study.    She is on Trelegy inhaler and Singulair I am not fully clear why   In June 2020 she had hip replacement for stress fracture and was on 6 weeks of Keflex.  She is slowly improving.  She uses a walker.  OV 05/17/2019  Subjective:  Patient ID: [name], female , DOB: [birth year] , age 84 y.o. , MRN: [id number] , AD[address]ESS: [address] [place] [address]   05/17/2019 -   Chief Complaint  Patient presents with  . Follow-up    Patient reports that she has had a productive cough with yellow sputum and her PCP placed her on an antibiotic.    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  Started on Esbriet in March 2020.  Has progressive phenotype. COVID negative 04/25/2019,. On 02 4L [place] since end 2019  Also hx of asthma  Also hx of chronic sinusitis - 2017 CT maxillary sinusits - DR Wilburn Cornelia  History of sleep apnea on CPAP  HPI EMORI MUMME 75 y.o. -presents to the ILD clinic for the above issues.  She continues on pirfenidone since March 2020.  She says this is helping her.  No adverse effects.  Last liver function test was August 2020.  She needs another liver function test right now.  No nausea vomiting or diarrhea or weight loss.  She continues on 4 L of oxygen continuously 24/7.  At night she uses CPAP for her sleep apnea.  She and her husband think that she needs 6 L of oxygen at night and they are trying to increase the oxygen.  Although this is not based on data but on  subjective reasoning.    New issue associated with sinus issues: Her main issue today is that for the last 3-week she has had worsening of her cough and there is associated yellow sputum.  Her COVID-19 test was -2 days ago.  This been going on for 3 weeks.  She tells me this happens to her every winter.  She is complaining of chronic postnasal drainage.  In 2017 she had a CT scan of the sinus that showed maxillary sinusitis and saw Dr. Wilburn Cornelia but since then has not followed up.  She does not have a history of sinus surgery.  She is frustrated by the cough and the yellow sputum.  Her pulmonary function test itself is stable since she started the [place].  She is interested in the ILD pro registry study.  In terms of her asthma: She is on Trelegy and Singulair.  She does not want to stop this.  There is a clinical history.  She says that if she stops this it gets worse.   In terms of her sleep apnea: She continues on CPAP with oxygen at night.    ROS    SYMPTOM SCALE - ILD 03/15/2019  05/17/2019   O2 use x   Shortness of Breath 0 -> 5 scale with 5 being worst (score 6 If unable to do)   At rest 0 0  Simple tasks - showers, clothes change, eating, shaving 0 0  Household (dishes, doing bed, laundry) 5 6  Shopping 5 6  Walking level at own pace 3 2  Walking keeping up with others of same age 28  3  Walking up Stairs 5 3  Walking up Hill 5 6  Total (40 - 48) Dyspnea Score 25 26  How bad is your cough? 2 3  How bad is your fatigue 5 5    Simple office walk 185 feet x  3 laps goal with forehead probe 03/15/2019 - uses 4L [place] at home   O2 used Room air  Number laps completed 1/2 lap ad desat to 88%  Comments about pace   Resting Pulse Ox/HR   Final Pulse Ox/HR   Desaturated </= 88%   Desaturated <= 3% points   Got Tachycardic >/= 90/min   Symptoms at end of test   Miscellaneous comments -walked with 4L [place] - uses 4L [place] at home     Results for MYRENE, BOUGHER (MRN [id number]) as of  05/17/2019 10:56  Ref. Range 09/03/2015 12:45 10/01/2018 14:45 05/17/2019 08:59  FVC-Pre Latest Units: L 2.18 1.81 1.82  FVC-%Pred-Pre Latest Units: % 84 72 74  Results for DEBHORA, TITUS (MRN [id number]) as of 05/17/2019 10:56  Ref. Range 09/03/2015 12:45 10/01/2018 14:45 05/17/2019 08:59  DLCO unc Latest Units: ml/min/mmHg 14.82 11.94 11.28  DLCO unc % pred Latest Units: % 73 69 65    IMPRESSION: CT chest Jan  2020 1. Severe tracheobronchomalacia, more pronounced on today's scan. 2. Interval progression of basilar predominant fibrotic interstitial lung disease with particularly increased ground-glass component. No honeycombing. 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 Findings are consistent with UIP per consensus guidelines: Diagnosis of Idiopathic Pulmonary Fibrosis: An Official ATS/ERS/JRS/ALAT Clinical Practice Guideline. [place], Iss 5, [phone], Mar 18 2017. 3. Stable dilated main pulmonary artery, suggesting chronic pulmonary arterial hypertension. 4. Left main and 3 vessel coronary atherosclerosis.  Aortic Atherosclerosis (ICD10-I70.0).   Electronically Signed   By: Ilona Sorrel M.D.   On: 08/20/2018 08:21   - per HPI  OV 04/02/2020  Subjective:  Patient ID: [name], female , DOB: [birth year] , age 87 y.o. 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  Started on Esbriet in March 2020.  Has progressive phenotype. COVID negative 04/25/2019,. On 02 4L [place] since end 2019  - on ild pro registry  Also hx of asthma -Singulair and inhaler  Also hx of chronic sinusitis - 2017 CT maxillary sinusits - DR Wilburn Cornelia  History of sleep apnea on CPAP  Rheumatoid arthritis seen by Dr. Gavin Pound on leflunomide, Plaquenil and daily prednisone 5  mg/day.  04/02/2020 -   Chief Complaint  Patient presents with  . Follow-up    pt is here to go over pft results     HPI TEDDIE CURD 75 y.o. -presents for the above issues of interstitial lung disease in the setting of connective tissue disease.  She is on leflunomide and Plaquenil and prednisone with her rheumatologist.  She is on pirfenidone for her ILD.  I personally last saw her almost a year ago in October 2020.  After that she is seen nurse practitioner.  In the interim she says she is overall stable with the symptom score show a different story she has had significant worsening of her symptoms.  She continues to be morbidly obese.  Walking desaturation test she is desaturating to the point she is requiring 6 L to correct.  In fact  early in August 2020 [place] and was extremely short of breath.  Even at rest on 3 L she was 89%.  She continues to use a CPAP.  Her husband who is here with her today would like her to lose weight in order to help her health status.  Pulmonary function test shows also a decline.  Her last CT scan of the chest was in June 2020    [place] 05/13/2020   Subjective:  Patient ID: [name], female , DOB: March 19, 1946, age 72 y.o. years. 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    - Started on Esbriet in March 2020.  Has progressive phenotype. COVID negative 04/25/2019,. On 02 4L [place] since end 2019  - on ild pro registry  Also hx of asthma -Singulair and inhaler  Also hx of chronic sinusitis - 2017 CT maxillary  sinusits - DR Wilburn Cornelia  History of sleep apnea on CPAP  Rheumatoid arthritis seen by Dr. Gavin Pound on leflunomide, Plaquenil and daily prednisone 5 mg/day.  Elevated pulmonary artery pressures but PVR less than 3 in 2018: Right Heart Pressures RA (mean): 13 mmHg RV (S/EDP): 38/12 mmHg PA (S/D, mean): 43/23 (32) mmHg PCWP (mean): 16 mmHg  Ao sat: 94% PA sat: 70% RA sat: 75%  Fick CO: 6.4 L/min Fick CI: 3.7 L/min/m^2  PVR: 2.5 [place] units     Chief Complaint  Patient presents with  . Follow-up    more sob walking shorter distances.  coughing up yellow phlegm.       HPI NIKHITA MENTZEL 75 y.o. -last visit September 2021.  At that time concern for progressive ILD.  Therefore we decided to PFTs and also high-resolution CT chest.  The high-resolution CT chest compared to last year shows stability.  The PFTs show stability within this year but progression compared to last year.  She is also lost weight following a low carbohydrate diet.  Nevertheless she feels the weight of her symptoms.  She is also using oxygen with exertion.  She feels this is getting worse.  Although her overall symptom burden seems to be improved with the weight loss.  Early morning she says there are some yellow phlegm which is stable for many many years.  This is mostly sinus.  There is no change in this.  She continues on immunosuppressants and antifibrotic.  There is no side effects from these.  She is willing to have labs checked.  She has had a Covid vaccine including booster.  She does not know if she has antibody response yet.  She is willing to get this checked.  She is looking at options to get better.  She will participate in the ILD-pro registry visit today.  Review of the records indicate the last right eye catheterization was in 2018.  Pressures were borderline at that time.  She is willing to get this checked.  Her cardiologist is in [place].  She wants to get the procedure done in [place] and  is willing to see Dr. Haroldine Laws or Dr. Aundra Dubin who are well versed with right heart catheterization locally.  Wt Readings from Last 3 Encounters:  05/13/20 202 lb 12.8 oz (92 kg)  04/02/20 207 lb 9.6 oz (94.2 kg)  02/05/20 218 lb (98.9 kg)      PFT Results Latest Ref Rng & Units 04/01/2020 10/02/2019 05/17/2019 10/01/2018 09/03/2015  FVC-Pre L 1.60 1.62 1.82 1.81 2.18  FVC-Predicted Pre % 65 66 74 72 84  FVC-Post L - - - - 2.17  FVC-Predicted Post % - - - - 83  Pre FEV1/FVC % % 84 83 83 75 79  Post FEV1/FCV % % - - - - 83  FEV1-Pre L 1.35 1.34 1.51 1.36 1.73  FEV1-Predicted Pre % 73 73 82 73 88  FEV1-Post L - - - - 1.80  DLCO uncorrected ml/min/mmHg 11.37 10.42 11.28 11.94 14.82  DLCO UNC% % 66 60 65 69 73  DLCO corrected ml/min/mmHg 11.37 10.42 - - 14.06  DLCO COR %Predicted % 66 60 - - 69  DLVA Predicted % 101 106 99 98 94  TLC L - - - - 3.63  TLC % Predicted % - - - - 78  RV % Predicted % - - - - 61    TECHNIQUE: CT chest sept 2021 Multidetector CT imaging of the chest was performed following the standard protocol without intravenous contrast. High resolution imaging of the lungs, as well as inspiratory and expiratory imaging, was performed.  COMPARISON:  08/16/2018, 02/02/2018, 03/14/2016, 08/27/2015  FINDINGS: Cardiovascular: Aortic atherosclerosis. Normal heart size. Three-vessel coronary artery calcifications. No pericardial effusion.  Mediastinum/Nodes: No enlarged mediastinal, hilar, or axillary lymph nodes. Thyroid gland, trachea, and esophagus demonstrate no significant findings.  Lungs/Pleura: Unchanged moderate pulmonary fibrosis in a pattern with apical to basal gradient featuring irregular peripheral interstitial opacity, septal thickening, ground-glass, and minimal tubular bronchiectasis without evidence of bronchiolectasis or honeycombing. Fibrotic findings are not significantly changed compared to immediate prior examination but significantly  worsened over time on examinations dating back to 08/27/2015 occasional stable, definitively benign small pulmonary nodules, for example a 3 mm nodule of the anterior right upper lobe (series 10, image 77). No significant air trapping on expiratory phase imaging. No pleural effusion or pneumothorax.  Upper Abdomen: No acute abnormality.  Musculoskeletal: No chest wall mass or suspicious bone lesions identified.  IMPRESSION: 1. Unchanged moderate pulmonary fibrosis in a pattern with apical to basal gradient featuring irregular peripheral interstitial opacity, septal thickening, ground-glass, and minimal tubular bronchiectasis without evidence of bronchiolectasis or honeycombing. Fibrotic findings are not significantly changed compared to immediate prior examination but significantly worsened over time on examinations dating back to occasional 08/27/2015. 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 2. Coronary artery disease.  Aortic Atherosclerosis (ICD10-I70.0).   Electronically Signed   By: Eddie Candle M.D.   On: 04/15/2020 15:35    OV 08/19/2020  Subjective:  Patient ID: [name], female , DOB: 1945/10/10 , age 66 y.o. , MRN: [id number] , ADDRESS: [address] [place] [address] PCP Lorene Dy, MD Patient Care Team: Lorene Dy, MD as PCP - General (Internal Medicine) End, Harrell Gave, MD as PCP - Cardiology (Cardiology) Gavin Pound, MD as Consulting Physician (Rheumatology)  This Provider for this visit: Treatment Team:  Attending Provider: Brand Males, MD    08/19/2020 -   Chief Complaint  Patient presents with  . 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    -  Started on Esbriet in March 2020.  Has progressive phenotype. COVID negative 04/25/2019,. On 02 4L [place] since end 2019  - on ild pro registry  Also hx of asthma -Singulair and inhaler  Also hx of chronic sinusitis - 2017 CT maxillary sinusits - DR Wilburn Cornelia  History of sleep apnea on CPAP  Rheumatoid arthritis seen by Dr. Gavin Pound on leflunomide, Plaquenil and daily prednisone 5 mg/day.    HPI BERNELL HAYNIE 75 y.o. -presents with her husband.  Last seen in October 2021.  After that in January 2022 she was supposed to have a right heart catheterization because of concern of progressive ILD and her having autoimmune phenotype.  However middle of January 2022 she got sick with bronchitis.  She and her husband say that she gets this sick every year.  They do not know if it was Covid because she did not get tested.  She says primary care physician treated her with 10 days of 20 mg/day prednisone [at baseline she is on 5 mg/day of].  And also doxycycline for 10 days.  This is not helped.  Therefore she is now on amoxicillin and a second course of prednisone 20 mg/day for 10 days.  Her baseline dose is 5 mg prednisone.  She is not feeling better.  In fact her symptom scores are much worse.  Her pulse ox on room air at rest was 88%.  Previously room air at rest was fine.  She is frustrated by her symptoms.  She and her husband are frustrated by the symptom burden.  She is requesting Phenergan with codeine for cough control.    PFT  PFT Results Latest Ref Rng & Units 04/01/2020 10/02/2019 05/17/2019 10/01/2018 09/03/2015  FVC-Pre L 1.60 1.62 1.82 1.81 2.18  FVC-Predicted Pre % 65 66 74 72 84  FVC-Post L - - - - 2.17  FVC-Predicted Post % - - - - 83  Pre FEV1/FVC % % 84 83 83 75 79  Post FEV1/FCV % % - - - - 83  FEV1-Pre L 1.35 1.34 1.51 1.36 1.73  FEV1-Predicted Pre % 73 73 82 73 88  FEV1-Post L - - - - 1.80  DLCO uncorrected ml/min/mmHg 11.37 10.42 11.28 11.94 14.82  DLCO UNC% % 66 60 65 69 73   DLCO corrected ml/min/mmHg 11.37 10.42 - - 14.06  DLCO COR %Predicted % 66 60 - - 69  DLVA Predicted % 101 106 99 98 94  TLC L - - - - 3.63  TLC % Predicted % - - - - 78  RV % Predicted % - - - - 61      OV 09/03/2020  Subjective:  Patient ID: [name], female , DOB: August 07, 1945 , age 27 y.o. , MRN: [id number] , ADDRESS: [address] [place] [address] PCP Lorene Dy, MD Patient Care Team: Lorene Dy, MD as PCP - General (Internal Medicine) End, Harrell Gave, MD as PCP - Cardiology (Cardiology) Gavin Pound, MD as Consulting Physician (Rheumatology)  This Provider for this visit: Treatment Team:  Attending Provider: Brand Males, MD  Type of visit: Telephone/Video Circumstance: COVID-19 national emergency Identification of patient ZYNIAH FERRAIOLO with 11/08/1945 and MRN [id number] - 2 person identifier Risks: Risks, benefits, limitations of telephone visit explained. Patient understood and verbalized agreement to proceed Anyone else on call: just patient Patient location: [phone] This provider location: [address]  09/03/2020 -  No chief complaint on file.   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    - Started on Esbriet in March 2020.  Has progressive phenotype. COVID negative 04/25/2019,. On 02 4L [place] since end 2019   - on ild pro registry  Also hx of asthma -Singulair and inhaler  Also hx of chronic sinusitis - 2017 CT maxillary sinusits - DR Wilburn Cornelia  History of sleep apnea on CPAP  Rheumatoid arthritis seen by Dr. Gavin Pound on leflunomide, Plaquenil and daily prednisone 5 mg/day.  Positive Covid IgG - Feb 2022   HPI GHADA ABBETT 75 y.o. -this telephone visit this telephone visit is to follow-up from recent exacerbation early February 2022.  She is finished her prednisone she is feeling back to baseline.  She says her sinuses are congested but she will follow-up with  the ENT.  She is still waiting for a right heart catheterization.  She has follow-up later in March 2022 with me.  Oxygen use is baseline.  She had Covid IgG checked and this was normal.  She continues to use a CPAP.  She is regular with all her medications.  She took consent form for PULSE inhaled nitric oxide study and she is interested.  Recent lab review shows slightly high calcium.  She does not know that she has this problem    PFT  PFT Results Latest Ref Rng & Units 04/01/2020 10/02/2019 05/17/2019 10/01/2018 09/03/2015  FVC-Pre L 1.60 1.62 1.82 1.81 2.18  FVC-Predicted Pre % 65 66 74 72 84  FVC-Post L - - - - 2.17  FVC-Predicted Post % - - - - 83  Pre FEV1/FVC % % 84 83 83 75 79  Post FEV1/FCV % % - - - - 83  FEV1-Pre L 1.35 1.34 1.51 1.36 1.73  FEV1-Predicted Pre % 73 73 82 73 88  FEV1-Post L - - - - 1.80  DLCO uncorrected ml/min/mmHg 11.37 10.42 11.28 11.94 14.82  DLCO UNC% % 66 60 65 69 73  DLCO corrected ml/min/mmHg 11.37 10.42 - - 14.06  DLCO COR %Predicted % 66 60 - - 69  DLVA Predicted % 101 106 99 98 94  TLC L - - - - 3.63  TLC % Predicted % - - - - 78  RV % Predicted % - - - - 61    OV 10/15/2020  Subjective:  Patient ID: [name], female , DOB: 06/15/1946 , age 1 y.o. , MRN: [id number] , ADDRESS: [address] [place] [address] PCP Lorene Dy, MD Patient Care Team: Lorene Dy, MD as PCP - General (Internal Medicine) End, Harrell Gave, MD as PCP - Cardiology (Cardiology) Gavin Pound, MD as Consulting Physician (Rheumatology)  This Provider for this visit: Treatment Team:  Attending Provider: Brand Males, MD    10/15/2020 -   Chief Complaint  Patient presents with  . Follow-up    She felt she was breathing deeper before pft.      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    - Started on Esbriet in March 2020.  Has progressive phenotype. COVID negative 04/25/2019,. On 02  4L [place] since end 2019   - on ild pro registry  - started PULSE iNO study march 2022 via study protocol -randomized trial  Also hx of asthma -Singulair and inhaler trelegy  Also hx of chronic sinusitis - 2017 CT maxillary sinusits - DR Wilburn Cornelia  History of sleep  apnea on CPAP  Rheumatoid arthritis seen by Dr. Gavin Pound on leflunomide, Plaquenil and daily prednisone 5 mg/day.  Positive Covid IgG - Feb 2022   HPI FAHIMA CIFELLI 75 y.o. -presents for standard of care visit.  She is now on inhaled nitric oxide study.  The inhaled nitric oxide is being bled through her oxygen.  She has to wear 12 hours a day.  She was randomized 2 days ago.  She tells me for the last 4 days she is having sinus complaints of yellow sinus drainage and increased cough.  She feels as acute sinusitis and she will benefit from antibiotics and prednisone.  Specifically she did not mention this at the time of randomization.  She denied this at the time of randomization but now she says it was present for 4 days.  She had pulmonary function test that shows continued decline in ILD.  Her oxygen use remains the same at 4 L and with exertion she uses 5 L.  She is on Trelegy for asthma.  Her CT scan of the chest in September did not show any evidence of emphysema.  She uses CPAP for her sleep apnea.  Labs from research were reviewed by me.  Her most recent hemoglobin is 12.  Creatinine and liver function test was normal.  Calcium is not available.   Of note after starting the inhaled nitric oxide she feels she can breathe more deeper.  However his symptom score shows continued decline.     SYMPTOM SCALE - ILD 03/15/2019  05/17/2019  04/02/2020  05/13/2020   08/19/2020  10/15/2020   O2 use x       Shortness of Breath 0 -> 5 scale with 5 being worst (score 6 If unable to do)       At rest 0 0 4 0 4 2  Simple tasks - showers, clothes change, eating, shaving 0 0 _0 Household (dishes, doing bed, laundry) _1 Shopping _2 Walking level at own pace _3 Walking up Stairs _4 Total (40 - 48) Dyspnea Score _5 How bad is your cough? _6 0 4 3  How bad is your fatigue _7 nauea   0 0 0 0  vomit   0 0 0 0  diarrhea   _8 anxiety   _9 depression   _10 Simple office walk 185 feet x  3 laps goal with forehead probe 03/15/2019 - uses 4L [place] at home  04/02/2020  05/13/2020 92% on RA, 4L [place] when walking in 91% 08/19/2020 Uses 4L [place] at rest. Was 88% o RA at rest  O2 used Room air Walked on 3L [place]    Number laps completed 1/2 lap ad desat to 88%     Comments about pace      Resting Pulse Ox/HR  97% and 75    Final Pulse Ox/HR  88% and 108 at 2nd laps    Desaturated </= 88%      Desaturated <= 3% points      Got Tachycardic >/= 90/min      Symptoms at end of test      Miscellaneous comments -walked with 4L [place] - uses  4L [place] at home Needed 6L [place] to correct        PFT  PFT Results Latest Ref Rng & Units 10/15/2020 09/11/2020 04/01/2020 10/02/2019 05/17/2019 10/01/2018 09/03/2015  FVC-Pre L 1.47 1.58 1.60 1.62 1.82 1.81 2.18  FVC-Predicted Pre % 63 65 65 66 74 72 84  FVC-Post L - - - - - - 2.17  FVC-Predicted Post % - - - - - - 83  Pre FEV1/FVC % % 83 80 84 83 83 75 79  Post FEV1/FCV % % - - - - - - 83  FEV1-Pre L 1.21 1.26 1.35 1.34 1.51 1.36 1.73  FEV1-Predicted Pre % 70 70 73 73 82 73 88  FEV1-Post L - - - - - - 1.80  DLCO uncorrected ml/min/mmHg 9.05 7.59 11.37 10.42 11.28 11.94 14.82  DLCO UNC% % 54 44 66 60 65 69 73  DLCO corrected ml/min/mmHg 9.05 - 11.37 10.42 - - 14.06  DLCO COR %Predicted % 54 - 66 60 - - 69  DLVA Predicted % 97 85 101 106 99 98 94  TLC L - - - - - - 3.63  TLC % Predicted % - - - - - - 78  RV % Predicted % - - - - - - 61       has a past medical history of Acute asthmatic bronchitis, Allergic rhinitis, Anxiety, Arthritis, Esophageal reflux, Hypertension, Interstitial lung disease ([place]) (dx jan  2020), Irritable bowel syndrome, Oxygen dependent, PONV (postoperative nausea and vomiting), Rheumatoid arthritis(714.0), and Sleep apnea.   reports that she has never smoked. She has never used smokeless tobacco.  Past Surgical History:  Procedure Laterality Date  .  c secttion  1977  . 2 foot fusions Left    total of 6 left foot sx  . ABDOMINAL HYSTERECTOMY     complete   . ANKLE FUSION  2009   left  . BACK SURGERY     lower l to l 5 fused  . CHOLECYSTECTOMY    . RIGHT/LEFT HEART CATH AND CORONARY ANGIOGRAPHY N/A 09/16/2016   Procedure: Right/Left Heart Cath and Coronary Angiography;  Surgeon: Nelva Bush, MD;  Location: [facility];  Service: Cardiovascular;  Laterality: N/A;  . TONSILLECTOMY    . TOTAL HIP ARTHROPLASTY Right 12/20/2018   Procedure: TOTAL HIP ARTHROPLASTY ANTERIOR APPROACH;  Surgeon: Paralee Cancel, MD;  Location: [facility];  Service: Orthopedics;  Laterality: Right;  70 mins  . TOTAL KNEE ARTHROPLASTY Bilateral     Allergies  Allergen Reactions  . Cefdinir Diarrhea  . Erythromycin Base Diarrhea    Other  . Lactose     Diarrhea    . Lactose Intolerance (Gi) Diarrhea  . Lactulose Diarrhea  . Mesalamine Nausea Only  . Methotrexate Other (See Comments)    unknown  . Nitrofuran Derivatives     shakiness  . Nitrofurantoin Other (See Comments)    shakiness other  . Other Diarrhea and Other (See Comments)    Shaking uncontrollably "lettuce only" "lettuce only"  . Sulfa Antibiotics Other (See Comments)    Patient can't recall reaction   . Tdap [Tetanus-Diphth-Acell Pertussis]     Shaking uncontrollably  . Tetanus Toxoid, Adsorbed Other (See Comments)    Shaking uncontrollable     Immunization History  Administered Date(s) Administered  . Fluad Quad(high Dose 65+) 03/15/2019  . Influenza Split 03/18/2012, 03/18/2013, 04/14/2014, 04/17/2017  . Influenza Whole 04/30/2008, 04/20/2009, 04/18/2011  . Influenza, High Dose Seasonal PF 04/23/2015,  03/24/2016,  04/06/2018, 04/02/2020  . Influenza,inj,quad, With Preservative 04/06/2018  . PFIZER(Purple Top)SARS-COV-2 Vaccination 08/24/2019, 09/18/2019, 04/13/2020  . Pneumococcal Conjugate-13 11/11/2013  . Pneumococcal Polysaccharide-23 05/21/2018  . Zoster Recombinat (Shingrix) 05/05/2017    Family History  Problem Relation Age of Onset  . Heart disease Mother   . Arthritis Mother   . Heart attack Father   . Diabetes Other        sibling  . Heart attack Other        sibling     Current Outpatient Medications:  .  albuterol (VENTOLIN HFA) 108 (90 Base) MCG/ACT inhaler, USE 2 INHALATIONS BY MOUTH  EVERY 6 HOURS AS NEEDED (Patient taking differently: Inhale 2 puffs into the lungs every 6 (six) hours as needed for wheezing or shortness of breath.), Disp: 34 g, Rfl: 1 .  aspirin EC 81 MG tablet, Take 81 mg by mouth daily., Disp: , Rfl:  .  benzonatate (TESSALON) 200 MG capsule, TAKE 1 CAPSULE BY MOUTH 3 TIMES A DAY AS NEEDED FOR COUGH (Patient taking differently: Take 200 mg by mouth 3 (three) times daily as needed for cough.), Disp: 30 capsule, Rfl: 3 .  celecoxib (CELEBREX) 200 MG capsule, Take 200 mg by mouth daily., Disp: , Rfl:  .  cetirizine (ZYRTEC) 10 MG tablet, Take 10 mg by mouth daily., Disp: , Rfl:  .  clidinium-chlordiazePOXIDE (LIBRAX) 5-2.5 MG capsule, Take 1 capsule by mouth 3 (three) times daily as needed (for IBS)., Disp: , Rfl:  .  colestipol (COLESTID) 1 g tablet, Take 2 g by mouth daily., Disp: , Rfl:  .  dicyclomine (BENTYL) 10 MG capsule, Take 10 mg by mouth 4 (four) times daily -  before meals and at bedtime., Disp: , Rfl:  .  FLUoxetine (PROZAC) 20 MG capsule, Take 20 mg by mouth daily., Disp: , Rfl:  .  fluticasone (FLONASE) 50 MCG/ACT nasal spray, SPRAY 2 SPRAYS INTO EACH NOSTRIL EVERY DAY (Patient taking differently: Place 2 sprays into both nostrils daily.), Disp: 48 mL, Rfl: 3 .  Fluticasone-Umeclidin-Vilant (TRELEGY ELLIPTA) 100-62.5-25 MCG/INH AEPB,  Inhale 1 puff into the lungs at bedtime., Disp: 180 each, Rfl: 2 .  furosemide (LASIX) 40 MG tablet, Take 40 mg by mouth daily., Disp: , Rfl:  .  HYDROcodone-acetaminophen (NORCO) 7.5-325 MG tablet, Take 1 tablet by mouth 2 (two) times daily as needed for pain., Disp: , Rfl:  .  hydroxychloroquine (PLAQUENIL) 200 MG tablet, Take 2 tablets (400 mg total) by mouth daily. Stop for 2 weeks, post-op. (Patient taking differently: Take 400 mg by mouth daily.), Disp: , Rfl:  .  ibuprofen (ADVIL) 200 MG tablet, Take 400 mg by mouth 2 (two) times daily., Disp: , Rfl:  .  ipratropium (ATROVENT) 0.03 % nasal spray, USE 1 TO 2 SPRAYS IN BOTH  NOSTRILS TWICE DAILY (Patient taking differently: Place 2 sprays into both nostrils 2 (two) times daily.), Disp: 90 mL, Rfl: 3 .  ipratropium-albuterol (DUONEB) 0.5-2.5 (3) MG/3ML SOLN, USE 1 VIAL IN NEBULIZER EVERY 6 HOURS AS NEEDED *J45.901* npi [id number] (Patient taking differently: Inhale 3 mLs into the lungs every 6 (six) hours as needed (Shortness of breath). *J45.901* npi [id number]), Disp: 270 mL, Rfl: 11 .  KLOR-CON M20 20 MEQ tablet, TAKE 1 TABLET BY MOUTH TWICE A DAY, Disp: 180 tablet, Rfl: 3 .  leflunomide (ARAVA) 20 MG tablet, Take 20 mg by mouth daily. , Disp: , Rfl:  .  methocarbamol (ROBAXIN) 750 MG tablet, Take 750 mg by mouth at  bedtime as needed for muscle spasms., Disp: , Rfl:  .  montelukast (SINGULAIR) 10 MG tablet, TAKE 1 TABLET BY MOUTH AT  BEDTIME (Patient taking differently: Take 10 mg by mouth at bedtime.), Disp: 90 tablet, Rfl: 3 .  Multiple Vitamin (MULTIVITAMIN) tablet, Take 1 tablet by mouth daily., Disp: , Rfl:  .  omeprazole (PRILOSEC) 40 MG capsule, Take 40 mg by mouth at bedtime. , Disp: , Rfl:  .  OXYGEN, Inhale 4 mLs into the lungs continuous. , Disp: , Rfl:  .  Pirfenidone (ESBRIET) 801 MG TABS, Take 801 mg by mouth 3 (three) times daily., Disp: 90 tablet, Rfl: 11 .  predniSONE (DELTASONE) 5 MG tablet, Take 5 mg by mouth daily with  breakfast., Disp: , Rfl:  .  promethazine-codeine (PHENERGAN WITH CODEINE) 6.25-10 MG/5ML syrup, Take 5 mLs by mouth every 6 (six) hours as needed for cough., Disp: 200 mL, Rfl: 0 .  rosuvastatin (CRESTOR) 20 MG tablet, Take 1 tablet (20 mg total) by mouth daily., Disp: 90 tablet, Rfl: 3 .  traMADol (ULTRAM) 50 MG tablet, Take 50 mg by mouth 3 (three) times daily as needed., Disp: , Rfl:  .  verapamil (CALAN-SR) 240 MG CR tablet, Take 1 tablet (240 mg total) by mouth daily., Disp: 90 tablet, Rfl: 3      Objective:   Vitals:   10/15/20 1055  BP: 120/62  Pulse: 78  Temp: 97.9 F (36.6 C)  TempSrc: Oral  SpO2: (!) 87%  Weight: 196 lb 1.6 oz (89 kg)  Height: 5' (1.524 m)    Estimated body mass index is 38.3 kg/m as calculated from the following:   Height as of this encounter: 5' (1.524 m).   Weight as of this encounter: 196 lb 1.6 oz (89 kg).  _0 @  Filed Weights   10/15/20 1055  Weight: 196 lb 1.6 oz (89 kg)     Physical Exam General: No distress. Morbidly obese Neuro: Alert and Oriented x 3. GCS 15. Speech normal Psych: Pleasant Resp:  Barrel Chest - no.  Wheeze - no, Crackles - mayb, No overt respiratory distress CVS: Normal heart sounds. Murmurs - n Ext: Stigmata of Connective Tissue Disease - yes RA HEENT: Normal upper airway. PEERL +. No post nasal drip        Assessment:       ICD-10-CM   1. ILD (interstitial lung disease) ([facility])  J84.9   2. Research subject  Z00.6   3. Obstructive sleep apnea  G47.33   4. Chronic respiratory failure with hypoxia (HCC)  J96.11   5. High risk medication use  Z79.899        Plan:     Patient Instructions  Chronic respiratory failure with hypoxia (HCC) Interstitial lung disease due to connective tissue disease (HCC) High risk medication use Therapeutic drug monitoring REsearch subject - Pulse iNO study - randomized 3/29/222  -Cointued Progressive interstitial lung disease.  Currently on maximal  therapy - Plan ---Continue using oxygen 4 L at rest and 5-o 8 L with exertion  -Monitor pulse ox with exertion and keep it over 86% -Continue participation in ILD-pro research registry -Continue pirfenidone =Continue iNO pulsed device per study protocol  Obstructive sleep apnea  Plan -Continue CPAP  Acute sinusitis - new x 4 days symptms  Take doxycycline 179m po twice daily x 5 days; take after meals and avoid sunlight Please take prednisone 40 mg x1 day, then 30 mg x1 day, then 20 mg x1 day, then 10  mg x1 day, and then 5 mg baseline to continue   Asthma - trelegy might be too strong for you or excessive  Plan  - could try symbicort 80/4,5 2 puff twice daily  - use alb as needed  Followup  - research visit per protocol  - make Standard of care visit in 6 months - 30 min - we can cancel v keep it if needed      SIGNATURE    Dr. Brand Males, M.D., F.C.C.P,  Pulmonary and Critical Care Medicine Staff Physician, [facility] Director - Interstitial Lung Disease  Program  [facility] [place], [place], [address]  Pager: [phone], If no answer or between  15:00h - 7:00h: call [phone] Telephone: [phone]  11:39 AM 10/15/2020

## 2020-10-15 NOTE — Addendum Note (Signed)
Addended byCoralie Keens on: 10/15/2020 05:19 PM   Modules accepted: Orders

## 2020-10-15 NOTE — Addendum Note (Signed)
Addended byCoralie Keens on: 10/15/2020 11:51 AM   Modules accepted: Orders

## 2020-10-15 NOTE — Patient Instructions (Signed)
Spirometry/DLCO performed today.

## 2020-10-22 ENCOUNTER — Encounter (HOSPITAL_COMMUNITY): Payer: Self-pay | Admitting: *Deleted

## 2020-10-22 NOTE — Telephone Encounter (Signed)
Left vm for pt to call and schedule cath

## 2020-10-23 NOTE — Telephone Encounter (Signed)
[facility] sch for 4/25

## 2020-10-27 ENCOUNTER — Encounter (HOSPITAL_COMMUNITY): Payer: Self-pay | Admitting: *Deleted

## 2020-10-29 ENCOUNTER — Other Ambulatory Visit (HOSPITAL_COMMUNITY): Payer: Self-pay | Admitting: *Deleted

## 2020-10-29 DIAGNOSIS — I272 Pulmonary hypertension, unspecified: Secondary | ICD-10-CM

## 2020-11-03 ENCOUNTER — Other Ambulatory Visit: Payer: Self-pay | Admitting: Internal Medicine

## 2020-11-04 ENCOUNTER — Other Ambulatory Visit: Payer: Self-pay

## 2020-11-04 MED ORDER — FUROSEMIDE 40 MG PO TABS: 40.0000 mg | ORAL_TABLET | Freq: Every day | ORAL | 0 refills | Status: DC

## 2020-11-06 ENCOUNTER — Other Ambulatory Visit (HOSPITAL_COMMUNITY)
Admission: RE | Admit: 2020-11-06 | Discharge: 2020-11-06 | Disposition: A | Discharge status: Home / Self Care | Payer: Medicare Other | Source: Ambulatory Visit | Attending: Internal Medicine | Admitting: Internal Medicine

## 2020-11-06 ENCOUNTER — Other Ambulatory Visit: Payer: Medicare Other

## 2020-11-06 DIAGNOSIS — Z20822 Contact with and (suspected) exposure to covid-19: Secondary | ICD-10-CM | POA: Insufficient documentation

## 2020-11-06 DIAGNOSIS — Z01812 Encounter for preprocedural laboratory examination: Secondary | ICD-10-CM | POA: Insufficient documentation

## 2020-11-07 ENCOUNTER — Other Ambulatory Visit: Payer: Self-pay | Admitting: Internal Medicine

## 2020-11-07 LAB — SARS CORONAVIRUS 2 (TAT 6-24 HRS): SARS Coronavirus 2: NEGATIVE

## 2020-11-09 ENCOUNTER — Encounter (HOSPITAL_COMMUNITY): Payer: Self-pay | Admitting: Internal Medicine

## 2020-11-09 ENCOUNTER — Encounter (HOSPITAL_COMMUNITY)
Admission: RE | Disposition: A | Discharge status: Home / Self Care | Payer: Self-pay | Source: Home / Self Care | Attending: Internal Medicine

## 2020-11-09 ENCOUNTER — Other Ambulatory Visit: Payer: Self-pay

## 2020-11-09 ENCOUNTER — Ambulatory Visit (HOSPITAL_COMMUNITY)
Admission: RE | Admit: 2020-11-09 | Discharge: 2020-11-09 | Disposition: A | Discharge status: Home / Self Care | Payer: Medicare Other | Attending: Internal Medicine | Admitting: Internal Medicine

## 2020-11-09 DIAGNOSIS — J849 Interstitial pulmonary disease, unspecified: Secondary | ICD-10-CM | POA: Insufficient documentation

## 2020-11-09 DIAGNOSIS — I2721 Secondary pulmonary arterial hypertension: Secondary | ICD-10-CM | POA: Diagnosis not present

## 2020-11-09 DIAGNOSIS — J9611 Chronic respiratory failure with hypoxia: Secondary | ICD-10-CM | POA: Insufficient documentation

## 2020-11-09 DIAGNOSIS — Z79899 Other long term (current) drug therapy: Secondary | ICD-10-CM | POA: Insufficient documentation

## 2020-11-09 DIAGNOSIS — M069 Rheumatoid arthritis, unspecified: Secondary | ICD-10-CM | POA: Diagnosis not present

## 2020-11-09 DIAGNOSIS — G4733 Obstructive sleep apnea (adult) (pediatric): Secondary | ICD-10-CM | POA: Insufficient documentation

## 2020-11-09 DIAGNOSIS — I272 Pulmonary hypertension, unspecified: Secondary | ICD-10-CM

## 2020-11-09 DIAGNOSIS — Z6838 Body mass index (BMI) 38.0-38.9, adult: Secondary | ICD-10-CM | POA: Diagnosis not present

## 2020-11-09 HISTORY — PX: RIGHT HEART CATH: CATH118263

## 2020-11-09 LAB — BASIC METABOLIC PANEL
Anion gap: 5 (ref 5–15)
BUN: 18 mg/dL (ref 8–23)
CO2: 28 mmol/L (ref 22–32)
Calcium: 10.5 mg/dL — ABNORMAL HIGH (ref 8.9–10.3)
Chloride: 106 mmol/L (ref 98–111)
Creatinine, Ser: 0.7 mg/dL (ref 0.44–1.00)
GFR, Estimated: >60 mL/min (ref >60)
Glucose, Bld: 88 mg/dL (ref 70–99)
Potassium: 3.9 mmol/L (ref 3.5–5.1)
Sodium: 139 mmol/L (ref 135–145)

## 2020-11-09 LAB — CBC
HCT: 37 % (ref 36.0–46.0)
Hemoglobin: 11.3 g/dL — ABNORMAL LOW (ref 12.0–15.0)
MCH: 31.9 pg (ref 26.0–34.0)
MCHC: 30.5 g/dL (ref 30.0–36.0)
MCV: 104.5 fL — ABNORMAL HIGH (ref 80.0–100.0)
Platelets: 270 10*3/uL (ref 150–400)
RBC: 3.54 MIL/uL — ABNORMAL LOW (ref 3.87–5.11)
RDW: 13.3 % (ref 11.5–15.5)
WBC: 4.8 10*3/uL (ref 4.0–10.5)
nRBC: 0 % (ref 0.0–0.2)

## 2020-11-09 LAB — POCT I-STAT EG7
Acid-Base Excess: 1 mmol/L (ref 0.0–2.0)
Bicarbonate: 27.7 mmol/L (ref 20.0–28.0)
Calcium, Ion: 1.29 mmol/L (ref 1.15–1.40)
HCT: 32 % — ABNORMAL LOW (ref 36.0–46.0)
Hemoglobin: 10.9 g/dL — ABNORMAL LOW (ref 12.0–15.0)
O2 Saturation: 71 %
Potassium: 3.9 mmol/L (ref 3.5–5.1)
Sodium: 140 mmol/L (ref 135–145)
TCO2: 29 mmol/L (ref 22–32)
pCO2, Ven: 54.8 mmHg (ref 44.0–60.0)
pH, Ven: 7.313 (ref 7.250–7.430)
pO2, Ven: 42 mmHg (ref 32.0–45.0)

## 2020-11-09 SURGERY — RIGHT HEART CATH
Anesthesia: LOCAL

## 2020-11-09 MED ORDER — SODIUM CHLORIDE 0.9% FLUSH: 3.0000 mL | INTRAVENOUS | Status: DC | PRN

## 2020-11-09 MED ORDER — LIDOCAINE HCL (PF) 1 % IJ SOLN
INTRAMUSCULAR | Status: DC | PRN
  Administered 2020-11-09: 2 mL via INTRADERMAL

## 2020-11-09 MED ORDER — SODIUM CHLORIDE 0.9 % IV SOLN
250.0000 mL | INTRAVENOUS | Status: DC | PRN
Start: 2020-11-09 — End: 2020-11-09

## 2020-11-09 MED ORDER — SODIUM CHLORIDE 0.9% FLUSH
3.0000 mL | Freq: Two times a day (BID) | INTRAVENOUS | Status: DC
Start: 2020-11-09 — End: 2020-11-09

## 2020-11-09 MED ORDER — ONDANSETRON HCL 4 MG/2ML IJ SOLN: 4.0000 mg | Freq: Four times a day (QID) | INTRAMUSCULAR | Status: DC | PRN

## 2020-11-09 MED ORDER — SODIUM CHLORIDE 0.9 % IV SOLN: INTRAVENOUS | Status: DC

## 2020-11-09 MED ORDER — ASPIRIN 81 MG PO CHEW
81.0000 mg | CHEWABLE_TABLET | ORAL | Status: AC
  Administered 2020-11-09: 81 mg via ORAL
  Filled 2020-11-09: qty 1

## 2020-11-09 MED ORDER — ACETAMINOPHEN 325 MG PO TABS: 650.0000 mg | ORAL_TABLET | ORAL | Status: DC | PRN

## 2020-11-09 MED ORDER — SODIUM CHLORIDE 0.9% FLUSH
3.0000 mL | INTRAVENOUS | Status: DC | PRN
Start: 2020-11-09 — End: 2020-11-09

## 2020-11-09 MED ORDER — HEPARIN (PORCINE) IN NACL 1000-0.9 UT/500ML-% IV SOLN
INTRAVENOUS | Status: DC | PRN
Start: 2020-11-09 — End: 2020-11-09
  Administered 2020-11-09: 500 mL

## 2020-11-09 MED ORDER — HEPARIN (PORCINE) IN NACL 1000-0.9 UT/500ML-% IV SOLN
INTRAVENOUS | Status: AC
  Filled 2020-11-09: qty 500

## 2020-11-09 MED ORDER — SODIUM CHLORIDE 0.9% FLUSH: 3.0000 mL | Freq: Two times a day (BID) | INTRAVENOUS | Status: DC

## 2020-11-09 MED ORDER — LIDOCAINE HCL (PF) 1 % IJ SOLN
INTRAMUSCULAR | Status: AC
  Filled 2020-11-09: qty 30

## 2020-11-09 MED ORDER — SODIUM CHLORIDE 0.9 % IV SOLN: 250.0000 mL | INTRAVENOUS | Status: DC | PRN

## 2020-11-09 MED ORDER — HYDRALAZINE HCL 20 MG/ML IJ SOLN: 10.0000 mg | INTRAMUSCULAR | Status: DC | PRN

## 2020-11-09 MED ORDER — LABETALOL HCL 5 MG/ML IV SOLN: 10.0000 mg | INTRAVENOUS | Status: DC | PRN

## 2020-11-09 SURGICAL SUPPLY — 5 items
CATH SWAN GANZ 7F STRAIGHT (CATHETERS) ×1 IMPLANT
GLIDESHEATH SLENDER 7FR .021G (SHEATH) ×1 IMPLANT
GUIDEWIRE .025 260CM (WIRE) ×1 IMPLANT
PACK CARDIAC CATHETERIZATION (CUSTOM PROCEDURE TRAY) ×2 IMPLANT
TRANSDUCER W/STOPCOCK (MISCELLANEOUS) ×2 IMPLANT

## 2020-11-09 NOTE — Progress Notes (Signed)
Discharge instructions reviewed with pt voices understanding.

## 2020-11-09 NOTE — Interval H&P Note (Signed)
History and Physical Interval Note:  11/09/2020 10:24 AM  [name]  has presented today for surgery, with the diagnosis of pulmonary HTN.  The various methods of treatment have been discussed with the patient and family. After consideration of risks, benefits and other options for treatment, the patient has consented to  Procedure(s): RIGHT HEART CATH (N/A) as a surgical intervention.  The patient's history has been reviewed, patient examined, no change in status, stable for surgery.  I have reviewed the patient's chart and labs.  Questions were answered to the patient's satisfaction.     Tascha Casares

## 2020-11-09 NOTE — Discharge Instructions (Signed)
Radial Site Care  This sheet gives you information about how to care for yourself after your procedure. Your health care provider may also give you more specific instructions. If you have problems or questions, contact your health care provider. What can I expect after the procedure? After the procedure, it is common to have:  Bruising and tenderness at the catheter insertion area. Follow these instructions at home: Medicines  Take over-the-counter and prescription medicines only as told by your health care provider. Insertion site care 1. Follow instructions from your health care provider about how to take care of your insertion site. Make sure you: ? Wash your hands with soap and water before you remove your bandage (dressing). If soap and water are not available, use hand sanitizer. ? May remove dressing in 24 hours. 2. Check your insertion site every day for signs of infection. Check for: ? Redness, swelling, or pain. ? Fluid or blood. ? Pus or a bad smell. ? Warmth. 3. Do no take baths, swim, or use a hot tub for 5 days. 4. You may shower 24-48 hours after the procedure. ? Remove the dressing and gently wash the site with plain soap and water. ? Pat the area dry with a clean towel. ? Do not rub the site. That could cause bleeding. 5. Do not apply powder or lotion to the site. Activity  1. For 24 hours after the procedure, or as directed by your health care provider: ? Do not flex or bend the affected arm. ? Do not push or pull heavy objects with the affected arm. ? Do not drive yourself home from the hospital or clinic. You may drive 24 hours after the procedure. ? Do not operate machinery or power tools. ? KEEP ARM ELEVATED THE REMAINDER OF THE DAY. 2. Do not push, pull or lift anything that is heavier than 10 lb for 5 days. 3. Ask your health care provider when it is okay to: ? Return to work or school. ? Resume usual physical activities or sports. ? Resume sexual  activity. General instructions  If the catheter site starts to bleed, raise your arm and put firm pressure on the site. If the bleeding does not stop, get help right away. This is a medical emergency.  DRINK PLENTY OF FLUIDS FOR THE NEXT 2-3 DAYS.  No alcohol consumption for 24 hours after receiving sedation.  If you went home on the same day as your procedure, a responsible adult should be with you for the first 24 hours after you arrive home.  Keep all follow-up visits as told by your health care provider. This is important. Contact a health care provider if:  You have a fever.  You have redness, swelling, or yellow drainage around your insertion site. Get help right away if:  You have unusual pain at the radial site.  The catheter insertion area swells very fast.  The insertion area is bleeding, and the bleeding does not stop when you hold steady pressure on the area.  Your arm or hand becomes pale, cool, tingly, or numb. These symptoms may represent a serious problem that is an emergency. Do not wait to see if the symptoms will go away. Get medical help right away. Call your local emergency services (911 in the [place] Do not drive yourself to the hospital. Summary  After the procedure, it is common to have bruising and tenderness at the site.  Follow instructions from your health care provider about how to take care  of your radial site wound. Check the wound every day for signs of infection.  This information is not intended to replace advice given to you by your health care provider. Make sure you discuss any questions you have with your health care provider. Document Revised: 08/09/2017 Document Reviewed: 08/09/2017 Elsevier Patient Education  2020 [employer].

## 2020-11-10 LAB — POCT I-STAT EG7
Acid-Base Excess: 0 mmol/L (ref 0.0–2.0)
Acid-Base Excess: 2 mmol/L (ref 0.0–2.0)
Bicarbonate: 27.1 mmol/L (ref 20.0–28.0)
Bicarbonate: 28.8 mmol/L — ABNORMAL HIGH (ref 20.0–28.0)
Calcium, Ion: 1.13 mmol/L — ABNORMAL LOW (ref 1.15–1.40)
Calcium, Ion: 1.26 mmol/L (ref 1.15–1.40)
HCT: 31 % — ABNORMAL LOW (ref 36.0–46.0)
HCT: 32 % — ABNORMAL LOW (ref 36.0–46.0)
Hemoglobin: 10.5 g/dL — ABNORMAL LOW (ref 12.0–15.0)
Hemoglobin: 10.9 g/dL — ABNORMAL LOW (ref 12.0–15.0)
O2 Saturation: 71 %
O2 Saturation: 78 %
Potassium: 3.6 mmol/L (ref 3.5–5.1)
Potassium: 3.9 mmol/L (ref 3.5–5.1)
Sodium: 141 mmol/L (ref 135–145)
Sodium: 143 mmol/L (ref 135–145)
TCO2: 29 mmol/L (ref 22–32)
TCO2: 30 mmol/L (ref 22–32)
pCO2, Ven: 54.7 mmHg (ref 44.0–60.0)
pCO2, Ven: 56.4 mmHg (ref 44.0–60.0)
pH, Ven: 7.29 (ref 7.250–7.430)
pH, Ven: 7.329 (ref 7.250–7.430)
pO2, Ven: 41 mmHg (ref 32.0–45.0)
pO2, Ven: 49 mmHg — ABNORMAL HIGH (ref 32.0–45.0)

## 2020-11-11 ENCOUNTER — Encounter: Payer: Medicare Other | Admitting: *Deleted

## 2020-11-11 DIAGNOSIS — J849 Interstitial pulmonary disease, unspecified: Secondary | ICD-10-CM

## 2020-11-11 DIAGNOSIS — Z006 Encounter for examination for normal comparison and control in clinical research program: Secondary | ICD-10-CM

## 2020-11-11 NOTE — Research (Signed)
Title:  A Randomized double-blind placebo-controlled dose escalation and verification clinical study to assess the safety and efficacy of pulsed, inhaled nitric oxide (iNO) in subjects at risk of pulmonary hypertension associated with pulmonary fibrosis on long term oxygen therapy (Part 1 and Part 2). Primary Endpoint: The placebo-corrected change for INOpulse in minutes of moderate to vigorous physical activity (MVPA) measured by actigraphy from baseline to month 4. Duration of treatment: Study participants will receive iNO 45 mcg/kg IBW/hr versus placebo for 4 months (16 weeks) during Part 1.  Part 2: Open Label Extension (OLE) Study participants will be offered open label therapy at iNO 45 mcg/kg IBW/hr after completing the Part 1. Protocol #: PULSE-PHPF-001 (REBUILD), ClinicalTrials.gov Identifier: [id number], **Sponsor is [employer], [place], [place] [address]) Amendment 7 dated 20_12_2021 Investigator Brochure Version 11.0 approved 02 Mar 2020  Clinical Research Coordinator / Research RN note : This visit for [name] with DOB: Jun 18, 1946 on 11/11/2020 for the above protocol is Visit/Encounter #4  and is for purpose of research.   The consent for this encounter is under Protocol VersionAmendment 7 dated 20_12_2021, Investigator Brochure Version 11.0 approved 02 Mar 2020 and is currently IRB approved.   Subject expressed continued interest and consent in continuing as a study subject. Subject confirmed that there was no change in contact information (e.g. address, telephone, email). Subject thanked for participation in research and contribution to science.    All Visit 4 procedures were completed per protocol.  For complete details, please see the subject's binder.   Signed by  [name] Clinical Research Coordinator  [facility]  [place], [place] 2:31 PM 11/11/2020

## 2020-11-11 NOTE — Research (Signed)
Title: Chronic Fibrosing Interstitial Lung Disease with Progressive Phenotype Prospective Outcomes (ILD-PRO) Registry   Protocol #: IPF-PRO-SUB, Clinical Trials # [id number], Sponsor: [employer]  Protocol Version Amendment 4 dated 12Sep2019  and confirmed current on 11/11/2020 Consent Version for today's visit date of 11/11/2020  is [facility] IRB Approved Version 21 Jun 2018  Objectives:  [place] Kitchen Describe current approaches to diagnosis and treatment of chronic fibrosing ILDs with progressive phenotype  . Describe the natural history of chronic fibrosing ILDs with progressive phenotype  . Assess quality of life from self-administered participant reported questionnaires for each disease group  . Describe participant interactions with the healthcare system, describe treatment practices across multiple institutions for each disease group  . Collect biological samples linked to well characterized chronic fibrosing ILDs with progressive phenotype to identify disease biomarkers  . Collect data and biological samples that will support future research studies.                                            Key Inclusion Criteria: Willing and able to provide informed consent  Age ? 30 years  Diagnosis of a non-IPF ILD of any duration, including, but not limited to Idiopathic Non-Specific Interstitial Pneumonia (INSIP), Unclassifiable Idiopathic Interstitial Pneumonias (IIPs), Interstitial Pneumonia with Autoimmune Features (IPAF), Autoimmune ILDs such as Rheumatoid Arthritis (RA-ILD) and Systemic Sclerosis (SSC-ILD), Chronic Hypersensitivity Pneumonitis (HP), Sarcoidosis or Exposure-related ILDs such as asbestosis.  Chronic fibrosing ILD defined by reticular abnormality with traction bronchiectasis with or without honeycombing confirmed by chest HRCT scan and/or lung biopsy.  Progressive phenotype as defined by fulfilling at least one of the criteria below of fibrotic changes (progression set  point) within the last 24 months regardless of treatment considered appropriate in individual ILDs:  . decline in FVC % predicted (% pred) based on >10% relative decline  . decline in FVC % pred based on ? 5 - <10% relative decline in FVC combined with worsening of respiratory symptoms as assessed by the site investigator  . decline in FVC % pred based on ? 5 - <10% relative decline in FVC combined with increasing extent of fibrotic changes on chest imaging (HRCT scan) as assessed by the site investigator  . decline in DLCO % pred based on ? 10% relative decline  . worsening of respiratory symptoms as well as increasing extent of fibrotic changes on chest imaging (HRCT scan) as assessed by the site investigator independent of FVC change.    Key Exclusion Criteria: Malignancy, treated or untreated, other than skin or early stage prostate cancer, within the past 5 years  Currently listed for lung transplantation at the time of enrollment  Currently enrolled in a clinical trial at the time of enrollment in this registry    Clinical Research Coordinator / Research RN note : This visit for [place] with DOB: [birth year] on 11/11/2020 for the above protocol is Visit/Encounter #3  and is for purpose of research.   The consent for this encounter is under Protocol Version Amendment 4 (Version Date: 29 March 2018), Consent-   [facility] IRB Approved Version 21 Jun 2018 Revised 21 Jun 2018 and is currently IRB approved.   Subject expressed continued interest and consent in continuing as a study subject. Subject confirmed that there was no change in contact information (e.g. address, telephone, email). Subject thanked for participation in research and contribution to  science.   During this visit on 11/11/2020, the subject completed the blood work and questionnaires per the above referenced protocol.  Please refer to the subject's paper source binder for further details.  Signed by  [place] Coordinator [facility]  [place], [place] 2:16 PM 11/11/2020

## 2020-11-26 ENCOUNTER — Other Ambulatory Visit: Payer: Self-pay | Admitting: Internal Medicine

## 2020-12-08 ENCOUNTER — Encounter: Payer: Medicare Other | Admitting: *Deleted

## 2020-12-08 ENCOUNTER — Other Ambulatory Visit: Payer: Self-pay

## 2020-12-08 ENCOUNTER — Other Ambulatory Visit: Payer: Self-pay | Admitting: *Deleted

## 2020-12-08 ENCOUNTER — Ambulatory Visit: Payer: Medicare Other | Admitting: Internal Medicine

## 2020-12-08 DIAGNOSIS — J849 Interstitial pulmonary disease, unspecified: Secondary | ICD-10-CM

## 2020-12-08 DIAGNOSIS — Z006 Encounter for examination for normal comparison and control in clinical research program: Secondary | ICD-10-CM

## 2020-12-08 LAB — PULMONARY FUNCTION TEST
DL/VA % pred: 96 %
DL/VA: 4.08 ml/min/mmHg/L
DLCO unc % pred: 54 %
DLCO unc: 9.05 ml/min/mmHg
FEF 25-75 Pre: 0.94 L/sec
FEF2575-%Pred-Pre: 68 %
FEV1-%Pred-Pre: 66 %
FEV1-Pre: 1.14 L
FEV1FVC-%Pred-Pre: 105 %
FEV6-%Pred-Pre: 66 %
FEV6-Pre: 1.45 L
FEV6FVC-%Pred-Pre: 105 %
FVC-%Pred-Pre: 63 %
FVC-Pre: 1.45 L
Pre FEV1/FVC ratio: 78 %
Pre FEV6/FVC Ratio: 100 %

## 2020-12-08 NOTE — Progress Notes (Signed)
pft  

## 2020-12-08 NOTE — Research (Signed)
Title:  A Randomized double-blind placebo-controlled dose escalation and verification clinical study to assess the safety and efficacy of pulsed, inhaled nitric oxide (iNO) in subjects at risk of pulmonary hypertension associated with pulmonary fibrosis on long term oxygen therapy (Part 1 and Part 2). Primary Endpoint: The placebo-corrected change for INOpulse in minutes of moderate to vigorous physical activity (MVPA) measured by actigraphy from baseline to month 4. Duration of treatment: Study participants will receive iNO 45 mcg/kg IBW/hr versus placebo for 4 months (16 weeks) during Part 1.  Part 2: Open Label Extension (OLE) Study participants will be offered open label therapy at iNO 45 mcg/kg IBW/hr after completing the Part 1. Protocol #: PULSE-PHPF-001 (REBUILD), ClinicalTrials.gov Identifier: [id number], **Sponsor is [employer], [place], [place] [address]) Scientist, physiological / Research RN note : This visit for [place] with DOB: 04/16/1946 on 12/08/2020 for the above protocol is Visit/Encounter # Visit 5  and is for purpose of research.   The consent for this encounter is under Protocol Version Amendment 7 dated 20_12_2021 , Investigator Brochure Edition version 11.0 approved 02 Mar 2020 Consent Version 02dec2021  and  Is currently IRB approved.   Subject  expressed continued interest and consent in continuing as a study subject. Subject confirmed that there was no change in contact information (e.g. address, telephone, email). Subject thanked for participation in research and contribution to science.   Additional details (CRC to choose 1 and delete the other)  Assessments and procedures completed per the above listed protocol.   Subject stated she stopped taking the hydrochloroquine 11Apr2022, this has been updated on conmed log. Subject stated no further medication changes have been made.       Signed by [place] Bing, CMA, BS,  [place] Coordinator  [place], [place] 2:05 PM 12/08/2020

## 2020-12-13 ENCOUNTER — Other Ambulatory Visit: Payer: Self-pay | Admitting: Internal Medicine

## 2020-12-17 ENCOUNTER — Other Ambulatory Visit: Payer: Self-pay | Admitting: Internal Medicine

## 2020-12-17 DIAGNOSIS — I2510 Atherosclerotic heart disease of native coronary artery without angina pectoris: Secondary | ICD-10-CM

## 2021-01-06 ENCOUNTER — Encounter: Payer: Medicare Other | Admitting: *Deleted

## 2021-01-06 DIAGNOSIS — J849 Interstitial pulmonary disease, unspecified: Secondary | ICD-10-CM

## 2021-01-06 DIAGNOSIS — Z006 Encounter for examination for normal comparison and control in clinical research program: Secondary | ICD-10-CM

## 2021-01-06 NOTE — Research (Signed)
Title:  A Randomized double-blind placebo-controlled dose escalation and verification clinical study to assess the safety and efficacy of pulsed, inhaled nitric oxide (iNO) in subjects at risk of pulmonary hypertension associated with pulmonary fibrosis on long term oxygen therapy (Part 1 and Part 2). Primary Endpoint: The placebo-corrected change for INOpulse in minutes of moderate to vigorous physical activity (MVPA) measured by actigraphy from baseline to month 4. Duration of treatment: Study participants will receive iNO 45 mcg/kg IBW/hr versus placebo for 4 months (16 weeks) during Part 1.  Part 2: Open Label Extension (OLE) Study participants will be offered open label therapy at iNO 45 mcg/kg IBW/hr after completing the Part 1. Protocol #: PULSE-PHPF-001 (REBUILD), ClinicalTrials.gov Identifier: [id number], **Sponsor is [employer], [place], [place] [address]) Protocol Version Amendment 7- dated 20_12_2021 Consent Version for 01/06/2021 cohort 3 approved 02Dec2021 Investigator Brochure Version for version 11.0 approved 02 Mar 2020  Clinical Research Coordinator / Research RN note : This visit for [name] with DOB: January 30, 1946 on 01/06/2021 for the above protocol is Visit/Encounter # 6 and is for purpose of research.   Subject expressed continued interest and consent in continuing as a study subject. Subject confirmed that there was no change in contact information (e.g. address, telephone, email). Subject thanked for participation in research and contribution to science.    During this visit on 01/06/2021, the subject completed all Visit 6 procedures per protocol. For complete details, please see subject's binder.    Signed by  [place] Coordinator  [facility]  [place], [place] 1:56 PM 01/06/2021

## 2021-01-15 ENCOUNTER — Other Ambulatory Visit: Payer: Self-pay | Admitting: Internal Medicine

## 2021-02-01 ENCOUNTER — Other Ambulatory Visit: Payer: Self-pay | Admitting: *Deleted

## 2021-02-01 DIAGNOSIS — J849 Interstitial pulmonary disease, unspecified: Secondary | ICD-10-CM

## 2021-02-01 DIAGNOSIS — Z006 Encounter for examination for normal comparison and control in clinical research program: Secondary | ICD-10-CM

## 2021-02-02 ENCOUNTER — Encounter: Payer: Medicare Other | Admitting: *Deleted

## 2021-02-02 ENCOUNTER — Other Ambulatory Visit: Payer: Self-pay

## 2021-02-02 ENCOUNTER — Ambulatory Visit: Payer: Medicare Other

## 2021-02-02 DIAGNOSIS — J849 Interstitial pulmonary disease, unspecified: Secondary | ICD-10-CM

## 2021-02-02 DIAGNOSIS — Z006 Encounter for examination for normal comparison and control in clinical research program: Secondary | ICD-10-CM

## 2021-02-02 LAB — PULMONARY FUNCTION TEST
DL/VA % pred: 85 %
DL/VA: 3.58 ml/min/mmHg/L
DLCO unc % pred: 53 %
DLCO unc: 9.22 ml/min/mmHg
FEF 25-75 Pre: 0.41 L/sec
FEF2575-%Pred-Pre: 28 %
FEV1-%Pred-Pre: 69 %
FEV1-Pre: 1.26 L
FEV1FVC-%Pred-Pre: 84 %
FEV6-%Pred-Pre: 85 %
FEV6-Pre: 1.95 L
FEV6FVC-%Pred-Pre: 103 %
FVC-%Pred-Pre: 82 %
FVC-Pre: 2 L
Pre FEV1/FVC ratio: 63 %
Pre FEV6/FVC Ratio: 98 %

## 2021-02-02 NOTE — Research (Signed)
Title:  A Randomized double-blind placebo-controlled dose escalation and verification clinical study to assess the safety and efficacy of pulsed, inhaled nitric oxide (iNO) in subjects at risk of pulmonary hypertension associated with pulmonary fibrosis on long term oxygen therapy (Part 1 and Part 2). Primary Endpoint: The placebo-corrected change for INOpulse in minutes of moderate to vigorous physical activity (MVPA) measured by actigraphy from baseline to month 4. Duration of treatment: Study participants will receive iNO 45 mcg/kg IBW/hr versus placebo for 4 months (16 weeks) during Part 1. Part 2: Open Label Extension (OLE) Study participants will be offered open label therapy at iNO 45 mcg/kg IBW/hr after completing the Part 1. Protocol #: PULSE-PHPF-001 (REBUILD), ClinicalTrials.gov Identifier: [id number], **Sponsor is [employer], [place], [place] [address]) Protocol Version Amendment 7 dated 25Aug 2021 Consent Version for 02/02/2021 date of cohort 3 approved 02Dec2021 Investigator Brochure Version 11.0 approved 02 Mar 2020  [facility] [place] Coordinator note :   This visit for [place] with DOB: January 14, 1946 on 02/02/2021 for the above protocol is Visit/Encounter Visit 7 & OLE Cohort 3 Visit 1 and is for purpose of research.   Subject expressed continued interest and consent in continuing as a study subject. Subject confirmed that there was no change in contact information (e.g. address, telephone, email). Subject thanked for participation in research and contribution to science. In this visit 02/02/2021 the subject will be evaluated by Sub Investigator named [name]. This research coordinator has verified that the above investigator is up to date with his training logs.   The subject was informed that the PI [name] continues to have oversight of the subject's visits and course through relevant discussions, reviews and also  specifically of this visit by routing of this note to the [facility].   During this visit on 02/02/2021 the subject completed all Visit 7 & OLE Cohort 3 Visit 1 procedures per protocol. Sub Investigator Dr. Linna Darner performed the physical exam and signed the Inclusion/Exclusion for Part 2: Open Label Extension. For complete details, please see the subject binder.  Signed by  [name] Clinical Research Coordinator  [facility]  [place], [place] 3:46 PM 02/02/2021

## 2021-02-03 ENCOUNTER — Telehealth: Payer: Self-pay | Admitting: Internal Medicine

## 2021-02-03 NOTE — Progress Notes (Signed)
[name], kinn of birth June 16, 1946, was seen 02 February 2021 for examination. Respiratory symptoms are stable except for increase in postnasal drainage,worse over the past 2 weeks.  She relates this to her exposure to 4 cats and 1 dog in the home environment.  The postnasal drainage is associated with cough at night and in the morning.  She has associated sneezing, and watery/itchy eyes. On exam there were no significant ENT findings.  She did have fine rales at the bases.  There was slight accentuation of S2.  Pedal pulses are decreased.  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  There is a large cyst at the left lateral wrist.  Infraumbilical hernia is present.  There is hyperpigmentation over the right medial ankle. For the extrinsic rhinitis a nasal hygiene for cleansing allergens from the nasal mucosa was discussed.  Additionally generic fluticasone and fexofenadine or loratadine were recommended.                                                      Darrick Penna. Linna Darner, MD, SI

## 2021-02-03 NOTE — Telephone Encounter (Signed)
Spoke with [employer] who say they have a tech that will be coming out to pt home to look at her O2 concentrator. Pt states that she completed 6 min walk with 6L of O2 yesterday with [facility]. LOV states pt needs 4L. Informed pt in to order to place order for larger concentrator we would have to qualify her for higher level. She stated understanding. [name] please advise.

## 2021-02-11 NOTE — Telephone Encounter (Signed)
Please get the 12md test from [facility] [name] to confirm if she needed 6L. She did the walk test with 6L [name]  Then, with her name on it we can submit it to scanning in epic and to [employer] and we can do order with [facility] for larger conc

## 2021-02-11 NOTE — Telephone Encounter (Signed)
I have sent [name] with [facility] a secure chat message asking her if she has the 6MW so that way we can make a copy of it and send to scan. I am also routing this encounter to [facility] as well.  Please advise.

## 2021-02-12 NOTE — Telephone Encounter (Signed)
Message was received from [name] with [facility]  stating that she did have pt's 6MW. A copy has been given to me which I will send to scan so we can get it added into pt's chart.  When looking at the 6MW, it does show that pt did need 6L O2.  Called and spoke with pt letting her know this info and stated to her that we would send Rx to [facility] for her to receive a new concentrator that goes up to 10L. Pt said that she has already received a new concentrator from [employer] that goes up to 10L as her prior one broke. Nothing further needed.

## 2021-02-23 NOTE — Progress Notes (Signed)
[facility] Note  02/24/2021     CHIEF COMPLAINT Patient presents for Retina Follow Up   HISTORY OF PRESENT ILLNESS: [name] is a 75 y.o. female who presents to the clinic today for:     Referring physician: Lorene Dy, MD [address], [place],  [place] [address]  HISTORICAL INFORMATION:   Selected notes from the MEDICAL RECORD NUMBER Referred by Dr. Truman Hayward:  Ocular Hx- PMH-    CURRENT MEDICATIONS: No current outpatient medications on file. (Ophthalmic Drugs)   No current facility-administered medications for this visit. (Ophthalmic Drugs)   Current Outpatient Medications (Other)  Medication Sig   albuterol (VENTOLIN HFA) 108 (90 Base) MCG/ACT inhaler USE 2 INHALATIONS BY MOUTH  EVERY 6 HOURS AS NEEDED (Patient taking differently: Inhale 2 puffs into the lungs every 6 (six) hours as needed for wheezing or shortness of breath.)   aspirin EC 81 MG tablet Take 81 mg by mouth daily.   benzonatate (TESSALON) 200 MG capsule TAKE 1 CAPSULE BY MOUTH 3 TIMES A DAY AS NEEDED FOR COUGH (Patient taking differently: Take 200 mg by mouth 3 (three) times daily as needed for cough.)   celecoxib (CELEBREX) 200 MG capsule Take 200 mg by mouth daily.   Cholecalciferol (VITAMIN D3) 250 MCG (10000 UT) capsule Take 10,000 Units by mouth 4 (four) times a week.   clidinium-chlordiazePOXIDE (LIBRAX) 5-2.5 MG capsule Take 1 capsule by mouth 3 (three) times daily as needed (for IBS).   colestipol (COLESTID) 1 g tablet Take 2 g by mouth daily.   dicyclomine (BENTYL) 20 MG tablet Take 20 mg by mouth 4 (four) times daily as needed for spasms.   FLUoxetine (PROZAC) 20 MG capsule Take 20 mg by mouth daily.   fluticasone (FLONASE) 50 MCG/ACT nasal spray SPRAY 2 SPRAYS INTO EACH NOSTRIL EVERY DAY (Patient taking differently: Place 2 sprays into both nostrils daily.)   Fluticasone-Umeclidin-Vilant (TRELEGY ELLIPTA) 100-62.5-25 MCG/INH AEPB Inhale 1 puff into the  lungs at bedtime.   furosemide (LASIX) 40 MG tablet TAKE 1 TABLET BY MOUTH EVERY DAY   HYDROcodone-acetaminophen (NORCO) 7.5-325 MG tablet Take 1 tablet by mouth daily as needed for pain.   ibuprofen (ADVIL) 200 MG tablet Take 400 mg by mouth 3 (three) times daily.   ipratropium (ATROVENT) 0.03 % nasal spray USE 1 TO 2 SPRAYS IN BOTH  NOSTRILS TWICE DAILY   ipratropium-albuterol (DUONEB) 0.5-2.5 (3) MG/3ML SOLN USE 1 VIAL IN NEBULIZER EVERY 6 HOURS AS NEEDED *J45.901* npi [id number] (Patient taking differently: Inhale 3 mLs into the lungs 3 (three) times daily as needed (Shortness of breath). *J45.901* npi [id number])   KLOR-CON M20 20 MEQ tablet TAKE 1 TABLET BY MOUTH TWICE A DAY (Patient taking differently: Take 20 mEq by mouth 2 (two) times daily.)   leflunomide (ARAVA) 20 MG tablet Take 20 mg by mouth daily.    methocarbamol (ROBAXIN) 750 MG tablet Take 750 mg by mouth every 6 (six) hours as needed for muscle spasms.   montelukast (SINGULAIR) 10 MG tablet TAKE 1 TABLET BY MOUTH AT  BEDTIME   omeprazole (PRILOSEC) 40 MG capsule Take 40 mg by mouth at bedtime.    OXYGEN Inhale 4 mLs into the lungs continuous.    Pirfenidone (ESBRIET) 801 MG TABS Take 801 mg by mouth 3 (three) times daily.   predniSONE (DELTASONE) 5 MG tablet Take 5 mg by mouth daily with breakfast.   rosuvastatin (CRESTOR) 20 MG tablet TAKE 1 TABLET BY  MOUTH  DAILY   traMADol (ULTRAM) 50 MG tablet Take 50 mg by mouth 3 (three) times daily as needed for moderate pain.   traZODone (DESYREL) 50 MG tablet Take 50 mg by mouth at bedtime.   verapamil (CALAN-SR) 240 MG CR tablet TAKE 1 TABLET BY MOUTH  DAILY   cetirizine (ZYRTEC) 10 MG tablet Take 10 mg by mouth daily.   No current facility-administered medications for this visit. (Other)      REVIEW OF SYSTEMS: ROS   Positive for: Gastrointestinal, Musculoskeletal, Cardiovascular, Eyes, Respiratory, Psychiatric Negative for: Constitutional, Neurological, Skin, Genitourinary,  HENT, Endocrine, Allergic/Imm, Heme/Lymph Last edited by Leonie Douglas, COA on 02/24/2021  2:16 PM.        ALLERGIES Allergies  Allergen Reactions   Cefdinir Diarrhea   Erythromycin Base Diarrhea   Lactose Dermatitis   Lactulose Diarrhea   Mesalamine Nausea Only   Methotrexate Other (See Comments)    Felt sick   Nitrofuran Derivatives     shakiness   Other Diarrhea and Other (See Comments)    Shaking uncontrollably "lettuce only" "lettuce only"   Sulfa Antibiotics Other (See Comments)    Patient can't recall reaction    Tdap [Tetanus-Diphth-Acell Pertussis]     Shaking uncontrollably   Tetanus Toxoid, Adsorbed Other (See Comments)    Shaking uncontrollable     PAST MEDICAL HISTORY Past Medical History:  Diagnosis Date   Acute asthmatic bronchitis    Allergic rhinitis    Anxiety    Arthritis    Esophageal reflux    Hypertension    Interstitial lung disease ([place]) dx jan 2020   Irritable bowel syndrome    Oxygen dependent    4 liters day time 6 liters at night   PONV (postoperative nausea and vomiting)    ponv likes zofran, and scopolamine patch   Rheumatoid arthritis(714.0)    Sleep apnea    Past Surgical History:  Procedure Laterality Date    c secttion  1977   2 foot fusions Left    total of 6 left foot sx   ABDOMINAL HYSTERECTOMY     complete    ANKLE FUSION  2009   left   BACK SURGERY     lower l to l 5 fused   CHOLECYSTECTOMY     RIGHT HEART CATH N/A 11/09/2020   Procedure: RIGHT HEART CATH;  Surgeon: Jolaine Artist, MD;  Location: [facility];  Service: Cardiovascular;  Laterality: N/A;   RIGHT/LEFT HEART CATH AND CORONARY ANGIOGRAPHY N/A 09/16/2016   Procedure: Right/Left Heart Cath and Coronary Angiography;  Surgeon: Nelva Bush, MD;  Location: [facility];  Service: Cardiovascular;  Laterality: N/A;   TONSILLECTOMY     TOTAL HIP ARTHROPLASTY Right 12/20/2018   Procedure: TOTAL HIP ARTHROPLASTY ANTERIOR APPROACH;  Surgeon:  Paralee Cancel, MD;  Location: [facility];  Service: Orthopedics;  Laterality: Right;  70 mins   TOTAL KNEE ARTHROPLASTY Bilateral     FAMILY HISTORY Family History  Problem Relation Age of Onset   Heart disease Mother    Arthritis Mother    Heart attack Father    Diabetes Other        sibling   Heart attack Other        sibling    SOCIAL HISTORY Social History   Tobacco Use   Smoking status: Never   Smokeless tobacco: Never   Tobacco comments:    positive passive tobacco smoke exposure  Vaping Use   Vaping  Use: Never used  Substance Use Topics   Alcohol use: Yes    Comment: glass of wine each night    Drug use: No         OPHTHALMIC EXAM:  Base Eye Exam     Visual Acuity (Snellen - Linear)       Right Left   Dist [place] 20/30 20/30 +1   Dist ph [place] 20/25 -2 NI   Near [place]  J1+         Tonometry (Tonopen, 2:34 PM)       Right Left   Pressure 16 15         Pupils       Dark Light Shape React APD   Right 3 2 Round Minimal None   Left 3 2 Round Minimal None         Visual Fields (Counting fingers)       Left Right    Full Full         Extraocular Movement       Right Left    Full Full         Neuro/Psych     Oriented x3: Yes   Mood/Affect: Normal         Dilation     Both eyes: 1.0% Mydriacyl, 2.5% Phenylephrine @ 2:34 PM           Slit Lamp and Fundus Exam     Slit Lamp Exam       Right Left   Lids/Lashes Normal Normal   Conjunctiva/Sclera Inferior scleral show Inferior scleral show   Cornea arcus, 3-4+ Punctate epithelial erosions, Debris in tear film arcus, 3-4+ Punctate epithelial erosions, Debris in tear film,decreased TBUT   Anterior Chamber Deep and quiet Deep and quiet   Iris Round and dilated Round and dilated, focal atrophy 0430   Lens PC IOL in good position with mild superior didplacement PC IOL in good position with trace PCO   Vitreous Vitreous syneresis, Posterior vitreous detachment, vitreous condensations  Vitreous syneresis, Posterior vitreous detachment         Fundus Exam       Right Left   Disc Mild Pallor, Sharp rim, Compact  Mild Pallor, Sharp rim   C/D Ratio 0.2 0.2   Macula Flat, Blunted foveal reflex, RPE mottling and clumping, fine Drusen, No heme or edema Flat, Blunted foveal reflex, RPE mottling and clumping, fine Drusen, mild ERM, No heme or edema   Vessels attenuated, mild AV crossing changes, mild tortuousity attenuated, mild AV crossing changes, mild tortuousity   Periphery Attached, No heme  Attached, No heme            Refraction     Manifest Refraction       Sphere Cylinder Dist VA   Right -0.75 Sphere 20/25-   Left -0.75 Sphere 20/20-            IMAGING AND PROCEDURES  Imaging and Procedures for 02/24/2021  OCT, Retina - OU - Both Eyes       Right Eye Quality was good. Central Foveal Thickness: 295. Progression has been stable. Findings include normal foveal contour, no IRF, no SRF, retinal drusen (Fine drusen, no ORA, no Plaquenil toxicity).   Left Eye Quality was good. Central Foveal Thickness: 311. Progression has been stable. Findings include normal foveal contour, no IRF, no SRF, epiretinal membrane, retinal drusen (Mild ERM, Fine drusen, no ORA, no Plaquenil toxicity).   Notes *Images captured and stored  on drive  Diagnosis / Impression:  NFP, no IRF/SRF OU Fine drusen, no ORA, no Plaquenil toxicity OU Mild ERM OS  Clinical management:  See below  Abbreviations: NFP - Normal foveal profile. CME - cystoid macular edema. PED - pigment epithelial detachment. IRF - intraretinal fluid. SRF - subretinal fluid. EZ - ellipsoid zone. ERM - epiretinal membrane. ORA - outer retinal atrophy. ORT - outer retinal tubulation. SRHM - subretinal hyper-reflective material. IRHM - intraretinal hyper-reflective material            ASSESSMENT/PLAN:    ICD-10-CM   1. Early dry stage nonexudative age-related macular degeneration of both eyes   H35.3131     2. Retinal edema  H35.81 OCT, Retina - OU - Both Eyes    3. Rheumatoid arthritis with positive rheumatoid factor, involving unspecified site ([place])  M05.9     4. Long-term use of Plaquenil  Z79.899     5. Epiretinal membrane (ERM) of left eye  H35.372     6. Essential hypertension  I10     7. Hypertensive retinopathy of both eyes  H35.033     8. Pseudophakia, both eyes  Z96.1       1,2. Age related macular degeneration, non-exudative, both eyes  - early stage w/ fine drusen OU  -The incidence, anatomy, and pathology of dry AMD, risk of progression, and the AREDS and AREDS 2 study including smoking risks discussed with patient.  - Recommend amsler grid monitoring  - f/u 12 months, DFE, OCT  3,4. Rheumatoid Arthritis with Plaquenil Use  - no retinal toxicity noted on exam or OCT today  - pt was taking 400 mg daily for ~4 years.   - pt reports self-d/c'd plaquenil use in June 2022 and doing well from an RA standpoint  - pt wt is 92 kg  - 400/92 = 4.35 mg/kg/day  - the AAO recommends daily dosing of < 5.0 mg/kg/day for HCQ  - discussed mechanism of retinal toxicity and association with long-term use (>5 yrs)  5. Epiretinal membrane, OS - The natural history, anatomy, potential for loss of vision, and treatment options including vitrectomy techniques and the complications of endophthalmitis, retinal detachment, vitreous hemorrhage, cataract progression and permanent vision loss discussed with the patient. - mild ERM - BCVA 20/20 - asymptomatic, no metamorphopsia - no indication for surgery at this time - monitor for now  6,7. Hypertensive retinopathy OU - discussed importance of tight BP control - monitor  8. Pseudophakia OU  - s/p CE/IOL (Dr. Dolores Lory)  - IOL in good position, doing well - monitor  Ophthalmic Meds Ordered this visit:  No orders of the defined types were placed in this encounter.     Return in about 1 year (around 02/24/2022) for non exu ARMD  OU, ERM OS - Dilated Exam, OCT.  There are no Patient Instructions on file for this visit.   Explained the diagnoses, plan, and follow up with the patient and they expressed understanding.  Patient expressed understanding of the importance of proper follow up care.   This document serves as a record of services personally performed by Gardiner Sleeper, MD, PhD. It was created on their behalf by Roselee Nova, COMT. The creation of this record is the provider's dictation and/or activities during the visit.  Electronically signed by: Roselee Nova, COMT 02/25/21 1:57 PM  Gardiner Sleeper, M.D., Ph.D. Diseases & Surgery of the Retina and Vitreous [facility]  I have reviewed the  above documentation for accuracy and completeness, and I agree with the above. Gardiner Sleeper, M.D., Ph.D. 02/25/21 1:57 PM  Abbreviations: M myopia (nearsighted); A astigmatism; H hyperopia (farsighted); P presbyopia; Mrx spectacle prescription;  CTL contact lenses; OD right eye; OS left eye; OU both eyes  XT exotropia; ET esotropia; PEK punctate epithelial keratitis; PEE punctate epithelial erosions; DES dry eye syndrome; MGD meibomian gland dysfunction; ATs artificial tears; PFAT's preservative free artificial tears; [place] nuclear sclerotic cataract; PSC posterior subcapsular cataract; ERM epi-retinal membrane; PVD posterior vitreous detachment; RD retinal detachment; DM diabetes mellitus; DR diabetic retinopathy; NPDR non-proliferative diabetic retinopathy; PDR proliferative diabetic retinopathy; CSME clinically significant macular edema; DME diabetic macular edema; dbh dot blot hemorrhages; CWS cotton wool spot; POAG primary open angle glaucoma; C/D cup-to-disc ratio; HVF humphrey visual field; GVF goldmann visual field; OCT optical coherence tomography; IOP intraocular pressure; BRVO Branch retinal vein occlusion; CRVO central retinal vein occlusion; CRAO central retinal artery occlusion; BRAO branch  retinal artery occlusion; RT retinal tear; SB scleral buckle; PPV pars plana vitrectomy; VH Vitreous hemorrhage; PRP panretinal laser photocoagulation; IVK intravitreal kenalog; VMT vitreomacular traction; MH Macular hole;  NVD neovascularization of the disc; NVE neovascularization elsewhere; AREDS age related eye disease study; ARMD age related macular degeneration; POAG primary open angle glaucoma; EBMD epithelial/anterior basement membrane dystrophy; ACIOL anterior chamber intraocular lens; IOL intraocular lens; PCIOL posterior chamber intraocular lens; Phaco/IOL phacoemulsification with intraocular lens placement; [place] photorefractive keratectomy; LASIK laser assisted in situ keratomileusis; HTN hypertension; DM diabetes mellitus; COPD chronic obstructive pulmonary disease

## 2021-02-24 ENCOUNTER — Other Ambulatory Visit: Payer: Self-pay

## 2021-02-24 ENCOUNTER — Ambulatory Visit (INDEPENDENT_AMBULATORY_CARE_PROVIDER_SITE_OTHER): Payer: Medicare Other | Admitting: Ophthalmology

## 2021-02-24 DIAGNOSIS — H353131 Nonexudative age-related macular degeneration, bilateral, early dry stage: Secondary | ICD-10-CM | POA: Diagnosis not present

## 2021-02-24 DIAGNOSIS — H3581 Retinal edema: Secondary | ICD-10-CM | POA: Diagnosis not present

## 2021-02-24 DIAGNOSIS — I10 Essential (primary) hypertension: Secondary | ICD-10-CM

## 2021-02-24 DIAGNOSIS — Z79899 Other long term (current) drug therapy: Secondary | ICD-10-CM | POA: Diagnosis not present

## 2021-02-24 DIAGNOSIS — M059 Rheumatoid arthritis with rheumatoid factor, unspecified: Secondary | ICD-10-CM | POA: Diagnosis not present

## 2021-02-24 DIAGNOSIS — H35372 Puckering of macula, left eye: Secondary | ICD-10-CM | POA: Diagnosis not present

## 2021-02-24 DIAGNOSIS — Z961 Presence of intraocular lens: Secondary | ICD-10-CM

## 2021-02-24 DIAGNOSIS — H35033 Hypertensive retinopathy, bilateral: Secondary | ICD-10-CM

## 2021-02-25 ENCOUNTER — Encounter (INDEPENDENT_AMBULATORY_CARE_PROVIDER_SITE_OTHER): Payer: Self-pay | Admitting: Ophthalmology

## 2021-03-29 ENCOUNTER — Other Ambulatory Visit: Payer: Self-pay | Admitting: *Deleted

## 2021-03-29 ENCOUNTER — Ambulatory Visit: Payer: Medicare Other | Admitting: Internal Medicine

## 2021-03-29 ENCOUNTER — Other Ambulatory Visit: Payer: Self-pay

## 2021-03-29 ENCOUNTER — Encounter: Payer: Medicare Other | Admitting: *Deleted

## 2021-03-29 DIAGNOSIS — Z006 Encounter for examination for normal comparison and control in clinical research program: Secondary | ICD-10-CM

## 2021-03-29 DIAGNOSIS — J849 Interstitial pulmonary disease, unspecified: Secondary | ICD-10-CM

## 2021-03-29 LAB — PULMONARY FUNCTION TEST
DL/VA % pred: 72 %
DL/VA: 3.04 ml/min/mmHg/L
DLCO unc % pred: 40 %
DLCO unc: 6.95 ml/min/mmHg
FEF 25-75 Pre: 1.05 L/sec
FEF2575-%Pred-Pre: 72 %
FEV1-%Pred-Pre: 67 %
FEV1-Pre: 1.21 L
FEV1FVC-%Pred-Pre: 105 %
FEV6-%Pred-Pre: 67 %
FEV6-Pre: 1.54 L
FEV6FVC-%Pred-Pre: 105 %
FVC-%Pred-Pre: 63 %
FVC-Pre: 1.54 L
Pre FEV1/FVC ratio: 79 %
Pre FEV6/FVC Ratio: 100 %

## 2021-03-29 NOTE — Research (Signed)
Disclaimer: This blurb is a brief overview of the study this patient is participating in. It is for the use of providers caring for the patient. It is not a regulatory declaration, and may or may not reflect all elements of the protocol.  Title:  A Randomized, double-blind, placebo-controlled dose escalation and verification clinical study, to assess the safety and efficacy of pulsed, inhaled nitric oxide (iNO) in subjects at risk of pulmonary hypertension associated with pulmonary fibrosis on long term oxygen therapy (Part 1 and Part 2). Primary Endpoint: The placebo-corrected change for INOpulse in minutes of moderate to vigorous physical activity (MVPA) measured by actigraphy from baseline to month 4.  Protocol Version for  Amendment 7 dated 25Aug 2021 Consent Version for  cohort 3 approved 02Dec2021 Investigator Brochure Version 11.0 approved 02 Mar 2020  Duration of treatment: Study participants will receive iNO 45 mcg/kg IBW/hr versus placebo for 4 months (16 weeks) during Part 1. Then in Part 2: Open Label Extension (OLE) Study participants will be offered open label therapy at iNO 45 mcg/kg IBW/hr after completing the Part 1.  Protocol #: [id number]-001 (REBUILD), ClinicalTrials.gov Identifier: [id number], **Sponsor is [employer], [place], [place] [address])   Key Inclusion Criteria:   Diagnosed with pulmonary fibrosis (all types except sarcoid) by a high resolution CT scan performed in the 6 months prior to screening  Age between 39 and 62 years (inclusive) Historical right heart catheterization (within 3 years) or current Echocardiogram (within 3 months) as assessed by the Investigator/Radiologist confirming low, or intermediate / high probability of pulmonary hypertension Have been using oxygen therapy (at rest or only with exertion; and ? 10 L/min of oxygen supplementation) by nasal cannula for at least 4 weeks prior to the screening run-in period 6MWD ? 100  meters and ? 400 meters at screening and Baseline/Randomization visits. Forced Vital Capacity ? 40% predicted within the last  6 months prior to the screening run-in period. WHO Functional Class II-IV    Key Exclusion Criteria:   Demonstrate symptomatic rebound defined as significant cardiopulmonary instability, such as hypoxemia, bradycardia, tachycardia, systemic hypotension, shortness of breath, near-syncope, and syncope, occurring within 1 hour of acute iNO withdrawal during rebound testing Use of a prostacyclin analogue, guanylate cyclase stimulator, or endothelin-receptor antagonist (ERA) PAH-specific medications regardless of reason for use, except for phospho-diesterase type-5 (PDE5) inhibitors. (The use of PDE5 inhibitors regardless of reason for use is allowed as long as the dose has been stable for at least 3 months prior to screening and there are no plans to adjust the dose during the study) The presence of emphysema unless the extent of fibrotic changes is greater than the extent of emphysema on the most recent HRCT scan.   Chronic use of a nitric oxide donor agent such as nitroglycerin or drugs known to increase methemoglobin such as lidocaine, prilocaine, benzocaine, nitroprusside, isosorbide, or dapsone at Screening Systolic heart failure) with an ejection fraction of < 40%; or severe HF with preserved EF (HFpEF; diastolic HF) as assessed by the Principal Investigator. Smoking within 3 months of Screening and/or unwilling to avoid smoking throughout the study Life expectancy of < 6 months,      Pharmacodynamics:  Nitric oxide is a compound produced by many cells of the body. It relaxes vascular smooth muscle by binding to the heme moiety of cytosolic guanylate cyclase, activating guanylate cyclase and increasing intracellular levels of cGMP, which then leads to vasodilation. When inhaled, NO produces pulmonary vasodilation.  There is no measurable effect  on systemic arterial  pressure.   Pharmacokinetics:  Absorption:  The extent  of absorption of NO was independent of dose and ranged from 85% to 89% with normal respiration and 91% to 93% with maximum respiration. Elimination:  The average elimination half-life of 15N nitrate from plasma ranged from 5.5 hours to 9.8 hours.  Urinary excretion was the primary route of elimination. Nitrate was the predominant metabolite in the urine.   Contraindications:  No contraindications are known for iNO when administered to participants with PAH, ILD, or PH-COPD on LTOT.   Interactions:  Inhaled NO has been demonstrated to potentiate the effect of lowering PAP when used with vasodilators to treat PAH. Currently approved treatments include phosphodiesterase type 5 (PDE-5) inhibitors (); prostacyclin analogs and receptor agonists (; and endothelin receptor antagonists (ERAs).   Safety Data:  Pulsed iNO/ INOpulse was well tolerated. . The most frequently occurring AE, epistaxis, occurred in similar proportions of study participants in  the 3 dose cohorts (~26-27%).  Dyspnea and peripheral edema  occurred more frequently in the 2 iNO dose cohorts than in the placebo cohort.      Based upon clinical studies, the following events are considered adverse reactions:  Methemoglobinemia  Pulmonary edema associated with pre-existing LVD  Signs or symptoms of symptomatic rebound associated with acute withdrawal of iNO therapy, i.e., within 1 hour of withdrawal of therapy, including: significant cardiopulmonary instability such as systemic arterial O2 desaturation, hypoxemia, bradycardia, tachycardia, systemic hypotension, shortness of breath, near- syncope, syncope, ventricular fibrillation, or cardiac arrest.  [facility] Coordinator note :   This visit for [name] with DOB: 11-17-1945 on 03/29/2021 for the above protocol is Visit/Encounter #OLE Week 8 and is for purpose of research.   Subject  expressed continued interest and consent in continuing as a study subject. Subject confirmed that there was no change in contact information (e.g. address, telephone, email). Subject thanked for participation in research and contribution to science.    The Subject was informed that the PI [name] continues to have oversight of the subject's visits and course  through relevant discussions, reviews and also specifically of this visit by routing of this note to the [place].   During this visit on 03/29/2021, the subject completed all visit procedures per the above listed protocol.  For complete details, please see the subject's source binder.    Signed by  [place] Coordinator [facility]  [place], [place] 4:11 PM 03/29/2021

## 2021-04-19 ENCOUNTER — Telehealth: Payer: Self-pay | Admitting: Internal Medicine

## 2021-04-19 DIAGNOSIS — J849 Interstitial pulmonary disease, unspecified: Secondary | ICD-10-CM

## 2021-04-19 MED ORDER — PIRFENIDONE 801 MG PO TABS: 801.0000 mg | ORAL_TABLET | Freq: Three times a day (TID) | ORAL | 1 refills | Status: DC

## 2021-04-19 NOTE — Telephone Encounter (Signed)
Refill for Esbriet 861m three times daily sent to [facility] pharmacy. Patient is a research patient and had office visit with Dr. RChase Calleron 10/15/20. Next f/I visit is 04/27/21 with Dr. RLysbeth Galas PharmD, MPH, BCPS Clinical Pharmacist (Rheumatology and Pulmonology)

## 2021-04-27 ENCOUNTER — Other Ambulatory Visit: Payer: Self-pay

## 2021-04-27 ENCOUNTER — Encounter: Payer: Self-pay | Admitting: Internal Medicine

## 2021-04-27 ENCOUNTER — Ambulatory Visit (INDEPENDENT_AMBULATORY_CARE_PROVIDER_SITE_OTHER): Payer: Medicare Other | Admitting: Internal Medicine

## 2021-04-27 VITALS — BP 118/64 | HR 79 | Temp 98.4°F | Ht 60.5 in | Wt 210.4 lb

## 2021-04-27 DIAGNOSIS — Z8709 Personal history of other diseases of the respiratory system: Secondary | ICD-10-CM | POA: Diagnosis not present

## 2021-04-27 DIAGNOSIS — J329 Chronic sinusitis, unspecified: Secondary | ICD-10-CM | POA: Diagnosis not present

## 2021-04-27 DIAGNOSIS — J849 Interstitial pulmonary disease, unspecified: Secondary | ICD-10-CM | POA: Diagnosis not present

## 2021-04-27 DIAGNOSIS — I2510 Atherosclerotic heart disease of native coronary artery without angina pectoris: Secondary | ICD-10-CM

## 2021-04-27 LAB — CBC WITH DIFFERENTIAL/PLATELET
Basophils Absolute: 0 10*3/uL (ref 0.0–0.1)
Basophils Relative: 0.6 % (ref 0.0–3.0)
Eosinophils Absolute: 0.5 10*3/uL (ref 0.0–0.7)
Eosinophils Relative: 6.1 % — ABNORMAL HIGH (ref 0.0–5.0)
HCT: 36.6 % (ref 36.0–46.0)
Hemoglobin: 11.4 g/dL — ABNORMAL LOW (ref 12.0–15.0)
Lymphocytes Relative: 8.7 % — ABNORMAL LOW (ref 12.0–46.0)
Lymphs Abs: 0.6 10*3/uL — ABNORMAL LOW (ref 0.7–4.0)
MCHC: 31.2 g/dL (ref 30.0–36.0)
MCV: 98.8 fl (ref 78.0–100.0)
Monocytes Absolute: 0.6 10*3/uL (ref 0.1–1.0)
Monocytes Relative: 8.8 % (ref 3.0–12.0)
Neutro Abs: 5.6 10*3/uL (ref 1.4–7.7)
Neutrophils Relative %: 75.8 % (ref 43.0–77.0)
Platelets: 214 10*3/uL (ref 150.0–400.0)
RBC: 3.71 Mil/uL — ABNORMAL LOW (ref 3.87–5.11)
RDW: 14.7 % (ref 11.5–15.5)
WBC: 7.4 10*3/uL (ref 4.0–10.5)

## 2021-04-27 LAB — SEDIMENTATION RATE: Sed Rate: 7 mm/hr (ref 0–30)

## 2021-04-27 LAB — NITRIC OXIDE: Nitric Oxide: 139

## 2021-04-27 MED ORDER — DOXYCYCLINE HYCLATE 100 MG PO TABS: 100.0000 mg | ORAL_TABLET | Freq: Two times a day (BID) | ORAL | 0 refills | Status: DC

## 2021-04-27 MED ORDER — PREDNISONE 10 MG PO TABS: ORAL_TABLET | ORAL | 0 refills | Status: AC

## 2021-04-27 NOTE — Patient Instructions (Addendum)
Chronic respiratory failure with hypoxia (HCC) Interstitial lung disease due to connective tissue disease (HCC) High risk medication use Therapeutic drug monitoring REsearch subject - Pulse iNO study - randomized 10/13/20 and rolled over open label 03/29/21  -Cointued Progressive interstitial lung disease.  Currently on maximal therapy. Now needing 5L [place] rest - Plan ---Continue using oxygen 4 L at rest and 5- 8 L with exertion  -Monitor pulse ox with exertion and keep it over 86% -Continue participation in ILD-pro research registry -Continue pirfenidone =Continue iNO pulsed device per study protocol - get HRCT chest (last sept 2021)  Obstructive sleep apnea  Plan -Continue CPAP  Chrnonic persistnet sinusitis - x 6 weeks    - too bad is recurring every fall/winter and now ongoing x 6 weeks despite antibiotic and predisone course  Plan - continue regular flonase and netti pot and atrovent nasal spray - Do CT sinus without contrast  - Do CBC with diff, IgE and RAST profile , sed rate, ANCA, MPO, PR-3 04/27/2021 (before you start prednisone) - Do feno test - Take doxycycline 15m po twice daily x 7 days; take after meals and avoid sunlight - Please take Take prednisone 479monce daily x 2 days, then 3051mnce daily x 2 days, then 46m34mce daily x 2 days, then prednisone 10mg6me daily  x 2 days and then baseline 5 mg per day to continue   Asthma - not in flare up but at risk due to abve - feno test elevated 139 ppb 04/27/2021  but could be due to study drug -   Plan  - continue trelegy scheduled  - use alb as needed - tests as above for sinus  Followup - telephone visit in 2-3 weeks (or video) to discus results  - research visit per protocol  - make Standard of care visit in 3 months - 30 min - we can cancel v keep it if needed

## 2021-04-27 NOTE — Progress Notes (Signed)
05/21/2018- 75 year old female never smoker followed for allergic rhinitis, rhinosinusitis, asthmatic bronchitis, ILD, OSA,complicated by GERD/Crohn's, Rheumatoid arthritis/prednisone -----4 month follow up for asthma and OSA. Per patient she is still having issues with SOB. She has had 2 foot surgeries since her last visit. States that the [name] is working but [name] wanted to switch her to something else. Unsure of the medication name and I did not see it in his last OV.  Singulair, and Anoro, neb duoneb CPAP auto 5-15/Advanced   Download 100% compliance AHI 1.3/hour. I discussed FENO and she will try Symbicort. Discussed inhaled steroids.  We need to get her a new AeroChamber. Has been on sustained antibiotics for failed appliance in her foot. Notices some runny nose and scant bland mucus.  Wheezing has been well controlled.  Little cough.  She and her husband recognize inability to get any exercise as a substantial reason for dyspnea.  No acute events.  Cardiology continues to follow, including her pulmonary hypertension.  We reviewed her most recent chest CT and discussed implications of progressive interstitial fibrosis.  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  I would like to refer her to [name] for his opinion and advice. FENO 03/15/18  49 H CT chest Hi Res 02/02/2018- IMPRESSION: 1. Spectrum of findings compatible with fibrotic interstitial lung disease with a slight basilar predominance, with mild progression since 2017. 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 2. Mild patchy air trapping, unchanged, indicative of small airways disease. 3. Left main and two-vessel coronary atherosclerosis. 4. Stable dilated main pulmonary artery, suggesting chronic pulmonary arterial hypertension. Aortic Atherosclerosis (ICD10-I70.0).  06/08/18-  75 year old female never smoker followed for allergic rhinitis, rhinosinusitis, asthmatic bronchitis, ILD, OSA,complicated by GERD/Crohn's, Rheumatoid arthritis/prednisone -----coughing for 2 weeks, yellow, green mucus, SOB  Singulair, Atrovent nasal spray, Symbicort 160, and Anoro, neb albuterol, neb DuoNeb, Complains of increased cough with chest congestion, green/yellow sputum, low-grade fever 99 degrees over the last 2 weeks.  Not on prednisone now.  Last antibiotic in October. Pending ILD consultation with [name] in December.   07/02/2018  - Visit   75 year old female patient presenting today for acute visit.  Patient was recently treated with a course of Augmentin and reports sputum color is now light green but has decreased in sputum amount since last office visit.  Patient does not feel she is getting better.  Patient remains adherent to Anoro Ellipta.  On arrival to our office today patient was short of breath and oxygen saturations were 81%.  With deep breathing patient was able to increase oxygenation to 85%.  When placed on 2 L O2 patient was able to maintain at 91%.  Patient had previously been on oxygen in 2015 but this was later [facility].  Patient's weight has also increased over the previous office visits, patient remains adherent to diuretic treatment plan as on taking 1 diuretic daily.  Patient reports she is allowed to take an additional diuretic when she feels necessary.  Unfortunately patient has not been weighing herself regularly there is no set plan to when she should take her second diuretic.  Patient is exceptionally sedentary at home and rarely gets out of her bed or lazy boy chair which is about 3 feet from her bed.  Patient's husband takes care of patient completely including getting drinks and food so patient does not have to leave her lazy boy  recliner.  Patient watches many television shows and does not report much activity.   Patient has completed follow-up with  rheumatology last week and no changes to her medications were made.  Patient reports that she was stable at the time.  CPAP compliance report showing 30 out of 30 days used.  All 30 those days greater than 4 hours.  APAP settings 5-15.  Average usage days 7 hours and 56 minutes.  AHI 0.8.    OV 07/31/2018 -referred to interstitial lung disease clinic  Subjective:  [name], female , DOB: 1945-11-17 , age 70 y.o. , MRN: [id number] , ADDRESS: [address] [place] [address]   07/31/2018 -   Chief Complaint  Patient presents with   Follow-up    Pt of [name] that stated to be referred to ILD clinic.  Pt currently has complaints of wheezing which she has had since December 2019, worsening SOB when moving around and also states she has a pain in left side of chest near shoulder. Pt wears between 2-4L O2 but mainly has been wearing 4L at home.      HPI [name] 75 y.o. -accompanied by her husband.  Referred by [name] pulmonologist in our practice for evaluation of interstitial lung disease in the setting of rheumatoid arthritis.  History is gained from her, talking to her husband, review of recent office visits and also the interstitial lung disease questionnaire.  As best as I can gather   [name] ILD Questionnaire  Symptoms: She is known to have ILD on a CT chest in 2017 but she is not aware of it.  She says that she follows normally for sleep apnea and asthma with [name].  She recollects that approximately 11 or 12 years ago she was hospitalized for pneumonia and was on oxygen for short while not otherwise specified.  Then came off oxygen.  After that overall she is been stable and sedentary using her CPAP.  She is mostly sedentary because of obesity, rheumatoid arthritis and also because of the nonhealing ulcer in her feet for which she is been advised sedentary lifestyle to enable healing.  Most recently to  Thanksgiving 2019 she was seen for respiratory exacerbation and given antibiotics and prednisone.  She followed up mid December 2019 and was found to have new onset hypoxemia 85% on room air.  She was then discharged on portable oxygen which she is using 4 L nasal cannula at rest.  She tells me that at home when she comes off of CPAP she is in her 67s on room air at rest.  In fact in the office today she is 83% on room air at rest but 93% on 2 L nasal cannula at rest.  There is concerned that she has worsening ILD particularly summer 2019 on her CT chest had progressive findings compared to 2 years earlier.  The presumption is that this ILD is caused by rheumatoid arthritis.  In the 2019 CT chest CT is reported as probable UIP but in my personal visualization is either indeterminate or alternative diagnosis.  In terms of symptoms: She reports insidious onset of shortness of breath for some years and gradually getting worse.  Level 2 dyspnea at rest.  Level for dyspnea taking a shower.  Level 5 dyspnea walking at her own pace of walking with others of her age and walking up stairs or walking up a hill.  She does have  a cough with yellow and green sputum.  She does cough at night.  There is also wheezing  Past Medical History : She has longstanding history of rheumatoid arthritis since the 1980s.  Used to be followed by Dr. Danie Binder.  As best as I can gather she used to be on methotrexate maybe 10 or 20 years ago.  She took it for a short while and then stopped it because of diarrhea.  She took this for less than 1 year.  She believes that after that she was basically on pain management.  She is only been on prednisone chronic intake for a total of 1 year approximately 4 to 5 years ago.  Around this time Dr. Hoyt Koch retired and she started seeing Dr. Gavin Pound.  Since then she is been on Arava and Plaquenil.  She has never seen any other immunomodulators in the setting of rheumatoid arthritis  according to history.  Overall the rheumatoid arthritis has caused some disability with deformed joints in her hands and also her using cane.    She is not on any anticoagulation is never had any blood clots.  In 2017 she had vascular extremity duplex venous that was negative for DVT.  Most recently she is been dealing with nonhealing ulcer of the left toe/metatarsal .  She had August and September 2018.  And then 1 in the latter part of 2019.  Most recently she says she has had a duplex lower extremity venous.  I do not have access to these results.  She is waiting on this.  Was done in the last few to several days.   She also has a longstanding history of asthma for which she is on Singulair.  Exam nitric oxide was elevated in August 2019 at 49 ppb but 07/31/2018 I snormal in the 20s though she is wheezing   She has nonobstructive coronary artery disease and in March 2018 had a right heart catheterization with elevated pulmonary pressures but also slightly elevated wedge pressure [see below].  She is started on Lasix  She has sleep apnea for which she uses CPAP.  She has obesity  ROS: Positive for arthralgia and arthritis.  Dry eyes and dry mouth but otherwise negative   FAMILY HISTORY of LUNG DISEASE: Denies   EXPOSURE HISTORY: Never smoked any cigarettes, marijuana, vaping, cocaine or intravenous drug use   HOME and HOBBY DETAILS : [place] lived there for 8 years.  No mold.  No dampness.  Does use humidifier and does use CPAP but there is no mold in it.  Also uses nebulizer machine but there is no mold.  No pet birds no musical instruments no guarding habits   OCCUPATIONAL HISTORY (122 questions) : Denies   PULMONARY TOXICITY HISTORY (27 items): She has taken nitrofurantoin 9 years ago.  Has not taken methotrexate for a year 5 years ago and is taken sulfasalazine       Results for Hailey, [name] (MRN [id number]) as of 07/31/2018 16:31  Ref. Range  09/03/2015 12:45 02/21/2017  FVC-Pre Latest Units: L 2.18 1.7L  FVC-%Pred-Pre Latest Units: % 84 66%   Results for JANIRA, MANDELL (MRN [id number]) as of 07/31/2018 16:31  Ref. Range 09/03/2015 12:45  DLCO cor Latest Units: ml/min/mmHg 14.06  DLCO cor % pred Latest Units: % 69   HEART CATH MARCH 2018 Right Heart Pressures RA (mean): 13 mmHg RV (S/EDP): 38/12 mmHg PA (S/D, mean): 43/23 (32) mmHg PCWP (mean): 16 mmHg  Conclusions: Mild to moderate, non-obstructive coronary artery disease with 50% mid LAD and 20% proximal RCA stenoses. Mild to moderate pulmonary hypertension with mildly elevated right heart filling pressures. Upper normal left heart filling pressures. Normal Fick cardiac output/index. Equalization of end-diastolic pressures with ventricular concordance. This can be seen in the setting of restrictive process.   Recommendations: Escalate statin therapy to prevent progression of CAD. Increase furosemide to 40 mg BID and KCl to 40 mg BID. Patient to have BMP in 1 week to evaluate renal function and potassium. Continue outpatient pulmonary follow-up. I suspect underlying lung disease is the driving force behind the patient's dyspnea, pulmonary hypertension, and elevated right heart pressures. Outpatient follow-up in cardiology clinic in ~6 weeks.   Nelva Bush, MD   CT chest high res July 2019 - visualized personally - personaly opinion - indeterminate or alt dx for UIP but agree with progression  IMPRESSION: Lungs/Pleura: No pneumothorax. No pleural effusion. No acute consolidative airspace disease or lung masses. Few scattered small solid pulmonary nodules in mid to upper right lung, largest 4 mm in the right upper lobe (series 3/image 46), unchanged since 03/14/2016 CT, considered benign. No new significant pulmonary nodules. Mild patchy air trapping in both lungs on the expiration sequence, unchanged. Patchy peribronchovascular and subpleural reticulation and  ground-glass attenuation throughout both lungs with associated mild traction bronchiectasis and mild architectural distortion. There is a slight basilar gradient to these findings. Tiny focus of honeycombing in anterior right upper lobe (series 8/image 59). These findings have mildly worsened since 03/14/2016 chest CT. 1. Spectrum of findings compatible with fibrotic interstitial lung disease with a slight basilar predominance, with mild progression since 2017. 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 2. Mild patchy air trapping, unchanged, indicative of small airways disease. 3. Left main and two-vessel coronary atherosclerosis. 4. Stable dilated main pulmonary artery, suggesting chronic pulmonary arterial hypertension.   Aortic Atherosclerosis (ICD10-I70.0).     Electronically Signed   By: Ilona Sorrel M.D.   On: 02/02/2018 14:03        OV 03/15/2019  Subjective:  [name], female , DOB: [birth year] , age 61 y.o. , MRN: [id number] , ADDRESS: [address] [place] [address]   03/15/2019 -   Chief Complaint  Patient presents with   IPD (Interstitial pulmonary disease)    Breathing is doing better since being on Esbriet. Has productive cough with green phelgm. Has been on antibiotic for last 6 weeks. Had hip replacement in June.     HPI [name] 75 y.o. 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  Started on Esbriet in March 2020.  Has progressive phenotype.   She presents for routine follow-up with her husband.  She tells me that ever since starting Esbriet in the spring 2020 she has been doing well.  She does not feel that the disease has progressed.  In fact she feels somewhat better.  She continues to use 4 L of oxygen.  She is now taking the Esbriet at 1 pill 3 times daily.  This is the  full dose of 1 pill.  Her last pulmonary function test was in early 2020.  It has been a while since she did her liver function test and she requires a repeat of that today.  She is willing to have her flu shot today.  She is asking for prescription for  emergency electric generator.  She does not know about the [employer] energy assistance program.  She has a handicap sticker.  She is interested in the ILD-PRO research registry study.    She is on Trelegy inhaler and Singulair I am not fully clear why   In June 2020 she had hip replacement for stress fracture and was on 6 weeks of Keflex.  She is slowly improving.  She uses a walker.  OV 05/17/2019  Subjective:  [name], female , DOB: 04-28-1946 , age 72 y.o. , MRN: [id number] , ADDRESS: [address] [place] [address]   05/17/2019 -   Chief Complaint  Patient presents with   Follow-up    Patient reports that she has had a productive cough with yellow sputum and her PCP placed her on an antibiotic.    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  Started on Esbriet in March 2020.  Has progressive phenotype. COVID negative 04/25/2019,. On 02 4L [place] since end 2019  Also hx of asthma  Also hx of chronic sinusitis - 2017 CT maxillary sinusits - DR Wilburn Cornelia  History of sleep apnea on CPAP  HPI [name] 75 y.o. -presents to the ILD clinic for the above issues.  She continues on pirfenidone since March 2020.  She says this is helping her.  No adverse effects.  Last liver function test was August 2020.  She needs another liver function test right now.  No nausea vomiting or diarrhea or weight loss.  She continues on 4 L of oxygen continuously 24/7.  At night she uses CPAP for her sleep apnea.  She and her husband think that she needs 6 L of oxygen at night and they are trying to increase the oxygen.  Although this is not based on data but on subjective reasoning.     New issue associated with sinus issues: Her main issue today is that for the last 3-week she has had worsening of her cough and there is associated yellow sputum.  Her COVID-19 test was -2 days ago.  This been going on for 3 weeks.  She tells me this happens to her every winter.  She is complaining of chronic postnasal drainage.  In 2017 she had a CT scan of the sinus that showed maxillary sinusitis and saw Dr. Wilburn Cornelia but since then has not followed up.  She does not have a history of sinus surgery.  She is frustrated by the cough and the yellow sputum.  Her pulmonary function test itself is stable since she started the [place].  She is interested in the ILD pro registry study.  In terms of her asthma: She is on Trelegy and Singulair.  She does not want to stop this.  There is a clinical history.  She says that if she stops this it gets worse.   In terms of her sleep apnea: She continues on CPAP with oxygen at night.    ROS    IMPRESSION: CT chest Jan  2020 1. Severe tracheobronchomalacia, more pronounced on today's scan. 2. Interval progression of basilar predominant fibrotic interstitial lung disease with particularly increased ground-glass component. No honeycombing. 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 Findings are consistent with UIP per consensus guidelines: Diagnosis of Idiopathic Pulmonary Fibrosis: An Official ATS/ERS/JRS/ALAT Clinical Practice Guideline. [place], Iss 5, [phone], Mar 18 2017. 3.  Stable dilated main pulmonary artery, suggesting chronic pulmonary arterial hypertension. 4. Left main and 3 vessel coronary atherosclerosis.   Aortic Atherosclerosis (ICD10-I70.0).     Electronically Signed   By: Ilona Sorrel M.D.   On: 08/20/2018 08:21   - per HPI  OV 04/02/2020  Subjective:  [name], female , DOB: 04/07/1946 , age 51 y.o. 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  Started on Esbriet in March 2020.  Has progressive phenotype. COVID negative 04/25/2019,. On 02 4L [place] since end 2019  - on ild pro registry  Also hx of asthma -Singulair and inhaler  Also hx of chronic sinusitis - 2017 CT maxillary sinusits - DR Wilburn Cornelia  History of sleep apnea on CPAP  Rheumatoid arthritis seen by Dr. Gavin Pound on leflunomide, Plaquenil and daily prednisone 5 mg/day.  04/02/2020 -   Chief Complaint  Patient presents with   Follow-up    pt is here to go over pft results     HPI YANESSA HOCEVAR 75 y.o. -presents for the above issues of interstitial lung disease in the setting of connective tissue disease.  She is on leflunomide and Plaquenil and prednisone with her rheumatologist.  She is on pirfenidone for her ILD.  I personally last saw her almost a year ago in October 2020.  After that she is seen nurse practitioner.  In the interim she says she is overall stable with the symptom score show a different story she has had significant worsening of her symptoms.  She continues to be morbidly obese.  Walking desaturation test she is desaturating to the point she is requiring 6 L to correct.  In fact early in August 2020 [place] and was extremely short of breath.  Even at rest on 3 L she was 89%.  She continues to use a CPAP.  Her husband who is here with her today would like her to lose weight in order to help her health status.  Pulmonary function test shows also a decline.  Her last CT scan of the chest was in June 2020    [place] 05/13/2020   Subjective:  [name], female , DOB: 1946-07-11, age 27 y.o. years. 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    - Started on Esbriet in March 2020.  Has progressive phenotype. COVID negative 04/25/2019,. On 02 4L [place] since end 2019  - on ild pro registry  Also hx of asthma -Singulair and inhaler  Also hx of chronic sinusitis - 2017 CT maxillary sinusits - DR Wilburn Cornelia  History of sleep apnea on CPAP  Rheumatoid arthritis seen by Dr. Gavin Pound on leflunomide, Plaquenil and daily prednisone 5 mg/day.  Elevated pulmonary artery pressures but PVR less than 3 in 2018: Right Heart Pressures RA (mean): 13 mmHg RV (S/EDP): 38/12 mmHg PA (S/D, mean): 43/23 (32) mmHg PCWP (mean): 16 mmHg  Ao sat: 94% PA sat: 70% RA sat: 75%  Fick CO: 6.4 L/min Fick  CI: 3.7 L/min/m^2  PVR: 2.5 [place] units     Chief Complaint  Patient presents with   Follow-up    more sob walking shorter distances.  coughing up yellow phlegm.       HPI [name] 75 y.o. -last visit September 2021.  At that time concern for progressive ILD.  Therefore we decided to PFTs and also high-resolution CT chest.  The high-resolution CT chest compared to last year shows stability.  The PFTs show stability within this year but progression compared to last year.  She is also lost weight following a low carbohydrate diet.  Nevertheless she feels the weight of her symptoms.  She is also using oxygen with exertion.  She feels this is getting worse.  Although her overall symptom burden seems to be improved with the weight loss.  Early morning she says there are some yellow phlegm which is stable for many many years.  This is mostly sinus.  There is no change in this.  She continues on immunosuppressants and antifibrotic.  There is no  side effects from these.  She is willing to have labs checked.  She has had a Covid vaccine including booster.  She does not know if she has antibody response yet.  She is willing to get this checked.  She is looking at options to get better.  She will participate in the ILD-pro registry visit today.  Review of the records indicate the last right eye catheterization was in 2018.  Pressures were borderline at that time.  She is willing to get this checked.  Her cardiologist is in [place].  She wants to get the procedure done in [place] and is willing to see Dr. Haroldine Laws or Dr. Aundra Dubin who are well versed with right heart catheterization locally.    Wt Readings from Last 3 Encounters:  05/13/20 202 lb 12.8 oz (92 kg)  04/02/20 207 lb 9.6 oz (94.2 kg)  02/05/20 218 lb (98.9 kg)       TECHNIQUE: CT chest sept 2021 Multidetector CT imaging of the chest was performed following the standard protocol without intravenous contrast. High resolution imaging of the lungs, as well as inspiratory and expiratory imaging, was performed.   COMPARISON:  08/16/2018, 02/02/2018, 03/14/2016, 08/27/2015   FINDINGS: Cardiovascular: Aortic atherosclerosis. Normal heart size. Three-vessel coronary artery calcifications. No pericardial effusion.   Mediastinum/Nodes: No enlarged mediastinal, hilar, or axillary lymph nodes. Thyroid gland, trachea, and esophagus demonstrate no significant findings.   Lungs/Pleura: Unchanged moderate pulmonary fibrosis in a pattern with apical to basal gradient featuring irregular peripheral interstitial opacity, septal thickening, ground-glass, and minimal tubular bronchiectasis without evidence of bronchiolectasis or honeycombing. Fibrotic findings are not significantly changed compared to immediate prior examination but significantly worsened over time on examinations dating back to 08/27/2015 occasional stable, definitively benign small pulmonary nodules, for  example a 3 mm nodule of the anterior right upper lobe (series 10, image 77). No significant air trapping on expiratory phase imaging. No pleural effusion or pneumothorax.   Upper Abdomen: No acute abnormality.   Musculoskeletal: No chest wall mass or suspicious bone lesions identified.   IMPRESSION: 1. Unchanged moderate pulmonary fibrosis in a pattern with apical to basal gradient featuring irregular peripheral interstitial opacity, septal thickening, ground-glass, and minimal tubular bronchiectasis without evidence of bronchiolectasis or honeycombing. Fibrotic findings are not significantly changed compared to immediate prior examination but significantly worsened over time on examinations dating back to occasional 08/27/2015. Findings remain consistent with a "probable UIP" pattern by  pulmonary fibrosis criteria and generally in keeping with reported history of rheumatoid arthritis and associated fibrotic interstitial lung disease. 2. Coronary artery disease.  Aortic Atherosclerosis (ICD10-I70.0).     Electronically Signed   By: Eddie Candle M.D.   On: 04/15/2020 15:35     OV 08/19/2020  Subjective:  [name], female , DOB: 1946-01-21 , age 67 y.o. 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    - Started on Esbriet in March 2020.  Has progressive phenotype. COVID negative 04/25/2019,. On 02 4L [place] since end 2019  - on ild pro  registry  Also hx of asthma -Singulair and inhaler  Also hx of chronic sinusitis - 2017 CT maxillary sinusits - DR Wilburn Cornelia  History of sleep apnea on CPAP  Rheumatoid arthritis seen by Dr. Gavin Pound on leflunomide, Plaquenil and daily prednisone 5 mg/day.    HPI [name] 74 y.o. -presents with her husband.  Last seen in October 2021.  After that in January 2022 she was supposed to have a right heart catheterization because of concern of progressive ILD and her having autoimmune phenotype.  However middle of January 2022 she got sick with bronchitis.  She and her husband say that she gets this sick every year.  They do not know if it was Covid because she did not get tested.  She says primary care physician treated her with 10 days of 20 mg/day prednisone [at baseline she is on 5 mg/day of].  And also doxycycline for 10 days.  This is not helped.  Therefore she is now on amoxicillin and a second course of prednisone 20 mg/day for 10 days.  Her baseline dose is 5 mg prednisone.  She is not feeling better.  In fact her symptom scores are much worse.  Her pulse ox on room air at rest was 88%.  Previously room air at rest was fine.  She is frustrated by her symptoms.  She and her husband are frustrated by the symptom burden.  She is requesting Phenergan with codeine for cough control.     OV 09/03/2020  Subjective:  [name], female , DOB: 1945/11/08 , age 22 y.o. , MRN: [id number] , ADDRESS: [address] [place] [address] PCP Lorene Dy, MD Patient Care Team: Lorene Dy, MD as PCP - General (Internal Medicine) End, Harrell Gave, MD as PCP - Cardiology (Cardiology) Gavin Pound, MD as Consulting Physician (Rheumatology)  This Provider for this visit: Treatment Team:  Attending Provider: Brand Males, MD  Type of visit: Telephone/Video Circumstance: COVID-19 national emergency Identification of patient [name] with 10-22-1945  and MRN [id number] - 2 person identifier Risks: Risks, benefits, limitations of telephone visit explained. Patient understood and verbalized agreement to proceed Anyone else on call: just patient Patient location: [phone] This provider location: [place]   09/03/2020 -  No chief complaint on file.   -interstitial lung disease pulmonary fibrosis UIP pattern  secondary to rheumatoid arthritis.    - Started on Esbriet in March 2020.  Has progressive phenotype. COVID negative 04/25/2019,. On 02 4L [place] since end 2019   - on ild pro registry  Also hx of asthma -Singulair and inhaler  Also hx of chronic sinusitis - 2017 CT maxillary sinusits - DR Wilburn Cornelia  History of sleep apnea on CPAP  Rheumatoid arthritis seen by Dr. Gavin Pound on leflunomide, Plaquenil and daily prednisone 5 mg/day.  Positive Covid IgG - Feb 2022   HPI LATANYA HEMMER 75 y.o. -this telephone visit this telephone visit is to follow-up from recent exacerbation early February 2022.  She is finished her prednisone she is feeling back to baseline.  She says her sinuses are congested but she will follow-up with the ENT.  She is still waiting for a right heart catheterization.  She has follow-up later in March 2022 with me.  Oxygen use is baseline.  She had Covid IgG checked and this was normal.  She continues to use a CPAP.  She is regular with all her medications.  She took consent form for PULSE inhaled nitric oxide study and she is interested.  Recent lab review shows slightly high calcium.  She does not know that she has this problem    PFT   OV 10/15/2020  Subjective:  [name], female , DOB: December 31, 1945 , age 81 y.o. , MRN: [id number] , AD[address]ESS: [address] [place] [address] PCP Lorene Dy, MD Patient Care Team: Lorene Dy, MD as PCP - General (Internal Medicine) End, Harrell Gave, MD as PCP - Cardiology (Cardiology) Gavin Pound, MD as Consulting Physician  (Rheumatology)  This Provider for this visit: Treatment Team:  Attending Provider: Brand Males, MD    10/15/2020 -   Chief Complaint  Patient presents with   Follow-up    She felt she was breathing deeper before pft.      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      - Started on Esbriet in March 2020.  Has progressive phenotype. COVID negative 04/25/2019,. On 02 4L [place] since end 2019   - on ild pro registry  - last CT Sept 2021  - started PULSE iNO study march 2022 via study protocol -randomized trial  Also hx of asthma -Singulair and inhaler trelegy  Also hx of chronic sinusitis - 2017 CT maxillary sinusits - DR Wilburn Cornelia  History of sleep apnea on CPAP  Rheumatoid arthritis seen by Dr. Gavin Pound on leflunomide, Plaquenil and daily prednisone 5 mg/day.  Positive Covid IgG - Feb 2022   HPI [name] 75 y.o. -presents for standard of care visit.  She is now on inhaled nitric oxide study.  The inhaled nitric oxide is being bled through her oxygen.  She has to wear 12 hours a day.  She was randomized 2 days ago.  She tells me for the last 4 days she is having sinus complaints of yellow sinus drainage and increased cough.  She feels as acute sinusitis and she will benefit from antibiotics and prednisone.  Specifically she did not mention this at the time of randomization.  She denied this at the time of randomization but now she says it was present for 4 days.  She had pulmonary function test that shows continued decline in ILD.  Her oxygen use remains the same at 4 L and with exertion she uses 5 L.  She is on Trelegy for  asthma.  Her CT scan of the chest in September did not show any evidence of emphysema.  She uses CPAP for her sleep apnea.  Labs from research were reviewed by me.  Her most recent hemoglobin is 12.  Creatinine and liver function test was normal.  Calcium is not available.   Of note after starting  the inhaled nitric oxide she feels she can breathe more deeper.  However his symptom score shows continued decline.    OV 04/27/2021  Subjective:  [name], female , DOB: 23-Jun-1946 , age 52 y.o. , MRN: [id number] , ADDRESS: [address] [place] [id number] PCP Lorene Dy, MD Patient Care Team: Lorene Dy, MD as PCP - General (Internal Medicine) End, Harrell Gave, MD as PCP - Cardiology (Cardiology) Gavin Pound, MD as Consulting Physician (Rheumatology)  This Provider for this visit: Treatment Team:  Attending Provider: Brand Males, MD    04/27/2021 -   Chief Complaint  Patient presents with   Follow-up    Pt states she has been sick for about 6 weeks. Has been on prednisone as well as abx and is still not over it. States she is coughing getting up yellow-green phlegm and also has had some wheezing. Also has had increased SOB.      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    - Started on Esbriet in March 2020.  Has progressive phenotype. COVID negative 04/25/2019,. On 02 4L [place] since end 2019   - on ild pro registry  - started PULSE iNO study march 2022 via study protocol -randomized trial -> randomized to Open Label in sept 2022  - [place] 11/09/20   -   RA = 12 RV = 52/14 PA = 55/25 (38) PCW = 19 Fick cardiac output/index = 5.8/3.1 Thermo CO/CI = 7.4/4.0 PVR = 3.3 WU (Fick) 2.6 WU (thermo) FA sat = 99% PA sat = 71%, 71% SVC sat = 76% Given relatively normal PVR may not benefit much from Tyvas - per cads    Also hx of asthma -Singulair and inhaler trelegy  - 0% eoios last checked 2019  Also hx of chronic sinusitis  - 2017 CT maxillary sinusits - DR Wilburn Cornelia  - CT sinus nov 2020 - celar  History of sleep apnea on CPAP  Rheumatoid arthritis seen by Dr. Gavin Pound on leflunomide, Plaquenil and daily prednisone 5 mg/day.  Positive Covid IgG - Feb  2022   HPI [name] 75 y.o. -presents for standard of care visit to the ILD center with [name].  She presents with her husband.  She tells me that since summer 2022 she is having increased sinus drainage.  For the last 6 weeks she is got more cough.  She feels there is a frog in the throat.  She brings yellow drainage from the sinuses.  The drainage increases with Nettie pot saline wash.  Sometimes she is clearing this from the throat.  Her husband says this happens every winter.  2 years ago we did CT scan of the sinus and it was clear.  She has been on 2 antibiotics.  She felt cephalexin and azithromycin did not help.  She prefers to have doxycycline.  She has been on 1 prednisone 10-day taper.  She feels that she wants another course of doxycycline and prednisone.  She also states that if she takes antibiotics she will want Diflucan.  Her last sinus CT was 2 years ago  and her last CT chest was 1 year ago.  She also reports cough and chest tightness.  Worsening shortness of breath.  Despite subjective reporting of worsening shortness of breath subjective objective dyspnea score on the scoring scale looks the same  She continues on antifibrotic pirfenidone.  She is tolerating this well.  She is also on inhaled nitric oxide open label with the oxygen.  Her pulmonary function test shows continued decline.  Because of her symptoms we did a nitric oxide exhaled test and this was high.  Unclear if this because of inhaled nitric oxide.  Research of the study sponsor on this.   SYMPTOM SCALE - ILD 03/15/2019  05/17/2019  04/02/2020  05/13/2020   08/19/2020  10/15/2020  04/27/2021 210#  O2 use x      5L [place] pule  Shortness of Breath 0 -> 5 scale with 5 being worst (score 6 If unable to do)        At rest 0 0 4 0 _0 Simple tasks - showers, clothes change, eating, shaving 0 0 _1 Household (dishes, doing bed, laundry) _2 Shopping _3 Walking level at own pace  _4 Walking up Stairs _5 Total (40 - 48) Dyspnea Score _6 How bad is your cough? _7 0 _8 How bad is your fatigue _9 nauea   0 0 0 0 0  vomit   0 0 0 0 0  diarrhea   _10 anxiety   _11 depression   _12 Simple office walk 185 feet x  3 laps goal with forehead probe 03/15/2019 - uses 4L [place] at home  04/02/2020  05/13/2020 92% on RA, 4L [place] when walking in 91% 08/19/2020 Uses 4L [place] at rest. Was 88% o RA at rest  O2 used Room air Walked on 3L [place]    Number laps completed 1/2 lap ad desat to 88%     Comments about pace      Resting Pulse Ox/HR  97% and 75    Final Pulse Ox/HR  88% and 108 at 2nd laps    Desaturated </= 88%      Desaturated <= 3% points      Got Tachycardic >/= 90/min      Symptoms at end of test      Miscellaneous comments -walked with 4L [place] - uses 4L [place] at home Needed 6L  to correct     Results for Hailey, [name] (MRN [id number]) as of 04/27/2021 11:38  Ref. Range 09/03/2015 12:45 10/01/2018 14:45 05/17/2019 08:59 10/02/2019 10:50 04/01/2020 11:24 09/11/2020 14:06 10/15/2020 09:59 12/08/2020 09:03 02/02/2021 08:12 03/29/2021 14:07  FVC-Pre Latest Units: L 2.18 1.81 1.82 1.62 1.60 1.58 1.47 1.45 2.00 1.54  FVC-%Pred-Pre Latest Units: % _13  Results for EMALYN, SCHOU (MRN [id number]) as of 04/27/2021 11:38  Ref. Range 09/03/2015 12:45 10/01/2018 14:45 05/17/2019 08:59 10/02/2019 10:50 04/01/2020 11:24 09/11/2020 14:06 10/15/2020 09:59 12/08/2020 09:03 02/02/2021 08:12 03/29/2021 14:07  DLCO unc Latest Units: ml/min/mmHg 14.82 11.94 11.28 10.42 11.37 7.59 9.05 9.05 9.22 6.95  DLCO unc % pred Latest Units: % _0   Lab Results  Component Value Date   NITRICOXIDE 139 04/27/2021      has a past medical history of Acute asthmatic bronchitis, Allergic rhinitis, Anxiety, Arthritis, Esophageal reflux, Hypertension, Interstitial lung disease ([place]) (dx jan 2020),  Irritable bowel syndrome, Oxygen dependent, PONV (postoperative nausea and vomiting), Rheumatoid arthritis(714.0), and Sleep apnea.   reports that she has never smoked. She has never used smokeless tobacco.  Past Surgical History:  Procedure Laterality Date    c secttion  1977   2 foot fusions Left    total of 6 left foot sx   ABDOMINAL HYSTERECTOMY     complete    ANKLE FUSION  2009   left   BACK SURGERY     lower l to l 5 fused   CHOLECYSTECTOMY     RIGHT HEART CATH N/A 11/09/2020   Procedure: RIGHT HEART CATH;  Surgeon: Jolaine Artist, MD;  Location: [facility];  Service: Cardiovascular;  Laterality: N/A;   RIGHT/LEFT HEART CATH AND CORONARY ANGIOGRAPHY N/A 09/16/2016   Procedure: Right/Left Heart Cath and Coronary Angiography;  Surgeon: Nelva Bush, MD;  Location: [facility];  Service: Cardiovascular;  Laterality: N/A;   TONSILLECTOMY     TOTAL HIP ARTHROPLASTY Right 12/20/2018   Procedure: TOTAL HIP ARTHROPLASTY ANTERIOR APPROACH;  Surgeon: Paralee Cancel, MD;  Location: [facility];  Service: Orthopedics;  Laterality: Right;  70 mins   TOTAL KNEE ARTHROPLASTY Bilateral     Allergies  Allergen Reactions   Cefdinir Diarrhea   Erythromycin Base Diarrhea   Lactose Dermatitis   Lactulose Diarrhea   Mesalamine Nausea Only   Methotrexate Other (See Comments)    Felt sick   Nitrofuran Derivatives     shakiness   Other Diarrhea and Other (See Comments)    Shaking uncontrollably "lettuce only" "lettuce only"   Sulfa Antibiotics Other (See Comments)    Patient can't recall reaction    Tdap [Tetanus-Diphth-Acell Pertussis]     Shaking uncontrollably   Tetanus Toxoid, Adsorbed Other (See Comments)    Shaking uncontrollable     Immunization History  Administered Date(s) Administered   Fluad Quad(high Dose 65+) 03/15/2019   Influenza Split 03/18/2012, 03/18/2013, 04/14/2014, 04/17/2017   Influenza Whole 04/30/2008, 04/20/2009, 04/18/2011   Influenza, High  Dose Seasonal PF 04/23/2015, 03/24/2016, 04/06/2018, 04/02/2020, 03/24/2021   Influenza,inj,quad, With Preservative 04/06/2018   PFIZER(Purple Top)SARS-COV-2 Vaccination 08/24/2019, 09/18/2019, 04/13/2020   Pneumococcal Conjugate-13 11/11/2013   Pneumococcal Polysaccharide-23 05/21/2018   Zoster Recombinat (Shingrix) 05/05/2017    Family History  Problem Relation Age of Onset   Heart disease Mother    Arthritis Mother    Heart attack Father    Diabetes Other        sibling   Heart attack Other        sibling     Current Outpatient Medications:    albuterol (VENTOLIN HFA) 108 (90 Base) MCG/ACT inhaler, USE 2 INHALATIONS BY MOUTH  EVERY 6 HOURS AS NEEDED (Patient taking differently: Inhale 2 puffs into the lungs every 6 (six) hours as needed for wheezing or shortness of breath.), Disp: 34 g, Rfl: 1   aspirin EC 81 MG tablet, Take 81 mg by mouth daily., Disp: , Rfl:    benzonatate (TESSALON) 200 MG capsule, TAKE 1 CAPSULE BY MOUTH 3 TIMES A DAY AS NEEDED FOR COUGH (Patient taking differently: Take 200 mg by  mouth 3 (three) times daily as needed for cough.), Disp: 30 capsule, Rfl: 3   celecoxib (CELEBREX) 200 MG capsule, Take 200 mg by mouth daily., Disp: , Rfl:    Cholecalciferol (VITAMIN D3) 250 MCG (10000 UT) capsule, Take 10,000 Units by mouth 4 (four) times a week., Disp: , Rfl:    clidinium-chlordiazePOXIDE (LIBRAX) 5-2.5 MG capsule, Take 1 capsule by mouth 3 (three) times daily as needed (for IBS)., Disp: , Rfl:    colestipol (COLESTID) 1 g tablet, Take 2 g by mouth daily., Disp: , Rfl:    dicyclomine (BENTYL) 20 MG tablet, Take 20 mg by mouth 4 (four) times daily as needed for spasms., Disp: , Rfl:    doxycycline (VIBRA-TABS) 100 MG tablet, Take 1 tablet (100 mg total) by mouth 2 (two) times daily., Disp: 14 tablet, Rfl: 0   FLUoxetine (PROZAC) 20 MG capsule, Take 20 mg by mouth daily., Disp: , Rfl:    fluticasone (FLONASE) 50 MCG/ACT nasal spray, SPRAY 2 SPRAYS INTO EACH NOSTRIL  EVERY DAY (Patient taking differently: Place 2 sprays into both nostrils daily.), Disp: 48 mL, Rfl: 3   Fluticasone-Umeclidin-Vilant (TRELEGY ELLIPTA) 100-62.5-25 MCG/INH AEPB, Inhale 1 puff into the lungs at bedtime., Disp: 180 each, Rfl: 2   furosemide (LASIX) 40 MG tablet, TAKE 1 TABLET BY MOUTH EVERY DAY, Disp: 30 tablet, Rfl: 7   ibuprofen (ADVIL) 200 MG tablet, Take 400 mg by mouth 3 (three) times daily., Disp: , Rfl:    ipratropium (ATROVENT) 0.03 % nasal spray, USE 1 TO 2 SPRAYS IN BOTH  NOSTRILS TWICE DAILY, Disp: 90 mL, Rfl: 3   ipratropium-albuterol (DUONEB) 0.5-2.5 (3) MG/3ML SOLN, USE 1 VIAL IN NEBULIZER EVERY 6 HOURS AS NEEDED *J45.901* npi [id number] (Patient taking differently: Inhale 3 mLs into the lungs 3 (three) times daily as needed (Shortness of breath). *J45.901* npi [id number]), Disp: 270 mL, Rfl: 11   KLOR-CON M20 20 MEQ tablet, TAKE 1 TABLET BY MOUTH TWICE A DAY (Patient taking differently: Take 20 mEq by mouth 2 (two) times daily.), Disp: 180 tablet, Rfl: 3   leflunomide (ARAVA) 20 MG tablet, Take 20 mg by mouth daily. , Disp: , Rfl:    levocetirizine (XYZAL) 5 MG tablet, Take 5 mg by mouth daily., Disp: , Rfl:    methocarbamol (ROBAXIN) 750 MG tablet, Take 750 mg by mouth every 6 (six) hours as needed for muscle spasms., Disp: , Rfl:    montelukast (SINGULAIR) 10 MG tablet, TAKE 1 TABLET BY MOUTH AT  BEDTIME, Disp: 90 tablet, Rfl: 3   omeprazole (PRILOSEC) 40 MG capsule, Take 40 mg by mouth at bedtime. , Disp: , Rfl:    OXYGEN, Inhale 4 mLs into the lungs continuous. , Disp: , Rfl:    Pirfenidone (ESBRIET) 801 MG TABS, Take 801 mg by mouth 3 (three) times daily., Disp: 270 tablet, Rfl: 1   predniSONE (DELTASONE) 10 MG tablet, Take 4 tablets (40 mg total) by mouth daily with breakfast for 2 days, THEN 3 tablets (30 mg total) daily with breakfast for 2 days, THEN 2 tablets (20 mg total) daily with breakfast for 2 days, THEN 1 tablet (10 mg total) daily with breakfast for 2  days., Disp: 20 tablet, Rfl: 0   predniSONE (DELTASONE) 5 MG tablet, Take 5 mg by mouth daily with breakfast., Disp: , Rfl:    rosuvastatin (CRESTOR) 20 MG tablet, TAKE 1 TABLET BY MOUTH  DAILY, Disp: 90 tablet, Rfl: 2   traMADol (ULTRAM) 50 MG tablet, Take  50 mg by mouth 3 (three) times daily as needed for moderate pain., Disp: , Rfl:    traZODone (DESYREL) 50 MG tablet, Take 50 mg by mouth at bedtime., Disp: , Rfl:    verapamil (CALAN-SR) 240 MG CR tablet, TAKE 1 TABLET BY MOUTH  DAILY, Disp: 90 tablet, Rfl: 3   HYDROcodone-acetaminophen (NORCO) 7.5-325 MG tablet, Take 1 tablet by mouth daily as needed for pain. (Patient not taking: Reported on 04/27/2021), Disp: , Rfl:       Objective:   Vitals:   04/27/21 1057  BP: 118/64  Pulse: 79  Temp: 98.4 F (36.9 C)  TempSrc: Oral  SpO2: 98%  Weight: 210 lb 6.4 oz (95.4 kg)  Height: 5' 0.5" (1.537 m)    Estimated body mass index is 40.41 kg/m as calculated from the following:   Height as of this encounter: 5' 0.5" (1.537 m).   Weight as of this encounter: 210 lb 6.4 oz (95.4 kg).  _0 @  Filed Weights   04/27/21 1057  Weight: 210 lb 6.4 oz (95.4 kg)     Physical Exam    General: No distress. obrese Neuro: Alert and Oriented x 3. GCS 15. Speech normal Psych: Pleasant Resp:  Barrel Chest - no.  Wheeze -initially in the upper lobes chest but later this went away, Crackles -bilateral lower lobe present, No overt respiratory distress CVS: Normal heart sounds. Murmurs - no Ext: Stigmata of Connective Tissue Disease - no HEENT: Normal upper airway. PEERL +. No post nasal drip        Assessment:       ICD-10-CM   1. ILD (interstitial lung disease) (HCC)  J84.9 CBC with Differential/Platelet    IgE    Resp Allergy Profile Regn2DC DE MD [facility]    Sedimentation rate    ANCA screen with reflex titer    Mpo/pr-3 (anca) antibodies    Mpo/pr-3 (anca) antibodies    ANCA screen with reflex titer    Sedimentation rate     Resp Allergy Profile Regn2DC DE MD [facility]    IgE    CBC with Differential/Platelet    2. History of asthma  Z87.09 Nitric oxide    CBC with Differential/Platelet    IgE    Resp Allergy Profile Regn2DC DE MD [facility]    Sedimentation rate    ANCA screen with reflex titer    Mpo/pr-3 (anca) antibodies    doxycycline (VIBRA-TABS) 100 MG tablet    predniSONE (DELTASONE) 10 MG tablet    Mpo/pr-3 (anca) antibodies    ANCA screen with reflex titer    Sedimentation rate    Resp Allergy Profile Regn2DC DE MD [facility]    IgE    CBC with Differential/Platelet    3. Interstitial pulmonary disease (HCC)  J84.9 CT Chest High Resolution    4. Chronic sinusitis, unspecified location  J32.9 CT Maxillofacial LTD WO CM         Plan:     Patient Instructions  Chronic respiratory failure with hypoxia (HCC) Interstitial lung disease due to connective tissue disease ([place]) High risk medication use Therapeutic drug monitoring REsearch subject - Pulse iNO study - randomized 10/13/20 and rolled over open label 03/29/21  -Cointued Progressive interstitial lung disease.  Currently on maximal therapy. Now needing 5L [place] rest - Plan ---Continue using oxygen 4 L at rest and 5- 8 L with exertion  -Monitor pulse ox with exertion and keep it over 86% -Continue participation in ILD-pro research  registry -Continue pirfenidone =Continue iNO pulsed device per study protocol - get HRCT chest (last sept 2021)  Obstructive sleep apnea  Plan -Continue CPAP  Chrnonic persistnet sinusitis - x 6 weeks    - too bad is recurring every fall/winter and now ongoing x 6 weeks despite antibiotic and predisone course  Plan - continue regular flonase and netti pot and atrovent nasal spray - Do CT sinus without contrast  - Do CBC with diff, IgE and RAST profile , sed rate, ANCA, MPO, PR-3 04/27/2021 (before you start prednisone) - Do feno test - Take doxycycline 159m po twice daily x 7 days; take after meals and  avoid sunlight - Please take Take prednisone 416monce daily x 2 days, then 3055mnce daily x 2 days, then 78m60mce daily x 2 days, then prednisone 10mg44me daily  x 2 days and then baseline 5 mg per day to continue   Asthma - not in flare up but at risk due to abve - feno test elevated 139 ppb 04/27/2021  but could be due to study drug -   Plan  - continue trelegy scheduled  - use alb as needed - tests as above for sinus  Followup - telephone visit in 2-3 weeks (or video) to discus results  - research visit per protocol  - make Standard of care visit in 3 months - 30 min - we can cancel v keep it if needed    SIGNATURE    Dr. MuralBrand Males., F.C.C.P,  Pulmonary and Critical Care Medicine Staff Physician, [facility] Interstitial Lung Disease  Program  Pulmonary [id number]: [phone] answer or between  15:00h - 7:00h: call [phone] Telephone: [phone]  5:33 PM 04/27/2021

## 2021-04-28 LAB — ANCA SCREEN W REFLEX TITER: ANCA Screen: NEGATIVE

## 2021-04-28 LAB — IGE: IgE (Immunoglobulin E), Serum: 7 kU/L (ref <=114)

## 2021-04-28 LAB — MPO/PR-3 (ANCA) ANTIBODIES
Myeloperoxidase Abs: <1 AI
Serine Protease 3: <1 AI

## 2021-04-29 LAB — RESPIRATORY ALLERGY PROFILE REGION II ~~LOC~~

## 2021-04-29 LAB — INTERPRETATION:

## 2021-05-04 ENCOUNTER — Telehealth: Payer: Self-pay | Admitting: Internal Medicine

## 2021-05-04 NOTE — Telephone Encounter (Signed)
Televisit to go over results 05/24/21

## 2021-05-13 ENCOUNTER — Ambulatory Visit: Payer: Medicare Other | Admitting: Internal Medicine

## 2021-05-14 ENCOUNTER — Other Ambulatory Visit: Payer: Self-pay

## 2021-05-14 ENCOUNTER — Ambulatory Visit
Admission: RE | Admit: 2021-05-14 | Discharge: 2021-05-14 | Disposition: A | Discharge status: Home / Self Care | Payer: Medicare Other | Source: Ambulatory Visit | Attending: Internal Medicine | Admitting: Internal Medicine

## 2021-05-14 DIAGNOSIS — J329 Chronic sinusitis, unspecified: Secondary | ICD-10-CM

## 2021-05-14 DIAGNOSIS — J849 Interstitial pulmonary disease, unspecified: Secondary | ICD-10-CM

## 2021-05-24 ENCOUNTER — Ambulatory Visit: Payer: Medicare Other

## 2021-05-24 ENCOUNTER — Other Ambulatory Visit: Payer: Self-pay | Admitting: Internal Medicine

## 2021-05-24 ENCOUNTER — Telehealth: Payer: Medicare Other | Admitting: Internal Medicine

## 2021-05-24 ENCOUNTER — Other Ambulatory Visit: Payer: Self-pay

## 2021-05-24 ENCOUNTER — Encounter: Payer: Medicare Other | Admitting: Internal Medicine

## 2021-05-24 ENCOUNTER — Telehealth (INDEPENDENT_AMBULATORY_CARE_PROVIDER_SITE_OTHER): Payer: Medicare Other | Admitting: Internal Medicine

## 2021-05-24 ENCOUNTER — Telehealth: Payer: Self-pay | Admitting: Internal Medicine

## 2021-05-24 ENCOUNTER — Encounter: Payer: Medicare Other | Admitting: *Deleted

## 2021-05-24 DIAGNOSIS — J849 Interstitial pulmonary disease, unspecified: Secondary | ICD-10-CM

## 2021-05-24 DIAGNOSIS — J9611 Chronic respiratory failure with hypoxia: Secondary | ICD-10-CM

## 2021-05-24 DIAGNOSIS — I2510 Atherosclerotic heart disease of native coronary artery without angina pectoris: Secondary | ICD-10-CM

## 2021-05-24 DIAGNOSIS — Z006 Encounter for examination for normal comparison and control in clinical research program: Secondary | ICD-10-CM

## 2021-05-24 LAB — PULMONARY FUNCTION TEST
DL/VA % pred: 76 %
DL/VA: 3.21 ml/min/mmHg/L
DLCO unc % pred: 47 %
DLCO unc: 8.17 ml/min/mmHg
FEF 25-75 Pre: 0.7 L/sec
FEF2575-%Pred-Pre: 50 %
FEV1-%Pred-Pre: 60 %
FEV1-Pre: 1.06 L
FEV1FVC-%Pred-Pre: 99 %
FEV6-%Pred-Pre: 63 %
FEV6-Pre: 1.42 L
FEV6FVC-%Pred-Pre: 104 %
FVC-%Pred-Pre: 60 %
FVC-Pre: 1.44 L
Pre FEV1/FVC ratio: 74 %
Pre FEV6/FVC Ratio: 99 %

## 2021-05-24 NOTE — Patient Instructions (Addendum)
Chronic respiratory failure with hypoxia (HCC) Interstitial lung disease due to connective tissue disease (HCC) High risk medication use Therapeutic drug monitoring REsearch subject - Pulse iNO study - randomized 10/13/20 and rolled over open label 03/29/21  -Cointued Progressive interstitial lung disease.  Currently on maximal therapy. Now needing 6L [place] rest for 39md - Plan ---Continue using oxygen 4 L at rest and 5- 8 L with exertion  -Monitor pulse ox with exertion and keep it over 86% -Continue participation in ILD-pro research registry -Continue pirfenidone but at next visit can evaluate for nintedanib =Continue iNO pulsed device per study protocol for now -Discussed with Dr. 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 RChase Caller

## 2021-05-24 NOTE — Progress Notes (Signed)
Disclaimer: This blurb is a brief overview of the study this patient is participating in. It is for the use of providers caring for the patient. It is not a regulatory declaration, and may or may not reflect all elements of the protocol.  Title:  A Randomized, double-blind, placebo-controlled dose escalation and verification clinical study, to assess the safety and efficacy of pulsed, inhaled nitric oxide (iNO) in subjects at risk of pulmonary hypertension associated with pulmonary fibrosis on long term oxygen therapy (Part 1 and Part 2). Primary Endpoint: The placebo-corrected change for INOpulse in minutes of moderate to vigorous physical activity (MVPA) measured by actigraphy from baseline to month 4. )  Duration of treatment: Study participants will receive iNO 45 mcg/kg IBW/hr versus placebo for 4 months (16 weeks) during Part 1. Then in Part 2: Open Label Extension (OLE) Study participants will be offered open label therapy at iNO 45 mcg/kg IBW/hr after completing the Part 1.  Protocol #: PULSE-PHPF-001 (REBUILD), ClinicalTrials.gov Identifier: [id number], **Sponsor is [employer], [place], [place] [address])   Xxxxx  Open label extension visit 4.  At this visit she tells me that she continues to have progressive worsening of shortness of breath.  She is unable to tell if she is any worse compared to the last visit.  She continues to have some exertional wheezing.  Otherwise she is compliant with study.  It appears that she does not have any acute exacerbation of symptoms or new symptoms since last visit.  She continues to be interested in participating the research study   Physical exam done and is in the shadow research chart   A Research subject Progressive phenotype interstitial lung disease  Plan  -Standard of care visit later today to discuss CT scan results -Otherwise research schedule of events per protocol     SIGNATURE    Dr. Brand Males, M.D.,  F.C.C.P,  Pulmonary and Critical Care Medicine Staff Physician, [place] Director - Interstitial Lung Disease  Program  [facility] [place], [place], [address]  NPI Number:  NPI #[id number]  Pager: [phone], If no answer  -> Check AMION or Try [phone] Telephone (clinical office): [phone] Telephone (research): [phone]  5:17 PM 05/24/2021

## 2021-05-24 NOTE — Telephone Encounter (Signed)
Please cancel today's standard of care visit to discuss her CT results (shows progressive ILD compared to a year ago - you can tell her that) but say that I will group with research coordinator her reearch data etc and can discuss better - later in week  Can you give her a telephone or video visit or face to face later this week or next week. IF video/face to face is 30 min

## 2021-05-24 NOTE — Patient Instructions (Signed)
ICD-10-CM   1. Research subject  Z00.6     2. Interstitial pulmonary disease ([employer])  J84.9       Per protocoo

## 2021-05-24 NOTE — Progress Notes (Signed)
05/21/2018- 75 year old female never smoker followed for allergic rhinitis, rhinosinusitis, asthmatic bronchitis, ILD, OSA,complicated by GERD/Crohn's, Rheumatoid arthritis/prednisone -----4 month follow up for asthma and OSA. Per patient she is still having issues with SOB. She has had 2 foot surgeries since her last visit. States that the [name] is working but [name] wanted to switch her to something else. Unsure of the medication name and I did not see it in his last OV.  Singulair, and Anoro, neb duoneb CPAP auto 5-15/Advanced   Download 100% compliance AHI 1.3/hour. I discussed FENO and she will try Symbicort. Discussed inhaled steroids.  We need to get her a new AeroChamber. Has been on sustained antibiotics for failed appliance in her foot. Notices some runny nose and scant bland mucus.  Wheezing has been well controlled.  Little cough.  She and her husband recognize inability to get any exercise as a substantial reason for dyspnea.  No acute events.  Cardiology continues to follow, including her pulmonary hypertension.  We reviewed her most recent chest CT and discussed implications of progressive interstitial fibrosis.  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  I would like to refer her to [name] for his opinion and advice. FENO 03/15/18  49 H CT chest Hi Res 02/02/2018- IMPRESSION: 1. Spectrum of findings compatible with fibrotic interstitial lung disease with a slight basilar predominance, with mild progression since 2017. 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 2. Mild patchy air trapping, unchanged, indicative of small airways disease. 3. Left main and two-vessel coronary atherosclerosis. 4. Stable dilated main pulmonary artery, suggesting chronic pulmonary arterial hypertension. Aortic Atherosclerosis (ICD10-I70.0).  06/08/18-  75 year old female never smoker followed for allergic rhinitis, rhinosinusitis, asthmatic bronchitis, ILD, OSA,complicated by GERD/Crohn's, Rheumatoid arthritis/prednisone -----coughing for 2 weeks, yellow, green mucus, SOB  Singulair, Atrovent nasal spray, Symbicort 160, and Anoro, neb albuterol, neb DuoNeb, Complains of increased cough with chest congestion, green/yellow sputum, low-grade fever 99 degrees over the last 2 weeks.  Not on prednisone now.  Last antibiotic in October. Pending ILD consultation with [name] in December.   07/02/2018  - Visit   75 year old female patient presenting today for acute visit.  Patient was recently treated with a course of Augmentin and reports sputum color is now light green but has decreased in sputum amount since last office visit.  Patient does not feel she is getting better.  Patient remains adherent to Anoro Ellipta.  On arrival to our office today patient was short of breath and oxygen saturations were 81%.  With deep breathing patient was able to increase oxygenation to 85%.  When placed on 2 L O2 patient was able to maintain at 91%.  Patient had previously been on oxygen in 2015 but this was later [facility].  Patient's weight has also increased over the previous office visits, patient remains adherent to diuretic treatment plan as on taking 1 diuretic daily.  Patient reports she is allowed to take an additional diuretic when she feels necessary.  Unfortunately patient has not been weighing herself regularly there is no set plan to when she should take her second diuretic.  Patient is exceptionally sedentary at home and rarely gets out of her bed or lazy boy chair which is about 3 feet from her bed.  Patient's husband takes care of patient completely including getting drinks and food so patient does not have to leave her lazy  boy recliner.  Patient watches many television shows and does not report much activity.   Patient has completed follow-up with  rheumatology last week and no changes to her medications were made.  Patient reports that she was stable at the time.  CPAP compliance report showing 30 out of 30 days used.  All 30 those days greater than 4 hours.  APAP settings 5-15.  Average usage days 7 hours and 56 minutes.  AHI 0.8.    OV 07/31/2018 -referred to interstitial lung disease clinic  Subjective:  [name], female , DOB: Nov 18, 1945 , age 59 y.o. , MRN: [id number] , ADDRESS: [address] [place] [address]   07/31/2018 -   Chief Complaint  Patient presents with   Follow-up    [name] that stated to be referred to ILD clinic.  Pt currently has complaints of wheezing which she has had since December 2019, worsening SOB when moving around and also states she has a pain in left side of chest near shoulder. Pt wears between 2-4L O2 but mainly has been wearing 4L at home.      HPI [name] 75 y.o. -accompanied by her husband.  Referred by [name] pulmonologist in our practice for evaluation of interstitial lung disease in the setting of rheumatoid arthritis.  History is gained from her, talking to her husband, review of recent office visits and also the interstitial lung disease questionnaire.  As best as I can gather   [place] ILD Questionnaire  Symptoms: She is known to have ILD on a CT chest in 2017 but she is not aware of it.  She says that she follows normally for sleep apnea and asthma with [name].  She recollects that approximately 11 or 12 years ago she was hospitalized for pneumonia and was on oxygen for short while not otherwise specified.  Then came off oxygen.  After that overall she is been stable and sedentary using her CPAP.  She is mostly sedentary because of obesity, rheumatoid arthritis and also because of the nonhealing ulcer in her feet for which she is been advised sedentary lifestyle to enable healing.  Most recently to  Thanksgiving 2019 she was seen for respiratory exacerbation and given antibiotics and prednisone.  She followed up mid December 2019 and was found to have new onset hypoxemia 85% on room air.  She was then discharged on portable oxygen which she is using 4 L nasal cannula at rest.  She tells me that at home when she comes off of CPAP she is in her 85s on room air at rest.  In fact in the office today she is 83% on room air at rest but 93% on 2 L nasal cannula at rest.  There is concerned that she has worsening ILD particularly summer 2019 on her CT chest had progressive findings compared to 2 years earlier.  The presumption is that this ILD is caused by rheumatoid arthritis.  In the 2019 CT chest CT is reported as probable UIP but in my personal visualization is either indeterminate or alternative diagnosis.  In terms of symptoms: She reports insidious onset of shortness of breath for some years and gradually getting worse.  Level 2 dyspnea at rest.  Level for dyspnea taking a shower.  Level 5 dyspnea walking at her own pace of walking with others of her age and walking up stairs or walking up a hill.  She does  have a cough with yellow and green sputum.  She does cough at night.  There is also wheezing  Past Medical History : She has longstanding history of rheumatoid arthritis since the 1980s.  Used to be followed by Dr. Danie Binder.  As best as I can gather she used to be on methotrexate maybe 10 or 20 years ago.  She took it for a short while and then stopped it because of diarrhea.  She took this for less than 1 year.  She believes that after that she was basically on pain management.  She is only been on prednisone chronic intake for a total of 1 year approximately 4 to 5 years ago.  Around this time Dr. Hoyt Koch retired and she started seeing Dr. Gavin Pound.  Since then she is been on Arava and Plaquenil.  She has never seen any other immunomodulators in the setting of rheumatoid arthritis  according to history.  Overall the rheumatoid arthritis has caused some disability with deformed joints in her hands and also her using cane.    She is not on any anticoagulation is never had any blood clots.  In 2017 she had vascular extremity duplex venous that was negative for DVT.  Most recently she is been dealing with nonhealing ulcer of the left toe/metatarsal .  She had August and September 2018.  And then 1 in the latter part of 2019.  Most recently she says she has had a duplex lower extremity venous.  I do not have access to these results.  She is waiting on this.  Was done in the last few to several days.   She also has a longstanding history of asthma for which she is on Singulair.  Exam nitric oxide was elevated in August 2019 at 49 ppb but 07/31/2018 I snormal in the 20s though she is wheezing   She has nonobstructive coronary artery disease and in March 2018 had a right heart catheterization with elevated pulmonary pressures but also slightly elevated wedge pressure [see below].  She is started on Lasix  She has sleep apnea for which she uses CPAP.  She has obesity  ROS: Positive for arthralgia and arthritis.  Dry eyes and dry mouth but otherwise negative   FAMILY HISTORY of LUNG DISEASE: Denies   EXPOSURE HISTORY: Never smoked any cigarettes, marijuana, vaping, cocaine or intravenous drug use   HOME and HOBBY DETAILS : [place] home [place] lived there for 8 years.  No mold.  No dampness.  Does use humidifier and does use CPAP but there is no mold in it.  Also uses nebulizer machine but there is no mold.  No pet birds no musical instruments no guarding habits   OCCUPATIONAL HISTORY (122 questions) : Denies   PULMONARY TOXICITY HISTORY (27 items): She has taken nitrofurantoin 9 years ago.  Has not taken methotrexate for a year 5 years ago and is taken sulfasalazine       Results for JASMINEMARIE, SHERRARD (MRN [id number]) as of 07/31/2018 16:31  Ref. Range  09/03/2015 12:45 02/21/2017  FVC-Pre Latest Units: L 2.18 1.7L  FVC-%Pred-Pre Latest Units: % 84 66%   Results for MYESHA, STILLION (MRN [id number]) as of 07/31/2018 16:31  Ref. Range 09/03/2015 12:45  DLCO cor Latest Units: ml/min/mmHg 14.06  DLCO cor % pred Latest Units: % 69   HEART CATH MARCH 2018 Right Heart Pressures RA (mean): 13 mmHg RV (S/EDP): 38/12 mmHg PA (S/D, mean): 43/23 (32) mmHg PCWP (mean): 16  mmHg    Conclusions: Mild to moderate, non-obstructive coronary artery disease with 50% mid LAD and 20% proximal RCA stenoses. Mild to moderate pulmonary hypertension with mildly elevated right heart filling pressures. Upper normal left heart filling pressures. Normal Fick cardiac output/index. Equalization of end-diastolic pressures with ventricular concordance. This can be seen in the setting of restrictive process.   Recommendations: Escalate statin therapy to prevent progression of CAD. Increase furosemide to 40 mg BID and KCl to 40 mg BID. Patient to have BMP in 1 week to evaluate renal function and potassium. Continue outpatient pulmonary follow-up. I suspect underlying lung disease is the driving force behind the patient's dyspnea, pulmonary hypertension, and elevated right heart pressures. Outpatient follow-up in cardiology clinic in ~6 weeks.   Nelva Bush, MD   CT chest high res July 2019 - visualized personally - personaly opinion - indeterminate or alt dx for UIP but agree with progression  IMPRESSION: Lungs/Pleura: No pneumothorax. No pleural effusion. No acute consolidative airspace disease or lung masses. Few scattered small solid pulmonary nodules in mid to upper right lung, largest 4 mm in the right upper lobe (series 3/image 46), unchanged since 03/14/2016 CT, considered benign. No new significant pulmonary nodules. Mild patchy air trapping in both lungs on the expiration sequence, unchanged. Patchy peribronchovascular and subpleural reticulation and  ground-glass attenuation throughout both lungs with associated mild traction bronchiectasis and mild architectural distortion. There is a slight basilar gradient to these findings. Tiny focus of honeycombing in anterior right upper lobe (series 8/image 59). These findings have mildly worsened since 03/14/2016 chest CT. 1. Spectrum of findings compatible with fibrotic interstitial lung disease with a slight basilar predominance, with mild progression since 2017. 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 2. Mild patchy air trapping, unchanged, indicative of small airways disease. 3. Left main and two-vessel coronary atherosclerosis. 4. Stable dilated main pulmonary artery, suggesting chronic pulmonary arterial hypertension.   Aortic Atherosclerosis (ICD10-I70.0).     Electronically Signed   By: Ilona Sorrel M.D.   On: 02/02/2018 14:03        OV 03/15/2019  Subjective:  [name], female , DOB: [birth year] , age 44 y.o. , MRN: [id number] , AD[address]ESS: [address] [place] [address]   03/15/2019 -   Chief Complaint  Patient presents with   IPD (Interstitial pulmonary disease)    Breathing is doing better since being on Esbriet. Has productive cough with green phelgm. Has been on antibiotic for last 6 weeks. Had hip replacement in June.     HPI [name] 75 y.o. 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  Started on Esbriet in March 2020.  Has progressive phenotype.   She presents for routine follow-up with her husband.  She tells me that ever since starting Esbriet in the spring 2020 she has been doing well.  She does not feel that the disease has progressed.  In fact she feels somewhat better.  She continues to use 4 L of oxygen.  She is now taking the Esbriet at 1 pill 3 times daily.  This is the  full dose of 1 pill.  Her last pulmonary function test was in early 2020.  It has been a while since she did her liver function test and she requires a repeat of that today.  She is willing to have her flu shot today.  She is  asking for prescription for emergency electric generator.  She does not know about the [employer] energy assistance program.  She has a handicap sticker.  She is interested in the ILD-PRO research registry study.    She is on Trelegy inhaler and Singulair I am not fully clear why   In June 2020 she had hip replacement for stress fracture and was on 6 weeks of Keflex.  She is slowly improving.  She uses a walker.  OV 05/17/2019  Subjective:  [name], female , DOB: 11-24-1945 , age 54 y.o. , MRN: [id number] , AD[address]ESS: [address] [place] [address]   05/17/2019 -   Chief Complaint  Patient presents with   Follow-up    Patient reports that she has had a productive cough with yellow sputum and her PCP placed her on an antibiotic.    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  Started on Esbriet in March 2020.  Has progressive phenotype. COVID negative 04/25/2019,. On 02 4L [place] since end 2019  Also hx of asthma  Also hx of chronic sinusitis - 2017 CT maxillary sinusits - DR Wilburn Cornelia  History of sleep apnea on CPAP  HPI [name] 75 y.o. -presents to the ILD clinic for the above issues.  She continues on pirfenidone since March 2020.  She says this is helping her.  No adverse effects.  Last liver function test was August 2020.  She needs another liver function test right now.  No nausea vomiting or diarrhea or weight loss.  She continues on 4 L of oxygen continuously 24/7.  At night she uses CPAP for her sleep apnea.  She and her husband think that she needs 6 L of oxygen at night and they are trying to increase the oxygen.  Although this is not based on data but on subjective reasoning.     New issue associated with sinus issues: Her main issue today is that for the last 3-week she has had worsening of her cough and there is associated yellow sputum.  Her COVID-19 test was -2 days ago.  This been going on for 3 weeks.  She tells me this happens to her every winter.  She is complaining of chronic postnasal drainage.  In 2017 she had a CT scan of the sinus that showed maxillary sinusitis and saw Dr. Wilburn Cornelia but since then has not followed up.  She does not have a history of sinus surgery.  She is frustrated by the cough and the yellow sputum.  Her pulmonary function test itself is stable since she started the [place].  She is interested in the ILD pro registry study.  In terms of her asthma: She is on Trelegy and Singulair.  She does not want to stop this.  There is a clinical history.  She says that if she stops this it gets worse.   In terms of her sleep apnea: She continues on CPAP with oxygen at night.    ROS    IMPRESSION: CT chest Jan  2020 1. Severe tracheobronchomalacia, more pronounced on today's scan. 2. Interval progression of basilar predominant fibrotic interstitial lung disease with particularly increased ground-glass component. No honeycombing. 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 Findings are consistent with UIP per consensus guidelines: Diagnosis of Idiopathic Pulmonary Fibrosis: An Official ATS/ERS/JRS/ALAT Clinical Practice Guideline. [place], Iss 5, [id number],  Mar 18 2017. 3. Stable dilated main pulmonary artery, suggesting chronic pulmonary arterial hypertension. 4. Left main and 3 vessel coronary atherosclerosis.   Aortic Atherosclerosis (ICD10-I70.0).     Electronically Signed   By: Ilona Sorrel M.D.   On: 08/20/2018 08:21   - per HPI  OV 04/02/2020  Subjective:  [name], female , DOB: 1946/03/01 , age 60 y.o. 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  Started on Esbriet in March 2020.  Has progressive phenotype. COVID negative 04/25/2019,. On 02 4L [place] since end 2019  - on ild pro registry  Also hx of asthma -Singulair and inhaler  Also hx of chronic sinusitis - 2017 CT maxillary sinusits - DR Wilburn Cornelia  History of sleep apnea on CPAP  Rheumatoid arthritis seen by Dr. Gavin Pound on leflunomide, Plaquenil and daily prednisone 5 mg/day.  04/02/2020 -   Chief Complaint  Patient presents with   Follow-up    pt is here to go over pft results     HPI SHANIYAH WIX 75 y.o. -presents for the above issues of interstitial lung disease in the setting of connective tissue disease.  She is on leflunomide and Plaquenil and prednisone with her rheumatologist.  She is on pirfenidone for her ILD.  I personally last saw her almost a year ago in October 2020.  After that she is seen nurse practitioner.  In the interim she says she is overall stable with the symptom score show a different story she has had significant worsening of her symptoms.  She continues to be morbidly obese.  Walking desaturation test she is desaturating to the point she is requiring 6 L to correct.  In fact early in August 2020 [place] and was extremely short of breath.  Even at rest on 3 L she was 89%.  She continues to use a CPAP.  Her husband who is here with her today would like her to lose weight in order to help her health status.  Pulmonary function test shows also a decline.  Her last CT scan of the chest was in June 2020    [place] 05/13/2020   Subjective:  [name], female , DOB: Feb 07, 1946, age 86 y.o. years. 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    - Started on Esbriet in March 2020.  Has progressive phenotype. COVID negative 04/25/2019,. On 02 4L [place] since end 2019  - on ild pro registry  Also hx of asthma -Singulair and inhaler  Also hx of chronic sinusitis - 2017 CT maxillary sinusits - DR Wilburn Cornelia  History of sleep apnea on CPAP  Rheumatoid arthritis seen by Dr. Gavin Pound on leflunomide, Plaquenil and daily prednisone 5 mg/day.  Elevated pulmonary artery pressures but PVR less than 3 in 2018: Right Heart Pressures RA (mean): 13 mmHg RV (S/EDP): 38/12 mmHg PA (S/D, mean): 43/23 (32) mmHg PCWP (mean): 16 mmHg  Ao sat: 94% PA sat: 70% RA sat: 75%  Fick  CO: 6.4 L/min Fick CI: 3.7 L/min/m^2  PVR: 2.5 [place] units     Chief Complaint  Patient presents with   Follow-up    more sob walking shorter distances.  coughing up yellow phlegm.       HPI [name] 75 y.o. -last visit September 2021.  At that time concern for progressive ILD.  Therefore we decided to PFTs and also high-resolution CT chest.  The high-resolution CT chest compared to last year shows stability.  The PFTs show stability within this year but progression compared to last year.  She is also lost weight following a low carbohydrate diet.  Nevertheless she feels the weight of her symptoms.  She is also using oxygen with exertion.  She feels this is getting worse.  Although her overall symptom burden seems to be improved with the weight loss.  Early morning she says there are some yellow phlegm which is stable for many many years.  This is mostly sinus.  There is no change in this.  She continues on immunosuppressants and antifibrotic.  There is no  side effects from these.  She is willing to have labs checked.  She has had a Covid vaccine including booster.  She does not know if she has antibody response yet.  She is willing to get this checked.  She is looking at options to get better.  She will participate in the ILD-pro registry visit today.  Review of the records indicate the last right eye catheterization was in 2018.  Pressures were borderline at that time.  She is willing to get this checked.  Her cardiologist is in [place].  She wants to get the procedure done in [place] and is willing to see Dr. Haroldine Laws or Dr. Aundra Dubin who are well versed with right heart catheterization locally.    Wt Readings from Last 3 Encounters:  05/13/20 202 lb 12.8 oz (92 kg)  04/02/20 207 lb 9.6 oz (94.2 kg)  02/05/20 218 lb (98.9 kg)       TECHNIQUE: CT chest sept 2021 Multidetector CT imaging of the chest was performed following the standard protocol without intravenous contrast. High resolution imaging of the lungs, as well as inspiratory and expiratory imaging, was performed.   COMPARISON:  08/16/2018, 02/02/2018, 03/14/2016, 08/27/2015   FINDINGS: Cardiovascular: Aortic atherosclerosis. Normal heart size. Three-vessel coronary artery calcifications. No pericardial effusion.   Mediastinum/Nodes: No enlarged mediastinal, hilar, or axillary lymph nodes. Thyroid gland, trachea, and esophagus demonstrate no significant findings.   Lungs/Pleura: Unchanged moderate pulmonary fibrosis in a pattern with apical to basal gradient featuring irregular peripheral interstitial opacity, septal thickening, ground-glass, and minimal tubular bronchiectasis without evidence of bronchiolectasis or honeycombing. Fibrotic findings are not significantly changed compared to immediate prior examination but significantly worsened over time on examinations dating back to 08/27/2015 occasional stable, definitively benign small pulmonary nodules, for  example a 3 mm nodule of the anterior right upper lobe (series 10, image 77). No significant air trapping on expiratory phase imaging. No pleural effusion or pneumothorax.   Upper Abdomen: No acute abnormality.   Musculoskeletal: No chest wall mass or suspicious bone lesions identified.   IMPRESSION: 1. Unchanged moderate pulmonary fibrosis in a pattern with apical to basal gradient featuring irregular peripheral interstitial opacity, septal thickening, ground-glass, and minimal tubular bronchiectasis without evidence of bronchiolectasis or honeycombing. Fibrotic findings are not significantly changed compared to immediate prior examination but significantly worsened over time on examinations dating back to occasional 08/27/2015. Findings remain consistent with a "  probable UIP" pattern by pulmonary fibrosis criteria and generally in keeping with reported history of rheumatoid arthritis and associated fibrotic interstitial lung disease. 2. Coronary artery disease.  Aortic Atherosclerosis (ICD10-I70.0).     Electronically Signed   By: Eddie Candle M.D.   On: 04/15/2020 15:35     OV 08/19/2020  Subjective:  [name], female , DOB: 12/22/1945 , age 20 y.o. 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    - Started on Esbriet in March 2020.  Has progressive phenotype. COVID negative 04/25/2019,. On 02 4L [place] since end 2019  - on ild pro  registry  Also hx of asthma -Singulair and inhaler  Also hx of chronic sinusitis - 2017 CT maxillary sinusits - DR Wilburn Cornelia  History of sleep apnea on CPAP  Rheumatoid arthritis seen by Dr. Gavin Pound on leflunomide, Plaquenil and daily prednisone 5 mg/day.    HPI [name] 75 y.o. -presents with her husband.  Last seen in October 2021.  After that in January 2022 she was supposed to have a right heart catheterization because of concern of progressive ILD and her having autoimmune phenotype.  However middle of January 2022 she got sick with bronchitis.  She and her husband say that she gets this sick every year.  They do not know if it was Covid because she did not get tested.  She says primary care physician treated her with 10 days of 20 mg/day prednisone [at baseline she is on 5 mg/day of].  And also doxycycline for 10 days.  This is not helped.  Therefore she is now on amoxicillin and a second course of prednisone 20 mg/day for 10 days.  Her baseline dose is 5 mg prednisone.  She is not feeling better.  In fact her symptom scores are much worse.  Her pulse ox on room air at rest was 88%.  Previously room air at rest was fine.  She is frustrated by her symptoms.  She and her husband are frustrated by the symptom burden.  She is requesting Phenergan with codeine for cough control.     OV 09/03/2020  Subjective:  [name], female , DOB: 10/07/1945 , age 61 y.o. , MRN: [id number] , ADDRESS: [address] [place] [address] PCP Lorene Dy, MD Patient Care Team: Lorene Dy, MD as PCP - General (Internal Medicine) End, Harrell Gave, MD as PCP - Cardiology (Cardiology) Gavin Pound, MD as Consulting Physician (Rheumatology)  This Provider for this visit: Treatment Team:  Attending Provider: Brand Males, MD  Type of visit: Telephone/Video Circumstance: COVID-19 national emergency Identification of patient ITZELLE GAINS with [birth year]  and MRN [id number] - 2 person identifier Risks: Risks, benefits, limitations of telephone visit explained. Patient understood and verbalized agreement to proceed Anyone else on call: just patient Patient location: [phone] This provider location: [place]   09/03/2020 -  No chief complaint on file.   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    - Started on Esbriet in March 2020.  Has progressive phenotype. COVID negative 04/25/2019,. On 02 4L [place] since end 2019   - on ild pro registry  Also hx of asthma -Singulair and inhaler  Also hx of chronic sinusitis - 2017 CT maxillary sinusits - DR Wilburn Cornelia  History of sleep apnea on CPAP  Rheumatoid arthritis seen by Dr. Gavin Pound on leflunomide, Plaquenil and daily prednisone 5 mg/day.  Positive Covid IgG - Feb 2022   HPI [name] 75 y.o. -this telephone visit this telephone visit is to follow-up from recent exacerbation early February 2022.  She is finished her prednisone she is feeling back to baseline.  She says her sinuses are congested but she will follow-up with the ENT.  She is still waiting for a right heart catheterization.  She has follow-up later in March 2022 with me.  Oxygen use is baseline.  She had Covid IgG checked and this was normal.  She continues to use a CPAP.  She is regular with all her medications.  She took consent form for PULSE inhaled nitric oxide study and she is interested.  Recent lab review shows slightly high calcium.  She does not know that she has this problem    PFT   OV 10/15/2020  Subjective:  [name], female , DOB: 11/16/1945 , age 89 y.o. , MRN: [id number] , ADDRESS: [address] [place] [address] PCP Lorene Dy, MD Patient Care Team: Lorene Dy, MD as PCP - General (Internal Medicine) End, Harrell Gave, MD as PCP - Cardiology (Cardiology) Gavin Pound, MD as Consulting Physician  (Rheumatology)  This Provider for this visit: Treatment Team:  Attending Provider: Brand Males, MD    10/15/2020 -   Chief Complaint  Patient presents with   Follow-up    She felt she was breathing deeper before pft.      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      - Started on Esbriet in March 2020.  Has progressive phenotype. COVID negative 04/25/2019,. On 02 4L [place] since end 2019   - on ild pro registry  - last CT Sept 2021  - started PULSE iNO study march 2022 via study protocol -randomized trial  Also hx of asthma -Singulair and inhaler trelegy  Also hx of chronic sinusitis - 2017 CT maxillary sinusits - DR Wilburn Cornelia  History of sleep apnea on CPAP  Rheumatoid arthritis seen by Dr. Gavin Pound on leflunomide, Plaquenil and daily prednisone 5 mg/day.  Positive Covid IgG - Feb 2022   HPI [name] 75 y.o. -presents for standard of care visit.  She is now on inhaled nitric oxide study.  The inhaled nitric oxide is being bled through her oxygen.  She has to wear 12 hours a day.  She was randomized 2 days ago.  She tells me for the last 4 days she is having sinus complaints of yellow sinus drainage and increased cough.  She feels as acute sinusitis and she will benefit from antibiotics and prednisone.  Specifically she did not mention this at the time of randomization.  She denied this at the time of randomization but now she says it was present for 4 days.  She had pulmonary function test that shows continued decline in ILD.  Her oxygen use remains the same at 4 L and with exertion she uses 5 L.  She  is on Trelegy for asthma.  Her CT scan of the chest in September did not show any evidence of emphysema.  She uses CPAP for her sleep apnea.  Labs from research were reviewed by me.  Her most recent hemoglobin is 12.  Creatinine and liver function test was normal.  Calcium is not available.   Of note after starting  the inhaled nitric oxide she feels she can breathe more deeper.  However his symptom score shows continued decline.    OV 04/27/2021  Subjective:  [name], female , DOB: [birth year] , age 76 y.o. , MRN: [id number] , ADDRESS: [address] [place] [id number] PCP Lorene Dy, MD Patient Care Team: Lorene Dy, MD as PCP - General (Internal Medicine) End, Harrell Gave, MD as PCP - Cardiology (Cardiology) Gavin Pound, MD as Consulting Physician (Rheumatology)  This Provider for this visit: Treatment Team:  Attending Provider: Brand Males, MD    04/27/2021 -   Chief Complaint  Patient presents with   Follow-up    Pt states she has been sick for about 6 weeks. Has been on prednisone as well as abx and is still not over it. States she is coughing getting up yellow-green phlegm and also has had some wheezing. Also has had increased SOB.      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    - Started on Esbriet in March 2020.  Has progressive phenotype. COVID negative 04/25/2019,. On 02 4L [place] since end 2019   - on ild pro registry  - started PULSE iNO study march 2022 via study protocol -randomized trial -> randomized to Open Label in sept 2022  - [place] 11/09/20   -   RA = 12 RV = 52/14 PA = 55/25 (38) PCW = 19 Fick cardiac output/index = 5.8/3.1 Thermo CO/CI = 7.4/4.0 PVR = 3.3 WU (Fick) 2.6 WU (thermo) FA sat = 99% PA sat = 71%, 71% SVC sat = 76% Given relatively normal PVR may not benefit much from Tyvaso - per cads    Also hx of asthma -Singulair and inhaler trelegy  - 0% eoios last checked 2019  Also hx of chronic sinusitis  - 2017 CT maxillary sinusits - DR Wilburn Cornelia  - CT sinus nov 2020 - celar  History of sleep apnea on CPAP  Rheumatoid arthritis seen by Dr. Gavin Pound on leflunomide, Plaquenil and daily prednisone 5 mg/day.  Positive Covid IgG - Feb  2022    HPI [name] 75 y.o. -presents for standard of care visit to the ILD center with [name].  She presents with her husband.  She tells me that since summer 2022 she is having increased sinus drainage.  For the last 6 weeks she is got more cough.  She feels there is a frog in the throat.  She brings yellow drainage from the sinuses.  The drainage increases with Nettie pot saline wash.  Sometimes she is clearing this from the throat.  Her husband says this happens every winter.  2 years ago we did CT scan of the sinus and it was clear.  She has been on 2 antibiotics.  She felt cephalexin and azithromycin did not help.  She prefers to have doxycycline.  She has been on 1 prednisone 10-day taper.  She feels that she wants another course of doxycycline and prednisone.  She also states that if she takes antibiotics she will want Diflucan.  Her last sinus  CT was 2 years ago and her last CT chest was 1 year ago.  She also reports cough and chest tightness.  Worsening shortness of breath.  Despite subjective reporting of worsening shortness of breath subjective objective dyspnea score on the scoring scale looks the same  She continues on antifibrotic pirfenidone.  She is tolerating this well.  She is also on inhaled nitric oxide open label with the oxygen.  Her pulmonary function test shows continued decline.  Because of her symptoms we did a nitric oxide exhaled test and this was high.  Unclear if this because of inhaled nitric oxide.  Research of the study sponsor on this. Lab Results  Component Value Date   NITRICOXIDE 139 04/27/2021     OV 05/24/2021  Subjective:  [name], female , DOB: [birth year] , age 36 y.o. , MRN: [id number] , ADDRESS: [address] [place] [address] PCP Lorene Dy, MD Patient Care Team: Lorene Dy, MD as PCP - General (Internal Medicine) End, Harrell Gave, MD as PCP - Cardiology (Cardiology) Gavin Pound, MD as  Consulting Physician (Rheumatology)  This Provider for this visit: Treatment Team:  Attending Provider: Brand Males, MD   Type of visit: Video Circumstance: COVID-19 national emergency Identification of patient [name] with [birth year] and MRN [id number] - 2 person identifier Risks: Risks, benefits, limitations of telephone visit explained. Patient understood and verbalized agreement to proceed Anyone else on call: patient on video. 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    - Started on Esbriet in March 2020.  Has progressive phenotype. COVID negative 04/25/2019,. 9   - on ild pro registry  - started PULSE iNO study march 2022 via study protocol -randomized trial -> randomized to Open Label in sept 2022  - [place]   -   RA = 12 RV = 52/14 PA = 55/25 (38) PCW = 19 Fick cardiac output/index = 5.8/3.1 Thermo CO/CI = 7.4/4.0 PVR = 3.3 WU (Fick) 2.6 WU (thermo) FA sat = 99% PA sat = 71%, 71% SVC sat = 76% Given relatively normal PVR may not benefit much from Tyvaso - per cads    Also hx of chronic sinusitis  - 2017 CT maxillary sinusits - DR SWilburn Cornelia - CT sinus nov 2020 and October 2022- celar  - normal ANCA panel Oct 2022  History of sleep apnea on CPAP  Rheumatoid arthritis seen by Dr. AGavin Poundon leflunomide, Plaquenil and daily prednisone 5 mg/day.  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 - standard of care visit to discuss CT  and lab results  Per research team:  She rated her shortness of breath today at a 5 out of 5 today and  has been since July 18th, before it was 4 out of 5. Her Borg Dyspnea score was 3 (moderate) at rest the last two visits and before that only rated it 1 (slight) back in June. On 6wmd: he walked 153 meters today compared to 120 meters on 03/29/2021. SHe is currently on Open Labvel extensio of the inhaled NO Study-she was able to walk just since 6 L.  She is now on scheduled to see research team every 4 months.   19md  - 161m start of study - 167 m I June 2022 - 120 m sept 2022 -> open labe inhaled NO rolled over July 2022 - 1536m1/7/22 - needed 6L . -> improved  Labs  - ANCA - normal - IgE and RAST allergy panel negative - PFT shows continued decline    Overall it appears based on pulmonary function test and CT scan of the chest she is declined significantly.  She also has severe tracheobronchomalacia which has not been documented in the prior CT scan.  I did discuss the progression with her.  She indicated to me that she was worried about her progression.  She appears to be on optimal antifibrotic therapy with pirfenidone.  We reviewed her rheumatoid arthritis medication she no longer on Plaquenil.  Recent documented above.  Subsequently did discuss with Dr. AnGavin Pound We considered possibility of using Actemra which in scleroderma ILD is shown lung stabilization/modulation effect.  She will try to make a switch for this if insurance would approve.  Did indicate to patient that if she continued to decline then we would involve palliative care at home.  At this point in time we resolved that she will continue with the research protocol. The tracheobronchomalacia is the likely reason for exertional wheezing.    SYMPTOM  SCALE - ILD 03/15/2019  05/17/2019  04/02/2020  05/13/2020   08/19/2020 covid 10/15/2020  04/27/2021 210#  O2 use x      5L [place] pule  Shortness of Breath 0 -> 5 scale with 5 being worst (score 6 If unable to do)        At rest 0 0 4 0 _0 Simple tasks - showers, clothes change, eating, shaving 0 0 _1 Household (dishes, doing bed, laundry) _2 Shopping _3 Walking level at own pace _4 Walking up Stairs _5 Total (40 - 48) Dyspnea Score _6 How bad is your cough? _7 0 _8 How bad is your fatigue _9 nauea   0 0 0 0 0  vomit   0 0 0 0 0  diarrhea   _10 anxiety   _11 depression   _12 Simple office walk 185 feet x  3 laps goal with forehead probe 03/15/2019 - uses 4L [place] at home  04/02/2020  05/13/2020 92% on RA, 4L [place] when walking in 91% 08/19/2020 Uses 4L [place] at rest. Was 88% o RA at rest  O2 used Room air Walked on 3L     Number laps completed 1/2 lap ad desat to 88%     Comments about pace  Resting Pulse Ox/HR  97% and 75    Final Pulse Ox/HR  88% and 108 at 2nd laps    Desaturated </= 88%      Desaturated <= 3% points      Got Tachycardic >/= 90/min      Symptoms at end of test      Miscellaneous comments -walked with 4L [place] - uses 4L [place] at home Needed 6L [place] to correct     Results for FRANCYS, BOLIN (MRN [id number]) as of 04/27/2021 11:38  Ref. Range 09/03/2015 12:45 10/01/2018 14:45 05/17/2019 08:59 10/02/2019 10:50 04/01/2020 11:24 09/11/2020 14:06 covid 10/15/2020 09:59 12/08/2020 09:03 02/02/2021 08:12 researc 03/29/2021 14:07  research 05/24/21 research  FVC-Pre Latest Units: L 2.18 1.81 1.82 1.62 1.60 1.58 1.47 1.45 2.00 1.54 1.44L  FVC-%Pred-Pre Latest Units: % 84 72 74 66 65 65 63 63 82 63 60%  Results for AMYLYNN, FANO (MRN [id number]) as of 04/27/2021 11:38  Ref. Range 09/03/2015 12:45 10/01/2018 14:45 05/17/2019 08:59 10/02/2019 10:50 04/01/2020 11:24 09/11/2020  14:06 10/15/2020 09:59 12/08/2020 09:03 02/02/2021 08:12 research 03/29/2021 14:07  research 05/24/76 research  DLCO unc Latest Units: ml/min/mmHg 14.82 11.94 11.28 10.42 11.37 7.59 9.05 9.05 9.22 6.95 8.17  DLCO unc % pred Latest Units: % 73 69 65 60 66 44 54 54 53 40 45%    HRCT Oct 2022   Narrative & Impression  CLINICAL DATA:  74 year old female with history of interstitial lung disease. Worsening coughing.   EXAM: CT CHEST WITHOUT CONTRAST   TECHNIQUE: Multidetector CT imaging of the chest was performed following the standard protocol without intravenous contrast. High resolution imaging of the lungs, as well as inspiratory and expiratory imaging, was performed.   COMPARISON:  Chest CT 04/15/2020.   FINDINGS: Cardiovascular: Heart size is normal. There is no significant pericardial fluid, thickening or pericardial calcification. There is aortic atherosclerosis, as well as atherosclerosis of the great vessels of the mediastinum and the coronary arteries, including calcified atherosclerotic plaque in the left main, left anterior descending, left circumflex and right coronary arteries. Calcifications of the mitral annulus.   Mediastinum/Nodes: No pathologically enlarged mediastinal or hilar lymph nodes. Esophagus is unremarkable in appearance. No axillary lymphadenopathy.   Lungs/Pleura: High-resolution images demonstrate widespread but patchy areas of ground-glass attenuation, septal thickening, scattered subpleural reticulation, thickening of the peribronchovascular interstitium, some scattered regions of mild cylindrical bronchiectasis and peripheral bronchiolectasis, but no frank honeycombing. Findings have no discernible craniocaudal gradient. Findings are progressive compared to the prior examination. Inspiratory and expiratory imaging demonstrates extensive air trapping indicative of small airways disease. As well, there is severe collapse of the trachea and  mainstem bronchi during expiration indicative of tracheobronchomalacia. No acute consolidative airspace disease. No pleural effusions. 4 mm right middle lobe pulmonary nodule (axial image 70 of series 5), previously only 2 mm. 4 mm subpleural nodule in the periphery of the right upper lobe (axial image 46 of series 5), stable compared to the prior examination. No other larger more suspicious appearing pulmonary nodules or masses are noted.   Upper Abdomen: Subcentimeter low-attenuation lesion in segment 7 of the liver, incompletely characterized on today's non-contrast CT examination, but statistically likely to represent a small cyst.   Musculoskeletal: There are no aggressive appearing lytic or blastic lesions noted in the visualized portions of the skeleton.   IMPRESSION: 1. The appearance of the lungs is compatible with progressive interstitial lung disease, with a spectrum of findings considered most compatible with an alternative diagnosis (not usual  interstitial pneumonia) per current ATS guidelines. Specifically, findings are strongly favored to represent chronic hypersensitivity pneumonitis. 2. Slight interval enlargement of a 4 mm right middle lobe pulmonary nodule. This is nonspecific, but attention on repeat high-resolution chest CT is recommended in 12 months to ensure stability or regression of this finding. 3. Severe tracheobronchomalacia. 4. Aortic atherosclerosis, in addition to left main and 3 vessel coronary artery disease. Assessment for potential risk factor modification, dietary therapy or pharmacologic therapy may be warranted, if clinically indicated. 5. There are calcifications of the mitral annulus. Echocardiographic correlation for evaluation of potential valvular dysfunction may be warranted if clinically indicated.   Aortic Atherosclerosis (ICD10-I70.0).     Electronically Signed   By: Vinnie Langton M.D.   On: 05/16/2021 07:58      has a  past medical history of Acute asthmatic bronchitis, Allergic rhinitis, Anxiety, Arthritis, Esophageal reflux, Hypertension, Interstitial lung disease ([place]) (dx jan 2020), Irritable bowel syndrome, Oxygen dependent, PONV (postoperative nausea and vomiting), Rheumatoid arthritis(714.0), and Sleep apnea.   reports that she has never smoked. She has never used smokeless tobacco.  Past Surgical History:  Procedure Laterality Date    c secttion  1977   2 foot fusions Left    total of 6 left foot sx   ABDOMINAL HYSTERECTOMY     complete    ANKLE FUSION  2009   left   BACK SURGERY     lower l to l 5 fused   CHOLECYSTECTOMY     RIGHT HEART CATH N/A 11/09/2020   Procedure: RIGHT HEART CATH;  Surgeon: Jolaine Artist, MD;  Location: [facility];  Service: Cardiovascular;  Laterality: N/A;   RIGHT/LEFT HEART CATH AND CORONARY ANGIOGRAPHY N/A 09/16/2016   Procedure: Right/Left Heart Cath and Coronary Angiography;  Surgeon: Nelva Bush, MD;  Location: [facility];  Service: Cardiovascular;  Laterality: N/A;   TONSILLECTOMY     TOTAL HIP ARTHROPLASTY Right 12/20/2018   Procedure: TOTAL HIP ARTHROPLASTY ANTERIOR APPROACH;  Surgeon: Paralee Cancel, MD;  Location: [facility];  Service: Orthopedics;  Laterality: Right;  70 mins   TOTAL KNEE ARTHROPLASTY Bilateral     Allergies  Allergen Reactions   Cefdinir Diarrhea   Erythromycin Base Diarrhea   Lactose Dermatitis   Lactulose Diarrhea   Mesalamine Nausea Only   Methotrexate Other (See Comments)    Felt sick   Nitrofuran Derivatives     shakiness   Other Diarrhea and Other (See Comments)    Shaking uncontrollably "lettuce only" "lettuce only"   Sulfa Antibiotics Other (See Comments)    Patient can't recall reaction    Tdap [Tetanus-Diphth-Acell Pertussis]     Shaking uncontrollably   Tetanus Toxoid, Adsorbed Other (See Comments)    Shaking uncontrollable     Immunization History  Administered Date(s) Administered   Fluad  Quad(high Dose 65+) 03/15/2019   Influenza Split 03/18/2012, 03/18/2013, 04/14/2014, 04/17/2017   Influenza Whole 04/30/2008, 04/20/2009, 04/18/2011   Influenza, High Dose Seasonal PF 04/23/2015, 03/24/2016, 04/06/2018, 04/02/2020, 03/24/2021   Influenza,inj,quad, With Preservative 04/06/2018   PFIZER(Purple Top)SARS-COV-2 Vaccination 08/24/2019, 09/18/2019, 04/13/2020   Pneumococcal Conjugate-13 11/11/2013   Pneumococcal Polysaccharide-23 05/21/2018   Zoster Recombinat (Shingrix) 05/05/2017    Family History  Problem Relation Age of Onset   Heart disease Mother    Arthritis Mother    Heart attack Father    Diabetes Other        sibling   Heart attack Other  sibling     Current Outpatient Medications:    albuterol (VENTOLIN HFA) 108 (90 Base) MCG/ACT inhaler, USE 2 INHALATIONS BY MOUTH  EVERY 6 HOURS AS NEEDED (Patient taking differently: Inhale 2 puffs into the lungs every 6 (six) hours as needed for wheezing or shortness of breath.), Disp: 34 g, Rfl: 1   aspirin EC 81 MG tablet, Take 81 mg by mouth daily., Disp: , Rfl:    benzonatate (TESSALON) 200 MG capsule, TAKE 1 CAPSULE BY MOUTH 3 TIMES A DAY AS NEEDED FOR COUGH (Patient taking differently: Take 200 mg by mouth 3 (three) times daily as needed for cough.), Disp: 30 capsule, Rfl: 3   celecoxib (CELEBREX) 200 MG capsule, Take 200 mg by mouth daily., Disp: , Rfl:    Cholecalciferol (VITAMIN D3) 250 MCG (10000 UT) capsule, Take 10,000 Units by mouth 4 (four) times a week., Disp: , Rfl:    clidinium-chlordiazePOXIDE (LIBRAX) 5-2.5 MG capsule, Take 1 capsule by mouth 3 (three) times daily as needed (for IBS)., Disp: , Rfl:    colestipol (COLESTID) 1 g tablet, Take 2 g by mouth daily., Disp: , Rfl:    dicyclomine (BENTYL) 20 MG tablet, Take 20 mg by mouth 4 (four) times daily as needed for spasms., Disp: , Rfl:    doxycycline (VIBRA-TABS) 100 MG tablet, Take 1 tablet (100 mg total) by mouth 2 (two) times daily., Disp: 14 tablet,  Rfl: 0   FLUoxetine (PROZAC) 20 MG capsule, Take 20 mg by mouth daily., Disp: , Rfl:    fluticasone (FLONASE) 50 MCG/ACT nasal spray, SPRAY 2 SPRAYS INTO EACH NOSTRIL EVERY DAY (Patient taking differently: Place 2 sprays into both nostrils daily.), Disp: 48 mL, Rfl: 3   Fluticasone-Umeclidin-Vilant (TRELEGY ELLIPTA) 100-62.5-25 MCG/INH AEPB, Inhale 1 puff into the lungs at bedtime., Disp: 180 each, Rfl: 2   furosemide (LASIX) 40 MG tablet, TAKE 1 TABLET BY MOUTH EVERY DAY, Disp: 30 tablet, Rfl: 7   HYDROcodone-acetaminophen (NORCO) 7.5-325 MG tablet, Take 1 tablet by mouth daily as needed for pain. (Patient not taking: Reported on 04/27/2021), Disp: , Rfl:    ibuprofen (ADVIL) 200 MG tablet, Take 400 mg by mouth 3 (three) times daily., Disp: , Rfl:    ipratropium (ATROVENT) 0.03 % nasal spray, USE 1 TO 2 SPRAYS IN BOTH  NOSTRILS TWICE DAILY, Disp: 90 mL, Rfl: 3   ipratropium-albuterol (DUONEB) 0.5-2.5 (3) MG/3ML SOLN, USE 1 VIAL IN NEBULIZER EVERY 6 HOURS AS NEEDED *J45.901* npi [id number] (Patient taking differently: Inhale 3 mLs into the lungs 3 (three) times daily as needed (Shortness of breath). *J45.901* npi [id number]), Disp: 270 mL, Rfl: 11   KLOR-CON M20 20 MEQ tablet, TAKE 1 TABLET BY MOUTH TWICE A DAY (Patient taking differently: Take 20 mEq by mouth 2 (two) times daily.), Disp: 180 tablet, Rfl: 3   leflunomide (ARAVA) 20 MG tablet, Take 20 mg by mouth daily. , Disp: , Rfl:    levocetirizine (XYZAL) 5 MG tablet, Take 5 mg by mouth daily., Disp: , Rfl:    methocarbamol (ROBAXIN) 750 MG tablet, Take 750 mg by mouth every 6 (six) hours as needed for muscle spasms., Disp: , Rfl:    montelukast (SINGULAIR) 10 MG tablet, TAKE 1 TABLET BY MOUTH AT  BEDTIME, Disp: 90 tablet, Rfl: 3   omeprazole (PRILOSEC) 40 MG capsule, Take 40 mg by mouth at bedtime. , Disp: , Rfl:    OXYGEN, Inhale 4 mLs into the lungs continuous. , Disp: , Rfl:    Pirfenidone (  ESBRIET) 801 MG TABS, Take 801 mg by mouth 3 (three)  times daily., Disp: 270 tablet, Rfl: 1   predniSONE (DELTASONE) 5 MG tablet, Take 5 mg by mouth daily with breakfast., Disp: , Rfl:    rosuvastatin (CRESTOR) 20 MG tablet, TAKE 1 TABLET BY MOUTH  DAILY, Disp: 90 tablet, Rfl: 2   traMADol (ULTRAM) 50 MG tablet, Take 50 mg by mouth 3 (three) times daily as needed for moderate pain., Disp: , Rfl:    traZODone (DESYREL) 50 MG tablet, Take 50 mg by mouth at bedtime., Disp: , Rfl:    verapamil (CALAN-SR) 240 MG CR tablet, TAKE 1 TABLET BY MOUTH  DAILY, Disp: 90 tablet, Rfl: 3      Objective:   There were no vitals filed for this visit.  Estimated body mass index is 40.41 kg/m as calculated from the following:   Height as of 04/27/21: 5' 0.5" (1.537 m).   Weight as of 04/27/21: 210 lb 6.4 oz (95.4 kg).  _0 @  There were no vitals filed for this visit.   Physical Exam Looked normal on o2    Assessment:       ICD-10-CM   1. Chronic respiratory failure with hypoxia (HCC)  J96.11     2. ILD (interstitial lung disease) ([place])  J84.9          Plan:     Patient Instructions  Chronic respiratory failure with hypoxia (HCC) Interstitial lung disease due to connective tissue disease ([place]) High risk medication use Therapeutic drug monitoring REsearch subject - Pulse iNO study - randomized 10/13/20 and rolled over open label 03/29/21  -Cointued Progressive interstitial lung disease.  Currently on maximal therapy. Now needing 6L [place] rest for 60md - Plan ---Continue using oxygen 4 L at rest and 5- 8 L with exertion  -Monitor pulse ox with exertion and keep it over 86% -Continue participation in ILD-pro research registry -Continue pirfenidone but at next visit can evaluate for nintedanib =Continue iNO pulsed device per study protocol for now -Discussed with Dr. AGavin Pound She will consider Actemra -ILD symptom score and simple walking desaturation test on 4-6 L oxygen  at follow-up  Obstructive sleep apnea  Plan -Continue  CPAP  Chrnonic persistnet sinusitis - x 6 weeks    - In flareup at last visit but not at this visit  Plan - continue regular flonase and netti pot and atrovent nasal spray -  Asthma/tracheobronchomalacia -Not in flareup.  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 RChase Caller ( Level 05 visit: Estb 40-54 min in  visit type: video virtual visit  in total care time and counseling or/and coordination of care by this undersigned MD - Dr MBrand Males This includes one or more of the following on this same day 05/24/2021: pre-charting, chart review, note writing, documentation discussion of test results, diagnostic or treatment recommendations, prognosis, risks and benefits of management options, instructions, education, compliance or risk-factor reduction. It excludes time spent by the CMillardor office staff in the care of the patient. Actual time 50 min)   SIGNATURE    Dr. MBrand Males M.D., F.C.C.P,  Pulmonary and Critical Care Medicine Staff Physician, CThurmontDirector - Interstitial Lung Disease  Program  [place]ulmonary [place] [address] Pager: [phone] If no answer  or between  15:00h - 7:00h: call [phone] Telephone: [phone]  5:52 PM 05/24/2021

## 2021-05-26 NOTE — Research (Signed)
Disclaimer: This blurb is a brief overview of the study this patient is participating in. It is for the use of providers caring for the patient. It is not a regulatory declaration, and may or may not reflect all elements of the protocol.  Title:  A Randomized, double-blind, placebo-controlled dose escalation and verification clinical study, to assess the safety and efficacy of pulsed, inhaled nitric oxide (iNO) in subjects at risk of pulmonary hypertension associated with pulmonary fibrosis on long term oxygen therapy (Part 1 and Part 2). Primary Endpoint: The placebo-corrected change for INOpulse in minutes of moderate to vigorous physical activity (MVPA) measured by actigraphy from baseline to month 4.  Protocol Version for  Amendment 7 dated 25Aug 2021 Consent Version for  cohort 3 approved 02Dec2021 Investigator Brochure Version 11.0 approved 02 Mar 2020  Duration of treatment: Study participants will receive iNO 45 mcg/kg IBW/hr versus placebo for 4 months (16 weeks) during Part 1. Then in Part 2: Open Label Extension (OLE) Study participants will be offered open label therapy at iNO 45 mcg/kg IBW/hr after completing the Part 1.  Protocol #: [id number]-001 (REBUILD), ClinicalTrials.gov Identifier: [id number], **Sponsor is [employer], [place], [place] [address])   Key Inclusion Criteria:   Diagnosed with pulmonary fibrosis (all types except sarcoid) by a high resolution CT scan performed in the 6 months prior to screening  Age between 39 and 62 years (inclusive) Historical right heart catheterization (within 3 years) or current Echocardiogram (within 3 months) as assessed by the Investigator/Radiologist confirming low, or intermediate / high probability of pulmonary hypertension Have been using oxygen therapy (at rest or only with exertion; and ? 10 L/min of oxygen supplementation) by nasal cannula for at least 4 weeks prior to the screening run-in period 6MWD ? 100  meters and ? 400 meters at screening and Baseline/Randomization visits. Forced Vital Capacity ? 40% predicted within the last  6 months prior to the screening run-in period. WHO Functional Class II-IV    Key Exclusion Criteria:   Demonstrate symptomatic rebound defined as significant cardiopulmonary instability, such as hypoxemia, bradycardia, tachycardia, systemic hypotension, shortness of breath, near-syncope, and syncope, occurring within 1 hour of acute iNO withdrawal during rebound testing Use of a prostacyclin analogue, guanylate cyclase stimulator, or endothelin-receptor antagonist (ERA) PAH-specific medications regardless of reason for use, except for phospho-diesterase type-5 (PDE5) inhibitors. (The use of PDE5 inhibitors regardless of reason for use is allowed as long as the dose has been stable for at least 3 months prior to screening and there are no plans to adjust the dose during the study) The presence of emphysema unless the extent of fibrotic changes is greater than the extent of emphysema on the most recent HRCT scan.   Chronic use of a nitric oxide donor agent such as nitroglycerin or drugs known to increase methemoglobin such as lidocaine, prilocaine, benzocaine, nitroprusside, isosorbide, or dapsone at Screening Systolic heart failure) with an ejection fraction of < 40%; or severe HF with preserved EF (HFpEF; diastolic HF) as assessed by the Principal Investigator. Smoking within 3 months of Screening and/or unwilling to avoid smoking throughout the study Life expectancy of < 6 months,      Pharmacodynamics:  Nitric oxide is a compound produced by many cells of the body. It relaxes vascular smooth muscle by binding to the heme moiety of cytosolic guanylate cyclase, activating guanylate cyclase and increasing intracellular levels of cGMP, which then leads to vasodilation. When inhaled, NO produces pulmonary vasodilation.  There is no measurable effect  on systemic arterial  pressure.   Pharmacokinetics:  Absorption:  The extent  of absorption of NO was independent of dose and ranged from 85% to 89% with normal respiration and 91% to 93% with maximum respiration. Elimination:  The average elimination half-life of 15N nitrate from plasma ranged from 5.5 hours to 9.8 hours.  Urinary excretion was the primary route of elimination. Nitrate was the predominant metabolite in the urine.   Contraindications:  No contraindications are known for iNO when administered to participants with PAH, ILD, or PH-COPD on LTOT.   Interactions:  Inhaled NO has been demonstrated to potentiate the effect of lowering PAP when used with vasodilators to treat PAH. Currently approved treatments include phosphodiesterase type 5 (PDE-5) inhibitors (); prostacyclin analogs and receptor agonists (; and endothelin receptor antagonists (ERAs).   Safety Data:  Pulsed iNO/ INOpulse was well tolerated. . The most frequently occurring AE, epistaxis, occurred in similar proportions of study participants in  the 3 dose cohorts (~26-27%).  Dyspnea and peripheral edema  occurred more frequently in the 2 iNO dose cohorts than in the placebo cohort.      Based upon clinical studies, the following events are considered adverse reactions:  Methemoglobinemia  Pulmonary edema associated with pre-existing LVD  Signs or symptoms of symptomatic rebound associated with acute withdrawal of iNO therapy, i.e., within 1 hour of withdrawal of therapy, including: significant cardiopulmonary instability such as systemic arterial O2 desaturation, hypoxemia, bradycardia, tachycardia, systemic hypotension, shortness of breath, near- syncope, syncope, ventricular fibrillation, or cardiac arrest.  [facility] [place] Coordinator note :   This visit for [place] with DOB: [birth year] on 05/24/2021 for the above protocol is Visit/Encounter #OLE Week 16 and is for purpose of research.    Subject expressed continued interest and consent in continuing as a study subject. Subject confirmed that there was no change in contact information (e.g. address, telephone, email). Subject thanked for participation in research and contribution to science.    The Subject was informed that the PI [name] continues to have oversight of the subject's visits and course  through relevant discussions, reviews and also specifically of this visit by routing of this note to the [place].   During this visit on 05/24/2021, the subject completed all visit procedures per the above listed protocol.  For complete details, please see the subject's source binder.    Signed by  [place] Assistant [facility]  [place], [place] 10:50 AM 05/26/2021   Title: Chronic Fibrosing Interstitial Lung Disease with Progressive Phenotype Prospective Outcomes (ILD-PRO) Registry    Protocol #: IPF-PRO-SUB, Clinical Trials # [id number], Sponsor: [employer]   Protocol Version Amendment 4 dated 12Sep2019  and confirmed current on  Consent Version for today's visit date of  Is [place] IRB Approved Version 21 Jun 2018 Revised 21 Jun 2018   Objectives:  Describe current approaches to diagnosis and treatment of chronic fibrosing ILDs with progressive phenotype  Describe the natural history of chronic fibrosing ILDs with progressive phenotype  Assess quality of life from self-administered participant reported questionnaires for each disease group  Describe participant interactions with the healthcare system, describe treatment practices across multiple institutions for each disease group  Collect biological samples linked to well characterized chronic fibrosing ILDs with progressive phenotype to identify disease biomarkers  Collect data and biological samples that will support future research studies.  Key Inclusion Criteria: Willing and  able to provide informed consent  Age ? 30 years  Diagnosis of a non-IPF ILD of any duration, including, but not limited to Idiopathic Non-Specific Interstitial Pneumonia (INSIP), Unclassifiable Idiopathic Interstitial Pneumonias (IIPs), Interstitial Pneumonia with Autoimmune Features (IPAF), Autoimmune ILDs such as Rheumatoid Arthritis (RA-ILD) and Systemic Sclerosis (SSC-ILD), Chronic Hypersensitivity Pneumonitis (HP), Sarcoidosis or Exposure-related ILDs such as asbestosis.  Chronic fibrosing ILD defined by reticular abnormality with traction bronchiectasis with or without honeycombing confirmed by chest HRCT scan and/or lung biopsy.  Progressive phenotype as defined by fulfilling at least one of the criteria below of fibrotic changes (progression set point) within the last 24 months regardless of treatment considered appropriate in individual ILDs:  decline in FVC % predicted (% pred) based on >10% relative decline  decline in FVC % pred based on ? 5 - <10% relative decline in FVC combined with worsening of respiratory symptoms as assessed by the site investigator  decline in FVC % pred based on ? 5 - <10% relative decline in FVC combined with increasing extent of fibrotic changes on chest imaging (HRCT scan) as assessed by the site investigator  decline in DLCO % pred based on ? 10% relative decline  worsening of respiratory symptoms as well as increasing extent of fibrotic changes on chest imaging (HRCT scan) as assessed by the site investigator independent of FVC change.     Key Exclusion Criteria: Malignancy, treated or untreated, other than skin or early stage prostate cancer, within the past 5 years  Currently listed for lung transplantation at the time of enrollment  Currently enrolled in a clinical trial at the time of enrollment in this registry       Clinical Research Coordinator / Research RN note : This visit for Subject [id number] with DOB: [birth year] on 05/24/2021 for the above  protocol is Visit/Encounter 4 and is for purpose of research.    Subject expressed continued interest and consent in continuing as a study subject. Subject confirmed that there was no change in contact information (e.g. address, telephone, email). Subject thanked for participation in research and contribution to science.      During this visit on , the subject completed the blood work and questionnaires per the above referenced protocol. Please refer to the subject's paper source binder for further details.   Signed by Reino Bellis Research Assistant [facility]  [place], [place]

## 2021-07-13 ENCOUNTER — Telehealth: Payer: Self-pay | Admitting: Internal Medicine

## 2021-07-14 MED ORDER — MONTELUKAST SODIUM 10 MG PO TABS: 10.0000 mg | ORAL_TABLET | Freq: Every day | ORAL | 3 refills | Status: DC

## 2021-07-14 MED ORDER — TRELEGY ELLIPTA 100-62.5-25 MCG/ACT IN AEPB: 1.0000 | INHALATION_SPRAY | Freq: Every day | RESPIRATORY_TRACT | 5 refills | Status: DC

## 2021-07-14 MED ORDER — IPRATROPIUM BROMIDE 0.03 % NA SOLN: NASAL | 3 refills | Status: DC

## 2021-07-14 NOTE — Telephone Encounter (Signed)
I called the patient to let her know that her medications have been sent to the pharmacy. She did not have any questions. Nothing further needed.

## 2021-07-15 ENCOUNTER — Other Ambulatory Visit: Payer: Self-pay | Admitting: *Deleted

## 2021-07-15 ENCOUNTER — Other Ambulatory Visit: Payer: Self-pay | Admitting: Internal Medicine

## 2021-07-15 DIAGNOSIS — I2510 Atherosclerotic heart disease of native coronary artery without angina pectoris: Secondary | ICD-10-CM

## 2021-07-15 DIAGNOSIS — J849 Interstitial pulmonary disease, unspecified: Secondary | ICD-10-CM

## 2021-07-15 MED ORDER — PIRFENIDONE 801 MG PO TABS: 801.0000 mg | ORAL_TABLET | Freq: Three times a day (TID) | ORAL | 3 refills | Status: DC

## 2021-08-16 ENCOUNTER — Telehealth: Payer: Self-pay | Admitting: Internal Medicine

## 2021-08-17 MED ORDER — IPRATROPIUM-ALBUTEROL 0.5-2.5 (3) MG/3ML IN SOLN: RESPIRATORY_TRACT | 11 refills | Status: DC

## 2021-08-17 MED ORDER — BENZONATATE 200 MG PO CAPS: 200.0000 mg | ORAL_CAPSULE | Freq: Three times a day (TID) | ORAL | 2 refills | Status: DC | PRN

## 2021-08-17 NOTE — Telephone Encounter (Signed)
Ok to refill teassalon perles 242m tid prn for chronic cough.

## 2021-08-17 NOTE — Telephone Encounter (Signed)
[name] may we refill pt's Tessalon pearls?

## 2021-08-17 NOTE — Telephone Encounter (Signed)
I have called the patient and she is aware that her refills have been approved. Nothing further needed.

## 2021-09-08 ENCOUNTER — Other Ambulatory Visit: Payer: Self-pay

## 2021-09-08 ENCOUNTER — Encounter: Payer: Self-pay | Admitting: Internal Medicine

## 2021-09-08 ENCOUNTER — Ambulatory Visit (INDEPENDENT_AMBULATORY_CARE_PROVIDER_SITE_OTHER): Payer: Medicare Other | Admitting: Internal Medicine

## 2021-09-08 VITALS — BP 110/70 | HR 77 | Ht 61.0 in | Wt 204.0 lb

## 2021-09-08 DIAGNOSIS — J849 Interstitial pulmonary disease, unspecified: Secondary | ICD-10-CM | POA: Diagnosis not present

## 2021-09-08 DIAGNOSIS — I2510 Atherosclerotic heart disease of native coronary artery without angina pectoris: Secondary | ICD-10-CM

## 2021-09-08 DIAGNOSIS — I5032 Chronic diastolic (congestive) heart failure: Secondary | ICD-10-CM | POA: Diagnosis not present

## 2021-09-08 DIAGNOSIS — I272 Pulmonary hypertension, unspecified: Secondary | ICD-10-CM | POA: Diagnosis not present

## 2021-09-08 DIAGNOSIS — J9611 Chronic respiratory failure with hypoxia: Secondary | ICD-10-CM

## 2021-09-08 NOTE — Patient Instructions (Signed)
Medication Instructions:   Your physician has recommended you make the following change in your medication:   CHANGE Furosemide (Lasix) 40 mg TWICE daily until swelling improves,  THEN resume Furosemide (Lasix) 40 mg DAILY   *If you need a refill on your cardiac medications before your next appointment, please call your pharmacy*   Lab Work:  We will request most recent labs from your PCP  Testing/Procedures:  None ordered   Follow-Up: At [facility], you and your health needs are our priority.  As part of our continuing mission to provide you with exceptional heart care, we have created designated Provider Care Teams.  These Care Teams include your primary Cardiologist (physician) and Advanced Practice Providers (APPs -  Physician Assistants and Nurse Practitioners) who all work together to provide you with the care you need, when you need it.  We recommend signing up for the patient portal called "MyChart".  Sign up information is provided on this After Visit Summary.  MyChart is used to connect with patients for Virtual Visits (Telemedicine).  Patients are able to view lab/test results, encounter notes, upcoming appointments, etc.  Non-urgent messages can be sent to your provider as well.   To learn more about what you can do with MyChart, go to [email or web address].    Your next appointment:   6 month(s)  The format for your next appointment:   In Person  Provider:   You may see Nelva Bush, MD or one of the following Advanced Practice Providers on your designated Care Team:   Murray Hodgkins, NP Christell Faith, PA-C Cadence Kathlen Mody, [place]

## 2021-09-08 NOTE — Progress Notes (Signed)
Follow-up Outpatient Visit Date: 09/08/2021  Primary Care Provider: Lorene Dy, MD [address] [place] [address]  Chief Complaint: Follow-up shortness of breath and coronary artery disease  HPI:  Ms. Laakso is a 76 y.o. female with history of nonobstructive coronary artery disease, asthmatic bronchitis, interstitial lung disease, GERD, rheumatoid arthritis, CVA, prediabetes, IBS, and allergic rhinitis, who presents for follow-up of shortness of breath and nonobstructive CAD.  I last saw her a year ago, at which time Ms. Yearsley was feeling relatively well.  She reported stable exertional dyspnea with modest activity and was using around-the-clock supplemental oxygen.  She underwent right heart catheterization with Dr. Haroldine Laws in 10/2020, which showed mild-moderate mixed pulmonary hypertension with high cardiac output and mildly elevated filling pressures.  She last saw 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  Today, Ms. Havel feels okay.  She still has significant shortness of breath and is now using 5-6 L of oxygen around-the-clock.  Her medications have been adjusted over the last few months by Drs. Ramaswamy and O'Brien.  She does not have any chest pain, palpitations, or lightheadedness.  She has experienced more swelling in her legs, especially about the left ankle.  She attributes this to eating out most days and not watching her salt intake.  She is still taking furosemide 40 mg daily but asks about taking additional doses for leg swelling.  --------------------------------------------------------------------------------------------------  Past Medical History:  Diagnosis Date   Acute asthmatic bronchitis    Allergic rhinitis    Anxiety    Arthritis    Esophageal reflux    Hypertension    Interstitial lung disease ([place]) dx jan 2020   Irritable bowel syndrome    Oxygen dependent    4 liters day time 6 liters  at night   PONV (postoperative nausea and vomiting)    ponv likes zofran, and scopolamine patch   Rheumatoid arthritis(714.0)    Sleep apnea    Past Surgical History:  Procedure Laterality Date    c secttion  1977   2 foot fusions Left    total of 6 left foot sx   ABDOMINAL HYSTERECTOMY     complete    ANKLE FUSION  2009   left   BACK SURGERY     lower l to l 5 fused   CHOLECYSTECTOMY     RIGHT HEART CATH N/A 11/09/2020   Procedure: RIGHT HEART CATH;  Surgeon: Jolaine Artist, MD;  Location: [facility];  Service: Cardiovascular;  Laterality: N/A;   RIGHT/LEFT HEART CATH AND CORONARY ANGIOGRAPHY N/A 09/16/2016   Procedure: Right/Left Heart Cath and Coronary Angiography;  Surgeon: Nelva Bush, MD;  Location: [facility];  Service: Cardiovascular;  Laterality: N/A;   TONSILLECTOMY     TOTAL HIP ARTHROPLASTY Right 12/20/2018   Procedure: TOTAL HIP ARTHROPLASTY ANTERIOR APPROACH;  Surgeon: Paralee Cancel, MD;  Location: [facility];  Service: Orthopedics;  Laterality: Right;  70 mins   TOTAL KNEE ARTHROPLASTY Bilateral     Current Meds  Medication Sig   albuterol (VENTOLIN HFA) 108 (90 Base) MCG/ACT inhaler USE 2 INHALATIONS BY MOUTH  EVERY 6 HOURS AS NEEDED   aspirin EC 81 MG tablet Take 81 mg by mouth daily.   benzonatate (TESSALON) 200 MG capsule Take 1 capsule (200 mg total) by mouth 3 (three) times daily as needed for cough.   celecoxib (CELEBREX) 200 MG capsule Take 200 mg by mouth daily.  cetirizine (ZYRTEC) 10 MG tablet Take 20 mg by mouth daily.   Cholecalciferol (VITAMIN D3) 250 MCG (10000 UT) capsule Take 10,000 Units by mouth 4 (four) times a week.   clidinium-chlordiazePOXIDE (LIBRAX) 5-2.5 MG capsule Take 1 capsule by mouth 3 (three) times daily as needed (for IBS).   colestipol (COLESTID) 1 g tablet Take 2 g by mouth daily.   dicyclomine (BENTYL) 20 MG tablet Take 20 mg by mouth 4 (four) times daily as needed for spasms.   FLUoxetine (PROZAC) 20 MG  capsule Take 20 mg by mouth daily.   fluticasone (FLONASE) 50 MCG/ACT nasal spray SPRAY 2 SPRAYS INTO EACH NOSTRIL EVERY DAY   Fluticasone-Umeclidin-Vilant (TRELEGY ELLIPTA) 100-62.5-25 MCG/ACT AEPB Inhale 1 puff into the lungs daily.   furosemide (LASIX) 40 MG tablet TAKE 1 TABLET BY MOUTH EVERY DAY   ipratropium (ATROVENT) 0.03 % nasal spray Use 1-2 sprays in both nostrils twice daily.   ipratropium-albuterol (DUONEB) 0.5-2.5 (3) MG/3ML SOLN USE 1 VIAL IN NEBULIZER EVERY 6 HOURS AS NEEDED J45.901   KLOR-CON M20 20 MEQ tablet TAKE 1 TABLET BY MOUTH TWICE A DAY   leflunomide (ARAVA) 20 MG tablet Take 20 mg by mouth daily.    methocarbamol (ROBAXIN) 750 MG tablet Take 750 mg by mouth every 6 (six) hours as needed for muscle spasms.   montelukast (SINGULAIR) 10 MG tablet Take 1 tablet (10 mg total) by mouth at bedtime.   omeprazole (PRILOSEC) 40 MG capsule Take 40 mg by mouth at bedtime.    OXYGEN Inhale 4 mLs into the lungs continuous.    Pirfenidone (ESBRIET) 801 MG TABS Take 801 mg by mouth 3 (three) times daily.   predniSONE (DELTASONE) 5 MG tablet Take 5 mg by mouth daily with breakfast.   rosuvastatin (CRESTOR) 20 MG tablet Take 1 tablet (20 mg total) by mouth daily. PLEASE SCHEDULE OFFICE VISIT FOR FURTHER REFILLS. THANK YOU!   Tocilizumab (ACTEMRA IV) Inject into the vein every 30 (thirty) days. infusion   traMADol (ULTRAM) 50 MG tablet Take 50 mg by mouth 3 (three) times daily as needed for moderate pain.   traZODone (DESYREL) 50 MG tablet Take 50 mg by mouth at bedtime.   verapamil (CALAN-SR) 240 MG CR tablet TAKE 1 TABLET BY MOUTH  DAILY    Allergies: Cefdinir; Erythromycin base; Lactose; Lactulose; Mesalamine; Methotrexate; Nitrofuran derivatives; Other; Sulfa antibiotics; Tdap [tetanus-diphth-acell pertussis]; and Tetanus toxoid, adsorbed  Social History   Tobacco Use   Smoking status: Never   Smokeless tobacco: Never   Tobacco comments:    positive passive tobacco smoke  exposure  Vaping Use   Vaping Use: Never used  Substance Use Topics   Alcohol use: Yes    Comment: glass of wine each night    Drug use: No    Family History  Problem Relation Age of Onset   Heart disease Mother    Arthritis Mother    Heart attack Father    Diabetes Other        sibling   Heart attack Other        sibling    Review of Systems: A 12-system review of systems was performed and was negative except as noted in the HPI.  --------------------------------------------------------------------------------------------------  Physical Exam: BP 110/70 (BP Location: Right Arm, Patient Position: Sitting, Cuff Size: Large)    Pulse 77    Ht _0  (1.549 m)    Wt 204 lb (92.5 kg)    SpO2 96% Comment: on 5 L O2  BMI 38.55 kg/m   General:  NAD.  Accompanied by her husband. Neck: No JVD or HJR. Lungs: Coarse breath sounds bilaterally. Heart: Distant heart sounds.  Regular rate and rhythm without murmurs, rubs, or gallops. Abdomen: Soft, nontender, nondistended. Extremities: Trace right and 1+ left ankle edema.  EKG: Normal sinus rhythm with poor R wave progression.  No significant change from prior tracing on 11/09/2020.  Lab Results  Component Value Date   WBC 7.4 04/27/2021   HGB 11.4 (L) 04/27/2021   HCT 36.6 04/27/2021   MCV 98.8 04/27/2021   PLT 214.0 04/27/2021    Lab Results  Component Value Date   NA 143 11/09/2020   K 3.6 11/09/2020   CL 106 11/09/2020   CO2 28 11/09/2020   BUN 18 11/09/2020   CREATININE 0.70 11/09/2020   GLUCOSE 88 11/09/2020   ALT 31 08/19/2020    Lab Results  Component Value Date   CHOL 135 09/02/2020   HDL 74 09/02/2020   LDLCALC 40 09/02/2020   LDLDIRECT 53 01/09/2017   TRIG 122 09/02/2020   CHOLHDL 1.8 09/02/2020    --------------------------------------------------------------------------------------------------  ASSESSMENT AND PLAN: Chronic HFpEF and pulmonary hypertension: Ms. Henckel appears little more volume  overloaded today with 1+ edema in her left ankle and trace edema on the right.  Some of this may be driven to increase sodium intake.  I have advised her to increase furosemide to 40 mg twice daily until her swelling resolves, at which time she should return back to 40 mg daily.  Continue current dose of verapamil for heart rate/blood pressure control.  We will request her most recent labs from Dr. Mancel Bale.  Continue ongoing management of ILD and PAH per [name].  Coronary artery disease: No angina reported.  Catheterization in 2018 showed nonobstructive CAD.  We will continue medical therapy to prevent progression of disease as well as secondary prevention in the setting of prior stroke with aspirin and rosuvastatin.  We will request most recent labs from Dr. Mancel Bale to ensure that her lipids are adequately controlled.  Interstitial lung disease and chronic respiratory failure with hypoxia: Per [name].  Follow-up: Return to clinic in 6 months.  Nelva Bush, MD 09/08/2021 10:58 AM

## 2021-09-16 ENCOUNTER — Ambulatory Visit (INDEPENDENT_AMBULATORY_CARE_PROVIDER_SITE_OTHER): Payer: Medicare Other | Admitting: Internal Medicine

## 2021-09-16 ENCOUNTER — Telehealth: Payer: Self-pay | Admitting: Internal Medicine

## 2021-09-16 ENCOUNTER — Encounter: Payer: Self-pay | Admitting: Internal Medicine

## 2021-09-16 ENCOUNTER — Other Ambulatory Visit: Payer: Self-pay

## 2021-09-16 ENCOUNTER — Encounter: Payer: Medicare Other | Admitting: *Deleted

## 2021-09-16 VITALS — BP 124/70 | HR 85 | Temp 98.1°F | Ht 61.0 in | Wt 198.6 lb

## 2021-09-16 DIAGNOSIS — J9611 Chronic respiratory failure with hypoxia: Secondary | ICD-10-CM | POA: Diagnosis not present

## 2021-09-16 DIAGNOSIS — J849 Interstitial pulmonary disease, unspecified: Secondary | ICD-10-CM

## 2021-09-16 DIAGNOSIS — Z79899 Other long term (current) drug therapy: Secondary | ICD-10-CM | POA: Diagnosis not present

## 2021-09-16 DIAGNOSIS — Z5181 Encounter for therapeutic drug level monitoring: Secondary | ICD-10-CM

## 2021-09-16 DIAGNOSIS — I2510 Atherosclerotic heart disease of native coronary artery without angina pectoris: Secondary | ICD-10-CM | POA: Diagnosis not present

## 2021-09-16 DIAGNOSIS — Z006 Encounter for examination for normal comparison and control in clinical research program: Secondary | ICD-10-CM

## 2021-09-16 LAB — HEPATIC FUNCTION PANEL
ALT: 13 U/L (ref 0–35)
AST: 13 U/L (ref 0–37)
Albumin: 4.2 g/dL (ref 3.5–5.2)
Alkaline Phosphatase: 71 U/L (ref 39–117)
Bilirubin, Direct: 0.1 mg/dL (ref 0.0–0.3)
Total Bilirubin: 0.5 mg/dL (ref 0.2–1.2)
Total Protein: 6.2 g/dL (ref 6.0–8.3)

## 2021-09-16 NOTE — Telephone Encounter (Cosign Needed)
[name], Subject [phone], withdrew from REBUILD study on 08/24/2021. Before withdrawing subject discussed with myself that she had been feeling sick and felt that it could be associated with study drug. She did not want to continue on the Open Label Extension or come in for any more study visits. I had a discussion with PI about patient's issues and withdrawal and communicated with the study monitor. I was advised it was withdrawal of consent and no End of Study visit needed to be completed. Please see subject binder for further information.  ?

## 2021-09-16 NOTE — Research (Signed)
Title: Chronic Fibrosing Interstitial Lung Disease with Progressive Phenotype Prospective Outcomes (ILD-PRO) Registry  ?  ?Protocol #: IPF-PRO-SUB, Clinical Trials # [id number], Sponsor: [employer] ?  ?Protocol Version Amendment 4 dated 12Sep2019  and confirmed current on  ?Consent Version for today?s visit date of  Is [facility] IRB Approved Version 21 Jun 2018 Revised 21 Jun 2018 ?  ?Objectives:  ?Describe current approaches to diagnosis and treatment of chronic fibrosing ILDs with progressive phenotype  ?Describe the natural history of chronic fibrosing ILDs with progressive phenotype  ?Assess quality of life from self-administered participant reported questionnaires for each disease group  ?Describe participant interactions with the healthcare system, describe treatment practices across multiple institutions for each disease group  ?Collect biological samples linked to well characterized chronic fibrosing ILDs with progressive phenotype to identify disease biomarkers  ?Collect data and biological samples that will support future research studies.  ?                                          ?Key Inclusion Criteria: ?Willing and able to provide informed consent  ?Age ? 30 years  ?Diagnosis of a non-IPF ILD of any duration, including, but not limited to Idiopathic Non-Specific Interstitial Pneumonia (INSIP), Unclassifiable Idiopathic Interstitial Pneumonias (IIPs), Interstitial Pneumonia with Autoimmune Features (IPAF), Autoimmune ILDs such as Rheumatoid Arthritis (RA-ILD) and Systemic Sclerosis (SSC-ILD), Chronic Hypersensitivity Pneumonitis (HP), Sarcoidosis or Exposure-related ILDs such as asbestosis.  ?Chronic fibrosing ILD defined by reticular abnormality with traction bronchiectasis with or without honeycombing confirmed by chest HRCT scan and/or lung biopsy.  ?Progressive phenotype as defined by fulfilling at least one of the criteria below of fibrotic changes (progression set point)  within the last 24 months regardless of treatment considered appropriate in individual ILDs:  ?decline in FVC % predicted (% pred) based on >10% relative decline  ?decline in FVC % pred based on ? 5 - <10% relative decline in FVC combined with worsening of respiratory symptoms as assessed by the site investigator  ?decline in FVC % pred based on ? 5 - <10% relative decline in FVC combined with increasing extent of fibrotic changes on chest imaging (HRCT scan) as assessed by the site investigator  ?decline in DLCO % pred based on ? 10% relative decline  ?worsening of respiratory symptoms as well as increasing extent of fibrotic changes on chest imaging (HRCT scan) as assessed by the site investigator independent of FVC change.   ?  ?Key Exclusion Criteria: ?Malignancy, treated or untreated, other than skin or early stage prostate cancer, within the past 5 years  ?Currently listed for lung transplantation at the time of enrollment  ?Currently enrolled in a clinical trial at the time of enrollment in this registry   ?  ?  ?Clinical Research Coordinator / Research RN note : This visit for Fedora [phone] with DOB: [id number] on 09/16/2021 for the above protocol is Visit/Encounter #5 and is for purpose of research.  ?  ?Subject expressed continued interest and consent in continuing as a study subject. Subject confirmed that there was no change in contact information (e.g. address, telephone, email). Subject thanked for participation in research and contribution to science.   ?  ?During this visit on 09/16/2021 , the subject completed the blood work and questionnaires per the above referenced protocol. Please refer to the subject's paper source binder for further details. ?  ?  Signed by ?Leda Gauze [place] Frieden ?Research Assistant ?[facility]  ?[place], [place] ?02:22 PM 09/16/2021 ?  ?

## 2021-09-16 NOTE — Patient Instructions (Addendum)
Chronic respiratory failure with hypoxia (HCC) ?Interstitial lung disease due to connective tissue disease ([place]) ?High risk medication use ?Therapeutic drug monitoring ?REsearch subject - Pulse iNO study - randomized 10/13/20 and rolled over open label 03/29/21 -.withdrew 08/24/21 ? ?-Progressive interstitial lung disease - clinically stable since visit Nov 2022.   Now needing 6L [place] rest for 73md ?- ?Plan ?---Continue using oxygen 6 L at rest and 5- 8 L with exertion ? -Monitor pulse ox with exertion and keep it over 86% ?-Continue participation in ILD-pro research registry ?-Continue pirfenidone  ? - check LFT 09/16/2021 ?=Respect withdrawal from iNO pulsed device study 08/24/21 ?- Continue RA Rx with Dr. AGavin Pound ?-  Actemra, leflunomade, prednisone, flonase, celebrex ?- do spirometry and dlco in 2-3 months ? ?Obstructive sleep apnea ? ?Plan ?-Continue CPAP ? ?Chrnonic persistnet sinusitis  ? ? - In control ? ? ?Plan ?- continue regular flonase and netti pot and atrovent nasal spray snf cetrizine ?-  ?Asthma/tracheobronchomalacia ?-Not in flareup.   ? ?Plan ? - continue trelegy scheduled ? - use alb as needed ? ? ?Followup ?- 2-3 month s ace to face -30-minute visit Dr. RChase Callerbut after spirometry and dlco ? - symptoms socre and pulse ox on room air at rest and walk test on 5-6L [place] at followup ?

## 2021-09-16 NOTE — Progress Notes (Signed)
05/21/2018- 76 year old female never smoker followed for allergic rhinitis, rhinosinusitis, asthmatic bronchitis, ILD, OSA,complicated by GERD/Crohn's, Rheumatoid arthritis/prednisone -----4 month follow up for asthma and OSA. Per patient she is still having issues with SOB. She has had 2 foot surgeries since her last visit. States that the [name] is working but [name] wanted to switch her to something else. Unsure of the medication name and I did not see it in his last OV.  Singulair, and Anoro, neb duoneb CPAP auto 5-15/Advanced   Download 100% compliance AHI 1.3/hour. I discussed FENO and she will try Symbicort. Discussed inhaled steroids.  We need to get her a new AeroChamber. Has been on sustained antibiotics for failed appliance in her foot. Notices some runny nose and scant bland mucus.  Wheezing has been well controlled.  Little cough.  She and her husband recognize inability to get any exercise as a substantial reason for dyspnea.  No acute events.  Cardiology continues to follow, including her pulmonary hypertension.  We reviewed her most recent chest CT and discussed implications of progressive interstitial fibrosis.  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  I would like to refer her to [name] for his opinion and advice. FENO 03/15/18  49 H CT chest Hi Res 02/02/2018- IMPRESSION: 1. Spectrum of findings compatible with fibrotic interstitial lung disease with a slight basilar predominance, with mild progression since 2017. 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 2. Mild patchy air trapping, unchanged, indicative of small airways disease. 3. Left main and two-vessel coronary atherosclerosis. 4. Stable dilated main pulmonary artery, suggesting chronic pulmonary arterial hypertension. Aortic Atherosclerosis (ICD10-I70.0).  06/08/18-  76 year old female never smoker followed for allergic rhinitis, rhinosinusitis, asthmatic bronchitis, ILD, OSA,complicated by GERD/Crohn's, Rheumatoid arthritis/prednisone -----coughing for 2 weeks, yellow, green mucus, SOB  Singulair, Atrovent nasal spray, Symbicort 160, and Anoro, neb albuterol, neb DuoNeb, Complains of increased cough with chest congestion, green/yellow sputum, low-grade fever 99 degrees over the last 2 weeks.  Not on prednisone now.  Last antibiotic in October. Pending ILD consultation with [name] in December.   07/02/2018  - Visit   76 year old female patient presenting today for acute visit.  Patient was recently treated with a course of Augmentin and reports sputum color is now light green but has decreased in sputum amount since last office visit.  Patient does not feel she is getting better.  Patient remains adherent to Anoro Ellipta.  On arrival to our office today patient was short of breath and oxygen saturations were 81%.  With deep breathing patient was able to increase oxygenation to 85%.  When placed on 2 L O2 patient was able to maintain at 91%.  Patient had previously been on oxygen in 2015 but this was later [facility].  Patient's weight has also increased over the previous office visits, patient remains adherent to diuretic treatment plan as on taking 1 diuretic daily.  Patient reports she is allowed to take an additional diuretic when she feels necessary.  Unfortunately patient has not been weighing herself regularly there is no set plan to when she should take her second diuretic.  Patient is exceptionally sedentary at home and rarely gets out of her bed or lazy boy chair which is about 3 feet from her bed.  Patient's husband takes care of patient completely including getting drinks and food so patient does not have to leave her lazy  boy recliner.  Patient watches many television shows and does not report much activity.   Patient has completed follow-up with  rheumatology last week and no changes to her medications were made.  Patient reports that she was stable at the time.  CPAP compliance report showing 30 out of 30 days used.  All 30 those days greater than 4 hours.  APAP settings 5-15.  Average usage days 7 hours and 56 minutes.  AHI 0.8.    OV 07/31/2018 -referred to interstitial lung disease clinic  Subjective:  [name], female , DOB: [birth year] , age 1 y.o. , MRN: [id number] , ADDRESS: [address] [place] [address]   07/31/2018 -   Chief Complaint  Patient presents with   Follow-up    Pt of Dr. Annamaria [name] that stated to be referred to ILD clinic.  Pt currently has complaints of wheezing which she has had since December 2019, worsening SOB when moving around and also states she has a pain in left side of chest near shoulder. Pt wears between 2-4L O2 but mainly has been wearing 4L at home.      HPI [name] 76 y.o. -accompanied by her husband.  Referred by [name] pulmonologist in our practice for evaluation of interstitial lung disease in the setting of rheumatoid arthritis.  History is gained from her, talking to her husband, review of recent office visits and also the interstitial lung disease questionnaire.  As best as I can gather   [place] ILD Questionnaire  Symptoms: She is known to have ILD on a CT chest in 2017 but she is not aware of it.  She says that she follows normally for sleep apnea and asthma with [name].  She recollects that approximately 11 or 12 years ago she was hospitalized for pneumonia and was on oxygen for short while not otherwise specified.  Then came off oxygen.  After that overall she is been stable and sedentary using her CPAP.  She is mostly sedentary because of obesity, rheumatoid arthritis and also because of the nonhealing ulcer in her feet for which she is been advised sedentary lifestyle to enable healing.  Most recently to  Thanksgiving 2019 she was seen for respiratory exacerbation and given antibiotics and prednisone.  She followed up mid December 2019 and was found to have new onset hypoxemia 85% on room air.  She was then discharged on portable oxygen which she is using 4 L nasal cannula at rest.  She tells me that at home when she comes off of CPAP she is in her 43s on room air at rest.  In fact in the office today she is 83% on room air at rest but 93% on 2 L nasal cannula at rest.  There is concerned that she has worsening ILD particularly summer 2019 on her CT chest had progressive findings compared to 2 years earlier.  The presumption is that this ILD is caused by rheumatoid arthritis.  In the 2019 CT chest CT is reported as probable UIP but in my personal visualization is either indeterminate or alternative diagnosis.  In terms of symptoms: She reports insidious onset of shortness of breath for some years and gradually getting worse.  Level 2 dyspnea at rest.  Level for dyspnea taking a shower.  Level 5 dyspnea walking at her own pace of walking with others of her age and walking up stairs or walking up a hill.  She does  have a cough with yellow and green sputum.  She does cough at night.  There is also wheezing  Past Medical History : She has longstanding history of rheumatoid arthritis since the 1980s.  Used to be followed by Dr. Danie Binder.  As best as I can gather she used to be on methotrexate maybe 10 or 20 years ago.  She took it for a short while and then stopped it because of diarrhea.  She took this for less than 1 year.  She believes that after that she was basically on pain management.  She is only been on prednisone chronic intake for a total of 1 year approximately 4 to 5 years ago.  Around this time Dr. Hoyt Koch retired and she started seeing Dr. Gavin Pound.  Since then she is been on Arava and Plaquenil.  She has never seen any other immunomodulators in the setting of rheumatoid arthritis  according to history.  Overall the rheumatoid arthritis has caused some disability with deformed joints in her hands and also her using cane.    She is not on any anticoagulation is never had any blood clots.  In 2017 she had vascular extremity duplex venous that was negative for DVT.  Most recently she is been dealing with nonhealing ulcer of the left toe/metatarsal .  She had August and September 2018.  And then 1 in the latter part of 2019.  Most recently she says she has had a duplex lower extremity venous.  I do not have access to these results.  She is waiting on this.  Was done in the last few to several days.   She also has a longstanding history of asthma for which she is on Singulair.  Exam nitric oxide was elevated in August 2019 at 49 ppb but 07/31/2018 I snormal in the 20s though she is wheezing   She has nonobstructive coronary artery disease and in March 2018 had a right heart catheterization with elevated pulmonary pressures but also slightly elevated wedge pressure [see below].  She is started on Lasix  She has sleep apnea for which she uses CPAP.  She has obesity  ROS: Positive for arthralgia and arthritis.  Dry eyes and dry mouth but otherwise negative   FAMILY HISTORY of LUNG DISEASE: Denies   EXPOSURE HISTORY: Never smoked any cigarettes, marijuana, vaping, cocaine or intravenous drug use   HOME and HOBBY DETAILS : [place] lived [place] for 8 years.  No mold.  No dampness.  Does use humidifier and does use CPAP but there is no mold in it.  Also uses nebulizer machine but there is no mold.  No pet birds no musical instruments no guarding habits   OCCUPATIONAL HISTORY (122 questions) : Denies   PULMONARY TOXICITY HISTORY (27 items): She has taken nitrofurantoin 9 years ago.  Has not taken methotrexate for a year 5 years ago and is taken sulfasalazine       Results for SAKARI, RAISANEN (MRN [id number]) as of 07/31/2018 16:31  Ref. Range  09/03/2015 12:45 02/21/2017  FVC-Pre Latest Units: L 2.18 1.7L  FVC-%Pred-Pre Latest Units: % 84 66%   Results for JANYLAH, BELGRAVE (MRN [id number]) as of 07/31/2018 16:31  Ref. Range 09/03/2015 12:45  DLCO cor Latest Units: ml/min/mmHg 14.06  DLCO cor % pred Latest Units: % 69   HEART CATH MARCH 2018 Right Heart Pressures RA (mean): 13 mmHg RV (S/EDP): 38/12 mmHg PA (S/D, mean): 43/23 (32) mmHg PCWP (mean): 16  mmHg    Conclusions: Mild to moderate, non-obstructive coronary artery disease with 50% mid LAD and 20% proximal RCA stenoses. Mild to moderate pulmonary hypertension with mildly elevated right heart filling pressures. Upper normal left heart filling pressures. Normal Fick cardiac output/index. Equalization of end-diastolic pressures with ventricular concordance. This can be seen in the setting of restrictive process.   Recommendations: Escalate statin therapy to prevent progression of CAD. Increase furosemide to 40 mg BID and KCl to 40 mg BID. Patient to have BMP in 1 week to evaluate renal function and potassium. Continue outpatient pulmonary follow-up. I suspect underlying lung disease is the driving force behind the patient's dyspnea, pulmonary hypertension, and elevated right heart pressures. Outpatient follow-up in cardiology clinic in ~6 weeks.   Nelva Bush, MD   CT chest high res July 2019 - visualized personally - personaly opinion - indeterminate or alt dx for UIP but agree with progression  IMPRESSION: Lungs/Pleura: No pneumothorax. No pleural effusion. No acute consolidative airspace disease or lung masses. Few scattered small solid pulmonary nodules in mid to upper right lung, largest 4 mm in the right upper lobe (series 3/image 46), unchanged since 03/14/2016 CT, considered benign. No new significant pulmonary nodules. Mild patchy air trapping in both lungs on the expiration sequence, unchanged. Patchy peribronchovascular and subpleural reticulation and  ground-glass attenuation throughout both lungs with associated mild traction bronchiectasis and mild architectural distortion. There is a slight basilar gradient to these findings. Tiny focus of honeycombing in anterior right upper lobe (series 8/image 59). These findings have mildly worsened since 03/14/2016 chest CT. 1. Spectrum of findings compatible with fibrotic interstitial lung disease with a slight basilar predominance, with mild progression since 2017. 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 2. Mild patchy air trapping, unchanged, indicative of small airways disease. 3. Left main and two-vessel coronary atherosclerosis. 4. Stable dilated main pulmonary artery, suggesting chronic pulmonary arterial hypertension.   Aortic Atherosclerosis (ICD10-I70.0).     Electronically Signed   By: Ilona Sorrel M.D.   On: 02/02/2018 14:03        OV 03/15/2019  Subjective:  [name], female , DOB: 1945-10-02 , age 66 y.o. , MRN: [id number] , AD[address]ESS: [address] [place] [id number]   03/15/2019 -   Chief Complaint  Patient presents with   IPD (Interstitial pulmonary disease)    Breathing is doing better since being on Esbriet. Has productive cough with green phelgm. Has been on antibiotic for last 6 weeks. Had hip replacement in June.     HPI [name] 76 y.o. 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  Started on Esbriet in March 2020.  Has progressive phenotype.   She presents for routine follow-up with her husband.  She tells me that ever since starting Esbriet in the spring 2020 she has been doing well.  She does not feel that the disease has progressed.  In fact she feels somewhat better.  She continues to use 4 L of oxygen.  She is now taking the Esbriet at 1 pill 3 times daily.  This is the  full dose of 1 pill.  Her last pulmonary function test was in early 2020.  It has been a while since she did her liver function test and she requires a repeat of that today.  She is willing to have her flu shot today.  She is  asking for prescription for emergency electric generator.  She does not know about the [employer] energy assistance program.  She has a handicap sticker.  She is interested in the ILD-PRO research registry study.    She is on Trelegy inhaler and Singulair I am not fully clear why   In June 2020 she had hip replacement for stress fracture and was on 6 weeks of Keflex.  She is slowly improving.  She uses a walker.  OV 05/17/2019  Subjective:  [name], female , DOB: 04/03/1946 , age 30 y.o. , MRN: [id number] , ADDRESS: [address] [place] [id number]   05/17/2019 -   Chief Complaint  Patient presents with   Follow-up    Patient reports that she has had a productive cough with yellow sputum and her PCP placed her on an antibiotic.    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  Started on Esbriet in March 2020.  Has progressive phenotype. COVID negative 04/25/2019,. On 02 4L [place] since end 2019  Also hx of asthma  Also hx of chronic sinusitis - 2017 CT maxillary sinusits - DR Wilburn Cornelia  History of sleep apnea on CPAP  HPI [name] 76 y.o. -presents to the ILD clinic for the above issues.  She continues on pirfenidone since March 2020.  She says this is helping her.  No adverse effects.  Last liver function test was August 2020.  She needs another liver function test right now.  No nausea vomiting or diarrhea or weight loss.  She continues on 4 L of oxygen continuously 24/7.  At night she uses CPAP for her sleep apnea.  She and her husband think that she needs 6 L of oxygen at night and they are trying to increase the oxygen.  Although this is not based on data but on subjective reasoning.     New issue associated with sinus issues: Her main issue today is that for the last 3-week she has had worsening of her cough and there is associated yellow sputum.  Her COVID-19 test was -2 days ago.  This been going on for 3 weeks.  She tells me this happens to her every winter.  She is complaining of chronic postnasal drainage.  In 2017 she had a CT scan of the sinus that showed maxillary sinusitis and saw Dr. Wilburn Cornelia but since then has not followed up.  She does not have a history of sinus surgery.  She is frustrated by the cough and the yellow sputum.  Her pulmonary function test itself is stable since she started the [place].  She is interested in the ILD pro registry study.  In terms of her asthma: She is on Trelegy and Singulair.  She does not want to stop this.  There is a clinical history.  She says that if she stops this it gets worse.   In terms of her sleep apnea: She continues on CPAP with oxygen at night.    ROS    IMPRESSION: CT chest Jan  2020 1. Severe tracheobronchomalacia, more pronounced on today's scan. 2. Interval progression of basilar predominant fibrotic interstitial lung disease with particularly increased ground-glass component. No honeycombing. 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 Findings are consistent with UIP per consensus guidelines: Diagnosis of Idiopathic Pulmonary Fibrosis: An Official ATS/ERS/JRS/ALAT Clinical Practice Guideline. [place], Iss 5, [id number],  Mar 18 2017. 3. Stable dilated main pulmonary artery, suggesting chronic pulmonary arterial hypertension. 4. Left main and 3 vessel coronary atherosclerosis.   Aortic Atherosclerosis (ICD10-I70.0).     Electronically Signed   By: Ilona Sorrel M.D.   On: 08/20/2018 08:21   - per HPI  OV 04/02/2020  Subjective:  [name], female , DOB: 1945/09/24 , age 28 y.o. 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  Started on Esbriet in March 2020.  Has progressive phenotype. COVID negative 04/25/2019,. On 02 4L [place] since end 2019  - on ild pro registry  Also hx of asthma -Singulair and inhaler  Also hx of chronic sinusitis - 2017 CT maxillary sinusits - DR Wilburn Cornelia  History of sleep apnea on CPAP  Rheumatoid arthritis seen by Dr. Gavin Pound on leflunomide, Plaquenil and daily prednisone 5 mg/day.  04/02/2020 -   Chief Complaint  Patient presents with   Follow-up    pt is here to go over pft results     HPI [name] 76 y.o. -presents for the above issues of interstitial lung disease in the setting of connective tissue disease.  She is on leflunomide and Plaquenil and prednisone with her rheumatologist.  She is on pirfenidone for her ILD.  I personally last saw her almost a year ago in October 2020.  After that she is seen nurse practitioner.  In the interim she says she is overall stable with the symptom score show a different story she has had significant worsening of her symptoms.  She continues to be morbidly obese.  Walking desaturation test she is desaturating to the point she is requiring 6 L to correct.  In fact early in August 2020 [place] and was extremely short of breath.  Even at rest on 3 L she was 89%.  She continues to use a CPAP.  Her husband who is here with her today would like her to lose weight in order to help her health status.  Pulmonary function test shows also a decline.  Her last CT scan of the chest was in June 2020    [place] 05/13/2020   Subjective:  [name], female , DOB: 1946/02/07, age 66 y.o. years. 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    - Started on Esbriet in March 2020.  Has progressive phenotype. COVID negative 04/25/2019,. On 02 4L [place] since end 2019  - on ild pro registry  Also hx of asthma -Singulair and inhaler  Also hx of chronic sinusitis - 2017 CT maxillary sinusits - DR Wilburn Cornelia  History of sleep apnea on CPAP  Rheumatoid arthritis seen by Dr. Gavin Pound on leflunomide, Plaquenil and daily prednisone 5 mg/day.  Elevated pulmonary artery pressures but PVR less than 3 in 2018: Right Heart Pressures RA (mean): 13 mmHg RV (S/EDP): 38/12 mmHg PA (S/D, mean): 43/23 (32) mmHg PCWP (mean): 16 mmHg  Ao sat: 94% PA sat: 70% RA sat: 75%  Fick  CO: 6.4 L/min Fick CI: 3.7 L/min/m^2  PVR: 2.5 Wood units     Chief Complaint  Patient presents with   Follow-up    more sob walking shorter distances.  coughing up yellow phlegm.       HPI [name] 76 y.o. -last visit September 2021.  At that time concern for progressive ILD.  Therefore we decided to PFTs and also high-resolution CT chest.  The high-resolution CT chest compared to last year shows stability.  The PFTs show stability within this year but progression compared to last year.  She is also lost weight following a low carbohydrate diet.  Nevertheless she feels the weight of her symptoms.  She is also using oxygen with exertion.  She feels this is getting worse.  Although her overall symptom burden seems to be improved with the weight loss.  Early morning she says there are some yellow phlegm which is stable for many many years.  This is mostly sinus.  There is no change in this.  She continues on immunosuppressants and antifibrotic.  There is no  side effects from these.  She is willing to have labs checked.  She has had a Covid vaccine including booster.  She does not know if she has antibody response yet.  She is willing to get this checked.  She is looking at options to get better.  She will participate in the ILD-pro registry visit today.  Review of the records indicate the last right eye catheterization was in 2018.  Pressures were borderline at that time.  She is willing to get this checked.  Her cardiologist is in [place].  She wants to get the procedure done in [place] and is willing to see Dr. Haroldine Laws or Dr. Aundra Dubin who are well versed with right heart catheterization locally.    Wt Readings from Last 3 Encounters:  05/13/20 202 lb 12.8 oz (92 kg)  04/02/20 207 lb 9.6 oz (94.2 kg)  02/05/20 218 lb (98.9 kg)       TECHNIQUE: CT chest sept 2021 Multidetector CT imaging of the chest was performed following the standard protocol without intravenous contrast. High resolution imaging of the lungs, as well as inspiratory and expiratory imaging, was performed.   COMPARISON:  08/16/2018, 02/02/2018, 03/14/2016, 08/27/2015   FINDINGS: Cardiovascular: Aortic atherosclerosis. Normal heart size. Three-vessel coronary artery calcifications. No pericardial effusion.   Mediastinum/Nodes: No enlarged mediastinal, hilar, or axillary lymph nodes. Thyroid gland, trachea, and esophagus demonstrate no significant findings.   Lungs/Pleura: Unchanged moderate pulmonary fibrosis in a pattern with apical to basal gradient featuring irregular peripheral interstitial opacity, septal thickening, ground-glass, and minimal tubular bronchiectasis without evidence of bronchiolectasis or honeycombing. Fibrotic findings are not significantly changed compared to immediate prior examination but significantly worsened over time on examinations dating back to 08/27/2015 occasional stable, definitively benign small pulmonary nodules, for  example a 3 mm nodule of the anterior right upper lobe (series 10, image 77). No significant air trapping on expiratory phase imaging. No pleural effusion or pneumothorax.   Upper Abdomen: No acute abnormality.   Musculoskeletal: No chest wall mass or suspicious bone lesions identified.   IMPRESSION: 1. Unchanged moderate pulmonary fibrosis in a pattern with apical to basal gradient featuring irregular peripheral interstitial opacity, septal thickening, ground-glass, and minimal tubular bronchiectasis without evidence of bronchiolectasis or honeycombing. Fibrotic findings are not significantly changed compared to immediate prior examination but significantly worsened over time on examinations dating back to occasional 08/27/2015. Findings remain consistent with a "  probable UIP" pattern by pulmonary fibrosis criteria and generally in keeping with reported history of rheumatoid arthritis and associated fibrotic interstitial lung disease. 2. Coronary artery disease.  Aortic Atherosclerosis (ICD10-I70.0).     Electronically Signed   By: Eddie Candle M.D.   On: 04/15/2020 15:35     OV 08/19/2020  Subjective:  [name], female , DOB: [birth year] , age 50 y.o. 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    - Started on Esbriet in March 2020.  Has progressive phenotype. COVID negative 04/25/2019,. On 02 4L [place] since end 2019  - on ild pro  registry  Also hx of asthma -Singulair and inhaler  Also hx of chronic sinusitis - 2017 CT maxillary sinusits - DR Wilburn Cornelia  History of sleep apnea on CPAP  Rheumatoid arthritis seen by Dr. Gavin Pound on leflunomide, Plaquenil and daily prednisone 5 mg/day.    HPI [name] 76 y.o. -presents with her husband.  Last seen in October 2021.  After that in January 2022 she was supposed to have a right heart catheterization because of concern of progressive ILD and her having autoimmune phenotype.  However middle of January 2022 she got sick with bronchitis.  She and her husband say that she gets this sick every year.  They do not know if it was Covid because she did not get tested.  She says primary care physician treated her with 10 days of 20 mg/day prednisone [at baseline she is on 5 mg/day of].  And also doxycycline for 10 days.  This is not helped.  Therefore she is now on amoxicillin and a second course of prednisone 20 mg/day for 10 days.  Her baseline dose is 5 mg prednisone.  She is not feeling better.  In fact her symptom scores are much worse.  Her pulse ox on room air at rest was 88%.  Previously room air at rest was fine.  She is frustrated by her symptoms.  She and her husband are frustrated by the symptom burden.  She is requesting Phenergan with codeine for cough control.     OV 09/03/2020  Subjective:  [name], female , DOB: [birth year] , age 26 y.o. , MRN: [id number] , ADDRESS: [address] [place] [address] PCP Lorene Dy, MD Patient Care Team: Lorene Dy, MD as PCP - General (Internal Medicine) End, Harrell Gave, MD as PCP - Cardiology (Cardiology) Gavin Pound, MD as Consulting Physician (Rheumatology)  This Provider for this visit: Treatment Team:  Attending Provider: Brand Males, MD  Type of visit: Telephone/Video Circumstance: COVID-19 national emergency Identification of patient [name] with 06-01-1946  and MRN [id number] - 2 person identifier Risks: Risks, benefits, limitations of telephone visit explained. Patient understood and verbalized agreement to proceed Anyone else on call: just patient Patient location: [phone] This provider location: [place]   09/03/2020 -  No chief complaint on file.   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    - Started on Esbriet in March 2020.  Has progressive phenotype. COVID negative 04/25/2019,. On 02 4L [place] since end 2019   - on ild pro registry  Also hx of asthma -Singulair and inhaler  Also hx of chronic sinusitis - 2017 CT maxillary sinusits - DR Wilburn Cornelia  History of sleep apnea on CPAP  Rheumatoid arthritis seen by Dr. Gavin Pound on leflunomide, Plaquenil and daily prednisone 5 mg/day.  Positive Covid IgG - Feb 2022   HPI [name] 76 y.o. -this telephone visit this telephone visit is to follow-up from recent exacerbation early February 2022.  She is finished her prednisone she is feeling back to baseline.  She says her sinuses are congested but she will follow-up with the ENT.  She is still waiting for a right heart catheterization.  She has follow-up later in March 2022 with me.  Oxygen use is baseline.  She had Covid IgG checked and this was normal.  She continues to use a CPAP.  She is regular with all her medications.  She took consent form for PULSE inhaled nitric oxide study and she is interested.  Recent lab review shows slightly high calcium.  She does not know that she has this problem    PFT   OV 10/15/2020  Subjective:  [name], female , DOB: 05-Oct-1945 , age 26 y.o. , MRN: [id number] , ADDRESS: [address] [place] [address] PCP Lorene Dy, MD Patient Care Team: Lorene Dy, MD as PCP - General (Internal Medicine) End, Harrell Gave, MD as PCP - Cardiology (Cardiology) Gavin Pound, MD as Consulting Physician  (Rheumatology)  This Provider for this visit: Treatment Team:  Attending Provider: Brand Males, MD    10/15/2020 -   Chief Complaint  Patient presents with   Follow-up    She felt she was breathing deeper before pft.      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      - Started on Esbriet in March 2020.  Has progressive phenotype. COVID negative 04/25/2019,. On 02 4L [place] since end 2019   - on ild pro registry  - last CT Sept 2021  - started PULSE iNO study march 2022 via study protocol -randomized trial  Also hx of asthma -Singulair and inhaler trelegy  Also hx of chronic sinusitis - 2017 CT maxillary sinusits - DR Wilburn Cornelia  History of sleep apnea on CPAP  Rheumatoid arthritis seen by Dr. Gavin Pound on leflunomide, Plaquenil and daily prednisone 5 mg/day.  Positive Covid IgG - Feb 2022   HPI [name] 76 y.o. -presents for standard of care visit.  She is now on inhaled nitric oxide study.  The inhaled nitric oxide is being bled through her oxygen.  She has to wear 12 hours a day.  She was randomized 2 days ago.  She tells me for the last 4 days she is having sinus complaints of yellow sinus drainage and increased cough.  She feels as acute sinusitis and she will benefit from antibiotics and prednisone.  Specifically she did not mention this at the time of randomization.  She denied this at the time of randomization but now she says it was present for 4 days.  She had pulmonary function test that shows continued decline in ILD.  Her oxygen use remains the same at 4 L and with exertion she uses 5 L.  She  is on Trelegy for asthma.  Her CT scan of the chest in September did not show any evidence of emphysema.  She uses CPAP for her sleep apnea.  Labs from research were reviewed by me.  Her most recent hemoglobin is 12.  Creatinine and liver function test was normal.  Calcium is not available.   Of note after starting  the inhaled nitric oxide she feels she can breathe more deeper.  However his symptom score shows continued decline.    OV 04/27/2021  Subjective:  [name], female , DOB: [birth year] , age 1 y.o. , MRN: [id number] , ADDRESS: [address] [place] [id number] PCP Lorene Dy, MD Patient Care Team: Lorene Dy, MD as PCP - General (Internal Medicine) End, Harrell Gave, MD as PCP - Cardiology (Cardiology) Gavin Pound, MD as Consulting Physician (Rheumatology)  This Provider for this visit: Treatment Team:  Attending Provider: Brand Males, MD    04/27/2021 -   Chief Complaint  Patient presents with   Follow-up    Pt states she has been sick for about 6 weeks. Has been on prednisone as well as abx and is still not over it. States she is coughing getting up yellow-green phlegm and also has had some wheezing. Also has had increased SOB.      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    - Started on Esbriet in March 2020.  Has progressive phenotype. COVID negative 04/25/2019,. On 02 4L [place] since end 2019   - on ild pro registry  - started PULSE iNO study march 2022 via study protocol -randomized trial -> randomized to Open Label in sept 2022  - [place] 11/09/20   -   RA = 12 RV = 52/14 PA = 55/25 (38) PCW = 19 Fick cardiac output/index = 5.8/3.1 Thermo CO/CI = 7.4/4.0 PVR = 3.3 WU (Fick) 2.6 WU (thermo) FA sat = 99% PA sat = 71%, 71% SVC sat = 76% Given relatively normal PVR may not benefit much from Tyvas - per cads    Also hx of asthma -Singulair and inhaler trelegy  - 0% eoios last checked 2019  Also hx of chronic sinusitis  - 2017 CT maxillary sinusits - DR Wilburn Cornelia  - CT sinus nov 2020 - celar  History of sleep apnea on CPAP  Rheumatoid arthritis seen by Dr. Gavin Pound on leflunomide, Plaquenil and daily prednisone 5 mg/day.  Positive Covid IgG - Feb  2022   HPI KIAH VANALSTINE 76 y.o. -presents for standard of care visit to the ILD center with [name].  She presents with her husband.  She tells me that since summer 2022 she is having increased sinus drainage.  For the last 6 weeks she is got more cough.  She feels there is a frog in the throat.  She brings yellow drainage from the sinuses.  The drainage increases with Nettie pot saline wash.  Sometimes she is clearing this from the throat.  Her husband says this happens every winter.  2 years ago we did CT scan of the sinus and it was clear.  She has been on 2 antibiotics.  She felt cephalexin and azithromycin did not help.  She prefers to have doxycycline.  She has been on 1 prednisone 10-day taper.  She feels that she wants another course of doxycycline and prednisone.  She also states that if she takes antibiotics she will want Diflucan.  Her last sinus CT  was 2 years ago and her last CT chest was 1 year ago.  She also reports cough and chest tightness.  Worsening shortness of breath.  Despite subjective reporting of worsening shortness of breath subjective objective dyspnea score on the scoring scale looks the same  She continues on antifibrotic pirfenidone.  She is tolerating this well.  She is also on inhaled nitric oxide open label with the oxygen.  Her pulmonary function test shows continued decline.  Because of her symptoms we did a nitric oxide exhaled test and this was high.  Unclear if this because of inhaled nitric oxide.  Research of the study sponsor on this.    Lab Results  Component Value Date   NITRICOXIDE 139 04/27/2021   Xxxxxxxxxxxxxxxxxxxxxx  05/24/2021 -  Followup Test results - video visit   HPI [name] 76 y.o. - standard of care visit to discuss CT  and lab results  Per research team:  She rated her shortness of breath today at a 5 out of 5 today and has been since July 18th, before it was 4 out of 5. Her Borg Dyspnea score was 3 (moderate) at rest the  last two visits and before that only rated it 1 (slight) back in June. On 6wmd: he walked 153 meters today compared to 120 meters on 03/29/2021. SHe is currently on Open Labvel extensio of the inhaled NO Study-she was able to walk just since 6 L.  She is now on scheduled to see research team every 4 months.   71md  - 167m start of study - 167 m I June 2022 - 120 m sept 2022 -> open labe inhaled NO rolled over July 2022 - 1557m1/7/22 - needed 6L . -> improved  Labs  - ANCA - normal - IgE and RAST allergy panel negative - PFT shows continued decline    Overall it appears based on pulmonary function test and CT scan of the chest she is declined significantly.  She also has severe tracheobronchomalacia which has not been documented in the prior CT scan.  I did discuss the progression with her.  She indicated to me that she was worried about her progression.  She appears to be on optimal antifibrotic therapy with pirfenidone.  We reviewed her rheumatoid arthritis medication she no longer on Plaquenil.  Recent documented above.  Subsequently did discuss with Dr. AnGavin Pound We considered possibility of using Actemra which in scleroderma ILD is shown lung stabilization/modulation effect.  She will try to make a switch for this if insurance would approve.  Did indicate to patient that if she continued to decline then we would involve palliative care at home.  At this point in time we resolved that she will continue with the research protocol. The tracheobronchomalacia is the likely reason for exertional wheezing.      OV 09/16/2021  Subjective:  [name] , DOB: 9/May 29, 1946 age 76.o. 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    - Started on Esbriet in March 2020.  Has progressive phenotype. COVID negative 04/25/2019,.   - on ild pro registry  - started PULSE iNO study march 2022 via study protocol -randomized trial -> randomized to Open Label in sept 2022 -> stopped 08/24/21 due to sinus congestion and inconvieniece  - [facility] 11/09/20   -   RA = 12 RV = 52/14 PA = 55/25 (38) PCW = 19 Fick cardiac output/index = 5.8/3.1 Thermo CO/CI = 7.4/4.0 PVR = 3.3 WU (Fick) 2.6 WU (thermo) FA sat = 99% PA sat = 71%, 71% SVC sat = 76% Given relatively normal PVR may not benefit much from Tyvaso - per cads    Also hx of chronic sinusitis  - 2017 CT maxillary sinusits - DR SWilburn Cornelia - CT sinus nov 2020 and October 2022- celar  - normal ANCA panel Oct 2022  History of sleep apnea on CPAP  Rheumatoid arthritis seen by Dr. AGavin Poundon leflunomide, Plaquenil and daily prednisone 5 mg/day.  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 States she does have an occasional cough.     HPI Hailey KIMERY89y.o. -presents with her husband.  On 08/24/2021 she  completely withdrew from the inhaled nitric oxide open label extension part of the study.  The coordinator did mention that to me back then.  She is simply not interested in the study.  In fact she refused even participate in the follow-up visits without the investigational medical product.  She just wants to be done with it.  She was frustrated with the device in the machine and the noise.  In addition she felt that the inhaled nitric oxide was making the sinus situation worse.  She believes that after going to open label extension she got worse.  Since stopping the inhaler anticoagulate her sinus complaints are better.  She feels also is that she is less short of breath.  Of note she has now had 3 doses once monthly of Actemra for rheumatoid arthritis.  Correlating with this the husband notices that she is using her cane less and she is in less pain and she is moving better. For chronic respiratory failure she uses 6 L nasal cannula continuous at rest and with exertion.  Overall she feels better.  Symptom score is below.  She still continues to participate in the ILD-Pro registry and today she has a visit for that.    SYMPTOM SCALE - ILD 03/15/2019  05/17/2019  04/02/2020  05/13/2020   08/19/2020 covid 10/15/2020  04/27/2021 210# 09/16/2021   O2 use x      5L [place] - ino study 6L [place] . Off iNO study  Shortness of Breath 0 -> 5 scale with 5 being worst (score  6 If unable to do)         At rest 0 0 4 0 _0 0  Simple tasks - showers, clothes change, eating, shaving 0 0 _1 Household (dishes, doing bed, laundry) _2 Shopping _3 Walking level at own pace _4 Walking up Stairs _5 Total (40 - 48) Dyspnea Score _6 How bad is your cough? _7 0 _8 How bad is your fatigue _9 nauea   0 0 0 0 0 0  vomit   0 0 0 0 0 0  diarrhea   _10 anxiety   _11 depression   _12 Simple office walk 185 feet x  3 laps goal with forehead probe 03/15/2019 - uses 4L [place] at home  04/02/2020  05/13/2020 92% on RA, 4L [place] when walking in 91% 08/19/2020 Uses 4L [place] at rest. Was 88% o RA at rest 09/16/2021 6LNC contnous  O2 used Room air Walked on 3L [place]     Number laps completed 1/2 lap ad desat to 88%      Comments about pace       Resting Pulse Ox/HR  97% and 75     Final Pulse Ox/HR  88% and 108 at 2nd laps     Desaturated </= 88%       Desaturated <= 3% points       Got Tachycardic >/= 90/min       Symptoms at end of test       Miscellaneous comments -walked with 4L [place] - uses 4L [place] at home Needed 6L [place] to correct      Results for CUBA, NATARAJAN (MRN [id number]) as of 04/27/2021 11:38  Ref. Range 09/03/2015 12:45 10/01/2018 14:45 05/17/2019 08:59 10/02/2019 10:50 04/01/2020 11:24 09/11/2020 14:06 covid 10/15/2020 09:59 12/08/2020 09:03 02/02/2021 08:12 researc 03/29/2021 14:07  research 05/24/21 research  FVC-Pre Latest Units: L 2.18 1.81 1.82 1.62 1.60 1.58 1.47 1.45 2.00 1.54 1.44L  FVC-%Pred-Pre Latest Units: % _13 60%  Results for CHIKA, CICHOWSKI (MRN [id number]) as of 04/27/2021 11:38  Ref. Range 09/03/2015 12:45 10/01/2018 14:45 05/17/2019 08:59 10/02/2019 10:50 04/01/2020 11:24 09/11/2020 14:06 10/15/2020 09:59 12/08/2020 09:03 02/02/2021 08:12 research 03/29/2021 14:07  research 05/24/76 research  DLCO unc Latest Units: ml/min/mmHg 14.82 11.94 11.28 10.42 11.37 7.59 9.05 9.05 9.22 6.95 8.17  DLCO unc % pred Latest Units: % _14 45%    HRCT Oct 2022   Narrative & Impression  CLINICAL DATA:  76 year old female with history of interstitial lung disease. Worsening coughing.   EXAM: CT CHEST WITHOUT CONTRAST   TECHNIQUE: Multidetector CT imaging of the chest was performed following the standard protocol without intravenous contrast. High resolution imaging of the lungs, as well as inspiratory and expiratory imaging, was  performed.   COMPARISON:  Chest CT 04/15/2020.   FINDINGS: Cardiovascular: Heart size is normal. There is no significant pericardial fluid, thickening or pericardial calcification. There is aortic  atherosclerosis, as well as atherosclerosis of the great vessels of the mediastinum and the coronary arteries, including calcified atherosclerotic plaque in the left main, left anterior descending, left circumflex and right coronary arteries. Calcifications of the mitral annulus.   Mediastinum/Nodes: No pathologically enlarged mediastinal or hilar lymph nodes. Esophagus is unremarkable in appearance. No axillary lymphadenopathy.   Lungs/Pleura: High-resolution images demonstrate widespread but patchy areas of ground-glass attenuation, septal thickening, scattered subpleural reticulation, thickening of the peribronchovascular interstitium, some scattered regions of mild cylindrical bronchiectasis and peripheral bronchiolectasis, but no frank honeycombing. Findings have no discernible craniocaudal gradient. Findings are progressive compared to the prior examination. Inspiratory and expiratory imaging demonstrates extensive air trapping indicative of small airways disease. As well, there is severe collapse of the trachea and mainstem bronchi during expiration indicative of tracheobronchomalacia. No acute consolidative airspace disease. No pleural effusions. 4 mm right middle lobe pulmonary nodule (axial image 70 of series 5), previously only 2 mm. 4 mm subpleural nodule in the periphery of the right upper lobe (axial image 46 of series 5), stable compared to the prior examination. No other larger more suspicious appearing pulmonary nodules or masses are noted.   Upper Abdomen: Subcentimeter low-attenuation lesion in segment 7 of the liver, incompletely characterized on today's non-contrast CT examination, but statistically likely to represent a small cyst.   Musculoskeletal: There are no  aggressive appearing lytic or blastic lesions noted in the visualized portions of the skeleton.   IMPRESSION: 1. The appearance of the lungs is compatible with progressive interstitial lung disease, with a spectrum of findings considered most compatible with an alternative diagnosis (not usual interstitial pneumonia) per current ATS guidelines. Specifically, findings are strongly favored to represent chronic hypersensitivity pneumonitis. 2. Slight interval enlargement of a 4 mm right middle lobe pulmonary nodule. This is nonspecific, but attention on repeat high-resolution chest CT is recommended in 12 months to ensure stability or regression of this finding. 3. Severe tracheobronchomalacia. 4. Aortic atherosclerosis, in addition to left main and 3 vessel coronary artery disease. Assessment for potential risk factor modification, dietary therapy or pharmacologic therapy may be warranted, if clinically indicated. 5. There are calcifications of the mitral annulus. Echocardiographic correlation for evaluation of potential valvular dysfunction may be warranted if clinically indicated.   Aortic Atherosclerosis (ICD10-I70.0).     Electronically Signed   By: Vinnie Langton M.D.   On: 05/16/2021 07:58   PFT  PFT Results Latest Ref Rng & Units 05/24/2021 03/29/2021 02/02/2021 12/08/2020 10/15/2020 09/11/2020 04/01/2020  FVC-Pre L 1.44 1.54 2.00 1.45 1.47 1.58 1.60  FVC-Predicted Pre % 60 63 82 63 63 65 65  FVC-Post L - - - - - - -  FVC-Predicted Post % - - - - - - -  Pre FEV1/FVC % % 74 79 63 78 83 80 84  Post FEV1/FCV % % - - - - - - -  FEV1-Pre L 1.06 1.21 1.26 1.14 1.21 1.26 1.35  FEV1-Predicted Pre % 60 67 69 66 70 70 73  FEV1-Post L - - - - - - -  DLCO uncorrected ml/min/mmHg 8.17 6.95 9.22 9.05 9.05 7.59 11.37  DLCO UNC% % 47 40 53 54 54 44 66  DLCO corrected ml/min/mmHg - - - - 9.05 - 11.37  DLCO COR %Predicted % - - - - 54 - 66  DLVA Predicted % 76 72 85 96 97 85 101  TLC L -  - - - - - -  TLC % Predicted % - - - - - - -  RV % Predicted % - - - - - - -       has a past medical history of Acute asthmatic bronchitis, Allergic rhinitis, Anxiety, Arthritis, Esophageal reflux, Hypertension, Interstitial lung disease ([place]) (dx jan 2020), Irritable bowel syndrome, Oxygen dependent, PONV (postoperative nausea and vomiting), Rheumatoid arthritis(714.0), and Sleep apnea.   reports that she has never smoked. She has never used smokeless tobacco.  Past Surgical History:  Procedure Laterality Date    c secttion  1977   2 foot fusions Left    total of 6 left foot sx   ABDOMINAL HYSTERECTOMY     complete    ANKLE FUSION  2009   left   BACK SURGERY     lower l to l 5 fused   CHOLECYSTECTOMY     RIGHT HEART CATH N/A 11/09/2020   Procedure: RIGHT HEART CATH;  Surgeon: Jolaine Artist, MD;  Location: [facility];  Service: Cardiovascular;  Laterality: N/A;   RIGHT/LEFT HEART CATH AND CORONARY ANGIOGRAPHY N/A 09/16/2016   Procedure: Right/Left Heart Cath and Coronary Angiography;  Surgeon: Nelva Bush, MD;  Location: [facility];  Service: Cardiovascular;  Laterality: N/A;   TONSILLECTOMY     TOTAL HIP ARTHROPLASTY Right 12/20/2018   Procedure: TOTAL HIP ARTHROPLASTY ANTERIOR APPROACH;  Surgeon: Paralee Cancel, MD;  Location: [facility];  Service: Orthopedics;  Laterality: Right;  70 mins   TOTAL KNEE ARTHROPLASTY Bilateral     Allergies  Allergen Reactions   Cefdinir Diarrhea   Erythromycin Base Diarrhea   Lactose Dermatitis   Lactulose Diarrhea   Mesalamine Nausea Only   Methotrexate Other (See Comments)    Felt sick   Nitrofuran Derivatives     shakiness   Other Diarrhea and Other (See Comments)    Shaking uncontrollably "lettuce only" "lettuce only"   Sulfa Antibiotics Other (See Comments)    Patient can't recall reaction    Tdap [Tetanus-Diphth-Acell Pertussis]     Shaking uncontrollably   Tetanus Toxoid, Adsorbed Other (See Comments)     Shaking uncontrollable     Immunization History  Administered Date(s) Administered   Fluad Quad(high Dose 65+) 03/15/2019   Influenza Split 03/18/2012, 03/18/2013, 04/14/2014, 04/17/2017   Influenza Whole 04/30/2008, 04/20/2009, 04/18/2011   Influenza, High Dose Seasonal PF 04/23/2015, 03/24/2016, 04/06/2018, 04/02/2020, 03/24/2021   Influenza,inj,quad, With Preservative 04/06/2018   PFIZER(Purple Top)SARS-COV-2 Vaccination 08/24/2019, 09/18/2019, 04/13/2020   Pneumococcal Conjugate-13 11/11/2013   Pneumococcal Polysaccharide-23 05/21/2018   Zoster Recombinat (Shingrix) 05/05/2017    Family History  Problem Relation Age of Onset   Heart disease Mother    Arthritis Mother    Heart attack Father    Diabetes Other        sibling   Heart attack Other        sibling     Current Outpatient Medications:    albuterol (VENTOLIN HFA) 108 (90 Base) MCG/ACT inhaler, USE 2 INHALATIONS BY MOUTH  EVERY 6 HOURS AS NEEDED, Disp: 34 g, Rfl: 1   aspirin EC 81 MG tablet, Take 81 mg by mouth daily., Disp: , Rfl:    benzonatate (TESSALON) 200 MG capsule, Take 1 capsule (200 mg total) by mouth 3 (three) times daily as needed for cough., Disp: 90 capsule, Rfl: 2   celecoxib (CELEBREX) 200 MG capsule, Take 200 mg by mouth daily., Disp: , Rfl:    cetirizine (ZYRTEC) 10 MG tablet, Take 20 mg by mouth daily., Disp: , Rfl:    Cholecalciferol (VITAMIN D3)  250 MCG (10000 UT) capsule, Take 10,000 Units by mouth 4 (four) times a week., Disp: , Rfl:    clidinium-chlordiazePOXIDE (LIBRAX) 5-2.5 MG capsule, Take 1 capsule by mouth 3 (three) times daily as needed (for IBS)., Disp: , Rfl:    colestipol (COLESTID) 1 g tablet, Take 2 g by mouth daily., Disp: , Rfl:    dicyclomine (BENTYL) 20 MG tablet, Take 20 mg by mouth 4 (four) times daily as needed for spasms., Disp: , Rfl:    FLUoxetine (PROZAC) 20 MG capsule, Take 20 mg by mouth daily., Disp: , Rfl:    fluticasone (FLONASE) 50 MCG/ACT nasal spray, SPRAY 2  SPRAYS INTO EACH NOSTRIL EVERY DAY, Disp: 48 mL, Rfl: 3   Fluticasone-Umeclidin-Vilant (TRELEGY ELLIPTA) 100-62.5-25 MCG/ACT AEPB, Inhale 1 puff into the lungs daily., Disp: 60 each, Rfl: 5   furosemide (LASIX) 40 MG tablet, TAKE 1 TABLET BY MOUTH EVERY DAY, Disp: 30 tablet, Rfl: 7   ipratropium (ATROVENT) 0.03 % nasal spray, Use 1-2 sprays in both nostrils twice daily., Disp: 90 mL, Rfl: 3   ipratropium-albuterol (DUONEB) 0.5-2.5 (3) MG/3ML SOLN, USE 1 VIAL IN NEBULIZER EVERY 6 HOURS AS NEEDED J45.901, Disp: 270 mL, Rfl: 11   KLOR-CON M20 20 MEQ tablet, TAKE 1 TABLET BY MOUTH TWICE A DAY, Disp: 180 tablet, Rfl: 3   leflunomide (ARAVA) 20 MG tablet, Take 20 mg by mouth daily. , Disp: , Rfl:    methocarbamol (ROBAXIN) 750 MG tablet, Take 750 mg by mouth every 6 (six) hours as needed for muscle spasms., Disp: , Rfl:    montelukast (SINGULAIR) 10 MG tablet, Take 1 tablet (10 mg total) by mouth at bedtime., Disp: 90 tablet, Rfl: 3   omeprazole (PRILOSEC) 40 MG capsule, Take 40 mg by mouth at bedtime. , Disp: , Rfl:    OXYGEN, Inhale 4 mLs into the lungs continuous. , Disp: , Rfl:    Pirfenidone (ESBRIET) 801 MG TABS, Take 801 mg by mouth 3 (three) times daily., Disp: 270 tablet, Rfl: 3   predniSONE (DELTASONE) 5 MG tablet, Take 5 mg by mouth daily with breakfast., Disp: , Rfl:    rosuvastatin (CRESTOR) 20 MG tablet, Take 1 tablet (20 mg total) by mouth daily. PLEASE SCHEDULE OFFICE VISIT FOR FURTHER REFILLS. THANK YOU!, Disp: 90 tablet, Rfl: 0   Tocilizumab (ACTEMRA IV), Inject into the vein every 30 (thirty) days. infusion, Disp: , Rfl:    traMADol (ULTRAM) 50 MG tablet, Take 50 mg by mouth 3 (three) times daily as needed for moderate pain., Disp: , Rfl:    traZODone (DESYREL) 50 MG tablet, Take 50 mg by mouth at bedtime., Disp: , Rfl:    verapamil (CALAN-SR) 240 MG CR tablet, TAKE 1 TABLET BY MOUTH  DAILY, Disp: 90 tablet, Rfl: 3      Objective:   Vitals:   09/16/21 0947  BP: 124/70  Pulse:  85  Temp: 98.1 F (36.7 C)  TempSrc: Oral  SpO2: 97%  Weight: 198 lb 9.6 oz (90.1 kg)  Height: _0  (1.549 m)    Estimated body mass index is 37.53 kg/m as calculated from the following:   Height as of this encounter: _1  (1.549 m).   Weight as of this encounter: 198 lb 9.6 oz (90.1 kg).  _2 @  [facility] Weights   09/16/21 0947  Weight: 198 lb 9.6 oz (90.1 kg)     Physical Exam  General: No distress. obese Neuro: Alert and Oriented x 3. GCS 15. Speech normal  Psych: Pleasant Resp:  Barrel Chest - no.  Wheeze - no, Crackles - no, No overt respiratory distress CVS: Normal heart sounds. Murmurs - no Ext: Stigmata of Connective Tissue Disease - no HEENT: Normal upper airway. PEERL +. No post nasal drip.  Has rheumatoid arthritis deformities Is using a cane.        Assessment:       ICD-10-CM   1. Chronic respiratory failure with hypoxia (HCC)  J96.11 Pulmonary function test    Hepatic function panel    2. ILD (interstitial lung disease) ([place])  J84.9 Pulmonary function test    Hepatic function panel    3. Therapeutic drug monitoring  Z51.81 Pulmonary function test    Hepatic function panel    4. High risk medication use  Z79.899      Her conditions and medications require intensive monitoring    Plan:     Patient Instructions  Chronic respiratory failure with hypoxia (HCC) Interstitial lung disease due to connective tissue disease ([place]) High risk medication use Therapeutic drug monitoring REsearch subject - Pulse iNO study - randomized 10/13/20 and rolled over open label 03/29/21 -.withdrew 08/24/21  -Progressive interstitial lung disease - clinically stable since visit Nov 2022.   Now needing 6L Dasher rest for 74md - Plan ---Continue using oxygen 6 L at rest and 5- 8 L with exertion  -Monitor pulse ox with exertion and keep it over 86% -Continue participation in ILD-pro research registry -Continue pirfenidone   - check LFT 09/16/2021 =Respect  withdrawal from iNO pulsed device study 08/24/21 - Continue RA Rx with Dr. AGavin Pound -  Actemra, leflunomade, prednisone, flonase, celebrex - do spirometry and dlco in 2-3 months  Obstructive sleep apnea  Plan -Continue CPAP  Chrnonic persistnet sinusitis    - In control   Plan - continue regular flonase and netti pot and atrovent nasal spray snf cetrizine -  Asthma/tracheobronchomalacia -Not in flareup.    Plan  - continue trelegy scheduled  - use alb as needed   Followup - 2-3 month s ace to face -30-minute visit Dr. RChase Callerbut after spirometry and dlco  - symptoms socre and pulse ox on room air at rest and walk test on 5-6L  at followup    SIGNATURE    Dr. MBrand Males M.D., F.C.C.P,  Pulmonary and Critical Care Medicine Staff Physician, CLeisure CityDirector - Interstitial Lung Disease  Program  [facility] [place] [id number] Pager: [phone] If no answer or between  15:00h - 7:00h: call [phone] Telephone: [phone]  10:17 AM 09/16/2021

## 2021-09-17 ENCOUNTER — Telehealth: Payer: Self-pay | Admitting: *Deleted

## 2021-09-17 NOTE — Telephone Encounter (Signed)
Called and spoke to pt. Notified of lab results and Dr. Darnelle Bos recc.  ?Pt voiced understanding and has no further questions at this time.  ?

## 2021-09-17 NOTE — Telephone Encounter (Signed)
-----  Message from Nelva Bush, MD sent at 09/17/2021  8:27 AM EST ----- ?Regarding: RE: Labs 06/09/21 ?Thank you for the update.  I have reviewed most recent labs.  LDL is well controlled.  Triglycerides are slightly elevated.  I recommend that [name] continue her current medications and follow-up as discussed at our recent office visit. ? ?Nelva Bush, MD ?[facility] ? ? ?----- Message ----- ?From: Solmon Ice, RN ?Sent: 09/14/2021  12:13 PM EST ?To: Nelva Bush, MD ?Subject: Labs 06/09/21                                 ? ?Dr. Saunders Revel, ? ?Most recent labs from pt's PCP have been scanned in pt's chart under media tab. Labs collected 06/09/21.  ? ?Thank you, ? ?Braylen Staller  ? ? ?

## 2021-10-05 ENCOUNTER — Telehealth: Payer: Self-pay

## 2021-10-05 ENCOUNTER — Other Ambulatory Visit (HOSPITAL_COMMUNITY): Payer: Self-pay

## 2021-10-05 NOTE — Telephone Encounter (Signed)
Received notification that pt's consent form as well as prescriber form were both expiring and would need to be resubmitted. Updated pt consent form has been obtained and submitted to [place]. Will place provider portion in [place] for signature. ?

## 2021-10-08 NOTE — Telephone Encounter (Signed)
PI OVERSIGHT ATTESTATION ? ?I the Principal Investigator (PI) for the above mentioned study attest that I reviewed the mentioned  clinical research coordinator  notes on research subject  [name]  born 1946-04-03 . I  agree with the findings mentioned below by research team ? ?This is a late note but I was aware of the withdrawal contemporaneously and happened under my oversight . Also discussed with subject at subsequent [facility] visit in clinic ? ? ?Dr.Abdou Stocks [name], MD ?Pulmonary and Critical Care Medicine ?Research Investigator & Staff Physician ?[facility] ?[facility] [place] [facility] ? ?[place] [facility] ?Pager: [phone], If no answer or between  15:00h - 7:00h: call [phone] ? ?10/08/2021 ?8:39 AM ? ? ? ? ? ?

## 2021-10-12 NOTE — Telephone Encounter (Signed)
First form never returned to pharmacy, filled out and placed second form in MR box for signature. ?

## 2021-10-13 NOTE — Telephone Encounter (Signed)
MR is out of the office until 4/4. Will give form to him to sign when he returns. ?

## 2021-10-14 ENCOUNTER — Telehealth: Payer: Self-pay | Admitting: Internal Medicine

## 2021-10-14 NOTE — Telephone Encounter (Addendum)
Form has been in MR's box awaiting signature. He has been out of office and will not return until 4/4. Be advised that updates can be found in the 10/05/21 encounter which is currently still in-progress. ?

## 2021-10-21 ENCOUNTER — Other Ambulatory Visit (HOSPITAL_COMMUNITY): Payer: Self-pay

## 2021-10-21 NOTE — Telephone Encounter (Signed)
Provider portion received and faxed back to [place]. Nothing further needed at this time. ?

## 2021-10-25 ENCOUNTER — Other Ambulatory Visit: Payer: Self-pay | Admitting: Internal Medicine

## 2021-11-16 ENCOUNTER — Other Ambulatory Visit: Payer: Self-pay | Admitting: Internal Medicine

## 2021-11-16 DIAGNOSIS — I2510 Atherosclerotic heart disease of native coronary artery without angina pectoris: Secondary | ICD-10-CM

## 2021-12-01 ENCOUNTER — Other Ambulatory Visit: Payer: Self-pay | Admitting: Internal Medicine

## 2021-12-08 ENCOUNTER — Ambulatory Visit (INDEPENDENT_AMBULATORY_CARE_PROVIDER_SITE_OTHER): Payer: Medicare Other | Admitting: Internal Medicine

## 2021-12-08 ENCOUNTER — Telehealth: Payer: Self-pay | Admitting: Internal Medicine

## 2021-12-08 ENCOUNTER — Encounter: Payer: Self-pay | Admitting: Internal Medicine

## 2021-12-08 VITALS — BP 136/78 | HR 76 | Ht 61.0 in | Wt 204.2 lb

## 2021-12-08 DIAGNOSIS — R0602 Shortness of breath: Secondary | ICD-10-CM | POA: Diagnosis not present

## 2021-12-08 DIAGNOSIS — J9611 Chronic respiratory failure with hypoxia: Secondary | ICD-10-CM

## 2021-12-08 DIAGNOSIS — J849 Interstitial pulmonary disease, unspecified: Secondary | ICD-10-CM

## 2021-12-08 DIAGNOSIS — I2510 Atherosclerotic heart disease of native coronary artery without angina pectoris: Secondary | ICD-10-CM

## 2021-12-08 DIAGNOSIS — Z5181 Encounter for therapeutic drug level monitoring: Secondary | ICD-10-CM

## 2021-12-08 LAB — PULMONARY FUNCTION TEST
DL/VA % pred: 74 %
DL/VA: 3.13 ml/min/mmHg/L
DLCO cor % pred: 41 %
DLCO cor: 7.13 ml/min/mmHg
DLCO unc % pred: 41 %
DLCO unc: 7.13 ml/min/mmHg
FEF 25-75 Pre: 1.07 L/sec
FEF2575-%Pred-Pre: 76 %
FEV1-%Pred-Pre: 62 %
FEV1-Pre: 1.1 L
FEV1FVC-%Pred-Pre: 107 %
FEV6-%Pred-Pre: 61 %
FEV6-Pre: 1.38 L
FEV6FVC-%Pred-Pre: 105 %
FVC-%Pred-Pre: 58 %
FVC-Pre: 1.38 L
Pre FEV1/FVC ratio: 80 %
Pre FEV6/FVC Ratio: 100 %

## 2021-12-08 NOTE — Progress Notes (Signed)
05/21/2018- 76 year old female never smoker followed for allergic rhinitis, rhinosinusitis, asthmatic bronchitis, ILD, OSA,complicated by GERD/Crohn's, Rheumatoid arthritis/prednisone -----4 month follow up for asthma and OSA. Per patient she is still having issues with SOB. She has had 2 foot surgeries since her last visit. States that the [name] is working but [name] wanted to switch her to something else. Unsure of the medication name and I did not see it in his last OV.  Singulair, and Anoro, neb duoneb CPAP auto 5-15/Advanced   Download 100% compliance AHI 1.3/hour. I discussed FENO and she will try Symbicort. Discussed inhaled steroids.  We need to get her a new AeroChamber. Has been on sustained antibiotics for failed appliance in her foot. Notices some runny nose and scant bland mucus.  Wheezing has been well controlled.  Little cough.  She and her husband recognize inability to get any exercise as a substantial reason for dyspnea.  No acute events.  Cardiology continues to follow, including her pulmonary hypertension.  We reviewed her most recent chest CT and discussed implications of progressive interstitial fibrosis.  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  I would like to refer her to [name] for his opinion and advice. FENO 03/15/18  49 H CT chest Hi Res 02/02/2018- IMPRESSION: 1. Spectrum of findings compatible with fibrotic interstitial lung disease with a slight basilar predominance, with mild progression since 2017. 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 2. Mild patchy air trapping, unchanged, indicative of small airways disease. 3. Left main and two-vessel coronary atherosclerosis. 4. Stable dilated main pulmonary artery, suggesting chronic pulmonary arterial hypertension. Aortic Atherosclerosis (ICD10-I70.0).  06/08/18-  76 year old female never smoker followed for allergic rhinitis, rhinosinusitis, asthmatic bronchitis, ILD, OSA,complicated by GERD/Crohn's, Rheumatoid arthritis/prednisone -----coughing for 2 weeks, yellow, green mucus, SOB  Singulair, Atrovent nasal spray, Symbicort 160, and Anoro, neb albuterol, neb DuoNeb, Complains of increased cough with chest congestion, green/yellow sputum, low-grade fever 99 degrees over the last 2 weeks.  Not on prednisone now.  Last antibiotic in October. Pending ILD consultation with [name] in December.   07/02/2018  - Visit   76 year old female patient presenting today for acute visit.  Patient was recently treated with a course of Augmentin and reports sputum color is now light green but has decreased in sputum amount since last office visit.  Patient does not feel she is getting better.  Patient remains adherent to Anoro Ellipta.  On arrival to our office today patient was short of breath and oxygen saturations were 81%.  With deep breathing patient was able to increase oxygenation to 85%.  When placed on 2 L O2 patient was able to maintain at 91%.  Patient had previously been on oxygen in 2015 but this was later [facility].  Patient's weight has also increased over the previous office visits, patient remains adherent to diuretic treatment plan as on taking 1 diuretic daily.  Patient reports she is allowed to take an additional diuretic when she feels necessary.  Unfortunately patient has not been weighing herself regularly there is no set plan to when she should take her second diuretic.  Patient is exceptionally sedentary at home and rarely gets out of her bed or lazy boy chair which is about 3 feet from her bed.  Patient's husband takes care of patient completely including getting drinks and food so patient does not have to leave her lazy  boy recliner.  Patient watches many television shows and does not report much activity.   Patient has completed follow-up with  rheumatology last week and no changes to her medications were made.  Patient reports that she was stable at the time.  CPAP compliance report showing 30 out of 30 days used.  All 30 those days greater than 4 hours.  APAP settings 5-15.  Average usage days 7 hours and 56 minutes.  AHI 0.8.    OV 07/31/2018 -referred to interstitial lung disease clinic  Subjective:  [name], female , DOB: [name] , age 59 y.o. , MRN: [id number] , ADDRESS: [name]   07/31/2018 -   Chief Complaint  Patient presents with   Follow-up    Pt of [name] that stated to be referred to ILD clinic.  Pt currently has complaints of wheezing which she has had since December 2019, worsening SOB when moving around and also states she has a pain in left side of chest near shoulder. Pt wears between 2-4L O2 but mainly has been wearing 4L at home.      HPI [name] 76 y.o. -accompanied by her husband.  Referred by [name] pulmonologist in our practice for evaluation of interstitial lung disease in the setting of rheumatoid arthritis.  History is gained from her, talking to her husband, review of recent office visits and also the interstitial lung disease questionnaire.  As best as I can gather   [name] ILD Questionnaire  Symptoms: She is known to have ILD on a CT chest in 2017 but she is not aware of it.  She says that she follows normally for sleep apnea and asthma with [name].  She recollects that approximately 11 or 12 years ago she was hospitalized for pneumonia and was on oxygen for short while not otherwise specified.  Then came off oxygen.  After that overall she is been stable and sedentary using her CPAP.  She is mostly sedentary because of obesity, rheumatoid arthritis and also because of the nonhealing ulcer in her feet for which she is been advised sedentary lifestyle to enable healing.  Most recently to  Thanksgiving 2019 she was seen for respiratory exacerbation and given antibiotics and prednisone.  She followed up mid December 2019 and was found to have new onset hypoxemia 85% on room air.  She was then discharged on portable oxygen which she is using 4 L nasal cannula at rest.  She tells me that at home when she comes off of CPAP she is in her 85s on room air at rest.  In fact in the office today she is 83% on room air at rest but 93% on 2 L nasal cannula at rest.  There is concerned that she has worsening ILD particularly summer 2019 on her CT chest had progressive findings compared to 2 years earlier.  The presumption is that this ILD is caused by rheumatoid arthritis.  In the 2019 CT chest CT is reported as probable UIP but in my personal visualization is either indeterminate or alternative diagnosis.  In terms of symptoms: She reports insidious onset of shortness of breath for some years and gradually getting worse.  Level 2 dyspnea at rest.  Level for dyspnea taking a shower.  Level 5 dyspnea walking at her own pace of walking with others of her age and walking up stairs or walking up a hill.  She does  have a cough with yellow and green sputum.  She does cough at night.  There is also wheezing  Past Medical History : She has longstanding history of rheumatoid arthritis since the 1980s.  Used to be followed by Dr. Danie Binder.  As best as I can gather she used to be on methotrexate maybe 10 or 20 years ago.  She took it for a short while and then stopped it because of diarrhea.  She took this for less than 1 year.  She believes that after that she was basically on pain management.  She is only been on prednisone chronic intake for a total of 1 year approximately 4 to 5 years ago.  Around this time Dr. Hoyt Koch retired and she started seeing Dr. Gavin Pound.  Since then she is been on Arava and Plaquenil.  She has never seen any other immunomodulators in the setting of rheumatoid arthritis  according to history.  Overall the rheumatoid arthritis has caused some disability with deformed joints in her hands and also her using cane.    She is not on any anticoagulation is never had any blood clots.  In 2017 she had vascular extremity duplex venous that was negative for DVT.  Most recently she is been dealing with nonhealing ulcer of the left toe/metatarsal .  She had August and September 2018.  And then 1 in the latter part of 2019.  Most recently she says she has had a duplex lower extremity venous.  I do not have access to these results.  She is waiting on this.  Was done in the last few to several days.   She also has a longstanding history of asthma for which she is on Singulair.  Exam nitric oxide was elevated in August 2019 at 49 ppb but 07/31/2018 I snormal in the 20s though she is wheezing   She has nonobstructive coronary artery disease and in March 2018 had a right heart catheterization with elevated pulmonary pressures but also slightly elevated wedge pressure [see below].  She is started on Lasix  She has sleep apnea for which she uses CPAP.  She has obesity  ROS: Positive for arthralgia and arthritis.  Dry eyes and dry mouth but otherwise negative   FAMILY HISTORY of LUNG DISEASE: Denies   EXPOSURE HISTORY: Never smoked any cigarettes, marijuana, vaping, cocaine or intravenous drug use   HOME and HOBBY DETAILS : [place] lived [place] for 8 years.  No mold.  No dampness.  Does use humidifier and does use CPAP but there is no mold in it.  Also uses nebulizer machine but there is no mold.  No pet birds no musical instruments no guarding habits   OCCUPATIONAL HISTORY (122 questions) : Denies   PULMONARY TOXICITY HISTORY (27 items): She has taken nitrofurantoin 9 years ago.  Has not taken methotrexate for a year 5 years ago and is taken sulfasalazine       Results for JASMINEMARIE, SHERRARD (MRN [id number]) as of 07/31/2018 16:31  Ref. Range  09/03/2015 12:45 02/21/2017  FVC-Pre Latest Units: L 2.18 1.7L  FVC-%Pred-Pre Latest Units: % 84 66%   Results for MYESHA, STILLION (MRN [id number]) as of 07/31/2018 16:31  Ref. Range 09/03/2015 12:45  DLCO cor Latest Units: ml/min/mmHg 14.06  DLCO cor % pred Latest Units: % 69   HEART CATH MARCH 2018 Right Heart Pressures RA (mean): 13 mmHg RV (S/EDP): 38/12 mmHg PA (S/D, mean): 43/23 (32) mmHg PCWP (mean): 16  mmHg    Conclusions: Mild to moderate, non-obstructive coronary artery disease with 50% mid LAD and 20% proximal RCA stenoses. Mild to moderate pulmonary hypertension with mildly elevated right heart filling pressures. Upper normal left heart filling pressures. Normal Fick cardiac output/index. Equalization of end-diastolic pressures with ventricular concordance. This can be seen in the setting of restrictive process.   Recommendations: Escalate statin therapy to prevent progression of CAD. Increase furosemide to 40 mg BID and KCl to 40 mg BID. Patient to have BMP in 1 week to evaluate renal function and potassium. Continue outpatient pulmonary follow-up. I suspect underlying lung disease is the driving force behind the patient's dyspnea, pulmonary hypertension, and elevated right heart pressures. Outpatient follow-up in cardiology clinic in ~6 weeks.   Nelva Bush, MD   CT chest high res July 2019 - visualized personally - personaly opinion - indeterminate or alt dx for UIP but agree with progression  IMPRESSION: Lungs/Pleura: No pneumothorax. No pleural effusion. No acute consolidative airspace disease or lung masses. Few scattered small solid pulmonary nodules in mid to upper right lung, largest 4 mm in the right upper lobe (series 3/image 46), unchanged since 03/14/2016 CT, considered benign. No new significant pulmonary nodules. Mild patchy air trapping in both lungs on the expiration sequence, unchanged. Patchy peribronchovascular and subpleural reticulation and  ground-glass attenuation throughout both lungs with associated mild traction bronchiectasis and mild architectural distortion. There is a slight basilar gradient to these findings. Tiny focus of honeycombing in anterior right upper lobe (series 8/image 59). These findings have mildly worsened since 03/14/2016 chest CT. 1. Spectrum of findings compatible with fibrotic interstitial lung disease with a slight basilar predominance, with mild progression since 2017. 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 2. Mild patchy air trapping, unchanged, indicative of small airways disease. 3. Left main and two-vessel coronary atherosclerosis. 4. Stable dilated main pulmonary artery, suggesting chronic pulmonary arterial hypertension.   Aortic Atherosclerosis (ICD10-I70.0).     Electronically Signed   By: Ilona Sorrel M.D.   On: 02/02/2018 14:03        OV 03/15/2019  Subjective:  [name], female , DOB: [birth year] , age 44 y.o. , MRN: [id number] , ADDRESS: [place] [address]   03/15/2019 -   Chief Complaint  Patient presents with   IPD (Interstitial pulmonary disease)    Breathing is doing better since being on Esbriet. Has productive cough with green phelgm. Has been on antibiotic for last 6 weeks. Had hip replacement in June.     HPI [name] 76 y.o. 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  Started on Esbriet in March 2020.  Has progressive phenotype.   She presents for routine follow-up with her husband.  She tells me that ever since starting Esbriet in the spring 2020 she has been doing well.  She does not feel that the disease has progressed.  In fact she feels somewhat better.  She continues to use 4 L of oxygen.  She is now taking the Esbriet at 1 pill 3 times daily.  This is the  full dose of 1 pill.  Her last pulmonary function test was in early 2020.  It has been a while since she did her liver function test and she requires a repeat of that today.  She is willing to have her flu shot today.  She is  asking for prescription for emergency electric generator.  She does not know about the [employer] energy assistance program.  She has a handicap sticker.  She is interested in the ILD-PRO research registry study.    She is on Trelegy inhaler and Singulair I am not fully clear why   In June 2020 she had hip replacement for stress fracture and was on 6 weeks of Keflex.  She is slowly improving.  She uses a walker.  OV 05/17/2019  Subjective:  [name], female , DOB: [name] , age 54 y.o. , MRN: [id number] , ADDRESS: [place] [address]   05/17/2019 -   Chief Complaint  Patient presents with   Follow-up    Patient reports that she has had a productive cough with yellow sputum and her PCP placed her on an antibiotic.    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  Started on Esbriet in March 2020.  Has progressive phenotype. COVID negative 04/25/2019,. On 02 4L [place] since end 2019  Also hx of asthma  Also hx of chronic sinusitis - 2017 CT maxillary sinusits - DR Wilburn Cornelia  History of sleep apnea on CPAP  HPI [name] 76 y.o. -presents to the ILD clinic for the above issues.  She continues on pirfenidone since March 2020.  She says this is helping her.  No adverse effects.  Last liver function test was August 2020.  She needs another liver function test right now.  No nausea vomiting or diarrhea or weight loss.  She continues on 4 L of oxygen continuously 24/7.  At night she uses CPAP for her sleep apnea.  She and her husband think that she needs 6 L of oxygen at night and they are trying to increase the oxygen.  Although this is not based on data but on subjective reasoning.     New issue associated with sinus issues: Her main issue today is that for the last 3-week she has had worsening of her cough and there is associated yellow sputum.  Her COVID-19 test was -2 days ago.  This been going on for 3 weeks.  She tells me this happens to her every winter.  She is complaining of chronic postnasal drainage.  In 2017 she had a CT scan of the sinus that showed maxillary sinusitis and saw Dr. Wilburn Cornelia but since then has not followed up.  She does not have a history of sinus surgery.  She is frustrated by the cough and the yellow sputum.  Her pulmonary function test itself is stable since she started the [place].  She is interested in the ILD pro registry study.  In terms of her asthma: She is on Trelegy and Singulair.  She does not want to stop this.  There is a clinical history.  She says that if she stops this it gets worse.   In terms of her sleep apnea: She continues on CPAP with oxygen at night.    ROS    IMPRESSION: CT chest Jan  2020 1. Severe tracheobronchomalacia, more pronounced on today's scan. 2. Interval progression of basilar predominant fibrotic interstitial lung disease with particularly increased ground-glass component. No honeycombing. 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 Findings are consistent with UIP per consensus guidelines: Diagnosis of Idiopathic Pulmonary Fibrosis: An Official ATS/ERS/JRS/ALAT Clinical Practice Guideline. [place], Iss 5, [id number],  Mar 18 2017. 3. Stable dilated main pulmonary artery, suggesting chronic pulmonary arterial hypertension. 4. Left main and 3 vessel coronary atherosclerosis.   Aortic Atherosclerosis (ICD10-I70.0).     Electronically Signed   By: Ilona Sorrel M.D.   On: 08/20/2018 08:21   - per HPI  OV 04/02/2020  Subjective:  [name], female , DOB: [name] , age 60 y.o. 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  Started on Esbriet in March 2020.  Has progressive phenotype. COVID negative 04/25/2019,. On 02 4L [place] since end 2019  - on ild pro registry  Also hx of asthma -Singulair and inhaler  Also hx of chronic sinusitis - 2017 CT maxillary sinusits - DR Wilburn Cornelia  History of sleep apnea on CPAP  Rheumatoid arthritis seen by Dr. Gavin Pound on leflunomide, Plaquenil and daily prednisone 5 mg/day.  04/02/2020 -   Chief Complaint  Patient presents with   Follow-up    pt is here to go over pft results     HPI [name] 76 y.o. -presents for the above issues of interstitial lung disease in the setting of connective tissue disease.  She is on leflunomide and Plaquenil and prednisone with her rheumatologist.  She is on pirfenidone for her ILD.  I personally last saw her almost a year ago in October 2020.  After that she is seen nurse practitioner.  In the interim she says she is overall stable with the symptom score show a different story she has had significant worsening of her symptoms.  She continues to be morbidly obese.  Walking desaturation test she is desaturating to the point she is requiring 6 L to correct.  In fact early in August 2020 [place] and was extremely short of breath.  Even at rest on 3 L she was 89%.  She continues to use a CPAP.  Her husband who is here with her today would like her to lose weight in order to help her health status.  Pulmonary function test shows also a decline.  Her last CT scan of the chest was in June 2020    [place] 05/13/2020   Subjective:  [name], female , DOB: [birth year] [name], age 86 y.o. years. 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    - Started on Esbriet in March 2020.  Has progressive phenotype. COVID negative 04/25/2019,. On 02 4L [place] since end 2019  - on ild pro registry  Also hx of asthma -Singulair and inhaler  Also hx of chronic sinusitis - 2017 CT maxillary sinusits - DR Wilburn Cornelia  History of sleep apnea on CPAP  Rheumatoid arthritis seen by Dr. Gavin Pound on leflunomide, Plaquenil and daily prednisone 5 mg/day.  Elevated pulmonary artery pressures but PVR less than 3 in 2018: Right Heart Pressures RA (mean): 13 mmHg RV (S/EDP): 38/12 mmHg PA (S/D, mean): 43/23 (32) mmHg PCWP (mean): 16 mmHg  Ao sat: 94% PA sat: 70% RA sat: 75%  Fick  CO: 6.4 L/min Fick CI: 3.7 L/min/m^2  PVR: 2.5 Wood units     Chief Complaint  Patient presents with   Follow-up    more sob walking shorter distances.  coughing up yellow phlegm.       HPI [name] 76 y.o. -last visit September 2021.  At that time concern for progressive ILD.  Therefore we decided to PFTs and also high-resolution CT chest.  The high-resolution CT chest compared to last year shows stability.  The PFTs show stability within this year but progression compared to last year.  She is also lost weight following a low carbohydrate diet.  Nevertheless she feels the weight of her symptoms.  She is also using oxygen with exertion.  She feels this is getting worse.  Although her overall symptom burden seems to be improved with the weight loss.  Early morning she says there are some yellow phlegm which is stable for many many years.  This is mostly sinus.  There is no change in this.  She continues on immunosuppressants and antifibrotic.  There is no  side effects from these.  She is willing to have labs checked.  She has had a Covid vaccine including booster.  She does not know if she has antibody response yet.  She is willing to get this checked.  She is looking at options to get better.  She will participate in the ILD-pro registry visit today.  Review of the records indicate the last right eye catheterization was in 2018.  Pressures were borderline at that time.  She is willing to get this checked.  Her cardiologist is in [place].  She wants to get the procedure done in [place] and is willing to see Dr. Haroldine Laws or Dr. Aundra Dubin who are well versed with right heart catheterization locally.    Wt Readings from Last 3 Encounters:  05/13/20 202 lb 12.8 oz (92 kg)  04/02/20 207 lb 9.6 oz (94.2 kg)  02/05/20 218 lb (98.9 kg)       TECHNIQUE: CT chest sept 2021 Multidetector CT imaging of the chest was performed following the standard protocol without intravenous contrast. High resolution imaging of the lungs, as well as inspiratory and expiratory imaging, was performed.   COMPARISON:  08/16/2018, 02/02/2018, 03/14/2016, 08/27/2015   FINDINGS: Cardiovascular: Aortic atherosclerosis. Normal heart size. Three-vessel coronary artery calcifications. No pericardial effusion.   Mediastinum/Nodes: No enlarged mediastinal, hilar, or axillary lymph nodes. Thyroid gland, trachea, and esophagus demonstrate no significant findings.   Lungs/Pleura: Unchanged moderate pulmonary fibrosis in a pattern with apical to basal gradient featuring irregular peripheral interstitial opacity, septal thickening, ground-glass, and minimal tubular bronchiectasis without evidence of bronchiolectasis or honeycombing. Fibrotic findings are not significantly changed compared to immediate prior examination but significantly worsened over time on examinations dating back to 08/27/2015 occasional stable, definitively benign small pulmonary nodules, for  example a 3 mm nodule of the anterior right upper lobe (series 10, image 77). No significant air trapping on expiratory phase imaging. No pleural effusion or pneumothorax.   Upper Abdomen: No acute abnormality.   Musculoskeletal: No chest wall mass or suspicious bone lesions identified.   IMPRESSION: 1. Unchanged moderate pulmonary fibrosis in a pattern with apical to basal gradient featuring irregular peripheral interstitial opacity, septal thickening, ground-glass, and minimal tubular bronchiectasis without evidence of bronchiolectasis or honeycombing. Fibrotic findings are not significantly changed compared to immediate prior examination but significantly worsened over time on examinations dating back to occasional 08/27/2015. Findings remain consistent with a "  probable UIP" pattern by pulmonary fibrosis criteria and generally in keeping with reported history of rheumatoid arthritis and associated fibrotic interstitial lung disease. 2. Coronary artery disease.  Aortic Atherosclerosis (ICD10-I70.0).     Electronically Signed   By: Eddie Candle M.D.   On: 04/15/2020 15:35     OV 08/19/2020  Subjective:  [name], female , DOB: [birth year] , age 36 y.o. 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    - Started on Esbriet in March 2020.  Has progressive phenotype. COVID negative 04/25/2019,. On 02 4L [place] since end 2019  - on ild pro  registry  Also hx of asthma -Singulair and inhaler  Also hx of chronic sinusitis - 2017 CT maxillary sinusits - DR Wilburn Cornelia  History of sleep apnea on CPAP  Rheumatoid arthritis seen by Dr. Gavin Pound on leflunomide, Plaquenil and daily prednisone 5 mg/day.    HPI [name] 76 y.o. -presents with her husband.  Last seen in October 2021.  After that in January 2022 she was supposed to have a right heart catheterization because of concern of progressive ILD and her having autoimmune phenotype.  However middle of January 2022 she got sick with bronchitis.  She and her husband say that she gets this sick every year.  They do not know if it was Covid because she did not get tested.  She says primary care physician treated her with 10 days of 20 mg/day prednisone [at baseline she is on 5 mg/day of].  And also doxycycline for 10 days.  This is not helped.  Therefore she is now on amoxicillin and a second course of prednisone 20 mg/day for 10 days.  Her baseline dose is 5 mg prednisone.  She is not feeling better.  In fact her symptom scores are much worse.  Her pulse ox on room air at rest was 88%.  Previously room air at rest was fine.  She is frustrated by her symptoms.  She and her husband are frustrated by the symptom burden.  She is requesting Phenergan with codeine for cough control.     OV 09/03/2020  Subjective:  [name], female , DOB: [birth year] , age 78 y.o. , MRN: [id number] , ADDRESS: [place] [address] PCP Lorene Dy, MD Patient Care Team: Lorene Dy, MD as PCP - General (Internal Medicine) End, Harrell Gave, MD as PCP - Cardiology (Cardiology) Gavin Pound, MD as Consulting Physician (Rheumatology)  This Provider for this visit: Treatment Team:  Attending Provider: Brand Males, MD  Type of visit: Telephone/Video Circumstance: COVID-19 national emergency Identification of patient [name] with 04[birth year]13-[name]  and MRN [id number] - 2 person identifier Risks: Risks, benefits, limitations of telephone visit explained. Patient understood and verbalized agreement to proceed Anyone else on call: just patient Patient location: [phone] This provider location: [place]   09/03/2020 -  No chief complaint on file.    HPI Shanae  KATERYNA GRANTHAM 76 y.o. -this telephone visit this telephone visit is to follow-up from recent exacerbation early February 2022.  She is finished her prednisone she is feeling back to baseline.  She says her sinuses are congested but she will follow-up with the ENT.  She is still waiting for a right heart catheterization.  She has follow-up later in March 2022 with me.  Oxygen use is baseline.  She had Covid IgG checked and this was normal.  She continues to use a CPAP.  She is regular with all her medications.  She took consent form for PULSE inhaled nitric oxide study and she is interested.  Recent lab review shows slightly high calcium.  She does not know that she has this problem    PFT   OV 10/15/2020  Subjective:  [name], female , DOB: [name] , age 16 y.o. , MRN: [id number] , ADDRESS: [place] [address] PCP Lorene Dy, MD Patient Care Team: Lorene Dy, MD as PCP - General (Internal Medicine) End, Harrell Gave, MD as PCP - Cardiology (Cardiology) Gavin Pound, MD as Consulting Physician (Rheumatology)  This Provider for this visit: Treatment Team:  Attending Provider: Brand Males, MD    10/15/2020 -   Chief Complaint  Patient presents with   Follow-up    She felt she was breathing deeper before pft.      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      - Started on Esbriet in March 2020.  Has progressive phenotype. COVID negative 04/25/2019,. On 02 4L [place] since end 2019   - on ild pro registry  - last CT Sept 2021  - started PULSE iNO study march  2022 via study protocol -randomized trial  Also hx of asthma -Singulair and inhaler trelegy  Also hx of chronic sinusitis - 2017 CT maxillary sinusits - DR Wilburn Cornelia  History of sleep apnea on CPAP  Rheumatoid arthritis seen by Dr. Gavin Pound on leflunomide, Plaquenil and daily prednisone 5 mg/day.  Positive Covid IgG - Feb 2022   HPI [name] 76 y.o. -presents for standard of care visit.  She is now on inhaled nitric oxide study.  The inhaled nitric oxide is being bled through her oxygen.  She has to wear 12 hours a day.  She was randomized 2 days ago.  She tells me for the last 4 days she is having sinus complaints of yellow sinus drainage and increased cough.  She feels as acute sinusitis and she will benefit from antibiotics and prednisone.  Specifically she did not mention this at the time of randomization.  She denied this at the time of randomization but now she says it was present for 4 days.  She had pulmonary function test that shows continued decline in ILD.  Her oxygen use remains the same at 4 L and with exertion she uses 5 L.  She is on Trelegy for asthma.  Her CT scan of the chest in September did not show any evidence of emphysema.  She uses CPAP for her sleep apnea.  Labs from research were reviewed by me.  Her most recent hemoglobin is 12.  Creatinine and liver function test was normal.  Calcium is not available.   Of note after starting the inhaled nitric oxide she feels she can breathe more deeper.  However his symptom score shows continued decline.    OV 04/27/2021  Subjective:  [name],  female , DOB: [name] , age 7 y.o. , MRN: [id number] , ADDRESS: [place] [id number] PCP Lorene Dy, MD Patient Care Team: Lorene Dy, MD as PCP - General (Internal Medicine) End, Harrell Gave, MD as PCP - Cardiology (Cardiology) Gavin Pound, MD as Consulting Physician (Rheumatology)  This Provider for this  visit: Treatment Team:  Attending Provider: Brand Males, MD    04/27/2021 -   Chief Complaint  Patient presents with   Follow-up    Pt states she has been sick for about 6 weeks. Has been on prednisone as well as abx and is still not over it. States she is coughing getting up yellow-green phlegm and also has had some wheezing. Also has had increased SOB.      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    - Started on Esbriet in March 2020.  Has progressive phenotype. COVID negative 04/25/2019,. On 02 4L [place] since end 2019   - on ild pro registry  - started PULSE iNO study march 2022 via study protocol -randomized trial -> randomized to Open Label in sept 2022  - [place] 11/09/20   -   RA = 12 RV = 52/14 PA = 55/25 (38) PCW = 19 Fick cardiac output/index = 5.8/3.1 Thermo CO/CI = 7.4/4.0 PVR = 3.3 WU (Fick) 2.6 WU (thermo) FA sat = 99% PA sat = 71%, 71% SVC sat = 76% Given relatively normal PVR Hailey not benefit much from Tyvas - per cads    Also hx of asthma -Singulair and inhaler trelegy  - 0% eoios last checked 2019  Also hx of chronic sinusitis  - 2017 CT maxillary sinusits - DR Wilburn Cornelia  - CT sinus nov 2020 - celar  History of sleep apnea on CPAP  Rheumatoid arthritis seen by Dr. Gavin Pound on leflunomide, Plaquenil and daily prednisone 5 mg/day.  Positive Covid IgG - Feb 2022   HPI [name] 76 y.o. -presents for standard of care visit to the ILD center with [name].  She presents with her husband.  She tells me that since summer 2022 she is having increased sinus drainage.  For the last 6 weeks she is got more cough.  She feels there is a frog in the throat.  She brings yellow drainage from the sinuses.  The drainage increases with Nettie pot saline wash.  Sometimes she is clearing this from the throat.  Her husband says this happens every winter.  2 years ago we did CT scan of the sinus and it was  clear.  She has been on 2 antibiotics.  She felt cephalexin and azithromycin did not help.  She prefers to have doxycycline.  She has been on 1 prednisone 10-day taper.  She feels that she wants another course of doxycycline and prednisone.  She also states that if she takes antibiotics she will want Diflucan.  Her last sinus CT was 2 years ago and her last CT chest was 1 year ago.  She also reports cough and chest tightness.  Worsening shortness of breath.  Despite subjective reporting of worsening shortness of breath subjective objective dyspnea score on the scoring scale looks the same  She continues on antifibrotic pirfenidone.  She is tolerating this well.  She is also on inhaled nitric oxide open label with the oxygen.  Her pulmonary function test shows continued decline.  Because of her symptoms we did a nitric oxide exhaled test and this was high.  Unclear if this because of inhaled nitric oxide.  Research of the study sponsor on this.    Lab Results  Component Value Date   NITRICOXIDE 139 04/27/2021   Xxxxxxxxxxxxxxxxxxxxxx  05/24/2021 -  Followup Test results - video visit   HPI [name] 76 y.o. - standard of care visit to discuss CT  and lab results  Per research team:  She rated her shortness of breath today at a 5 out of 5 today and has been since July 18th, before it was 4 out of 5. Her Borg Dyspnea score was 3 (moderate) at rest the last two visits and before that only rated it 1 (slight) back in June. On 6wmd: he walked 153 meters today compared to 120 meters on 03/29/2021. SHe is currently on Open Labvel extensio of the inhaled NO Study-she was able to walk just since 6 L.  She is now on scheduled to see research team every 4 months.   102md  - 133m start of study - 167 m I June 2022 - 120 m sept 2022 -> open labe inhaled NO rolled over July 2022 - 1543m1/7/22 - needed 6L . -> improved  Labs  - ANCA - normal - IgE and RAST allergy panel negative - PFT shows  continued decline    Overall it appears based on pulmonary function test and CT scan of the chest she is declined significantly.  She also has severe tracheobronchomalacia which has not been documented in the prior CT scan.  I did discuss the progression with her.  She indicated to me that she was worried about her progression.  She appears to be on optimal antifibrotic therapy with pirfenidone.  We reviewed her rheumatoid arthritis medication she no longer on Plaquenil.  Recent documented above.  Subsequently did discuss with Dr. AnGavin Pound We considered possibility of using Actemra which in scleroderma ILD is shown lung stabilization/modulation effect.  She will try to make a switch for this if insurance would approve.  Did indicate to patient that if she continued to decline then we would involve palliative care at home.  At this point in time we resolved that she will continue with the research protocol. The tracheobronchomalacia is the likely reason for exertional wheezing.      OV 09/16/2021  Subjective:  Patient ID: DoBarrie Dunkerfemale , DOB: [birth year][name] age 76.o. , MRN: [id number] ADDRESS: [id number] RoLorene DyMD Patient Care Team: RoLorene DyMD as PCP - General (Internal Medicine) End, ChHarrell GaveMD as PCP - Cardiology (Cardiology) HaGavin PoundMD as Consulting Physician (Rheumatology)  This Provider for this visit: Treatment Team:  Attending Provider: RaBrand MalesMD    09/16/2021 -   Chief Complaint  Patient presents with   Follow-up    Pt states her breathing has become better since stopping the nitric oxide that she was doing with research. States she does have an occasional cough.     HPI DoANNETH BRUNELL89.o. -presents with her husband.  On 08/24/2021 she completely withdrew from the inhaled nitric oxide open label extension part of the study.  The coordinator did mention that to me back then.  She  is simply not interested in the study.  In fact she refused even participate in the follow-up visits without the investigational medical product.  She just wants to be done with it.  She was frustrated with the device in the  machine and the noise.  In addition she felt that the inhaled nitric oxide was making the sinus situation worse.  She believes that after going to open label extension she got worse.  Since stopping the inhaler anticoagulate her sinus complaints are better.  She feels also is that she is less short of breath.  Of note she has now had 3 doses once monthly of Actemra for rheumatoid arthritis.  Correlating with this the husband notices that she is using her cane less and she is in less pain and she is moving better. For chronic respiratory failure she uses 6 L nasal cannula continuous at rest and with exertion.  Overall she feels better.  Symptom score is below.  She still continues to participate in the ILD-Pro registry and today she has a visit for that.        OV 12/08/2021  Subjective:  [name], female , DOB: [birth year] [name] , age 74 y.o. , MRN: [id number] , ADDRESS: [place] [phone] PCP Lorene Dy, MD Patient Care Team: Lorene Dy, MD as PCP - General (Internal Medicine) End, Harrell Gave, MD as PCP - Cardiology (Cardiology) Gavin Pound, MD as Consulting Physician (Rheumatology)  This Provider for this visit: Treatment Team:  Attending Provider: Brand Males, MD    12/08/2021 -   Chief Complaint  Patient presents with   Follow-up    PFT performed today.  Pt states she feels like her breathing has gotten better since last visit.     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    - [facility] Oct 2022  - Started on Esbriet in March 2020.  Has progressive phenotype. COVID negative 04/25/2019,.   - on ild pro registry  - started PULSE iNO study march 2022 via study protocol -randomized trial -> randomized to Open Label in sept 2022 -> stopped 08/24/21 due to sinus congestion and inconvieniece  - [facility] 11/09/20   -   RA = 12 RV = 52/14 PA = 55/25 (38) PCW = 19 Fick cardiac output/index = 5.8/3.1 Thermo CO/CI = 7.4/4.0 PVR = 3.3 WU (Fick) 2.6 WU (thermo) FA sat = 99% PA sat = 71%, 71% SVC sat = 76% Given relatively normal PVR Hailey not benefit much from Tyvaso - per cads    Also hx of chronic sinusitis  - 2017 CT maxillary sinusits - DR SWilburn Cornelia - CT sinus nov 2020 and October 2022- celar  - normal ANCA panel Oct 2022  History of sleep apnea on CPAP  Rheumatoid arthritis seen by Dr. AGavin Poundon -  leflunomide,  - daily prednisone 5 mg/day.  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 - presemnt wth husband. Reports now needing 7L based on subjective needs. Taking mucinex.  Cough some more productive than useal but main thing is dyspnea  scor worse. Last RHC over a year ago.  Taking esbriet, actemra, prednisone and arava. Uses walker. HAs Left hip pain and also left shoulder pain. Not a surgical candidate. PFT today shows some decline compared to a year ago. Explained to her that while on iNO study her PFT actually improved in the short run. She finds that difficult to fathom because she did not tolerate the OLE   Goals of care - full code   SYMPTOM SCALE - ILD 03/15/2019  05/17/2019   04/02/2020  05/13/2020   08/19/2020 covid 10/15/2020  04/27/2021 210# 09/16/2021  12/08/2021 204#  O2 use x      5L [place] - ino study 6L [place] . Off iNO study 7L [place] wth home concerntator  Shortness of Breath 0 -> 5 scale with 5 being worst (score 6 If unable to do)          At rest 0 0 4 0 _0 0 3  Simple tasks - showers, clothes change, eating, shaving 0 0 _1 Household (dishes, doing bed, laundry) _2 Shopping _3 Walking level at own pace _4 Walking up Stairs _5 Total (40 - 48) Dyspnea Score _6 How bad is your cough? _7 0 _8 How bad is your fatigue _9 nauea   0 0 0 0 0 0 0  vomit   0 0 0 0 0 0 0  diarrhea   _10 anxiety   _11 depression   _12 Simple office walk 185 feet x  3 laps goal with forehead probe 03/15/2019 - uses 4L [place] at home  04/02/2020   O2 used Room air Walked on 3L [place]  Number laps completed 1/2 lap ad desat to 88%   Comments about pace    Resting Pulse Ox/HR  97% and 75  Final Pulse Ox/HR  88% and 108 at 2nd laps  Desaturated </= 88%    Desaturated <= 3% points    Got Tachycardic >/= 90/min    Symptoms at end of test    Miscellaneous comments -walked with 4L [place] - uses 4L [place] at home Needed 6L [place] to correct   Results for ZEENA, STARKEL (MRN [id number]) as of 04/27/2021 11:38  Ref. Range 09/03/2015 12:45 10/01/2018 14:45 05/17/2019 08:59 10/02/2019 10:50 04/01/2020 11:24 09/11/2020 14:06 covid 10/15/2020 09:59 12/08/2020 09:03 02/02/2021 08:12 researc 03/29/2021 14:07  research 05/24/21 research 12/08/2021    FVC-Pre Latest Units: L 2.18 1.81 1.82 1.62 1.60 1.58 1.47 1.45 2.00 1.54 1.44L 1.38  FVC-%Pred-Pre Latest Units: % 84 72 74 66 65 65 63 63 82 63 60%   Results for DEREONNA, LENSING (MRN [id number]) as of 04/27/2021 11:38  Ref. Range 09/03/2015 12:45 10/01/2018 14:45 05/17/2019 08:59 10/02/2019 10:50 04/01/2020  11:24 09/11/2020 14:06 10/15/2020 09:59 12/08/2020 09:03 02/02/2021 08:12 research 03/29/2021 14:07  research 05/24/76 research 12/08/2021   DLCO unc Latest Units: ml/min/mmHg 14.82 11.94 11.28  10.42 11.37 7.59 9.05 9.05 9.22 6.95 8.17 7.13  DLCO unc % pred Latest Units: % 73 69 65 60 66 44 54 54 53 40 45%     HRCT Oct 2022   Narrative & Impression  CLINICAL DATA:  76 year old female with history of interstitial lung disease. Worsening coughing.   EXAM: CT CHEST WITHOUT CONTRAST   TECHNIQUE: Multidetector CT imaging of the chest was performed following the standard protocol without intravenous contrast. High resolution imaging of the lungs, as well as inspiratory and expiratory imaging, was performed.   COMPARISON:  Chest CT 04/15/2020.   FINDINGS: Cardiovascular: Heart size is normal. There is no significant pericardial fluid, thickening or pericardial calcification. There is aortic atherosclerosis, as well as atherosclerosis of the great vessels of the mediastinum and the coronary arteries, including calcified atherosclerotic plaque in the left main, left anterior descending, left circumflex and right coronary arteries. Calcifications of the mitral annulus.   Mediastinum/Nodes: No pathologically enlarged mediastinal or hilar lymph nodes. Esophagus is unremarkable in appearance. No axillary lymphadenopathy.   Lungs/Pleura: High-resolution images demonstrate widespread but patchy areas of ground-glass attenuation, septal thickening, scattered subpleural reticulation, thickening of the peribronchovascular interstitium, some scattered regions of mild cylindrical bronchiectasis and peripheral bronchiolectasis, but no frank honeycombing. Findings have no discernible craniocaudal gradient. Findings are progressive compared to the prior examination. Inspiratory and expiratory imaging demonstrates extensive air trapping indicative of small airways disease. As well, there is severe  collapse of the trachea and mainstem bronchi during expiration indicative of tracheobronchomalacia. No acute consolidative airspace disease. No pleural effusions. 4 mm right middle lobe pulmonary nodule (axial image 70 of series 5), previously only 2 mm. 4 mm subpleural nodule in the periphery of the right upper lobe (axial image 46 of series 5), stable compared to the prior examination. No other larger more suspicious appearing pulmonary nodules or masses are noted.   Upper Abdomen: Subcentimeter low-attenuation lesion in segment 7 of the liver, incompletely characterized on today's non-contrast CT examination, but statistically likely to represent a small cyst.   Musculoskeletal: There are no aggressive appearing lytic or blastic lesions noted in the visualized portions of the skeleton.   IMPRESSION: 1. The appearance of the lungs is compatible with progressive interstitial lung disease, with a spectrum of findings considered most compatible with an alternative diagnosis (not usual interstitial pneumonia) per current ATS guidelines. Specifically, findings are strongly favored to represent chronic hypersensitivity pneumonitis. 2. Slight interval enlargement of a 4 mm right middle lobe pulmonary nodule. This is nonspecific, but attention on repeat high-resolution chest CT is recommended in 12 months to ensure stability or regression of this finding. 3. Severe tracheobronchomalacia. 4. Aortic atherosclerosis, in addition to left main and 3 vessel coronary artery disease. Assessment for potential risk factor modification, dietary therapy or pharmacologic therapy Hailey be warranted, if clinically indicated. 5. There are calcifications of the mitral annulus. Echocardiographic correlation for evaluation of potential valvular dysfunction Hailey be warranted if clinically indicated.   Aortic Atherosclerosis (ICD10-I70.0).     Electronically Signed   By: Vinnie Langton M.D.   On:  05/16/2021 07:58   PFT    has a past medical history of Acute asthmatic bronchitis, Allergic rhinitis, Anxiety, Arthritis, Esophageal reflux, Hypertension, Interstitial lung disease ([place]) (dx jan 2020), Irritable bowel syndrome, Oxygen dependent, PONV (postoperative nausea and vomiting), Rheumatoid arthritis(714.0), and Sleep apnea.   reports that she has never smoked. She has never used smokeless tobacco.  Past Surgical History:  Procedure Laterality Date  c secttion  1977   2 foot fusions Left    total of 6 left foot sx   ABDOMINAL HYSTERECTOMY     complete    ANKLE FUSION  2009   left   BACK SURGERY     lower l to l 5 fused   CHOLECYSTECTOMY     RIGHT HEART CATH N/A 11/09/2020   Procedure: RIGHT HEART CATH;  Surgeon: Jolaine Artist, MD;  Location: [facility];  Service: Cardiovascular;  Laterality: N/A;   RIGHT/LEFT HEART CATH AND CORONARY ANGIOGRAPHY N/A 09/16/2016   Procedure: Right/Left Heart Cath and Coronary Angiography;  Surgeon: Nelva Bush, MD;  Location: [facility];  Service: Cardiovascular;  Laterality: N/A;   TONSILLECTOMY     TOTAL HIP ARTHROPLASTY Right 12/20/2018   Procedure: TOTAL HIP ARTHROPLASTY ANTERIOR APPROACH;  Surgeon: Paralee Cancel, MD;  Location: [facility];  Service: Orthopedics;  Laterality: Right;  70 mins   TOTAL KNEE ARTHROPLASTY Bilateral     Allergies  Allergen Reactions   Cefdinir Diarrhea   Erythromycin Base Diarrhea   Lactose Dermatitis   Lactulose Diarrhea   Mesalamine Nausea Only   Methotrexate Other (See Comments)    Felt sick   Nitrofuran Derivatives     shakiness   Other Diarrhea and Other (See Comments)    Shaking uncontrollably "lettuce only" "lettuce only"   Sulfa Antibiotics Other (See Comments)    Patient can't recall reaction    Tdap [Tetanus-Diphth-Acell Pertussis]     Shaking uncontrollably   Tetanus Toxoid, Adsorbed Other (See Comments)    Shaking uncontrollable     Immunization History   Administered Date(s) Administered   Fluad Quad(high Dose 65+) 03/15/2019   Influenza Split 03/18/2012, 03/18/2013, 04/14/2014, 04/17/2017   Influenza Whole 04/30/2008, 04/20/2009, 04/18/2011   Influenza, High Dose Seasonal PF 04/23/2015, 03/24/2016, 04/06/2018, 04/02/2020, 03/24/2021   Influenza,inj,quad, With Preservative 04/06/2018   PFIZER(Purple Top)SARS-COV-2 Vaccination 08/24/2019, 09/18/2019, 04/13/2020   Pneumococcal Conjugate-13 11/11/2013   Pneumococcal Polysaccharide-23 05/21/2018   Zoster Recombinat (Shingrix) 05/05/2017    Family History  Problem Relation Age of Onset   Heart disease Mother    Arthritis Mother    Heart attack Father    Diabetes Other        sibling   Heart attack Other        sibling     Current Outpatient Medications:    albuterol (VENTOLIN HFA) 108 (90 Base) MCG/ACT inhaler, USE 2 INHALATIONS BY MOUTH  EVERY 6 HOURS AS NEEDED, Disp: 34 g, Rfl: 1   aspirin EC 81 MG tablet, Take 81 mg by mouth daily., Disp: , Rfl:    benzonatate (TESSALON) 200 MG capsule, Take 1 capsule (200 mg total) by mouth 3 (three) times daily as needed for cough., Disp: 90 capsule, Rfl: 2   celecoxib (CELEBREX) 200 MG capsule, Take 200 mg by mouth daily., Disp: , Rfl:    cetirizine (ZYRTEC) 10 MG tablet, Take 20 mg by mouth daily., Disp: , Rfl:    Cholecalciferol (VITAMIN D3) 250 MCG (10000 UT) capsule, Take 10,000 Units by mouth 4 (four) times a week., Disp: , Rfl:    clidinium-chlordiazePOXIDE (LIBRAX) 5-2.5 MG capsule, Take 1 capsule by mouth 3 (three) times daily as needed (for IBS)., Disp: , Rfl:    colestipol (COLESTID) 1 g tablet, Take 2 g by mouth daily., Disp: , Rfl:    dicyclomine (BENTYL) 20 MG tablet, Take 20 mg by mouth 4 (four) times daily as needed for spasms.,  Disp: , Rfl:    FLUoxetine (PROZAC) 20 MG capsule, Take 20 mg by mouth daily., Disp: , Rfl:    fluticasone (FLONASE) 50 MCG/ACT nasal spray, SPRAY 2 SPRAYS INTO EACH NOSTRIL EVERY DAY, Disp: 48 mL, Rfl:  3   furosemide (LASIX) 40 MG tablet, TAKE 1 TABLET BY MOUTH EVERY DAY, Disp: 30 tablet, Rfl: 7   ipratropium (ATROVENT) 0.03 % nasal spray, Use 1-2 sprays in both nostrils twice daily., Disp: 90 mL, Rfl: 3   ipratropium-albuterol (DUONEB) 0.5-2.5 (3) MG/3ML SOLN, USE 1 VIAL IN NEBULIZER EVERY 6 HOURS AS NEEDED J45.901, Disp: 270 mL, Rfl: 11   KLOR-CON M20 20 MEQ tablet, TAKE 1 TABLET BY MOUTH TWICE A DAY, Disp: 180 tablet, Rfl: 3   leflunomide (ARAVA) 20 MG tablet, Take 20 mg by mouth daily. , Disp: , Rfl:    methocarbamol (ROBAXIN) 750 MG tablet, Take 750 mg by mouth every 6 (six) hours as needed for muscle spasms., Disp: , Rfl:    montelukast (SINGULAIR) 10 MG tablet, Take 1 tablet (10 mg total) by mouth at bedtime., Disp: 90 tablet, Rfl: 3   omeprazole (PRILOSEC) 40 MG capsule, Take 40 mg by mouth at bedtime. , Disp: , Rfl:    OXYGEN, Inhale 4 mLs into the lungs continuous. , Disp: , Rfl:    Pirfenidone (ESBRIET) 801 MG TABS, Take 801 mg by mouth 3 (three) times daily., Disp: 270 tablet, Rfl: 3   predniSONE (DELTASONE) 5 MG tablet, Take 5 mg by mouth daily with breakfast., Disp: , Rfl:    rosuvastatin (CRESTOR) 20 MG tablet, TAKE 1 TABLET BY MOUTH DAILY, Disp: 90 tablet, Rfl: 0   Tocilizumab (ACTEMRA IV), Inject into the vein every 30 (thirty) days. infusion, Disp: , Rfl:    traMADol (ULTRAM) 50 MG tablet, Take 50 mg by mouth 3 (three) times daily as needed for moderate pain. Can take up to 4 pills a day as needed., Disp: , Rfl:    traZODone (DESYREL) 50 MG tablet, Take 50 mg by mouth at bedtime., Disp: , Rfl:    TRELEGY ELLIPTA 100-62.5-25 MCG/ACT AEPB, USE 1 INHALATION BY MOUTH DAILY, Disp: 180 each, Rfl: 3   verapamil (CALAN-SR) 240 MG CR tablet, TAKE 1 TABLET BY MOUTH  DAILY, Disp: 90 tablet, Rfl: 0      Objective:   Vitals:   12/08/21 1540  BP: 136/78  Pulse: 76  SpO2: 91%  Weight: 204 lb 3.2 oz (92.6 kg)  Height: _0  (1.549 m)    Estimated body mass index is 38.58 kg/m as  calculated from the following:   Height as of this encounter: _1  (1.549 m).   Weight as of this encounter: 204 lb 3.2 oz (92.6 kg).  _2 @  Filed Weights   12/08/21 1540  Weight: 204 lb 3.2 oz (92.6 kg)     Physical Exam   General: No distress. Obese. Husband with her. Has a walker Neuro: Alert and Oriented x 3. GCS 15. Speech normal Psych: Pleasant Resp:  Barrel Chest - no.  Wheeze - no, Crackles - yes, No overt respiratory distress CVS: Normal heart sounds. 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 PEERL +. No post nasal drip        Assessment:       ICD-10-CM   1. Chronic respiratory failure with hypoxia (HCC)  J96.11 CBC with Differential/Platelet    Basic metabolic panel    Hepatic function panel  Brain natriuretic peptide    2. ILD (interstitial lung disease) (HCC)  J84.9 CBC with Differential/Platelet    Basic metabolic panel    Hepatic function panel    Brain natriuretic peptide    3. Shortness of breath  R06.02 CBC with Differential/Platelet    Basic metabolic panel    Hepatic function panel    Brain natriuretic peptide         Plan:     Patient Instructions  Chronic respiratory failure with hypoxia (HCC) Interstitial lung disease due to connective tissue disease (HCC) High risk medication use Therapeutic drug monitoring REsearch subject - Pulse iNO study - randomized 10/13/20 and rolled over open label 03/29/21 -.withdrew 08/24/21  -Progressive interstitial lung disease - clinically slightly progressive since [place] 2022 (1 year ago). Now using 7L [place] rest f  - Last HRCT Oct 2022  - Plan ---Continue using oxygen 7 L at rest and  more  with exertion  -Monitor pulse ox with exertion and keep it over 86% -Continue participation in ILD-pro research registry -Continue pirfenidone  - Continue RA Rx with Dr. Gavin Pound: -  Actemra, leflunomade, prednisone, flonase, celebrex - check cbc,  bmet, LFT, and BNP - Likely need repeat Right Heart Cath  - will messsage Dr Jeffie Pollock  -  if there is pulmonary hypertension - then might qualify for tyvaso - do spirometry and dlco in 2-3 months  Obstructive sleep apnea  Plan -Continue CPAP  Chrnonic persistnet sinusitis    - In control   Plan - continue regular flonase and netti pot and atrovent nasal spray snf cetrizine -  Asthma/tracheobronchomalacia -Not in flareup.    Plan  - continue trelegy scheduled  - use alb as needed   Followup - 2-3 month s ace to face -30-minute visit [name] but after spirometry and dlco  - symptoms score and pulse ox on room air at rest and walk test on 5-6L [place] at followup  - will aim for HRCT Oct 2023  ( Level 05 visit: Estb 40-54 min    visit type: on-site physical face to visit  in total care time and counseling or/and coordination of care by this undersigned MD - Dr Brand Males. This includes one or more of the following on this same day 12/08/2021: pre-charting, chart review, note writing, documentation discussion of test results, diagnostic or treatment recommendations, prognosis, risks and benefits of management options, instructions, education, compliance or risk-factor reduction. It excludes time spent by the [place] or office staff in the care of the patient. Actual time 64 min)   SIGNATURE    Dr. Brand Males, M.D., F.C.C.P,  Pulmonary and Critical Care Medicine Staff Physician, [place] Director - Interstitial Lung Disease  Program  P[facility] [place], [place], [address]  Pager: [phone], If no answer or between  15:00h - 7:00h: call [phone] Telephone: [phone]  11:48 PM 12/08/2021

## 2021-12-08 NOTE — Progress Notes (Signed)
[name] and dlco done today.

## 2021-12-08 NOTE — H&P (View-Only) (Signed)
05/21/2018- 76 year old female never smoker followed for allergic rhinitis, rhinosinusitis, asthmatic bronchitis, ILD, OSA,complicated by GERD/Crohn's, Rheumatoid arthritis/prednisone -----4 month follow up for asthma and OSA. Per patient she is still having issues with SOB. She has had 2 foot surgeries since her last visit. States that the [name] is working but [name] wanted to switch her to something else. Unsure of the medication name and I did not see it in his last OV.  Singulair, and Anoro, neb duoneb CPAP auto 5-15/Advanced   Download 100% compliance AHI 1.3/hour. I discussed FENO and she will try Symbicort. Discussed inhaled steroids.  We need to get her a new AeroChamber. Has been on sustained antibiotics for failed appliance in her foot. Notices some runny nose and scant bland mucus.  Wheezing has been well controlled.  Little cough.  She and her husband recognize inability to get any exercise as a substantial reason for dyspnea.  No acute events.  Cardiology continues to follow, including her pulmonary hypertension.  We reviewed her most recent chest CT and discussed implications of progressive interstitial fibrosis.  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  I would like to refer her to [name] for his opinion and advice. FENO 03/15/18  49 H CT chest Hi Res 02/02/2018- IMPRESSION: 1. Spectrum of findings compatible with fibrotic interstitial lung disease with a slight basilar predominance, with mild progression since 2017. 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 2. Mild patchy air trapping, unchanged, indicative of small airways disease. 3. Left main and two-vessel coronary atherosclerosis. 4. Stable dilated main pulmonary artery, suggesting chronic pulmonary arterial hypertension. Aortic Atherosclerosis (ICD10-I70.0).  06/08/18-  76 year old female never smoker followed for allergic rhinitis, rhinosinusitis, asthmatic bronchitis, ILD, OSA,complicated by GERD/Crohn's, Rheumatoid arthritis/prednisone -----coughing for 2 weeks, yellow, green mucus, SOB  Singulair, Atrovent nasal spray, Symbicort 160, and Anoro, neb albuterol, neb DuoNeb, Complains of increased cough with chest congestion, green/yellow sputum, low-grade fever 99 degrees over the last 2 weeks.  Not on prednisone now.  Last antibiotic in October. Pending ILD consultation with [name] in December.   07/02/2018  - Visit   76 year old female patient presenting today for acute visit.  Patient was recently treated with a course of Augmentin and reports sputum color is now light green but has decreased in sputum amount since last office visit.  Patient does not feel she is getting better.  Patient remains adherent to Anoro Ellipta.  On arrival to our office today patient was short of breath and oxygen saturations were 81%.  With deep breathing patient was able to increase oxygenation to 85%.  When placed on 2 L O2 patient was able to maintain at 91%.  Patient had previously been on oxygen in 2015 but this was later [facility].  Patient's weight has also increased over the previous office visits, patient remains adherent to diuretic treatment plan as on taking 1 diuretic daily.  Patient reports she is allowed to take an additional diuretic when she feels necessary.  Unfortunately patient has not been weighing herself regularly there is no set plan to when she should take her second diuretic.  Patient is exceptionally sedentary at home and rarely gets out of her bed or lazy boy chair which is about 3 feet from her bed.  Patient's husband takes care of patient completely including getting drinks and food so patient does not have to leave her lazy  boy recliner.  Patient watches many television shows and does not report much activity.   Patient has completed follow-up with  rheumatology last week and no changes to her medications were made.  Patient reports that she was stable at the time.  CPAP compliance report showing 30 out of 30 days used.  All 30 those days greater than 4 hours.  APAP settings 5-15.  Average usage days 7 hours and 56 minutes.  AHI 0.8.    OV 07/31/2018 -referred to interstitial lung disease clinic  Subjective:  [name], female , DOB: Nov 18, 1945 , age 59 y.o. , MRN: [id number] , ADDRESS: [address] [place] [address]   07/31/2018 -   Chief Complaint  Patient presents with   Follow-up    [name] that stated to be referred to ILD clinic.  Pt currently has complaints of wheezing which she has had since December 2019, worsening SOB when moving around and also states she has a pain in left side of chest near shoulder. Pt wears between 2-4L O2 but mainly has been wearing 4L at home.      HPI MALAYA CAGLEY 76 y.o. -accompanied by her husband.  Referred by [name] pulmonologist in our practice for evaluation of interstitial lung disease in the setting of rheumatoid arthritis.  History is gained from her, talking to her husband, review of recent office visits and also the interstitial lung disease questionnaire.  As best as I can gather   [place] ILD Questionnaire  Symptoms: She is known to have ILD on a CT chest in 2017 but she is not aware of it.  She says that she follows normally for sleep apnea and asthma with [name].  She recollects that approximately 11 or 12 years ago she was hospitalized for pneumonia and was on oxygen for short while not otherwise specified.  Then came off oxygen.  After that overall she is been stable and sedentary using her CPAP.  She is mostly sedentary because of obesity, rheumatoid arthritis and also because of the nonhealing ulcer in her feet for which she is been advised sedentary lifestyle to enable healing.  Most recently to  Thanksgiving 2019 she was seen for respiratory exacerbation and given antibiotics and prednisone.  She followed up mid December 2019 and was found to have new onset hypoxemia 85% on room air.  She was then discharged on portable oxygen which she is using 4 L nasal cannula at rest.  She tells me that at home when she comes off of CPAP she is in her 85s on room air at rest.  In fact in the office today she is 83% on room air at rest but 93% on 2 L nasal cannula at rest.  There is concerned that she has worsening ILD particularly summer 2019 on her CT chest had progressive findings compared to 2 years earlier.  The presumption is that this ILD is caused by rheumatoid arthritis.  In the 2019 CT chest CT is reported as probable UIP but in my personal visualization is either indeterminate or alternative diagnosis.  In terms of symptoms: She reports insidious onset of shortness of breath for some years and gradually getting worse.  Level 2 dyspnea at rest.  Level for dyspnea taking a shower.  Level 5 dyspnea walking at her own pace of walking with others of her age and walking up stairs or walking up a hill.  She does  have a cough with yellow and green sputum.  She does cough at night.  There is also wheezing  Past Medical History : She has longstanding history of rheumatoid arthritis since the 1980s.  Used to be followed by Dr. Danie Binder.  As best as I can gather she used to be on methotrexate maybe 10 or 20 years ago.  She took it for a short while and then stopped it because of diarrhea.  She took this for less than 1 year.  She believes that after that she was basically on pain management.  She is only been on prednisone chronic intake for a total of 1 year approximately 4 to 5 years ago.  Around this time Dr. Hoyt Koch retired and she started seeing Dr. Gavin Pound.  Since then she is been on Arava and Plaquenil.  She has never seen any other immunomodulators in the setting of rheumatoid arthritis  according to history.  Overall the rheumatoid arthritis has caused some disability with deformed joints in her hands and also her using cane.    She is not on any anticoagulation is never had any blood clots.  In 2017 she had vascular extremity duplex venous that was negative for DVT.  Most recently she is been dealing with nonhealing ulcer of the left toe/metatarsal .  She had August and September 2018.  And then 1 in the latter part of 2019.  Most recently she says she has had a duplex lower extremity venous.  I do not have access to these results.  She is waiting on this.  Was done in the last few to several days.   She also has a longstanding history of asthma for which she is on Singulair.  Exam nitric oxide was elevated in August 2019 at 49 ppb but 07/31/2018 I snormal in the 20s though she is wheezing   She has nonobstructive coronary artery disease and in March 2018 had a right heart catheterization with elevated pulmonary pressures but also slightly elevated wedge pressure [see below].  She is started on Lasix  She has sleep apnea for which she uses CPAP.  She has obesity  ROS: Positive for arthralgia and arthritis.  Dry eyes and dry mouth but otherwise negative   FAMILY HISTORY of LUNG DISEASE: Denies   EXPOSURE HISTORY: Never smoked any cigarettes, marijuana, vaping, cocaine or intravenous drug use   HOME and HOBBY DETAILS : [place] lived [place] for 8 years.  No mold.  No dampness.  Does use humidifier and does use CPAP but there is no mold in it.  Also uses nebulizer machine but there is no mold.  No pet birds no musical instruments no guarding habits   OCCUPATIONAL HISTORY (122 questions) : Denies   PULMONARY TOXICITY HISTORY (27 items): She has taken nitrofurantoin 9 years ago.  Has not taken methotrexate for a year 5 years ago and is taken sulfasalazine       Results for JASMINEMARIE, SHERRARD (MRN [id number]) as of 07/31/2018 16:31  Ref. Range  09/03/2015 12:45 02/21/2017  FVC-Pre Latest Units: L 2.18 1.7L  FVC-%Pred-Pre Latest Units: % 84 66%   Results for MYESHA, STILLION (MRN [id number]) as of 07/31/2018 16:31  Ref. Range 09/03/2015 12:45  DLCO cor Latest Units: ml/min/mmHg 14.06  DLCO cor % pred Latest Units: % 69   HEART CATH MARCH 2018 Right Heart Pressures RA (mean): 13 mmHg RV (S/EDP): 38/12 mmHg PA (S/D, mean): 43/23 (32) mmHg PCWP (mean): 16  mmHg    Conclusions: Mild to moderate, non-obstructive coronary artery disease with 50% mid LAD and 20% proximal RCA stenoses. Mild to moderate pulmonary hypertension with mildly elevated right heart filling pressures. Upper normal left heart filling pressures. Normal Fick cardiac output/index. Equalization of end-diastolic pressures with ventricular concordance. This can be seen in the setting of restrictive process.   Recommendations: Escalate statin therapy to prevent progression of CAD. Increase furosemide to 40 mg BID and KCl to 40 mg BID. Patient to have BMP in 1 week to evaluate renal function and potassium. Continue outpatient pulmonary follow-up. I suspect underlying lung disease is the driving force behind the patient's dyspnea, pulmonary hypertension, and elevated right heart pressures. Outpatient follow-up in cardiology clinic in ~6 weeks.   Nelva Bush, MD   CT chest high res July 2019 - visualized personally - personaly opinion - indeterminate or alt dx for UIP but agree with progression  IMPRESSION: Lungs/Pleura: No pneumothorax. No pleural effusion. No acute consolidative airspace disease or lung masses. Few scattered small solid pulmonary nodules in mid to upper right lung, largest 4 mm in the right upper lobe (series 3/image 46), unchanged since 03/14/2016 CT, considered benign. No new significant pulmonary nodules. Mild patchy air trapping in both lungs on the expiration sequence, unchanged. Patchy peribronchovascular and subpleural reticulation and  ground-glass attenuation throughout both lungs with associated mild traction bronchiectasis and mild architectural distortion. There is a slight basilar gradient to these findings. Tiny focus of honeycombing in anterior right upper lobe (series 8/image 59). These findings have mildly worsened since 03/14/2016 chest CT. 1. Spectrum of findings compatible with fibrotic interstitial lung disease with a slight basilar predominance, with mild progression since 2017. 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 2. Mild patchy air trapping, unchanged, indicative of small airways disease. 3. Left main and two-vessel coronary atherosclerosis. 4. Stable dilated main pulmonary artery, suggesting chronic pulmonary arterial hypertension.   Aortic Atherosclerosis (ICD10-I70.0).     Electronically Signed   By: Ilona Sorrel M.D.   On: 02/02/2018 14:03        OV 03/15/2019  Subjective:  [name], female , DOB: [birth year] , age 44 y.o. , MRN: [id number] , ADDRESS: [address] [place] [address]   03/15/2019 -   Chief Complaint  Patient presents with   IPD (Interstitial pulmonary disease)    Breathing is doing better since being on Esbriet. Has productive cough with green phelgm. Has been on antibiotic for last 6 weeks. Had hip replacement in June.     HPI VICTORIA HENSHAW 76 y.o. 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  Started on Esbriet in March 2020.  Has progressive phenotype.   She presents for routine follow-up with her husband.  She tells me that ever since starting Esbriet in the spring 2020 she has been doing well.  She does not feel that the disease has progressed.  In fact she feels somewhat better.  She continues to use 4 L of oxygen.  She is now taking the Esbriet at 1 pill 3 times daily.  This is the  full dose of 1 pill.  Her last pulmonary function test was in early 2020.  It has been a while since she did her liver function test and she requires a repeat of that today.  She is willing to have her flu shot today.  She is  asking for prescription for emergency electric generator.  She does not know about the [employer] energy assistance program.  She has a handicap sticker.  She is interested in the ILD-PRO research registry study.    She is on Trelegy inhaler and Singulair I am not fully clear why   In June 2020 she had hip replacement for stress fracture and was on 6 weeks of Keflex.  She is slowly improving.  She uses a walker.  OV 05/17/2019  Subjective:  [name], female , DOB: 11-24-1945 , age 54 y.o. , MRN: [id number] , AD[address]ESS: [address] [place] [address]   05/17/2019 -   Chief Complaint  Patient presents with   Follow-up    Patient reports that she has had a productive cough with yellow sputum and her PCP placed her on an antibiotic.    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  Started on Esbriet in March 2020.  Has progressive phenotype. COVID negative 04/25/2019,. On 02 4L [place] since end 2019  Also hx of asthma  Also hx of chronic sinusitis - 2017 CT maxillary sinusits - DR Wilburn Cornelia  History of sleep apnea on CPAP  HPI ELBONY MCCLIMANS 76 y.o. -presents to the ILD clinic for the above issues.  She continues on pirfenidone since March 2020.  She says this is helping her.  No adverse effects.  Last liver function test was August 2020.  She needs another liver function test right now.  No nausea vomiting or diarrhea or weight loss.  She continues on 4 L of oxygen continuously 24/7.  At night she uses CPAP for her sleep apnea.  She and her husband think that she needs 6 L of oxygen at night and they are trying to increase the oxygen.  Although this is not based on data but on subjective reasoning.     New issue associated with sinus issues: Her main issue today is that for the last 3-week she has had worsening of her cough and there is associated yellow sputum.  Her COVID-19 test was -2 days ago.  This been going on for 3 weeks.  She tells me this happens to her every winter.  She is complaining of chronic postnasal drainage.  In 2017 she had a CT scan of the sinus that showed maxillary sinusitis and saw Dr. Wilburn Cornelia but since then has not followed up.  She does not have a history of sinus surgery.  She is frustrated by the cough and the yellow sputum.  Her pulmonary function test itself is stable since she started the [place].  She is interested in the ILD pro registry study.  In terms of her asthma: She is on Trelegy and Singulair.  She does not want to stop this.  There is a clinical history.  She says that if she stops this it gets worse.   In terms of her sleep apnea: She continues on CPAP with oxygen at night.    ROS    IMPRESSION: CT chest Jan  2020 1. Severe tracheobronchomalacia, more pronounced on today's scan. 2. Interval progression of basilar predominant fibrotic interstitial lung disease with particularly increased ground-glass component. No honeycombing. 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 Findings are consistent with UIP per consensus guidelines: Diagnosis of Idiopathic Pulmonary Fibrosis: An Official ATS/ERS/JRS/ALAT Clinical Practice Guideline. [place], Iss 5, [id number],  Mar 18 2017. 3. Stable dilated main pulmonary artery, suggesting chronic pulmonary arterial hypertension. 4. Left main and 3 vessel coronary atherosclerosis.   Aortic Atherosclerosis (ICD10-I70.0).     Electronically Signed   By: Ilona Sorrel M.D.   On: 08/20/2018 08:21   - per HPI  OV 04/02/2020  Subjective:  [name], female , DOB: 1946/03/01 , age 60 y.o. 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  Started on Esbriet in March 2020.  Has progressive phenotype. COVID negative 04/25/2019,. On 02 4L [place] since end 2019  - on ild pro registry  Also hx of asthma -Singulair and inhaler  Also hx of chronic sinusitis - 2017 CT maxillary sinusits - DR Wilburn Cornelia  History of sleep apnea on CPAP  Rheumatoid arthritis seen by Dr. Gavin Pound on leflunomide, Plaquenil and daily prednisone 5 mg/day.  04/02/2020 -   Chief Complaint  Patient presents with   Follow-up    pt is here to go over pft results     HPI SHANIYAH WIX 76 y.o. -presents for the above issues of interstitial lung disease in the setting of connective tissue disease.  She is on leflunomide and Plaquenil and prednisone with her rheumatologist.  She is on pirfenidone for her ILD.  I personally last saw her almost a year ago in October 2020.  After that she is seen nurse practitioner.  In the interim she says she is overall stable with the symptom score show a different story she has had significant worsening of her symptoms.  She continues to be morbidly obese.  Walking desaturation test she is desaturating to the point she is requiring 6 L to correct.  In fact early in August 2020 [place] and was extremely short of breath.  Even at rest on 3 L she was 89%.  She continues to use a CPAP.  Her husband who is here with her today would like her to lose weight in order to help her health status.  Pulmonary function test shows also a decline.  Her last CT scan of the chest was in June 2020    [place] 05/13/2020   Subjective:  [name], female , DOB: Feb 07, 1946, age 86 y.o. years. 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    - Started on Esbriet in March 2020.  Has progressive phenotype. COVID negative 04/25/2019,. On 02 4L [place] since end 2019  - on ild pro registry  Also hx of asthma -Singulair and inhaler  Also hx of chronic sinusitis - 2017 CT maxillary sinusits - DR Wilburn Cornelia  History of sleep apnea on CPAP  Rheumatoid arthritis seen by Dr. Gavin Pound on leflunomide, Plaquenil and daily prednisone 5 mg/day.  Elevated pulmonary artery pressures but PVR less than 3 in 2018: Right Heart Pressures RA (mean): 13 mmHg RV (S/EDP): 38/12 mmHg PA (S/D, mean): 43/23 (32) mmHg PCWP (mean): 16 mmHg  Ao sat: 94% PA sat: 70% RA sat: 75%  Fick  CO: 6.4 L/min Fick CI: 3.7 L/min/m^2  PVR: 2.5 [place] units     Chief Complaint  Patient presents with   Follow-up    more sob walking shorter distances.  coughing up yellow phlegm.       HPI SHADIAMOND KOSKA 76 y.o. -last visit September 2021.  At that time concern for progressive ILD.  Therefore we decided to PFTs and also high-resolution CT chest.  The high-resolution CT chest compared to last year shows stability.  The PFTs show stability within this year but progression compared to last year.  She is also lost weight following a low carbohydrate diet.  Nevertheless she feels the weight of her symptoms.  She is also using oxygen with exertion.  She feels this is getting worse.  Although her overall symptom burden seems to be improved with the weight loss.  Early morning she says there are some yellow phlegm which is stable for many many years.  This is mostly sinus.  There is no change in this.  She continues on immunosuppressants and antifibrotic.  There is no  side effects from these.  She is willing to have labs checked.  She has had a Covid vaccine including booster.  She does not know if she has antibody response yet.  She is willing to get this checked.  She is looking at options to get better.  She will participate in the ILD-pro registry visit today.  Review of the records indicate the last right eye catheterization was in 2018.  Pressures were borderline at that time.  She is willing to get this checked.  Her cardiologist is in [place].  She wants to get the procedure done in [place] and is willing to see Dr. Haroldine Laws or Dr. Aundra Dubin who are well versed with right heart catheterization locally.    Wt Readings from Last 3 Encounters:  05/13/20 202 lb 12.8 oz (92 kg)  04/02/20 207 lb 9.6 oz (94.2 kg)  02/05/20 218 lb (98.9 kg)       TECHNIQUE: CT chest sept 2021 Multidetector CT imaging of the chest was performed following the standard protocol without intravenous contrast. High resolution imaging of the lungs, as well as inspiratory and expiratory imaging, was performed.   COMPARISON:  08/16/2018, 02/02/2018, 03/14/2016, 08/27/2015   FINDINGS: Cardiovascular: Aortic atherosclerosis. Normal heart size. Three-vessel coronary artery calcifications. No pericardial effusion.   Mediastinum/Nodes: No enlarged mediastinal, hilar, or axillary lymph nodes. Thyroid gland, trachea, and esophagus demonstrate no significant findings.   Lungs/Pleura: Unchanged moderate pulmonary fibrosis in a pattern with apical to basal gradient featuring irregular peripheral interstitial opacity, septal thickening, ground-glass, and minimal tubular bronchiectasis without evidence of bronchiolectasis or honeycombing. Fibrotic findings are not significantly changed compared to immediate prior examination but significantly worsened over time on examinations dating back to 08/27/2015 occasional stable, definitively benign small pulmonary nodules, for  example a 3 mm nodule of the anterior right upper lobe (series 10, image 77). No significant air trapping on expiratory phase imaging. No pleural effusion or pneumothorax.   Upper Abdomen: No acute abnormality.   Musculoskeletal: No chest wall mass or suspicious bone lesions identified.   IMPRESSION: 1. Unchanged moderate pulmonary fibrosis in a pattern with apical to basal gradient featuring irregular peripheral interstitial opacity, septal thickening, ground-glass, and minimal tubular bronchiectasis without evidence of bronchiolectasis or honeycombing. Fibrotic findings are not significantly changed compared to immediate prior examination but significantly worsened over time on examinations dating back to occasional 08/27/2015. Findings remain consistent with a "  probable UIP" pattern by pulmonary fibrosis criteria and generally in keeping with reported history of rheumatoid arthritis and associated fibrotic interstitial lung disease. 2. Coronary artery disease.  Aortic Atherosclerosis (ICD10-I70.0).     Electronically Signed   By: Eddie Candle M.D.   On: 04/15/2020 15:35     OV 08/19/2020  Subjective:  [name], female , DOB: [birth year] , age 36 y.o. 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    - Started on Esbriet in March 2020.  Has progressive phenotype. COVID negative 04/25/2019,. On 02 4L [place] since end 2019  - on ild pro  registry  Also hx of asthma -Singulair and inhaler  Also hx of chronic sinusitis - 2017 CT maxillary sinusits - DR Wilburn Cornelia  History of sleep apnea on CPAP  Rheumatoid arthritis seen by Dr. Gavin Pound on leflunomide, Plaquenil and daily prednisone 5 mg/day.    HPI CHAUNCEY BRUNO 76 y.o. -presents with her husband.  Last seen in October 2021.  After that in January 2022 she was supposed to have a right heart catheterization because of concern of progressive ILD and her having autoimmune phenotype.  However middle of January 2022 she got sick with bronchitis.  She and her husband say that she gets this sick every year.  They do not know if it was Covid because she did not get tested.  She says primary care physician treated her with 10 days of 20 mg/day prednisone [at baseline she is on 5 mg/day of].  And also doxycycline for 10 days.  This is not helped.  Therefore she is now on amoxicillin and a second course of prednisone 20 mg/day for 10 days.  Her baseline dose is 5 mg prednisone.  She is not feeling better.  In fact her symptom scores are much worse.  Her pulse ox on room air at rest was 88%.  Previously room air at rest was fine.  She is frustrated by her symptoms.  She and her husband are frustrated by the symptom burden.  She is requesting Phenergan with codeine for cough control.     OV 09/03/2020  Subjective:  [name], female , DOB: [birth year] , age 78 y.o. , MRN: [id number] , ADDRESS: [address] [place] [address] PCP Lorene Dy, MD Patient Care Team: Lorene Dy, MD as PCP - General (Internal Medicine) End, Harrell Gave, MD as PCP - Cardiology (Cardiology) Gavin Pound, MD as Consulting Physician (Rheumatology)  This Provider for this visit: Treatment Team:  Attending Provider: Brand Males, MD  Type of visit: Telephone/Video Circumstance: COVID-19 national emergency Identification of patient YOSHIKO KELEHER with 10-29-1945  and MRN [id number] - 2 person identifier Risks: Risks, benefits, limitations of telephone visit explained. Patient understood and verbalized agreement to proceed Anyone else on call: just patient Patient location: [phone] This provider location: [place]   09/03/2020 -  No chief complaint on file.    HPI Shanae  KATERYNA GRANTHAM 76 y.o. -this telephone visit this telephone visit is to follow-up from recent exacerbation early February 2022.  She is finished her prednisone she is feeling back to baseline.  She says her sinuses are congested but she will follow-up with the ENT.  She is still waiting for a right heart catheterization.  She has follow-up later in March 2022 with me.  Oxygen use is baseline.  She had Covid IgG checked and this was normal.  She continues to use a CPAP.  She is regular with all her medications.  She took consent form for PULSE inhaled nitric oxide study and she is interested.  Recent lab review shows slightly high calcium.  She does not know that she has this problem    PFT   OV 10/15/2020  Subjective:  [name], female , DOB: 1946-03-12 , age 16 y.o. , MRN: [id number] , ADDRESS: [address] [place] [address] PCP Lorene Dy, MD Patient Care Team: Lorene Dy, MD as PCP - General (Internal Medicine) End, Harrell Gave, MD as PCP - Cardiology (Cardiology) Gavin Pound, MD as Consulting Physician (Rheumatology)  This Provider for this visit: Treatment Team:  Attending Provider: Brand Males, MD    10/15/2020 -   Chief Complaint  Patient presents with   Follow-up    She felt she was breathing deeper before pft.      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      - Started on Esbriet in March 2020.  Has progressive phenotype. COVID negative 04/25/2019,. On 02 4L [place] since end 2019   - on ild pro registry  - last CT Sept 2021  - started PULSE iNO study march  2022 via study protocol -randomized trial  Also hx of asthma -Singulair and inhaler trelegy  Also hx of chronic sinusitis - 2017 CT maxillary sinusits - DR Wilburn Cornelia  History of sleep apnea on CPAP  Rheumatoid arthritis seen by Dr. Gavin Pound on leflunomide, Plaquenil and daily prednisone 5 mg/day.  Positive Covid IgG - Feb 2022   HPI ZYKERIA LAGUARDIA 76 y.o. -presents for standard of care visit.  She is now on inhaled nitric oxide study.  The inhaled nitric oxide is being bled through her oxygen.  She has to wear 12 hours a day.  She was randomized 2 days ago.  She tells me for the last 4 days she is having sinus complaints of yellow sinus drainage and increased cough.  She feels as acute sinusitis and she will benefit from antibiotics and prednisone.  Specifically she did not mention this at the time of randomization.  She denied this at the time of randomization but now she says it was present for 4 days.  She had pulmonary function test that shows continued decline in ILD.  Her oxygen use remains the same at 4 L and with exertion she uses 5 L.  She is on Trelegy for asthma.  Her CT scan of the chest in September did not show any evidence of emphysema.  She uses CPAP for her sleep apnea.  Labs from research were reviewed by me.  Her most recent hemoglobin is 12.  Creatinine and liver function test was normal.  Calcium is not available.   Of note after starting the inhaled nitric oxide she feels she can breathe more deeper.  However his symptom score shows continued decline.    OV 04/27/2021  Subjective:  [name],  female , DOB: 05-Apr-1946 , age 7 y.o. , MRN: [id number] , ADDRESS: [address] [place] [id number] PCP Lorene Dy, MD Patient Care Team: Lorene Dy, MD as PCP - General (Internal Medicine) End, Harrell Gave, MD as PCP - Cardiology (Cardiology) Gavin Pound, MD as Consulting Physician (Rheumatology)  This Provider for this  visit: Treatment Team:  Attending Provider: Brand Males, MD    04/27/2021 -   Chief Complaint  Patient presents with   Follow-up    Pt states she has been sick for about 6 weeks. Has been on prednisone as well as abx and is still not over it. States she is coughing getting up yellow-green phlegm and also has had some wheezing. Also has had increased SOB.      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    - Started on Esbriet in March 2020.  Has progressive phenotype. COVID negative 04/25/2019,. On 02 4L [place] since end 2019   - on ild pro registry  - started PULSE iNO study march 2022 via study protocol -randomized trial -> randomized to Open Label in sept 2022  - [place] 11/09/20   -   RA = 12 RV = 52/14 PA = 55/25 (38) PCW = 19 Fick cardiac output/index = 5.8/3.1 Thermo CO/CI = 7.4/4.0 PVR = 3.3 WU (Fick) 2.6 WU (thermo) FA sat = 99% PA sat = 71%, 71% SVC sat = 76% Given relatively normal PVR may not benefit much from Tyvas - per cads    Also hx of asthma -Singulair and inhaler trelegy  - 0% eoios last checked 2019  Also hx of chronic sinusitis  - 2017 CT maxillary sinusits - DR Wilburn Cornelia  - CT sinus nov 2020 - celar  History of sleep apnea on CPAP  Rheumatoid arthritis seen by Dr. Gavin Pound on leflunomide, Plaquenil and daily prednisone 5 mg/day.  Positive Covid IgG - Feb 2022   HPI QUIANNA AVERY 76 y.o. -presents for standard of care visit to the ILD center with [name].  She presents with her husband.  She tells me that since summer 2022 she is having increased sinus drainage.  For the last 6 weeks she is got more cough.  She feels there is a frog in the throat.  She brings yellow drainage from the sinuses.  The drainage increases with Nettie pot saline wash.  Sometimes she is clearing this from the throat.  Her husband says this happens every winter.  2 years ago we did CT scan of the sinus and it was  clear.  She has been on 2 antibiotics.  She felt cephalexin and azithromycin did not help.  She prefers to have doxycycline.  She has been on 1 prednisone 10-day taper.  She feels that she wants another course of doxycycline and prednisone.  She also states that if she takes antibiotics she will want Diflucan.  Her last sinus CT was 2 years ago and her last CT chest was 1 year ago.  She also reports cough and chest tightness.  Worsening shortness of breath.  Despite subjective reporting of worsening shortness of breath subjective objective dyspnea score on the scoring scale looks the same  She continues on antifibrotic pirfenidone.  She is tolerating this well.  She is also on inhaled nitric oxide open label with the oxygen.  Her pulmonary function test shows continued decline.  Because of her symptoms we did a nitric oxide exhaled test and this was high.  Unclear if this because of inhaled nitric oxide.  Research of the study sponsor on this.    Lab Results  Component Value Date   NITRICOXIDE 139 04/27/2021   Xxxxxxxxxxxxxxxxxxxxxx  05/24/2021 -  Followup Test results - video visit   HPI BIRDENA KINGMA 76 y.o. - standard of care visit to discuss CT  and lab results  Per research team:  She rated her shortness of breath today at a 5 out of 5 today and has been since July 18th, before it was 4 out of 5. Her Borg Dyspnea score was 3 (moderate) at rest the last two visits and before that only rated it 1 (slight) back in June. On 6wmd: he walked 153 meters today compared to 120 meters on 03/29/2021. SHe is currently on Open Labvel extensio of the inhaled NO Study-she was able to walk just since 6 L.  She is now on scheduled to see research team every 4 months.   102md  - 133m start of study - 167 m I June 2022 - 120 m sept 2022 -> open labe inhaled NO rolled over July 2022 - 1543m1/7/22 - needed 6L . -> improved  Labs  - ANCA - normal - IgE and RAST allergy panel negative - PFT shows  continued decline    Overall it appears based on pulmonary function test and CT scan of the chest she is declined significantly.  She also has severe tracheobronchomalacia which has not been documented in the prior CT scan.  I did discuss the progression with her.  She indicated to me that she was worried about her progression.  She appears to be on optimal antifibrotic therapy with pirfenidone.  We reviewed her rheumatoid arthritis medication she no longer on Plaquenil.  Recent documented above.  Subsequently did discuss with Dr. AnGavin Pound We considered possibility of using Actemra which in scleroderma ILD is shown lung stabilization/modulation effect.  She will try to make a switch for this if insurance would approve.  Did indicate to patient that if she continued to decline then we would involve palliative care at home.  At this point in time we resolved that she will continue with the research protocol. The tracheobronchomalacia is the likely reason for exertional wheezing.      OV 09/16/2021  Subjective:  Patient ID: DoBarrie Dunkerfemale , DOB: 03/29/10/1947 age 76.o. , MRN: [id number] [address]: [address] [id number] RoLorene DyMD Patient Care Team: RoLorene DyMD as PCP - General (Internal Medicine) End, ChHarrell GaveMD as PCP - Cardiology (Cardiology) HaGavin PoundMD as Consulting Physician (Rheumatology)  This Provider for this visit: Treatment Team:  Attending Provider: RaBrand MalesMD    09/16/2021 -   Chief Complaint  Patient presents with   Follow-up    Pt states her breathing has become better since stopping the nitric oxide that she was doing with research. States she does have an occasional cough.     HPI DoANNETH BRUNELL89.o. -presents with her husband.  On 08/24/2021 she completely withdrew from the inhaled nitric oxide open label extension part of the study.  The coordinator did mention that to me back then.  She  is simply not interested in the study.  In fact she refused even participate in the follow-up visits without the investigational medical product.  She just wants to be done with it.  She was frustrated with the device in the  machine and the noise.  In addition she felt that the inhaled nitric oxide was making the sinus situation worse.  She believes that after going to open label extension she got worse.  Since stopping the inhaler anticoagulate her sinus complaints are better.  She feels also is that she is less short of breath.  Of note she has now had 3 doses once monthly of Actemra for rheumatoid arthritis.  Correlating with this the husband notices that she is using her cane less and she is in less pain and she is moving better. For chronic respiratory failure she uses 6 L nasal cannula continuous at rest and with exertion.  Overall she feels better.  Symptom score is below.  She still continues to participate in the ILD-Pro registry and today she has a visit for that.        OV 12/08/2021  Subjective:  [name], female , DOB: March 27, 1946 , age 74 y.o. , MRN: [id number] , ADDRESS: [address] [place] [id number] PCP Lorene Dy, MD Patient Care Team: Lorene Dy, MD as PCP - General (Internal Medicine) End, Harrell Gave, MD as PCP - Cardiology (Cardiology) Gavin Pound, MD as Consulting Physician (Rheumatology)  This Provider for this visit: Treatment Team:  Attending Provider: Brand Males, MD    12/08/2021 -   Chief Complaint  Patient presents with   Follow-up    PFT performed today.  Pt states she feels like her breathing has gotten better since last visit.     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    - [facility] Oct 2022  - Started on Esbriet in March 2020.  Has progressive phenotype. COVID negative 04/25/2019,.   - on ild pro registry  - started PULSE iNO study march 2022 via study protocol -randomized trial -> randomized to Open Label in sept 2022 -> stopped 08/24/21 due to sinus congestion and inconvieniece  - [facility] 11/09/20   -   RA = 12 RV = 52/14 PA = 55/25 (38) PCW = 19 Fick cardiac output/index = 5.8/3.1 Thermo CO/CI = 7.4/4.0 PVR = 3.3 WU (Fick) 2.6 WU (thermo) FA sat = 99% PA sat = 71%, 71% SVC sat = 76% Given relatively normal PVR may not benefit much from Tyvaso - per cads    Also hx of chronic sinusitis  - 2017 CT maxillary sinusits - DR SWilburn Cornelia - CT sinus nov 2020 and October 2022- celar  - normal ANCA panel Oct 2022  History of sleep apnea on CPAP  Rheumatoid arthritis seen by Dr. AGavin Poundon -  leflunomide,  - daily prednisone 5 mg/day.  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 - presemnt wth husband. Reports now needing 7L based on subjective needs. Taking mucinex.  Cough some more productive than useal but main thing is dyspnea  scor worse. Last [facility] over a year ago.  Taking esbriet, actemra, prednisone and arava. Uses walker. HAs Left hip pain and also left shoulder pain. Not a surgical candidate. PFT today shows some decline compared to a year ago. Explained to her that while on iNO study her PFT actually improved in the short run. She finds that difficult to fathom because she did not tolerate the OLE   Goals of care - full code   SYMPTOM SCALE - ILD 03/15/2019  05/17/2019   04/02/2020  05/13/2020   08/19/2020 covid 10/15/2020  04/27/2021 210# 09/16/2021  12/08/2021 204#  O2 use x      5L [place] pule - ino study 6L [place] . Off iNO study 7L [place] wth home concerntator  Shortness of Breath 0 -> 5 scale with 5 being worst (score 6 If unable to do)          At rest 0 0 4 0 _0 0 3  Simple tasks - showers, clothes change, eating, shaving 0 0 _1 Household (dishes, doing bed, laundry) _2 Shopping _3 Walking level at own pace _4 Walking up Stairs _5 Total (40 - 48) Dyspnea Score _6 How bad is your cough? _7 0 _8 How bad is your fatigue _9 nauea   0 0 0 0 0 0 0  vomit   0 0 0 0 0 0 0  diarrhea   _10 anxiety   _11 depression   _12 Simple office walk 185 feet x  3 laps goal with forehead probe 03/15/2019 - uses 4L [place] at home  04/02/2020   O2 used Room air Walked on 3L [place]  Number laps completed 1/2 lap ad desat to 88%   Comments about pace    Resting Pulse Ox/HR  97% and 75  Final Pulse Ox/HR  88% and 108 at 2nd laps  Desaturated </= 88%    Desaturated <= 3% points    Got Tachycardic >/= 90/min    Symptoms at end of test    Miscellaneous comments -walked with 4L [place] - uses 4L [place] at home Needed 6L [place] to correct   Results for ZEENA, STARKEL (MRN [id number]) as of 04/27/2021 11:38  Ref. Range 09/03/2015 12:45 10/01/2018 14:45 05/17/2019 08:59 10/02/2019 10:50 04/01/2020 11:24 09/11/2020 14:06 covid 10/15/2020 09:59 12/08/2020 09:03 02/02/2021 08:12 researc 03/29/2021 14:07  research 05/24/21 research 12/08/2021    FVC-Pre Latest Units: L 2.18 1.81 1.82 1.62 1.60 1.58 1.47 1.45 2.00 1.54 1.44L 1.38  FVC-%Pred-Pre Latest Units: % 84 72 74 66 65 65 63 63 82 63 60%   Results for DEREONNA, LENSING (MRN [id number]) as of 04/27/2021 11:38  Ref. Range 09/03/2015 12:45 10/01/2018 14:45 05/17/2019 08:59 10/02/2019 10:50 04/01/2020  11:24 09/11/2020 14:06 10/15/2020 09:59 12/08/2020 09:03 02/02/2021 08:12 research 03/29/2021 14:07  research 05/24/76 research 12/08/2021   DLCO unc Latest Units: ml/min/mmHg 14.82 11.94 11.28  10.42 11.37 7.59 9.05 9.05 9.22 6.95 8.17 7.13  DLCO unc % pred Latest Units: % 73 69 65 60 66 44 54 54 53 40 45%     HRCT Oct 2022   Narrative & Impression  CLINICAL DATA:  76 year old female with history of interstitial lung disease. Worsening coughing.   EXAM: CT CHEST WITHOUT CONTRAST   TECHNIQUE: Multidetector CT imaging of the chest was performed following the standard protocol without intravenous contrast. High resolution imaging of the lungs, as well as inspiratory and expiratory imaging, was performed.   COMPARISON:  Chest CT 04/15/2020.   FINDINGS: Cardiovascular: Heart size is normal. There is no significant pericardial fluid, thickening or pericardial calcification. There is aortic atherosclerosis, as well as atherosclerosis of the great vessels of the mediastinum and the coronary arteries, including calcified atherosclerotic plaque in the left main, left anterior descending, left circumflex and right coronary arteries. Calcifications of the mitral annulus.   Mediastinum/Nodes: No pathologically enlarged mediastinal or hilar lymph nodes. Esophagus is unremarkable in appearance. No axillary lymphadenopathy.   Lungs/Pleura: High-resolution images demonstrate widespread but patchy areas of ground-glass attenuation, septal thickening, scattered subpleural reticulation, thickening of the peribronchovascular interstitium, some scattered regions of mild cylindrical bronchiectasis and peripheral bronchiolectasis, but no frank honeycombing. Findings have no discernible craniocaudal gradient. Findings are progressive compared to the prior examination. Inspiratory and expiratory imaging demonstrates extensive air trapping indicative of small airways disease. As well, there is severe  collapse of the trachea and mainstem bronchi during expiration indicative of tracheobronchomalacia. No acute consolidative airspace disease. No pleural effusions. 4 mm right middle lobe pulmonary nodule (axial image 70 of series 5), previously only 2 mm. 4 mm subpleural nodule in the periphery of the right upper lobe (axial image 46 of series 5), stable compared to the prior examination. No other larger more suspicious appearing pulmonary nodules or masses are noted.   Upper Abdomen: Subcentimeter low-attenuation lesion in segment 7 of the liver, incompletely characterized on today's non-contrast CT examination, but statistically likely to represent a small cyst.   Musculoskeletal: There are no aggressive appearing lytic or blastic lesions noted in the visualized portions of the skeleton.   IMPRESSION: 1. The appearance of the lungs is compatible with progressive interstitial lung disease, with a spectrum of findings considered most compatible with an alternative diagnosis (not usual interstitial pneumonia) per current ATS guidelines. Specifically, findings are strongly favored to represent chronic hypersensitivity pneumonitis. 2. Slight interval enlargement of a 4 mm right middle lobe pulmonary nodule. This is nonspecific, but attention on repeat high-resolution chest CT is recommended in 12 months to ensure stability or regression of this finding. 3. Severe tracheobronchomalacia. 4. Aortic atherosclerosis, in addition to left main and 3 vessel coronary artery disease. Assessment for potential risk factor modification, dietary therapy or pharmacologic therapy may be warranted, if clinically indicated. 5. There are calcifications of the mitral annulus. Echocardiographic correlation for evaluation of potential valvular dysfunction may be warranted if clinically indicated.   Aortic Atherosclerosis (ICD10-I70.0).     Electronically Signed   By: Vinnie Langton M.D.   On:  05/16/2021 07:58   PFT    has a past medical history of Acute asthmatic bronchitis, Allergic rhinitis, Anxiety, Arthritis, Esophageal reflux, Hypertension, Interstitial lung disease ([place]) (dx jan 2020), Irritable bowel syndrome, Oxygen dependent, PONV (postoperative nausea and vomiting), Rheumatoid arthritis(714.0), and Sleep apnea.   reports that she has never smoked. She has never used smokeless tobacco.  Past Surgical History:  Procedure Laterality Date  c secttion  1977   2 foot fusions Left    total of 6 left foot sx   ABDOMINAL HYSTERECTOMY     complete    ANKLE FUSION  2009   left   BACK SURGERY     lower l to l 5 fused   CHOLECYSTECTOMY     RIGHT HEART CATH N/A 11/09/2020   Procedure: RIGHT HEART CATH;  Surgeon: Jolaine Artist, MD;  Location: [facility];  Service: Cardiovascular;  Laterality: N/A;   RIGHT/LEFT HEART CATH AND CORONARY ANGIOGRAPHY N/A 09/16/2016   Procedure: Right/Left Heart Cath and Coronary Angiography;  Surgeon: Nelva Bush, MD;  Location: [facility];  Service: Cardiovascular;  Laterality: N/A;   TONSILLECTOMY     TOTAL HIP ARTHROPLASTY Right 12/20/2018   Procedure: TOTAL HIP ARTHROPLASTY ANTERIOR APPROACH;  Surgeon: Paralee Cancel, MD;  Location: [facility];  Service: Orthopedics;  Laterality: Right;  70 mins   TOTAL KNEE ARTHROPLASTY Bilateral     Allergies  Allergen Reactions   Cefdinir Diarrhea   Erythromycin Base Diarrhea   Lactose Dermatitis   Lactulose Diarrhea   Mesalamine Nausea Only   Methotrexate Other (See Comments)    Felt sick   Nitrofuran Derivatives     shakiness   Other Diarrhea and Other (See Comments)    Shaking uncontrollably "lettuce only" "lettuce only"   Sulfa Antibiotics Other (See Comments)    Patient can't recall reaction    Tdap [Tetanus-Diphth-Acell Pertussis]     Shaking uncontrollably   Tetanus Toxoid, Adsorbed Other (See Comments)    Shaking uncontrollable     Immunization History   Administered Date(s) Administered   Fluad Quad(high Dose 65+) 03/15/2019   Influenza Split 03/18/2012, 03/18/2013, 04/14/2014, 04/17/2017   Influenza Whole 04/30/2008, 04/20/2009, 04/18/2011   Influenza, High Dose Seasonal PF 04/23/2015, 03/24/2016, 04/06/2018, 04/02/2020, 03/24/2021   Influenza,inj,quad, With Preservative 04/06/2018   PFIZER(Purple Top)SARS-COV-2 Vaccination 08/24/2019, 09/18/2019, 04/13/2020   Pneumococcal Conjugate-13 11/11/2013   Pneumococcal Polysaccharide-23 05/21/2018   Zoster Recombinat (Shingrix) 05/05/2017    Family History  Problem Relation Age of Onset   Heart disease Mother    Arthritis Mother    Heart attack Father    Diabetes Other        sibling   Heart attack Other        sibling     Current Outpatient Medications:    albuterol (VENTOLIN HFA) 108 (90 Base) MCG/ACT inhaler, USE 2 INHALATIONS BY MOUTH  EVERY 6 HOURS AS NEEDED, Disp: 34 g, Rfl: 1   aspirin EC 81 MG tablet, Take 81 mg by mouth daily., Disp: , Rfl:    benzonatate (TESSALON) 200 MG capsule, Take 1 capsule (200 mg total) by mouth 3 (three) times daily as needed for cough., Disp: 90 capsule, Rfl: 2   celecoxib (CELEBREX) 200 MG capsule, Take 200 mg by mouth daily., Disp: , Rfl:    cetirizine (ZYRTEC) 10 MG tablet, Take 20 mg by mouth daily., Disp: , Rfl:    Cholecalciferol (VITAMIN D3) 250 MCG (10000 UT) capsule, Take 10,000 Units by mouth 4 (four) times a week., Disp: , Rfl:    clidinium-chlordiazePOXIDE (LIBRAX) 5-2.5 MG capsule, Take 1 capsule by mouth 3 (three) times daily as needed (for IBS)., Disp: , Rfl:    colestipol (COLESTID) 1 g tablet, Take 2 g by mouth daily., Disp: , Rfl:    dicyclomine (BENTYL) 20 MG tablet, Take 20 mg by mouth 4 (four) times daily as needed for spasms.,  Disp: , Rfl:    FLUoxetine (PROZAC) 20 MG capsule, Take 20 mg by mouth daily., Disp: , Rfl:    fluticasone (FLONASE) 50 MCG/ACT nasal spray, SPRAY 2 SPRAYS INTO EACH NOSTRIL EVERY DAY, Disp: 48 mL, Rfl:  3   furosemide (LASIX) 40 MG tablet, TAKE 1 TABLET BY MOUTH EVERY DAY, Disp: 30 tablet, Rfl: 7   ipratropium (ATROVENT) 0.03 % nasal spray, Use 1-2 sprays in both nostrils twice daily., Disp: 90 mL, Rfl: 3   ipratropium-albuterol (DUONEB) 0.5-2.5 (3) MG/3ML SOLN, USE 1 VIAL IN NEBULIZER EVERY 6 HOURS AS NEEDED J45.901, Disp: 270 mL, Rfl: 11   KLOR-CON M20 20 MEQ tablet, TAKE 1 TABLET BY MOUTH TWICE A DAY, Disp: 180 tablet, Rfl: 3   leflunomide (ARAVA) 20 MG tablet, Take 20 mg by mouth daily. , Disp: , Rfl:    methocarbamol (ROBAXIN) 750 MG tablet, Take 750 mg by mouth every 6 (six) hours as needed for muscle spasms., Disp: , Rfl:    montelukast (SINGULAIR) 10 MG tablet, Take 1 tablet (10 mg total) by mouth at bedtime., Disp: 90 tablet, Rfl: 3   omeprazole (PRILOSEC) 40 MG capsule, Take 40 mg by mouth at bedtime. , Disp: , Rfl:    OXYGEN, Inhale 4 mLs into the lungs continuous. , Disp: , Rfl:    Pirfenidone (ESBRIET) 801 MG TABS, Take 801 mg by mouth 3 (three) times daily., Disp: 270 tablet, Rfl: 3   predniSONE (DELTASONE) 5 MG tablet, Take 5 mg by mouth daily with breakfast., Disp: , Rfl:    rosuvastatin (CRESTOR) 20 MG tablet, TAKE 1 TABLET BY MOUTH DAILY, Disp: 90 tablet, Rfl: 0   Tocilizumab (ACTEMRA IV), Inject into the vein every 30 (thirty) days. infusion, Disp: , Rfl:    traMADol (ULTRAM) 50 MG tablet, Take 50 mg by mouth 3 (three) times daily as needed for moderate pain. Can take up to 4 pills a day as needed., Disp: , Rfl:    traZODone (DESYREL) 50 MG tablet, Take 50 mg by mouth at bedtime., Disp: , Rfl:    TRELEGY ELLIPTA 100-62.5-25 MCG/ACT AEPB, USE 1 INHALATION BY MOUTH DAILY, Disp: 180 each, Rfl: 3   verapamil (CALAN-SR) 240 MG CR tablet, TAKE 1 TABLET BY MOUTH  DAILY, Disp: 90 tablet, Rfl: 0      Objective:   Vitals:   12/08/21 1540  BP: 136/78  Pulse: 76  SpO2: 91%  Weight: 204 lb 3.2 oz (92.6 kg)  Height: _0  (1.549 m)    Estimated body mass index is 38.58 kg/m as  calculated from the following:   Height as of this encounter: _1  (1.549 m).   Weight as of this encounter: 204 lb 3.2 oz (92.6 kg).  _2 @  Filed Weights   12/08/21 1540  Weight: 204 lb 3.2 oz (92.6 kg)     Physical Exam   General: No distress. Obese. Husband with her. Has a walker Neuro: Alert and Oriented x 3. GCS 15. Speech normal Psych: Pleasant Resp:  Barrel Chest - no.  Wheeze - no, Crackles - yes, No overt respiratory distress CVS: Normal heart sounds. 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 PEERL +. No post nasal drip        Assessment:       ICD-10-CM   1. Chronic respiratory failure with hypoxia (HCC)  J96.11 CBC with Differential/Platelet    Basic metabolic panel    Hepatic function panel  Brain natriuretic peptide    2. ILD (interstitial lung disease) (HCC)  J84.9 CBC with Differential/Platelet    Basic metabolic panel    Hepatic function panel    Brain natriuretic peptide    3. Shortness of breath  R06.02 CBC with Differential/Platelet    Basic metabolic panel    Hepatic function panel    Brain natriuretic peptide         Plan:     Patient Instructions  Chronic respiratory failure with hypoxia (HCC) Interstitial lung disease due to connective tissue disease (HCC) High risk medication use Therapeutic drug monitoring REsearch subject - Pulse iNO study - randomized 10/13/20 and rolled over open label 03/29/21 -.withdrew 08/24/21  -Progressive interstitial lung disease - clinically slightly progressive since May 2022 (1 year ago). Now using 7L [place] rest f  - Last HRCT Oct 2022  - Plan ---Continue using oxygen 7 L at rest and  more  with exertion  -Monitor pulse ox with exertion and keep it over 86% -Continue participation in ILD-pro research registry -Continue pirfenidone  - Continue RA Rx with Dr. Gavin Pound: -  Actemra, leflunomade, prednisone, flonase, celebrex - check cbc,  bmet, LFT, and BNP - Likely need repeat Right Heart Cath  - will messsage Dr Jeffie Pollock  -  if there is pulmonary hypertension - then might qualify for tyvaso - do spirometry and dlco in 2-3 months  Obstructive sleep apnea  Plan -Continue CPAP  Chrnonic persistnet sinusitis    - In control   Plan - continue regular flonase and netti pot and atrovent nasal spray snf cetrizine -  Asthma/tracheobronchomalacia -Not in flareup.    Plan  - continue trelegy scheduled  - use alb as needed   Followup - 2-3 month s ace to face -30-minute visit [name] but after spirometry and dlco  - symptoms score and pulse ox on room air at rest and walk test on 5-6L [place] at followup  - will aim for HRCT Oct 2023  ( Level 05 visit: Estb 40-54 min    visit type: on-site physical face to visit  in total care time and counseling or/and coordination of care by this undersigned MD - Dr Brand Males. This includes one or more of the following on this same day 12/08/2021: pre-charting, chart review, note writing, documentation discussion of test results, diagnostic or treatment recommendations, prognosis, risks and benefits of management options, instructions, education, compliance or risk-factor reduction. It excludes time spent by the [place] or office staff in the care of the patient. Actual time 64 min)   SIGNATURE    Dr. Brand Males, M.D., F.C.C.P,  Pulmonary and Critical Care Medicine Staff Physician, [place] Director - Interstitial Lung Disease  Program  Pulmonary [place] at [place], [place], [address]  Pager: [phone], If no answer or between  15:00h - 7:00h: call [phone] Telephone: [phone]  11:48 PM 12/08/2021

## 2021-12-08 NOTE — Patient Instructions (Addendum)
Chronic respiratory failure with hypoxia (HCC) Interstitial lung disease due to connective tissue disease (HCC) High risk medication use Therapeutic drug monitoring REsearch subject - Pulse iNO study - randomized 10/13/20 and rolled over open label 03/29/21 -.withdrew 08/24/21  -Progressive interstitial lung disease - clinically slightly progressive since May 2022 (1 year ago). Now using 7L [place] rest f  - Last HRCT Oct 2022  - Plan ---Continue using oxygen 7 L at rest and  more  with exertion  -Monitor pulse ox with exertion and keep it over 86% -Continue participation in ILD-pro research registry -Continue pirfenidone  - Continue RA Rx with Dr. Gavin Pound: -  Actemra, leflunomade, prednisone, flonase, celebrex - check cbc, bmet, LFT, and BNP - Likely need repeat Right Heart Cath  - will messsage Dr Jeffie Pollock  -  if there is pulmonary hypertension - then might qualify for tyvaso - do spirometry and dlco in 2-3 months  Obstructive sleep apnea  Plan -Continue CPAP  Chrnonic persistnet sinusitis    - In control   Plan - continue regular flonase and netti pot and atrovent nasal spray snf cetrizine -  Asthma/tracheobronchomalacia -Not in flareup.    Plan  - continue trelegy scheduled  - use alb as needed   Followup - 2-3 month s ace to face -30-minute visit [name] but after spirometry and dlco  - symptoms score and pulse ox on room air at rest and walk test on 5-6L [place] at followup  - will aim for HRCT Oct 2023

## 2021-12-08 NOTE — Telephone Encounter (Signed)
[name]  You did RHC a year ago. Did not meet WHO-3 criteria but she has declined since then . Now ion 7L [place]. So, ? Consider repeat RHC

## 2021-12-09 ENCOUNTER — Other Ambulatory Visit (INDEPENDENT_AMBULATORY_CARE_PROVIDER_SITE_OTHER): Payer: Medicare Other

## 2021-12-09 DIAGNOSIS — R0602 Shortness of breath: Secondary | ICD-10-CM | POA: Diagnosis not present

## 2021-12-09 DIAGNOSIS — J849 Interstitial pulmonary disease, unspecified: Secondary | ICD-10-CM | POA: Diagnosis not present

## 2021-12-09 DIAGNOSIS — J9611 Chronic respiratory failure with hypoxia: Secondary | ICD-10-CM

## 2021-12-09 LAB — CBC WITH DIFFERENTIAL/PLATELET
Basophils Absolute: 0 10*3/uL (ref 0.0–0.1)
Basophils Relative: 0.3 % (ref 0.0–3.0)
Eosinophils Absolute: 0.1 10*3/uL (ref 0.0–0.7)
Eosinophils Relative: 1.5 % (ref 0.0–5.0)
HCT: 35.4 % — ABNORMAL LOW (ref 36.0–46.0)
Hemoglobin: 11.2 g/dL — ABNORMAL LOW (ref 12.0–15.0)
Lymphocytes Relative: 7.4 % — ABNORMAL LOW (ref 12.0–46.0)
Lymphs Abs: 0.7 10*3/uL (ref 0.7–4.0)
MCHC: 31.6 g/dL (ref 30.0–36.0)
MCV: 101.4 fl — ABNORMAL HIGH (ref 78.0–100.0)
Monocytes Absolute: 0.7 10*3/uL (ref 0.1–1.0)
Monocytes Relative: 7.2 % (ref 3.0–12.0)
Neutro Abs: 8.1 10*3/uL — ABNORMAL HIGH (ref 1.4–7.7)
Neutrophils Relative %: 83.6 % — ABNORMAL HIGH (ref 43.0–77.0)
Platelets: 210 10*3/uL (ref 150.0–400.0)
RBC: 3.49 Mil/uL — ABNORMAL LOW (ref 3.87–5.11)
RDW: 14.9 % (ref 11.5–15.5)
WBC: 9.7 10*3/uL (ref 4.0–10.5)

## 2021-12-09 LAB — BRAIN NATRIURETIC PEPTIDE: Pro B Natriuretic peptide (BNP): 61 pg/mL (ref 0.0–100.0)

## 2021-12-09 LAB — HEPATIC FUNCTION PANEL
ALT: 11 U/L (ref 0–35)
AST: 14 U/L (ref 0–37)
Albumin: 4.1 g/dL (ref 3.5–5.2)
Alkaline Phosphatase: 67 U/L (ref 39–117)
Bilirubin, Direct: 0.1 mg/dL (ref 0.0–0.3)
Total Bilirubin: 0.5 mg/dL (ref 0.2–1.2)
Total Protein: 6.1 g/dL (ref 6.0–8.3)

## 2021-12-09 LAB — BASIC METABOLIC PANEL
BUN: 24 mg/dL — ABNORMAL HIGH (ref 6–23)
CO2: 30 mEq/L (ref 19–32)
Calcium: 10.4 mg/dL (ref 8.4–10.5)
Chloride: 104 mEq/L (ref 96–112)
Creatinine, Ser: 0.85 mg/dL (ref 0.40–1.20)
GFR: 65.97 mL/min (ref >60.00)
Glucose, Bld: 102 mg/dL — ABNORMAL HIGH (ref 70–99)
Potassium: 4.1 mEq/L (ref 3.5–5.1)
Sodium: 140 mEq/L (ref 135–145)

## 2021-12-14 ENCOUNTER — Other Ambulatory Visit (HOSPITAL_COMMUNITY): Payer: Self-pay

## 2021-12-14 DIAGNOSIS — I272 Pulmonary hypertension, unspecified: Secondary | ICD-10-CM

## 2021-12-14 NOTE — Telephone Encounter (Signed)
[place] scheduled 6/2 at 9:00am. I called patient multiple times no answer.I called patients husband he is aware of appointment but asked me to call patient tomorrow morning with instructions. I will call patient tomorrow.

## 2021-12-14 NOTE — Telephone Encounter (Signed)
Yes please do repeat RHC

## 2021-12-15 NOTE — Telephone Encounter (Signed)
Spoke with pt she is aware of procedure and we reviewed instructions.

## 2021-12-16 ENCOUNTER — Telehealth (HOSPITAL_COMMUNITY): Payer: Self-pay

## 2021-12-16 NOTE — Telephone Encounter (Signed)
Spoke to patient about procedure scheduled for tomorrow. Pt aware of nothing to eat or drink after midnight. Hold Lasix in the am. Has transportation to and from procedure.

## 2021-12-17 ENCOUNTER — Other Ambulatory Visit: Payer: Self-pay

## 2021-12-17 ENCOUNTER — Encounter (HOSPITAL_COMMUNITY): Payer: Self-pay | Admitting: Internal Medicine

## 2021-12-17 ENCOUNTER — Encounter (HOSPITAL_COMMUNITY)
Admission: RE | Disposition: A | Discharge status: Home / Self Care | Payer: Self-pay | Source: Ambulatory Visit | Attending: Internal Medicine

## 2021-12-17 ENCOUNTER — Telehealth: Payer: Self-pay | Admitting: Internal Medicine

## 2021-12-17 ENCOUNTER — Ambulatory Visit (HOSPITAL_COMMUNITY)
Admission: RE | Admit: 2021-12-17 | Discharge: 2021-12-17 | Disposition: A | Discharge status: Home / Self Care | Payer: Medicare Other | Source: Ambulatory Visit | Attending: Internal Medicine | Admitting: Internal Medicine

## 2021-12-17 DIAGNOSIS — I2510 Atherosclerotic heart disease of native coronary artery without angina pectoris: Secondary | ICD-10-CM | POA: Insufficient documentation

## 2021-12-17 DIAGNOSIS — J849 Interstitial pulmonary disease, unspecified: Secondary | ICD-10-CM | POA: Diagnosis not present

## 2021-12-17 DIAGNOSIS — R0789 Other chest pain: Secondary | ICD-10-CM | POA: Insufficient documentation

## 2021-12-17 DIAGNOSIS — I2721 Secondary pulmonary arterial hypertension: Secondary | ICD-10-CM | POA: Insufficient documentation

## 2021-12-17 DIAGNOSIS — G4733 Obstructive sleep apnea (adult) (pediatric): Secondary | ICD-10-CM | POA: Diagnosis not present

## 2021-12-17 DIAGNOSIS — I272 Pulmonary hypertension, unspecified: Secondary | ICD-10-CM

## 2021-12-17 DIAGNOSIS — J9611 Chronic respiratory failure with hypoxia: Secondary | ICD-10-CM | POA: Diagnosis not present

## 2021-12-17 HISTORY — PX: RIGHT/LEFT HEART CATH AND CORONARY ANGIOGRAPHY: CATH118266

## 2021-12-17 LAB — POCT I-STAT EG7
Acid-Base Excess: 5 mmol/L — ABNORMAL HIGH (ref 0.0–2.0)
Acid-Base Excess: 5 mmol/L — ABNORMAL HIGH (ref 0.0–2.0)
Acid-base deficit: 1 mmol/L (ref 0.0–2.0)
Bicarbonate: 25.2 mmol/L (ref 20.0–28.0)
Bicarbonate: 32 mmol/L — ABNORMAL HIGH (ref 20.0–28.0)
Bicarbonate: 31.4 mmol/L — ABNORMAL HIGH (ref 20.0–28.0)
Calcium, Ion: 0.73 mmol/L — CL (ref 1.15–1.40)
Calcium, Ion: 1.05 mmol/L — ABNORMAL LOW (ref 1.15–1.40)
Calcium, Ion: 1.07 mmol/L — ABNORMAL LOW (ref 1.15–1.40)
HCT: 24 % — ABNORMAL LOW (ref 36.0–46.0)
HCT: 33 % — ABNORMAL LOW (ref 36.0–46.0)
HCT: 32 % — ABNORMAL LOW (ref 36.0–46.0)
Hemoglobin: 11.2 g/dL — ABNORMAL LOW (ref 12.0–15.0)
Hemoglobin: 8.2 g/dL — ABNORMAL LOW (ref 12.0–15.0)
Hemoglobin: 10.9 g/dL — ABNORMAL LOW (ref 12.0–15.0)
O2 Saturation: 75 %
O2 Saturation: 79 %
O2 Saturation: 74 %
Potassium: 2.8 mmol/L — ABNORMAL LOW (ref 3.5–5.1)
Potassium: 3.8 mmol/L (ref 3.5–5.1)
Potassium: 3.8 mmol/L (ref 3.5–5.1)
Sodium: 141 mmol/L (ref 135–145)
Sodium: 148 mmol/L — ABNORMAL HIGH (ref 135–145)
Sodium: 141 mmol/L (ref 135–145)
TCO2: 27 mmol/L (ref 22–32)
TCO2: 34 mmol/L — ABNORMAL HIGH (ref 22–32)
TCO2: 33 mmol/L — ABNORMAL HIGH (ref 22–32)
pCO2, Ven: 50.4 mmHg (ref 44–60)
pCO2, Ven: 58.4 mmHg (ref 44–60)
pCO2, Ven: 57.2 mmHg (ref 44–60)
pH, Ven: 7.307 (ref 7.25–7.43)
pH, Ven: 7.347 (ref 7.25–7.43)
pH, Ven: 7.347 (ref 7.25–7.43)
pO2, Ven: 43 mmHg (ref 32–45)
pO2, Ven: 48 mmHg — ABNORMAL HIGH (ref 32–45)
pO2, Ven: 42 mmHg (ref 32–45)

## 2021-12-17 LAB — POCT I-STAT 7, (LYTES, BLD GAS, ICA,H+H)
Acid-Base Excess: 2 mmol/L (ref 0.0–2.0)
Bicarbonate: 28.7 mmol/L — ABNORMAL HIGH (ref 20.0–28.0)
Calcium, Ion: 0.97 mmol/L — ABNORMAL LOW (ref 1.15–1.40)
HCT: 30 % — ABNORMAL LOW (ref 36.0–46.0)
Hemoglobin: 10.2 g/dL — ABNORMAL LOW (ref 12.0–15.0)
O2 Saturation: 98 %
Potassium: 3.4 mmol/L — ABNORMAL LOW (ref 3.5–5.1)
Sodium: 143 mmol/L (ref 135–145)
TCO2: 30 mmol/L (ref 22–32)
pCO2 arterial: 53.2 mmHg — ABNORMAL HIGH (ref 32–48)
pH, Arterial: 7.34 — ABNORMAL LOW (ref 7.35–7.45)
pO2, Arterial: 111 mmHg — ABNORMAL HIGH (ref 83–108)

## 2021-12-17 SURGERY — RIGHT/LEFT HEART CATH AND CORONARY ANGIOGRAPHY
Anesthesia: LOCAL

## 2021-12-17 MED ORDER — LABETALOL HCL 5 MG/ML IV SOLN: 10.0000 mg | INTRAVENOUS | Status: DC | PRN

## 2021-12-17 MED ORDER — SODIUM CHLORIDE 0.9 % IV SOLN: 250.0000 mL | INTRAVENOUS | Status: DC | PRN

## 2021-12-17 MED ORDER — IOHEXOL 350 MG/ML SOLN
INTRAVENOUS | Status: DC | PRN
  Administered 2021-12-17: 40 mL

## 2021-12-17 MED ORDER — SODIUM CHLORIDE 0.9 % IV SOLN: INTRAVENOUS | Status: DC

## 2021-12-17 MED ORDER — HEPARIN SODIUM (PORCINE) 1000 UNIT/ML IJ SOLN
INTRAMUSCULAR | Status: AC
  Filled 2021-12-17: qty 10

## 2021-12-17 MED ORDER — VERAPAMIL HCL 2.5 MG/ML IV SOLN
INTRAVENOUS | Status: DC | PRN
  Administered 2021-12-17: 10 mL via INTRA_ARTERIAL

## 2021-12-17 MED ORDER — ONDANSETRON HCL 4 MG/2ML IJ SOLN: 4.0000 mg | Freq: Four times a day (QID) | INTRAMUSCULAR | Status: DC | PRN

## 2021-12-17 MED ORDER — LIDOCAINE HCL (PF) 1 % IJ SOLN
INTRAMUSCULAR | Status: DC | PRN
  Administered 2021-12-17 (×2): 2 mL via INTRADERMAL

## 2021-12-17 MED ORDER — SODIUM CHLORIDE 0.9% FLUSH: 3.0000 mL | INTRAVENOUS | Status: DC | PRN

## 2021-12-17 MED ORDER — VERAPAMIL HCL 2.5 MG/ML IV SOLN
INTRAVENOUS | Status: AC
Start: 2021-12-17 — End: ?
  Filled 2021-12-17: qty 2

## 2021-12-17 MED ORDER — SODIUM CHLORIDE 0.9 % IV SOLN: INTRAVENOUS | Status: AC

## 2021-12-17 MED ORDER — FENTANYL CITRATE (PF) 100 MCG/2ML IJ SOLN
INTRAMUSCULAR | Status: AC
  Filled 2021-12-17: qty 2

## 2021-12-17 MED ORDER — MIDAZOLAM HCL 2 MG/2ML IJ SOLN
INTRAMUSCULAR | Status: AC
  Filled 2021-12-17: qty 2

## 2021-12-17 MED ORDER — HYDRALAZINE HCL 20 MG/ML IJ SOLN: 10.0000 mg | INTRAMUSCULAR | Status: DC | PRN

## 2021-12-17 MED ORDER — SODIUM CHLORIDE 0.9% FLUSH: 3.0000 mL | Freq: Two times a day (BID) | INTRAVENOUS | Status: DC

## 2021-12-17 MED ORDER — FENTANYL CITRATE (PF) 100 MCG/2ML IJ SOLN
INTRAMUSCULAR | Status: DC | PRN
  Administered 2021-12-17: 25 ug via INTRAVENOUS

## 2021-12-17 MED ORDER — ASPIRIN 81 MG PO CHEW: 81.0000 mg | CHEWABLE_TABLET | Freq: Once | ORAL | Status: DC

## 2021-12-17 MED ORDER — HEPARIN SODIUM (PORCINE) 1000 UNIT/ML IJ SOLN
INTRAMUSCULAR | Status: DC | PRN
  Administered 2021-12-17: 4000 [IU] via INTRAVENOUS

## 2021-12-17 MED ORDER — MIDAZOLAM HCL 2 MG/2ML IJ SOLN
INTRAMUSCULAR | Status: DC | PRN
  Administered 2021-12-17: 1 mg via INTRAVENOUS

## 2021-12-17 MED ORDER — HEPARIN (PORCINE) IN NACL 1000-0.9 UT/500ML-% IV SOLN
INTRAVENOUS | Status: AC
Start: 2021-12-17 — End: ?
  Filled 2021-12-17: qty 1000

## 2021-12-17 MED ORDER — ACETAMINOPHEN 325 MG PO TABS
650.0000 mg | ORAL_TABLET | ORAL | Status: DC | PRN
Start: 2021-12-17 — End: 2021-12-17

## 2021-12-17 MED ORDER — LIDOCAINE HCL (PF) 1 % IJ SOLN
INTRAMUSCULAR | Status: AC
Start: 2021-12-17 — End: ?
  Filled 2021-12-17: qty 30

## 2021-12-17 MED ORDER — HEPARIN (PORCINE) IN NACL 1000-0.9 UT/500ML-% IV SOLN
INTRAVENOUS | Status: DC | PRN
  Administered 2021-12-17 (×2): 500 mL

## 2021-12-17 SURGICAL SUPPLY — 11 items
BAND CMPR LRG ZPHR (HEMOSTASIS) ×1
BAND ZEPHYR COMPRESS 30 LONG (HEMOSTASIS) ×1 IMPLANT
CATH 5FR JL3.5 JR4 ANG PIG MP (CATHETERS) ×1 IMPLANT
CATH BALLN WEDGE 5F 110CM (CATHETERS) ×1 IMPLANT
GLIDESHEATH SLEND SS 6F .021 (SHEATH) ×1 IMPLANT
GUIDEWIRE INQWIRE 1.5J.035X260 (WIRE) IMPLANT
INQWIRE 1.5J .035X260CM (WIRE) ×2
PACK CARDIAC CATHETERIZATION (CUSTOM PROCEDURE TRAY) ×3 IMPLANT
SHEATH GLIDE SLENDER 4/5FR (SHEATH) ×1 IMPLANT
TRANSDUCER W/STOPCOCK (MISCELLANEOUS) ×3 IMPLANT
WIRE MICROINTRODUCER 60CM (WIRE) ×1 IMPLANT

## 2021-12-17 NOTE — Progress Notes (Signed)
TR band removed from r hand. Pt with r hand neurovascular status intact. A dry sterile dressing applied to R hand. Pt. Educated about r hand not to use for the next 48 hours with verbal understanding expressed. Husband remains at bedside.

## 2021-12-17 NOTE — Telephone Encounter (Signed)
  [place] know that patient now has Pulmonary Hypertension  [place]: please set up for tyvaso - can start with nebs or DPI per patient preference.    xxxxxxxxxxxxxxxxxxxxxxxxxxxxx Narrative & Impression     Mid LAD lesion is 40% stenosed.   Prox RCA lesion is 20% stenosed.   The left ventricular ejection fraction is 55-65% by visual estimate.   Findings:   Ao = 144/66 (101) LV = 154/9 RA = 7 RV = 62/12 PA = 63/17 (36) PCW = 11 Fick cardiac output/index = 7.2/3.8 PVR = 3.5 Ao sat = 98% PA sat = 74%, 75% SVC sat = 79% PAPi = 6.5   Assessment: 1. 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 EF 60-65% 3. Mild to moderate PAH with elevated PVR   Plan/Discussion:   Will d/w [name]. Consider Tyvaso.    Glori Bickers, MD  9:07 AM

## 2021-12-17 NOTE — Discharge Instructions (Signed)

## 2021-12-17 NOTE — Progress Notes (Signed)
  Spoke with patient she has been having some episodes of chest tightness with her dyspnea.   We discussed RHC vs RHC and repeat coronary angiogram. She would like [place] to evaluate her coronaries at the time of RHC.   We discussed risks/indications. Willing to proceed.   Glori Bickers, MD  8:12 AM

## 2021-12-17 NOTE — Interval H&P Note (Signed)
History and Physical Interval Note:  12/17/2021 8:08 AM  [name]  has presented today for surgery, with the diagnosis of Pulmonary htn and chest pressure.  The various methods of treatment have been discussed with the patient and family. After consideration of risks, benefits and other options for treatment, the patient has consented to  Procedure(s): RIGHT/LEFT HEART CATH AND CORONARY ANGIOGRAPHY (N/A) and possible coronary angioplasty as a surgical intervention.  The patient's history has been reviewed, patient examined, no change in status, stable for surgery.  I have reviewed the patient's chart and labs.  Questions were answered to the patient's satisfaction.     Cyrstal Leitz

## 2021-12-20 ENCOUNTER — Telehealth: Payer: Self-pay | Admitting: Pharmacist

## 2021-12-20 NOTE — Telephone Encounter (Signed)
Front desk - pls give video visti with myself or Tammy Parrett / Derl Barrow to explain WHO-3 pulm htn . We had already done this but seems she has more questions  Thanks    SIGNATURE    Dr. Brand Males, M.D., F.C.C.P,  Pulmonary and Critical Care Medicine Staff Physician, [place] Director - Interstitial Lung Disease  Program  Medical Director - [facility] at [place], [place], [address]  NPI Number:  NPI #[id number] [facility] Number: [id number]  Pager: [phone], If no answer  -> Check AMION or Try [phone] Telephone (clinical office): [phone] Telephone (research): [phone]  5:03 PM 12/20/2021

## 2021-12-20 NOTE — Telephone Encounter (Signed)
Patient recent PH-ILD diagnosis.  Submitted a Prior Authorization request to [facility] for TYVASO DPI via CoverMyMeds. Will update once we receive a response.  Key: [id number]  Provider form for Tyvaso referral form placed in Dr. Golden Pop mailbox. Remaining forms placed in Tyvaso folder in pharmacy office pending provider forms and PA approval letter  Knox Saliva, PharmD, MPH, BCPS, CPP Clinical Pharmacist (Rheumatology and Pulmonology)

## 2021-12-20 NOTE — Telephone Encounter (Signed)
Spoke with patient regarding pulmonary hypertension diagnosis and Dr. Golden Pop recommendation for Tyvaso.  Patient would like additional information about diagnosis from [name] or APP via phone call or otherwise via office visit.  We did review goals of Tyvaso and dosing that is four times daily. She would prefer to start with DPI device. She did state she is comfortable with starting benefits investigation for Tyvaso given turnaround time of 3-4 weeks.  Knox Saliva, PharmD, MPH, BCPS, CPP Clinical Pharmacist (Rheumatology and Pulmonology)

## 2021-12-21 NOTE — Telephone Encounter (Signed)
Provider portion received and placed in Tyvaso folder

## 2021-12-21 NOTE — Telephone Encounter (Signed)
Patient is scheduled Friday, June 9th at 4:30pm with Derl Barrow, NP- nothing further needed.

## 2021-12-22 ENCOUNTER — Other Ambulatory Visit (HOSPITAL_COMMUNITY): Payer: Self-pay

## 2021-12-22 NOTE — Telephone Encounter (Signed)
Received notification from [facility] regarding a prior authorization for TYVASO DPI. Authorization has been APPROVED from 12/20/21 to 07/17/22.   Authorization # [id number] Phone # [phone]  Per test claim for titration pack, copay is $3021.25  Submitted completed referral paperwork to [facility] for TYVASO along with HRCT, right heart cath results, and most recent progress note.  Fax# [phone] Phone# [phone]  Knox Saliva, PharmD, MPH, BCPS, CPP Clinical Pharmacist (Rheumatology and Pulmonology)

## 2021-12-24 ENCOUNTER — Telehealth (INDEPENDENT_AMBULATORY_CARE_PROVIDER_SITE_OTHER): Payer: Medicare Other | Admitting: Primary Care

## 2021-12-24 DIAGNOSIS — I272 Pulmonary hypertension, unspecified: Secondary | ICD-10-CM

## 2021-12-24 NOTE — Assessment & Plan Note (Addendum)
-  PH-ILD. Patient had right heart cath with Dr. Haroldine Laws with that showed evidence of mild to moderate PAH with elevated PVR.   - Plan is to start patient on Tyvaso DPI. Continue treatment for COPD with Trelegy Ellipta and chronic respiratory failure with supplemental oxygen at 7 L continuous. - Today we reviewed diagnosis of pulmonary hypertension, causes and treatment. Reviewed new medication called Tyvaso for treatment of PAH and potential side effects to monitor for including cough, headache, throat irritation, nausea, dizziness and headache.  Instructed patient to check her blood pressure daily and notify us if less than 90/60.   - Follow-up in 1 month to assess clinical response to new medication.

## 2021-12-24 NOTE — Patient Instructions (Addendum)
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  This is due to your underlying lung disease including ILD, hypoxemia and OSA. ILD can be complicated by pulmonary hypertension and can contribute to loss of functionality.  In early stages of pulmonary fibrosis up to 15% of patients with ILD already have pulmonary hypertension.  As ILD advances frequency of pulmonary hypertension continues to rise beyond 50%.  Symptoms associated with pulmonary hypertension are increased exertional dyspnea, fatigue and cough.   [name] has recommended you start medication called Tyvaso which relaxes your blood vessels to help get oxygen to your lungs. Medication may improve your ability to exercise and do normal activities. Side effects you may experience with medication are headache, cough, throat irritation, nausea, diarrhea  You have an approved for Tyvaso dry powder inhaler, target dose is 1 puff 4 times daily  Follow-up 1 month virtual visit with Beth NP to assess response to new medication    Pulmonary Hypertension Pulmonary hypertension is a long-term (chronic) condition in which there is high blood pressure in the blood vessels of the lungs (pulmonary arteries). This condition occurs when pulmonary arteries become narrow and tight, making it harder for blood to flow through the lungs. This in turn makes the heart work harder to pump blood through the lungs, making it harder for you to breathe. Over time, pulmonary hypertension can weaken and damage the heart muscle, specifically the right side of the heart. Pulmonary hypertension is a serious condition that can be life-threatening. What are the causes? This condition may be caused by different medical conditions. It can be categorized by cause into five groups: Group 1: Pulmonary hypertension that is  caused when the arteries in the lungs become narrowed, thickened, or stiff (pulmonary arterial hypertension). This may happen with no known cause, or it may be: Passed from parent to child (hereditary). Caused by another disease, such as a connective tissue disease (including lupus or scleroderma), congenital heart disease, liver disease, or HIV (human immunodeficiency virus). Caused by certain medicines or poisons. Group 2: Pulmonary hypertension that is caused by weakness of the left chamber of the heart (left ventricle) or heart valve disease. Group 3: Pulmonary hypertension that is caused by chronic lung disease or low oxygen levels. Causes in this group include: Emphysema or chronic obstructive pulmonary disease (COPD). Untreated sleep apnea. Pulmonary fibrosis. Long-term exposure to high altitudes in certain people who may already be at higher risk for pulmonary hypertension. Group 4: Pulmonary hypertension that is caused by blood clots in the lungs. Group 5: Other causes of pulmonary hypertension, such as sickle cell anemia, sarcoidosis, abnormal growths of tissue (tumors) pressing on the pulmonary arteries, and various other diseases. What are the signs or symptoms? Symptoms of this condition include: Shortness of breath. You may notice shortness of breath with: Activity, such as walking. Minimal activity, such as getting dressed. No activity, like when you are sitting still. A cough. Sometimes, bloody mucus from the lungs may be coughed up. Tiredness. Dizziness, light-headedness, or fainting, especially with physical activity. Rapid heartbeat, or feeling your heart flutter or skip a beat (palpitations). Veins in the neck getting larger. Swelling of the lower legs, abdomen, or both. Bluish color of the lips and fingertips. Chest pain or tightness. Abdominal pain, especially in the upper abdomen. How is this diagnosed? This condition may be  diagnosed based on one or more of the  following tests: Blood tests. Imaging tests, such as: Chest X-ray. CT scan. Echocardiogram. This test uses sound waves (ultrasound) to produce an image of the heart. Ventilation-perfusion scan. This test uses radioactive material to test how well air moves and blood flows in and out of the lungs. Pulmonary function test. This test measures how much air your lungs can hold. It also tests how well air moves in and out of your lungs. 6-minute walk test. This tests how severe your condition is in relation to your activity levels. ECG (electrocardiogram). This test records the electrical impulses of the heart. Cardiac catheterization. This is a procedure in which a thin tube (catheter) is passed into the pulmonary artery and used to test the pressure in your pulmonary artery and the right side of your heart. Lung biopsy. This involves having a procedure to remove a small sample of lung tissue for testing. This may help determine an underlying cause of your pulmonary hypertension. How is this treated? There is no cure for this condition, but treatment can help to relieve symptoms and slow the progress of the condition. Treatment may include: Cardiac rehabilitation. This is a treatment program that includes exercise training, education, and counseling to help you get stronger and return to an active lifestyle. Oxygen therapy. Medicines that: Lower blood pressure. Relax (dilate) the pulmonary blood vessels. Help the heart beat more efficiently and pump more blood. Help the body get rid of extra fluid. Thin the blood in order to prevent blood clots in the lungs. Lung surgery to relieve pressure on the heart. This may be needed for severe cases that do not respond to medical treatment. Heart-lung transplant, or lung transplant. This may be done in very severe cases. Follow these instructions at home: Eating and drinking  Eat a healthy diet that includes plenty of fresh fruits and vegetables, whole  grains, and beans. Limit your salt (sodium) intake to less than 2,300 mg a day. Activity Get plenty of rest. Exercise as directed. Talk with your health care provider about what type of exercise is safe for you. Avoid sitting in hot tubs or saunas for long periods of time. Avoid high altitudes. Lifestyle Do not use any products that contain nicotine or tobacco. These products include cigarettes, chewing tobacco, and vaping devices, such as e-cigarettes. If you need help quitting, ask your health care provider. Avoid secondhand smoke. General instructions Take over-the-counter and prescription medicines only as told by your health care provider. Do not change or stop medicines without checking with your health care provider. Stay up to date on your vaccines, especially yearly flu (influenza) and pneumonia vaccines. If you are a woman of child-bearing age, avoid becoming pregnant. Talk with your health care provider about birth control. Consider ways to get support for the anxiety and stress of living with pulmonary hypertension. Talk with your health care provider about support groups and online resources. Use oxygen therapy at home as directed. Keep track of your weight. Weight gain could be a sign that your condition is getting worse. Keep all follow-up visits. This is important. Contact a health care provider if: Your cough gets worse. You have more shortness of breath than usual, or you start to have trouble doing activities that you could do before. You need to use medicines or oxygen more frequently or in higher dosages than usual. Get help right away if: You have severe shortness of breath. You have chest pain or pressure. You  cough up blood. You have swelling of your feet or legs that gets worse. You have rapid weight gain over a period of 1-2 days. Your medicines or oxygen do not provide relief. These symptoms may represent a serious problem that is an emergency. Do not wait to  see if the symptoms will go away. Get medical help right away. Call your local emergency services (911 in the [place] Do not drive yourself to the hospital. Summary Pulmonary hypertension is a long-term (chronic) condition in which there is high blood pressure in the blood vessels of the lungs (pulmonary arteries). Pulmonary hypertension is a serious condition that can be life-threatening. It can be caused by a variety of illnesses. Treatment may involve taking medicines and using oxygen therapy. Severe cases may require surgery or a transplant. This information is not intended to replace advice given to you by your health care provider. Make sure you discuss any questions you have with your health care provider. Document Revised: 05/18/2020 Document Reviewed: 05/18/2020 Elsevier Patient Education  [place].

## 2021-12-24 NOTE — Progress Notes (Signed)
Virtual Visit via Video Note  I connected with [name] on 12/24/21 at  4:30 PM EDT by a video enabled telemedicine application and verified that I am speaking with the correct person using two identifiers.  Location: Patient: Home Provider: Office   I discussed the limitations of evaluation and management by telemedicine and the availability of in person appointments. The patient expressed understanding and agreed to proceed.  History of Present Illness: 76 year old female.  Never smoked.  Past medical history significant for coronary artery disease, pulmonary hypertension, chronic heart failure with preserved ejection fraction, ILD, chronic respiratory failure with hypoxia, obstructive sleep apnea.  Patient of [name].   12/24/2021 Patient contacted today for virtual video visit to discuss pulmonary hypertension. Patient was able to connect to video but we were unable to hear her. We transitioned visit to televisit. She had right heartcath with Dr. Bettina Gavia 12/17/2021 that showed mild to moderate PAH with elevated PVR. Ef 60-65%. PA 63/17 (36). [name] recommended starting Tyvaso.  We reviewed heart cath results today with patient, causes of PAH and treatment.  We discussed new medication Tyvaso and possible side effects. She has symptoms of chronic fatigue, dyspnea with exertion.  She typically does not have a significant cough.  Some worse recently with poor air quality. She is on Trelegy Ellipta 100 mcg daily, prn ipratropium-albuterol nebulizer q 6 hours as needed and Esbriet 81 mg 3 times daily.  She is on 7 L of oxygen continuously.   Observations/Objective:  -Able to speak in full sentences; No overt resp symptoms, wheezing or cough   Assessment and Plan:  Pulmonary HTN: - PH-ILD. Patient had right heart cath with Dr. Kae Heller with that showed evidence of mild to moderate PAH with elevated PVR.   - Plan is to start patient on Tyvaso DPI. Continue treatment for COPD  with Trelegy Ellipta and chronic respiratory failure with supplemental oxygen at 7 L continuous. - Today we reviewed diagnosis of pulmonary hypertension, causes and treatment. Reviewed new medication called Tyvaso for treatment of PAH and potential side effects to monitor for including cough, headache, throat irritation, nausea, dizziness and headache.  Instructed patient to check her blood pressure daily and notify us if less than 90/60.   - Follow-up in 1 month to assess clinical response to new medication.  ILD: - Continue Esbriet 801 mg 3 times daily - Due for repeat HRCT in October 2023 - Patient follow-up in August with [name] with walk test, symptoms score and repeat Arlyce Harman with DLCO  COPD:  - Continue Trelegy 100 mcg 1 puff daily and as needed ipratropium albuterol nebulizer every 6 hours as needed for breakthrough shortness of breath or wheezing  OSA: -Patient is 100% compliant with CPAP use greater than 4 hours from 11/22/2021 - 12/21/2021.  Current pressure 5 to 15 cm H2O with residual AHI 0.3.  No changes needed today.  Continue to wear CPAP every night for minimum of 4 to 6 hours or longer.  Chronic respiratory failure with hypoxia: - Continue 7 L supplemental oxygen to maintain O2 greater than 88 to 90%  40 mins spent on case; greater than 50% on phone call with patient  Follow Up Instructions:   1 month fu   I discussed the assessment and treatment plan with the patient. The patient was provided an opportunity to ask questions and all were answered. The patient agreed with the plan and demonstrated an understanding of the instructions.   The patient was advised  to call back or seek an in-person evaluation if the symptoms worsen or if the condition fails to improve as anticipated.  I provided 50 minutes of non-face-to-face time during this encounter.   Martyn Ehrich, NP

## 2021-12-31 ENCOUNTER — Ambulatory Visit (HOSPITAL_COMMUNITY)
Admission: RE | Admit: 2021-12-31 | Discharge: 2021-12-31 | Disposition: A | Discharge status: Home / Self Care | Payer: Medicare Other | Source: Ambulatory Visit | Attending: Internal Medicine | Admitting: Internal Medicine

## 2021-12-31 ENCOUNTER — Encounter (HOSPITAL_COMMUNITY): Payer: Self-pay | Admitting: Internal Medicine

## 2021-12-31 VITALS — BP 124/84 | HR 92 | Wt 201.0 lb

## 2021-12-31 DIAGNOSIS — Z7982 Long term (current) use of aspirin: Secondary | ICD-10-CM | POA: Diagnosis not present

## 2021-12-31 DIAGNOSIS — M069 Rheumatoid arthritis, unspecified: Secondary | ICD-10-CM | POA: Insufficient documentation

## 2021-12-31 DIAGNOSIS — I2510 Atherosclerotic heart disease of native coronary artery without angina pectoris: Secondary | ICD-10-CM | POA: Diagnosis not present

## 2021-12-31 DIAGNOSIS — J8410 Pulmonary fibrosis, unspecified: Secondary | ICD-10-CM | POA: Diagnosis not present

## 2021-12-31 DIAGNOSIS — I272 Pulmonary hypertension, unspecified: Secondary | ICD-10-CM | POA: Diagnosis not present

## 2021-12-31 DIAGNOSIS — J849 Interstitial pulmonary disease, unspecified: Secondary | ICD-10-CM

## 2021-12-31 DIAGNOSIS — Z9981 Dependence on supplemental oxygen: Secondary | ICD-10-CM | POA: Insufficient documentation

## 2021-12-31 DIAGNOSIS — J9611 Chronic respiratory failure with hypoxia: Secondary | ICD-10-CM | POA: Diagnosis not present

## 2021-12-31 DIAGNOSIS — R600 Localized edema: Secondary | ICD-10-CM | POA: Diagnosis not present

## 2021-12-31 DIAGNOSIS — Z6837 Body mass index (BMI) 37.0-37.9, adult: Secondary | ICD-10-CM | POA: Diagnosis not present

## 2021-12-31 DIAGNOSIS — Z79899 Other long term (current) drug therapy: Secondary | ICD-10-CM | POA: Diagnosis not present

## 2021-12-31 DIAGNOSIS — I5032 Chronic diastolic (congestive) heart failure: Secondary | ICD-10-CM | POA: Diagnosis present

## 2021-12-31 DIAGNOSIS — J479 Bronchiectasis, uncomplicated: Secondary | ICD-10-CM | POA: Diagnosis not present

## 2021-12-31 DIAGNOSIS — I110 Hypertensive heart disease with heart failure: Secondary | ICD-10-CM | POA: Insufficient documentation

## 2021-12-31 NOTE — Progress Notes (Signed)
ADVANCED HF CLINIC NOTE  Referring Physician: Dr. Trudie Reed Primary Cardiologist: Dr. Saunders Revel Pulmonary: [name]  HPI:  [name] is a 76 year old female with RA, morbid obesity, interstitial lung disease, bronchiectasis, pulmonary fibrosis, chronic respiratory failure on home O2, diastolic HF  PFTs: 6/85/4883 FEV1 1.34 (73% predicted) FVC 1.62 (66% predicted), ratio 83 DLCO 10.42 (60% predicted).  04/01/20 FEV1 1.35 (73% predicted) FVC 1.60 (65% predicted), ratio 83 DLCO 11.37 (66% predicted).  Hi-res CT 1/20: 1. Severe tracheobronchomalacia, more pronounced on today's scan. 2. Interval progression of basilar predominant fibrotic interstitial lung disease with particularly increased ground-glass component. No honeycombing. 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 3. 3vCAD  HiRes CT 04/15/20: unchanged with moderate pulmonary fibrosis   Echo 4/21: EF 01-41% Normal diastolic function. RV normal. No significant TR Echo 10/17: EF 55-60% RV normal.   Cath 3/18: LAD 50% RCA 20%  RA (mean): 13 mmHg RV (S/EDP): 38/12 mmHg PA (S/D, mean): 43/23 (32) mmHg PCWP (mean): 16 mmHg Ao sat: 94% PA sat: 70% RA sat: 75% Fick CO: 6.4 L/min Fick CI: 3.7 L/min/m^2 PVR: 2.5 Wood units  Repeat R/L cath 12/17/21  Mid LAD 40% Prox RCA 20% EF 55-65% Ao = 144/66 (101) LV = 154/9 RA = 7 RV = 62/12 PA = 63/17 (36) PCW = 11 Fick cardiac output/index = 7.2/3.8 PVR = 3.5 Ao sat = 98% PA sat = 74%, 75% SVC sat = 79% PAPi = 6.5   She recently saw pulmonary and was prescribed Tyvaso. The medication was $3000 a month. She is applying for patient assistance program. Wears 7L O2 continuously. Easily winded with activity. No orthopnea or PND. Has chronic left ankle edema after multiple prior surgeries.  ROS:  Please see the history of present illness.   All other systems are personally reviewed and negative.  Past  Medical History:  Diagnosis Date   Acute asthmatic bronchitis    Allergic rhinitis    Anxiety    Arthritis    Esophageal reflux    Hypertension    Interstitial lung disease ([place]) dx jan 2020   Irritable bowel syndrome    Oxygen dependent    4 liters day time 6 liters at night   PONV (postoperative nausea and vomiting)    ponv likes zofran, and scopolamine patch   Rheumatoid arthritis(714.0)    Sleep apnea     Current Outpatient Medications  Medication Sig Dispense Refill   albuterol (VENTOLIN HFA) 108 (90 Base) MCG/ACT inhaler USE 2 INHALATIONS BY MOUTH  EVERY 6 HOURS AS NEEDED 34 g 1   aspirin EC 81 MG tablet Take 81 mg by mouth daily.     benzonatate (TESSALON) 200 MG capsule Take 1 capsule (200 mg total) by mouth 3 (three) times daily as needed for cough. 90 capsule 2   celecoxib (CELEBREX) 200 MG capsule Take 200 mg by mouth daily.     cetirizine (ZYRTEC) 10 MG tablet Take 20 mg by mouth daily.     Cholecalciferol (VITAMIN D3) 250 MCG (10000 UT) capsule Take 10,000 Units by mouth daily.     clidinium-chlordiazePOXIDE (LIBRAX) 5-2.5 MG capsule Take 1 capsule by mouth 3 (three) times daily as needed (for IBS).     colestipol (COLESTID) 1 g tablet Take 1 g by mouth daily.     dicyclomine (BENTYL) 20 MG tablet Take 20 mg by mouth 4 (four) times daily as needed (IBS).  FLUoxetine (PROZAC) 20 MG capsule Take 20 mg by mouth daily.     fluticasone (FLONASE) 50 MCG/ACT nasal spray SPRAY 2 SPRAYS INTO EACH NOSTRIL EVERY DAY 48 mL 3   furosemide (LASIX) 40 MG tablet TAKE 1 TABLET BY MOUTH EVERY DAY 30 tablet 7   ipratropium (ATROVENT) 0.03 % nasal spray Use 1-2 sprays in both nostrils twice daily. 90 mL 3   ipratropium-albuterol (DUONEB) 0.5-2.5 (3) MG/3ML SOLN USE 1 VIAL IN NEBULIZER EVERY 6 HOURS AS NEEDED J45.901 (Patient taking differently: 3 mLs at bedtime. USE 1 VIAL IN NEBULIZER EVERY 6 HOURS AS NEEDED J45.901) 270 mL 11   KLOR-CON M20 20 MEQ tablet TAKE 1 TABLET BY MOUTH TWICE A  DAY 180 tablet 3   leflunomide (ARAVA) 20 MG tablet Take 20 mg by mouth daily.      methocarbamol (ROBAXIN) 750 MG tablet Take 750 mg by mouth 2 (two) times daily. As needed     montelukast (SINGULAIR) 10 MG tablet Take 1 tablet (10 mg total) by mouth at bedtime. 90 tablet 3   omeprazole (PRILOSEC) 40 MG capsule Take 40 mg by mouth at bedtime.      OXYGEN Inhale 7 L into the lungs continuous.     Pirfenidone (ESBRIET) 801 MG TABS Take 801 mg by mouth 3 (three) times daily. 270 tablet 3   Polyethyl Glycol-Propyl Glycol (SYSTANE) 0.4-0.3 % GEL ophthalmic gel Place 2 application. into both eyes at bedtime.     Polyethyl Glycol-Propyl Glycol (SYSTANE) 0.4-0.3 % SOLN Place 2 drops into both eyes daily as needed (Dry eyes).     predniSONE (DELTASONE) 5 MG tablet Take 5 mg by mouth daily with breakfast.     rosuvastatin (CRESTOR) 20 MG tablet TAKE 1 TABLET BY MOUTH DAILY 90 tablet 0   Tocilizumab (ACTEMRA IV) Inject into the vein every 30 (thirty) days. infusion     traMADol (ULTRAM) 50 MG tablet Take 50 mg by mouth 3 (three) times daily as needed for moderate pain. Can take up to 4 pills a day as needed.     traZODone (DESYREL) 100 MG tablet Take 100 mg by mouth at bedtime.     TRELEGY ELLIPTA 100-62.5-25 MCG/ACT AEPB USE 1 INHALATION BY MOUTH DAILY 180 each 3   verapamil (CALAN-SR) 240 MG CR tablet TAKE 1 TABLET BY MOUTH  DAILY 90 tablet 0   No current facility-administered medications for this encounter.    Allergies  Allergen Reactions   Cefdinir Diarrhea   Erythromycin Base Diarrhea   Lactose Dermatitis   Lactulose Diarrhea   Mesalamine Nausea Only   Methotrexate Other (See Comments)    Felt sick   Nitrofuran Derivatives     shakiness   Other Diarrhea and Other (See Comments)    Shaking uncontrollably "lettuce only" "lettuce only"   Sulfa Antibiotics Other (See Comments)    Patient can't recall reaction    Tdap [Tetanus-Diphth-Acell Pertussis]     Shaking uncontrollably    Tetanus Toxoid, Adsorbed Other (See Comments)    Shaking uncontrollable     Social History   Socioeconomic History   Marital status: Married    Spouse name: Not on file   Number of children: 2   Years of education: Not on file   Highest education level: Not on file  Occupational History   Not on file  Tobacco Use   Smoking status: Never   Smokeless tobacco: Never   Tobacco comments:    positive passive tobacco smoke exposure  Vaping Use   Vaping Use: Never used  Substance and Sexual Activity   Alcohol use: Yes    Comment: glass of wine each night    Drug use: No   Sexual activity: Yes  Other Topics Concern   Not on file  Social History Narrative   Exercise - at least 2 times weekly   Caffeine - 2 cups in the morning         Social Determinants of Health   Financial Resource Strain: Not on file  Food Insecurity: Not on file  Transportation Needs: Not on file  Physical Activity: Not on file  Stress: Not on file  Social Connections: Not on file  Intimate Partner Violence: Not on file   Family History  Problem Relation Age of Onset   Heart disease Mother    Arthritis Mother    Heart attack Father    Diabetes Other        sibling   Heart attack Other        sibling    Vitals:   12/31/21 1135  BP: 124/84  Pulse: 92  SpO2: 96%  Weight: 91.2 kg (201 lb)    Wt Readings from Last 3 Encounters:  12/31/21 91.2 kg (201 lb)  12/17/21 90.3 kg (199 lb)  12/08/21 92.6 kg (204 lb 3.2 oz)   PHYSICAL EXAM: General:  Elderly female. Ambulated into clinic, on O2 HEENT: normal Neck: supple. Thick neck, no JVD. Carotids 2+ bilat; no bruits.  Cor: PMI nondisplaced. Regular rate & rhythm. No rubs, gallops or murmurs. Lungs: diminished Abdomen: obese, soft, nontender, nondistended.  Extremities: no cyanosis, clubbing, rash, L ankle edema Neuro: alert & orientedx3, cranial nerves grossly intact. moves all 4 extremities w/o difficulty. Affect pleasant   ECG: SR 94  bpm (personally reviewed)  ASSESSMENT & PLAN:  1. Dyspnea/Pulmonary HTN - Multifactorial - Reviewed chest CT, PFTs, recent cath and previous echo - Suspect major drivers are severe pulmonary fibrosis and obesity.  - Echo 4/21 EF 02-23 Normal diastolic function. Normal RV. No TR. (personally reviewed) -Recent R/L cath 6/23 with mild CAD and mild to moderate PAH.  -Starting Tyvaso   2. Pulmonary fibrosis in setting of RA - Followed by [name]. Plan as above  3. 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 - can consider PDE-5i at next visit   4. CAD, non obstructive - Repeat coronary angio in 6/23 with stable mild CAD (mLAD 40%, prox RCA 20%) - On aspirin and statin - Continue to follow with Dr. Saunders Revel.  5. Obesity - Congratulated on recent weight loss - Encouraged further efforts - Body mass index is 37.98 kg/m.  5. Chronic hypoxic respiratory failure  - Continue O2 - Plan as above   Follow-up: 6 months  Hailey Moye N, PA-C  12/31/21 4:46 PM

## 2021-12-31 NOTE — Patient Instructions (Signed)
There has been no cahnges to your medications.  Your physician recommends that you schedule a follow-up appointment in: 6 months ( December 2023) ** please call the office in October to arrange your follow up appointment **  If you have any questions or concerns before your next appointment please send [place] a message through [place] or call our office at [phone].    TO LEAVE A MESSAGE FOR THE NURSE SELECT OPTION 2, PLEASE LEAVE A MESSAGE INCLUDING: YOUR NAME DATE OF BIRTH CALL BACK NUMBER REASON FOR CALL**this is important as we prioritize the call backs  YOU WILL RECEIVE A CALL BACK THE SAME DAY AS LONG AS YOU CALL BEFORE 4:00 PM  At the [facility], you and your health needs are our priority. As part of our continuing mission to provide you with exceptional heart care, we have created designated Provider Care Teams. These Care Teams include your primary Cardiologist (physician) and Advanced Practice Providers (APPs- Physician Assistants and Nurse Practitioners) who all work together to provide you with the care you need, when you need it.   You may see any of the following providers on your designated Care Team at your next follow up: Dr Glori Bickers Dr Haynes Kerns, NP Lyda Jester, [facility] [place], [place] Audry Riles, PharmD   Please be sure to bring in all your medications bottles to every appointment.

## 2022-01-04 NOTE — Telephone Encounter (Signed)
Reached out to [facility] rep for update on patient's Tyvaso referral as portal states BIV still pending  Knox Saliva, PharmD, MPH, BCPS, CPP Clinical Pharmacist (Rheumatology and Pulmonology)

## 2022-01-08 ENCOUNTER — Other Ambulatory Visit: Payer: Self-pay | Admitting: Internal Medicine

## 2022-01-08 DIAGNOSIS — I2510 Atherosclerotic heart disease of native coronary artery without angina pectoris: Secondary | ICD-10-CM

## 2022-01-10 NOTE — Telephone Encounter (Signed)
Provider form placed in MR mailbox for signature.

## 2022-01-20 NOTE — Telephone Encounter (Signed)
Submitted Patient Assistance provider form to  [employer]  for TYVASO DPI. Will update patient when we receive a response.  Fax# [phone] Phone# [phone]  Knox Saliva, PharmD, MPH, BCPS, CPP Clinical Pharmacist (Rheumatology and Pulmonology)

## 2022-01-24 ENCOUNTER — Encounter: Payer: Medicare Other | Admitting: Primary Care

## 2022-01-24 ENCOUNTER — Telehealth: Payer: Self-pay | Admitting: Primary Care

## 2022-01-24 NOTE — Progress Notes (Signed)
 This encounter was created in error - please disregard.

## 2022-01-24 NOTE — Telephone Encounter (Signed)
Patient was scheduled for virtual follow-up visit today.  We were unable to reach patient.  We are currently waiting for patient assistance approval for Tyvaso.  Left message for patient to keep appointment with [name] in August.  Nothing further needed.

## 2022-02-03 NOTE — Telephone Encounter (Signed)
Per [facility] portal, Tyvaso DPI titration kit mailed to patient on 02/01/22. She has nursing visit scheduled for 02/04/22. Will f/u to ensure completed   , PharmD, MPH, BCPS, CPP Clinical Pharmacist (Rheumatology and Pulmonology) 

## 2022-02-08 NOTE — Telephone Encounter (Signed)
Nursing visit with [facility] nurse completed on 02/04/22 for Tyvaso DPI training. Next visit is on 02/11/22  Knox Saliva, PharmD, MPH, BCPS, CPP Clinical Pharmacist (Rheumatology and Pulmonology)

## 2022-02-14 ENCOUNTER — Telehealth: Payer: Self-pay | Admitting: Internal Medicine

## 2022-02-14 NOTE — Telephone Encounter (Signed)
Still do not have orders from [facility] at this time for this patient. Fax machine is down will need to send again. Nothing further needed

## 2022-02-21 ENCOUNTER — Telehealth: Payer: Self-pay | Admitting: Internal Medicine

## 2022-02-21 NOTE — Telephone Encounter (Signed)
Anderson Malta with [facility] still has not received [facility] plan of are for pt, it has 3 pages and all three need a signature. [phone]  Phone [phone]  Henrene Dodge to fax to main fax as well as Bailey's fax [phone]). Please advise if we have received this and have MR sign off.

## 2022-02-22 NOTE — Telephone Encounter (Signed)
Faxes have been received. MR is not back in the office until 8/14. Will have him sign once he returns. Attempted to call Anderson Malta with [facility] to let her know this info but unable to reach. Left her a detailed message letting her know of the return date for MR. Nothing further needed.

## 2022-02-22 NOTE — Telephone Encounter (Signed)
Raquel Sarna,  When you get a chance can you double check to see if you have these orders for MR. [facility] states they have faxed it like 4 different times.   Please advise

## 2022-02-23 ENCOUNTER — Encounter (INDEPENDENT_AMBULATORY_CARE_PROVIDER_SITE_OTHER): Payer: Medicare Other | Admitting: Ophthalmology

## 2022-02-23 DIAGNOSIS — M059 Rheumatoid arthritis with rheumatoid factor, unspecified: Secondary | ICD-10-CM

## 2022-02-23 DIAGNOSIS — H35372 Puckering of macula, left eye: Secondary | ICD-10-CM

## 2022-02-23 DIAGNOSIS — Z79899 Other long term (current) drug therapy: Secondary | ICD-10-CM

## 2022-02-23 DIAGNOSIS — H35033 Hypertensive retinopathy, bilateral: Secondary | ICD-10-CM

## 2022-02-23 DIAGNOSIS — I10 Essential (primary) hypertension: Secondary | ICD-10-CM

## 2022-02-23 DIAGNOSIS — Z961 Presence of intraocular lens: Secondary | ICD-10-CM

## 2022-02-23 DIAGNOSIS — H353131 Nonexudative age-related macular degeneration, bilateral, early dry stage: Secondary | ICD-10-CM

## 2022-03-01 ENCOUNTER — Ambulatory Visit: Payer: Medicare Other | Admitting: Internal Medicine

## 2022-03-03 ENCOUNTER — Other Ambulatory Visit: Payer: Self-pay | Admitting: Internal Medicine

## 2022-03-03 ENCOUNTER — Telehealth: Payer: Self-pay | Admitting: Internal Medicine

## 2022-03-03 DIAGNOSIS — I272 Pulmonary hypertension, unspecified: Secondary | ICD-10-CM

## 2022-03-03 MED ORDER — POTASSIUM CHLORIDE CRYS ER 20 MEQ PO TBCR: 20.0000 meq | EXTENDED_RELEASE_TABLET | Freq: Two times a day (BID) | ORAL | 0 refills | Status: DC

## 2022-03-03 NOTE — Telephone Encounter (Signed)
Requested Prescriptions   Signed Prescriptions Disp Refills   potassium chloride SA (KLOR-CON M20) 20 MEQ tablet 180 tablet 0    Sig: Take 1 tablet (20 mEq total) by mouth 2 (two) times daily.    Authorizing Provider: END, CHRISTOPHER    Ordering User: Britt Bottom

## 2022-03-03 NOTE — Telephone Encounter (Signed)
*  STAT* If patient is at the pharmacy, call can be transferred to refill team.   1. Which medications need to be refilled? (please list name of each medication and dose if known) potassium chloride  2. Which pharmacy/location (including street and city if local pharmacy) is medication to be sent to? [id number] [place], [place] - [address] 3. Do they need a 30 day or 90 day supply? 90 day

## 2022-03-09 ENCOUNTER — Encounter: Payer: Self-pay | Admitting: Internal Medicine

## 2022-03-09 ENCOUNTER — Ambulatory Visit (INDEPENDENT_AMBULATORY_CARE_PROVIDER_SITE_OTHER): Payer: Medicare Other | Admitting: Internal Medicine

## 2022-03-09 VITALS — BP 144/80 | HR 96 | Ht 65.5 in | Wt 204.0 lb

## 2022-03-09 DIAGNOSIS — I10 Essential (primary) hypertension: Secondary | ICD-10-CM

## 2022-03-09 DIAGNOSIS — J849 Interstitial pulmonary disease, unspecified: Secondary | ICD-10-CM | POA: Diagnosis not present

## 2022-03-09 DIAGNOSIS — I2510 Atherosclerotic heart disease of native coronary artery without angina pectoris: Secondary | ICD-10-CM | POA: Diagnosis not present

## 2022-03-09 DIAGNOSIS — I272 Pulmonary hypertension, unspecified: Secondary | ICD-10-CM | POA: Diagnosis not present

## 2022-03-09 MED ORDER — ROSUVASTATIN CALCIUM 20 MG PO TABS: 20.0000 mg | ORAL_TABLET | Freq: Every day | ORAL | 0 refills | Status: DC

## 2022-03-09 MED ORDER — VERAPAMIL HCL ER 300 MG PO CP24
300.0000 mg | ORAL_CAPSULE | Freq: Every day | ORAL | 1 refills | Status: DC
Start: 2022-03-09 — End: 2022-08-29

## 2022-03-09 NOTE — Progress Notes (Signed)
[facility] Note  03/16/2022     CHIEF COMPLAINT Patient presents for Retina Follow Up     HISTORY OF PRESENT ILLNESS: [name] is a 76 y.o. female who presents to the clinic today for:  HPI     Retina Follow Up   Patient presents with  Dry AMD.  In both eyes.  This started years ago.  Duration of 1 year.  I, the attending physician,  performed the HPI with the patient and updated documentation appropriately.        Comments   Patient feels that at times she can't see as clear. She states that she sees a lot of floaters.       Last edited by Bernarda Caffey, MD on 03/16/2022 11:46 AM.    Pt reports that she has not been taking AREDS 2 because she could not tolerate Preservation -- caused GI issues.   Referring physician: Lorene Dy, MD [address] [place],  [place] [address]  HISTORICAL INFORMATION:   Selected notes from the MEDICAL RECORD NUMBER Referred by Dr. Truman Hayward:  Ocular Hx- PMH-    CURRENT MEDICATIONS: Current Outpatient Medications (Ophthalmic Drugs)  Medication Sig   Polyethyl Glycol-Propyl Glycol (SYSTANE) 0.4-0.3 % GEL ophthalmic gel Place 2 application. into both eyes at bedtime.   Polyethyl Glycol-Propyl Glycol (SYSTANE) 0.4-0.3 % SOLN Place 2 drops into both eyes daily as needed (Dry eyes).   No current facility-administered medications for this visit. (Ophthalmic Drugs)   Current Outpatient Medications (Other)  Medication Sig   albuterol (VENTOLIN HFA) 108 (90 Base) MCG/ACT inhaler USE 2 INHALATIONS BY MOUTH  EVERY 6 HOURS AS NEEDED   aspirin EC 81 MG tablet Take 81 mg by mouth daily.   benzonatate (TESSALON) 200 MG capsule Take 1 capsule (200 mg total) by mouth 3 (three) times daily as needed for cough.   celecoxib (CELEBREX) 200 MG capsule Take 200 mg by mouth daily.   cetirizine (ZYRTEC) 10 MG tablet Take 20 mg by mouth daily.   Cholecalciferol (VITAMIN D3) 250 MCG (10000 UT) capsule Take 10,000 Units by  mouth daily.   clidinium-chlordiazePOXIDE (LIBRAX) 5-2.5 MG capsule Take 1 capsule by mouth 3 (three) times daily as needed (for IBS).   colestipol (COLESTID) 1 g tablet Take 1 g by mouth daily.   dicyclomine (BENTYL) 20 MG tablet Take 20 mg by mouth 4 (four) times daily as needed (IBS).   FLUoxetine (PROZAC) 20 MG capsule Take 20 mg by mouth daily.   fluticasone (FLONASE) 50 MCG/ACT nasal spray SPRAY 2 SPRAYS INTO EACH NOSTRIL EVERY DAY   furosemide (LASIX) 40 MG tablet TAKE 1 TABLET BY MOUTH DAILY   ipratropium (ATROVENT) 0.03 % nasal spray Use 1-2 sprays in both nostrils twice daily.   ipratropium-albuterol (DUONEB) 0.5-2.5 (3) MG/3ML SOLN USE 1 VIAL IN NEBULIZER EVERY 6 HOURS AS NEEDED J45.901   leflunomide (ARAVA) 20 MG tablet Take 20 mg by mouth daily.    methocarbamol (ROBAXIN) 750 MG tablet Take 750 mg by mouth 2 (two) times daily. As needed   montelukast (SINGULAIR) 10 MG tablet Take 1 tablet (10 mg total) by mouth at bedtime.   omeprazole (PRILOSEC) 40 MG capsule Take 40 mg by mouth at bedtime.    OXYGEN Inhale 7 L into the lungs continuous.   Pirfenidone (ESBRIET) 801 MG TABS Take 801 mg by mouth 3 (three) times daily.   potassium chloride SA (KLOR-CON M20) 20 MEQ tablet Take 1 tablet (  20 mEq total) by mouth 2 (two) times daily.   predniSONE (DELTASONE) 5 MG tablet Take 5 mg by mouth daily with breakfast.   rosuvastatin (CRESTOR) 20 MG tablet Take 1 tablet (20 mg total) by mouth daily.   Tocilizumab (ACTEMRA IV) Inject into the vein every 30 (thirty) days. infusion   traMADol (ULTRAM) 50 MG tablet Take 50 mg by mouth 3 (three) times daily as needed for moderate pain. Can take up to 4 pills a day as needed.   traZODone (DESYREL) 100 MG tablet Take 100 mg by mouth at bedtime.   TRELEGY ELLIPTA 100-62.5-25 MCG/ACT AEPB USE 1 INHALATION BY MOUTH DAILY   Treprostinil (TYVASO DPI TITRATION KIT) 16 & 32 & 48 MCG POWD Inhale into the lungs in the morning, at noon, in the evening, and at  bedtime.   Verapamil HCl CR 300 MG CP24 Take 1 capsule (300 mg total) by mouth daily.   No current facility-administered medications for this visit. (Other)   REVIEW OF SYSTEMS: ROS   Positive for: Gastrointestinal, Musculoskeletal, Cardiovascular, Eyes, Respiratory, Psychiatric Negative for: Constitutional, Neurological, Skin, Genitourinary, HENT, Endocrine, Allergic/Imm, Heme/Lymph Last edited by Annie Paras, COT on 03/16/2022  9:24 AM.     ALLERGIES Allergies  Allergen Reactions   Cefdinir Diarrhea   Erythromycin Base Diarrhea   Lactose Dermatitis   Lactulose Diarrhea   Mesalamine Nausea Only   Methotrexate Other (See Comments)    Felt sick   Nitrofuran Derivatives     shakiness   Other Diarrhea and Other (See Comments)    Shaking uncontrollably "lettuce only" "lettuce only"   Sulfa Antibiotics Other (See Comments)    Patient can't recall reaction    Tdap [Tetanus-Diphth-Acell Pertussis]     Shaking uncontrollably   Tetanus Toxoid, Adsorbed Other (See Comments)    Shaking uncontrollable    PAST MEDICAL HISTORY Past Medical History:  Diagnosis Date   Acute asthmatic bronchitis    Allergic rhinitis    Anxiety    Arthritis    Esophageal reflux    Hypertension    Interstitial lung disease ([place]) dx jan 2020   Irritable bowel syndrome    Oxygen dependent    4 liters day time 6 liters at night   PONV (postoperative nausea and vomiting)    ponv likes zofran, and scopolamine patch   Pulmonary hypertension ([place])    Rheumatoid arthritis(714.0)    Sleep apnea    Past Surgical History:  Procedure Laterality Date    c secttion  1977   2 foot fusions Left    total of 6 left foot sx   ABDOMINAL HYSTERECTOMY     complete    ANKLE FUSION  2009   left   BACK SURGERY     lower l to l 5 fused   CHOLECYSTECTOMY     RIGHT HEART CATH N/A 11/09/2020   Procedure: RIGHT HEART CATH;  Surgeon: Jolaine Artist, MD;  Location: [facility];  Service:  Cardiovascular;  Laterality: N/A;   RIGHT/LEFT HEART CATH AND CORONARY ANGIOGRAPHY N/A 09/16/2016   Procedure: Right/Left Heart Cath and Coronary Angiography;  Surgeon: Nelva Bush, MD;  Location: [facility];  Service: Cardiovascular;  Laterality: N/A;   RIGHT/LEFT HEART CATH AND CORONARY ANGIOGRAPHY N/A 12/17/2021   Procedure: RIGHT/LEFT HEART CATH AND CORONARY ANGIOGRAPHY;  Surgeon: Jolaine Artist, MD;  Location: [facility];  Service: Cardiovascular;  Laterality: N/A;   TONSILLECTOMY     TOTAL HIP ARTHROPLASTY  Right 12/20/2018   Procedure: TOTAL HIP ARTHROPLASTY ANTERIOR APPROACH;  Surgeon: Paralee Cancel, MD;  Location: [facility];  Service: Orthopedics;  Laterality: Right;  70 mins   TOTAL KNEE ARTHROPLASTY Bilateral    FAMILY HISTORY Family History  Problem Relation Age of Onset   Heart disease Mother    Arthritis Mother    Heart attack Father    Diabetes Other        sibling   Heart attack Other        sibling   SOCIAL HISTORY Social History   Tobacco Use   Smoking status: Never   Smokeless tobacco: Never   Tobacco comments:    positive passive tobacco smoke exposure  Vaping Use   Vaping Use: Never used  Substance Use Topics   Alcohol use: Not Currently    Alcohol/week: 7.0 standard drinks of alcohol    Types: 7 Standard drinks or equivalent per week    Comment: 1 alcohol drink each night   Drug use: No       OPHTHALMIC EXAM:  Base Eye Exam     Visual Acuity (Snellen - Linear)       Right Left   Dist [place] 20/30 20/30   [place]  J1+  Monovision OD distance, OS near        Tonometry (Tonopen, 9:29 AM)       Right Left   Pressure 15 13         Pupils       Dark Light Shape React APD   Right 3 2 Round Brisk None   Left 3 2 Round Brisk None         Visual Fields       Left Right    Full Full         Extraocular Movement       Right Left    Full, Ortho Full, Ortho         Neuro/Psych     Oriented x3: Yes    Mood/Affect: Normal         Dilation     Both eyes: 1.0% Mydriacyl, 2.5% Phenylephrine @ 9:25 AM           Slit Lamp and Fundus Exam     Slit Lamp Exam       Right Left   Lids/Lashes Normal Normal   Conjunctiva/Sclera Inferior scleral show Inferior scleral show, Pinguecula   Cornea arcus, 3+ Punctate epithelial erosions, Debris in tear film arcus, 3+ Punctate epithelial erosions, Debris in tear film, cataract wounds   Anterior Chamber Deep and clear Deep and clear   Iris Round and dilated Round and dilated, focal atrophy 0430   Lens PC IOL in good position with mild superior displacement PC IOL in good position with 1-2+ PCO   Anterior Vitreous Vitreous syneresis, Posterior vitreous detachment, vitreous condensations Vitreous syneresis, Posterior vitreous detachment         Fundus Exam       Right Left   Disc Mild Pallor, Sharp rim, Compact Mild Pallor, Sharp rim   C/D Ratio 0.2 0.2   Macula Flat, Blunted foveal reflex, RPE mottling and clumping, fine Drusen, No heme or edema Flat, Blunted foveal reflex, RPE mottling and clumping, fine Drusen, mild ERM, No heme or edema   Vessels attenuated, mild AV crossing changes, mild tortuousity attenuated, mild AV crossing changes, mild tortuousity   Periphery Attached, No heme Attached, No heme, mild Reticular degeneration  IMAGING AND PROCEDURES  Imaging and Procedures for 03/16/2022  OCT, Retina - OU - Both Eyes       Right Eye Quality was good. Central Foveal Thickness: 285. Progression has been stable. Findings include normal foveal contour, no IRF, no SRF, retinal drusen (Fine drusen, no ORA, no Plaquenil toxicity).   Left Eye Quality was good. Central Foveal Thickness: 305. Progression has been stable. Findings include normal foveal contour, no IRF, no SRF, retinal drusen , epiretinal membrane (Mild ERM, Fine drusen, no ORA, no Plaquenil toxicity).   Notes *Images captured and stored on  drive  Diagnosis / Impression:  NFP, no IRF/SRF OU Fine drusen, no ORA, no Plaquenil toxicity OU Mild ERM OS  Clinical management:  See below  Abbreviations: NFP - Normal foveal profile. CME - cystoid macular edema. PED - pigment epithelial detachment. IRF - intraretinal fluid. SRF - subretinal fluid. EZ - ellipsoid zone. ERM - epiretinal membrane. ORA - outer retinal atrophy. ORT - outer retinal tubulation. SRHM - subretinal hyper-reflective material. IRHM - intraretinal hyper-reflective material            ASSESSMENT/PLAN:   ICD-10-CM   1. Early dry stage nonexudative age-related macular degeneration of both eyes  H35.3131 OCT, Retina - OU - Both Eyes    2. Rheumatoid arthritis with positive rheumatoid factor, involving unspecified site ([place])  M05.9     3. Long-term use of Plaquenil  Z79.899     4. Epiretinal membrane (ERM) of left eye  H35.372     5. Essential hypertension  I10     6. Hypertensive retinopathy of both eyes  H35.033     7. Pseudophakia, both eyes  Z96.1      1,2. Age related macular degeneration, non-exudative, both eyes  - early stage w/ fine drusen OU -- stable -The incidence, anatomy, and pathology of dry AMD, risk of progression, and the AREDS and AREDS 2 study including smoking risks discussed with patient. Patient discontinued AREDS due to upsetting the stomach and hurting all over.  - Recommend amsler grid monitoring  - pt reports intolerance to Preservision due to GI upset -- recommend trying different brand of AREDS 2 supplement  - f/u 1 yr - DFE, OCT  3. Rheumatoid Arthritis with Plaquenil Use  - no retinal toxicity noted on exam or OCT today  - pt was taking 400 mg daily for ~4 years.   - pt reports self-d/c'd plaquenil use in June 2022 and doing well from an RA standpoint  - pt wt is 92 kg  - 400/92 = 4.35 mg/kg/day  - the AAO recommends daily dosing of < 5.0 mg/kg/day for HCQ  - discussed mechanism of retinal toxicity and association  with long-term use (>5 yrs)  4. Epiretinal membrane, OS - The natural history, anatomy, potential for loss of vision, and treatment options including vitrectomy techniques and the complications of endophthalmitis, retinal detachment, vitreous hemorrhage, cataract progression and permanent vision loss discussed with the patient. - mild ERM - BCVA 20/20 - asymptomatic, no metamorphopsia - no indication for surgery at this time - continue to monitor  5.6. Hypertensive retinopathy OU - discussed importance of tight BP control - continue to monitor  7. Pseudophakia OU  - s/p CE/IOL (Dr. Dolores Lory)  - IOL in good position, doing well - continue to monitor  Ophthalmic Meds Ordered this visit:  No orders of the defined types were placed in this encounter.  Return in about 1 year (around 03/17/2023) for f/u for ARMD  OU, DFE, OCT.  There are no Patient Instructions on file for this visit.   Explained the diagnoses, plan, and follow up with the patient and they expressed understanding.  Patient expressed understanding of the importance of proper follow up care.   This document serves as a record of services personally performed by Gardiner Sleeper, MD, PhD. It was created on their behalf by Orvan Falconer, an ophthalmic technician. The creation of this record is the provider's dictation and/or activities during the visit.    Electronically signed by: Orvan Falconer, OA, 03/16/22  11:46 AM  This document serves as a record of services personally performed by Gardiner Sleeper, MD, PhD. It was created on their behalf by Renaldo Reel, COT an ophthalmic technician. The creation of this record is the provider's dictation and/or activities during the visit.    Electronically signed by:  Renaldo Reel, COT  03/16/22 11:46 AM  Gardiner Sleeper, M.D., Ph.D. Diseases & Surgery of the Retina and Vitreous [facility]  I have reviewed the above documentation for accuracy  and completeness, and I agree with the above. Gardiner Sleeper, M.D., Ph.D. 03/16/22 11:50 AM   Abbreviations: M myopia (nearsighted); A astigmatism; H hyperopia (farsighted); P presbyopia; Mrx spectacle prescription;  CTL contact lenses; OD right eye; OS left eye; OU both eyes  XT exotropia; ET esotropia; PEK punctate epithelial keratitis; PEE punctate epithelial erosions; DES dry eye syndrome; MGD meibomian gland dysfunction; ATs artificial tears; PFAT's preservative free artificial tears; [place] nuclear sclerotic cataract; PSC posterior subcapsular cataract; ERM epi-retinal membrane; PVD posterior vitreous detachment; RD retinal detachment; DM diabetes mellitus; DR diabetic retinopathy; NPDR non-proliferative diabetic retinopathy; PDR proliferative diabetic retinopathy; CSME clinically significant macular edema; DME diabetic macular edema; dbh dot blot hemorrhages; CWS cotton wool spot; POAG primary open angle glaucoma; C/D cup-to-disc ratio; HVF humphrey visual field; GVF goldmann visual field; OCT optical coherence tomography; IOP intraocular pressure; BRVO Branch retinal vein occlusion; CRVO central retinal vein occlusion; CRAO central retinal artery occlusion; BRAO branch retinal artery occlusion; RT retinal tear; SB scleral buckle; PPV pars plana vitrectomy; VH Vitreous hemorrhage; PRP panretinal laser photocoagulation; IVK intravitreal kenalog; VMT vitreomacular traction; MH Macular hole;  NVD neovascularization of the disc; NVE neovascularization elsewhere; AREDS age related eye disease study; ARMD age related macular degeneration; POAG primary open angle glaucoma; EBMD epithelial/anterior basement membrane dystrophy; ACIOL anterior chamber intraocular lens; IOL intraocular lens; PCIOL posterior chamber intraocular lens; Phaco/IOL phacoemulsification with intraocular lens placement; [place] photorefractive keratectomy; LASIK laser assisted in situ keratomileusis; HTN hypertension; DM diabetes mellitus; COPD  chronic obstructive pulmonary disease

## 2022-03-09 NOTE — Progress Notes (Signed)
Follow-up Outpatient Visit Date: 03/09/2022  Primary Care Provider: Lorene Dy, MD [address] [place] [address]  Chief Complaint: Follow-up nonobstructive coronary artery disease and pulmonary hypertension  HPI:  Ms. [name] is a 76 y.o. female with history of nonobstructive coronary artery disease, asthmatic bronchitis, interstitial lung disease, GERD, rheumatoid arthritis, CVA, prediabetes, IBS, and allergic rhinitis, who presents for follow-up of coronary artery disease and shortness of breath.  I last saw her in February, at which time Ms. Brissette continued to have significant dyspnea.  She was using 5-6 L of supplemental oxygen around-the-clock.  Due to worsening leg swelling in the setting of increased sodium intake, we agreed to temporarily increase furosemide to 40 mg twice daily until edema resolved.  She was seen in follow-up by Dr. Haroldine Laws in June following right and left heart catheterization earlier in the month.  Nonobstructive CAD was again shown.  Hemodynamics were consistent with moderate pulmonary hypertension with mean PAP of 36, PCWP 11, and Fick CO/CI of 7.2/3.8.  She had just been prescribed inhaled treprostinil but was awaiting medication assistance.  Today, Ms. Gosser reports that her breathing is a little bit better since starting Tyvaso.  She is tolerating the medication well.  Chronic left ankle edema is stable.  She has experienced more shoulder pains recently, which she attributes to moving her head awkwardly while getting her hair washed.  She has had problems with her left shoulder in the past and is planning to see orthopedics again for further evaluation.  She has not had any chest pain, palpitations, or lightheadedness.  Home oxygen saturations are typically in the mid 90s using 7 L of supplemental oxygen.  She does not check her blood pressure regularly at  home.  --------------------------------------------------------------------------------------------------  Past Medical History:  Diagnosis Date   Acute asthmatic bronchitis    Allergic rhinitis    Anxiety    Arthritis    Esophageal reflux    Hypertension    Interstitial lung disease ([place]) dx jan 2020   Irritable bowel syndrome    Oxygen dependent    4 liters day time 6 liters at night   PONV (postoperative nausea and vomiting)    ponv likes zofran, and scopolamine patch   Pulmonary hypertension ([place])    Rheumatoid arthritis(714.0)    Sleep apnea    Past Surgical History:  Procedure Laterality Date    c secttion  1977   2 foot fusions Left    total of 6 left foot sx   ABDOMINAL HYSTERECTOMY     complete    ANKLE FUSION  2009   left   BACK SURGERY     lower l to l 5 fused   CHOLECYSTECTOMY     RIGHT HEART CATH N/A 11/09/2020   Procedure: RIGHT HEART CATH;  Surgeon: Jolaine Artist, MD;  Location: [facility];  Service: Cardiovascular;  Laterality: N/A;   RIGHT/LEFT HEART CATH AND CORONARY ANGIOGRAPHY N/A 09/16/2016   Procedure: Right/Left Heart Cath and Coronary Angiography;  Surgeon: Nelva Bush, MD;  Location: [facility];  Service: Cardiovascular;  Laterality: N/A;   RIGHT/LEFT HEART CATH AND CORONARY ANGIOGRAPHY N/A 12/17/2021   Procedure: RIGHT/LEFT HEART CATH AND CORONARY ANGIOGRAPHY;  Surgeon: Jolaine Artist, MD;  Location: [facility];  Service: Cardiovascular;  Laterality: N/A;   TONSILLECTOMY     TOTAL HIP ARTHROPLASTY Right 12/20/2018   Procedure: TOTAL HIP ARTHROPLASTY ANTERIOR APPROACH;  Surgeon: Paralee Cancel, MD;  Location: [facility];  Service: Orthopedics;  Laterality: Right;  70 mins   TOTAL KNEE ARTHROPLASTY Bilateral     Current Meds  Medication Sig   albuterol (VENTOLIN HFA) 108 (90 Base) MCG/ACT inhaler USE 2 INHALATIONS BY MOUTH  EVERY 6 HOURS AS NEEDED   aspirin EC 81 MG tablet Take 81 mg by mouth daily.   benzonatate  (TESSALON) 200 MG capsule Take 1 capsule (200 mg total) by mouth 3 (three) times daily as needed for cough.   celecoxib (CELEBREX) 200 MG capsule Take 200 mg by mouth daily.   cetirizine (ZYRTEC) 10 MG tablet Take 20 mg by mouth daily.   Cholecalciferol (VITAMIN D3) 250 MCG (10000 UT) capsule Take 10,000 Units by mouth daily.   clidinium-chlordiazePOXIDE (LIBRAX) 5-2.5 MG capsule Take 1 capsule by mouth 3 (three) times daily as needed (for IBS).   colestipol (COLESTID) 1 g tablet Take 1 g by mouth daily.   dicyclomine (BENTYL) 20 MG tablet Take 20 mg by mouth 4 (four) times daily as needed (IBS).   FLUoxetine (PROZAC) 20 MG capsule Take 20 mg by mouth daily.   fluticasone (FLONASE) 50 MCG/ACT nasal spray SPRAY 2 SPRAYS INTO EACH NOSTRIL EVERY DAY   furosemide (LASIX) 40 MG tablet TAKE 1 TABLET BY MOUTH DAILY   ipratropium (ATROVENT) 0.03 % nasal spray Use 1-2 sprays in both nostrils twice daily.   ipratropium-albuterol (DUONEB) 0.5-2.5 (3) MG/3ML SOLN USE 1 VIAL IN NEBULIZER EVERY 6 HOURS AS NEEDED J45.901   leflunomide (ARAVA) 20 MG tablet Take 20 mg by mouth daily.    methocarbamol (ROBAXIN) 750 MG tablet Take 750 mg by mouth 2 (two) times daily. As needed   montelukast (SINGULAIR) 10 MG tablet Take 1 tablet (10 mg total) by mouth at bedtime.   omeprazole (PRILOSEC) 40 MG capsule Take 40 mg by mouth at bedtime.    OXYGEN Inhale 7 L into the lungs continuous.   Pirfenidone (ESBRIET) 801 MG TABS Take 801 mg by mouth 3 (three) times daily.   Polyethyl Glycol-Propyl Glycol (SYSTANE) 0.4-0.3 % GEL ophthalmic gel Place 2 application. into both eyes at bedtime.   Polyethyl Glycol-Propyl Glycol (SYSTANE) 0.4-0.3 % SOLN Place 2 drops into both eyes daily as needed (Dry eyes).   potassium chloride SA (KLOR-CON M20) 20 MEQ tablet Take 1 tablet (20 mEq total) by mouth 2 (two) times daily.   predniSONE (DELTASONE) 5 MG tablet Take 5 mg by mouth daily with breakfast.   rosuvastatin (CRESTOR) 20 MG tablet  TAKE 1 TABLET BY MOUTH DAILY   Tocilizumab (ACTEMRA IV) Inject into the vein every 30 (thirty) days. infusion   traMADol (ULTRAM) 50 MG tablet Take 50 mg by mouth 3 (three) times daily as needed for moderate pain. Can take up to 4 pills a day as needed.   traZODone (DESYREL) 100 MG tablet Take 100 mg by mouth at bedtime.   TRELEGY ELLIPTA 100-62.5-25 MCG/ACT AEPB USE 1 INHALATION BY MOUTH DAILY   Treprostinil (TYVASO DPI TITRATION KIT) 16 & 32 & 48 MCG POWD Inhale into the lungs in the morning, at noon, in the evening, and at bedtime.   verapamil (CALAN-SR) 240 MG CR tablet TAKE 1 TABLET BY MOUTH DAILY    Allergies: Cefdinir; Erythromycin base; Lactose; Lactulose; Mesalamine; Methotrexate; Nitrofuran derivatives; Other; Sulfa antibiotics; Tdap [tetanus-diphth-acell pertussis]; and Tetanus toxoid, adsorbed  Social History   Tobacco Use   Smoking status: Never   Smokeless tobacco: Never   Tobacco comments:    positive passive tobacco smoke  exposure  Vaping Use   Vaping Use: Never used  Substance Use Topics   Alcohol use: Not Currently    Alcohol/week: 7.0 standard drinks of alcohol    Types: 7 Standard drinks or equivalent per week    Comment: 1 alcohol drink each night   Drug use: No    Family History  Problem Relation Age of Onset   Heart disease Mother    Arthritis Mother    Heart attack Father    Diabetes Other        sibling   Heart attack Other        sibling    Review of Systems: A 12-system review of systems was performed and was negative except as noted in the HPI.  --------------------------------------------------------------------------------------------------  Physical Exam: BP (!) 144/80 (BP Location: Right Arm, Patient Position: Sitting, Cuff Size: Normal)   Pulse 96   Ht 5' 5.5" (1.664 m)   Wt 204 lb (92.5 kg)   SpO2 94% Comment: on 7L O2  BMI 33.43 kg/m  Repeat BP 142/80  General:  NAD.  She is accompanied by her husband. Neck: No JVD or  HJR. Lungs: Mildly diminished breath sounds throughout without wheezes or crackles. Heart: Regular rate and rhythm without murmurs, rubs, or gallops. Abdomen: Soft, nontender, nondistended. Extremities: 1+ left ankle edema.  EKG (12/31/2021): Normal sinus rhythm with poor R wave progression.  Lab Results  Component Value Date   WBC 9.7 12/09/2021   HGB 10.9 (L) 12/17/2021   HCT 32.0 (L) 12/17/2021   MCV 101.4 (H) 12/09/2021   PLT 210.0 12/09/2021    Lab Results  Component Value Date   NA 141 12/17/2021   K 3.8 12/17/2021   CL 104 12/09/2021   CO2 30 12/09/2021   BUN 24 (H) 12/09/2021   CREATININE 0.85 12/09/2021   GLUCOSE 102 (H) 12/09/2021   ALT 11 12/09/2021    Lab Results  Component Value Date   CHOL 135 09/02/2020   HDL 74 09/02/2020   LDLCALC 40 09/02/2020   LDLDIRECT 53 01/09/2017   TRIG 122 09/02/2020   CHOLHDL 1.8 09/02/2020    --------------------------------------------------------------------------------------------------  ASSESSMENT AND PLAN: Nonobstructive coronary artery disease: No angina reported.  Catheterization in June by Dr. Haroldine Laws showed moderate, nonobstructive disease.  Continue aspirin and statin therapy to prevent progression of disease.  Interstitial lung disease and pulmonary hypertension: Ms. Kluesner continues to have NYHA class III symptoms but feels like her breathing is a little bit better with addition of inhaled treprostinil.  Continue current medications and close follow-up through the pulmonary and advanced heart failure clinics.  Continue current dose of furosemide.  I suspect that her chronic left ankle edema is due to multiple surgical interventions and associated venous insufficiency/lymphedema.  Hypertension: Blood pressure suboptimally controlled today.  We have agreed to increase verapamil from 240 mg daily to 300 mg daily.  Follow-up: Return to clinic in 6 months.  Nelva Bush, MD 03/09/2022 10:54 AM

## 2022-03-09 NOTE — Patient Instructions (Signed)
Medication Instructions:   Your physician has recommended you make the following change in your medication:   STOP Verapamil SR 240 mg  START Verapamil (extended release - 24 hr) 300 mg daily   *If you need a refill on your cardiac medications before your next appointment, please call your pharmacy*   Lab Work:  None ordered  Testing/Procedures:  None ordered   Follow-Up: At [facility], you and your health needs are our priority.  As part of our continuing mission to provide you with exceptional heart care, we have created designated Provider Care Teams.  These Care Teams include your primary Cardiologist (physician) and Advanced Practice Providers (APPs -  Physician Assistants and Nurse Practitioners) who all work together to provide you with the care you need, when you need it.  We recommend signing up for the patient portal called "MyChart".  Sign up information is provided on this After Visit Summary.  MyChart is used to connect with patients for Virtual Visits (Telemedicine).  Patients are able to view lab/test results, encounter notes, upcoming appointments, etc.  Non-urgent messages can be sent to your provider as well.   To learn more about what you can do with MyChart, go to [email or web address].    Your next appointment:   6 month(s)  The format for your next appointment:   In Person  Provider:   You may see Nelva Bush, MD or one of the following Advanced Practice Providers on your designated Care Team:   Murray Hodgkins, NP Christell Faith, PA-C Cadence Kathlen Mody, [place]    Important Information About Sugar

## 2022-03-16 ENCOUNTER — Ambulatory Visit (INDEPENDENT_AMBULATORY_CARE_PROVIDER_SITE_OTHER): Payer: Medicare Other | Admitting: Ophthalmology

## 2022-03-16 ENCOUNTER — Other Ambulatory Visit: Payer: Self-pay

## 2022-03-16 ENCOUNTER — Encounter (INDEPENDENT_AMBULATORY_CARE_PROVIDER_SITE_OTHER): Payer: Self-pay | Admitting: Ophthalmology

## 2022-03-16 DIAGNOSIS — H35372 Puckering of macula, left eye: Secondary | ICD-10-CM | POA: Diagnosis not present

## 2022-03-16 DIAGNOSIS — Z961 Presence of intraocular lens: Secondary | ICD-10-CM

## 2022-03-16 DIAGNOSIS — Z79899 Other long term (current) drug therapy: Secondary | ICD-10-CM | POA: Diagnosis not present

## 2022-03-16 DIAGNOSIS — I10 Essential (primary) hypertension: Secondary | ICD-10-CM

## 2022-03-16 DIAGNOSIS — H353131 Nonexudative age-related macular degeneration, bilateral, early dry stage: Secondary | ICD-10-CM | POA: Diagnosis not present

## 2022-03-16 DIAGNOSIS — M059 Rheumatoid arthritis with rheumatoid factor, unspecified: Secondary | ICD-10-CM

## 2022-03-16 DIAGNOSIS — J849 Interstitial pulmonary disease, unspecified: Secondary | ICD-10-CM

## 2022-03-16 DIAGNOSIS — H35033 Hypertensive retinopathy, bilateral: Secondary | ICD-10-CM

## 2022-03-17 ENCOUNTER — Ambulatory Visit (INDEPENDENT_AMBULATORY_CARE_PROVIDER_SITE_OTHER): Payer: Medicare Other | Admitting: Nurse Practitioner

## 2022-03-17 ENCOUNTER — Ambulatory Visit (INDEPENDENT_AMBULATORY_CARE_PROVIDER_SITE_OTHER): Payer: Medicare Other | Admitting: Internal Medicine

## 2022-03-17 ENCOUNTER — Encounter: Payer: Self-pay | Admitting: Nurse Practitioner

## 2022-03-17 VITALS — BP 130/80 | HR 92 | Ht 65.5 in | Wt 205.8 lb

## 2022-03-17 DIAGNOSIS — J9611 Chronic respiratory failure with hypoxia: Secondary | ICD-10-CM | POA: Diagnosis not present

## 2022-03-17 DIAGNOSIS — J849 Interstitial pulmonary disease, unspecified: Secondary | ICD-10-CM

## 2022-03-17 DIAGNOSIS — I272 Pulmonary hypertension, unspecified: Secondary | ICD-10-CM | POA: Diagnosis not present

## 2022-03-17 DIAGNOSIS — J449 Chronic obstructive pulmonary disease, unspecified: Secondary | ICD-10-CM

## 2022-03-17 DIAGNOSIS — J4489 Other specified chronic obstructive pulmonary disease: Secondary | ICD-10-CM

## 2022-03-17 DIAGNOSIS — I2510 Atherosclerotic heart disease of native coronary artery without angina pectoris: Secondary | ICD-10-CM

## 2022-03-17 DIAGNOSIS — G4733 Obstructive sleep apnea (adult) (pediatric): Secondary | ICD-10-CM

## 2022-03-17 LAB — PULMONARY FUNCTION TEST
DL/VA % pred: 71 %
DL/VA: 3 ml/min/mmHg/L
DLCO cor % pred: 39 %
DLCO cor: 6.69 ml/min/mmHg
DLCO unc % pred: 39 %
DLCO unc: 6.69 ml/min/mmHg
FEF 25-75 Pre: 0.89 L/sec
FEF2575-%Pred-Pre: 63 %
FEV1-%Pred-Pre: 65 %
FEV1-Pre: 1.16 L
FEV1FVC-%Pred-Pre: 102 %
FEV6-%Pred-Pre: 68 %
FEV6-Pre: 1.53 L
FEV6FVC-%Pred-Pre: 105 %
FVC-%Pred-Pre: 64 %
FVC-Pre: 1.53 L
Pre FEV1/FVC ratio: 76 %
Pre FEV6/FVC Ratio: 100 %

## 2022-03-17 NOTE — Progress Notes (Signed)
_0  ID: [name], female    DOB: Dec 02, 1945, 76 y.o.   MRN: [id number]  Chief Complaint  Patient presents with   Follow-up    PFT results    Referring provider: Lorene Dy, MD  HPI: 76 year old female, former smoker followed for ILD, PH-ILD, COPD, chronic respiratory failure on 7 lpm supplemental oxygen, OSA on CPAP. She is a patient of Dr. Golden Pop and last seen via virtual visit by Volanda Napoleon, NP on 12/24/2021. Past medical history significant for CAD, CHF, allergic rhinitis, GERD, RA, anxiety, depression, HLD  TEST/EVENTS:  03/29/2021 PFT: FVC 63, FEV1 67, ratio 79, DLCO 40 05/14/2021 HRCT chest: Atherosclerosis/CAD.  No LAD.  Progressive interstitial lung disease, considered most compatible with alternative diagnosis not UIP.  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  Slight interval enlargement of a 4 mm right middle lobe pulmonary nodule, recommend repeat CT chest in 12 months.  Severe tracheobronchomalacia.  05/24/2021 PFT: FVC 60, FEV1 60, ratio 74, DLCO 47 11/18/2021 PFT: FVC 58, FEV1 62, ratio 80, DLCO 41 12/17/2021 LHC and RHC: Mid LAD lesion is 40% stenosis, proximal RCA lesion is 20% stenosed.  LVEF 55 to 65%.  Mild to moderate PAH with elevated PVR (3.5) 03/17/2022 PFT: FVC 64, FEV1 65, ratio 76, DLCO 39  12/24/2021: Virtual visit with Volanda Napoleon NP post [place] for PAH. She had mild to moderate PAH with elevated PVR. Started on Tyvaso DPI. Continued on Esbriet 801 mg tid with plans to repeat HRCT in October 2023. Continued on Trelegy and PRN nebs/albuterol for COPD. Excellent compliance with CPAP use. O2 stable on 7 lpm supplemental O2. Follow up in one month and then in August with [name].   03/17/2022: Today - follow up Patient presents today with husband for follow up after repeat pulmonary function testing, which were overall stable. She started on Tyvaso about 2 weeks ago. She does feel like it has helped her breathing and like she doesn't  have as much chest congestion/tightness. She still has very high symptom burden with limited activity tolerance. She's unable to do much around the house besides basic ADLs. Her husband is very supportive. They do go out to eat for lunch often and she gets winded walking from the parking lot to the oxygen. Cough is minimal. No wheezing, hemoptysis, fevers, increased leg swelling, orthopnea (on CPAP), PND. She continues on supplemental oxygen 7 lpm and maintains oxygen levels in the 90's. She wears CPAP nightly. Denies morning headaches or drowsy driving.     SYMPTOM SCALE - ILD 03/15/2019   05/17/2019   04/02/2020   05/13/2020     08/19/2020 covid 10/15/2020   04/27/2021 210# 09/16/2021   12/08/2021 204# 03/17/2022  O2 use x           5L [place] pule - ino study 6L [place] . Off iNO study 7L [place] wth home concerntator 7 L [place]  Shortness of Breath 0 -> 5 scale with 5 being worst (score 6 If unable to do)                   At rest _1  Simple tasks - showers, clothes change, eating, shaving _2  Household (dishes, doing bed, laundry) _3 Shopping _4 Walking level at own pace 3  _0 Walking up Stairs _1 Total (40 - 48) Dyspnea Score _2 How bad is your cough? _3 0 _4 How bad is your fatigue _5 nauea     0 0 0 0 0 0 0 0  vomit     0 0 0 0 0 0 0 0  diarrhea     _6 anxiety     _7 depression     _8 Allergies  Allergen Reactions   Cefdinir Diarrhea   Erythromycin Base Diarrhea   Lactose Dermatitis   Lactulose Diarrhea   Mesalamine Nausea Only   Methotrexate Other (See Comments)    Felt sick   Nitrofuran Derivatives     shakiness   Other Diarrhea and Other (See Comments)    Shaking uncontrollably "lettuce only" "lettuce only"   Sulfa Antibiotics Other (See Comments)    Patient can't recall  reaction    Tdap [Tetanus-Diphth-Acell Pertussis]     Shaking uncontrollably   Tetanus Toxoid, Adsorbed Other (See Comments)    Shaking uncontrollable     Immunization History  Administered Date(s) Administered   Fluad Quad(high Dose 65+) 03/15/2019   Influenza Split 03/18/2012, 03/18/2013, 04/14/2014, 04/17/2017   Influenza Whole 04/30/2008, 04/20/2009, 04/18/2011   Influenza, High Dose Seasonal PF 04/23/2015, 03/24/2016, 04/06/2018, 04/02/2020, 03/24/2021   Influenza,inj,quad, With Preservative 04/06/2018   PFIZER(Purple Top)SARS-COV-2 Vaccination 08/24/2019, 09/18/2019, 04/13/2020   Pneumococcal Conjugate-13 11/11/2013   Pneumococcal Polysaccharide-23 05/21/2018   Zoster Recombinat (Shingrix) 05/05/2017    Past Medical History:  Diagnosis Date   Acute asthmatic bronchitis    Allergic rhinitis    Anxiety    Arthritis    Esophageal reflux    Hypertension    Interstitial lung disease ([place]) dx jan 2020   Irritable bowel syndrome    Oxygen dependent    4 liters day time 6 liters at night   PONV (postoperative nausea and vomiting)    ponv likes zofran, and scopolamine patch   Pulmonary hypertension ([place])    Rheumatoid arthritis(714.0)    Sleep apnea     Tobacco History: Social History   Tobacco Use  Smoking Status Never  Smokeless Tobacco Never  Tobacco Comments   positive passive tobacco smoke exposure   Counseling given: Not Answered Tobacco comments: positive passive tobacco smoke exposure   Outpatient Medications Prior to Visit  Medication Sig Dispense Refill   albuterol (VENTOLIN HFA) 108 (90 Base) MCG/ACT inhaler USE 2 INHALATIONS BY MOUTH  EVERY 6 HOURS AS NEEDED 34 g 3   aspirin EC 81 MG tablet Take 81 mg by mouth daily.     benzonatate (TESSALON) 200 MG capsule Take 1 capsule (200 mg total) by mouth 3 (three) times daily as needed for cough. 90 capsule 2   celecoxib (CELEBREX) 200 MG capsule Take 200 mg by mouth daily.     cetirizine (ZYRTEC) 10 MG  tablet Take 20 mg by mouth daily.     Cholecalciferol (VITAMIN D3) 250 MCG (10000 UT) capsule Take 10,000 Units by mouth daily.     clidinium-chlordiazePOXIDE (LIBRAX) 5-2.5 MG capsule Take  1 capsule by mouth 3 (three) times daily as needed (for IBS).     colestipol (COLESTID) 1 g tablet Take 1 g by mouth daily.     dicyclomine (BENTYL) 20 MG tablet Take 20 mg by mouth 4 (four) times daily as needed (IBS).     FLUoxetine (PROZAC) 20 MG capsule Take 20 mg by mouth daily.     fluticasone (FLONASE) 50 MCG/ACT nasal spray SPRAY 2 SPRAYS INTO EACH NOSTRIL EVERY DAY 48 mL 3   furosemide (LASIX) 40 MG tablet TAKE 1 TABLET BY MOUTH DAILY 90 tablet 0   ipratropium (ATROVENT) 0.03 % nasal spray Use 1-2 sprays in both nostrils twice daily. 90 mL 3   ipratropium-albuterol (DUONEB) 0.5-2.5 (3) MG/3ML SOLN USE 1 VIAL IN NEBULIZER EVERY 6 HOURS AS NEEDED J45.901 270 mL 11   leflunomide (ARAVA) 20 MG tablet Take 20 mg by mouth daily.      methocarbamol (ROBAXIN) 750 MG tablet Take 750 mg by mouth 2 (two) times daily. As needed     montelukast (SINGULAIR) 10 MG tablet Take 1 tablet (10 mg total) by mouth at bedtime. 90 tablet 3   omeprazole (PRILOSEC) 40 MG capsule Take 40 mg by mouth at bedtime.      OXYGEN Inhale 7 L into the lungs continuous.     Pirfenidone (ESBRIET) 801 MG TABS Take 801 mg by mouth 3 (three) times daily. 270 tablet 3   Polyethyl Glycol-Propyl Glycol (SYSTANE) 0.4-0.3 % GEL ophthalmic gel Place 2 application. into both eyes at bedtime.     Polyethyl Glycol-Propyl Glycol (SYSTANE) 0.4-0.3 % SOLN Place 2 drops into both eyes daily as needed (Dry eyes).     potassium chloride SA (KLOR-CON M20) 20 MEQ tablet Take 1 tablet (20 mEq total) by mouth 2 (two) times daily. 180 tablet 0   predniSONE (DELTASONE) 5 MG tablet Take 5 mg by mouth daily with breakfast.     rosuvastatin (CRESTOR) 20 MG tablet Take 1 tablet (20 mg total) by mouth daily. 90 tablet 0   Tocilizumab (ACTEMRA IV) Inject into the  vein every 30 (thirty) days. infusion     traMADol (ULTRAM) 50 MG tablet Take 50 mg by mouth 3 (three) times daily as needed for moderate pain. Can take up to 4 pills a day as needed.     traZODone (DESYREL) 100 MG tablet Take 100 mg by mouth at bedtime.     TRELEGY ELLIPTA 100-62.5-25 MCG/ACT AEPB USE 1 INHALATION BY MOUTH DAILY 180 each 3   Treprostinil (TYVASO DPI TITRATION KIT) 16 & 32 & 48 MCG POWD Inhale into the lungs in the morning, at noon, in the evening, and at bedtime.     Verapamil HCl CR 300 MG CP24 Take 1 capsule (300 mg total) by mouth daily. 90 capsule 1   No facility-administered medications prior to visit.     Review of Systems:   Constitutional: No weight loss or gain, night sweats, fevers, chills, or lassitude. +fatigue  HEENT: No headaches, difficulty swallowing, tooth/dental problems, or sore throat. No sneezing, itching, ear ache, nasal congestion, or post nasal drip CV:  +swelling in lower extremities (chronic LLL; stable). No chest pain, orthopnea, PND, anasarca, dizziness, palpitations, syncope Resp: +shortness of breath with exertion; minimal cough. No excess mucus or change in color of mucus. No hemoptysis. No wheezing.  No chest wall deformity GI:  No heartburn, indigestion, abdominal pain, nausea, vomiting, diarrhea, change in bowel habits, loss of appetite, bloody stools.  MSK:  No joint pain or swelling.  No decreased range of motion.  No back pain. Neuro: No dizziness or lightheadedness.  Psych: +depression (waxes and wanes; stable); No SI/HI. Mood stable.     Physical Exam:  BP 130/80 (BP Location: Right Arm, Cuff Size: Large)   Pulse 92   Ht 5' 5.5" (1.664 m)   Wt 205 lb 12.8 oz (93.4 kg)   SpO2 97% Comment: 8L  BMI 33.73 kg/m   GEN: Pleasant, interactive, chronically-ill appearing; obese; in no acute distress. HEENT:  Normocephalic and atraumatic. PERRLA. Sclera white. Nasal turbinates pink, moist and patent bilaterally. No rhinorrhea present.  Oropharynx pink and moist, without exudate or edema. No lesions, ulcerations, or postnasal drip.  NECK:  Supple w/ fair ROM. No JVD present. Normal carotid impulses w/o bruits. Thyroid symmetrical with no goiter or nodules palpated. No lymphadenopathy.   CV: RRR, no m/r/g, no peripheral edema. Pulses intact, +2 bilaterally. No cyanosis, pallor or clubbing. PULMONARY:  Unlabored, regular breathing. Bibasilar crackles bilaterally w/o wheezes/rales/rhonchi. No accessory muscle use. No dullness to percussion. GI: BS present and normoactive. Soft, non-tender to palpation.  MSK: No erythema, warmth or tenderness. Left ankle edema (baseline). Cap refil <2 sec all extrem. No deformities or joint swelling noted.  Neuro: A/Ox3. No focal deficits noted.   Skin: Warm, no lesions or rashe Psych: Normal affect and behavior. Judgement and thought content appropriate.     Lab Results:  CBC    Component Value Date/Time   WBC 9.7 12/09/2021 1440   RBC 3.49 (L) 12/09/2021 1440   HGB 10.9 (L) 12/17/2021 0843   HGB 14.4 09/23/2016 1252   HCT 32.0 (L) 12/17/2021 0843   HCT 44.7 09/23/2016 1252   PLT 210.0 12/09/2021 1440   PLT 342 09/23/2016 1252   MCV 101.4 (H) 12/09/2021 1440   MCV 88 09/23/2016 1252   MCH 31.9 11/09/2020 0823   MCHC 31.6 12/09/2021 1440   RDW 14.9 12/09/2021 1440   RDW 15.4 09/23/2016 1252   LYMPHSABS 0.7 12/09/2021 1440   LYMPHSABS 1.2 09/23/2016 1252   MONOABS 0.7 12/09/2021 1440   EOSABS 0.1 12/09/2021 1440   EOSABS 0.0 09/23/2016 1252   BASOSABS 0.0 12/09/2021 1440   BASOSABS 0.0 09/23/2016 1252    BMET    Component Value Date/Time   NA 141 12/17/2021 0843   NA 142 01/09/2017 1024   K 3.8 12/17/2021 0843   CL 104 12/09/2021 1440   CO2 30 12/09/2021 1440   GLUCOSE 102 (H) 12/09/2021 1440   BUN 24 (H) 12/09/2021 1440   BUN 18 01/09/2017 1024   CREATININE 0.85 12/09/2021 1440   CREATININE 0.91 05/30/2016 1002   CALCIUM 10.4 12/09/2021 1440   GFRNONAA >60  11/09/2020 0823   GFRAA >60 12/22/2018 0326    BNP No results found for: "BNP"   Imaging:  OCT, Retina - OU - Both Eyes  Result Date: 03/16/2022 Right Eye Quality was good. Central Foveal Thickness: 285. Progression has been stable. Findings include normal foveal contour, no IRF, no SRF, retinal drusen (Fine drusen, no ORA, no Plaquenil toxicity). Left Eye Quality was good. Central Foveal Thickness: 305. Progression has been stable. Findings include normal foveal contour, no IRF, no SRF, retinal drusen , epiretinal membrane (Mild ERM, Fine drusen, no ORA, no Plaquenil toxicity). Notes *Images captured and stored on drive Diagnosis / Impression: NFP, no IRF/SRF OU Fine drusen, no ORA, no Plaquenil toxicity OU Mild ERM OS Clinical management: See below Abbreviations: NFP - Normal foveal  profile. CME - cystoid macular edema. PED - pigment epithelial detachment. IRF - intraretinal fluid. SRF - subretinal fluid. EZ - ellipsoid zone. ERM - epiretinal membrane. ORA - outer retinal atrophy. ORT - outer retinal tubulation. SRHM - subretinal hyper-reflective material. IRHM - intraretinal hyper-reflective material         Latest Ref Rng & Units 03/17/2022   11:22 AM 12/08/2021    3:01 PM 05/24/2021   12:43 PM 03/29/2021    2:07 PM 02/02/2021    8:12 AM 12/08/2020    9:03 AM 10/15/2020    9:59 AM  PFT Results  FVC-Pre L 1.53  P 1.38  1.44  1.54  2.00  1.45  1.47   FVC-Predicted Pre % 64  P 58  60  63  82  63  63   Pre FEV1/FVC % % 76  P 80  74  79  63  78  83   FEV1-Pre L 1.16  P 1.10  1.06  1.21  1.26  1.14  1.21   FEV1-Predicted Pre % 65  P 62  60  67  69  66  70   DLCO uncorrected ml/min/mmHg 6.69  P 7.13  8.17  6.95  9.22  9.05  9.05   DLCO UNC% % 39  P 41  47  40  53  54  54   DLCO corrected ml/min/mmHg 6.69  P 7.13      9.05   DLCO COR %Predicted % 39  P 41      54   DLVA Predicted % 71  P 74  76  72  85  96  97     P Preliminary result    Lab Results  Component Value Date   NITRICOXIDE  139 04/27/2021        Assessment & Plan:   ILD (interstitial lung disease) (HCC) Progressive phenotype. She has very high symptom burden and has declined over the past few years. PFTs stable today. She was recently diagnosed with PH-ILD and started on Tyvaso; has had improvement in breathing over the past two weeks with this. Plan for repeat HRCT chest in October. We will repeat spirometry/DLCO in 3 months. No forehead probes available today and she preferred to wait for walk test. Plan to do this at follow up in November 2023. Tolerating Esbriet well; no changes made today.  Patient Instructions  Continue Trelegy 1 puff daily. Brush tongue and rinse mouth afterwards Continue Albuterol inhaler 2 puffs or 3 mL neb every 6 hours as needed for shortness of breath or wheezing. Notify if symptoms persist despite rescue inhaler/neb use. Continue cetirizine 1 pill daily for allergies Continue flonase 2 sprays each nostril daily Continue singulair 1 tab daily for allergies  Continue omeprazole 1 tab daily for reflux Continue supplemental oxygen 7 lpm for goal >86% Continue Tyvaso inhaler mevery 4 hours  Continue lasix 40 mg daily  Continue CPAP at night, minimum 4-6 hours every night   High resolution CT chest in October - someone will contact you for scheduling  Walk test at next follow up  Follow up in November with repeat DLCO/spirometry (30 min PFT) and [name]. If symptoms do not improve or worsen, please contact office for sooner follow up or seek emergency care.     Pulmonary hypertension ([place]) PH-ILD. Tolerating Tyvaso well. She is up to 64 mcg four times a day. Continue lasix 40 mg daily. She appears euvolemic today.   COPD with chronic bronchitis  and emphysema ([place]) Moderate COPD; stable on recent PFTs. High symptom burden which is multifactorial. She continues on triple therapy with Trelegy. Uses albuterol neb nightly before bed. See above.   Chronic respiratory failure  with hypoxia (HCC) Stable without any increased O2 demand. Continue 7 lpm for goal >88-90%. Plan for walking oximetry with forehead probe at follow up.   Obstructive sleep apnea Excellent compliance and continues to receive good benefit from use. No changes to current settings. Continue nightly use.    I spent 38 minutes of dedicated to the care of this patient on the date of this encounter to include pre-visit review of records, face-to-face time with the patient discussing conditions above, post visit ordering of testing, clinical documentation with the electronic health record, making appropriate referrals as documented, and communicating necessary findings to members of the patients care team.  Clayton Bibles, NP 03/17/2022  Pt aware and understands NP's role.

## 2022-03-17 NOTE — Assessment & Plan Note (Signed)
Moderate COPD; stable on recent PFTs. High symptom burden which is multifactorial. She continues on triple therapy with Trelegy. Uses albuterol neb nightly before bed. See above.

## 2022-03-17 NOTE — Assessment & Plan Note (Signed)
Excellent compliance and continues to receive good benefit from use. No changes to current settings. Continue nightly use.

## 2022-03-17 NOTE — Assessment & Plan Note (Signed)
PH-ILD. Tolerating Tyvaso well. She is up to 64 mcg four times a day. Continue lasix 40 mg daily. She appears euvolemic today.

## 2022-03-17 NOTE — Progress Notes (Signed)
Spirometry and DLCO Performed Today.

## 2022-03-17 NOTE — Assessment & Plan Note (Signed)
Stable without any increased O2 demand. Continue 7 lpm for goal >88-90%. Plan for walking oximetry with forehead probe at follow up.

## 2022-03-17 NOTE — Assessment & Plan Note (Signed)
Progressive phenotype. She has very high symptom burden and has declined over the past few years. PFTs stable today. She was recently diagnosed with PH-ILD and started on Tyvaso; has had improvement in breathing over the past two weeks with this. Plan for repeat HRCT chest in October. We will repeat spirometry/DLCO in 3 months. No forehead probes available today and she preferred to wait for walk test. Plan to do this at follow up in November 2023. Tolerating Esbriet well; no changes made today.  Patient Instructions  Continue Trelegy 1 puff daily. Brush tongue and rinse mouth afterwards Continue Albuterol inhaler 2 puffs or 3 mL neb every 6 hours as needed for shortness of breath or wheezing. Notify if symptoms persist despite rescue inhaler/neb use. Continue cetirizine 1 pill daily for allergies Continue flonase 2 sprays each nostril daily Continue singulair 1 tab daily for allergies  Continue omeprazole 1 tab daily for reflux Continue supplemental oxygen 7 lpm for goal >86% Continue Tyvaso inhaler mevery 4 hours  Continue lasix 40 mg daily  Continue CPAP at night, minimum 4-6 hours every night   High resolution CT chest in October - someone will contact you for scheduling  Walk test at next follow up  Follow up in November with repeat DLCO/spirometry (30 min PFT) and [name]. If symptoms do not improve or worsen, please contact office for sooner follow up or seek emergency care.

## 2022-03-17 NOTE — Patient Instructions (Signed)
Spirometry and DLCO Performed Today.  

## 2022-03-17 NOTE — Patient Instructions (Addendum)
Continue Trelegy 1 puff daily. Brush tongue and rinse mouth afterwards Continue Albuterol inhaler 2 puffs or 3 mL neb every 6 hours as needed for shortness of breath or wheezing. Notify if symptoms persist despite rescue inhaler/neb use. Continue cetirizine 1 pill daily for allergies Continue flonase 2 sprays each nostril daily Continue singulair 1 tab daily for allergies  Continue omeprazole 1 tab daily for reflux Continue supplemental oxygen 7 lpm for goal >86% Continue Tyvaso inhaler mevery 4 hours  Continue lasix 40 mg daily  Continue CPAP at night, minimum 4-6 hours every night   High resolution CT chest in October - someone will contact you for scheduling  Walk test at next follow up  Follow up in November with repeat DLCO/spirometry (30 min PFT) and [name]. If symptoms do not improve or worsen, please contact office for sooner follow up or seek emergency care.

## 2022-04-07 ENCOUNTER — Other Ambulatory Visit: Payer: Self-pay | Admitting: Internal Medicine

## 2022-04-20 ENCOUNTER — Telehealth: Payer: Self-pay | Admitting: Internal Medicine

## 2022-04-20 ENCOUNTER — Encounter (HOSPITAL_BASED_OUTPATIENT_CLINIC_OR_DEPARTMENT_OTHER): Payer: Self-pay

## 2022-04-20 ENCOUNTER — Encounter: Payer: Self-pay | Admitting: Internal Medicine

## 2022-04-20 ENCOUNTER — Emergency Department (HOSPITAL_BASED_OUTPATIENT_CLINIC_OR_DEPARTMENT_OTHER): Payer: Medicare Other | Admitting: Radiology

## 2022-04-20 ENCOUNTER — Inpatient Hospital Stay (HOSPITAL_BASED_OUTPATIENT_CLINIC_OR_DEPARTMENT_OTHER)
Admission: EM | Admit: 2022-04-20 | Discharge: 2022-05-03 | DRG: 291 | Disposition: A | Discharge status: Home Health | Payer: Medicare Other | Attending: Internal Medicine | Admitting: Internal Medicine

## 2022-04-20 ENCOUNTER — Encounter (HOSPITAL_COMMUNITY): Payer: Self-pay

## 2022-04-20 DIAGNOSIS — L309 Dermatitis, unspecified: Secondary | ICD-10-CM | POA: Diagnosis present

## 2022-04-20 DIAGNOSIS — Z9981 Dependence on supplemental oxygen: Secondary | ICD-10-CM

## 2022-04-20 DIAGNOSIS — J849 Interstitial pulmonary disease, unspecified: Secondary | ICD-10-CM | POA: Diagnosis present

## 2022-04-20 DIAGNOSIS — I5032 Chronic diastolic (congestive) heart failure: Secondary | ICD-10-CM | POA: Insufficient documentation

## 2022-04-20 DIAGNOSIS — I2721 Secondary pulmonary arterial hypertension: Secondary | ICD-10-CM | POA: Diagnosis present

## 2022-04-20 DIAGNOSIS — Z9049 Acquired absence of other specified parts of digestive tract: Secondary | ICD-10-CM

## 2022-04-20 DIAGNOSIS — Z7960 Long term (current) use of unspecified immunomodulators and immunosuppressants: Secondary | ICD-10-CM

## 2022-04-20 DIAGNOSIS — Z981 Arthrodesis status: Secondary | ICD-10-CM

## 2022-04-20 DIAGNOSIS — Z7982 Long term (current) use of aspirin: Secondary | ICD-10-CM

## 2022-04-20 DIAGNOSIS — I2510 Atherosclerotic heart disease of native coronary artery without angina pectoris: Secondary | ICD-10-CM | POA: Diagnosis present

## 2022-04-20 DIAGNOSIS — I5033 Acute on chronic diastolic (congestive) heart failure: Secondary | ICD-10-CM | POA: Diagnosis present

## 2022-04-20 DIAGNOSIS — Z7952 Long term (current) use of systemic steroids: Secondary | ICD-10-CM

## 2022-04-20 DIAGNOSIS — Z8249 Family history of ischemic heart disease and other diseases of the circulatory system: Secondary | ICD-10-CM

## 2022-04-20 DIAGNOSIS — E785 Hyperlipidemia, unspecified: Secondary | ICD-10-CM | POA: Diagnosis present

## 2022-04-20 DIAGNOSIS — K521 Toxic gastroenteritis and colitis: Secondary | ICD-10-CM | POA: Diagnosis not present

## 2022-04-20 DIAGNOSIS — D849 Immunodeficiency, unspecified: Secondary | ICD-10-CM | POA: Diagnosis present

## 2022-04-20 DIAGNOSIS — D72829 Elevated white blood cell count, unspecified: Secondary | ICD-10-CM | POA: Diagnosis present

## 2022-04-20 DIAGNOSIS — I272 Pulmonary hypertension, unspecified: Secondary | ICD-10-CM | POA: Diagnosis present

## 2022-04-20 DIAGNOSIS — Z79899 Other long term (current) drug therapy: Secondary | ICD-10-CM

## 2022-04-20 DIAGNOSIS — Z887 Allergy status to serum and vaccine status: Secondary | ICD-10-CM

## 2022-04-20 DIAGNOSIS — F341 Dysthymic disorder: Secondary | ICD-10-CM | POA: Diagnosis present

## 2022-04-20 DIAGNOSIS — Z888 Allergy status to other drugs, medicaments and biological substances status: Secondary | ICD-10-CM

## 2022-04-20 DIAGNOSIS — Z96641 Presence of right artificial hip joint: Secondary | ICD-10-CM | POA: Diagnosis present

## 2022-04-20 DIAGNOSIS — J9621 Acute and chronic respiratory failure with hypoxia: Secondary | ICD-10-CM | POA: Diagnosis present

## 2022-04-20 DIAGNOSIS — L03116 Cellulitis of left lower limb: Secondary | ICD-10-CM | POA: Diagnosis present

## 2022-04-20 DIAGNOSIS — K219 Gastro-esophageal reflux disease without esophagitis: Secondary | ICD-10-CM | POA: Diagnosis present

## 2022-04-20 DIAGNOSIS — T3695XA Adverse effect of unspecified systemic antibiotic, initial encounter: Secondary | ICD-10-CM | POA: Diagnosis not present

## 2022-04-20 DIAGNOSIS — G4733 Obstructive sleep apnea (adult) (pediatric): Secondary | ICD-10-CM | POA: Diagnosis present

## 2022-04-20 DIAGNOSIS — F419 Anxiety disorder, unspecified: Secondary | ICD-10-CM | POA: Diagnosis present

## 2022-04-20 DIAGNOSIS — R609 Edema, unspecified: Principal | ICD-10-CM

## 2022-04-20 DIAGNOSIS — Z8261 Family history of arthritis: Secondary | ICD-10-CM

## 2022-04-20 DIAGNOSIS — Z96653 Presence of artificial knee joint, bilateral: Secondary | ICD-10-CM | POA: Diagnosis present

## 2022-04-20 DIAGNOSIS — R7989 Other specified abnormal findings of blood chemistry: Secondary | ICD-10-CM | POA: Diagnosis present

## 2022-04-20 DIAGNOSIS — M069 Rheumatoid arthritis, unspecified: Secondary | ICD-10-CM | POA: Diagnosis present

## 2022-04-20 DIAGNOSIS — Z6839 Body mass index (BMI) 39.0-39.9, adult: Secondary | ICD-10-CM

## 2022-04-20 DIAGNOSIS — E876 Hypokalemia: Secondary | ICD-10-CM | POA: Diagnosis present

## 2022-04-20 DIAGNOSIS — Z882 Allergy status to sulfonamides status: Secondary | ICD-10-CM

## 2022-04-20 DIAGNOSIS — I110 Hypertensive heart disease with heart failure: Secondary | ICD-10-CM | POA: Diagnosis not present

## 2022-04-20 DIAGNOSIS — J309 Allergic rhinitis, unspecified: Secondary | ICD-10-CM | POA: Diagnosis present

## 2022-04-20 DIAGNOSIS — Z881 Allergy status to other antibiotic agents status: Secondary | ICD-10-CM

## 2022-04-20 DIAGNOSIS — Z91011 Allergy to milk products: Secondary | ICD-10-CM

## 2022-04-20 DIAGNOSIS — E669 Obesity, unspecified: Secondary | ICD-10-CM | POA: Diagnosis present

## 2022-04-20 DIAGNOSIS — R233 Spontaneous ecchymoses: Secondary | ICD-10-CM | POA: Diagnosis present

## 2022-04-20 DIAGNOSIS — F32A Depression, unspecified: Secondary | ICD-10-CM | POA: Diagnosis present

## 2022-04-20 DIAGNOSIS — J9611 Chronic respiratory failure with hypoxia: Secondary | ICD-10-CM | POA: Diagnosis present

## 2022-04-20 DIAGNOSIS — L0390 Cellulitis, unspecified: Secondary | ICD-10-CM | POA: Diagnosis present

## 2022-04-20 DIAGNOSIS — I083 Combined rheumatic disorders of mitral, aortic and tricuspid valves: Secondary | ICD-10-CM | POA: Diagnosis present

## 2022-04-20 DIAGNOSIS — R600 Localized edema: Secondary | ICD-10-CM

## 2022-04-20 DIAGNOSIS — Z90710 Acquired absence of both cervix and uterus: Secondary | ICD-10-CM

## 2022-04-20 LAB — CBC WITH DIFFERENTIAL/PLATELET
Abs Immature Granulocytes: 0.04 10*3/uL (ref 0.00–0.07)
Basophils Absolute: 0 10*3/uL (ref 0.0–0.1)
Basophils Relative: 0 %
Eosinophils Absolute: 0 10*3/uL (ref 0.0–0.5)
Eosinophils Relative: 0 %
HCT: 38.6 % (ref 36.0–46.0)
Hemoglobin: 12.3 g/dL (ref 12.0–15.0)
Immature Granulocytes: 0 %
Lymphocytes Relative: 3 %
Lymphs Abs: 0.3 10*3/uL — ABNORMAL LOW (ref 0.7–4.0)
MCH: 32 pg (ref 26.0–34.0)
MCHC: 31.9 g/dL (ref 30.0–36.0)
MCV: 100.5 fL — ABNORMAL HIGH (ref 80.0–100.0)
Monocytes Absolute: 0.6 10*3/uL (ref 0.1–1.0)
Monocytes Relative: 6 %
Neutro Abs: 10.5 10*3/uL — ABNORMAL HIGH (ref 1.7–7.7)
Neutrophils Relative %: 91 %
Platelets: 180 10*3/uL (ref 150–400)
RBC: 3.84 MIL/uL — ABNORMAL LOW (ref 3.87–5.11)
RDW: 14.3 % (ref 11.5–15.5)
WBC: 11.6 10*3/uL — ABNORMAL HIGH (ref 4.0–10.5)
nRBC: 0 % (ref 0.0–0.2)

## 2022-04-20 LAB — COMPREHENSIVE METABOLIC PANEL
ALT: 16 U/L (ref 0–44)
AST: 16 U/L (ref 15–41)
Albumin: 3.8 g/dL (ref 3.5–5.0)
Alkaline Phosphatase: 67 U/L (ref 38–126)
Anion gap: 7 (ref 5–15)
BUN: 23 mg/dL (ref 8–23)
CO2: 30 mmol/L (ref 22–32)
Calcium: 9.7 mg/dL (ref 8.9–10.3)
Chloride: 99 mmol/L (ref 98–111)
Creatinine, Ser: 0.94 mg/dL (ref 0.44–1.00)
GFR, Estimated: >60 mL/min (ref >60)
Glucose, Bld: 145 mg/dL — ABNORMAL HIGH (ref 70–99)
Potassium: 3.6 mmol/L (ref 3.5–5.1)
Sodium: 136 mmol/L (ref 135–145)
Total Bilirubin: 0.6 mg/dL (ref 0.3–1.2)
Total Protein: 5.7 g/dL — ABNORMAL LOW (ref 6.5–8.1)

## 2022-04-20 LAB — BRAIN NATRIURETIC PEPTIDE: B Natriuretic Peptide: 36 pg/mL (ref 0.0–100.0)

## 2022-04-20 LAB — TROPONIN I (HIGH SENSITIVITY)
Troponin I (High Sensitivity): 21 ng/L — ABNORMAL HIGH (ref <18)
Troponin I (High Sensitivity): 22 ng/L — ABNORMAL HIGH (ref <18)

## 2022-04-20 MED ORDER — OXYCODONE HCL 5 MG PO TABS
5.0000 mg | ORAL_TABLET | Freq: Once | ORAL | Status: AC
  Administered 2022-04-20: 5 mg via ORAL
  Filled 2022-04-20: qty 1

## 2022-04-20 MED ORDER — FUROSEMIDE 10 MG/ML IJ SOLN
40.0000 mg | Freq: Once | INTRAMUSCULAR | Status: AC
  Administered 2022-04-20: 40 mg via INTRAVENOUS
  Filled 2022-04-20: qty 4

## 2022-04-20 MED ORDER — FENTANYL CITRATE PF 50 MCG/ML IJ SOSY
50.0000 ug | PREFILLED_SYRINGE | Freq: Once | INTRAMUSCULAR | Status: AC
  Administered 2022-04-20: 50 ug via INTRAVENOUS
  Filled 2022-04-20: qty 1

## 2022-04-20 NOTE — ED Provider Notes (Signed)
[place] [facility] EMERGENCY DEPT Provider Note   CSN: [id number] Arrival date & time: 04/20/22  1752     History {Add pertinent medical, surgical, social history, OB history to HPI:1} Chief Complaint  Patient presents with   Leg Swelling   Shortness of Breath    ENEDINA [name] is a 76 y.o. female.  Patient with history of interstitial lung disease, pulmonary hypertension, rheumatoid arthritis on chronic prednisone, 7 L of oxygen at baseline, anxiety presenting with leg swelling for the past 4 to 5 days.  States she has increased pain and swelling to her legs up to her knees left greater than right for the past several days.  Taking her Lasix as prescribed.  Does feel short of breath but not much worse than baseline.  Denies fevers, chills, nausea or vomiting.  Denies any history of blood clots previously.  No anticoagulation use.  She is noticed a itchy rash to her anterior left shin over the past 2 days as well.  She sent pictures to her cardiologist and was told to come to the ED.  The history is provided by the patient.  Shortness of Breath Associated symptoms: rash   Associated symptoms: no vomiting        Home Medications Prior to Admission medications   Medication Sig Start Date End Date Taking? Authorizing Provider  albuterol (VENTOLIN HFA) 108 (90 Base) MCG/ACT inhaler USE 2 INHALATIONS BY MOUTH  EVERY 6 HOURS AS NEEDED 01/10/22   Brand Males, MD  aspirin EC 81 MG tablet Take 81 mg by mouth daily.    [provider]  benzonatate (TESSALON) 200 MG capsule TAKE 1 CAPSULE (200 MG TOTAL) BY MOUTH 3 (THREE) TIMES DAILY AS NEEDED FOR COUGH. 04/07/22   Brand Males, MD  celecoxib (CELEBREX) 200 MG capsule Take 200 mg by mouth daily. 03/30/20   [provider]  cetirizine (ZYRTEC) 10 MG tablet Take 20 mg by mouth daily.    [provider]  Cholecalciferol (VITAMIN D3) 250 MCG (10000 UT) capsule Take 10,000 Units by mouth daily.     [provider]  clidinium-chlordiazePOXIDE (LIBRAX) 5-2.5 MG capsule Take 1 capsule by mouth 3 (three) times daily as needed (for IBS).    [provider]  colestipol (COLESTID) 1 g tablet Take 1 g by mouth daily.    [provider]  dicyclomine (BENTYL) 20 MG tablet Take 20 mg by mouth 4 (four) times daily as needed (IBS).    [provider]  FLUoxetine (PROZAC) 20 MG capsule Take 20 mg by mouth daily. 08/11/16   [provider]  fluticasone (FLONASE) 50 MCG/ACT nasal spray SPRAY 2 SPRAYS INTO EACH NOSTRIL EVERY DAY 04/01/20   Lauraine Rinne, NP  furosemide (LASIX) 40 MG tablet TAKE 1 TABLET BY MOUTH DAILY 03/03/22   End, Harrell Gave, MD  ipratropium (ATROVENT) 0.03 % nasal spray Use 1-2 sprays in both nostrils twice daily. 07/14/21   Brand Males, MD  ipratropium-albuterol (DUONEB) 0.5-2.5 (3) MG/3ML SOLN USE 1 VIAL IN NEBULIZER EVERY 6 HOURS AS NEEDED J45.901 08/17/21   Brand Males, MD  leflunomide (ARAVA) 20 MG tablet Take 20 mg by mouth daily.  03/24/16   [provider]  methocarbamol (ROBAXIN) 750 MG tablet Take 750 mg by mouth 2 (two) times daily. As needed 07/22/20   [provider]  montelukast (SINGULAIR) 10 MG tablet Take 1 tablet (10 mg total) by mouth at bedtime. 07/14/21   Brand Males, MD  omeprazole (PRILOSEC) 40 MG  capsule Take 40 mg by mouth at bedtime.     [provider]  OXYGEN Inhale 7 L into the lungs continuous.    [provider]  Pirfenidone (ESBRIET) 801 MG TABS Take 801 mg by mouth 3 (three) times daily. 07/15/21   Brand Males, MD  Polyethyl Glycol-Propyl Glycol (SYSTANE) 0.4-0.3 % GEL ophthalmic gel Place 2 application. into both eyes at bedtime.    [provider]  Polyethyl Glycol-Propyl Glycol (SYSTANE) 0.4-0.3 % SOLN Place 2 drops into both eyes daily as needed (Dry eyes).    [provider]  potassium chloride SA (KLOR-CON M20) 20 MEQ tablet Take 1  tablet (20 mEq total) by mouth 2 (two) times daily. 03/03/22   End, Harrell Gave, MD  predniSONE (DELTASONE) 5 MG tablet Take 5 mg by mouth daily with breakfast.    [provider]  rosuvastatin (CRESTOR) 20 MG tablet Take 1 tablet (20 mg total) by mouth daily. 03/09/22   End, Harrell Gave, MD  Tocilizumab (ACTEMRA IV) Inject into the vein every 30 (thirty) days. infusion    [provider]  traMADol (ULTRAM) 50 MG tablet Take 50 mg by mouth 3 (three) times daily as needed for moderate pain. Can take up to 4 pills a day as needed. 07/03/20   [provider]  traZODone (DESYREL) 100 MG tablet Take 100 mg by mouth at bedtime.    [provider]  Donnal Debar 100-62.5-25 MCG/ACT AEPB USE 1 INHALATION BY MOUTH DAILY 12/03/21   Brand Males, MD  Treprostinil (TYVASO DPI TITRATION KIT) 16 & 32 & 48 MCG POWD Inhale into the lungs in the morning, at noon, in the evening, and at bedtime.    [provider]  Verapamil HCl CR 300 MG CP24 Take 1 capsule (300 mg total) by mouth daily. 03/09/22 09/05/22  End, Harrell Gave, MD      Allergies    Cefdinir; Erythromycin base; Lactose; Lactulose; Mesalamine; Methotrexate; Nitrofuran derivatives; Other; Sulfa antibiotics; Tdap [tetanus-diphth-acell pertussis]; and Tetanus toxoid, adsorbed    Review of Systems   Review of Systems  Respiratory:  Positive for shortness of breath.   Gastrointestinal:  Negative for nausea and vomiting.  Musculoskeletal:  Positive for arthralgias, joint swelling and myalgias.  Skin:  Positive for rash and wound.  Neurological:  Negative for dizziness.   all other systems are negative except as noted in the HPI and PMH.    Physical Exam Updated Vital Signs BP (!) 153/94 (BP Location: Right Arm)   Pulse 95   Temp 98.6 F (37 C) (Oral)   Resp 12   Ht 5' 5.5" (1.664 m)   Wt 90.7 kg   SpO2 91%   BMI 32.78 kg/m  Physical Exam Vitals and nursing note reviewed.  Constitutional:       General: She is not in acute distress.    Appearance: She is well-developed. She is ill-appearing.     Comments: Chronic ill-appearing and dyspneic  HENT:     Head: Normocephalic and atraumatic.     Mouth/Throat:     Pharynx: No oropharyngeal exudate.  Eyes:     Conjunctiva/sclera: Conjunctivae normal.     Pupils: Pupils are equal, round, and reactive to light.  Neck:     Comments: No meningismus. Cardiovascular:     Rate and Rhythm: Normal rate and regular rhythm.     Heart sounds: Normal heart sounds. No murmur heard. Pulmonary:     Effort: Pulmonary effort is normal. No respiratory distress.  Breath sounds: Rales present.  Abdominal:     Palpations: Abdomen is soft.     Tenderness: There is no abdominal tenderness. There is no guarding or rebound.  Musculoskeletal:        General: No tenderness. Normal range of motion.     Cervical back: Normal range of motion and neck supple.     Right lower leg: Edema present.     Left lower leg: Edema present.     Comments: Bilateral leg swelling, left greater than right.  Chronic surgical deformity to left foot.  Intact DP and PT pulses with good Doppler.  Erythematous rash to left anterior shin  Skin:    General: Skin is warm.     Findings: Rash present. No lesion.  Neurological:     Mental Status: She is alert and oriented to person, place, and time.     Cranial Nerves: No cranial nerve deficit.     Motor: No abnormal muscle tone.     Coordination: Coordination normal.     Comments:  5/5 strength throughout. CN 2-12 intact.Equal grip strength.   Psychiatric:        Behavior: Behavior normal.     ED Results / Procedures / Treatments   Labs (all labs ordered are listed, but only abnormal results are displayed) Labs Reviewed  CBC WITH DIFFERENTIAL/PLATELET  COMPREHENSIVE METABOLIC PANEL  BRAIN NATRIURETIC PEPTIDE  TROPONIN I (HIGH SENSITIVITY)    EKG EKG Interpretation  Date/Time:  Wednesday April 20 2022 18:09:44  EDT Ventricular Rate:  96 PR Interval:  151 QRS Duration: 94 QT Interval:  350 QTC Calculation: 443 R Axis:   -60 Text Interpretation: Sinus rhythm Multiple ventricular premature complexes Left anterior fascicular block Low voltage, precordial leads Consider anterior infarct No significant change was found Confirmed by Ezequiel Essex [phone]) on 04/20/2022 6:21:01 PM  Radiology No results found.  Procedures Procedures  {Document cardiac monitor, telemetry assessment procedure when appropriate:1}  Medications Ordered in ED Medications  furosemide (LASIX) injection 40 mg (has no administration in time range)  oxyCODONE (Oxy IR/ROXICODONE) immediate release tablet 5 mg (has no administration in time range)    ED Course/ Medical Decision Making/ A&P                           Medical Decision Making Amount and/or Complexity of Data Reviewed Labs: ordered. Decision-making details documented in ED Course. Radiology: ordered and independent interpretation performed. Decision-making details documented in ED Course. ECG/medicine tests: ordered and independent interpretation performed. Decision-making details documented in ED Course.  Risk Prescription drug management.  Patient with chronic lung disease and respiratory failure with hypoxia presenting with increased leg.  Denies any change to her chronic shortness of breath.  No chest pain or fever.  Catheterization in June showed normal ejection fraction and minimal CAD. {Document critical care time when appropriate:1} {Document review of labs and clinical decision tools ie heart score, Chads2Vasc2 etc:1}  {Document your independent review of radiology images, and any outside records:1} {Document your discussion with family members, caretakers, and with consultants:1} {Document social determinants of health affecting pt's care:1} {Document your decision making why or why not admission, treatments were needed:1} Final Clinical  Impression(s) / ED Diagnoses Final diagnoses:  None    Rx / DC Orders ED Discharge Orders     None

## 2022-04-20 NOTE — Telephone Encounter (Signed)
Patient stated her blood pressure has elevated: 10/04: 154/78, 94  8/29: 157/81 8/30: 158/77 8/31: 166/86 9/01: 142/69 This afternoon 154/78, P 94 Patient reports no increase in Na+ and is trying to stay hydrated (taking lasix). She reports that her left leg is edematous form the knee down with a rash along the leg. "It hurts." She states mid-calf pain and her skin is tight. She will send a photo on MyChart. I will call her back

## 2022-04-20 NOTE — Telephone Encounter (Signed)
Pt c/o BP issue: STAT if pt c/o blurred vision, one-sided weakness or slurred speech  1. What are your last 5 BP readings?  10/04: 154/78 94  8/29: 157/81 8/30: 158/77 8/31: 166/86 9/01: 142/69  2. Are you having any other symptoms (ex. Dizziness, headache, blurred vision, passed out)?  Leg swelling  3. What is your BP issue?  BP has been elevated    Pt c/o swelling: STAT is pt has developed SOB within 24 hours  How much weight have you gained and in what time span?  Patient assumes she has gained weight , unsure of how much in total  If swelling, where is the swelling located?  Legs and feet (mainly left leg)   Are you currently taking a fluid pill?  Furosemide 40 MG, once daily   Are you currently SOB?  No   Do you have a log of your daily weights (if so, list)?  199 lbs a few days ago Went up to 200 lbs the next day No additional readings available  Have you gained 3 pounds in a day or 5 pounds in a week?    Have you traveled recently?  No

## 2022-04-20 NOTE — ED Notes (Signed)
Pt. Refused to ambulate, "Says with the pain in her foot, I cannot stand." I do not want to risk a fall."

## 2022-04-20 NOTE — ED Triage Notes (Addendum)
Pt states leg swelling up to her knees. Pt has been taking lasix as prescribed. Pt endorses SHOB.   Pt wears 7L O2 at all times.

## 2022-04-20 NOTE — Telephone Encounter (Signed)
See phone note from today (04/20/22). After reviewing photos, recommended for patient to go to the ED. She stated she will go to the [facility] ED.

## 2022-04-20 NOTE — ED Notes (Signed)
Patient placed on salter [place] - 7 L. Patient on 7 L baseline per patient.

## 2022-04-21 ENCOUNTER — Observation Stay (HOSPITAL_BASED_OUTPATIENT_CLINIC_OR_DEPARTMENT_OTHER): Payer: Medicare Other

## 2022-04-21 DIAGNOSIS — L03116 Cellulitis of left lower limb: Secondary | ICD-10-CM | POA: Diagnosis present

## 2022-04-21 DIAGNOSIS — E876 Hypokalemia: Secondary | ICD-10-CM | POA: Diagnosis not present

## 2022-04-21 DIAGNOSIS — M069 Rheumatoid arthritis, unspecified: Secondary | ICD-10-CM | POA: Diagnosis not present

## 2022-04-21 DIAGNOSIS — I2510 Atherosclerotic heart disease of native coronary artery without angina pectoris: Secondary | ICD-10-CM | POA: Diagnosis not present

## 2022-04-21 DIAGNOSIS — I5032 Chronic diastolic (congestive) heart failure: Secondary | ICD-10-CM

## 2022-04-21 DIAGNOSIS — R609 Edema, unspecified: Secondary | ICD-10-CM | POA: Diagnosis present

## 2022-04-21 DIAGNOSIS — E785 Hyperlipidemia, unspecified: Secondary | ICD-10-CM | POA: Diagnosis not present

## 2022-04-21 DIAGNOSIS — I083 Combined rheumatic disorders of mitral, aortic and tricuspid valves: Secondary | ICD-10-CM | POA: Diagnosis not present

## 2022-04-21 DIAGNOSIS — E669 Obesity, unspecified: Secondary | ICD-10-CM | POA: Diagnosis not present

## 2022-04-21 DIAGNOSIS — J309 Allergic rhinitis, unspecified: Secondary | ICD-10-CM | POA: Diagnosis not present

## 2022-04-21 DIAGNOSIS — Z6839 Body mass index (BMI) 39.0-39.9, adult: Secondary | ICD-10-CM | POA: Diagnosis not present

## 2022-04-21 DIAGNOSIS — I272 Pulmonary hypertension, unspecified: Secondary | ICD-10-CM

## 2022-04-21 DIAGNOSIS — G4733 Obstructive sleep apnea (adult) (pediatric): Secondary | ICD-10-CM

## 2022-04-21 DIAGNOSIS — F341 Dysthymic disorder: Secondary | ICD-10-CM | POA: Diagnosis not present

## 2022-04-21 DIAGNOSIS — K219 Gastro-esophageal reflux disease without esophagitis: Secondary | ICD-10-CM | POA: Diagnosis not present

## 2022-04-21 DIAGNOSIS — K521 Toxic gastroenteritis and colitis: Secondary | ICD-10-CM | POA: Diagnosis not present

## 2022-04-21 DIAGNOSIS — J9611 Chronic respiratory failure with hypoxia: Secondary | ICD-10-CM

## 2022-04-21 DIAGNOSIS — Z7952 Long term (current) use of systemic steroids: Secondary | ICD-10-CM | POA: Diagnosis not present

## 2022-04-21 DIAGNOSIS — Z7960 Long term (current) use of unspecified immunomodulators and immunosuppressants: Secondary | ICD-10-CM | POA: Diagnosis not present

## 2022-04-21 DIAGNOSIS — R52 Pain, unspecified: Secondary | ICD-10-CM | POA: Diagnosis not present

## 2022-04-21 DIAGNOSIS — R7989 Other specified abnormal findings of blood chemistry: Secondary | ICD-10-CM

## 2022-04-21 DIAGNOSIS — L0390 Cellulitis, unspecified: Secondary | ICD-10-CM | POA: Diagnosis present

## 2022-04-21 DIAGNOSIS — J849 Interstitial pulmonary disease, unspecified: Secondary | ICD-10-CM

## 2022-04-21 DIAGNOSIS — Z79899 Other long term (current) drug therapy: Secondary | ICD-10-CM | POA: Diagnosis not present

## 2022-04-21 DIAGNOSIS — I2721 Secondary pulmonary arterial hypertension: Secondary | ICD-10-CM | POA: Diagnosis not present

## 2022-04-21 DIAGNOSIS — F419 Anxiety disorder, unspecified: Secondary | ICD-10-CM | POA: Diagnosis not present

## 2022-04-21 DIAGNOSIS — I5033 Acute on chronic diastolic (congestive) heart failure: Secondary | ICD-10-CM | POA: Diagnosis not present

## 2022-04-21 DIAGNOSIS — F32A Depression, unspecified: Secondary | ICD-10-CM | POA: Diagnosis not present

## 2022-04-21 DIAGNOSIS — L309 Dermatitis, unspecified: Secondary | ICD-10-CM | POA: Diagnosis not present

## 2022-04-21 DIAGNOSIS — I110 Hypertensive heart disease with heart failure: Secondary | ICD-10-CM | POA: Diagnosis not present

## 2022-04-21 DIAGNOSIS — R233 Spontaneous ecchymoses: Secondary | ICD-10-CM

## 2022-04-21 DIAGNOSIS — J9621 Acute and chronic respiratory failure with hypoxia: Secondary | ICD-10-CM | POA: Diagnosis not present

## 2022-04-21 DIAGNOSIS — D849 Immunodeficiency, unspecified: Secondary | ICD-10-CM | POA: Diagnosis not present

## 2022-04-21 DIAGNOSIS — R600 Localized edema: Secondary | ICD-10-CM

## 2022-04-21 DIAGNOSIS — D72829 Elevated white blood cell count, unspecified: Secondary | ICD-10-CM

## 2022-04-21 LAB — CBC WITH DIFFERENTIAL/PLATELET
Abs Immature Granulocytes: 0.07 10*3/uL (ref 0.00–0.07)
Basophils Absolute: 0 10*3/uL (ref 0.0–0.1)
Basophils Relative: 0 %
Eosinophils Absolute: 0.1 10*3/uL (ref 0.0–0.5)
Eosinophils Relative: 1 %
HCT: 38.5 % (ref 36.0–46.0)
Hemoglobin: 12.4 g/dL (ref 12.0–15.0)
Immature Granulocytes: 1 %
Lymphocytes Relative: 3 %
Lymphs Abs: 0.3 10*3/uL — ABNORMAL LOW (ref 0.7–4.0)
MCH: 31.9 pg (ref 26.0–34.0)
MCHC: 32.2 g/dL (ref 30.0–36.0)
MCV: 99 fL (ref 80.0–100.0)
Monocytes Absolute: 0.3 10*3/uL (ref 0.1–1.0)
Monocytes Relative: 3 %
Neutro Abs: 9.8 10*3/uL — ABNORMAL HIGH (ref 1.7–7.7)
Neutrophils Relative %: 92 %
Platelets: 138 10*3/uL — ABNORMAL LOW (ref 150–400)
RBC: 3.89 MIL/uL (ref 3.87–5.11)
RDW: 14.1 % (ref 11.5–15.5)
WBC: 10.6 10*3/uL — ABNORMAL HIGH (ref 4.0–10.5)
nRBC: 0 % (ref 0.0–0.2)

## 2022-04-21 LAB — C-REACTIVE PROTEIN: CRP: 0.5 mg/dL (ref <1.0)

## 2022-04-21 LAB — SEDIMENTATION RATE: Sed Rate: 2 mm/hr (ref 0–22)

## 2022-04-21 MED ORDER — ACETAMINOPHEN 650 MG RE SUPP: 650.0000 mg | Freq: Four times a day (QID) | RECTAL | Status: DC | PRN

## 2022-04-21 MED ORDER — FUROSEMIDE 10 MG/ML IJ SOLN
40.0000 mg | Freq: Once | INTRAMUSCULAR | Status: AC
  Administered 2022-04-21: 40 mg via INTRAVENOUS
  Filled 2022-04-21: qty 4

## 2022-04-21 MED ORDER — CILIDINIUM-CHLORDIAZEPOXIDE 2.5-5 MG PO CAPS: 1.0000 | ORAL_CAPSULE | Freq: Three times a day (TID) | ORAL | Status: DC | PRN

## 2022-04-21 MED ORDER — VERAPAMIL HCL ER 180 MG PO TBCR
300.0000 mg | EXTENDED_RELEASE_TABLET | Freq: Every day | ORAL | Status: DC
  Administered 2022-04-21 – 2022-05-03 (×13): 300 mg via ORAL
  Filled 2022-04-21 (×13): qty 1

## 2022-04-21 MED ORDER — MONTELUKAST SODIUM 10 MG PO TABS
10.0000 mg | ORAL_TABLET | Freq: Every day | ORAL | Status: DC
  Administered 2022-04-21 – 2022-05-02 (×12): 10 mg via ORAL
  Filled 2022-04-21 (×12): qty 1

## 2022-04-21 MED ORDER — MENTHOL (TOPICAL ANALGESIC) 4 % EX GEL: 1.0000 | Freq: Two times a day (BID) | CUTANEOUS | Status: DC | PRN

## 2022-04-21 MED ORDER — FLUTICASONE FUROATE-VILANTEROL 100-25 MCG/ACT IN AEPB
1.0000 | INHALATION_SPRAY | Freq: Every day | RESPIRATORY_TRACT | Status: DC
  Administered 2022-04-22 – 2022-05-03 (×12): 1 via RESPIRATORY_TRACT
  Filled 2022-04-21: qty 28

## 2022-04-21 MED ORDER — POLYVINYL ALCOHOL 1.4 % OP SOLN: 1.0000 [drp] | OPHTHALMIC | Status: DC | PRN

## 2022-04-21 MED ORDER — HYDROCODONE-ACETAMINOPHEN 5-325 MG PO TABS
1.0000 | ORAL_TABLET | ORAL | Status: DC | PRN
  Administered 2022-04-21 – 2022-05-03 (×45): 1 via ORAL
  Filled 2022-04-21 (×47): qty 1

## 2022-04-21 MED ORDER — ROSUVASTATIN CALCIUM 20 MG PO TABS
20.0000 mg | ORAL_TABLET | Freq: Every day | ORAL | Status: DC
  Administered 2022-04-21 – 2022-05-03 (×13): 20 mg via ORAL
  Filled 2022-04-21 (×13): qty 1

## 2022-04-21 MED ORDER — UMECLIDINIUM BROMIDE 62.5 MCG/ACT IN AEPB
1.0000 | INHALATION_SPRAY | Freq: Every day | RESPIRATORY_TRACT | Status: DC
  Administered 2022-04-22 – 2022-05-03 (×12): 1 via RESPIRATORY_TRACT
  Filled 2022-04-21 (×2): qty 7

## 2022-04-21 MED ORDER — MORPHINE SULFATE (PF) 2 MG/ML IV SOLN
2.0000 mg | INTRAVENOUS | Status: DC | PRN
  Administered 2022-04-21 – 2022-04-22 (×2): 2 mg via INTRAVENOUS
  Filled 2022-04-21 (×2): qty 1

## 2022-04-21 MED ORDER — IPRATROPIUM BROMIDE 0.06 % NA SOLN
1.0000 | Freq: Two times a day (BID) | NASAL | Status: DC
  Administered 2022-04-21 – 2022-05-03 (×22): 1 via NASAL
  Filled 2022-04-21: qty 15

## 2022-04-21 MED ORDER — COLESTIPOL HCL 1 G PO TABS
1.0000 g | ORAL_TABLET | Freq: Every day | ORAL | Status: DC
  Administered 2022-04-21 – 2022-05-02 (×12): 1 g via ORAL
  Filled 2022-04-21 (×13): qty 1

## 2022-04-21 MED ORDER — MUSCLE RUB 10-15 % EX CREA: TOPICAL_CREAM | CUTANEOUS | Status: DC | PRN

## 2022-04-21 MED ORDER — DICYCLOMINE HCL 20 MG PO TABS: 20.0000 mg | ORAL_TABLET | Freq: Four times a day (QID) | ORAL | Status: DC | PRN

## 2022-04-21 MED ORDER — PANTOPRAZOLE SODIUM 40 MG PO TBEC
40.0000 mg | DELAYED_RELEASE_TABLET | Freq: Every day | ORAL | Status: DC
  Administered 2022-04-21 – 2022-05-03 (×13): 40 mg via ORAL
  Filled 2022-04-21 (×13): qty 1

## 2022-04-21 MED ORDER — FLUCONAZOLE 150 MG PO TABS
150.0000 mg | ORAL_TABLET | Freq: Once | ORAL | Status: AC
  Administered 2022-04-21: 150 mg via ORAL
  Filled 2022-04-21: qty 1

## 2022-04-21 MED ORDER — LEFLUNOMIDE 20 MG PO TABS
20.0000 mg | ORAL_TABLET | Freq: Every day | ORAL | Status: DC
  Administered 2022-04-21 – 2022-04-27 (×7): 20 mg via ORAL
  Filled 2022-04-21 (×7): qty 1

## 2022-04-21 MED ORDER — TREPROSTINIL 64 MCG IN POWD
1.0000 | Freq: Four times a day (QID) | RESPIRATORY_TRACT | Status: DC
  Administered 2022-04-23 – 2022-04-25 (×8): 1 via RESPIRATORY_TRACT
  Filled 2022-04-21 (×2): qty 112

## 2022-04-21 MED ORDER — BENZONATATE 100 MG PO CAPS: 200.0000 mg | ORAL_CAPSULE | Freq: Three times a day (TID) | ORAL | Status: DC | PRN

## 2022-04-21 MED ORDER — FLUOXETINE HCL 20 MG PO CAPS
20.0000 mg | ORAL_CAPSULE | Freq: Every day | ORAL | Status: DC
  Administered 2022-04-21 – 2022-05-03 (×13): 20 mg via ORAL
  Filled 2022-04-21 (×13): qty 1

## 2022-04-21 MED ORDER — HYDROCODONE-ACETAMINOPHEN 5-325 MG PO TABS
1.0000 | ORAL_TABLET | Freq: Four times a day (QID) | ORAL | Status: DC | PRN
  Administered 2022-04-21: 1 via ORAL
  Filled 2022-04-21: qty 1

## 2022-04-21 MED ORDER — ALBUTEROL SULFATE (2.5 MG/3ML) 0.083% IN NEBU: 2.5000 mg | INHALATION_SOLUTION | RESPIRATORY_TRACT | Status: DC | PRN

## 2022-04-21 MED ORDER — HYDROCODONE-ACETAMINOPHEN 5-325 MG PO TABS
1.0000 | ORAL_TABLET | Freq: Once | ORAL | Status: AC
  Administered 2022-04-21: 1 via ORAL
  Filled 2022-04-21: qty 1

## 2022-04-21 MED ORDER — ACETAMINOPHEN 325 MG PO TABS
650.0000 mg | ORAL_TABLET | Freq: Four times a day (QID) | ORAL | Status: DC | PRN
  Administered 2022-05-01 – 2022-05-02 (×2): 650 mg via ORAL
  Filled 2022-04-21 (×2): qty 2

## 2022-04-21 MED ORDER — SODIUM CHLORIDE 0.9 % IV SOLN
1.0000 g | INTRAVENOUS | Status: DC
  Administered 2022-04-21 – 2022-04-24 (×4): 1 g via INTRAVENOUS
  Filled 2022-04-21 (×4): qty 10

## 2022-04-21 MED ORDER — ASPIRIN 81 MG PO TBEC
81.0000 mg | DELAYED_RELEASE_TABLET | Freq: Every day | ORAL | Status: DC
  Administered 2022-04-21 – 2022-05-03 (×13): 81 mg via ORAL
  Filled 2022-04-21 (×13): qty 1

## 2022-04-21 MED ORDER — SACCHAROMYCES BOULARDII 250 MG PO CAPS
250.0000 mg | ORAL_CAPSULE | Freq: Two times a day (BID) | ORAL | Status: DC
  Administered 2022-04-21 – 2022-05-03 (×24): 250 mg via ORAL
  Filled 2022-04-21 (×24): qty 1

## 2022-04-21 MED ORDER — VERAPAMIL HCL ER 300 MG PO CP24: 300.0000 mg | ORAL_CAPSULE | Freq: Every day | ORAL | Status: DC

## 2022-04-21 MED ORDER — FUROSEMIDE 40 MG PO TABS
40.0000 mg | ORAL_TABLET | Freq: Every day | ORAL | Status: DC
  Administered 2022-04-22 – 2022-04-23 (×2): 40 mg via ORAL
  Filled 2022-04-21 (×2): qty 1

## 2022-04-21 MED ORDER — PREDNISONE 5 MG PO TABS
5.0000 mg | ORAL_TABLET | Freq: Every day | ORAL | Status: DC
  Administered 2022-04-21 – 2022-05-03 (×13): 5 mg via ORAL
  Filled 2022-04-21 (×14): qty 1

## 2022-04-21 MED ORDER — PIRFENIDONE 801 MG PO TABS
801.0000 mg | ORAL_TABLET | Freq: Three times a day (TID) | ORAL | Status: DC
  Administered 2022-04-23 – 2022-05-03 (×29): 801 mg via ORAL
  Filled 2022-04-21 (×29): qty 1

## 2022-04-21 MED ORDER — LORATADINE 10 MG PO TABS
10.0000 mg | ORAL_TABLET | Freq: Every day | ORAL | Status: DC
  Administered 2022-04-21 – 2022-05-03 (×13): 10 mg via ORAL
  Filled 2022-04-21 (×13): qty 1

## 2022-04-21 MED ORDER — FENTANYL CITRATE PF 50 MCG/ML IJ SOSY
50.0000 ug | PREFILLED_SYRINGE | Freq: Once | INTRAMUSCULAR | Status: AC
  Administered 2022-04-21: 50 ug via INTRAVENOUS
  Filled 2022-04-21: qty 1

## 2022-04-21 MED ORDER — CELECOXIB 200 MG PO CAPS
200.0000 mg | ORAL_CAPSULE | Freq: Every day | ORAL | Status: DC
  Administered 2022-04-22 – 2022-04-23 (×2): 200 mg via ORAL
  Filled 2022-04-21 (×2): qty 1

## 2022-04-21 MED ORDER — IPRATROPIUM-ALBUTEROL 0.5-2.5 (3) MG/3ML IN SOLN: 3.0000 mL | Freq: Four times a day (QID) | RESPIRATORY_TRACT | Status: DC | PRN

## 2022-04-21 MED ORDER — TRAZODONE HCL 100 MG PO TABS
100.0000 mg | ORAL_TABLET | Freq: Every day | ORAL | Status: DC
  Administered 2022-04-21 – 2022-05-02 (×12): 100 mg via ORAL
  Filled 2022-04-21 (×12): qty 1

## 2022-04-21 MED ORDER — FLUTICASONE PROPIONATE 50 MCG/ACT NA SUSP
2.0000 | Freq: Every day | NASAL | Status: DC
  Administered 2022-04-21 – 2022-05-03 (×13): 2 via NASAL
  Filled 2022-04-21: qty 16

## 2022-04-21 MED ORDER — POTASSIUM CHLORIDE CRYS ER 20 MEQ PO TBCR
20.0000 meq | EXTENDED_RELEASE_TABLET | Freq: Two times a day (BID) | ORAL | Status: DC
  Administered 2022-04-21 – 2022-04-22 (×2): 20 meq via ORAL
  Filled 2022-04-21 (×2): qty 1

## 2022-04-21 MED ORDER — POLYETHYL GLYCOL-PROPYL GLYCOL 0.4-0.3 % OP GEL: 2.0000 | Freq: Two times a day (BID) | OPHTHALMIC | Status: DC | PRN

## 2022-04-21 MED ORDER — ENOXAPARIN SODIUM 40 MG/0.4ML IJ SOSY
40.0000 mg | PREFILLED_SYRINGE | INTRAMUSCULAR | Status: DC
  Administered 2022-04-21 – 2022-05-03 (×13): 40 mg via SUBCUTANEOUS
  Filled 2022-04-21 (×11): qty 0.4

## 2022-04-21 MED ORDER — METHOCARBAMOL 500 MG PO TABS
750.0000 mg | ORAL_TABLET | Freq: Two times a day (BID) | ORAL | Status: DC | PRN
  Administered 2022-04-21 – 2022-05-02 (×5): 750 mg via ORAL
  Filled 2022-04-21 (×5): qty 2

## 2022-04-21 MED ORDER — SODIUM CHLORIDE 0.9% FLUSH
3.0000 mL | Freq: Two times a day (BID) | INTRAVENOUS | Status: DC
  Administered 2022-04-21 – 2022-05-03 (×23): 3 mL via INTRAVENOUS

## 2022-04-21 NOTE — Telephone Encounter (Signed)
I see that Ms. Vanmaanen is still at [facility], awaiting admission.  I agree with careful diuresis.  Consultation with the inpatient cardiology and/or heart failure team should be considered.  Nelva Bush, MD [facility]

## 2022-04-21 NOTE — H&P (Signed)
History and Physical    Patient: [name]:[id number] DOB: [birth year] DOA: 04/20/2022 DOS: the patient was seen and examined on 04/21/2022 PCP: Lorene Dy, MD  Patient coming from: [place] from [place]:  Chief Complaint  Patient presents with   Leg Swelling   Shortness of Breath   HPI: [name] is a 76 y.o. female with medical history significant of hypertension, HFpEF, asthmatic bronchitis, PAH, ILD, chronic respiratory failure on 7 L of oxygen, rheumatoid arthritis on chronic immunosuppression, and OSA presents with complaints of worsening leg swelling over the last 5 days.  Reported increasing pain and swelling in her legs up to her knees with the left being affected more than the right.  She has been taking her Lasix as per prescribed, but did not appear to be working like usual.  She reported having itchiness of the left leg and over the last 2 days developed reddness of the shin.  She notes that the leg has been weeping some fluid.  She is had some subjective fevers at night.  Patient chronically has a cough that she reports is unchanged.  Patient is not on blood thinners and denies any prior history of clots.  Family had sent pictures to her cardiologist who recommended her to come to the hospital for further evaluation.  Patient reports that her dose of verapamil was increased to 300 mg daily on her last appointment with Dr. Saunders Revel of cardiology on 8/23 and she had been placed on this to [facility]  Upon admission to the emergency department patient was noted to be afebrile with tachypnea and O2 saturation noted to be as low as 89% with improvement on 7 L of high flow nasal cannula oxygen.  Venous blood gas noted pH 7.347, PCO2 57.2, and PO2 42.  Labs from 10/4 noted to see 11.6, BNP 36, high-sensitivity troponin 21 ->22, and BNP 36.  Chest x-ray showed no acute cardiopulmonary disease.  Patient had been given furosemide 40 mg IV x1 dose and pain  medications.  Review of Systems: As mentioned in the history of present illness. All other systems reviewed and are negative. Past Medical History:  Diagnosis Date   Acute asthmatic bronchitis    Allergic rhinitis    Anxiety    Arthritis    Esophageal reflux    Hypertension    Interstitial lung disease ([place]) dx jan 2020   Irritable bowel syndrome    Oxygen dependent    4 liters day time 6 liters at night   PONV (postoperative nausea and vomiting)    ponv likes zofran, and scopolamine patch   Pulmonary hypertension ([place])    Rheumatoid arthritis(714.0)    Sleep apnea    Past Surgical History:  Procedure Laterality Date    c secttion  1977   2 foot fusions Left    total of 6 left foot sx   ABDOMINAL HYSTERECTOMY     complete    ANKLE FUSION  2009   left   BACK SURGERY     lower l to l 5 fused   CHOLECYSTECTOMY     RIGHT HEART CATH N/A 11/09/2020   Procedure: RIGHT HEART CATH;  Surgeon: Jolaine Artist, MD;  Location: [place] [facility];  Service: Cardiovascular;  Laterality: N/A;   RIGHT/LEFT HEART CATH AND CORONARY ANGIOGRAPHY N/A 09/16/2016   Procedure: Right/Left Heart Cath and Coronary Angiography;  Surgeon: Nelva Bush, MD;  Location: [facility];  Service: Cardiovascular;  Laterality: N/A;   RIGHT/LEFT HEART CATH AND CORONARY ANGIOGRAPHY N/A 12/17/2021   Procedure: RIGHT/LEFT HEART CATH AND CORONARY ANGIOGRAPHY;  Surgeon: Jolaine Artist, MD;  Location: [place] [facility];  Service: Cardiovascular;  Laterality: N/A;   TONSILLECTOMY     TOTAL HIP ARTHROPLASTY Right 12/20/2018   Procedure: TOTAL HIP ARTHROPLASTY ANTERIOR APPROACH;  Surgeon: Paralee Cancel, MD;  Location: [facility];  Service: Orthopedics;  Laterality: Right;  70 mins   TOTAL KNEE ARTHROPLASTY Bilateral    Social History:  reports that she has never smoked. She has never used smokeless tobacco. She reports that she does not currently use alcohol after a past usage of about 7.0 standard drinks of  alcohol per week. She reports that she does not use drugs.  Allergies  Allergen Reactions   Cefdinir Diarrhea   Erythromycin Base Diarrhea   Lactose Dermatitis   Lactulose Diarrhea   Mesalamine Nausea Only   Methotrexate Other (See Comments)    Felt sick   Nitrofuran Derivatives     shakiness   Other Diarrhea and Other (See Comments)    Shaking uncontrollably "lettuce only" "lettuce only"   Sulfa Antibiotics Other (See Comments)    Patient can't recall reaction    Tdap [Tetanus-Diphth-Acell Pertussis]     Shaking uncontrollably   Tetanus Toxoid, Adsorbed Other (See Comments)    Shaking uncontrollable     Family History  Problem Relation Age of Onset   Heart disease Mother    Arthritis Mother    Heart attack Father    Diabetes Other        sibling   Heart attack Other        sibling    Prior to Admission medications   Medication Sig Start Date End Date Taking? Authorizing Provider  albuterol (VENTOLIN HFA) 108 (90 Base) MCG/ACT inhaler USE 2 INHALATIONS BY MOUTH  EVERY 6 HOURS AS NEEDED 01/10/22   Brand Males, MD  aspirin EC 81 MG tablet Take 81 mg by mouth daily.    [provider]  benzonatate (TESSALON) 200 MG capsule TAKE 1 CAPSULE (200 MG TOTAL) BY MOUTH 3 (THREE) TIMES DAILY AS NEEDED FOR COUGH. 04/07/22   Brand Males, MD  celecoxib (CELEBREX) 200 MG capsule Take 200 mg by mouth daily. 03/30/20   [provider]  cetirizine (ZYRTEC) 10 MG tablet Take 20 mg by mouth daily.    [provider]  Cholecalciferol (VITAMIN D3) 250 MCG (10000 UT) capsule Take 10,000 Units by mouth daily.    [provider]  clidinium-chlordiazePOXIDE (LIBRAX) 5-2.5 MG capsule Take 1 capsule by mouth 3 (three) times daily as needed (for IBS).    [provider]  colestipol (COLESTID) 1 g tablet Take 1 g by mouth daily.    [provider]  dicyclomine (BENTYL) 20 MG tablet Take 20 mg by mouth 4 (four) times daily as needed (IBS).     [provider]  FLUoxetine (PROZAC) 20 MG capsule Take 20 mg by mouth daily. 08/11/16   [provider]  fluticasone (FLONASE) 50 MCG/ACT nasal spray SPRAY 2 SPRAYS INTO EACH NOSTRIL EVERY DAY 04/01/20   Lauraine Rinne, NP  furosemide (LASIX) 40 MG tablet TAKE 1 TABLET BY MOUTH DAILY 03/03/22   End, Harrell Gave, MD  ipratropium (ATROVENT) 0.03 % nasal spray Use 1-2 sprays in both nostrils twice daily. 07/14/21   Brand Males, MD  ipratropium-albuterol (DUONEB) 0.5-2.5 (3) MG/3ML SOLN USE 1 VIAL IN NEBULIZER EVERY 6 HOURS AS NEEDED  J45.901 08/17/21   Brand Males, MD  leflunomide (ARAVA) 20 MG tablet Take 20 mg by mouth daily.  03/24/16   [provider]  methocarbamol (ROBAXIN) 750 MG tablet Take 750 mg by mouth 2 (two) times daily. As needed 07/22/20   [provider]  montelukast (SINGULAIR) 10 MG tablet Take 1 tablet (10 mg total) by mouth at bedtime. 07/14/21   Brand Males, MD  omeprazole (PRILOSEC) 40 MG capsule Take 40 mg by mouth at bedtime.     [provider]  OXYGEN Inhale 7 L into the lungs continuous.    [provider]  Pirfenidone (ESBRIET) 801 MG TABS Take 801 mg by mouth 3 (three) times daily. 07/15/21   Brand Males, MD  Polyethyl Glycol-Propyl Glycol (SYSTANE) 0.4-0.3 % GEL ophthalmic gel Place 2 application. into both eyes at bedtime.    [provider]  Polyethyl Glycol-Propyl Glycol (SYSTANE) 0.4-0.3 % SOLN Place 2 drops into both eyes daily as needed (Dry eyes).    [provider]  potassium chloride SA (KLOR-CON M20) 20 MEQ tablet Take 1 tablet (20 mEq total) by mouth 2 (two) times daily. 03/03/22   End, Harrell Gave, MD  predniSONE (DELTASONE) 5 MG tablet Take 5 mg by mouth daily with breakfast.    [provider]  rosuvastatin (CRESTOR) 20 MG tablet Take 1 tablet (20 mg total) by mouth daily. 03/09/22   End, Harrell Gave, MD  Tocilizumab (ACTEMRA IV) Inject into the vein every 30  (thirty) days. infusion    [provider]  traMADol (ULTRAM) 50 MG tablet Take 50 mg by mouth 3 (three) times daily as needed for moderate pain. Can take up to 4 pills a day as needed. 07/03/20   [provider]  traZODone (DESYREL) 100 MG tablet Take 100 mg by mouth at bedtime.    [provider]  Donnal Debar 100-62.5-25 MCG/ACT AEPB USE 1 INHALATION BY MOUTH DAILY 12/03/21   Brand Males, MD  Treprostinil (TYVASO DPI TITRATION KIT) 16 & 32 & 48 MCG POWD Inhale into the lungs in the morning, at noon, in the evening, and at bedtime.    [provider]  Verapamil HCl CR 300 MG CP24 Take 1 capsule (300 mg total) by mouth daily. 03/09/22 09/05/22  Nelva Bush, MD    Physical Exam: Vitals:   04/21/22 0215 04/21/22 0500 04/21/22 0700 04/21/22 0901  BP:  (!) 168/85 (!) 148/85 (!) 148/76  Pulse: 86 98 95 99  Resp: (!) 21 (!) 27 (!) 29 20  Temp:   99 F (37.2 C) 98.7 F (37.1 C)  TempSrc:    Oral  SpO2: 99% 98% 96% 96%  Weight:    91.2 kg  Height:    5' (1.524 m)    Constitutional: NAD, calm, comfortable Eyes: PERRL, lids and conjunctivae normal ENMT: Mucous membranes are moist. Posterior pharynx clear of any exudate or lesions.Normal dentition.  Neck: normal, supple, no masses, no thyromegaly Respiratory: Creased overall aeration with some expiratory crackles appreciated. Cardiovascular: Regular rate and rhythm, gallops.  1+ swelling of the bilateral lower extremities with left noted to be worse than the right.. No carotid bruits.  Abdomen: no tenderness, no masses palpated. Bowel sounds positive.  Musculoskeletal:  Joint deformity of the left ankle. Skin: Red petechial-like rash of the left leg with increased warmth present  Neurologic: CN 2-12 grossly intact.  Strength 5/5 in all 4.  Psychiatric: Normal judgment and insight. Alert and oriented x 3. Normal mood.  Data Reviewed:  Reviewed labs, imaging and pertinent records as noted  above in HPI  Assessment and Plan:  Lower extremity edema petechial rash and/or possible cellulitis Acute.  Patient noted to have bilateral leg swelling with redness noted on the left leg.  On exam she appears to have a petechial rash of the left leg, but has increased warmth to the touch.  Question possibility of  cellulitis given patient's immunocompromised status. -Admit to a telemetry bed -Check ESR and CRP -Check Doppler ultrasound of the lower extremities -Start empiric antibiotics of Rocephin IV -Hydrocodone as needed for pain -Wound care consult   Chronic respiratory failure with hypoxia interstitial lung disease Patient has history of chronic respiratory failure on 7 L of oxygen at baseline.  Chest x-ray did not note any acute abnormality.  Followed by [name] in the outpatient setting.  She is on a trial medication Tyvaso. -Continue nasal cannula oxygen at 7 L -Continue home medication regimen of pharmacy substitution of Trelegy, pirfenidone,cetirizine, Flonase, Singulair, pharmacy substitution of omeprazole, DuoNebs as needed for shortness of breath/wheezing -Continue outpatient follow-up with [name]  Leukocytosis Acute.  WBC was elevated at 11.6-> 10.5. -Follow- up ESR and CRP  Elevated troponin Acute.  High-sensitivity troponin 21-> 22.  Patient had underwent right and left heart cath on 12/17/2021 with Dr. Haroldine Laws which noted mild to moderate nonobstructive CAD( mid LAD lesion 40% stenosed and proximal RCA lesion 20% stenosed).  Suspect secondary to demand in setting of patient initial respiratory distress. -Continue aspirin and statin  Heart failure with preserved EF Chronic.  Patient noted to have BNP within normal limits.  She does not appear to be overtly fluid overloaded at this time.  Last echocardiogram noted preserved EF with grade 1 diastolic dysfunction, mild aortic regurgitation, mild mitral regurgitation, and mild to moderate tricuspid  regurgitation.  She had been given Lasix 40 mg IV x1 dose in the ED. -Given additional Lasix 40 mg IV x1 dose, then resume Lasix 40 mg daily p.o. -Consider need to formally consult cardiology, but does not appear to be in acute exacerbation of heart failure  Pulmonary artery hypertension Noted to have mild to moderate pulmonary artery hypertension with elevated PVR by heart cath 12/2021.  Patient is on Tyvaso. -Continue Tyvasco  Essential hypertension On admission blood pressures elevated up to 169/75.  Her dose of verapamil had recently been increased from 240 to 300 mg daily in August. -Continue verapamil  CAD Patient with nonobstructive disease by heart cath in June -Continue aspirin and statin  Anxiety and depression -Continue Prozac  Rheumatoid arthritis Patient is on Actemra IV in the outpatient setting and last received a dose on 9/18. -Continue prednisone and leflunomide  Hyperlipidemia -Continue Crestor  OSA on CPAP -Continue CPAP at night  DVT prophylaxis: Lovenox Advance Care Planning:   Code Status: Full Code   Consults: none  Family Communication: Family updated at bedside Severity of Illness: The appropriate patient status for this patient is OBSERVATION. Observation status is judged to be reasonable and necessary in order to provide the required intensity of service to ensure the patient's safety. The patient's presenting symptoms, physical exam findings, and initial radiographic and laboratory data in the context of their medical condition is felt to place them at decreased risk for further clinical deterioration. Furthermore, it is anticipated that the patient will be medically stable for discharge from the hospital within 2 midnights of admission.   Author: Norval Morton, MD 04/21/2022 9:29 AM  For  on call review [email or web address].

## 2022-04-21 NOTE — Progress Notes (Signed)
RT note: Pt. attempting to make arrangements with family to have home CPAP brought in to use during admission.

## 2022-04-21 NOTE — ED Notes (Signed)
Patient unable to tolerate CPAP. Patient placed on 7L [place] at this time. SpO2 96%. CPAP at bedside.

## 2022-04-21 NOTE — Progress Notes (Signed)
VASCULAR LAB    Bilateral lower extremity venous duplex has been performed.  See CV proc for preliminary results.   Traver Meckes, RVT 04/21/2022, 11:34 AM

## 2022-04-21 NOTE — Progress Notes (Signed)
Patients home CPAP set up at bedside with 7 Lpm O2 bled in. Patient stated she could place on herself when ready and no assistance was needed.

## 2022-04-22 DIAGNOSIS — J849 Interstitial pulmonary disease, unspecified: Secondary | ICD-10-CM | POA: Diagnosis present

## 2022-04-22 DIAGNOSIS — Z79899 Other long term (current) drug therapy: Secondary | ICD-10-CM | POA: Diagnosis not present

## 2022-04-22 DIAGNOSIS — K521 Toxic gastroenteritis and colitis: Secondary | ICD-10-CM | POA: Diagnosis not present

## 2022-04-22 DIAGNOSIS — I2721 Secondary pulmonary arterial hypertension: Secondary | ICD-10-CM | POA: Diagnosis present

## 2022-04-22 DIAGNOSIS — I110 Hypertensive heart disease with heart failure: Secondary | ICD-10-CM | POA: Diagnosis present

## 2022-04-22 DIAGNOSIS — I083 Combined rheumatic disorders of mitral, aortic and tricuspid valves: Secondary | ICD-10-CM | POA: Diagnosis present

## 2022-04-22 DIAGNOSIS — D849 Immunodeficiency, unspecified: Secondary | ICD-10-CM | POA: Diagnosis present

## 2022-04-22 DIAGNOSIS — Z6839 Body mass index (BMI) 39.0-39.9, adult: Secondary | ICD-10-CM | POA: Diagnosis not present

## 2022-04-22 DIAGNOSIS — E876 Hypokalemia: Secondary | ICD-10-CM

## 2022-04-22 DIAGNOSIS — Z7952 Long term (current) use of systemic steroids: Secondary | ICD-10-CM | POA: Diagnosis not present

## 2022-04-22 DIAGNOSIS — J309 Allergic rhinitis, unspecified: Secondary | ICD-10-CM | POA: Diagnosis present

## 2022-04-22 DIAGNOSIS — E785 Hyperlipidemia, unspecified: Secondary | ICD-10-CM | POA: Diagnosis present

## 2022-04-22 DIAGNOSIS — I272 Pulmonary hypertension, unspecified: Secondary | ICD-10-CM | POA: Diagnosis not present

## 2022-04-22 DIAGNOSIS — I5033 Acute on chronic diastolic (congestive) heart failure: Secondary | ICD-10-CM

## 2022-04-22 DIAGNOSIS — K219 Gastro-esophageal reflux disease without esophagitis: Secondary | ICD-10-CM | POA: Diagnosis present

## 2022-04-22 DIAGNOSIS — R609 Edema, unspecified: Secondary | ICD-10-CM | POA: Diagnosis present

## 2022-04-22 DIAGNOSIS — G4733 Obstructive sleep apnea (adult) (pediatric): Secondary | ICD-10-CM | POA: Diagnosis present

## 2022-04-22 DIAGNOSIS — F419 Anxiety disorder, unspecified: Secondary | ICD-10-CM | POA: Diagnosis present

## 2022-04-22 DIAGNOSIS — E669 Obesity, unspecified: Secondary | ICD-10-CM | POA: Diagnosis present

## 2022-04-22 DIAGNOSIS — L309 Dermatitis, unspecified: Secondary | ICD-10-CM | POA: Diagnosis present

## 2022-04-22 DIAGNOSIS — F341 Dysthymic disorder: Secondary | ICD-10-CM | POA: Diagnosis not present

## 2022-04-22 DIAGNOSIS — Z7960 Long term (current) use of unspecified immunomodulators and immunosuppressants: Secondary | ICD-10-CM | POA: Diagnosis not present

## 2022-04-22 DIAGNOSIS — I2510 Atherosclerotic heart disease of native coronary artery without angina pectoris: Secondary | ICD-10-CM | POA: Diagnosis present

## 2022-04-22 DIAGNOSIS — F32A Depression, unspecified: Secondary | ICD-10-CM | POA: Diagnosis present

## 2022-04-22 DIAGNOSIS — M069 Rheumatoid arthritis, unspecified: Secondary | ICD-10-CM | POA: Diagnosis present

## 2022-04-22 DIAGNOSIS — J9621 Acute and chronic respiratory failure with hypoxia: Secondary | ICD-10-CM | POA: Diagnosis present

## 2022-04-22 DIAGNOSIS — L03116 Cellulitis of left lower limb: Secondary | ICD-10-CM | POA: Diagnosis present

## 2022-04-22 LAB — BASIC METABOLIC PANEL
Anion gap: 8 (ref 5–15)
BUN: 18 mg/dL (ref 8–23)
CO2: 32 mmol/L (ref 22–32)
Calcium: 9.3 mg/dL (ref 8.9–10.3)
Chloride: 95 mmol/L — ABNORMAL LOW (ref 98–111)
Creatinine, Ser: 0.9 mg/dL (ref 0.44–1.00)
GFR, Estimated: >60 mL/min (ref >60)
Glucose, Bld: 139 mg/dL — ABNORMAL HIGH (ref 70–99)
Potassium: 3.1 mmol/L — ABNORMAL LOW (ref 3.5–5.1)
Sodium: 135 mmol/L (ref 135–145)

## 2022-04-22 LAB — CBC
HCT: 36.1 % (ref 36.0–46.0)
Hemoglobin: 11.7 g/dL — ABNORMAL LOW (ref 12.0–15.0)
MCH: 31.8 pg (ref 26.0–34.0)
MCHC: 32.4 g/dL (ref 30.0–36.0)
MCV: 98.1 fL (ref 80.0–100.0)
Platelets: 125 10*3/uL — ABNORMAL LOW (ref 150–400)
RBC: 3.68 MIL/uL — ABNORMAL LOW (ref 3.87–5.11)
RDW: 14.1 % (ref 11.5–15.5)
WBC: 11.1 10*3/uL — ABNORMAL HIGH (ref 4.0–10.5)
nRBC: 0 % (ref 0.0–0.2)

## 2022-04-22 MED ORDER — POTASSIUM CHLORIDE CRYS ER 20 MEQ PO TBCR
40.0000 meq | EXTENDED_RELEASE_TABLET | Freq: Two times a day (BID) | ORAL | Status: AC
  Administered 2022-04-22: 40 meq via ORAL
  Filled 2022-04-22: qty 2

## 2022-04-22 MED ORDER — POTASSIUM CHLORIDE CRYS ER 20 MEQ PO TBCR: 40.0000 meq | EXTENDED_RELEASE_TABLET | Freq: Two times a day (BID) | ORAL | Status: DC

## 2022-04-22 NOTE — Progress Notes (Signed)
Patients home CPAP at bedside within patients reach with 7 L O2 bled in. Patient stated she could place on herself when ready and no assistance was needed.

## 2022-04-22 NOTE — Assessment & Plan Note (Addendum)
Chronic hypoxemic respiratory failure.   Patient is oxygenation 98% on 7 L/min per HFNC (baseline needs 7 L/min).  Continue bronchodilator therapy Continue systemic steroids with prednisone 5 mg daily.  On perifenidone.

## 2022-04-22 NOTE — Consult Note (Signed)
[place] Nurse Consult Note: Patient receiving care in [place]. Reason for Consult: "rash of left leg" Wound type: unclear etiology I have shared the following information with Dr. Cathlean Sauer via telephone.  There is erythema and warmth, as well as scattered grey/blackish areas of necrosis in various spots on the LLE.  The warmth and erythema extends to the knee.  It is very tender to touch.  There is light erythema and a bit of warmth that is extending along the lateral aspect of the left thigh as well.    When I questioned the patient about any type of trauma that might have occurred to the leg recently, she told me the ONLY thing that has happened out of the ordinary is that Monday night she realized when she got out of the shower she had forgotten to shave her left leg. Her husband offered, and she agreed, to him taking his electric razor and shaved her leg to the level of the knee.  He seemed to press a bit harder than she would have.  Wednesday morning she woke up with a red and painful leg.   I relayed this information to Dr. Cathlean Sauer and expressed my concern that she might have a strep infection and perhaps we could consider consulting Infectious Diseases for their input.  THE PATIENT DOES NOT WANT THE SHAVING WITH THE SPOUSE'S RAZOR DISCUSSED IN FRONT OF HIM.  She is concerned he might feel bad if that contributed to her condition, and perhaps he might feel like she is blaming him.  This was also relayed to Dr. Cathlean Sauer.  Not topical therapy needed at this time.  Monitor the wound area(s) for worsening of condition such as: Signs/symptoms of infection,  Increase in size,  Development of or worsening of odor, Development of pain, or increased pain at the affected locations.  Notify the medical team if any of these develop.  Thank you for the consult.  [facility] nurse will not follow at this time.  Please re-consult the [facility] [place] [facility] team if needed.  Val Riles, RN, MSN, CWOCN, CNS-BC, pager [phone]

## 2022-04-22 NOTE — Hospital Course (Addendum)
Mrs. [name] was admitted to the hospital with the working diagnosis of decompensated heart failure complicated with lower extremity cellulitis.   76 yo female with the past medical history of hypertension, asthma, ILD, RA, heart failure and chronic hypoxemic respiratory failure who presented with dyspnea and lower extremity edema. Reported 5 days of progressive and worsening, lower extremity edema. 2 days of left left erythema and tenderness, along with subjective fevers. Family contacted her cardiologist and she was advices to come to the hospital. On her initial physical examination her blood pressure was 168/85, HR 95, RR 27 and 02 saturation 89%, lungs with rales bilaterally, heart with S1 and S2 present and rhythmic, abdomen with no distention, and positive lower extremity edema, left leg with erythema up to the knee.   Na 134, K 3,6 CL 99, bicarbonate 30 glucose 145, bun 23 cr 0,94  High sensitive troponin 21 and 22  Wbc 11,6 hgb 12.3 plt 180   Chest radiograph with cardiomegaly, mild hilar vascular congestion, no infiltrates or effusions.   EKG 96 bpm, left axis deviation, normal intervals, sinus rhythm with no significant ST segment or T wave changes.   Patient was placed on antibiotic therapy for cellulitis.  IV furosemide for diuresis.   10/07 cellulitis is improving clinically.  10/08 continue to improve cellulitis.  10/09 cellulitis with purulence added IV vancomycin  10/10 continue worsening cellulitis with areas of necrosis, consulted ID.

## 2022-04-22 NOTE — Assessment & Plan Note (Signed)
Calculated BMI is 39,6 consistent with obesity class 2.

## 2022-04-22 NOTE — Assessment & Plan Note (Signed)
Continue with treprostinil and verapamil.

## 2022-04-22 NOTE — Assessment & Plan Note (Signed)
Continue withy oral prednisone.

## 2022-04-22 NOTE — Assessment & Plan Note (Signed)
Continue with statin therapy.  ?

## 2022-04-22 NOTE — Progress Notes (Signed)
Heart Failure Navigator Progress Note  Assessed for Heart & Vascular TOC clinic readiness.  Patient does not meet criteria due to established patient of Advanced Heart Failure Team, Dr. Haroldine Laws.    Earnestine Leys, BSN, Clinical cytogeneticist Only

## 2022-04-22 NOTE — Progress Notes (Signed)
Progress Note   Patient: [name] NGE:[id number] DOB: 1945-07-27 DOA: 04/20/2022     0 DOS: the patient was seen and examined on 04/22/2022   Brief hospital course: Mrs. Cypert was admitted to the hospital with the working diagnosis of decompensated heart failure complicated with lower extremity cellulitis.   76 yo female with the past medical history of hypertension, asthma, ILD, RA, heart failure and chronic hypoxemic respiratory failure who presented with dyspnea and lower extremity edema. Reported 5 days of progressive and worsening, lower extremity edema. 2 days of left left erythema and tenderness, along with subjective fevers. Family contacted her cardiologist and she was advices to come to the hospital. On her initial physical examination her blood pressure was 168/85, HR 95, RR 27 and 02 saturation 89%, lungs with rales bilaterally, heart with S1 and S2 present and rhythmic, abdomen with no distention, and positive lower extremity edema, left leg with erythema up to the knee.   Na 134, K 3,6 CL 99, bicarbonate 30 glucose 145, bun 23 cr 0,94  High sensitive troponin 21 and 22  Wbc 11,6 hgb 12.3 plt 180   Chest radiograph with cardiomegaly, mild hilar vascular congestion, no infiltrates or effusions.   EKG 96 bpm, left axis deviation, normal intervals, sinus rhythm with no significant ST segment or T wave changes.   Patient was placed on antibiotic therapy for cellulitis.  IV furosemide for diuresis.       Assessment and Plan: * Cellulitis Patient with leukocytosis 11,1  Has been afebrile.  No clinical signs of sepsis (sepsis ruled out).   Plan to continue antibiotic therapy with IV ceftriaxone.  If not improving will add IV Vancomycin.  Continue analgesics for pain control.     Acute on chronic diastolic CHF (congestive heart failure) ([place]) Patient with improved volume status.   Plan to continue diuresis with oral furosemide 40 mg. Continue blood pressure control.    ILD (interstitial lung disease) ([place]) Chronic hypoxemic respiratory failure.   Patient is oxygenation 98% on 7 L/min per HFNC Continue bronchodilator therapy Continue systemic steroids with prednisone 5 mg daily.  On perifenidone.   Hyperlipidemia LDL goal <70 Continue with statin therapy.   ANXIETY DEPRESSION Continue with fluoxetine and trazodone.   Pulmonary hypertension ([place]) Continue with treprostinil and verapamil.   Obese Calculated BMI is 39,6 consistent with obesity class 2.   Rheumatoid arthritis ([facility]) Continue withy oral prednisone.   Hypokalemia Renal function with serum cr at 0,90 with K at 3,1 and serum bicarbonate at 32. Plan to continue K correction with Kcl Follow up renal function and electrolytes in am.         Subjective: Patient with pain in her left lower extremity controlled with analgesics, rash has improved per patient.   Physical Exam: Vitals:   04/22/22 0400 04/22/22 0747 04/22/22 0748 04/22/22 1103  BP: (!) 145/74 (!) 144/76  (!) 159/79  Pulse: 67 81 81 87  Resp: 16 20 (!) 24 18  Temp: 98.4 F (36.9 C) 98.7 F (37.1 C)    TempSrc: Axillary Oral    SpO2: 96% 98% 95% 95%  Weight: 92.1 kg     Height:       Neurology awake and alert ENT with mild pallor Cardiovascular with S1 and S2 present and rhythmic with no gallops rubs or murmurs Respiratory with mild rales with no wheezing Abdomen with no distention  Right lower extremity with trace edema, left with erythema at the level of the leg  with no clinical signs of joint inflammation.  Data Reviewed:  There are no new results to review at this time.  Family Communication: I spoke with patient's husband at the bedside, we talked in detail about patient's condition, plan of care and prognosis and all questions were addressed.   Disposition: Status is: Inpatient Remains inpatient appropriate because: left leg cellulitis   Planned Discharge Destination:  Home    Author: Tawni Millers, MD 04/22/2022 4:31 PM  For on call review [email or web address].

## 2022-04-22 NOTE — Assessment & Plan Note (Addendum)
Today renal function with serum cr at 1,17 with K at 3,8 and serum bicarbonate at 33.  Plan to continue diuresis with IV furosemide and add 40 meq Kcl today to prevent hypokalemia.  Follow up renal function and electrolytes in am.

## 2022-04-22 NOTE — Assessment & Plan Note (Addendum)
Echocardiogram with preserved LV systolic function with EF 55 to 60%, RV systolic function preserved. No significant valvular disease. Trivial pericardial effusion.   No SGLT 2 ing due to skin infection.  Her volume status has improved, transition to oral furosemide with good toleration. Continue blood pressure monitoring.

## 2022-04-22 NOTE — Assessment & Plan Note (Signed)
Continue with fluoxetine and trazodone.

## 2022-04-22 NOTE — Assessment & Plan Note (Addendum)
No clinical signs of sepsis (sepsis ruled out).  Left lower extremity with more intense erythema and now having purulence, positive necrotic regions.  Patient has been on IV ceftriaxone for the last 4 days.  Plan to add IV vancomycin and increase dose of ceftriaxone.  Continue pain control with oral analgesics Encourage mobility, out of bed, PT and Ot.

## 2022-04-23 DIAGNOSIS — L03116 Cellulitis of left lower limb: Secondary | ICD-10-CM | POA: Diagnosis not present

## 2022-04-23 DIAGNOSIS — I5033 Acute on chronic diastolic (congestive) heart failure: Secondary | ICD-10-CM | POA: Diagnosis not present

## 2022-04-23 DIAGNOSIS — F341 Dysthymic disorder: Secondary | ICD-10-CM | POA: Diagnosis not present

## 2022-04-23 DIAGNOSIS — E785 Hyperlipidemia, unspecified: Secondary | ICD-10-CM | POA: Diagnosis not present

## 2022-04-23 LAB — CBC
HCT: 35.6 % — ABNORMAL LOW (ref 36.0–46.0)
Hemoglobin: 11.4 g/dL — ABNORMAL LOW (ref 12.0–15.0)
MCH: 32.1 pg (ref 26.0–34.0)
MCHC: 32 g/dL (ref 30.0–36.0)
MCV: 100.3 fL — ABNORMAL HIGH (ref 80.0–100.0)
Platelets: 113 10*3/uL — ABNORMAL LOW (ref 150–400)
RBC: 3.55 MIL/uL — ABNORMAL LOW (ref 3.87–5.11)
RDW: 13.8 % (ref 11.5–15.5)
WBC: 10 10*3/uL (ref 4.0–10.5)
nRBC: 0 % (ref 0.0–0.2)

## 2022-04-23 LAB — BASIC METABOLIC PANEL
Anion gap: 8 (ref 5–15)
BUN: 19 mg/dL (ref 8–23)
CO2: 33 mmol/L — ABNORMAL HIGH (ref 22–32)
Calcium: 9.4 mg/dL (ref 8.9–10.3)
Chloride: 96 mmol/L — ABNORMAL LOW (ref 98–111)
Creatinine, Ser: 1.17 mg/dL — ABNORMAL HIGH (ref 0.44–1.00)
GFR, Estimated: 48 mL/min — ABNORMAL LOW (ref >60)
Glucose, Bld: 194 mg/dL — ABNORMAL HIGH (ref 70–99)
Potassium: 3.8 mmol/L (ref 3.5–5.1)
Sodium: 137 mmol/L (ref 135–145)

## 2022-04-23 MED ORDER — FUROSEMIDE 10 MG/ML IJ SOLN
40.0000 mg | Freq: Every day | INTRAMUSCULAR | Status: DC
  Administered 2022-04-23 – 2022-04-25 (×3): 40 mg via INTRAVENOUS
  Filled 2022-04-23 (×3): qty 4

## 2022-04-23 MED ORDER — POTASSIUM CHLORIDE CRYS ER 20 MEQ PO TBCR
40.0000 meq | EXTENDED_RELEASE_TABLET | Freq: Once | ORAL | Status: AC
  Administered 2022-04-23: 40 meq via ORAL
  Filled 2022-04-23: qty 2

## 2022-04-23 NOTE — Progress Notes (Signed)
Progress Note   Patient: [name] [id number]:[id number] DOB: [birth year] DOA: 04/20/2022     1 DOS: the patient was seen and examined on 04/23/2022   Brief hospital course: Mrs. Monroy was admitted to the hospital with the working diagnosis of decompensated heart failure complicated with lower extremity cellulitis.   76 yo female with the past medical history of hypertension, asthma, ILD, RA, heart failure and chronic hypoxemic respiratory failure who presented with dyspnea and lower extremity edema. Reported 5 days of progressive and worsening, lower extremity edema. 2 days of left left erythema and tenderness, along with subjective fevers. Family contacted her cardiologist and she was advices to come to the hospital. On her initial physical examination her blood pressure was 168/85, HR 95, RR 27 and 02 saturation 89%, lungs with rales bilaterally, heart with S1 and S2 present and rhythmic, abdomen with no distention, and positive lower extremity edema, left leg with erythema up to the knee.   Na 134, K 3,6 CL 99, bicarbonate 30 glucose 145, bun 23 cr 0,94  High sensitive troponin 21 and 22  Wbc 11,6 hgb 12.3 plt 180   Chest radiograph with cardiomegaly, mild hilar vascular congestion, no infiltrates or effusions.   EKG 96 bpm, left axis deviation, normal intervals, sinus rhythm with no significant ST segment or T wave changes.   Patient was placed on antibiotic therapy for cellulitis.  IV furosemide for diuresis.   10/07 cellulitis is improving clinically.     Assessment and Plan: * Cellulitis No clinical signs of sepsis (sepsis ruled out).  Left lower extremity rash is improving.  Wbc is 10,0  Plan to continue antibiotic therapy with ceftriaxone Continue close follow up on clinical resolution of rash.  Pain control with oral analgesics.     Acute on chronic diastolic CHF (congestive heart failure) (HCC) Echocardiogram with preserved LV systolic function with EF 55 to 86%, RV  systolic function preserved. No significant valvular disease. Trivial pericardial effusion.   Patient with signs of peripheral edema today pitting at the hips bilaterally.  Will change furosemide to 40 mg IV to augment diuresis No SGLT 2 ing due to skin infection.      ILD (interstitial lung disease) ([place]) Chronic hypoxemic respiratory failure.   Patient is oxygenation 98% on 7 L/min per HFNC (baseline needs 7 L/min).  Continue bronchodilator therapy Continue systemic steroids with prednisone 5 mg daily.  On perifenidone.   Hyperlipidemia LDL goal <70 Continue with statin therapy.   ANXIETY DEPRESSION Continue with fluoxetine and trazodone.   Pulmonary hypertension ([place]) Continue with treprostinil and verapamil.   Obese Calculated BMI is 39,6 consistent with obesity class 2.   Rheumatoid arthritis (HCC) Continue withy oral prednisone.   Hypokalemia Today renal function with serum cr at 1,17 with K at 3,8 and serum bicarbonate at 33.  Plan to continue diuresis with IV furosemide and add 40 meq Kcl today to prevent hypokalemia.  Follow up renal function and electrolytes in am.         Subjective: Patient with no chest pain, positive edema at the hips, rash left lower extremity is improving   Physical Exam: Vitals:   04/23/22 0441 04/23/22 0600 04/23/22 0833 04/23/22 1041  BP: 129/86  109/77 (!) 151/79  Pulse: 80  91 96  Resp: _0 Temp: 98 F (36.7 C)  98.1 F (36.7 C) 98.1 F (36.7 C)  TempSrc: Oral  Oral Oral  SpO2: 98%  96% 95%  Weight:  93.2 kg    Height:       Neurology awake and alert ENT with mild pallor Cardiovascular with S1 and S2 present and rhythmic with no gallops, rubs or murmurs Respiratory with mild rales Abdomen no distention  Positive pitting edema at the hips bilaterally ++ Left leg rash with decreased intensity  Data Reviewed:    Family Communication: I spoke with patient's husband at the bedside, we talked in detail about  patient's condition, plan of care and prognosis and all questions were addressed.   Disposition: Status is: Inpatient Remains inpatient appropriate because: cellulitis, need IV antibiotic therapy   Planned Discharge Destination: Home    Author: Tawni Millers, MD 04/23/2022 12:30 PM  For on call review [email or web address].

## 2022-04-23 NOTE — Progress Notes (Signed)
  Transition of Care [facility]) Screening Note   Patient Details  Name: [name] Date of Birth: February 26, 1946   Transition of Care [facility]) CM/SW Contact:    Bartholomew Crews, RN Phone Number: [phone] 04/23/2022, 10:33 AM    Transition of Care Department [facility]) has reviewed patient and no TOC needs have been identified at this time. We will continue to monitor patient advancement through interdisciplinary progression rounds. If new patient transition needs arise, please place a TOC consult.

## 2022-04-24 DIAGNOSIS — E785 Hyperlipidemia, unspecified: Secondary | ICD-10-CM | POA: Diagnosis not present

## 2022-04-24 DIAGNOSIS — I5033 Acute on chronic diastolic (congestive) heart failure: Secondary | ICD-10-CM | POA: Diagnosis not present

## 2022-04-24 DIAGNOSIS — L03116 Cellulitis of left lower limb: Secondary | ICD-10-CM | POA: Diagnosis not present

## 2022-04-24 DIAGNOSIS — F341 Dysthymic disorder: Secondary | ICD-10-CM | POA: Diagnosis not present

## 2022-04-24 LAB — CBC
HCT: 35.1 % — ABNORMAL LOW (ref 36.0–46.0)
Hemoglobin: 11.3 g/dL — ABNORMAL LOW (ref 12.0–15.0)
MCH: 32 pg (ref 26.0–34.0)
MCHC: 32.2 g/dL (ref 30.0–36.0)
MCV: 99.4 fL (ref 80.0–100.0)
Platelets: 116 10*3/uL — ABNORMAL LOW (ref 150–400)
RBC: 3.53 MIL/uL — ABNORMAL LOW (ref 3.87–5.11)
RDW: 13.8 % (ref 11.5–15.5)
WBC: 8.9 10*3/uL (ref 4.0–10.5)
nRBC: 0 % (ref 0.0–0.2)

## 2022-04-24 LAB — BASIC METABOLIC PANEL
Anion gap: 7 (ref 5–15)
BUN: 13 mg/dL (ref 8–23)
CO2: 34 mmol/L — ABNORMAL HIGH (ref 22–32)
Calcium: 9.5 mg/dL (ref 8.9–10.3)
Chloride: 94 mmol/L — ABNORMAL LOW (ref 98–111)
Creatinine, Ser: 0.85 mg/dL (ref 0.44–1.00)
GFR, Estimated: >60 mL/min (ref >60)
Glucose, Bld: 135 mg/dL — ABNORMAL HIGH (ref 70–99)
Potassium: 3.4 mmol/L — ABNORMAL LOW (ref 3.5–5.1)
Sodium: 135 mmol/L (ref 135–145)

## 2022-04-24 MED ORDER — POTASSIUM CHLORIDE CRYS ER 20 MEQ PO TBCR
40.0000 meq | EXTENDED_RELEASE_TABLET | ORAL | Status: AC
  Administered 2022-04-24 (×2): 40 meq via ORAL
  Filled 2022-04-24 (×2): qty 2

## 2022-04-24 NOTE — Progress Notes (Signed)
   04/24/22 1224  Patient Belongings  Patient/Family advised about valuables policy? Yes  Home Medications Selected meds being dispensed (Include drug name in comment) (pirfenidone tablet 857m)  Patient Belongings Kept at bedside  [facility] (Comment) (trepositinil inhalation route and powder cartridges to self admin)

## 2022-04-24 NOTE — Progress Notes (Addendum)
Progress Note   Patient: [name] [id number]:[id number] DOB: Feb 14, 1946 DOA: 04/20/2022     2 DOS: the patient was seen and examined on 04/24/2022   Brief hospital course: [name] was admitted to the hospital with the working diagnosis of decompensated heart failure complicated with lower extremity cellulitis.   76 yo female with the past medical history of hypertension, asthma, ILD, RA, heart failure and chronic hypoxemic respiratory failure who presented with dyspnea and lower extremity edema. Reported 5 days of progressive and worsening, lower extremity edema. 2 days of left left erythema and tenderness, along with subjective fevers. Family contacted her cardiologist and she was advices to come to the hospital. On her initial physical examination her blood pressure was 168/85, HR 95, RR 27 and 02 saturation 89%, lungs with rales bilaterally, heart with S1 and S2 present and rhythmic, abdomen with no distention, and positive lower extremity edema, left leg with erythema up to the knee.   Na 134, K 3,6 CL 99, bicarbonate 30 glucose 145, bun 23 cr 0,94  High sensitive troponin 21 and 22  Wbc 11,6 hgb 12.3 plt 180   Chest radiograph with cardiomegaly, mild hilar vascular congestion, no infiltrates or effusions.   EKG 96 bpm, left axis deviation, normal intervals, sinus rhythm with no significant ST segment or T wave changes.   Patient was placed on antibiotic therapy for cellulitis.  IV furosemide for diuresis.   10/07 cellulitis is improving clinically.  10/08 continue to improve cellulitis.     Assessment and Plan: * Cellulitis No clinical signs of sepsis (sepsis ruled out).  Left lower extremity rash is improving.  Wbc is 8,9   Responding well to IV ceftriaxone for antibiotic therapy, likely will need at least 48 hrs more of IV antibiotic therapy before transitioning to oral antibiotic therapy.  Continue close follow up on clinical resolution of rash.  Pain control with oral  analgesics.     Acute on chronic diastolic CHF (congestive heart failure) (HCC) Echocardiogram with preserved LV systolic function with EF 55 to 52%, RV systolic function preserved. No significant valvular disease. Trivial pericardial effusion.   Improved volume status, will continue furosemide IV for today and will transition to po tomorrow if continue to improve her volume status.  No SGLT 2 ing due to skin infection.      ILD (interstitial lung disease) ([place]) Chronic hypoxemic respiratory failure.   Patient is oxygenation 98% on 7 L/min per HFNC (baseline needs 7 L/min).  Continue bronchodilator therapy Continue systemic steroids with prednisone 5 mg daily.  On perifenidone.   Hyperlipidemia LDL goal <70 Continue with statin therapy.   ANXIETY DEPRESSION Continue with fluoxetine and trazodone.   Pulmonary hypertension ([place]) Continue with treprostinil and verapamil.   Obese Calculated BMI is 39,6 consistent with obesity class 2.   Rheumatoid arthritis (HCC) Continue withy oral prednisone.   Hypokalemia Today renal function with serum cr at 0,85 with K at 3,4 and serum bicarbonate at 34. Plan to add Kcl 40 meq x 2 doses and follow up renal function and electrolytes in am.  Continue IV furosemide for now.         Subjective: Patient with improvement in her rash but continue to have pain and not back to baseline   Physical Exam: Vitals:   04/24/22 0328 04/24/22 0729 04/24/22 1038 04/24/22 1116  BP: 137/73 (!) 142/81 135/68 (!) 156/86  Pulse: 96 89 93 84  Resp: _0 Temp: 98.2 F (  36.8 C) 97.8 F (36.6 C)  98.1 F (36.7 C)  TempSrc: Oral Oral  Oral  SpO2: 95% 97% 95% 94%  Weight: 93.9 kg     Height:       Neurology awake and alert ENT with mild pallor Cardiovascular with S1 and S2 present and rhythmic with no gallops or murmurs Respiratory with scattered rales with no wheezing Abdomen with no distention  Left lower extremity with improved rash  but still present mainly on the lateral leg.  Edema pitting bilateral hips ++  Data Reviewed:    Family Communication: I spoke with patient's husband at the bedside, we talked in detail about patient's condition, plan of care and prognosis and all questions were addressed.   Disposition: Status is: Inpatient Remains inpatient appropriate because: cellulitis, need for IV antibiotic therapy   Planned Discharge Destination: Home      Author: Tawni Millers, MD 04/24/2022 12:08 PM  For on call review [email or web address].

## 2022-04-24 NOTE — Progress Notes (Signed)
Patient does not wish to use CPAP tonight.  States she has not slept well on it the past 2 nights and had to remove it during the night.  Patient tolerating 7L high flow nasal cannula at this time.  O2 sat 97%

## 2022-04-25 LAB — BASIC METABOLIC PANEL
Anion gap: 3 — ABNORMAL LOW (ref 5–15)
BUN: 17 mg/dL (ref 8–23)
CO2: 36 mmol/L — ABNORMAL HIGH (ref 22–32)
Calcium: 9.8 mg/dL (ref 8.9–10.3)
Chloride: 100 mmol/L (ref 98–111)
Creatinine, Ser: 0.91 mg/dL (ref 0.44–1.00)
GFR, Estimated: >60 mL/min (ref >60)
Glucose, Bld: 140 mg/dL — ABNORMAL HIGH (ref 70–99)
Potassium: 4.4 mmol/L (ref 3.5–5.1)
Sodium: 139 mmol/L (ref 135–145)

## 2022-04-25 LAB — CBC
HCT: 35.5 % — ABNORMAL LOW (ref 36.0–46.0)
Hemoglobin: 11 g/dL — ABNORMAL LOW (ref 12.0–15.0)
MCH: 31.3 pg (ref 26.0–34.0)
MCHC: 31 g/dL (ref 30.0–36.0)
MCV: 101.1 fL — ABNORMAL HIGH (ref 80.0–100.0)
Platelets: 123 10*3/uL — ABNORMAL LOW (ref 150–400)
RBC: 3.51 MIL/uL — ABNORMAL LOW (ref 3.87–5.11)
RDW: 14 % (ref 11.5–15.5)
WBC: 6.9 10*3/uL (ref 4.0–10.5)
nRBC: 0 % (ref 0.0–0.2)

## 2022-04-25 MED ORDER — VANCOMYCIN HCL 1750 MG/350ML IV SOLN
1750.0000 mg | Freq: Once | INTRAVENOUS | Status: AC
  Administered 2022-04-25: 1750 mg via INTRAVENOUS
  Filled 2022-04-25: qty 350

## 2022-04-25 MED ORDER — TREPROSTINIL 64 MCG IN POWD
1.0000 | Freq: Four times a day (QID) | RESPIRATORY_TRACT | Status: DC
  Administered 2022-04-25 – 2022-05-03 (×30): 1 via RESPIRATORY_TRACT

## 2022-04-25 MED ORDER — FUROSEMIDE 40 MG PO TABS
40.0000 mg | ORAL_TABLET | Freq: Every day | ORAL | Status: DC
  Administered 2022-04-26 – 2022-04-27 (×2): 40 mg via ORAL
  Filled 2022-04-25 (×2): qty 1

## 2022-04-25 MED ORDER — VANCOMYCIN HCL 750 MG/150ML IV SOLN
750.0000 mg | INTRAVENOUS | Status: DC
  Administered 2022-04-26 – 2022-05-01 (×6): 750 mg via INTRAVENOUS
  Filled 2022-04-25 (×8): qty 150

## 2022-04-25 MED ORDER — SODIUM CHLORIDE 0.9 % IV SOLN
2.0000 g | INTRAVENOUS | Status: DC
  Administered 2022-04-25: 2 g via INTRAVENOUS
  Filled 2022-04-25: qty 20

## 2022-04-25 NOTE — Evaluation (Signed)
Occupational Therapy Evaluation Patient Details Name: [name] MRN: [id number] DOB: 01/22/1946 Today's Date: 04/25/2022   History of Present Illness Patient is a 76 yo female presenting to the [place] ED with BLE swelling and SOB on 04/20/22. Admitted with LLE cellulitis and decompensated heart failure on 04/23/22.  PMH includes: HTN, HFpEF, asthmatic bronchitis, PAH, ILD, chronic respiratory failure on 7 L of oxygen, rheumatoid arthritis on chronic immunosuppression, and OSA   Clinical Impression   Prior to this admission, patient was on 7L of oxygen at baseline, did not drive, and husband assisted with lower body ADLs. Husband also is present when patient showers. Currently, patient presents with decreased activity tolerance, poor endurance, and min A for ADLs and transfers. O2 dropping to 84% on 7L with minimal ambulation, but able to recover with coaching of pursed lip breathing. OT recommending HHOT at discharge; will continue to follow acutely.      Recommendations for follow up therapy are one component of a multi-disciplinary discharge planning process, led by the attending physician.  Recommendations may be updated based on patient status, additional functional criteria and insurance authorization.   Follow Up Recommendations  Home health OT    Assistance Recommended at Discharge Intermittent Supervision/Assistance  Patient can return home with the following A little help with walking and/or transfers;A little help with bathing/dressing/bathroom;Assistance with cooking/housework;Direct supervision/assist for medications management;Direct supervision/assist for financial management;Assist for transportation;Help with stairs or ramp for entrance    Functional Status Assessment  Patient has had a recent decline in their functional status and demonstrates the ability to make significant improvements in function in a reasonable and predictable amount of time.  Equipment  Recommendations  None recommended by OT (Patient has DME needed)    Recommendations for Other Services       Precautions / Restrictions Precautions Precautions: Fall Precaution Comments: LLE cellulitis Restrictions Weight Bearing Restrictions: No      Mobility Bed Mobility Overal bed mobility: Needs Assistance Bed Mobility: Sit to Supine       Sit to supine: Min assist   General bed mobility comments: Assist for LLE    Transfers Overall transfer level: Needs assistance Equipment used: Rolling walker (2 wheels) Transfers: Sit to/from Stand Sit to Stand: Min guard           General transfer comment: Min guard for safety      Balance Overall balance assessment: Needs assistance Sitting-balance support: No upper extremity supported, Feet supported Sitting balance-Leahy Scale: Fair     Standing balance support: Bilateral upper extremity supported Standing balance-Leahy Scale: Poor Standing balance comment: Reliant on UE support                           ADL either performed or assessed with clinical judgement   ADL Overall ADL's : Needs assistance/impaired Eating/Feeding: Set up;Sitting   Grooming: Set up;Sitting   Upper Body Bathing: Minimal assistance;Sitting   Lower Body Bathing: Moderate assistance;Sitting/lateral leans;Sit to/from stand   Upper Body Dressing : Minimal assistance;Sitting   Lower Body Dressing: Moderate assistance;Sitting/lateral leans;Sit to/from stand   Toilet Transfer: Minimal assistance;Ambulation;Rolling walker (2 wheels)   Toileting- Clothing Manipulation and Hygiene: Min guard;Sitting/lateral lean;Sit to/from stand       Functional mobility during ADLs: Minimal assistance;Cueing for safety;Cueing for sequencing;Rolling walker (2 wheels) General ADL Comments: Patient presenting with decreased activity tolerance and SOB with all movement     Vision Baseline Vision/History: 1 Wears glasses (vision also corrected  previously) Ability to See in Adequate Light: 0 Adequate Patient Visual Report: No change from baseline       Perception     Praxis      Pertinent Vitals/Pain Pain Assessment Pain Assessment: Faces Faces Pain Scale: Hurts little more Pain Location: LLE Pain Descriptors / Indicators: Grimacing, Guarding Pain Intervention(s): Limited activity within patient's tolerance, Monitored during session, Repositioned     Hand Dominance     Extremity/Trunk Assessment Upper Extremity Assessment Upper Extremity Assessment: Defer to OT evaluation   Lower Extremity Assessment Lower Extremity Assessment: Generalized weakness;LLE deficits/detail LLE Deficits / Details: hx of L foot surgery. Increased redness at thigh   Cervical / Trunk Assessment Cervical / Trunk Assessment: Kyphotic   Communication Communication Communication: No difficulties   Cognition Arousal/Alertness: Awake/alert Behavior During Therapy: WFL for tasks assessed/performed Overall Cognitive Status: Impaired/Different from baseline Area of Impairment: Memory                     Memory: Decreased short-term memory         General Comments: Questionable memory deficits as pt reporting PT came in to check on her earlier, but had not.     General Comments  Pt's husband present throughotu    Exercises     Shoulder Instructions      Home Living Family/patient expects to be discharged to:: Private residence Living Arrangements: Spouse/significant other Available Help at Discharge: Family Type of Home: House Home Access: Stairs to enter [employer] of Steps: 3 Entrance Stairs-Rails: Can reach both [place]: Two level;Full bath on main level;Able to live on main level with bedroom/bathroom     Bathroom Shower/Tub: Walk-in shower;Door   [employer] Toilet: Handicapped height     Home Equipment: Civil engineer, contracting - built in;Cane - single Gaffer (4 wheels)           Prior Functioning/Environment Prior Level of Function : Needs assist;History of Falls (last six months)             Mobility Comments: walks with a rollator ADLs Comments: husband assists with all lower body ADLs, is always present for showering        OT Problem List: Decreased strength;Decreased range of motion;Decreased activity tolerance;Impaired balance (sitting and/or standing);Decreased coordination;Decreased safety awareness;Decreased knowledge of use of DME or AE;Cardiopulmonary status limiting activity;Increased edema      OT Treatment/Interventions: Therapeutic exercise;Self-care/ADL training;Energy conservation;DME and/or AE instruction;Manual therapy;Therapeutic activities;Patient/family education;Balance training    OT Goals(Current goals can be found in the care plan section) Acute Rehab OT Goals Patient Stated Goal: to get better OT Goal Formulation: With patient Time For Goal Achievement: 05/09/22 Potential to Achieve Goals: Good ADL Goals Pt Will Transfer to Toilet: with supervision;ambulating Pt Will Perform Toileting - Clothing Manipulation and hygiene: with supervision;sitting/lateral leans;sit to/from stand Pt/caregiver will Perform Home Exercise Program: With Supervision;With written HEP provided;Increased strength;Both right and left upper extremity Additional ADL Goal #1: Patient will demonstrate increased activity tolerance to complete functional task in standing for 3 minutes prior to needing seated rest break. Additional ADL Goal #2: Patient will be able to demonstrate pursed lip breathing in order to promote increased activity tolerance and decrease anxiety with movement independently.  OT Frequency: Min 2X/week    Co-evaluation              AM-PAC OT "6 Clicks" Daily Activity     Outcome Measure Help from another person eating meals?: A Little Help from another person taking care  of personal grooming?: A Little Help from another person  toileting, which includes using toliet, bedpan, or urinal?: A Little Help from another person bathing (including washing, rinsing, drying)?: A Little Help from another person to put on and taking off regular upper body clothing?: A Little Help from another person to put on and taking off regular lower body clothing?: A Little 6 Click Score: 18   End of Session Equipment Utilized During Treatment: Gait belt;Rolling walker (2 wheels);Other (comment) Nurse Communication: Mobility status  Activity Tolerance: Patient limited by fatigue;Patient limited by lethargy Patient left: in bed;with call bell/phone within reach;with family/visitor present  OT Visit Diagnosis: Unsteadiness on feet (R26.81);Muscle weakness (generalized) (M62.81);History of falling (Z91.81);Pain Pain - Right/Left: Left Pain - part of body: Leg                Time: 2924-4628 OT Time Calculation (min): 21 min Charges:  OT General Charges $OT Visit: 1 Visit OT Evaluation $OT Eval Moderate Complexity: 1 Mod  Corinne Ports E. Wilkie Zenon, OTR/L Acute Rehabilitation Services [phone]   Ascencion Dike 04/25/2022, 3:12 PM

## 2022-04-25 NOTE — Progress Notes (Signed)
   04/25/22 1100  Mobility  Activity Ambulated independently in hallway  Activity Response Tolerated well  Distance Ambulated (ft) 30 ft  $Mobility charge 1 Mobility  Level of Assistance Contact guard assist, steadying assist  Assistive Device Front wheel walker  Mobility Referral Yes   Mobility Specialist Progress Note  Pt was in chair and agreeable. Had SOB but recovered after being coached through pursed lip breathing. Returned to chair w/ all needs met and call bell in reach.   Lucious Groves Mobility Specialist

## 2022-04-25 NOTE — Evaluation (Signed)
Physical Therapy Evaluation Patient Details Name: [name] MRN: [id number] DOB: 01-Apr-1946 Today's Date: 04/25/2022  History of Present Illness  Patient is a 76 yo female presenting to the [facility] ED with BLE swelling and SOB on 04/20/22. Admitted with LLE cellulitis and decompensated heart failure on 04/23/22.  PMH includes: HTN, HFpEF, asthmatic bronchitis, PAH, ILD, chronic respiratory failure on 7 L of oxygen, rheumatoid arthritis on chronic immunosuppression, and OSA  Clinical Impression  Pt admitted secondary to problem above with deficits below. Pt requiring min guard to min A for mobility tasks this session. Increased SOB during ambulation on 7L and oxygen sats dropping to 84%. Required seated rest and pursed lip breathing to return to 90% on 7L. Recommending HHPT to address current deficits. Will continue to follow acutely.      Recommendations for follow up therapy are one component of a multi-disciplinary discharge planning process, led by the attending physician.  Recommendations may be updated based on patient status, additional functional criteria and insurance authorization.  Follow Up Recommendations Home health PT      Assistance Recommended at Discharge Intermittent Supervision/Assistance  Patient can return home with the following  A little help with walking and/or transfers;A little help with bathing/dressing/bathroom;Help with stairs or ramp for entrance;Assist for transportation;Assistance with cooking/housework    Equipment Recommendations None recommended by PT  Recommendations for Other Services       Functional Status Assessment Patient has had a recent decline in their functional status and demonstrates the ability to make significant improvements in function in a reasonable and predictable amount of time.     Precautions / Restrictions Precautions Precautions: Fall Precaution Comments: LLE cellulitis Restrictions Weight Bearing Restrictions: No       Mobility  Bed Mobility Overal bed mobility: Needs Assistance Bed Mobility: Sit to Supine       Sit to supine: Min assist   General bed mobility comments: Assist for LLE    Transfers Overall transfer level: Needs assistance Equipment used: Rolling walker (2 wheels) Transfers: Sit to/from Stand Sit to Stand: Min guard           General transfer comment: Min guard for safety    Ambulation/Gait Ambulation/Gait assistance: Min guard Gait Distance (Feet): 30 Feet Assistive device: Rolling walker (2 wheels) Gait Pattern/deviations: Step-through pattern, Decreased stride length Gait velocity: Decreased     General Gait Details: Pt limited secondary to fatigue. Only ambulates short distances at baseline. Increased SOB on 7L and oxygen sats dropping to 84%. Required seated rest and pursed lip breathing to return to 90% on 7L.  Stairs            Wheelchair Mobility    Modified Rankin (Stroke Patients Only)       Balance Overall balance assessment: Needs assistance Sitting-balance support: No upper extremity supported, Feet supported Sitting balance-Leahy Scale: Fair     Standing balance support: Bilateral upper extremity supported, During functional activity Standing balance-Leahy Scale: Poor Standing balance comment: Reliant on UE support                             Pertinent Vitals/Pain Pain Assessment Pain Assessment: Faces Faces Pain Scale: Hurts little more Pain Location: LLE Pain Descriptors / Indicators: Grimacing, Guarding Pain Intervention(s): Limited activity within patient's tolerance, Monitored during session, Repositioned    Home Living Family/patient expects to be discharged to:: Private residence Living Arrangements: Spouse/significant other Available Help at Discharge: Family Type of  Home: House Home Access: Stairs to enter Entrance Stairs-Rails: Can reach both Entrance Stairs-Number of Steps: 3   Home Layout: Two  level;Full bath on main level;Able to live on main level with bedroom/bathroom Home Equipment: Shower seat - built in;Cane - single Gaffer (4 wheels)      Prior Function Prior Level of Function : Needs assist;History of Falls (last six months)             Mobility Comments: walks with a rollator ADLs Comments: husband assists with all lower body ADLs, is always present for showering     Hand Dominance        Extremity/Trunk Assessment   Upper Extremity Assessment Upper Extremity Assessment: Defer to OT evaluation    Lower Extremity Assessment Lower Extremity Assessment: Generalized weakness;LLE deficits/detail LLE Deficits / Details: hx of L foot surgery. Increased redness at thigh    Cervical / Trunk Assessment Cervical / Trunk Assessment: Kyphotic  Communication   Communication: No difficulties  Cognition Arousal/Alertness: Awake/alert Behavior During Therapy: WFL for tasks assessed/performed Overall Cognitive Status: Impaired/Different from baseline Area of Impairment: Memory                     Memory: Decreased short-term memory         General Comments: Questionable memory deficits as pt reporting PT came in to check on her earlier, but had not.        General Comments General comments (skin integrity, edema, etc.): Pt's husband present throughotu    Exercises     Assessment/Plan    PT Assessment Patient needs continued PT services  PT Problem List Decreased strength;Decreased balance;Decreased activity tolerance;Decreased mobility;Decreased knowledge of use of DME;Decreased knowledge of precautions;Decreased cognition;Decreased safety awareness       PT Treatment Interventions DME instruction;Gait training;Functional mobility training;Therapeutic activities;Therapeutic exercise;Balance training;Patient/family education;Stair training    PT Goals (Current goals can be found in the Care Plan section)  Acute  Rehab PT Goals Patient Stated Goal: to go home PT Goal Formulation: With patient Time For Goal Achievement: 05/09/22 Potential to Achieve Goals: Good    Frequency Min 3X/week     Co-evaluation               AM-PAC PT "6 Clicks" Mobility  Outcome Measure Help needed turning from your back to your side while in a flat bed without using bedrails?: A Little Help needed moving from lying on your back to sitting on the side of a flat bed without using bedrails?: A Little Help needed moving to and from a bed to a chair (including a wheelchair)?: A Little Help needed standing up from a chair using your arms (e.g., wheelchair or bedside chair)?: A Little Help needed to walk in hospital room?: A Little Help needed climbing 3-5 steps with a railing? : A Lot 6 Click Score: 17    End of Session Equipment Utilized During Treatment: Gait belt;Oxygen Activity Tolerance: Patient limited by fatigue Patient left: in bed;with call bell/phone within reach;with family/visitor present Nurse Communication: Mobility status PT Visit Diagnosis: Unsteadiness on feet (R26.81);Muscle weakness (generalized) (M62.81);Difficulty in walking, not elsewhere classified (R26.2)    Time: 9030-0923 PT Time Calculation (min) (ACUTE ONLY): 21 min   Charges:   PT Evaluation $PT Eval Moderate Complexity: 1 Mod          Reuel Derby, PT, DPT  Acute Rehabilitation Services  Office: [phone]   Rudean Hitt 04/25/2022, 3:03 PM

## 2022-04-25 NOTE — Progress Notes (Signed)
Pt states she does not want to wear her CPAP tonight. Pt self administered her home med Treprostinil inhaler.

## 2022-04-25 NOTE — Progress Notes (Addendum)
Pharmacy Antibiotic Note  [name] is a 76 y.o. female admitted on 04/20/2022 with worsening leg swelling.  Patient has been on Rocephin for cellulitis and Pharmacy now consulted to add vancomycin.    Renal functions table, afebrile, WBC WNL.  Plan: Vanc 1764m IV x 1, then 7546mIV Q24H for AUC 480 using SCr 0.91 Rocephin 2gm IV Q24H per MD Monitor renal fxn, clinical progress, vanc levels as indicated  Height: 5' (152.4 cm) Weight: 89.9 kg (198 lb 3.1 oz) IBW/kg (Calculated) : 45.5  Temp (24hrs), Avg:98 F (36.7 C), Min:97.9 F (36.6 C), Max:98.1 F (36.7 C)  Recent Labs  Lab 04/20/22 1820 04/21/22 1047 04/22/22 0042 04/23/22 0053 04/24/22 0107 04/25/22 0325  WBC 11.6* 10.6* 11.1* 10.0 8.9 6.9  CREATININE 0.94  --  0.90 1.17* 0.85 0.91    Estimated Creatinine Clearance: 50.9 mL/min (by C-G formula based on SCr of 0.91 mg/dL).    Allergies  Allergen Reactions   Cefdinir Diarrhea   Erythromycin Base Diarrhea   Lactose Dermatitis   Lactulose Diarrhea   Mesalamine Nausea Only   Methotrexate Other (See Comments)    Felt sick   Nitrofuran Derivatives     shakiness   Other Diarrhea and Other (See Comments)    Shaking uncontrollably "lettuce only" "lettuce only"   Sulfa Antibiotics Other (See Comments)    Patient can't recall reaction    Tdap [Tetanus-Diphth-Acell Pertussis]     Shaking uncontrollably   Tetanus Toxoid, Adsorbed Other (See Comments)    Shaking uncontrollable    Rocephin 10/5 >> 10/12  Vanc 10/9 >>    Cristol Engdahl D. DaMina MarblePharmD, BCPS, BCWoodsburgh0/03/2022, 9:00 AM

## 2022-04-26 ENCOUNTER — Inpatient Hospital Stay (HOSPITAL_COMMUNITY): Payer: Medicare Other

## 2022-04-26 DIAGNOSIS — I5033 Acute on chronic diastolic (congestive) heart failure: Secondary | ICD-10-CM | POA: Diagnosis not present

## 2022-04-26 DIAGNOSIS — I272 Pulmonary hypertension, unspecified: Secondary | ICD-10-CM | POA: Diagnosis not present

## 2022-04-26 DIAGNOSIS — J849 Interstitial pulmonary disease, unspecified: Secondary | ICD-10-CM | POA: Diagnosis not present

## 2022-04-26 DIAGNOSIS — L03116 Cellulitis of left lower limb: Secondary | ICD-10-CM | POA: Diagnosis not present

## 2022-04-26 DIAGNOSIS — M059 Rheumatoid arthritis with rheumatoid factor, unspecified: Secondary | ICD-10-CM

## 2022-04-26 LAB — CBC
HCT: 36.2 % (ref 36.0–46.0)
Hemoglobin: 11.3 g/dL — ABNORMAL LOW (ref 12.0–15.0)
MCH: 31.2 pg (ref 26.0–34.0)
MCHC: 31.2 g/dL (ref 30.0–36.0)
MCV: 100 fL (ref 80.0–100.0)
Platelets: 142 10*3/uL — ABNORMAL LOW (ref 150–400)
RBC: 3.62 MIL/uL — ABNORMAL LOW (ref 3.87–5.11)
RDW: 13.8 % (ref 11.5–15.5)
WBC: 4.8 10*3/uL (ref 4.0–10.5)
nRBC: 0 % (ref 0.0–0.2)

## 2022-04-26 LAB — CK: Total CK: 32 U/L — ABNORMAL LOW (ref 38–234)

## 2022-04-26 MED ORDER — IOHEXOL 350 MG/ML SOLN
75.0000 mL | Freq: Once | INTRAVENOUS | Status: AC | PRN
  Administered 2022-04-26: 75 mL via INTRAVENOUS

## 2022-04-26 MED ORDER — SODIUM CHLORIDE 0.9 % IV SOLN
3.0000 g | Freq: Four times a day (QID) | INTRAVENOUS | Status: DC
  Filled 2022-04-26 (×2): qty 8

## 2022-04-26 MED ORDER — SODIUM CHLORIDE 0.9 % IV SOLN
3.0000 g | Freq: Four times a day (QID) | INTRAVENOUS | Status: DC
  Administered 2022-04-26 – 2022-05-02 (×23): 3 g via INTRAVENOUS
  Filled 2022-04-26 (×24): qty 8

## 2022-04-26 NOTE — Progress Notes (Addendum)
Progress Note   Patient: [name] MCN:[id number] DOB: [birth year] DOA: 04/20/2022     4 DOS: the patient was seen and examined on 04/26/2022   Brief hospital course: Mrs. Filler was admitted to the hospital with the working diagnosis of decompensated heart failure complicated with lower extremity cellulitis.   76 yo female with the past medical history of hypertension, asthma, ILD, RA, heart failure and chronic hypoxemic respiratory failure who presented with dyspnea and lower extremity edema. Reported 5 days of progressive and worsening, lower extremity edema. 2 days of left left erythema and tenderness, along with subjective fevers. Family contacted her cardiologist and she was advices to come to the hospital. On her initial physical examination her blood pressure was 168/85, HR 95, RR 27 and 02 saturation 89%, lungs with rales bilaterally, heart with S1 and S2 present and rhythmic, abdomen with no distention, and positive lower extremity edema, left leg with erythema up to the knee.   Na 134, K 3,6 CL 99, bicarbonate 30 glucose 145, bun 23 cr 0,94  High sensitive troponin 21 and 22  Wbc 11,6 hgb 12.3 plt 180   Chest radiograph with cardiomegaly, mild hilar vascular congestion, no infiltrates or effusions.   EKG 96 bpm, left axis deviation, normal intervals, sinus rhythm with no significant ST segment or T wave changes.   Patient was placed on antibiotic therapy for cellulitis.  IV furosemide for diuresis.   10/07 cellulitis is improving clinically.  10/08 continue to improve cellulitis.  10/09 cellulitis with purulence added IV vancomycin  10/10 continue worsening cellulitis with areas of necrosis, consulted ID.     Assessment and Plan: * Cellulitis of left lower extremity No clinical signs of sepsis (sepsis ruled out).  Left lower extremity with more intense erythema and now having purulence, positive necrotic regions (enlarging).   Wbc is 4.8  Continue with IV vancomycin  (started yesterday) and high dose of IV ceftriaxone.  I am concerned for worsening areas of necrosis and now purulence.  Plan to consult ID for further recommendations.   Continue pain control with oral analgesics Encourage mobility, out of bed, PT and Ot.    Acute on chronic diastolic CHF (congestive heart failure) (HCC) Echocardiogram with preserved LV systolic function with EF 55 to 62%, RV systolic function preserved. No significant valvular disease. Trivial pericardial effusion.   No SGLT 2 ing due to skin infection.  Her volume status has improved, transition to oral furosemide with good toleration. Continue blood pressure monitoring.   ILD (interstitial lung disease) ([place]) Chronic hypoxemic respiratory failure.   Patient is oxygenation 98% on 7 L/min per HFNC (baseline needs 7 L/min).  Continue bronchodilator therapy Continue systemic steroids with prednisone 5 mg daily.  On perifenidone.   Pulmonary hypertension ([place]) Continue with treprostinil and verapamil.   Hyperlipidemia LDL goal <70 Continue with statin therapy.   ANXIETY DEPRESSION Continue with fluoxetine and trazodone.   Rheumatoid arthritis (HCC) Continue withy oral prednisone.   Obese Calculated BMI is 39,6 consistent with obesity class 2.   Hypokalemia Continue diuresis with loop diuretic, follow up renal function in am.         Subjective: Patient with persistent pain on her left lower extremity, improved with oral analgesics, no dyspnea or chest pain  Physical Exam: Vitals:   04/25/22 0725 04/25/22 1935 04/26/22 0400 04/26/22 0848  BP: (!) 158/85 (!) 145/79 (!) 149/96 (!) 160/80  Pulse: 87 86 92 96  Resp: _0 (!) 24  Temp:  97.9 F (36.6 C) 98 F (36.7 C) 97.6 F (36.4 C) 98.3 F (36.8 C)  TempSrc: Oral Oral Oral Oral  SpO2: 97% 97% 97% 93%  Weight:   90.1 kg   Height:       Neurology awake and alert ENT with no pallor Cardiovascular with S1 and S2 present and  rhythmic Respiratory with no wheezing, scattered rales Abdomen with no distention  No lower extremity edema on the right and trace on the left. Left lower extremity with persistent erythma anterior and lateral leg with worsening anterior necrotic region with presence of purulence         Data Reviewed:    Family Communication: no family a the bedside   Disposition: Status is: Inpatient Remains inpatient appropriate because: IV antibiotic for worsening cellulitis   Planned Discharge Destination: Home      Author: Tawni Millers, MD 04/26/2022 11:03 AM  For on call review [email or web address].

## 2022-04-26 NOTE — Progress Notes (Signed)
Pt self administered home med

## 2022-04-26 NOTE — Consult Note (Signed)
Reason for Consult:LLE cellulitis Referring Physician: Riccardo Dubin Arrien Time called: 1220 Time at bedside: [name].  HPI: [name] was admitted 5d ago with cellulitis of the LLE. She has waxing and waning since then. Today seems much worse and orthopedic surgery was consulted. She denies any cuts or bug bites. The pain is moderate but she can still bear weight on the leg. She denies prior hx/o. She generally ambulates with a RW.  Past Medical History:  Diagnosis Date   Acute asthmatic bronchitis    Allergic rhinitis    Anxiety    Arthritis    Esophageal reflux    Hypertension    Interstitial lung disease ([place]) dx jan 2020   Irritable bowel syndrome    Oxygen dependent    4 liters day time 6 liters at night   PONV (postoperative nausea and vomiting)    ponv likes zofran, and scopolamine patch   Pulmonary hypertension ([place])    Rheumatoid arthritis(714.0)    Sleep apnea     Past Surgical History:  Procedure Laterality Date    c secttion  1977   2 foot fusions Left    total of 6 left foot sx   ABDOMINAL HYSTERECTOMY     complete    ANKLE FUSION  2009   left   BACK SURGERY     lower l to l 5 fused   CHOLECYSTECTOMY     RIGHT HEART CATH N/A 11/09/2020   Procedure: RIGHT HEART CATH;  Surgeon: Jolaine Artist, MD;  Location: [facility];  Service: Cardiovascular;  Laterality: N/A;   RIGHT/LEFT HEART CATH AND CORONARY ANGIOGRAPHY N/A 09/16/2016   Procedure: Right/Left Heart Cath and Coronary Angiography;  Surgeon: Nelva Bush, MD;  Location: [facility];  Service: Cardiovascular;  Laterality: N/A;   RIGHT/LEFT HEART CATH AND CORONARY ANGIOGRAPHY N/A 12/17/2021   Procedure: RIGHT/LEFT HEART CATH AND CORONARY ANGIOGRAPHY;  Surgeon: Jolaine Artist, MD;  Location: [facility];  Service: Cardiovascular;  Laterality: N/A;   TONSILLECTOMY     TOTAL HIP ARTHROPLASTY Right 12/20/2018   Procedure: TOTAL HIP ARTHROPLASTY ANTERIOR  APPROACH;  Surgeon: Paralee Cancel, MD;  Location: [facility];  Service: Orthopedics;  Laterality: Right;  70 mins   TOTAL KNEE ARTHROPLASTY Bilateral     Family History  Problem Relation Age of Onset   Heart disease Mother    Arthritis Mother    Heart attack Father    Diabetes Other        sibling   Heart attack Other        sibling    Social History:  reports that she has never smoked. She has never used smokeless tobacco. She reports that she does not currently use alcohol after a past usage of about 7.0 standard drinks of alcohol per week. She reports that she does not use drugs.  Allergies:  Allergies  Allergen Reactions   Cefdinir Diarrhea   Erythromycin Base Diarrhea   Lactose Dermatitis   Lactulose Diarrhea   Mesalamine Nausea Only   Methotrexate Other (See Comments)    Felt sick   Nitrofuran Derivatives     shakiness   Other Diarrhea and Other (See Comments)    Shaking uncontrollably "lettuce only" "lettuce only"   Sulfa Antibiotics Other (See Comments)    Patient can't recall reaction    Tdap [Tetanus-Diphth-Acell Pertussis]     Shaking uncontrollably   Tetanus Toxoid, Adsorbed Other (See  Comments)    Shaking uncontrollable     Medications: I have reviewed the patient's current medications.  Results for orders placed or performed during the hospital encounter of 04/20/22 (from the past 48 hour(s))  Basic metabolic panel     Status: Abnormal   Collection Time: 04/25/22  3:25 AM  Result Value Ref Range   Sodium 139 135 - 145 mmol/L   Potassium 4.4 3.5 - 5.1 mmol/L   Chloride 100 98 - 111 mmol/L   CO2 36 (H) 22 - 32 mmol/L   Glucose, Bld 140 (H) 70 - 99 mg/dL    Comment: Glucose reference range applies only to samples taken after fasting for at least 8 hours.   BUN 17 8 - 23 mg/dL   Creatinine, Ser 0.91 0.44 - 1.00 mg/dL   Calcium 9.8 8.9 - 10.3 mg/dL   GFR, Estimated >60 >60 mL/min    Comment: (NOTE) Calculated using the CKD-EPI Creatinine Equation  (2021)    Anion gap 3 (L) 5 - 15    Comment: Performed at [facility] [address], [place], [place] [address]  CBC     Status: Abnormal   Collection Time: 04/25/22  3:25 AM  Result Value Ref Range   WBC 6.9 4.0 - 10.5 K/uL   RBC 3.51 (L) 3.87 - 5.11 MIL/uL   Hemoglobin 11.0 (L) 12.0 - 15.0 g/dL   HCT 35.5 (L) 36.0 - 46.0 %   MCV 101.1 (H) 80.0 - 100.0 fL   MCH 31.3 26.0 - 34.0 pg   MCHC 31.0 30.0 - 36.0 g/dL   RDW 14.0 11.5 - 15.5 %   Platelets 123 (L) 150 - 400 K/uL   nRBC 0.0 0.0 - 0.2 %    Comment: Performed at [facility], [place] [address] [place], [place] [address]  CBC     Status: Abnormal   Collection Time: 04/26/22  2:41 AM  Result Value Ref Range   WBC 4.8 4.0 - 10.5 K/uL   RBC 3.62 (L) 3.87 - 5.11 MIL/uL   Hemoglobin 11.3 (L) 12.0 - 15.0 g/dL   HCT 36.2 36.0 - 46.0 %   MCV 100.0 80.0 - 100.0 fL   MCH 31.2 26.0 - 34.0 pg   MCHC 31.2 30.0 - 36.0 g/dL   RDW 13.8 11.5 - 15.5 %   Platelets 142 (L) 150 - 400 K/uL   nRBC 0.0 0.0 - 0.2 %    Comment: Performed at [facility], [place] [address] [place], [place] [address]    No results found.  Review of Systems  Constitutional:  Positive for diaphoresis. Negative for chills and fever.  HENT:  Negative for ear discharge, ear pain, hearing loss and tinnitus.   Eyes:  Negative for photophobia and pain.  Respiratory:  Negative for cough and shortness of breath.   Cardiovascular:  Negative for chest pain.  Gastrointestinal:  Negative for abdominal pain, nausea and vomiting.  Genitourinary:  Negative for dysuria, flank pain, frequency and urgency.  Musculoskeletal:  Positive for arthralgias (Left lower leg, left thigh). Negative for back pain, myalgias and neck pain.  Neurological:  Negative for dizziness and headaches.  Hematological:  Does not bruise/bleed easily.  Psychiatric/Behavioral:  The patient is not nervous/anxious.    Blood pressure (!) 160/80, pulse 96, temperature 98.3 F (36.8 C),  temperature source Oral, resp. rate (!) 24, height 5' (1.524 m), weight 90.1 kg, SpO2 94 %. Physical Exam Constitutional:  General: She is not in acute distress.    Appearance: She is well-developed. She is not diaphoretic.  HENT:     Head: Normocephalic and atraumatic.  Eyes:     General: No scleral icterus.       Right eye: No discharge.        Left eye: No discharge.     Conjunctiva/sclera: Conjunctivae normal.  Cardiovascular:     Rate and Rhythm: Normal rate and regular rhythm.  Pulmonary:     Effort: Pulmonary effort is normal. No respiratory distress.  Musculoskeletal:     Cervical back: Normal range of motion.     Comments: LLE No traumatic wounds or ecchymosis. Deep erythema ant/med lower leg with early bullae, mild erythema lateral thigh with induration  Mod TTP, no SQE. No pain with dorsiflexion  No knee effusion  Knee stable to varus/ valgus and anterior/posterior stress  Sens DPN, SPN, TN intact  Motor EHL, ext, flex, evers 5/5  DP 0, PT 1+, 3+ pitting edema  Skin:    General: Skin is warm and dry.  Neurological:     Mental Status: She is alert.  Psychiatric:        Mood and Affect: Mood normal.        Behavior: Behavior normal.     Assessment/Plan: LLE cellulitis -- Will await results of CT. Ability to bear weight reassuring but extension to thigh is worrisome. Suspect will need I&D today emergently or tomorrow electively. Continue NPO for now.    Lisette Abu, PA-C Orthopedic Surgery [phone] 04/26/2022, 12:41 PM

## 2022-04-26 NOTE — TOC Progression Note (Signed)
Transition of Care [facility]) - Progression Note    Patient Details  Name: [name] MRN: [id number] Date of Birth: March 31, 1946  [facility] CM/SW Contact  Tom-Johnson, Renea Ee, RN Phone Number: 04/26/2022, 4:24 PM  Clinical Narrative:     Home health referral called in to [facility], patient was active in 2020 and requests to use services again. [place] notified with acceptance noted, info on AVS. Continues IV abx. CM will continue to follow as patient progresses with care.         Expected Discharge Plan and Services                                                 Social Determinants of Health (SDOH) Interventions    Readmission Risk Interventions     No data to display

## 2022-04-26 NOTE — Consult Note (Addendum)
[facility]                                                                                       Patient Identification: Patient Name: [name] MRN: [id number] [place] Date: 04/20/2022  6:00 PM Today's Date: 04/26/2022 Reason for consult: Cellulitis Requesting provider: Jimmy Picket Arrien  Principal Problem:   Cellulitis of left lower extremity Active Problems:   ANXIETY DEPRESSION   Rheumatoid arthritis ([place])   ILD (interstitial lung disease) ([place])   Obstructive sleep apnea   Pulmonary hypertension ([place])   Hyperlipidemia LDL goal <70   Obese   Acute on chronic diastolic CHF (congestive heart failure) ([place])   Hypokalemia   Antibiotics:  Ceftriaxone 10/5-10/9 Fluconazole 10/5 Vancomycin 10/9-c  Lines/Hardware: Right THA and bilateral knee arthroplasty, left ankle hardware  Assessment 76 year old female with a PMH of asthma, anxiety, hypertension, GERD, ILD with oxygen dependence 7 L, pulmonary hypertension, RA on chronic prednisoneand leflunomide,  sleep apnea, IBS, CHFpEF,  right THA and bilateral knee arthroplasty, s/p 6 surgeries in the left ankle with hardware admitted with LLE cellulitis. ID engaged for worsening erythema on IV abtx.   Recommendations  CT left tibia and fibula + left foot  + CT left femur stat Orthopedics consult for +/- need for surgical intervention. Will add CPK Will switch ceftriaxone to Unasyn given h/o cat exposure and continue Vancomycin, pharmacy to dose.  Monitor for signs and symptoms of worsening infection/Nec fasc  Monitor CBC and following  D/w Dr Cathlean Sauer   Rest of the management as per the primary team. Please call with questions or concerns.  Thank you for the consult  Rosiland Oz, MD Infectious Disease Physician [facility] [address] [place], [place] [address] Phone:  [phone]  Fax: [phone]  __________________________________________________________________________________________________________ HPI and Hospital Course: 76 year old female with a PMH of asthma, anxiety, hypertension, GERD, ILD with oxygen dependence 7 L, pulmonary hypertension, RA on chronic prednisone and leflunomide,  sleep apnea, IBS, CHFpEF,  right THA and bilateral knee arthroplasty, s/p 6 surgeries in the left ankle with hardware, lumbar fusion who presented to the ED on 10/4 with shortness of breath and leg swelling for 4 to 5 days.  She had increased pain and swelling in her legs up to her knees left greater than right for the last several days.  She is taking Lasix as prescribed.  Denies any fevers chills, nausea or vomiting.  She also had an itchy rash to her anterior left shin followed by redness of the shin with weeping of some fluid.  Patient had also informed wound care nurse about using her husband's electric razor to shave her leg where her husband seem to press a bit harder than she would have.  This was followed by a red and painful leg the following morning. Husband also states that they have cats at home with concerns for ? Cat scratch ( although husband is the one who feeds the cat but cat tries to play with her and could have scratched in her legs when she did not pay attention). Denies smoking,  alcohol and IVDU  At ED, afebrile with tachypnea with oxygen sats 89% which improved on 7 L of high flow nasal cannula Chest x-ray with no acute abnormality.  Patient was given 1 dose of furosemide 40 mg IV in the ED Started on IV ceftriaxone initially resulting some improvement with addition of IV Vancomcyin 10/9 due to worsening redness 10/5 Vascular US negatuve for DVT in b/l legs  ROS: all systems reviewed with pertinent positives and negatives as listed above   Past Medical History:  Diagnosis Date   Acute asthmatic bronchitis    Allergic rhinitis    Anxiety     Arthritis    Esophageal reflux    Hypertension    Interstitial lung disease ([place]) dx jan 2020   Irritable bowel syndrome    Oxygen dependent    4 liters day time 6 liters at night   PONV (postoperative nausea and vomiting)    ponv likes zofran, and scopolamine patch   Pulmonary hypertension ([place])    Rheumatoid arthritis(714.0)    Sleep apnea    Past Surgical History:  Procedure Laterality Date    c secttion  1977   2 foot fusions Left    total of 6 left foot sx   ABDOMINAL HYSTERECTOMY     complete    ANKLE FUSION  2009   left   BACK SURGERY     lower l to l 5 fused   CHOLECYSTECTOMY     RIGHT HEART CATH N/A 11/09/2020   Procedure: RIGHT HEART CATH;  Surgeon: Jolaine Artist, MD;  Location: [facility];  Service: Cardiovascular;  Laterality: N/A;   RIGHT/LEFT HEART CATH AND CORONARY ANGIOGRAPHY N/A 09/16/2016   Procedure: Right/Left Heart Cath and Coronary Angiography;  Surgeon: Nelva Bush, MD;  Location: [facility];  Service: Cardiovascular;  Laterality: N/A;   RIGHT/LEFT HEART CATH AND CORONARY ANGIOGRAPHY N/A 12/17/2021   Procedure: RIGHT/LEFT HEART CATH AND CORONARY ANGIOGRAPHY;  Surgeon: Jolaine Artist, MD;  Location: [facility];  Service: Cardiovascular;  Laterality: N/A;   TONSILLECTOMY     TOTAL HIP ARTHROPLASTY Right 12/20/2018   Procedure: TOTAL HIP ARTHROPLASTY ANTERIOR APPROACH;  Surgeon: Paralee Cancel, MD;  Location: [facility];  Service: Orthopedics;  Laterality: Right;  70 mins   TOTAL KNEE ARTHROPLASTY Bilateral      Scheduled Meds:  aspirin EC  81 mg Oral Daily   colestipol  1 g Oral Daily   enoxaparin (LOVENOX) injection  40 mg Subcutaneous Q24H   FLUoxetine  20 mg Oral Daily   fluticasone  2 spray Each Nare Daily   fluticasone furoate-vilanterol  1 puff Inhalation Daily   And   umeclidinium bromide  1 puff Inhalation Daily   furosemide  40 mg Oral Daily   ipratropium  1 spray Each Nare BID   leflunomide  20 mg Oral Daily    loratadine  10 mg Oral Daily   montelukast  10 mg Oral QHS   pantoprazole  40 mg Oral Daily   Pirfenidone  801 mg Oral TID   predniSONE  5 mg Oral Q breakfast   rosuvastatin  20 mg Oral Daily   saccharomyces boulardii  250 mg Oral BID   sodium chloride flush  3 mL Intravenous Q12H   traZODone  100 mg Oral QHS   Treprostinil  1 puff Inhalation QID   verapamil  300 mg Oral Daily   Continuous Infusions:  cefTRIAXone (ROCEPHIN)  IV 2 g (04/25/22  1657)   vancomycin     PRN Meds:.acetaminophen **OR** acetaminophen, benzonatate, clidinium-chlordiazePOXIDE, dicyclomine, HYDROcodone-acetaminophen, ipratropium-albuterol, methocarbamol, morphine injection, Muscle Rub, polyvinyl alcohol  Allergies  Allergen Reactions   Cefdinir Diarrhea   Erythromycin Base Diarrhea   Lactose Dermatitis   Lactulose Diarrhea   Mesalamine Nausea Only   Methotrexate Other (See Comments)    Felt sick   Nitrofuran Derivatives     shakiness   Other Diarrhea and Other (See Comments)    Shaking uncontrollably "lettuce only" "lettuce only"   Sulfa Antibiotics Other (See Comments)    Patient can't recall reaction    Tdap [Tetanus-Diphth-Acell Pertussis]     Shaking uncontrollably   Tetanus Toxoid, Adsorbed Other (See Comments)    Shaking uncontrollable    Social History   Socioeconomic History   Marital status: Married    Spouse name: Not on file   Number of children: 2   Years of education: Not on file   Highest education level: Not on file  Occupational History   Not on file  Tobacco Use   Smoking status: Never   Smokeless tobacco: Never   Tobacco comments:    positive passive tobacco smoke exposure  Vaping Use   Vaping Use: Never used  Substance and Sexual Activity   Alcohol use: Not Currently    Alcohol/week: 7.0 standard drinks of alcohol    Types: 7 Standard drinks or equivalent per week    Comment: 1 alcohol drink each night   Drug use: No   Sexual activity: Yes  Other Topics Concern    Not on file  Social History Narrative   Exercise - at least 2 times weekly   Caffeine - 2 cups in the morning         Social Determinants of Health   Financial Resource Strain: Not on file  Food Insecurity: Not on file  Transportation Needs: Not on file  Physical Activity: Not on file  Stress: Not on file  Social Connections: Not on file  Intimate Partner Violence: Not on file    Family History  Problem Relation Age of Onset   Heart disease Mother    Arthritis Mother    Heart attack Father    Diabetes Other        sibling   Heart attack Other        sibling   Vitals BP (!) 160/80 (BP Location: Right Arm)   Pulse 96   Temp 98.3 F (36.8 C) (Oral)   Resp (!) 24   Ht 5' (1.524 m)   Wt 90.1 kg   SpO2 94%   BMI 38.79 kg/m    Physical Exam Constitutional:  sitting up in the recliner and on nasal cannula     Comments:   Cardiovascular:     Rate and Rhythm: Normal rate and regular rhythm.     Heart sounds:  Pulmonary:     Effort: Pulmonary effort is normal on [place]    Comments: Normal breath sounds   Abdominal:     Palpations: Abdomen is soft.     Tenderness: non tender and non distended   Musculoskeletal:        General: No swelling or tenderness in the peripheral joints including bilateral Prosthetic knee and rt THA  Left thigh mild erythema lateral thigh with induration   Left leg and foot - erythematous left anterior shin with some necrotic bullae in the anterolateral region. Mild tenderness and warmth but no crepitus and fluctuance noted. No active  drainage noted. Left foot is deformed in the setting of prior surgeries, Left foot is swollen but not much erythema and tenderness. ROM of left ankle and foot intact. Capillary refills 2+. Left DP difficult to palpate due to edema        Skin:    Comments: as above   Neurological:     General: grossly non focal, awake, alert and oriented   Psychiatric:        Mood and Affect: Mood normal.     Pertinent Microbiology Results for orders placed or performed during the hospital encounter of 11/06/20  SARS CORONAVIRUS 2 (TAT 6-24 HRS) Nasopharyngeal Nasopharyngeal Swab     Status: None   Collection Time: 11/06/20 11:22 AM   Specimen: Nasopharyngeal Swab  Result Value Ref Range Status   SARS Coronavirus 2 NEGATIVE NEGATIVE Final    Comment: (NOTE) SARS-CoV-2 target nucleic acids are NOT DETECTED.  The SARS-CoV-2 RNA is generally detectable in upper and lower respiratory specimens during the acute phase of infection. Negative results do not preclude SARS-CoV-2 infection, do not rule out co-infections with other pathogens, and should not be used as the sole basis for treatment or other patient management decisions. Negative results must be combined with clinical observations, patient history, and epidemiological information. The expected result is Negative.  Fact Sheet for Patients: SugarRoll.be  Fact Sheet for Healthcare Providers: https://www.woods-mathews.com/  This test is not yet approved or cleared by the [place] FDA and  has been authorized for detection and/or diagnosis of SARS-CoV-2 by FDA under an Emergency Use Authorization (EUA). This EUA will remain  in effect (meaning this test can be used) for the duration of the COVID-19 declaration under Se ction 564(b)(1) of the Act, 21 U.S.C. section 360bbb-3(b)(1), unless the authorization is terminated or revoked sooner.  Performed at [facility], [place] [address], [place], [place] [address]      Pertinent Lab seen by me:    Latest Ref Rng & Units 04/26/2022    2:41 AM 04/25/2022    3:25 AM 04/24/2022    1:07 AM  CBC  WBC 4.0 - 10.5 K/uL 4.8  6.9  8.9   Hemoglobin 12.0 - 15.0 g/dL 11.3  11.0  11.3   Hematocrit 36.0 - 46.0 % 36.2  35.5  35.1   Platelets 150 - 400 K/uL 142  123  116       Latest Ref Rng & Units 04/25/2022    3:25 AM 04/24/2022    1:07 AM  04/23/2022   12:53 AM  CMP  Glucose 70 - 99 mg/dL 140  135  194   BUN 8 - 23 mg/dL _0 Creatinine 0.44 - 1.00 mg/dL 0.91  0.85  1.17   Sodium 135 - 145 mmol/L 139  135  137   Potassium 3.5 - 5.1 mmol/L 4.4  3.4  3.8   Chloride 98 - 111 mmol/L 100  94  96   CO2 22 - 32 mmol/L 36  34  33   Calcium 8.9 - 10.3 mg/dL 9.8  9.5  9.4      Pertinent Imagings/Other Imagings Plain films and CT images have been personally visualized and interpreted; radiology reports have been reviewed. Decision making incorporated into the Impression / Recommendations.  VAS [place] LOWER EXTREMITY VENOUS (DVT)  Result Date: 04/21/2022  Lower Venous DVT Study Patient Name:  ZELENE BARGA  Date of Exam:   04/21/2022 Medical Rec #: [id number]  Accession #:    [id number] Date of Birth: May 31, 1946       Patient Gender: F Patient Age:   89 years Exam Location:  [facility] Procedure:      VAS [place] LOWER EXTREMITY VENOUS (DVT) Referring Phys: Fuller Plan --------------------------------------------------------------------------------  Indications: Pain, and Edema.  Limitations: Body habitus and edema, pain with compression. Comparison Study: Prior negative left LEV done 04/07/16 Performing Technologist: Sharion Dove RVS  Examination Guidelines: A complete evaluation includes B-mode imaging, spectral Doppler, color Doppler, and power Doppler as needed of all accessible portions of each vessel. Bilateral testing is considered an integral part of a complete examination. Limited examinations for reoccurring indications may be performed as noted. The reflux portion of the exam is performed with the patient in reverse Trendelenburg.  +---------+---------------+---------+-----------+----------+-------------------+ RIGHT    CompressibilityPhasicitySpontaneityPropertiesThrombus Aging      +---------+---------------+---------+-----------+----------+-------------------+ CFV      Full           Yes      Yes                                       +---------+---------------+---------+-----------+----------+-------------------+ SFJ      Full                                                             +---------+---------------+---------+-----------+----------+-------------------+ FV Prox  Full                                                             +---------+---------------+---------+-----------+----------+-------------------+ FV Mid                  Yes      Yes                  patent by color and                                                       Doppler             +---------+---------------+---------+-----------+----------+-------------------+ FV Distal                                             patent by color and                                                       Doppler             +---------+---------------+---------+-----------+----------+-------------------+ PFV      Full                                                             +---------+---------------+---------+-----------+----------+-------------------+  POP      Full           No       Yes                                      +---------+---------------+---------+-----------+----------+-------------------+ PTV      Full                                                             +---------+---------------+---------+-----------+----------+-------------------+ PERO                                                  Not well visualized +---------+---------------+---------+-----------+----------+-------------------+   +---------+---------------+---------+-----------+----------+-------------------+ LEFT     CompressibilityPhasicitySpontaneityPropertiesThrombus Aging      +---------+---------------+---------+-----------+----------+-------------------+ CFV      Full           Yes      Yes                                       +---------+---------------+---------+-----------+----------+-------------------+ SFJ      Full                                                             +---------+---------------+---------+-----------+----------+-------------------+ FV Prox  Full           Yes      Yes                                      +---------+---------------+---------+-----------+----------+-------------------+ FV Mid                  Yes      Yes                  patent by color and                                                       Doppler             +---------+---------------+---------+-----------+----------+-------------------+ FV Distal               No       Yes                  patent by color and  Doppler             +---------+---------------+---------+-----------+----------+-------------------+ PFV      Full                                                             +---------+---------------+---------+-----------+----------+-------------------+ POP                     Yes      Yes                  patent by color and                                                       Doppler             +---------+---------------+---------+-----------+----------+-------------------+ PTV      Full           Yes      Yes                                      +---------+---------------+---------+-----------+----------+-------------------+ PERO                                                  Not well visualized +---------+---------------+---------+-----------+----------+-------------------+     Summary: RIGHT: - There is no evidence of deep vein thrombosis in the lower extremity. However, portions of this examination were limited- see technologist comments above.  - No cystic structure found in the popliteal fossa.  LEFT: - There is no evidence of deep vein thrombosis in the lower extremity. However, portions of  this examination were limited- see technologist comments above.  - No cystic structure found in the popliteal fossa.  *See table(s) above for measurements and observations. Electronically signed by Jamelle Haring on 04/21/2022 at 4:42:36 PM.    Final    DG Chest Port 1 View  Result Date: 04/20/2022 CLINICAL DATA:  Shortness of breath EXAM: PORTABLE CHEST 1 VIEW COMPARISON:  CT chest dated 05/14/2021 FINDINGS: Scarring in the right perihilar region and lingula. Left lung base is obscured, likely due to prominent epicardial fat when correlating with CT. No definite pleural effusions or pneumothorax. The heart is top-normal in size. IMPRESSION: No evidence of acute cardiopulmonary disease. Electronically Signed   By: Julian Hy M.D.   On: 04/20/2022 19:46    I spent  100 minutes for this patient encounter including review of prior medical records/discussing diagnostics and treatment plan with the patient/family/coordinate care with primary/other specialits with greater than 50% of time in face to face encounter.   Electronically signed by:   Rosiland Oz, MD Infectious Disease Physician [facility] Pager: [phone]

## 2022-04-26 NOTE — Care Management Important Message (Signed)
Important Message  Patient Details  Name: [name] MRN: [id number] Date of Birth: 07-12-1946   Medicare Important Message Given:  Yes     Shelda Altes 04/26/2022, 10:27 AM

## 2022-04-27 DIAGNOSIS — L03116 Cellulitis of left lower limb: Secondary | ICD-10-CM | POA: Diagnosis not present

## 2022-04-27 LAB — BASIC METABOLIC PANEL
Anion gap: 11 (ref 5–15)
BUN: 15 mg/dL (ref 8–23)
CO2: 31 mmol/L (ref 22–32)
Calcium: 10.6 mg/dL — ABNORMAL HIGH (ref 8.9–10.3)
Chloride: 98 mmol/L (ref 98–111)
Creatinine, Ser: 0.76 mg/dL (ref 0.44–1.00)
GFR, Estimated: >60 mL/min (ref >60)
Glucose, Bld: 159 mg/dL — ABNORMAL HIGH (ref 70–99)
Potassium: 3.2 mmol/L — ABNORMAL LOW (ref 3.5–5.1)
Sodium: 140 mmol/L (ref 135–145)

## 2022-04-27 MED ORDER — LOPERAMIDE HCL 2 MG PO CAPS
2.0000 mg | ORAL_CAPSULE | Freq: Once | ORAL | Status: AC
  Administered 2022-04-27: 2 mg via ORAL
  Filled 2022-04-27: qty 1

## 2022-04-27 NOTE — Plan of Care (Signed)
  Problem: Coping: Goal: Level of anxiety will decrease Outcome: Progressing   Problem: Elimination: Goal: Will not experience complications related to urinary retention Outcome: Progressing   Problem: Safety: Goal: Ability to remain free from injury will improve Outcome: Progressing

## 2022-04-27 NOTE — Progress Notes (Addendum)
PROGRESS NOTE    [name]  KVQ:[id number] DOB: 28-Dec-1945 DOA: 04/20/2022 PCP: Lorene Dy, MD  76/F with history of chronic hypoxic respiratory failure on 7 L home O2, interstitial lung disease, rheumatoid arthritis, chronic diastolic CHF presented to the emergency room with dyspnea, lower extremity edema especially left leg including erythema tenderness and subjective fevers. -Chest x-ray noted mild pulmonary vascular congestion, she was admitted started on IV Lasix and antibiotic therapy for cellulitis. -Over the next 2 to 3 days she had worsening left leg changes with areas of black discoloration, vancomycin added, infectious disease was consulted, CT tib-fib obtained, no underlying abscess or fluid collection, changes of cellulitis noted, orthopedics was consulted as well recommended antibiotic therapy   Subjective: Feels okay overall, bothered by appearance of left leg  Assessment and Plan:  Cellulitis of left lower extremity -Had clinical worsening over the last few days with some purulence and possible necrotic changes -Immunocompromised on baseline prednisone, hold leflunomide at this time -ID consulting, now on IV vancomycin and Unasyn -CT negative for abscess, osteomyelitis, upper.  Orthopedics input, medical management recommended -Pain control, PT OT  Acute on chronic diastolic CHF Pulmonary hypertension Echo EF 55 to 64%, RV systolic function preserved.  -Diuresed with IV Lasix earlier this admission, appears euvolemic at this time -Continue oral Lasix No SGLT 2 ing due to skin infection.  Her volume status has improved, transition to oral furosemide with good toleration. Continue blood pressure monitoring.   ILD (interstitial lung disease) (HCC) Chronic hypoxemic respiratory failure.  -On 7 L home O2 at baseline -Continue trilogy, Tyvaso, PPI, Singulair -CPAP nightly -followed by [facility]  Pulmonary hypertension ([place]) Continue with treprostinil and  verapamil.   Hyperlipidemia LDL goal <70 Continue with statin therapy.   ANXIETY DEPRESSION Continue with fluoxetine and trazodone.   Rheumatoid arthritis (HCC) Continue withy oral prednisone, leflunamide  Obese Calculated BMI is 39,6 consistent with obesity class 2.   Hypokalemia -Replaced  DVT prophylaxis: Lovenox Code Status: Full code Family Communication: Discussed patient detail no family at bedside Disposition Plan: Home likely 3 to 4 days  Consultants: Infectious disease and orthopedics   Procedures:   Antimicrobials:    Objective: Vitals:   04/26/22 1946 04/26/22 1947 04/27/22 0426 04/27/22 0804  BP:  (!) 150/79 (!) 146/80   Pulse: 84 89 81   Resp: 19 (!) 21 17   Temp:  98 F (36.7 C) 97.9 F (36.6 C)   TempSrc:  Oral Oral   SpO2: 95% 94% 98% 92%  Weight:   88.4 kg   Height:        Intake/Output Summary (Last 24 hours) at 04/27/2022 1003 Last data filed at 04/27/2022 3329 Gross per 24 hour  Intake 720 ml  Output 3250 ml  Net -2530 ml   Filed Weights   04/25/22 0400 04/26/22 0400 04/27/22 0426  Weight: 89.9 kg 90.1 kg 88.4 kg    Examination:  General exam: Obese chronically ill female sitting up in bed, AAOx3 HEENT: No JVD CVS: S1-S2, regular rhythm Lungs: Poor air movement bilaterally, bilateral Rales Abdomen: Soft, nontender, bowel sounds present Extremities with redness swelling warmth and tenderness involving left lower leg, areas of dark discoloration Psychiatry:  Mood & affect appropriate.     Data Reviewed:   CBC: Recent Labs  Lab 04/20/22 1820 04/21/22 1047 04/22/22 0042 04/23/22 0053 04/24/22 0107 04/25/22 0325 04/26/22 0241  WBC 11.6* 10.6* 11.1* 10.0 8.9 6.9 4.8  NEUTROABS 10.5* 9.8*  --   --   --   --   --  HGB 12.3 12.4 11.7* 11.4* 11.3* 11.0* 11.3*  HCT 38.6 38.5 36.1 35.6* 35.1* 35.5* 36.2  MCV 100.5* 99.0 98.1 100.3* 99.4 101.1* 100.0  PLT 180 138* 125* 113* 116* 123* 989*   Basic Metabolic Panel: Recent  Labs  Lab 04/20/22 1820 04/22/22 0042 04/23/22 0053 04/24/22 0107 04/25/22 0325  NA 136 135 137 135 139  K 3.6 3.1* 3.8 3.4* 4.4  CL 99 95* 96* 94* 100  CO2 30 32 33* 34* 36*  GLUCOSE 145* 139* 194* 135* 140*  BUN _0 CREATININE 0.94 0.90 1.17* 0.85 0.91  CALCIUM 9.7 9.3 9.4 9.5 9.8   GFR: Estimated Creatinine Clearance: 50.4 mL/min (by C-G formula based on SCr of 0.91 mg/dL). Liver Function Tests: Recent Labs  Lab 04/20/22 1820  AST 16  ALT 16  ALKPHOS 67  BILITOT 0.6  PROT 5.7*  ALBUMIN 3.8   No results for input(s): "LIPASE", "AMYLASE" in the last 168 hours. No results for input(s): "AMMONIA" in the last 168 hours. Coagulation Profile: No results for input(s): "INR", "PROTIME" in the last 168 hours. Cardiac Enzymes: Recent Labs  Lab 04/26/22 1844  CKTOTAL 32*   BNP (last 3 results) Recent Labs    12/09/21 1440  PROBNP 61.0   HbA1C: No results for input(s): "HGBA1C" in the last 72 hours. CBG: No results for input(s): "GLUCAP" in the last 168 hours. Lipid Profile: No results for input(s): "CHOL", "HDL", "LDLCALC", "TRIG", "CHOLHDL", "LDLDIRECT" in the last 72 hours. Thyroid Function Tests: No results for input(s): "TSH", "T4TOTAL", "FREET4", "T3FREE", "THYROIDAB" in the last 72 hours. Anemia Panel: No results for input(s): "VITAMINB12", "FOLATE", "FERRITIN", "TIBC", "IRON", "RETICCTPCT" in the last 72 hours. Urine analysis:    Component Value Date/Time   COLORURINE YELLOW 07/31/2013 [place] 07/31/2013 1733   LABSPEC 1.025 07/31/2013 1733   PHURINE 5.5 07/31/2013 1733   GLUCOSEU NEGATIVE 07/31/2013 1733   HGBUR NEGATIVE 07/31/2013 1733   BILIRUBINUR NEGATIVE 07/31/2013 1733   KETONESUR NEGATIVE 07/31/2013 1733   PROTEINUR 30 (A) 07/31/2013 1733   UROBILINOGEN 0.2 07/31/2013 1733   NITRITE NEGATIVE 07/31/2013 1733   LEUKOCYTESUR TRACE (A) 07/31/2013 1733   Sepsis Labs: _1 (procalcitonin:4,lacticidven:4)  )No  results found for this or any previous visit (from the past 240 hour(s)).   Radiology Studies: CT TIBIA FIBULA LEFT W CONTRAST  Result Date: 04/26/2022 CLINICAL DATA:  Thigh, calf, and ankle cellulitis. Worsening redness. Evaluate for underlying deep infection or abscess. EXAM: CT OF THE LOWER LEFT EXTREMITY WITH CONTRAST TECHNIQUE: Multidetector CT imaging of the lower left extremity was performed according to the standard protocol following intravenous contrast administration. RADIATION DOSE REDUCTION: This exam was performed according to the departmental dose-optimization program which includes automated exposure control, adjustment of the mA and/or kV according to patient size and/or use of iterative reconstruction technique. CONTRAST:  13m OMNIPAQUE IOHEXOL 350 MG/ML SOLN COMPARISON:  Left knee radiographs 09/22/2012 FINDINGS: Left femur: Bones/Joint/Cartilage There is diffuse decreased bone mineralization. Moderate joint space narrowing and peripheral osteophytosis of the pubic symphysis. Moderate superior left femoroacetabular joint space narrowing. Mild superior lateral left acetabular degenerative osteophytosis. Within the limitations of diffuse decreased bone mineralization, no acute fracture is seen. No definite cortical erosion is seen. Left sacroiliac joint partially visualized subchondral sclerosis and vacuum phenomenon. Within the limitations of metallic streak artifact from total left knee arthroplasty, no definite perihardware lucency is seen to indicate hardware failure or loosening. Ligaments Suboptimally assessed by CT. Muscles and Tendons  There is mild-to-moderate fatty infiltration of the left semimembranosus muscle in the long head of the rectus femoris muscle with possible minimal muscle atrophy. No gross tendon tear is visualized. Soft tissues There is mild soft tissue swelling and moderate edema/fat stranding seen throughout the left calf, greatest within the anterolateral proximal,  mid, and distal left thigh and next greatest within the posteromedial mid to distal left thigh. No walled-off fluid collection is seen. Moderate to high-grade atherosclerotic calcifications. There is minimal partial visualization of a high-grade sigmoid and descending colon diverticulosis. -- Left tibia and fibula: Bones/Joint/Cartilage Within the limitations of metallic streak artifact, no perihardware lucency is seen around the total left knee arthroplasty hardware to indicate hardware failure or loosening. Additional anterolateral distal tibial plate and screw fixation hardware without definite hardware complication. No acute fracture or cortical erosion is seen. Ligaments Suboptimally assessed by CT. Muscles and Tendons There is diffuse fatty infiltration seen throughout the anterior, lateral, deep posterior, and mid to distal aspect of the superficial posterior muscle compartments of the left calf. Soft tissues There is moderate swelling and edema/stranding seen throughout the left calf subcutaneous fat diffusely. No walled-off abscess is visualized. There are moderate to high-grade atherosclerotic calcifications. -- Left foot: Bones/Joint/Cartilage There is distal anterior tibial plate and screw fixation hardware that is also involves screws traversing the talar neck. 2/3 of the screws are fractured (sagittal series 22 images 47 through 49). There are additional screws traversing the medial cuneiform through the anterior process of the calcaneus and lateral cuneiform through the anterior process of the calcaneus. Old screw tracts from prior remote posterior subtalar arthrodesis screws. There is arthrodesis of the posterior, medial, and anterior subtalar joints, talonavicular joint, and the calcaneocuboid joint. High-grade joint space narrowing throughout the tarsometatarsal joints and the tibiotalar joint without osseous fusion. Additional screws are visualized within the second through fourth toes, spanning  the PIP and DIP joints. No definite cortical erosion. Ligaments Suboptimally assessed by CT. Muscles and Tendons High-grade diffuse muscle atrophy. Soft tissues Mild edema and swelling of the anterior, medial, and lateral ankle and dorsal midfoot subcutaneous fat diffusely. No walled-off fluid collection is seen. IMPRESSION: 1. No definite cortical erosion within the left femur, tibia/fibula, or left foot. 2. Diffuse left lower extremity subcutaneous fat edema and swelling compatible with cellulitis. No walled-off fluid collection is seen to indicate abscess. 3. 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 No evidence of hardware failure outside of remote fracturing of two screws within the distal aspect of the distal tibia-midfoot plate and screw fusion hardware. Electronically Signed   By: [name] M.D.   On: 04/26/2022 15:30   CT FOOT LEFT W CONTRAST  Result Date: 04/26/2022 CLINICAL DATA:  Thigh, calf, and ankle cellulitis. Worsening redness. Evaluate for underlying deep infection or abscess. EXAM: CT OF THE LOWER LEFT EXTREMITY WITH CONTRAST TECHNIQUE: Multidetector CT imaging of the lower left extremity was performed according to the standard protocol following intravenous contrast administration. RADIATION DOSE REDUCTION: This exam was performed according to the departmental dose-optimization program which includes automated exposure control, adjustment of the mA and/or kV according to patient size and/or use of iterative reconstruction technique. CONTRAST:  14m OMNIPAQUE IOHEXOL 350 MG/ML SOLN COMPARISON:  Left knee radiographs 09/22/2012 FINDINGS: Left femur: Bones/Joint/Cartilage There is diffuse decreased bone mineralization. Moderate joint space narrowing and peripheral osteophytosis of the pubic symphysis. Moderate superior left femoroacetabular joint space narrowing. Mild superior lateral left acetabular degenerative osteophytosis. Within the limitations  of diffuse decreased bone mineralization, no acute fracture is seen. No definite cortical erosion is seen. Left sacroiliac joint partially visualized subchondral sclerosis and vacuum phenomenon. Within the limitations of metallic streak artifact from total left knee arthroplasty, no definite perihardware lucency is seen to indicate hardware failure or loosening. Ligaments Suboptimally assessed by CT. Muscles and Tendons There is mild-to-moderate fatty infiltration of the left semimembranosus muscle in the long head of the rectus femoris muscle with possible minimal muscle atrophy. No gross tendon tear is visualized. Soft tissues There is mild soft tissue swelling and moderate edema/fat stranding seen throughout the left calf, greatest within the anterolateral proximal, mid, and distal left thigh and next greatest within the posteromedial mid to distal left thigh. No walled-off fluid collection is seen. Moderate to high-grade atherosclerotic calcifications. There is minimal partial visualization of a high-grade sigmoid and descending colon diverticulosis. -- Left tibia and fibula: Bones/Joint/Cartilage Within the limitations of metallic streak artifact, no perihardware lucency is seen around the total left knee arthroplasty hardware to indicate hardware failure or loosening. Additional anterolateral distal tibial plate and screw fixation hardware without definite hardware complication. No acute fracture or cortical erosion is seen. Ligaments Suboptimally assessed by CT. Muscles and Tendons There is diffuse fatty infiltration seen throughout the anterior, lateral, deep posterior, and mid to distal aspect of the superficial posterior muscle compartments of the left calf. Soft tissues There is moderate swelling and edema/stranding seen throughout the left calf subcutaneous fat diffusely. No walled-off abscess is visualized. There are moderate to high-grade atherosclerotic calcifications. -- Left foot:  Bones/Joint/Cartilage There is distal anterior tibial plate and screw fixation hardware that is also involves screws traversing the talar neck. 2/3 of the screws are fractured (sagittal series 22 images 47 through 49). There are additional screws traversing the medial cuneiform through the anterior process of the calcaneus and lateral cuneiform through the anterior process of the calcaneus. Old screw tracts from prior remote posterior subtalar arthrodesis screws. There is arthrodesis of the posterior, medial, and anterior subtalar joints, talonavicular joint, and the calcaneocuboid joint. High-grade joint space narrowing throughout the tarsometatarsal joints and the tibiotalar joint without osseous fusion. Additional screws are visualized within the second through fourth toes, spanning the PIP and DIP joints. No definite cortical erosion. Ligaments Suboptimally assessed by CT. Muscles and Tendons High-grade diffuse muscle atrophy. Soft tissues Mild edema and swelling of the anterior, medial, and lateral ankle and dorsal midfoot subcutaneous fat diffusely. No walled-off fluid collection is seen. IMPRESSION: 1. No definite cortical erosion within the left femur, tibia/fibula, or left foot. 2. Diffuse left lower extremity subcutaneous fat edema and swelling compatible with cellulitis. No walled-off fluid collection is seen to indicate abscess. 3. 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 No evidence of hardware failure outside of remote fracturing of two screws within the distal aspect of the distal tibia-midfoot plate and screw fusion hardware. Electronically Signed   By: [name] M.D.   On: 04/26/2022 15:30   CT FEMUR LEFT W CONTRAST  Result Date: 04/26/2022 CLINICAL DATA:  Thigh, calf, and ankle cellulitis. Worsening redness. Evaluate for underlying deep infection or abscess. EXAM: CT OF THE LOWER LEFT EXTREMITY WITH CONTRAST TECHNIQUE: Multidetector CT imaging of the  lower left extremity was performed according to the standard protocol following intravenous contrast administration. RADIATION DOSE REDUCTION: This exam was performed according to the departmental dose-optimization program which includes automated exposure control, adjustment of the mA and/or kV according to patient size and/or  use of iterative reconstruction technique. CONTRAST:  16m OMNIPAQUE IOHEXOL 350 MG/ML SOLN COMPARISON:  Left knee radiographs 09/22/2012 FINDINGS: Left femur: Bones/Joint/Cartilage There is diffuse decreased bone mineralization. Moderate joint space narrowing and peripheral osteophytosis of the pubic symphysis. Moderate superior left femoroacetabular joint space narrowing. Mild superior lateral left acetabular degenerative osteophytosis. Within the limitations of diffuse decreased bone mineralization, no acute fracture is seen. No definite cortical erosion is seen. Left sacroiliac joint partially visualized subchondral sclerosis and vacuum phenomenon. Within the limitations of metallic streak artifact from total left knee arthroplasty, no definite perihardware lucency is seen to indicate hardware failure or loosening. Ligaments Suboptimally assessed by CT. Muscles and Tendons There is mild-to-moderate fatty infiltration of the left semimembranosus muscle in the long head of the rectus femoris muscle with possible minimal muscle atrophy. No gross tendon tear is visualized. Soft tissues There is mild soft tissue swelling and moderate edema/fat stranding seen throughout the left calf, greatest within the anterolateral proximal, mid, and distal left thigh and next greatest within the posteromedial mid to distal left thigh. No walled-off fluid collection is seen. Moderate to high-grade atherosclerotic calcifications. There is minimal partial visualization of a high-grade sigmoid and descending colon diverticulosis. -- Left tibia and fibula: Bones/Joint/Cartilage Within the limitations of metallic  streak artifact, no perihardware lucency is seen around the total left knee arthroplasty hardware to indicate hardware failure or loosening. Additional anterolateral distal tibial plate and screw fixation hardware without definite hardware complication. No acute fracture or cortical erosion is seen. Ligaments Suboptimally assessed by CT. Muscles and Tendons There is diffuse fatty infiltration seen throughout the anterior, lateral, deep posterior, and mid to distal aspect of the superficial posterior muscle compartments of the left calf. Soft tissues There is moderate swelling and edema/stranding seen throughout the left calf subcutaneous fat diffusely. No walled-off abscess is visualized. There are moderate to high-grade atherosclerotic calcifications. -- Left foot: Bones/Joint/Cartilage There is distal anterior tibial plate and screw fixation hardware that is also involves screws traversing the talar neck. 2/3 of the screws are fractured (sagittal series 22 images 47 through 49). There are additional screws traversing the medial cuneiform through the anterior process of the calcaneus and lateral cuneiform through the anterior process of the calcaneus. Old screw tracts from prior remote posterior subtalar arthrodesis screws. There is arthrodesis of the posterior, medial, and anterior subtalar joints, talonavicular joint, and the calcaneocuboid joint. High-grade joint space narrowing throughout the tarsometatarsal joints and the tibiotalar joint without osseous fusion. Additional screws are visualized within the second through fourth toes, spanning the PIP and DIP joints. No definite cortical erosion. Ligaments Suboptimally assessed by CT. Muscles and Tendons High-grade diffuse muscle atrophy. Soft tissues Mild edema and swelling of the anterior, medial, and lateral ankle and dorsal midfoot subcutaneous fat diffusely. No walled-off fluid collection is seen. IMPRESSION: 1. No definite cortical erosion within the left  femur, tibia/fibula, or left foot. 2. Diffuse left lower extremity subcutaneous fat edema and swelling compatible with cellulitis. No walled-off fluid collection is seen to indicate abscess. 3. 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 No evidence of hardware failure outside of remote fracturing of two screws within the distal aspect of the distal tibia-midfoot plate and screw fusion hardware. Electronically Signed   By: RYvonne KendallM.D.   On: 04/26/2022 15:30     Scheduled Meds:  aspirin EC  81 mg Oral Daily   colestipol  1 g Oral Daily   enoxaparin (LOVENOX) injection  40  mg Subcutaneous Q24H   FLUoxetine  20 mg Oral Daily   fluticasone  2 spray Each Nare Daily   fluticasone furoate-vilanterol  1 puff Inhalation Daily   And   umeclidinium bromide  1 puff Inhalation Daily   furosemide  40 mg Oral Daily   ipratropium  1 spray Each Nare BID   leflunomide  20 mg Oral Daily   loratadine  10 mg Oral Daily   montelukast  10 mg Oral QHS   pantoprazole  40 mg Oral Daily   Pirfenidone  801 mg Oral TID   predniSONE  5 mg Oral Q breakfast   rosuvastatin  20 mg Oral Daily   saccharomyces boulardii  250 mg Oral BID   sodium chloride flush  3 mL Intravenous Q12H   traZODone  100 mg Oral QHS   Treprostinil  1 puff Inhalation QID   verapamil  300 mg Oral Daily   Continuous Infusions:  ampicillin-sulbactam (UNASYN) IV 3 g (04/27/22 5520)   vancomycin 750 mg (04/27/22 0938)     LOS: 5 days    Time spent: 37mn    PDomenic Polite MD [facility]   04/27/2022, 10:03 AM

## 2022-04-27 NOTE — Consult Note (Signed)
ORTHOPAEDIC CONSULTATION  REQUESTING PHYSICIAN: Domenic Polite, MD  Chief Complaint: Cellulitis left leg.  HPI: [name] is a 76 y.o. female who presents with cellulitis left leg.  Despite IV antibiotics patient still has progressive cellulitis.  Past Medical History:  Diagnosis Date   Acute asthmatic bronchitis    Allergic rhinitis    Anxiety    Arthritis    Esophageal reflux    Hypertension    Interstitial lung disease ([place]) dx jan 2020   Irritable bowel syndrome    Oxygen dependent    4 liters day time 6 liters at night   PONV (postoperative nausea and vomiting)    ponv likes zofran, and scopolamine patch   Pulmonary hypertension ([place])    Rheumatoid arthritis(714.0)    Sleep apnea    Past Surgical History:  Procedure Laterality Date    c secttion  1977   2 foot fusions Left    total of 6 left foot sx   ABDOMINAL HYSTERECTOMY     complete    ANKLE FUSION  2009   left   BACK SURGERY     lower l to l 5 fused   CHOLECYSTECTOMY     RIGHT HEART CATH N/A 11/09/2020   Procedure: RIGHT HEART CATH;  Surgeon: Jolaine Artist, MD;  Location: [facility];  Service: Cardiovascular;  Laterality: N/A;   RIGHT/LEFT HEART CATH AND CORONARY ANGIOGRAPHY N/A 09/16/2016   Procedure: Right/Left Heart Cath and Coronary Angiography;  Surgeon: Nelva Bush, MD;  Location: [facility] [facility];  Service: Cardiovascular;  Laterality: N/A;   RIGHT/LEFT HEART CATH AND CORONARY ANGIOGRAPHY N/A 12/17/2021   Procedure: RIGHT/LEFT HEART CATH AND CORONARY ANGIOGRAPHY;  Surgeon: Jolaine Artist, MD;  Location: [facility];  Service: Cardiovascular;  Laterality: N/A;   TONSILLECTOMY     TOTAL HIP ARTHROPLASTY Right 12/20/2018   Procedure: TOTAL HIP ARTHROPLASTY ANTERIOR APPROACH;  Surgeon: Paralee Cancel, MD;  Location: [facility];  Service: Orthopedics;  Laterality: Right;  70 mins   TOTAL KNEE ARTHROPLASTY Bilateral    Social History   Socioeconomic History   Marital  status: Married    Spouse name: Not on file   Number of children: 2   Years of education: Not on file   Highest education level: Not on file  Occupational History   Not on file  Tobacco Use   Smoking status: Never   Smokeless tobacco: Never   Tobacco comments:    positive passive tobacco smoke exposure  Vaping Use   Vaping Use: Never used  Substance and Sexual Activity   Alcohol use: Not Currently    Alcohol/week: 7.0 standard drinks of alcohol    Types: 7 Standard drinks or equivalent per week    Comment: 1 alcohol drink each night   Drug use: No   Sexual activity: Yes  Other Topics Concern   Not on file  Social History Narrative   Exercise - at least 2 times weekly   Caffeine - 2 cups in the morning         Social Determinants of Health   Financial Resource Strain: Not on file  Food Insecurity: Not on file  Transportation Needs: Not on file  Physical Activity: Not on file  Stress: Not on file  Social Connections: Not on file   Family History  Problem Relation Age of Onset   Heart disease Mother    Arthritis Mother    Heart attack Father  Diabetes Other        sibling   Heart attack Other        sibling   - negative except otherwise stated in the family history section Allergies  Allergen Reactions   Cefdinir Diarrhea   Erythromycin Base Diarrhea   Lactose Dermatitis   Lactulose Diarrhea   Mesalamine Nausea Only   Methotrexate Other (See Comments)    Felt sick   Nitrofuran Derivatives     shakiness   Other Diarrhea and Other (See Comments)    Shaking uncontrollably "lettuce only" "lettuce only"   Sulfa Antibiotics Other (See Comments)    Patient can't recall reaction    Tdap [Tetanus-Diphth-Acell Pertussis]     Shaking uncontrollably   Tetanus Toxoid, Adsorbed Other (See Comments)    Shaking uncontrollable    Prior to Admission medications   Medication Sig Start Date End Date Taking? Authorizing Provider  albuterol (VENTOLIN HFA) 108 (90  Base) MCG/ACT inhaler USE 2 INHALATIONS BY MOUTH  EVERY 6 HOURS AS NEEDED Patient taking differently: Inhale 2 puffs into the lungs every 6 (six) hours as needed for wheezing or shortness of breath. 01/10/22  Yes Brand Males, MD  aspirin EC 81 MG tablet Take 81 mg by mouth daily.   Yes [provider]  benzonatate (TESSALON) 200 MG capsule TAKE 1 CAPSULE (200 MG TOTAL) BY MOUTH 3 (THREE) TIMES DAILY AS NEEDED FOR COUGH. 04/07/22  Yes Brand Males, MD  celecoxib (CELEBREX) 200 MG capsule Take 200 mg by mouth daily. 03/30/20  Yes [provider]  cetirizine (ZYRTEC) 10 MG tablet Take 20 mg by mouth daily.   Yes [provider]  Cholecalciferol (VITAMIN D3) 250 MCG (10000 UT) capsule Take 10,000 Units by mouth daily.   Yes [provider]  clidinium-chlordiazePOXIDE (LIBRAX) 5-2.5 MG capsule Take 1 capsule by mouth 3 (three) times daily as needed (for IBS).   Yes [provider]  colestipol (COLESTID) 1 g tablet Take 1 g by mouth daily.   Yes [provider]  dicyclomine (BENTYL) 20 MG tablet Take 20 mg by mouth 4 (four) times daily as needed (IBS).   Yes [provider]  FLUoxetine (PROZAC) 20 MG capsule Take 20 mg by mouth daily. 08/11/16  Yes [provider]  fluticasone (FLONASE) 50 MCG/ACT nasal spray SPRAY 2 SPRAYS INTO EACH NOSTRIL EVERY DAY Patient taking differently: Place 2 sprays into both nostrils daily. 04/01/20  Yes Lauraine Rinne, NP  furosemide (LASIX) 40 MG tablet TAKE 1 TABLET BY MOUTH DAILY 03/03/22  Yes End, Harrell Gave, MD  ipratropium (ATROVENT) 0.03 % nasal spray Use 1-2 sprays in both nostrils twice daily. 07/14/21  Yes Brand Males, MD  ipratropium-albuterol (DUONEB) 0.5-2.5 (3) MG/3ML SOLN USE 1 VIAL IN NEBULIZER EVERY 6 HOURS AS NEEDED J45.901 Patient taking differently: Take 3 mLs by nebulization every 6 (six) hours as needed (wheezing, shortness of breath). 08/17/21  Yes Brand Males, MD   leflunomide (ARAVA) 20 MG tablet Take 20 mg by mouth daily.  03/24/16  Yes [provider]  Menthol, Topical Analgesic, (BIOFREEZE COOL THE PAIN) 4 % GEL Apply 1 Application topically 2 (two) times daily as needed (pain).   Yes [provider]  methocarbamol (ROBAXIN) 750 MG tablet Take 750 mg by mouth 2 (two) times daily as needed for muscle spasms. 07/22/20  Yes [provider]  montelukast (SINGULAIR) 10 MG tablet Take 1 tablet (10 mg total) by mouth at bedtime. 07/14/21  Yes Brand Males, MD  omeprazole (PRILOSEC) 40 MG capsule Take 40 mg by mouth at bedtime.    Yes [provider]  Oxycodone HCl 10 MG TABS Take 10 mg by mouth 4 (four) times daily. 04/13/22  Yes [provider]  OXYGEN Inhale 7 L into the lungs continuous.   Yes [provider]  Pirfenidone (ESBRIET) 801 MG TABS Take 801 mg by mouth 3 (three) times daily. 07/15/21  Yes Brand Males, MD  potassium chloride SA (KLOR-CON M20) 20 MEQ tablet Take 1 tablet (20 mEq total) by mouth 2 (two) times daily. 03/03/22  Yes End, Harrell Gave, MD  predniSONE (DELTASONE) 5 MG tablet Take 5 mg by mouth daily with breakfast.   Yes [provider]  rosuvastatin (CRESTOR) 20 MG tablet Take 1 tablet (20 mg total) by mouth daily. 03/09/22  Yes End, Harrell Gave, MD  Tocilizumab (ACTEMRA IV) Inject into the vein every 30 (thirty) days. infusion   Yes [provider]  traMADol (ULTRAM) 50 MG tablet Take 50 mg by mouth 3 (three) times daily as needed for moderate pain. 07/03/20  Yes [provider]  traZODone (DESYREL) 100 MG tablet Take 100 mg by mouth at bedtime.   Yes [provider]  TRELEGY ELLIPTA 100-62.5-25 MCG/ACT AEPB USE 1 INHALATION BY MOUTH DAILY Patient taking differently: Inhale 1 puff into the lungs daily. 12/03/21  Yes Ramaswamy, Belva Crome, MD  TYVASO DPI MAINTENANCE KIT 64 MCG POWD Inhale 1 puff into the lungs in the morning, at noon, in the evening,  and at bedtime. 04/14/22  Yes [provider]  Verapamil HCl CR 300 MG CP24 Take 1 capsule (300 mg total) by mouth daily. 03/09/22 09/05/22 Yes End, Harrell Gave, MD  Polyethyl Glycol-Propyl Glycol (SYSTANE) 0.4-0.3 % GEL ophthalmic gel Place 2 application  into both eyes 2 (two) times daily as needed (dry eyes).    [provider]   CT TIBIA FIBULA LEFT W CONTRAST  Result Date: 04/26/2022 CLINICAL DATA:  Thigh, calf, and ankle cellulitis. Worsening redness. Evaluate for underlying deep infection or abscess. EXAM: CT OF THE LOWER LEFT EXTREMITY WITH CONTRAST TECHNIQUE: Multidetector CT imaging of the lower left extremity was performed according to the standard protocol following intravenous contrast administration. RADIATION DOSE REDUCTION: This exam was performed according to the departmental dose-optimization program which includes automated exposure control, adjustment of the mA and/or kV according to patient size and/or use of iterative reconstruction technique. CONTRAST:  92m OMNIPAQUE IOHEXOL 350 MG/ML SOLN COMPARISON:  Left knee radiographs 09/22/2012 FINDINGS: Left femur: Bones/Joint/Cartilage There is diffuse decreased bone mineralization. Moderate joint space narrowing and peripheral osteophytosis of the pubic symphysis. Moderate superior left femoroacetabular joint space narrowing. Mild superior lateral left acetabular degenerative osteophytosis. Within the limitations of diffuse decreased bone mineralization, no acute fracture is seen. No definite cortical erosion is seen. Left sacroiliac joint partially visualized subchondral sclerosis and vacuum phenomenon. Within the limitations of metallic streak artifact from total left knee arthroplasty, no definite perihardware lucency is seen to indicate hardware failure or loosening. Ligaments Suboptimally assessed by CT. Muscles and Tendons There is mild-to-moderate fatty infiltration of the left semimembranosus muscle in the long head of  the rectus femoris muscle with possible minimal muscle atrophy. No gross tendon tear is visualized. Soft tissues There is mild soft tissue swelling and moderate edema/fat stranding seen throughout the left calf, greatest within the anterolateral proximal, mid, and distal left thigh and next greatest within the posteromedial mid to distal left thigh. No walled-off fluid collection is seen. Moderate to  high-grade atherosclerotic calcifications. There is minimal partial visualization of a high-grade sigmoid and descending colon diverticulosis. -- Left tibia and fibula: Bones/Joint/Cartilage Within the limitations of metallic streak artifact, no perihardware lucency is seen around the total left knee arthroplasty hardware to indicate hardware failure or loosening. Additional anterolateral distal tibial plate and screw fixation hardware without definite hardware complication. No acute fracture or cortical erosion is seen. Ligaments Suboptimally assessed by CT. Muscles and Tendons There is diffuse fatty infiltration seen throughout the anterior, lateral, deep posterior, and mid to distal aspect of the superficial posterior muscle compartments of the left calf. Soft tissues There is moderate swelling and edema/stranding seen throughout the left calf subcutaneous fat diffusely. No walled-off abscess is visualized. There are moderate to high-grade atherosclerotic calcifications. -- Left foot: Bones/Joint/Cartilage There is distal anterior tibial plate and screw fixation hardware that is also involves screws traversing the talar neck. 2/3 of the screws are fractured (sagittal series 22 images 47 through 49). There are additional screws traversing the medial cuneiform through the anterior process of the calcaneus and lateral cuneiform through the anterior process of the calcaneus. Old screw tracts from prior remote posterior subtalar arthrodesis screws. There is arthrodesis of the posterior, medial, and anterior subtalar  joints, talonavicular joint, and the calcaneocuboid joint. High-grade joint space narrowing throughout the tarsometatarsal joints and the tibiotalar joint without osseous fusion. Additional screws are visualized within the second through fourth toes, spanning the PIP and DIP joints. No definite cortical erosion. Ligaments Suboptimally assessed by CT. Muscles and Tendons High-grade diffuse muscle atrophy. Soft tissues Mild edema and swelling of the anterior, medial, and lateral ankle and dorsal midfoot subcutaneous fat diffusely. No walled-off fluid collection is seen. IMPRESSION: 1. No definite cortical erosion within the left femur, tibia/fibula, or left foot. 2. Diffuse left lower extremity subcutaneous fat edema and swelling compatible with cellulitis. No walled-off fluid collection is seen to indicate abscess. 3. 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 No evidence of hardware failure outside of remote fracturing of two screws within the distal aspect of the distal tibia-midfoot plate and screw fusion hardware. Electronically Signed   By: [name] M.D.   On: 04/26/2022 15:30   CT FOOT LEFT W CONTRAST  Result Date: 04/26/2022 CLINICAL DATA:  Thigh, calf, and ankle cellulitis. Worsening redness. Evaluate for underlying deep infection or abscess. EXAM: CT OF THE LOWER LEFT EXTREMITY WITH CONTRAST TECHNIQUE: Multidetector CT imaging of the lower left extremity was performed according to the standard protocol following intravenous contrast administration. RADIATION DOSE REDUCTION: This exam was performed according to the departmental dose-optimization program which includes automated exposure control, adjustment of the mA and/or kV according to patient size and/or use of iterative reconstruction technique. CONTRAST:  28m OMNIPAQUE IOHEXOL 350 MG/ML SOLN COMPARISON:  Left knee radiographs 09/22/2012 FINDINGS: Left femur: Bones/Joint/Cartilage There is diffuse  decreased bone mineralization. Moderate joint space narrowing and peripheral osteophytosis of the pubic symphysis. Moderate superior left femoroacetabular joint space narrowing. Mild superior lateral left acetabular degenerative osteophytosis. Within the limitations of diffuse decreased bone mineralization, no acute fracture is seen. No definite cortical erosion is seen. Left sacroiliac joint partially visualized subchondral sclerosis and vacuum phenomenon. Within the limitations of metallic streak artifact from total left knee arthroplasty, no definite perihardware lucency is seen to indicate hardware failure or loosening. Ligaments Suboptimally assessed by CT. Muscles and Tendons There is mild-to-moderate fatty infiltration of the left semimembranosus muscle in the long head of the rectus femoris muscle  with possible minimal muscle atrophy. No gross tendon tear is visualized. Soft tissues There is mild soft tissue swelling and moderate edema/fat stranding seen throughout the left calf, greatest within the anterolateral proximal, mid, and distal left thigh and next greatest within the posteromedial mid to distal left thigh. No walled-off fluid collection is seen. Moderate to high-grade atherosclerotic calcifications. There is minimal partial visualization of a high-grade sigmoid and descending colon diverticulosis. -- Left tibia and fibula: Bones/Joint/Cartilage Within the limitations of metallic streak artifact, no perihardware lucency is seen around the total left knee arthroplasty hardware to indicate hardware failure or loosening. Additional anterolateral distal tibial plate and screw fixation hardware without definite hardware complication. No acute fracture or cortical erosion is seen. Ligaments Suboptimally assessed by CT. Muscles and Tendons There is diffuse fatty infiltration seen throughout the anterior, lateral, deep posterior, and mid to distal aspect of the superficial posterior muscle compartments of  the left calf. Soft tissues There is moderate swelling and edema/stranding seen throughout the left calf subcutaneous fat diffusely. No walled-off abscess is visualized. There are moderate to high-grade atherosclerotic calcifications. -- Left foot: Bones/Joint/Cartilage There is distal anterior tibial plate and screw fixation hardware that is also involves screws traversing the talar neck. 2/3 of the screws are fractured (sagittal series 22 images 47 through 49). There are additional screws traversing the medial cuneiform through the anterior process of the calcaneus and lateral cuneiform through the anterior process of the calcaneus. Old screw tracts from prior remote posterior subtalar arthrodesis screws. There is arthrodesis of the posterior, medial, and anterior subtalar joints, talonavicular joint, and the calcaneocuboid joint. High-grade joint space narrowing throughout the tarsometatarsal joints and the tibiotalar joint without osseous fusion. Additional screws are visualized within the second through fourth toes, spanning the PIP and DIP joints. No definite cortical erosion. Ligaments Suboptimally assessed by CT. Muscles and Tendons High-grade diffuse muscle atrophy. Soft tissues Mild edema and swelling of the anterior, medial, and lateral ankle and dorsal midfoot subcutaneous fat diffusely. No walled-off fluid collection is seen. IMPRESSION: 1. No definite cortical erosion within the left femur, tibia/fibula, or left foot. 2. Diffuse left lower extremity subcutaneous fat edema and swelling compatible with cellulitis. No walled-off fluid collection is seen to indicate abscess. 3. 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 No evidence of hardware failure outside of remote fracturing of two screws within the distal aspect of the distal tibia-midfoot plate and screw fusion hardware. Electronically Signed   By: [name] M.D.   On: 04/26/2022 15:30   CT FEMUR LEFT W  CONTRAST  Result Date: 04/26/2022 CLINICAL DATA:  Thigh, calf, and ankle cellulitis. Worsening redness. Evaluate for underlying deep infection or abscess. EXAM: CT OF THE LOWER LEFT EXTREMITY WITH CONTRAST TECHNIQUE: Multidetector CT imaging of the lower left extremity was performed according to the standard protocol following intravenous contrast administration. RADIATION DOSE REDUCTION: This exam was performed according to the departmental dose-optimization program which includes automated exposure control, adjustment of the mA and/or kV according to patient size and/or use of iterative reconstruction technique. CONTRAST:  56m OMNIPAQUE IOHEXOL 350 MG/ML SOLN COMPARISON:  Left knee radiographs 09/22/2012 FINDINGS: Left femur: Bones/Joint/Cartilage There is diffuse decreased bone mineralization. Moderate joint space narrowing and peripheral osteophytosis of the pubic symphysis. Moderate superior left femoroacetabular joint space narrowing. Mild superior lateral left acetabular degenerative osteophytosis. Within the limitations of diffuse decreased bone mineralization, no acute fracture is seen. No definite cortical erosion is seen. Left sacroiliac joint partially  visualized subchondral sclerosis and vacuum phenomenon. Within the limitations of metallic streak artifact from total left knee arthroplasty, no definite perihardware lucency is seen to indicate hardware failure or loosening. Ligaments Suboptimally assessed by CT. Muscles and Tendons There is mild-to-moderate fatty infiltration of the left semimembranosus muscle in the long head of the rectus femoris muscle with possible minimal muscle atrophy. No gross tendon tear is visualized. Soft tissues There is mild soft tissue swelling and moderate edema/fat stranding seen throughout the left calf, greatest within the anterolateral proximal, mid, and distal left thigh and next greatest within the posteromedial mid to distal left thigh. No walled-off fluid  collection is seen. Moderate to high-grade atherosclerotic calcifications. There is minimal partial visualization of a high-grade sigmoid and descending colon diverticulosis. -- Left tibia and fibula: Bones/Joint/Cartilage Within the limitations of metallic streak artifact, no perihardware lucency is seen around the total left knee arthroplasty hardware to indicate hardware failure or loosening. Additional anterolateral distal tibial plate and screw fixation hardware without definite hardware complication. No acute fracture or cortical erosion is seen. Ligaments Suboptimally assessed by CT. Muscles and Tendons There is diffuse fatty infiltration seen throughout the anterior, lateral, deep posterior, and mid to distal aspect of the superficial posterior muscle compartments of the left calf. Soft tissues There is moderate swelling and edema/stranding seen throughout the left calf subcutaneous fat diffusely. No walled-off abscess is visualized. There are moderate to high-grade atherosclerotic calcifications. -- Left foot: Bones/Joint/Cartilage There is distal anterior tibial plate and screw fixation hardware that is also involves screws traversing the talar neck. 2/3 of the screws are fractured (sagittal series 22 images 47 through 49). There are additional screws traversing the medial cuneiform through the anterior process of the calcaneus and lateral cuneiform through the anterior process of the calcaneus. Old screw tracts from prior remote posterior subtalar arthrodesis screws. There is arthrodesis of the posterior, medial, and anterior subtalar joints, talonavicular joint, and the calcaneocuboid joint. High-grade joint space narrowing throughout the tarsometatarsal joints and the tibiotalar joint without osseous fusion. Additional screws are visualized within the second through fourth toes, spanning the PIP and DIP joints. No definite cortical erosion. Ligaments Suboptimally assessed by CT. Muscles and Tendons  High-grade diffuse muscle atrophy. Soft tissues Mild edema and swelling of the anterior, medial, and lateral ankle and dorsal midfoot subcutaneous fat diffusely. No walled-off fluid collection is seen. IMPRESSION: 1. No definite cortical erosion within the left femur, tibia/fibula, or left foot. 2. Diffuse left lower extremity subcutaneous fat edema and swelling compatible with cellulitis. No walled-off fluid collection is seen to indicate abscess. 3. 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 No evidence of hardware failure outside of remote fracturing of two screws within the distal aspect of the distal tibia-midfoot plate and screw fusion hardware. Electronically Signed   By: [name] M.D.   On: 04/26/2022 15:30   - pertinent xrays, CT, MRI studies were reviewed and independently interpreted  Positive ROS: All other systems have been reviewed and were otherwise negative with the exception of those mentioned in the HPI and as above.  Physical Exam: General: Alert, no acute distress Psychiatric: Patient is competent for consent with normal mood and affect Lymphatic: No axillary or cervical lymphadenopathy Cardiovascular: No pedal edema Respiratory: No cyanosis, no use of accessory musculature GI: No organomegaly, abdomen is soft and non-tender    Images:  _0 @  Labs:  Lab Results  Component Value Date   HGBA1C 6.0 (H) 11/15/2012  ESRSEDRATE 2 04/21/2022   ESRSEDRATE 7 04/27/2021   ESRSEDRATE 2 05/09/2018   CRP 0.5 04/21/2022   CRP 1.4 05/09/2018   CRP 19.8 (H) 11/15/2012   REPTSTATUS 08/03/2013 FINAL 08/02/2013   GRAMSTAIN Abundant 09/07/2016   GRAMSTAIN WBC present-both PMN and Mononuclear 09/07/2016   GRAMSTAIN Moderate Squamous Epithelial Cells Present 09/07/2016   GRAMSTAIN Abundant Gram Positive Cocci In Pairs In Clusters 09/07/2016   GRAMSTAIN Moderate Gram Negative Rods 09/07/2016   CULT  08/01/2013    FEW CANDIDA  ALBICANS Performed at [facility]   LABORGA Normal Oropharyngeal Flora 09/07/2016    Lab Results  Component Value Date   ALBUMIN 3.8 04/20/2022   ALBUMIN 4.1 12/09/2021   ALBUMIN 4.2 09/16/2021   PREALBUMIN 11.2 (L) 08/01/2013        Latest Ref Rng & Units 04/26/2022    2:41 AM 04/25/2022    3:25 AM 04/24/2022    1:07 AM  CBC EXTENDED  WBC 4.0 - 10.5 K/uL 4.8  6.9  8.9   RBC 3.87 - 5.11 MIL/uL 3.62  3.51  3.53   Hemoglobin 12.0 - 15.0 g/dL 11.3  11.0  11.3   HCT 36.0 - 46.0 % 36.2  35.5  35.1   Platelets 150 - 400 K/uL 142  123  116     Neurologic: Patient does not have protective sensation bilateral lower extremities.   MUSCULOSKELETAL:   Skin: Examination patient has cellulitis and dermatitis involving the left leg.  There is no ascending cellulitis past the knee.  The swelling has decreased significantly and patient has good wrinkling of the skin.  There is some fluctuant areas of serosanguineous fluid from the decrease swelling.  Patient also has some induration proximally in the posterior aspect of the thigh however there is also no cellulitis.  There is no pain with dorsiflexion of the ankle.  Review of the venous studies shows no evidence of a DVT.  Patient has a palpable pulse clinically no signs of arterial insufficiency.  Review of the CT scan shows the cellulitis but is does not show any areas of abscess fluid collection in the thigh or leg.  Patient's white cell count is 4.8 with a hemoglobin of 11.3.  Assessment: Assessment: Cellulitis and dermatitis left leg with no evidence of abscess collection.  Plan: Plan: Would continue with the IV antibiotics.  I do not see any drainable fluid collections that could be treated surgically.  Patient states that she required over 9 months to heal a wound on her right lower extremity previously.  Thank you for the consult and the opportunity to see Ms. Carl Best, MD [place] [phone] 12:34 PM

## 2022-04-27 NOTE — Progress Notes (Signed)
PT Cancellation Note  Patient Details Name: [name] MRN: [id number] DOB: August 02, 1945   Cancelled Treatment:    Reason Eval/Treat Not Completed: Patient declined, no reason specified (pt stating increased LE pain despite pre-medication, education and encouragement.) Will check back tomorrow to continue PT POC.   Audry Riles. PTA Acute Rehabilitation Services Office: [place] 04/27/2022, 3:27 PM

## 2022-04-27 NOTE — Progress Notes (Signed)
OT Cancellation Note  Patient Details Name: [name] MRN: [id number] DOB: January 27, 1946   Cancelled Treatment:    Reason Eval/Treat Not Completed: Patient declined, no reason specified (pt reporting she is in pain, requesting therapy come back tomorrow. Will follow.)  Lynnda Child, OTD, OTR/L Acute Rehab [phone]) [phone] - [place] 04/27/2022, 2:07 PM

## 2022-04-28 DIAGNOSIS — L03116 Cellulitis of left lower limb: Secondary | ICD-10-CM | POA: Diagnosis not present

## 2022-04-28 LAB — BASIC METABOLIC PANEL
Anion gap: 8 (ref 5–15)
BUN: 14 mg/dL (ref 8–23)
CO2: 33 mmol/L — ABNORMAL HIGH (ref 22–32)
Calcium: 9.6 mg/dL (ref 8.9–10.3)
Chloride: 97 mmol/L — ABNORMAL LOW (ref 98–111)
Creatinine, Ser: 0.95 mg/dL (ref 0.44–1.00)
GFR, Estimated: >60 mL/min (ref >60)
Glucose, Bld: 152 mg/dL — ABNORMAL HIGH (ref 70–99)
Potassium: 2.8 mmol/L — ABNORMAL LOW (ref 3.5–5.1)
Sodium: 138 mmol/L (ref 135–145)

## 2022-04-28 LAB — CBC
HCT: 32.1 % — ABNORMAL LOW (ref 36.0–46.0)
Hemoglobin: 10.3 g/dL — ABNORMAL LOW (ref 12.0–15.0)
MCH: 31.9 pg (ref 26.0–34.0)
MCHC: 32.1 g/dL (ref 30.0–36.0)
MCV: 99.4 fL (ref 80.0–100.0)
Platelets: 184 10*3/uL (ref 150–400)
RBC: 3.23 MIL/uL — ABNORMAL LOW (ref 3.87–5.11)
RDW: 13.6 % (ref 11.5–15.5)
WBC: 5.5 10*3/uL (ref 4.0–10.5)
nRBC: 0 % (ref 0.0–0.2)

## 2022-04-28 MED ORDER — HYDROCORTISONE 1 % EX CREA
TOPICAL_CREAM | Freq: Two times a day (BID) | CUTANEOUS | Status: DC
  Filled 2022-04-28: qty 28

## 2022-04-28 MED ORDER — POTASSIUM CHLORIDE CRYS ER 20 MEQ PO TBCR
40.0000 meq | EXTENDED_RELEASE_TABLET | Freq: Once | ORAL | Status: AC
  Administered 2022-04-28: 40 meq via ORAL
  Filled 2022-04-28: qty 2

## 2022-04-28 MED ORDER — LOPERAMIDE HCL 2 MG PO CAPS
2.0000 mg | ORAL_CAPSULE | Freq: Two times a day (BID) | ORAL | Status: DC | PRN
  Administered 2022-04-28 – 2022-04-29 (×2): 2 mg via ORAL
  Filled 2022-04-28 (×2): qty 1

## 2022-04-28 MED ORDER — FUROSEMIDE 10 MG/ML IJ SOLN
60.0000 mg | Freq: Once | INTRAMUSCULAR | Status: AC
  Administered 2022-04-28: 60 mg via INTRAVENOUS
  Filled 2022-04-28: qty 6

## 2022-04-28 MED ORDER — POTASSIUM CHLORIDE CRYS ER 20 MEQ PO TBCR
40.0000 meq | EXTENDED_RELEASE_TABLET | ORAL | Status: AC
  Administered 2022-04-28 (×3): 40 meq via ORAL
  Filled 2022-04-28 (×3): qty 2

## 2022-04-28 NOTE — Progress Notes (Addendum)
[facility] Infectious Diseases Follow Up Note  Patient Identification: Patient Name: [name] MRN: [id number] [place] Date: 04/20/2022  6:00 PM Age: 76 y.o.Today's Date: 04/28/2022  Reason for Visit: LLE cellulitis   Principal Problem:   Cellulitis of left lower extremity Active Problems:   ANXIETY DEPRESSION   Rheumatoid arthritis ([place])   ILD (interstitial lung disease) ([place])   Obstructive sleep apnea   Pulmonary hypertension ([facility])   Hyperlipidemia LDL goal <70   Obese   Acute on chronic diastolic CHF (congestive heart failure) ([facility])   Hypokalemia  Antibiotics:  Ceftriaxone 10/5-10/9, Unasyn 10/10-c  Fluconazole 10/5 Vancomycin 10/9-c   Lines/Hardware: Right THA and bilateral knee arthroplasty, left ankle hardware  Interval Events: Remains afebrile, no leukocytosis.  Evaluated by orthopedics with no plan for intervention   Assessment 76 year old female with a PMH of asthma, anxiety, hypertension, GERD, ILD with oxygen dependence 7 L, pulmonary hypertension, RA on chronic prednisoneand leflunomide,  sleep apnea, IBS, CHFpEF,  right THA and bilateral knee arthroplasty, s/p 6 surgeries in the left ankle with hardware admitted with LLE cellulitis. ID engaged for worsening erythema on IV abtx.   10/10 CK 32  CT left foot, left tibia-fibula and left femur on 10/10 noted Evaluated by orthopedics on 10/11 with improved findings and no need for operative intervention.  Clinically improving with improving redness.  No concern of involvement of left ankle as well as left Prosthetic knee at this time clinically as well as per CT   Loose stools- likely abtx induced diarrhea, monitor  Recommendations Continue vancomycin,, pharmacy to dose and Unasyn as is Expect 1-2 more days of IV antibiotics given intensity of redness and then plan to transition to p.o. doxycycline and Augmentin on discharge to complete 14 days of therapy Keep  legs elevated Electrolyte management per primary  Following  Rest of the management as per the primary team. Thank you for the consult. Please page with pertinent questions or concerns.  ______________________________________________________________________ Subjective patient seen and examined at the bedside.  Thinks redness in her left leg is improving.  Denies any pain or tenderness in her left ankle or left knee joint  Has some loose stool, no fevers, abdominal cramps , likely abtx induced diarrhea  Vitals BP (!) 154/68 (BP Location: Right Arm)   Pulse 89   Temp 98.2 F (36.8 C) (Oral)   Resp 16   Ht 5' (1.524 m)   Wt 88.4 kg   SpO2 97%   BMI 38.06 kg/m     Physical Exam Constitutional: Sitting up in the bed and appears comfortable    Comments:   Cardiovascular:     Rate and Rhythm: Normal rate and regular rhythm.     Heart sounds:   Pulmonary:     Effort: Pulmonary effort is normal on 7L [place]    Comments:   Abdominal:     Palpations: Abdomen is soft.     Tenderness: Nondistended and nontender  Musculoskeletal:        General: Left hip with no swelling and tenderness with good range of motion.                    Left knee with no swelling and tenderness with good range of motion                   Left ankle is fused.  Swelling, no warmth and tenderness to suggest septic joint  Left leg with improving erythema, mild tenderness, no warmth.  Early bullae in the left anterolateral leg.  No crepitus or fluctuance.  Skin is drinking suggestive of improved edema     Skin:    Comments: as above  Neurological:     General: Grossly nonfocal, awake alert and oriented  Psychiatric:        Mood and Affect: Mood normal.   Pertinent Microbiology Results for orders placed or performed during the hospital encounter of 11/06/20  SARS CORONAVIRUS 2 (TAT 6-24 HRS) Nasopharyngeal Nasopharyngeal Swab     Status: None   Collection Time: 11/06/20 11:22 AM    Specimen: Nasopharyngeal Swab  Result Value Ref Range Status   SARS Coronavirus 2 NEGATIVE NEGATIVE Final    Comment: (NOTE) SARS-CoV-2 target nucleic acids are NOT DETECTED.  The SARS-CoV-2 RNA is generally detectable in upper and lower respiratory specimens during the acute phase of infection. Negative results do not preclude SARS-CoV-2 infection, do not rule out co-infections with other pathogens, and should not be used as the sole basis for treatment or other patient management decisions. Negative results must be combined with clinical observations, patient history, and epidemiological information. The expected result is Negative.  Fact Sheet for Patients: SugarRoll.be  Fact Sheet for Healthcare Providers: https://www.woods-mathews.com/  This test is not yet approved or cleared by the [place] FDA and  has been authorized for detection and/or diagnosis of SARS-CoV-2 by FDA under an Emergency Use Authorization (EUA). This EUA will remain  in effect (meaning this test can be used) for the duration of the COVID-19 declaration under Se ction 564(b)(1) of the Act, 21 U.S.C. section 360bbb-3(b)(1), unless the authorization is terminated or revoked sooner.  Performed at [facility], [place] [address], [place], [place] [address]      Pertinent Lab.    Latest Ref Rng & Units 04/28/2022   12:36 AM 04/26/2022    2:41 AM 04/25/2022    3:25 AM  CBC  WBC 4.0 - 10.5 K/uL 5.5  4.8  6.9   Hemoglobin 12.0 - 15.0 g/dL 10.3  11.3  11.0   Hematocrit 36.0 - 46.0 % 32.1  36.2  35.5   Platelets 150 - 400 K/uL 184  142  123       Latest Ref Rng & Units 04/28/2022   12:36 AM 04/27/2022    9:28 AM 04/25/2022    3:25 AM  CMP  Glucose 70 - 99 mg/dL 152  159  140   BUN 8 - 23 mg/dL _0 Creatinine 0.44 - 1.00 mg/dL 0.95  0.76  0.91   Sodium 135 - 145 mmol/L 138  140  139   Potassium 3.5 - 5.1 mmol/L 2.8  3.2  4.4   Chloride 98 -  111 mmol/L 97  98  100   CO2 22 - 32 mmol/L 33  31  36   Calcium 8.9 - 10.3 mg/dL 9.6  10.6  9.8      Pertinent Imaging today Plain films and CT images have been personally visualized and interpreted; radiology reports have been reviewed. Decision making incorporated into the Impression / Recommendations.  CT TIBIA FIBULA LEFT W CONTRAST  Result Date: 04/26/2022 CLINICAL DATA:  Thigh, calf, and ankle cellulitis. Worsening redness. Evaluate for underlying deep infection or abscess. EXAM: CT OF THE LOWER LEFT EXTREMITY WITH CONTRAST TECHNIQUE: Multidetector CT imaging of the lower left extremity was performed according to the standard protocol following intravenous  contrast administration. RADIATION DOSE REDUCTION: This exam was performed according to the departmental dose-optimization program which includes automated exposure control, adjustment of the mA and/or kV according to patient size and/or use of iterative reconstruction technique. CONTRAST:  14m OMNIPAQUE IOHEXOL 350 MG/ML SOLN COMPARISON:  Left knee radiographs 09/22/2012 FINDINGS: Left femur: Bones/Joint/Cartilage There is diffuse decreased bone mineralization. Moderate joint space narrowing and peripheral osteophytosis of the pubic symphysis. Moderate superior left femoroacetabular joint space narrowing. Mild superior lateral left acetabular degenerative osteophytosis. Within the limitations of diffuse decreased bone mineralization, no acute fracture is seen. No definite cortical erosion is seen. Left sacroiliac joint partially visualized subchondral sclerosis and vacuum phenomenon. Within the limitations of metallic streak artifact from total left knee arthroplasty, no definite perihardware lucency is seen to indicate hardware failure or loosening. Ligaments Suboptimally assessed by CT. Muscles and Tendons There is mild-to-moderate fatty infiltration of the left semimembranosus muscle in the long head of the rectus femoris muscle with  possible minimal muscle atrophy. No gross tendon tear is visualized. Soft tissues There is mild soft tissue swelling and moderate edema/fat stranding seen throughout the left calf, greatest within the anterolateral proximal, mid, and distal left thigh and next greatest within the posteromedial mid to distal left thigh. No walled-off fluid collection is seen. Moderate to high-grade atherosclerotic calcifications. There is minimal partial visualization of a high-grade sigmoid and descending colon diverticulosis. -- Left tibia and fibula: Bones/Joint/Cartilage Within the limitations of metallic streak artifact, no perihardware lucency is seen around the total left knee arthroplasty hardware to indicate hardware failure or loosening. Additional anterolateral distal tibial plate and screw fixation hardware without definite hardware complication. No acute fracture or cortical erosion is seen. Ligaments Suboptimally assessed by CT. Muscles and Tendons There is diffuse fatty infiltration seen throughout the anterior, lateral, deep posterior, and mid to distal aspect of the superficial posterior muscle compartments of the left calf. Soft tissues There is moderate swelling and edema/stranding seen throughout the left calf subcutaneous fat diffusely. No walled-off abscess is visualized. There are moderate to high-grade atherosclerotic calcifications. -- Left foot: Bones/Joint/Cartilage There is distal anterior tibial plate and screw fixation hardware that is also involves screws traversing the talar neck. 2/3 of the screws are fractured (sagittal series 22 images 47 through 49). There are additional screws traversing the medial cuneiform through the anterior process of the calcaneus and lateral cuneiform through the anterior process of the calcaneus. Old screw tracts from prior remote posterior subtalar arthrodesis screws. There is arthrodesis of the posterior, medial, and anterior subtalar joints, talonavicular joint, and the  calcaneocuboid joint. High-grade joint space narrowing throughout the tarsometatarsal joints and the tibiotalar joint without osseous fusion. Additional screws are visualized within the second through fourth toes, spanning the PIP and DIP joints. No definite cortical erosion. Ligaments Suboptimally assessed by CT. Muscles and Tendons High-grade diffuse muscle atrophy. Soft tissues Mild edema and swelling of the anterior, medial, and lateral ankle and dorsal midfoot subcutaneous fat diffusely. No walled-off fluid collection is seen. IMPRESSION: 1. No definite cortical erosion within the left femur, tibia/fibula, or left foot. 2. Diffuse left lower extremity subcutaneous fat edema and swelling compatible with cellulitis. No walled-off fluid collection is seen to indicate abscess. 3. 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 No evidence of hardware failure outside of remote fracturing of two screws within the distal aspect of the distal tibia-midfoot plate and screw fusion hardware. Electronically Signed   By: RYvonne KendallM.D.   On:  04/26/2022 15:30   CT FOOT LEFT W CONTRAST  Result Date: 04/26/2022 CLINICAL DATA:  Thigh, calf, and ankle cellulitis. Worsening redness. Evaluate for underlying deep infection or abscess. EXAM: CT OF THE LOWER LEFT EXTREMITY WITH CONTRAST TECHNIQUE: Multidetector CT imaging of the lower left extremity was performed according to the standard protocol following intravenous contrast administration. RADIATION DOSE REDUCTION: This exam was performed according to the departmental dose-optimization program which includes automated exposure control, adjustment of the mA and/or kV according to patient size and/or use of iterative reconstruction technique. CONTRAST:  70m OMNIPAQUE IOHEXOL 350 MG/ML SOLN COMPARISON:  Left knee radiographs 09/22/2012 FINDINGS: Left femur: Bones/Joint/Cartilage There is diffuse decreased bone mineralization. Moderate joint  space narrowing and peripheral osteophytosis of the pubic symphysis. Moderate superior left femoroacetabular joint space narrowing. Mild superior lateral left acetabular degenerative osteophytosis. Within the limitations of diffuse decreased bone mineralization, no acute fracture is seen. No definite cortical erosion is seen. Left sacroiliac joint partially visualized subchondral sclerosis and vacuum phenomenon. Within the limitations of metallic streak artifact from total left knee arthroplasty, no definite perihardware lucency is seen to indicate hardware failure or loosening. Ligaments Suboptimally assessed by CT. Muscles and Tendons There is mild-to-moderate fatty infiltration of the left semimembranosus muscle in the long head of the rectus femoris muscle with possible minimal muscle atrophy. No gross tendon tear is visualized. Soft tissues There is mild soft tissue swelling and moderate edema/fat stranding seen throughout the left calf, greatest within the anterolateral proximal, mid, and distal left thigh and next greatest within the posteromedial mid to distal left thigh. No walled-off fluid collection is seen. Moderate to high-grade atherosclerotic calcifications. There is minimal partial visualization of a high-grade sigmoid and descending colon diverticulosis. -- Left tibia and fibula: Bones/Joint/Cartilage Within the limitations of metallic streak artifact, no perihardware lucency is seen around the total left knee arthroplasty hardware to indicate hardware failure or loosening. Additional anterolateral distal tibial plate and screw fixation hardware without definite hardware complication. No acute fracture or cortical erosion is seen. Ligaments Suboptimally assessed by CT. Muscles and Tendons There is diffuse fatty infiltration seen throughout the anterior, lateral, deep posterior, and mid to distal aspect of the superficial posterior muscle compartments of the left calf. Soft tissues There is moderate  swelling and edema/stranding seen throughout the left calf subcutaneous fat diffusely. No walled-off abscess is visualized. There are moderate to high-grade atherosclerotic calcifications. -- Left foot: Bones/Joint/Cartilage There is distal anterior tibial plate and screw fixation hardware that is also involves screws traversing the talar neck. 2/3 of the screws are fractured (sagittal series 22 images 47 through 49). There are additional screws traversing the medial cuneiform through the anterior process of the calcaneus and lateral cuneiform through the anterior process of the calcaneus. Old screw tracts from prior remote posterior subtalar arthrodesis screws. There is arthrodesis of the posterior, medial, and anterior subtalar joints, talonavicular joint, and the calcaneocuboid joint. High-grade joint space narrowing throughout the tarsometatarsal joints and the tibiotalar joint without osseous fusion. Additional screws are visualized within the second through fourth toes, spanning the PIP and DIP joints. No definite cortical erosion. Ligaments Suboptimally assessed by CT. Muscles and Tendons High-grade diffuse muscle atrophy. Soft tissues Mild edema and swelling of the anterior, medial, and lateral ankle and dorsal midfoot subcutaneous fat diffusely. No walled-off fluid collection is seen. IMPRESSION: 1. No definite cortical erosion within the left femur, tibia/fibula, or left foot. 2. Diffuse left lower extremity subcutaneous fat edema and swelling compatible with cellulitis. No  walled-off fluid collection is seen to indicate abscess. 3. 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 No evidence of hardware failure outside of remote fracturing of two screws within the distal aspect of the distal tibia-midfoot plate and screw fusion hardware. Electronically Signed   By: [name] M.D.   On: 04/26/2022 15:30   CT FEMUR LEFT W CONTRAST  Result Date: 04/26/2022 CLINICAL  DATA:  Thigh, calf, and ankle cellulitis. Worsening redness. Evaluate for underlying deep infection or abscess. EXAM: CT OF THE LOWER LEFT EXTREMITY WITH CONTRAST TECHNIQUE: Multidetector CT imaging of the lower left extremity was performed according to the standard protocol following intravenous contrast administration. RADIATION DOSE REDUCTION: This exam was performed according to the departmental dose-optimization program which includes automated exposure control, adjustment of the mA and/or kV according to patient size and/or use of iterative reconstruction technique. CONTRAST:  88m OMNIPAQUE IOHEXOL 350 MG/ML SOLN COMPARISON:  Left knee radiographs 09/22/2012 FINDINGS: Left femur: Bones/Joint/Cartilage There is diffuse decreased bone mineralization. Moderate joint space narrowing and peripheral osteophytosis of the pubic symphysis. Moderate superior left femoroacetabular joint space narrowing. Mild superior lateral left acetabular degenerative osteophytosis. Within the limitations of diffuse decreased bone mineralization, no acute fracture is seen. No definite cortical erosion is seen. Left sacroiliac joint partially visualized subchondral sclerosis and vacuum phenomenon. Within the limitations of metallic streak artifact from total left knee arthroplasty, no definite perihardware lucency is seen to indicate hardware failure or loosening. Ligaments Suboptimally assessed by CT. Muscles and Tendons There is mild-to-moderate fatty infiltration of the left semimembranosus muscle in the long head of the rectus femoris muscle with possible minimal muscle atrophy. No gross tendon tear is visualized. Soft tissues There is mild soft tissue swelling and moderate edema/fat stranding seen throughout the left calf, greatest within the anterolateral proximal, mid, and distal left thigh and next greatest within the posteromedial mid to distal left thigh. No walled-off fluid collection is seen. Moderate to high-grade  atherosclerotic calcifications. There is minimal partial visualization of a high-grade sigmoid and descending colon diverticulosis. -- Left tibia and fibula: Bones/Joint/Cartilage Within the limitations of metallic streak artifact, no perihardware lucency is seen around the total left knee arthroplasty hardware to indicate hardware failure or loosening. Additional anterolateral distal tibial plate and screw fixation hardware without definite hardware complication. No acute fracture or cortical erosion is seen. Ligaments Suboptimally assessed by CT. Muscles and Tendons There is diffuse fatty infiltration seen throughout the anterior, lateral, deep posterior, and mid to distal aspect of the superficial posterior muscle compartments of the left calf. Soft tissues There is moderate swelling and edema/stranding seen throughout the left calf subcutaneous fat diffusely. No walled-off abscess is visualized. There are moderate to high-grade atherosclerotic calcifications. -- Left foot: Bones/Joint/Cartilage There is distal anterior tibial plate and screw fixation hardware that is also involves screws traversing the talar neck. 2/3 of the screws are fractured (sagittal series 22 images 47 through 49). There are additional screws traversing the medial cuneiform through the anterior process of the calcaneus and lateral cuneiform through the anterior process of the calcaneus. Old screw tracts from prior remote posterior subtalar arthrodesis screws. There is arthrodesis of the posterior, medial, and anterior subtalar joints, talonavicular joint, and the calcaneocuboid joint. High-grade joint space narrowing throughout the tarsometatarsal joints and the tibiotalar joint without osseous fusion. Additional screws are visualized within the second through fourth toes, spanning the PIP and DIP joints. No definite cortical erosion. Ligaments Suboptimally assessed by CT. Muscles and Tendons  High-grade diffuse muscle atrophy. Soft tissues  Mild edema and swelling of the anterior, medial, and lateral ankle and dorsal midfoot subcutaneous fat diffusely. No walled-off fluid collection is seen. IMPRESSION: 1. No definite cortical erosion within the left femur, tibia/fibula, or left foot. 2. Diffuse left lower extremity subcutaneous fat edema and swelling compatible with cellulitis. No walled-off fluid collection is seen to indicate abscess. 3. 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 No evidence of hardware failure outside of remote fracturing of two screws within the distal aspect of the distal tibia-midfoot plate and screw fusion hardware. Electronically Signed   By: [name] M.D.   On: 04/26/2022 15:30    I spent 55 minutes for this patient encounter including review of prior medical records, coordination of care with primary/other specialist with greater than 50% of time being face to face/counseling and discussing diagnostics/treatment plan with the patient/family.  Electronically signed by:   Rosiland Oz, MD Infectious Disease Physician [facility] Pager: [phone]

## 2022-04-28 NOTE — Progress Notes (Addendum)
Occupational Therapy Treatment Patient Details Name: [name] MRN: [id number] DOB: [birth year] Today's Date: 04/28/2022   History of present illness Patient is a 76 yo female presenting to the [facility] with BLE swelling and SOB on 04/20/22. Admitted with LLE cellulitis and decompensated heart failure on 04/23/22.  PMH includes: HTN, HFpEF, asthmatic bronchitis, PAH, ILD, chronic respiratory failure on 7 L of oxygen, rheumatoid arthritis on chronic immunosuppression, and OSA   OT comments  Patient completed supine to sitting EOB with min guard.  Patient encouraged to completed UB grooming task at aink level but patient refusing 2/2 reports that she doesn't normally stand that long so patient completed UB grooming/hygiene seated EOB with Supervision s/p setup of ADL task.  Patient also completed UB bathing with SBA and min A for doning clean hospital gown.  LB bathing completed with mod A to bathe below knees and posterior peri hygiene.  Mod A for donning clean brief.  Patient transferred from ebd to chair with min guard using RW and verbal cues for overall safety for hand placement. All needs within reach and chair alarm on.   Recommendations for follow up therapy are one component of a multi-disciplinary discharge planning process, led by the attending physician.  Recommendations may be updated based on patient status, additional functional criteria and insurance authorization.    Follow Up Recommendations  Home health OT    Assistance Recommended at Discharge Intermittent Supervision/Assistance  Patient can return home with the following  A little help with walking and/or transfers;A little help with bathing/dressing/bathroom;Assistance with cooking/housework;Direct supervision/assist for medications management;Direct supervision/assist for financial management;Assist for transportation;Help with stairs or ramp for entrance   Equipment Recommendations  None recommended by OT     Recommendations for Other Services      Precautions / Restrictions Precautions Precautions: Fall Precaution Comments: LLE cellulitis Restrictions Weight Bearing Restrictions: No       Mobility Bed Mobility Overal bed mobility: Needs Assistance Bed Mobility: Supine to Sit     Supine to sit: Min guard          Transfers Overall transfer level: Needs assistance Equipment used: Rolling walker (2 wheels) Transfers: Sit to/from Stand Sit to Stand: Min guard           General transfer comment: Min guard for safety     Balance                                           ADL either performed or assessed with clinical judgement   ADL Overall ADL's : Needs assistance/impaired Eating/Feeding: Set up;Sitting   Grooming: Set up;Sitting Grooming Details (indicate cue type and reason):  (patient encouraged to stand at sink to complete but patient refusing 2/2 patient stating " I dont normally stand very long") Upper Body Bathing: Set up;Sitting   Lower Body Bathing: Moderate assistance;Sitting/lateral leans   Upper Body Dressing : Minimal assistance;Sitting   Lower Body Dressing: Moderate assistance;Sitting/lateral leans               Functional mobility during ADLs: Minimal assistance;Cueing for safety;Cueing for sequencing;Rolling walker (2 wheels)      Extremity/Trunk Assessment Upper Extremity Assessment Upper Extremity Assessment: Generalized weakness   Lower Extremity Assessment LLE Deficits / Details: hx of L foot surgery. Increased redness at thigh        Vision  Perception     Praxis      Cognition Arousal/Alertness: Awake/alert Behavior During Therapy: WFL for tasks assessed/performed                                            Exercises      Shoulder Instructions       General Comments      Pertinent Vitals/ Pain       Pain Assessment Pain Assessment: 0-10 Pain Score: 3  Pain  Location: LLE Pain Descriptors / Indicators: Grimacing, Guarding  Home Living                                          Prior Functioning/Environment              Frequency  Min 2X/week        Progress Toward Goals  OT Goals(current goals can now be found in the care plan section)  Progress towards OT goals: Progressing toward goals  Acute Rehab OT Goals Time For Goal Achievement: 05/09/22 Potential to Achieve Goals: Good ADL Goals Pt Will Transfer to Toilet: with supervision;ambulating Pt Will Perform Toileting - Clothing Manipulation and hygiene: with supervision;sitting/lateral leans;sit to/from stand Pt/caregiver will Perform Home Exercise Program: With Supervision;With written HEP provided;Increased strength;Both right and left upper extremity Additional ADL Goal #1: Patient will demonstrate increased activity tolerance to complete functional task in standing for 3 minutes prior to needing seated rest break. Additional ADL Goal #2: Patient will be able to demonstrate pursed lip breathing in order to promote increased activity tolerance and decrease anxiety with movement independently.  Plan Discharge plan remains appropriate    Co-evaluation                 AM-PAC OT "6 Clicks" Daily Activity     Outcome Measure   Help from another person eating meals?: None Help from another person taking care of personal grooming?: A Little Help from another person toileting, which includes using toliet, bedpan, or urinal?: A Little Help from another person bathing (including washing, rinsing, drying)?: A Lot Help from another person to put on and taking off regular upper body clothing?: A Little Help from another person to put on and taking off regular lower body clothing?: A Lot 6 Click Score: 17    End of Session Equipment Utilized During Treatment: Gait belt;Rolling walker (2 wheels);Oxygen  OT Visit Diagnosis: Unsteadiness on feet (R26.81);Muscle  weakness (generalized) (M62.81);History of falling (Z91.81);Pain   Activity Tolerance Patient tolerated treatment well   Patient Left in chair;with call bell/phone within reach;with family/visitor present   Nurse Communication  Patient's mobility status         Time: 5277-8242 OT Time Calculation (min): 64 min  Charges: OT Treatments $Self Care/Home Management : 53-67 mins  Mervyn Skeeters OTR/L [place] 04/28/2022, 2:00 PM

## 2022-04-28 NOTE — Progress Notes (Addendum)
PROGRESS NOTE    [name]  WNI:[id number] DOB: Aug 30, 1945 DOA: 04/20/2022 PCP: Lorene Dy, MD  76/F with history of chronic hypoxic respiratory failure on 7 L home O2, interstitial lung disease, rheumatoid arthritis, chronic diastolic CHF presented to the emergency room with dyspnea, lower extremity edema especially left leg including erythema tenderness and subjective fevers. -Chest x-ray noted mild pulmonary vascular congestion, she was admitted started on IV Lasix and antibiotic therapy for cellulitis. -Over the next 2 to 3 days she had worsening left leg changes with areas of black discoloration, vancomycin added, infectious disease was consulted, CT tib-fib obtained, no underlying abscess or fluid collection, changes of cellulitis noted, orthopedics was consulted as well recommended antibiotic therapy   Subjective: Some diarrhea overnight on antibiotics, no significant changes in her legs  Assessment and Plan:  Cellulitis of left lower extremity -Had clinical worsening over the last few days with some purulence and possible necrotic changes -Immunocompromised on baseline prednisone, hold leflunomide at this time -ID consulting, now on IV vancomycin and Unasyn -CT negative for abscess, osteomyelitis, upper.  Orthopedics input, medical management recommended -Pain control, PT OT  Acute on chronic diastolic CHF Pulmonary hypertension Echo EF 55 to 38%, RV systolic function preserved.  -Diuresed with IV Lasix earlier this admission, now has 1+ upper thigh edema -Resume of IV Lasix today -For SGLT2i candidate with limited mobility  Hypokalemia -Secondary to diarrhea and diuretics, replace  ILD (interstitial lung disease) (HCC) Chronic hypoxemic respiratory failure.  -On 7 L home O2 at baseline -Continue trilogy, Tyvaso, PPI, Singulair -CPAP nightly -followed by [facility]  Pulmonary hypertension ([place]) Continue with treprostinil and verapamil.   Hyperlipidemia  LDL goal <70 Continue with statin therapy.   ANXIETY DEPRESSION Continue with fluoxetine and trazodone.   Rheumatoid arthritis ([place]) Continue with oral prednisone, leflunamide-on hold  Obese Calculated BMI is 39,6 consistent with obesity class 2.   Hypokalemia -Replaced  DVT prophylaxis: Lovenox Code Status: Full code Family Communication: Discussed patient detail no family at bedside Disposition Plan: Home likely 3 to 4 days  Consultants: Infectious disease and orthopedics   Procedures:   Antimicrobials:    Objective: Vitals:   04/27/22 1300 04/27/22 2027 04/28/22 0641 04/28/22 0804  BP: (!) 142/62 (!) 151/66 (!) 154/68   Pulse: 74 79 89   Resp: 20  16   Temp: 98.6 F (37 C) 98.3 F (36.8 C) 98.2 F (36.8 C)   TempSrc: Oral Oral Oral   SpO2: 98% 98% 94% 97%  Weight:      Height:        Intake/Output Summary (Last 24 hours) at 04/28/2022 1128 Last data filed at 04/28/2022 1000 Gross per 24 hour  Intake 1080 ml  Output 802 ml  Net 278 ml   Filed Weights   04/25/22 0400 04/26/22 0400 04/27/22 0426  Weight: 89.9 kg 90.1 kg 88.4 kg    Examination:  General exam: Obese chronically ill female sitting up in bed, AAOx3, no distress HEENT: Positive JVD CVS: S1-S2, regular rhythm Lungs: Improved air movement, few basilar Rales Abdomen: Soft, nontender, bowel sounds present Extremities with redness swelling warmth and tenderness involving left lower leg, areas of dark discoloration 1+ edema in upper thighs Psychiatry:  Mood & affect appropriate.     Data Reviewed:   CBC: Recent Labs  Lab 04/23/22 0053 04/24/22 0107 04/25/22 0325 04/26/22 0241 04/28/22 0036  WBC 10.0 8.9 6.9 4.8 5.5  HGB 11.4* 11.3* 11.0* 11.3* 10.3*  HCT 35.6* 35.1* 35.5* 36.2  32.1*  MCV 100.3* 99.4 101.1* 100.0 99.4  PLT 113* 116* 123* 142* 915   Basic Metabolic Panel: Recent Labs  Lab 04/23/22 0053 04/24/22 0107 04/25/22 0325 04/27/22 0928 04/28/22 0036  NA 137 135  139 140 138  K 3.8 3.4* 4.4 3.2* 2.8*  CL 96* 94* 100 98 97*  CO2 33* 34* 36* 31 33*  GLUCOSE 194* 135* 140* 159* 152*  BUN _0 CREATININE 1.17* 0.85 0.91 0.76 0.95  CALCIUM 9.4 9.5 9.8 10.6* 9.6   GFR: Estimated Creatinine Clearance: 48.3 mL/min (by C-G formula based on SCr of 0.95 mg/dL). Liver Function Tests: No results for input(s): "AST", "ALT", "ALKPHOS", "BILITOT", "PROT", "ALBUMIN" in the last 168 hours.  No results for input(s): "LIPASE", "AMYLASE" in the last 168 hours. No results for input(s): "AMMONIA" in the last 168 hours. Coagulation Profile: No results for input(s): "INR", "PROTIME" in the last 168 hours. Cardiac Enzymes: Recent Labs  Lab 04/26/22 1844  CKTOTAL 32*   BNP (last 3 results) Recent Labs    12/09/21 1440  PROBNP 61.0   HbA1C: No results for input(s): "HGBA1C" in the last 72 hours. CBG: No results for input(s): "GLUCAP" in the last 168 hours. Lipid Profile: No results for input(s): "CHOL", "HDL", "LDLCALC", "TRIG", "CHOLHDL", "LDLDIRECT" in the last 72 hours. Thyroid Function Tests: No results for input(s): "TSH", "T4TOTAL", "FREET4", "T3FREE", "THYROIDAB" in the last 72 hours. Anemia Panel: No results for input(s): "VITAMINB12", "FOLATE", "FERRITIN", "TIBC", "IRON", "RETICCTPCT" in the last 72 hours. Urine analysis:    Component Value Date/Time   COLORURINE YELLOW 07/31/2013 [place] 07/31/2013 1733   LABSPEC 1.025 07/31/2013 1733   PHURINE 5.5 07/31/2013 1733   GLUCOSEU NEGATIVE 07/31/2013 1733   HGBUR NEGATIVE 07/31/2013 1733   BILIRUBINUR NEGATIVE 07/31/2013 1733   KETONESUR NEGATIVE 07/31/2013 1733   PROTEINUR 30 (A) 07/31/2013 1733   UROBILINOGEN 0.2 07/31/2013 1733   NITRITE NEGATIVE 07/31/2013 1733   LEUKOCYTESUR TRACE (A) 07/31/2013 1733   Sepsis Labs: _1 (procalcitonin:4,lacticidven:4)  )No results found for this or any previous visit (from the past 240 hour(s)).   Radiology  Studies: CT TIBIA FIBULA LEFT W CONTRAST  Result Date: 04/26/2022 CLINICAL DATA:  Thigh, calf, and ankle cellulitis. Worsening redness. Evaluate for underlying deep infection or abscess. EXAM: CT OF THE LOWER LEFT EXTREMITY WITH CONTRAST TECHNIQUE: Multidetector CT imaging of the lower left extremity was performed according to the standard protocol following intravenous contrast administration. RADIATION DOSE REDUCTION: This exam was performed according to the departmental dose-optimization program which includes automated exposure control, adjustment of the mA and/or kV according to patient size and/or use of iterative reconstruction technique. CONTRAST:  72m OMNIPAQUE IOHEXOL 350 MG/ML SOLN COMPARISON:  Left knee radiographs 09/22/2012 FINDINGS: Left femur: Bones/Joint/Cartilage There is diffuse decreased bone mineralization. Moderate joint space narrowing and peripheral osteophytosis of the pubic symphysis. Moderate superior left femoroacetabular joint space narrowing. Mild superior lateral left acetabular degenerative osteophytosis. Within the limitations of diffuse decreased bone mineralization, no acute fracture is seen. No definite cortical erosion is seen. Left sacroiliac joint partially visualized subchondral sclerosis and vacuum phenomenon. Within the limitations of metallic streak artifact from total left knee arthroplasty, no definite perihardware lucency is seen to indicate hardware failure or loosening. Ligaments Suboptimally assessed by CT. Muscles and Tendons There is mild-to-moderate fatty infiltration of the left semimembranosus muscle in the long head of the rectus femoris muscle with possible minimal muscle atrophy. No gross tendon tear is visualized.  Soft tissues There is mild soft tissue swelling and moderate edema/fat stranding seen throughout the left calf, greatest within the anterolateral proximal, mid, and distal left thigh and next greatest within the posteromedial mid to distal left  thigh. No walled-off fluid collection is seen. Moderate to high-grade atherosclerotic calcifications. There is minimal partial visualization of a high-grade sigmoid and descending colon diverticulosis. -- Left tibia and fibula: Bones/Joint/Cartilage Within the limitations of metallic streak artifact, no perihardware lucency is seen around the total left knee arthroplasty hardware to indicate hardware failure or loosening. Additional anterolateral distal tibial plate and screw fixation hardware without definite hardware complication. No acute fracture or cortical erosion is seen. Ligaments Suboptimally assessed by CT. Muscles and Tendons There is diffuse fatty infiltration seen throughout the anterior, lateral, deep posterior, and mid to distal aspect of the superficial posterior muscle compartments of the left calf. Soft tissues There is moderate swelling and edema/stranding seen throughout the left calf subcutaneous fat diffusely. No walled-off abscess is visualized. There are moderate to high-grade atherosclerotic calcifications. -- Left foot: Bones/Joint/Cartilage There is distal anterior tibial plate and screw fixation hardware that is also involves screws traversing the talar neck. 2/3 of the screws are fractured (sagittal series 22 images 47 through 49). There are additional screws traversing the medial cuneiform through the anterior process of the calcaneus and lateral cuneiform through the anterior process of the calcaneus. Old screw tracts from prior remote posterior subtalar arthrodesis screws. There is arthrodesis of the posterior, medial, and anterior subtalar joints, talonavicular joint, and the calcaneocuboid joint. High-grade joint space narrowing throughout the tarsometatarsal joints and the tibiotalar joint without osseous fusion. Additional screws are visualized within the second through fourth toes, spanning the PIP and DIP joints. No definite cortical erosion. Ligaments Suboptimally assessed by  CT. Muscles and Tendons High-grade diffuse muscle atrophy. Soft tissues Mild edema and swelling of the anterior, medial, and lateral ankle and dorsal midfoot subcutaneous fat diffusely. No walled-off fluid collection is seen. IMPRESSION: 1. No definite cortical erosion within the left femur, tibia/fibula, or left foot. 2. Diffuse left lower extremity subcutaneous fat edema and swelling compatible with cellulitis. No walled-off fluid collection is seen to indicate abscess. 3. 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 No evidence of hardware failure outside of remote fracturing of two screws within the distal aspect of the distal tibia-midfoot plate and screw fusion hardware. Electronically Signed   By: [name] M.D.   On: 04/26/2022 15:30   CT FOOT LEFT W CONTRAST  Result Date: 04/26/2022 CLINICAL DATA:  Thigh, calf, and ankle cellulitis. Worsening redness. Evaluate for underlying deep infection or abscess. EXAM: CT OF THE LOWER LEFT EXTREMITY WITH CONTRAST TECHNIQUE: Multidetector CT imaging of the lower left extremity was performed according to the standard protocol following intravenous contrast administration. RADIATION DOSE REDUCTION: This exam was performed according to the departmental dose-optimization program which includes automated exposure control, adjustment of the mA and/or kV according to patient size and/or use of iterative reconstruction technique. CONTRAST:  22m OMNIPAQUE IOHEXOL 350 MG/ML SOLN COMPARISON:  Left knee radiographs 09/22/2012 FINDINGS: Left femur: Bones/Joint/Cartilage There is diffuse decreased bone mineralization. Moderate joint space narrowing and peripheral osteophytosis of the pubic symphysis. Moderate superior left femoroacetabular joint space narrowing. Mild superior lateral left acetabular degenerative osteophytosis. Within the limitations of diffuse decreased bone mineralization, no acute fracture is seen. No definite cortical  erosion is seen. Left sacroiliac joint partially visualized subchondral sclerosis and vacuum phenomenon. Within the limitations of  metallic streak artifact from total left knee arthroplasty, no definite perihardware lucency is seen to indicate hardware failure or loosening. Ligaments Suboptimally assessed by CT. Muscles and Tendons There is mild-to-moderate fatty infiltration of the left semimembranosus muscle in the long head of the rectus femoris muscle with possible minimal muscle atrophy. No gross tendon tear is visualized. Soft tissues There is mild soft tissue swelling and moderate edema/fat stranding seen throughout the left calf, greatest within the anterolateral proximal, mid, and distal left thigh and next greatest within the posteromedial mid to distal left thigh. No walled-off fluid collection is seen. Moderate to high-grade atherosclerotic calcifications. There is minimal partial visualization of a high-grade sigmoid and descending colon diverticulosis. -- Left tibia and fibula: Bones/Joint/Cartilage Within the limitations of metallic streak artifact, no perihardware lucency is seen around the total left knee arthroplasty hardware to indicate hardware failure or loosening. Additional anterolateral distal tibial plate and screw fixation hardware without definite hardware complication. No acute fracture or cortical erosion is seen. Ligaments Suboptimally assessed by CT. Muscles and Tendons There is diffuse fatty infiltration seen throughout the anterior, lateral, deep posterior, and mid to distal aspect of the superficial posterior muscle compartments of the left calf. Soft tissues There is moderate swelling and edema/stranding seen throughout the left calf subcutaneous fat diffusely. No walled-off abscess is visualized. There are moderate to high-grade atherosclerotic calcifications. -- Left foot: Bones/Joint/Cartilage There is distal anterior tibial plate and screw fixation hardware that is also  involves screws traversing the talar neck. 2/3 of the screws are fractured (sagittal series 22 images 47 through 49). There are additional screws traversing the medial cuneiform through the anterior process of the calcaneus and lateral cuneiform through the anterior process of the calcaneus. Old screw tracts from prior remote posterior subtalar arthrodesis screws. There is arthrodesis of the posterior, medial, and anterior subtalar joints, talonavicular joint, and the calcaneocuboid joint. High-grade joint space narrowing throughout the tarsometatarsal joints and the tibiotalar joint without osseous fusion. Additional screws are visualized within the second through fourth toes, spanning the PIP and DIP joints. No definite cortical erosion. Ligaments Suboptimally assessed by CT. Muscles and Tendons High-grade diffuse muscle atrophy. Soft tissues Mild edema and swelling of the anterior, medial, and lateral ankle and dorsal midfoot subcutaneous fat diffusely. No walled-off fluid collection is seen. IMPRESSION: 1. No definite cortical erosion within the left femur, tibia/fibula, or left foot. 2. Diffuse left lower extremity subcutaneous fat edema and swelling compatible with cellulitis. No walled-off fluid collection is seen to indicate abscess. 3. 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 No evidence of hardware failure outside of remote fracturing of two screws within the distal aspect of the distal tibia-midfoot plate and screw fusion hardware. Electronically Signed   By: [name] M.D.   On: 04/26/2022 15:30   CT FEMUR LEFT W CONTRAST  Result Date: 04/26/2022 CLINICAL DATA:  Thigh, calf, and ankle cellulitis. Worsening redness. Evaluate for underlying deep infection or abscess. EXAM: CT OF THE LOWER LEFT EXTREMITY WITH CONTRAST TECHNIQUE: Multidetector CT imaging of the lower left extremity was performed according to the standard protocol following intravenous contrast  administration. RADIATION DOSE REDUCTION: This exam was performed according to the departmental dose-optimization program which includes automated exposure control, adjustment of the mA and/or kV according to patient size and/or use of iterative reconstruction technique. CONTRAST:  52m OMNIPAQUE IOHEXOL 350 MG/ML SOLN COMPARISON:  Left knee radiographs 09/22/2012 FINDINGS: Left femur: Bones/Joint/Cartilage There is diffuse decreased bone mineralization. Moderate  joint space narrowing and peripheral osteophytosis of the pubic symphysis. Moderate superior left femoroacetabular joint space narrowing. Mild superior lateral left acetabular degenerative osteophytosis. Within the limitations of diffuse decreased bone mineralization, no acute fracture is seen. No definite cortical erosion is seen. Left sacroiliac joint partially visualized subchondral sclerosis and vacuum phenomenon. Within the limitations of metallic streak artifact from total left knee arthroplasty, no definite perihardware lucency is seen to indicate hardware failure or loosening. Ligaments Suboptimally assessed by CT. Muscles and Tendons There is mild-to-moderate fatty infiltration of the left semimembranosus muscle in the long head of the rectus femoris muscle with possible minimal muscle atrophy. No gross tendon tear is visualized. Soft tissues There is mild soft tissue swelling and moderate edema/fat stranding seen throughout the left calf, greatest within the anterolateral proximal, mid, and distal left thigh and next greatest within the posteromedial mid to distal left thigh. No walled-off fluid collection is seen. Moderate to high-grade atherosclerotic calcifications. There is minimal partial visualization of a high-grade sigmoid and descending colon diverticulosis. -- Left tibia and fibula: Bones/Joint/Cartilage Within the limitations of metallic streak artifact, no perihardware lucency is seen around the total left knee arthroplasty hardware to  indicate hardware failure or loosening. Additional anterolateral distal tibial plate and screw fixation hardware without definite hardware complication. No acute fracture or cortical erosion is seen. Ligaments Suboptimally assessed by CT. Muscles and Tendons There is diffuse fatty infiltration seen throughout the anterior, lateral, deep posterior, and mid to distal aspect of the superficial posterior muscle compartments of the left calf. Soft tissues There is moderate swelling and edema/stranding seen throughout the left calf subcutaneous fat diffusely. No walled-off abscess is visualized. There are moderate to high-grade atherosclerotic calcifications. -- Left foot: Bones/Joint/Cartilage There is distal anterior tibial plate and screw fixation hardware that is also involves screws traversing the talar neck. 2/3 of the screws are fractured (sagittal series 22 images 47 through 49). There are additional screws traversing the medial cuneiform through the anterior process of the calcaneus and lateral cuneiform through the anterior process of the calcaneus. Old screw tracts from prior remote posterior subtalar arthrodesis screws. There is arthrodesis of the posterior, medial, and anterior subtalar joints, talonavicular joint, and the calcaneocuboid joint. High-grade joint space narrowing throughout the tarsometatarsal joints and the tibiotalar joint without osseous fusion. Additional screws are visualized within the second through fourth toes, spanning the PIP and DIP joints. No definite cortical erosion. Ligaments Suboptimally assessed by CT. Muscles and Tendons High-grade diffuse muscle atrophy. Soft tissues Mild edema and swelling of the anterior, medial, and lateral ankle and dorsal midfoot subcutaneous fat diffusely. No walled-off fluid collection is seen. IMPRESSION: 1. No definite cortical erosion within the left femur, tibia/fibula, or left foot. 2. Diffuse left lower extremity subcutaneous fat edema and  swelling compatible with cellulitis. No walled-off fluid collection is seen to indicate abscess. 3. 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 No evidence of hardware failure outside of remote fracturing of two screws within the distal aspect of the distal tibia-midfoot plate and screw fusion hardware. Electronically Signed   By: [name] M.D.   On: 04/26/2022 15:30     Scheduled Meds:  aspirin EC  81 mg Oral Daily   colestipol  1 g Oral Daily   enoxaparin (LOVENOX) injection  40 mg Subcutaneous Q24H   FLUoxetine  20 mg Oral Daily   fluticasone  2 spray Each Nare Daily   fluticasone furoate-vilanterol  1 puff Inhalation Daily  And   umeclidinium bromide  1 puff Inhalation Daily   furosemide  60 mg Intravenous Once   ipratropium  1 spray Each Nare BID   loratadine  10 mg Oral Daily   montelukast  10 mg Oral QHS   pantoprazole  40 mg Oral Daily   Pirfenidone  801 mg Oral TID   potassium chloride  40 mEq Oral Q4H   potassium chloride  40 mEq Oral Once   predniSONE  5 mg Oral Q breakfast   rosuvastatin  20 mg Oral Daily   saccharomyces boulardii  250 mg Oral BID   sodium chloride flush  3 mL Intravenous Q12H   traZODone  100 mg Oral QHS   Treprostinil  1 puff Inhalation QID   verapamil  300 mg Oral Daily   Continuous Infusions:  ampicillin-sulbactam (UNASYN) IV 3 g (04/28/22 0636)   vancomycin 750 mg (04/28/22 0947)     LOS: 6 days    Time spent: 27mn    PDomenic Polite MD [facility]   04/28/2022, 11:28 AM

## 2022-04-28 NOTE — Progress Notes (Signed)
Patient does not wish to use CPAP.  On 7L HFNC tolerating well.

## 2022-04-28 NOTE — Progress Notes (Signed)
PT Cancellation Note  Patient Details Name: [name] MRN: [id number] DOB: April 25, 1946   Cancelled Treatment:    Reason Eval/Treat Not Completed: Medical issues which prohibited therapy. Pt having diarrhea upon PT arrival. Pt requests PT return tomorrow.   Zenaida Niece 04/28/2022, 4:24 PM

## 2022-04-28 NOTE — Progress Notes (Signed)
Pharmacy Antibiotic Note  [name] is a 75 y.o. female admitted on 04/20/2022 with worsening leg swelling >> purulence with necrosis.  Pharmacy consulted to dose vancomycin, and Rocephin was broadened to Unasyn per ID.  Renal functions table, afebrile, WBC WNL.  Plan: Continue vanc 775m IV Q24H for AUC 480 (goal 400-550) Unasyn 3gm IV Q6H per MD Monitor renal fxn, clinical progress, vanc AUC soon  Height: 5' (152.4 cm) Weight: 88.4 kg (194 lb 14.2 oz) IBW/kg (Calculated) : 45.5  Temp (24hrs), Avg:98.4 F (36.9 C), Min:98.2 F (36.8 C), Max:98.6 F (37 C)  Recent Labs  Lab 04/23/22 0053 04/24/22 0107 04/25/22 0325 04/26/22 0241 04/27/22 0928 04/28/22 0036  WBC 10.0 8.9 6.9 4.8  --  5.5  CREATININE 1.17* 0.85 0.91  --  0.76 0.95     Estimated Creatinine Clearance: 48.3 mL/min (by C-G formula based on SCr of 0.95 mg/dL).    Allergies  Allergen Reactions   Cefdinir Diarrhea   Erythromycin Base Diarrhea   Lactose Dermatitis   Lactulose Diarrhea   Mesalamine Nausea Only   Methotrexate Other (See Comments)    Felt sick   Nitrofuran Derivatives     shakiness   Other Diarrhea and Other (See Comments)    Shaking uncontrollably "lettuce only" "lettuce only"   Sulfa Antibiotics Other (See Comments)    Patient can't recall reaction    Tdap [Tetanus-Diphth-Acell Pertussis]     Shaking uncontrollably   Tetanus Toxoid, Adsorbed Other (See Comments)    Shaking uncontrollable     Rocephin 10/5 >> 10/10 Vanc 10/9 >>  Unasyn 10/10 >>   Kinzlie Harney D. DMina Marble PharmD, BCPS, BCad[place]10/06/2022, 8:22 AM

## 2022-04-29 DIAGNOSIS — L03116 Cellulitis of left lower limb: Secondary | ICD-10-CM | POA: Diagnosis not present

## 2022-04-29 LAB — CBC
HCT: 32.6 % — ABNORMAL LOW (ref 36.0–46.0)
Hemoglobin: 10.2 g/dL — ABNORMAL LOW (ref 12.0–15.0)
MCH: 31.6 pg (ref 26.0–34.0)
MCHC: 31.3 g/dL (ref 30.0–36.0)
MCV: 100.9 fL — ABNORMAL HIGH (ref 80.0–100.0)
Platelets: 221 10*3/uL (ref 150–400)
RBC: 3.23 MIL/uL — ABNORMAL LOW (ref 3.87–5.11)
RDW: 13.6 % (ref 11.5–15.5)
WBC: 5.5 10*3/uL (ref 4.0–10.5)
nRBC: 0 % (ref 0.0–0.2)

## 2022-04-29 LAB — BASIC METABOLIC PANEL
Anion gap: 6 (ref 5–15)
BUN: 13 mg/dL (ref 8–23)
CO2: 35 mmol/L — ABNORMAL HIGH (ref 22–32)
Calcium: 9.5 mg/dL (ref 8.9–10.3)
Chloride: 100 mmol/L (ref 98–111)
Creatinine, Ser: 0.79 mg/dL (ref 0.44–1.00)
GFR, Estimated: >60 mL/min (ref >60)
Glucose, Bld: 107 mg/dL — ABNORMAL HIGH (ref 70–99)
Potassium: 4.3 mmol/L (ref 3.5–5.1)
Sodium: 141 mmol/L (ref 135–145)

## 2022-04-29 MED ORDER — POTASSIUM CHLORIDE CRYS ER 20 MEQ PO TBCR
40.0000 meq | EXTENDED_RELEASE_TABLET | Freq: Once | ORAL | Status: AC
  Administered 2022-04-29: 40 meq via ORAL
  Filled 2022-04-29: qty 2

## 2022-04-29 MED ORDER — LOPERAMIDE HCL 2 MG PO CAPS
2.0000 mg | ORAL_CAPSULE | Freq: Three times a day (TID) | ORAL | Status: DC | PRN
  Administered 2022-04-30 – 2022-05-02 (×2): 2 mg via ORAL
  Filled 2022-04-29 (×2): qty 1

## 2022-04-29 MED ORDER — FUROSEMIDE 10 MG/ML IJ SOLN
60.0000 mg | Freq: Once | INTRAMUSCULAR | Status: AC
  Administered 2022-04-29: 60 mg via INTRAVENOUS
  Filled 2022-04-29: qty 6

## 2022-04-29 NOTE — Progress Notes (Addendum)
PROGRESS NOTE    [name]  ERX:[id number] DOB: March 23, 1946 DOA: 04/20/2022 PCP: Lorene Dy, MD  76/F with history of chronic hypoxic respiratory failure on 7 L home O2, interstitial lung disease, rheumatoid arthritis, chronic diastolic CHF presented to the emergency room with dyspnea, lower extremity edema especially left leg including erythema tenderness and subjective fevers. -Chest x-ray noted mild pulmonary vascular congestion, she was admitted started on IV Lasix and antibiotic therapy for cellulitis. -Over the next 2 to 3 days she had worsening left leg changes with areas of black discoloration, vancomycin added, infectious disease was consulted, CT tib-fib obtained, no underlying abscess or fluid collection, changes of cellulitis noted, orthopedics was consulted as well recommended antibiotic therapy   Subjective: -Continues to have diarrhea secondary to antibiotics, leg is largely unchanged  Assessment and Plan:  Cellulitis of left lower extremity -Had clinical worsening over the last few days with some purulence and possible necrotic changes -Immunocompromised on baseline prednisone, hold leflunomide at this time -ID consulting, now on IV vancomycin and Unasyn, minimal improvement -CT negative for abscess, osteomyelitis, upper.  Orthopedics input, medical management recommended -Pain control, PT OT -Out of bed to chair  Acute on chronic diastolic CHF Pulmonary hypertension Echo EF 55 to 95%, RV systolic function preserved.  -Diuresed with IV Lasix earlier this admission, now has 1+ upper thigh edema -Repeat IV Lasix today -poor SGLT2i candidate with limited mobility  Hypokalemia -Secondary to diarrhea and diuretics, replace  ILD (interstitial lung disease) (HCC) Chronic hypoxemic respiratory failure.  -On 7 L home O2 at baseline -Continue trilogy, Tyvaso, pirfenidone, PPI, Singulair -CPAP nightly -followed by [facility]  Pulmonary hypertension  ([place]) Continue with treprostinil and verapamil.   Hyperlipidemia LDL goal <70 Continue with statin therapy.   ANXIETY DEPRESSION Continue with fluoxetine and trazodone.   Rheumatoid arthritis ([place]) Continue with oral prednisone, leflunamide-on hold  Obese Calculated BMI is 39,6 consistent with obesity class 2.   Hypokalemia -Replaced  DVT prophylaxis: Lovenox Code Status: Full code Family Communication: Discussed patient detail no family at bedside Disposition Plan: Home likely 3 to 4 days  Consultants: Infectious disease and orthopedics   Procedures:   Antimicrobials:    Objective: Vitals:   04/28/22 2122 04/29/22 0744 04/29/22 0818 04/29/22 0825  BP:  (!) 143/80    Pulse:  80    Resp:  18    Temp:  98 F (36.7 C)    TempSrc:  Oral    SpO2: 97% 96% 96% 92%  Weight:      Height:        Intake/Output Summary (Last 24 hours) at 04/29/2022 1132 Last data filed at 04/29/2022 0932 Gross per 24 hour  Intake 1180 ml  Output 2003 ml  Net -823 ml   Filed Weights   04/25/22 0400 04/26/22 0400 04/27/22 0426  Weight: 89.9 kg 90.1 kg 88.4 kg    Examination:  General exam: Obese chronically ill female sitting up in bed, AAOx3, no distress HEENT: Positive JVD CVS: S1-S2, regular rhythm Lungs: Decreased breath sounds to bases otherwise clear Abdomen: Soft, obese, nontender, bowel sounds present Extremities with redness swelling warmth and tenderness involving left lower leg, areas of dark discoloration 1+ edema in upper thighs Psychiatry:  Mood & affect appropriate.     Data Reviewed:   CBC: Recent Labs  Lab 04/24/22 0107 04/25/22 0325 04/26/22 0241 04/28/22 0036 04/29/22 0134  WBC 8.9 6.9 4.8 5.5 5.5  HGB 11.3* 11.0* 11.3* 10.3* 10.2*  HCT 35.1* 35.5* 36.2  32.1* 32.6*  MCV 99.4 101.1* 100.0 99.4 100.9*  PLT 116* 123* 142* 184 953   Basic Metabolic Panel: Recent Labs  Lab 04/24/22 0107 04/25/22 0325 04/27/22 0928 04/28/22 0036 04/29/22 0134   NA 135 139 140 138 141  K 3.4* 4.4 3.2* 2.8* 4.3  CL 94* 100 98 97* 100  CO2 34* 36* 31 33* 35*  GLUCOSE 135* 140* 159* 152* 107*  BUN _0 CREATININE 0.85 0.91 0.76 0.95 0.79  CALCIUM 9.5 9.8 10.6* 9.6 9.5   GFR: Estimated Creatinine Clearance: 57.4 mL/min (by C-G formula based on SCr of 0.79 mg/dL). Liver Function Tests: No results for input(s): "AST", "ALT", "ALKPHOS", "BILITOT", "PROT", "ALBUMIN" in the last 168 hours.  No results for input(s): "LIPASE", "AMYLASE" in the last 168 hours. No results for input(s): "AMMONIA" in the last 168 hours. Coagulation Profile: No results for input(s): "INR", "PROTIME" in the last 168 hours. Cardiac Enzymes: Recent Labs  Lab 04/26/22 1844  CKTOTAL 32*   BNP (last 3 results) Recent Labs    12/09/21 1440  PROBNP 61.0   HbA1C: No results for input(s): "HGBA1C" in the last 72 hours. CBG: No results for input(s): "GLUCAP" in the last 168 hours. Lipid Profile: No results for input(s): "CHOL", "HDL", "LDLCALC", "TRIG", "CHOLHDL", "LDLDIRECT" in the last 72 hours. Thyroid Function Tests: No results for input(s): "TSH", "T4TOTAL", "FREET4", "T3FREE", "THYROIDAB" in the last 72 hours. Anemia Panel: No results for input(s): "VITAMINB12", "FOLATE", "FERRITIN", "TIBC", "IRON", "RETICCTPCT" in the last 72 hours. Urine analysis:    Component Value Date/Time   COLORURINE YELLOW 07/31/2013 [place] 07/31/2013 1733   LABSPEC 1.025 07/31/2013 1733   PHURINE 5.5 07/31/2013 1733   GLUCOSEU NEGATIVE 07/31/2013 1733   HGBUR NEGATIVE 07/31/2013 1733   BILIRUBINUR NEGATIVE 07/31/2013 1733   KETONESUR NEGATIVE 07/31/2013 1733   PROTEINUR 30 (A) 07/31/2013 1733   UROBILINOGEN 0.2 07/31/2013 1733   NITRITE NEGATIVE 07/31/2013 1733   LEUKOCYTESUR TRACE (A) 07/31/2013 1733   Sepsis Labs: _1 (procalcitonin:4,lacticidven:4)  )No results found for this or any previous visit (from the past 240 hour(s)).    Radiology Studies: No results found.   Scheduled Meds:  aspirin EC  81 mg Oral Daily   colestipol  1 g Oral Daily   enoxaparin (LOVENOX) injection  40 mg Subcutaneous Q24H   FLUoxetine  20 mg Oral Daily   fluticasone  2 spray Each Nare Daily   fluticasone furoate-vilanterol  1 puff Inhalation Daily   And   umeclidinium bromide  1 puff Inhalation Daily   hydrocortisone cream   Topical BID   ipratropium  1 spray Each Nare BID   loratadine  10 mg Oral Daily   montelukast  10 mg Oral QHS   pantoprazole  40 mg Oral Daily   Pirfenidone  801 mg Oral TID   predniSONE  5 mg Oral Q breakfast   rosuvastatin  20 mg Oral Daily   saccharomyces boulardii  250 mg Oral BID   sodium chloride flush  3 mL Intravenous Q12H   traZODone  100 mg Oral QHS   Treprostinil  1 puff Inhalation QID   verapamil  300 mg Oral Daily   Continuous Infusions:  ampicillin-sulbactam (UNASYN) IV 3 g (04/29/22 2023)   vancomycin 750 mg (04/29/22 0946)     LOS: 7 days    Time spent: 10mn    PDomenic Polite MD [facility]   04/29/2022, 11:32 AM

## 2022-04-29 NOTE — Progress Notes (Signed)
OT Cancellation Note  Patient Details Name: LATEKA RADY MRN: [id number] DOB: Jul 09, 1946   Cancelled Treatment:    Reason Eval/Treat Not Completed: Pain limiting ability to participate: Pt reporting 7/10 pain and has been waiting 2 hours for pain meds. CNA in room and reported that meds are on the way. OT will hold for now to allow for pain management.   Julien Girt 04/29/2022, 11:49 AM

## 2022-04-29 NOTE — Progress Notes (Signed)
Physical Therapy Treatment Patient Details Name: [name] MRN: [id number] DOB: [birth year] Today's Date: 04/29/2022   History of Present Illness Patient is a 76 yo female presenting to the [facility] ED with BLE swelling and SOB on 04/20/22. Admitted with LLE cellulitis and decompensated heart failure on 04/23/22.  PMH includes: HTN, HFpEF, asthmatic bronchitis, PAH, ILD, chronic respiratory failure on 7 L of oxygen, rheumatoid arthritis on chronic immunosuppression, and OSA    PT Comments    Pt was seen for mobility on RW in room, with O2 at 8L due to ability on tank to adjust.  Pt is dropping to 86% initially to walk then 85% but very quickly is recovering.  Talked with pulm specialist when PT session was ending, and this gave pt opportunity to ask about her line length for home.  Follow along with her to manage sats, keep pt on shorter O2 line per guidance with specialist and monitor her tolerance for gait as well as her safety with mobility devices.   Recommendations for follow up therapy are one component of a multi-disciplinary discharge planning process, led by the attending physician.  Recommendations may be updated based on patient status, additional functional criteria and insurance authorization.  Follow Up Recommendations  Home health PT     Assistance Recommended at Discharge Intermittent Supervision/Assistance  Patient can return home with the following A little help with walking and/or transfers;A little help with bathing/dressing/bathroom;Help with stairs or ramp for entrance;Assist for transportation;Assistance with cooking/housework   Equipment Recommendations  None recommended by PT    Recommendations for Other Services       Precautions / Restrictions Precautions Precautions: Fall Precaution Comments: LLE cellulitis Restrictions Weight Bearing Restrictions: No     Mobility  Bed Mobility Overal bed mobility: Needs Assistance Bed Mobility: Supine to Sit, Sit  to Supine     Supine to sit: Min guard Sit to supine: Min assist   General bed mobility comments: assisted LLE to bed and elevation    Transfers Overall transfer level: Needs assistance Equipment used: Rolling walker (2 wheels) Transfers: Sit to/from Stand Sit to Stand: Min guard           General transfer comment: reminders as pt tries to support on walker to stand    Ambulation/Gait Ambulation/Gait assistance: Min guard Gait Distance (Feet): 90 Feet (45 x 2) Assistive device: Rolling walker (2 wheels) Gait Pattern/deviations: Step-through pattern, Decreased stride length, Decreased weight shift to left Gait velocity: Decreased Gait velocity interpretation: <1.31 ft/sec, indicative of household ambulator Pre-gait activities: standing balance ck General Gait Details: mild SOB but HR was controlled with 8 L O2, sat post walk of 8L was 86% briefly in first walk, 85% briefly second walk   Stairs             Wheelchair Mobility    Modified Rankin (Stroke Patients Only)       Balance Overall balance assessment: Needs assistance Sitting-balance support: Feet supported Sitting balance-Leahy Scale: Fair     Standing balance support: Bilateral upper extremity supported, During functional activity Standing balance-Leahy Scale: Fair                              Cognition Arousal/Alertness: Awake/alert Behavior During Therapy: WFL for tasks assessed/performed Overall Cognitive Status: Within Functional Limits for tasks assessed  General Comments: pt is following instructions with PT and did not have any questions about memory from past events that were an issue        Exercises      General Comments General comments (skin integrity, edema, etc.): Pt is up to walk with minimal help and on O2 but does desat both walks with HR controlled      Pertinent Vitals/Pain Pain Assessment Pain Assessment:  Faces Faces Pain Scale: Hurts little more Pain Location: LLE Pain Descriptors / Indicators: Guarding, Grimacing Pain Intervention(s): Monitored during session, Repositioned, Premedicated before session    Home Living                          Prior Function            PT Goals (current goals can now be found in the care plan section) Acute Rehab PT Goals Patient Stated Goal: to go home Progress towards PT goals: Progressing toward goals    Frequency    Min 3X/week      PT Plan Current plan remains appropriate    Co-evaluation              AM-PAC PT "6 Clicks" Mobility   Outcome Measure  Help needed turning from your back to your side while in a flat bed without using bedrails?: A Little Help needed moving from lying on your back to sitting on the side of a flat bed without using bedrails?: A Little Help needed moving to and from a bed to a chair (including a wheelchair)?: A Little Help needed standing up from a chair using your arms (e.g., wheelchair or bedside chair)?: A Little Help needed to walk in hospital room?: A Little Help needed climbing 3-5 steps with a railing? : A Lot 6 Click Score: 17    End of Session Equipment Utilized During Treatment: Gait belt;Oxygen Activity Tolerance: Patient limited by fatigue Patient left: in bed;with call bell/phone within reach;with family/visitor present Nurse Communication: Mobility status PT Visit Diagnosis: Unsteadiness on feet (R26.81);Muscle weakness (generalized) (M62.81);Difficulty in walking, not elsewhere classified (R26.2)     Time: 3888-2800 PT Time Calculation (min) (ACUTE ONLY): 34 min  Charges:  $Gait Training: 8-22 mins $Therapeutic Activity: 8-22 mins    Ramond Dial 04/29/2022, 4:09 PM  Mee Hives, PT PhD Acute Rehab Dept. Number: [place] [facility] [place]

## 2022-04-30 DIAGNOSIS — L03116 Cellulitis of left lower limb: Secondary | ICD-10-CM | POA: Diagnosis not present

## 2022-04-30 LAB — BASIC METABOLIC PANEL
Anion gap: 5 (ref 5–15)
BUN: 12 mg/dL (ref 8–23)
CO2: 35 mmol/L — ABNORMAL HIGH (ref 22–32)
Calcium: 9.6 mg/dL (ref 8.9–10.3)
Chloride: 99 mmol/L (ref 98–111)
Creatinine, Ser: 0.69 mg/dL (ref 0.44–1.00)
GFR, Estimated: >60 mL/min (ref >60)
Glucose, Bld: 123 mg/dL — ABNORMAL HIGH (ref 70–99)
Potassium: 3 mmol/L — ABNORMAL LOW (ref 3.5–5.1)
Sodium: 139 mmol/L (ref 135–145)

## 2022-04-30 LAB — C-REACTIVE PROTEIN: CRP: <0.5 mg/dL (ref <1.0)

## 2022-04-30 LAB — SEDIMENTATION RATE: Sed Rate: 3 mm/hr (ref 0–22)

## 2022-04-30 MED ORDER — POTASSIUM CHLORIDE CRYS ER 20 MEQ PO TBCR
40.0000 meq | EXTENDED_RELEASE_TABLET | ORAL | Status: AC
  Administered 2022-04-30 (×2): 40 meq via ORAL
  Filled 2022-04-30 (×2): qty 2

## 2022-04-30 MED ORDER — FUROSEMIDE 10 MG/ML IJ SOLN
60.0000 mg | Freq: Once | INTRAMUSCULAR | Status: AC
  Administered 2022-04-30: 60 mg via INTRAVENOUS
  Filled 2022-04-30: qty 6

## 2022-04-30 NOTE — Progress Notes (Addendum)
[facility] Infectious Diseases Follow Up Note  Patient Identification: Patient Name: [name] MRN: [id number] [place] Date: 04/20/2022  6:00 PM Age: 76 y.o.Today's Date: 04/30/2022  Reason for Visit: LLE cellulitis   Principal Problem:   Cellulitis of left lower extremity Active Problems:   ANXIETY DEPRESSION   Rheumatoid arthritis ([place])   ILD (interstitial lung disease) ([place])   Obstructive sleep apnea   Pulmonary hypertension (HCC)   Hyperlipidemia LDL goal <70   Obese   Acute on chronic diastolic CHF (congestive heart failure) (HCC)   Hypokalemia  Antibiotics:  Ceftriaxone 10/5-10/9, Unasyn 10/10-c  Fluconazole 10/5 Vancomycin 10/9-c   Lines/Hardware: Right THA and bilateral knee arthroplasty, left ankle hardware  Interval Events: Remains afebrile, no leukocytosis.    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 ID engaged for worsening erythema on IV abtx.   10/10 CK 32  CT left foot, left tibia-fibula and left femur on 10/10 noted Evaluated by orthopedics on 10/11 with improved findings and no need for operative intervention.  Clinically improving with improving redness albeit slow which is expected. No concern of involvement of left ankle as well as left Prosthetic knee at this time clinically as well as per CT  Leflunomide on hold   Loose stools- likely abtx induced diarrhea, no clinical concerns for C diff, monitor.   Recommendations Continue vancomycin,, pharmacy to dose and Unasyn as is. It appears patient is planned for discharge early next week, would  transition to PO doxycycline and augmentin during discharge to complete 14  days  Keep legs elevated Diuretics for leg edema per primary  ID available as needed, please call with questions.  Rest of the management as per the primary team. Thank you for the consult. Please page with pertinent questions or concerns.  ______________________________________________________________________ Subjective patient seen and examined at the bedside.  Thinks redness in her left leg is improving.  Denies any pain or tenderness in her left ankle or left knee joint  Has some loose stool, no fevers, abdominal cramps , likely abtx induced diarrhea   Vitals BP (!) 162/77   Pulse 79   Temp 97.9 F (36.6 C) (Oral)   Resp 20   Ht 5' (1.524 m)   Wt 88.4 kg   SpO2 97%   BMI 38.06 kg/m     Physical Exam Constitutional: Sitting up in the bed and appears comfortable    Comments:   Cardiovascular:     Rate and Rhythm: Normal rate and regular rhythm.     Heart sounds:   Pulmonary:     Effort: Pulmonary effort is normal on [place]    Comments:   Abdominal:     Palpations: Abdomen is soft.     Tenderness: Nondistended and nontender  Musculoskeletal:        General: Left hip with no swelling and tenderness with good range of motion.                    Left knee with no swelling and tenderness with good range of motion                   Left ankle is fused.  Swelling, no warmth and tenderness to suggest septic joint  Left leg with improving erythema, mild tenderness, no warmth.  Early bullae in the left anterolateral leg.  No crepitus or fluctuance.  Skin is shrinking suggestive of improved edema  Skin:    Comments: as above  Neurological:     General: Grossly nonfocal, awake alert and oriented  Psychiatric:        Mood and Affect: Mood normal.   Pertinent Microbiology Results for orders placed or performed during the hospital encounter of 11/06/20  SARS CORONAVIRUS 2 (TAT 6-24 HRS) Nasopharyngeal Nasopharyngeal Swab     Status: None   Collection Time:  11/06/20 11:22 AM   Specimen: Nasopharyngeal Swab  Result Value Ref Range Status   SARS Coronavirus 2 NEGATIVE NEGATIVE Final    Comment: (NOTE) SARS-CoV-2 target nucleic acids are NOT DETECTED.  The SARS-CoV-2 RNA is generally detectable in upper and lower respiratory specimens during the acute phase of infection. Negative results do not preclude SARS-CoV-2 infection, do not rule out co-infections with other pathogens, and should not be used as the sole basis for treatment or other patient management decisions. Negative results must be combined with clinical observations, patient history, and epidemiological information. The expected result is Negative.  Fact Sheet for Patients: SugarRoll.be  Fact Sheet for Healthcare Providers: https://www.woods-mathews.com/  This test is not yet approved or cleared by the [place] FDA and  has been authorized for detection and/or diagnosis of SARS-CoV-2 by FDA under an Emergency Use Authorization (EUA). This EUA will remain  in effect (meaning this test can be used) for the duration of the COVID-19 declaration under Se ction 564(b)(1) of the Act, 21 U.S.C. section 360bbb-3(b)(1), unless the authorization is terminated or revoked sooner.  Performed at [facility], [place] [address], [place], [place] [address]      Pertinent Lab.    Latest Ref Rng & Units 04/29/2022    1:34 AM 04/28/2022   12:36 AM 04/26/2022    2:41 AM  CBC  WBC 4.0 - 10.5 K/uL 5.5  5.5  4.8   Hemoglobin 12.0 - 15.0 g/dL 10.2  10.3  11.3   Hematocrit 36.0 - 46.0 % 32.6  32.1  36.2   Platelets 150 - 400 K/uL 221  184  142       Latest Ref Rng & Units 04/30/2022   12:45 AM 04/29/2022    1:34 AM 04/28/2022   12:36 AM  CMP  Glucose 70 - 99 mg/dL 123  107  152   BUN 8 - 23 mg/dL _0 Creatinine 0.44 - 1.00 mg/dL 0.69  0.79  0.95   Sodium 135 - 145 mmol/L 139  141  138   Potassium 3.5 - 5.1 mmol/L 3.0  4.3   2.8   Chloride 98 - 111 mmol/L 99  100  97   CO2 22 - 32 mmol/L 35  35  33   Calcium 8.9 - 10.3 mg/dL 9.6  9.5  9.6      Pertinent Imaging today Plain films and CT images have been personally visualized and interpreted; radiology reports have been reviewed. Decision making incorporated into the Impression / Recommendations.  No results found.  I spent 55 minutes for this patient encounter including review of prior medical records, coordination of care with primary/other specialist with greater than 50% of time being face to face/counseling and discussing diagnostics/treatment plan with the patient/family.  Electronically signed by:   Rosiland Oz, MD Infectious Disease Physician [facility]  for Infectious Disease Pager: [phone]

## 2022-04-30 NOTE — Progress Notes (Signed)
PROGRESS NOTE    [name]  UYQ:[id number] DOB: Dec 14, 1945 DOA: 04/20/2022 PCP: Lorene Dy, MD  76/F with history of chronic hypoxic respiratory failure on 7 L home O2, interstitial lung disease, rheumatoid arthritis, chronic diastolic CHF presented to the emergency room with dyspnea, lower extremity edema especially left leg including erythema tenderness and subjective fevers. -Chest x-ray noted mild pulmonary vascular congestion, she was admitted started on IV Lasix and antibiotic therapy for cellulitis. -Over the next 2 to 3 days she had worsening left leg changes with areas of black discoloration, vancomycin added, infectious disease was consulted, CT tib-fib obtained, no underlying abscess or fluid collection, changes of cellulitis noted, orthopedics was consulted as well recommended antibiotic therapy   Subjective: -Continues to have diarrhea secondary to antibiotics, leg is largely unchanged  Assessment and Plan:  Cellulitis of left lower extremity -Very slow to improve followed by clinical worsening with some purulence and possible necrotic changes -Immunocompromised on baseline prednisone, leflunomide held in the setting of infection -ID consulting, now on IV vancomycin and Unasyn, minimal improvement -CT negative for abscess, osteomyelitis, upper.  Orthopedics input, medical management recommended -Continue left leg elevation, diuretics -Pain control, PT OT -Out of bed to chair  Acute on chronic diastolic CHF Pulmonary hypertension Echo EF 55 to 63%, RV systolic function preserved.  -Diuresed with IV Lasix earlier this admission, now has 1+ upper thigh edema -Reviewing, will repeat IV Lasix today, she is 8 L negative -poor SGLT2i candidate with limited mobility  Chronic diarrhea -Secondary to IBS and pirfenidone, now worse in the setting of antibiotics -Continue Imodium  Hypokalemia -Secondary to diarrhea and diuretics, replaced  ILD (interstitial lung  disease) (HCC) Chronic hypoxemic respiratory failure.  -On 7 L home O2 at baseline -Continue trilogy, Tyvaso, pirfenidone, PPI, Singulair -CPAP nightly -followed by [facility]  Pulmonary hypertension ([place]) Continue with treprostinil and verapamil.   Hyperlipidemia LDL goal <70 Continue with statin therapy.   ANXIETY DEPRESSION Continue with fluoxetine and trazodone.   Rheumatoid arthritis ([place]) Continue with oral prednisone, leflunamide-on hold  Obese Calculated BMI is 39,6 consistent with obesity class 2.   Hypokalemia -Replaced  DVT prophylaxis: Lovenox Code Status: Full code Family Communication: Discussed patient detail no family at bedside Disposition Plan: Home likely 3 to 4 days  Consultants: Infectious disease and orthopedics   Procedures:   Antimicrobials:    Objective: Vitals:   04/29/22 1230 04/29/22 1511 04/29/22 1726 04/29/22 2130  BP: 130/72  (!) 140/87 (!) 162/77  Pulse: 85  82 79  Resp: _0 Temp:   98.3 F (36.8 C) 97.9 F (36.6 C)  TempSrc:   Oral Oral  SpO2: 95% 93% 96% 97%  Weight:      Height:        Intake/Output Summary (Last 24 hours) at 04/30/2022 1032 Last data filed at 04/30/2022 0919 Gross per 24 hour  Intake 120 ml  Output 1450 ml  Net -1330 ml   Filed Weights   04/25/22 0400 04/26/22 0400 04/27/22 0426  Weight: 89.9 kg 90.1 kg 88.4 kg    Examination:  General exam: Obese chronically ill female sitting up in bed, AAOx3, no distress HEENT: Positive JVD CVS: S1-S2, regular rhythm Lungs: Decreased breath sounds the bases Abdomen: Soft, nontender, bowel sounds present Extremities: Left lower leg with improving erythema, and warmth, large confluent areas with some purulence and dark discoloration  Psychiatry:  Mood & affect appropriate.     Data Reviewed:   CBC: Recent Labs  Lab 04/24/22 0107 04/25/22 0325 04/26/22 0241 04/28/22 0036 04/29/22 0134  WBC 8.9 6.9 4.8 5.5 5.5  HGB 11.3* 11.0* 11.3*  10.3* 10.2*  HCT 35.1* 35.5* 36.2 32.1* 32.6*  MCV 99.4 101.1* 100.0 99.4 100.9*  PLT 116* 123* 142* 184 128   Basic Metabolic Panel: Recent Labs  Lab 04/25/22 0325 04/27/22 0928 04/28/22 0036 04/29/22 0134 04/30/22 0045  NA 139 140 138 141 139  K 4.4 3.2* 2.8* 4.3 3.0*  CL 100 98 97* 100 99  CO2 36* 31 33* 35* 35*  GLUCOSE 140* 159* 152* 107* 123*  BUN _0 CREATININE 0.91 0.76 0.95 0.79 0.69  CALCIUM 9.8 10.6* 9.6 9.5 9.6   GFR: Estimated Creatinine Clearance: 57.4 mL/min (by C-G formula based on SCr of 0.69 mg/dL). Liver Function Tests: No results for input(s): "AST", "ALT", "ALKPHOS", "BILITOT", "PROT", "ALBUMIN" in the last 168 hours.  No results for input(s): "LIPASE", "AMYLASE" in the last 168 hours. No results for input(s): "AMMONIA" in the last 168 hours. Coagulation Profile: No results for input(s): "INR", "PROTIME" in the last 168 hours. Cardiac Enzymes: Recent Labs  Lab 04/26/22 1844  CKTOTAL 32*   BNP (last 3 results) Recent Labs    12/09/21 1440  PROBNP 61.0   HbA1C: No results for input(s): "HGBA1C" in the last 72 hours. CBG: No results for input(s): "GLUCAP" in the last 168 hours. Lipid Profile: No results for input(s): "CHOL", "HDL", "LDLCALC", "TRIG", "CHOLHDL", "LDLDIRECT" in the last 72 hours. Thyroid Function Tests: No results for input(s): "TSH", "T4TOTAL", "FREET4", "T3FREE", "THYROIDAB" in the last 72 hours. Anemia Panel: No results for input(s): "VITAMINB12", "FOLATE", "FERRITIN", "TIBC", "IRON", "RETICCTPCT" in the last 72 hours. Urine analysis:    Component Value Date/Time   COLORURINE YELLOW 07/31/2013 [place] 07/31/2013 1733   LABSPEC 1.025 07/31/2013 1733   PHURINE 5.5 07/31/2013 1733   GLUCOSEU NEGATIVE 07/31/2013 1733   HGBUR NEGATIVE 07/31/2013 1733   BILIRUBINUR NEGATIVE 07/31/2013 1733   KETONESUR NEGATIVE 07/31/2013 1733   PROTEINUR 30 (A) 07/31/2013 1733   UROBILINOGEN 0.2 07/31/2013  1733   NITRITE NEGATIVE 07/31/2013 1733   LEUKOCYTESUR TRACE (A) 07/31/2013 1733   Sepsis Labs: _1 (procalcitonin:4,lacticidven:4)  )No results found for this or any previous visit (from the past 240 hour(s)).   Radiology Studies: No results found.   Scheduled Meds:  aspirin EC  81 mg Oral Daily   colestipol  1 g Oral Daily   enoxaparin (LOVENOX) injection  40 mg Subcutaneous Q24H   FLUoxetine  20 mg Oral Daily   fluticasone  2 spray Each Nare Daily   fluticasone furoate-vilanterol  1 puff Inhalation Daily   And   umeclidinium bromide  1 puff Inhalation Daily   hydrocortisone cream   Topical BID   ipratropium  1 spray Each Nare BID   loratadine  10 mg Oral Daily   montelukast  10 mg Oral QHS   pantoprazole  40 mg Oral Daily   Pirfenidone  801 mg Oral TID   potassium chloride  40 mEq Oral Q4H   predniSONE  5 mg Oral Q breakfast   rosuvastatin  20 mg Oral Daily   saccharomyces boulardii  250 mg Oral BID   sodium chloride flush  3 mL Intravenous Q12H   traZODone  100 mg Oral QHS   Treprostinil  1 puff Inhalation QID   verapamil  300 mg Oral Daily   Continuous Infusions:  ampicillin-sulbactam (UNASYN) IV 3 g (  04/30/22 0524)   vancomycin 750 mg (04/30/22 1021)     LOS: 8 days    Time spent: 63mn    PDomenic Polite MD [facility]   04/30/2022, 10:32 AM

## 2022-05-01 DIAGNOSIS — L03116 Cellulitis of left lower limb: Secondary | ICD-10-CM | POA: Diagnosis not present

## 2022-05-01 LAB — BASIC METABOLIC PANEL
Anion gap: 6 (ref 5–15)
BUN: 9 mg/dL (ref 8–23)
CO2: 33 mmol/L — ABNORMAL HIGH (ref 22–32)
Calcium: 10.2 mg/dL (ref 8.9–10.3)
Chloride: 100 mmol/L (ref 98–111)
Creatinine, Ser: 0.79 mg/dL (ref 0.44–1.00)
GFR, Estimated: >60 mL/min (ref >60)
Glucose, Bld: 109 mg/dL — ABNORMAL HIGH (ref 70–99)
Potassium: 4.4 mmol/L (ref 3.5–5.1)
Sodium: 139 mmol/L (ref 135–145)

## 2022-05-01 LAB — CBC
HCT: 33 % — ABNORMAL LOW (ref 36.0–46.0)
Hemoglobin: 10.3 g/dL — ABNORMAL LOW (ref 12.0–15.0)
MCH: 31.6 pg (ref 26.0–34.0)
MCHC: 31.2 g/dL (ref 30.0–36.0)
MCV: 101.2 fL — ABNORMAL HIGH (ref 80.0–100.0)
Platelets: 240 10*3/uL (ref 150–400)
RBC: 3.26 MIL/uL — ABNORMAL LOW (ref 3.87–5.11)
RDW: 13.8 % (ref 11.5–15.5)
WBC: 5.2 10*3/uL (ref 4.0–10.5)
nRBC: 0 % (ref 0.0–0.2)

## 2022-05-01 MED ORDER — FUROSEMIDE 40 MG PO TABS
40.0000 mg | ORAL_TABLET | Freq: Every day | ORAL | Status: DC
  Administered 2022-05-01 – 2022-05-03 (×3): 40 mg via ORAL
  Filled 2022-05-01 (×3): qty 1

## 2022-05-01 NOTE — Progress Notes (Signed)
Pharmacy Antibiotic Note  DAMONIQUE BRUNELLE is a 76 y.o. female admitted on 04/20/2022 with worsening leg swelling >> purulence with necrosis.  Pharmacy consulted to dose vancomycin, and Rocephin was broadened to Unasyn per ID.  Scr 0.79--remains stable, afebrile, WBC 5.2--continues to down-trend.  Plan: Continue vanc 751m IV Q24H for AUC 480 (goal 400-550) Unasyn 3gm IV Q6H per MD Monitor renal fxn, clinical progress, vanc AUC soon ID recommends PO doxycycline and augmentin @ discharge   Height: 5' (152.4 cm) Weight: 90.2 kg (198 lb 13.7 oz) IBW/kg (Calculated) : 45.5  Temp (24hrs), Avg:97.8 F (36.6 C), Min:97.8 F (36.6 C), Max:97.8 F (36.6 C)  Recent Labs  Lab 04/25/22 0325 04/26/22 0241 04/27/22 0928 04/28/22 0036 04/29/22 0134 04/30/22 0045 05/01/22 0103  WBC 6.9 4.8  --  5.5 5.5  --  5.2  CREATININE 0.91  --  0.76 0.95 0.79 0.69 0.79     Estimated Creatinine Clearance: 58 mL/min (by C-G formula based on SCr of 0.79 mg/dL).    Allergies  Allergen Reactions   Cefdinir Diarrhea   Erythromycin Base Diarrhea   Lactose Dermatitis   Lactulose Diarrhea   Mesalamine Nausea Only   Methotrexate Other (See Comments)    Felt sick   Nitrofuran Derivatives     shakiness   Other Diarrhea and Other (See Comments)    Shaking uncontrollably "lettuce only" "lettuce only"   Sulfa Antibiotics Other (See Comments)    Patient can't recall reaction    Tdap [Tetanus-Diphth-Acell Pertussis]     Shaking uncontrollably   Tetanus Toxoid, Adsorbed Other (See Comments)    Shaking uncontrollable     Rocephin 10/5 >> 10/10 Vanc 10/9 >>  Unasyn 10/10 >>    SBilley Gosling PharmD PGY1 Pharmacy Resident 10/15/20231:55 PM

## 2022-05-01 NOTE — Progress Notes (Signed)
PROGRESS NOTE    [name]  PRF:[id number] DOB: October 11, 1945 DOA: 04/20/2022 PCP: Lorene Dy, MD  76/F with history of chronic hypoxic respiratory failure on 7 L home O2, interstitial lung disease, rheumatoid arthritis, chronic diastolic CHF presented to the emergency room with dyspnea, lower extremity edema especially left leg including erythema tenderness and subjective fevers. -Chest x-ray noted mild pulmonary vascular congestion, she was admitted started on IV Lasix and antibiotic therapy for cellulitis. -Over the next 2 to 3 days she had worsening left leg changes with areas of black discoloration, vancomycin added, infectious disease was consulted, CT tib-fib obtained, no underlying abscess or fluid collection, changes of cellulitis noted, orthopedics was consulted as well recommended antibiotic therapy   Subjective: -Continues to have diarrhea secondary to antibiotics, leg is largely unchanged  Assessment and Plan:  Cellulitis of left lower extremity -Very slow to improve followed by clinical worsening with some purulence and possible necrotic changes -Immunocompromised on baseline prednisone, leflunomide held in the setting of infection -ID consulting, now on IV vancomycin and Unasyn, very slow improvement -CT negative for abscess, osteomyelitis, upper.  Orthopedics input, medical management recommended -Continue left leg elevation, diuretics -Appreciate ID input, recommended to transition to doxycycline and Augmentin early next week to complete 14-day antibiotic course -Pain control, PT OT -Out of bed to chair  Acute on chronic diastolic CHF Pulmonary hypertension Echo EF 55 to 93%, RV systolic function preserved.  -Diuresed with IV Lasix earlier this admission,  -She is 9.2 L negative, switch to oral Lasix today -poor SGLT2i candidate with limited mobility  Chronic diarrhea -Secondary to IBS and pirfenidone, now worse in the setting of antibiotics -Continue  Imodium  Hypokalemia -Secondary to diarrhea and diuretics, replaced  ILD (interstitial lung disease) (HCC) Chronic hypoxemic respiratory failure.  -On 7 L home O2 at baseline -Continue trilogy, Tyvaso, pirfenidone, PPI, Singulair -CPAP nightly -followed by [facility] -Discussed CODE STATUS with patient  Pulmonary hypertension ([place]) Continue with treprostinil and verapamil.   Hyperlipidemia LDL goal <70 Continue with statin therapy.   ANXIETY DEPRESSION Continue with fluoxetine and trazodone.   Rheumatoid arthritis ([place]) Continue with oral prednisone, leflunamide-on hold  Obese Calculated BMI is 39,6 consistent with obesity class 2.   Hypokalemia -Replaced  DVT prophylaxis: Lovenox Code Status: Full code discussed with patient to reevaluate this Family Communication: Discussed patient detail no family at bedside Disposition Plan: Home likely 2 to 3 days  Consultants: Infectious disease and orthopedics   Procedures:   Antimicrobials:    Objective: Vitals:   04/30/22 1059 04/30/22 1942 05/01/22 0618 05/01/22 0754  BP: (!) 164/84 136/69 (!) 154/76 133/77  Pulse: 89 88 86 82  Resp: _0 Temp: 97.6 F (36.4 C) 97.8 F (36.6 C) 97.8 F (36.6 C) 97.8 F (36.6 C)  TempSrc: Oral Oral Oral Oral  SpO2: 94% 95% 97% 96%  Weight:   90.2 kg   Height:        Intake/Output Summary (Last 24 hours) at 05/01/2022 1023 Last data filed at 05/01/2022 0947 Gross per 24 hour  Intake 980 ml  Output 1475 ml  Net -495 ml   Filed Weights   04/26/22 0400 04/27/22 0426 05/01/22 0618  Weight: 90.1 kg 88.4 kg 90.2 kg    Examination:  General exam: He is chronically ill female sitting up in bed, AAOx3, no distress HEENT: Positive JVD CVS: S1-S2, regular rhythm Lungs: Decreased breath sounds bases Abdomen: Soft, obese, nontender, bowel sounds present Extremities: Left lower leg  with improving erythema, no warmth, large confluent areas with some purulence and dark  discoloration, sloughing skin Psychiatry:  Mood & affect appropriate.     Data Reviewed:   CBC: Recent Labs  Lab 04/25/22 0325 04/26/22 0241 04/28/22 0036 04/29/22 0134 05/01/22 0103  WBC 6.9 4.8 5.5 5.5 5.2  HGB 11.0* 11.3* 10.3* 10.2* 10.3*  HCT 35.5* 36.2 32.1* 32.6* 33.0*  MCV 101.1* 100.0 99.4 100.9* 101.2*  PLT 123* 142* 184 221 476   Basic Metabolic Panel: Recent Labs  Lab 04/27/22 0928 04/28/22 0036 04/29/22 0134 04/30/22 0045 05/01/22 0103  NA 140 138 141 139 139  K 3.2* 2.8* 4.3 3.0* 4.4  CL 98 97* 100 99 100  CO2 31 33* 35* 35* 33*  GLUCOSE 159* 152* 107* 123* 109*  BUN _0 CREATININE 0.76 0.95 0.79 0.69 0.79  CALCIUM 10.6* 9.6 9.5 9.6 10.2   GFR: Estimated Creatinine Clearance: 58 mL/min (by C-G formula based on SCr of 0.79 mg/dL). Liver Function Tests: No results for input(s): "AST", "ALT", "ALKPHOS", "BILITOT", "PROT", "ALBUMIN" in the last 168 hours.  No results for input(s): "LIPASE", "AMYLASE" in the last 168 hours. No results for input(s): "AMMONIA" in the last 168 hours. Coagulation Profile: No results for input(s): "INR", "PROTIME" in the last 168 hours. Cardiac Enzymes: Recent Labs  Lab 04/26/22 1844  CKTOTAL 32*   BNP (last 3 results) Recent Labs    12/09/21 1440  PROBNP 61.0   HbA1C: No results for input(s): "HGBA1C" in the last 72 hours. CBG: No results for input(s): "GLUCAP" in the last 168 hours. Lipid Profile: No results for input(s): "CHOL", "HDL", "LDLCALC", "TRIG", "CHOLHDL", "LDLDIRECT" in the last 72 hours. Thyroid Function Tests: No results for input(s): "TSH", "T4TOTAL", "FREET4", "T3FREE", "THYROIDAB" in the last 72 hours. Anemia Panel: No results for input(s): "VITAMINB12", "FOLATE", "FERRITIN", "TIBC", "IRON", "RETICCTPCT" in the last 72 hours. Urine analysis:    Component Value Date/Time   COLORURINE YELLOW 07/31/2013 [place] 07/31/2013 1733   LABSPEC 1.025 07/31/2013 1733    PHURINE 5.5 07/31/2013 1733   GLUCOSEU NEGATIVE 07/31/2013 1733   HGBUR NEGATIVE 07/31/2013 1733   BILIRUBINUR NEGATIVE 07/31/2013 1733   KETONESUR NEGATIVE 07/31/2013 1733   PROTEINUR 30 (A) 07/31/2013 1733   UROBILINOGEN 0.2 07/31/2013 1733   NITRITE NEGATIVE 07/31/2013 1733   LEUKOCYTESUR TRACE (A) 07/31/2013 1733   Sepsis Labs: _1 (procalcitonin:4,lacticidven:4)  )No results found for this or any previous visit (from the past 240 hour(s)).   Radiology Studies: No results found.   Scheduled Meds:  aspirin EC  81 mg Oral Daily   colestipol  1 g Oral Daily   enoxaparin (LOVENOX) injection  40 mg Subcutaneous Q24H   FLUoxetine  20 mg Oral Daily   fluticasone  2 spray Each Nare Daily   fluticasone furoate-vilanterol  1 puff Inhalation Daily   And   umeclidinium bromide  1 puff Inhalation Daily   furosemide  40 mg Oral Daily   hydrocortisone cream   Topical BID   ipratropium  1 spray Each Nare BID   loratadine  10 mg Oral Daily   montelukast  10 mg Oral QHS   pantoprazole  40 mg Oral Daily   Pirfenidone  801 mg Oral TID   predniSONE  5 mg Oral Q breakfast   rosuvastatin  20 mg Oral Daily   saccharomyces boulardii  250 mg Oral BID   sodium chloride flush  3 mL Intravenous Q12H  traZODone  100 mg Oral QHS   Treprostinil  1 puff Inhalation QID   verapamil  300 mg Oral Daily   Continuous Infusions:  ampicillin-sulbactam (UNASYN) IV 3 g (05/01/22 0644)   vancomycin 750 mg (05/01/22 0947)     LOS: 9 days    Time spent: 81mn    PDomenic Polite MD [facility]   05/01/2022, 10:23 AM

## 2022-05-02 LAB — BASIC METABOLIC PANEL
Anion gap: 4 — ABNORMAL LOW (ref 5–15)
BUN: 9 mg/dL (ref 8–23)
CO2: 34 mmol/L — ABNORMAL HIGH (ref 22–32)
Calcium: 9.9 mg/dL (ref 8.9–10.3)
Chloride: 101 mmol/L (ref 98–111)
Creatinine, Ser: 0.78 mg/dL (ref 0.44–1.00)
GFR, Estimated: >60 mL/min (ref >60)
Glucose, Bld: 165 mg/dL — ABNORMAL HIGH (ref 70–99)
Potassium: 3.4 mmol/L — ABNORMAL LOW (ref 3.5–5.1)
Sodium: 139 mmol/L (ref 135–145)

## 2022-05-02 LAB — CBC
HCT: 32.3 % — ABNORMAL LOW (ref 36.0–46.0)
Hemoglobin: 10.1 g/dL — ABNORMAL LOW (ref 12.0–15.0)
MCH: 31.7 pg (ref 26.0–34.0)
MCHC: 31.3 g/dL (ref 30.0–36.0)
MCV: 101.3 fL — ABNORMAL HIGH (ref 80.0–100.0)
Platelets: 240 10*3/uL (ref 150–400)
RBC: 3.19 MIL/uL — ABNORMAL LOW (ref 3.87–5.11)
RDW: 13.7 % (ref 11.5–15.5)
WBC: 4.9 10*3/uL (ref 4.0–10.5)
nRBC: 0 % (ref 0.0–0.2)

## 2022-05-02 MED ORDER — POTASSIUM CHLORIDE CRYS ER 20 MEQ PO TBCR
40.0000 meq | EXTENDED_RELEASE_TABLET | Freq: Two times a day (BID) | ORAL | Status: AC
  Administered 2022-05-02 (×2): 40 meq via ORAL
  Filled 2022-05-02 (×2): qty 2

## 2022-05-02 MED ORDER — AMOXICILLIN-POT CLAVULANATE 875-125 MG PO TABS
1.0000 | ORAL_TABLET | Freq: Two times a day (BID) | ORAL | Status: DC
  Administered 2022-05-02 – 2022-05-03 (×3): 1 via ORAL
  Filled 2022-05-02 (×3): qty 1

## 2022-05-02 MED ORDER — POTASSIUM CHLORIDE CRYS ER 20 MEQ PO TBCR: 40.0000 meq | EXTENDED_RELEASE_TABLET | Freq: Once | ORAL | Status: DC

## 2022-05-02 MED ORDER — SPIRONOLACTONE 12.5 MG HALF TABLET
12.5000 mg | ORAL_TABLET | Freq: Every day | ORAL | Status: DC
  Administered 2022-05-02 – 2022-05-03 (×2): 12.5 mg via ORAL
  Filled 2022-05-02 (×2): qty 1

## 2022-05-02 MED ORDER — DOXYCYCLINE HYCLATE 100 MG PO TABS
100.0000 mg | ORAL_TABLET | Freq: Two times a day (BID) | ORAL | Status: DC
  Administered 2022-05-02 – 2022-05-03 (×3): 100 mg via ORAL
  Filled 2022-05-02 (×3): qty 1

## 2022-05-02 NOTE — Plan of Care (Signed)
  Problem: Nutrition: Goal: Adequate nutrition will be maintained Outcome: Completed/Met   Problem: Elimination: Goal: Will not experience complications related to urinary retention Outcome: Completed/Met

## 2022-05-02 NOTE — Progress Notes (Signed)
Physical Therapy Treatment Patient Details Name: [name] MRN: [id number] DOB: 1945-07-25 Today's Date: 05/02/2022   History of Present Illness Patient is a 76 yo female presenting to the [facility] ED with BLE swelling and SOB on 04/20/22. Admitted with LLE cellulitis and decompensated heart failure on 04/23/22.  PMH includes: HTN, HFpEF, asthmatic bronchitis, PAH, ILD, chronic respiratory failure on 7 L of oxygen, rheumatoid arthritis on chronic immunosuppression, and OSA    PT Comments    Pt admitted with above diagnosis. Pt was able to ambulate with RW with min guard assist a short distance. Limited today as she was having BM and urinating and spent large part of session cleaning pt. Pt husband present and helping with much of session. He appears used to helping pt at home. Will progress pt as able.  Pt desat to 87% on 8LO2 with activity and >90% at rest.   Pt currently with functional limitations due to balance and endurance deficits. Pt will benefit from skilled PT to increase their independence and safety with mobility to allow discharge to the venue listed below.      Recommendations for follow up therapy are one component of a multi-disciplinary discharge planning process, led by the attending physician.  Recommendations may be updated based on patient status, additional functional criteria and insurance authorization.  Follow Up Recommendations  Home health PT     Assistance Recommended at Discharge Intermittent Supervision/Assistance  Patient can return home with the following A little help with walking and/or transfers;A little help with bathing/dressing/bathroom;Help with stairs or ramp for entrance;Assist for transportation;Assistance with cooking/housework   Equipment Recommendations  None recommended by PT    Recommendations for Other Services       Precautions / Restrictions Precautions Precautions: Fall Precaution Comments: LLE cellulitis Restrictions Weight  Bearing Restrictions: No     Mobility  Bed Mobility               General bed mobility comments: Pt in chair on arrival    Transfers Overall transfer level: Needs assistance Equipment used: Rolling walker (2 wheels) Transfers: Sit to/from Stand Sit to Stand: Min guard           General transfer comment: reminders as pt tries to support on walker to stand. Pt didnt need assist to stand.  Upon standing, pt stated she needed to  urinate.  Pt with purewick hooked up therefore told pt to try to urinate.  Pt then stated she was having a BM as well as urinating (Depends in place).  Obtained 3N1 and pt walked a few steps toward sink to get onto 3N1. Carefully pulled down Depends and pt sat on the 3N1 to finish BM and urination. Cleaned pt and replaced new Depends on pt.  Pt then stood to be cleaned.  Pt asked for hemorrhoid cream however PT is not supposed to put this on therefore informed her that nurse would have to come in and put cream on as well as nurse/NT to replace purewick.    Ambulation/Gait Ambulation/Gait assistance: Min guard Gait Distance (Feet): 16 Feet (8 feet x 2) Assistive device: Rolling walker (2 wheels) Gait Pattern/deviations: Step-through pattern, Decreased stride length, Decreased weight shift to left, Wide base of support Gait velocity: Decreased Gait velocity interpretation: <1.31 ft/sec, indicative of household ambulator   General Gait Details: mild SOB but HR was controlled with 8 L O2, Sats 87-89% with activity and >90% at rest and once recovered.  Pt does well with RW with  activity.  Overall good safety however pt fatigues quickly and given that she stood for at least 4 min x 2 she could not progress distance.  Pt walked back to bed as she has been sitting up a bit.   Stairs             Wheelchair Mobility    Modified Rankin (Stroke Patients Only)       Balance Overall balance assessment: Needs assistance Sitting-balance support: Feet  supported Sitting balance-Leahy Scale: Fair     Standing balance support: Bilateral upper extremity supported, During functional activity Standing balance-Leahy Scale: Poor Standing balance comment: Reliant on UE support and RW.                            Cognition Arousal/Alertness: Awake/alert Behavior During Therapy: WFL for tasks assessed/performed Overall Cognitive Status: Within Functional Limits for tasks assessed Area of Impairment: Memory                     Memory: Decreased short-term memory         General Comments: pt is following instructions with PT        Exercises General Exercises - Lower Extremity Ankle Circles/Pumps: AROM, Both, 10 reps, Seated Long Arc Quad: AROM, Both, 10 reps, Seated    General Comments General comments (skin integrity, edema, etc.): Husband present and interactive with pt and PT.      Pertinent Vitals/Pain Pain Assessment Pain Assessment: Faces Faces Pain Scale: Hurts little more Pain Location: LLE Pain Descriptors / Indicators: Guarding, Grimacing Pain Intervention(s): Limited activity within patient's tolerance, Monitored during session, Repositioned    Home Living                          Prior Function            PT Goals (current goals can now be found in the care plan section) Acute Rehab PT Goals Patient Stated Goal: to go home Progress towards PT goals: Progressing toward goals    Frequency    Min 3X/week      PT Plan Current plan remains appropriate    Co-evaluation              AM-PAC PT "6 Clicks" Mobility   Outcome Measure  Help needed turning from your back to your side while in a flat bed without using bedrails?: A Little Help needed moving from lying on your back to sitting on the side of a flat bed without using bedrails?: A Little Help needed moving to and from a bed to a chair (including a wheelchair)?: A Little Help needed standing up from a chair  using your arms (e.g., wheelchair or bedside chair)?: A Little Help needed to walk in hospital room?: A Little Help needed climbing 3-5 steps with a railing? : A Lot 6 Click Score: 17    End of Session Equipment Utilized During Treatment: Gait belt;Oxygen Activity Tolerance: Patient limited by fatigue Patient left: in bed;with call bell/phone within reach;with family/visitor present;with bed alarm set Nurse Communication: Mobility status (hemorrhoid cream and purewick) PT Visit Diagnosis: Unsteadiness on feet (R26.81);Muscle weakness (generalized) (M62.81);Difficulty in walking, not elsewhere classified (R26.2)     Time: 9409-8286 PT Time Calculation (min) (ACUTE ONLY): 25 min  Charges:  $Gait Training: 8-22 mins $Self Care/Home Management: 8-22  [facility] M,PT Acute Rehab Services [place] 05/02/2022, 2:24 PM

## 2022-05-02 NOTE — Progress Notes (Signed)
PROGRESS NOTE    [name]  JSH:[id number] DOB: March 10, 1946 DOA: 04/20/2022 PCP: Lorene Dy, MD  76/F with history of chronic hypoxic respiratory failure on 7 L home O2, interstitial lung disease, rheumatoid arthritis, chronic diastolic CHF presented to the emergency room with dyspnea, lower extremity edema especially left leg including erythema tenderness and subjective fevers. -Chest x-ray noted mild pulmonary vascular congestion, she was admitted started on IV Lasix and antibiotic therapy for cellulitis. -Over the next 2 to 3 days she had worsening left leg changes with areas of black discoloration, vancomycin added, infectious disease was consulted, CT tib-fib obtained, no underlying abscess or fluid collection, changes of cellulitis noted, orthopedics was consulted as well recommended antibiotic therapy   Subjective: -Continues to have diarrhea secondary to antibiotics, leg is largely unchanged  Assessment and Plan:  Cellulitis of left lower extremity -Very slow to improve followed by clinical worsening with some purulence and possible necrotic changes -Immunocompromised on baseline prednisone, leflunomide held in the setting of infection -ID consulting, now on IV vancomycin and Unasyn day 10, very slow improvement -CT negative for abscess, osteomyelitis, upper.  Orthopedics input, medical management recommended -Continue left leg elevation, diuretics -Appreciate ID input, recommended to transition to doxycycline and Augmentin, will switch to p.o. antibiotics today to complete 14-day antibiotic course -Pain control, PT OT -DC planning  Acute on chronic diastolic CHF Pulmonary hypertension Echo EF 55 to 85%, RV systolic function preserved.  -Diuresed with IV Lasix earlier this admission,  -Clinically euvolemic now she is 9 L negative, continue oral Lasix -Add Aldactone 12.5 Mg -poor SGLT2i candidate with limited mobility  Chronic diarrhea -Secondary to IBS and  pirfenidone, now worse in the setting of antibiotics -Continue Imodium  Hypokalemia -Secondary to diarrhea and diuretics, replaced  ILD (interstitial lung disease) (HCC) Chronic hypoxemic respiratory failure.  -On 7 L home O2 at baseline -Continue trilogy, Tyvaso, pirfenidone, PPI, Singulair -CPAP nightly -followed by [facility] -Discussed CODE STATUS with patient  Pulmonary hypertension ([place]) Continue with treprostinil and verapamil.   Hyperlipidemia LDL goal <70 Continue with statin therapy.   ANXIETY DEPRESSION Continue with fluoxetine and trazodone.   Rheumatoid arthritis ([place]) Continue with oral prednisone, leflunamide-on hold  Obese Calculated BMI is 39,6 consistent with obesity class 2.   Hypokalemia -Replaced  DVT prophylaxis: Lovenox Code Status: Full code discussed with patient to reevaluate this Family Communication: Discussed patient detail no family at bedside Disposition Plan: Home likely tomorrow  Consultants: Infectious disease and orthopedics   Procedures:   Antimicrobials:    Objective: Vitals:   05/02/22 0200 05/02/22 0530 05/02/22 0808 05/02/22 0922  BP:  (!) 148/74  (!) 158/82  Pulse:  77  86  Resp:  18  18  Temp:  98 F (36.7 C)  98.4 F (36.9 C)  TempSrc:  Oral  Oral  SpO2:  96% 96% 98%  Weight: 91 kg     Height:        Intake/Output Summary (Last 24 hours) at 05/02/2022 1103 Last data filed at 05/02/2022 0849 Gross per 24 hour  Intake 678 ml  Output 1501 ml  Net -823 ml   Filed Weights   04/27/22 0426 05/01/22 0618 05/02/22 0200  Weight: 88.4 kg 90.2 kg 91 kg    Examination:  General exam: Chronically ill female sitting up in the recliner, AAOx3, no distress HEENT: No JVD CVS: S1-S2, regular rhythm Lungs: Bilateral Rales Abdomen: Soft, nontender, bowel sounds present Extremities: Left lower leg with improving erythema, no warmth, large  confluent areas with some purulence and dark discoloration, sloughing top  layer of skin Psychiatry:  Mood & affect appropriate.     Data Reviewed:   CBC: Recent Labs  Lab 04/26/22 0241 04/28/22 0036 04/29/22 0134 05/01/22 0103 05/02/22 0036  WBC 4.8 5.5 5.5 5.2 4.9  HGB 11.3* 10.3* 10.2* 10.3* 10.1*  HCT 36.2 32.1* 32.6* 33.0* 32.3*  MCV 100.0 99.4 100.9* 101.2* 101.3*  PLT 142* 184 221 240 750   Basic Metabolic Panel: Recent Labs  Lab 04/28/22 0036 04/29/22 0134 04/30/22 0045 05/01/22 0103 05/02/22 0036  NA 138 141 139 139 139  K 2.8* 4.3 3.0* 4.4 3.4*  CL 97* 100 99 100 101  CO2 33* 35* 35* 33* 34*  GLUCOSE 152* 107* 123* 109* 165*  BUN _0 CREATININE 0.95 0.79 0.69 0.79 0.78  CALCIUM 9.6 9.5 9.6 10.2 9.9   GFR: Estimated Creatinine Clearance: 58.3 mL/min (by C-G formula based on SCr of 0.78 mg/dL). Liver Function Tests: No results for input(s): "AST", "ALT", "ALKPHOS", "BILITOT", "PROT", "ALBUMIN" in the last 168 hours.  No results for input(s): "LIPASE", "AMYLASE" in the last 168 hours. No results for input(s): "AMMONIA" in the last 168 hours. Coagulation Profile: No results for input(s): "INR", "PROTIME" in the last 168 hours. Cardiac Enzymes: Recent Labs  Lab 04/26/22 1844  CKTOTAL 32*   BNP (last 3 results) Recent Labs    12/09/21 1440  PROBNP 61.0   HbA1C: No results for input(s): "HGBA1C" in the last 72 hours. CBG: No results for input(s): "GLUCAP" in the last 168 hours. Lipid Profile: No results for input(s): "CHOL", "HDL", "LDLCALC", "TRIG", "CHOLHDL", "LDLDIRECT" in the last 72 hours. Thyroid Function Tests: No results for input(s): "TSH", "T4TOTAL", "FREET4", "T3FREE", "THYROIDAB" in the last 72 hours. Anemia Panel: No results for input(s): "VITAMINB12", "FOLATE", "FERRITIN", "TIBC", "IRON", "RETICCTPCT" in the last 72 hours. Urine analysis:    Component Value Date/Time   COLORURINE YELLOW 07/31/2013 [place] 07/31/2013 1733   LABSPEC 1.025 07/31/2013 1733   PHURINE 5.5  07/31/2013 1733   GLUCOSEU NEGATIVE 07/31/2013 1733   HGBUR NEGATIVE 07/31/2013 1733   BILIRUBINUR NEGATIVE 07/31/2013 1733   KETONESUR NEGATIVE 07/31/2013 1733   PROTEINUR 30 (A) 07/31/2013 1733   UROBILINOGEN 0.2 07/31/2013 1733   NITRITE NEGATIVE 07/31/2013 1733   LEUKOCYTESUR TRACE (A) 07/31/2013 1733   Sepsis Labs: _1 (procalcitonin:4,lacticidven:4)  )No results found for this or any previous visit (from the past 240 hour(s)).   Radiology Studies: No results found.   Scheduled Meds:  amoxicillin-clavulanate  1 tablet Oral Q12H   aspirin EC  81 mg Oral Daily   colestipol  1 g Oral Daily   doxycycline  100 mg Oral Q12H   enoxaparin (LOVENOX) injection  40 mg Subcutaneous Q24H   FLUoxetine  20 mg Oral Daily   fluticasone  2 spray Each Nare Daily   fluticasone furoate-vilanterol  1 puff Inhalation Daily   And   umeclidinium bromide  1 puff Inhalation Daily   furosemide  40 mg Oral Daily   hydrocortisone cream   Topical BID   ipratropium  1 spray Each Nare BID   loratadine  10 mg Oral Daily   montelukast  10 mg Oral QHS   pantoprazole  40 mg Oral Daily   Pirfenidone  801 mg Oral TID   potassium chloride  40 mEq Oral BID   predniSONE  5 mg Oral Q breakfast   rosuvastatin  20 mg  Oral Daily   saccharomyces boulardii  250 mg Oral BID   sodium chloride flush  3 mL Intravenous Q12H   traZODone  100 mg Oral QHS   Treprostinil  1 puff Inhalation QID   verapamil  300 mg Oral Daily   Continuous Infusions:     LOS: 10 days    Time spent: 12mn    PDomenic Polite MD [facility]   05/02/2022, 11:03 AM

## 2022-05-03 LAB — BASIC METABOLIC PANEL
Anion gap: 4 — ABNORMAL LOW (ref 5–15)
BUN: 10 mg/dL (ref 8–23)
CO2: 32 mmol/L (ref 22–32)
Calcium: 9.4 mg/dL (ref 8.9–10.3)
Chloride: 102 mmol/L (ref 98–111)
Creatinine, Ser: 0.71 mg/dL (ref 0.44–1.00)
GFR, Estimated: >60 mL/min (ref >60)
Glucose, Bld: 139 mg/dL — ABNORMAL HIGH (ref 70–99)
Potassium: 4 mmol/L (ref 3.5–5.1)
Sodium: 138 mmol/L (ref 135–145)

## 2022-05-03 MED ORDER — AMOXICILLIN-POT CLAVULANATE 875-125 MG PO TABS: 1.0000 | ORAL_TABLET | Freq: Two times a day (BID) | ORAL | 0 refills | Status: AC

## 2022-05-03 MED ORDER — MUPIROCIN 2 % EX OINT
TOPICAL_OINTMENT | Freq: Every day | CUTANEOUS | Status: DC
  Filled 2022-05-03: qty 44

## 2022-05-03 MED ORDER — LOPERAMIDE HCL 2 MG PO CAPS: 2.0000 mg | ORAL_CAPSULE | Freq: Two times a day (BID) | ORAL | 0 refills | Status: DC | PRN

## 2022-05-03 MED ORDER — DOXYCYCLINE HYCLATE 100 MG PO TABS: 100.0000 mg | ORAL_TABLET | Freq: Two times a day (BID) | ORAL | 0 refills | Status: AC

## 2022-05-03 MED ORDER — MUPIROCIN 2 % EX OINT: TOPICAL_OINTMENT | Freq: Every day | CUTANEOUS | 0 refills | Status: DC

## 2022-05-03 MED ORDER — SPIRONOLACTONE 25 MG PO TABS: 12.5000 mg | ORAL_TABLET | Freq: Every day | ORAL | 0 refills | Status: DC

## 2022-05-03 MED ORDER — LEFLUNOMIDE 20 MG PO TABS: 20.0000 mg | ORAL_TABLET | Freq: Every day | ORAL | Status: DC

## 2022-05-03 NOTE — Care Management Important Message (Signed)
Important Message  Patient Details  Name: [name] MRN: [id number] Date of Birth: [birth year]   Medicare Important Message Given:  Yes     Shelda Altes 05/03/2022, 11:56 AM

## 2022-05-03 NOTE — Progress Notes (Signed)
Occupational Therapy Treatment Patient Details Name: [name] MRN: [id number] DOB: 01/08/1946 Today's Date: 05/03/2022   History of present illness Patient is a 76 yo female presenting to the [facility] ED with BLE swelling and SOB on 04/20/22. Admitted with LLE cellulitis and decompensated heart failure on 04/23/22.  PMH includes: HTN, HFpEF, asthmatic bronchitis, PAH, ILD, chronic respiratory failure on 7 L of oxygen, rheumatoid arthritis on chronic immunosuppression, and OSA   OT comments  Pt progressing towards goals, able to complete UB and LB dressing with min-mod A from chair level. Pt min guard A for standing for LB dressing and clothing mgmt. Pt needing set up A for seated grooming tasks. Pt presenting with impairments listed below, will follow acutely. Continue to recommend HHOT at d/c.   Recommendations for follow up therapy are one component of a multi-disciplinary discharge planning process, led by the attending physician.  Recommendations may be updated based on patient status, additional functional criteria and insurance authorization.    Follow Up Recommendations  Home health OT    Assistance Recommended at Discharge Intermittent Supervision/Assistance  Patient can return home with the following  A little help with walking and/or transfers;A little help with bathing/dressing/bathroom;Assistance with cooking/housework;Direct supervision/assist for medications management;Direct supervision/assist for financial management;Assist for transportation;Help with stairs or ramp for entrance   Equipment Recommendations  None recommended by OT    Recommendations for Other Services      Precautions / Restrictions Precautions Precautions: Fall Precaution Comments: LLE cellulitis Restrictions Weight Bearing Restrictions: No       Mobility Bed Mobility               General bed mobility comments: Pt in chair on arrival    Transfers Overall transfer level: Needs  assistance Equipment used: Rolling walker (2 wheels) Transfers: Sit to/from Stand Sit to Stand: Min guard                 Balance Overall balance assessment: Needs assistance Sitting-balance support: Feet supported Sitting balance-Leahy Scale: Fair Sitting balance - Comments: can reach towards feet without LOB   Standing balance support: Bilateral upper extremity supported, During functional activity Standing balance-Leahy Scale: Poor Standing balance comment: Reliant on UE support and RW.                           ADL either performed or assessed with clinical judgement   ADL Overall ADL's : Needs assistance/impaired     Grooming: Set up;Sitting           Upper Body Dressing : Sitting;Minimal assistance   Lower Body Dressing: Sitting/lateral leans;Sit to/from stand;Moderate assistance   Toilet Transfer: Minimal assistance;Rolling walker (2 wheels)   Toileting- Clothing Manipulation and Hygiene: Min guard;Sitting/lateral lean;Sit to/from stand       Functional mobility during ADLs: Minimal assistance;Rolling walker (2 wheels)      Extremity/Trunk Assessment Upper Extremity Assessment Upper Extremity Assessment: Generalized weakness   Lower Extremity Assessment Lower Extremity Assessment: Defer to PT evaluation        Vision       Perception Perception Perception: Not tested   Praxis Praxis Praxis: Not tested    Cognition Arousal/Alertness: Awake/alert Behavior During Therapy: WFL for tasks assessed/performed Overall Cognitive Status: Within Functional Limits for tasks assessed  Exercises      Shoulder Instructions       General Comments spouse present during session and helpful    Pertinent Vitals/ Pain       Pain Assessment Pain Assessment: No/denies pain  Home Living                                          Prior Functioning/Environment               Frequency  Min 2X/week        Progress Toward Goals  OT Goals(current goals can now be found in the care plan section)  Progress towards OT goals: Progressing toward goals  Acute Rehab OT Goals Patient Stated Goal: to go home OT Goal Formulation: With patient Time For Goal Achievement: 05/09/22 Potential to Achieve Goals: Good ADL Goals Pt Will Transfer to Toilet: with supervision;ambulating Pt Will Perform Toileting - Clothing Manipulation and hygiene: with supervision;sitting/lateral leans;sit to/from stand Pt/caregiver will Perform Home Exercise Program: With Supervision;With written HEP provided;Increased strength;Both right and left upper extremity Additional ADL Goal #1: Patient will demonstrate increased activity tolerance to complete functional task in standing for 3 minutes prior to needing seated rest break. Additional ADL Goal #2: Patient will be able to demonstrate pursed lip breathing in order to promote increased activity tolerance and decrease anxiety with movement independently.  Plan Discharge plan remains appropriate    Co-evaluation                 AM-PAC OT "6 Clicks" Daily Activity     Outcome Measure   Help from another person eating meals?: None Help from another person taking care of personal grooming?: A Little Help from another person toileting, which includes using toliet, bedpan, or urinal?: A Little Help from another person bathing (including washing, rinsing, drying)?: A Lot Help from another person to put on and taking off regular upper body clothing?: A Little Help from another person to put on and taking off regular lower body clothing?: A Lot 6 Click Score: 17    End of Session Equipment Utilized During Treatment: Rolling walker (2 wheels);Oxygen  OT Visit Diagnosis: Unsteadiness on feet (R26.81);Muscle weakness (generalized) (M62.81);History of falling (Z91.81);Pain Pain - Right/Left: Left Pain - part of body: Leg    Activity Tolerance Patient tolerated treatment well   Patient Left in chair;with call bell/phone within reach;with family/visitor present   Nurse Communication Mobility status        Time: 9784-7841 OT Time Calculation (min): 21 min  Charges: OT General Charges $OT Visit: 1 Visit OT Treatments $Self Care/Home Management : 8-22 mins  Lynnda Child, OTD, OTR/L Acute Rehab [phone] - [place] 05/03/2022, 12:03 PM

## 2022-05-03 NOTE — TOC Transition Note (Signed)
Transition of Care [facility]) - CM/SW Discharge Note   Patient Details  Name: MANASVINI WHATLEY MRN: [id number] Date of Birth: [birth year]  [facility] CM/SW Contact:  Zenon Mayo, RN Phone Number: 05/03/2022, 12:15 PM   Clinical Narrative:    NCM spoke with patient at bedside, offered choice for [facility], HHPT, she states she is active with [facility] and would like to continue.  NCM confirmed with Claiborne Billings at [facility]  Patient's spouse is at the bedside and will transport her home via car.  She is on 7 liters of oxygen with [employer] and she has this in the room with her.     Final next level of care: [place] Barriers to Discharge: No Barriers Identified   Patient Goals and CMS Choice Patient states their goals for this hospitalization and ongoing recovery are:: return home CMS Medicare.gov Compare Post Acute Care list provided to:: Patient Choice offered to / list presented to : Patient  Discharge Placement                       Discharge Plan and Services                  DME Agency: NA       HH Arranged: RN, PT [facility] Agency: [place] Date [facility] Agency Contacted: 05/03/22 Time [place]: 1215 Representative spoke with at [place]: [place] Determinants of Health ([place]) Interventions     Readmission Risk Interventions     No data to display

## 2022-05-03 NOTE — Progress Notes (Signed)
Pt being d/c, VSS, IV removed, Education Complete, home med returned from [place], RN 05/03/2022

## 2022-05-03 NOTE — Plan of Care (Signed)
  Problem: Clinical Measurements: Goal: Will remain free from infection Outcome: Progressing   Problem: Coping: Goal: Level of anxiety will decrease Outcome: Progressing   Problem: Pain Managment: Goal: General experience of comfort will improve Outcome: Progressing   Problem: Safety: Goal: Ability to remain free from injury will improve Outcome: Progressing

## 2022-05-03 NOTE — Consult Note (Signed)
[place] Nurse Consult Note: Patient receiving care in [place]. No family present at time of my visit. Reason for Consult: LLE wound, cellulits Wound type: to be determined by the medical team. Pressure Injury POA: Yes/No/NA Measurement:na Wound bed: There is a large, intact, blood filled bulla along the upper, medial pre-tibial area.  There is a ruptured bulla area along the upper/lateral pre-tibial area.  At the superior edge of this wound, there is a small area of eschar that has developed.  The remainder of the wound is primarily pink, with a small area of darkened tissue at the inferior border as well.  There is surrounding erythema on the LLE, not as pronounced as it shown in the photos from 04/26/22.  There is no dressing on the leg today. Drainage (amount, consistency, odor) none Periwound: intact, thin, fragile skin Dressing procedure/placement/frequency: For in hospital care: was the LLE shin area with sterile water or no rinse spray. For home, patient should wash with dial soap. Gently pat dry. Apply Mupirocin over the open wound areas. Cover with Xeroform gauze and secure with kerlix. NO tape to be applied on the skin. To remove the old dressing without causing trauma, liberally moisten the old dressing with water.  The patient cannot perform care to her leg, but tells me her husband can and is willing to do so.  I provided her with 9 Xeroform gauzes and 3 rolls of kerlix for use at home.  She verbalized her understanding of the care to be provided to the leg.  She tells me she is being discharged this afternoon home.   Thank you for the consult.  Discussed plan of care with the patient.  [place] nurse will not follow at this time.  Please re-consult the [place] team if needed.  Val Riles, RN, MSN, CWOCN, CNS-BC, pager [phone]

## 2022-05-03 NOTE — Discharge Summary (Signed)
Physician Discharge Summary  [name] XBW:[id number] DOB: April 19, 1946 DOA: 04/20/2022  PCP: Lorene Dy, MD  Admit date: 04/20/2022 Discharge date: 05/03/2022  Time spent: 35 minutes  Recommendations for Outpatient Follow-up:  Infectious disease Dr. West Bali in 43 to 48 days Cardiology Dr. Haroldine Laws in 1 month Pulmonary [name] in 2 to 3 weeks, recommend goals of care and CODE STATUS discussions Resume leflunomide in 2 weeks  Discharge Diagnoses:  Principal Problem:   Cellulitis of left lower extremity Acute on chronic respiratory failure Chronic respiratory failure on 7L home O2   Acute on chronic diastolic CHF (congestive heart failure) (HCC)   ILD (interstitial lung disease) ([place])   Pulmonary hypertension ([place])   Hyperlipidemia LDL goal <70   ANXIETY DEPRESSION   Obstructive sleep apnea   Rheumatoid arthritis ([place])   Obese   Hypokalemia   Discharge Condition: Improved  Diet recommendation: Low-sodium, heart healthy  Filed Weights   05/01/22 0618 05/02/22 0200 05/03/22 0253  Weight: 90.2 kg 91 kg 90.8 kg    History of present illness:  76/F with history of chronic hypoxic respiratory failure on 7 L home O2, interstitial lung disease, rheumatoid arthritis, chronic diastolic CHF presented to the emergency room with dyspnea, lower extremity edema especially left leg including erythema tenderness and subjective fevers. -Chest x-ray noted mild pulmonary vascular congestion, she was admitted started on IV Lasix and antibiotic therapy for cellulitis. -Over the next 2 to 3 days she had worsening left leg changes with areas of black discoloration, vancomycin added, infectious disease was consulted, CT tib-fib obtained, no underlying abscess or fluid collection, changes of cellulitis noted, orthopedics was consulted as well recommended antibiotic therapy  Hospital Course:   Cellulitis of left lower extremity -Very slow to improve followed by clinical worsening  with some purulence and possible necrotic changes -Immunocompromised on baseline prednisone, leflunomide held in the setting of infection -ID consulting, treated with 8 days of IV vancomycin and Unasyn, very slow mild improvement -CT negative for abscess, osteomyelitis, upper.  Orthopedics input, medical management recommended -Continue left leg elevation, diuretics -Seen by infectious disease in consultation, recommended to transition to doxycycline and Augmentin, will switch to p.o. antibiotics today to complete 14-day antibiotic course -Discharged home today on oral antibiotics, sent message to infectious disease for follow-up -PCP in 1 week   Acute on chronic diastolic CHF Pulmonary hypertension Echo EF 55 to 16%, RV systolic function preserved.  -Diuresed with IV Lasix earlier this admission,  -Clinically euvolemic now she is 10.4 L negative, continue oral Lasix -Started low-dose Aldactone 12.5 Mg -poor SGLT2i candidate with limited mobility   Chronic diarrhea -Secondary to IBS and pirfenidone, now worse in the setting of antibiotics -Continue Imodium   Hypokalemia -Secondary to diarrhea and diuretics, replaced   ILD (interstitial lung disease) (HCC) Chronic hypoxemic respiratory failure.  -On 7 L home O2 at baseline -Continue trilogy, Tyvaso, pirfenidone, PPI, Singulair -CPAP nightly -followed by [facility] -Discussed CODE STATUS with patient, will need further discussions   Pulmonary hypertension ([place]) Continue with treprostinil and verapamil.    Hyperlipidemia LDL goal <70 Continue with statin therapy.    ANXIETY DEPRESSION Continue with fluoxetine and trazodone.    Rheumatoid arthritis ([place]) Continue with oral prednisone, leflunamide-on hold   Obese Calculated BMI is 39,6 consistent with obesity class 2.    Hypokalemia -Replaced  Discharge Exam: Vitals:   05/03/22 0830 05/03/22 1211  BP: 139/65   Pulse: 82   Resp: 18   Temp: 97.7 F (36.5 C)  SpO2: 95% 95%   General exam: Chronically ill female sitting up in the recliner, AAOx3, no distress HEENT: No JVD CVS: S1-S2, regular rhythm Lungs: Bilateral Rales Abdomen: Soft, nontender, bowel sounds present Extremities: Left lower leg with improving erythema, no warmth, large confluent areas with some purulence and dark discoloration, sloughing top layer of skin Psychiatry:  Mood & affect appropriate.     Discharge Instructions   Discharge Instructions     Diet - low sodium heart healthy   Complete by: As directed    Increase activity slowly   Complete by: As directed       Allergies as of 05/03/2022       Reactions   Cefdinir Diarrhea   Erythromycin Base Diarrhea   Lactose Dermatitis   Lactulose Diarrhea   Mesalamine Nausea Only   Methotrexate Other (See Comments)   Felt sick   Nitrofuran Derivatives    shakiness   Other Diarrhea, Other (See Comments)   Shaking uncontrollably "lettuce only" "lettuce only"   Sulfa Antibiotics Other (See Comments)   Patient can't recall reaction   Tdap [tetanus-diphth-acell Pertussis]    Shaking uncontrollably   Tetanus Toxoid, Adsorbed Other (See Comments)   Shaking uncontrollable         Medication List     STOP taking these medications    celecoxib 200 MG capsule Commonly known as: CELEBREX   Oxycodone HCl 10 MG Tabs       TAKE these medications    ACTEMRA IV Inject into the vein every 30 (thirty) days. infusion   albuterol 108 (90 Base) MCG/ACT inhaler Commonly known as: VENTOLIN HFA USE 2 INHALATIONS BY MOUTH  EVERY 6 HOURS AS NEEDED What changed: See the new instructions.   amoxicillin-clavulanate 875-125 MG tablet Commonly known as: AUGMENTIN Take 1 tablet by mouth every 12 (twelve) hours for 5 days.   aspirin EC 81 MG tablet Take 81 mg by mouth daily.   benzonatate 200 MG capsule Commonly known as: TESSALON TAKE 1 CAPSULE (200 MG TOTAL) BY MOUTH 3 (THREE) TIMES DAILY AS NEEDED FOR COUGH.    Biofreeze Cool The Pain 4 % Gel Generic drug: Menthol (Topical Analgesic) Apply 1 Application topically 2 (two) times daily as needed (pain).   cetirizine 10 MG tablet Commonly known as: ZYRTEC Take 20 mg by mouth daily.   clidinium-chlordiazePOXIDE 5-2.5 MG capsule Commonly known as: LIBRAX Take 1 capsule by mouth 3 (three) times daily as needed (for IBS).   colestipol 1 g tablet Commonly known as: COLESTID Take 1 g by mouth daily.   dicyclomine 20 MG tablet Commonly known as: BENTYL Take 20 mg by mouth 4 (four) times daily as needed (IBS).   doxycycline 100 MG tablet Commonly known as: VIBRA-TABS Take 1 tablet (100 mg total) by mouth every 12 (twelve) hours for 5 days.   FLUoxetine 20 MG capsule Commonly known as: PROZAC Take 20 mg by mouth daily.   fluticasone 50 MCG/ACT nasal spray Commonly known as: FLONASE SPRAY 2 SPRAYS INTO EACH NOSTRIL EVERY DAY What changed: See the new instructions.   furosemide 40 MG tablet Commonly known as: LASIX TAKE 1 TABLET BY MOUTH DAILY   ipratropium 0.03 % nasal spray Commonly known as: ATROVENT Use 1-2 sprays in both nostrils twice daily.   ipratropium-albuterol 0.5-2.5 (3) MG/3ML Soln Commonly known as: DUONEB USE 1 VIAL IN NEBULIZER EVERY 6 HOURS AS NEEDED J45.901 What changed:  how much to take how to take this when to take this reasons  to take this additional instructions   leflunomide 20 MG tablet Commonly known as: ARAVA Take 1 tablet (20 mg total) by mouth daily. Start taking on: May 17, 2022 What changed: These instructions start on May 17, 2022. If you are unsure what to do until then, ask your doctor or other care provider.   loperamide 2 MG capsule Commonly known as: IMODIUM Take 1 capsule (2 mg total) by mouth 2 (two) times daily as needed for diarrhea or loose stools.   methocarbamol 750 MG tablet Commonly known as: ROBAXIN Take 750 mg by mouth 2 (two) times daily as needed for muscle spasms.    montelukast 10 MG tablet Commonly known as: SINGULAIR Take 1 tablet (10 mg total) by mouth at bedtime.   mupirocin ointment 2 % Commonly known as: BACTROBAN Apply topically daily.   omeprazole 40 MG capsule Commonly known as: PRILOSEC Take 40 mg by mouth at bedtime.   OXYGEN Inhale 7 L into the lungs continuous.   Pirfenidone 801 MG Tabs Commonly known as: Esbriet Take 801 mg by mouth 3 (three) times daily.   potassium chloride SA 20 MEQ tablet Commonly known as: Klor-Con M20 Take 1 tablet (20 mEq total) by mouth 2 (two) times daily.   predniSONE 5 MG tablet Commonly known as: DELTASONE Take 5 mg by mouth daily with breakfast.   rosuvastatin 20 MG tablet Commonly known as: CRESTOR Take 1 tablet (20 mg total) by mouth daily.   spironolactone 25 MG tablet Commonly known as: ALDACTONE Take 0.5 tablets (12.5 mg total) by mouth daily. Start taking on: May 04, 2022   Systane 0.4-0.3 % Gel ophthalmic gel Generic drug: Polyethyl Glycol-Propyl Glycol Place 2 application  into both eyes 2 (two) times daily as needed (dry eyes).   traMADol 50 MG tablet Commonly known as: ULTRAM Take 50 mg by mouth 3 (three) times daily as needed for moderate pain.   traZODone 100 MG tablet Commonly known as: DESYREL Take 100 mg by mouth at bedtime.   Trelegy Ellipta 100-62.5-25 MCG/ACT Aepb Generic drug: Fluticasone-Umeclidin-Vilant USE 1 INHALATION BY MOUTH DAILY What changed: See the new instructions.   Tyvaso DPI Maintenance Kit 64 MCG Powd Generic drug: Treprostinil Inhale 1 puff into the lungs in the morning, at noon, in the evening, and at bedtime.   Verapamil HCl CR 300 MG Cp24 Take 1 capsule (300 mg total) by mouth daily.   Vitamin D3 250 MCG (10000 UT) capsule Take 10,000 Units by mouth daily.       Allergies  Allergen Reactions   Cefdinir Diarrhea   Erythromycin Base Diarrhea   Lactose Dermatitis   Lactulose Diarrhea   Mesalamine Nausea Only   Methotrexate  Other (See Comments)    Felt sick   Nitrofuran Derivatives     shakiness   Other Diarrhea and Other (See Comments)    Shaking uncontrollably "lettuce only" "lettuce only"   Sulfa Antibiotics Other (See Comments)    Patient can't recall reaction    Tdap [Tetanus-Diphth-Acell Pertussis]     Shaking uncontrollably   Tetanus Toxoid, Adsorbed Other (See Comments)    Shaking uncontrollable     Follow-up Information     Lorene Dy, MD. Go on 05/23/2022.   Specialty: Internal Medicine Why: _0 :15pm Contact information: [place], [place] [address] [phone]         [facility] Follow up.   Specialty: Home Health Services Why: Someone will call you to schedule first home visit. Contact information:  [address] [place] [address] [phone]         Bensimhon, Shaune Pascal, MD. Schedule an appointment as soon as possible for a visit in 1 month(s).   Specialty: Cardiology Contact information: [address] [place] [address] [phone]                  The results of significant diagnostics from this hospitalization (including imaging, microbiology, ancillary and laboratory) are listed below for reference.    Significant Diagnostic Studies: CT TIBIA FIBULA LEFT W CONTRAST  Result Date: 04/26/2022 CLINICAL DATA:  Thigh, calf, and ankle cellulitis. Worsening redness. Evaluate for underlying deep infection or abscess. EXAM: CT OF THE LOWER LEFT EXTREMITY WITH CONTRAST TECHNIQUE: Multidetector CT imaging of the lower left extremity was performed according to the standard protocol following intravenous contrast administration. RADIATION DOSE REDUCTION: This exam was performed according to the departmental dose-optimization program which includes automated exposure control, adjustment of the mA and/or kV according to patient size and/or use of iterative reconstruction technique. CONTRAST:  30m OMNIPAQUE  IOHEXOL 350 MG/ML SOLN COMPARISON:  Left knee radiographs 09/22/2012 FINDINGS: Left femur: Bones/Joint/Cartilage There is diffuse decreased bone mineralization. Moderate joint space narrowing and peripheral osteophytosis of the pubic symphysis. Moderate superior left femoroacetabular joint space narrowing. Mild superior lateral left acetabular degenerative osteophytosis. Within the limitations of diffuse decreased bone mineralization, no acute fracture is seen. No definite cortical erosion is seen. Left sacroiliac joint partially visualized subchondral sclerosis and vacuum phenomenon. Within the limitations of metallic streak artifact from total left knee arthroplasty, no definite perihardware lucency is seen to indicate hardware failure or loosening. Ligaments Suboptimally assessed by CT. Muscles and Tendons There is mild-to-moderate fatty infiltration of the left semimembranosus muscle in the long head of the rectus femoris muscle with possible minimal muscle atrophy. No gross tendon tear is visualized. Soft tissues There is mild soft tissue swelling and moderate edema/fat stranding seen throughout the left calf, greatest within the anterolateral proximal, mid, and distal left thigh and next greatest within the posteromedial mid to distal left thigh. No walled-off fluid collection is seen. Moderate to high-grade atherosclerotic calcifications. There is minimal partial visualization of a high-grade sigmoid and descending colon diverticulosis. -- Left tibia and fibula: Bones/Joint/Cartilage Within the limitations of metallic streak artifact, no perihardware lucency is seen around the total left knee arthroplasty hardware to indicate hardware failure or loosening. Additional anterolateral distal tibial plate and screw fixation hardware without definite hardware complication. No acute fracture or cortical erosion is seen. Ligaments Suboptimally assessed by CT. Muscles and Tendons There is diffuse fatty infiltration  seen throughout the anterior, lateral, deep posterior, and mid to distal aspect of the superficial posterior muscle compartments of the left calf. Soft tissues There is moderate swelling and edema/stranding seen throughout the left calf subcutaneous fat diffusely. No walled-off abscess is visualized. There are moderate to high-grade atherosclerotic calcifications. -- Left foot: Bones/Joint/Cartilage There is distal anterior tibial plate and screw fixation hardware that is also involves screws traversing the talar neck. 2/3 of the screws are fractured (sagittal series 22 images 47 through 49). There are additional screws traversing the medial cuneiform through the anterior process of the calcaneus and lateral cuneiform through the anterior process of the calcaneus. Old screw tracts from prior remote posterior subtalar arthrodesis screws. There is arthrodesis of the posterior, medial, and anterior subtalar joints, talonavicular joint, and the calcaneocuboid joint. High-grade joint space narrowing throughout the tarsometatarsal joints and the  tibiotalar joint without osseous fusion. Additional screws are visualized within the second through fourth toes, spanning the PIP and DIP joints. No definite cortical erosion. Ligaments Suboptimally assessed by CT. Muscles and Tendons High-grade diffuse muscle atrophy. Soft tissues Mild edema and swelling of the anterior, medial, and lateral ankle and dorsal midfoot subcutaneous fat diffusely. No walled-off fluid collection is seen. IMPRESSION: 1. No definite cortical erosion within the left femur, tibia/fibula, or left foot. 2. Diffuse left lower extremity subcutaneous fat edema and swelling compatible with cellulitis. No walled-off fluid collection is seen to indicate abscess. 3. 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 No evidence of hardware failure outside of remote fracturing of two screws within the distal aspect of the distal  tibia-midfoot plate and screw fusion hardware. Electronically Signed   By: [name] M.D.   On: 04/26/2022 15:30   CT FOOT LEFT W CONTRAST  Result Date: 04/26/2022 CLINICAL DATA:  Thigh, calf, and ankle cellulitis. Worsening redness. Evaluate for underlying deep infection or abscess. EXAM: CT OF THE LOWER LEFT EXTREMITY WITH CONTRAST TECHNIQUE: Multidetector CT imaging of the lower left extremity was performed according to the standard protocol following intravenous contrast administration. RADIATION DOSE REDUCTION: This exam was performed according to the departmental dose-optimization program which includes automated exposure control, adjustment of the mA and/or kV according to patient size and/or use of iterative reconstruction technique. CONTRAST:  17m OMNIPAQUE IOHEXOL 350 MG/ML SOLN COMPARISON:  Left knee radiographs 09/22/2012 FINDINGS: Left femur: Bones/Joint/Cartilage There is diffuse decreased bone mineralization. Moderate joint space narrowing and peripheral osteophytosis of the pubic symphysis. Moderate superior left femoroacetabular joint space narrowing. Mild superior lateral left acetabular degenerative osteophytosis. Within the limitations of diffuse decreased bone mineralization, no acute fracture is seen. No definite cortical erosion is seen. Left sacroiliac joint partially visualized subchondral sclerosis and vacuum phenomenon. Within the limitations of metallic streak artifact from total left knee arthroplasty, no definite perihardware lucency is seen to indicate hardware failure or loosening. Ligaments Suboptimally assessed by CT. Muscles and Tendons There is mild-to-moderate fatty infiltration of the left semimembranosus muscle in the long head of the rectus femoris muscle with possible minimal muscle atrophy. No gross tendon tear is visualized. Soft tissues There is mild soft tissue swelling and moderate edema/fat stranding seen throughout the left calf, greatest within the  anterolateral proximal, mid, and distal left thigh and next greatest within the posteromedial mid to distal left thigh. No walled-off fluid collection is seen. Moderate to high-grade atherosclerotic calcifications. There is minimal partial visualization of a high-grade sigmoid and descending colon diverticulosis. -- Left tibia and fibula: Bones/Joint/Cartilage Within the limitations of metallic streak artifact, no perihardware lucency is seen around the total left knee arthroplasty hardware to indicate hardware failure or loosening. Additional anterolateral distal tibial plate and screw fixation hardware without definite hardware complication. No acute fracture or cortical erosion is seen. Ligaments Suboptimally assessed by CT. Muscles and Tendons There is diffuse fatty infiltration seen throughout the anterior, lateral, deep posterior, and mid to distal aspect of the superficial posterior muscle compartments of the left calf. Soft tissues There is moderate swelling and edema/stranding seen throughout the left calf subcutaneous fat diffusely. No walled-off abscess is visualized. There are moderate to high-grade atherosclerotic calcifications. -- Left foot: Bones/Joint/Cartilage There is distal anterior tibial plate and screw fixation hardware that is also involves screws traversing the talar neck. 2/3 of the screws are fractured (sagittal series 22 images 47 through 49). There are additional screws traversing  the medial cuneiform through the anterior process of the calcaneus and lateral cuneiform through the anterior process of the calcaneus. Old screw tracts from prior remote posterior subtalar arthrodesis screws. There is arthrodesis of the posterior, medial, and anterior subtalar joints, talonavicular joint, and the calcaneocuboid joint. High-grade joint space narrowing throughout the tarsometatarsal joints and the tibiotalar joint without osseous fusion. Additional screws are visualized within the second through  fourth toes, spanning the PIP and DIP joints. No definite cortical erosion. Ligaments Suboptimally assessed by CT. Muscles and Tendons High-grade diffuse muscle atrophy. Soft tissues Mild edema and swelling of the anterior, medial, and lateral ankle and dorsal midfoot subcutaneous fat diffusely. No walled-off fluid collection is seen. IMPRESSION: 1. No definite cortical erosion within the left femur, tibia/fibula, or left foot. 2. Diffuse left lower extremity subcutaneous fat edema and swelling compatible with cellulitis. No walled-off fluid collection is seen to indicate abscess. 3. 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 No evidence of hardware failure outside of remote fracturing of two screws within the distal aspect of the distal tibia-midfoot plate and screw fusion hardware. Electronically Signed   By: [name] M.D.   On: 04/26/2022 15:30   CT FEMUR LEFT W CONTRAST  Result Date: 04/26/2022 CLINICAL DATA:  Thigh, calf, and ankle cellulitis. Worsening redness. Evaluate for underlying deep infection or abscess. EXAM: CT OF THE LOWER LEFT EXTREMITY WITH CONTRAST TECHNIQUE: Multidetector CT imaging of the lower left extremity was performed according to the standard protocol following intravenous contrast administration. RADIATION DOSE REDUCTION: This exam was performed according to the departmental dose-optimization program which includes automated exposure control, adjustment of the mA and/or kV according to patient size and/or use of iterative reconstruction technique. CONTRAST:  16m OMNIPAQUE IOHEXOL 350 MG/ML SOLN COMPARISON:  Left knee radiographs 09/22/2012 FINDINGS: Left femur: Bones/Joint/Cartilage There is diffuse decreased bone mineralization. Moderate joint space narrowing and peripheral osteophytosis of the pubic symphysis. Moderate superior left femoroacetabular joint space narrowing. Mild superior lateral left acetabular degenerative osteophytosis.  Within the limitations of diffuse decreased bone mineralization, no acute fracture is seen. No definite cortical erosion is seen. Left sacroiliac joint partially visualized subchondral sclerosis and vacuum phenomenon. Within the limitations of metallic streak artifact from total left knee arthroplasty, no definite perihardware lucency is seen to indicate hardware failure or loosening. Ligaments Suboptimally assessed by CT. Muscles and Tendons There is mild-to-moderate fatty infiltration of the left semimembranosus muscle in the long head of the rectus femoris muscle with possible minimal muscle atrophy. No gross tendon tear is visualized. Soft tissues There is mild soft tissue swelling and moderate edema/fat stranding seen throughout the left calf, greatest within the anterolateral proximal, mid, and distal left thigh and next greatest within the posteromedial mid to distal left thigh. No walled-off fluid collection is seen. Moderate to high-grade atherosclerotic calcifications. There is minimal partial visualization of a high-grade sigmoid and descending colon diverticulosis. -- Left tibia and fibula: Bones/Joint/Cartilage Within the limitations of metallic streak artifact, no perihardware lucency is seen around the total left knee arthroplasty hardware to indicate hardware failure or loosening. Additional anterolateral distal tibial plate and screw fixation hardware without definite hardware complication. No acute fracture or cortical erosion is seen. Ligaments Suboptimally assessed by CT. Muscles and Tendons There is diffuse fatty infiltration seen throughout the anterior, lateral, deep posterior, and mid to distal aspect of the superficial posterior muscle compartments of the left calf. Soft tissues There is moderate swelling and edema/stranding seen throughout the left  calf subcutaneous fat diffusely. No walled-off abscess is visualized. There are moderate to high-grade atherosclerotic calcifications. -- Left  foot: Bones/Joint/Cartilage There is distal anterior tibial plate and screw fixation hardware that is also involves screws traversing the talar neck. 2/3 of the screws are fractured (sagittal series 22 images 47 through 49). There are additional screws traversing the medial cuneiform through the anterior process of the calcaneus and lateral cuneiform through the anterior process of the calcaneus. Old screw tracts from prior remote posterior subtalar arthrodesis screws. There is arthrodesis of the posterior, medial, and anterior subtalar joints, talonavicular joint, and the calcaneocuboid joint. High-grade joint space narrowing throughout the tarsometatarsal joints and the tibiotalar joint without osseous fusion. Additional screws are visualized within the second through fourth toes, spanning the PIP and DIP joints. No definite cortical erosion. Ligaments Suboptimally assessed by CT. Muscles and Tendons High-grade diffuse muscle atrophy. Soft tissues Mild edema and swelling of the anterior, medial, and lateral ankle and dorsal midfoot subcutaneous fat diffusely. No walled-off fluid collection is seen. IMPRESSION: 1. No definite cortical erosion within the left femur, tibia/fibula, or left foot. 2. Diffuse left lower extremity subcutaneous fat edema and swelling compatible with cellulitis. No walled-off fluid collection is seen to indicate abscess. 3. 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 No evidence of hardware failure outside of remote fracturing of two screws within the distal aspect of the distal tibia-midfoot plate and screw fusion hardware. Electronically Signed   By: [name] M.D.   On: 04/26/2022 15:30   VAS [place] LOWER EXTREMITY VENOUS (DVT)  Result Date: 04/21/2022  Lower Venous DVT Study Patient Name:  [name]  Date of Exam:   04/21/2022 Medical Rec #: [id number]       Accession #:    [id number] Date of Birth: 19-Aug-1945       Patient Gender: F Patient  Age:   68 years Exam Location:  [facility] Procedure:      VAS [place] LOWER EXTREMITY VENOUS (DVT) Referring Phys: Fuller Plan --------------------------------------------------------------------------------  Indications: Pain, and Edema.  Limitations: Body habitus and edema, pain with compression. Comparison Study: Prior negative left LEV done 04/07/16 Performing Technologist: Sharion Dove RVS  Examination Guidelines: A complete evaluation includes B-mode imaging, spectral Doppler, color Doppler, and power Doppler as needed of all accessible portions of each vessel. Bilateral testing is considered an integral part of a complete examination. Limited examinations for reoccurring indications may be performed as noted. The reflux portion of the exam is performed with the patient in reverse Trendelenburg.  +---------+---------------+---------+-----------+----------+-------------------+ RIGHT    CompressibilityPhasicitySpontaneityPropertiesThrombus Aging      +---------+---------------+---------+-----------+----------+-------------------+ CFV      Full           Yes      Yes                                      +---------+---------------+---------+-----------+----------+-------------------+ SFJ      Full                                                             +---------+---------------+---------+-----------+----------+-------------------+ FV Prox  Full                                                             +---------+---------------+---------+-----------+----------+-------------------+  FV Mid                  Yes      Yes                  patent by color and                                                       Doppler             +---------+---------------+---------+-----------+----------+-------------------+ FV Distal                                             patent by color and                                                       Doppler              +---------+---------------+---------+-----------+----------+-------------------+ PFV      Full                                                             +---------+---------------+---------+-----------+----------+-------------------+ POP      Full           No       Yes                                      +---------+---------------+---------+-----------+----------+-------------------+ PTV      Full                                                             +---------+---------------+---------+-----------+----------+-------------------+ PERO                                                  Not well visualized +---------+---------------+---------+-----------+----------+-------------------+   +---------+---------------+---------+-----------+----------+-------------------+ LEFT     CompressibilityPhasicitySpontaneityPropertiesThrombus Aging      +---------+---------------+---------+-----------+----------+-------------------+ CFV      Full           Yes      Yes                                      +---------+---------------+---------+-----------+----------+-------------------+ SFJ      Full                                                             +---------+---------------+---------+-----------+----------+-------------------+  FV Prox  Full           Yes      Yes                                      +---------+---------------+---------+-----------+----------+-------------------+ FV Mid                  Yes      Yes                  patent by color and                                                       Doppler             +---------+---------------+---------+-----------+----------+-------------------+ FV Distal               No       Yes                  patent by color and                                                       Doppler              +---------+---------------+---------+-----------+----------+-------------------+ PFV      Full                                                             +---------+---------------+---------+-----------+----------+-------------------+ POP                     Yes      Yes                  patent by color and                                                       Doppler             +---------+---------------+---------+-----------+----------+-------------------+ PTV      Full           Yes      Yes                                      +---------+---------------+---------+-----------+----------+-------------------+ PERO                                                  Not well visualized +---------+---------------+---------+-----------+----------+-------------------+     Summary: RIGHT: - There is no evidence  of deep vein thrombosis in the lower extremity. However, portions of this examination were limited- see technologist comments above.  - No cystic structure found in the popliteal fossa.  LEFT: - There is no evidence of deep vein thrombosis in the lower extremity. However, portions of this examination were limited- see technologist comments above.  - No cystic structure found in the popliteal fossa.  *See table(s) above for measurements and observations. Electronically signed by Jamelle Haring on 04/21/2022 at 4:42:36 PM.    Final    DG Chest Port 1 View  Result Date: 04/20/2022 CLINICAL DATA:  Shortness of breath EXAM: PORTABLE CHEST 1 VIEW COMPARISON:  CT chest dated 05/14/2021 FINDINGS: Scarring in the right perihilar region and lingula. Left lung base is obscured, likely due to prominent epicardial fat when correlating with CT. No definite pleural effusions or pneumothorax. The heart is top-normal in size. IMPRESSION: No evidence of acute cardiopulmonary disease. Electronically Signed   By: Julian Hy M.D.   On: 04/20/2022 19:46    Microbiology: No results found  for this or any previous visit (from the past 240 hour(s)).   Labs: Basic Metabolic Panel: Recent Labs  Lab 04/29/22 0134 04/30/22 0045 05/01/22 0103 05/02/22 0036 05/03/22 0043  NA 141 139 139 139 138  K 4.3 3.0* 4.4 3.4* 4.0  CL 100 99 100 101 102  CO2 35* 35* 33* 34* 32  GLUCOSE 107* 123* 109* 165* 139*  BUN _0 CREATININE 0.79 0.69 0.79 0.78 0.71  CALCIUM 9.5 9.6 10.2 9.9 9.4   Liver Function Tests: No results for input(s): "AST", "ALT", "ALKPHOS", "BILITOT", "PROT", "ALBUMIN" in the last 168 hours. No results for input(s): "LIPASE", "AMYLASE" in the last 168 hours. No results for input(s): "AMMONIA" in the last 168 hours. CBC: Recent Labs  Lab 04/28/22 0036 04/29/22 0134 05/01/22 0103 05/02/22 0036  WBC 5.5 5.5 5.2 4.9  HGB 10.3* 10.2* 10.3* 10.1*  HCT 32.1* 32.6* 33.0* 32.3*  MCV 99.4 100.9* 101.2* 101.3*  PLT 184 221 240 240   Cardiac Enzymes: Recent Labs  Lab 04/26/22 1844  CKTOTAL 32*   BNP: BNP (last 3 results) Recent Labs    04/20/22 1820  BNP 36.0    ProBNP (last 3 results) Recent Labs    12/09/21 1440  PROBNP 61.0    CBG: No results for input(s): "GLUCAP" in the last 168 hours.     Signed:  Domenic Polite MD.  [facility] 05/03/2022, 12:17 PM

## 2022-05-09 ENCOUNTER — Ambulatory Visit (HOSPITAL_COMMUNITY): Payer: Medicare Other

## 2022-05-10 ENCOUNTER — Other Ambulatory Visit: Payer: Self-pay | Admitting: Internal Medicine

## 2022-05-10 DIAGNOSIS — I2510 Atherosclerotic heart disease of native coronary artery without angina pectoris: Secondary | ICD-10-CM

## 2022-05-16 ENCOUNTER — Other Ambulatory Visit: Payer: Self-pay | Admitting: Internal Medicine

## 2022-05-17 ENCOUNTER — Other Ambulatory Visit: Payer: Self-pay

## 2022-05-17 ENCOUNTER — Ambulatory Visit (INDEPENDENT_AMBULATORY_CARE_PROVIDER_SITE_OTHER): Payer: Medicare Other | Admitting: Infectious Diseases

## 2022-05-17 ENCOUNTER — Encounter: Payer: Self-pay | Admitting: Infectious Diseases

## 2022-05-17 VITALS — BP 154/88 | HR 111 | Temp 97.9°F | Ht 60.5 in | Wt 200.0 lb

## 2022-05-17 DIAGNOSIS — S81802D Unspecified open wound, left lower leg, subsequent encounter: Secondary | ICD-10-CM | POA: Diagnosis present

## 2022-05-17 DIAGNOSIS — S81802A Unspecified open wound, left lower leg, initial encounter: Secondary | ICD-10-CM | POA: Insufficient documentation

## 2022-05-17 DIAGNOSIS — Z5181 Encounter for therapeutic drug level monitoring: Secondary | ICD-10-CM | POA: Diagnosis not present

## 2022-05-17 NOTE — Progress Notes (Addendum)
Patient Active Problem List   Diagnosis Date Noted   Acute on chronic diastolic CHF (congestive heart failure) ([place]) 04/22/2022   Hypokalemia 04/22/2022   Petechial rash 04/21/2022   Leukocytosis 04/21/2022   Cellulitis of left lower extremity 04/21/2022   Elevated troponin 04/21/2022   Chronic heart failure with preserved ejection fraction ([place]) 04/20/2022   Mild persistent asthma without complication 85/63/1497   Therapeutic drug monitoring 10/02/2019   Chronic respiratory failure with hypoxia ([place]) 03/07/2019   Physical deconditioning 02/11/2019   Obese 12/21/2018   S/P right THA, AA 12/20/2018   Pulmonary fibrosis ([place]) 10/12/2018   Chronic heart failure with preserved ejection fraction (HFpEF) ([place]) 10/12/2018   Acute on chronic respiratory failure with hypoxia ([place]) 08/29/2018   Bronchiectasis without complication ([place]) 02/63/7858   Wound infection after surgery 05/09/2018   Hyperlipidemia LDL goal <70 01/09/2017   Ankle arthritis 12/19/2016   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/2016   Hallux valgus of left foot 12/19/2016   Painful orthopaedic hardware ([place]) 12/19/2016   Pes planus 12/19/2016   Subluxation of interphalangeal joint of lesser toe of left foot 12/19/2016   Pulmonary hypertension ([place]) 10/25/2016   Coronary artery disease involving native coronary artery of native heart without angina pectoris 10/25/2016   Shortness of breath 09/12/2016   Leg edema 09/12/2016   Obstructive sleep apnea 09/12/2016   ILD (interstitial lung disease) ([place]) 09/19/2015   Thrush, oral 06/12/2014   Influenza A with pneumonia 08/02/2013   CAP (community acquired pneumonia) 07/31/2013   Hypoxia 07/31/2013   Open wound of knee, leg (except thigh), and ankle, without mention of complication 85/08/7739   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   BENZONATATE (TESSALON) 200 MG CAPSULE    TAKE 1 CAPSULE (200 MG TOTAL) BY MOUTH 3 (THREE) TIMES DAILY AS NEEDED FOR COUGH.   CETIRIZINE (ZYRTEC) 10 MG TABLET    Take 20 mg by mouth daily.   CHOLECALCIFEROL (VITAMIN D3) 250 MCG (10000 UT) CAPSULE    Take 10,000 Units by mouth daily.   CLIDINIUM-CHLORDIAZEPOXIDE (LIBRAX) 5-2.5 MG CAPSULE    Take 1 capsule by mouth 3 (three) times daily as needed (for IBS).   COLESTIPOL (COLESTID) 1 G TABLET    Take 1 g by mouth daily.   DICYCLOMINE (BENTYL) 20 MG TABLET    Take 20 mg by mouth 4 (four) times daily as needed (IBS).   FLUOXETINE (PROZAC) 20 MG CAPSULE    Take 20 mg by mouth daily.   FLUTICASONE (FLONASE) 50 MCG/ACT NASAL SPRAY    SPRAY 2 SPRAYS INTO EACH NOSTRIL EVERY DAY   FUROSEMIDE (LASIX) 40 MG TABLET    TAKE 1 TABLET BY MOUTH DAILY   IPRATROPIUM (ATROVENT) 0.03 % NASAL SPRAY    Use 1-2 sprays in both nostrils twice daily.   IPRATROPIUM-ALBUTEROL (DUONEB) 0.5-2.5 (3) MG/3ML SOLN    USE 1 VIAL IN  NEBULIZER EVERY 6 HOURS AS NEEDED J45.901   LEFLUNOMIDE (ARAVA) 20 MG TABLET    Take 1 tablet (20 mg total) by mouth daily.   LOPERAMIDE (IMODIUM) 2 MG CAPSULE    Take 1 capsule (2 mg total) by mouth 2 (two) times daily as needed for diarrhea or loose stools.   MENTHOL, TOPICAL ANALGESIC, (BIOFREEZE COOL THE PAIN) 4 % GEL    Apply 1 Application topically 2 (two) times daily as needed (pain).   METHOCARBAMOL (ROBAXIN) 750 MG TABLET    Take 750 mg by mouth 2 (two) times daily as needed for muscle spasms.    MONTELUKAST (SINGULAIR) 10 MG TABLET    Take 1 tablet (10 mg total) by mouth at bedtime.   MUPIROCIN OINTMENT (BACTROBAN) 2 %    Apply topically daily.   OMEPRAZOLE (PRILOSEC) 40 MG CAPSULE    Take 40 mg by mouth at bedtime.    OXYGEN    Inhale 7 L into the lungs continuous.   PIRFENIDONE (ESBRIET) 801 MG TABS    Take 801 mg by mouth 3 (three) times daily.   POLYETHYL GLYCOL-PROPYL GLYCOL (SYSTANE) 0.4-0.3 % GEL OPHTHALMIC GEL    Place 2 application  into both eyes 2 (two) times daily as needed (dry eyes).   POTASSIUM CHLORIDE SA (KLOR-CON M20) 20 MEQ TABLET    Take 1 tablet (20 mEq total) by mouth 2 (two) times daily.   PREDNISONE (DELTASONE) 5 MG TABLET    Take 5 mg by mouth daily with breakfast.   ROSUVASTATIN (CRESTOR) 20 MG TABLET    TAKE 1 TABLET BY MOUTH DAILY   SPIRONOLACTONE (ALDACTONE) 25 MG TABLET    Take 0.5 tablets (12.5 mg total) by mouth daily.   TOCILIZUMAB (ACTEMRA IV)    Inject into the vein every 30 (thirty) days. infusion   TRAMADOL (ULTRAM) 50 MG TABLET    Take 50 mg by mouth 3 (three) times daily as needed for moderate pain.   TRAZODONE (DESYREL) 100 MG TABLET    Take 100 mg by mouth at bedtime.   TRELEGY ELLIPTA 100-62.5-25 MCG/ACT AEPB    USE 1 INHALATION BY MOUTH DAILY   TYVASO DPI MAINTENANCE KIT 64 MCG POWD    Inhale 1 puff into the lungs in the morning, at noon, in the evening, and at bedtime.   VERAPAMIL HCL CR 300 MG CP24    Take 1 capsule (300 mg total) by mouth daily.  Modified Medications   No medications on file  Discontinued Medications   No medications on file    Subjective: Here for HFU for left leg cellulitis. Accompanied by husband. She is still taking Doxycycline and augmentin po bid since discharge from the hospital. Was seen by PCP after hospital discharge who refilled antibiotics. She now has developed a wound in the left upper anterior leg with some activce drainage which they have been doing dressing change at home daily. Denies any systemic  symptoms likel fevers, chills, sweats. Denies nausea, vomiting and diarrhea. They have not been following with wound care.   Review of Systems: all systems reviewed with pertinent positives and negatives as listed above  Past Medical History:  Diagnosis Date   Acute asthmatic bronchitis    Allergic rhinitis    Anxiety    Arthritis    Esophageal reflux    Hypertension    Interstitial lung disease ([place]) dx jan 2020   Irritable bowel syndrome    Oxygen dependent    4 liters day time  6 liters at night   PONV (postoperative nausea and vomiting)    ponv likes zofran, and scopolamine patch   Pulmonary hypertension ([place])    Rheumatoid arthritis(714.0)    Sleep apnea    Past Surgical History:  Procedure Laterality Date    c secttion  1977   2 foot fusions Left    total of 6 left foot sx   ABDOMINAL HYSTERECTOMY     complete    ANKLE FUSION  2009   left   BACK SURGERY     lower l to l 5 fused   CHOLECYSTECTOMY     RIGHT HEART CATH N/A 11/09/2020   Procedure: RIGHT HEART CATH;  Surgeon: Jolaine Artist, MD;  Location: [facility];  Service: Cardiovascular;  Laterality: N/A;   RIGHT/LEFT HEART CATH AND CORONARY ANGIOGRAPHY N/A 09/16/2016   Procedure: Right/Left Heart Cath and Coronary Angiography;  Surgeon: Nelva Bush, MD;  Location: [facility];  Service: Cardiovascular;  Laterality: N/A;   RIGHT/LEFT HEART CATH AND CORONARY ANGIOGRAPHY N/A 12/17/2021   Procedure: RIGHT/LEFT HEART CATH AND CORONARY ANGIOGRAPHY;  Surgeon: Jolaine Artist, MD;  Location: [facility];  Service: Cardiovascular;  Laterality: N/A;   TONSILLECTOMY     TOTAL HIP ARTHROPLASTY Right 12/20/2018   Procedure: TOTAL HIP ARTHROPLASTY ANTERIOR APPROACH;  Surgeon: Paralee Cancel, MD;  Location: [facility];  Service: Orthopedics;  Laterality: Right;  70 mins   TOTAL KNEE ARTHROPLASTY Bilateral     Social History   Tobacco Use   Smoking status: Never   Smokeless tobacco: Never   Tobacco  comments:    positive passive tobacco smoke exposure  Vaping Use   Vaping Use: Never used  Substance Use Topics   Alcohol use: Not Currently    Alcohol/week: 7.0 standard drinks of alcohol    Types: 7 Standard drinks or equivalent per week    Comment: 1 alcohol drink each night   Drug use: No    Family History  Problem Relation Age of Onset   Heart disease Mother    Arthritis Mother    Heart attack Father    Diabetes Other        sibling   Heart attack Other        sibling    Allergies  Allergen Reactions   Cefdinir Diarrhea   Erythromycin Base Diarrhea   Lactose Dermatitis   Lactulose Diarrhea   Mesalamine Nausea Only   Methotrexate Other (See Comments)    Felt sick   Nitrofuran Derivatives     shakiness   Other Diarrhea and Other (See Comments)    Shaking uncontrollably "lettuce only" "lettuce only"   Sulfa Antibiotics Other (See Comments)    Patient can't recall reaction    Tdap [Tetanus-Diphth-Acell Pertussis]     Shaking uncontrollably   Tetanus Toxoid, Adsorbed Other (See Comments)    Shaking uncontrollable     Health Maintenance  Topic Date Due   Hepatitis C Screening  Never done   TETANUS/TDAP  Never done   DEXA SCAN  Never done   Medicare Annual Wellness (AWV)  01/24/2017   Zoster Vaccines- Shingrix (2 of 2) 06/30/2017   COVID-19 Vaccine (4 - Pfizer risk series) 06/08/2020   INFLUENZA VACCINE  02/15/2022   Pneumonia Vaccine 66+ Years old  Completed   HPV VACCINES  Aged Out   COLONOSCOPY (Pts 45-55yr Insurance coverage will need to be confirmed)  Discontinued    Objective: BP (!) 154/88  Pulse (!) 111   Temp 97.9 F (36.6 C) (Temporal)   Ht 5' 0.5" (1.537 m)   Wt 200 lb (90.7 kg)   SpO2 91%   BMI 38.42 kg/m    Physical Exam Constitutional:      Appearance: Normal appearance. Obese  HENT:     Head: Normocephalic and atraumatic.      Mouth: Mucous membranes are moist.  Eyes:    Conjunctiva/sclera: Conjunctivae normal.      Pupils:   Cardiovascular:     Rate and Rhythm: Normal rate and regular rhythm.     Heart sounds:  Pulmonary:     Effort: Pulmonary effort is normal on nasal oxygen     Breath sounds: Normal breath sounds.   Abdominal:     General: Non distended     Palpations: soft.   Musculoskeletal:        General:   Left leg - redness and swelling seems to have improved compared to last seen in the hospital. Left upper leg wound is new , does not appear deep enough to involve muscle or bone, whitish drainge, non purulent. No surrounding signs of cellulitis  Left TKA with no concerns for involvement Left ankle and foot swollen but no redness and tenderness  RT leg with no concerns     Skin:    General: Skin is warm and dry.     Comments:  Neurological:     General: grossly non focal     Mental Status: awake, alert and oriented to person, place, and time.   Psychiatric:        Mood and Affect: Mood normal.   Lab Results Lab Results  Component Value Date   WBC 4.9 05/02/2022   HGB 10.1 (L) 05/02/2022   HCT 32.3 (L) 05/02/2022   MCV 101.3 (H) 05/02/2022   PLT 240 05/02/2022    Lab Results  Component Value Date   CREATININE 0.71 05/03/2022   BUN 10 05/03/2022   NA 138 05/03/2022   K 4.0 05/03/2022   CL 102 05/03/2022   CO2 32 05/03/2022    Lab Results  Component Value Date   ALT 16 04/20/2022   AST 16 04/20/2022   ALKPHOS 67 04/20/2022   BILITOT 0.6 04/20/2022    Lab Results  Component Value Date   CHOL 135 09/02/2020   HDL 74 09/02/2020   LDLCALC 40 09/02/2020   LDLDIRECT 53 01/09/2017   TRIG 122 09/02/2020   CHOLHDL 1.8 09/02/2020   No results found for: "LABRPR", "RPRTITER" No results found for: "HIV1RNAQUANT", "HIV1RNAVL", "CD4TABS"   Assessment/Plan 76 year old female with a PMH of asthma, anxiety, hypertension, GERD, ILD with oxygen dependence 7 L, pulmonary hypertension, RA on chronic prednisone and leflunomide,  sleep apnea, IBS, CHFpEF,  right THA and  bilateral knee arthroplasty, s/p 6 surgeries in the left ankle with hardware here for LLE cellulitis.   # Left upper anterior leg wound in the setting of recent cellulitis  # Medication Monitoring   She has improved redness and swelling in her left leg compared to when last seen however, developed a wound without any incit8ng trauma . Less likely to be abtx failure. Would avoid linezolid due to DDI. Of note, she is also on prednisone and Leflunomide for her RA.    The wound does not appear to deep but would get an Xray to look r/o any deeper involvement   She will need aggressive wound care for her wound and hence, will place a  wound care referral   Labs today  Will continue doxycyline and augmentin as is pending fu with wound care. She has enough pills   Fu in 10-14 days to monitor wound   Discussed to go to ED in case of any systemic signs of infection like  fevers, chills, nausea, vomiting, fatigue.    I have personally spent 42  minutes involved in face-to-face and non-face-to-face activities for this patient on the day of the visit. Professional time spent includes the following activities: Preparing to see the patient (review of tests), Obtaining and/or reviewing separately obtained history (admission/discharge record), Performing a medically appropriate examination and/or evaluation , Ordering medications/tests/procedures, referring and communicating with other health care professionals, Documenting clinical information in the EMR, Independently interpreting results (not separately reported), Communicating results to the patient/family/caregiver, Counseling and educating the patient/family/caregiver and Care coordination (not separately reported).   Wilber Oliphant, [place] [facility] 05/17/2022, 3:58 PM

## 2022-05-18 ENCOUNTER — Telehealth: Payer: Self-pay

## 2022-05-18 LAB — CBC
HCT: 38.9 % (ref 35.0–45.0)
Hemoglobin: 12.9 g/dL (ref 11.7–15.5)
MCH: 32.4 pg (ref 27.0–33.0)
MCHC: 33.2 g/dL (ref 32.0–36.0)
MCV: 97.7 fL (ref 80.0–100.0)
MPV: 11.4 fL (ref 7.5–12.5)
Platelets: 239 10*3/uL (ref 140–400)
RBC: 3.98 10*6/uL (ref 3.80–5.10)
RDW: 12.8 % (ref 11.0–15.0)
WBC: 7.6 10*3/uL (ref 3.8–10.8)

## 2022-05-18 LAB — COMPREHENSIVE METABOLIC PANEL
AG Ratio: 2.2 (calc) (ref 1.0–2.5)
ALT: 14 U/L (ref 6–29)
AST: 14 U/L (ref 10–35)
Albumin: 4 g/dL (ref 3.6–5.1)
Alkaline phosphatase (APISO): 88 U/L (ref 37–153)
BUN: 19 mg/dL (ref 7–25)
CO2: 26 mmol/L (ref 20–32)
Calcium: 10.7 mg/dL — ABNORMAL HIGH (ref 8.6–10.4)
Chloride: 101 mmol/L (ref 98–110)
Creat: 0.7 mg/dL (ref 0.60–1.00)
Globulin: 1.8 g/dL (calc) — ABNORMAL LOW (ref 1.9–3.7)
Glucose, Bld: 127 mg/dL — ABNORMAL HIGH (ref 65–99)
Potassium: 3.8 mmol/L (ref 3.5–5.3)
Sodium: 138 mmol/L (ref 135–146)
Total Bilirubin: 0.6 mg/dL (ref 0.2–1.2)
Total Protein: 5.8 g/dL — ABNORMAL LOW (ref 6.1–8.1)

## 2022-05-18 LAB — SEDIMENTATION RATE: Sed Rate: 2 mm/h (ref 0–30)

## 2022-05-18 LAB — C-REACTIVE PROTEIN: CRP: 0.3 mg/L (ref <8.0)

## 2022-05-18 NOTE — Telephone Encounter (Signed)
-----  Message from Rosiland Oz, MD sent at 05/18/2022  8:06 AM EDT ----- Regarding: medication Hi team,   Please let her know to hold off on leflunomide which she is taking for RA while her leg wound is being treated.   Thanks

## 2022-05-18 NOTE — Telephone Encounter (Signed)
Called patient to relay message, no answer left voicemail.

## 2022-05-19 ENCOUNTER — Ambulatory Visit
Admission: RE | Admit: 2022-05-19 | Discharge: 2022-05-19 | Disposition: A | Discharge status: Home / Self Care | Payer: Medicare Other | Source: Ambulatory Visit | Attending: Infectious Diseases | Admitting: Infectious Diseases

## 2022-05-19 NOTE — Telephone Encounter (Signed)
Patient confirmed that she has been holding on RA medication while being treated for leg wound.

## 2022-05-20 ENCOUNTER — Encounter (HOSPITAL_BASED_OUTPATIENT_CLINIC_OR_DEPARTMENT_OTHER): Payer: Medicare Other | Admitting: Internal Medicine

## 2022-05-28 ENCOUNTER — Other Ambulatory Visit: Payer: Self-pay | Admitting: Internal Medicine

## 2022-05-31 ENCOUNTER — Other Ambulatory Visit: Payer: Self-pay | Admitting: Internal Medicine

## 2022-05-31 DIAGNOSIS — I272 Pulmonary hypertension, unspecified: Secondary | ICD-10-CM

## 2022-06-03 ENCOUNTER — Encounter (HOSPITAL_BASED_OUTPATIENT_CLINIC_OR_DEPARTMENT_OTHER): Payer: Medicare Other | Attending: Internal Medicine | Admitting: Internal Medicine

## 2022-06-03 DIAGNOSIS — M069 Rheumatoid arthritis, unspecified: Secondary | ICD-10-CM | POA: Diagnosis not present

## 2022-06-03 DIAGNOSIS — I110 Hypertensive heart disease with heart failure: Secondary | ICD-10-CM | POA: Insufficient documentation

## 2022-06-03 DIAGNOSIS — I87312 Chronic venous hypertension (idiopathic) with ulcer of left lower extremity: Secondary | ICD-10-CM | POA: Diagnosis not present

## 2022-06-03 DIAGNOSIS — I5032 Chronic diastolic (congestive) heart failure: Secondary | ICD-10-CM | POA: Insufficient documentation

## 2022-06-03 DIAGNOSIS — J849 Interstitial pulmonary disease, unspecified: Secondary | ICD-10-CM | POA: Diagnosis not present

## 2022-06-03 DIAGNOSIS — L97828 Non-pressure chronic ulcer of other part of left lower leg with other specified severity: Secondary | ICD-10-CM | POA: Insufficient documentation

## 2022-06-03 DIAGNOSIS — G4733 Obstructive sleep apnea (adult) (pediatric): Secondary | ICD-10-CM | POA: Insufficient documentation

## 2022-06-03 NOTE — Progress Notes (Signed)
[name], [name] ([id number]) [id number] Page 1 of 4 Visit Report for 06/03/2022 Abuse Risk Screen Details Patient Name: Date of Service: Hailey Courser, DO [name] 06/03/2022 8:00 A M Medical Record Number: [id number] Patient Account Number: [id number] Date of Birth/Sex: Treating RN: [name] (76 y.o. Female) [name] Primary Care [name]: [name] Other Clinician: Referring [name]: Treating [name]/Extender: [name] in Treatment: 0 Abuse Risk Screen Items Answer ABUSE RISK SCREEN: Has anyone close to you tried to hurt or harm you recentlyo No Do you feel uncomfortable with anyone in your familyo No Has anyone forced you do things that you didnt want to doo No Electronic Signature(s) Signed: 06/03/2022 12:09:11 PM By: Hailey Hammock RN Entered By: [name] on 06/03/2022 08:11:53 -------------------------------------------------------------------------------- Activities of Daily Living Details Patient Name: Date of Service: [name] 06/03/2022 8:00 A M Medical Record Number: [id number] Patient Account Number: [id number] Date of Birth/Sex: Treating RN: [name] (76 y.o. Female) [name] Primary Care [name]: [name] Other Clinician: Referring [name]: Treating [name]/Extender: [name] in Treatment: 0 Activities of Daily Living Items Answer Activities of Daily Living (Please select one for each item) Drive Automobile Not Able T Medications ake Need Assistance Use T elephone Need Assistance Care for Appearance Need Assistance Use T oilet Need Assistance Bath / Shower Need Assistance Dress Self Need Assistance Feed Self Need Assistance Walk Need Assistance Get In / Out Bed Need Assistance Housework Need Assistance Prepare Meals Need Assistance Handle Money Need Assistance Shop for Self Need Assistance Electronic  Signature(s) Signed: 06/03/2022 12:09:11 PM By: Hailey Hammock RN Entered By: [name] on 06/03/2022 08:12:29 [name] ([id number]) [id number] Page 2 of 4 -------------------------------------------------------------------------------- Education Screening Details Patient Name: Date of Service: Hailey Courser, DO [name] 06/03/2022 8:00 A M Medical Record Number: [id number] Patient Account Number: [id number] Date of Birth/Sex: Treating RN: [name] (76 y.o. Female) [name] Primary Care [name]: [name] Other Clinician: Referring [name]: Treating [name] in Treatment: 0 Primary Learner Assessed: Patient Learning Preferences/Education Level/Primary Language Learning Preference: Explanation, Demonstration, Communication Board, Printed Material Highest Education Level: College or Above Preferred Language: English Cognitive Barrier Language Barrier: No Translator Needed: No Memory Deficit: No Emotional Barrier: No Cultural/Religious Beliefs Affecting Medical Care: No Physical Barrier Impaired Vision: No Impaired Hearing: No Decreased Hand dexterity: No Knowledge/Comprehension Knowledge Level: High Comprehension Level: High Ability to understand written instructions: High Ability to understand verbal instructions: High Motivation Anxiety Level: Calm Cooperation: Cooperative Education Importance: Denies Need Interest in Health Problems: Asks Questions Perception: Coherent Willingness to Engage in Self-Management High Activities: Readiness to Engage in Self-Management High Activities: Electronic Signature(s) Signed: 06/03/2022 12:09:11 PM By: Hailey Hammock RN Entered By: [name] on 06/03/2022 08:13:02 -------------------------------------------------------------------------------- Fall Risk Assessment Details Patient Name: Date of Service: Hailey [name], DO [name] 06/03/2022 8:00 A M Medical Record Number: [id number] Patient Account Number: [id number] Date of Birth/Sex: Treating RN: [birth year] (76 y.o. Female) [name] Primary Care [name]: [name] Other Clinician: Referring [name]: Treating [name] in Treatment: 0 Fall Risk Assessment Items Have you had 2 or more falls in the last [address] monthso 0 No [name], [name] ([id number]) [phone] [id number] Page 3 of 4 Have you had any fall that resulted in injury in the last 12 monthso 0 No FALLS RISK SCREEN History of falling - immediate or within 3 months 0  No Secondary diagnosis (Do you have 2 or more medical diagnoseso) 0 No Ambulatory aid None/bed rest/wheelchair/nurse 0 No Crutches/cane/walker 0 No Furniture 0 No Intravenous therapy Access/Saline/Heparin Lock 0 No Gait/Transferring Normal/ bed rest/ wheelchair 0 No Weak (short steps with or without shuffle, stooped but able to lift head while walking, may seek 0 No support from furniture) Impaired (short steps with shuffle, may have difficulty arising from chair, head down, impaired 0 No balance) Mental Status Oriented to own ability 0 No Electronic Signature(s) Signed: 06/03/2022 12:09:11 PM By: Hailey Hammock RN Entered By: [name] on 06/03/2022 08:13:15 -------------------------------------------------------------------------------- Foot Assessment Details Patient Name: Date of Service: Hailey Courser, DO [name] 06/03/2022 8:00 A M Medical Record Number: [id number] Patient Account Number: [id number] Date of Birth/Sex: Treating RN: [name] (76 y.o. Female) [name] Primary Care [name]: [name] Other Clinician: Referring [name]: Treating [name] in Treatment: 0 Foot Assessment Items Site Locations + = Sensation present, - = Sensation absent, C =  Callus, U = Ulcer R = Redness, W = Warmth, M = Maceration, PU = Pre-ulcerative lesion F = Fissure, S = Swelling, D = Dryness Assessment Right: Left: Other Deformity: No No Prior Foot Ulcer: No No Prior Amputation: No No Charcot Joint: No No Ambulatory Status: Ambulatory With Help Assistance Device: [place], [name] ([id number]) [phone] [id number] Page 4 of 4 Gait: Steady Electronic Signature(s) Signed: 06/03/2022 12:09:11 PM By: Hailey Hammock RN Entered By: [name] on 06/03/2022 08:26:34 -------------------------------------------------------------------------------- Nutrition Risk Screening Details Patient Name: Date of Service: Hailey Courser, DO [name] 06/03/2022 8:00 A M Medical Record Number: [id number] Patient Account Number: [id number] Date of Birth/Sex: Treating RN: [name] (76 y.o. Female) [name] Primary Care Aroldo Galli: [name] Other Clinician: Referring Tristin Vandeusen: Treating Devine Dant/Extender: [name] in Treatment: 0 Height (in): 61 Weight (lbs): 195 Body Mass Index (BMI): 36.8 Nutrition Risk Screening Items Score Screening NUTRITION RISK SCREEN: I have an illness or condition that made me change the kind and/or amount of food I eat 0 No I eat fewer than two meals per day 0 No I eat few fruits and vegetables, or milk products 0 No I have three or more drinks of beer, liquor or wine almost every day 0 No I have tooth or mouth problems that make it hard for me to eat 0 No I don't always have enough money to buy the food I need 0 No I eat alone most of the time 0 No I take three or more different prescribed or over-the-counter drugs a day 0 No Without wanting to, I have lost or gained 10 pounds in the last six months 0 No I am not always physically able to shop, Moan and/or feed myself 0 No Nutrition Protocols Good Risk Protocol 0 No interventions needed Moderate Risk  Protocol High Risk Proctocol Risk Level: Good Risk Score: 0 Electronic Signature(s) Signed: 06/03/2022 12:09:11 PM By: Hailey Hammock RN Entered By: [name] on 06/03/2022 08:13:33

## 2022-06-04 NOTE — Progress Notes (Signed)
[name], [name] ([id number]) [id number] Page 1 of 11 Visit Report for 06/03/2022 Chief Complaint Document Details Patient Name: Date of Service: Hailey Courser, [name] [name]. 06/03/2022 8:00 A M Medical Record Number: [id number] Patient Account Number: [id number] Date of Birth/Sex: Treating RN: Feb 21, 1946 (76 y.o. F) Primary Care Provider: Lorene Dy Other Clinician: Referring Provider: Treating Provider/Extender: [name] in Treatment: 0 Information Obtained from: Patient Chief Complaint 06/03/2022; Left lower extremity wounds Electronic Signature([name]) Signed: 06/03/2022 10:08:50 AM By: Kalman Shan [name] Entered By: Kalman Shan on 06/03/2022 09:05:45 -------------------------------------------------------------------------------- Debridement Details Patient Name: Date of Service: Hailey Courser, [name] [name]. 06/03/2022 8:00 A M Medical Record Number: [id number] Patient Account Number: [id number] Date of Birth/Sex: Treating RN: [birth year] (76 y.o. [name], [name] Primary Care Provider: Lorene Dy Other Clinician: Referring Provider: Treating Provider/Extender: [name] in Treatment: 0 Debridement Performed for Assessment: Wound #5 Left,Distal,Anterior Lower Leg Performed By: Physician Kalman Shan, [name] Debridement Type: Debridement Level of Consciousness (Pre-procedure): Awake and Alert Pre-procedure Verification/Time Out Yes - 08:45 Taken: Start Time: 08:46 Pain Control: Lidocaine 5% topical ointment T Area Debrided (L x W): otal 5.5 (cm) x 3.6 (cm) = 19.8 (cm) Tissue and other material debrided: Viable, Non-Viable, Slough, Subcutaneous, Slough Level: Skin/Subcutaneous Tissue Debridement Description: Excisional Instrument: Curette Bleeding: Minimum Hemostasis Achieved: Pressure End Time: 08:51 Procedural Pain: 0 Post Procedural Pain: 0 Response to Treatment: Procedure was  tolerated well Level of Consciousness (Post- Awake and Alert procedure): Post Debridement Measurements of Total Wound Length: (cm) 5.5 Width: (cm) 3.6 Depth: (cm) 0.3 Volume: (cm) 4.665 Character of Wound/Ulcer Post Debridement: Requires Further Debridement Post Procedure Diagnosis [name] ([id number]) [id number] Page 2 of 11 Same as Pre-procedure Electronic Signature([name]) Signed: 06/03/2022 10:08:50 AM By: Kalman Shan [name] Signed: 06/03/2022 5:21:52 PM By: Deon Pilling RN, BSN Entered By: Deon Pilling on 06/03/2022 08:52:19 -------------------------------------------------------------------------------- Debridement Details Patient Name: Date of Service: Hailey Courser, [name] [name]. 06/03/2022 8:00 A M Medical Record Number: [id number] Patient Account Number: [id number] Date of Birth/Sex: Treating RN: Sep 07, 1945 (77 y.o. [name], [name] Primary Care Provider: Lorene Dy Other Clinician: Referring Provider: Treating Provider/Extender: [name] in Treatment: 0 Debridement Performed for Assessment: Wound #4 Left,Proximal,Anterior Lower Leg Performed By: Clinician Deon Pilling, RN Debridement Type: Chemical/Enzymatic/Mechanical Agent Used: wound cleanser and gauze Level of Consciousness (Pre-procedure): Awake and Alert Pre-procedure Verification/Time Out No Taken: Bleeding: None Response to Treatment: Procedure was tolerated well Level of Consciousness (Post- Awake and Alert procedure): Post Debridement Measurements of Total Wound Length: (cm) 3 Width: (cm) 1.8 Depth: (cm) 0.3 Volume: (cm) 1.272 Character of Wound/Ulcer Post Debridement: Requires Further Debridement Post Procedure Diagnosis Same as Pre-procedure Electronic Signature([name]) Signed: 06/03/2022 10:08:50 AM By: Kalman Shan [name] Signed: 06/03/2022 5:21:52 PM By: Deon Pilling RN, BSN Entered By: Deon Pilling on 06/03/2022  09:27:52 -------------------------------------------------------------------------------- HPI Details Patient Name: Date of Service: [name]. 06/03/2022 8:00 A M Medical Record Number: [id number] Patient Account Number: [id number] Date of Birth/Sex: Treating RN: 1946/06/17 (76 y.o. F) Primary Care Provider: Lorene Dy Other Clinician: Referring Provider: Treating Provider/Extender: [name] in Treatment: 0 History of Present Illness HPI Description: Patient presents today for initial evaluation and our clinic as a referral from the [facility] health system Department of orthopedics for evaluation and treatment of the wound to his his of the left foot at the base of the great toe. Currently the  good news is the patient really does not have any significant pain which is excellent. She has been seen by Dr. Linus Salmons at [facility]: infectious disease clinic that is the regional Center for infectious disease. Subsequently the patient was cultured and positive for methicillin sensitive staph aureus. She has been on antibiotics including Cipro prior to surgery as will [name], [name] ([id number]) [id number] Page 3 of 11 several days following surgery. She then had clindamycin which was changed to doxycycline. She took this for 10 days. Subsequently the pain had improved and there was no longer any possible draining from the wound according to the notes. The patient sedentary rate as well as C-reactive protein have returned to normal ranges. Currently per Dr. Henreitta Leber assessment there was one dehiscence with no sign of osteomyelitis. He therefore discontinue the antibiotics with the completion of the last three days Of doxycycline. Other than that he just set her for a follow-up as needed. The patient does not have diabetes and is not a current smoker. At this time her treatment that was recommended by the surgeon was applying a  Hydrocolloid dressing. Based on what I'm seeing at this point the patient actually has some Slough noted over the surface of the wound there really does not appear to be evidence of infection though I [name] think she does require some sharp debridement to clear away the slough and help with appropriate wound healing. She does have areas of granulation buds noted. She may be a candidate for a skin substitute as well. We will see how things [name] over the next period of time until follow-up. No fevers, chills, nausea, or vomiting noted at this time. 06/13/18 on evaluation today patient actually appears to be doing better in regard to her toe ulcer. She'[name] been using the Prisma on this region and that seems to have done extremely well for her. Fortunately there does not appear to be evidence of infection at this time which is good news. She did see her surgeon they were extremely pleased with the overall appearance of her wound. 06/27/18 on evaluation today patient actually appears to be doing very well in regard to her foot ulcer. She has been tolerating the dressing changes without complication which is great news. She did see her podiatrist they felt like everything is looking very nice as well and will please. 07/05/2018 surgical wound on the left foot. She appears to be doing well. Still surface debrided to remove however in general post debridement the wound bed looks quite healthy it is come down significantly in terms of dimensions using silver collagen. The patient describes pain when her foot is elevated either in bed at night [sometimes keeps her awake] or when is propped up on a foot rest. She basically takes analgesics for this. She does not really describe claudication with activity however her activity is very limited by interstitial lung disease. Her ABIs initially in this clinic were noncompressible. She is not a diabetic 07/20/2018; surgical wound on the left foot. Wound actually looks quite a  bit better than I remember seeing this. She is still describing pain with her leg elevated at night that she does not get when the wound is supine. She does not really describe claudication but she is very limited by her pulmonary status. We have been using silver collagen. 1/17; the patient has been followed up by orthopedics and discharge. Her arterial studies were actually quite good and should not be contributing to any pain. Slight reduction in ABIs  but otherwise normal. Her wound is closed. She saw Dr. Linus Salmons of infectious disease surrounding the surgery. By review of our notes she was not felt to have osteomyelitis. 06/03/2022 Ms. Verdie Wilms is a 76 year old female with a past medical history of interstitial lung disease on chronic oxygen via nasal cannula, rheumatoid arthritis, chronic diastolic heart failure and OSA that presents to the clinic for a 1 to 73-monthhistory of nonhealing ulcer to the left lower extremity. On 04/20/2022 she was admitted to the hospital for left lower extremity cellulitis. She required 8 days of IV vancomycin and Unasyn followed by a 14-day course of Augmentin and doxycycline. She has been using Silvadene cream to the area. She does not use compression therapy but does own compression stockings. She currently denies signs of infection. Electronic Signature([name]) Signed: 06/03/2022 10:08:50 AM By: HKalman ShanDO Entered By: HKalman Shanon 06/03/2022 09:23:43 -------------------------------------------------------------------------------- Physical Exam Details Patient Name: Date of Service: CArnetha Courser [name] [name]. 06/03/2022 8:00 A M Medical Record Number: [id number] Account Number: [id number] of Birth/Sex: Treating RN: [birth year](76y.o. F) Primary Care Provider: RLorene DyOther Clinician: Referring Provider: Treating Provider/Extender: HKrista Bluein Treatment: 0 Constitutional respirations regular,  non-labored and within target range for patient.. Cardiovascular 2+ dorsalis pedis/posterior tibialis pulses. Psychiatric pleasant and cooperative. Notes T the lateral aspect of the left lower extremity there are 2 open wounds with nonviable surface throughout. 2+ pitting edema to the knee. Obvious deformity to o the foot From previous surgeries. No signs of surrounding infection. Electronic Signature([name]) Signed: 06/03/2022 10:08:50 AM By: HKalman ShanDO Entered By: HKalman Shanon 06/03/2022 09:25:56 CBarrie Dunker([id number] Page 4 of 11 -------------------------------------------------------------------------------- Physician Orders Details Patient Name: Date of Service: CArnetha Courser [name] [name]. 06/03/2022 8:00 A M Medical Record Number: [id number] Account Number: [id number] of Birth/Sex: Treating RN: 9February 03, 1947(76y.o. FHelene [name] BMeta.RedingPrimary Care Provider: RLorene DyOther Clinician: Referring Provider: Treating Provider/Extender: HKrista Bluein Treatment: 0 Verbal / Phone Orders: No Diagnosis Coding ICD-10 Coding Code Description [id number]-pressure chronic ulcer of other part of left lower leg with other specified severity I87.312 Chronic venous hypertension (idiopathic) with ulcer of left lower extremity J84.9 Interstitial pulmonary disease, unspecified M06.9 Rheumatoid arthritis, unspecified Follow-up Appointments ppointment in 2 weeks. - Dr. HHeber [place]after Thanksgiving Monday or Tuesday afternoon Return A Anesthetic (In clinic) Topical Lidocaine 5% applied to wound bed Bathing/ Shower/ Hygiene May shower with protection but [name] not get wound dressing([name]) wet. Edema Control - Lymphedema / SCD / Other Elevate legs to the level of the heart or above for 30 minutes daily and/or when sitting, a frequency of: - 3-4 times a day throughout the day. Avoid standing for long periods of  time. Wound Treatment Wound #4 - Lower Leg Wound Laterality: Left, Anterior, Proximal Cleanser: Wound Cleanser (DME) (Generic) 1 x Per Day/30 Days Discharge Instructions: Cleanse the wound with wound cleanser prior to applying a clean dressing using gauze sponges, not tissue or cotton balls. Prim Dressing: Hydrofera Blue Ready Foam, 4x5 in (DME) (Generic) 1 x Per Day/30 Days ary Discharge Instructions: Apply to wound bed as instructed Prim Dressing: MediHoney Gel, tube 1.5 (oz) 1 x Per Day/30 Days ary Discharge Instructions: Apply to wound bed as instructed Secondary Dressing: ABD Pad, 8x10 1 x Per Day/30 Days Discharge Instructions: Apply over primary dressing as directed. Secured With: [employer] 4.5x3.1 (in/yd) (DME) (Generic)  1 x Per Day/30 Days Discharge Instructions: Secure with Kerlix as directed. Secured With: 27M Medipore H Soft Cloth Surgical T ape, 4 x 10 (in/yd) (DME) (Generic) 1 x Per Day/30 Days Discharge Instructions: Secure with tape as directed. Compression Wrap: TUBIGRIP SIZE E 1 x Per Day/30 Days Discharge Instructions: apply in the morning and remove at night. ONE LAYER [name] NOT DOUBLE!!! Wound #5 - Lower Leg Wound Laterality: Left, Anterior, Distal Cleanser: Wound Cleanser (DME) (Generic) 1 x Per Day/30 Days Discharge Instructions: Cleanse the wound with wound cleanser prior to applying a clean dressing using gauze sponges, not tissue or cotton balls. Prim Dressing: Hydrofera Blue Ready Foam, 4x5 in (DME) (Generic) 1 x Per Day/30 Days ary Discharge Instructions: Apply to wound bed as instructed Prim Dressing: MediHoney Gel, tube 1.5 (oz) 1 x Per Day/30 Days ary Discharge Instructions: Apply to wound bed as instructed Secondary Dressing: ABD Pad, 8x10 1 x Per Day/30 Days Discharge Instructions: Apply over primary dressing as directed. Secured With: [employer], 4.5x3.1 (in/yd) (DME) (Generic) 1 x Per Day/30 Days TOSHIKO, KEMLER ([id number])  [id number] Page 5 of 11 Discharge Instructions: Secure with Kerlix as directed. Secured With: 27M Medipore H Soft Cloth Surgical T ape, 4 x 10 (in/yd) (DME) (Generic) 1 x Per Day/30 Days Discharge Instructions: Secure with tape as directed. Compression Wrap: TUBIGRIP SIZE E 1 x Per Day/30 Days Discharge Instructions: apply in the morning and remove at night. ONE LAYER [name] NOT DOUBLE!!! Patient Medications llergies: cefdinir, erythromycin base, lactulose, mesalamine, nitrofuran derivative, nitrofurantoin, lettuce, Sulfa (Sulfonamide Antibiotics), Adacel(Tdap A Adolesn/Adult)(PF), tetanus toxoid, adsorbed Notifications Medication Indication Start End 06/03/2022 lidocaine DOSE topical 5 % cream - cream topical applied only in clinic for any debridements Electronic Signature([name]) Signed: 06/03/2022 10:08:50 AM By: Kalman Shan [name] Entered By: Kalman Shan on 06/03/2022 09:26:04 -------------------------------------------------------------------------------- Problem List Details Patient Name: Date of Service: [name]. 06/03/2022 8:00 A M Medical Record Number: [id number] Patient Account Number: [id number] Date of Birth/Sex: Treating RN: 1946/03/24 (76 y.o. F) Primary Care Provider: Lorene Dy Other Clinician: Referring Provider: Treating Provider/Extender: [name] in Treatment: 0 Active Problems ICD-10 Encounter Code Description Active Date MDM Diagnosis L97.828 Non-pressure chronic ulcer of other part of left lower leg with other specified 06/03/2022 No Yes severity I87.312 Chronic venous hypertension (idiopathic) with ulcer of left lower extremity 06/03/2022 No Yes J84.9 Interstitial pulmonary disease, unspecified 06/03/2022 No Yes M06.9 Rheumatoid arthritis, unspecified 06/03/2022 No Yes Inactive Problems Resolved Problems Electronic Signature([name]) Signed: 06/03/2022 10:08:50 AM By: Kalman Shan  [name] Entered By: Kalman Shan on 06/03/2022 [place], [place] ([id number]) [id number] Page 6 of 11 -------------------------------------------------------------------------------- Progress Note Details Patient Name: Date of Service: Hailey Courser, [name] [name]. 06/03/2022 8:00 A M Medical Record Number: [id number] Patient Account Number: [id number] Date of Birth/Sex: Treating RN: [birth year] (76 y.o. F) Primary Care Provider: Lorene Dy Other Clinician: Referring Provider: Treating Provider/Extender: [name] in Treatment: 0 Subjective Chief Complaint Information obtained from Patient 06/03/2022; Left lower extremity wounds History of Present Illness (HPI) Patient presents today for initial evaluation and our clinic as a referral from the [facility] Department of orthopedics for evaluation and treatment of the wound to his his of the left foot at the base of the great toe. Currently the good news is the patient really does not have any significant pain which is excellent. She has been seen by Dr. Linus Salmons at [facility]: infectious  disease clinic that is the regional Center for infectious disease. Subsequently the patient was cultured and positive for methicillin sensitive staph aureus. She has been on antibiotics including Cipro prior to surgery as will several days following surgery. She then had clindamycin which was changed to doxycycline. She took this for 10 days. Subsequently the pain had improved and there was no longer any possible draining from the wound according to the notes. The patient sedentary rate as well as C-reactive protein have returned to normal ranges. Currently per Dr. Henreitta Leber assessment there was one dehiscence with no sign of osteomyelitis. He therefore discontinue the antibiotics with the completion of the last three days Of doxycycline. Other than that he just set her for a follow-up as  needed. The patient does not have diabetes and is not a current smoker. At this time her treatment that was recommended by the surgeon was applying a Hydrocolloid dressing. Based on what I'm seeing at this point the patient actually has some Slough noted over the surface of the wound there really does not appear to be evidence of infection though I [name] think she does require some sharp debridement to clear away the slough and help with appropriate wound healing. She does have areas of granulation buds noted. She may be a candidate for a skin substitute as well. We will see how things [name] over the next period of time until follow-up. No fevers, chills, nausea, or vomiting noted at this time. 06/13/18 on evaluation today patient actually appears to be doing better in regard to her toe ulcer. She'[name] been using the Prisma on this region and that seems to have done extremely well for her. Fortunately there does not appear to be evidence of infection at this time which is good news. She did see her surgeon they were extremely pleased with the overall appearance of her wound. 06/27/18 on evaluation today patient actually appears to be doing very well in regard to her foot ulcer. She has been tolerating the dressing changes without complication which is great news. She did see her podiatrist they felt like everything is looking very nice as well and will please. 07/05/2018 surgical wound on the left foot. She appears to be doing well. Still surface debrided to remove however in general post debridement the wound bed looks quite healthy it is come down significantly in terms of dimensions using silver collagen. The patient describes pain when her foot is elevated either in bed at night [sometimes keeps her awake] or when is propped up on a foot rest. She basically takes analgesics for this. She does not really describe claudication with activity however her activity is very limited by interstitial lung disease.  Her ABIs initially in this clinic were noncompressible. She is not a diabetic 07/20/2018; surgical wound on the left foot. Wound actually looks quite a bit better than I remember seeing this. She is still describing pain with her leg elevated at night that she does not get when the wound is supine. She does not really describe claudication but she is very limited by her pulmonary status. We have been using silver collagen. 1/17; the patient has been followed up by orthopedics and discharge. Her arterial studies were actually quite good and should not be contributing to any pain. Slight reduction in ABIs but otherwise normal. Her wound is closed. She saw Dr. Linus Salmons of infectious disease surrounding the surgery. By review of our notes she was not felt to have osteomyelitis. 06/03/2022 Ms. Kaylen Motl is  a 76 year old female with a past medical history of interstitial lung disease on chronic oxygen via nasal cannula, rheumatoid arthritis, chronic diastolic heart failure and OSA that presents to the clinic for a 1 to 74-monthhistory of nonhealing ulcer to the left lower extremity. On 04/20/2022 she was admitted to the hospital for left lower extremity cellulitis. She required 8 days of IV vancomycin and Unasyn followed by a 14-day course of Augmentin and doxycycline. She has been using Silvadene cream to the area. She does not use compression therapy but does own compression stockings. She currently denies signs of infection. Patient History Information obtained from Patient. Allergies cefdinir (Reaction: diarrhea), erythromycin base (Reaction: diarrhea), lactulose (Reaction: diarrhea), mesalamine (Reaction: nausea), nitrofuran derivative (Reaction: "shakes"), nitrofurantoin (Reaction: "shakes"), lettuce (Reaction: diarrhea), Sulfa (Sulfonamide Antibiotics) (Reaction: unsure), Adacel(Tdap Adolesn/Adult)(PF) (Reaction: "shakes"), tetanus toxoid, adsorbed (Reaction: "shakes") Family History Cancer -  Maternal Grandparents, Diabetes - Siblings, Heart Disease - Maternal Grandparents,Paternal Grandparents,Mother,Father,Siblings, Hypertension - Mother,Father,Siblings, Stroke - Paternal Grandparents, No family history of Hereditary Spherocytosis, Kidney Disease, Lung Disease, Seizures, Thyroid Problems, Tuberculosis. Social History Never smoker, Marital Status - Married, Alcohol Use - Moderate, Drug Use - No History, Caffeine Use - Moderate. Medical History Eyes Denies history of Cataracts, Optic Neuritis Ear/Nose/Mouth/Throat Denies history of Chronic sinus problems/congestion Hematologic/Lymphatic Denies history of Anemia, Hemophilia, Human Immunodeficiency Virus, Lymphedema, Sickle Cell Disease CRACHELE, LAMASTER([id number] Page 7 of 11 Respiratory Patient has history of Asthma, Sleep Apnea Denies history of Aspiration, Chronic Obstructive Pulmonary Disease (COPD), Pneumothorax, Tuberculosis Cardiovascular Patient has history of Hypertension Denies history of Angina, Arrhythmia, Congestive Heart Failure, Coronary Artery Disease, Deep Vein Thrombosis, Hypotension, Myocardial Infarction, Peripheral Arterial Disease, Peripheral Venous Disease, Phlebitis, Vasculitis Gastrointestinal Patient has history of Colitis Denies history of Cirrhosis , Crohnoos, Hepatitis A, Hepatitis B, Hepatitis C Endocrine Denies history of Type I Diabetes, Type II Diabetes Immunological Denies history of Lupus Erythematosus, Raynaudoos, Scleroderma Integumentary (Skin) Denies history of History of Burn Musculoskeletal Patient has history of Rheumatoid Arthritis, Osteoarthritis Denies history of Gout, Osteomyelitis Neurologic Denies history of Dementia, Neuropathy, Quadriplegia, Paraplegia, Seizure Disorder Oncologic Denies history of Received Chemotherapy, Received Radiation Psychiatric Denies history of Anorexia/bulimia, Confinement  Anxiety Hospitalization/Surgery History - surgery toe. - right hip replacement 12/2018. - heart cath 12/17/2021. Medical A Surgical History Notes nd Respiratory 4L [place] 02 pulmonary fibrosis Objective Constitutional respirations regular, non-labored and within target range for patient.. Vitals Time Taken: 8:14 AM, Height: 61 in, Source: Stated, Weight: 186 lbs, Source: Stated, BMI: 35.1, Temperature: 98.2 F, Pulse: 116 bpm, Respiratory Rate: 17 breaths/min, Blood Pressure: 150/81 mmHg. Cardiovascular 2+ dorsalis pedis/posterior tibialis pulses. Psychiatric pleasant and cooperative. General Notes: T the lateral aspect of the left lower extremity there are 2 open wounds with nonviable surface throughout. 2+ pitting edema to the knee. o Obvious deformity to the foot From previous surgeries. No signs of surrounding infection. Integumentary (Hair, Skin) Wound #4 status is Open. Original cause of wound was Gradually Appeared. The date acquired was: 04/19/2022. The wound is located on the Left,Proximal,Anterior Lower Leg. The wound measures 3cm length x 1.8cm width x 0.3cm depth; 4.241cm^2 area and 1.272cm^3 volume. There is Fat Layer (Subcutaneous Tissue) exposed. There is no tunneling or undermining noted. There is a medium amount of serosanguineous drainage noted. The wound margin is distinct with the outline attached to the wound base. There is small (1-33%) red, pink granulation within the wound bed. There is a large (67-100%) amount of necrotic tissue within the wound bed including Adherent  Slough. The periwound skin appearance did not exhibit: Callus, Crepitus, Excoriation, Induration, Rash, Scarring, Dry/Scaly, Maceration, Atrophie Blanche, Cyanosis, Ecchymosis, Hemosiderin Staining, Mottled, Pallor, Rubor, Erythema. Periwound temperature was noted as No Abnormality. The periwound has tenderness on palpation. Wound #5 status is Open. Original cause of wound was Gradually Appeared. The date  acquired was: 04/19/2022. The wound is located on the [facility] Lower Leg. The wound measures 5.5cm length x 3.6cm width x 0.1cm depth; 15.551cm^2 area and 1.555cm^3 volume. There is Fat Layer (Subcutaneous Tissue) exposed. There is no tunneling or undermining noted. There is a medium amount of serosanguineous drainage noted. The wound margin is distinct with the outline attached to the wound base. There is no granulation within the wound bed. There is a large (67-100%) amount of necrotic tissue within the wound bed including Eschar and Adherent Slough. The periwound skin appearance did not exhibit: Callus, Crepitus, Excoriation, Induration, Rash, Scarring, Dry/Scaly, Maceration, Atrophie Blanche, Cyanosis, Ecchymosis, Hemosiderin Staining, Mottled, Pallor, Rubor, Erythema. Periwound temperature was noted as No Abnormality. The periwound has tenderness on palpation. Assessment Active Problems ICD-10 Non-pressure chronic ulcer of other part of left lower leg with other specified severity Chronic venous hypertension (idiopathic) with ulcer of left lower extremity Interstitial pulmonary disease, unspecified Rheumatoid arthritis, unspecified REMY, DIA ([id number]) [id number] Page 8 of 11 Patient presents with a 1 to 81-monthhistory of nonhealing ulcer to the left lower extremity secondary to cellulitis and blistering and slow to heal due to venous insufficiency and rheumatoid arthritis as she is on chronic prednisone and leflunomide. She was hospitalized for this issue and required 3 weeks of antibiotics. Currently no signs of cellulitis on exam. I debrided nonviable tissue. We discussed the importance of swelling control for her wound healing. We discussed in office wraps versus compression stockings. She declined both. She states she does not want these at this time. I offered her Tubigrip. She was agreeable to this. I recommended Medihoney and Hydrofera  Blue to the wound bed daily. I recommend she elevate her legs. Procedures Wound #5 Pre-procedure diagnosis of Wound #5 is a Cellulitis located on the Left,Distal,Anterior Lower Leg . There was a Excisional Skin/Subcutaneous Tissue Debridement with a total area of 19.8 sq cm performed by HKalman Shan [name]. With the following instrument([name]): Curette to remove Viable and Non-Viable tissue/material. Material removed includes Subcutaneous Tissue and Slough and after achieving pain control using Lidocaine 5% topical ointment. A time out was conducted at 08:45, prior to the start of the procedure. A Minimum amount of bleeding was controlled with Pressure. The procedure was tolerated well with a pain level of 0 throughout and a pain level of 0 following the procedure. Post Debridement Measurements: 5.5cm length x 3.6cm width x 0.3cm depth; 4.665cm^3 volume. Character of Wound/Ulcer Post Debridement requires further debridement. Post procedure Diagnosis Wound #5: Same as Pre-Procedure Plan Follow-up Appointments: Return Appointment in 2 weeks. - Dr. [place]ber [place] Monday or Tuesday afternoon Anesthetic: (In clinic) Topical Lidocaine 5% applied to wound bed Bathing/ Shower/ Hygiene: May shower with protection but [name] not get wound dressing([name]) wet. Edema Control - Lymphedema / SCD / Other: Elevate legs to the level of the heart or above for 30 minutes daily and/or when sitting, a frequency of: - 3-4 times a day throughout the day. Avoid standing for long periods of time. The following medication([name]) was prescribed: lidocaine topical 5 % cream cream topical applied only in clinic for any debridements was prescribed at facility WOUND #4: - Lower Leg Wound  Laterality: Left, Anterior, Proximal Cleanser: Wound Cleanser (DME) (Generic) 1 x Per Day/30 Days Discharge Instructions: Cleanse the wound with wound cleanser prior to applying a clean dressing using gauze sponges, not tissue or cotton  balls. Prim Dressing: Hydrofera Blue Ready Foam, 4x5 in (DME) (Generic) 1 x Per Day/30 Days ary Discharge Instructions: Apply to wound bed as instructed Prim Dressing: MediHoney Gel, tube 1.5 (oz) 1 x Per Day/30 Days ary Discharge Instructions: Apply to wound bed as instructed Secondary Dressing: ABD Pad, 8x10 1 x Per Day/30 Days Discharge Instructions: Apply over primary dressing as directed. Secured With: [employer], 4.5x3.1 (in/yd) (DME) (Generic) 1 x Per Day/30 Days Discharge Instructions: Secure with Kerlix as directed. Secured With: 68M Medipore H Soft Cloth Surgical T ape, 4 x 10 (in/yd) (DME) (Generic) 1 x Per Day/30 Days Discharge Instructions: Secure with tape as directed. Com pression Wrap: TUBIGRIP SIZE E 1 x Per Day/30 Days Discharge Instructions: apply in the morning and remove at night. ONE LAYER [name] NOT DOUBLE!!! WOUND #5: - Lower Leg Wound Laterality: Left, Anterior, Distal Cleanser: Wound Cleanser (DME) (Generic) 1 x Per Day/30 Days Discharge Instructions: Cleanse the wound with wound cleanser prior to applying a clean dressing using gauze sponges, not tissue or cotton balls. Prim Dressing: Hydrofera Blue Ready Foam, 4x5 in (DME) (Generic) 1 x Per Day/30 Days ary Discharge Instructions: Apply to wound bed as instructed Prim Dressing: MediHoney Gel, tube 1.5 (oz) 1 x Per Day/30 Days ary Discharge Instructions: Apply to wound bed as instructed Secondary Dressing: ABD Pad, 8x10 1 x Per Day/30 Days Discharge Instructions: Apply over primary dressing as directed. Secured With: [employer], 4.5x3.1 (in/yd) (DME) (Generic) 1 x Per Day/30 Days Discharge Instructions: Secure with Kerlix as directed. Secured With: 68M Medipore H Soft Cloth Surgical T ape, 4 x 10 (in/yd) (DME) (Generic) 1 x Per Day/30 Days Discharge Instructions: Secure with tape as directed. Com pression Wrap: TUBIGRIP SIZE E 1 x Per Day/30 Days Discharge Instructions: apply in the morning and  remove at night. ONE LAYER [name] NOT DOUBLE!!! 1. In office sharp debridement 2. Tubigrip 3. Medihoney and Hydrofera Blue 4. Follow-up in 1 to 2 weeks Electronic Signature([name]) Signed: 06/03/2022 10:08:50 AM By: Kalman Shan [name] Entered By: Kalman Shan on 06/03/2022 09:30:56 [name] ([id number]) [id number] Page 9 of 11 -------------------------------------------------------------------------------- HxROS Details Patient Name: Date of Service: [name]. 06/03/2022 8:00 A M Medical Record Number: [id number] Patient Account Number: [id number] Date of Birth/Sex: Treating RN: May 03, 1946 (76 y.o. [name] Primary Care Provider: Lorene Dy Other Clinician: Referring Provider: Treating Provider/Extender: [name] in Treatment: 0 Information Obtained From Patient Eyes Medical History: Negative for: Cataracts; Optic Neuritis Ear/Nose/Mouth/Throat Medical History: Negative for: Chronic sinus problems/congestion Hematologic/Lymphatic Medical History: Negative for: Anemia; Hemophilia; Human Immunodeficiency Virus; Lymphedema; Sickle Cell Disease Respiratory Medical History: Positive for: Asthma; Sleep Apnea Negative for: Aspiration; Chronic Obstructive Pulmonary Disease (COPD); Pneumothorax; Tuberculosis Past Medical History Notes: 4L [place] 02 pulmonary fibrosis Cardiovascular Medical History: Positive for: Hypertension Negative for: Angina; Arrhythmia; Congestive Heart Failure; Coronary Artery Disease; Deep Vein Thrombosis; Hypotension; Myocardial Infarction; Peripheral Arterial Disease; Peripheral Venous Disease; Phlebitis; Vasculitis Gastrointestinal Medical History: Positive for: Colitis Negative for: Cirrhosis ; Crohns; Hepatitis A; Hepatitis B; Hepatitis C Endocrine Medical History: Negative for: Type I Diabetes; Type II Diabetes Immunological Medical History: Negative for: Lupus  Erythematosus; Raynauds; Scleroderma Integumentary (Skin) Medical History: Negative for: History of Burn Musculoskeletal Medical History: Positive  for: Rheumatoid Arthritis; Osteoarthritis Negative for: Gout; Osteomyelitis Neurologic LICHELLE, VIETS ([id number]) [id number] Page 10 of 11 Medical History: Negative for: Dementia; Neuropathy; Quadriplegia; Paraplegia; Seizure Disorder Oncologic Medical History: Negative for: Received Chemotherapy; Received Radiation Psychiatric Medical History: Negative for: Anorexia/bulimia; Confinement Anxiety Immunizations Pneumococcal Vaccine: Received Pneumococcal Vaccination: Yes Received Pneumococcal Vaccination On or After 60th Birthday: Yes Implantable Devices No devices added Hospitalization / Surgery History Type of Hospitalization/Surgery surgery toe right hip replacement 12/2018 heart cath 12/17/2021 Family and Social History Cancer: Yes - Maternal Grandparents; Diabetes: Yes - Siblings; Heart Disease: Yes - Maternal Grandparents,Paternal Grandparents,Mother,Father,Siblings; Hereditary Spherocytosis: No; Hypertension: Yes - Mother,Father,Siblings; Kidney Disease: No; Lung Disease: No; Seizures: No; Stroke: Yes - Paternal Grandparents; Thyroid Problems: No; Tuberculosis: No; Never smoker; Marital Status - Married; Alcohol Use: Moderate; Drug Use: No History; Caffeine Use: Moderate; Financial Concerns: No; Food, Clothing or Shelter Needs: No; Support System Lacking: No; Transportation Concerns: No Electronic Signature([name]) Signed: 06/03/2022 10:08:50 AM By: Kalman Shan [name] Signed: 06/03/2022 12:09:11 PM By: Rhae Hammock RN Signed: 06/03/2022 5:21:52 PM By: Deon Pilling RN, BSN Entered By: Rhae Hammock on 06/03/2022 08:16:25 -------------------------------------------------------------------------------- SuperBill Details Patient Name: Date of Service: [name]. 06/03/2022 Medical  Record Number: [id number] Patient Account Number: [id number] Date of Birth/Sex: Treating RN: September 28, 1945 (76 y.o. [name], [name] Primary Care Provider: Lorene Dy Other Clinician: Referring Provider: Treating Provider/Extender: [name] in Treatment: 0 Diagnosis Coding ICD-10 Codes Code Description [phone] Non-pressure chronic ulcer of other part of left lower leg with other specified severity I87.312 Chronic venous hypertension (idiopathic) with ulcer of left lower extremity J84.9 Interstitial pulmonary disease, unspecified M06.9 Rheumatoid arthritis, unspecified Facility Procedures : CPT4 Code: [id number] Description: 82658 - WOUND CARE VISIT-LEV 3 EST PT Modifier: Quantity: 1 : Fav[place], [name] CPT4 Code: [id number] Howard [name] ([id number] Description: 91456 - DEB SUBQ TISSUE 20 SQ CM/< ICD-10 Diagnosis Description L97.828 Non-pressure chronic ulcer of other part of left lower leg with other specified [name] 9) [id number] Modifier: everity [id number] Quantity: 1 [id number].pdf Page 11 of 11 Physician Procedures : CPT4 Code Description Modifier [id number] [phone] - WC PHYS LEVEL 4 - NEW PT ICD-10 Diagnosis Description L97.828 Non-pressure chronic ulcer of other part of left lower leg with other specified severity I87.312 Chronic venous hypertension (idiopathic) with  ulcer of left lower extremity J84.9 Interstitial pulmonary disease, unspecified M06.9 Rheumatoid arthritis, unspecified Quantity: 1 : [id number] 11042 - WC PHYS SUBQ TISS 20 SQ CM ICD-10 Diagnosis Description L97.828 Non-pressure chronic ulcer of other part of left lower leg with other specified severity Quantity: 1 Electronic Signature([name]) Signed: 06/03/2022 10:08:50 AM By: Kalman Shan [name] Entered By: Kalman Shan on 06/03/2022 09:31:17

## 2022-06-04 NOTE — Progress Notes (Signed)
TRESSIE, RAGIN ([id number]) [id number] Page 1 of 12 Visit Report for 06/03/2022 Allergy List Details Patient Name: Date of Service: Hailey Courser, DO [name] 06/03/2022 8:00 A M Medical Record Number: [id number] Patient Account Number: [id number] Date of Birth/Sex: Treating RN: [birth year] (76 y.o. [name], [name] Primary Care Len Azeez: Lorene Dy Other Clinician: Referring Renae Mottley: Treating Landy Dunnavant/Extender: [name] in Treatment: 0 Allergies Active Allergies cefdinir Reaction: diarrhea erythromycin base Reaction: diarrhea lactulose Reaction: diarrhea mesalamine Reaction: nausea nitrofuran derivative Reaction: "shakes" nitrofurantoin Reaction: "shakes" lettuce Reaction: diarrhea Sulfa (Sulfonamide Antibiotics) Reaction: unsure Adacel(Tdap Adolesn/Adult)(PF) Reaction: "shakes" tetanus toxoid, adsorbed Reaction: "shakes" Allergy Notes Electronic Signature(s) Signed: 06/03/2022 12:09:11 PM By: Rhae Hammock RN Entered By: Rhae Hammock on 06/03/2022 08:15:52 -------------------------------------------------------------------------------- Arrival Information Details Patient Name: Date of Service: Hailey Courser, DO [name] 06/03/2022 8:00 A M Medical Record Number: [id number] Patient Account Number: [id number] Date of Birth/Sex: Treating RN: 1945/11/21 (76 y.o. [name], Lauren Primary Care [name]: Lorene Dy Other Clinician: Referring Salma Walrond: Treating Willis Holquin/Extender: [name] in Treatment: 0 GILLIE, FLEITES ([id number]) [id number] Page 2 of 12 Visit Information Patient Arrived: Wheel Chair Arrival Time: 08:10 Accompanied By: husband Transfer Assistance: Manual Patient Identification Verified: Yes Secondary Verification Process Completed: Yes Patient Requires Transmission-Based Precautions: No Patient Has Alerts: Yes Patient  Alerts: ABI's: L: N/C 11/23 History Since Last Visit Added or deleted any medications: No Any new allergies or adverse reactions: No Had a fall or experienced change in activities of daily living that may affect risk of falls: No Signs or symptoms of abuse/neglect since last visito No Hospitalized since last visit: No Implantable device outside of the clinic excluding cellular tissue based products placed in the center since last visit: No Electronic Signature(s) Signed: 06/03/2022 12:09:11 PM By: Rhae Hammock RN Entered By: Rhae Hammock on 06/03/2022 08:38:40 -------------------------------------------------------------------------------- Clinic Level of Care Assessment Details Patient Name: Date of Service: [place]. 06/03/2022 8:00 A M Medical Record Number: [id number] Patient Account Number: [id number] Date of Birth/Sex: Treating RN: 09/15/1945 (76 y.o. [name], [name] Primary Care Bexleigh Theriault: Lorene Dy Other Clinician: Referring [name]: Treating Deaire Mcwhirter/Extender: [name] in Treatment: 0 Clinic Level of Care Assessment Items TOOL 1 Quantity Score X- 1 0 Use when EandM and Procedure is performed on INITIAL visit ASSESSMENTS - Nursing Assessment / Reassessment X- 1 20 General Physical Exam (combine w/ comprehensive assessment (listed just below) when performed on new pt. evals) X- 1 25 Comprehensive Assessment (HX, ROS, Risk Assessments, Wounds Hx, etc.) ASSESSMENTS - Wound and Skin Assessment / Reassessment X- 1 10 Dermatologic / Skin Assessment (not related to wound area) ASSESSMENTS - Ostomy and/or Continence Assessment and Care _0  - 0 Incontinence Assessment and Management _1  - 0 Ostomy Care Assessment and Management (repouching, etc.) PROCESS - Coordination of Care _2  - 0 Simple Patient / Family Education for ongoing care X- 1 20 Complex (extensive) Patient / Family Education for ongoing care X- 1 10 Staff  obtains Programmer, systems, Records, T Results / Process Orders est _3  - 0 Staff telephones HHA, Nursing Homes / Clarify orders / etc _4  - 0 Routine Transfer to another Facility (non-emergent condition) _5  - 0 Routine Hospital Admission (non-emergent condition) X- 1 15 New Admissions / Biomedical engineer / Ordering NPWT Apligraf, etc. , _6  - 0 Emergency Hospital Admission (emergent condition) PROCESS - Special Needs _7  - 0 Pediatric / Minor Patient Management _8  - 0 Isolation Patient Management _9  -  0 Hearing / Language / Visual special needs _0  - 0 Assessment of Community assistance (transportation, D/C planning, etc.) Hailey, [name] ([id number]) [id number] Page 3 of 12 _1  - 0 Additional assistance / Altered mentation _2  - 0 Support Surface(s) Assessment (bed, cushion, seat, etc.) INTERVENTIONS - Miscellaneous _3  - 0 External ear exam _4  - 0 Patient Transfer (multiple staff / Civil Service fast streamer / Similar devices) _5  - 0 Simple Staple / Suture removal (25 or less) _6  - 0 Complex Staple / Suture removal (26 or more) _7  - 0 Hypo/Hyperglycemic Management (do not check if billed separately) X- 1 15 Ankle / Brachial Index (ABI) - do not check if billed separately Has the patient been seen at the hospital within the last three years: Yes Total Score: 115 Level Of Care: New/Established - Level 3 Electronic Signature(s) Signed: 06/03/2022 5:21:52 PM By: Deon Pilling RN, BSN Entered By: Deon Pilling on 06/03/2022 08:57:49 -------------------------------------------------------------------------------- Encounter Discharge Information Details Patient Name: Date of Service: Hailey Courser, DO [name] 06/03/2022 8:00 A M Medical Record Number: [id number] Patient Account Number: [id number] Date of Birth/Sex: Treating RN: 1946-05-28 (76 y.o. [name], [name] Primary Care Maya Arcand: Lorene Dy Other Clinician: Referring Brylan Seubert: Treating Michiah Mudry/Extender:  [name] in Treatment: 0 Encounter Discharge Information Items Post Procedure Vitals Discharge Condition: Stable Temperature (F): 98.2 Ambulatory Status: Ambulatory Pulse (bpm): 116 Discharge Destination: Home Respiratory Rate (breaths/min): 20 Transportation: Private Auto Blood Pressure (mmHg): 150/81 Accompanied By: husband Schedule Follow-up Appointment: No Clinical Summary of Care: Electronic Signature(s) Signed: 06/03/2022 5:21:52 PM By: Deon Pilling RN, BSN Entered By: Deon Pilling on 06/03/2022 08:58:32 -------------------------------------------------------------------------------- Lower Extremity Assessment Details Patient Name: Date of Service: Hailey [name], DO [name] 06/03/2022 8:00 A M Medical Record Number: [id number] Patient Account Number: [id number] Date of Birth/Sex: Treating RN: Mar 09, 1946 (76 y.o. [name], Lauren Primary Care Doneshia Hill: Lorene Dy Other Clinician: Referring Kafi Dotter: Treating Chen Holzman/Extender: [name] in Treatment: 0 Edema Assessment Assessed: [name]: Yes] [name]: No] Edema: [Left: Ye] [name]: s] C[LeftNYAH, SHEPHERD ([id number])] [Right: [id number] Page 4 of 12] Calf Left: Right: Point of Measurement: 29 cm From Medial Instep 31.5 cm Ankle Left: Right: Point of Measurement: 9 cm From Medial Instep 25 cm Knee To Floor Left: Right: From Medial Instep 38 cm Vascular Assessment Pulses: Dorsalis Pedis Palpable: [Left:Yes] Posterior Tibial Palpable: [Left:Yes] Notes ABI's: N/C Electronic Signature(s) Signed: 06/03/2022 12:09:11 PM By: Rhae Hammock RN Entered By: Rhae Hammock on 06/03/2022 08:38:16 -------------------------------------------------------------------------------- Multi Wound Chart Details Patient Name: Date of Service: Hailey Courser, DO [name] 06/03/2022 8:00 A M Medical Record Number: [id number] Patient Account  Number: [id number] Date of Birth/Sex: Treating RN: Dec 19, 1945 (76 y.o. F) Primary Care Aleni Andrus: Lorene Dy Other Clinician: Referring Tyreona Panjwani: Treating Petrea Fredenburg/Extender: [name] in Treatment: 0 Vital Signs Height(in): 61 Pulse(bpm): 116 Weight(lbs): 186 Blood Pressure(mmHg): 150/81 Body Mass Index(BMI): 35.1 Temperature(F): 98.2 Respiratory Rate(breaths/min): 17 [4:Photos:] [N/A:N/A] Left, Proximal, Anterior Lower Leg Left, Distal, Anterior Lower Leg N/A Wound Location: Gradually Appeared Gradually Appeared N/A Wounding Event: Cellulitis Cellulitis N/A Primary Etiology: Asthma, Sleep Apnea, Hypertension, Asthma, Sleep Apnea, Hypertension, N/A Comorbid History: Colitis, Rheumatoid Arthritis, Colitis, Rheumatoid Arthritis, Osteoarthritis Osteoarthritis 04/19/2022 04/19/2022 N/A Date Acquired: 0 0 N/A Weeks of Treatment: Open Open N/A Wound Status: No No N/A Wound Recurrence: 3x1.8x0.3 5.5x3.6x0.1 N/A Measurements L x W x D (cm) YESSENIA, MAILLET ([id number]) [id number] Page 5 of 12 4.241 15.551 N/A A (cm) : rea  1.272 1.555 N/A Volume (cm) : Full Thickness With Exposed Support Full Thickness With Exposed Support N/A Classification: Structures Structures Medium Medium N/A Exudate A mount: Serosanguineous Serosanguineous N/A Exudate Type: red, brown red, brown N/A Exudate Color: Distinct, outline attached Distinct, outline attached N/A Wound Margin: Small (1-33%) None Present (0%) N/A Granulation A mount: Red, Pink N/A N/A Granulation Quality: Large (67-100%) Large (67-100%) N/A Necrotic A mount: Adherent Slough Eschar, Adherent Slough N/A Necrotic Tissue: Fat Layer (Subcutaneous Tissue): Yes Fat Layer (Subcutaneous Tissue): Yes N/A Exposed Structures: Fascia: No Fascia: No Tendon: No Tendon: No Muscle: No Muscle: No Joint: No Joint: No Bone: No Bone: No None None  N/A Epithelialization: Chemical/Enzymatic/Mechanical Debridement - Excisional N/A Debridement: Pre-procedure Verification/Time Out N/A 08:45 N/A Taken: N/A Lidocaine 5% topical ointment N/A Pain Control: N/A Subcutaneous, Slough N/A Tissue Debrided: N/A Skin/Subcutaneous Tissue N/A Level: N/A 19.8 N/A Debridement A (sq cm): rea N/A Curette N/A Instrument: None Minimum N/A Bleeding: N/A Pressure N/A Hemostasis A chieved: N/A 0 N/A Procedural Pain: N/A 0 N/A Post Procedural Pain: Procedure was tolerated well Procedure was tolerated well N/A Debridement Treatment Response: 3x1.8x0.3 5.5x3.6x0.3 N/A Post Debridement Measurements L x W x D (cm) 1.272 4.665 N/A Post Debridement Volume: (cm) Excoriation: No Excoriation: No N/A Periwound Skin Texture: Induration: No Induration: No Callus: No Callus: No Crepitus: No Crepitus: No Rash: No Rash: No Scarring: No Scarring: No Maceration: No Maceration: No N/A Periwound Skin Moisture: Dry/Scaly: No Dry/Scaly: No Atrophie Blanche: No Atrophie Blanche: No N/A Periwound Skin Color: Cyanosis: No Cyanosis: No Ecchymosis: No Ecchymosis: No Erythema: No Erythema: No Hemosiderin Staining: No Hemosiderin Staining: No Mottled: No Mottled: No Pallor: No Pallor: No Rubor: No Rubor: No No Abnormality No Abnormality N/A Temperature: Yes Yes N/A Tenderness on Palpation: N/A Debridement N/A Procedures Performed: Treatment Notes Wound #4 (Lower Leg) Wound Laterality: Left, Anterior, Proximal Cleanser Wound Cleanser Discharge Instruction: Cleanse the wound with wound cleanser prior to applying a clean dressing using gauze sponges, not tissue or cotton balls. Peri-Wound Care Topical Primary Dressing Hydrofera Blue Ready Foam, 4x5 in Discharge Instruction: Apply to wound bed as instructed MediHoney Gel, tube 1.5 (oz) Discharge Instruction: Apply to wound bed as instructed Secondary Dressing ABD Pad,  8x10 Discharge Instruction: Apply over primary dressing as directed. Secured With [employer], 4.5x3.1 (in/yd) Discharge Instruction: Secure with Kerlix as directed. 28M Medipore H Soft Cloth Surgical T ape, 4 x 10 (in/yd) Discharge Instruction: Secure with tape as directed. ALIYANA, DLUGOSZ ([id number]) [id number] Page 6 of 12 Compression Wrap TUBIGRIP SIZE E Discharge Instruction: apply in the morning and remove at night. ONE LAYER DO NOT DOUBLE!!! Compression Stockings Add-Ons Wound #5 (Lower Leg) Wound Laterality: Left, Anterior, Distal Cleanser Wound Cleanser Discharge Instruction: Cleanse the wound with wound cleanser prior to applying a clean dressing using gauze sponges, not tissue or cotton balls. Peri-Wound Care Topical Primary Dressing Hydrofera Blue Ready Foam, 4x5 in Discharge Instruction: Apply to wound bed as instructed MediHoney Gel, tube 1.5 (oz) Discharge Instruction: Apply to wound bed as instructed Secondary Dressing ABD Pad, 8x10 Discharge Instruction: Apply over primary dressing as directed. Secured With [employer], 4.5x3.1 (in/yd) Discharge Instruction: Secure with Kerlix as directed. 28M Medipore H Soft Cloth Surgical T ape, 4 x 10 (in/yd) Discharge Instruction: Secure with tape as directed. Compression Wrap TUBIGRIP SIZE E Discharge Instruction: apply in the morning and remove at night. ONE LAYER DO NOT DOUBLE!!! Compression Stockings Add-Ons Electronic Signature(s) Signed: 06/03/2022 10:08:50 AM By: Kalman Shan  DO Entered By: Kalman Shan on 06/03/2022 09:05:16 -------------------------------------------------------------------------------- Multi-Disciplinary Care Plan Details Patient Name: Date of Service: Hailey Courser, DO [name] 06/03/2022 8:00 A M Medical Record Number: [id number] Patient Account Number: [id number] Date of Birth/Sex: Treating RN: 02-21-1946 (76 y.o. [name] Primary Care  Kathlene Yano: Lorene Dy Other Clinician: Referring Tyshaun Vinzant: Treating Gearold Wainer/Extender: [name] in Treatment: 0 Active Inactive Orientation to the Wound Care Program Nursing Diagnoses: Knowledge deficit related to the wound healing center program Goals: CONDA, WANNAMAKER ([id number]) [id number] Page 7 of 12 Patient/caregiver will verbalize understanding of the [place] Program Date Initiated: 06/03/2022 Target Resolution Date: 06/24/2022 Goal Status: Active Interventions: Provide education on orientation to the wound center Notes: Pain, Acute or Chronic Nursing Diagnoses: Pain, acute or chronic: actual or potential Potential alteration in comfort, pain Goals: Patient will verbalize adequate pain control and receive pain control interventions during procedures as needed Date Initiated: 06/03/2022 Target Resolution Date: 06/24/2022 Goal Status: Active Patient/caregiver will verbalize comfort level met Date Initiated: 06/03/2022 Target Resolution Date: 06/23/2022 Goal Status: Active Interventions: Encourage patient to take pain medications as prescribed Provide education on pain management Treatment Activities: Administer pain control measures as ordered : 06/03/2022 Notes: Venous Leg Ulcer Nursing Diagnoses: Potential for venous Insuffiency (use before diagnosis confirmed) Goals: Patient will maintain optimal edema control Date Initiated: 06/03/2022 Target Resolution Date: 06/24/2022 Goal Status: Active Interventions: Assess peripheral edema status every visit. Compression as ordered Provide education on venous insufficiency Treatment Activities: Therapeutic compression applied : 06/03/2022 Notes: Electronic Signature(s) Signed: 06/03/2022 5:21:52 PM By: Deon Pilling RN, BSN Entered By: Deon Pilling on 06/03/2022  08:48:34 -------------------------------------------------------------------------------- Pain Assessment Details Patient Name: Date of Service: Hailey Courser, DO [name] 06/03/2022 8:00 A M Medical Record Number: [id number] Patient Account Number: [id number] Date of Birth/Sex: Treating RN: [birth year] (76 y.o. [name], Lauren Primary Care Toree Edling: Lorene Dy Other Clinician: Referring Jeanetta Alonzo: Treating Raeley Gilmore/Extender: [name] in Treatment: 0 Active Problems Location of Pain Severity and Description of Pain Patient Has Paino No VANIAH, CHAMBERS ([id number]) [id number] Page 8 of 12 Patient Has Paino No Site Locations Pain Management and Medication Current Pain Management: Electronic Signature(s) Signed: 06/03/2022 12:09:11 PM By: Rhae Hammock RN Entered By: Rhae Hammock on 06/03/2022 08:13:44 -------------------------------------------------------------------------------- Patient/Caregiver Education Details Patient Name: Date of Service: Hailey [name], DO [name] 11/17/2023andnbsp8:00 A M Medical Record Number: [id number] Patient Account Number: [id number] Date of Birth/Gender: Treating RN: 1946-03-01 (76 y.o. [name] Primary Care Physician: Lorene Dy Other Clinician: Referring Physician: Treating Physician/Extender: [name] in Treatment: 0 Education Assessment Education Provided To: Patient Education Topics Provided Pain: Handouts: A Guide to Pain Control Methods: Explain/Verbal Responses: Reinforcements needed Electronic Signature(s) Signed: 06/03/2022 5:21:52 PM By: Deon Pilling RN, BSN Entered By: Deon Pilling on 06/03/2022 08:48:43 -------------------------------------------------------------------------------- Wound Assessment Details Patient Name: Date of Service: [place]. 06/03/2022 8:00 A M Medical Record Number: [id number] Patient  Account Number: [id number] VEGA, STARE ([id number]) [id number] Page 9 of 12 Date of Birth/Sex: Treating RN: June 26, 1946 (76 y.o. [name], Lauren Primary Care Jeramiah Mccaughey: Other Clinician: Lorene Dy Referring Adrienne Trombetta: Treating Trude Cansler/Extender: [name] in Treatment: 0 Wound Status Wound Number: 4 Primary Cellulitis Etiology: Wound Location: Left, Proximal, Anterior Lower Leg Wound Open Wounding Event: Gradually Appeared Status: Date Acquired: 04/19/2022 Comorbid Asthma, Sleep Apnea, Hypertension, Colitis, Rheumatoid Weeks Of Treatment: 0 History: Arthritis, Osteoarthritis Clustered Wound:  No Photos Wound Measurements Length: (cm) 3 Width: (cm) 1.8 Depth: (cm) 0.3 Area: (cm) 4.241 Volume: (cm) 1.272 % Reduction in Area: % Reduction in Volume: Epithelialization: None Tunneling: No Undermining: No Wound Description Classification: Full Thickness With Exposed Support Structures Wound Margin: Distinct, outline attached Exudate Amount: Medium Exudate Type: Serosanguineous Exudate Color: red, brown Foul Odor After Cleansing: No Slough/Fibrino Yes Wound Bed Granulation Amount: Small (1-33%) Exposed Structure Granulation Quality: Red, Pink Fascia Exposed: No Necrotic Amount: Large (67-100%) Fat Layer (Subcutaneous Tissue) Exposed: Yes Necrotic Quality: Adherent Slough Tendon Exposed: No Muscle Exposed: No Joint Exposed: No Bone Exposed: No Periwound Skin Texture Texture Color No Abnormalities Noted: No No Abnormalities Noted: No Callus: No Atrophie Blanche: No Crepitus: No Cyanosis: No Excoriation: No Ecchymosis: No Induration: No Erythema: No Rash: No Hemosiderin Staining: No Scarring: No Mottled: No Pallor: No Moisture Rubor: No No Abnormalities Noted: No Dry / Scaly: No Temperature / Pain Maceration: No Temperature: No Abnormality Tenderness on Palpation: Yes Treatment  Notes Wound #4 (Lower Leg) Wound Laterality: Left, Anterior, Proximal Cleanser Wound Cleanser Discharge Instruction: Cleanse the wound with wound cleanser prior to applying a clean dressing using gauze sponges, not tissue or cotton balls. MAICEE, ULLMAN ([id number]) [id number] Page 10 of 12 Peri-Wound Care Topical Primary Dressing Hydrofera Blue Ready Foam, 4x5 in Discharge Instruction: Apply to wound bed as instructed MediHoney Gel, tube 1.5 (oz) Discharge Instruction: Apply to wound bed as instructed Secondary Dressing ABD Pad, 8x10 Discharge Instruction: Apply over primary dressing as directed. Secured With [employer], 4.5x3.1 (in/yd) Discharge Instruction: Secure with Kerlix as directed. 1M Medipore H Soft Cloth Surgical T ape, 4 x 10 (in/yd) Discharge Instruction: Secure with tape as directed. Compression Wrap TUBIGRIP SIZE E Discharge Instruction: apply in the morning and remove at night. ONE LAYER DO NOT DOUBLE!!! Compression Stockings Add-Ons Electronic Signature(s) Signed: 06/03/2022 12:09:11 PM By: Rhae Hammock RN Signed: 06/03/2022 5:21:52 PM By: Deon Pilling RN, BSN Entered By: Deon Pilling on 06/03/2022 08:28:09 -------------------------------------------------------------------------------- Wound Assessment Details Patient Name: Date of Service: Hailey Courser, DO [name] 06/03/2022 8:00 A M Medical Record Number: [id number] Patient Account Number: [id number] Date of Birth/Sex: Treating RN: 1945/07/27 (76 y.o. [name], Lauren Primary Care Amaura Authier: Lorene Dy Other Clinician: Referring Lason Eveland: Treating Tammie Ellsworth/Extender: [name] in Treatment: 0 Wound Status Wound Number: 5 Primary Cellulitis Etiology: Wound Location: Left, Distal, Anterior Lower Leg Wound Open Wounding Event: Gradually Appeared Status: Date Acquired: 04/19/2022 Comorbid Asthma, Sleep Apnea, Hypertension,  Colitis, Rheumatoid Weeks Of Treatment: 0 History: Arthritis, Osteoarthritis Clustered Wound: No Photos Wound Measurements Length: (cm) 5.5 LYNNEX, FULP ([id number]) Width: (cm) 3.6 Depth: (cm) 0.1 Area: (cm) 15.551 Volume: (cm) 1.555 % Reduction in Area: [id number] Page 11 of 12 % Reduction in Volume: Epithelialization: None Tunneling: No Undermining: No Wound Description Classification: Full Thickness With Exposed Support Structures Wound Margin: Distinct, outline attached Exudate Amount: Medium Exudate Type: Serosanguineous Exudate Color: red, brown Foul Odor After Cleansing: No Slough/Fibrino Yes Wound Bed Granulation Amount: None Present (0%) Exposed Structure Necrotic Amount: Large (67-100%) Fascia Exposed: No Necrotic Quality: Eschar, Adherent Slough Fat Layer (Subcutaneous Tissue) Exposed: Yes Tendon Exposed: No Muscle Exposed: No Joint Exposed: No Bone Exposed: No Periwound Skin Texture Texture Color No Abnormalities Noted: No No Abnormalities Noted: No Callus: No Atrophie Blanche: No Crepitus: No Cyanosis: No Excoriation: No Ecchymosis: No Induration: No Erythema: No Rash: No Hemosiderin Staining: No Scarring: No Mottled: No Pallor: No Moisture Rubor: No No Abnormalities  Noted: No Dry / Scaly: No Temperature / Pain Maceration: No Temperature: No Abnormality Tenderness on Palpation: Yes Treatment Notes Wound #5 (Lower Leg) Wound Laterality: Left, Anterior, Distal Cleanser Wound Cleanser Discharge Instruction: Cleanse the wound with wound cleanser prior to applying a clean dressing using gauze sponges, not tissue or cotton balls. Peri-Wound Care Topical Primary Dressing Hydrofera Blue Ready Foam, 4x5 in Discharge Instruction: Apply to wound bed as instructed MediHoney Gel, tube 1.5 (oz) Discharge Instruction: Apply to wound bed as instructed Secondary Dressing ABD Pad, 8x10 Discharge Instruction: Apply  over primary dressing as directed. Secured With [employer], 4.5x3.1 (in/yd) Discharge Instruction: Secure with Kerlix as directed. 63M Medipore H Soft Cloth Surgical T ape, 4 x 10 (in/yd) Discharge Instruction: Secure with tape as directed. Compression Wrap TUBIGRIP SIZE E Discharge Instruction: apply in the morning and remove at night. ONE LAYER DO NOT DOUBLE!!! Compression Stockings Add-Ons Electronic Signature(s) Signed: 06/03/2022 12:09:11 PM By: Rhae Hammock RN Lacinda Axon, [place] ([id number]) [id number] Page 12 of 12 Signed: 06/03/2022 5:21:52 PM By: Deon Pilling RN, BSN Entered By: Deon Pilling on 06/03/2022 09:23:30 -------------------------------------------------------------------------------- Vitals Details Patient Name: Date of Service: Hailey [name], DO [name] 06/03/2022 8:00 A M Medical Record Number: [id number] Patient Account Number: [id number] Date of Birth/Sex: Treating RN: 21-Jun-1946 (76 y.o. [name], Lauren Primary Care Jakeim Sedore: Lorene Dy Other Clinician: Referring Layten Aiken: Treating Taquila Leys/Extender: [name] in Treatment: 0 Vital Signs Time Taken: 08:14 Temperature (F): 98.2 Height (in): 61 Pulse (bpm): 116 Source: Stated Respiratory Rate (breaths/min): 17 Weight (lbs): 186 Blood Pressure (mmHg): 150/81 Source: Stated Reference Range: 80 - 120 mg / dl Body Mass Index (BMI): 35.1 Airway Inhaled Oxygen Concentration (%): 7 Electronic Signature(s) Signed: 06/03/2022 12:09:11 PM By: Rhae Hammock RN Entered By: Rhae Hammock on 06/03/2022 08:15:27

## 2022-06-06 ENCOUNTER — Ambulatory Visit (INDEPENDENT_AMBULATORY_CARE_PROVIDER_SITE_OTHER): Payer: Medicare Other | Admitting: Internal Medicine

## 2022-06-06 ENCOUNTER — Other Ambulatory Visit: Payer: Self-pay | Admitting: *Deleted

## 2022-06-06 ENCOUNTER — Other Ambulatory Visit: Payer: Self-pay

## 2022-06-06 ENCOUNTER — Ambulatory Visit: Payer: Medicare Other | Admitting: Internal Medicine

## 2022-06-06 VITALS — BP 155/70 | HR 97 | Temp 98.6°F

## 2022-06-06 DIAGNOSIS — L03116 Cellulitis of left lower limb: Secondary | ICD-10-CM | POA: Diagnosis present

## 2022-06-06 DIAGNOSIS — J849 Interstitial pulmonary disease, unspecified: Secondary | ICD-10-CM

## 2022-06-06 NOTE — Patient Instructions (Signed)
  F/U PRN If concern for cellulitis then call clinic will  plan to see in clinic and Rx doxy+ augmentin x 7days, with zofran 1/2 hour prior.

## 2022-06-06 NOTE — Progress Notes (Unsigned)
Patient Active Problem List   Diagnosis Date Noted   Wound of left leg 05/17/2022   Medication monitoring encounter 05/17/2022   Acute on chronic diastolic CHF (congestive heart failure) ([place]) 04/22/2022   Hypokalemia 04/22/2022   Petechial rash 04/21/2022   Leukocytosis 04/21/2022   Cellulitis of left lower extremity 04/21/2022   Elevated troponin 04/21/2022   Chronic heart failure with preserved ejection fraction ([place]) 04/20/2022   Mild persistent asthma without complication 06/53/9908   Therapeutic drug monitoring 10/02/2019   Chronic respiratory failure with hypoxia ([place]) 03/07/2019   Physical deconditioning 02/11/2019   Obese 12/21/2018   S/P right THA, AA 12/20/2018   Pulmonary fibrosis ([place]) 10/12/2018   Chronic heart failure with preserved ejection fraction (HFpEF) ([place]) 10/12/2018   Acute on chronic respiratory failure with hypoxia ([place]) 08/29/2018   Bronchiectasis without complication ([place]) 52/11/507   Wound infection after surgery 05/09/2018   Hyperlipidemia LDL goal <70 01/09/2017   Ankle arthritis 12/19/2016   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/2016   Hallux valgus of left foot 12/19/2016   Painful orthopaedic hardware ([place]) 12/19/2016   Pes planus 12/19/2016   Subluxation of interphalangeal joint of lesser toe of left foot 12/19/2016   Pulmonary hypertension ([place]) 10/25/2016   Coronary artery disease involving native coronary artery of native heart without angina pectoris 10/25/2016   Shortness of breath 09/12/2016   Leg edema 09/12/2016   Obstructive sleep apnea 09/12/2016   ILD (interstitial lung disease) ([place]) 09/19/2015   Thrush, oral 06/12/2014   Influenza A with pneumonia 08/02/2013   CAP (community acquired pneumonia) 07/31/2013   Hypoxia 07/31/2013   Open wound of knee, leg (except thigh), and ankle, without mention of complication 18/59/9566   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/21/2009   Asthmatic bronchitis, moderate  persistent, uncomplicated 71/77/9564   RHINOSINUSITIS, CHRONIC 12/19/2008   ANXIETY DEPRESSION 03/25/2008   ASTHMATIC BRONCHITIS, ACUTE 05/12/2007   Seasonal and perennial allergic rhinitis 05/12/2007   REFLUX, ESOPHAGEAL 05/12/2007   Rheumatoid arthritis ([place]) 05/12/2007    Patient's Medications  New Prescriptions   No medications on file  Previous Medications   ALBUTEROL (VENTOLIN HFA) 108 (90 BASE) MCG/ACT INHALER    USE 2 INHALATIONS BY MOUTH  EVERY 6 HOURS AS NEEDED   AMOXICILLIN-CLAVULANATE (AUGMENTIN) 875-125 MG TABLET    Take 1 tablet by mouth 2 (two) times daily.   ASPIRIN EC 81 MG TABLET    Take 81 mg by mouth daily.   BENZONATATE (TESSALON) 200 MG CAPSULE    TAKE 1 CAPSULE (200 MG TOTAL) BY MOUTH 3 (THREE) TIMES DAILY AS NEEDED FOR COUGH.   CETIRIZINE (ZYRTEC) 10 MG TABLET    Take 20 mg by mouth daily.   CHOLECALCIFEROL (VITAMIN D3) 250 MCG (10000 UT) CAPSULE    Take 10,000 Units by mouth daily.   CLIDINIUM-CHLORDIAZEPOXIDE (LIBRAX) 5-2.5 MG CAPSULE    Take 1 capsule by mouth 3 (three) times daily as needed (for IBS).   COLESTIPOL (COLESTID) 1 G TABLET    Take 1 g by mouth daily.   DICYCLOMINE (BENTYL) 20 MG TABLET    Take 20 mg by mouth 4 (four) times daily as needed (IBS).   DOXYCYCLINE (VIBRAMYCIN) 100 MG CAPSULE    Take 100 mg by mouth 2 (two) times daily.   FLUOXETINE (PROZAC) 20 MG CAPSULE    Take 20 mg by mouth daily.   FLUTICASONE (FLONASE) 50 MCG/ACT NASAL SPRAY    SPRAY 2 SPRAYS INTO  EACH NOSTRIL EVERY DAY   FUROSEMIDE (LASIX) 40 MG TABLET    TAKE 1 TABLET BY MOUTH DAILY   IPRATROPIUM (ATROVENT) 0.03 % NASAL SPRAY    USE 1 TO 2 SPRAYS IN BOTH  NOSTRILS TWICE DAILY   IPRATROPIUM-ALBUTEROL (DUONEB) 0.5-2.5 (3) MG/3ML SOLN    USE 1 VIAL IN NEBULIZER EVERY 6 HOURS AS NEEDED J45.901   KLOR-CON M20 20 MEQ TABLET    TAKE 1 TABLET BY MOUTH TWICE A DAY   LEFLUNOMIDE (ARAVA) 20 MG TABLET    Take 1 tablet (20 mg total) by mouth daily.   LOPERAMIDE (IMODIUM) 2 MG CAPSULE     Take 1 capsule (2 mg total) by mouth 2 (two) times daily as needed for diarrhea or loose stools.   MENTHOL, TOPICAL ANALGESIC, (BIOFREEZE COOL THE PAIN) 4 % GEL    Apply 1 Application topically 2 (two) times daily as needed (pain).   METHOCARBAMOL (ROBAXIN) 750 MG TABLET    Take 750 mg by mouth 2 (two) times daily as needed for muscle spasms.   MONTELUKAST (SINGULAIR) 10 MG TABLET    TAKE 1 TABLET BY MOUTH AT  BEDTIME   MUPIROCIN OINTMENT (BACTROBAN) 2 %    Apply topically daily.   OMEPRAZOLE (PRILOSEC) 40 MG CAPSULE    Take 40 mg by mouth at bedtime.    OXYGEN    Inhale 7 L into the lungs continuous.   PIRFENIDONE (ESBRIET) 801 MG TABS    Take 801 mg by mouth 3 (three) times daily.   POLYETHYL GLYCOL-PROPYL GLYCOL (SYSTANE) 0.4-0.3 % GEL OPHTHALMIC GEL    Place 2 application  into both eyes 2 (two) times daily as needed (dry eyes).   PREDNISONE (DELTASONE) 5 MG TABLET    Take 5 mg by mouth daily with breakfast.   ROSUVASTATIN (CRESTOR) 20 MG TABLET    TAKE 1 TABLET BY MOUTH DAILY   SPIRONOLACTONE (ALDACTONE) 25 MG TABLET    Take 0.5 tablets (12.5 mg total) by mouth daily.   TOCILIZUMAB (ACTEMRA IV)    Inject into the vein every 30 (thirty) days. infusion   TRAMADOL (ULTRAM) 50 MG TABLET    Take 50 mg by mouth 3 (three) times daily as needed for moderate pain.   TRAZODONE (DESYREL) 100 MG TABLET    Take 100 mg by mouth at bedtime.   TRELEGY ELLIPTA 100-62.5-25 MCG/ACT AEPB    USE 1 INHALATION BY MOUTH DAILY   TYVASO DPI MAINTENANCE KIT 64 MCG POWD    Inhale 1 puff into the lungs in the morning, at noon, in the evening, and at bedtime.   VERAPAMIL HCL CR 300 MG CP24    Take 1 capsule (300 mg total) by mouth daily.  Modified Medications   No medications on file  Discontinued Medications   No medications on file    Subjective: 76 year old female with PMHX as below  presents for follow-up of lower extremity wound.  She is followed by Dr. West Bali (10/31) on Doxy and  Augmentin following  hospital discharge.  At that visit noted to have developed left upper anterior leg drainage.  Antibiotics were continued and patient recommended to follow-up closely with wound care.  She stopped taking abx due to nausea. She believes it was the Augmentin. Wound is no longer draining. She was seen by wound care. Review of Systems: Review of Systems  All other systems reviewed and are negative.   Past Medical History:  Diagnosis Date   Acute asthmatic bronchitis  Allergic rhinitis    Anxiety    Arthritis    Esophageal reflux    Hypertension    Interstitial lung disease ([place]) dx jan 2020   Irritable bowel syndrome    Oxygen dependent    4 liters day time 6 liters at night   PONV (postoperative nausea and vomiting)    ponv likes zofran, and scopolamine patch   Pulmonary hypertension (HCC)    Rheumatoid arthritis(714.0)    Sleep apnea     Social History   Tobacco Use   Smoking status: Never   Smokeless tobacco: Never   Tobacco comments:    positive passive tobacco smoke exposure  Vaping Use   Vaping Use: Never used  Substance Use Topics   Alcohol use: Not Currently    Alcohol/week: 7.0 standard drinks of alcohol    Types: 7 Standard drinks or equivalent per week    Comment: 1 alcohol drink each night   Drug use: No    Family History  Problem Relation Age of Onset   Heart disease Mother    Arthritis Mother    Heart attack Father    Diabetes Other        sibling   Heart attack Other        sibling    Allergies  Allergen Reactions   Cefdinir Diarrhea   Erythromycin Base Diarrhea   Lactose Dermatitis   Lactulose Diarrhea   Mesalamine Nausea Only   Methotrexate Other (See Comments)    Felt sick   Nitrofuran Derivatives     shakiness   Other Diarrhea and Other (See Comments)    Shaking uncontrollably "lettuce only" "lettuce only"   Sulfa Antibiotics Other (See Comments)    Patient can't recall reaction    Tdap [Tetanus-Diphth-Acell Pertussis]      Shaking uncontrollably   Tetanus Toxoid, Adsorbed Other (See Comments)    Shaking uncontrollable     Health Maintenance  Topic Date Due   Hepatitis C Screening  Never done   DEXA SCAN  Never done   Medicare Annual Wellness (AWV)  01/24/2017   Zoster Vaccines- Shingrix (2 of 2) 06/30/2017   COVID-19 Vaccine (4 - Pfizer risk series) 06/08/2020   INFLUENZA VACCINE  02/15/2022   Pneumonia Vaccine 74+ Years old  Completed   HPV VACCINES  Aged Out   COLONOSCOPY (Pts 45-78yr Insurance coverage will need to be confirmed)  Discontinued    Objective:  There were no vitals filed for this visit. There is no height or weight on file to calculate BMI.  Physical Exam Constitutional:      Appearance: Normal appearance.  HENT:     Head: Normocephalic and atraumatic.     Right Ear: Tympanic membrane normal.     Left Ear: Tympanic membrane normal.     Nose: Nose normal.     Mouth/Throat:     Mouth: Mucous membranes are moist.  Eyes:     Extraocular Movements: Extraocular movements intact.     Conjunctiva/sclera: Conjunctivae normal.     Pupils: Pupils are equal, round, and reactive to light.  Cardiovascular:     Rate and Rhythm: Normal rate and regular rhythm.     Heart sounds: No murmur heard.    No friction rub. No gallop.  Pulmonary:     Effort: Pulmonary effort is normal.     Breath sounds: Normal breath sounds.  Abdominal:     General: Abdomen is flat.     Palpations: Abdomen is soft.  Musculoskeletal:  General: Normal range of motion.  Skin:    General: Skin is warm and dry.  Neurological:     General: No focal deficit present.     Mental Status: She is alert and oriented to person, place, and time.  Psychiatric:        Mood and Affect: Mood normal.        Lab Results Lab Results  Component Value Date   WBC 7.6 05/17/2022   HGB 12.9 05/17/2022   HCT 38.9 05/17/2022   MCV 97.7 05/17/2022   PLT 239 05/17/2022    Lab Results  Component Value Date    CREATININE 0.70 05/17/2022   BUN 19 05/17/2022   NA 138 05/17/2022   K 3.8 05/17/2022   CL 101 05/17/2022   CO2 26 05/17/2022    Lab Results  Component Value Date   ALT 14 05/17/2022   AST 14 05/17/2022   ALKPHOS 67 04/20/2022   BILITOT 0.6 05/17/2022    Lab Results  Component Value Date   CHOL 135 09/02/2020   HDL 74 09/02/2020   LDLCALC 40 09/02/2020   LDLDIRECT 53 01/09/2017   TRIG 122 09/02/2020   CHOLHDL 1.8 09/02/2020   No results found for: "LABRPR", "RPRTITER" No results found for: "HIV1RNAQUANT", "HIV1RNAVL", "CD4TABS" A/P 76 year old female with a PMH ILD with oxygen dependence 7 L, pulmonary hypertension, RA on chronic prednisone and leflunomide, right THA and bilateral knee arthroplasty, s/p 6 surgeries in the left ankle with hardware here for LLE cellulitis.   # Left upper anterior leg wound in the setting of recent cellulitis  # Medication Monitoring  -Seen by wound care doctor at left on 06/03/2022 underwent debridement. Stopped antibiotics a few days after ID appointment due to nausea. She believes it was the Augmentin. ESR 2, CPR 0.3 on 1031.  -Wound look clean today, She is continuing wound care Plan: Continue wound care If pt has concern for cellulitis then call clinic,  plan to see in clinic and Rx doxy+ augmentin x 7days, with zofran 1/2 hour prior. Follow-up PRN    Laurice Record, MD [facility] 06/06/2022, 9:52 AM

## 2022-06-07 ENCOUNTER — Ambulatory Visit (INDEPENDENT_AMBULATORY_CARE_PROVIDER_SITE_OTHER): Payer: Medicare Other | Admitting: Internal Medicine

## 2022-06-07 ENCOUNTER — Encounter: Payer: Self-pay | Admitting: Internal Medicine

## 2022-06-07 ENCOUNTER — Encounter: Payer: Medicare Other | Admitting: Internal Medicine

## 2022-06-07 VITALS — BP 118/68 | HR 101 | Ht 60.5 in | Wt 187.2 lb

## 2022-06-07 DIAGNOSIS — I272 Pulmonary hypertension, unspecified: Secondary | ICD-10-CM | POA: Diagnosis not present

## 2022-06-07 DIAGNOSIS — J9611 Chronic respiratory failure with hypoxia: Secondary | ICD-10-CM | POA: Diagnosis not present

## 2022-06-07 DIAGNOSIS — Z7189 Other specified counseling: Secondary | ICD-10-CM

## 2022-06-07 DIAGNOSIS — Z79899 Other long term (current) drug therapy: Secondary | ICD-10-CM

## 2022-06-07 DIAGNOSIS — J849 Interstitial pulmonary disease, unspecified: Secondary | ICD-10-CM

## 2022-06-07 DIAGNOSIS — I2510 Atherosclerotic heart disease of native coronary artery without angina pectoris: Secondary | ICD-10-CM

## 2022-06-07 DIAGNOSIS — R0602 Shortness of breath: Secondary | ICD-10-CM

## 2022-06-07 DIAGNOSIS — Z5181 Encounter for therapeutic drug level monitoring: Secondary | ICD-10-CM

## 2022-06-07 DIAGNOSIS — Z006 Encounter for examination for normal comparison and control in clinical research program: Secondary | ICD-10-CM

## 2022-06-07 DIAGNOSIS — G4733 Obstructive sleep apnea (adult) (pediatric): Secondary | ICD-10-CM | POA: Diagnosis not present

## 2022-06-07 DIAGNOSIS — Z7185 Encounter for immunization safety counseling: Secondary | ICD-10-CM

## 2022-06-07 LAB — CBC WITH DIFFERENTIAL/PLATELET
Basophils Absolute: 0 10*3/uL (ref 0.0–0.1)
Basophils Relative: 0.3 % (ref 0.0–3.0)
Eosinophils Absolute: 0.1 10*3/uL (ref 0.0–0.7)
Eosinophils Relative: 2.7 % (ref 0.0–5.0)
HCT: 39 % (ref 36.0–46.0)
Hemoglobin: 12.8 g/dL (ref 12.0–15.0)
Lymphocytes Relative: 18.3 % (ref 12.0–46.0)
Lymphs Abs: 1 10*3/uL (ref 0.7–4.0)
MCHC: 32.8 g/dL (ref 30.0–36.0)
MCV: 98.6 fl (ref 78.0–100.0)
Monocytes Absolute: 0.7 10*3/uL (ref 0.1–1.0)
Monocytes Relative: 14.1 % — ABNORMAL HIGH (ref 3.0–12.0)
Neutro Abs: 3.4 10*3/uL (ref 1.4–7.7)
Neutrophils Relative %: 64.6 % (ref 43.0–77.0)
Platelets: 269 10*3/uL (ref 150.0–400.0)
RBC: 3.95 Mil/uL (ref 3.87–5.11)
RDW: 13.8 % (ref 11.5–15.5)
WBC: 5.3 10*3/uL (ref 4.0–10.5)

## 2022-06-07 LAB — PULMONARY FUNCTION TEST
DL/VA % pred: 67 %
DL/VA: 2.82 ml/min/mmHg/L
DLCO cor % pred: 36 %
DLCO cor: 6.28 ml/min/mmHg
DLCO unc % pred: 36 %
DLCO unc: 6.18 ml/min/mmHg
FEF 25-75 Pre: 0.9 L/sec
FEF2575-%Pred-Pre: 67 %
FEV1-%Pred-Pre: 65 %
FEV1-Pre: 1.13 L
FEV1FVC-%Pred-Pre: 103 %
FEV6-%Pred-Pre: 66 %
FEV6-Pre: 1.46 L
FEV6FVC-%Pred-Pre: 105 %
FVC-%Pred-Pre: 63 %
FVC-Pre: 1.47 L
Pre FEV1/FVC ratio: 77 %
Pre FEV6/FVC Ratio: 99 %

## 2022-06-07 LAB — HEPATIC FUNCTION PANEL
ALT: 15 U/L (ref 0–35)
AST: 16 U/L (ref 0–37)
Albumin: 3.9 g/dL (ref 3.5–5.2)
Alkaline Phosphatase: 72 U/L (ref 39–117)
Bilirubin, Direct: 0.1 mg/dL (ref 0.0–0.3)
Total Bilirubin: 0.3 mg/dL (ref 0.2–1.2)
Total Protein: 5.9 g/dL — ABNORMAL LOW (ref 6.0–8.3)

## 2022-06-07 LAB — BASIC METABOLIC PANEL
BUN: 12 mg/dL (ref 6–23)
CO2: 32 mEq/L (ref 19–32)
Calcium: 10.2 mg/dL (ref 8.4–10.5)
Chloride: 99 mEq/L (ref 96–112)
Creatinine, Ser: 0.83 mg/dL (ref 0.40–1.20)
GFR: 67.65 mL/min (ref >60.00)
Glucose, Bld: 109 mg/dL — ABNORMAL HIGH (ref 70–99)
Potassium: 3.9 mEq/L (ref 3.5–5.1)
Sodium: 136 mEq/L (ref 135–145)

## 2022-06-07 LAB — BRAIN NATRIURETIC PEPTIDE: Pro B Natriuretic peptide (BNP): 25 pg/mL (ref 0.0–100.0)

## 2022-06-07 NOTE — Progress Notes (Signed)
PFT done today. 

## 2022-06-07 NOTE — Patient Instructions (Addendum)
ICD-10-CM   1. Chronic respiratory failure with hypoxia (HCC)  J96.11     2. ILD (interstitial lung disease) ([place])  J84.9     3. Pulmonary hypertension (HCC)  I27.20     4. Obstructive sleep apnea  G47.33     5. Therapeutic drug monitoring  Z51.81     6. High risk medication use  Z79.899     7. Research subject  Z00.6     8. Goals of care, counseling/discussion  Z71.89     9. Vaccine counseling  Z71.85        -Progressive interstitial lung disease - clinically slightly progressive since May 2022 - Pulm hypertension since June 2023 and on tyvaso - Now using 7L [place] rest f - Last HRCT Oct 2022 - Significant deterioration in healht status after recent leg ulcers - LFT and creat normal Oct 2023  - Plan - discussed goals of care   - consider no intubation and no CPR ---Continue using oxygen 7 L at rest and  more  with exertion  -Monitor pulse ox with exertion and keep it over 86% -Continue participation in ILD-pro research registry  - meet Schae [facility] 06/07/2022 -Continue pirfenidone  - continue tyvaso - Continue RA Rx with Dr. Gavin Pound: -  , leflunomade, prednisone, flonase, celebrex - restart actemra when medically feasible - ? 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    Plan  - continue trelegy scheduled  - use alb as needed   Followup - 2-3 month  face to face  -30-minute visit [name]

## 2022-06-07 NOTE — Progress Notes (Addendum)
05/21/2018- 76 year old female never smoker followed for allergic rhinitis, rhinosinusitis, asthmatic bronchitis, ILD, OSA,complicated by GERD/Crohn's, Rheumatoid arthritis/prednisone -----4 month follow up for asthma and OSA. Per patient she is still having issues with SOB. She has had 2 foot surgeries since her last visit. States that the [name] is working but [name] wanted to switch her to something else. Unsure of the medication name and I did not see it in his last OV.  Singulair, and Anoro, neb duoneb CPAP auto 5-15/Advanced   Download 100% compliance AHI 1.3/hour. I discussed FENO and she will try Symbicort. Discussed inhaled steroids.  We need to get her a new AeroChamber. Has been on sustained antibiotics for failed appliance in her foot. Notices some runny nose and scant bland mucus.  Wheezing has been well controlled.  Little cough.  She and her husband recognize inability to get any exercise as a substantial reason for dyspnea.  No acute events.  Cardiology continues to follow, including her pulmonary hypertension.  We reviewed her most recent chest CT and discussed implications of progressive interstitial fibrosis.  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  I would like to refer her to Dr. Chase [name] for his opinion and advice. FENO 03/15/18  49 H CT chest Hi Res 02/02/2018- IMPRESSION: 1. Spectrum of findings compatible with fibrotic interstitial lung disease with a slight basilar predominance, with mild progression since 2017. 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 2. Mild patchy air trapping, unchanged, indicative of small airways disease. 3. Left main and two-vessel coronary atherosclerosis. 4. Stable dilated main pulmonary artery, suggesting chronic pulmonary arterial hypertension. Aortic Atherosclerosis (ICD10-I70.0).  06/08/18-  76 year old female never smoker followed for allergic rhinitis, rhinosinusitis, asthmatic bronchitis, ILD, OSA,complicated by GERD/Crohn's, Rheumatoid arthritis/prednisone -----coughing for 2 weeks, yellow, green mucus, SOB  Singulair, Atrovent nasal spray, Symbicort 160, and Anoro, neb albuterol, neb DuoNeb, Complains of increased cough with chest congestion, green/yellow sputum, low-grade fever 99 degrees over the last 2 weeks.  Not on prednisone now.  Last antibiotic in October. Pending ILD consultation with Dr. Chase [name] in December.   07/02/2018  - Visit   76 year old female patient presenting today for acute visit.  Patient was recently treated with a course of Augmentin and reports sputum color is now light green but has decreased in sputum amount since last office visit.  Patient does not feel she is getting better.  Patient remains adherent to Anoro Ellipta.  On arrival to our office today patient was short of breath and oxygen saturations were 81%.  With deep breathing patient was able to increase oxygenation to 85%.  When placed on 2 L O2 patient was able to maintain at 91%.  Patient had previously been on oxygen in 2015 but this was later [facility].  Patient's weight has also increased over the previous office visits, patient remains adherent to diuretic treatment plan as on taking 1 diuretic daily.  Patient reports she is allowed to take an additional diuretic when she feels necessary.  Unfortunately patient has not been weighing herself regularly there is no set plan to when she should take her second diuretic.  Patient is exceptionally sedentary at home and rarely gets out of her bed or lazy boy chair which is about 3 feet from her bed.  Patient's husband takes care of patient completely including getting drinks and food so patient does not have to leave her lazy boy  recliner.  Patient watches many television shows and does not report much activity.   Patient has completed follow-up with  rheumatology last week and no changes to her medications were made.  Patient reports that she was stable at the time.  CPAP compliance report showing 30 out of 30 days used.  All 30 those days greater than 4 hours.  APAP settings 5-15.  Average usage days 7 hours and 56 minutes.  AHI 0.8.    OV 07/31/2018 -referred to interstitial lung disease clinic  Subjective:  [name], female , DOB: 1945-11-17 , age 70 y.o. , MRN: [id number] , ADDRESS: [address] [place] [address]   07/31/2018 -   Chief Complaint  Patient presents with   Follow-up    Pt of [name] that stated to be referred to ILD clinic.  Pt currently has complaints of wheezing which she has had since December 2019, worsening SOB when moving around and also states she has a pain in left side of chest near shoulder. Pt wears between 2-4L O2 but mainly has been wearing 4L at home.      HPI [name] 76 y.o. -accompanied by her husband.  Referred by [name] pulmonologist in our practice for evaluation of interstitial lung disease in the setting of rheumatoid arthritis.  History is gained from her, talking to her husband, review of recent office visits and also the interstitial lung disease questionnaire.  As best as I can gather   [name] ILD Questionnaire  Symptoms: She is known to have ILD on a CT chest in 2017 but she is not aware of it.  She says that she follows normally for sleep apnea and asthma with [name].  She recollects that approximately 11 or 12 years ago she was hospitalized for pneumonia and was on oxygen for short while not otherwise specified.  Then came off oxygen.  After that overall she is been stable and sedentary using her CPAP.  She is mostly sedentary because of obesity, rheumatoid arthritis and also because of the nonhealing ulcer in her feet for which she is been advised sedentary lifestyle to enable healing.  Most recently to  Thanksgiving 2019 she was seen for respiratory exacerbation and given antibiotics and prednisone.  She followed up mid December 2019 and was found to have new onset hypoxemia 85% on room air.  She was then discharged on portable oxygen which she is using 4 L nasal cannula at rest.  She tells me that at home when she comes off of CPAP she is in her 67s on room air at rest.  In fact in the office today she is 83% on room air at rest but 93% on 2 L nasal cannula at rest.  There is concerned that she has worsening ILD particularly summer 2019 on her CT chest had progressive findings compared to 2 years earlier.  The presumption is that this ILD is caused by rheumatoid arthritis.  In the 2019 CT chest CT is reported as probable UIP but in my personal visualization is either indeterminate or alternative diagnosis.  In terms of symptoms: She reports insidious onset of shortness of breath for some years and gradually getting worse.  Level 2 dyspnea at rest.  Level for dyspnea taking a shower.  Level 5 dyspnea walking at her own pace of walking with others of her age and walking up stairs or walking up a hill.  She does have  a cough with yellow and green sputum.  She does cough at night.  There is also wheezing  Past Medical History : She has longstanding history of rheumatoid arthritis since the 1980s.  Used to be followed by Dr. Danie Binder.  As best as I can gather she used to be on methotrexate maybe 10 or 20 years ago.  She took it for a short while and then stopped it because of diarrhea.  She took this for less than 1 year.  She believes that after that she was basically on pain management.  She is only been on prednisone chronic intake for a total of 1 year approximately 4 to 5 years ago.  Around this time Dr. Hoyt Koch retired and she started seeing Dr. Gavin Pound.  Since then she is been on Arava and Plaquenil.  She has never seen any other immunomodulators in the setting of rheumatoid arthritis  according to history.  Overall the rheumatoid arthritis has caused some disability with deformed joints in her hands and also her using cane.    She is not on any anticoagulation is never had any blood clots.  In 2017 she had vascular extremity duplex venous that was negative for DVT.  Most recently she is been dealing with nonhealing ulcer of the left toe/metatarsal .  She had August and September 2018.  And then 1 in the latter part of 2019.  Most recently she says she has had a duplex lower extremity venous.  I do not have access to these results.  She is waiting on this.  Was done in the last few to several days.   She also has a longstanding history of asthma for which she is on Singulair.  Exam nitric oxide was elevated in August 2019 at 49 ppb but 07/31/2018 I snormal in the 20s though she is wheezing   She has nonobstructive coronary artery disease and in March 2018 had a right heart catheterization with elevated pulmonary pressures but also slightly elevated wedge pressure [see below].  She is started on Lasix  She has sleep apnea for which she uses CPAP.  She has obesity  ROS: Positive for arthralgia and arthritis.  Dry eyes and dry mouth but otherwise negative   FAMILY HISTORY of LUNG DISEASE: Denies   EXPOSURE HISTORY: Never smoked any cigarettes, marijuana, vaping, cocaine or intravenous drug use   HOME and HOBBY DETAILS : [place] lived [place] for 8 years.  No mold.  No dampness.  Does use humidifier and does use CPAP but there is no mold in it.  Also uses nebulizer machine but there is no mold.  No pet birds no musical instruments no guarding habits   OCCUPATIONAL HISTORY (122 questions) : Denies   PULMONARY TOXICITY HISTORY (27 items): She has taken nitrofurantoin 9 years ago.  Has not taken methotrexate for a year 5 years ago and is taken sulfasalazine       Results for Hailey, [name] (MRN [id number]) as of 07/31/2018 16:31  Ref. Range  09/03/2015 12:45 02/21/2017  FVC-Pre Latest Units: L 2.18 1.7L  FVC-%Pred-Pre Latest Units: % 84 66%   Results for Hailey, [name] (MRN [id number]) as of 07/31/2018 16:31  Ref. Range 09/03/2015 12:45  DLCO cor Latest Units: ml/min/mmHg 14.06  DLCO cor % pred Latest Units: % 69   HEART CATH MARCH 2018 Right Heart Pressures RA (mean): 13 mmHg RV (S/EDP): 38/12 mmHg PA (S/D, mean): 43/23 (32) mmHg PCWP (mean): 16 mmHg  Conclusions: Mild to moderate, non-obstructive coronary artery disease with 50% mid LAD and 20% proximal RCA stenoses. Mild to moderate pulmonary hypertension with mildly elevated right heart filling pressures. Upper normal left heart filling pressures. Normal Fick cardiac output/index. Equalization of end-diastolic pressures with ventricular concordance. This can be seen in the setting of restrictive process.   Recommendations: Escalate statin therapy to prevent progression of CAD. Increase furosemide to 40 mg BID and KCl to 40 mg BID. Patient to have BMP in 1 week to evaluate renal function and potassium. Continue outpatient pulmonary follow-up. I suspect underlying lung disease is the driving force behind the patient's dyspnea, pulmonary hypertension, and elevated right heart pressures. Outpatient follow-up in cardiology clinic in ~6 weeks.   Nelva Bush, MD   CT chest high res July 2019 - visualized personally - personaly opinion - indeterminate or alt dx for UIP but agree with progression  IMPRESSION: Lungs/Pleura: No pneumothorax. No pleural effusion. No acute consolidative airspace disease or lung masses. Few scattered small solid pulmonary nodules in mid to upper right lung, largest 4 mm in the right upper lobe (series 3/image 46), unchanged since 03/14/2016 CT, considered benign. No new significant pulmonary nodules. Mild patchy air trapping in both lungs on the expiration sequence, unchanged. Patchy peribronchovascular and subpleural reticulation and  ground-glass attenuation throughout both lungs with associated mild traction bronchiectasis and mild architectural distortion. There is a slight basilar gradient to these findings. Tiny focus of honeycombing in anterior right upper lobe (series 8/image 59). These findings have mildly worsened since 03/14/2016 chest CT. 1. Spectrum of findings compatible with fibrotic interstitial lung disease with a slight basilar predominance, with mild progression since 2017. 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 2. Mild patchy air trapping, unchanged, indicative of small airways disease. 3. Left main and two-vessel coronary atherosclerosis. 4. Stable dilated main pulmonary artery, suggesting chronic pulmonary arterial hypertension.   Aortic Atherosclerosis (ICD10-I70.0).     Electronically Signed   By: Ilona Sorrel M.D.   On: 02/02/2018 14:03        OV 03/15/2019  Subjective:  [name], female , DOB: 03/09/1946 , age 9 y.o. , MRN: [id number] , ADDRESS: [address] [place] [address]   03/15/2019 -   Chief Complaint  Patient presents with   IPD (Interstitial pulmonary disease)    Breathing is doing better since being on Esbriet. Has productive cough with green phelgm. Has been on antibiotic for last 6 weeks. Had hip replacement in June.     HPI [name] 76 y.o. 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  Started on Esbriet in March 2020.  Has progressive phenotype.   She presents for routine follow-up with her husband.  She tells me that ever since starting Esbriet in the spring 2020 she has been doing well.  She does not feel that the disease has progressed.  In fact she feels somewhat better.  She continues to use 4 L of oxygen.  She is now taking the Esbriet at 1 pill 3 times daily.  This is the  full dose of 1 pill.  Her last pulmonary function test was in early 2020.  It has been a while since she did her liver function test and she requires a repeat of that today.  She is willing to have her flu shot today.  She is asking for prescription for  emergency electric generator.  She does not know about the [employer] energy assistance program.  She has a handicap sticker.  She is interested in the ILD-PRO research registry study.    She is on Trelegy inhaler and Singulair I am not fully clear why   In June 2020 she had hip replacement for stress fracture and was on 6 weeks of Keflex.  She is slowly improving.  She uses a walker.  OV 05/17/2019  Subjective:  [name], female , DOB: 1946/03/04 , age 66 y.o. , MRN: [id number] , ADDRESS: [address] [place] [address]   05/17/2019 -   Chief Complaint  Patient presents with   Follow-up    Patient reports that she has had a productive cough with yellow sputum and her PCP placed her on an antibiotic.    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  Started on Esbriet in March 2020.  Has progressive phenotype. COVID negative 04/25/2019,. On 02 4L [place] since end 2019  Also hx of asthma  Also hx of chronic sinusitis - 2017 CT maxillary sinusits - DR Wilburn Cornelia  History of sleep apnea on CPAP  HPI [name] 76 y.o. -presents to the ILD clinic for the above issues.  She continues on pirfenidone since March 2020.  She says this is helping her.  No adverse effects.  Last liver function test was August 2020.  She needs another liver function test right now.  No nausea vomiting or diarrhea or weight loss.  She continues on 4 L of oxygen continuously 24/7.  At night she uses CPAP for her sleep apnea.  She and her husband think that she needs 6 L of oxygen at night and they are trying to increase the oxygen.  Although this is not based on data but on subjective reasoning.     New issue associated with sinus issues: Her main issue today is that for the last 3-week she has had worsening of her cough and there is associated yellow sputum.  Her COVID-19 test was -2 days ago.  This been going on for 3 weeks.  She tells me this happens to her every winter.  She is complaining of chronic postnasal drainage.  In 2017 she had a CT scan of the sinus that showed maxillary sinusitis and saw Dr. Wilburn Cornelia but since then has not followed up.  She does not have a history of sinus surgery.  She is frustrated by the cough and the yellow sputum.  Her pulmonary function test itself is stable since she started the [place].  She is interested in the ILD pro registry study.  In terms of her asthma: She is on Trelegy and Singulair.  She does not want to stop this.  There is a clinical history.  She says that if she stops this it gets worse.   In terms of her sleep apnea: She continues on CPAP with oxygen at night.    ROS    IMPRESSION: CT chest Jan  2020 1. Severe tracheobronchomalacia, more pronounced on today's scan. 2. Interval progression of basilar predominant fibrotic interstitial lung disease with particularly increased ground-glass component. No honeycombing. 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 Findings are consistent with UIP per consensus guidelines: Diagnosis of Idiopathic Pulmonary Fibrosis: An Official ATS/ERS/JRS/ALAT Clinical Practice Guideline. [place], Iss 5, [phone], Mar 18 2017. 3.  Stable dilated main pulmonary artery, suggesting chronic pulmonary arterial hypertension. 4. Left main and 3 vessel coronary atherosclerosis.   Aortic Atherosclerosis (ICD10-I70.0).     Electronically Signed   By: Ilona Sorrel M.D.   On: 08/20/2018 08:21   - per HPI  OV 04/02/2020  Subjective:  [name], female , DOB: 1946/03/03 , age 34 y.o. 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  Started on Esbriet in March 2020.  Has progressive phenotype. COVID negative 04/25/2019,. On 02 4L [place] since end 2019  - on ild pro registry  Also hx of asthma -Singulair and inhaler  Also hx of chronic sinusitis - 2017 CT maxillary sinusits - DR Wilburn Cornelia  History of sleep apnea on CPAP  Rheumatoid arthritis seen by Dr. Gavin Pound on leflunomide, Plaquenil and daily prednisone 5 mg/day.  04/02/2020 -   Chief Complaint  Patient presents with   Follow-up    pt is here to go over pft results     HPI [name] 76 y.o. -presents for the above issues of interstitial lung disease in the setting of connective tissue disease.  She is on leflunomide and Plaquenil and prednisone with her rheumatologist.  She is on pirfenidone for her ILD.  I personally last saw her almost a year ago in October 2020.  After that she is seen nurse practitioner.  In the interim she says she is overall stable with the symptom score show a different story she has had significant worsening of her symptoms.  She continues to be morbidly obese.  Walking desaturation test she is desaturating to the point she is requiring 6 L to correct.  In fact early in August 2020 [place] and was extremely short of breath.  Even at rest on 3 L she was 89%.  She continues to use a CPAP.  Her husband who is here with her today would like her to lose weight in order to help her health status.  Pulmonary function test shows also a decline.  Her last CT scan of the chest was in June 2020    [place] 05/13/2020   Subjective:  [name], female , DOB: 1945-09-15, age 47 y.o. years. 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    - Started on Esbriet in March 2020.  Has progressive phenotype. COVID negative 04/25/2019,. On 02 4L [place] since end 2019  - on ild pro registry  Also hx of asthma -Singulair and inhaler  Also hx of chronic sinusitis - 2017 CT maxillary sinusits - DR Wilburn Cornelia  History of sleep apnea on CPAP  Rheumatoid arthritis seen by Dr. Gavin Pound on leflunomide, Plaquenil and daily prednisone 5 mg/day.  Elevated pulmonary artery pressures but PVR less than 3 in 2018: Right Heart Pressures RA (mean): 13 mmHg RV (S/EDP): 38/12 mmHg PA (S/D, mean): 43/23 (32) mmHg PCWP (mean): 16 mmHg  Ao sat: 94% PA sat: 70% RA sat: 75%  Fick CO: 6.4 L/min Fick  CI: 3.7 L/min/m^2  PVR: 2.5 [place] units     Chief Complaint  Patient presents with   Follow-up    more sob walking shorter distances.  coughing up yellow phlegm.       HPI [name] 76 y.o. -last visit September 2021.  At that time concern for progressive ILD.  Therefore we decided to PFTs and also high-resolution CT chest.  The high-resolution CT chest compared to last year shows stability.  The PFTs show stability within this year but progression compared to last year.  She is also lost weight following a low carbohydrate diet.  Nevertheless she feels the weight of her symptoms.  She is also using oxygen with exertion.  She feels this is getting worse.  Although her overall symptom burden seems to be improved with the weight loss.  Early morning she says there are some yellow phlegm which is stable for many many years.  This is mostly sinus.  There is no change in this.  She continues on immunosuppressants and antifibrotic.  There is no  side effects from these.  She is willing to have labs checked.  She has had a Covid vaccine including booster.  She does not know if she has antibody response yet.  She is willing to get this checked.  She is looking at options to get better.  She will participate in the ILD-pro registry visit today.  Review of the records indicate the last right eye catheterization was in 2018.  Pressures were borderline at that time.  She is willing to get this checked.  Her cardiologist is in [place].  She wants to get the procedure done in [place] and is willing to see Dr. Haroldine Laws or Dr. Aundra Dubin who are well versed with right heart catheterization locally.    Wt Readings from Last 3 Encounters:  05/13/20 202 lb 12.8 oz (92 kg)  04/02/20 207 lb 9.6 oz (94.2 kg)  02/05/20 218 lb (98.9 kg)       TECHNIQUE: CT chest sept 2021 Multidetector CT imaging of the chest was performed following the standard protocol without intravenous contrast. High resolution imaging of the lungs, as well as inspiratory and expiratory imaging, was performed.   COMPARISON:  08/16/2018, 02/02/2018, 03/14/2016, 08/27/2015   FINDINGS: Cardiovascular: Aortic atherosclerosis. Normal heart size. Three-vessel coronary artery calcifications. No pericardial effusion.   Mediastinum/Nodes: No enlarged mediastinal, hilar, or axillary lymph nodes. Thyroid gland, trachea, and esophagus demonstrate no significant findings.   Lungs/Pleura: Unchanged moderate pulmonary fibrosis in a pattern with apical to basal gradient featuring irregular peripheral interstitial opacity, septal thickening, ground-glass, and minimal tubular bronchiectasis without evidence of bronchiolectasis or honeycombing. Fibrotic findings are not significantly changed compared to immediate prior examination but significantly worsened over time on examinations dating back to 08/27/2015 occasional stable, definitively benign small pulmonary nodules, for  example a 3 mm nodule of the anterior right upper lobe (series 10, image 77). No significant air trapping on expiratory phase imaging. No pleural effusion or pneumothorax.   Upper Abdomen: No acute abnormality.   Musculoskeletal: No chest wall mass or suspicious bone lesions identified.   IMPRESSION: 1. Unchanged moderate pulmonary fibrosis in a pattern with apical to basal gradient featuring irregular peripheral interstitial opacity, septal thickening, ground-glass, and minimal tubular bronchiectasis without evidence of bronchiolectasis or honeycombing. Fibrotic findings are not significantly changed compared to immediate prior examination but significantly worsened over time on examinations dating back to occasional 08/27/2015. Findings remain consistent with a "probable UIP" pattern by  pulmonary fibrosis criteria and generally in keeping with reported history of rheumatoid arthritis and associated fibrotic interstitial lung disease. 2. Coronary artery disease.  Aortic Atherosclerosis (ICD10-I70.0).     Electronically Signed   By: Eddie Candle M.D.   On: 04/15/2020 15:35     OV 08/19/2020  Subjective:  [name], female , DOB: 1945-07-26 , age 41 y.o. 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    - Started on Esbriet in March 2020.  Has progressive phenotype. COVID negative 04/25/2019,. On 02 4L [place] since end 2019  - on ild pro  registry  Also hx of asthma -Singulair and inhaler  Also hx of chronic sinusitis - 2017 CT maxillary sinusits - DR Wilburn Cornelia  History of sleep apnea on CPAP  Rheumatoid arthritis seen by Dr. Gavin Pound on leflunomide, Plaquenil and daily prednisone 5 mg/day.    HPI [name] 76 y.o. -presents with her husband.  Last seen in October 2021.  After that in January 2022 she was supposed to have a right heart catheterization because of concern of progressive ILD and her having autoimmune phenotype.  However middle of January 2022 she got sick with bronchitis.  She and her husband say that she gets this sick every year.  They do not know if it was Covid because she did not get tested.  She says primary care physician treated her with 10 days of 20 mg/day prednisone [at baseline she is on 5 mg/day of].  And also doxycycline for 10 days.  This is not helped.  Therefore she is now on amoxicillin and a second course of prednisone 20 mg/day for 10 days.  Her baseline dose is 5 mg prednisone.  She is not feeling better.  In fact her symptom scores are much worse.  Her pulse ox on room air at rest was 88%.  Previously room air at rest was fine.  She is frustrated by her symptoms.  She and her husband are frustrated by the symptom burden.  She is requesting Phenergan with codeine for cough control.     OV 09/03/2020  Subjective:  [name], female , DOB: Nov 23, 1945 , age 56 y.o. , MRN: [id number] , ADDRESS: [address] [place] [address] PCP Lorene Dy, MD Patient Care Team: Lorene Dy, MD as PCP - General (Internal Medicine) End, Harrell Gave, MD as PCP - Cardiology (Cardiology) Gavin Pound, MD as Consulting Physician (Rheumatology)  This Provider for this visit: Treatment Team:  Attending Provider: Brand Males, MD  Type of visit: Telephone/Video Circumstance: COVID-19 national emergency Identification of patient [name] with Jan 05, 1946  and MRN [id number] - 2 person identifier Risks: Risks, benefits, limitations of telephone visit explained. Patient understood and verbalized agreement to proceed Anyone else on call: just patient Patient location: [phone] This provider location: [place]   09/03/2020 -  No chief complaint on file.    HPI [name] 76 y.o. -  this telephone visit this telephone visit is to follow-up from recent exacerbation early February 2022.  She is finished her prednisone she is feeling back to baseline.  She says her sinuses are congested but she will follow-up with the ENT.  She is still waiting for a right heart catheterization.  She has follow-up later in March 2022 with me.  Oxygen use is baseline.  She had Covid IgG checked and this was normal.  She continues to use a CPAP.  She is regular with all her medications.  She took consent form for PULSE inhaled nitric oxide study and she is interested.  Recent lab review shows slightly high calcium.  She does not know that she has this problem    PFT   OV 10/15/2020  Subjective:  [name], female , DOB: 01-04-1946 , age 61 y.o. , MRN: [id number] , AD[address]ESS: [address] [place] [address] PCP Lorene Dy, MD Patient Care Team: Lorene Dy, MD as PCP - General (Internal Medicine) End, Harrell Gave, MD as PCP - Cardiology (Cardiology) Gavin Pound, MD as Consulting Physician (Rheumatology)  This Provider for this visit: Treatment Team:  Attending Provider: Brand Males, MD    10/15/2020 -   Chief Complaint  Patient presents with   Follow-up    She felt she was breathing deeper before pft.      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      - Started on Esbriet in March 2020.  Has progressive phenotype. COVID negative 04/25/2019,. On 02 4L [place] since end 2019   - on ild pro registry  - last CT Sept 2021  - started PULSE iNO study march  2022 via study protocol -randomized trial  Also hx of asthma -Singulair and inhaler trelegy  Also hx of chronic sinusitis - 2017 CT maxillary sinusits - DR Wilburn Cornelia  History of sleep apnea on CPAP  Rheumatoid arthritis seen by Dr. Gavin Pound on leflunomide, Plaquenil and daily prednisone 5 mg/day.  Positive Covid IgG - Feb 2022   HPI [name] 76 y.o. -presents for standard of care visit.  She is now on inhaled nitric oxide study.  The inhaled nitric oxide is being bled through her oxygen.  She has to wear 12 hours a day.  She was randomized 2 days ago.  She tells me for the last 4 days she is having sinus complaints of yellow sinus drainage and increased cough.  She feels as acute sinusitis and she will benefit from antibiotics and prednisone.  Specifically she did not mention this at the time of randomization.  She denied this at the time of randomization but now she says it was present for 4 days.  She had pulmonary function test that shows continued decline in ILD.  Her oxygen use remains the same at 4 L and with exertion she uses 5 L.  She is on Trelegy for asthma.  Her CT scan of the chest in September did not show any evidence of emphysema.  She uses CPAP for her sleep apnea.  Labs from research were reviewed by me.  Her most recent hemoglobin is 12.  Creatinine and liver function test was normal.  Calcium is not available.   Of note after starting the inhaled nitric oxide she feels she can breathe more deeper.  However his symptom score shows continued decline.    OV 04/27/2021  Subjective:  [name], female , DOB: [birth year] ,  age 89 y.o. , MRN: [id number] , AD[address]ESS: [address] [place] [address] PCP Lorene Dy, MD Patient Care Team: Lorene Dy, MD as PCP - General (Internal Medicine) End, Harrell Gave, MD as PCP - Cardiology (Cardiology) Gavin Pound, MD as Consulting Physician (Rheumatology)  This Provider for this  visit: Treatment Team:  Attending Provider: Brand Males, MD    04/27/2021 -   Chief Complaint  Patient presents with   Follow-up    Pt states she has been sick for about 6 weeks. Has been on prednisone as well as abx and is still not over it. States she is coughing getting up yellow-green phlegm and also has had some wheezing. Also has had increased SOB.      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    - Started on Esbriet in March 2020.  Has progressive phenotype. COVID negative 04/25/2019,. On 02 4L [place] since end 2019   - on ild pro registry  - started PULSE iNO study march 2022 via study protocol -randomized trial -> randomized to Open Label in sept 2022  - [place] 11/09/20   -   RA = 12 RV = 52/14 PA = 55/25 (38) PCW = 19 Fick cardiac output/index = 5.8/3.1 Thermo CO/CI = 7.4/4.0 PVR = 3.3 WU (Fick) 2.6 WU (thermo) FA sat = 99% PA sat = 71%, 71% SVC sat = 76% Given relatively normal PVR may not benefit much from Tyvas - per cads    Also hx of asthma -Singulair and inhaler trelegy  - 0% eoios last checked 2019  Also hx of chronic sinusitis  - 2017 CT maxillary sinusits - DR Wilburn Cornelia  - CT sinus nov 2020 - celar  History of sleep apnea on CPAP  Rheumatoid arthritis seen by Dr. Gavin Pound on leflunomide, Plaquenil and daily prednisone 5 mg/day.  Positive Covid IgG - Feb 2022   HPI SADY MONACO 76 y.o. -presents for standard of care visit to the ILD center with Dr. Chase [name].  She presents with her husband.  She tells me that since summer 2022 she is having increased sinus drainage.  For the last 6 weeks she is got more cough.  She feels there is a frog in the throat.  She brings yellow drainage from the sinuses.  The drainage increases with Nettie pot saline wash.  Sometimes she is clearing this from the throat.  Her husband says this happens every winter.  2 years ago we did CT scan of the sinus and it was  clear.  She has been on 2 antibiotics.  She felt cephalexin and azithromycin did not help.  She prefers to have doxycycline.  She has been on 1 prednisone 10-day taper.  She feels that she wants another course of doxycycline and prednisone.  She also states that if she takes antibiotics she will want Diflucan.  Her last sinus CT was 2 years ago and her last CT chest was 1 year ago.  She also reports cough and chest tightness.  Worsening shortness of breath.  Despite subjective reporting of worsening shortness of breath subjective objective dyspnea score on the scoring scale looks the same  She continues on antifibrotic pirfenidone.  She is tolerating this well.  She is also on inhaled nitric oxide open label with the oxygen.  Her pulmonary function test shows continued decline.  Because of her symptoms we did a nitric oxide exhaled test and this was high.  Unclear if this because  of inhaled nitric oxide.  Research of the study sponsor on this.    Lab Results  Component Value Date   NITRICOXIDE 139 04/27/2021   Xxxxxxxxxxxxxxxxxxxxxx  05/24/2021 -  Followup Test results - video visit   HPI [name] 76 y.o. - standard of care visit to discuss CT  and lab results  Per research team:  She rated her shortness of breath today at a 5 out of 5 today and has been since July 18th, before it was 4 out of 5. Her Borg Dyspnea score was 3 (moderate) at rest the last two visits and before that only rated it 1 (slight) back in June. On 6wmd: he walked 153 meters today compared to 120 meters on 03/29/2021. SHe is currently on Open Labvel extensio of the inhaled NO Study-she was able to walk just since 6 L.  She is now on scheduled to see research team every 4 months.   34md  - 14m start of study - 167 m I June 2022 - 120 m sept 2022 -> open labe inhaled NO rolled over July 2022 - 1585m1/7/22 - needed 6L . -> improved  Labs  - ANCA - normal - IgE and RAST allergy panel negative - PFT shows  continued decline    Overall it appears based on pulmonary function test and CT scan of the chest she is declined significantly.  She also has severe tracheobronchomalacia which has not been documented in the prior CT scan.  I did discuss the progression with her.  She indicated to me that she was worried about her progression.  She appears to be on optimal antifibrotic therapy with pirfenidone.  We reviewed her rheumatoid arthritis medication she no longer on Plaquenil.  Recent documented above.  Subsequently did discuss with Dr. AnGavin Pound We considered possibility of using Actemra which in scleroderma ILD is shown lung stabilization/modulation effect.  She will try to make a switch for this if insurance would approve.  Did indicate to patient that if she continued to decline then we would involve palliative care at home.  At this point in time we resolved that she will continue with the research protocol. The tracheobronchomalacia is the likely reason for exertional wheezing.      OV 09/16/2021  Subjective:  Patient ID: DoBarrie Dunkerfemale , DOB: 9/09-Apr-1946 age 76.o. , MRN: [id number] [address]: [address] [id number] RoLorene DyMD Patient Care Team: RoLorene DyMD as PCP - General (Internal Medicine) End, ChHarrell GaveMD as PCP - Cardiology (Cardiology) HaGavin PoundMD as Consulting Physician (Rheumatology)  This Provider for this visit: Treatment Team:  Attending Provider: RaBrand MalesMD    09/16/2021 -   Chief Complaint  Patient presents with   Follow-up    Pt states her breathing has become better since stopping the nitric oxide that she was doing with research. States she does have an occasional cough.     HPI DoKIMERLY ROWAND89.o. -presents with her husband.  On 08/24/2021 she completely withdrew from the inhaled nitric oxide open label extension part of the study.  The coordinator did mention that to me back then.  She  is simply not interested in the study.  In fact she refused even participate in the follow-up visits without the investigational medical product.  She just wants to be done with it.  She was frustrated with the device in the machine and the noise.  In addition she felt that the inhaled nitric oxide was making the sinus situation worse.  She believes that after going to open label extension she got worse.  Since stopping the inhaler anticoagulate her sinus complaints are better.  She feels also is that she is less short of breath.  Of note she has now had 3 doses once monthly of Actemra for rheumatoid arthritis.  Correlating with this the husband notices that she is using her cane less and she is in less pain and she is moving better. For chronic respiratory failure she uses 6 L nasal cannula continuous at rest and with exertion.  Overall she feels better.  Symptom score is below.  She still continues to participate in the ILD-Pro registry and today she has a visit for that.        OV 12/08/2021  Subjective:  [name], female , DOB: 10-Aug-1945 , age 12 y.o. , MRN: [id number] , AD[address]ESS: [address] [place] [id number] PCP Lorene Dy, MD Patient Care Team: Lorene Dy, MD as PCP - General (Internal Medicine) End, Harrell Gave, MD as PCP - Cardiology (Cardiology) Gavin Pound, MD as Consulting Physician (Rheumatology)  This Provider for this visit: Treatment Team:  Attending Provider: Brand Males, MD    12/08/2021 -   Chief Complaint  Patient presents with   Follow-up    PFT performed today.  Pt states she feels like her breathing has gotten better since last visit.    [name] 76 y.o. - presemnt wth husband. Reports now needing 7L based on subjective needs. Taking mucinex. Cough some more productive than useal but main thing is dyspnea  scor worse. Last RHC over a year ago.  Taking esbriet, actemra, prednisone and arava. Uses  walker. HAs Left hip pain and also left shoulder pain. Not a surgical candidate. PFT today shows some decline compared to a year ago. Explained to her that while on iNO study her PFT actually improved in the short run. She finds that difficult to fathom because she did not tolerate the OLE   12/24/2021: Virtual visit with Volanda Napoleon NP post [place] for [facility]. She had mild to moderate PAH with elevated PVR. Started on Tyvaso DPI. Continued on Esbriet 801 mg tid with plans to repeat HRCT in October 2023. Continued on Trelegy and PRN nebs/albuterol for COPD. Excellent compliance with CPAP use. O2 stable on 7 lpm supplemental O2. Follow up in one month and then in August with Dr. Chase [name].   03/17/2022: Today - follow up Patient presents today with husband for follow up after repeat pulmonary function testing, which were overall stable. She started on Tyvaso about 2 weeks ago. She does feel like it has helped her breathing and like she doesn't have as much chest congestion/tightness. She still has very high symptom burden with limited activity tolerance. She's unable to do much around the house besides basic ADLs. Her husband is very supportive. They do go out to eat for lunch often and she gets winded walking from the parking lot to the oxygen. Cough is minimal. No wheezing, hemoptysis, fevers, increased leg swelling, orthopnea (on CPAP), PND. She continues on supplemental oxygen 7 lpm and maintains oxygen levels in the 90's. She wears CPAP nightly. Denies morning headaches or drowsy driving.     OV 43/15/4008  Subjective:  [name], female , DOB: 1945/12/09 , age 60 y.o. , MRN: [id number] , ADDRESS: [address] [place] [address] PCP  Lorene Dy, MD Patient Care Team: Lorene Dy, MD as PCP - General (Internal Medicine) End, Harrell Gave, MD as PCP - Cardiology (Cardiology) Gavin Pound, MD as Consulting Physician (Rheumatology)  This Provider for this visit: Treatment  Team:  Attending Provider: Brand Males, MD    06/07/2022 -   Chief Complaint  Patient presents with   Follow-up    PFT performed today.  Pt states she has been worse since last visit. Was recently in the hospital due to cellulitis of left leg.    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    - [facility] HRCT Oct 2022  - Started on Esbriet in March 2020.  Has progressive phenotype. COVID negative 04/25/2019,.   - on ild pro registry  - started PULSE iNO study march 2022 via study protocol -randomized trial -> randomized to Open Label in sept 2022 -> stopped 08/24/21 due to sinus congestion and inconvieniece     -      Also hx of chronic sinusitis  - 2017 CT maxillary sinusits - DR SWilburn Cornelia - CT sinus nov 2020 and October 2022- celar  - normal ANCA panel Oct 2022  History of sleep apnea on CPAP  Rheumatoid arthritis seen by Dr. AGavin Poundon -  leflunomide,  - daily prednisone 5 mg/day.  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 -returns for follow-up.  Have not seen her  since the spring 2023.  Since then she continues to be on pirfenidone.  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  She is on the full dose but she forgets to take it half the time so she takes DPI 2 times daily.  In October 2023 she got hospitalized for nonhealing ulcers and bad cellulitis of bilateral shin.  Notes reviewed.  Husband showed pictures.  They are slowly improving although the left lower extremity shin is still nonhealing.  Her oxygen use continues to be at 7 L/min.  Her pulmonary function test shows continued decline.  She continues to be on Symbicort and other medications.  In terms of her rheumatoid arthritis Actemra is on hold but she is on leflunomide prednisone    SYMPTOM SCALE - ILD 03/15/2019   05/17/2019   04/02/2020   05/13/2020     08/19/2020 covid 10/15/2020   04/27/2021 210# 09/16/2021   12/08/2021 204# 03/17/2022 06/07/2022   O2 use x  5L [place] pule - ino study 6L [place] . Off iNO study 7L [place] wth home concerntator 7 L [place] 7L [place]  Shortness of Breath 0 -> 5 scale with 5 being worst (score 6 If unable to do)                    At rest _0 4  Simple tasks - showers, clothes change, eating, shaving _1 3  Household (dishes, doing bed, laundry) _2 Shopping _3 Walking level at own pace _4 Walking up Stairs _5 Total (40 - 48) Dyspnea Score _6 How bad is your cough? _7 0 _8 How bad is your fatigue _9 nauea     0 0 0 0 0 0 0 0 3  vomit     0 0 0 0 0 0 0 0 0  diarrhea     _10 anxiety     _11 depression     _12 PFT     Latest Ref Rng & Units 06/07/2022    9:49 AM 03/17/2022   11:22 AM 12/08/2021    3:01 PM 05/24/2021   12:43 PM 03/29/2021    2:07 PM 02/02/2021    8:12 AM 12/08/2020    9:03 AM   PFT Results  FVC-Pre L 1.47  P 1.53  P 1.38  1.44  1.54  2.00  1.45   FVC-Predicted Pre % 63  P 64  P 58  60  63  82  63   Pre FEV1/FVC % % 77  P 76  P 80  74  79  63  78   FEV1-Pre L 1.13  P 1.16  P 1.10  1.06  1.21  1.26  1.14   FEV1-Predicted Pre % 65  P 65  P 62  60  67  69  66   DLCO uncorrected ml/min/mmHg 6.18  P 6.69  P 7.13  8.17  6.95  9.22  9.05   DLCO UNC% % 36  P 39  P 41  47  40  53  54   DLCO corrected ml/min/mmHg 6.28  P 6.69  P 7.13       DLCO COR %Predicted % 36  P 39  P 41       DLVA Predicted % 67  P 71  P 74  76  72  85  96     P Preliminary result       has a past medical history of Acute asthmatic bronchitis, Allergic rhinitis, Anxiety, Arthritis, Esophageal reflux, Hypertension, Interstitial lung disease ([place]) (dx jan 2020), Irritable bowel syndrome, Oxygen dependent, PONV (postoperative nausea and vomiting), Pulmonary hypertension ([place]), Rheumatoid arthritis(714.0), and Sleep apnea.   reports that she has never smoked. She has never used smokeless tobacco.  Past  Surgical History:  Procedure Laterality Date    c secttion  1977   2 foot fusions Left    total of 6 left foot sx   ABDOMINAL HYSTERECTOMY     complete    ANKLE FUSION  2009   left   BACK SURGERY     lower l to l 5 fused   CHOLECYSTECTOMY     RIGHT HEART CATH N/A 11/09/2020   Procedure: RIGHT HEART CATH;  Surgeon: Jolaine Artist, MD;  Location: [facility];  Service: Cardiovascular;  Laterality: N/A;   RIGHT/LEFT HEART CATH AND CORONARY ANGIOGRAPHY N/A 09/16/2016   Procedure: Right/Left Heart Cath and Coronary Angiography;  Surgeon: Nelva Bush, MD;  Location: [facility];  Service: Cardiovascular;  Laterality: N/A;   RIGHT/LEFT HEART CATH AND CORONARY ANGIOGRAPHY N/A 12/17/2021   Procedure: RIGHT/LEFT HEART CATH AND CORONARY ANGIOGRAPHY;  Surgeon: Jolaine Artist, MD;  Location: [facility];  Service: Cardiovascular;  Laterality: N/A;   TONSILLECTOMY     TOTAL  HIP ARTHROPLASTY Right 12/20/2018   Procedure: TOTAL HIP ARTHROPLASTY ANTERIOR APPROACH;  Surgeon: Paralee Cancel, MD;  Location: [facility];  Service: Orthopedics;  Laterality: Right;  70 mins   TOTAL KNEE ARTHROPLASTY Bilateral     Allergies  Allergen Reactions   Cefdinir Diarrhea   Erythromycin Base Diarrhea   Lactose Dermatitis   Lactulose Diarrhea   Mesalamine Nausea Only   Methotrexate Other (See Comments)    Felt sick   Nitrofuran Derivatives     shakiness   Other Diarrhea and Other (See Comments)    Shaking uncontrollably "lettuce only" "lettuce only"   Sulfa Antibiotics Other (See Comments)    Patient can't recall reaction    Tdap [Tetanus-Diphth-Acell Pertussis]     Shaking uncontrollably   Tetanus Toxoid, Adsorbed Other (See Comments)    Shaking uncontrollable     Immunization History  Administered Date(s) Administered   Fluad Quad(high Dose 65+) 03/15/2019   Influenza Split 03/18/2012, 03/18/2013, 04/14/2014, 04/17/2017   Influenza Whole 04/30/2008, 04/20/2009, 04/18/2011   Influenza, High Dose Seasonal PF 04/23/2015, 03/24/2016, 04/06/2018, 04/02/2020, 03/24/2021, 04/13/2022   Influenza,inj,quad, With Preservative 04/06/2018   PFIZER(Purple Top)SARS-COV-2 Vaccination 08/24/2019, 09/18/2019, 04/13/2020   Pneumococcal Conjugate-13 11/11/2013   Pneumococcal Polysaccharide-23 05/21/2018   Zoster Recombinat (Shingrix) 05/05/2017    Family History  Problem Relation Age of Onset   Heart disease Mother    Arthritis Mother    Heart attack Father    Diabetes Other        sibling   Heart attack Other        sibling     Current Outpatient Medications:    albuterol (VENTOLIN HFA) 108 (90 Base) MCG/ACT inhaler, USE 2 INHALATIONS BY MOUTH  EVERY 6 HOURS AS NEEDED (Patient taking differently: Inhale 2 puffs into the lungs every 6 (six) hours as needed for wheezing or shortness of breath.), Disp: 34 g, Rfl: 3   aspirin EC 81 MG tablet, Take 81 mg by mouth daily., Disp: ,  Rfl:    benzonatate (TESSALON) 200 MG capsule, TAKE 1 CAPSULE (200 MG TOTAL) BY MOUTH 3 (THREE) TIMES DAILY AS NEEDED FOR COUGH., Disp: 90 capsule, Rfl: 2   cetirizine (ZYRTEC) 10 MG tablet, Take 20 mg by mouth daily., Disp: , Rfl:    Cholecalciferol (VITAMIN D3) 250 MCG (10000 UT) capsule, Take 10,000 Units by mouth daily., Disp: , Rfl:    clidinium-chlordiazePOXIDE (LIBRAX) 5-2.5 MG capsule, Take 1 capsule by mouth 3 (  three) times daily as needed (for IBS)., Disp: , Rfl:    colestipol (COLESTID) 1 g tablet, Take 1 g by mouth daily., Disp: , Rfl:    dicyclomine (BENTYL) 20 MG tablet, Take 20 mg by mouth 4 (four) times daily as needed (IBS)., Disp: , Rfl:    FLUoxetine (PROZAC) 20 MG capsule, Take 20 mg by mouth daily., Disp: , Rfl:    fluticasone (FLONASE) 50 MCG/ACT nasal spray, SPRAY 2 SPRAYS INTO EACH NOSTRIL EVERY DAY (Patient taking differently: Place 2 sprays into both nostrils daily.), Disp: 48 mL, Rfl: 3   furosemide (LASIX) 40 MG tablet, TAKE 1 TABLET BY MOUTH DAILY, Disp: 90 tablet, Rfl: 1   ipratropium (ATROVENT) 0.03 % nasal spray, USE 1 TO 2 SPRAYS IN BOTH  NOSTRILS TWICE DAILY, Disp: 90 mL, Rfl: 3   ipratropium-albuterol (DUONEB) 0.5-2.5 (3) MG/3ML SOLN, USE 1 VIAL IN NEBULIZER EVERY 6 HOURS AS NEEDED J45.901 (Patient taking differently: Take 3 mLs by nebulization every 6 (six) hours as needed (wheezing, shortness of breath).), Disp: 270 mL, Rfl: 11   KLOR-CON M20 20 MEQ tablet, TAKE 1 TABLET BY MOUTH TWICE A DAY, Disp: 180 tablet, Rfl: 0   leflunomide (ARAVA) 20 MG tablet, Take 1 tablet (20 mg total) by mouth daily., Disp: , Rfl:    loperamide (IMODIUM) 2 MG capsule, Take 1 capsule (2 mg total) by mouth 2 (two) times daily as needed for diarrhea or loose stools., Disp: 30 capsule, Rfl: 0   Menthol, Topical Analgesic, (BIOFREEZE COOL THE PAIN) 4 % GEL, Apply 1 Application topically 2 (two) times daily as needed (pain)., Disp: , Rfl:    methocarbamol (ROBAXIN) 750 MG tablet, Take 750 mg  by mouth 2 (two) times daily as needed for muscle spasms., Disp: , Rfl:    montelukast (SINGULAIR) 10 MG tablet, TAKE 1 TABLET BY MOUTH AT  BEDTIME, Disp: 90 tablet, Rfl: 3   mupirocin ointment (BACTROBAN) 2 %, Apply topically daily., Disp: 22 g, Rfl: 0   omeprazole (PRILOSEC) 40 MG capsule, Take 40 mg by mouth at bedtime. , Disp: , Rfl:    Oxycodone HCl 10 MG TABS, Take 10 mg by mouth 3 (three) times daily., Disp: , Rfl:    OXYGEN, Inhale 7 L into the lungs continuous., Disp: , Rfl:    Pirfenidone (ESBRIET) 801 MG TABS, Take 801 mg by mouth 3 (three) times daily., Disp: 270 tablet, Rfl: 3   Polyethyl Glycol-Propyl Glycol (SYSTANE) 0.4-0.3 % GEL ophthalmic gel, Place 2 application  into both eyes 2 (two) times daily as needed (dry eyes)., Disp: , Rfl:    predniSONE (DELTASONE) 5 MG tablet, Take 5 mg by mouth daily with breakfast., Disp: , Rfl:    rosuvastatin (CRESTOR) 20 MG tablet, TAKE 1 TABLET BY MOUTH DAILY, Disp: 90 tablet, Rfl: 0   spironolactone (ALDACTONE) 25 MG tablet, Take 0.5 tablets (12.5 mg total) by mouth daily., Disp: 30 tablet, Rfl: 0   Tocilizumab (ACTEMRA IV), Inject into the vein every 30 (thirty) days. infusion, Disp: , Rfl:    traMADol (ULTRAM) 50 MG tablet, Take 50 mg by mouth 3 (three) times daily as needed for moderate pain., Disp: , Rfl:    traZODone (DESYREL) 100 MG tablet, Take 100 mg by mouth at bedtime., Disp: , Rfl:    TRELEGY ELLIPTA 100-62.5-25 MCG/ACT AEPB, USE 1 INHALATION BY MOUTH DAILY (Patient taking differently: Inhale 1 puff into the lungs daily.), Disp: 180 each, Rfl: 3   TYVASO DPI MAINTENANCE KIT 64  MCG POWD, Inhale 1 puff into the lungs in the morning, at noon, in the evening, and at bedtime., Disp: , Rfl:    Verapamil HCl CR 300 MG CP24, Take 1 capsule (300 mg total) by mouth daily., Disp: 90 capsule, Rfl: 1      Objective:   Vitals:   06/07/22 1059  BP: 118/68  Pulse: (!) 101  SpO2: 91%  Weight: 187 lb 3.2 oz (84.9 kg)  Height: 5' 0.5" (1.537  m)    Estimated body mass index is 35.96 kg/m as calculated from the following:   Height as of this encounter: 5' 0.5" (1.537 m).   Weight as of this encounter: 187 lb 3.2 oz (84.9 kg).  _0 @  Filed Weights   06/07/22 1059  Weight: 187 lb 3.2 oz (84.9 kg)     Physical Exam General: No distress. Obese, sitting in wheel chair Neuro: Alert and Oriented x 3. GCS 15. Speech normal Psych: Pleasant Resp:  Barrel Chest - no.  Wheeze - no, Crackles - yes, No overt respiratory distress CVS: Normal heart sounds. 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 EDEMA +. LLE in bandage + HEENT: Normal upper airway. PEERL +. No post nasal drip        Assessment:       ICD-10-CM   1. Chronic respiratory failure with hypoxia (HCC)  J96.11     2. ILD (interstitial lung disease) ([place])  J84.9     3. Pulmonary hypertension (HCC)  I27.20     4. Obstructive sleep apnea  G47.33     5. Therapeutic drug monitoring  Z51.81     6. High risk medication use  Z79.899     7. Research subject  Z00.6     8. Goals of care, counseling/discussion  Z71.89     9. Vaccine counseling  Z71.85          Plan:     Patient Instructions     ICD-10-CM   1. Chronic respiratory failure with hypoxia (HCC)  J96.11     2. ILD (interstitial lung disease) ([place])  J84.9     3. Pulmonary hypertension (HCC)  I27.20     4. Obstructive sleep apnea  G47.33     5. Therapeutic drug monitoring  Z51.81     6. High risk medication use  Z79.899     7. Research subject  Z00.6     8. Goals of care, counseling/discussion  Z71.89     9. Vaccine counseling  Z71.85        -Progressive interstitial lung disease - clinically slightly progressive since May 2022 - Pulm hypertension since June 2023 and on tyvaso - Now using 7L [place] rest f - Last HRCT Oct 2022 - Significant deterioration in healht status after recent leg ulcers - LFT and creat normal Oct 2023  - Plan - discussed goals  of care   - consider no intubation and no CPR ---Continue using oxygen 7 L at rest and  more  with exertion  -Monitor pulse ox with exertion and keep it over 86% -Continue participation in ILD-pro research registry  - meet [facility] 06/07/2022 -Continue pirfenidone  - continue tyvaso - Continue RA Rx with Dr. Gavin Pound: -  , leflunomade, prednisone, flonase, celebrex - restart actemra when medically feasible - ? 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    Plan  - continue trelegy scheduled  - use alb as needed   Followup - 2-3 month  face to face  -30-minute visit Dr. Chase [name]    High complex medical condition requiring high risk prescription with intensive therapeutic monitoring requirement   SIGNATURE    Dr. Brand Males, M.D., F.C.C.P,  Pulmonary and Critical Care Medicine Staff Physician, [place] Director - Interstitial Lung Disease  Program  [facility] [place] [facility] [place], [place], [address]  Pager: [phone], If no answer or between  15:00h - 7:00h: call [phone] Telephone: [phone]  11:38 AM 06/07/2022

## 2022-06-07 NOTE — Research (Signed)
Title: Chronic Fibrosing Interstitial Lung Disease with Progressive Phenotype Prospective Outcomes (ILD-PRO) Registry    Protocol #: IPF-PRO-SUB, Clinical Trials # [id number], Sponsor: [employer]   Protocol Version Amendment 4 dated 12Sep2019  and confirmed current on  Consent Version for today's visit date of  Is [place] IRB Approved Version 21 Jun 2018 Revised 21 Jun 2018   Objectives:  Describe current approaches to diagnosis and treatment of chronic fibrosing ILDs with progressive phenotype  Describe the natural history of chronic fibrosing ILDs with progressive phenotype  Assess quality of life from self-administered participant reported questionnaires for each disease group  Describe participant interactions with the healthcare system, describe treatment practices across multiple institutions for each disease group  Collect biological samples linked to well characterized chronic fibrosing ILDs with progressive phenotype to identify disease biomarkers  Collect data and biological samples that will support future research studies.                                            Key Inclusion Criteria: Willing and able to provide informed consent  Age ? 30 years  Diagnosis of a non-IPF ILD of any duration, including, but not limited to Idiopathic Non-Specific Interstitial Pneumonia (INSIP), Unclassifiable Idiopathic Interstitial Pneumonias (IIPs), Interstitial Pneumonia with Autoimmune Features (IPAF), Autoimmune ILDs such as Rheumatoid Arthritis (RA-ILD) and Systemic Sclerosis (SSC-ILD), Chronic Hypersensitivity Pneumonitis (HP), Sarcoidosis or Exposure-related ILDs such as asbestosis.  Chronic fibrosing ILD defined by reticular abnormality with traction bronchiectasis with or without honeycombing confirmed by chest HRCT scan and/or lung biopsy.  Progressive phenotype as defined by fulfilling at least one of the criteria below of fibrotic changes (progression set point)  within the last 24 months regardless of treatment considered appropriate in individual ILDs:  decline in FVC % predicted (% pred) based on >10% relative decline  decline in FVC % pred based on ? 5 - <10% relative decline in FVC combined with worsening of respiratory symptoms as assessed by the site investigator  decline in FVC % pred based on ? 5 - <10% relative decline in FVC combined with increasing extent of fibrotic changes on chest imaging (HRCT scan) as assessed by the site investigator  decline in DLCO % pred based on ? 10% relative decline  worsening of respiratory symptoms as well as increasing extent of fibrotic changes on chest imaging (HRCT scan) as assessed by the site investigator independent of FVC change.     Key Exclusion Criteria: Malignancy, treated or untreated, other than skin or early stage prostate cancer, within the past 5 years  Currently listed for lung transplantation at the time of enrollment  Currently enrolled in a clinical trial at the time of enrollment in this registry       Clinical Research Coordinator / Research RN note : This visit for [place] [phone] with DOB: [phone] on 21Nov2023 for the above protocol is Visit/Encounter 6 and is for purpose of research.    Subject expressed continued interest and consent in continuing as a study subject. Subject confirmed that there was no change in contact information (e.g. address, telephone, email). Subject thanked for participation in research and contribution to science.     During this visit on 21Nov2023 , the subject completed the blood work and questionnaires per the above referenced protocol. Please refer to the subject's paper source binder for further details.  Signed by [place] [facility]  [employer], [place] 21Nov2023 10:43am

## 2022-06-13 ENCOUNTER — Encounter (HOSPITAL_BASED_OUTPATIENT_CLINIC_OR_DEPARTMENT_OTHER): Payer: Medicare Other | Admitting: Internal Medicine

## 2022-06-13 DIAGNOSIS — J849 Interstitial pulmonary disease, unspecified: Secondary | ICD-10-CM

## 2022-06-13 DIAGNOSIS — M069 Rheumatoid arthritis, unspecified: Secondary | ICD-10-CM | POA: Diagnosis not present

## 2022-06-13 DIAGNOSIS — I87312 Chronic venous hypertension (idiopathic) with ulcer of left lower extremity: Secondary | ICD-10-CM

## 2022-06-13 DIAGNOSIS — L97828 Non-pressure chronic ulcer of other part of left lower leg with other specified severity: Secondary | ICD-10-CM

## 2022-06-13 NOTE — Progress Notes (Signed)
Hailey, [name] ([id number]) [id number] Page 1 of 9 Visit Report for 06/13/2022 Chief Complaint Document Details Patient Name: Date of Service: Hailey Courser, DO [name] 06/13/2022 1:15 PM Medical Record Number: [id number] Patient Account Number: [id number] Date of Birth/Sex: Treating RN: 1946/05/17 (76 y.o. F) Primary Care Provider: Lorene Dy Other Clinician: Referring Provider: Treating Provider/Extender: [name] in Treatment: 1 Information Obtained from: Patient Chief Complaint 06/03/2022; Left lower extremity wounds Electronic Signature(s) Signed: 06/13/2022 2:58:01 PM By: Kalman Shan DO Entered By: Kalman Shan on 06/13/2022 14:01:56 -------------------------------------------------------------------------------- HPI Details Patient Name: Date of Service: Hailey Courser, DO [name] 06/13/2022 1:15 PM Medical Record Number: [id number] Patient Account Number: [id number] Date of Birth/Sex: Treating RN: [birth year] (76 y.o. F) Primary Care Provider: Lorene Dy Other Clinician: Referring Provider: Treating Provider/Extender: [name] in Treatment: 1 History of Present Illness HPI Description: Patient presents today for initial evaluation and our clinic as a referral from the [name] Department of orthopedics for evaluation and treatment of the wound to his his of the left foot at the base of the great toe. Currently the good news is the patient really does not have any significant pain which is excellent. She has been seen by Dr. Linus Salmons at [facility]: infectious disease clinic that is the regional Center for infectious disease. Subsequently the patient was cultured and positive for methicillin sensitive staph aureus. She has been on antibiotics including Cipro prior to surgery as will several days following surgery. She then had clindamycin which was changed to doxycycline.  She took this for 10 days. Subsequently the pain had improved and there was no longer any possible draining from the wound according to the notes. The patient sedentary rate as well as C-reactive protein have returned to normal ranges. Currently per Dr. Henreitta Leber assessment there was one dehiscence with no sign of osteomyelitis. He therefore discontinue the antibiotics with the completion of the last three days Of doxycycline. Other than that he just set her for a follow-up as needed. The patient does not have diabetes and is not a current smoker. At this time her treatment that was recommended by the surgeon was applying a Hydrocolloid dressing. Based on what I'm seeing at this point the patient actually has some Slough noted over the surface of the wound there really does not appear to be evidence of infection though I do think she does require some sharp debridement to clear away the slough and help with appropriate wound healing. She does have areas of granulation buds noted. She may be a candidate for a skin substitute as well. We will see how things do over the next period of time until follow-up. No fevers, chills, nausea, or vomiting noted at this time. 06/13/18 on evaluation today patient actually appears to be doing better in regard to her toe ulcer. She's been using the Prisma on this region and that seems to have done extremely well for her. Fortunately there does not appear to be evidence of infection at this time which is good news. She did see her surgeon they were extremely pleased with the overall appearance of her wound. 06/27/18 on evaluation today patient actually appears to be doing very well in regard to her foot ulcer. She has been tolerating the dressing changes without complication which is great news. She did see her podiatrist they felt like everything is looking very nice as well and will please. 07/05/2018 surgical wound on the left  foot. She appears to be doing well. Still  surface debrided to remove however in general post debridement the wound bed looks quite healthy it is come down significantly in terms of dimensions using silver collagen. The patient describes pain when her foot is elevated either in bed at night [sometimes keeps her awake] or when is propped up on a foot rest. She basically takes analgesics for this. She does not really describe claudication with activity however her activity is very limited by interstitial lung disease. Her ABIs initially in this clinic were noncompressible. She is not a diabetic 07/20/2018; surgical wound on the left foot. Wound actually looks quite a bit better than I remember seeing this. She is still describing pain with her leg elevated at night that she does not get when the wound is supine. She does not really describe claudication but she is very limited by her pulmonary status. We have been using silver collagen. 1/17; the patient has been followed up by orthopedics and discharge. Her arterial studies were actually quite good and should not be contributing to any pain. Slight reduction in ABIs but otherwise normal. Her wound is closed. She saw Dr. Linus Salmons of infectious disease surrounding the surgery. By review of our notes Hailey, [name] ([id number]) [id number] Page 2 of 9 she was not felt to have osteomyelitis. 06/03/2022 Ms. [name] is a 77 year old female with a past medical history of interstitial lung disease on chronic oxygen via nasal cannula, rheumatoid arthritis, chronic diastolic heart failure and OSA that presents to the clinic for a 1 to 20-monthhistory of nonhealing ulcer to the left lower extremity. On 04/20/2022 she was admitted to the hospital for left lower extremity cellulitis. She required 8 days of IV vancomycin and Unasyn followed by a 14-day course of Augmentin and doxycycline. She has been using Silvadene cream to the area. She does not use compression therapy but does  own compression stockings. She currently denies signs of infection. 11/27; patient presents for follow-up she is using Medihoney and Hydrofera Blue to the wound beds. She has been using her Tubigrip. She has no issues or complaints today. Electronic Signature(s) Signed: 06/13/2022 2:58:01 PM By: HKalman ShanDO Entered By: HKalman Shanon 06/13/2022 14:02:36 -------------------------------------------------------------------------------- Physical Exam Details Patient Name: Date of Service: CArnetha Courser DO [name] 06/13/2022 1:15 PM Medical Record Number: [id number] Account Number: [id number] of Birth/Sex: Treating RN: [birth year](76y.o. F) Primary Care Provider: RLorene DyOther Clinician: Referring Provider: Treating Provider/Extender: HKrista Bluein Treatment: 1 Constitutional respirations regular, non-labored and within target range for patient.. Cardiovascular 2+ dorsalis pedis/posterior tibialis pulses. Psychiatric pleasant and cooperative. Notes T the lateral aspect of the left lower extremity there are 2 open wounds with nonviable tissue and granulation tissue. 2+ pitting edema to the knee. Obvious o deformity to the foot from previous surgeries. No signs of surrounding soft tissue infection. Electronic Signature(s) Signed: 06/13/2022 2:58:01 PM By: HKalman ShanDO Entered By: HKalman Shanon 06/13/2022 14:03:35 -------------------------------------------------------------------------------- Physician Orders Details Patient Name: Date of Service: CArnetha Courser DO [name] 06/13/2022 1:15 PM Medical Record Number: [id number] Account Number: [id number] of Birth/Sex: Treating RN: [birth year](76y.o. FDebby BudPrimary Care Provider: RLorene DyOther Clinician: Referring Provider: Treating Provider/Extender: HKrista Bluein Treatment: 1 Verbal / Phone Orders:  No Diagnosis Coding Follow-up Appointments ppointment in 1 week. - Dr. HHeber CarolinaReturn A ppointment in 2 weeks. - Dr.  Daun Rens Return A Other: - Dr. Gwenlyn Found office will call you to schedule an appt. Anesthetic TAYLORMARIE, REGISTER ([id number]) [id number] Page 3 of 9 (In clinic) Topical Lidocaine 5% applied to wound bed Bathing/ Shower/ Hygiene May shower with protection but do not get wound dressing(s) wet. Edema Control - Lymphedema / SCD / Other Elevate legs to the level of the heart or above for 30 minutes daily and/or when sitting, a frequency of: - 3-4 times a day throughout the day. Avoid standing for long periods of time. Wound Treatment Wound #4 - Lower Leg Wound Laterality: Left, Anterior, Proximal Cleanser: Wound Cleanser (Generic) 1 x Per Day/30 Days Discharge Instructions: Cleanse the wound with wound cleanser prior to applying a clean dressing using gauze sponges, not tissue or cotton balls. Prim Dressing: Hydrofera Blue Ready Foam, 4x5 in (Generic) 1 x Per Day/30 Days ary Discharge Instructions: Apply to wound bed as instructed Prim Dressing: MediHoney Gel, tube 1.5 (oz) 1 x Per Day/30 Days ary Discharge Instructions: Apply to wound bed as instructed Secondary Dressing: ABD Pad, 8x10 1 x Per Day/30 Days Discharge Instructions: Apply over primary dressing as directed. Secured With: [employer], 4.5x3.1 (in/yd) (Generic) 1 x Per Day/30 Days Discharge Instructions: Secure with Kerlix as directed. Secured With: 22M Medipore H Soft Cloth Surgical T ape, 4 x 10 (in/yd) (Generic) 1 x Per Day/30 Days Discharge Instructions: Secure with tape as directed. Compression Wrap: TUBIGRIP SIZE E 1 x Per Day/30 Days Discharge Instructions: apply in the morning and remove at night. ONE LAYER DO NOT DOUBLE!!! Wound #5 - Lower Leg Wound Laterality: Left, Anterior, Distal Cleanser: Wound Cleanser (Generic) 1 x Per Day/30 Days Discharge Instructions: Cleanse the  wound with wound cleanser prior to applying a clean dressing using gauze sponges, not tissue or cotton balls. Prim Dressing: Hydrofera Blue Ready Foam, 4x5 in (Generic) 1 x Per Day/30 Days ary Discharge Instructions: Apply to wound bed as instructed Prim Dressing: MediHoney Gel, tube 1.5 (oz) 1 x Per Day/30 Days ary Discharge Instructions: Apply to wound bed as instructed Secondary Dressing: ABD Pad, 8x10 1 x Per Day/30 Days Discharge Instructions: Apply over primary dressing as directed. Secured With: [employer], 4.5x3.1 (in/yd) (Generic) 1 x Per Day/30 Days Discharge Instructions: Secure with Kerlix as directed. Secured With: 22M Medipore H Soft Cloth Surgical T ape, 4 x 10 (in/yd) (Generic) 1 x Per Day/30 Days Discharge Instructions: Secure with tape as directed. Compression Wrap: TUBIGRIP SIZE E 1 x Per Day/30 Days Discharge Instructions: apply in the morning and remove at night. ONE LAYER DO NOT DOUBLE!!! Services and Therapies rterial Studies- Bilateral - STAT arterial studies with ABIs and TBIs related to positive pulses, noncompressible in clinic. Patient has a nonhealing A wound to left lower leg. Electronic Signature(s) Signed: 06/13/2022 2:58:01 PM By: Kalman Shan DO Entered By: Kalman Shan on 06/13/2022 14:03:48 Prescription 06/13/2022 -------------------------------------------------------------------------------- Luvenia Redden DO Patient Name: Provider: DORETHY, TOMEY ([id number]) [id number] Page 4 of 9 06/02/1946 [id number] Date of Birth: NPI#: [id number] Sex: DEA #: [id number] [id number] Phone #: License #: [place] Patient Address: [address] [place], [place] [address] [address], [place] [address] [phone] Allergies cefdinir; erythromycin base; lactulose; mesalamine; nitrofuran derivative; nitrofurantoin; lettuce;  Sulfa (Sulfonamide Antibiotics); Adacel(Tdap Adolesn/Adult)(PF); tetanus toxoid, adsorbed Provider's Orders rterial Studies- Bilateral - STAT arterial studies with ABIs and TBIs related to positive pulses, noncompressible in clinic. Patient has a  nonhealing A wound to left lower leg. Hand Signature: Date(s): Electronic Signature(s) Signed: 06/13/2022 2:58:01 PM By: Kalman Shan DO Entered By: Kalman Shan on 06/13/2022 14:03:49 -------------------------------------------------------------------------------- Problem List Details Patient Name: Date of Service: Hailey Courser, DO [name] 06/13/2022 1:15 PM Medical Record Number: [id number] Patient Account Number: [id number] Date of Birth/Sex: Treating RN: 09-23-1945 (76 y.o. F) Primary Care Provider: Lorene Dy Other Clinician: Referring Provider: Treating Provider/Extender: [name] in Treatment: 1 Active Problems ICD-10 Encounter Code Description Active Date MDM Diagnosis L97.828 Non-pressure chronic ulcer of other part of left lower leg with other specified 06/03/2022 No Yes severity I87.312 Chronic venous hypertension (idiopathic) with ulcer of left lower extremity 06/03/2022 No Yes J84.9 Interstitial pulmonary disease, unspecified 06/03/2022 No Yes M06.9 Rheumatoid arthritis, unspecified 06/03/2022 No Yes Inactive Problems Resolved Problems Electronic Signature(s) Signed: 06/13/2022 2:58:01 PM By: Kalman Shan DO Entered By: Kalman Shan on 06/13/2022 14:01:46 [name] ([id number]) [id number] Page 5 of 9 -------------------------------------------------------------------------------- Progress Note Details Patient Name: Date of Service: [name] [name] 06/13/2022 1:15 PM Medical Record Number: [id number] Patient Account Number: [id number] Date of Birth/Sex: Treating RN: 15-Sep-1945 (76 y.o. F) Primary Care Provider: Lorene Dy Other  Clinician: Referring Provider: Treating Provider/Extender: [name] in Treatment: 1 Subjective Chief Complaint Information obtained from Patient 06/03/2022; Left lower extremity wounds History of Present Illness (HPI) Patient presents today for initial evaluation and our clinic as a referral from the [facility] Department of orthopedics for evaluation and treatment of the wound to his his of the left foot at the base of the great toe. Currently the good news is the patient really does not have any significant pain which is excellent. She has been seen by Dr. Linus Salmons at [facility]: infectious disease clinic that is the regional Center for infectious disease. Subsequently the patient was cultured and positive for methicillin sensitive staph aureus. She has been on antibiotics including Cipro prior to surgery as will several days following surgery. She then had clindamycin which was changed to doxycycline. She took this for 10 days. Subsequently the pain had improved and there was no longer any possible draining from the wound according to the notes. The patient sedentary rate as well as C-reactive protein have returned to normal ranges. Currently per Dr. Henreitta Leber assessment there was one dehiscence with no sign of osteomyelitis. He therefore discontinue the antibiotics with the completion of the last three days Of doxycycline. Other than that he just set her for a follow-up as needed. The patient does not have diabetes and is not a current smoker. At this time her treatment that was recommended by the surgeon was applying a Hydrocolloid dressing. Based on what I'm seeing at this point the patient actually has some Slough noted over the surface of the wound there really does not appear to be evidence of infection though I do think she does require some sharp debridement to clear away the slough and help with appropriate wound healing. She does have areas of  granulation buds noted. She may be a candidate for a skin substitute as well. We will see how things do over the next period of time until follow-up. No fevers, chills, nausea, or vomiting noted at this time. 06/13/18 on evaluation today patient actually appears to be doing better in regard to her toe ulcer. She's been using the Prisma on this region and that seems to have done extremely well for her. Fortunately there  does not appear to be evidence of infection at this time which is good news. She did see her surgeon they were extremely pleased with the overall appearance of her wound. 06/27/18 on evaluation today patient actually appears to be doing very well in regard to her foot ulcer. She has been tolerating the dressing changes without complication which is great news. She did see her podiatrist they felt like everything is looking very nice as well and will please. 07/05/2018 surgical wound on the left foot. She appears to be doing well. Still surface debrided to remove however in general post debridement the wound bed looks quite healthy it is come down significantly in terms of dimensions using silver collagen. The patient describes pain when her foot is elevated either in bed at night [sometimes keeps her awake] or when is propped up on a foot rest. She basically takes analgesics for this. She does not really describe claudication with activity however her activity is very limited by interstitial lung disease. Her ABIs initially in this clinic were noncompressible. She is not a diabetic 07/20/2018; surgical wound on the left foot. Wound actually looks quite a bit better than I remember seeing this. She is still describing pain with her leg elevated at night that she does not get when the wound is supine. She does not really describe claudication but she is very limited by her pulmonary status. We have been using silver collagen. 1/17; the patient has been followed up by orthopedics and  discharge. Her arterial studies were actually quite good and should not be contributing to any pain. Slight reduction in ABIs but otherwise normal. Her wound is closed. She saw Dr. Linus Salmons of infectious disease surrounding the surgery. By review of our notes she was not felt to have osteomyelitis. 06/03/2022 Ms. Whitnee Orzel is a 76 year old female with a past medical history of interstitial lung disease on chronic oxygen via nasal cannula, rheumatoid arthritis, chronic diastolic heart failure and OSA that presents to the clinic for a 1 to 49-monthhistory of nonhealing ulcer to the left lower extremity. On 04/20/2022 she was admitted to the hospital for left lower extremity cellulitis. She required 8 days of IV vancomycin and Unasyn followed by a 14-day course of Augmentin and doxycycline. She has been using Silvadene cream to the area. She does not use compression therapy but does own compression stockings. She currently denies signs of infection. 11/27; patient presents for follow-up she is using Medihoney and Hydrofera Blue to the wound beds. She has been using her Tubigrip. She has no issues or complaints today. Patient History Information obtained from Patient. Family History Cancer - Maternal Grandparents, Diabetes - Siblings, Heart Disease - Maternal Grandparents,Paternal Grandparents,Mother,Father,Siblings, Hypertension - Mother,Father,Siblings, Stroke - Paternal Grandparents, No family history of Hereditary Spherocytosis, Kidney Disease, Lung Disease, Seizures, Thyroid Problems, Tuberculosis. Social History Never smoker, Marital Status - Married, Alcohol Use - Moderate, Drug Use - No History, Caffeine Use - Moderate. Medical History Eyes Denies history of Cataracts, Optic Neuritis Ear/Nose/Mouth/Throat Denies history of Chronic sinus problems/congestion Hematologic/Lymphatic Denies history of Anemia, Hemophilia, Human Immunodeficiency Virus, Lymphedema, Sickle Cell Disease CCHARLISSA, PETROS([id number] Page 6 of 9 Respiratory Patient has history of Asthma, Sleep Apnea Denies history of Aspiration, Chronic Obstructive Pulmonary Disease (COPD), Pneumothorax, Tuberculosis Cardiovascular Patient has history of Hypertension Denies history of Angina, Arrhythmia, Congestive Heart Failure, Coronary Artery Disease, Deep Vein Thrombosis, Hypotension, Myocardial Infarction, Peripheral Arterial Disease, Peripheral Venous Disease, Phlebitis, Vasculitis Gastrointestinal Patient has history of Colitis  Denies history of Cirrhosis , Crohnoos, Hepatitis A, Hepatitis B, Hepatitis C Endocrine Denies history of Type I Diabetes, Type II Diabetes Immunological Denies history of Lupus Erythematosus, Raynaudoos, Scleroderma Integumentary (Skin) Denies history of History of Burn Musculoskeletal Patient has history of Rheumatoid Arthritis, Osteoarthritis Denies history of Gout, Osteomyelitis Neurologic Denies history of Dementia, Neuropathy, Quadriplegia, Paraplegia, Seizure Disorder Oncologic Denies history of Received Chemotherapy, Received Radiation Psychiatric Denies history of Anorexia/bulimia, Confinement Anxiety Hospitalization/Surgery History - surgery toe. - right hip replacement 12/2018. - heart cath 12/17/2021. Medical A Surgical History Notes nd Respiratory 4L [place] 02 pulmonary fibrosis Objective Constitutional respirations regular, non-labored and within target range for patient.. Vitals Time Taken: 1:26 PM, Height: 61 in, Weight: 186 lbs, BMI: 35.1, Temperature: 98 F, Pulse: 105 bpm, Respiratory Rate: 18 breaths/min, Blood Pressure: 145/74 mmHg. Cardiovascular 2+ dorsalis pedis/posterior tibialis pulses. Psychiatric pleasant and cooperative. General Notes: T the lateral aspect of the left lower extremity there are 2 open wounds with nonviable tissue and granulation tissue. 2+ pitting edema to the o knee. Obvious deformity to  the foot from previous surgeries. No signs of surrounding soft tissue infection. Integumentary (Hair, Skin) Wound #4 status is Open. Original cause of wound was Gradually Appeared. The date acquired was: 04/19/2022. The wound has been in treatment 1 weeks. The wound is located on the Left,Proximal,Anterior Lower Leg. The wound measures 2.6cm length x 2.1cm width x 0.3cm depth; 4.288cm^2 area and 1.286cm^3 volume. There is Fat Layer (Subcutaneous Tissue) exposed. There is no tunneling or undermining noted. There is a medium amount of serosanguineous drainage noted. The wound margin is distinct with the outline attached to the wound base. There is small (1-33%) red, pink granulation within the wound bed. There is a large (67-100%) amount of necrotic tissue within the wound bed including Adherent Slough. The periwound skin appearance did not exhibit: Callus, Crepitus, Excoriation, Induration, Rash, Scarring, Dry/Scaly, Maceration, Atrophie Blanche, Cyanosis, Ecchymosis, Hemosiderin Staining, Mottled, Pallor, Rubor, Erythema. Periwound temperature was noted as No Abnormality. The periwound has tenderness on palpation. Wound #5 status is Open. Original cause of wound was Gradually Appeared. The date acquired was: 04/19/2022. The wound has been in treatment 1 weeks. The wound is located on the [facility] Lower Leg. The wound measures 5.9cm length x 3.4cm width x 0.2cm depth; 15.755cm^2 area and 3.151cm^3 volume. There is Fat Layer (Subcutaneous Tissue) exposed. There is no tunneling or undermining noted. There is a medium amount of serosanguineous drainage noted. The wound margin is distinct with the outline attached to the wound base. There is no granulation within the wound bed. There is a large (67- 100%) amount of necrotic tissue within the wound bed including Eschar and Adherent Slough. The periwound skin appearance did not exhibit: Callus, Crepitus, Excoriation, Induration, Rash, Scarring,  Dry/Scaly, Maceration, Atrophie Blanche, Cyanosis, Ecchymosis, Hemosiderin Staining, Mottled, Pallor, Rubor, Erythema. Periwound temperature was noted as No Abnormality. The periwound has tenderness on palpation. Assessment Active Problems ICD-10 Non-pressure chronic ulcer of other part of left lower leg with other specified severity Chronic venous hypertension (idiopathic) with ulcer of left lower extremity Interstitial pulmonary disease, unspecified Rheumatoid arthritis, unspecified TONNIA, BARDIN ([id number]) [id number] Page 7 of 9 Patient's wounds are stable however there is more granulation tissue present. Due to the appearance I am concerned that there may be an arterial component affecting healing. Her ABIs last week were noncompressible (however had palpable pedal pulses) and we will order arterial studies with ABIs and TBI's. No signs of surrounding infection. Continue Hydrofera  Blue and Medihoney under Tubigrip. Plan Follow-up Appointments: Return Appointment in 1 week. - Dr. Heber [place] Return Appointment in 2 weeks. - Dr. Heber [place] Other: - Dr. Gwenlyn Found office will call you to schedule an appt. Anesthetic: (In clinic) Topical Lidocaine 5% applied to wound bed Bathing/ Shower/ Hygiene: May shower with protection but do not get wound dressing(s) wet. Edema Control - Lymphedema / SCD / Other: Elevate legs to the level of the heart or above for 30 minutes daily and/or when sitting, a frequency of: - 3-4 times a day throughout the day. Avoid standing for long periods of time. Services and Therapies ordered were: Arterial Studies- Bilateral - STAT arterial studies with ABIs and TBIs related to positive pulses, noncompressible in clinic. Patient has a nonhealing wound to left lower leg. WOUND #4: - Lower Leg Wound Laterality: Left, Anterior, Proximal Cleanser: Wound Cleanser (Generic) 1 x Per Day/30 Days Discharge Instructions: Cleanse the wound with wound  cleanser prior to applying a clean dressing using gauze sponges, not tissue or cotton balls. Prim Dressing: Hydrofera Blue Ready Foam, 4x5 in (Generic) 1 x Per Day/30 Days ary Discharge Instructions: Apply to wound bed as instructed Prim Dressing: MediHoney Gel, tube 1.5 (oz) 1 x Per Day/30 Days ary Discharge Instructions: Apply to wound bed as instructed Secondary Dressing: ABD Pad, 8x10 1 x Per Day/30 Days Discharge Instructions: Apply over primary dressing as directed. Secured With: [employer], 4.5x3.1 (in/yd) (Generic) 1 x Per Day/30 Days Discharge Instructions: Secure with Kerlix as directed. Secured With: 2M Medipore H Soft Cloth Surgical T ape, 4 x 10 (in/yd) (Generic) 1 x Per Day/30 Days Discharge Instructions: Secure with tape as directed. Com pression Wrap: TUBIGRIP SIZE E 1 x Per Day/30 Days Discharge Instructions: apply in the morning and remove at night. ONE LAYER DO NOT DOUBLE!!! WOUND #5: - Lower Leg Wound Laterality: Left, Anterior, Distal Cleanser: Wound Cleanser (Generic) 1 x Per Day/30 Days Discharge Instructions: Cleanse the wound with wound cleanser prior to applying a clean dressing using gauze sponges, not tissue or cotton balls. Prim Dressing: Hydrofera Blue Ready Foam, 4x5 in (Generic) 1 x Per Day/30 Days ary Discharge Instructions: Apply to wound bed as instructed Prim Dressing: MediHoney Gel, tube 1.5 (oz) 1 x Per Day/30 Days ary Discharge Instructions: Apply to wound bed as instructed Secondary Dressing: ABD Pad, 8x10 1 x Per Day/30 Days Discharge Instructions: Apply over primary dressing as directed. Secured With: [employer], 4.5x3.1 (in/yd) (Generic) 1 x Per Day/30 Days Discharge Instructions: Secure with Kerlix as directed. Secured With: 2M Medipore H Soft Cloth Surgical T ape, 4 x 10 (in/yd) (Generic) 1 x Per Day/30 Days Discharge Instructions: Secure with tape as directed. Com pression Wrap: TUBIGRIP SIZE E 1 x Per Day/30  Days Discharge Instructions: apply in the morning and remove at night. ONE LAYER DO NOT DOUBLE!!! 1. Medihoney and Hydrofera Blue 2. Tubigrip 3. Follow-up in 1 week 4. Arterial studies with ABIs and TBI's Electronic Signature(s) Signed: 06/13/2022 2:58:01 PM By: Kalman Shan DO Entered By: Kalman Shan on 06/13/2022 14:07:01 -------------------------------------------------------------------------------- HxROS Details Patient Name: Date of Service: [name], DO [name] 06/13/2022 1:15 PM Medical Record Number: [id number] Patient Account Number: [id number] Date of Birth/Sex: Treating RN: 1946/01/04 (76 y.o. F) Primary Care Provider: Lorene Dy Other Clinician: Referring Provider: Treating Provider/Extender: [name] in Treatment: [place], Arvil Persons ([id number]) [id number] Page 8 of 9 Information Obtained From Patient Eyes Medical History: Negative for: Cataracts; Optic Neuritis  Ear/Nose/Mouth/Throat Medical History: Negative for: Chronic sinus problems/congestion Hematologic/Lymphatic Medical History: Negative for: Anemia; Hemophilia; Human Immunodeficiency Virus; Lymphedema; Sickle Cell Disease Respiratory Medical History: Positive for: Asthma; Sleep Apnea Negative for: Aspiration; Chronic Obstructive Pulmonary Disease (COPD); Pneumothorax; Tuberculosis Past Medical History Notes: 4L [place] 02 pulmonary fibrosis Cardiovascular Medical History: Positive for: Hypertension Negative for: Angina; Arrhythmia; Congestive Heart Failure; Coronary Artery Disease; Deep Vein Thrombosis; Hypotension; Myocardial Infarction; Peripheral Arterial Disease; Peripheral Venous Disease; Phlebitis; Vasculitis Gastrointestinal Medical History: Positive for: Colitis Negative for: Cirrhosis ; Crohns; Hepatitis A; Hepatitis B; Hepatitis C Endocrine Medical History: Negative for: Type I Diabetes; Type II  Diabetes Immunological Medical History: Negative for: Lupus Erythematosus; Raynauds; Scleroderma Integumentary (Skin) Medical History: Negative for: History of Burn Musculoskeletal Medical History: Positive for: Rheumatoid Arthritis; Osteoarthritis Negative for: Gout; Osteomyelitis Neurologic Medical History: Negative for: Dementia; Neuropathy; Quadriplegia; Paraplegia; Seizure Disorder Oncologic Medical History: Negative for: Received Chemotherapy; Received Radiation Psychiatric Medical History: Negative for: Anorexia/bulimia; Confinement Anxiety Immunizations MARYFRANCES, [place] ([id number]) [id number] Page 9 of 9 Pneumococcal Vaccine: Received Pneumococcal Vaccination: Yes Received Pneumococcal Vaccination On or After 60th Birthday: Yes Implantable Devices No devices added Hospitalization / Surgery History Type of Hospitalization/Surgery surgery toe right hip replacement 12/2018 heart cath 12/17/2021 Family and Social History Cancer: Yes - Maternal Grandparents; Diabetes: Yes - Siblings; Heart Disease: Yes - Maternal Grandparents,Paternal Grandparents,Mother,Father,Siblings; Hereditary Spherocytosis: No; Hypertension: Yes - Mother,Father,Siblings; Kidney Disease: No; Lung Disease: No; Seizures: No; Stroke: Yes - Paternal Grandparents; Thyroid Problems: No; Tuberculosis: No; Never smoker; Marital Status - Married; Alcohol Use: Moderate; Drug Use: No History; Caffeine Use: Moderate; Financial Concerns: No; Food, Clothing or Shelter Needs: No; Support System Lacking: No; Transportation Concerns: No Electronic Signature(s) Signed: 06/13/2022 2:58:01 PM By: Kalman Shan DO Entered By: Kalman Shan on 06/13/2022 14:02:42 -------------------------------------------------------------------------------- SuperBill Details Patient Name: Date of Service: [name], DO [name] 06/13/2022 Medical Record Number: [id number] Patient Account Number:  [id number] Date of Birth/Sex: Treating RN: July 04, 1946 (76 y.o. F) Primary Care Provider: Lorene Dy Other Clinician: Referring Provider: Treating Provider/Extender: [name] in Treatment: 1 Diagnosis Coding ICD-10 Codes Code Description [id number] Non-pressure chronic ulcer of other part of left lower leg with other specified severity I87.312 Chronic venous hypertension (idiopathic) with ulcer of left lower extremity J84.9 Interstitial pulmonary disease, unspecified M06.9 Rheumatoid arthritis, unspecified Physician Procedures : CPT4 Code Description Modifier [id number] - WC PHYS LEVEL 3 - EST PT ICD-10 Diagnosis Description L97.828 Non-pressure chronic ulcer of other part of left lower leg with other specified severity I87.312 Chronic venous hypertension (idiopathic) with  ulcer of left lower extremity J84.9 Interstitial pulmonary disease, unspecified M06.9 Rheumatoid arthritis, unspecified Quantity: 1 Electronic Signature(s) Signed: 06/13/2022 2:58:01 PM By: Kalman Shan DO Entered By: Kalman Shan on 06/13/2022 14:07:24

## 2022-06-14 NOTE — Progress Notes (Signed)
JAMICA, WOODYARD ([id number]) [id number] Page 1 of 10 Visit Report for 06/13/2022 Arrival Information Details Patient Name: Date of Service: Hailey Courser, DO [name] 06/13/2022 1:15 PM Medical Record Number: [id number] Patient Account Number: [id number] Date of Birth/Sex: Treating RN: 07-06-1946 (76 y.o. F) Primary Care [place] Dube: Lorene Dy Other Clinician: Referring Jin Shockley: Treating Nira Visscher/Extender: [name] in Treatment: 1 Visit Information History Since Last Visit Added or deleted any medications: No Patient Arrived: Wheel Chair Any new allergies or adverse reactions: No Arrival Time: 13:23 Had a fall or experienced change in No Accompanied By: husband activities of daily living that may affect Transfer Assistance: None risk of falls: Patient Identification Verified: Yes Signs or symptoms of abuse/neglect since last visito No Secondary Verification Process Completed: Yes Hospitalized since last visit: No Patient Requires Transmission-Based Precautions: No Implantable device outside of the clinic excluding No Patient Has Alerts: Yes cellular tissue based products placed in the center Patient Alerts: ABI's: L: N/C 11/23 since last visit: Has Dressing in Place as Prescribed: Yes Pain Present Now: No Electronic Signature(s) Signed: 06/14/2022 4:06:07 PM By: Erenest Blank Entered By: Erenest Blank on 06/13/2022 13:26:07 -------------------------------------------------------------------------------- Clinic Level of Care Assessment Details Patient Name: Date of Service: Hailey Courser, DO [name] 06/13/2022 1:15 PM Medical Record Number: [id number] Patient Account Number: [id number] Date of Birth/Sex: Treating RN: 01-27-1946 (76 y.o. [name] Primary Care Teryl Mcconaghy: Lorene Dy Other Clinician: Referring Johnice Riebe: Treating Emori Kamau/Extender: [name] in Treatment: 1 Clinic Level  of Care Assessment Items TOOL 4 Quantity Score X- 1 0 Use when only an EandM is performed on FOLLOW-UP visit ASSESSMENTS - Nursing Assessment / Reassessment X- 1 10 Reassessment of Co-morbidities (includes updates in patient status) X- 1 5 Reassessment of Adherence to Treatment Plan ASSESSMENTS - Wound and Skin A ssessment / Reassessment _0  - 0 Simple Wound Assessment / Reassessment - one wound X- 2 5 Complex Wound Assessment / Reassessment - multiple wounds _1  - 0 Dermatologic / Skin Assessment (not related to wound area) ASSESSMENTS - Focused Assessment X- 1 5 Circumferential Edema Measurements - multi extremities _2  - 0 Nutritional Assessment / Counseling / Intervention ARRON, MCNAUGHT ([id number]) [id number] Page 2 of 10 _3  - 0 Lower Extremity Assessment (monofilament, tuning fork, pulses) _4  - 0 Peripheral Arterial Disease Assessment (using hand held doppler) ASSESSMENTS - Ostomy and/or Continence Assessment and Care _5  - 0 Incontinence Assessment and Management _6  - 0 Ostomy Care Assessment and Management (repouching, etc.) PROCESS - Coordination of Care _7  - 0 Simple Patient / Family Education for ongoing care X- 1 20 Complex (extensive) Patient / Family Education for ongoing care X- 1 10 Staff obtains Programmer, systems, Records, T Results / Process Orders est _8  - 0 Staff telephones HHA, Nursing Homes / Clarify orders / etc _9  - 0 Routine Transfer to another Facility (non-emergent condition) _10  - 0 Routine Hospital Admission (non-emergent condition) _11  - 0 New Admissions / Biomedical engineer / Ordering NPWT Apligraf, etc. , _12  - 0 Emergency Hospital Admission (emergent condition) _13  - 0 Simple Discharge Coordination X- 1 15 Complex (extensive) Discharge Coordination PROCESS - Special Needs _14  - 0 Pediatric / Minor Patient Management _15  - 0 Isolation Patient Management _16  - 0 Hearing / Language / Visual special needs _17  -  0 Assessment of Community assistance (transportation, D/C planning, etc.) _18  - 0 Additional assistance / Altered mentation _19  - 0 Support Surface(s) Assessment (bed, cushion, seat, etc.) INTERVENTIONS - Wound Cleansing /  Measurement _0  - 0 Simple Wound Cleansing - one wound X- 2 5 Complex Wound Cleansing - multiple wounds X- 1 5 Wound Imaging (photographs - any number of wounds) _1  - 0 Wound Tracing (instead of photographs) _2  - 0 Simple Wound Measurement - one wound X- 2 5 Complex Wound Measurement - multiple wounds INTERVENTIONS - Wound Dressings _3  - 0 Small Wound Dressing one or multiple wounds X- 1 15 Medium Wound Dressing one or multiple wounds _4  - 0 Large Wound Dressing one or multiple wounds <GLOVFIEPPIRJJOAC>_1<\/YSAYTKZSWFUXNATF>_5  - 0 Application of Medications - topical <DDUKGURKYHCWCBJS>_2<\/GBTDVVOHYWVPXTGG>_2  - 0 Application of Medications - injection INTERVENTIONS - Miscellaneous _7  - 0 External ear exam _8  - 0 Specimen Collection (cultures, biopsies, blood, body fluids, etc.) _9  - 0 Specimen(s) / Culture(s) sent or taken to Lab for analysis _10  - 0 Patient Transfer (multiple staff / Civil Service fast streamer / Similar devices) _11  - 0 Simple Staple / Suture removal (25 or less) _12  - 0 Complex Staple / Suture removal (26 or more) _13  - 0 Hypo / Hyperglycemic Management (close monitor of Blood Glucose) MAHNOOR, MATHISEN ([id number]) [id number] Page 3 of 10 _14  - 0 Ankle / Brachial Index (ABI) - do not check if billed separately X- 1 5 Vital Signs Has the patient been seen at the hospital within the last three years: Yes Total Score: 120 Level Of Care: New/Established - Level 4 Electronic Signature(s) Signed: 06/13/2022 6:45:57 PM By: Deon Pilling RN, BSN Entered By: Deon Pilling on 06/13/2022 17:30:29 -------------------------------------------------------------------------------- Encounter Discharge Information Details Patient Name: Date of Service: Hailey Courser, DO [name] 06/13/2022 1:15 PM Medical Record Number:  [id number] Patient Account Number: [id number] Date of Birth/Sex: Treating RN: [birth year] (76 y.o. [name], [name] Primary Care Jadeyn Hargett: Lorene Dy Other Clinician: Referring Trevaughn Schear: Treating Aceton Kinnear/Extender: [name] in Treatment: 1 Encounter Discharge Information Items Discharge Condition: Stable Ambulatory Status: Wheelchair Discharge Destination: Home Transportation: Private Auto Accompanied By: husband Schedule Follow-up Appointment: Yes Clinical Summary of Care: Electronic Signature(s) Signed: 06/13/2022 6:45:57 PM By: Deon Pilling RN, BSN Entered By: Deon Pilling on 06/13/2022 17:31:15 -------------------------------------------------------------------------------- Lower Extremity Assessment Details Patient Name: Date of Service: [name], DO [name] 06/13/2022 1:15 PM Medical Record Number: [id number] Patient Account Number: [id number] Date of Birth/Sex: Treating RN: 1946/07/04 (76 y.o. F) Primary Care Gerald Honea: Lorene Dy Other Clinician: Referring Kele Barthelemy: Treating Murna Backer/Extender: [name] in Treatment: 1 Edema Assessment Assessed: [Left: No] [Right: No] Edema: [Left: Ye] [Right: s] Calf Left: Right: Point of Measurement: 29 cm From Medial Instep 29.7 cm Ankle Left: Right: Point of Measurement: 9 cm From Medial Instep 23.5 cm Electronic Signature(s) Signed: 06/14/2022 4:06:07 PM By: Raye Sorrow, Arvil Persons ([id number]) PM By: Erenest Blank [phone].pdf Page 4 of 10 Signed: 06/14/2022 4:06:07 Entered By: Erenest Blank on 06/13/2022 13:32:35 -------------------------------------------------------------------------------- Multi Wound Chart Details Patient Name: Date of Service: [name] [name] 06/13/2022 1:15 PM Medical Record Number: [id number] Patient Account Number: [id number] Date of Birth/Sex: Treating RN: 04/22/1946 (76 y.o. F) Primary Care  Micheal Sheen: Lorene Dy Other Clinician: Referring Macrina Lehnert: Treating Devoiry Corriher/Extender: [name] in Treatment: 1 Vital Signs Height(in): 61 Pulse(bpm): 105 Weight(lbs): 186 Blood Pressure(mmHg): 145/74 Body Mass Index(BMI): 35.1 Temperature(F): 98 Respiratory Rate(breaths/min): 18 [4:Photos:] [N/A:N/A] Left, Proximal, Anterior Lower Leg Left, Distal, Anterior Lower Leg N/A Wound Location: Gradually Appeared Gradually Appeared N/A Wounding Event: Cellulitis Cellulitis N/A Primary Etiology: Asthma, Sleep Apnea, Hypertension, Asthma, Sleep Apnea, Hypertension, N/A  Comorbid History: Colitis, Rheumatoid Arthritis, Colitis, Rheumatoid Arthritis, Osteoarthritis Osteoarthritis 04/19/2022 04/19/2022 N/A Date Acquired: 1 1 N/A Weeks of Treatment: Open Open N/A Wound Status: No No N/A Wound Recurrence: 2.6x2.1x0.3 5.9x3.4x0.2 N/A Measurements L x W x D (cm) 4.288 15.755 N/A A (cm) : rea 1.286 3.151 N/A Volume (cm) : -1.10% -1.30% N/A % Reduction in Area: -1.10% -102.60% N/A % Reduction in Volume: Full Thickness With Exposed Support Full Thickness With Exposed Support N/A Classification: Structures Structures Medium Medium N/A Exudate A mount: Serosanguineous Serosanguineous N/A Exudate Type: red, brown red, brown N/A Exudate Color: Distinct, outline attached Distinct, outline attached N/A Wound Margin: Small (1-33%) None Present (0%) N/A Granulation Amount: Red, Pink N/A N/A Granulation Quality: Large (67-100%) Large (67-100%) N/A Necrotic Amount: Adherent Slough Eschar, Adherent Slough N/A Necrotic Tissue: Fat Layer (Subcutaneous Tissue): Yes Fat Layer (Subcutaneous Tissue): Yes N/A Exposed Structures: Fascia: No Fascia: No Tendon: No Tendon: No Muscle: No Muscle: No Joint: No Joint: No Bone: No Bone: No None None N/A Epithelialization: Excoriation: No Excoriation: No N/A Periwound Skin Texture: Induration:  No Induration: No Callus: No Callus: No Crepitus: No Crepitus: No Rash: No Rash: No Scarring: No Scarring: No Maceration: No Maceration: No N/A Periwound Skin Moisture: Dry/Scaly: No Dry/Scaly: No Atrophie Blanche: No Atrophie Blanche: No N/A Periwound Skin Color: Cyanosis: No Cyanosis: No Ecchymosis: No Ecchymosis: No TONANTZIN, MIMNAUGH ([id number]) [id number] Page 5 of 10 Erythema: No Erythema: No Hemosiderin Staining: No Hemosiderin Staining: No Mottled: No Mottled: No Pallor: No Pallor: No Rubor: No Rubor: No No Abnormality No Abnormality N/A Temperature: Yes Yes N/A Tenderness on Palpation: Treatment Notes Electronic Signature(s) Signed: 06/13/2022 2:58:01 PM By: Kalman Shan DO Entered By: Kalman Shan on 06/13/2022 14:01:51 -------------------------------------------------------------------------------- Multi-Disciplinary Care Plan Details Patient Name: Date of Service: Hailey Courser, DO [name] 06/13/2022 1:15 PM Medical Record Number: [id number] Patient Account Number: [id number] Date of Birth/Sex: Treating RN: 12-25-1945 (76 y.o. [name], [name] Primary Care Mikayah Joy: Lorene Dy Other Clinician: Referring Thena Devora: Treating Jaycen Vercher/Extender: [name] in Treatment: 1 Active Inactive Pain, Acute or Chronic Nursing Diagnoses: Pain, acute or chronic: actual or potential Potential alteration in comfort, pain Goals: Patient will verbalize adequate pain control and receive pain control interventions during procedures as needed Date Initiated: 06/03/2022 Target Resolution Date: 06/24/2022 Goal Status: Active Patient/caregiver will verbalize comfort level met Date Initiated: 06/03/2022 Target Resolution Date: 06/23/2022 Goal Status: Active Interventions: Encourage patient to take pain medications as prescribed Provide education on pain management Treatment Activities: Administer pain  control measures as ordered : 06/03/2022 Notes: Venous Leg Ulcer Nursing Diagnoses: Potential for venous Insuffiency (use before diagnosis confirmed) Goals: Patient will maintain optimal edema control Date Initiated: 06/03/2022 Target Resolution Date: 06/24/2022 Goal Status: Active Interventions: Assess peripheral edema status every visit. Compression as ordered Provide education on venous insufficiency Treatment Activities: Therapeutic compression applied : 06/03/2022 LANDREE, FERNHOLZ ([id number]) [id number] Page 6 of 10 Notes: Electronic Signature(s) Signed: 06/13/2022 6:45:57 PM By: Deon Pilling RN, BSN Entered By: Deon Pilling on 06/13/2022 13:59:59 -------------------------------------------------------------------------------- Pain Assessment Details Patient Name: Date of Service: Hailey Courser, DO [name] 06/13/2022 1:15 PM Medical Record Number: [id number] Patient Account Number: [id number] Date of Birth/Sex: Treating RN: 1945-11-20 (76 y.o. F) Primary Care [place] Arch: Lorene Dy Other Clinician: Referring [place] Spees: Treating Danial Hlavac/Extender: [name] in Treatment: 1 Active Problems Location of Pain Severity and Description of Pain Patient Has Paino No Site Locations Pain Management and Medication Current Pain Management:  Electronic Signature(s) Signed: 06/14/2022 4:06:07 PM By: Erenest Blank Entered By: Erenest Blank on 06/13/2022 13:26:40 -------------------------------------------------------------------------------- Patient/Caregiver Education Details Patient Name: Date of Service: [name], DO RO Marry Guan 11/27/2023andnbsp1:15 PM Medical Record Number: [id number] Patient Account Number: [id number] Date of Birth/Gender: Treating RN: December 29, 1945 (76 y.o. [name], [name] Primary Care Physician: Lorene Dy Other Clinician: Referring Physician: Treating Physician/Extender: [name] in Treatment: 1 Education [place], Arvil Persons ([id number]) [id number] Page 7 of 10 Education Provided To: Patient Education Topics Provided Wound/Skin Impairment: Handouts: Caring for Your Ulcer Methods: Explain/Verbal Responses: Reinforcements needed Electronic Signature(s) Signed: 06/13/2022 6:45:57 PM By: Deon Pilling RN, BSN Entered By: Deon Pilling on 06/13/2022 14:00:41 -------------------------------------------------------------------------------- Wound Assessment Details Patient Name: Date of Service: [name], DO [name] 06/13/2022 1:15 PM Medical Record Number: [id number] Patient Account Number: [id number] Date of Birth/Sex: Treating RN: Jan 01, 1946 (76 y.o. F) Primary Care Ballard Budney: Lorene Dy Other Clinician: Referring Keaundre Thelin: Treating Marguerita Stapp/Extender: [name] in Treatment: 1 Wound Status Wound Number: 4 Primary Cellulitis Etiology: Wound Location: Left, Proximal, Anterior Lower Leg Wound Open Wounding Event: Gradually Appeared Status: Date Acquired: 04/19/2022 Comorbid Asthma, Sleep Apnea, Hypertension, Colitis, Rheumatoid Weeks Of Treatment: 1 History: Arthritis, Osteoarthritis Clustered Wound: No Photos Wound Measurements Length: (cm) 2.6 Width: (cm) 2.1 Depth: (cm) 0.3 Area: (cm) 4.288 Volume: (cm) 1.286 % Reduction in Area: -1.1% % Reduction in Volume: -1.1% Epithelialization: None Tunneling: No Undermining: No Wound Description Classification: Full Thickness With Exposed Suppor Wound Margin: Distinct, outline attached Exudate Amount: Medium Exudate Type: Serosanguineous Exudate Color: red, brown t Structures Foul Odor After Cleansing: No Slough/Fibrino Yes Wound Bed Granulation Amount: Small (1-33%) Exposed Structure Granulation Quality: Red, Pink Fascia Exposed: No Necrotic Amount: Large (67-100%) Fat Layer (Subcutaneous Tissue) Exposed:  Yes Necrotic Quality: Adherent Slough Tendon Exposed: No Muscle Exposed: No KIMBERLY, NIELAND ([id number]) [id number] Page 8 of 10 Joint Exposed: No Bone Exposed: No Periwound Skin Texture Texture Color No Abnormalities Noted: No No Abnormalities Noted: No Callus: No Atrophie Blanche: No Crepitus: No Cyanosis: No Excoriation: No Ecchymosis: No Induration: No Erythema: No Rash: No Hemosiderin Staining: No Scarring: No Mottled: No Pallor: No Moisture Rubor: No No Abnormalities Noted: No Dry / Scaly: No Temperature / Pain Maceration: No Temperature: No Abnormality Tenderness on Palpation: Yes Treatment Notes Wound #4 (Lower Leg) Wound Laterality: Left, Anterior, Proximal Cleanser Wound Cleanser Discharge Instruction: Cleanse the wound with wound cleanser prior to applying a clean dressing using gauze sponges, not tissue or cotton balls. Peri-Wound Care Topical Primary Dressing Hydrofera Blue Ready Foam, 4x5 in Discharge Instruction: Apply to wound bed as instructed MediHoney Gel, tube 1.5 (oz) Discharge Instruction: Apply to wound bed as instructed Secondary Dressing ABD Pad, 8x10 Discharge Instruction: Apply over primary dressing as directed. Secured With [employer], 4.5x3.1 (in/yd) Discharge Instruction: Secure with Kerlix as directed. 57M Medipore H Soft Cloth Surgical T ape, 4 x 10 (in/yd) Discharge Instruction: Secure with tape as directed. Compression Wrap TUBIGRIP SIZE E Discharge Instruction: apply in the morning and remove at night. ONE LAYER DO NOT DOUBLE!!! Compression Stockings Add-Ons Electronic Signature(s) Signed: 06/14/2022 4:06:07 PM By: Erenest Blank Entered By: Erenest Blank on 06/13/2022 13:36:20 -------------------------------------------------------------------------------- Wound Assessment Details Patient Name: Date of Service: Hailey Courser, DO [name] 06/13/2022 1:15 PM Medical Record Number:  [id number] Patient Account Number: [id number] Date of Birth/Sex: Treating RN: 04/12/1946 (76 y.o. F) Primary Care Xander Jutras: Lorene Dy Other Clinician: Referring Quamir Willemsen: Treating Terricka Onofrio/Extender:  Rushie Chestnut Weeks in Treatment: [place], Arvil Persons ([id number]) [id number] Page 9 of 10 Wound Status Wound Number: 5 Primary Cellulitis Etiology: Wound Location: Left, Distal, Anterior Lower Leg Wound Open Wounding Event: Gradually Appeared Status: Date Acquired: 04/19/2022 Comorbid Asthma, Sleep Apnea, Hypertension, Colitis, Rheumatoid Weeks Of Treatment: 1 History: Arthritis, Osteoarthritis Clustered Wound: No Photos Wound Measurements Length: (cm) 5.9 Width: (cm) 3.4 Depth: (cm) 0.2 Area: (cm) 15.755 Volume: (cm) 3.151 % Reduction in Area: -1.3% % Reduction in Volume: -102.6% Epithelialization: None Tunneling: No Undermining: No Wound Description Classification: Full Thickness With Exposed Support Structures Wound Margin: Distinct, outline attached Exudate Amount: Medium Exudate Type: Serosanguineous Exudate Color: red, brown Foul Odor After Cleansing: No Slough/Fibrino Yes Wound Bed Granulation Amount: None Present (0%) Exposed Structure Necrotic Amount: Large (67-100%) Fascia Exposed: No Necrotic Quality: Eschar, Adherent Slough Fat Layer (Subcutaneous Tissue) Exposed: Yes Tendon Exposed: No Muscle Exposed: No Joint Exposed: No Bone Exposed: No Periwound Skin Texture Texture Color No Abnormalities Noted: No No Abnormalities Noted: No Callus: No Atrophie Blanche: No Crepitus: No Cyanosis: No Excoriation: No Ecchymosis: No Induration: No Erythema: No Rash: No Hemosiderin Staining: No Scarring: No Mottled: No Pallor: No Moisture Rubor: No No Abnormalities Noted: No Dry / Scaly: No Temperature / Pain Maceration: No Temperature: No Abnormality Tenderness on Palpation: Yes Treatment Notes Wound #5  (Lower Leg) Wound Laterality: Left, Anterior, Distal Cleanser Wound Cleanser Discharge Instruction: Cleanse the wound with wound cleanser prior to applying a clean dressing using gauze sponges, not tissue or cotton balls. Peri-Wound Care Topical Primary Dressing [address] Ready Foam, 4x5 in [place] ([id number]) [id number] Page 10 of 10 Discharge Instruction: Apply to wound bed as instructed MediHoney Gel, tube 1.5 (oz) Discharge Instruction: Apply to wound bed as instructed Secondary Dressing ABD Pad, 8x10 Discharge Instruction: Apply over primary dressing as directed. Secured With [employer], 4.5x3.1 (in/yd) Discharge Instruction: Secure with Kerlix as directed. 87M Medipore H Soft Cloth Surgical T ape, 4 x 10 (in/yd) Discharge Instruction: Secure with tape as directed. Compression Wrap TUBIGRIP SIZE E Discharge Instruction: apply in the morning and remove at night. ONE LAYER DO NOT DOUBLE!!! Compression Stockings Add-Ons Electronic Signature(s) Signed: 06/14/2022 4:06:07 PM By: Erenest Blank Entered By: Erenest Blank on 06/13/2022 13:36:57 -------------------------------------------------------------------------------- Vitals Details Patient Name: Date of Service: [name], DO [name] 06/13/2022 1:15 PM Medical Record Number: [id number] Patient Account Number: [id number] Date of Birth/Sex: Treating RN: Oct 21, 1945 (76 y.o. F) Primary Care Saniyya Gau: Lorene Dy Other Clinician: Referring Ninah Moccio: Treating Onita Pfluger/Extender: [name] in Treatment: 1 Vital Signs Time Taken: 13:26 Temperature (F): 98 Height (in): 61 Pulse (bpm): 105 Weight (lbs): 186 Respiratory Rate (breaths/min): 18 Body Mass Index (BMI): 35.1 Blood Pressure (mmHg): 145/74 Reference Range: 80 - 120 mg / dl Electronic Signature(s) Signed: 06/14/2022 4:06:07 PM By: Erenest Blank Entered By: Erenest Blank on  06/13/2022 13:26:32

## 2022-06-15 ENCOUNTER — Other Ambulatory Visit: Payer: Medicare Other

## 2022-06-20 ENCOUNTER — Telehealth: Payer: Self-pay | Admitting: Pharmacist

## 2022-06-20 ENCOUNTER — Encounter (HOSPITAL_BASED_OUTPATIENT_CLINIC_OR_DEPARTMENT_OTHER): Payer: Medicare Other | Attending: Internal Medicine | Admitting: Internal Medicine

## 2022-06-20 DIAGNOSIS — M069 Rheumatoid arthritis, unspecified: Secondary | ICD-10-CM | POA: Diagnosis not present

## 2022-06-20 DIAGNOSIS — J849 Interstitial pulmonary disease, unspecified: Secondary | ICD-10-CM | POA: Diagnosis not present

## 2022-06-20 DIAGNOSIS — L97828 Non-pressure chronic ulcer of other part of left lower leg with other specified severity: Secondary | ICD-10-CM | POA: Diagnosis not present

## 2022-06-20 DIAGNOSIS — I87312 Chronic venous hypertension (idiopathic) with ulcer of left lower extremity: Secondary | ICD-10-CM | POA: Insufficient documentation

## 2022-06-20 NOTE — Telephone Encounter (Signed)
Received fax from [facility] requesting refill for Tyvaso DPI. Patient's dosing frequency decrease to three times daily.  Reviewed OV note from 06/07/22. Per note, patient is not taking four times daily due to forgetfulness.   ATC patient to discuss but unable to reach and attempted to leave VM but phone was picked up   Knox Saliva, PharmD, MPH, BCPS, CPP Clinical Pharmacist (Rheumatology and Pulmonology)

## 2022-06-20 NOTE — Progress Notes (Signed)
Hailey, [name] ([id number]) [id number] Page 1 of 9 Visit Report for 06/20/2022 Chief Complaint Document Details Patient Name: Date of Service: [name] RO [facility] S. 06/20/2022 1:45 PM Medical Record Number: [id number] Patient Account Number: [id number] Date of Birth/Sex: Treating RN: 05-03-1946 (77 y.o. F) Primary Care Provider: Lorene [name] Other Clinician: Referring Provider: Treating Provider/Extender: [name] in Treatment: 2 Information Obtained from: Patient Chief Complaint 06/03/2022; Left lower extremity wounds Electronic Signature(s) Signed: 06/20/2022 3:20:41 PM By: Hailey Shan DO Entered By: [name] on 06/20/2022 14:43:47 -------------------------------------------------------------------------------- HPI Details Patient Name: Date of Service: Hailey Courser, DO [name] 06/20/2022 1:45 PM Medical Record Number: [id number] Patient Account Number: [id number] Date of Birth/Sex: Treating RN: Jul 24, 1945 (76 y.o. F) Primary Care Provider: Lorene [name] Other Clinician: Referring Provider: Treating Provider/Extender: [name] in Treatment: 2 History of Present Illness HPI Description: Patient presents today for initial evaluation and our clinic as a referral from the [name] health system Department of orthopedics for evaluation and treatment of the wound to his his of the left foot at the base of the great toe. Currently the good news is the patient really does not have any significant pain which is excellent. She has been seen by [name] at [facility]: infectious disease clinic that is the regional Center for infectious disease. Subsequently the patient was cultured and positive for methicillin sensitive staph aureus. She has been on antibiotics including Cipro prior to surgery as will several days following surgery. She then had clindamycin which was changed to doxycycline. She  took this for 10 days. Subsequently the pain had improved and there was no longer any possible draining from the wound according to the notes. The patient sedentary rate as well as C-reactive protein have returned to normal ranges. Currently per [name] assessment there was one dehiscence with no sign of osteomyelitis. He therefore discontinue the antibiotics with the completion of the last three days Of doxycycline. Other than that he just set her for a follow-up as needed. The patient does not have diabetes and is not a current smoker. At this time her treatment that was recommended by the surgeon was applying a Hydrocolloid dressing. Based on what I'm seeing at this point the patient actually has some Slough noted over the surface of the wound there really does not appear to be evidence of infection though I do think she does require some sharp debridement to clear away the slough and help with appropriate wound healing. She does have areas of granulation buds noted. She may be a candidate for a skin substitute as well. We will see how things do over the next period of time until follow-up. No fevers, chills, nausea, or vomiting noted at this time. 06/13/18 on evaluation today patient actually appears to be doing better in regard to her toe ulcer. She's been using the Prisma on this region and that seems to have done extremely well for her. Fortunately there does not appear to be evidence of infection at this time which is good news. She did see her surgeon they were extremely pleased with the overall appearance of her wound. 06/27/18 on evaluation today patient actually appears to be doing very well in regard to her foot ulcer. She has been tolerating the dressing changes without complication which is great news. She did see her podiatrist they felt like everything is looking very nice as well and will please. 07/05/2018 surgical wound on the left  foot. She appears to be doing well. Still  surface debrided to remove however in general post debridement the wound bed looks quite healthy it is come down significantly in terms of dimensions using silver collagen. The patient describes pain when her foot is elevated either in bed at night [sometimes keeps her awake] or when is propped up on a foot rest. She basically takes analgesics for this. She does not really describe claudication with activity however her activity is very limited by interstitial lung disease. Her ABIs initially in this clinic were noncompressible. She is not a diabetic 07/20/2018; surgical wound on the left foot. Wound actually looks quite a bit better than I remember seeing this. She is still describing pain with her leg elevated at night that she does not get when the wound is supine. She does not really describe claudication but she is very limited by her pulmonary status. We have been using silver collagen. 1/17; the patient has been followed up by orthopedics and discharge. Her arterial studies were actually quite good and should not be contributing to any pain. Slight reduction in ABIs but otherwise normal. Her wound is closed. She saw [name] of infectious disease surrounding the surgery. By review of our notes Hailey, [name] ([id number]) [id number] Page 2 of 9 she was not felt to have osteomyelitis. 06/03/2022 [name] is a 76 year old female with a past medical history of interstitial lung disease on chronic oxygen via nasal cannula, rheumatoid arthritis, chronic diastolic heart failure and OSA that presents to the clinic for a 1 to 68-monthhistory of nonhealing ulcer to the left lower extremity. On 04/20/2022 she was admitted to the hospital for left lower extremity cellulitis. She required 8 days of IV vancomycin and Unasyn followed by a 14-day course of Augmentin and doxycycline. She has been using Silvadene cream to the area. She does not use compression therapy but does  own compression stockings. She currently denies signs of infection. 11/27; patient presents for follow-up she is using Medihoney and Hydrofera [name] to the wound beds. She has been using her Tubigrip. She has no issues or complaints today. 12/4; patient presents for follow-up. She continues to use Medihoney and Hydrofera [name] to the wound beds. Several attempts have been made to schedule ABIs with TBI's. We gave patient the number today to call to have these scheduled. She has been using Tubigrip. She has no issues or complaints today. Electronic Signature(s) Signed: 06/20/2022 3:20:41 PM By: HKalman ShanDO Entered By: HKalman Shanon 06/20/2022 14:44:31 -------------------------------------------------------------------------------- Physical Exam Details Patient Name: Date of Service: CArnetha Courser DO [name] 06/20/2022 1:45 PM Medical Record Number: [id number] Account Number: [id number] of Birth/Sex: Treating RN: 905/07/1945(76y.o. F) Primary Care Provider: RLorene DyOther Clinician: Referring Provider: Treating Provider/Extender: HKrista Bluein Treatment: 2 Constitutional respirations regular, non-labored and within target range for patient.. Cardiovascular 2+ dorsalis pedis/posterior tibialis pulses. Psychiatric pleasant and cooperative. Notes T the lateral aspect of the left lower extremity there are 2 open wounds with nonviable tissue and granulation tissue. 2+ pitting edema to the knee. Obvious o deformity to the foot from previous surgeries. No signs of surrounding soft tissue infection. Electronic Signature(s) Signed: 06/20/2022 3:20:41 PM By: HKalman ShanDO Entered By: HKalman Shanon 06/20/2022 14:45:05 -------------------------------------------------------------------------------- Physician Orders Details Patient Name: Date of Service: [place] 06/20/2022 1:45 PM Medical Record Number:  [id number] Account Number: [id number] of Birth/Sex:  Treating RN: 1946-03-26 (76 y.o. [name], Lauren Primary Care Provider: Lorene [name] Other Clinician: Referring Provider: Treating Provider/Extender: [name] in Treatment: 2 Verbal / Phone Orders: No Diagnosis Coding Follow-up Appointments ppointment in 1 week. - Dr. Heber [place] Return A ppointment in 2 weeks. - Dr. Heber [place] Return [place], Arvil Persons ([id number]) [id number] Page 3 of 9 Other: - Call Dr. Kennon Holter office to schedule appt. for ABI's/TBI's Anesthetic (In clinic) Topical Lidocaine 5% applied to wound bed Bathing/ Shower/ Hygiene May shower with protection but do not get wound dressing(s) wet. Edema Control - Lymphedema / SCD / Other Elevate legs to the level of the heart or above for 30 minutes daily and/or when sitting, a frequency of: - 3-4 times a day throughout the day. Avoid standing for long periods of time. Wound Treatment Wound #4 - Lower Leg Wound Laterality: Left, Anterior, Proximal Cleanser: Wound Cleanser (Generic) 1 x Per Day/30 Days Discharge Instructions: Cleanse the wound with wound cleanser prior to applying a clean dressing using gauze sponges, not tissue or cotton balls. Prim Dressing: MediHoney Gel, tube 1.5 (oz) 1 x Per Day/30 Days ary Discharge Instructions: Apply to wound bed as instructed Prim Dressing: Hydrofera [name] Ready Foam, 4x5 in (Generic) 1 x Per Day/30 Days ary Discharge Instructions: Apply to wound bed as instructed Secondary Dressing: ABD Pad, 8x10 1 x Per Day/30 Days Discharge Instructions: Apply over primary dressing as directed. Secured With: [employer], 4.5x3.1 (in/yd) (Generic) 1 x Per Day/30 Days Discharge Instructions: Secure with Kerlix as directed. Secured With: 28M Medipore H Soft Cloth Surgical T ape, 4 x 10 (in/yd) (Generic) 1 x Per Day/30 Days Discharge Instructions: Secure with tape as  directed. Compression Wrap: TUBIGRIP SIZE E 1 x Per Day/30 Days Discharge Instructions: apply in the morning and remove at night. ONE LAYER DO NOT DOUBLE!!! Wound #5 - Lower Leg Wound Laterality: Left, Anterior, Distal Cleanser: Wound Cleanser (Generic) 1 x Per Day/30 Days Discharge Instructions: Cleanse the wound with wound cleanser prior to applying a clean dressing using gauze sponges, not tissue or cotton balls. Prim Dressing: MediHoney Gel, tube 1.5 (oz) 1 x Per Day/30 Days ary Discharge Instructions: Apply to wound bed as instructed Prim Dressing: Hydrofera [name] Ready Foam, 4x5 in (Generic) 1 x Per Day/30 Days ary Discharge Instructions: Apply to wound bed as instructed Secondary Dressing: ABD Pad, 8x10 1 x Per Day/30 Days Discharge Instructions: Apply over primary dressing as directed. Secured With: [employer], 4.5x3.1 (in/yd) (Generic) 1 x Per Day/30 Days Discharge Instructions: Secure with Kerlix as directed. Secured With: 28M Medipore H Soft Cloth Surgical T ape, 4 x 10 (in/yd) (Generic) 1 x Per Day/30 Days Discharge Instructions: Secure with tape as directed. Compression Wrap: TUBIGRIP SIZE E 1 x Per Day/30 Days Discharge Instructions: apply in the morning and remove at night. ONE LAYER DO NOT DOUBLE!!! Electronic Signature(s) Signed: 06/20/2022 3:20:41 PM By: Hailey Shan DO Entered By: [name] on 06/20/2022 14:45:23 -------------------------------------------------------------------------------- Problem List Details Patient Name: Date of Service: Hailey [name], DO [name] 06/20/2022 1:45 PM Medical Record Number: [id number] Patient Account Number: [id number] Date of Birth/Sex: Treating RN: 10/11/1945 (76 y.o. F) Primary Care Provider: Lorene [name] Other Clinician: [name] ([id number]) [id number] Page 4 of 9 Referring Provider: Treating Provider/Extender: [name] in Treatment: 2 Active  Problems ICD-10 Encounter Code Description Active Date MDM Diagnosis L97.828 Non-pressure chronic ulcer of other part of left lower leg with other specified  06/03/2022 No Yes severity I87.312 Chronic venous hypertension (idiopathic) with ulcer of left lower extremity 06/03/2022 No Yes J84.9 Interstitial pulmonary disease, unspecified 06/03/2022 No Yes M06.9 Rheumatoid arthritis, unspecified 06/03/2022 No Yes Inactive Problems Resolved Problems Electronic Signature(s) Signed: 06/20/2022 3:20:41 PM By: Hailey Shan DO Entered By: [name] on 06/20/2022 14:43:29 -------------------------------------------------------------------------------- Progress Note Details Patient Name: Date of Service: Hailey [name], DO [name] 06/20/2022 1:45 PM Medical Record Number: [id number] Patient Account Number: [id number] Date of Birth/Sex: Treating RN: August 09, 1945 (76 y.o. F) Primary Care Provider: Lorene [name] Other Clinician: Referring Provider: Treating Provider/Extender: [name] in Treatment: 2 Subjective Chief Complaint Information obtained from Patient 06/03/2022; Left lower extremity wounds History of Present Illness (HPI) Patient presents today for initial evaluation and our clinic as a referral from the [facility] health system Department of orthopedics for evaluation and treatment of the wound to his his of the left foot at the base of the great toe. Currently the good news is the patient really does not have any significant pain which is excellent. She has been seen by [name] at [facility]: infectious disease clinic that is the regional Center for infectious disease. Subsequently the patient was cultured and positive for methicillin sensitive staph aureus. She has been on antibiotics including Cipro prior to surgery as will several days following surgery. She then had clindamycin which was changed to doxycycline. She took this for 10 days.  Subsequently the pain had improved and there was no longer any possible draining from the wound according to the notes. The patient sedentary rate as well as C-reactive protein have returned to normal ranges. Currently per [name] assessment there was one dehiscence with no sign of osteomyelitis. He therefore discontinue the antibiotics with the completion of the last three days Of doxycycline. Other than that he just set her for a follow-up as needed. The patient does not have diabetes and is not a current smoker. At this time her treatment that was recommended by the surgeon was applying a Hydrocolloid dressing. Based on what I'm seeing at this point the patient actually has some Slough noted over the surface of the wound there really does not appear to be evidence of infection though I do think she does require some sharp debridement to clear away the slough and help with appropriate wound healing. She does have areas of granulation buds noted. She may be a candidate for a skin substitute as well. We will see how things do over the next period of time until follow-up. No fevers, chills, nausea, or vomiting noted at this time. 06/13/18 on evaluation today patient actually appears to be doing better in regard to her toe ulcer. She's been using the Prisma on this region and that seems to have done extremely well for her. Fortunately there does not appear to be evidence of infection at this time which is good news. She did see her surgeon they were extremely pleased with the overall appearance of her wound. 06/27/18 on evaluation today patient actually appears to be doing very well in regard to her foot ulcer. She has been tolerating the dressing changes without [name], [name] ([id number]) [id number] Page 5 of 9 complication which is great news. She did see her podiatrist they felt like everything is looking very nice as well and will please. 07/05/2018 surgical wound  on the left foot. She appears to be doing well. Still surface debrided to remove however in general post debridement the wound bed  looks quite healthy it is come down significantly in terms of dimensions using silver collagen. The patient describes pain when her foot is elevated either in bed at night [sometimes keeps her awake] or when is propped up on a foot rest. She basically takes analgesics for this. She does not really describe claudication with activity however her activity is very limited by interstitial lung disease. Her ABIs initially in this clinic were noncompressible. She is not a diabetic 07/20/2018; surgical wound on the left foot. Wound actually looks quite a bit better than I remember seeing this. She is still describing pain with her leg elevated at night that she does not get when the wound is supine. She does not really describe claudication but she is very limited by her pulmonary status. We have been using silver collagen. 1/17; the patient has been followed up by orthopedics and discharge. Her arterial studies were actually quite good and should not be contributing to any pain. Slight reduction in ABIs but otherwise normal. Her wound is closed. She saw [name] of infectious disease surrounding the surgery. By review of our notes she was not felt to have osteomyelitis. 06/03/2022 Ms. Shyana Kulakowski is a 76 year old female with a past medical history of interstitial lung disease on chronic oxygen via nasal cannula, rheumatoid arthritis, chronic diastolic heart failure and OSA that presents to the clinic for a 1 to 14-monthhistory of nonhealing ulcer to the left lower extremity. On 04/20/2022 she was admitted to the hospital for left lower extremity cellulitis. She required 8 days of IV vancomycin and Unasyn followed by a 14-day course of Augmentin and doxycycline. She has been using Silvadene cream to the area. She does not use compression therapy but does own compression stockings.  She currently denies signs of infection. 11/27; patient presents for follow-up she is using Medihoney and Hydrofera [name] to the wound beds. She has been using her Tubigrip. She has no issues or complaints today. 12/4; patient presents for follow-up. She continues to use Medihoney and Hydrofera [name] to the wound beds. Several attempts have been made to schedule ABIs with TBI's. We gave patient the number today to call to have these scheduled. She has been using Tubigrip. She has no issues or complaints today. Patient History Information obtained from Patient. Family History Cancer - Maternal Grandparents, Diabetes - Siblings, Heart Disease - Maternal Grandparents,Paternal Grandparents,Mother,Father,Siblings, Hypertension - Mother,Father,Siblings, Stroke - Paternal Grandparents, No family history of Hereditary Spherocytosis, Kidney Disease, Lung Disease, Seizures, Thyroid Problems, Tuberculosis. Social History Never smoker, Marital Status - Married, Alcohol Use - Moderate, Drug Use - No History, Caffeine Use - Moderate. Medical History Eyes Denies history of Cataracts, Optic Neuritis Ear/Nose/Mouth/Throat Denies history of Chronic sinus problems/congestion Hematologic/Lymphatic Denies history of Anemia, Hemophilia, Human Immunodeficiency Virus, Lymphedema, Sickle Cell Disease Respiratory Patient has history of Asthma, Sleep Apnea Denies history of Aspiration, Chronic Obstructive Pulmonary Disease (COPD), Pneumothorax, Tuberculosis Cardiovascular Patient has history of Hypertension Denies history of Angina, Arrhythmia, Congestive Heart Failure, Coronary Artery Disease, Deep Vein Thrombosis, Hypotension, Myocardial Infarction, Peripheral Arterial Disease, Peripheral Venous Disease, Phlebitis, Vasculitis Gastrointestinal Patient has history of Colitis Denies history of Cirrhosis , Crohnoos, Hepatitis A, Hepatitis B, Hepatitis C Endocrine Denies history of Type I Diabetes, Type II  Diabetes Immunological Denies history of Lupus Erythematosus, Raynaudoos, Scleroderma Integumentary (Skin) Denies history of History of Burn Musculoskeletal Patient has history of Rheumatoid Arthritis, Osteoarthritis Denies history of Gout, Osteomyelitis Neurologic Denies history of Dementia, Neuropathy, Quadriplegia, Paraplegia, Seizure Disorder Oncologic Denies history  of Received Chemotherapy, Received Radiation Psychiatric Denies history of Anorexia/bulimia, Confinement Anxiety Hospitalization/Surgery History - surgery toe. - right hip replacement 12/2018. - heart cath 12/17/2021. Medical A Surgical History Notes nd Respiratory 4L [place] 02 pulmonary fibrosis Objective Constitutional [name], [name] ([id number]) [id number] Page 6 of 9 respirations regular, non-labored and within target range for patient.. Vitals Time Taken: 1:49 PM, Height: 61 in, Weight: 186 lbs, BMI: 35.1, Temperature: 98.5 F, Pulse: 103 bpm, Respiratory Rate: 20 breaths/min, Blood Pressure: 145/79 mmHg. Cardiovascular 2+ dorsalis pedis/posterior tibialis pulses. Psychiatric pleasant and cooperative. General Notes: T the lateral aspect of the left lower extremity there are 2 open wounds with nonviable tissue and granulation tissue. 2+ pitting edema to the o knee. Obvious deformity to the foot from previous surgeries. No signs of surrounding soft tissue infection. Integumentary (Hair, Skin) Wound #4 status is Open. Original cause of wound was Gradually Appeared. The date acquired was: 04/19/2022. The wound has been in treatment 2 weeks. The wound is located on the Left,Proximal,Anterior Lower Leg. The wound measures 2.5cm length x 1.6cm width x 0.3cm depth; 3.142cm^2 area and 0.942cm^3 volume. There is Fat Layer (Subcutaneous Tissue) exposed. There is no tunneling or undermining noted. There is a medium amount of serosanguineous drainage noted. The wound margin is distinct with the  outline attached to the wound base. There is medium (34-66%) red, pink granulation within the wound bed. There is a medium (34-66%) amount of necrotic tissue within the wound bed including Adherent Slough. The periwound skin appearance did not exhibit: Callus, Crepitus, Excoriation, Induration, Rash, Scarring, Dry/Scaly, Maceration, Atrophie Blanche, Cyanosis, Ecchymosis, Hemosiderin Staining, Mottled, Pallor, Rubor, Erythema. Periwound temperature was noted as No Abnormality. The periwound has tenderness on palpation. Wound #5 status is Open. Original cause of wound was Gradually Appeared. The date acquired was: 04/19/2022. The wound has been in treatment 2 weeks. The wound is located on the [facility] Lower Leg. The wound measures 5.5cm length x 3.6cm width x 0.3cm depth; 15.551cm^2 area and 4.665cm^3 volume. There is Fat Layer (Subcutaneous Tissue) exposed. There is no tunneling or undermining noted. There is a medium amount of serosanguineous drainage noted. The wound margin is distinct with the outline attached to the wound base. There is medium (34-66%) red, pink granulation within the wound bed. There is a medium (34-66%) amount of necrotic tissue within the wound bed including Adherent Slough. The periwound skin appearance did not exhibit: Callus, Crepitus, Excoriation, Induration, Rash, Scarring, Dry/Scaly, Maceration, Atrophie Blanche, Cyanosis, Ecchymosis, Hemosiderin Staining, Mottled, Pallor, Rubor, Erythema. Periwound temperature was noted as No Abnormality. The periwound has tenderness on palpation. 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 We gave her  information to call and schedule her ABIs with TBI's. For now I recommended continuing Hydrofera [name] and Medihoney under Tubigrip. We discussed potentially doing a compression wrap if she has adequate blood flow for wound healing. She was open to the idea. She has declined compression wraps in the past. Plan Follow-up Appointments: Return Appointment in 1 week. - Dr. Heber [place] Return Appointment in 2 weeks. - Dr. Heber [place] Other: - Call Dr. Kennon Holter office to schedule appt. for ABI's/TBI's Anesthetic: (In clinic) Topical Lidocaine 5% applied to wound bed Bathing/ Shower/ Hygiene: May shower with protection but do not get wound dressing(s)  wet. Edema Control - Lymphedema / SCD / Other: Elevate legs to the level of the heart or above for 30 minutes daily and/or when sitting, a frequency of: - 3-4 times a day throughout the day. Avoid standing for long periods of time. WOUND #4: - Lower Leg Wound Laterality: Left, Anterior, Proximal Cleanser: Wound Cleanser (Generic) 1 x Per Day/30 Days Discharge Instructions: Cleanse the wound with wound cleanser prior to applying a clean dressing using gauze sponges, not tissue or cotton balls. Prim Dressing: MediHoney Gel, tube 1.5 (oz) 1 x Per Day/30 Days ary Discharge Instructions: Apply to wound bed as instructed Prim Dressing: Hydrofera [name] Ready Foam, 4x5 in (Generic) 1 x Per Day/30 Days ary Discharge Instructions: Apply to wound bed as instructed Secondary Dressing: ABD Pad, 8x10 1 x Per Day/30 Days Discharge Instructions: Apply over primary dressing as directed. Secured With: [employer], 4.5x3.1 (in/yd) (Generic) 1 x Per Day/30 Days Discharge Instructions: Secure with Kerlix as directed. Secured With: 449M Medipore H Soft Cloth Surgical T ape, 4 x 10 (in/yd) (Generic) 1 x Per Day/30 Days Discharge Instructions: Secure with tape as directed. Com pression Wrap: TUBIGRIP SIZE E 1 x Per Day/30 Days Discharge Instructions: apply in the morning  and remove at night. ONE LAYER DO NOT DOUBLE!!! WOUND #5: - Lower Leg Wound Laterality: Left, Anterior, Distal Cleanser: Wound Cleanser (Generic) 1 x Per Day/30 Days Discharge Instructions: Cleanse the wound with wound cleanser prior to applying a clean dressing using gauze sponges, not tissue or cotton balls. Prim Dressing: MediHoney Gel, tube 1.5 (oz) 1 x Per Day/30 Days ary Discharge Instructions: Apply to wound bed as instructed Prim Dressing: Hydrofera [name] Ready Foam, 4x5 in (Generic) 1 x Per Day/30 Days [name], [name] ([id number]) [id number] Page 7 of 9 Discharge Instructions: Apply to wound bed as instructed Secondary Dressing: ABD Pad, 8x10 1 x Per Day/30 Days Discharge Instructions: Apply over primary dressing as directed. Secured With: [employer], 4.5x3.1 (in/yd) (Generic) 1 x Per Day/30 Days Discharge Instructions: Secure with Kerlix as directed. Secured With: 449M Medipore H Soft Cloth Surgical T ape, 4 x 10 (in/yd) (Generic) 1 x Per Day/30 Days Discharge Instructions: Secure with tape as directed. Compression Wrap: TUBIGRIP SIZE E 1 x Per Day/30 Days Discharge Instructions: apply in the morning and remove at night. ONE LAYER DO NOT DOUBLE!!! 1. Medihoney with Hydrofera [name] under Tubigrip 2. Follow-up in 1 week 3. Follow-up ABIs with TBI's Electronic Signature(s) Signed: 06/20/2022 3:20:41 PM By: Hailey Shan DO Entered By: [name] on 06/20/2022 14:47:57 -------------------------------------------------------------------------------- HxROS Details Patient Name: Date of Service: Hailey Courser, DO [name] 06/20/2022 1:45 PM Medical Record Number: [id number] Patient Account Number: [id number] Date of Birth/Sex: Treating RN: [birth year] (76 y.o. F) Primary Care Provider: Lorene [name] Other Clinician: Referring Provider: Treating Provider/Extender: [name] in Treatment: 2 Information Obtained  From Patient Eyes Medical History: Negative for: Cataracts; Optic Neuritis Ear/Nose/Mouth/Throat Medical History: Negative for: Chronic sinus problems/congestion Hematologic/Lymphatic Medical History: Negative for: Anemia; Hemophilia; Human Immunodeficiency Virus; Lymphedema; Sickle Cell Disease Respiratory Medical History: Positive for: Asthma; Sleep Apnea Negative for: Aspiration; Chronic Obstructive Pulmonary Disease (COPD); Pneumothorax; Tuberculosis Past Medical History Notes: 4L [place] 02 pulmonary fibrosis Cardiovascular Medical History: Positive for: Hypertension Negative for: Angina; Arrhythmia; Congestive Heart Failure; Coronary Artery Disease; Deep Vein Thrombosis; Hypotension; Myocardial Infarction; Peripheral Arterial Disease; Peripheral Venous Disease; Phlebitis; Vasculitis Gastrointestinal Medical History: Positive for: Colitis Negative for: Cirrhosis ; Crohns; Hepatitis A; Hepatitis B;  [name], [name] ([id number]) [id number] Page 8 of 9 Endocrine Medical History: Negative for: Type I Diabetes; Type II Diabetes Immunological Medical History: Negative for: Lupus Erythematosus; Raynauds; Scleroderma Integumentary (Skin) Medical History: Negative for: History of Burn Musculoskeletal Medical History: Positive for: Rheumatoid Arthritis; Osteoarthritis Negative for: Gout; Osteomyelitis Neurologic Medical History: Negative for: Dementia; Neuropathy; Quadriplegia; Paraplegia; Seizure Disorder Oncologic Medical History: Negative for: Received Chemotherapy; Received Radiation Psychiatric Medical History: Negative for: Anorexia/bulimia; Confinement Anxiety Immunizations Pneumococcal Vaccine: Received Pneumococcal Vaccination: Yes Received Pneumococcal Vaccination On or After 60th Birthday: Yes Implantable Devices No devices added Hospitalization / Surgery History Type of Hospitalization/Surgery surgery toe right hip  replacement 12/2018 heart cath 12/17/2021 Family and Social History Cancer: Yes - Maternal Grandparents; Diabetes: Yes - Siblings; Heart Disease: Yes - Maternal Grandparents,Paternal Grandparents,Mother,Father,Siblings; Hereditary Spherocytosis: No; Hypertension: Yes - Mother,Father,Siblings; Kidney Disease: No; Lung Disease: No; Seizures: No; Stroke: Yes - Paternal Grandparents; Thyroid Problems: No; Tuberculosis: No; Never smoker; Marital Status - Married; Alcohol Use: Moderate; Drug Use: No History; Caffeine Use: Moderate; Financial Concerns: No; Food, Clothing or Shelter Needs: No; Support System Lacking: No; Transportation Concerns: No Electronic Signature(s) Signed: 06/20/2022 3:20:41 PM By: Hailey Shan DO Entered By: [name] on 06/20/2022 14:44:37 -------------------------------------------------------------------------------- SuperBill Details Patient Name: Date of Service: Hailey [name], DO [place]. 06/20/2022 Medical Record Number: [id number] Patient Account Number: [id number] Date of Birth/Sex: Treating RN: 10-25-1945 (76 y.o. 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 J84.9 Interstitial pulmonary disease, unspecified M06.9 Rheumatoid arthritis, unspecified Facility Procedures : CPT4 Code: [id number] Description: 92426 - WOUND CARE VISIT-LEV 4 EST PT Modifier: Quantity: 1 Physician Procedures : CPT4 Code Description Modifier [id number] - WC PHYS LEVEL 3 - EST PT ICD-10 Diagnosis Description L97.828 Non-pressure chronic ulcer of  other part of left lower leg with other specified severity I87.312 Chronic venous hypertension (idiopathic) with  ulcer of left lower extremity J84.9 Interstitial pulmonary disease, unspecified M06.9 Rheumatoid arthritis, unspecified Quantity: 1 Electronic Signature(s) Signed: 06/20/2022 3:20:41 PM By: Hailey Shan DO Entered By: [name] on 06/20/2022 14:48:10

## 2022-06-21 NOTE — Telephone Encounter (Signed)
ATC patient regarding Tyvaso. Unable to reach and VM box is full so unable to leave VM  Knox Saliva, PharmD, MPH, BCPS, CPP Clinical Pharmacist (Rheumatology and Pulmonology)

## 2022-06-22 ENCOUNTER — Other Ambulatory Visit (HOSPITAL_COMMUNITY): Payer: Self-pay | Admitting: Internal Medicine

## 2022-06-22 DIAGNOSIS — L97212 Non-pressure chronic ulcer of right calf with fat layer exposed: Secondary | ICD-10-CM

## 2022-06-22 DIAGNOSIS — I739 Peripheral vascular disease, unspecified: Secondary | ICD-10-CM

## 2022-06-23 NOTE — Progress Notes (Signed)
[name], Hailey [name] ([id number]) [id number] Page 1 of 11 Visit Report for 06/20/2022 Arrival Information Details Patient Name: Date of Service: Hailey [name]. 06/20/2022 1:45 PM Medical Record Number: [id number] Patient Account Number: [id number] Date of Birth/Sex: Treating RN: 10/12/1945 (76 y.o. F) Primary Care Hailey [name]: Roberts, Ronald Other Clinician: Referring Mariana Goytia: Treating Hailey [name]/Extender: Hailey [name] Roberts, Ronald Weeks in Treatment: 2 Visit Information History Since Last Visit All ordered tests and consults were completed: No Patient Arrived: Wheel Chair Added or deleted any medications: No Arrival Time: 13:47 Any new allergies or adverse reactions: No Accompanied By: husband Had a fall or experienced change in No Transfer Assistance: None activities of daily living that may affect Patient Identification Verified: Yes risk of falls: Secondary Verification Process Completed: Yes Signs or symptoms of abuse/neglect since last visito No Patient Requires Transmission-Based Precautions: No Hospitalized since last visit: No Patient Has Alerts: Yes Implantable device outside of the clinic excluding No Patient Alerts: ABI'[name]: L: N/C 11/23 cellular tissue based products placed in the center since last visit: Has Compression in Place as Prescribed: Yes Pain Present Now: No Notes patient did not schedule or go for her arterial studies. Phone number provided. Electronic Signature([name]) Signed: 06/20/2022 4:59:17 PM By: Deaton, Bobbi RN, BSN Entered By: Deaton, Bobbi on 06/20/2022 14:00:24 -------------------------------------------------------------------------------- Clinic Level of Care Assessment Details Patient Name: Date of Service: Hailey [name]. 06/20/2022 1:45 PM Medical Record Number: [id number] Patient Account Number: [id number] Date of Birth/Sex: Treating RN: 06/16/1946 (76 y.o. F) [name], Hailey Primary Care Hailey [name]:  Roberts, Ronald Other Clinician: Referring Hailey [name]: Treating Hailey [name]/Extender: Hailey [name] Roberts, Ronald Weeks in Treatment: 2 Clinic Level of Care Assessment Items TOOL 4 Quantity Score X- 1 0 Use when only an EandM is performed on FOLLOW-UP visit ASSESSMENTS - Nursing Assessment / Reassessment X- 1 10 Reassessment of Hailey-morbidities (includes updates in patient status) X- 1 5 Reassessment of Adherence to Treatment Plan ASSESSMENTS - Wound and Skin A ssessment / Reassessment [] - 0 Simple Wound Assessment / Reassessment - one wound X- 2 5 Complex Wound Assessment / Reassessment - multiple wounds [] - 0 Dermatologic / Skin Assessment (not related to wound area) ASSESSMENTS - Focused Assessment Hailey [name], Hailey [name] ([id number]) [id number] Page 2 of 11 X- 1 5 Circumferential Edema Measurements - multi extremities [] - 0 Nutritional Assessment / Counseling / Intervention [] - 0 Lower Extremity Assessment (monofilament, tuning fork, pulses) [] - 0 Peripheral Arterial Disease Assessment (using hand held doppler) ASSESSMENTS - Ostomy and/or Continence Assessment and Care [] - 0 Incontinence Assessment and Management [] - 0 Ostomy Care Assessment and Management (repouching, etc.) PROCESS - Coordination of Care [] - 0 Simple Patient / Family Education for ongoing care X- 1 20 Complex (extensive) Patient / Family Education for ongoing care X- 1 10 Staff obtains Consents, Records, T Results / Process Orders est [] - 0 Staff telephones HHA, Nursing Homes / Clarify orders / etc [] - 0 Routine Transfer to another Facility (non-emergent condition) [] - 0 Routine Hospital Admission (non-emergent condition) [] - 0 New Admissions / Insurance Authorizations / Ordering NPWT Apligraf, etc. , [] - 0 Emergency Hospital Admission (emergent condition) [] - 0 Simple Discharge Coordination X- 1 15 Complex (extensive) Discharge Coordination PROCESS -  Special Needs [] - 0 Pediatric / Minor Patient Management [] - 0 Isolation Patient Management [] - 0 Hearing / Language / Visual special needs [] - 0 Assessment of Community assistance (transportation, D/C   planning, etc.) [] - 0 Additional assistance / Altered mentation [] - 0 Support Surface([name]) Assessment (bed, cushion, seat, etc.) INTERVENTIONS - Wound Cleansing / Measurement [] - 0 Simple Wound Cleansing - one wound X- 2 5 Complex Wound Cleansing - multiple wounds X- 1 5 Wound Imaging (photographs - any number of wounds) [] - 0 Wound Tracing (instead of photographs) [] - 0 Simple Wound Measurement - one wound X- 2 5 Complex Wound Measurement - multiple wounds INTERVENTIONS - Wound Dressings [] - 0 Small Wound Dressing one or multiple wounds X- 2 15 Medium Wound Dressing one or multiple wounds [] - 0 Large Wound Dressing one or multiple wounds X- 1 5 Application of Medications - topical [] - 0 Application of Medications - injection INTERVENTIONS - Miscellaneous [] - 0 External ear exam [] - 0 Specimen Collection (cultures, biopsies, blood, body fluids, etc.) [] - 0 Specimen([name]) / Culture([name]) sent or taken to Lab for analysis [] - 0 Patient Transfer (multiple staff / Hailey [name] / Similar devices) [] - 0 Simple Staple / Suture removal (25 or less) Hailey [name] ([id number]) [id number] Page 3 of 11 [] - 0 Complex Staple / Suture removal (26 or more) [] - 0 Hypo / Hyperglycemic Management (close monitor of Blood Glucose) [] - 0 Ankle / Brachial Index (ABI) - do not check if billed separately X- 1 5 Vital Signs Has the patient been seen at the hospital within the last three years: Yes Total Score: 140 Level Of Care: New/Established - Level 4 Electronic Signature([name]) Signed: 06/20/2022 3:46:46 PM By: Hailey Hammock RN Entered By: Hailey [name] on 06/20/2022  14:22:51 -------------------------------------------------------------------------------- Encounter Discharge Information Details Patient Name: Date of Service: Hailey Hailey App, DO RO THY [name]. 06/20/2022 1:45 PM Medical Record Number: [id number] Patient Account Number: [id number] Date of Birth/Sex: Treating RN: 12/23/1945 (76 y.o. [name], Hailey Primary Care Jeramia Saleeby: Lorene Dy Other Clinician: Referring Damire Remedios: Treating Thong Feeny/Extender: [name] in Treatment: 2 Encounter Discharge Information Items Discharge Condition: Stable Ambulatory Status: Wheelchair Discharge Destination: Home Transportation: Private Auto Accompanied By: husband Schedule Follow-up Appointment: Yes Clinical Summary of Care: Patient Declined Electronic Signature([name]) Signed: 06/20/2022 3:46:46 PM By: Hailey Hammock RN Entered By: Hailey [name] on 06/20/2022 14:23:23 -------------------------------------------------------------------------------- Lower Extremity Assessment Details Patient Name: Date of Service: [name], DO RO THY [name]. 06/20/2022 1:45 PM Medical Record Number: [id number] Patient Account Number: [id number] Date of Birth/Sex: Treating RN: [birth year] (76 y.o. [name], [name] Primary Care Mattia Liford: Lorene Dy Other Clinician: Referring Jashan Cotten: Treating Pearce Littlefield/Extender: [name] in Treatment: 2 Edema Assessment Assessed: [name]: Yes] [Right: No] Edema: [Left: Ye] [Right: [name]] Calf Left: Right: Point of Measurement: 29 cm From Medial Instep 31.5 cm Ankle Left: Right: Point of Measurement: 9 cm From Medial Instep 25 cm SHALANDRIA, ELSBERND ([id number]) [id number] Page 4 of 11 Vascular Assessment Pulses: Dorsalis Pedis Palpable: [Left:Yes] Electronic Signature([name]) Signed: 06/20/2022 4:59:17 PM By: Deon Pilling RN, BSN Entered By: Deon Pilling on 06/20/2022  13:58:13 -------------------------------------------------------------------------------- Multi Wound Chart Details Patient Name: Date of Service: [name], DO RO THY [name]. 06/20/2022 1:45 PM Medical Record Number: [id number] Patient Account Number: [id number] Date of Birth/Sex: Treating RN: [birth year] (76 y.o. F) Primary Care Jakai Risse: Lorene Dy Other Clinician: Referring Branndon Tuite: Treating Wiktoria Hemrick/Extender: [name] in Treatment: 2 Vital Signs Height(in): 61 Pulse(bpm): 103 Weight(lbs): 186 Blood Pressure(mmHg): 145/79 Body Mass Index(BMI): 35.1 Temperature(F): 98.5 Respiratory Rate(breaths/min): 20 [4:Photos:] [N/A:N/A] Left, Proximal, Anterior Lower Leg  Left, Distal, Anterior Lower Leg N/A Wound Location: Gradually Appeared Gradually Appeared N/A Wounding Event: Cellulitis Cellulitis N/A Primary Etiology: Asthma, Sleep Apnea, Hypertension, Asthma, Sleep Apnea, Hypertension, N/A Comorbid History: Colitis, Rheumatoid Arthritis, Colitis, Rheumatoid Arthritis, Osteoarthritis Osteoarthritis 04/19/2022 04/19/2022 N/A Date Acquired: 2 2 N/A Weeks of Treatment: Open Open N/A Wound Status: No No N/A Wound Recurrence: 2.5x1.6x0.3 5.5x3.6x0.3 N/A Measurements L x W x D (cm) 3.142 15.551 N/A A (cm) : rea 0.942 4.665 N/A Volume (cm) : 25.90% 0.00% N/A % Reduction in Area: 25.90% -200.00% N/A % Reduction in Volume: Full Thickness With Exposed Support Full Thickness With Exposed Support N/A Classification: Structures Structures Medium Medium N/A Exudate Amount: Serosanguineous Serosanguineous N/A Exudate Type: red, brown red, brown N/A Exudate Color: Distinct, outline attached Distinct, outline attached N/A Wound Margin: Medium (34-66%) Medium (34-66%) N/A Granulation Amount: Red, Pink Red, Pink N/A Granulation Quality: Medium (34-66%) Medium (34-66%) N/A Necrotic Amount: Fat Layer (Subcutaneous Tissue): Yes Fat Layer  (Subcutaneous Tissue): Yes N/A Exposed Structures: Fascia: No Fascia: No Tendon: No Tendon: No Muscle: No Muscle: No Joint: No Joint: No Bone: No Bone: No Small (1-33%) Small (1-33%) N/A EpithelializationONYA, Hailey [name] ([id number]) [id number] Page 5 of 11 Excoriation: No Excoriation: No N/A Periwound Skin Texture: Induration: No Induration: No Callus: No Callus: No Crepitus: No Crepitus: No Rash: No Rash: No Scarring: No Scarring: No Maceration: No Maceration: No N/A Periwound Skin Moisture: Dry/Scaly: No Dry/Scaly: No Atrophie Blanche: No Atrophie Blanche: No N/A Periwound Skin Color: Cyanosis: No Cyanosis: No Ecchymosis: No Ecchymosis: No Erythema: No Erythema: No Hemosiderin Staining: No Hemosiderin Staining: No Mottled: No Mottled: No Pallor: No Pallor: No Rubor: No Rubor: No No Abnormality No Abnormality N/A Temperature: Yes Yes N/A Tenderness on Palpation: Treatment Notes Wound #4 (Lower Leg) Wound Laterality: Left, Anterior, Proximal Cleanser Wound Cleanser Discharge Instruction: Cleanse the wound with wound cleanser prior to applying a clean dressing using gauze sponges, not tissue or cotton balls. Peri-Wound Care Topical Primary Dressing MediHoney Gel, tube 1.5 (oz) Discharge Instruction: Apply to wound bed as instructed Hydrofera Blue Ready Foam, 4x5 in Discharge Instruction: Apply to wound bed as instructed Secondary Dressing ABD Pad, 8x10 Discharge Instruction: Apply over primary dressing as directed. Secured With [employer], 4.5x3.1 (in/yd) Discharge Instruction: Secure with Kerlix as directed. 80M Medipore H Soft Cloth Surgical T ape, 4 x 10 (in/yd) Discharge Instruction: Secure with tape as directed. Compression Wrap TUBIGRIP SIZE E Discharge Instruction: apply in the morning and remove at night. ONE LAYER DO NOT DOUBLE!!! Compression Stockings Add-Ons Wound #5 (Lower Leg) Wound  Laterality: Left, Anterior, Distal Cleanser Wound Cleanser Discharge Instruction: Cleanse the wound with wound cleanser prior to applying a clean dressing using gauze sponges, not tissue or cotton balls. Peri-Wound Care Topical Primary Dressing MediHoney Gel, tube 1.5 (oz) Discharge Instruction: Apply to wound bed as instructed Hydrofera Blue Ready Foam, 4x5 in Discharge Instruction: Apply to wound bed as instructed Secondary Dressing ABD Pad, 8x10 Discharge Instruction: Apply over primary dressing as directed. Secured With [employer], 4.5x3.1 (in/yd) Discharge Instruction: Secure with Kerlix as directed. Hailey, [name] ([id number]) [id number] Page 6 of 11 80M Medipore H Soft Cloth Surgical T ape, 4 x 10 (in/yd) Discharge Instruction: Secure with tape as directed. Compression Wrap TUBIGRIP SIZE E Discharge Instruction: apply in the morning and remove at night. ONE LAYER DO NOT DOUBLE!!! Compression Stockings Add-Ons Electronic Signature([name]) Signed: 06/20/2022 3:20:41 PM By: Kalman Shan DO Entered By: Kalman Shan on 06/20/2022 14:43:35 -------------------------------------------------------------------------------- Multi-Disciplinary  Care Plan Details Patient Name: Date of Service: Hailey [name]. 06/20/2022 1:45 PM Medical Record Number: [id number] Patient Account Number: [id number] Date of Birth/Sex: Treating RN: 08/04/1945 (76 y.o. F) [name], Hailey Primary Care : Roberts, Ronald Other Clinician: Referring : Treating /Extender: Hailey [name] Roberts, Ronald Weeks in Treatment: 2 Active Inactive Pain, Acute or Chronic Nursing Diagnoses: Pain, acute or chronic: actual or potential Potential alteration in comfort, pain Goals: Patient will verbalize adequate pain control and receive pain control interventions during procedures as needed Date Initiated: 06/03/2022 Target Resolution Date:  06/24/2022 Goal Status: Active Patient/caregiver will verbalize comfort level met Date Initiated: 06/03/2022 Target Resolution Date: 06/23/2022 Goal Status: Active Interventions: Encourage patient to take pain medications as prescribed Provide education on pain management Treatment Activities: Administer pain control measures as ordered : 06/03/2022 Notes: Venous Leg Ulcer Nursing Diagnoses: Potential for venous Insuffiency (use before diagnosis confirmed) Goals: Patient will maintain optimal edema control Date Initiated: 06/03/2022 Target Resolution Date: 06/24/2022 Goal Status: Active Interventions: Assess peripheral edema status every visit. Compression as ordered Provide education on venous insufficiency Hailey [name], Hailey [name] ([id number]) [id number] Page 7 of 11 Treatment Activities: Therapeutic compression applied : 06/03/2022 Notes: Electronic Signature([name]) Signed: 06/20/2022 3:46:46 PM By: [name], Lauren RN Entered By: [name], Hailey on 06/20/2022 14:19:47 -------------------------------------------------------------------------------- Pain Assessment Details Patient Name: Date of Service: Hailey [name]. 06/20/2022 1:45 PM Medical Record Number: [id number] Patient Account Number: [id number] Date of Birth/Sex: Treating RN: 01/16/1946 (76 y.o. F) Primary Care : Roberts, Ronald Other Clinician: Referring : Treating /Extender: Hailey [name] Roberts, Ronald Weeks in Treatment: 2 Active Problems Location of Pain Severity and Description of Pain Patient Has Paino No Site Locations Pain Management and Medication Current Pain Management: Electronic Signature([name]) Signed: 06/22/2022 4:44:51 PM By: Dick, Kimberly Entered By: Dick, Kimberly on 06/20/2022 13:50:20 -------------------------------------------------------------------------------- Patient/Caregiver Education Details Patient Name: Date of Service: Hailey O K, DO  RO THY [name]. 12/4/2023andnbsp1:45 PM Medical Record Number: [id number] Patient Account Number: [id number] Date of Birth/Gender: Treating RN: 05/13/1946 (76 y.o. F) [name], Hailey Primary Care Physician: Roberts, Ronald Other Clinician: Referring Physician: Treating Physician/Extender: Hailey [name] Roberts, Ronald Weeks in Treatment: 2 Kerstetter, Joani [name] ([id number]) [id number] Page 8 of 11 Education Assessment Education Provided To: Patient Education Topics Provided Pain: Methods: Explain/Verbal Responses: Reinforcements needed, State content correctly Electronic Signature([name]) Signed: 06/20/2022 3:46:46 PM By: [name], Lauren RN Entered By: [name], Hailey on 06/20/2022 14:20:02 -------------------------------------------------------------------------------- Wound Assessment Details Patient Name: Date of Service: Hailey [name]. 06/20/2022 1:45 PM Medical Record Number: [id number] Patient Account Number: [id number] Date of Birth/Sex: Treating RN: 02/20/1946 (76 y.o. F) Deaton, Bobbi Primary Care : Roberts, Ronald Other Clinician: Referring : Treating /Extender: Hailey [name] Roberts, Ronald Weeks in Treatment: 2 Wound Status Wound Number: 4 Primary Cellulitis Etiology: Wound Location: Left, Proximal, Anterior Lower Leg Wound Open Wounding Event: Gradually Appeared Status: Date Acquired: 04/19/2022 Comorbid Asthma, Sleep Apnea, Hypertension, Colitis, Rheumatoid Weeks Of Treatment: 2 History: Arthritis, Osteoarthritis Clustered Wound: No Photos Wound Measurements Length: (cm) 2.5 Width: (cm) 1.6 Depth: (cm) 0.3 Area: (cm) 3.142 Volume: (cm) 0.942 % Reduction in Area: 25.9% % Reduction in Volume: 25.9% Epithelialization: Small (1-33%) Tunneling: No Undermining: No Wound Description Classification: Full Thickness With Exposed Suppor Wound Margin: Distinct, outline attached Exudate Amount: Medium Exudate  Type: Serosanguineous Exudate Color: red, brown t Structures Foul Odor After Cleansing: No Slough/Fibrino Yes Wound Bed Granulation Amount: Medium (34-66%) Exposed Structure Granulation Quality: Red, Pink Fascia Exposed: No   Hailey [name], Hailey [name] ([id number]) [id number] Page 9 of 11 Necrotic Amount: Medium (34-66%) Fat Layer (Subcutaneous Tissue) Exposed: Yes Necrotic Quality: Adherent Slough Tendon Exposed: No Muscle Exposed: No Joint Exposed: No Bone Exposed: No Periwound Skin Texture Texture Color No Abnormalities Noted: No No Abnormalities Noted: No Callus: No Atrophie Blanche: No Crepitus: No Cyanosis: No Excoriation: No Ecchymosis: No Induration: No Erythema: No Rash: No Hemosiderin Staining: No Scarring: No Mottled: No Pallor: No Moisture Rubor: No No Abnormalities Noted: No Dry / Scaly: No Temperature / Pain Maceration: No Temperature: No Abnormality Tenderness on Palpation: Yes Treatment Notes Wound #4 (Lower Leg) Wound Laterality: Left, Anterior, Proximal Cleanser Wound Cleanser Discharge Instruction: Cleanse the wound with wound cleanser prior to applying a clean dressing using gauze sponges, not tissue or cotton balls. Peri-Wound Care Topical Primary Dressing MediHoney Gel, tube 1.5 (oz) Discharge Instruction: Apply to wound bed as instructed Hydrofera Blue Ready Foam, 4x5 in Discharge Instruction: Apply to wound bed as instructed Secondary Dressing ABD Pad, 8x10 Discharge Instruction: Apply over primary dressing as directed. Secured With Kerlix Roll Sterile, 4.5x3.1 (in/yd) Discharge Instruction: Secure with Kerlix as directed. 3M Medipore H Soft Cloth Surgical T ape, 4 x 10 (in/yd) Discharge Instruction: Secure with tape as directed. Compression Wrap TUBIGRIP SIZE E Discharge Instruction: apply in the morning and remove at night. ONE LAYER DO NOT DOUBLE!!! Compression Stockings Add-Ons Electronic Signature([name]) Signed:  06/20/2022 4:59:17 PM By: Deaton, Bobbi RN, BSN Entered By: Deaton, Bobbi on 06/20/2022 13:59:23 -------------------------------------------------------------------------------- Wound Assessment Details Patient Name: Date of Service: Hailey [name]. 06/20/2022 1:45 PM Medical Record Number: [id number] Patient Account Number: [id number] Date of Birth/Sex: Treating RN: 11/06/1945 (76 y.o. F) Deaton, Bobbi Primary Care : Roberts, Ronald Other Clinician: [name], Hailey [name] ([id number]) [id number] Page 10 of 11 Referring : Treating /Extender: Hailey [name] Roberts, Ronald Weeks in Treatment: 2 Wound Status Wound Number: 5 Primary Cellulitis Etiology: Wound Location: Left, Distal, Anterior Lower Leg Wound Open Wounding Event: Gradually Appeared Status: Date Acquired: 04/19/2022 Comorbid Asthma, Sleep Apnea, Hypertension, Colitis, Rheumatoid Weeks Of Treatment: 2 History: Arthritis, Osteoarthritis Clustered Wound: No Photos Wound Measurements Length: (cm) 5.5 Width: (cm) 3.6 Depth: (cm) 0.3 Area: (cm) 15.551 Volume: (cm) 4.665 % Reduction in Area: 0% % Reduction in Volume: -200% Epithelialization: Small (1-33%) Tunneling: No Undermining: No Wound Description Classification: Full Thickness With Exposed Support Structures Wound Margin: Distinct, outline attached Exudate Amount: Medium Exudate Type: Serosanguineous Exudate Color: red, brown Foul Odor After Cleansing: No Slough/Fibrino Yes Wound Bed Granulation Amount: Medium (34-66%) Exposed Structure Granulation Quality: Red, Pink Fascia Exposed: No Necrotic Amount: Medium (34-66%) Fat Layer (Subcutaneous Tissue) Exposed: Yes Necrotic Quality: Adherent Slough Tendon Exposed: No Muscle Exposed: No Joint Exposed: No Bone Exposed: No Periwound Skin Texture Texture Color No Abnormalities Noted: No No Abnormalities Noted: No Callus: No Atrophie Blanche: No Crepitus:  No Cyanosis: No Excoriation: No Ecchymosis: No Induration: No Erythema: No Rash: No Hemosiderin Staining: No Scarring: No Mottled: No Pallor: No Moisture Rubor: No No Abnormalities Noted: No Dry / Scaly: No Temperature / Pain Maceration: No Temperature: No Abnormality Tenderness on Palpation: Yes Treatment Notes Wound #5 (Lower Leg) Wound Laterality: Left, Anterior, Distal Cleanser Wound Cleanser Discharge Instruction: Cleanse the wound with wound cleanser prior to applying a clean dressing using gauze sponges, not tissue or cotton balls. Peri-Wound Care [name], Hailey [name] ([id number]) [id number] Page 11 of 11 Topical Primary Dressing MediHoney Gel, tube 1.5 (oz) Discharge Instruction: Apply to wound bed as instructed Hydrofera Blue   Ready Foam, 4x5 in Discharge Instruction: Apply to wound bed as instructed Secondary Dressing ABD Pad, 8x10 Discharge Instruction: Apply over primary dressing as directed. Secured With [employer], 4.5x3.1 (in/yd) Discharge Instruction: Secure with Kerlix as directed. 19M Medipore H Soft Cloth Surgical T ape, 4 x 10 (in/yd) Discharge Instruction: Secure with tape as directed. Compression Wrap TUBIGRIP SIZE E Discharge Instruction: apply in the morning and remove at night. ONE LAYER DO NOT DOUBLE!!! Compression Stockings Add-Ons Electronic Signature([name]) Signed: 06/20/2022 4:59:17 PM By: Deon Pilling RN, BSN Entered By: Deon Pilling on 06/20/2022 13:59:40 -------------------------------------------------------------------------------- Vitals Details Patient Name: Date of Service: Hailey Hailey App, DO RO THY [name]. 06/20/2022 1:45 PM Medical Record Number: [id number] Patient Account Number: [id number] Date of Birth/Sex: Treating RN: 1946-02-10 (76 y.o. F) Primary Care Nasire Reali: Lorene Dy Other Clinician: Referring Burnham Trost: Treating Arleny Kruger/Extender: [name] in Treatment: 2 Vital  Signs Time Taken: 13:49 Temperature (F): 98.5 Height (in): 61 Pulse (bpm): 103 Weight (lbs): 186 Respiratory Rate (breaths/min): 20 Body Mass Index (BMI): 35.1 Blood Pressure (mmHg): 145/79 Reference Range: 80 - 120 mg / dl Electronic Signature([name]) Signed: 06/22/2022 4:44:51 PM By: Erenest Blank Entered By: Erenest Blank on 06/20/2022 13:50:13

## 2022-06-23 NOTE — Telephone Encounter (Signed)
Spoke with patient regarding fax that we received. Advised her that Tyvaso efficacy is dose-related and ideally she would take four times daily as prescribed.  She states that sometimes she wakes up early and timing of Tyvaso doses does not work out so she may miss her last dose of Tyvaso at night.  She states she will try to take as prescribed moving forward. Spoke with Corene Cornea, [facility], at [place] and advised that patient has been educated and would like to receive rx as is. He has set up shipment to go out today. Pt aware.  Knox Saliva, PharmD, MPH, BCPS, CPP Clinical Pharmacist (Rheumatology and Pulmonology)

## 2022-06-27 ENCOUNTER — Ambulatory Visit (HOSPITAL_BASED_OUTPATIENT_CLINIC_OR_DEPARTMENT_OTHER): Payer: Medicare Other | Admitting: Internal Medicine

## 2022-06-29 ENCOUNTER — Ambulatory Visit (HOSPITAL_BASED_OUTPATIENT_CLINIC_OR_DEPARTMENT_OTHER): Payer: Medicare Other | Admitting: Internal Medicine

## 2022-06-29 ENCOUNTER — Ambulatory Visit (HOSPITAL_COMMUNITY)
Admission: RE | Admit: 2022-06-29 | Discharge: 2022-06-29 | Disposition: A | Discharge status: Home / Self Care | Payer: Medicare Other | Source: Ambulatory Visit | Attending: Cardiovascular Disease | Admitting: Cardiovascular Disease

## 2022-06-29 DIAGNOSIS — L97212 Non-pressure chronic ulcer of right calf with fat layer exposed: Secondary | ICD-10-CM | POA: Diagnosis present

## 2022-06-29 DIAGNOSIS — I739 Peripheral vascular disease, unspecified: Secondary | ICD-10-CM | POA: Insufficient documentation

## 2022-06-30 ENCOUNTER — Telehealth: Payer: Self-pay | Admitting: Pharmacist

## 2022-06-30 NOTE — Telephone Encounter (Signed)
Submitted a Prior Authorization RENEWAL request to [facility] for TYVASO DPI via CoverMyMeds. Will update once we receive a response.  Key: Paulla Fore, PharmD, MPH, BCPS, CPP Clinical Pharmacist (Rheumatology and Pulmonology)

## 2022-07-01 NOTE — Telephone Encounter (Signed)
Received notification from [facility] regarding a prior authorization for TYVASO DPI. Authorization has been APPROVED from 06/30/22 to 07/18/2023. Approval letter sent to scan center.  Patient must fill through [facility]: [phone]  Authorization # [id number] Phone # [phone]

## 2022-07-04 ENCOUNTER — Encounter (HOSPITAL_BASED_OUTPATIENT_CLINIC_OR_DEPARTMENT_OTHER): Payer: Medicare Other | Admitting: Internal Medicine

## 2022-07-04 DIAGNOSIS — I87312 Chronic venous hypertension (idiopathic) with ulcer of left lower extremity: Secondary | ICD-10-CM | POA: Diagnosis not present

## 2022-07-05 NOTE — Progress Notes (Signed)
[name] ([id number]) [id number] Page 1 of 8 Visit Report for 07/04/2022 Debridement Details Patient Name: Date of Service: [name], [name]. 07/04/2022 1:45 PM Medical Record Number: [id number] Patient Account Number: [id number] Date of Birth/Sex: Treating RN: 1946-06-13 (76 y.o. F) Primary Care Provider: Lorene Dy Other Clinician: Referring Provider: Treating Provider/Extender: Sabino Gasser in Treatment: 4 Debridement Performed for Assessment: Wound #4 Left,Proximal,Anterior Lower Leg Performed By: Physician Ricard Dillon., MD Debridement Type: Debridement Level of Consciousness (Pre-procedure): Awake and Alert Pre-procedure Verification/Time Out Yes - 14:06 Taken: Start Time: 14:07 T Area Debrided (L x W): otal 2.4 (cm) x 1.3 (cm) = 3.12 (cm) Tissue and other material debrided: Viable, Non-Viable, Slough, Subcutaneous, Slough Level: Skin/Subcutaneous Tissue Debridement Description: Excisional Instrument: Curette Bleeding: Minimum Hemostasis Achieved: Pressure Response to Treatment: Procedure was tolerated well Level of Consciousness (Post- Awake and Alert procedure): Post Debridement Measurements of Total Wound Length: (cm) 2.4 Width: (cm) 1.3 Depth: (cm) 0.2 Volume: (cm) 0.49 Character of Wound/Ulcer Post Debridement: Requires Further Debridement Post Procedure Diagnosis Same as Pre-procedure Notes Scribed for Dr. Dellia Nims by [name] Electronic Signature([name]) Signed: 07/04/2022 5:06:55 PM By: Hailey Ham MD Entered By: [name] on 07/04/2022 14:46:42 -------------------------------------------------------------------------------- Debridement Details Patient Name: Date of Service: [name], [name]. 07/04/2022 1:45 PM Medical Record Number: [id number] Patient Account Number: [id number] Date of Birth/Sex: Treating RN: [birth year] (76 y.o. F) Primary Care Provider: Lorene Dy Other Clinician: Referring Provider: Treating Provider/Extender: Sabino Gasser in Treatment: 4 Debridement Performed for Assessment: Wound #5 Left,Distal,Anterior Lower Leg Performed By: Physician Ricard Dillon., MD Debridement Type: Debridement Level of Consciousness (Pre-procedure): Awake and Alert Pre-procedure Verification/Time Out Yes - 14:06 Taken: [name], [name] ([id number]) [id number] Page 2 of 8 Start Time: 14:07 T Area Debrided (L x W): otal 5.2 (cm) x 3.5 (cm) = 18.2 (cm) Tissue and other material debrided: Viable, Non-Viable, Slough, Subcutaneous, Slough Level: Skin/Subcutaneous Tissue Debridement Description: Excisional Instrument: Curette Bleeding: Minimum Hemostasis Achieved: Pressure Response to Treatment: Procedure was tolerated well Level of Consciousness (Post- Awake and Alert procedure): Post Debridement Measurements of Total Wound Length: (cm) 5.2 Width: (cm) 3.5 Depth: (cm) 0.2 Volume: (cm) 2.859 Character of Wound/Ulcer Post Debridement: Requires Further Debridement Post Procedure Diagnosis Same as Pre-procedure Notes Scribed for Dr. Dellia Nims by Hailey East, RN Electronic Signature([name]) Signed: 07/04/2022 5:06:55 PM By: Hailey Ham MD Entered By: [name] on 07/04/2022 14:46:50 -------------------------------------------------------------------------------- HPI Details Patient Name: Date of Service: [name], [name]. 07/04/2022 1:45 PM Medical Record Number: [id number] Patient Account Number: [id number] Date of Birth/Sex: Treating RN: 20-Feb-1946 (76 y.o. F) Primary Care Provider: Lorene Dy Other Clinician: Referring Provider: Treating Provider/Extender: Sabino Gasser in Treatment: 4 History of Present Illness HPI Description: Patient presents today for initial evaluation and our clinic as a referral from the [facility]  Department of orthopedics for evaluation and treatment of the wound to his his of the left foot at the base of the great toe. Currently the good news is the patient really does not have any significant pain which is excellent. She has been seen by Dr. Linus Salmons at [facility]: infectious disease clinic that is the regional Center for infectious disease. Subsequently the patient was cultured and positive for methicillin sensitive staph aureus. She has been on antibiotics including Cipro prior to surgery as will several days following surgery. She then had clindamycin which was changed to doxycycline.  She took this for 10 days. Subsequently the pain had improved and there was no longer any possible draining from the wound according to the notes. The patient sedentary rate as well as C-reactive protein have returned to normal ranges. Currently per Dr. Henreitta Leber assessment there was one dehiscence with no sign of osteomyelitis. He therefore discontinue the antibiotics with the completion of the last three days Of doxycycline. Other than that he just set her for a follow-up as needed. The patient does not have diabetes and is not a current smoker. At this time her treatment that was recommended by the surgeon was applying a Hydrocolloid dressing. Based on what I'm seeing at this point the patient actually has some Slough noted over the surface of the wound there really does not appear to be evidence of infection though I Hailey think she does require some sharp debridement to clear away the slough and help with appropriate wound healing. She does have areas of granulation buds noted. She may be a candidate for a skin substitute as well. We will see how things Hailey over the next period of time until follow-up. No fevers, chills, nausea, or vomiting noted at this time. 06/13/18 on evaluation today patient actually appears to be doing better in regard to her toe ulcer. She'[name] been using the Prisma on this region and that seems  to have done extremely well for her. Fortunately there does not appear to be evidence of infection at this time which is good news. She did see her surgeon they were extremely pleased with the overall appearance of her wound. 06/27/18 on evaluation today patient actually appears to be doing very well in regard to her foot ulcer. She has been tolerating the dressing changes without complication which is great news. She did see her podiatrist they felt like everything is looking very nice as well and will please. 07/05/2018 surgical wound on the left foot. She appears to be doing well. Still surface debrided to remove however in general post debridement the wound bed looks quite healthy it is come down significantly in terms of dimensions using silver collagen. The patient describes pain when her foot is elevated either in bed at night [sometimes keeps her awake] or when is propped up on a foot rest. She basically takes analgesics for this. She does not really describe claudication with activity however her activity is very limited by interstitial lung disease. Her ABIs initially in this clinic were noncompressible. She is not a diabetic 07/20/2018; surgical wound on the left foot. Wound actually looks quite a bit better than I remember seeing this. She is still describing pain with her leg elevated at night that she does not get when the wound is supine. She does not really describe claudication but she is very limited by her pulmonary status. We have been using silver collagen. 1/17; the patient has been followed up by orthopedics and discharge. Her arterial studies were actually quite good and should not be contributing to any pain. Slight reduction in ABIs but otherwise normal. Her wound is closed. She saw Dr. Linus Salmons of infectious disease surrounding the surgery. By review of our notes CYTHINA, MICKELSEN ([id number]) [id number] Page 3 of 8 she was not felt to have  osteomyelitis. 06/03/2022 Ms. Elverda Wendel is a 76 year old female with a past medical history of interstitial lung disease on chronic oxygen via nasal cannula, rheumatoid arthritis, chronic diastolic heart failure and OSA that presents to the clinic for a 1 to 88-monthhistory of  nonhealing ulcer to the left lower extremity. On 04/20/2022 she was admitted to the hospital for left lower extremity cellulitis. She required 8 days of IV vancomycin and Unasyn followed by a 14-day course of Augmentin and doxycycline. She has been using Silvadene cream to the area. She does not use compression therapy but does own compression stockings. She currently denies signs of infection. 11/27; patient presents for follow-up she is using Medihoney and Hydrofera Blue to the wound beds. She has been using her Tubigrip. She has no issues or complaints today. 12/4; patient presents for follow-up. She continues to use Medihoney and Hydrofera Blue to the wound beds. Several attempts have been made to schedule ABIs with TBI'[name]. We gave patient the number today to call to have these scheduled. She has been using Tubigrip. She has no issues or complaints today. 12/18; she continues to use Medihoney and Hydrofera Blue in general the surface of the wound is looking somewhat better this week and measurements are slightly smaller. She did have her arterial studies done these were noncompressible bilaterally however TBI on the right of 0.69 on the left 0.62. Waveforms on the right were triphasic on the left biphasic to triphasic. This does not suggest significant arterial disease to make healing wounds at this location difficult. Electronic Signature([name]) Signed: 07/04/2022 5:06:55 PM By: Hailey Ham MD Entered By: [name] on 07/04/2022 14:48:31 -------------------------------------------------------------------------------- Physical Exam Details Patient Name: Date of Service: [name], [name]. 07/04/2022 1:45  PM Medical Record Number: [id number] Patient Account Number: [id number] Date of Birth/Sex: Treating RN: January 19, 1946 (76 y.o. F) Primary Care Provider: Lorene Dy Other Clinician: Referring Provider: Treating Provider/Extender: Sabino Gasser in Treatment: 4 Constitutional Patient is hypertensive.. Pulse regular and within target range for patient.Marland Kitchen Respirations regular, non-labored and within target range.. Temperature is normal and within the target range for the patient.. Oxygen on She appears well otherwise. Notes Wound exam; left lower extremity 2 open wounds larger is slightly inferior to the smaller superior wound. Her edema control looks quite good. No signs of surrounding infection. I used a #5 curette for reasonably aggressive debridement surface of the wound looks better however I suspect additional debridements will be necessary Electronic Signature([name]) Signed: 07/04/2022 5:06:55 PM By: Hailey Ham MD Entered By: [name] on 07/04/2022 14:49:46 -------------------------------------------------------------------------------- Physician Orders Details Patient Name: Date of Service: [place]. 07/04/2022 1:45 PM Medical Record Number: [id number] Patient Account Number: [id number] Date of Birth/Sex: Treating RN: [birth year] (76 y.o. Marta Lamas Primary Care Provider: Lorene Dy Other Clinician: Referring Provider: Treating Provider/Extender: Sabino Gasser in Treatment: 4 Verbal / Phone Orders: No Diagnosis Coding Follow-up Appointments ppointment in 1 week. - Dr. Heber Goodland Return A ppointment in 2 weeks. - Dr. Heber Hinckley Return [place], [place] ([id number]) [id number] Page 4 of 8 Anesthetic (In clinic) Topical Lidocaine 5% applied to wound bed Bathing/ Shower/ Hygiene May shower with protection but Hailey not get wound dressing([name]) wet. Edema Control - Lymphedema / SCD /  Other Elevate legs to the level of the heart or above for 30 minutes daily and/or when sitting, a frequency of: - 3-4 times a day throughout the day. Avoid standing for long periods of time. Wound Treatment Wound #4 - Lower Leg Wound Laterality: Left, Anterior, Proximal Cleanser: Wound Cleanser (Generic) 1 x Per Day/30 Days Discharge Instructions: Cleanse the wound with wound cleanser prior to applying a clean dressing using gauze sponges, not tissue or  cotton balls. Prim Dressing: MediHoney Gel, tube 1.5 (oz) 1 x Per Day/30 Days ary Discharge Instructions: Apply to wound bed as instructed Prim Dressing: Hydrofera Blue Ready Foam, 4x5 in (Generic) 1 x Per Day/30 Days ary Discharge Instructions: Apply to wound bed as instructed Secondary Dressing: ABD Pad, 8x10 1 x Per Day/30 Days Discharge Instructions: Apply over primary dressing as directed. Secured With: [employer], 4.5x3.1 (in/yd) (Generic) 1 x Per Day/30 Days Discharge Instructions: Secure with Kerlix as directed. Secured With: 21M Medipore H Soft Cloth Surgical T ape, 4 x 10 (in/yd) (Generic) 1 x Per Day/30 Days Discharge Instructions: Secure with tape as directed. Compression Wrap: TUBIGRIP SIZE E 1 x Per Day/30 Days Discharge Instructions: apply in the morning and remove at night. ONE LAYER Hailey NOT DOUBLE!!! Wound #5 - Lower Leg Wound Laterality: Left, Anterior, Distal Cleanser: Wound Cleanser (Generic) 1 x Per Day/30 Days Discharge Instructions: Cleanse the wound with wound cleanser prior to applying a clean dressing using gauze sponges, not tissue or cotton balls. Prim Dressing: MediHoney Gel, tube 1.5 (oz) 1 x Per Day/30 Days ary Discharge Instructions: Apply to wound bed as instructed Prim Dressing: Hydrofera Blue Ready Foam, 4x5 in (Generic) 1 x Per Day/30 Days ary Discharge Instructions: Apply to wound bed as instructed Secondary Dressing: ABD Pad, 8x10 1 x Per Day/30 Days Discharge Instructions: Apply over primary  dressing as directed. Secured With: [employer], 4.5x3.1 (in/yd) (Generic) 1 x Per Day/30 Days Discharge Instructions: Secure with Kerlix as directed. Secured With: 21M Medipore H Soft Cloth Surgical T ape, 4 x 10 (in/yd) (Generic) 1 x Per Day/30 Days Discharge Instructions: Secure with tape as directed. Compression Wrap: TUBIGRIP SIZE E 1 x Per Day/30 Days Discharge Instructions: apply in the morning and remove at night. ONE LAYER Hailey NOT DOUBLE!!! Electronic Signature([name]) Signed: 07/04/2022 5:06:55 PM By: Hailey Ham MD Signed: 07/04/2022 5:09:12 PM By: Hailey East RN Entered By: [name] on 07/04/2022 14:10:12 -------------------------------------------------------------------------------- Problem List Details Patient Name: Date of Service: [name], [name]. 07/04/2022 1:45 PM Medical Record Number: [id number] Patient Account Number: [id number] Date of Birth/Sex: Treating RN: 02-Jan-1946 (76 y.o. F) Primary Care Provider: Lorene Dy Other Clinician: [name] ([id number]) [id number] Page 5 of 8 Referring Provider: Treating Provider/Extender: Sabino Gasser in Treatment: 4 Active Problems ICD-10 Encounter Code Description Active Date MDM Diagnosis L97.828 Non-pressure chronic ulcer of other part of left lower leg with other specified 06/03/2022 No Yes severity I87.312 Chronic venous hypertension (idiopathic) with ulcer of left lower extremity 06/03/2022 No Yes J84.9 Interstitial pulmonary disease, unspecified 06/03/2022 No Yes M06.9 Rheumatoid arthritis, unspecified 06/03/2022 No Yes Inactive Problems Resolved Problems Electronic Signature([name]) Signed: 07/04/2022 5:06:55 PM By: Hailey Ham MD Entered By: [name] on 07/04/2022 14:46:27 -------------------------------------------------------------------------------- Progress Note Details Patient Name: Date of Service: [name], [name].  07/04/2022 1:45 PM Medical Record Number: [id number] Patient Account Number: [id number] Date of Birth/Sex: Treating RN: April 06, 1946 (76 y.o. F) Primary Care Provider: Lorene Dy Other Clinician: Referring Provider: Treating Provider/Extender: Sabino Gasser in Treatment: 4 Subjective History of Present Illness (HPI) Patient presents today for initial evaluation and our clinic as a referral from the [facility] Department of orthopedics for evaluation and treatment of the wound to his his of the left foot at the base of the great toe. Currently the good news is the patient really does not have any significant pain which is excellent.  She has been seen by Dr. Linus Salmons at [facility]: infectious disease clinic that is the regional Center for infectious disease. Subsequently the patient was cultured and positive for methicillin sensitive staph aureus. She has been on antibiotics including Cipro prior to surgery as will several days following surgery. She then had clindamycin which was changed to doxycycline. She took this for 10 days. Subsequently the pain had improved and there was no longer any possible draining from the wound according to the notes. The patient sedentary rate as well as C-reactive protein have returned to normal ranges. Currently per Dr. Henreitta Leber assessment there was one dehiscence with no sign of osteomyelitis. He therefore discontinue the antibiotics with the completion of the last three days Of doxycycline. Other than that he just set her for a follow-up as needed. The patient does not have diabetes and is not a current smoker. At this time her treatment that was recommended by the surgeon was applying a Hydrocolloid dressing. Based on what I'm seeing at this point the patient actually has some Slough noted over the surface of the wound there really does not appear to be evidence of infection though I Hailey think she does require some sharp  debridement to clear away the slough and help with appropriate wound healing. She does have areas of granulation buds noted. She may be a candidate for a skin substitute as well. We will see how things Hailey over the next period of time until follow-up. No fevers, chills, nausea, or vomiting noted at this time. 06/13/18 on evaluation today patient actually appears to be doing better in regard to her toe ulcer. She'[name] been using the Prisma on this region and that seems to have done extremely well for her. Fortunately there does not appear to be evidence of infection at this time which is good news. She did see her surgeon they were extremely pleased with the overall appearance of her wound. 06/27/18 on evaluation today patient actually appears to be doing very well in regard to her foot ulcer. She has been tolerating the dressing changes without complication which is great news. She did see her podiatrist they felt like everything is looking very nice as well and will please. 07/05/2018 surgical wound on the left foot. She appears to be doing well. Still surface debrided to remove however in general post debridement the wound bed looks quite healthy it is come down significantly in terms of dimensions using silver collagen. The patient describes pain when her foot is elevated either in bed at night [sometimes keeps her awake] or when is propped up on a foot rest. She basically [name], [name] ([id number]) [phone].pdf Page 6 of 8 takes analgesics for this. She does not really describe claudication with activity however her activity is very limited by interstitial lung disease. Her ABIs initially in this clinic were noncompressible. She is not a diabetic 07/20/2018; surgical wound on the left foot. Wound actually looks quite a bit better than I remember seeing this. She is still describing pain with her leg elevated at night that she does not get when the wound is supine. She does  not really describe claudication but she is very limited by her pulmonary status. We have been using silver collagen. 1/17; the patient has been followed up by orthopedics and discharge. Her arterial studies were actually quite good and should not be contributing to any pain. Slight reduction in ABIs but otherwise normal. Her wound is closed. She saw Dr. Linus Salmons of infectious disease surrounding  the surgery. By review of our notes she was not felt to have osteomyelitis. 06/03/2022 Ms. Maddison Kilner is a 76 year old female with a past medical history of interstitial lung disease on chronic oxygen via nasal cannula, rheumatoid arthritis, chronic diastolic heart failure and OSA that presents to the clinic for a 1 to 50-monthhistory of nonhealing ulcer to the left lower extremity. On 04/20/2022 she was admitted to the hospital for left lower extremity cellulitis. She required 8 days of IV vancomycin and Unasyn followed by a 14-day course of Augmentin and doxycycline. She has been using Silvadene cream to the area. She does not use compression therapy but does own compression stockings. She currently denies signs of infection. 11/27; patient presents for follow-up she is using Medihoney and Hydrofera Blue to the wound beds. She has been using her Tubigrip. She has no issues or complaints today. 12/4; patient presents for follow-up. She continues to use Medihoney and Hydrofera Blue to the wound beds. Several attempts have been made to schedule ABIs with TBI'[name]. We gave patient the number today to call to have these scheduled. She has been using Tubigrip. She has no issues or complaints today. 12/18; she continues to use Medihoney and Hydrofera Blue in general the surface of the wound is looking somewhat better this week and measurements are slightly smaller. She did have her arterial studies done these were noncompressible bilaterally however TBI on the right of 0.69 on the left 0.62. Waveforms on the right  were triphasic on the left biphasic to triphasic. This does not suggest significant arterial disease to make healing wounds at this location difficult. Objective Constitutional Patient is hypertensive.. Pulse regular and within target range for patient..Marland KitchenRespirations regular, non-labored and within target range.. Temperature is normal and within the target range for the patient.. Oxygen on She appears well otherwise. Vitals Time Taken: 1:37 AM, Height: 61 in, Weight: 186 lbs, BMI: 35.1, Temperature: 98.7 F, Pulse: 121 bpm, Respiratory Rate: 18 breaths/min, Blood Pressure: 160/95 mmHg. General Notes: Wound exam; left lower extremity 2 open wounds larger is slightly inferior to the smaller superior wound. Her edema control looks quite good. No signs of surrounding infection. I used a #5 curette for reasonably aggressive debridement surface of the wound looks better however I suspect additional debridements will be necessary Integumentary (Hair, Skin) Wound #4 status is Open. Original cause of wound was Gradually Appeared. The date acquired was: 04/19/2022. The wound has been in treatment 4 weeks. The wound is located on the Left,Proximal,Anterior Lower Leg. The wound measures 2.4cm length x 1.5cm width x 0.2cm depth; 2.827cm^2 area and 0.565cm^3 volume. There is Fat Layer (Subcutaneous Tissue) exposed. There is no tunneling or undermining noted. There is a medium amount of serosanguineous drainage noted. The wound margin is distinct with the outline attached to the wound base. There is medium (34-66%) red, pink granulation within the wound bed. There is a medium (34-66%) amount of necrotic tissue within the wound bed. The periwound skin appearance did not exhibit: Callus, Crepitus, Excoriation, Induration, Rash, Scarring, Dry/Scaly, Maceration, Atrophie Hailey, Cyanosis, Ecchymosis, Hemosiderin Staining, Mottled, Pallor, Rubor, Erythema. Periwound temperature was noted as No Abnormality. The  periwound has tenderness on palpation. Wound #5 status is Open. Original cause of wound was Gradually Appeared. The date acquired was: 04/19/2022. The wound has been in treatment 4 weeks. [facility]. The wound measures 5.2cm length x 3.5cm width x 0.2cm depth; 14.294cm^2 area and 2.859cm^3 volume. There is Fat  Layer (Subcutaneous Tissue) exposed. There is no tunneling or undermining noted. There is a medium amount of serosanguineous drainage noted. The wound margin is distinct with the outline attached to the wound base. There is medium (34-66%) red, pink granulation within the wound bed. There is a medium (34-66%) amount of necrotic tissue within the wound bed including Adherent Slough. The periwound skin appearance did not exhibit: Callus, Crepitus, Excoriation, Induration, Rash, Scarring, Dry/Scaly, Maceration, Atrophie Hailey, Cyanosis, Ecchymosis, Hemosiderin Staining, Mottled, Pallor, Rubor, Erythema. Periwound temperature was noted as No Abnormality. The periwound has tenderness on palpation. 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 There was a Excisional Skin/Subcutaneous Tissue [name], [name] ([id number]) [id number] Page 7 of 8 Debridement with a total area of 3.12 sq cm performed by Ricard Dillon., MD. With the following instrument([name]): Curette to remove Viable and Non-Viable tissue/material. Material removed includes Subcutaneous Tissue and Slough and. No specimens were taken. A time out was conducted at 14:06, prior to the start of the procedure. A  Minimum amount of bleeding was controlled with Pressure. The procedure was tolerated well. Post Debridement Measurements: 2.4cm length x 1.3cm width x 0.2cm depth; 0.49cm^3 volume. Character of Wound/Ulcer Post Debridement requires further debridement. Post procedure Diagnosis Wound #4: Same as Pre-Procedure General Notes: Scribed for Dr. Dellia Nims by [name]. Wound #5 Pre-procedure diagnosis of Wound #5 is a Cellulitis located on the Left,Distal,Anterior Lower Leg . There was a Excisional Skin/Subcutaneous Tissue Debridement with a total area of 18.2 sq cm performed by Ricard Dillon., MD. With the following instrument([name]): Curette to remove Viable and Non-Viable tissue/material. Material removed includes Subcutaneous Tissue and Slough and. No specimens were taken. A time out was conducted at 14:06, prior to the start of the procedure. A Minimum amount of bleeding was controlled with Pressure. The procedure was tolerated well. Post Debridement Measurements: 5.2cm length x 3.5cm width x 0.2cm depth; 2.859cm^3 volume. Character of Wound/Ulcer Post Debridement requires further debridement. Post procedure Diagnosis Wound #5: Same as Pre-Procedure General Notes: Scribed for Dr. Dellia Nims by Hailey East, RN. Plan Follow-up Appointments: Return Appointment in 1 week. - Dr. Heber [place] Return Appointment in 2 weeks. - Dr. Heber [place] Anesthetic: (In clinic) Topical Lidocaine 5% applied to wound bed Bathing/ Shower/ Hygiene: May shower with protection but Hailey not get wound dressing([name]) wet. Edema Control - Lymphedema / SCD / Other: Elevate legs to the level of the heart or above for 30 minutes daily and/or when sitting, a frequency of: - 3-4 times a day throughout the day. Avoid standing for long periods of time. WOUND #4: - Lower Leg Wound Laterality: Left, Anterior, Proximal Cleanser: Wound Cleanser (Generic) 1 x Per Day/30 Days Discharge Instructions: Cleanse the wound with wound cleanser prior to  applying a clean dressing using gauze sponges, not tissue or cotton balls. Prim Dressing: MediHoney Gel, tube 1.5 (oz) 1 x Per Day/30 Days ary Discharge Instructions: Apply to wound bed as instructed Prim Dressing: Hydrofera Blue Ready Foam, 4x5 in (Generic) 1 x Per Day/30 Days ary Discharge Instructions: Apply to wound bed as instructed Secondary Dressing: ABD Pad, 8x10 1 x Per Day/30 Days Discharge Instructions: Apply over primary dressing as directed. Secured With: [employer], 4.5x3.1 (in/yd) (Generic) 1 x Per  Day/30 Days Discharge Instructions: Secure with Kerlix as directed. Secured With: 791M Medipore H Soft Cloth Surgical T ape, 4 x 10 (in/yd) (Generic) 1 x Per Day/30 Days Discharge Instructions: Secure with tape as directed. Com pression Wrap: TUBIGRIP SIZE E 1 x Per Day/30 Days Discharge Instructions: apply in the morning and remove at night. ONE LAYER Hailey NOT DOUBLE!!! WOUND #5: - Lower Leg Wound Laterality: Left, Anterior, Distal Cleanser: Wound Cleanser (Generic) 1 x Per Day/30 Days Discharge Instructions: Cleanse the wound with wound cleanser prior to applying a clean dressing using gauze sponges, not tissue or cotton balls. Prim Dressing: MediHoney Gel, tube 1.5 (oz) 1 x Per Day/30 Days ary Discharge Instructions: Apply to wound bed as instructed Prim Dressing: Hydrofera Blue Ready Foam, 4x5 in (Generic) 1 x Per Day/30 Days ary Discharge Instructions: Apply to wound bed as instructed Secondary Dressing: ABD Pad, 8x10 1 x Per Day/30 Days Discharge Instructions: Apply over primary dressing as directed. Secured With: [employer], 4.5x3.1 (in/yd) (Generic) 1 x Per Day/30 Days Discharge Instructions: Secure with Kerlix as directed. Secured With: 791M Medipore H Soft Cloth Surgical T ape, 4 x 10 (in/yd) (Generic) 1 x Per Day/30 Days Discharge Instructions: Secure with tape as directed. Com pression Wrap: TUBIGRIP SIZE E 1 x Per Day/30 Days Discharge Instructions:  apply in the morning and remove at night. ONE LAYER Hailey NOT DOUBLE!!! 1. Debridement with a #5 curette continues to be necessary and I suspect further debridements are going to be necessary until we get the surface to a healthier looking surface. 2. At that point consideration of a skin substitute question Apligraf. 3. Her edema control is reasonably good 4. She does not have significant arterial disease Electronic Signature([name]) Signed: 07/04/2022 5:06:55 PM By: Hailey Ham MD Entered By: [name] on 07/04/2022 14:51:14 [name] ([id number]) [id number] Page 8 of 8 -------------------------------------------------------------------------------- SuperBill Details Patient Name: Date of Service: Hailey Hailey App, [name]. 07/04/2022 Medical Record Number: [id number] Patient Account Number: [id number] Date of Birth/Sex: Treating RN: 1946-04-29 (76 y.o. F) Primary Care Provider: Lorene Dy Other Clinician: Referring Provider: Treating Provider/Extender: Sabino Gasser in Treatment: 4 Diagnosis Coding ICD-10 Codes Code Description [phone] Non-pressure chronic ulcer of other part of left lower leg with other specified severity I87.312 Chronic venous hypertension (idiopathic) with ulcer of left lower extremity J84.9 Interstitial pulmonary disease, unspecified M06.9 Rheumatoid arthritis, unspecified Facility Procedures : CPT4 Code: [id number] Description: 90172 - DEB SUBQ TISSUE 20 SQ CM/< ICD-10 Diagnosis Description L97.828 Non-pressure chronic ulcer of other part of left lower leg with other specified Modifier: severity Quantity: 1 : CPT4 Code: [id number] Description: 14439 - DEB SUBQ TISS EA ADDL 20CM ICD-10 Diagnosis Description L97.828 Non-pressure chronic ulcer of other part of left lower leg with other specified Modifier: severity Quantity: 1 Physician Procedures : CPT4 Code Description Modifier [id number] 11042 - WC PHYS  SUBQ TISS 20 SQ CM ICD-10 Diagnosis Description L97.828 Non-pressure chronic ulcer of other part of left lower leg with other specified severity Quantity: 1 : [id number] 88520 - WC PHYS SUBQ TISS EA ADDL 20 CM ICD-10 Diagnosis Description L97.828 Non-pressure chronic ulcer of other part of left lower leg with other specified severity Quantity: 1 Electronic Signature([name]) Signed: 07/04/2022 5:06:55 PM By: Hailey Ham MD Entered By: [name] on 07/04/2022 14:53:47

## 2022-07-05 NOTE — Progress Notes (Signed)
Hailey, [name] ([id number]) [id number] Page 1 of 10 Visit Report for 07/04/2022 Arrival Information Details Patient Name: Date of Service: Hailey Courser, DO [name] 07/04/2022 1:45 PM Medical Record Number: [id number] Patient Account Number: [id number] Date of Birth/Sex: Treating RN: 05-22-1946 (76 y.o. F) Primary Care Lorik Guo: Lorene Dy Other Clinician: Referring Bellarae Lizer: Treating Keddrick Wyne/Extender: Sabino Gasser in Treatment: 4 Visit Information History Since Last Visit All ordered tests and consults were completed: No Patient Arrived: Wheel Chair Added or deleted any medications: No Arrival Time: 13:36 Any new allergies or adverse reactions: No Accompanied By: spouse Had a fall or experienced change in No Transfer Assistance: [name] activities of daily living that may affect Patient Identification Verified: Yes risk of falls: Secondary Verification Process Completed: Yes Signs or symptoms of abuse/neglect since last visito No Patient Requires Transmission-Based Precautions: No Hospitalized since last visit: No Patient Has Alerts: Yes Implantable device outside of the clinic excluding No Patient Alerts: ABI's: L: N/C 11/23 cellular tissue based products placed in the center since last visit: Pain Present Now: No Electronic Signature(s) Signed: 07/04/2022 4:30:07 PM By: Worthy Rancher Entered By: Worthy Rancher on 07/04/2022 13:37:12 -------------------------------------------------------------------------------- Encounter Discharge Information Details Patient Name: Date of Service: [name], DO [name] 07/04/2022 1:45 PM Medical Record Number: [id number] Patient Account Number: [id number] Date of Birth/Sex: Treating RN: 1945-12-22 (75 y.o. Marta Lamas Primary Care Jakolby Sedivy: Lorene Dy Other Clinician: Referring Hideo Googe: Treating Saveah Bahar/Extender: Sabino Gasser in Treatment: 4 Encounter  Discharge Information Items Post Procedure Vitals Discharge Condition: Stable Temperature (F): 98.7 Ambulatory Status: Ambulatory Pulse (bpm): 121 Discharge Destination: Home Respiratory Rate (breaths/min): 18 Transportation: Private Auto Blood Pressure (mmHg): 160/95 Accompanied By: spouse Schedule Follow-up Appointment: No Clinical Summary of Care: Electronic Signature(s) Signed: 07/04/2022 5:09:12 PM By: Blanche East RN Entered By: Blanche East on 07/04/2022 14:12:21 [name] ([id number]) [id number] Page 2 of 10 -------------------------------------------------------------------------------- Lower Extremity Assessment Details Patient Name: Date of Service: [name] RO [place] S. 07/04/2022 1:45 PM Medical Record Number: [id number] Patient Account Number: [id number] Date of Birth/Sex: Treating RN: 07/16/1946 (76 y.o. Marta Lamas Primary Care Lonnell Chaput: Lorene Dy Other Clinician: Referring Pinkney Venard: Treating Aoki Wedemeyer/Extender: Sabino Gasser in Treatment: 4 Edema Assessment Assessed: [name]: No] [name]: No] Edema: [Left: Ye] [Right: s] Calf Left: Right: Point of Measurement: 29 cm From Medial Instep 31.5 cm Ankle Left: Right: Point of Measurement: 9 cm From Medial Instep 25 cm Vascular Assessment Pulses: Dorsalis Pedis Palpable: [Left:Yes] Electronic Signature(s) Signed: 07/04/2022 5:09:12 PM By: Blanche East RN Entered By: Blanche East on 07/04/2022 13:50:42 -------------------------------------------------------------------------------- Multi Wound Chart Details Patient Name: Date of Service: Hailey Courser, DO [name] 07/04/2022 1:45 PM Medical Record Number: [id number] Patient Account Number: [id number] Date of Birth/Sex: Treating RN: May 27, 1946 (76 y.o. F) Primary Care Loye Reininger: Lorene Dy Other Clinician: Referring Edouard Gikas: Treating Prince Olivier/Extender: Sabino Gasser in  Treatment: 4 Vital Signs Height(in): 61 Pulse(bpm): 121 Weight(lbs): 186 Blood Pressure(mmHg): 160/95 Body Mass Index(BMI): 35.1 Temperature(F): 98.7 Respiratory Rate(breaths/min): 18 [4:Photos:] [N/A:N/A [id number] Page 3 of 10] Left, Proximal, Anterior Lower Leg Left, Distal, Anterior Lower Leg N/A Wound Location: Gradually Appeared Gradually Appeared N/A Wounding Event: Cellulitis Cellulitis N/A Primary Etiology: Asthma, Sleep Apnea, Hypertension, Asthma, Sleep Apnea, Hypertension, N/A Comorbid History: Colitis, Rheumatoid Arthritis, Colitis, Rheumatoid Arthritis, Osteoarthritis Osteoarthritis 04/19/2022 04/19/2022 N/A Date Acquired: 4 4 N/A Weeks of Treatment: Open Open N/A Wound Status: No No N/A Wound Recurrence:  2.4x1.5x0.2 5.2x3.5x0.2 N/A Measurements L x W x D (cm) 2.827 14.294 N/A A (cm) : rea 0.565 2.859 N/A Volume (cm) : 33.30% 8.10% N/A % Reduction in A rea: 55.60% -83.90% N/A % Reduction in Volume: Full Thickness With Exposed Support Full Thickness With Exposed Support N/A Classification: Structures Structures Medium Medium N/A Exudate A mount: Serosanguineous Serosanguineous N/A Exudate Type: red, brown red, brown N/A Exudate Color: Distinct, outline attached Distinct, outline attached N/A Wound Margin: Medium (34-66%) Medium (34-66%) N/A Granulation A mount: Red, Pink Red, Pink N/A Granulation Quality: Medium (34-66%) Medium (34-66%) N/A Necrotic A mount: Fat Layer (Subcutaneous Tissue): Yes Fat Layer (Subcutaneous Tissue): Yes N/A Exposed Structures: Fascia: No Fascia: No Tendon: No Tendon: No Muscle: No Muscle: No Joint: No Joint: No Bone: No Bone: No Small (1-33%) Small (1-33%) N/A Epithelialization: Debridement - Excisional Debridement - Excisional N/A Debridement: Pre-procedure Verification/Time Out 14:06 14:06 N/A Taken: Subcutaneous, Slough Subcutaneous, Slough N/A Tissue  Debrided: Skin/Subcutaneous Tissue Skin/Subcutaneous Tissue N/A Level: 3.12 18.2 N/A Debridement A (sq cm): rea Curette Curette N/A Instrument: Minimum Minimum N/A Bleeding: Pressure Pressure N/A Hemostasis A chieved: Procedure was tolerated well Procedure was tolerated well N/A Debridement Treatment Response: 2.4x1.3x0.2 5.2x3.5x0.2 N/A Post Debridement Measurements L x W x D (cm) 0.49 2.859 N/A Post Debridement Volume: (cm) Excoriation: No Excoriation: No N/A Periwound Skin Texture: Induration: No Induration: No Callus: No Callus: No Crepitus: No Crepitus: No Rash: No Rash: No Scarring: No Scarring: No Maceration: No Maceration: No N/A Periwound Skin Moisture: Dry/Scaly: No Dry/Scaly: No Atrophie Blanche: No Atrophie Blanche: No N/A Periwound Skin Color: Cyanosis: No Cyanosis: No Ecchymosis: No Ecchymosis: No Erythema: No Erythema: No Hemosiderin Staining: No Hemosiderin Staining: No Mottled: No Mottled: No Pallor: No Pallor: No Rubor: No Rubor: No No Abnormality No Abnormality N/A Temperature: Yes Yes N/A Tenderness on Palpation: Debridement Debridement N/A Procedures Performed: Treatment Notes Wound #4 (Lower Leg) Wound Laterality: Left, Anterior, Proximal Cleanser Wound Cleanser Discharge Instruction: Cleanse the wound with wound cleanser prior to applying a clean dressing using gauze sponges, not tissue or cotton balls. Peri-Wound Care Topical Primary Dressing MediHoney Gel, tube 1.5 (oz) Discharge Instruction: Apply to wound bed as instructed Hydrofera Blue Ready Foam, 4x5 in Discharge Instruction: Apply to wound bed as instructed Secondary Dressing MICHEL, ESKELSON ([id number]) [phone] Page 4 of 10 ABD Pad, 8x10 Discharge Instruction: Apply over primary dressing as directed. Secured With [employer], 4.5x3.1 (in/yd) Discharge Instruction: Secure with Kerlix as directed. 20M Medipore H Soft Cloth  Surgical T ape, 4 x 10 (in/yd) Discharge Instruction: Secure with tape as directed. Compression Wrap TUBIGRIP SIZE E Discharge Instruction: apply in the morning and remove at night. ONE LAYER DO NOT DOUBLE!!! Compression Stockings Add-Ons Wound #5 (Lower Leg) Wound Laterality: Left, Anterior, Distal Cleanser Wound Cleanser Discharge Instruction: Cleanse the wound with wound cleanser prior to applying a clean dressing using gauze sponges, not tissue or cotton balls. Peri-Wound Care Topical Primary Dressing MediHoney Gel, tube 1.5 (oz) Discharge Instruction: Apply to wound bed as instructed Hydrofera Blue Ready Foam, 4x5 in Discharge Instruction: Apply to wound bed as instructed Secondary Dressing ABD Pad, 8x10 Discharge Instruction: Apply over primary dressing as directed. Secured With [employer], 4.5x3.1 (in/yd) Discharge Instruction: Secure with Kerlix as directed. 20M Medipore H Soft Cloth Surgical T ape, 4 x 10 (in/yd) Discharge Instruction: Secure with tape as directed. Compression Wrap TUBIGRIP SIZE E Discharge Instruction: apply in the morning and remove at night. ONE LAYER DO NOT DOUBLE!!! Compression  Stockings Environmental education officer) Signed: 07/04/2022 5:06:55 PM By: Linton Ham MD Entered By: Linton Ham on 07/04/2022 14:46:33 -------------------------------------------------------------------------------- Multi-Disciplinary Care Plan Details Patient Name: Date of Service: [place]. 07/04/2022 1:45 PM Medical Record Number: [id number] Patient Account Number: [id number] Date of Birth/Sex: Treating RN: [birth year] (76 y.o. Marta Lamas Primary Care Addysyn Fern: Lorene Dy Other Clinician: Referring Alda Gaultney: Treating Makella Buckingham/Extender: Sabino Gasser in Treatment: [address] Arvil Persons ([id number]) [id number] Page 5 of 10 Active Inactive Pain, Acute or Chronic Nursing  Diagnoses: Pain, acute or chronic: actual or potential Potential alteration in comfort, pain Goals: Patient will verbalize adequate pain control and receive pain control interventions during procedures as needed Date Initiated: 06/03/2022 Target Resolution Date: 09/15/2022 Goal Status: Active Patient/caregiver will verbalize comfort level met Date Initiated: 06/03/2022 Target Resolution Date: 09/15/2022 Goal Status: Active Interventions: Encourage patient to take pain medications as prescribed Provide education on pain management Treatment Activities: Administer pain control measures as ordered : 06/03/2022 Notes: Venous Leg Ulcer Nursing Diagnoses: Potential for venous Insuffiency (use before diagnosis confirmed) Goals: Patient will maintain optimal edema control Date Initiated: 06/03/2022 Target Resolution Date: 09/15/2022 Goal Status: Active Interventions: Assess peripheral edema status every visit. Compression as ordered Provide education on venous insufficiency Treatment Activities: Therapeutic compression applied : 06/03/2022 Notes: Electronic Signature(s) Signed: 07/04/2022 5:09:12 PM By: Blanche East RN Entered By: Blanche East on 07/04/2022 14:10:33 -------------------------------------------------------------------------------- Pain Assessment Details Patient Name: Date of Service: [place]. 07/04/2022 1:45 PM Medical Record Number: [id number] Patient Account Number: [id number] Date of Birth/Sex: Treating RN: July 22, 1945 (76 y.o. F) Primary Care Amando Ishikawa: Lorene Dy Other Clinician: Referring Dorman Calderwood: Treating Mandie Crabbe/Extender: Sabino Gasser in Treatment: 4 Active Problems Location of Pain Severity and Description of Pain Patient Has Paino No Site Locations [place], [place] ([id number]) [id number] Page 6 of 10 Pain Management and Medication Current Pain Management: Electronic  Signature(s) Signed: 07/04/2022 4:30:07 PM By: Worthy Rancher Entered By: Worthy Rancher on 07/04/2022 13:37:48 -------------------------------------------------------------------------------- Patient/Caregiver Education Details Patient Name: Date of Service: [place] [name] 12/18/2023andnbsp1:45 PM Medical Record Number: [id number] Patient Account Number: [id number] Date of Birth/Gender: Treating RN: 1946/03/15 (76 y.o. Marta Lamas Primary Care Physician: Lorene Dy Other Clinician: Referring Physician: Treating Physician/Extender: Sabino Gasser in Treatment: 4 Education Assessment Education Provided To: Patient Education Topics Provided Wound Debridement: Methods: Explain/Verbal Responses: Reinforcements needed, State content correctly Electronic Signature(s) Signed: 07/04/2022 5:09:12 PM By: Blanche East RN Entered By: Blanche East on 07/04/2022 14:10:58 -------------------------------------------------------------------------------- Wound Assessment Details Patient Name: Date of Service: Hailey Courser, DO [name] 07/04/2022 1:45 PM Medical Record Number: [id number] Patient Account Number: [id number] Date of Birth/Sex: Treating RN: June 25, 1946 (76 y.o. Marta Lamas Primary Care Dakin Madani: Lorene Dy Other Clinician: Referring Montel Vanderhoof: Treating Shaddai Shapley/Extender: [place], [place], Arvil Persons ([id number]) [id number] Page 7 of 10 Weeks in Treatment: 4 Wound Status Wound Number: 4 Primary Cellulitis Etiology: Wound Location: Left, Proximal, Anterior Lower Leg Wound Open Wounding Event: Gradually Appeared Status: Date Acquired: 04/19/2022 Comorbid Asthma, Sleep Apnea, Hypertension, Colitis, Rheumatoid Weeks Of Treatment: 4 History: Arthritis, Osteoarthritis Clustered Wound: No Photos Wound Measurements Length: (cm) 2.4 Width: (cm) 1.5 Depth: (cm) 0.2 Area: (cm) 2.827 Volume: (cm)  0.565 % Reduction in Area: 33.3% % Reduction in Volume: 55.6% Epithelialization: Small (1-33%) Tunneling: No Undermining: No Wound Description Classification: Full Thickness With Exposed Support Structures Wound Margin: Distinct, outline attached Exudate Amount:  Medium Exudate Type: Serosanguineous Exudate Color: red, brown Foul Odor After Cleansing: No Slough/Fibrino Yes Wound Bed Granulation Amount: Medium (34-66%) Exposed Structure Granulation Quality: Red, Pink Fascia Exposed: No Necrotic Amount: Medium (34-66%) Fat Layer (Subcutaneous Tissue) Exposed: Yes Tendon Exposed: No Muscle Exposed: No Joint Exposed: No Bone Exposed: No Periwound Skin Texture Texture Color No Abnormalities Noted: No No Abnormalities Noted: No Callus: No Atrophie Blanche: No Crepitus: No Cyanosis: No Excoriation: No Ecchymosis: No Induration: No Erythema: No Rash: No Hemosiderin Staining: No Scarring: No Mottled: No Pallor: No Moisture Rubor: No No Abnormalities Noted: No Dry / Scaly: No Temperature / Pain Maceration: No Temperature: No Abnormality Tenderness on Palpation: Yes Treatment Notes Wound #4 (Lower Leg) Wound Laterality: Left, Anterior, Proximal Cleanser Wound Cleanser Discharge Instruction: Cleanse the wound with wound cleanser prior to applying a clean dressing using gauze sponges, not tissue or cotton balls. Peri-Wound Care Topical SHANITHA, TWINING ([id number]) [id number] Page 8 of 10 Primary Dressing MediHoney Gel, tube 1.5 (oz) Discharge Instruction: Apply to wound bed as instructed Hydrofera Blue Ready Foam, 4x5 in Discharge Instruction: Apply to wound bed as instructed Secondary Dressing ABD Pad, 8x10 Discharge Instruction: Apply over primary dressing as directed. Secured With [employer], 4.5x3.1 (in/yd) Discharge Instruction: Secure with Kerlix as directed. 27M Medipore H Soft Cloth Surgical T ape, 4 x 10  (in/yd) Discharge Instruction: Secure with tape as directed. Compression Wrap TUBIGRIP SIZE E Discharge Instruction: apply in the morning and remove at night. ONE LAYER DO NOT DOUBLE!!! Compression Stockings Add-Ons Electronic Signature(s) Signed: 07/04/2022 5:09:12 PM By: Blanche East RN Entered By: Blanche East on 07/04/2022 13:52:28 -------------------------------------------------------------------------------- Wound Assessment Details Patient Name: Date of Service: [name]. 07/04/2022 1:45 PM Medical Record Number: [id number] Patient Account Number: [id number] Date of Birth/Sex: Treating RN: [birth year] (76 y.o. F) Primary Care Stevin Bielinski: Lorene Dy Other Clinician: Referring Zinedine Ellner: Treating Jolena Kittle/Extender: Sabino Gasser in Treatment: 4 Wound Status Wound Number: 5 Primary Cellulitis Etiology: Wound Location: Left, Distal, Anterior Lower Leg Wound Open Wounding Event: Gradually Appeared Status: Date Acquired: 04/19/2022 Comorbid Asthma, Sleep Apnea, Hypertension, Colitis, Rheumatoid Weeks Of Treatment: 4 History: Arthritis, Osteoarthritis Clustered Wound: No Photos Wound Measurements Length: (cm) 5.2 Width: (cm) 3.5 Depth: (cm) 0.2 Area: (cm) 14.294 Volume: (cm) 2.859 CASIE, STURGEON ([id number]) Wound Description Classification: Full Thickness With Exposed Support Structures Wound Margin: Distinct, outline attached Exudate Amount: Medium Exudate Type: Serosanguineous Exudate Color: red, brown Foul Odor After Cleansing: No Slough/Fibrino Yes % Reduction in Area: 8.1% % Reduction in Volume: -83.9% Epithelialization: Small (1-33%) Tunneling: No Undermining: No [id number] Page 9 of 10 Wound Bed Granulation Amount: Medium (34-66%) Exposed Structure Granulation Quality: Red, Pink Fascia Exposed: No Necrotic Amount: Medium (34-66%) Fat Layer (Subcutaneous Tissue) Exposed: Yes Necrotic  Quality: Adherent Slough Tendon Exposed: No Muscle Exposed: No Joint Exposed: No Bone Exposed: No Periwound Skin Texture Texture Color No Abnormalities Noted: No No Abnormalities Noted: No Callus: No Atrophie Blanche: No Crepitus: No Cyanosis: No Excoriation: No Ecchymosis: No Induration: No Erythema: No Rash: No Hemosiderin Staining: No Scarring: No Mottled: No Pallor: No Moisture Rubor: No No Abnormalities Noted: No Dry / Scaly: No Temperature / Pain Maceration: No Temperature: No Abnormality Tenderness on Palpation: Yes Treatment Notes Wound #5 (Lower Leg) Wound Laterality: Left, Anterior, Distal Cleanser Wound Cleanser Discharge Instruction: Cleanse the wound with wound cleanser prior to applying a clean dressing using gauze sponges, not tissue or cotton balls. Peri-Wound Care Topical Primary Dressing MediHoney  Gel, tube 1.5 (oz) Discharge Instruction: Apply to wound bed as instructed Hydrofera Blue Ready Foam, 4x5 in Discharge Instruction: Apply to wound bed as instructed Secondary Dressing ABD Pad, 8x10 Discharge Instruction: Apply over primary dressing as directed. Secured With [employer], 4.5x3.1 (in/yd) Discharge Instruction: Secure with Kerlix as directed. 57M Medipore H Soft Cloth Surgical T ape, 4 x 10 (in/yd) Discharge Instruction: Secure with tape as directed. Compression Wrap TUBIGRIP SIZE E Discharge Instruction: apply in the morning and remove at night. ONE LAYER DO NOT DOUBLE!!! Compression Stockings Add-Ons Electronic Signature(s) Signed: 07/04/2022 5:09:12 PM By: Blanche East RN Entered By: Blanche East on 07/04/2022 13:52:38 [name] ([id number]) [id number] Page 10 of 10 -------------------------------------------------------------------------------- Vitals Details Patient Name: Date of Service: Hailey Courser, DO [name] 07/04/2022 1:45 PM Medical Record Number: [id number] Patient Account Number:  [id number] Date of Birth/Sex: Treating RN: 05/12/1946 (76 y.o. F) Primary Care Austina Constantin: Lorene Dy Other Clinician: Referring Vianna Venezia: Treating Raegen Tarpley/Extender: Sabino Gasser in Treatment: 4 Vital Signs Time Taken: 01:37 Temperature (F): 98.7 Height (in): 61 Pulse (bpm): 121 Weight (lbs): 186 Respiratory Rate (breaths/min): 18 Body Mass Index (BMI): 35.1 Blood Pressure (mmHg): 160/95 Reference Range: 80 - 120 mg / dl Electronic Signature(s) Signed: 07/04/2022 4:30:07 PM By: Worthy Rancher Entered By: Worthy Rancher on 07/04/2022 13:37:41

## 2022-07-15 ENCOUNTER — Encounter (HOSPITAL_BASED_OUTPATIENT_CLINIC_OR_DEPARTMENT_OTHER): Payer: Medicare Other | Admitting: Internal Medicine

## 2022-07-15 DIAGNOSIS — M069 Rheumatoid arthritis, unspecified: Secondary | ICD-10-CM | POA: Diagnosis not present

## 2022-07-15 DIAGNOSIS — J849 Interstitial pulmonary disease, unspecified: Secondary | ICD-10-CM | POA: Diagnosis not present

## 2022-07-15 DIAGNOSIS — L97828 Non-pressure chronic ulcer of other part of left lower leg with other specified severity: Secondary | ICD-10-CM

## 2022-07-15 DIAGNOSIS — I87312 Chronic venous hypertension (idiopathic) with ulcer of left lower extremity: Secondary | ICD-10-CM

## 2022-07-15 NOTE — Progress Notes (Signed)
[name], [name] ([id number]) [id number] Page 1 of 10 Visit Report for 07/15/2022 Chief Complaint Document Details Patient Name: Date of Service: Hailey Courser, [name] 07/15/2022 11:30 A M Medical Record Number: [id number] Patient Account Number: [id number] Date of Birth/Sex: Treating RN: 12/16/[name] (75 y.o. F) Primary Care Provider: Lorene Dy Other Clinician: Referring Provider: Treating Provider/Extender: [name] in Treatment: 6 Information Obtained from: Patient Chief Complaint 06/03/2022; Left lower extremity wounds Electronic Signature(s) Signed: 07/15/2022 2:07:33 PM By: Kalman Shan Hailey Entered By: Kalman Shan on 07/15/2022 13:14:09 -------------------------------------------------------------------------------- HPI Details Patient Name: Date of Service: Hailey Courser, [name] 07/15/2022 11:30 A M Medical Record Number: [id number] Patient Account Number: [id number] Date of Birth/Sex: Treating RN: [birth year] (76 y.o. F) Primary Care Provider: Lorene Dy Other Clinician: Referring Provider: Treating Provider/Extender: [name] in Treatment: 6 History of Present Illness HPI Description: Patient presents today for initial evaluation and our clinic as a referral from the [facility] health system Department of orthopedics for evaluation and treatment of the wound to his his of the left foot at the base of the great toe. Currently the good news is the patient really does not have any significant pain which is excellent. She has been seen by Dr. Linus Salmons at [facility]: infectious disease clinic that is the regional Center for infectious disease. Subsequently the patient was cultured and positive for methicillin sensitive staph aureus. She has been on antibiotics including Cipro prior to surgery as will several days following surgery. She then had clindamycin which was changed to  doxycycline. She took this for 10 days. Subsequently the pain had improved and there was no longer any possible draining from the wound according to the notes. The patient sedentary rate as well as C-reactive protein have returned to normal ranges. Currently per Dr. Henreitta Leber assessment there was one dehiscence with no sign of osteomyelitis. He therefore discontinue the antibiotics with the completion of the last three days Of doxycycline. Other than that he just set her for a follow-up as needed. The patient does not have diabetes and is not a current smoker. At this time her treatment that was recommended by the surgeon was applying a Hydrocolloid dressing. Based on what I'm seeing at this point the patient actually has some Slough noted over the surface of the wound there really does not appear to be evidence of infection though I Hailey think she does require some sharp debridement to clear away the slough and help with appropriate wound healing. She does have areas of granulation buds noted. She may be a candidate for a skin substitute as well. We will see how things Hailey over the next period of time until follow-up. No fevers, chills, nausea, or vomiting noted at this time. 06/13/18 on evaluation today patient actually appears to be doing better in regard to her toe ulcer. She's been using the Prisma on this region and that seems to have done extremely well for her. Fortunately there does not appear to be evidence of infection at this time which is good news. She did see her surgeon they were extremely pleased with the overall appearance of her wound. 06/27/18 on evaluation today patient actually appears to be doing very well in regard to her foot ulcer. She has been tolerating the dressing changes without complication which is great news. She did see her podiatrist they felt like everything is looking very nice as well and will please. 07/05/2018 surgical wound on  the left foot. She appears to be doing  well. Still surface debrided to remove however in general post debridement the wound bed looks quite healthy it is come down significantly in terms of dimensions using silver collagen. The patient describes pain when her foot is elevated either in bed at night [sometimes keeps her awake] or when is propped up on a foot rest. She basically takes analgesics for this. She does not really describe claudication with activity however her activity is very limited by interstitial lung disease. Her ABIs initially in this clinic were noncompressible. She is not a diabetic 07/20/2018; surgical wound on the left foot. Wound actually looks quite a bit better than I remember seeing this. She is still describing pain with her leg elevated at night that she does not get when the wound is supine. She does not really describe claudication but she is very limited by her pulmonary status. We have been using silver collagen. 1/17; the patient has been followed up by orthopedics and discharge. Her arterial studies were actually quite good and should not be contributing to any pain. Slight reduction in ABIs but otherwise normal. Her wound is closed. She saw Dr. Linus Salmons of infectious disease surrounding the surgery. By review of our notes [name], [name] ([id number]) [id number] Page 2 of 10 she was not felt to have osteomyelitis. 06/03/2022 [name] is a 76 year old female with a past medical history of interstitial lung disease on chronic oxygen via nasal cannula, rheumatoid arthritis, chronic diastolic heart failure and OSA that presents to the clinic for a 1 to 63-monthhistory of nonhealing ulcer to the left lower extremity. On 04/20/2022 she was admitted to the hospital for left lower extremity cellulitis. She required 8 days of IV vancomycin and Unasyn followed by a 14-day course of Augmentin and doxycycline. She has been using Silvadene cream to the area. She does not use compression  therapy but does own compression stockings. She currently denies signs of infection. 11/27; patient presents for follow-up she is using Medihoney and Hydrofera Blue to the wound beds. She has been using her Tubigrip. She has no issues or complaints today. 12/4; patient presents for follow-up. She continues to use Medihoney and Hydrofera Blue to the wound beds. Several attempts have been made to schedule ABIs with TBI's. We gave patient the number today to call to have these scheduled. She has been using Tubigrip. She has no issues or complaints today. 12/18; she continues to use Medihoney and Hydrofera Blue in general the surface of the wound is looking somewhat better this week and measurements are slightly smaller. She did have her arterial studies done these were noncompressible bilaterally however TBI on the right of 0.69 on the left 0.62. Waveforms on the right were triphasic on the left biphasic to triphasic. This does not suggest significant arterial disease to make healing wounds at this location difficult. 12/29; patient presents for follow-up. She has been using Medihoney and Hydrofera Blue to the wound bed. She has been using Tubigrip. Electronic Signature(s) Signed: 07/15/2022 2:07:33 PM By: HKalman ShanDO Entered By: HKalman Shanon 07/15/2022 13:42:15 -------------------------------------------------------------------------------- Physical Exam Details Patient Name: Date of Service: Hailey Courser [name] 07/15/2022 11:30 A M Medical Record Number: [id number] Account Number: [id number] of Birth/Sex: Treating RN: [birth year](76y.o. F) Primary Care Provider: RLorene DyOther Clinician: Referring Provider: Treating Provider/Extender: HKrista Bluein Treatment: 6 Constitutional respirations regular, non-labored and within target range for patient..Marland Kitchen  Cardiovascular 2+ dorsalis pedis/posterior tibialis pulses. Psychiatric pleasant  and cooperative. Notes T the lateral aspect of the left lower extremity there are 2 open wounds with granulation tissue and scant non viable tissue. 2+ pitting edema to the knee. o Obvious deformity to the foot from previous surgeries. No signs of surrounding soft tissue infection. Electronic Signature(s) Signed: 07/15/2022 2:07:33 PM By: Kalman Shan Hailey Entered By: Kalman Shan on 07/15/2022 13:49:57 -------------------------------------------------------------------------------- Physician Orders Details Patient Name: Date of Service: Hailey Courser, [name] 07/15/2022 11:30 A M Medical Record Number: [id number] Patient Account Number: [id number] Date of Birth/Sex: Treating RN: [name][birth year] (76 y.o. [name], [name] Primary Care Provider: Lorene Dy Other Clinician: Referring Provider: Treating Provider/Extender: [name] in Treatment: 6 Verbal / Phone Orders: No [name], [name] ([id number]) [id number] Page 3 of 10 Diagnosis Coding ICD-10 Coding Code Description 6408834246 Non-pressure chronic ulcer of other part of left lower leg with other specified severity I87.312 Chronic venous hypertension (idiopathic) with ulcer of left lower extremity J84.9 Interstitial pulmonary disease, unspecified M06.9 Rheumatoid arthritis, unspecified Follow-up Appointments ppointment in 1 week. - Dr. Heber [place] Return A ppointment in 2 weeks. - Dr. Heber  Return A Anesthetic (In clinic) Topical Lidocaine 5% applied to wound bed Bathing/ Shower/ Hygiene May shower with protection but Hailey not get wound dressing(s) wet. Protect dressing(s) with water repellant cover (for example, large plastic bag) or a cast cover and may then take shower. Edema Control - Lymphedema / SCD / Other Elevate legs to the level of the heart or above for 30 minutes daily and/or when sitting for 3-4 times a day throughout the day. Avoid standing for long periods of  time. Exercise regularly Wound Treatment Wound #4 - Lower Leg Wound Laterality: Left, Anterior, Proximal Cleanser: Wound Cleanser (Generic) 1 x Per Day/30 Days Discharge Instructions: Cleanse the wound with wound cleanser prior to applying a clean dressing using gauze sponges, not tissue or cotton balls. Prim Dressing: MediHoney Gel, tube 1.5 (oz) 1 x Per Day/30 Days ary Discharge Instructions: Apply to wound bed as instructed Prim Dressing: Hydrofera Blue Ready Foam, 4x5 in (Generic) 1 x Per Day/30 Days ary Discharge Instructions: Apply to wound bed as instructed Secondary Dressing: ABD Pad, 8x10 1 x Per Day/30 Days Discharge Instructions: Apply over primary dressing as directed. Secured With: [employer], 4.5x3.1 (in/yd) (Generic) 1 x Per Day/30 Days Discharge Instructions: Secure with Kerlix as directed. Secured With: 64M Medipore H Soft Cloth Surgical T ape, 4 x 10 (in/yd) (Generic) 1 x Per Day/30 Days Discharge Instructions: Secure with tape as directed. Compression Wrap: TUBIGRIP SIZE D 1 x Per Day/30 Days Discharge Instructions: apply in the morning and remove at night. DOUBLE IT NOW!!!!! Wound #5 - Lower Leg Wound Laterality: Left, Anterior, Distal Cleanser: Wound Cleanser (Generic) 1 x Per Day/30 Days Discharge Instructions: Cleanse the wound with wound cleanser prior to applying a clean dressing using gauze sponges, not tissue or cotton balls. Prim Dressing: MediHoney Gel, tube 1.5 (oz) 1 x Per Day/30 Days ary Discharge Instructions: Apply to wound bed as instructed Prim Dressing: Hydrofera Blue Ready Foam, 4x5 in (Generic) 1 x Per Day/30 Days ary Discharge Instructions: Apply to wound bed as instructed Secondary Dressing: ABD Pad, 8x10 1 x Per Day/30 Days Discharge Instructions: Apply over primary dressing as directed. Secured With: [employer], 4.5x3.1 (in/yd) (Generic) 1 x Per Day/30 Days Discharge Instructions: Secure with Kerlix as directed. Secured  With: 64M Medipore H Public affairs consultant Surgical  T ape, 4 x 10 (in/yd) (Generic) 1 x Per Day/30 Days Discharge Instructions: Secure with tape as directed. Compression Wrap: TUBIGRIP SIZE D 1 x Per Day/30 Days Discharge Instructions: apply in the morning and remove at night. DOUBLE IT NOW!!!!! Consults Vascular - Vascular Consult with Dr. Gwenlyn Found due to the abnormal TBIs and ABIs [name], [name] ([id number]) [phone].pdf Page 4 of 10 Electronic Signature(s) Signed: 07/15/2022 2:07:33 PM By: Kalman Shan Hailey Entered By: Kalman Shan on 07/15/2022 14:03:30 Prescription 07/15/2022 -------------------------------------------------------------------------------- Luvenia Redden Hailey Patient Name: Provider: 1945-12-07 [id number] Date of Birth: NPI#Wanda Plump [id number] Sex: DEA #: [phone] Phone #: License #: [place] Patient Address: [address] [place] [address] [place], [place] [address] [phone] Allergies cefdinir; erythromycin base; lactulose; mesalamine; nitrofuran derivative; nitrofurantoin; lettuce; Sulfa (Sulfonamide Antibiotics); Adacel(Tdap Adolesn/Adult)(PF); tetanus toxoid, adsorbed Provider's Orders Vascular - Vascular Consult with Dr. Gwenlyn Found due to the abnormal TBIs and ABIs Hand Signature: Date(s): Electronic Signature(s) Signed: 07/15/2022 2:07:33 PM By: Kalman Shan Hailey Entered By: Kalman Shan on 07/15/2022 14:03:31 -------------------------------------------------------------------------------- Problem List Details Patient Name: Date of Service: Hailey Courser, [name] 07/15/2022 11:30 A M Medical Record Number: [id number] Patient Account Number: [id number] Date of Birth/Sex: Treating RN: [birth year] [name] (76 y.o. [name] Primary Care Provider: Lorene Dy Other Clinician: Referring Provider: Treating Provider/Extender:  [name] in Treatment: 6 Active Problems ICD-10 Encounter Code Description Active Date MDM Diagnosis L97.828 Non-pressure chronic ulcer of other part of left lower leg with other specified 06/03/2022 No Yes severity I87.312 Chronic venous hypertension (idiopathic) with ulcer of left lower extremity 06/03/2022 No Yes J84.9 Interstitial pulmonary disease, unspecified 06/03/2022 No Yes [name], [name] ([id number]) [phone].pdf Page 5 of 10 M06.9 Rheumatoid arthritis, unspecified 06/03/2022 No Yes Inactive Problems Resolved Problems Electronic Signature(s) Signed: 07/15/2022 2:07:33 PM By: Kalman Shan Hailey Entered By: Kalman Shan on 07/15/2022 13:13:36 -------------------------------------------------------------------------------- Progress Note Details Patient Name: Date of Service: Hailey Courser, [name] 07/15/2022 11:30 A M Medical Record Number: [id number] Patient Account Number: [id number] Date of Birth/Sex: Treating RN: [birth year]-[name] (76 y.o. F) Primary Care Provider: Lorene Dy Other Clinician: Referring Provider: Treating Provider/Extender: [name] in Treatment: 6 Subjective Chief Complaint Information obtained from Patient 06/03/2022; Left lower extremity wounds History of Present Illness (HPI) Patient presents today for initial evaluation and our clinic as a referral from the [facility] health system Department of orthopedics for evaluation and treatment of the wound to his his of the left foot at the base of the great toe. Currently the good news is the patient really does not have any significant pain which is excellent. She has been seen by Dr. Linus Salmons at [facility]: infectious disease clinic that is the regional Center for infectious disease. Subsequently the patient was cultured and positive for methicillin sensitive staph aureus. She has been on antibiotics including Cipro  prior to surgery as will several days following surgery. She then had clindamycin which was changed to doxycycline. She took this for 10 days. Subsequently the pain had improved and there was no longer any possible draining from the wound according to the notes. The patient sedentary rate as well as C-reactive protein have returned to normal ranges. Currently per Dr. Henreitta Leber assessment there was one dehiscence with no sign of osteomyelitis. He therefore discontinue the antibiotics with the completion of the last three days Of doxycycline. Other  than that he just set her for a follow-up as needed. The patient does not have diabetes and is not a current smoker. At this time her treatment that was recommended by the surgeon was applying a Hydrocolloid dressing. Based on what I'm seeing at this point the patient actually has some Slough noted over the surface of the wound there really does not appear to be evidence of infection though I Hailey think she does require some sharp debridement to clear away the slough and help with appropriate wound healing. She does have areas of granulation buds noted. She may be a candidate for a skin substitute as well. We will see how things Hailey over the next period of time until follow-up. No fevers, chills, nausea, or vomiting noted at this time. 06/13/18 on evaluation today patient actually appears to be doing better in regard to her toe ulcer. She's been using the Prisma on this region and that seems to have done extremely well for her. Fortunately there does not appear to be evidence of infection at this time which is good news. She did see her surgeon they were extremely pleased with the overall appearance of her wound. 06/27/18 on evaluation today patient actually appears to be doing very well in regard to her foot ulcer. She has been tolerating the dressing changes without complication which is great news. She did see her podiatrist they felt like everything is looking  very nice as well and will please. 07/05/2018 surgical wound on the left foot. She appears to be doing well. Still surface debrided to remove however in general post debridement the wound bed looks quite healthy it is come down significantly in terms of dimensions using silver collagen. The patient describes pain when her foot is elevated either in bed at night [sometimes keeps her awake] or when is propped up on a foot rest. She basically takes analgesics for this. She does not really describe claudication with activity however her activity is very limited by interstitial lung disease. Her ABIs initially in this clinic were noncompressible. She is not a diabetic 07/20/2018; surgical wound on the left foot. Wound actually looks quite a bit better than I remember seeing this. She is still describing pain with her leg elevated at night that she does not get when the wound is supine. She does not really describe claudication but she is very limited by her pulmonary status. We have been using silver collagen. 1/17; the patient has been followed up by orthopedics and discharge. Her arterial studies were actually quite good and should not be contributing to any pain. Slight reduction in ABIs but otherwise normal. Her wound is closed. She saw Dr. Linus Salmons of infectious disease surrounding the surgery. By review of our notes she was not felt to have osteomyelitis. 06/03/2022 Ms. Bernadetta Roell is a 76 year old female with a past medical history of interstitial lung disease on chronic oxygen via nasal cannula, rheumatoid arthritis, chronic diastolic heart failure and OSA that presents to the clinic for a 1 to 57-monthhistory of nonhealing ulcer to the left lower extremity. On 04/20/2022 she was admitted to the hospital for left lower extremity cellulitis. She required 8 days of IV vancomycin and Unasyn followed by a 14-day course of Augmentin and doxycycline. She has been using Silvadene cream to the area. She does  not use compression therapy but does own compression stockings. She currently denies signs of infection. 11/27; patient presents for follow-up she is using Medihoney and Hydrofera Blue to the wound  beds. She has been using her Tubigrip. She has no issues or complaints today. 12/4; patient presents for follow-up. She continues to use Medihoney and Hydrofera Blue to the wound beds. Several attempts have been made to schedule CARILYN, [place] ([id number]) [phone].pdf Page 6 of 10 ABIs with TBI's. We gave patient the number today to call to have these scheduled. She has been using Tubigrip. She has no issues or complaints today. 12/18; she continues to use Medihoney and Hydrofera Blue in general the surface of the wound is looking somewhat better this week and measurements are slightly smaller. She did have her arterial studies done these were noncompressible bilaterally however TBI on the right of 0.69 on the left 0.62. Waveforms on the right were triphasic on the left biphasic to triphasic. This does not suggest significant arterial disease to make healing wounds at this location difficult. 12/29; patient presents for follow-up. She has been using Medihoney and Hydrofera Blue to the wound bed. She has been using Tubigrip. Patient History Information obtained from Patient. Family History Cancer - Maternal Grandparents, Diabetes - Siblings, Heart Disease - Maternal Grandparents,Paternal Grandparents,Mother,Father,Siblings, Hypertension - Mother,Father,Siblings, Stroke - Paternal Grandparents, No family history of Hereditary Spherocytosis, Kidney Disease, Lung Disease, Seizures, Thyroid Problems, Tuberculosis. Social History Never smoker, Marital Status - Married, Alcohol Use - Moderate, Drug Use - No History, Caffeine Use - Moderate. Medical History Eyes Denies history of Cataracts, Optic Neuritis Ear/Nose/Mouth/Throat Denies history of Chronic sinus  problems/congestion Hematologic/Lymphatic Denies history of Anemia, Hemophilia, Human Immunodeficiency Virus, Lymphedema, Sickle Cell Disease Respiratory Patient has history of Asthma, Sleep Apnea Denies history of Aspiration, Chronic Obstructive Pulmonary Disease (COPD), Pneumothorax, Tuberculosis Cardiovascular Patient has history of Hypertension Denies history of Angina, Arrhythmia, Congestive Heart Failure, Coronary Artery Disease, Deep Vein Thrombosis, Hypotension, Myocardial Infarction, Peripheral Arterial Disease, Peripheral Venous Disease, Phlebitis, Vasculitis Gastrointestinal Patient has history of Colitis Denies history of Cirrhosis , Crohnoos, Hepatitis A, Hepatitis B, Hepatitis C Endocrine Denies history of Type I Diabetes, Type II Diabetes Immunological Denies history of Lupus Erythematosus, Raynaudoos, Scleroderma Integumentary (Skin) Denies history of History of Burn Musculoskeletal Patient has history of Rheumatoid Arthritis, Osteoarthritis Denies history of Gout, Osteomyelitis Neurologic Denies history of Dementia, Neuropathy, Quadriplegia, Paraplegia, Seizure Disorder Oncologic Denies history of Received Chemotherapy, Received Radiation Psychiatric Denies history of Anorexia/bulimia, Confinement Anxiety Hospitalization/Surgery History - surgery toe. - right hip replacement 12/2018. - heart cath 12/17/2021. Medical A Surgical History Notes nd Respiratory 4L [place] 02 pulmonary fibrosis Objective Constitutional respirations regular, non-labored and within target range for patient.. Vitals Time Taken: 11:45 AM, Height: 61 in, Weight: 186 lbs, BMI: 35.1, Temperature: 98.2 F, Pulse: 109 bpm, Respiratory Rate: 20 breaths/min, Blood Pressure: 148/73 mmHg. Cardiovascular 2+ dorsalis pedis/posterior tibialis pulses. Psychiatric pleasant and cooperative. General Notes: T the lateral aspect of the left lower extremity there are 2 open wounds with granulation tissue and  scant non viable tissue. 2+ pitting edema to o the knee. Obvious deformity to the foot from previous surgeries. No signs of surrounding soft tissue infection. Integumentary (Hair, Skin) Wound #4 status is Open. Original cause of wound was Gradually Appeared. The date acquired was: 04/19/2022. The wound has been in treatment 6 weeks. The wound is located on the Left,Proximal,Anterior Lower Leg. The wound measures 1.3cm length x 1.5cm width x 0.2cm depth; 1.532cm^2 area and 0.306cm^3 [name], [name] ([id number]) [phone].pdf Page 7 of 10 volume. There is Fat Layer (Subcutaneous Tissue) exposed. There is no tunneling or undermining noted. There is a medium amount of  serosanguineous drainage noted. The wound margin is distinct with the outline attached to the wound base. There is large (67-100%) pink, pale granulation within the wound bed. There is a small (1-33%) amount of necrotic tissue within the wound bed. The periwound skin appearance did not exhibit: Callus, Crepitus, Excoriation, Induration, Rash, Scarring, Dry/Scaly, Maceration, Atrophie Blanche, Cyanosis, Ecchymosis, Hemosiderin Staining, Mottled, Pallor, Rubor, Erythema. Periwound temperature was noted as No Abnormality. The periwound has tenderness on palpation. Wound #5 status is Open. Original cause of wound was Gradually Appeared. The date acquired was: 04/19/2022. The wound has been in treatment 6 weeks. The wound is located on the [facility] Lower Leg. The wound measures 5.6cm length x 4cm width x 0.2cm depth; 17.593cm^2 area and 3.519cm^3 volume. There is tendon and Fat Layer (Subcutaneous Tissue) exposed. There is no tunneling or undermining noted. There is a medium amount of serosanguineous drainage noted. The wound margin is distinct with the outline attached to the wound base. There is large (67-100%) pink, pale granulation within the wound bed. There is a small (1-33%) amount of necrotic tissue  within the wound bed including Adherent Slough. The periwound skin appearance did not exhibit: Callus, Crepitus, Excoriation, Induration, Rash, Scarring, Dry/Scaly, Maceration, Atrophie Blanche, Cyanosis, Ecchymosis, Hemosiderin Staining, Mottled, Pallor, Rubor, Erythema. Periwound temperature was noted as No Abnormality. The periwound has tenderness on palpation. Assessment Active Problems ICD-10 Non-pressure chronic ulcer of other part of left lower leg with other specified severity Chronic venous hypertension (idiopathic) with ulcer of left lower extremity Interstitial pulmonary disease, unspecified Rheumatoid arthritis, unspecified Patient's wounds are stable. There is more granulation tissue present. I recommended continue Medihoney and Hydrofera Blue. Will refer to Dr. Gwenlyn Found for abnormal ABIs with TBI's to assure that she has adequate blood flow for wound healing. Currently she is using Tubigrip. Will increase this compression. She can Hailey this daily. Follow-up in 1 week. Plan Follow-up Appointments: Return Appointment in 1 week. - Dr. Heber Bodega Return Appointment in 2 weeks. - Dr. Heber [place] Anesthetic: (In clinic) Topical Lidocaine 5% applied to wound bed Bathing/ Shower/ Hygiene: May shower with protection but Hailey not get wound dressing(s) wet. Protect dressing(s) with water repellant cover (for example, large plastic bag) or a cast cover and may then take shower. Edema Control - Lymphedema / SCD / Other: Elevate legs to the level of the heart or above for 30 minutes daily and/or when sitting for 3-4 times a day throughout the day. Avoid standing for long periods of time. Exercise regularly Consults ordered were: Vascular - Vascular Consult with Dr. Gwenlyn Found due to the abnormal TBIs and ABIs WOUND #4: - Lower Leg Wound Laterality: Left, Anterior, Proximal Cleanser: Wound Cleanser (Generic) 1 x Per Day/30 Days Discharge Instructions: Cleanse the wound with wound cleanser prior to applying  a clean dressing using gauze sponges, not tissue or cotton balls. Prim Dressing: MediHoney Gel, tube 1.5 (oz) 1 x Per Day/30 Days ary Discharge Instructions: Apply to wound bed as instructed Prim Dressing: Hydrofera Blue Ready Foam, 4x5 in (Generic) 1 x Per Day/30 Days ary Discharge Instructions: Apply to wound bed as instructed Secondary Dressing: ABD Pad, 8x10 1 x Per Day/30 Days Discharge Instructions: Apply over primary dressing as directed. Secured With: [employer], 4.5x3.1 (in/yd) (Generic) 1 x Per Day/30 Days Discharge Instructions: Secure with Kerlix as directed. Secured With: 18M Medipore H Soft Cloth Surgical T ape, 4 x 10 (in/yd) (Generic) 1 x Per Day/30 Days Discharge Instructions: Secure with tape as directed. Com pression Wrap: TUBIGRIP  SIZE D 1 x Per Day/30 Days Discharge Instructions: apply in the morning and remove at night. DOUBLE IT NOW!!!!! WOUND #5: - Lower Leg Wound Laterality: Left, Anterior, Distal Cleanser: Wound Cleanser (Generic) 1 x Per Day/30 Days Discharge Instructions: Cleanse the wound with wound cleanser prior to applying a clean dressing using gauze sponges, not tissue or cotton balls. Prim Dressing: MediHoney Gel, tube 1.5 (oz) 1 x Per Day/30 Days ary Discharge Instructions: Apply to wound bed as instructed Prim Dressing: Hydrofera Blue Ready Foam, 4x5 in (Generic) 1 x Per Day/30 Days ary Discharge Instructions: Apply to wound bed as instructed Secondary Dressing: ABD Pad, 8x10 1 x Per Day/30 Days Discharge Instructions: Apply over primary dressing as directed. Secured With: [employer], 4.5x3.1 (in/yd) (Generic) 1 x Per Day/30 Days Discharge Instructions: Secure with Kerlix as directed. Secured With: 60M Medipore H Soft Cloth Surgical T ape, 4 x 10 (in/yd) (Generic) 1 x Per Day/30 Days Discharge Instructions: Secure with tape as directed. Com pression Wrap: TUBIGRIP SIZE D 1 x Per Day/30 Days Discharge Instructions: apply in the  morning and remove at night. DOUBLE IT NOW!!!!! 1. Hydrofera Blue with Medihoney under Tubigrip 2. Follow-up in 1 week SHENIAH, SUPAK ([id number]) [phone].pdf Page 8 of 10 Electronic Signature(s) Signed: 07/15/2022 2:07:33 PM By: Kalman Shan Hailey Entered By: Kalman Shan on 07/15/2022 14:05:44 -------------------------------------------------------------------------------- HxROS Details Patient Name: Date of Service: Hailey Courser, [name] 07/15/2022 11:30 A M Medical Record Number: [id number] Patient Account Number: [id number] Date of Birth/Sex: Treating RN: [name], [name] (76 y.o. F) Primary Care Provider: Lorene Dy Other Clinician: Referring Provider: Treating Provider/Extender: [name] in Treatment: 6 Information Obtained From Patient Eyes Medical History: Negative for: Cataracts; Optic Neuritis Ear/Nose/Mouth/Throat Medical History: Negative for: Chronic sinus problems/congestion Hematologic/Lymphatic Medical History: Negative for: Anemia; Hemophilia; Human Immunodeficiency Virus; Lymphedema; Sickle Cell Disease Respiratory Medical History: Positive for: Asthma; Sleep Apnea Negative for: Aspiration; Chronic Obstructive Pulmonary Disease (COPD); Pneumothorax; Tuberculosis Past Medical History Notes: 4L [place] 02 pulmonary fibrosis Cardiovascular Medical History: Positive for: Hypertension Negative for: Angina; Arrhythmia; Congestive Heart Failure; Coronary Artery Disease; Deep Vein Thrombosis; Hypotension; Myocardial Infarction; Peripheral Arterial Disease; Peripheral Venous Disease; Phlebitis; Vasculitis Gastrointestinal Medical History: Positive for: Colitis Negative for: Cirrhosis ; Crohns; Hepatitis A; Hepatitis B; Hepatitis C Endocrine Medical History: Negative for: Type I Diabetes; Type II Diabetes Immunological Medical History: Negative for: Lupus Erythematosus; Raynauds;  Scleroderma Integumentary (Skin) Medical History: Negative for: History of Burn TRANG, BOUSE ([id number]) [phone].pdf Page 9 of 10 Musculoskeletal Medical History: Positive for: Rheumatoid Arthritis; Osteoarthritis Negative for: Gout; Osteomyelitis Neurologic Medical History: Negative for: Dementia; Neuropathy; Quadriplegia; Paraplegia; Seizure Disorder Oncologic Medical History: Negative for: Received Chemotherapy; Received Radiation Psychiatric Medical History: Negative for: Anorexia/bulimia; Confinement Anxiety Immunizations Pneumococcal Vaccine: Received Pneumococcal Vaccination: Yes Received Pneumococcal Vaccination On or After 60th Birthday: Yes Implantable Devices No devices added Hospitalization / Surgery History Type of Hospitalization/Surgery surgery toe right hip replacement 12/2018 heart cath 12/17/2021 Family and Social History Cancer: Yes - Maternal Grandparents; Diabetes: Yes - Siblings; Heart Disease: Yes - Maternal Grandparents,Paternal Grandparents,Mother,Father,Siblings; Hereditary Spherocytosis: No; Hypertension: Yes - Mother,Father,Siblings; Kidney Disease: No; Lung Disease: No; Seizures: No; Stroke: Yes - Paternal Grandparents; Thyroid Problems: No; Tuberculosis: No; Never smoker; Marital Status - Married; Alcohol Use: Moderate; Drug Use: No History; Caffeine Use: Moderate; Financial Concerns: No; Food, Clothing or Shelter Needs: No; Support System Lacking: No; Transportation Concerns: No Electronic Signature(s) Signed: 07/15/2022 2:07:33 PM By: Kalman Shan Hailey Entered  By: Kalman Shan on 07/15/2022 13:42:20 -------------------------------------------------------------------------------- SuperBill Details Patient Name: Date of Service: [name], [name] 07/15/2022 Medical Record Number: [id number] Patient Account Number: [id number] Date of Birth/Sex: Treating RN: [name][birth year] (76 y.o. [name] Primary Care  Provider: Lorene Dy Other Clinician: Referring Provider: Treating Provider/Extender: [name] in Treatment: 6 Diagnosis Coding ICD-10 Codes Code Description [phone] Non-pressure chronic ulcer of other part of left lower leg with other specified severity I87.312 Chronic venous hypertension (idiopathic) with ulcer of left lower extremity J84.9 Interstitial pulmonary disease, unspecified M06.9 Rheumatoid arthritis, unspecified Facility Procedures : STEPHENE, ALEGRIA CodeGarret Reddish ([id number]) [phone] Description: [id number] - WOUND CARE VISIT-LEV 4 EST PT Modifier: [email or web address] 1 Quantity: df Page 10 of 10 Physician Procedures : CPT4 Code Description Modifier [id number] - WC PHYS LEVEL 3 - EST PT ICD-10 Diagnosis Description L97.828 Non-pressure chronic ulcer of other part of left lower leg with other specified severity I87.312 Chronic venous hypertension (idiopathic) with  ulcer of left lower extremity J84.9 Interstitial pulmonary disease, unspecified M06.9 Rheumatoid arthritis, unspecified Quantity: 1 Electronic Signature(s) Signed: 07/15/2022 2:07:33 PM By: Kalman Shan Hailey Entered By: Kalman Shan on 07/15/2022 14:06:03

## 2022-07-15 NOTE — Progress Notes (Signed)
TWINKLE, SOCKWELL ([id number]) [id number] Page 1 of 11 Visit Report for 07/15/2022 Arrival Information Details Patient Name: Date of Service: Hailey Courser, DO [name] 07/15/2022 11:30 A M Medical Record Number: [id number] Patient Account Number: [id number] Date of Birth/Sex: Treating RN: [birth year] (76 y.o. [name], [name] Primary Care Marysa Wessner: Lorene Dy Other Clinician: Referring Derek Huneycutt: Treating Kathalene Sporer/Extender: [name] in Treatment: 6 Visit Information History Since Last Visit Added or deleted any medications: No Patient Arrived: Wheel Chair Any new allergies or adverse reactions: No Arrival Time: 11:45 Had a fall or experienced change in No Accompanied By: husband activities of daily living that may affect Transfer Assistance: Manual risk of falls: Patient Identification Verified: Yes Signs or symptoms of abuse/neglect since last visito No Secondary Verification Process Completed: Yes Hospitalized since last visit: No Patient Requires Transmission-Based Precautions: No Implantable device outside of the clinic excluding No Patient Has Alerts: Yes cellular tissue based products placed in the center Patient Alerts: ABI's: L: N/C 11/23 since last visit: Has Dressing in Place as Prescribed: Yes Has Compression in Place as Prescribed: Yes Pain Present Now: No Electronic Signature(s) Signed: 07/15/2022 3:41:35 PM By: Deon Pilling RN, BSN Entered By: Deon Pilling on 07/15/2022 11:50:27 -------------------------------------------------------------------------------- Clinic Level of Care Assessment Details Patient Name: Date of Service: [name], DO [name] 07/15/2022 11:30 A M Medical Record Number: [id number] Patient Account Number: [id number] Date of Birth/Sex: Treating RN: Jul 26, 1945 (76 y.o. [name] Primary Care Mavis Gravelle: Lorene Dy Other Clinician: Referring Staton Markey: Treating Felice Deem/Extender:  [name] in Treatment: 6 Clinic Level of Care Assessment Items TOOL 4 Quantity Score X- 1 0 Use when only an EandM is performed on FOLLOW-UP visit ASSESSMENTS - Nursing Assessment / Reassessment X- 1 10 Reassessment of Co-morbidities (includes updates in patient status) X- 1 5 Reassessment of Adherence to Treatment Plan ASSESSMENTS - Wound and Skin A ssessment / Reassessment _0  - 0 Simple Wound Assessment / Reassessment - one wound X- 2 5 Complex Wound Assessment / Reassessment - multiple wounds X- 1 10 Dermatologic / Skin Assessment (not related to wound area) ASSESSMENTS - Focused Assessment X- 1 5 Circumferential Edema Measurements - multi extremities X- 1 10 Nutritional Assessment / Counseling / Intervention [name], [name] ([id number]) [id number] Page 2 of 11 _1  - 0 Lower Extremity Assessment (monofilament, tuning fork, pulses) _2  - 0 Peripheral Arterial Disease Assessment (using hand held doppler) ASSESSMENTS - Ostomy and/or Continence Assessment and Care _3  - 0 Incontinence Assessment and Management _4  - 0 Ostomy Care Assessment and Management (repouching, etc.) PROCESS - Coordination of Care _5  - 0 Simple Patient / Family Education for ongoing care X- 1 20 Complex (extensive) Patient / Family Education for ongoing care X- 1 10 Staff obtains Programmer, systems, Records, T Results / Process Orders est _6  - 0 Staff telephones HHA, Nursing Homes / Clarify orders / etc _7  - 0 Routine Transfer to another Facility (non-emergent condition) _8  - 0 Routine Hospital Admission (non-emergent condition) _9  - 0 New Admissions / Biomedical engineer / Ordering NPWT Apligraf, etc. , _10  - 0 Emergency Hospital Admission (emergent condition) _11  - 0 Simple Discharge Coordination X- 1 15 Complex (extensive) Discharge Coordination PROCESS - Special Needs _12  - 0 Pediatric / Minor Patient Management _13  - 0 Isolation Patient  Management _14  - 0 Hearing / Language / Visual special needs _15  - 0 Assessment of Community assistance (transportation, D/C planning, etc.) _16  - 0 Additional assistance / Altered mentation _17  -  0 Support Surface(s) Assessment (bed, cushion, seat, etc.) INTERVENTIONS - Wound Cleansing / Measurement _0  - 0 Simple Wound Cleansing - one wound X- 2 5 Complex Wound Cleansing - multiple wounds X- 1 5 Wound Imaging (photographs - any number of wounds) _1  - 0 Wound Tracing (instead of photographs) _2  - 0 Simple Wound Measurement - one wound X- 2 5 Complex Wound Measurement - multiple wounds INTERVENTIONS - Wound Dressings _3  - 0 Small Wound Dressing one or multiple wounds X- 1 15 Medium Wound Dressing one or multiple wounds _4  - 0 Large Wound Dressing one or multiple wounds <ZOXWRUEAVWUJWJXB>_1<\/YNWGNFAOZHYQMVHQ>_4  - 0 Application of Medications - topical <ONGEXBMWUXLKGMWN>_0<\/UVOZDGUYQIHKVQQV>_9  - 0 Application of Medications - injection INTERVENTIONS - Miscellaneous _7  - 0 External ear exam _8  - 0 Specimen Collection (cultures, biopsies, blood, body fluids, etc.) _9  - 0 Specimen(s) / Culture(s) sent or taken to Lab for analysis _10  - 0 Patient Transfer (multiple staff / Civil Service fast streamer / Similar devices) _11  - 0 Simple Staple / Suture removal (25 or less) _12  - 0 Complex Staple / Suture removal (26 or more) _13  - 0 Hypo / Hyperglycemic Management (close monitor of Blood Glucose) ARDYN, FORGE ([id number]) [id number] Page 3 of 11 _14  - 0 Ankle / Brachial Index (ABI) - do not check if billed separately X- 1 5 Vital Signs Has the patient been seen at the hospital within the last three years: Yes Total Score: 140 Level Of Care: New/Established - Level 4 Electronic Signature(s) Signed: 07/15/2022 3:41:35 PM By: Deon Pilling RN, BSN Entered By: Deon Pilling on 07/15/2022 12:24:14 -------------------------------------------------------------------------------- Encounter Discharge Information Details Patient Name: Date of  Service: Hailey Courser, DO [name] 07/15/2022 11:30 A M Medical Record Number: [id number] Patient Account Number: [id number] Date of Birth/Sex: Treating RN: 09/29/1945 (76 y.o. [name], [name] Primary Care Jasara Corrigan: Lorene Dy Other Clinician: Referring Shaniyah Wix: Treating Thien Berka/Extender: [name] in Treatment: 6 Encounter Discharge Information Items Discharge Condition: Stable Ambulatory Status: Wheelchair Discharge Destination: Home Transportation: Private Auto Accompanied By: spouse Schedule Follow-up Appointment: Yes Clinical Summary of Care: Electronic Signature(s) Signed: 07/15/2022 3:41:35 PM By: Deon Pilling RN, BSN Entered By: Deon Pilling on 07/15/2022 12:24:50 -------------------------------------------------------------------------------- Lower Extremity Assessment Details Patient Name: Date of Service: [name], DO [name] 07/15/2022 11:30 A M Medical Record Number: [id number] Patient Account Number: [id number] Date of Birth/Sex: Treating RN: [birth year] (76 y.o. [name], [name] Primary Care Kory Rains: Lorene Dy Other Clinician: Referring Kelwin Gibler: Treating Elan Brainerd/Extender: [name] in Treatment: 6 Edema Assessment Assessed: [name]: Yes] [Right: No] Edema: [Left: N] [Right: o] Calf Left: Right: Point of Measurement: 29 cm From Medial Instep 29 cm Ankle Left: Right: Point of Measurement: 9 cm From Medial Instep 23 cm Vascular Assessment Left: [id number] Page 4 of 11Right:] Pulses: Dorsalis Pedis Palpable: [id number] Page 4 of 11Yes] Electronic Signature(s) Signed: 07/15/2022 3:41:35 PM By: Deon Pilling RN, BSN Entered By: Deon Pilling on 07/15/2022 11:50:59 -------------------------------------------------------------------------------- Multi Wound Chart Details Patient Name: Date of Service: Hailey Courser, DO [name] 07/15/2022 11:30  A M Medical Record Number: [id number] Patient Account Number: [id number] Date of Birth/Sex: Treating RN: 1946/06/23 (76 y.o. F) Primary Care Arpi Diebold: Lorene Dy Other Clinician: Referring Nevaen Tredway: Treating Arfa Lamarca/Extender: [name] in Treatment: 6 Vital Signs Height(in): 61 Pulse(bpm): 109 Weight(lbs): 186 Blood Pressure(mmHg): 148/73 Body Mass Index(BMI): 35.1 Temperature(F): 98.2 Respiratory Rate(breaths/min): 20 [4:Photos:] [N/A:N/A] Left, Proximal, Anterior Lower Leg Left, Distal, Anterior Lower Leg N/A Wound Location:  Gradually Appeared Gradually Appeared N/A Wounding Event: Cellulitis Cellulitis N/A Primary Etiology: Asthma, Sleep Apnea, Hypertension, Asthma, Sleep Apnea, Hypertension, N/A Comorbid History: Colitis, Rheumatoid Arthritis, Colitis, Rheumatoid Arthritis, Osteoarthritis Osteoarthritis 04/19/2022 04/19/2022 N/A Date Acquired: 6 6 N/A Weeks of Treatment: Open Open N/A Wound Status: No No N/A Wound Recurrence: 1.3x1.5x0.2 5.6x4x0.2 N/A Measurements L x W x D (cm) 1.532 17.593 N/A A (cm) : rea 0.306 3.519 N/A Volume (cm) : 63.90% -13.10% N/A % Reduction in Area: 75.90% -126.30% N/A % Reduction in Volume: Full Thickness With Exposed Support Full Thickness With Exposed Support N/A Classification: Structures Structures Medium Medium N/A Exudate Amount: Serosanguineous Serosanguineous N/A Exudate Type: red, brown red, brown N/A Exudate Color: Distinct, outline attached Distinct, outline attached N/A Wound Margin: Large (67-100%) Large (67-100%) N/A Granulation Amount: Pink, Pale Pink, Pale N/A Granulation Quality: Small (1-33%) Small (1-33%) N/A Necrotic Amount: Fat Layer (Subcutaneous Tissue): Yes Fat Layer (Subcutaneous Tissue): Yes N/A Exposed Structures: Fascia: No Tendon: Yes Tendon: No Fascia: No Muscle: No Muscle: No Joint: No Joint: No Bone: No Bone: No Small (1-33%) Small (1-33%)  N/A Epithelialization: Excoriation: No Excoriation: No N/A Periwound Skin Texture: Induration: No Induration: No Callus: No Callus: No DENEKA, GREENWALT ([id number]) [phone].pdf Page 5 of 11 Crepitus: No Crepitus: No Rash: No Rash: No Scarring: No Scarring: No Maceration: No Maceration: No N/A Periwound Skin Moisture: Dry/Scaly: No Dry/Scaly: No Atrophie Blanche: No Atrophie Blanche: No N/A Periwound Skin Color: Cyanosis: No Cyanosis: No Ecchymosis: No Ecchymosis: No Erythema: No Erythema: No Hemosiderin Staining: No Hemosiderin Staining: No Mottled: No Mottled: No Pallor: No Pallor: No Rubor: No Rubor: No No Abnormality No Abnormality N/A Temperature: Yes Yes N/A Tenderness on Palpation: Treatment Notes Wound #4 (Lower Leg) Wound Laterality: Left, Anterior, Proximal Cleanser Wound Cleanser Discharge Instruction: Cleanse the wound with wound cleanser prior to applying a clean dressing using gauze sponges, not tissue or cotton balls. Peri-Wound Care Topical Primary Dressing MediHoney Gel, tube 1.5 (oz) Discharge Instruction: Apply to wound bed as instructed Hydrofera Blue Ready Foam, 4x5 in Discharge Instruction: Apply to wound bed as instructed Secondary Dressing ABD Pad, 8x10 Discharge Instruction: Apply over primary dressing as directed. Secured With [employer], 4.5x3.1 (in/yd) Discharge Instruction: Secure with Kerlix as directed. 69M Medipore H Soft Cloth Surgical T ape, 4 x 10 (in/yd) Discharge Instruction: Secure with tape as directed. Compression Wrap TUBIGRIP SIZE D Discharge Instruction: apply in the morning and remove at night. DOUBLE IT NOW!!!!! Compression Stockings Add-Ons Wound #5 (Lower Leg) Wound Laterality: Left, Anterior, Distal Cleanser Wound Cleanser Discharge Instruction: Cleanse the wound with wound cleanser prior to applying a clean dressing using gauze sponges, not tissue or cotton  balls. Peri-Wound Care Topical Primary Dressing MediHoney Gel, tube 1.5 (oz) Discharge Instruction: Apply to wound bed as instructed Hydrofera Blue Ready Foam, 4x5 in Discharge Instruction: Apply to wound bed as instructed Secondary Dressing ABD Pad, 8x10 Discharge Instruction: Apply over primary dressing as directed. Secured With [employer], 4.5x3.1 (in/yd) Discharge Instruction: Secure with Kerlix as directed. 69M Medipore H Soft Cloth Surgical T ape, 4 x 10 (in/yd) Discharge Instruction: Secure with tape as directed. KENEDI, CILIA ([id number]) [id number] Page 6 of 11 Compression Wrap TUBIGRIP SIZE D Discharge Instruction: apply in the morning and remove at night. DOUBLE IT NOW!!!!! Compression Stockings Add-Ons Electronic Signature(s) Signed: 07/15/2022 2:07:33 PM By: Kalman Shan DO Entered By: Kalman Shan on 07/15/2022 13:14:02 -------------------------------------------------------------------------------- Multi-Disciplinary Care Plan Details Patient Name: Date of Service: CO O K, DO  [name] 07/15/2022 11:30 A M Medical Record Number: [id number] Patient Account Number: [id number] Date of Birth/Sex: Treating RN: 05/14/1946 (76 y.o. [name], [name] Primary Care Doak Mah: Lorene Dy Other Clinician: Referring Thomas Mabry: Treating Cierrah Dace/Extender: [name] in Treatment: 6 Active Inactive Pain, Acute or Chronic Nursing Diagnoses: Pain, acute or chronic: actual or potential Potential alteration in comfort, pain Goals: Patient will verbalize adequate pain control and receive pain control interventions during procedures as needed Date Initiated: 06/03/2022 Target Resolution Date: 09/15/2022 Goal Status: Active Patient/caregiver will verbalize comfort level met Date Initiated: 06/03/2022 Target Resolution Date: 09/15/2022 Goal Status: Active Interventions: Encourage patient to take pain  medications as prescribed Provide education on pain management Treatment Activities: Administer pain control measures as ordered : 06/03/2022 Notes: Venous Leg Ulcer Nursing Diagnoses: Potential for venous Insuffiency (use before diagnosis confirmed) Goals: Patient will maintain optimal edema control Date Initiated: 06/03/2022 Target Resolution Date: 09/15/2022 Goal Status: Active Interventions: Assess peripheral edema status every visit. Compression as ordered Provide education on venous insufficiency Treatment Activities: Therapeutic compression applied : 06/03/2022 ALLANNAH, KEMPEN ([id number]) [id number] Page 7 of 11 Notes: Electronic Signature(s) Signed: 07/15/2022 3:41:35 PM By: Deon Pilling RN, BSN Entered By: Deon Pilling on 07/15/2022 12:20:53 -------------------------------------------------------------------------------- Pain Assessment Details Patient Name: Date of Service: Hailey Courser, DO [name] 07/15/2022 11:30 A M Medical Record Number: [id number] Patient Account Number: [id number] Date of Birth/Sex: Treating RN: 09-17-1945 (76 y.o. [name] Primary Care Oliana Gowens: Lorene Dy Other Clinician: Referring Macoy Rodwell: Treating Megen Madewell/Extender: [name] in Treatment: 6 Active Problems Location of Pain Severity and Description of Pain Patient Has Paino No Site Locations Rate the pain. Current Pain Level: 0 Pain Management and Medication Current Pain Management: Medication: No Cold Application: No Rest: No Massage: No Activity: No T.E.N.S.: No Heat Application: No Leg drop or elevation: No Is the Current Pain Management Adequate: Adequate How does your wound impact your activities of daily livingo Sleep: No Bathing: No Appetite: No Relationship With Others: No Bladder Continence: No Emotions: No Bowel Continence: No Work: No Toileting: No Drive: No Dressing: No Hobbies:  No Electronic Signature(s) Signed: 07/15/2022 3:41:35 PM By: Deon Pilling RN, BSN Entered By: Deon Pilling on 07/15/2022 11:50:47 [name] ([id number]) [id number] Page 8 of 11 -------------------------------------------------------------------------------- Patient/Caregiver Education Details Patient Name: Date of Service: [name], DO RO Marry Guan 12/29/2023andnbsp11:30 A M Medical Record Number: [id number] Patient Account Number: [id number] Date of Birth/Gender: Treating RN: 02-Apr-1946 (76 y.o. [name] Primary Care Physician: Lorene Dy Other Clinician: Referring Physician: Treating Physician/Extender: [name] in Treatment: 6 Education Assessment Education Provided To: Patient Education Topics Provided Wound/Skin Impairment: Handouts: Caring for Your Ulcer Methods: Explain/Verbal Responses: Reinforcements needed Electronic Signature(s) Signed: 07/15/2022 3:41:35 PM By: Deon Pilling RN, BSN Entered By: Deon Pilling on 07/15/2022 12:21:06 -------------------------------------------------------------------------------- Wound Assessment Details Patient Name: Date of Service: Hailey Courser, DO [name] 07/15/2022 11:30 A M Medical Record Number: [id number] Patient Account Number: [id number] Date of Birth/Sex: Treating RN: 1945/12/03 (76 y.o. [name], [name] Primary Care Ryon Layton: Lorene Dy Other Clinician: Referring Scarlette Hogston: Treating Tyrelle Raczka/Extender: [name] in Treatment: 6 Wound Status Wound Number: 4 Primary Cellulitis Etiology: Wound Location: Left, Proximal, Anterior Lower Leg Wound Open Wounding Event: Gradually Appeared Status: Date Acquired: 04/19/2022 Comorbid Asthma, Sleep Apnea, Hypertension, Colitis, Rheumatoid Weeks Of Treatment: 6 History: Arthritis, Osteoarthritis Clustered Wound: No Photos Wound Measurements Length: (cm) 1.3 Width: (cm)  1.5 Depth: (cm) 0.2 Area: (cm) 1.532 Volume: (cm) 0.306 % Reduction in Area: 63.9% % Reduction in Volume: 75.9% Epithelialization: Small (1-33%) Tunneling: No Undermining: No Wound Description ERYCA, BOLTE ([id number]) Classification: Full Thickness With Exposed Support Structures Wound Margin: Distinct, outline attached Exudate Amount: Medium Exudate Type: Serosanguineous Exudate Color: red, brown [id number] Page 9 of 11 Foul Odor After Cleansing: No Slough/Fibrino Yes Wound Bed Granulation Amount: Large (67-100%) Exposed Structure Granulation Quality: Pink, Pale Fascia Exposed: No Necrotic Amount: Small (1-33%) Fat Layer (Subcutaneous Tissue) Exposed: Yes Tendon Exposed: No Muscle Exposed: No Joint Exposed: No Bone Exposed: No Periwound Skin Texture Texture Color No Abnormalities Noted: No No Abnormalities Noted: No Callus: No Atrophie Blanche: No Crepitus: No Cyanosis: No Excoriation: No Ecchymosis: No Induration: No Erythema: No Rash: No Hemosiderin Staining: No Scarring: No Mottled: No Pallor: No Moisture Rubor: No No Abnormalities Noted: No Dry / Scaly: No Temperature / Pain Maceration: No Temperature: No Abnormality Tenderness on Palpation: Yes Treatment Notes Wound #4 (Lower Leg) Wound Laterality: Left, Anterior, Proximal Cleanser Wound Cleanser Discharge Instruction: Cleanse the wound with wound cleanser prior to applying a clean dressing using gauze sponges, not tissue or cotton balls. Peri-Wound Care Topical Primary Dressing MediHoney Gel, tube 1.5 (oz) Discharge Instruction: Apply to wound bed as instructed Hydrofera Blue Ready Foam, 4x5 in Discharge Instruction: Apply to wound bed as instructed Secondary Dressing ABD Pad, 8x10 Discharge Instruction: Apply over primary dressing as directed. Secured With [employer], 4.5x3.1 (in/yd) Discharge Instruction: Secure with Kerlix as directed. 70M  Medipore H Soft Cloth Surgical T ape, 4 x 10 (in/yd) Discharge Instruction: Secure with tape as directed. Compression Wrap TUBIGRIP SIZE D Discharge Instruction: apply in the morning and remove at night. DOUBLE IT NOW!!!!! Compression Stockings Add-Ons Electronic Signature(s) Signed: 07/15/2022 3:41:35 PM By: Deon Pilling RN, BSN Entered By: Deon Pilling on 07/15/2022 11:52:31 [name] ([id number]) [id number] Page 10 of 11 -------------------------------------------------------------------------------- Wound Assessment Details Patient Name: Date of Service: Hailey Courser, DO [name] 07/15/2022 11:30 A M Medical Record Number: [id number] Patient Account Number: [id number] Date of Birth/Sex: Treating RN: [birth year] (76 y.o. [name], [name] Primary Care Zelina Jimerson: Lorene Dy Other Clinician: Referring Coleton Woon: Treating Samson Ralph/Extender: [name] in Treatment: 6 Wound Status Wound Number: 5 Primary Cellulitis Etiology: Wound Location: Left, Distal, Anterior Lower Leg Wound Open Wounding Event: Gradually Appeared Status: Date Acquired: 04/19/2022 Comorbid Asthma, Sleep Apnea, Hypertension, Colitis, Rheumatoid Weeks Of Treatment: 6 History: Arthritis, Osteoarthritis Clustered Wound: No Photos Wound Measurements Length: (cm) 5.6 Width: (cm) 4 Depth: (cm) 0.2 Area: (cm) 17.593 Volume: (cm) 3.519 % Reduction in Area: -13.1% % Reduction in Volume: -126.3% Epithelialization: Small (1-33%) Tunneling: No Undermining: No Wound Description Classification: Full Thickness With Exposed Suppor Wound Margin: Distinct, outline attached Exudate Amount: Medium Exudate Type: Serosanguineous Exudate Color: red, brown t Structures Foul Odor After Cleansing: No Slough/Fibrino Yes Wound Bed Granulation Amount: Large (67-100%) Exposed Structure Granulation Quality: Pink, Pale Fascia Exposed: No Necrotic Amount: Small  (1-33%) Fat Layer (Subcutaneous Tissue) Exposed: Yes Necrotic Quality: Adherent Slough Tendon Exposed: Yes Muscle Exposed: No Joint Exposed: No Bone Exposed: No Periwound Skin Texture Texture Color No Abnormalities Noted: No No Abnormalities Noted: No Callus: No Atrophie Blanche: No Crepitus: No Cyanosis: No Excoriation: No Ecchymosis: No Induration: No Erythema: No Rash: No Hemosiderin Staining: No Scarring: No Mottled: No Pallor: No Moisture Rubor: No No Abnormalities Noted: No Dry / Scaly: No Temperature / Pain Maceration: No Temperature: No Abnormality  Tenderness on Palpation: Yes JAINA, MORIN ([id number]) [phone].pdf Page 11 of 11 Treatment Notes Wound #5 (Lower Leg) Wound Laterality: Left, Anterior, Distal Cleanser Wound Cleanser Discharge Instruction: Cleanse the wound with wound cleanser prior to applying a clean dressing using gauze sponges, not tissue or cotton balls. Peri-Wound Care Topical Primary Dressing MediHoney Gel, tube 1.5 (oz) Discharge Instruction: Apply to wound bed as instructed Hydrofera Blue Ready Foam, 4x5 in Discharge Instruction: Apply to wound bed as instructed Secondary Dressing ABD Pad, 8x10 Discharge Instruction: Apply over primary dressing as directed. Secured With [employer], 4.5x3.1 (in/yd) Discharge Instruction: Secure with Kerlix as directed. 110M Medipore H Soft Cloth Surgical T ape, 4 x 10 (in/yd) Discharge Instruction: Secure with tape as directed. Compression Wrap TUBIGRIP SIZE D Discharge Instruction: apply in the morning and remove at night. DOUBLE IT NOW!!!!! Compression Stockings Add-Ons Electronic Signature(s) Signed: 07/15/2022 3:41:35 PM By: Deon Pilling RN, BSN Entered By: Deon Pilling on 07/15/2022 11:52:49 -------------------------------------------------------------------------------- Vitals Details Patient Name: Date of Service: Hailey Courser, DO [name] 07/15/2022 11:30  A M Medical Record Number: [id number] Patient Account Number: [id number] Date of Birth/Sex: Treating RN: 1946/06/19 (76 y.o. [name], [name] Primary Care Uziel Covault: Lorene Dy Other Clinician: Referring Treonna Klee: Treating Tamani Durney/Extender: [name] in Treatment: 6 Vital Signs Time Taken: 11:45 Temperature (F): 98.2 Height (in): 61 Pulse (bpm): 109 Weight (lbs): 186 Respiratory Rate (breaths/min): 20 Body Mass Index (BMI): 35.1 Blood Pressure (mmHg): 148/73 Reference Range: 80 - 120 mg / dl Electronic Signature(s) Signed: 07/15/2022 3:41:35 PM By: Deon Pilling RN, BSN Entered By: Deon Pilling on 07/15/2022 11:50:39

## 2022-07-20 ENCOUNTER — Other Ambulatory Visit (HOSPITAL_COMMUNITY): Payer: Self-pay | Admitting: Internal Medicine

## 2022-07-20 DIAGNOSIS — M79604 Pain in right leg: Secondary | ICD-10-CM

## 2022-07-21 ENCOUNTER — Ambulatory Visit (HOSPITAL_COMMUNITY)
Admission: RE | Admit: 2022-07-21 | Discharge: 2022-07-21 | Disposition: A | Discharge status: Home / Self Care | Payer: Medicare Other | Source: Ambulatory Visit | Attending: Internal Medicine | Admitting: Internal Medicine

## 2022-07-21 DIAGNOSIS — M79604 Pain in right leg: Secondary | ICD-10-CM | POA: Insufficient documentation

## 2022-07-21 NOTE — Progress Notes (Signed)
VASCULAR LAB    Right lower extremity venous duplex has been performed.  See CV proc for preliminary results.  Called report to Dr. Valene Bors, [facility], RVT 07/21/2022, 11:31 AM

## 2022-07-22 ENCOUNTER — Encounter (HOSPITAL_BASED_OUTPATIENT_CLINIC_OR_DEPARTMENT_OTHER): Payer: Medicare Other | Attending: Internal Medicine | Admitting: Internal Medicine

## 2022-07-22 DIAGNOSIS — I87312 Chronic venous hypertension (idiopathic) with ulcer of left lower extremity: Secondary | ICD-10-CM | POA: Insufficient documentation

## 2022-07-22 DIAGNOSIS — J849 Interstitial pulmonary disease, unspecified: Secondary | ICD-10-CM

## 2022-07-22 DIAGNOSIS — M069 Rheumatoid arthritis, unspecified: Secondary | ICD-10-CM

## 2022-07-22 DIAGNOSIS — L97828 Non-pressure chronic ulcer of other part of left lower leg with other specified severity: Secondary | ICD-10-CM

## 2022-07-22 NOTE — Progress Notes (Signed)
DAVINE, SWENEY ([id number]) [id number] Page 1 of 9 Visit Report for 07/22/2022 Chief Complaint Document Details Patient Name: Date of Service: Hailey Courser, [name] 07/22/2022 11:15 A M Medical Record Number: [id number] Patient Account Number: [id number] Date of Birth/Sex: Treating RN: [birth year] (77 y.o. F) Primary Care Provider: Lorene Dy Other Clinician: Referring Provider: Treating Provider/Extender: [name] in Treatment: 7 Information Obtained from: Patient Chief Complaint 06/03/2022; Left lower extremity wounds Electronic Signature(s) Signed: 07/22/2022 11:59:49 AM By: Kalman Shan Hailey Entered By: Kalman Shan on 07/22/2022 11:54:32 -------------------------------------------------------------------------------- HPI Details Patient Name: Date of Service: Hailey Courser, [name] 07/22/2022 11:15 A M Medical Record Number: [id number] Patient Account Number: [id number] Date of Birth/Sex: Treating RN: 25-Dec-1945 (77 y.o. F) Primary Care Provider: Lorene Dy Other Clinician: Referring Provider: Treating Provider/Extender: [name] in Treatment: 7 History of Present Illness HPI Description: Patient presents today for initial evaluation and our clinic as a referral from the [facility] Department of orthopedics for evaluation and treatment of the wound to his his of the left foot at the base of the great toe. Currently the good news is the patient really does not have any significant pain which is excellent. She has been seen by Dr. Linus Salmons at [facility]: infectious disease clinic that is the regional Center for infectious disease. Subsequently the patient was cultured and positive for methicillin sensitive staph aureus. She has been on antibiotics including Cipro prior to surgery as will several days following surgery. She then had clindamycin which was changed to doxycycline.  She took this for 10 days. Subsequently the pain had improved and there was no longer any possible draining from the wound according to the notes. The patient sedentary rate as well as C-reactive protein have returned to normal ranges. Currently per Dr. Henreitta Leber assessment there was one dehiscence with no sign of osteomyelitis. He therefore discontinue the antibiotics with the completion of the last three days Of doxycycline. Other than that he just set her for a follow-up as needed. The patient does not have diabetes and is not a current smoker. At this time her treatment that was recommended by the surgeon was applying a Hydrocolloid dressing. Based on what I'm seeing at this point the patient actually has some Slough noted over the surface of the wound there really does not appear to be evidence of infection though I Hailey think she does require some sharp debridement to clear away the slough and help with appropriate wound healing. She does have areas of granulation buds noted. She may be a candidate for a skin substitute as well. We will see how things Hailey over the next period of time until follow-up. No fevers, chills, nausea, or vomiting noted at this time. 06/13/18 on evaluation today patient actually appears to be doing better in regard to her toe ulcer. She's been using the Prisma on this region and that seems to have done extremely well for her. Fortunately there does not appear to be evidence of infection at this time which is good news. She did see her surgeon they were extremely pleased with the overall appearance of her wound. 06/27/18 on evaluation today patient actually appears to be doing very well in regard to her foot ulcer. She has been tolerating the dressing changes without complication which is great news. She did see her podiatrist they felt like everything is looking very nice as well and will please. 07/05/2018 surgical wound on  the left foot. She appears to be doing well. Still  surface debrided to remove however in general post debridement the wound bed looks quite healthy it is come down significantly in terms of dimensions using silver collagen. The patient describes pain when her foot is elevated either in bed at night [sometimes keeps her awake] or when is propped up on a foot rest. She basically takes analgesics for this. She does not really describe claudication with activity however her activity is very limited by interstitial lung disease. Her ABIs initially in this clinic were noncompressible. She is not a diabetic 07/20/2018; surgical wound on the left foot. Wound actually looks quite a bit better than I remember seeing this. She is still describing pain with her leg elevated at night that she does not get when the wound is supine. She does not really describe claudication but she is very limited by her pulmonary status. We have been using silver collagen. 1/17; the patient has been followed up by orthopedics and discharge. Her arterial studies were actually quite good and should not be contributing to any pain. Slight reduction in ABIs but otherwise normal. Her wound is closed. She saw Dr. Linus Salmons of infectious disease surrounding the surgery. By review of our notes [name], [name] ([id number]) [id number] Page 2 of 9 she was not felt to have osteomyelitis. 06/03/2022 [name] is a 77 year old female with a past medical history of interstitial lung disease on chronic oxygen via nasal cannula, rheumatoid arthritis, chronic diastolic heart failure and OSA that presents to the clinic for a 1 to 20-monthhistory of nonhealing ulcer to the left lower extremity. On 04/20/2022 she was admitted to the hospital for left lower extremity cellulitis. She required 8 days of IV vancomycin and Unasyn followed by a 14-day course of Augmentin and doxycycline. She has been using Silvadene cream to the area. She does not use compression therapy but does  own compression stockings. She currently denies signs of infection. 11/27; patient presents for follow-up she is using Medihoney and Hydrofera Blue to the wound beds. She has been using her Tubigrip. She has no issues or complaints today. 12/4; patient presents for follow-up. She continues to use Medihoney and Hydrofera Blue to the wound beds. Several attempts have been made to schedule ABIs with TBI's. We gave patient the number today to call to have these scheduled. She has been using Tubigrip. She has no issues or complaints today. 12/18; she continues to use Medihoney and Hydrofera Blue in general the surface of the wound is looking somewhat better this week and measurements are slightly smaller. She did have her arterial studies done these were noncompressible bilaterally however TBI on the right of 0.69 on the left 0.62. Waveforms on the right were triphasic on the left biphasic to triphasic. This does not suggest significant arterial disease to make healing wounds at this location difficult. 12/29; patient presents for follow-up. She has been using Medihoney and Hydrofera Blue to the wound bed. She has been using Tubigrip. 1/5; patient presents for follow-up. She is been using Medihoney and Hydrofera Blue to the wound bed along with Tubigrip. The Tubigrip was doubled at last clinic visit. There is been improvement in wound healing. Electronic Signature(s) Signed: 07/22/2022 11:59:49 AM By: HKalman ShanDO Entered By: HKalman Shanon 07/22/2022 11:54:57 -------------------------------------------------------------------------------- Physical Exam Details Patient Name: Date of Service: CArnetha Courser [name] 07/22/2022 11:15 A M Medical Record Number: [id number] Account Number: [id number] of Birth/Sex:  Treating RN: [birth year] (77 y.o. F) Primary Care Provider: Lorene Dy Other Clinician: Referring Provider: Treating Provider/Extender: [name] in Treatment: 7 Constitutional respirations regular, non-labored and within target range for patient.. Cardiovascular 2+ dorsalis pedis/posterior tibialis pulses. Psychiatric pleasant and cooperative. Notes T the lateral aspect of the left lower extremity there are 2 open wounds with granulation tissue and scant non viable tissue. 2+ pitting edema to the knee. o Obvious deformity to the foot from previous surgeries. No signs of surrounding soft tissue infection. Electronic Signature(s) Signed: 07/22/2022 11:59:49 AM By: Kalman Shan Hailey Entered By: Kalman Shan on 07/22/2022 11:55:20 -------------------------------------------------------------------------------- Physician Orders Details Patient Name: Date of Service: Hailey Courser, [name] 07/22/2022 11:15 A M Medical Record Number: [id number] Patient Account Number: [id number] Date of Birth/Sex: Treating RN: 1946-06-27 (77 y.o. [name], [name] Primary Care Provider: Lorene Dy Other Clinician: Referring Provider: Treating Provider/Extender: [name] in Treatment: [address] [place] ([id number]) [id number] Page 3 of 9 Verbal / Phone Orders: No Diagnosis Coding ICD-10 Coding Code Description [phone] Non-pressure chronic ulcer of other part of left lower leg with other specified severity I87.312 Chronic venous hypertension (idiopathic) with ulcer of left lower extremity J84.9 Interstitial pulmonary disease, unspecified M06.9 Rheumatoid arthritis, unspecified Follow-up Appointments ppointment in 1 week. - Dr. Heber [place] 07/29/2022 0845 Return A ppointment in 2 weeks. - Dr. Heber [place] 08/05/2022 1030 Return A Other: - Speak with cardiologist related to more swelling in both legs you may need diuetric (fluid) pill adjustment. ***Cancel Dr. Gwenlyn Found referral for vascular consult.*** Anesthetic (In clinic) Topical Lidocaine 5% applied to wound bed Bathing/ Shower/  Hygiene May shower with protection but Hailey not get wound dressing(s) wet. Protect dressing(s) with water repellant cover (for example, large plastic bag) or a cast cover and may then take shower. Edema Control - Lymphedema / SCD / Other Elevate legs to the level of the heart or above for 30 minutes daily and/or when sitting for 3-4 times a day throughout the day. Avoid standing for long periods of time. Exercise regularly Other Edema Control Orders/Instructions: - wear tubigrip size D one layer to right leg. Apply in the morning and remove at night right leg only. Wound Treatment Wound #4 - Lower Leg Wound Laterality: Left, Anterior, Proximal Cleanser: Wound Cleanser (Generic) 1 x Per Day/30 Days Discharge Instructions: Cleanse the wound with wound cleanser prior to applying a clean dressing using gauze sponges, not tissue or cotton balls. Prim Dressing: MediHoney Gel, tube 1.5 (oz) 1 x Per Day/30 Days ary Discharge Instructions: Apply to wound bed as instructed Prim Dressing: Hydrofera Blue Ready Foam, 4x5 in (Generic) 1 x Per Day/30 Days ary Discharge Instructions: Apply to wound bed as instructed Secondary Dressing: ABD Pad, 8x10 (DME) (Generic) 1 x Per Day/30 Days Discharge Instructions: Apply over primary dressing as directed. Secured With: [employer], 4.5x3.1 (in/yd) (DME) (Generic) 1 x Per Day/30 Days Discharge Instructions: Secure with Kerlix as directed. Secured With: 48M Medipore H Soft Cloth Surgical T ape, 4 x 10 (in/yd) (DME) (Generic) 1 x Per Day/30 Days Discharge Instructions: Secure with tape as directed. Compression Wrap: TUBIGRIP SIZE D 1 x Per Day/30 Days Discharge Instructions: apply in the morning and remove at night. DOUBLE IT NOW!!!!! Compression Stockings: Circaid Juxta Lite Compression Wrap (DME) Left Leg Compression Amount: 30-40 mmHG Discharge Instructions: Apply Circaid Juxta Lite Compression Wrap daily as instructed. Apply first thing in the morning,  remove at night before bed. Wound #  5 - Lower Leg Wound Laterality: Left, Anterior, Distal Cleanser: Wound Cleanser (Generic) 1 x Per Day/30 Days Discharge Instructions: Cleanse the wound with wound cleanser prior to applying a clean dressing using gauze sponges, not tissue or cotton balls. Prim Dressing: MediHoney Gel, tube 1.5 (oz) 1 x Per Day/30 Days ary Discharge Instructions: Apply to wound bed as instructed Prim Dressing: Hydrofera Blue Ready Foam, 4x5 in (Generic) 1 x Per Day/30 Days ary Discharge Instructions: Apply to wound bed as instructed Secondary Dressing: ABD Pad, 8x10 (DME) (Generic) 1 x Per Day/30 Days Discharge Instructions: Apply over primary dressing as directed. Secured With: [employer], 4.5x3.1 (in/yd) (DME) (Generic) 1 x Per Day/30 Days Discharge Instructions: Secure with Kerlix as directed. Secured With: 95M Medipore H Soft Cloth Surgical T ape, 4 x 10 (in/yd) (DME) (Generic) 1 x Per Day/30 Days Discharge Instructions: Secure with tape as directed. [name], [name] ([id number]) [id number] Page 4 of 9 Compression Wrap: TUBIGRIP SIZE D 1 x Per Day/30 Days Discharge Instructions: apply in the morning and remove at night. DOUBLE IT NOW!!!!! Electronic Signature(s) Unsigned Previous Signature: 07/22/2022 11:59:49 AM Version By: Kalman Shan Hailey Entered By: Deon Pilling on 07/22/2022 12:06:08 -------------------------------------------------------------------------------- Problem List Details Patient Name: Date of Service: Hailey Hailey App, [name] 07/22/2022 11:15 A M Medical Record Number: [id number] Patient Account Number: [id number] Date of Birth/Sex: Treating RN: Dec 27, 1945 (77 y.o. [name], [name] Primary Care Provider: Lorene Dy Other Clinician: Referring Provider: Treating Provider/Extender: [name] in Treatment: 7 Active Problems ICD-10 Encounter Code Description Active Date  MDM Diagnosis L97.828 Non-pressure chronic ulcer of other part of left lower leg with other specified 06/03/2022 No Yes severity I87.312 Chronic venous hypertension (idiopathic) with ulcer of left lower extremity 06/03/2022 No Yes J84.9 Interstitial pulmonary disease, unspecified 06/03/2022 No Yes M06.9 Rheumatoid arthritis, unspecified 06/03/2022 No Yes Inactive Problems Resolved Problems Electronic Signature(s) Signed: 07/22/2022 11:59:49 AM By: Kalman Shan Hailey Entered By: Kalman Shan on 07/22/2022 11:54:18 -------------------------------------------------------------------------------- Progress Note Details Patient Name: Date of Service: Hailey Hailey App, [name] 07/22/2022 11:15 A M Medical Record Number: [id number] Patient Account Number: [id number] Date of Birth/Sex: Treating RN: Jan 29, 1946 (77 y.o. F) Primary Care Provider: Lorene Dy Other Clinician: [name] ([id number]) [id number] Page 5 of 9 Referring Provider: Treating Provider/Extender: [name] in Treatment: 7 Subjective Chief Complaint Information obtained from Patient 06/03/2022; Left lower extremity wounds History of Present Illness (HPI) Patient presents today for initial evaluation and our clinic as a referral from the [facility] health system Department of orthopedics for evaluation and treatment of the wound to his his of the left foot at the base of the great toe. Currently the good news is the patient really does not have any significant pain which is excellent. She has been seen by Dr. Linus Salmons at [facility]: infectious disease clinic that is the regional Center for infectious disease. Subsequently the patient was cultured and positive for methicillin sensitive staph aureus. She has been on antibiotics including Cipro prior to surgery as will several days following surgery. She then had clindamycin which was changed to doxycycline. She took this  for 10 days. Subsequently the pain had improved and there was no longer any possible draining from the wound according to the notes. The patient sedentary rate as well as C-reactive protein have returned to normal ranges. Currently per Dr. Henreitta Leber assessment there was one dehiscence with no sign of osteomyelitis. He therefore discontinue the antibiotics  with the completion of the last three days Of doxycycline. Other than that he just set her for a follow-up as needed. The patient does not have diabetes and is not a current smoker. At this time her treatment that was recommended by the surgeon was applying a Hydrocolloid dressing. Based on what I'm seeing at this point the patient actually has some Slough noted over the surface of the wound there really does not appear to be evidence of infection though I Hailey think she does require some sharp debridement to clear away the slough and help with appropriate wound healing. She does have areas of granulation buds noted. She may be a candidate for a skin substitute as well. We will see how things Hailey over the next period of time until follow-up. No fevers, chills, nausea, or vomiting noted at this time. 06/13/18 on evaluation today patient actually appears to be doing better in regard to her toe ulcer. She's been using the Prisma on this region and that seems to have done extremely well for her. Fortunately there does not appear to be evidence of infection at this time which is good news. She did see her surgeon they were extremely pleased with the overall appearance of her wound. 06/27/18 on evaluation today patient actually appears to be doing very well in regard to her foot ulcer. She has been tolerating the dressing changes without complication which is great news. She did see her podiatrist they felt like everything is looking very nice as well and will please. 07/05/2018 surgical wound on the left foot. She appears to be doing well. Still surface  debrided to remove however in general post debridement the wound bed looks quite healthy it is come down significantly in terms of dimensions using silver collagen. The patient describes pain when her foot is elevated either in bed at night [sometimes keeps her awake] or when is propped up on a foot rest. She basically takes analgesics for this. She does not really describe claudication with activity however her activity is very limited by interstitial lung disease. Her ABIs initially in this clinic were noncompressible. She is not a diabetic 07/20/2018; surgical wound on the left foot. Wound actually looks quite a bit better than I remember seeing this. She is still describing pain with her leg elevated at night that she does not get when the wound is supine. She does not really describe claudication but she is very limited by her pulmonary status. We have been using silver collagen. 1/17; the patient has been followed up by orthopedics and discharge. Her arterial studies were actually quite good and should not be contributing to any pain. Slight reduction in ABIs but otherwise normal. Her wound is closed. She saw Dr. Linus Salmons of infectious disease surrounding the surgery. By review of our notes she was not felt to have osteomyelitis. 06/03/2022 Ms. Bret Stamour is a 77 year old female with a past medical history of interstitial lung disease on chronic oxygen via nasal cannula, rheumatoid arthritis, chronic diastolic heart failure and OSA that presents to the clinic for a 1 to 28-monthhistory of nonhealing ulcer to the left lower extremity. On 04/20/2022 she was admitted to the hospital for left lower extremity cellulitis. She required 8 days of IV vancomycin and Unasyn followed by a 14-day course of Augmentin and doxycycline. She has been using Silvadene cream to the area. She does not use compression therapy but does own compression stockings. She currently denies signs of infection. 11/27; patient  presents for  follow-up she is using Medihoney and Hydrofera Blue to the wound beds. She has been using her Tubigrip. She has no issues or complaints today. 12/4; patient presents for follow-up. She continues to use Medihoney and Hydrofera Blue to the wound beds. Several attempts have been made to schedule ABIs with TBI's. We gave patient the number today to call to have these scheduled. She has been using Tubigrip. She has no issues or complaints today. 12/18; she continues to use Medihoney and Hydrofera Blue in general the surface of the wound is looking somewhat better this week and measurements are slightly smaller. She did have her arterial studies done these were noncompressible bilaterally however TBI on the right of 0.69 on the left 0.62. Waveforms on the right were triphasic on the left biphasic to triphasic. This does not suggest significant arterial disease to make healing wounds at this location difficult. 12/29; patient presents for follow-up. She has been using Medihoney and Hydrofera Blue to the wound bed. She has been using Tubigrip. 1/5; patient presents for follow-up. She is been using Medihoney and Hydrofera Blue to the wound bed along with Tubigrip. The Tubigrip was doubled at last clinic visit. There is been improvement in wound healing. Patient History Information obtained from Patient. Family History Cancer - Maternal Grandparents, Diabetes - Siblings, Heart Disease - Maternal Grandparents,Paternal Grandparents,Mother,Father,Siblings, Hypertension - Mother,Father,Siblings, Stroke - Paternal Grandparents, No family history of Hereditary Spherocytosis, Kidney Disease, Lung Disease, Seizures, Thyroid Problems, Tuberculosis. Social History Never smoker, Marital Status - Married, Alcohol Use - Moderate, Drug Use - No History, Caffeine Use - Moderate. Medical History Eyes Denies history of Cataracts, Optic Neuritis Ear/Nose/Mouth/Throat Denies history of Chronic sinus  problems/congestion Hematologic/Lymphatic Denies history of Anemia, Hemophilia, Human Immunodeficiency Virus, Lymphedema, Sickle Cell Disease Respiratory Patient has history of Asthma, Sleep Apnea Denies history of Aspiration, Chronic Obstructive Pulmonary Disease (COPD), Pneumothorax, Tuberculosis Cardiovascular Patient has history of Hypertension [name], [name] ([id number]) [phone].pdf Page 6 of 9 Denies history of Angina, Arrhythmia, Congestive Heart Failure, Coronary Artery Disease, Deep Vein Thrombosis, Hypotension, Myocardial Infarction, Peripheral Arterial Disease, Peripheral Venous Disease, Phlebitis, Vasculitis Gastrointestinal Patient has history of Colitis Denies history of Cirrhosis , Crohnoos, Hepatitis A, Hepatitis B, Hepatitis C Endocrine Denies history of Type I Diabetes, Type II Diabetes Immunological Denies history of Lupus Erythematosus, Raynaudoos, Scleroderma Integumentary (Skin) Denies history of History of Burn Musculoskeletal Patient has history of Rheumatoid Arthritis, Osteoarthritis Denies history of Gout, Osteomyelitis Neurologic Denies history of Dementia, Neuropathy, Quadriplegia, Paraplegia, Seizure Disorder Oncologic Denies history of Received Chemotherapy, Received Radiation Psychiatric Denies history of Anorexia/bulimia, Confinement Anxiety Hospitalization/Surgery History - surgery toe. - right hip replacement 12/2018. - heart cath 12/17/2021. Medical A Surgical History Notes nd Respiratory 4L [place] pulmonary fibrosis Objective Constitutional respirations regular, non-labored and within target range for patient.. Vitals Time Taken: 11:18 AM, Height: 61 in, Weight: 186 lbs, BMI: 35.1, Temperature: 97.7 F, Pulse: 100 bpm, Respiratory Rate: 20 breaths/min, Blood Pressure: 132/74 mmHg. Cardiovascular 2+ dorsalis pedis/posterior tibialis pulses. Psychiatric pleasant and cooperative. General Notes: T the lateral aspect  of the left lower extremity there are 2 open wounds with granulation tissue and scant non viable tissue. 2+ pitting edema to o the knee. Obvious deformity to the foot from previous surgeries. No signs of surrounding soft tissue infection. Integumentary (Hair, Skin) Wound #4 status is Open. Original cause of wound was Gradually Appeared. The date acquired was: 04/19/2022. The wound has been in treatment 7 weeks. The wound is located on the Left,Proximal,Anterior Lower  Leg. The wound measures 2.2cm length x 1.4cm width x 0.2cm depth; 2.419cm^2 area and 0.484cm^3 volume. There is Fat Layer (Subcutaneous Tissue) exposed. There is no tunneling or undermining noted. There is a medium amount of serosanguineous drainage noted. The wound margin is distinct with the outline attached to the wound base. There is large (67-100%) pink, pale granulation within the wound bed. There is no necrotic tissue within the wound bed. The periwound skin appearance did not exhibit: Callus, Crepitus, Excoriation, Induration, Rash, Scarring, Dry/Scaly, Maceration, Atrophie Blanche, Cyanosis, Ecchymosis, Hemosiderin Staining, Mottled, Pallor, Rubor, Erythema. Periwound temperature was noted as No Abnormality. The periwound has tenderness on palpation. Wound #5 status is Open. Original cause of wound was Gradually Appeared. The date acquired was: 04/19/2022. The wound has been in treatment 7 weeks. The wound is located on the [facility] Lower Leg. The wound measures 5.4cm length x 3.9cm width x 0.2cm depth; 16.54cm^2 area and 3.308cm^3 volume. There is tendon and Fat Layer (Subcutaneous Tissue) exposed. There is no tunneling or undermining noted. There is a medium amount of serosanguineous drainage noted. The wound margin is distinct with the outline attached to the wound base. There is large (67-100%) pink, pale granulation within the wound bed. There is a small (1-33%) amount of necrotic tissue within the wound bed  including Adherent Slough. The periwound skin appearance did not exhibit: Callus, Crepitus, Excoriation, Induration, Rash, Scarring, Dry/Scaly, Maceration, Atrophie Blanche, Cyanosis, Ecchymosis, Hemosiderin Staining, Mottled, Pallor, Rubor, Erythema. Periwound temperature was noted as No Abnormality. The periwound has tenderness on palpation. Assessment Active Problems ICD-10 Non-pressure chronic ulcer of other part of left lower leg with other specified severity Chronic venous hypertension (idiopathic) with ulcer of left lower extremity Interstitial pulmonary disease, unspecified Rheumatoid arthritis, unspecified [name], [name] ([id number]) [id number] Page 7 of 9 Patient's wounds appear well-healing. There is more granulation tissue present. After closer review of ABIs And improvement in wound healing from last clinic visit with an increase in compression patient should have adequate blood flow for wound healing. She does not need to follow-up with Dr. Gwenlyn Found at this time. It appears that her swelling is a big barrier to her wound healing. I recommended she talk with her cardiologist about her diuretic regiment. She would benefit from an increase. She currently declines in office compression wraps. She has had surgery on her foot and this has changed the anatomy and she is concerned about putting this in a compression wrap. For now I recommended continuing with Medihoney and Hydrofera Blue and Tubigrip. Plan Follow-up Appointments: Return Appointment in 1 week. - Dr. Heber Quesada 07/29/2022 [place] Return Appointment in 2 weeks. - Dr. Heber [place] 08/05/2022 1030 Other: - Speak with cardiologist related to more swelling in both legs you may need diuetric (fluid) pill adjustment. ***Cancel Dr. Gwenlyn Found referral for vascular consult.*** Anesthetic: (In clinic) Topical Lidocaine 5% applied to wound bed Bathing/ Shower/ Hygiene: May shower with protection but Hailey not get wound  dressing(s) wet. Protect dressing(s) with water repellant cover (for example, large plastic bag) or a cast cover and may then take shower. Edema Control - Lymphedema / SCD / Other: Elevate legs to the level of the heart or above for 30 minutes daily and/or when sitting for 3-4 times a day throughout the day. Avoid standing for long periods of time. Exercise regularly Other Edema Control Orders/Instructions: - wear tubigrip size D one layer to right leg. Apply in the morning and remove at night right leg only. WOUND #4: - Lower Leg Wound Laterality:  Left, Anterior, Proximal Cleanser: Wound Cleanser (Generic) 1 x Per Day/30 Days Discharge Instructions: Cleanse the wound with wound cleanser prior to applying a clean dressing using gauze sponges, not tissue or cotton balls. Prim Dressing: MediHoney Gel, tube 1.5 (oz) 1 x Per Day/30 Days ary Discharge Instructions: Apply to wound bed as instructed Prim Dressing: Hydrofera Blue Ready Foam, 4x5 in (Generic) 1 x Per Day/30 Days ary Discharge Instructions: Apply to wound bed as instructed Secondary Dressing: ABD Pad, 8x10 (DME) (Generic) 1 x Per Day/30 Days Discharge Instructions: Apply over primary dressing as directed. Secured With: [employer], 4.5x3.1 (in/yd) (DME) (Generic) 1 x Per Day/30 Days Discharge Instructions: Secure with Kerlix as directed. Secured With: 72M Medipore H Soft Cloth Surgical T ape, 4 x 10 (in/yd) (DME) (Generic) 1 x Per Day/30 Days Discharge Instructions: Secure with tape as directed. Com pression Wrap: TUBIGRIP SIZE D 1 x Per Day/30 Days Discharge Instructions: apply in the morning and remove at night. DOUBLE IT NOW!!!!! WOUND #5: - Lower Leg Wound Laterality: Left, Anterior, Distal Cleanser: Wound Cleanser (Generic) 1 x Per Day/30 Days Discharge Instructions: Cleanse the wound with wound cleanser prior to applying a clean dressing using gauze sponges, not tissue or cotton balls. Prim Dressing: MediHoney Gel, tube  1.5 (oz) 1 x Per Day/30 Days ary Discharge Instructions: Apply to wound bed as instructed Prim Dressing: Hydrofera Blue Ready Foam, 4x5 in (Generic) 1 x Per Day/30 Days ary Discharge Instructions: Apply to wound bed as instructed Secondary Dressing: ABD Pad, 8x10 (DME) (Generic) 1 x Per Day/30 Days Discharge Instructions: Apply over primary dressing as directed. Secured With: [employer], 4.5x3.1 (in/yd) (DME) (Generic) 1 x Per Day/30 Days Discharge Instructions: Secure with Kerlix as directed. Secured With: 72M Medipore H Soft Cloth Surgical T ape, 4 x 10 (in/yd) (DME) (Generic) 1 x Per Day/30 Days Discharge Instructions: Secure with tape as directed. Com pression Wrap: TUBIGRIP SIZE D 1 x Per Day/30 Days Discharge Instructions: apply in the morning and remove at night. DOUBLE IT NOW!!!!! 1. Medihoney and Hydrofera Blue 2. Tubigrip size D double layer 3. Follow-up in 1 week 4. Follow-up with cardiology to discuss diuretic regimen Electronic Signature(s) Signed: 07/22/2022 11:59:49 AM By: Kalman Shan Hailey Entered By: Kalman Shan on 07/22/2022 11:58:57 -------------------------------------------------------------------------------- HxROS Details Patient Name: Date of Service: Hailey Courser, [name] 07/22/2022 11:15 A M Medical Record Number: [id number] Patient Account Number: [id number] Date of Birth/Sex: Treating RN: 02/14/1946 (77 y.o. F) Primary Care Provider: Lorene Dy Other Clinician: Referring Provider: Treating Provider/Extender: [name] in Treatment: [address], [place] ([id number]) [id number] Page 8 of 9 Information Obtained From Patient Eyes Medical History: Negative for: Cataracts; Optic Neuritis Ear/Nose/Mouth/Throat Medical History: Negative for: Chronic sinus problems/congestion Hematologic/Lymphatic Medical History: Negative for: Anemia; Hemophilia; Human Immunodeficiency Virus; Lymphedema;  Sickle Cell Disease Respiratory Medical History: Positive for: Asthma; Sleep Apnea Negative for: Aspiration; Chronic Obstructive Pulmonary Disease (COPD); Pneumothorax; Tuberculosis Past Medical History Notes: 4L [place] 02 pulmonary fibrosis Cardiovascular Medical History: Positive for: Hypertension Negative for: Angina; Arrhythmia; Congestive Heart Failure; Coronary Artery Disease; Deep Vein Thrombosis; Hypotension; Myocardial Infarction; Peripheral Arterial Disease; Peripheral Venous Disease; Phlebitis; Vasculitis Gastrointestinal Medical History: Positive for: Colitis Negative for: Cirrhosis ; Crohns; Hepatitis A; Hepatitis B; Hepatitis C Endocrine Medical History: Negative for: Type I Diabetes; Type II Diabetes Immunological Medical History: Negative for: Lupus Erythematosus; Raynauds; Scleroderma Integumentary (Skin) Medical History: Negative for: History of Burn Musculoskeletal Medical History: Positive for: Rheumatoid Arthritis;  Osteoarthritis Negative for: Gout; Osteomyelitis Neurologic Medical History: Negative for: Dementia; Neuropathy; Quadriplegia; Paraplegia; Seizure Disorder Oncologic Medical History: Negative for: Received Chemotherapy; Received Radiation Psychiatric Medical History: Negative for: Jeri Lager Anxiety Immunizations BIANNCA, SCANTLIN ([id number]) [id number] Page 9 of 9 Pneumococcal Vaccine: Received Pneumococcal Vaccination: Yes Received Pneumococcal Vaccination On or After 60th Birthday: Yes Implantable Devices No devices added Hospitalization / Surgery History Type of Hospitalization/Surgery surgery toe right hip replacement 12/2018 heart cath 12/17/2021 Family and Social History Cancer: Yes - Maternal Grandparents; Diabetes: Yes - Siblings; Heart Disease: Yes - Maternal Grandparents,Paternal Grandparents,Mother,Father,Siblings; Hereditary Spherocytosis: No; Hypertension: Yes -  Mother,Father,Siblings; Kidney Disease: No; Lung Disease: No; Seizures: No; Stroke: Yes - Paternal Grandparents; Thyroid Problems: No; Tuberculosis: No; Never smoker; Marital Status - Married; Alcohol Use: Moderate; Drug Use: No History; Caffeine Use: Moderate; Financial Concerns: No; Food, Clothing or Shelter Needs: No; Support System Lacking: No; Transportation Concerns: No Electronic Signature(s) Signed: 07/22/2022 11:59:49 AM By: Kalman Shan Hailey Entered By: Kalman Shan on 07/22/2022 11:55:02 -------------------------------------------------------------------------------- SuperBill Details Patient Name: Date of Service: Hailey Hailey App, [name] 07/22/2022 Medical Record Number: [id number] Patient Account Number: [id number] Date of Birth/Sex: Treating RN: 1945-12-02 (77 y.o. F) Primary Care Provider: Lorene Dy Other Clinician: Referring Provider: Treating Provider/Extender: [name] in Treatment: 7 Diagnosis Coding ICD-10 Codes Code Description [phone] Non-pressure chronic ulcer of other part of left lower leg with other specified severity I87.312 Chronic venous hypertension (idiopathic) with ulcer of left lower extremity J84.9 Interstitial pulmonary disease, unspecified M06.9 Rheumatoid arthritis, unspecified Physician Procedures : CPT4 Code Description Modifier [id number] - WC PHYS LEVEL 3 - EST PT ICD-10 Diagnosis Description L97.828 Non-pressure chronic ulcer of other part of left lower leg with other specified severity I87.312 Chronic venous hypertension (idiopathic) with  ulcer of left lower extremity J84.9 Interstitial pulmonary disease, unspecified M06.9 Rheumatoid arthritis, unspecified Quantity: 1 Electronic Signature(s) Signed: 07/22/2022 11:59:49 AM By: Kalman Shan Hailey Entered By: Kalman Shan on 07/22/2022 11:59:18

## 2022-07-23 NOTE — Progress Notes (Signed)
NEAH, SPORRER ([id number]) [id number] Page 1 of 7 Visit Report for 07/22/2022 Arrival Information Details Patient Name: Date of Service: Hailey Courser, DO [name] 07/22/2022 11:15 A M Medical Record Number: [id number] Patient Account Number: [id number] Date of Birth/Sex: Treating RN: 07-15-1946 (77 y.o. F) Primary Care [name]: Lorene Dy Other Clinician: Referring Janeen Watson: Treating [name]/Extender: [name] in Treatment: 7 Visit Information History Since Last Visit Added or deleted any medications: No Patient Arrived: Gilford Rile Any new allergies or adverse reactions: No Arrival Time: 11:15 Had a fall or experienced change in No Accompanied By: husband activities of daily living that may affect Transfer Assistance: None risk of falls: Patient Identification Verified: Yes Signs or symptoms of abuse/neglect since last visito No Secondary Verification Process Completed: Yes Hospitalized since last visit: No Patient Requires Transmission-Based Precautions: No Implantable device outside of the clinic excluding No Patient Has Alerts: Yes cellular tissue based products placed in the center Patient Alerts: ABI's: L: N/C 11/23 since last visit: Has Dressing in Place as Prescribed: Yes Pain Present Now: No Electronic Signature(s) Signed: 07/22/2022 12:12:32 PM By: Erenest Blank Entered By: Erenest Blank on 07/22/2022 11:16:16 -------------------------------------------------------------------------------- Lower Extremity Assessment Details Patient Name: Date of Service: Hailey Courser, DO [name] 07/22/2022 11:15 A M Medical Record Number: [id number] Patient Account Number: [id number] Date of Birth/Sex: Treating RN: 1946/01/06 (77 y.o. F) Primary Care Miyoshi Ligas: Lorene Dy Other Clinician: Referring Venecia Mehl: Treating Matia Zelada/Extender: [name] in Treatment: 7 Edema Assessment Assessed: [name]: Yes]  [Right: No] Edema: [Left: Ye] [Right: s] Calf Left: Right: Point of Measurement: 29 cm From Medial Instep 29 cm Ankle Left: Right: Point of Measurement: 9 cm From Medial Instep 23 cm Knee To Floor Left: Right: From Medial Instep 38 cm Electronic Signature(s) PEARLA, MCKINNY ([id number]) [id number] Page 2 of 7 Signed: 07/22/2022 4:55:01 PM By: Deon Pilling RN, BSN Entered By: Deon Pilling on 07/22/2022 12:03:57 -------------------------------------------------------------------------------- Multi Wound Chart Details Patient Name: Date of Service: [name], DO [name] 07/22/2022 11:15 A M Medical Record Number: [id number] Patient Account Number: [id number] Date of Birth/Sex: Treating RN: [birth year] (77 y.o. F) Primary Care Khalilah Hoke: Lorene Dy Other Clinician: Referring Tenise Stetler: Treating Nyela Cortinas/Extender: [name] in Treatment: 7 Vital Signs Height(in): 61 Pulse(bpm): 100 Weight(lbs): 186 Blood Pressure(mmHg): 132/74 Body Mass Index(BMI): 35.1 Temperature(F): 97.7 Respiratory Rate(breaths/min): 20 [4:Photos:] [N/A:N/A] Left, Proximal, Anterior Lower Leg Left, Distal, Anterior Lower Leg N/A Wound Location: Gradually Appeared Gradually Appeared N/A Wounding Event: Cellulitis Cellulitis N/A Primary Etiology: Asthma, Sleep Apnea, Hypertension, Asthma, Sleep Apnea, Hypertension, N/A Comorbid History: Colitis, Rheumatoid Arthritis, Colitis, Rheumatoid Arthritis, Osteoarthritis Osteoarthritis 04/19/2022 04/19/2022 N/A Date Acquired: 7 7 N/A Weeks of Treatment: Open Open N/A Wound Status: No No N/A Wound Recurrence: 2.2x1.4x0.2 5.4x3.9x0.2 N/A Measurements L x W x D (cm) 2.419 16.54 N/A A (cm) : rea 0.484 3.308 N/A Volume (cm) : 43.00% -6.40% N/A % Reduction in Area: 61.90% -112.70% N/A % Reduction in Volume: Full Thickness With Exposed Support Full Thickness With Exposed Support  N/A Classification: Structures Structures Medium Medium N/A Exudate Amount: Serosanguineous Serosanguineous N/A Exudate Type: red, brown red, brown N/A Exudate Color: Distinct, outline attached Distinct, outline attached N/A Wound Margin: Large (67-100%) Large (67-100%) N/A Granulation Amount: Pink, Pale Pink, Pale N/A Granulation Quality: None Present (0%) Small (1-33%) N/A Necrotic Amount: Fat Layer (Subcutaneous Tissue): Yes Fat Layer (Subcutaneous Tissue): Yes N/A Exposed Structures: Fascia: No Tendon: Yes Tendon: No Fascia: No Muscle:  No Muscle: No Joint: No Joint: No Bone: No Bone: No Small (1-33%) Small (1-33%) N/A Epithelialization: Excoriation: No Excoriation: No N/A Periwound Skin Texture: Induration: No Induration: No Callus: No Callus: No Crepitus: No Crepitus: No Rash: No Rash: No Scarring: No Scarring: No Maceration: No Maceration: No N/A Periwound Skin Moisture: Dry/Scaly: No Dry/Scaly: No Atrophie Blanche: No Atrophie Blanche: No N/A Periwound Skin Color: Cyanosis: No Cyanosis: No Ecchymosis: No Ecchymosis: No JESSA, STINSON ([id number]) [id number] Page 3 of 7 Erythema: No Erythema: No Hemosiderin Staining: No Hemosiderin Staining: No Mottled: No Mottled: No Pallor: No Pallor: No Rubor: No Rubor: No No Abnormality No Abnormality N/A Temperature: Yes Yes N/A Tenderness on Palpation: Treatment Notes Electronic Signature(s) Signed: 07/22/2022 11:59:49 AM By: Kalman Shan DO Entered By: Kalman Shan on 07/22/2022 11:54:25 -------------------------------------------------------------------------------- Multi-Disciplinary Care Plan Details Patient Name: Date of Service: Hailey Courser, DO [name] 07/22/2022 11:15 A M Medical Record Number: [id number] Patient Account Number: [id number] Date of Birth/Sex: Treating RN: [birth year] (77 y.o. [name], [name] Primary Care Margarethe Virgen: Lorene Dy Other  Clinician: Referring Mohd Clemons: Treating Khair Chasteen/Extender: [name] in Treatment: 7 Active Inactive Pain, Acute or Chronic Nursing Diagnoses: Pain, acute or chronic: actual or potential Potential alteration in comfort, pain Goals: Patient will verbalize adequate pain control and receive pain control interventions during procedures as needed Date Initiated: 06/03/2022 Target Resolution Date: 09/15/2022 Goal Status: Active Patient/caregiver will verbalize comfort level met Date Initiated: 06/03/2022 Target Resolution Date: 09/15/2022 Goal Status: Active Interventions: Encourage patient to take pain medications as prescribed Provide education on pain management Treatment Activities: Administer pain control measures as ordered : 06/03/2022 Notes: Venous Leg Ulcer Nursing Diagnoses: Potential for venous Insuffiency (use before diagnosis confirmed) Goals: Patient will maintain optimal edema control Date Initiated: 06/03/2022 Target Resolution Date: 09/15/2022 Goal Status: Active Interventions: Assess peripheral edema status every visit. Compression as ordered Provide education on venous insufficiency Treatment Activities: Therapeutic compression applied : 06/03/2022 RODNEY, WIGGER ([id number]) [id number] Page 4 of 7 Notes: Electronic Signature(s) Signed: 07/22/2022 4:55:01 PM By: Deon Pilling RN, BSN Entered By: Deon Pilling on 07/22/2022 11:51:38 -------------------------------------------------------------------------------- Pain Assessment Details Patient Name: Date of Service: Hailey Courser, DO [name] 07/22/2022 11:15 A M Medical Record Number: [id number] Patient Account Number: [id number] Date of Birth/Sex: Treating RN: May 03, 1946 (77 y.o. F) Primary Care Kilo Eshelman: Lorene Dy Other Clinician: Referring Kelechi Astarita: Treating Kailia Starry/Extender: [name] in Treatment: 7 Active  Problems Location of Pain Severity and Description of Pain Patient Has Paino No Site Locations Pain Management and Medication Current Pain Management: Electronic Signature(s) Signed: 07/22/2022 12:12:32 PM By: Erenest Blank Entered By: Erenest Blank on 07/22/2022 11:19:50 -------------------------------------------------------------------------------- Patient/Caregiver Education Details Patient Name: Date of Service: [name], DO RO Marry Guan 1/5/2024andnbsp11:15 A M Medical Record Number: [id number] Patient Account Number: [id number] Date of Birth/Gender: Treating RN: 03-25-1946 (77 y.o. [name], [name] Primary Care Physician: Lorene Dy Other Clinician: Referring Physician: Treating Physician/Extender: [name] in Treatment: [address] [place], Arvil Persons ([id number]) [id number] Page 5 of 7 Education Provided To: Patient Education Topics Provided Wound/Skin Impairment: Handouts: Caring for Your Ulcer Methods: Explain/Verbal Responses: Reinforcements needed Electronic Signature(s) Signed: 07/22/2022 4:55:01 PM By: Deon Pilling RN, BSN Entered By: Deon Pilling on 07/22/2022 11:51:51 -------------------------------------------------------------------------------- Wound Assessment Details Patient Name: Date of Service: Hailey Courser, DO [name] 07/22/2022 11:15 A M Medical Record Number: [id number] Patient Account Number: [id number] Date of Birth/Sex: Treating RN: [birth year] (76  y.o. F) Primary Care Deane Wattenbarger: Lorene Dy Other Clinician: Referring Julus Kelley: Treating Sharnika Binney/Extender: [name] in Treatment: 7 Wound Status Wound Number: 4 Primary Cellulitis Etiology: Wound Location: Left, Proximal, Anterior Lower Leg Wound Open Wounding Event: Gradually Appeared Status: Date Acquired: 04/19/2022 Comorbid Asthma, Sleep Apnea, Hypertension, Colitis, Rheumatoid Weeks Of Treatment:  7 History: Arthritis, Osteoarthritis Clustered Wound: No Photos Wound Measurements Length: (cm) 2.2 Width: (cm) 1.4 Depth: (cm) 0.2 Area: (cm) 2.419 Volume: (cm) 0.484 % Reduction in Area: 43% % Reduction in Volume: 61.9% Epithelialization: Small (1-33%) Tunneling: No Undermining: No Wound Description Classification: Full Thickness With Exposed Suppor Wound Margin: Distinct, outline attached Exudate Amount: Medium Exudate Type: Serosanguineous Exudate Color: red, brown t Structures Foul Odor After Cleansing: No Slough/Fibrino Yes Wound Bed Granulation Amount: Large (67-100%) Exposed Structure Granulation Quality: Pink, Pale Fascia Exposed: No Necrotic Amount: None Present (0%) Fat Layer (Subcutaneous Tissue) Exposed: Yes Tendon Exposed: No Muscle Exposed: No MALAIAH, VIRAMONTES ([id number]) [id number] Page 6 of 7 Joint Exposed: No Bone Exposed: No Periwound Skin Texture Texture Color No Abnormalities Noted: No No Abnormalities Noted: No Callus: No Atrophie Blanche: No Crepitus: No Cyanosis: No Excoriation: No Ecchymosis: No Induration: No Erythema: No Rash: No Hemosiderin Staining: No Scarring: No Mottled: No Pallor: No Moisture Rubor: No No Abnormalities Noted: No Dry / Scaly: No Temperature / Pain Maceration: No Temperature: No Abnormality Tenderness on Palpation: Yes Electronic Signature(s) Signed: 07/22/2022 12:12:32 PM By: Erenest Blank Entered By: Erenest Blank on 07/22/2022 11:28:19 -------------------------------------------------------------------------------- Wound Assessment Details Patient Name: Date of Service: Hailey Courser, DO [name] 07/22/2022 11:15 A M Medical Record Number: [id number] Patient Account Number: [id number] Date of Birth/Sex: Treating RN: Nov 02, 1945 (77 y.o. F) Primary Care Alka Falwell: Lorene Dy Other Clinician: Referring Yahayra Geis: Treating Davia Smyre/Extender: [name] in Treatment: 7 Wound Status Wound Number: 5 Primary Cellulitis Etiology: Wound Location: Left, Distal, Anterior Lower Leg Wound Open Wounding Event: Gradually Appeared Status: Date Acquired: 04/19/2022 Comorbid Asthma, Sleep Apnea, Hypertension, Colitis, Rheumatoid Weeks Of Treatment: 7 History: Arthritis, Osteoarthritis Clustered Wound: No Photos Wound Measurements Length: (cm) 5.4 Width: (cm) 3.9 Depth: (cm) 0.2 Area: (cm) 16.54 Volume: (cm) 3.308 % Reduction in Area: -6.4% % Reduction in Volume: -112.7% Epithelialization: Small (1-33%) Tunneling: No Undermining: No Wound Description Classification: Full Thickness With Exposed Support Structures Wound Margin: Distinct, outline attached Exudate Amount: Medium Exudate Type: Serosanguineous Mesta, Kazaria S ([id number]) Exudate Color: red, brown Foul Odor After Cleansing: No Slough/Fibrino Yes [id number] Page 7 of 7 Wound Bed Granulation Amount: Large (67-100%) Exposed Structure Granulation Quality: Pink, Pale Fascia Exposed: No Necrotic Amount: Small (1-33%) Fat Layer (Subcutaneous Tissue) Exposed: Yes Necrotic Quality: Adherent Slough Tendon Exposed: Yes Muscle Exposed: No Joint Exposed: No Bone Exposed: No Periwound Skin Texture Texture Color No Abnormalities Noted: No No Abnormalities Noted: No Callus: No Atrophie Blanche: No Crepitus: No Cyanosis: No Excoriation: No Ecchymosis: No Induration: No Erythema: No Rash: No Hemosiderin Staining: No Scarring: No Mottled: No Pallor: No Moisture Rubor: No No Abnormalities Noted: No Dry / Scaly: No Temperature / Pain Maceration: No Temperature: No Abnormality Tenderness on Palpation: Yes Electronic Signature(s) Signed: 07/22/2022 12:12:32 PM By: Erenest Blank Entered By: Erenest Blank on 07/22/2022 11:28:46 -------------------------------------------------------------------------------- Vitals Details Patient  Name: Date of Service: Hailey Courser, DO [name] 07/22/2022 11:15 A M Medical Record Number: [id number] Patient Account Number: [id number] Date of Birth/Sex: Treating RN: 1945-12-27 (77 y.o. F) Primary Care Kito Cuffe: Lorene Dy Other Clinician: Referring Deryk Bozman: Treating  Muriel Wilber/Extender: [name] in Treatment: 7 Vital Signs Time Taken: 11:18 Temperature (F): 97.7 Height (in): 61 Pulse (bpm): 100 Weight (lbs): 186 Respiratory Rate (breaths/min): 20 Body Mass Index (BMI): 35.1 Blood Pressure (mmHg): 132/74 Reference Range: 80 - 120 mg / dl Electronic Signature(s) Signed: 07/22/2022 12:12:32 PM By: Erenest Blank Entered By: Erenest Blank on 07/22/2022 11:19:32

## 2022-07-29 ENCOUNTER — Encounter (HOSPITAL_BASED_OUTPATIENT_CLINIC_OR_DEPARTMENT_OTHER): Payer: Medicare Other | Admitting: Internal Medicine

## 2022-07-29 DIAGNOSIS — J849 Interstitial pulmonary disease, unspecified: Secondary | ICD-10-CM | POA: Diagnosis not present

## 2022-07-29 DIAGNOSIS — L97828 Non-pressure chronic ulcer of other part of left lower leg with other specified severity: Secondary | ICD-10-CM | POA: Diagnosis not present

## 2022-07-29 DIAGNOSIS — I87312 Chronic venous hypertension (idiopathic) with ulcer of left lower extremity: Secondary | ICD-10-CM | POA: Diagnosis present

## 2022-07-29 DIAGNOSIS — M069 Rheumatoid arthritis, unspecified: Secondary | ICD-10-CM | POA: Diagnosis not present

## 2022-07-29 NOTE — Progress Notes (Signed)
[name], [name] ([id number]) [id number] Page 1 of 10 Visit Report for 07/29/2022 Arrival Information Details Patient Name: Date of Service: Hailey Courser, DO [name] 07/29/2022 8:45 A M Medical Record Number: [id number] Patient Account Number: [id number] Date of Birth/Sex: Treating RN: 03/16/1946 (77 y.o. F) Primary Care Junior Huezo: Lorene Dy Other Clinician: Referring Viktor Philipp: Treating Danetra Glock/Extender: [name] in Treatment: 8 Visit Information History Since Last Visit Added or deleted any medications: No Patient Arrived: Wheel Chair Any new allergies or adverse reactions: No Arrival Time: 09:14 Had a fall or experienced change in No Accompanied By: husband activities of daily living that may affect Transfer Assistance: None risk of falls: Patient Identification Verified: Yes Signs or symptoms of abuse/neglect since last visito No Secondary Verification Process Completed: Yes Hospitalized since last visit: No Patient Requires Transmission-Based Precautions: No Implantable device outside of the clinic excluding No Patient Has Alerts: Yes cellular tissue based products placed in the center Patient Alerts: ABI's: L: N/C 11/23 since last visit: Has Dressing in Place as Prescribed: Yes Pain Present Now: No Electronic Signature(s) Signed: 07/29/2022 11:33:40 AM By: [name] Entered By: [name] on 07/29/2022 09:14:41 -------------------------------------------------------------------------------- Encounter Discharge Information Details Patient Name: Date of Service: Hailey Courser, DO [name] 07/29/2022 8:45 A M Medical Record Number: [id number] Patient Account Number: [id number] Date of Birth/Sex: Treating RN: [birth year] (77 y.o. [name], [name] Primary Care Ryver Poblete: Lorene Dy Other Clinician: Referring Kylle Lall: Treating Laverda Stribling/Extender: [name] in Treatment: 8 Encounter  Discharge Information Items Post Procedure Vitals Discharge Condition: Stable Temperature (F): 98.2 Ambulatory Status: Wheelchair Pulse (bpm): 102 Discharge Destination: Home Respiratory Rate (breaths/min): 20 Transportation: Private Auto Blood Pressure (mmHg): 143/72 Accompanied By: spouse Schedule Follow-up Appointment: Yes Clinical Summary of Care: Electronic Signature(s) Signed: 07/29/2022 3:19:36 PM By: Deon Pilling RN, BSN Entered By: Deon Pilling on 07/29/2022 09:44:13 [name] ([id number]) [id number] Page 2 of 10 -------------------------------------------------------------------------------- Lower Extremity Assessment Details Patient Name: Date of Service: [name] RO [place] S. 07/29/2022 8:45 A M Medical Record Number: [id number] Patient Account Number: [id number] Date of Birth/Sex: Treating RN: [birth year] (77 y.o. F) Primary Care Malekai Markwood: Lorene Dy Other Clinician: Referring Roniqua Kintz: Treating Embry Manrique/Extender: [name] in Treatment: 8 Edema Assessment Assessed: [Left: No] [Right: No] Edema: [Left: Ye] [Right: s] Calf Left: Right: Point of Measurement: 29 cm From Medial Instep 30 cm Ankle Left: Right: Point of Measurement: 9 cm From Medial Instep 24.4 cm Electronic Signature(s) Signed: 07/29/2022 11:33:40 AM By: [name] Entered By: [name] on 07/29/2022 09:20:21 -------------------------------------------------------------------------------- Multi Wound Chart Details Patient Name: Date of Service: Hailey Courser, DO [name] 07/29/2022 8:45 A M Medical Record Number: [id number] Patient Account Number: [id number] Date of Birth/Sex: Treating RN: 1945/07/26 (77 y.o. F) Primary Care Bonny Vanleeuwen: Lorene Dy Other Clinician: Referring Malyiah Fellows: Treating Aakash Hollomon/Extender: [name] in Treatment: 8 Vital Signs Height(in): 61 Pulse(bpm): 102 Weight(lbs):  186 Blood Pressure(mmHg): 143/72 Body Mass Index(BMI): 35.1 Temperature(F): 98.2 Respiratory Rate(breaths/min): 18 [4:Photos:] [N/A:N/A] Left, Proximal, Anterior Lower Leg Left, Distal, Anterior Lower Leg N/A Wound Location: Gradually Appeared Gradually Appeared N/A Wounding Event: Cellulitis Cellulitis N/A Primary Etiology: Asthma, Sleep Apnea, Hypertension, Asthma, Sleep Apnea, Hypertension, N/A Comorbid History: Colitis, Rheumatoid Arthritis, Colitis, Rheumatoid Arthritis, Osteoarthritis Osteoarthritis [name] ([id number]) [id number] Page 3 of 10 04/19/2022 04/19/2022 N/A Date Acquired: 8 8 N/A Weeks of Treatment: Open Open N/A Wound Status: No No N/A Wound Recurrence: 1.9x1.4x0.2 5.5x3.4x0.2  N/A Measurements L x W x D (cm) 2.089 14.687 N/A A (cm) : rea 0.418 2.937 N/A Volume (cm) : 50.70% 5.60% N/A % Reduction in Area: 67.10% -88.90% N/A % Reduction in Volume: Full Thickness With Exposed Support Full Thickness With Exposed Support N/A Classification: Structures Structures Medium Medium N/A Exudate A mount: Serosanguineous Serosanguineous N/A Exudate Type: red, brown red, brown N/A Exudate Color: Distinct, outline attached Distinct, outline attached N/A Wound Margin: Large (67-100%) Large (67-100%) N/A Granulation A mount: Pink, Pale Pink, Pale N/A Granulation Quality: None Present (0%) Small (1-33%) N/A Necrotic A mount: Fat Layer (Subcutaneous Tissue): Yes Fat Layer (Subcutaneous Tissue): Yes N/A Exposed Structures: Fascia: No Tendon: Yes Tendon: No Fascia: No Muscle: No Muscle: No Joint: No Joint: No Bone: No Bone: No Small (1-33%) Small (1-33%) N/A Epithelialization: Debridement - Excisional Debridement - Excisional N/A Debridement: Pre-procedure Verification/Time Out 09:30 09:30 N/A Taken: Lidocaine 4% Topical Solution Lidocaine 4% Topical Solution N/A Pain Control: Subcutaneous, Slough Subcutaneous,  Slough N/A Tissue Debrided: Skin/Subcutaneous Tissue Skin/Subcutaneous Tissue N/A Level: 2.66 18.7 N/A Debridement A (sq cm): rea Curette Curette N/A Instrument: Minimum Minimum N/A Bleeding: Pressure Pressure N/A Hemostasis A chieved: 0 0 N/A Procedural Pain: 0 0 N/A Post Procedural Pain: Procedure was tolerated well Procedure was tolerated well N/A Debridement Treatment Response: 1.9x1.4x0.2 5.5x3.4x0.2 N/A Post Debridement Measurements L x W x D (cm) 0.418 2.937 N/A Post Debridement Volume: (cm) Excoriation: No Excoriation: No N/A Periwound Skin Texture: Induration: No Induration: No Callus: No Callus: No Crepitus: No Crepitus: No Rash: No Rash: No Scarring: No Scarring: No Maceration: No Maceration: No N/A Periwound Skin Moisture: Dry/Scaly: No Dry/Scaly: No Atrophie Blanche: No Atrophie Blanche: No N/A Periwound Skin Color: Cyanosis: No Cyanosis: No Ecchymosis: No Ecchymosis: No Erythema: No Erythema: No Hemosiderin Staining: No Hemosiderin Staining: No Mottled: No Mottled: No Pallor: No Pallor: No Rubor: No Rubor: No No Abnormality No Abnormality N/A Temperature: Yes Yes N/A Tenderness on Palpation: Debridement Debridement N/A Procedures Performed: Treatment Notes Wound #4 (Lower Leg) Wound Laterality: Left, Anterior, Proximal Cleanser Wound Cleanser Discharge Instruction: Cleanse the wound with wound cleanser prior to applying a clean dressing using gauze sponges, not tissue or cotton balls. Peri-Wound Care Topical Primary Dressing MediHoney Gel, tube 1.5 (oz) Discharge Instruction: Apply to wound bed as instructed Hydrofera Blue Ready Foam, 4x5 in Discharge Instruction: Apply to wound bed as instructed Secondary Dressing ABD Pad, 8x10 Discharge Instruction: Apply over primary dressing as directed. SANII, KUKLA ([id number]) [id number] Page 4 of 10 Secured With [employer], 4.5x3.1  (in/yd) Discharge Instruction: Secure with Kerlix as directed. 730M Medipore H Soft Cloth Surgical T ape, 4 x 10 (in/yd) Discharge Instruction: Secure with tape as directed. Compression Wrap Compression Stockings Circaid Juxta Lite Compression Wrap Quantity: 1 Left Leg Compression Amount: 20-30 mmHg Discharge Instruction: Apply Circaid Juxta Lite Compression Wrap daily as instructed. Apply first thing in the morning, remove at night before bed. Add-Ons Wound #5 (Lower Leg) Wound Laterality: Left, Anterior, Distal Cleanser Wound Cleanser Discharge Instruction: Cleanse the wound with wound cleanser prior to applying a clean dressing using gauze sponges, not tissue or cotton balls. Peri-Wound Care Topical Primary Dressing MediHoney Gel, tube 1.5 (oz) Discharge Instruction: Apply to wound bed as instructed Hydrofera Blue Ready Foam, 4x5 in Discharge Instruction: Apply to wound bed as instructed Secondary Dressing ABD Pad, 8x10 Discharge Instruction: Apply over primary dressing as directed. Secured With [employer], 4.5x3.1 (in/yd) Discharge Instruction: Secure with Kerlix as directed. 730M Medipore H  Soft Cloth Surgical T ape, 4 x 10 (in/yd) Discharge Instruction: Secure with tape as directed. Compression Wrap Compression Stockings Add-Ons Electronic Signature(s) Signed: 07/29/2022 11:36:12 AM By: Kalman Shan DO Entered By: Kalman Shan on 07/29/2022 10:28:11 -------------------------------------------------------------------------------- Multi-Disciplinary Care Plan Details Patient Name: Date of Service: Hailey Courser, DO [name] 07/29/2022 8:45 A M Medical Record Number: [id number] Patient Account Number: [id number] Date of Birth/Sex: Treating RN: [birth year] (77 y.o. [name], [name] Primary Care Hajra Port: Lorene Dy Other Clinician: Referring Tawney Vanorman: Treating Majd Tissue/Extender: [name] in Treatment: [address] [place],  Arvil Persons ([id number]) [id number] Page 5 of 10 Pain, Acute or Chronic Nursing Diagnoses: Pain, acute or chronic: actual or potential Potential alteration in comfort, pain Goals: Patient will verbalize adequate pain control and receive pain control interventions during procedures as needed Date Initiated: 06/03/2022 Target Resolution Date: 09/15/2022 Goal Status: Active Patient/caregiver will verbalize comfort level met Date Initiated: 06/03/2022 Target Resolution Date: 09/15/2022 Goal Status: Active Interventions: Encourage patient to take pain medications as prescribed Provide education on pain management Treatment Activities: Administer pain control measures as ordered : 06/03/2022 Notes: Venous Leg Ulcer Nursing Diagnoses: Potential for venous Insuffiency (use before diagnosis confirmed) Goals: Patient will maintain optimal edema control Date Initiated: 06/03/2022 Target Resolution Date: 09/15/2022 Goal Status: Active Interventions: Assess peripheral edema status every visit. Compression as ordered Provide education on venous insufficiency Treatment Activities: Therapeutic compression applied : 06/03/2022 Notes: Electronic Signature(s) Signed: 07/29/2022 3:19:36 PM By: Deon Pilling RN, BSN Entered By: Deon Pilling on 07/29/2022 09:42:48 -------------------------------------------------------------------------------- Pain Assessment Details Patient Name: Date of Service: Hailey Courser, DO [name] 07/29/2022 8:45 A M Medical Record Number: [id number] Patient Account Number: [id number] Date of Birth/Sex: Treating RN: [birth year] (77 y.o. F) Primary Care Makhia Vosler: Lorene Dy Other Clinician: Referring Tyrez Berrios: Treating Moua Rasmusson/Extender: [name] in Treatment: 8 Active Problems Location of Pain Severity and Description of Pain Patient Has Paino No Site Locations [place], [place] ([id number])  [id number] Page 6 of 10 Pain Management and Medication Current Pain Management: Electronic Signature(s) Signed: 07/29/2022 11:33:40 AM By: [name] Entered By: [name] on 07/29/2022 09:15:11 -------------------------------------------------------------------------------- Patient/Caregiver Education Details Patient Name: Date of Service: [name], DO RO Marry Guan 1/12/2024andnbsp8:45 A M Medical Record Number: [id number] Patient Account Number: [id number] Date of Birth/Gender: Treating RN: January 24, 1946 (77 y.o. [name] Primary Care Physician: Lorene Dy Other Clinician: Referring Physician: Treating Physician/Extender: [name] in Treatment: 8 Education Assessment Education Provided To: Patient Education Topics Provided Venous: Handouts: Controlling Swelling with Compression Stockings Methods: Explain/Verbal Responses: Reinforcements needed, State content correctly Electronic Signature(s) Signed: 07/29/2022 3:19:36 PM By: Deon Pilling RN, BSN Entered By: Deon Pilling on 07/29/2022 09:42:59 -------------------------------------------------------------------------------- Wound Assessment Details Patient Name: Date of Service: Hailey Courser, DO [name] 07/29/2022 8:45 A M Medical Record Number: [id number] Patient Account Number: [id number] Date of Birth/Sex: Treating RN: [birth year] (77 y.o. F) Primary Care Meldrick Buttery: Lorene Dy Other Clinician: [name] ([id number]) [id number] Page 7 of 10 Referring Tomicka Lover: Treating Tobie Hellen/Extender: [name] in Treatment: 8 Wound Status Wound Number: 4 Primary Cellulitis Etiology: Wound Location: Left, Proximal, Anterior Lower Leg Wound Open Wounding Event: Gradually Appeared Status: Date Acquired: 04/19/2022 Comorbid Asthma, Sleep Apnea, Hypertension, Colitis, Rheumatoid Weeks Of Treatment:  8 History: Arthritis, Osteoarthritis Clustered Wound: No Photos Wound Measurements Length: (cm) 1.9 Width: (cm) 1.4 Depth: (cm) 0.2 Area: (cm) 2.089 Volume: (cm) 0.418 % Reduction in Area: 50.7% %  Reduction in Volume: 67.1% Epithelialization: Small (1-33%) Tunneling: No Undermining: No Wound Description Classification: Full Thickness With Exposed Support Structures Wound Margin: Distinct, outline attached Exudate Amount: Medium Exudate Type: Serosanguineous Exudate Color: red, brown Foul Odor After Cleansing: No Slough/Fibrino Yes Wound Bed Granulation Amount: Large (67-100%) Exposed Structure Granulation Quality: Pink, Pale Fascia Exposed: No Necrotic Amount: None Present (0%) Fat Layer (Subcutaneous Tissue) Exposed: Yes Tendon Exposed: No Muscle Exposed: No Joint Exposed: No Bone Exposed: No Periwound Skin Texture Texture Color No Abnormalities Noted: No No Abnormalities Noted: No Callus: No Atrophie Blanche: No Crepitus: No Cyanosis: No Excoriation: No Ecchymosis: No Induration: No Erythema: No Rash: No Hemosiderin Staining: No Scarring: No Mottled: No Pallor: No Moisture Rubor: No No Abnormalities Noted: No Dry / Scaly: No Temperature / Pain Maceration: No Temperature: No Abnormality Tenderness on Palpation: Yes Treatment Notes Wound #4 (Lower Leg) Wound Laterality: Left, Anterior, Proximal Cleanser Wound Cleanser Discharge Instruction: Cleanse the wound with wound cleanser prior to applying a clean dressing using gauze sponges, not tissue or cotton balls. Peri-Wound Care LEELA, VANBROCKLIN ([id number]) [id number] Page 8 of 10 Topical Primary Dressing MediHoney Gel, tube 1.5 (oz) Discharge Instruction: Apply to wound bed as instructed Hydrofera Blue Ready Foam, 4x5 in Discharge Instruction: Apply to wound bed as instructed Secondary Dressing ABD Pad, 8x10 Discharge Instruction: Apply over primary dressing as  directed. Secured With [employer], 4.5x3.1 (in/yd) Discharge Instruction: Secure with Kerlix as directed. 52M Medipore H Soft Cloth Surgical T ape, 4 x 10 (in/yd) Discharge Instruction: Secure with tape as directed. Compression Wrap Compression Stockings Circaid Juxta Lite Compression Wrap Quantity: 1 Left Leg Compression Amount: 20-30 mmHg Discharge Instruction: Apply Circaid Juxta Lite Compression Wrap daily as instructed. Apply first thing in the morning, remove at night before bed. Add-Ons Electronic Signature(s) Signed: 07/29/2022 11:33:40 AM By: [name] Entered By: [name] on 07/29/2022 09:22:26 -------------------------------------------------------------------------------- Wound Assessment Details Patient Name: Date of Service: Hailey Courser, DO [name] 07/29/2022 8:45 A M Medical Record Number: [id number] Patient Account Number: [id number] Date of Birth/Sex: Treating RN: 20-Jul-1945 (77 y.o. F) Primary Care Jerman Tinnon: Lorene Dy Other Clinician: Referring Laylah Riga: Treating Keneth Borg/Extender: [name] in Treatment: 8 Wound Status Wound Number: 5 Primary Cellulitis Etiology: Wound Location: Left, Distal, Anterior Lower Leg Wound Open Wounding Event: Gradually Appeared Status: Date Acquired: 04/19/2022 Comorbid Asthma, Sleep Apnea, Hypertension, Colitis, Rheumatoid Weeks Of Treatment: 8 History: Arthritis, Osteoarthritis Clustered Wound: No Photos Wound Measurements Length: (cm) 5.5 CORAH, WILLEFORD ([id number]) Width: (cm) 3.4 Depth: (cm) 0.2 Area: (cm) 14.687 Volume: (cm) 2.937 % Reduction in Area: 5.6% [phone].pdf Page 9 of 10 % Reduction in Volume: -88.9% Epithelialization: Small (1-33%) Tunneling: No Undermining: No Wound Description Classification: Full Thickness With Exposed Support Structures Wound Margin: Distinct, outline attached Exudate Amount: Medium Exudate Type:  Serosanguineous Exudate Color: red, brown Foul Odor After Cleansing: No Slough/Fibrino Yes Wound Bed Granulation Amount: Large (67-100%) Exposed Structure Granulation Quality: Pink, Pale Fascia Exposed: No Necrotic Amount: Small (1-33%) Fat Layer (Subcutaneous Tissue) Exposed: Yes Necrotic Quality: Adherent Slough Tendon Exposed: Yes Muscle Exposed: No Joint Exposed: No Bone Exposed: No Periwound Skin Texture Texture Color No Abnormalities Noted: No No Abnormalities Noted: No Callus: No Atrophie Blanche: No Crepitus: No Cyanosis: No Excoriation: No Ecchymosis: No Induration: No Erythema: No Rash: No Hemosiderin Staining: No Scarring: No Mottled: No Pallor: No Moisture Rubor: No No Abnormalities Noted: No Dry / Scaly: No Temperature / Pain Maceration: No Temperature: No Abnormality Tenderness  on Palpation: Yes Treatment Notes Wound #5 (Lower Leg) Wound Laterality: Left, Anterior, Distal Cleanser Wound Cleanser Discharge Instruction: Cleanse the wound with wound cleanser prior to applying a clean dressing using gauze sponges, not tissue or cotton balls. Peri-Wound Care Topical Primary Dressing MediHoney Gel, tube 1.5 (oz) Discharge Instruction: Apply to wound bed as instructed Hydrofera Blue Ready Foam, 4x5 in Discharge Instruction: Apply to wound bed as instructed Secondary Dressing ABD Pad, 8x10 Discharge Instruction: Apply over primary dressing as directed. Secured With [employer], 4.5x3.1 (in/yd) Discharge Instruction: Secure with Kerlix as directed. 27M Medipore H Soft Cloth Surgical T ape, 4 x 10 (in/yd) Discharge Instruction: Secure with tape as directed. Compression Wrap Compression Stockings Add-Ons Electronic Signature(s) Signed: 07/29/2022 11:33:40 AM By: [name] Entered By: [name] on 07/29/2022 09:22:54 [name] ([id number]) [id number] Page 10 of  10 -------------------------------------------------------------------------------- Vitals Details Patient Name: Date of Service: Hailey Courser, DO [name] 07/29/2022 8:45 A M Medical Record Number: [id number] Patient Account Number: [id number] Date of Birth/Sex: Treating RN: August 23, 1945 (77 y.o. F) Primary Care Journey Ratterman: Lorene Dy Other Clinician: Referring Mateja Dier: Treating Citlalic Norlander/Extender: [name] in Treatment: 8 Vital Signs Time Taken: 09:14 Temperature (F): 98.2 Height (in): 61 Pulse (bpm): 102 Weight (lbs): 186 Respiratory Rate (breaths/min): 18 Body Mass Index (BMI): 35.1 Blood Pressure (mmHg): 143/72 Reference Range: 80 - 120 mg / dl Electronic Signature(s) Signed: 07/29/2022 11:33:40 AM By: [name] Entered By: [name] on 07/29/2022 09:15:04

## 2022-07-29 NOTE — Progress Notes (Signed)
CHELE, CORNELL ([id number]) [id number] Page 1 of 11 Visit Report for 07/29/2022 Chief Complaint Document Details Patient Name: Date of Service: Hailey Courser, DO [name] 07/29/2022 8:45 A M Medical Record Number: [id number] Patient Account Number: [id number] Date of Birth/Sex: Treating RN: [birth year] (77 y.o. F) Primary Care Provider: Lorene Dy Other Clinician: Referring Provider: Treating Provider/Extender: [name] in Treatment: 8 Information Obtained from: Patient Chief Complaint 06/03/2022; Left lower extremity wounds Electronic Signature(s) Signed: 07/29/2022 11:36:12 AM By: Kalman Shan DO Entered By: Kalman Shan on 07/29/2022 10:28:17 -------------------------------------------------------------------------------- Debridement Details Patient Name: Date of Service: Hailey Courser, DO [name] 07/29/2022 8:45 A M Medical Record Number: [id number] Patient Account Number: [id number] Date of Birth/Sex: Treating RN: 1946/04/01 (77 y.o. [name], [name] Primary Care Provider: Lorene Dy Other Clinician: Referring Provider: Treating Provider/Extender: [name] in Treatment: 8 Debridement Performed for Assessment: Wound #5 Left,Distal,Anterior Lower Leg Performed By: Physician Kalman Shan, DO Debridement Type: Debridement Level of Consciousness (Pre-procedure): Awake and Alert Pre-procedure Verification/Time Out Yes - 09:30 Taken: Start Time: 09:31 Pain Control: Lidocaine 4% T opical Solution T Area Debrided (L x W): otal 5.5 (cm) x 3.4 (cm) = 18.7 (cm) Tissue and other material debrided: Viable, Non-Viable, Slough, Subcutaneous, Biofilm, Slough Level: Skin/Subcutaneous Tissue Debridement Description: Excisional Instrument: Curette Bleeding: Minimum Hemostasis Achieved: Pressure End Time: 09:36 Procedural Pain: 0 Post Procedural Pain: 0 Response to Treatment: Procedure was  tolerated well Level of Consciousness (Post- Awake and Alert procedure): Post Debridement Measurements of Total Wound Length: (cm) 5.5 Width: (cm) 3.4 Depth: (cm) 0.2 Volume: (cm) 2.937 Character of Wound/Ulcer Post Debridement: Improved Post Procedure Diagnosis [name], Hailey ([id number]) [id number] Page 2 of 11 Same as Pre-procedure Electronic Signature(s) Signed: 07/29/2022 11:36:12 AM By: Kalman Shan DO Signed: 07/29/2022 3:19:36 PM By: Deon Pilling RN, BSN Entered By: Deon Pilling on 07/29/2022 09:36:53 -------------------------------------------------------------------------------- Debridement Details Patient Name: Date of Service: Hailey Courser, DO [name] 07/29/2022 8:45 A M Medical Record Number: [id number] Patient Account Number: [id number] Date of Birth/Sex: Treating RN: February 06, 1946 (77 y.o. [name], [name] Primary Care Provider: Lorene Dy Other Clinician: Referring Provider: Treating Provider/Extender: [name] in Treatment: 8 Debridement Performed for Assessment: Wound #4 Left,Proximal,Anterior Lower Leg Performed By: Physician Kalman Shan, DO Debridement Type: Debridement Level of Consciousness (Pre-procedure): Awake and Alert Pre-procedure Verification/Time Out Yes - 09:30 Taken: Start Time: 09:31 Pain Control: Lidocaine 4% T opical Solution T Area Debrided (L x W): otal 1.9 (cm) x 1.4 (cm) = 2.66 (cm) Tissue and other material debrided: Viable, Non-Viable, Slough, Subcutaneous, Biofilm, Slough Level: Skin/Subcutaneous Tissue Debridement Description: Excisional Instrument: Curette Bleeding: Minimum Hemostasis Achieved: Pressure End Time: 09:36 Procedural Pain: 0 Post Procedural Pain: 0 Response to Treatment: Procedure was tolerated well Level of Consciousness (Post- Awake and Alert procedure): Post Debridement Measurements of Total Wound Length: (cm) 1.9 Width: (cm) 1.4 Depth: (cm)  0.2 Volume: (cm) 0.418 Character of Wound/Ulcer Post Debridement: Improved Post Procedure Diagnosis Same as Pre-procedure Electronic Signature(s) Signed: 07/29/2022 11:36:12 AM By: Kalman Shan DO Signed: 07/29/2022 3:19:36 PM By: Deon Pilling RN, BSN Entered By: Deon Pilling on 07/29/2022 09:37:11 -------------------------------------------------------------------------------- HPI Details Patient Name: Date of Service: Hailey Courser, DO [name] 07/29/2022 8:45 A M Medical Record Number: [id number] Patient Account Number: [id number] Date of Birth/Sex: Treating RN: 25-Aug-1945 (77 y.o. F) Primary Care Provider: Lorene Dy Other Clinician: [name] ([id number]) [id number] Page 3 of 11 Referring Provider:  Treating Provider/Extender: [name] in Treatment: 8 History of Present Illness HPI Description: Patient presents today for initial evaluation and our clinic as a referral from the [facility] health system Department of orthopedics for evaluation and treatment of the wound to his his of the left foot at the base of the great toe. Currently the good news is the patient really does not have any significant pain which is excellent. She has been seen by Dr. Linus Salmons at [facility]: infectious disease clinic that is the regional Center for infectious disease. Subsequently the patient was cultured and positive for methicillin sensitive staph aureus. She has been on antibiotics including Cipro prior to surgery as will several days following surgery. She then had clindamycin which was changed to doxycycline. She took this for 10 days. Subsequently the pain had improved and there was no longer any possible draining from the wound according to the notes. The patient sedentary rate as well as C-reactive protein have returned to normal ranges. Currently per Dr. Henreitta Leber assessment there was one dehiscence with no sign of osteomyelitis. He  therefore discontinue the antibiotics with the completion of the last three days Of doxycycline. Other than that he just set her for a follow-up as needed. The patient does not have diabetes and is not a current smoker. At this time her treatment that was recommended by the surgeon was applying a Hydrocolloid dressing. Based on what I'm seeing at this point the patient actually has some Slough noted over the surface of the wound there really does not appear to be evidence of infection though I do think she does require some sharp debridement to clear away the slough and help with appropriate wound healing. She does have areas of granulation buds noted. She may be a candidate for a skin substitute as well. We will see how things do over the next period of time until follow-up. No fevers, chills, nausea, or vomiting noted at this time. 06/13/18 on evaluation today patient actually appears to be doing better in regard to her toe ulcer. She's been using the Prisma on this region and that seems to have done extremely well for her. Fortunately there does not appear to be evidence of infection at this time which is good news. She did see her surgeon they were extremely pleased with the overall appearance of her wound. 06/27/18 on evaluation today patient actually appears to be doing very well in regard to her foot ulcer. She has been tolerating the dressing changes without complication which is great news. She did see her podiatrist they felt like everything is looking very nice as well and will please. 07/05/2018 surgical wound on the left foot. She appears to be doing well. Still surface debrided to remove however in general post debridement the wound bed looks quite healthy it is come down significantly in terms of dimensions using silver collagen. The patient describes pain when her foot is elevated either in bed at night [sometimes keeps her awake] or when is propped up on a foot rest. She  basically takes analgesics for this. She does not really describe claudication with activity however her activity is very limited by interstitial lung disease. Her ABIs initially in this clinic were noncompressible. She is not a diabetic 07/20/2018; surgical wound on the left foot. Wound actually looks quite a bit better than I remember seeing this. She is still describing pain with her leg elevated at night that she does not get when the wound is supine.  She does not really describe claudication but she is very limited by her pulmonary status. We have been using silver collagen. 1/17; the patient has been followed up by orthopedics and discharge. Her arterial studies were actually quite good and should not be contributing to any pain. Slight reduction in ABIs but otherwise normal. Her wound is closed. She saw Dr. Linus Salmons of infectious disease surrounding the surgery. By review of our notes she was not felt to have osteomyelitis. 06/03/2022 Ms. Ocie Stanzione is a 77 year old female with a past medical history of interstitial lung disease on chronic oxygen via nasal cannula, rheumatoid arthritis, chronic diastolic heart failure and OSA that presents to the clinic for a 1 to 39-monthhistory of nonhealing ulcer to the left lower extremity. On 04/20/2022 she was admitted to the hospital for left lower extremity cellulitis. She required 8 days of IV vancomycin and Unasyn followed by a 14-day course of Augmentin and doxycycline. She has been using Silvadene cream to the area. She does not use compression therapy but does own compression stockings. She currently denies signs of infection. 11/27; patient presents for follow-up she is using Medihoney and Hydrofera Blue to the wound beds. She has been using her Tubigrip. She has no issues or complaints today. 12/4; patient presents for follow-up. She continues to use Medihoney and Hydrofera Blue to the wound beds. Several attempts have been made to schedule ABIs  with TBI's. We gave patient the number today to call to have these scheduled. She has been using Tubigrip. She has no issues or complaints today. 12/18; she continues to use Medihoney and Hydrofera Blue in general the surface of the wound is looking somewhat better this week and measurements are slightly smaller. She did have her arterial studies done these were noncompressible bilaterally however TBI on the right of 0.69 on the left 0.62. Waveforms on the right were triphasic on the left biphasic to triphasic. This does not suggest significant arterial disease to make healing wounds at this location difficult. 12/29; patient presents for follow-up. She has been using Medihoney and Hydrofera Blue to the wound bed. She has been using Tubigrip. 1/5; patient presents for follow-up. She is been using Medihoney and Hydrofera Blue to the wound bed along with Tubigrip. The Tubigrip was doubled at last clinic visit. There is been improvement in wound healing. 1/12; patient presents for follow-up. She has been using Medihoney and Hydrofera Blue to the wound bed along with Tubigrip. There continues to be improvement in wound healing. Electronic Signature(s) Signed: 07/29/2022 11:36:12 AM By: HKalman ShanDO Entered By: HKalman Shanon 07/29/2022 10:29:07 -------------------------------------------------------------------------------- Physical Exam Details Patient Name: Date of Service: CArnetha Courser DO [name] 07/29/2022 8:45 A M Medical Record Number: [id number] Account Number: [id number] of Birth/Sex: Treating RN: [birth year](77y.o. F) Primary Care Provider: RLorene DyOther Clinician: Referring Provider: Treating Provider/Extender: HKrista Bluein Treatment: [address] DArvil Persons([id number] Page 4 of 11 Constitutional respirations regular, non-labored and within target range for patient.. Cardiovascular 2+ dorsalis  pedis/posterior tibialis pulses. Psychiatric pleasant and cooperative. Notes T the lateral aspect of the left lower extremity there are 2 open wounds with granulation tissue and non viable tissue. 2+ pitting edema to the knee. Obvious o deformity to the foot from previous surgeries. No signs of surrounding soft tissue infection. Electronic Signature(s) Signed: 07/29/2022 11:36:12 AM By: HKalman ShanDO Entered By: HKalman Shanon 07/29/2022 10:29:37 -------------------------------------------------------------------------------- Physician Orders Details Patient Name: Date  of Service: [name], DO [name] 07/29/2022 8:45 A M Medical Record Number: [id number] Patient Account Number: [id number] Date of Birth/Sex: Treating RN: Jul 03, 1946 (77 y.o. [name], [name] Primary Care Provider: Lorene Dy Other Clinician: Referring Provider: Treating Provider/Extender: [name] in Treatment: 8 Verbal / Phone Orders: No Diagnosis Coding ICD-10 Coding Code Description [phone] Non-pressure chronic ulcer of other part of left lower leg with other specified severity I87.312 Chronic venous hypertension (idiopathic) with ulcer of left lower extremity J84.9 Interstitial pulmonary disease, unspecified M06.9 Rheumatoid arthritis, unspecified Follow-up Appointments ppointment in 1 week. - Dr. Heber Tryon 08/05/2022 1030 Friday Return A ppointment in 2 weeks. - Dr. Heber  Thursday 1230 08/11/2022 Return A Anesthetic (In clinic) Topical Lidocaine 5% applied to wound bed Bathing/ Shower/ Hygiene May shower with protection but do not get wound dressing(s) wet. Protect dressing(s) with water repellant cover (for example, large plastic bag) or a cast cover and may then take shower. Edema Control - Lymphedema / SCD / Other Elevate legs to the level of the heart or above for 30 minutes daily and/or when sitting for 3-4 times a day throughout the day. Avoid standing for  long periods of time. Exercise regularly Compression stocking or Garment 20-30 mm/Hg pressure to: - start wearing the juxtalite HD apply in the morning and remove at night left leg. Wound Treatment Wound #4 - Lower Leg Wound Laterality: Left, Anterior, Proximal Cleanser: Wound Cleanser (Generic) 1 x Per Day/30 Days Discharge Instructions: Cleanse the wound with wound cleanser prior to applying a clean dressing using gauze sponges, not tissue or cotton balls. Prim Dressing: MediHoney Gel, tube 1.5 (oz) 1 x Per Day/30 Days ary Discharge Instructions: Apply to wound bed as instructed Prim Dressing: Hydrofera Blue Ready Foam, 4x5 in (Generic) 1 x Per Day/30 Days ary Discharge Instructions: Apply to wound bed as instructed Secondary Dressing: ABD Pad, 8x10 (Generic) 1 x Per Day/30 Days Discharge Instructions: Apply over primary dressing as directed. Hailey, [name] ([id number]) [id number] Page 5 of 11 Secured With: [employer], 4.5x3.1 (in/yd) (Generic) 1 x Per Day/30 Days Discharge Instructions: Secure with Kerlix as directed. Secured With: 74M Medipore H Soft Cloth Surgical T ape, 4 x 10 (in/yd) (Generic) 1 x Per Day/30 Days Discharge Instructions: Secure with tape as directed. Compression Stockings: Circaid Juxta Lite Compression Wrap Left Leg Compression Amount: 20-30 mmHG Discharge Instructions: Apply Circaid Juxta Lite Compression Wrap daily as instructed. Apply first thing in the morning, remove at night before bed. Wound #5 - Lower Leg Wound Laterality: Left, Anterior, Distal Cleanser: Wound Cleanser (Generic) 1 x Per Day/30 Days Discharge Instructions: Cleanse the wound with wound cleanser prior to applying a clean dressing using gauze sponges, not tissue or cotton balls. Prim Dressing: MediHoney Gel, tube 1.5 (oz) 1 x Per Day/30 Days ary Discharge Instructions: Apply to wound bed as instructed Prim Dressing: Hydrofera Blue Ready Foam, 4x5 in  (Generic) 1 x Per Day/30 Days ary Discharge Instructions: Apply to wound bed as instructed Secondary Dressing: ABD Pad, 8x10 (Generic) 1 x Per Day/30 Days Discharge Instructions: Apply over primary dressing as directed. Secured With: [employer], 4.5x3.1 (in/yd) (Generic) 1 x Per Day/30 Days Discharge Instructions: Secure with Kerlix as directed. Secured With: 74M Medipore H Soft Cloth Surgical T ape, 4 x 10 (in/yd) (Generic) 1 x Per Day/30 Days Discharge Instructions: Secure with tape as directed. Electronic Signature(s) Signed: 07/29/2022 11:36:12 AM By: Kalman Shan DO Entered By: Kalman Shan on 07/29/2022 10:29:44 --------------------------------------------------------------------------------  Problem List Details Patient Name: Date of Service: [name] RO [place] S. 07/29/2022 8:45 A M Medical Record Number: [id number] Patient Account Number: [id number] Date of Birth/Sex: Treating RN: 1946/06/11 (77 y.o. [name], [name] Primary Care Provider: Lorene Dy Other Clinician: Referring Provider: Treating Provider/Extender: [name] in Treatment: 8 Active Problems ICD-10 Encounter Code Description Active Date MDM Diagnosis L97.828 Non-pressure chronic ulcer of other part of left lower leg with other specified 06/03/2022 No Yes severity I87.312 Chronic venous hypertension (idiopathic) with ulcer of left lower extremity 06/03/2022 No Yes J84.9 Interstitial pulmonary disease, unspecified 06/03/2022 No Yes M06.9 Rheumatoid arthritis, unspecified 06/03/2022 No Yes POOJA, CAMUSO ([id number]) [phone].pdf Page 6 of 11 Inactive Problems Resolved Problems Electronic Signature(s) Signed: 07/29/2022 11:36:12 AM By: Kalman Shan DO Entered By: Kalman Shan on 07/29/2022 10:27:27 -------------------------------------------------------------------------------- Progress Note Details Patient Name: Date of  Service: Hailey Courser, DO [name] 07/29/2022 8:45 A M Medical Record Number: [id number] Patient Account Number: [id number] Date of Birth/Sex: Treating RN: Nov 23, 1945 (77 y.o. F) Primary Care Provider: Lorene Dy Other Clinician: Referring Provider: Treating Provider/Extender: [name] in Treatment: 8 Subjective Chief Complaint Information obtained from Patient 06/03/2022; Left lower extremity wounds History of Present Illness (HPI) Patient presents today for initial evaluation and our clinic as a referral from the [facility] health system Department of orthopedics for evaluation and treatment of the wound to his his of the left foot at the base of the great toe. Currently the good news is the patient really does not have any significant pain which is excellent. She has been seen by Dr. Linus Salmons at [facility]: infectious disease clinic that is the regional Center for infectious disease. Subsequently the patient was cultured and positive for methicillin sensitive staph aureus. She has been on antibiotics including Cipro prior to surgery as will several days following surgery. She then had clindamycin which was changed to doxycycline. She took this for 10 days. Subsequently the pain had improved and there was no longer any possible draining from the wound according to the notes. The patient sedentary rate as well as C-reactive protein have returned to normal ranges. Currently per Dr. Henreitta Leber assessment there was one dehiscence with no sign of osteomyelitis. He therefore discontinue the antibiotics with the completion of the last three days Of doxycycline. Other than that he just set her for a follow-up as needed. The patient does not have diabetes and is not a current smoker. At this time her treatment that was recommended by the surgeon was applying a Hydrocolloid dressing. Based on what I'm seeing at this point the patient actually has some Slough noted over the  surface of the wound there really does not appear to be evidence of infection though I do think she does require some sharp debridement to clear away the slough and help with appropriate wound healing. She does have areas of granulation buds noted. She may be a candidate for a skin substitute as well. We will see how things do over the next period of time until follow-up. No fevers, chills, nausea, or vomiting noted at this time. 06/13/18 on evaluation today patient actually appears to be doing better in regard to her toe ulcer. She's been using the Prisma on this region and that seems to have done extremely well for her. Fortunately there does not appear to be evidence of infection at this time which is good news. She did see her surgeon they were extremely pleased  with the overall appearance of her wound. 06/27/18 on evaluation today patient actually appears to be doing very well in regard to her foot ulcer. She has been tolerating the dressing changes without complication which is great news. She did see her podiatrist they felt like everything is looking very nice as well and will please. 07/05/2018 surgical wound on the left foot. She appears to be doing well. Still surface debrided to remove however in general post debridement the wound bed looks quite healthy it is come down significantly in terms of dimensions using silver collagen. The patient describes pain when her foot is elevated either in bed at night [sometimes keeps her awake] or when is propped up on a foot rest. She basically takes analgesics for this. She does not really describe claudication with activity however her activity is very limited by interstitial lung disease. Her ABIs initially in this clinic were noncompressible. She is not a diabetic 07/20/2018; surgical wound on the left foot. Wound actually looks quite a bit better than I remember seeing this. She is still describing pain with her leg elevated at night that she does  not get when the wound is supine. She does not really describe claudication but she is very limited by her pulmonary status. We have been using silver collagen. 1/17; the patient has been followed up by orthopedics and discharge. Her arterial studies were actually quite good and should not be contributing to any pain. Slight reduction in ABIs but otherwise normal. Her wound is closed. She saw Dr. Linus Salmons of infectious disease surrounding the surgery. By review of our notes she was not felt to have osteomyelitis. 06/03/2022 Ms. [name] is a 77 year old female with a past medical history of interstitial lung disease on chronic oxygen via nasal cannula, rheumatoid arthritis, chronic diastolic heart failure and OSA that presents to the clinic for a 1 to 76-monthhistory of nonhealing ulcer to the left lower extremity. On 04/20/2022 she was admitted to the hospital for left lower extremity cellulitis. She required 8 days of IV vancomycin and Unasyn followed by a 14-day course of Augmentin and doxycycline. She has been using Silvadene cream to the area. She does not use compression therapy but does own compression stockings. She currently denies signs of infection. 11/27; patient presents for follow-up she is using Medihoney and Hydrofera Blue to the wound beds. She has been using her Tubigrip. She has no issues or complaints today. 12/4; patient presents for follow-up. She continues to use Medihoney and Hydrofera Blue to the wound beds. Several attempts have been made to schedule ABIs with TBI's. We gave patient the number today to call to have these scheduled. She has been using Tubigrip. She has no issues or complaints today. 12/18; she continues to use Medihoney and Hydrofera Blue in general the surface of the wound is looking somewhat better this week and measurements are slightly smaller. She did have her arterial studies done these were noncompressible bilaterally however TBI on the right of 0.69  on the left 0.62. Waveforms on the right were triphasic on the left biphasic to triphasic. This does not suggest significant arterial disease to make healing wounds at this location difficult. [name], [name]([id number] Page 7 of 11 12/29; patient presents for follow-up. She has been using Medihoney and Hydrofera Blue to the wound bed. She has been using Tubigrip. 1/5; patient presents for follow-up. She is been using Medihoney and Hydrofera Blue to the wound bed along with Tubigrip. The Tubigrip was  doubled at last clinic visit. There is been improvement in wound healing. 1/12; patient presents for follow-up. She has been using Medihoney and Hydrofera Blue to the wound bed along with Tubigrip. There continues to be improvement in wound healing. Patient History Information obtained from Patient. Family History Cancer - Maternal Grandparents, Diabetes - Siblings, Heart Disease - Maternal Grandparents,Paternal Grandparents,Mother,Father,Siblings, Hypertension - Mother,Father,Siblings, Stroke - Paternal Grandparents, No family history of Hereditary Spherocytosis, Kidney Disease, Lung Disease, Seizures, Thyroid Problems, Tuberculosis. Social History Never smoker, Marital Status - Married, Alcohol Use - Moderate, Drug Use - No History, Caffeine Use - Moderate. Medical History Eyes Denies history of Cataracts, Optic Neuritis Ear/Nose/Mouth/Throat Denies history of Chronic sinus problems/congestion Hematologic/Lymphatic Denies history of Anemia, Hemophilia, Human Immunodeficiency Virus, Lymphedema, Sickle Cell Disease Respiratory Patient has history of Asthma, Sleep Apnea Denies history of Aspiration, Chronic Obstructive Pulmonary Disease (COPD), Pneumothorax, Tuberculosis Cardiovascular Patient has history of Hypertension Denies history of Angina, Arrhythmia, Congestive Heart Failure, Coronary Artery Disease, Deep Vein Thrombosis, Hypotension, Myocardial  Infarction, Peripheral Arterial Disease, Peripheral Venous Disease, Phlebitis, Vasculitis Gastrointestinal Patient has history of Colitis Denies history of Cirrhosis , Crohnoos, Hepatitis A, Hepatitis B, Hepatitis C Endocrine Denies history of Type I Diabetes, Type II Diabetes Immunological Denies history of Lupus Erythematosus, Raynaudoos, Scleroderma Integumentary (Skin) Denies history of History of Burn Musculoskeletal Patient has history of Rheumatoid Arthritis, Osteoarthritis Denies history of Gout, Osteomyelitis Neurologic Denies history of Dementia, Neuropathy, Quadriplegia, Paraplegia, Seizure Disorder Oncologic Denies history of Received Chemotherapy, Received Radiation Psychiatric Denies history of Anorexia/bulimia, Confinement Anxiety Hospitalization/Surgery History - surgery toe. - right hip replacement 12/2018. - heart cath 12/17/2021. Medical A Surgical History Notes nd Respiratory 4L [place] 02 pulmonary fibrosis Objective Constitutional respirations regular, non-labored and within target range for patient.. Vitals Time Taken: 9:14 AM, Height: 61 in, Weight: 186 lbs, BMI: 35.1, Temperature: 98.2 F, Pulse: 102 bpm, Respiratory Rate: 18 breaths/min, Blood Pressure: 143/72 mmHg. Cardiovascular 2+ dorsalis pedis/posterior tibialis pulses. Psychiatric pleasant and cooperative. General Notes: T the lateral aspect of the left lower extremity there are 2 open wounds with granulation tissue and non viable tissue. 2+ pitting edema to the o knee. Obvious deformity to the foot from previous surgeries. No signs of surrounding soft tissue infection. Integumentary (Hair, Skin) Wound #4 status is Open. Original cause of wound was Gradually Appeared. The date acquired was: 04/19/2022. The wound has been in treatment 8 weeks. The wound is located on the Left,Proximal,Anterior Lower Leg. The wound measures 1.9cm length x 1.4cm width x 0.2cm depth; 2.089cm^2 area and 0.418cm^3 [name], [name] ([id number]) [phone].pdf Page 8 of 11 volume. There is Fat Layer (Subcutaneous Tissue) exposed. There is no tunneling or undermining noted. There is a medium amount of serosanguineous drainage noted. The wound margin is distinct with the outline attached to the wound base. There is large (67-100%) pink, pale granulation within the wound bed. There is no necrotic tissue within the wound bed. The periwound skin appearance did not exhibit: Callus, Crepitus, Excoriation, Induration, Rash, Scarring, Dry/Scaly, Maceration, Atrophie Blanche, Cyanosis, Ecchymosis, Hemosiderin Staining, Mottled, Pallor, Rubor, Erythema. Periwound temperature was noted as No Abnormality. The periwound has tenderness on palpation. Wound #5 status is Open. Original cause of wound was Gradually Appeared. The date acquired was: 04/19/2022. The wound has been in treatment 8 weeks. The wound is located on the [facility] Lower Leg. The wound measures 5.5cm length x 3.4cm width x 0.2cm depth; 14.687cm^2 area and 2.937cm^3 volume. There is tendon and Fat Layer (Subcutaneous Tissue) exposed.  There is no tunneling or undermining noted. There is a medium amount of serosanguineous drainage noted. The wound margin is distinct with the outline attached to the wound base. There is large (67-100%) pink, pale granulation within the wound bed. There is a small (1-33%) amount of necrotic tissue within the wound bed including Adherent Slough. The periwound skin appearance did not exhibit: Callus, Crepitus, Excoriation, Induration, Rash, Scarring, Dry/Scaly, Maceration, Atrophie Blanche, Cyanosis, Ecchymosis, Hemosiderin Staining, Mottled, Pallor, Rubor, Erythema. Periwound temperature was noted as No Abnormality. The periwound has tenderness on palpation. 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 I debrided nonviable tissue. I recommended continuing Medihoney and Hydrofera Blue under compression therapy. She has her new juxta lite with her today and I recommended starting to use this, 20-30 mmHg. Continue Tubigrip to the right lower extremity. Follow-up in 1 week. Procedures Wound #4 Pre-procedure diagnosis of Wound #4 is a Cellulitis located on the Left,Proximal,Anterior Lower Leg . There was a Excisional Skin/Subcutaneous Tissue Debridement with a total area of 2.66 sq cm performed by Kalman Shan, DO. With the following instrument(s): Curette to remove Viable and Non-Viable tissue/material. Material removed includes Subcutaneous Tissue, Slough, and Biofilm after achieving pain control using Lidocaine 4% T opical Solution. A time out was conducted at 09:30, prior to the start of the procedure. A Minimum amount of bleeding was controlled with Pressure. The procedure was tolerated well with a pain level of 0 throughout and a pain level of 0 following the procedure. Post Debridement Measurements: 1.9cm length x 1.4cm width x 0.2cm depth; 0.418cm^3 volume. Character of Wound/Ulcer Post Debridement is improved. Post procedure Diagnosis Wound #4: Same as Pre-Procedure Wound #5 Pre-procedure diagnosis of Wound #5 is a Cellulitis located on the Left,Distal,Anterior Lower Leg . There was a Excisional Skin/Subcutaneous Tissue Debridement with a total area of 18.7 sq cm performed by Kalman Shan, DO. With the following instrument(s): Curette to remove Viable and Non-Viable tissue/material. Material removed includes Subcutaneous Tissue, Slough, and Biofilm after achieving pain control using Lidocaine 4% T opical Solution. A time out was conducted at 09:30, prior to  the start of the procedure. A Minimum amount of bleeding was controlled with Pressure. The procedure was tolerated well with a pain level of 0 throughout and a pain level of 0 following the procedure. Post Debridement Measurements: 5.5cm length x 3.4cm width x 0.2cm depth; 2.937cm^3 volume. Character of Wound/Ulcer Post Debridement is improved. Post procedure Diagnosis Wound #5: Same as Pre-Procedure Plan Follow-up Appointments: Return Appointment in 1 week. - Dr. Heber [place] 08/05/2022 1030 Friday Return Appointment in 2 weeks. - Dr. Heber  Thursday 1230 08/11/2022 Anesthetic: (In clinic) Topical Lidocaine 5% applied to wound bed Bathing/ Shower/ Hygiene: May shower with protection but do not get wound dressing(s) wet. Protect dressing(s) with water repellant cover (for example, large plastic bag) or a cast cover and may then take shower. Edema Control - Lymphedema / SCD / Other: Elevate legs to the level of the heart or above for 30 minutes daily and/or when sitting for 3-4 times a day throughout the day. Avoid standing for long periods of time. Exercise regularly Compression stocking or Garment 20-30 mm/Hg pressure to: - start wearing the juxtalite HD apply in the morning and remove at night left leg. WOUND #  4: - Lower Leg Wound Laterality: Left, Anterior, Proximal Cleanser: Wound Cleanser (Generic) 1 x Per Day/30 Days Discharge Instructions: Cleanse the wound with wound cleanser prior to applying a clean dressing using gauze sponges, not tissue or cotton balls. Prim Dressing: MediHoney Gel, tube 1.5 (oz) 1 x Per Day/30 Days ary Discharge Instructions: Apply to wound bed as instructed Prim Dressing: Hydrofera Blue Ready Foam, 4x5 in (Generic) 1 x Per Day/30 Days ary Discharge Instructions: Apply to wound bed as instructed Secondary Dressing: ABD Pad, 8x10 (Generic) 1 x Per Day/30 Days [name], [name] ([id number]) [phone].pdf Page 9 of 11 Discharge Instructions:  Apply over primary dressing as directed. Secured With: [employer], 4.5x3.1 (in/yd) (Generic) 1 x Per Day/30 Days Discharge Instructions: Secure with Kerlix as directed. Secured With: 29M Medipore H Soft Cloth Surgical T ape, 4 x 10 (in/yd) (Generic) 1 x Per Day/30 Days Discharge Instructions: Secure with tape as directed. Com pression Stockings: Circaid Juxta Lite Compression Wrap Compression Amount: 20-30 mmHg (left) Discharge Instructions: Apply Circaid Juxta Lite Compression Wrap daily as instructed. Apply first thing in the morning, remove at night before bed. WOUND #5: - Lower Leg Wound Laterality: Left, Anterior, Distal Cleanser: Wound Cleanser (Generic) 1 x Per Day/30 Days Discharge Instructions: Cleanse the wound with wound cleanser prior to applying a clean dressing using gauze sponges, not tissue or cotton balls. Prim Dressing: MediHoney Gel, tube 1.5 (oz) 1 x Per Day/30 Days ary Discharge Instructions: Apply to wound bed as instructed Prim Dressing: Hydrofera Blue Ready Foam, 4x5 in (Generic) 1 x Per Day/30 Days ary Discharge Instructions: Apply to wound bed as instructed Secondary Dressing: ABD Pad, 8x10 (Generic) 1 x Per Day/30 Days Discharge Instructions: Apply over primary dressing as directed. Secured With: [employer], 4.5x3.1 (in/yd) (Generic) 1 x Per Day/30 Days Discharge Instructions: Secure with Kerlix as directed. Secured With: 29M Medipore H Soft Cloth Surgical T ape, 4 x 10 (in/yd) (Generic) 1 x Per Day/30 Days Discharge Instructions: Secure with tape as directed. 1. In office sharp debridement 2. Medihoney and Hydrofera Blue 3. Juxta light compression daily 4. Follow-up in 1 week Electronic Signature(s) Signed: 07/29/2022 11:36:12 AM By: Kalman Shan DO Entered By: Kalman Shan on 07/29/2022 10:42:01 -------------------------------------------------------------------------------- HxROS Details Patient Name: Date of Service: Hailey Courser, DO  [name] 07/29/2022 8:45 A M Medical Record Number: [id number] Patient Account Number: [id number] Date of Birth/Sex: Treating RN: 13-Dec-1945 (77 y.o. F) Primary Care Provider: Lorene Dy Other Clinician: Referring Provider: Treating Provider/Extender: [name] in Treatment: 8 Information Obtained From Patient Eyes Medical History: Negative for: Cataracts; Optic Neuritis Ear/Nose/Mouth/Throat Medical History: Negative for: Chronic sinus problems/congestion Hematologic/Lymphatic Medical History: Negative for: Anemia; Hemophilia; Human Immunodeficiency Virus; Lymphedema; Sickle Cell Disease Respiratory Medical History: Positive for: Asthma; Sleep Apnea Negative for: Aspiration; Chronic Obstructive Pulmonary Disease (COPD); Pneumothorax; Tuberculosis Past Medical History Notes: 4L [place] 02 pulmonary fibrosis Cardiovascular [name], [name] ([id number]) [id number] Page 10 of 11 Medical History: Positive for: Hypertension Negative for: Angina; Arrhythmia; Congestive Heart Failure; Coronary Artery Disease; Deep Vein Thrombosis; Hypotension; Myocardial Infarction; Peripheral Arterial Disease; Peripheral Venous Disease; Phlebitis; Vasculitis Gastrointestinal Medical History: Positive for: Colitis Negative for: Cirrhosis ; Crohns; Hepatitis A; Hepatitis B; Hepatitis C Endocrine Medical History: Negative for: Type I Diabetes; Type II Diabetes Immunological Medical History: Negative for: Lupus Erythematosus; Raynauds; Scleroderma Integumentary (Skin) Medical History: Negative for: History of Burn Musculoskeletal Medical History: Positive for: Rheumatoid Arthritis; Osteoarthritis Negative for: Gout; Osteomyelitis Neurologic Medical History:  Negative for: Dementia; Neuropathy; Quadriplegia; Paraplegia; Seizure Disorder Oncologic Medical History: Negative for: Received Chemotherapy; Received  Radiation Psychiatric Medical History: Negative for: Anorexia/bulimia; Confinement Anxiety Immunizations Pneumococcal Vaccine: Received Pneumococcal Vaccination: Yes Received Pneumococcal Vaccination On or After 60th Birthday: Yes Implantable Devices No devices added Hospitalization / Surgery History Type of Hospitalization/Surgery surgery toe right hip replacement 12/2018 heart cath 12/17/2021 Family and Social History Cancer: Yes - Maternal Grandparents; Diabetes: Yes - Siblings; Heart Disease: Yes - Maternal Grandparents,Paternal Grandparents,Mother,Father,Siblings; Hereditary Spherocytosis: No; Hypertension: Yes - Mother,Father,Siblings; Kidney Disease: No; Lung Disease: No; Seizures: No; Stroke: Yes - Paternal Grandparents; Thyroid Problems: No; Tuberculosis: No; Never smoker; Marital Status - Married; Alcohol Use: Moderate; Drug Use: No History; Caffeine Use: Moderate; Financial Concerns: No; Food, Clothing or Shelter Needs: No; Support System Lacking: No; Transportation Concerns: No Electronic Signature(s) Signed: 07/29/2022 11:36:12 AM By: Kalman Shan DO Entered By: Kalman Shan on 07/29/2022 10:29:12 [name] ([id number]) [id number] Page 11 of 11 -------------------------------------------------------------------------------- SuperBill Details Patient Name: Date of Service: [name], DO [name] 07/29/2022 Medical Record Number: [id number] Patient Account Number: [id number] Date of Birth/Sex: Treating RN: Feb 06, 1946 (77 y.o. [name], [name] Primary Care Provider: Lorene Dy Other Clinician: Referring Provider: Treating Provider/Extender: [name] in Treatment: 8 Diagnosis Coding ICD-10 Codes Code Description [id number] Non-pressure chronic ulcer of other part of left lower leg with other specified severity I87.312 Chronic venous hypertension (idiopathic) with ulcer of left lower extremity J84.9  Interstitial pulmonary disease, unspecified M06.9 Rheumatoid arthritis, unspecified Facility Procedures : CPT4 Code: 11021117 Description: 35670 - DEB SUBQ TISSUE 20 SQ CM/< ICD-10 Diagnosis Description L97.828 Non-pressure chronic ulcer of other part of left lower leg with other specified Modifier: severity Quantity: 1 : CPT4 Code: 14103013 Description: 14388 - DEB SUBQ TISS EA ADDL 20CM ICD-10 Diagnosis Description L97.828 Non-pressure chronic ulcer of other part of left lower leg with other specified Modifier: severity Quantity: 1 Physician Procedures : CPT4 Code Description Modifier [id number] 11042 - WC PHYS SUBQ TISS 20 SQ CM ICD-10 Diagnosis Description L97.828 Non-pressure chronic ulcer of other part of left lower leg with other specified severity Quantity: 1 : [id number] [id number] - WC PHYS SUBQ TISS EA ADDL 20 CM ICD-10 Diagnosis Description L97.828 Non-pressure chronic ulcer of other part of left lower leg with other specified severity Quantity: 1 Electronic Signature(s) Signed: 07/29/2022 11:36:12 AM By: Kalman Shan DO Entered By: Kalman Shan on 07/29/2022 10:42:08

## 2022-08-02 ENCOUNTER — Other Ambulatory Visit: Payer: Self-pay | Admitting: Internal Medicine

## 2022-08-02 DIAGNOSIS — I2510 Atherosclerotic heart disease of native coronary artery without angina pectoris: Secondary | ICD-10-CM

## 2022-08-05 ENCOUNTER — Encounter (HOSPITAL_BASED_OUTPATIENT_CLINIC_OR_DEPARTMENT_OTHER): Payer: Medicare Other | Admitting: Internal Medicine

## 2022-08-05 DIAGNOSIS — L97828 Non-pressure chronic ulcer of other part of left lower leg with other specified severity: Secondary | ICD-10-CM

## 2022-08-05 DIAGNOSIS — I87312 Chronic venous hypertension (idiopathic) with ulcer of left lower extremity: Secondary | ICD-10-CM

## 2022-08-05 DIAGNOSIS — J849 Interstitial pulmonary disease, unspecified: Secondary | ICD-10-CM

## 2022-08-05 DIAGNOSIS — M069 Rheumatoid arthritis, unspecified: Secondary | ICD-10-CM | POA: Diagnosis not present

## 2022-08-05 NOTE — Progress Notes (Signed)
SHAJUAN, MUSSO ([id number]) [id number] Page 1 of 10 Visit Report for 08/05/2022 Arrival Information Details Patient Name: Date of Service: [name], [name] 08/05/2022 10:30 A M Medical Record Number: [id number] Patient Account Number: [id number] Date of Birth/Sex: Treating RN: 1946-01-09 (77 y.o. F) Primary Care [name]: [name] Other Clinician: Referring [name]: Treating [name]/Extender: [name] in Treatment: 9 Visit Information History Since Last Visit Added or deleted any medications: No Patient Arrived: Wheel Chair Any new allergies or adverse reactions: No Arrival Time: 10:29 Had a fall or experienced change in No Accompanied By: husband activities of daily living that may affect Transfer Assistance: None risk of falls: Patient Identification Verified: Yes Signs or symptoms of abuse/neglect since last visito No Secondary Verification Process Completed: Yes Hospitalized since last visit: No Patient Requires Transmission-Based Precautions: No Implantable device outside of the clinic excluding No Patient Has Alerts: Yes cellular tissue based products placed in the center Patient Alerts: ABI's: L: N/C 11/23 since last visit: Has Dressing in Place as Prescribed: Yes Pain Present Now: No Electronic Signature(s) Signed: 08/05/2022 11:49:43 AM By: [name] Entered By: [name] -------------------------------------------------------------------------------- Clinic Level of Care Assessment Details Patient Name: Date of Service: [name], [name] 08/05/2022 10:30 A M Medical Record Number: [id number] Patient Account Number: [id number] Date of Birth/Sex: Treating RN: 1946/02/21 (77 y.o. [name] Primary Care [name]: [name] Other Clinician: Referring Hadrian Yarbrough: Treating [name]/Extender: [name] in Treatment: 9 Clinic Level  of Care Assessment Items TOOL 4 Quantity Score X- 1 0 Use when only an EandM is performed on FOLLOW-UP visit ASSESSMENTS - Nursing Assessment / Reassessment X- 1 10 Reassessment of Hailey-morbidities (includes updates in patient status) X- 1 5 Reassessment of Adherence to Treatment Plan ASSESSMENTS - Wound and Skin A ssessment / Reassessment _0  - 0 Simple Wound Assessment / Reassessment - one wound X- 2 5 Complex Wound Assessment / Reassessment - multiple wounds X- 1 10 Dermatologic / Skin Assessment (not related to wound area) ASSESSMENTS - Focused Assessment X- 1 5 Circumferential Edema Measurements - multi extremities X- 1 10 Nutritional Assessment / Counseling / Intervention [name], [name] ([id number]) [id number] Page 2 of 10 _1  - 0 Lower Extremity Assessment (monofilament, tuning fork, pulses) _2  - 0 Peripheral Arterial Disease Assessment (using hand held doppler) ASSESSMENTS - Ostomy and/or Continence Assessment and Care _3  - 0 Incontinence Assessment and Management _4  - 0 Ostomy Care Assessment and Management (repouching, etc.) PROCESS - Coordination of Care _5  - 0 Simple Patient / Family Education for ongoing care X- 1 20 Complex (extensive) Patient / Family Education for ongoing care X- 1 10 Staff obtains Programmer, systems, Records, T Results / Process Orders est _6  - 0 Staff telephones HHA, Nursing Homes / Clarify orders / etc _7  - 0 Routine Transfer to another Facility (non-emergent condition) _8  - 0 Routine Hospital Admission (non-emergent condition) _9  - 0 New Admissions / Biomedical engineer / Ordering NPWT Apligraf, etc. , _10  - 0 Emergency Hospital Admission (emergent condition) _11  - 0 Simple Discharge Coordination X- 1 15 Complex (extensive) Discharge Coordination PROCESS - Special Needs _12  - 0 Pediatric / Minor Patient Management _13  - 0 Isolation Patient Management _14  - 0 Hearing / Language / Visual special needs _15  -  0 Assessment of Community assistance (transportation, D/C planning, etc.) _16  - 0 Additional assistance / Altered mentation _17  - 0 Support Surface(s) Assessment (bed, cushion, seat, etc.) INTERVENTIONS -  Wound Cleansing / Measurement _0  - 0 Simple Wound Cleansing - one wound X- 2 5 Complex Wound Cleansing - multiple wounds X- 1 5 Wound Imaging (photographs - any number of wounds) _1  - 0 Wound Tracing (instead of photographs) _2  - 0 Simple Wound Measurement - one wound X- 2 5 Complex Wound Measurement - multiple wounds INTERVENTIONS - Wound Dressings _3  - 0 Small Wound Dressing one or multiple wounds X- 1 15 Medium Wound Dressing one or multiple wounds _4  - 0 Large Wound Dressing one or multiple wounds <BMWUXLKGMWNUUVOZ>_3<\/GUYQIHKVQQVZDGLO>_7  - 0 Application of Medications - topical <FIEPPIRJJOACZYSA>_6<\/TKZSWFUXNATFTDDU>_2  - 0 Application of Medications - injection INTERVENTIONS - Miscellaneous _7  - 0 External ear exam _8  - 0 Specimen Collection (cultures, biopsies, blood, body fluids, etc.) _9  - 0 Specimen(s) / Culture(s) sent or taken to Lab for analysis _10  - 0 Patient Transfer (multiple staff / Civil Service fast streamer / Similar devices) _11  - 0 Simple Staple / Suture removal (25 or less) _12  - 0 Complex Staple / Suture removal (26 or more) _13  - 0 Hypo / Hyperglycemic Management (close monitor of Blood Glucose) [name], [name] ([id number]) [id number] Page 3 of 10 _14  - 0 Ankle / Brachial Index (ABI) - Hailey not check if billed separately X- 1 5 Vital Signs Has the patient been seen at the hospital within the last three years: Yes Total Score: 140 Level Of Care: New/Established - Level 4 Electronic Signature(s) Signed: 08/05/2022 5:13:02 PM By: Deon Pilling RN, BSN Entered By: Deon Pilling on 08/05/2022 11:33:40 -------------------------------------------------------------------------------- Encounter Discharge Information Details Patient Name: Date of Service: [name], [name] 08/05/2022 10:30 A M Medical Record Number:  [id number] Patient Account Number: [id number] Date of Birth/Sex: Treating RN: 1945/08/11 (77 y.o. [name], [name] Primary Care Yomaira Solar: [name] Other Clinician: Referring Raelan Burgoon: Treating Terrie Haring/Extender: [name] in Treatment: 9 Encounter Discharge Information Items Discharge Condition: Stable Ambulatory Status: Wheelchair Discharge Destination: Home Transportation: Private Auto Accompanied By: husband Schedule Follow-up Appointment: Yes Clinical Summary of Care: Electronic Signature(s) Signed: 08/05/2022 5:13:02 PM By: Deon Pilling RN, BSN Entered By: Deon Pilling on 08/05/2022 11:34:15 -------------------------------------------------------------------------------- Lower Extremity Assessment Details Patient Name: Date of Service: [name], [name] 08/05/2022 10:30 A M Medical Record Number: [id number] Patient Account Number: [id number] Date of Birth/Sex: Treating RN: 1945-12-06 (77 y.o. F) Primary Care Dwayne Begay: [name] Other Clinician: Referring Zadok Holaway: Treating Savaya Hakes/Extender: [name] in Treatment: 9 Edema Assessment Assessed: [name]: No] [Right: No] Edema: [Left: Ye] [Right: s] Calf Left: Right: Point of Measurement: 29 cm From Medial Instep 30.3 cm Ankle Left: Right: Point of Measurement: 9 cm From Medial Instep 21.5 cm Electronic Signature(s) Signed: 08/05/2022 11:49:43 AM By: Raye Sorrow, Arvil Persons ([id number]) AM By: [name] [phone].pdf Page 4 of 10 Signed: 08/05/2022 11:49:43 Entered By: [name] on 08/05/2022 10:41:48 -------------------------------------------------------------------------------- Multi Wound Chart Details Patient Name: Date of Service: [name], [name] 08/05/2022 10:30 A M Medical Record Number: [id number] Patient Account Number: [id number] Date of Birth/Sex: Treating RN: [birth year] (77 y.o. F) Primary Care  Tatayana Beshears: [name] Other Clinician: Referring Henya Aguallo: Treating Maleiyah Releford/Extender: [name] in Treatment: 9 Vital Signs Height(in): 61 Pulse(bpm): 95 Weight(lbs): 186 Blood Pressure(mmHg): 131/63 Body Mass Index(BMI): 35.1 Temperature(F): Respiratory Rate(breaths/min): 18 [4:Photos:] [N/A:N/A] Left, Proximal, Anterior Lower Leg Left, Distal, Anterior Lower Leg N/A Wound Location: Gradually Appeared Gradually Appeared N/A Wounding Event: Cellulitis Cellulitis N/A Primary Etiology: Asthma, Sleep Apnea, Hypertension,  Asthma, Sleep Apnea, Hypertension, N/A Comorbid History: Colitis, Rheumatoid Arthritis, Colitis, Rheumatoid Arthritis, Osteoarthritis Osteoarthritis 04/19/2022 04/19/2022 N/A Date Acquired: 9 9 N/A Weeks of Treatment: Open Open N/A Wound Status: No No N/A Wound Recurrence: 1.8x1x0.2 5x3.3x0.2 N/A Measurements L x W x D (cm) 1.414 12.959 N/A A (cm) : rea 0.283 2.592 N/A Volume (cm) : 66.70% 16.70% N/A % Reduction in Area: 77.80% -66.70% N/A % Reduction in Volume: Full Thickness With Exposed Support Full Thickness With Exposed Support N/A Classification: Structures Structures Medium Medium N/A Exudate Amount: Serosanguineous Serosanguineous N/A Exudate Type: red, brown red, brown N/A Exudate Color: Distinct, outline attached Distinct, outline attached N/A Wound Margin: Large (67-100%) Large (67-100%) N/A Granulation Amount: Pink, Pale Pink, Pale N/A Granulation Quality: None Present (0%) Small (1-33%) N/A Necrotic Amount: Fat Layer (Subcutaneous Tissue): Yes Fat Layer (Subcutaneous Tissue): Yes N/A Exposed Structures: Fascia: No Tendon: Yes Tendon: No Fascia: No Muscle: No Muscle: No Joint: No Joint: No Bone: No Bone: No Small (1-33%) Small (1-33%) N/A Epithelialization: Excoriation: No Excoriation: No N/A Periwound Skin Texture: Induration: No Induration: No Callus: No Callus:  No Crepitus: No Crepitus: No Rash: No Rash: No Scarring: No Scarring: No Maceration: No Maceration: No N/A Periwound Skin Moisture: Dry/Scaly: No Dry/Scaly: No Atrophie Blanche: No Atrophie Blanche: No N/A Periwound Skin Color: Cyanosis: No Cyanosis: No Ecchymosis: No Ecchymosis: No Erythema: No Erythema: No [name], [name] ([id number]) [id number] Page 5 of 10 Hemosiderin Staining: No Hemosiderin Staining: No Mottled: No Mottled: No Pallor: No Pallor: No Rubor: No Rubor: No No Abnormality No Abnormality N/A Temperature: Yes Yes N/A Tenderness on Palpation: Treatment Notes Electronic Signature(s) Signed: 08/05/2022 11:32:06 AM By: Kalman Shan Hailey Entered By: Kalman Shan on 08/05/2022 11:29:24 -------------------------------------------------------------------------------- Multi-Disciplinary Care Plan Details Patient Name: Date of Service: [name], [name] 08/05/2022 10:30 A M Medical Record Number: [id number] Patient Account Number: [id number] Date of Birth/Sex: Treating RN: 07-Oct-1945 (77 y.o. [name], [name] Primary Care Charletta Voight: [name] Other Clinician: Referring Xzander Gilham: Treating Kaili Castille/Extender: [name] in Treatment: 9 Active Inactive Pain, Acute or Chronic Nursing Diagnoses: Pain, acute or chronic: actual or potential Potential alteration in comfort, pain Goals: Patient will verbalize adequate pain control and receive pain control interventions during procedures as needed Date Initiated: 06/03/2022 Target Resolution Date: 09/15/2022 Goal Status: Active Patient/caregiver will verbalize comfort level met Date Initiated: 06/03/2022 Target Resolution Date: 09/15/2022 Goal Status: Active Interventions: Encourage patient to take pain medications as prescribed Provide education on pain management Treatment Activities: Administer pain control measures as ordered :  06/03/2022 Notes: Venous Leg Ulcer Nursing Diagnoses: Potential for venous Insuffiency (use before diagnosis confirmed) Goals: Patient will maintain optimal edema control Date Initiated: 06/03/2022 Target Resolution Date: 09/15/2022 Goal Status: Active Interventions: Assess peripheral edema status every visit. Compression as ordered Provide education on venous insufficiency Treatment Activities: Therapeutic compression applied : 06/03/2022 Notes: [name], [name] ([id number]) [phone].pdf Page 6 of 10 Electronic Signature(s) Signed: 08/05/2022 5:13:02 PM By: Deon Pilling RN, BSN Entered By: Deon Pilling on 08/05/2022 11:17:21 -------------------------------------------------------------------------------- Pain Assessment Details Patient Name: Date of Service: [name], [name] 08/05/2022 10:30 A M Medical Record Number: [id number] Patient Account Number: [id number] Date of Birth/Sex: Treating RN: 01/06/1946 (77 y.o. F) Primary Care Han Lysne: [name] Other Clinician: Referring Rainee Sweatt: Treating Brietta Manso/Extender: [name] in Treatment: 9 Active Problems Location of Pain Severity and Description of Pain Patient Has Paino No Site Locations Pain Management and Medication Current Pain  Management: Electronic Signature(s) Signed: 08/05/2022 11:49:43 AM By: [name] Entered By: [name] on 08/05/2022 10:35:07 -------------------------------------------------------------------------------- Patient/Caregiver Education Details Patient Name: Date of Service: [name], Hailey RO Marry Guan 1/19/2024andnbsp10:30 A M Medical Record Number: [id number] Patient Account Number: [id number] Date of Birth/Gender: Treating RN: 28-Nov-1945 (77 y.o. [name], [name] Primary Care Physician: [name] Other Clinician: Referring Physician: Treating Physician/Extender: [name] in Treatment:  9 Education Assessment Education Provided To: [name] ([id number]) [id number] Page 7 of 10 Patient Education Topics Provided Wound/Skin Impairment: Handouts: Caring for Your Ulcer Methods: Explain/Verbal Responses: Reinforcements needed Electronic Signature(s) Signed: 08/05/2022 5:13:02 PM By: Deon Pilling RN, BSN Entered By: Deon Pilling on 08/05/2022 11:17:34 -------------------------------------------------------------------------------- Wound Assessment Details Patient Name: Date of Service: [name], [name] 08/05/2022 10:30 A M Medical Record Number: [id number] Patient Account Number: [id number] Date of Birth/Sex: Treating RN: 1946/03/13 (77 y.o. F) Primary Care Duff Pozzi: [name] Other Clinician: Referring Jaquala Fuller: Treating Telsa Dillavou/Extender: [name] in Treatment: 9 Wound Status Wound Number: 4 Primary Cellulitis Etiology: Wound Location: Left, Proximal, Anterior Lower Leg Wound Open Wounding Event: Gradually Appeared Status: Date Acquired: 04/19/2022 Comorbid Asthma, Sleep Apnea, Hypertension, Colitis, Rheumatoid Weeks Of Treatment: 9 History: Arthritis, Osteoarthritis Clustered Wound: No Photos Wound Measurements Length: (cm) 1.8 Width: (cm) 1 Depth: (cm) 0.2 Area: (cm) 1.414 Volume: (cm) 0.283 % Reduction in Area: 66.7% % Reduction in Volume: 77.8% Epithelialization: Small (1-33%) Tunneling: No Undermining: No Wound Description Classification: Full Thickness With Exposed Suppor Wound Margin: Distinct, outline attached Exudate Amount: Medium Exudate Type: Serosanguineous Exudate Color: red, brown t Structures Foul Odor After Cleansing: No Slough/Fibrino Yes Wound Bed Granulation Amount: Large (67-100%) Exposed Structure Granulation Quality: Pink, Pale Fascia Exposed: No Necrotic Amount: None Present (0%) Fat Layer (Subcutaneous Tissue) Exposed: Yes Tendon Exposed:  No Muscle Exposed: No Joint Exposed: No [name], [name] ([id number]) [id number] Page 8 of 10 Bone Exposed: No Periwound Skin Texture Texture Color No Abnormalities Noted: No No Abnormalities Noted: No Callus: No Atrophie Blanche: No Crepitus: No Cyanosis: No Excoriation: No Ecchymosis: No Induration: No Erythema: No Rash: No Hemosiderin Staining: No Scarring: No Mottled: No Pallor: No Moisture Rubor: No No Abnormalities Noted: No Dry / Scaly: No Temperature / Pain Maceration: No Temperature: No Abnormality Tenderness on Palpation: Yes Treatment Notes Wound #4 (Lower Leg) Wound Laterality: Left, Anterior, Proximal Cleanser Wound Cleanser Discharge Instruction: Cleanse the wound with wound cleanser prior to applying a clean dressing using gauze sponges, not tissue or cotton balls. Peri-Wound Care Topical blastx Discharge Instruction: apply a layer to wound bed. Primary Dressing Fibracol Plus Dressing, 4x4.38 in (collagen) Discharge Instruction: Moisten collagen over the collagen. Secondary Dressing ABD Pad, 8x10 Discharge Instruction: Apply over primary dressing as directed. Secured With [employer], 4.5x3.1 (in/yd) Discharge Instruction: Secure with Kerlix as directed. 3M Medipore H Soft Cloth Surgical T ape, 4 x 10 (in/yd) Discharge Instruction: Secure with tape as directed. Compression Wrap Compression Stockings Circaid Juxta Lite Compression Wrap Quantity: 1 Left Leg Compression Amount: 20-30 mmHg Discharge Instruction: Apply Circaid Juxta Lite Compression Wrap daily as instructed. Apply first thing in the morning, remove at night before bed. Add-Ons Electronic Signature(s) Signed: 08/05/2022 11:49:43 AM By: [name] Entered By: [name] on 08/05/2022 10:45:40 -------------------------------------------------------------------------------- Wound Assessment Details Patient Name: Date of Service: [name], Hailey RO  THY S. 08/05/2022 10:30 A M Medical Record Number: [id number] Patient Account Number: [id number] Date of Birth/Sex: Treating RN: 23-Jun-1946 (  77 y.o. F) Primary Care Maico Mulvehill: [name] Other Clinician: Referring Dee Paden: Treating Charyl Minervini/Extender: Rushie Chestnut [place], [place] ([id number]) [id number] Page 9 of 10 Weeks in Treatment: 9 Wound Status Wound Number: 5 Primary Cellulitis Etiology: Wound Location: Left, Distal, Anterior Lower Leg Wound Open Wounding Event: Gradually Appeared Status: Date Acquired: 04/19/2022 Comorbid Asthma, Sleep Apnea, Hypertension, Colitis, Rheumatoid Weeks Of Treatment: 9 History: Arthritis, Osteoarthritis Clustered Wound: No Photos Wound Measurements Length: (cm) 5 Width: (cm) 3.3 Depth: (cm) 0.2 Area: (cm) 12.959 Volume: (cm) 2.592 % Reduction in Area: 16.7% % Reduction in Volume: -66.7% Epithelialization: Small (1-33%) Tunneling: No Undermining: No Wound Description Classification: Full Thickness With Exposed Support Structures Wound Margin: Distinct, outline attached Exudate Amount: Medium Exudate Type: Serosanguineous Exudate Color: red, brown Foul Odor After Cleansing: No Slough/Fibrino Yes Wound Bed Granulation Amount: Large (67-100%) Exposed Structure Granulation Quality: Pink, Pale Fascia Exposed: No Necrotic Amount: Small (1-33%) Fat Layer (Subcutaneous Tissue) Exposed: Yes Necrotic Quality: Adherent Slough Tendon Exposed: Yes Muscle Exposed: No Joint Exposed: No Bone Exposed: No Periwound Skin Texture Texture Color No Abnormalities Noted: No No Abnormalities Noted: No Callus: No Atrophie Blanche: No Crepitus: No Cyanosis: No Excoriation: No Ecchymosis: No Induration: No Erythema: No Rash: No Hemosiderin Staining: No Scarring: No Mottled: No Pallor: No Moisture Rubor: No No Abnormalities Noted: No Dry / Scaly: No Temperature / Pain Maceration:  No Temperature: No Abnormality Tenderness on Palpation: Yes Treatment Notes Wound #5 (Lower Leg) Wound Laterality: Left, Anterior, Distal Cleanser Wound Cleanser Discharge Instruction: Cleanse the wound with wound cleanser prior to applying a clean dressing using gauze sponges, not tissue or cotton balls. Peri-Wound Care Topical [name], [name] ([id number]) [id number] Page 10 of 10 blastx Discharge Instruction: apply a layer to wound bed. Primary Dressing Fibracol Plus Dressing, 4x4.38 in (collagen) Discharge Instruction: Moisten collagen over the collagen. Secondary Dressing ABD Pad, 8x10 Discharge Instruction: Apply over primary dressing as directed. Secured With [employer], 4.5x3.1 (in/yd) Discharge Instruction: Secure with Kerlix as directed. 52M Medipore H Soft Cloth Surgical T ape, 4 x 10 (in/yd) Discharge Instruction: Secure with tape as directed. Compression Wrap Compression Stockings Add-Ons Electronic Signature(s) Signed: 08/05/2022 11:49:43 AM By: [name] Entered By: [name] on 08/05/2022 10:46:12 -------------------------------------------------------------------------------- Vitals Details Patient Name: Date of Service: [name], [name] 08/05/2022 10:30 A M Medical Record Number: [id number] Patient Account Number: [id number] Date of Birth/Sex: Treating RN: 05-11-1946 (77 y.o. F) Primary Care Kirston Luty: [name] Other Clinician: Referring Kyarra Vancamp: Treating Brenton Joines/Extender: [name] in Treatment: 9 Vital Signs Time Taken: 10:34 Pulse (bpm): 95 Height (in): 61 Respiratory Rate (breaths/min): 18 Weight (lbs): 186 Blood Pressure (mmHg): 131/63 Body Mass Index (BMI): 35.1 Reference Range: 80 - 120 mg / dl Electronic Signature(s) Signed: 08/05/2022 11:49:43 AM By: [name] Entered By: [name] on 08/05/2022 10:34:40

## 2022-08-05 NOTE — Progress Notes (Addendum)
Hailey, [name] ([id number]) [id number] Page 1 of 9 Visit Report for 08/05/2022 Chief Complaint Document Details Patient Name: Date of Service: Hailey Courser, DO [name] 08/05/2022 10:30 A M Medical Record Number: [id number] Patient Account Number: [id number] Date of Birth/Sex: Treating RN: 1946-03-01 (77 y.o. F) Primary Care Provider: Lorene Dy Other Clinician: Referring Provider: Treating Provider/Extender: [name] in Treatment: 9 Information Obtained from: Patient Chief Complaint 06/03/2022; Left lower extremity wounds Electronic Signature(s) Signed: 08/05/2022 11:32:06 AM By: Kalman Shan DO Entered By: Kalman Shan on 08/05/2022 11:29:31 -------------------------------------------------------------------------------- HPI Details Patient Name: Date of Service: Hailey Courser, DO [name] 08/05/2022 10:30 A M Medical Record Number: [id number] Patient Account Number: [id number] Date of Birth/Sex: Treating RN: August 27, 1945 (77 y.o. F) Primary Care Provider: Lorene Dy Other Clinician: Referring Provider: Treating Provider/Extender: [name] in Treatment: 9 History of Present Illness HPI Description: Patient presents today for initial evaluation and our clinic as a referral from the [facility] Department of orthopedics for evaluation and treatment of the wound to his his of the left foot at the base of the great toe. Currently the good news is the patient really does not have any significant pain which is excellent. She has been seen by Dr. Linus Salmons at [facility]: infectious disease clinic that is the regional Center for infectious disease. Subsequently the patient was cultured and positive for methicillin sensitive staph aureus. She has been on antibiotics including Cipro prior to surgery as will several days following surgery. She then had clindamycin which was changed to  doxycycline. She took this for 10 days. Subsequently the pain had improved and there was no longer any possible draining from the wound according to the notes. The patient sedentary rate as well as C-reactive protein have returned to normal ranges. Currently per Dr. Henreitta Leber assessment there was one dehiscence with no sign of osteomyelitis. He therefore discontinue the antibiotics with the completion of the last three days Of doxycycline. Other than that he just set her for a follow-up as needed. The patient does not have diabetes and is not a current smoker. At this time her treatment that was recommended by the surgeon was applying a Hydrocolloid dressing. Based on what I'm seeing at this point the patient actually has some Slough noted over the surface of the wound there really does not appear to be evidence of infection though I do think she does require some sharp debridement to clear away the slough and help with appropriate wound healing. She does have areas of granulation buds noted. She may be a candidate for a skin substitute as well. We will see how things do over the next period of time until follow-up. No fevers, chills, nausea, or vomiting noted at this time. 06/13/18 on evaluation today patient actually appears to be doing better in regard to her toe ulcer. She's been using the Prisma on this region and that seems to have done extremely well for her. Fortunately there does not appear to be evidence of infection at this time which is good news. She did see her surgeon they were extremely pleased with the overall appearance of her wound. 06/27/18 on evaluation today patient actually appears to be doing very well in regard to her foot ulcer. She has been tolerating the dressing changes without complication which is great news. She did see her podiatrist they felt like everything is looking very nice as well and will please. 07/05/2018 surgical wound on  the left foot. She appears to be doing  well. Still surface debrided to remove however in general post debridement the wound bed looks quite healthy it is come down significantly in terms of dimensions using silver collagen. The patient describes pain when her foot is elevated either in bed at night [sometimes keeps her awake] or when is propped up on a foot rest. She basically takes analgesics for this. She does not really describe claudication with activity however her activity is very limited by interstitial lung disease. Her ABIs initially in this clinic were noncompressible. She is not a diabetic 07/20/2018; surgical wound on the left foot. Wound actually looks quite a bit better than I remember seeing this. She is still describing pain with her leg elevated at night that she does not get when the wound is supine. She does not really describe claudication but she is very limited by her pulmonary status. We have been using silver collagen. 1/17; the patient has been followed up by orthopedics and discharge. Her arterial studies were actually quite good and should not be contributing to any pain. Slight reduction in ABIs but otherwise normal. Her wound is closed. She saw Dr. Linus Salmons of infectious disease surrounding the surgery. By review of our notes ANNALEESE, GUIER ([id number]) [id number] Page 2 of 9 she was not felt to have osteomyelitis. 06/03/2022 Ms. [name] is a 77 year old female with a past medical history of interstitial lung disease on chronic oxygen via nasal cannula, rheumatoid arthritis, chronic diastolic heart failure and OSA that presents to the clinic for a 1 to 63-monthhistory of nonhealing ulcer to the left lower extremity. On 04/20/2022 she was admitted to the hospital for left lower extremity cellulitis. She required 8 days of IV vancomycin and Unasyn followed by a 14-day course of Augmentin and doxycycline. She has been using Silvadene cream to the area. She does not use compression  therapy but does own compression stockings. She currently denies signs of infection. 11/27; patient presents for follow-up she is using Medihoney and Hydrofera Blue to the wound beds. She has been using her Tubigrip. She has no issues or complaints today. 12/4; patient presents for follow-up. She continues to use Medihoney and Hydrofera Blue to the wound beds. Several attempts have been made to schedule ABIs with TBI's. We gave patient the number today to call to have these scheduled. She has been using Tubigrip. She has no issues or complaints today. 12/18; she continues to use Medihoney and Hydrofera Blue in general the surface of the wound is looking somewhat better this week and measurements are slightly smaller. She did have her arterial studies done these were noncompressible bilaterally however TBI on the right of 0.69 on the left 0.62. Waveforms on the right were triphasic on the left biphasic to triphasic. This does not suggest significant arterial disease to make healing wounds at this location difficult. 12/29; patient presents for follow-up. She has been using Medihoney and Hydrofera Blue to the wound bed. She has been using Tubigrip. 1/5; patient presents for follow-up. She is been using Medihoney and Hydrofera Blue to the wound bed along with Tubigrip. The Tubigrip was doubled at last clinic visit. There is been improvement in wound healing. 1/12; patient presents for follow-up. She has been using Medihoney and Hydrofera Blue to the wound bed along with Tubigrip. There continues to be improvement in wound healing. 1/19; patient presents for follow-up. She has been using Medihoney and Hydrofera Blue to the wound bed along with her  juxta light compression daily. There is been improvement in wound measurements. Patient has no issues or complaints today. Electronic Signature(s) Signed: 08/05/2022 11:32:06 AM By: Kalman Shan DO Entered By: Kalman Shan on 08/05/2022  11:30:02 -------------------------------------------------------------------------------- Physical Exam Details Patient Name: Date of Service: Hailey Courser, DO [name] 08/05/2022 10:30 A M Medical Record Number: [id number] Patient Account Number: [id number] Date of Birth/Sex: Treating RN: 1946/04/28 (77 y.o. F) Primary Care Provider: Lorene Dy Other Clinician: Referring Provider: Treating Provider/Extender: [name] in Treatment: 9 Constitutional respirations regular, non-labored and within target range for patient.. Cardiovascular 2+ dorsalis pedis/posterior tibialis pulses. Psychiatric pleasant and cooperative. Notes T the lateral aspect of the left lower extremity there are 2 open wounds with granulation tissue and non viable tissue. Wound bed appears dry. Good edema o control. Obvious deformity to the foot from previous surgeries. No signs of surrounding soft tissue infection. Electronic Signature(s) Signed: 08/05/2022 11:32:06 AM By: Kalman Shan DO Entered By: Kalman Shan on 08/05/2022 11:30:40 -------------------------------------------------------------------------------- Physician Orders Details Patient Name: Date of Service: Hailey Courser, DO [name] 08/05/2022 10:30 A Linwood Dibbles, Arvil Persons ([id number]) [id number] Page 3 of 9 Medical Record Number: [id number] Patient Account Number: [id number] Date of Birth/Sex: Treating RN: 1945-08-25 (77 y.o. [name], [name] Primary Care Provider: Lorene Dy Other Clinician: Referring Provider: Treating Provider/Extender: [name] in Treatment: 9 Verbal / Phone Orders: No Diagnosis Coding ICD-10 Coding Code Description [phone] Non-pressure chronic ulcer of other part of left lower leg with other specified severity I87.312 Chronic venous hypertension (idiopathic) with ulcer of left lower extremity J84.9 Interstitial pulmonary disease,  unspecified M06.9 Rheumatoid arthritis, unspecified Follow-up Appointments ppointment in 1 week. - Dr. Heber [place] 1230 08/11/2022 Return A ppointment in 2 weeks. - Dr. Heber Fallis Thursday 08/18/2022 130pm Return A Other: - [employer] will call you a copay for blastx and collagen. IF too expensive use only medihoney to leg wounds. Anesthetic (In clinic) Topical Lidocaine 5% applied to wound bed Bathing/ Shower/ Hygiene May shower with protection but do not get wound dressing(s) wet. Protect dressing(s) with water repellant cover (for example, large plastic bag) or a cast cover and may then take shower. Edema Control - Lymphedema / SCD / Other Elevate legs to the level of the heart or above for 30 minutes daily and/or when sitting for 3-4 times a day throughout the day. Avoid standing for long periods of time. Exercise regularly Compression stocking or Garment 20-30 mm/Hg pressure to: - start wearing the juxtalite HD apply in the morning and remove at night left leg. Non Wound Condition Protect area with: - buttock area with AandD ointment daily Wound Treatment Wound #4 - Lower Leg Wound Laterality: Left, Anterior, Proximal Cleanser: Wound Cleanser (Generic) 1 x Per Day/30 Days Discharge Instructions: Cleanse the wound with wound cleanser prior to applying a clean dressing using gauze sponges, not tissue or cotton balls. Topical: blastx 1 x Per Day/30 Days Discharge Instructions: apply a layer to wound bed. Prim Dressing: Fibracol Plus Dressing, 4x4.38 in (collagen) 1 x Per Day/30 Days ary Discharge Instructions: Moisten collagen over the collagen. Secondary Dressing: ABD Pad, 8x10 (Generic) 1 x Per Day/30 Days Discharge Instructions: Apply over primary dressing as directed. Secured With: [employer], 4.5x3.1 (in/yd) (Generic) 1 x Per Day/30 Days Discharge Instructions: Secure with Kerlix as directed. Secured With: 29M Medipore H Soft Cloth Surgical T ape, 4 x 10 (in/yd) (Generic) 1 x  Per Day/30 Days Discharge Instructions: Secure  with tape as directed. Compression Stockings: Circaid Juxta Lite Compression Wrap Left Leg Compression Amount: 20-30 mmHG Discharge Instructions: Apply Circaid Juxta Lite Compression Wrap daily as instructed. Apply first thing in the morning, remove at night before bed. Wound #5 - Lower Leg Wound Laterality: Left, Anterior, Distal Cleanser: Wound Cleanser (Generic) 1 x Per Day/30 Days Discharge Instructions: Cleanse the wound with wound cleanser prior to applying a clean dressing using gauze sponges, not tissue or cotton balls. Topical: blastx 1 x Per Day/30 Days Discharge Instructions: apply a layer to wound bed. Prim Dressing: Fibracol Plus Dressing, 4x4.38 in (collagen) 1 x Per Day/30 Days ary Discharge Instructions: Moisten collagen over the collagen. Secondary Dressing: ABD Pad, 8x10 (Generic) 1 x Per Day/30 Days Discharge Instructions: Apply over primary dressing as directed. Secured With: [employer], 4.5x3.1 (in/yd) (Generic) 1 x Per Day/30 Days CRYTAL, PENSINGER ([id number]) [id number] Page 4 of 9 Discharge Instructions: Secure with Kerlix as directed. Secured With: 27M Medipore H Soft Cloth Surgical T ape, 4 x 10 (in/yd) (Generic) 1 x Per Day/30 Days Discharge Instructions: Secure with tape as directed. Electronic Signature(s) Signed: 08/05/2022 11:32:06 AM By: Kalman Shan DO Entered By: Kalman Shan on 08/05/2022 11:30:47 -------------------------------------------------------------------------------- Problem List Details Patient Name: Date of Service: Hailey Courser, DO [name] 08/05/2022 10:30 A M Medical Record Number: [id number] Patient Account Number: [id number] Date of Birth/Sex: Treating RN: [birth year] (77 y.o. [name], [name] Primary Care Provider: Lorene Dy Other Clinician: Referring Provider: Treating Provider/Extender: [name] in Treatment:  9 Active Problems ICD-10 Encounter Code Description Active Date MDM Diagnosis L97.828 Non-pressure chronic ulcer of other part of left lower leg with other specified 06/03/2022 No Yes severity I87.312 Chronic venous hypertension (idiopathic) with ulcer of left lower extremity 06/03/2022 No Yes J84.9 Interstitial pulmonary disease, unspecified 06/03/2022 No Yes M06.9 Rheumatoid arthritis, unspecified 06/03/2022 No Yes Inactive Problems Resolved Problems Electronic Signature(s) Signed: 08/05/2022 11:32:06 AM By: Kalman Shan DO Entered By: Kalman Shan on 08/05/2022 11:29:20 -------------------------------------------------------------------------------- Progress Note Details Patient Name: Date of Service: Hailey Courser, DO [name] 08/05/2022 10:30 A M Medical Record Number: [id number] Patient Account Number: [id number] Date of Birth/Sex: Treating RN: Apr 30, 1946 (77 y.o. F) Primary Care Provider: Lorene Dy Other Clinician: Referring Provider: Treating Provider/Extender: Rushie Chestnut [place], Arvil Persons ([id number]) [id number] Page 5 of 9 Weeks in Treatment: 9 Subjective Chief Complaint Information obtained from Patient 06/03/2022; Left lower extremity wounds History of Present Illness (HPI) Patient presents today for initial evaluation and our clinic as a referral from the [place] system Department of orthopedics for evaluation and treatment of the wound to his his of the left foot at the base of the great toe. Currently the good news is the patient really does not have any significant pain which is excellent. She has been seen by Dr. Linus Salmons at [facility]: infectious disease clinic that is the regional Center for infectious disease. Subsequently the patient was cultured and positive for methicillin sensitive staph aureus. She has been on antibiotics including Cipro prior to surgery as will several days following surgery.  She then had clindamycin which was changed to doxycycline. She took this for 10 days. Subsequently the pain had improved and there was no longer any possible draining from the wound according to the notes. The patient sedentary rate as well as C-reactive protein have returned to normal ranges. Currently per Dr. Henreitta Leber assessment there was one dehiscence with no sign of osteomyelitis. He therefore  discontinue the antibiotics with the completion of the last three days Of doxycycline. Other than that he just set her for a follow-up as needed. The patient does not have diabetes and is not a current smoker. At this time her treatment that was recommended by the surgeon was applying a Hydrocolloid dressing. Based on what I'm seeing at this point the patient actually has some Slough noted over the surface of the wound there really does not appear to be evidence of infection though I do think she does require some sharp debridement to clear away the slough and help with appropriate wound healing. She does have areas of granulation buds noted. She may be a candidate for a skin substitute as well. We will see how things do over the next period of time until follow-up. No fevers, chills, nausea, or vomiting noted at this time. 06/13/18 on evaluation today patient actually appears to be doing better in regard to her toe ulcer. She's been using the Prisma on this region and that seems to have done extremely well for her. Fortunately there does not appear to be evidence of infection at this time which is good news. She did see her surgeon they were extremely pleased with the overall appearance of her wound. 06/27/18 on evaluation today patient actually appears to be doing very well in regard to her foot ulcer. She has been tolerating the dressing changes without complication which is great news. She did see her podiatrist they felt like everything is looking very nice as well and will please. 07/05/2018 surgical  wound on the left foot. She appears to be doing well. Still surface debrided to remove however in general post debridement the wound bed looks quite healthy it is come down significantly in terms of dimensions using silver collagen. The patient describes pain when her foot is elevated either in bed at night [sometimes keeps her awake] or when is propped up on a foot rest. She basically takes analgesics for this. She does not really describe claudication with activity however her activity is very limited by interstitial lung disease. Her ABIs initially in this clinic were noncompressible. She is not a diabetic 07/20/2018; surgical wound on the left foot. Wound actually looks quite a bit better than I remember seeing this. She is still describing pain with her leg elevated at night that she does not get when the wound is supine. She does not really describe claudication but she is very limited by her pulmonary status. We have been using silver collagen. 1/17; the patient has been followed up by orthopedics and discharge. Her arterial studies were actually quite good and should not be contributing to any pain. Slight reduction in ABIs but otherwise normal. Her wound is closed. She saw Dr. Linus Salmons of infectious disease surrounding the surgery. By review of our notes she was not felt to have osteomyelitis. 06/03/2022 Ms. [name] is a 77 year old female with a past medical history of interstitial lung disease on chronic oxygen via nasal cannula, rheumatoid arthritis, chronic diastolic heart failure and OSA that presents to the clinic for a 1 to 29-monthhistory of nonhealing ulcer to the left lower extremity. On 04/20/2022 she was admitted to the hospital for left lower extremity cellulitis. She required 8 days of IV vancomycin and Unasyn followed by a 14-day course of Augmentin and doxycycline. She has been using Silvadene cream to the area. She does not use compression therapy but does own compression  stockings. She currently denies signs of infection. 11/27;  patient presents for follow-up she is using Medihoney and Hydrofera Blue to the wound beds. She has been using her Tubigrip. She has no issues or complaints today. 12/4; patient presents for follow-up. She continues to use Medihoney and Hydrofera Blue to the wound beds. Several attempts have been made to schedule ABIs with TBI's. We gave patient the number today to call to have these scheduled. She has been using Tubigrip. She has no issues or complaints today. 12/18; she continues to use Medihoney and Hydrofera Blue in general the surface of the wound is looking somewhat better this week and measurements are slightly smaller. She did have her arterial studies done these were noncompressible bilaterally however TBI on the right of 0.69 on the left 0.62. Waveforms on the right were triphasic on the left biphasic to triphasic. This does not suggest significant arterial disease to make healing wounds at this location difficult. 12/29; patient presents for follow-up. She has been using Medihoney and Hydrofera Blue to the wound bed. She has been using Tubigrip. 1/5; patient presents for follow-up. She is been using Medihoney and Hydrofera Blue to the wound bed along with Tubigrip. The Tubigrip was doubled at last clinic visit. There is been improvement in wound healing. 1/12; patient presents for follow-up. She has been using Medihoney and Hydrofera Blue to the wound bed along with Tubigrip. There continues to be improvement in wound healing. 1/19; patient presents for follow-up. She has been using Medihoney and Hydrofera Blue to the wound bed along with her juxta light compression daily. There is been improvement in wound measurements. Patient has no issues or complaints today. Patient History Information obtained from Patient. Family History Cancer - Maternal Grandparents, Diabetes - Siblings, Heart Disease - Maternal Grandparents,Paternal  Grandparents,Mother,Father,Siblings, Hypertension - Mother,Father,Siblings, Stroke - Paternal Grandparents, No family history of Hereditary Spherocytosis, Kidney Disease, Lung Disease, Seizures, Thyroid Problems, Tuberculosis. Social History Never smoker, Marital Status - Married, Alcohol Use - Moderate, Drug Use - No History, Caffeine Use - Moderate. Medical History Eyes Denies history of Cataracts, Optic Neuritis Ear/Nose/Mouth/Throat Denies history of Chronic sinus problems/congestion Hematologic/Lymphatic Denies history of Anemia, Hemophilia, Human Immunodeficiency Virus, Lymphedema, Sickle Cell Disease Respiratory RAELAN, BURGOON ([id number]) [id number] Page 6 of 9 Patient has history of Asthma, Sleep Apnea Denies history of Aspiration, Chronic Obstructive Pulmonary Disease (COPD), Pneumothorax, Tuberculosis Cardiovascular Patient has history of Hypertension Denies history of Angina, Arrhythmia, Congestive Heart Failure, Coronary Artery Disease, Deep Vein Thrombosis, Hypotension, Myocardial Infarction, Peripheral Arterial Disease, Peripheral Venous Disease, Phlebitis, Vasculitis Gastrointestinal Patient has history of Colitis Denies history of Cirrhosis , Crohnoos, Hepatitis A, Hepatitis B, Hepatitis C Endocrine Denies history of Type I Diabetes, Type II Diabetes Immunological Denies history of Lupus Erythematosus, Raynaudoos, Scleroderma Integumentary (Skin) Denies history of History of Burn Musculoskeletal Patient has history of Rheumatoid Arthritis, Osteoarthritis Denies history of Gout, Osteomyelitis Neurologic Denies history of Dementia, Neuropathy, Quadriplegia, Paraplegia, Seizure Disorder Oncologic Denies history of Received Chemotherapy, Received Radiation Psychiatric Denies history of Anorexia/bulimia, Confinement Anxiety Hospitalization/Surgery History - surgery toe. - right hip replacement 12/2018. - heart cath 12/17/2021. Medical A  Surgical History Notes nd Respiratory 4L [place] 02 pulmonary fibrosis Objective Constitutional respirations regular, non-labored and within target range for patient.. Vitals Time Taken: 10:34 AM, Height: 61 in, Weight: 186 lbs, BMI: 35.1, Pulse: 95 bpm, Respiratory Rate: 18 breaths/min, Blood Pressure: 131/63 mmHg. Cardiovascular 2+ dorsalis pedis/posterior tibialis pulses. Psychiatric pleasant and cooperative. General Notes: T the lateral aspect of the left lower extremity there are 2 open wounds  with granulation tissue and non viable tissue. Wound bed appears dry. o Good edema control. Obvious deformity to the foot from previous surgeries. No signs of surrounding soft tissue infection. Integumentary (Hair, Skin) Wound #4 status is Open. Original cause of wound was Gradually Appeared. The date acquired was: 04/19/2022. The wound has been in treatment 9 weeks. The wound is located on the Left,Proximal,Anterior Lower Leg. The wound measures 1.8cm length x 1cm width x 0.2cm depth; 1.414cm^2 area and 0.283cm^3 volume. There is Fat Layer (Subcutaneous Tissue) exposed. There is no tunneling or undermining noted. There is a medium amount of serosanguineous drainage noted. The wound margin is distinct with the outline attached to the wound base. There is large (67-100%) pink, pale granulation within the wound bed. There is no necrotic tissue within the wound bed. The periwound skin appearance did not exhibit: Callus, Crepitus, Excoriation, Induration, Rash, Scarring, Dry/Scaly, Maceration, Atrophie Blanche, Cyanosis, Ecchymosis, Hemosiderin Staining, Mottled, Pallor, Rubor, Erythema. Periwound temperature was noted as No Abnormality. The periwound has tenderness on palpation. Wound #5 status is Open. Original cause of wound was Gradually Appeared. The date acquired was: 04/19/2022. The wound has been in treatment 9 weeks. The wound is located on the [facility] Lower Leg. The wound measures 5cm  length x 3.3cm width x 0.2cm depth; 12.959cm^2 area and 2.592cm^3 volume. There is tendon and Fat Layer (Subcutaneous Tissue) exposed. There is no tunneling or undermining noted. There is a medium amount of serosanguineous drainage noted. The wound margin is distinct with the outline attached to the wound base. There is large (67-100%) pink, pale granulation within the wound bed. There is a small (1-33%) amount of necrotic tissue within the wound bed including Adherent Slough. The periwound skin appearance did not exhibit: Callus, Crepitus, Excoriation, Induration, Rash, Scarring, Dry/Scaly, Maceration, Atrophie Blanche, Cyanosis, Ecchymosis, Hemosiderin Staining, Mottled, Pallor, Rubor, Erythema. Periwound temperature was noted as No Abnormality. The periwound has tenderness on palpation. 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 The wound bed appears dry and I recommended stopping Medihoney and Hydrofera Blue and starting blast X and collagen. Continue juxta light compression. Follow-up in 1 week. Plan Follow-up Appointments: Return Appointment in 1 week. - Dr. Heber Farnam 1230 08/11/2022 Return Appointment in 2 weeks. - Dr. Heber Salina Thursday 08/18/2022 130pm Other: - [employer] will call you a copay for blastx and collagen. IF too expensive use only medihoney to leg wounds. Anesthetic: (In clinic) Topical Lidocaine 5% applied to wound bed Bathing/ Shower/ Hygiene: May shower with protection but do not get wound dressing(s) wet. Protect dressing(s) with water repellant cover (for example, large plastic bag) or a  cast cover and may then take shower. Edema Control - Lymphedema / SCD / Other: Elevate legs to the level of the heart or above for 30 minutes daily and/or when sitting for 3-4 times a day throughout the day. Avoid standing for long periods of time. Exercise regularly Compression stocking or Garment 20-30 mm/Hg pressure to: - start wearing the juxtalite HD apply in the morning and remove at night left leg. Non Wound Condition: Protect area with: - buttock area with AandD ointment daily WOUND #4: - Lower Leg Wound Laterality: Left, Anterior, Proximal Cleanser: Wound Cleanser (Generic) 1 x Per  Day/30 Days Discharge Instructions: Cleanse the wound with wound cleanser prior to applying a clean dressing using gauze sponges, not tissue or cotton balls. Topical: blastx 1 x Per Day/30 Days Discharge Instructions: apply a layer to wound bed. Prim Dressing: Fibracol Plus Dressing, 4x4.38 in (collagen) 1 x Per Day/30 Days ary Discharge Instructions: Moisten collagen over the collagen. Secondary Dressing: ABD Pad, 8x10 (Generic) 1 x Per Day/30 Days Discharge Instructions: Apply over primary dressing as directed. Secured With: [employer], 4.5x3.1 (in/yd) (Generic) 1 x Per Day/30 Days Discharge Instructions: Secure with Kerlix as directed. Secured With: 12M Medipore H Soft Cloth Surgical T ape, 4 x 10 (in/yd) (Generic) 1 x Per Day/30 Days Discharge Instructions: Secure with tape as directed. Com pression Stockings: Circaid Juxta Lite Compression Wrap Compression Amount: 20-30 mmHg (left) Discharge Instructions: Apply Circaid Juxta Lite Compression Wrap daily as instructed. Apply first thing in the morning, remove at night before bed. WOUND #5: - Lower Leg Wound Laterality: Left, Anterior, Distal Cleanser: Wound Cleanser (Generic) 1 x Per Day/30 Days Discharge Instructions: Cleanse the wound with wound cleanser prior to applying a clean dressing using gauze sponges, not tissue or cotton  balls. Topical: blastx 1 x Per Day/30 Days Discharge Instructions: apply a layer to wound bed. Prim Dressing: Fibracol Plus Dressing, 4x4.38 in (collagen) 1 x Per Day/30 Days ary Discharge Instructions: Moisten collagen over the collagen. Secondary Dressing: ABD Pad, 8x10 (Generic) 1 x Per Day/30 Days Discharge Instructions: Apply over primary dressing as directed. Secured With: [employer], 4.5x3.1 (in/yd) (Generic) 1 x Per Day/30 Days Discharge Instructions: Secure with Kerlix as directed. Secured With: 12M Medipore H Soft Cloth Surgical T ape, 4 x 10 (in/yd) (Generic) 1 x Per Day/30 Days Discharge Instructions: Secure with tape as directed. 1. Blast X and collagen 2. Juxta light compression daily 3. Follow-up in 1 week Electronic Signature(s) Signed: 08/05/2022 11:32:06 AM By: Kalman Shan DO Entered By: Kalman Shan on 08/05/2022 11:31:40 -------------------------------------------------------------------------------- HxROS Details Patient Name: Date of Service: Hailey Courser, DO [name] 08/05/2022 10:30 A M Medical Record Number: [id number] Patient Account Number: [id number] Date of Birth/Sex: Treating RN: Dec 24, 1945 (77 y.o. F) Primary Care Provider: Lorene Dy Other Clinician: Referring Provider: Treating Provider/Extender: [name] in Treatment: [address], [place] ([id number]) [id number] Page 8 of 9 Information Obtained From Patient Eyes Medical History: Negative for: Cataracts; Optic Neuritis Ear/Nose/Mouth/Throat Medical History: Negative for: Chronic sinus problems/congestion Hematologic/Lymphatic Medical History: Negative for: Anemia; Hemophilia; Human Immunodeficiency Virus; Lymphedema; Sickle Cell Disease Respiratory Medical History: Positive for: Asthma; Sleep Apnea Negative for: Aspiration; Chronic Obstructive Pulmonary Disease (COPD); Pneumothorax; Tuberculosis Past Medical History  Notes: 4L [place] 02 pulmonary fibrosis Cardiovascular Medical History: Positive for: Hypertension Negative for: Angina; Arrhythmia; Congestive Heart Failure; Coronary Artery Disease; Deep Vein Thrombosis; Hypotension; Myocardial Infarction; Peripheral Arterial Disease; Peripheral Venous Disease; Phlebitis; Vasculitis Gastrointestinal Medical History: Positive for: Colitis Negative for: Cirrhosis ; Crohns; Hepatitis A; Hepatitis B; Hepatitis C Endocrine Medical History: Negative for: Type I Diabetes; Type II Diabetes Immunological Medical History: Negative for: Lupus Erythematosus; Raynauds; Scleroderma Integumentary (Skin) Medical History: Negative for: History of Burn Musculoskeletal Medical History: Positive for: Rheumatoid Arthritis; Osteoarthritis Negative for: Gout; Osteomyelitis Neurologic Medical History: Negative for: Dementia; Neuropathy; Quadriplegia; Paraplegia; Seizure Disorder Oncologic Medical History: Negative for: Received Chemotherapy; Received Radiation Psychiatric Medical History: Negative for: Anorexia/bulimia; Confinement Anxiety Immunizations Pneumococcal Vaccine: ARBUTUS, NELLIGAN ([id number]) [id number] Page 9 of 9 Received Pneumococcal Vaccination: Yes Received Pneumococcal Vaccination On or  After 60th Birthday: Yes Implantable Devices No devices added Hospitalization / Surgery History Type of Hospitalization/Surgery surgery toe right hip replacement 12/2018 heart cath 12/17/2021 Family and Social History Cancer: Yes - Maternal Grandparents; Diabetes: Yes - Siblings; Heart Disease: Yes - Maternal Grandparents,Paternal Grandparents,Mother,Father,Siblings; Hereditary Spherocytosis: No; Hypertension: Yes - Mother,Father,Siblings; Kidney Disease: No; Lung Disease: No; Seizures: No; Stroke: Yes - Paternal Grandparents; Thyroid Problems: No; Tuberculosis: No; Never smoker; Marital Status - Married; Alcohol Use: Moderate; Drug Use:  No History; Caffeine Use: Moderate; Financial Concerns: No; Food, Clothing or Shelter Needs: No; Support System Lacking: No; Transportation Concerns: No Electronic Signature(s) Signed: 08/05/2022 11:32:06 AM By: Kalman Shan DO Entered By: Kalman Shan on 08/05/2022 11:30:06 -------------------------------------------------------------------------------- SuperBill Details Patient Name: Date of Service: [name], DO [name] 08/05/2022 Medical Record Number: [id number] Patient Account Number: [id number] Date of Birth/Sex: Treating RN: [birth year] (77 y.o. F) Primary Care Provider: Lorene Dy Other Clinician: Referring Provider: Treating Provider/Extender: [name] in Treatment: 9 Diagnosis Coding ICD-10 Codes Code Description [phone] Non-pressure chronic ulcer of other part of left lower leg with other specified severity I87.312 Chronic venous hypertension (idiopathic) with ulcer of left lower extremity J84.9 Interstitial pulmonary disease, unspecified M06.9 Rheumatoid arthritis, unspecified Facility Procedures : CPT4 Code: [id number] Description: 99214 - WOUND CARE VISIT-LEV 4 EST PT Modifier: Quantity: 1 Physician Procedures : CPT4 Code Description Modifier [id number] - WC PHYS LEVEL 3 - EST PT ICD-10 Diagnosis Description L97.828 Non-pressure chronic ulcer of other part of left lower leg with other specified severity I87.312 Chronic venous hypertension (idiopathic) with  ulcer of left lower extremity J84.9 Interstitial pulmonary disease, unspecified M06.9 Rheumatoid arthritis, unspecified Quantity: 1 Electronic Signature(s) Signed: 08/05/2022 11:59:53 AM By: Kalman Shan DO Signed: 08/05/2022 5:13:02 PM By: Deon Pilling RN, BSN Previous Signature: 08/05/2022 11:32:06 AM Version By: Kalman Shan DO Entered By: Deon Pilling on 08/05/2022 11:33:48

## 2022-08-11 ENCOUNTER — Telehealth: Payer: Self-pay | Admitting: Internal Medicine

## 2022-08-11 ENCOUNTER — Encounter: Payer: Self-pay | Admitting: Internal Medicine

## 2022-08-11 ENCOUNTER — Ambulatory Visit (INDEPENDENT_AMBULATORY_CARE_PROVIDER_SITE_OTHER): Payer: Medicare Other | Admitting: Internal Medicine

## 2022-08-11 ENCOUNTER — Encounter (HOSPITAL_BASED_OUTPATIENT_CLINIC_OR_DEPARTMENT_OTHER): Payer: Medicare Other | Admitting: Internal Medicine

## 2022-08-11 VITALS — BP 122/64 | HR 78 | Temp 98.9°F | Ht 61.0 in | Wt 182.0 lb

## 2022-08-11 DIAGNOSIS — J849 Interstitial pulmonary disease, unspecified: Secondary | ICD-10-CM | POA: Diagnosis not present

## 2022-08-11 DIAGNOSIS — L97828 Non-pressure chronic ulcer of other part of left lower leg with other specified severity: Secondary | ICD-10-CM | POA: Diagnosis not present

## 2022-08-11 DIAGNOSIS — I2510 Atherosclerotic heart disease of native coronary artery without angina pectoris: Secondary | ICD-10-CM

## 2022-08-11 DIAGNOSIS — R0602 Shortness of breath: Secondary | ICD-10-CM

## 2022-08-11 DIAGNOSIS — Z79899 Other long term (current) drug therapy: Secondary | ICD-10-CM

## 2022-08-11 DIAGNOSIS — I272 Pulmonary hypertension, unspecified: Secondary | ICD-10-CM

## 2022-08-11 DIAGNOSIS — Z7185 Encounter for immunization safety counseling: Secondary | ICD-10-CM

## 2022-08-11 DIAGNOSIS — J9611 Chronic respiratory failure with hypoxia: Secondary | ICD-10-CM

## 2022-08-11 DIAGNOSIS — I87312 Chronic venous hypertension (idiopathic) with ulcer of left lower extremity: Secondary | ICD-10-CM | POA: Diagnosis not present

## 2022-08-11 LAB — CBC WITH DIFFERENTIAL/PLATELET
Basophils Absolute: 0 10*3/uL (ref 0.0–0.1)
Basophils Relative: 0.5 % (ref 0.0–3.0)
Eosinophils Absolute: 0.1 10*3/uL (ref 0.0–0.7)
Eosinophils Relative: 2.7 % (ref 0.0–5.0)
HCT: 34.2 % — ABNORMAL LOW (ref 36.0–46.0)
Hemoglobin: 10.9 g/dL — ABNORMAL LOW (ref 12.0–15.0)
Lymphocytes Relative: 10.9 % — ABNORMAL LOW (ref 12.0–46.0)
Lymphs Abs: 0.5 10*3/uL — ABNORMAL LOW (ref 0.7–4.0)
MCHC: 32 g/dL (ref 30.0–36.0)
MCV: 94.2 fl (ref 78.0–100.0)
Monocytes Absolute: 0.4 10*3/uL (ref 0.1–1.0)
Monocytes Relative: 9.3 % (ref 3.0–12.0)
Neutro Abs: 3.7 10*3/uL (ref 1.4–7.7)
Neutrophils Relative %: 76.6 % (ref 43.0–77.0)
Platelets: 261 10*3/uL (ref 150.0–400.0)
RBC: 3.63 Mil/uL — ABNORMAL LOW (ref 3.87–5.11)
RDW: 14.3 % (ref 11.5–15.5)
WBC: 4.8 10*3/uL (ref 4.0–10.5)

## 2022-08-11 LAB — BASIC METABOLIC PANEL
BUN: 13 mg/dL (ref 6–23)
CO2: 33 mEq/L — ABNORMAL HIGH (ref 19–32)
Calcium: 9.5 mg/dL (ref 8.4–10.5)
Chloride: 101 mEq/L (ref 96–112)
Creatinine, Ser: 0.67 mg/dL (ref 0.40–1.20)
GFR: 83.77 mL/min (ref >60.00)
Glucose, Bld: 104 mg/dL — ABNORMAL HIGH (ref 70–99)
Potassium: 3.5 mEq/L (ref 3.5–5.1)
Sodium: 140 mEq/L (ref 135–145)

## 2022-08-11 LAB — HEPATIC FUNCTION PANEL
ALT: 6 U/L (ref 0–35)
AST: 10 U/L (ref 0–37)
Albumin: 3.4 g/dL — ABNORMAL LOW (ref 3.5–5.2)
Alkaline Phosphatase: 72 U/L (ref 39–117)
Bilirubin, Direct: 0.1 mg/dL (ref 0.0–0.3)
Total Bilirubin: 0.3 mg/dL (ref 0.2–1.2)
Total Protein: 5.4 g/dL — ABNORMAL LOW (ref 6.0–8.3)

## 2022-08-11 LAB — BRAIN NATRIURETIC PEPTIDE: Pro B Natriuretic peptide (BNP): 54 pg/mL (ref 0.0–100.0)

## 2022-08-11 NOTE — Progress Notes (Signed)
RENESHIA, ZUCCARO ([id number]) [id number] Page 1 of 9 Visit Report for 08/11/2022 Arrival Information Details Patient Name: Date of Service: [place]. 08/11/2022 12:30 PM Medical Record Number: [id number] Patient Account Number: [id number] Date of Birth/Sex: Treating RN: January 30, 1946 (77 y.o. [name] Primary Care Terree Gaultney: Lorene Dy Other Clinician: Referring Myson Levi: Treating Clemence Stillings/Extender: [name] in Treatment: 9 Visit Information History Since Last Visit Added or deleted any medications: No Patient Arrived: Wheel Chair Any new allergies or adverse reactions: No Arrival Time: 12:58 Had a fall or experienced change in No Accompanied By: husband activities of daily living that may affect Transfer Assistance: None risk of falls: Patient Identification Verified: Yes Signs or symptoms of abuse/neglect since last visito No Secondary Verification Process Completed: Yes Hospitalized since last visit: No Patient Requires Transmission-Based Precautions: No Implantable device outside of the clinic excluding No Patient Has Alerts: Yes cellular tissue based products placed in the center Patient Alerts: ABI's: L: N/C 11/23 since last visit: Has Dressing in Place as Prescribed: Yes Has Compression in Place as Prescribed: Yes Pain Present Now: No Electronic Signature(s) Signed: 08/11/2022 5:00:09 PM By: Sharyn Creamer RN, BSN Entered By: Sharyn Creamer on 08/11/2022 13:02:08 -------------------------------------------------------------------------------- Encounter Discharge Information Details Patient Name: Date of Service: [name], DO [name] 08/11/2022 12:30 PM Medical Record Number: [id number] Patient Account Number: [id number] Date of Birth/Sex: Treating RN: Jan 14, 1946 (77 y.o. [name] Primary Care Amarisa Wilinski: Lorene Dy Other Clinician: Referring Zarinah Oviatt: Treating Kaedyn Polivka/Extender:  [name] in Treatment: 9 Encounter Discharge Information Items Post Procedure Vitals Discharge Condition: Stable Temperature (F): 99.5 Ambulatory Status: Ambulatory Pulse (bpm): 101 Discharge Destination: Home Respiratory Rate (breaths/min): 22 Transportation: Private Auto Blood Pressure (mmHg): 157/74 Accompanied By: husband Schedule Follow-up Appointment: Yes Clinical Summary of Care: Patient Declined Electronic Signature(s) Signed: 08/11/2022 5:00:09 PM By: Sharyn Creamer RN, BSN Entered By: Sharyn Creamer on 08/11/2022 13:49:23 [name] ([id number]) [id number] Page 2 of 9 -------------------------------------------------------------------------------- Lower Extremity Assessment Details Patient Name: Date of Service: [place]. 08/11/2022 12:30 PM Medical Record Number: [id number] Patient Account Number: [id number] Date of Birth/Sex: Treating RN: 1946-02-09 (77 y.o. [name] Primary Care Demonie Kassa: Lorene Dy Other Clinician: Referring Theodora Lalanne: Treating Liara Holm/Extender: [name] in Treatment: 9 Edema Assessment Assessed: [name]: No] [name]: No] Edema: [Left: Ye] [Right: s] Calf Left: Right: Point of Measurement: 29 cm From Medial Instep 30.5 cm Ankle Left: Right: Point of Measurement: 9 cm From Medial Instep 22.5 cm Electronic Signature(s) Signed: 08/11/2022 5:00:09 PM By: Sharyn Creamer RN, BSN Entered By: Sharyn Creamer on 08/11/2022 13:05:58 -------------------------------------------------------------------------------- Multi Wound Chart Details Patient Name: Date of Service: [name], DO [name] 08/11/2022 12:30 PM Medical Record Number: [id number] Patient Account Number: [id number] Date of Birth/Sex: Treating RN: 07-02-1946 (77 y.o. F) Primary Care Hailey Miles: Lorene Dy Other Clinician: Referring Cassady Stanczak: Treating Madisan Bice/Extender: [name] in Treatment: 9 Vital Signs Height(in): 61 Pulse(bpm): 120 Weight(lbs): 186 Blood Pressure(mmHg): 157/74 Body Mass Index(BMI): 35.1 Temperature(F): 99.5 Respiratory Rate(breaths/min): 22 [4:Photos:] [N/A:N/A] Left, Proximal, Anterior Lower Leg Left, Distal, Anterior Lower Leg N/A Wound Location: Gradually Appeared Gradually Appeared N/A Wounding Event: Cellulitis Cellulitis N/A Primary Etiology: Asthma, Sleep Apnea, Hypertension, Asthma, Sleep Apnea, Hypertension, N/A Comorbid History: Colitis, Rheumatoid Arthritis, Colitis, Rheumatoid Arthritis, Osteoarthritis Osteoarthritis HARLAN, VINAL ([id number]) [id number] Page 3 of 9 04/19/2022 04/19/2022 N/A Date Acquired: 9 9 N/A Weeks of  Treatment: Open Open N/A Wound Status: No No N/A Wound Recurrence: 1.4x0.7x0.2 4.9x3x0.2 N/A Measurements L x W x D (cm) 0.77 11.545 N/A A (cm) : rea 0.154 2.309 N/A Volume (cm) : 81.80% 25.80% N/A % Reduction in Area: 87.90% -48.50% N/A % Reduction in Volume: Full Thickness With Exposed Support Full Thickness With Exposed Support N/A Classification: Structures Structures Medium Medium N/A Exudate A mount: Serosanguineous Serosanguineous N/A Exudate Type: red, brown red, brown N/A Exudate Color: Distinct, outline attached Distinct, outline attached N/A Wound Margin: Large (67-100%) Large (67-100%) N/A Granulation A mount: Pink, Pale Pink, Pale N/A Granulation Quality: Small (1-33%) Small (1-33%) N/A Necrotic A mount: Fat Layer (Subcutaneous Tissue): Yes Fat Layer (Subcutaneous Tissue): Yes N/A Exposed Structures: Fascia: No Tendon: Yes Tendon: No Fascia: No Muscle: No Muscle: No Joint: No Joint: No Bone: No Bone: No Large (67-100%) Small (1-33%) N/A Epithelialization: N/A Debridement - Selective/Open Wound N/A Debridement: Pre-procedure Verification/Time Out N/A 13:21 N/A Taken: N/A Lidocaine 5% topical  ointment N/A Pain Control: N/A Slough N/A Tissue Debrided: N/A Non-Viable Tissue N/A Level: N/A 14.7 N/A Debridement A (sq cm): rea N/A Curette N/A Instrument: N/A Minimum N/A Bleeding: N/A Pressure N/A Hemostasis A chieved: N/A 0 N/A Procedural Pain: N/A 0 N/A Post Procedural Pain: N/A Procedure was tolerated well N/A Debridement Treatment Response: N/A 4.9x3x0.2 N/A Post Debridement Measurements L x W x D (cm) N/A 2.309 N/A Post Debridement Volume: (cm) Excoriation: No Excoriation: No N/A Periwound Skin Texture: Induration: No Induration: No Callus: No Callus: No Crepitus: No Crepitus: No Rash: No Rash: No Scarring: No Scarring: No Maceration: No Maceration: No N/A Periwound Skin Moisture: Dry/Scaly: No Dry/Scaly: No Atrophie Blanche: No Atrophie Blanche: No N/A Periwound Skin Color: Cyanosis: No Cyanosis: No Ecchymosis: No Ecchymosis: No Erythema: No Erythema: No Hemosiderin Staining: No Hemosiderin Staining: No Mottled: No Mottled: No Pallor: No Pallor: No Rubor: No Rubor: No No Abnormality No Abnormality N/A Temperature: Yes Yes N/A Tenderness on Palpation: N/A Debridement N/A Procedures Performed: Treatment Notes Electronic Signature(s) Signed: 08/11/2022 2:31:01 PM By: Kalman Shan DO Entered By: Kalman Shan on 08/11/2022 13:33:41 -------------------------------------------------------------------------------- Multi-Disciplinary Care Plan Details Patient Name: Date of Service: [name], DO [name] 08/11/2022 12:30 PM Medical Record Number: [id number] Patient Account Number: [id number] Date of Birth/Sex: Treating RN: 06-16-1946 (77 y.o. [name] Primary Care Shantika Bermea: Lorene Dy Other Clinician: Referring Esperanza Madrazo: Treating Lillyan Hitson/Extender: Rushie Chestnut [place], [place] ([id number]) [id number] Page 4 of 9 Weeks in Treatment: 9 Active Inactive Pain, Acute or  Chronic Nursing Diagnoses: Pain, acute or chronic: actual or potential Potential alteration in comfort, pain Goals: Patient will verbalize adequate pain control and receive pain control interventions during procedures as needed Date Initiated: 06/03/2022 Target Resolution Date: 09/15/2022 Goal Status: Active Patient/caregiver will verbalize comfort level met Date Initiated: 06/03/2022 Target Resolution Date: 09/15/2022 Goal Status: Active Interventions: Encourage patient to take pain medications as prescribed Provide education on pain management Treatment Activities: Administer pain control measures as ordered : 06/03/2022 Notes: Venous Leg Ulcer Nursing Diagnoses: Potential for venous Insuffiency (use before diagnosis confirmed) Goals: Patient will maintain optimal edema control Date Initiated: 06/03/2022 Target Resolution Date: 09/15/2022 Goal Status: Active Interventions: Assess peripheral edema status every visit. Compression as ordered Provide education on venous insufficiency Treatment Activities: Therapeutic compression applied : 06/03/2022 Notes: Electronic Signature(s) Signed: 08/11/2022 5:00:09 PM By: Sharyn Creamer RN, BSN Entered By: Sharyn Creamer on 08/11/2022 13:09:17 -------------------------------------------------------------------------------- Pain Assessment Details Patient Name: Date of Service: [place], DO  [name] 08/11/2022 12:30 PM Medical Record Number: [id number] Patient Account Number: [id number] Date of Birth/Sex: Treating RN: Jun 25, 1946 (77 y.o. [name] Primary Care Undray Allman: Lorene Dy Other Clinician: Referring Braxtin Bamba: Treating Marycarmen Hagey/Extender: [name] in Treatment: 9 Active Problems Location of Pain Severity and Description of Pain Patient Has Paino No Site Locations [place], [place] ([id number]) [id number] Page 5 of 9 Pain Management and Medication Current Pain  Management: Electronic Signature(s) Signed: 08/11/2022 5:00:09 PM By: Sharyn Creamer RN, BSN Entered By: Sharyn Creamer on 08/11/2022 13:02:54 -------------------------------------------------------------------------------- Patient/Caregiver Education Details Patient Name: Date of Service: [place] Jetta Lout 1/25/2024andnbsp12:30 PM Medical Record Number: [id number] Patient Account Number: [id number] Date of Birth/Gender: Treating RN: [birth year] (77 y.o. [name] Primary Care Physician: Lorene Dy Other Clinician: Referring Physician: Treating Physician/Extender: [name] in Treatment: 9 Education Assessment Education Provided To: Patient Education Topics Provided Wound/Skin Impairment: Methods: Explain/Verbal Responses: State content correctly [employer]) Signed: 08/11/2022 5:00:09 PM By: Sharyn Creamer RN, BSN Entered By: Sharyn Creamer on 08/11/2022 13:09:41 -------------------------------------------------------------------------------- Wound Assessment Details Patient Name: Date of Service: [name], DO [name] 08/11/2022 12:30 PM Medical Record Number: [id number] Patient Account Number: [id number] Date of Birth/Sex: Treating RN: April 09, 1946 (77 y.o. [name] Primary Care Peng Thorstenson: Lorene Dy Other Clinician: Referring Ikhlas Albo: Treating Nadra Hritz/Extender: Rushie Chestnut [place], [place] ([id number]) [id number] Page 6 of 9 Weeks in Treatment: 9 Wound Status Wound Number: 4 Primary Cellulitis Etiology: Wound Location: Left, Proximal, Anterior Lower Leg Wound Open Wounding Event: Gradually Appeared Status: Date Acquired: 04/19/2022 Comorbid Asthma, Sleep Apnea, Hypertension, Colitis, Rheumatoid Weeks Of Treatment: 9 History: Arthritis, Osteoarthritis Clustered Wound: No Photos Wound Measurements Length: (cm) 1.4 Width: (cm) 0.7 Depth: (cm) 0.2 Area:  (cm) 0.77 Volume: (cm) 0.154 % Reduction in Area: 81.8% % Reduction in Volume: 87.9% Epithelialization: Large (67-100%) Tunneling: No Undermining: No Wound Description Classification: Full Thickness With Exposed Support Structures Wound Margin: Distinct, outline attached Exudate Amount: Medium Exudate Type: Serosanguineous Exudate Color: red, brown Foul Odor After Cleansing: No Slough/Fibrino Yes Wound Bed Granulation Amount: Large (67-100%) Exposed Structure Granulation Quality: Pink, Pale Fascia Exposed: No Necrotic Amount: Small (1-33%) Fat Layer (Subcutaneous Tissue) Exposed: Yes Necrotic Quality: Adherent Slough Tendon Exposed: No Muscle Exposed: No Joint Exposed: No Bone Exposed: No Periwound Skin Texture Texture Color No Abnormalities Noted: No No Abnormalities Noted: No Callus: No Atrophie Blanche: No Crepitus: No Cyanosis: No Excoriation: No Ecchymosis: No Induration: No Erythema: No Rash: No Hemosiderin Staining: No Scarring: No Mottled: No Pallor: No Moisture Rubor: No No Abnormalities Noted: No Dry / Scaly: No Temperature / Pain Maceration: No Temperature: No Abnormality Tenderness on Palpation: Yes Treatment Notes Wound #4 (Lower Leg) Wound Laterality: Left, Anterior, Proximal Cleanser Wound Cleanser Discharge Instruction: Cleanse the wound with wound cleanser prior to applying a clean dressing using gauze sponges, not tissue or cotton balls. Peri-Wound Care Topical KIMBERLIN, SCHEEL ([id number]) [id number] Page 7 of 9 blastx Discharge Instruction: apply a layer to wound bed. Primary Dressing Fibracol Plus Dressing, 4x4.38 in (collagen) Discharge Instruction: Moisten collagen over the collagen. Secondary Dressing ABD Pad, 8x10 Discharge Instruction: Apply over primary dressing as directed. Secured With [employer], 4.5x3.1 (in/yd) Discharge Instruction: Secure with Kerlix as directed. 73M Medipore H  Soft Cloth Surgical T ape, 4 x 10 (in/yd) Discharge Instruction: Secure with tape as directed. Compression Wrap Compression Stockings Circaid Juxta Lite Compression Wrap Quantity: 1 Left Leg Compression Amount:  20-30 mmHg Discharge Instruction: Apply Circaid Juxta Lite Compression Wrap daily as instructed. Apply first thing in the morning, remove at night before bed. Add-Ons Electronic Signature(s) Signed: 08/11/2022 5:00:09 PM By: Sharyn Creamer RN, BSN Entered By: Sharyn Creamer on 08/11/2022 13:15:50 -------------------------------------------------------------------------------- Wound Assessment Details Patient Name: Date of Service: [place]. 08/11/2022 12:30 PM Medical Record Number: [id number] Patient Account Number: [id number] Date of Birth/Sex: Treating RN: April 15, 1946 (77 y.o. [name] Primary Care Nicholette Dolson: Lorene Dy Other Clinician: Referring Keaton Beichner: Treating Hilja Kintzel/Extender: [name] in Treatment: 9 Wound Status Wound Number: 5 Primary Cellulitis Etiology: Wound Location: Left, Distal, Anterior Lower Leg Wound Open Wounding Event: Gradually Appeared Status: Date Acquired: 04/19/2022 Comorbid Asthma, Sleep Apnea, Hypertension, Colitis, Rheumatoid Weeks Of Treatment: 9 History: Arthritis, Osteoarthritis Clustered Wound: No Photos Wound Measurements Length: (cm) 4.9 Width: (cm) 3 Depth: (cm) 0.2 [place], Majesta S ([id number]) Area: (cm) 11.545 Volume: (cm) 2.309 % Reduction in Area: 25.8% % Reduction in Volume: -48.5% Epithelialization: Small (1-33%) [phone].pdf Page 8 of 9 Tunneling: No Undermining: No Wound Description Classification: Full Thickness With Exposed Support Structures Wound Margin: Distinct, outline attached Exudate Amount: Medium Exudate Type: Serosanguineous Exudate Color: red, brown Foul Odor After Cleansing: No Slough/Fibrino Yes Wound Bed Granulation  Amount: Large (67-100%) Exposed Structure Granulation Quality: Pink, Pale Fascia Exposed: No Necrotic Amount: Small (1-33%) Fat Layer (Subcutaneous Tissue) Exposed: Yes Necrotic Quality: Adherent Slough Tendon Exposed: Yes Muscle Exposed: No Joint Exposed: No Bone Exposed: No Periwound Skin Texture Texture Color No Abnormalities Noted: No No Abnormalities Noted: No Callus: No Atrophie Blanche: No Crepitus: No Cyanosis: No Excoriation: No Ecchymosis: No Induration: No Erythema: No Rash: No Hemosiderin Staining: No Scarring: No Mottled: No Pallor: No Moisture Rubor: No No Abnormalities Noted: No Dry / Scaly: No Temperature / Pain Maceration: No Temperature: No Abnormality Tenderness on Palpation: Yes Treatment Notes Wound #5 (Lower Leg) Wound Laterality: Left, Anterior, Distal Cleanser Wound Cleanser Discharge Instruction: Cleanse the wound with wound cleanser prior to applying a clean dressing using gauze sponges, not tissue or cotton balls. Peri-Wound Care Topical blastx Discharge Instruction: apply a layer to wound bed. Primary Dressing Fibracol Plus Dressing, 4x4.38 in (collagen) Discharge Instruction: Moisten collagen over the collagen. Secondary Dressing ABD Pad, 8x10 Discharge Instruction: Apply over primary dressing as directed. Secured With [employer], 4.5x3.1 (in/yd) Discharge Instruction: Secure with Kerlix as directed. 46M Medipore H Soft Cloth Surgical T ape, 4 x 10 (in/yd) Discharge Instruction: Secure with tape as directed. Compression Wrap Compression Stockings Add-Ons Electronic Signature(s) Signed: 08/11/2022 5:00:09 PM By: Sharyn Creamer RN, BSN Entered By: Sharyn Creamer on 08/11/2022 [place], Arvil Persons ([id number]) [id number] Page 9 of 9 -------------------------------------------------------------------------------- Vitals Details Patient Name: Date of Service: [name] [name] 08/11/2022  12:30 PM Medical Record Number: [id number] Patient Account Number: [id number] Date of Birth/Sex: Treating RN: 1946/03/08 (77 y.o. [name] Primary Care Macedonio Scallon: Lorene Dy Other Clinician: Referring Jatasia Gundrum: Treating Tionne Carelli/Extender: [name] in Treatment: 9 Vital Signs Time Taken: 13:00 Temperature (F): 99.5 Height (in): 61 Pulse (bpm): 120 Weight (lbs): 186 Respiratory Rate (breaths/min): 22 Body Mass Index (BMI): 35.1 Blood Pressure (mmHg): 157/74 Reference Range: 80 - 120 mg / dl Electronic Signature(s) Signed: 08/11/2022 5:00:09 PM By: Sharyn Creamer RN, BSN Entered By: Sharyn Creamer on 08/11/2022 13:01:16

## 2022-08-11 NOTE — Progress Notes (Signed)
05/21/2018- 77 year old female never smoker followed for allergic rhinitis, rhinosinusitis, asthmatic bronchitis, ILD, OSA,complicated by GERD/Crohn's, Rheumatoid arthritis/prednisone -----4 month follow up for asthma and OSA. Per patient she is still having issues with SOB. She has had 2 foot surgeries since her last visit. States that the [name] is working but [name] wanted to switch her to something else. Unsure of the medication name and I did not see it in his last OV.  Singulair, and Anoro, neb duoneb CPAP auto 5-15/Advanced   Download 100% compliance AHI 1.3/hour. I discussed FENO and she will try Symbicort. Discussed inhaled steroids.  We need to get her a new AeroChamber. Has been on sustained antibiotics for failed appliance in her foot. Notices some runny nose and scant bland mucus.  Wheezing has been well controlled.  Little cough.  She and her husband recognize inability to get any exercise as a substantial reason for dyspnea.  No acute events.  Cardiology continues to follow, including her pulmonary hypertension.  We reviewed her most recent chest CT and discussed implications of progressive interstitial fibrosis.  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  I would like to refer her to Dr. Chase [name] for his opinion and advice. FENO 03/15/18  49 H CT chest Hi Res 02/02/2018- IMPRESSION: 1. Spectrum of findings compatible with fibrotic interstitial lung disease with a slight basilar predominance, with mild progression since 2017. 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 2. Mild patchy air trapping, unchanged, indicative of small airways disease. 3. Left main and two-vessel coronary atherosclerosis. 4. Stable dilated main pulmonary artery, suggesting chronic pulmonary arterial hypertension. Aortic Atherosclerosis (ICD10-I70.0).  06/08/18-  77 year old female never smoker followed for allergic rhinitis, rhinosinusitis, asthmatic bronchitis, ILD, OSA,complicated by GERD/Crohn's, Rheumatoid arthritis/prednisone -----coughing for 2 weeks, yellow, green mucus, SOB  Singulair, Atrovent nasal spray, Symbicort 160, and Anoro, neb albuterol, neb DuoNeb, Complains of increased cough with chest congestion, green/yellow sputum, low-grade fever 99 degrees over the last 2 weeks.  Not on prednisone now.  Last antibiotic in October. Pending ILD consultation with Dr. Chase [name] in December.   07/02/2018  - Visit   77 year old female patient presenting today for acute visit.  Patient was recently treated with a course of Augmentin and reports sputum color is now light green but has decreased in sputum amount since last office visit.  Patient does not feel she is getting better.  Patient remains adherent to Anoro Ellipta.  On arrival to our office today patient was short of breath and oxygen saturations were 81%.  With deep breathing patient was able to increase oxygenation to 85%.  When placed on 2 L O2 patient was able to maintain at 91%.  Patient had previously been on oxygen in 2015 but this was later [facility].  Patient's weight has also increased over the previous office visits, patient remains adherent to diuretic treatment plan as on taking 1 diuretic daily.  Patient reports she is allowed to take an additional diuretic when she feels necessary.  Unfortunately patient has not been weighing herself regularly there is no set plan to when she should take her second diuretic.  Patient is exceptionally sedentary at home and rarely gets out of her bed or lazy boy chair which is about 3 feet from her bed.  Patient's husband takes care of patient completely including getting drinks and food so patient does not have to leave her lazy boy  recliner.  Patient watches many television shows and does not report much activity.   Patient has completed follow-up with  rheumatology last week and no changes to her medications were made.  Patient reports that she was stable at the time.  CPAP compliance report showing 30 out of 30 days used.  All 30 those days greater than 4 hours.  APAP settings 5-15.  Average usage days 7 hours and 56 minutes.  AHI 0.8.    OV 07/31/2018 -referred to interstitial lung disease clinic  Subjective:  [name], female , DOB: 1945-11-17 , age 70 y.o. , MRN: [id number] , ADDRESS: [address] [place] [address]   07/31/2018 -   Chief Complaint  Patient presents with   Follow-up    Pt of Dr. Annamaria [name] that stated to be referred to ILD clinic.  Pt currently has complaints of wheezing which she has had since December 2019, worsening SOB when moving around and also states she has a pain in left side of chest near shoulder. Pt wears between 2-4L O2 but mainly has been wearing 4L at home.      HPI [name] 77 y.o. -accompanied by her husband.  Referred by [name] pulmonologist in our practice for evaluation of interstitial lung disease in the setting of rheumatoid arthritis.  History is gained from her, talking to her husband, review of recent office visits and also the interstitial lung disease questionnaire.  As best as I can gather   [name] ILD Questionnaire  Symptoms: She is known to have ILD on a CT chest in 2017 but she is not aware of it.  She says that she follows normally for sleep apnea and asthma with [name].  She recollects that approximately 11 or 12 years ago she was hospitalized for pneumonia and was on oxygen for short while not otherwise specified.  Then came off oxygen.  After that overall she is been stable and sedentary using her CPAP.  She is mostly sedentary because of obesity, rheumatoid arthritis and also because of the nonhealing ulcer in her feet for which she is been advised sedentary lifestyle to enable healing.  Most recently to  Thanksgiving 2019 she was seen for respiratory exacerbation and given antibiotics and prednisone.  She followed up mid December 2019 and was found to have new onset hypoxemia 85% on room air.  She was then discharged on portable oxygen which she is using 4 L nasal cannula at rest.  She tells me that at home when she comes off of CPAP she is in her 67s on room air at rest.  In fact in the office today she is 83% on room air at rest but 93% on 2 L nasal cannula at rest.  There is concerned that she has worsening ILD particularly summer 2019 on her CT chest had progressive findings compared to 2 years earlier.  The presumption is that this ILD is caused by rheumatoid arthritis.  In the 2019 CT chest CT is reported as probable UIP but in my personal visualization is either indeterminate or alternative diagnosis.  In terms of symptoms: She reports insidious onset of shortness of breath for some years and gradually getting worse.  Level 2 dyspnea at rest.  Level for dyspnea taking a shower.  Level 5 dyspnea walking at her own pace of walking with others of her age and walking up stairs or walking up a hill.  She does have  a cough with yellow and green sputum.  She does cough at night.  There is also wheezing  Past Medical History : She has longstanding history of rheumatoid arthritis since the 1980s.  Used to be followed by Dr. Danie Binder.  As best as I can gather she used to be on methotrexate maybe 10 or 20 years ago.  She took it for a short while and then stopped it because of diarrhea.  She took this for less than 1 year.  She believes that after that she was basically on pain management.  She is only been on prednisone chronic intake for a total of 1 year approximately 4 to 5 years ago.  Around this time Dr. Hoyt Koch retired and she started seeing Dr. Gavin Pound.  Since then she is been on Arava and Plaquenil.  She has never seen any other immunomodulators in the setting of rheumatoid arthritis  according to history.  Overall the rheumatoid arthritis has caused some disability with deformed joints in her hands and also her using cane.    She is not on any anticoagulation is never had any blood clots.  In 2017 she had vascular extremity duplex venous that was negative for DVT.  Most recently she is been dealing with nonhealing ulcer of the left toe/metatarsal .  She had August and September 2018.  And then 1 in the latter part of 2019.  Most recently she says she has had a duplex lower extremity venous.  I Hailey not have access to these results.  She is waiting on this.  Was done in the last few to several days.   She also has a longstanding history of asthma for which she is on Singulair.  Exam nitric oxide was elevated in August 2019 at 49 ppb but 07/31/2018 I snormal in the 20s though she is wheezing   She has nonobstructive coronary artery disease and in March 2018 had a right heart catheterization with elevated pulmonary pressures but also slightly elevated wedge pressure [see below].  She is started on Lasix  She has sleep apnea for which she uses CPAP.  She has obesity  ROS: Positive for arthralgia and arthritis.  Dry eyes and dry mouth but otherwise negative   FAMILY HISTORY of LUNG DISEASE: Denies   EXPOSURE HISTORY: Never smoked any cigarettes, marijuana, vaping, cocaine or intravenous drug use   HOME and HOBBY DETAILS : [place] lived [place] for 8 years.  No mold.  No dampness.  Does use humidifier and does use CPAP but there is no mold in it.  Also uses nebulizer machine but there is no mold.  No pet birds no musical instruments no guarding habits   OCCUPATIONAL HISTORY (122 questions) : Denies   PULMONARY TOXICITY HISTORY (27 items): She has taken nitrofurantoin 9 years ago.  Has not taken methotrexate for a year 5 years ago and is taken sulfasalazine       Results for SOPHYA, VANBLARCOM (MRN [id number]) as of 07/31/2018 16:31  Ref. Range  09/03/2015 12:45 02/21/2017  FVC-Pre Latest Units: L 2.18 1.7L  FVC-%Pred-Pre Latest Units: % 84 66%   Results for JANIRA, MANDELL (MRN [id number]) as of 07/31/2018 16:31  Ref. Range 09/03/2015 12:45  DLCO cor Latest Units: ml/min/mmHg 14.06  DLCO cor % pred Latest Units: % 69   HEART CATH MARCH 2018 Right Heart Pressures RA (mean): 13 mmHg RV (S/EDP): 38/12 mmHg PA (S/D, mean): 43/23 (32) mmHg PCWP (mean): 16 mmHg  Conclusions: Mild to moderate, non-obstructive coronary artery disease with 50% mid LAD and 20% proximal RCA stenoses. Mild to moderate pulmonary hypertension with mildly elevated right heart filling pressures. Upper normal left heart filling pressures. Normal Fick cardiac output/index. Equalization of end-diastolic pressures with ventricular concordance. This can be seen in the setting of restrictive process.   Recommendations: Escalate statin therapy to prevent progression of CAD. Increase furosemide to 40 mg BID and KCl to 40 mg BID. Patient to have BMP in 1 week to evaluate renal function and potassium. Continue outpatient pulmonary follow-up. I suspect underlying lung disease is the driving force behind the patient's dyspnea, pulmonary hypertension, and elevated right heart pressures. Outpatient follow-up in cardiology clinic in ~6 weeks.   Nelva Bush, MD   CT chest high res July 2019 - visualized personally - personaly opinion - indeterminate or alt dx for UIP but agree with progression  IMPRESSION: Lungs/Pleura: No pneumothorax. No pleural effusion. No acute consolidative airspace disease or lung masses. Few scattered small solid pulmonary nodules in mid to upper right lung, largest 4 mm in the right upper lobe (series 3/image 46), unchanged since 03/14/2016 CT, considered benign. No new significant pulmonary nodules. Mild patchy air trapping in both lungs on the expiration sequence, unchanged. Patchy peribronchovascular and subpleural reticulation and  ground-glass attenuation throughout both lungs with associated mild traction bronchiectasis and mild architectural distortion. There is a slight basilar gradient to these findings. Tiny focus of honeycombing in anterior right upper lobe (series 8/image 59). These findings have mildly worsened since 03/14/2016 chest CT. 1. Spectrum of findings compatible with fibrotic interstitial lung disease with a slight basilar predominance, with mild progression since 2017. 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 2. Mild patchy air trapping, unchanged, indicative of small airways disease. 3. Left main and two-vessel coronary atherosclerosis. 4. Stable dilated main pulmonary artery, suggesting chronic pulmonary arterial hypertension.   Aortic Atherosclerosis (ICD10-I70.0).     Electronically Signed   By: Ilona Sorrel M.D.   On: 02/02/2018 14:03        OV 03/15/2019  Subjective:  [name], female , DOB: May 13, 1946 , age 50 y.o. , MRN: [id number] , AD[address]ESS: [address] [place] [address]   03/15/2019 -   Chief Complaint  Patient presents with   IPD (Interstitial pulmonary disease)    Breathing is doing better since being on Esbriet. Has productive cough with green phelgm. Has been on antibiotic for last 6 weeks. Had hip replacement in June.     HPI IYSHA MISHKIN 77 y.o. 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  Started on Esbriet in March 2020.  Has progressive phenotype.   She presents for routine follow-up with her husband.  She tells me that ever since starting Esbriet in the spring 2020 she has been doing well.  She does not feel that the disease has progressed.  In fact she feels somewhat better.  She continues to use 4 L of oxygen.  She is now taking the Esbriet at 1 pill 3 times daily.  This is the  full dose of 1 pill.  Her last pulmonary function test was in early 2020.  It has been a while since she did her liver function test and she requires a repeat of that today.  She is willing to have her flu shot today.  She is asking for prescription for  emergency electric generator.  She does not know about the [employer] energy assistance program.  She has a handicap sticker.  She is interested in the ILD-PRO research registry study.    She is on Trelegy inhaler and Singulair I am not fully clear why   In June 2020 she had hip replacement for stress fracture and was on 6 weeks of Keflex.  She is slowly improving.  She uses a walker.  OV 05/17/2019  Subjective:  [name], female , DOB: 04-28-1946 , age 72 y.o. , MRN: [id number] , ADDRESS: [address] [place] [address]   05/17/2019 -   Chief Complaint  Patient presents with   Follow-up    Patient reports that she has had a productive cough with yellow sputum and her PCP placed her on an antibiotic.    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  Started on Esbriet in March 2020.  Has progressive phenotype. COVID negative 04/25/2019,. On 02 4L [place] since end 2019  Also hx of asthma  Also hx of chronic sinusitis - 2017 CT maxillary sinusits - DR Wilburn Cornelia  History of sleep apnea on CPAP  HPI [name] 77 y.o. -presents to the ILD clinic for the above issues.  She continues on pirfenidone since March 2020.  She says this is helping her.  No adverse effects.  Last liver function test was August 2020.  She needs another liver function test right now.  No nausea vomiting or diarrhea or weight loss.  She continues on 4 L of oxygen continuously 24/7.  At night she uses CPAP for her sleep apnea.  She and her husband think that she needs 6 L of oxygen at night and they are trying to increase the oxygen.  Although this is not based on data but on subjective reasoning.     New issue associated with sinus issues: Her main issue today is that for the last 3-week she has had worsening of her cough and there is associated yellow sputum.  Her COVID-19 test was -2 days ago.  This been going on for 3 weeks.  She tells me this happens to her every winter.  She is complaining of chronic postnasal drainage.  In 2017 she had a CT scan of the sinus that showed maxillary sinusitis and saw Dr. Wilburn Cornelia but since then has not followed up.  She does not have a history of sinus surgery.  She is frustrated by the cough and the yellow sputum.  Her pulmonary function test itself is stable since she started the [place].  She is interested in the ILD pro registry study.  In terms of her asthma: She is on Trelegy and Singulair.  She does not want to stop this.  There is a clinical history.  She says that if she stops this it gets worse.   In terms of her sleep apnea: She continues on CPAP with oxygen at night.    ROS    IMPRESSION: CT chest Jan  2020 1. Severe tracheobronchomalacia, more pronounced on today's scan. 2. Interval progression of basilar predominant fibrotic interstitial lung disease with particularly increased ground-glass component. No honeycombing. 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 Findings are consistent with UIP per consensus guidelines: Diagnosis of Idiopathic Pulmonary Fibrosis: An Official ATS/ERS/JRS/ALAT Clinical Practice Guideline. [place], Iss 5, [phone], Mar 18 2017. 3.  Stable dilated main pulmonary artery, suggesting chronic pulmonary arterial hypertension. 4. Left main and 3 vessel coronary atherosclerosis.   Aortic Atherosclerosis (ICD10-I70.0).     Electronically Signed   By: Ilona Sorrel M.D.   On: 08/20/2018 08:21   - per HPI  OV 04/02/2020  Subjective:  [name], female , DOB: 04/07/1946 , age 51 y.o. 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  Started on Esbriet in March 2020.  Has progressive phenotype. COVID negative 04/25/2019,. On 02 4L [place] since end 2019  - on ild pro registry  Also hx of asthma -Singulair and inhaler  Also hx of chronic sinusitis - 2017 CT maxillary sinusits - DR Wilburn Cornelia  History of sleep apnea on CPAP  Rheumatoid arthritis seen by Dr. Gavin Pound on leflunomide, Plaquenil and daily prednisone 5 mg/day.  04/02/2020 -   Chief Complaint  Patient presents with   Follow-up    pt is here to go over pft results     HPI [name] 77 y.o. -presents for the above issues of interstitial lung disease in the setting of connective tissue disease.  She is on leflunomide and Plaquenil and prednisone with her rheumatologist.  She is on pirfenidone for her ILD.  I personally last saw her almost a year ago in October 2020.  After that she is seen nurse practitioner.  In the interim she says she is overall stable with the symptom score show a different story she has had significant worsening of her symptoms.  She continues to be morbidly obese.  Walking desaturation test she is desaturating to the point she is requiring 6 L to correct.  In fact early in August 2020 [place] and was extremely short of breath.  Even at rest on 3 L she was 89%.  She continues to use a CPAP.  Her husband who is here with her today would like her to lose weight in order to help her health status.  Pulmonary function test shows also a decline.  Her last CT scan of the chest was in June 2020    [place] 05/13/2020   Subjective:  [name], female , DOB: 1946-07-11, age 27 y.o. years. 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    - Started on Esbriet in March 2020.  Has progressive phenotype. COVID negative 04/25/2019,. On 02 4L [place] since end 2019  - on ild pro registry  Also hx of asthma -Singulair and inhaler  Also hx of chronic sinusitis - 2017 CT maxillary sinusits - DR Wilburn Cornelia  History of sleep apnea on CPAP  Rheumatoid arthritis seen by Dr. Gavin Pound on leflunomide, Plaquenil and daily prednisone 5 mg/day.  Elevated pulmonary artery pressures but PVR less than 3 in 2018: Right Heart Pressures RA (mean): 13 mmHg RV (S/EDP): 38/12 mmHg PA (S/D, mean): 43/23 (32) mmHg PCWP (mean): 16 mmHg  Ao sat: 94% PA sat: 70% RA sat: 75%  Fick CO: 6.4 L/min Fick  CI: 3.7 L/min/m^2  PVR: 2.5 [place] units     Chief Complaint  Patient presents with   Follow-up    more sob walking shorter distances.  coughing up yellow phlegm.       HPI [name] 77 y.o. -last visit September 2021.  At that time concern for progressive ILD.  Therefore we decided to PFTs and also high-resolution CT chest.  The high-resolution CT chest compared to last year shows stability.  The PFTs show stability within this year but progression compared to last year.  She is also lost weight following a low carbohydrate diet.  Nevertheless she feels the weight of her symptoms.  She is also using oxygen with exertion.  She feels this is getting worse.  Although her overall symptom burden seems to be improved with the weight loss.  Early morning she says there are some yellow phlegm which is stable for many many years.  This is mostly sinus.  There is no change in this.  She continues on immunosuppressants and antifibrotic.  There is no  side effects from these.  She is willing to have labs checked.  She has had a Covid vaccine including booster.  She does not know if she has antibody response yet.  She is willing to get this checked.  She is looking at options to get better.  She will participate in the ILD-pro registry visit today.  Review of the records indicate the last right eye catheterization was in 2018.  Pressures were borderline at that time.  She is willing to get this checked.  Her cardiologist is in [place].  She wants to get the procedure done in [place] and is willing to see Dr. Haroldine Laws or Dr. Aundra Dubin who are well versed with right heart catheterization locally.    Wt Readings from Last 3 Encounters:  05/13/20 202 lb 12.8 oz (92 kg)  04/02/20 207 lb 9.6 oz (94.2 kg)  02/05/20 218 lb (98.9 kg)       TECHNIQUE: CT chest sept 2021 Multidetector CT imaging of the chest was performed following the standard protocol without intravenous contrast. High resolution imaging of the lungs, as well as inspiratory and expiratory imaging, was performed.   COMPARISON:  08/16/2018, 02/02/2018, 03/14/2016, 08/27/2015   FINDINGS: Cardiovascular: Aortic atherosclerosis. Normal heart size. Three-vessel coronary artery calcifications. No pericardial effusion.   Mediastinum/Nodes: No enlarged mediastinal, hilar, or axillary lymph nodes. Thyroid gland, trachea, and esophagus demonstrate no significant findings.   Lungs/Pleura: Unchanged moderate pulmonary fibrosis in a pattern with apical to basal gradient featuring irregular peripheral interstitial opacity, septal thickening, ground-glass, and minimal tubular bronchiectasis without evidence of bronchiolectasis or honeycombing. Fibrotic findings are not significantly changed compared to immediate prior examination but significantly worsened over time on examinations dating back to 08/27/2015 occasional stable, definitively benign small pulmonary nodules, for  example a 3 mm nodule of the anterior right upper lobe (series 10, image 77). No significant air trapping on expiratory phase imaging. No pleural effusion or pneumothorax.   Upper Abdomen: No acute abnormality.   Musculoskeletal: No chest wall mass or suspicious bone lesions identified.   IMPRESSION: 1. Unchanged moderate pulmonary fibrosis in a pattern with apical to basal gradient featuring irregular peripheral interstitial opacity, septal thickening, ground-glass, and minimal tubular bronchiectasis without evidence of bronchiolectasis or honeycombing. Fibrotic findings are not significantly changed compared to immediate prior examination but significantly worsened over time on examinations dating back to occasional 08/27/2015. Findings remain consistent with a "probable UIP" pattern by  pulmonary fibrosis criteria and generally in keeping with reported history of rheumatoid arthritis and associated fibrotic interstitial lung disease. 2. Coronary artery disease.  Aortic Atherosclerosis (ICD10-I70.0).     Electronically Signed   By: Eddie Candle M.D.   On: 04/15/2020 15:35     OV 08/19/2020  Subjective:  [name], female , DOB: 1945-07-26 , age 41 y.o. 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    - Started on Esbriet in March 2020.  Has progressive phenotype. COVID negative 04/25/2019,. On 02 4L [place] since end 2019  - on ild pro  registry  Also hx of asthma -Singulair and inhaler  Also hx of chronic sinusitis - 2017 CT maxillary sinusits - DR Wilburn Cornelia  History of sleep apnea on CPAP  Rheumatoid arthritis seen by Dr. Gavin Pound on leflunomide, Plaquenil and daily prednisone 5 mg/day.    HPI [name] 77 y.o. -presents with her husband.  Last seen in October 2021.  After that in January 2022 she was supposed to have a right heart catheterization because of concern of progressive ILD and her having autoimmune phenotype.  However middle of January 2022 she got sick with bronchitis.  She and her husband say that she gets this sick every year.  They Hailey not know if it was Covid because she did not get tested.  She says primary care physician treated her with 10 days of 20 mg/day prednisone [at baseline she is on 5 mg/day of].  And also doxycycline for 10 days.  This is not helped.  Therefore she is now on amoxicillin and a second course of prednisone 20 mg/day for 10 days.  Her baseline dose is 5 mg prednisone.  She is not feeling better.  In fact her symptom scores are much worse.  Her pulse ox on room air at rest was 88%.  Previously room air at rest was fine.  She is frustrated by her symptoms.  She and her husband are frustrated by the symptom burden.  She is requesting Phenergan with codeine for cough control.     OV 09/03/2020  Subjective:  [name], female , DOB: Nov 23, 1945 , age 56 y.o. , MRN: [id number] , ADDRESS: [address] [place] [address] PCP Lorene Dy, MD Patient Care Team: Lorene Dy, MD as PCP - General (Internal Medicine) End, Harrell Gave, MD as PCP - Cardiology (Cardiology) Gavin Pound, MD as Consulting Physician (Rheumatology)  This Provider for this visit: Treatment Team:  Attending Provider: Brand Males, MD  Type of visit: Telephone/Video Circumstance: COVID-19 national emergency Identification of patient JOIE HIPPS with Jan 05, 1946  and MRN [id number] - 2 person identifier Risks: Risks, benefits, limitations of telephone visit explained. Patient understood and verbalized agreement to proceed Anyone else on call: just patient Patient location: [phone] This provider location: [place]   09/03/2020 -  No chief complaint on file.    HPI [name] 77 y.o. -  this telephone visit this telephone visit is to follow-up from recent exacerbation early February 2022.  She is finished her prednisone she is feeling back to baseline.  She says her sinuses are congested but she will follow-up with the ENT.  She is still waiting for a right heart catheterization.  She has follow-up later in March 2022 with me.  Oxygen use is baseline.  She had Covid IgG checked and this was normal.  She continues to use a CPAP.  She is regular with all her medications.  She took consent form for PULSE inhaled nitric oxide study and she is interested.  Recent lab review shows slightly high calcium.  She does not know that she has this problem    PFT   OV 10/15/2020  Subjective:  [name], female , DOB: 01-04-1946 , age 61 y.o. , MRN: [id number] , ADDRESS: [address] [place] [address] PCP Lorene Dy, MD Patient Care Team: Lorene Dy, MD as PCP - General (Internal Medicine) End, Harrell Gave, MD as PCP - Cardiology (Cardiology) Gavin Pound, MD as Consulting Physician (Rheumatology)  This Provider for this visit: Treatment Team:  Attending Provider: Brand Males, MD    10/15/2020 -   Chief Complaint  Patient presents with   Follow-up    She felt she was breathing deeper before pft.      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      - Started on Esbriet in March 2020.  Has progressive phenotype. COVID negative 04/25/2019,. On 02 4L [place] since end 2019   - on ild pro registry  - last CT Sept 2021  - started PULSE iNO study march  2022 via study protocol -randomized trial  Also hx of asthma -Singulair and inhaler trelegy  Also hx of chronic sinusitis - 2017 CT maxillary sinusits - DR Wilburn Cornelia  History of sleep apnea on CPAP  Rheumatoid arthritis seen by Dr. Gavin Pound on leflunomide, Plaquenil and daily prednisone 5 mg/day.  Positive Covid IgG - Feb 2022   HPI [name] 77 y.o. -presents for standard of care visit.  She is now on inhaled nitric oxide study.  The inhaled nitric oxide is being bled through her oxygen.  She has to wear 12 hours a day.  She was randomized 2 days ago.  She tells me for the last 4 days she is having sinus complaints of yellow sinus drainage and increased cough.  She feels as acute sinusitis and she will benefit from antibiotics and prednisone.  Specifically she did not mention this at the time of randomization.  She denied this at the time of randomization but now she says it was present for 4 days.  She had pulmonary function test that shows continued decline in ILD.  Her oxygen use remains the same at 4 L and with exertion she uses 5 L.  She is on Trelegy for asthma.  Her CT scan of the chest in September did not show any evidence of emphysema.  She uses CPAP for her sleep apnea.  Labs from research were reviewed by me.  Her most recent hemoglobin is 12.  Creatinine and liver function test was normal.  Calcium is not available.   Of note after starting the inhaled nitric oxide she feels she can breathe more deeper.  However his symptom score shows continued decline.    OV 04/27/2021  Subjective:  [name], female , DOB: [birth year] ,  age 89 y.o. , MRN: [id number] , ADDRESS: [address] [place] [id number] PCP Lorene Dy, MD Patient Care Team: Lorene Dy, MD as PCP - General (Internal Medicine) End, Harrell Gave, MD as PCP - Cardiology (Cardiology) Gavin Pound, MD as Consulting Physician (Rheumatology)  This Provider for this  visit: Treatment Team:  Attending Provider: Brand Males, MD    04/27/2021 -   Chief Complaint  Patient presents with   Follow-up    Pt states she has been sick for about 6 weeks. Has been on prednisone as well as abx and is still not over it. States she is coughing getting up yellow-green phlegm and also has had some wheezing. Also has had increased SOB.      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    - Started on Esbriet in March 2020.  Has progressive phenotype. COVID negative 04/25/2019,. On 02 4L [place] since end 2019   - on ild pro registry  - started PULSE iNO study march 2022 via study protocol -randomized trial -> randomized to Open Label in sept 2022  - [place] 11/09/20   -   RA = 12 RV = 52/14 PA = 55/25 (38) PCW = 19 Fick cardiac output/index = 5.8/3.1 Thermo CO/CI = 7.4/4.0 PVR = 3.3 WU (Fick) 2.6 WU (thermo) FA sat = 99% PA sat = 71%, 71% SVC sat = 76% Given relatively normal PVR may not benefit much from Tyvas - per cads    Also hx of asthma -Singulair and inhaler trelegy  - 0% eoios last checked 2019  Also hx of chronic sinusitis  - 2017 CT maxillary sinusits - DR Wilburn Cornelia  - CT sinus nov 2020 - celar  History of sleep apnea on CPAP  Rheumatoid arthritis seen by Dr. Gavin Pound on leflunomide, Plaquenil and daily prednisone 5 mg/day.  Positive Covid IgG - Feb 2022   HPI [name] 77 y.o. -presents for standard of care visit to the ILD center with Dr. Chase [name].  She presents with her husband.  She tells me that since summer 2022 she is having increased sinus drainage.  For the last 6 weeks she is got more cough.  She feels there is a frog in the throat.  She brings yellow drainage from the sinuses.  The drainage increases with Nettie pot saline wash.  Sometimes she is clearing this from the throat.  Her husband says this happens every winter.  2 years ago we did CT scan of the sinus and it was  clear.  She has been on 2 antibiotics.  She felt cephalexin and azithromycin did not help.  She prefers to have doxycycline.  She has been on 1 prednisone 10-day taper.  She feels that she wants another course of doxycycline and prednisone.  She also states that if she takes antibiotics she will want Diflucan.  Her last sinus CT was 2 years ago and her last CT chest was 1 year ago.  She also reports cough and chest tightness.  Worsening shortness of breath.  Despite subjective reporting of worsening shortness of breath subjective objective dyspnea score on the scoring scale looks the same  She continues on antifibrotic pirfenidone.  She is tolerating this well.  She is also on inhaled nitric oxide open label with the oxygen.  Her pulmonary function test shows continued decline.  Because of her symptoms we did a nitric oxide exhaled test and this was high.  Unclear if this because  of inhaled nitric oxide.  Research of the study sponsor on this.    Lab Results  Component Value Date   NITRICOXIDE 139 04/27/2021   Xxxxxxxxxxxxxxxxxxxxxx  05/24/2021 -  Followup Test results - video visit   HPI [name] 77 y.o. - standard of care visit to discuss CT  and lab results  Per research team:  She rated her shortness of breath today at a 5 out of 5 today and has been since July 18th, before it was 4 out of 5. Her Borg Dyspnea score was 3 (moderate) at rest the last two visits and before that only rated it 1 (slight) back in June. On 6wmd: he walked 153 meters today compared to 120 meters on 03/29/2021. SHe is currently on Open Labvel extensio of the inhaled NO Study-she was able to walk just since 6 L.  She is now on scheduled to see research team every 4 months.   34md  - 14m start of study - 167 m I June 2022 - 120 m sept 2022 -> open labe inhaled NO rolled over July 2022 - 1585m1/7/22 - needed 6L . -> improved  Labs  - ANCA - normal - IgE and RAST allergy panel negative - PFT shows  continued decline    Overall it appears based on pulmonary function test and CT scan of the chest she is declined significantly.  She also has severe tracheobronchomalacia which has not been documented in the prior CT scan.  I did discuss the progression with her.  She indicated to me that she was worried about her progression.  She appears to be on optimal antifibrotic therapy with pirfenidone.  We reviewed her rheumatoid arthritis medication she no longer on Plaquenil.  Recent documented above.  Subsequently did discuss with Dr. AnGavin Pound We considered possibility of using Actemra which in scleroderma ILD is shown lung stabilization/modulation effect.  She will try to make a switch for this if insurance would approve.  Did indicate to patient that if she continued to decline then we would involve palliative care at home.  At this point in time we resolved that she will continue with the research protocol. The tracheobronchomalacia is the likely reason for exertional wheezing.      OV 09/16/2021  Subjective:  Patient ID: DoBarrie Dunkerfemale , DOB: 9/09-Apr-1946 age 77.o. , MRN: [id number] [address]: [address] [id number] RoLorene DyMD Patient Care Team: RoLorene DyMD as PCP - General (Internal Medicine) End, ChHarrell GaveMD as PCP - Cardiology (Cardiology) HaGavin PoundMD as Consulting Physician (Rheumatology)  This Provider for this visit: Treatment Team:  Attending Provider: RaBrand MalesMD    09/16/2021 -   Chief Complaint  Patient presents with   Follow-up    Pt states her breathing has become better since stopping the nitric oxide that she was doing with research. States she does have an occasional cough.     HPI DoKIMERLY ROWAND89.o. -presents with her husband.  On 08/24/2021 she completely withdrew from the inhaled nitric oxide open label extension part of the study.  The coordinator did mention that to me back then.  She  is simply not interested in the study.  In fact she refused even participate in the follow-up visits without the investigational medical product.  She just wants to be done with it.  She was frustrated with the device in the machine and the noise.  In addition she felt that the inhaled nitric oxide was making the sinus situation worse.  She believes that after going to open label extension she got worse.  Since stopping the inhaler anticoagulate her sinus complaints are better.  She feels also is that she is less short of breath.  Of note she has now had 3 doses once monthly of Actemra for rheumatoid arthritis.  Correlating with this the husband notices that she is using her cane less and she is in less pain and she is moving better. For chronic respiratory failure she uses 6 L nasal cannula continuous at rest and with exertion.  Overall she feels better.  Symptom score is below.  She still continues to participate in the ILD-Pro registry and today she has a visit for that.        OV 12/08/2021  Subjective:  [name], female , DOB: 10-Aug-1945 , age 12 y.o. , MRN: [id number] , ADDRESS: [address] [place] [id number] PCP Lorene Dy, MD Patient Care Team: Lorene Dy, MD as PCP - General (Internal Medicine) End, Harrell Gave, MD as PCP - Cardiology (Cardiology) Gavin Pound, MD as Consulting Physician (Rheumatology)  This Provider for this visit: Treatment Team:  Attending Provider: Brand Males, MD    12/08/2021 -   Chief Complaint  Patient presents with   Follow-up    PFT performed today.  Pt states she feels like her breathing has gotten better since last visit.    [name] 77 y.o. - presemnt wth husband. Reports now needing 7L based on subjective needs. Taking mucinex. Cough some more productive than useal but main thing is dyspnea  scor worse. Last RHC over a year ago.  Taking esbriet, actemra, prednisone and arava. Uses  walker. HAs Left hip pain and also left shoulder pain. Not a surgical candidate. PFT today shows some decline compared to a year ago. Explained to her that while on iNO study her PFT actually improved in the short run. She finds that difficult to fathom because she did not tolerate the OLE   12/24/2021: Virtual visit with Volanda Napoleon NP post [place] for [facility]. She had mild to moderate PAH with elevated PVR. Started on Tyvaso DPI. Continued on Esbriet 801 mg tid with plans to repeat HRCT in October 2023. Continued on Trelegy and PRN nebs/albuterol for COPD. Excellent compliance with CPAP use. O2 stable on 7 lpm supplemental O2. Follow up in one month and then in August with Dr. Chase [name].   03/17/2022: Today - follow up Patient presents today with husband for follow up after repeat pulmonary function testing, which were overall stable. She started on Tyvaso about 2 weeks ago. She does feel like it has helped her breathing and like she doesn't have as much chest congestion/tightness. She still has very high symptom burden with limited activity tolerance. She's unable to Hailey much around the house besides basic ADLs. Her husband is very supportive. They Hailey go out to eat for lunch often and she gets winded walking from the parking lot to the oxygen. Cough is minimal. No wheezing, hemoptysis, fevers, increased leg swelling, orthopnea (on CPAP), PND. She continues on supplemental oxygen 7 lpm and maintains oxygen levels in the 90's. She wears CPAP nightly. Denies morning headaches or drowsy driving.     OV 43/15/4008  Subjective:  [name], female , DOB: 1945/12/09 , age 60 y.o. , MRN: [id number] , ADDRESS: [address] [place] [id number] PCP  Lorene Dy, MD Patient Care Team: Lorene Dy, MD as PCP - General (Internal Medicine) End, Harrell Gave, MD as PCP - Cardiology (Cardiology) Gavin Pound, MD as Consulting Physician (Rheumatology)  This Provider for this visit: Treatment  Team:  Attending Provider: Brand Males, MD    06/07/2022 -   Chief Complaint  Patient presents with   Follow-up    PFT performed today.  Pt states she has been worse since last visit. Was recently in the hospital due to cellulitis of left leg.   HPI [name] 77 y.o. -returns for follow-up.  Have not seen her since the spring 2023.  Since then she continues to be on pirfenidone.  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  She is on the full dose but she forgets to take it half the time so she takes DPI 2 times daily.  In October 2023 she got hospitalized for nonhealing ulcers and bad cellulitis of bilateral shin.  Notes reviewed.  Husband showed pictures.  They are slowly improving although the left lower extremity shin is still nonhealing.  Her oxygen use continues to be at 7 L/min.  Her pulmonary function test shows continued decline.  She continues to be on Symbicort and other medications.  In terms of her rheumatoid arthritis Actemra is on hold but she is on leflunomide prednisone    OV 08/11/2022  Subjective:  [name], female , DOB: [birth year] , age 76 y.o. 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    - [facility] HRCT Oct 2022  - Started on Esbriet in March 2020.   Has progressive phenotype. COVID negative 04/25/2019,.   - on ild pro registry  - started PULSE iNO study march 2022 via study protocol -randomized trial -> randomized to Open Label in sept 2022 -> stopped 08/24/21 due to sinus congestion and inconvieniece     -      Also hx of chronic sinusitis  - 2017 CT maxillary sinusits - DR SWilburn Cornelia - CT sinus nov 2020 and October 2022- celar  - normal ANCA panel Oct 2022  History of sleep apnea on CPAP  Rheumatoid arthritis seen by Dr. AGavin Poundon -  leflunomide,  - daily prednisone 5 mg/day.  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 Pt si currenlty on Trelegy daily and she states it is working well. Pt is also on Esbriet, and not having any side effects so far. Pt is on 8L of 02      HPI [name] 77 y.o. -returns for follow-up.  She continues to be on 7 L oxygen.  Her husband is providing independent history.  Husband himself is suffering from severe back pain.  They both feel she is stable.  He does say that after Dr. Visit she continues to get extremely exhausted but she is  managed with 7 L.  The left lower extremity shin ulcer is still not healed.  Actemra continues to be on hold.  The husband showed me pictures.  Currently the left lower extremity is in a wrap.  It still look like 6 cm vertical ulcer with delayed wound healing but according to the husband the wound care center did indicate to them that it is healing well.  The Actemra is on hold because of this.  I did indicate to her that the medication is probably delaying wound healing is the pirfenidone because it is an antifibrotic.  However she does not want to stop this at this stage because partly encouraged by the wound care opinion that the wound is healing but also she is worried about implications of her pulmonary fibrosis failure if antifibrotic's start.  I did agree with this sentiment.  Last set of labs reviewed and they were in the fall 2023.  Today she will have repeat set of labs.   Of note: I did review the external records from 08/05/2022 at the wound care center by [facility].  She still has not gotten covid booster and RSV vaccine  SYMPTOM SCALE - ILD 03/15/2019   05/17/2019   04/02/2020   05/13/2020     08/19/2020 covid 10/15/2020   04/27/2021 210# 09/16/2021   12/08/2021 204# 03/17/2022 06/07/2022  08/11/2022   O2 use x           5L [place] pule - ino study 6L [place] . Off iNO study 7L [place] wth home concerntator 7 L [place] 7L [place] 7L [place]  Shortness of Breath 0 -> 5 scale with 5 being worst (score 6 If unable to Hailey)                     At rest _0 4   Simple tasks - showers, clothes change, eating, shaving _1 3   Household (dishes, doing bed, laundry) _2 Shopping _3 Walking level at own pace _4 Walking up Stairs _5 Total (40 - 48) Dyspnea Score 18 17  _0 How bad is your cough? _1 0 _2 How bad is your fatigue _3 nauea     0 0 0 0  0 0 0 0 3   vomit     0 0 0 0 0 0 0 0 0   diarrhea     _4 anxiety     _5 depression     _6 PFT     Latest Ref Rng & Units 06/07/2022    9:49 AM 03/17/2022   11:22 AM 12/08/2021    3:01 PM 05/24/2021   12:43 PM 03/29/2021    2:07 PM 02/02/2021    8:12 AM 12/08/2020    9:03 AM  PFT Results  FVC-Pre L 1.47  1.53  P 1.38  1.44  1.54  2.00  1.45   FVC-Predicted Pre % 63  64  P 58  60  63  82  63   Pre FEV1/FVC % % 77  76  P 80  74  79  63  78   FEV1-Pre L 1.13  1.16  P 1.10  1.06  1.21  1.26  1.14   FEV1-Predicted Pre % 65  65  P 62  60  67  69  66   DLCO uncorrected ml/min/mmHg 6.18  6.69  P 7.13  8.17  6.95  9.22  9.05   DLCO UNC% % 36  39  P 41  47  40  53  54   DLCO corrected ml/min/mmHg 6.28  6.69  P 7.13       DLCO COR %Predicted % 36  39  P 41       DLVA Predicted % 67  71  P 74  76  72  85  96     P Preliminary result       has a past medical history of Acute asthmatic bronchitis, Allergic rhinitis, Anxiety, Arthritis, Esophageal reflux, Hypertension, Interstitial lung disease (HCC) (dx jan 2020), Irritable bowel syndrome, Oxygen dependent, PONV (postoperative nausea and vomiting), Pulmonary hypertension (HCC), Rheumatoid arthritis(714.0), and Sleep apnea.   reports that she has never smoked. She has never used smokeless tobacco.  Past Surgical History:  Procedure Laterality Date    c secttion  1977   2 foot fusions Left    total of 6 left foot sx   ABDOMINAL HYSTERECTOMY     complete    ANKLE FUSION  2009   left   BACK SURGERY     lower l to l 5 fused   CHOLECYSTECTOMY     RIGHT HEART CATH N/A 11/09/2020   Procedure: RIGHT HEART CATH;  Surgeon: Jolaine Artist, MD;  Location: [facility];  Service: Cardiovascular;  Laterality: N/A;   RIGHT/LEFT HEART CATH AND CORONARY ANGIOGRAPHY N/A 09/16/2016   Procedure: Right/Left Heart Cath and Coronary Angiography;  Surgeon: Nelva Bush, MD;  Location: [facility];  Service: Cardiovascular;  Laterality: N/A;   RIGHT/LEFT HEART CATH AND CORONARY ANGIOGRAPHY N/A 12/17/2021   Procedure: RIGHT/LEFT HEART CATH AND CORONARY ANGIOGRAPHY;  Surgeon: Jolaine Artist, MD;  Location: [facility];  Service: Cardiovascular;  Laterality: N/A;   TONSILLECTOMY     TOTAL HIP ARTHROPLASTY Right 12/20/2018   Procedure: TOTAL HIP ARTHROPLASTY ANTERIOR APPROACH;  Surgeon: Paralee Cancel, MD;  Location: [facility];  Service: Orthopedics;  Laterality: Right;  70 mins   TOTAL KNEE ARTHROPLASTY Bilateral     Allergies  Allergen Reactions   Cefdinir Diarrhea   Erythromycin Base Diarrhea   Lactose Dermatitis   Lactulose Diarrhea   Mesalamine Nausea Only   Methotrexate Other (See Comments)    Felt sick   Nitrofuran Derivatives     shakiness   Other Diarrhea and Other (See Comments)    Shaking uncontrollably "lettuce only" "lettuce only"   Sulfa Antibiotics Other (See Comments)    Patient can't recall reaction    Tdap [Tetanus-Diphth-Acell Pertussis]     Shaking uncontrollably   Tetanus Toxoid, Adsorbed Other (See Comments)    Shaking uncontrollable     Immunization History  Administered Date(s) Administered   Fluad Quad(high Dose 65+) 03/15/2019   Influenza Split 03/18/2012, 03/18/2013, 04/14/2014, 04/17/2017   Influenza Whole 04/30/2008, 04/20/2009, 04/18/2011   Influenza, High Dose Seasonal PF 04/23/2015, 03/24/2016, 04/06/2018, 04/02/2020, 03/24/2021, 04/13/2022   Influenza,inj,quad, With Preservative 04/06/2018   PFIZER(Purple Top)SARS-COV-2 Vaccination 08/24/2019, 09/18/2019, 04/13/2020   Pneumococcal Conjugate-13 11/11/2013   Pneumococcal Polysaccharide-23 05/21/2018   Zoster Recombinat (Shingrix) 05/05/2017    Family History  Problem Relation Age of Onset   Heart disease Mother    Arthritis Mother    Heart attack Father    Diabetes Other        sibling   Heart attack Other        sibling     Current Outpatient Medications:     albuterol (VENTOLIN HFA) 108 (90 Base) MCG/ACT inhaler, USE 2 INHALATIONS BY MOUTH  EVERY 6 HOURS AS NEEDED (Patient taking differently: Inhale 2 puffs into the lungs every 6 (six) hours as needed for wheezing or shortness of breath.), Disp: 34 g, Rfl: 3   aspirin EC 81 MG tablet, Take 81 mg by mouth daily., Disp: , Rfl:    benzonatate (TESSALON) 200 MG capsule, TAKE 1 CAPSULE (200 MG TOTAL) BY MOUTH 3 (THREE) TIMES DAILY AS NEEDED FOR COUGH., Disp: 90 capsule, Rfl: 2   cetirizine (ZYRTEC) 10 MG tablet, Take 20 mg by mouth daily., Disp: , Rfl:    Cholecalciferol (VITAMIN D3) 250 MCG (10000 UT) capsule, Take 10,000 Units by mouth daily., Disp: , Rfl:    clidinium-chlordiazePOXIDE (LIBRAX) 5-2.5 MG capsule, Take 1 capsule by mouth 3 (three) times daily as needed (for IBS)., Disp: , Rfl:    colestipol (COLESTID) 1 g tablet, Take 1 g by mouth daily., Disp: , Rfl:    dicyclomine (BENTYL) 20 MG tablet, Take 20 mg by mouth 4 (four) times daily as needed (IBS)., Disp: , Rfl:    FLUoxetine (PROZAC) 20 MG capsule, Take 20 mg by mouth daily., Disp: , Rfl:    fluticasone (FLONASE) 50 MCG/ACT nasal spray, SPRAY 2 SPRAYS INTO EACH NOSTRIL EVERY DAY (Patient taking differently: Place 2 sprays into both nostrils daily.), Disp: 48 mL, Rfl: 3   furosemide (LASIX) 40 MG tablet, TAKE 1 TABLET BY MOUTH DAILY, Disp: 90 tablet, Rfl: 1   ipratropium (ATROVENT) 0.03 % nasal spray, USE 1 TO 2 SPRAYS IN BOTH  NOSTRILS TWICE DAILY, Disp: 90 mL, Rfl: 3   ipratropium-albuterol (DUONEB) 0.5-2.5 (3) MG/3ML SOLN, USE 1 VIAL IN NEBULIZER EVERY 6 HOURS AS NEEDED J45.901 (Patient taking differently:  Take 3 mLs by nebulization every 6 (six) hours as needed (wheezing, shortness of breath).), Disp: 270 mL, Rfl: 11   KLOR-CON M20 20 MEQ tablet, TAKE 1 TABLET BY MOUTH TWICE A DAY, Disp: 180 tablet, Rfl: 0   leflunomide (ARAVA) 20 MG tablet, Take 1 tablet (20 mg total) by mouth daily., Disp: , Rfl:    loperamide (IMODIUM) 2 MG capsule,  Take 1 capsule (2 mg total) by mouth 2 (two) times daily as needed for diarrhea or loose stools., Disp: 30 capsule, Rfl: 0   Menthol, Topical Analgesic, (BIOFREEZE COOL THE PAIN) 4 % GEL, Apply 1 Application topically 2 (two) times daily as needed (pain)., Disp: , Rfl:    methocarbamol (ROBAXIN) 750 MG tablet, Take 750 mg by mouth 2 (two) times daily as needed for muscle spasms., Disp: , Rfl:    montelukast (SINGULAIR) 10 MG tablet, TAKE 1 TABLET BY MOUTH AT  BEDTIME, Disp: 90 tablet, Rfl: 3   mupirocin ointment (BACTROBAN) 2 %, Apply topically daily., Disp: 22 g, Rfl: 0   omeprazole (PRILOSEC) 40 MG capsule, Take 40 mg by mouth at bedtime. , Disp: , Rfl:    Oxycodone HCl 10 MG TABS, Take 10 mg by mouth 3 (three) times daily., Disp: , Rfl:    OXYGEN, Inhale 7 L into the lungs continuous., Disp: , Rfl:    Pirfenidone (ESBRIET) 801 MG TABS, Take 801 mg by mouth 3 (three) times daily., Disp: 270 tablet, Rfl: 3   Polyethyl Glycol-Propyl Glycol (SYSTANE) 0.4-0.3 % GEL ophthalmic gel, Place 2 application  into both eyes 2 (two) times daily as needed (dry eyes)., Disp: , Rfl:    predniSONE (DELTASONE) 5 MG tablet, Take 5 mg by mouth daily with breakfast., Disp: , Rfl:    rosuvastatin (CRESTOR) 20 MG tablet, TAKE 1 TABLET BY MOUTH DAILY, Disp: 90 tablet, Rfl: 0   spironolactone (ALDACTONE) 25 MG tablet, Take 0.5 tablets (12.5 mg total) by mouth daily., Disp: 30 tablet, Rfl: 0   Tocilizumab (ACTEMRA IV), Inject into the vein every 30 (thirty) days. infusion, Disp: , Rfl:    traMADol (ULTRAM) 50 MG tablet, Take 50 mg by mouth 3 (three) times daily as needed for moderate pain., Disp: , Rfl:    traZODone (DESYREL) 100 MG tablet, Take 100 mg by mouth at bedtime., Disp: , Rfl:    TRELEGY ELLIPTA 100-62.5-25 MCG/ACT AEPB, USE 1 INHALATION BY MOUTH DAILY (Patient taking differently: Inhale 1 puff into the lungs daily.), Disp: 180 each, Rfl: 3   TYVASO DPI MAINTENANCE KIT 64 MCG POWD, Inhale 1 puff into the lungs in  the morning, at noon, in the evening, and at bedtime., Disp: , Rfl:    Verapamil HCl CR 300 MG CP24, Take 1 capsule (300 mg total) by mouth daily., Disp: 90 capsule, Rfl: 1      Objective:   Vitals:   08/11/22 0946  BP: 122/64  Pulse: 78  Temp: 98.9 F (37.2 C)  TempSrc: Oral  SpO2: 95%  Weight: 182 lb (82.6 kg)  Height: _0  (1.549 m)    Estimated body mass index is 34.39 kg/m as calculated from the following:   Height as of this encounter: _1  (1.549 m).   Weight as of this encounter: 182 lb (82.6 kg).  _2 @  [facility]   08/11/22 0946  Weight: 182 lb (82.6 kg)     Physical Exam    General: No distress.  Obese lady sitting in the wheelchair.  7 L  oxygen on. Neuro: Alert and Oriented x 3. GCS 15. Speech normal Psych: Pleasant Resp:  Barrel Chest - no.  Wheeze - no, Crackles - no, No overt respiratory distress CVS: Normal heart sounds. 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 PEERL +. No post nasal drip        Assessment:       ICD-10-CM   1. ILD (interstitial lung disease) (HCC)  J84.9 Hepatic function panel    CBC w/Diff    Basic Metabolic Panel (BMET)    Basic Metabolic Panel (BMET)    CBC w/Diff    Hepatic function panel    2. Pulmonary hypertension (HCC)  I27.20 B Nat Peptide    B Nat Peptide    3. Shortness of breath  R06.02 B Nat Peptide    B Nat Peptide    4. Chronic respiratory failure with hypoxia (HCC)  J96.11     5. High risk medication use  Z79.899     6. Vaccine counseling  Z71.85          Plan:     Patient Instructions     ICD-10-CM   1. Chronic respiratory failure with hypoxia (HCC)  J96.11     2. ILD (interstitial lung disease) ([place])  J84.9     3. Pulmonary hypertension (HCC)  I27.20     4. Obstructive sleep apnea  G47.33     5. Therapeutic drug monitoring  Z51.81     6. High risk medication use  Z79.899     7. Research subject  Z00.6     8. Goals  of care, counseling/discussion  Z71.89     9. Vaccine counseling  Z71.85        -Progressive interstitial lung disease - clinically slightly progressive since May 2022 -> stable clnically on 7L [place] as of Jan 2024 - Pulm hypertension since June 2023 and on tyvaso  - Left lowre extremity Ulcer oct 2023 and slowly healing with hold on actemra  - Plan ---Continue using oxygen 7 L at rest and  more  with exertion  -Monitor pulse ox with exertion and keep it over 86% -Continue participation in ILD-pro research registry -Continue pirfenidone   - if leg ulcers never heal we  might have to stop this for a month but at this point benefit of taking outweighs risk of stopping - continue tyvaso - Continue RA Rx with Dr. Gavin Pound: -  , leflunomade, prednisone, flonase, celebrex - restart actemra when medically feasible ; I am ok restarting it even if leg ulcers are not fully but nearly healed - check cbc, bmet, LFT, and BNP 08/11/2022   Obstructive sleep apnea  Plan -Continue CPAP  Chrnonic persistnet sinusitis    - In control   Plan - continue regular flonase, and netti pot, and atrovent nasal spray snf cetrizine   Asthma/tracheobronchomalacia -Not in flareup.    Plan  - continue trelegy scheduled  - use alb as needed  VAccin counseling    Plan'  - recommend RSV and covid vaccine   Followup - 3 month  face to face  -30-minute visit Dr. Chase [name] ; symptm score at folowup  Moderate Complexity MDM NEW OFFICE  The table below is from the 2021 E/M guidelines, first released in 2021, with minor revisions added in 2023. Must meet the requirements for 2 out of 3 dimensions to qualify.    Number and complexity of problems addressed Amount and/or  complexity of data reviewed Risk of complications and/or morbidity  One or more chronic illness with mild exacerbation, progression, or side effects of treatment  Two or more stable chronic illnesses  One undiagnosed new  problem with uncertain prognosis  One acute illness with systemic symptoms   Acute complicated injury Must meet the requirements for 1 of 3 of the categories)  Category 1: Tests and documents, historian  Any combination of 3 of the following:  Assessment requiring an independent historian  Review of prior external records  Review of results of each unique test  Ordering of each unique test    Category 2: Interpretation of tests  Independent interpretation of a test perfromed by another physician/NPP  Category 3: Discuss management/tests  Discussion of magagement or tests with an external physician/NPP Prescription drug management  Decision regarding minor surgery with identfied patient or procedure risk factors  Decision regarding elective major surgery without identified patient or procedure risk factors  Diagnosis or treatment significantly limited by social determinants of health                    SIGNATURE    Dr. Brand Males, M.D., F.C.C.P,  Pulmonary and Critical Care Medicine Staff Physician, [place] Director - Interstitial Lung Disease  Program  Pulmonary [place] [facility] [place], [place], [address]  Pager: [phone], If no answer or between  15:00h - 7:00h: call [phone] Telephone: [phone]  2:11 PM 08/11/2022

## 2022-08-11 NOTE — Telephone Encounter (Signed)
Rx team - - patient says she is out of esbriet  THanks    SIGNATURE    Dr. Brand Males, M.D., F.C.C.P,  Pulmonary and Critical Care Medicine Staff Physician, [place] Director - Interstitial Lung Disease  Program  Medical Director - [facility] [place] [facility] [place], [place], [address]   Pager: [phone], If no answer  -[place] or [place] [phone] Telephone (clinical office): [phone] Telephone (research): [phone]  10:21 AM 08/11/2022

## 2022-08-11 NOTE — Patient Instructions (Addendum)
ICD-10-CM   1. Chronic respiratory failure with hypoxia (HCC)  J96.11     2. ILD (interstitial lung disease) ([place])  J84.9     3. Pulmonary hypertension (HCC)  I27.20     4. Obstructive sleep apnea  G47.33     5. Therapeutic drug monitoring  Z51.81     6. High risk medication use  Z79.899     7. Research subject  Z00.6     8. Goals of care, counseling/discussion  Z71.89     9. Vaccine counseling  Z71.85        -Progressive interstitial lung disease - clinically slightly progressive since May 2022 -> stable clnically on 7L [place] as of Jan 2024 - Pulm hypertension since June 2023 and on tyvaso  - Left lowre extremity Ulcer oct 2023 and slowly healing with hold on actemra  - Plan ---Continue using oxygen 7 L at rest and  more  with exertion  -Monitor pulse ox with exertion and keep it over 86% -Continue participation in ILD-pro research registry -Continue pirfenidone   - if leg ulcers never heal we  might have to stop this for a month but at this point benefit of taking outweighs risk of stopping - continue tyvaso - Continue RA Rx with Dr. Gavin Pound: -  , leflunomade, prednisone, flonase, celebrex - restart actemra when medically feasible ; I am ok restarting it even if leg ulcers are not fully but nearly healed - check cbc, bmet, LFT, and BNP 08/11/2022   Obstructive sleep apnea  Plan -Continue CPAP  Chrnonic persistnet sinusitis    - In control   Plan - continue regular flonase, and netti pot, and atrovent nasal spray snf cetrizine   Asthma/tracheobronchomalacia -Not in flareup.    Plan  - continue trelegy scheduled  - use alb as needed  VAccin counseling    Plan'  - recommend RSV and covid vaccine   Followup - 3 month  face to face  -30-minute visit [name] ; symptm score at folowup

## 2022-08-11 NOTE — Progress Notes (Signed)
[name], [name] ([id number]) [id number] Page 1 of 11 Visit Report for 08/11/2022 Chief Complaint Document Details Patient Name: Date of Service: [place]. 08/11/2022 12:30 PM Medical Record Number: [id number] Patient Account Number: [id number] Date of Birth/Sex: Treating RN: January 27, 1946 (77 y.o. F) Primary Care Provider: Lorene Dy Other Clinician: Referring Provider: Treating Provider/Extender: [name] in Treatment: 9 Information Obtained from: Patient Chief Complaint 06/03/2022; Left lower extremity wounds Electronic Signature(s) Signed: 08/11/2022 2:31:01 PM By: Hailey Shan DO Entered By: [name] on 08/11/2022 13:33:47 -------------------------------------------------------------------------------- Debridement Details Patient Name: Date of Service: [place]. 08/11/2022 12:30 PM Medical Record Number: [id number] Patient Account Number: [id number] Date of Birth/Sex: Treating RN: 21-Oct-1945 (77 y.o. [name] Primary Care Provider: Lorene Dy Other Clinician: Referring Provider: Treating Provider/Extender: [name] in Treatment: 9 Debridement Performed for Assessment: Wound #5 Left,Distal,Anterior Lower Leg Performed By: Physician Hailey Shan, DO Debridement Type: Debridement Level of Consciousness (Pre-procedure): Awake and Alert Pre-procedure Verification/Time Out Yes - 13:21 Taken: Start Time: 13:22 Pain Control: Lidocaine 5% topical ointment T Area Debrided (L x W): otal 4.9 (cm) x 3 (cm) = 14.7 (cm) Tissue and other material debrided: Non-Viable, Slough, Slough Level: Non-Viable Tissue Debridement Description: Selective/Open Wound Instrument: Curette Bleeding: Minimum Hemostasis Achieved: Pressure Procedural Pain: 0 Post Procedural Pain: 0 Response to Treatment: Procedure was tolerated well Level of Consciousness (Post-  Awake and Alert procedure): Post Debridement Measurements of Total Wound Length: (cm) 4.9 Width: (cm) 3 Depth: (cm) 0.2 Volume: (cm) 2.309 Character of Wound/Ulcer Post Debridement: Improved Post Procedure Diagnosis Same as Pre-procedure KORTLYN, KOLTZ ([id number]) [id number] Page 2 of 11 Notes Scribed for Dr Heber [place] by Sharyn Creamer, RN Electronic Signature(s) Signed: 08/11/2022 2:31:01 PM By: Hailey Shan DO Signed: 08/11/2022 5:00:09 PM By: Sharyn Creamer RN, BSN Entered By: Sharyn Creamer on 08/11/2022 13:24:00 -------------------------------------------------------------------------------- HPI Details Patient Name: Date of Service: Hailey Courser, DO [name] 08/11/2022 12:30 PM Medical Record Number: [id number] Patient Account Number: [id number] Date of Birth/Sex: Treating RN: March 25, 1946 (77 y.o. F) Primary Care Provider: Lorene Dy Other Clinician: Referring Provider: Treating Provider/Extender: [name] in Treatment: 9 History of Present Illness HPI Description: Patient presents today for initial evaluation and our clinic as a referral from the [facility] Department of orthopedics for evaluation and treatment of the wound to his his of the left foot at the base of the great toe. Currently the good news is the patient really does not have any significant pain which is excellent. She has been seen by Dr. Linus Salmons at [facility]: infectious disease clinic that is the regional Center for infectious disease. Subsequently the patient was cultured and positive for methicillin sensitive staph aureus. She has been on antibiotics including Cipro prior to surgery as will several days following surgery. She then had clindamycin which was changed to doxycycline. She took this for 10 days. Subsequently the pain had improved and there was no longer any possible draining from the wound according to the notes. The patient  sedentary rate as well as C-reactive protein have returned to normal ranges. Currently per Dr. Henreitta Leber assessment there was one dehiscence with no sign of osteomyelitis. He therefore discontinue the antibiotics with the completion of the last three days Of doxycycline. Other than that he just set her for a follow-up as needed. The patient does not have diabetes and is not a current smoker. At this time  her treatment that was recommended by the surgeon was applying a Hydrocolloid dressing. Based on what I'm seeing at this point the patient actually has some Slough noted over the surface of the wound there really does not appear to be evidence of infection though I do think she does require some sharp debridement to clear away the slough and help with appropriate wound healing. She does have areas of granulation buds noted. She may be a candidate for a skin substitute as well. We will see how things do over the next period of time until follow-up. No fevers, chills, nausea, or vomiting noted at this time. 06/13/18 on evaluation today patient actually appears to be doing better in regard to her toe ulcer. She's been using the Prisma on this region and that seems to have done extremely well for her. Fortunately there does not appear to be evidence of infection at this time which is good news. She did see her surgeon they were extremely pleased with the overall appearance of her wound. 06/27/18 on evaluation today patient actually appears to be doing very well in regard to her foot ulcer. She has been tolerating the dressing changes without complication which is great news. She did see her podiatrist they felt like everything is looking very nice as well and will please. 07/05/2018 surgical wound on the left foot. She appears to be doing well. Still surface debrided to remove however in general post debridement the wound bed looks quite healthy it is come down significantly in terms of dimensions using  silver collagen. The patient describes pain when her foot is elevated either in bed at night [sometimes keeps her awake] or when is propped up on a foot rest. She basically takes analgesics for this. She does not really describe claudication with activity however her activity is very limited by interstitial lung disease. Her ABIs initially in this clinic were noncompressible. She is not a diabetic 07/20/2018; surgical wound on the left foot. Wound actually looks quite a bit better than I remember seeing this. She is still describing pain with her leg elevated at night that she does not get when the wound is supine. She does not really describe claudication but she is very limited by her pulmonary status. We have been using silver collagen. 1/17; the patient has been followed up by orthopedics and discharge. Her arterial studies were actually quite good and should not be contributing to any pain. Slight reduction in ABIs but otherwise normal. Her wound is closed. She saw Dr. Linus Salmons of infectious disease surrounding the surgery. By review of our notes she was not felt to have osteomyelitis. 06/03/2022 Ms. Rashunda Passon is a 77 year old female with a past medical history of interstitial lung disease on chronic oxygen via nasal cannula, rheumatoid arthritis, chronic diastolic heart failure and OSA that presents to the clinic for a 1 to 106-monthhistory of nonhealing ulcer to the left lower extremity. On 04/20/2022 she was admitted to the hospital for left lower extremity cellulitis. She required 8 days of IV vancomycin and Unasyn followed by a 14-day course of Augmentin and doxycycline. She has been using Silvadene cream to the area. She does not use compression therapy but does own compression stockings. She currently denies signs of infection. 11/27; patient presents for follow-up she is using Medihoney and Hydrofera Blue to the wound beds. She has been using her Tubigrip. She has no issues or complaints  today. 12/4; patient presents for follow-up. She continues to use Medihoney and Hydrofera  Blue to the wound beds. Several attempts have been made to schedule ABIs with TBI's. We gave patient the number today to call to have these scheduled. She has been using Tubigrip. She has no issues or complaints today. 12/18; she continues to use Medihoney and Hydrofera Blue in general the surface of the wound is looking somewhat better this week and measurements are slightly smaller. She did have her arterial studies done these were noncompressible bilaterally however TBI on the right of 0.69 on the left 0.62. Waveforms on the right were triphasic on the left biphasic to triphasic. This does not suggest significant arterial disease to make healing wounds at this location difficult. 12/29; patient presents for follow-up. She has been using Medihoney and Hydrofera Blue to the wound bed. She has been using Tubigrip. 1/5; patient presents for follow-up. She is been using Medihoney and Hydrofera Blue to the wound bed along with Tubigrip. The Tubigrip was doubled at last clinic visit. There is been improvement in wound healing. KALYNN, DECLERCQ ([id number]) [id number] Page 3 of 11 1/12; patient presents for follow-up. She has been using Medihoney and Hydrofera Blue to the wound bed along with Tubigrip. There continues to be improvement in wound healing. 1/19; patient presents for follow-up. She has been using Medihoney and Hydrofera Blue to the wound bed along with her juxta light compression daily. There is been improvement in wound measurements. Patient has no issues or complaints today. 1/25; patient presents for follow-up. She has been using blast X and collagen to the wound bed under juxta light compression daily. She has no issues or complaints today. There is been improvement in wound healing. Electronic Signature(s) Signed: 08/11/2022 2:31:01 PM By: Hailey Shan DO Entered By:  [name] on 08/11/2022 13:34:28 -------------------------------------------------------------------------------- Physical Exam Details Patient Name: Date of Service: Hailey Courser, DO [name] 08/11/2022 12:30 PM Medical Record Number: [id number] Patient Account Number: [id number] Date of Birth/Sex: Treating RN: [birth year] (77 y.o. F) Primary Care Provider: Lorene Dy Other Clinician: Referring Provider: Treating Provider/Extender: [name] in Treatment: 9 Constitutional respirations regular, non-labored and within target range for patient.. Cardiovascular 2+ dorsalis pedis/posterior tibialis pulses. Psychiatric pleasant and cooperative. Notes T the lateral aspect of the left lower extremity there are 2 open wounds. The more distal wound has slough. Good edema control. Obvious deformity to the o foot from previous surgeries. No signs of surrounding soft tissue infection. Electronic Signature(s) Signed: 08/11/2022 2:31:01 PM By: Hailey Shan DO Entered By: [name] on 08/11/2022 13:35:32 -------------------------------------------------------------------------------- Physician Orders Details Patient Name: Date of Service: Hailey Courser, DO [name] 08/11/2022 12:30 PM Medical Record Number: [id number] Patient Account Number: [id number] Date of Birth/Sex: Treating RN: 11/27/1945 (77 y.o. [name] Primary Care Provider: Lorene Dy Other Clinician: Referring Provider: Treating Provider/Extender: [name] in Treatment: 9 Verbal / Phone Orders: No Diagnosis Coding Follow-up Appointments ppointment in 1 week. - Dr. Heber [place] 08/18/2022 130pm Return A ppointment in 2 weeks. - Dr. Heber [place] Return A Other: - [employer] will call you a copay for blastx and collagen. IF too expensive use only medihoney to leg wounds. Anesthetic (In clinic) Topical Lidocaine 5% applied to wound bed NICKOLA, LENIG  ([id number]) [id number] Page 4 of 11 Bathing/ Shower/ Hygiene May shower with protection but do not get wound dressing(s) wet. Protect dressing(s) with water repellant cover (for example, large plastic bag) or a cast cover and may then take shower. Edema Control - Lymphedema /  SCD / Other Elevate legs to the level of the heart or above for 30 minutes daily and/or when sitting for 3-4 times a day throughout the day. Avoid standing for long periods of time. Exercise regularly Compression stocking or Garment 20-30 mm/Hg pressure to: - start wearing the juxtalite HD apply in the morning and remove at night left leg. Non Wound Condition Protect area with: - buttock area with AandD ointment daily Wound Treatment Wound #4 - Lower Leg Wound Laterality: Left, Anterior, Proximal Cleanser: Wound Cleanser (Generic) 1 x Per Day/30 Days Discharge Instructions: Cleanse the wound with wound cleanser prior to applying a clean dressing using gauze sponges, not tissue or cotton balls. Topical: blastx 1 x Per Day/30 Days Discharge Instructions: apply a layer to wound bed. Prim Dressing: Fibracol Plus Dressing, 4x4.38 in (collagen) 1 x Per Day/30 Days ary Discharge Instructions: Moisten collagen over the collagen. Secondary Dressing: ABD Pad, 8x10 (Generic) 1 x Per Day/30 Days Discharge Instructions: Apply over primary dressing as directed. Secured With: [employer], 4.5x3.1 (in/yd) (Generic) 1 x Per Day/30 Days Discharge Instructions: Secure with Kerlix as directed. Secured With: 64M Medipore H Soft Cloth Surgical T ape, 4 x 10 (in/yd) (Generic) 1 x Per Day/30 Days Discharge Instructions: Secure with tape as directed. Compression Stockings: Circaid Juxta Lite Compression Wrap Left Leg Compression Amount: 20-30 mmHG Discharge Instructions: Apply Circaid Juxta Lite Compression Wrap daily as instructed. Apply first thing in the morning, remove at night before bed. Wound #5 -  Lower Leg Wound Laterality: Left, Anterior, Distal Cleanser: Wound Cleanser (Generic) 1 x Per Day/30 Days Discharge Instructions: Cleanse the wound with wound cleanser prior to applying a clean dressing using gauze sponges, not tissue or cotton balls. Topical: blastx 1 x Per Day/30 Days Discharge Instructions: apply a layer to wound bed. Prim Dressing: Fibracol Plus Dressing, 4x4.38 in (collagen) 1 x Per Day/30 Days ary Discharge Instructions: Moisten collagen over the collagen. Secondary Dressing: ABD Pad, 8x10 (Generic) 1 x Per Day/30 Days Discharge Instructions: Apply over primary dressing as directed. Secured With: [employer], 4.5x3.1 (in/yd) (Generic) 1 x Per Day/30 Days Discharge Instructions: Secure with Kerlix as directed. Secured With: 64M Medipore H Soft Cloth Surgical T ape, 4 x 10 (in/yd) (Generic) 1 x Per Day/30 Days Discharge Instructions: Secure with tape as directed. Patient Medications llergies: cefdinir, erythromycin base, lactulose, mesalamine, nitrofuran derivative, nitrofurantoin, lettuce, Sulfa (Sulfonamide Antibiotics), Adacel(Tdap A Adolesn/Adult)(PF), tetanus toxoid, adsorbed Notifications Medication Indication Start End prior to debridement 08/11/2022 lidocaine DOSE topical 5 % ointment - ointment topical once daily Electronic Signature(s) Signed: 08/11/2022 2:31:01 PM By: Hailey Shan DO Entered By: [name] on 08/11/2022 13:35:40 Dil[place], [name] ([id number]) [id number] Page 5 of 11 -------------------------------------------------------------------------------- Problem List Details Patient Name: Date of Service: [name] [name] 08/11/2022 12:30 PM Medical Record Number: [id number] Patient Account Number: [id number] Date of Birth/Sex: Treating RN: 03-24-1946 (77 y.o. F) Primary Care Provider: Lorene Dy Other Clinician: Referring Provider: Treating Provider/Extender: [name] in Treatment: 9 Active Problems ICD-10 Encounter Code Description Active Date MDM Diagnosis L97.828 Non-pressure chronic ulcer of other part of left lower leg with other specified 06/03/2022 No Yes severity I87.312 Chronic venous hypertension (idiopathic) with ulcer of left lower extremity 06/03/2022 No Yes J84.9 Interstitial pulmonary disease, unspecified 06/03/2022 No Yes M06.9 Rheumatoid arthritis, unspecified 06/03/2022 No Yes Inactive Problems Resolved Problems Electronic Signature(s) Signed: 08/11/2022 2:31:01 PM By: Hailey Shan DO Entered By: [name] on 08/11/2022 13:33:37 --------------------------------------------------------------------------------  Progress Note Details Patient Name: Date of Service: [place]. 08/11/2022 12:30 PM Medical Record Number: [id number] Patient Account Number: [id number] Date of Birth/Sex: Treating RN: 06-Jan-1946 (77 y.o. F) Primary Care Provider: Lorene Dy Other Clinician: Referring Provider: Treating Provider/Extender: [name] in Treatment: 9 Subjective Chief Complaint Information obtained from Patient 06/03/2022; Left lower extremity wounds History of Present Illness (HPI) Patient presents today for initial evaluation and our clinic as a referral from the [facility] health system Department of orthopedics for evaluation and treatment of the wound to his his of the left foot at the base of the great toe. Currently the good news is the patient really does not have any significant pain which is excellent. She has been seen by Dr. Linus Salmons at [facility]: infectious disease clinic that is the regional Center for infectious disease. Subsequently the patient was cultured and positive for methicillin sensitive staph aureus. She has been on antibiotics including Cipro prior to surgery as will several days CAMBRIA, OSTEN ([id number]) [id number] Page 6  of 11 following surgery. She then had clindamycin which was changed to doxycycline. She took this for 10 days. Subsequently the pain had improved and there was no longer any possible draining from the wound according to the notes. The patient sedentary rate as well as C-reactive protein have returned to normal ranges. Currently per Dr. Henreitta Leber assessment there was one dehiscence with no sign of osteomyelitis. He therefore discontinue the antibiotics with the completion of the last three days Of doxycycline. Other than that he just set her for a follow-up as needed. The patient does not have diabetes and is not a current smoker. At this time her treatment that was recommended by the surgeon was applying a Hydrocolloid dressing. Based on what I'm seeing at this point the patient actually has some Slough noted over the surface of the wound there really does not appear to be evidence of infection though I do think she does require some sharp debridement to clear away the slough and help with appropriate wound healing. She does have areas of granulation buds noted. She may be a candidate for a skin substitute as well. We will see how things do over the next period of time until follow-up. No fevers, chills, nausea, or vomiting noted at this time. 06/13/18 on evaluation today patient actually appears to be doing better in regard to her toe ulcer. She's been using the Prisma on this region and that seems to have done extremely well for her. Fortunately there does not appear to be evidence of infection at this time which is good news. She did see her surgeon they were extremely pleased with the overall appearance of her wound. 06/27/18 on evaluation today patient actually appears to be doing very well in regard to her foot ulcer. She has been tolerating the dressing changes without complication which is great news. She did see her podiatrist they felt like everything is looking very nice as well and will  please. 07/05/2018 surgical wound on the left foot. She appears to be doing well. Still surface debrided to remove however in general post debridement the wound bed looks quite healthy it is come down significantly in terms of dimensions using silver collagen. The patient describes pain when her foot is elevated either in bed at night [sometimes keeps her awake] or when is propped up on a foot rest. She basically takes analgesics for this. She does not really describe claudication with  activity however her activity is very limited by interstitial lung disease. Her ABIs initially in this clinic were noncompressible. She is not a diabetic 07/20/2018; surgical wound on the left foot. Wound actually looks quite a bit better than I remember seeing this. She is still describing pain with her leg elevated at night that she does not get when the wound is supine. She does not really describe claudication but she is very limited by her pulmonary status. We have been using silver collagen. 1/17; the patient has been followed up by orthopedics and discharge. Her arterial studies were actually quite good and should not be contributing to any pain. Slight reduction in ABIs but otherwise normal. Her wound is closed. She saw Dr. Linus Salmons of infectious disease surrounding the surgery. By review of our notes she was not felt to have osteomyelitis. 06/03/2022 Ms. [name] is a 77 year old female with a past medical history of interstitial lung disease on chronic oxygen via nasal cannula, rheumatoid arthritis, chronic diastolic heart failure and OSA that presents to the clinic for a 1 to 85-monthhistory of nonhealing ulcer to the left lower extremity. On 04/20/2022 she was admitted to the hospital for left lower extremity cellulitis. She required 8 days of IV vancomycin and Unasyn followed by a 14-day course of Augmentin and doxycycline. She has been using Silvadene cream to the area. She does not use compression therapy  but does own compression stockings. She currently denies signs of infection. 11/27; patient presents for follow-up she is using Medihoney and Hydrofera Blue to the wound beds. She has been using her Tubigrip. She has no issues or complaints today. 12/4; patient presents for follow-up. She continues to use Medihoney and Hydrofera Blue to the wound beds. Several attempts have been made to schedule ABIs with TBI's. We gave patient the number today to call to have these scheduled. She has been using Tubigrip. She has no issues or complaints today. 12/18; she continues to use Medihoney and Hydrofera Blue in general the surface of the wound is looking somewhat better this week and measurements are slightly smaller. She did have her arterial studies done these were noncompressible bilaterally however TBI on the right of 0.69 on the left 0.62. Waveforms on the right were triphasic on the left biphasic to triphasic. This does not suggest significant arterial disease to make healing wounds at this location difficult. 12/29; patient presents for follow-up. She has been using Medihoney and Hydrofera Blue to the wound bed. She has been using Tubigrip. 1/5; patient presents for follow-up. She is been using Medihoney and Hydrofera Blue to the wound bed along with Tubigrip. The Tubigrip was doubled at last clinic visit. There is been improvement in wound healing. 1/12; patient presents for follow-up. She has been using Medihoney and Hydrofera Blue to the wound bed along with Tubigrip. There continues to be improvement in wound healing. 1/19; patient presents for follow-up. She has been using Medihoney and Hydrofera Blue to the wound bed along with her juxta light compression daily. There is been improvement in wound measurements. Patient has no issues or complaints today. 1/25; patient presents for follow-up. She has been using blast X and collagen to the wound bed under juxta light compression daily. She has no  issues or complaints today. There is been improvement in wound healing. Patient History Information obtained from Patient. Family History Cancer - Maternal Grandparents, Diabetes - Siblings, Heart Disease - Maternal Grandparents,Paternal Grandparents,Mother,Father,Siblings, Hypertension - Mother,Father,Siblings, Stroke - Paternal Grandparents, No family history of  Hereditary Spherocytosis, Kidney Disease, Lung Disease, Seizures, Thyroid Problems, Tuberculosis. Social History Never smoker, Marital Status - Married, Alcohol Use - Moderate, Drug Use - No History, Caffeine Use - Moderate. Medical History Eyes Denies history of Cataracts, Optic Neuritis Ear/Nose/Mouth/Throat Denies history of Chronic sinus problems/congestion Hematologic/Lymphatic Denies history of Anemia, Hemophilia, Human Immunodeficiency Virus, Lymphedema, Sickle Cell Disease Respiratory Patient has history of Asthma, Sleep Apnea Denies history of Aspiration, Chronic Obstructive Pulmonary Disease (COPD), Pneumothorax, Tuberculosis Cardiovascular Patient has history of Hypertension Denies history of Angina, Arrhythmia, Congestive Heart Failure, Coronary Artery Disease, Deep Vein Thrombosis, Hypotension, Myocardial Infarction, Peripheral Arterial Disease, Peripheral Venous Disease, Phlebitis, Vasculitis Gastrointestinal Patient has history of Colitis Denies history of Cirrhosis , Crohnoos, Hepatitis A, Hepatitis B, Hepatitis C Endocrine Denies history of Type I Diabetes, Type II Diabetes Immunological JONISE, WEIGHTMAN ([id number]) [id number] Page 7 of 11 Denies history of Lupus Erythematosus, Raynaudoos, Scleroderma Integumentary (Skin) Denies history of History of Burn Musculoskeletal Patient has history of Rheumatoid Arthritis, Osteoarthritis Denies history of Gout, Osteomyelitis Neurologic Denies history of Dementia, Neuropathy, Quadriplegia, Paraplegia, Seizure  Disorder Oncologic Denies history of Received Chemotherapy, Received Radiation Psychiatric Denies history of Anorexia/bulimia, Confinement Anxiety Hospitalization/Surgery History - surgery toe. - right hip replacement 12/2018. - heart cath 12/17/2021. Medical A Surgical History Notes nd Respiratory 4L [place] 02 pulmonary fibrosis Objective Constitutional respirations regular, non-labored and within target range for patient.. Vitals Time Taken: 1:00 PM, Height: 61 in, Weight: 186 lbs, BMI: 35.1, Temperature: 99.5 F, Pulse: 120 bpm, Respiratory Rate: 22 breaths/min, Blood Pressure: 157/74 mmHg. Cardiovascular 2+ dorsalis pedis/posterior tibialis pulses. Psychiatric pleasant and cooperative. General Notes: T the lateral aspect of the left lower extremity there are 2 open wounds. The more distal wound has slough. Good edema control. Obvious o deformity to the foot from previous surgeries. No signs of surrounding soft tissue infection. Integumentary (Hair, Skin) Wound #4 status is Open. Original cause of wound was Gradually Appeared. The date acquired was: 04/19/2022. The wound has been in treatment 9 weeks. The wound is located on the Left,Proximal,Anterior Lower Leg. The wound measures 1.4cm length x 0.7cm width x 0.2cm depth; 0.77cm^2 area and 0.154cm^3 volume. There is Fat Layer (Subcutaneous Tissue) exposed. There is no tunneling or undermining noted. There is a medium amount of serosanguineous drainage noted. The wound margin is distinct with the outline attached to the wound base. There is large (67-100%) pink, pale granulation within the wound bed. There is a small (1-33%) amount of necrotic tissue within the wound bed including Adherent Slough. The periwound skin appearance did not exhibit: Callus, Crepitus, Excoriation, Induration, Rash, Scarring, Dry/Scaly, Maceration, Atrophie Blanche, Cyanosis, Ecchymosis, Hemosiderin Staining, Mottled, Pallor, Rubor, Erythema. Periwound temperature  was noted as No Abnormality. The periwound has tenderness on palpation. Wound #5 status is Open. Original cause of wound was Gradually Appeared. The date acquired was: 04/19/2022. The wound has been in treatment 9 weeks. The wound is located on the [facility] Lower Leg. The wound measures 4.9cm length x 3cm width x 0.2cm depth; 11.545cm^2 area and 2.309cm^3 volume. There is tendon and Fat Layer (Subcutaneous Tissue) exposed. There is no tunneling or undermining noted. There is a medium amount of serosanguineous drainage noted. The wound margin is distinct with the outline attached to the wound base. There is large (67-100%) pink, pale granulation within the wound bed. There is a small (1-33%) amount of necrotic tissue within the wound bed including Adherent Slough. The periwound skin appearance did not exhibit: Callus, Crepitus, Excoriation, Induration, Rash, Scarring,  Dry/Scaly, Maceration, Atrophie Blanche, Cyanosis, Ecchymosis, Hemosiderin Staining, Mottled, Pallor, Rubor, Erythema. Periwound temperature was noted as No Abnormality. The periwound has tenderness on palpation. 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 I debrided slough. I recommended continuing the course with blast X and collagen. Continue juxta light compression daily. Follow-up in 1 week. Procedures DORCAS, MELITO ([id number]) [id number] Page 8 of 11 Wound #5 Pre-procedure diagnosis of Wound #5 is a Cellulitis located on the Left,Distal,Anterior Lower Leg . There was a Selective/Open Wound Non-Viable Tissue Debridement with a total area of 14.7 sq cm performed by  Hailey Shan, DO. With the following instrument(s): Curette to remove Non-Viable tissue/material. Material removed includes [facility] after achieving pain control using Lidocaine 5% topical ointment. No specimens were taken. A time out was conducted at 13:21, prior to the start of the procedure. A Minimum amount of bleeding was controlled with Pressure. The procedure was tolerated well with a pain level of 0 throughout and a pain level of 0 following the procedure. Post Debridement Measurements: 4.9cm length x 3cm width x 0.2cm depth; 2.309cm^3 volume. Character of Wound/Ulcer Post Debridement is improved. Post procedure Diagnosis Wound #5: Same as Pre-Procedure General Notes: Scribed for Dr Heber [place] by Sharyn Creamer, RN. Plan Follow-up Appointments: Return Appointment in 1 week. - Dr. Heber [place] 08/18/2022 130pm Return Appointment in 2 weeks. - Dr. Heber [place] Other: - [employer] will call you a copay for blastx and collagen. IF too expensive use only medihoney to leg wounds. Anesthetic: (In clinic) Topical Lidocaine 5% applied to wound bed Bathing/ Shower/ Hygiene: May shower with protection but do not get wound dressing(s) wet. Protect dressing(s) with water repellant cover (for example, large plastic bag) or a cast cover and may then take shower. Edema Control - Lymphedema / SCD / Other: Elevate legs to the level of the heart or above for 30 minutes daily and/or when sitting for 3-4 times a day throughout the day. Avoid standing for long periods of time. Exercise regularly Compression stocking or Garment 20-30 mm/Hg pressure to: - start wearing the juxtalite HD apply in the morning and remove at night left leg. Non Wound Condition: Protect area with: - buttock area with AandD ointment daily The following medication(s) was prescribed: lidocaine topical 5 % ointment ointment topical once daily for prior to debridement was prescribed at facility WOUND #4: - Lower Leg Wound Laterality: Left,  Anterior, Proximal Cleanser: Wound Cleanser (Generic) 1 x Per Day/30 Days Discharge Instructions: Cleanse the wound with wound cleanser prior to applying a clean dressing using gauze sponges, not tissue or cotton balls. Topical: blastx 1 x Per Day/30 Days Discharge Instructions: apply a layer to wound bed. Prim Dressing: Fibracol Plus Dressing, 4x4.38 in (collagen) 1 x Per Day/30 Days ary Discharge Instructions: Moisten collagen over the collagen. Secondary Dressing: ABD Pad, 8x10 (Generic) 1 x Per Day/30 Days Discharge Instructions: Apply over primary dressing as directed. Secured With: [employer], 4.5x3.1 (in/yd) (Generic) 1 x Per Day/30 Days Discharge Instructions: Secure with Kerlix as directed. Secured With: 106M Medipore H Soft Cloth Surgical T ape, 4 x 10 (in/yd) (Generic) 1 x Per Day/30 Days Discharge Instructions: Secure with tape as directed. Com pression Stockings: Circaid Juxta Lite Compression Wrap Compression Amount: 20-30 mmHg (left) Discharge Instructions: Apply  Circaid Juxta Lite Compression Wrap daily as instructed. Apply first thing in the morning, remove at night before bed. WOUND #5: - Lower Leg Wound Laterality: Left, Anterior, Distal Cleanser: Wound Cleanser (Generic) 1 x Per Day/30 Days Discharge Instructions: Cleanse the wound with wound cleanser prior to applying a clean dressing using gauze sponges, not tissue or cotton balls. Topical: blastx 1 x Per Day/30 Days Discharge Instructions: apply a layer to wound bed. Prim Dressing: Fibracol Plus Dressing, 4x4.38 in (collagen) 1 x Per Day/30 Days ary Discharge Instructions: Moisten collagen over the collagen. Secondary Dressing: ABD Pad, 8x10 (Generic) 1 x Per Day/30 Days Discharge Instructions: Apply over primary dressing as directed. Secured With: [employer], 4.5x3.1 (in/yd) (Generic) 1 x Per Day/30 Days Discharge Instructions: Secure with Kerlix as directed. Secured With: 39M Medipore H Soft Cloth  Surgical T ape, 4 x 10 (in/yd) (Generic) 1 x Per Day/30 Days Discharge Instructions: Secure with tape as directed. 1. Collagen and blast X 2. Juxta light compression daily 3. Follow-up in 1 week 4. In office sharp debridement Electronic Signature(s) Signed: 08/11/2022 2:31:01 PM By: Hailey Shan DO Entered By: [name] on 08/11/2022 13:36:36 [place], [name] ([id number]) [id number] Page 9 of 11 -------------------------------------------------------------------------------- HxROS Details Patient Name: Date of Service: Hailey Courser, DO [name] 08/11/2022 12:30 PM Medical Record Number: [id number] Patient Account Number: [id number] Date of Birth/Sex: Treating RN: 1945-12-21 (77 y.o. F) Primary Care Provider: Lorene Dy Other Clinician: Referring Provider: Treating Provider/Extender: [name] in Treatment: 9 Information Obtained From Patient Eyes Medical History: Negative for: Cataracts; Optic Neuritis Ear/Nose/Mouth/Throat Medical History: Negative for: Chronic sinus problems/congestion Hematologic/Lymphatic Medical History: Negative for: Anemia; Hemophilia; Human Immunodeficiency Virus; Lymphedema; Sickle Cell Disease Respiratory Medical History: Positive for: Asthma; Sleep Apnea Negative for: Aspiration; Chronic Obstructive Pulmonary Disease (COPD); Pneumothorax; Tuberculosis Past Medical History Notes: 4L [place] 02 pulmonary fibrosis Cardiovascular Medical History: Positive for: Hypertension Negative for: Angina; Arrhythmia; Congestive Heart Failure; Coronary Artery Disease; Deep Vein Thrombosis; Hypotension; Myocardial Infarction; Peripheral Arterial Disease; Peripheral Venous Disease; Phlebitis; Vasculitis Gastrointestinal Medical History: Positive for: Colitis Negative for: Cirrhosis ; Crohns; Hepatitis A; Hepatitis B; Hepatitis C Endocrine Medical History: Negative for: Type I Diabetes; Type II  Diabetes Immunological Medical History: Negative for: Lupus Erythematosus; Raynauds; Scleroderma Integumentary (Skin) Medical History: Negative for: History of Burn Musculoskeletal Medical History: Positive for: Rheumatoid Arthritis; Osteoarthritis Negative for: Gout; Osteomyelitis Neurologic Medical History: Negative for: Dementia; Neuropathy; Quadriplegia; Paraplegia; Seizure Disorder Oncologic Medical History: Negative for: Received Chemotherapy; Received Radiation Hailey, [name] ([id number]) [id number] Page 10 of 11 Psychiatric Medical History: Negative for: Anorexia/bulimia; Confinement Anxiety Immunizations Pneumococcal Vaccine: Received Pneumococcal Vaccination: Yes Received Pneumococcal Vaccination On or After 60th Birthday: Yes Implantable Devices No devices added Hospitalization / Surgery History Type of Hospitalization/Surgery surgery toe right hip replacement 12/2018 heart cath 12/17/2021 Family and Social History Cancer: Yes - Maternal Grandparents; Diabetes: Yes - Siblings; Heart Disease: Yes - Maternal Grandparents,Paternal Grandparents,Mother,Father,Siblings; Hereditary Spherocytosis: No; Hypertension: Yes - Mother,Father,Siblings; Kidney Disease: No; Lung Disease: No; Seizures: No; Stroke: Yes - Paternal Grandparents; Thyroid Problems: No; Tuberculosis: No; Never smoker; Marital Status - Married; Alcohol Use: Moderate; Drug Use: No History; Caffeine Use: Moderate; Financial Concerns: No; Food, Clothing or Shelter Needs: No; Support System Lacking: No; Transportation Concerns: No Electronic Signature(s) Signed: 08/11/2022 2:31:01 PM By: Hailey Shan DO Entered By: [name] on 08/11/2022 13:34:33 -------------------------------------------------------------------------------- SuperBill Details Patient Name: Date of Service: [name], DO [name] 08/11/2022 Medical Record Number:  [id number] Patient Account Number:  [id number] Date of Birth/Sex: Treating RN: 29-Dec-1945 (77 y.o. F) Primary Care Provider: Lorene Dy Other Clinician: Referring Provider: Treating Provider/Extender: [name] in Treatment: 9 Diagnosis Coding ICD-10 Codes Code Description [phone] Non-pressure chronic ulcer of other part of left lower leg with other specified severity I87.312 Chronic venous hypertension (idiopathic) with ulcer of left lower extremity J84.9 Interstitial pulmonary disease, unspecified M06.9 Rheumatoid arthritis, unspecified Facility Procedures : CPT4 Code: [id number] Description: [phone] - DEBRIDE WOUND 1ST 20 SQ CM OR < ICD-10 Diagnosis Description L97.828 Non-pressure chronic ulcer of other part of left lower leg with other specified se Modifier: verity Quantity: 1 Physician Procedures : CPT4 Code Description Modifier [id number] - WC PHYS DEBR WO ANESTH 20 SQ CM ICD-10 Diagnosis Description L97.828 Non-pressure chronic ulcer of other part of left lower leg with other specified severity VERNICA, WACHTEL ([id number])  [id number] Quantity: 1 Page 11 of 11 Electronic Signature(s) Signed: 08/11/2022 2:31:01 PM By: Hailey Shan DO Entered By: [name] on 08/11/2022 13:36:46

## 2022-08-12 MED ORDER — PIRFENIDONE 801 MG PO TABS: 801.0000 mg | ORAL_TABLET | Freq: Three times a day (TID) | ORAL | 1 refills | Status: DC

## 2022-08-12 NOTE — Telephone Encounter (Signed)
Refill sent for ESBRIET to [facility] Optician, dispensing) for Esbriet: [phone]  Dose: 801 mg three times daily  Last OV: 08/11/2022 Provider: [name]  LFTs on 08/11/2022 stable  Patient notified today via phone. She has already scheduled shipment  Knox Saliva, PharmD, MPH, BCPS Clinical Pharmacist (Rheumatology and Pulmonology)

## 2022-08-18 ENCOUNTER — Encounter (HOSPITAL_BASED_OUTPATIENT_CLINIC_OR_DEPARTMENT_OTHER): Payer: Medicare Other | Attending: Internal Medicine | Admitting: Internal Medicine

## 2022-08-18 DIAGNOSIS — J849 Interstitial pulmonary disease, unspecified: Secondary | ICD-10-CM | POA: Diagnosis not present

## 2022-08-18 DIAGNOSIS — Z8249 Family history of ischemic heart disease and other diseases of the circulatory system: Secondary | ICD-10-CM | POA: Insufficient documentation

## 2022-08-18 DIAGNOSIS — I87312 Chronic venous hypertension (idiopathic) with ulcer of left lower extremity: Secondary | ICD-10-CM | POA: Diagnosis not present

## 2022-08-18 DIAGNOSIS — I110 Hypertensive heart disease with heart failure: Secondary | ICD-10-CM | POA: Insufficient documentation

## 2022-08-18 DIAGNOSIS — G4733 Obstructive sleep apnea (adult) (pediatric): Secondary | ICD-10-CM | POA: Insufficient documentation

## 2022-08-18 DIAGNOSIS — Z9981 Dependence on supplemental oxygen: Secondary | ICD-10-CM | POA: Diagnosis not present

## 2022-08-18 DIAGNOSIS — I5032 Chronic diastolic (congestive) heart failure: Secondary | ICD-10-CM | POA: Insufficient documentation

## 2022-08-18 DIAGNOSIS — L97828 Non-pressure chronic ulcer of other part of left lower leg with other specified severity: Secondary | ICD-10-CM

## 2022-08-18 DIAGNOSIS — M069 Rheumatoid arthritis, unspecified: Secondary | ICD-10-CM | POA: Diagnosis not present

## 2022-08-19 NOTE — Progress Notes (Addendum)
RAJNI, STREETMAN ([id number]) [id number] Page 1 of 10 Visit Report for 08/18/2022 Chief Complaint Document Details Patient Name: Date of Service: Hailey Courser, DO [name] 08/18/2022 1:30 PM Medical Record Number: [id number] Patient Account Number: [id number] Date of Birth/Sex: Treating RN: Feb 22, 1946 (77 y.o. F) Primary Care Provider: Lorene Dy Other Clinician: Referring Provider: Treating Provider/Extender: [name] in Treatment: 10 Information Obtained from: Patient Chief Complaint 06/03/2022; Left lower extremity wounds Electronic Signature(s) Signed: 08/18/2022 4:03:41 PM By: Kalman Shan DO Entered By: Kalman Shan on 08/18/2022 14:13:15 -------------------------------------------------------------------------------- Debridement Details Patient Name: Date of Service: Hailey Courser, DO [name] 08/18/2022 1:30 PM Medical Record Number: [id number] Patient Account Number: [id number] Date of Birth/Sex: Treating RN: 09-19-1945 (77 y.o. [name], [name] Primary Care Provider: Lorene Dy Other Clinician: Referring Provider: Treating Provider/Extender: [name] in Treatment: 10 Debridement Performed for Assessment: Wound #5 Left,Distal,Anterior Lower Leg Performed By: Physician Kalman Shan, DO Debridement Type: Debridement Level of Consciousness (Pre-procedure): Awake and Alert Pre-procedure Verification/Time Out Yes - 14:00 Taken: Start Time: 14:01 Pain Control: Lidocaine 5% topical ointment T Area Debrided (L x W): otal 5 (cm) x 3.1 (cm) = 15.5 (cm) Tissue and other material debrided: Viable, Non-Viable, Slough, Subcutaneous, Skin: Dermis , Skin: Epidermis, Slough Level: Skin/Subcutaneous Tissue Debridement Description: Excisional Instrument: Curette Bleeding: Moderate Hemostasis Achieved: Pressure End Time: 14:06 Procedural Pain: 0 Post Procedural Pain: 2 Response to Treatment:  Procedure was tolerated well Level of Consciousness (Post- Awake and Alert procedure): Post Debridement Measurements of Total Wound Length: (cm) 5 Width: (cm) 3.1 Depth: (cm) 0.2 Volume: (cm) 2.435 Character of Wound/Ulcer Post Debridement: Requires Further Debridement Post Procedure Diagnosis [name], [name] ([id number]) [id number] Page 2 of 10 Same as Pre-procedure Electronic Signature(s) Signed: 08/18/2022 4:03:41 PM By: Kalman Shan DO Signed: 08/18/2022 5:30:52 PM By: Deon Pilling RN, BSN Entered By: Deon Pilling on 08/18/2022 14:06:21 -------------------------------------------------------------------------------- HPI Details Patient Name: Date of Service: [name], DO [name] 08/18/2022 1:30 PM Medical Record Number: [id number] Patient Account Number: [id number] Date of Birth/Sex: Treating RN: May 06, 1946 (77 y.o. F) Primary Care Provider: Lorene Dy Other Clinician: Referring Provider: Treating Provider/Extender: [name] in Treatment: 10 History of Present Illness HPI Description: Patient presents today for initial evaluation and our clinic as a referral from the [facility] health system Department of orthopedics for evaluation and treatment of the wound to his his of the left foot at the base of the great toe. Currently the good news is the patient really does not have any significant pain which is excellent. She has been seen by Dr. Linus Salmons at [facility]: infectious disease clinic that is the regional Center for infectious disease. Subsequently the patient was cultured and positive for methicillin sensitive staph aureus. She has been on antibiotics including Cipro prior to surgery as will several days following surgery. She then had clindamycin which was changed to doxycycline. She took this for 10 days. Subsequently the pain had improved and there was no longer any possible draining from the wound according to  the notes. The patient sedentary rate as well as C-reactive protein have returned to normal ranges. Currently per Dr. Henreitta Leber assessment there was one dehiscence with no sign of osteomyelitis. He therefore discontinue the antibiotics with the completion of the last three days Of doxycycline. Other than that he just set her for a follow-up as needed. The patient does not have diabetes and is not a current smoker. At  this time her treatment that was recommended by the surgeon was applying a Hydrocolloid dressing. Based on what I'm seeing at this point the patient actually has some Slough noted over the surface of the wound there really does not appear to be evidence of infection though I do think she does require some sharp debridement to clear away the slough and help with appropriate wound healing. She does have areas of granulation buds noted. She may be a candidate for a skin substitute as well. We will see how things do over the next period of time until follow-up. No fevers, chills, nausea, or vomiting noted at this time. 06/13/18 on evaluation today patient actually appears to be doing better in regard to her toe ulcer. She's been using the Prisma on this region and that seems to have done extremely well for her. Fortunately there does not appear to be evidence of infection at this time which is good news. She did see her surgeon they were extremely pleased with the overall appearance of her wound. 06/27/18 on evaluation today patient actually appears to be doing very well in regard to her foot ulcer. She has been tolerating the dressing changes without complication which is great news. She did see her podiatrist they felt like everything is looking very nice as well and will please. 07/05/2018 surgical wound on the left foot. She appears to be doing well. Still surface debrided to remove however in general post debridement the wound bed looks quite healthy it is come down significantly in terms  of dimensions using silver collagen. The patient describes pain when her foot is elevated either in bed at night [sometimes keeps her awake] or when is propped up on a foot rest. She basically takes analgesics for this. She does not really describe claudication with activity however her activity is very limited by interstitial lung disease. Her ABIs initially in this clinic were noncompressible. She is not a diabetic 07/20/2018; surgical wound on the left foot. Wound actually looks quite a bit better than I remember seeing this. She is still describing pain with her leg elevated at night that she does not get when the wound is supine. She does not really describe claudication but she is very limited by her pulmonary status. We have been using silver collagen. 1/17; the patient has been followed up by orthopedics and discharge. Her arterial studies were actually quite good and should not be contributing to any pain. Slight reduction in ABIs but otherwise normal. Her wound is closed. She saw Dr. Linus Salmons of infectious disease surrounding the surgery. By review of our notes she was not felt to have osteomyelitis. 06/03/2022 Ms. Amary Crognale is a 77 year old female with a past medical history of interstitial lung disease on chronic oxygen via nasal cannula, rheumatoid arthritis, chronic diastolic heart failure and OSA that presents to the clinic for a 1 to 39-monthhistory of nonhealing ulcer to the left lower extremity. On 04/20/2022 she was admitted to the hospital for left lower extremity cellulitis. She required 8 days of IV vancomycin and Unasyn followed by a 14-day course of Augmentin and doxycycline. She has been using Silvadene cream to the area. She does not use compression therapy but does own compression stockings. She currently denies signs of infection. 11/27; patient presents for follow-up she is using Medihoney and Hydrofera Blue to the wound beds. She has been using her Tubigrip. She has no  issues or complaints today. 12/4; patient presents for follow-up. She continues to use Medihoney  and Hydrofera Blue to the wound beds. Several attempts have been made to schedule ABIs with TBI's. We gave patient the number today to call to have these scheduled. She has been using Tubigrip. She has no issues or complaints today. 12/18; she continues to use Medihoney and Hydrofera Blue in general the surface of the wound is looking somewhat better this week and measurements are slightly smaller. She did have her arterial studies done these were noncompressible bilaterally however TBI on the right of 0.69 on the left 0.62. Waveforms on the right were triphasic on the left biphasic to triphasic. This does not suggest significant arterial disease to make healing wounds at this location difficult. 12/29; patient presents for follow-up. She has been using Medihoney and Hydrofera Blue to the wound bed. She has been using Tubigrip. 1/5; patient presents for follow-up. She is been using Medihoney and Hydrofera Blue to the wound bed along with Tubigrip. The Tubigrip was doubled at last clinic visit. There is been improvement in wound healing. 1/12; patient presents for follow-up. She has been using Medihoney and Hydrofera Blue to the wound bed along with Tubigrip. There continues to be improvement in wound healing. [name], [name] ([id number]) [id number] Page 3 of 10 1/19; patient presents for follow-up. She has been using Medihoney and Hydrofera Blue to the wound bed along with her juxta light compression daily. There is been improvement in wound measurements. Patient has no issues or complaints today. 1/25; patient presents for follow-up. She has been using blast X and collagen to the wound bed under juxta light compression daily. She has no issues or complaints today. There is been improvement in wound healing. 2/1; patient presents for follow-up. She has been using blast X and  collagen to the wound bed under juxta light compression daily. She has no issues or complaints today. Electronic Signature(s) Signed: 08/18/2022 4:03:41 PM By: Kalman Shan DO Entered By: Kalman Shan on 08/18/2022 14:13:43 -------------------------------------------------------------------------------- Physical Exam Details Patient Name: Date of Service: Hailey Courser, DO [name] 08/18/2022 1:30 PM Medical Record Number: [id number] Patient Account Number: [id number] Date of Birth/Sex: Treating RN: 08-Apr-1946 (77 y.o. F) Primary Care Provider: Lorene Dy Other Clinician: Referring Provider: Treating Provider/Extender: [name] in Treatment: 10 Constitutional respirations regular, non-labored and within target range for patient.. Cardiovascular 2+ dorsalis pedis/posterior tibialis pulses. Psychiatric pleasant and cooperative. Notes T the lateral aspect of the left lower extremity there are 2 open wounds. The more distal wound has slough. Good edema control. Obvious deformity to the o foot from previous surgeries. No signs of surrounding soft tissue infection. Electronic Signature(s) Signed: 08/18/2022 4:03:41 PM By: Kalman Shan DO Entered By: Kalman Shan on 08/18/2022 14:14:03 -------------------------------------------------------------------------------- Physician Orders Details Patient Name: Date of Service: [name], DO [name] 08/18/2022 1:30 PM Medical Record Number: [id number] Patient Account Number: [id number] Date of Birth/Sex: Treating RN: [birth year] (77 y.o. [name] Primary Care Provider: Lorene Dy Other Clinician: Referring Provider: Treating Provider/Extender: [name] in Treatment: 10 Verbal / Phone Orders: No Diagnosis Coding ICD-10 Coding Code Description [phone] Non-pressure chronic ulcer of other part of left lower leg with other specified severity I87.312 Chronic venous  hypertension (idiopathic) with ulcer of left lower extremity J84.9 Interstitial pulmonary disease, unspecified M06.9 Rheumatoid arthritis, unspecified COLLETTE, CHENEY ([id number]) [id number] Page 4 of 10 Follow-up Appointments ppointment in 1 week. - Dr. Heber [place] 08/26/2022 1130 Return A ppointment in 2 weeks. - Dr. Heber [place] 1200 09/01/2022 Return  A Other: - [employer] blastx and collagen. . Anesthetic (In clinic) Topical Lidocaine 5% applied to wound bed Bathing/ Shower/ Hygiene May shower with protection but do not get wound dressing(s) wet. Protect dressing(s) with water repellant cover (for example, large plastic bag) or a cast cover and may then take shower. Edema Control - Lymphedema / SCD / Other Elevate legs to the level of the heart or above for 30 minutes daily and/or when sitting for 3-4 times a day throughout the day. Avoid standing for long periods of time. Exercise regularly Compression stocking or Garment 20-30 mm/Hg pressure to: - start wearing the juxtalite HD apply in the morning and remove at night left leg. Non Wound Condition Protect area with: - buttock area with AandD ointment daily Wound Treatment Wound #4 - Lower Leg Wound Laterality: Left, Anterior, Proximal Cleanser: Wound Cleanser (Generic) 1 x Per Day/30 Days Discharge Instructions: Cleanse the wound with wound cleanser prior to applying a clean dressing using gauze sponges, not tissue or cotton balls. Peri-Wound Care: Skin Prep (DME) (Generic) 1 x Per Day/30 Days Discharge Instructions: Use skin prep as directed Topical: blastx 1 x Per Day/30 Days Discharge Instructions: apply a layer to wound bed. Prim Dressing: Hydrofera Blue Ready Transfer Foam, 4x5 (in/in) 1 x Per Day/30 Days ary Discharge Instructions: Apply to wound bed as instructed Secondary Dressing: Zetuvit Plus Silicone Border Dressing 4x4 (in/in) (DME) (Generic) 1 x Per Day/30 Days Discharge Instructions: Apply  silicone border over primary dressing as directed. Compression Stockings: Circaid Juxta Lite Compression Wrap Left Leg Compression Amount: 20-30 mmHG Discharge Instructions: Apply Circaid Juxta Lite Compression Wrap daily as instructed. Apply first thing in the morning, remove at night before bed. Wound #5 - Lower Leg Wound Laterality: Left, Anterior, Distal Cleanser: Wound Cleanser (Generic) 1 x Per Day/30 Days Discharge Instructions: Cleanse the wound with wound cleanser prior to applying a clean dressing using gauze sponges, not tissue or cotton balls. Topical: blastx 1 x Per Day/30 Days Discharge Instructions: apply a layer to wound bed. Prim Dressing: Hydrofera Blue Ready Transfer Foam, 4x5 (in/in) 1 x Per Day/30 Days ary Discharge Instructions: Apply to wound bed as instructed Secondary Dressing: Zetuvit Plus Silicone Border Dressing 5x5 (in/in) (DME) (Generic) 1 x Per Day/30 Days Discharge Instructions: Apply silicone border over primary dressing as directed. Electronic Signature(s) Signed: 08/18/2022 4:03:41 PM By: Kalman Shan DO Signed: 08/18/2022 5:30:52 PM By: Deon Pilling RN, BSN Entered By: Deon Pilling on 08/18/2022 14:15:43 -------------------------------------------------------------------------------- Problem List Details Patient Name: Date of Service: [name], DO [name] 08/18/2022 1:30 PM Medical Record Number: [id number] Patient Account Number: [id number] Date of Birth/Sex: Treating RN: [birth year] (77 y.o. [name] Primary Care Provider: Lorene Dy Other Clinician: [name] ([id number]) [id number] Page 5 of 10 Referring Provider: Treating Provider/Extender: [name] in Treatment: 10 Active Problems ICD-10 Encounter Code Description Active Date MDM Diagnosis L97.828 Non-pressure chronic ulcer of other part of left lower leg with other specified 06/03/2022 No Yes severity I87.312  Chronic venous hypertension (idiopathic) with ulcer of left lower extremity 06/03/2022 No Yes J84.9 Interstitial pulmonary disease, unspecified 06/03/2022 No Yes M06.9 Rheumatoid arthritis, unspecified 06/03/2022 No Yes Inactive Problems Resolved Problems Electronic Signature(s) Signed: 08/18/2022 4:03:41 PM By: Kalman Shan DO Entered By: Kalman Shan on 08/18/2022 14:13:05 -------------------------------------------------------------------------------- Progress Note Details Patient Name: Date of Service: [place]. 08/18/2022 1:30 PM Medical Record Number: [id number] Patient Account Number: [id number] Date of Birth/Sex: Treating  RN: 03-Dec-1945 (77 y.o. F) Primary Care Provider: Lorene Dy Other Clinician: Referring Provider: Treating Provider/Extender: [name] in Treatment: 10 Subjective Chief Complaint Information obtained from Patient 06/03/2022; Left lower extremity wounds History of Present Illness (HPI) Patient presents today for initial evaluation and our clinic as a referral from the [facility] health system Department of orthopedics for evaluation and treatment of the wound to his his of the left foot at the base of the great toe. Currently the good news is the patient really does not have any significant pain which is excellent. She has been seen by Dr. Linus Salmons at [facility]: infectious disease clinic that is the regional Center for infectious disease. Subsequently the patient was cultured and positive for methicillin sensitive staph aureus. She has been on antibiotics including Cipro prior to surgery as will several days following surgery. She then had clindamycin which was changed to doxycycline. She took this for 10 days. Subsequently the pain had improved and there was no longer any possible draining from the wound according to the notes. The patient sedentary rate as well as C-reactive protein have returned to normal  ranges. Currently per Dr. Henreitta Leber assessment there was one dehiscence with no sign of osteomyelitis. He therefore discontinue the antibiotics with the completion of the last three days Of doxycycline. Other than that he just set her for a follow-up as needed. The patient does not have diabetes and is not a current smoker. At this time her treatment that was recommended by the surgeon was applying a Hydrocolloid dressing. Based on what I'm seeing at this point the patient actually has some Slough noted over the surface of the wound there really does not appear to be evidence of infection though I do think she does require some sharp debridement to clear away the slough and help with appropriate wound healing. She does have areas of granulation buds noted. She may be a candidate for a skin substitute as well. We will see how things do over the next period of time until follow-up. No fevers, chills, nausea, or vomiting noted at this time. 06/13/18 on evaluation today patient actually appears to be doing better in regard to her toe ulcer. She's been using the Prisma on this region and that seems to have done extremely well for her. Fortunately there does not appear to be evidence of infection at this time which is good news. She did see her surgeon they were extremely pleased with the overall appearance of her wound. 06/27/18 on evaluation today patient actually appears to be doing very well in regard to her foot ulcer. She has been tolerating the dressing changes without AKIYA, KADRMAS ([id number]) [id number] Page 6 of 10 complication which is great news. She did see her podiatrist they felt like everything is looking very nice as well and will please. 07/05/2018 surgical wound on the left foot. She appears to be doing well. Still surface debrided to remove however in general post debridement the wound bed looks quite healthy it is come down significantly in terms of  dimensions using silver collagen. The patient describes pain when her foot is elevated either in bed at night [sometimes keeps her awake] or when is propped up on a foot rest. She basically takes analgesics for this. She does not really describe claudication with activity however her activity is very limited by interstitial lung disease. Her ABIs initially in this clinic were noncompressible. She is not a diabetic 07/20/2018; surgical wound on the left  foot. Wound actually looks quite a bit better than I remember seeing this. She is still describing pain with her leg elevated at night that she does not get when the wound is supine. She does not really describe claudication but she is very limited by her pulmonary status. We have been using silver collagen. 1/17; the patient has been followed up by orthopedics and discharge. Her arterial studies were actually quite good and should not be contributing to any pain. Slight reduction in ABIs but otherwise normal. Her wound is closed. She saw Dr. Linus Salmons of infectious disease surrounding the surgery. By review of our notes she was not felt to have osteomyelitis. 06/03/2022 Ms. Mekala Super is a 77 year old female with a past medical history of interstitial lung disease on chronic oxygen via nasal cannula, rheumatoid arthritis, chronic diastolic heart failure and OSA that presents to the clinic for a 1 to 19-monthhistory of nonhealing ulcer to the left lower extremity. On 04/20/2022 she was admitted to the hospital for left lower extremity cellulitis. She required 8 days of IV vancomycin and Unasyn followed by a 14-day course of Augmentin and doxycycline. She has been using Silvadene cream to the area. She does not use compression therapy but does own compression stockings. She currently denies signs of infection. 11/27; patient presents for follow-up she is using Medihoney and Hydrofera Blue to the wound beds. She has been using her Tubigrip. She has no issues  or complaints today. 12/4; patient presents for follow-up. She continues to use Medihoney and Hydrofera Blue to the wound beds. Several attempts have been made to schedule ABIs with TBI's. We gave patient the number today to call to have these scheduled. She has been using Tubigrip. She has no issues or complaints today. 12/18; she continues to use Medihoney and Hydrofera Blue in general the surface of the wound is looking somewhat better this week and measurements are slightly smaller. She did have her arterial studies done these were noncompressible bilaterally however TBI on the right of 0.69 on the left 0.62. Waveforms on the right were triphasic on the left biphasic to triphasic. This does not suggest significant arterial disease to make healing wounds at this location difficult. 12/29; patient presents for follow-up. She has been using Medihoney and Hydrofera Blue to the wound bed. She has been using Tubigrip. 1/5; patient presents for follow-up. She is been using Medihoney and Hydrofera Blue to the wound bed along with Tubigrip. The Tubigrip was doubled at last clinic visit. There is been improvement in wound healing. 1/12; patient presents for follow-up. She has been using Medihoney and Hydrofera Blue to the wound bed along with Tubigrip. There continues to be improvement in wound healing. 1/19; patient presents for follow-up. She has been using Medihoney and Hydrofera Blue to the wound bed along with her juxta light compression daily. There is been improvement in wound measurements. Patient has no issues or complaints today. 1/25; patient presents for follow-up. She has been using blast X and collagen to the wound bed under juxta light compression daily. She has no issues or complaints today. There is been improvement in wound healing. 2/1; patient presents for follow-up. She has been using blast X and collagen to the wound bed under juxta light compression daily. She has no issues  or complaints today. Patient History Information obtained from Patient. Family History Cancer - Maternal Grandparents, Diabetes - Siblings, Heart Disease - Maternal Grandparents,Paternal Grandparents,Mother,Father,Siblings, Hypertension - Mother,Father,Siblings, Stroke - Paternal Grandparents, No family history of Hereditary  Spherocytosis, Kidney Disease, Lung Disease, Seizures, Thyroid Problems, Tuberculosis. Social History Never smoker, Marital Status - Married, Alcohol Use - Moderate, Drug Use - No History, Caffeine Use - Moderate. Medical History Eyes Denies history of Cataracts, Optic Neuritis Ear/Nose/Mouth/Throat Denies history of Chronic sinus problems/congestion Hematologic/Lymphatic Denies history of Anemia, Hemophilia, Human Immunodeficiency Virus, Lymphedema, Sickle Cell Disease Respiratory Patient has history of Asthma, Sleep Apnea Denies history of Aspiration, Chronic Obstructive Pulmonary Disease (COPD), Pneumothorax, Tuberculosis Cardiovascular Patient has history of Hypertension Denies history of Angina, Arrhythmia, Congestive Heart Failure, Coronary Artery Disease, Deep Vein Thrombosis, Hypotension, Myocardial Infarction, Peripheral Arterial Disease, Peripheral Venous Disease, Phlebitis, Vasculitis Gastrointestinal Patient has history of Colitis Denies history of Cirrhosis , Crohnoos, Hepatitis A, Hepatitis B, Hepatitis C Endocrine Denies history of Type I Diabetes, Type II Diabetes Immunological Denies history of Lupus Erythematosus, Raynaudoos, Scleroderma Integumentary (Skin) Denies history of History of Burn Musculoskeletal Patient has history of Rheumatoid Arthritis, Osteoarthritis Denies history of Gout, Osteomyelitis Neurologic Denies history of Dementia, Neuropathy, Quadriplegia, Paraplegia, Seizure Disorder Oncologic Denies history of Received Chemotherapy, Received Radiation Psychiatric Denies history of Hollace Hayward [address] TAMERRA, [name] ([id number]) [id number] Page 7 of 10 Hospitalization/Surgery History - surgery toe. - right hip replacement 12/2018. - heart cath 12/17/2021. Medical A Surgical History Notes nd Respiratory 4L [place] 02 pulmonary fibrosis Objective Constitutional respirations regular, non-labored and within target range for patient.. Vitals Time Taken: 1:37 PM, Height: 61 in, Weight: 186 lbs, BMI: 35.1, Temperature: 98.1 F, Pulse: 73 bpm, Respiratory Rate: 20 breaths/min, Blood Pressure: 147/74 mmHg. Cardiovascular 2+ dorsalis pedis/posterior tibialis pulses. Psychiatric pleasant and cooperative. General Notes: T the lateral aspect of the left lower extremity there are 2 open wounds. The more distal wound has slough. Good edema control. Obvious o deformity to the foot from previous surgeries. No signs of surrounding soft tissue infection. Integumentary (Hair, Skin) Wound #4 status is Open. Original cause of wound was Gradually Appeared. The date acquired was: 04/19/2022. The wound has been in treatment 10 weeks. The wound is located on the Left,Proximal,Anterior Lower Leg. The wound measures 1.1cm length x 0.6cm width x 0.2cm depth; 0.518cm^2 area and 0.104cm^3 volume. There is Fat Layer (Subcutaneous Tissue) exposed. There is no tunneling or undermining noted. There is a medium amount of serosanguineous drainage noted. The wound margin is distinct with the outline attached to the wound base. There is large (67-100%) pink, pale granulation within the wound bed. There is no necrotic tissue within the wound bed. The periwound skin appearance did not exhibit: Callus, Crepitus, Excoriation, Induration, Rash, Scarring, Dry/Scaly, Maceration, Atrophie Blanche, Cyanosis, Ecchymosis, Hemosiderin Staining, Mottled, Pallor, Rubor, Erythema. Periwound temperature was noted as No Abnormality. The periwound has tenderness on palpation. Wound #5 status is Open. Original  cause of wound was Gradually Appeared. The date acquired was: 04/19/2022. The wound has been in treatment 10 weeks. The wound is located on the [facility] Lower Leg. The wound measures 5cm length x 3.1cm width x 0.2cm depth; 12.174cm^2 area and 2.435cm^3 volume. There is tendon and Fat Layer (Subcutaneous Tissue) exposed. There is no tunneling or undermining noted. There is a medium amount of serosanguineous drainage noted. The wound margin is distinct with the outline attached to the wound base. There is large (67-100%) pink, pale granulation within the wound bed. There is a small (1-33%) amount of necrotic tissue within the wound bed. The periwound skin appearance did not exhibit: Callus, Crepitus, Excoriation, Induration, Rash, Scarring, Dry/Scaly, Maceration, Atrophie Blanche, Cyanosis, Ecchymosis, Hemosiderin Staining, Mottled, Pallor, Rubor,  Erythema. Periwound temperature was noted as No Abnormality. The periwound has tenderness on palpation. 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 I debrided nonviable tissue. I recommended switching the primary dressing from collagen to Hydrofera Blue to help with further debridement. Continue blast X. Continue compression wrap. Procedures Wound #5 Pre-procedure diagnosis of Wound #5 is a Cellulitis located on the Left,Distal,Anterior Lower Leg . There was a Excisional Skin/Subcutaneous Tissue Debridement with a total area of 15.5 sq cm performed by Kalman Shan, DO. With the following instrument(s): Curette to remove Viable and Non-Viable tissue/material. Material removed includes Subcutaneous Tissue, Slough, Skin: Dermis, and Skin: Epidermis after achieving pain control  using Lidocaine 5% topical ointment. A time out was conducted at 14:00, prior to the start of the procedure. A Moderate amount of bleeding was controlled with Pressure. The procedure was tolerated well with a pain level of 0 throughout and a pain level of 2 following the procedure. Post Debridement Measurements: 5cm length x 3.1cm width x 0.2cm depth; 2.435cm^3 volume. Character of Wound/Ulcer Post Debridement requires further debridement. Post procedure Diagnosis Wound #5: Same as Pre-Procedure KALLEY, THATCH ([id number]) [id number] Page 8 of 10 Plan Follow-up Appointments: Return Appointment in 1 week. - Dr. Heber [place] 08/26/2022 1130 Return Appointment in 2 weeks. - Dr. Heber [place] 1200 09/01/2022 Other: - [employer] blastx and collagen. . Anesthetic: (In clinic) Topical Lidocaine 5% applied to wound bed Bathing/ Shower/ Hygiene: May shower with protection but do not get wound dressing(s) wet. Protect dressing(s) with water repellant cover (for example, large plastic bag) or a cast cover and may then take shower. Edema Control - Lymphedema / SCD / Other: Elevate legs to the level of the heart or above for 30 minutes daily and/or when sitting for 3-4 times a day throughout the day. Avoid standing for long periods of time. Exercise regularly Compression stocking or Garment 20-30 mm/Hg pressure to: - start wearing the juxtalite HD apply in the morning and remove at night left leg. Non Wound Condition: Protect area with: - buttock area with AandD ointment daily WOUND #4: - Lower Leg Wound Laterality: Left, Anterior, Proximal Cleanser: Wound Cleanser (Generic) 1 x Per Day/30 Days Discharge Instructions: Cleanse the wound with wound cleanser prior to applying a clean dressing using gauze sponges, not tissue or cotton balls. Peri-Wound Care: Skin Prep (DME) (Generic) 1 x Per Day/30 Days Discharge Instructions: Use skin prep as directed Topical: blastx 1 x Per Day/30  Days Discharge Instructions: apply a layer to wound bed. Prim Dressing: Hydrofera Blue Ready Transfer Foam, 4x5 (in/in) 1 x Per Day/30 Days ary Discharge Instructions: Apply to wound bed as instructed Secondary Dressing: Zetuvit Plus Silicone Border Dressing 4x4 (in/in) (DME) (Generic) 1 x Per Day/30 Days Discharge Instructions: Apply silicone border over primary dressing as directed. Com pression Stockings: Circaid Juxta Lite Compression Wrap Compression Amount: 20-30 mmHg (left) Discharge Instructions: Apply Circaid Juxta Lite Compression Wrap daily as instructed. Apply first thing in the morning, remove at night before bed. WOUND #5: - Lower Leg Wound Laterality: Left, Anterior, Distal Cleanser: Wound Cleanser (Generic) 1 x Per Day/30 Days Discharge Instructions: Cleanse the wound with wound cleanser prior to applying a clean dressing using gauze sponges, not tissue or cotton balls. Topical: blastx 1 x Per Day/30 Days Discharge Instructions: apply a layer to wound bed.  Prim Dressing: Hydrofera Blue Ready Transfer Foam, 4x5 (in/in) 1 x Per Day/30 Days ary Discharge Instructions: Apply to wound bed as instructed Secondary Dressing: Zetuvit Plus Silicone Border Dressing 5x5 (in/in) (DME) (Generic) 1 x Per Day/30 Days Discharge Instructions: Apply silicone border over primary dressing as directed. 1. In office sharp debridement 2. Blast X and Hydrofera Blue under compression wrap daily 3. Follow-up in 1 week Electronic Signature(s) Signed: 08/29/2022 12:24:04 PM By: Deon Pilling RN, BSN Signed: 08/29/2022 1:15:42 PM By: Kalman Shan DO Previous Signature: 08/18/2022 4:03:41 PM Version By: Kalman Shan DO Entered By: Deon Pilling on 08/29/2022 12:18:43 -------------------------------------------------------------------------------- HxROS Details Patient Name: Date of Service: [name], DO [name] 08/18/2022 1:30 PM Medical Record Number: [id number] Patient Account Number:  [id number] Date of Birth/Sex: Treating RN: [birth year] (77 y.o. F) Primary Care Provider: Lorene Dy Other Clinician: Referring Provider: Treating Provider/Extender: [name] in Treatment: 10 Information Obtained From Patient Eyes Medical History: Negative for: Cataracts; Optic Neuritis LADAJA, GLEICH ([id number]) [id number] Page 9 of 10 Ear/Nose/Mouth/Throat Medical History: Negative for: Chronic sinus problems/congestion Hematologic/Lymphatic Medical History: Negative for: Anemia; Hemophilia; Human Immunodeficiency Virus; Lymphedema; Sickle Cell Disease Respiratory Medical History: Positive for: Asthma; Sleep Apnea Negative for: Aspiration; Chronic Obstructive Pulmonary Disease (COPD); Pneumothorax; Tuberculosis Past Medical History Notes: 4L [place] 02 pulmonary fibrosis Cardiovascular Medical History: Positive for: Hypertension Negative for: Angina; Arrhythmia; Congestive Heart Failure; Coronary Artery Disease; Deep Vein Thrombosis; Hypotension; Myocardial Infarction; Peripheral Arterial Disease; Peripheral Venous Disease; Phlebitis; Vasculitis Gastrointestinal Medical History: Positive for: Colitis Negative for: Cirrhosis ; Crohns; Hepatitis A; Hepatitis B; Hepatitis C Endocrine Medical History: Negative for: Type I Diabetes; Type II Diabetes Immunological Medical History: Negative for: Lupus Erythematosus; Raynauds; Scleroderma Integumentary (Skin) Medical History: Negative for: History of Burn Musculoskeletal Medical History: Positive for: Rheumatoid Arthritis; Osteoarthritis Negative for: Gout; Osteomyelitis Neurologic Medical History: Negative for: Dementia; Neuropathy; Quadriplegia; Paraplegia; Seizure Disorder Oncologic Medical History: Negative for: Received Chemotherapy; Received Radiation Psychiatric Medical History: Negative for: Anorexia/bulimia; Confinement  Anxiety Immunizations Pneumococcal Vaccine: Received Pneumococcal Vaccination: Yes Received Pneumococcal Vaccination On or After 60th Birthday: Yes Implantable Devices No devices added Hospitalization / Surgery History Type of Hospitalization/Surgery NOORA, BARRICKMAN ([id number]) [id number] Page 10 of 10 surgery toe right hip replacement 12/2018 heart cath 12/17/2021 Family and Social History Cancer: Yes - Maternal Grandparents; Diabetes: Yes - Siblings; Heart Disease: Yes - Maternal Grandparents,Paternal Grandparents,Mother,Father,Siblings; Hereditary Spherocytosis: No; Hypertension: Yes - Mother,Father,Siblings; Kidney Disease: No; Lung Disease: No; Seizures: No; Stroke: Yes - Paternal Grandparents; Thyroid Problems: No; Tuberculosis: No; Never smoker; Marital Status - Married; Alcohol Use: Moderate; Drug Use: No History; Caffeine Use: Moderate; Financial Concerns: No; Food, Clothing or Shelter Needs: No; Support System Lacking: No; Transportation Concerns: No Electronic Signature(s) Signed: 08/18/2022 4:03:41 PM By: Kalman Shan DO Entered By: Kalman Shan on 08/18/2022 14:13:48 -------------------------------------------------------------------------------- SuperBill Details Patient Name: Date of Service: [name], DO [name] 08/18/2022 Medical Record Number: [id number] Patient Account Number: [id number] Date of Birth/Sex: Treating RN: 1945-12-07 (77 y.o. [name] Primary Care Provider: Lorene Dy Other Clinician: Referring Provider: Treating Provider/Extender: [name] in Treatment: 10 Diagnosis Coding ICD-10 Codes Code Description [phone] Non-pressure chronic ulcer of other part of left lower leg with other specified severity I87.312 Chronic venous hypertension (idiopathic) with ulcer of left lower extremity J84.9 Interstitial pulmonary disease, unspecified M06.9 Rheumatoid arthritis,  unspecified Facility Procedures : CPT4 Code: [id number] Description: [id number] - DEB SUBQ TISSUE 20 SQ CM/< ICD-10 Diagnosis  Description L97.828 Non-pressure chronic ulcer of other part of left lower leg with other specified I87.312 Chronic venous hypertension (idiopathic) with ulcer of left lower extremity Modifier: severity Quantity: 1 Physician Procedures : CPT4 Code Description Modifier [id number] - WC PHYS SUBQ TISS 20 SQ CM ICD-10 Diagnosis Description L97.828 Non-pressure chronic ulcer of other part of left lower leg with other specified severity I87.312 Chronic venous hypertension (idiopathic)  with ulcer of left lower extremity Quantity: 1 Electronic Signature(s) Signed: 08/18/2022 4:03:41 PM By: Kalman Shan DO Entered By: Kalman Shan on 08/18/2022 14:15:36

## 2022-08-19 NOTE — Progress Notes (Signed)
BONNI, NEUSER ([id number]) [id number] Page 1 of 10 Visit Report for 08/18/2022 Arrival Information Details Patient Name: Date of Service: Hailey Courser, [name] Hailey THY S. 08/18/2022 1:30 PM Medical Record Number: [id number] Patient Account Number: [id number] Date of Birth/Sex: Treating [name]: Jan 30, 1946 (77 y.o. F) Primary Care Amiee Wiley: Lorene Dy Other Clinician: Referring Sabino Denning: Treating Corrissa Martello/Extender: [name] in Treatment: 10 Visit Information History Since Last Visit Added or deleted any medications: No Patient Arrived: Wheel Chair Any new allergies or adverse reactions: No Arrival Time: 13:31 Had a fall or experienced change in No Accompanied By: husband activities of daily living that may affect Transfer Assistance: None risk of falls: Patient Identification Verified: Yes Signs or symptoms of abuse/neglect since last visito No Secondary Verification Process Completed: Yes Has Compression in Place as Prescribed: Yes Patient Requires Transmission-Based Precautions: No Pain Present Now: No Patient Has Alerts: Yes Patient Alerts: ABI's: L: N/C 11/23 Electronic Signature(s) Signed: 08/19/2022 12:35:45 PM By: [name] Entered By: [name] on 08/18/2022 13:37:12 -------------------------------------------------------------------------------- Encounter Discharge Information Details Patient Name: Date of Service: Hailey [name], [name] Hailey THY S. 08/18/2022 1:30 PM Medical Record Number: [id number] Patient Account Number: [id number] Date of Birth/Sex: Treating [name]: 1945-10-07 (77 y.o. [name], [name] Primary Care Jovon Streetman: Lorene Dy Other Clinician: Referring Corbyn Wildey: Treating Stefanos Haynesworth/Extender: [name] in Treatment: 10 Encounter Discharge Information Items Post Procedure Vitals Discharge Condition: Stable Temperature (F): 98.1 Ambulatory Status: Wheelchair Pulse (bpm): 73 Discharge  Destination: Home Respiratory Rate (breaths/min): 20 Transportation: Private Auto Blood Pressure (mmHg): 147/74 Accompanied By: husband Schedule Follow-up Appointment: Yes Clinical Summary of Care: Electronic Signature(s) Signed: 08/18/2022 5:30:52 PM By: Hailey Pilling [name], [name] Entered By: [name] on 08/18/2022 14:08:02 Lower Extremity Assessment Details -------------------------------------------------------------------------------- [name] ([id number]) [id number] Page 2 of 10 Patient Name: Date of Service: Hailey Courser, [name] Hailey THY S. 08/18/2022 1:30 PM Medical Record Number: [id number] Patient Account Number: [id number] Date of Birth/Sex: Treating [name]: 1946-03-16 (77 y.o. F) Primary Care Naydeline Morace: Lorene Dy Other Clinician: Referring Alieu Finnigan: Treating Akire Rennert/Extender: [name] in Treatment: 10 Edema Assessment Left: Right: Assessed: No No Edema: Yes Calf Left: Right: Point of Measurement: 29 cm From Medial Instep 31.5 cm Ankle Left: Right: Point of Measurement: 9 cm From Medial Instep 25 cm Electronic Signature(s) Signed: 08/19/2022 12:35:45 PM By: [name] Entered By: [name] on 08/18/2022 13:44:45 -------------------------------------------------------------------------------- Multi Wound Chart Details Patient Name: Date of Service: Hailey Courser, [name] Hailey THY S. 08/18/2022 1:30 PM Medical Record Number: [id number] Patient Account Number: [id number] Date of Birth/Sex: Treating [name]: Mar 17, 1946 (77 y.o. F) Primary Care Wendal Wilkie: Lorene Dy Other Clinician: Referring Deshawna Mcneece: Treating [name]/Extender: [name] in Treatment: 10 Vital Signs Height(in): 61 Pulse(bpm): 73 Weight(lbs): 186 Blood Pressure(mmHg): 147/74 Body Mass Index(BMI): 35.1 Temperature(F): 98.1 Respiratory Rate(breaths/min): 20 [4:Photos:] [N/A:N/A] Left, Proximal, Anterior Lower Leg Left,  Distal, Anterior Lower Leg N/A Wound Location: Gradually Appeared Gradually Appeared N/A Wounding Event: Cellulitis Cellulitis N/A Primary Etiology: Asthma, Sleep Apnea, Hypertension, Asthma, Sleep Apnea, Hypertension, N/A Comorbid History: Colitis, Rheumatoid Arthritis, Colitis, Rheumatoid Arthritis, Osteoarthritis Osteoarthritis 04/19/2022 04/19/2022 N/A Date Acquired: 10 10 N/A Weeks of Treatment: Open Open N/A Wound Status: No No N/A Wound Recurrence: 1.1x0.6x0.2 5x3.1x0.2 N/A Measurements L x W x D (cm) 0.518 12.174 N/A A (cm) : rea 0.104 2.435 N/A Volume (cm) : 87.80% 21.70% N/A % Reduction in Area: 91.80% -56.60% N/A % Reduction in VolumeDEMARIA, DEENEY ([id number]) [id number]  Page 3 of 10 Full Thickness With Exposed Support Full Thickness With Exposed Support N/A Classification: Structures Structures Medium Medium N/A Exudate A mount: Serosanguineous Serosanguineous N/A Exudate Type: red, brown red, brown N/A Exudate Color: Distinct, outline attached Distinct, outline attached N/A Wound Margin: Large (67-100%) Large (67-100%) N/A Granulation A mount: Pink, Pale Pink, Pale N/A Granulation Quality: None Present (0%) Small (1-33%) N/A Necrotic A mount: Fat Layer (Subcutaneous Tissue): Yes Fat Layer (Subcutaneous Tissue): Yes N/A Exposed Structures: Fascia: No Tendon: Yes Tendon: No Fascia: No Muscle: No Muscle: No Joint: No Joint: No Bone: No Bone: No Large (67-100%) Small (1-33%) N/A Epithelialization: N/A Debridement - Excisional N/A Debridement: Pre-procedure Verification/Time Out N/A 14:00 N/A Taken: N/A Lidocaine 5% topical ointment N/A Pain Control: N/A Subcutaneous, Slough N/A Tissue Debrided: N/A Skin/Subcutaneous Tissue N/A Level: N/A 15.5 N/A Debridement A (sq cm): rea N/A Curette N/A Instrument: N/A Moderate N/A Bleeding: N/A Pressure N/A Hemostasis A chieved: N/A 0 N/A Procedural Pain: N/A 2  N/A Post Procedural Pain: N/A Procedure was tolerated well N/A Debridement Treatment Response: N/A 5x3.1x0.2 N/A Post Debridement Measurements L x W x D (cm) N/A 2.435 N/A Post Debridement Volume: (cm) Excoriation: No Excoriation: No N/A Periwound Skin Texture: Induration: No Induration: No Callus: No Callus: No Crepitus: No Crepitus: No Rash: No Rash: No Scarring: No Scarring: No Maceration: No Maceration: No N/A Periwound Skin Moisture: Dry/Scaly: No Dry/Scaly: No Atrophie Blanche: No Atrophie Blanche: No N/A Periwound Skin Color: Cyanosis: No Cyanosis: No Ecchymosis: No Ecchymosis: No Erythema: No Erythema: No Hemosiderin Staining: No Hemosiderin Staining: No Mottled: No Mottled: No Pallor: No Pallor: No Rubor: No Rubor: No No Abnormality No Abnormality N/A Temperature: Yes Yes N/A Tenderness on Palpation: N/A Debridement N/A Procedures Performed: Treatment Notes Wound #4 (Lower Leg) Wound Laterality: Left, Anterior, Proximal Cleanser Wound Cleanser Discharge Instruction: Cleanse the wound with wound cleanser prior to applying a clean dressing using gauze sponges, not tissue or cotton balls. Peri-Wound Care Topical blastx Discharge Instruction: apply a layer to wound bed. Primary Dressing Hydrofera [name] Ready Transfer Foam, 4x5 (in/in) Discharge Instruction: Apply to wound bed as instructed Secondary Dressing ABD Pad, 8x10 Discharge Instruction: Apply over primary dressing as directed. Secured With [employer], 4.5x3.1 (in/yd) Discharge Instruction: Secure with Kerlix as directed. 46M Medipore H Soft Cloth Surgical T ape, 4 x 10 (in/yd) Discharge Instruction: Secure with tape as directed. Compression ASHLEYMARIE, GRANDERSON ([id number]) [id number] Page 4 of 10 Compression Stockings Circaid Juxta Lite Compression Wrap Quantity: 1 Left Leg Compression Amount: 20-30 mmHg Discharge Instruction: Apply Circaid  Juxta Lite Compression Wrap daily as instructed. Apply first thing in the morning, remove at night before bed. Add-Ons Wound #5 (Lower Leg) Wound Laterality: Left, Anterior, Distal Cleanser Wound Cleanser Discharge Instruction: Cleanse the wound with wound cleanser prior to applying a clean dressing using gauze sponges, not tissue or cotton balls. Peri-Wound Care Topical blastx Discharge Instruction: apply a layer to wound bed. Primary Dressing Hydrofera [name] Ready Transfer Foam, 4x5 (in/in) Discharge Instruction: Apply to wound bed as instructed Secondary Dressing ABD Pad, 8x10 Discharge Instruction: Apply over primary dressing as directed. Secured With [employer], 4.5x3.1 (in/yd) Discharge Instruction: Secure with Kerlix as directed. 46M Medipore H Soft Cloth Surgical T ape, 4 x 10 (in/yd) Discharge Instruction: Secure with tape as directed. Compression Wrap Compression Stockings Add-Ons Electronic Signature(s) Signed: 08/18/2022 4:03:41 PM By: [name] Entered By: [name] on 08/18/2022 14:13:10 -------------------------------------------------------------------------------- Multi-Disciplinary Care Plan Details Patient Name: Date of  Service: Hailey [name], [name] Hailey THY S. 08/18/2022 1:30 PM Medical Record Number: [id number] Patient Account Number: [id number] Date of Birth/Sex: Treating [name]: July 12, 1946 (77 y.o. [name], [name] Primary Care Emeric Novinger: Lorene Dy Other Clinician: Referring Mattie Nordell: Treating Khalen Styer/Extender: [name] in Treatment: 10 Active Inactive Pain, Acute or Chronic Nursing Diagnoses: Pain, acute or chronic: actual or potential Potential alteration in comfort, pain Goals: Patient will verbalize adequate pain control and receive pain control interventions during procedures as needed SKYLEEN, BENTLEY ([id number]) [phone].pdf Page 5 of 10 Date Initiated: 06/03/2022 Target  Resolution Date: 09/16/2022 Goal Status: Active Patient/caregiver will verbalize comfort level met Date Initiated: 06/03/2022 Target Resolution Date: 09/16/2022 Goal Status: Active Interventions: Encourage patient to take pain medications as prescribed Provide education on pain management Treatment Activities: Administer pain control measures as ordered : 06/03/2022 Notes: Venous Leg Ulcer Nursing Diagnoses: Potential for venous Insuffiency (use before diagnosis confirmed) Goals: Patient will maintain optimal edema control Date Initiated: 06/03/2022 Target Resolution Date: 09/16/2022 Goal Status: Active Interventions: Assess peripheral edema status every visit. Compression as ordered Provide education on venous insufficiency Treatment Activities: Therapeutic compression applied : 06/03/2022 Notes: Electronic Signature(s) Signed: 08/18/2022 5:30:52 PM By: Hailey Pilling [name], [name] Entered By: [name] on 08/18/2022 14:06:37 -------------------------------------------------------------------------------- Pain Assessment Details Patient Name: Date of Service: Hailey Courser, [name] Hailey THY S. 08/18/2022 1:30 PM Medical Record Number: [id number] Patient Account Number: [id number] Date of Birth/Sex: Treating [name]: [birth year] (77 y.o. F) Primary Care Chaley Castellanos: Lorene Dy Other Clinician: Referring Rice Walsh: Treating Felis Quillin/Extender: [name] in Treatment: 10 Active Problems Location of Pain Severity and Description of Pain Patient Has Paino No Site Locations [place], [place] ([id number]) [id number] Page 6 of 10 Pain Management and Medication Current Pain Management: Electronic Signature(s) Signed: 08/19/2022 12:35:45 PM By: [name] Entered By: [name] on 08/18/2022 13:37:46 -------------------------------------------------------------------------------- Patient/Caregiver Education Details Patient Name: Date of  Service: Hailey [name], [name] Hailey [name] 2/1/2024andnbsp1:30 PM Medical Record Number: [id number] Patient Account Number: [id number] Date of Birth/Gender: Treating [name]: December 28, 1945 (77 y.o. [name] Primary Care Physician: Lorene Dy Other Clinician: Referring Physician: Treating Physician/Extender: [name] in Treatment: 10 Education Assessment Education Provided To: Patient Education Topics Provided Wound/Skin Impairment: Handouts: Caring for Your Ulcer Methods: Explain/Verbal Responses: Reinforcements needed Electronic Signature(s) Signed: 08/18/2022 5:30:52 PM By: Hailey Pilling [name], [name] Entered By: [name] on 08/18/2022 14:06:57 -------------------------------------------------------------------------------- Wound Assessment Details Patient Name: Date of Service: Hailey [name], [name] Hailey THY S. 08/18/2022 1:30 PM Medical Record Number: [id number] Patient Account Number: [id number] Date of Birth/Sex: Treating [name]: 1945/08/12 (77 y.o. F) Primary Care Shaheed Schmuck: Lorene Dy Other Clinician: Barrie [name] ([id number]) [id number] Page 7 of 10 Referring [name]: Treating Senai Kingsley/Extender: [name] in Treatment: 10 Wound Status Wound Number: 4 Primary Cellulitis Etiology: Wound Location: Left, Proximal, Anterior Lower Leg Wound Open Wounding Event: Gradually Appeared Status: Date Acquired: 04/19/2022 Comorbid Asthma, Sleep Apnea, Hypertension, Colitis, Rheumatoid Weeks Of Treatment: 10 History: Arthritis, Osteoarthritis Clustered Wound: No Photos Wound Measurements Length: (cm) 1.1 Width: (cm) 0.6 Depth: (cm) 0.2 Area: (cm) 0.518 Volume: (cm) 0.104 % Reduction in Area: 87.8% % Reduction in Volume: 91.8% Epithelialization: Large (67-100%) Tunneling: No Undermining: No Wound Description Classification: Full Thickness With Exposed Support Structures Wound Margin: Distinct, outline  attached Exudate Amount: Medium Exudate Type: Serosanguineous Exudate Color: red, brown Foul Odor After Cleansing: No Slough/Fibrino Yes Wound Bed Granulation Amount: Large (67-100%) Exposed Structure Granulation Quality: Pink,  Pale Fascia Exposed: No Necrotic Amount: None Present (0%) Fat Layer (Subcutaneous Tissue) Exposed: Yes Tendon Exposed: No Muscle Exposed: No Joint Exposed: No Bone Exposed: No Periwound Skin Texture Texture Color No Abnormalities Noted: No No Abnormalities Noted: No Callus: No Atrophie Blanche: No Crepitus: No Cyanosis: No Excoriation: No Ecchymosis: No Induration: No Erythema: No Rash: No Hemosiderin Staining: No Scarring: No Mottled: No Pallor: No Moisture Rubor: No No Abnormalities Noted: No Dry / Scaly: No Temperature / Pain Maceration: No Temperature: No Abnormality Tenderness on Palpation: Yes Treatment Notes Wound #4 (Lower Leg) Wound Laterality: Left, Anterior, Proximal Cleanser Wound Cleanser Discharge Instruction: Cleanse the wound with wound cleanser prior to applying a clean dressing using gauze sponges, not tissue or cotton balls. Peri-Wound Care TENYA, ARAQUE ([id number]) [id number] Page 8 of 10 Topical blastx Discharge Instruction: apply a layer to wound bed. Primary Dressing Hydrofera [name] Ready Transfer Foam, 4x5 (in/in) Discharge Instruction: Apply to wound bed as instructed Secondary Dressing ABD Pad, 8x10 Discharge Instruction: Apply over primary dressing as directed. Secured With [employer], 4.5x3.1 (in/yd) Discharge Instruction: Secure with Kerlix as directed. 65M Medipore H Soft Cloth Surgical T ape, 4 x 10 (in/yd) Discharge Instruction: Secure with tape as directed. Compression Wrap Compression Stockings Circaid Juxta Lite Compression Wrap Quantity: 1 Left Leg Compression Amount: 20-30 mmHg Discharge Instruction: Apply Circaid Juxta Lite Compression Wrap daily as  instructed. Apply first thing in the morning, remove at night before bed. Add-Ons Electronic Signature(s) Signed: 08/19/2022 12:35:45 PM By: [name] Entered By: [name] on 08/18/2022 13:48:19 -------------------------------------------------------------------------------- Wound Assessment Details Patient Name: Date of Service: Hailey Courser, [name] Hailey THY S. 08/18/2022 1:30 PM Medical Record Number: [id number] Patient Account Number: [id number] Date of Birth/Sex: Treating [name]: 12-16-1945 (77 y.o. F) Primary Care Franchesca Veneziano: Lorene Dy Other Clinician: Referring Shalamar Plourde: Treating [name]/Extender: [name] in Treatment: 10 Wound Status Wound Number: 5 Primary Cellulitis Etiology: Wound Location: Left, Distal, Anterior Lower Leg Wound Open Wounding Event: Gradually Appeared Status: Date Acquired: 04/19/2022 Comorbid Asthma, Sleep Apnea, Hypertension, Colitis, Rheumatoid Weeks Of Treatment: 10 History: Arthritis, Osteoarthritis Clustered Wound: No Photos Wound Measurements Length: (cm) 5 Marcelino, Anabeth S ([id number]) Width: (cm) 3.1 Depth: (cm) 0.2 Area: (cm) 12.174 Volume: (cm) 2.435 % Reduction in Area: 21.7% [id number] Page 9 of 10 % Reduction in Volume: -56.6% Epithelialization: Small (1-33%) Tunneling: No Undermining: No Wound Description Classification: Full Thickness With Exposed Support Structures Wound Margin: Distinct, outline attached Exudate Amount: Medium Exudate Type: Serosanguineous Exudate Color: red, brown Foul Odor After Cleansing: No Slough/Fibrino Yes Wound Bed Granulation Amount: Large (67-100%) Exposed Structure Granulation Quality: Pink, Pale Fascia Exposed: No Necrotic Amount: Small (1-33%) Fat Layer (Subcutaneous Tissue) Exposed: Yes Tendon Exposed: Yes Muscle Exposed: No Joint Exposed: No Bone Exposed: No Periwound Skin Texture Texture Color No Abnormalities Noted: No No  Abnormalities Noted: No Callus: No Atrophie Blanche: No Crepitus: No Cyanosis: No Excoriation: No Ecchymosis: No Induration: No Erythema: No Rash: No Hemosiderin Staining: No Scarring: No Mottled: No Pallor: No Moisture Rubor: No No Abnormalities Noted: No Dry / Scaly: No Temperature / Pain Maceration: No Temperature: No Abnormality Tenderness on Palpation: Yes Treatment Notes Wound #5 (Lower Leg) Wound Laterality: Left, Anterior, Distal Cleanser Wound Cleanser Discharge Instruction: Cleanse the wound with wound cleanser prior to applying a clean dressing using gauze sponges, not tissue or cotton balls. Peri-Wound Care Topical blastx Discharge Instruction: apply a layer to wound bed. Primary Dressing Hydrofera [name] Ready Transfer Foam, 4x5 (in/in) Discharge  Instruction: Apply to wound bed as instructed Secondary Dressing ABD Pad, 8x10 Discharge Instruction: Apply over primary dressing as directed. Secured With [employer], 4.5x3.1 (in/yd) Discharge Instruction: Secure with Kerlix as directed. 60M Medipore H Soft Cloth Surgical T ape, 4 x 10 (in/yd) Discharge Instruction: Secure with tape as directed. Compression Wrap Compression Stockings Add-Ons Electronic Signature(s) Signed: 08/19/2022 12:35:45 PM By: [name] Entered By: [name] on 08/18/2022 13:48:59 [name] ([id number]) [id number] Page 10 of 10 -------------------------------------------------------------------------------- Vitals Details Patient Name: Date of Service: Hailey Courser, [name] Hailey THY S. 08/18/2022 1:30 PM Medical Record Number: [id number] Patient Account Number: [id number] Date of Birth/Sex: Treating [name]: January 28, 1946 (77 y.o. F) Primary Care Earleen Aoun: Lorene Dy Other Clinician: Referring Manley Fason: Treating Bria Portales/Extender: [name] in Treatment: 10 Vital Signs Time Taken: 13:37 Temperature (F): 98.1 Height  (in): 61 Pulse (bpm): 73 Weight (lbs): 186 Respiratory Rate (breaths/min): 20 Body Mass Index (BMI): 35.1 Blood Pressure (mmHg): 147/74 Reference Range: 80 - 120 mg / dl Electronic Signature(s) Signed: 08/19/2022 12:35:45 PM By: [name] Entered By: [name] on 08/18/2022 13:37:39

## 2022-08-26 ENCOUNTER — Encounter (HOSPITAL_BASED_OUTPATIENT_CLINIC_OR_DEPARTMENT_OTHER): Payer: Medicare Other | Admitting: Internal Medicine

## 2022-08-26 DIAGNOSIS — L97828 Non-pressure chronic ulcer of other part of left lower leg with other specified severity: Secondary | ICD-10-CM | POA: Diagnosis not present

## 2022-08-26 DIAGNOSIS — I87312 Chronic venous hypertension (idiopathic) with ulcer of left lower extremity: Secondary | ICD-10-CM

## 2022-08-27 NOTE — Progress Notes (Signed)
[name], [name] ([id number]) [id number] Page 1 of 12 Visit Report for 08/26/2022 Chief Complaint Document Details Patient Name: Date of Service: [place]. 08/26/2022 11:30 A M Medical Record Number: [id number] Patient Account Number: [id number] Date of Birth/Sex: Treating RN: 07-11-1946 (77 y.o. F) Primary Care Provider: Lorene Dy Other Clinician: Referring Provider: Treating Provider/Extender: [name] in Treatment: 12 Information Obtained from: Patient Chief Complaint 06/03/2022; Left lower extremity wounds Electronic Signature(s) Signed: 08/26/2022 12:26:02 PM By: Kalman Shan Hailey Entered By: Kalman Shan on 08/26/2022 12:18:20 -------------------------------------------------------------------------------- Cellular or Tissue Based Product Details Patient Name: Date of Service: [place]. 08/26/2022 11:30 A M Medical Record Number: [id number] Patient Account Number: [id number] Date of Birth/Sex: Treating RN: Nov 14, 1945 (77 y.o. [name], [name] Primary Care Provider: Lorene Dy Other Clinician: Referring Provider: Treating Provider/Extender: [name] in Treatment: 12 Cellular or Tissue Based Product Type Wound #5 Left,Distal,Anterior Lower Leg Applied to: Performed By: Physician Kalman Shan, Hailey Cellular or Tissue Based Product Type: Puraply AM Level of Consciousness (Pre-procedure): Awake and Alert Pre-procedure Verification/Time Out Yes - 12:00 Taken: Location: trunk / arms / legs Wound Size (sq cm): 14.21 Product Size (sq cm): 12 Waste Size (sq cm): 0 Amount of Product Applied (sq cm): 12 Instrument Used: Forceps, Scissors Lot #: [id number] Order #: 1 Expiration Date: 06/07/2024 Fenestrated: No Reconstituted: No Secured: Yes Secured With: Steri-Strips, adaptic Dressing Applied: Yes Primary Dressing: Blastx under the Puraply AM by  provider. Procedural Pain: 0 Post Procedural Pain: 0 Response to Treatment: Procedure was tolerated well Level of Consciousness (Post- Awake and Alert procedure): Post Procedure Diagnosis Same as [place], [place] ([id number]) [phone].pdf Page 2 of 12 Electronic Signature(s) Signed: 08/26/2022 12:26:02 PM By: Kalman Shan Hailey Signed: 08/26/2022 5:22:46 PM By: Deon Pilling RN, BSN Entered By: Deon Pilling on 08/26/2022 12:04:09 -------------------------------------------------------------------------------- Debridement Details Patient Name: Date of Service: [place]. 08/26/2022 11:30 A M Medical Record Number: [id number] Patient Account Number: [id number] Date of Birth/Sex: Treating RN: Mar 02, 1946 (77 y.o. [name], [name] Primary Care Provider: Lorene Dy Other Clinician: Referring Provider: Treating Provider/Extender: [name] in Treatment: 12 Debridement Performed for Assessment: Wound #4 Left,Proximal,Anterior Lower Leg Performed By: Physician Kalman Shan, Hailey Debridement Type: Debridement Level of Consciousness (Pre-procedure): Awake and Alert Pre-procedure Verification/Time Out Yes - 11:50 Taken: Start Time: 11:51 Pain Control: Lidocaine 4% T opical Solution T Area Debrided (L x W): otal 1 (cm) x 0.5 (cm) = 0.5 (cm) Tissue and other material debrided: Viable, Non-Viable, Slough, Subcutaneous, Skin: Dermis , Skin: Epidermis, Slough Level: Skin/Subcutaneous Tissue Debridement Description: Excisional Instrument: Curette Bleeding: Minimum Hemostasis Achieved: Pressure End Time: 11:58 Procedural Pain: 0 Post Procedural Pain: 0 Response to Treatment: Procedure was tolerated well Level of Consciousness (Post- Awake and Alert procedure): Post Debridement Measurements of Total Wound Length: (cm) 0.6 Width: (cm) 0.3 Depth: (cm) 0.1 Volume: (cm) 0.014 Character of Wound/Ulcer Post  Debridement: Improved Post Procedure Diagnosis Same as Pre-procedure Electronic Signature(s) Signed: 08/26/2022 12:26:02 PM By: Kalman Shan Hailey Signed: 08/26/2022 5:22:46 PM By: Deon Pilling RN, BSN Entered By: Deon Pilling on 08/26/2022 11:59:06 -------------------------------------------------------------------------------- Debridement Details Patient Name: Date of Service: Hailey Courser, [name] 08/26/2022 11:30 A M Medical Record Number: [id number] Patient Account Number: [id number] Date of Birth/Sex: Treating RN: [birth year] (77 y.o. [name], [name] Primary Care Provider: Lorene Dy Other Clinician: Referring Provider: Treating Provider/Extender: Heber [place]  [employer] [place], [place] ([id number]) [phone].pdf Page 3 of 12 Weeks in Treatment: 12 Debridement Performed for Assessment: Wound #5 Left,Distal,Anterior Lower Leg Performed By: Physician Kalman Shan, Hailey Debridement Type: Debridement Level of Consciousness (Pre-procedure): Awake and Alert Pre-procedure Verification/Time Out Yes - 11:50 Taken: Start Time: 11:51 Pain Control: Lidocaine 4% T opical Solution T Area Debrided (L x W): otal 4.9 (cm) x 2.9 (cm) = 14.21 (cm) Tissue and other material debrided: Viable, Non-Viable, Slough, Subcutaneous, Skin: Dermis , Skin: Epidermis, Slough Level: Skin/Subcutaneous Tissue Debridement Description: Excisional Instrument: Curette Bleeding: Minimum Hemostasis Achieved: Pressure End Time: 11:58 Procedural Pain: 0 Post Procedural Pain: 0 Response to Treatment: Procedure was tolerated well Level of Consciousness (Post- Awake and Alert procedure): Post Debridement Measurements of Total Wound Length: (cm) 4.9 Width: (cm) 2.9 Depth: (cm) 0.1 Volume: (cm) 1.116 Character of Wound/Ulcer Post Debridement: Improved Post Procedure Diagnosis Same as Pre-procedure Electronic Signature(s) Signed: 08/26/2022 12:26:02 PM By: Kalman Shan  Hailey Signed: 08/26/2022 5:22:46 PM By: Deon Pilling RN, BSN Entered By: Deon Pilling on 08/26/2022 12:01:42 -------------------------------------------------------------------------------- HPI Details Patient Name: Date of Service: Hailey Courser, [name] 08/26/2022 11:30 A M Medical Record Number: [id number] Patient Account Number: [id number] Date of Birth/Sex: Treating RN: 1946-06-29 (77 y.o. F) Primary Care Provider: Lorene Dy Other Clinician: Referring Provider: Treating Provider/Extender: [name] in Treatment: 12 History of Present Illness HPI Description: Patient presents today for initial evaluation and our clinic as a referral from the [facility] health system Department of orthopedics for evaluation and treatment of the wound to his his of the left foot at the base of the great toe. Currently the good news is the patient really does not have any significant pain which is excellent. She has been seen by Dr. Linus Salmons at [facility]: infectious disease clinic that is the regional Center for infectious disease. Subsequently the patient was cultured and positive for methicillin sensitive staph aureus. She has been on antibiotics including Cipro prior to surgery as will several days following surgery. She then had clindamycin which was changed to doxycycline. She took this for 10 days. Subsequently the pain had improved and there was no longer any possible draining from the wound according to the notes. The patient sedentary rate as well as C-reactive protein have returned to normal ranges. Currently per Dr. Henreitta Leber assessment there was one dehiscence with no sign of osteomyelitis. He therefore discontinue the antibiotics with the completion of the last three days Of doxycycline. Other than that he just set her for a follow-up as needed. The patient does not have diabetes and is not a current smoker. At this time her treatment that was recommended by the surgeon was  applying a Hydrocolloid dressing. Based on what I'm seeing at this point the patient actually has some Slough noted over the surface of the wound there really does not appear to be evidence of infection though I Hailey think she does require some sharp debridement to clear away the slough and help with appropriate wound healing. She does have areas of granulation buds noted. She may be a candidate for a skin substitute as well. We will see how things Hailey over the next period of time until follow-up. No fevers, chills, nausea, or vomiting noted at this time. 06/13/18 on evaluation today patient actually appears to be doing better in regard to her toe ulcer. She's been using the Prisma on this region and that seems to have done extremely well for her.  Fortunately there does not appear to be evidence of infection at this time which is good news. She did see her surgeon they were extremely pleased with the overall appearance of her wound. 06/27/18 on evaluation today patient actually appears to be doing very well in regard to her foot ulcer. She has been tolerating the dressing changes without complication which is great news. She did see her podiatrist they felt like everything is looking very nice as well and will please. SERIAH, DEGIDIO ([id number]) [id number] Page 4 of 12 07/05/2018 surgical wound on the left foot. She appears to be doing well. Still surface debrided to remove however in general post debridement the wound bed looks quite healthy it is come down significantly in terms of dimensions using silver collagen. The patient describes pain when her foot is elevated either in bed at night [sometimes keeps her awake] or when is propped up on a foot rest. She basically takes analgesics for this. She does not really describe claudication with activity however her activity is very limited by interstitial lung disease. Her ABIs initially in this clinic were noncompressible. She  is not a diabetic 07/20/2018; surgical wound on the left foot. Wound actually looks quite a bit better than I remember seeing this. She is still describing pain with her leg elevated at night that she does not get when the wound is supine. She does not really describe claudication but she is very limited by her pulmonary status. We have been using silver collagen. 1/17; the patient has been followed up by orthopedics and discharge. Her arterial studies were actually quite good and should not be contributing to any pain. Slight reduction in ABIs but otherwise normal. Her wound is closed. She saw Dr. Linus Salmons of infectious disease surrounding the surgery. By review of our notes she was not felt to have osteomyelitis. 06/03/2022 Ms. Pencie Debs is a 77 year old female with a past medical history of interstitial lung disease on chronic oxygen via nasal cannula, rheumatoid arthritis, chronic diastolic heart failure and OSA that presents to the clinic for a 1 to 44-monthhistory of nonhealing ulcer to the left lower extremity. On 04/20/2022 she was admitted to the hospital for left lower extremity cellulitis. She required 8 days of IV vancomycin and Unasyn followed by a 14-day course of Augmentin and doxycycline. She has been using Silvadene cream to the area. She does not use compression therapy but does own compression stockings. She currently denies signs of infection. 11/27; patient presents for follow-up she is using Medihoney and Hydrofera Blue to the wound beds. She has been using her Tubigrip. She has no issues or complaints today. 12/4; patient presents for follow-up. She continues to use Medihoney and Hydrofera Blue to the wound beds. Several attempts have been made to schedule ABIs with TBI's. We gave patient the number today to call to have these scheduled. She has been using Tubigrip. She has no issues or complaints today. 12/18; she continues to use Medihoney and Hydrofera Blue in general the  surface of the wound is looking somewhat better this week and measurements are slightly smaller. She did have her arterial studies done these were noncompressible bilaterally however TBI on the right of 0.69 on the left 0.62. Waveforms on the right were triphasic on the left biphasic to triphasic. This does not suggest significant arterial disease to make healing wounds at this location difficult. 12/29; patient presents for follow-up. She has been using Medihoney and Hydrofera Blue to the wound bed. She has  been using Tubigrip. 1/5; patient presents for follow-up. She is been using Medihoney and Hydrofera Blue to the wound bed along with Tubigrip. The Tubigrip was doubled at last clinic visit. There is been improvement in wound healing. 1/12; patient presents for follow-up. She has been using Medihoney and Hydrofera Blue to the wound bed along with Tubigrip. There continues to be improvement in wound healing. 1/19; patient presents for follow-up. She has been using Medihoney and Hydrofera Blue to the wound bed along with her juxta light compression daily. There is been improvement in wound measurements. Patient has no issues or complaints today. 1/25; patient presents for follow-up. She has been using blast X and collagen to the wound bed under juxta light compression daily. She has no issues or complaints today. There is been improvement in wound healing. 2/1; patient presents for follow-up. She has been using blast X and collagen to the wound bed under juxta light compression daily. She has no issues or complaints today. 2/9; patient presents for follow-up. She has been using Hydrofera Blue with blast X under compression wrap daily. She has been approved for a skin substitute and patient would like to proceed with this. Electronic Signature(s) Signed: 08/26/2022 12:26:02 PM By: Kalman Shan Hailey Entered By: Kalman Shan on 08/26/2022  12:18:57 -------------------------------------------------------------------------------- Physical Exam Details Patient Name: Date of Service: Hailey Courser, [name] 08/26/2022 11:30 A M Medical Record Number: [id number] Patient Account Number: [id number] Date of Birth/Sex: Treating RN: 09/25/1945 (77 y.o. F) Primary Care Provider: Lorene Dy Other Clinician: Referring Provider: Treating Provider/Extender: [name] in Treatment: 12 Constitutional respirations regular, non-labored and within target range for patient.. Cardiovascular 2+ dorsalis pedis/posterior tibialis pulses. Psychiatric pleasant and cooperative. Notes T the lateral aspect of the left lower extremity there are 2 open wounds with granulation tissue and slough. Good edema control. Obvious deformity to the foot o from previous surgeries. No signs of surrounding soft tissue infection. SHAMILLE, BARNGROVER ([id number]) [id number] Page 5 of 12 Electronic Signature(s) Signed: 08/26/2022 12:26:02 PM By: Kalman Shan Hailey Entered By: Kalman Shan on 08/26/2022 12:19:29 -------------------------------------------------------------------------------- Physician Orders Details Patient Name: Date of Service: [place]. 08/26/2022 11:30 A M Medical Record Number: [id number] Patient Account Number: [id number] Date of Birth/Sex: Treating RN: 10-12-1945 (77 y.o. [name] Primary Care Provider: Lorene Dy Other Clinician: Referring Provider: Treating Provider/Extender: [name] in Treatment: 12 Verbal / Phone Orders: No Diagnosis Coding Follow-up Appointments ppointment in 1 week. - Dr. Heber [place] 1200 09/01/2022 Thursday (already has this appt) Return A ppointment in 2 weeks. - Dr. Heber [place] 1030 09/09/2022 Friday Return A Other: - [employer] blastx and collagen. . Anesthetic (In clinic) Topical Lidocaine 5% applied to  wound bed Cellular or Tissue Based Products Wound #5 Left,Distal,Anterior Lower Leg Cellular or Tissue Based Product Type: - 08/18/2022 insurance ran organogenesis- 100% covered. 08/26/2022 #1 Puraply AM applied. Cellular or Tissue Based Product applied to wound bed, secured with steri-strips, cover with Adaptic or Mepitel. (Hailey NOT REMOVE). Bathing/ Shower/ Hygiene May shower with protection but Hailey not get wound dressing(s) wet. Protect dressing(s) with water repellant cover (for example, large plastic bag) or a cast cover and may then take shower. Edema Control - Lymphedema / SCD / Other Elevate legs to the level of the heart or above for 30 minutes daily and/or when sitting for 3-4 times a day throughout the day. Avoid standing for long periods of time. Exercise regularly  Compression stocking or Garment 20-30 mm/Hg pressure to: - start wearing the juxtalite HD apply in the morning and remove at night left leg. Non Wound Condition Protect area with: - buttock area with AandD ointment daily Wound Treatment Wound #4 - Lower Leg Wound Laterality: Left, Anterior, Proximal Cleanser: Wound Cleanser (Generic) 1 x Per Day/30 Days Discharge Instructions: Cleanse the wound with wound cleanser prior to applying a clean dressing using gauze sponges, not tissue or cotton balls. Peri-Wound Care: Skin Prep (Generic) 1 x Per Day/30 Days Discharge Instructions: Use skin prep as directed Topical: blastx 1 x Per Day/30 Days Discharge Instructions: apply a layer to wound bed. Prim Dressing: Hydrofera Blue Ready Transfer Foam, 4x5 (in/in) 1 x Per Day/30 Days ary Discharge Instructions: Apply to wound bed as instructed Secondary Dressing: Zetuvit Plus Silicone Border Dressing 4x4 (in/in) (Generic) 1 x Per Day/30 Days Discharge Instructions: Apply silicone border over primary dressing as directed. Compression Stockings: Circaid Juxta Lite Compression Wrap Left Leg Compression Amount: 20-30 mmHG Discharge  Instructions: Apply Circaid Juxta Lite Compression Wrap daily as instructed. Apply first thing in the morning, remove at night before bed. Wound #5 - Lower Leg Wound Laterality: Left, Anterior, Distal Cleanser: Wound Cleanser (Generic) 1 x Per Week/30 Days Discharge Instructions: Cleanse the wound with wound cleanser prior to applying a clean dressing using gauze sponges, not tissue or cotton balls. AIJALON, GREENHILL ([id number]) [id number] Page 6 of 12 Topical: blastx 1 x Per Week/30 Days Discharge Instructions: applied under the puraply am. Prim Dressing: Puraply #1 1 x Per Week/30 Days ary Discharge Instructions: applied by provider. Prim Dressing: Adaptic and steri-strips 1 x Per Week/30 Days ary Discharge Instructions: Used to secure Puraply AM. Leave in place. Hailey not remove. Secondary Dressing: Woven Gauze Sponge, Non-Sterile 4x4 in 1 x Per Week/30 Days Discharge Instructions: Apply over primary dressing as directed. Secondary Dressing: Zetuvit Plus Silicone Border Dressing 5x5 (in/in) (Generic) 1 x Per Week/30 Days Discharge Instructions: Apply silicone border over primary dressing as directed. Electronic Signature(s) Signed: 08/26/2022 12:26:02 PM By: Kalman Shan Hailey Entered By: Kalman Shan on 08/26/2022 12:19:37 -------------------------------------------------------------------------------- Problem List Details Patient Name: Date of Service: Hailey Courser, Hailey RO [place]. 08/26/2022 11:30 A M Medical Record Number: [id number] Patient Account Number: [id number] Date of Birth/Sex: Treating RN: [birth year] (77 y.o. F) Primary Care Provider: Lorene Dy Other Clinician: Referring Provider: Treating Provider/Extender: [name] in Treatment: 12 Active Problems ICD-10 Encounter Code Description Active Date MDM Diagnosis L97.828 Non-pressure chronic ulcer of other part of left lower leg with other specified 06/03/2022 No  Yes severity I87.312 Chronic venous hypertension (idiopathic) with ulcer of left lower extremity 06/03/2022 No Yes J84.9 Interstitial pulmonary disease, unspecified 06/03/2022 No Yes M06.9 Rheumatoid arthritis, unspecified 06/03/2022 No Yes Inactive Problems Resolved Problems Electronic Signature(s) Signed: 08/26/2022 12:26:02 PM By: Kalman Shan Hailey Entered By: Kalman Shan on 08/26/2022 12:18:05 [name] ([id number]) [id number] Page 7 of 12 -------------------------------------------------------------------------------- Progress Note Details Patient Name: Date of Service: [place]. 08/26/2022 11:30 A M Medical Record Number: [id number] Patient Account Number: [id number] Date of Birth/Sex: Treating RN: 12-17-1945 (77 y.o. F) Primary Care Provider: Lorene Dy Other Clinician: Referring Provider: Treating Provider/Extender: [name] in Treatment: 12 Subjective Chief Complaint Information obtained from Patient 06/03/2022; Left lower extremity wounds History of Present Illness (HPI) Patient presents today for initial evaluation and our clinic as a referral from the [facility] health system Department of orthopedics  for evaluation and treatment of the wound to his his of the left foot at the base of the great toe. Currently the good news is the patient really does not have any significant pain which is excellent. She has been seen by Dr. Linus Salmons at [facility]: infectious disease clinic that is the regional Center for infectious disease. Subsequently the patient was cultured and positive for methicillin sensitive staph aureus. She has been on antibiotics including Cipro prior to surgery as will several days following surgery. She then had clindamycin which was changed to doxycycline. She took this for 10 days. Subsequently the pain had improved and there was no longer any possible draining from the wound  according to the notes. The patient sedentary rate as well as C-reactive protein have returned to normal ranges. Currently per Dr. Henreitta Leber assessment there was one dehiscence with no sign of osteomyelitis. He therefore discontinue the antibiotics with the completion of the last three days Of doxycycline. Other than that he just set her for a follow-up as needed. The patient does not have diabetes and is not a current smoker. At this time her treatment that was recommended by the surgeon was applying a Hydrocolloid dressing. Based on what I'm seeing at this point the patient actually has some Slough noted over the surface of the wound there really does not appear to be evidence of infection though I Hailey think she does require some sharp debridement to clear away the slough and help with appropriate wound healing. She does have areas of granulation buds noted. She may be a candidate for a skin substitute as well. We will see how things Hailey over the next period of time until follow-up. No fevers, chills, nausea, or vomiting noted at this time. 06/13/18 on evaluation today patient actually appears to be doing better in regard to her toe ulcer. She's been using the Prisma on this region and that seems to have done extremely well for her. Fortunately there does not appear to be evidence of infection at this time which is good news. She did see her surgeon they were extremely pleased with the overall appearance of her wound. 06/27/18 on evaluation today patient actually appears to be doing very well in regard to her foot ulcer. She has been tolerating the dressing changes without complication which is great news. She did see her podiatrist they felt like everything is looking very nice as well and will please. 07/05/2018 surgical wound on the left foot. She appears to be doing well. Still surface debrided to remove however in general post debridement the wound bed looks quite healthy it is come down  significantly in terms of dimensions using silver collagen. The patient describes pain when her foot is elevated either in bed at night [sometimes keeps her awake] or when is propped up on a foot rest. She basically takes analgesics for this. She does not really describe claudication with activity however her activity is very limited by interstitial lung disease. Her ABIs initially in this clinic were noncompressible. She is not a diabetic 07/20/2018; surgical wound on the left foot. Wound actually looks quite a bit better than I remember seeing this. She is still describing pain with her leg elevated at night that she does not get when the wound is supine. She does not really describe claudication but she is very limited by her pulmonary status. We have been using silver collagen. 1/17; the patient has been followed up by orthopedics and discharge. Her arterial studies were actually  quite good and should not be contributing to any pain. Slight reduction in ABIs but otherwise normal. Her wound is closed. She saw Dr. Linus Salmons of infectious disease surrounding the surgery. By review of our notes she was not felt to have osteomyelitis. 06/03/2022 Ms. 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 On 04/20/2022 she was admitted to the hospital for left lower extremity cellulitis. She required 8 days of IV vancomycin and Unasyn followed by a 14-day course of Augmentin and doxycycline. She has been using Silvadene cream to the area. She does not use compression therapy but does own compression stockings. She currently denies signs of infection. 11/27; patient presents for follow-up she is using Medihoney and Hydrofera Blue to the wound beds. She has been using her  Tubigrip. She has no issues or complaints today. 12/4; patient presents for follow-up. She continues to use Medihoney and Hydrofera Blue to the wound beds. Several attempts have been made to schedule ABIs with TBI's. We gave patient the number today to call to have these scheduled. She has been using Tubigrip. She has no issues or complaints today. 12/18; she continues to use Medihoney and Hydrofera Blue in general the surface of the wound is looking somewhat better this week and measurements are slightly smaller. She did have her arterial studies done these were noncompressible bilaterally however TBI on the right of 0.69 on the left 0.62. Waveforms on the right were triphasic on the left biphasic to triphasic. This does not suggest significant arterial disease to make healing wounds at this location difficult. 12/29; patient presents for follow-up. She has been using Medihoney and Hydrofera Blue to the wound bed. She has been using Tubigrip. 1/5; patient presents for follow-up. She is been using Medihoney and Hydrofera Blue to the wound bed along with Tubigrip. The Tubigrip was doubled at last clinic visit. There is been improvement in wound healing. 1/12; patient presents for follow-up. She has been using Medihoney and Hydrofera Blue to the wound bed along with Tubigrip. There continues to be improvement in wound healing. 1/19; patient presents for follow-up. She has been using Medihoney and Hydrofera Blue to the wound bed along with her juxta light compression daily. There is been improvement in wound measurements. Patient has no issues or complaints today. 1/25; patient presents for follow-up. She has been using blast X and collagen to the wound bed under juxta light compression daily. She has no issues or complaints today. There is been improvement in wound healing. 2/1; patient presents for follow-up. She has been using blast X and collagen to the wound bed under juxta light compression daily.  She has no issues or [name], [name]([id number] Page 8 of 12 complaints today. 2/9; patient presents for follow-up. She has been using Hydrofera Blue with blast X under compression wrap daily. She has been approved for a skin substitute and patient would like to proceed with this. Patient History Information obtained from Patient. Family History Cancer - Maternal Grandparents, Diabetes - Siblings, Heart Disease - Maternal Grandparents,Paternal Grandparents,Mother,Father,Siblings, Hypertension - Mother,Father,Siblings, Stroke - Paternal Grandparents, No family history of Hereditary Spherocytosis, Kidney Disease, Lung Disease, Seizures, Thyroid Problems, Tuberculosis. Social History Never smoker, Marital Status - Married, Alcohol Use - Moderate, Drug Use - No History, Caffeine Use -  Moderate. Medical History Eyes Denies history of Cataracts, Optic Neuritis Ear/Nose/Mouth/Throat Denies history of Chronic sinus problems/congestion Hematologic/Lymphatic Denies history of Anemia, Hemophilia, Human Immunodeficiency Virus, Lymphedema, Sickle Cell Disease Respiratory Patient has history of Asthma, Sleep Apnea Denies history of Aspiration, Chronic Obstructive Pulmonary Disease (COPD), Pneumothorax, Tuberculosis Cardiovascular Patient has history of Hypertension Denies history of Angina, Arrhythmia, Congestive Heart Failure, Coronary Artery Disease, Deep Vein Thrombosis, Hypotension, Myocardial Infarction, Peripheral Arterial Disease, Peripheral Venous Disease, Phlebitis, Vasculitis Gastrointestinal Patient has history of Colitis Denies history of Cirrhosis , Crohnoos, Hepatitis A, Hepatitis B, Hepatitis C Endocrine Denies history of Type I Diabetes, Type II Diabetes Immunological Denies history of Lupus Erythematosus, Raynaudoos, Scleroderma Integumentary (Skin) Denies history of History of Burn Musculoskeletal Patient has history of Rheumatoid  Arthritis, Osteoarthritis Denies history of Gout, Osteomyelitis Neurologic Denies history of Dementia, Neuropathy, Quadriplegia, Paraplegia, Seizure Disorder Oncologic Denies history of Received Chemotherapy, Received Radiation Psychiatric Denies history of Anorexia/bulimia, Confinement Anxiety Hospitalization/Surgery History - surgery toe. - right hip replacement 12/2018. - heart cath 12/17/2021. Medical A Surgical History Notes nd Respiratory 4L [place] 02 pulmonary fibrosis Objective Constitutional respirations regular, non-labored and within target range for patient.. Vitals Time Taken: 11:31 AM, Height: 61 in, Weight: 186 lbs, BMI: 35.1, Temperature: 98.3 F, Pulse: 62 bpm, Respiratory Rate: 20 breaths/min, Blood Pressure: 119/68 mmHg. Cardiovascular 2+ dorsalis pedis/posterior tibialis pulses. Psychiatric pleasant and cooperative. General Notes: T the lateral aspect of the left lower extremity there are 2 open wounds with granulation tissue and slough. Good edema control. Obvious o deformity to the foot from previous surgeries. No signs of surrounding soft tissue infection. Integumentary (Hair, Skin) Wound #4 status is Open. Original cause of wound was Gradually Appeared. The date acquired was: 04/19/2022. The wound has been in treatment 12 weeks. The wound is located on the Left,Proximal,Anterior Lower Leg. The wound measures 0.6cm length x 0.3cm width x 0.1cm depth; 0.141cm^2 area and 0.014cm^3 volume. There is Fat Layer (Subcutaneous Tissue) exposed. There is a medium amount of serosanguineous drainage noted. The wound margin is distinct with the outline attached to the wound base. There is large (67-100%) pink, pale granulation within the wound bed. There is no necrotic tissue within the wound bed. The periwound skin appearance did not exhibit: Callus, Crepitus, Excoriation, Induration, Rash, Scarring, Dry/Scaly, Maceration, Blane [place] Cyanosis, SEIRA, DLUGOSZ ([id number])  [id number] Page 9 of 12 Ecchymosis, Hemosiderin Staining, Mottled, Pallor, Rubor, Erythema. Periwound temperature was noted as No Abnormality. The periwound has tenderness on palpation. Wound #5 status is Open. Original cause of wound was Gradually Appeared. The date acquired was: 04/19/2022. The wound has been in treatment 12 weeks. The wound is located on the [facility] Lower Leg. The wound measures 4.9cm length x 2.9cm width x 0.1cm depth; 11.161cm^2 area and 1.116cm^3 volume. There is tendon and Fat Layer (Subcutaneous Tissue) exposed. There is a medium amount of serosanguineous drainage noted. The wound margin is distinct with the outline attached to the wound base. There is large (67-100%) pink, pale granulation within the wound bed. There is a small (1-33%) amount of necrotic tissue within the wound bed. The periwound skin appearance did not exhibit: Callus, Crepitus, Excoriation, Induration, Rash, Scarring, Dry/Scaly, Maceration, Atrophie Blanche, Cyanosis, Ecchymosis, Hemosiderin Staining, Mottled, Pallor, Rubor, Erythema. Periwound temperature was noted as No Abnormality. The periwound has tenderness on palpation. 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 I debrided nonviable tissue. Patient has been approved for PuraPly. She would like to proceed with placement. Instructions were given on how to care for this. This was placed in standard fashion to the large wound. T the more proximal wound I recommended o continuing Hydrofera Blue and blast X. Continue compression therapy daily. Procedures Wound #4 Pre-procedure diagnosis of Wound #4 is a Cellulitis located on the  Left,Proximal,Anterior Lower Leg . There was a Excisional Skin/Subcutaneous Tissue Debridement with a total area of 0.5 sq cm performed by Kalman Shan, Hailey. With the following instrument(s): Curette to remove Viable and Non-Viable tissue/material. Material removed includes Subcutaneous Tissue, Slough, Skin: Dermis, and Skin: Epidermis after achieving pain control using Lidocaine 4% T opical Solution. A time out was conducted at 11:50, prior to the start of the procedure. A Minimum amount of bleeding was controlled with Pressure. The procedure was tolerated well with a pain level of 0 throughout and a pain level of 0 following the procedure. Post Debridement Measurements: 0.6cm length x 0.3cm width x 0.1cm depth; 0.014cm^3 volume. Character of Wound/Ulcer Post Debridement is improved. Post procedure Diagnosis Wound #4: Same as Pre-Procedure Wound #5 Pre-procedure diagnosis of Wound #5 is a Cellulitis located on the Left,Distal,Anterior Lower Leg . There was a Excisional Skin/Subcutaneous Tissue Debridement with a total area of 14.21 sq cm performed by Kalman Shan, Hailey. With the following instrument(s): Curette to remove Viable and Non-Viable tissue/material. Material removed includes Subcutaneous Tissue, Slough, Skin: Dermis, and Skin: Epidermis after achieving pain control using Lidocaine 4% T opical Solution. A time out was conducted at 11:50, prior to the start of the procedure. A Minimum amount of bleeding was controlled with Pressure. The procedure was tolerated well with a pain level of 0 throughout and a pain level of 0 following the procedure. Post Debridement Measurements: 4.9cm length x 2.9cm width x 0.1cm depth; 1.116cm^3 volume. Character of Wound/Ulcer Post Debridement is improved. Post procedure Diagnosis Wound #5: Same as Pre-Procedure Pre-procedure diagnosis of Wound #5 is a Cellulitis located on the Left,Distal,Anterior Lower Leg. A skin graft procedure using a bioengineered  skin substitute/cellular or tissue based product was performed by Kalman Shan, Hailey with the following instrument(s): Forceps and Scissors. Puraply AM was applied and secured with Steri-Strips and adaptic. 12 sq cm of product was utilized and 0 sq cm was wasted. Post Application, Blastx under the Puraply AM by provider. was applied. A Time Out was conducted at 12:00, prior to the start of the procedure. The procedure was tolerated well with a pain level of 0 throughout and a pain level of 0 following the procedure. Post procedure Diagnosis Wound #5: Same as Pre-Procedure . Plan Follow-up Appointments: Return Appointment in 1 week. - Dr. Heber [place] 1200 09/01/2022 Thursday (already has this appt) Return Appointment in 2 weeks. - Dr. Heber [place] 1030 09/09/2022 Friday Other: - [employer] blastx and collagen. . Anesthetic: (In clinic) Topical Lidocaine 5% applied to wound bed Cellular or Tissue Based Products: Wound #5 Left,Distal,Anterior Lower Leg: Cellular or Tissue Based Product Type: - 08/18/2022 insurance ran organogenesis- 100% covered. 08/26/2022 #1 Puraply AM applied. Cellular or Tissue Based Product applied to wound bed, secured with steri-strips, cover with Adaptic or Mepitel. (Hailey NOT REMOVE). Bathing/ Shower/ Hygiene: May shower with protection but Hailey not get wound dressing(s) wet. Protect dressing(s) with water repellant cover (for example, large plastic bag) or a cast cover and may then take shower. Edema Control - Lymphedema /  SCD / Other: Elevate legs to the level of the heart or above for 30 minutes daily and/or when sitting for 3-4 times a day throughout the day. Avoid standing for long periods of time. [name], [name] ([id number]) [id number] Page 10 of 12 Exercise regularly Compression stocking or Garment 20-30 mm/Hg pressure to: - start wearing the juxtalite HD apply in the morning and remove at night left leg. Non Wound Condition: Protect area with: -  buttock area with AandD ointment daily WOUND #4: - Lower Leg Wound Laterality: Left, Anterior, Proximal Cleanser: Wound Cleanser (Generic) 1 x Per Day/30 Days Discharge Instructions: Cleanse the wound with wound cleanser prior to applying a clean dressing using gauze sponges, not tissue or cotton balls. Peri-Wound Care: Skin Prep (Generic) 1 x Per Day/30 Days Discharge Instructions: Use skin prep as directed Topical: blastx 1 x Per Day/30 Days Discharge Instructions: apply a layer to wound bed. Prim Dressing: Hydrofera Blue Ready Transfer Foam, 4x5 (in/in) 1 x Per Day/30 Days ary Discharge Instructions: Apply to wound bed as instructed Secondary Dressing: Zetuvit Plus Silicone Border Dressing 4x4 (in/in) (Generic) 1 x Per Day/30 Days Discharge Instructions: Apply silicone border over primary dressing as directed. Com pression Stockings: Circaid Juxta Lite Compression Wrap Compression Amount: 20-30 mmHg (left) Discharge Instructions: Apply Circaid Juxta Lite Compression Wrap daily as instructed. Apply first thing in the morning, remove at night before bed. WOUND #5: - Lower Leg Wound Laterality: Left, Anterior, Distal Cleanser: Wound Cleanser (Generic) 1 x Per Week/30 Days Discharge Instructions: Cleanse the wound with wound cleanser prior to applying a clean dressing using gauze sponges, not tissue or cotton balls. Topical: blastx 1 x Per Week/30 Days Discharge Instructions: applied under the puraply am. Prim Dressing: Puraply #1 1 x Per Week/30 Days ary Discharge Instructions: applied by provider. Prim Dressing: Adaptic and steri-strips 1 x Per Week/30 Days ary Discharge Instructions: Used to secure Puraply AM. Leave in place. Hailey not remove. Secondary Dressing: Woven Gauze Sponge, Non-Sterile 4x4 in 1 x Per Week/30 Days Discharge Instructions: Apply over primary dressing as directed. Secondary Dressing: Zetuvit Plus Silicone Border Dressing 5x5 (in/in) (Generic) 1 x Per Week/30  Days Discharge Instructions: Apply silicone border over primary dressing as directed. 1. In office sharp debridement 2. PuraPly placed in standard fashion 3. Juxta light compression daily 4. Continue Hydrofera Blue and blast X to the more proximal left leg wound 5. Follow-up in 1 week Electronic Signature(s) Signed: 08/26/2022 12:26:02 PM By: Kalman Shan Hailey Entered By: Kalman Shan on 08/26/2022 12:22:07 -------------------------------------------------------------------------------- HxROS Details Patient Name: Date of Service: Hailey Courser, [name] 08/26/2022 11:30 A M Medical Record Number: [id number] Patient Account Number: [id number] Date of Birth/Sex: Treating RN: September 18, 1945 (77 y.o. F) Primary Care Provider: Lorene Dy Other Clinician: Referring Provider: Treating Provider/Extender: [name] in Treatment: 12 Information Obtained From Patient Eyes Medical History: Negative for: Cataracts; Optic Neuritis Ear/Nose/Mouth/Throat Medical History: Negative for: Chronic sinus problems/congestion Hematologic/Lymphatic Medical History: Negative for: Anemia; Hemophilia; Human Immunodeficiency Virus; Lymphedema; Sickle Cell Disease MICHILLE, EAGLES ([id number]) [id number] Page 11 of 12 Respiratory Medical History: Positive for: Asthma; Sleep Apnea Negative for: Aspiration; Chronic Obstructive Pulmonary Disease (COPD); Pneumothorax; Tuberculosis Past Medical History Notes: 4L [place] 02 pulmonary fibrosis Cardiovascular Medical History: Positive for: Hypertension Negative for: Angina; Arrhythmia; Congestive Heart Failure; Coronary Artery Disease; Deep Vein Thrombosis; Hypotension; Myocardial Infarction; Peripheral Arterial Disease; Peripheral Venous Disease; Phlebitis; Vasculitis Gastrointestinal Medical History: Positive for: Colitis Negative for: Cirrhosis ; Crohns; Hepatitis  A; Hepatitis B; Hepatitis  C Endocrine Medical History: Negative for: Type I Diabetes; Type II Diabetes Immunological Medical History: Negative for: Lupus Erythematosus; Raynauds; Scleroderma Integumentary (Skin) Medical History: Negative for: History of Burn Musculoskeletal Medical History: Positive for: Rheumatoid Arthritis; Osteoarthritis Negative for: Gout; Osteomyelitis Neurologic Medical History: Negative for: Dementia; Neuropathy; Quadriplegia; Paraplegia; Seizure Disorder Oncologic Medical History: Negative for: Received Chemotherapy; Received Radiation Psychiatric Medical History: Negative for: Anorexia/bulimia; Confinement Anxiety Immunizations Pneumococcal Vaccine: Received Pneumococcal Vaccination: Yes Received Pneumococcal Vaccination On or After 60th Birthday: Yes Implantable Devices No devices added Hospitalization / Surgery History Type of Hospitalization/Surgery surgery toe right hip replacement 12/2018 heart cath 12/17/2021 Family and Social History Cancer: Yes - Maternal Grandparents; Diabetes: Yes - Siblings; Heart Disease: Yes - Maternal Grandparents,Paternal Grandparents,Mother,Father,Siblings; Hereditary Spherocytosis: No; Hypertension: Yes - Mother,Father,Siblings; Kidney Disease: No; Lung Disease: No; Seizures: No; Stroke: Yes - Paternal Grandparents; Thyroid Problems: No; Tuberculosis: No; Never smoker; Marital Status - Married; Alcohol Use: Moderate; Drug Use: No History; Caffeine Use: Moderate; Financial Concerns: No; Food, Clothing or Shelter Needs: No; Support System Lacking: No; Transportation Concerns: No CANDIAS, TACKITT ([id number]) [id number] Page 12 of 12 Electronic Signature(s) Signed: 08/26/2022 12:26:02 PM By: Kalman Shan Hailey Entered By: Kalman Shan on 08/26/2022 12:19:02 -------------------------------------------------------------------------------- SuperBill Details Patient Name: Date of Service: [place].  08/26/2022 Medical Record Number: [id number] Patient Account Number: [id number] Date of Birth/Sex: Treating RN: 1945/07/27 (77 y.o. [name], [name] Primary Care Provider: Lorene Dy Other Clinician: Referring Provider: Treating Provider/Extender: [name] in Treatment: 12 Diagnosis Coding ICD-10 Codes Code Description [phone] Non-pressure chronic ulcer of other part of left lower leg with other specified severity I87.312 Chronic venous hypertension (idiopathic) with ulcer of left lower extremity J84.9 Interstitial pulmonary disease, unspecified M06.9 Rheumatoid arthritis, unspecified Facility Procedures : CPT4 Code: [id number] Description: [id number] PuraPly AM 3X4 (12sq. cm) enter 12qty Modifier: Quantity: 12 : CPT4 Code: [id number] Description: [id number] - SKIN SUB GRAFT TRNK/ARM/LEG ICD-10 Diagnosis Description I87.312 Chronic venous hypertension (idiopathic) with ulcer of left lower extremity L97.828 Non-pressure chronic ulcer of other part of left lower leg with other specified s Modifier: everity Quantity: 1 : CPT4 Code: [id number] Description: [id number] - DEB SUBQ TISSUE 20 SQ CM/< ICD-10 Diagnosis Description L97.828 Non-pressure chronic ulcer of other part of left lower leg with other specified s Modifier: 59 everity Quantity: 1 Physician Procedures : CPT4 Code Description Modifier [id number] - WC PHYS SKIN SUB GRAFT TRNK/ARM/LEG ICD-10 Diagnosis Description I87.312 Chronic venous hypertension (idiopathic) with ulcer of left lower extremity L97.828 Non-pressure chronic ulcer of other part of left  lower leg with other specified severity Quantity: 1 : [id number] - WC PHYS SUBQ TISS 20 SQ CM 59 ICD-10 Diagnosis Description L97.828 Non-pressure chronic ulcer of other part of left lower leg with other specified severity Quantity: 1 Electronic Signature(s) Signed: 08/26/2022 12:26:02 PM By: Kalman Shan Hailey Entered By: Kalman Shan on 08/26/2022  12:22:22

## 2022-08-28 ENCOUNTER — Other Ambulatory Visit: Payer: Self-pay | Admitting: Internal Medicine

## 2022-08-31 NOTE — Progress Notes (Signed)
[name], [name] ([id number]) [id number] Page 1 of 9 Visit Report for 08/26/2022 Arrival Information Details Patient Name: Date of Service: [place]. 08/26/2022 11:30 A M Medical Record Number: [id number] Patient Account Number: [id number] Date of Birth/Sex: Treating RN: 01-14-1946 (77 y.o. F) Primary Care [name]: [name] Other Clinician: Referring [name]: Treating [name]/Extender: [name] in Treatment: 12 Visit Information History Since Last Visit Added or deleted any medications: No Patient Arrived: Wheel Chair Any new allergies or adverse reactions: No Arrival Time: 11:30 Had a fall or experienced change in No Accompanied By: husband activities of daily living that may affect Transfer Assistance: None risk of falls: Patient Identification Verified: Yes Signs or symptoms of abuse/neglect since last visito No Secondary Verification Process Completed: Yes Hospitalized since last visit: No Patient Requires Transmission-Based Precautions: No Implantable device outside of the clinic excluding No Patient Has Alerts: Yes cellular tissue based products placed in the center Patient Alerts: ABI's: L: N/C 11/23 since last visit: Has Dressing in Place as Prescribed: Yes Pain Present Now: No Electronic Signature(s) Signed: 08/26/2022 1:13:01 PM By: [name] Entered By: [name] on 08/26/2022 11:31:15 -------------------------------------------------------------------------------- Encounter Discharge Information Details Patient Name: Date of Service: Hailey Courser, DO Hailey THY S. 08/26/2022 11:30 A M Medical Record Number: [id number] Patient Account Number: [id number] Date of Birth/Sex: Treating RN: 07-31-1945 (77 y.o. [name] Primary Care [name]: [name] Other Clinician: Referring [name]: Treating [name]/Extender: [name] in Treatment: 12 Encounter  Discharge Information Items Post Procedure Vitals Discharge Condition: Stable Temperature (F): 98.3 Ambulatory Status: Wheelchair Pulse (bpm): 62 Discharge Destination: Home Respiratory Rate (breaths/min): 20 Transportation: Private Auto Blood Pressure (mmHg): 119/68 Accompanied By: husband Schedule Follow-up Appointment: Yes Clinical Summary of Care: Electronic Signature(s) Signed: 08/26/2022 5:22:46 PM By: Hailey Pilling RN, BSN Entered By: [name] on 08/26/2022 12:11:55 [name] ([id number]) [id number] Page 2 of 9 -------------------------------------------------------------------------------- Lower Extremity Assessment Details Patient Name: Date of Service: [name]. 08/26/2022 11:30 A M Medical Record Number: [id number] Patient Account Number: [id number] Date of Birth/Sex: Treating RN: [birth year] (77 y.o. F) Primary Care Fama Muenchow: [name] Other Clinician: Referring [name]: Treating [name]/Extender: [name] in Treatment: 12 Edema Assessment Assessed: [name]: No] [name]: No] Edema: [Left: Ye] [Right: s] Calf Left: Right: Point of Measurement: 29 cm From Medial Instep 31.5 cm Ankle Left: Right: Point of Measurement: 9 cm From Medial Instep 24 cm Electronic Signature(s) Signed: 08/26/2022 1:13:01 PM By: [name] Entered By: [name] on 08/26/2022 11:33:20 -------------------------------------------------------------------------------- Multi Wound Chart Details Patient Name: Date of Service: Hailey Courser, DO Hailey THY S. 08/26/2022 11:30 A M Medical Record Number: [id number] Patient Account Number: [id number] Date of Birth/Sex: Treating RN: [birth year] (77 y.o. F) Primary Care Mohannad Olivero: [name] Other Clinician: Referring Nikia Mangino: Treating [name]/Extender: [name] in Treatment: 12 Vital Signs Height(in): 61 Pulse(bpm): 16 Weight(lbs):  186 Blood Pressure(mmHg): 119/68 Body Mass Index(BMI): 35.1 Temperature(F): 98.3 Respiratory Rate(breaths/min): 20 [4:Photos:] [N/A:N/A] Left, Proximal, Anterior Lower Leg Left, Distal, Anterior Lower Leg N/A Wound Location: Gradually Appeared Gradually Appeared N/A Wounding Event: Cellulitis Cellulitis N/A Primary Etiology: Asthma, Sleep Apnea, Hypertension, Asthma, Sleep Apnea, Hypertension, N/A Comorbid History: Colitis, Rheumatoid Arthritis, Colitis, Rheumatoid Arthritis, Osteoarthritis Osteoarthritis [name], [name] ([id number]) [id number] Page 3 of 9 04/19/2022 04/19/2022 N/A Date Acquired: 12 12 N/A Weeks of Treatment: Open Open N/A Wound Status: No No N/A Wound Recurrence: 0.6x0.3x0.1 4.9x2.9x0.1  N/A Measurements L x W x D (cm) 0.141 11.161 N/A A (cm) : rea 0.014 1.116 N/A Volume (cm) : 96.70% 28.20% N/A % Reduction in Area: 98.90% 28.20% N/A % Reduction in Volume: Full Thickness With Exposed Support Full Thickness With Exposed Support N/A Classification: Structures Structures Medium Medium N/A Exudate A mount: Serosanguineous Serosanguineous N/A Exudate Type: red, brown red, brown N/A Exudate Color: Distinct, outline attached Distinct, outline attached N/A Wound Margin: Large (67-100%) Large (67-100%) N/A Granulation A mount: Pink, Pale Pink, Pale N/A Granulation Quality: None Present (0%) Small (1-33%) N/A Necrotic A mount: Fat Layer (Subcutaneous Tissue): Yes Fat Layer (Subcutaneous Tissue): Yes N/A Exposed Structures: Fascia: No Tendon: Yes Tendon: No Fascia: No Muscle: No Muscle: No Joint: No Joint: No Bone: No Bone: No Large (67-100%) Small (1-33%) N/A Epithelialization: Debridement - Excisional Debridement - Excisional N/A Debridement: Pre-procedure Verification/Time Out 11:50 11:50 N/A Taken: Lidocaine 4% Topical Solution Lidocaine 4% Topical Solution N/A Pain Control: Subcutaneous, Slough  Subcutaneous, Slough N/A Tissue Debrided: Skin/Subcutaneous Tissue Skin/Subcutaneous Tissue N/A Level: 0.5 14.21 N/A Debridement A (sq cm): rea Curette Curette N/A Instrument: Minimum Minimum N/A Bleeding: Pressure Pressure N/A Hemostasis A chieved: 0 0 N/A Procedural Pain: 0 0 N/A Post Procedural Pain: Procedure was tolerated well Procedure was tolerated well N/A Debridement Treatment Response: 0.6x0.3x0.1 4.9x2.9x0.1 N/A Post Debridement Measurements L x W x D (cm) 0.014 1.116 N/A Post Debridement Volume: (cm) Excoriation: No Excoriation: No N/A Periwound Skin Texture: Induration: No Induration: No Callus: No Callus: No Crepitus: No Crepitus: No Rash: No Rash: No Scarring: No Scarring: No Maceration: No Maceration: No N/A Periwound Skin Moisture: Dry/Scaly: No Dry/Scaly: No Atrophie Blanche: No Atrophie Blanche: No N/A Periwound Skin Color: Cyanosis: No Cyanosis: No Ecchymosis: No Ecchymosis: No Erythema: No Erythema: No Hemosiderin Staining: No Hemosiderin Staining: No Mottled: No Mottled: No Pallor: No Pallor: No Rubor: No Rubor: No No Abnormality No Abnormality N/A Temperature: Yes Yes N/A Tenderness on Palpation: Debridement Cellular or Tissue Based Product N/A Procedures Performed: Debridement Treatment Notes Wound #4 (Lower Leg) Wound Laterality: Left, Anterior, Proximal Cleanser Wound Cleanser Discharge Instruction: Cleanse the wound with wound cleanser prior to applying a clean dressing using gauze sponges, not tissue or cotton balls. Peri-Wound Care Skin Prep Discharge Instruction: Use skin prep as directed Topical blastx Discharge Instruction: apply a layer to wound bed. Primary Dressing Hydrofera [name] Ready Transfer Foam, 4x5 (in/in) Discharge Instruction: Apply to wound bed as instructed Secondary Dressing TYGER, LIFTON ([id number]) [id number] Page 4 of 9 Zetuvit Plus Silicone Border Dressing  4x4 (in/in) Discharge Instruction: Apply silicone border over primary dressing as directed. Secured With Compression Wrap Compression Stockings Circaid Juxta Lite Compression Wrap Quantity: 1 Left Leg Compression Amount: 20-30 mmHg Discharge Instruction: Apply Circaid Juxta Lite Compression Wrap daily as instructed. Apply first thing in the morning, remove at night before bed. Add-Ons Wound #5 (Lower Leg) Wound Laterality: Left, Anterior, Distal Cleanser Wound Cleanser Discharge Instruction: Cleanse the wound with wound cleanser prior to applying a clean dressing using gauze sponges, not tissue or cotton balls. Peri-Wound Care Topical blastx Discharge Instruction: applied under the puraply am. Primary Dressing Puraply #1 Discharge Instruction: applied by Stellar Gensel. Adaptic and steri-strips Discharge Instruction: Used to secure Puraply AM. Leave in place. Do not remove. Secondary Dressing Woven Gauze Sponge, Non-Sterile 4x4 in Discharge Instruction: Apply over primary dressing as directed. Zetuvit Plus Silicone Border Dressing 5x5 (in/in) Discharge Instruction: Apply silicone border over primary dressing as directed. Secured With Compression Wrap Compression Stockings Add-Ons  Electronic Signature(s) Signed: 08/26/2022 12:26:02 PM By: Kalman Shan DO Entered By: Kalman Shan on 08/26/2022 12:18:12 -------------------------------------------------------------------------------- Multi-Disciplinary Care Plan Details Patient Name: Date of Service: [place]. 08/26/2022 11:30 A M Medical Record Number: [id number] Patient Account Number: [id number] Date of Birth/Sex: Treating RN: June 19, 1946 (77 y.o. [name], [name] Primary Care Domenica Weightman: [name] Other Clinician: Referring Nyaja [place]: Treating Vincent Streater/Extender: [name] in Treatment: 12 Active Inactive Pain, Acute or Chronic [name], LAFRAMBOISE ([id number])  [phone].pdf Page 5 of 9 Nursing Diagnoses: Pain, acute or chronic: actual or potential Potential alteration in comfort, pain Goals: Patient will verbalize adequate pain control and receive pain control interventions during procedures as needed Date Initiated: 06/03/2022 Target Resolution Date: 10/14/2022 Goal Status: Active Patient/caregiver will verbalize comfort level met Date Initiated: 06/03/2022 Target Resolution Date: 10/14/2022 Goal Status: Active Interventions: Encourage patient to take pain medications as prescribed Provide education on pain management Treatment Activities: Administer pain control measures as ordered : 06/03/2022 Notes: Electronic Signature(s) Signed: 08/26/2022 5:22:46 PM By: Hailey Pilling RN, BSN Entered By: [name] on 08/26/2022 12:10:00 -------------------------------------------------------------------------------- Pain Assessment Details Patient Name: Date of Service: [place]. 08/26/2022 11:30 A M Medical Record Number: [id number] Patient Account Number: [id number] Date of Birth/Sex: Treating RN: 11/27/1945 (77 y.o. F) Primary Care Nelta Caudill: [name] Other Clinician: Referring Tabby Beaston: Treating Chrysa Rampy/Extender: [name] in Treatment: 12 Active Problems Location of Pain Severity and Description of Pain Patient Has Paino No Site Locations Pain Management and Medication Current Pain Management: Electronic Signature(s) Signed: 08/26/2022 1:13:01 PM By: [name] Entered By: [name] on 08/26/2022 11:31:51 [name] ([id number]) [id number] Page 6 of 9 -------------------------------------------------------------------------------- Patient/Caregiver Education Details Patient Name: Date of Service: [place] 2/9/2024andnbsp11:30 [place] Record Number: [id number] Patient Account Number: [id number] Date of  Birth/Gender: Treating RN: 1946/04/02 (77 y.o. [name] Primary Care Physician: [name] Other Clinician: Referring Physician: Treating Physician/Extender: [name] in Treatment: 12 Education Assessment Education Provided To: Patient Education Topics Provided Wound/Skin Impairment: Handouts: Caring for Your Ulcer Methods: Explain/Verbal Responses: Reinforcements needed Electronic Signature(s) Signed: 08/26/2022 5:22:46 PM By: Hailey Pilling RN, BSN Entered By: [name] on 08/26/2022 12:10:21 -------------------------------------------------------------------------------- Wound Assessment Details Patient Name: Date of Service: [place]. 08/26/2022 11:30 A M Medical Record Number: [id number] Patient Account Number: [id number] Date of Birth/Sex: Treating RN: 01-Dec-1945 (77 y.o. F) Primary Care Junia Nygren: [name] Other Clinician: Referring Lomax Poehler: Treating Kriss Ishler/Extender: [name] in Treatment: 12 Wound Status Wound Number: 4 Primary Cellulitis Etiology: Wound Location: Left, Proximal, Anterior Lower Leg Wound Open Wounding Event: Gradually Appeared Status: Date Acquired: 04/19/2022 Comorbid Asthma, Sleep Apnea, Hypertension, Colitis, Rheumatoid Weeks Of Treatment: 12 History: Arthritis, Osteoarthritis Clustered Wound: No Photos Wound Measurements SAMMIE, AYAD ([id number]) Length: (cm) 0.6 Width: (cm) 0.3 Depth: (cm) 0.1 Area: (cm) 0.141 Volume: (cm) 0.014 [id number] Page 7 of 9 % Reduction in Area: 96.7% % Reduction in Volume: 98.9% Epithelialization: Large (67-100%) Wound Description Classification: Full Thickness With Exposed Support Structures Wound Margin: Distinct, outline attached Exudate Amount: Medium Exudate Type: Serosanguineous Exudate Color: red, brown Foul Odor After Cleansing: No Slough/Fibrino Yes Wound Bed Granulation  Amount: Large (67-100%) Exposed Structure Granulation Quality: Pink, Pale Fascia Exposed: No Necrotic Amount: None Present (0%) Fat Layer (Subcutaneous Tissue) Exposed: Yes Tendon Exposed: No Muscle Exposed: No Joint Exposed: No Bone Exposed: No  Periwound Skin Texture Texture Color No Abnormalities Noted: No No Abnormalities Noted: No Callus: No Atrophie Blanche: No Crepitus: No Cyanosis: No Excoriation: No Ecchymosis: No Induration: No Erythema: No Rash: No Hemosiderin Staining: No Scarring: No Mottled: No Pallor: No Moisture Rubor: No No Abnormalities Noted: No Dry / Scaly: No Temperature / Pain Maceration: No Temperature: No Abnormality Tenderness on Palpation: Yes Treatment Notes Wound #4 (Lower Leg) Wound Laterality: Left, Anterior, Proximal Cleanser Wound Cleanser Discharge Instruction: Cleanse the wound with wound cleanser prior to applying a clean dressing using gauze sponges, not tissue or cotton balls. Peri-Wound Care Skin Prep Discharge Instruction: Use skin prep as directed Topical blastx Discharge Instruction: apply a layer to wound bed. Primary Dressing Hydrofera [name] Ready Transfer Foam, 4x5 (in/in) Discharge Instruction: Apply to wound bed as instructed Secondary Dressing Zetuvit Plus Silicone Border Dressing 4x4 (in/in) Discharge Instruction: Apply silicone border over primary dressing as directed. Secured With Compression Wrap Compression Stockings Circaid Juxta Lite Compression Wrap Quantity: 1 Left Leg Compression Amount: 20-30 mmHg Discharge Instruction: Apply Circaid Juxta Lite Compression Wrap daily as instructed. Apply first thing in the morning, remove at night before bed. Add-Ons Electronic Signature(s) SKYELYNN, REHMAN ([id number]) [id number] Page 8 of 9 Signed: 08/31/2022 9:51:04 AM By: Sandre Kitty Entered By: Sandre Kitty on 08/26/2022  11:34:40 -------------------------------------------------------------------------------- Wound Assessment Details Patient Name: Date of Service: Hailey Courser, DO Hailey THY S. 08/26/2022 11:30 A M Medical Record Number: [id number] Patient Account Number: [id number] Date of Birth/Sex: Treating RN: 1946-06-26 (77 y.o. F) Primary Care Shenae Bonanno: [name] Other Clinician: Referring Geanette Buonocore: Treating Kasiah Manka/Extender: [name] in Treatment: 12 Wound Status Wound Number: 5 Primary Cellulitis Etiology: Wound Location: Left, Distal, Anterior Lower Leg Wound Open Wounding Event: Gradually Appeared Status: Date Acquired: 04/19/2022 Comorbid Asthma, Sleep Apnea, Hypertension, Colitis, Rheumatoid Weeks Of Treatment: 12 History: Arthritis, Osteoarthritis Clustered Wound: No Photos Wound Measurements Length: (cm) 4.9 Width: (cm) 2.9 Depth: (cm) 0.1 Area: (cm) 11.161 Volume: (cm) 1.116 % Reduction in Area: 28.2% % Reduction in Volume: 28.2% Epithelialization: Small (1-33%) Wound Description Classification: Full Thickness With Exposed Suppor Wound Margin: Distinct, outline attached Exudate Amount: Medium Exudate Type: Serosanguineous Exudate Color: red, brown t Structures Foul Odor After Cleansing: No Slough/Fibrino Yes Wound Bed Granulation Amount: Large (67-100%) Exposed Structure Granulation Quality: Pink, Pale Fascia Exposed: No Necrotic Amount: Small (1-33%) Fat Layer (Subcutaneous Tissue) Exposed: Yes Tendon Exposed: Yes Muscle Exposed: No Joint Exposed: No Bone Exposed: No Periwound Skin Texture Texture Color No Abnormalities Noted: No No Abnormalities Noted: No Callus: No Atrophie Blanche: No Crepitus: No Cyanosis: No Excoriation: No Ecchymosis: No Induration: No Erythema: No Rash: No Hemosiderin Staining: No Scarring: No Mottled: No Pallor: No KADI, SATTERTHWAITE ([id number]) [id number] Page 9 of 9 Pallor:  No Moisture Rubor: No No Abnormalities Noted: No Dry / Scaly: No Temperature / Pain Maceration: No Temperature: No Abnormality Tenderness on Palpation: Yes Treatment Notes Wound #5 (Lower Leg) Wound Laterality: Left, Anterior, Distal Cleanser Wound Cleanser Discharge Instruction: Cleanse the wound with wound cleanser prior to applying a clean dressing using gauze sponges, not tissue or cotton balls. Peri-Wound Care Topical blastx Discharge Instruction: applied under the puraply am. Primary Dressing Puraply #1 Discharge Instruction: applied by Shavonne Ambroise. Adaptic and steri-strips Discharge Instruction: Used to secure Puraply AM. Leave in place. Do not remove. Secondary Dressing Woven Gauze Sponge, Non-Sterile 4x4 in Discharge Instruction: Apply over primary dressing as directed. Zetuvit Plus Silicone Border Dressing 5x5 (in/in) Discharge Instruction: Apply silicone border over  primary dressing as directed. Secured With Compression Wrap Compression Stockings Environmental education officer) Signed: 08/31/2022 9:51:04 AM By: Sandre Kitty Entered By: Sandre Kitty on 08/26/2022 11:35:11 -------------------------------------------------------------------------------- Vitals Details Patient Name: Date of Service: [place]. 08/26/2022 11:30 A M Medical Record Number: [id number] Patient Account Number: [id number] Date of Birth/Sex: Treating RN: Jun 16, 1946 (77 y.o. F) Primary Care Mahsa Hanser: [name] Other Clinician: Referring Erlean Mealor: Treating Arvil Utz/Extender: [name] in Treatment: 12 Vital Signs Time Taken: 11:31 Temperature (F): 98.3 Height (in): 61 Pulse (bpm): 62 Weight (lbs): 186 Respiratory Rate (breaths/min): 20 Body Mass Index (BMI): 35.1 Blood Pressure (mmHg): 119/68 Reference Range: 80 - 120 mg / dl Electronic Signature(s) Signed: 08/26/2022 1:13:01 PM By: [name] Entered By: [name] on  08/26/2022 11:31:39

## 2022-09-01 ENCOUNTER — Encounter (HOSPITAL_BASED_OUTPATIENT_CLINIC_OR_DEPARTMENT_OTHER): Payer: Medicare Other | Admitting: Internal Medicine

## 2022-09-01 DIAGNOSIS — L97828 Non-pressure chronic ulcer of other part of left lower leg with other specified severity: Secondary | ICD-10-CM | POA: Diagnosis not present

## 2022-09-01 DIAGNOSIS — I87312 Chronic venous hypertension (idiopathic) with ulcer of left lower extremity: Secondary | ICD-10-CM | POA: Diagnosis not present

## 2022-09-02 NOTE — Progress Notes (Signed)
ROMANDA, SUM ([id number]) [id number] Page 1 of 13 Visit Report for 09/01/2022 Chief Complaint Document Details Patient Name: Date of Service: Hailey Courser, DO [name] 09/01/2022 12:00 PM Medical Record Number: [id number] Patient Account Number: [id number] Date of Birth/Sex: Treating RN: September 28, 1945 (77 y.o. F) Primary Care Provider: Lorene Dy Other Clinician: Referring Provider: Treating Provider/Extender: [name] in Treatment: 12 Information Obtained from: Patient Chief Complaint 06/03/2022; Left lower extremity wounds Electronic Signature(s) Signed: 09/01/2022 1:41:02 PM By: Kalman Shan DO Entered By: Kalman Shan on 09/01/2022 13:16:24 -------------------------------------------------------------------------------- Cellular or Tissue Based Product Details Patient Name: Date of Service: [name]. 09/01/2022 12:00 PM Medical Record Number: [id number] Patient Account Number: [id number] Date of Birth/Sex: Treating RN: 1945-09-22 (77 y.o. [name], [name] Primary Care Provider: Lorene Dy Other Clinician: Referring Provider: Treating Provider/Extender: [name] in Treatment: 12 Cellular or Tissue Based Product Type Wound #5 Left,Distal,Anterior Lower Leg Applied to: Performed By: Physician Kalman Shan, DO Cellular or Tissue Based Product Type: Puraply AM Level of Consciousness (Pre-procedure): Awake and Alert Pre-procedure Verification/Time Out Yes - 13:00 Taken: Location: trunk / arms / legs Wound Size (sq cm): 13.44 Product Size (sq cm): 8 Waste Size (sq cm): 0 Amount of Product Applied (sq cm): 8 Instrument Used: Forceps, Scissors Lot #: [id number] Order #: 2 Expiration Date: 07/18/2024 Fenestrated: No Reconstituted: Yes Solution Type: normal saline Solution Amount: 56m Lot #: [id number] Expiration Date: 12/15/2024 Secured: Yes Secured With:  Steri-Strips, adaptic Dressing Applied: Yes Primary Dressing: ENDOFORM Procedural Pain: 0 Post Procedural Pain: 0 Response to Treatment: Procedure was tolerated well Level of Consciousness (Post- Awake and A814 [address] Station StreetCVALINA, HOEPPNER([id number] Page 2 of 13 procedure): Post Procedure Diagnosis Same as Pre-procedure Notes Endoform used to the area of wound not covered by puraply am. Electronic Signature(s) Signed: 09/01/2022 1:41:02 PM By: HKalman ShanDO Signed: 09/01/2022 4:32:13 PM By: DDeon PillingRN, BSN Entered By: DDeon Pillingon 09/01/2022 13:07:44 -------------------------------------------------------------------------------- Debridement Details Patient Name: Date of Service: [name] 09/01/2022 12:00 PM Medical Record Number: [id number] Account Number: [id number] of Birth/Sex: Treating RN: 905-10-1945(77y.o. FHelene [name] BMeta.RedingPrimary Care Provider: RLorene DyOther Clinician: Referring Provider: Treating Provider/Extender: HKrista Bluein Treatment: 12 Debridement Performed for Assessment: Wound #4 Left,Proximal,Anterior Lower Leg Performed By: Physician HKalman Shan DO Debridement Type: Debridement Level of Consciousness (Pre-procedure): Awake and Alert Pre-procedure Verification/Time Out Yes - 12:50 Taken: Start Time: 12:51 Pain Control: Lidocaine 4% T opical Solution T Area Debrided (L x W): otal 1 (cm) x 1 (cm) = 1 (cm) Tissue and other material debrided: Viable, Non-Viable, Slough, Subcutaneous, Skin: Dermis , Skin: Epidermis, Slough Level: Skin/Subcutaneous Tissue Debridement Description: Excisional Instrument: Curette Bleeding: Minimum Hemostasis Achieved: Pressure End Time: 12:58 Procedural Pain: 0 Post Procedural Pain: 0 Response to Treatment: Procedure was tolerated well Level of Consciousness (Post- Awake and Alert procedure): Post Debridement  Measurements of Total Wound Length: (cm) 0.2 Width: (cm) 0.2 Depth: (cm) 0.1 Volume: (cm) 0.003 Character of Wound/Ulcer Post Debridement: Improved Post Procedure Diagnosis Same as Pre-procedure Electronic Signature(s) Signed: 09/01/2022 1:41:02 PM By: HKalman ShanDO Signed: 09/01/2022 4:32:13 PM By: DDeon PillingRN, BSN Entered By: DDeon Pillingon 09/01/2022 13:02:25 CBarrie [name]([id number] Page 3 of 13 -------------------------------------------------------------------------------- Debridement Details Patient Name: Date of Service: [name] 09/01/2022 12:00 PM Medical Record Number: [id number]  Account Number: [id number] Date of Birth/Sex: Treating RN: 10-24-1945 (77 y.o. [name], [name] Primary Care Provider: Lorene Dy Other Clinician: Referring Provider: Treating Provider/Extender: [name] in Treatment: 12 Debridement Performed for Assessment: Wound #5 Left,Distal,Anterior Lower Leg Performed By: Physician Kalman Shan, DO Debridement Type: Debridement Level of Consciousness (Pre-procedure): Awake and Alert Pre-procedure Verification/Time Out Yes - 12:50 Taken: Start Time: 12:51 Pain Control: Lidocaine 4% T opical Solution T Area Debrided (L x W): otal 4.8 (cm) x 2.8 (cm) = 13.44 (cm) Tissue and other material debrided: Viable, Non-Viable, Slough, Subcutaneous, Skin: Dermis , Skin: Epidermis, Slough Level: Skin/Subcutaneous Tissue Debridement Description: Excisional Instrument: Curette Bleeding: Minimum Hemostasis Achieved: Pressure End Time: 12:58 Procedural Pain: 0 Post Procedural Pain: 0 Response to Treatment: Procedure was tolerated well Level of Consciousness (Post- Awake and Alert procedure): Post Debridement Measurements of Total Wound Length: (cm) 4.8 Width: (cm) 2.8 Depth: (cm) 0.1 Volume: (cm) 1.056 Character of Wound/Ulcer Post Debridement:  Improved Post Procedure Diagnosis Same as Pre-procedure Electronic Signature(s) Signed: 09/01/2022 1:41:02 PM By: Kalman Shan DO Signed: 09/01/2022 4:32:13 PM By: Deon Pilling RN, BSN Entered By: Deon Pilling on 09/01/2022 13:04:22 -------------------------------------------------------------------------------- HPI Details Patient Name: Date of Service: Hailey Courser, DO [name] 09/01/2022 12:00 PM Medical Record Number: [id number] Patient Account Number: [id number] Date of Birth/Sex: Treating RN: 06-17-1946 (77 y.o. F) Primary Care Provider: Lorene Dy Other Clinician: Referring Provider: Treating Provider/Extender: [name] in Treatment: 12 History of Present Illness HPI Description: Patient presents today for initial evaluation and our clinic as a referral from the [facility] health system Department of orthopedics for evaluation and treatment of the wound to his his of the left foot at the base of the great toe. Currently the good news is the patient really does not have any significant pain which is excellent. She has been seen by Dr. Linus Salmons at [facility]: infectious disease clinic that is the regional Center for infectious disease. Subsequently the patient was cultured and positive for methicillin sensitive staph aureus. She has been on antibiotics including Cipro prior to surgery as will several days following surgery. She then had clindamycin which was changed to doxycycline. She took this for 10 days. Subsequently the pain had improved and there was no longer any possible draining from the wound according to the notes. The patient sedentary rate as well as C-reactive protein have returned to normal ranges. Currently per Dr. Henreitta Leber assessment there was one dehiscence with no sign of osteomyelitis. He therefore discontinue the antibiotics with the completion of the last three days Of doxycycline. Other than that he just set her for a follow-up as  needed. The patient does not have diabetes and is not a current smoker. At this time her treatment that was recommended by the surgeon was applying a Hydrocolloid dressing. Based on what I'm seeing at this point the patient actually has some Slough noted over the surface of the wound there really does not appear to be evidence of infection though I do think she does require some sharp debridement to clear away the slough and help with appropriate wound healing. She does have areas of granulation buds noted. She may be JIMESHA, SCHUT ([id number]) [id number] Page 4 of 13 a candidate for a skin substitute as well. We will see how things do over the next period of time until follow-up. No fevers, chills, nausea, or vomiting noted at this time. 06/13/18 on evaluation today patient actually appears to be doing  better in regard to her toe ulcer. She's been using the Prisma on this region and that seems to have done extremely well for her. Fortunately there does not appear to be evidence of infection at this time which is good news. She did see her surgeon they were extremely pleased with the overall appearance of her wound. 06/27/18 on evaluation today patient actually appears to be doing very well in regard to her foot ulcer. She has been tolerating the dressing changes without complication which is great news. She did see her podiatrist they felt like everything is looking very nice as well and will please. 07/05/2018 surgical wound on the left foot. She appears to be doing well. Still surface debrided to remove however in general post debridement the wound bed looks quite healthy it is come down significantly in terms of dimensions using silver collagen. The patient describes pain when her foot is elevated either in bed at night [sometimes keeps her awake] or when is propped up on a foot rest. She basically takes analgesics for this. She does not really describe claudication  with activity however her activity is very limited by interstitial lung disease. Her ABIs initially in this clinic were noncompressible. She is not a diabetic 07/20/2018; surgical wound on the left foot. Wound actually looks quite a bit better than I remember seeing this. She is still describing pain with her leg elevated at night that she does not get when the wound is supine. She does not really describe claudication but she is very limited by her pulmonary status. We have been using silver collagen. 1/17; the patient has been followed up by orthopedics and discharge. Her arterial studies were actually quite good and should not be contributing to any pain. Slight reduction in ABIs but otherwise normal. Her wound is closed. She saw Dr. Linus Salmons of infectious disease surrounding the surgery. By review of our notes she was not felt to have osteomyelitis. 06/03/2022 Ms. Finnegan Litzinger is a 77 year old female with a past medical history of interstitial lung disease on chronic oxygen via nasal cannula, rheumatoid arthritis, chronic diastolic heart failure and OSA that presents to the clinic for a 1 to 108-monthhistory of nonhealing ulcer to the left lower extremity. On 04/20/2022 she was admitted to the hospital for left lower extremity cellulitis. She required 8 days of IV vancomycin and Unasyn followed by a 14-day course of Augmentin and doxycycline. She has been using Silvadene cream to the area. She does not use compression therapy but does own compression stockings. She currently denies signs of infection. 11/27; patient presents for follow-up she is using Medihoney and Hydrofera Blue to the wound beds. She has been using her Tubigrip. She has no issues or complaints today. 12/4; patient presents for follow-up. She continues to use Medihoney and Hydrofera Blue to the wound beds. Several attempts have been made to schedule ABIs with TBI's. We gave patient the number today to call to have these scheduled. She  has been using Tubigrip. She has no issues or complaints today. 12/18; she continues to use Medihoney and Hydrofera Blue in general the surface of the wound is looking somewhat better this week and measurements are slightly smaller. She did have her arterial studies done these were noncompressible bilaterally however TBI on the right of 0.69 on the left 0.62. Waveforms on the right were triphasic on the left biphasic to triphasic. This does not suggest significant arterial disease to make healing wounds at this location difficult. 12/29; patient presents  for follow-up. She has been using Medihoney and Hydrofera Blue to the wound bed. She has been using Tubigrip. 1/5; patient presents for follow-up. She is been using Medihoney and Hydrofera Blue to the wound bed along with Tubigrip. The Tubigrip was doubled at last clinic visit. There is been improvement in wound healing. 1/12; patient presents for follow-up. She has been using Medihoney and Hydrofera Blue to the wound bed along with Tubigrip. There continues to be improvement in wound healing. 1/19; patient presents for follow-up. She has been using Medihoney and Hydrofera Blue to the wound bed along with her juxta light compression daily. There is been improvement in wound measurements. Patient has no issues or complaints today. 1/25; patient presents for follow-up. She has been using blast X and collagen to the wound bed under juxta light compression daily. She has no issues or complaints today. There is been improvement in wound healing. 2/1; patient presents for follow-up. She has been using blast X and collagen to the wound bed under juxta light compression daily. She has no issues or complaints today. 2/9; patient presents for follow-up. She has been using Hydrofera Blue with blast X under compression wrap daily. She has been approved for a skin substitute and patient would like to proceed with this. 2/15; patient presents for follow-up.  PuraPly was placed in standard fashion to the large anterior leg wound. She has been using blast X and Hydrofera Blue to the more proximal small wound. She has been using her juxta light compression daily. She has no issues or complaints today. Electronic Signature(s) Signed: 09/01/2022 1:41:02 PM By: Kalman Shan DO Entered By: Kalman Shan on 09/01/2022 13:17:05 -------------------------------------------------------------------------------- Physical Exam Details Patient Name: Date of Service: Hailey Courser, DO [name] 09/01/2022 12:00 PM Medical Record Number: [id number] Patient Account Number: [id number] Date of Birth/Sex: Treating RN: [birth year] (77 y.o. F) Primary Care Provider: Lorene Dy Other Clinician: Referring Provider: Treating Provider/Extender: [name] in Treatment: 12 Constitutional respirations regular, non-labored and within target range for patient.Marland Kitchen [name], [name] ([id number]) [id number] Page 5 of 13 Cardiovascular 2+ dorsalis pedis/posterior tibialis pulses. Psychiatric pleasant and cooperative. Notes T the lateral aspect of the left lower extremity there are 2 open wounds with granulation tissue and slough. Good edema control. Obvious deformity to the foot o from previous surgeries. No signs of surrounding soft tissue infection. Electronic Signature(s) Signed: 09/01/2022 1:41:02 PM By: Kalman Shan DO Entered By: Kalman Shan on 09/01/2022 13:17:27 -------------------------------------------------------------------------------- Physician Orders Details Patient Name: Date of Service: Hailey Courser, DO [name] 09/01/2022 12:00 PM Medical Record Number: [id number] Patient Account Number: [id number] Date of Birth/Sex: Treating RN: [birth year] (77 y.o. [name], [name] Primary Care Provider: Lorene Dy Other Clinician: Referring Provider: Treating Provider/Extender: [name] in Treatment: 12 Verbal / Phone Orders: No Diagnosis Coding ICD-10 Coding Code Description [phone] Non-pressure chronic ulcer of other part of left lower leg with other specified severity I87.312 Chronic venous hypertension (idiopathic) with ulcer of left lower extremity J84.9 Interstitial pulmonary disease, unspecified M06.9 Rheumatoid arthritis, unspecified Follow-up Appointments ppointment in 1 week. - Dr. Heber [place] 1030am 09/09/2022 Friday Return A ppointment in 2 weeks. - Dr. Heber [place] 09/15/2022 Thursday 1245pm Return A Other: - [employer] blastx and collagen. Anesthetic (In clinic) Topical Lidocaine 5% applied to wound bed Cellular or Tissue Based Products Wound #5 Left,Distal,Anterior Lower Leg Cellular or Tissue Based Product Type: - 08/18/2022 insurance ran organogenesis- 100% covered. 08/26/2022 #1 Puraply AM applied.  09/01/2022 #2 Puraply AM applied. Cellular or Tissue Based Product applied to wound bed, secured with steri-strips, cover with Adaptic or Mepitel. (DO NOT REMOVE). Bathing/ Shower/ Hygiene May shower with protection but do not get wound dressing(s) wet. Protect dressing(s) with water repellant cover (for example, large plastic bag) or a cast cover and may then take shower. Edema Control - Lymphedema / SCD / Other Elevate legs to the level of the heart or above for 30 minutes daily and/or when sitting for 3-4 times a day throughout the day. Avoid standing for long periods of time. Exercise regularly Compression stocking or Garment 20-30 mm/Hg pressure to: - start wearing the juxtalite HD apply in the morning and remove at night left leg. Non Wound Condition Protect area with: - buttock area with AandD ointment daily Wound Treatment Wound #4 - Lower Leg Wound Laterality: Left, Anterior, Proximal Cleanser: Wound Cleanser (Generic) 1 x Per Day/30 Days Discharge Instructions: Cleanse the wound with wound cleanser prior to applying a clean dressing using  gauze sponges, not tissue or cotton balls. Peri-Wound Care: Skin Prep (Generic) 1 x Per Day/30 Days DELCENIA, SABATER ([id number]) [phone].pdf Page 6 of 13 Discharge Instructions: Use skin prep as directed Topical: blastx 1 x Per Day/30 Days Discharge Instructions: apply a layer to wound bed. Prim Dressing: Hydrofera Blue Ready Transfer Foam, 4x5 (in/in) 1 x Per Day/30 Days ary Discharge Instructions: Apply to wound bed as instructed Secondary Dressing: ABD Pad, 5x9 (DME) (Generic) 1 x Per Day/30 Days Discharge Instructions: Apply over primary dressing as directed. Secured With: Scientist, forensic, Sterile 4x75 (in/in) (DME) (Generic) 1 x Per Day/30 Days Discharge Instructions: Secure with stretch gauze as directed. Secured With: 92M Medipore H Soft Cloth Surgical T ape, 4 x 10 (in/yd) (DME) (Generic) 1 x Per Day/30 Days Discharge Instructions: Secure with tape as directed. Compression Stockings: Circaid Juxta Lite Compression Wrap Left Leg Compression Amount: 20-30 mmHG Discharge Instructions: Apply Circaid Juxta Lite Compression Wrap daily as instructed. Apply first thing in the morning, remove at night before bed. Wound #5 - Lower Leg Wound Laterality: Left, Anterior, Distal Cleanser: Wound Cleanser (Generic) 1 x Per Week/30 Days Discharge Instructions: Cleanse the wound with wound cleanser prior to applying a clean dressing using gauze sponges, not tissue or cotton balls. Prim Dressing: Puraply #2 1 x Per Week/30 Days ary Discharge Instructions: applied by provider. Prim Dressing: Adaptic and steri-strips 1 x Per Week/30 Days ary Discharge Instructions: Used to secure Puraply AM. Leave in place. Do not remove. Prim Dressing: endoform 1 x Per Week/30 Days ary Discharge Instructions: provider applied to area not covered by Puraply AM. Secondary Dressing: ABD Pad, 5x9 (DME) (Generic) 1 x Per Week/30 Days Discharge Instructions: Apply over  primary dressing as directed. Secured With: Scientist, forensic, Sterile 4x75 (in/in) (DME) (Generic) 1 x Per Week/30 Days Discharge Instructions: Secure with stretch gauze as directed. Secured With: 92M Medipore H Soft Cloth Surgical T ape, 4 x 10 (in/yd) (DME) (Generic) 1 x Per Week/30 Days Discharge Instructions: Secure with tape as directed. Electronic Signature(s) Signed: 09/01/2022 1:41:02 PM By: Kalman Shan DO Entered By: Kalman Shan on 09/01/2022 13:17:34 -------------------------------------------------------------------------------- Problem List Details Patient Name: Date of Service: Hailey [name], DO [name] 09/01/2022 12:00 PM Medical Record Number: [id number] Patient Account Number: [id number] Date of Birth/Sex: Treating RN: 12-30-1945 (77 y.o. [name], [name] Primary Care Provider: Lorene Dy Other Clinician: Referring Provider: Treating Provider/Extender: [name] in  Treatment: 12 Active Problems ICD-10 Encounter Code Description Active Date MDM Diagnosis L97.828 Non-pressure chronic ulcer of other part of left lower leg with other specified 06/03/2022 No Yes severity HARSHIKA, GROSSHANS ([id number]) [phone].pdf Page 7 of 13 I87.312 Chronic venous hypertension (idiopathic) with ulcer of left lower extremity 06/03/2022 No Yes J84.9 Interstitial pulmonary disease, unspecified 06/03/2022 No Yes M06.9 Rheumatoid arthritis, unspecified 06/03/2022 No Yes Inactive Problems Resolved Problems Electronic Signature(s) Signed: 09/01/2022 1:41:02 PM By: Kalman Shan DO Entered By: Kalman Shan on 09/01/2022 13:16:11 -------------------------------------------------------------------------------- Progress Note Details Patient Name: Date of Service: [name]. 09/01/2022 12:00 PM Medical Record Number: [id number] Patient Account Number: [id number] Date of Birth/Sex: Treating  RN: October 04, 1945 (77 y.o. F) Primary Care Provider: Lorene Dy Other Clinician: Referring Provider: Treating Provider/Extender: [name] in Treatment: 12 Subjective Chief Complaint Information obtained from Patient 06/03/2022; Left lower extremity wounds History of Present Illness (HPI) Patient presents today for initial evaluation and our clinic as a referral from the [facility] health system Department of orthopedics for evaluation and treatment of the wound to his his of the left foot at the base of the great toe. Currently the good news is the patient really does not have any significant pain which is excellent. She has been seen by Dr. Linus Salmons at [facility]: infectious disease clinic that is the regional Center for infectious disease. Subsequently the patient was cultured and positive for methicillin sensitive staph aureus. She has been on antibiotics including Cipro prior to surgery as will several days following surgery. She then had clindamycin which was changed to doxycycline. She took this for 10 days. Subsequently the pain had improved and there was no longer any possible draining from the wound according to the notes. The patient sedentary rate as well as C-reactive protein have returned to normal ranges. Currently per Dr. Henreitta Leber assessment there was one dehiscence with no sign of osteomyelitis. He therefore discontinue the antibiotics with the completion of the last three days Of doxycycline. Other than that he just set her for a follow-up as needed. The patient does not have diabetes and is not a current smoker. At this time her treatment that was recommended by the surgeon was applying a Hydrocolloid dressing. Based on what I'm seeing at this point the patient actually has some Slough noted over the surface of the wound there really does not appear to be evidence of infection though I do think she does require some sharp debridement to clear away  the slough and help with appropriate wound healing. She does have areas of granulation buds noted. She may be a candidate for a skin substitute as well. We will see how things do over the next period of time until follow-up. No fevers, chills, nausea, or vomiting noted at this time. 06/13/18 on evaluation today patient actually appears to be doing better in regard to her toe ulcer. She's been using the Prisma on this region and that seems to have done extremely well for her. Fortunately there does not appear to be evidence of infection at this time which is good news. She did see her surgeon they were extremely pleased with the overall appearance of her wound. 06/27/18 on evaluation today patient actually appears to be doing very well in regard to her foot ulcer. She has been tolerating the dressing changes without complication which is great news. She did see her podiatrist they felt like everything is looking very nice as well and will please.  07/05/2018 surgical wound on the left foot. She appears to be doing well. Still surface debrided to remove however in general post debridement the wound bed looks quite healthy it is come down significantly in terms of dimensions using silver collagen. The patient describes pain when her foot is elevated either in bed at night [sometimes keeps her awake] or when is propped up on a foot rest. She basically takes analgesics for this. She does not really describe claudication with activity however her activity is very limited by interstitial lung disease. Her ABIs initially in this clinic were noncompressible. She is not a diabetic 07/20/2018; surgical wound on the left foot. Wound actually looks quite a bit better than I remember seeing this. She is still describing pain with her leg elevated at night that she does not get when the wound is supine. She does not really describe claudication but she is very limited by her pulmonary status. We have been using silver  collagen. 1/17; the patient has been followed up by orthopedics and discharge. Her arterial studies were actually quite good and should not be contributing to any pain. Slight reduction in ABIs but otherwise normal. Her wound is closed. She saw Dr. Linus Salmons of infectious disease surrounding the surgery. By review of our notes she was not felt to have osteomyelitis. 06/03/2022 [name] ([id number] Page 8 of 13 Ms. Samoya Hochman is a 77 year old female with a past medical history of interstitial lung disease on chronic oxygen via nasal cannula, rheumatoid arthritis, chronic diastolic heart failure and OSA that presents to the clinic for a 1 to 76-monthhistory of nonhealing ulcer to the left lower extremity. On 04/20/2022 she was admitted to the hospital for left lower extremity cellulitis. She required 8 days of IV vancomycin and Unasyn followed by a 14-day course of Augmentin and doxycycline. She has been using Silvadene cream to the area. She does not use compression therapy but does own compression stockings. She currently denies signs of infection. 11/27; patient presents for follow-up she is using Medihoney and Hydrofera Blue to the wound beds. She has been using her Tubigrip. She has no issues or complaints today. 12/4; patient presents for follow-up. She continues to use Medihoney and Hydrofera Blue to the wound beds. Several attempts have been made to schedule ABIs with TBI's. We gave patient the number today to call to have these scheduled. She has been using Tubigrip. She has no issues or complaints today. 12/18; she continues to use Medihoney and Hydrofera Blue in general the surface of the wound is looking somewhat better this week and measurements are slightly smaller. She did have her arterial studies done these were noncompressible bilaterally however TBI on the right of 0.69 on the left 0.62. Waveforms on the right were triphasic on the left  biphasic to triphasic. This does not suggest significant arterial disease to make healing wounds at this location difficult. 12/29; patient presents for follow-up. She has been using Medihoney and Hydrofera Blue to the wound bed. She has been using Tubigrip. 1/5; patient presents for follow-up. She is been using Medihoney and Hydrofera Blue to the wound bed along with Tubigrip. The Tubigrip was doubled at last clinic visit. There is been improvement in wound healing. 1/12; patient presents for follow-up. She has been using Medihoney and Hydrofera Blue to the wound bed along with Tubigrip. There continues to be improvement in wound healing. 1/19; patient presents for follow-up. She has been using Medihoney and Hydrofera Blue to the wound  bed along with her juxta light compression daily. There is been improvement in wound measurements. Patient has no issues or complaints today. 1/25; patient presents for follow-up. She has been using blast X and collagen to the wound bed under juxta light compression daily. She has no issues or complaints today. There is been improvement in wound healing. 2/1; patient presents for follow-up. She has been using blast X and collagen to the wound bed under juxta light compression daily. She has no issues or complaints today. 2/9; patient presents for follow-up. She has been using Hydrofera Blue with blast X under compression wrap daily. She has been approved for a skin substitute and patient would like to proceed with this. 2/15; patient presents for follow-up. PuraPly was placed in standard fashion to the large anterior leg wound. She has been using blast X and Hydrofera Blue to the more proximal small wound. She has been using her juxta light compression daily. She has no issues or complaints today. Patient History Information obtained from Patient. Family History Cancer - Maternal Grandparents, Diabetes - Siblings, Heart Disease - Maternal Grandparents,Paternal  Grandparents,Mother,Father,Siblings, Hypertension - Mother,Father,Siblings, Stroke - Paternal Grandparents, No family history of Hereditary Spherocytosis, Kidney Disease, Lung Disease, Seizures, Thyroid Problems, Tuberculosis. Social History Never smoker, Marital Status - Married, Alcohol Use - Moderate, Drug Use - No History, Caffeine Use - Moderate. Medical History Eyes Denies history of Cataracts, Optic Neuritis Ear/Nose/Mouth/Throat Denies history of Chronic sinus problems/congestion Hematologic/Lymphatic Denies history of Anemia, Hemophilia, Human Immunodeficiency Virus, Lymphedema, Sickle Cell Disease Respiratory Patient has history of Asthma, Sleep Apnea Denies history of Aspiration, Chronic Obstructive Pulmonary Disease (COPD), Pneumothorax, Tuberculosis Cardiovascular Patient has history of Hypertension Denies history of Angina, Arrhythmia, Congestive Heart Failure, Coronary Artery Disease, Deep Vein Thrombosis, Hypotension, Myocardial Infarction, Peripheral Arterial Disease, Peripheral Venous Disease, Phlebitis, Vasculitis Gastrointestinal Patient has history of Colitis Denies history of Cirrhosis , Crohnoos, Hepatitis A, Hepatitis B, Hepatitis C Endocrine Denies history of Type I Diabetes, Type II Diabetes Immunological Denies history of Lupus Erythematosus, Raynaudoos, Scleroderma Integumentary (Skin) Denies history of History of Burn Musculoskeletal Patient has history of Rheumatoid Arthritis, Osteoarthritis Denies history of Gout, Osteomyelitis Neurologic Denies history of Dementia, Neuropathy, Quadriplegia, Paraplegia, Seizure Disorder Oncologic Denies history of Received Chemotherapy, Received Radiation Psychiatric Denies history of Anorexia/bulimia, Confinement Anxiety Hospitalization/Surgery History - surgery toe. - right hip replacement 12/2018. - heart cath 12/17/2021. Medical A Surgical History Notes nd Respiratory 4L [place] 02 pulmonary fibrosis [name], [name] ([id number]) [phone].pdf Page 9 of 13 Objective Constitutional respirations regular, non-labored and within target range for patient.. Vitals Time Taken: 12:44 PM, Height: 61 in, Weight: 186 lbs, BMI: 35.1, Temperature: 98.4 F, Pulse: 90 bpm, Respiratory Rate: 20 breaths/min, Blood Pressure: 159/85 mmHg. Cardiovascular 2+ dorsalis pedis/posterior tibialis pulses. Psychiatric pleasant and cooperative. General Notes: T the lateral aspect of the left lower extremity there are 2 open wounds with granulation tissue and slough. Good edema control. Obvious o deformity to the foot from previous surgeries. No signs of surrounding soft tissue infection. Integumentary (Hair, Skin) Wound #4 status is Open. Original cause of wound was Gradually Appeared. The date acquired was: 04/19/2022. The wound has been in treatment 12 weeks. The wound is located on the Left,Proximal,Anterior Lower Leg. The wound measures 0.2cm length x 0.2cm width x 0.1cm depth; 0.031cm^2 area and 0.003cm^3 volume. There is Fat Layer (Subcutaneous Tissue) exposed. There is no tunneling or undermining noted. There is a medium amount of serosanguineous drainage noted. The wound margin is distinct with  the outline attached to the wound base. There is large (67-100%) pink, pale granulation within the wound bed. There is a small (1-33%) amount of necrotic tissue within the wound bed including Adherent Slough. The periwound skin appearance did not exhibit: Callus, Crepitus, Excoriation, Induration, Rash, Scarring, Dry/Scaly, Maceration, Atrophie Blanche, Cyanosis, Ecchymosis, Hemosiderin Staining, Mottled, Pallor, Rubor, Erythema. Periwound temperature was noted as No Abnormality. The periwound has tenderness on palpation. Wound #5 status is Open. Original cause of wound was Gradually Appeared. The date acquired was: 04/19/2022. The wound has been in treatment 12 weeks. The wound is located on the  [facility] Lower Leg. The wound measures 4.8cm length x 2.8cm width x 0.1cm depth; 10.556cm^2 area and 1.056cm^3 volume. There is tendon and Fat Layer (Subcutaneous Tissue) exposed. There is no tunneling or undermining noted. There is a medium amount of serosanguineous drainage noted. The wound margin is distinct with the outline attached to the wound base. There is large (67-100%) pink, pale granulation within the wound bed. There is a small (1-33%) amount of necrotic tissue within the wound bed including Adherent Slough. The periwound skin appearance did not exhibit: Callus, Crepitus, Excoriation, Induration, Rash, Scarring, Dry/Scaly, Maceration, Atrophie Blanche, Cyanosis, Ecchymosis, Hemosiderin Staining, Mottled, Pallor, Rubor, Erythema. Periwound temperature was noted as No Abnormality. The periwound has tenderness on palpation. 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 I debrided nonviable tissue. I recommended continuing the course with Hydrofera Blue and blast X to the proximal wound. PuraPly was placed in standard fashion to the anterior large wound. Continue compression wrap daily. Follow-up in 1 week. Procedures Wound #4 Pre-procedure diagnosis of Wound #4 is a Cellulitis located on the Left,Proximal,Anterior Lower Leg . There was a Excisional Skin/Subcutaneous Tissue Debridement with a total area of 1 sq cm performed by Kalman Shan, DO. With the following instrument(s): Curette to remove Viable and Non-Viable tissue/material. Material removed includes Subcutaneous Tissue, Slough, Skin: Dermis, and Skin: Epidermis after achieving pain control using Lidocaine 4% T opical Solution. A time out was  conducted at 12:50, prior to the start of the procedure. A Minimum amount of bleeding was controlled with Pressure. The procedure was tolerated well with a pain level of 0 throughout and a pain level of 0 following the procedure. Post Debridement Measurements: 0.2cm length x 0.2cm width x 0.1cm depth; 0.003cm^3 volume. Character of Wound/Ulcer Post Debridement is improved. Post procedure Diagnosis Wound #4: Same as Pre-Procedure Wound #5 Pre-procedure diagnosis of Wound #5 is a Cellulitis located on the Left,Distal,Anterior Lower Leg . There was a Excisional Skin/Subcutaneous Tissue Debridement with a total area of 13.44 sq cm performed by Kalman Shan, DO. With the following instrument(s): Curette to remove Viable and Non-Viable tissue/material. Material removed includes Subcutaneous Tissue, Slough, Skin: Dermis, and Skin: Epidermis after achieving pain control using Lidocaine 4% T opical Solution. A time out was conducted at 12:50, prior to the start of the procedure. A Minimum amount of bleeding was controlled with Pressure. The procedure was tolerated well with a pain level of 0 throughout and a pain level of 0 following the procedure. Post Debridement Measurements: 4.8cm length x 2.8cm width x 0.1cm depth; 1.056cm^3 volume. Character of Wound/Ulcer Post Debridement is improved. Post procedure Diagnosis Wound #5: Same as Pre-Procedure [name], [name] ([id number]) [phone].pdf Page 10 of 13 Pre-procedure diagnosis of Wound #  5 is a Cellulitis located on the Left,Distal,Anterior Lower Leg. A skin graft procedure using a bioengineered skin substitute/cellular or tissue based product was performed by Kalman Shan, DO with the following instrument(s): Forceps and Scissors. Puraply AM was applied and secured with Steri-Strips and adaptic. 8 sq cm of product was utilized and 0 sq cm was wasted. Post Application, ENDOFORM was applied. A Time Out was conducted at 13:00,  prior to the start of the procedure. The procedure was tolerated well with a pain level of 0 throughout and a pain level of 0 following the procedure. Post procedure Diagnosis Wound #5: Same as Pre-Procedure General Notes: Endoform used to the area of wound not covered by puraply am. Plan Follow-up Appointments: Return Appointment in 1 week. - Dr. Heber [place] 1030am 09/09/2022 Friday Return Appointment in 2 weeks. - Dr. Heber [place] 09/15/2022 Thursday 1245pm Other: - [employer] blastx and collagen. Anesthetic: (In clinic) Topical Lidocaine 5% applied to wound bed Cellular or Tissue Based Products: Wound #5 Left,Distal,Anterior Lower Leg: Cellular or Tissue Based Product Type: - 08/18/2022 insurance ran [employer]- 100% covered. 08/26/2022 #1 Puraply AM applied. 09/01/2022 #2 Puraply AM applied. Cellular or Tissue Based Product applied to wound bed, secured with steri-strips, cover with Adaptic or Mepitel. (DO NOT REMOVE). Bathing/ Shower/ Hygiene: May shower with protection but do not get wound dressing(s) wet. Protect dressing(s) with water repellant cover (for example, large plastic bag) or a cast cover and may then take shower. Edema Control - Lymphedema / SCD / Other: Elevate legs to the level of the heart or above for 30 minutes daily and/or when sitting for 3-4 times a day throughout the day. Avoid standing for long periods of time. Exercise regularly Compression stocking or Garment 20-30 mm/Hg pressure to: - start wearing the juxtalite HD apply in the morning and remove at night left leg. Non Wound Condition: Protect area with: - buttock area with AandD ointment daily WOUND #4: - Lower Leg Wound Laterality: Left, Anterior, Proximal Cleanser: Wound Cleanser (Generic) 1 x Per Day/30 Days Discharge Instructions: Cleanse the wound with wound cleanser prior to applying a clean dressing using gauze sponges, not tissue or cotton balls. Peri-Wound Care: Skin Prep (Generic) 1 x Per Day/30  Days Discharge Instructions: Use skin prep as directed Topical: blastx 1 x Per Day/30 Days Discharge Instructions: apply a layer to wound bed. Prim Dressing: Hydrofera Blue Ready Transfer Foam, 4x5 (in/in) 1 x Per Day/30 Days ary Discharge Instructions: Apply to wound bed as instructed Secondary Dressing: ABD Pad, 5x9 (DME) (Generic) 1 x Per Day/30 Days Discharge Instructions: Apply over primary dressing as directed. Secured With: Scientist, forensic, Sterile 4x75 (in/in) (DME) (Generic) 1 x Per Day/30 Days Discharge Instructions: Secure with stretch gauze as directed. Secured With: 28M Medipore H Soft Cloth Surgical T ape, 4 x 10 (in/yd) (DME) (Generic) 1 x Per Day/30 Days Discharge Instructions: Secure with tape as directed. Com pression Stockings: Circaid Juxta Lite Compression Wrap Compression Amount: 20-30 mmHg (left) Discharge Instructions: Apply Circaid Juxta Lite Compression Wrap daily as instructed. Apply first thing in the morning, remove at night before bed. WOUND #5: - Lower Leg Wound Laterality: Left, Anterior, Distal Cleanser: Wound Cleanser (Generic) 1 x Per Week/30 Days Discharge Instructions: Cleanse the wound with wound cleanser prior to applying a clean dressing using gauze sponges, not tissue or cotton balls. Prim Dressing: Puraply #2 1 x Per Week/30 Days ary Discharge Instructions: applied by provider. Prim Dressing: Adaptic and steri-strips 1 x Per Week/30  Days ary Discharge Instructions: Used to secure Puraply AM. Leave in place. Do not remove. Prim Dressing: endoform 1 x Per Week/30 Days ary Discharge Instructions: provider applied to area not covered by Puraply AM. Secondary Dressing: ABD Pad, 5x9 (DME) (Generic) 1 x Per Week/30 Days Discharge Instructions: Apply over primary dressing as directed. Secured With: Scientist, forensic, Sterile 4x75 (in/in) (DME) (Generic) 1 x Per Week/30 Days Discharge Instructions: Secure with  stretch gauze as directed. Secured With: 107M Medipore H Soft Cloth Surgical T ape, 4 x 10 (in/yd) (DME) (Generic) 1 x Per Week/30 Days Discharge Instructions: Secure with tape as directed. 1. In office sharp debridement 2. Hydrofera Blue and blast X 3. PuraPly placed in standard fashion 4. Juxta light compression daily 5. Follow-up in 1 week Electronic Signature(s) Signed: 09/01/2022 1:41:02 PM By: Kalman Shan DO Entered By: Kalman Shan on 09/01/2022 13:18:27 [name] ([id number]) [id number] Page 11 of 13 -------------------------------------------------------------------------------- HxROS Details Patient Name: Date of Service: Hailey [name], DO [name] 09/01/2022 12:00 PM Medical Record Number: [id number] Patient Account Number: [id number] Date of Birth/Sex: Treating RN: [birth year] (77 y.o. F) Primary Care Provider: Lorene Dy Other Clinician: Referring Provider: Treating Provider/Extender: [name] in Treatment: 12 Information Obtained From Patient Eyes Medical History: Negative for: Cataracts; Optic Neuritis Ear/Nose/Mouth/Throat Medical History: Negative for: Chronic sinus problems/congestion Hematologic/Lymphatic Medical History: Negative for: Anemia; Hemophilia; Human Immunodeficiency Virus; Lymphedema; Sickle Cell Disease Respiratory Medical History: Positive for: Asthma; Sleep Apnea Negative for: Aspiration; Chronic Obstructive Pulmonary Disease (COPD); Pneumothorax; Tuberculosis Past Medical History Notes: 4L [place] 02 pulmonary fibrosis Cardiovascular Medical History: Positive for: Hypertension Negative for: Angina; Arrhythmia; Congestive Heart Failure; Coronary Artery Disease; Deep Vein Thrombosis; Hypotension; Myocardial Infarction; Peripheral Arterial Disease; Peripheral Venous Disease; Phlebitis; Vasculitis Gastrointestinal Medical History: Positive for: Colitis Negative for: Cirrhosis ;  Crohns; Hepatitis A; Hepatitis B; Hepatitis C Endocrine Medical History: Negative for: Type I Diabetes; Type II Diabetes Immunological Medical History: Negative for: Lupus Erythematosus; Raynauds; Scleroderma Integumentary (Skin) Medical History: Negative for: History of Burn Musculoskeletal Medical History: Positive for: Rheumatoid Arthritis; Osteoarthritis Negative for: Gout; Osteomyelitis Neurologic Medical History: MIKHALA, MANRY ([id number]) [id number] Page 12 of 13 Negative for: Dementia; Neuropathy; Quadriplegia; Paraplegia; Seizure Disorder Oncologic Medical History: Negative for: Received Chemotherapy; Received Radiation Psychiatric Medical History: Negative for: Anorexia/bulimia; Confinement Anxiety Immunizations Pneumococcal Vaccine: Received Pneumococcal Vaccination: Yes Received Pneumococcal Vaccination On or After 60th Birthday: Yes Implantable Devices No devices added Hospitalization / Surgery History Type of Hospitalization/Surgery surgery toe right hip replacement 12/2018 heart cath 12/17/2021 Family and Social History Cancer: Yes - Maternal Grandparents; Diabetes: Yes - Siblings; Heart Disease: Yes - Maternal Grandparents,Paternal Grandparents,Mother,Father,Siblings; Hereditary Spherocytosis: No; Hypertension: Yes - Mother,Father,Siblings; Kidney Disease: No; Lung Disease: No; Seizures: No; Stroke: Yes - Paternal Grandparents; Thyroid Problems: No; Tuberculosis: No; Never smoker; Marital Status - Married; Alcohol Use: Moderate; Drug Use: No History; Caffeine Use: Moderate; Financial Concerns: No; Food, Clothing or Shelter Needs: No; Support System Lacking: No; Transportation Concerns: No Electronic Signature(s) Signed: 09/01/2022 1:41:02 PM By: Kalman Shan DO Entered By: Kalman Shan on 09/01/2022 13:17:10 -------------------------------------------------------------------------------- SuperBill Details Patient Name:  Date of Service: Hailey [name], DO [place]. 09/01/2022 Medical Record Number: [id number] Patient Account Number: [id number] Date of Birth/Sex: Treating RN: Nov 09, 1945 (77 y.o. [name] Primary Care Provider: Lorene Dy Other Clinician: Referring Provider: Treating Provider/Extender: [name] in Treatment: 12 Diagnosis Coding ICD-10 Codes Code Description [phone] Non-pressure chronic ulcer of  other part of left lower leg with other specified severity I87.312 Chronic venous hypertension (idiopathic) with ulcer of left lower extremity J84.9 Interstitial pulmonary disease, unspecified M06.9 Rheumatoid arthritis, unspecified Facility Procedures : CPT4 Code: [id number] Description: [id number] - PuraPly Product AM 2X4 (8 sq cm) Modifier: Quantity: 8 : Haun, DORO Malcolm Code: [id number] [place] ([id number]) [id number] ICD L9 Description: 15271 - SKIN SUB GRAFT TRNK/ARM/LEG ICD-10 Diagnosis Description I87.312 Chronic venous hypertension (idiopathic) with ulcer of left lower extremity L97.828 Non-pressure chronic ulcer of other part of left lower leg with other specified s  [id number] - DEB SUBQ TISSUE 20 SQ CM/< 59 -10 Diagnosis Description 7.828 Non-pressure chronic ulcer of other part of left lower leg with other specified severi Modifier: everity [id number] 1 ty Quantity: 1 .pdf Page 13 of 13 Physician Procedures : CPT4 Code Description Modifier [id number] - WC PHYS SKIN SUB GRAFT TRNK/ARM/LEG ICD-10 Diagnosis Description I87.312 Chronic venous hypertension (idiopathic) with ulcer of left lower extremity L97.828 Non-pressure chronic ulcer of other part of left  lower leg with other specified severity Quantity: 1 : [id number] 11042 - WC PHYS SUBQ TISS 20 SQ CM 59 ICD-10 Diagnosis Description L97.828 Non-pressure chronic ulcer of other part of left lower leg with other specified severity Quantity: 1 Electronic Signature(s) Signed: 09/01/2022  1:41:02 PM By: Kalman Shan DO Entered By: Kalman Shan on 09/01/2022 13:18:55

## 2022-09-02 NOTE — Progress Notes (Signed)
[name], [name] ([id number]) [id number] Page 1 of 10 Visit Report for 09/01/2022 Arrival Information Details Patient Name: Date of Service: [place]. 09/01/2022 12:00 PM Medical Record Number: [id number] Patient Account Number: [id number] Date of Birth/Sex: Treating RN: [name] (77 y.o. [name], [name] Primary Care [name]: [name] Other Clinician: Referring [name]: Treating [name]/Extender: [name] in Treatment: 12 Visit Information History Since Last Visit Added or deleted any medications: No Patient Arrived: Wheel Chair Any new allergies or adverse reactions: No Arrival Time: 12:35 Had a fall or experienced change in No Accompanied By: husband activities of daily living that may affect Transfer Assistance: Manual risk of falls: Patient Identification Verified: Yes Signs or symptoms of abuse/neglect since last visito No Secondary Verification Process Completed: Yes Hospitalized since last visit: No Patient Requires Transmission-Based Precautions: No Implantable device outside of the clinic excluding No Patient Has Alerts: Yes cellular tissue based products placed in the center Patient Alerts: ABI's: L: N/C 11/23 since last visit: Has Dressing in Place as Prescribed: Yes Has Compression in Place as Prescribed: Yes Pain Present Now: No Electronic Signature(s) Signed: 09/01/2022 4:32:13 PM By: Hailey Pilling RN, BSN Entered By: [name] on 09/01/2022 12:41:18 -------------------------------------------------------------------------------- Encounter Discharge Information Details Patient Name: Date of Service: Hailey Hailey App, DO [name] 09/01/2022 12:00 PM Medical Record Number: [id number] Patient Account Number: [id number] Date of Birth/Sex: Treating RN: [name] (77 y.o. [name] Primary Care Pamelyn Bancroft: [name] Other Clinician: Referring Kaiulani Sitton: Treating [name]/Extender:  [name] in Treatment: 12 Encounter Discharge Information Items Post Procedure Vitals Discharge Condition: Stable Temperature (F): 98.4 Ambulatory Status: Wheelchair Pulse (bpm): 90 Discharge Destination: Home Respiratory Rate (breaths/min): 20 Transportation: Private Auto Blood Pressure (mmHg): 159/85 Accompanied By: husband Schedule Follow-up Appointment: Yes Clinical Summary of Care: Electronic Signature(s) Signed: 09/01/2022 4:32:13 PM By: Hailey Pilling RN, BSN Entered By: [name] on 09/01/2022 13:10:43 [name] ([id number].pdf Page 2 of 10 -------------------------------------------------------------------------------- Lower Extremity Assessment Details Patient Name: Date of Service: [place]. 09/01/2022 12:00 PM Medical Record Number: [id number] Patient Account Number: [id number] Date of Birth/Sex: Treating RN: [name] (77 y.o. [name], [name] Primary Care Bayden Gil: [name] Other Clinician: Referring [name]: Treating [name]/Extender: [name] in Treatment: 12 Edema Assessment Assessed: [name]: Yes] [name]: No] Edema: [Left: Ye] [Right: s] Calf Left: Right: Point of Measurement: 29 cm From Medial Instep 32 cm Ankle Left: Right: Point of Measurement: 9 cm From Medial Instep 24 cm Vascular Assessment Pulses: Dorsalis Pedis Palpable: [Left:Yes] Electronic Signature(s) Signed: 09/01/2022 4:32:13 PM By: Hailey Pilling RN, BSN Entered By: [name] on 09/01/2022 12:41:37 -------------------------------------------------------------------------------- Multi Wound Chart Details Patient Name: Date of Service: [place]. 09/01/2022 12:00 PM Medical Record Number: [id number] Patient Account Number: [id number] Date of Birth/Sex: Treating RN: [birth year] (77 y.o. F) Primary Care [name]: [name] Other Clinician: Referring  [name]: Treating [name]/Extender: [name] in Treatment: 12 Vital Signs Height(in): 61 Pulse(bpm): 90 Weight(lbs): 186 Blood Pressure(mmHg): 159/85 Body Mass Index(BMI): 35.1 Temperature(F): 98.4 Respiratory Rate(breaths/min): 20 [4:Photos:] [N/A:N/A [id number].pdf Page 3 of 10] Left, Proximal, Anterior Lower Leg Left, Distal, Anterior Lower Leg N/A Wound Location: Gradually Appeared Gradually Appeared N/A Wounding Event: Cellulitis Cellulitis N/A Primary Etiology: Asthma, Sleep Apnea, Hypertension, Asthma, Sleep Apnea, Hypertension, N/A Comorbid History: Colitis, Rheumatoid Arthritis, Colitis, Rheumatoid Arthritis, Osteoarthritis Osteoarthritis 04/19/2022 04/19/2022 N/A Date Acquired: 12 12 N/A Weeks  of Treatment: Open Open N/A Wound Status: No No N/A Wound Recurrence: 0.2x0.2x0.1 4.8x2.8x0.1 N/A Measurements L x W x D (cm) 0.031 10.556 N/A A (cm) : rea 0.003 1.056 N/A Volume (cm) : 99.30% 32.10% N/A % Reduction in A rea: 99.80% 32.10% N/A % Reduction in Volume: Full Thickness With Exposed Support Full Thickness With Exposed Support N/A Classification: Structures Structures Medium Medium N/A Exudate A mount: Serosanguineous Serosanguineous N/A Exudate Type: red, brown red, brown N/A Exudate Color: Distinct, outline attached Distinct, outline attached N/A Wound Margin: Large (67-100%) Large (67-100%) N/A Granulation A mount: Pink, Pale Pink, Pale N/A Granulation Quality: Small (1-33%) Small (1-33%) N/A Necrotic A mount: Fat Layer (Subcutaneous Tissue): Yes Fat Layer (Subcutaneous Tissue): Yes N/A Exposed Structures: Fascia: No Tendon: Yes Tendon: No Fascia: No Muscle: No Muscle: No Joint: No Joint: No Bone: No Bone: No Large (67-100%) Small (1-33%) N/A Epithelialization: Debridement - Excisional Debridement - Excisional N/A Debridement: Pre-procedure Verification/Time Out 12:50 12:50  N/A Taken: Lidocaine 4% Topical Solution Lidocaine 4% Topical Solution N/A Pain Control: Subcutaneous, Slough Subcutaneous, Slough N/A Tissue Debrided: Skin/Subcutaneous Tissue Skin/Subcutaneous Tissue N/A Level: 1 13.44 N/A Debridement A (sq cm): rea Curette Curette N/A Instrument: Minimum Minimum N/A Bleeding: Pressure Pressure N/A Hemostasis A chieved: 0 0 N/A Procedural Pain: 0 0 N/A Post Procedural Pain: Procedure was tolerated well Procedure was tolerated well N/A Debridement Treatment Response: 0.2x0.2x0.1 4.8x2.8x0.1 N/A Post Debridement Measurements L x W x D (cm) 0.003 1.[name] N/A Post Debridement Volume: (cm) Excoriation: No Excoriation: No N/A Periwound Skin Texture: Induration: No Induration: No Callus: No Callus: No Crepitus: No Crepitus: No Rash: No Rash: No Scarring: No Scarring: No Maceration: No Maceration: No N/A Periwound Skin Moisture: Dry/Scaly: No Dry/Scaly: No Atrophie Blanche: No Atrophie Blanche: No N/A Periwound Skin Color: Cyanosis: No Cyanosis: No Ecchymosis: No Ecchymosis: No Erythema: No Erythema: No Hemosiderin Staining: No Hemosiderin Staining: No Mottled: No Mottled: No Pallor: No Pallor: No Rubor: No Rubor: No No Abnormality No Abnormality N/A Temperature: Yes Yes N/A Tenderness on Palpation: Debridement Cellular or Tissue Based Product N/A Procedures Performed: Debridement Treatment Notes Wound #4 (Lower Leg) Wound Laterality: Left, Anterior, Proximal Cleanser Wound Cleanser Discharge Instruction: Cleanse the wound with wound cleanser prior to applying a clean dressing using gauze sponges, not tissue or cotton balls. Peri-Wound Care Skin Prep Discharge Instruction: Use skin prep as directed Topical blastx Discharge Instruction: apply a layer to wound bed. [name], [name] ([id number]) [id number] Page 4 of 10 Primary Dressing Hydrofera [name] Ready Transfer Foam, 4x5  (in/in) Discharge Instruction: Apply to wound bed as instructed Secondary Dressing Zetuvit Plus Silicone Border Dressing 4x4 (in/in) Discharge Instruction: Apply silicone border over primary dressing as directed. Secured With Compression Wrap Compression Stockings Circaid Juxta Lite Compression Wrap Quantity: 1 Left Leg Compression Amount: 20-30 mmHg Discharge Instruction: Apply Circaid Juxta Lite Compression Wrap daily as instructed. Apply first thing in the morning, remove at night before bed. Add-Ons Wound #5 (Lower Leg) Wound Laterality: Left, Anterior, Distal Cleanser Wound Cleanser Discharge Instruction: Cleanse the wound with wound cleanser prior to applying a clean dressing using gauze sponges, not tissue or cotton balls. Peri-Wound Care Topical Primary Dressing Puraply #2 Discharge Instruction: applied by Damond Borchers. Adaptic and steri-strips Discharge Instruction: Used to secure Puraply AM. Leave in place. Do not remove. endoform Discharge Instruction: Demontre Padin applied to area not covered by Puraply AM. Secondary Dressing Woven Gauze Sponge, Non-Sterile 4x4 in Discharge Instruction: Apply over primary dressing as directed. Zetuvit Plus Silicone Border Dressing 5x5 (  in/in) Discharge Instruction: Apply silicone border over primary dressing as directed. Secured With Compression Wrap Compression Stockings Environmental education officer) Signed: 09/01/2022 1:41:02 PM By: Kalman Shan DO Entered By: Kalman Shan on 09/01/2022 13:16:17 -------------------------------------------------------------------------------- Multi-Disciplinary Care Plan Details Patient Name: Date of Service: [place]. 09/01/2022 12:00 PM Medical Record Number: [id number] Patient Account Number: [id number] Date of Birth/Sex: Treating RN: [birth year] [name] (77 y.o. [name], [name] Primary Care Daymon Hora: [name] Other Clinician: Referring Kruze Atchley: Treating Armonie Mettler/Extender:  [name] in Treatment: [address], [place] ([id number]) [id number] Page 5 of 10 Active Inactive Pain, Acute or Chronic Nursing Diagnoses: Pain, acute or chronic: actual or potential Potential alteration in comfort, pain Goals: Patient will verbalize adequate pain control and receive pain control interventions during procedures as needed Date Initiated: 06/03/2022 Target Resolution Date: 10/14/2022 Goal Status: Active Patient/caregiver will verbalize comfort level met Date Initiated: 06/03/2022 Target Resolution Date: 10/14/2022 Goal Status: Active Interventions: Encourage patient to take pain medications as prescribed Provide education on pain management Treatment Activities: Administer pain control measures as ordered : 06/03/2022 Notes: Electronic Signature(s) Signed: 09/01/2022 4:32:13 PM By: Hailey Pilling RN, BSN Entered By: [name] on 09/01/2022 12:46:43 -------------------------------------------------------------------------------- Pain Assessment Details Patient Name: Date of Service: Hailey Courser, DO RO [place]. 09/01/2022 12:00 PM Medical Record Number: [id number] Patient Account Number: [id number] Date of Birth/Sex: Treating RN: [name][birth year] (77 y.o. [name] Primary Care Wilhelmenia Addis: [name] Other Clinician: Referring Woodrow Drab: Treating Rubyann Lingle/Extender: [name] in Treatment: 12 Active Problems Location of Pain Severity and Description of Pain Patient Has Paino No Site Locations Rate the pain. Current Pain Level: 0 Pain Management and Medication Current Pain Management: Medication: No Cold Application: No Rest: No Massage: No Activity: No T.E.N.S.: No [name], [name] ([id number]) [phone].pdf Page 6 of 10 Heat Application: No Leg drop or elevation: No Is the Current Pain Management Adequate: Adequate How does your wound impact your  activities of daily livingo Sleep: No Bathing: No Appetite: No Relationship With Others: No Bladder Continence: No Emotions: No Bowel Continence: No Work: No Toileting: No Drive: No Dressing: No Hobbies: No Engineer, maintenance) Signed: 09/01/2022 4:32:13 PM By: Hailey Pilling RN, BSN Entered By: [name] on 09/01/2022 12:41:29 -------------------------------------------------------------------------------- Patient/Caregiver Education Details Patient Name: Date of Service: Hailey Hailey App, DO RO Marry Guan 2/15/2024andnbsp12:00 PM Medical Record Number: [id number] Patient Account Number: [id number] Date of Birth/Gender: Treating RN: [birth year][name] (77 y.o. [name] Primary Care Physician: [name] Other Clinician: Referring Physician: Treating Physician/Extender: [name] in Treatment: 12 Education Assessment Education Provided To: Patient Education Topics Provided Wound/Skin Impairment: Handouts: Caring for Your Ulcer Methods: Explain/Verbal Responses: Reinforcements needed Electronic Signature(s) Signed: 09/01/2022 4:32:13 PM By: Hailey Pilling RN, BSN Entered By: [name] on 09/01/2022 12:46:58 -------------------------------------------------------------------------------- Wound Assessment Details Patient Name: Date of Service: [place]. 09/01/2022 12:00 PM Medical Record Number: [id number] Patient Account Number: [id number] Date of Birth/Sex: Treating RN: [birth year] [name] (77 y.o. [name] Primary Care Shaaron Golliday: [name] Other Clinician: Referring Yordi Krager: Treating Clela Hagadorn/Extender: [name] in Treatment: 12 Wound Status Wound Number: 4 Primary Cellulitis Etiology: Wound Location: Left, Proximal, Anterior Lower Leg Wound Open Wounding Event: Gradually Appeared Status: Date Acquired: 04/19/2022 Comorbid Asthma, Sleep Apnea, Hypertension, Colitis, Rheumatoid Weeks Of  Treatment: 12 History: Arthritis, Osteoarthritis Clustered Wound: No [name], [name] ([id number]) [id number] Page 7 of 10 Photos Wound Measurements Length: (cm) 0.2 Width: (  cm) 0.2 Depth: (cm) 0.1 Area: (cm) 0.031 Volume: (cm) 0.003 % Reduction in Area: 99.3% % Reduction in Volume: 99.8% Epithelialization: Large (67-100%) Tunneling: No Undermining: No Wound Description Classification: Full Thickness With Exposed Support Structures Wound Margin: Distinct, outline attached Exudate Amount: Medium Exudate Type: Serosanguineous Exudate Color: red, brown Foul Odor After Cleansing: No Slough/Fibrino Yes Wound Bed Granulation Amount: Large (67-100%) Exposed Structure Granulation Quality: Pink, Pale Fascia Exposed: No Necrotic Amount: Small (1-33%) Fat Layer (Subcutaneous Tissue) Exposed: Yes Necrotic Quality: Adherent Slough Tendon Exposed: No Muscle Exposed: No Joint Exposed: No Bone Exposed: No Periwound Skin Texture Texture Color No Abnormalities Noted: No No Abnormalities Noted: No Callus: No Atrophie Blanche: No Crepitus: No Cyanosis: No Excoriation: No Ecchymosis: No Induration: No Erythema: No Rash: No Hemosiderin Staining: No Scarring: No Mottled: No Pallor: No Moisture Rubor: No No Abnormalities Noted: No Dry / Scaly: No Temperature / Pain Maceration: No Temperature: No Abnormality Tenderness on Palpation: Yes Treatment Notes Wound #4 (Lower Leg) Wound Laterality: Left, Anterior, Proximal Cleanser Wound Cleanser Discharge Instruction: Cleanse the wound with wound cleanser prior to applying a clean dressing using gauze sponges, not tissue or cotton balls. Peri-Wound Care Skin Prep Discharge Instruction: Use skin prep as directed Topical blastx Discharge Instruction: apply a layer to wound bed. Primary Dressing Hydrofera [name] Ready Transfer Foam, 4x5 (in/in) Discharge Instruction: Apply to wound bed as  instructed [name], [name] ([id number]) [phone].pdf Page 8 of 10 Secondary Dressing Zetuvit Plus Silicone Border Dressing 4x4 (in/in) Discharge Instruction: Apply silicone border over primary dressing as directed. Secured With Compression Wrap Compression Stockings Circaid Juxta Lite Compression Wrap Quantity: 1 Left Leg Compression Amount: 20-30 mmHg Discharge Instruction: Apply Circaid Juxta Lite Compression Wrap daily as instructed. Apply first thing in the morning, remove at night before bed. Add-Ons Electronic Signature(s) Signed: 09/01/2022 4:32:13 PM By: Hailey Pilling RN, BSN Entered By: [name] on 09/01/2022 12:50:34 -------------------------------------------------------------------------------- Wound Assessment Details Patient Name: Date of Service: [name]. 09/01/2022 12:00 PM Medical Record Number: [id number] Patient Account Number: [id number] Date of Birth/Sex: Treating RN: [name][birth year] (77 y.o. [name], [name] Primary Care Angelena Sand: [name] Other Clinician: Referring Arlett Goold: Treating Mykia Holton/Extender: [name] in Treatment: 12 Wound Status Wound Number: 5 Primary Cellulitis Etiology: Wound Location: Left, Distal, Anterior Lower Leg Wound Open Wounding Event: Gradually Appeared Status: Date Acquired: 04/19/2022 Comorbid Asthma, Sleep Apnea, Hypertension, Colitis, Rheumatoid Weeks Of Treatment: 12 History: Arthritis, Osteoarthritis Clustered Wound: No Photos Wound Measurements Length: (cm) 4.8 Width: (cm) 2.8 Depth: (cm) 0.1 Area: (cm) 10.556 Volume: (cm) 1.056 % Reduction in Area: 32.1% % Reduction in Volume: 32.1% Epithelialization: Small (1-33%) Tunneling: No Undermining: No Wound Description Classification: Full Thickness With Exposed Support Structures Wound Margin: Distinct, outline attached Exudate Amount: Medium Exudate Type: Serosanguineous Exudate Color: red,  brown Foul Odor After Cleansing: No Slough/Fibrino Yes Wound Bed [name], [name] ([id number]) [phone].pdf Page 9 of 10 Granulation Amount: Large (67-100%) Exposed Structure Granulation Quality: Pink, Pale Fascia Exposed: No Necrotic Amount: Small (1-33%) Fat Layer (Subcutaneous Tissue) Exposed: Yes Necrotic Quality: Adherent Slough Tendon Exposed: Yes Muscle Exposed: No Joint Exposed: No Bone Exposed: No Periwound Skin Texture Texture Color No Abnormalities Noted: No No Abnormalities Noted: No Callus: No Atrophie Blanche: No Crepitus: No Cyanosis: No Excoriation: No Ecchymosis: No Induration: No Erythema: No Rash: No Hemosiderin Staining: No Scarring: No Mottled: No Pallor: No Moisture Rubor: No No Abnormalities Noted: No Dry / Scaly: No Temperature / Pain Maceration: No  Temperature: No Abnormality Tenderness on Palpation: Yes Treatment Notes Wound #5 (Lower Leg) Wound Laterality: Left, Anterior, Distal Cleanser Wound Cleanser Discharge Instruction: Cleanse the wound with wound cleanser prior to applying a clean dressing using gauze sponges, not tissue or cotton balls. Peri-Wound Care Topical Primary Dressing Puraply #2 Discharge Instruction: applied by Dandrae Kustra. Adaptic and steri-strips Discharge Instruction: Used to secure Puraply AM. Leave in place. Do not remove. endoform Discharge Instruction: [name] applied to area not covered by Puraply AM. Secondary Dressing Woven Gauze Sponge, Non-Sterile 4x4 in Discharge Instruction: Apply over primary dressing as directed. Zetuvit Plus Silicone Border Dressing 5x5 (in/in) Discharge Instruction: Apply silicone border over primary dressing as directed. Secured With Compression Wrap Compression Stockings Environmental education officer) Signed: 09/01/2022 4:32:13 PM By: Hailey Pilling RN, BSN Entered By: [name] on 09/01/2022  12:50:53 -------------------------------------------------------------------------------- Vitals Details Patient Name: Date of Service: [place]. 09/01/2022 12:00 PM Medical Record Number: [id number] Patient Account Number: [id number] Date of Birth/Sex: Treating RN: Hailey [birth year], [name] (77 y.o. [name] Primary Care Emojean Gertz: [name] Other Clinician: Barrie [name] ([id number]) [id number] Page 10 of 10 Referring Blu Mcglaun: Treating Rozlyn Yerby/Extender: [name] in Treatment: 12 Vital Signs Time Taken: 12:44 Temperature (F): 98.4 Height (in): 61 Pulse (bpm): 90 Weight (lbs): 186 Respiratory Rate (breaths/min): 20 Body Mass Index (BMI): 35.1 Blood Pressure (mmHg): 159/85 Reference Range: 80 - 120 mg / dl Electronic Signature(s) Signed: 09/01/2022 4:32:13 PM By: Hailey Pilling RN, BSN Entered By: [name] on 09/01/2022 12:44:57

## 2022-09-09 ENCOUNTER — Encounter (HOSPITAL_BASED_OUTPATIENT_CLINIC_OR_DEPARTMENT_OTHER): Payer: Medicare Other | Admitting: Internal Medicine

## 2022-09-09 ENCOUNTER — Ambulatory Visit: Payer: Medicare Other | Admitting: Internal Medicine

## 2022-09-09 DIAGNOSIS — I87312 Chronic venous hypertension (idiopathic) with ulcer of left lower extremity: Secondary | ICD-10-CM

## 2022-09-09 DIAGNOSIS — L97828 Non-pressure chronic ulcer of other part of left lower leg with other specified severity: Secondary | ICD-10-CM

## 2022-09-10 NOTE — Progress Notes (Signed)
MENDI, SWAPP ([id number]) [id number] Page 1 of 10 Visit Report for 09/09/2022 Arrival Information Details Patient Name: Date of Service: Hailey Courser, DO [name] 09/09/2022 10:30 A M Medical Record Number: [id number] Patient Account Number: [id number] Date of Birth/Sex: Treating RN: 1946/05/14 (77 y.o. F) Primary Care [name]: [name] Other Clinician: Referring [name]: Treating [name]/Extender: [name] in Treatment: 14 Visit Information History Since Last Visit Added or deleted any medications: No Patient Arrived: Wheel Chair Any new allergies or adverse reactions: No Arrival Time: 10:39 Had a fall or experienced change in No Accompanied By: husband activities of daily living that may affect Transfer Assistance: None risk of falls: Patient Identification Verified: Yes Signs or symptoms of abuse/neglect since last visito No Secondary Verification Process Completed: Yes Hospitalized since last visit: No Patient Requires Transmission-Based Precautions: No Implantable device outside of the clinic excluding No Patient Has Alerts: Yes cellular tissue based products placed in the center Patient Alerts: ABI's: L: N/C 11/23 since last visit: Has Dressing in Place as Prescribed: Yes Has Compression in Place as Prescribed: Yes Pain Present Now: No Electronic Signature(s) Signed: 09/09/2022 12:09:33 PM By: [name] Entered By: [name] on 09/09/2022 10:42:12 -------------------------------------------------------------------------------- Encounter Discharge Information Details Patient Name: Date of Service: Hailey Courser, DO [name] 09/09/2022 10:30 A M Medical Record Number: [id number] Patient Account Number: [id number] Date of Birth/Sex: Treating RN: 07-18-1946 (77 y.o. [name] Primary Care [name]: [name] Other Clinician: Referring [name]: Treating [name]/Extender: [name] in Treatment: 14 Encounter Discharge Information Items Post Procedure Vitals Discharge Condition: Stable Temperature (F): 98.6 Ambulatory Status: Wheelchair Pulse (bpm): 98 Discharge Destination: Home Respiratory Rate (breaths/min): 20 Transportation: Private Auto Blood Pressure (mmHg): 127/65 Accompanied By: spouse Schedule Follow-up Appointment: Yes Clinical Summary of Care: Electronic Signature(s) Signed: 09/09/2022 12:41:16 PM By: Hailey Pilling RN, BSN Entered By: [name] on 09/09/2022 11:11:26 [name] ([id number].pdf Page 2 of 10 -------------------------------------------------------------------------------- Lower Extremity Assessment Details Patient Name: Date of Service: Hailey Courser, DO RO [place] S. 09/09/2022 10:30 A M Medical Record Number: [id number] Patient Account Number: [id number] Date of Birth/Sex: Treating RN: [birth year] (77 y.o. F) Primary Care [name]: [name] Other Clinician: Referring [name]: Treating Badr Piedra/Extender: [name] in Treatment: 14 Edema Assessment Assessed: [name]: No] [name]: No] Edema: [Left: Ye] [Right: s] Calf Left: Right: Point of Measurement: 29 cm From Medial Instep 31 cm Ankle Left: Right: Point of Measurement: 9 cm From Medial Instep 21.5 cm Vascular Assessment Pulses: Dorsalis Pedis Palpable: [Left:Yes] Electronic Signature(s) Signed: 09/09/2022 12:09:33 PM By: [name] Entered By: [name] on 09/09/2022 10:47:35 -------------------------------------------------------------------------------- Multi Wound Chart Details Patient Name: Date of Service: Hailey Courser, DO [name] 09/09/2022 10:30 A M Medical Record Number: [id number] Patient Account Number: [id number] Date of Birth/Sex: Treating RN: [birth year] (77 y.o. F) Primary Care Tamaya Pun: [name] Other Clinician: Referring [name]: Treating  [name]/Extender: [name] in Treatment: 14 Vital Signs Height(in): 61 Pulse(bpm): 98 Weight(lbs): 186 Blood Pressure(mmHg): 127/65 Body Mass Index(BMI): 35.1 Temperature(F): 98.6 Respiratory Rate(breaths/min): 20 [4:Photos:] [N/A:N/A [id number].pdf Page 3 of 10] Left, Proximal, Anterior Lower Leg Left, Distal, Anterior Lower Leg N/A Wound Location: Gradually Appeared Gradually Appeared N/A Wounding Event: Cellulitis Cellulitis N/A Primary Etiology: Asthma, Sleep Apnea, Hypertension, Asthma, Sleep Apnea, Hypertension, N/A Comorbid History: Colitis, Rheumatoid Arthritis, Colitis, Rheumatoid Arthritis, Osteoarthritis Osteoarthritis 04/19/2022 04/19/2022 N/A Date Acquired: 14 14 N/A Weeks of Treatment: Open Open  N/A Wound Status: No No N/A Wound Recurrence: 0.1x0.1x0.1 4.8x3x0.1 N/A Measurements L x W x D (cm) 0.008 11.31 N/A A (cm) : rea 0.001 1.131 N/A Volume (cm) : 99.80% 27.30% N/A % Reduction in A rea: 99.90% 27.30% N/A % Reduction in Volume: Full Thickness With Exposed Support Full Thickness With Exposed Support N/A Classification: Structures Structures Medium Medium N/A Exudate A mount: Serosanguineous Serosanguineous N/A Exudate Type: red, brown red, brown N/A Exudate Color: Distinct, outline attached Distinct, outline attached N/A Wound Margin: Large (67-100%) Large (67-100%) N/A Granulation A mount: Red Pink, Pale N/A Granulation Quality: None Present (0%) Small (1-33%) N/A Necrotic A mount: Fat Layer (Subcutaneous Tissue): Yes Fat Layer (Subcutaneous Tissue): Yes N/A Exposed Structures: Fascia: No Tendon: Yes Tendon: No Fascia: No Muscle: No Muscle: No Joint: No Joint: No Bone: No Bone: No Large (67-100%) Small (1-33%) N/A Epithelialization: N/A Debridement - Excisional N/A Debridement: Pre-procedure Verification/Time Out N/A 10:55 N/A Taken: N/A Lidocaine 5% topical ointment  N/A Pain Control: N/A Subcutaneous, Slough N/A Tissue Debrided: N/A Skin/Subcutaneous Tissue N/A Level: N/A 14.4 N/A Debridement A (sq cm): rea N/A Curette N/A Instrument: N/A Minimum N/A Bleeding: N/A Pressure N/A Hemostasis A chieved: N/A 0 N/A Procedural Pain: N/A 0 N/A Post Procedural Pain: N/A Procedure was tolerated well N/A Debridement Treatment Response: N/A 4.8x3x0.1 N/A Post Debridement Measurements L x W x D (cm) N/A 1.131 N/A Post Debridement Volume: (cm) Excoriation: No Excoriation: No N/A Periwound Skin Texture: Induration: No Induration: No Callus: No Callus: No Crepitus: No Crepitus: No Rash: No Rash: No Scarring: No Scarring: No Maceration: No Maceration: No N/A Periwound Skin Moisture: Dry/Scaly: No Dry/Scaly: No Atrophie Blanche: No Atrophie Blanche: No N/A Periwound Skin Color: Cyanosis: No Cyanosis: No Ecchymosis: No Ecchymosis: No Erythema: No Erythema: No Hemosiderin Staining: No Hemosiderin Staining: No Mottled: No Mottled: No Pallor: No Pallor: No Rubor: No Rubor: No No Abnormality No Abnormality N/A Temperature: Yes Yes N/A Tenderness on Palpation: Cellular or Tissue Based Product Cellular or Tissue Based Product N/A Procedures Performed: Debridement Treatment Notes Wound #4 (Lower Leg) Wound Laterality: Left, Anterior, Proximal Cleanser Wound Cleanser Discharge Instruction: Cleanse the wound with wound cleanser prior to applying a clean dressing using gauze sponges, not tissue or cotton balls. Peri-Wound Care Topical Primary Dressing Puraply #3 Discharge Instruction: applied by Iviona Hole. Adaptic and steri-strips DERRIANNA, BURGES ([id number]) [id number].pdf Page 4 of 10 Discharge Instruction: Used to secure Puraply AM. Leave in place. Do not remove. Secondary Dressing ABD Pad, 5x9 Discharge Instruction: Apply over primary dressing as directed. Secured With Conforming Stretch Gauze  Bandage Roll, Sterile 4x75 (in/in) Discharge Instruction: Secure with stretch gauze as directed. 48M Medipore H Soft Cloth Surgical T ape, 4 x 10 (in/yd) Discharge Instruction: Secure with tape as directed. Compression Wrap Compression Stockings Add-Ons Wound #5 (Lower Leg) Wound Laterality: Left, Anterior, Distal Cleanser Wound Cleanser Discharge Instruction: Cleanse the wound with wound cleanser prior to applying a clean dressing using gauze sponges, not tissue or cotton balls. Peri-Wound Care Topical Primary Dressing Puraply #3 Discharge Instruction: applied by [name]. Adaptic and steri-strips Discharge Instruction: Used to secure Puraply AM. Leave in place. Do not remove. Secondary Dressing ABD Pad, 5x9 Discharge Instruction: Apply over primary dressing as directed. Secured With Conforming Stretch Gauze Bandage Roll, Sterile 4x75 (in/in) Discharge Instruction: Secure with stretch gauze as directed. 48M Medipore H Soft Cloth Surgical T ape, 4 x 10 (in/yd) Discharge Instruction: Secure with tape as directed. Compression Wrap Compression Stockings Add-Ons Electronic Signature(s) Signed: 09/09/2022 12:04:24  PM By: Kalman Shan DO Entered By: Kalman Shan on 09/09/2022 11:29:00 -------------------------------------------------------------------------------- Multi-Disciplinary Care Plan Details Patient Name: Date of Service: [name] [name] 09/09/2022 10:30 A M Medical Record Number: [id number] Patient Account Number: [id number] Date of Birth/Sex: Treating RN: 1946-02-06 (77 y.o. [name], [name] Primary Care Rindy Kollman: [name] Other Clinician: Referring Desarai Barrack: Treating Skyler Dusing/Extender: [name] in Treatment: [address] [place], Arvil Persons ([id number]) [id number] Page 5 of 10 Pain, Acute or Chronic Nursing Diagnoses: Pain, acute or chronic: actual or potential Potential alteration in  comfort, pain Goals: Patient will verbalize adequate pain control and receive pain control interventions during procedures as needed Date Initiated: 06/03/2022 Target Resolution Date: 10/14/2022 Goal Status: Active Patient/caregiver will verbalize comfort level met Date Initiated: 06/03/2022 Target Resolution Date: 10/14/2022 Goal Status: Active Interventions: Encourage patient to take pain medications as prescribed Provide education on pain management Treatment Activities: Administer pain control measures as ordered : 06/03/2022 Notes: Electronic Signature(s) Signed: 09/09/2022 12:41:16 PM By: Hailey Pilling RN, BSN Entered By: [name] on 09/09/2022 11:02:10 -------------------------------------------------------------------------------- Pain Assessment Details Patient Name: Date of Service: Hailey Courser, DO [name] 09/09/2022 10:30 A M Medical Record Number: [id number] Patient Account Number: [id number] Date of Birth/Sex: Treating RN: [birth year] (77 y.o. F) Primary Care Sheritta Deeg: [name] Other Clinician: Referring Bach Rocchi: Treating Graclyn Lawther/Extender: [name] in Treatment: 14 Active Problems Location of Pain Severity and Description of Pain Patient Has Paino No Site Locations Pain Management and Medication Current Pain Management: Electronic Signature(s) Signed: 09/09/2022 12:09:33 PM By: Raye Sorrow, Arvil Persons ([id number]) [id number] Page 6 of 10 Entered By: [name] on 09/09/2022 10:43:23 -------------------------------------------------------------------------------- Patient/Caregiver Education Details Patient Name: Date of Service: [name], DO RO Marry Guan 2/23/2024andnbsp10:30 [place] Record Number: [id number] Patient Account Number: [id number] Date of Birth/Gender: Treating RN: 01/02/1946 (77 y.o. [name] Primary Care Physician: [name] Other Clinician: Referring  Physician: Treating Physician/Extender: [name] in Treatment: 14 Education Assessment Education Provided To: Patient Education Topics Provided Wound/Skin Impairment: Handouts: Caring for Your Ulcer Methods: Explain/Verbal Responses: Reinforcements needed Electronic Signature(s) Signed: 09/09/2022 12:41:16 PM By: Hailey Pilling RN, BSN Entered By: [name] on 09/09/2022 11:02:27 -------------------------------------------------------------------------------- Wound Assessment Details Patient Name: Date of Service: Hailey Courser, DO [name] 09/09/2022 10:30 A M Medical Record Number: [id number] Patient Account Number: [id number] Date of Birth/Sex: Treating RN: 10-28-1945 (77 y.o. [name] Primary Care Peola Joynt: [name] Other Clinician: Referring Loyalty Arentz: Treating Glenda Spelman/Extender: [name] in Treatment: 14 Wound Status Wound Number: 4 Primary Cellulitis Etiology: Wound Location: Left, Proximal, Anterior Lower Leg Wound Open Wounding Event: Gradually Appeared Status: Date Acquired: 04/19/2022 Comorbid Asthma, Sleep Apnea, Hypertension, Colitis, Rheumatoid Weeks Of Treatment: 14 History: Arthritis, Osteoarthritis Clustered Wound: No Photos Hailey, [name] ([id number]) [phone].pdf Page 7 of 10 Wound Measurements Length: (cm) 0.1 Width: (cm) 0.1 Depth: (cm) 0.1 Area: (cm) 0.008 Volume: (cm) 0.001 % Reduction in Area: 99.8% % Reduction in Volume: 99.9% Epithelialization: Large (67-100%) Tunneling: No Undermining: No Wound Description Classification: Full Thickness With Exposed Support Structures Wound Margin: Distinct, outline attached Exudate Amount: Medium Exudate Type: Serosanguineous Exudate Color: red, brown Foul Odor After Cleansing: No Slough/Fibrino Yes Wound Bed Granulation Amount: Large (67-100%) Exposed Structure Granulation Quality: Red Fascia Exposed:  No Necrotic Amount: None Present (0%) Fat Layer (Subcutaneous Tissue) Exposed: Yes Tendon Exposed: No Muscle Exposed: No Joint Exposed: No Bone Exposed: No  Periwound Skin Texture Texture Color No Abnormalities Noted: No No Abnormalities Noted: No Callus: No Atrophie Blanche: No Crepitus: No Cyanosis: No Excoriation: No Ecchymosis: No Induration: No Erythema: No Rash: No Hemosiderin Staining: No Scarring: No Mottled: No Pallor: No Moisture Rubor: No No Abnormalities Noted: No Dry / Scaly: No Temperature / Pain Maceration: No Temperature: No Abnormality Tenderness on Palpation: Yes Treatment Notes Wound #4 (Lower Leg) Wound Laterality: Left, Anterior, Proximal Cleanser Wound Cleanser Discharge Instruction: Cleanse the wound with wound cleanser prior to applying a clean dressing using gauze sponges, not tissue or cotton balls. Peri-Wound Care Topical Primary Dressing Puraply #3 Discharge Instruction: applied by [name]. Adaptic and steri-strips Discharge Instruction: Used to secure Puraply AM. Leave in place. Do not remove. Secondary Dressing ABD Pad, 5x9 Discharge Instruction: Apply over primary dressing as directed. Secured With Conforming Stretch Gauze Bandage Roll, Sterile 4x75 (in/in) Discharge Instruction: Secure with stretch gauze as directed. 62M Medipore H Soft Cloth Surgical T ape, 4 x 10 (in/yd) Discharge Instruction: Secure with tape as directed. Compression Wrap Compression Stockings Add-Ons Electronic Signature(s) Hailey, [name] ([id number]) [id number] Page 8 of 10 Signed: 09/09/2022 12:09:33 PM By: [name] Signed: 09/09/2022 12:41:16 PM By: Hailey Pilling RN, BSN Entered By: [name] on 09/09/2022 10:48:45 -------------------------------------------------------------------------------- Wound Assessment Details Patient Name: Date of Service: Hailey Courser, DO [name] 09/09/2022 10:30 A M Medical Record Number:  [id number] Patient Account Number: [id number] Date of Birth/Sex: Treating RN: April 01, 1946 (77 y.o. [name], [name] Primary Care Rayleigh Gillyard: [name] Other Clinician: Referring Nasif Bos: Treating Shell Yandow/Extender: [name] in Treatment: 14 Wound Status Wound Number: 5 Primary Cellulitis Etiology: Wound Location: Left, Distal, Anterior Lower Leg Wound Open Wounding Event: Gradually Appeared Status: Date Acquired: 04/19/2022 Comorbid Asthma, Sleep Apnea, Hypertension, Colitis, Rheumatoid Weeks Of Treatment: 14 History: Arthritis, Osteoarthritis Clustered Wound: No Photos Wound Measurements Length: (cm) 4.8 Width: (cm) 3 Depth: (cm) 0.1 Area: (cm) 11.31 Volume: (cm) 1.131 % Reduction in Area: 27.3% % Reduction in Volume: 27.3% Epithelialization: Small (1-33%) Tunneling: No Undermining: No Wound Description Classification: Full Thickness With Exposed Suppor Wound Margin: Distinct, outline attached Exudate Amount: Medium Exudate Type: Serosanguineous Exudate Color: red, brown t Structures Foul Odor After Cleansing: No Slough/Fibrino Yes Wound Bed Granulation Amount: Large (67-100%) Exposed Structure Granulation Quality: Pink, Pale Fascia Exposed: No Necrotic Amount: Small (1-33%) Fat Layer (Subcutaneous Tissue) Exposed: Yes Necrotic Quality: Adherent Slough Tendon Exposed: Yes Muscle Exposed: No Joint Exposed: No Bone Exposed: No Periwound Skin Texture Texture Color No Abnormalities Noted: No No Abnormalities Noted: No Callus: No Atrophie Blanche: No Crepitus: No Cyanosis: No Excoriation: No Ecchymosis: No Induration: No Erythema: No Rash: No Hemosiderin Staining: No ELISCIA, [name] ([id number]) [id number].pdf Page 9 of 10 Scarring: No Mottled: No Pallor: No Moisture Rubor: No No Abnormalities Noted: No Dry / Scaly: No Temperature / Pain Maceration: No Temperature: No Abnormality Tenderness  on Palpation: Yes Treatment Notes Wound #5 (Lower Leg) Wound Laterality: Left, Anterior, Distal Cleanser Wound Cleanser Discharge Instruction: Cleanse the wound with wound cleanser prior to applying a clean dressing using gauze sponges, not tissue or cotton balls. Peri-Wound Care Topical Primary Dressing Puraply #3 Discharge Instruction: applied by [name]. Adaptic and steri-strips Discharge Instruction: Used to secure Puraply AM. Leave in place. Do not remove. Secondary Dressing ABD Pad, 5x9 Discharge Instruction: Apply over primary dressing as directed. Secured With Conforming Stretch Gauze Bandage Roll, Sterile 4x75 (in/in) Discharge Instruction: Secure with stretch gauze as directed. 62M Medipore H Soft Cloth  Surgical T ape, 4 x 10 (in/yd) Discharge Instruction: Secure with tape as directed. Compression Wrap Compression Stockings Add-Ons Electronic Signature(s) Signed: 09/09/2022 12:09:33 PM By: [name] Signed: 09/09/2022 12:41:16 PM By: Hailey Pilling RN, BSN Entered By: [name] on 09/09/2022 10:49:12 -------------------------------------------------------------------------------- Vitals Details Patient Name: Date of Service: Hailey Courser, DO [name] 09/09/2022 10:30 A M Medical Record Number: [id number] Patient Account Number: [id number] Date of Birth/Sex: Treating RN: 1945/10/13 (77 y.o. F) Primary Care Margel Joens: [name] Other Clinician: Referring Eh Sauseda: Treating Mardelle Pandolfi/Extender: [name] in Treatment: 14 Vital Signs Time Taken: 10:42 Temperature (F): 98.6 Height (in): 61 Pulse (bpm): 98 Weight (lbs): 186 Respiratory Rate (breaths/min): 20 Body Mass Index (BMI): 35.1 Blood Pressure (mmHg): 127/65 Reference Range: 80 - 120 mg / dl Electronic Signature(s) Signed: 09/09/2022 12:09:33 PM By: [name] Entered By: [name] on 09/09/2022 10:43:15 [name] ([id number] Page 10 of 10

## 2022-09-10 NOTE — Progress Notes (Signed)
[name], [name] ([id number]) [id number] Page 1 of 12 Visit Report for 09/09/2022 Chief Complaint Document Details Patient Name: Date of Service: Hailey Courser, DO [name] 09/09/2022 10:30 A M Medical Record Number: [id number] Patient Account Number: [id number] Date of Birth/Sex: Treating RN: January 27, 1946 (77 y.o. F) Primary Care Provider: Lorene Dy Other Clinician: Referring Provider: Treating Provider/Extender: [name] in Treatment: 14 Information Obtained from: Patient Chief Complaint 06/03/2022; Left lower extremity wounds Electronic Signature(s) Signed: 09/09/2022 12:04:24 PM By: Kalman Shan DO Entered By: Kalman Shan on 09/09/2022 11:29:57 -------------------------------------------------------------------------------- Cellular or Tissue Based Product Details Patient Name: Date of Service: Hailey Courser, DO [name] 09/09/2022 10:30 A M Medical Record Number: [id number] Patient Account Number: [id number] Date of Birth/Sex: Treating RN: [birth year] (77 y.o. [name], [name] Primary Care Provider: Lorene Dy Other Clinician: Referring Provider: Treating Provider/Extender: [name] in Treatment: 14 Cellular or Tissue Based Product Type Wound #5 Left,Distal,Anterior Lower Leg Applied to: Performed By: Physician Kalman Shan, DO Cellular or Tissue Based Product Type: Puraply AM Level of Consciousness (Pre-procedure): Awake and Alert Pre-procedure Verification/Time Out Yes - 11:00 Taken: Location: trunk / arms / legs Wound Size (sq cm): 14.4 Product Size (sq cm): 20 Waste Size (sq cm): 5 Waste Reason: wound size Amount of Product Applied (sq cm): 15 Instrument Used: Forceps, Scissors Lot #: [id number] Order #: 3 Expiration Date: 11/05/2024 Fenestrated: No Reconstituted: Yes Solution Type: normal saline Solution Amount: 10 mL Lot #: [id number] Expiration Date:  12/15/2024 Secured: Yes Secured With: Steri-Strips, adaptic Dressing Applied: No Procedural Pain: 0 Post Procedural Pain: 0 Response to Treatment: Procedure was tolerated well Level of Consciousness (Post- Awake and [address] [name], [name] ([id number]) [id number] Page 2 of 12 procedure): Post Procedure Diagnosis Same as Pre-procedure Electronic Signature(s) Signed: 09/09/2022 12:04:24 PM By: Kalman Shan DO Signed: 09/09/2022 12:41:16 PM By: Deon Pilling RN, BSN Entered By: Deon Pilling on 09/09/2022 11:06:30 -------------------------------------------------------------------------------- Cellular or Tissue Based Product Details Patient Name: Date of Service: Hailey Courser, DO [name] 09/09/2022 10:30 A M Medical Record Number: [id number] Patient Account Number: [id number] Date of Birth/Sex: Treating RN: [birth year] (77 y.o. [name], [name] Primary Care Provider: Lorene Dy Other Clinician: Referring Provider: Treating Provider/Extender: [name] in Treatment: 14 Cellular or Tissue Based Product Type Wound #4 Left,Proximal,Anterior Lower Leg Applied to: Performed By: Physician Kalman Shan, DO Cellular or Tissue Based Product Type: Puraply AM Level of Consciousness (Pre-procedure): Awake and Alert Pre-procedure Verification/Time Out Yes - 11:00 Taken: Location: trunk / arms / legs Wound Size (sq cm): 0.01 Product Size (sq cm): 5 Waste Size (sq cm): 0 Amount of Product Applied (sq cm): 5 Instrument Used: Forceps, Scissors Lot #: [id number] Order #: 3 Expiration Date: 11/05/2024 Fenestrated: No Reconstituted: Yes Solution Type: normal saline Solution Amount: 10 mL Lot #: [id number] Solution Expiration Date: 12/15/2024 Secured: Yes Secured With: Steri-Strips, adaptic Dressing Applied: No Procedural Pain: 0 Post Procedural Pain: 0 Response to Treatment: Procedure was tolerated well Level of Consciousness  (Post- Awake and Alert procedure): Post Procedure Diagnosis Same as Pre-procedure Electronic Signature(s) Signed: 09/09/2022 12:04:24 PM By: Kalman Shan DO Signed: 09/09/2022 12:41:16 PM By: Deon Pilling RN, BSN Entered By: Deon Pilling on 09/09/2022 [id number] [name] ([id number]) [id number] Page 3 of 12 -------------------------------------------------------------------------------- Debridement Details Patient Name: Date of Service: Hailey Courser, DO [name] 09/09/2022 10:30 A M Medical Record Number: [id number] Patient Account Number: [id number] Date of  Birth/Sex: Treating RN: 18-Dec-1945 (77 y.o. [name], [name] Primary Care Provider: Lorene Dy Other Clinician: Referring Provider: Treating Provider/Extender: [name] in Treatment: 14 Debridement Performed for Assessment: Wound #5 Left,Distal,Anterior Lower Leg Performed By: Physician Kalman Shan, DO Debridement Type: Debridement Level of Consciousness (Pre-procedure): Awake and Alert Pre-procedure Verification/Time Out Yes - 10:55 Taken: Start Time: 10:56 Pain Control: Lidocaine 5% topical ointment T Area Debrided (L x W): otal 4.8 (cm) x 3 (cm) = 14.4 (cm) Tissue and other material debrided: Viable, Non-Viable, Slough, Subcutaneous, Slough Level: Skin/Subcutaneous Tissue Debridement Description: Excisional Instrument: Curette Bleeding: Minimum Hemostasis Achieved: Pressure End Time: 10:59 Procedural Pain: 0 Post Procedural Pain: 0 Response to Treatment: Procedure was tolerated well Level of Consciousness (Post- Awake and Alert procedure): Post Debridement Measurements of Total Wound Length: (cm) 4.8 Width: (cm) 3 Depth: (cm) 0.1 Volume: (cm) 1.131 Character of Wound/Ulcer Post Debridement: Improved Post Procedure Diagnosis Same as Pre-procedure Electronic Signature(s) Signed: 09/09/2022 12:04:24 PM By: Kalman Shan DO Signed:  09/09/2022 12:41:16 PM By: Deon Pilling RN, BSN Entered By: Deon Pilling on 09/09/2022 11:03:23 -------------------------------------------------------------------------------- HPI Details Patient Name: Date of Service: Hailey Courser, DO [name] 09/09/2022 10:30 A M Medical Record Number: [id number] Patient Account Number: [id number] Date of Birth/Sex: Treating RN: 1946/05/10 (77 y.o. F) Primary Care Provider: Lorene Dy Other Clinician: Referring Provider: Treating Provider/Extender: [name] in Treatment: 14 History of Present Illness HPI Description: Patient presents today for initial evaluation and our clinic as a referral from the [facility] health system Department of orthopedics for evaluation and treatment of the wound to his his of the left foot at the base of the great toe. Currently the good news is the patient really does not have any significant pain which is excellent. She has been seen by Dr. Linus Salmons at [facility]: infectious disease clinic that is the regional Center for infectious disease. Subsequently the patient was cultured and positive for methicillin sensitive staph aureus. She has been on antibiotics including Cipro prior to surgery as will several days following surgery. She then had clindamycin which was changed to doxycycline. She took this for 10 days. Subsequently the pain had improved and there was no longer any possible draining from the wound according to the notes. The patient sedentary rate as well as C-reactive protein have returned to normal ranges. Currently per Dr. Henreitta Leber assessment there was one dehiscence with no sign of osteomyelitis. He therefore discontinue the antibiotics with the completion of the last three days Of doxycycline. Other than that he just set her for a follow-up as needed. The patient does not have diabetes and is not a current smoker. At this time her treatment that was recommended by the surgeon was applying  a Hydrocolloid dressing. Based on what I'm seeing at this point the patient actually has some Slough noted over the surface of the wound there really does not appear to be evidence of infection though I do think she does require some sharp debridement to clear away the slough and help with appropriate wound healing. She does have areas of granulation buds noted. She may be RANETTA, [name] ([id number]) [id number] Page 4 of 12 a candidate for a skin substitute as well. We will see how things do over the next period of time until follow-up. No fevers, chills, nausea, or vomiting noted at this time. 06/13/18 on evaluation today patient actually appears to be doing better in regard to her toe ulcer. She's been using  the Prisma on this region and that seems to have done extremely well for her. Fortunately there does not appear to be evidence of infection at this time which is good news. She did see her surgeon they were extremely pleased with the overall appearance of her wound. 06/27/18 on evaluation today patient actually appears to be doing very well in regard to her foot ulcer. She has been tolerating the dressing changes without complication which is great news. She did see her podiatrist they felt like everything is looking very nice as well and will please. 07/05/2018 surgical wound on the left foot. She appears to be doing well. Still surface debrided to remove however in general post debridement the wound bed looks quite healthy it is come down significantly in terms of dimensions using silver collagen. The patient describes pain when her foot is elevated either in bed at night [sometimes keeps her awake] or when is propped up on a foot rest. She basically takes analgesics for this. She does not really describe claudication with activity however her activity is very limited by interstitial lung disease. Her ABIs initially in this clinic were noncompressible. She is not a  diabetic 07/20/2018; surgical wound on the left foot. Wound actually looks quite a bit better than I remember seeing this. She is still describing pain with her leg elevated at night that she does not get when the wound is supine. She does not really describe claudication but she is very limited by her pulmonary status. We have been using silver collagen. 1/17; the patient has been followed up by orthopedics and discharge. Her arterial studies were actually quite good and should not be contributing to any pain. Slight reduction in ABIs but otherwise normal. Her wound is closed. She saw Dr. Linus Salmons of infectious disease surrounding the surgery. By review of our notes she was not felt to have osteomyelitis. 06/03/2022 Ms. Alanys Heise is a 77 year old female with a past medical history of interstitial lung disease on chronic oxygen via nasal cannula, rheumatoid arthritis, chronic diastolic heart failure and OSA that presents to the clinic for a 1 to 24-monthhistory of nonhealing ulcer to the left lower extremity. On 04/20/2022 she was admitted to the hospital for left lower extremity cellulitis. She required 8 days of IV vancomycin and Unasyn followed by a 14-day course of Augmentin and doxycycline. She has been using Silvadene cream to the area. She does not use compression therapy but does own compression stockings. She currently denies signs of infection. 11/27; patient presents for follow-up she is using Medihoney and Hydrofera Blue to the wound beds. She has been using her Tubigrip. She has no issues or complaints today. 12/4; patient presents for follow-up. She continues to use Medihoney and Hydrofera Blue to the wound beds. Several attempts have been made to schedule ABIs with TBI's. We gave patient the number today to call to have these scheduled. She has been using Tubigrip. She has no issues or complaints today. 12/18; she continues to use Medihoney and Hydrofera Blue in general the surface of  the wound is looking somewhat better this week and measurements are slightly smaller. She did have her arterial studies done these were noncompressible bilaterally however TBI on the right of 0.69 on the left 0.62. Waveforms on the right were triphasic on the left biphasic to triphasic. This does not suggest significant arterial disease to make healing wounds at this location difficult. 12/29; patient presents for follow-up. She has been using Medihoney and [employer]  to the wound bed. She has been using Tubigrip. 1/5; patient presents for follow-up. She is been using Medihoney and Hydrofera Blue to the wound bed along with Tubigrip. The Tubigrip was doubled at last clinic visit. There is been improvement in wound healing. 1/12; patient presents for follow-up. She has been using Medihoney and Hydrofera Blue to the wound bed along with Tubigrip. There continues to be improvement in wound healing. 1/19; patient presents for follow-up. She has been using Medihoney and Hydrofera Blue to the wound bed along with her juxta light compression daily. There is been improvement in wound measurements. Patient has no issues or complaints today. 1/25; patient presents for follow-up. She has been using blast X and collagen to the wound bed under juxta light compression daily. She has no issues or complaints today. There is been improvement in wound healing. 2/1; patient presents for follow-up. She has been using blast X and collagen to the wound bed under juxta light compression daily. She has no issues or complaints today. 2/9; patient presents for follow-up. She has been using Hydrofera Blue with blast X under compression wrap daily. She has been approved for a skin substitute and patient would like to proceed with this. 2/15; patient presents for follow-up. PuraPly was placed in standard fashion to the large anterior leg wound. She has been using blast X and Hydrofera Blue to the more proximal small wound.  She has been using her juxta light compression daily. She has no issues or complaints today. 2/23; patient presents for follow-up. PuraPly was placed in standard fashion to the large anterior leg wound. The more proximal small wound is almost healed and they have been using blast X and Hydrofera Blue. She has been using her juxta light compression daily to the left lower extremity. There is been improvement in wound healing. Electronic Signature(s) Signed: 09/09/2022 12:04:24 PM By: Kalman Shan DO Entered By: Kalman Shan on 09/09/2022 11:30:50 -------------------------------------------------------------------------------- Physical Exam Details Patient Name: Date of Service: Hailey Courser, DO [name] 09/09/2022 10:30 A M Medical Record Number: [id number] Patient Account Number: [id number] Date of Birth/Sex: Treating RN: [birth year] (77 y.o. F) Primary Care Provider: Lorene Dy Other Clinician: Referring Provider: Treating Provider/Extender: [name] in Treatment: [address], [place] ([id number]) [id number] Page 5 of 12 Constitutional respirations regular, non-labored and within target range for patient.Marland Kitchen Psychiatric pleasant and cooperative. Notes T the lateral aspect of the left lower extremity there are 2 open wounds. The larger wound has slough and granulation tissue present. The small proximal wound o is almost healed with a small area of granulation present. Good edema control. Obvious deformity to the foot from previous surgeries. No signs of surrounding soft tissue infection. Electronic Signature(s) Signed: 09/09/2022 12:04:24 PM By: Kalman Shan DO Entered By: Kalman Shan on 09/09/2022 11:34:07 -------------------------------------------------------------------------------- Physician Orders Details Patient Name: Date of Service: Hailey Courser, DO [name] 09/09/2022 10:30 A M Medical Record Number:  [id number] Patient Account Number: [id number] Date of Birth/Sex: Treating RN: 03/17/1946 (77 y.o. [name], [name] Primary Care Provider: Lorene Dy Other Clinician: Referring Provider: Treating Provider/Extender: [name] in Treatment: [phone] Verbal / Phone Orders: No Diagnosis Coding ICD-10 Coding Code Description [phone] Non-pressure chronic ulcer of other part of left lower leg with other specified severity I87.312 Chronic venous hypertension (idiopathic) with ulcer of left lower extremity J84.9 Interstitial pulmonary disease, unspecified M06.9 Rheumatoid arthritis, unspecified Follow-up Appointments ppointment in 1 week. - Dr. Heber [place] 09/15/2022 Thursday  1245pm Return A ppointment in 2 weeks. - Dr. Heber [place] 09/23/2022 1015 Friday Return A Anesthetic (In clinic) Topical Lidocaine 5% applied to wound bed Cellular or Tissue Based Products Wound #5 Left,Distal,Anterior Lower Leg Cellular or Tissue Based Product Type: - 08/18/2022 insurance ran organogenesis- 100% covered. 08/26/2022 #1 Puraply AM applied. 09/01/2022 #2 Puraply AM applied. 09/09/2022 #3 Puraply AM applied to both wounds. Cellular or Tissue Based Product applied to wound bed, secured with steri-strips, cover with Adaptic or Mepitel. (DO NOT REMOVE). Bathing/ Shower/ Hygiene May shower with protection but do not get wound dressing(s) wet. Protect dressing(s) with water repellant cover (for example, large plastic bag) or a cast cover and may then take shower. Edema Control - Lymphedema / SCD / Other Elevate legs to the level of the heart or above for 30 minutes daily and/or when sitting for 3-4 times a day throughout the day. Avoid standing for long periods of time. Exercise regularly Compression stocking or Garment 20-30 mm/Hg pressure to: - start wearing the juxtalite HD apply in the morning and remove at night left leg. Non Wound Condition Protect area with: - buttock area with AandD ointment  daily Wound Treatment Wound #4 - Lower Leg Wound Laterality: Left, Anterior, Proximal Cleanser: Wound Cleanser (Generic) 1 x Per Week/30 Days Discharge Instructions: Cleanse the wound with wound cleanser prior to applying a clean dressing using gauze sponges, not tissue or cotton balls. [name], [name] ([id number]) [id number] Page 6 of 12 Prim Dressing: Puraply #3 1 x Per Week/30 Days ary Discharge Instructions: applied by provider. Prim Dressing: Adaptic and steri-strips 1 x Per Week/30 Days ary Discharge Instructions: Used to secure Puraply AM. Leave in place. Do not remove. Secondary Dressing: ABD Pad, 5x9 (Generic) 1 x Per Week/30 Days Discharge Instructions: Apply over primary dressing as directed. Secured With: Scientist, forensic, Sterile 4x75 (in/in) (Generic) 1 x Per Week/30 Days Discharge Instructions: Secure with stretch gauze as directed. Secured With: 67M Medipore H Soft Cloth Surgical T ape, 4 x 10 (in/yd) (Generic) 1 x Per Week/30 Days Discharge Instructions: Secure with tape as directed. Wound #5 - Lower Leg Wound Laterality: Left, Anterior, Distal Cleanser: Wound Cleanser (Generic) 1 x Per Week/30 Days Discharge Instructions: Cleanse the wound with wound cleanser prior to applying a clean dressing using gauze sponges, not tissue or cotton balls. Prim Dressing: Puraply #3 1 x Per Week/30 Days ary Discharge Instructions: applied by provider. Prim Dressing: Adaptic and steri-strips 1 x Per Week/30 Days ary Discharge Instructions: Used to secure Puraply AM. Leave in place. Do not remove. Secondary Dressing: ABD Pad, 5x9 (Generic) 1 x Per Week/30 Days Discharge Instructions: Apply over primary dressing as directed. Secured With: Scientist, forensic, Sterile 4x75 (in/in) (Generic) 1 x Per Week/30 Days Discharge Instructions: Secure with stretch gauze as directed. Secured With: 67M Medipore H Soft Cloth Surgical T  ape, 4 x 10 (in/yd) (Generic) 1 x Per Week/30 Days Discharge Instructions: Secure with tape as directed. Electronic Signature(s) Signed: 09/09/2022 12:04:24 PM By: Kalman Shan DO Entered By: Kalman Shan on 09/09/2022 11:34:18 -------------------------------------------------------------------------------- Problem List Details Patient Name: Date of Service: Hailey Courser, DO [name] 09/09/2022 10:30 A M Medical Record Number: [id number] Patient Account Number: [id number] Date of Birth/Sex: Treating RN: 1945/10/25 (77 y.o. [name] Primary Care Provider: Lorene Dy Other Clinician: Referring Provider: Treating Provider/Extender: [name] in Treatment: 14 Active Problems ICD-10 Encounter Code Description Active Date MDM Diagnosis L97.828 Non-pressure chronic  ulcer of other part of left lower leg with other specified 06/03/2022 No Yes severity I87.312 Chronic venous hypertension (idiopathic) with ulcer of left lower extremity 06/03/2022 No Yes J84.9 Interstitial pulmonary disease, unspecified 06/03/2022 No Yes [name], [name] ([id number]) [phone].pdf Page 7 of 12 M06.9 Rheumatoid arthritis, unspecified 06/03/2022 No Yes Inactive Problems Resolved Problems Electronic Signature(s) Signed: 09/09/2022 12:04:24 PM By: Kalman Shan DO Entered By: Kalman Shan on 09/09/2022 11:28:55 -------------------------------------------------------------------------------- Progress Note Details Patient Name: Date of Service: [name], DO [name] 09/09/2022 10:30 A M Medical Record Number: [id number] Patient Account Number: [id number] Date of Birth/Sex: Treating RN: Dec 19, 1945 (77 y.o. F) Primary Care Provider: Lorene Dy Other Clinician: Referring Provider: Treating Provider/Extender: [name] in Treatment: 14 Subjective Chief Complaint Information obtained from Patient 06/03/2022;  Left lower extremity wounds History of Present Illness (HPI) Patient presents today for initial evaluation and our clinic as a referral from the [facility] health system Department of orthopedics for evaluation and treatment of the wound to his his of the left foot at the base of the great toe. Currently the good news is the patient really does not have any significant pain which is excellent. She has been seen by Dr. Linus Salmons at [facility]: infectious disease clinic that is the regional Center for infectious disease. Subsequently the patient was cultured and positive for methicillin sensitive staph aureus. She has been on antibiotics including Cipro prior to surgery as will several days following surgery. She then had clindamycin which was changed to doxycycline. She took this for 10 days. Subsequently the pain had improved and there was no longer any possible draining from the wound according to the notes. The patient sedentary rate as well as C-reactive protein have returned to normal ranges. Currently per Dr. Henreitta Leber assessment there was one dehiscence with no sign of osteomyelitis. He therefore discontinue the antibiotics with the completion of the last three days Of doxycycline. Other than that he just set her for a follow-up as needed. The patient does not have diabetes and is not a current smoker. At this time her treatment that was recommended by the surgeon was applying a Hydrocolloid dressing. Based on what I'm seeing at this point the patient actually has some Slough noted over the surface of the wound there really does not appear to be evidence of infection though I do think she does require some sharp debridement to clear away the slough and help with appropriate wound healing. She does have areas of granulation buds noted. She may be a candidate for a skin substitute as well. We will see how things do over the next period of time until follow-up. No fevers, chills, nausea, or vomiting noted  at this time. 06/13/18 on evaluation today patient actually appears to be doing better in regard to her toe ulcer. She's been using the Prisma on this region and that seems to have done extremely well for her. Fortunately there does not appear to be evidence of infection at this time which is good news. She did see her surgeon they were extremely pleased with the overall appearance of her wound. 06/27/18 on evaluation today patient actually appears to be doing very well in regard to her foot ulcer. She has been tolerating the dressing changes without complication which is great news. She did see her podiatrist they felt like everything is looking very nice as well and will please. 07/05/2018 surgical wound on the left foot. She appears to be doing well. Still  surface debrided to remove however in general post debridement the wound bed looks quite healthy it is come down significantly in terms of dimensions using silver collagen. The patient describes pain when her foot is elevated either in bed at night [sometimes keeps her awake] or when is propped up on a foot rest. She basically takes analgesics for this. She does not really describe claudication with activity however her activity is very limited by interstitial lung disease. Her ABIs initially in this clinic were noncompressible. She is not a diabetic 07/20/2018; surgical wound on the left foot. Wound actually looks quite a bit better than I remember seeing this. She is still describing pain with her leg elevated at night that she does not get when the wound is supine. She does not really describe claudication but she is very limited by her pulmonary status. We have been using silver collagen. 1/17; the patient has been followed up by orthopedics and discharge. Her arterial studies were actually quite good and should not be contributing to any pain. Slight reduction in ABIs but otherwise normal. Her wound is closed. She saw Dr. Linus Salmons of infectious  disease surrounding the surgery. By review of our notes she was not felt to have osteomyelitis. 06/03/2022 Ms. [name] is a 77 year old female with a past medical history of interstitial lung disease on chronic oxygen via nasal cannula, rheumatoid arthritis, chronic diastolic heart failure and OSA that presents to the clinic for a 1 to 90-monthhistory of nonhealing ulcer to the left lower extremity. On 04/20/2022 she was admitted to the hospital for left lower extremity cellulitis. She required 8 days of IV vancomycin and Unasyn followed by a 14-day course of Augmentin and doxycycline. She has been using Silvadene cream to the area. She does not use compression therapy but does own compression stockings. She currently denies signs of infection. 11/27; patient presents for follow-up she is using Medihoney and Hydrofera Blue to the wound beds. She has been using her Tubigrip. She has no issues or complaints today. 12/4; patient presents for follow-up. She continues to use Medihoney and Hydrofera Blue to the wound beds. Several attempts have been made to schedule ABIs with TBI's. We gave patient the number today to call to have these scheduled. She has been using Tubigrip. She has no issues or complaints today. CJAEDYN, LEIGHT([id number] Page 8 of 12 12/18; she continues to use Medihoney and Hydrofera Blue in general the surface of the wound is looking somewhat better this week and measurements are slightly smaller. She did have her arterial studies done these were noncompressible bilaterally however TBI on the right of 0.69 on the left 0.62. Waveforms on the right were triphasic on the left biphasic to triphasic. This does not suggest significant arterial disease to make healing wounds at this location difficult. 12/29; patient presents for follow-up. She has been using Medihoney and Hydrofera Blue to the wound bed. She has been using Tubigrip. 1/5; patient  presents for follow-up. She is been using Medihoney and Hydrofera Blue to the wound bed along with Tubigrip. The Tubigrip was doubled at last clinic visit. There is been improvement in wound healing. 1/12; patient presents for follow-up. She has been using Medihoney and Hydrofera Blue to the wound bed along with Tubigrip. There continues to be improvement in wound healing. 1/19; patient presents for follow-up. She has been using Medihoney and Hydrofera Blue to the wound bed along with her juxta light compression daily. There is been improvement in wound  measurements. Patient has no issues or complaints today. 1/25; patient presents for follow-up. She has been using blast X and collagen to the wound bed under juxta light compression daily. She has no issues or complaints today. There is been improvement in wound healing. 2/1; patient presents for follow-up. She has been using blast X and collagen to the wound bed under juxta light compression daily. She has no issues or complaints today. 2/9; patient presents for follow-up. She has been using Hydrofera Blue with blast X under compression wrap daily. She has been approved for a skin substitute and patient would like to proceed with this. 2/15; patient presents for follow-up. PuraPly was placed in standard fashion to the large anterior leg wound. She has been using blast X and Hydrofera Blue to the more proximal small wound. She has been using her juxta light compression daily. She has no issues or complaints today. 2/23; patient presents for follow-up. PuraPly was placed in standard fashion to the large anterior leg wound. The more proximal small wound is almost healed and they have been using blast X and Hydrofera Blue. She has been using her juxta light compression daily to the left lower extremity. There is been improvement in wound healing. Patient History Information obtained from Patient. Family History Cancer - Maternal Grandparents,  Diabetes - Siblings, Heart Disease - Maternal Grandparents,Paternal Grandparents,Mother,Father,Siblings, Hypertension - Mother,Father,Siblings, Stroke - Paternal Grandparents, No family history of Hereditary Spherocytosis, Kidney Disease, Lung Disease, Seizures, Thyroid Problems, Tuberculosis. Social History Never smoker, Marital Status - Married, Alcohol Use - Moderate, Drug Use - No History, Caffeine Use - Moderate. Medical History Eyes Denies history of Cataracts, Optic Neuritis Ear/Nose/Mouth/Throat Denies history of Chronic sinus problems/congestion Hematologic/Lymphatic Denies history of Anemia, Hemophilia, Human Immunodeficiency Virus, Lymphedema, Sickle Cell Disease Respiratory Patient has history of Asthma, Sleep Apnea Denies history of Aspiration, Chronic Obstructive Pulmonary Disease (COPD), Pneumothorax, Tuberculosis Cardiovascular Patient has history of Hypertension Denies history of Angina, Arrhythmia, Congestive Heart Failure, Coronary Artery Disease, Deep Vein Thrombosis, Hypotension, Myocardial Infarction, Peripheral Arterial Disease, Peripheral Venous Disease, Phlebitis, Vasculitis Gastrointestinal Patient has history of Colitis Denies history of Cirrhosis , Crohnoos, Hepatitis A, Hepatitis B, Hepatitis C Endocrine Denies history of Type I Diabetes, Type II Diabetes Immunological Denies history of Lupus Erythematosus, Raynaudoos, Scleroderma Integumentary (Skin) Denies history of History of Burn Musculoskeletal Patient has history of Rheumatoid Arthritis, Osteoarthritis Denies history of Gout, Osteomyelitis Neurologic Denies history of Dementia, Neuropathy, Quadriplegia, Paraplegia, Seizure Disorder Oncologic Denies history of Received Chemotherapy, Received Radiation Psychiatric Denies history of Anorexia/bulimia, Confinement Anxiety Hospitalization/Surgery History - surgery toe. - right hip replacement 12/2018. - heart cath 12/17/2021. Medical A Surgical  History Notes nd Respiratory 4L [place] 02 pulmonary fibrosis [name], [name] ([id number]) [phone].pdf Page 9 of 12 Objective Constitutional respirations regular, non-labored and within target range for patient.. Vitals Time Taken: 10:42 AM, Height: 61 in, Weight: 186 lbs, BMI: 35.1, Temperature: 98.6 F, Pulse: 98 bpm, Respiratory Rate: 20 breaths/min, Blood Pressure: 127/65 mmHg. Psychiatric pleasant and cooperative. General Notes: T the lateral aspect of the left lower extremity there are 2 open wounds. The larger wound has slough and granulation tissue present. The small o proximal wound is almost healed with a small area of granulation present. Good edema control. Obvious deformity to the foot from previous surgeries. No signs of surrounding soft tissue infection. Integumentary (Hair, Skin) Wound #4 status is Open. Original cause of wound was Gradually Appeared. The date acquired was: 04/19/2022. The wound has been in treatment 14 weeks.  The wound is located on the Left,Proximal,Anterior Lower Leg. The wound measures 0.1cm length x 0.1cm width x 0.1cm depth; 0.008cm^2 area and 0.001cm^3 volume. There is Fat Layer (Subcutaneous Tissue) exposed. There is no tunneling or undermining noted. There is a medium amount of serosanguineous drainage noted. The wound margin is distinct with the outline attached to the wound base. There is large (67-100%) red granulation within the wound bed. There is no necrotic tissue within the wound bed. The periwound skin appearance did not exhibit: Callus, Crepitus, Excoriation, Induration, Rash, Scarring, Dry/Scaly, Maceration, Atrophie Blanche, Cyanosis, Ecchymosis, Hemosiderin Staining, Mottled, Pallor, Rubor, Erythema. Periwound temperature was noted as No Abnormality. The periwound has tenderness on palpation. Wound #5 status is Open. Original cause of wound was Gradually Appeared. The date acquired was: 04/19/2022. The wound has been  in treatment 14 weeks. The wound is located on the [facility] Lower Leg. The wound measures 4.8cm length x 3cm width x 0.1cm depth; 11.31cm^2 area and 1.131cm^3 volume. There is tendon and Fat Layer (Subcutaneous Tissue) exposed. There is no tunneling or undermining noted. There is a medium amount of serosanguineous drainage noted. The wound margin is distinct with the outline attached to the wound base. There is large (67-100%) pink, pale granulation within the wound bed. There is a small (1-33%) amount of necrotic tissue within the wound bed including Adherent Slough. The periwound skin appearance did not exhibit: Callus, Crepitus, Excoriation, Induration, Rash, Scarring, Dry/Scaly, Maceration, Atrophie Blanche, Cyanosis, Ecchymosis, Hemosiderin Staining, Mottled, Pallor, Rubor, Erythema. Periwound temperature was noted as No Abnormality. The periwound has tenderness on palpation. 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 PuraPly was placed in standard fashion to the wound beds. I recommended continuing daily compression juxta lite wraps. Follow-up in 1 week. Procedures Wound #5 Pre-procedure diagnosis of Wound #5 is a Cellulitis located on the Left,Distal,Anterior Lower Leg . There was a Excisional Skin/Subcutaneous Tissue Debridement with a total area of 14.4 sq cm performed by Kalman Shan, DO. With the following instrument(s): Curette to remove Viable and Non-Viable tissue/material. Material removed includes Subcutaneous Tissue and Slough and after achieving pain control using Lidocaine 5% topical ointment. A time out was conducted at 10:55, prior to the start of the procedure. A Minimum amount of bleeding  was controlled with Pressure. The procedure was tolerated well with a pain level of 0 throughout and a pain level of 0 following the procedure. Post Debridement Measurements: 4.8cm length x 3cm width x 0.1cm depth; 1.131cm^3 volume. Character of Wound/Ulcer Post Debridement is improved. Post procedure Diagnosis Wound #5: Same as Pre-Procedure Pre-procedure diagnosis of Wound #5 is a Cellulitis located on the Left,Distal,Anterior Lower Leg. A skin graft procedure using a bioengineered skin substitute/cellular or tissue based product was performed by Kalman Shan, DO with the following instrument(s): Forceps and Scissors. Puraply AM was applied and secured with Steri-Strips and adaptic. 15 sq cm of product was utilized and 5 sq cm was wasted due to wound size. Post Application, no dressing was applied. A Time Out was conducted at 11:00, prior to the start of the procedure. The procedure was tolerated well with a pain level of 0 throughout and a pain level of 0 following the procedure. Post procedure Diagnosis Wound #5: Same as Pre-Procedure . Wound #4 Pre-procedure diagnosis of Wound #4 is a Cellulitis located  on the Left,Proximal,Anterior Lower Leg. A skin graft procedure using a bioengineered skin substitute/cellular or tissue based product was performed by Kalman Shan, DO with the following instrument(s): Forceps and Scissors. Puraply AM was applied and secured with Steri-Strips and adaptic. 5 sq cm of product was utilized and 0 sq cm was wasted. Post Application, no dressing was applied. A Time Out was conducted at 11:00, prior to the start of the procedure. The procedure was tolerated well with a pain level of 0 throughout and a pain level of 0 following the procedure. Post procedure Diagnosis Wound #4: Same as Pre-Procedure . [name], [name] ([id number]) [id number] Page 10 of 12 Plan Follow-up Appointments: Return Appointment in 1 week. - Dr. Heber [place]  09/15/2022 Thursday 1245pm Return Appointment in 2 weeks. - Dr. Heber [place] 09/23/2022 1015 Friday Anesthetic: (In clinic) Topical Lidocaine 5% applied to wound bed Cellular or Tissue Based Products: Wound #5 Left,Distal,Anterior Lower Leg: Cellular or Tissue Based Product Type: - 08/18/2022 insurance ran organogenesis- 100% covered. 08/26/2022 #1 Puraply AM applied. 09/01/2022 #2 Puraply AM applied. 09/09/2022 #3 Puraply AM applied to both wounds. Cellular or Tissue Based Product applied to wound bed, secured with steri-strips, cover with Adaptic or Mepitel. (DO NOT REMOVE). Bathing/ Shower/ Hygiene: May shower with protection but do not get wound dressing(s) wet. Protect dressing(s) with water repellant cover (for example, large plastic bag) or a cast cover and may then take shower. Edema Control - Lymphedema / SCD / Other: Elevate legs to the level of the heart or above for 30 minutes daily and/or when sitting for 3-4 times a day throughout the day. Avoid standing for long periods of time. Exercise regularly Compression stocking or Garment 20-30 mm/Hg pressure to: - start wearing the juxtalite HD apply in the morning and remove at night left leg. Non Wound Condition: Protect area with: - buttock area with AandD ointment daily WOUND #4: - Lower Leg Wound Laterality: Left, Anterior, Proximal Cleanser: Wound Cleanser (Generic) 1 x Per Week/30 Days Discharge Instructions: Cleanse the wound with wound cleanser prior to applying a clean dressing using gauze sponges, not tissue or cotton balls. Prim Dressing: Puraply #3 1 x Per Week/30 Days ary Discharge Instructions: applied by provider. Prim Dressing: Adaptic and steri-strips 1 x Per Week/30 Days ary Discharge Instructions: Used to secure Puraply AM. Leave in place. Do not remove. Secondary Dressing: ABD Pad, 5x9 (Generic) 1 x Per Week/30 Days Discharge Instructions: Apply over primary dressing as directed. Secured With: Production assistant, radio, Sterile 4x75 (in/in) (Generic) 1 x Per Week/30 Days Discharge Instructions: Secure with stretch gauze as directed. Secured With: 34M Medipore H Soft Cloth Surgical T ape, 4 x 10 (in/yd) (Generic) 1 x Per Week/30 Days Discharge Instructions: Secure with tape as directed. WOUND #5: - Lower Leg Wound Laterality: Left, Anterior, Distal Cleanser: Wound Cleanser (Generic) 1 x Per Week/30 Days Discharge Instructions: Cleanse the wound with wound cleanser prior to applying a clean dressing using gauze sponges, not tissue or cotton balls. Prim Dressing: Puraply #3 1 x Per Week/30 Days ary Discharge Instructions: applied by provider. Prim Dressing: Adaptic and steri-strips 1 x Per Week/30 Days ary Discharge Instructions: Used to secure Puraply AM. Leave in place. Do not remove. Secondary Dressing: ABD Pad, 5x9 (Generic) 1 x Per Week/30 Days Discharge Instructions: Apply over primary dressing as directed. Secured With: Scientist, forensic, Sterile 4x75 (in/in) (Generic) 1 x Per Week/30 Days Discharge Instructions: Secure with stretch gauze as directed. Secured With: [employer]  Medipore H Soft Cloth Surgical T ape, 4 x 10 (in/yd) (Generic) 1 x Per Week/30 Days Discharge Instructions: Secure with tape as directed. 1. In office sharp debridement 2. PuraPly was placed in standard fashion 3. Juxta light compression daily Electronic Signature(s) Signed: 09/09/2022 12:04:24 PM By: Kalman Shan DO Entered By: Kalman Shan on 09/09/2022 11:36:27 -------------------------------------------------------------------------------- HxROS Details Patient Name: Date of Service: Hailey Courser, DO [name] 09/09/2022 10:30 A M Medical Record Number: [id number] Patient Account Number: [id number] Date of Birth/Sex: Treating RN: 08/18/1945 (77 y.o. F) Primary Care Provider: Lorene Dy Other Clinician: Referring Provider: Treating Provider/Extender: [name]  in Treatment: [place] From Patient [address] KARLIE, SAKER ([id number]) [id number] Page 11 of 12 Medical History: Negative for: Cataracts; Optic Neuritis Ear/Nose/Mouth/Throat Medical History: Negative for: Chronic sinus problems/congestion Hematologic/Lymphatic Medical History: Negative for: Anemia; Hemophilia; Human Immunodeficiency Virus; Lymphedema; Sickle Cell Disease Respiratory Medical History: Positive for: Asthma; Sleep Apnea Negative for: Aspiration; Chronic Obstructive Pulmonary Disease (COPD); Pneumothorax; Tuberculosis Past Medical History Notes: 4L [place] 02 pulmonary fibrosis Cardiovascular Medical History: Positive for: Hypertension Negative for: Angina; Arrhythmia; Congestive Heart Failure; Coronary Artery Disease; Deep Vein Thrombosis; Hypotension; Myocardial Infarction; Peripheral Arterial Disease; Peripheral Venous Disease; Phlebitis; Vasculitis Gastrointestinal Medical History: Positive for: Colitis Negative for: Cirrhosis ; Crohns; Hepatitis A; Hepatitis B; Hepatitis C Endocrine Medical History: Negative for: Type I Diabetes; Type II Diabetes Immunological Medical History: Negative for: Lupus Erythematosus; Raynauds; Scleroderma Integumentary (Skin) Medical History: Negative for: History of Burn Musculoskeletal Medical History: Positive for: Rheumatoid Arthritis; Osteoarthritis Negative for: Gout; Osteomyelitis Neurologic Medical History: Negative for: Dementia; Neuropathy; Quadriplegia; Paraplegia; Seizure Disorder Oncologic Medical History: Negative for: Received Chemotherapy; Received Radiation Psychiatric Medical History: Negative for: Anorexia/bulimia; Confinement Anxiety Immunizations Pneumococcal Vaccine: Received Pneumococcal Vaccination: Yes Received Pneumococcal Vaccination On or After 60th Birthday: Yes Implantable Devices No devices added [name], [name] ([id number])  [phone].pdf Page 12 of 12 Hospitalization / Surgery History Type of Hospitalization/Surgery surgery toe right hip replacement 12/2018 heart cath 12/17/2021 Family and Social History Cancer: Yes - Maternal Grandparents; Diabetes: Yes - Siblings; Heart Disease: Yes - Maternal Grandparents,Paternal Grandparents,Mother,Father,Siblings; Hereditary Spherocytosis: No; Hypertension: Yes - Mother,Father,Siblings; Kidney Disease: No; Lung Disease: No; Seizures: No; Stroke: Yes - Paternal Grandparents; Thyroid Problems: No; Tuberculosis: No; Never smoker; Marital Status - Married; Alcohol Use: Moderate; Drug Use: No History; Caffeine Use: Moderate; Financial Concerns: No; Food, Clothing or Shelter Needs: No; Support System Lacking: No; Transportation Concerns: No Electronic Signature(s) Signed: 09/09/2022 12:04:24 PM By: Kalman Shan DO Entered By: Kalman Shan on 09/09/2022 11:30:55 -------------------------------------------------------------------------------- SuperBill Details Patient Name: Date of Service: [name], DO [place]. 09/09/2022 Medical Record Number: [id number] Patient Account Number: [id number] Date of Birth/Sex: Treating RN: 14-Dec-1945 (77 y.o. [name] Primary Care Provider: Lorene Dy Other Clinician: Referring Provider: Treating Provider/Extender: [name] in Treatment: 14 Diagnosis Coding ICD-10 Codes Code Description [phone] Non-pressure chronic ulcer of other part of left lower leg with other specified severity I87.312 Chronic venous hypertension (idiopathic) with ulcer of left lower extremity J84.9 Interstitial pulmonary disease, unspecified M06.9 Rheumatoid arthritis, unspecified Facility Procedures : CPT4 Code: [id number] Description: [id number] - PuraPly Product AM 5X5 (25 sq. cm) Modifier: Quantity: 25 : CPT4 Code: [id number] Description: [id number] - SKIN SUB GRAFT TRNK/ARM/LEG ICD-10 Diagnosis  Description L97.828 Non-pressure chronic ulcer of other part of left lower leg with other specified s I87.312 Chronic venous hypertension (idiopathic) with ulcer of left lower extremity Modifier: everity Quantity: 1  Physician Procedures : CPT4 Code Description Modifier [id number] - WC PHYS SKIN SUB GRAFT TRNK/ARM/LEG ICD-10 Diagnosis Description L97.828 Non-pressure chronic ulcer of other part of left lower leg with other specified severity I87.312 Chronic venous hypertension  (idiopathic) with ulcer of left lower extremity Quantity: 1 Electronic Signature(s) Signed: 09/09/2022 12:04:24 PM By: Kalman Shan DO Entered By: Kalman Shan on 09/09/2022 11:36:58

## 2022-09-14 ENCOUNTER — Encounter: Payer: Self-pay | Admitting: Internal Medicine

## 2022-09-14 ENCOUNTER — Other Ambulatory Visit (INDEPENDENT_AMBULATORY_CARE_PROVIDER_SITE_OTHER): Payer: Medicare Other

## 2022-09-14 ENCOUNTER — Telehealth: Payer: Medicare Other | Admitting: Internal Medicine

## 2022-09-14 ENCOUNTER — Telehealth: Payer: Self-pay | Admitting: Internal Medicine

## 2022-09-14 ENCOUNTER — Ambulatory Visit (INDEPENDENT_AMBULATORY_CARE_PROVIDER_SITE_OTHER): Payer: Medicare Other | Admitting: Internal Medicine

## 2022-09-14 DIAGNOSIS — I272 Pulmonary hypertension, unspecified: Secondary | ICD-10-CM

## 2022-09-14 DIAGNOSIS — I2510 Atherosclerotic heart disease of native coronary artery without angina pectoris: Secondary | ICD-10-CM | POA: Diagnosis not present

## 2022-09-14 MED ORDER — POTASSIUM CHLORIDE CRYS ER 20 MEQ PO TBCR: 20.0000 meq | EXTENDED_RELEASE_TABLET | Freq: Two times a day (BID) | ORAL | 0 refills | Status: DC

## 2022-09-14 NOTE — Telephone Encounter (Signed)
  Patient Consent for Virtual Visit        [name] has provided verbal consent on 09/14/2022 for a virtual visit (video or telephone).   CONSENT FOR VIRTUAL VISIT FOR:  [name]  By participating in this virtual visit I agree to the following:  I hereby voluntarily request, consent and authorize [place] and its employed or contracted physicians, physician assistants, nurse practitioners or other licensed health care professionals (the Practitioner), to provide me with telemedicine health care services (the "Services") as deemed necessary by the treating Practitioner. I acknowledge and consent to receive the Services by the Practitioner via telemedicine. I understand that the telemedicine visit will involve communicating with the Practitioner through live audiovisual communication technology and the disclosure of certain medical information by electronic transmission. I acknowledge that I have been given the opportunity to request an in-person assessment or other available alternative prior to the telemedicine visit and am voluntarily participating in the telemedicine visit.  I understand that I have the right to withhold or withdraw my consent to the use of telemedicine in the course of my care at any time, without affecting my right to future care or treatment, and that the Practitioner or I may terminate the telemedicine visit at any time. I understand that I have the right to inspect all information obtained and/or recorded in the course of the telemedicine visit and may receive copies of available information for a reasonable fee.  I understand that some of the potential risks of receiving the Services via telemedicine include:  Delay or interruption in medical evaluation due to technological equipment failure or disruption; Information transmitted may not be sufficient (e.g. poor resolution of images) to allow for appropriate medical decision making by the Practitioner;  and/or  In rare instances, security protocols could fail, causing a breach of personal health information.  Furthermore, I acknowledge that it is my responsibility to provide information about my medical history, conditions and care that is complete and accurate to the best of my ability. I acknowledge that Practitioner's advice, recommendations, and/or decision may be based on factors not within their control, such as incomplete or inaccurate data provided by me or distortions of diagnostic images or specimens that may result from electronic transmissions. I understand that the practice of medicine is not an exact science and that Practitioner makes no warranties or guarantees regarding treatment outcomes. I acknowledge that a copy of this consent can be made available to me via my patient portal ([facility]), or I can request a printed copy by calling the office of [facility].    I understand that my insurance will be billed for this visit.   I have read or had this consent read to me. I understand the contents of this consent, which adequately explains the benefits and risks of the Services being provided via telemedicine.  I have been provided ample opportunity to ask questions regarding this consent and the Services and have had my questions answered to my satisfaction. I give my informed consent for the services to be provided through the use of telemedicine in my medical care

## 2022-09-15 ENCOUNTER — Ambulatory Visit: Payer: Medicare Other | Attending: Internal Medicine | Admitting: Internal Medicine

## 2022-09-15 ENCOUNTER — Encounter (HOSPITAL_BASED_OUTPATIENT_CLINIC_OR_DEPARTMENT_OTHER): Payer: Medicare Other | Admitting: Internal Medicine

## 2022-09-15 DIAGNOSIS — I272 Pulmonary hypertension, unspecified: Secondary | ICD-10-CM | POA: Insufficient documentation

## 2022-09-15 DIAGNOSIS — L97828 Non-pressure chronic ulcer of other part of left lower leg with other specified severity: Secondary | ICD-10-CM | POA: Diagnosis not present

## 2022-09-15 DIAGNOSIS — I87312 Chronic venous hypertension (idiopathic) with ulcer of left lower extremity: Secondary | ICD-10-CM

## 2022-09-15 DIAGNOSIS — J849 Interstitial pulmonary disease, unspecified: Secondary | ICD-10-CM | POA: Insufficient documentation

## 2022-09-15 DIAGNOSIS — I2510 Atherosclerotic heart disease of native coronary artery without angina pectoris: Secondary | ICD-10-CM | POA: Diagnosis present

## 2022-09-15 DIAGNOSIS — S81802A Unspecified open wound, left lower leg, initial encounter: Secondary | ICD-10-CM | POA: Diagnosis present

## 2022-09-15 MED ORDER — VERAPAMIL HCL ER 300 MG PO CP24: 1.0000 | ORAL_CAPSULE | Freq: Every day | ORAL | 3 refills | Status: DC

## 2022-09-15 MED ORDER — ROSUVASTATIN CALCIUM 20 MG PO TABS: 20.0000 mg | ORAL_TABLET | Freq: Every day | ORAL | 1 refills | Status: DC

## 2022-09-15 MED ORDER — POTASSIUM CHLORIDE CRYS ER 20 MEQ PO TBCR: 20.0000 meq | EXTENDED_RELEASE_TABLET | Freq: Two times a day (BID) | ORAL | 3 refills | Status: DC

## 2022-09-15 MED ORDER — FUROSEMIDE 80 MG PO TABS: 80.0000 mg | ORAL_TABLET | Freq: Every day | ORAL | 3 refills | Status: DC

## 2022-09-15 NOTE — Patient Instructions (Addendum)
Medication Instructions:  Your Physician recommend you continue on your current medication as directed.    *If you need a refill on your cardiac medications before your next appointment, please call your pharmacy*   Lab Work: None ordered today   Testing/Procedures: Your physician has requested that you have a lower extremity arterial duplex. During this test, ultrasound is used to evaluate arterial blood flow in the legs. Allow one hour for this exam. There are no restrictions or special instructions. This will take place at [facility] ([place]) [address], [place]    Follow-Up: At [facility], you and your health needs are our priority.  As part of our continuing mission to provide you with exceptional heart care, we have created designated Provider Care Teams.  These Care Teams include your primary Cardiologist (physician) and Advanced Practice Providers (APPs -  Physician Assistants and Nurse Practitioners) who all work together to provide you with the care you need, when you need it.  We recommend signing up for the patient portal called "MyChart".  Sign up information is provided on this After Visit Summary.  MyChart is used to connect with patients for Virtual Visits (Telemedicine).  Patients are able to view lab/test results, encounter notes, upcoming appointments, etc.  Non-urgent messages can be sent to your provider as well.   To learn more about what you can do with MyChart, go to [email or web address].    Your next appointment:   6 month(s)  Provider:   You may see Nelva Bush, MD or one of the following Advanced Practice Providers on your designated Care Team:   Murray Hodgkins, NP Christell Faith, PA-C Cadence Kathlen Mody, PA-C Gerrie Nordmann, NP    Your physician recommends that you schedule a follow-up appointment first available with Dr. Haroldine Laws

## 2022-09-15 NOTE — Progress Notes (Addendum)
Virtual Visit via Video Note   Because of [name]'s co-morbid illnesses, she is at least at moderate risk for complications without adequate follow up.  This format is felt to be most appropriate for this patient at this time.  All issues noted in this document were discussed and addressed.  A limited physical exam was performed with this format.  Please refer to the patient's chart for her consent to telehealth for [facility].       Date:  09/16/2022   ID:  BLESS HANSELMAN, DOB 1945-11-17, MRN [id number] The patient was identified using 2 identifiers.  Patient Location: Home Provider Location: Office/Clinic   PCP:  Lorene Dy, [facility] Providers Cardiologist:  Nelva Bush, MD     Evaluation Performed:  Follow-Up Visit  Chief Complaint:  Shortness of breath and leg wound  History of Present Illness:    SHAWNIE LOBELL is a 77 y.o. female with history of nonobstructive coronary artery disease, asthmatic bronchitis, interstitial lung disease, GERD, rheumatoid arthritis, CVA, prediabetes, IBS, and allergic rhinitis.  We are speaking today for follow-up of her pulmonary hypertension and nonobstructive coronary artery disease.  I last saw her in August, at which time she reported modest improvement in her breathing after starting Tyvaso.  She remained on 7 L of supplemental oxygen.  She reached out to [place] in October, complaining of left leg pain and swelling, prompting referral to the emergency department.  She was diagnosed with left lower extremity cellulitis and acute on chronic HFpEF/pulmonary hypertension.  She was treated with antibiotics and IV furosemide.  She has also been following in the wound care clinic due to very slow to heal ulcers on the left calf.  One of them has now healed over, that 1 persists.  She has had venous duplex and ABIs which did not show evidence of DVT.  ABIs showed noncompressible vessels with supranormal ABI  measurements.  Due to increased swelling, Dr. Mancel Bale increased Ms. Rask's most furosemide to 80 mg daily about a month ago.  Ms. Kurzawa continues to have shortness of breath with minimal activity.  She remains on 7 L of supplemental oxygen.  She is compliant with her medications though she is concerned about the cost of Tyvaso, which costs about thousand dollars a month.  She has not had any chest pain, palpitations, or lightheadedness.  Past Medical History:  Diagnosis Date   Acute asthmatic bronchitis    Allergic rhinitis    Anxiety    Arthritis    Esophageal reflux    Hypertension    Interstitial lung disease ([place]) dx jan 2020   Irritable bowel syndrome    Oxygen dependent    4 liters day time 6 liters at night   PONV (postoperative nausea and vomiting)    ponv likes zofran, and scopolamine patch   Pulmonary hypertension ([place])    Rheumatoid arthritis(714.0)    Sleep apnea    Past Surgical History:  Procedure Laterality Date    c secttion  1977   2 foot fusions Left    total of 6 left foot sx   ABDOMINAL HYSTERECTOMY     complete    ANKLE FUSION  2009   left   BACK SURGERY     lower l to l 5 fused   CHOLECYSTECTOMY     RIGHT HEART CATH N/A 11/09/2020   Procedure: RIGHT HEART CATH;  Surgeon: Jolaine Artist, MD;  Location: [facility]  [facility];  Service: Cardiovascular;  Laterality: N/A;   RIGHT/LEFT HEART CATH AND CORONARY ANGIOGRAPHY N/A 09/16/2016   Procedure: Right/Left Heart Cath and Coronary Angiography;  Surgeon: Nelva Bush, MD;  Location: [facility];  Service: Cardiovascular;  Laterality: N/A;   RIGHT/LEFT HEART CATH AND CORONARY ANGIOGRAPHY N/A 12/17/2021   Procedure: RIGHT/LEFT HEART CATH AND CORONARY ANGIOGRAPHY;  Surgeon: Jolaine Artist, MD;  Location: [place] [facility];  Service: Cardiovascular;  Laterality: N/A;   TONSILLECTOMY     TOTAL HIP ARTHROPLASTY Right 12/20/2018   Procedure: TOTAL HIP ARTHROPLASTY ANTERIOR APPROACH;  Surgeon: Paralee Cancel, MD;  Location: [facility];  Service: Orthopedics;  Laterality: Right;  70 mins   TOTAL KNEE ARTHROPLASTY Bilateral      Current Meds  Medication Sig   albuterol (VENTOLIN HFA) 108 (90 Base) MCG/ACT inhaler USE 2 INHALATIONS BY MOUTH  EVERY 6 HOURS AS NEEDED   aspirin EC 81 MG tablet Take 81 mg by mouth daily.   benzonatate (TESSALON) 200 MG capsule TAKE 1 CAPSULE (200 MG TOTAL) BY MOUTH 3 (THREE) TIMES DAILY AS NEEDED FOR COUGH.   cetirizine (ZYRTEC) 10 MG tablet Take 20 mg by mouth daily.   Cholecalciferol (VITAMIN D3) 250 MCG (10000 UT) capsule Take 10,000 Units by mouth daily.   clidinium-chlordiazePOXIDE (LIBRAX) 5-2.5 MG capsule Take 1 capsule by mouth 3 (three) times daily as needed (for IBS).   colestipol (COLESTID) 1 g tablet Take 1 g by mouth daily.   dicyclomine (BENTYL) 20 MG tablet Take 20 mg by mouth 4 (four) times daily as needed (IBS).   FLUoxetine (PROZAC) 20 MG capsule Take 20 mg by mouth daily.   fluticasone (FLONASE) 50 MCG/ACT nasal spray SPRAY 2 SPRAYS INTO EACH NOSTRIL EVERY DAY   ipratropium (ATROVENT) 0.03 % nasal spray USE 1 TO 2 SPRAYS IN BOTH  NOSTRILS TWICE DAILY   ipratropium-albuterol (DUONEB) 0.5-2.5 (3) MG/3ML SOLN USE 1 VIAL IN NEBULIZER EVERY 6 HOURS AS NEEDED J45.901   leflunomide (ARAVA) 20 MG tablet Take 1 tablet (20 mg total) by mouth daily.   loperamide (IMODIUM) 2 MG capsule Take 1 capsule (2 mg total) by mouth 2 (two) times daily as needed for diarrhea or loose stools.   Menthol, Topical Analgesic, (BIOFREEZE COOL THE PAIN) 4 % GEL Apply 1 Application topically 2 (two) times daily as needed (pain).   methocarbamol (ROBAXIN) 750 MG tablet Take 750 mg by mouth 2 (two) times daily as needed for muscle spasms.   montelukast (SINGULAIR) 10 MG tablet TAKE 1 TABLET BY MOUTH AT  BEDTIME   mupirocin ointment (BACTROBAN) 2 % Apply topically daily.   omeprazole (PRILOSEC) 40 MG capsule Take 40 mg by mouth at bedtime.    Oxycodone HCl 10 MG TABS Take 10 mg  by mouth 3 (three) times daily.   OXYGEN Inhale 7 L into the lungs continuous.   Pirfenidone (ESBRIET) 801 MG TABS Take 1 tablet (801 mg total) by mouth 3 (three) times daily.   Polyethyl Glycol-Propyl Glycol (SYSTANE) 0.4-0.3 % GEL ophthalmic gel Place 2 application  into both eyes 2 (two) times daily as needed (dry eyes).   predniSONE (DELTASONE) 5 MG tablet Take 5 mg by mouth daily with breakfast.   spironolactone (ALDACTONE) 25 MG tablet Take 0.5 tablets (12.5 mg total) by mouth daily.   Tocilizumab (ACTEMRA IV) Inject into the vein every 30 (thirty) days. infusion   traMADol (ULTRAM) 50 MG tablet Take 50 mg by mouth 3 (three) times daily  as needed for moderate pain.   traZODone (DESYREL) 100 MG tablet Take 100 mg by mouth at bedtime.   TRELEGY ELLIPTA 100-62.5-25 MCG/ACT AEPB USE 1 INHALATION BY MOUTH DAILY   TYVASO DPI MAINTENANCE KIT 64 MCG POWD Inhale 1 puff into the lungs in the morning, at noon, in the evening, and at bedtime.   [DISCONTINUED] furosemide (LASIX) 40 MG tablet Take 80 mg by mouth daily.   [DISCONTINUED] potassium chloride SA (KLOR-CON M20) 20 MEQ tablet Take 1 tablet (20 mEq total) by mouth 2 (two) times daily.   [DISCONTINUED] rosuvastatin (CRESTOR) 20 MG tablet TAKE 1 TABLET BY MOUTH DAILY   [DISCONTINUED] verapamil (CALAN) 120 MG tablet Take 120 mg by mouth daily.   [DISCONTINUED] Verapamil HCl CR 300 MG CP24 TAKE 1 CAPSULE BY MOUTH DAILY     Allergies:   Cefdinir; Erythromycin base; Lactose; Lactulose; Mesalamine; Methotrexate; Nitrofuran derivatives; Other; Sulfa antibiotics; Tdap [tetanus-diphth-acell pertussis]; and Tetanus toxoid, adsorbed   Social History   Tobacco Use   Smoking status: Never   Smokeless tobacco: Never   Tobacco comments:    positive passive tobacco smoke exposure  Vaping Use   Vaping Use: Never used  Substance Use Topics   Alcohol use: Not Currently    Alcohol/week: 7.0 standard drinks of alcohol    Types: 7 Standard drinks or  equivalent per week    Comment: 1 alcohol drink each night   Drug use: No     Family Hx: The patient's family history includes Arthritis in her mother; Diabetes in an other family member; Heart attack in her father and another family member; Heart disease in her mother.  ROS:   Please see the history of present illness.   All other systems reviewed and are negative.   Labs/Other Tests and Data Reviewed:    EKG:  An ECG dated 09/14/2022 was personally reviewed today and demonstrated:  Normal sinus rhythm with PAC's and PVC's and poor R wave progression.  No significant change since 04/20/2022.  Recent Labs: 04/20/2022: B Natriuretic Peptide 36.0 08/11/2022: ALT 6; BUN 13; Creatinine, Ser 0.67; Hemoglobin 10.9; Platelets 261.0; Potassium 3.5; Pro B Natriuretic peptide (BNP) 54.0; Sodium 140   Recent Lipid Panel Lab Results  Component Value Date/Time   CHOL 135 09/02/2020 11:17 AM   TRIG 122 09/02/2020 11:17 AM   HDL 74 09/02/2020 11:17 AM   CHOLHDL 1.8 09/02/2020 11:17 AM   LDLCALC 40 09/02/2020 11:17 AM   LDLDIRECT 53 01/09/2017 10:24 AM    Wt Readings from Last 3 Encounters:  09/14/22 174 lb (78.9 kg)  08/11/22 182 lb (82.6 kg)  06/07/22 187 lb 3.2 oz (84.9 kg)     Objective:    Vital Signs: See office visit from 2/28 2024 that had to be aborted  VITAL SIGNS:  reviewed GEN:  no acute distress  ASSESSMENT & PLAN:    Pulmonary hypertension and interstitial lung disease: Ms. Asante continues to have significant shortness of breath with modest activity driven by her pulmonary hypertension and interstitial lung disease.  Given continued leg swelling, I agree with continuation of increased dose of furosemide (80 mg daily).  We will arrange for a BMP when she presents for arterial Dopplers of the lower extremities at our [facility] office.  Defer further medication changes for now.  I will reach out to Drs. Bensimhon and Ramaswamy to inquire if there are any alternative therapies  that may be less costly for Ms. Haitz or if she may qualify for any  further financial assistance.  We will also arrange for her to see Dr. Haroldine Laws at his next available visit, as Ms. Ruffing missed her planned follow-up with him in December.  Nonobstructive coronary artery disease: Continue medical therapy to prevent progression of disease.  Left lower extremity wounds: Present since the fall, slow to heal.  Given that ABIs demonstrated noncompressible arteries, we will obtain arterial Doppler ultrasound to ensure that there is not some underlying stenosis preventing wound healing.  Continue close follow-up in the wound care clinic.  Time:   Today, I have spent 15 minutes with the patient with telehealth technology discussing the above problems.     Medication Adjustments/Labs and Tests Ordered: Current medicines are reviewed at length with the patient today.  Concerns regarding medicines are outlined above.   Tests Ordered: Orders Placed This Encounter  Procedures   VAS [place] LOWER EXTREMITY ARTERIAL DUPLEX    Medication Changes: Meds ordered this encounter  Medications   rosuvastatin (CRESTOR) 20 MG tablet    Sig: Take 1 tablet (20 mg total) by mouth daily.    Dispense:  90 tablet    Refill:  1    Please send a replace/new response with 90-Day Supply if appropriate to maximize member benefit. Requesting 1 year supply.   furosemide (LASIX) 80 MG tablet    Sig: Take 1 tablet (80 mg total) by mouth daily.    Dispense:  90 tablet    Refill:  3   Verapamil HCl CR 300 MG CP24    Sig: Take 1 capsule (300 mg total) by mouth daily.    Dispense:  90 capsule    Refill:  3    Please send a replace/new response with 90-Day Supply if appropriate to maximize member benefit. Requesting 1 year supply.   potassium chloride SA (KLOR-CON M20) 20 MEQ tablet    Sig: Take 1 tablet (20 mEq total) by mouth 2 (two) times daily.    Dispense:  180 tablet    Refill:  3    Follow Up:  In Person in 6  month(s)  Signed, Nelva Bush, MD  09/16/2022 8:25 PM    [place]

## 2022-09-16 ENCOUNTER — Encounter: Payer: Self-pay | Admitting: Internal Medicine

## 2022-09-16 NOTE — Progress Notes (Signed)
Erroneous encounter

## 2022-09-17 ENCOUNTER — Other Ambulatory Visit: Payer: Self-pay | Admitting: Internal Medicine

## 2022-09-17 DIAGNOSIS — I272 Pulmonary hypertension, unspecified: Secondary | ICD-10-CM

## 2022-09-18 ENCOUNTER — Other Ambulatory Visit: Payer: Self-pay | Admitting: Internal Medicine

## 2022-09-18 NOTE — Progress Notes (Signed)
[name] ([id number]) [id number] Page 1 of 8 Visit Report for 09/15/2022 Arrival Information Details Patient Name: Date of Service: [name] [name] 09/15/2022 12:45 PM Medical Record Number: [id number] Patient Account Number: [id number] Date of Birth/Sex: Treating RN: Mar 24, 1946 (77 y.o. [name], [name] Primary Care Sebrina Kessner: Lorene Dy Other Clinician: Referring Zakry Caso: Treating Omarie Parcell/Extender: [name] in Treatment: 14 Visit Information History Since Last Visit Added or deleted any medications: No Patient Arrived: Wheel Chair Any new allergies or adverse reactions: No Arrival Time: 12:41 Had a fall or experienced change in No Accompanied By: husband activities of daily living that may affect Transfer Assistance: Manual risk of falls: Patient Identification Verified: Yes Signs or symptoms of abuse/neglect since last visito No Secondary Verification Process Completed: Yes Hospitalized since last visit: No Patient Requires Transmission-Based Precautions: No Implantable device outside of the clinic excluding No Patient Has Alerts: Yes cellular tissue based products placed in the center Patient Alerts: ABI's: L: N/C 11/23 since last visit: Has Dressing in Place as Prescribed: Yes Has Compression in Place as Prescribed: Yes Pain Present Now: No Electronic Signature(s) Signed: 09/15/2022 5:16:11 PM By: Deon Pilling RN, BSN Entered By: Deon Pilling on 09/15/2022 12:41:41 -------------------------------------------------------------------------------- Encounter Discharge Information Details Patient Name: Date of Service: Hailey Courser, DO [name] 09/15/2022 12:45 PM Medical Record Number: [id number] Patient Account Number: [id number] Date of Birth/Sex: Treating RN: 08-24-1945 (77 y.o. [name] Primary Care Nakea Gouger: Lorene Dy Other Clinician: Referring Jonaven Hilgers: Treating Myeisha Kruser/Extender:  [name] in Treatment: 14 Encounter Discharge Information Items Post Procedure Vitals Discharge Condition: Stable Temperature (F): 98.1 Ambulatory Status: Wheelchair Pulse (bpm): 105 Discharge Destination: Home Respiratory Rate (breaths/min): 20 Transportation: Private Auto Blood Pressure (mmHg): 157/79 Accompanied By: husband Schedule Follow-up Appointment: Yes Clinical Summary of Care: Electronic Signature(s) Signed: 09/15/2022 5:16:11 PM By: Deon Pilling RN, BSN Entered By: Deon Pilling on 09/15/2022 13:18:35 -------------------------------------------------------------------------------- Lower Extremity Assessment Details Patient Name: Date of Service: Hailey [name], DO [name] 09/15/2022 12:45 PM Medical Record Number: [id number] Patient Account Number: [id number] Date of Birth/Sex: Treating RN: May 22, 1946 (77 y.o. [name], [name] Primary Care Zarriah Starkel: Lorene Dy Other Clinician: Referring Trevione Wert: Treating Videl Nobrega/Extender: [name] in Treatment: 14 Edema Assessment [name] Page 2 of 8] Assessed: [Left: Yes] [Right: No] Edema: [Left: N] [Right: o] Calf Left: Right: Point of Measurement: 29 cm From Medial Instep 30 cm Ankle Left: Right: Point of Measurement: 9 cm From Medial Instep 21 cm Vascular Assessment Pulses: Dorsalis Pedis Palpable: [Left:Yes] Electronic Signature(s) Signed: 09/15/2022 5:16:11 PM By: Deon Pilling RN, BSN Entered By: Deon Pilling on 09/15/2022 12:42:07 -------------------------------------------------------------------------------- Multi Wound Chart Details Patient Name: Date of Service: Hailey [name], DO [name] 09/15/2022 12:45 PM Medical Record Number: [id number] Patient Account Number: [id number] Date of Birth/Sex: Treating RN: [birth year] (77 y.o. F) Primary Care Emerson Schreifels: Lorene Dy Other  Clinician: Referring Iris Tatsch: Treating Timaya Bojarski/Extender: [name] in Treatment: 14 Vital Signs Height(in): 61 Pulse(bpm): 105 Weight(lbs): 186 Blood Pressure(mmHg): 157/79 Body Mass Index(BMI): 35.1 Temperature(F): 98.1 Respiratory Rate(breaths/min): 20 [4:Photos: No Photos] [N/A:N/A] Left, Proximal, Anterior Lower Leg Left, Distal, Anterior Lower Leg N/A Wound Location: Gradually Appeared Gradually Appeared N/A Wounding Event: Cellulitis Cellulitis N/A Primary Etiology: Asthma, Sleep Apnea, Hypertension, Asthma, Sleep Apnea, Hypertension, N/A Comorbid History: Colitis, Rheumatoid Arthritis, Colitis, Rheumatoid Arthritis, Osteoarthritis Osteoarthritis 04/19/2022 04/19/2022 N/A Date Acquired: 14 14 N/A Weeks of  Treatment: Healed - Epithelialized Open N/A Wound Status: No No N/A Wound Recurrence: 0x0x0 4.6x2.8x0.1 N/A Measurements L x W x D (cm) 0 10.116 N/A A (cm) : rea 0 1.012 N/A Volume (cm) : 100.00% 34.90% N/A % Reduction in Area: 100.00% 34.90% N/A % Reduction in Volume: Full Thickness With Exposed Support Full Thickness With Exposed Support N/A Classification: Structures Structures None Present Medium N/A Exudate Amount: N/A Serosanguineous N/A Exudate Type: N/A red, brown N/A Exudate Color: Distinct, outline attached Distinct, outline attached N/A Wound Margin: None Present (0%) Medium (34-66%) N/A Granulation Amount: N/A Pink, Pale N/A Granulation Quality: None Present (0%) Medium (34-66%) N/A Necrotic Amount: [name] ([id number]) [id number] Page 3 of 8 Fascia: No Fat Layer (Subcutaneous Tissue): Yes N/A Exposed Structures: Fat Layer (Subcutaneous Tissue): No Tendon: Yes Tendon: No Fascia: No Muscle: No Muscle: No Joint: No Joint: No Bone: No Bone: No Large (67-100%) Small (1-33%) N/A Epithelialization: N/A Debridement - Excisional N/A Debridement: Pre-procedure  Verification/Time Out N/A 13:05 N/A Taken: N/A Lidocaine 4% Topical Solution N/A Pain Control: N/A Subcutaneous, Slough N/A Tissue Debrided: N/A Skin/Subcutaneous Tissue N/A Level: N/A 12.88 N/A Debridement A (sq cm): rea N/A Curette N/A Instrument: N/A Minimum N/A Bleeding: N/A Pressure N/A Hemostasis A chieved: N/A 0 N/A Procedural Pain: N/A 0 N/A Post Procedural Pain: N/A Procedure was tolerated well N/A Debridement Treatment Response: N/A 4.6x28x0.1 N/A Post Debridement Measurements L x W x D (cm) N/A 10.116 N/A Post Debridement Volume: (cm) Excoriation: No Excoriation: No N/A Periwound Skin Texture: Induration: No Induration: No Callus: No Callus: No Crepitus: No Crepitus: No Rash: No Rash: No Scarring: No Scarring: No Maceration: No Maceration: No N/A Periwound Skin Moisture: Dry/Scaly: No Dry/Scaly: No Atrophie Blanche: No Atrophie Blanche: No N/A Periwound Skin Color: Cyanosis: No Cyanosis: No Ecchymosis: No Ecchymosis: No Erythema: No Erythema: No Hemosiderin Staining: No Hemosiderin Staining: No Mottled: No Mottled: No Pallor: No Pallor: No Rubor: No Rubor: No No Abnormality No Abnormality N/A Temperature: Yes Yes N/A Tenderness on Palpation: N/A Cellular or Tissue Based Product N/A Procedures Performed: Debridement Treatment Notes Wound #5 (Lower Leg) Wound Laterality: Left, Anterior, Distal Cleanser Wound Cleanser Discharge Instruction: Cleanse the wound with wound cleanser prior to applying a clean dressing using gauze sponges, not tissue or cotton balls. Peri-Wound Care Topical Primary Dressing Puraply #4 Discharge Instruction: applied by Jacklynn Dehaas. Adaptic and steri-strips Discharge Instruction: Used to secure Puraply AM. Leave in place. Do not remove. Secondary Dressing Zetuvit Plus Silicone Border Dressing 5x5 (in/in) Discharge Instruction: Apply silicone border over primary dressing as directed. Secured  With Compression Wrap Compression Stockings Add-Ons Electronic Signature(s) Signed: 09/15/2022 4:08:18 PM By: Kalman Shan DO Entered By: Kalman Shan on 09/15/2022 13:44:56 Buckles, Arvil Persons ([id number]) [id number] Page 4 of 8 -------------------------------------------------------------------------------- Multi-Disciplinary Care Plan Details Patient Name: Date of Service: Serina Cowper [employer] S. 09/15/2022 12:45 PM Medical Record Number: [id number] Patient Account Number: [id number] Date of Birth/Sex: Treating RN: [birth year] (77 y.o. [name], [name] Primary Care Travis Purk: Lorene Dy Other Clinician: Referring Lynsee Wands: Treating Terease Marcotte/Extender: [name] in Treatment: 14 Active Inactive Pain, Acute or Chronic Nursing Diagnoses: Pain, acute or chronic: actual or potential Potential alteration in comfort, pain Goals: Patient will verbalize adequate pain control and receive pain control interventions during procedures as needed Date Initiated: 06/03/2022 Target Resolution Date: 10/14/2022 Goal Status: Active Patient/caregiver will verbalize comfort level met Date Initiated: 06/03/2022 Target Resolution Date: 10/14/2022 Goal Status: Active Interventions: Encourage patient to take pain medications  as prescribed Provide education on pain management Treatment Activities: Administer pain control measures as ordered : 06/03/2022 Notes: Electronic Signature(s) Signed: 09/15/2022 5:16:11 PM By: Deon Pilling RN, BSN Entered By: Deon Pilling on 09/15/2022 12:51:10 -------------------------------------------------------------------------------- Pain Assessment Details Patient Name: Date of Service: Hailey Courser, DO [name] 09/15/2022 12:45 PM Medical Record Number: [id number] Patient Account Number: [id number] Date of Birth/Sex: Treating RN: [birth year] (77 y.o. [name], [name] Primary Care Breezy Hertenstein: Lorene Dy Other  Clinician: Referring Melannie Metzner: Treating Sheril Hammond/Extender: [name] in Treatment: 14 Active Problems Location of Pain Severity and Description of Pain Patient Has Paino No Site Locations Minorca, Gardiner ([id number]) [id number] Page 5 of 8 Pain Management and Medication Current Pain Management: Electronic Signature(s) Signed: 09/15/2022 5:16:11 PM By: Deon Pilling RN, BSN Entered By: Deon Pilling on 09/15/2022 12:41:53 -------------------------------------------------------------------------------- Patient/Caregiver Education Details Patient Name: Date of Service: Hailey Jetta Lout 2/29/2024andnbsp12:45 PM Medical Record Number: [id number] Patient Account Number: [id number] Date of Birth/Gender: Treating RN: 1945/07/23 (77 y.o. [name] Primary Care Physician: Lorene Dy Other Clinician: Referring Physician: Treating Physician/Extender: [name] in Treatment: 14 Education Assessment Education Provided To: Patient Education Topics Provided Wound/Skin Impairment: Handouts: Caring for Your Ulcer Methods: Explain/Verbal Responses: Reinforcements needed Electronic Signature(s) Signed: 09/15/2022 5:16:11 PM By: Deon Pilling RN, BSN Entered By: Deon Pilling on 09/15/2022 12:51:23 -------------------------------------------------------------------------------- Wound Assessment Details Patient Name: Date of Service: Hailey Courser, DO [name] 09/15/2022 12:45 PM Medical Record Number: [id number] Patient Account Number: [id number] Date of Birth/Sex: Treating RN: [birth year] (77 y.o. [name], [name] Primary Care Oktober Glazer: Lorene Dy Other Clinician: Referring Williard Keller: Treating Makaiah Terwilliger/Extender: [name] in Treatment: 14 Wound Status Wound Number: 4 Primary Cellulitis Etiology: Wound Location: Left, Proximal, Anterior Lower Leg Wound Healed -  Epithelialized Wounding Event: Gradually Appeared Status: Date Acquired: 04/19/2022 Comorbid Asthma, Sleep Apnea, Hypertension, Colitis, Rheumatoid Weeks Of Treatment: 14 History: Arthritis, Osteoarthritis Clustered Wound: No Wound Measurements Length: (cm) Width: (cm) Depth: (cm) Area: (cm) Volume: (cm) 0 % Reduction in Area: 100% 0 % Reduction in Volume: 100% 0 Epithelialization: Large (67-100%) 0 Tunneling: No 0 Undermining: No Wound Description Classification: Full Thickness With Exposed Support Structures Wound Margin: Distinct, outline attached Exudate Amount: None Present Foul Odor After Cleansing: No Slough/Fibrino No Wound Bed Granulation Amount: None Present (0%) Exposed VIRJINIA, MANTEY ([id number]) [id number] Page 6 of 8 Necrotic Amount: None Present (0%) Fascia Exposed: No Fat Layer (Subcutaneous Tissue) Exposed: No Tendon Exposed: No Muscle Exposed: No Joint Exposed: No Bone Exposed: No Periwound Skin Texture Texture Color No Abnormalities Noted: No No Abnormalities Noted: No Callus: No Atrophie Blanche: No Crepitus: No Cyanosis: No Excoriation: No Ecchymosis: No Induration: No Erythema: No Rash: No Hemosiderin Staining: No Scarring: No Mottled: No Pallor: No Moisture Rubor: No No Abnormalities Noted: No Dry / Scaly: No Temperature / Pain Maceration: No Temperature: No Abnormality Tenderness on Palpation: Yes Electronic Signature(s) Signed: 09/15/2022 5:16:11 PM By: Deon Pilling RN, BSN Entered By: Deon Pilling on 09/15/2022 12:46:25 -------------------------------------------------------------------------------- Wound Assessment Details Patient Name: Date of Service: Hailey [name], DO [name] 09/15/2022 12:45 PM Medical Record Number: [id number] Patient Account Number: [id number] Date of Birth/Sex: Treating RN: 1945-12-05 (77 y.o. [name] Primary Care Skyelar Halliday: Lorene Dy Other  Clinician: Referring Terecia Plaut: Treating Suttyn Cryder/Extender: [name] in Treatment: 14 Wound Status Wound Number: 5 Primary Cellulitis Etiology: Wound Location: Left, Distal, Anterior Lower Leg Wound  Open Wounding Event: Gradually Appeared Status: Date Acquired: 04/19/2022 Comorbid Asthma, Sleep Apnea, Hypertension, Colitis, Rheumatoid Weeks Of Treatment: 14 History: Arthritis, Osteoarthritis Clustered Wound: No Photos Wound Measurements Length: (cm) 4.6 Width: (cm) 2.8 Depth: (cm) 0.1 Area: (cm) 10.116 Volume: (cm) 1.012 % Reduction in Area: 34.9% % Reduction in Volume: 34.9% Epithelialization: Small (1-33%) Tunneling: No Undermining: No Wound Description Classification: Full Thickness With Exposed Support S Wound Margin: Distinct, outline attached Exudate Amount: Medium Exudate Type: Serosanguineous Exudate Color: red, brown TAMEICA, BARRERAS ([id number]) tructures Foul Odor After Cleansing: No Slough/Fibrino Yes [phone] Page 7 of 8 Wound Bed Granulation Amount: Medium (34-66%) Exposed Structure Granulation Quality: Pink, Pale Fascia Exposed: No Necrotic Amount: Medium (34-66%) Fat Layer (Subcutaneous Tissue) Exposed: Yes Necrotic Quality: Adherent Slough Tendon Exposed: Yes Muscle Exposed: No Joint Exposed: No Bone Exposed: No Periwound Skin Texture Texture Color No Abnormalities Noted: No No Abnormalities Noted: No Callus: No Atrophie Blanche: No Crepitus: No Cyanosis: No Excoriation: No Ecchymosis: No Induration: No Erythema: No Rash: No Hemosiderin Staining: No Scarring: No Mottled: No Pallor: No Moisture Rubor: No No Abnormalities Noted: No Dry / Scaly: No Temperature / Pain Maceration: No Temperature: No Abnormality Tenderness on Palpation: Yes Treatment Notes Wound #5 (Lower Leg) Wound Laterality: Left, Anterior, Distal Cleanser Wound Cleanser Discharge Instruction: Cleanse  the wound with wound cleanser prior to applying a clean dressing using gauze sponges, not tissue or cotton balls. Peri-Wound Care Topical Primary Dressing Puraply #4 Discharge Instruction: applied by Maribelle Hopple. Adaptic and steri-strips Discharge Instruction: Used to secure Puraply AM. Leave in place. Do not remove. Secondary Dressing Zetuvit Plus Silicone Border Dressing 5x5 (in/in) Discharge Instruction: Apply silicone border over primary dressing as directed. Secured With Compression Wrap Compression Stockings Environmental education officer) Signed: 09/15/2022 5:16:11 PM By: Deon Pilling RN, BSN Entered By: Deon Pilling on 09/15/2022 12:47:06 -------------------------------------------------------------------------------- Vitals Details Patient Name: Date of Service: Hailey [name], DO [name] 09/15/2022 12:45 PM Medical Record Number: [id number] Patient Account Number: [id number] Date of Birth/Sex: Treating RN: [birth year] (77 y.o. [name] Primary Care Jonmichael Beadnell: Lorene Dy Other Clinician: Referring Shabria Egley: Treating Emilie Carp/Extender: [name] in Treatment: 14 Vital Signs Time Taken: 12:40 Temperature (F): 98.1 Height (in): 61 Pulse (bpm): 105 Weight (lbs): 186 Respiratory Rate (breaths/min): 20 HOLLEY, PRUETT ([id number]) [id number] Page 8 of 8 Body Mass Index (BMI): 35.1 Blood Pressure (mmHg): 157/79 Reference Range: 80 - 120 mg / dl Electronic Signature(s) Signed: 09/15/2022 5:16:11 PM By: Deon Pilling RN, BSN Entered By: Deon Pilling on 09/15/2022 12:42:48

## 2022-09-18 NOTE — Progress Notes (Signed)
Hailey, [name] ([id number]) [id number] Page 1 of 11 Visit Report for 09/15/2022 Chief Complaint Document Details Patient Name: Date of Service: [name] RO [facility] S. 09/15/2022 12:45 PM Medical Record Number: [id number] Patient Account Number: [id number] Date of Birth/Sex: Treating RN: 10-27-1945 (77 y.o. F) Primary Care Provider: Lorene [name] Other Clinician: Referring Provider: Treating Provider/Extender: [name] in Treatment: 14 Information Obtained from: Patient Chief Complaint 06/03/2022; Left lower extremity wounds Electronic Signature(s) Signed: 09/15/2022 4:08:18 PM By: Kalman Shan DO Entered By: Kalman Shan on 09/15/2022 13:46:13 -------------------------------------------------------------------------------- Cellular or Tissue Based Product Details Patient Name: Date of Service: Hailey Courser, DO [name] 09/15/2022 12:45 PM Medical Record Number: [id number] Patient Account Number: [id number] Date of Birth/Sex: Treating RN: 1946/02/21 (77 y.o. [name], [name] Primary Care Provider: Lorene [name] Other Clinician: Referring Provider: Treating Provider/Extender: [name] in Treatment: 14 Cellular or Tissue Based Product Type Wound #5 Left,Distal,Anterior Lower Leg Applied to: Performed By: Physician Kalman Shan, DO Cellular or Tissue Based Product Type: Puraply AM Level of Consciousness (Pre-procedure): Awake and Alert Pre-procedure Verification/Time Out Yes - 13:10 Taken: Location: trunk / arms / legs Wound Size (sq cm): 12.88 Product Size (sq cm): 12 Waste Size (sq cm): 0 Amount of Product Applied (sq cm): 12 Lot #: [id number] Order #: [id number] Expiration Date: 08/15/2024 Fenestrated: No Reconstituted: Yes Solution Type: normal saline Solution Amount: 10 mL Lot #: [id number] Expiration Date: 12/15/2024 Secured: Yes Secured With: Steri-Strips, adaptic Dressing  Applied: No Procedural Pain: 0 Post Procedural Pain: 0 Response to Treatment: Procedure was tolerated well Level of Consciousness (Post- Awake and Alert procedure): Post Procedure Diagnosis Same as Pre-procedure Electronic Signature(s) Signed: 09/15/2022 4:08:18 PM By: Kalman Shan DO Signed: 09/15/2022 5:16:11 PM By: Deon Pilling RN, BSN Entered By: Deon Pilling on 09/15/2022 13:17:40 [name] ([id number]) [id number] Page 2 of 11 -------------------------------------------------------------------------------- Debridement Details Patient Name: Date of Service: [name] [name] 09/15/2022 12:45 PM Medical Record Number: [id number] Patient Account Number: [id number] Date of Birth/Sex: Treating RN: Dec 14, 1945 (77 y.o. [name], Hailey.[name] Primary Care Provider: Lorene [name] Other Clinician: Referring Provider: Treating Provider/Extender: [name] in Treatment: 14 Debridement Performed for Assessment: Wound #5 Left,Distal,Anterior Lower Leg Performed By: Physician Kalman Shan, DO Debridement Type: Debridement Level of Consciousness (Pre-procedure): Awake and Alert Pre-procedure Verification/Time Out Yes - 13:05 Taken: Start Time: 13:06 Pain Control: Lidocaine 4% T opical Solution T Area Debrided (L x W): otal 4.6 (cm) x 2.8 (cm) = 12.88 (cm) Tissue and other material debrided: Viable, Non-Viable, Slough, Subcutaneous, Slough Level: Skin/Subcutaneous Tissue Debridement Description: Excisional Instrument: Curette Bleeding: Minimum Hemostasis Achieved: Pressure End Time: 13:12 Procedural Pain: 0 Post Procedural Pain: 0 Response to Treatment: Procedure was tolerated well Level of Consciousness (Post- Awake and Alert procedure): Post Debridement Measurements of Total Wound Length: (cm) 4.6 Width: (cm) 28 Depth: (cm) 0.1 Volume: (cm) 10.116 Character of Wound/Ulcer Post Debridement: Improved Post  Procedure Diagnosis Same as Pre-procedure Electronic Signature(s) Signed: 09/15/2022 4:08:18 PM By: Kalman Shan DO Signed: 09/15/2022 5:16:11 PM By: Deon Pilling RN, BSN Entered By: Deon Pilling on 09/15/2022 13:12:34 -------------------------------------------------------------------------------- HPI Details Patient Name: Date of Service: Hailey Courser, DO [name] 09/15/2022 12:45 PM Medical Record Number: [id number] Patient Account Number: [id number] Date of Birth/Sex: Treating RN: 01/03/1946 (77 y.o. F) Primary Care Provider: Lorene [name] Other Clinician: Referring Provider: Treating Provider/Extender: [name] in Treatment: 14 History of  Present Illness HPI Description: Patient presents today for initial evaluation and our clinic as a referral from the [facility] Department of orthopedics for evaluation and treatment of the wound to his his of the left foot at the base of the great toe. Currently the good news is the patient really does not have any significant pain which is excellent. She has been seen by Dr. Linus Salmons at [facility]: infectious disease clinic that is the regional Center for infectious disease. Subsequently the patient was cultured and positive for methicillin sensitive staph aureus. She has been on antibiotics including Cipro prior to surgery as will several days following surgery. She then had clindamycin which was changed to doxycycline. She took this for 10 days. Subsequently the pain had improved and there was no longer any possible draining from the wound according to the notes. The patient sedentary rate as well as C-reactive protein have returned to normal ranges. Currently per Dr. Henreitta Leber assessment there was one dehiscence with no sign of osteomyelitis. He therefore discontinue the antibiotics with the completion of the last three days Of doxycycline. Other than that he just set her for a follow-up as needed. The patient  does not have diabetes and is not a current smoker. At this time her treatment that was recommended by the surgeon was applying a Hydrocolloid dressing. Based on what I'm seeing at this point the patient actually has some Slough noted over the surface of the wound there really does not appear to be evidence of infection though I do think she does require some sharp debridement to clear away the slough and help with appropriate wound healing. She does have areas of granulation buds noted. She may be a candidate for a skin substitute as well. We will see how things do over the next period of time until follow-up. No fevers, chills, nausea, or vomiting noted at this time. [name], [name] ([id number]) [id number] Page 3 of 11 06/13/18 on evaluation today patient actually appears to be doing better in regard to her toe ulcer. She's been using the Prisma on this region and that seems to have done extremely well for her. Fortunately there does not appear to be evidence of infection at this time which is good news. She did see her surgeon they were extremely pleased with the overall appearance of her wound. 06/27/18 on evaluation today patient actually appears to be doing very well in regard to her foot ulcer. She has been tolerating the dressing changes without complication which is great news. She did see her podiatrist they felt like everything is looking very nice as well and will please. 07/05/2018 surgical wound on the left foot. She appears to be doing well. Still surface debrided to remove however in general post debridement the wound bed looks quite healthy it is come down significantly in terms of dimensions using silver collagen. The patient describes pain when her foot is elevated either in bed at night [sometimes keeps her awake] or when is propped up on a foot rest. She basically takes analgesics for this. She does not really describe claudication with activity however  her activity is very limited by interstitial lung disease. Her ABIs initially in this clinic were noncompressible. She is not a diabetic 07/20/2018; surgical wound on the left foot. Wound actually looks quite a bit better than I remember seeing this. She is still describing pain with her leg elevated at night that she does not get when the wound is supine. She does not  really describe claudication but she is very limited by her pulmonary status. We have been using silver collagen. 1/17; the patient has been followed up by orthopedics and discharge. Her arterial studies were actually quite good and should not be contributing to any pain. Slight reduction in ABIs but otherwise normal. Her wound is closed. She saw Dr. Linus Salmons of infectious disease surrounding the surgery. By review of our notes she was not felt to have osteomyelitis. 06/03/2022 Ms. [name] is a 77 year old female with a past medical history of interstitial lung disease on chronic oxygen via nasal cannula, rheumatoid arthritis, chronic diastolic heart failure and OSA that presents to the clinic for a 1 to 40-monthhistory of nonhealing ulcer to the left lower extremity. On 04/20/2022 she was admitted to the hospital for left lower extremity cellulitis. She required 8 days of IV vancomycin and Unasyn followed by a 14-day course of Augmentin and doxycycline. She has been using Silvadene cream to the area. She does not use compression therapy but does own compression stockings. She currently denies signs of infection. 11/27; patient presents for follow-up she is using Medihoney and Hydrofera Blue to the wound beds. She has been using her Tubigrip. She has no issues or complaints today. 12/4; patient presents for follow-up. She continues to use Medihoney and Hydrofera Blue to the wound beds. Several attempts have been made to schedule ABIs with TBI's. We gave patient the number today to call to have these scheduled. She has been using  Tubigrip. She has no issues or complaints today. 12/18; she continues to use Medihoney and Hydrofera Blue in general the surface of the wound is looking somewhat better this week and measurements are slightly smaller. She did have her arterial studies done these were noncompressible bilaterally however TBI on the right of 0.69 on the left 0.62. Waveforms on the right were triphasic on the left biphasic to triphasic. This does not suggest significant arterial disease to make healing wounds at this location difficult. 12/29; patient presents for follow-up. She has been using Medihoney and Hydrofera Blue to the wound bed. She has been using Tubigrip. 1/5; patient presents for follow-up. She is been using Medihoney and Hydrofera Blue to the wound bed along with Tubigrip. The Tubigrip was doubled at last clinic visit. There is been improvement in wound healing. 1/12; patient presents for follow-up. She has been using Medihoney and Hydrofera Blue to the wound bed along with Tubigrip. There continues to be improvement in wound healing. 1/19; patient presents for follow-up. She has been using Medihoney and Hydrofera Blue to the wound bed along with her juxta light compression daily. There is been improvement in wound measurements. Patient has no issues or complaints today. 1/25; patient presents for follow-up. She has been using blast X and collagen to the wound bed under juxta light compression daily. She has no issues or complaints today. There is been improvement in wound healing. 2/1; patient presents for follow-up. She has been using blast X and collagen to the wound bed under juxta light compression daily. She has no issues or complaints today. 2/9; patient presents for follow-up. She has been using Hydrofera Blue with blast X under compression wrap daily. She has been approved for a skin substitute and patient would like to proceed with this. 2/15; patient presents for follow-up. PuraPly was placed  in standard fashion to the large anterior leg wound. She has been using blast X and Hydrofera Blue to the more proximal small wound. She has been using  her juxta light compression daily. She has no issues or complaints today. 2/23; patient presents for follow-up. PuraPly was placed in standard fashion to the large anterior leg wound. The more proximal small wound is almost healed and they have been using blast X and Hydrofera Blue. She has been using her juxta light compression daily to the left lower extremity. There is been improvement in wound healing. 2/29; patient presents for follow-up. PuraPly was placed in standard fashion to the large anterior leg wound. The more proximal wound has healed. She is been using her juxta light compression daily. She has no issues or complaints today. Electronic Signature(s) Signed: 09/15/2022 4:08:18 PM By: Kalman Shan DO Entered By: Kalman Shan on 09/15/2022 13:46:54 -------------------------------------------------------------------------------- Physical Exam Details Patient Name: Date of Service: Hailey Courser, DO [name] 09/15/2022 12:45 PM Medical Record Number: [id number] Patient Account Number: [id number] Date of Birth/Sex: Treating RN: 1946-01-27 (77 y.o. F) Primary Care Provider: Lorene [name] Other Clinician: Referring Provider: Treating Provider/Extender: [name] in Treatment: 14 Constitutional respirations regular, non-labored and within target range for patient.. Cardiovascular [name], [name] ([id number]) [id number] Page 4 of 11 2+ dorsalis pedis/posterior tibialis pulses. Psychiatric pleasant and cooperative. Notes T the anterior aspect of left lower extremity there is an open wound with slough and granulation tissue. The small proximal wound has epithelialized. Good o edema control. No signs of infection. Obvious deformity to the foot from previous  surgeries. Electronic Signature(s) Signed: 09/15/2022 4:08:18 PM By: Kalman Shan DO Entered By: Kalman Shan on 09/15/2022 13:47:45 -------------------------------------------------------------------------------- Physician Orders Details Patient Name: Date of Service: Hailey Courser, DO [name] 09/15/2022 12:45 PM Medical Record Number: [id number] Patient Account Number: [id number] Date of Birth/Sex: Treating RN: 21-Jun-1946 (77 y.o. [name], Hailey.[name] Primary Care Provider: Lorene [name] Other Clinician: Referring Provider: Treating Provider/Extender: [name] in Treatment: 14 Verbal / Phone Orders: No Diagnosis Coding ICD-10 Coding Code Description [phone] Non-pressure chronic ulcer of other part of left lower leg with other specified severity I87.312 Chronic venous hypertension (idiopathic) with ulcer of left lower extremity J84.9 Interstitial pulmonary disease, unspecified M06.9 Rheumatoid arthritis, unspecified Follow-up Appointments ppointment in 1 week. - Dr. Heber Soper 09/23/2022 1015 Friday Return A ppointment in 2 weeks. - Dr. Heber Onward 09/30/2022 1100 Friday Return A Anesthetic (In clinic) Topical Lidocaine 5% applied to wound bed Cellular or Tissue Based Products Wound #5 Left,Distal,Anterior Lower Leg Cellular or Tissue Based Product Type: - 08/18/2022 insurance ran organogenesis- 100% covered. 08/26/2022 #1 Puraply AM applied. 09/01/2022 #2 Puraply AM applied. 09/09/2022 #3 Puraply AM applied to both wounds. 09/15/2022 #4 Puraply AM applied to wound. Cellular or Tissue Based Product applied to wound bed, secured with steri-strips, cover with Adaptic or Mepitel. (DO NOT REMOVE). Bathing/ Shower/ Hygiene May shower with protection but do not get wound dressing(s) wet. Protect dressing(s) with water repellant cover (for example, large plastic bag) or a cast cover and may then take shower. Edema Control - Lymphedema / SCD / Other Elevate legs to the  level of the heart or above for 30 minutes daily and/or when sitting for 3-4 times a day throughout the day. Avoid standing for long periods of time. Exercise regularly Compression stocking or Garment 20-30 mm/Hg pressure to: - start wearing the juxtalite HD apply in the morning and remove at night left leg. Non Wound Condition Protect area with: - buttock area with AandD ointment daily Wound Treatment Wound #5 - Lower Leg Wound Laterality: Left, Anterior, Distal  Cleanser: Wound Cleanser (Generic) 1 x Per Week/30 Days Discharge Instructions: Cleanse the wound with wound cleanser prior to applying a clean dressing using gauze sponges, not tissue or cotton balls. Prim Dressing: Puraply #4 1 x Per Week/30 Days ary Discharge Instructions: applied by provider. Prim Dressing: Adaptic and steri-strips 1 x Per Week/30 Days ary Discharge Instructions: Used to secure Puraply AM. Leave in place. Do not remove. [name], [name] ([id number]) [id number] Page 5 of 11 Secondary Dressing: Zetuvit Plus Silicone Border Dressing 5x5 (in/in) 1 x Per Week/30 Days Discharge Instructions: Apply silicone border over primary dressing as directed. Electronic Signature(s) Signed: 09/15/2022 4:08:18 PM By: Kalman Shan DO Entered By: Kalman Shan on 09/15/2022 13:47:54 -------------------------------------------------------------------------------- Problem List Details Patient Name: Date of Service: Hailey Courser, DO [name] 09/15/2022 12:45 PM Medical Record Number: [id number] Patient Account Number: [id number] Date of Birth/Sex: Treating RN: 13-Jan-1946 (77 y.o. [name], [name] Primary Care Provider: Lorene [name] Other Clinician: Referring Provider: Treating Provider/Extender: [name] in Treatment: 14 Active Problems ICD-10 Encounter Code Description Active Date MDM Diagnosis L97.828 Non-pressure chronic ulcer of other part of left lower leg  with other specified 06/03/2022 No Yes severity I87.312 Chronic venous hypertension (idiopathic) with ulcer of left lower extremity 06/03/2022 No Yes J84.9 Interstitial pulmonary disease, unspecified 06/03/2022 No Yes M06.9 Rheumatoid arthritis, unspecified 06/03/2022 No Yes Inactive Problems Resolved Problems Electronic Signature(s) Signed: 09/15/2022 4:08:18 PM By: Kalman Shan DO Previous Signature: 09/15/2022 12:51:55 PM Version By: Kalman Shan DO Entered By: Kalman Shan on 09/15/2022 13:44:36 -------------------------------------------------------------------------------- Progress Note Details Patient Name: Date of Service: [name], DO [name] 09/15/2022 12:45 PM Medical Record Number: [id number] Patient Account Number: [id number] Date of Birth/Sex: Treating RN: 05-Apr-1946 (77 y.o. F) Primary Care Provider: Lorene [name] Other Clinician: Referring Provider: Treating Provider/Extender: [name] in Treatment: 14 Subjective Chief Complaint Information obtained from Patient 06/03/2022; Left lower extremity wounds History of Present Illness (HPI) Patient presents today for initial evaluation and our clinic as a referral from the [facility] health system Department of orthopedics for evaluation and TIJANA, TRUDGEON ([id number]) [id number] Page 6 of 11 treatment of the wound to his his of the left foot at the base of the great toe. Currently the good news is the patient really does not have any significant pain which is excellent. She has been seen by Dr. Linus Salmons at [facility]: infectious disease clinic that is the regional Center for infectious disease. Subsequently the patient was cultured and positive for methicillin sensitive staph aureus. She has been on antibiotics including Cipro prior to surgery as will several days following surgery. She then had clindamycin which was changed to doxycycline. She took this for  10 days. Subsequently the pain had improved and there was no longer any possible draining from the wound according to the notes. The patient sedentary rate as well as C-reactive protein have returned to normal ranges. Currently per Dr. Henreitta Leber assessment there was one dehiscence with no sign of osteomyelitis. He therefore discontinue the antibiotics with the completion of the last three days Of doxycycline. Other than that he just set her for a follow-up as needed. The patient does not have diabetes and is not a current smoker. At this time her treatment that was recommended by the surgeon was applying a Hydrocolloid dressing. Based on what I'm seeing at this point the patient actually has some [facility] noted over the surface of the wound there really does not appear to  be evidence of infection though I do think she does require some sharp debridement to clear away the slough and help with appropriate wound healing. She does have areas of granulation buds noted. She may be a candidate for a skin substitute as well. We will see how things do over the next period of time until follow-up. No fevers, chills, nausea, or vomiting noted at this time. 06/13/18 on evaluation today patient actually appears to be doing better in regard to her toe ulcer. She's been using the Prisma on this region and that seems to have done extremely well for her. Fortunately there does not appear to be evidence of infection at this time which is good news. She did see her surgeon they were extremely pleased with the overall appearance of her wound. 06/27/18 on evaluation today patient actually appears to be doing very well in regard to her foot ulcer. She has been tolerating the dressing changes without complication which is great news. She did see her podiatrist they felt like everything is looking very nice as well and will please. 07/05/2018 surgical wound on the left foot. She appears to be doing well. Still surface debrided to  remove however in general post debridement the wound bed looks quite healthy it is come down significantly in terms of dimensions using silver collagen. The patient describes pain when her foot is elevated either in bed at night [sometimes keeps her awake] or when is propped up on a foot rest. She basically takes analgesics for this. She does not really describe claudication with activity however her activity is very limited by interstitial lung disease. Her ABIs initially in this clinic were noncompressible. She is not a diabetic 07/20/2018; surgical wound on the left foot. Wound actually looks quite a bit better than I remember seeing this. She is still describing pain with her leg elevated at night that she does not get when the wound is supine. She does not really describe claudication but she is very limited by her pulmonary status. We have been using silver collagen. 1/17; the patient has been followed up by orthopedics and discharge. Her arterial studies were actually quite good and should not be contributing to any pain. Slight reduction in ABIs but otherwise normal. Her wound is closed. She saw Dr. Linus Salmons of infectious disease surrounding the surgery. By review of our notes she was not felt to have osteomyelitis. 06/03/2022 Ms. Nickie Woloszyk is a 77 year old female with a past medical history of interstitial lung disease on chronic oxygen via nasal cannula, rheumatoid arthritis, chronic diastolic heart failure and OSA that presents to the clinic for a 1 to 70-monthhistory of nonhealing ulcer to the left lower extremity. On 04/20/2022 she was admitted to the hospital for left lower extremity cellulitis. She required 8 days of IV vancomycin and Unasyn followed by a 14-day course of Augmentin and doxycycline. She has been using Silvadene cream to the area. She does not use compression therapy but does own compression stockings. She currently denies signs of infection. 11/27; patient presents for  follow-up she is using Medihoney and Hydrofera Blue to the wound beds. She has been using her Tubigrip. She has no issues or complaints today. 12/4; patient presents for follow-up. She continues to use Medihoney and Hydrofera Blue to the wound beds. Several attempts have been made to schedule ABIs with TBI's. We gave patient the number today to call to have these scheduled. She has been using Tubigrip. She has no issues or complaints today. 12/18;  she continues to use Medihoney and Hydrofera Blue in general the surface of the wound is looking somewhat better this week and measurements are slightly smaller. She did have her arterial studies done these were noncompressible bilaterally however TBI on the right of 0.69 on the left 0.62. Waveforms on the right were triphasic on the left biphasic to triphasic. This does not suggest significant arterial disease to make healing wounds at this location difficult. 12/29; patient presents for follow-up. She has been using Medihoney and Hydrofera Blue to the wound bed. She has been using Tubigrip. 1/5; patient presents for follow-up. She is been using Medihoney and Hydrofera Blue to the wound bed along with Tubigrip. The Tubigrip was doubled at last clinic visit. There is been improvement in wound healing. 1/12; patient presents for follow-up. She has been using Medihoney and Hydrofera Blue to the wound bed along with Tubigrip. There continues to be improvement in wound healing. 1/19; patient presents for follow-up. She has been using Medihoney and Hydrofera Blue to the wound bed along with her juxta light compression daily. There is been improvement in wound measurements. Patient has no issues or complaints today. 1/25; patient presents for follow-up. She has been using blast X and collagen to the wound bed under juxta light compression daily. She has no issues or complaints today. There is been improvement in wound healing. 2/1; patient presents for follow-up.  She has been using blast X and collagen to the wound bed under juxta light compression daily. She has no issues or complaints today. 2/9; patient presents for follow-up. She has been using Hydrofera Blue with blast X under compression wrap daily. She has been approved for a skin substitute and patient would like to proceed with this. 2/15; patient presents for follow-up. PuraPly was placed in standard fashion to the large anterior leg wound. She has been using blast X and Hydrofera Blue to the more proximal small wound. She has been using her juxta light compression daily. She has no issues or complaints today. 2/23; patient presents for follow-up. PuraPly was placed in standard fashion to the large anterior leg wound. The more proximal small wound is almost healed and they have been using blast X and Hydrofera Blue. She has been using her juxta light compression daily to the left lower extremity. There is been improvement in wound healing. 2/29; patient presents for follow-up. PuraPly was placed in standard fashion to the large anterior leg wound. The more proximal wound has healed. She is been using her juxta light compression daily. She has no issues or complaints today. Patient History Information obtained from Patient. Family History Cancer - Maternal Grandparents, Diabetes - Siblings, Heart Disease - Maternal Grandparents,Paternal Grandparents,Mother,Father,Siblings, Hypertension - Mother,Father,Siblings, Stroke - Paternal Grandparents, No family history of Hereditary Spherocytosis, Kidney Disease, Lung Disease, Seizures, Thyroid Problems, Tuberculosis. Social History Never smoker, Marital Status - Married, Alcohol Use - Moderate, Drug Use - No History, Caffeine Use - Moderate. Medical History NASHAI, VANDENBROEK ([id number]) [id number] Page 7 of 11 Eyes Denies history of Cataracts, Optic Neuritis Ear/Nose/Mouth/Throat Denies history of Chronic sinus  problems/congestion Hematologic/Lymphatic Denies history of Anemia, Hemophilia, Human Immunodeficiency Virus, Lymphedema, Sickle Cell Disease Respiratory Patient has history of Asthma, Sleep Apnea Denies history of Aspiration, Chronic Obstructive Pulmonary Disease (COPD), Pneumothorax, Tuberculosis Cardiovascular Patient has history of Hypertension Denies history of Angina, Arrhythmia, Congestive Heart Failure, Coronary Artery Disease, Deep Vein Thrombosis, Hypotension, Myocardial Infarction, Peripheral Arterial Disease, Peripheral Venous Disease, Phlebitis, Vasculitis Gastrointestinal Patient has history of Colitis Denies  history of Cirrhosis , Crohnoos, Hepatitis A, Hepatitis B, Hepatitis C Endocrine Denies history of Type I Diabetes, Type II Diabetes Immunological Denies history of Lupus Erythematosus, Raynaudoos, Scleroderma Integumentary (Skin) Denies history of History of Burn Musculoskeletal Patient has history of Rheumatoid Arthritis, Osteoarthritis Denies history of Gout, Osteomyelitis Neurologic Denies history of Dementia, Neuropathy, Quadriplegia, Paraplegia, Seizure Disorder Oncologic Denies history of Received Chemotherapy, Received Radiation Psychiatric Denies history of Anorexia/bulimia, Confinement Anxiety Hospitalization/Surgery History - surgery toe. - right hip replacement 12/2018. - heart cath 12/17/2021. Medical A Surgical History Notes nd Respiratory 4L [place] 02 pulmonary fibrosis Objective Constitutional respirations regular, non-labored and within target range for patient.. Vitals Time Taken: 12:40 PM, Height: 61 in, Weight: 186 lbs, BMI: 35.1, Temperature: 98.1 F, Pulse: 105 bpm, Respiratory Rate: 20 breaths/min, Blood Pressure: 157/79 mmHg. Cardiovascular 2+ dorsalis pedis/posterior tibialis pulses. Psychiatric pleasant and cooperative. General Notes: T the anterior aspect of left lower extremity there is an open wound with slough and granulation  tissue. The small proximal wound has o epithelialized. Good edema control. No signs of infection. Obvious deformity to the foot from previous surgeries. Integumentary (Hair, Skin) Wound #4 status is Healed - Epithelialized. Original cause of wound was Gradually Appeared. The date acquired was: 04/19/2022. The wound has been in treatment 14 weeks. The wound is located on the Left,Proximal,Anterior Lower Leg. The wound measures 0cm length x 0cm width x 0cm depth; 0cm^2 area and 0cm^3 volume. There is no tunneling or undermining noted. There is a none present amount of drainage noted. The wound margin is distinct with the outline attached to the wound base. There is no granulation within the wound bed. There is no necrotic tissue within the wound bed. The periwound skin appearance did not exhibit: Callus, Crepitus, Excoriation, Induration, Rash, Scarring, Dry/Scaly, Maceration, Atrophie Blanche, Cyanosis, Ecchymosis, Hemosiderin Staining, Mottled, Pallor, Rubor, Erythema. Periwound temperature was noted as No Abnormality. The periwound has tenderness on palpation. Wound #5 status is Open. Original cause of wound was Gradually Appeared. The date acquired was: 04/19/2022. The wound has been in treatment 14 weeks. The wound is located on the [facility] Lower Leg. The wound measures 4.6cm length x 2.8cm width x 0.1cm depth; 10.116cm^2 area and 1.012cm^3 volume. There is tendon and Fat Layer (Subcutaneous Tissue) exposed. There is no tunneling or undermining noted. There is a medium amount of serosanguineous drainage noted. The wound margin is distinct with the outline attached to the wound base. There is medium (34-66%) pink, pale granulation within the wound bed. There is a medium (34-66%) amount of necrotic tissue within the wound bed including Adherent Slough. The periwound skin appearance did not exhibit: Callus, Crepitus, Excoriation, Induration, Rash, Scarring, Dry/Scaly, Maceration, Atrophie  Blanche, Cyanosis, Ecchymosis, Hemosiderin Staining, Mottled, Pallor, Rubor, Erythema. Periwound temperature was noted as No Abnormality. The periwound has tenderness on palpation. 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 The anterior larger wound has improved in appearance and size. I debrided nonviable tissue. PuraPly was placed in standard fashion. I recommended continuing with juxta light compression daily to the left lower extremity. Follow-up in 1 week. Procedures Wound #5 Pre-procedure diagnosis of Wound #5 is a Cellulitis located on the Left,Distal,Anterior Lower Leg . There was a Excisional Skin/Subcutaneous Tissue Debridement with a total area  of 12.88 sq cm performed by Kalman Shan, DO. With the following instrument(s): Curette to remove Viable and Non-Viable tissue/material. Material removed includes Subcutaneous Tissue and Slough and after achieving pain control using Lidocaine 4% T opical Solution. A time out was conducted at 13:05, prior to the start of the procedure. A Minimum amount of bleeding was controlled with Pressure. The procedure was tolerated well with a pain level of 0 throughout and a pain level of 0 following the procedure. Post Debridement Measurements: 4.6cm length x 28cm width x 0.1cm depth; 10.116cm^3 volume. Character of Wound/Ulcer Post Debridement is improved. Post procedure Diagnosis Wound #5: Same as Pre-Procedure Pre-procedure diagnosis of Wound #5 is a Cellulitis located on the Left,Distal,Anterior Lower Leg. A skin graft procedure using a bioengineered skin substitute/cellular or tissue based  product was performed by Kalman Shan, DO. Puraply AM was applied and secured with Steri-Strips and adaptic. 12 sq cm of product was utilized and 0 sq cm was wasted. Post Application, no dressing was applied. A Time Out was conducted at 13:10, prior to the start of the procedure. The procedure was tolerated well with a pain level of 0 throughout and a pain level of 0 following the procedure. Post procedure Diagnosis Wound #5: Same as Pre-Procedure . Plan Follow-up Appointments: Return Appointment in 1 week. - Dr. Heber Ventura 09/23/2022 1015 Friday Return Appointment in 2 weeks. - Dr. Heber Pillsbury 09/30/2022 1100 Friday Anesthetic: (In clinic) Topical Lidocaine 5% applied to wound bed Cellular or Tissue Based Products: Wound #5 Left,Distal,Anterior Lower Leg: Cellular or Tissue Based Product Type: - 08/18/2022 insurance ran organogenesis- 100% covered. 08/26/2022 #1 Puraply AM applied. 09/01/2022 #2 Puraply AM applied. 09/09/2022 #3 Puraply AM applied to both wounds. 09/15/2022 #4 Puraply AM applied to wound. Cellular or Tissue Based Product applied to wound bed, secured with steri-strips, cover with Adaptic or Mepitel. (DO NOT REMOVE). Bathing/ Shower/ Hygiene: May shower with protection but do not get wound dressing(s) wet. Protect dressing(s) with water repellant cover (for example, large plastic bag) or a cast cover and may then take shower. Edema Control - Lymphedema / SCD / Other: Elevate legs to the level of the heart or above for 30 minutes daily and/or when sitting for 3-4 times a day throughout the day. Avoid standing for long periods of time. Exercise regularly Compression stocking or Garment 20-30 mm/Hg pressure to: - start wearing the juxtalite HD apply in the morning and remove at night left leg. Non Wound Condition: Protect area with: - buttock area with AandD ointment daily WOUND #5: - Lower Leg Wound Laterality: Left, Anterior, Distal Cleanser: Wound Cleanser (Generic) 1 x Per Week/30  Days Discharge Instructions: Cleanse the wound with wound cleanser prior to applying a clean dressing using gauze sponges, not tissue or cotton balls. Prim Dressing: Puraply #4 1 x Per Week/30 Days ary Discharge Instructions: applied by provider. Prim Dressing: Adaptic and steri-strips 1 x Per Week/30 Days ary Discharge Instructions: Used to secure Puraply AM. Leave in place. Do not remove. Secondary Dressing: Zetuvit Plus Silicone Border Dressing 5x5 (in/in) 1 x Per Week/30 Days Discharge Instructions: Apply silicone border over primary dressing as directed. 1. In office sharp debridement 2. PuraPly placed in standard fashion 3. Juxta light compression daily to the left lower extremity 4. Follow-up in 1 week Electronic Signature(s) Signed: 09/15/2022 4:08:18 PM By: Kalman Shan DO Entered By: Kalman Shan on 09/15/2022 13:49:04 [name] ([id number]) [id number] Page 9 of 11 -------------------------------------------------------------------------------- HxROS Details Patient Name: Date of Service: CO O  K, DO [name] 09/15/2022 12:45 PM Medical Record Number: [id number] Patient Account Number: [id number] Date of Birth/Sex: Treating RN: [birth year] (77 y.o. F) Primary Care Provider: Lorene [name] Other Clinician: Referring Provider: Treating Provider/Extender: [name] in Treatment: 14 Information Obtained From Patient Eyes Medical History: Negative for: Cataracts; Optic Neuritis Ear/Nose/Mouth/Throat Medical History: Negative for: Chronic sinus problems/congestion Hematologic/Lymphatic Medical History: Negative for: Anemia; Hemophilia; Human Immunodeficiency Virus; Lymphedema; Sickle Cell Disease Respiratory Medical History: Positive for: Asthma; Sleep Apnea Negative for: Aspiration; Chronic Obstructive Pulmonary Disease (COPD); Pneumothorax; Tuberculosis Past Medical History Notes: 4L [place]  02 pulmonary fibrosis Cardiovascular Medical History: Positive for: Hypertension Negative for: Angina; Arrhythmia; Congestive Heart Failure; Coronary Artery Disease; Deep Vein Thrombosis; Hypotension; Myocardial Infarction; Peripheral Arterial Disease; Peripheral Venous Disease; Phlebitis; Vasculitis Gastrointestinal Medical History: Positive for: Colitis Negative for: Cirrhosis ; Crohns; Hepatitis A; Hepatitis B; Hepatitis C Endocrine Medical History: Negative for: Type I Diabetes; Type II Diabetes Immunological Medical History: Negative for: Lupus Erythematosus; Raynauds; Scleroderma Integumentary (Skin) Medical History: Negative for: History of Burn Musculoskeletal Medical History: Positive for: Rheumatoid Arthritis; Osteoarthritis Negative for: Gout; Osteomyelitis Neurologic Medical History: Negative for: Dementia; Neuropathy; Quadriplegia; Paraplegia; Seizure Disorder Oncologic Medical History: Negative for: Received Chemotherapy; Received Radiation [place], [name] ([id number]) [id number] Page 10 of 11 Psychiatric Medical History: Negative for: Anorexia/bulimia; Confinement Anxiety Immunizations Pneumococcal Vaccine: Received Pneumococcal Vaccination: Yes Received Pneumococcal Vaccination On or After 60th Birthday: Yes Implantable Devices No devices added Hospitalization / Surgery History Type of Hospitalization/Surgery surgery toe right hip replacement 12/2018 heart cath 12/17/2021 Family and Social History Cancer: Yes - Maternal Grandparents; Diabetes: Yes - Siblings; Heart Disease: Yes - Maternal Grandparents,Paternal Grandparents,Mother,Father,Siblings; Hereditary Spherocytosis: No; Hypertension: Yes - Mother,Father,Siblings; Kidney Disease: No; Lung Disease: No; Seizures: No; Stroke: Yes - Paternal Grandparents; Thyroid Problems: No; Tuberculosis: No; Never smoker; Marital Status - Married; Alcohol Use: Moderate; Drug Use: No History;  Caffeine Use: Moderate; Financial Concerns: No; Food, Clothing or Shelter Needs: No; Support System Lacking: No; Transportation Concerns: No Electronic Signature(s) Signed: 09/15/2022 4:08:18 PM By: Kalman Shan DO Entered By: Kalman Shan on 09/15/2022 13:47:01 -------------------------------------------------------------------------------- SuperBill Details Patient Name: Date of Service: [name], DO [name] 09/15/2022 Medical Record Number: [id number] Patient Account Number: [id number] Date of Birth/Sex: Treating RN: January 11, 1946 (77 y.o. [name] Primary Care Provider: Lorene [name] Other Clinician: Referring Provider: Treating Provider/Extender: [name] in Treatment: 14 Diagnosis Coding ICD-10 Codes Code Description [phone] Non-pressure chronic ulcer of other part of left lower leg with other specified severity I87.312 Chronic venous hypertension (idiopathic) with ulcer of left lower extremity J84.9 Interstitial pulmonary disease, unspecified M06.9 Rheumatoid arthritis, unspecified Facility Procedures : CPT4 Code: [id number] Description: [id number] PuraPly AM 3X4 (12sq. cm) enter 12qty Modifier: Quantity: 12 : CPT4 Code: [id number] Description: [id number] - SKIN SUB GRAFT TRNK/ARM/LEG ICD-10 Diagnosis Description L97.828 Non-pressure chronic ulcer of other part of left lower leg with other specified s I87.312 Chronic venous hypertension (idiopathic) with ulcer of left lower extremity Modifier: everity Quantity: 1 Physician Procedures Electronic Signature(s) Signed: 09/15/2022 4:08:18 PM By: Kalman Shan DO Entered By: Kalman Shan on 09/15/2022 13:49:29

## 2022-09-19 ENCOUNTER — Telehealth: Payer: Self-pay

## 2022-09-19 ENCOUNTER — Other Ambulatory Visit: Payer: Self-pay

## 2022-09-19 DIAGNOSIS — Z79899 Other long term (current) drug therapy: Secondary | ICD-10-CM

## 2022-09-19 MED ORDER — VERAPAMIL HCL ER 120 MG PO TBCR: 120.0000 mg | EXTENDED_RELEASE_TABLET | Freq: Every day | ORAL | 3 refills | Status: DC

## 2022-09-19 NOTE — Addendum Note (Signed)
Addended by: Meryl Crutch on: 09/19/2022 10:08 AM   Modules accepted: Orders

## 2022-09-19 NOTE — Telephone Encounter (Addendum)
Per Dr. Saunders Revel, pt's pcp decreased verapamil to 120 mg daily and refill was sent for 300 mg. Nurse contacted pharmacy who confirmed pt had not picked up 300 mg dose. Nurse informed pharmacy to discontinue verapamil 300 mg and to refill 120 mg. Pharmacy verbalized understanding.  Pt made aware and also informed MD would like to have a BMP completed day of LEA. Pt verbalized understanding.

## 2022-09-22 ENCOUNTER — Other Ambulatory Visit: Payer: Self-pay | Admitting: Internal Medicine

## 2022-09-23 ENCOUNTER — Encounter (HOSPITAL_BASED_OUTPATIENT_CLINIC_OR_DEPARTMENT_OTHER): Payer: Medicare Other | Attending: Internal Medicine | Admitting: Internal Medicine

## 2022-09-23 DIAGNOSIS — I87312 Chronic venous hypertension (idiopathic) with ulcer of left lower extremity: Secondary | ICD-10-CM | POA: Diagnosis not present

## 2022-09-23 DIAGNOSIS — M069 Rheumatoid arthritis, unspecified: Secondary | ICD-10-CM | POA: Diagnosis not present

## 2022-09-23 DIAGNOSIS — L97822 Non-pressure chronic ulcer of other part of left lower leg with fat layer exposed: Secondary | ICD-10-CM | POA: Insufficient documentation

## 2022-09-23 DIAGNOSIS — L97828 Non-pressure chronic ulcer of other part of left lower leg with other specified severity: Secondary | ICD-10-CM

## 2022-09-23 DIAGNOSIS — J849 Interstitial pulmonary disease, unspecified: Secondary | ICD-10-CM | POA: Insufficient documentation

## 2022-09-24 NOTE — Progress Notes (Signed)
[name], [name] ([id number]) [id number] Page 1 of 11 Visit Report for 09/23/2022 Chief Complaint Document Details Patient Name: Date of Service: Hailey Courser, DO [name] 09/23/2022 10:15 A M Medical Record Number: [id number] Patient Account Number: [id number] Date of Birth/Sex: Treating RN: 1946-05-24 (77 y.o. F) Primary Care Provider: Lorene Dy Other Clinician: Referring Provider: Treating Provider/Extender: [name] in Treatment: 16 Information Obtained from: Patient Chief Complaint 06/03/2022; Left lower extremity wounds Electronic Signature(s) Signed: 09/23/2022 11:02:34 AM By: Kalman Shan DO Entered By: Kalman Shan on 09/23/2022 10:56:58 -------------------------------------------------------------------------------- Cellular or Tissue Based Product Details Patient Name: Date of Service: Hailey Courser, DO [name] 09/23/2022 10:15 A M Medical Record Number: [id number] Patient Account Number: [id number] Date of Birth/Sex: Treating RN: December 31, 1945 (77 y.o. [name], [name] Primary Care Provider: Lorene Dy Other Clinician: Referring Provider: Treating Provider/Extender: [name] in Treatment: 16 Cellular or Tissue Based Product Type Wound #5 Left,Distal,Anterior Lower Leg Applied to: Performed By: Physician Kalman Shan, DO Cellular or Tissue Based Product Type: Puraply AM Level of Consciousness (Pre-procedure): Awake and Alert Pre-procedure Verification/Time Out Yes - 10:45 Taken: Location: trunk / arms / legs Wound Size (sq cm): 10.81 Product Size (sq cm): 12 Waste Size (sq cm): 0 Amount of Product Applied (sq cm): 12 Instrument Used: Forceps, Scissors Lot #: [id number] Order #: 5 Expiration Date: 08/15/2024 Fenestrated: No Reconstituted: Yes Solution Type: Normal saline Solution Amount: 26m Lot #: [id number] Expiration Date: 12/15/2024 Secured: Yes Secured  With: Steri-Strips, adaptic Dressing Applied: No Procedural Pain: 0 Post Procedural Pain: 0 Response to Treatment: Procedure was tolerated well Level of Consciousness (Post- Awake and Alert procedure): Post Procedure Diagnosis Same as Pre-procedure Electronic Signature(s) Signed: 09/23/2022 11:02:34 AM By: HKalman ShanDO Signed: 09/23/2022 3:03:33 PM By: DDeon PillingRN, BSN Entered By: DDeon Pillingon 09/23/2022 10:48:44 CBarrie Dunker([id number] Page 2 of 11 -------------------------------------------------------------------------------- Debridement Details Patient Name: Date of Service: CArnetha Courser DO [name] 09/23/2022 10:15 A M Medical Record Number: [id number] Account Number: [id number] of Birth/Sex: Treating RN: 909-11-1945(77y.o. FHelene [name] BMeta.RedingPrimary Care Provider: RLorene DyOther Clinician: Referring Provider: Treating Provider/Extender: HKrista Bluein Treatment: 16 Debridement Performed for Assessment: Wound #5 Left,Distal,Anterior Lower Leg Performed By: Physician HKalman Shan DO Debridement Type: Debridement Level of Consciousness (Pre-procedure): Awake and Alert Pre-procedure Verification/Time Out Yes - 10:35 Taken: Start Time: 10:36 Pain Control: Lidocaine 4% T opical Solution T Area Debrided (L x W): otal 4.7 (cm) x 2.3 (cm) = 10.81 (cm) Tissue and other material debrided: Viable, Non-Viable, Slough, Subcutaneous, Skin: Dermis , Skin: Epidermis, Slough Level: Skin/Subcutaneous Tissue Debridement Description: Excisional Instrument: Curette Bleeding: Minimum Hemostasis Achieved: Pressure End Time: 10:44 Procedural Pain: 0 Post Procedural Pain: 0 Response to Treatment: Procedure was tolerated well Level of Consciousness (Post- Awake and Alert procedure): Post Debridement Measurements of Total Wound Length: (cm) 4.7 Width: (cm) 2.3 Depth: (cm) 0.1 Volume:  (cm) 0.849 Character of Wound/Ulcer Post Debridement: Improved Post Procedure Diagnosis Same as Pre-procedure Electronic Signature(s) Signed: 09/23/2022 11:02:34 AM By: HKalman ShanDO Signed: 09/23/2022 3:03:33 PM By: DDeon PillingRN, BSN Entered By: DDeon Pillingon 09/23/2022 10:46:42 -------------------------------------------------------------------------------- HPI Details Patient Name: Date of Service: CArnetha Courser DO [name] 09/23/2022 10:15 A M Medical Record Number: [id number] Account Number: [id number] of Birth/Sex: Treating RN: 902/19/1947((77y.o. F) Primary Care Provider: RLorene DyOther Clinician: Referring Provider:  Treating Provider/Extender: [name] in Treatment: 16 History of Present Illness HPI Description: Patient presents today for initial evaluation and our clinic as a [name] for evaluation and treatment of the wound to his his of the left foot at the base of the great toe. Currently the good news is the patient really does not have any significant pain which is excellent. She has been seen by [name] that is the [name]. Subsequently the patient was cultured and positive for methicillin sensitive staph aureus. She has been on antibiotics including Cipro prior to surgery as will several days following surgery. She then had clindamycin which was changed to doxycycline. She took this for 10 days. Subsequently the pain had improved and there was no longer any possible draining from the wound according to the notes. The patient sedentary rate as well as C-reactive protein have returned to normal ranges. Currently per [name] assessment there was one dehiscence with no sign of osteomyelitis. He therefore discontinue the antibiotics with the completion of the last three days Of doxycycline.  Other than that he just set her for a follow-up as needed. The patient does not have diabetes and is not a current smoker. At this time her treatment that was recommended by the surgeon was applying a Hydrocolloid dressing. Based on what I'm seeing at this point the patient actually has some Slough noted over the surface of the wound there really does not appear to be evidence of infection though I do think she does require some sharp debridement to clear away the slough and help with appropriate wound healing. She does have areas of granulation buds noted. She may be a candidate for a skin substitute as well. We will see how things do over the next period of time until follow-up. No fevers, chills, nausea, or vomiting noted at this time. [name], [name] ([id number]) [id number] Page 3 of 11 06/13/18 on evaluation today patient actually appears to be doing better in regard to her toe ulcer. She's been using the Prisma on this region and that seems to have done extremely well for her. Fortunately there does not appear to be evidence of infection at this time which is good news. She did see her surgeon they were extremely pleased with the overall appearance of her wound. 06/27/18 on evaluation today patient actually appears to be doing very well in regard to her foot ulcer. She has been tolerating the dressing changes without complication which is great news. She did see her podiatrist they felt like everything is looking very nice as well and will please. 07/05/2018 surgical wound on the left foot. She appears to be doing well. Still surface debrided to remove however in general post debridement the wound bed looks quite healthy it is come down significantly in terms of dimensions using silver collagen. The patient describes pain when her foot is elevated either in bed at night [sometimes keeps her awake] or when is propped up on a foot rest. She basically takes analgesics  for this. She does not really describe claudication with activity however her activity is very limited by interstitial lung disease. Her ABIs initially in this clinic were noncompressible. She is not a diabetic 07/20/2018; surgical wound on the left foot. Wound actually looks quite a bit better than I remember seeing this. She is still describing pain with her leg elevated at night that  she does not get when the wound is supine. She does not really describe claudication but she is very limited by her pulmonary status. We have been using silver collagen. 1/17; the patient has been followed up by orthopedics and discharge. Her arterial studies were actually quite good and should not be contributing to any pain. Slight reduction in ABIs but otherwise normal. Her wound is closed. She saw [name] surrounding the surgery. By review of our notes she was not felt to have osteomyelitis. 06/03/2022 Ms. Alencia Magstadt is a 77 year old female with a past medical history of interstitial lung disease on chronic oxygen via nasal cannula, rheumatoid arthritis, chronic diastolic heart failure and OSA that presents to the clinic for a 1 to 77-monthhistory of nonhealing ulcer to the left lower extremity. On 04/20/2022 she was admitted to the [name] for left lower extremity cellulitis. She required 8 days of IV vancomycin and Unasyn followed by a 14-day course of Augmentin and doxycycline. She has been using Silvadene cream to the area. She does not use compression therapy but does own compression stockings. She currently denies signs of infection. 11/27; patient presents for follow-up she is using Medihoney and Hydrofera Blue to the wound beds. She has been using her Tubigrip. She has no issues or complaints today. 12/4; patient presents for follow-up. She continues to use Medihoney and Hydrofera Blue to the wound beds. Several attempts have been made to schedule ABIs with TBI's. We gave patient  the number today to call to have these scheduled. She has been using Tubigrip. She has no issues or complaints today. 12/18; she continues to use Medihoney and Hydrofera Blue in general the surface of the wound is looking somewhat better this week and measurements are slightly smaller. She did have her arterial studies done these were noncompressible bilaterally however TBI on the right of 0.69 on the left 0.62. Waveforms on the right were triphasic on the left biphasic to triphasic. This does not suggest significant arterial disease to make healing wounds at this location difficult. 12/29; patient presents for follow-up. She has been using Medihoney and Hydrofera Blue to the wound bed. She has been using Tubigrip. 1/5; patient presents for follow-up. She is been using Medihoney and Hydrofera Blue to the wound bed along with Tubigrip. The Tubigrip was doubled at last clinic visit. There is been improvement in wound healing. 1/12; patient presents for follow-up. She has been using Medihoney and Hydrofera Blue to the wound bed along with Tubigrip. There continues to be improvement in wound healing. 1/19; patient presents for follow-up. She has been using Medihoney and Hydrofera Blue to the wound bed along with her juxta light compression daily. There is been improvement in wound measurements. Patient has no issues or complaints today. 1/25; patient presents for follow-up. She has been using blast X and collagen to the wound bed under juxta light compression daily. She has no issues or complaints today. There is been improvement in wound healing. 2/1; patient presents for follow-up. She has been using blast X and collagen to the wound bed under juxta light compression daily. She has no issues or complaints today. 2/9; patient presents for follow-up. She has been using Hydrofera Blue with blast X under compression wrap daily. She has been approved for a skin substitute and patient would like to proceed  with this. 2/15; patient presents for follow-up. PuraPly was placed in standard fashion to the large anterior leg wound. She has been using blast X and  Hydrofera Blue to the more proximal small wound. She has been using her juxta light compression daily. She has no issues or complaints today. 2/23; patient presents for follow-up. PuraPly was placed in standard fashion to the large anterior leg wound. The more proximal small wound is almost healed and they have been using blast X and Hydrofera Blue. She has been using her juxta light compression daily to the left lower extremity. There is been improvement in wound healing. 2/29; patient presents for follow-up. PuraPly was placed in standard fashion to the large anterior leg wound. The more proximal wound has healed. She is been using her juxta light compression daily. She has no issues or complaints today. 3/8; patient presents for follow-up. PuraPly has been used to the wound bed weekly patient has done well with this. She has been using her juxta light compression daily. She has no issues or complaints today. Electronic Signature(s) Signed: 09/23/2022 11:02:34 AM By: Kalman Shan DO Entered By: Kalman Shan on 09/23/2022 10:57:27 -------------------------------------------------------------------------------- Physical Exam Details Patient Name: Date of Service: Hailey Courser, DO [name] 09/23/2022 10:15 A M Medical Record Number: [id number] Patient Account Number: [id number] Date of Birth/Sex: Treating RN: 07-17-1946 (77 y.o. F) Primary Care Provider: Lorene Dy Other Clinician: Referring Provider: Treating Provider/Extender: [name] in Treatment: [address], [place] ([id number]) [id number] Page 4 of 11 Constitutional respirations regular, non-labored and within target range for patient.. Cardiovascular 2+ dorsalis pedis/posterior tibialis pulses. Psychiatric pleasant and  cooperative. Notes T the anterior aspect of left lower extremity there is an open wound with slough and granulation tissue. No signs of infection. Obvious deformity to the foot o from previous surgeries. Electronic Signature(s) Signed: 09/23/2022 11:02:34 AM By: Kalman Shan DO Entered By: Kalman Shan on 09/23/2022 10:58:22 -------------------------------------------------------------------------------- Physician Orders Details Patient Name: Date of Service: Hailey Courser, DO [name] 09/23/2022 10:15 A M Medical Record Number: [id number] Patient Account Number: [id number] Date of Birth/Sex: Treating RN: 05-23-1946 (77 y.o. [name], [name] Primary Care Provider: Lorene Dy Other Clinician: Referring Provider: Treating Provider/Extender: [name] in Treatment: 16 Verbal / Phone Orders: No Diagnosis Coding ICD-10 Coding Code Description 8068400086 Non-pressure chronic ulcer of other part of left lower leg with other specified severity I87.312 Chronic venous hypertension (idiopathic) with ulcer of left lower extremity J84.9 Interstitial pulmonary disease, unspecified M06.9 Rheumatoid arthritis, unspecified Follow-up Appointments ppointment in 1 week. - Dr. Heber [place] 09/30/2022 1100 Friday Return A ppointment in 2 weeks. - Dr. Heber [place] 1015 Friday 10/07/2022 Return A Anesthetic (In clinic) Topical Lidocaine 5% applied to wound bed Cellular or Tissue Based Products Wound #5 Left,Distal,Anterior Lower Leg Cellular or Tissue Based Product Type: - 08/18/2022 insurance ran organogenesis- 100% covered. 08/26/2022 #1 Puraply AM applied. 09/01/2022 #2 Puraply AM applied. 09/09/2022 #3 Puraply AM applied to both wounds. 09/15/2022 #4 Puraply AM applied to wound. 09/23/2022 #5 Puraply AM applied to wound. Cellular or Tissue Based Product applied to wound bed, secured with steri-strips, cover with Adaptic or Mepitel. (DO NOT REMOVE). Bathing/ Shower/ Hygiene May shower  with protection but do not get wound dressing(s) wet. Protect dressing(s) with water repellant cover (for example, large plastic bag) or a cast cover and may then take shower. Edema Control - Lymphedema / SCD / Other Elevate legs to the level of the heart or above for 30 minutes daily and/or when sitting for 3-4 times a day throughout the day. Avoid standing for long periods of time. Exercise regularly Compression  stocking or Garment 20-30 mm/Hg pressure to: - start wearing the juxtalite HD apply in the morning and remove at night left leg. Non Wound Condition Protect area with: - buttock area with AandD ointment daily Wound Treatment Wound #5 - Lower Leg Wound Laterality: Left, Anterior, Distal Cleanser: Wound Cleanser (Generic) 1 x Per Week/30 Days Discharge Instructions: Cleanse the wound with wound cleanser prior to applying a clean dressing using gauze sponges, not tissue or cotton balls. Prim Dressing: Puraply #5 ary 1 x Per Week/30 Days [name], [name] ([id number]) [phone].pdf Page 5 of 11 Discharge Instructions: applied by provider. Prim Dressing: Adaptic and steri-strips 1 x Per Week/30 Days ary Discharge Instructions: Used to secure Puraply AM. Leave in place. Do not remove. Secondary Dressing: Zetuvit Plus Silicone Border Dressing 5x5 (in/in) 1 x Per Week/30 Days Discharge Instructions: Apply silicone border over primary dressing as directed. Electronic Signature(s) Signed: 09/23/2022 11:02:34 AM By: Kalman Shan DO Entered By: Kalman Shan on 09/23/2022 10:58:32 -------------------------------------------------------------------------------- Problem List Details Patient Name: Date of Service: Hailey [name], DO [name] 09/23/2022 10:15 A M Medical Record Number: [id number] Patient Account Number: [id number] Date of Birth/Sex: Treating RN: 11/01/1945 (77 y.o. [name], [name] Primary Care Provider: Lorene Dy Other Clinician: Referring  Provider: Treating Provider/Extender: [name] in Treatment: 16 Active Problems ICD-10 Encounter Code Description Active Date MDM Diagnosis L97.828 Non-pressure chronic ulcer of other part of left lower leg with other specified 06/03/2022 No Yes severity I87.312 Chronic venous hypertension (idiopathic) with ulcer of left lower extremity 06/03/2022 No Yes J84.9 Interstitial pulmonary disease, unspecified 06/03/2022 No Yes M06.9 Rheumatoid arthritis, unspecified 06/03/2022 No Yes Inactive Problems Resolved Problems Electronic Signature(s) Signed: 09/23/2022 11:02:34 AM By: Kalman Shan DO Entered By: Kalman Shan on 09/23/2022 10:54:07 -------------------------------------------------------------------------------- Progress Note Details Patient Name: Date of Service: Hailey [name], DO [name] 09/23/2022 10:15 A M Medical Record Number: [id number] Patient Account Number: [id number] Date of Birth/Sex: Treating RN: 05-05-1946 (77 y.o. F) Primary Care Provider: Lorene Dy Other Clinician: Referring Provider: Treating Provider/Extender: [name] in Treatment: 16 Subjective Chief Complaint Information obtained from Patient [name], [name] ([id number]) [id number] Page 6 of 11 06/03/2022; Left lower extremity wounds History of Present Illness (HPI) Patient presents today for initial evaluation and our clinic as a referral from the [place] [facility] Department of orthopedics for evaluation and treatment of the wound to his his of the left foot at the base of the great toe. Currently the good news is the patient really does not have any significant pain which is excellent. She has been seen by [name] at [facility]): infectious disease clinic that is the regional [name] for infectious disease. Subsequently the patient was cultured and positive for methicillin sensitive staph aureus. She has  been on antibiotics including Cipro prior to surgery as will several days following surgery. She then had clindamycin which was changed to doxycycline. She took this for 10 days. Subsequently the pain had improved and there was no longer any possible draining from the wound according to the notes. The patient sedentary rate as well as C-reactive protein have returned to normal ranges. Currently per [name] assessment there was one dehiscence with no sign of osteomyelitis. He therefore discontinue the antibiotics with the completion of the last three days Of doxycycline. Other than that he just set her for a follow-up as needed. The patient does not have diabetes and is not a current smoker. At this time  her treatment that was recommended by the surgeon was applying a Hydrocolloid dressing. Based on what I'm seeing at this point the patient actually has some Slough noted over the surface of the wound there really does not appear to be evidence of infection though I do think she does require some sharp debridement to clear away the slough and help with appropriate wound healing. She does have areas of granulation buds noted. She may be a candidate for a skin substitute as well. We will see how things do over the next period of time until follow-up. No fevers, chills, nausea, or vomiting noted at this time. 06/13/18 on evaluation today patient actually appears to be doing better in regard to her toe ulcer. She's been using the Prisma on this region and that seems to have done extremely well for her. Fortunately there does not appear to be evidence of infection at this time which is good news. She did see her surgeon they were extremely pleased with the overall appearance of her wound. 06/27/18 on evaluation today patient actually appears to be doing very well in regard to her foot ulcer. She has been tolerating the dressing changes without complication which is great news. She did see her podiatrist  they felt like everything is looking very nice as well and will please. 07/05/2018 surgical wound on the left foot. She appears to be doing well. Still surface debrided to remove however in general post debridement the wound bed looks quite healthy it is come down significantly in terms of dimensions using silver collagen. The patient describes pain when her foot is elevated either in bed at night [sometimes keeps her awake] or when is propped up on a foot rest. She basically takes analgesics for this. She does not really describe claudication with activity however her activity is very limited by interstitial lung disease. Her ABIs initially in this clinic were noncompressible. She is not a diabetic 07/20/2018; surgical wound on the left foot. Wound actually looks quite a bit better than I remember seeing this. She is still describing pain with her leg elevated at night that she does not get when the wound is supine. She does not really describe claudication but she is very limited by her pulmonary status. We have been using silver collagen. 1/17; the patient has been followed up by orthopedics and discharge. Her arterial studies were actually quite good and should not be contributing to any pain. Slight reduction in ABIs but otherwise normal. Her wound is closed. She saw [name] surrounding the surgery. By review of our notes she was not felt to have osteomyelitis. 06/03/2022 Ms. [name] is a 77 year old female with a past medical history of interstitial lung disease on chronic oxygen via nasal cannula, rheumatoid arthritis, chronic diastolic heart failure and OSA that presents to the clinic for a 1 to 84-monthhistory of nonhealing ulcer to the left lower extremity. On 04/20/2022 she was admitted to the [name] for left lower extremity cellulitis. She required 8 days of IV vancomycin and Unasyn followed by a 14-day course of Augmentin and doxycycline. She has been using  Silvadene cream to the area. She does not use compression therapy but does own compression stockings. She currently denies signs of infection. 11/27; patient presents for follow-up she is using Medihoney and Hydrofera Blue to the wound beds. She has been using her Tubigrip. She has no issues or complaints today. 12/4; patient presents for follow-up. She continues to use Medihoney and Hydrofera  Blue to the wound beds. Several attempts have been made to schedule ABIs with TBI's. We gave patient the number today to call to have these scheduled. She has been using Tubigrip. She has no issues or complaints today. 12/18; she continues to use Medihoney and Hydrofera Blue in general the surface of the wound is looking somewhat better this week and measurements are slightly smaller. She did have her arterial studies done these were noncompressible bilaterally however TBI on the right of 0.69 on the left 0.62. Waveforms on the right were triphasic on the left biphasic to triphasic. This does not suggest significant arterial disease to make healing wounds at this location difficult. 12/29; patient presents for follow-up. She has been using Medihoney and Hydrofera Blue to the wound bed. She has been using Tubigrip. 1/5; patient presents for follow-up. She is been using Medihoney and Hydrofera Blue to the wound bed along with Tubigrip. The Tubigrip was doubled at last clinic visit. There is been improvement in wound healing. 1/12; patient presents for follow-up. She has been using Medihoney and Hydrofera Blue to the wound bed along with Tubigrip. There continues to be improvement in wound healing. 1/19; patient presents for follow-up. She has been using Medihoney and Hydrofera Blue to the wound bed along with her juxta light compression daily. There is been improvement in wound measurements. Patient has no issues or complaints today. 1/25; patient presents for follow-up. She has been using blast X and collagen to  the wound bed under juxta light compression daily. She has no issues or complaints today. There is been improvement in wound healing. 2/1; patient presents for follow-up. She has been using blast X and collagen to the wound bed under juxta light compression daily. She has no issues or complaints today. 2/9; patient presents for follow-up. She has been using Hydrofera Blue with blast X under compression wrap daily. She has been approved for a skin substitute and patient would like to proceed with this. 2/15; patient presents for follow-up. PuraPly was placed in standard fashion to the large anterior leg wound. She has been using blast X and Hydrofera Blue to the more proximal small wound. She has been using her juxta light compression daily. She has no issues or complaints today. 2/23; patient presents for follow-up. PuraPly was placed in standard fashion to the large anterior leg wound. The more proximal small wound is almost healed and they have been using blast X and Hydrofera Blue. She has been using her juxta light compression daily to the left lower extremity. There is been improvement in wound healing. 2/29; patient presents for follow-up. PuraPly was placed in standard fashion to the large anterior leg wound. The more proximal wound has healed. She is been using her juxta light compression daily. She has no issues or complaints today. 3/8; patient presents for follow-up. PuraPly has been used to the wound bed weekly patient has done well with this. She has been using her juxta light compression daily. She has no issues or complaints today. Patient History Information obtained from Patient. Family History [name], [name] ([id number]) [id number] Page 7 of 11 Cancer - Maternal Grandparents, Diabetes - Siblings, Heart Disease - Maternal Grandparents,Paternal Grandparents,Mother,Father,Siblings, Hypertension - Mother,Father,Siblings, Stroke - Paternal  Grandparents, No family history of Hereditary Spherocytosis, Kidney Disease, Lung Disease, Seizures, Thyroid Problems, Tuberculosis. Social History Never smoker, Marital Status - Married, Alcohol Use - Moderate, Drug Use - No History, Caffeine Use - Moderate. Medical History Eyes Denies history of Cataracts, Optic Neuritis Ear/Nose/Mouth/Throat  Denies history of Chronic sinus problems/congestion Hematologic/Lymphatic Denies history of Anemia, Hemophilia, Human Immunodeficiency Virus, Lymphedema, Sickle Cell Disease Respiratory Patient has history of Asthma, Sleep Apnea Denies history of Aspiration, Chronic Obstructive Pulmonary Disease (COPD), Pneumothorax, Tuberculosis Cardiovascular Patient has history of Hypertension Denies history of Angina, Arrhythmia, Congestive Heart Failure, Coronary Artery Disease, Deep Vein Thrombosis, Hypotension, Myocardial Infarction, Peripheral Arterial Disease, Peripheral Venous Disease, Phlebitis, Vasculitis Gastrointestinal Patient has history of Colitis Denies history of Cirrhosis , Crohnoos, Hepatitis A, Hepatitis B, Hepatitis C Endocrine Denies history of Type I Diabetes, Type II Diabetes Immunological Denies history of Lupus Erythematosus, Raynaudoos, Scleroderma Integumentary (Skin) Denies history of History of Burn Musculoskeletal Patient has history of Rheumatoid Arthritis, Osteoarthritis Denies history of Gout, Osteomyelitis Neurologic Denies history of Dementia, Neuropathy, Quadriplegia, Paraplegia, Seizure Disorder Oncologic Denies history of Received Chemotherapy, Received Radiation Psychiatric Denies history of Anorexia/bulimia, Confinement Anxiety Hospitalization/Surgery History - surgery toe. - right hip replacement 12/2018. - heart cath 12/17/2021. Medical A Surgical History Notes nd Respiratory 4L [place] 02 pulmonary fibrosis Objective Constitutional respirations regular, non-labored and within target range for patient.. Vitals  Time Taken: 10:20 AM, Height: 61 in, Weight: 186 lbs, BMI: 35.1, Temperature: 98.6 F, Pulse: 82 bpm, Respiratory Rate: 20 breaths/min, Blood Pressure: 124/54 mmHg. Cardiovascular 2+ dorsalis pedis/posterior tibialis pulses. Psychiatric pleasant and cooperative. General Notes: T the anterior aspect of left lower extremity there is an open wound with slough and granulation tissue. No signs of infection. Obvious o deformity to the foot from previous surgeries. Integumentary (Hair, Skin) Wound #5 status is Open. Original cause of wound was Gradually Appeared. The date acquired was: 04/19/2022. The wound has been in treatment 16 weeks. The wound is located on the [name] Lower Leg. The wound measures 4.7cm length x 2.3cm width x 0.1cm depth; 8.49cm^2 area and 0.849cm^3 volume. There is Fat Layer (Subcutaneous Tissue) exposed. There is no tunneling or undermining noted. There is a medium amount of serosanguineous drainage noted. The wound margin is distinct with the outline attached to the wound base. There is large (67-100%) pink, pale granulation within the wound bed. There is a small (1-33%) amount of necrotic tissue within the wound bed including Adherent Slough. The periwound skin appearance did not exhibit: Callus, Crepitus, Excoriation, Induration, Rash, Scarring, Dry/Scaly, Maceration, Atrophie Blanche, Cyanosis, Ecchymosis, Hemosiderin Staining, Mottled, Pallor, Rubor, Erythema. Periwound temperature was noted as No Abnormality. 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 I debrided  nonviable tissue. PuraPly was placed in standard fashion. I recommended continuing compression therapy daily. Procedures Wound #5 Pre-procedure diagnosis of Wound #5 is a Cellulitis located on the Left,Distal,Anterior Lower Leg . There was a Excisional Skin/Subcutaneous Tissue Debridement with a total area of 10.81 sq cm performed by Kalman Shan, DO. With the following instrument(s): Curette to remove Viable and Non-Viable tissue/material. Material removed includes Subcutaneous Tissue, Slough, Skin: Dermis, and Skin: Epidermis after achieving pain control using Lidocaine 4% T opical Solution. A time out was conducted at 10:35, prior to the start of the procedure. A Minimum amount of bleeding was controlled with Pressure. The procedure was tolerated well with a pain level of 0 throughout and a pain level of 0 following the procedure. Post Debridement Measurements: 4.7cm length x  2.3cm width x 0.1cm depth; 0.849cm^3 volume. Character of Wound/Ulcer Post Debridement is improved. Post procedure Diagnosis Wound #5: Same as Pre-Procedure Pre-procedure diagnosis of Wound #5 is a Cellulitis located on the Left,Distal,Anterior Lower Leg. A skin graft procedure using a bioengineered skin substitute/cellular or tissue based product was performed by Kalman Shan, DO with the following instrument(s): Forceps and Scissors. Puraply AM was applied and secured with Steri-Strips and adaptic. 12 sq cm of product was utilized and 0 sq cm was wasted. Post Application, no dressing was applied. A Time Out was conducted at 10:45, prior to the start of the procedure. The procedure was tolerated well with a pain level of 0 throughout and a pain level of 0 following the procedure. Post procedure Diagnosis Wound #5: Same as Pre-Procedure . Plan Follow-up Appointments: Return Appointment in 1 week. - Dr. Heber [place] 09/30/2022 1100 Friday Return Appointment in 2 weeks. - Dr. Heber [place] 1015 Friday  10/07/2022 Anesthetic: (In clinic) Topical Lidocaine 5% applied to wound bed Cellular or Tissue Based Products: Wound #5 Left,Distal,Anterior Lower Leg: Cellular or Tissue Based Product Type: - 08/18/2022 insurance ran organogenesis- 100% covered. 08/26/2022 #1 Puraply AM applied. 09/01/2022 #2 Puraply AM applied. 09/09/2022 #3 Puraply AM applied to both wounds. 09/15/2022 #4 Puraply AM applied to wound. 09/23/2022 #5 Puraply AM applied to wound. Cellular or Tissue Based Product applied to wound bed, secured with steri-strips, cover with Adaptic or Mepitel. (DO NOT REMOVE). Bathing/ Shower/ Hygiene: May shower with protection but do not get wound dressing(s) wet. Protect dressing(s) with water repellant cover (for example, large plastic bag) or a cast cover and may then take shower. Edema Control - Lymphedema / SCD / Other: Elevate legs to the level of the heart or above for 30 minutes daily and/or when sitting for 3-4 times a day throughout the day. Avoid standing for long periods of time. Exercise regularly Compression stocking or Garment 20-30 mm/Hg pressure to: - start wearing the juxtalite HD apply in the morning and remove at night left leg. Non Wound Condition: Protect area with: - buttock area with AandD ointment daily WOUND #5: - Lower Leg Wound Laterality: Left, Anterior, Distal Cleanser: Wound Cleanser (Generic) 1 x Per Week/30 Days Discharge Instructions: Cleanse the wound with wound cleanser prior to applying a clean dressing using gauze sponges, not tissue or cotton balls. Prim Dressing: Puraply #5 1 x Per Week/30 Days ary Discharge Instructions: applied by provider. Prim Dressing: Adaptic and steri-strips 1 x Per Week/30 Days ary Discharge Instructions: Used to secure Puraply AM. Leave in place. Do not remove. Secondary Dressing: Zetuvit Plus Silicone Border Dressing 5x5 (in/in) 1 x Per Week/30 Days Discharge Instructions: Apply silicone border over primary dressing as directed. 1.  In office sharp debridement 2. PuraPly placed in standard fashion 3. Juxta light daily 4. Follow-up in 1 week Electronic Signature(s) Signed: 09/23/2022 11:02:34 AM By: Kalman Shan DO Entered By: Kalman Shan on 09/23/2022 10:59:45 [name] ([id number] Page 9 of 11 -------------------------------------------------------------------------------- HxROS Details Patient Name: Date of Service: Hailey Courser, DO [name] 09/23/2022 10:15 A M Medical Record Number: [id number] Patient Account Number: [id number] Date of Birth/Sex: Treating RN: 28-Jun-1946 (77 y.o. F) Primary Care Provider: Lorene Dy Other Clinician: Referring Provider: Treating Provider/Extender: [name] in Treatment: 16 Information Obtained From Patient Eyes Medical History: Negative for: Cataracts; Optic Neuritis Ear/Nose/Mouth/Throat Medical History: Negative for: Chronic sinus problems/congestion Hematologic/Lymphatic Medical History: Negative for: Anemia; Hemophilia; Human Immunodeficiency Virus; Lymphedema; Sickle Cell Disease Respiratory  Medical History: Positive for: Asthma; Sleep Apnea Negative for: Aspiration; Chronic Obstructive Pulmonary Disease (COPD); Pneumothorax; Tuberculosis Past Medical History Notes: 4L [place] 02 pulmonary fibrosis Cardiovascular Medical History: Positive for: Hypertension Negative for: Angina; Arrhythmia; Congestive Heart Failure; Coronary Artery Disease; Deep Vein Thrombosis; Hypotension; Myocardial Infarction; Peripheral Arterial Disease; Peripheral Venous Disease; Phlebitis; Vasculitis Gastrointestinal Medical History: Positive for: Colitis Negative for: Cirrhosis ; Crohns; Hepatitis A; Hepatitis B; Hepatitis C Endocrine Medical History: Negative for: Type I Diabetes; Type II Diabetes Immunological Medical History: Negative for: Lupus Erythematosus; Raynauds; Scleroderma Integumentary  (Skin) Medical History: Negative for: History of Burn Musculoskeletal Medical History: Positive for: Rheumatoid Arthritis; Osteoarthritis Negative for: Gout; Osteomyelitis Neurologic Medical History: Negative for: Dementia; Neuropathy; Quadriplegia; Paraplegia; Seizure Disorder Oncologic Medical History: [name], [name] ([id number]) [phone].pdf Page 10 of 11 Negative for: Received Chemotherapy; Received Radiation Psychiatric Medical History: Negative for: Anorexia/bulimia; Confinement Anxiety Immunizations Pneumococcal Vaccine: Received Pneumococcal Vaccination: Yes Received Pneumococcal Vaccination On or After 60th Birthday: Yes Implantable Devices No devices added Hospitalization / Surgery History Type of Hospitalization/Surgery surgery toe right hip replacement 12/2018 heart cath 12/17/2021 Family and Social History Cancer: Yes - Maternal Grandparents; Diabetes: Yes - Siblings; Heart Disease: Yes - Maternal Grandparents,Paternal Grandparents,Mother,Father,Siblings; Hereditary Spherocytosis: No; Hypertension: Yes - Mother,Father,Siblings; Kidney Disease: No; Lung Disease: No; Seizures: No; Stroke: Yes - Paternal Grandparents; Thyroid Problems: No; Tuberculosis: No; Never smoker; Marital Status - Married; Alcohol Use: Moderate; Drug Use: No History; Caffeine Use: Moderate; Financial Concerns: No; Food, Clothing or Shelter Needs: No; Support System Lacking: No; Transportation Concerns: No Electronic Signature(s) Signed: 09/23/2022 11:02:34 AM By: Kalman Shan DO Entered By: Kalman Shan on 09/23/2022 10:57:34 -------------------------------------------------------------------------------- SuperBill Details Patient Name: Date of Service: Hailey [name], DO [name] 09/23/2022 Medical Record Number: [id number] Patient Account Number: [id number] Date of Birth/Sex: Treating RN: 06/26/1946 (77 y.o. [name] Primary Care Provider: Lorene Dy Other  Clinician: Referring Provider: Treating Provider/Extender: [name] in Treatment: 16 Diagnosis Coding ICD-10 Codes Code Description [phone] Non-pressure chronic ulcer of other part of left lower leg with other specified severity I87.312 Chronic venous hypertension (idiopathic) with ulcer of left lower extremity J84.9 Interstitial pulmonary disease, unspecified M06.9 Rheumatoid arthritis, unspecified Facility Procedures : CPT4 Code: [id number] Description: [id number] PuraPly AM 3X4 (12sq. cm) enter 12qty Modifier: Quantity: 12 : CPT4 Code: [id number] Description: [id number] - SKIN SUB GRAFT TRNK/ARM/LEG ICD-10 Diagnosis Description L97.828 Non-pressure chronic ulcer of other part of left lower leg with other specified s Modifier: everity Quantity: 1 Physician Procedures : CPT4 Code Description Modifier [id number] - WC PHYS SKIN SUB GRAFT TRNK/ARM/LEG ICD-10 Diagnosis Description L97.828 Non-pressure chronic ulcer of other part of left lower leg with other specified severity Quantity: 1 Electronic Signature(s) [name], [name] ([id number]) [id number] Page 11 of 11 Signed: 09/23/2022 11:02:34 AM By: Kalman Shan DO Entered By: Kalman Shan on 09/23/2022 11:01:57

## 2022-09-28 ENCOUNTER — Other Ambulatory Visit (HOSPITAL_COMMUNITY): Payer: Self-pay | Admitting: Internal Medicine

## 2022-09-28 DIAGNOSIS — L97229 Non-pressure chronic ulcer of left calf with unspecified severity: Secondary | ICD-10-CM

## 2022-09-30 ENCOUNTER — Encounter (HOSPITAL_BASED_OUTPATIENT_CLINIC_OR_DEPARTMENT_OTHER): Payer: Medicare Other | Admitting: Internal Medicine

## 2022-09-30 DIAGNOSIS — I87312 Chronic venous hypertension (idiopathic) with ulcer of left lower extremity: Secondary | ICD-10-CM

## 2022-09-30 DIAGNOSIS — L97822 Non-pressure chronic ulcer of other part of left lower leg with fat layer exposed: Secondary | ICD-10-CM | POA: Diagnosis not present

## 2022-09-30 DIAGNOSIS — L97828 Non-pressure chronic ulcer of other part of left lower leg with other specified severity: Secondary | ICD-10-CM

## 2022-10-03 ENCOUNTER — Ambulatory Visit (HOSPITAL_COMMUNITY)
Admission: RE | Admit: 2022-10-03 | Discharge: 2022-10-03 | Disposition: A | Discharge status: Home / Self Care | Payer: Medicare Other | Source: Ambulatory Visit | Attending: Internal Medicine | Admitting: Internal Medicine

## 2022-10-03 DIAGNOSIS — L97229 Non-pressure chronic ulcer of left calf with unspecified severity: Secondary | ICD-10-CM | POA: Diagnosis present

## 2022-10-03 DIAGNOSIS — L97228 Non-pressure chronic ulcer of left calf with other specified severity: Secondary | ICD-10-CM | POA: Diagnosis present

## 2022-10-03 DIAGNOSIS — S81802A Unspecified open wound, left lower leg, initial encounter: Secondary | ICD-10-CM

## 2022-10-04 LAB — BASIC METABOLIC PANEL
BUN/Creatinine Ratio: 21 (ref 12–28)
BUN: 16 mg/dL (ref 8–27)
CO2: 26 mmol/L (ref 20–29)
Calcium: 10.2 mg/dL (ref 8.7–10.3)
Chloride: 103 mmol/L (ref 96–106)
Creatinine, Ser: 0.76 mg/dL (ref 0.57–1.00)
Glucose: 96 mg/dL (ref 70–99)
Potassium: 4.8 mmol/L (ref 3.5–5.2)
Sodium: 143 mmol/L (ref 134–144)
eGFR: 80 mL/min/{1.73_m2} (ref >59)

## 2022-10-04 LAB — VAS US ABI WITH/WO TBI

## 2022-10-05 ENCOUNTER — Telehealth: Payer: Self-pay | Admitting: Internal Medicine

## 2022-10-05 NOTE — Telephone Encounter (Signed)
I confirmed with [facility] that the copay is $0 for shipment going out tomorrow. I advised that it is very important for patient to continue Tyvaso as it is to for her ILD and PH. He states the grant money ran out of funds and she is not eligible to re-enroll until April 2024. I advised that there is another grant foundation open (PAN) for [place] that we can try for.  He sent [facility] check for $2000 last month but cannot afford this month to month - he is frustrated and overwhelmed but tries to ensure his wife has access to medications. I advised that we can help try to get pt enrolled into new grant through [place]. Unfortunately I've locked myself out of portal multiple times today.  I called patient's husband and provided him with update. I will try to work on this again tomorrow but unlikely to have success since I've been locked out again.  I provided him with PAN foundation phone number to try to enroll into grant. He said he will work on it. I'll call him again tomorrow for update  Knox Saliva, PharmD, MPH, BCPS, CPP Clinical Pharmacist (Rheumatology and Pulmonology)

## 2022-10-05 NOTE — Telephone Encounter (Signed)
Pt husband states the tyvaso is more than a house payment. They want 2600 a month. Pt husband wants to know wether or not she really need this medicine ort not

## 2022-10-05 NOTE — Telephone Encounter (Signed)
Devki or pharmacy team can you please assist. See previous encounter

## 2022-10-06 ENCOUNTER — Telehealth: Payer: Self-pay | Admitting: Pharmacist

## 2022-10-06 NOTE — Telephone Encounter (Signed)
Called pt's husband and confirm PAN foundation grant enrollment for pulmonary hypertension.  Total funds are $6500. [name] is active through 10/05/2023. He states he received email with billing information that he will provide to [facility] when needed. Nothing further needed.  Knox Saliva, PharmD, MPH, BCPS, CPP Clinical Pharmacist (Rheumatology and Pulmonology)

## 2022-10-06 NOTE — Telephone Encounter (Signed)
PT husband calling. His # is [phone]   Ms. D-  He got a grant thru the [employer] for his wife's meds. He wanted to let you know because you were trying too.

## 2022-10-07 ENCOUNTER — Encounter (HOSPITAL_BASED_OUTPATIENT_CLINIC_OR_DEPARTMENT_OTHER): Payer: Medicare Other | Admitting: Internal Medicine

## 2022-10-07 DIAGNOSIS — L97828 Non-pressure chronic ulcer of other part of left lower leg with other specified severity: Secondary | ICD-10-CM | POA: Diagnosis not present

## 2022-10-07 DIAGNOSIS — I87312 Chronic venous hypertension (idiopathic) with ulcer of left lower extremity: Secondary | ICD-10-CM

## 2022-10-07 DIAGNOSIS — L97822 Non-pressure chronic ulcer of other part of left lower leg with fat layer exposed: Secondary | ICD-10-CM | POA: Diagnosis not present

## 2022-10-10 NOTE — Progress Notes (Signed)
[name], [name] ([id number]) [id number] Page 1 of 11 Visit Report for 10/07/2022 Chief Complaint Document Details Patient Name: Date of Service: Hailey Courser, DO [name] 10/07/2022 10:15 A M Medical Record Number: [id number] Patient Account Number: [id number] Date of Birth/Sex: Treating RN: 1945/11/08 (77 y.o. F) Primary Care Provider: Lorene Dy Other Clinician: Referring Provider: Treating Provider/Extender: [name] in Treatment: 18 Information Obtained from: Patient Chief Complaint 06/03/2022; Left lower extremity wounds Electronic Signature(s) Signed: 10/07/2022 11:11:47 AM By: Kalman Shan DO Entered By: Kalman Shan on 10/07/2022 11:01:54 -------------------------------------------------------------------------------- Cellular or Tissue Based Product Details Patient Name: Date of Service: Hailey Courser, DO [name] 10/07/2022 10:15 A M Medical Record Number: [id number] Patient Account Number: [id number] Date of Birth/Sex: Treating RN: Apr 18, 1946 (77 y.o. F) Primary Care Provider: Lorene Dy Other Clinician: Referring Provider: Treating Provider/Extender: [name] in Treatment: 18 Cellular or Tissue Based Product Type Wound #5 Left,Distal,Anterior Lower Leg Applied to: Performed By: Physician Kalman Shan, DO Cellular or Tissue Based Product Type: Puraply AM Level of Consciousness (Pre-procedure): Awake and Alert Pre-procedure Verification/Time Out Yes - 10:35 Taken: Location: trunk / arms / legs Wound Size (sq cm): 11.04 Product Size (sq cm): 12 Waste Size (sq cm): 0 Amount of Product Applied (sq cm): 12 Instrument Used: Forceps, Scissors Lot #: [id number] Order #: [id number] Expiration Date: 08/15/2024 Fenestrated: No Reconstituted: Yes Solution Type: normal saline Solution Amount: 59ml Lot #: [id number] Solution Expiration Date: 12/15/2024 Secured: Yes Secured With:  Steri-Strips, adaptic Dressing Applied: No Procedural Pain: 0 Post Procedural Pain: 0 Response to Treatment: Procedure was tolerated well Level of Consciousness (Post- Awake and Alert procedure): Post Procedure Diagnosis Same as Pre-procedure Electronic Signature(s) Signed: 10/07/2022 11:11:47 AM By: Kalman Shan DO Signed: 10/10/2022 4:16:21 PM By: Erenest Blank Entered By: Erenest Blank on 10/07/2022 10:40:26 [name] ([id number]) [id number] Page 2 of 11 -------------------------------------------------------------------------------- Debridement Details Patient Name: Date of Service: Hailey Courser, DO [name] 10/07/2022 10:15 A M Medical Record Number: [id number] Patient Account Number: [id number] Date of Birth/Sex: Treating RN: 1945-09-28 (77 y.o. F) Primary Care Provider: Lorene Dy Other Clinician: Referring Provider: Treating Provider/Extender: [name] in Treatment: 18 Debridement Performed for Assessment: Wound #5 Left,Distal,Anterior Lower Leg Performed By: Physician Kalman Shan, DO Debridement Type: Debridement Level of Consciousness (Pre-procedure): Awake and Alert Pre-procedure Verification/Time Out Yes - 10:30 Taken: Start Time: 10:33 Pain Control: Lidocaine 4% T opical Solution T Area Debrided (L x W): otal 4.6 (cm) x 2.4 (cm) = 11.04 (cm) Tissue and other material debrided: Non-Viable, Slough, Slough Level: Non-Viable Tissue Debridement Description: Selective/Open Wound Instrument: Curette Bleeding: Minimum Hemostasis Achieved: Pressure End Time: 10:33 Procedural Pain: 0 Post Procedural Pain: 0 Response to Treatment: Procedure was tolerated well Level of Consciousness (Post- Awake and Alert procedure): Post Debridement Measurements of Total Wound Length: (cm) 4.6 Width: (cm) 2.4 Depth: (cm) 0.1 Volume: (cm) 0.867 Character of Wound/Ulcer Post Debridement: Improved Post  Procedure Diagnosis Same as Pre-procedure Electronic Signature(s) Signed: 10/07/2022 11:11:47 AM By: Kalman Shan DO Signed: 10/10/2022 4:16:21 PM By: Erenest Blank Entered By: Erenest Blank on 10/07/2022 10:35:55 -------------------------------------------------------------------------------- HPI Details Patient Name: Date of Service: Hailey Courser, DO [name] 10/07/2022 10:15 A M Medical Record Number: [id number] Patient Account Number: [id number] Date of Birth/Sex: Treating RN: 11-11-1945 (77 y.o. F) Primary Care Provider: Lorene Dy Other Clinician: Referring Provider: Treating Provider/Extender: [name] in Treatment: 18 History of Present Illness  HPI Description: Patient presents today for initial evaluation and our clinic as a referral from the [place] system Department of orthopedics for evaluation and treatment of the wound to his his of the left foot at the base of the great toe. Currently the good news is the patient really does not have any significant pain which is excellent. She has been seen by Dr. Linus Salmons at [facility]: infectious disease clinic that is the regional Center for infectious disease. Subsequently the patient was cultured and positive for methicillin sensitive staph aureus. She has been on antibiotics including Cipro prior to surgery as will several days following surgery. She then had clindamycin which was changed to doxycycline. She took this for 10 days. Subsequently the pain had improved and there was no longer any possible draining from the wound according to the notes. The patient sedentary rate as well as C-reactive protein have returned to normal ranges. Currently per Dr. Henreitta Leber assessment there was one dehiscence with no sign of osteomyelitis. He therefore discontinue the antibiotics with the completion of the last three days Of doxycycline. Other than that he just set her for a follow-up as needed. The patient  does not have diabetes and is not a current smoker. At this time her treatment that was recommended by the surgeon was applying a Hydrocolloid dressing. Based on what I'm seeing at this point the patient actually has some Slough noted over the surface of the wound there really does not appear to be evidence of infection though I do think she does require some sharp debridement to clear away the slough and help with appropriate wound healing. She does have areas of granulation buds noted. She may be a candidate for a skin substitute as well. We will see how things do over the next period of time until follow-up. No fevers, chills, nausea, or vomiting noted at this time. [name], [name] ([id number]) [id number] Page 3 of 11 06/13/18 on evaluation today patient actually appears to be doing better in regard to her toe ulcer. She's been using the Prisma on this region and that seems to have done extremely well for her. Fortunately there does not appear to be evidence of infection at this time which is good news. She did see her surgeon they were extremely pleased with the overall appearance of her wound. 06/27/18 on evaluation today patient actually appears to be doing very well in regard to her foot ulcer. She has been tolerating the dressing changes without complication which is great news. She did see her podiatrist they felt like everything is looking very nice as well and will please. 07/05/2018 surgical wound on the left foot. She appears to be doing well. Still surface debrided to remove however in general post debridement the wound bed looks quite healthy it is come down significantly in terms of dimensions using silver collagen. The patient describes pain when her foot is elevated either in bed at night [sometimes keeps her awake] or when is propped up on a foot rest. She basically takes analgesics for this. She does not really describe claudication with activity however  her activity is very limited by interstitial lung disease. Her ABIs initially in this clinic were noncompressible. She is not a diabetic 07/20/2018; surgical wound on the left foot. Wound actually looks quite a bit better than I remember seeing this. She is still describing pain with her leg elevated at night that she does not get when the wound is supine. She does not really describe  claudication but she is very limited by her pulmonary status. We have been using silver collagen. 1/17; the patient has been followed up by orthopedics and discharge. Her arterial studies were actually quite good and should not be contributing to any pain. Slight reduction in ABIs but otherwise normal. Her wound is closed. She saw Dr. Linus Salmons of infectious disease surrounding the surgery. By review of our notes she was not felt to have osteomyelitis. 06/03/2022 Ms. Milan Carpinelli is a 77 year old female with a past medical history of interstitial lung disease on chronic oxygen via nasal cannula, rheumatoid arthritis, chronic diastolic heart failure and OSA that presents to the clinic for a 1 to 21-month history of nonhealing ulcer to the left lower extremity. On 04/20/2022 she was admitted to the hospital for left lower extremity cellulitis. She required 8 days of IV vancomycin and Unasyn followed by a 14-day course of Augmentin and doxycycline. She has been using Silvadene cream to the area. She does not use compression therapy but does own compression stockings. She currently denies signs of infection. 11/27; patient presents for follow-up she is using Medihoney and Hydrofera Blue to the wound beds. She has been using her Tubigrip. She has no issues or complaints today. 12/4; patient presents for follow-up. She continues to use Medihoney and Hydrofera Blue to the wound beds. Several attempts have been made to schedule ABIs with TBI's. We gave patient the number today to call to have these scheduled. She has been using  Tubigrip. She has no issues or complaints today. 12/18; she continues to use Medihoney and Hydrofera Blue in general the surface of the wound is looking somewhat better this week and measurements are slightly smaller. She did have her arterial studies done these were noncompressible bilaterally however TBI on the right of 0.69 on the left 0.62. Waveforms on the right were triphasic on the left biphasic to triphasic. This does not suggest significant arterial disease to make healing wounds at this location difficult. 12/29; patient presents for follow-up. She has been using Medihoney and Hydrofera Blue to the wound bed. She has been using Tubigrip. 1/5; patient presents for follow-up. She is been using Medihoney and Hydrofera Blue to the wound bed along with Tubigrip. The Tubigrip was doubled at last clinic visit. There is been improvement in wound healing. 1/12; patient presents for follow-up. She has been using Medihoney and Hydrofera Blue to the wound bed along with Tubigrip. There continues to be improvement in wound healing. 1/19; patient presents for follow-up. She has been using Medihoney and Hydrofera Blue to the wound bed along with her juxta light compression daily. There is been improvement in wound measurements. Patient has no issues or complaints today. 1/25; patient presents for follow-up. She has been using blast X and collagen to the wound bed under juxta light compression daily. She has no issues or complaints today. There is been improvement in wound healing. 2/1; patient presents for follow-up. She has been using blast X and collagen to the wound bed under juxta light compression daily. She has no issues or complaints today. 2/9; patient presents for follow-up. She has been using Hydrofera Blue with blast X under compression wrap daily. She has been approved for a skin substitute and patient would like to proceed with this. 2/15; patient presents for follow-up. PuraPly was placed  in standard fashion to the large anterior leg wound. She has been using blast X and Hydrofera Blue to the more proximal small wound. She has been using her juxta  light compression daily. She has no issues or complaints today. 2/23; patient presents for follow-up. PuraPly was placed in standard fashion to the large anterior leg wound. The more proximal small wound is almost healed and they have been using blast X and Hydrofera Blue. She has been using her juxta light compression daily to the left lower extremity. There is been improvement in wound healing. 2/29; patient presents for follow-up. PuraPly was placed in standard fashion to the large anterior leg wound. The more proximal wound has healed. She is been using her juxta light compression daily. She has no issues or complaints today. 3/8; patient presents for follow-up. PuraPly has been used to the wound bed weekly patient has done well with this. She has been using her juxta light compression daily. She has no issues or complaints today. 3/15; patient presents for follow-up. We have been using PuraPly to the wound bed weekly. She has been using her juxta light compression daily. She has been doing well with this treatment. Her wound has improved. She has no issues or complaints today. 3/22; patient presents for follow-up. We have been using PuraPly to the wound bed weekly. She has been using her juxta lite compression. Overall more granulation tissue present today. Patient has no issues or complaints today. Electronic Signature(s) Signed: 10/07/2022 11:11:47 AM By: Kalman Shan DO Entered By: Kalman Shan on 10/07/2022 11:03:57 -------------------------------------------------------------------------------- Physical Exam Details Patient Name: Date of Service: Hailey Courser, DO [name] 10/07/2022 10:15 A M Medical Record Number: [id number] Patient Account Number: [id number] Date of Birth/Sex: Treating RN: 06-18-1946 (77 y.o. Clotee Gagnard,  Arvil Persons ([id number]) [id number] Page 4 of 11 Primary Care Provider: Lorene Dy Other Clinician: Referring Provider: Treating Provider/Extender: [name] in Treatment: 18 Constitutional respirations regular, non-labored and within target range for patient.. Cardiovascular 2+ dorsalis pedis/posterior tibialis pulses. Psychiatric pleasant and cooperative. Notes T the anterior aspect of left lower extremity there is an open wound with slough and granulation tissue. No signs of infection. Obvious deformity to the foot o from previous surgeries. Electronic Signature(s) Signed: 10/07/2022 11:11:47 AM By: Kalman Shan DO Entered By: Kalman Shan on 10/07/2022 11:04:20 -------------------------------------------------------------------------------- Physician Orders Details Patient Name: Date of Service: Hailey Courser, DO [name] 10/07/2022 10:15 A M Medical Record Number: [id number] Patient Account Number: [id number] Date of Birth/Sex: Treating RN: August 13, 1945 (77 y.o. F) Primary Care Provider: Lorene Dy Other Clinician: Referring Provider: Treating Provider/Extender: [name] in Treatment: [phone] Verbal / Phone Orders: No Diagnosis Coding Follow-up Appointments ppointment in 1 week. - Dr. Heber Starrucca Rm # 9 Friday 10/14/2022 @ 11:00 Return A ppointment in 2 weeks. - Dr. Celine Ahr on 10/20/22 at 1100am. Return A Anesthetic (In clinic) Topical Lidocaine 5% applied to wound bed Cellular or Tissue Based Products Wound #5 Left,Distal,Anterior Lower Leg Cellular or Tissue Based Product Type: - 08/18/2022 insurance ran organogenesis- 100% covered. 08/26/2022 #1 Puraply AM applied. 09/01/2022 #2 Puraply AM applied. 09/09/2022 #3 Puraply AM applied to both wounds. 09/15/2022 #4 Puraply AM applied to wound. 09/23/2022 #5 Puraply AM applied to wound. 09/30/22 # 6 PUraply AM applied to wound. 10/07/22 #7 Puraply  AM applied to wound. Cellular or Tissue Based Product applied to wound bed, secured with steri-strips, cover with Adaptic or Mepitel. (DO NOT REMOVE). Bathing/ Shower/ Hygiene May shower with protection but do not get wound dressing(s) wet. Protect dressing(s) with water repellant cover (for example, large plastic bag) or a cast cover and may then take  shower. Edema Control - Lymphedema / SCD / Other Elevate legs to the level of the heart or above for 30 minutes daily and/or when sitting for 3-4 times a day throughout the day. Avoid standing for long periods of time. Exercise regularly Compression stocking or Garment 20-30 mm/Hg pressure to: - start wearing the juxtalite HD apply in the morning and remove at night left leg. Non Wound Condition Protect area with: - buttock area with AandD ointment daily Wound Treatment Wound #5 - Lower Leg Wound Laterality: Left, Anterior, Distal Cleanser: Wound Cleanser (Generic) 1 x Per Week/30 Days Discharge Instructions: Cleanse the wound with wound cleanser prior to applying a clean dressing using gauze sponges, not tissue or cotton balls. Prim Dressing: Puraply #7 1 x Per Week/30 Days ary Discharge Instructions: applied by provider. ABRINA, GOOTEE ([id number]) [id number] Page 5 of 11 Prim Dressing: Adaptic and steri-strips 1 x Per Week/30 Days ary Discharge Instructions: Used to secure Puraply AM. Leave in place. Do not remove. Secondary Dressing: Zetuvit Plus Silicone Border Dressing 5x5 (in/in) 1 x Per Week/30 Days Discharge Instructions: Apply silicone border over primary dressing as directed. Electronic Signature(s) Signed: 10/07/2022 11:11:47 AM By: Kalman Shan DO Entered By: Kalman Shan on 10/07/2022 11:04:29 -------------------------------------------------------------------------------- Problem List Details Patient Name: Date of Service: Hailey Courser, DO [name] 10/07/2022 10:15 A M Medical Record Number:  [id number] Patient Account Number: [id number] Date of Birth/Sex: Treating RN: 1945-11-17 (77 y.o. F) Primary Care Provider: Lorene Dy Other Clinician: Referring Provider: Treating Provider/Extender: [name] in Treatment: 18 Active Problems ICD-10 Encounter Code Description Active Date MDM Diagnosis L97.828 Non-pressure chronic ulcer of other part of left lower leg with other specified 06/03/2022 No Yes severity I87.312 Chronic venous hypertension (idiopathic) with ulcer of left lower extremity 06/03/2022 No Yes J84.9 Interstitial pulmonary disease, unspecified 06/03/2022 No Yes M06.9 Rheumatoid arthritis, unspecified 06/03/2022 No Yes Inactive Problems Resolved Problems Electronic Signature(s) Signed: 10/07/2022 11:11:47 AM By: Kalman Shan DO Entered By: Kalman Shan on 10/07/2022 11:01:36 -------------------------------------------------------------------------------- Progress Note Details Patient Name: Date of Service: Hailey Courser, DO [name] 10/07/2022 10:15 A M Medical Record Number: [id number] Patient Account Number: [id number] Date of Birth/Sex: Treating RN: 1945-09-06 (77 y.o. F) Primary Care Provider: Lorene Dy Other Clinician: Referring Provider: Treating Provider/Extender: [name] in Treatment: 18 Subjective Chief Complaint Information obtained from Patient 06/03/2022; Left lower extremity wounds SHARMON, HUNNEWELL ([id number]) [id number] Page 6 of 11 History of Present Illness (HPI) Patient presents today for initial evaluation and our clinic as a referral from the [place] system Department of orthopedics for evaluation and treatment of the wound to his his of the left foot at the base of the great toe. Currently the good news is the patient really does not have any significant pain which is excellent. She has been seen by Dr. Linus Salmons at [facility]:  infectious disease clinic that is the regional Center for infectious disease. Subsequently the patient was cultured and positive for methicillin sensitive staph aureus. She has been on antibiotics including Cipro prior to surgery as will several days following surgery. She then had clindamycin which was changed to doxycycline. She took this for 10 days. Subsequently the pain had improved and there was no longer any possible draining from the wound according to the notes. The patient sedentary rate as well as C-reactive protein have returned to normal ranges. Currently per Dr. Henreitta Leber assessment there was one dehiscence with no sign of osteomyelitis.  He therefore discontinue the antibiotics with the completion of the last three days Of doxycycline. Other than that he just set her for a follow-up as needed. The patient does not have diabetes and is not a current smoker. At this time her treatment that was recommended by the surgeon was applying a Hydrocolloid dressing. Based on what I'm seeing at this point the patient actually has some Slough noted over the surface of the wound there really does not appear to be evidence of infection though I do think she does require some sharp debridement to clear away the slough and help with appropriate wound healing. She does have areas of granulation buds noted. She may be a candidate for a skin substitute as well. We will see how things do over the next period of time until follow-up. No fevers, chills, nausea, or vomiting noted at this time. 06/13/18 on evaluation today patient actually appears to be doing better in regard to her toe ulcer. She's been using the Prisma on this region and that seems to have done extremely well for her. Fortunately there does not appear to be evidence of infection at this time which is good news. She did see her surgeon they were extremely pleased with the overall appearance of her wound. 06/27/18 on evaluation today patient  actually appears to be doing very well in regard to her foot ulcer. She has been tolerating the dressing changes without complication which is great news. She did see her podiatrist they felt like everything is looking very nice as well and will please. 07/05/2018 surgical wound on the left foot. She appears to be doing well. Still surface debrided to remove however in general post debridement the wound bed looks quite healthy it is come down significantly in terms of dimensions using silver collagen. The patient describes pain when her foot is elevated either in bed at night [sometimes keeps her awake] or when is propped up on a foot rest. She basically takes analgesics for this. She does not really describe claudication with activity however her activity is very limited by interstitial lung disease. Her ABIs initially in this clinic were noncompressible. She is not a diabetic 07/20/2018; surgical wound on the left foot. Wound actually looks quite a bit better than I remember seeing this. She is still describing pain with her leg elevated at night that she does not get when the wound is supine. She does not really describe claudication but she is very limited by her pulmonary status. We have been using silver collagen. 1/17; the patient has been followed up by orthopedics and discharge. Her arterial studies were actually quite good and should not be contributing to any pain. Slight reduction in ABIs but otherwise normal. Her wound is closed. She saw Dr. Linus Salmons of infectious disease surrounding the surgery. By review of our notes she was not felt to have osteomyelitis. 06/03/2022 Ms. [name] is a 77 year old female with a past medical history of interstitial lung disease on chronic oxygen via nasal cannula, rheumatoid arthritis, chronic diastolic heart failure and OSA that presents to the clinic for a 1 to 45-month history of nonhealing ulcer to the left lower extremity. On 04/20/2022 she was  admitted to the hospital for left lower extremity cellulitis. She required 8 days of IV vancomycin and Unasyn followed by a 14-day course of Augmentin and doxycycline. She has been using Silvadene cream to the area. She does not use compression therapy but does own compression stockings. She currently denies signs of  infection. 11/27; patient presents for follow-up she is using Medihoney and Hydrofera Blue to the wound beds. She has been using her Tubigrip. She has no issues or complaints today. 12/4; patient presents for follow-up. She continues to use Medihoney and Hydrofera Blue to the wound beds. Several attempts have been made to schedule ABIs with TBI's. We gave patient the number today to call to have these scheduled. She has been using Tubigrip. She has no issues or complaints today. 12/18; she continues to use Medihoney and Hydrofera Blue in general the surface of the wound is looking somewhat better this week and measurements are slightly smaller. She did have her arterial studies done these were noncompressible bilaterally however TBI on the right of 0.69 on the left 0.62. Waveforms on the right were triphasic on the left biphasic to triphasic. This does not suggest significant arterial disease to make healing wounds at this location difficult. 12/29; patient presents for follow-up. She has been using Medihoney and Hydrofera Blue to the wound bed. She has been using Tubigrip. 1/5; patient presents for follow-up. She is been using Medihoney and Hydrofera Blue to the wound bed along with Tubigrip. The Tubigrip was doubled at last clinic visit. There is been improvement in wound healing. 1/12; patient presents for follow-up. She has been using Medihoney and Hydrofera Blue to the wound bed along with Tubigrip. There continues to be improvement in wound healing. 1/19; patient presents for follow-up. She has been using Medihoney and Hydrofera Blue to the wound bed along with her juxta light  compression daily. There is been improvement in wound measurements. Patient has no issues or complaints today. 1/25; patient presents for follow-up. She has been using blast X and collagen to the wound bed under juxta light compression daily. She has no issues or complaints today. There is been improvement in wound healing. 2/1; patient presents for follow-up. She has been using blast X and collagen to the wound bed under juxta light compression daily. She has no issues or complaints today. 2/9; patient presents for follow-up. She has been using Hydrofera Blue with blast X under compression wrap daily. She has been approved for a skin substitute and patient would like to proceed with this. 2/15; patient presents for follow-up. PuraPly was placed in standard fashion to the large anterior leg wound. She has been using blast X and Hydrofera Blue to the more proximal small wound. She has been using her juxta light compression daily. She has no issues or complaints today. 2/23; patient presents for follow-up. PuraPly was placed in standard fashion to the large anterior leg wound. The more proximal small wound is almost healed and they have been using blast X and Hydrofera Blue. She has been using her juxta light compression daily to the left lower extremity. There is been improvement in wound healing. 2/29; patient presents for follow-up. PuraPly was placed in standard fashion to the large anterior leg wound. The more proximal wound has healed. She is been using her juxta light compression daily. She has no issues or complaints today. 3/8; patient presents for follow-up. PuraPly has been used to the wound bed weekly patient has done well with this. She has been using her juxta light compression daily. She has no issues or complaints today. 3/15; patient presents for follow-up. We have been using PuraPly to the wound bed weekly. She has been using her juxta light compression daily. She has been doing  well with this treatment. Her wound has improved. She has no issues  or complaints today. 3/22; patient presents for follow-up. We have been using PuraPly to the wound bed weekly. She has been using her juxta lite compression. Overall more granulation tissue present today. Patient has no issues or complaints today. ESSIEMAE, MATER ([id number]) [id number] Page 7 of 11 Patient History Information obtained from Patient. Family History Cancer - Maternal Grandparents, Diabetes - Siblings, Heart Disease - Maternal Grandparents,Paternal Grandparents,Mother,Father,Siblings, Hypertension - Mother,Father,Siblings, Stroke - Paternal Grandparents, No family history of Hereditary Spherocytosis, Kidney Disease, Lung Disease, Seizures, Thyroid Problems, Tuberculosis. Social History Never smoker, Marital Status - Married, Alcohol Use - Moderate, Drug Use - No History, Caffeine Use - Moderate. Medical History Eyes Denies history of Cataracts, Optic Neuritis Ear/Nose/Mouth/Throat Denies history of Chronic sinus problems/congestion Hematologic/Lymphatic Denies history of Anemia, Hemophilia, Human Immunodeficiency Virus, Lymphedema, Sickle Cell Disease Respiratory Patient has history of Asthma, Sleep Apnea Denies history of Aspiration, Chronic Obstructive Pulmonary Disease (COPD), Pneumothorax, Tuberculosis Cardiovascular Patient has history of Hypertension Denies history of Angina, Arrhythmia, Congestive Heart Failure, Coronary Artery Disease, Deep Vein Thrombosis, Hypotension, Myocardial Infarction, Peripheral Arterial Disease, Peripheral Venous Disease, Phlebitis, Vasculitis Gastrointestinal Patient has history of Colitis Denies history of Cirrhosis , Crohnoos, Hepatitis A, Hepatitis B, Hepatitis C Endocrine Denies history of Type I Diabetes, Type II Diabetes Immunological Denies history of Lupus Erythematosus, Raynaudoos, Scleroderma Integumentary (Skin) Denies history  of History of Burn Musculoskeletal Patient has history of Rheumatoid Arthritis, Osteoarthritis Denies history of Gout, Osteomyelitis Neurologic Denies history of Dementia, Neuropathy, Quadriplegia, Paraplegia, Seizure Disorder Oncologic Denies history of Received Chemotherapy, Received Radiation Psychiatric Denies history of Anorexia/bulimia, Confinement Anxiety Hospitalization/Surgery History - surgery toe. - right hip replacement 12/2018. - heart cath 12/17/2021. Medical A Surgical History Notes nd Respiratory 4L [place] 02 pulmonary fibrosis Objective Constitutional respirations regular, non-labored and within target range for patient.. Vitals Time Taken: 10:20 AM, Height: 61 in, Weight: 186 lbs, BMI: 35.1, Temperature: 98.1 F, Pulse: 85 bpm, Respiratory Rate: 18 breaths/min, Blood Pressure: 127/78 mmHg. Cardiovascular 2+ dorsalis pedis/posterior tibialis pulses. Psychiatric pleasant and cooperative. General Notes: T the anterior aspect of left lower extremity there is an open wound with slough and granulation tissue. No signs of infection. Obvious o deformity to the foot from previous surgeries. Integumentary (Hair, Skin) Wound #5 status is Open. Original cause of wound was Gradually Appeared. The date acquired was: 04/19/2022. The wound has been in treatment 18 weeks. The wound is located on the [facility] Lower Leg. The wound measures 4.6cm length x 2.4cm width x 0.1cm depth; 8.671cm^2 area and 0.867cm^3 volume. There is Fat Layer (Subcutaneous Tissue) exposed. There is no tunneling or undermining noted. There is a medium amount of serosanguineous drainage noted. The wound margin is distinct with the outline attached to the wound base. There is large (67-100%) pink, pale granulation within the wound bed. There is a small (1-33%) amount of necrotic tissue within the wound bed including Adherent Slough. The periwound skin appearance did not exhibit: Callus, Crepitus,  Excoriation, Induration, Rash, Scarring, Dry/Scaly, Maceration, Atrophie Blanche, Cyanosis, Ecchymosis, Hemosiderin Staining, Mottled, Pallor, Rubor, Erythema. Periwound temperature was noted as No Abnormality. 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 I debrided nonviable tissue. PuraPly was placed in standard fashion. I recommended continuing compression wrap daily. Follow-up in 1 week. Procedures Wound #5  Pre-procedure diagnosis of Wound #5 is a Cellulitis located on the Left,Distal,Anterior Lower Leg . There was a Selective/Open Wound Non-Viable Tissue Debridement with a total area of 11.04 sq cm performed by Kalman Shan, DO. With the following instrument(s): Curette to remove Non-Viable tissue/material. Material removed includes Slough after achieving pain control using Lidocaine 4% T opical Solution. No specimens were taken. A time out was conducted at 10:30, prior to the start of the procedure. A Minimum amount of bleeding was controlled with Pressure. The procedure was tolerated well with a pain level of 0 throughout and a pain level of 0 following the procedure. Post Debridement Measurements: 4.6cm length x 2.4cm width x 0.1cm depth; 0.867cm^3 volume. Character of Wound/Ulcer Post Debridement is improved. Post procedure Diagnosis Wound #5: Same as Pre-Procedure Pre-procedure diagnosis of Wound #5 is a Cellulitis located on the Left,Distal,Anterior Lower Leg. A skin graft procedure using a bioengineered skin substitute/cellular or tissue based product was performed by Kalman Shan, DO with the following instrument(s): Forceps and  Scissors. Puraply AM was applied and secured with Steri-Strips and adaptic. 12 sq cm of product was utilized and 0 sq cm was wasted. Post Application, no dressing was applied. A Time Out was conducted at 10:35, prior to the start of the procedure. The procedure was tolerated well with a pain level of 0 throughout and a pain level of 0 following the procedure. Post procedure Diagnosis Wound #5: Same as Pre-Procedure . Plan Follow-up Appointments: Return Appointment in 1 week. - Dr. [place] Rm # 9 Friday 10/14/2022 @ 11:00 Return Appointment in 2 weeks. - Dr. Celine Ahr on 10/20/22 at 1100am. Anesthetic: (In clinic) Topical Lidocaine 5% applied to wound bed Cellular or Tissue Based Products: Wound #5 Left,Distal,Anterior Lower Leg: Cellular or Tissue Based Product Type: - 08/18/2022 insurance ran organogenesis- 100% covered. 08/26/2022 #1 Puraply AM applied. 09/01/2022 #2 Puraply AM applied. 09/09/2022 #3 Puraply AM applied to both wounds. 09/15/2022 #4 Puraply AM applied to wound. 09/23/2022 #5 Puraply AM applied to wound. 09/30/22 # 6 PUraply AM applied to wound. 10/07/22 #7 Puraply AM applied to wound. Cellular or Tissue Based Product applied to wound bed, secured with steri-strips, cover with Adaptic or Mepitel. (DO NOT REMOVE). Bathing/ Shower/ Hygiene: May shower with protection but do not get wound dressing(s) wet. Protect dressing(s) with water repellant cover (for example, large plastic bag) or a cast cover and may then take shower. Edema Control - Lymphedema / SCD / Other: Elevate legs to the level of the heart or above for 30 minutes daily and/or when sitting for 3-4 times a day throughout the day. Avoid standing for long periods of time. Exercise regularly Compression stocking or Garment 20-30 mm/Hg pressure to: - start wearing the juxtalite HD apply in the morning and remove at night left leg. Non Wound Condition: Protect area with: - buttock area with AandD ointment daily WOUND #5: - Lower Leg  Wound Laterality: Left, Anterior, Distal Cleanser: Wound Cleanser (Generic) 1 x Per Week/30 Days Discharge Instructions: Cleanse the wound with wound cleanser prior to applying a clean dressing using gauze sponges, not tissue or cotton balls. Prim Dressing: Puraply #7 1 x Per Week/30 Days ary Discharge Instructions: applied by provider. Prim Dressing: Adaptic and steri-strips 1 x Per Week/30 Days ary Discharge Instructions: Used to secure Puraply AM. Leave in place. Do not remove. Secondary Dressing: Zetuvit Plus Silicone Border Dressing 5x5 (in/in) 1 x Per Week/30 Days Discharge Instructions: Apply silicone border over primary dressing as directed. 1. In office  sharp debridement 2. PuraPly placed in standard fashion 3. Compression daily 4. Follow-up in 1 week Electronic Signature(s) Signed: 10/07/2022 11:11:47 AM By: Delphia Grates, Arvil Persons ([id number]) AM By: Kalman Shan DO [id number] Page 9 of 11 Signed: 10/07/2022 11:11:47 Entered By: Kalman Shan on 10/07/2022 11:05:49 -------------------------------------------------------------------------------- HxROS Details Patient Name: Date of Service: [name], DO [name] 10/07/2022 10:15 A M Medical Record Number: [id number] Patient Account Number: [id number] Date of Birth/Sex: Treating RN: [birth year] (77 y.o. F) Primary Care Provider: Lorene Dy Other Clinician: Referring Provider: Treating Provider/Extender: [name] in Treatment: 18 Information Obtained From Patient Eyes Medical History: Negative for: Cataracts; Optic Neuritis Ear/Nose/Mouth/Throat Medical History: Negative for: Chronic sinus problems/congestion Hematologic/Lymphatic Medical History: Negative for: Anemia; Hemophilia; Human Immunodeficiency Virus; Lymphedema; Sickle Cell Disease Respiratory Medical History: Positive for: Asthma; Sleep Apnea Negative for: Aspiration; Chronic  Obstructive Pulmonary Disease (COPD); Pneumothorax; Tuberculosis Past Medical History Notes: 4L [place] 02 pulmonary fibrosis Cardiovascular Medical History: Positive for: Hypertension Negative for: Angina; Arrhythmia; Congestive Heart Failure; Coronary Artery Disease; Deep Vein Thrombosis; Hypotension; Myocardial Infarction; Peripheral Arterial Disease; Peripheral Venous Disease; Phlebitis; Vasculitis Gastrointestinal Medical History: Positive for: Colitis Negative for: Cirrhosis ; Crohns; Hepatitis A; Hepatitis B; Hepatitis C Endocrine Medical History: Negative for: Type I Diabetes; Type II Diabetes Immunological Medical History: Negative for: Lupus Erythematosus; Raynauds; Scleroderma Integumentary (Skin) Medical History: Negative for: History of Burn Musculoskeletal Medical History: Positive for: Rheumatoid Arthritis; Osteoarthritis Negative for: Gout; Osteomyelitis Neurologic Medical History: Negative for: Dementia; Neuropathy; Quadriplegia; Paraplegia; Seizure Disorder Hailey, [name] ([id number]) [id number] Page 10 of 11 Oncologic Medical History: Negative for: Received Chemotherapy; Received Radiation Psychiatric Medical History: Negative for: Anorexia/bulimia; Confinement Anxiety Immunizations Pneumococcal Vaccine: Received Pneumococcal Vaccination: Yes Received Pneumococcal Vaccination On or After 60th Birthday: Yes Implantable Devices No devices added Hospitalization / Surgery History Type of Hospitalization/Surgery surgery toe right hip replacement 12/2018 heart cath 12/17/2021 Family and Social History Cancer: Yes - Maternal Grandparents; Diabetes: Yes - Siblings; Heart Disease: Yes - Maternal Grandparents,Paternal Grandparents,Mother,Father,Siblings; Hereditary Spherocytosis: No; Hypertension: Yes - Mother,Father,Siblings; Kidney Disease: No; Lung Disease: No; Seizures: No; Stroke: Yes - Paternal Grandparents; Thyroid Problems: No;  Tuberculosis: No; Never smoker; Marital Status - Married; Alcohol Use: Moderate; Drug Use: No History; Caffeine Use: Moderate; Financial Concerns: No; Food, Clothing or Shelter Needs: No; Support System Lacking: No; Transportation Concerns: No Electronic Signature(s) Signed: 10/07/2022 11:11:47 AM By: Kalman Shan DO Entered By: Kalman Shan on 10/07/2022 11:04:03 -------------------------------------------------------------------------------- SuperBill Details Patient Name: Date of Service: [name], DO [name] 10/07/2022 Medical Record Number: [id number] Patient Account Number: [id number] Date of Birth/Sex: Treating RN: [birth year] (77 y.o. F) Primary Care Provider: Lorene Dy Other Clinician: Referring Provider: Treating Provider/Extender: [name] in Treatment: 18 Diagnosis Coding ICD-10 Codes Code Description [phone] Non-pressure chronic ulcer of other part of left lower leg with other specified severity I87.312 Chronic venous hypertension (idiopathic) with ulcer of left lower extremity J84.9 Interstitial pulmonary disease, unspecified M06.9 Rheumatoid arthritis, unspecified Facility Procedures : CPT4 Code: [id number] Description: [id number] PuraPly AM 3X4 (12sq. cm) enter 12qty Modifier: Quantity: 12 : CPT4 Code: [id number] Description: [id number] - SKIN SUB GRAFT TRNK/ARM/LEG ICD-10 Diagnosis Description L97.828 Non-pressure chronic ulcer of other part of left lower leg with other specified s I87.312 Chronic venous hypertension (idiopathic) with ulcer of left lower extremity Modifier: everity Quantity: 1 Physician Procedures : CPT4 Code Description Modifier [id number] - WC PHYS SKIN SUB GRAFT TRNK/ARM/LEG ICD-10 Diagnosis Description Uhrich, [place]  S ([id number]) [id number] Non-pressure chronic ulcer of other part of left lower leg with other  specified severity I87.312 Chronic venous hypertension (idiopathic) with  ulcer of left lower extremity Quantity: 1 27.pdf Page 11 of 11 Electronic Signature(s) Signed: 10/07/2022 11:11:47 AM By: Kalman Shan DO Entered By: Kalman Shan on 10/07/2022 11:06:48

## 2022-10-14 ENCOUNTER — Encounter (HOSPITAL_BASED_OUTPATIENT_CLINIC_OR_DEPARTMENT_OTHER): Payer: Medicare Other | Admitting: Internal Medicine

## 2022-10-14 DIAGNOSIS — L97828 Non-pressure chronic ulcer of other part of left lower leg with other specified severity: Secondary | ICD-10-CM

## 2022-10-14 DIAGNOSIS — I87312 Chronic venous hypertension (idiopathic) with ulcer of left lower extremity: Secondary | ICD-10-CM

## 2022-10-14 DIAGNOSIS — L97822 Non-pressure chronic ulcer of other part of left lower leg with fat layer exposed: Secondary | ICD-10-CM | POA: Diagnosis not present

## 2022-10-15 NOTE — Progress Notes (Signed)
JAMAIRA, LYERLY ([id number]) [id number] Page 1 of 11 Visit Report for 10/14/2022 Chief Complaint Document Details Patient Name: Date of Service: Hailey Courser, Hailey Hailey THY S. 10/14/2022 11:00 A M Medical Record Number: [id number] Patient Account Number: [id number] Date of Birth/Sex: Treating RN: 04/21/1946 (77 y.o. F) Primary Care Provider: Lorene Dy Other Clinician: Referring Provider: Treating Provider/Extender: [name] in [id number] Information Obtained from: Patient Chief Complaint 06/03/2022; Left lower extremity wounds Electronic Signature(s) Signed: 10/14/2022 12:14:37 PM By: Kalman Shan Hailey Entered By: Kalman Shan on 10/14/2022 12:11:53 -------------------------------------------------------------------------------- Cellular or Tissue Based Product Details Patient Name: Date of Service: Hailey Courser, Hailey Hailey THY S. 10/14/2022 11:00 A M Medical Record Number: [id number] Patient Account Number: [id number] Date of Birth/Sex: Treating RN: [birth year] (77 y.o. [name], [name] Primary Care Provider: Lorene Dy Other Clinician: Referring Provider: Treating Provider/Extender: [name] in Treatment: 19 Cellular or Tissue Based Product Type Wound #5 Left,Distal,Anterior Lower Leg Applied to: Performed By: Physician Kalman Shan, Hailey Cellular or Tissue Based Product Type: Puraply AM Level of Consciousness (Pre-procedure): Awake and Alert Pre-procedure Verification/Time Out Yes - 11:56 Taken: Location: trunk / arms / legs Wound Size (sq cm): 8.82 Product Size (sq cm): 12 Waste Size (sq cm): 0 Amount of Product Applied (sq cm): 12 Instrument Used: Forceps, Scissors Lot #: [id number] Order #: [id number] Expiration Date: 08/15/2024 Fenestrated: No Reconstituted: Yes Solution Type: normal saline Solution Amount: 32mL Lot #: [id number] Solution Expiration Date: 12/15/2024 Secured: Yes Secured  With: Steri-Strips, adaptic Dressing Applied: No Procedural Pain: 0 Post Procedural Pain: 0 Response to Treatment: Procedure was tolerated well Level of Consciousness (Post- Awake and Alert procedure): Post Procedure Diagnosis Same as Pre-procedure Electronic Signature(s) Signed: 10/14/2022 12:14:37 PM By: Kalman Shan Hailey Signed: 10/14/2022 4:18:03 PM By: Deon Pilling RN, BSN Entered By: Deon Pilling on 10/14/2022 11:58:52 DeWitt, [place] ([id number]) [id number] Page 2 of 11 -------------------------------------------------------------------------------- Debridement Details Patient Name: Date of Service: Hailey Courser, Hailey Hailey THY S. 10/14/2022 11:00 A M Medical Record Number: [id number] Patient Account Number: [id number] Date of Birth/Sex: Treating RN: Dec 06, 1945 (77 y.o. [name], [name] Primary Care Provider: Lorene Dy Other Clinician: Referring Provider: Treating Provider/Extender: [name] in Treatment: 19 Debridement Performed for Assessment: Wound #5 Left,Distal,Anterior Lower Leg Performed By: Physician Kalman Shan, Hailey Debridement Type: Debridement Level of Consciousness (Pre-procedure): Awake and Alert Pre-procedure Verification/Time Out Yes - 11:50 Taken: Start Time: 11:51 Pain Control: Lidocaine 5% topical ointment T Area Debrided (L x W): otal 4.2 (cm) x 2.1 (cm) = 8.82 (cm) Tissue and other material debrided: Viable, Non-Viable, Slough, Subcutaneous, Skin: Dermis , Skin: Epidermis, Slough Level: Skin/Subcutaneous Tissue Debridement Description: Excisional Instrument: Curette Bleeding: Minimum Hemostasis Achieved: Pressure End Time: 11:55 Procedural Pain: 0 Post Procedural Pain: 0 Response to Treatment: Procedure was tolerated well Level of Consciousness (Post- Awake and Alert procedure): Post Debridement Measurements of Total Wound Length: (cm) 4.2 Width: (cm) 2.1 Depth: (cm) 0.1 Volume:  (cm) 0.693 Character of Wound/Ulcer Post Debridement: Improved Post Procedure Diagnosis Same as Pre-procedure Electronic Signature(s) Signed: 10/14/2022 12:14:37 PM By: Kalman Shan Hailey Signed: 10/14/2022 4:18:03 PM By: Deon Pilling RN, BSN Entered By: Deon Pilling on 10/14/2022 11:56:18 -------------------------------------------------------------------------------- HPI Details Patient Name: Date of Service: Hailey Courser, Hailey Hailey THY S. 10/14/2022 11:00 A M Medical Record Number: [id number] Patient Account Number: [id number] Date of Birth/Sex: Treating RN: 1946/01/27 (77 y.o. F) Primary Care Provider: Lorene Dy Other Clinician: Referring Provider: Treating  Provider/Extender: [name] in Treatment: 19 History of Present Illness HPI Description: Patient presents today for initial evaluation and our clinic as a referral from the [facility] health [facility] Department of orthopedics for evaluation and treatment of the wound to his his of the left foot at the base of the great toe. Currently the good news is the patient really does not have any significant pain which is excellent. She has been seen by Dr. Linus Salmons at [facility]: infectious disease clinic that is the regional Center for infectious disease. Subsequently the patient was cultured and positive for methicillin sensitive staph aureus. She has been on antibiotics including Cipro prior to surgery as will several days following surgery. She then had clindamycin which was changed to doxycycline. She took this for 10 days. Subsequently the pain had improved and there was no longer any possible draining from the wound according to the notes. The patient sedentary rate as well as C-reactive protein have returned to normal ranges. Currently per Dr. Henreitta Leber assessment there was one dehiscence with no sign of osteomyelitis. He therefore discontinue the antibiotics with the completion of the last three days Of  doxycycline. Other than that he just set her for a follow-up as needed. The patient does not have diabetes and is not a current smoker. At this time her treatment that was recommended by the surgeon was applying a Hydrocolloid dressing. Based on what I'm seeing at this point the patient actually has some Slough noted over the surface of the wound there really does not appear to be evidence of infection though I Hailey think she does require some sharp debridement to clear away the slough and help with appropriate wound healing. She does have areas of granulation buds noted. She may be a candidate for a skin substitute as well. We will see how things Hailey over the next period of time until follow-up. No fevers, chills, nausea, or vomiting noted at this time. ULISSA, CLUNIE ([id number]) [id number] Page 3 of 11 06/13/18 on evaluation today patient actually appears to be doing better in regard to her toe ulcer. She's been using the Prisma on this region and that seems to have done extremely well for her. Fortunately there does not appear to be evidence of infection at this time which is good news. She did see her surgeon they were extremely pleased with the overall appearance of her wound. 06/27/18 on evaluation today patient actually appears to be doing very well in regard to her foot ulcer. She has been tolerating the dressing changes without complication which is great news. She did see her podiatrist they felt like everything is looking very nice as well and will please. 07/05/2018 surgical wound on the left foot. She appears to be doing well. Still surface debrided to remove however in general post debridement the wound bed looks quite healthy it is come down significantly in terms of dimensions using silver collagen. The patient describes pain when her foot is elevated either in bed at night [sometimes keeps her awake] or when is propped up on a foot rest. She basically takes  analgesics for this. She does not really describe claudication with activity however her activity is very limited by interstitial lung disease. Her ABIs initially in this clinic were noncompressible. She is not a diabetic 07/20/2018; surgical wound on the left foot. Wound actually looks quite a bit better than I remember seeing this. She is still describing pain with her leg elevated at night that she  does not get when the wound is supine. She does not really describe claudication but she is very limited by her pulmonary status. We have been using silver collagen. 1/17; the patient has been followed up by orthopedics and discharge. Her arterial studies were actually quite good and should not be contributing to any pain. Slight reduction in ABIs but otherwise normal. Her wound is closed. She saw Dr. Linus Salmons of infectious disease surrounding the surgery. By review of our notes she was not felt to have osteomyelitis. 06/03/2022 [name] is a 77 year old female with a past medical history of interstitial lung disease on chronic oxygen via nasal cannula, rheumatoid arthritis, chronic diastolic heart failure and OSA that presents to the clinic for a 1 to 51-month history of nonhealing ulcer to the left lower extremity. On 04/20/2022 she was admitted to the hospital for left lower extremity cellulitis. She required 8 days of IV vancomycin and Unasyn followed by a 14-day course of Augmentin and doxycycline. She has been using Silvadene cream to the area. She does not use compression therapy but does own compression stockings. She currently denies signs of infection. 11/27; patient presents for follow-up she is using Medihoney and Hydrofera Blue to the wound beds. She has been using her Tubigrip. She has no issues or complaints today. 12/4; patient presents for follow-up. She continues to use Medihoney and Hydrofera Blue to the wound beds. Several attempts have been made to schedule ABIs with TBI's. We  gave patient the number today to call to have these scheduled. She has been using Tubigrip. She has no issues or complaints today. 12/18; she continues to use Medihoney and Hydrofera Blue in general the surface of the wound is looking somewhat better this week and measurements are slightly smaller. She did have her arterial studies done these were noncompressible bilaterally however TBI on the right of 0.69 on the left 0.62. Waveforms on the right were triphasic on the left biphasic to triphasic. This does not suggest significant arterial disease to make healing wounds at this location difficult. 12/29; patient presents for follow-up. She has been using Medihoney and Hydrofera Blue to the wound bed. She has been using Tubigrip. 1/5; patient presents for follow-up. She is been using Medihoney and Hydrofera Blue to the wound bed along with Tubigrip. The Tubigrip was doubled at last clinic visit. There is been improvement in wound healing. 1/12; patient presents for follow-up. She has been using Medihoney and Hydrofera Blue to the wound bed along with Tubigrip. There continues to be improvement in wound healing. 1/19; patient presents for follow-up. She has been using Medihoney and Hydrofera Blue to the wound bed along with her juxta light compression daily. There is been improvement in wound measurements. Patient has no issues or complaints today. 1/25; patient presents for follow-up. She has been using blast X and collagen to the wound bed under juxta light compression daily. She has no issues or complaints today. There is been improvement in wound healing. 2/1; patient presents for follow-up. She has been using blast X and collagen to the wound bed under juxta light compression daily. She has no issues or complaints today. 2/9; patient presents for follow-up. She has been using Hydrofera Blue with blast X under compression wrap daily. She has been approved for a skin substitute and patient would like  to proceed with this. 2/15; patient presents for follow-up. PuraPly was placed in standard fashion to the large anterior leg wound. She has been using blast X and Hydrofera  Blue to the more proximal small wound. She has been using her juxta light compression daily. She has no issues or complaints today. 2/23; patient presents for follow-up. PuraPly was placed in standard fashion to the large anterior leg wound. The more proximal small wound is almost healed and they have been using blast X and Hydrofera Blue. She has been using her juxta light compression daily to the left lower extremity. There is been improvement in wound healing. 2/29; patient presents for follow-up. PuraPly was placed in standard fashion to the large anterior leg wound. The more proximal wound has healed. She is been using her juxta light compression daily. She has no issues or complaints today. 3/8; patient presents for follow-up. PuraPly has been used to the wound bed weekly patient has done well with this. She has been using her juxta light compression daily. She has no issues or complaints today. 3/15; patient presents for follow-up. We have been using PuraPly to the wound bed weekly. She has been using her juxta light compression daily. She has been doing well with this treatment. Her wound has improved. She has no issues or complaints today. 3/22; patient presents for follow-up. We have been using PuraPly to the wound bed weekly. She has been using her juxta lite compression. Overall more granulation tissue present today. Patient has no issues or complaints today. 3/29; patient presents for follow-up. We have been using PuraPly to the wound bed weekly. She has been using her juxta lite compression daily. The wound is smaller. Electronic Signature(s) Signed: 10/14/2022 12:14:37 PM By: Kalman Shan Hailey Entered By: Kalman Shan on 10/14/2022  12:12:15 -------------------------------------------------------------------------------- Physical Exam Details Patient Name: Date of Service: Hailey Courser, Hailey Hailey THY S. 10/14/2022 11:00 A MARCELLINE, CORTES ([id number]) [id number] Page 4 of 11 Medical Record Number: [id number] Patient Account Number: [id number] Date of Birth/Sex: Treating RN: 1945/10/19 (77 y.o. F) Primary Care Provider: Lorene Dy Other Clinician: Referring Provider: Treating Provider/Extender: [name] in Treatment: 19 Constitutional respirations regular, non-labored and within target range for patient.. Cardiovascular 2+ dorsalis pedis/posterior tibialis pulses. Psychiatric pleasant and cooperative. Notes T the anterior aspect of left lower extremity there is an open wound with slough and granulation tissue. No signs of infection. Obvious deformity to the foot o from previous surgeries. Electronic Signature(s) Signed: 10/14/2022 12:14:37 PM By: Kalman Shan Hailey Entered By: Kalman Shan on 10/14/2022 12:12:45 -------------------------------------------------------------------------------- Physician Orders Details Patient Name: Date of Service: Hailey Courser, Hailey Hailey THY S. 10/14/2022 11:00 A M Medical Record Number: [id number] Patient Account Number: [id number] Date of Birth/Sex: Treating RN: March 08, 1946 (77 y.o. [name], [name] Primary Care Provider: Lorene Dy Other Clinician: Referring Provider: Treating Provider/Extender: [name] in [id number] Verbal / Phone Orders: No Diagnosis Coding ICD-10 Coding Code Description [phone] Non-pressure chronic ulcer of other part of left lower leg with other specified severity I87.312 Chronic venous hypertension (idiopathic) with ulcer of left lower extremity J84.9 Interstitial pulmonary disease, unspecified M06.9 Rheumatoid arthritis, unspecified Follow-up  Appointments ppointment in 1 week. - Dr. Celine Ahr covering for Dr. Heber Bluewell on 10/20/22 at 1100am Return A ppointment in 2 weeks. - Dr. Heber Tieton on 10/28/2022 1030 room 7 Return A Anesthetic (In clinic) Topical Lidocaine 5% applied to wound bed Cellular or Tissue Based Products Wound #5 Left,Distal,Anterior Lower Leg Cellular or Tissue Based Product Type: - 08/18/2022 insurance ran organogenesis- 100% covered. 08/26/2022 #1 Puraply AM applied. 09/01/2022 #2 Puraply AM applied. 09/09/2022 #3 Puraply AM applied to  both wounds. 09/15/2022 #4 Puraply AM applied to wound. 09/23/2022 #5 Puraply AM applied to wound. 09/30/22 # 6 PUraply AM applied to wound. 10/07/22 #7 Puraply AM applied to wound. 10/14/2022 #8 Puraply AM applied to wound. Cellular or Tissue Based Product applied to wound bed, secured with steri-strips, cover with Adaptic or Mepitel. (Hailey NOT REMOVE). Bathing/ Shower/ Hygiene May shower with protection but Hailey not get wound dressing(s) wet. Protect dressing(s) with water repellant cover (for example, large plastic bag) or a cast cover and may then take shower. Edema Control - Lymphedema / SCD / Other Elevate legs to the level of the heart or above for 30 minutes daily and/or when sitting for 3-4 times a day throughout the day. Avoid standing for long periods of time. Exercise regularly Compression stocking or Garment 20-30 mm/Hg pressure to: - start wearing the juxtalite HD apply in the morning and remove at night left leg. [name], [name] ([id number]) [id number] Page 5 of 11 Non Wound Condition Protect area with: - buttock area with AandD ointment daily Wound Treatment Wound #5 - Lower Leg Wound Laterality: Left, Anterior, Distal Cleanser: Wound Cleanser (Generic) 1 x Per Week/30 Days Discharge Instructions: Cleanse the wound with wound cleanser prior to applying a clean dressing using gauze sponges, not tissue or cotton balls. Prim Dressing: Puraply #8 1 x Per  Week/30 Days ary Discharge Instructions: applied by provider. Prim Dressing: Adaptic and steri-strips 1 x Per Week/30 Days ary Discharge Instructions: Used to secure Puraply AM. Leave in place. Hailey not remove. Secondary Dressing: Zetuvit Plus Silicone Border Dressing 5x5 (in/in) 1 x Per Week/30 Days Discharge Instructions: Apply silicone border over primary dressing as directed. Electronic Signature(s) Signed: 10/14/2022 12:14:37 PM By: Kalman Shan Hailey Entered By: Kalman Shan on 10/14/2022 12:12:53 -------------------------------------------------------------------------------- Problem List Details Patient Name: Date of Service: Hailey Courser, Hailey Hailey THY S. 10/14/2022 11:00 A M Medical Record Number: [id number] Patient Account Number: [id number] Date of Birth/Sex: Treating RN: 1945-12-06 (77 y.o. [name], [name] Primary Care Provider: Lorene Dy Other Clinician: Referring Provider: Treating Provider/Extender: [name] in Treatment: 19 Active Problems ICD-10 Encounter Code Description Active Date MDM Diagnosis L97.828 Non-pressure chronic ulcer of other part of left lower leg with other specified 06/03/2022 No Yes severity I87.312 Chronic venous hypertension (idiopathic) with ulcer of left lower extremity 06/03/2022 No Yes J84.9 Interstitial pulmonary disease, unspecified 06/03/2022 No Yes M06.9 Rheumatoid arthritis, unspecified 06/03/2022 No Yes Inactive Problems Resolved Problems Electronic Signature(s) Signed: 10/14/2022 12:14:37 PM By: Kalman Shan Hailey Entered By: Kalman Shan on 10/14/2022 12:10:14 -------------------------------------------------------------------------------- Progress Note Details Patient Name: Date of Service: Hailey [name], Hailey Hailey THY S. 10/14/2022 11:00 A M Medical Record Number: [id number] Patient Account Number: [id number] [name], [name] ([id number]) [phone].pdf Page 6 of 11 Date of  Birth/Sex: Treating RN: [birth year] (77 y.o. F) Primary Care Provider: Other Clinician: Lorene Dy Referring Provider: Treating Provider/Extender: [name] in Treatment: 19 Subjective Chief Complaint Information obtained from Patient 06/03/2022; Left lower extremity wounds History of Present Illness (HPI) Patient presents today for initial evaluation and our clinic as a referral from the [facility] health system Department of orthopedics for evaluation and treatment of the wound to his his of the left foot at the base of the great toe. Currently the good news is the patient really does not have any significant pain which is excellent. She has been seen by Dr. Linus Salmons at [facility]: infectious disease clinic that is the regional Center for  infectious disease. Subsequently the patient was cultured and positive for methicillin sensitive staph aureus. She has been on antibiotics including Cipro prior to surgery as will several days following surgery. She then had clindamycin which was changed to doxycycline. She took this for 10 days. Subsequently the pain had improved and there was no longer any possible draining from the wound according to the notes. The patient sedentary rate as well as C-reactive protein have returned to normal ranges. Currently per Dr. Henreitta Leber assessment there was one dehiscence with no sign of osteomyelitis. He therefore discontinue the antibiotics with the completion of the last three days Of doxycycline. Other than that he just set her for a follow-up as needed. The patient does not have diabetes and is not a current smoker. At this time her treatment that was recommended by the surgeon was applying a Hydrocolloid dressing. Based on what I'm seeing at this point the patient actually has some Slough noted over the surface of the wound there really does not appear to be evidence of infection though I Hailey think she does require some sharp  debridement to clear away the slough and help with appropriate wound healing. She does have areas of granulation buds noted. She may be a candidate for a skin substitute as well. We will see how things Hailey over the next period of time until follow-up. No fevers, chills, nausea, or vomiting noted at this time. 06/13/18 on evaluation today patient actually appears to be doing better in regard to her toe ulcer. She's been using the Prisma on this region and that seems to have done extremely well for her. Fortunately there does not appear to be evidence of infection at this time which is good news. She did see her surgeon they were extremely pleased with the overall appearance of her wound. 06/27/18 on evaluation today patient actually appears to be doing very well in regard to her foot ulcer. She has been tolerating the dressing changes without complication which is great news. She did see her podiatrist they felt like everything is looking very nice as well and will please. 07/05/2018 surgical wound on the left foot. She appears to be doing well. Still surface debrided to remove however in general post debridement the wound bed looks quite healthy it is come down significantly in terms of dimensions using silver collagen. The patient describes pain when her foot is elevated either in bed at night [sometimes keeps her awake] or when is propped up on a foot rest. She basically takes analgesics for this. She does not really describe claudication with activity however her activity is very limited by interstitial lung disease. Her ABIs initially in this clinic were noncompressible. She is not a diabetic 07/20/2018; surgical wound on the left foot. Wound actually looks quite a bit better than I remember seeing this. She is still describing pain with her leg elevated at night that she does not get when the wound is supine. She does not really describe claudication but she is very limited by her pulmonary status.  We have been using silver collagen. 1/17; the patient has been followed up by orthopedics and discharge. Her arterial studies were actually quite good and should not be contributing to any pain. Slight reduction in ABIs but otherwise normal. Her wound is closed. She saw Dr. Linus Salmons of infectious disease surrounding the surgery. By review of our notes she was not felt to have osteomyelitis. 06/03/2022 Ms. Aquira Staser is a 77 year old female with a past medical history  of interstitial lung disease on chronic oxygen via nasal cannula, rheumatoid arthritis, chronic diastolic heart failure and OSA that presents to the clinic for a 1 to 23-month history of nonhealing ulcer to the left lower extremity. On 04/20/2022 she was admitted to the hospital for left lower extremity cellulitis. She required 8 days of IV vancomycin and Unasyn followed by a 14-day course of Augmentin and doxycycline. She has been using Silvadene cream to the area. She does not use compression therapy but does own compression stockings. She currently denies signs of infection. 11/27; patient presents for follow-up she is using Medihoney and Hydrofera Blue to the wound beds. She has been using her Tubigrip. She has no issues or complaints today. 12/4; patient presents for follow-up. She continues to use Medihoney and Hydrofera Blue to the wound beds. Several attempts have been made to schedule ABIs with TBI's. We gave patient the number today to call to have these scheduled. She has been using Tubigrip. She has no issues or complaints today. 12/18; she continues to use Medihoney and Hydrofera Blue in general the surface of the wound is looking somewhat better this week and measurements are slightly smaller. She did have her arterial studies done these were noncompressible bilaterally however TBI on the right of 0.69 on the left 0.62. Waveforms on the right were triphasic on the left biphasic to triphasic. This does not suggest significant  arterial disease to make healing wounds at this location difficult. 12/29; patient presents for follow-up. She has been using Medihoney and Hydrofera Blue to the wound bed. She has been using Tubigrip. 1/5; patient presents for follow-up. She is been using Medihoney and Hydrofera Blue to the wound bed along with Tubigrip. The Tubigrip was doubled at last clinic visit. There is been improvement in wound healing. 1/12; patient presents for follow-up. She has been using Medihoney and Hydrofera Blue to the wound bed along with Tubigrip. There continues to be improvement in wound healing. 1/19; patient presents for follow-up. She has been using Medihoney and Hydrofera Blue to the wound bed along with her juxta light compression daily. There is been improvement in wound measurements. Patient has no issues or complaints today. 1/25; patient presents for follow-up. She has been using blast X and collagen to the wound bed under juxta light compression daily. She has no issues or complaints today. There is been improvement in wound healing. 2/1; patient presents for follow-up. She has been using blast X and collagen to the wound bed under juxta light compression daily. She has no issues or complaints today. 2/9; patient presents for follow-up. She has been using Hydrofera Blue with blast X under compression wrap daily. She has been approved for a skin substitute and patient would like to proceed with this. 2/15; patient presents for follow-up. PuraPly was placed in standard fashion to the large anterior leg wound. She has been using blast X and Hydrofera Blue to the more proximal small wound. She has been using her juxta light compression daily. She has no issues or complaints today. 2/23; patient presents for follow-up. PuraPly was placed in standard fashion to the large anterior leg wound. The more proximal small wound is almost healed and they have been using blast X and Hydrofera Blue. She has been using  her juxta light compression daily to the left lower extremity. There is been improvement in wound healing. SHYANNE, ZACCARDI ([id number]) [id number] Page 7 of 11 2/29; patient presents for follow-up. PuraPly was placed in standard fashion to the  large anterior leg wound. The more proximal wound has healed. She is been using her juxta light compression daily. She has no issues or complaints today. 3/8; patient presents for follow-up. PuraPly has been used to the wound bed weekly patient has done well with this. She has been using her juxta light compression daily. She has no issues or complaints today. 3/15; patient presents for follow-up. We have been using PuraPly to the wound bed weekly. She has been using her juxta light compression daily. She has been doing well with this treatment. Her wound has improved. She has no issues or complaints today. 3/22; patient presents for follow-up. We have been using PuraPly to the wound bed weekly. She has been using her juxta lite compression. Overall more granulation tissue present today. Patient has no issues or complaints today. 3/29; patient presents for follow-up. We have been using PuraPly to the wound bed weekly. She has been using her juxta lite compression daily. The wound is smaller. Patient History Information obtained from Patient. Family History Cancer - Maternal Grandparents, Diabetes - Siblings, Heart Disease - Maternal Grandparents,Paternal Grandparents,Mother,Father,Siblings, Hypertension - Mother,Father,Siblings, Stroke - Paternal Grandparents, No family history of Hereditary Spherocytosis, Kidney Disease, Lung Disease, Seizures, Thyroid Problems, Tuberculosis. Social History Never smoker, Marital Status - Married, Alcohol Use - Moderate, Drug Use - No History, Caffeine Use - Moderate. Medical History Eyes Denies history of Cataracts, Optic Neuritis Ear/Nose/Mouth/Throat Denies history of Chronic sinus  problems/congestion Hematologic/Lymphatic Denies history of Anemia, Hemophilia, Human Immunodeficiency Virus, Lymphedema, Sickle Cell Disease Respiratory Patient has history of Asthma, Sleep Apnea Denies history of Aspiration, Chronic Obstructive Pulmonary Disease (COPD), Pneumothorax, Tuberculosis Cardiovascular Patient has history of Hypertension Denies history of Angina, Arrhythmia, Congestive Heart Failure, Coronary Artery Disease, Deep Vein Thrombosis, Hypotension, Myocardial Infarction, Peripheral Arterial Disease, Peripheral Venous Disease, Phlebitis, Vasculitis Gastrointestinal Patient has history of Colitis Denies history of Cirrhosis , Crohnoos, Hepatitis A, Hepatitis B, Hepatitis C Endocrine Denies history of Type I Diabetes, Type II Diabetes Immunological Denies history of Lupus Erythematosus, Raynaudoos, Scleroderma Integumentary (Skin) Denies history of History of Burn Musculoskeletal Patient has history of Rheumatoid Arthritis, Osteoarthritis Denies history of Gout, Osteomyelitis Neurologic Denies history of Dementia, Neuropathy, Quadriplegia, Paraplegia, Seizure Disorder Oncologic Denies history of Received Chemotherapy, Received Radiation Psychiatric Denies history of Anorexia/bulimia, Confinement Anxiety Hospitalization/Surgery History - surgery toe. - right hip replacement 12/2018. - heart cath 12/17/2021. Medical A Surgical History Notes nd Respiratory 4L [place] 02 pulmonary fibrosis Objective Constitutional respirations regular, non-labored and within target range for patient.. Vitals Time Taken: 11:19 AM, Height: 61 in, Weight: 186 lbs, BMI: 35.1, Temperature: 98.0 F, Pulse: 91 bpm, Respiratory Rate: 18 breaths/min, Blood Pressure: 146/67 mmHg. Cardiovascular 2+ dorsalis pedis/posterior tibialis pulses. Psychiatric pleasant and cooperative. [name], [name] ([id number]) [id number] Page 8 of 11 General Notes: T the anterior  aspect of left lower extremity there is an open wound with slough and granulation tissue. No signs of infection. Obvious o deformity to the foot from previous surgeries. Integumentary (Hair, Skin) Wound #5 status is Open. Original cause of wound was Gradually Appeared. The date acquired was: 04/19/2022. The wound has been in treatment 19 weeks. The wound is located on the [facility] Lower Leg. The wound measures 4.2cm length x 2.1cm width x 0.1cm depth; 6.927cm^2 area and 0.693cm^3 volume. There is Fat Layer (Subcutaneous Tissue) exposed. There is no tunneling or undermining noted. There is a medium amount of serosanguineous drainage noted. The wound margin is distinct with the outline attached to the wound base.  There is large (67-100%) pink, pale granulation within the wound bed. There is a small (1-33%) amount of necrotic tissue within the wound bed including Adherent Slough. The periwound skin appearance did not exhibit: Callus, Crepitus, Excoriation, Induration, Rash, Scarring, Dry/Scaly, Maceration, Atrophie Blanche, Cyanosis, Ecchymosis, Hemosiderin Staining, Mottled, Pallor, Rubor, Erythema. Periwound temperature was noted as No Abnormality. Assessment Active Problems ICD-10 Non-pressure chronic ulcer of other part of left lower leg with other specified severity Chronic venous hypertension (idiopathic) with ulcer of left lower extremity Interstitial pulmonary disease, unspecified Rheumatoid arthritis, unspecified Patient's left lower extremity wound has shown improvement in size in appearance since last clinic visit. I debrided nonviable tissue. PuraPly was placed in standard fashion. Continue juxta light compression daily. Follow-up in 1 week. Procedures Wound #5 Pre-procedure diagnosis of Wound #5 is a Cellulitis located on the Left,Distal,Anterior Lower Leg . There was a Excisional Skin/Subcutaneous Tissue Debridement with a total area of 8.82 sq cm performed by Kalman Shan, Hailey. With the following instrument(s): Curette to remove Viable and Non-Viable tissue/material. Material removed includes Subcutaneous Tissue, Slough, Skin: Dermis, and Skin: Epidermis after achieving pain control using Lidocaine 5% topical ointment. A time out was conducted at 11:50, prior to the start of the procedure. A Minimum amount of bleeding was controlled with Pressure. The procedure was tolerated well with a pain level of 0 throughout and a pain level of 0 following the procedure. Post Debridement Measurements: 4.2cm length x 2.1cm width x 0.1cm depth; 0.693cm^3 volume. Character of Wound/Ulcer Post Debridement is improved. Post procedure Diagnosis Wound #5: Same as Pre-Procedure Pre-procedure diagnosis of Wound #5 is a Cellulitis located on the Left,Distal,Anterior Lower Leg. A skin graft procedure using a bioengineered skin substitute/cellular or tissue based product was performed by Kalman Shan, Hailey with the following instrument(s): Forceps and Scissors. Puraply AM was applied and secured with Steri-Strips and adaptic. 12 sq cm of product was utilized and 0 sq cm was wasted. Post Application, no dressing was applied. A Time Out was conducted at 11:56, prior to the start of the procedure. The procedure was tolerated well with a pain level of 0 throughout and a pain level of 0 following the procedure. Post procedure Diagnosis Wound #5: Same as Pre-Procedure . Plan Follow-up Appointments: Return Appointment in 1 week. - Dr. Celine Ahr covering for Dr. Heber [place] on 10/20/22 at 1100am Return Appointment in 2 weeks. - Dr. Heber [place] on 10/28/2022 1030 room 7 Anesthetic: (In clinic) Topical Lidocaine 5% applied to wound bed Cellular or Tissue Based Products: Wound #5 Left,Distal,Anterior Lower Leg: Cellular or Tissue Based Product Type: - 08/18/2022 insurance ran organogenesis- 100% covered. 08/26/2022 #1 Puraply AM applied. 09/01/2022 #2 Puraply AM applied. 09/09/2022 #3 Puraply AM applied to  both wounds. 09/15/2022 #4 Puraply AM applied to wound. 09/23/2022 #5 Puraply AM applied to wound. 09/30/22 # 6 PUraply AM applied to wound. 10/07/22 #7 Puraply AM applied to wound. 10/14/2022 #8 Puraply AM applied to wound. Cellular or Tissue Based Product applied to wound bed, secured with steri-strips, cover with Adaptic or Mepitel. (Hailey NOT REMOVE). Bathing/ Shower/ Hygiene: May shower with protection but Hailey not get wound dressing(s) wet. Protect dressing(s) with water repellant cover (for example, large plastic bag) or a cast cover and may then take shower. Edema Control - Lymphedema / SCD / Other: Elevate legs to the level of the heart or above for 30 minutes daily and/or when sitting for 3-4 times a day throughout the day. Avoid standing for long periods of time. Exercise regularly  Compression stocking or Garment 20-30 mm/Hg pressure to: - start wearing the juxtalite HD apply in the morning and remove at night left leg. Non Wound Condition: Protect area with: - buttock area with AandD ointment daily WOUND #5: - Lower Leg Wound Laterality: Left, Anterior, Distal Cleanser: Wound Cleanser (Generic) 1 x Per Week/30 Days Discharge Instructions: Cleanse the wound with wound cleanser prior to applying a clean dressing using gauze sponges, not tissue or cotton balls. Prim Dressing: Puraply #8 1 x Per Week/30 Days ary Discharge Instructions: applied by provider. [name], [name] ([id number]) [id number] Page 9 of 11 Prim Dressing: Adaptic and steri-strips 1 x Per Week/30 Days ary Discharge Instructions: Used to secure Puraply AM. Leave in place. Hailey not remove. Secondary Dressing: Zetuvit Plus Silicone Border Dressing 5x5 (in/in) 1 x Per Week/30 Days Discharge Instructions: Apply silicone border over primary dressing as directed. 1. In office sharp debridement 2. PuraPly placed in standard fashion 3. Juxta light compression daily 4. Follow-up in 1 week Electronic  Signature(s) Signed: 10/14/2022 12:14:37 PM By: Kalman Shan Hailey Entered By: Kalman Shan on 10/14/2022 12:13:55 -------------------------------------------------------------------------------- HxROS Details Patient Name: Date of Service: Hailey Courser, Hailey Hailey THY S. 10/14/2022 11:00 A M Medical Record Number: [id number] Patient Account Number: [id number] Date of Birth/Sex: Treating RN: 25-Dec-1945 (77 y.o. F) Primary Care Provider: Lorene Dy Other Clinician: Referring Provider: Treating Provider/Extender: [name] in Treatment: 19 Information Obtained From Patient Eyes Medical History: Negative for: Cataracts; Optic Neuritis Ear/Nose/Mouth/Throat Medical History: Negative for: Chronic sinus problems/congestion Hematologic/Lymphatic Medical History: Negative for: Anemia; Hemophilia; Human Immunodeficiency Virus; Lymphedema; Sickle Cell Disease Respiratory Medical History: Positive for: Asthma; Sleep Apnea Negative for: Aspiration; Chronic Obstructive Pulmonary Disease (COPD); Pneumothorax; Tuberculosis Past Medical History Notes: 4L [place] 02 pulmonary fibrosis Cardiovascular Medical History: Positive for: Hypertension Negative for: Angina; Arrhythmia; Congestive Heart Failure; Coronary Artery Disease; Deep Vein Thrombosis; Hypotension; Myocardial Infarction; Peripheral Arterial Disease; Peripheral Venous Disease; Phlebitis; Vasculitis Gastrointestinal Medical History: Positive for: Colitis Negative for: Cirrhosis ; Crohns; Hepatitis A; Hepatitis B; Hepatitis C Endocrine Medical History: Negative for: Type I Diabetes; Type II Diabetes Immunological Medical History: Negative for: Lupus Erythematosus; Raynauds; Scleroderma [name], [name] ([id number]) [id number] Page 10 of 11 Integumentary (Skin) Medical History: Negative for: History of Burn Musculoskeletal Medical History: Positive for: Rheumatoid Arthritis;  Osteoarthritis Negative for: Gout; Osteomyelitis Neurologic Medical History: Negative for: Dementia; Neuropathy; Quadriplegia; Paraplegia; Seizure Disorder Oncologic Medical History: Negative for: Received Chemotherapy; Received Radiation Psychiatric Medical History: Negative for: Anorexia/bulimia; Confinement Anxiety Immunizations Pneumococcal Vaccine: Received Pneumococcal Vaccination: Yes Received Pneumococcal Vaccination On or After 60th Birthday: Yes Implantable Devices No devices added Hospitalization / Surgery History Type of Hospitalization/Surgery surgery toe right hip replacement 12/2018 heart cath 12/17/2021 Family and Social History Cancer: Yes - Maternal Grandparents; Diabetes: Yes - Siblings; Heart Disease: Yes - Maternal Grandparents,Paternal Grandparents,Mother,Father,Siblings; Hereditary Spherocytosis: No; Hypertension: Yes - Mother,Father,Siblings; Kidney Disease: No; Lung Disease: No; Seizures: No; Stroke: Yes - Paternal Grandparents; Thyroid Problems: No; Tuberculosis: No; Never smoker; Marital Status - Married; Alcohol Use: Moderate; Drug Use: No History; Caffeine Use: Moderate; Financial Concerns: No; Food, Clothing or Shelter Needs: No; Support System Lacking: No; Transportation Concerns: No Electronic Signature(s) Signed: 10/14/2022 12:14:37 PM By: Kalman Shan Hailey Entered By: Kalman Shan on 10/14/2022 12:12:20 -------------------------------------------------------------------------------- SuperBill Details Patient Name: Date of Service: Hailey [name], Hailey Hailey THY S. 10/14/2022 Medical Record Number: [id number] Patient Account Number: [id number] Date of Birth/Sex: Treating RN: 09-Oct-1945 (77 y.o. [name] Primary Care Provider:  Lorene Dy Other Clinician: Referring Provider: Treating Provider/Extender: [name] in Treatment: 19 Diagnosis Coding ICD-10 Codes Code Description [phone] Non-pressure chronic ulcer of  other part of left lower leg with other specified severity I87.312 Chronic venous hypertension (idiopathic) with ulcer of left lower extremity J84.9 Interstitial pulmonary disease, unspecified M06.9 Rheumatoid arthritis, unspecified Facility Procedures [name], [name] ([id number]): CPT4 Code Description [id number] PuraPly AM 3X4 (12sq. cm) enter 12qty [phone].pdf Page 11 of 11: Modifier [address] DEVLIN, CANTRELLE ([id number]): [id number] - SKIN SUB GRAFT TRNK/ARM/LEG ICD-10 Diagnosis Description I87.312 Chronic venous hypertension (idiopathic) with ulcer of left lower L97.828 Non-pressure chronic ulcer of other part of left lower leg with ot [phone].pdf Page 11 of 11: 1 extremity her specified severity Physician Procedures : CPT4 Code Description Modifier [id number] - WC PHYS SKIN SUB GRAFT TRNK/ARM/LEG ICD-10 Diagnosis Description I87.312 Chronic venous hypertension (idiopathic) with ulcer of left lower extremity L97.828 Non-pressure chronic ulcer of other part of left  lower leg with other specified severity Quantity: 1 Electronic Signature(s) Signed: 10/14/2022 12:14:37 PM By: Kalman Shan Hailey Entered By: Kalman Shan on 10/14/2022 12:14:06

## 2022-10-15 NOTE — Progress Notes (Signed)
[name], [name] ([id number]) [id number] Page 1 of 7 Visit Report for 10/14/2022 Arrival Information Details Patient Name: Date of Service: [name], [name] 10/14/2022 11:00 A M Medical Record Number: [id number] Patient Account Number: [id number] Date of Birth/Sex: Treating RN: Nov 29, 1945 (77 y.o. F) Primary Care Naziyah Tieszen: Lorene Dy Other Clinician: Referring Evangela Heffler: Treating Bently Wyss/Extender: [name] in Treatment: 43 Visit Information History Since Last Visit Added or deleted any medications: No Patient Arrived: Wheel Chair Any new allergies or adverse reactions: No Arrival Time: 11:18 Had a fall or experienced change in No Accompanied By: husband activities of daily living that may affect Transfer Assistance: None risk of falls: Patient Identification Verified: Yes Signs or symptoms of abuse/neglect since last visito No Secondary Verification Process Completed: Yes Hospitalized since last visit: No Patient Requires Transmission-Based Precautions: No Implantable device outside of the clinic excluding No Patient Has Alerts: Yes cellular tissue based products placed in the center Patient Alerts: ABI's: L: N/C 11/23 since last visit: Has Dressing in Place as Prescribed: Yes Pain Present Now: Yes Electronic Signature(s) Signed: 10/14/2022 11:27:45 AM By: Sandre Kitty Entered By: Sandre Kitty on 10/14/2022 11:19:12 -------------------------------------------------------------------------------- Encounter Discharge Information Details Patient Name: Date of Service: [name], [name] 10/14/2022 11:00 A M Medical Record Number: [id number] Patient Account Number: [id number] Date of Birth/Sex: Treating RN: August 30, 1945 (77 y.o. [name] Primary Care Yessika Otte: Lorene Dy Other Clinician: Referring Zahirah Cheslock: Treating [name] [name]/Extender: [name] in Treatment:  19 Encounter Discharge Information Items Post Procedure Vitals Discharge Condition: Stable Temperature (F): 98 Ambulatory Status: Wheelchair Pulse (bpm): 91 Discharge Destination: Home Respiratory Rate (breaths/min): 18 Transportation: Private Auto Blood Pressure (mmHg): 146/67 Accompanied By: husband Schedule Follow-up Appointment: Yes Clinical Summary of Care: Electronic Signature(s) Signed: 10/14/2022 4:18:03 PM By: Deon Pilling RN, BSN Entered By: Deon Pilling on 10/14/2022 12:01:42 -------------------------------------------------------------------------------- Lower Extremity Assessment Details Patient Name: Date of Service: [name], [name] 10/14/2022 11:00 A M Medical Record Number: [id number] Patient Account Number: [id number] Date of Birth/Sex: Treating RN: [birth year] (77 y.o. [name], [name] Primary Care Ananya Mccleese: Lorene Dy Other Clinician: Referring Tamon Parkerson: Treating Yaa Donnellan/Extender: [name] in Treatment: 19 Edema Assessment Assessed: [name]: No] [name]: No] C[LeftTINAMARIE, [name] [id number] [Right: [id number] Page 2 of 7] Edema: [Left: N] [Right: o] Calf Left: Right: Point of Measurement: 29 cm From Medial Instep 29.8 cm Ankle Left: Right: Point of Measurement: 9 cm From Medial Instep 23 cm Electronic Signature(s) Signed: 10/14/2022 4:18:03 PM By: Deon Pilling RN, BSN Entered By: Deon Pilling on 10/14/2022 11:33:38 -------------------------------------------------------------------------------- Multi Wound Chart Details Patient Name: Date of Service: [name], [name] 10/14/2022 11:00 A M Medical Record Number: [id number] Patient Account Number: [id number] Date of Birth/Sex: Treating RN: January 03, 1946 (77 y.o. F) Primary Care Lejuan Botto: Lorene Dy Other Clinician: Referring Talayeh Bruinsma: Treating Dhanya Bogle/Extender: [name] in Treatment: 19 Vital  Signs Height(in): 61 Pulse(bpm): 91 Weight(lbs): 186 Blood Pressure(mmHg): 146/67 Body Mass Index(BMI): 35.1 Temperature(F): 98.0 Respiratory Rate(breaths/min): 18 [5:Photos:] [N/A:N/A] Left, Distal, Anterior Lower Leg N/A N/A Wound Location: Gradually Appeared N/A N/A Wounding Event: Cellulitis N/A N/A Primary Etiology: Asthma, Sleep Apnea, Hypertension, N/A N/A Comorbid History: Colitis, Rheumatoid Arthritis, Osteoarthritis 04/19/2022 N/A N/A Date Acquired: 69 N/A N/A Weeks of Treatment: Open N/A N/A Wound Status: No N/A N/A Wound Recurrence: 4.2x2.1x0.1 N/A N/A Measurements L x W x D (cm) 6.927 N/A N/A A (cm) : rea  0.693 N/A N/A Volume (cm) : 55.50% N/A N/A % Reduction in A rea: 55.40% N/A N/A % Reduction in Volume: Full Thickness With Exposed Support N/A N/A Classification: Structures Medium N/A N/A Exudate A mount: Serosanguineous N/A N/A Exudate Type: red, brown N/A N/A Exudate Color: Distinct, outline attached N/A N/A Wound Margin: Large (67-100%) N/A N/A Granulation A mount: Pink, Pale N/A N/A Granulation Quality: Small (1-33%) N/A N/A Necrotic A mount: Fat Layer (Subcutaneous Tissue): Yes N/A N/A Exposed Structures: Fascia: No Tendon: No Muscle: No Joint: No Bone: No Medium (34-66%) N/A N/A Epithelialization: Debridement - Excisional N/A N/A Debridement: Pre-procedure Verification/Time Out 11:50 N/A N/A [name], [name] ([name]) [id number] Page 3 of 7 Taken: Lidocaine 5% topical ointment N/A N/A Pain Control: Subcutaneous, Slough N/A N/A Tissue Debrided: Skin/Subcutaneous Tissue N/A N/A Level: 8.82 N/A N/A Debridement A (sq cm): rea Curette N/A N/A Instrument: Minimum N/A N/A Bleeding: Pressure N/A N/A Hemostasis A chieved: 0 N/A N/A Procedural Pain: 0 N/A N/A Post Procedural Pain: Procedure was tolerated well N/A N/A Debridement Treatment Response: 4.2x2.1x0.1 N/A N/A Post Debridement  Measurements L x W x D (cm) 0.693 N/A N/A Post Debridement Volume: (cm) Excoriation: No N/A N/A Periwound Skin Texture: Induration: No Callus: No Crepitus: No Rash: No Scarring: No Maceration: No N/A N/A Periwound Skin Moisture: Dry/Scaly: No Atrophie Blanche: No N/A N/A Periwound Skin Color: Cyanosis: No Ecchymosis: No Erythema: No Hemosiderin Staining: No Mottled: No Pallor: No Rubor: No No Abnormality N/A N/A Temperature: Cellular or Tissue Based Product N/A N/A Procedures Performed: Debridement Treatment Notes Wound #5 (Lower Leg) Wound Laterality: Left, Anterior, Distal Cleanser Wound Cleanser Discharge Instruction: Cleanse the wound with wound cleanser prior to applying a clean dressing using gauze sponges, not tissue or cotton balls. Peri-Wound Care Topical Primary Dressing Puraply #8 Discharge Instruction: applied by [place] Perra. Adaptic and steri-strips Discharge Instruction: Used to secure Puraply AM. Leave in place. Hailey not remove. Secondary Dressing Zetuvit Plus Silicone Border Dressing 5x5 (in/in) Discharge Instruction: Apply silicone border over primary dressing as directed. Secured With Compression Wrap Compression Stockings Environmental education officer) Signed: 10/14/2022 12:14:37 PM By: Kalman Shan Hailey Entered By: Kalman Shan on 10/14/2022 12:10:19 -------------------------------------------------------------------------------- Multi-Disciplinary Care Plan Details Patient Name: Date of Service: [name], [name] 10/14/2022 11:00 A M Medical Record Number: [name] Patient Account Number: [id number] Date of Birth/Sex: Treating RN: 01/29/1946 (77 y.o. [name], [name] Primary Care Lurlean Kernen: Lorene Dy Other Clinician: Referring Erasmus Bistline: Treating Hunter Pinkard/Extender: [name] in Treatment: [address], [place] ([name]) [id number] Page 4 of 7 Active Inactive Pain, Acute  or Chronic Nursing Diagnoses: Pain, acute or chronic: actual or potential Potential alteration in comfort, pain Goals: Patient will verbalize adequate pain control and receive pain control interventions during procedures as needed Date Initiated: 06/03/2022 Target Resolution Date: 11/18/2022 Goal Status: Active Patient/caregiver will verbalize comfort level met Date Initiated: 06/03/2022 Target Resolution Date: 11/18/2022 Goal Status: Active Interventions: Encourage patient to take pain medications as prescribed Provide education on pain management Treatment Activities: Administer pain control measures as ordered : 06/03/2022 Notes: Electronic Signature(s) Signed: 10/14/2022 4:18:03 PM By: Deon Pilling RN, BSN Entered By: Deon Pilling on 10/14/2022 11:35:33 -------------------------------------------------------------------------------- Pain Assessment Details Patient Name: Date of Service: [name], [name] 10/14/2022 11:00 A M Medical Record Number: [name] Patient Account Number: [id number] Date of Birth/Sex: Treating RN: [birth year] (77 y.o. F) Primary Care Setsuko Robins: Lorene Dy Other Clinician: Referring Sherell Christoffel: Treating Monico Sudduth/Extender: [name] in  Treatment: 19 Active Problems Location of Pain Severity and Description of Pain Patient Has Paino Yes Site Locations Rate the pain. Current Pain Level: 5 Pain Management and Medication Current Pain Management: Electronic Signature(s) Signed: 10/14/2022 11:27:45 AM By: Sandre Kitty Entered By: Sandre Kitty on 10/14/2022 11:19:44 [name], [name] ([id number]) [id number] Page 5 of 7 -------------------------------------------------------------------------------- Patient/Caregiver Education Details Patient Name: Date of Service: [name], Hailey RO [facility] 3/29/2024andnbsp11:00 [place] Record Number: [id number] Patient Account Number: [id number] Date of  Birth/Gender: Treating RN: 1946/04/26 (77 y.o. [name] Primary Care Physician: Lorene Dy Other Clinician: Referring Physician: Treating Physician/Extender: [name] in Treatment: 19 Education Assessment Education Provided To: Patient Education Topics Provided Wound/Skin Impairment: Handouts: Caring for Your Ulcer Methods: Explain/Verbal Responses: Reinforcements needed Electronic Signature(s) Signed: 10/14/2022 4:18:03 PM By: Deon Pilling RN, BSN Entered By: Deon Pilling on 10/14/2022 11:35:48 -------------------------------------------------------------------------------- Wound Assessment Details Patient Name: Date of Service: [name], [name] 10/14/2022 11:00 A M Medical Record Number: [id number] Patient Account Number: [id number] Date of Birth/Sex: Treating RN: Sep 18, 1945 (77 y.o. [name], [name] Primary Care Miranda Garber: Lorene Dy Other Clinician: Referring Humzah Harty: Treating Gila Lauf/Extender: [name] in Treatment: 19 Wound Status Wound Number: 5 Primary Cellulitis Etiology: Wound Location: Left, Distal, Anterior Lower Leg Wound Open Wounding Event: Gradually Appeared Status: Date Acquired: 04/19/2022 Comorbid Asthma, Sleep Apnea, Hypertension, Colitis, Rheumatoid Weeks Of Treatment: 19 History: Arthritis, Osteoarthritis Clustered Wound: No Photos Wound Measurements Length: (cm) 4.2 Width: (cm) 2.1 Depth: (cm) 0.1 Area: (cm) 6.927 Volume: (cm) 0.693 % Reduction in Area: 55.5% % Reduction in Volume: 55.4% Epithelialization: Medium (34-66%) Tunneling: No Undermining: No Wound Description Classification: Full Thickness With Exposed Support Structures [name], [name] ([id number]) Wound Margin: Distinct, outline attached Exudate Amount: Medium Exudate Type: Serosanguineous Exudate Color: red, brown Foul Odor After Cleansing: No [phone].pdf Page 6 of  7 Slough/Fibrino Yes Wound Bed Granulation Amount: Large (67-100%) Exposed Structure Granulation Quality: Pink, Pale Fascia Exposed: No Necrotic Amount: Small (1-33%) Fat Layer (Subcutaneous Tissue) Exposed: Yes Necrotic Quality: Adherent Slough Tendon Exposed: No Muscle Exposed: No Joint Exposed: No Bone Exposed: No Periwound Skin Texture Texture Color No Abnormalities Noted: No No Abnormalities Noted: No Callus: No Atrophie Blanche: No Crepitus: No Cyanosis: No Excoriation: No Ecchymosis: No Induration: No Erythema: No Rash: No Hemosiderin Staining: No Scarring: No Mottled: No Pallor: No Moisture Rubor: No No Abnormalities Noted: No Dry / Scaly: No Temperature / Pain Maceration: No Temperature: No Abnormality Treatment Notes Wound #5 (Lower Leg) Wound Laterality: Left, Anterior, Distal Cleanser Wound Cleanser Discharge Instruction: Cleanse the wound with wound cleanser prior to applying a clean dressing using gauze sponges, not tissue or cotton balls. Peri-Wound Care Topical Primary Dressing Puraply #8 Discharge Instruction: applied by [name]. Adaptic and steri-strips Discharge Instruction: Used to secure Puraply AM. Leave in place. Hailey not remove. Secondary Dressing Zetuvit Plus Silicone Border Dressing 5x5 (in/in) Discharge Instruction: Apply silicone border over primary dressing as directed. Secured With Compression Wrap Compression Stockings Environmental education officer) Signed: 10/14/2022 4:18:03 PM By: Deon Pilling RN, BSN Entered By: Deon Pilling on 10/14/2022 11:32:06 -------------------------------------------------------------------------------- Vitals Details Patient Name: Date of Service: [name], [name] 10/14/2022 11:00 A M Medical Record Number: [id number] Patient Account Number: [id number] Date of Birth/Sex: Treating RN: 22-Aug-1945 (77 y.o. F) Primary Care Ione [place]: Lorene Dy Other Clinician: Referring Olegario Emberson: Treating  Emerlyn Mehlhoff/Extender: [name] in Treatment: 61 Vital Signs [place], [place] (  [id number] Page 7 of 7 Time Taken: 11:19 Temperature (F): 98.0 Height (in): 61 Pulse (bpm): 91 Weight (lbs): 186 Respiratory Rate (breaths/min): 18 Body Mass Index (BMI): 35.1 Blood Pressure (mmHg): 146/67 Reference Range: 80 - 120 mg / dl Electronic Signature(s) Signed: 10/14/2022 11:27:45 AM By: Sandre Kitty Entered By: Sandre Kitty on 10/14/2022 11:19:30

## 2022-10-19 ENCOUNTER — Ambulatory Visit: Payer: Medicare Other | Admitting: Cardiology

## 2022-10-19 NOTE — Progress Notes (Signed)
PATSIE, PROCHAZKA ([id number]) [id number] Page 1 of 6 Visit Report for 09/30/2022 Arrival Information Details Patient Name: Date of Service: Hailey Courser, DO [name] 09/30/2022 11:00 A M Medical Record Number: [id number] Patient Account Number: [id number] Date of Birth/Sex: Treating RN: Jun 23, 1946 (77 y.o. F) Primary Care Loie Jahr: Lorene Dy Other Clinician: Referring Levelle Edelen: Treating Sierra Bissonette/Extender: [name] in Treatment: 23 Visit Information History Since Last Visit All ordered tests and consults were completed: No Patient Arrived: Wheel Chair Added or deleted any medications: No Arrival Time: 11:08 Any new allergies or adverse reactions: No Accompanied By: husband Had a fall or experienced change in Yes Transfer Assistance: None activities of daily living that may affect Patient Identification Verified: Yes risk of falls: Secondary Verification Process Completed: Yes Signs or symptoms of abuse/neglect since last visito No Patient Requires Transmission-Based Precautions: No Hospitalized since last visit: No Patient Has Alerts: Yes Implantable device outside of the clinic excluding No Patient Alerts: ABI's: L: N/C 11/23 cellular tissue based products placed in the center since last visit: Pain Present Now: No Electronic Signature(s) Signed: 09/30/2022 2:15:07 PM By: Worthy Rancher Entered By: Worthy Rancher on 09/30/2022 11:08:46 -------------------------------------------------------------------------------- Encounter Discharge Information Details Patient Name: Date of Service: Hailey [name], DO [name] 09/30/2022 11:00 A M Medical Record Number: [id number] Patient Account Number: [id number] Date of Birth/Sex: Treating RN: 1945/08/27 (77 y.o. [name], [name] Primary Care Zerenity Bowron: Lorene Dy Other Clinician: Referring Paizlie Klaus: Treating Yamil Oelke/Extender: [name] in Treatment:  17 Encounter Discharge Information Items Post Procedure Vitals Discharge Condition: Stable Temperature (F): 98.7 Ambulatory Status: Wheelchair Pulse (bpm): 74 Discharge Destination: Home Respiratory Rate (breaths/min): 17 Transportation: Private Auto Blood Pressure (mmHg): 120/80 Accompanied By: husband Schedule Follow-up Appointment: Yes Clinical Summary of Care: Patient Declined Electronic Signature(s) Signed: 10/19/2022 4:26:45 PM By: Rhae Hammock RN Entered By: Rhae Hammock on 09/30/2022 11:46:11 -------------------------------------------------------------------------------- Lower Extremity Assessment Details Patient Name: Date of Service: Hailey Courser, DO [name] 09/30/2022 11:00 A M Medical Record Number: [id number] Patient Account Number: [id number] Date of Birth/Sex: Treating RN: 16-Sep-1945 (77 y.o. [name], [name] Primary Care Harrol Novello: Lorene Dy Other Clinician: Referring Clarrissa Shimkus: Treating Omario Ander/Extender: [name] in Treatment: 17 Edema Assessment Assessed: [name]: Yes] [name]: No] C[LeftIrena Reichmann [id number] [Right: [id number] Page 2 of 6] Edema: [Left: N] [Right: o] Calf Left: Right: Point of Measurement: 29 cm From Medial Instep 31 cm Ankle Left: Right: Point of Measurement: 9 cm From Medial Instep 20.7 cm Vascular Assessment Pulses: Dorsalis Pedis Palpable: [Left:Yes] Posterior Tibial Palpable: [Left:Yes] Electronic Signature(s) Signed: 10/19/2022 4:26:45 PM By: Rhae Hammock RN Entered By: Rhae Hammock on 09/30/2022 11:10:33 -------------------------------------------------------------------------------- Multi Wound Chart Details Patient Name: Date of Service: Hailey Courser, DO [name] 09/30/2022 11:00 A M Medical Record Number: [id number] Patient Account Number: [id number] Date of Birth/Sex: Treating RN: 1946-04-04 (77 y.o. F) Primary Care Morrissa Shein: Lorene Dy  Other Clinician: Referring Marzetta Lanza: Treating Kobey Sides/Extender: [name] in Treatment: 17 Vital Signs Height(in): 61 Pulse(bpm): 91 Weight(lbs): 186 Blood Pressure(mmHg): 131/71 Body Mass Index(BMI): 35.1 Temperature(F): 98.2 Respiratory Rate(breaths/min): 18 [5:Photos:] [N/A:N/A] Left, Distal, Anterior Lower Leg N/A N/A Wound Location: Gradually Appeared N/A N/A Wounding Event: Cellulitis N/A N/A Primary Etiology: Asthma, Sleep Apnea, Hypertension, N/A N/A Comorbid History: Colitis, Rheumatoid Arthritis, Osteoarthritis 04/19/2022 N/A N/A Date Acquired: 17 N/A N/A Weeks of Treatment: Open N/A N/A Wound Status: No N/A N/A Wound Recurrence: 4.5x2.4x0.1 N/A N/A Measurements L  x W x D (cm) 8.482 N/A N/A A (cm) : rea 0.848 N/A N/A Volume (cm) : 45.50% N/A N/A % Reduction in Area: 45.50% N/A N/A % Reduction in Volume: Full Thickness With Exposed Support N/A N/A Classification: Structures Medium N/A N/A Exudate Amount: Serosanguineous N/A N/A Exudate Type: red, brown N/A N/A Exudate Color: Distinct, outline attached N/A N/A Wound Margin: Large (67-100%) N/A N/A Granulation Amount: Pink, Pale N/A N/A Granulation QualityRAEANNE, MARSICANO ([id number]) [id number] Page 3 of 6 Small (1-33%) N/A N/A Necrotic Amount: Fat Layer (Subcutaneous Tissue): Yes N/A N/A Exposed Structures: Fascia: No Tendon: No Muscle: No Joint: No Bone: No Medium (34-66%) N/A N/A Epithelialization: Debridement - Selective/Open Wound N/A N/A Debridement: Pre-procedure Verification/Time Out 11:25 N/A N/A Taken: Lidocaine N/A N/A Pain Control: Slough N/A N/A Tissue Debrided: Non-Viable Tissue N/A N/A Level: 10.8 N/A N/A Debridement A (sq cm): rea Curette N/A N/A Instrument: Minimum N/A N/A Bleeding: Pressure N/A N/A Hemostasis A chieved: 0 N/A N/A Procedural Pain: 0 N/A N/A Post Procedural Pain: Procedure  was tolerated well N/A N/A Debridement Treatment Response: 4.5x2.4x0.1 N/A N/A Post Debridement Measurements L x W x D (cm) 0.848 N/A N/A Post Debridement Volume: (cm) Excoriation: No N/A N/A Periwound Skin Texture: Induration: No Callus: No Crepitus: No Rash: No Scarring: No Maceration: No N/A N/A Periwound Skin Moisture: Dry/Scaly: No Atrophie Blanche: No N/A N/A Periwound Skin Color: Cyanosis: No Ecchymosis: No Erythema: No Hemosiderin Staining: No Mottled: No Pallor: No Rubor: No No Abnormality N/A N/A Temperature: Cellular or Tissue Based Product N/A N/A Procedures Performed: Debridement Treatment Notes Wound #5 (Lower Leg) Wound Laterality: Left, Anterior, Distal Cleanser Wound Cleanser Discharge Instruction: Cleanse the wound with wound cleanser prior to applying a clean dressing using gauze sponges, not tissue or cotton balls. Peri-Wound Care Topical Primary Dressing Puraply #6 Discharge Instruction: applied by Iris Tatsch. Adaptic and steri-strips Discharge Instruction: Used to secure Puraply AM. Leave in place. Do not remove. Secondary Dressing Zetuvit Plus Silicone Border Dressing 5x5 (in/in) Discharge Instruction: Apply silicone border over primary dressing as directed. Secured With Compression Wrap Compression Stockings Add-Ons Electronic Signature(s) Signed: 09/30/2022 1:36:39 PM By: Kalman Shan DO Entered By: Kalman Shan on 09/30/2022 13:02:23 [name] ([id number]) [id number] Page 4 of 6 -------------------------------------------------------------------------------- Multi-Disciplinary Care Plan Details Patient Name: Date of Service: Hailey Courser, DO [name] 09/30/2022 11:00 A M Medical Record Number: [id number] Patient Account Number: [id number] Date of Birth/Sex: Treating RN: 10-Jun-1946 (77 y.o. [name], [name] Primary Care Ayako Tapanes: Lorene Dy Other Clinician: Referring Kourtnie Sachs: Treating  Kamri Gotsch/Extender: [name] in Treatment: 17 Active Inactive Pain, Acute or Chronic Nursing Diagnoses: Pain, acute or chronic: actual or potential Potential alteration in comfort, pain Goals: Patient will verbalize adequate pain control and receive pain control interventions during procedures as needed Date Initiated: 06/03/2022 Target Resolution Date: 10/14/2022 Goal Status: Active Patient/caregiver will verbalize comfort level met Date Initiated: 06/03/2022 Target Resolution Date: 10/14/2022 Goal Status: Active Interventions: Encourage patient to take pain medications as prescribed Provide education on pain management Treatment Activities: Administer pain control measures as ordered : 06/03/2022 Notes: Electronic Signature(s) Signed: 10/19/2022 4:26:45 PM By: Rhae Hammock RN Entered By: Rhae Hammock on 09/30/2022 11:07:32 -------------------------------------------------------------------------------- Pain Assessment Details Patient Name: Date of Service: Hailey Courser, DO [name] 09/30/2022 11:00 A M Medical Record Number: [id number] Patient Account Number: [id number] Date of Birth/Sex: Treating RN: [birth year] (77 y.o. F) Primary Care Endre Coutts: Lorene Dy Other Clinician: Referring Cleora Karnik: Treating Whitman Meinhardt/Extender: Kalman Shan  Herby Abraham in Treatment: 17 Active Problems Location of Pain Severity and Description of Pain Patient Has Paino Yes Site Locations Rate the pain. Current Pain Level: 2 Worst Pain Level: 10 Least Pain Level: 0 Tolerable Pain Level: 1 JAKAYLIN, LATNEY ([id number]) [id number] Page 5 of 6 Pain Management and Medication Current Pain Management: Electronic Signature(s) Signed: 09/30/2022 2:15:07 PM By: Worthy Rancher Entered By: Worthy Rancher on 09/30/2022 11:09:41 -------------------------------------------------------------------------------- Patient/Caregiver Education  Details Patient Name: Date of Service: Hailey [name], DO RO Marry Guan. 3/15/2024andnbsp11:00 [place] Record Number: [id number] Patient Account Number: [id number] Date of Birth/Gender: Treating RN: Nov 24, 1945 (77 y.o. [name], [name] Primary Care Physician: Lorene Dy Other Clinician: Referring Physician: Treating Physician/Extender: [name] in Treatment: 16 Education Assessment Education Provided To: Patient Education Topics Provided Wound/Skin Impairment: Methods: Explain/Verbal Responses: Reinforcements needed, State content correctly [employer]) Signed: 10/19/2022 4:26:45 PM By: Rhae Hammock RN Entered By: Rhae Hammock on 09/30/2022 11:07:44 -------------------------------------------------------------------------------- Wound Assessment Details Patient Name: Date of Service: Hailey Courser, DO [name] 09/30/2022 11:00 A M Medical Record Number: [id number] Patient Account Number: [id number] Date of Birth/Sex: Treating RN: 09-02-1945 (77 y.o. F) Primary Care Maleka Contino: Lorene Dy Other Clinician: Referring Arlin Sass: Treating Giordano Getman/Extender: [name] in Treatment: 17 Wound Status Wound Number: 5 Primary Cellulitis Etiology: Wound Location: Left, Distal, Anterior Lower Leg Wound Open Wounding Event: Gradually Appeared Status: Date Acquired: 04/19/2022 Comorbid Asthma, Sleep Apnea, Hypertension, Colitis, Rheumatoid Weeks Of Treatment: 17 History: Arthritis, Osteoarthritis Clustered Wound: No Photos Wound Measurements MAUREN, MELBY ([id number]) Length: (cm) 4.5 Width: (cm) 2.4 Depth: (cm) 0.1 Area: (cm) 8.482 Volume: (cm) 0.848 [id number] Page 6 of 6 % Reduction in Area: 45.5% % Reduction in Volume: 45.5% Epithelialization: Medium (34-66%) Wound Description Classification: Full Thickness With Exposed Support Structures Wound Margin: Distinct, outline  attached Exudate Amount: Medium Exudate Type: Serosanguineous Exudate Color: red, brown Foul Odor After Cleansing: No Slough/Fibrino Yes Wound Bed Granulation Amount: Large (67-100%) Exposed Structure Granulation Quality: Pink, Pale Fascia Exposed: No Necrotic Amount: Small (1-33%) Fat Layer (Subcutaneous Tissue) Exposed: Yes Necrotic Quality: Adherent Slough Tendon Exposed: No Muscle Exposed: No Joint Exposed: No Bone Exposed: No Periwound Skin Texture Texture Color No Abnormalities Noted: No No Abnormalities Noted: No Callus: No Atrophie Blanche: No Crepitus: No Cyanosis: No Excoriation: No Ecchymosis: No Induration: No Erythema: No Rash: No Hemosiderin Staining: No Scarring: No Mottled: No Pallor: No Moisture Rubor: No No Abnormalities Noted: No Dry / Scaly: No Temperature / Pain Maceration: No Temperature: No Abnormality Electronic Signature(s) Signed: 09/30/2022 11:50:53 AM By: Sandre Kitty Entered By: Sandre Kitty on 09/30/2022 11:07:35 -------------------------------------------------------------------------------- Vitals Details Patient Name: Date of Service: Hailey [name], DO [name] 09/30/2022 11:00 A M Medical Record Number: [id number] Patient Account Number: [id number] Date of Birth/Sex: Treating RN: 1945/08/29 (77 y.o. F) Primary Care Arick Mareno: Lorene Dy Other Clinician: Referring Kadar Chance: Treating Derk Doubek/Extender: [name] in Treatment: 17 Vital Signs Time Taken: 11:00 Temperature (F): 98.2 Height (in): 61 Pulse (bpm): 91 Weight (lbs): 186 Respiratory Rate (breaths/min): 18 Body Mass Index (BMI): 35.1 Blood Pressure (mmHg): 131/71 Reference Range: 80 - 120 mg / dl Electronic Signature(s) Signed: 09/30/2022 2:15:07 PM By: Worthy Rancher Entered By: Worthy Rancher on 09/30/2022 11:09:18

## 2022-10-19 NOTE — Progress Notes (Signed)
KALIYHA, ZAMARRIPA ([id number]) [id number] Page 1 of 11 Visit Report for 09/30/2022 Chief Complaint Document Details Patient Name: Date of Service: Hailey Courser, DO [name] 09/30/2022 11:00 A M Medical Record Number: [id number] Patient Account Number: [id number] Date of Birth/Sex: Treating RN: 05-23-1946 (77 y.o. F) Primary Care Provider: Lorene Dy Other Clinician: Referring Provider: Treating Provider/Extender: [name] in Treatment: 17 Information Obtained from: Patient Chief Complaint 06/03/2022; Left lower extremity wounds Electronic Signature(s) Signed: 09/30/2022 1:36:39 PM By: Kalman Shan DO Entered By: Kalman Shan on 09/30/2022 13:02:40 -------------------------------------------------------------------------------- Cellular or Tissue Based Product Details Patient Name: Date of Service: Hailey Courser, DO [name] 09/30/2022 11:00 A M Medical Record Number: [id number] Patient Account Number: [id number] Date of Birth/Sex: Treating RN: 1945/10/10 (77 y.o. [name], Lauren Primary Care Provider: Lorene Dy Other Clinician: Referring Provider: Treating Provider/Extender: [name] in Treatment: 17 Cellular or Tissue Based Product Type Wound #5 Left,Distal,Anterior Lower Leg Applied to: Performed By: Physician Kalman Shan, DO Cellular or Tissue Based Product Type: Puraply AM Level of Consciousness (Pre-procedure): Awake and Alert Pre-procedure Verification/Time Out Yes - 11:30 Taken: Location: trunk / arms / legs Wound Size (sq cm): 10.8 Product Size (sq cm): 12 Waste Size (sq cm): 0 Amount of Product Applied (sq cm): 12 Instrument Used: Forceps, Scissors Lot #: [id number] Expiration Date: 08/09/2024 Fenestrated: No Reconstituted: Yes Solution Type: NS Solution Amount: 84ml Lot #: [id number] Solution Expiration Date: 11/14/2024 Secured: Yes Secured With:  Steri-Strips Dressing Applied: Yes Primary Dressing: adaptic Procedural Pain: 0 Post Procedural Pain: 0 Response to Treatment: Procedure was tolerated well Level of Consciousness (Post- Awake and Alert procedure): Post Procedure Diagnosis Same as Pre-procedure Electronic Signature(s) Signed: 09/30/2022 1:36:39 PM By: Kalman Shan DO Signed: 10/19/2022 4:26:45 PM By: Rhae Hammock RN Entered By: Rhae Hammock on 09/30/2022 11:31:53 [place], Arvil Persons ([id number]) [id number] Page 2 of 11 -------------------------------------------------------------------------------- Debridement Details Patient Name: Date of Service: Hailey Courser, DO [name] 09/30/2022 11:00 A M Medical Record Number: [id number] Patient Account Number: [id number] Date of Birth/Sex: Treating RN: April 06, 1946 (77 y.o. [name], Lauren Primary Care Provider: Lorene Dy Other Clinician: Referring Provider: Treating Provider/Extender: [name] in Treatment: 17 Debridement Performed for Assessment: Wound #5 Left,Distal,Anterior Lower Leg Performed By: Physician Kalman Shan, DO Debridement Type: Debridement Level of Consciousness (Pre-procedure): Awake and Alert Pre-procedure Verification/Time Out Yes - 11:25 Taken: Start Time: 11:25 Pain Control: Lidocaine T Area Debrided (L x W): otal 4.5 (cm) x 2.4 (cm) = 10.8 (cm) Tissue and other material debrided: Viable, Non-Viable, Slough, Slough Level: Non-Viable Tissue Debridement Description: Selective/Open Wound Instrument: Curette Bleeding: Minimum Hemostasis Achieved: Pressure End Time: 11:25 Procedural Pain: 0 Post Procedural Pain: 0 Response to Treatment: Procedure was tolerated well Level of Consciousness (Post- Awake and Alert procedure): Post Debridement Measurements of Total Wound Length: (cm) 4.5 Width: (cm) 2.4 Depth: (cm) 0.1 Volume: (cm) 0.848 Character of Wound/Ulcer Post  Debridement: Improved Post Procedure Diagnosis Same as Pre-procedure Electronic Signature(s) Signed: 09/30/2022 1:36:39 PM By: Kalman Shan DO Signed: 10/19/2022 4:26:45 PM By: Rhae Hammock RN Entered By: Rhae Hammock on 09/30/2022 11:43:21 -------------------------------------------------------------------------------- HPI Details Patient Name: Date of Service: Hailey Courser, DO [name] 09/30/2022 11:00 A M Medical Record Number: [id number] Patient Account Number: [id number] Date of Birth/Sex: Treating RN: 06-22-1946 (77 y.o. F) Primary Care Provider: Lorene Dy Other Clinician: Referring Provider: Treating Provider/Extender: [name] in Treatment: 17 History of Present  Illness HPI Description: Patient presents today for initial evaluation and our clinic as a referral from the [facility] health system Department of orthopedics for evaluation and treatment of the wound to his his of the left foot at the base of the great toe. Currently the good news is the patient really does not have any significant pain which is excellent. She has been seen by Dr. Linus Salmons at [facility]: infectious disease clinic that is the regional Center for infectious disease. Subsequently the patient was cultured and positive for methicillin sensitive staph aureus. She has been on antibiotics including Cipro prior to surgery as will several days following surgery. She then had clindamycin which was changed to doxycycline. She took this for 10 days. Subsequently the pain had improved and there was no longer any possible draining from the wound according to the notes. The patient sedentary rate as well as C-reactive protein have returned to normal ranges. Currently per Dr. Henreitta Leber assessment there was one dehiscence with no sign of osteomyelitis. He therefore discontinue the antibiotics with the completion of the last three days Of doxycycline. Other than that he just set her for a  follow-up as needed. The patient does not have diabetes and is not a current smoker. At this time her treatment that was recommended by the surgeon was applying a Hydrocolloid dressing. Based on what I'm seeing at this point the patient actually has some Slough noted over the surface of the wound there really does not appear to be evidence of infection though I do think she does require some sharp debridement to clear away the slough and help with appropriate wound healing. She does have areas of granulation buds noted. She may be a candidate for a skin substitute as well. We will see how things do over the next period of time until follow-up. No fevers, chills, nausea, or vomiting noted at this time. CANICE, BALTHAZAR ([id number]) [id number] Page 3 of 11 06/13/18 on evaluation today patient actually appears to be doing better in regard to her toe ulcer. She's been using the Prisma on this region and that seems to have done extremely well for her. Fortunately there does not appear to be evidence of infection at this time which is good news. She did see her surgeon they were extremely pleased with the overall appearance of her wound. 06/27/18 on evaluation today patient actually appears to be doing very well in regard to her foot ulcer. She has been tolerating the dressing changes without complication which is great news. She did see her podiatrist they felt like everything is looking very nice as well and will please. 07/05/2018 surgical wound on the left foot. She appears to be doing well. Still surface debrided to remove however in general post debridement the wound bed looks quite healthy it is come down significantly in terms of dimensions using silver collagen. The patient describes pain when her foot is elevated either in bed at night [sometimes keeps her awake] or when is propped up on a foot rest. She basically takes analgesics for this. She does not really describe  claudication with activity however her activity is very limited by interstitial lung disease. Her ABIs initially in this clinic were noncompressible. She is not a diabetic 07/20/2018; surgical wound on the left foot. Wound actually looks quite a bit better than I remember seeing this. She is still describing pain with her leg elevated at night that she does not get when the wound is supine. She does not really  describe claudication but she is very limited by her pulmonary status. We have been using silver collagen. 1/17; the patient has been followed up by orthopedics and discharge. Her arterial studies were actually quite good and should not be contributing to any pain. Slight reduction in ABIs but otherwise normal. Her wound is closed. She saw Dr. Linus Salmons of infectious disease surrounding the surgery. By review of our notes she was not felt to have osteomyelitis. 06/03/2022 Ms. Tanessa Manygoats is a 77 year old female with a past medical history of interstitial lung disease on chronic oxygen via nasal cannula, rheumatoid arthritis, chronic diastolic heart failure and OSA that presents to the clinic for a 1 to 66-month history of nonhealing ulcer to the left lower extremity. On 04/20/2022 she was admitted to the hospital for left lower extremity cellulitis. She required 8 days of IV vancomycin and Unasyn followed by a 14-day course of Augmentin and doxycycline. She has been using Silvadene cream to the area. She does not use compression therapy but does own compression stockings. She currently denies signs of infection. 11/27; patient presents for follow-up she is using Medihoney and Hydrofera Blue to the wound beds. She has been using her Tubigrip. She has no issues or complaints today. 12/4; patient presents for follow-up. She continues to use Medihoney and Hydrofera Blue to the wound beds. Several attempts have been made to schedule ABIs with TBI's. We gave patient the number today to call to have these  scheduled. She has been using Tubigrip. She has no issues or complaints today. 12/18; she continues to use Medihoney and Hydrofera Blue in general the surface of the wound is looking somewhat better this week and measurements are slightly smaller. She did have her arterial studies done these were noncompressible bilaterally however TBI on the right of 0.69 on the left 0.62. Waveforms on the right were triphasic on the left biphasic to triphasic. This does not suggest significant arterial disease to make healing wounds at this location difficult. 12/29; patient presents for follow-up. She has been using Medihoney and Hydrofera Blue to the wound bed. She has been using Tubigrip. 1/5; patient presents for follow-up. She is been using Medihoney and Hydrofera Blue to the wound bed along with Tubigrip. The Tubigrip was doubled at last clinic visit. There is been improvement in wound healing. 1/12; patient presents for follow-up. She has been using Medihoney and Hydrofera Blue to the wound bed along with Tubigrip. There continues to be improvement in wound healing. 1/19; patient presents for follow-up. She has been using Medihoney and Hydrofera Blue to the wound bed along with her juxta light compression daily. There is been improvement in wound measurements. Patient has no issues or complaints today. 1/25; patient presents for follow-up. She has been using blast X and collagen to the wound bed under juxta light compression daily. She has no issues or complaints today. There is been improvement in wound healing. 2/1; patient presents for follow-up. She has been using blast X and collagen to the wound bed under juxta light compression daily. She has no issues or complaints today. 2/9; patient presents for follow-up. She has been using Hydrofera Blue with blast X under compression wrap daily. She has been approved for a skin substitute and patient would like to proceed with this. 2/15; patient presents for  follow-up. PuraPly was placed in standard fashion to the large anterior leg wound. She has been using blast X and Hydrofera Blue to the more proximal small wound. She has been using her  juxta light compression daily. She has no issues or complaints today. 2/23; patient presents for follow-up. PuraPly was placed in standard fashion to the large anterior leg wound. The more proximal small wound is almost healed and they have been using blast X and Hydrofera Blue. She has been using her juxta light compression daily to the left lower extremity. There is been improvement in wound healing. 2/29; patient presents for follow-up. PuraPly was placed in standard fashion to the large anterior leg wound. The more proximal wound has healed. She is been using her juxta light compression daily. She has no issues or complaints today. 3/8; patient presents for follow-up. PuraPly has been used to the wound bed weekly patient has done well with this. She has been using her juxta light compression daily. She has no issues or complaints today. 3/15; patient presents for follow-up. We have been using PuraPly to the wound bed weekly. She has been using her juxta light compression daily. She has been doing well with this treatment. Her wound has improved. She has no issues or complaints today. Electronic Signature(s) Signed: 09/30/2022 1:36:39 PM By: Kalman Shan DO Entered By: Kalman Shan on 09/30/2022 13:03:08 -------------------------------------------------------------------------------- Physical Exam Details Patient Name: Date of Service: Hailey Courser, DO [name] 09/30/2022 11:00 A M Medical Record Number: [id number] Patient Account Number: [id number] Date of Birth/Sex: Treating RN: 06/19/1946 (77 y.o. F) Primary Care Provider: Lorene Dy Other Clinician: Referring Provider: Treating Provider/Extender: [name] in Treatment: [address] [place] ([id number])  [id number] Page 4 of 11 Constitutional respirations regular, non-labored and within target range for patient.. Cardiovascular 2+ dorsalis pedis/posterior tibialis pulses. Psychiatric pleasant and cooperative. Notes T the anterior aspect of left lower extremity there is an open wound with slough and granulation tissue. No signs of infection. Obvious deformity to the foot o from previous surgeries. Electronic Signature(s) Signed: 09/30/2022 1:36:39 PM By: Kalman Shan DO Entered By: Kalman Shan on 09/30/2022 13:03:46 -------------------------------------------------------------------------------- Physician Orders Details Patient Name: Date of Service: Hailey Courser, DO [name] 09/30/2022 11:00 A M Medical Record Number: [id number] Patient Account Number: [id number] Date of Birth/Sex: Treating RN: 10-30-1945 (77 y.o. [name], Lauren Primary Care Provider: Lorene Dy Other Clinician: Referring Provider: Treating Provider/Extender: [name] in Treatment: [phone] Verbal / Phone Orders: No Diagnosis Coding Follow-up Appointments ppointment in 1 week. - Dr. Heber [place] 1015 Friday 10/07/2022 Return A ppointment in 2 weeks. - Dr. Heber [place] Rm # 9 Friday 10/14/2022 @ 11:00 Return A Anesthetic (In clinic) Topical Lidocaine 5% applied to wound bed Cellular or Tissue Based Products Wound #5 Left,Distal,Anterior Lower Leg Cellular or Tissue Based Product Type: - 08/18/2022 insurance ran organogenesis- 100% covered. 08/26/2022 #1 Puraply AM applied. 09/01/2022 #2 Puraply AM applied. 09/09/2022 #3 Puraply AM applied to both wounds. 09/15/2022 #4 Puraply AM applied to wound. 09/23/2022 #5 Puraply AM applied to wound. 09/30/22 # 6 PUraply AM applied to wound. Cellular or Tissue Based Product applied to wound bed, secured with steri-strips, cover with Adaptic or Mepitel. (DO NOT REMOVE). Bathing/ Shower/ Hygiene May shower with protection but  do not get wound dressing(s) wet. Protect dressing(s) with water repellant cover (for example, large plastic bag) or a cast cover and may then take shower. Edema Control - Lymphedema / SCD / Other Elevate legs to the level of the heart or above for 30 minutes daily and/or when sitting for 3-4 times a day throughout the day. Avoid standing for long periods of time.  Exercise regularly Compression stocking or Garment 20-30 mm/Hg pressure to: - start wearing the juxtalite HD apply in the morning and remove at night left leg. Non Wound Condition Protect area with: - buttock area with AandD ointment daily Wound Treatment Wound #5 - Lower Leg Wound Laterality: Left, Anterior, Distal Cleanser: Wound Cleanser (Generic) 1 x Per Week/30 Days Discharge Instructions: Cleanse the wound with wound cleanser prior to applying a clean dressing using gauze sponges, not tissue or cotton balls. Prim Dressing: Puraply #6 1 x Per Week/30 Days ary Discharge Instructions: applied by provider. Prim Dressing: Adaptic and steri-strips 1 x Per Week/30 Days ary Discharge Instructions: Used to secure Puraply AM. Leave in place. Do not remove. Secondary Dressing: Zetuvit Plus Silicone Border Dressing 5x5 (in/in) 1 x Per Week/30 Days BRENDON, NEYENS ([id number]) [id number] Page 5 of 11 Discharge Instructions: Apply silicone border over primary dressing as directed. Electronic Signature(s) Signed: 09/30/2022 1:36:39 PM By: Kalman Shan DO Entered By: Kalman Shan on 09/30/2022 13:03:54 -------------------------------------------------------------------------------- Problem List Details Patient Name: Date of Service: Hailey Courser, DO [name] 09/30/2022 11:00 A M Medical Record Number: [id number] Patient Account Number: [id number] Date of Birth/Sex: Treating RN: 05/06/1946 (77 y.o. F) Primary Care Provider: Lorene Dy Other Clinician: Referring Provider: Treating Provider/Extender:  [name] in Treatment: 17 Active Problems ICD-10 Encounter Code Description Active Date MDM Diagnosis L97.828 Non-pressure chronic ulcer of other part of left lower leg with other specified 06/03/2022 No Yes severity I87.312 Chronic venous hypertension (idiopathic) with ulcer of left lower extremity 06/03/2022 No Yes J84.9 Interstitial pulmonary disease, unspecified 06/03/2022 No Yes M06.9 Rheumatoid arthritis, unspecified 06/03/2022 No Yes Inactive Problems Resolved Problems Electronic Signature(s) Signed: 09/30/2022 1:36:39 PM By: Kalman Shan DO Entered By: Kalman Shan on 09/30/2022 13:02:17 -------------------------------------------------------------------------------- Progress Note Details Patient Name: Date of Service: Hailey Courser, DO [name] 09/30/2022 11:00 A M Medical Record Number: [id number] Patient Account Number: [id number] Date of Birth/Sex: Treating RN: 03/27/1946 (77 y.o. F) Primary Care Provider: Lorene Dy Other Clinician: Referring Provider: Treating Provider/Extender: [name] in Treatment: 17 Subjective Chief Complaint Information obtained from Patient 06/03/2022; Left lower extremity wounds History of Present Illness (HPI) Patient presents today for initial evaluation and our clinic as a referral from the [facility] health system Department of orthopedics for evaluation and treatment of the wound to his his of the left foot at the base of the great toe. Currently the good news is the patient really does not have any significant pain which is excellent. She has been seen by Dr. Linus Salmons at [facility]: infectious disease clinic that is the regional Center for infectious disease. Subsequently the [place] ([id number]) [id number] Page 6 of 11 patient was cultured and positive for methicillin sensitive staph aureus. She has been on antibiotics including Cipro  prior to surgery as will several days following surgery. She then had clindamycin which was changed to doxycycline. She took this for 10 days. Subsequently the pain had improved and there was no longer any possible draining from the wound according to the notes. The patient sedentary rate as well as C-reactive protein have returned to normal ranges. Currently per Dr. Henreitta Leber assessment there was one dehiscence with no sign of osteomyelitis. He therefore discontinue the antibiotics with the completion of the last three days Of doxycycline. Other than that he just set her for a follow-up as needed. The patient does not have diabetes and is not a current smoker. At this  time her treatment that was recommended by the surgeon was applying a Hydrocolloid dressing. Based on what I'm seeing at this point the patient actually has some Slough noted over the surface of the wound there really does not appear to be evidence of infection though I do think she does require some sharp debridement to clear away the slough and help with appropriate wound healing. She does have areas of granulation buds noted. She may be a candidate for a skin substitute as well. We will see how things do over the next period of time until follow-up. No fevers, chills, nausea, or vomiting noted at this time. 06/13/18 on evaluation today patient actually appears to be doing better in regard to her toe ulcer. She's been using the Prisma on this region and that seems to have done extremely well for her. Fortunately there does not appear to be evidence of infection at this time which is good news. She did see her surgeon they were extremely pleased with the overall appearance of her wound. 06/27/18 on evaluation today patient actually appears to be doing very well in regard to her foot ulcer. She has been tolerating the dressing changes without complication which is great news. She did see her podiatrist they felt like everything is looking  very nice as well and will please. 07/05/2018 surgical wound on the left foot. She appears to be doing well. Still surface debrided to remove however in general post debridement the wound bed looks quite healthy it is come down significantly in terms of dimensions using silver collagen. The patient describes pain when her foot is elevated either in bed at night [sometimes keeps her awake] or when is propped up on a foot rest. She basically takes analgesics for this. She does not really describe claudication with activity however her activity is very limited by interstitial lung disease. Her ABIs initially in this clinic were noncompressible. She is not a diabetic 07/20/2018; surgical wound on the left foot. Wound actually looks quite a bit better than I remember seeing this. She is still describing pain with her leg elevated at night that she does not get when the wound is supine. She does not really describe claudication but she is very limited by her pulmonary status. We have been using silver collagen. 1/17; the patient has been followed up by orthopedics and discharge. Her arterial studies were actually quite good and should not be contributing to any pain. Slight reduction in ABIs but otherwise normal. Her wound is closed. She saw Dr. Linus Salmons of infectious disease surrounding the surgery. By review of our notes she was not felt to have osteomyelitis. 06/03/2022 Ms. [name] is a 77 year old female with a past medical history of interstitial lung disease on chronic oxygen via nasal cannula, rheumatoid arthritis, chronic diastolic heart failure and OSA that presents to the clinic for a 1 to 22-month history of nonhealing ulcer to the left lower extremity. On 04/20/2022 she was admitted to the hospital for left lower extremity cellulitis. She required 8 days of IV vancomycin and Unasyn followed by a 14-day course of Augmentin and doxycycline. She has been using Silvadene cream to the area. She does  not use compression therapy but does own compression stockings. She currently denies signs of infection. 11/27; patient presents for follow-up she is using Medihoney and Hydrofera Blue to the wound beds. She has been using her Tubigrip. She has no issues or complaints today. 12/4; patient presents for follow-up. She continues to use Medihoney and  Hydrofera Blue to the wound beds. Several attempts have been made to schedule ABIs with TBI's. We gave patient the number today to call to have these scheduled. She has been using Tubigrip. She has no issues or complaints today. 12/18; she continues to use Medihoney and Hydrofera Blue in general the surface of the wound is looking somewhat better this week and measurements are slightly smaller. She did have her arterial studies done these were noncompressible bilaterally however TBI on the right of 0.69 on the left 0.62. Waveforms on the right were triphasic on the left biphasic to triphasic. This does not suggest significant arterial disease to make healing wounds at this location difficult. 12/29; patient presents for follow-up. She has been using Medihoney and Hydrofera Blue to the wound bed. She has been using Tubigrip. 1/5; patient presents for follow-up. She is been using Medihoney and Hydrofera Blue to the wound bed along with Tubigrip. The Tubigrip was doubled at last clinic visit. There is been improvement in wound healing. 1/12; patient presents for follow-up. She has been using Medihoney and Hydrofera Blue to the wound bed along with Tubigrip. There continues to be improvement in wound healing. 1/19; patient presents for follow-up. She has been using Medihoney and Hydrofera Blue to the wound bed along with her juxta light compression daily. There is been improvement in wound measurements. Patient has no issues or complaints today. 1/25; patient presents for follow-up. She has been using blast X and collagen to the wound bed under juxta light  compression daily. She has no issues or complaints today. There is been improvement in wound healing. 2/1; patient presents for follow-up. She has been using blast X and collagen to the wound bed under juxta light compression daily. She has no issues or complaints today. 2/9; patient presents for follow-up. She has been using Hydrofera Blue with blast X under compression wrap daily. She has been approved for a skin substitute and patient would like to proceed with this. 2/15; patient presents for follow-up. PuraPly was placed in standard fashion to the large anterior leg wound. She has been using blast X and Hydrofera Blue to the more proximal small wound. She has been using her juxta light compression daily. She has no issues or complaints today. 2/23; patient presents for follow-up. PuraPly was placed in standard fashion to the large anterior leg wound. The more proximal small wound is almost healed and they have been using blast X and Hydrofera Blue. She has been using her juxta light compression daily to the left lower extremity. There is been improvement in wound healing. 2/29; patient presents for follow-up. PuraPly was placed in standard fashion to the large anterior leg wound. The more proximal wound has healed. She is been using her juxta light compression daily. She has no issues or complaints today. 3/8; patient presents for follow-up. PuraPly has been used to the wound bed weekly patient has done well with this. She has been using her juxta light compression daily. She has no issues or complaints today. 3/15; patient presents for follow-up. We have been using PuraPly to the wound bed weekly. She has been using her juxta light compression daily. She has been doing well with this treatment. Her wound has improved. She has no issues or complaints today. Patient History Information obtained from Patient. Family History Cancer - Maternal Grandparents, Diabetes - Siblings, Heart Disease -  Maternal Grandparents,Paternal Grandparents,Mother,Father,Siblings, Hypertension - Mother,Father,Siblings, Stroke - Paternal Grandparents, No family history of Hereditary Spherocytosis, Kidney Disease, Lung Disease,  Seizures, Thyroid Problems, Tuberculosis. JOYICE, FUDA ([id number]) [id number] Page 7 of 11 Social History Never smoker, Marital Status - Married, Alcohol Use - Moderate, Drug Use - No History, Caffeine Use - Moderate. Medical History Eyes Denies history of Cataracts, Optic Neuritis Ear/Nose/Mouth/Throat Denies history of Chronic sinus problems/congestion Hematologic/Lymphatic Denies history of Anemia, Hemophilia, Human Immunodeficiency Virus, Lymphedema, Sickle Cell Disease Respiratory Patient has history of Asthma, Sleep Apnea Denies history of Aspiration, Chronic Obstructive Pulmonary Disease (COPD), Pneumothorax, Tuberculosis Cardiovascular Patient has history of Hypertension Denies history of Angina, Arrhythmia, Congestive Heart Failure, Coronary Artery Disease, Deep Vein Thrombosis, Hypotension, Myocardial Infarction, Peripheral Arterial Disease, Peripheral Venous Disease, Phlebitis, Vasculitis Gastrointestinal Patient has history of Colitis Denies history of Cirrhosis , Crohnoos, Hepatitis A, Hepatitis B, Hepatitis C Endocrine Denies history of Type I Diabetes, Type II Diabetes Immunological Denies history of Lupus Erythematosus, Raynaudoos, Scleroderma Integumentary (Skin) Denies history of History of Burn Musculoskeletal Patient has history of Rheumatoid Arthritis, Osteoarthritis Denies history of Gout, Osteomyelitis Neurologic Denies history of Dementia, Neuropathy, Quadriplegia, Paraplegia, Seizure Disorder Oncologic Denies history of Received Chemotherapy, Received Radiation Psychiatric Denies history of Anorexia/bulimia, Confinement Anxiety Hospitalization/Surgery History - surgery toe. - right hip replacement 12/2018. -  heart cath 12/17/2021. Medical A Surgical History Notes nd Respiratory 4L [place] 02 pulmonary fibrosis Objective Constitutional respirations regular, non-labored and within target range for patient.. Vitals Time Taken: 11:00 AM, Height: 61 in, Weight: 186 lbs, BMI: 35.1, Temperature: 98.2 F, Pulse: 91 bpm, Respiratory Rate: 18 breaths/min, Blood Pressure: 131/71 mmHg. Cardiovascular 2+ dorsalis pedis/posterior tibialis pulses. Psychiatric pleasant and cooperative. General Notes: T the anterior aspect of left lower extremity there is an open wound with slough and granulation tissue. No signs of infection. Obvious o deformity to the foot from previous surgeries. Integumentary (Hair, Skin) Wound #5 status is Open. Original cause of wound was Gradually Appeared. The date acquired was: 04/19/2022. The wound has been in treatment 17 weeks. The wound is located on the [facility] Lower Leg. The wound measures 4.5cm length x 2.4cm width x 0.1cm depth; 8.482cm^2 area and 0.848cm^3 volume. There is Fat Layer (Subcutaneous Tissue) exposed. There is a medium amount of serosanguineous drainage noted. The wound margin is distinct with the outline attached to the wound base. There is large (67-100%) pink, pale granulation within the wound bed. There is a small (1-33%) amount of necrotic tissue within the wound bed including Adherent Slough. The periwound skin appearance did not exhibit: Callus, Crepitus, Excoriation, Induration, Rash, Scarring, Dry/Scaly, Maceration, Atrophie Blanche, Cyanosis, Ecchymosis, Hemosiderin Staining, Mottled, Pallor, Rubor, Erythema. Periwound temperature was noted as No Abnormality. Assessment Active Problems ICD-10 Non-pressure chronic ulcer of other part of left lower leg with other specified severity Chronic venous hypertension (idiopathic) with ulcer of left lower extremity NOELINE, PONTRELLI ([id number]) [id number] Page 8 of  11 Interstitial pulmonary disease, unspecified Rheumatoid arthritis, unspecified Patient's wound has shown improvement in size and appearance since last clinic visit. I debrided nonviable tissue. PuraPly was placed in standard fashion. I recommended continuing compression Velcro wrap daily. Follow-up in 1 week. Procedures Wound #5 Pre-procedure diagnosis of Wound #5 is a Cellulitis located on the Left,Distal,Anterior Lower Leg . There was a Selective/Open Wound Non-Viable Tissue Debridement with a total area of 10.8 sq cm performed by Kalman Shan, DO. With the following instrument(s): Curette to remove Viable and Non-Viable tissue/material. Material removed includes [place] after achieving pain control using Lidocaine. No specimens were taken. A time out was conducted at 11:25, prior to the start of the  procedure. A Minimum amount of bleeding was controlled with Pressure. The procedure was tolerated well with a pain level of 0 throughout and a pain level of 0 following the procedure. Post Debridement Measurements: 4.5cm length x 2.4cm width x 0.1cm depth; 0.848cm^3 volume. Character of Wound/Ulcer Post Debridement is improved. Post procedure Diagnosis Wound #5: Same as Pre-Procedure Pre-procedure diagnosis of Wound #5 is a Cellulitis located on the Left,Distal,Anterior Lower Leg. A skin graft procedure using a bioengineered skin substitute/cellular or tissue based product was performed by Kalman Shan, DO with the following instrument(s): Forceps and Scissors. Puraply AM was applied and secured with Steri-Strips. 12 sq cm of product was utilized and 0 sq cm was wasted. Post Application, adaptic was applied. A Time Out was conducted at 11:30, prior to the start of the procedure. The procedure was tolerated well with a pain level of 0 throughout and a pain level of 0 following the procedure. Post procedure Diagnosis Wound #5: Same as Pre-Procedure . Plan Follow-up Appointments: Return  Appointment in 1 week. - Dr. Heber [place] 1015 Friday 10/07/2022 Return Appointment in 2 weeks. - Dr. Heber [place] Rm # 9 Friday 10/14/2022 @ 11:00 Anesthetic: (In clinic) Topical Lidocaine 5% applied to wound bed Cellular or Tissue Based Products: Wound #5 Left,Distal,Anterior Lower Leg: Cellular or Tissue Based Product Type: - 08/18/2022 insurance ran organogenesis- 100% covered. 08/26/2022 #1 Puraply AM applied. 09/01/2022 #2 Puraply AM applied. 09/09/2022 #3 Puraply AM applied to both wounds. 09/15/2022 #4 Puraply AM applied to wound. 09/23/2022 #5 Puraply AM applied to wound. 09/30/22 # 6 PUraply AM applied to wound. Cellular or Tissue Based Product applied to wound bed, secured with steri-strips, cover with Adaptic or Mepitel. (DO NOT REMOVE). Bathing/ Shower/ Hygiene: May shower with protection but do not get wound dressing(s) wet. Protect dressing(s) with water repellant cover (for example, large plastic bag) or a cast cover and may then take shower. Edema Control - Lymphedema / SCD / Other: Elevate legs to the level of the heart or above for 30 minutes daily and/or when sitting for 3-4 times a day throughout the day. Avoid standing for long periods of time. Exercise regularly Compression stocking or Garment 20-30 mm/Hg pressure to: - start wearing the juxtalite HD apply in the morning and remove at night left leg. Non Wound Condition: Protect area with: - buttock area with AandD ointment daily WOUND #5: - Lower Leg Wound Laterality: Left, Anterior, Distal Cleanser: Wound Cleanser (Generic) 1 x Per Week/30 Days Discharge Instructions: Cleanse the wound with wound cleanser prior to applying a clean dressing using gauze sponges, not tissue or cotton balls. Prim Dressing: Puraply #6 1 x Per Week/30 Days ary Discharge Instructions: applied by provider. Prim Dressing: Adaptic and steri-strips 1 x Per Week/30 Days ary Discharge Instructions: Used to secure Puraply AM. Leave in place. Do not  remove. Secondary Dressing: Zetuvit Plus Silicone Border Dressing 5x5 (in/in) 1 x Per Week/30 Days Discharge Instructions: Apply silicone border over primary dressing as directed. 1. In office sharp debridement 2. PuraPly placed in standard fashion 3. Juxta light compression daily 4. Follow-up in 1 week Electronic Signature(s) Signed: 09/30/2022 1:36:39 PM By: Kalman Shan DO Entered By: Kalman Shan on 09/30/2022 13:04:45 HxROS Details -------------------------------------------------------------------------------- [name] ([id number]) [id number] Page 9 of 11 Patient Name: Date of Service: Hailey Courser, DO [name] 09/30/2022 11:00 A M Medical Record Number: [id number] Patient Account Number: [id number] Date of Birth/Sex: Treating RN: January 14, 1946 (77 y.o. F) Primary Care Provider: Lorene Dy Other Clinician:  Referring Provider: Treating Provider/Extender: [name] in Treatment: 17 Information Obtained From Patient Eyes Medical History: Negative for: Cataracts; Optic Neuritis Ear/Nose/Mouth/Throat Medical History: Negative for: Chronic sinus problems/congestion Hematologic/Lymphatic Medical History: Negative for: Anemia; Hemophilia; Human Immunodeficiency Virus; Lymphedema; Sickle Cell Disease Respiratory Medical History: Positive for: Asthma; Sleep Apnea Negative for: Aspiration; Chronic Obstructive Pulmonary Disease (COPD); Pneumothorax; Tuberculosis Past Medical History Notes: 4L [place] 02 pulmonary fibrosis Cardiovascular Medical History: Positive for: Hypertension Negative for: Angina; Arrhythmia; Congestive Heart Failure; Coronary Artery Disease; Deep Vein Thrombosis; Hypotension; Myocardial Infarction; Peripheral Arterial Disease; Peripheral Venous Disease; Phlebitis; Vasculitis Gastrointestinal Medical History: Positive for: Colitis Negative for: Cirrhosis ; Crohns; Hepatitis A; Hepatitis B;  Hepatitis C Endocrine Medical History: Negative for: Type I Diabetes; Type II Diabetes Immunological Medical History: Negative for: Lupus Erythematosus; Raynauds; Scleroderma Integumentary (Skin) Medical History: Negative for: History of Burn Musculoskeletal Medical History: Positive for: Rheumatoid Arthritis; Osteoarthritis Negative for: Gout; Osteomyelitis Neurologic Medical History: Negative for: Dementia; Neuropathy; Quadriplegia; Paraplegia; Seizure Disorder Oncologic Medical History: Negative for: Received Chemotherapy; Received Radiation BRITANI, DURBEN ([id number]) [id number] Page 10 of 11 Psychiatric Medical History: Negative for: Anorexia/bulimia; Confinement Anxiety Immunizations Pneumococcal Vaccine: Received Pneumococcal Vaccination: Yes Received Pneumococcal Vaccination On or After 60th Birthday: Yes Implantable Devices No devices added Hospitalization / Surgery History Type of Hospitalization/Surgery surgery toe right hip replacement 12/2018 heart cath 12/17/2021 Family and Social History Cancer: Yes - Maternal Grandparents; Diabetes: Yes - Siblings; Heart Disease: Yes - Maternal Grandparents,Paternal Grandparents,Mother,Father,Siblings; Hereditary Spherocytosis: No; Hypertension: Yes - Mother,Father,Siblings; Kidney Disease: No; Lung Disease: No; Seizures: No; Stroke: Yes - Paternal Grandparents; Thyroid Problems: No; Tuberculosis: No; Never smoker; Marital Status - Married; Alcohol Use: Moderate; Drug Use: No History; Caffeine Use: Moderate; Financial Concerns: No; Food, Clothing or Shelter Needs: No; Support System Lacking: No; Transportation Concerns: No Electronic Signature(s) Signed: 09/30/2022 1:36:39 PM By: Kalman Shan DO Entered By: Kalman Shan on 09/30/2022 13:03:18 -------------------------------------------------------------------------------- SuperBill Details Patient Name: Date of Service: [name], DO RO THY  S. 09/30/2022 Medical Record Number: [id number] Patient Account Number: [id number] Date of Birth/Sex: Treating RN: 09-18-1945 (77 y.o. [name], Lauren Primary Care Provider: Lorene Dy Other Clinician: Referring Provider: Treating Provider/Extender: [name] in Treatment: 17 Diagnosis Coding ICD-10 Codes Code Description [phone] Non-pressure chronic ulcer of other part of left lower leg with other specified severity I87.312 Chronic venous hypertension (idiopathic) with ulcer of left lower extremity J84.9 Interstitial pulmonary disease, unspecified M06.9 Rheumatoid arthritis, unspecified Facility Procedures : CPT4 Code: [id number] Description: [id number] PuraPly AM 3X4 (12sq. cm) enter 12qty Modifier: Quantity: 12 : CPT4 Code: [id number] Description: [id number] - SKIN SUB GRAFT TRNK/ARM/LEG ICD-10 Diagnosis Description L97.828 Non-pressure chronic ulcer of other part of left lower leg with other specified s I87.312 Chronic venous hypertension (idiopathic) with ulcer of left lower extremity Modifier: everity Quantity: 1 Physician Procedures : CPT4 Code Description Modifier [id number] - WC PHYS SKIN SUB GRAFT TRNK/ARM/LEG ICD-10 Diagnosis Description L97.828 Non-pressure chronic ulcer of other part of left lower leg with other specified severity I87.312 Chronic venous hypertension  (idiopathic) with ulcer of left lower extremity Quantity: 1 Electronic Signature(s) Signed: 09/30/2022 1:36:39 PM By: Delphia Grates, Arvil Persons ([id number]) PM By: Kalman Shan DO [id number] Page 11 of 11 Signed: 09/30/2022 1:36:39 Entered By: Kalman Shan on 09/30/2022 13:04:58

## 2022-10-20 ENCOUNTER — Encounter (HOSPITAL_BASED_OUTPATIENT_CLINIC_OR_DEPARTMENT_OTHER): Payer: Medicare Other | Attending: Internal Medicine | Admitting: General Surgery

## 2022-10-20 DIAGNOSIS — I5032 Chronic diastolic (congestive) heart failure: Secondary | ICD-10-CM | POA: Diagnosis not present

## 2022-10-20 DIAGNOSIS — Z9981 Dependence on supplemental oxygen: Secondary | ICD-10-CM | POA: Diagnosis not present

## 2022-10-20 DIAGNOSIS — L97822 Non-pressure chronic ulcer of other part of left lower leg with fat layer exposed: Secondary | ICD-10-CM | POA: Insufficient documentation

## 2022-10-20 DIAGNOSIS — I110 Hypertensive heart disease with heart failure: Secondary | ICD-10-CM | POA: Insufficient documentation

## 2022-10-20 DIAGNOSIS — I87312 Chronic venous hypertension (idiopathic) with ulcer of left lower extremity: Secondary | ICD-10-CM | POA: Diagnosis not present

## 2022-10-20 DIAGNOSIS — G4733 Obstructive sleep apnea (adult) (pediatric): Secondary | ICD-10-CM | POA: Insufficient documentation

## 2022-10-20 DIAGNOSIS — M069 Rheumatoid arthritis, unspecified: Secondary | ICD-10-CM | POA: Diagnosis not present

## 2022-10-20 DIAGNOSIS — J849 Interstitial pulmonary disease, unspecified: Secondary | ICD-10-CM | POA: Diagnosis not present

## 2022-10-20 NOTE — Progress Notes (Addendum)
BRUNA, BUSH ([id number]) [id number] Page 1 of 7 Visit Report for 10/20/2022 Arrival Information Details Patient Name: Date of Service: [name], [name]. 10/20/2022 11:00 A M Medical Record Number: [id number] Patient Account Number: [id number] Date of Birth/Sex: Treating RN: [birth year] (77 y.o. F) Primary Care [name]: [name] Other Clinician: Referring [name]: Treating [name]/Extender: [name] in Treatment: 32 Visit Information History Since Last Visit All ordered tests and consults were completed: No Patient Arrived: Wheel Chair Added or deleted any medications: No Arrival Time: 10:55 Any new allergies or adverse reactions: No Accompanied By: husband Had a fall or experienced change in No Transfer Assistance: None activities of daily living that may affect Patient Identification Verified: Yes risk of falls: Secondary Verification Process Completed: Yes Signs or symptoms of abuse/neglect since last visito No Patient Requires Transmission-Based Precautions: No Hospitalized since last visit: No Patient Has Alerts: Yes Implantable device outside of the clinic excluding No Patient Alerts: ABI'[name]: L: N/C 11/23 cellular tissue based products placed in the center since last visit: Pain Present Now: No Electronic Signature([name]) Signed: 10/20/2022 11:16:23 AM By: Worthy [name] Entered By: Worthy [name] on 10/20/2022 10:55:39 -------------------------------------------------------------------------------- Encounter Discharge Information Details Patient Name: Date of Service: Hailey [name], [name]. 10/20/2022 11:00 A M Medical Record Number: [id number] Patient Account Number: [id number] Date of Birth/Sex: Treating RN: 04-19-1946 (77 y.o. [name] Primary Care [name]: [name] Other Clinician: Referring [name]: Treating [name]/Extender: [name] in Treatment: 19 Encounter  Discharge Information Items Post Procedure Vitals Discharge Condition: Stable Temperature (F): 98.3 Ambulatory Status: Wheelchair Pulse (bpm): 121 Discharge Destination: Home Respiratory Rate (breaths/min): 20 Transportation: Private Auto Blood Pressure (mmHg): 184/75 Accompanied By: husband Schedule Follow-up Appointment: Yes Clinical Summary of Care: Patient Declined Electronic Signature([name]) Signed: 10/20/2022 3:43:37 PM By: [name] Entered By: Hailey Creamer on 10/20/2022 11:45:30 -------------------------------------------------------------------------------- Lower Extremity Assessment Details Patient Name: Date of Service: [name], [name]. 10/20/2022 11:00 A M Medical Record Number: [id number] Patient Account Number: [id number] Date of Birth/Sex: Treating RN: 01-24-1946 (77 y.o. [name] Primary Care [name]: [name] Other Clinician: Referring Taunja Brickner: Treating [name]/Extender: [name] in Treatment: 19 Edema Assessment Assessed: [name]: No] [name]: No] C[LeftKINLEY, REILING [id number] [[id number] Page 2 of 7] Edema: [Left: N] [Right: o] Calf Left: Right: Point of Measurement: 29 cm From Medial Instep 30.5 cm Ankle Left: Right: Point of Measurement: 9 cm From Medial Instep 23 cm Vascular Assessment Pulses: Dorsalis Pedis Palpable: [Left:Yes] Electronic Signature([name]) Signed: 10/20/2022 3:43:37 PM By: [name] Entered By: Hailey Creamer on 10/20/2022 11:17:01 -------------------------------------------------------------------------------- Multi Wound Chart Details Patient Name: Date of Service: [name], [name]. 10/20/2022 11:00 A M Medical Record Number: [id number] Patient Account Number: [id number] Date of Birth/Sex: Treating RN: 1946-03-31 (77 y.o. F) Primary Care [name]: [name] Other Clinician: Referring [name]: Treating  [name]/Extender: [name] in Treatment: 19 Vital Signs Height(in): 61 Pulse(bpm): 121 Weight(lbs): 186 Blood Pressure(mmHg): 184/75 Body Mass Index(BMI): 35.1 Temperature(F): 98.3 Respiratory Rate(breaths/min): 18 [5:Photos:] [N/A:N/A] Left, Distal, Anterior Lower Leg N/A N/A Wound Location: Gradually Appeared N/A N/A Wounding Event: Cellulitis N/A N/A Primary Etiology: Asthma, Sleep Apnea, Hypertension, N/A N/A Comorbid History: Colitis, Rheumatoid Arthritis, Osteoarthritis 04/19/2022 N/A N/A Date Acquired: 53 N/A N/A Weeks of Treatment: Open N/A N/A Wound Status: No N/A N/A Wound Recurrence: 4.6x2.2x0.1 N/A N/A Measurements L x W  x D (cm) 7.948 N/A N/A A (cm) : rea 0.795 N/A N/A Volume (cm) : 48.90% N/A N/A % Reduction in Area: 48.90% N/A N/A % Reduction in Volume: Full Thickness With Exposed Support N/A N/A Classification: Structures Medium N/A N/A Exudate Amount: Serosanguineous N/A N/A Exudate Type: red, brown N/A N/A Exudate Color: Distinct, outline attached N/A N/A Wound Margin: Large (67-100%) N/A N/A Granulation Amount: Pink, Pale N/A N/A Granulation Quality: Small (1-33%) N/A N/A Necrotic Amount: Fat Layer (Subcutaneous Tissue): Yes N/A N/A Exposed Structures: [name], [name] ([id number]) [id number] Page 3 of 7 Fascia: No Tendon: No Muscle: No Joint: No Bone: No Medium (34-66%) N/A N/A Epithelialization: Debridement - Selective/Open Wound N/A N/A Debridement: Pre-procedure Verification/Time Out 11:20 N/A N/A Taken: Lidocaine 5% topical ointment N/A N/A Pain Control: Slough N/A N/A Tissue Debrided: Non-Viable Tissue N/A N/A Level: 10.12 N/A N/A Debridement A (sq cm): rea Curette N/A N/A Instrument: Minimum N/A N/A Bleeding: Pressure N/A N/A Hemostasis A chieved: 0 N/A N/A Procedural Pain: 0 N/A N/A Post Procedural Pain: Procedure was tolerated well N/A  N/A Debridement Treatment Response: 4.6x2.2x0.1 N/A N/A Post Debridement Measurements L x W x D (cm) 0.795 N/A N/A Post Debridement Volume: (cm) Excoriation: No N/A N/A Periwound Skin Texture: Induration: No Callus: No Crepitus: No Rash: No Scarring: No Maceration: No N/A N/A Periwound Skin Moisture: Dry/Scaly: No Atrophie Blanche: No N/A N/A Periwound Skin Color: Cyanosis: No Ecchymosis: No Erythema: No Hemosiderin Staining: No Mottled: No Pallor: No Rubor: No No Abnormality N/A N/A Temperature: Cellular or Tissue Based Product N/A N/A Procedures Performed: Debridement Treatment Notes Wound #5 (Lower Leg) Wound Laterality: Left, Anterior, Distal Cleanser Wound Cleanser Discharge Instruction: Cleanse the wound with wound cleanser prior to applying a clean dressing using gauze sponges, not tissue or cotton balls. Peri-Wound Care Topical Primary Dressing Puraply #8 Discharge Instruction: applied by [name]. Adaptic and steri-strips Discharge Instruction: Used to secure Puraply AM. Leave in place. Hailey not remove. Secondary Dressing Zetuvit Plus Silicone Border Dressing 5x5 (in/in) Discharge Instruction: Apply silicone border over primary dressing as directed. Secured With Compression Wrap Compression Stockings Environmental education officer) Signed: 10/20/2022 12:22:01 PM By: Fredirick Maudlin MD FACS Entered By: Fredirick Maudlin on 10/20/2022 12:22:01 [name] ([id number]) [id number] Page 4 of 7 -------------------------------------------------------------------------------- Multi-Disciplinary Care Plan Details Patient Name: Date of Service: [name], [name]. 10/20/2022 11:00 A M Medical Record Number: [id number] Patient Account Number: [id number] Date of Birth/Sex: Treating RN: 25-Mar-1946 (77 y.o. [name] Primary Care Zeppelin Commisso: [name] Other Clinician: Referring Dasiah Hooley: Treating Jemar Paulsen/Extender: [name] in Treatment: 19 Active Inactive Pain, Acute or Chronic Nursing Diagnoses: Pain, acute or chronic: actual or potential Potential alteration in comfort, pain Goals: Patient will verbalize adequate pain control and receive pain control interventions during procedures as needed Date Initiated: 06/03/2022 Target Resolution Date: 11/18/2022 Goal Status: Active Patient/caregiver will verbalize comfort level met Date Initiated: 06/03/2022 Target Resolution Date: 11/18/2022 Goal Status: Active Interventions: Encourage patient to take pain medications as prescribed Provide education on pain management Treatment Activities: Administer pain control measures as ordered : 06/03/2022 Notes: Electronic Signature([name]) Signed: 10/20/2022 3:43:37 PM By: [name] Entered By: Hailey Creamer on 10/20/2022 11:20:09 -------------------------------------------------------------------------------- Pain Assessment Details Patient Name: Date of Service: [name], [name]. 10/20/2022 11:00 A M Medical Record Number: [id number] Patient Account Number: [id number] Date of Birth/Sex: Treating RN: 11-09-1945 (77 y.o. F) Primary Care Nathan Stallworth: [name] Other Clinician: Referring Sibel Khurana: Treating  Rande Roylance/Extender: [name] in Treatment: 19 Active Problems Location of Pain Severity and Description of Pain Patient Has Paino Yes Site Locations Rate the pain. Current Pain Level: 6 Worst Pain Level: 10 Least Pain Level: 0 Tolerable Pain Level: 3 [name], [name] ([id number]) [id number] Page 5 of 7 Pain Management and Medication Current Pain Management: Electronic Signature([name]) Signed: 10/20/2022 11:16:23 AM By: Worthy [name] Entered By: Worthy [name] on 10/20/2022 10:56:19 -------------------------------------------------------------------------------- Patient/Caregiver Education Details Patient Name: Date of  Service: Hailey Hailey App, Hailey RO Marry Guan. 4/4/2024andnbsp11:00 A M Medical Record Number: [id number] Patient Account Number: [id number] Date of Birth/Gender: Treating RN: [birth year] (77 y.o. [name] Primary Care Physician: [name] Other Clinician: Referring Physician: Treating Physician/Extender: [name] in Treatment: 19 Education Assessment Education Provided To: Patient Education Topics Provided Wound/Skin Impairment: Methods: Explain/Verbal Responses: State content correctly [employer]) Signed: 10/20/2022 3:43:37 PM By: [name] Entered By: Hailey Creamer on 10/20/2022 11:20:27 -------------------------------------------------------------------------------- Wound Assessment Details Patient Name: Date of Service: [name], [name]. 10/20/2022 11:00 A M Medical Record Number: [id number] Patient Account Number: [id number] Date of Birth/Sex: Treating RN: 11/15/1945 (77 y.o. F) Primary Care Jhana Giarratano: [name] Other Clinician: Referring Justen Fonda: Treating Ndidi Nesby/Extender: [name] in Treatment: 19 Wound Status Wound Number: 5 Primary Cellulitis Etiology: Wound Location: Left, Distal, Anterior Lower Leg Wound Open Wounding Event: Gradually Appeared Status: Date Acquired: 04/19/2022 Comorbid Asthma, Sleep Apnea, Hypertension, Colitis, Rheumatoid Weeks Of Treatment: 19 History: Arthritis, Osteoarthritis Clustered Wound: No Photos Wound Measurements [name], [name] ([id number]) Length: (cm) 4.6 Width: (cm) 2.2 Depth: (cm) 0.1 Area: (cm) 7.948 Volume: (cm) 0.795 [id number] Page 6 of 7 % Reduction in Area: 48.9% % Reduction in Volume: 48.9% Epithelialization: Medium (34-66%) Wound Description Classification: Full Thickness With Exposed Support Structures Wound Margin: Distinct, outline attached Exudate Amount: Medium Exudate Type:  Serosanguineous Exudate Color: red, brown Foul Odor After Cleansing: No Slough/Fibrino Yes Wound Bed Granulation Amount: Large (67-100%) Exposed Structure Granulation Quality: Pink, Pale Fascia Exposed: No Necrotic Amount: Small (1-33%) Fat Layer (Subcutaneous Tissue) Exposed: Yes Necrotic Quality: Adherent Slough Tendon Exposed: No Muscle Exposed: No Joint Exposed: No Bone Exposed: No Periwound Skin Texture Texture Color No Abnormalities Noted: No No Abnormalities Noted: No Callus: No Atrophie Blanche: No Crepitus: No Cyanosis: No Excoriation: No Ecchymosis: No Induration: No Erythema: No Rash: No Hemosiderin Staining: No Scarring: No Mottled: No Pallor: No Moisture Rubor: No No Abnormalities Noted: No Dry / Scaly: No Temperature / Pain Maceration: No Temperature: No Abnormality Treatment Notes Wound #5 (Lower Leg) Wound Laterality: Left, Anterior, Distal Cleanser Wound Cleanser Discharge Instruction: Cleanse the wound with wound cleanser prior to applying a clean dressing using gauze sponges, not tissue or cotton balls. Peri-Wound Care Topical Primary Dressing Puraply #8 Discharge Instruction: applied by [name]. Adaptic and steri-strips Discharge Instruction: Used to secure Puraply AM. Leave in place. Hailey not remove. Secondary Dressing Zetuvit Plus Silicone Border Dressing 5x5 (in/in) Discharge Instruction: Apply silicone border over primary dressing as directed. Secured With Compression Wrap Compression Stockings Add-Ons Electronic Signature([name]) Signed: 10/20/2022 11:16:23 AM By: Worthy [name] Entered By: Worthy [name] on 10/20/2022 11:04:17 Vitals Details -------------------------------------------------------------------------------- [name] ([id number]) [id number] Page 7 of 7 Patient Name: Date of Service: [name], [name]. 10/20/2022 11:00 A M Medical Record Number: [id number] Patient Account Number: [id number] Date  of Birth/Sex: Treating RN: 12-21-1945 (77 y.o. F) Primary Care Alenna Russell: [name] Other Clinician: Referring  [name]: Treating Riel Hirschman/Extender: [name] in Treatment: 19 Vital Signs Time Taken: 10:55 Temperature (F): 98.3 Height (in): 61 Pulse (bpm): 121 Weight (lbs): 186 Respiratory Rate (breaths/min): 18 Body Mass Index (BMI): 35.1 Blood Pressure (mmHg): 184/75 Reference Range: 80 - 120 mg / dl Electronic Signature([name]) Signed: 10/20/2022 11:16:23 AM By: Worthy [name] Entered By: Worthy [name] on 10/20/2022 10:56:04

## 2022-10-20 NOTE — Progress Notes (Signed)
[name] ([id number]) [id number] Page 1 of 11 Visit Report for 10/20/2022 Chief Complaint Document Details Patient Name: Date of Service: Hailey Courser, DO [name] 10/20/2022 11:00 A M Medical Record Number: [id number] Patient Account Number: [id number] Date of Birth/Sex: Treating RN: Mar 20, 1946 (77 y.o. F) Primary Care Provider: Lorene Dy Other Clinician: Referring Provider: Treating Provider/Extender: Bartholome Bill in Treatment: 19 Information Obtained from: Patient Chief Complaint 06/03/2022; Left lower extremity wounds Electronic Signature(s) Signed: 10/20/2022 12:22:08 PM By: Fredirick Maudlin MD FACS Entered By: Fredirick Maudlin on 10/20/2022 12:22:08 -------------------------------------------------------------------------------- Cellular or Tissue Based Product Details Patient Name: Date of Service: Hailey Courser, DO [name] 10/20/2022 11:00 A M Medical Record Number: [id number] Patient Account Number: [id number] Date of Birth/Sex: Treating RN: 01/21/1946 (77 y.o. [name] Primary Care Provider: Lorene Dy Other Clinician: Referring Provider: Treating Provider/Extender: Bartholome Bill in Treatment: 19 Cellular or Tissue Based Product Type Wound #5 Left,Distal,Anterior Lower Leg Applied to: Performed By: Physician Fredirick Maudlin, MD Cellular or Tissue Based Product Type: Puraply AM Level of Consciousness (Pre-procedure): Awake and Alert Pre-procedure Verification/Time Out Yes - 11:20 Taken: Location: trunk / arms / legs Wound Size (sq cm): 10.12 Product Size (sq cm): 12 Waste Size (sq cm): 1 Waste Reason: wound size Amount of Product Applied (sq cm): 11 Instrument Used: Forceps Lot #: [id number] Expiration Date: 08/15/2024 Fenestrated: No Reconstituted: Yes Solution Type: normal saline Solution Amount: 46ml Lot #: [id number] Solution Expiration Date: 12/15/2024 Secured:  Yes Secured With: Steri-Strips Dressing Applied: Yes Primary Dressing: adaptic, gauze Procedural Pain: 0 Post Procedural Pain: 0 Response to Treatment: Procedure was tolerated well Level of Consciousness (Post- Awake and Alert procedure): Post Procedure Diagnosis Same as Pre-procedure Notes Scribed for Dr Celine Ahr by Sharyn Creamer, RN [place], Arvil Persons ([id number]) [phone].pdf Page 2 of 11 Electronic Signature(s) Signed: 10/20/2022 12:44:03 PM By: Fredirick Maudlin MD FACS Signed: 10/20/2022 3:43:37 PM By: Sharyn Creamer RN, BSN Entered By: Sharyn Creamer on 10/20/2022 11:32:33 -------------------------------------------------------------------------------- Debridement Details Patient Name: Date of Service: Hailey Courser, DO [name] 10/20/2022 11:00 A M Medical Record Number: [id number] Patient Account Number: [id number] Date of Birth/Sex: Treating RN: 09/13/1945 (77 y.o. [name] Primary Care Provider: Lorene Dy Other Clinician: Referring Provider: Treating Provider/Extender: Bartholome Bill in Treatment: 19 Debridement Performed for Assessment: Wound #5 Left,Distal,Anterior Lower Leg Performed By: Physician Fredirick Maudlin, MD Debridement Type: Debridement Level of Consciousness (Pre-procedure): Awake and Alert Pre-procedure Verification/Time Out Yes - 11:20 Taken: Start Time: 11:25 Pain Control: Lidocaine 5% topical ointment T Area Debrided (L x W): otal 4.6 (cm) x 2.2 (cm) = 10.12 (cm) Tissue and other material debrided: Non-Viable, Slough, Slough Level: Non-Viable Tissue Debridement Description: Selective/Open Wound Instrument: Curette Bleeding: Minimum Hemostasis Achieved: Pressure Procedural Pain: 0 Post Procedural Pain: 0 Response to Treatment: Procedure was tolerated well Level of Consciousness (Post- Awake and Alert procedure): Post Debridement Measurements of Total Wound Length: (cm) 4.6 Width: (cm)  2.2 Depth: (cm) 0.1 Volume: (cm) 0.795 Character of Wound/Ulcer Post Debridement: Improved Post Procedure Diagnosis Same as Pre-procedure Notes Scribed for Dr Celine Ahr by Sharyn Creamer, RN Electronic Signature(s) Signed: 10/20/2022 12:44:03 PM By: Fredirick Maudlin MD FACS Signed: 10/20/2022 3:43:37 PM By: Sharyn Creamer RN, BSN Entered By: Sharyn Creamer on 10/20/2022 11:28:05 -------------------------------------------------------------------------------- HPI Details Patient Name: Date of Service: Hailey Courser, DO [name] 10/20/2022 11:00 A M Medical Record Number: [id number] Patient Account Number: [id number] Date of Birth/Sex: Treating  RN: [birth year] (77 y.o. F) Primary Care Provider: Lorene Dy Other Clinician: Referring Provider: Treating Provider/Extender: Bartholome Bill in Treatment: 19 History of Present Illness HPI Description: Patient presents today for initial evaluation and our clinic as a referral from the [facility] health system Department of orthopedics for evaluation and treatment of the wound to his his of the left foot at the base of the great toe. Currently the good news is the patient really does not have any significant pain which is excellent. She has been seen by Dr. Linus Salmons at [facility]: infectious disease clinic that is the regional Center for infectious disease. Subsequently the patient was cultured and positive for methicillin sensitive staph aureus. She has been on antibiotics including Cipro prior to surgery as will several days following surgery. She then had clindamycin which was changed to doxycycline. She took this for 10 days. Subsequently the pain had improved [name] ([id number]) [id number] Page 3 of 11 and there was no longer any possible draining from the wound according to the notes. The patient sedentary rate as well as C-reactive protein have returned to normal ranges. Currently per Dr.  Henreitta Leber assessment there was one dehiscence with no sign of osteomyelitis. He therefore discontinue the antibiotics with the completion of the last three days Of doxycycline. Other than that he just set her for a follow-up as needed. The patient does not have diabetes and is not a current smoker. At this time her treatment that was recommended by the surgeon was applying a Hydrocolloid dressing. Based on what I'm seeing at this point the patient actually has some Slough noted over the surface of the wound there really does not appear to be evidence of infection though I do think she does require some sharp debridement to clear away the slough and help with appropriate wound healing. She does have areas of granulation buds noted. She may be a candidate for a skin substitute as well. We will see how things do over the next period of time until follow-up. No fevers, chills, nausea, or vomiting noted at this time. 06/13/18 on evaluation today patient actually appears to be doing better in regard to her toe ulcer. She's been using the Prisma on this region and that seems to have done extremely well for her. Fortunately there does not appear to be evidence of infection at this time which is good news. She did see her surgeon they were extremely pleased with the overall appearance of her wound. 06/27/18 on evaluation today patient actually appears to be doing very well in regard to her foot ulcer. She has been tolerating the dressing changes without complication which is great news. She did see her podiatrist they felt like everything is looking very nice as well and will please. 07/05/2018 surgical wound on the left foot. She appears to be doing well. Still surface debrided to remove however in general post debridement the wound bed looks quite healthy it is come down significantly in terms of dimensions using silver collagen. The patient describes pain when her foot is elevated either in bed at night  [sometimes keeps her awake] or when is propped up on a foot rest. She basically takes analgesics for this. She does not really describe claudication with activity however her activity is very limited by interstitial lung disease. Her ABIs initially in this clinic were noncompressible. She is not a diabetic 07/20/2018; surgical wound on the left foot. Wound actually looks quite a bit better than I remember  seeing this. She is still describing pain with her leg elevated at night that she does not get when the wound is supine. She does not really describe claudication but she is very limited by her pulmonary status. We have been using silver collagen. 1/17; the patient has been followed up by orthopedics and discharge. Her arterial studies were actually quite good and should not be contributing to any pain. Slight reduction in ABIs but otherwise normal. Her wound is closed. She saw Dr. Linus Salmons of infectious disease surrounding the surgery. By review of our notes she was not felt to have osteomyelitis. 06/03/2022 Ms. Anthony Schaad is a 77 year old female with a past medical history of interstitial lung disease on chronic oxygen via nasal cannula, rheumatoid arthritis, chronic diastolic heart failure and OSA that presents to the clinic for a 1 to 27-month history of nonhealing ulcer to the left lower extremity. On 04/20/2022 she was admitted to the hospital for left lower extremity cellulitis. She required 8 days of IV vancomycin and Unasyn followed by a 14-day course of Augmentin and doxycycline. She has been using Silvadene cream to the area. She does not use compression therapy but does own compression stockings. She currently denies signs of infection. 11/27; patient presents for follow-up she is using Medihoney and Hydrofera Blue to the wound beds. She has been using her Tubigrip. She has no issues or complaints today. 12/4; patient presents for follow-up. She continues to use Medihoney and Hydrofera Blue  to the wound beds. Several attempts have been made to schedule ABIs with TBI's. We gave patient the number today to call to have these scheduled. She has been using Tubigrip. She has no issues or complaints today. 12/18; she continues to use Medihoney and Hydrofera Blue in general the surface of the wound is looking somewhat better this week and measurements are slightly smaller. She did have her arterial studies done these were noncompressible bilaterally however TBI on the right of 0.69 on the left 0.62. Waveforms on the right were triphasic on the left biphasic to triphasic. This does not suggest significant arterial disease to make healing wounds at this location difficult. 12/29; patient presents for follow-up. She has been using Medihoney and Hydrofera Blue to the wound bed. She has been using Tubigrip. 1/5; patient presents for follow-up. She is been using Medihoney and Hydrofera Blue to the wound bed along with Tubigrip. The Tubigrip was doubled at last clinic visit. There is been improvement in wound healing. 1/12; patient presents for follow-up. She has been using Medihoney and Hydrofera Blue to the wound bed along with Tubigrip. There continues to be improvement in wound healing. 1/19; patient presents for follow-up. She has been using Medihoney and Hydrofera Blue to the wound bed along with her juxta light compression daily. There is been improvement in wound measurements. Patient has no issues or complaints today. 1/25; patient presents for follow-up. She has been using blast X and collagen to the wound bed under juxta light compression daily. She has no issues or complaints today. There is been improvement in wound healing. 2/1; patient presents for follow-up. She has been using blast X and collagen to the wound bed under juxta light compression daily. She has no issues or complaints today. 2/9; patient presents for follow-up. She has been using Hydrofera Blue with blast X under  compression wrap daily. She has been approved for a skin substitute and patient would like to proceed with this. 2/15; patient presents for follow-up. PuraPly was placed in standard  fashion to the large anterior leg wound. She has been using blast X and Hydrofera Blue to the more proximal small wound. She has been using her juxta light compression daily. She has no issues or complaints today. 2/23; patient presents for follow-up. PuraPly was placed in standard fashion to the large anterior leg wound. The more proximal small wound is almost healed and they have been using blast X and Hydrofera Blue. She has been using her juxta light compression daily to the left lower extremity. There is been improvement in wound healing. 2/29; patient presents for follow-up. PuraPly was placed in standard fashion to the large anterior leg wound. The more proximal wound has healed. She is been using her juxta light compression daily. She has no issues or complaints today. 3/8; patient presents for follow-up. PuraPly has been used to the wound bed weekly patient has done well with this. She has been using her juxta light compression daily. She has no issues or complaints today. 3/15; patient presents for follow-up. We have been using PuraPly to the wound bed weekly. She has been using her juxta light compression daily. She has been doing well with this treatment. Her wound has improved. She has no issues or complaints today. 3/22; patient presents for follow-up. We have been using PuraPly to the wound bed weekly. She has been using her juxta lite compression. Overall more granulation tissue present today. Patient has no issues or complaints today. 3/29; patient presents for follow-up. We have been using PuraPly to the wound bed weekly. She has been using her juxta lite compression daily. The wound is smaller. 10/20/2022: The wound continues to contract. There is some slough accumulation in the deepest portion of the  wound. No concern for infection. Electronic Signature(s) Signed: 10/20/2022 12:23:09 PM By: Fredirick Maudlin MD [place], Arvil Persons ([id number]) PM By: Fredirick Maudlin MD FACS [phone].pdf Page 4 of 11 Signed: 10/20/2022 12:23:09 Entered By: Fredirick Maudlin on 10/20/2022 12:23:09 -------------------------------------------------------------------------------- Physical Exam Details Patient Name: Date of Service: Hailey Courser, DO [name] 10/20/2022 11:00 A M Medical Record Number: [id number] Patient Account Number: [id number] Date of Birth/Sex: Treating RN: 1945/07/25 (77 y.o. F) Primary Care Provider: Lorene Dy Other Clinician: Referring Provider: Treating Provider/Extender: Bartholome Bill in Treatment: 19 Constitutional Hypertensive, asymptomatic. Tachycardic, asymptomatic. . . no acute distress. Respiratory Normal work of breathing on supplemental oxygen. Notes 10/20/2022: The wound continues to contract. There is some slough accumulation in the deepest portion of the wound. No concern for infection. Electronic Signature(s) Signed: 10/20/2022 12:23:45 PM By: Fredirick Maudlin MD FACS Entered By: Fredirick Maudlin on 10/20/2022 12:23:45 -------------------------------------------------------------------------------- Physician Orders Details Patient Name: Date of Service: Hailey Courser, DO [name] 10/20/2022 11:00 A M Medical Record Number: [id number] Patient Account Number: [id number] Date of Birth/Sex: Treating RN: [birth year] (78 y.o. [name] Primary Care Provider: Lorene Dy Other Clinician: Referring Provider: Treating Provider/Extender: Bartholome Bill in Treatment: 19 Verbal / Phone Orders: No Diagnosis Coding ICD-10 Coding Code Description [phone] Non-pressure chronic ulcer of other part of left lower leg with other specified severity I87.312 Chronic venous hypertension (idiopathic) with ulcer of  left lower extremity J84.9 Interstitial pulmonary disease, unspecified M06.9 Rheumatoid arthritis, unspecified Follow-up Appointments ppointment in 1 week. - Dr. Heber [place] on 10/28/2022 1030 room 7 Return A ppointment in 2 weeks. - Dr Heber [place] - Rm #8 on 11/03/22 @ 11:00 Return A Anesthetic (In clinic) Topical Lidocaine 5% applied to wound bed Cellular or Tissue  Based Products Wound #5 Left,Distal,Anterior Lower Leg Cellular or Tissue Based Product Type: - 08/18/2022 insurance ran organogenesis- 100% covered. 08/26/2022 #1 Puraply AM applied. 09/01/2022 #2 Puraply AM applied. 09/09/2022 #3 Puraply AM applied to both wounds. 09/15/2022 #4 Puraply AM applied to wound. 09/23/2022 #5 Puraply AM applied to wound. 09/30/22 # 6 PUraply AM applied to wound. 10/07/22 #7 Puraply AM applied to wound. 10/14/2022 #8 Puraply AM applied to wound. 10/20/22 #9 Puraply AM applied to wound Cellular or Tissue Based Product applied to wound bed, secured with steri-strips, cover with Adaptic or Mepitel. (DO NOT REMOVE). Bathing/ Shower/ Hygiene May shower with protection but do not get wound dressing(s) wet. Protect dressing(s) with water repellant cover (for example, large plastic bag) or a cast cover and may then take shower. BRIER, MELGAR ([id number]) [id number] Page 5 of 11 Edema Control - Lymphedema / SCD / Other Elevate legs to the level of the heart or above for 30 minutes daily and/or when sitting for 3-4 times a day throughout the day. Avoid standing for long periods of time. Exercise regularly Compression stocking or Garment 20-30 mm/Hg pressure to: - start wearing the juxtalite HD apply in the morning and remove at night left leg. Non Wound Condition Protect area with: - buttock area with AandD ointment daily Wound Treatment Wound #5 - Lower Leg Wound Laterality: Left, Anterior, Distal Cleanser: Wound Cleanser (Generic) 1 x Per Week/30 Days Discharge Instructions: Cleanse the  wound with wound cleanser prior to applying a clean dressing using gauze sponges, not tissue or cotton balls. Prim Dressing: Puraply #8 1 x Per Week/30 Days ary Discharge Instructions: applied by provider. Prim Dressing: Adaptic and steri-strips 1 x Per Week/30 Days ary Discharge Instructions: Used to secure Puraply AM. Leave in place. Do not remove. Secondary Dressing: Zetuvit Plus Silicone Border Dressing 5x5 (in/in) 1 x Per Week/30 Days Discharge Instructions: Apply silicone border over primary dressing as directed. Patient Medications llergies: cefdinir, erythromycin base, lactulose, mesalamine, nitrofuran derivative, nitrofurantoin, lettuce, Sulfa (Sulfonamide Antibiotics), Adacel(Tdap A Adolesn/Adult)(PF), tetanus toxoid, adsorbed Notifications Medication Indication Start End 10/20/2022 lidocaine DOSE topical 5 % ointment - ointment topical once daily Electronic Signature(s) Signed: 10/20/2022 12:44:03 PM By: Fredirick Maudlin MD FACS Entered By: Fredirick Maudlin on 10/20/2022 12:28:46 -------------------------------------------------------------------------------- Problem List Details Patient Name: Date of Service: Hailey Courser, DO [name] 10/20/2022 11:00 A M Medical Record Number: [id number] Patient Account Number: [id number] Date of Birth/Sex: Treating RN: [birth year] (77 y.o. F) Primary Care Provider: Lorene Dy Other Clinician: Referring Provider: Treating Provider/Extender: Bartholome Bill in Treatment: 19 Active Problems ICD-10 Encounter Code Description Active Date MDM Diagnosis L97.828 Non-pressure chronic ulcer of other part of left lower leg with other specified 06/03/2022 No Yes severity I87.312 Chronic venous hypertension (idiopathic) with ulcer of left lower extremity 06/03/2022 No Yes J84.9 Interstitial pulmonary disease, unspecified 06/03/2022 No Yes M06.9 Rheumatoid arthritis, unspecified 06/03/2022 No Yes Inactive Problems JAMEYAH, COONES ([id number]) [id number] Page 6 of 11 Resolved Problems Electronic Signature(s) Signed: 10/20/2022 12:21:52 PM By: Fredirick Maudlin MD FACS Entered By: Fredirick Maudlin on 10/20/2022 12:21:51 -------------------------------------------------------------------------------- Progress Note Details Patient Name: Date of Service: Hailey Courser, DO [name] 10/20/2022 11:00 A M Medical Record Number: [id number] Patient Account Number: [id number] Date of Birth/Sex: Treating RN: 04-09-1946 (77 y.o. F) Primary Care Provider: Lorene Dy Other Clinician: Referring Provider: Treating Provider/Extender: Bartholome Bill in Treatment: 19 Subjective Chief Complaint Information obtained from Patient 06/03/2022; Left lower extremity wounds History  of Present Illness (HPI) Patient presents today for initial evaluation and our clinic as a referral from the [place] system Department of orthopedics for evaluation and treatment of the wound to his his of the left foot at the base of the great toe. Currently the good news is the patient really does not have any significant pain which is excellent. She has been seen by Dr. Linus Salmons at [facility]: infectious disease clinic that is the regional Center for infectious disease. Subsequently the patient was cultured and positive for methicillin sensitive staph aureus. She has been on antibiotics including Cipro prior to surgery as will several days following surgery. She then had clindamycin which was changed to doxycycline. She took this for 10 days. Subsequently the pain had improved and there was no longer any possible draining from the wound according to the notes. The patient sedentary rate as well as C-reactive protein have returned to normal ranges. Currently per Dr. Henreitta Leber assessment there was one dehiscence with no sign of osteomyelitis. He therefore discontinue the antibiotics with the completion  of the last three days Of doxycycline. Other than that he just set her for a follow-up as needed. The patient does not have diabetes and is not a current smoker. At this time her treatment that was recommended by the surgeon was applying a Hydrocolloid dressing. Based on what I'm seeing at this point the patient actually has some Slough noted over the surface of the wound there really does not appear to be evidence of infection though I do think she does require some sharp debridement to clear away the slough and help with appropriate wound healing. She does have areas of granulation buds noted. She may be a candidate for a skin substitute as well. We will see how things do over the next period of time until follow-up. No fevers, chills, nausea, or vomiting noted at this time. 06/13/18 on evaluation today patient actually appears to be doing better in regard to her toe ulcer. She's been using the Prisma on this region and that seems to have done extremely well for her. Fortunately there does not appear to be evidence of infection at this time which is good news. She did see her surgeon they were extremely pleased with the overall appearance of her wound. 06/27/18 on evaluation today patient actually appears to be doing very well in regard to her foot ulcer. She has been tolerating the dressing changes without complication which is great news. She did see her podiatrist they felt like everything is looking very nice as well and will please. 07/05/2018 surgical wound on the left foot. She appears to be doing well. Still surface debrided to remove however in general post debridement the wound bed looks quite healthy it is come down significantly in terms of dimensions using silver collagen. The patient describes pain when her foot is elevated either in bed at night [sometimes keeps her awake] or when is propped up on a foot rest. She basically takes analgesics for this. She does not really describe  claudication with activity however her activity is very limited by interstitial lung disease. Her ABIs initially in this clinic were noncompressible. She is not a diabetic 07/20/2018; surgical wound on the left foot. Wound actually looks quite a bit better than I remember seeing this. She is still describing pain with her leg elevated at night that she does not get when the wound is supine. She does not really describe claudication but she is very limited by  her pulmonary status. We have been using silver collagen. 1/17; the patient has been followed up by orthopedics and discharge. Her arterial studies were actually quite good and should not be contributing to any pain. Slight reduction in ABIs but otherwise normal. Her wound is closed. She saw Dr. Linus Salmons of infectious disease surrounding the surgery. By review of our notes she was not felt to have osteomyelitis. 06/03/2022 Ms. Rowyn Norquist is a 77 year old female with a past medical history of interstitial lung disease on chronic oxygen via nasal cannula, rheumatoid arthritis, chronic diastolic heart failure and OSA that presents to the clinic for a 1 to 23-month history of nonhealing ulcer to the left lower extremity. On 04/20/2022 she was admitted to the hospital for left lower extremity cellulitis. She required 8 days of IV vancomycin and Unasyn followed by a 14-day course of Augmentin and doxycycline. She has been using Silvadene cream to the area. She does not use compression therapy but does own compression stockings. She currently denies signs of infection. 11/27; patient presents for follow-up she is using Medihoney and Hydrofera Blue to the wound beds. She has been using her Tubigrip. She has no issues or complaints today. 12/4; patient presents for follow-up. She continues to use Medihoney and Hydrofera Blue to the wound beds. Several attempts have been made to schedule ABIs with TBI's. We gave patient the number today to call to have these  scheduled. She has been using Tubigrip. She has no issues or complaints today. 12/18; she continues to use Medihoney and Hydrofera Blue in general the surface of the wound is looking somewhat better this week and measurements are slightly smaller. She did have her arterial studies done these were noncompressible bilaterally however TBI on the right of 0.69 on the left 0.62. Waveforms on the right were triphasic on the left biphasic to triphasic. This does not suggest significant arterial disease to make healing wounds at this location difficult. 12/29; patient presents for follow-up. She has been using Medihoney and Hydrofera Blue to the wound bed. She has been using Tubigrip. 1/5; patient presents for follow-up. She is been using Medihoney and Hydrofera Blue to the wound bed along with Tubigrip. The Tubigrip was doubled at last clinic visit. There is been improvement in wound healing. 1/12; patient presents for follow-up. She has been using Medihoney and Hydrofera Blue to the wound bed along with Tubigrip. There continues to be improvement in wound healing. 1/19; patient presents for follow-up. She has been using Medihoney and Hydrofera Blue to the wound bed along with her juxta light compression daily. There is SHARLIE, PICKREN ([id number]) [id number] Page 7 of 11 been improvement in wound measurements. Patient has no issues or complaints today. 1/25; patient presents for follow-up. She has been using blast X and collagen to the wound bed under juxta light compression daily. She has no issues or complaints today. There is been improvement in wound healing. 2/1; patient presents for follow-up. She has been using blast X and collagen to the wound bed under juxta light compression daily. She has no issues or complaints today. 2/9; patient presents for follow-up. She has been using Hydrofera Blue with blast X under compression wrap daily. She has been approved for a skin  substitute and patient would like to proceed with this. 2/15; patient presents for follow-up. PuraPly was placed in standard fashion to the large anterior leg wound. She has been using blast X and Hydrofera Blue to the more proximal small wound. She has been using  her juxta light compression daily. She has no issues or complaints today. 2/23; patient presents for follow-up. PuraPly was placed in standard fashion to the large anterior leg wound. The more proximal small wound is almost healed and they have been using blast X and Hydrofera Blue. She has been using her juxta light compression daily to the left lower extremity. There is been improvement in wound healing. 2/29; patient presents for follow-up. PuraPly was placed in standard fashion to the large anterior leg wound. The more proximal wound has healed. She is been using her juxta light compression daily. She has no issues or complaints today. 3/8; patient presents for follow-up. PuraPly has been used to the wound bed weekly patient has done well with this. She has been using her juxta light compression daily. She has no issues or complaints today. 3/15; patient presents for follow-up. We have been using PuraPly to the wound bed weekly. She has been using her juxta light compression daily. She has been doing well with this treatment. Her wound has improved. She has no issues or complaints today. 3/22; patient presents for follow-up. We have been using PuraPly to the wound bed weekly. She has been using her juxta lite compression. Overall more granulation tissue present today. Patient has no issues or complaints today. 3/29; patient presents for follow-up. We have been using PuraPly to the wound bed weekly. She has been using her juxta lite compression daily. The wound is smaller. 10/20/2022: The wound continues to contract. There is some slough accumulation in the deepest portion of the wound. No concern for infection. Patient  History Information obtained from Patient. Family History Cancer - Maternal Grandparents, Diabetes - Siblings, Heart Disease - Maternal Grandparents,Paternal Grandparents,Mother,Father,Siblings, Hypertension - Mother,Father,Siblings, Stroke - Paternal Grandparents, No family history of Hereditary Spherocytosis, Kidney Disease, Lung Disease, Seizures, Thyroid Problems, Tuberculosis. Social History Never smoker, Marital Status - Married, Alcohol Use - Moderate, Drug Use - No History, Caffeine Use - Moderate. Medical History Eyes Denies history of Cataracts, Optic Neuritis Ear/Nose/Mouth/Throat Denies history of Chronic sinus problems/congestion Hematologic/Lymphatic Denies history of Anemia, Hemophilia, Human Immunodeficiency Virus, Lymphedema, Sickle Cell Disease Respiratory Patient has history of Asthma, Sleep Apnea Denies history of Aspiration, Chronic Obstructive Pulmonary Disease (COPD), Pneumothorax, Tuberculosis Cardiovascular Patient has history of Hypertension Denies history of Angina, Arrhythmia, Congestive Heart Failure, Coronary Artery Disease, Deep Vein Thrombosis, Hypotension, Myocardial Infarction, Peripheral Arterial Disease, Peripheral Venous Disease, Phlebitis, Vasculitis Gastrointestinal Patient has history of Colitis Denies history of Cirrhosis , Crohnoos, Hepatitis A, Hepatitis B, Hepatitis C Endocrine Denies history of Type I Diabetes, Type II Diabetes Immunological Denies history of Lupus Erythematosus, Raynaudoos, Scleroderma Integumentary (Skin) Denies history of History of Burn Musculoskeletal Patient has history of Rheumatoid Arthritis, Osteoarthritis Denies history of Gout, Osteomyelitis Neurologic Denies history of Dementia, Neuropathy, Quadriplegia, Paraplegia, Seizure Disorder Oncologic Denies history of Received Chemotherapy, Received Radiation Psychiatric Denies history of Anorexia/bulimia, Confinement Anxiety Hospitalization/Surgery History -  surgery toe. - right hip replacement 12/2018. - heart cath 12/17/2021. Medical A Surgical History Notes nd Respiratory 4L [place] 02 pulmonary fibrosis JANYLAH, HUMPHERY ([id number]) [id number] Page 8 of 11 Objective Constitutional Hypertensive, asymptomatic. Tachycardic, asymptomatic. no acute distress. Vitals Time Taken: 10:55 AM, Height: 61 in, Weight: 186 lbs, BMI: 35.1, Temperature: 98.3 F, Pulse: 121 bpm, Respiratory Rate: 18 breaths/min, Blood Pressure: 184/75 mmHg. Respiratory Normal work of breathing on supplemental oxygen. General Notes: 10/20/2022: The wound continues to contract. There is some slough accumulation in the deepest portion of the wound. No concern for  infection. Integumentary (Hair, Skin) Wound #5 status is Open. Original cause of wound was Gradually Appeared. The date acquired was: 04/19/2022. The wound has been in treatment 19 weeks. The wound is located on the [facility] Lower Leg. The wound measures 4.6cm length x 2.2cm width x 0.1cm depth; 7.948cm^2 area and 0.795cm^3 volume. There is Fat Layer (Subcutaneous Tissue) exposed. There is a medium amount of serosanguineous drainage noted. The wound margin is distinct with the outline attached to the wound base. There is large (67-100%) pink, pale granulation within the wound bed. There is a small (1-33%) amount of necrotic tissue within the wound bed including Adherent Slough. The periwound skin appearance did not exhibit: Callus, Crepitus, Excoriation, Induration, Rash, Scarring, Dry/Scaly, Maceration, Atrophie Blanche, Cyanosis, Ecchymosis, Hemosiderin Staining, Mottled, Pallor, Rubor, Erythema. Periwound temperature was noted as No Abnormality. 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 There was a Selective/Open Wound Non-Viable Tissue Debridement with a total area of 10.12 sq cm performed by Fredirick Maudlin, MD. With the following instrument(s): Curette to remove Non-Viable tissue/material. Material removed includes [facility] after achieving pain control using Lidocaine 5% topical ointment. No specimens were taken. A time out was conducted at 11:20, prior to the start of the procedure. A Minimum amount of bleeding was controlled with Pressure. The procedure was tolerated well with a pain level of 0 throughout and a pain level of 0 following the procedure. Post Debridement Measurements: 4.6cm length x 2.2cm width x 0.1cm depth; 0.795cm^3 volume. Character of Wound/Ulcer Post Debridement is improved. Post procedure Diagnosis Wound #5: Same as Pre-Procedure General Notes: Scribed for Dr Celine Ahr by Sharyn Creamer, RN. Pre-procedure diagnosis of Wound #5 is a Cellulitis located on the Left,Distal,Anterior Lower Leg. A skin graft procedure using a bioengineered skin substitute/cellular or tissue based product was performed by Fredirick Maudlin, MD with the following instrument(s): Forceps. Puraply AM was applied and secured with Steri-Strips. 11 sq cm of product was utilized and 1 sq cm was wasted due to wound size. Post Application, adaptic, gauze was applied. A Time Out was conducted at 11:20, prior to the start of the procedure. The procedure was tolerated well with a pain level of 0 throughout and a pain level of 0 following the procedure. Post procedure Diagnosis Wound #5: Same as Pre-Procedure General Notes: Scribed for Dr Celine Ahr by Sharyn Creamer, RN. Plan Follow-up Appointments: Return Appointment in 1 week. - Dr. Heber Noel on 10/28/2022 1030 room 7 Return Appointment in 2 weeks. - Dr Heber [place] - Rm #8 on 11/03/22 @  11:00 Anesthetic: (In clinic) Topical Lidocaine 5% applied to wound bed Cellular or Tissue Based Products: Wound #5 Left,Distal,Anterior Lower Leg: Cellular or Tissue Based Product Type: - 08/18/2022 insurance ran organogenesis- 100% covered. 08/26/2022 #1 Puraply AM applied. 09/01/2022 #2 Puraply AM applied. 09/09/2022 #3 Puraply AM applied to both wounds. 09/15/2022 #4 Puraply AM applied to wound. 09/23/2022 #5 Puraply AM applied to wound. 09/30/22 # 6 PUraply AM applied to wound. 10/07/22 #7 Puraply AM applied to wound. 10/14/2022 #8 Puraply AM applied to wound. 10/20/22 #9 Puraply AM applied to wound Cellular or Tissue Based Product applied to wound bed, secured with steri-strips, cover with Adaptic or Mepitel. (DO NOT REMOVE). Bathing/ Shower/ Hygiene: May shower with protection  but do not get wound dressing(s) wet. Protect dressing(s) with water repellant cover (for example, large plastic bag) or a cast cover and may then take shower. Edema Control - Lymphedema / SCD / OtherWYETTA, HOPPERT ([id number]) [id number] Page 9 of 11 Elevate legs to the level of the heart or above for 30 minutes daily and/or when sitting for 3-4 times a day throughout the day. Avoid standing for long periods of time. Exercise regularly Compression stocking or Garment 20-30 mm/Hg pressure to: - start wearing the juxtalite HD apply in the morning and remove at night left leg. Non Wound Condition: Protect area with: - buttock area with AandD ointment daily The following medication(s) was prescribed: lidocaine topical 5 % ointment ointment topical once daily was prescribed at facility WOUND #5: - Lower Leg Wound Laterality: Left, Anterior, Distal Cleanser: Wound Cleanser (Generic) 1 x Per Week/30 Days Discharge Instructions: Cleanse the wound with wound cleanser prior to applying a clean dressing using gauze sponges, not tissue or cotton balls. Prim Dressing: Puraply #8 1 x Per Week/30  Days ary Discharge Instructions: applied by provider. Prim Dressing: Adaptic and steri-strips 1 x Per Week/30 Days ary Discharge Instructions: Used to secure Puraply AM. Leave in place. Do not remove. Secondary Dressing: Zetuvit Plus Silicone Border Dressing 5x5 (in/in) 1 x Per Week/30 Days Discharge Instructions: Apply silicone border over primary dressing as directed. 10/20/2022: The wound continues to contract. There is some slough accumulation in the deepest portion of the wound. No concern for infection. I used a curette to debride the slough from her wound. PuraPly was then applied in standard fashion to her wound and secured with Adaptic and Steri-Strips. She will continue to wear her own juxta lite stocking for compression. She will follow-up with Dr. Heber [place] next week. Electronic Signature(s) Signed: 10/20/2022 12:29:21 PM By: Fredirick Maudlin MD FACS Entered By: Fredirick Maudlin on 10/20/2022 12:29:20 -------------------------------------------------------------------------------- HxROS Details Patient Name: Date of Service: Hailey Courser, DO [name] 10/20/2022 11:00 A M Medical Record Number: [id number] Patient Account Number: [id number] Date of Birth/Sex: Treating RN: 07/04/1946 (77 y.o. F) Primary Care Provider: Lorene Dy Other Clinician: Referring Provider: Treating Provider/Extender: Bartholome Bill in Treatment: 19 Information Obtained From Patient Eyes Medical History: Negative for: Cataracts; Optic Neuritis Ear/Nose/Mouth/Throat Medical History: Negative for: Chronic sinus problems/congestion Hematologic/Lymphatic Medical History: Negative for: Anemia; Hemophilia; Human Immunodeficiency Virus; Lymphedema; Sickle Cell Disease Respiratory Medical History: Positive for: Asthma; Sleep Apnea Negative for: Aspiration; Chronic Obstructive Pulmonary Disease (COPD); Pneumothorax; Tuberculosis Past Medical History Notes: 4L [place] 02 pulmonary  fibrosis Cardiovascular Medical History: Positive for: Hypertension Negative for: Angina; Arrhythmia; Congestive Heart Failure; Coronary Artery Disease; Deep Vein Thrombosis; Hypotension; Myocardial Infarction; Peripheral Arterial Disease; Peripheral Venous Disease; Phlebitis; Vasculitis Gastrointestinal Medical History: JAIMARIE, COSLEY ([id number]) [id number] Page 10 of 11 Positive for: Colitis Negative for: Cirrhosis ; Crohns; Hepatitis A; Hepatitis B; Hepatitis C Endocrine Medical History: Negative for: Type I Diabetes; Type II Diabetes Immunological Medical History: Negative for: Lupus Erythematosus; Raynauds; Scleroderma Integumentary (Skin) Medical History: Negative for: History of Burn Musculoskeletal Medical History: Positive for: Rheumatoid Arthritis; Osteoarthritis Negative for: Gout; Osteomyelitis Neurologic Medical History: Negative for: Dementia; Neuropathy; Quadriplegia; Paraplegia; Seizure Disorder Oncologic Medical History: Negative for: Received Chemotherapy; Received Radiation Psychiatric Medical History: Negative for: Anorexia/bulimia; Confinement Anxiety Immunizations Pneumococcal Vaccine: Received Pneumococcal Vaccination: Yes Received Pneumococcal Vaccination On or After 60th Birthday: Yes Implantable Devices No devices added Hospitalization / Surgery History Type of Hospitalization/Surgery surgery toe right hip replacement  12/2018 heart cath 12/17/2021 Family and Social History Cancer: Yes - Maternal Grandparents; Diabetes: Yes - Siblings; Heart Disease: Yes - Maternal Grandparents,Paternal Grandparents,Mother,Father,Siblings; Hereditary Spherocytosis: No; Hypertension: Yes - Mother,Father,Siblings; Kidney Disease: No; Lung Disease: No; Seizures: No; Stroke: Yes - Paternal Grandparents; Thyroid Problems: No; Tuberculosis: No; Never smoker; Marital Status - Married; Alcohol Use: Moderate; Drug Use: No History; Caffeine  Use: Moderate; Financial Concerns: No; Food, Clothing or Shelter Needs: No; Support System Lacking: No; Transportation Concerns: No Electronic Signature(s) Signed: 10/20/2022 12:44:03 PM By: Fredirick Maudlin MD FACS Entered By: Fredirick Maudlin on 10/20/2022 12:23:15 -------------------------------------------------------------------------------- SuperBill Details Patient Name: Date of Service: [name], DO [name] 10/20/2022 Medical Record Number: [id number] Patient Account Number: [id number] Date of Birth/Sex: Treating RN: 14-May-1946 (77 y.o. F) Primary Care Provider: Lorene Dy Other Clinician: Referring Provider: Treating Provider/Extender: Bartholome Bill in Treatment: [address] [place] ([id number]) [id number] Page 11 of 11 Diagnosis Coding ICD-10 Codes Code Description [id number] Non-pressure chronic ulcer of other part of left lower leg with other specified severity I87.312 Chronic venous hypertension (idiopathic) with ulcer of left lower extremity J84.9 Interstitial pulmonary disease, unspecified M06.9 Rheumatoid arthritis, unspecified Facility Procedures : CPT4 Code: [id number] Description: [id number] PuraPly AM 3X4 (12sq. cm) enter 12qty Modifier: Quantity: 12 : CPT4 Code: [id number] Description: [id number] - SKIN SUB GRAFT TRNK/ARM/LEG ICD-10 Diagnosis Description I87.312 Chronic venous hypertension (idiopathic) with ulcer of left lower extremity Modifier: Quantity: 1 Physician Procedures : CPT4 Code Description Modifier [id number] - WC PHYS LEVEL 3 - EST PT 25 ICD-10 Diagnosis Description L97.828 Non-pressure chronic ulcer of other part of left lower leg with other specified severity I87.312 Chronic venous hypertension (idiopathic)  with ulcer of left lower extremity J84.9 Interstitial pulmonary disease, unspecified M06.9 Rheumatoid arthritis, unspecified Quantity: 1 : [id number] - WC PHYS SKIN SUB GRAFT TRNK/ARM/LEG ICD-10  Diagnosis Description I87.312 Chronic venous hypertension (idiopathic) with ulcer of left lower extremity Quantity: 1 Electronic Signature(s) Signed: 10/20/2022 12:29:39 PM By: Fredirick Maudlin MD FACS Entered By: Fredirick Maudlin on 10/20/2022 12:29:39

## 2022-10-28 ENCOUNTER — Encounter (HOSPITAL_BASED_OUTPATIENT_CLINIC_OR_DEPARTMENT_OTHER): Payer: Medicare Other | Admitting: Internal Medicine

## 2022-10-28 DIAGNOSIS — I87312 Chronic venous hypertension (idiopathic) with ulcer of left lower extremity: Secondary | ICD-10-CM

## 2022-10-28 DIAGNOSIS — L97828 Non-pressure chronic ulcer of other part of left lower leg with other specified severity: Secondary | ICD-10-CM | POA: Diagnosis not present

## 2022-10-28 NOTE — Progress Notes (Signed)
[name] ([id number]) [id number] Page 1 of 11 Visit Report for 10/28/2022 Chief Complaint Document Details Patient Name: Date of Service: [name], [name] 10/28/2022 10:30 A M Medical Record Number: [id number] Patient Account Number: [id number] Date of Birth/Sex: Treating RN: 1945-10-29 (77 y.o. F) Primary Care Provider: Burton Apley Other Clinician: Referring Provider: Treating Provider/Extender: Cherre Blanc in Treatment: 21 Information Obtained from: Patient Chief Complaint 06/03/2022; Left lower extremity wounds Electronic Signature(s) Signed: 10/28/2022 12:30:48 PM By: Geralyn Corwin Hailey Entered By: Geralyn Corwin on 10/28/2022 12:09:47 -------------------------------------------------------------------------------- Cellular or Tissue Based Product Details Patient Name: Date of Service: [name], [name] 10/28/2022 10:30 A M Medical Record Number: [id number] Patient Account Number: [id number] Date of Birth/Sex: Treating RN: 04-05-1946 (77 y.o. [name], [name] Primary Care Provider: Burton Apley Other Clinician: Referring Provider: Treating Provider/Extender: Cherre Blanc in Treatment: 21 Cellular or Tissue Based Product Type Wound #5 Left,Distal,Anterior Lower Leg Applied to: Performed By: Physician Geralyn Corwin, Hailey Cellular or Tissue Based Product Type: Puraply AM Level of Consciousness (Pre-procedure): Awake and Alert Pre-procedure Verification/Time Out Yes - 11:17 Taken: Location: trunk / arms / legs Wound Size (sq cm): 7.38 Product Size (sq cm): 12 Waste Size (sq cm): 0 Amount of Product Applied (sq cm): 12 Instrument Used: Forceps, Scissors Lot #: [id number] Order #: 11 Expiration Date: 08/15/2024 Fenestrated: No Reconstituted: Yes Solution Type: normal saline Solution Amount: 97mL Lot #: [id number] Solution Expiration Date: 12/15/2024 Secured: Yes Secured  With: Steri-Strips, adaptic Dressing Applied: No Procedural Pain: 0 Post Procedural Pain: 0 Response to Treatment: Procedure was tolerated well Level of Consciousness (Post- Awake and Alert procedure): Post Procedure Diagnosis Same as Pre-procedure Electronic Signature(s) Signed: 10/28/2022 12:30:48 PM By: Geralyn Corwin Hailey Signed: 10/28/2022 3:38:47 PM By: Hailey Stall RN, BSN Entered By: [name] on 10/28/2022 11:22:54 [name] ([id number]) [id number] Page 2 of 11 -------------------------------------------------------------------------------- Debridement Details Patient Name: Date of Service: [name], [name] 10/28/2022 10:30 A M Medical Record Number: [id number] Patient Account Number: [id number] Date of Birth/Sex: Treating RN: 1946-01-19 (77 y.o. [name], [name] Primary Care Provider: Burton Apley Other Clinician: Referring Provider: Treating Provider/Extender: Cherre Blanc in Treatment: 21 Debridement Performed for Assessment: Wound #5 Left,Distal,Anterior Lower Leg Performed By: Physician Geralyn Corwin, Hailey Debridement Type: Debridement Level of Consciousness (Pre-procedure): Awake and Alert Pre-procedure Verification/Time Out Yes - 11:10 Taken: Start Time: 11:11 Pain Control: Lidocaine 5% topical ointment T Area Debrided (L x W): otal 4.1 (cm) x 1.8 (cm) = 7.38 (cm) Tissue and other material debrided: Non-Viable, Slough, Slough Level: Non-Viable Tissue Debridement Description: Selective/Open Wound Instrument: Curette Bleeding: None End Time: 11:17 Procedural Pain: 0 Post Procedural Pain: 0 Response to Treatment: Procedure was tolerated well Level of Consciousness (Post- Awake and Alert procedure): Post Debridement Measurements of Total Wound Length: (cm) 4.1 Width: (cm) 2.8 Depth: (cm) 0.2 Volume: (cm) 1.803 Character of Wound/Ulcer Post Debridement: Improved Post Procedure  Diagnosis Same as Pre-procedure Electronic Signature(s) Signed: 10/28/2022 12:30:48 PM By: Geralyn Corwin Hailey Signed: 10/28/2022 3:38:47 PM By: Hailey Stall RN, BSN Entered By: [name] on 10/28/2022 11:17:26 -------------------------------------------------------------------------------- HPI Details Patient Name: Date of Service: [name], [name] 10/28/2022 10:30 A M Medical Record Number: [id number] Patient Account Number: [id number] Date of Birth/Sex: Treating RN: [birth year] (77 y.o. F) Primary Care Provider: Burton Apley Other Clinician: Referring Provider: Treating Provider/Extender: Cherre Blanc in Treatment: 21  History of Present Illness HPI Description: Patient presents today for initial evaluation and our clinic as a referral from the [facility] health system Department of orthopedics for evaluation and treatment of the wound to his his of the left foot at the base of the great toe. Currently the good news is the patient really does not have any significant pain which is excellent. She has been seen by Dr. Luciana Axe at [facility]: infectious disease clinic that is the regional Center for infectious disease. Subsequently the patient was cultured and positive for methicillin sensitive staph aureus. She has been on antibiotics including Cipro prior to surgery as will several days following surgery. She then had clindamycin which was changed to doxycycline. She took this for 10 days. Subsequently the pain had improved and there was no longer any possible draining from the wound according to the notes. The patient sedentary rate as well as C-reactive protein have returned to normal ranges. Currently per Dr. Ephriam Knuckles assessment there was one dehiscence with no sign of osteomyelitis. He therefore discontinue the antibiotics with the completion of the last three days Of doxycycline. Other than that he just set her for a follow-up as needed. The patient does  not have diabetes and is not a current smoker. At this time her treatment that was recommended by the surgeon was applying a Hydrocolloid dressing. Based on what I'm seeing at this point the patient actually has some Slough noted over the surface of the wound there really does not appear to be evidence of infection though I Hailey think she does require some sharp debridement to clear away the slough and help with appropriate wound healing. She does have areas of granulation buds noted. She may be a candidate for a skin substitute as well. We will see how things Hailey over the next period of time until follow-up. No fevers, chills, nausea, or vomiting noted at this time. [name], [name] ([id number]) [id number] Page 3 of 11 06/13/18 on evaluation today patient actually appears to be doing better in regard to her toe ulcer. She's been using the Prisma on this region and that seems to have done extremely well for her. Fortunately there does not appear to be evidence of infection at this time which is good news. She did see her surgeon they were extremely pleased with the overall appearance of her wound. 06/27/18 on evaluation today patient actually appears to be doing very well in regard to her foot ulcer. She has been tolerating the dressing changes without complication which is great news. She did see her podiatrist they felt like everything is looking very nice as well and will please. 07/05/2018 surgical wound on the left foot. She appears to be doing well. Still surface debrided to remove however in general post debridement the wound bed looks quite healthy it is come down significantly in terms of dimensions using silver collagen. The patient describes pain when her foot is elevated either in bed at night [sometimes keeps her awake] or when is propped up on a foot rest. She basically takes analgesics for this. She does not really describe claudication with activity however her  activity is very limited by interstitial lung disease. Her ABIs initially in this clinic were noncompressible. She is not a diabetic 07/20/2018; surgical wound on the left foot. Wound actually looks quite a bit better than I remember seeing this. She is still describing pain with her leg elevated at night that she does not get when the wound is supine. She  does not really describe claudication but she is very limited by her pulmonary status. We have been using silver collagen. 1/17; the patient has been followed up by orthopedics and discharge. Her arterial studies were actually quite good and should not be contributing to any pain. Slight reduction in ABIs but otherwise normal. Her wound is closed. She saw Dr. Luciana Axe of infectious disease surrounding the surgery. By review of our notes she was not felt to have osteomyelitis. 06/03/2022 [name] is a 77 year old female with a past medical history of interstitial lung disease on chronic oxygen via nasal cannula, rheumatoid arthritis, chronic diastolic heart failure and OSA that presents to the clinic for a 1 to 35-month history of nonhealing ulcer to the left lower extremity. On 04/20/2022 she was admitted to the hospital for left lower extremity cellulitis. She required 8 days of IV vancomycin and Unasyn followed by a 14-day course of Augmentin and doxycycline. She has been using Silvadene cream to the area. She does not use compression therapy but does own compression stockings. She currently denies signs of infection. 11/27; patient presents for follow-up she is using Medihoney and Hydrofera Blue to the wound beds. She has been using her Tubigrip. She has no issues or complaints today. 12/4; patient presents for follow-up. She continues to use Medihoney and Hydrofera Blue to the wound beds. Several attempts have been made to schedule ABIs with TBI's. We gave patient the number today to call to have these scheduled. She has been using Tubigrip.  She has no issues or complaints today. 12/18; she continues to use Medihoney and Hydrofera Blue in general the surface of the wound is looking somewhat better this week and measurements are slightly smaller. She did have her arterial studies done these were noncompressible bilaterally however TBI on the right of 0.69 on the left 0.62. Waveforms on the right were triphasic on the left biphasic to triphasic. This does not suggest significant arterial disease to make healing wounds at this location difficult. 12/29; patient presents for follow-up. She has been using Medihoney and Hydrofera Blue to the wound bed. She has been using Tubigrip. 1/5; patient presents for follow-up. She is been using Medihoney and Hydrofera Blue to the wound bed along with Tubigrip. The Tubigrip was doubled at last clinic visit. There is been improvement in wound healing. 1/12; patient presents for follow-up. She has been using Medihoney and Hydrofera Blue to the wound bed along with Tubigrip. There continues to be improvement in wound healing. 1/19; patient presents for follow-up. She has been using Medihoney and Hydrofera Blue to the wound bed along with her juxta light compression daily. There is been improvement in wound measurements. Patient has no issues or complaints today. 1/25; patient presents for follow-up. She has been using blast X and collagen to the wound bed under juxta light compression daily. She has no issues or complaints today. There is been improvement in wound healing. 2/1; patient presents for follow-up. She has been using blast X and collagen to the wound bed under juxta light compression daily. She has no issues or complaints today. 2/9; patient presents for follow-up. She has been using Hydrofera Blue with blast X under compression wrap daily. She has been approved for a skin substitute and patient would like to proceed with this. 2/15; patient presents for follow-up. PuraPly was placed in standard  fashion to the large anterior leg wound. She has been using blast X and Hydrofera Blue to the more proximal small wound. She has  been using her juxta light compression daily. She has no issues or complaints today. 2/23; patient presents for follow-up. PuraPly was placed in standard fashion to the large anterior leg wound. The more proximal small wound is almost healed and they have been using blast X and Hydrofera Blue. She has been using her juxta light compression daily to the left lower extremity. There is been improvement in wound healing. 2/29; patient presents for follow-up. PuraPly was placed in standard fashion to the large anterior leg wound. The more proximal wound has healed. She is been using her juxta light compression daily. She has no issues or complaints today. 3/8; patient presents for follow-up. PuraPly has been used to the wound bed weekly patient has done well with this. She has been using her juxta light compression daily. She has no issues or complaints today. 3/15; patient presents for follow-up. We have been using PuraPly to the wound bed weekly. She has been using her juxta light compression daily. She has been doing well with this treatment. Her wound has improved. She has no issues or complaints today. 3/22; patient presents for follow-up. We have been using PuraPly to the wound bed weekly. She has been using her juxta lite compression. Overall more granulation tissue present today. Patient has no issues or complaints today. 3/29; patient presents for follow-up. We have been using PuraPly to the wound bed weekly. She has been using her juxta lite compression daily. The wound is smaller. 10/20/2022: The wound continues to contract. There is some slough accumulation in the deepest portion of the wound. No concern for infection. 4/12; patient presents for follow-up. We have been placing PuraPly to the wound bed weekly. There has been improvement in wound healing. Electronic  Signature(s) Signed: 10/28/2022 12:30:48 PM By: Geralyn Corwin Hailey Entered By: Geralyn Corwin on 10/28/2022 12:16:02 [name] ([id number]) [id number] Page 4 of 11 -------------------------------------------------------------------------------- Physical Exam Details Patient Name: Date of Service: [name], [name] 10/28/2022 10:30 A M Medical Record Number: [id number] Patient Account Number: [id number] Date of Birth/Sex: Treating RN: 22-Feb-1946 (77 y.o. F) Primary Care Provider: Burton Apley Other Clinician: Referring Provider: Treating Provider/Extender: Cherre Blanc in Treatment: 21 Constitutional respirations regular, non-labored and within target range for patient.. Cardiovascular 2+ dorsalis pedis/posterior tibialis pulses. Psychiatric pleasant and cooperative. Notes T the right lower extremity there is an open wound with granulation tissue and scant slough accumulation. Epithelization occurring to the edges. No signs of o infection. Good edema control. Electronic Signature(s) Signed: 10/28/2022 12:30:48 PM By: Geralyn Corwin Hailey Entered By: Geralyn Corwin on 10/28/2022 12:17:09 -------------------------------------------------------------------------------- Physician Orders Details Patient Name: Date of Service: [name], [name] 10/28/2022 10:30 A M Medical Record Number: [id number] Patient Account Number: [id number] Date of Birth/Sex: Treating RN: 06-16-1946 (77 y.o. [name], [name] Primary Care Provider: Burton Apley Other Clinician: Referring Provider: Treating Provider/Extender: Cherre Blanc in Treatment: 21 Verbal / Phone Orders: No Diagnosis Coding ICD-10 Coding Code Description [phone] Non-pressure chronic ulcer of other part of left lower leg with other specified severity I87.312 Chronic venous hypertension (idiopathic) with ulcer of left lower extremity J84.9  Interstitial pulmonary disease, unspecified M06.9 Rheumatoid arthritis, unspecified Follow-up Appointments ppointment in 1 week. - Dr Mikey Bussing - Rm #8 on 11/03/22 @ 11:00 Return A ppointment in 2 weeks. - Dr Mikey Bussing - Rm #8 on 11/10/22 @ 11:00 Return A Anesthetic (In clinic) Topical Lidocaine 5% applied to wound bed Cellular or Tissue Based  Products Wound #5 Left,Distal,Anterior Lower Leg Cellular or Tissue Based Product Type: - 08/18/2022 insurance ran organogenesis- 100% covered. 08/26/2022 #1 Puraply AM applied. 09/01/2022 #2 Puraply AM applied. 09/09/2022 #3 Puraply AM applied to both wounds. 09/15/2022 #4 Puraply AM applied to wound. 09/23/2022 #5 Puraply AM applied to wound. 09/30/22 # 6 PUraply AM applied to wound. 10/07/22 #7 Puraply AM applied to wound. 10/14/2022 #8 Puraply AM applied to wound. 10/20/22 #9 Puraply AM applied to wound 10/28/2022 #11 Puraply AM applied to wound. Cellular or Tissue Based Product applied to wound bed, secured with steri-strips, cover with Adaptic or Mepitel. (Hailey NOT REMOVE). Bathing/ Shower/ Hygiene May shower with protection but Hailey not get wound dressing(s) wet. Protect dressing(s) with water repellant cover (for example, large plastic bag) or a cast cover and may then take shower. [name], [name] ([id number]) [id number] Page 5 of 11 Edema Control - Lymphedema / SCD / Other Elevate legs to the level of the heart or above for 30 minutes daily and/or when sitting for 3-4 times a day throughout the day. Avoid standing for long periods of time. Exercise regularly Compression stocking or Garment 20-30 mm/Hg pressure to: - start wearing the juxtalite HD apply in the morning and remove at night left leg. Non Wound Condition Protect area with: - buttock area with AandD ointment daily Wound Treatment Wound #5 - Lower Leg Wound Laterality: Left, Anterior, Distal Cleanser: Wound Cleanser (Generic) 1 x Per Week/30 Days Discharge  Instructions: Cleanse the wound with wound cleanser prior to applying a clean dressing using gauze sponges, not tissue or cotton balls. Prim Dressing: Puraply #11 1 x Per Week/30 Days ary Discharge Instructions: applied by provider. Prim Dressing: Adaptic and steri-strips 1 x Per Week/30 Days ary Discharge Instructions: Used to secure Puraply AM. Leave in place. Hailey not remove. Secondary Dressing: Zetuvit Plus Silicone Border Dressing 5x5 (in/in) 1 x Per Week/30 Days Discharge Instructions: Apply silicone border over primary dressing as directed. Electronic Signature(s) Signed: 10/28/2022 12:30:48 PM By: Geralyn Corwin Hailey Entered By: Geralyn Corwin on 10/28/2022 12:17:17 -------------------------------------------------------------------------------- Problem List Details Patient Name: Date of Service: [name], [name] 10/28/2022 10:30 A M Medical Record Number: [id number] Patient Account Number: [id number] Date of Birth/Sex: Treating RN: 1945/09/30 (77 y.o. [name], [name] Primary Care Provider: Burton Apley Other Clinician: Referring Provider: Treating Provider/Extender: Cherre Blanc in Treatment: 21 Active Problems ICD-10 Encounter Code Description Active Date MDM Diagnosis [phone] Non-pressure chronic ulcer of other part of left lower leg with other specified 06/03/2022 No Yes severity I87.312 Chronic venous hypertension (idiopathic) with ulcer of left lower extremity 06/03/2022 No Yes J84.9 Interstitial pulmonary disease, unspecified 06/03/2022 No Yes M06.9 Rheumatoid arthritis, unspecified 06/03/2022 No Yes Inactive Problems Resolved Problems Electronic Signature(s) Signed: 10/28/2022 12:30:48 PM By: Geralyn Corwin Hailey Entered By: Geralyn Corwin on 10/28/2022 12:08:54 [name] ([id number]) [id number] Page 6 of 11 -------------------------------------------------------------------------------- Progress  Note Details Patient Name: Date of Service: [name], [name] 10/28/2022 10:30 A M Medical Record Number: [id number] Patient Account Number: [id number] Date of Birth/Sex: Treating RN: 05/28/1946 (77 y.o. F) Primary Care Provider: Burton Apley Other Clinician: Referring Provider: Treating Provider/Extender: Cherre Blanc in Treatment: 21 Subjective Chief Complaint Information obtained from Patient 06/03/2022; Left lower extremity wounds History of Present Illness (HPI) Patient presents today for initial evaluation and our clinic as a referral from the [facility] health system Department of orthopedics for evaluation and treatment of the wound  to his his of the left foot at the base of the great toe. Currently the good news is the patient really does not have any significant pain which is excellent. She has been seen by Dr. Luciana Axe at [facility]: infectious disease clinic that is the regional Center for infectious disease. Subsequently the patient was cultured and positive for methicillin sensitive staph aureus. She has been on antibiotics including Cipro prior to surgery as will several days following surgery. She then had clindamycin which was changed to doxycycline. She took this for 10 days. Subsequently the pain had improved and there was no longer any possible draining from the wound according to the notes. The patient sedentary rate as well as C-reactive protein have returned to normal ranges. Currently per Dr. Ephriam Knuckles assessment there was one dehiscence with no sign of osteomyelitis. He therefore discontinue the antibiotics with the completion of the last three days Of doxycycline. Other than that he just set her for a follow-up as needed. The patient does not have diabetes and is not a current smoker. At this time her treatment that was recommended by the surgeon was applying a Hydrocolloid dressing. Based on what I'm seeing at this point the patient actually  has some Slough noted over the surface of the wound there really does not appear to be evidence of infection though I Hailey think she does require some sharp debridement to clear away the slough and help with appropriate wound healing. She does have areas of granulation buds noted. She may be a candidate for a skin substitute as well. We will see how things Hailey over the next period of time until follow-up. No fevers, chills, nausea, or vomiting noted at this time. 06/13/18 on evaluation today patient actually appears to be doing better in regard to her toe ulcer. She's been using the Prisma on this region and that seems to have done extremely well for her. Fortunately there does not appear to be evidence of infection at this time which is good news. She did see her surgeon they were extremely pleased with the overall appearance of her wound. 06/27/18 on evaluation today patient actually appears to be doing very well in regard to her foot ulcer. She has been tolerating the dressing changes without complication which is great news. She did see her podiatrist they felt like everything is looking very nice as well and will please. 07/05/2018 surgical wound on the left foot. She appears to be doing well. Still surface debrided to remove however in general post debridement the wound bed looks quite healthy it is come down significantly in terms of dimensions using silver collagen. The patient describes pain when her foot is elevated either in bed at night [sometimes keeps her awake] or when is propped up on a foot rest. She basically takes analgesics for this. She does not really describe claudication with activity however her activity is very limited by interstitial lung disease. Her ABIs initially in this clinic were noncompressible. She is not a diabetic 07/20/2018; surgical wound on the left foot. Wound actually looks quite a bit better than I remember seeing this. She is still describing pain with her leg  elevated at night that she does not get when the wound is supine. She does not really describe claudication but she is very limited by her pulmonary status. We have been using silver collagen. 1/17; the patient has been followed up by orthopedics and discharge. Her arterial studies were actually quite good and should not be contributing  to any pain. Slight reduction in ABIs but otherwise normal. Her wound is closed. She saw Dr. Luciana Axe of infectious disease surrounding the surgery. By review of our notes she was not felt to have osteomyelitis. 06/03/2022 Ms. Mykel Mohl is a 77 year old female with a past medical history of interstitial lung disease on chronic oxygen via nasal cannula, rheumatoid arthritis, chronic diastolic heart failure and OSA that presents to the clinic for a 1 to 50-month history of nonhealing ulcer to the left lower extremity. On 04/20/2022 she was admitted to the hospital for left lower extremity cellulitis. She required 8 days of IV vancomycin and Unasyn followed by a 14-day course of Augmentin and doxycycline. She has been using Silvadene cream to the area. She does not use compression therapy but does own compression stockings. She currently denies signs of infection. 11/27; patient presents for follow-up she is using Medihoney and Hydrofera Blue to the wound beds. She has been using her Tubigrip. She has no issues or complaints today. 12/4; patient presents for follow-up. She continues to use Medihoney and Hydrofera Blue to the wound beds. Several attempts have been made to schedule ABIs with TBI's. We gave patient the number today to call to have these scheduled. She has been using Tubigrip. She has no issues or complaints today. 12/18; she continues to use Medihoney and Hydrofera Blue in general the surface of the wound is looking somewhat better this week and measurements are slightly smaller. She did have her arterial studies done these were noncompressible bilaterally  however TBI on the right of 0.69 on the left 0.62. Waveforms on the right were triphasic on the left biphasic to triphasic. This does not suggest significant arterial disease to make healing wounds at this location difficult. 12/29; patient presents for follow-up. She has been using Medihoney and Hydrofera Blue to the wound bed. She has been using Tubigrip. 1/5; patient presents for follow-up. She is been using Medihoney and Hydrofera Blue to the wound bed along with Tubigrip. The Tubigrip was doubled at last clinic visit. There is been improvement in wound healing. 1/12; patient presents for follow-up. She has been using Medihoney and Hydrofera Blue to the wound bed along with Tubigrip. There continues to be improvement in wound healing. 1/19; patient presents for follow-up. She has been using Medihoney and Hydrofera Blue to the wound bed along with her juxta light compression daily. There is been improvement in wound measurements. Patient has no issues or complaints today. 1/25; patient presents for follow-up. She has been using blast X and collagen to the wound bed under juxta light compression daily. She has no issues or complaints today. There is been improvement in wound healing. 2/1; patient presents for follow-up. She has been using blast X and collagen to the wound bed under juxta light compression daily. She has no issues or complaints today. 2/9; patient presents for follow-up. She has been using Hydrofera Blue with blast X under compression wrap daily. She has been approved for a skin substitute and patient would like to proceed with this. 2/15; patient presents for follow-up. PuraPly was placed in standard fashion to the large anterior leg wound. She has been using blast X and Hydrofera Blue to [name], [name] ([id number]) [id number] Page 7 of 11 the more proximal small wound. She has been using her juxta light compression daily. She has no issues or  complaints today. 2/23; patient presents for follow-up. PuraPly was placed in standard fashion to the large anterior leg wound. The more proximal  small wound is almost healed and they have been using blast X and Hydrofera Blue. She has been using her juxta light compression daily to the left lower extremity. There is been improvement in wound healing. 2/29; patient presents for follow-up. PuraPly was placed in standard fashion to the large anterior leg wound. The more proximal wound has healed. She is been using her juxta light compression daily. She has no issues or complaints today. 3/8; patient presents for follow-up. PuraPly has been used to the wound bed weekly patient has done well with this. She has been using her juxta light compression daily. She has no issues or complaints today. 3/15; patient presents for follow-up. We have been using PuraPly to the wound bed weekly. She has been using her juxta light compression daily. She has been doing well with this treatment. Her wound has improved. She has no issues or complaints today. 3/22; patient presents for follow-up. We have been using PuraPly to the wound bed weekly. She has been using her juxta lite compression. Overall more granulation tissue present today. Patient has no issues or complaints today. 3/29; patient presents for follow-up. We have been using PuraPly to the wound bed weekly. She has been using her juxta lite compression daily. The wound is smaller. 10/20/2022: The wound continues to contract. There is some slough accumulation in the deepest portion of the wound. No concern for infection. 4/12; patient presents for follow-up. We have been placing PuraPly to the wound bed weekly. There has been improvement in wound healing. Patient History Information obtained from Patient. Family History Cancer - Maternal Grandparents, Diabetes - Siblings, Heart Disease - Maternal Grandparents,Paternal Grandparents,Mother,Father,Siblings,  Hypertension - Mother,Father,Siblings, Stroke - Paternal Grandparents, No family history of Hereditary Spherocytosis, Kidney Disease, Lung Disease, Seizures, Thyroid Problems, Tuberculosis. Social History Never smoker, Marital Status - Married, Alcohol Use - Moderate, Drug Use - No History, Caffeine Use - Moderate. Medical History Eyes Denies history of Cataracts, Optic Neuritis Ear/Nose/Mouth/Throat Denies history of Chronic sinus problems/congestion Hematologic/Lymphatic Denies history of Anemia, Hemophilia, Human Immunodeficiency Virus, Lymphedema, Sickle Cell Disease Respiratory Patient has history of Asthma, Sleep Apnea Denies history of Aspiration, Chronic Obstructive Pulmonary Disease (COPD), Pneumothorax, Tuberculosis Cardiovascular Patient has history of Hypertension Denies history of Angina, Arrhythmia, Congestive Heart Failure, Coronary Artery Disease, Deep Vein Thrombosis, Hypotension, Myocardial Infarction, Peripheral Arterial Disease, Peripheral Venous Disease, Phlebitis, Vasculitis Gastrointestinal Patient has history of Colitis Denies history of Cirrhosis , Crohnoos, Hepatitis A, Hepatitis B, Hepatitis C Endocrine Denies history of Type I Diabetes, Type II Diabetes Immunological Denies history of Lupus Erythematosus, Raynaudoos, Scleroderma Integumentary (Skin) Denies history of History of Burn Musculoskeletal Patient has history of Rheumatoid Arthritis, Osteoarthritis Denies history of Gout, Osteomyelitis Neurologic Denies history of Dementia, Neuropathy, Quadriplegia, Paraplegia, Seizure Disorder Oncologic Denies history of Received Chemotherapy, Received Radiation Psychiatric Denies history of Anorexia/bulimia, Confinement Anxiety Hospitalization/Surgery History - surgery toe. - right hip replacement 12/2018. - heart cath 12/17/2021. Medical A Surgical History Notes nd Respiratory 4L [place] 02 pulmonary fibrosis Objective Constitutional respirations regular,  non-labored and within target range for patient.Marland Kitchen [name], [name] ([id number]) [id number] Page 8 of 11 Vitals Time Taken: 10:52 AM, Height: 61 in, Weight: 186 lbs, BMI: 35.1, Temperature: 98.6 F, Pulse: 106 bpm, Respiratory Rate: 17 breaths/min, Blood Pressure: 146/73 mmHg. Cardiovascular 2+ dorsalis pedis/posterior tibialis pulses. Psychiatric pleasant and cooperative. General Notes: T the right lower extremity there is an open wound with granulation tissue and scant slough accumulation. Epithelization occurring to the edges. o No signs of infection.  Good edema control. Integumentary (Hair, Skin) Wound #5 status is Open. Original cause of wound was Gradually Appeared. The date acquired was: 04/19/2022. The wound has been in treatment 21 weeks. The wound is located on the [facility] Lower Leg. The wound measures 4.1cm length x 1.8cm width x 0.2cm depth; 5.796cm^2 area and 1.159cm^3 volume. There is Fat Layer (Subcutaneous Tissue) exposed. There is no tunneling or undermining noted. There is a medium amount of serosanguineous drainage noted. The wound margin is distinct with the outline attached to the wound base. There is large (67-100%) pink, pale granulation within the wound bed. There is a small (1-33%) amount of necrotic tissue within the wound bed including Adherent Slough. The periwound skin appearance did not exhibit: Callus, Crepitus, Excoriation, Induration, Rash, Scarring, Dry/Scaly, Maceration, Atrophie Blanche, Cyanosis, Ecchymosis, Hemosiderin Staining, Mottled, Pallor, Rubor, Erythema. Periwound temperature was noted as No Abnormality. Assessment Active Problems ICD-10 Non-pressure chronic ulcer of other part of left lower leg with other specified severity Chronic venous hypertension (idiopathic) with ulcer of left lower extremity Interstitial pulmonary disease, unspecified Rheumatoid arthritis, unspecified Patient's wound has shown  improvement in size and appearance since last clinic visit. I debrided nonviable tissue. PuraPly was placed in standard fashion. Continue compression wraps daily. Follow-up in 1 week. Procedures Wound #5 Pre-procedure diagnosis of Wound #5 is a Cellulitis located on the Left,Distal,Anterior Lower Leg . There was a Selective/Open Wound Non-Viable Tissue Debridement with a total area of 7.38 sq cm performed by Geralyn Corwin, Hailey. With the following instrument(s): Curette to remove Non-Viable tissue/material. Material removed includes [facility] after achieving pain control using Lidocaine 5% topical ointment. A time out was conducted at 11:10, prior to the start of the procedure. There was no bleeding. The procedure was tolerated well with a pain level of 0 throughout and a pain level of 0 following the procedure. Post Debridement Measurements: 4.1cm length x 2.8cm width x 0.2cm depth; 1.803cm^3 volume. Character of Wound/Ulcer Post Debridement is improved. Post procedure Diagnosis Wound #5: Same as Pre-Procedure Pre-procedure diagnosis of Wound #5 is a Cellulitis located on the Left,Distal,Anterior Lower Leg. A skin graft procedure using a bioengineered skin substitute/cellular or tissue based product was performed by Geralyn Corwin, Hailey with the following instrument(s): Forceps and Scissors. Puraply AM was applied and secured with Steri-Strips and adaptic. 12 sq cm of product was utilized and 0 sq cm was wasted. Post Application, no dressing was applied. A Time Out was conducted at 11:17, prior to the start of the procedure. The procedure was tolerated well with a pain level of 0 throughout and a pain level of 0 following the procedure. Post procedure Diagnosis Wound #5: Same as Pre-Procedure . Plan Follow-up Appointments: Return Appointment in 1 week. - Dr Mikey Bussing - Rm #8 on 11/03/22 @ 11:00 Return Appointment in 2 weeks. - Dr Mikey Bussing - Rm #8 on 11/10/22 @ 11:00 Anesthetic: (In clinic) Topical  Lidocaine 5% applied to wound bed Cellular or Tissue Based Products: Wound #5 Left,Distal,Anterior Lower Leg: Cellular or Tissue Based Product Type: - 08/18/2022 insurance ran organogenesis- 100% covered. 08/26/2022 #1 Puraply AM applied. 09/01/2022 #2 Puraply AM applied. 09/09/2022 #3 Puraply AM applied to both wounds. 09/15/2022 #4 Puraply AM applied to wound. 09/23/2022 #5 Puraply AM applied to wound. 09/30/22 # 6 PUraply AM applied to wound. 10/07/22 #7 Puraply AM applied to wound. 10/14/2022 #8 Puraply AM applied to wound. 10/20/22 #9 Puraply AM applied to wound 10/28/2022 #11 Puraply AM applied to wound. Cellular or Tissue Based Product applied  to wound bed, secured with steri-strips, cover with Adaptic or Mepitel. (Hailey NOT REMOVE). Bathing/ Shower/ Hygiene: May shower with protection but Hailey not get wound dressing(s) wet. Protect dressing(s) with water repellant cover (for example, large plastic bag) or a cast cover and may then take shower. Edema Control - Lymphedema / SCD / Other: Elevate legs to the level of the heart or above for 30 minutes daily and/or when sitting for 3-4 times a day throughout the day. [name], [name] ([id number]) [id number] Page 9 of 11 Avoid standing for long periods of time. Exercise regularly Compression stocking or Garment 20-30 mm/Hg pressure to: - start wearing the juxtalite HD apply in the morning and remove at night left leg. Non Wound Condition: Protect area with: - buttock area with AandD ointment daily WOUND #5: - Lower Leg Wound Laterality: Left, Anterior, Distal Cleanser: Wound Cleanser (Generic) 1 x Per Week/30 Days Discharge Instructions: Cleanse the wound with wound cleanser prior to applying a clean dressing using gauze sponges, not tissue or cotton balls. Prim Dressing: Puraply #11 1 x Per Week/30 Days ary Discharge Instructions: applied by provider. Prim Dressing: Adaptic and steri-strips 1 x Per Week/30 Days ary Discharge  Instructions: Used to secure Puraply AM. Leave in place. Hailey not remove. Secondary Dressing: Zetuvit Plus Silicone Border Dressing 5x5 (in/in) 1 x Per Week/30 Days Discharge Instructions: Apply silicone border over primary dressing as directed. 1. In office sharp debridement 2. PuraPly placed in standard fashion 3. Compression Velcro wrap daily 4. Follow-up in 1 week Electronic Signature(s) Signed: 10/28/2022 12:30:48 PM By: Geralyn Corwin Hailey Entered By: Geralyn Corwin on 10/28/2022 12:18:16 -------------------------------------------------------------------------------- HxROS Details Patient Name: Date of Service: [name], [name] 10/28/2022 10:30 A M Medical Record Number: [id number] Patient Account Number: [id number] Date of Birth/Sex: Treating RN: Sep 05, 1945 (76 y.o. F) Primary Care Provider: Burton Apley Other Clinician: Referring Provider: Treating Provider/Extender: Cherre Blanc in Treatment: 21 Information Obtained From Patient Eyes Medical History: Negative for: Cataracts; Optic Neuritis Ear/Nose/Mouth/Throat Medical History: Negative for: Chronic sinus problems/congestion Hematologic/Lymphatic Medical History: Negative for: Anemia; Hemophilia; Human Immunodeficiency Virus; Lymphedema; Sickle Cell Disease Respiratory Medical History: Positive for: Asthma; Sleep Apnea Negative for: Aspiration; Chronic Obstructive Pulmonary Disease (COPD); Pneumothorax; Tuberculosis Past Medical History Notes: 4L [place] 02 pulmonary fibrosis Cardiovascular Medical History: Positive for: Hypertension Negative for: Angina; Arrhythmia; Congestive Heart Failure; Coronary Artery Disease; Deep Vein Thrombosis; Hypotension; Myocardial Infarction; Peripheral Arterial Disease; Peripheral Venous Disease; Phlebitis; Vasculitis Gastrointestinal Medical History: Positive for: Colitis Negative for: Cirrhosis ; Crohns; Hepatitis A; Hepatitis B; Hepatitis [name], [name] ([id number]) [id number] Page 10 of 11 Endocrine Medical History: Negative for: Type I Diabetes; Type II Diabetes Immunological Medical History: Negative for: Lupus Erythematosus; Raynauds; Scleroderma Integumentary (Skin) Medical History: Negative for: History of Burn Musculoskeletal Medical History: Positive for: Rheumatoid Arthritis; Osteoarthritis Negative for: Gout; Osteomyelitis Neurologic Medical History: Negative for: Dementia; Neuropathy; Quadriplegia; Paraplegia; Seizure Disorder Oncologic Medical History: Negative for: Received Chemotherapy; Received Radiation Psychiatric Medical History: Negative for: Anorexia/bulimia; Confinement Anxiety Immunizations Pneumococcal Vaccine: Received Pneumococcal Vaccination: Yes Received Pneumococcal Vaccination On or After 60th Birthday: Yes Implantable Devices No devices added Hospitalization / Surgery History Type of Hospitalization/Surgery surgery toe right hip replacement 12/2018 heart cath 12/17/2021 Family and Social History Cancer: Yes - Maternal Grandparents; Diabetes: Yes - Siblings; Heart Disease: Yes - Maternal Grandparents,Paternal Grandparents,Mother,Father,Siblings; Hereditary Spherocytosis: No; Hypertension: Yes - Mother,Father,Siblings; Kidney Disease: No; Lung Disease: No; Seizures: No; Stroke: Yes - Paternal Grandparents; Thyroid Problems:  No; Tuberculosis: No; Never smoker; Marital Status - Married; Alcohol Use: Moderate; Drug Use: No History; Caffeine Use: Moderate; Financial Concerns: No; Food, Clothing or Shelter Needs: No; Support System Lacking: No; Transportation Concerns: No Electronic Signature(s) Signed: 10/28/2022 12:30:48 PM By: Geralyn Corwin Hailey Entered By: Geralyn Corwin on 10/28/2022 12:16:10 -------------------------------------------------------------------------------- SuperBill Details Patient Name: Date of Service: Hailey [name], [name]  10/28/2022 Medical Record Number: [id number] Patient Account Number: [id number] Date of Birth/Sex: Treating RN: Jul 30, 1945 (77 y.o. [name], [name] Primary Care Provider: Burton Apley Other Clinician: Referring Provider: Treating Provider/Extender: Cherre Blanc in Treatment: [address] 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 cm) enter 12qty Modifier: Quantity: 12 : CPT4 Code: [id number] Description: 15271 - SKIN SUB GRAFT TRNK/ARM/LEG ICD-10 Diagnosis Description L97.828 Non-pressure chronic ulcer of other part of left lower leg with other specified s I87.312 Chronic venous hypertension (idiopathic) with ulcer of left lower extremity Modifier: everity Quantity: 1 Physician Procedures : CPT4 Code Description Modifier [id number] - WC PHYS SKIN SUB GRAFT TRNK/ARM/LEG ICD-10 Diagnosis Description L97.828 Non-pressure chronic ulcer of other part of left lower leg with other specified severity I87.312 Chronic venous hypertension  (idiopathic) with ulcer of left lower extremity Quantity: 1 Electronic Signature(s) Signed: 10/28/2022 12:30:48 PM By: Geralyn Corwin Hailey Entered By: Geralyn Corwin on 10/28/2022 12:18:25

## 2022-11-01 ENCOUNTER — Encounter (HOSPITAL_COMMUNITY): Payer: Self-pay | Admitting: Internal Medicine

## 2022-11-01 ENCOUNTER — Ambulatory Visit (HOSPITAL_COMMUNITY)
Admission: RE | Admit: 2022-11-01 | Discharge: 2022-11-01 | Disposition: A | Discharge status: Home / Self Care | Payer: Medicare Other | Source: Ambulatory Visit | Attending: Internal Medicine | Admitting: Internal Medicine

## 2022-11-01 VITALS — BP 140/70 | HR 84 | Wt 185.4 lb

## 2022-11-01 DIAGNOSIS — I272 Pulmonary hypertension, unspecified: Secondary | ICD-10-CM | POA: Diagnosis not present

## 2022-11-01 DIAGNOSIS — I5022 Chronic systolic (congestive) heart failure: Secondary | ICD-10-CM | POA: Diagnosis present

## 2022-11-01 DIAGNOSIS — I2510 Atherosclerotic heart disease of native coronary artery without angina pectoris: Secondary | ICD-10-CM | POA: Diagnosis not present

## 2022-11-01 DIAGNOSIS — I110 Hypertensive heart disease with heart failure: Secondary | ICD-10-CM | POA: Insufficient documentation

## 2022-11-01 DIAGNOSIS — J8410 Pulmonary fibrosis, unspecified: Secondary | ICD-10-CM | POA: Insufficient documentation

## 2022-11-01 DIAGNOSIS — Z7982 Long term (current) use of aspirin: Secondary | ICD-10-CM | POA: Diagnosis not present

## 2022-11-01 DIAGNOSIS — Z9981 Dependence on supplemental oxygen: Secondary | ICD-10-CM | POA: Diagnosis not present

## 2022-11-01 DIAGNOSIS — J479 Bronchiectasis, uncomplicated: Secondary | ICD-10-CM | POA: Diagnosis not present

## 2022-11-01 DIAGNOSIS — J849 Interstitial pulmonary disease, unspecified: Secondary | ICD-10-CM | POA: Diagnosis not present

## 2022-11-01 DIAGNOSIS — J398 Other specified diseases of upper respiratory tract: Secondary | ICD-10-CM | POA: Insufficient documentation

## 2022-11-01 DIAGNOSIS — R0600 Dyspnea, unspecified: Secondary | ICD-10-CM | POA: Diagnosis not present

## 2022-11-01 DIAGNOSIS — J9611 Chronic respiratory failure with hypoxia: Secondary | ICD-10-CM | POA: Diagnosis not present

## 2022-11-01 DIAGNOSIS — M069 Rheumatoid arthritis, unspecified: Secondary | ICD-10-CM | POA: Insufficient documentation

## 2022-11-01 DIAGNOSIS — Z6836 Body mass index (BMI) 36.0-36.9, adult: Secondary | ICD-10-CM | POA: Diagnosis not present

## 2022-11-01 DIAGNOSIS — Z79899 Other long term (current) drug therapy: Secondary | ICD-10-CM | POA: Diagnosis not present

## 2022-11-01 NOTE — Patient Instructions (Signed)
There has been no changes to your medications.   Your physician has requested that you have an echocardiogram. Echocardiography is a painless test that uses sound waves to create images of your heart. It provides your doctor with information about the size and shape of your heart and how well your heart's chambers and valves are working. This procedure takes approximately one hour. There are no restrictions for this procedure. Please do NOT wear cologne, perfume, aftershave, or lotions (deodorant is allowed). Please arrive 15 minutes prior to your appointment time.  Your physician recommends that you schedule a follow-up appointment in: 6 months ( October ) ** please call the office in August to arrange your follow up appointment. **  If you have any questions or concerns before your next appointment please send [place] a message through [place] or call our office at [phone].    TO LEAVE A MESSAGE FOR THE NURSE SELECT OPTION 2, PLEASE LEAVE A MESSAGE INCLUDING: YOUR NAME DATE OF BIRTH CALL BACK NUMBER REASON FOR CALL**this is important as we prioritize the call backs  YOU WILL RECEIVE A CALL BACK THE SAME DAY AS LONG AS YOU CALL BEFORE 4:00 PM  At the [facility], you and your health needs are our priority. As part of our continuing mission to provide you with exceptional heart care, we have created designated Provider Care Teams. These Care Teams include your primary Cardiologist (physician) and Advanced Practice Providers (APPs- Physician Assistants and Nurse Practitioners) who all work together to provide you with the care you need, when you need it.   You may see any of the following providers on your designated Care Team at your next follow up: Dr Arvilla Meres Dr Marca Ancona Dr. Marcos Eke, NP Robbie Lis, [place] [facility] [place], [place] Brynda Peon, NP Karle Plumber, PharmD   Please be sure to bring in all your medications  bottles to every appointment.    Thank you for choosing [facility]

## 2022-11-01 NOTE — Progress Notes (Signed)
[name], [name] ([id number]) [id number] Page 1 of 7 Visit Report for 10/28/2022 Arrival Information Details Patient Name: Date of Service: Hailey Diamond, [name] 10/28/2022 10:30 A M Medical Record Number: [id number] Patient Account Number: [id number] Date of Birth/Sex: Treating RN: [birth year] (77 y.o. [name], [name] Primary Care Nikos Anglemyer: Burton Apley Other Clinician: Referring Deepak Bless: Treating Ramzey Petrovic/Extender: Cherre Blanc in Treatment: 21 Visit Information History Since Last Visit Added or deleted any medications: No Patient Arrived: Wheel Chair Any new allergies or adverse reactions: No Arrival Time: 10:51 Had a fall or experienced change in No Accompanied By: husband activities of daily living that may affect Transfer Assistance: Manual risk of falls: Patient Identification Verified: Yes Signs or symptoms of abuse/neglect since last visito No Secondary Verification Process Completed: Yes Hospitalized since last visit: No Patient Requires Transmission-Based Precautions: No Implantable device outside of the clinic excluding No Patient Has Alerts: Yes cellular tissue based products placed in the center Patient Alerts: ABI's: L: N/C 11/23 since last visit: Has Dressing in Place as Prescribed: Yes Pain Present Now: Yes Electronic Signature(s) Signed: 11/01/2022 11:10:16 AM By: Fonnie Mu RN Entered By: Fonnie Mu on 10/28/2022 10:52:24 -------------------------------------------------------------------------------- Encounter Discharge Information Details Patient Name: Date of Service: Hailey Diamond, [name] 10/28/2022 10:30 A M Medical Record Number: [id number] Patient Account Number: [id number] Date of Birth/Sex: Treating RN: 07/03/1946 (77 y.o. [name] Primary Care Briyana Badman: Burton Apley Other Clinician: Referring Ciji Boston: Treating Radha Coggins/Extender: Cherre Blanc  in Treatment: 21 Encounter Discharge Information Items Post Procedure Vitals Discharge Condition: Stable Temperature (F): 98.6 Ambulatory Status: Wheelchair Pulse (bpm): 103 Discharge Destination: Home Respiratory Rate (breaths/min): 20 Transportation: Private Auto Blood Pressure (mmHg): 146/73 Accompanied By: husband Schedule Follow-up Appointment: Yes Clinical Summary of Care: Electronic Signature(s) Signed: 10/28/2022 3:38:47 PM By: Shawn Stall RN, BSN Entered By: Shawn Stall on 10/28/2022 11:24:09 -------------------------------------------------------------------------------- Lower Extremity Assessment Details Patient Name: Date of Service: Hailey Diamond, [name] 10/28/2022 10:30 A M Medical Record Number: [id number] Patient Account Number: [id number] Date of Birth/Sex: Treating RN: 04-07-1946 (77 y.o. [name], [name] Primary Care Libbey Duce: Burton Apley Other Clinician: Referring Aliea Bobe: Treating Terin Cragle/Extender: Cherre Blanc in Treatment: 21 Edema Assessment Assessed: [name]: Yes] Franne Forts: No] C[LeftTrilby Drummer ([id number])] [Right: [id number] Page 2 of 7] Edema: [Left: N] [Right: o] Calf Left: Right: Point of Measurement: 29 cm From Medial Instep 31 cm Ankle Left: Right: Point of Measurement: 9 cm From Medial Instep 23.5 cm Vascular Assessment Pulses: Dorsalis Pedis Palpable: [Left:Yes] Electronic Signature(s) Signed: 11/01/2022 11:10:16 AM By: Fonnie Mu RN Entered By: Fonnie Mu on 10/28/2022 10:54:48 -------------------------------------------------------------------------------- Multi Wound Chart Details Patient Name: Date of Service: Hailey [name], [name] 10/28/2022 10:30 A M Medical Record Number: [id number] Patient Account Number: [id number] Date of Birth/Sex: Treating RN: 02-11-1946 (77 y.o. F) Primary Care Captain Blucher: Burton Apley Other Clinician: Referring  Corbitt Cloke: Treating Jadis Pitter/Extender: Cherre Blanc in Treatment: 21 Vital Signs Height(in): 61 Pulse(bpm): 106 Weight(lbs): 186 Blood Pressure(mmHg): 146/73 Body Mass Index(BMI): 35.1 Temperature(F): 98.6 Respiratory Rate(breaths/min): 17 [5:Photos:] [N/A:N/A] Left, Distal, Anterior Lower Leg N/A N/A Wound Location: Gradually Appeared N/A N/A Wounding Event: Cellulitis N/A N/A Primary Etiology: Asthma, Sleep Apnea, Hypertension, N/A N/A Comorbid History: Colitis, Rheumatoid Arthritis, Osteoarthritis 04/19/2022 N/A N/A Date Acquired: 21 N/A N/A Weeks of Treatment: Open N/A N/A Wound Status: No N/A N/A Wound Recurrence: 4.1x1.8x0.2 N/A N/A Measurements L x W x  D (cm) 5.796 N/A N/A A (cm) : rea 1.159 N/A N/A Volume (cm) : 62.70% N/A N/A % Reduction in Area: 25.50% N/A N/A % Reduction in Volume: Full Thickness With Exposed Support N/A N/A Classification: Structures Medium N/A N/A Exudate Amount: Serosanguineous N/A N/A Exudate Type: red, brown N/A N/A Exudate Color: Distinct, outline attached N/A N/A Wound Margin: Large (67-100%) N/A N/A Granulation Amount: Pink, Pale N/A N/A Granulation Quality: Small (1-33%) N/A N/A Necrotic Amount: Fat Layer (Subcutaneous Tissue): Yes N/A N/A Exposed Structures: AISLIN, LOYER ([id number]) [id number] Page 3 of 7 Fascia: No Tendon: No Muscle: No Joint: No Bone: No Large (67-100%) N/A N/A Epithelialization: Debridement - Selective/Open Wound N/A N/A Debridement: Pre-procedure Verification/Time Out 11:10 N/A N/A Taken: Lidocaine 5% topical ointment N/A N/A Pain Control: Slough N/A N/A Tissue Debrided: Non-Viable Tissue N/A N/A Level: 7.38 N/A N/A Debridement A (sq cm): rea Curette N/A N/A Instrument: None N/A N/A Bleeding: 0 N/A N/A Procedural Pain: 0 N/A N/A Post Procedural Pain: Procedure was tolerated well N/A N/A Debridement Treatment  Response: 4.1x2.8x0.2 N/A N/A Post Debridement Measurements L x W x D (cm) 1.803 N/A N/A Post Debridement Volume: (cm) Excoriation: No N/A N/A Periwound Skin Texture: Induration: No Callus: No Crepitus: No Rash: No Scarring: No Maceration: No N/A N/A Periwound Skin Moisture: Dry/Scaly: No Atrophie Blanche: No N/A N/A Periwound Skin Color: Cyanosis: No Ecchymosis: No Erythema: No Hemosiderin Staining: No Mottled: No Pallor: No Rubor: No No Abnormality N/A N/A Temperature: Cellular or Tissue Based Product N/A N/A Procedures Performed: Debridement Treatment Notes Wound #5 (Lower Leg) Wound Laterality: Left, Anterior, Distal Cleanser Wound Cleanser Discharge Instruction: Cleanse the wound with wound cleanser prior to applying a clean dressing using gauze sponges, not tissue or cotton balls. Peri-Wound Care Topical Primary Dressing Puraply #11 Discharge Instruction: applied by Betina Puckett. Adaptic and steri-strips Discharge Instruction: Used to secure Puraply AM. Leave in place. Hailey not remove. Secondary Dressing Zetuvit Plus Silicone Border Dressing 5x5 (in/in) Discharge Instruction: Apply silicone border over primary dressing as directed. Secured With Compression Wrap Compression Stockings Add-Ons Electronic Signature(s) Signed: 10/28/2022 12:30:48 PM By: Geralyn Corwin Hailey Entered By: Geralyn Corwin on 10/28/2022 12:09:12 Multi-Disciplinary Care Plan Details -------------------------------------------------------------------------------- [name] ([id number]) [id number] Page 4 of 7 Patient Name: Date of Service: Hailey Diamond, [name] 10/28/2022 10:30 A M Medical Record Number: [id number] Patient Account Number: [id number] Date of Birth/Sex: Treating RN: September 09, 1945 (77 y.o. [name], [name] Primary Care Mikiah Durall: Burton Apley Other Clinician: Referring Tayjon Halladay: Treating Naasia Weilbacher/Extender: Cherre Blanc in Treatment: 21 Active Inactive Pain, Acute or Chronic Nursing Diagnoses: Pain, acute or chronic: actual or potential Potential alteration in comfort, pain Goals: Patient will verbalize adequate pain control and receive pain control interventions during procedures as needed Date Initiated: 06/03/2022 Target Resolution Date: 11/18/2022 Goal Status: Active Patient/caregiver will verbalize comfort level met Date Initiated: 06/03/2022 Target Resolution Date: 11/18/2022 Goal Status: Active Interventions: Encourage patient to take pain medications as prescribed Provide education on pain management Treatment Activities: Administer pain control measures as ordered : 06/03/2022 Notes: Electronic Signature(s) Signed: 10/28/2022 3:38:47 PM By: Shawn Stall RN, BSN Entered By: Shawn Stall on 10/28/2022 11:22:28 -------------------------------------------------------------------------------- Pain Assessment Details Patient Name: Date of Service: Hailey Diamond, [name] 10/28/2022 10:30 A M Medical Record Number: [id number] Patient Account Number: [id number] Date of Birth/Sex: Treating RN: [birth year] (77 y.o. [name], [name] Primary Care Yianna Tersigni: Burton Apley Other Clinician: Referring Miranda Garber: Treating Adin Laker/Extender: Cherre Blanc  in Treatment: 21 Active Problems Location of Pain Severity and Description of Pain Patient Has Paino Yes Site Locations Pain Location: Pain in Ulcers With Dressing Change: Yes Duration of the Pain. Constant / Intermittento Intermittent Rate the pain. Current Pain Level: 2 Worst Pain Level: 10 Least Pain Level: 0 Tolerable Pain Level: 2 Character of Pain Describe the Pain: [name], [name] ([id number]) [id number] Page 5 of 7 Pain Management and Medication Current Pain Management: Medication: No Cold Application: No Rest: No Massage: No Activity: No T.E.N.S.: No Heat  Application: No Leg drop or elevation: No Is the Current Pain Management Adequate: Adequate How does your wound impact your activities of daily livingo Sleep: No Bathing: No Appetite: No Relationship With Others: No Bladder Continence: No Emotions: No Bowel Continence: No Work: No Toileting: No Drive: No Dressing: No Hobbies: No Electronic Signature(s) Signed: 11/01/2022 11:10:16 AM By: Fonnie Mu RN Entered By: Fonnie Mu on 10/28/2022 10:53:05 -------------------------------------------------------------------------------- Patient/Caregiver Education Details Patient Name: Date of Service: Hailey [name], Hailey RO [name] 4/12/2024andnbsp10:30 A M Medical Record Number: [id number] Patient Account Number: [id number] Date of Birth/Gender: Treating RN: 03-04-1946 (77 y.o. [name] Primary Care Physician: Burton Apley Other Clinician: Referring Physician: Treating Physician/Extender: Cherre Blanc in Treatment: 21 Education Assessment Education Provided To: Patient Education Topics Provided Wound/Skin Impairment: Handouts: Caring for Your Ulcer Methods: Explain/Verbal Responses: Reinforcements needed Electronic Signature(s) Signed: 10/28/2022 3:38:47 PM By: Shawn Stall RN, BSN Entered By: Shawn Stall on 10/28/2022 11:23:11 -------------------------------------------------------------------------------- Wound Assessment Details Patient Name: Date of Service: Hailey Diamond, [name] 10/28/2022 10:30 A M Medical Record Number: [id number] Patient Account Number: [id number] Date of Birth/Sex: Treating RN: 23-Aug-1945 (77 y.o. [name], [name] Primary Care Azrael Huss: Burton Apley Other Clinician: Referring [name]: Treating Josely Moffat/Extender: Cherre Blanc in Treatment: 21 Wound Status Wound Number: 5 Primary Cellulitis Etiology: Wound Location: Left, Distal, Anterior Lower Leg Wound Open Wounding Event:  Gradually Appeared Status: Date Acquired: 04/19/2022 Comorbid Asthma, Sleep Apnea, Hypertension, Colitis, Rheumatoid Weeks Of Treatment: 21 History: Arthritis, Osteoarthritis Clustered Wound: No Photos [name], [name] ([id number]) [id number] Page 6 of 7 Wound Measurements Length: (cm) 4.1 Width: (cm) 1.8 Depth: (cm) 0.2 Area: (cm) 5.796 Volume: (cm) 1.159 % Reduction in Area: 62.7% % Reduction in Volume: 25.5% Epithelialization: Large (67-100%) Tunneling: No Undermining: No Wound Description Classification: Full Thickness With Exposed Support Structures Wound Margin: Distinct, outline attached Exudate Amount: Medium Exudate Type: Serosanguineous Exudate Color: red, brown Foul Odor After Cleansing: No Slough/Fibrino Yes Wound Bed Granulation Amount: Large (67-100%) Exposed Structure Granulation Quality: Pink, Pale Fascia Exposed: No Necrotic Amount: Small (1-33%) Fat Layer (Subcutaneous Tissue) Exposed: Yes Necrotic Quality: Adherent Slough Tendon Exposed: No Muscle Exposed: No Joint Exposed: No Bone Exposed: No Periwound Skin Texture Texture Color No Abnormalities Noted: No No Abnormalities Noted: No Callus: No Atrophie Blanche: No Crepitus: No Cyanosis: No Excoriation: No Ecchymosis: No Induration: No Erythema: No Rash: No Hemosiderin Staining: No Scarring: No Mottled: No Pallor: No Moisture Rubor: No No Abnormalities Noted: No Dry / Scaly: No Temperature / Pain Maceration: No Temperature: No Abnormality Treatment Notes Wound #5 (Lower Leg) Wound Laterality: Left, Anterior, Distal Cleanser Wound Cleanser Discharge Instruction: Cleanse the wound with wound cleanser prior to applying a clean dressing using gauze sponges, not tissue or cotton balls. Peri-Wound Care Topical Primary Dressing Puraply #11 Discharge Instruction: applied by [name]. Adaptic and steri-strips Discharge Instruction: Used to secure Puraply AM.  Leave in place. Hailey not remove.  Secondary Dressing Zetuvit Plus Silicone Border Dressing 5x5 (in/in) Discharge Instruction: Apply silicone border over primary dressing as directed. Secured With [place], [name] ([id number]) [id number] Page 7 of 7 Compression Wrap Compression Stockings Add-Ons Electronic Signature(s) Signed: 10/28/2022 3:38:47 PM By: Shawn Stall RN, BSN Signed: 11/01/2022 11:10:16 AM By: Fonnie Mu RN Entered By: Shawn Stall on 10/28/2022 11:00:43 -------------------------------------------------------------------------------- Vitals Details Patient Name: Date of Service: Hailey Diamond, [name] 10/28/2022 10:30 A M Medical Record Number: [id number] Patient Account Number: [id number] Date of Birth/Sex: Treating RN: January 19, 1946 (77 y.o. [name], [name] Primary Care Sayvion Vigen: Burton Apley Other Clinician: Referring Marylu Dudenhoeffer: Treating Yareth Macdonnell/Extender: Cherre Blanc in Treatment: 21 Vital Signs Time Taken: 10:52 Temperature (F): 98.6 Height (in): 61 Pulse (bpm): 106 Weight (lbs): 186 Respiratory Rate (breaths/min): 17 Body Mass Index (BMI): 35.1 Blood Pressure (mmHg): 146/73 Reference Range: 80 - 120 mg / dl Electronic Signature(s) Signed: 11/01/2022 11:10:16 AM By: Fonnie Mu RN Entered By: Fonnie Mu on 10/28/2022 10:52:43

## 2022-11-01 NOTE — Progress Notes (Signed)
ADVANCED HF CLINIC NOTE  Referring Physician: Dr. Nickola Major Primary Cardiologist: Dr. Okey Dupre Pulmonary: Marchelle Gearing  HPI:  [name] is a 77 year old female with RA, morbid obesity, interstitial lung disease, bronchiectasis, pulmonary fibrosis, chronic respiratory failure on home O2, diastolic HF  PFTs: 10/02/2019 FEV1 1.34 (73% predicted) FVC 1.62 (66% predicted), ratio 83 DLCO 10.42 (60% predicted).  04/01/20 FEV1 1.35 (73% predicted) FVC 1.60 (65% predicted), ratio 83 DLCO 11.37 (66% predicted).  06/07/22 FEV1 1.13 (65% predicted) FVC 1.47 (63% predicted), DLCO 6.28 (36 % predicted).  Hi-res CT 1/20: 1. Severe tracheobronchomalacia, more pronounced on today's scan. 2. Interval progression of basilar predominant fibrotic interstitial lung disease with particularly increased ground-glass component. No honeycombing. 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 3. 3vCAD  HiRes CT 04/15/20: unchanged with moderate pulmonary fibrosis   Echo 4/21: EF 55-60% Normal diastolic function. RV normal. No significant TR Echo 10/17: EF 55-60% RV normal.   Cath 3/18: LAD 50% RCA 20%  RA (mean): 13 mmHg RV (S/EDP): 38/12 mmHg PA (S/D, mean): 43/23 (32) mmHg PCWP (mean): 16 mmHg Ao sat: 94% PA sat: 70% RA sat: 75% Fick CO: 6.4 L/min Fick CI: 3.7 L/min/m^2 PVR: 2.5 Wood units  Repeat R/L cath 12/17/21  Mid LAD 40% Prox RCA 20% EF 55-65% Ao = 144/66 (101) LV = 154/9 RA = 7 RV = 62/12 PA = 63/17 (36) PCW = 11 Fick cardiac output/index = 7.2/3.8 PVR = 3.5 Ao sat = 98% PA sat = 74%, 75% SVC sat = 79% PAPi = 6.5   Here for f/u with her husband. Was admitted in 10/23 with LLE cellulitis/wound. Required a skin graft. Remains on Tyvaso per ILD clinic. Uses it 3-4x/day with 3-4 inhalations at a time. She thinks it may have helped some. But still very fatigued. Uses a walker in the house and WC outside the  house. + LE edema. Taking lasix 80 daily (recently increased). Now on 7L O2 by . No CP. No syncope or presyncope.     ROS:  Please see the history of present illness.   All other systems are personally reviewed and negative.  Past Medical History:  Diagnosis Date   Acute asthmatic bronchitis    Allergic rhinitis    Anxiety    Arthritis    Esophageal reflux    Hypertension    Interstitial lung disease dx jan 2020   Irritable bowel syndrome    Oxygen dependent    4 liters day time 6 liters at night   PONV (postoperative nausea and vomiting)    ponv likes zofran, and scopolamine patch   Pulmonary hypertension    Rheumatoid arthritis(714.0)    Sleep apnea     Current Outpatient Medications  Medication Sig Dispense Refill   albuterol (VENTOLIN HFA) 108 (90 Base) MCG/ACT inhaler USE 2 INHALATIONS BY MOUTH  EVERY 6 HOURS AS NEEDED 34 g 3   aspirin EC 81 MG tablet Take 81 mg by mouth daily.     benzonatate (TESSALON) 200 MG capsule TAKE 1 CAPSULE (200 MG TOTAL) BY MOUTH 3 (THREE) TIMES DAILY AS NEEDED FOR COUGH. 90 capsule 2   cetirizine (ZYRTEC) 10 MG tablet Take 20 mg by mouth daily.     Cholecalciferol (VITAMIN D3) 250 MCG (10000 UT) capsule Take 10,000 Units by mouth daily.     clidinium-chlordiazePOXIDE (LIBRAX) 5-2.5 MG capsule Take 1 capsule by mouth 3 (three) times daily as needed (for IBS).  colestipol (COLESTID) 1 g tablet Take 1 g by mouth daily.     dicyclomine (BENTYL) 20 MG tablet Take 20 mg by mouth 4 (four) times daily as needed (IBS).     FLUoxetine (PROZAC) 20 MG capsule Take 20 mg by mouth daily.     fluticasone (FLONASE) 50 MCG/ACT nasal spray SPRAY 2 SPRAYS INTO EACH NOSTRIL EVERY DAY 48 mL 3   furosemide (LASIX) 80 MG tablet Take 1 tablet (80 mg total) by mouth daily. 90 tablet 3   ipratropium (ATROVENT) 0.03 % nasal spray USE 1 TO 2 SPRAYS IN BOTH  NOSTRILS TWICE DAILY 90 mL 3   ipratropium-albuterol (DUONEB) 0.5-2.5 (3) MG/3ML SOLN USE 1 VIAL IN NEBULIZER  EVERY 6 HOURS AS NEEDED J45.901 810 mL 3   leflunomide (ARAVA) 20 MG tablet Take 1 tablet (20 mg total) by mouth daily.     loperamide (IMODIUM) 2 MG capsule Take 1 capsule (2 mg total) by mouth 2 (two) times daily as needed for diarrhea or loose stools. 30 capsule 0   Menthol, Topical Analgesic, (BIOFREEZE COOL THE PAIN) 4 % GEL Apply 1 Application topically 2 (two) times daily as needed (pain).     methocarbamol (ROBAXIN) 750 MG tablet Take 750 mg by mouth 2 (two) times daily as needed for muscle spasms.     montelukast (SINGULAIR) 10 MG tablet TAKE 1 TABLET BY MOUTH AT  BEDTIME 90 tablet 3   mupirocin ointment (BACTROBAN) 2 % Apply topically daily. 22 g 0   omeprazole (PRILOSEC) 40 MG capsule Take 40 mg by mouth at bedtime.      Oxycodone HCl 10 MG TABS Take 10 mg by mouth 3 (three) times daily.     OXYGEN Inhale 7 L into the lungs continuous.     Pirfenidone (ESBRIET) 801 MG TABS Take 1 tablet (801 mg total) by mouth 3 (three) times daily. 270 tablet 1   Polyethyl Glycol-Propyl Glycol (SYSTANE) 0.4-0.3 % GEL ophthalmic gel Place 2 application  into both eyes 2 (two) times daily as needed (dry eyes).     potassium chloride SA (KLOR-CON M) 20 MEQ tablet TAKE 1 TABLET BY MOUTH TWICE  DAILY 180 tablet 1   predniSONE (DELTASONE) 5 MG tablet Take 5 mg by mouth daily with breakfast.     rosuvastatin (CRESTOR) 20 MG tablet Take 1 tablet (20 mg total) by mouth daily. 90 tablet 1   traZODone (DESYREL) 100 MG tablet Take 100 mg by mouth at bedtime.     TRELEGY ELLIPTA 100-62.5-25 MCG/ACT AEPB USE 1 INHALATION BY MOUTH ONCE  DAILY AT THE SAME TIME EACH DAY 180 each 3   TYVASO DPI MAINTENANCE KIT 64 MCG POWD Inhale 1 puff into the lungs in the morning, at noon, in the evening, and at bedtime.     verapamil (CALAN-SR) 120 MG CR tablet Take 1 tablet (120 mg total) by mouth daily. 90 tablet 3   Tocilizumab (ACTEMRA IV) Inject into the vein every 30 (thirty) days. infusion (Patient not taking: Reported on  11/01/2022)     No current facility-administered medications for this encounter.    Allergies  Allergen Reactions   Cefdinir Diarrhea   Erythromycin Base Diarrhea   Lactose Dermatitis   Lactulose Diarrhea   Mesalamine Nausea Only   Methotrexate Other (See Comments)    Felt sick   Nitrofuran Derivatives     shakiness   Other Diarrhea and Other (See Comments)    Shaking uncontrollably "lettuce only" "lettuce only"  Sulfa Antibiotics Other (See Comments)    Patient can't recall reaction    Tdap [Tetanus-Diphth-Acell Pertussis]     Shaking uncontrollably   Tetanus Toxoid, Adsorbed Other (See Comments)    Shaking uncontrollable     Social History   Socioeconomic History   Marital status: Married    Spouse name: Not on file   Number of children: 2   Years of education: Not on file   Highest education level: Not on file  Occupational History   Not on file  Tobacco Use   Smoking status: Never   Smokeless tobacco: Never   Tobacco comments:    positive passive tobacco smoke exposure  Vaping Use   Vaping Use: Never used  Substance and Sexual Activity   Alcohol use: Not Currently    Alcohol/week: 7.0 standard drinks of alcohol    Types: 7 Standard drinks or equivalent per week    Comment: 1 alcohol drink each night   Drug use: No   Sexual activity: Yes  Other Topics Concern   Not on file  Social History Narrative   Exercise - at least 2 times weekly   Caffeine - 2 cups in the morning         Social Determinants of Health   Financial Resource Strain: Not on file  Food Insecurity: Not on file  Transportation Needs: Not on file  Physical Activity: Not on file  Stress: Not on file  Social Connections: Not on file  Intimate Partner Violence: Not on file   Family History  Problem Relation Age of Onset   Heart disease Mother    Arthritis Mother    Heart attack Father    Diabetes Other        sibling   Heart attack Other        sibling    Vitals:    11/01/22 1115  BP: (!) 140/70  Pulse: 84  SpO2: 95%  Weight: 84.1 kg (185 lb 6.4 oz)    Wt Readings from Last 3 Encounters:  11/01/22 84.1 kg (185 lb 6.4 oz)  09/14/22 78.9 kg (174 lb)  08/11/22 82.6 kg (182 lb)   PHYSICAL EXAM: General:  Elderly obese female. Sitting in WC on O2 HEENT: normal Neck: supple. no obvious JVD. Carotids 2+ bilat; no bruits. No lymphadenopathy or thryomegaly appreciated. Cor: PMI nondisplaced. Regular rate & rhythm. No rubs, gallops or murmurs. Lungs: Decreased throughout Abdomen: obese soft, nontender, nondistended. No bruits or masses. Good bowel sounds. Extremities: no cyanosis, clubbing, rash. Chronic venous stasis changes. Wound healing on LLE with mild surrounding edema.  Neuro: alert & orientedx3, cranial nerves grossly intact. moves all 4 extremities w/o difficulty. Affect pleasant   ECG: SR 94 bpm (personally reviewed)  ASSESSMENT & PLAN:  1. Dyspnea/Pulmonary HTN - Multifactorial. - Overall stable NYHA III-IIB - Reviewed chest CT, PFTs, cath and previous echo - Suspect major drivers are severe pulmonary fibrosis and obesity.  - Echo 4/21 EF 55-60%Normal diastolic function. Normal RV. No TR. (personally reviewed) - R/Lcath 6/23 with mild CAD and mild to moderate PAH. RA 7 PA 63/17 (36) PCW 11 Fick  7.2/3.8 PVR = 3.5 PAPi = 6.5 (pre-Tyvaso) - On Tyvaso - Will repeat echo. Can consider repeat RHC but doubt we have many other options for her with WHO Group 3 disease  2. Pulmonary fibrosis in setting of RA - Followed by [name].  - Continue Tyvaso  3. 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 - Volume status ok   4. CAD, non obstructive - Repeat coronary angio in 6/23 with stable mild CAD (mLAD 40%, prox RCA 20%) - No s/s angina On aspirin and statin - Continue to follow with Dr. Okey Dupre.  5. Obesity - Encouraged ongoing weight loss efforts   - Body mass index is 36.21 kg/m.  6.  Chronic hypoxic respiratory failure  - Continue O2 - Plan as above   Hailey Meres, MD  11:32 AM

## 2022-11-02 ENCOUNTER — Telehealth: Payer: Self-pay

## 2022-11-02 NOTE — Telephone Encounter (Signed)
While checking the [employer] portal I noticed that there was an unread message dated 10/24/22 regarding this patient that was sent to a recipient who no longer manages Esbriet patients anymore. The message states that, after multiple attempts, they have been unable to contact the pt in order to discuss ongoing eligibility to continue receiving her Esbriet free of charge through them. (NOTE--her profile on the [employer] portal has been marked as "hidden")  Called pt and LVM requesting that she follow back up with [facility], phone number was provided. Also requested that she reach out to the clinic and ask to speak to the pharmacy team should she have any further questions. Will await f/u.

## 2022-11-03 ENCOUNTER — Encounter (HOSPITAL_BASED_OUTPATIENT_CLINIC_OR_DEPARTMENT_OTHER): Payer: Medicare Other | Admitting: Internal Medicine

## 2022-11-03 DIAGNOSIS — L97828 Non-pressure chronic ulcer of other part of left lower leg with other specified severity: Secondary | ICD-10-CM

## 2022-11-03 DIAGNOSIS — I87312 Chronic venous hypertension (idiopathic) with ulcer of left lower extremity: Secondary | ICD-10-CM | POA: Diagnosis not present

## 2022-11-03 NOTE — Telephone Encounter (Signed)
Pt called the office about the Es[facility]riet and pt assistance program. Routing encounter to pharmacy team so they can reach back out to either pt or spouse.

## 2022-11-03 NOTE — Progress Notes (Signed)
[name], [name] ([id number]) [id number] Page 1 of 6 Visit Report for 09/23/2022 Arrival Information Details Patient Name: Date of Service: Hailey Diamond, DO [name] 09/23/2022 10:15 A M Medical Record Number: [id number] Patient Account Number: [id number] Date of Birth/Sex: Treating RN: 09-17-1945 (77 y.o. [name], [name] Primary Care Myda Detwiler: Burton Apley Other Clinician: Referring Jamas Jaquay: Treating Kallen Delatorre/Extender: Cherre Blanc in Treatment: 16 Visit Information History Since Last Visit All ordered tests and consults were completed: Yes Patient Arrived: Wheel Chair Added or deleted any medications: No Arrival Time: 10:17 Any new allergies or adverse reactions: No Accompanied By: husband Had a fall or experienced change in No Transfer Assistance: Manual activities of daily living that may affect Patient Requires Transmission-Based Precautions: No risk of falls: Patient Has Alerts: Yes Signs or symptoms of abuse/neglect since last visito No Patient Alerts: ABI's: L: N/C 11/23 Hospitalized since last visit: No Implantable device outside of the clinic excluding No cellular tissue based products placed in the center since last visit: Has Dressing in Place as Prescribed: Yes Pain Present Now: No Notes Received cortisone injections and antibiotic injection. Pt does not recall type. Electronic Signature(s) Signed: 09/23/2022 3:03:33 PM By: Shawn Stall RN, BSN Entered By: Shawn Stall on 09/23/2022 10:19:49 -------------------------------------------------------------------------------- Lower Extremity Assessment Details Patient Name: Date of Service: [name], DO [name] 09/23/2022 10:15 A M Medical Record Number: [id number] Patient Account Number: [id number] Date of Birth/Sex: Treating RN: 01/09/1946 (77 y.o. [name], [name] Primary Care [name]: Burton Apley Other Clinician: Referring Kashauna Celmer: Treating Tyress Loden/Extender:  Cherre Blanc in Treatment: 16 Edema Assessment Assessed: [name]: No] Franne Forts: No] Edema: [Left: N] [Right: o] Calf Left: Right: Point of Measurement: 29 cm From Medial Instep 31 cm Ankle Left: Right: Point of Measurement: 9 cm From Medial Instep 20.7 cm Vascular Assessment Pulses: Dorsalis Pedis Palpable: [Left:Yes] Electronic Signature(s) Signed: 09/23/2022 3:03:33 PM By: Shawn Stall RN, BSN Entered By: Shawn Stall on 09/23/2022 10:25:51 [name] ([id number]) [id number] Page 2 of 6 -------------------------------------------------------------------------------- Multi Wound Chart Details Patient Name: Date of Service: [name] [name] 09/23/2022 10:15 A M Medical Record Number: [id number] Patient Account Number: [id number] Date of Birth/Sex: Treating RN: [birth year] (77 y.o. F) Primary Care [name]: Burton Apley Other Clinician: Referring [name]: Treating Massey Ruhland/Extender: Cherre Blanc in Treatment: 16 Vital Signs Height(in): 61 Pulse(bpm): 82 Weight(lbs): 186 Blood Pressure(mmHg): 124/54 Body Mass Index(BMI): 35.1 Temperature(F): 98.6 Respiratory Rate(breaths/min): 20 [5:Photos:] [N/A:N/A] Left, Distal, Anterior Lower Leg N/A N/A Wound Location: Gradually Appeared N/A N/A Wounding Event: Cellulitis N/A N/A Primary Etiology: Asthma, Sleep Apnea, Hypertension, N/A N/A Comorbid History: Colitis, Rheumatoid Arthritis, Osteoarthritis 04/19/2022 N/A N/A Date Acquired: 16 N/A N/A Weeks of Treatment: Open N/A N/A Wound Status: No N/A N/A Wound Recurrence: 4.7x2.3x0.1 N/A N/A Measurements L x W x D (cm) 8.49 N/A N/A A (cm) : rea 0.849 N/A N/A Volume (cm) : 45.40% N/A N/A % Reduction in A rea: 45.40% N/A N/A % Reduction in Volume: Full Thickness With Exposed Support N/A N/A Classification: Structures Medium N/A N/A Exudate A mount: Serosanguineous N/A  N/A Exudate Type: red, brown N/A N/A Exudate Color: Distinct, outline attached N/A N/A Wound Margin: Large (67-100%) N/A N/A Granulation A mount: Pink, Pale N/A N/A Granulation Quality: Small (1-33%) N/A N/A Necrotic A mount: Fat Layer (Subcutaneous Tissue): Yes N/A N/A Exposed Structures: Fascia: No Tendon: No Muscle: No Joint: No Bone: No Medium (34-66%) N/A N/A Epithelialization: Debridement - Excisional  N/A N/A Debridement: Pre-procedure Verification/Time Out 10:35 N/A N/A Taken: Lidocaine 4% Topical Solution N/A N/A Pain Control: Subcutaneous, Slough N/A N/A Tissue Debrided: Skin/Subcutaneous Tissue N/A N/A Level: 10.81 N/A N/A Debridement A (sq cm): rea Curette N/A N/A Instrument: Minimum N/A N/A Bleeding: Pressure N/A N/A Hemostasis A chieved: 0 N/A N/A Procedural Pain: 0 N/A N/A Post Procedural Pain: Procedure was tolerated well N/A N/A Debridement Treatment Response: 4.7x2.3x0.1 N/A N/A Post Debridement Measurements L x W x D (cm) 0.849 N/A N/A Post Debridement Volume: (cm) Excoriation: No N/A N/A Periwound Skin Texture: Induration: No Callus: No Crepitus: No Hailey, [name] ([id number]) [id number].pdf Page 3 of 6 Rash: No Scarring: No Maceration: No N/A N/A Periwound Skin Moisture: Dry/Scaly: No Atrophie Blanche: No N/A N/A Periwound Skin Color: Cyanosis: No Ecchymosis: No Erythema: No Hemosiderin Staining: No Mottled: No Pallor: No Rubor: No No Abnormality N/A N/A Temperature: Cellular or Tissue Based Product N/A N/A Procedures Performed: Debridement Treatment Notes Electronic Signature(s) Signed: 09/23/2022 11:02:34 AM By: Geralyn Corwin DO Entered By: Geralyn Corwin on 09/23/2022 10:56:27 -------------------------------------------------------------------------------- Multi-Disciplinary Care Plan Details Patient Name: Date of Service: Hailey Diamond, DO [name] 09/23/2022 10:15 A M Medical Record Number:  [id number] Patient Account Number: [id number] Date of Birth/Sex: Treating RN: 12/30/1945 (78 y.o. [name], [name] Primary Care Loyce Klasen: Burton Apley Other Clinician: Referring Ellery Tash: Treating Bernardo Brayman/Extender: Cherre Blanc in Treatment: 16 Active Inactive Pain, Acute or Chronic Nursing Diagnoses: Pain, acute or chronic: actual or potential Potential alteration in comfort, pain Goals: Patient will verbalize adequate pain control and receive pain control interventions during procedures as needed Date Initiated: 06/03/2022 Target Resolution Date: 10/14/2022 Goal Status: Active Patient/caregiver will verbalize comfort level met Date Initiated: 06/03/2022 Target Resolution Date: 10/14/2022 Goal Status: Active Interventions: Encourage patient to take pain medications as prescribed Provide education on pain management Treatment Activities: Administer pain control measures as ordered : 06/03/2022 Notes: Electronic Signature(s) Signed: 09/23/2022 3:03:33 PM By: Shawn Stall RN, BSN Entered By: Shawn Stall on 09/23/2022 10:44:54 -------------------------------------------------------------------------------- Pain Assessment Details Patient Name: Date of Service: Hailey Diamond, DO [name] 09/23/2022 10:15 A M Medical Record Number: [id number] Patient Account Number: [id number] Date of Birth/Sex: Treating RN: 01-10-1946 (77 y.o. [name], [name] Primary Care Rea Reser: Burton Apley Other Clinician: Referring Erminia Mcnew: Treating Dahl Higinbotham/Extender: Cherre Blanc in Treatment: [address] [place] ([id number]) [id number] Page 4 of 6 Active Problems Location of Pain Severity and Description of Pain Patient Has Paino No Site Locations Pain Management and Medication Current Pain Management: Electronic Signature(s) Signed: 09/23/2022 3:03:33 PM By: Shawn Stall RN, BSN Entered By: Shawn Stall on 09/23/2022  10:25:05 -------------------------------------------------------------------------------- Patient/Caregiver Education Details Patient Name: Date of Service: [name], DO [name] 3/8/2024andnbsp10:15 A M Medical Record Number: [id number] Patient Account Number: [id number] Date of Birth/Gender: Treating RN: [birth year] (77 y.o. Arta Silence Primary Care Physician: Burton Apley Other Clinician: Referring Physician: Treating Physician/Extender: Cherre Blanc in Treatment: 16 Education Assessment Education Provided To: Patient Education Topics Provided Wound/Skin Impairment: Handouts: Caring for Your Ulcer Methods: Explain/Verbal Responses: Reinforcements needed Electronic Signature(s) Signed: 09/23/2022 3:03:33 PM By: Shawn Stall RN, BSN Entered By: Shawn Stall on 09/23/2022 10:45:06 -------------------------------------------------------------------------------- Wound Assessment Details Patient Name: Date of Service: Hailey Diamond, DO [name] 09/23/2022 10:15 A M Medical Record Number: [id number] Patient Account Number: [id number] Date of Birth/Sex: Treating RN: 08/21/1945 (77 y.o. Arta Silence Primary Care Seirra Kos: Burton Apley Other Clinician: Referring Yudit Modesitt:  Treating Oliana Gowens/Extender: Cherre Blanc in Treatment: [address] [place] ([id number]) [id number] Page 5 of 6 Wound Status Wound Number: 5 Primary Cellulitis Etiology: Wound Location: Left, Distal, Anterior Lower Leg Wound Open Wounding Event: Gradually Appeared Status: Date Acquired: 04/19/2022 Comorbid Asthma, Sleep Apnea, Hypertension, Colitis, Rheumatoid Weeks Of Treatment: 16 History: Arthritis, Osteoarthritis Clustered Wound: No Photos Wound Measurements Length: (cm) Width: (cm) Depth: (cm) Area: (cm) Volume: (cm) 4.7 % Reduction in Area: 45.4% 2.3 % Reduction in Volume: 45.4% 0.1 Epithelialization: Medium  (34-66%) 8.49 Tunneling: No 0.849 Undermining: No Wound Description Classification: Full Thickness With Exposed Suppor Wound Margin: Distinct, outline attached Exudate Amount: Medium Exudate Type: Serosanguineous Exudate Color: red, brown t Structures Foul Odor After Cleansing: No Slough/Fibrino Yes Wound Bed Granulation Amount: Large (67-100%) Exposed Structure Granulation Quality: Pink, Pale Fascia Exposed: No Necrotic Amount: Small (1-33%) Fat Layer (Subcutaneous Tissue) Exposed: Yes Necrotic Quality: Adherent Slough Tendon Exposed: No Muscle Exposed: No Joint Exposed: No Bone Exposed: No Periwound Skin Texture Texture Color No Abnormalities Noted: No No Abnormalities Noted: No Callus: No Atrophie Blanche: No Crepitus: No Cyanosis: No Excoriation: No Ecchymosis: No Induration: No Erythema: No Rash: No Hemosiderin Staining: No Scarring: No Mottled: No Pallor: No Moisture Rubor: No No Abnormalities Noted: No Dry / Scaly: No Temperature / Pain Maceration: No Temperature: No Abnormality Electronic Signature(s) Signed: 09/23/2022 3:03:33 PM By: Shawn Stall RN, BSN Signed: 11/03/2022 1:55:51 PM By: Brenton Grills Entered By: Brenton Grills on 09/23/2022 10:35:26 -------------------------------------------------------------------------------- Vitals Details Patient Name: Date of Service: Hailey Diamond, DO [name] 09/23/2022 10:15 A M Medical Record Number: [id number] Patient Account Number: [id number] Date of Birth/Sex: Treating RN: [birth year] (77 y.o. Arta Silence Primary Care Samah Lapiana: Burton Apley Other Clinician: Foy [name] ([id number]) [id number] Page 6 of 6 Referring Laureano Hetzer: Treating Imri Lor/Extender: Cherre Blanc in Treatment: 16 Vital Signs Time Taken: 10:20 Temperature (F): 98.6 Height (in): 61 Pulse (bpm): 82 Weight (lbs): 186 Respiratory Rate (breaths/min): 20 Body Mass Index (BMI):  35.1 Blood Pressure (mmHg): 124/54 Reference Range: 80 - 120 mg / dl Electronic Signature(s) Signed: 09/23/2022 3:03:33 PM By: Shawn Stall RN, BSN Entered By: Shawn Stall on 09/23/2022 10:24:53

## 2022-11-03 NOTE — Progress Notes (Signed)
HAJRA, PORT ([id number]) [id number] Page 1 of 6 Visit Report for 10/07/2022 Arrival Information Details Patient Name: Date of Service: Hailey Diamond, DO [name] 10/07/2022 10:15 A M Medical Record Number: [id number] Patient Account Number: [id number] Date of Birth/Sex: Treating RN: Nov 29, 1945 (77 y.o. F) Primary Care Laurina Fischl: Burton Apley Other Clinician: Referring Seanpatrick Maisano: Treating Marius Betts/Extender: Cherre Blanc in Treatment: 18 Visit Information History Since Last Visit Added or deleted any medications: No Patient Arrived: Wheel Chair Any new allergies or adverse reactions: No Arrival Time: 10:11 Had a fall or experienced change in No Accompanied By: husband activities of daily living that may affect Transfer Assistance: None risk of falls: Patient Identification Verified: Yes Signs or symptoms of abuse/neglect since last visito No Secondary Verification Process Completed: Yes Hospitalized since last visit: No Patient Requires Transmission-Based Precautions: No Implantable device outside of the clinic excluding No Patient Has Alerts: Yes cellular tissue based products placed in the center Patient Alerts: ABI's: L: N/C 11/23 since last visit: Has Dressing in Place as Prescribed: Yes Pain Present Now: No Electronic Signature(s) Signed: 10/10/2022 4:16:21 PM By: Thayer [name] Entered By: Thayer [name] on 10/07/2022 10:12:50 -------------------------------------------------------------------------------- Encounter Discharge Information Details Patient Name: Date of Service: Hailey Diamond, DO [name] 10/07/2022 10:15 A M Medical Record Number: [id number] Patient Account Number: [id number] Date of Birth/Sex: Treating RN: 26-Mar-1946 (77 y.o. F) Primary Care Danaja Lasota: Burton Apley Other Clinician: Referring Avree Szczygiel: Treating Maurice Ramseur/Extender: Cherre Blanc in Treatment: 18 Encounter Discharge  Information Items Post Procedure Vitals Discharge Condition: Stable Temperature (F): 98.1 Ambulatory Status: Cane Pulse (bpm): 85 Discharge Destination: Home Respiratory Rate (breaths/min): 18 Transportation: Private Auto Blood Pressure (mmHg): 127/78 Accompanied By: spouse Schedule Follow-up Appointment: Yes Clinical Summary of Care: Patient Declined Electronic Signature(s) Signed: 10/10/2022 4:16:21 PM By: Thayer [name] Entered By: Thayer [name] on 10/07/2022 10:46:22 -------------------------------------------------------------------------------- Lower Extremity Assessment Details Patient Name: Date of Service: Hailey Diamond, DO [name] 10/07/2022 10:15 A M Medical Record Number: [id number] Patient Account Number: [id number] Date of Birth/Sex: Treating RN: September 15, 1945 (77 y.o. F) Primary Care Chava Dulac: Burton Apley Other Clinician: Referring Georgi Navarrete: Treating Anila Bojarski/Extender: Cherre Blanc in Treatment: 18 Edema Assessment Assessed: [name]: No] Franne Forts: No] C[LeftSHARIFA, BUCHOLZ ([id number])] [Right: [id number] Page 2 of 6] Edema: [Left: N] [Right: o] Calf Left: Right: Point of Measurement: 29 cm From Medial Instep 29.8 cm Ankle Left: Right: Point of Measurement: 9 cm From Medial Instep 23 cm Vascular Assessment Pulses: Dorsalis Pedis Palpable: [Left:Yes] Electronic Signature(s) Signed: 10/10/2022 4:16:21 PM By: Thayer [name] Entered By: Thayer [name] on 10/07/2022 10:21:48 -------------------------------------------------------------------------------- Multi Wound Chart Details Patient Name: Date of Service: Hailey Diamond, DO [name] 10/07/2022 10:15 A M Medical Record Number: [id number] Patient Account Number: [id number] Date of Birth/Sex: Treating RN: 1946/06/22 (77 y.o. F) Primary Care Anhar Mcdermott: Burton Apley Other Clinician: Referring Madisan Bice: Treating Aliyanna Wassmer/Extender: Cherre Blanc  in Treatment: 18 Vital Signs Height(in): 61 Pulse(bpm): 85 Weight(lbs): 186 Blood Pressure(mmHg): 127/78 Body Mass Index(BMI): 35.1 Temperature(F): 98.1 Respiratory Rate(breaths/min): 18 [5:Photos:] [N/A:N/A] Left, Distal, Anterior Lower Leg N/A N/A Wound Location: Gradually Appeared N/A N/A Wounding Event: Cellulitis N/A N/A Primary Etiology: Asthma, Sleep Apnea, Hypertension, N/A N/A Comorbid History: Colitis, Rheumatoid Arthritis, Osteoarthritis 04/19/2022 N/A N/A Date Acquired: 18 N/A N/A Weeks of Treatment: Open N/A N/A Wound Status: No N/A N/A Wound Recurrence: 4.6x2.4x0.1 N/A N/A Measurements L x W x D (cm) 8.671 N/A N/A A (cm) :  rea 37.867 N/A N/A Volume (cm) : 44.20% N/A N/A % Reduction in Area: 44.20% N/A N/A % Reduction in Volume: Full Thickness With Exposed Support N/A N/A Classification: Structures Medium N/A N/A Exudate Amount: Serosanguineous N/A N/A Exudate Type: red, brown N/A N/A Exudate Color: Distinct, outline attached N/A N/A Wound Margin: Large (67-100%) N/A N/A Granulation Amount: Pink, Pale N/A N/A Granulation Quality: Small (1-33%) N/A N/A Necrotic Amount: Fat Layer (Subcutaneous Tissue): Yes N/A N/A Exposed Structures: HYLAND, VERHAEGHE ([id number]) [id number] Page 3 of 6 Fascia: No Tendon: No Muscle: No Joint: No Bone: No Medium (34-66%) N/A N/A Epithelialization: Debridement - Selective/Open Wound N/A N/A Debridement: Pre-procedure Verification/Time Out 10:30 N/A N/A Taken: Lidocaine 4% Topical Solution N/A N/A Pain Control: Slough N/A N/A Tissue Debrided: Non-Viable Tissue N/A N/A Level: 11.04 N/A N/A Debridement A (sq cm): rea Curette N/A N/A Instrument: Minimum N/A N/A Bleeding: Pressure N/A N/A Hemostasis A chieved: 0 N/A N/A Procedural Pain: 0 N/A N/A Post Procedural Pain: Procedure was tolerated well N/A N/A Debridement Treatment Response: 4.6x2.4x0.1 N/A N/A Post  Debridement Measurements L x W x D (cm) 0.867 N/A N/A Post Debridement Volume: (cm) Excoriation: No N/A N/A Periwound Skin Texture: Induration: No Callus: No Crepitus: No Rash: No Scarring: No Maceration: No N/A N/A Periwound Skin Moisture: Dry/Scaly: No Atrophie Blanche: No N/A N/A Periwound Skin Color: Cyanosis: No Ecchymosis: No Erythema: No Hemosiderin Staining: No Mottled: No Pallor: No Rubor: No No Abnormality N/A N/A Temperature: Cellular or Tissue Based Product N/A N/A Procedures Performed: Debridement Treatment Notes Wound #5 (Lower Leg) Wound Laterality: Left, Anterior, Distal Cleanser Wound Cleanser Discharge Instruction: Cleanse the wound with wound cleanser prior to applying a clean dressing using gauze sponges, not tissue or cotton balls. Peri-Wound Care Topical Primary Dressing Puraply #7 Discharge Instruction: applied by Pheonix Clinkscale. Adaptic and steri-strips Discharge Instruction: Used to secure Puraply AM. Leave in place. Do not remove. Secondary Dressing Zetuvit Plus Silicone Border Dressing 5x5 (in/in) Discharge Instruction: Apply silicone border over primary dressing as directed. Secured With Compression Wrap Compression Stockings Add-Ons Electronic Signature(s) Signed: 10/07/2022 11:11:47 AM By: Geralyn Corwin DO Entered By: Geralyn Corwin on 10/07/2022 11:01:42 [name] ([id number]) [id number] Page 4 of 6 -------------------------------------------------------------------------------- Multi-Disciplinary Care Plan Details Patient Name: Date of Service: [name] RO [facility] S. 10/07/2022 10:15 A M Medical Record Number: [id number] Patient Account Number: [id number] Date of Birth/Sex: Treating RN: May 24, 1946 (77 y.o. F) Primary Care Annastacia Duba: Burton Apley Other Clinician: Referring Deserie Dirks: Treating Leida Luton/Extender: Cherre Blanc in Treatment: 18 Active Inactive Pain, Acute or  Chronic Nursing Diagnoses: Pain, acute or chronic: actual or potential Potential alteration in comfort, pain Goals: Patient will verbalize adequate pain control and receive pain control interventions during procedures as needed Date Initiated: 06/03/2022 Target Resolution Date: 10/14/2022 Goal Status: Active Patient/caregiver will verbalize comfort level met Date Initiated: 06/03/2022 Target Resolution Date: 10/14/2022 Goal Status: Active Interventions: Encourage patient to take pain medications as prescribed Provide education on pain management Treatment Activities: Administer pain control measures as ordered : 06/03/2022 Notes: Electronic Signature(s) Signed: 10/10/2022 4:16:21 PM By: Thayer [name] Entered By: Thayer [name] on 10/07/2022 10:26:59 -------------------------------------------------------------------------------- Pain Assessment Details Patient Name: Date of Service: Hailey Diamond, DO [name] 10/07/2022 10:15 A M Medical Record Number: [id number] Patient Account Number: [id number] Date of Birth/Sex: Treating RN: 1946/02/18 (77 y.o. F) Primary Care Summerlyn Fickel: Burton Apley Other Clinician: Referring Leary Mcnulty: Treating Chanel Mcadams/Extender: Cherre Blanc in Treatment: 18 Active Problems Location of Pain  Severity and Description of Pain Patient Has Paino No Site Locations [place], [place] S ([id number]) [id number] Page 5 of 6 Pain Management and Medication Current Pain Management: Electronic Signature(s) Signed: 10/10/2022 4:16:21 PM By: Thayer [name] Entered By: Thayer [name] on 10/07/2022 10:21:35 -------------------------------------------------------------------------------- Patient/Caregiver Education Details Patient Name: Date of Service: [name], DO RO Leanne Lovely 3/22/2024andnbsp10:15 A M Medical Record Number: [id number] Patient Account Number: [id number] Date of Birth/Gender: Treating RN: 05-20-1946 (77 y.o.  F) Primary Care Physician: Burton Apley Other Clinician: Referring Physician: Treating Physician/Extender: Cherre Blanc in Treatment: 18 Education Assessment Education Provided To: Patient Education Topics Provided Wound/Skin Impairment: Handouts: Caring for Your Ulcer Methods: Explain/Verbal Responses: Reinforcements needed, State content correctly Electronic Signature(s) Signed: 10/10/2022 4:16:21 PM By: Thayer [name] Entered By: Thayer [name] on 10/07/2022 10:27:31 -------------------------------------------------------------------------------- Wound Assessment Details Patient Name: Date of Service: Hailey Diamond, DO [name] 10/07/2022 10:15 A M Medical Record Number: [id number] Patient Account Number: [id number] Date of Birth/Sex: Treating RN: May 27, 1946 (77 y.o. F) Primary Care Nahum Sherrer: Burton Apley Other Clinician: Referring Nicasio Barlowe: Treating Laneshia Pina/Extender: Cherre Blanc in Treatment: 18 Wound Status Wound Number: 5 Primary Cellulitis Etiology: Wound Location: Left, Distal, Anterior Lower Leg Wound Open Wounding Event: Gradually Appeared Status: Date Acquired: 04/19/2022 Comorbid Asthma, Sleep Apnea, Hypertension, Colitis, Rheumatoid Weeks Of Treatment: 18 History: Arthritis, Osteoarthritis Clustered Wound: No Photos Wound Measurements AUDIANNA, LANDGREN ([id number]) Length: (cm) 4.6 Width: (cm) 2.4 Depth: (cm) 0.1 Area: (cm) 8.671 Volume: (cm) 0.867 [id number] Page 6 of 6 % Reduction in Area: 44.2% % Reduction in Volume: 44.2% Epithelialization: Medium (34-66%) Tunneling: No Undermining: No Wound Description Classification: Full Thickness With Exposed Support Structures Wound Margin: Distinct, outline attached Exudate Amount: Medium Exudate Type: Serosanguineous Exudate Color: red, brown Foul Odor After Cleansing: No Slough/Fibrino Yes Wound Bed Granulation Amount:  Large (67-100%) Exposed Structure Granulation Quality: Pink, Pale Fascia Exposed: No Necrotic Amount: Small (1-33%) Fat Layer (Subcutaneous Tissue) Exposed: Yes Necrotic Quality: Adherent Slough Tendon Exposed: No Muscle Exposed: No Joint Exposed: No Bone Exposed: No Periwound Skin Texture Texture Color No Abnormalities Noted: No No Abnormalities Noted: No Callus: No Atrophie Blanche: No Crepitus: No Cyanosis: No Excoriation: No Ecchymosis: No Induration: No Erythema: No Rash: No Hemosiderin Staining: No Scarring: No Mottled: No Pallor: No Moisture Rubor: No No Abnormalities Noted: No Dry / Scaly: No Temperature / Pain Maceration: No Temperature: No Abnormality Electronic Signature(s) Signed: 11/03/2022 1:55:51 PM By: Brenton Grills Entered By: Brenton Grills on 10/07/2022 10:23:29 -------------------------------------------------------------------------------- Vitals Details Patient Name: Date of Service: Hailey Diamond, DO [name] 10/07/2022 10:15 A M Medical Record Number: [id number] Patient Account Number: [id number] Date of Birth/Sex: Treating RN: [birth year] (77 y.o. F) Primary Care Lashica Hannay: Burton Apley Other Clinician: Referring Ciani Rutten: Treating Edrik Rundle/Extender: Cherre Blanc in Treatment: 18 Vital Signs Time Taken: 10:20 Temperature (F): 98.1 Height (in): 61 Pulse (bpm): 85 Weight (lbs): 186 Respiratory Rate (breaths/min): 18 Body Mass Index (BMI): 35.1 Blood Pressure (mmHg): 127/78 Reference Range: 80 - 120 mg / dl Electronic Signature(s) Signed: 10/10/2022 4:16:21 PM By: Thayer [name] Entered By: Thayer [name] on 10/07/2022 10:21:13

## 2022-11-03 NOTE — Progress Notes (Signed)
[name] ([id number]) [id number] Page 1 of 11 Visit Report for 11/03/2022 Chief Complaint Document Details Patient Name: Date of Service: Hailey Diamond, DO [name] 11/03/2022 11:00 A M Medical Record Number: [id number] Patient Account Number: [id number] Date of Birth/Sex: Treating RN: Mar 11, 1946 (77 y.o. F) Primary Care Provider: Burton Apley Other Clinician: Referring Provider: Treating Provider/Extender: Cherre Blanc in Treatment: 21 Information Obtained from: Patient Chief Complaint 06/03/2022; Left lower extremity wounds Electronic Signature(s) Signed: 11/03/2022 11:45:36 AM By: Geralyn Corwin DO Entered By: Geralyn Corwin on 11/03/2022 11:41:33 -------------------------------------------------------------------------------- Cellular or Tissue Based Product Details Patient Name: Date of Service: Hailey Diamond, DO [name] 11/03/2022 11:00 A M Medical Record Number: [id number] Patient Account Number: [id number] Date of Birth/Sex: Treating RN: [birth year] (77 y.o. [name] Primary Care Provider: Burton Apley Other Clinician: Referring Provider: Treating Provider/Extender: Cherre Blanc in Treatment: 21 Cellular or Tissue Based Product Type Wound #5 Left,Distal,Anterior Lower Leg Applied to: Performed By: Physician Geralyn Corwin, DO Cellular or Tissue Based Product Type: Puraply AM Level of Consciousness (Pre-procedure): Awake and Alert Pre-procedure Verification/Time Out Yes - 11:23 Taken: Location: trunk / arms / legs Wound Size (sq cm): 6.24 Product Size (sq cm): 12 Waste Size (sq cm): 0 Amount of Product Applied (sq cm): 12 Instrument Used: Forceps, Scissors Lot #: [id number] Order #: 12 Expiration Date: 11/05/2024 Fenestrated: No Reconstituted: Yes Solution Type: normal saline Solution Amount: 3mL Lot #: [id number] Solution Expiration Date: 12/15/2024 Secured: Yes Secured  With: Steri-Strips Dressing Applied: No Procedural Pain: 0 Post Procedural Pain: 0 Response to Treatment: Procedure was tolerated well Level of Consciousness (Post- Awake and Alert procedure): Post Procedure Diagnosis Same as Pre-procedure Notes Scribed for Dr. Mikey Bussing by Brenton Grills RN. Electronic Signature(s) [name] ([id number]) [id number] Page 2 of 11 Signed: 11/03/2022 11:45:36 AM By: Geralyn Corwin DO Signed: 11/03/2022 1:56:54 PM By: Brenton Grills Entered By: Brenton Grills on 11/03/2022 11:28:28 -------------------------------------------------------------------------------- Debridement Details Patient Name: Date of Service: Hailey Diamond, DO [name] 11/03/2022 11:00 A M Medical Record Number: [id number] Patient Account Number: [id number] Date of Birth/Sex: Treating RN: 1945-08-23 (77 y.o. [name] Primary Care Provider: Burton Apley Other Clinician: Referring Provider: Treating Provider/Extender: Cherre Blanc in Treatment: 21 Debridement Performed for Assessment: Wound #5 Left,Distal,Anterior Lower Leg Performed By: Physician Geralyn Corwin, DO Debridement Type: Debridement Level of Consciousness (Pre-procedure): Awake and Alert Pre-procedure Verification/Time Out Yes - 11:18 Taken: Start Time: 11:18 Pain Control: Lidocaine 4% T opical Solution T Area Debrided (L x W): otal 3.9 (cm) x 1.6 (cm) = 6.24 (cm) Tissue and other material debrided: Non-Viable, Slough, Slough Level: Non-Viable Tissue Debridement Description: Selective/Open Wound Instrument: Curette Bleeding: Minimum Hemostasis Achieved: Pressure End Time: 11:20 Procedural Pain: 0 Post Procedural Pain: 0 Response to Treatment: Procedure was tolerated well Level of Consciousness (Post- Awake and Alert procedure): Post Debridement Measurements of Total Wound Length: (cm) 3.9 Width: (cm) 1.6 Depth: (cm) 0.2 Volume: (cm)  0.98 Character of Wound/Ulcer Post Debridement: Improved Post Procedure Diagnosis Same as Pre-procedure Notes Scribed for Dr. Mikey Bussing by Brenton Grills RN. Electronic Signature(s) Signed: 11/03/2022 11:45:36 AM By: Geralyn Corwin DO Signed: 11/03/2022 1:56:54 PM By: Brenton Grills Entered By: Brenton Grills on 11/03/2022 11:22:09 -------------------------------------------------------------------------------- HPI Details Patient Name: Date of Service: Hailey Diamond, DO [name] 11/03/2022 11:00 A M Medical Record Number: [id number] Patient Account Number: [id number] Date of Birth/Sex: Treating RN: 1946-02-09 (77 y.o. F) Primary Care  Provider: Burton Apley Other Clinician: Referring Provider: Treating Provider/Extender: Cherre Blanc in Treatment: 21 History of Present Illness HPI Description: Patient presents today for initial evaluation and our clinic as a referral from the [facility] health system Department of orthopedics for evaluation and treatment of the wound to his his of the left foot at the base of the great toe. Currently the good news is the patient really does not have any significant pain which is excellent. She has been seen by Dr. Luciana Axe at [facility]: infectious disease clinic that is the regional Center for infectious disease. Subsequently the patient was cultured and positive for methicillin sensitive staph aureus. She has been on antibiotics including Cipro prior to surgery as will several days following surgery. She then had clindamycin which was changed to doxycycline. She took this for 10 days. Subsequently the pain had improved [name], [name] ([id number]) [id number] Page 3 of 11 and there was no longer any possible draining from the wound according to the notes. The patient sedentary rate as well as C-reactive protein have returned to normal ranges. Currently per Dr. Ephriam Knuckles assessment there was one dehiscence with no  sign of osteomyelitis. He therefore discontinue the antibiotics with the completion of the last three days Of doxycycline. Other than that he just set her for a follow-up as needed. The patient does not have diabetes and is not a current smoker. At this time her treatment that was recommended by the surgeon was applying a Hydrocolloid dressing. Based on what I'm seeing at this point the patient actually has some Slough noted over the surface of the wound there really does not appear to be evidence of infection though I do think she does require some sharp debridement to clear away the slough and help with appropriate wound healing. She does have areas of granulation buds noted. She may be a candidate for a skin substitute as well. We will see how things do over the next period of time until follow-up. No fevers, chills, nausea, or vomiting noted at this time. 06/13/18 on evaluation today patient actually appears to be doing better in regard to her toe ulcer. She's been using the Prisma on this region and that seems to have done extremely well for her. Fortunately there does not appear to be evidence of infection at this time which is good news. She did see her surgeon they were extremely pleased with the overall appearance of her wound. 06/27/18 on evaluation today patient actually appears to be doing very well in regard to her foot ulcer. She has been tolerating the dressing changes without complication which is great news. She did see her podiatrist they felt like everything is looking very nice as well and will please. 07/05/2018 surgical wound on the left foot. She appears to be doing well. Still surface debrided to remove however in general post debridement the wound bed looks quite healthy it is come down significantly in terms of dimensions using silver collagen. The patient describes pain when her foot is elevated either in bed at night [sometimes keeps her awake] or when is propped up on a  foot rest. She basically takes analgesics for this. She does not really describe claudication with activity however her activity is very limited by interstitial lung disease. Her ABIs initially in this clinic were noncompressible. She is not a diabetic 07/20/2018; surgical wound on the left foot. Wound actually looks quite a bit better than I remember seeing this. She is still describing pain  with her leg elevated at night that she does not get when the wound is supine. She does not really describe claudication but she is very limited by her pulmonary status. We have been using silver collagen. 1/17; the patient has been followed up by orthopedics and discharge. Her arterial studies were actually quite good and should not be contributing to any pain. Slight reduction in ABIs but otherwise normal. Her wound is closed. She saw Dr. Luciana Axe of infectious disease surrounding the surgery. By review of our notes she was not felt to have osteomyelitis. 06/03/2022 [name] is a 77 year old female with a past medical history of interstitial lung disease on chronic oxygen via nasal cannula, rheumatoid arthritis, chronic diastolic heart failure and OSA that presents to the clinic for a 1 to 75-month history of nonhealing ulcer to the left lower extremity. On 04/20/2022 she was admitted to the hospital for left lower extremity cellulitis. She required 8 days of IV vancomycin and Unasyn followed by a 14-day course of Augmentin and doxycycline. She has been using Silvadene cream to the area. She does not use compression therapy but does own compression stockings. She currently denies signs of infection. 11/27; patient presents for follow-up she is using Medihoney and Hydrofera Blue to the wound beds. She has been using her Tubigrip. She has no issues or complaints today. 12/4; patient presents for follow-up. She continues to use Medihoney and Hydrofera Blue to the wound beds. Several attempts have been made to  schedule ABIs with TBI's. We gave patient the number today to call to have these scheduled. She has been using Tubigrip. She has no issues or complaints today. 12/18; she continues to use Medihoney and Hydrofera Blue in general the surface of the wound is looking somewhat better this week and measurements are slightly smaller. She did have her arterial studies done these were noncompressible bilaterally however TBI on the right of 0.69 on the left 0.62. Waveforms on the right were triphasic on the left biphasic to triphasic. This does not suggest significant arterial disease to make healing wounds at this location difficult. 12/29; patient presents for follow-up. She has been using Medihoney and Hydrofera Blue to the wound bed. She has been using Tubigrip. 1/5; patient presents for follow-up. She is been using Medihoney and Hydrofera Blue to the wound bed along with Tubigrip. The Tubigrip was doubled at last clinic visit. There is been improvement in wound healing. 1/12; patient presents for follow-up. She has been using Medihoney and Hydrofera Blue to the wound bed along with Tubigrip. There continues to be improvement in wound healing. 1/19; patient presents for follow-up. She has been using Medihoney and Hydrofera Blue to the wound bed along with her juxta light compression daily. There is been improvement in wound measurements. Patient has no issues or complaints today. 1/25; patient presents for follow-up. She has been using blast X and collagen to the wound bed under juxta light compression daily. She has no issues or complaints today. There is been improvement in wound healing. 2/1; patient presents for follow-up. She has been using blast X and collagen to the wound bed under juxta light compression daily. She has no issues or complaints today. 2/9; patient presents for follow-up. She has been using Hydrofera Blue with blast X under compression wrap daily. She has been approved for a skin  substitute and patient would like to proceed with this. 2/15; patient presents for follow-up. PuraPly was placed in standard fashion to the large anterior leg wound.  She has been using blast X and Hydrofera Blue to the more proximal small wound. She has been using her juxta light compression daily. She has no issues or complaints today. 2/23; patient presents for follow-up. PuraPly was placed in standard fashion to the large anterior leg wound. The more proximal small wound is almost healed and they have been using blast X and Hydrofera Blue. She has been using her juxta light compression daily to the left lower extremity. There is been improvement in wound healing. 2/29; patient presents for follow-up. PuraPly was placed in standard fashion to the large anterior leg wound. The more proximal wound has healed. She is been using her juxta light compression daily. She has no issues or complaints today. 3/8; patient presents for follow-up. PuraPly has been used to the wound bed weekly patient has done well with this. She has been using her juxta light compression daily. She has no issues or complaints today. 3/15; patient presents for follow-up. We have been using PuraPly to the wound bed weekly. She has been using her juxta light compression daily. She has been doing well with this treatment. Her wound has improved. She has no issues or complaints today. 3/22; patient presents for follow-up. We have been using PuraPly to the wound bed weekly. She has been using her juxta lite compression. Overall more granulation tissue present today. Patient has no issues or complaints today. 3/29; patient presents for follow-up. We have been using PuraPly to the wound bed weekly. She has been using her juxta lite compression daily. The wound is smaller. 10/20/2022: The wound continues to contract. There is some slough accumulation in the deepest portion of the wound. No concern for infection. 4/12; patient presents  for follow-up. We have been placing PuraPly to the wound bed weekly. There has been improvement in wound healing. 4/18; patient presents for follow-up. We have been using PuraPly to the wound bed. There continues to be improvement in wound healing. She has no issues or [name], [name] ([id number]) [id number] Page 4 of 11 complaints. Electronic Signature(s) Signed: 11/03/2022 11:45:36 AM By: Geralyn Corwin DO Entered By: Geralyn Corwin on 11/03/2022 11:42:05 -------------------------------------------------------------------------------- Physical Exam Details Patient Name: Date of Service: Hailey Diamond, DO [name] 11/03/2022 11:00 A M Medical Record Number: [id number] Patient Account Number: [id number] Date of Birth/Sex: Treating RN: [birth year] (77 y.o. F) Primary Care Provider: Burton Apley Other Clinician: Referring Provider: Treating Provider/Extender: Cherre Blanc in Treatment: 21 Constitutional respirations regular, non-labored and within target range for patient.. Cardiovascular 2+ dorsalis pedis/posterior tibialis pulses. Psychiatric pleasant and cooperative. Notes T the right lower extremity there is an open wound with granulation tissue and scant slough accumulation. Epithelization occurring to the edges. No signs of o infection. Good edema control. Electronic Signature(s) Signed: 11/03/2022 11:45:36 AM By: Geralyn Corwin DO Entered By: Geralyn Corwin on 11/03/2022 11:43:12 -------------------------------------------------------------------------------- Physician Orders Details Patient Name: Date of Service: Hailey Diamond, DO [name] 11/03/2022 11:00 A M Medical Record Number: [id number] Patient Account Number: [id number] Date of Birth/Sex: Treating RN: [birth year] (77 y.o. Arta Silence Primary Care Provider: Burton Apley Other Clinician: Referring Provider: Treating Provider/Extender: Cherre Blanc in Treatment: 21 Verbal / Phone Orders: No Diagnosis Coding ICD-10 Coding Code Description [phone] Non-pressure chronic ulcer of other part of left lower leg with other specified severity I87.312 Chronic venous hypertension (idiopathic) with ulcer of left lower extremity J84.9 Interstitial pulmonary disease, unspecified M06.9 Rheumatoid arthritis, unspecified Follow-up Appointments ppointment  in 1 week. - Dr Mikey Bussing - Rm #8 on 11/10/22 @ 11:00 Return A ppointment in 2 weeks. - Dr Mikey Bussing - Rm #8 on 11/17/22 @ 11:00 Return A Anesthetic (In clinic) Topical Lidocaine 5% applied to wound bed Cellular or Tissue Based Products Wound #5 Left,Distal,Anterior Lower Leg Cellular or Tissue Based Product Type: - 08/18/2022 insurance ran organogenesis- 100% covered. 08/26/2022 #1 Puraply AM applied. 09/01/2022 #2 Puraply AM applied. 09/09/2022 #3 Puraply AM applied to both wounds. 09/15/2022 #4 Puraply AM applied to wound. 09/23/2022 #5 Puraply AM applied to wound. 09/30/22 # 6 PUraply AM applied to wound. [name], [name] ([id number]) [id number] Page 5 of 11 10/07/22 #7 Puraply AM applied to wound. 10/14/2022 #8 Puraply AM applied to wound. 10/20/22 #9 Puraply AM applied to wound 10/28/2022 #11 Puraply AM applied to wound. 11/03/2022 #12 Puraply AM applied to wound. Cellular or Tissue Based Product applied to wound bed, secured with steri-strips, cover with Adaptic or Mepitel. (DO NOT REMOVE). Bathing/ Shower/ Hygiene May shower with protection but do not get wound dressing(s) wet. Protect dressing(s) with water repellant cover (for example, large plastic bag) or a cast cover and may then take shower. Edema Control - Lymphedema / SCD / Other Elevate legs to the level of the heart or above for 30 minutes daily and/or when sitting for 3-4 times a day throughout the day. Avoid standing for long periods of time. Exercise regularly Compression stocking or Garment 20-30  mm/Hg pressure to: - start wearing the juxtalite HD apply in the morning and remove at night left leg. Non Wound Condition Protect area with: - buttock area with AandD ointment daily Wound Treatment Wound #5 - Lower Leg Wound Laterality: Left, Anterior, Distal Cleanser: Wound Cleanser (Generic) 1 x Per Week/30 Days Discharge Instructions: Cleanse the wound with wound cleanser prior to applying a clean dressing using gauze sponges, not tissue or cotton balls. Prim Dressing: Puraply #12 1 x Per Week/30 Days ary Discharge Instructions: applied by provider. Prim Dressing: Adaptic and steri-strips 1 x Per Week/30 Days ary Discharge Instructions: Used to secure Puraply AM. Leave in place. Do not remove. Secondary Dressing: Zetuvit Plus Silicone Border Dressing 5x5 (in/in) 1 x Per Week/30 Days Discharge Instructions: Apply silicone border over primary dressing as directed. Electronic Signature(s) Signed: 11/03/2022 11:45:36 AM By: Geralyn Corwin DO Entered By: Geralyn Corwin on 11/03/2022 11:43:23 -------------------------------------------------------------------------------- Problem List Details Patient Name: Date of Service: Hailey Diamond, DO [name] 11/03/2022 11:00 A M Medical Record Number: [id number] Patient Account Number: [id number] Date of Birth/Sex: Treating RN: [birth year] (77 y.o. [name], [name] Primary Care Provider: Burton Apley Other Clinician: Referring Provider: Treating Provider/Extender: Cherre Blanc in Treatment: 21 Active Problems ICD-10 Encounter Code Description Active Date MDM Diagnosis [phone] Non-pressure chronic ulcer of other part of left lower leg with other specified 06/03/2022 No Yes severity I87.312 Chronic venous hypertension (idiopathic) with ulcer of left lower extremity 06/03/2022 No Yes J84.9 Interstitial pulmonary disease, unspecified 06/03/2022 No Yes M06.9 Rheumatoid arthritis, unspecified 06/03/2022 No Yes Inactive  Problems AADHIRA, HEFFERNAN ([id number]) [id number] Page 6 of 11 Resolved Problems Electronic Signature(s) Signed: 11/03/2022 11:45:36 AM By: Geralyn Corwin DO Entered By: Geralyn Corwin on 11/03/2022 11:41:04 -------------------------------------------------------------------------------- Progress Note Details Patient Name: Date of Service: Hailey Diamond, DO [name] 11/03/2022 11:00 A M Medical Record Number: [id number] Patient Account Number: [id number] Date of Birth/Sex: Treating RN: 07/26/1945 (77 y.o. F) Primary Care Provider: Burton Apley Other Clinician: Referring Provider: Treating Provider/Extender: Mikey Bussing  [name], Ronal Fear in Treatment: 21 Subjective Chief Complaint Information obtained from Patient 06/03/2022; Left lower extremity wounds History of Present Illness (HPI) Patient presents today for initial evaluation and our clinic as a referral from the [facility] health system Department of orthopedics for evaluation and treatment of the wound to his his of the left foot at the base of the great toe. Currently the good news is the patient really does not have any significant pain which is excellent. She has been seen by Dr. Luciana Axe at [facility]: infectious disease clinic that is the regional Center for infectious disease. Subsequently the patient was cultured and positive for methicillin sensitive staph aureus. She has been on antibiotics including Cipro prior to surgery as will several days following surgery. She then had clindamycin which was changed to doxycycline. She took this for 10 days. Subsequently the pain had improved and there was no longer any possible draining from the wound according to the notes. The patient sedentary rate as well as C-reactive protein have returned to normal ranges. Currently per Dr. Ephriam Knuckles assessment there was one dehiscence with no sign of osteomyelitis. He therefore discontinue the antibiotics with the  completion of the last three days Of doxycycline. Other than that he just set her for a follow-up as needed. The patient does not have diabetes and is not a current smoker. At this time her treatment that was recommended by the surgeon was applying a Hydrocolloid dressing. Based on what I'm seeing at this point the patient actually has some Slough noted over the surface of the wound there really does not appear to be evidence of infection though I do think she does require some sharp debridement to clear away the slough and help with appropriate wound healing. She does have areas of granulation buds noted. She may be a candidate for a skin substitute as well. We will see how things do over the next period of time until follow-up. No fevers, chills, nausea, or vomiting noted at this time. 06/13/18 on evaluation today patient actually appears to be doing better in regard to her toe ulcer. She's been using the Prisma on this region and that seems to have done extremely well for her. Fortunately there does not appear to be evidence of infection at this time which is good news. She did see her surgeon they were extremely pleased with the overall appearance of her wound. 06/27/18 on evaluation today patient actually appears to be doing very well in regard to her foot ulcer. She has been tolerating the dressing changes without complication which is great news. She did see her podiatrist they felt like everything is looking very nice as well and will please. 07/05/2018 surgical wound on the left foot. She appears to be doing well. Still surface debrided to remove however in general post debridement the wound bed looks quite healthy it is come down significantly in terms of dimensions using silver collagen. The patient describes pain when her foot is elevated either in bed at night [sometimes keeps her awake] or when is propped up on a foot rest. She basically takes analgesics for this. She does not really  describe claudication with activity however her activity is very limited by interstitial lung disease. Her ABIs initially in this clinic were noncompressible. She is not a diabetic 07/20/2018; surgical wound on the left foot. Wound actually looks quite a bit better than I remember seeing this. She is still describing pain with her leg elevated at night that she  does not get when the wound is supine. She does not really describe claudication but she is very limited by her pulmonary status. We have been using silver collagen. 1/17; the patient has been followed up by orthopedics and discharge. Her arterial studies were actually quite good and should not be contributing to any pain. Slight reduction in ABIs but otherwise normal. Her wound is closed. She saw Dr. Luciana Axe of infectious disease surrounding the surgery. By review of our notes she was not felt to have osteomyelitis. 06/03/2022 Ms. Anida Gleaton is a 77 year old female with a past medical history of interstitial lung disease on chronic oxygen via nasal cannula, rheumatoid arthritis, chronic diastolic heart failure and OSA that presents to the clinic for a 1 to 25-month history of nonhealing ulcer to the left lower extremity. On 04/20/2022 she was admitted to the hospital for left lower extremity cellulitis. She required 8 days of IV vancomycin and Unasyn followed by a 14-day course of Augmentin and doxycycline. She has been using Silvadene cream to the area. She does not use compression therapy but does own compression stockings. She currently denies signs of infection. 11/27; patient presents for follow-up she is using Medihoney and Hydrofera Blue to the wound beds. She has been using her Tubigrip. She has no issues or complaints today. 12/4; patient presents for follow-up. She continues to use Medihoney and Hydrofera Blue to the wound beds. Several attempts have been made to schedule ABIs with TBI's. We gave patient the number today to call to  have these scheduled. She has been using Tubigrip. She has no issues or complaints today. 12/18; she continues to use Medihoney and Hydrofera Blue in general the surface of the wound is looking somewhat better this week and measurements are slightly smaller. She did have her arterial studies done these were noncompressible bilaterally however TBI on the right of 0.69 on the left 0.62. Waveforms on the right were triphasic on the left biphasic to triphasic. This does not suggest significant arterial disease to make healing wounds at this location difficult. 12/29; patient presents for follow-up. She has been using Medihoney and Hydrofera Blue to the wound bed. She has been using Tubigrip. 1/5; patient presents for follow-up. She is been using Medihoney and Hydrofera Blue to the wound bed along with Tubigrip. The Tubigrip was doubled at last clinic visit. There is been improvement in wound healing. 1/12; patient presents for follow-up. She has been using Medihoney and Hydrofera Blue to the wound bed along with Tubigrip. There continues to be improvement in wound healing. 1/19; patient presents for follow-up. She has been using Medihoney and Hydrofera Blue to the wound bed along with her juxta light compression daily. There is [name], [name] ([id number]) [id number] Page 7 of 11 been improvement in wound measurements. Patient has no issues or complaints today. 1/25; patient presents for follow-up. She has been using blast X and collagen to the wound bed under juxta light compression daily. She has no issues or complaints today. There is been improvement in wound healing. 2/1; patient presents for follow-up. She has been using blast X and collagen to the wound bed under juxta light compression daily. She has no issues or complaints today. 2/9; patient presents for follow-up. She has been using Hydrofera Blue with blast X under compression wrap daily. She has been approved for  a skin substitute and patient would like to proceed with this. 2/15; patient presents for follow-up. PuraPly was placed in standard fashion to the large anterior leg  wound. She has been using blast X and Hydrofera Blue to the more proximal small wound. She has been using her juxta light compression daily. She has no issues or complaints today. 2/23; patient presents for follow-up. PuraPly was placed in standard fashion to the large anterior leg wound. The more proximal small wound is almost healed and they have been using blast X and Hydrofera Blue. She has been using her juxta light compression daily to the left lower extremity. There is been improvement in wound healing. 2/29; patient presents for follow-up. PuraPly was placed in standard fashion to the large anterior leg wound. The more proximal wound has healed. She is been using her juxta light compression daily. She has no issues or complaints today. 3/8; patient presents for follow-up. PuraPly has been used to the wound bed weekly patient has done well with this. She has been using her juxta light compression daily. She has no issues or complaints today. 3/15; patient presents for follow-up. We have been using PuraPly to the wound bed weekly. She has been using her juxta light compression daily. She has been doing well with this treatment. Her wound has improved. She has no issues or complaints today. 3/22; patient presents for follow-up. We have been using PuraPly to the wound bed weekly. She has been using her juxta lite compression. Overall more granulation tissue present today. Patient has no issues or complaints today. 3/29; patient presents for follow-up. We have been using PuraPly to the wound bed weekly. She has been using her juxta lite compression daily. The wound is smaller. 10/20/2022: The wound continues to contract. There is some slough accumulation in the deepest portion of the wound. No concern for infection. 4/12; patient  presents for follow-up. We have been placing PuraPly to the wound bed weekly. There has been improvement in wound healing. 4/18; patient presents for follow-up. We have been using PuraPly to the wound bed. There continues to be improvement in wound healing. She has no issues or complaints. Patient History Information obtained from Patient. Family History Cancer - Maternal Grandparents, Diabetes - Siblings, Heart Disease - Maternal Grandparents,Paternal Grandparents,Mother,Father,Siblings, Hypertension - Mother,Father,Siblings, Stroke - Paternal Grandparents, No family history of Hereditary Spherocytosis, Kidney Disease, Lung Disease, Seizures, Thyroid Problems, Tuberculosis. Social History Never smoker, Marital Status - Married, Alcohol Use - Moderate, Drug Use - No History, Caffeine Use - Moderate. Medical History Eyes Denies history of Cataracts, Optic Neuritis Ear/Nose/Mouth/Throat Denies history of Chronic sinus problems/congestion Hematologic/Lymphatic Denies history of Anemia, Hemophilia, Human Immunodeficiency Virus, Lymphedema, Sickle Cell Disease Respiratory Patient has history of Asthma, Sleep Apnea Denies history of Aspiration, Chronic Obstructive Pulmonary Disease (COPD), Pneumothorax, Tuberculosis Cardiovascular Patient has history of Hypertension Denies history of Angina, Arrhythmia, Congestive Heart Failure, Coronary Artery Disease, Deep Vein Thrombosis, Hypotension, Myocardial Infarction, Peripheral Arterial Disease, Peripheral Venous Disease, Phlebitis, Vasculitis Gastrointestinal Patient has history of Colitis Denies history of Cirrhosis , Crohnoos, Hepatitis A, Hepatitis B, Hepatitis C Endocrine Denies history of Type I Diabetes, Type II Diabetes Immunological Denies history of Lupus Erythematosus, Raynaudoos, Scleroderma Integumentary (Skin) Denies history of History of Burn Musculoskeletal Patient has history of Rheumatoid Arthritis, Osteoarthritis Denies  history of Gout, Osteomyelitis Neurologic Denies history of Dementia, Neuropathy, Quadriplegia, Paraplegia, Seizure Disorder Oncologic Denies history of Received Chemotherapy, Received Radiation Psychiatric Denies history of Anorexia/bulimia, Confinement Anxiety Hospitalization/Surgery History - surgery toe. - right hip replacement 12/2018. - heart cath 12/17/2021. Medical A Surgical History Notes nd Respiratory 4L [place] 02 pulmonary fibrosis [name], [name] ([id number]) [id number] Page  8 of 11 Objective Constitutional respirations regular, non-labored and within target range for patient.. Vitals Time Taken: 10:59 AM, Height: 61 in, Weight: 186 lbs, BMI: 35.1, Temperature: 98.1 F, Pulse: 90 bpm, Respiratory Rate: 18 breaths/min, Blood Pressure: 139/73 mmHg. Cardiovascular 2+ dorsalis pedis/posterior tibialis pulses. Psychiatric pleasant and cooperative. General Notes: T the right lower extremity there is an open wound with granulation tissue and scant slough accumulation. Epithelization occurring to the edges. o No signs of infection. Good edema control. Integumentary (Hair, Skin) Wound #5 status is Open. Original cause of wound was Gradually Appeared. The date acquired was: 04/19/2022. The wound has been in treatment 21 weeks. The wound is located on the [facility] Lower Leg. The wound measures 3.9cm length x 1.6cm width x 0.2cm depth; 4.901cm^2 area and 0.98cm^3 volume. There is Fat Layer (Subcutaneous Tissue) exposed. There is no tunneling or undermining noted. There is a medium amount of serosanguineous drainage noted. The wound margin is distinct with the outline attached to the wound base. There is large (67-100%) pink, pale, hyper - granulation within the wound bed. There is a small (1-33%) amount of necrotic tissue within the wound bed including Adherent Slough. The periwound skin appearance did not exhibit: Callus, Crepitus, Excoriation,  Induration, Rash, Scarring, Dry/Scaly, Maceration, Atrophie Blanche, Cyanosis, Ecchymosis, Hemosiderin Staining, Mottled, Pallor, Rubor, Erythema. Periwound temperature was noted as No Abnormality. Assessment Active Problems ICD-10 Non-pressure chronic ulcer of other part of left lower leg with other specified severity Chronic venous hypertension (idiopathic) with ulcer of left lower extremity Interstitial pulmonary disease, unspecified Rheumatoid arthritis, unspecified Patient's wound has shown improvement in size in appearance since last clinic visit. I debrided nonviable tissue. PuraPly was placed in standard fashion. Continue compression Velcro wrap. Follow-up in 1 week. Procedures Wound #5 Pre-procedure diagnosis of Wound #5 is a Cellulitis located on the Left,Distal,Anterior Lower Leg . There was a Selective/Open Wound Non-Viable Tissue Debridement with a total area of 6.24 sq cm performed by Geralyn Corwin, DO. With the following instrument(s): Curette to remove Non-Viable tissue/material. Material removed includes Slough after achieving pain control using Lidocaine 4% T opical Solution. No specimens were taken. A time out was conducted at 11:18, prior to the start of the procedure. A Minimum amount of bleeding was controlled with Pressure. The procedure was tolerated well with a pain level of 0 throughout and a pain level of 0 following the procedure. Post Debridement Measurements: 3.9cm length x 1.6cm width x 0.2cm depth; 0.98cm^3 volume. Character of Wound/Ulcer Post Debridement is improved. Post procedure Diagnosis Wound #5: Same as Pre-Procedure General Notes: Scribed for Dr. Mikey Bussing by Brenton Grills RN.. Pre-procedure diagnosis of Wound #5 is a Cellulitis located on the Left,Distal,Anterior Lower Leg. A skin graft procedure using a bioengineered skin substitute/cellular or tissue based product was performed by Geralyn Corwin, DO with the following instrument(s): Forceps and  Scissors. Puraply AM was applied and secured with Steri-Strips. 12 sq cm of product was utilized and 0 sq cm was wasted. Post Application, no dressing was applied. A Time Out was conducted at 11:23, prior to the start of the procedure. The procedure was tolerated well with a pain level of 0 throughout and a pain level of 0 following the procedure. Post procedure Diagnosis Wound #5: Same as Pre-Procedure General Notes: Scribed for Dr. Mikey Bussing by Brenton Grills RN. Plan Follow-up Appointments: [name], [name] ([id number]) [id number] Page 9 of 11 Return Appointment in 1 week. - Dr Mikey Bussing - Rm #8 on 11/10/22 @ 11:00 Return Appointment in 2  weeks. - Dr Mikey Bussing - Rm #8 on 11/17/22 @ 11:00 Anesthetic: (In clinic) Topical Lidocaine 5% applied to wound bed Cellular or Tissue Based Products: Wound #5 Left,Distal,Anterior Lower Leg: Cellular or Tissue Based Product Type: - 08/18/2022 insurance ran organogenesis- 100% covered. 08/26/2022 #1 Puraply AM applied. 09/01/2022 #2 Puraply AM applied. 09/09/2022 #3 Puraply AM applied to both wounds. 09/15/2022 #4 Puraply AM applied to wound. 09/23/2022 #5 Puraply AM applied to wound. 09/30/22 # 6 PUraply AM applied to wound. 10/07/22 #7 Puraply AM applied to wound. 10/14/2022 #8 Puraply AM applied to wound. 10/20/22 #9 Puraply AM applied to wound 10/28/2022 #11 Puraply AM applied to wound. 11/03/2022 #12 Puraply AM applied to wound. Cellular or Tissue Based Product applied to wound bed, secured with steri-strips, cover with Adaptic or Mepitel. (DO NOT REMOVE). Bathing/ Shower/ Hygiene: May shower with protection but do not get wound dressing(s) wet. Protect dressing(s) with water repellant cover (for example, large plastic bag) or a cast cover and may then take shower. Edema Control - Lymphedema / SCD / Other: Elevate legs to the level of the heart or above for 30 minutes daily and/or when sitting for 3-4 times a day throughout the day. Avoid standing  for long periods of time. Exercise regularly Compression stocking or Garment 20-30 mm/Hg pressure to: - start wearing the juxtalite HD apply in the morning and remove at night left leg. Non Wound Condition: Protect area with: - buttock area with AandD ointment daily WOUND #5: - Lower Leg Wound Laterality: Left, Anterior, Distal Cleanser: Wound Cleanser (Generic) 1 x Per Week/30 Days Discharge Instructions: Cleanse the wound with wound cleanser prior to applying a clean dressing using gauze sponges, not tissue or cotton balls. Prim Dressing: Puraply #12 1 x Per Week/30 Days ary Discharge Instructions: applied by provider. Prim Dressing: Adaptic and steri-strips 1 x Per Week/30 Days ary Discharge Instructions: Used to secure Puraply AM. Leave in place. Do not remove. Secondary Dressing: Zetuvit Plus Silicone Border Dressing 5x5 (in/in) 1 x Per Week/30 Days Discharge Instructions: Apply silicone border over primary dressing as directed. 1. In office sharp debridement 2. PuraPly placed in standard fashion 3. Follow-up in 1 week Electronic Signature(s) Signed: 11/03/2022 11:45:36 AM By: Geralyn Corwin DO Entered By: Geralyn Corwin on 11/03/2022 11:44:16 -------------------------------------------------------------------------------- HxROS Details Patient Name: Date of Service: Hailey Diamond, DO [name] 11/03/2022 11:00 A M Medical Record Number: [id number] Patient Account Number: [id number] Date of Birth/Sex: Treating RN: 08/18/1945 (77 y.o. F) Primary Care Provider: Burton Apley Other Clinician: Referring Provider: Treating Provider/Extender: Cherre Blanc in Treatment: 21 Information Obtained From Patient Eyes Medical History: Negative for: Cataracts; Optic Neuritis Ear/Nose/Mouth/Throat Medical History: Negative for: Chronic sinus problems/congestion Hematologic/Lymphatic Medical History: Negative for: Anemia; Hemophilia; Human Immunodeficiency Virus;  Lymphedema; Sickle Cell Disease Respiratory Medical History: Positive for: Asthma; Sleep Apnea Negative for: Aspiration; Chronic Obstructive Pulmonary Disease (COPD); Pneumothorax; Tuberculosis Past Medical History Notes: 4L [place] 02 pulmonary fibrosis [name], [name] ([id number]) [id number] Page 10 of 11 Cardiovascular Medical History: Positive for: Hypertension Negative for: Angina; Arrhythmia; Congestive Heart Failure; Coronary Artery Disease; Deep Vein Thrombosis; Hypotension; Myocardial Infarction; Peripheral Arterial Disease; Peripheral Venous Disease; Phlebitis; Vasculitis Gastrointestinal Medical History: Positive for: Colitis Negative for: Cirrhosis ; Crohns; Hepatitis A; Hepatitis B; Hepatitis C Endocrine Medical History: Negative for: Type I Diabetes; Type II Diabetes Immunological Medical History: Negative for: Lupus Erythematosus; Raynauds; Scleroderma Integumentary (Skin) Medical History: Negative for: History of Burn Musculoskeletal Medical History: Positive for: Rheumatoid Arthritis; Osteoarthritis  Negative for: Gout; Osteomyelitis Neurologic Medical History: Negative for: Dementia; Neuropathy; Quadriplegia; Paraplegia; Seizure Disorder Oncologic Medical History: Negative for: Received Chemotherapy; Received Radiation Psychiatric Medical History: Negative for: Anorexia/bulimia; Confinement Anxiety Immunizations Pneumococcal Vaccine: Received Pneumococcal Vaccination: Yes Received Pneumococcal Vaccination On or After 60th Birthday: Yes Implantable Devices No devices added Hospitalization / Surgery History Type of Hospitalization/Surgery surgery toe right hip replacement 12/2018 heart cath 12/17/2021 Family and Social History Cancer: Yes - Maternal Grandparents; Diabetes: Yes - Siblings; Heart Disease: Yes - Maternal Grandparents,Paternal Grandparents,Mother,Father,Siblings; Hereditary Spherocytosis: No; Hypertension: Yes -  Mother,Father,Siblings; Kidney Disease: No; Lung Disease: No; Seizures: No; Stroke: Yes - Paternal Grandparents; Thyroid Problems: No; Tuberculosis: No; Never smoker; Marital Status - Married; Alcohol Use: Moderate; Drug Use: No History; Caffeine Use: Moderate; Financial Concerns: No; Food, Clothing or Shelter Needs: No; Support System Lacking: No; Transportation Concerns: No Electronic Signature(s) Signed: 11/03/2022 11:45:36 AM By: Geralyn Corwin DO Entered By: Geralyn Corwin on 11/03/2022 11:42:21 [name] ([id number]) [id number] Page 11 of 11 -------------------------------------------------------------------------------- SuperBill Details Patient Name: Date of Service: [name], DO [name] 11/03/2022 Medical Record Number: [id number] Patient Account Number: [id number] Date of Birth/Sex: Treating RN: January 21, 1946 (77 y.o. 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 cm) enter 12qty Modifier: Quantity: 12 : CPT4 Code: 84696295 Description: 15271 - SKIN SUB GRAFT TRNK/ARM/LEG ICD-10 Diagnosis Description I87.312 Chronic venous hypertension (idiopathic) with ulcer of left lower extremity L97.828 Non-pressure chronic ulcer of other part of left lower leg with other specified s Modifier: everity Quantity: 1 Physician Procedures : CPT4 Code Description Modifier [id number] - WC PHYS SKIN SUB GRAFT TRNK/ARM/LEG ICD-10  Diagnosis Description I87.312 Chronic venous hypertension (idiopathic) with ulcer of left lower extremity L97.828 Non-pressure chronic ulcer of other part of left  lower leg with other specified severity Quantity: 1 Electronic Signature(s) Signed: 11/03/2022 11:45:36 AM By: Geralyn Corwin DO Entered By: Geralyn Corwin on 11/03/2022 11:44:35

## 2022-11-04 NOTE — Progress Notes (Signed)
Hailey, [name] ([id number]) [id number] Page 1 of 7 Visit Report for 11/03/2022 Arrival Information Details Patient Name: Date of Service: [name], [name] 11/03/2022 11:00 A M Medical Record Number: [id number] Patient Account Number: [id number] Date of Birth/Sex: Treating RN: 1946/07/02 (77 y.o. F) Primary Care Jeanae Whitmill: Burton Apley Other Clinician: Referring Neno Hohensee: Treating Taquila Leys/Extender: Cherre Blanc in Treatment: 21 Visit Information History Since Last Visit Added or deleted any medications: No Patient Arrived: Wheel Chair Any new allergies or adverse reactions: No Arrival Time: 10:53 Had a fall or experienced change in No Accompanied By: husband activities of daily living that may affect Transfer Assistance: None risk of falls: Patient Identification Verified: Yes Signs or symptoms of abuse/neglect since last visito No Secondary Verification Process Completed: Yes Hospitalized since last visit: No Patient Requires Transmission-Based Precautions: No Implantable device outside of the clinic excluding No Patient Has Alerts: Yes cellular tissue based products placed in the center Patient Alerts: ABI's: L: N/C 11/23 since last visit: Has Dressing in Place as Prescribed: Yes Pain Present Now: No Electronic Signature(s) Signed: 11/04/2022 11:38:03 AM By: Thayer [name] Entered By: Thayer [name] on 11/03/2022 10:58:55 -------------------------------------------------------------------------------- Encounter Discharge Information Details Patient Name: Date of Service: [name], [name] 11/03/2022 11:00 A M Medical Record Number: [id number] Patient Account Number: [id number] Date of Birth/Sex: Treating RN: [birth year] (77 y.o. [name] Primary Care Geovonni Meyerhoff: Burton Apley Other Clinician: Referring Brittiney Dicostanzo: Treating Bessie Livingood/Extender: Cherre Blanc in Treatment: 21 Encounter  Discharge Information Items Post Procedure Vitals Discharge Condition: Stable Temperature (F): 98.1 Ambulatory Status: Wheelchair Pulse (bpm): 90 Discharge Destination: Home Respiratory Rate (breaths/min): 18 Transportation: Private Auto Blood Pressure (mmHg): 139/73 Accompanied By: spouse Schedule Follow-up Appointment: Yes Clinical Summary of Care: Patient Declined Electronic Signature(s) Signed: 11/03/2022 1:56:54 PM By: Brenton Grills Entered By: Brenton Grills on 11/03/2022 11:35:17 -------------------------------------------------------------------------------- Lower Extremity Assessment Details Patient Name: Date of Service: [name], [name] 11/03/2022 11:00 A M Medical Record Number: [id number] Patient Account Number: [id number] Date of Birth/Sex: Treating RN: May 02, 1946 (77 y.o. F) Primary Care Samah Lapiana: Burton Apley Other Clinician: Referring Yonah Tangeman: Treating Shaddai Shapley/Extender: Cherre Blanc in Treatment: 21 Edema Assessment Assessed: [name]: No] Franne Forts: No] C[LeftWANDRA, [name] ([id number])] [Right: [id number] Page 2 of 7] Edema: [Left: N] [Right: o] Calf Left: Right: Point of Measurement: 29 cm From Medial Instep 30.2 cm Ankle Left: Right: Point of Measurement: 9 cm From Medial Instep 23 cm Electronic Signature(s) Signed: 11/04/2022 11:38:03 AM By: Thayer [name] Entered By: Thayer [name] on 11/03/2022 11:00:05 -------------------------------------------------------------------------------- Multi Wound Chart Details Patient Name: Date of Service: [name], [name] 11/03/2022 11:00 A M Medical Record Number: [id number] Patient Account Number: [id number] Date of Birth/Sex: Treating RN: [birth year] (77 y.o. F) Primary Care Remo Kirschenmann: Burton Apley Other Clinician: Referring Noa Constante: Treating Migel Hannis/Extender: Cherre Blanc in Treatment: 21 Vital Signs Height(in):  61 Pulse(bpm): 90 Weight(lbs): 186 Blood Pressure(mmHg): 139/73 Body Mass Index(BMI): 35.1 Temperature(F): 98.1 Respiratory Rate(breaths/min): 18 [5:Photos:] [N/A:N/A] Left, Distal, Anterior Lower Leg N/A N/A Wound Location: Gradually Appeared N/A N/A Wounding Event: Cellulitis N/A N/A Primary Etiology: Asthma, Sleep Apnea, Hypertension, N/A N/A Comorbid History: Colitis, Rheumatoid Arthritis, Osteoarthritis 04/19/2022 N/A N/A Date Acquired: 21 N/A N/A Weeks of Treatment: Open N/A N/A Wound Status: No N/A N/A Wound Recurrence: 3.9x1.6x0.2 N/A N/A Measurements L x W x D (cm) 4.901 N/A N/A A (cm) : rea 0.98 N/A N/A Volume (  cm) : 68.50% N/A N/A % Reduction in A rea: 37.00% N/A N/A % Reduction in Volume: Full Thickness With Exposed Support N/A N/A Classification: Structures Medium N/A N/A Exudate A mount: Serosanguineous N/A N/A Exudate Type: red, brown N/A N/A Exudate Color: Distinct, outline attached N/A N/A Wound Margin: Large (67-100%) N/A N/A Granulation A mount: Pink, Pale, Hyper-granulation N/A N/A Granulation Quality: Small (1-33%) N/A N/A Necrotic A mount: Fat Layer (Subcutaneous Tissue): Yes N/A N/A Exposed Structures: Fascia: No Tendon: No Muscle: No Joint: No Bone: No Large (67-100%) N/A N/A Epithelialization: Debridement - Selective/Open Wound N/A N/A Debridement: Pre-procedure Verification/Time Out 11:18 N/A N/A [name], [name] ([id number]) [id number] Page 3 of 7 Taken: Lidocaine 4% Topical Solution N/A N/A Pain Control: Slough N/A N/A Tissue Debrided: Non-Viable Tissue N/A N/A Level: 6.24 N/A N/A Debridement A (sq cm): rea Curette N/A N/A Instrument: Minimum N/A N/A Bleeding: Pressure N/A N/A Hemostasis A chieved: 0 N/A N/A Procedural Pain: 0 N/A N/A Post Procedural Pain: Procedure was tolerated well N/A N/A Debridement Treatment Response: 3.9x1.6x0.2 N/A N/A Post Debridement Measurements  L x W x D (cm) 0.98 N/A N/A Post Debridement Volume: (cm) Excoriation: No N/A N/A Periwound Skin Texture: Induration: No Callus: No Crepitus: No Rash: No Scarring: No Maceration: No N/A N/A Periwound Skin Moisture: Dry/Scaly: No Atrophie Blanche: No N/A N/A Periwound Skin Color: Cyanosis: No Ecchymosis: No Erythema: No Hemosiderin Staining: No Mottled: No Pallor: No Rubor: No No Abnormality N/A N/A Temperature: Cellular or Tissue Based Product N/A N/A Procedures Performed: Debridement Treatment Notes Wound #5 (Lower Leg) Wound Laterality: Left, Anterior, Distal Cleanser Wound Cleanser Discharge Instruction: Cleanse the wound with wound cleanser prior to applying a clean dressing using gauze sponges, not tissue or cotton balls. Peri-Wound Care Topical Primary Dressing Puraply #12 Discharge Instruction: applied by Lakesha Levinson. Adaptic and steri-strips Discharge Instruction: Used to secure Puraply AM. Leave in place. Hailey not remove. Secondary Dressing Zetuvit Plus Silicone Border Dressing 5x5 (in/in) Discharge Instruction: Apply silicone border over primary dressing as directed. Secured With Compression Wrap Compression Stockings Facilities manager) Signed: 11/03/2022 11:45:36 AM By: Geralyn Corwin Hailey Entered By: Geralyn Corwin on 11/03/2022 11:41:17 -------------------------------------------------------------------------------- Multi-Disciplinary Care Plan Details Patient Name: Date of Service: [name], [name] 11/03/2022 11:00 A M Medical Record Number: [id number] Patient Account Number: [id number] Date of Birth/Sex: Treating RN: [birth year] (77 y.o. [name], [name] Primary Care Cyntia Staley: Burton Apley Other Clinician: Referring Chloie Loney: Treating Harolyn Cocker/Extender: Cherre Blanc in Treatment: [address], [place] Kathie Rhodes ([id number]) [id number] Page 4 of 7 Active Inactive Pain, Acute or  Chronic Nursing Diagnoses: Pain, acute or chronic: actual or potential Potential alteration in comfort, pain Goals: Patient will verbalize adequate pain control and receive pain control interventions during procedures as needed Date Initiated: 06/03/2022 Target Resolution Date: 11/18/2022 Goal Status: Active Patient/caregiver will verbalize comfort level met Date Initiated: 06/03/2022 Target Resolution Date: 11/18/2022 Goal Status: Active Interventions: Encourage patient to take pain medications as prescribed Provide education on pain management Treatment Activities: Administer pain control measures as ordered : 06/03/2022 Notes: Electronic Signature(s) Signed: 11/03/2022 2:41:21 PM By: Shawn Stall RN, BSN Entered By: Shawn Stall on 11/03/2022 11:13:01 -------------------------------------------------------------------------------- Pain Assessment Details Patient Name: Date of Service: [name], [name] 11/03/2022 11:00 A M Medical Record Number: [id number] Patient Account Number: [id number] Date of Birth/Sex: Treating RN: [birth year] (77 y.o. F) Primary Care Tayshaun Kroh: Burton Apley Other Clinician: Referring Shabria Egley: Treating Lakayla Barrington/Extender: Cherre Blanc in Treatment: 21 Active  Problems Location of Pain Severity and Description of Pain Patient Has Paino No Site Locations Pain Management and Medication Current Pain Management: Electronic Signature(s) Signed: 11/04/2022 11:38:03 AM By: Thayer [name] Entered By: Thayer [name] on 11/03/2022 10:59:46 [name] ([id number]) [id number] Page 5 of 7 -------------------------------------------------------------------------------- Patient/Caregiver Education Details Patient Name: Date of Service: [name], Hailey RO Leanne Lovely 4/18/2024andnbsp11:00 A M Medical Record Number: [id number] Patient Account Number: [id number] Date of Birth/Gender: Treating RN: [birth year] (77 y.o. Arta Silence Primary Care Physician: Burton Apley Other Clinician: Referring Physician: Treating Physician/Extender: Cherre Blanc in Treatment: 21 Education Assessment Education Provided To: Patient Education Topics Provided Wound/Skin Impairment: Handouts: Caring for Your Ulcer Methods: Explain/Verbal Responses: Reinforcements needed Electronic Signature(s) Signed: 11/03/2022 2:41:21 PM By: Shawn Stall RN, BSN Entered By: Shawn Stall on 11/03/2022 11:13:13 -------------------------------------------------------------------------------- Wound Assessment Details Patient Name: Date of Service: [name], [name] 11/03/2022 11:00 A M Medical Record Number: [id number] Patient Account Number: [id number] Date of Birth/Sex: Treating RN: [birth year] (77 y.o. F) Primary Care Nozomi Mettler: Burton Apley Other Clinician: Referring Kaylinn Dedic: Treating Tyriq Moragne/Extender: Cherre Blanc in Treatment: 21 Wound Status Wound Number: 5 Primary Cellulitis Etiology: Wound Location: Left, Distal, Anterior Lower Leg Wound Open Wounding Event: Gradually Appeared Status: Date Acquired: 04/19/2022 Comorbid Asthma, Sleep Apnea, Hypertension, Colitis, Rheumatoid Weeks Of Treatment: 21 History: Arthritis, Osteoarthritis Clustered Wound: No Photos Wound Measurements Length: (cm) 3.9 Width: (cm) 1.6 Depth: (cm) 0.2 Area: (cm) 4.901 Volume: (cm) 0.98 % Reduction in Area: 68.5% % Reduction in Volume: 37% Epithelialization: Large (67-100%) Tunneling: No Undermining: No Wound Description Classification: Full Thickness With Exposed Support AYLENE, ACOFF ([id number]) Wound Margin: Distinct, outline attached Exudate Amount: Medium Exudate Type: Serosanguineous Exudate Color: red, brown tructures Foul Odor After Cleansing: No [id number] Page 6 of 7 Slough/Fibrino Yes Wound Bed Granulation Amount: Large  (67-100%) Exposed Structure Granulation Quality: Pink, Pale, Hyper-granulation Fascia Exposed: No Necrotic Amount: Small (1-33%) Fat Layer (Subcutaneous Tissue) Exposed: Yes Necrotic Quality: Adherent Slough Tendon Exposed: No Muscle Exposed: No Joint Exposed: No Bone Exposed: No Periwound Skin Texture Texture Color No Abnormalities Noted: No No Abnormalities Noted: No Callus: No Atrophie Blanche: No Crepitus: No Cyanosis: No Excoriation: No Ecchymosis: No Induration: No Erythema: No Rash: No Hemosiderin Staining: No Scarring: No Mottled: No Pallor: No Moisture Rubor: No No Abnormalities Noted: No Dry / Scaly: No Temperature / Pain Maceration: No Temperature: No Abnormality Treatment Notes Wound #5 (Lower Leg) Wound Laterality: Left, Anterior, Distal Cleanser Wound Cleanser Discharge Instruction: Cleanse the wound with wound cleanser prior to applying a clean dressing using gauze sponges, not tissue or cotton balls. Peri-Wound Care Topical Primary Dressing Puraply #12 Discharge Instruction: applied by Genae Strine. Adaptic and steri-strips Discharge Instruction: Used to secure Puraply AM. Leave in place. Hailey not remove. Secondary Dressing Zetuvit Plus Silicone Border Dressing 5x5 (in/in) Discharge Instruction: Apply silicone border over primary dressing as directed. Secured With Compression Wrap Compression Stockings Facilities manager) Signed: 11/04/2022 11:38:03 AM By: Thayer [name] Entered By: Thayer [name] on 11/03/2022 11:04:29 -------------------------------------------------------------------------------- Vitals Details Patient Name: Date of Service: [name], [name] 11/03/2022 11:00 A M Medical Record Number: [id number] Patient Account Number: [id number] Date of Birth/Sex: Treating RN: 1945-08-13 (77 y.o. F) Primary Care Easten Maceachern: Burton Apley Other Clinician: Referring Evelina Lore: Treating Jaevon Paras/Extender: Cherre Blanc in Treatment: [address] JAME, SEELIG ([id number]) [id number] Page 7 of 7 Time Taken: 10:59 Temperature (F): 98.1  Height (in): 61 Pulse (bpm): 90 Weight (lbs): 186 Respiratory Rate (breaths/min): 18 Body Mass Index (BMI): 35.1 Blood Pressure (mmHg): 139/73 Reference Range: 80 - 120 mg / dl Electronic Signature(s) Signed: 11/04/2022 11:38:03 AM By: Thayer [name] Entered By: Thayer [name] on 11/03/2022 10:59:21

## 2022-11-04 NOTE — Telephone Encounter (Signed)
Returned call and spoke with pt who immediately put me on the phone with her husband. He states that he "usually just gives people my number because she's hard to get in touch with, she doesn't hear the phone".  Explained the situation to the husband and provided the phone number for [place]. Husband states that he will take care of it. Nothing further should be required at this time.

## 2022-11-07 ENCOUNTER — Other Ambulatory Visit: Payer: Self-pay | Admitting: Pharmacist

## 2022-11-07 DIAGNOSIS — J849 Interstitial pulmonary disease, unspecified: Secondary | ICD-10-CM

## 2022-11-07 MED ORDER — ESBRIET 801 MG PO TABS: 801.0000 mg | ORAL_TABLET | Freq: Three times a day (TID) | ORAL | 1 refills | Status: DC

## 2022-11-07 NOTE — Telephone Encounter (Signed)
Refill sent for ESBRIET to [facility] ([facility]) for Esbriet: [phone]  Dose: 801 mg three times daily  Last OV: 08/11/22 Provider: [name]  Next OV: 12/14/22  Chesley Mires, PharmD, MPH, BCPS Clinical Pharmacist (Rheumatology and Pulmonology)

## 2022-11-10 ENCOUNTER — Ambulatory Visit: Payer: Medicare Other | Admitting: Internal Medicine

## 2022-11-10 ENCOUNTER — Encounter (HOSPITAL_BASED_OUTPATIENT_CLINIC_OR_DEPARTMENT_OTHER): Payer: Medicare Other | Admitting: Internal Medicine

## 2022-11-10 DIAGNOSIS — L97828 Non-pressure chronic ulcer of other part of left lower leg with other specified severity: Secondary | ICD-10-CM | POA: Diagnosis not present

## 2022-11-10 DIAGNOSIS — I87312 Chronic venous hypertension (idiopathic) with ulcer of left lower extremity: Secondary | ICD-10-CM | POA: Diagnosis not present

## 2022-11-11 NOTE — Progress Notes (Signed)
[name], Hailey ([id number]) [id number] Page 1 of 6 Visit Report for 11/10/2022 Arrival Information Details Patient Name: Date of Service: Hailey Diamond, DO [name] 11/10/2022 11:00 A M Medical Record Number: [id number] Patient Account Number: [id number] Date of Birth/Sex: Treating RN: 11/24/1945 (77 y.o. [name], [name] Primary Care Chyanne Kohut: Burton Apley Other Clinician: Referring Ramel Tobon: Treating Uziah Sorter/Extender: Cherre Blanc in Treatment: 22 Visit Information History Since Last Visit Added or deleted any medications: No Patient Arrived: Wheel Chair Any new allergies or adverse reactions: No Arrival Time: 10:50 Had a fall or experienced change in No Accompanied By: husband activities of daily living that may affect Transfer Assistance: Manual risk of falls: Patient Identification Verified: Yes Signs or symptoms of abuse/neglect since last visito No Secondary Verification Process Completed: Yes Hospitalized since last visit: No Patient Requires Transmission-Based Precautions: No Implantable device outside of the clinic excluding No Patient Has Alerts: Yes cellular tissue based products placed in the center Patient Alerts: ABI's: L: N/C 11/23 since last visit: Has Dressing in Place as Prescribed: Yes Has Compression in Place as Prescribed: Yes Pain Present Now: No Electronic Signature(s) Signed: 11/10/2022 4:51:53 PM By: Shawn Stall RN, BSN Entered By: Shawn Stall on 11/10/2022 10:54:12 -------------------------------------------------------------------------------- Lower Extremity Assessment Details Patient Name: Date of Service: [name], DO [name] 11/10/2022 11:00 A M Medical Record Number: [id number] Patient Account Number: [id number] Date of Birth/Sex: Treating RN: 1945-09-11 (77 y.o. [name], [name] Primary Care Elchanan Bob: Burton Apley Other Clinician: Referring Monalisa Bayless: Treating Damonie Furney/Extender: Cherre Blanc in Treatment: 22 Edema Assessment Assessed: [name]: Yes] Franne Forts: No] Edema: [Left: N] [Right: o] Calf Left: Right: Point of Measurement: 29 cm From Medial Instep 30.2 cm Ankle Left: Right: Point of Measurement: 9 cm From Medial Instep 23 cm Vascular Assessment Pulses: Dorsalis Pedis Palpable: [Left:Yes] Electronic Signature(s) Signed: 11/10/2022 4:51:53 PM By: Shawn Stall RN, BSN Entered By: Shawn Stall on 11/10/2022 10:56:42 [name] ([id number]) [id number] Page 2 of 6 -------------------------------------------------------------------------------- Multi Wound Chart Details Patient Name: Date of Service: Hailey Diamond, DO [name] 11/10/2022 11:00 A M Medical Record Number: [id number] Patient Account Number: [id number] Date of Birth/Sex: Treating RN: July 28, 1945 (77 y.o. F) Primary Care Mouna Yager: Burton Apley Other Clinician: Referring Duwan Adrian: Treating Aspin Palomarez/Extender: Cherre Blanc in Treatment: 22 Vital Signs Height(in): 61 Pulse(bpm): 85 Weight(lbs): 186 Blood Pressure(mmHg): 152/79 Body Mass Index(BMI): 35.1 Temperature(F): 97.6 Respiratory Rate(breaths/min): 20 [5:Photos:] [N/A:N/A] Left, Distal, Anterior Lower Leg N/A N/A Wound Location: Gradually Appeared N/A N/A Wounding Event: Cellulitis N/A N/A Primary Etiology: Asthma, Sleep Apnea, Hypertension, N/A N/A Comorbid History: Colitis, Rheumatoid Arthritis, Osteoarthritis 04/19/2022 N/A N/A Date Acquired: 22 N/A N/A Weeks of Treatment: Open N/A N/A Wound Status: No N/A N/A Wound Recurrence: 3.9x1.2x0.2 N/A N/A Measurements L x W x D (cm) 3.676 N/A N/A A (cm) : rea 0.735 N/A N/A Volume (cm) : 76.40% N/A N/A % Reduction in A rea: 52.70% N/A N/A % Reduction in Volume: Full Thickness With Exposed Support N/A N/A Classification: Structures Medium N/A N/A Exudate A mount: Serosanguineous N/A N/A Exudate  Type: red, brown N/A N/A Exudate Color: Distinct, outline attached N/A N/A Wound Margin: Large (67-100%) N/A N/A Granulation A mount: Pink, Pale, Hyper-granulation N/A N/A Granulation Quality: Small (1-33%) N/A N/A Necrotic A mount: Fat Layer (Subcutaneous Tissue): Yes N/A N/A Exposed Structures: Fascia: No Tendon: No Muscle: No Joint: No Bone: No Large (67-100%) N/A N/A Epithelialization: Debridement - Excisional N/A N/A Debridement:  Pre-procedure Verification/Time Out 11:05 N/A N/A Taken: Lidocaine 5% topical ointment N/A N/A Pain Control: Subcutaneous, Slough N/A N/A Tissue Debrided: Skin/Subcutaneous Tissue N/A N/A Level: 3.67 N/A N/A Debridement A (sq cm): rea Curette N/A N/A Instrument: Minimum N/A N/A Bleeding: Pressure N/A N/A Hemostasis A chieved: 0 N/A N/A Procedural Pain: 0 N/A N/A Post Procedural Pain: Procedure was tolerated well N/A N/A Debridement Treatment Response: 3.9x1.2x0.2 N/A N/A Post Debridement Measurements L x W x D (cm) 0.735 N/A N/A Post Debridement Volume: (cm) Excoriation: No N/A N/A Periwound Skin Texture: Induration: No Callus: No Crepitus: No Rash: No Scarring: No Maceration: No N/A N/A [address] MALLY, GAVINA ([id number]) [id number] Page 3 of 6 Dry/Scaly: No Atrophie Blanche: No N/A N/A Periwound Skin Color: Cyanosis: No Ecchymosis: No Erythema: No Hemosiderin Staining: No Mottled: No Pallor: No Rubor: No No Abnormality N/A N/A Temperature: Cellular or Tissue Based Product N/A N/A Procedures Performed: Debridement Treatment Notes Electronic Signature(s) Signed: 11/10/2022 2:38:20 PM By: Geralyn Corwin DO Entered By: Geralyn Corwin on 11/10/2022 11:27:00 -------------------------------------------------------------------------------- Multi-Disciplinary Care Plan Details Patient Name: Date of Service: Hailey Diamond, DO [name] 11/10/2022 11:00 A M Medical Record  Number: [id number] Patient Account Number: [id number] Date of Birth/Sex: Treating RN: Dec 02, 1945 (77 y.o. [name], [name] Primary Care Adalay Azucena: Burton Apley Other Clinician: Referring Perfecto Purdy: Treating Tatia Petrucci/Extender: Cherre Blanc in Treatment: 22 Active Inactive Pain, Acute or Chronic Nursing Diagnoses: Pain, acute or chronic: actual or potential Potential alteration in comfort, pain Goals: Patient will verbalize adequate pain control and receive pain control interventions during procedures as needed Date Initiated: 06/03/2022 Target Resolution Date: 12/09/2022 Goal Status: Active Patient/caregiver will verbalize comfort level met Date Initiated: 06/03/2022 Target Resolution Date: 12/09/2022 Goal Status: Active Interventions: Encourage patient to take pain medications as prescribed Provide education on pain management Treatment Activities: Administer pain control measures as ordered : 06/03/2022 Notes: Electronic Signature(s) Signed: 11/10/2022 4:51:53 PM By: Shawn Stall RN, BSN Entered By: Shawn Stall on 11/10/2022 11:01:10 -------------------------------------------------------------------------------- Pain Assessment Details Patient Name: Date of Service: Hailey Diamond, DO [name] 11/10/2022 11:00 A M Medical Record Number: [id number] Patient Account Number: [id number] Date of Birth/Sex: Treating RN: 05/22/1946 (77 y.o. [name], [name] Primary Care Armaan Pond: Burton Apley Other Clinician: Referring Giabella Duhart: Treating Meggin Ola/Extender: Cherre Blanc in Treatment: [address] ARITA, SEVERTSON ([id number]) [id number] Page 4 of 6 Location of Pain Severity and Description of Pain Patient Has Paino No Site Locations Pain Management and Medication Current Pain Management: Electronic Signature(s) Signed: 11/10/2022 4:51:53 PM By: Shawn Stall RN, BSN Entered By: Shawn Stall on  11/10/2022 10:54:20 -------------------------------------------------------------------------------- Patient/Caregiver Education Details Patient Name: Date of Service: [name], DO [name] 4/25/2024andnbsp11:00 A M Medical Record Number: [id number] Patient Account Number: [id number] Date of Birth/Gender: Treating RN: 05-01-1946 (77 y.o. Arta Silence Primary Care Physician: Burton Apley Other Clinician: Referring Physician: Treating Physician/Extender: Cherre Blanc in Treatment: 22 Education Assessment Education Provided To: Patient Education Topics Provided Wound/Skin Impairment: Handouts: Caring for Your Ulcer Methods: Explain/Verbal Responses: Reinforcements needed Electronic Signature(s) Signed: 11/10/2022 4:51:53 PM By: Shawn Stall RN, BSN Entered By: Shawn Stall on 11/10/2022 11:01:23 -------------------------------------------------------------------------------- Wound Assessment Details Patient Name: Date of Service: Hailey Diamond, DO [name] 11/10/2022 11:00 A M Medical Record Number: [id number] Patient Account Number: [id number] Date of Birth/Sex: Treating RN: 1946/06/17 (77 y.o. [name], [name] Primary Care Shermaine Brigham: Burton Apley Other Clinician: Referring Henley Blyth: Treating Nevaeh Casillas/Extender: Mikey Bussing  [name], Ronal Fear in Treatment: 22 Wound Status Wound Number: 5 Primary Cellulitis Etiology: MURIAH, HARSHA ([id number]) [id number] Page 5 of 6 Etiology: Wound Location: Left, Distal, Anterior Lower Leg Wound Open Wounding Event: Gradually Appeared Status: Date Acquired: 04/19/2022 Comorbid Asthma, Sleep Apnea, Hypertension, Colitis, Rheumatoid Weeks Of Treatment: 22 History: Arthritis, Osteoarthritis Clustered Wound: No Photos Wound Measurements Length: (cm) 3.9 Width: (cm) 1.2 Depth: (cm) 0.2 Area: (cm) 3.676 Volume: (cm) 0.735 % Reduction in Area: 76.4% % Reduction in Volume:  52.7% Epithelialization: Large (67-100%) Tunneling: No Undermining: No Wound Description Classification: Full Thickness With Exposed Suppor Wound Margin: Distinct, outline attached Exudate Amount: Medium Exudate Type: Serosanguineous Exudate Color: red, brown t Structures Foul Odor After Cleansing: No Slough/Fibrino Yes Wound Bed Granulation Amount: Large (67-100%) Exposed Structure Granulation Quality: Pink, Pale, Hyper-granulation Fascia Exposed: No Necrotic Amount: Small (1-33%) Fat Layer (Subcutaneous Tissue) Exposed: Yes Necrotic Quality: Adherent Slough Tendon Exposed: No Muscle Exposed: No Joint Exposed: No Bone Exposed: No Periwound Skin Texture Texture Color No Abnormalities Noted: No No Abnormalities Noted: No Callus: No Atrophie Blanche: No Crepitus: No Cyanosis: No Excoriation: No Ecchymosis: No Induration: No Erythema: No Rash: No Hemosiderin Staining: No Scarring: No Mottled: No Pallor: No Moisture Rubor: No No Abnormalities Noted: No Dry / Scaly: No Temperature / Pain Maceration: No Temperature: No Abnormality Electronic Signature(s) Signed: 11/10/2022 4:51:53 PM By: Shawn Stall RN, BSN Entered By: Shawn Stall on 11/10/2022 11:00:23 -------------------------------------------------------------------------------- Vitals Details Patient Name: Date of Service: Hailey Diamond, DO [name] 11/10/2022 11:00 A M Medical Record Number: [id number] Patient Account Number: [id number] Date of Birth/Sex: Treating RN: [birth year] (77 y.o. [name], [name] Primary Care Etola Mull: Burton Apley Other Clinician: Referring Burk Hoctor: Treating Riggin Cuttino/Extender: Cherre Blanc in Treatment: [address] [place] ([id number]) [id number] Page 6 of 6 Vital Signs Time Taken: 10:57 Temperature (F): 97.6 Height (in): 61 Pulse (bpm): 85 Weight (lbs): 186 Respiratory Rate (breaths/min): 20 Body Mass Index (BMI):  35.1 Blood Pressure (mmHg): 152/79 Reference Range: 80 - 120 mg / dl Electronic Signature(s) Signed: 11/10/2022 4:51:53 PM By: Shawn Stall RN, BSN Entered By: Shawn Stall on 11/10/2022 11:00:32

## 2022-11-11 NOTE — Progress Notes (Signed)
Hailey, [name] ([id number]) [id number] Page 1 of 11 Visit Report for 11/10/2022 Chief Complaint Document Details Patient Name: Date of Service: [name], [name] 11/10/2022 11:00 A M Medical Record Number: [id number] Patient Account Number: [id number] Date of Birth/Sex: Treating RN: 27-Nov-1945 (78 y.o. F) Primary Care Provider: Burton Apley Other Clinician: Referring Provider: Treating Provider/Extender: Cherre Blanc in Treatment: 22 Information Obtained from: Patient Chief Complaint 06/03/2022; Left lower extremity wounds Electronic Signature(s) Signed: 11/10/2022 2:38:20 PM By: Geralyn Corwin Hailey Entered By: Geralyn Corwin on 11/10/2022 11:27:08 -------------------------------------------------------------------------------- Cellular or Tissue Based Product Details Patient Name: Date of Service: [name], [name] 11/10/2022 11:00 A M Medical Record Number: [id number] Patient Account Number: [id number] Date of Birth/Sex: Treating RN: [birth year] (77 y.o. [name], [name] Primary Care Provider: Burton Apley Other Clinician: Referring Provider: Treating Provider/Extender: Cherre Blanc in Treatment: 22 Cellular or Tissue Based Product Type Wound #5 Left,Distal,Anterior Lower Leg Applied to: Performed By: Physician Geralyn Corwin, Hailey Cellular or Tissue Based Product Type: Puraply AM Level of Consciousness (Pre-procedure): Awake and Alert Pre-procedure Verification/Time Out Yes - 11:10 Taken: Location: trunk / arms / legs Wound Size (sq cm): 4.68 Product Size (sq cm): 12 Waste Size (sq cm): 0 Amount of Product Applied (sq cm): 12 Instrument Used: Forceps, Scissors Lot #: [id number] Order #: 13 Expiration Date: 11/05/2024 Fenestrated: No Reconstituted: Yes Solution Type: Normal saline Solution Amount: 3 ml Lot #: [id number] Solution Expiration Date: 12/15/2024 Secured: Yes Secured  With: Steri-Strips, adaptic Dressing Applied: No Procedural Pain: 0 Post Procedural Pain: 0 Response to Treatment: Procedure was tolerated well Level of Consciousness (Post- Awake and Alert procedure): Post Procedure Diagnosis Same as Pre-procedure Electronic Signature(s) Signed: 11/10/2022 2:38:20 PM By: Geralyn Corwin Hailey Signed: 11/10/2022 4:51:53 PM By: Shawn Stall RN, BSN Entered By: Shawn Stall on 11/10/2022 11:12:45 [name] ([id number]) [id number] Page 2 of 11 -------------------------------------------------------------------------------- Debridement Details Patient Name: Date of Service: [name], [name] 11/10/2022 11:00 A M Medical Record Number: [id number] Patient Account Number: [id number] Date of Birth/Sex: Treating RN: Jul 11, 1946 (77 y.o. [name], [name] Primary Care Provider: Burton Apley Other Clinician: Referring Provider: Treating Provider/Extender: Cherre Blanc in Treatment: 22 Debridement Performed for Assessment: Wound #5 Left,Distal,Anterior Lower Leg Performed By: Physician Geralyn Corwin, Hailey Debridement Type: Debridement Level of Consciousness (Pre-procedure): Awake and Alert Pre-procedure Verification/Time Out Yes - 11:05 Taken: Start Time: 11:06 Pain Control: Lidocaine 5% topical ointment Percent of Wound Bed Debrided: 100% T Area Debrided (cm): otal 3.67 Tissue and other material debrided: Non-Viable, Slough, Subcutaneous, Skin: Dermis , Skin: Epidermis, Biofilm, Slough Level: Skin/Subcutaneous Tissue Debridement Description: Excisional Instrument: Curette Bleeding: Minimum Hemostasis Achieved: Pressure End Time: 11:09 Procedural Pain: 0 Post Procedural Pain: 0 Response to Treatment: Procedure was tolerated well Level of Consciousness (Post- Awake and Alert procedure): Post Debridement Measurements of Total Wound Length: (cm) 3.9 Width: (cm) 1.2 Depth: (cm)  0.2 Volume: (cm) 0.735 Character of Wound/Ulcer Post Debridement: Improved Post Procedure Diagnosis Same as Pre-procedure Electronic Signature(s) Signed: 11/10/2022 2:38:20 PM By: Geralyn Corwin Hailey Signed: 11/10/2022 4:51:53 PM By: Shawn Stall RN, BSN Entered By: Shawn Stall on 11/10/2022 11:11:15 -------------------------------------------------------------------------------- HPI Details Patient Name: Date of Service: [name], [name] 11/10/2022 11:00 A M Medical Record Number: [id number] Patient Account Number: [id number] Date of Birth/Sex: Treating RN: 1945-08-08 (77 y.o. F) Primary Care Provider: Burton Apley Other Clinician: Referring Provider: Treating Provider/Extender: Mikey Bussing  [name], [name] in Treatment: 22 History of Present Illness HPI Description: Patient presents today for initial evaluation and our clinic as a referral from the [facility] health system Department of orthopedics for evaluation and treatment of the wound to his his of the left foot at the base of the great toe. Currently the good news is the patient really does not have any significant pain which is excellent. She has been seen by Dr. Luciana Axe at [facility]: infectious disease clinic that is the regional Center for infectious disease. Subsequently the patient was cultured and positive for methicillin sensitive staph aureus. She has been on antibiotics including Cipro prior to surgery as will several days following surgery. She then had clindamycin which was changed to doxycycline. She took this for 10 days. Subsequently the pain had improved and there was no longer any possible draining from the wound according to the notes. The patient sedentary rate as well as C-reactive protein have returned to normal ranges. Currently per Dr. Ephriam Knuckles assessment there was one dehiscence with no sign of osteomyelitis. He therefore discontinue the antibiotics with the completion of the last three days  Of doxycycline. Other than that he just set her for a follow-up as needed. The patient does not have diabetes and is not a current smoker. At this time her treatment that was recommended by the surgeon was applying a Hydrocolloid dressing. Based on what I'm seeing at this point the patient actually has some Slough noted over the surface of the wound there really does not appear to be evidence of infection though I Hailey think she does require some sharp debridement to clear away the slough and help with appropriate wound healing. She does have areas of granulation buds noted. She may be a candidate for a skin substitute as well. We will see how things Hailey over the next period of time until follow-up. No fevers, chills, nausea, or vomiting noted at [facility] ([id number]) [id number] Page 3 of 11 this time. 06/13/18 on evaluation today patient actually appears to be doing better in regard to her toe ulcer. She's been using the Prisma on this region and that seems to have done extremely well for her. Fortunately there does not appear to be evidence of infection at this time which is good news. She did see her surgeon they were extremely pleased with the overall appearance of her wound. 06/27/18 on evaluation today patient actually appears to be doing very well in regard to her foot ulcer. She has been tolerating the dressing changes without complication which is great news. She did see her podiatrist they felt like everything is looking very nice as well and will please. 07/05/2018 surgical wound on the left foot. She appears to be doing well. Still surface debrided to remove however in general post debridement the wound bed looks quite healthy it is come down significantly in terms of dimensions using silver collagen. The patient describes pain when her foot is elevated either in bed at night [sometimes keeps her awake] or when is propped up on a foot rest. She  basically takes analgesics for this. She does not really describe claudication with activity however her activity is very limited by interstitial lung disease. Her ABIs initially in this clinic were noncompressible. She is not a diabetic 07/20/2018; surgical wound on the left foot. Wound actually looks quite a bit better than I remember seeing this. She is still describing pain with her leg elevated at night that she does not  get when the wound is supine. She does not really describe claudication but she is very limited by her pulmonary status. We have been using silver collagen. 1/17; the patient has been followed up by orthopedics and discharge. Her arterial studies were actually quite good and should not be contributing to any pain. Slight reduction in ABIs but otherwise normal. Her wound is closed. She saw Dr. Luciana Axe of infectious disease surrounding the surgery. By review of our notes she was not felt to have osteomyelitis. 06/03/2022 Ms. Kailee Essman is a 77 year old female with a past medical history of interstitial lung disease on chronic oxygen via nasal cannula, rheumatoid arthritis, chronic diastolic heart failure and OSA that presents to the clinic for a 1 to 75-month history of nonhealing ulcer to the left lower extremity. On 04/20/2022 she was admitted to the hospital for left lower extremity cellulitis. She required 8 days of IV vancomycin and Unasyn followed by a 14-day course of Augmentin and doxycycline. She has been using Silvadene cream to the area. She does not use compression therapy but does own compression stockings. She currently denies signs of infection. 11/27; patient presents for follow-up she is using Medihoney and Hydrofera Blue to the wound beds. She has been using her Tubigrip. She has no issues or complaints today. 12/4; patient presents for follow-up. She continues to use Medihoney and Hydrofera Blue to the wound beds. Several attempts have been made to schedule ABIs  with TBI's. We gave patient the number today to call to have these scheduled. She has been using Tubigrip. She has no issues or complaints today. 12/18; she continues to use Medihoney and Hydrofera Blue in general the surface of the wound is looking somewhat better this week and measurements are slightly smaller. She did have her arterial studies done these were noncompressible bilaterally however TBI on the right of 0.69 on the left 0.62. Waveforms on the right were triphasic on the left biphasic to triphasic. This does not suggest significant arterial disease to make healing wounds at this location difficult. 12/29; patient presents for follow-up. She has been using Medihoney and Hydrofera Blue to the wound bed. She has been using Tubigrip. 1/5; patient presents for follow-up. She is been using Medihoney and Hydrofera Blue to the wound bed along with Tubigrip. The Tubigrip was doubled at last clinic visit. There is been improvement in wound healing. 1/12; patient presents for follow-up. She has been using Medihoney and Hydrofera Blue to the wound bed along with Tubigrip. There continues to be improvement in wound healing. 1/19; patient presents for follow-up. She has been using Medihoney and Hydrofera Blue to the wound bed along with her juxta light compression daily. There is been improvement in wound measurements. Patient has no issues or complaints today. 1/25; patient presents for follow-up. She has been using blast X and collagen to the wound bed under juxta light compression daily. She has no issues or complaints today. There is been improvement in wound healing. 2/1; patient presents for follow-up. She has been using blast X and collagen to the wound bed under juxta light compression daily. She has no issues or complaints today. 2/9; patient presents for follow-up. She has been using Hydrofera Blue with blast X under compression wrap daily. She has been approved for a skin substitute and  patient would like to proceed with this. 2/15; patient presents for follow-up. PuraPly was placed in standard fashion to the large anterior leg wound. She has been using blast X and Hydrofera Blue to  the more proximal small wound. She has been using her juxta light compression daily. She has no issues or complaints today. 2/23; patient presents for follow-up. PuraPly was placed in standard fashion to the large anterior leg wound. The more proximal small wound is almost healed and they have been using blast X and Hydrofera Blue. She has been using her juxta light compression daily to the left lower extremity. There is been improvement in wound healing. 2/29; patient presents for follow-up. PuraPly was placed in standard fashion to the large anterior leg wound. The more proximal wound has healed. She is been using her juxta light compression daily. She has no issues or complaints today. 3/8; patient presents for follow-up. PuraPly has been used to the wound bed weekly patient has done well with this. She has been using her juxta light compression daily. She has no issues or complaints today. 3/15; patient presents for follow-up. We have been using PuraPly to the wound bed weekly. She has been using her juxta light compression daily. She has been doing well with this treatment. Her wound has improved. She has no issues or complaints today. 3/22; patient presents for follow-up. We have been using PuraPly to the wound bed weekly. She has been using her juxta lite compression. Overall more granulation tissue present today. Patient has no issues or complaints today. 3/29; patient presents for follow-up. We have been using PuraPly to the wound bed weekly. She has been using her juxta lite compression daily. The wound is smaller. 10/20/2022: The wound continues to contract. There is some slough accumulation in the deepest portion of the wound. No concern for infection. 4/12; patient presents for follow-up. We  have been placing PuraPly to the wound bed weekly. There has been improvement in wound healing. 4/18; patient presents for follow-up. We have been using PuraPly to the wound bed. There continues to be improvement in wound healing. She has no issues or complaints. 4/25; Patient presents for follow-up. We have been using PuraPly to the wound bed. The wound is smaller. Electronic Signature(s) Signed: 11/10/2022 2:38:20 PM By: Perrin Smack, Deno Etienne ([id number]) PM By: Geralyn Corwin Hailey [id number] Page 4 of 11 Signed: 11/10/2022 2:38:20 Entered By: Geralyn Corwin on 11/10/2022 11:27:45 -------------------------------------------------------------------------------- Physical Exam Details Patient Name: Date of Service: [name], [name] 11/10/2022 11:00 A M Medical Record Number: [id number] Patient Account Number: [id number] Date of Birth/Sex: Treating RN: Mar 26, 1946 (77 y.o. F) Primary Care Provider: Burton Apley Other Clinician: Referring Provider: Treating Provider/Extender: Cherre Blanc in Treatment: 22 Constitutional respirations regular, non-labored and within target range for patient.. Cardiovascular 2+ dorsalis pedis/posterior tibialis pulses. Psychiatric pleasant and cooperative. Notes T the right lower extremity there is an open wound with granulation tissue and scant slough accumulation. Epithelization occurring to the edges. No signs of o infection. Good edema control. Electronic Signature(s) Signed: 11/10/2022 2:38:20 PM By: Geralyn Corwin Hailey Entered By: Geralyn Corwin on 11/10/2022 11:28:23 -------------------------------------------------------------------------------- Physician Orders Details Patient Name: Date of Service: [name], [name] 11/10/2022 11:00 A M Medical Record Number: [id number] Patient Account Number: [id number] Date of Birth/Sex: Treating RN: Oct 30, 1945 (77 y.o. Arta Silence Primary Care Provider: Burton Apley Other Clinician: Referring Provider: Treating Provider/Extender: Cherre Blanc in Treatment: 22 Verbal / Phone Orders: No Diagnosis Coding ICD-10 Coding Code Description [phone] Non-pressure chronic ulcer of other part of left lower leg with other specified severity I87.312 Chronic venous hypertension (idiopathic) with ulcer  of left lower extremity J84.9 Interstitial pulmonary disease, unspecified M06.9 Rheumatoid arthritis, unspecified Follow-up Appointments ppointment in 1 week. - Dr Mikey Bussing - Rm #8 on 11/17/22 @ 11:00 Return A ppointment in 2 weeks. - Dr Mikey Bussing - Rm #7 overflow on 11/24/22 @ 1030 Return A Anesthetic (In clinic) Topical Lidocaine 5% applied to wound bed Cellular or Tissue Based Products Wound #5 Left,Distal,Anterior Lower Leg Cellular or Tissue Based Product Type: - 08/18/2022 insurance ran organogenesis- 100% covered. 08/26/2022 #1 Puraply AM applied. 09/01/2022 #2 Puraply AM applied. 09/09/2022 #3 Puraply AM applied to both wounds. 09/15/2022 #4 Puraply AM applied to wound. 09/23/2022 #5 Puraply AM applied to wound. 09/30/22 # 6 PUraply AM applied to wound. 10/07/22 #7 Puraply AM applied to wound. 10/14/2022 #8 Puraply AM applied to wound. 10/20/22 #9 Puraply AM applied to wound 10/28/2022 #11 Puraply AM applied to wound. 11/03/2022 #12 Puraply AM applied to wound. 11/10/2022 #13 Puraply AM applied to wound. [name], [name] ([id number]) [id number] Page 5 of 11 Cellular or Tissue Based Product applied to wound bed, secured with steri-strips, cover with Adaptic or Mepitel. (Hailey NOT REMOVE). Bathing/ Shower/ Hygiene May shower with protection but Hailey not get wound dressing(s) wet. Protect dressing(s) with water repellant cover (for example, large plastic bag) or a cast cover and may then take shower. Edema Control - Lymphedema / SCD / Other Elevate legs to the level of the heart or  above for 30 minutes daily and/or when sitting for 3-4 times a day throughout the day. Avoid standing for long periods of time. Exercise regularly Compression stocking or Garment 20-30 mm/Hg pressure to: - start wearing the juxtalite HD apply in the morning and remove at night left leg. Non Wound Condition Protect area with: - buttock area with AandD ointment daily Wound Treatment Wound #5 - Lower Leg Wound Laterality: Left, Anterior, Distal Cleanser: Wound Cleanser (Generic) 1 x Per Week/30 Days Discharge Instructions: Cleanse the wound with wound cleanser prior to applying a clean dressing using gauze sponges, not tissue or cotton balls. Prim Dressing: Puraply #13 1 x Per Week/30 Days ary Discharge Instructions: applied by provider. Prim Dressing: Adaptic and steri-strips 1 x Per Week/30 Days ary Discharge Instructions: Used to secure Puraply AM. Leave in place. Hailey not remove. Secondary Dressing: Zetuvit Plus Silicone Border Dressing 5x5 (in/in) 1 x Per Week/30 Days Discharge Instructions: Apply silicone border over primary dressing as directed. Electronic Signature(s) Signed: 11/10/2022 2:38:20 PM By: Geralyn Corwin Hailey Entered By: Geralyn Corwin on 11/10/2022 11:28:57 -------------------------------------------------------------------------------- Problem List Details Patient Name: Date of Service: [name], [name] 11/10/2022 11:00 A M Medical Record Number: [id number] Patient Account Number: [id number] Date of Birth/Sex: Treating RN: 10-06-1945 (77 y.o. [name], [name] Primary Care Provider: Burton Apley Other Clinician: Referring Provider: Treating Provider/Extender: Cherre Blanc in Treatment: 22 Active Problems ICD-10 Encounter Code Description Active Date MDM Diagnosis L97.828 Non-pressure chronic ulcer of other part of left lower leg with other specified 06/03/2022 No Yes severity I87.312 Chronic venous hypertension (idiopathic) with  ulcer of left lower extremity 06/03/2022 No Yes J84.9 Interstitial pulmonary disease, unspecified 06/03/2022 No Yes M06.9 Rheumatoid arthritis, unspecified 06/03/2022 No Yes Inactive Problems Resolved Problems [name], [name] ([id number]) [id number] Page 6 of 11 Electronic Signature(s) Signed: 11/10/2022 2:38:20 PM By: Geralyn Corwin Hailey Entered By: Geralyn Corwin on 11/10/2022 11:26:56 -------------------------------------------------------------------------------- Progress Note Details Patient Name: Date of Service: [name], [name] 11/10/2022 11:00 A M Medical Record Number: [id number] Patient  Account Number: [id number] Date of Birth/Sex: Treating RN: [birth year] (77 y.o. F) Primary Care Provider: Burton Apley Other Clinician: Referring Provider: Treating Provider/Extender: Cherre Blanc in Treatment: 22 Subjective Chief Complaint Information obtained from Patient 06/03/2022; Left lower extremity wounds History of Present Illness (HPI) Patient presents today for initial evaluation and our clinic as a referral from the [facility] health system Department of orthopedics for evaluation and treatment of the wound to his his of the left foot at the base of the great toe. Currently the good news is the patient really does not have any significant pain which is excellent. She has been seen by Dr. Luciana Axe at [facility]: infectious disease clinic that is the regional Center for infectious disease. Subsequently the patient was cultured and positive for methicillin sensitive staph aureus. She has been on antibiotics including Cipro prior to surgery as will several days following surgery. She then had clindamycin which was changed to doxycycline. She took this for 10 days. Subsequently the pain had improved and there was no longer any possible draining from the wound according to the notes. The patient sedentary rate as well as C-reactive  protein have returned to normal ranges. Currently per Dr. Ephriam Knuckles assessment there was one dehiscence with no sign of osteomyelitis. He therefore discontinue the antibiotics with the completion of the last three days Of doxycycline. Other than that he just set her for a follow-up as needed. The patient does not have diabetes and is not a current smoker. At this time her treatment that was recommended by the surgeon was applying a Hydrocolloid dressing. Based on what I'm seeing at this point the patient actually has some Slough noted over the surface of the wound there really does not appear to be evidence of infection though I Hailey think she does require some sharp debridement to clear away the slough and help with appropriate wound healing. She does have areas of granulation buds noted. She may be a candidate for a skin substitute as well. We will see how things Hailey over the next period of time until follow-up. No fevers, chills, nausea, or vomiting noted at this time. 06/13/18 on evaluation today patient actually appears to be doing better in regard to her toe ulcer. She's been using the Prisma on this region and that seems to have done extremely well for her. Fortunately there does not appear to be evidence of infection at this time which is good news. She did see her surgeon they were extremely pleased with the overall appearance of her wound. 06/27/18 on evaluation today patient actually appears to be doing very well in regard to her foot ulcer. She has been tolerating the dressing changes without complication which is great news. She did see her podiatrist they felt like everything is looking very nice as well and will please. 07/05/2018 surgical wound on the left foot. She appears to be doing well. Still surface debrided to remove however in general post debridement the wound bed looks quite healthy it is come down significantly in terms of dimensions using silver collagen. The patient describes  pain when her foot is elevated either in bed at night [sometimes keeps her awake] or when is propped up on a foot rest. She basically takes analgesics for this. She does not really describe claudication with activity however her activity is very limited by interstitial lung disease. Her ABIs initially in this clinic were noncompressible. She is not a diabetic 07/20/2018; surgical wound on the left foot. Wound  actually looks quite a bit better than I remember seeing this. She is still describing pain with her leg elevated at night that she does not get when the wound is supine. She does not really describe claudication but she is very limited by her pulmonary status. We have been using silver collagen. 1/17; the patient has been followed up by orthopedics and discharge. Her arterial studies were actually quite good and should not be contributing to any pain. Slight reduction in ABIs but otherwise normal. Her wound is closed. She saw Dr. Luciana Axe of infectious disease surrounding the surgery. By review of our notes she was not felt to have osteomyelitis. 06/03/2022 Ms. [name] is a 77 year old female with a past medical history of interstitial lung disease on chronic oxygen via nasal cannula, rheumatoid arthritis, chronic diastolic heart failure and OSA that presents to the clinic for a 1 to 65-month history of nonhealing ulcer to the left lower extremity. On 04/20/2022 she was admitted to the hospital for left lower extremity cellulitis. She required 8 days of IV vancomycin and Unasyn followed by a 14-day course of Augmentin and doxycycline. She has been using Silvadene cream to the area. She does not use compression therapy but does own compression stockings. She currently denies signs of infection. 11/27; patient presents for follow-up she is using Medihoney and Hydrofera Blue to the wound beds. She has been using her Tubigrip. She has no issues or complaints today. 12/4; patient presents for  follow-up. She continues to use Medihoney and Hydrofera Blue to the wound beds. Several attempts have been made to schedule ABIs with TBI's. We gave patient the number today to call to have these scheduled. She has been using Tubigrip. She has no issues or complaints today. 12/18; she continues to use Medihoney and Hydrofera Blue in general the surface of the wound is looking somewhat better this week and measurements are slightly smaller. She did have her arterial studies done these were noncompressible bilaterally however TBI on the right of 0.69 on the left 0.62. Waveforms on the right were triphasic on the left biphasic to triphasic. This does not suggest significant arterial disease to make healing wounds at this location difficult. 12/29; patient presents for follow-up. She has been using Medihoney and Hydrofera Blue to the wound bed. She has been using Tubigrip. 1/5; patient presents for follow-up. She is been using Medihoney and Hydrofera Blue to the wound bed along with Tubigrip. The Tubigrip was doubled at last clinic visit. There is been improvement in wound healing. 1/12; patient presents for follow-up. She has been using Medihoney and Hydrofera Blue to the wound bed along with Tubigrip. There continues to be improvement in wound healing. 1/19; patient presents for follow-up. She has been using Medihoney and Hydrofera Blue to the wound bed along with her juxta light compression daily. There is been improvement in wound measurements. Patient has no issues or complaints today. 1/25; patient presents for follow-up. She has been using blast X and collagen to the wound bed under juxta light compression daily. She has no issues or complaints today. There is been improvement in wound healing. PERNELLA, ACKERLEY ([id number]) [id number] Page 7 of 11 2/1; patient presents for follow-up. She has been using blast X and collagen to the wound bed under juxta light compression  daily. She has no issues or complaints today. 2/9; patient presents for follow-up. She has been using Hydrofera Blue with blast X under compression wrap daily. She has been approved for a skin substitute  and patient would like to proceed with this. 2/15; patient presents for follow-up. PuraPly was placed in standard fashion to the large anterior leg wound. She has been using blast X and Hydrofera Blue to the more proximal small wound. She has been using her juxta light compression daily. She has no issues or complaints today. 2/23; patient presents for follow-up. PuraPly was placed in standard fashion to the large anterior leg wound. The more proximal small wound is almost healed and they have been using blast X and Hydrofera Blue. She has been using her juxta light compression daily to the left lower extremity. There is been improvement in wound healing. 2/29; patient presents for follow-up. PuraPly was placed in standard fashion to the large anterior leg wound. The more proximal wound has healed. She is been using her juxta light compression daily. She has no issues or complaints today. 3/8; patient presents for follow-up. PuraPly has been used to the wound bed weekly patient has done well with this. She has been using her juxta light compression daily. She has no issues or complaints today. 3/15; patient presents for follow-up. We have been using PuraPly to the wound bed weekly. She has been using her juxta light compression daily. She has been doing well with this treatment. Her wound has improved. She has no issues or complaints today. 3/22; patient presents for follow-up. We have been using PuraPly to the wound bed weekly. She has been using her juxta lite compression. Overall more granulation tissue present today. Patient has no issues or complaints today. 3/29; patient presents for follow-up. We have been using PuraPly to the wound bed weekly. She has been using her juxta lite compression  daily. The wound is smaller. 10/20/2022: The wound continues to contract. There is some slough accumulation in the deepest portion of the wound. No concern for infection. 4/12; patient presents for follow-up. We have been placing PuraPly to the wound bed weekly. There has been improvement in wound healing. 4/18; patient presents for follow-up. We have been using PuraPly to the wound bed. There continues to be improvement in wound healing. She has no issues or complaints. 4/25; Patient presents for follow-up. We have been using PuraPly to the wound bed. The wound is smaller. Patient History Information obtained from Patient. Family History Cancer - Maternal Grandparents, Diabetes - Siblings, Heart Disease - Maternal Grandparents,Paternal Grandparents,Mother,Father,Siblings, Hypertension - Mother,Father,Siblings, Stroke - Paternal Grandparents, No family history of Hereditary Spherocytosis, Kidney Disease, Lung Disease, Seizures, Thyroid Problems, Tuberculosis. Social History Never smoker, Marital Status - Married, Alcohol Use - Moderate, Drug Use - No History, Caffeine Use - Moderate. Medical History Eyes Denies history of Cataracts, Optic Neuritis Ear/Nose/Mouth/Throat Denies history of Chronic sinus problems/congestion Hematologic/Lymphatic Denies history of Anemia, Hemophilia, Human Immunodeficiency Virus, Lymphedema, Sickle Cell Disease Respiratory Patient has history of Asthma, Sleep Apnea Denies history of Aspiration, Chronic Obstructive Pulmonary Disease (COPD), Pneumothorax, Tuberculosis Cardiovascular Patient has history of Hypertension Denies history of Angina, Arrhythmia, Congestive Heart Failure, Coronary Artery Disease, Deep Vein Thrombosis, Hypotension, Myocardial Infarction, Peripheral Arterial Disease, Peripheral Venous Disease, Phlebitis, Vasculitis Gastrointestinal Patient has history of Colitis Denies history of Cirrhosis , Crohnoos, Hepatitis A, Hepatitis B, Hepatitis  C Endocrine Denies history of Type I Diabetes, Type II Diabetes Immunological Denies history of Lupus Erythematosus, Raynaudoos, Scleroderma Integumentary (Skin) Denies history of History of Burn Musculoskeletal Patient has history of Rheumatoid Arthritis, Osteoarthritis Denies history of Gout, Osteomyelitis Neurologic Denies history of Dementia, Neuropathy, Quadriplegia, Paraplegia, Seizure Disorder Oncologic Denies history of Received  Chemotherapy, Received Radiation Psychiatric Denies history of Anorexia/bulimia, Confinement Anxiety Hospitalization/Surgery History - surgery toe. - right hip replacement 12/2018. - heart cath 12/17/2021. Medical A Surgical History Notes nd Respiratory 4L [place] 02 pulmonary fibrosis [name], [name] ([id number]) [id number] Page 8 of 11 Objective Constitutional respirations regular, non-labored and within target range for patient.. Vitals Time Taken: 10:57 AM, Height: 61 in, Weight: 186 lbs, BMI: 35.1, Temperature: 97.6 F, Pulse: 85 bpm, Respiratory Rate: 20 breaths/min, Blood Pressure: 152/79 mmHg. Cardiovascular 2+ dorsalis pedis/posterior tibialis pulses. Psychiatric pleasant and cooperative. General Notes: T the right lower extremity there is an open wound with granulation tissue and scant slough accumulation. Epithelization occurring to the edges. o No signs of infection. Good edema control. Integumentary (Hair, Skin) Wound #5 status is Open. Original cause of wound was Gradually Appeared. The date acquired was: 04/19/2022. The wound has been in treatment 22 weeks. The wound is located on the [facility] Lower Leg. The wound measures 3.9cm length x 1.2cm width x 0.2cm depth; 3.676cm^2 area and 0.735cm^3 volume. There is Fat Layer (Subcutaneous Tissue) exposed. There is no tunneling or undermining noted. There is a medium amount of serosanguineous drainage noted. The wound margin is distinct with the outline  attached to the wound base. There is large (67-100%) pink, pale, hyper - granulation within the wound bed. There is a small (1-33%) amount of necrotic tissue within the wound bed including Adherent Slough. The periwound skin appearance did not exhibit: Callus, Crepitus, Excoriation, Induration, Rash, Scarring, Dry/Scaly, Maceration, Atrophie Blanche, Cyanosis, Ecchymosis, Hemosiderin Staining, Mottled, Pallor, Rubor, Erythema. Periwound temperature was noted as No Abnormality. 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 I debrided nonviable tissue. PuraPly was placed in standard fashion. I recommended continuing Velcro compression wrap daily. Follow-up in 1 week. Procedures Wound #5 Pre-procedure diagnosis of Wound #5 is a Cellulitis located on the Left,Distal,Anterior Lower Leg . There was a Excisional Skin/Subcutaneous Tissue Debridement with a total area of 3.67 sq cm performed by Geralyn Corwin, Hailey. With the following instrument(s): Curette to remove Non-Viable tissue/material. Material removed includes Subcutaneous Tissue, Slough, Skin: Dermis, Skin: Epidermis, and Biofilm after achieving pain control using Lidocaine 5% topical ointment. A time out was conducted at 11:05, prior to the start of the procedure. A Minimum amount of bleeding was controlled with Pressure. The procedure was tolerated well with a pain level of 0 throughout and a pain level of 0 following the procedure. Post Debridement Measurements: 3.9cm length x 1.2cm width x 0.2cm depth; 0.735cm^3 volume. Character of Wound/Ulcer Post Debridement is improved. Post procedure Diagnosis Wound #5: Same as Pre-Procedure Pre-procedure diagnosis of Wound #5 is a Cellulitis  located on the Left,Distal,Anterior Lower Leg. A skin graft procedure using a bioengineered skin substitute/cellular or tissue based product was performed by Geralyn Corwin, Hailey with the following instrument(s): Forceps and Scissors. Puraply AM was applied and secured with Steri-Strips and adaptic. 12 sq cm of product was utilized and 0 sq cm was wasted. Post Application, no dressing was applied. A Time Out was conducted at 11:10, prior to the start of the procedure. The procedure was tolerated well with a pain level of 0 throughout and a pain level of 0 following the procedure. Post procedure Diagnosis Wound #5: Same as Pre-Procedure . Plan Follow-up Appointments: Return Appointment in 1 week. - Dr  Merelin Human - Rm #8 on 11/17/22 @ 11:00 Return Appointment in 2 weeks. - Dr Mikey Bussing - Rm #7 overflow on 11/24/22 @ [address], [place] S ([id number]) [id number] Page 9 of 11 Anesthetic: (In clinic) Topical Lidocaine 5% applied to wound bed Cellular or Tissue Based Products: Wound #5 Left,Distal,Anterior Lower Leg: Cellular or Tissue Based Product Type: - 08/18/2022 insurance ran organogenesis- 100% covered. 08/26/2022 #1 Puraply AM applied. 09/01/2022 #2 Puraply AM applied. 09/09/2022 #3 Puraply AM applied to both wounds. 09/15/2022 #4 Puraply AM applied to wound. 09/23/2022 #5 Puraply AM applied to wound. 09/30/22 # 6 PUraply AM applied to wound. 10/07/22 #7 Puraply AM applied to wound. 10/14/2022 #8 Puraply AM applied to wound. 10/20/22 #9 Puraply AM applied to wound 10/28/2022 #11 Puraply AM applied to wound. 11/03/2022 #12 Puraply AM applied to wound. 11/10/2022 #13 Puraply AM applied to wound. Cellular or Tissue Based Product applied to wound bed, secured with steri-strips, cover with Adaptic or Mepitel. (Hailey NOT REMOVE). Bathing/ Shower/ Hygiene: May shower with protection but Hailey not get wound dressing(s) wet. Protect dressing(s) with water repellant cover (for example, large plastic bag)  or a cast cover and may then take shower. Edema Control - Lymphedema / SCD / Other: Elevate legs to the level of the heart or above for 30 minutes daily and/or when sitting for 3-4 times a day throughout the day. Avoid standing for long periods of time. Exercise regularly Compression stocking or Garment 20-30 mm/Hg pressure to: - start wearing the juxtalite HD apply in the morning and remove at night left leg. Non Wound Condition: Protect area with: - buttock area with AandD ointment daily WOUND #5: - Lower Leg Wound Laterality: Left, Anterior, Distal Cleanser: Wound Cleanser (Generic) 1 x Per Week/30 Days Discharge Instructions: Cleanse the wound with wound cleanser prior to applying a clean dressing using gauze sponges, not tissue or cotton balls. Prim Dressing: Puraply #13 1 x Per Week/30 Days ary Discharge Instructions: applied by provider. Prim Dressing: Adaptic and steri-strips 1 x Per Week/30 Days ary Discharge Instructions: Used to secure Puraply AM. Leave in place. Hailey not remove. Secondary Dressing: Zetuvit Plus Silicone Border Dressing 5x5 (in/in) 1 x Per Week/30 Days Discharge Instructions: Apply silicone border over primary dressing as directed. 1. In office sharp debridement 2. PuraPly placed in standard fashion 3. Follow-up in 1 week 4. Compression wrap daily Electronic Signature(s) Signed: 11/10/2022 2:38:20 PM By: Geralyn Corwin Hailey Entered By: Geralyn Corwin on 11/10/2022 11:30:40 -------------------------------------------------------------------------------- HxROS Details Patient Name: Date of Service: [name], [name] 11/10/2022 11:00 A M Medical Record Number: [id number] Patient Account Number: [id number] Date of Birth/Sex: Treating RN: Mar 22, 1946 (77 y.o. F) Primary Care Provider: Burton Apley Other Clinician: Referring Provider: Treating Provider/Extender: Cherre Blanc in Treatment: 22 Information Obtained  From Patient Eyes Medical History: Negative for: Cataracts; Optic Neuritis Ear/Nose/Mouth/Throat Medical History: Negative for: Chronic sinus problems/congestion Hematologic/Lymphatic Medical History: Negative for: Anemia; Hemophilia; Human Immunodeficiency Virus; Lymphedema; Sickle Cell Disease Respiratory Medical History: Positive for: Asthma; Sleep Apnea Negative for: Aspiration; Chronic Obstructive Pulmonary Disease (COPD); Pneumothorax; Tuberculosis Past Medical History Notes: 4L [place] 02 pulmonary fibrosis [name], [name] ([id number]) [id number] Page 10 of 11 Cardiovascular Medical History: Positive for: Hypertension Negative for: Angina; Arrhythmia; Congestive Heart Failure; Coronary Artery Disease; Deep Vein Thrombosis; Hypotension; Myocardial Infarction; Peripheral Arterial Disease; Peripheral Venous Disease; Phlebitis; Vasculitis Gastrointestinal Medical History: Positive for: Colitis Negative for: Cirrhosis ; Crohns; Hepatitis A; Hepatitis B; Hepatitis C Endocrine Medical History: Negative  for: Type I Diabetes; Type II Diabetes Immunological Medical History: Negative for: Lupus Erythematosus; Raynauds; Scleroderma Integumentary (Skin) Medical History: Negative for: History of Burn Musculoskeletal Medical History: Positive for: Rheumatoid Arthritis; Osteoarthritis Negative for: Gout; Osteomyelitis Neurologic Medical History: Negative for: Dementia; Neuropathy; Quadriplegia; Paraplegia; Seizure Disorder Oncologic Medical History: Negative for: Received Chemotherapy; Received Radiation Psychiatric Medical History: Negative for: Anorexia/bulimia; Confinement Anxiety Immunizations Pneumococcal Vaccine: Received Pneumococcal Vaccination: Yes Received Pneumococcal Vaccination On or After 60th Birthday: Yes Implantable Devices No devices added Hospitalization / Surgery History Type of Hospitalization/Surgery surgery toe right hip  replacement 12/2018 heart cath 12/17/2021 Family and Social History Cancer: Yes - Maternal Grandparents; Diabetes: Yes - Siblings; Heart Disease: Yes - Maternal Grandparents,Paternal Grandparents,Mother,Father,Siblings; Hereditary Spherocytosis: No; Hypertension: Yes - Mother,Father,Siblings; Kidney Disease: No; Lung Disease: No; Seizures: No; Stroke: Yes - Paternal Grandparents; Thyroid Problems: No; Tuberculosis: No; Never smoker; Marital Status - Married; Alcohol Use: Moderate; Drug Use: No History; Caffeine Use: Moderate; Financial Concerns: No; Food, Clothing or Shelter Needs: No; Support System Lacking: No; Transportation Concerns: No Electronic Signature(s) Signed: 11/10/2022 2:38:20 PM By: Geralyn Corwin Hailey Entered By: Geralyn Corwin on 11/10/2022 11:27:52 [name] ([id number]) [id number] Page 11 of 11 -------------------------------------------------------------------------------- SuperBill Details Patient Name: Date of Service: [name], Hailey RO [place] S. 11/10/2022 Medical Record Number: [id number] Patient Account Number: [id number] Date of Birth/Sex: Treating RN: 1946/04/24 (77 y.o. [name], [name] Primary Care Provider: Burton Apley Other Clinician: Referring Provider: Treating Provider/Extender: Cherre Blanc in Treatment: 22 Diagnosis Coding ICD-10 Codes Code Description [phone] Non-pressure chronic ulcer of other part of left lower leg with other specified severity I87.312 Chronic venous hypertension (idiopathic) with ulcer of left lower extremity J84.9 Interstitial pulmonary disease, unspecified M06.9 Rheumatoid arthritis, unspecified Facility Procedures : CPT4 Code: [id number] Description: Q4196 PuraPly AM 3X4 (12sq. cm) enter 12qty Modifier: Quantity: 12 : CPT4 Code: [id number] Description: 15271 - SKIN SUB GRAFT TRNK/ARM/LEG ICD-10 Diagnosis Description L97.828 Non-pressure chronic ulcer of other part of left  lower leg with other specified s I87.312 Chronic venous hypertension (idiopathic) with ulcer of left lower extremity Modifier: everity Quantity: 1 Physician Procedures : CPT4 Code Description Modifier [id number] 15271 - WC PHYS SKIN SUB GRAFT TRNK/ARM/LEG ICD-10 Diagnosis Description L97.828 Non-pressure chronic ulcer of other part of left lower leg with other specified severity I87.312 Chronic venous hypertension  (idiopathic) with ulcer of left lower extremity Quantity: 1 Electronic Signature(s) Signed: 11/10/2022 2:38:20 PM By: Geralyn Corwin Hailey Entered By: Geralyn Corwin on 11/10/2022 11:31:09

## 2022-11-17 ENCOUNTER — Encounter (HOSPITAL_BASED_OUTPATIENT_CLINIC_OR_DEPARTMENT_OTHER): Payer: Medicare Other | Attending: Internal Medicine | Admitting: Internal Medicine

## 2022-11-17 DIAGNOSIS — Z9981 Dependence on supplemental oxygen: Secondary | ICD-10-CM | POA: Diagnosis not present

## 2022-11-17 DIAGNOSIS — M069 Rheumatoid arthritis, unspecified: Secondary | ICD-10-CM | POA: Diagnosis not present

## 2022-11-17 DIAGNOSIS — L97829 Non-pressure chronic ulcer of other part of left lower leg with unspecified severity: Secondary | ICD-10-CM | POA: Diagnosis present

## 2022-11-17 DIAGNOSIS — I87312 Chronic venous hypertension (idiopathic) with ulcer of left lower extremity: Secondary | ICD-10-CM | POA: Diagnosis not present

## 2022-11-17 DIAGNOSIS — G4733 Obstructive sleep apnea (adult) (pediatric): Secondary | ICD-10-CM | POA: Diagnosis not present

## 2022-11-17 DIAGNOSIS — Z833 Family history of diabetes mellitus: Secondary | ICD-10-CM | POA: Insufficient documentation

## 2022-11-17 DIAGNOSIS — J849 Interstitial pulmonary disease, unspecified: Secondary | ICD-10-CM | POA: Insufficient documentation

## 2022-11-17 DIAGNOSIS — L97828 Non-pressure chronic ulcer of other part of left lower leg with other specified severity: Secondary | ICD-10-CM | POA: Diagnosis not present

## 2022-11-17 DIAGNOSIS — I110 Hypertensive heart disease with heart failure: Secondary | ICD-10-CM | POA: Insufficient documentation

## 2022-11-17 DIAGNOSIS — I5032 Chronic diastolic (congestive) heart failure: Secondary | ICD-10-CM | POA: Diagnosis not present

## 2022-11-22 ENCOUNTER — Other Ambulatory Visit: Payer: Self-pay | Admitting: Internal Medicine

## 2022-11-22 DIAGNOSIS — I2510 Atherosclerotic heart disease of native coronary artery without angina pectoris: Secondary | ICD-10-CM

## 2022-11-22 NOTE — Progress Notes (Signed)
[name] ([id number]) [id number] Page 1 of 11 Visit Report for 11/17/2022 Chief Complaint Document Details Patient Name: Date of Service: Hailey Diamond, DO [name] 11/17/2022 11:00 A M Medical Record Number: [id number] Patient Account Number: [id number] Date of Birth/Sex: Treating RN: 1945-10-11 (77 y.o. F) Primary Care Provider: Burton Apley Other Clinician: Referring Provider: Treating Provider/Extender: Cherre Blanc in Treatment: 23 Information Obtained from: Patient Chief Complaint 06/03/2022; Left lower extremity wounds Electronic Signature(s) Signed: 11/17/2022 11:49:34 AM By: Geralyn Corwin DO Entered By: Geralyn Corwin on 11/17/2022 11:43:18 -------------------------------------------------------------------------------- Cellular or Tissue Based Product Details Patient Name: Date of Service: Hailey Diamond, DO [name] 11/17/2022 11:00 A M Medical Record Number: [id number] Patient Account Number: [id number] Date of Birth/Sex: Treating RN: 1946-07-09 (77 y.o. [name], [name] Primary Care Provider: Burton Apley Other Clinician: Referring Provider: Treating Provider/Extender: Cherre Blanc in Treatment: 23 Cellular or Tissue Based Product Type Wound #5 Left,Distal,Anterior Lower Leg Applied to: Performed By: Physician Geralyn Corwin, DO Cellular or Tissue Based Product Type: Puraply AM Level of Consciousness (Pre-procedure): Awake and Alert Pre-procedure Verification/Time Out Yes - 11:38 Taken: Location: trunk / arms / legs Wound Size (sq cm): 4.32 Product Size (sq cm): 12 Waste Size (sq cm): 6 Waste Reason: wound size Amount of Product Applied (sq cm): 6 Instrument Used: Forceps, Scissors Lot #: [id number] Order #: [id number] Expiration Date: 11/05/2024 Fenestrated: No Reconstituted: Yes Solution Type: normal saline Solution Amount: 3mL Lot #: [id number] Solution Expiration Date:  12/15/2024 Secured: Yes Secured With: Steri-Strips, Mepitel Dressing Applied: No Procedural Pain: 0 Post Procedural Pain: 0 Response to Treatment: Procedure was tolerated well Level of Consciousness (Post- Awake and Alert procedure): Post Procedure Diagnosis Same as Pre-procedure Electronic Signature(s) Signed: 11/17/2022 11:49:34 AM By: Geralyn Corwin DO Signed: 11/21/2022 5:08:59 PM By: Shawn Stall RN, BSN [name] ([id number]) [id number] Page 2 of 11 Entered By: Shawn Stall on 11/17/2022 11:40:00 -------------------------------------------------------------------------------- HPI Details Patient Name: Date of Service: Hailey [name], DO [name] 11/17/2022 11:00 A M Medical Record Number: [id number] Patient Account Number: [id number] Date of Birth/Sex: Treating RN: 05/04/1946 (77 y.o. F) Primary Care Provider: Burton Apley Other Clinician: Referring Provider: Treating Provider/Extender: Cherre Blanc in Treatment: 23 History of Present Illness HPI Description: Patient presents today for initial evaluation and our clinic as a referral from the [facility] ([place] health system Department of orthopedics for evaluation and treatment of the wound to his his of the left foot at the base of the great toe. Currently the good news is the patient really does not have any significant pain which is excellent. She has been seen by Dr. Luciana Axe at [facility]: infectious disease clinic that is the regional Center for infectious disease. Subsequently the patient was cultured and positive for methicillin sensitive staph aureus. She has been on antibiotics including Cipro prior to surgery as will several days following surgery. She then had clindamycin which was changed to doxycycline. She took this for 10 days. Subsequently the pain had improved and there was no longer any possible draining from the wound according to the notes. The patient sedentary rate  as well as C-reactive protein have returned to normal ranges. Currently per Dr. Ephriam Knuckles assessment there was one dehiscence with no sign of osteomyelitis. He therefore discontinue the antibiotics with the completion of the last three days Of doxycycline. Other than that he just set her for a follow-up as needed. The patient does not have diabetes  and is not a current smoker. At this time her treatment that was recommended by the surgeon was applying a Hydrocolloid dressing. Based on what I'm seeing at this point the patient actually has some Slough noted over the surface of the wound there really does not appear to be evidence of infection though I do think she does require some sharp debridement to clear away the slough and help with appropriate wound healing. She does have areas of granulation buds noted. She may be a candidate for a skin substitute as well. We will see how things do over the next period of time until follow-up. No fevers, chills, nausea, or vomiting noted at this time. 06/13/18 on evaluation today patient actually appears to be doing better in regard to her toe ulcer. She's been using the Prisma on this region and that seems to have done extremely well for her. Fortunately there does not appear to be evidence of infection at this time which is good news. She did see her surgeon they were extremely pleased with the overall appearance of her wound. 06/27/18 on evaluation today patient actually appears to be doing very well in regard to her foot ulcer. She has been tolerating the dressing changes without complication which is great news. She did see her podiatrist they felt like everything is looking very nice as well and will please. 07/05/2018 surgical wound on the left foot. She appears to be doing well. Still surface debrided to remove however in general post debridement the wound bed looks quite healthy it is come down significantly in terms of dimensions using silver  collagen. The patient describes pain when her foot is elevated either in bed at night [sometimes keeps her awake] or when is propped up on a foot rest. She basically takes analgesics for this. She does not really describe claudication with activity however her activity is very limited by interstitial lung disease. Her ABIs initially in this clinic were noncompressible. She is not a diabetic 07/20/2018; surgical wound on the left foot. Wound actually looks quite a bit better than I remember seeing this. She is still describing pain with her leg elevated at night that she does not get when the wound is supine. She does not really describe claudication but she is very limited by her pulmonary status. We have been using silver collagen. 1/17; the patient has been followed up by orthopedics and discharge. Her arterial studies were actually quite good and should not be contributing to any pain. Slight reduction in ABIs but otherwise normal. Her wound is closed. She saw Dr. Luciana Axe of infectious disease surrounding the surgery. By review of our notes she was not felt to have osteomyelitis. 06/03/2022 [name] is a 77 year old female with a past medical history of interstitial lung disease on chronic oxygen via nasal cannula, rheumatoid arthritis, chronic diastolic heart failure and OSA that presents to the clinic for a 1 to 83-month history of nonhealing ulcer to the left lower extremity. On 04/20/2022 she was admitted to the hospital for left lower extremity cellulitis. She required 8 days of IV vancomycin and Unasyn followed by a 14-day course of Augmentin and doxycycline. She has been using Silvadene cream to the area. She does not use compression therapy but does own compression stockings. She currently denies signs of infection. 11/27; patient presents for follow-up she is using Medihoney and Hydrofera Blue to the wound beds. She has been using her Tubigrip. She has no issues or complaints  today. 12/4; patient presents  for follow-up. She continues to use Medihoney and Hydrofera Blue to the wound beds. Several attempts have been made to schedule ABIs with TBI's. We gave patient the number today to call to have these scheduled. She has been using Tubigrip. She has no issues or complaints today. 12/18; she continues to use Medihoney and Hydrofera Blue in general the surface of the wound is looking somewhat better this week and measurements are slightly smaller. She did have her arterial studies done these were noncompressible bilaterally however TBI on the right of 0.69 on the left 0.62. Waveforms on the right were triphasic on the left biphasic to triphasic. This does not suggest significant arterial disease to make healing wounds at this location difficult. 12/29; patient presents for follow-up. She has been using Medihoney and Hydrofera Blue to the wound bed. She has been using Tubigrip. 1/5; patient presents for follow-up. She is been using Medihoney and Hydrofera Blue to the wound bed along with Tubigrip. The Tubigrip was doubled at last clinic visit. There is been improvement in wound healing. 1/12; patient presents for follow-up. She has been using Medihoney and Hydrofera Blue to the wound bed along with Tubigrip. There continues to be improvement in wound healing. 1/19; patient presents for follow-up. She has been using Medihoney and Hydrofera Blue to the wound bed along with her juxta light compression daily. There is been improvement in wound measurements. Patient has no issues or complaints today. 1/25; patient presents for follow-up. She has been using blast X and collagen to the wound bed under juxta light compression daily. She has no issues or complaints today. There is been improvement in wound healing. 2/1; patient presents for follow-up. She has been using blast X and collagen to the wound bed under juxta light compression daily. She has no issues or complaints  today. 2/9; patient presents for follow-up. She has been using Hydrofera Blue with blast X under compression wrap daily. She has been approved for a skin substitute and patient would like to proceed with this. 2/15; patient presents for follow-up. PuraPly was placed in standard fashion to the large anterior leg wound. She has been using blast X and Hydrofera Blue to [place], JANEQUA BECKHAM ([id number]) [id number] Page 3 of 11 the more proximal small wound. She has been using her juxta light compression daily. She has no issues or complaints today. 2/23; patient presents for follow-up. PuraPly was placed in standard fashion to the large anterior leg wound. The more proximal small wound is almost healed and they have been using blast X and Hydrofera Blue. She has been using her juxta light compression daily to the left lower extremity. There is been improvement in wound healing. 2/29; patient presents for follow-up. PuraPly was placed in standard fashion to the large anterior leg wound. The more proximal wound has healed. She is been using her juxta light compression daily. She has no issues or complaints today. 3/8; patient presents for follow-up. PuraPly has been used to the wound bed weekly patient has done well with this. She has been using her juxta light compression daily. She has no issues or complaints today. 3/15; patient presents for follow-up. We have been using PuraPly to the wound bed weekly. She has been using her juxta light compression daily. She has been doing well with this treatment. Her wound has improved. She has no issues or complaints today. 3/22; patient presents for follow-up. We have been using PuraPly to the wound bed weekly. She has been using her juxta lite  compression. Overall more granulation tissue present today. Patient has no issues or complaints today. 3/29; patient presents for follow-up. We have been using PuraPly to the wound bed weekly. She  has been using her juxta lite compression daily. The wound is smaller. 10/20/2022: The wound continues to contract. There is some slough accumulation in the deepest portion of the wound. No concern for infection. 4/12; patient presents for follow-up. We have been placing PuraPly to the wound bed weekly. There has been improvement in wound healing. 4/18; patient presents for follow-up. We have been using PuraPly to the wound bed. There continues to be improvement in wound healing. She has no issues or complaints. 4/25; Patient presents for follow-up. We have been using PuraPly to the wound bed. The wound is smaller. 5/2; patient presents for follow-up. We have been using PuraPly. The wound is smaller. Electronic Signature(s) Signed: 11/17/2022 11:49:34 AM By: Geralyn Corwin DO Entered By: Geralyn Corwin on 11/17/2022 11:43:36 -------------------------------------------------------------------------------- Chemical Cauterization Details Patient Name: Date of Service: Hailey Diamond, DO [name] 11/17/2022 11:00 A M Medical Record Number: [id number] Patient Account Number: [id number] Date of Birth/Sex: Treating RN: Feb 28, 1946 (77 y.o. Arta Silence Primary Care Provider: Burton Apley Other Clinician: Referring Provider: Treating Provider/Extender: Cherre Blanc in Treatment: 23 Procedure Performed for: Wound #5 Left,Distal,Anterior Lower Leg Performed By: Physician Geralyn Corwin, DO Post Procedure Diagnosis Same as Pre-procedure Notes silver nitrate used. Electronic Signature(s) Signed: 11/17/2022 11:49:34 AM By: Geralyn Corwin DO Signed: 11/21/2022 5:08:59 PM By: Shawn Stall RN, BSN Entered By: Shawn Stall on 11/17/2022 11:38:42 -------------------------------------------------------------------------------- Physical Exam Details Patient Name: Date of Service: Hailey Diamond, DO [name] 11/17/2022 11:00 A M Medical Record Number: [id number] Patient Account Number:  [id number] Date of Birth/Sex: Treating RN: 1946/01/06 (77 y.o. F) Primary Care Provider: Burton Apley Other Clinician: Referring Provider: Treating Provider/Extender: Cherre Blanc in Treatment: 23 Constitutional respirations regular, non-labored and within target range for patient.Marland Kitchen [name], [name] ([id number]) [id number] Page 4 of 11 Cardiovascular 2+ dorsalis pedis/posterior tibialis pulses. Psychiatric pleasant and cooperative. Notes T the right lower extremity there is an open wound with hyper granulated tissue. Epithelization occurring to the edges circumferentially. Good edema control. No o signs of infection. Electronic Signature(s) Signed: 11/17/2022 11:49:34 AM By: Geralyn Corwin DO Entered By: Geralyn Corwin on 11/17/2022 11:44:20 -------------------------------------------------------------------------------- Physician Orders Details Patient Name: Date of Service: Hailey Diamond, DO [name] 11/17/2022 11:00 A M Medical Record Number: [id number] Patient Account Number: [id number] Date of Birth/Sex: Treating RN: 02-12-1946 (77 y.o. [name], [name] Primary Care Provider: Burton Apley Other Clinician: Referring Provider: Treating Provider/Extender: Cherre Blanc in Treatment: 23 Verbal / Phone Orders: No Diagnosis Coding ICD-10 Coding Code Description [id number] Non-pressure chronic ulcer of other part of left lower leg with other specified severity I87.312 Chronic venous hypertension (idiopathic) with ulcer of left lower extremity J84.9 Interstitial pulmonary disease, unspecified M06.9 Rheumatoid arthritis, unspecified Follow-up Appointments ppointment in 1 week. - Dr Mikey Bussing - Rm #7 on 11/24/22 @ 1030 Return A ppointment in 2 weeks. - Dr Mikey Bussing - Rm #7 on 12/01/22 @ 1030 Return A Anesthetic (In clinic) Topical Lidocaine 5% applied to wound bed Cellular or Tissue Based Products Wound #5  Left,Distal,Anterior Lower Leg Cellular or Tissue Based Product Type: - 08/18/2022 insurance ran organogenesis- 100% covered. 08/26/2022 #1 Puraply AM applied. 09/01/2022 #2 Puraply AM applied. 09/09/2022 #3 Puraply AM applied to both wounds. 09/15/2022 #4 Puraply AM applied to  wound. 09/23/2022 #5 Puraply AM applied to wound. 09/30/22 # 6 PUraply AM applied to wound. 10/07/22 #7 Puraply AM applied to wound. 10/14/2022 #8 Puraply AM applied to wound. 10/20/22 #9 Puraply AM applied to wound 10/28/2022 #11 Puraply AM applied to wound. 11/03/2022 #12 Puraply AM applied to wound. 11/10/2022 #13 Puraply AM applied to wound. 11/17/2022 #14 Puraply AM applied to wound bed. Cellular or Tissue Based Product applied to wound bed, secured with steri-strips, cover with Adaptic or Mepitel. (DO NOT REMOVE). Bathing/ Shower/ Hygiene May shower with protection but do not get wound dressing(s) wet. Protect dressing(s) with water repellant cover (for example, large plastic bag) or a cast cover and may then take shower. Edema Control - Lymphedema / SCD / Other Elevate legs to the level of the heart or above for 30 minutes daily and/or when sitting for 3-4 times a day throughout the day. Avoid standing for long periods of time. Exercise regularly Compression stocking or Garment 20-30 mm/Hg pressure to: - start wearing the juxtalite HD apply in the morning and remove at night left leg. Non Wound Condition Protect area with: - buttock area with AandD ointment daily Wound Treatment Wound #5 - Lower Leg Wound Laterality: Left, Anterior, Distal Cleanser: Wound Cleanser (Generic) 1 x Per Week/30 Days [name], [name] ([id number]) [id number] Page 5 of 11 Discharge Instructions: Cleanse the wound with wound cleanser prior to applying a clean dressing using gauze sponges, not tissue or cotton balls. Prim Dressing: Puraply #14 1 x Per Week/30 Days ary Discharge Instructions: applied by provider. Prim  Dressing: Adaptic and steri-strips 1 x Per Week/30 Days ary Discharge Instructions: Used to secure Puraply AM. Leave in place. Do not remove. Secondary Dressing: Zetuvit Plus Silicone Border Dressing 5x5 (in/in) 1 x Per Week/30 Days Discharge Instructions: Apply silicone border over primary dressing as directed. Electronic Signature(s) Signed: 11/17/2022 11:49:34 AM By: Geralyn Corwin DO Entered By: Geralyn Corwin on 11/17/2022 11:44:27 -------------------------------------------------------------------------------- Problem List Details Patient Name: Date of Service: Hailey Diamond, DO [name] 11/17/2022 11:00 A M Medical Record Number: [id number] Patient Account Number: [id number] Date of Birth/Sex: Treating RN: 10/08/1945 (77 y.o. [name], [name] Primary Care Provider: Burton Apley Other Clinician: Referring Provider: Treating Provider/Extender: Cherre Blanc in Treatment: 23 Active Problems ICD-10 Encounter Code Description Active Date MDM Diagnosis L97.828 Non-pressure chronic ulcer of other part of left lower leg with other specified 06/03/2022 No Yes severity I87.312 Chronic venous hypertension (idiopathic) with ulcer of left lower extremity 06/03/2022 No Yes J84.9 Interstitial pulmonary disease, unspecified 06/03/2022 No Yes M06.9 Rheumatoid arthritis, unspecified 06/03/2022 No Yes Inactive Problems Resolved Problems Electronic Signature(s) Signed: 11/17/2022 11:49:34 AM By: Geralyn Corwin DO Entered By: Geralyn Corwin on 11/17/2022 11:42:50 -------------------------------------------------------------------------------- Progress Note Details Patient Name: Date of Service: Hailey [name], DO [name] 11/17/2022 11:00 A M Medical Record Number: [id number] Patient Account Number: [id number] Date of Birth/Sex: Treating RN: Nov 22, 1945 (77 y.o. F) Primary Care Provider: Burton Apley Other Clinician: Referring Provider: Treating Provider/Extender: Cherre Blanc in Treatment: [address] [name] ([id number]) [id number] Page 6 of 11 Chief Complaint Information obtained from Patient 06/03/2022; Left lower extremity wounds History of Present Illness (HPI) Patient presents today for initial evaluation and our clinic as a referral from the [facility] Department of orthopedics for evaluation and treatment of the wound to his his of the left foot at the base of the great toe. Currently the good news is the patient  really does not have any significant pain which is excellent. She has been seen by Dr. Luciana Axe at [facility]: infectious disease clinic that is the regional Center for infectious disease. Subsequently the patient was cultured and positive for methicillin sensitive staph aureus. She has been on antibiotics including Cipro prior to surgery as will several days following surgery. She then had clindamycin which was changed to doxycycline. She took this for 10 days. Subsequently the pain had improved and there was no longer any possible draining from the wound according to the notes. The patient sedentary rate as well as C-reactive protein have returned to normal ranges. Currently per Dr. Ephriam Knuckles assessment there was one dehiscence with no sign of osteomyelitis. He therefore discontinue the antibiotics with the completion of the last three days Of doxycycline. Other than that he just set her for a follow-up as needed. The patient does not have diabetes and is not a current smoker. At this time her treatment that was recommended by the surgeon was applying a Hydrocolloid dressing. Based on what I'm seeing at this point the patient actually has some Slough noted over the surface of the wound there really does not appear to be evidence of infection though I do think she does require some sharp debridement to clear away the slough and help with appropriate wound healing. She does  have areas of granulation buds noted. She may be a candidate for a skin substitute as well. We will see how things do over the next period of time until follow-up. No fevers, chills, nausea, or vomiting noted at this time. 06/13/18 on evaluation today patient actually appears to be doing better in regard to her toe ulcer. She's been using the Prisma on this region and that seems to have done extremely well for her. Fortunately there does not appear to be evidence of infection at this time which is good news. She did see her surgeon they were extremely pleased with the overall appearance of her wound. 06/27/18 on evaluation today patient actually appears to be doing very well in regard to her foot ulcer. She has been tolerating the dressing changes without complication which is great news. She did see her podiatrist they felt like everything is looking very nice as well and will please. 07/05/2018 surgical wound on the left foot. She appears to be doing well. Still surface debrided to remove however in general post debridement the wound bed looks quite healthy it is come down significantly in terms of dimensions using silver collagen. The patient describes pain when her foot is elevated either in bed at night [sometimes keeps her awake] or when is propped up on a foot rest. She basically takes analgesics for this. She does not really describe claudication with activity however her activity is very limited by interstitial lung disease. Her ABIs initially in this clinic were noncompressible. She is not a diabetic 07/20/2018; surgical wound on the left foot. Wound actually looks quite a bit better than I remember seeing this. She is still describing pain with her leg elevated at night that she does not get when the wound is supine. She does not really describe claudication but she is very limited by her pulmonary status. We have been using silver collagen. 1/17; the patient has been followed up by  orthopedics and discharge. Her arterial studies were actually quite good and should not be contributing to any pain. Slight reduction in ABIs but otherwise normal. Her wound is closed. She saw Dr. Luciana Axe of infectious disease  surrounding the surgery. By review of our notes she was not felt to have osteomyelitis. 06/03/2022 Ms. Venise Lavallie is a 77 year old female with a past medical history of interstitial lung disease on chronic oxygen via nasal cannula, rheumatoid arthritis, chronic diastolic heart failure and OSA that presents to the clinic for a 1 to 11-month history of nonhealing ulcer to the left lower extremity. On 04/20/2022 she was admitted to the hospital for left lower extremity cellulitis. She required 8 days of IV vancomycin and Unasyn followed by a 14-day course of Augmentin and doxycycline. She has been using Silvadene cream to the area. She does not use compression therapy but does own compression stockings. She currently denies signs of infection. 11/27; patient presents for follow-up she is using Medihoney and Hydrofera Blue to the wound beds. She has been using her Tubigrip. She has no issues or complaints today. 12/4; patient presents for follow-up. She continues to use Medihoney and Hydrofera Blue to the wound beds. Several attempts have been made to schedule ABIs with TBI's. We gave patient the number today to call to have these scheduled. She has been using Tubigrip. She has no issues or complaints today. 12/18; she continues to use Medihoney and Hydrofera Blue in general the surface of the wound is looking somewhat better this week and measurements are slightly smaller. She did have her arterial studies done these were noncompressible bilaterally however TBI on the right of 0.69 on the left 0.62. Waveforms on the right were triphasic on the left biphasic to triphasic. This does not suggest significant arterial disease to make healing wounds at this location difficult. 12/29;  patient presents for follow-up. She has been using Medihoney and Hydrofera Blue to the wound bed. She has been using Tubigrip. 1/5; patient presents for follow-up. She is been using Medihoney and Hydrofera Blue to the wound bed along with Tubigrip. The Tubigrip was doubled at last clinic visit. There is been improvement in wound healing. 1/12; patient presents for follow-up. She has been using Medihoney and Hydrofera Blue to the wound bed along with Tubigrip. There continues to be improvement in wound healing. 1/19; patient presents for follow-up. She has been using Medihoney and Hydrofera Blue to the wound bed along with her juxta light compression daily. There is been improvement in wound measurements. Patient has no issues or complaints today. 1/25; patient presents for follow-up. She has been using blast X and collagen to the wound bed under juxta light compression daily. She has no issues or complaints today. There is been improvement in wound healing. 2/1; patient presents for follow-up. She has been using blast X and collagen to the wound bed under juxta light compression daily. She has no issues or complaints today. 2/9; patient presents for follow-up. She has been using Hydrofera Blue with blast X under compression wrap daily. She has been approved for a skin substitute and patient would like to proceed with this. 2/15; patient presents for follow-up. PuraPly was placed in standard fashion to the large anterior leg wound. She has been using blast X and Hydrofera Blue to the more proximal small wound. She has been using her juxta light compression daily. She has no issues or complaints today. 2/23; patient presents for follow-up. PuraPly was placed in standard fashion to the large anterior leg wound. The more proximal small wound is almost healed and they have been using blast X and Hydrofera Blue. She has been using her juxta light compression daily to the left lower extremity. There is  been improvement in wound healing. 2/29; patient presents for follow-up. PuraPly was placed in standard fashion to the large anterior leg wound. The more proximal wound has healed. She is been using her juxta light compression daily. She has no issues or complaints today. 3/8; patient presents for follow-up. PuraPly has been used to the wound bed weekly patient has done well with this. She has been using her juxta light compression daily. She has no issues or complaints today. 3/15; patient presents for follow-up. We have been using PuraPly to the wound bed weekly. She has been using her juxta light compression daily. She has been doing well with this treatment. Her wound has improved. She has no issues or complaints today. [name], [name] ([id number]) [id number] Page 7 of 11 3/22; patient presents for follow-up. We have been using PuraPly to the wound bed weekly. She has been using her juxta lite compression. Overall more granulation tissue present today. Patient has no issues or complaints today. 3/29; patient presents for follow-up. We have been using PuraPly to the wound bed weekly. She has been using her juxta lite compression daily. The wound is smaller. 10/20/2022: The wound continues to contract. There is some slough accumulation in the deepest portion of the wound. No concern for infection. 4/12; patient presents for follow-up. We have been placing PuraPly to the wound bed weekly. There has been improvement in wound healing. 4/18; patient presents for follow-up. We have been using PuraPly to the wound bed. There continues to be improvement in wound healing. She has no issues or complaints. 4/25; Patient presents for follow-up. We have been using PuraPly to the wound bed. The wound is smaller. 5/2; patient presents for follow-up. We have been using PuraPly. The wound is smaller. Patient History Information obtained from Patient. Family History Cancer - Maternal  Grandparents, Diabetes - Siblings, Heart Disease - Maternal Grandparents,Paternal Grandparents,Mother,Father,Siblings, Hypertension - Mother,Father,Siblings, Stroke - Paternal Grandparents, No family history of Hereditary Spherocytosis, Kidney Disease, Lung Disease, Seizures, Thyroid Problems, Tuberculosis. Social History Never smoker, Marital Status - Married, Alcohol Use - Moderate, Drug Use - No History, Caffeine Use - Moderate. Medical History Eyes Denies history of Cataracts, Optic Neuritis Ear/Nose/Mouth/Throat Denies history of Chronic sinus problems/congestion Hematologic/Lymphatic Denies history of Anemia, Hemophilia, Human Immunodeficiency Virus, Lymphedema, Sickle Cell Disease Respiratory Patient has history of Asthma, Sleep Apnea Denies history of Aspiration, Chronic Obstructive Pulmonary Disease (COPD), Pneumothorax, Tuberculosis Cardiovascular Patient has history of Hypertension Denies history of Angina, Arrhythmia, Congestive Heart Failure, Coronary Artery Disease, Deep Vein Thrombosis, Hypotension, Myocardial Infarction, Peripheral Arterial Disease, Peripheral Venous Disease, Phlebitis, Vasculitis Gastrointestinal Patient has history of Colitis Denies history of Cirrhosis , Crohnoos, Hepatitis A, Hepatitis B, Hepatitis C Endocrine Denies history of Type I Diabetes, Type II Diabetes Immunological Denies history of Lupus Erythematosus, Raynaudoos, Scleroderma Integumentary (Skin) Denies history of History of Burn Musculoskeletal Patient has history of Rheumatoid Arthritis, Osteoarthritis Denies history of Gout, Osteomyelitis Neurologic Denies history of Dementia, Neuropathy, Quadriplegia, Paraplegia, Seizure Disorder Oncologic Denies history of Received Chemotherapy, Received Radiation Psychiatric Denies history of Anorexia/bulimia, Confinement Anxiety Hospitalization/Surgery History - surgery toe. - right hip replacement 12/2018. - heart cath 12/17/2021. Medical A  Surgical History Notes nd Respiratory 4L [place] 02 pulmonary fibrosis Objective Constitutional respirations regular, non-labored and within target range for patient.. Vitals Time Taken: 11:24 AM, Height: 61 in, Weight: 186 lbs, BMI: 35.1, Temperature: 97.7 F, Pulse: 86 bpm, Respiratory Rate: 20 breaths/min, Blood Pressure: 153/73 mmHg. Cardiovascular 2+ dorsalis pedis/posterior tibialis pulses. [name], [name] ([id number])  [phone].pdf Page 8 of 11 Psychiatric pleasant and cooperative. General Notes: T the right lower extremity there is an open wound with hyper granulated tissue. Epithelization occurring to the edges circumferentially. Good o edema control. No signs of infection. Integumentary (Hair, Skin) Wound #5 status is Open. Original cause of wound was Gradually Appeared. The date acquired was: 04/19/2022. The wound has been in treatment 23 weeks. The wound is located on the [facility] Lower Leg. The wound measures 3.6cm length x 1.2cm width x 0.2cm depth; 3.393cm^2 area and 0.679cm^3 volume. There is Fat Layer (Subcutaneous Tissue) exposed. There is no tunneling or undermining noted. There is a medium amount of serosanguineous drainage noted. The wound margin is distinct with the outline attached to the wound base. There is large (67-100%) pink, pale, hyper - granulation within the wound bed. There is a small (1-33%) amount of necrotic tissue within the wound bed including Adherent Slough. The periwound skin appearance did not exhibit: Callus, Crepitus, Excoriation, Induration, Rash, Scarring, Dry/Scaly, Maceration, Atrophie Blanche, Cyanosis, Ecchymosis, Hemosiderin Staining, Mottled, Pallor, Rubor, Erythema. Periwound temperature was noted as No Abnormality. Assessment Active Problems ICD-10 Non-pressure chronic ulcer of other part of left lower leg with other specified severity Chronic venous hypertension (idiopathic) with ulcer of left lower  extremity Interstitial pulmonary disease, unspecified Rheumatoid arthritis, unspecified Patient's wound appears well-healing. PuraPly was placed in standard fashion. I used silver nitrate to the hyper granulated areas. Continue compression Velcro wrap daily. Follow-up in 1 week. Procedures Wound #5 Pre-procedure diagnosis of Wound #5 is a Cellulitis located on the Left,Distal,Anterior Lower Leg. A skin graft procedure using a bioengineered skin substitute/cellular or tissue based product was performed by Geralyn Corwin, DO with the following instrument(s): Forceps and Scissors. Puraply AM was applied and secured with Steri-Strips and Mepitel. 6 sq cm of product was utilized and 6 sq cm was wasted due to wound size. Post Application, no dressing was applied. A Time Out was conducted at 11:38, prior to the start of the procedure. The procedure was tolerated well with a pain level of 0 throughout and a pain level of 0 following the procedure. Post procedure Diagnosis Wound #5: Same as Pre-Procedure . Pre-procedure diagnosis of Wound #5 is a Cellulitis located on the Left,Distal,Anterior Lower Leg . An Chemical Cauterization procedure was performed by Geralyn Corwin, DO. Post procedure Diagnosis Wound #5: Same as Pre-Procedure Notes: silver nitrate used. Plan Follow-up Appointments: Return Appointment in 1 week. - Dr Mikey Bussing - Rm #7 on 11/24/22 @ 1030 Return Appointment in 2 weeks. - Dr Mikey Bussing - Rm #7 on 12/01/22 @ 1030 Anesthetic: (In clinic) Topical Lidocaine 5% applied to wound bed Cellular or Tissue Based Products: Wound #5 Left,Distal,Anterior Lower Leg: Cellular or Tissue Based Product Type: - 08/18/2022 insurance ran organogenesis- 100% covered. 08/26/2022 #1 Puraply AM applied. 09/01/2022 #2 Puraply AM applied. 09/09/2022 #3 Puraply AM applied to both wounds. 09/15/2022 #4 Puraply AM applied to wound. 09/23/2022 #5 Puraply AM applied to wound. 09/30/22 # 6 PUraply AM applied to wound.  10/07/22 #7 Puraply AM applied to wound. 10/14/2022 #8 Puraply AM applied to wound. 10/20/22 #9 Puraply AM applied to wound 10/28/2022 #11 Puraply AM applied to wound. 11/03/2022 #12 Puraply AM applied to wound. 11/10/2022 #13 Puraply AM applied to wound. 11/17/2022 #14 Puraply AM applied to wound bed. Cellular or Tissue Based Product applied to wound bed, secured with steri-strips, cover with Adaptic or Mepitel. (DO NOT REMOVE). Bathing/ Shower/ Hygiene: May shower with protection but do not get wound dressing(s)  wet. Protect dressing(s) with water repellant cover (for example, large plastic bag) or a cast cover and may then take shower. Edema Control - Lymphedema / SCD / Other: Elevate legs to the level of the heart or above for 30 minutes daily and/or when sitting for 3-4 times a day throughout the day. Avoid standing for long periods of time. Exercise regularly Compression stocking or Garment 20-30 mm/Hg pressure to: - start wearing the juxtalite HD apply in the morning and remove at night left leg. Non Wound Condition: Protect area with: - buttock area with AandD ointment daily WOUND #5: - Lower Leg Wound Laterality: Left, Anterior, Distal Cleanser: Wound Cleanser (Generic) 1 x Per Week/30 Days Discharge Instructions: Cleanse the wound with wound cleanser prior to applying a clean dressing using gauze sponges, not tissue or cotton balls. Prim Dressing: Puraply #14 1 x Per Week/30 Days ary Discharge Instructions: applied by provider. [name], [name] ([id number]) [id number] Page 9 of 11 Prim Dressing: Adaptic and steri-strips 1 x Per Week/30 Days ary Discharge Instructions: Used to secure Puraply AM. Leave in place. Do not remove. Secondary Dressing: Zetuvit Plus Silicone Border Dressing 5x5 (in/in) 1 x Per Week/30 Days Discharge Instructions: Apply silicone border over primary dressing as directed. 1. Silver nitrate 2. PuraPly placed in standard fashion 3.  Compression garment daily 4. Follow-up in 1 week Electronic Signature(s) Signed: 11/17/2022 11:49:34 AM By: Geralyn Corwin DO Entered By: Geralyn Corwin on 11/17/2022 11:45:10 -------------------------------------------------------------------------------- HxROS Details Patient Name: Date of Service: Hailey Diamond, DO [name] 11/17/2022 11:00 A M Medical Record Number: [id number] Patient Account Number: [id number] Date of Birth/Sex: Treating RN: 1945/11/07 (77 y.o. F) Primary Care Provider: Burton Apley Other Clinician: Referring Provider: Treating Provider/Extender: Cherre Blanc in Treatment: 23 Information Obtained From Patient Eyes Medical History: Negative for: Cataracts; Optic Neuritis Ear/Nose/Mouth/Throat Medical History: Negative for: Chronic sinus problems/congestion Hematologic/Lymphatic Medical History: Negative for: Anemia; Hemophilia; Human Immunodeficiency Virus; Lymphedema; Sickle Cell Disease Respiratory Medical History: Positive for: Asthma; Sleep Apnea Negative for: Aspiration; Chronic Obstructive Pulmonary Disease (COPD); Pneumothorax; Tuberculosis Past Medical History Notes: 4L [place] 02 pulmonary fibrosis Cardiovascular Medical History: Positive for: Hypertension Negative for: Angina; Arrhythmia; Congestive Heart Failure; Coronary Artery Disease; Deep Vein Thrombosis; Hypotension; Myocardial Infarction; Peripheral Arterial Disease; Peripheral Venous Disease; Phlebitis; Vasculitis Gastrointestinal Medical History: Positive for: Colitis Negative for: Cirrhosis ; Crohns; Hepatitis A; Hepatitis B; Hepatitis C Endocrine Medical History: Negative for: Type I Diabetes; Type II Diabetes Immunological Medical History: Negative for: Lupus Erythematosus; Raynauds; Scleroderma [name], [name] ([id number]) [id number] Page 10 of 11 Integumentary (Skin) Medical History: Negative for: History of  Burn Musculoskeletal Medical History: Positive for: Rheumatoid Arthritis; Osteoarthritis Negative for: Gout; Osteomyelitis Neurologic Medical History: Negative for: Dementia; Neuropathy; Quadriplegia; Paraplegia; Seizure Disorder Oncologic Medical History: Negative for: Received Chemotherapy; Received Radiation Psychiatric Medical History: Negative for: Anorexia/bulimia; Confinement Anxiety Immunizations Pneumococcal Vaccine: Received Pneumococcal Vaccination: Yes Received Pneumococcal Vaccination On or After 60th Birthday: Yes Implantable Devices No devices added Hospitalization / Surgery History Type of Hospitalization/Surgery surgery toe right hip replacement 12/2018 heart cath 12/17/2021 Family and Social History Cancer: Yes - Maternal Grandparents; Diabetes: Yes - Siblings; Heart Disease: Yes - Maternal Grandparents,Paternal Grandparents,Mother,Father,Siblings; Hereditary Spherocytosis: No; Hypertension: Yes - Mother,Father,Siblings; Kidney Disease: No; Lung Disease: No; Seizures: No; Stroke: Yes - Paternal Grandparents; Thyroid Problems: No; Tuberculosis: No; Never smoker; Marital Status - Married; Alcohol Use: Moderate; Drug Use: No History; Caffeine Use: Moderate; Financial Concerns: No; Food, Clothing or Shelter Needs: No; Support System  Lacking: No; Transportation Concerns: No Electronic Signature(s) Signed: 11/17/2022 11:49:34 AM By: Geralyn Corwin DO Entered By: Geralyn Corwin on 11/17/2022 11:43:45 -------------------------------------------------------------------------------- SuperBill Details Patient Name: Date of Service: Hailey [name], DO [name] 11/17/2022 Medical Record Number: [id number] Patient Account Number: [id number] Date of Birth/Sex: Treating RN: Aug 14, 1945 (77 y.o. [name], [name] Primary Care Provider: Burton Apley Other Clinician: Referring Provider: Treating Provider/Extender: Cherre Blanc in Treatment: 23 Diagnosis  Coding ICD-10 Codes Code Description [phone] Non-pressure chronic ulcer of other part of left lower leg with other specified severity I87.312 Chronic venous hypertension (idiopathic) with ulcer of left lower extremity J84.9 Interstitial pulmonary disease, unspecified M06.9 Rheumatoid arthritis, unspecified Facility Procedures ALMEE, LOCKLIN ([id number]): CPT4 Code Description [id number] PuraPly AM 3X4 (12sq. cm) enter 12qty ICD-10 Diagnosis Description L97.828 Non-pressure chronic ulcer of other part of left lower leg with ot I87.312 Chronic venous hypertension  (idiopathic) with ulcer of left lower [id number] Page 11 of 11: Modifier Quantity 12 her specified severity extremity TAELOR, VEEDER ([id number]): [id number] - SKIN SUB GRAFT TRNK/ARM/LEG ICD-10 Diagnosis Description L97.828 Non-pressure chronic ulcer of other part of left lower leg with ot I87.312 Chronic venous hypertension (idiopathic) with ulcer of left lower [id number] Page 11 of 11: 1 her specified severity extremity Physician Procedures : CPT4 Code Description Modifier [id number] - WC PHYS SKIN SUB GRAFT TRNK/ARM/LEG ICD-10 Diagnosis Description L97.828 Non-pressure chronic ulcer of other part of left lower leg with other specified severity I87.312 Chronic venous hypertension  (idiopathic) with ulcer of left lower extremity Quantity: 1 Electronic Signature(s) Signed: 11/17/2022 11:49:34 AM By: Geralyn Corwin DO Entered By: Geralyn Corwin on 11/17/2022 11:45:22

## 2022-11-22 NOTE — Progress Notes (Signed)
SHIZA, PROVENCAL ([id number]) [id number] Page 1 of 6 Visit Report for 11/17/2022 Arrival Information Details Patient Name: Date of Service: Hailey Diamond, DO [name] 11/17/2022 11:00 A M Medical Record Number: [id number] Patient Account Number: [id number] Date of Birth/Sex: Treating RN: Nov 27, 1945 (77 y.o. F) Primary Care Aydrian Halpin: Burton Apley Other Clinician: Referring Nuvia Hileman: Treating Hewitt Garner/Extender: Cherre Blanc in Treatment: 23 Visit Information History Since Last Visit Added or deleted any medications: No Patient Arrived: Wheel Chair Any new allergies or adverse reactions: No Arrival Time: 11:20 Had a fall or experienced change in No Accompanied By: husband activities of daily living that may affect Transfer Assistance: None risk of falls: Patient Identification Verified: Yes Hospitalized since last visit: No Secondary Verification Process Completed: Yes Implantable device outside of the clinic excluding No Patient Requires Transmission-Based Precautions: No cellular tissue based products placed in the center Patient Has Alerts: Yes since last visit: Patient Alerts: ABI's: L: N/C 11/23 Has Dressing in Place as Prescribed: Yes Has Compression in Place as Prescribed: Yes Pain Present Now: Yes Electronic Signature(s) Signed: 11/17/2022 3:54:44 PM By: Thayer [name] Entered By: Thayer [name] on 11/17/2022 11:24:05 -------------------------------------------------------------------------------- Encounter Discharge Information Details Patient Name: Date of Service: Hailey Diamond, DO [name] 11/17/2022 11:00 A M Medical Record Number: [id number] Patient Account Number: [id number] Date of Birth/Sex: Treating RN: August 14, 1945 (77 y.o. [name], [name] Primary Care Claudio Mondry: Burton Apley Other Clinician: Referring Ramez Arrona: Treating Tam Savoia/Extender: Cherre Blanc in Treatment: 23 Encounter Discharge  Information Items Discharge Condition: Stable Ambulatory Status: Wheelchair Discharge Destination: Home Transportation: Private Auto Accompanied By: husband Schedule Follow-up Appointment: Yes Clinical Summary of Care: Electronic Signature(s) Signed: 11/21/2022 5:08:59 PM By: Shawn Stall RN, BSN Entered By: Shawn Stall on 11/17/2022 11:32:32 -------------------------------------------------------------------------------- Lower Extremity Assessment Details Patient Name: Date of Service: [name], DO [name] 11/17/2022 11:00 A M Medical Record Number: [id number] Patient Account Number: [id number] Date of Birth/Sex: Treating RN: 10-25-1945 (77 y.o. F) Primary Care Jemmie Rhinehart: Burton Apley Other Clinician: Referring Caylen Kuwahara: Treating Dahiana Kulak/Extender: Cherre Blanc in Treatment: 23 Edema Assessment Assessed: [name]: No] Franne Forts: No] C[LeftNATHALYE, [name] ([id number])] [Right: [id number] Page 2 of 6] Edema: [Left: N] [Right: o] Calf Left: Right: Point of Measurement: 29 cm From Medial Instep 30.5 cm Ankle Left: Right: Point of Measurement: 9 cm From Medial Instep 21 cm Electronic Signature(s) Signed: 11/17/2022 3:54:44 PM By: Thayer [name] Entered By: Thayer [name] on 11/17/2022 11:25:00 -------------------------------------------------------------------------------- Multi Wound Chart Details Patient Name: Date of Service: Hailey Diamond, DO [name] 11/17/2022 11:00 A M Medical Record Number: [id number] Patient Account Number: [id number] Date of Birth/Sex: Treating RN: [birth year] (77 y.o. F) Primary Care Aviannah Castoro: Burton Apley Other Clinician: Referring Maddi Collar: Treating Rae Sutcliffe/Extender: Cherre Blanc in Treatment: 23 Vital Signs Height(in): 61 Pulse(bpm): 86 Weight(lbs): 186 Blood Pressure(mmHg): 153/73 Body Mass Index(BMI): 35.1 Temperature(F): 97.7 Respiratory Rate(breaths/min):  20 [5:Photos:] [N/A:N/A] Left, Distal, Anterior Lower Leg N/A N/A Wound Location: Gradually Appeared N/A N/A Wounding Event: Cellulitis N/A N/A Primary Etiology: Asthma, Sleep Apnea, Hypertension, N/A N/A Comorbid History: Colitis, Rheumatoid Arthritis, Osteoarthritis 04/19/2022 N/A N/A Date Acquired: 23 N/A N/A Weeks of Treatment: Open N/A N/A Wound Status: No N/A N/A Wound Recurrence: 3.6x1.2x0.2 N/A N/A Measurements L x W x D (cm) 3.393 N/A N/A A (cm) : rea 0.679 N/A N/A Volume (cm) : 78.20% N/A N/A % Reduction in Area: 56.30% N/A N/A % Reduction in Volume: Full Thickness With  Exposed Support N/A N/A Classification: Structures Medium N/A N/A Exudate Amount: Serosanguineous N/A N/A Exudate Type: red, brown N/A N/A Exudate Color: Distinct, outline attached N/A N/A Wound Margin: Large (67-100%) N/A N/A Granulation Amount: Pink, Pale, Hyper-granulation N/A N/A Granulation Quality: Small (1-33%) N/A N/A Necrotic Amount: Fat Layer (Subcutaneous Tissue): Yes N/A N/A Exposed Structures: Fascia: No Tendon: No Muscle: No Joint: No Bone: No Large (67-100%) N/A N/A Epithelialization: Excoriation: No N/A N/A Periwound Skin Texture: Induration: No [name], [name] ([id number]) [id number] Page 3 of 6 Callus: No Crepitus: No Rash: No Scarring: No Maceration: No N/A N/A Periwound Skin Moisture: Dry/Scaly: No Atrophie Blanche: No N/A N/A Periwound Skin Color: Cyanosis: No Ecchymosis: No Erythema: No Hemosiderin Staining: No Mottled: No Pallor: No Rubor: No No Abnormality N/A N/A Temperature: Cellular or Tissue Based Product N/A N/A Procedures Performed: Chemical Cauterization Treatment Notes Wound #5 (Lower Leg) Wound Laterality: Left, Anterior, Distal Cleanser Wound Cleanser Discharge Instruction: Cleanse the wound with wound cleanser prior to applying a clean dressing using gauze sponges, not tissue or cotton  balls. Peri-Wound Care Topical Primary Dressing Puraply #14 Discharge Instruction: applied by Teresina Bugaj. Adaptic and steri-strips Discharge Instruction: Used to secure Puraply AM. Leave in place. Do not remove. Secondary Dressing Zetuvit Plus Silicone Border Dressing 5x5 (in/in) Discharge Instruction: Apply silicone border over primary dressing as directed. Secured With Compression Wrap Compression Stockings Add-Ons Electronic Signature(s) Signed: 11/17/2022 11:49:34 AM By: Geralyn Corwin DO Entered By: Geralyn Corwin on 11/17/2022 11:43:11 -------------------------------------------------------------------------------- Multi-Disciplinary Care Plan Details Patient Name: Date of Service: Hailey Diamond, DO [name] 11/17/2022 11:00 A M Medical Record Number: [id number] Patient Account Number: [id number] Date of Birth/Sex: Treating RN: Feb 23, 1946 (77 y.o. [name], [name] Primary Care Jacey Pelc: Burton Apley Other Clinician: Referring Kalianna Verbeke: Treating Jarmar Rousseau/Extender: Cherre Blanc in Treatment: 23 Active Inactive Pain, Acute or Chronic Nursing Diagnoses: Pain, acute or chronic: actual or potential Potential alteration in comfort, pain Goals: Patient will verbalize adequate pain control and receive pain control interventions during procedures as needed Date Initiated: 06/03/2022 Target Resolution Date: 12/09/2022 [name], [name] ([id number]) [id number] Page 4 of 6 Goal Status: Active Patient/caregiver will verbalize comfort level met Date Initiated: 06/03/2022 Target Resolution Date: 12/09/2022 Goal Status: Active Interventions: Encourage patient to take pain medications as prescribed Provide education on pain management Treatment Activities: Administer pain control measures as ordered : 06/03/2022 Notes: Electronic Signature(s) Signed: 11/21/2022 5:08:59 PM By: Shawn Stall RN, BSN Entered By: Shawn Stall on 11/17/2022  11:30:23 -------------------------------------------------------------------------------- Pain Assessment Details Patient Name: Date of Service: Hailey Diamond, DO [name] 11/17/2022 11:00 A M Medical Record Number: [id number] Patient Account Number: [id number] Date of Birth/Sex: Treating RN: 1945-09-08 (77 y.o. F) Primary Care Danaka Llera: Burton Apley Other Clinician: Referring Righteous Claiborne: Treating Anet Logsdon/Extender: Cherre Blanc in Treatment: 23 Active Problems Location of Pain Severity and Description of Pain Patient Has Paino Yes Site Locations Pain Location: Generalized Pain Rate the pain. Current Pain Level: 3 Pain Management and Medication Current Pain Management: Electronic Signature(s) Signed: 11/17/2022 3:54:44 PM By: Thayer [name] Entered By: Thayer [name] on 11/17/2022 11:24:41 -------------------------------------------------------------------------------- Patient/Caregiver Education Details Patient Name: Date of Service: [name], DO [name] 5/2/2024andnbsp11:00 A M Medical Record Number: [id number] Patient Account Number: [id number] Date of Birth/Gender: Treating RN: [birth year] (77 y.o. [name], [name] Primary Care Physician: Burton Apley Other Clinician: Referring Physician: Treating Physician/Extender: Cherre Blanc in Treatment: [address] [place], [place] Kathie Rhodes ([id number]) [id number] Page 5  of 6 Education Provided To: Patient Education Topics Provided Wound/Skin Impairment: Handouts: Caring for Your Ulcer Methods: Explain/Verbal Responses: Reinforcements needed Electronic Signature(s) Signed: 11/21/2022 5:08:59 PM By: Shawn Stall RN, BSN Entered By: Shawn Stall on 11/17/2022 11:30:35 -------------------------------------------------------------------------------- Wound Assessment Details Patient Name: Date of Service: [name], DO [name] 11/17/2022 11:00 A M Medical  Record Number: [id number] Patient Account Number: [id number] Date of Birth/Sex: Treating RN: 08-31-1945 (77 y.o. F) Primary Care Munachimso Rigdon: Burton Apley Other Clinician: Referring Malaia Buchta: Treating Nakeya Adinolfi/Extender: Cherre Blanc in Treatment: 23 Wound Status Wound Number: 5 Primary Cellulitis Etiology: Wound Location: Left, Distal, Anterior Lower Leg Wound Open Wounding Event: Gradually Appeared Status: Date Acquired: 04/19/2022 Comorbid Asthma, Sleep Apnea, Hypertension, Colitis, Rheumatoid Weeks Of Treatment: 23 History: Arthritis, Osteoarthritis Clustered Wound: No Photos Wound Measurements Length: (cm) Width: (cm) Depth: (cm) Area: (cm) Volume: (cm) 3.6 % Reduction in Area: 78.2% 1.2 % Reduction in Volume: 56.3% 0.2 Epithelialization: Large (67-100%) 3.393 Tunneling: No 0.679 Undermining: No Wound Description Classification: Full Thickness With Exposed Suppor Wound Margin: Distinct, outline attached Exudate Amount: Medium Exudate Type: Serosanguineous Exudate Color: red, brown t Structures Foul Odor After Cleansing: No Slough/Fibrino Yes Wound Bed Granulation Amount: Large (67-100%) Exposed Structure Granulation Quality: Pink, Pale, Hyper-granulation Fascia Exposed: No Necrotic Amount: Small (1-33%) Fat Layer (Subcutaneous Tissue) Exposed: Yes Necrotic Quality: Adherent Slough Tendon Exposed: No Muscle Exposed: No Joint Exposed: No Bone Exposed: No [name], [name] ([id number]) [id number] Page 6 of 6 Periwound Skin Texture Texture Color No Abnormalities Noted: No No Abnormalities Noted: No Callus: No Atrophie Blanche: No Crepitus: No Cyanosis: No Excoriation: No Ecchymosis: No Induration: No Erythema: No Rash: No Hemosiderin Staining: No Scarring: No Mottled: No Pallor: No Moisture Rubor: No No Abnormalities Noted: No Dry / Scaly: No Temperature / Pain Maceration: No Temperature: No  Abnormality Treatment Notes Wound #5 (Lower Leg) Wound Laterality: Left, Anterior, Distal Cleanser Wound Cleanser Discharge Instruction: Cleanse the wound with wound cleanser prior to applying a clean dressing using gauze sponges, not tissue or cotton balls. Peri-Wound Care Topical Primary Dressing Puraply #14 Discharge Instruction: applied by Zafar Debrosse. Adaptic and steri-strips Discharge Instruction: Used to secure Puraply AM. Leave in place. Do not remove. Secondary Dressing Zetuvit Plus Silicone Border Dressing 5x5 (in/in) Discharge Instruction: Apply silicone border over primary dressing as directed. Secured With Compression Wrap Compression Stockings Facilities manager) Signed: 11/21/2022 5:08:59 PM By: Shawn Stall RN, BSN Entered By: Shawn Stall on 11/17/2022 11:26:30 -------------------------------------------------------------------------------- Vitals Details Patient Name: Date of Service: [name], DO [name] 11/17/2022 11:00 A M Medical Record Number: [id number] Patient Account Number: [id number] Date of Birth/Sex: Treating RN: 1945/08/08 (77 y.o. F) Primary Care Robbi Scurlock: Burton Apley Other Clinician: Referring Madisin Hasan: Treating Neilson Oehlert/Extender: Cherre Blanc in Treatment: 23 Vital Signs Time Taken: 11:24 Temperature (F): 97.7 Height (in): 61 Pulse (bpm): 86 Weight (lbs): 186 Respiratory Rate (breaths/min): 20 Body Mass Index (BMI): 35.1 Blood Pressure (mmHg): 153/73 Reference Range: 80 - 120 mg / dl Electronic Signature(s) Signed: 11/17/2022 3:54:44 PM By: Thayer [name] Entered By: Thayer [name] on 11/17/2022 11:24:22

## 2022-11-23 ENCOUNTER — Other Ambulatory Visit: Payer: Self-pay | Admitting: Internal Medicine

## 2022-11-23 ENCOUNTER — Ambulatory Visit (HOSPITAL_COMMUNITY)
Admission: RE | Admit: 2022-11-23 | Discharge: 2022-11-23 | Disposition: A | Discharge status: Home / Self Care | Payer: Medicare Other | Source: Ambulatory Visit | Attending: Internal Medicine | Admitting: Internal Medicine

## 2022-11-23 DIAGNOSIS — I3139 Other pericardial effusion (noninflammatory): Secondary | ICD-10-CM | POA: Insufficient documentation

## 2022-11-23 DIAGNOSIS — I351 Nonrheumatic aortic (valve) insufficiency: Secondary | ICD-10-CM | POA: Insufficient documentation

## 2022-11-23 DIAGNOSIS — I110 Hypertensive heart disease with heart failure: Secondary | ICD-10-CM | POA: Insufficient documentation

## 2022-11-23 DIAGNOSIS — I5022 Chronic systolic (congestive) heart failure: Secondary | ICD-10-CM | POA: Diagnosis present

## 2022-11-23 LAB — ECHOCARDIOGRAM COMPLETE
AR max vel: 2.09 cm2
AV Area VTI: 1.99 cm2
AV Area mean vel: 1.83 cm2
AV Mean grad: 5 mmHg
AV Peak grad: 8.5 mmHg
Ao pk vel: 1.46 m/s
Area-P 1/2: 6.32 cm2
Calc EF: 63.1 %
MV VTI: 1.82 cm2
S' Lateral: 3.7 cm
Single Plane A2C EF: 56.2 %
Single Plane A4C EF: 70.7 %

## 2022-11-24 ENCOUNTER — Encounter (HOSPITAL_BASED_OUTPATIENT_CLINIC_OR_DEPARTMENT_OTHER): Payer: Medicare Other | Admitting: Internal Medicine

## 2022-11-24 DIAGNOSIS — I87312 Chronic venous hypertension (idiopathic) with ulcer of left lower extremity: Secondary | ICD-10-CM

## 2022-11-24 DIAGNOSIS — L97828 Non-pressure chronic ulcer of other part of left lower leg with other specified severity: Secondary | ICD-10-CM

## 2022-11-25 NOTE — Progress Notes (Signed)
GRISSEL, PARSHALL ([id number]) [id number] Page 1 of 11 Visit Report for 11/24/2022 Chief Complaint Document Details Patient Name: Date of Service: [name], [name]. 11/24/2022 10:30 A M Medical Record Number: [id number] Patient Account Number: [id number] Date of Birth/Sex: Treating RN: Feb 14, 1946 (77 y.o. F) Primary Care Provider: Burton Apley Other Clinician: Referring Provider: Treating Provider/Extender: Cherre Blanc in Treatment: 24 Information Obtained from: Patient Chief Complaint 06/03/2022; Left lower extremity wounds Electronic Signature([name]) Signed: 11/24/2022 12:25:07 PM By: Geralyn Corwin Hailey Entered By: Geralyn Corwin on 11/24/2022 11:26:35 -------------------------------------------------------------------------------- Cellular or Tissue Based Product Details Patient Name: Date of Service: [name], [name]. 11/24/2022 10:30 A M Medical Record Number: [id number] Patient Account Number: [id number] Date of Birth/Sex: Treating RN: 1945/11/15 (77 y.o. [name], [name] Primary Care Provider: Burton Apley Other Clinician: Referring Provider: Treating Provider/Extender: Cherre Blanc in Treatment: 24 Cellular or Tissue Based Product Type Wound #5 Left,Distal,Anterior Lower Leg Applied to: Performed By: Physician Geralyn Corwin, Hailey Cellular or Tissue Based Product Type: Puraply AM Level of Consciousness (Pre-procedure): Awake and Alert Pre-procedure Verification/Time Out Yes - 11:16 Taken: Location: trunk / arms / legs Wound Size (sq cm): 3.5 Product Size (sq cm): 8 Waste Size (sq cm): 0 Amount of Product Applied (sq cm): 8 Instrument Used: Forceps, Scissors Lot #: [id number] Order #: [id number] Expiration Date: 12/04/2024 Fenestrated: No Reconstituted: Yes Solution Type: normal saline Solution Amount: 3mL Lot #: [id number] Solution Expiration Date: 12/15/2024 Secured: Yes Secured With:  Steri-Strips, adaptic Dressing Applied: No Procedural Pain: 0 Post Procedural Pain: 0 Response to Treatment: Procedure was tolerated well Level of Consciousness (Post- Awake and Alert procedure): Post Procedure Diagnosis Same as Pre-procedure Electronic Signature([name]) Signed: 11/24/2022 12:25:07 PM By: Geralyn Corwin Hailey Signed: 11/24/2022 5:46:17 PM By: Shawn Stall RN, BSN Entered By: Shawn Stall on 11/24/2022 11:17:23 [name] ([id number]) [id number] Page 2 of 11 -------------------------------------------------------------------------------- Debridement Details Patient Name: Date of Service: [name], [name]. 11/24/2022 10:30 A M Medical Record Number: [id number] Patient Account Number: [id number] Date of Birth/Sex: Treating RN: June 01, 1946 (77 y.o. [name], [name] Primary Care Provider: Burton Apley Other Clinician: Referring Provider: Treating Provider/Extender: Cherre Blanc in Treatment: 24 Debridement Performed for Assessment: Wound #5 Left,Distal,Anterior Lower Leg Performed By: Physician Geralyn Corwin, Hailey Debridement Type: Debridement Level of Consciousness (Pre-procedure): Awake and Alert Pre-procedure Verification/Time Out Yes - 11:08 Taken: Start Time: 11:09 Pain Control: Lidocaine 4% T opical Solution Percent of Wound Bed Debrided: 100% T Area Debrided (cm): otal 2.75 Tissue and other material debrided: Viable, Non-Viable, Slough, Subcutaneous, Skin: Dermis , Skin: Epidermis, Biofilm, Slough Level: Skin/Subcutaneous Tissue Debridement Description: Excisional Instrument: Curette Bleeding: Minimum Hemostasis Achieved: Pressure End Time: 11:15 Procedural Pain: 0 Post Procedural Pain: 0 Response to Treatment: Procedure was tolerated well Level of Consciousness (Post- Awake and Alert procedure): Post Debridement Measurements of Total Wound Length: (cm) 3.5 Width: (cm) 1 Depth: (cm)  0.2 Volume: (cm) 0.55 Character of Wound/Ulcer Post Debridement: Improved Post Procedure Diagnosis Same as Pre-procedure Electronic Signature([name]) Signed: 11/24/2022 12:25:07 PM By: Geralyn Corwin Hailey Signed: 11/24/2022 5:46:17 PM By: Shawn Stall RN, BSN Entered By: Shawn Stall on 11/24/2022 11:15:54 -------------------------------------------------------------------------------- HPI Details Patient Name: Date of Service: [name], [name]. 11/24/2022 10:30 A M Medical Record Number: [id number] Patient Account Number: [id number] Date of Birth/Sex: Treating RN: [birth year] (77 y.o. F) Primary Care Provider: Burton Apley Other Clinician: Referring Provider: Treating Provider/Extender:  Darral Dash Weeks in Treatment: 24 History of Present Illness HPI Description: Patient presents today for initial evaluation and our clinic as a referral from the [facility] health system Department of orthopedics for evaluation and treatment of the wound to his his of the left foot at the base of the great toe. Currently the good news is the patient really does not have any significant pain which is excellent. She has been seen by Dr. Luciana Axe at [facility]: infectious disease clinic that is the regional Center for infectious disease. Subsequently the patient was cultured and positive for methicillin sensitive staph aureus. She has been on antibiotics including Cipro prior to surgery as will several days following surgery. She then had clindamycin which was changed to doxycycline. She took this for 10 days. Subsequently the pain had improved and there was no longer any possible draining from the wound according to the notes. The patient sedentary rate as well as C-reactive protein have returned to normal ranges. Currently per Dr. Ephriam Knuckles assessment there was one dehiscence with no sign of osteomyelitis. He therefore discontinue the antibiotics with the completion of the last three days Of  doxycycline. Other than that he just set her for a follow-up as needed. The patient does not have diabetes and is not a current smoker. At this time her treatment that was recommended by the surgeon was applying a Hydrocolloid dressing. Based on what I'm seeing at this point the patient actually has some Slough noted over the surface of the wound there really does not appear to be evidence of infection though I Hailey think she does require some sharp debridement to clear away the slough and help with appropriate wound healing. She does have areas of granulation buds noted. She may be a candidate for a skin substitute as well. We will see how things Hailey over the next period of time until follow-up. No fevers, chills, nausea, or vomiting noted at [place] ([id number]) [phone].pdf Page 3 of 11 this time. 06/13/18 on evaluation today patient actually appears to be doing better in regard to her toe ulcer. She'[name] been using the Prisma on this region and that seems to have done extremely well for her. Fortunately there does not appear to be evidence of infection at this time which is good news. She did see her surgeon they were extremely pleased with the overall appearance of her wound. 06/27/18 on evaluation today patient actually appears to be doing very well in regard to her foot ulcer. She has been tolerating the dressing changes without complication which is great news. She did see her podiatrist they felt like everything is looking very nice as well and will please. 07/05/2018 surgical wound on the left foot. She appears to be doing well. Still surface debrided to remove however in general post debridement the wound bed looks quite healthy it is come down significantly in terms of dimensions using silver collagen. The patient describes pain when her foot is elevated either in bed at night [sometimes keeps her awake] or when is propped up on a foot rest. She basically takes  analgesics for this. She does not really describe claudication with activity however her activity is very limited by interstitial lung disease. Her ABIs initially in this clinic were noncompressible. She is not a diabetic 07/20/2018; surgical wound on the left foot. Wound actually looks quite a bit better than I remember seeing this. She is still describing pain with her leg elevated at night that she does  not get when the wound is supine. She does not really describe claudication but she is very limited by her pulmonary status. We have been using silver collagen. 1/17; the patient has been followed up by orthopedics and discharge. Her arterial studies were actually quite good and should not be contributing to any pain. Slight reduction in ABIs but otherwise normal. Her wound is closed. She saw Dr. Luciana Axe of infectious disease surrounding the surgery. By review of our notes she was not felt to have osteomyelitis. 06/03/2022 [name] is a 77 year old female with a past medical history of interstitial lung disease on chronic oxygen via nasal cannula, rheumatoid arthritis, chronic diastolic heart failure and OSA that presents to the clinic for a 1 to 70-month history of nonhealing ulcer to the left lower extremity. On 04/20/2022 she was admitted to the hospital for left lower extremity cellulitis. She required 8 days of IV vancomycin and Unasyn followed by a 14-day course of Augmentin and doxycycline. She has been using Silvadene cream to the area. She does not use compression therapy but does own compression stockings. She currently denies signs of infection. 11/27; patient presents for follow-up she is using Medihoney and Hydrofera Blue to the wound beds. She has been using her Tubigrip. She has no issues or complaints today. 12/4; patient presents for follow-up. She continues to use Medihoney and Hydrofera Blue to the wound beds. Several attempts have been made to schedule ABIs with TBI'[name]. We  gave patient the number today to call to have these scheduled. She has been using Tubigrip. She has no issues or complaints today. 12/18; she continues to use Medihoney and Hydrofera Blue in general the surface of the wound is looking somewhat better this week and measurements are slightly smaller. She did have her arterial studies done these were noncompressible bilaterally however TBI on the right of 0.69 on the left 0.62. Waveforms on the right were triphasic on the left biphasic to triphasic. This does not suggest significant arterial disease to make healing wounds at this location difficult. 12/29; patient presents for follow-up. She has been using Medihoney and Hydrofera Blue to the wound bed. She has been using Tubigrip. 1/5; patient presents for follow-up. She is been using Medihoney and Hydrofera Blue to the wound bed along with Tubigrip. The Tubigrip was doubled at last clinic visit. There is been improvement in wound healing. 1/12; patient presents for follow-up. She has been using Medihoney and Hydrofera Blue to the wound bed along with Tubigrip. There continues to be improvement in wound healing. 1/19; patient presents for follow-up. She has been using Medihoney and Hydrofera Blue to the wound bed along with her juxta light compression daily. There is been improvement in wound measurements. Patient has no issues or complaints today. 1/25; patient presents for follow-up. She has been using blast X and collagen to the wound bed under juxta light compression daily. She has no issues or complaints today. There is been improvement in wound healing. 2/1; patient presents for follow-up. She has been using blast X and collagen to the wound bed under juxta light compression daily. She has no issues or complaints today. 2/9; patient presents for follow-up. She has been using Hydrofera Blue with blast X under compression wrap daily. She has been approved for a skin substitute and patient would like  to proceed with this. 2/15; patient presents for follow-up. PuraPly was placed in standard fashion to the large anterior leg wound. She has been using blast X and Hydrofera Blue  to the more proximal small wound. She has been using her juxta light compression daily. She has no issues or complaints today. 2/23; patient presents for follow-up. PuraPly was placed in standard fashion to the large anterior leg wound. The more proximal small wound is almost healed and they have been using blast X and Hydrofera Blue. She has been using her juxta light compression daily to the left lower extremity. There is been improvement in wound healing. 2/29; patient presents for follow-up. PuraPly was placed in standard fashion to the large anterior leg wound. The more proximal wound has healed. She is been using her juxta light compression daily. She has no issues or complaints today. 3/8; patient presents for follow-up. PuraPly has been used to the wound bed weekly patient has done well with this. She has been using her juxta light compression daily. She has no issues or complaints today. 3/15; patient presents for follow-up. We have been using PuraPly to the wound bed weekly. She has been using her juxta light compression daily. She has been doing well with this treatment. Her wound has improved. She has no issues or complaints today. 3/22; patient presents for follow-up. We have been using PuraPly to the wound bed weekly. She has been using her juxta lite compression. Overall more granulation tissue present today. Patient has no issues or complaints today. 3/29; patient presents for follow-up. We have been using PuraPly to the wound bed weekly. She has been using her juxta lite compression daily. The wound is smaller. 10/20/2022: The wound continues to contract. There is some slough accumulation in the deepest portion of the wound. No concern for infection. 4/12; patient presents for follow-up. We have been placing  PuraPly to the wound bed weekly. There has been improvement in wound healing. 4/18; patient presents for follow-up. We have been using PuraPly to the wound bed. There continues to be improvement in wound healing. She has no issues or complaints. 4/25; Patient presents for follow-up. We have been using PuraPly to the wound bed. The wound is smaller. 5/2; patient presents for follow-up. We have been using PuraPly. The wound is smaller. 5/9; patient presents for follow-up. We have been using PuraPly. Again wound is smaller. [name], [name] ([id number]) [id number] Page 4 of 11 Electronic Signature([name]) Signed: 11/24/2022 12:25:07 PM By: Geralyn Corwin Hailey Entered By: Geralyn Corwin on 11/24/2022 11:27:13 -------------------------------------------------------------------------------- Physical Exam Details Patient Name: Date of Service: [name], [name]. 11/24/2022 10:30 A M Medical Record Number: [id number] Patient Account Number: [id number] Date of Birth/Sex: Treating RN: Jan 26, 1946 (77 y.o. F) Primary Care Provider: Burton Apley Other Clinician: Referring Provider: Treating Provider/Extender: Cherre Blanc in Treatment: 24 Constitutional respirations regular, non-labored and within target range for patient.. Cardiovascular 2+ dorsalis pedis/posterior tibialis pulses. Psychiatric pleasant and cooperative. Notes T the right lower extremity there is an open wound with granulation tissue and nonviable tissue. Epithelization occurring to the edges circumferentially. Good o edema control. No signs of infection. Electronic Signature([name]) Signed: 11/24/2022 12:25:07 PM By: Geralyn Corwin Hailey Entered By: Geralyn Corwin on 11/24/2022 11:27:46 -------------------------------------------------------------------------------- Physician Orders Details Patient Name: Date of Service: [name], [name]. 11/24/2022 10:30 A M Medical Record  Number: [id number] Patient Account Number: [id number] Date of Birth/Sex: Treating RN: 05-28-1946 (77 y.o. Arta Silence Primary Care Provider: Burton Apley Other Clinician: Referring Provider: Treating Provider/Extender: Cherre Blanc in Treatment: 24 Verbal / Phone Orders: No Diagnosis Coding ICD-10 Coding Code Description [phone]  Non-pressure chronic ulcer of other part of left lower leg with other specified severity I87.312 Chronic venous hypertension (idiopathic) with ulcer of left lower extremity J84.9 Interstitial pulmonary disease, unspecified M06.9 Rheumatoid arthritis, unspecified Follow-up Appointments ppointment in 1 week. - Dr Mikey Bussing - Rm #7 on 12/01/22 @ 1030 Return A ppointment in 2 weeks. - Dr. Mikey Bussing room 8 12/08/2022 1100 Return A Anesthetic (In clinic) Topical Lidocaine 5% applied to wound bed Cellular or Tissue Based Products Wound #5 Left,Distal,Anterior Lower Leg Cellular or Tissue Based Product Type: - 08/18/2022 insurance ran organogenesis- 100% covered. 08/26/2022 #1 Puraply AM applied. 09/01/2022 #2 Puraply AM applied. 09/09/2022 #3 Puraply AM applied to both wounds. 09/15/2022 #4 Puraply AM applied to wound. 09/23/2022 #5 Puraply AM applied to wound. 09/30/22 # 6 PUraply AM applied to wound. 10/07/22 #7 Puraply AM applied to wound. DORRI, BERGARA ([id number]) [id number] Page 5 of 11 10/14/2022 #8 Puraply AM applied to wound. 10/20/22 #9 Puraply AM applied to wound 10/28/2022 #11 Puraply AM applied to wound. 11/03/2022 #12 Puraply AM applied to wound. 11/10/2022 #13 Puraply AM applied to wound. 11/17/2022 #14 Puraply AM applied to wound bed. 11/24/2022 #15 puraply AM applied to wound bed. Cellular or Tissue Based Product applied to wound bed, secured with steri-strips, cover with Adaptic or Mepitel. (Hailey NOT REMOVE). Bathing/ Shower/ Hygiene May shower with protection but Hailey not get wound dressing([name]) wet. Protect  dressing([name]) with water repellant cover (for example, large plastic bag) or a cast cover and may then take shower. Edema Control - Lymphedema / SCD / Other Elevate legs to the level of the heart or above for 30 minutes daily and/or when sitting for 3-4 times a day throughout the day. Avoid standing for long periods of time. Exercise regularly Compression stocking or Garment 20-30 mm/Hg pressure to: - start wearing the juxtalite HD apply in the morning and remove at night left leg. Non Wound Condition Protect area with: - buttock area with AandD ointment daily Wound Treatment Wound #5 - Lower Leg Wound Laterality: Left, Anterior, Distal Cleanser: Wound Cleanser (Generic) 1 x Per Week/30 Days Discharge Instructions: Cleanse the wound with wound cleanser prior to applying a clean dressing using gauze sponges, not tissue or cotton balls. Prim Dressing: Puraply #15 1 x Per Week/30 Days ary Discharge Instructions: applied by provider. Prim Dressing: Adaptic and steri-strips 1 x Per Week/30 Days ary Discharge Instructions: Used to secure Puraply AM. Leave in place. Hailey not remove. Secondary Dressing: Zetuvit Plus Silicone Border Dressing 5x5 (in/in) 1 x Per Week/30 Days Discharge Instructions: Apply silicone border over primary dressing as directed. Electronic Signature([name]) Signed: 11/24/2022 12:25:07 PM By: Geralyn Corwin Hailey Entered By: Geralyn Corwin on 11/24/2022 11:27:55 -------------------------------------------------------------------------------- Problem List Details Patient Name: Date of Service: [name], [name]. 11/24/2022 10:30 A M Medical Record Number: [id number] Patient Account Number: [id number] Date of Birth/Sex: Treating RN: [birth year] (77 y.o. [name], [name] Primary Care Provider: Burton Apley Other Clinician: Referring Provider: Treating Provider/Extender: Cherre Blanc in Treatment: 24 Active Problems ICD-10 Encounter Code Description  Active Date MDM Diagnosis L97.828 Non-pressure chronic ulcer of other part of left lower leg with other specified 06/03/2022 No Yes severity I87.312 Chronic venous hypertension (idiopathic) with ulcer of left lower extremity 06/03/2022 No Yes J84.9 Interstitial pulmonary disease, unspecified 06/03/2022 No Yes M06.9 Rheumatoid arthritis, unspecified 06/03/2022 No Yes AHLAYA, STUKEY ([id number]) [phone].pdf Page 6 of 11 Inactive Problems Resolved Problems Electronic Signature([name]) Signed: 11/24/2022 12:25:07 PM By: Geralyn Corwin  Hailey Entered By: Geralyn Corwin on 11/24/2022 11:26:22 -------------------------------------------------------------------------------- Progress Note Details Patient Name: Date of Service: [name], [name]. 11/24/2022 10:30 A M Medical Record Number: [id number] Patient Account Number: [id number] Date of Birth/Sex: Treating RN: July 09, 1946 (77 y.o. F) Primary Care Provider: Burton Apley Other Clinician: Referring Provider: Treating Provider/Extender: Cherre Blanc in Treatment: 24 Subjective Chief Complaint Information obtained from Patient 06/03/2022; Left lower extremity wounds History of Present Illness (HPI) Patient presents today for initial evaluation and our clinic as a referral from the [facility] health system Department of orthopedics for evaluation and treatment of the wound to his his of the left foot at the base of the great toe. Currently the good news is the patient really does not have any significant pain which is excellent. She has been seen by Dr. Luciana Axe at [facility]: infectious disease clinic that is the regional Center for infectious disease. Subsequently the patient was cultured and positive for methicillin sensitive staph aureus. She has been on antibiotics including Cipro prior to surgery as will several days following surgery. She then had clindamycin which was changed to doxycycline.  She took this for 10 days. Subsequently the pain had improved and there was no longer any possible draining from the wound according to the notes. The patient sedentary rate as well as C-reactive protein have returned to normal ranges. Currently per Dr. Ephriam Knuckles assessment there was one dehiscence with no sign of osteomyelitis. He therefore discontinue the antibiotics with the completion of the last three days Of doxycycline. Other than that he just set her for a follow-up as needed. The patient does not have diabetes and is not a current smoker. At this time her treatment that was recommended by the surgeon was applying a Hydrocolloid dressing. Based on what I'm seeing at this point the patient actually has some Slough noted over the surface of the wound there really does not appear to be evidence of infection though I Hailey think she does require some sharp debridement to clear away the slough and help with appropriate wound healing. She does have areas of granulation buds noted. She may be a candidate for a skin substitute as well. We will see how things Hailey over the next period of time until follow-up. No fevers, chills, nausea, or vomiting noted at this time. 06/13/18 on evaluation today patient actually appears to be doing better in regard to her toe ulcer. She'[name] been using the Prisma on this region and that seems to have done extremely well for her. Fortunately there does not appear to be evidence of infection at this time which is good news. She did see her surgeon they were extremely pleased with the overall appearance of her wound. 06/27/18 on evaluation today patient actually appears to be doing very well in regard to her foot ulcer. She has been tolerating the dressing changes without complication which is great news. She did see her podiatrist they felt like everything is looking very nice as well and will please. 07/05/2018 surgical wound on the left foot. She appears to be doing well. Still  surface debrided to remove however in general post debridement the wound bed looks quite healthy it is come down significantly in terms of dimensions using silver collagen. The patient describes pain when her foot is elevated either in bed at night [sometimes keeps her awake] or when is propped up on a foot rest. She basically takes analgesics for this. She does not really describe claudication  with activity however her activity is very limited by interstitial lung disease. Her ABIs initially in this clinic were noncompressible. She is not a diabetic 07/20/2018; surgical wound on the left foot. Wound actually looks quite a bit better than I remember seeing this. She is still describing pain with her leg elevated at night that she does not get when the wound is supine. She does not really describe claudication but she is very limited by her pulmonary status. We have been using silver collagen. 1/17; the patient has been followed up by orthopedics and discharge. Her arterial studies were actually quite good and should not be contributing to any pain. Slight reduction in ABIs but otherwise normal. Her wound is closed. She saw Dr. Luciana Axe of infectious disease surrounding the surgery. By review of our notes she was not felt to have osteomyelitis. 06/03/2022 [name] is a 77 year old female with a past medical history of interstitial lung disease on chronic oxygen via nasal cannula, rheumatoid arthritis, chronic diastolic heart failure and OSA that presents to the clinic for a 1 to 35-month history of nonhealing ulcer to the left lower extremity. On 04/20/2022 she was admitted to the hospital for left lower extremity cellulitis. She required 8 days of IV vancomycin and Unasyn followed by a 14-day course of Augmentin and doxycycline. She has been using Silvadene cream to the area. She does not use compression therapy but does own compression stockings. She currently denies signs of infection. 11/27;  patient presents for follow-up she is using Medihoney and Hydrofera Blue to the wound beds. She has been using her Tubigrip. She has no issues or complaints today. 12/4; patient presents for follow-up. She continues to use Medihoney and Hydrofera Blue to the wound beds. Several attempts have been made to schedule ABIs with TBI'[name]. We gave patient the number today to call to have these scheduled. She has been using Tubigrip. She has no issues or complaints today. 12/18; she continues to use Medihoney and Hydrofera Blue in general the surface of the wound is looking somewhat better this week and measurements are slightly smaller. She did have her arterial studies done these were noncompressible bilaterally however TBI on the right of 0.69 on the left 0.62. Waveforms on the right were triphasic on the left biphasic to triphasic. This does not suggest significant arterial disease to make healing wounds at this location difficult. 12/29; patient presents for follow-up. She has been using Medihoney and Hydrofera Blue to the wound bed. She has been using Tubigrip. 1/5; patient presents for follow-up. She is been using Medihoney and Hydrofera Blue to the wound bed along with Tubigrip. The Tubigrip was doubled at last clinic visit. There is been improvement in wound healing. 1/12; patient presents for follow-up. She has been using Medihoney and Hydrofera Blue to the wound bed along with Tubigrip. There continues to be improvement in wound healing. [name], [name] ([id number]) [id number] Page 7 of 11 1/19; patient presents for follow-up. She has been using Medihoney and Hydrofera Blue to the wound bed along with her juxta light compression daily. There is been improvement in wound measurements. Patient has no issues or complaints today. 1/25; patient presents for follow-up. She has been using blast X and collagen to the wound bed under juxta light compression daily. She has no issues  or complaints today. There is been improvement in wound healing. 2/1; patient presents for follow-up. She has been using blast X and collagen to the wound bed under juxta light compression daily.  She has no issues or complaints today. 2/9; patient presents for follow-up. She has been using Hydrofera Blue with blast X under compression wrap daily. She has been approved for a skin substitute and patient would like to proceed with this. 2/15; patient presents for follow-up. PuraPly was placed in standard fashion to the large anterior leg wound. She has been using blast X and Hydrofera Blue to the more proximal small wound. She has been using her juxta light compression daily. She has no issues or complaints today. 2/23; patient presents for follow-up. PuraPly was placed in standard fashion to the large anterior leg wound. The more proximal small wound is almost healed and they have been using blast X and Hydrofera Blue. She has been using her juxta light compression daily to the left lower extremity. There is been improvement in wound healing. 2/29; patient presents for follow-up. PuraPly was placed in standard fashion to the large anterior leg wound. The more proximal wound has healed. She is been using her juxta light compression daily. She has no issues or complaints today. 3/8; patient presents for follow-up. PuraPly has been used to the wound bed weekly patient has done well with this. She has been using her juxta light compression daily. She has no issues or complaints today. 3/15; patient presents for follow-up. We have been using PuraPly to the wound bed weekly. She has been using her juxta light compression daily. She has been doing well with this treatment. Her wound has improved. She has no issues or complaints today. 3/22; patient presents for follow-up. We have been using PuraPly to the wound bed weekly. She has been using her juxta lite compression. Overall more granulation tissue  present today. Patient has no issues or complaints today. 3/29; patient presents for follow-up. We have been using PuraPly to the wound bed weekly. She has been using her juxta lite compression daily. The wound is smaller. 10/20/2022: The wound continues to contract. There is some slough accumulation in the deepest portion of the wound. No concern for infection. 4/12; patient presents for follow-up. We have been placing PuraPly to the wound bed weekly. There has been improvement in wound healing. 4/18; patient presents for follow-up. We have been using PuraPly to the wound bed. There continues to be improvement in wound healing. She has no issues or complaints. 4/25; Patient presents for follow-up. We have been using PuraPly to the wound bed. The wound is smaller. 5/2; patient presents for follow-up. We have been using PuraPly. The wound is smaller. 5/9; patient presents for follow-up. We have been using PuraPly. Again wound is smaller. Patient History Information obtained from Patient. Family History Cancer - Maternal Grandparents, Diabetes - Siblings, Heart Disease - Maternal Grandparents,Paternal Grandparents,Mother,Father,Siblings, Hypertension - Mother,Father,Siblings, Stroke - Paternal Grandparents, No family history of Hereditary Spherocytosis, Kidney Disease, Lung Disease, Seizures, Thyroid Problems, Tuberculosis. Social History Never smoker, Marital Status - Married, Alcohol Use - Moderate, Drug Use - No History, Caffeine Use - Moderate. Medical History Eyes Denies history of Cataracts, Optic Neuritis Ear/Nose/Mouth/Throat Denies history of Chronic sinus problems/congestion Hematologic/Lymphatic Denies history of Anemia, Hemophilia, Human Immunodeficiency Virus, Lymphedema, Sickle Cell Disease Respiratory Patient has history of Asthma, Sleep Apnea Denies history of Aspiration, Chronic Obstructive Pulmonary Disease (COPD), Pneumothorax, Tuberculosis Cardiovascular Patient has  history of Hypertension Denies history of Angina, Arrhythmia, Congestive Heart Failure, Coronary Artery Disease, Deep Vein Thrombosis, Hypotension, Myocardial Infarction, Peripheral Arterial Disease, Peripheral Venous Disease, Phlebitis, Vasculitis Gastrointestinal Patient has history of Colitis Denies history of Cirrhosis ,  Crohnoos, Hepatitis A, Hepatitis B, Hepatitis C Endocrine Denies history of Type I Diabetes, Type II Diabetes Immunological Denies history of Lupus Erythematosus, Raynaudoos, Scleroderma Integumentary (Skin) Denies history of History of Burn Musculoskeletal Patient has history of Rheumatoid Arthritis, Osteoarthritis Denies history of Gout, Osteomyelitis Neurologic Denies history of Dementia, Neuropathy, Quadriplegia, Paraplegia, Seizure Disorder Oncologic Denies history of Received Chemotherapy, Received Radiation Psychiatric Denies history of Wyn Quaker ITZAYANNA, BENGTSON ([id number]) [id number] Page 8 of 11 Hospitalization/Surgery History - surgery toe. - right hip replacement 12/2018. - heart cath 12/17/2021. Medical A Surgical History Notes nd Respiratory 4L [place] 02 pulmonary fibrosis Objective Constitutional respirations regular, non-labored and within target range for patient.. Vitals Time Taken: 10:52 AM, Height: 61 in, Weight: 186 lbs, BMI: 35.1, Temperature: 97.6 F, Pulse: 78 bpm, Respiratory Rate: 20 breaths/min, Blood Pressure: 141/69 mmHg. Cardiovascular 2+ dorsalis pedis/posterior tibialis pulses. Psychiatric pleasant and cooperative. General Notes: T the right lower extremity there is an open wound with granulation tissue and nonviable tissue. Epithelization occurring to the edges o circumferentially. Good edema control. No signs of infection. Integumentary (Hair, Skin) Wound #5 status is Open. Original cause of wound was Gradually Appeared. The date acquired was: 04/19/2022. The wound has  been in treatment 24 weeks. The wound is located on the [facility] Lower Leg. The wound measures 3.5cm length x 1cm width x 0.2cm depth; 2.749cm^2 area and 0.55cm^3 volume. There is Fat Layer (Subcutaneous Tissue) exposed. There is no tunneling or undermining noted. There is a medium amount of serosanguineous drainage noted. The wound margin is distinct with the outline attached to the wound base. There is large (67-100%) pink, pale, hyper - granulation within the wound bed. There is a small (1-33%) amount of necrotic tissue within the wound bed including Adherent Slough. The periwound skin appearance did not exhibit: Callus, Crepitus, Excoriation, Induration, Rash, Scarring, Dry/Scaly, Maceration, Atrophie Blanche, Cyanosis, Ecchymosis, Hemosiderin Staining, Mottled, Pallor, Rubor, Erythema. Periwound temperature was noted as No Abnormality. Assessment Active Problems ICD-10 Non-pressure chronic ulcer of other part of left lower leg with other specified severity Chronic venous hypertension (idiopathic) with ulcer of left lower extremity Interstitial pulmonary disease, unspecified Rheumatoid arthritis, unspecified Patient'[name] wound has shown improvement in size in appearance since last clinic visit. I debrided nonviable tissue. PuraPly was placed in standard fashion. Follow-up in 1 week. Continue compression Velcro wrap daily. Procedures Wound #5 Pre-procedure diagnosis of Wound #5 is a Cellulitis located on the Left,Distal,Anterior Lower Leg . There was a Excisional Skin/Subcutaneous Tissue Debridement with a total area of 2.75 sq cm performed by Geralyn Corwin, Hailey. With the following instrument([name]): Curette to remove Viable and Non-Viable tissue/material. Material removed includes Subcutaneous Tissue, Slough, Skin: Dermis, Skin: Epidermis, and Biofilm after achieving pain control using Lidocaine 4% T opical Solution. A time out was conducted at 11:08, prior to the start of the  procedure. A Minimum amount of bleeding was controlled with Pressure. The procedure was tolerated well with a pain level of 0 throughout and a pain level of 0 following the procedure. Post Debridement Measurements: 3.5cm length x 1cm width x 0.2cm depth; 0.55cm^3 volume. Character of Wound/Ulcer Post Debridement is improved. Post procedure Diagnosis Wound #5: Same as Pre-Procedure Pre-procedure diagnosis of Wound #5 is a Cellulitis located on the Left,Distal,Anterior Lower Leg. A skin graft procedure using a bioengineered skin substitute/cellular or tissue based product was performed by Geralyn Corwin, Hailey with the following instrument([name]): Forceps and Scissors. Puraply AM was applied and secured with Steri-Strips and adaptic. 8 sq  cm of product was utilized and 0 sq cm was wasted. Post Application, no dressing was applied. A Time Out was conducted at 11:16, prior to the start of the procedure. The procedure was tolerated well with a pain level of 0 throughout and a pain level of 0 following the procedure. Post procedure Diagnosis Wound #5: Same as Pre-Procedure . [name], [name] ([id number]) [id number] Page 9 of 11 Plan Follow-up Appointments: Return Appointment in 1 week. - Dr Mikey Bussing - Rm #7 on 12/01/22 @ 1030 Return Appointment in 2 weeks. - Dr. Mikey Bussing room 8 12/08/2022 1100 Anesthetic: (In clinic) Topical Lidocaine 5% applied to wound bed Cellular or Tissue Based Products: Wound #5 Left,Distal,Anterior Lower Leg: Cellular or Tissue Based Product Type: - 08/18/2022 insurance ran organogenesis- 100% covered. 08/26/2022 #1 Puraply AM applied. 09/01/2022 #2 Puraply AM applied. 09/09/2022 #3 Puraply AM applied to both wounds. 09/15/2022 #4 Puraply AM applied to wound. 09/23/2022 #5 Puraply AM applied to wound. 09/30/22 # 6 PUraply AM applied to wound. 10/07/22 #7 Puraply AM applied to wound. 10/14/2022 #8 Puraply AM applied to wound. 10/20/22 #9 Puraply AM applied to  wound 10/28/2022 #11 Puraply AM applied to wound. 11/03/2022 #12 Puraply AM applied to wound. 11/10/2022 #13 Puraply AM applied to wound. 11/17/2022 #14 Puraply AM applied to wound bed. 11/24/2022 #15 puraply AM applied to wound bed. Cellular or Tissue Based Product applied to wound bed, secured with steri-strips, cover with Adaptic or Mepitel. (Hailey NOT REMOVE). Bathing/ Shower/ Hygiene: May shower with protection but Hailey not get wound dressing([name]) wet. Protect dressing([name]) with water repellant cover (for example, large plastic bag) or a cast cover and may then take shower. Edema Control - Lymphedema / SCD / Other: Elevate legs to the level of the heart or above for 30 minutes daily and/or when sitting for 3-4 times a day throughout the day. Avoid standing for long periods of time. Exercise regularly Compression stocking or Garment 20-30 mm/Hg pressure to: - start wearing the juxtalite HD apply in the morning and remove at night left leg. Non Wound Condition: Protect area with: - buttock area with AandD ointment daily WOUND #5: - Lower Leg Wound Laterality: Left, Anterior, Distal Cleanser: Wound Cleanser (Generic) 1 x Per Week/30 Days Discharge Instructions: Cleanse the wound with wound cleanser prior to applying a clean dressing using gauze sponges, not tissue or cotton balls. Prim Dressing: Puraply #15 1 x Per Week/30 Days ary Discharge Instructions: applied by provider. Prim Dressing: Adaptic and steri-strips 1 x Per Week/30 Days ary Discharge Instructions: Used to secure Puraply AM. Leave in place. Hailey not remove. Secondary Dressing: Zetuvit Plus Silicone Border Dressing 5x5 (in/in) 1 x Per Week/30 Days Discharge Instructions: Apply silicone border over primary dressing as directed. 1. In office sharp debridement 2. PuraPly placed in standard fashion 3. Compression Velcro wrap daily 4. Follow-up in 1 week Electronic Signature([name]) Signed: 11/24/2022 12:25:07 PM By: Geralyn Corwin Hailey Entered By:  Geralyn Corwin on 11/24/2022 11:28:35 -------------------------------------------------------------------------------- HxROS Details Patient Name: Date of Service: [name], [name]. 11/24/2022 10:30 A M Medical Record Number: [id number] Patient Account Number: [id number] Date of Birth/Sex: Treating RN: 08-24-1945 (77 y.o. F) Primary Care Provider: Burton Apley Other Clinician: Referring Provider: Treating Provider/Extender: Cherre Blanc in Treatment: 24 Information Obtained From Patient Eyes Medical History: Negative for: Cataracts; Optic Neuritis Ear/Nose/Mouth/Throat Medical History: Negative for: Chronic sinus problems/congestion Hematologic/Lymphatic Medical History: Negative for: Anemia; Hemophilia; Human Immunodeficiency Virus; Lymphedema; Sickle Cell Disease Respiratory [name], Hailey  [name] ([id number]) [id number] Page 10 of 11 Medical History: Positive for: Asthma; Sleep Apnea Negative for: Aspiration; Chronic Obstructive Pulmonary Disease (COPD); Pneumothorax; Tuberculosis Past Medical History Notes: 4L [place] 02 pulmonary fibrosis Cardiovascular Medical History: Positive for: Hypertension Negative for: Angina; Arrhythmia; Congestive Heart Failure; Coronary Artery Disease; Deep Vein Thrombosis; Hypotension; Myocardial Infarction; Peripheral Arterial Disease; Peripheral Venous Disease; Phlebitis; Vasculitis Gastrointestinal Medical History: Positive for: Colitis Negative for: Cirrhosis ; Crohns; Hepatitis A; Hepatitis B; Hepatitis C Endocrine Medical History: Negative for: Type I Diabetes; Type II Diabetes Immunological Medical History: Negative for: Lupus Erythematosus; Raynauds; Scleroderma Integumentary (Skin) Medical History: Negative for: History of Burn Musculoskeletal Medical History: Positive for: Rheumatoid Arthritis; Osteoarthritis Negative for: Gout; Osteomyelitis Neurologic Medical  History: Negative for: Dementia; Neuropathy; Quadriplegia; Paraplegia; Seizure Disorder Oncologic Medical History: Negative for: Received Chemotherapy; Received Radiation Psychiatric Medical History: Negative for: Anorexia/bulimia; Confinement Anxiety Immunizations Pneumococcal Vaccine: Received Pneumococcal Vaccination: Yes Received Pneumococcal Vaccination On or After 60th Birthday: Yes Implantable Devices No devices added Hospitalization / Surgery History Type of Hospitalization/Surgery surgery toe right hip replacement 12/2018 heart cath 12/17/2021 Family and Social History Cancer: Yes - Maternal Grandparents; Diabetes: Yes - Siblings; Heart Disease: Yes - Maternal Grandparents,Paternal Grandparents,Mother,Father,Siblings; Hereditary Spherocytosis: No; Hypertension: Yes - Mother,Father,Siblings; Kidney Disease: No; Lung Disease: No; Seizures: No; Stroke: Yes - Paternal Grandparents; Thyroid Problems: No; Tuberculosis: No; Never smoker; Marital Status - Married; Alcohol Use: Moderate; Drug Use: No History; Caffeine Use: Moderate; Financial Concerns: No; Food, Clothing or Shelter Needs: No; Support System Lacking: No; Transportation Concerns: No [name], [name] ([id number]) [id number] Page 11 of 11 Electronic Signature([name]) Signed: 11/24/2022 12:25:07 PM By: Geralyn Corwin Hailey Entered By: Geralyn Corwin on 11/24/2022 11:27:19 -------------------------------------------------------------------------------- SuperBill Details Patient Name: Date of Service: Hailey Wayne [name], [name]. 11/24/2022 Medical Record Number: [id number] Patient Account Number: [id number] Date of Birth/Sex: Treating RN: Oct 31, 1945 (76 y.o. [name], [name] Primary Care Provider: Burton Apley Other Clinician: Referring Provider: Treating Provider/Extender: Cherre Blanc in Treatment: 24 Diagnosis Coding ICD-10 Codes Code Description [phone] Non-pressure chronic  ulcer of other part of left lower leg with other specified severity I87.312 Chronic venous hypertension (idiopathic) with ulcer of left lower extremity J84.9 Interstitial pulmonary disease, unspecified M06.9 Rheumatoid arthritis, unspecified Facility Procedures : CPT4 Code: [id number] Description: Q4196 - PuraPly Product AM 2X4 (8 sq cm) Modifier: Quantity: 8 : CPT4 Code: [id number] Description: 15271 - SKIN SUB GRAFT TRNK/ARM/LEG ICD-10 Diagnosis Description L97.828 Non-pressure chronic ulcer of other part of left lower leg with other specified [name] I87.312 Chronic venous hypertension (idiopathic) with ulcer of left lower extremity Modifier: everity Quantity: 1 Physician Procedures : CPT4 Code Description Modifier [id number] - WC PHYS SKIN SUB GRAFT TRNK/ARM/LEG ICD-10 Diagnosis Description L97.828 Non-pressure chronic ulcer of other part of left lower leg with other specified severity I87.312 Chronic venous hypertension  (idiopathic) with ulcer of left lower extremity Quantity: 1 Electronic Signature([name]) Signed: 11/24/2022 12:25:07 PM By: Geralyn Corwin Hailey Entered By: Geralyn Corwin on 11/24/2022 11:28:46

## 2022-11-30 ENCOUNTER — Other Ambulatory Visit: Payer: Self-pay | Admitting: Internal Medicine

## 2022-12-01 ENCOUNTER — Encounter (HOSPITAL_BASED_OUTPATIENT_CLINIC_OR_DEPARTMENT_OTHER): Payer: Medicare Other | Admitting: Internal Medicine

## 2022-12-01 DIAGNOSIS — I87312 Chronic venous hypertension (idiopathic) with ulcer of left lower extremity: Secondary | ICD-10-CM

## 2022-12-01 DIAGNOSIS — L97828 Non-pressure chronic ulcer of other part of left lower leg with other specified severity: Secondary | ICD-10-CM | POA: Diagnosis not present

## 2022-12-06 ENCOUNTER — Ambulatory Visit: Payer: Medicare Other | Admitting: Internal Medicine

## 2022-12-08 ENCOUNTER — Encounter (HOSPITAL_BASED_OUTPATIENT_CLINIC_OR_DEPARTMENT_OTHER): Payer: Medicare Other | Admitting: Internal Medicine

## 2022-12-08 DIAGNOSIS — I87312 Chronic venous hypertension (idiopathic) with ulcer of left lower extremity: Secondary | ICD-10-CM | POA: Diagnosis not present

## 2022-12-08 DIAGNOSIS — L97828 Non-pressure chronic ulcer of other part of left lower leg with other specified severity: Secondary | ICD-10-CM

## 2022-12-14 ENCOUNTER — Ambulatory Visit: Payer: Medicare Other | Admitting: Internal Medicine

## 2022-12-15 ENCOUNTER — Other Ambulatory Visit: Payer: Self-pay | Admitting: Pharmacist

## 2022-12-15 ENCOUNTER — Encounter (HOSPITAL_BASED_OUTPATIENT_CLINIC_OR_DEPARTMENT_OTHER): Payer: Medicare Other | Admitting: Internal Medicine

## 2022-12-15 DIAGNOSIS — L97828 Non-pressure chronic ulcer of other part of left lower leg with other specified severity: Secondary | ICD-10-CM | POA: Diagnosis not present

## 2022-12-15 DIAGNOSIS — J849 Interstitial pulmonary disease, unspecified: Secondary | ICD-10-CM

## 2022-12-15 DIAGNOSIS — I87312 Chronic venous hypertension (idiopathic) with ulcer of left lower extremity: Secondary | ICD-10-CM

## 2022-12-15 DIAGNOSIS — I272 Pulmonary hypertension, unspecified: Secondary | ICD-10-CM

## 2022-12-15 MED ORDER — TYVASO DPI MAINTENANCE KIT 64 MCG IN POWD: 1.0000 | Freq: Four times a day (QID) | RESPIRATORY_TRACT | 5 refills | Status: DC

## 2022-12-16 ENCOUNTER — Encounter: Payer: Self-pay | Admitting: Internal Medicine

## 2022-12-16 ENCOUNTER — Ambulatory Visit (INDEPENDENT_AMBULATORY_CARE_PROVIDER_SITE_OTHER): Payer: Medicare Other | Admitting: Internal Medicine

## 2022-12-16 ENCOUNTER — Encounter: Payer: Medicare Other | Admitting: *Deleted

## 2022-12-16 VITALS — BP 120/60 | HR 93 | Ht 60.0 in | Wt 175.0 lb

## 2022-12-16 DIAGNOSIS — J9611 Chronic respiratory failure with hypoxia: Secondary | ICD-10-CM | POA: Diagnosis not present

## 2022-12-16 DIAGNOSIS — Z006 Encounter for examination for normal comparison and control in clinical research program: Secondary | ICD-10-CM

## 2022-12-16 DIAGNOSIS — J849 Interstitial pulmonary disease, unspecified: Secondary | ICD-10-CM

## 2022-12-16 DIAGNOSIS — Z79899 Other long term (current) drug therapy: Secondary | ICD-10-CM

## 2022-12-16 DIAGNOSIS — Z7185 Encounter for immunization safety counseling: Secondary | ICD-10-CM

## 2022-12-16 DIAGNOSIS — Z5181 Encounter for therapeutic drug level monitoring: Secondary | ICD-10-CM

## 2022-12-16 DIAGNOSIS — I272 Pulmonary hypertension, unspecified: Secondary | ICD-10-CM | POA: Diagnosis not present

## 2022-12-16 DIAGNOSIS — Z7189 Other specified counseling: Secondary | ICD-10-CM

## 2022-12-16 NOTE — Progress Notes (Signed)
05/21/2018- 77 year old female never smoker followed for allergic rhinitis, rhinosinusitis, asthmatic bronchitis, ILD, OSA,complicated by GERD/Crohn's, Rheumatoid arthritis/prednisone -----4 month follow up for asthma and OSA. Per patient she is still having issues with SOB. She has had 2 foot surgeries since her last visit. States that the [name] is working but [name] wanted to switch her to something else. Unsure of the medication name and I did not see it in his last OV.  Singulair, and Anoro, neb duoneb CPAP auto 5-15/Advanced   Download 100% compliance AHI 1.3/hour. I discussed FENO and she will try Symbicort. Discussed inhaled steroids.  We need to get her a new AeroChamber. Has been on sustained antibiotics for failed appliance in her foot. Notices some runny nose and scant bland mucus.  Wheezing has been well controlled.  Little cough.  She and her husband recognize inability to get any exercise as a substantial reason for dyspnea.  No acute events.  Cardiology continues to follow, including her pulmonary hypertension.  We reviewed her most recent chest CT and discussed implications of progressive interstitial fibrosis.  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  I would like to refer her to [name] for his opinion and advice. FENO 03/15/18  79 H CT chest Hi Res 02/02/2018- IMPRESSION: 1. Spectrum of findings compatible with fibrotic interstitial lung disease with a slight basilar predominance, with mild progression since 2017. 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 2. Mild patchy air trapping, unchanged, indicative of small airways disease. 3. Left main and two-vessel coronary atherosclerosis. 4. Stable dilated main pulmonary artery, suggesting chronic pulmonary arterial hypertension. Aortic Atherosclerosis (ICD10-I70.0).  06/08/18-  77 year old female never smoker followed for allergic rhinitis, rhinosinusitis, asthmatic bronchitis, ILD, OSA,complicated by GERD/Crohn's, Rheumatoid arthritis/prednisone -----coughing for 2 weeks, yellow, green mucus, SOB  Singulair, Atrovent nasal spray, Symbicort 160, and Anoro, neb albuterol, neb DuoNeb, Complains of increased cough with chest congestion, green/yellow sputum, low-grade fever 99 degrees over the last 2 weeks.  Not on prednisone now.  Last antibiotic in October. Pending ILD consultation with [name] in December.   07/02/2018  - Visit   77 year old female patient presenting today for acute visit.  Patient was recently treated with a course of Augmentin and reports sputum color is now light green but has decreased in sputum amount since last office visit.  Patient does not feel she is getting better.  Patient remains adherent to Anoro Ellipta.  On arrival to our office today patient was short of breath and oxygen saturations were 81%.  With deep breathing patient was able to increase oxygenation to 85%.  When placed on 2 L O2 patient was able to maintain at 91%.  Patient had previously been on oxygen in 2015 but this was later [name].  Patient's weight has also increased over the previous office visits, patient remains adherent to diuretic treatment plan as on taking 1 diuretic daily.  Patient reports she is allowed to take an additional diuretic when she feels necessary.  Unfortunately patient has not been weighing herself regularly there is no set plan to when she should take her second diuretic.  Patient is exceptionally sedentary at home and rarely gets out of her bed or lazy boy chair which is about 3 feet from her bed.  Patient's husband takes care of patient completely including getting drinks and food so patient does not have to leave her lazy boy  recliner.  Patient watches many television shows and does not report much activity.   Patient has completed follow-up with  rheumatology last week and no changes to her medications were made.  Patient reports that she was stable at the time.  CPAP compliance report showing 30 out of 30 days used.  All 30 those days greater than 4 hours.  APAP settings 5-15.  Average usage days 7 hours and 56 minutes.  AHI 0.8.    OV 07/31/2018 -referred to interstitial lung disease clinic  Subjective:  [name], female , DOB: 1945-09-21 , age 77 y.o. , MRN: [id number] , ADDRESS: [address] [place] [address]   07/31/2018 -   Chief Complaint  Patient presents with   Follow-up    Pt of Dr. Maple Hudson that stated to be referred to ILD clinic.  Pt currently has complaints of wheezing which she has had since December 2019, worsening SOB when moving around and also states she has a pain in left side of chest near shoulder. Pt wears between 2-4L O2 but mainly has been wearing 4L at home.      [name] 77 y.o. -accompanied by her husband.  Referred by Dr. Jetty Duhamel pulmonologist in our practice for evaluation of interstitial lung disease in the setting of rheumatoid arthritis.  History is gained from her, talking to her husband, review of recent office visits and also the interstitial lung disease questionnaire.  As best as I can gather   [name]  Symptoms: She is known to have ILD on a CT chest in 2017 but she is not aware of it.  She says that she follows normally for sleep apnea and asthma with Dr. Jetty Duhamel.  She recollects that approximately 11 or 12 years ago she was hospitalized for pneumonia and was on oxygen for short while not otherwise specified.  Then came off oxygen.  After that overall she is been stable and sedentary using her CPAP.  She is mostly sedentary because of obesity, rheumatoid arthritis and also because of the nonhealing ulcer in her feet for which she is been advised sedentary lifestyle to enable healing.  Most recently to  Thanksgiving 2019 she was seen for respiratory exacerbation and given antibiotics and prednisone.  She followed up mid December 2019 and was found to have new onset hypoxemia 85% on room air.  She was then discharged on portable oxygen which she is using 4 L nasal cannula at rest.  She tells me that at home when she comes off of CPAP she is in her 50s on room air at rest.  In fact in the office today she is 83% on room air at rest but 93% on 2 L nasal cannula at rest.  There is concerned that she has worsening ILD particularly summer 2019 on her CT chest had progressive findings compared to 2 years earlier.  The presumption is that this ILD is caused by rheumatoid arthritis.  In the 2019 CT chest CT is reported as probable UIP but in my personal visualization is either indeterminate or alternative diagnosis.  In terms of symptoms: She reports insidious onset of shortness of breath for some years and gradually getting worse.  Level 2 dyspnea at rest.  Level for dyspnea taking a shower.  Level 5 dyspnea walking at her own pace of walking with others of her age and walking up stairs or walking up a hill.  She does have  a cough with yellow and green sputum.  She does cough at night.  There is also wheezing  Past Medical History : She has longstanding history of rheumatoid arthritis since the 1980s.  Used to be followed by Dr. Halina Andreas.  As best as I can gather she used to be on methotrexate maybe 10 or 20 years ago.  She took it for a short while and then stopped it because of diarrhea.  She took this for less than 1 year.  She believes that after that she was basically on pain management.  She is only been on prednisone chronic intake for a total of 1 year approximately 4 to 5 years ago.  Around this time Dr. Otelia Limes retired and she started seeing Dr. Zenovia Jordan.  Since then she is been on Arava and Plaquenil.  She has never seen any other immunomodulators in the setting of rheumatoid arthritis  according to history.  Overall the rheumatoid arthritis has caused some disability with deformed joints in her hands and also her using cane.    She is not on any anticoagulation is never had any blood clots.  In 2017 she had vascular extremity duplex venous that was negative for DVT.  Most recently she is been dealing with nonhealing ulcer of the left toe/metatarsal .  She had August and September 2018.  And then 1 in the latter part of 2019.  Most recently she says she has had a duplex lower extremity venous.  I do not have access to these results.  She is waiting on this.  Was done in the last few to several days.   She also has a longstanding history of asthma for which she is on Singulair.  Exam nitric oxide was elevated in August 2019 at 49 ppb but 07/31/2018 I snormal in the 20s though she is wheezing   She has nonobstructive coronary artery disease and in March 2018 had a right heart catheterization with elevated pulmonary pressures but also slightly elevated wedge pressure [see below].  She is started on Lasix  She has sleep apnea for which she uses CPAP.  She has obesity  ROS: Positive for arthralgia and arthritis.  Dry eyes and dry mouth but otherwise negative   FAMILY HISTORY of LUNG DISEASE: Denies   EXPOSURE HISTORY: Never smoked any cigarettes, marijuana, vaping, cocaine or intravenous drug use   HOME and HOBBY DETAILS : Single-family home [place] lived there for 8 years.  No mold.  No dampness.  Does use humidifier and does use CPAP but there is no mold in it.  Also uses nebulizer machine but there is no mold.  No pet birds no musical instruments no guarding habits   OCCUPATIONAL HISTORY (122 questions) : Denies   PULMONARY TOXICITY HISTORY (27 items): She has taken nitrofurantoin 9 years ago.  Has not taken methotrexate for a year 5 years ago and is taken sulfasalazine       Results for INIYAH, PINERA (MRN [id number]) as of 07/31/2018 16:31  Ref. Range  09/03/2015 12:45 02/21/2017  FVC-Pre Latest Units: L 2.18 1.7L  FVC-%Pred-Pre Latest Units: % 84 66%   Results for YUDI, PELCZAR (MRN [id number]) as of 07/31/2018 16:31  Ref. Range 09/03/2015 12:45  DLCO cor Latest Units: ml/min/mmHg 14.06  DLCO cor % pred Latest Units: % 69   HEART CATH MARCH 2018 Right Heart Pressures RA (mean): 13 mmHg RV (S/EDP): 38/12 mmHg PA (S/D, mean): 43/23 (32) mmHg PCWP (mean): 16 mmHg  Conclusions: Mild to moderate, non-obstructive coronary artery disease with 50% mid LAD and 20% proximal RCA stenoses. Mild to moderate pulmonary hypertension with mildly elevated right heart filling pressures. Upper normal left heart filling pressures. Normal Fick cardiac output/index. Equalization of end-diastolic pressures with ventricular concordance. This can be seen in the setting of restrictive process.   Recommendations: Escalate statin therapy to prevent progression of CAD. Increase furosemide to 40 mg BID and KCl to 40 mg BID. Patient to have BMP in 1 week to evaluate renal function and potassium. Continue outpatient pulmonary follow-up. I suspect underlying lung disease is the driving force behind the patient's dyspnea, pulmonary hypertension, and elevated right heart pressures. Outpatient follow-up in cardiology clinic in ~6 weeks.   [name], MD   CT chest high res July 2019 - visualized personally - personaly opinion - indeterminate or alt dx for UIP but agree with progression  IMPRESSION: Lungs/Pleura: No pneumothorax. No pleural effusion. No acute consolidative airspace disease or lung masses. Few scattered small solid pulmonary nodules in mid to upper right lung, largest 4 mm in the right upper lobe (series 3/image 46), unchanged since 03/14/2016 CT, considered benign. No new significant pulmonary nodules. Mild patchy air trapping in both lungs on the expiration sequence, unchanged. Patchy peribronchovascular and subpleural reticulation and  ground-glass attenuation throughout both lungs with associated mild traction bronchiectasis and mild architectural distortion. There is a slight basilar gradient to these findings. Tiny focus of honeycombing in anterior right upper lobe (series 8/image 59). These findings have mildly worsened since 03/14/2016 chest CT. 1. Spectrum of findings compatible with fibrotic interstitial lung disease with a slight basilar predominance, with mild progression since 2017. 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 2. Mild patchy air trapping, unchanged, indicative of small airways disease. 3. Left main and two-vessel coronary atherosclerosis. 4. Stable dilated main pulmonary artery, suggesting chronic pulmonary arterial hypertension.   Aortic Atherosclerosis (ICD10-I70.0).     Electronically Signed   By: Delbert Phenix M.D.   On: 02/02/2018 14:03        OV 03/15/2019  Subjective:  [name], female , DOB: 08/07/1945 , age 64 y.o. , MRN: [id number] , ADDRESS: [address] [place] [address]   03/15/2019 -   Chief Complaint  Patient presents with   IPD (Interstitial pulmonary disease)    Breathing is doing better since being on Esbriet. Has productive cough with green phelgm. Has been on antibiotic for last 6 weeks. Had hip replacement in June.     Hailey [name] 77 y.o. 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  Started on Esbriet in March 2020.  Has progressive phenotype.   She presents for routine follow-up with her husband.  She tells me that ever since starting Esbriet in the spring 2020 she has been doing well.  She does not feel that the disease has progressed.  In fact she feels somewhat better.  She continues to use 4 L of oxygen.  She is now taking the Esbriet at 1 pill 3 times daily.  This is the  full dose of 1 pill.  Her last pulmonary function test was in early 2020.  It has been a while since she did her liver function test and she requires a repeat of that today.  She is willing to have her flu shot today.  She is asking for prescription for  emergency electric generator.  She does not know about the [employer] energy assistance program.  She has a handicap sticker.  She is interested in the ILD-PRO research registry study.    She is on Trelegy inhaler and Singulair I am not fully clear why   In June 2020 she had hip replacement for stress fracture and was on 6 weeks of Keflex.  She is slowly improving.  She uses a walker.  OV 05/17/2019  Subjective:  [name], female , DOB: [birth year] , age 54 y.o. , MRN: [id number] , ADDRESS: [address] [place] [address]   05/17/2019 -   Chief Complaint  Patient presents with   Follow-up    Patient reports that she has had a productive cough with yellow sputum and her PCP placed her on an antibiotic.    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  Started on Esbriet in March 2020.  Has progressive phenotype. COVID negative 04/25/2019,. On 02 4L [place] since end 2019  Also hx of asthma  Also hx of chronic sinusitis - 2017 CT maxillary sinusits - DR Annalee Genta  History of sleep apnea on CPAP  Hailey [name] 77 y.o. -presents to the ILD clinic for the above issues.  She continues on pirfenidone since March 2020.  She says this is helping her.  No adverse effects.  Last liver function test was August 2020.  She needs another liver function test right now.  No nausea vomiting or diarrhea or weight loss.  She continues on 4 L of oxygen continuously 24/7.  At night she uses CPAP for her sleep apnea.  She and her husband think that she needs 6 L of oxygen at night and they are trying to increase the oxygen.  Although this is not based on data but on subjective reasoning.     New issue associated with sinus issues: Her main issue today is that for the last 3-week she has had worsening of her cough and there is associated yellow sputum.  Her COVID-19 test was -2 days ago.  This been going on for 3 weeks.  She tells me this happens to her every winter.  She is complaining of chronic postnasal drainage.  In 2017 she had a CT scan of the sinus that showed maxillary sinusitis and saw Dr. Annalee Genta but since then has not followed up.  She does not have a history of sinus surgery.  She is frustrated by the cough and the yellow sputum.  Her pulmonary function test itself is stable since she started the Esbriet.  She is interested in the ILD pro registry study.  In terms of her asthma: She is on Trelegy and Singulair.  She does not want to stop this.  There is a clinical history.  She says that if she stops this it gets worse.   In terms of her sleep apnea: She continues on CPAP with oxygen at night.    ROS    IMPRESSION: CT chest Jan  2020 1. Severe tracheobronchomalacia, more pronounced on today's scan. 2. Interval progression of basilar predominant fibrotic interstitial lung disease with particularly increased ground-glass component. No honeycombing. 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 Findings are consistent with UIP per consensus guidelines: Diagnosis of Idiopathic Pulmonary Fibrosis: An Official ATS/ERS/JRS/ALAT Clinical Practice Guideline. Am Rosezetta Schlatter Crit Care Med Vol 198, Iss 5, [id number], Mar 18 2017. 3.  Stable dilated main pulmonary artery, suggesting chronic pulmonary arterial hypertension. 4. Left main and 3 vessel coronary atherosclerosis.   Aortic Atherosclerosis (ICD10-I70.0).     Electronically Signed   By: Delbert Phenix M.D.   On: 08/20/2018 08:21   - per Hailey  OV 04/02/2020  Subjective:  [name], female , DOB: [birth year] , age 16 y.o. ,  MRN: [id number] , ADDRESS: [address] 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  Started on Esbriet in March 2020.  Has progressive phenotype. COVID negative 04/25/2019,. On 02 4L [place] since end 2019  - on ild pro registry  Also hx of asthma -Singulair and inhaler  Also hx of chronic sinusitis - 2017 CT maxillary sinusits - DR Annalee Genta  History of sleep apnea on CPAP  Rheumatoid arthritis seen by Dr. Zenovia Jordan on leflunomide, Plaquenil and daily prednisone 5 mg/day.  04/02/2020 -   Chief Complaint  Patient presents with   Follow-up    pt is here to go over pft results     Hailey [name] 77 y.o. -presents for the above issues of interstitial lung disease in the setting of connective tissue disease.  She is on leflunomide and Plaquenil and prednisone with her rheumatologist.  She is on pirfenidone for her ILD.  I personally last saw her almost a year ago in October 2020.  After that she is seen nurse practitioner.  In the interim she says she is overall stable with the symptom score show a different story she has had significant worsening of her symptoms.  She continues to be morbidly obese.  Walking desaturation test she is desaturating to the point she is requiring 6 L to correct.  In fact early in August 2020 [place] and was extremely short of breath.  Even at rest on 3 L she was 89%.  She continues to use a CPAP.  Her husband who is here with her today would like her to lose weight in order to help her health status.  Pulmonary function test shows also a decline.  Her last CT scan of the chest was in June 2020    OV 05/13/2020   Subjective:  [name], female , DOB: August 31, 1945, age 34 y.o. years. , MRN: [id number],  ADDRESS: [address] 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    - Started on Esbriet in March 2020.  Has progressive phenotype. COVID negative 04/25/2019,. On 02 4L  since end 2019  - on ild pro registry  Also hx of asthma -Singulair and inhaler  Also hx of chronic sinusitis - 2017 CT maxillary sinusits - DR Annalee Genta  History of sleep apnea on CPAP  Rheumatoid arthritis seen by Dr. Zenovia Jordan on leflunomide, Plaquenil and daily prednisone 5 mg/day.  Elevated pulmonary artery pressures but PVR less than 3 in 2018: Right Heart Pressures RA (mean): 13 mmHg RV (S/EDP): 38/12 mmHg PA (S/D, mean): 43/23 (32) mmHg PCWP (mean): 16 mmHg  Ao sat: 94% PA sat: 70% RA sat: 75%  Fick CO: 6.4 L/min Fick  CI: 3.7 L/min/m^2  PVR: 2.5 Wood units     Chief Complaint  Patient presents with   Follow-up    more sob walking shorter distances.  coughing up yellow phlegm.       Hailey [name] 77 y.o. -last visit September 2021.  At that time concern for progressive ILD.  Therefore we decided to PFTs and also high-resolution CT chest.  The high-resolution CT chest compared to last year shows stability.  The PFTs show stability within this year but progression compared to last year.  She is also lost weight following a low carbohydrate diet.  Nevertheless she feels the weight of her symptoms.  She is also using oxygen with exertion.  She feels this is getting worse.  Although her overall symptom burden seems to be improved with the weight loss.  Early morning she says there are some yellow phlegm which is stable for many many years.  This is mostly sinus.  There is no change in this.  She continues on immunosuppressants and antifibrotic.  There is no  side effects from these.  She is willing to have labs checked.  She has had a Covid vaccine including booster.  She does not know if she has antibody response yet.  She is willing to get this checked.  She is looking at options to get better.  She will participate in the ILD-pro registry visit today.  Review of the records indicate the last right eye catheterization was in 2018.  Pressures were borderline at that time.  She is willing to get this checked.  Her cardiologist is in [place].  She wants to get the procedure done in [place] and is willing to see Dr. Gala Romney or Dr. Shirlee Latch who are well versed with right heart catheterization locally.    Wt Readings from Last 3 Encounters:  05/13/20 202 lb 12.8 oz (92 kg)  04/02/20 207 lb 9.6 oz (94.2 kg)  02/05/20 218 lb (98.9 kg)       TECHNIQUE: CT chest sept 2021 Multidetector CT imaging of the chest was performed following the standard protocol without intravenous contrast. High resolution imaging of the lungs, as well as inspiratory and expiratory imaging, was performed.   COMPARISON:  08/16/2018, 02/02/2018, 03/14/2016, 08/27/2015   FINDINGS: Cardiovascular: Aortic atherosclerosis. Normal heart size. Three-vessel coronary artery calcifications. No pericardial effusion.   Mediastinum/Nodes: No enlarged mediastinal, hilar, or axillary lymph nodes. Thyroid gland, trachea, and esophagus demonstrate no significant findings.   Lungs/Pleura: Unchanged moderate pulmonary fibrosis in a pattern with apical to basal gradient featuring irregular peripheral interstitial opacity, septal thickening, ground-glass, and minimal tubular bronchiectasis without evidence of bronchiolectasis or honeycombing. Fibrotic findings are not significantly changed compared to immediate prior examination but significantly worsened over time on examinations dating back to 08/27/2015 occasional stable, definitively benign small pulmonary nodules, for  example a 3 mm nodule of the anterior right upper lobe (series 10, image 77). No significant air trapping on expiratory phase imaging. No pleural effusion or pneumothorax.   Upper Abdomen: No acute abnormality.   Musculoskeletal: No chest wall mass or suspicious bone lesions identified.   IMPRESSION: 1. Unchanged moderate pulmonary fibrosis in a pattern with apical to basal gradient featuring irregular peripheral interstitial opacity, septal thickening, ground-glass, and minimal tubular bronchiectasis without evidence of bronchiolectasis or honeycombing. Fibrotic findings are not significantly changed compared to immediate prior examination but significantly worsened over time on examinations dating back to occasional 08/27/2015. Findings remain consistent with a "probable UIP" pattern by  pulmonary fibrosis criteria and generally in keeping with reported history of rheumatoid arthritis and associated fibrotic interstitial lung disease. 2. Coronary artery disease.  Aortic Atherosclerosis (ICD10-I70.0).     Electronically Signed   By: Lauralyn Primes M.D.   On: 04/15/2020 15:35     OV 08/19/2020  Subjective:  [name], female , DOB: Dec 23, 1945 , age 58 y.o. 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    - Started on Esbriet in March 2020.  Has progressive phenotype. COVID negative 04/25/2019,. On 02 4L [place] since end 2019  - on ild pro  registry  Also hx of asthma -Singulair and inhaler  Also hx of chronic sinusitis - 2017 CT maxillary sinusits - DR Annalee Genta  History of sleep apnea on CPAP  Rheumatoid arthritis seen by Dr. Zenovia Jordan on leflunomide, Plaquenil and daily prednisone 5 mg/day.    Hailey [name] 77 y.o. -presents with her husband.  Last seen in October 2021.  After that in January 2022 she was supposed to have a right heart catheterization because of concern of progressive ILD and her having autoimmune phenotype.  However middle of January 2022 she got sick with bronchitis.  She and her husband say that she gets this sick every year.  They do not know if it was Covid because she did not get tested.  She says primary care physician treated her with 10 days of 20 mg/day prednisone [at baseline she is on 5 mg/day of].  And also doxycycline for 10 days.  This is not helped.  Therefore she is now on amoxicillin and a second course of prednisone 20 mg/day for 10 days.  Her baseline dose is 5 mg prednisone.  She is not feeling better.  In fact her symptom scores are much worse.  Her pulse ox on room air at rest was 88%.  Previously room air at rest was fine.  She is frustrated by her symptoms.  She and her husband are frustrated by the symptom burden.  She is requesting Phenergan with codeine for cough control.     OV 09/03/2020  Subjective:  [name], female , DOB: 05/14/1946 , age 22 y.o. , MRN: [id number] , ADDRESS: [address] [place] [address] PCP Burton Apley, MD Patient Care Team: Burton Apley, MD as PCP - General (Internal Medicine) End, Cristal Deer, MD as PCP - Cardiology (Cardiology) Zenovia Jordan, MD as Consulting Physician (Rheumatology)  This Provider for this visit: Treatment Team:  Attending Provider: Kalman Shan, MD  Type of visit: Telephone/Video Circumstance: COVID-19 national emergency Identification of patient NOKOMIS KAZMER with 1945-10-07  and MRN [id number] - 2 person identifier Risks: Risks, benefits, limitations of telephone visit explained. Patient understood and verbalized agreement to proceed Anyone else on call: just patient Patient location: [phone] This provider location: [address]   09/03/2020 -  No chief complaint on file.    Hailey [name] 77 y.o. -  this telephone visit this telephone visit is to follow-up from recent exacerbation early February 2022.  She is finished her prednisone she is feeling back to baseline.  She says her sinuses are congested but she will follow-up with the ENT.  She is still waiting for a right heart catheterization.  She has follow-up later in March 2022 with me.  Oxygen use is baseline.  She had Covid IgG checked and this was normal.  She continues to use a CPAP.  She is regular with all her medications.  She took consent form for PULSE inhaled nitric oxide study and she is interested.  Recent lab review shows slightly high calcium.  She does not know that she has this problem    PFT   OV 10/15/2020  Subjective:  [name], female , DOB: [birth year] , age 64 y.o. , MRN: [id number] , ADDRESS: [address] [place] [address] PCP Burton Apley, MD Patient Care Team: Burton Apley, MD as PCP - General (Internal Medicine) End, Cristal Deer, MD as PCP - Cardiology (Cardiology) Zenovia Jordan, MD as Consulting Physician (Rheumatology)  This Provider for this visit: Treatment Team:  Attending Provider: Kalman Shan, MD    10/15/2020 -   Chief Complaint  Patient presents with   Follow-up    She felt she was breathing deeper before pft.      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      - Started on Esbriet in March 2020.  Has progressive phenotype. COVID negative 04/25/2019,. On 02 4L [place] since end 2019   - on ild pro registry  - last CT Sept 2021  - started PULSE iNO study march  2022 via study protocol -randomized trial  Also hx of asthma -Singulair and inhaler trelegy  Also hx of chronic sinusitis - 2017 CT maxillary sinusits - DR Annalee Genta  History of sleep apnea on CPAP  Rheumatoid arthritis seen by Dr. Zenovia Jordan on leflunomide, Plaquenil and daily prednisone 5 mg/day.  Positive Covid IgG - Feb 2022   Hailey [name] 77 y.o. -presents for standard of care visit.  She is now on inhaled nitric oxide study.  The inhaled nitric oxide is being bled through her oxygen.  She has to wear 12 hours a day.  She was randomized 2 days ago.  She tells me for the last 4 days she is having sinus complaints of yellow sinus drainage and increased cough.  She feels as acute sinusitis and she will benefit from antibiotics and prednisone.  Specifically she did not mention this at the time of randomization.  She denied this at the time of randomization but now she says it was present for 4 days.  She had pulmonary function test that shows continued decline in ILD.  Her oxygen use remains the same at 4 L and with exertion she uses 5 L.  She is on Trelegy for asthma.  Her CT scan of the chest in September did not show any evidence of emphysema.  She uses CPAP for her sleep apnea.  Labs from research were reviewed by me.  Her most recent hemoglobin is 12.  Creatinine and liver function test was normal.  Calcium is not available.   Of note after starting the inhaled nitric oxide she feels she can breathe more deeper.  However his symptom score shows continued decline.    OV 04/27/2021  Subjective:  [name], female , DOB: 08-14-1945 ,  age 71 y.o. , MRN: [id number] , ADDRESS: [address] [place] [address] PCP Burton Apley, MD Patient Care Team: Burton Apley, MD as PCP - General (Internal Medicine) End, Cristal Deer, MD as PCP - Cardiology (Cardiology) Zenovia Jordan, MD as Consulting Physician (Rheumatology)  This Provider for this  visit: Treatment Team:  Attending Provider: Kalman Shan, MD    04/27/2021 -   Chief Complaint  Patient presents with   Follow-up    Pt states she has been sick for about 6 weeks. Has been on prednisone as well as abx and is still not over it. States she is coughing getting up yellow-green phlegm and also has had some wheezing. Also has had increased SOB.      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    - Started on Esbriet in March 2020.  Has progressive phenotype. COVID negative 04/25/2019,. On 02 4L [place] since end 2019   - on ild pro registry  - started PULSE iNO study march 2022 via study protocol -randomized trial -> randomized to Open Label in sept 2022  - RHC 11/09/20   -   RA = 12 RV = 52/14 PA = 55/25 (38) PCW = 19 Fick cardiac output/index = 5.8/3.1 Thermo CO/CI = 7.4/4.0 PVR = 3.3 WU (Fick) 2.6 WU (thermo) FA sat = 99% PA sat = 71%, 71% SVC sat = 76% Given relatively normal PVR may not benefit much from Tyvas - per cads    Also hx of asthma -Singulair and inhaler trelegy  - 0% eoios last checked 2019  Also hx of chronic sinusitis  - 2017 CT maxillary sinusits - DR Annalee Genta  - CT sinus nov 2020 - celar  History of sleep apnea on CPAP  Rheumatoid arthritis seen by Dr. Zenovia Jordan on leflunomide, Plaquenil and daily prednisone 5 mg/day.  Positive Covid IgG - Feb 2022   Hailey [name] 77 y.o. -presents for standard of care visit to the ILD center with [name].  She presents with her husband.  She tells me that since summer 2022 she is having increased sinus drainage.  For the last 6 weeks she is got more cough.  She feels there is a frog in the throat.  She brings yellow drainage from the sinuses.  The drainage increases with Nettie pot saline wash.  Sometimes she is clearing this from the throat.  Her husband says this happens every winter.  2 years ago we did CT scan of the sinus and it was  clear.  She has been on 2 antibiotics.  She felt cephalexin and azithromycin did not help.  She prefers to have doxycycline.  She has been on 1 prednisone 10-day taper.  She feels that she wants another course of doxycycline and prednisone.  She also states that if she takes antibiotics she will want Diflucan.  Her last sinus CT was 2 years ago and her last CT chest was 1 year ago.  She also reports cough and chest tightness.  Worsening shortness of breath.  Despite subjective reporting of worsening shortness of breath subjective objective dyspnea score on the scoring scale looks the same  She continues on antifibrotic pirfenidone.  She is tolerating this well.  She is also on inhaled nitric oxide open label with the oxygen.  Her pulmonary function test shows continued decline.  Because of her symptoms we did a nitric oxide exhaled test and this was high.  Unclear if this because  of inhaled nitric oxide.  Research of the study sponsor on this.    Lab Results  Component Value Date   NITRICOXIDE 139 04/27/2021   Xxxxxxxxxxxxxxxxxxxxxx  05/24/2021 -  Followup Test results - video visit   Hailey [name] 77 y.o. - standard of care visit to discuss CT  and lab results  Per research team:  She rated her shortness of breath today at a 5 out of 5 today and has been since July 18th, before it was 4 out of 5. Her Borg Dyspnea score was 3 (moderate) at rest the last two visits and before that only rated it 1 (slight) back in June. On 6wmd: he walked 153 meters today compared to 120 meters on 03/29/2021. SHe is currently on Open Labvel extensio of the inhaled NO Study-she was able to walk just since 6 L.  She is now on scheduled to see research team every 4 months.    - 162 m start of study - 167 m I June 2022 - 120 m sept 2022 -> open labe inhaled NO rolled over July 2022 - 18m 05/24/21 - needed 6L . -> improved  Labs  - ANCA - normal - IgE and RAST allergy panel negative - PFT shows  continued decline    Overall it appears based on pulmonary function test and CT scan of the chest she is declined significantly.  She also has severe tracheobronchomalacia which has not been documented in the prior CT scan.  I did discuss the progression with her.  She indicated to me that she was worried about her progression.  She appears to be on optimal antifibrotic therapy with pirfenidone.  We reviewed her rheumatoid arthritis medication she no longer on Plaquenil.  Recent documented above.  Subsequently did discuss with Dr. Zenovia Jordan.  We considered possibility of using Actemra which in scleroderma ILD is shown lung stabilization/modulation effect.  She will try to make a switch for this if insurance would approve.  Did indicate to patient that if she continued to decline then we would involve palliative care at home.  At this point in time we resolved that she will continue with the research protocol. The tracheobronchomalacia is the likely reason for exertional wheezing.      OV 09/16/2021  Subjective:  [name], female , DOB: [birth year] , age 77 y.o. , MRN: [id number] , ADDRESS: [address] [place] [address] PCP Burton Apley, MD Patient Care Team: Burton Apley, MD as PCP - General (Internal Medicine) End, Cristal Deer, MD as PCP - Cardiology (Cardiology) Zenovia Jordan, MD as Consulting Physician (Rheumatology)  This Provider for this visit: Treatment Team:  Attending Provider: Kalman Shan, MD    09/16/2021 -   Chief Complaint  Patient presents with   Follow-up    Pt states her breathing has become better since stopping the nitric oxide that she was doing with research. States she does have an occasional cough.     Hailey [name] 77 y.o. -presents with her husband.  On 08/24/2021 she completely withdrew from the inhaled nitric oxide open label extension part of the study.  The coordinator did mention that to me back then.  She  is simply not interested in the study.  In fact she refused even participate in the follow-up visits without the investigational medical product.  She just wants to be done with it.  She was frustrated with the device in the machine and the noise.  In addition she felt that the inhaled nitric oxide was making the sinus situation worse.  She believes that after going to open label extension she got worse.  Since stopping the inhaler anticoagulate her sinus complaints are better.  She feels also is that she is less short of breath.  Of note she has now had 3 doses once monthly of Actemra for rheumatoid arthritis.  Correlating with this the husband notices that she is using her cane less and she is in less pain and she is moving better. For chronic respiratory failure she uses 6 L nasal cannula continuous at rest and with exertion.  Overall she feels better.  Symptom score is below.  She still continues to participate in the ILD-Pro registry and today she has a visit for that.        OV 12/08/2021  Subjective:  [name], female , DOB: Jan 10, 1946 , age 59 y.o. , MRN: [id number] , ADDRESS: [address] [place] [address] PCP Burton Apley, MD Patient Care Team: Burton Apley, MD as PCP - General (Internal Medicine) End, Cristal Deer, MD as PCP - Cardiology (Cardiology) Zenovia Jordan, MD as Consulting Physician (Rheumatology)  This Provider for this visit: Treatment Team:  Attending Provider: Kalman Shan, MD    12/08/2021 -   Chief Complaint  Patient presents with   Follow-up    PFT performed today.  Pt states she feels like her breathing has gotten better since last visit.    [name] 77 y.o. - presemnt wth husband. Reports now needing 7L based on subjective needs. Taking mucinex. Cough some more productive than useal but main thing is dyspnea  scor worse. Last RHC over a year ago.  Taking esbriet, actemra, prednisone and arava. Uses  walker. HAs Left hip pain and also left shoulder pain. Not a surgical candidate. PFT today shows some decline compared to a year ago. Explained to her that while on iNO study her PFT actually improved in the short run. She finds that difficult to fathom because she did not tolerate the OLE   12/24/2021: Virtual visit with Clent Ridges NP post RHC for [facility]. She had mild to moderate PAH with elevated PVR. Started on Tyvaso DPI. Continued on Esbriet 801 mg tid with plans to repeat HRCT in October 2023. Continued on Trelegy and PRN nebs/albuterol for COPD. Excellent compliance with CPAP use. O2 stable on 7 lpm supplemental O2. Follow up in one month and then in August with [name].   03/17/2022: Today - follow up Patient presents today with husband for follow up after repeat pulmonary function testing, which were overall stable. She started on Tyvaso about 2 weeks ago. She does feel like it has helped her breathing and like she doesn't have as much chest congestion/tightness. She still has very high symptom burden with limited activity tolerance. She's unable to do much around the house besides basic ADLs. Her husband is very supportive. They do go out to eat for lunch often and she gets winded walking from the parking lot to the oxygen. Cough is minimal. No wheezing, hemoptysis, fevers, increased leg swelling, orthopnea (on CPAP), PND. She continues on supplemental oxygen 7 lpm and maintains oxygen levels in the 90's. She wears CPAP nightly. Denies morning headaches or drowsy driving.     OV 62/95/2841  Subjective:  [name], female , DOB: [birth year] , age 15 y.o. , MRN: [id number] , ADDRESS: [address] [place] [address] PCP  Burton Apley, MD Patient Care Team: Burton Apley, MD as PCP - General (Internal Medicine) End, Cristal Deer, MD as PCP - Cardiology (Cardiology) Zenovia Jordan, MD as Consulting Physician (Rheumatology)  This Provider for this visit: Treatment  Team:  Attending Provider: Kalman Shan, MD    06/07/2022 -   Chief Complaint  Patient presents with   Follow-up    PFT performed today.  Pt states she has been worse since last visit. Was recently in the hospital due to cellulitis of left leg.   Hailey ELZA HAUFE 77 y.o. -returns for follow-up.  Have not seen her since the spring 2023.  Since then she continues to be on pirfenidone.  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  She is on the full dose but she forgets to take it half the time so she takes DPI 2 times daily.  In October 2023 she got hospitalized for nonhealing ulcers and bad cellulitis of bilateral shin.  Notes reviewed.  Husband showed pictures.  They are slowly improving although the left lower extremity shin is still nonhealing.  Her oxygen use continues to be at 7 L/min.  Her pulmonary function test shows continued decline.  She continues to be on Symbicort and other medications.  In terms of her rheumatoid arthritis Actemra is on hold but she is on leflunomide prednisone    OV 08/11/2022  Subjective:  [name], female , DOB: September 12, 1945 , age 78 y.o. , MRN: [id number] , ADDRESS: [address] [place] [address] PCP Burton Apley, MD Patient Care Team: Burton Apley, MD as PCP - General (Internal Medicine) End, Cristal Deer, MD as PCP - Cardiology (Cardiology) Zenovia Jordan, MD as Consulting Physician (Rheumatology)  This Provider for this visit: Treatment Team:  Attending Provider: Kalman Shan, MD    08/11/2022 -   Chief Complaint  Patient presents with   Follow-up    Pt is here for follow up for ILD and chronic res failure. Pt si currenlty on Trelegy daily and she states it is working well. Pt is also on Esbriet, and not having any side effects so far. Pt is on 8L of 02      Hailey DORIEN STAATS 77 y.o. -returns for follow-up.  She continues to be on 7 L  oxygen.  Her husband is providing independent history.  Husband himself is suffering from severe back pain.  They both feel she is stable.  He does say that after Dr. Visit she continues to get extremely exhausted but she is managed with 7 L.  The left lower extremity shin ulcer is still not healed.  Actemra continues to be on hold.  The husband showed me pictures.  Currently the left lower extremity is in a wrap.  It still look like 6 cm vertical ulcer with delayed wound healing but according to the husband the wound care center did indicate to them that it is healing well.  The Actemra is on hold because of this.  I did indicate to her that the medication is probably delaying wound healing is the pirfenidone because it is an antifibrotic.  However she does not want to stop this at this stage because partly encouraged by the wound care opinion that the wound is healing but also she is worried about implications of her pulmonary fibrosis failure if antifibrotic's start.  I did agree with this sentiment.  Last set of labs reviewed and they were  in the fall 2023.  Today she will have repeat set of labs.   Of note: I did review the external records from 08/05/2022 at the wound care center by [facility].  She still has not gotten covid booster and RSV vaccine    OV 12/16/2022  Subjective:  [name], female , DOB: Dec 07, 1945 , age 78 y.o. , MRN: [id number] , ADDRESS: [address] 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    - [facility] HRCT Oct 2022  - Started on Esbriet in March 2020.  Has progressive phenotype. COVID negative 04/25/2019,.   - on ild pro registry  - started PULSE iNO study march 2022 via study protocol -randomized trial -> randomized to Open Label in sept 2022 -> stopped 08/24/21 due to sinus congestion and inconvieniece     -      Also hx of chronic sinusitis  - 2017 CT maxillary sinusits - DR Annalee Genta  - CT sinus nov 2020 and October 2022- celar  - normal ANCA panel Oct 2022  History of sleep apnea on CPAP  Rheumatoid arthritis seen by Dr. Zenovia Jordan on -  leflunomide,  - daily prednisone 5 mg/day.  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     Hailey ALICEMAE ROED 77 y.o. -returns for follow-up.  Presents with her husband.  She has lost weight unintentionally to 175 pounds although she suffers from significant obesity and his weight loss is unintentional so  it is kind of worrisome for her.  In terms of the lung disease she remained stable on 7 L nasal cannula.  No pulmonary function test today.  No symptom score today.  But she does feel stable.  She continues with the pirfenidone.  Very hard for her to do things.   In terms of her asthma she continues on Singulair and Trelegy  In terms of  her rheumatoid arthritis she continues on prednisone and Areva.  The Actemra is still on hold because of her left lower extremity shin ulcer.  This is healing well at this point and is nearly healed.  She is very happy about that  RSV vaccine: She wants to get it.  Told her it is not yet available in our office for Medicare patients.  Told her to get it commercially.  ILD Pro registry: She completed visit today.  WHO group 3 pulmonary hypertension: She continues on Tyvaso without any problems.  Social - She said that she has advanced directives.  Her husband is here with her he is an independent historian and is confirmed the same.  She is still full code.  We did discuss goals of care today  -Husband is the main caretaker but he is also losing weight.  He is also lost appetite.  He has lost significant amount of weight.  He does not [place] as much is fatigue is higher.  They are doing takeout every day.  -Her daughter is going going through a divorce and she is living with them along with a 4 year old.  Her son is getting remarried Labor Day weekend and she wants to be alive for that.   SYMPTOM SCALE - ILD 03/15/2019   05/17/2019   04/02/2020   05/13/2020     08/19/2020 covid 10/15/2020   04/27/2021 210# 09/16/2021   12/08/2021 204# 03/17/2022 06/07/2022  12/16/2022 175#  O2 use x           5L [place] pule - ino study 6L [place] . Off iNO study 7L [place] wth home concerntator 7 L [place] 7L [place] 7L   Shortness of Breath 0 -> 5 scale with 5 being worst (score 6 If unable to do)                     At rest 0 0 4 0 4 2 2 0 3 0  4   Simple tasks - showers, clothes change, eating,  shaving 0 0 4 2 1 2 2 1 4 0  3   Household (dishes, doing bed, laundry) 5 6 6 5 6 6 5 3 6 1 2    Shopping 5 6 6 5 6 6 5 5 6 6 5    Walking level at own pace 3 2 4 4 2 5 4 3 4 4 3    Walking up Stairs 5 3 6 5 6 6 5 5 6 6 5    Total (40 - 48) Dyspnea Score 18 17 30 21 25 27 23 17 29 17 22    How bad is your cough? 2 3 2  0 4 3 4 2 3 1 2    How bad is your fatigue 5 5 5 4 4 6 4 3 5 5 5    nauea     0 0 0 0 0 0 0 0 3   vomit     0 0 0 0 0 0 0 0 0   diarrhea     1 2 3 1 2 1 2 2 2    anxiety     5 3 3 5 5 3 4 3 3    depression     5 3 4 5 5 3 4 3 3       No results for input(s): "AST", "ALT", "ALKPHOS", "BILITOT", "PROT", "ALBUMIN", "INR" in the last 168 hours.   PFT     Latest Ref Rng & Units 06/07/2022  9:49 AM 03/17/2022   11:22 AM 12/08/2021    3:01 PM 05/24/2021   12:43 PM 03/29/2021    2:07 PM 02/02/2021    8:12 AM 12/08/2020    9:03 AM  PFT Results  FVC-Pre L 1.47  1.53  P 1.38  1.44  1.54  2.00  1.45   FVC-Predicted Pre % 63  64  P 58  60  63  82  63   Pre FEV1/FVC % % 77  76  P 80  74  79  63  78   FEV1-Pre L 1.13  1.16  P 1.10  1.06  1.21  1.26  1.14   FEV1-Predicted Pre % 65  65  P 62  60  67  69  66   DLCO uncorrected ml/min/mmHg 6.18  6.69  P 7.13  8.17  6.95  9.22  9.05   DLCO UNC% % 36  39  P 41  47  40  53  54   DLCO corrected ml/min/mmHg 6.28  6.69  P 7.13       DLCO COR %Predicted % 36  39  P 41       DLVA Predicted % 67  71  P 74  76  72  85  96     P Preliminary result       has a past medical history of Acute asthmatic bronchitis, Allergic rhinitis, Anxiety, Arthritis, Esophageal reflux, Hypertension, Interstitial lung disease (HCC) (dx jan 2020), Irritable bowel syndrome, Oxygen dependent, PONV (postoperative nausea and vomiting), Pulmonary hypertension (HCC), Rheumatoid arthritis(714.0), and Sleep apnea.   reports that she has never smoked. She has never used smokeless tobacco.  Past Surgical History:  Procedure Laterality Date    c secttion  1977   2 foot  fusions Left    total of 6 left foot sx   ABDOMINAL HYSTERECTOMY     complete    ANKLE FUSION  2009   left   BACK SURGERY     lower l to l 5 fused   CHOLECYSTECTOMY     RIGHT HEART CATH N/A 11/09/2020   Procedure: RIGHT HEART CATH;  Surgeon: Dolores Patty, MD;  Location: [facility] INVASIVE [facility];  Service: Cardiovascular;  Laterality: N/A;   RIGHT/LEFT HEART CATH AND CORONARY ANGIOGRAPHY N/A 09/16/2016   Procedure: Right/Left Heart Cath and Coronary Angiography;  Surgeon: [name], MD;  Location: [facility] INVASIVE CV LAB;  Service: Cardiovascular;  Laterality: N/A;   RIGHT/LEFT HEART CATH AND CORONARY ANGIOGRAPHY N/A 12/17/2021   Procedure: RIGHT/LEFT HEART CATH AND CORONARY ANGIOGRAPHY;  Surgeon: Dolores Patty, MD;  Location: [facility] INVASIVE CV LAB;  Service: Cardiovascular;  Laterality: N/A;   TONSILLECTOMY     TOTAL HIP ARTHROPLASTY Right 12/20/2018   Procedure: TOTAL HIP ARTHROPLASTY ANTERIOR APPROACH;  Surgeon: Durene Romans, MD;  Location: [facility];  Service: Orthopedics;  Laterality: Right;  70 mins   TOTAL KNEE ARTHROPLASTY Bilateral     Allergies  Allergen Reactions   Cefdinir Diarrhea   Erythromycin Base Diarrhea   Lactose Dermatitis   Lactulose Diarrhea   Mesalamine Nausea Only   Methotrexate Other (See Comments)    Felt sick   Nitrofuran Derivatives     shakiness   Other Diarrhea and Other (See Comments)    Shaking uncontrollably "lettuce only" "lettuce only"   Sulfa Antibiotics Other (See Comments)    Patient can't recall reaction    Tdap [Tetanus-Diphth-Acell Pertussis]     Shaking uncontrollably  Tetanus Toxoid, Adsorbed Other (See Comments)    Shaking uncontrollable     Immunization History  Administered Date(s) Administered   Fluad Quad(high Dose 65+) 03/15/2019   Influenza Split 03/18/2012, 03/18/2013, 04/14/2014, 04/17/2017   Influenza Whole 04/30/2008, 04/20/2009, 04/18/2011   Influenza, High Dose Seasonal PF 04/23/2015, 03/24/2016, 04/06/2018,  04/02/2020, 03/24/2021, 04/13/2022   Influenza,inj,quad, With Preservative 04/06/2018   PFIZER(Purple Top)SARS-COV-2 Vaccination 08/24/2019, 09/18/2019, 04/13/2020   Pneumococcal Conjugate-13 11/11/2013   Pneumococcal Polysaccharide-23 05/21/2018   Zoster Recombinat (Shingrix) 05/05/2017    Family History  Problem Relation Age of Onset   Heart disease Mother    Arthritis Mother    Heart attack Father    Diabetes Other        sibling   Heart attack Other        sibling     Current Outpatient Medications:    albuterol (VENTOLIN HFA) 108 (90 Base) MCG/ACT inhaler, USE 2 INHALATIONS BY MOUTH EVERY 6 HOURS AS NEEDED, Disp: 34 g, Rfl: 3   aspirin EC 81 MG tablet, Take 81 mg by mouth daily., Disp: , Rfl:    benzonatate (TESSALON) 200 MG capsule, TAKE 1 CAPSULE (200 MG TOTAL) BY MOUTH 3 (THREE) TIMES DAILY AS NEEDED FOR COUGH., Disp: 90 capsule, Rfl: 2   cetirizine (ZYRTEC) 10 MG tablet, Take 20 mg by mouth daily., Disp: , Rfl:    Cholecalciferol (VITAMIN D3) 250 MCG (10000 UT) capsule, Take 10,000 Units by mouth daily., Disp: , Rfl:    clidinium-chlordiazePOXIDE (LIBRAX) 5-2.5 MG capsule, Take 1 capsule by mouth 3 (three) times daily as needed (for IBS)., Disp: , Rfl:    colestipol (COLESTID) 1 g tablet, Take 1 g by mouth daily., Disp: , Rfl:    dicyclomine (BENTYL) 20 MG tablet, Take 20 mg by mouth 4 (four) times daily as needed (IBS)., Disp: , Rfl:    ESBRIET 801 MG TABS, Take 1 tablet (801 mg total) by mouth 3 (three) times daily., Disp: 270 tablet, Rfl: 1   FLUoxetine (PROZAC) 20 MG capsule, Take 20 mg by mouth daily., Disp: , Rfl:    fluticasone (FLONASE) 50 MCG/ACT nasal spray, SPRAY 2 SPRAYS INTO EACH NOSTRIL EVERY DAY, Disp: 48 mL, Rfl: 3   furosemide (LASIX) 80 MG tablet, Take 1 tablet (80 mg total) by mouth daily., Disp: 90 tablet, Rfl: 3   ipratropium (ATROVENT) 0.03 % nasal spray, USE 1 TO 2 SPRAYS IN BOTH  NOSTRILS TWICE DAILY, Disp: 90 mL, Rfl: 3   ipratropium-albuterol  (DUONEB) 0.5-2.5 (3) MG/3ML SOLN, USE 1 VIAL IN NEBULIZER EVERY 6 HOURS AS NEEDED J45.901, Disp: 810 mL, Rfl: 3   leflunomide (ARAVA) 20 MG tablet, Take 1 tablet (20 mg total) by mouth daily., Disp: , Rfl:    loperamide (IMODIUM) 2 MG capsule, Take 1 capsule (2 mg total) by mouth 2 (two) times daily as needed for diarrhea or loose stools., Disp: 30 capsule, Rfl: 0   Menthol, Topical Analgesic, (BIOFREEZE COOL THE PAIN) 4 % GEL, Apply 1 Application topically 2 (two) times daily as needed (pain)., Disp: , Rfl:    methocarbamol (ROBAXIN) 750 MG tablet, Take 750 mg by mouth 2 (two) times daily as needed for muscle spasms., Disp: , Rfl:    montelukast (SINGULAIR) 10 MG tablet, TAKE 1 TABLET BY MOUTH AT  BEDTIME, Disp: 90 tablet, Rfl: 3   mupirocin ointment (BACTROBAN) 2 %, Apply topically daily., Disp: 22 g, Rfl: 0   omeprazole (PRILOSEC) 40 MG capsule, Take 40 mg by mouth  at bedtime. , Disp: , Rfl:    Oxycodone HCl 10 MG TABS, Take 10 mg by mouth 3 (three) times daily., Disp: , Rfl:    OXYGEN, Inhale 7 L into the lungs continuous., Disp: , Rfl:    Polyethyl Glycol-Propyl Glycol (SYSTANE) 0.4-0.3 % GEL ophthalmic gel, Place 2 application  into both eyes 2 (two) times daily as needed (dry eyes)., Disp: , Rfl:    potassium chloride SA (KLOR-CON M) 20 MEQ tablet, TAKE 1 TABLET BY MOUTH TWICE  DAILY, Disp: 180 tablet, Rfl: 1   predniSONE (DELTASONE) 5 MG tablet, Take 5 mg by mouth daily with breakfast., Disp: , Rfl:    rosuvastatin (CRESTOR) 20 MG tablet, TAKE 1 TABLET BY MOUTH DAILY, Disp: 90 tablet, Rfl: 0   traZODone (DESYREL) 100 MG tablet, Take 100 mg by mouth at bedtime., Disp: , Rfl:    TRELEGY ELLIPTA 100-62.5-25 MCG/ACT AEPB, USE 1 INHALATION BY MOUTH ONCE  DAILY AT THE SAME TIME EACH DAY, Disp: 180 each, Rfl: 3   TYVASO DPI MAINTENANCE KIT 64 MCG POWD, Inhale 1 puff into the lungs in the morning, at noon, in the evening, and at bedtime., Disp: 112 each, Rfl: 5   verapamil (CALAN-SR) 120 MG CR  tablet, Take 1 tablet (120 mg total) by mouth daily., Disp: 90 tablet, Rfl: 3   Tocilizumab (ACTEMRA IV), Inject into the vein every 30 (thirty) days. infusion (Patient not taking: Reported on 11/01/2022), Disp: , Rfl:       Objective:   Vitals:   12/16/22 1617  BP: 120/60  Pulse: 93  SpO2: 91%  Weight: 175 lb (79.4 kg)  Height: 5' (1.524 m)    Estimated body mass index is 34.18 kg/m as calculated from the following:   Height as of this encounter: 5' (1.524 m).   Weight as of this encounter: 175 lb (79.4 kg).  @WEIGHTCHANGE @  [employer]   12/16/22 1617  Weight: 175 lb (79.4 kg)     Physical Exam   General: No distress. Looks well but has lost weight O2 at rest: yes 7L [place] present: no Sitting in wheel chair: YES Frail: yes Obese: yes also Neuro: Alert and Oriented x 3. GCS 15. Speech normal Psych: Pleasant Resp:  Barrel Chest - no.  Wheeze - no, Crackles - YES, No overt respiratory distress CVS: Normal heart sounds. 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 PEERL +. No post nasal drip        Assessment:       ICD-10-CM   1. Chronic respiratory failure with hypoxia (HCC)  J96.11     2. ILD (interstitial lung disease) (HCC)  J84.9     3. Pulmonary hypertension (HCC)  I27.20     4. High risk medication use  Z79.899     5. Vaccine counseling  Z71.85     6. Therapeutic drug monitoring  Z51.81     7. Goals of care, counseling/discussion  Z71.89          Plan:     Patient Instructions     ICD-10-CM   1. Chronic respiratory failure with hypoxia (HCC)  J96.11     2. ILD (interstitial lung disease) (HCC)  J84.9     3. Pulmonary hypertension (HCC)  I27.20     4. Obstructive sleep apnea  G47.33     5. Therapeutic drug monitoring  Z51.81  6. High risk medication use  Z79.899     7. Research subject  Z00.6     8. Goals of care,  counseling/discussion  Z71.89     9. Vaccine counseling  Z71.85        -Progressive interstitial lung disease - clinically slightly progressive since May 2022 -> stable clnically on 7L [place] as of Jan 2024/May 2024 - Pulm hypertension since June 2023 and on tyvaso  - Left lowre extremity Ulcer since oct 2023 and slowly healing well as of 12/16/2022 with hold on actemra since fall 2023  - Plan ---Continue using oxygen 7 L at rest and  more  with exertion  -Monitor pulse ox with exertion and keep it over 86% -Continue participation in ILD-pro research registry -Continue pirfenidone   - if leg ulcers never heal we  might have to stop this for a month but at this point benefit of taking outweighs risk of stopping - continue tyvaso - Continue RA Rx with Dr. Zenovia Jordan: -  , leflunomade, prednisone, flonase, celebrex - restart actemra when medically feasible per Dr Nickola Major  - check LFT 12/16/2022(CMA to check if already stuck by lab for this)  - do spirometry and dlco in 3 months   Obstructive sleep apnea  Plan -Continue CPAP  Chrnonic persistnet sinusitis    - In control   Plan - continue regular flonase, and netti pot, and atrovent nasal spray snf cetrizine   Asthma/tracheobronchomalacia -Not in flareup.    Plan  - continue trelegy scheduled  - use alb as needed  VAccin counseling    Plan'  - recommend RSV vaccine but you have to get it commercially (not available in  our office for age >= 36)   GOals of care  Plan  - glad you have a living will  Followup - 3 month  face to face  -30-minute visit [name] after PFT; symptm score at folowup  ( Level 05 visit: Estb 40-54 min  visit type: on-site physical face to visit  in total care time and counseling or/and coordination of care by this undersigned MD - Dr Kalman Shan. This includes one or more of the following on this same day 12/16/2022: pre-charting, chart review, note writing, documentation  discussion of test results, diagnostic or treatment recommendations, prognosis, risks and benefits of management options, instructions, education, compliance or risk-factor reduction. It excludes time spent by the CMA or office staff in the care of the patient. Actual time 45 min)   SIGNATURE    Dr. Kalman Shan, M.D., F.C.C.P,  Pulmonary and Critical Care Medicine Staff Physician, [facility] Director - Interstitial Lung Disease  Program  Pulmonary Fibrosis [facility] at [facility] [place], [place], [address]  Pager: [phone], If no answer or between  15:00h - 7:00h: call [phone] Telephone: [phone]  4:44 PM 12/16/2022

## 2022-12-16 NOTE — Research (Signed)
Title: Chronic Fibrosing Interstitial Lung Disease with Progressive Phenotype Prospective Outcomes (ILD-PRO) Registry    Protocol #: IPF-PRO-SUB, Clinical Trials # [id number], Sponsor: [employer]   Protocol Version: Protocol Amendment 4 (Version Date: 29 March 2018)   IB: N/A   ICF:  [employer] Approved Version 21 Jun 2018 Revised 17Oct2023 (**version for ILD-PRO subjects, not for IPF-Pro subjects**)   IPF-Pro: Addendum Consent To Participate In A Research Registry; [employer] Approved Version 08 May 2018 Revised 08 May 2018 (*only for IPF-PRO subjects needing to have DNA redrawn*) Lab Manual: v2.0.0 dated 06Dec2021  Objectives:  Describe current approaches to diagnosis and treatment of chronic fibrosing ILDs with progressive phenotype  Describe the natural history of chronic fibrosing ILDs with progressive phenotype  Assess quality of life from self-administered participant reported questionnaires for each disease group  Describe participant interactions with the healthcare system, describe treatment practices across multiple institutions for each disease group  Collect biological samples linked to well characterized chronic fibrosing ILDs with progressive phenotype to identify disease biomarkers  Collect data and biological samples that will support future research studies.                                            Key Inclusion Criteria: Willing and able to provide informed consent  Age ? 30 years  Diagnosis of a non-IPF ILD of any duration, including, but not limited to Idiopathic Non-Specific Interstitial Pneumonia (INSIP), Unclassifiable Idiopathic Interstitial Pneumonias (IIPs), Interstitial Pneumonia with Autoimmune Features (IPAF), Autoimmune ILDs such as Rheumatoid Arthritis (RA-ILD) and Systemic Sclerosis (SSC-ILD), Chronic Hypersensitivity Pneumonitis (HP), Sarcoidosis or Exposure-related ILDs such as asbestosis.  Chronic fibrosing ILD defined by  reticular abnormality with traction bronchiectasis with or without honeycombing confirmed by chest HRCT scan and/or lung biopsy.  Progressive phenotype as defined by fulfilling at least one of the criteria below of fibrotic changes (progression set point) within the last 24 months regardless of treatment considered appropriate in individual ILDs:  decline in FVC % predicted (% pred) based on >10% relative decline  decline in FVC % pred based on ? 5 - <10% relative decline in FVC combined with worsening of respiratory symptoms as assessed by the site investigator  decline in FVC % pred based on ? 5 - <10% relative decline in FVC combined with increasing extent of fibrotic changes on chest imaging (HRCT scan) as assessed by the site investigator  decline in DLCO % pred based on ? 10% relative decline  worsening of respiratory symptoms as well as increasing extent of fibrotic changes on chest imaging (HRCT scan) as assessed by the site investigator independent of FVC change.     Key Exclusion Criteria: Malignancy, treated or untreated, other than skin or early stage prostate cancer, within the past 5 years  Currently listed for lung transplantation at the time of enrollment  Currently enrolled in a clinical trial at the time of enrollment in this registry       Clinical Research Coordinator / Research RN note : This visit for [name]  Subject [id number] with DOB:[birth year]  on 16 Dec 2022 for the above protocol is Visit/Encounter 7 and is for purpose of research.    Subject expressed continued interest and consent in continuing as a study subject. Subject confirmed that there was no change in contact information (e.g. address, telephone, email). Subject thanked for participation in  research and contribution to science.     During this visit on 16 Dec 2022 , the subject completed the blood work and questionnaires per the above referenced protocol. Please refer to the subject's paper source binder  for further details.   Signed by Neita Garnet Clinical Research Coordinator [facility]  [place], [place] 16 Dec 2022, 4:42 pm

## 2022-12-16 NOTE — Patient Instructions (Addendum)
ICD-10-CM   1. Chronic respiratory failure with hypoxia (HCC)  J96.11     2. ILD (interstitial lung disease) (HCC)  J84.9     3. Pulmonary hypertension (HCC)  I27.20     4. Obstructive sleep apnea  G47.33     5. Therapeutic drug monitoring  Z51.81     6. High risk medication use  Z79.899     7. Research subject  Z00.6     8. Goals of care, counseling/discussion  Z71.89     9. Vaccine counseling  Z71.85        -Progressive interstitial lung disease - clinically slightly progressive since May 2022 -> stable clnically on 7L [place] as of Jan 2024/May 2024 - Pulm hypertension since June 2023 and on tyvaso  - Left lowre extremity Ulcer since oct 2023 and slowly healing well as of 12/16/2022 with hold on actemra since fall 2023  - Plan ---Continue using oxygen 7 L at rest and  more  with exertion  -Monitor pulse ox with exertion and keep it over 86% -Continue participation in ILD-pro research registry -Continue pirfenidone   - if leg ulcers never heal we  might have to stop this for a month but at this point benefit of taking outweighs risk of stopping - continue tyvaso - Continue RA Rx with Dr. Zenovia Jordan: -  , leflunomade, prednisone, flonase, celebrex - restart actemra when medically feasible per Dr Nickola Major  - check LFT 12/16/2022(CMA to check if already stuck by lab for this)  - do spirometry and dlco in 3 months   Obstructive sleep apnea  Plan -Continue CPAP  Chrnonic persistnet sinusitis    - In control   Plan - continue regular flonase, and netti pot, and atrovent nasal spray snf cetrizine   Asthma/tracheobronchomalacia -Not in flareup.    Plan  - continue trelegy scheduled  - use alb as needed  VAccin counseling    Plan'  - recommend RSV vaccine but you have to get it commercially (not available in  our office for age >= 42)   GOals of care  Plan  - glad you have a living will  Followup - 3 month  face to face  -30-minute visit Dr.  Marchelle Gearing after PFT; symptm score at folowup

## 2022-12-22 ENCOUNTER — Encounter (HOSPITAL_BASED_OUTPATIENT_CLINIC_OR_DEPARTMENT_OTHER): Payer: Medicare Other | Attending: Internal Medicine | Admitting: Internal Medicine

## 2022-12-22 DIAGNOSIS — I5032 Chronic diastolic (congestive) heart failure: Secondary | ICD-10-CM | POA: Insufficient documentation

## 2022-12-22 DIAGNOSIS — Z9981 Dependence on supplemental oxygen: Secondary | ICD-10-CM | POA: Insufficient documentation

## 2022-12-22 DIAGNOSIS — Z833 Family history of diabetes mellitus: Secondary | ICD-10-CM | POA: Insufficient documentation

## 2022-12-22 DIAGNOSIS — I87312 Chronic venous hypertension (idiopathic) with ulcer of left lower extremity: Secondary | ICD-10-CM | POA: Insufficient documentation

## 2022-12-22 DIAGNOSIS — M069 Rheumatoid arthritis, unspecified: Secondary | ICD-10-CM | POA: Insufficient documentation

## 2022-12-22 DIAGNOSIS — I110 Hypertensive heart disease with heart failure: Secondary | ICD-10-CM | POA: Diagnosis not present

## 2022-12-22 DIAGNOSIS — J849 Interstitial pulmonary disease, unspecified: Secondary | ICD-10-CM | POA: Insufficient documentation

## 2022-12-22 DIAGNOSIS — L97829 Non-pressure chronic ulcer of other part of left lower leg with unspecified severity: Secondary | ICD-10-CM | POA: Insufficient documentation

## 2022-12-22 DIAGNOSIS — G4733 Obstructive sleep apnea (adult) (pediatric): Secondary | ICD-10-CM | POA: Insufficient documentation

## 2022-12-29 ENCOUNTER — Encounter (HOSPITAL_BASED_OUTPATIENT_CLINIC_OR_DEPARTMENT_OTHER): Payer: Medicare Other | Admitting: Internal Medicine

## 2022-12-29 DIAGNOSIS — L97828 Non-pressure chronic ulcer of other part of left lower leg with other specified severity: Secondary | ICD-10-CM

## 2022-12-29 DIAGNOSIS — I87312 Chronic venous hypertension (idiopathic) with ulcer of left lower extremity: Secondary | ICD-10-CM | POA: Diagnosis not present

## 2022-12-29 DIAGNOSIS — L97829 Non-pressure chronic ulcer of other part of left lower leg with unspecified severity: Secondary | ICD-10-CM | POA: Diagnosis not present

## 2022-12-31 NOTE — Progress Notes (Signed)
[name] ([id number]) [id number] Page 1 of 12 Visit Report for 12/29/2022 Chief Complaint Document Details Patient Name: Date of Service: Hailey Diamond, DO [name] 12/29/2022 12:45 PM Medical Record Number: [id number] Patient Account Number: [id number] Date of Birth/Sex: Treating RN: 18-Aug-1945 (77 y.o. F) Primary Care Provider: Burton [name] Other Clinician: Referring Provider: Treating Provider/Extender: [name] in Treatment: 29 Information Obtained from: Patient Chief Complaint 06/03/2022; Left lower extremity wounds Electronic Signature(s) Signed: 12/29/2022 1:43:31 PM By: Hailey Corwin DO Entered By: [name] on 12/29/2022 13:20:52 -------------------------------------------------------------------------------- Cellular or Tissue Based Product Details Patient Name: Date of Service: Hailey Diamond, DO [name] 12/29/2022 12:45 PM Medical Record Number: [id number] Patient Account Number: [id number] Date of Birth/Sex: Treating RN: 1946-03-03 (77 y.o. [name], [name] Primary Care Provider: Burton [name] Other Clinician: Referring Provider: Treating Provider/Extender: [name] in Treatment: 29 Cellular or Tissue Based Product Type Wound #5 Left,Distal,Anterior Lower Leg Applied to: Performed By: Physician Hailey Corwin, DO Cellular or Tissue Based Product Type: Apligraf Level of Consciousness (Pre-procedure): Awake and Alert Pre-procedure Verification/Time Out Yes - 13:05 Taken: Location: trunk / arms / legs Wound Size (sq cm): 0.15 Product Size (sq cm): 44 Waste Size (sq cm): 33 Waste Reason: wound size Amount of Product Applied (sq cm): 11 Instrument Used: Forceps, Scissors Lot #: [id number] Order #: 3 Expiration Date: 01/04/2023 Fenestrated: Yes Instrument: Blade Reconstituted: Yes Solution Type: normal saline Solution Amount: 10mL Lot #: [id number] Solution  Expiration Date: 12/15/2024 Secured: Yes Secured With: Steri-Strips, adaptic Dressing Applied: No Procedural Pain: 0 Post Procedural Pain: 0 Response to Treatment: Procedure was tolerated well [name], [name] ([id number]) [id number] Page 2 of 12 Level of Consciousness (Post- Awake and Alert procedure): Post Procedure Diagnosis Same as Pre-procedure Electronic Signature(s) Signed: 12/29/2022 1:43:31 PM By: Hailey Corwin DO Signed: 12/29/2022 5:31:32 PM By: Hailey Stall RN, BSN Entered By: [name] on 12/29/2022 13:11:34 -------------------------------------------------------------------------------- Debridement Details Patient Name: Date of Service: Hailey Diamond, DO [name] 12/29/2022 12:45 PM Medical Record Number: [id number] Patient Account Number: [id number] Date of Birth/Sex: Treating RN: 10/31/1945 (77 y.o. [name], [name] Primary Care Provider: Burton [name] Other Clinician: Referring Provider: Treating Provider/Extender: [name] in Treatment: 29 Debridement Performed for Assessment: Wound #5 Left,Distal,Anterior Lower Leg Performed By: Physician Hailey Corwin, DO Debridement Type: Debridement Level of Consciousness (Pre-procedure): Awake and Alert Pre-procedure Verification/Time Out Yes - 13:00 Taken: Start Time: 13:01 Pain Control: Lidocaine 4% T opical Solution Percent of Wound Bed Debrided: 100% T Area Debrided (cm): otal 0.12 Tissue and other material debrided: Viable, Non-Viable, Slough, Biofilm, Slough Level: Non-Viable Tissue Debridement Description: Selective/Open Wound Instrument: Curette Bleeding: Minimum Hemostasis Achieved: Pressure End Time: 13:04 Procedural Pain: 0 Post Procedural Pain: 0 Response to Treatment: Procedure was tolerated well Level of Consciousness (Post- Awake and Alert procedure): Post Debridement Measurements of Total Wound Length: (cm) 0.3 Width: (cm) 0.5 Depth:  (cm) 0.2 Volume: (cm) 0.024 Character of Wound/Ulcer Post Debridement: Improved Post Procedure Diagnosis Same as Pre-procedure Electronic Signature(s) Signed: 12/29/2022 1:43:31 PM By: Hailey Corwin DO Signed: 12/29/2022 5:31:32 PM By: Hailey Stall RN, BSN Entered By: [name] on 12/29/2022 13:09:36 [name] ([id number]) [id number] Page 3 of 12 -------------------------------------------------------------------------------- HPI Details Patient Name: Date of Service: Hailey Diamond, DO [name] 12/29/2022 12:45 PM Medical Record Number: [id number] Patient Account Number: [id number] Date of Birth/Sex: Treating RN: [birth year] (77 y.o. F) Primary Care Provider: Burton [name] Other  Clinician: Referring Provider: Treating Provider/Extender: [name] in Treatment: 29 History of Present Illness HPI Description: Patient presents today for initial evaluation and our clinic as a referral from the [facility] health system Department of orthopedics for evaluation and treatment of the wound to his his of the left foot at the base of the great toe. Currently the good news is the patient really does not have any significant pain which is excellent. She has been seen by Dr. Luciana Axe at [facility]: infectious disease clinic that is the regional Center for infectious disease. Subsequently the patient was cultured and positive for methicillin sensitive staph aureus. She has been on antibiotics including Cipro prior to surgery as will several days following surgery. She then had clindamycin which was changed to doxycycline. She took this for 10 days. Subsequently the pain had improved and there was no longer any possible draining from the wound according to the notes. The patient sedentary rate as well as C-reactive protein have returned to normal ranges. Currently per Dr. Ephriam Knuckles assessment there was one dehiscence with no sign of osteomyelitis.  He therefore discontinue the antibiotics with the completion of the last three days Of doxycycline. Other than that he just set her for a follow-up as needed. The patient does not have diabetes and is not a current smoker. At this time her treatment that was recommended by the surgeon was applying a Hydrocolloid dressing. Based on what I'm seeing at this point the patient actually has some Slough noted over the surface of the wound there really does not appear to be evidence of infection though I do think she does require some sharp debridement to clear away the slough and help with appropriate wound healing. She does have areas of granulation buds noted. She may be a candidate for a skin substitute as well. We will see how things do over the next period of time until follow-up. No fevers, chills, nausea, or vomiting noted at this time. 06/13/18 on evaluation today patient actually appears to be doing better in regard to her toe ulcer. She's been using the Prisma on this region and that seems to have done extremely well for her. Fortunately there does not appear to be evidence of infection at this time which is good news. She did see her surgeon they were extremely pleased with the overall appearance of her wound. 06/27/18 on evaluation today patient actually appears to be doing very well in regard to her foot ulcer. She has been tolerating the dressing changes without complication which is great news. She did see her podiatrist they felt like everything is looking very nice as well and will please. 07/05/2018 surgical wound on the left foot. She appears to be doing well. Still surface debrided to remove however in general post debridement the wound bed looks quite healthy it is come down significantly in terms of dimensions using silver collagen. The patient describes pain when her foot is elevated either in bed at night [sometimes keeps her awake] or when is propped up on a foot rest. She  basically takes analgesics for this. She does not really describe claudication with activity however her activity is very limited by interstitial lung disease. Her ABIs initially in this clinic were noncompressible. She is not a diabetic 07/20/2018; surgical wound on the left foot. Wound actually looks quite a bit better than I remember seeing this. She is still describing pain with her leg elevated at night that she does not get when the  wound is supine. She does not really describe claudication but she is very limited by her pulmonary status. We have been using silver collagen. 1/17; the patient has been followed up by orthopedics and discharge. Her arterial studies were actually quite good and should not be contributing to any pain. Slight reduction in ABIs but otherwise normal. Her wound is closed. She saw Dr. Luciana Axe of infectious disease surrounding the surgery. By review of our notes she was not felt to have osteomyelitis. 06/03/2022 Ms. Shaylyn Prinz is a 77 year old female with a past medical history of interstitial lung disease on chronic oxygen via nasal cannula, rheumatoid arthritis, chronic diastolic heart failure and OSA that presents to the clinic for a 1 to 2-month history of nonhealing ulcer to the left lower extremity. On 04/20/2022 she was admitted to the hospital for left lower extremity cellulitis. She required 8 days of IV vancomycin and Unasyn followed by a 14-day course of Augmentin and doxycycline. She has been using Silvadene cream to the area. She does not use compression therapy but does own compression stockings. She currently denies signs of infection. 11/27; patient presents for follow-up she is using Medihoney and Hydrofera Blue to the wound beds. She has been using her Tubigrip. She has no issues or complaints today. 12/4; patient presents for follow-up. She continues to use Medihoney and Hydrofera Blue to the wound beds. Several attempts have been made to schedule ABIs  with TBI's. We gave patient the number today to call to have these scheduled. She has been using Tubigrip. She has no issues or complaints today. 12/18; she continues to use Medihoney and Hydrofera Blue in general the surface of the wound is looking somewhat better this week and measurements are slightly smaller. She did have her arterial studies done these were noncompressible bilaterally however TBI on the right of 0.69 on the left 0.62. Waveforms on the right were triphasic on the left biphasic to triphasic. This does not suggest significant arterial disease to make healing wounds at this location difficult. 12/29; patient presents for follow-up. She has been using Medihoney and Hydrofera Blue to the wound bed. She has been using Tubigrip. 1/5; patient presents for follow-up. She is been using Medihoney and Hydrofera Blue to the wound bed along with Tubigrip. The Tubigrip was doubled at last clinic visit. There is been improvement in wound healing. 1/12; patient presents for follow-up. She has been using Medihoney and Hydrofera Blue to the wound bed along with Tubigrip. There continues to be improvement in wound healing. 1/19; patient presents for follow-up. She has been using Medihoney and Hydrofera Blue to the wound bed along with her juxta light compression daily. There is been improvement in wound measurements. Patient has no issues or complaints today. 1/25; patient presents for follow-up. She has been using blast X and collagen to the wound bed under juxta light compression daily. She has no issues or complaints today. There is been improvement in wound healing. 2/1; patient presents for follow-up. She has been using blast X and collagen to the wound bed under juxta light compression daily. She has no issues or complaints today. 2/9; patient presents for follow-up. She has been using Hydrofera Blue with blast X under compression wrap daily. She has been approved for a skin substitute and  patient would like to proceed with this. 2/15; patient presents for follow-up. PuraPly was placed in standard fashion to the large anterior leg wound. She has been using blast X and Hydrofera Blue to the more proximal  small wound. She has been using her juxta light compression daily. She has no issues or complaints today. 2/23; patient presents for follow-up. PuraPly was placed in standard fashion to the large anterior leg wound. The more proximal small wound is almost healed and they have been using blast X and Hydrofera Blue. She has been using her juxta light compression daily to the left lower extremity. There is been improvement in wound healing. [name], [name] ([id number]) [id number] Page 4 of 12 2/29; patient presents for follow-up. PuraPly was placed in standard fashion to the large anterior leg wound. The more proximal wound has healed. She is been using her juxta light compression daily. She has no issues or complaints today. 3/8; patient presents for follow-up. PuraPly has been used to the wound bed weekly patient has done well with this. She has been using her juxta light compression daily. She has no issues or complaints today. 3/15; patient presents for follow-up. We have been using PuraPly to the wound bed weekly. She has been using her juxta light compression daily. She has been doing well with this treatment. Her wound has improved. She has no issues or complaints today. 3/22; patient presents for follow-up. We have been using PuraPly to the wound bed weekly. She has been using her juxta lite compression. Overall more granulation tissue present today. Patient has no issues or complaints today. 3/29; patient presents for follow-up. We have been using PuraPly to the wound bed weekly. She has been using her juxta lite compression daily. The wound is smaller. 10/20/2022: The wound continues to contract. There is some slough accumulation in the deepest portion  of the wound. No concern for infection. 4/12; patient presents for follow-up. We have been placing PuraPly to the wound bed weekly. There has been improvement in wound healing. 4/18; patient presents for follow-up. We have been using PuraPly to the wound bed. There continues to be improvement in wound healing. She has no issues or complaints. 4/25; Patient presents for follow-up. We have been using PuraPly to the wound bed. The wound is smaller. 5/2; patient presents for follow-up. We have been using PuraPly. The wound is smaller. 5/9; patient presents for follow-up. We have been using PuraPly. Again wound is smaller. 5/16; patient presents for follow-up. We have been using PuraPly. Wound is stable. No signs of infection. She appears ready for Apligraf and this was discussed with the patient. We will proceed with this next week. 5/23; Patient presents for follow-up. Patient was to start Apligraf today however this was not delivered. We went ahead and replaced PuraPly. Overall wound appears well-healing. 5/30; patient presents for follow-up. We have been using PuraPly. Apligraf was available and this was placed in office today. Wound is smaller. 6/6; Apligraf at #2 applied today. Wound is improved surface area is better 6/13; patient presents for follow-up. Apligraf #3 was applied today. Wound is smaller. Electronic Signature(s) Signed: 12/29/2022 1:43:31 PM By: Hailey Corwin DO Entered By: [name] on 12/29/2022 13:21:22 -------------------------------------------------------------------------------- Physical Exam Details Patient Name: Date of Service: Hailey Diamond, DO [name] 12/29/2022 12:45 PM Medical Record Number: [id number] Patient Account Number: [id number] Date of Birth/Sex: Treating RN: 09-29-1945 (77 y.o. F) Primary Care Provider: Burton [name] Other Clinician: Referring Provider: Treating Provider/Extender: [name] in Treatment:  29 Constitutional respirations regular, non-labored and within target range for patient.. Cardiovascular 2+ dorsalis pedis/posterior tibialis pulses. Psychiatric pleasant and cooperative. Notes T the right lower extremity there is an open wound granulation  tissue and slough. No signs of surrounding infection. Good edema control. o Electronic Signature(s) Signed: 12/29/2022 1:43:31 PM By: Hailey Corwin DO Entered By: [name] on 12/29/2022 13:22:15 [name] ([id number]) [id number] Page 5 of 12 -------------------------------------------------------------------------------- Physician Orders Details Patient Name: Date of Service: [name] RO [place] S. 12/29/2022 12:45 PM Medical Record Number: [id number] Patient Account Number: [id number] Date of Birth/Sex: Treating RN: 06/05/1946 (77 y.o. [name], [name] Primary Care Provider: Burton [name] Other Clinician: Referring Provider: Treating Provider/Extender: [name] in Treatment: 29 Verbal / Phone Orders: No Diagnosis Coding ICD-10 Coding Code Description [phone] Non-pressure chronic ulcer of other part of left lower leg with other specified severity I87.312 Chronic venous hypertension (idiopathic) with ulcer of left lower extremity J84.9 Interstitial pulmonary disease, unspecified M06.9 Rheumatoid arthritis, unspecified Follow-up Appointments ppointment in 1 week. - Dr. Mikey Bussing room 8 Thursday 1230 01/05/2023 Return A ppointment in 2 weeks. - Dr. Mikey Bussing room 8 Thursday 1100 01/12/2023 Return A Anesthetic (In clinic) Topical Lidocaine 5% applied to wound bed Cellular or Tissue Based Products Wound #5 Left,Distal,Anterior Lower Leg Cellular or Tissue Based Product Type: - 08/18/2022 insurance ran organogenesis- 100% covered. 08/26/2022 #1 Puraply AM applied. 09/01/2022 #2 Puraply AM applied. 09/09/2022 #3 Puraply AM applied to both wounds. 09/15/2022 #4 Puraply  AM applied to wound. 09/23/2022 #5 Puraply AM applied to wound. 09/30/22 # 6 PUraply AM applied to wound. 10/07/22 #7 Puraply AM applied to wound. 10/14/2022 #8 Puraply AM applied to wound. 10/20/22 #9 Puraply AM applied to wound 10/28/2022 #11 Puraply AM applied to wound. 11/03/2022 #12 Puraply AM applied to wound. 11/10/2022 #13 Puraply AM applied to wound. 11/17/2022 #14 Puraply AM applied to wound bed. 11/24/2022 #15 puraply AM applied to wound bed. 12/01/2022 #16 Puraply AM applied to wound. Plan for apligraf for next week. 12/08/2022 #17 Puraply AM applied to wound. Apligraf not delivered, ordered for next week 12/15/2022 Apligraf #1 applied to wound bed. 12/22/2022 Apligraf #2 applied to wound bed. 12/29/2022 apligraf #3 applied to wound bed. Cellular or Tissue Based Product applied to wound bed, secured with steri-strips, cover with Adaptic or Mepitel. (DO NOT REMOVE). Bathing/ Shower/ Hygiene May shower with protection but do not get wound dressing(s) wet. Protect dressing(s) with water repellant cover (for example, large plastic bag) or a cast cover and may then take shower. Edema Control - Lymphedema / SCD / Other Elevate legs to the level of the heart or above for 30 minutes daily and/or when sitting for 3-4 times a day throughout the day. Avoid standing for long periods of time. Exercise regularly Compression stocking or Garment 20-30 mm/Hg pressure to: - start wearing the juxtalite HD apply in the morning and remove at night left leg. Non Wound Condition Protect area with: - buttock area with AandD ointment daily Wound Treatment Wound #5 - Lower Leg Wound Laterality: Left, Anterior, Distal Cleanser: Wound Cleanser (Generic) 1 x Per Week/30 Days Discharge Instructions: Cleanse the wound with wound cleanser prior to applying a clean dressing using gauze sponges, not tissue or cotton balls. Peri-Wound Care: Skin Prep (DME) (Generic) 1 x Per Week/30 Days Discharge Instructions: Use skin prep  as directed Prim Dressing: Apligraf ary 1 x Per Week/30 Days [name], [name] ([id number]) [id number] Page 6 of 12 Discharge Instructions: applied by provider. Prim Dressing: Adaptic and steri-strips 1 x Per Week/30 Days ary Discharge Instructions: Used to secure Puraply AM. Leave in place. Do not remove. Secondary Dressing: Zetuvit Plus Silicone Border Dressing  4x4 (in/in) (DME) (Generic) 1 x Per Week/30 Days Discharge Instructions: Apply silicone border over primary dressing as directed. Electronic Signature(s) Signed: 12/29/2022 1:43:31 PM By: Hailey Corwin DO Entered By: [name] on 12/29/2022 13:34:56 -------------------------------------------------------------------------------- Problem List Details Patient Name: Date of Service: Hailey Diamond, DO [name] 12/29/2022 12:45 PM Medical Record Number: [id number] Patient Account Number: [id number] Date of Birth/Sex: Treating RN: 1945/08/11 (77 y.o. [name], [name] Primary Care Provider: Burton [name] Other Clinician: Referring Provider: Treating Provider/Extender: [name] in Treatment: 29 Active Problems ICD-10 Encounter Code Description Active Date MDM Diagnosis L97.828 Non-pressure chronic ulcer of other part of left lower leg with other specified 06/03/2022 No Yes severity I87.312 Chronic venous hypertension (idiopathic) with ulcer of left lower extremity 06/03/2022 No Yes J84.9 Interstitial pulmonary disease, unspecified 06/03/2022 No Yes M06.9 Rheumatoid arthritis, unspecified 06/03/2022 No Yes Inactive Problems Resolved Problems Electronic Signature(s) Signed: 12/29/2022 1:43:31 PM By: Hailey Corwin DO Entered By: [name] on 12/29/2022 13:20:24 -------------------------------------------------------------------------------- Progress Note Details Patient Name: Date of Service: [name], DO [name] 12/29/2022 12:45 PM Medical Record Number:  [id number] Patient Account Number: [id number] [name], [name] ([id number]) [id number] Page 7 of 12 Date of Birth/Sex: Treating RN: 09-11-1945 (77 y.o. F) Primary Care Provider: Other Clinician: Burton [name] Referring Provider: Treating Provider/Extender: [name] in Treatment: 29 Subjective Chief Complaint Information obtained from Patient 06/03/2022; Left lower extremity wounds History of Present Illness (HPI) Patient presents today for initial evaluation and our clinic as a referral from the [facility] health system Department of orthopedics for evaluation and treatment of the wound to his his of the left foot at the base of the great toe. Currently the good news is the patient really does not have any significant pain which is excellent. She has been seen by Dr. Luciana Axe at [facility]: infectious disease clinic that is the regional Center for infectious disease. Subsequently the patient was cultured and positive for methicillin sensitive staph aureus. She has been on antibiotics including Cipro prior to surgery as will several days following surgery. She then had clindamycin which was changed to doxycycline. She took this for 10 days. Subsequently the pain had improved and there was no longer any possible draining from the wound according to the notes. The patient sedentary rate as well as C-reactive protein have returned to normal ranges. Currently per Dr. Ephriam Knuckles assessment there was one dehiscence with no sign of osteomyelitis. He therefore discontinue the antibiotics with the completion of the last three days Of doxycycline. Other than that he just set her for a follow-up as needed. The patient does not have diabetes and is not a current smoker. At this time her treatment that was recommended by the surgeon was applying a Hydrocolloid dressing. Based on what I'm seeing at this point the patient actually has some Slough noted over  the surface of the wound there really does not appear to be evidence of infection though I do think she does require some sharp debridement to clear away the slough and help with appropriate wound healing. She does have areas of granulation buds noted. She may be a candidate for a skin substitute as well. We will see how things do over the next period of time until follow-up. No fevers, chills, nausea, or vomiting noted at this time. 06/13/18 on evaluation today patient actually appears to be doing better in regard to her toe ulcer. She's been using the Prisma on this region and  that seems to have done extremely well for her. Fortunately there does not appear to be evidence of infection at this time which is good news. She did see her surgeon they were extremely pleased with the overall appearance of her wound. 06/27/18 on evaluation today patient actually appears to be doing very well in regard to her foot ulcer. She has been tolerating the dressing changes without complication which is great news. She did see her podiatrist they felt like everything is looking very nice as well and will please. 07/05/2018 surgical wound on the left foot. She appears to be doing well. Still surface debrided to remove however in general post debridement the wound bed looks quite healthy it is come down significantly in terms of dimensions using silver collagen. The patient describes pain when her foot is elevated either in bed at night [sometimes keeps her awake] or when is propped up on a foot rest. She basically takes analgesics for this. She does not really describe claudication with activity however her activity is very limited by interstitial lung disease. Her ABIs initially in this clinic were noncompressible. She is not a diabetic 07/20/2018; surgical wound on the left foot. Wound actually looks quite a bit better than I remember seeing this. She is still describing pain with her leg elevated at night that she  does not get when the wound is supine. She does not really describe claudication but she is very limited by her pulmonary status. We have been using silver collagen. 1/17; the patient has been followed up by orthopedics and discharge. Her arterial studies were actually quite good and should not be contributing to any pain. Slight reduction in ABIs but otherwise normal. Her wound is closed. She saw Dr. Luciana Axe of infectious disease surrounding the surgery. By review of our notes she was not felt to have osteomyelitis. 06/03/2022 Ms. Niaya Krimmel is a 77 year old female with a past medical history of interstitial lung disease on chronic oxygen via nasal cannula, rheumatoid arthritis, chronic diastolic heart failure and OSA that presents to the clinic for a 1 to 62-month history of nonhealing ulcer to the left lower extremity. On 04/20/2022 she was admitted to the hospital for left lower extremity cellulitis. She required 8 days of IV vancomycin and Unasyn followed by a 14-day course of Augmentin and doxycycline. She has been using Silvadene cream to the area. She does not use compression therapy but does own compression stockings. She currently denies signs of infection. 11/27; patient presents for follow-up she is using Medihoney and Hydrofera Blue to the wound beds. She has been using her Tubigrip. She has no issues or complaints today. 12/4; patient presents for follow-up. She continues to use Medihoney and Hydrofera Blue to the wound beds. Several attempts have been made to schedule ABIs with TBI's. We gave patient the number today to call to have these scheduled. She has been using Tubigrip. She has no issues or complaints today. 12/18; she continues to use Medihoney and Hydrofera Blue in general the surface of the wound is looking somewhat better this week and measurements are slightly smaller. She did have her arterial studies done these were noncompressible bilaterally however TBI on the right of  0.69 on the left 0.62. Waveforms on the right were triphasic on the left biphasic to triphasic. This does not suggest significant arterial disease to make healing wounds at this location difficult. 12/29; patient presents for follow-up. She has been using Medihoney and Hydrofera Blue to the wound bed. She has  been using Tubigrip. 1/5; patient presents for follow-up. She is been using Medihoney and Hydrofera Blue to the wound bed along with Tubigrip. The Tubigrip was doubled at last clinic visit. There is been improvement in wound healing. 1/12; patient presents for follow-up. She has been using Medihoney and Hydrofera Blue to the wound bed along with Tubigrip. There continues to be improvement in wound healing. 1/19; patient presents for follow-up. She has been using Medihoney and Hydrofera Blue to the wound bed along with her juxta light compression daily. There is been improvement in wound measurements. Patient has no issues or complaints today. 1/25; patient presents for follow-up. She has been using blast X and collagen to the wound bed under juxta light compression daily. She has no issues or complaints today. There is been improvement in wound healing. 2/1; patient presents for follow-up. She has been using blast X and collagen to the wound bed under juxta light compression daily. She has no issues or complaints today. 2/9; patient presents for follow-up. She has been using Hydrofera Blue with blast X under compression wrap daily. She has been approved for a skin substitute and patient would like to proceed with this. 2/15; patient presents for follow-up. PuraPly was placed in standard fashion to the large anterior leg wound. She has been using blast X and Hydrofera Blue to the more proximal small wound. She has been using her juxta light compression daily. She has no issues or complaints today. 2/23; patient presents for follow-up. PuraPly was placed in standard fashion to the large anterior  leg wound. The more proximal small wound is almost healed and they have been using blast X and Hydrofera Blue. She has been using her juxta light compression daily to the left lower extremity. There is been improvement in wound healing. [name], [name] ([id number]) [id number] Page 8 of 12 2/29; patient presents for follow-up. PuraPly was placed in standard fashion to the large anterior leg wound. The more proximal wound has healed. She is been using her juxta light compression daily. She has no issues or complaints today. 3/8; patient presents for follow-up. PuraPly has been used to the wound bed weekly patient has done well with this. She has been using her juxta light compression daily. She has no issues or complaints today. 3/15; patient presents for follow-up. We have been using PuraPly to the wound bed weekly. She has been using her juxta light compression daily. She has been doing well with this treatment. Her wound has improved. She has no issues or complaints today. 3/22; patient presents for follow-up. We have been using PuraPly to the wound bed weekly. She has been using her juxta lite compression. Overall more granulation tissue present today. Patient has no issues or complaints today. 3/29; patient presents for follow-up. We have been using PuraPly to the wound bed weekly. She has been using her juxta lite compression daily. The wound is smaller. 10/20/2022: The wound continues to contract. There is some slough accumulation in the deepest portion of the wound. No concern for infection. 4/12; patient presents for follow-up. We have been placing PuraPly to the wound bed weekly. There has been improvement in wound healing. 4/18; patient presents for follow-up. We have been using PuraPly to the wound bed. There continues to be improvement in wound healing. She has no issues or complaints. 4/25; Patient presents for follow-up. We have been using PuraPly to the wound  bed. The wound is smaller. 5/2; patient presents for follow-up. We have been using PuraPly.  The wound is smaller. 5/9; patient presents for follow-up. We have been using PuraPly. Again wound is smaller. 5/16; patient presents for follow-up. We have been using PuraPly. Wound is stable. No signs of infection. She appears ready for Apligraf and this was discussed with the patient. We will proceed with this next week. 5/23; Patient presents for follow-up. Patient was to start Apligraf today however this was not delivered. We went ahead and replaced PuraPly. Overall wound appears well-healing. 5/30; patient presents for follow-up. We have been using PuraPly. Apligraf was available and this was placed in office today. Wound is smaller. 6/6; Apligraf at #2 applied today. Wound is improved surface area is better 6/13; patient presents for follow-up. Apligraf #3 was applied today. Wound is smaller. Patient History Information obtained from Patient. Family History Cancer - Maternal Grandparents, Diabetes - Siblings, Heart Disease - Maternal Grandparents,Paternal Grandparents,Mother,Father,Siblings, Hypertension - Mother,Father,Siblings, Stroke - Paternal Grandparents, No family history of Hereditary Spherocytosis, Kidney Disease, Lung Disease, Seizures, Thyroid Problems, Tuberculosis. Social History Never smoker, Marital Status - Married, Alcohol Use - Moderate, Drug Use - No History, Caffeine Use - Moderate. Medical History Eyes Denies history of Cataracts, Optic Neuritis Ear/Nose/Mouth/Throat Denies history of Chronic sinus problems/congestion Hematologic/Lymphatic Denies history of Anemia, Hemophilia, Human Immunodeficiency Virus, Lymphedema, Sickle Cell Disease Respiratory Patient has history of Asthma, Sleep Apnea Denies history of Aspiration, Chronic Obstructive Pulmonary Disease (COPD), Pneumothorax, Tuberculosis Cardiovascular Patient has history of Hypertension Denies history of Angina,  Arrhythmia, Congestive Heart Failure, Coronary Artery Disease, Deep Vein Thrombosis, Hypotension, Myocardial Infarction, Peripheral Arterial Disease, Peripheral Venous Disease, Phlebitis, Vasculitis Gastrointestinal Patient has history of Colitis Denies history of Cirrhosis , Crohns, Hepatitis A, Hepatitis B, Hepatitis C Endocrine Denies history of Type I Diabetes, Type II Diabetes Immunological Denies history of Lupus Erythematosus, Raynauds, Scleroderma Integumentary (Skin) Denies history of History of Burn Musculoskeletal Patient has history of Rheumatoid Arthritis, Osteoarthritis Denies history of Gout, Osteomyelitis Neurologic Denies history of Dementia, Neuropathy, Quadriplegia, Paraplegia, Seizure Disorder Oncologic Denies history of Received Chemotherapy, Received Radiation Psychiatric Denies history of Anorexia/bulimia, Confinement Anxiety Hospitalization/Surgery History - surgery toe. - right hip replacement 12/2018. - heart cath 12/17/2021. Medical A Surgical History Notes nd Respiratory 4L [place] 02 pulmonary fibrosis [name], [name] ([id number]) [id number] Page 9 of 12 Objective Constitutional respirations regular, non-labored and within target range for patient.. Vitals Time Taken: 12:56 PM, Height: 61 in, Weight: 186 lbs, BMI: 35.1, Temperature: 98.5 F, Pulse: 90 bpm, Respiratory Rate: 24 breaths/min, Blood Pressure: 124/77 mmHg, Pulse Oximetry: 88 %. General Notes: patient SHOB with 6L 02 [place] sats before pursed lip breathing were 80% increased to 88%. Patient took her rescue inhaler. Per patient been nausea since last week not eating well. Per patient seen PCP placed on mecalzine. Physician made aware of SHOB and nausea. Cardiovascular 2+ dorsalis pedis/posterior tibialis pulses. Psychiatric pleasant and cooperative. General Notes: T the right lower extremity there is an open wound granulation tissue and slough. No signs of surrounding  infection. Good edema control. o Integumentary (Hair, Skin) Wound #5 status is Open. Original cause of wound was Gradually Appeared. The date acquired was: 04/19/2022. The wound has been in treatment 29 weeks. The wound is located on the [name] Lower Leg. The wound measures 0.3cm length x 0.5cm width x 0.2cm depth; 0.118cm^2 area and 0.024cm^3 volume. There is Fat Layer (Subcutaneous Tissue) exposed. There is no tunneling or undermining noted. There is a medium amount of serosanguineous drainage noted. The wound margin is distinct with the outline attached to  the wound base. There is large (67-100%) pink, pale, hyper - granulation within the wound bed. There is a small (1-33%) amount of necrotic tissue within the wound bed. The periwound skin appearance did not exhibit: Callus, Crepitus, Excoriation, Induration, Rash, Scarring, Dry/Scaly, Maceration, Atrophie Blanche, Cyanosis, Ecchymosis, Hemosiderin Staining, Mottled, Pallor, Rubor, Erythema. Periwound temperature was noted as No Abnormality. 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 I debrided nonviable tissue. Apligraf was placed in standard fashion. Continue Velcro compression garment daily. Follow-up in 1 week. Procedures Wound #5 Pre-procedure diagnosis of Wound #5 is a Cellulitis located on the Left,Distal,Anterior Lower Leg . There was a Selective/Open Wound Non-Viable Tissue Debridement with a total area of 0.12 sq cm performed by Hailey Corwin, DO. With the following instrument(s): Curette to remove Viable and Non-Viable tissue/material. Material removed includes Slough and Biofilm and after achieving  pain control using Lidocaine 4% T opical Solution. A time out was conducted at 13:00, prior to the start of the procedure. A Minimum amount of bleeding was controlled with Pressure. The procedure was tolerated well with a pain level of 0 throughout and a pain level of 0 following the procedure. Post Debridement Measurements: 0.3cm length x 0.5cm width x 0.2cm depth; 0.024cm^3 volume. Character of Wound/Ulcer Post Debridement is improved. Post procedure Diagnosis Wound #5: Same as Pre-Procedure Pre-procedure diagnosis of Wound #5 is a Cellulitis located on the Left,Distal,Anterior Lower Leg. A skin graft procedure using a bioengineered skin substitute/cellular or tissue based product was performed by Hailey Corwin, DO with the following instrument(s): Forceps and Scissors. Apligraf was applied and secured with Steri-Strips and adaptic. 11 sq cm of product was utilized and 33 sq cm was wasted due to wound size. Post Application, no dressing was applied. A Time Out was conducted at 13:05, prior to the start of the procedure. The procedure was tolerated well with a pain level of 0 throughout and a pain level of 0 following the procedure. Post procedure Diagnosis Wound #5: Same as Pre-Procedure . Plan Follow-up Appointments: [name], [name] ([id number]) [id number] Page 10 of 12 Return Appointment in 1 week. - Dr. Mikey Bussing room 8 Thursday 1230 01/05/2023 Return Appointment in 2 weeks. - Dr. Mikey Bussing room 8 Thursday 1100 01/12/2023 Anesthetic: (In clinic) Topical Lidocaine 5% applied to wound bed Cellular or Tissue Based Products: Wound #5 Left,Distal,Anterior Lower Leg: Cellular or Tissue Based Product Type: - 08/18/2022 insurance ran organogenesis- 100% covered. 08/26/2022 #1 Puraply AM applied. 09/01/2022 #2 Puraply AM applied. 09/09/2022 #3 Puraply AM applied to both wounds. 09/15/2022 #4 Puraply AM applied to wound. 09/23/2022 #5 Puraply AM applied to wound. 09/30/22 #  6 PUraply AM applied to wound. 10/07/22 #7 Puraply AM applied to wound. 10/14/2022 #8 Puraply AM applied to wound. 10/20/22 #9 Puraply AM applied to wound 10/28/2022 #11 Puraply AM applied to wound. 11/03/2022 #12 Puraply AM applied to wound. 11/10/2022 #13 Puraply AM applied to wound. 11/17/2022 #14 Puraply AM applied to wound bed. 11/24/2022 #15 puraply AM applied to wound bed. 12/01/2022 #16 Puraply AM applied to wound. Plan for apligraf for next week. 12/08/2022 #17 Puraply AM applied to wound. Apligraf not delivered, ordered for next week 12/15/2022 Apligraf #1 applied to wound bed. 12/22/2022 Apligraf #2 applied to wound bed. 12/29/2022 apligraf #3 applied to  wound bed. Cellular or Tissue Based Product applied to wound bed, secured with steri-strips, cover with Adaptic or Mepitel. (DO NOT REMOVE). Bathing/ Shower/ Hygiene: May shower with protection but do not get wound dressing(s) wet. Protect dressing(s) with water repellant cover (for example, large plastic bag) or a cast cover and may then take shower. Edema Control - Lymphedema / SCD / Other: Elevate legs to the level of the heart or above for 30 minutes daily and/or when sitting for 3-4 times a day throughout the day. Avoid standing for long periods of time. Exercise regularly Compression stocking or Garment 20-30 mm/Hg pressure to: - start wearing the juxtalite HD apply in the morning and remove at night left leg. Non Wound Condition: Protect area with: - buttock area with AandD ointment daily WOUND #5: - Lower Leg Wound Laterality: Left, Anterior, Distal Cleanser: Wound Cleanser (Generic) 1 x Per Week/30 Days Discharge Instructions: Cleanse the wound with wound cleanser prior to applying a clean dressing using gauze sponges, not tissue or cotton balls. Peri-Wound Care: Skin Prep (DME) (Generic) 1 x Per Week/30 Days Discharge Instructions: Use skin prep as directed Prim Dressing: Apligraf 1 x Per Week/30 Days ary Discharge Instructions: applied  by provider. Prim Dressing: Adaptic and steri-strips 1 x Per Week/30 Days ary Discharge Instructions: Used to secure Puraply AM. Leave in place. Do not remove. Secondary Dressing: Zetuvit Plus Silicone Border Dressing 4x4 (in/in) (DME) (Generic) 1 x Per Week/30 Days Discharge Instructions: Apply silicone border over primary dressing as directed. 1. In office sharp debridement 2. Apligraf placed in standard fashion 3. Continue compression Velcro garment daily 4. Follow-up in 1 week Electronic Signature(s) Signed: 12/29/2022 1:43:31 PM By: Hailey Corwin DO Entered By: [name] on 12/29/2022 13:36:05 -------------------------------------------------------------------------------- HxROS Details Patient Name: Date of Service: Hailey Diamond, DO [name] 12/29/2022 12:45 PM Medical Record Number: [id number] Patient Account Number: [id number] Date of Birth/Sex: Treating RN: 1945/12/16 (76 y.o. F) Primary Care Provider: Burton [name] Other Clinician: Referring Provider: Treating Provider/Extender: [name] in Treatment: 29 Information Obtained From Patient Eyes Medical History: Negative for: Cataracts; Optic Neuritis Ear/Nose/Mouth/Throat Medical History: Negative for: Chronic sinus problems/congestion Hematologic/Lymphatic Medical History: Negative for: Anemia; Hemophilia; Human Immunodeficiency Virus; Lymphedema; Sickle Cell Disease CEDRINA, OETTINGER ([id number]) [id number] Page 11 of 12 Respiratory Medical History: Positive for: Asthma; Sleep Apnea Negative for: Aspiration; Chronic Obstructive Pulmonary Disease (COPD); Pneumothorax; Tuberculosis Past Medical History Notes: 4L [place] 02 pulmonary fibrosis Cardiovascular Medical History: Positive for: Hypertension Negative for: Angina; Arrhythmia; Congestive Heart Failure; Coronary Artery Disease; Deep Vein Thrombosis; Hypotension; Myocardial Infarction;  Peripheral Arterial Disease; Peripheral Venous Disease; Phlebitis; Vasculitis Gastrointestinal Medical History: Positive for: Colitis Negative for: Cirrhosis ; Crohns; Hepatitis A; Hepatitis B; Hepatitis C Endocrine Medical History: Negative for: Type I Diabetes; Type II Diabetes Immunological Medical History: Negative for: Lupus Erythematosus; Raynauds; Scleroderma Integumentary (Skin) Medical History: Negative for: History of Burn Musculoskeletal Medical History: Positive for: Rheumatoid Arthritis; Osteoarthritis Negative for: Gout; Osteomyelitis Neurologic Medical History: Negative for: Dementia; Neuropathy; Quadriplegia; Paraplegia; Seizure Disorder Oncologic Medical History: Negative for: Received Chemotherapy; Received Radiation Psychiatric Medical History: Negative for: Anorexia/bulimia; Confinement Anxiety Immunizations Pneumococcal Vaccine: Received Pneumococcal Vaccination: Yes Received Pneumococcal Vaccination On or After 60th Birthday: Yes Implantable Devices No devices added Hospitalization / Surgery History Type of Hospitalization/Surgery surgery toe right hip replacement 12/2018 heart cath 12/17/2021 Family and Social History Cancer: Yes - Maternal Grandparents; Diabetes: Yes - Siblings; Heart Disease: Yes - Maternal Grandparents,Paternal Grandparents,Mother,Father,Siblings; Hereditary Spherocytosis: No; Hypertension:  Yes - Mother,Father,Siblings; Kidney Disease: No; Lung Disease: No; Seizures: No; Stroke: Yes - Paternal Grandparents; Thyroid Problems: No; Tuberculosis: No; Never smoker; Marital Status - Married; Alcohol Use: Moderate; Drug Use: No History; Caffeine Use: Moderate; Financial Concerns: No; Food, Clothing or Shelter Needs: No; Support System Lacking: No; Transportation Concerns: No [name], [name] ([id number]) [id number] Page 12 of 12 Electronic Signature(s) Signed: 12/29/2022 1:43:31 PM By: Hailey Corwin  DO Entered By: [name] on 12/29/2022 13:21:28 -------------------------------------------------------------------------------- SuperBill Details Patient Name: Date of Service: [name], DO [name] 12/29/2022 Medical Record Number: [id number] Patient Account Number: [id number] Date of Birth/Sex: Treating RN: 1946-02-02 (77 y.o. [name], [name] Primary Care Provider: Burton [name] Other Clinician: Referring Provider: Treating Provider/Extender: [name] in Treatment: 29 Diagnosis Coding ICD-10 Codes Code Description [phone] Non-pressure chronic ulcer of other part of left lower leg with other specified severity I87.312 Chronic venous hypertension (idiopathic) with ulcer of left lower extremity J84.9 Interstitial pulmonary disease, unspecified M06.9 Rheumatoid arthritis, unspecified Facility Procedures : CPT4 Code: 21308657 Description: (Facility Use Only) Apligraf 1 SQ CM Modifier: Quantity: 44 : CPT4 Code: 84696295 Description: 15271 - SKIN SUB GRAFT TRNK/ARM/LEG ICD-10 Diagnosis Description L97.828 Non-pressure chronic ulcer of other part of left lower leg with other specified s I87.312 Chronic venous hypertension (idiopathic) with ulcer of left lower extremity Modifier: everity Quantity: 1 Physician Procedures : CPT4 Code Description Modifier [id number] - WC PHYS SKIN SUB GRAFT TRNK/ARM/LEG ICD-10 Diagnosis Description L97.828 Non-pressure chronic ulcer of other part of left lower leg with other specified severity I87.312 Chronic venous hypertension  (idiopathic) with ulcer of left lower extremity Quantity: 1 Electronic Signature(s) Signed: 12/29/2022 1:43:31 PM By: Hailey Corwin DO Entered By: [name] on 12/29/2022 13:36:18

## 2022-12-31 NOTE — Progress Notes (Signed)
TAUREAN, GAD ([id number]) [id number] Page 1 of 8 Visit Report for 12/29/2022 Arrival Information Details Patient Name: Date of Service: Hailey Diamond, DO [name] 12/29/2022 12:45 PM Medical Record Number: [id number] Patient Account Number: [id number] Date of Birth/Sex: Treating RN: 1946/02/08 (77 y.o. [name], [name] Primary Care [name]: Burton Apley Other Clinician: Referring Yazleemar Strassner: Treating [name]/Extender: Cherre Blanc in Treatment: 29 Visit Information History Since Last Visit Added or deleted any medications: No Patient Arrived: Wheel Chair Any new allergies or adverse reactions: No Arrival Time: 12:47 Had a fall or experienced change in No Accompanied By: husband activities of daily living that may affect Transfer Assistance: Manual risk of falls: Patient Identification Verified: Yes Signs or symptoms of abuse/neglect since last visito No Secondary Verification Process Completed: Yes Hospitalized since last visit: No Patient Requires Transmission-Based Precautions: No Implantable device outside of the clinic excluding No Patient Has Alerts: Yes cellular tissue based products placed in the center Patient Alerts: ABI's: L: N/C 11/23 since last visit: Has Dressing in Place as Prescribed: Yes Has Compression in Place as Prescribed: Yes Pain Present Now: No Electronic Signature(s) Signed: 12/29/2022 5:31:32 PM By: Shawn Stall RN, BSN Entered By: Shawn Stall on 12/29/2022 12:48:04 -------------------------------------------------------------------------------- Encounter Discharge Information Details Patient Name: Date of Service: Hailey Diamond, DO [name] 12/29/2022 12:45 PM Medical Record Number: [id number] Patient Account Number: [id number] Date of Birth/Sex: Treating RN: Nov 25, 1945 (77 y.o. [name] Primary Care [name]: Burton Apley Other Clinician: Referring Francina Beery: Treating Kao Berkheimer/Extender:  Cherre Blanc in Treatment: 29 Encounter Discharge Information Items Post Procedure Vitals Discharge Condition: Stable Temperature (F): 98.5 Ambulatory Status: Wheelchair Pulse (bpm): 90 Discharge Destination: Home Respiratory Rate (breaths/min): 22 Transportation: Private Auto Blood Pressure (mmHg): 124/77 Accompanied By: husband Schedule Follow-up Appointment: Yes Clinical Summary of Care: Notes patient decreased in rapid breathing, more deep breaths now. patient will continue to [facility] at home. Electronic Signature(s) Signed: 12/29/2022 5:31:32 PM By: Shawn Stall RN, BSN Entered By: Shawn Stall on 12/29/2022 13:14:33 [name] ([id number]) [id number] Page 2 of 8 -------------------------------------------------------------------------------- Lower Extremity Assessment Details Patient Name: Date of Service: [name] RO [place] S. 12/29/2022 12:45 PM Medical Record Number: [id number] Patient Account Number: [id number] Date of Birth/Sex: Treating RN: 20-Mar-1946 (77 y.o. [name], [name] Primary Care Ladeja Pelham: Burton Apley Other Clinician: Referring Arlicia Paquette: Treating [name]/Extender: Cherre Blanc in Treatment: 29 Edema Assessment Assessed: [name]: Yes] Franne Forts: No] Edema: [Left: N] [Right: o] Calf Left: Right: Point of Measurement: 29 cm From Medial Instep 30 cm Ankle Left: Right: Point of Measurement: 9 cm From Medial Instep 24 cm Vascular Assessment Pulses: Dorsalis Pedis Palpable: [Left:Yes] Electronic Signature(s) Signed: 12/29/2022 5:31:32 PM By: Shawn Stall RN, BSN Entered By: Shawn Stall on 12/29/2022 12:48:26 -------------------------------------------------------------------------------- Multi Wound Chart Details Patient Name: Date of Service: Hailey Diamond, DO [name] 12/29/2022 12:45 PM Medical Record Number: [id number] Patient Account Number: [id number] Date of  Birth/Sex: Treating RN: 1946-01-24 (77 y.o. F) Primary Care [name]: Burton Apley Other Clinician: Referring Earlena Werst: Treating Monicka Cyran/Extender: Cherre Blanc in Treatment: 29 Vital Signs Height(in): 61 Pulse(bpm): 90 Weight(lbs): 186 Blood Pressure(mmHg): 124/77 Body Mass Index(BMI): 35.1 Temperature(F): 98.5 Respiratory Rate(breaths/min): 24 [5:Photos:] [N/A:N/A] Left, Distal, Anterior Lower Leg N/A N/A Wound Location: Gradually Appeared N/A N/A Wounding Event: Cellulitis N/A N/A Primary Etiology: Asthma, Sleep Apnea, Hypertension, N/A N/A Comorbid History: Colitis, Rheumatoid Arthritis, Osteoarthritis 04/19/2022 N/A N/A Date Acquired: 29 N/A N/A  Weeks of Treatment: Open N/A N/A Wound Status: No N/A N/A Wound Recurrence: 0.3x0.5x0.2 N/A N/A Measurements L x W x D (cm) 0.118 N/A N/A A (cm) : rea 0.024 N/A N/A Volume (cm) : 99.20% N/A N/A % Reduction in A rea: 98.50% N/A N/A % Reduction in Volume: Full Thickness With Exposed Support N/A N/A Classification: Structures Medium N/A N/A Exudate A mount: Serosanguineous N/A N/A Exudate Type: red, brown N/A N/A Exudate Color: Distinct, outline attached N/A N/A Wound Margin: Large (67-100%) N/A N/A Granulation A mount: Pink, Pale, Hyper-granulation N/A N/A Granulation Quality: Small (1-33%) N/A N/A Necrotic A mount: Fat Layer (Subcutaneous Tissue): Yes N/A N/A Exposed Structures: Fascia: No Tendon: No Muscle: No Joint: No Bone: No Large (67-100%) N/A N/A Epithelialization: Debridement - Selective/Open Wound N/A N/A Debridement: Pre-procedure Verification/Time Out 13:00 N/A N/A Taken: Lidocaine 4% Topical Solution N/A N/A Pain Control: Slough N/A N/A Tissue Debrided: Non-Viable Tissue N/A N/A Level: 0.12 N/A N/A Debridement A (sq cm): rea Curette N/A N/A Instrument: Minimum N/A N/A Bleeding: Pressure N/A N/A Hemostasis A chieved: 0 N/A N/A Procedural  Pain: 0 N/A N/A Post Procedural Pain: Procedure was tolerated well N/A N/A Debridement Treatment Response: 0.3x0.5x0.2 N/A N/A Post Debridement Measurements L x W x D (cm) 0.024 N/A N/A Post Debridement Volume: (cm) Excoriation: No N/A N/A Periwound Skin Texture: Induration: No Callus: No Crepitus: No Rash: No Scarring: No Maceration: No N/A N/A Periwound Skin Moisture: Dry/Scaly: No Atrophie Blanche: No N/A N/A Periwound Skin Color: Cyanosis: No Ecchymosis: No Erythema: No Hemosiderin Staining: No Mottled: No Pallor: No Rubor: No No Abnormality N/A N/A Temperature: Cellular or Tissue Based Product N/A N/A Procedures Performed: Debridement Treatment Notes Wound #5 (Lower Leg) Wound Laterality: Left, Anterior, Distal Cleanser Wound Cleanser Discharge Instruction: Cleanse the wound with wound cleanser prior to applying a clean dressing using gauze sponges, not tissue or cotton balls. Peri-Wound Care Skin Prep Discharge Instruction: Use skin prep as directed Topical Primary Dressing Apligraf Discharge Instruction: applied by Sherrilyn Nairn. ASHIA, MUSICO ([id number]) [id number] Page 4 of 8 Adaptic and steri-strips Discharge Instruction: Used to secure Puraply AM. Leave in place. Do not remove. Secondary Dressing Zetuvit Plus Silicone Border Dressing 4x4 (in/in) Discharge Instruction: Apply silicone border over primary dressing as directed. Secured With Compression Wrap Compression Stockings Facilities manager) Signed: 12/29/2022 1:43:31 PM By: Geralyn Corwin DO Entered By: Geralyn Corwin on 12/29/2022 13:20:32 -------------------------------------------------------------------------------- Multi-Disciplinary Care Plan Details Patient Name: Date of Service: Hailey Diamond, DO [name] 12/29/2022 12:45 PM Medical Record Number: [id number] Patient Account Number: [id number] Date of Birth/Sex: Treating RN: 07/30/1945 (76 y.o. [name], [name] Primary Care Tyeson Tanimoto: Burton Apley Other Clinician: Referring Brinlynn Gorton: Treating Rikki Trosper/Extender: Cherre Blanc in Treatment: 29 Active Inactive Pain, Acute or Chronic Nursing Diagnoses: Pain, acute or chronic: actual or potential Potential alteration in comfort, pain Goals: Patient will verbalize adequate pain control and receive pain control interventions during procedures as needed Date Initiated: 06/03/2022 Target Resolution Date: 01/13/2023 Goal Status: Active Patient/caregiver will verbalize comfort level met Date Initiated: 06/03/2022 Target Resolution Date: 01/13/2023 Goal Status: Active Interventions: Encourage patient to take pain medications as prescribed Provide education on pain management Treatment Activities: Administer pain control measures as ordered : 06/03/2022 Notes: Electronic Signature(s) Signed: 12/29/2022 5:31:32 PM By: Shawn Stall RN, BSN Entered By: Shawn Stall on 12/29/2022 12:59:58 [name] ([id number]) [id number] Page 5 of 8 -------------------------------------------------------------------------------- Pain Assessment Details Patient Name: Date of Service: Hailey O K, DO [name]  12/29/2022 12:45 PM Medical Record Number: [id number] Patient Account Number: [id number] Date of Birth/Sex: Treating RN: 02-13-1946 (77 y.o. [name] Primary Care Stepheny Canal: Burton Apley Other Clinician: Referring Dennise Bamber: Treating Jeneane Pieczynski/Extender: Cherre Blanc in Treatment: 29 Active Problems Location of Pain Severity and Description of Pain Patient Has Paino No Site Locations Pain Management and Medication Current Pain Management: Electronic Signature(s) Signed: 12/29/2022 5:31:32 PM By: Shawn Stall RN, BSN Entered By: Shawn Stall on 12/29/2022  12:48:16 -------------------------------------------------------------------------------- Patient/Caregiver Education Details Patient Name: Date of Service: [name] 6/13/2024andnbsp12:45 PM Medical Record Number: [id number] Patient Account Number: [id number] Date of Birth/Gender: Treating RN: February 13, 1946 (77 y.o. [name], [name] Primary Care Physician: Burton Apley Other Clinician: Referring Physician: Treating Physician/Extender: Cherre Blanc in Treatment: 29 Education Assessment Education Provided To: Patient Education Topics Provided Wound/Skin Impairment: Handouts: Caring for Your Ulcer Methods: Explain/Verbal Responses: Reinforcements needed [name], [name] ([id number]) [id number] Page 6 of 8 Electronic Signature(s) Signed: 12/29/2022 5:31:32 PM By: Shawn Stall RN, BSN Entered By: Shawn Stall on 12/29/2022 13:00:27 -------------------------------------------------------------------------------- Wound Assessment Details Patient Name: Date of Service: Hailey [name], DO [name] 12/29/2022 12:45 PM Medical Record Number: [id number] Patient Account Number: [id number] Date of Birth/Sex: Treating RN: [birth year] (77 y.o. [name], [name] Primary Care Daleen Steinhaus: Burton Apley Other Clinician: Referring Rees Santistevan: Treating Wilburt Messina/Extender: Cherre Blanc in Treatment: 29 Wound Status Wound Number: 5 Primary Cellulitis Etiology: Wound Location: Left, Distal, Anterior Lower Leg Wound Open Wounding Event: Gradually Appeared Status: Date Acquired: 04/19/2022 Comorbid Asthma, Sleep Apnea, Hypertension, Colitis, Rheumatoid Weeks Of Treatment: 29 History: Arthritis, Osteoarthritis Clustered Wound: No Photos Wound Measurements Length: (cm) Width: (cm) Depth: (cm) Area: (cm) Volume: (cm) 0.3 % Reduction in Area: 99.2% 0.5 % Reduction in Volume: 98.5% 0.2 Epithelialization: Large  (67-100%) 0.118 Tunneling: No 0.024 Undermining: No Wound Description Classification: Full Thickness With Exposed Suppor Wound Margin: Distinct, outline attached Exudate Amount: Medium Exudate Type: Serosanguineous Exudate Color: red, brown t Structures Foul Odor After Cleansing: No Slough/Fibrino Yes Wound Bed Granulation Amount: Large (67-100%) Exposed Structure Granulation Quality: Pink, Pale, Hyper-granulation Fascia Exposed: No Necrotic Amount: Small (1-33%) Fat Layer (Subcutaneous Tissue) Exposed: Yes Tendon Exposed: No Muscle Exposed: No Joint Exposed: No Bone Exposed: No Periwound Skin Texture Texture Color No Abnormalities Noted: No No Abnormalities Noted: No Callus: No Atrophie Blanche: No Crepitus: No Cyanosis: No [name], [name] ([id number]) [id number] Page 7 of 8 Excoriation: No Ecchymosis: No Induration: No Erythema: No Rash: No Hemosiderin Staining: No Scarring: No Mottled: No Pallor: No Moisture Rubor: No No Abnormalities Noted: No Dry / Scaly: No Temperature / Pain Maceration: No Temperature: No Abnormality Treatment Notes Wound #5 (Lower Leg) Wound Laterality: Left, Anterior, Distal Cleanser Wound Cleanser Discharge Instruction: Cleanse the wound with wound cleanser prior to applying a clean dressing using gauze sponges, not tissue or cotton balls. Peri-Wound Care Skin Prep Discharge Instruction: Use skin prep as directed Topical Primary Dressing Apligraf Discharge Instruction: applied by Bela Bonaparte. Adaptic and steri-strips Discharge Instruction: Used to secure Puraply AM. Leave in place. Do not remove. Secondary Dressing Zetuvit Plus Silicone Border Dressing 4x4 (in/in) Discharge Instruction: Apply silicone border over primary dressing as directed. Secured With Compression Wrap Compression Stockings Facilities manager) Signed: 12/29/2022 5:31:32 PM By: Shawn Stall RN, BSN Entered By: Shawn Stall on 12/29/2022 13:00:21 -------------------------------------------------------------------------------- Vitals Details Patient Name: Date of Service: Hailey [name], DO [name] 12/29/2022 12:45 PM Medical Record Number: [id number]  Patient Account Number: [id number] Date of Birth/Sex: Treating RN: 08-18-1945 (77 y.o. [name], [name] Primary Care Isabella Ida: Burton Apley Other Clinician: Referring Abbee Cremeens: Treating Cathleen Yagi/Extender: Cherre Blanc in Treatment: 29 Vital Signs Time Taken: 12:56 Temperature (F): 98.5 Height (in): 61 Pulse (bpm): 90 Weight (lbs): 186 Respiratory Rate (breaths/min): 24 Body Mass Index (BMI): 35.1 Blood Pressure (mmHg): 124/77 Reference Range: 80 - 120 mg / dl Airway Pulse Oximetry (%): 88 Notes patient SHOB with 6L 02 [place] sats before pursed lip breathing were 80% increased to 88%. Patient took her rescue inhaler. Per patient been nausea since last [name], [name] ([id number]) [phone].pdf Page 8 of 8 week not eating well. Per patient seen PCP placed on mecalzine. Physician made aware of SHOB and nausea. Electronic Signature(s) Signed: 12/29/2022 5:31:32 PM By: Shawn Stall RN, BSN Entered By: Shawn Stall on 12/29/2022 12:58:38

## 2023-01-05 ENCOUNTER — Encounter (HOSPITAL_BASED_OUTPATIENT_CLINIC_OR_DEPARTMENT_OTHER): Payer: Medicare Other | Admitting: Internal Medicine

## 2023-01-05 DIAGNOSIS — L97828 Non-pressure chronic ulcer of other part of left lower leg with other specified severity: Secondary | ICD-10-CM

## 2023-01-05 DIAGNOSIS — I87312 Chronic venous hypertension (idiopathic) with ulcer of left lower extremity: Secondary | ICD-10-CM | POA: Diagnosis not present

## 2023-01-05 DIAGNOSIS — L97829 Non-pressure chronic ulcer of other part of left lower leg with unspecified severity: Secondary | ICD-10-CM | POA: Diagnosis not present

## 2023-01-06 NOTE — Progress Notes (Signed)
KELIN, NIXON ([id number]) [id number] Page 1 of 7 Visit Report for 01/05/2023 Arrival Information Details Patient Name: Date of Service: [name] [name] 01/05/2023 12:30 PM Medical Record Number: [id number] Patient Account Number: [id number] Date of Birth/Sex: Treating RN: 1946-07-17 (77 y.o. [name], [name] Primary Care [name]: Burton Apley Other Clinician: Referring [name]: Treating [name]/Extender: Cherre Blanc in Treatment: 30 Visit Information History Since Last Visit Added or deleted any medications: Yes Patient Arrived: Wheel Chair Any new allergies or adverse reactions: No Arrival Time: 12:47 Had a fall or experienced change in No Accompanied By: husband activities of daily living that may affect Transfer Assistance: None risk of falls: Patient Identification Verified: Yes Signs or symptoms of abuse/neglect since last visito No Secondary Verification Process Completed: Yes Hospitalized since last visit: No Patient Requires Transmission-Based Precautions: No Implantable device outside of the clinic excluding No Patient Has Alerts: Yes cellular tissue based products placed in the center Patient Alerts: ABI's: L: N/C 11/23 since last visit: Has Dressing in Place as Prescribed: Yes Pain Present Now: No Electronic Signature(s) Signed: 01/05/2023 6:06:31 PM By: Shawn Stall RN, BSN Entered By: Shawn Stall on 01/05/2023 12:47:18 -------------------------------------------------------------------------------- Encounter Discharge Information Details Patient Name: Date of Service: [name], [name] 01/05/2023 12:30 PM Medical Record Number: [id number] Patient Account Number: [id number] Date of Birth/Sex: Treating RN: Nov 16, 1945 (77 y.o. [name], [name] Primary Care [name]: Burton Apley Other Clinician: Referring [name]: Treating [name]/Extender: Cherre Blanc in  Treatment: 30 Encounter Discharge Information Items Post Procedure Vitals Discharge Condition: Stable Temperature (F): 98.4 Ambulatory Status: Wheelchair Pulse (bpm): 94 Discharge Destination: Home Respiratory Rate (breaths/min): 20 Transportation: Private Auto Blood Pressure (mmHg): 127/68 Accompanied By: husband Schedule Follow-up Appointment: Yes Clinical Summary of Care: Electronic Signature(s) Signed: 01/05/2023 6:06:31 PM By: Shawn Stall RN, BSN Entered By: Shawn Stall on 01/05/2023 13:12:53 [name] ([id number]) [id number] Page 2 of 7 -------------------------------------------------------------------------------- Lower Extremity Assessment Details Patient Name: Date of Service: Hailey Razor S. 01/05/2023 12:30 PM Medical Record Number: [id number] Patient Account Number: [id number] Date of Birth/Sex: Treating RN: 01/16/1946 (77 y.o. [name], [name] Primary Care Ladelle Teodoro: Burton Apley Other Clinician: Referring Taryne Kiger: Treating [name]/Extender: Cherre Blanc in Treatment: 30 Edema Assessment Assessed: [name]: Yes] Franne Forts: No] Edema: [Left: N] [Right: o] Calf Left: Right: Point of Measurement: 29 cm From Medial Instep 30 cm Ankle Left: Right: Point of Measurement: 9 cm From Medial Instep 21 cm Vascular Assessment Pulses: Dorsalis Pedis Palpable: [Left:Yes] Electronic Signature(s) Signed: 01/05/2023 6:06:31 PM By: Shawn Stall RN, BSN Entered By: Shawn Stall on 01/05/2023 12:49:48 -------------------------------------------------------------------------------- Multi Wound Chart Details Patient Name: Date of Service: [name], [name] 01/05/2023 12:30 PM Medical Record Number: [id number] Patient Account Number: [id number] Date of Birth/Sex: Treating RN: [birth year] (77 y.o. F) Primary Care [name]: Burton Apley Other Clinician: Referring Deslyn Cavenaugh: Treating Marquon Alcala/Extender: Cherre Blanc in Treatment: 30 Vital Signs Height(in): 61 Pulse(bpm): 94 Weight(lbs): 186 Blood Pressure(mmHg): 127/68 Body Mass Index(BMI): 35.1 Temperature(F): 98.4 Respiratory Rate(breaths/min): 20 [5:Photos:] [N/A:N/A] Left, Distal, Anterior Lower Leg N/A N/A Wound Location: Gradually Appeared N/A N/A Wounding Event: Cellulitis N/A N/A Primary Etiology: Asthma, Sleep Apnea, Hypertension, N/A N/A Comorbid History: Colitis, Rheumatoid Arthritis, Osteoarthritis 04/19/2022 N/A N/A Date Acquired: 30 N/A N/A Weeks of Treatment: Open N/A N/A Wound Status: No N/A N/A Wound Recurrence: 0.3x0.1x0.1 N/A N/A Measurements L x W x D (cm) 0.024 N/A  N/A A (cm) : rea 0.002 N/A N/A Volume (cm) : 99.80% N/A N/A % Reduction in Area: 99.90% N/A N/A % Reduction in Volume: Full Thickness With Exposed Support N/A N/A Classification: Structures None Present N/A N/A Exudate Amount: Distinct, outline attached N/A N/A Wound Margin: None Present (0%) N/A N/A Granulation Amount: None Present (0%) N/A N/A Necrotic Amount: Fascia: No N/A N/A Exposed Structures: Fat Layer (Subcutaneous Tissue): No Tendon: No Muscle: No Joint: No Bone: No Large (67-100%) N/A N/A Epithelialization: Excoriation: No N/A N/A Periwound Skin Texture: Induration: No Callus: No Crepitus: No Rash: No Scarring: No Maceration: No N/A N/A Periwound Skin Moisture: Dry/Scaly: No Atrophie Blanche: No N/A N/A Periwound Skin Color: Cyanosis: No Ecchymosis: No Erythema: No Hemosiderin Staining: No Mottled: No Pallor: No Rubor: No No Abnormality N/A N/A Temperature: Cellular or Tissue Based Product N/A N/A Procedures Performed: Treatment Notes Wound #5 (Lower Leg) Wound Laterality: Left, Anterior, Distal Cleanser Wound Cleanser Discharge Instruction: Cleanse the wound with wound cleanser prior to applying a clean dressing using gauze sponges, not tissue or cotton  balls. Peri-Wound Care Skin Prep Discharge Instruction: Use skin prep as directed Topical Primary Dressing Apligraf Discharge Instruction: applied by Yashar [name]. Adaptic and steri-strips Discharge Instruction: Used to secure Puraply AM. Leave in place. Hailey not remove. Secondary Dressing Zetuvit Plus Silicone Border Dressing 4x4 (in/in) Discharge Instruction: Apply silicone border over primary dressing as directed. Secured With [employer] Compression [employer]) [name], [name] ([id number]) [id number] Page 4 of 7 Signed: 01/05/2023 4:07:01 PM By: Geralyn Corwin Hailey Entered By: Geralyn Corwin on 01/05/2023 13:17:53 -------------------------------------------------------------------------------- Multi-Disciplinary Care Plan Details Patient Name: Date of Service: Hailey [name], [name] 01/05/2023 12:30 PM Medical Record Number: [id number] Patient Account Number: [id number] Date of Birth/Sex: Treating RN: 29-Mar-1946 (77 y.o. [name], [name] Primary Care Stpehen Petitjean: Burton Apley Other Clinician: Referring Navi Erber: Treating Aislin Onofre/Extender: Cherre Blanc in Treatment: 30 Active Inactive Pain, Acute or Chronic Nursing Diagnoses: Pain, acute or chronic: actual or potential Potential alteration in comfort, pain Goals: Patient will verbalize adequate pain control and receive pain control interventions during procedures as needed Date Initiated: 06/03/2022 Target Resolution Date: 01/13/2023 Goal Status: Active Patient/caregiver will verbalize comfort level met Date Initiated: 06/03/2022 Target Resolution Date: 01/13/2023 Goal Status: Active Interventions: Encourage patient to take pain medications as prescribed Provide education on pain management Treatment Activities: Administer pain control measures as ordered : 06/03/2022 Notes: Electronic Signature(s) Signed: 01/05/2023 6:06:31 PM By:  Shawn Stall RN, BSN Entered By: Shawn Stall on 01/05/2023 12:51:18 -------------------------------------------------------------------------------- Pain Assessment Details Patient Name: Date of Service: [name], [name] 01/05/2023 12:30 PM Medical Record Number: [id number] Patient Account Number: [id number] Date of Birth/Sex: Treating RN: 05/01/1946 (77 y.o. [name], [name] Primary Care [name]: Burton Apley Other Clinician: Referring Bayley Yarborough: Treating Andrue Dini/Extender: Cherre Blanc in Treatment: 30 Active Problems Location of Pain Severity and Description of Pain Patient Has Paino No Site Locations [place], [place] ([id number]) [id number] Page 5 of 7 Pain Management and Medication Current Pain Management: Electronic Signature(s) Signed: 01/05/2023 6:06:31 PM By: Shawn Stall RN, BSN Entered By: Shawn Stall on 01/05/2023 12:48:06 -------------------------------------------------------------------------------- Patient/Caregiver Education Details Patient Name: Date of Service: Hailey [name], Hailey RO Leanne Lovely 6/20/2024andnbsp12:30 PM Medical Record Number: [id number] Patient Account Number: [id number] Date of Birth/Gender: Treating RN: 1945-09-30 (77 y.o. [name], [name] Primary Care Physician: Burton Apley Other Clinician: Referring Physician: Treating Physician/Extender: Cherre Blanc in Treatment: 30  Education Assessment Education Provided To: Patient and Caregiver husband Education Topics Provided Wound/Skin Impairment: Handouts: Caring for Your Ulcer Methods: Explain/Verbal Responses: Reinforcements needed Electronic Signature(s) Signed: 01/05/2023 6:06:31 PM By: Shawn Stall RN, BSN Entered By: Shawn Stall on 01/05/2023 12:51:32 -------------------------------------------------------------------------------- Wound Assessment Details Patient Name: Date of Service: Hailey [name], Hailey RO THY  S. 01/05/2023 12:30 PM Medical Record Number: [id number] Patient Account Number: [id number] Date of Birth/Sex: Treating RN: August 12, 1945 (77 y.o. [name] Primary Care Zeddie Njie: Burton Apley Other Clinician: [name] ([id number]) [id number] Page 6 of 7 Referring Sravya Grissom: Treating Fidelia Cathers/Extender: Cherre Blanc in Treatment: 30 Wound Status Wound Number: 5 Primary Cellulitis Etiology: Wound Location: Left, Distal, Anterior Lower Leg Wound Open Wounding Event: Gradually Appeared Status: Date Acquired: 04/19/2022 Comorbid Asthma, Sleep Apnea, Hypertension, Colitis, Rheumatoid Weeks Of Treatment: 30 History: Arthritis, Osteoarthritis Clustered Wound: No Photos Wound Measurements Length: (cm) 0.3 Width: (cm) 0.1 Depth: (cm) 0.1 Area: (cm) 0.024 Volume: (cm) 0.002 % Reduction in Area: 99.8% % Reduction in Volume: 99.9% Epithelialization: Large (67-100%) Tunneling: No Undermining: No Wound Description Classification: Full Thickness With Exposed Support Structures Wound Margin: Distinct, outline attached Exudate Amount: None Present Foul Odor After Cleansing: No Slough/Fibrino Yes Wound Bed Granulation Amount: None Present (0%) Exposed Structure Necrotic Amount: None Present (0%) Fascia Exposed: No Fat Layer (Subcutaneous Tissue) Exposed: No Tendon Exposed: No Muscle Exposed: No Joint Exposed: No Bone Exposed: No Periwound Skin Texture Texture Color No Abnormalities Noted: No No Abnormalities Noted: No Callus: No Atrophie Blanche: No Crepitus: No Cyanosis: No Excoriation: No Ecchymosis: No Induration: No Erythema: No Rash: No Hemosiderin Staining: No Scarring: No Mottled: No Pallor: No Moisture Rubor: No No Abnormalities Noted: No Dry / Scaly: No Temperature / Pain Maceration: No Temperature: No Abnormality Treatment Notes Wound #5 (Lower Leg) Wound Laterality: Left, Anterior,  Distal Cleanser Wound Cleanser Discharge Instruction: Cleanse the wound with wound cleanser prior to applying a clean dressing using gauze sponges, not tissue or cotton balls. Peri-Wound Care Skin Prep Discharge Instruction: Use skin prep as directed Topical [name], [name] ([id number]) [id number] Page 7 of 7 Primary Dressing Apligraf Discharge Instruction: applied by [name]. Adaptic and steri-strips Discharge Instruction: Used to secure Puraply AM. Leave in place. Hailey not remove. Secondary Dressing Zetuvit Plus Silicone Border Dressing 4x4 (in/in) Discharge Instruction: Apply silicone border over primary dressing as directed. Secured With Compression W[employer] Compression [employer] Facilities manager) Signed: 01/05/2023 6:06:31 PM By: Shawn Stall RN, BSN Entered By: Shawn Stall on 01/05/2023 12:56:45 -------------------------------------------------------------------------------- Vitals Details Patient Name: Date of Service: Hailey [name], [name] 01/05/2023 12:30 PM Medical Record Number: [id number] Patient Account Number: [id number] Date of Birth/Sex: Treating RN: 12-26-1945 (77 y.o. [name], [name] Primary Care Rayaan Lorah: Burton Apley Other Clinician: Referring Iyannah Blake: Treating Adriell Polansky/Extender: Cherre Blanc in Treatment: 30 Vital Signs Time Taken: 12:45 Temperature (F): 98.4 Height (in): 61 Pulse (bpm): 94 Weight (lbs): 186 Respiratory Rate (breaths/min): 20 Body Mass Index (BMI): 35.1 Blood Pressure (mmHg): 127/68 Reference Range: 80 - 120 mg / dl Notes per patient unable to eat stomach pain with nausea, per patient PCP added augmentin for 10 days yesterday. Electronic Signature(s) Signed: 01/05/2023 6:06:31 PM By: Shawn Stall RN, BSN Entered By: Shawn Stall on 01/05/2023 12:48:02

## 2023-01-06 NOTE — Progress Notes (Signed)
[name] ([id number]) [id number] Page 1 of 11 Visit Report for 01/05/2023 Chief Complaint Document Details Patient Name: Date of Service: [name] RO [facility] S. 01/05/2023 12:30 PM Medical Record Number: [id number] Patient Account Number: [id number] Date of Birth/Sex: Treating RN: 10-13-1945 (77 y.o. F) Primary Care Provider: Burton [name] Other Clinician: Referring Provider: Treating Provider/Extender: [name] in Treatment: 30 Information Obtained from: Patient Chief Complaint 06/03/2022; Left lower extremity wounds Electronic Signature(s) Signed: 01/05/2023 4:07:01 PM By: Hailey Corwin DO Entered By: [name] on 01/05/2023 13:17:58 -------------------------------------------------------------------------------- Cellular or Tissue Based Product Details Patient Name: Date of Service: Hailey Diamond, DO [name] 01/05/2023 12:30 PM Medical Record Number: [id number] Patient Account Number: [id number] Date of Birth/Sex: Treating RN: Feb 02, 1946 (77 y.o. [name], [name] Primary Care Provider: Burton [name] Other Clinician: Referring Provider: Treating Provider/Extender: [name] in Treatment: 30 Cellular or Tissue Based Product Type Wound #5 Left,Distal,Anterior Lower Leg Applied to: Performed By: Physician Hailey Corwin, DO Cellular or Tissue Based Product Type: Apligraf Level of Consciousness (Pre-procedure): Awake and Alert Pre-procedure Verification/Time Out Yes - 13:05 Taken: Location: trunk / arms / legs Wound Size (sq cm): 0.03 Product Size (sq cm): 44 Waste Size (sq cm): 40 Waste Reason: wound size Amount of Product Applied (sq cm): 4 Instrument Used: Forceps, Scissors Lot #: [id number] Order #: 4 Expiration Date: 01/14/2023 Fenestrated: Yes Instrument: Blade Reconstituted: Yes Solution Type: normal saline Solution Amount: 10mL Lot #: [id number] Solution  Expiration Date: 12/15/2024 Secured: Yes Secured With: Steri-Strips, adaptic Dressing Applied: No Procedural Pain: 0 Post Procedural Pain: 0 Response to Treatment: Procedure was tolerated well [name] ([id number]) [id number] Page 2 of 11 Level of Consciousness (Post- Awake and Alert procedure): Post Procedure Diagnosis Same as Pre-procedure Electronic Signature(s) Signed: 01/05/2023 4:07:01 PM By: Hailey Corwin DO Signed: 01/05/2023 6:06:31 PM By: Hailey Stall RN, BSN Entered By: [name] on 01/05/2023 13:09:10 -------------------------------------------------------------------------------- HPI Details Patient Name: Date of Service: Hailey Diamond, DO [name] 01/05/2023 12:30 PM Medical Record Number: [id number] Patient Account Number: [id number] Date of Birth/Sex: Treating RN: [birth year] (77 y.o. F) Primary Care Provider: Burton [name] Other Clinician: Referring Provider: Treating Provider/Extender: [name] in Treatment: 30 History of Present Illness HPI Description: Patient presents today for initial evaluation and our clinic as a referral from the [facility] health system Department of orthopedics for evaluation and treatment of the wound to his his of the left foot at the base of the great toe. Currently the good news is the patient really does not have any significant pain which is excellent. She has been seen by [name] at [facility]: infectious disease clinic that is the regional Center for infectious disease. Subsequently the patient was cultured and positive for methicillin sensitive staph aureus. She has been on antibiotics including Cipro prior to surgery as will several days following surgery. She then had clindamycin which was changed to doxycycline. She took this for 10 days. Subsequently the pain had improved and there was no longer any possible draining from the wound according to the notes. The  patient sedentary rate as well as C-reactive protein have returned to normal ranges. Currently per [name] assessment there was one dehiscence with no sign of osteomyelitis. He therefore discontinue the antibiotics with the completion of the last three days Of doxycycline. Other than that he just set her for a follow-up as needed. The patient does not have diabetes and is  not a current smoker. At this time her treatment that was recommended by the surgeon was applying a Hydrocolloid dressing. Based on what I'm seeing at this point the patient actually has some Slough noted over the surface of the wound there really does not appear to be evidence of infection though I do think she does require some sharp debridement to clear away the slough and help with appropriate wound healing. She does have areas of granulation buds noted. She may be a candidate for a skin substitute as well. We will see how things do over the next period of time until follow-up. No fevers, chills, nausea, or vomiting noted at this time. 06/13/18 on evaluation today patient actually appears to be doing better in regard to her toe ulcer. She's been using the Prisma on this region and that seems to have done extremely well for her. Fortunately there does not appear to be evidence of infection at this time which is good news. She did see her surgeon they were extremely pleased with the overall appearance of her wound. 06/27/18 on evaluation today patient actually appears to be doing very well in regard to her foot ulcer. She has been tolerating the dressing changes without complication which is great news. She did see her podiatrist they felt like everything is looking very nice as well and will please. 07/05/2018 surgical wound on the left foot. She appears to be doing well. Still surface debrided to remove however in general post debridement the wound bed looks quite healthy it is come down significantly in terms of dimensions  using silver collagen. The patient describes pain when her foot is elevated either in bed at night [sometimes keeps her awake] or when is propped up on a foot rest. She basically takes analgesics for this. She does not really describe claudication with activity however her activity is very limited by interstitial lung disease. Her ABIs initially in this clinic were noncompressible. She is not a diabetic 07/20/2018; surgical wound on the left foot. Wound actually looks quite a bit better than I remember seeing this. She is still describing pain with her leg elevated at night that she does not get when the wound is supine. She does not really describe claudication but she is very limited by her pulmonary status. We have been using silver collagen. 1/17; the patient has been followed up by orthopedics and discharge. Her arterial studies were actually quite good and should not be contributing to any pain. Slight reduction in ABIs but otherwise normal. Her wound is closed. She saw [name] of infectious disease surrounding the surgery. By review of our notes she was not felt to have osteomyelitis. 06/03/2022 [name] is a 77 year old female with a past medical history of interstitial lung disease on chronic oxygen via nasal cannula, rheumatoid arthritis, chronic diastolic heart failure and OSA that presents to the clinic for a 1 to 22-month history of nonhealing ulcer to the left lower extremity. On 04/20/2022 she was admitted to the hospital for left lower extremity cellulitis. She required 8 days of IV vancomycin and Unasyn followed by a 14-day course of Augmentin and doxycycline. She has been using Silvadene cream to the area. She does not use compression therapy but does own compression stockings. She currently denies signs of infection. 11/27; patient presents for follow-up she is using Medihoney and Hydrofera Blue to the wound beds. She has been using her Tubigrip. She has no issues  or complaints today. 12/4; patient presents for follow-up.  She continues to use Medihoney and Hydrofera Blue to the wound beds. Several attempts have been made to schedule ABIs with TBI's. We gave patient the number today to call to have these scheduled. She has been using Tubigrip. She has no issues or complaints today. 12/18; she continues to use Medihoney and Hydrofera Blue in general the surface of the wound is looking somewhat better this week and measurements are slightly smaller. She did have her arterial studies done these were noncompressible bilaterally however TBI on the right of 0.69 on the left 0.62. Waveforms on the right were triphasic on the left biphasic to triphasic. This does not suggest significant arterial disease to make healing wounds at this location difficult. 12/29; patient presents for follow-up. She has been using Medihoney and Hydrofera Blue to the wound bed. She has been using Tubigrip. 1/5; patient presents for follow-up. She is been using Medihoney and Hydrofera Blue to the wound bed along with Tubigrip. The Tubigrip was doubled at last ARLEN, LEGENDRE ([id number]) [phone].pdf Page 3 of 11 clinic visit. There is been improvement in wound healing. 1/12; patient presents for follow-up. She has been using Medihoney and Hydrofera Blue to the wound bed along with Tubigrip. There continues to be improvement in wound healing. 1/19; patient presents for follow-up. She has been using Medihoney and Hydrofera Blue to the wound bed along with her juxta light compression daily. There is been improvement in wound measurements. Patient has no issues or complaints today. 1/25; patient presents for follow-up. She has been using blast X and collagen to the wound bed under juxta light compression daily. She has no issues or complaints today. There is been improvement in wound healing. 2/1; patient presents for follow-up. She has been using blast X and collagen  to the wound bed under juxta light compression daily. She has no issues or complaints today. 2/9; patient presents for follow-up. She has been using Hydrofera Blue with blast X under compression wrap daily. She has been approved for a skin substitute and patient would like to proceed with this. 2/15; patient presents for follow-up. PuraPly was placed in standard fashion to the large anterior leg wound. She has been using blast X and Hydrofera Blue to the more proximal small wound. She has been using her juxta light compression daily. She has no issues or complaints today. 2/23; patient presents for follow-up. PuraPly was placed in standard fashion to the large anterior leg wound. The more proximal small wound is almost healed and they have been using blast X and Hydrofera Blue. She has been using her juxta light compression daily to the left lower extremity. There is been improvement in wound healing. 2/29; patient presents for follow-up. PuraPly was placed in standard fashion to the large anterior leg wound. The more proximal wound has healed. She is been using her juxta light compression daily. She has no issues or complaints today. 3/8; patient presents for follow-up. PuraPly has been used to the wound bed weekly patient has done well with this. She has been using her juxta light compression daily. She has no issues or complaints today. 3/15; patient presents for follow-up. We have been using PuraPly to the wound bed weekly. She has been using her juxta light compression daily. She has been doing well with this treatment. Her wound has improved. She has no issues or complaints today. 3/22; patient presents for follow-up. We have been using PuraPly to the wound bed weekly. She has been using her juxta lite compression. Overall  more granulation tissue present today. Patient has no issues or complaints today. 3/29; patient presents for follow-up. We have been using PuraPly to the wound bed weekly.  She has been using her juxta lite compression daily. The wound is smaller. 10/20/2022: The wound continues to contract. There is some slough accumulation in the deepest portion of the wound. No concern for infection. 4/12; patient presents for follow-up. We have been placing PuraPly to the wound bed weekly. There has been improvement in wound healing. 4/18; patient presents for follow-up. We have been using PuraPly to the wound bed. There continues to be improvement in wound healing. She has no issues or complaints. 4/25; Patient presents for follow-up. We have been using PuraPly to the wound bed. The wound is smaller. 5/2; patient presents for follow-up. We have been using PuraPly. The wound is smaller. 5/9; patient presents for follow-up. We have been using PuraPly. Again wound is smaller. 5/16; patient presents for follow-up. We have been using PuraPly. Wound is stable. No signs of infection. She appears ready for Apligraf and this was discussed with the patient. We will proceed with this next week. 5/23; Patient presents for follow-up. Patient was to start Apligraf today however this was not delivered. We went ahead and replaced PuraPly. Overall wound appears well-healing. 5/30; patient presents for follow-up. We have been using PuraPly. Apligraf was available and this was placed in office today. Wound is smaller. 6/6; Apligraf at #2 applied today. Wound is improved surface area is better 6/13; patient presents for follow-up. Apligraf #3 was applied today. Wound is smaller. 6/20; patient presents for follow up. Apligraf #4 was applied today. Wound is smaller. Electronic Signature(s) Signed: 01/05/2023 4:07:01 PM By: Hailey Corwin DO Entered By: [name] on 01/05/2023 13:19:29 -------------------------------------------------------------------------------- Physical Exam Details Patient Name: Date of Service: Hailey Diamond, DO [name] 01/05/2023 12:30 PM Medical Record Number:  [id number] Patient Account Number: [id number] Date of Birth/Sex: Treating RN: June 04, 1946 (77 y.o. F) Primary Care Provider: Burton [name] Other Clinician: Referring Provider: Treating Provider/Extender: [name] in Treatment: [address], [place] S ([id number]) [id number] Page 4 of 11 Constitutional respirations regular, non-labored and within target range for patient.. Cardiovascular 2+ dorsalis pedis/posterior tibialis pulses. Psychiatric pleasant and cooperative. Notes T the right lower extremity there is a small open wound with granulation tissue at the base. No signs of surrounding infection. Good edema control. o Electronic Signature(s) Signed: 01/05/2023 4:07:01 PM By: Hailey Corwin DO Entered By: [name] on 01/05/2023 13:20:02 -------------------------------------------------------------------------------- Physician Orders Details Patient Name: Date of Service: Hailey Diamond, DO [name] 01/05/2023 12:30 PM Medical Record Number: [id number] Patient Account Number: [id number] Date of Birth/Sex: Treating RN: 03-26-1946 (77 y.o. [name], [name] Primary Care Provider: Burton [name] Other Clinician: Referring Provider: Treating Provider/Extender: [name] in Treatment: 30 Verbal / Phone Orders: No Diagnosis Coding ICD-10 Coding Code Description [id number] Non-pressure chronic ulcer of other part of left lower leg with other specified severity I87.312 Chronic venous hypertension (idiopathic) with ulcer of left lower extremity J84.9 Interstitial pulmonary disease, unspecified M06.9 Rheumatoid arthritis, unspecified Follow-up Appointments ppointment in 1 week. - Dr. Mikey Bussing room 8 Thursday 1100 01/12/2023 Return A ppointment in 2 weeks. - 01/20/2023 Friday 1100 Dr. Mikey Bussing room 8 Return A Anesthetic (In clinic) Topical Lidocaine 5% applied to wound bed Cellular or Tissue Based  Products Wound #5 Left,Distal,Anterior Lower Leg Cellular or Tissue Based Product Type: - 08/18/2022 insurance ran organogenesis- 100% covered. 08/26/2022 #1 Puraply  AM applied. 09/01/2022 #2 Puraply AM applied. 09/09/2022 #3 Puraply AM applied to both wounds. 09/15/2022 #4 Puraply AM applied to wound. 09/23/2022 #5 Puraply AM applied to wound. 09/30/22 # 6 PUraply AM applied to wound. 10/07/22 #7 Puraply AM applied to wound. 10/14/2022 #8 Puraply AM applied to wound. 10/20/22 #9 Puraply AM applied to wound 10/28/2022 #11 Puraply AM applied to wound. 11/03/2022 #12 Puraply AM applied to wound. 11/10/2022 #13 Puraply AM applied to wound. 11/17/2022 #14 Puraply AM applied to wound bed. 11/24/2022 #15 puraply AM applied to wound bed. 12/01/2022 #16 Puraply AM applied to wound. Plan for apligraf for next week. 12/08/2022 #17 Puraply AM applied to wound. Apligraf not delivered, ordered for next week 12/15/2022 Apligraf #1 applied to wound bed. 12/22/2022 Apligraf #2 applied to wound bed. 12/29/2022 apligraf #3 applied to wound bed. 01/05/2023 #4 apligraf applied to wound bed. Cellular or Tissue Based Product applied to wound bed, secured with steri-strips, cover with Adaptic or Mepitel. (DO NOT REMOVE). [name], [name] ([id number]) [id number] Page 5 of 11 Bathing/ Shower/ Hygiene May shower with protection but do not get wound dressing(s) wet. Protect dressing(s) with water repellant cover (for example, large plastic bag) or a cast cover and may then take shower. Edema Control - Lymphedema / SCD / Other Elevate legs to the level of the heart or above for 30 minutes daily and/or when sitting for 3-4 times a day throughout the day. Avoid standing for long periods of time. Exercise regularly Compression stocking or Garment 20-30 mm/Hg pressure to: - start wearing the juxtalite HD apply in the morning and remove at night left leg. Non Wound Condition Protect area with: - buttock area with  AandD ointment daily Wound Treatment Wound #5 - Lower Leg Wound Laterality: Left, Anterior, Distal Cleanser: Wound Cleanser (Generic) 1 x Per Week/30 Days Discharge Instructions: Cleanse the wound with wound cleanser prior to applying a clean dressing using gauze sponges, not tissue or cotton balls. Peri-Wound Care: Skin Prep (Generic) 1 x Per Week/30 Days Discharge Instructions: Use skin prep as directed Prim Dressing: Apligraf 1 x Per Week/30 Days ary Discharge Instructions: applied by provider. Prim Dressing: Adaptic and steri-strips 1 x Per Week/30 Days ary Discharge Instructions: Used to secure Puraply AM. Leave in place. Do not remove. Secondary Dressing: Zetuvit Plus Silicone Border Dressing 4x4 (in/in) (Generic) 1 x Per Week/30 Days Discharge Instructions: Apply silicone border over primary dressing as directed. Electronic Signature(s) Signed: 01/05/2023 4:07:01 PM By: Hailey Corwin DO Entered By: [name] on 01/05/2023 13:20:12 -------------------------------------------------------------------------------- Problem List Details Patient Name: Date of Service: Hailey Diamond, DO [name] 01/05/2023 12:30 PM Medical Record Number: [id number] Patient Account Number: [id number] Date of Birth/Sex: Treating RN: 1945/12/13 (77 y.o. [name], [name] Primary Care Provider: Burton [name] Other Clinician: Referring Provider: Treating Provider/Extender: [name] in Treatment: 30 Active Problems ICD-10 Encounter Code Description Active Date MDM Diagnosis [id number] Non-pressure chronic ulcer of other part of left lower leg with other specified 06/03/2022 No Yes severity I87.312 Chronic venous hypertension (idiopathic) with ulcer of left lower extremity 06/03/2022 No Yes J84.9 Interstitial pulmonary disease, unspecified 06/03/2022 No Yes M06.9 Rheumatoid arthritis, unspecified 06/03/2022 No Yes YEILYN, GENT ([id number])  [id number] Page 6 of 11 Inactive Problems Resolved Problems Electronic Signature(s) Signed: 01/05/2023 4:07:01 PM By: Hailey Corwin DO Entered By: [name] on 01/05/2023 13:17:49 -------------------------------------------------------------------------------- Progress Note Details Patient Name: Date of Service: [name], DO [name] 01/05/2023 12:30 PM Medical Record Number:  [id number] Patient Account Number: [id number] Date of Birth/Sex: Treating RN: [birth year] (76 y.o. F) Primary Care Provider: Burton [name] Other Clinician: Referring Provider: Treating Provider/Extender: [name] in Treatment: 30 Subjective Chief Complaint Information obtained from Patient 06/03/2022; Left lower extremity wounds History of Present Illness (HPI) Patient presents today for initial evaluation and our clinic as a referral from the [facility] health system Department of orthopedics for evaluation and treatment of the wound to his his of the left foot at the base of the great toe. Currently the good news is the patient really does not have any significant pain which is excellent. She has been seen by [name] at [facility]: infectious disease clinic that is the regional Center for infectious disease. Subsequently the patient was cultured and positive for methicillin sensitive staph aureus. She has been on antibiotics including Cipro prior to surgery as will several days following surgery. She then had clindamycin which was changed to doxycycline. She took this for 10 days. Subsequently the pain had improved and there was no longer any possible draining from the wound according to the notes. The patient sedentary rate as well as C-reactive protein have returned to normal ranges. Currently per [name] assessment there was one dehiscence with no sign of osteomyelitis. He therefore discontinue the antibiotics with the completion of the  last three days Of doxycycline. Other than that he just set her for a follow-up as needed. The patient does not have diabetes and is not a current smoker. At this time her treatment that was recommended by the surgeon was applying a Hydrocolloid dressing. Based on what I'm seeing at this point the patient actually has some Slough noted over the surface of the wound there really does not appear to be evidence of infection though I do think she does require some sharp debridement to clear away the slough and help with appropriate wound healing. She does have areas of granulation buds noted. She may be a candidate for a skin substitute as well. We will see how things do over the next period of time until follow-up. No fevers, chills, nausea, or vomiting noted at this time. 06/13/18 on evaluation today patient actually appears to be doing better in regard to her toe ulcer. She's been using the Prisma on this region and that seems to have done extremely well for her. Fortunately there does not appear to be evidence of infection at this time which is good news. She did see her surgeon they were extremely pleased with the overall appearance of her wound. 06/27/18 on evaluation today patient actually appears to be doing very well in regard to her foot ulcer. She has been tolerating the dressing changes without complication which is great news. She did see her podiatrist they felt like everything is looking very nice as well and will please. 07/05/2018 surgical wound on the left foot. She appears to be doing well. Still surface debrided to remove however in general post debridement the wound bed looks quite healthy it is come down significantly in terms of dimensions using silver collagen. The patient describes pain when her foot is elevated either in bed at night [sometimes keeps her awake] or when is propped up on a foot rest. She basically takes analgesics for this. She does not really describe claudication  with activity however her activity is very limited by interstitial lung disease. Her ABIs initially in this clinic were noncompressible. She is not a diabetic 07/20/2018; surgical wound on the left  foot. Wound actually looks quite a bit better than I remember seeing this. She is still describing pain with her leg elevated at night that she does not get when the wound is supine. She does not really describe claudication but she is very limited by her pulmonary status. We have been using silver collagen. 1/17; the patient has been followed up by orthopedics and discharge. Her arterial studies were actually quite good and should not be contributing to any pain. Slight reduction in ABIs but otherwise normal. Her wound is closed. She saw [name] of infectious disease surrounding the surgery. By review of our notes she was not felt to have osteomyelitis. 06/03/2022 Ms. Brilyn Tuller is a 77 year old female with a past medical history of interstitial lung disease on chronic oxygen via nasal cannula, rheumatoid arthritis, chronic diastolic heart failure and OSA that presents to the clinic for a 1 to 69-month history of nonhealing ulcer to the left lower extremity. On 04/20/2022 she was admitted to the hospital for left lower extremity cellulitis. She required 8 days of IV vancomycin and Unasyn followed by a 14-day course of Augmentin and doxycycline. She has been using Silvadene cream to the area. She does not use compression therapy but does own compression stockings. She currently denies signs of infection. 11/27; patient presents for follow-up she is using Medihoney and Hydrofera Blue to the wound beds. She has been using her Tubigrip. She has no issues or complaints today. 12/4; patient presents for follow-up. She continues to use Medihoney and Hydrofera Blue to the wound beds. Several attempts have been made to schedule ABIs with TBI's. We gave patient the number today to call to have these scheduled. She  has been using Tubigrip. She has no issues or complaints today. 12/18; she continues to use Medihoney and Hydrofera Blue in general the surface of the wound is looking somewhat better this week and measurements are slightly smaller. She did have her arterial studies done these were noncompressible bilaterally however TBI on the right of 0.69 on the left 0.62. Waveforms on the right were triphasic on the left biphasic to triphasic. This does not suggest significant arterial disease to make healing wounds at this location difficult. 12/29; patient presents for follow-up. She has been using Medihoney and Hydrofera Blue to the wound bed. She has been using Tubigrip. [name], [name] ([id number]) [id number] Page 7 of 11 1/5; patient presents for follow-up. She is been using Medihoney and Hydrofera Blue to the wound bed along with Tubigrip. The Tubigrip was doubled at last clinic visit. There is been improvement in wound healing. 1/12; patient presents for follow-up. She has been using Medihoney and Hydrofera Blue to the wound bed along with Tubigrip. There continues to be improvement in wound healing. 1/19; patient presents for follow-up. She has been using Medihoney and Hydrofera Blue to the wound bed along with her juxta light compression daily. There is been improvement in wound measurements. Patient has no issues or complaints today. 1/25; patient presents for follow-up. She has been using blast X and collagen to the wound bed under juxta light compression daily. She has no issues or complaints today. There is been improvement in wound healing. 2/1; patient presents for follow-up. She has been using blast X and collagen to the wound bed under juxta light compression daily. She has no issues or complaints today. 2/9; patient presents for follow-up. She has been using Hydrofera Blue with blast X under compression wrap daily. She has been approved for a skin  substitute and  patient would like to proceed with this. 2/15; patient presents for follow-up. PuraPly was placed in standard fashion to the large anterior leg wound. She has been using blast X and Hydrofera Blue to the more proximal small wound. She has been using her juxta light compression daily. She has no issues or complaints today. 2/23; patient presents for follow-up. PuraPly was placed in standard fashion to the large anterior leg wound. The more proximal small wound is almost healed and they have been using blast X and Hydrofera Blue. She has been using her juxta light compression daily to the left lower extremity. There is been improvement in wound healing. 2/29; patient presents for follow-up. PuraPly was placed in standard fashion to the large anterior leg wound. The more proximal wound has healed. She is been using her juxta light compression daily. She has no issues or complaints today. 3/8; patient presents for follow-up. PuraPly has been used to the wound bed weekly patient has done well with this. She has been using her juxta light compression daily. She has no issues or complaints today. 3/15; patient presents for follow-up. We have been using PuraPly to the wound bed weekly. She has been using her juxta light compression daily. She has been doing well with this treatment. Her wound has improved. She has no issues or complaints today. 3/22; patient presents for follow-up. We have been using PuraPly to the wound bed weekly. She has been using her juxta lite compression. Overall more granulation tissue present today. Patient has no issues or complaints today. 3/29; patient presents for follow-up. We have been using PuraPly to the wound bed weekly. She has been using her juxta lite compression daily. The wound is smaller. 10/20/2022: The wound continues to contract. There is some slough accumulation in the deepest portion of the wound. No concern for infection. 4/12; patient presents for follow-up. We  have been placing PuraPly to the wound bed weekly. There has been improvement in wound healing. 4/18; patient presents for follow-up. We have been using PuraPly to the wound bed. There continues to be improvement in wound healing. She has no issues or complaints. 4/25; Patient presents for follow-up. We have been using PuraPly to the wound bed. The wound is smaller. 5/2; patient presents for follow-up. We have been using PuraPly. The wound is smaller. 5/9; patient presents for follow-up. We have been using PuraPly. Again wound is smaller. 5/16; patient presents for follow-up. We have been using PuraPly. Wound is stable. No signs of infection. She appears ready for Apligraf and this was discussed with the patient. We will proceed with this next week. 5/23; Patient presents for follow-up. Patient was to start Apligraf today however this was not delivered. We went ahead and replaced PuraPly. Overall wound appears well-healing. 5/30; patient presents for follow-up. We have been using PuraPly. Apligraf was available and this was placed in office today. Wound is smaller. 6/6; Apligraf at #2 applied today. Wound is improved surface area is better 6/13; patient presents for follow-up. Apligraf #3 was applied today. Wound is smaller. 6/20; patient presents for follow up. Apligraf #4 was applied today. Wound is smaller. Patient History Information obtained from Patient. Family History Cancer - Maternal Grandparents, Diabetes - Siblings, Heart Disease - Maternal Grandparents,Paternal Grandparents,Mother,Father,Siblings, Hypertension - Mother,Father,Siblings, Stroke - Paternal Grandparents, No family history of Hereditary Spherocytosis, Kidney Disease, Lung Disease, Seizures, Thyroid Problems, Tuberculosis. Social History Never smoker, Marital Status - Married, Alcohol Use - Moderate, Drug Use - No History, Caffeine  Use - Moderate. Medical History Eyes Denies history of Cataracts, Optic  Neuritis Ear/Nose/Mouth/Throat Denies history of Chronic sinus problems/congestion Hematologic/Lymphatic Denies history of Anemia, Hemophilia, Human Immunodeficiency Virus, Lymphedema, Sickle Cell Disease Respiratory Patient has history of Asthma, Sleep Apnea Denies history of Aspiration, Chronic Obstructive Pulmonary Disease (COPD), Pneumothorax, Tuberculosis Cardiovascular Patient has history of Hypertension [name], [name] ([id number]) [id number] Page 8 of 11 Denies history of Angina, Arrhythmia, Congestive Heart Failure, Coronary Artery Disease, Deep Vein Thrombosis, Hypotension, Myocardial Infarction, Peripheral Arterial Disease, Peripheral Venous Disease, Phlebitis, Vasculitis Gastrointestinal Patient has history of Colitis Denies history of Cirrhosis , Crohns, Hepatitis A, Hepatitis B, Hepatitis C Endocrine Denies history of Type I Diabetes, Type II Diabetes Immunological Denies history of Lupus Erythematosus, Raynauds, Scleroderma Integumentary (Skin) Denies history of History of Burn Musculoskeletal Patient has history of Rheumatoid Arthritis, Osteoarthritis Denies history of Gout, Osteomyelitis Neurologic Denies history of Dementia, Neuropathy, Quadriplegia, Paraplegia, Seizure Disorder Oncologic Denies history of Received Chemotherapy, Received Radiation Psychiatric Denies history of Anorexia/bulimia, Confinement Anxiety Hospitalization/Surgery History - surgery toe. - right hip replacement 12/2018. - heart cath 12/17/2021. Medical A Surgical History Notes nd Respiratory 4L [place] 02 pulmonary fibrosis Objective Constitutional respirations regular, non-labored and within target range for patient.. Vitals Time Taken: 12:45 PM, Height: 61 in, Weight: 186 lbs, BMI: 35.1, Temperature: 98.4 F, Pulse: 94 bpm, Respiratory Rate: 20 breaths/min, Blood Pressure: 127/68 mmHg. General Notes: per patient unable to eat stomach pain with nausea, per patient  PCP added augmentin for 10 days yesterday. Cardiovascular 2+ dorsalis pedis/posterior tibialis pulses. Psychiatric pleasant and cooperative. General Notes: T the right lower extremity there is a small open wound with granulation tissue at the base. No signs of surrounding infection. Good edema o control. Integumentary (Hair, Skin) Wound #5 status is Open. Original cause of wound was Gradually Appeared. The date acquired was: 04/19/2022. The wound has been in treatment 30 weeks. The wound is located on the [facility] Lower Leg. The wound measures 0.3cm length x 0.1cm width x 0.1cm depth; 0.024cm^2 area and 0.002cm^3 volume. There is no tunneling or undermining noted. There is a none present amount of drainage noted. The wound margin is distinct with the outline attached to the wound base. There is no granulation within the wound bed. There is no necrotic tissue within the wound bed. The periwound skin appearance did not exhibit: Callus, Crepitus, Excoriation, Induration, Rash, Scarring, Dry/Scaly, Maceration, Atrophie Blanche, Cyanosis, Ecchymosis, Hemosiderin Staining, Mottled, Pallor, Rubor, Erythema. Periwound temperature was noted as No Abnormality. 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 Apligraf #4 was placed in standard fashion today. I recommended continuing Velcro compression wrap daily. Follow-up in 1 week. Procedures [name], [name] ([id number]) [id number] Page 9 of 11 Wound #5 Pre-procedure diagnosis of Wound #5 is a Cellulitis located on the Left,Distal,Anterior Lower Leg. A skin graft procedure using a bioengineered  skin substitute/cellular or tissue based product was performed by Hailey Corwin, DO with the following instrument(s): Forceps and Scissors. Apligraf was applied and secured with Steri-Strips and adaptic. 4 sq cm of product was utilized and 40 sq cm was wasted due to wound size. Post Application, no dressing was applied. A Time Out was conducted at 13:05, prior to the start of the procedure. The procedure was tolerated well with  a pain level of 0 throughout and a pain level of 0 following the procedure. Post procedure Diagnosis Wound #5: Same as Pre-Procedure . Plan Follow-up Appointments: Return Appointment in 1 week. - Dr. Mikey Bussing room 8 Thursday 1100 01/12/2023 Return Appointment in 2 weeks. - 01/20/2023 Friday 1100 Dr[address] Mikey Bussing room 8 Anesthetic: (In clinic) Topical Lidocaine 5% applied to wound bed Cellular or Tissue Based Products: Wound #5 Left,Distal,Anterior Lower Leg: Cellular or Tissue Based Product Type: - 08/18/2022 insurance ran organogenesis- 100% covered. 08/26/2022 #1 Puraply AM applied. 09/01/2022 #2 Puraply AM applied. 09/09/2022 #3 Puraply AM applied to both wounds. 09/15/2022 #4 Puraply AM applied to wound. 09/23/2022 #5 Puraply AM applied to wound. 09/30/22 # 6 PUraply AM applied to wound. 10/07/22 #7 Puraply AM applied to wound. 10/14/2022 #8 Puraply AM applied to wound. 10/20/22 #9 Puraply AM applied to wound 10/28/2022 #11 Puraply AM applied to wound. 11/03/2022 #12 Puraply AM applied to wound. 11/10/2022 #13 Puraply AM applied to wound. 11/17/2022 #14 Puraply AM applied to wound bed. 11/24/2022 #15 puraply AM applied to wound bed. 12/01/2022 #16 Puraply AM applied to wound. Plan for apligraf for next week. 12/08/2022 #17 Puraply AM applied to wound. Apligraf not delivered, ordered for next week 12/15/2022 Apligraf #1 applied to wound bed. 12/22/2022 Apligraf #2 applied to wound bed. 12/29/2022 apligraf #3 applied to wound bed. 01/05/2023 #4 apligraf applied to wound bed. Cellular or Tissue Based  Product applied to wound bed, secured with steri-strips, cover with Adaptic or Mepitel. (DO NOT REMOVE). Bathing/ Shower/ Hygiene: May shower with protection but do not get wound dressing(s) wet. Protect dressing(s) with water repellant cover (for example, large plastic bag) or a cast cover and may then take shower. Edema Control - Lymphedema / SCD / Other: Elevate legs to the level of the heart or above for 30 minutes daily and/or when sitting for 3-4 times a day throughout the day. Avoid standing for long periods of time. Exercise regularly Compression stocking or Garment 20-30 mm/Hg pressure to: - start wearing the juxtalite HD apply in the morning and remove at night left leg. Non Wound Condition: Protect area with: - buttock area with AandD ointment daily WOUND #5: - Lower Leg Wound Laterality: Left, Anterior, Distal Cleanser: Wound Cleanser (Generic) 1 x Per Week/30 Days Discharge Instructions: Cleanse the wound with wound cleanser prior to applying a clean dressing using gauze sponges, not tissue or cotton balls. Peri-Wound Care: Skin Prep (Generic) 1 x Per Week/30 Days Discharge Instructions: Use skin prep as directed Prim Dressing: Apligraf 1 x Per Week/30 Days ary Discharge Instructions: applied by provider. Prim Dressing: Adaptic and steri-strips 1 x Per Week/30 Days ary Discharge Instructions: Used to secure Puraply AM. Leave in place. Do not remove. Secondary Dressing: Zetuvit Plus Silicone Border Dressing 4x4 (in/in) (Generic) 1 x Per Week/30 Days Discharge Instructions: Apply silicone border over primary dressing as directed. 1. Apligraf placed in standard fashion 2. Velcro compression wrap daily 3. Follow-up in 1 week Electronic Signature(s) Signed: 01/05/2023 4:07:01 PM By: Hailey Corwin DO Entered By: [name] on 01/05/2023 13:20:53 -------------------------------------------------------------------------------- HxROS Details Patient Name: Date of  Service: Hailey Diamond, DO [name] 01/05/2023 12:30 PM Medical Record Number: [id number] Patient Account Number: [id number] Date of Birth/Sex: Treating RN: 04-13-1946 (77 y.o. F) Primary Care Provider: Burton [name] Other Clinician: Referring Provider: Treating Provider/Extender: [name] in Treatment: 30 Information Obtained From Patient [name], [name] ([id number]) [id number] Page 10 of 11 Eyes Medical History: Negative for:  Cataracts; Optic Neuritis Ear/Nose/Mouth/Throat Medical History: Negative for: Chronic sinus problems/congestion Hematologic/Lymphatic Medical History: Negative for: Anemia; Hemophilia; Human Immunodeficiency Virus; Lymphedema; Sickle Cell Disease Respiratory Medical History: Positive for: Asthma; Sleep Apnea Negative for: Aspiration; Chronic Obstructive Pulmonary Disease (COPD); Pneumothorax; Tuberculosis Past Medical History Notes: 4L [place] 02 pulmonary fibrosis Cardiovascular Medical History: Positive for: Hypertension Negative for: Angina; Arrhythmia; Congestive Heart Failure; Coronary Artery Disease; Deep Vein Thrombosis; Hypotension; Myocardial Infarction; Peripheral Arterial Disease; Peripheral Venous Disease; Phlebitis; Vasculitis Gastrointestinal Medical History: Positive for: Colitis Negative for: Cirrhosis ; Crohns; Hepatitis A; Hepatitis B; Hepatitis C Endocrine Medical History: Negative for: Type I Diabetes; Type II Diabetes Immunological Medical History: Negative for: Lupus Erythematosus; Raynauds; Scleroderma Integumentary (Skin) Medical History: Negative for: History of Burn Musculoskeletal Medical History: Positive for: Rheumatoid Arthritis; Osteoarthritis Negative for: Gout; Osteomyelitis Neurologic Medical History: Negative for: Dementia; Neuropathy; Quadriplegia; Paraplegia; Seizure Disorder Oncologic Medical History: Negative for: Received Chemotherapy; Received  Radiation Psychiatric Medical History: Negative for: Anorexia/bulimia; Confinement Anxiety Immunizations Pneumococcal Vaccine: Received Pneumococcal Vaccination: Yes Received Pneumococcal Vaccination On or After [address]LAKIYA, COTTAM ([id number]) [id number] Page 11 of 11 Implantable Devices No devices added Hospitalization / Surgery History Type of Hospitalization/Surgery surgery toe right hip replacement 12/2018 heart cath 12/17/2021 Family and Social History Cancer: Yes - Maternal Grandparents; Diabetes: Yes - Siblings; Heart Disease: Yes - Maternal Grandparents,Paternal Grandparents,Mother,Father,Siblings; Hereditary Spherocytosis: No; Hypertension: Yes - Mother,Father,Siblings; Kidney Disease: No; Lung Disease: No; Seizures: No; Stroke: Yes - Paternal Grandparents; Thyroid Problems: No; Tuberculosis: No; Never smoker; Marital Status - Married; Alcohol Use: Moderate; Drug Use: No History; Caffeine Use: Moderate; Financial Concerns: No; Food, Clothing or Shelter Needs: No; Support System Lacking: No; Transportation Concerns: No Electronic Signature(s) Signed: 01/05/2023 4:07:01 PM By: Hailey Corwin DO Entered By: [name] on 01/05/2023 13:19:34 -------------------------------------------------------------------------------- SuperBill Details Patient Name: Date of Service: [name], DO [name] 01/05/2023 Medical Record Number: [id number] Patient Account Number: [id number] Date of Birth/Sex: Treating RN: [birth year] (77 y.o. [name], [name] Primary Care Provider: Burton [name] Other Clinician: Referring Provider: Treating Provider/Extender: [name] in Treatment: 30 Diagnosis Coding ICD-10 Codes Code Description [phone] Non-pressure chronic ulcer of other part of left lower leg with other specified severity I87.312 Chronic venous hypertension (idiopathic) with ulcer of left lower extremity J84.9  Interstitial pulmonary disease, unspecified M06.9 Rheumatoid arthritis, unspecified Facility Procedures : CPT4 Code: 69629528 Description: (Facility Use Only) Apligraf 1 SQ CM Modifier: Quantity: 44 : CPT4 Code: 41324401 Description: 15271 - SKIN SUB GRAFT TRNK/ARM/LEG ICD-10 Diagnosis Description L97.828 Non-pressure chronic ulcer of other part of left lower leg with other specified s I87.312 Chronic venous hypertension (idiopathic) with ulcer of left lower extremity Modifier: everity Quantity: 1 Physician Procedures : CPT4 Code Description Modifier [id number] - WC PHYS SKIN SUB GRAFT TRNK/ARM/LEG ICD-10 Diagnosis Description L97.828 Non-pressure chronic ulcer of other part of left lower leg with other specified severity I87.312 Chronic venous hypertension  (idiopathic) with ulcer of left lower extremity Quantity: 1 Electronic Signature(s) Signed: 01/05/2023 4:07:01 PM By: Hailey Corwin DO Entered By: [name] on 01/05/2023 13:21:25

## 2023-01-12 ENCOUNTER — Encounter (HOSPITAL_BASED_OUTPATIENT_CLINIC_OR_DEPARTMENT_OTHER): Payer: Medicare Other | Admitting: Internal Medicine

## 2023-01-12 DIAGNOSIS — L97828 Non-pressure chronic ulcer of other part of left lower leg with other specified severity: Secondary | ICD-10-CM

## 2023-01-12 DIAGNOSIS — I87312 Chronic venous hypertension (idiopathic) with ulcer of left lower extremity: Secondary | ICD-10-CM | POA: Diagnosis not present

## 2023-01-12 DIAGNOSIS — L97829 Non-pressure chronic ulcer of other part of left lower leg with unspecified severity: Secondary | ICD-10-CM | POA: Diagnosis not present

## 2023-01-12 NOTE — Progress Notes (Addendum)
[name], [name] ([id number]) [id number] Page 1 of 11 Visit Report for 01/12/2023 Chief Complaint Document Details Patient Name: Date of Service: Hailey Diamond, DO Hailey THY S. 01/12/2023 11:00 A M Medical Record Number: [id number] Patient Account Number: [id number] Date of Birth/Sex: Treating RN: 1945-08-13 (77 y.o. F) Primary Care Provider: Burton Apley Other Clinician: Referring Provider: Treating Provider/Extender: [name] in Treatment: 31 Information Obtained from: Patient Chief Complaint 06/03/2022; Left lower extremity wounds Electronic Signature(s) Signed: 01/12/2023 4:14:51 PM By: Hailey Corwin DO Entered By: [name] on 01/12/2023 12:23:58 -------------------------------------------------------------------------------- Cellular or Tissue Based Product Details Patient Name: Date of Service: Hailey Diamond, DO Hailey THY S. 01/12/2023 11:00 A M Medical Record Number: [id number] Patient Account Number: [id number] Date of Birth/Sex: Treating RN: 1945-12-22 (77 y.o. [name] Primary Care Provider: Burton Apley Other Clinician: Referring Provider: Treating Provider/Extender: [name] in Treatment: 31 Cellular or Tissue Based Product Type Wound #5 Left,Distal,Anterior Lower Leg Applied to: Performed By: Physician Hailey Corwin, DO Cellular or Tissue Based Product Type: Apligraf Level of Consciousness (Pre-procedure): Awake and Alert Pre-procedure Verification/Time Out Yes - 11:49 Taken: Location: trunk / arms / legs Wound Size (sq cm): 0.02 Product Size (sq cm): 44 Waste Size (sq cm): 34 Waste Reason: Smaller wound Amount of Product Applied (sq cm): 10 Instrument Used: Blade, Forceps, Scissors Lot #: [id number] Expiration Date: 01/18/2023 Fenestrated: Yes Instrument: Blade Secured: Yes Secured With: Steri-Strips Procedural Pain: 0 Post Procedural Pain: 0 Response to  Treatment: Procedure was tolerated well Level of Consciousness (Post- Awake and Alert procedure): Post Procedure Diagnosis Same as Pre-procedure [name] ([id number]) [id number] Page 2 of 11 Electronic Signature(s) Signed: 01/12/2023 3:42:43 PM By: Midge Aver MSN RN CNS WTA Signed: 01/12/2023 4:14:51 PM By: Hailey Corwin DO Entered By: Midge Aver on 01/12/2023 15:42:43 -------------------------------------------------------------------------------- HPI Details Patient Name: Date of Service: Hailey Diamond, DO Hailey THY S. 01/12/2023 11:00 A M Medical Record Number: [id number] Patient Account Number: [id number] Date of Birth/Sex: Treating RN: 03/30/1946 (77 y.o. F) Primary Care Provider: Burton Apley Other Clinician: Referring Provider: Treating Provider/Extender: [name] in Treatment: 31 History of Present Illness HPI Description: Patient presents today for initial evaluation and our clinic as a referral from the [facility] health system Department of orthopedics for evaluation and treatment of the wound to his his of the left foot at the base of the great toe. Currently the good news is the patient really does not have any significant pain which is excellent. She has been seen by Dr. Luciana Axe at [facility]: infectious disease clinic that is the regional Center for infectious disease. Subsequently the patient was cultured and positive for methicillin sensitive staph aureus. She has been on antibiotics including Cipro prior to surgery as will several days following surgery. She then had clindamycin which was changed to doxycycline. She took this for 10 days. Subsequently the pain had improved and there was no longer any possible draining from the wound according to the notes. The patient sedentary rate as well as C-reactive protein have returned to normal ranges. Currently per Dr. Ephriam Knuckles assessment there was one dehiscence with no  sign of osteomyelitis. He therefore discontinue the antibiotics with the completion of the last three days Of doxycycline. Other than that he just set her for a follow-up as needed. The patient does not have diabetes and is not a current smoker. At this time her treatment that was recommended by the surgeon was applying  a Hydrocolloid dressing. Based on what I'm seeing at this point the patient actually has some Slough noted over the surface of the wound there really does not appear to be evidence of infection though I do think she does require some sharp debridement to clear away the slough and help with appropriate wound healing. She does have areas of granulation buds noted. She may be a candidate for a skin substitute as well. We will see how things do over the next period of time until follow-up. No fevers, chills, nausea, or vomiting noted at this time. 06/13/18 on evaluation today patient actually appears to be doing better in regard to her toe ulcer. She's been using the Prisma on this region and that seems to have done extremely well for her. Fortunately there does not appear to be evidence of infection at this time which is good news. She did see her surgeon they were extremely pleased with the overall appearance of her wound. 06/27/18 on evaluation today patient actually appears to be doing very well in regard to her foot ulcer. She has been tolerating the dressing changes without complication which is great news. She did see her podiatrist they felt like everything is looking very nice as well and will please. 07/05/2018 surgical wound on the left foot. She appears to be doing well. Still surface debrided to remove however in general post debridement the wound bed looks quite healthy it is come down significantly in terms of dimensions using silver collagen. The patient describes pain when her foot is elevated either in bed at night [sometimes keeps her awake] or when is propped up on a  foot rest. She basically takes analgesics for this. She does not really describe claudication with activity however her activity is very limited by interstitial lung disease. Her ABIs initially in this clinic were noncompressible. She is not a diabetic 07/20/2018; surgical wound on the left foot. Wound actually looks quite a bit better than I remember seeing this. She is still describing pain with her leg elevated at night that she does not get when the wound is supine. She does not really describe claudication but she is very limited by her pulmonary status. We have been using silver collagen. 1/17; the patient has been followed up by orthopedics and discharge. Her arterial studies were actually quite good and should not be contributing to any pain. Slight reduction in ABIs but otherwise normal. Her wound is closed. She saw Dr. Luciana Axe of infectious disease surrounding the surgery. By review of our notes she was not felt to have osteomyelitis. 06/03/2022 [name] is a 77 year old female with a past medical history of interstitial lung disease on chronic oxygen via nasal cannula, rheumatoid arthritis, chronic diastolic heart failure and OSA that presents to the clinic for a 1 to 5-month history of nonhealing ulcer to the left lower extremity. On 04/20/2022 she was admitted to the hospital for left lower extremity cellulitis. She required 8 days of IV vancomycin and Unasyn followed by a 14-day course of Augmentin and doxycycline. She has been using Silvadene cream to the area. She does not use compression therapy but does own compression stockings. She currently denies signs of infection. 11/27; patient presents for follow-up she is using Medihoney and Hydrofera Blue to the wound beds. She has been using her Tubigrip. She has no issues or complaints today. 12/4; patient presents for follow-up. She continues to use Medihoney and Hydrofera Blue to the wound beds. Several attempts have been made to  schedule ABIs with TBI's. We gave patient the number today to call to have these scheduled. She has been using Tubigrip. She has no issues or complaints today. 12/18; she continues to use Medihoney and Hydrofera Blue in general the surface of the wound is looking somewhat better this week and measurements are slightly smaller. She did have her arterial studies done these were noncompressible bilaterally however TBI on the right of 0.69 on the left 0.62. Waveforms on the right were triphasic on the left biphasic to triphasic. This does not suggest significant arterial disease to make healing wounds at this location difficult. 12/29; patient presents for follow-up. She has been using Medihoney and Hydrofera Blue to the wound bed. She has been using Tubigrip. 1/5; patient presents for follow-up. She is been using Medihoney and Hydrofera Blue to the wound bed along with Tubigrip. The Tubigrip was doubled at last clinic visit. There is been improvement in wound healing. 1/12; patient presents for follow-up. She has been using Medihoney and Hydrofera Blue to the wound bed along with Tubigrip. There continues to be improvement in wound healing. 1/19; patient presents for follow-up. She has been using Medihoney and Hydrofera Blue to the wound bed along with her juxta light compression daily. There is been improvement in wound measurements. Patient has no issues or complaints today. [name], [name] ([id number]) [id number] Page 3 of 11 1/25; patient presents for follow-up. She has been using blast X and collagen to the wound bed under juxta light compression daily. She has no issues or complaints today. There is been improvement in wound healing. 2/1; patient presents for follow-up. She has been using blast X and collagen to the wound bed under juxta light compression daily. She has no issues or complaints today. 2/9; patient presents for follow-up. She has been using Hydrofera  Blue with blast X under compression wrap daily. She has been approved for a skin substitute and patient would like to proceed with this. 2/15; patient presents for follow-up. PuraPly was placed in standard fashion to the large anterior leg wound. She has been using blast X and Hydrofera Blue to the more proximal small wound. She has been using her juxta light compression daily. She has no issues or complaints today. 2/23; patient presents for follow-up. PuraPly was placed in standard fashion to the large anterior leg wound. The more proximal small wound is almost healed and they have been using blast X and Hydrofera Blue. She has been using her juxta light compression daily to the left lower extremity. There is been improvement in wound healing. 2/29; patient presents for follow-up. PuraPly was placed in standard fashion to the large anterior leg wound. The more proximal wound has healed. She is been using her juxta light compression daily. She has no issues or complaints today. 3/8; patient presents for follow-up. PuraPly has been used to the wound bed weekly patient has done well with this. She has been using her juxta light compression daily. She has no issues or complaints today. 3/15; patient presents for follow-up. We have been using PuraPly to the wound bed weekly. She has been using her juxta light compression daily. She has been doing well with this treatment. Her wound has improved. She has no issues or complaints today. 3/22; patient presents for follow-up. We have been using PuraPly to the wound bed weekly. She has been using her juxta lite compression. Overall more granulation tissue present today. Patient has no issues or complaints today. 3/29; patient presents for follow-up. We  have been using PuraPly to the wound bed weekly. She has been using her juxta lite compression daily. The wound is smaller. 10/20/2022: The wound continues to contract. There is some slough accumulation in the  deepest portion of the wound. No concern for infection. 4/12; patient presents for follow-up. We have been placing PuraPly to the wound bed weekly. There has been improvement in wound healing. 4/18; patient presents for follow-up. We have been using PuraPly to the wound bed. There continues to be improvement in wound healing. She has no issues or complaints. 4/25; Patient presents for follow-up. We have been using PuraPly to the wound bed. The wound is smaller. 5/2; patient presents for follow-up. We have been using PuraPly. The wound is smaller. 5/9; patient presents for follow-up. We have been using PuraPly. Again wound is smaller. 5/16; patient presents for follow-up. We have been using PuraPly. Wound is stable. No signs of infection. She appears ready for Apligraf and this was discussed with the patient. We will proceed with this next week. 5/23; Patient presents for follow-up. Patient was to start Apligraf today however this was not delivered. We went ahead and replaced PuraPly. Overall wound appears well-healing. 5/30; patient presents for follow-up. We have been using PuraPly. Apligraf was available and this was placed in office today. Wound is smaller. 6/6; Apligraf at #2 applied today. Wound is improved surface area is better 6/13; patient presents for follow-up. Apligraf #3 was applied today. Wound is smaller. 6/20; patient presents for follow up. Apligraf #4 was applied today. Wound is smaller. 6/27; patient presents for follow-up. Apligraf #5 was applied today. Wound is almost healed. Electronic Signature(s) Signed: 01/12/2023 4:14:51 PM By: Hailey Corwin DO Entered By: [name] on 01/12/2023 12:24:25 -------------------------------------------------------------------------------- Physical Exam Details Patient Name: Date of Service: Hailey Diamond, DO Hailey THY S. 01/12/2023 11:00 A M Medical Record Number: [id number] Patient Account Number: [id number] Date of Birth/Sex: Treating  RN: 09/13/1945 (77 y.o. F) Primary Care Provider: Burton Apley Other Clinician: Referring Provider: Treating Provider/Extender: [name] in Treatment: 31 Constitutional respirations regular, non-labored and within target range for patient.. Cardiovascular [name], [name] ([id number]) [id number] Page 4 of 11 2+ dorsalis pedis/posterior tibialis pulses. Psychiatric pleasant and cooperative. Notes T the right lower extremity there is a small open wound with granulation tissue at the base. No signs of surrounding infection. Good edema control. o Electronic Signature(s) Signed: 01/12/2023 4:14:51 PM By: Hailey Corwin DO Entered By: [name] on 01/12/2023 12:24:48 -------------------------------------------------------------------------------- Physician Orders Details Patient Name: Date of Service: Hailey Diamond, DO Hailey THY S. 01/12/2023 11:00 A M Medical Record Number: [id number] Patient Account Number: [id number] Date of Birth/Sex: Treating RN: Feb 26, 1946 (77 y.o. [name] Primary Care Provider: Burton Apley Other Clinician: Referring Provider: Treating Provider/Extender: [name] in Treatment: 08 Verbal / Phone Orders: No Diagnosis Coding Follow-up Appointments ppointment in 1 week. - Dr. Mikey Bussing room 8 Thursday 1100 01/12/2023 Return A ppointment in 2 weeks. - 01/20/2023 Friday 1100 Dr. Mikey Bussing room 8 Return A Anesthetic (In clinic) Topical Lidocaine 5% applied to wound bed Cellular or Tissue Based Products Wound #5 Left,Distal,Anterior Lower Leg Cellular or Tissue Based Product Type: - 08/18/2022 insurance ran organogenesis- 100% covered. 08/26/2022 #1 Puraply AM applied. 09/01/2022 #2 Puraply AM applied. 09/09/2022 #3 Puraply AM applied to both wounds. 09/15/2022 #4 Puraply AM applied to wound. 09/23/2022 #5 Puraply AM applied to wound. 09/30/22 # 6 PUraply AM applied to  wound. 10/07/22 #7 Puraply AM  applied to wound. 10/14/2022 #8 Puraply AM applied to wound. 10/20/22 #9 Puraply AM applied to wound 10/28/2022 #11 Puraply AM applied to wound. 11/03/2022 #12 Puraply AM applied to wound. 11/10/2022 #13 Puraply AM applied to wound. 11/17/2022 #14 Puraply AM applied to wound bed. 11/24/2022 #15 puraply AM applied to wound bed. 12/01/2022 #16 Puraply AM applied to wound. Plan for apligraf for next week. 12/08/2022 #17 Puraply AM applied to wound. Apligraf not delivered, ordered for next week 12/15/2022 Apligraf #1 applied to wound bed. 12/22/2022 Apligraf #2 applied to wound bed. 12/29/2022 apligraf #3 applied to wound bed. 01/05/2023 #4 apligraf applied to wound bed. Cellular or Tissue Based Product applied to wound bed, secured with steri-strips, cover with Adaptic or Mepitel. (DO NOT REMOVE). Bathing/ Shower/ Hygiene May shower with protection but do not get wound dressing(s) wet. Protect dressing(s) with water repellant cover (for example, large plastic bag) or a cast cover and may then take shower. Edema Control - Lymphedema / SCD / Other Elevate legs to the level of the heart or above for 30 minutes daily and/or when sitting for 3-4 times a day throughout the day. Avoid standing for long periods of time. Exercise regularly Compression stocking or Garment 20-30 mm/Hg pressure to: - start wearing the juxtalite HD apply in the morning and remove at night left leg. Non Wound Condition Protect area with: - buttock area with AandD ointment daily Wound Treatment Wound #5 - Lower Leg Wound Laterality: Left, Anterior, Distal [name], [name] ([id number]) [id number] Page 5 of 11 Cleanser: Wound Cleanser (Generic) 1 x Per Week/30 Days Discharge Instructions: Cleanse the wound with wound cleanser prior to applying a clean dressing using gauze sponges, not tissue or cotton balls. Peri-Wound Care: Skin Prep (Generic) 1 x Per Week/30 Days Discharge  Instructions: Use skin prep as directed Prim Dressing: Apligraf 1 x Per Week/30 Days ary Discharge Instructions: applied by provider. Prim Dressing: Adaptic and steri-strips 1 x Per Week/30 Days ary Discharge Instructions: Used to secure Puraply AM. Leave in place. Do not remove. Secondary Dressing: Zetuvit Plus Silicone Border Dressing 4x4 (in/in) (Generic) 1 x Per Week/30 Days Discharge Instructions: Apply silicone border over primary dressing as directed. Electronic Signature(s) Signed: 01/12/2023 4:14:51 PM By: Hailey Corwin DO Entered By: [name] on 01/12/2023 12:24:55 -------------------------------------------------------------------------------- Problem List Details Patient Name: Date of Service: Hailey Diamond, DO Hailey THY S. 01/12/2023 11:00 A M Medical Record Number: [id number] Patient Account Number: [id number] Date of Birth/Sex: Treating RN: 1945-12-03 (77 y.o. F) Primary Care Provider: Burton Apley Other Clinician: Referring Provider: Treating Provider/Extender: [name] in Treatment: 31 Active Problems ICD-10 Encounter Code Description Active Date MDM Diagnosis L97.828 Non-pressure chronic ulcer of other part of left lower leg with other specified 06/03/2022 No Yes severity I87.312 Chronic venous hypertension (idiopathic) with ulcer of left lower extremity 06/03/2022 No Yes J84.9 Interstitial pulmonary disease, unspecified 06/03/2022 No Yes M06.9 Rheumatoid arthritis, unspecified 06/03/2022 No Yes Inactive Problems Resolved Problems Electronic Signature(s) Signed: 01/12/2023 4:14:51 PM By: Hailey Corwin DO Entered By: [name] on 01/12/2023 12:23:45 [name] ([id number]) [id number] Page 6 of 11 -------------------------------------------------------------------------------- Progress Note Details Patient Name: Date of Service: Hailey Diamond, DO Hailey THY S. 01/12/2023 11:00 A M Medical Record  Number: [id number] Patient Account Number: [id number] Date of Birth/Sex: Treating RN: 1946/07/10 (77 y.o. F) Primary Care Provider: Burton Apley Other Clinician: Referring Provider: Treating Provider/Extender: [name] in Treatment: 31 Subjective Chief Complaint Information obtained from Patient 06/03/2022;  Left lower extremity wounds History of Present Illness (HPI) Patient presents today for initial evaluation and our clinic as a referral from the [facility] health system Department of orthopedics for evaluation and treatment of the wound to his his of the left foot at the base of the great toe. Currently the good news is the patient really does not have any significant pain which is excellent. She has been seen by Dr. Luciana Axe at [facility]: infectious disease clinic that is the regional Center for infectious disease. Subsequently the patient was cultured and positive for methicillin sensitive staph aureus. She has been on antibiotics including Cipro prior to surgery as will several days following surgery. She then had clindamycin which was changed to doxycycline. She took this for 10 days. Subsequently the pain had improved and there was no longer any possible draining from the wound according to the notes. The patient sedentary rate as well as C-reactive protein have returned to normal ranges. Currently per Dr. Ephriam Knuckles assessment there was one dehiscence with no sign of osteomyelitis. He therefore discontinue the antibiotics with the completion of the last three days Of doxycycline. Other than that he just set her for a follow-up as needed. The patient does not have diabetes and is not a current smoker. At this time her treatment that was recommended by the surgeon was applying a Hydrocolloid dressing. Based on what I'm seeing at this point the patient actually has some Slough noted over the surface of the wound there really does not appear to be evidence of  infection though I do think she does require some sharp debridement to clear away the slough and help with appropriate wound healing. She does have areas of granulation buds noted. She may be a candidate for a skin substitute as well. We will see how things do over the next period of time until follow-up. No fevers, chills, nausea, or vomiting noted at this time. 06/13/18 on evaluation today patient actually appears to be doing better in regard to her toe ulcer. She's been using the Prisma on this region and that seems to have done extremely well for her. Fortunately there does not appear to be evidence of infection at this time which is good news. She did see her surgeon they were extremely pleased with the overall appearance of her wound. 06/27/18 on evaluation today patient actually appears to be doing very well in regard to her foot ulcer. She has been tolerating the dressing changes without complication which is great news. She did see her podiatrist they felt like everything is looking very nice as well and will please. 07/05/2018 surgical wound on the left foot. She appears to be doing well. Still surface debrided to remove however in general post debridement the wound bed looks quite healthy it is come down significantly in terms of dimensions using silver collagen. The patient describes pain when her foot is elevated either in bed at night [sometimes keeps her awake] or when is propped up on a foot rest. She basically takes analgesics for this. She does not really describe claudication with activity however her activity is very limited by interstitial lung disease. Her ABIs initially in this clinic were noncompressible. She is not a diabetic 07/20/2018; surgical wound on the left foot. Wound actually looks quite a bit better than I remember seeing this. She is still describing pain with her leg elevated at night that she does not get when the wound is supine. She does not really describe  claudication but  she is very limited by her pulmonary status. We have been using silver collagen. 1/17; the patient has been followed up by orthopedics and discharge. Her arterial studies were actually quite good and should not be contributing to any pain. Slight reduction in ABIs but otherwise normal. Her wound is closed. She saw Dr. Luciana Axe of infectious disease surrounding the surgery. By review of our notes she was not felt to have osteomyelitis. 06/03/2022 Ms. Alivea Archuleta is a 77 year old female with a past medical history of interstitial lung disease on chronic oxygen via nasal cannula, rheumatoid arthritis, chronic diastolic heart failure and OSA that presents to the clinic for a 1 to 39-month history of nonhealing ulcer to the left lower extremity. On 04/20/2022 she was admitted to the hospital for left lower extremity cellulitis. She required 8 days of IV vancomycin and Unasyn followed by a 14-day course of Augmentin and doxycycline. She has been using Silvadene cream to the area. She does not use compression therapy but does own compression stockings. She currently denies signs of infection. 11/27; patient presents for follow-up she is using Medihoney and Hydrofera Blue to the wound beds. She has been using her Tubigrip. She has no issues or complaints today. 12/4; patient presents for follow-up. She continues to use Medihoney and Hydrofera Blue to the wound beds. Several attempts have been made to schedule ABIs with TBI's. We gave patient the number today to call to have these scheduled. She has been using Tubigrip. She has no issues or complaints today. 12/18; she continues to use Medihoney and Hydrofera Blue in general the surface of the wound is looking somewhat better this week and measurements are slightly smaller. She did have her arterial studies done these were noncompressible bilaterally however TBI on the right of 0.69 on the left 0.62. Waveforms on the right were triphasic on the  left biphasic to triphasic. This does not suggest significant arterial disease to make healing wounds at this location difficult. 12/29; patient presents for follow-up. She has been using Medihoney and Hydrofera Blue to the wound bed. She has been using Tubigrip. 1/5; patient presents for follow-up. She is been using Medihoney and Hydrofera Blue to the wound bed along with Tubigrip. The Tubigrip was doubled at last clinic visit. There is been improvement in wound healing. 1/12; patient presents for follow-up. She has been using Medihoney and Hydrofera Blue to the wound bed along with Tubigrip. There continues to be improvement in wound healing. 1/19; patient presents for follow-up. She has been using Medihoney and Hydrofera Blue to the wound bed along with her juxta light compression daily. There is been improvement in wound measurements. Patient has no issues or complaints today. 1/25; patient presents for follow-up. She has been using blast X and collagen to the wound bed under juxta light compression daily. She has no issues or complaints today. There is been improvement in wound healing. 2/1; patient presents for follow-up. She has been using blast X and collagen to the wound bed under juxta light compression daily. She has no issues or [name], [name] ([id number]) [id number] Page 7 of 11 complaints today. 2/9; patient presents for follow-up. She has been using Hydrofera Blue with blast X under compression wrap daily. She has been approved for a skin substitute and patient would like to proceed with this. 2/15; patient presents for follow-up. PuraPly was placed in standard fashion to the large anterior leg wound. She has been using blast X and Hydrofera Blue to the more proximal small wound.  She has been using her juxta light compression daily. She has no issues or complaints today. 2/23; patient presents for follow-up. PuraPly was placed in standard fashion to the  large anterior leg wound. The more proximal small wound is almost healed and they have been using blast X and Hydrofera Blue. She has been using her juxta light compression daily to the left lower extremity. There is been improvement in wound healing. 2/29; patient presents for follow-up. PuraPly was placed in standard fashion to the large anterior leg wound. The more proximal wound has healed. She is been using her juxta light compression daily. She has no issues or complaints today. 3/8; patient presents for follow-up. PuraPly has been used to the wound bed weekly patient has done well with this. She has been using her juxta light compression daily. She has no issues or complaints today. 3/15; patient presents for follow-up. We have been using PuraPly to the wound bed weekly. She has been using her juxta light compression daily. She has been doing well with this treatment. Her wound has improved. She has no issues or complaints today. 3/22; patient presents for follow-up. We have been using PuraPly to the wound bed weekly. She has been using her juxta lite compression. Overall more granulation tissue present today. Patient has no issues or complaints today. 3/29; patient presents for follow-up. We have been using PuraPly to the wound bed weekly. She has been using her juxta lite compression daily. The wound is smaller. 10/20/2022: The wound continues to contract. There is some slough accumulation in the deepest portion of the wound. No concern for infection. 4/12; patient presents for follow-up. We have been placing PuraPly to the wound bed weekly. There has been improvement in wound healing. 4/18; patient presents for follow-up. We have been using PuraPly to the wound bed. There continues to be improvement in wound healing. She has no issues or complaints. 4/25; Patient presents for follow-up. We have been using PuraPly to the wound bed. The wound is smaller. 5/2; patient presents for follow-up.  We have been using PuraPly. The wound is smaller. 5/9; patient presents for follow-up. We have been using PuraPly. Again wound is smaller. 5/16; patient presents for follow-up. We have been using PuraPly. Wound is stable. No signs of infection. She appears ready for Apligraf and this was discussed with the patient. We will proceed with this next week. 5/23; Patient presents for follow-up. Patient was to start Apligraf today however this was not delivered. We went ahead and replaced PuraPly. Overall wound appears well-healing. 5/30; patient presents for follow-up. We have been using PuraPly. Apligraf was available and this was placed in office today. Wound is smaller. 6/6; Apligraf at #2 applied today. Wound is improved surface area is better 6/13; patient presents for follow-up. Apligraf #3 was applied today. Wound is smaller. 6/20; patient presents for follow up. Apligraf #4 was applied today. Wound is smaller. 6/27; patient presents for follow-up. Apligraf #5 was applied today. Wound is almost healed. Patient History Information obtained from Patient. Family History Cancer - Maternal Grandparents, Diabetes - Siblings, Heart Disease - Maternal Grandparents,Paternal Grandparents,Mother,Father,Siblings, Hypertension - Mother,Father,Siblings, Stroke - Paternal Grandparents, No family history of Hereditary Spherocytosis, Kidney Disease, Lung Disease, Seizures, Thyroid Problems, Tuberculosis. Social History Never smoker, Marital Status - Married, Alcohol Use - Moderate, Drug Use - No History, Caffeine Use - Moderate. Medical History Eyes Denies history of Cataracts, Optic Neuritis Ear/Nose/Mouth/Throat Denies history of Chronic sinus problems/congestion Hematologic/Lymphatic Denies history of Anemia, Hemophilia, Human Immunodeficiency  Virus, Lymphedema, Sickle Cell Disease Respiratory Patient has history of Asthma, Sleep Apnea Denies history of Aspiration, Chronic Obstructive Pulmonary  Disease (COPD), Pneumothorax, Tuberculosis Cardiovascular Patient has history of Hypertension Denies history of Angina, Arrhythmia, Congestive Heart Failure, Coronary Artery Disease, Deep Vein Thrombosis, Hypotension, Myocardial Infarction, Peripheral Arterial Disease, Peripheral Venous Disease, Phlebitis, Vasculitis Gastrointestinal Patient has history of Colitis Denies history of Cirrhosis , Crohns, Hepatitis A, Hepatitis B, Hepatitis C Endocrine Denies history of Type I Diabetes, Type II Diabetes Immunological Denies history of Lupus Erythematosus, Raynauds, Scleroderma Integumentary (Skin) Denies history of History of Burn Musculoskeletal [name], [name] ([id number]) [id number] Page 8 of 11 Patient has history of Rheumatoid Arthritis, Osteoarthritis Denies history of Gout, Osteomyelitis Neurologic Denies history of Dementia, Neuropathy, Quadriplegia, Paraplegia, Seizure Disorder Oncologic Denies history of Received Chemotherapy, Received Radiation Psychiatric Denies history of Anorexia/bulimia, Confinement Anxiety Hospitalization/Surgery History - surgery toe. - right hip replacement 12/2018. - heart cath 12/17/2021. Medical A Surgical History Notes nd Respiratory 4L [place] 02 pulmonary fibrosis Objective Constitutional respirations regular, non-labored and within target range for patient.. Vitals Time Taken: 11:28 AM, Height: 61 in, Weight: 186 lbs, BMI: 35.1, Temperature: 97.9 F, Pulse: 98 bpm, Respiratory Rate: 20 breaths/min, Blood Pressure: 149/83 mmHg. Cardiovascular 2+ dorsalis pedis/posterior tibialis pulses. Psychiatric pleasant and cooperative. General Notes: T the right lower extremity there is a small open wound with granulation tissue at the base. No signs of surrounding infection. Good edema o control. Integumentary (Hair, Skin) Wound #5 status is Open. Original cause of wound was Gradually Appeared. The date acquired was: 04/19/2022.  The wound has been in treatment 31 weeks. The wound is located on the [facility] Lower Leg. The wound measures 0.2cm length x 0.1cm width x 0.1cm depth; 0.016cm^2 area and 0.002cm^3 volume. There is no tunneling or undermining noted. There is a none present amount of drainage noted. The wound margin is distinct with the outline attached to the wound base. There is no granulation within the wound bed. There is no necrotic tissue within the wound bed. The periwound skin appearance exhibited: Scarring. The periwound skin appearance did not exhibit: Callus, Crepitus, Excoriation, Induration, Rash, Dry/Scaly, Maceration, Atrophie Blanche, Cyanosis, Ecchymosis, Hemosiderin Staining, Mottled, Pallor, Rubor, Erythema. Periwound temperature was noted as No Abnormality. 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 It is almost healed. Apligraf #5 was placed in standard fashion today. Continue compression wrap daily. I am hopeful this will be healed at next clinic visit. Procedures Wound #5 Pre-procedure diagnosis of Wound #5 is a Cellulitis located on the Left,Distal,Anterior Lower Leg. A skin graft procedure using a bioengineered skin substitute/cellular or tissue based product was performed by Hailey Corwin, DO with the following instrument(s): Blade, Forceps, and Scissors. Apligraf was applied and secured with Steri-Strips. 10 sq cm of product was utilized and 34 sq cm was wasted due to Smaller wound. A Time Out was conducted at 11:49, prior to the start of the procedure. The procedure was tolerated well with a pain level of 0 throughout and a pain level of 0 following the procedure. Post procedure Diagnosis Wound #5:  Same as Pre-Procedure . [address] [name], [name] ([id number]) [id number] Page 9 of 11 Follow-up Appointments: Return Appointment in 1 week. - Dr. Mikey Bussing room 8 Thursday 1100 01/12/2023 Return Appointment in 2 weeks. - 01/20/2023  Friday 1100 Dr. Mikey Bussing room 8 Anesthetic: (In clinic) Topical Lidocaine 5% applied to wound bed Cellular or Tissue Based Products: Wound #5 Left,Distal,Anterior Lower Leg: Cellular or Tissue Based Product Type: - 08/18/2022 insurance ran organogenesis- 100% covered. 08/26/2022 #1 Puraply AM applied. 09/01/2022 #2 Puraply AM applied. 09/09/2022 #3 Puraply AM applied to both wounds. 09/15/2022 #4 Puraply AM applied to wound. 09/23/2022 #5 Puraply AM applied to wound. 09/30/22 # 6 PUraply AM applied to wound. 10/07/22 #7 Puraply AM applied to wound. 10/14/2022 #8 Puraply AM applied to wound. 10/20/22 #9 Puraply AM applied to wound 10/28/2022 #11 Puraply AM applied to wound. 11/03/2022 #12 Puraply AM applied to wound. 11/10/2022 #13 Puraply AM applied to wound. 11/17/2022 #14 Puraply AM applied to wound bed. 11/24/2022 #15 puraply AM applied to wound bed. 12/01/2022 #16 Puraply AM applied to wound. Plan for apligraf for next week. 12/08/2022 #17 Puraply AM applied to wound. Apligraf not delivered, ordered for next week 12/15/2022 Apligraf #1 applied to wound bed. 12/22/2022 Apligraf #2 applied to wound bed. 12/29/2022 apligraf #3 applied to wound bed. 01/05/2023 #4 apligraf applied to wound bed. Cellular or Tissue Based Product applied to wound bed, secured with steri-strips, cover with Adaptic or Mepitel. (DO NOT REMOVE). Bathing/ Shower/ Hygiene: May shower with protection but do not get wound dressing(s) wet. Protect dressing(s) with water repellant cover (for example, large plastic bag) or a cast cover and may then take shower. Edema Control - Lymphedema / SCD / Other: Elevate legs to the level of the heart or above for 30 minutes daily and/or when sitting for 3-4 times a  day throughout the day. Avoid standing for long periods of time. Exercise regularly Compression stocking or Garment 20-30 mm/Hg pressure to: - start wearing the juxtalite HD apply in the morning and remove at night left leg. Non Wound Condition: Protect area with: - buttock area with AandD ointment daily WOUND #5: - Lower Leg Wound Laterality: Left, Anterior, Distal Cleanser: Wound Cleanser (Generic) 1 x Per Week/30 Days Discharge Instructions: Cleanse the wound with wound cleanser prior to applying a clean dressing using gauze sponges, not tissue or cotton balls. Peri-Wound Care: Skin Prep (Generic) 1 x Per Week/30 Days Discharge Instructions: Use skin prep as directed Prim Dressing: Apligraf 1 x Per Week/30 Days ary Discharge Instructions: applied by provider. Prim Dressing: Adaptic and steri-strips 1 x Per Week/30 Days ary Discharge Instructions: Used to secure Puraply AM. Leave in place. Do not remove. Secondary Dressing: Zetuvit Plus Silicone Border Dressing 4x4 (in/in) (Generic) 1 x Per Week/30 Days Discharge Instructions: Apply silicone border over primary dressing as directed. 1. Apligraf #5 placed in standard fashion 2. Follow-up in 1 week Electronic Signature(s) Signed: 01/13/2023 2:14:44 PM By: Shawn Stall RN, BSN Signed: 01/16/2023 10:26:47 AM By: Hailey Corwin DO Previous Signature: 01/12/2023 4:14:51 PM Version By: Hailey Corwin DO Entered By: Shawn Stall on 01/13/2023 14:05:17 -------------------------------------------------------------------------------- HxROS Details Patient Name: Date of Service: Hailey Diamond, DO Hailey THY S. 01/12/2023 11:00 A M Medical Record Number: [id number] Patient Account Number: [id number] Date of Birth/Sex: Treating RN: [birth year] (77 y.o. F) Primary Care Provider: Burton Apley Other Clinician: Referring Provider: Treating Provider/Extender: [name] in Treatment: 31 Information Obtained  From Patient Eyes Medical History: Negative for: Cataracts; Optic Neuritis Ear/Nose/Mouth/Throat Medical History: Negative for: Chronic sinus problems/congestion Hematologic/Lymphatic Medical HistoryNAYANI, [name] ([id number]) [id number] Page 10 of 11 Negative for: Anemia; Hemophilia; Human Immunodeficiency Virus; Lymphedema; Sickle Cell Disease Respiratory Medical History: Positive for: Asthma;  Sleep Apnea Negative for: Aspiration; Chronic Obstructive Pulmonary Disease (COPD); Pneumothorax; Tuberculosis Past Medical History Notes: 4L [place] 02 pulmonary fibrosis Cardiovascular Medical History: Positive for: Hypertension Negative for: Angina; Arrhythmia; Congestive Heart Failure; Coronary Artery Disease; Deep Vein Thrombosis; Hypotension; Myocardial Infarction; Peripheral Arterial Disease; Peripheral Venous Disease; Phlebitis; Vasculitis Gastrointestinal Medical History: Positive for: Colitis Negative for: Cirrhosis ; Crohns; Hepatitis A; Hepatitis B; Hepatitis C Endocrine Medical History: Negative for: Type I Diabetes; Type II Diabetes Immunological Medical History: Negative for: Lupus Erythematosus; Raynauds; Scleroderma Integumentary (Skin) Medical History: Negative for: History of Burn Musculoskeletal Medical History: Positive for: Rheumatoid Arthritis; Osteoarthritis Negative for: Gout; Osteomyelitis Neurologic Medical History: Negative for: Dementia; Neuropathy; Quadriplegia; Paraplegia; Seizure Disorder Oncologic Medical History: Negative for: Received Chemotherapy; Received Radiation Psychiatric Medical History: Negative for: Anorexia/bulimia; Confinement Anxiety Immunizations Pneumococcal Vaccine: Received Pneumococcal Vaccination: Yes Received Pneumococcal Vaccination On or After 60th Birthday: Yes Implantable Devices No devices added Hospitalization / Surgery History Type of Hospitalization/Surgery surgery toe right hip  replacement 12/2018 heart cath 12/17/2021 Family and Social History Cancer: Yes - Maternal Grandparents; Diabetes: Yes - Siblings; Heart Disease: Yes - Maternal Grandparents,Paternal Grandparents,Mother,Father,Siblings; Hereditary Spherocytosis: No; Hypertension: Yes - Mother,Father,Siblings; Kidney Disease: No; Lung Disease: No; Seizures: No; Stroke: Yes - Paternal MAIANH, MALONSON ([id number]) [id number] Page 11 of 11 Grandparents; Thyroid Problems: No; Tuberculosis: No; Never smoker; Marital Status - Married; Alcohol Use: Moderate; Drug Use: No History; Caffeine Use: Moderate; Financial Concerns: No; Food, Clothing or Shelter Needs: No; Support System Lacking: No; Transportation Concerns: No Electronic Signature(s) Signed: 01/12/2023 4:14:51 PM By: Hailey Corwin DO Entered By: [name] on 01/12/2023 12:24:31 -------------------------------------------------------------------------------- SuperBill Details Patient Name: Date of Service: [name], DO Hailey THY S. 01/12/2023 Medical Record Number: [id number] Patient Account Number: [id number] Date of Birth/Sex: Treating RN: March 24, 1946 (77 y.o. F) Primary Care Provider: Burton Apley Other Clinician: Referring Provider: Treating Provider/Extender: [name] in Treatment: 31 Diagnosis Coding ICD-10 Codes Code Description [id number] Non-pressure chronic ulcer of other part of left lower leg with other specified severity I87.312 Chronic venous hypertension (idiopathic) with ulcer of left lower extremity J84.9 Interstitial pulmonary disease, unspecified M06.9 Rheumatoid arthritis, unspecified Facility Procedures : CPT4 Code: 46962952 ( Description: Facility Use Only) Apligraf 1 SQ CM Modifier: Quantity: 44 : CPT4 Code: 84132440 1 Description: 5271 - SKIN SUB GRAFT TRNK/ARM/LEG ICD-10 Diagnosis Description L97.828 Non-pressure chronic ulcer of other part of left lower leg with  other specified s I87.312 Chronic venous hypertension (idiopathic) with ulcer of left lower extremity Modifier: everity Quantity: 1 Physician Procedures : CPT4 Code Description Modifier [id number] - WC PHYS SKIN SUB GRAFT TRNK/ARM/LEG ICD-10 Diagnosis Description L97.828 Non-pressure chronic ulcer of other part of left lower leg with other specified severity I87.312 Chronic venous hypertension  (idiopathic) with ulcer of left lower extremity Quantity: 1 Electronic Signature(s) Signed: 01/12/2023 4:14:51 PM By: Hailey Corwin DO Entered By: [name] on 01/12/2023 12:29:42

## 2023-01-13 NOTE — Progress Notes (Signed)
[name], [name] ([id number]) [id number] Page 1 of 8 Visit Report for 01/12/2023 Arrival Information Details Patient Name: Date of Service: Hailey Diamond, [name] 01/12/2023 11:00 A M Medical Record Number: [id number] Patient Account Number: [id number] Date of Birth/Sex: Treating RN: 04-11-1946 (77 y.o. F) Primary Care Delilah Mulgrew: Burton Apley Other Clinician: Referring Lejuan Botto: Treating Fatumata Kashani/Extender: Cherre Blanc in Treatment: 31 Visit Information History Since Last Visit Added or deleted any medications: No Patient Arrived: Wheel Chair Any Hailey allergies or adverse reactions: No Arrival Time: 11:21 Had a fall or experienced change in Yes Accompanied By: husband activities of daily living that may affect Transfer Assistance: None risk of falls: Patient Identification Verified: Yes Signs or symptoms of abuse/neglect since last visito No Secondary Verification Process Completed: Yes Hospitalized since last visit: No Patient Requires Transmission-Based Precautions: No Implantable device outside of the clinic excluding No Patient Has Alerts: Yes cellular tissue based products placed in the center Patient Alerts: ABI's: L: N/C 11/23 since last visit: Has Dressing in Place as Prescribed: Yes Pain Present Now: No Electronic Signature(s) Signed: 01/13/2023 1:07:25 PM By: Midge Aver MSN RN CNS WTA Entered By: Midge Aver on 01/12/2023 11:48:00 -------------------------------------------------------------------------------- Clinic Level of Care Assessment Details Patient Name: Date of Service: Hailey [name], [name] 01/12/2023 11:00 A M Medical Record Number: [id number] Patient Account Number: [id number] Date of Birth/Sex: Treating RN: 1946/01/01 (77 y.o. [name] Primary Care Tamari Redwine: Burton Apley Other Clinician: Referring Amoura Ransier: Treating Myeesha Shane/Extender: Cherre Blanc in Treatment:  31 Clinic Level of Care Assessment Items TOOL 1 Quantity Score []  - 0 Use when EandM and Procedure is performed on INITIAL visit ASSESSMENTS - Nursing Assessment / Reassessment []  - 0 General Physical Exam (combine w/ comprehensive assessment (listed just below) when performed on Hailey pt. evals) []  - 0 Comprehensive Assessment (HX, ROS, Risk Assessments, Wounds Hx, etc.) ASSESSMENTS - Wound and Skin Assessment / Reassessment []  - 0 Dermatologic / Skin Assessment (not related to wound area) ASSESSMENTS - Ostomy and/or Continence Assessment and Care []  - 0 Incontinence Assessment and Management []  - 0 Ostomy Care Assessment and Management (repouching, etc.) PROCESS - Coordination of Care []  - 0 Simple Patient / Family Education for ongoing care []  - 0 Complex (extensive) Patient / Family Education for ongoing care [name], [name] ([id number]) [id number] Page 2 of 8 []  - 0 Staff obtains Consents, Records, T Results / Process Orders est []  - 0 Staff telephones HHA, Nursing Homes / Clarify orders / etc []  - 0 Routine Transfer to another Facility (non-emergent condition) []  - 0 Routine Hospital Admission (non-emergent condition) []  - 0 Hailey Admissions / Manufacturing engineer / Ordering NPWT Apligraf, etc. , []  - 0 Emergency Hospital Admission (emergent condition) PROCESS - Special Needs []  - 0 Pediatric / Minor Patient Management []  - 0 Isolation Patient Management []  - 0 Hearing / Language / Visual special needs []  - 0 Assessment of Community assistance (transportation, D/C planning, etc.) []  - 0 Additional assistance / Altered mentation []  - 0 Support Surface(s) Assessment (bed, cushion, seat, etc.) INTERVENTIONS - Miscellaneous []  - 0 External ear exam []  - 0 Patient Transfer (multiple staff / Nurse, adult / Similar devices) []  - 0 Simple Staple / Suture removal (25 or less) []  - 0 Complex Staple / Suture removal (26 or more) []  -  0 Hypo/Hyperglycemic Management (Hailey not check if billed separately) []  - 0 Ankle / Brachial Index (ABI) - Hailey not  check if billed separately Has the patient been seen at the hospital within the last three years: Yes Total Score: 0 Level Of Care: ____ Electronic Signature(s) Signed: 01/13/2023 1:07:25 PM By: Midge Aver MSN RN CNS WTA Entered By: Midge Aver on 01/12/2023 11:55:16 -------------------------------------------------------------------------------- Encounter Discharge Information Details Patient Name: Date of Service: Hailey Diamond, [name] 01/12/2023 11:00 A M Medical Record Number: [id number] Patient Account Number: [id number] Date of Birth/Sex: Treating RN: 03-Jan-1946 (77 y.o. [name] Primary Care Nilesh Stegall: Burton Apley Other Clinician: Referring Soren Lazarz: Treating Roseline Ebarb/Extender: Cherre Blanc in Treatment: 31 Encounter Discharge Information Items Post Procedure Vitals Discharge Condition: Stable Temperature (F): 97.9 Ambulatory Status: Wheelchair Pulse (bpm): 96 Discharge Destination: Home Respiratory Rate (breaths/min): 20 Transportation: Private Auto Blood Pressure (mmHg): 149/83 Accompanied By: husband Schedule Follow-up Appointment: Yes Clinical Summary of Care: Electronic Signature(s) Signed: 01/12/2023 4:33:16 PM By: Midge Aver MSN RN CNS WTA Entered By: Midge Aver on 01/12/2023 16:33:16 [name] ([id number]) [id number] Page 3 of 8 -------------------------------------------------------------------------------- Lower Extremity Assessment Details Patient Name: Date of Service: Hailey Diamond, [name] 01/12/2023 11:00 A M Medical Record Number: [id number] Patient Account Number: [id number] Date of Birth/Sex: Treating RN: 1945-10-06 (77 y.o. F) Primary Care Fredna Stricker: Burton Apley Other Clinician: Referring Azzan Butler: Treating Jamiere Gulas/Extender: Cherre Blanc  in Treatment: 31 Edema Assessment Assessed: [name]: No] Franne Forts: No] Edema: [Left: N] [Right: o] Calf Left: Right: Point of Measurement: 29 cm From Medial Instep 29 cm Ankle Left: Right: Point of Measurement: 9 cm From Medial Instep 21.9 cm Electronic Signature(s) Signed: 01/13/2023 1:07:25 PM By: Midge Aver MSN RN CNS WTA Entered By: Midge Aver on 01/12/2023 11:48:55 -------------------------------------------------------------------------------- Multi Wound Chart Details Patient Name: Date of Service: Hailey Diamond, [name] 01/12/2023 11:00 A M Medical Record Number: [id number] Patient Account Number: [id number] Date of Birth/Sex: Treating RN: Jan 01, 1946 (77 y.o. F) Primary Care Jerret Mcbane: Burton Apley Other Clinician: Referring Arkel Cartwright: Treating Devaris Quirk/Extender: Cherre Blanc in Treatment: 31 Vital Signs Height(in): 61 Pulse(bpm): 98 Weight(lbs): 186 Blood Pressure(mmHg): 149/83 Body Mass Index(BMI): 35.1 Temperature(F): 97.9 Respiratory Rate(breaths/min): 20 [5:Photos:] [N/A:N/A] Left, Distal, Anterior Lower Leg N/A N/A Wound Location: Gradually Appeared N/A N/A Wounding Event: Cellulitis N/A N/A Primary Etiology: Asthma, Sleep Apnea, Hypertension, N/A N/A Comorbid History: Colitis, Rheumatoid Arthritis, Osteoarthritis [name], [name] ([id number]) [id number] Page 4 of 8 04/19/2022 N/A N/A Date Acquired: 85 N/A N/A Weeks of Treatment: Open N/A N/A Wound Status: No N/A N/A Wound Recurrence: 0.3x0.1x0.1 N/A N/A Measurements L x W x D (cm) 0.024 N/A N/A A (cm) : rea 0.002 N/A N/A Volume (cm) : 99.80% N/A N/A % Reduction in Area: 99.90% N/A N/A % Reduction in Volume: Full Thickness With Exposed Support N/A N/A Classification: Structures None Present N/A N/A Exudate Amount: Distinct, outline attached N/A N/A Wound Margin: None Present (0%) N/A N/A Granulation Amount: None Present (0%) N/A  N/A Necrotic Amount: Fascia: No N/A N/A Exposed Structures: Fat Layer (Subcutaneous Tissue): No Tendon: No Muscle: No Joint: No Bone: No Large (67-100%) N/A N/A Epithelialization: Scarring: Yes N/A N/A Periwound Skin Texture: Excoriation: No Induration: No Callus: No Crepitus: No Rash: No Maceration: No N/A N/A Periwound Skin Moisture: Dry/Scaly: No Atrophie Blanche: No N/A N/A Periwound Skin Color: Cyanosis: No Ecchymosis: No Erythema: No Hemosiderin Staining: No Mottled: No Pallor: No Rubor: No No Abnormality N/A N/A Temperature: Cellular or Tissue Based Product N/A N/A Procedures Performed: Treatment Notes Wound #5 (Lower  Leg) Wound Laterality: Left, Anterior, Distal Cleanser Wound Cleanser Discharge Instruction: Cleanse the wound with wound cleanser prior to applying a clean dressing using gauze sponges, not tissue or cotton balls. Peri-Wound Care Skin Prep Discharge Instruction: Use skin prep as directed Topical Primary Dressing Apligraf Discharge Instruction: applied by Shelisa Fern. Adaptic and steri-strips Discharge Instruction: Used to secure Puraply AM. Leave in place. Hailey not remove. Secondary Dressing Zetuvit Plus Silicone Border Dressing 4x4 (in/in) Discharge Instruction: Apply silicone border over primary dressing as directed. Secured With Compression Wrap Compression Stockings Add-Ons Electronic Signature(s) Signed: 01/12/2023 4:14:51 PM By: Geralyn Corwin Hailey Entered By: Geralyn Corwin on 01/12/2023 12:23:51 [name] ([id number]) [id number] Page 5 of 8 -------------------------------------------------------------------------------- Multi-Disciplinary Care Plan Details Patient Name: Date of Service: Hailey Diamond, [name] 01/12/2023 11:00 A M Medical Record Number: [id number] Patient Account Number: [id number] Date of Birth/Sex: Treating RN: [birth year] (77 y.o. [name] Primary Care Shenica Holzheimer: Burton Apley Other Clinician: Referring Vergia Chea: Treating Kip Cropp/Extender: Cherre Blanc in Treatment: 31 Active Inactive Pain, Acute or Chronic Nursing Diagnoses: Pain, acute or chronic: actual or potential Potential alteration in comfort, pain Goals: Patient will verbalize adequate pain control and receive pain control interventions during procedures as needed Date Initiated: 06/03/2022 Date Inactivated: 01/12/2023 Target Resolution Date: 01/13/2023 Goal Status: Met Patient/caregiver will verbalize comfort level met Date Initiated: 06/03/2022 Target Resolution Date: 01/13/2023 Goal Status: Active Interventions: Encourage patient to take pain medications as prescribed Provide education on pain management Treatment Activities: Administer pain control measures as ordered : 06/03/2022 Notes: Electronic Signature(s) Signed: 01/13/2023 1:07:25 PM By: Midge Aver MSN RN CNS WTA Entered By: Midge Aver on 01/12/2023 11:54:25 -------------------------------------------------------------------------------- Pain Assessment Details Patient Name: Date of Service: Hailey Diamond, [name] 01/12/2023 11:00 A M Medical Record Number: [id number] Patient Account Number: [id number] Date of Birth/Sex: Treating RN: March 11, 1946 (77 y.o. F) Primary Care Keyontay Stolz: Burton Apley Other Clinician: Referring Vestal Markin: Treating Myelle Poteat/Extender: Cherre Blanc in Treatment: 31 Active Problems Location of Pain Severity and Description of Pain Patient Has Paino No Site Locations [name], [name] ([id number]) [id number] Page 6 of 8 Pain Management and Medication Current Pain Management: Electronic Signature(s) Signed: 01/13/2023 1:07:25 PM By: Midge Aver MSN RN CNS WTA Entered By: Midge Aver on 01/12/2023 11:48:14 -------------------------------------------------------------------------------- Patient/Caregiver Education  Details Patient Name: Date of Service: Hailey [name], [name] 6/27/2024andnbsp11:00 A M Medical Record Number: [id number] Patient Account Number: [id number] Date of Birth/Gender: Treating RN: Oct 12, 1945 (77 y.o. [name] Primary Care Physician: Burton Apley Other Clinician: Referring Physician: Treating Physician/Extender: Cherre Blanc in Treatment: 31 Education Assessment Education Provided To: Patient Education Topics Provided Wound/Skin Impairment: Handouts: Caring for Your Ulcer Methods: Explain/Verbal Responses: State content correctly Electronic Signature(s) Signed: 01/13/2023 1:07:25 PM By: Midge Aver MSN RN CNS WTA Entered By: Midge Aver on 01/12/2023 11:54:51 -------------------------------------------------------------------------------- Wound Assessment Details Patient Name: Date of Service: Hailey Diamond, [name] 01/12/2023 11:00 A M Medical Record Number: [id number] Patient Account Number: [id number] Date of Birth/Sex: Treating RN: 1946-06-27 (77 y.o. F) Primary Care Alton Tremblay: Burton Apley Other Clinician: Foy [name] ([id number]) [id number] Page 7 of 8 Referring Amadu Schlageter: Treating Wai Minotti/Extender: Cherre Blanc in Treatment: 31 Wound Status Wound Number: 5 Primary Cellulitis Etiology: Wound Location: Left, Distal, Anterior Lower Leg Wound Open Wounding Event: Gradually Appeared Status: Date Acquired: 04/19/2022 Comorbid Asthma, Sleep Apnea, Hypertension, Colitis, Rheumatoid Weeks Of Treatment: 31 History: Arthritis,  Osteoarthritis Clustered Wound: No Photos Wound Measurements Length: (cm) 0.2 Width: (cm) 0.1 Depth: (cm) 0.1 Area: (cm) 0.016 Volume: (cm) 0.002 % Reduction in Area: 99.9% % Reduction in Volume: 99.9% Epithelialization: Large (67-100%) Tunneling: No Undermining: No Wound Description Classification: Full Thickness With Exposed Support  Structures Wound Margin: Distinct, outline attached Exudate Amount: None Present Foul Odor After Cleansing: No Slough/Fibrino Yes Wound Bed Granulation Amount: None Present (0%) Exposed Structure Necrotic Amount: None Present (0%) Fascia Exposed: No Fat Layer (Subcutaneous Tissue) Exposed: No Tendon Exposed: No Muscle Exposed: No Joint Exposed: No Bone Exposed: No Periwound Skin Texture Texture Color No Abnormalities Noted: No No Abnormalities Noted: No Callus: No Atrophie Blanche: No Crepitus: No Cyanosis: No Excoriation: No Ecchymosis: No Induration: No Erythema: No Rash: No Hemosiderin Staining: No Scarring: Yes Mottled: No Pallor: No Moisture Rubor: No No Abnormalities Noted: No Dry / Scaly: No Temperature / Pain Maceration: No Temperature: No Abnormality Treatment Notes Wound #5 (Lower Leg) Wound Laterality: Left, Anterior, Distal Cleanser Wound Cleanser Discharge Instruction: Cleanse the wound with wound cleanser prior to applying a clean dressing using gauze sponges, not tissue or cotton balls. Peri-Wound Care Skin Prep Discharge Instruction: Use skin prep as directed Topical MERIT, ALMEDA ([id number]) [id number] Page 8 of 8 Primary Dressing Apligraf Discharge Instruction: applied by Orvil Faraone. Adaptic and steri-strips Discharge Instruction: Used to secure Puraply AM. Leave in place. Hailey not remove. Secondary Dressing Zetuvit Plus Silicone Border Dressing 4x4 (in/in) Discharge Instruction: Apply silicone border over primary dressing as directed. Secured With Compression Wrap Compression Stockings Facilities manager) Signed: 01/12/2023 4:14:51 PM By: Geralyn Corwin Hailey Entered By: Geralyn Corwin on 01/12/2023 12:26:31 -------------------------------------------------------------------------------- Vitals Details Patient Name: Date of Service: Hailey [name], [name] 01/12/2023 11:00 A M Medical Record Number:  [id number] Patient Account Number: [id number] Date of Birth/Sex: Treating RN: May 07, 1946 (77 y.o. F) Primary Care Alberta Cairns: Burton Apley Other Clinician: Referring Candid Bovey: Treating Avarae Zwart/Extender: Cherre Blanc in Treatment: 31 Vital Signs Time Taken: 11:28 Temperature (F): 97.9 Height (in): 61 Pulse (bpm): 98 Weight (lbs): 186 Respiratory Rate (breaths/min): 20 Body Mass Index (BMI): 35.1 Blood Pressure (mmHg): 149/83 Reference Range: 80 - 120 mg / dl Electronic Signature(s) Signed: 01/13/2023 1:07:25 PM By: Midge Aver MSN RN CNS WTA Entered By: Midge Aver on 01/12/2023 11:48:09

## 2023-01-16 NOTE — Progress Notes (Signed)
[name], [name] ([id number]) [id number] Page 1 of 7 Visit Report for 11/24/2022 Arrival Information Details Patient Name: Date of Service: Hailey Diamond, DO [name] 11/24/2022 10:30 A M Medical Record Number: [id number] Patient Account Number: [id number] Date of Birth/Sex: Treating RN: 1946-03-29 (77 y.o. F) Primary Care Karita Dralle: Burton Apley Other Clinician: Referring Supreme Rybarczyk: Treating Tangie Stay/Extender: Cherre Blanc in Treatment: 24 Visit Information History Since Last Visit Added or deleted any medications: No Patient Arrived: Wheel Chair Any new allergies or adverse reactions: No Arrival Time: 10:45 Had a fall or experienced change in No Accompanied By: husband activities of daily living that may affect Transfer Assistance: None risk of falls: Patient Identification Verified: Yes Signs or symptoms of abuse/neglect since last visito No Secondary Verification Process Completed: Yes Hospitalized since last visit: No Patient Requires Transmission-Based Precautions: No Implantable device outside of the clinic excluding No Patient Has Alerts: Yes cellular tissue based products placed in the center Patient Alerts: ABI's: L: N/C 11/23 since last visit: Has Dressing in Place as Prescribed: Yes Has Compression in Place as Prescribed: Yes Pain Present Now: No Electronic Signature(s) Signed: 01/16/2023 4:30:35 PM By: Thayer [name] Entered By: Thayer [name] on 11/24/2022 10:45:32 -------------------------------------------------------------------------------- Encounter Discharge Information Details Patient Name: Date of Service: Hailey Diamond, DO [name] 11/24/2022 10:30 A M Medical Record Number: [id number] Patient Account Number: [id number] Date of Birth/Sex: Treating RN: 1945-09-16 (77 y.o. [name] Primary Care Haylo Fake: Burton Apley Other Clinician: Referring Jaiveer Panas: Treating Camey Edell/Extender: Cherre Blanc in Treatment: 24 Encounter Discharge Information Items Post Procedure Vitals Discharge Condition: Stable Temperature (F): 97.6 Ambulatory Status: Wheelchair Pulse (bpm): 78 Discharge Destination: Home Respiratory Rate (breaths/min): 20 Transportation: Private Auto Blood Pressure (mmHg): 141/69 Accompanied By: spouse Schedule Follow-up Appointment: Yes Clinical Summary of Care: Electronic Signature(s) Signed: 11/24/2022 5:46:17 PM By: Shawn Stall RN, BSN Entered By: Shawn Stall on 11/24/2022 11:20:16 [name] ([id number]) [id number] Page 2 of 7 -------------------------------------------------------------------------------- Lower Extremity Assessment Details Patient Name: Date of Service: [name] [name] 11/24/2022 10:30 A M Medical Record Number: [id number] Patient Account Number: [id number] Date of Birth/Sex: Treating RN: 02-09-1946 (77 y.o. F) Primary Care Crist Kruszka: Burton Apley Other Clinician: Referring Ramsey Guadamuz: Treating Thomas Mabry/Extender: Cherre Blanc in Treatment: 24 Edema Assessment Assessed: [name]: No] Franne Forts: No] Edema: [Left: N] [Right: o] Calf Left: Right: Point of Measurement: 29 cm From Medial Instep 30 cm Ankle Left: Right: Point of Measurement: 9 cm From Medial Instep 23 cm Electronic Signature(s) Signed: 01/16/2023 4:30:35 PM By: Thayer [name] Entered By: Thayer [name] on 11/24/2022 10:46:01 -------------------------------------------------------------------------------- Multi Wound Chart Details Patient Name: Date of Service: Hailey Diamond, DO [name] 11/24/2022 10:30 A M Medical Record Number: [id number] Patient Account Number: [id number] Date of Birth/Sex: Treating RN: [birth year] (77 y.o. F) Primary Care Merriel Zinger: Burton Apley Other Clinician: Referring Navaeh Kehres: Treating Christan Defranco/Extender: Cherre Blanc in Treatment: 24 Vital Signs Height(in):  61 Pulse(bpm): 78 Weight(lbs): 186 Blood Pressure(mmHg): 141/69 Body Mass Index(BMI): 35.1 Temperature(F): 97.6 Respiratory Rate(breaths/min): 20 [5:Photos:] [N/A:N/A] Left, Distal, Anterior Lower Leg N/A N/A Wound Location: Gradually Appeared N/A N/A Wounding Event: Cellulitis N/A N/A Primary Etiology: Asthma, Sleep Apnea, Hypertension, N/A N/A Comorbid History: Colitis, Rheumatoid Arthritis, Osteoarthritis Hailey, [name] ([id number]) [id number] Page 3 of 7 04/19/2022 N/A N/A Date Acquired: 62 N/A N/A Weeks of Treatment: Open N/A N/A Wound Status: No N/A N/A Wound Recurrence: 3.5x1x0.2 N/A N/A Measurements L x W  x D (cm) 2.749 N/A N/A A (cm) : rea 0.55 N/A N/A Volume (cm) : 82.30% N/A N/A % Reduction in Area: 64.60% N/A N/A % Reduction in Volume: Full Thickness With Exposed Support N/A N/A Classification: Structures Medium N/A N/A Exudate A mount: Serosanguineous N/A N/A Exudate Type: red, brown N/A N/A Exudate Color: Distinct, outline attached N/A N/A Wound Margin: Large (67-100%) N/A N/A Granulation A mount: Pink, Pale, Hyper-granulation N/A N/A Granulation Quality: Small (1-33%) N/A N/A Necrotic A mount: Fat Layer (Subcutaneous Tissue): Yes N/A N/A Exposed Structures: Fascia: No Tendon: No Muscle: No Joint: No Bone: No Large (67-100%) N/A N/A Epithelialization: Debridement - Excisional N/A N/A Debridement: Pre-procedure Verification/Time Out 11:08 N/A N/A Taken: Lidocaine 4% Topical Solution N/A N/A Pain Control: Subcutaneous, Slough N/A N/A Tissue Debrided: Skin/Subcutaneous Tissue N/A N/A Level: 2.75 N/A N/A Debridement A (sq cm): rea Curette N/A N/A Instrument: Minimum N/A N/A Bleeding: Pressure N/A N/A Hemostasis A chieved: 0 N/A N/A Procedural Pain: 0 N/A N/A Post Procedural Pain: Procedure was tolerated well N/A N/A Debridement Treatment Response: 3.5x1x0.2 N/A N/A Post Debridement  Measurements L x W x D (cm) 0.55 N/A N/A Post Debridement Volume: (cm) Excoriation: No N/A N/A Periwound Skin Texture: Induration: No Callus: No Crepitus: No Rash: No Scarring: No Maceration: No N/A N/A Periwound Skin Moisture: Dry/Scaly: No Atrophie Blanche: No N/A N/A Periwound Skin Color: Cyanosis: No Ecchymosis: No Erythema: No Hemosiderin Staining: No Mottled: No Pallor: No Rubor: No No Abnormality N/A N/A Temperature: Cellular or Tissue Based Product N/A N/A Procedures Performed: Debridement Treatment Notes Wound #5 (Lower Leg) Wound Laterality: Left, Anterior, Distal Cleanser Wound Cleanser Discharge Instruction: Cleanse the wound with wound cleanser prior to applying a clean dressing using gauze sponges, not tissue or cotton balls. Peri-Wound Care Topical Primary Dressing Puraply #15 Discharge Instruction: applied by Julie-Anne Torain. Adaptic and steri-strips Discharge Instruction: Used to secure Puraply AM. Leave in place. Do not remove. Secondary Dressing Zetuvit Plus Silicone Border Dressing 5x5 (in/in) Discharge Instruction: Apply silicone border over primary dressing as directed. SHANTERRIA, LOPEZRODRIGUEZ ([id number]) [id number] Page 4 of 7 Secured With Compression Wrap Compression Stockings Add-Ons Electronic Signature(s) Signed: 11/24/2022 12:25:07 PM By: Geralyn Corwin DO Entered By: Geralyn Corwin on 11/24/2022 11:26:28 -------------------------------------------------------------------------------- Multi-Disciplinary Care Plan Details Patient Name: Date of Service: Hailey Diamond, DO [name] 11/24/2022 10:30 A M Medical Record Number: [id number] Patient Account Number: [id number] Date of Birth/Sex: Treating RN: [birth year] (77 y.o. [name], [name] Primary Care Witten Certain: Burton Apley Other Clinician: Referring Breck Hollinger: Treating Lachrista Heslin/Extender: Cherre Blanc in Treatment: 24 Active Inactive Pain, Acute or  Chronic Nursing Diagnoses: Pain, acute or chronic: actual or potential Potential alteration in comfort, pain Goals: Patient will verbalize adequate pain control and receive pain control interventions during procedures as needed Date Initiated: 06/03/2022 Target Resolution Date: 12/09/2022 Goal Status: Active Patient/caregiver will verbalize comfort level met Date Initiated: 06/03/2022 Target Resolution Date: 12/09/2022 Goal Status: Active Interventions: Encourage patient to take pain medications as prescribed Provide education on pain management Treatment Activities: Administer pain control measures as ordered : 06/03/2022 Notes: Electronic Signature(s) Signed: 11/24/2022 5:46:17 PM By: Shawn Stall RN, BSN Entered By: Shawn Stall on 11/24/2022 11:18:53 -------------------------------------------------------------------------------- Pain Assessment Details Patient Name: Date of Service: Hailey Diamond, DO [name] 11/24/2022 10:30 A M Medical Record Number: [id number] Patient Account Number: [id number] Date of Birth/Sex: Treating RN: Jun 12, 1946 (77 y.o. F) Primary Care Karyss Frese: Burton Apley Other Clinician: Referring Arturo Sofranko: Treating Jami Ohlin/Extender: Lubertha South,  Deno Etienne ([id number]) [id number] Page 5 of 7 Weeks in Treatment: 24 Active Problems Location of Pain Severity and Description of Pain Patient Has Paino No Site Locations Pain Management and Medication Current Pain Management: Electronic Signature(s) Signed: 01/16/2023 4:30:35 PM By: Thayer [name] Entered By: Thayer [name] on 11/24/2022 10:45:44 -------------------------------------------------------------------------------- Patient/Caregiver Education Details Patient Name: Date of Service: [name], DO RO Leanne Lovely 5/9/2024andnbsp10:30 A M Medical Record Number: [id number] Patient Account Number: [id number] Date of Birth/Gender: Treating RN: 1945-09-05 (77 y.o. [name] Primary Care Physician: Burton Apley Other Clinician: Referring Physician: Treating Physician/Extender: Cherre Blanc in Treatment: 24 Education Assessment Education Provided To: Patient Education Topics Provided Wound/Skin Impairment: Handouts: Caring for Your Ulcer Methods: Explain/Verbal Responses: Reinforcements needed Electronic Signature(s) Signed: 11/24/2022 5:46:17 PM By: Shawn Stall RN, BSN Entered By: Shawn Stall on 11/24/2022 11:19:03 [name] ([id number]) [id number] Page 6 of 7 -------------------------------------------------------------------------------- Wound Assessment Details Patient Name: Date of Service: Hailey Diamond, DO [name] 11/24/2022 10:30 A M Medical Record Number: [id number] Patient Account Number: [id number] Date of Birth/Sex: Treating RN: 1945-12-08 (77 y.o. F) Primary Care Mikhaela Zaugg: Burton Apley Other Clinician: Referring Whitnee Orzel: Treating Marcellene Shivley/Extender: Cherre Blanc in Treatment: 24 Wound Status Wound Number: 5 Primary Cellulitis Etiology: Wound Location: Left, Distal, Anterior Lower Leg Wound Open Wounding Event: Gradually Appeared Status: Date Acquired: 04/19/2022 Comorbid Asthma, Sleep Apnea, Hypertension, Colitis, Rheumatoid Weeks Of Treatment: 24 History: Arthritis, Osteoarthritis Clustered Wound: No Photos Wound Measurements Length: (cm) Width: (cm) Depth: (cm) Area: (cm) Volume: (cm) 3.5 % Reduction in Area: 82.3% 1 % Reduction in Volume: 64.6% 0.2 Epithelialization: Large (67-100%) 2.749 Tunneling: No 0.55 Undermining: No Wound Description Classification: Full Thickness With Exposed Suppor Wound Margin: Distinct, outline attached Exudate Amount: Medium Exudate Type: Serosanguineous Exudate Color: red, brown t Structures Foul Odor After Cleansing: No Slough/Fibrino Yes Wound Bed Granulation Amount: Large (67-100%)  Exposed Structure Granulation Quality: Pink, Pale, Hyper-granulation Fascia Exposed: No Necrotic Amount: Small (1-33%) Fat Layer (Subcutaneous Tissue) Exposed: Yes Necrotic Quality: Adherent Slough Tendon Exposed: No Muscle Exposed: No Joint Exposed: No Bone Exposed: No Periwound Skin Texture Texture Color No Abnormalities Noted: No No Abnormalities Noted: No Callus: No Atrophie Blanche: No Crepitus: No Cyanosis: No Excoriation: No Ecchymosis: No Induration: No Erythema: No Rash: No Hemosiderin Staining: No Scarring: No Mottled: No Pallor: No Moisture Rubor: No No Abnormalities Noted: No Dry / Scaly: No Temperature / Pain Maceration: No Temperature: No Abnormality CHIOMA, TAY ([id number]) [id number] Page 7 of 7 Electronic Signature(s) Signed: 01/16/2023 4:30:35 PM By: Thayer [name] Entered By: Thayer [name] on 11/24/2022 10:52:37 -------------------------------------------------------------------------------- Vitals Details Patient Name: Date of Service: Hailey Diamond, DO [name] 11/24/2022 10:30 A M Medical Record Number: [id number] Patient Account Number: [id number] Date of Birth/Sex: Treating RN: 1946/06/05 (77 y.o. F) Primary Care Orit Sanville: Burton Apley Other Clinician: Referring Maijor Hornig: Treating Pesach Frisch/Extender: Cherre Blanc in Treatment: 24 Vital Signs Time Taken: 10:52 Temperature (F): 97.6 Height (in): 61 Pulse (bpm): 78 Weight (lbs): 186 Respiratory Rate (breaths/min): 20 Body Mass Index (BMI): 35.1 Blood Pressure (mmHg): 141/69 Reference Range: 80 - 120 mg / dl Electronic Signature(s) Signed: 01/16/2023 4:30:35 PM By: Thayer [name] Entered By: Thayer [name] on 11/24/2022 10:53:09

## 2023-01-18 ENCOUNTER — Telehealth: Payer: Self-pay | Admitting: Internal Medicine

## 2023-01-18 DIAGNOSIS — J849 Interstitial pulmonary disease, unspecified: Secondary | ICD-10-CM

## 2023-01-18 NOTE — Telephone Encounter (Signed)
Patient is requesting a refill for her ESBRIET 801 MG TABS .  Please advise.  CB# [phone]

## 2023-01-20 ENCOUNTER — Other Ambulatory Visit: Payer: Self-pay

## 2023-01-20 ENCOUNTER — Inpatient Hospital Stay (HOSPITAL_COMMUNITY)
Admission: EM | Admit: 2023-01-20 | Discharge: 2023-01-25 | DRG: 291 | Disposition: A | Discharge status: Skilled Nursing Facility | Payer: Medicare Other | Attending: Internal Medicine | Admitting: Internal Medicine

## 2023-01-20 ENCOUNTER — Encounter (HOSPITAL_COMMUNITY): Payer: Self-pay

## 2023-01-20 ENCOUNTER — Emergency Department (HOSPITAL_COMMUNITY): Payer: Medicare Other

## 2023-01-20 ENCOUNTER — Encounter (HOSPITAL_BASED_OUTPATIENT_CLINIC_OR_DEPARTMENT_OTHER): Payer: Medicare Other | Admitting: Internal Medicine

## 2023-01-20 DIAGNOSIS — I110 Hypertensive heart disease with heart failure: Principal | ICD-10-CM | POA: Diagnosis present

## 2023-01-20 DIAGNOSIS — J9621 Acute and chronic respiratory failure with hypoxia: Secondary | ICD-10-CM | POA: Diagnosis not present

## 2023-01-20 DIAGNOSIS — J4489 Other specified chronic obstructive pulmonary disease: Secondary | ICD-10-CM | POA: Diagnosis present

## 2023-01-20 DIAGNOSIS — G8929 Other chronic pain: Secondary | ICD-10-CM | POA: Diagnosis present

## 2023-01-20 DIAGNOSIS — Z515 Encounter for palliative care: Secondary | ICD-10-CM

## 2023-01-20 DIAGNOSIS — J9809 Other diseases of bronchus, not elsewhere classified: Secondary | ICD-10-CM | POA: Diagnosis present

## 2023-01-20 DIAGNOSIS — Z66 Do not resuscitate: Secondary | ICD-10-CM | POA: Diagnosis present

## 2023-01-20 DIAGNOSIS — Z96641 Presence of right artificial hip joint: Secondary | ICD-10-CM | POA: Diagnosis present

## 2023-01-20 DIAGNOSIS — Z96653 Presence of artificial knee joint, bilateral: Secondary | ICD-10-CM | POA: Diagnosis present

## 2023-01-20 DIAGNOSIS — I5033 Acute on chronic diastolic (congestive) heart failure: Secondary | ICD-10-CM | POA: Insufficient documentation

## 2023-01-20 DIAGNOSIS — E876 Hypokalemia: Secondary | ICD-10-CM | POA: Diagnosis present

## 2023-01-20 DIAGNOSIS — Z1152 Encounter for screening for COVID-19: Secondary | ICD-10-CM

## 2023-01-20 DIAGNOSIS — Z888 Allergy status to other drugs, medicaments and biological substances status: Secondary | ICD-10-CM

## 2023-01-20 DIAGNOSIS — Z8249 Family history of ischemic heart disease and other diseases of the circulatory system: Secondary | ICD-10-CM

## 2023-01-20 DIAGNOSIS — Z981 Arthrodesis status: Secondary | ICD-10-CM

## 2023-01-20 DIAGNOSIS — E669 Obesity, unspecified: Secondary | ICD-10-CM | POA: Diagnosis present

## 2023-01-20 DIAGNOSIS — Z833 Family history of diabetes mellitus: Secondary | ICD-10-CM

## 2023-01-20 DIAGNOSIS — K219 Gastro-esophageal reflux disease without esophagitis: Secondary | ICD-10-CM | POA: Diagnosis present

## 2023-01-20 DIAGNOSIS — Z79899 Other long term (current) drug therapy: Secondary | ICD-10-CM

## 2023-01-20 DIAGNOSIS — Z91011 Allergy to milk products: Secondary | ICD-10-CM

## 2023-01-20 DIAGNOSIS — K5090 Crohn's disease, unspecified, without complications: Secondary | ICD-10-CM | POA: Diagnosis present

## 2023-01-20 DIAGNOSIS — Z9981 Dependence on supplemental oxygen: Secondary | ICD-10-CM

## 2023-01-20 DIAGNOSIS — Z7982 Long term (current) use of aspirin: Secondary | ICD-10-CM

## 2023-01-20 DIAGNOSIS — J849 Interstitial pulmonary disease, unspecified: Secondary | ICD-10-CM | POA: Diagnosis present

## 2023-01-20 DIAGNOSIS — F341 Dysthymic disorder: Secondary | ICD-10-CM | POA: Diagnosis present

## 2023-01-20 DIAGNOSIS — Z8261 Family history of arthritis: Secondary | ICD-10-CM

## 2023-01-20 DIAGNOSIS — T380X5A Adverse effect of glucocorticoids and synthetic analogues, initial encounter: Secondary | ICD-10-CM | POA: Diagnosis present

## 2023-01-20 DIAGNOSIS — E785 Hyperlipidemia, unspecified: Secondary | ICD-10-CM | POA: Diagnosis present

## 2023-01-20 DIAGNOSIS — R197 Diarrhea, unspecified: Secondary | ICD-10-CM

## 2023-01-20 DIAGNOSIS — Z882 Allergy status to sulfonamides status: Secondary | ICD-10-CM

## 2023-01-20 DIAGNOSIS — I272 Pulmonary hypertension, unspecified: Secondary | ICD-10-CM | POA: Diagnosis present

## 2023-01-20 DIAGNOSIS — G4733 Obstructive sleep apnea (adult) (pediatric): Secondary | ICD-10-CM | POA: Diagnosis present

## 2023-01-20 DIAGNOSIS — F32A Depression, unspecified: Secondary | ICD-10-CM | POA: Diagnosis present

## 2023-01-20 DIAGNOSIS — D72829 Elevated white blood cell count, unspecified: Secondary | ICD-10-CM | POA: Diagnosis present

## 2023-01-20 DIAGNOSIS — Z6832 Body mass index (BMI) 32.0-32.9, adult: Secondary | ICD-10-CM

## 2023-01-20 DIAGNOSIS — M069 Rheumatoid arthritis, unspecified: Secondary | ICD-10-CM | POA: Diagnosis present

## 2023-01-20 DIAGNOSIS — F419 Anxiety disorder, unspecified: Secondary | ICD-10-CM | POA: Diagnosis present

## 2023-01-20 DIAGNOSIS — Z7952 Long term (current) use of systemic steroids: Secondary | ICD-10-CM

## 2023-01-20 LAB — I-STAT VENOUS BLOOD GAS, ED
Acid-base deficit: 1 mmol/L (ref 0.0–2.0)
Bicarbonate: 22.9 mmol/L (ref 20.0–28.0)
Calcium, Ion: 0.9 mmol/L — ABNORMAL LOW (ref 1.15–1.40)
HCT: 35 % — ABNORMAL LOW (ref 36.0–46.0)
Hemoglobin: 11.9 g/dL — ABNORMAL LOW (ref 12.0–15.0)
O2 Saturation: 83 %
Potassium: 3.9 mmol/L (ref 3.5–5.1)
Sodium: 128 mmol/L — ABNORMAL LOW (ref 135–145)
TCO2: 24 mmol/L (ref 22–32)
pCO2, Ven: 34.5 mmHg — ABNORMAL LOW (ref 44–60)
pH, Ven: 7.43 (ref 7.25–7.43)
pO2, Ven: 46 mmHg — ABNORMAL HIGH (ref 32–45)

## 2023-01-20 LAB — BASIC METABOLIC PANEL
Anion gap: 15 (ref 5–15)
BUN: 16 mg/dL (ref 8–23)
CO2: 23 mmol/L (ref 22–32)
Calcium: 10.2 mg/dL (ref 8.9–10.3)
Chloride: 93 mmol/L — ABNORMAL LOW (ref 98–111)
Creatinine, Ser: 0.81 mg/dL (ref 0.44–1.00)
GFR, Estimated: >60 mL/min (ref >60)
Glucose, Bld: 85 mg/dL (ref 70–99)
Potassium: 3.9 mmol/L (ref 3.5–5.1)
Sodium: 131 mmol/L — ABNORMAL LOW (ref 135–145)

## 2023-01-20 LAB — CBC
HCT: 34.7 % — ABNORMAL LOW (ref 36.0–46.0)
Hemoglobin: 10.6 g/dL — ABNORMAL LOW (ref 12.0–15.0)
MCH: 28.5 pg (ref 26.0–34.0)
MCHC: 30.5 g/dL (ref 30.0–36.0)
MCV: 93.3 fL (ref 80.0–100.0)
Platelets: 344 10*3/uL (ref 150–400)
RBC: 3.72 MIL/uL — ABNORMAL LOW (ref 3.87–5.11)
RDW: 14.2 % (ref 11.5–15.5)
WBC: 14.9 10*3/uL — ABNORMAL HIGH (ref 4.0–10.5)
nRBC: 0.2 % (ref 0.0–0.2)

## 2023-01-20 LAB — SARS CORONAVIRUS 2 BY RT PCR: SARS Coronavirus 2 by RT PCR: NEGATIVE

## 2023-01-20 LAB — BRAIN NATRIURETIC PEPTIDE: B Natriuretic Peptide: 495.9 pg/mL — ABNORMAL HIGH (ref 0.0–100.0)

## 2023-01-20 LAB — TROPONIN I (HIGH SENSITIVITY): Troponin I (High Sensitivity): 42 ng/L — ABNORMAL HIGH (ref <18)

## 2023-01-20 MED ORDER — FUROSEMIDE 10 MG/ML IJ SOLN
40.0000 mg | INTRAMUSCULAR | Status: AC
  Administered 2023-01-21: 40 mg via INTRAVENOUS
  Filled 2023-01-20: qty 4

## 2023-01-20 MED ORDER — IPRATROPIUM-ALBUTEROL 0.5-2.5 (3) MG/3ML IN SOLN
3.0000 mL | Freq: Once | RESPIRATORY_TRACT | Status: AC
  Administered 2023-01-20: 3 mL via RESPIRATORY_TRACT
  Filled 2023-01-20 (×2): qty 3

## 2023-01-20 NOTE — H&P (Signed)
History and Physical    [name] ZOX:[id number] DOB: 04-10-1946 DOA: 01/20/2023  PCP: Burton Apley, MD  Patient coming from: Home  I have personally briefly reviewed patient's old medical records in [facility]  Chief Complaint: Shortness of breath  HPI: [name] is a 77 y.o. female with medical history significant for ILD/pulmonary fibrosis, chronic hypoxic respiratory failure on 7 L O2 via [place] at baseline, PAH, HFpEF, RA on chronic prednisone 5 mg qd, OSA on CPAP who presented to the ED for evaluation of shortness of breath.  Patient states that she began to have increased difficulty with shortness of breath over 1 week ago.  She called her PCP who increased her prednisone to 20 mg for 10 days with plan to reduce back down to her usual 5 mg daily.  Patient states that initially she thought her breathing was improving however it has since been progressively worsening.  She has been short of breath at rest and significantly dyspneic with any activity.  She reports chronic cough occasionally productive of green sputum which is similar to baseline.  She has been fatigued and generally weak.  She is prescribed Lasix 80 mg daily but states that she had to decrease it down to 40 mg daily a while back due to inability to make it to the restroom in time when having to urinate.  She has not seen any significant swelling in her lower extremities.  ED Course  Labs/Imaging on admission: I have personally reviewed following labs and imaging studies.  Initial vitals showed BP 110/76, pulse 111, RR 29, temp 97.8 F, SpO2 97% on 15 L NRB.  Labs show WBC 14.9, hemoglobin 10.6, platelets 344,000, sodium 131, potassium 3.9, bicarb 23, BUN 16, creatinine 0.81, serum glucose 85, troponin 42, BNP 495.9.  SARS-CoV-2 PCR negative.  Portable chest x-ray shows cardiomegaly with vascular congestion and perihilar opacities, chronic fibrosis changes noted.  Patient was given DuoNeb treatment, IV  Lasix 40 mg, and transition to BiPAP.  The hospitalist service was consulted to admit for further evaluation and management.  Review of Systems: All systems reviewed and are negative except as documented in history of present illness above.   Past Medical History:  Diagnosis Date   Acute asthmatic bronchitis    Allergic rhinitis    Anxiety    Arthritis    Esophageal reflux    Hypertension    Interstitial lung disease (HCC) dx jan 2020   Irritable bowel syndrome    Oxygen dependent    4 liters day time 6 liters at night   PONV (postoperative nausea and vomiting)    ponv likes zofran, and scopolamine patch   Pulmonary hypertension (HCC)    Rheumatoid arthritis(714.0)    Sleep apnea     Past Surgical History:  Procedure Laterality Date    c secttion  1977   2 foot fusions Left    total of 6 left foot sx   ABDOMINAL HYSTERECTOMY     complete    ANKLE FUSION  2009   left   BACK SURGERY     lower l to l 5 fused   CHOLECYSTECTOMY     RIGHT HEART CATH N/A 11/09/2020   Procedure: RIGHT HEART CATH;  Surgeon: Dolores Patty, MD;  Location: [facility];  Service: Cardiovascular;  Laterality: N/A;   RIGHT/LEFT HEART CATH AND CORONARY ANGIOGRAPHY N/A 09/16/2016   Procedure: Right/Left Heart Cath and Coronary Angiography;  Surgeon: [name], MD;  Location: [facility];  Service: Cardiovascular;  Laterality: N/A;   RIGHT/LEFT HEART CATH AND CORONARY ANGIOGRAPHY N/A 12/17/2021   Procedure: RIGHT/LEFT HEART CATH AND CORONARY ANGIOGRAPHY;  Surgeon: Dolores Patty, MD;  Location: [facility];  Service: Cardiovascular;  Laterality: N/A;   TONSILLECTOMY     TOTAL HIP ARTHROPLASTY Right 12/20/2018   Procedure: TOTAL HIP ARTHROPLASTY ANTERIOR APPROACH;  Surgeon: Durene Romans, MD;  Location: [facility];  Service: Orthopedics;  Laterality: Right;  70 mins   TOTAL KNEE ARTHROPLASTY Bilateral     Social History:  reports that she has never smoked. She has never used  smokeless tobacco. She reports that she does not currently use alcohol after a past usage of about 7.0 standard drinks of alcohol per week. She reports that she does not use drugs.  Allergies  Allergen Reactions   Cefdinir Diarrhea   Erythromycin Base Diarrhea   Lactose Diarrhea   Lactulose Diarrhea   Mesalamine Nausea Only   Methotrexate Other (See Comments)    Felt sick   Nitrofuran Derivatives     shakiness   Other Diarrhea and Other (See Comments)    Shaking uncontrollably "lettuce only"    Sulfa Antibiotics Other (See Comments)    Patient can't recall reaction, but made her feel "really sick"    Tdap [Tetanus-Diphth-Acell Pertussis]     Shaking uncontrollably   Tetanus Toxoid, Adsorbed Other (See Comments)    Shaking uncontrollable     Family History  Problem Relation Age of Onset   Heart disease Mother    Arthritis Mother    Heart attack Father    Diabetes Other        sibling   Heart attack Other        sibling     Prior to Admission medications   Medication Sig Start Date End Date Taking? Authorizing Provider  albuterol (VENTOLIN HFA) 108 (90 Base) MCG/ACT inhaler USE 2 INHALATIONS BY MOUTH EVERY 6 HOURS AS NEEDED 11/23/22   Kalman Shan, MD  aspirin EC 81 MG tablet Take 81 mg by mouth daily.    [provider]  benzonatate (TESSALON) 200 MG capsule TAKE 1 CAPSULE (200 MG TOTAL) BY MOUTH 3 (THREE) TIMES DAILY AS NEEDED FOR COUGH. 11/30/22   Kalman Shan, MD  cetirizine (ZYRTEC) 10 MG tablet Take 20 mg by mouth daily.    [provider]  Cholecalciferol (VITAMIN D3) 250 MCG (10000 UT) capsule Take 10,000 Units by mouth daily.    [provider]  clidinium-chlordiazePOXIDE (LIBRAX) 5-2.5 MG capsule Take 1 capsule by mouth 3 (three) times daily as needed (for IBS).    [provider]  colestipol (COLESTID) 1 g tablet Take 1 g by mouth daily.    [provider]  dicyclomine (BENTYL) 20 MG tablet Take 20 mg by mouth  4 (four) times daily as needed (IBS).    [provider]  ESBRIET 801 MG TABS Take 1 tablet (801 mg total) by mouth 3 (three) times daily. 11/07/22   Kalman Shan, MD  FLUoxetine (PROZAC) 20 MG capsule Take 20 mg by mouth daily. 08/11/16   [provider]  fluticasone (FLONASE) 50 MCG/ACT nasal spray SPRAY 2 SPRAYS INTO EACH NOSTRIL EVERY DAY 04/01/20   Coral Ceo, NP  furosemide (LASIX) 80 MG tablet Take 1 tablet (80 mg total) by mouth daily. 09/15/22   End, Cristal Deer, MD  ipratropium (ATROVENT) 0.03 % nasal spray USE 1 TO 2 SPRAYS IN BOTH  NOSTRILS TWICE DAILY 05/30/22   Kalman Shan, MD  ipratropium-albuterol (DUONEB) 0.5-2.5 (3) MG/3ML SOLN USE 1 VIAL IN NEBULIZER EVERY 6 HOURS AS NEEDED J45.901 09/19/22   Kalman Shan, MD  leflunomide (ARAVA) 20 MG tablet Take 1 tablet (20 mg total) by mouth daily. 05/17/22   Zannie Cove, MD  loperamide (IMODIUM) 2 MG capsule Take 1 capsule (2 mg total) by mouth 2 (two) times daily as needed for diarrhea or loose stools. 05/03/22   Zannie Cove, MD  Menthol, Topical Analgesic, (BIOFREEZE COOL THE PAIN) 4 % GEL Apply 1 Application topically 2 (two) times daily as needed (pain).    [provider]  methocarbamol (ROBAXIN) 750 MG tablet Take 750 mg by mouth 2 (two) times daily as needed for muscle spasms. 07/22/20   [provider]  montelukast (SINGULAIR) 10 MG tablet TAKE 1 TABLET BY MOUTH AT  BEDTIME 05/30/22   Kalman Shan, MD  mupirocin ointment (BACTROBAN) 2 % Apply topically daily. 05/03/22   Zannie Cove, MD  omeprazole (PRILOSEC) 40 MG capsule Take 40 mg by mouth at bedtime.     [provider]  Oxycodone HCl 10 MG TABS Take 10 mg by mouth 3 (three) times daily. 05/30/22   [provider]  OXYGEN Inhale 7 L into the lungs continuous.    [provider]  Polyethyl Glycol-Propyl Glycol (SYSTANE) 0.4-0.3 % GEL ophthalmic gel Place 2 application  into both eyes 2 (two)  times daily as needed (dry eyes).    [provider]  potassium chloride SA (KLOR-CON M) 20 MEQ tablet TAKE 1 TABLET BY MOUTH TWICE  DAILY 09/19/22   End, Cristal Deer, MD  predniSONE (DELTASONE) 5 MG tablet Take 5 mg by mouth daily with breakfast.    [provider]  rosuvastatin (CRESTOR) 20 MG tablet TAKE 1 TABLET BY MOUTH DAILY 11/22/22   End, Cristal Deer, MD  Tocilizumab (ACTEMRA IV) Inject into the vein every 30 (thirty) days. infusion Patient not taking: Reported on 11/01/2022    [provider]  traZODone (DESYREL) 100 MG tablet Take 100 mg by mouth at bedtime.    [provider]  TRELEGY ELLIPTA 100-62.5-25 MCG/ACT AEPB USE 1 INHALATION BY MOUTH ONCE  DAILY AT THE SAME TIME EACH DAY 09/22/22   Kalman Shan, MD  TYVASO DPI MAINTENANCE KIT 64 MCG POWD Inhale 1 puff into the lungs in the morning, at noon, in the evening, and at bedtime. 12/15/22   Kalman Shan, MD  verapamil (CALAN-SR) 120 MG CR tablet Take 1 tablet (120 mg total) by mouth daily. 09/19/22   [name], MD    Physical Exam: Vitals:   01/20/23 2215 01/20/23 2230 01/20/23 2333 01/21/23 0005  BP: 110/76 130/69    Pulse: (!) 113 (!) 104 (!) 104 (!) 104  Resp: (!) 29 (!) 31 (!) 23 (!) 29  Temp:      TempSrc:      SpO2: 98% (!) 82% 91% 90%   Constitutional: Resting in bed, anxious while wearing BiPAP Eyes: EOMI, lids and conjunctivae normal ENMT: Mucous membranes are moist. Posterior pharynx clear of any exudate or lesions.Normal dentition.  Neck: normal, supple, no masses. Respiratory: Fine inspiratory crackles with expiratory wheezing bilaterally.  Increased respiratory effort. No accessory muscle use.  Cardiovascular: Regular rate and rhythm, no murmurs / rubs / gallops. No extremity edema. 2+ pedal pulses. Abdomen: no tenderness, no masses palpated..  Musculoskeletal: no clubbing / cyanosis. No joint deformity upper and lower extremities. Good ROM, no contractures.  Normal  muscle tone.  Skin: no rashes Neurologic: Sensation intact. Strength 5/5 in all 4.  Psychiatric: Normal judgment and insight. Alert and oriented x 3. Normal mood.   EKG: Personally reviewed. Sinus tachycardia, rate 106, PACs present.  Rate is faster when compared to previous, PVC no longer present.  Assessment/Plan Principal Problem:   Acute on chronic hypoxic respiratory failure (HCC) Active Problems:   Acute on chronic heart failure with preserved ejection fraction (HFpEF) (HCC)   ILD (interstitial lung disease) (HCC)   Pulmonary hypertension (HCC)   Hyperlipidemia LDL goal <70   ANXIETY DEPRESSION   Rheumatoid arthritis (HCC)   [name] is a 77 y.o. female with medical history significant for ILD/pulmonary fibrosis, chronic hypoxic respiratory failure on 7 L O2 via [place] at baseline, PAH, HFpEF, RA on chronic prednisone 5 mg qd, OSA on CPAP who is admitted with acute on chronic hypoxic respiratory failure due to ILD and HFpEF exacerbations.  Assessment and Plan: Acute on chronic hypoxic respiratory failure secondary to ILD/PF and HFpEF exacerbations: Multifactorial due to ILD and HFpEF exacerbations.  Significant wheezing and crackles on exam.  BNP elevated.  She has been using half dose of her prescribed Lasix due to inability to make it to the restroom.  Initially on NRB then placed on BiPAP.  She was not tolerating BiPAP and has been transitioned back to her home 7 L O2 via [place] at time of admission.  SpO2 maintaining 88-90% at this time. -IV Solu-Medrol 40 mg twice daily -IV Lasix 40 mg once -Scheduled Brovana/Pulmicort, DuoNeb as needed -Continue home 7 L O2 via [place], maintain SpO2 >88% -Continue Trelegy Ellipta, Esbriet, Singulair -BiPAP as needed and as tolerated  Leukocytosis: Likely secondary to recent increased steroid use.  X-ray not consistent with pneumonia.  Continue management as above and monitor.  Low threshold to add antibiotics if develops fever.  Pulmonary  hypertension: Continue Tyvaso.  Holding verapamil with soft blood pressure.  Rheumatoid arthritis: Continue leflunomide.  Holding home prednisone 5 mg daily while on IV Solu-Medrol.  Depression/anxiety: Continue Prozac.  Hyperlipidemia: Continue rosuvastatin.  OSA: Continue CPAP nightly.  Chronic pain: Resume home oxycodone with hold parameters.   DVT prophylaxis: enoxaparin (LOVENOX) injection 40 mg Start: 01/21/23 2200 Code Status: DNR, discussed with patient on admission. Family Communication: None present on admission. Disposition Plan: From home, dispo pending clinical progress Consults called: None Severity of Illness: The appropriate patient status for this patient is INPATIENT. Inpatient status is judged to be reasonable and necessary in order to provide the required intensity of service to ensure the patient's safety. The patient's presenting symptoms, physical exam findings, and initial radiographic and laboratory data in the context of their chronic comorbidities is felt to place them at high risk for further clinical deterioration. Furthermore, it is not anticipated that the patient will be medically stable for discharge from the hospital within 2 midnights of admission.   * I certify that at the point of admission it is my clinical judgment that the patient will require inpatient hospital care spanning beyond 2 midnights from the point of admission due to high intensity of service, high risk for further deterioration and high frequency of surveillance required.Darreld Mclean MD [facility]  If 7PM-7AM, please contact night-coverage [email or web address]  01/21/2023, 12:45 AM

## 2023-01-20 NOTE — ED Provider Notes (Signed)
Shared PA visit.  Patient here with shortness of breath.  History of COPD/pulmonary hypertension oxygen dependence he wears 4 to 6 L of oxygen daily, IBS.  Patient with symptoms for the last several days.  She is having to increase her oxygen.  She was given steroids a few days ago to help with may be pulmonary fibrosis/COPD exacerbation.  However not improving.  She arrives tachypneic on 15 L nonrebreather and nasal cannula.  Still does not feel comfortable.  Will place on BiPAP.  Workup is already been completed blood work wise.  BNP is 495, chest x-ray consistent with volume overload.  Troponin is 42.  However blood gas is overall reassuring.  My suspicion is likely multifactorial shortness of breath.  Likely inflammatory process with fluid process as well.  Will put her on BiPAP and have her admitted to medicine for further care.  She been given steroids and diuretics.  EKG shows sinus rhythm.  No ischemic changes.  This chart was dictated using voice recognition software.  Despite best efforts to proofread,  errors can occur which can change the documentation meaning.    Virgina Norfolk, DO 01/20/23 2330

## 2023-01-20 NOTE — ED Triage Notes (Signed)
Pt bibgcems from home. Pt c/o sob she's had for about a week and a half. Pt has pulmonary fibroses and chf. On 7 L at baseline. Was prescribed prednisone Monday. Upper lobes clear with ems, lower lobes with rhonchi. Pt c/o fatigue, decrease in appetite and decrease in mobility.  Vitals 160/110 Hr 104  91% 7 L  88 cbg

## 2023-01-20 NOTE — ED Provider Notes (Signed)
[facility] Emergency Department Provider Note MRN:  [id number]  Arrival date & time: 01/20/23     Chief Complaint   Shortness of Breath (/)   History of Present Illness   [name] is a 77 y.o. year-old female presents to the ED with chief complaint of SOB.  Hx of chronic respiratory failure with hypoxia on 7L [place] at baseline.  Hx of ILD, pulm HTN, OSA, followed by pulmonology.  Reports worsening SOB and cough for the past week.  She states that she has been having fevers and chills at night.  Prescribed prednisone on Monday, but continues to have worsening WOB.  Has felt increasingly fatigued and reports decreased mobility due to exertional SOB/fatigue.  Noted to be in mid to upper 80s O2 sat on her baseline 7L [place].  RN placed on NRB.  History provided by patient.   Review of Systems  Pertinent positive and negative review of systems noted in HPI.    Physical Exam   Vitals:   01/20/23 2215 01/20/23 2230  BP: 110/76 130/69  Pulse: (!) 113 (!) 104  Resp: (!) 29 (!) 31  Temp:    SpO2: 98% (!) 82%    CONSTITUTIONAL:  chronically ill-appearing, NAD NEURO:  Alert and oriented x 3, CN 3-12 grossly intact EYES:  eyes equal and reactive ENT/NECK:  Supple, no stridor  CARDIO:  Tachycardic rate, regular rhythm, appears well-perfused  PULM:  No respiratory distress, wheezing present GI/GU:  non-distended, no abdominal tenderness MSK/SPINE:  Baseline deformity of left ankle, no significant LE edema SKIN:  no rash, atraumatic   *Additional and/or pertinent findings included in MDM below  Diagnostic and Interventional Summary    EKG Interpretation Date/Time:  Friday January 20 2023 22:04:52 EDT Ventricular Rate:  106 PR Interval:  139 QRS Duration:  96 QT Interval:  307 QTC Calculation: 408 R Axis:   256  Text Interpretation: Sinus tachycardia Supraventricular bigeminy Left anterior fascicular block Confirmed by Virgina Norfolk [phone]) on 01/20/2023  11:14:40 PM       Labs Reviewed  BASIC METABOLIC PANEL - Abnormal; Notable for the following components:      Result Value   Sodium 131 (*)    Chloride 93 (*)    All other components within normal limits  CBC - Abnormal; Notable for the following components:   WBC 14.9 (*)    RBC 3.72 (*)    Hemoglobin 10.6 (*)    HCT 34.7 (*)    All other components within normal limits  BRAIN NATRIURETIC PEPTIDE - Abnormal; Notable for the following components:   B Natriuretic Peptide 495.9 (*)    All other components within normal limits  I-STAT VENOUS BLOOD GAS, ED - Abnormal; Notable for the following components:   pCO2, Ven 34.5 (*)    pO2, Ven 46 (*)    Sodium 128 (*)    Calcium, Ion 0.90 (*)    HCT 35.0 (*)    Hemoglobin 11.9 (*)    All other components within normal limits  TROPONIN I (HIGH SENSITIVITY) - Abnormal; Notable for the following components:   Troponin I (High Sensitivity) 42 (*)    All other components within normal limits  SARS CORONAVIRUS 2 BY RT PCR  TROPONIN I (HIGH SENSITIVITY)    DG Chest Portable 1 View  Final Result      Medications  furosemide (LASIX) injection 40 mg (has no administration in time range)  ipratropium-albuterol (DUONEB) 0.5-2.5 (3) MG/3ML nebulizer solution  3 mL (3 mLs Nebulization Given 01/20/23 2230)     Procedures  /  Critical Care .Critical Care  Performed by: Roxy Horseman, PA-C Authorized by: Roxy Horseman, PA-C   Critical care provider statement:    Critical care time (minutes):  46   Critical care was necessary to treat or prevent imminent or life-threatening deterioration of the following conditions:  Respiratory failure   Critical care was time spent personally by me on the following activities:  Development of treatment plan with patient or surrogate, discussions with consultants, evaluation of patient's response to treatment, examination of patient, ordering and review of laboratory studies, ordering and review of  radiographic studies, ordering and performing treatments and interventions, pulse oximetry, re-evaluation of patient's condition and review of old charts   ED Course and Medical Decision Making  I have reviewed the triage vital signs, the nursing notes, and pertinent available records from the EMR.  Social Determinants Affecting Complexity of Care: Patient has no clinically significant social determinants affecting this chief complaint..   ED Course:    Medical Decision Making Patient here with worsening SOB over the past week.  Increased O2 demand.  7L  at baseline.  There is some wheezing present.  Will give neb and reassess.  RN put patient on NRB due to hypoxia.  Feel that she may benefit from BIPAP for further respiratory support.  She is not retaining, but does have edema on her CXR.  Amount and/or Complexity of Data Reviewed Labs: ordered.    Details: Leukocytosis to 14.9, thought 2/2 prednisone use.  Doubt infection. Trop 42, thought to be demand BNP elevated at 495, this with some edema on CXR, will give lasix COVID 19 negative Radiology: ordered and independent interpretation performed.    Details: Pulmonary vascular congestion present, no large effusion  Risk Prescription drug management. Decision regarding hospitalization.         Consultants: I consulted with Hospitalist, Dr. Allena Katz, who is appreciated for admitting.   Treatment and Plan: Patient's exam and diagnostic results are concerning for respiratory failure with hypoxemia.  Feel that patient will need admission to the hospital for further treatment and evaluation.  Patient seen by and discussed with attending physician, Dr. Lockie Mola, who agrees with plan for Bipap and admission.  Final Clinical Impressions(s) / ED Diagnoses     ICD-10-CM   1. Acute on chronic respiratory failure with hypoxia (HCC)  J96.21       ED Discharge Orders     None         Discharge Instructions Discussed with  and Provided to Patient:   Discharge Instructions   None      Roxy Horseman, PA-C 01/20/23 2348    Virgina Norfolk, DO 01/21/23 1818

## 2023-01-21 ENCOUNTER — Inpatient Hospital Stay (HOSPITAL_COMMUNITY): Payer: Medicare Other

## 2023-01-21 DIAGNOSIS — I5033 Acute on chronic diastolic (congestive) heart failure: Secondary | ICD-10-CM

## 2023-01-21 DIAGNOSIS — K5090 Crohn's disease, unspecified, without complications: Secondary | ICD-10-CM | POA: Diagnosis present

## 2023-01-21 DIAGNOSIS — G4733 Obstructive sleep apnea (adult) (pediatric): Secondary | ICD-10-CM | POA: Diagnosis present

## 2023-01-21 DIAGNOSIS — I110 Hypertensive heart disease with heart failure: Secondary | ICD-10-CM | POA: Diagnosis present

## 2023-01-21 DIAGNOSIS — J4489 Other specified chronic obstructive pulmonary disease: Secondary | ICD-10-CM | POA: Diagnosis present

## 2023-01-21 DIAGNOSIS — D72829 Elevated white blood cell count, unspecified: Secondary | ICD-10-CM

## 2023-01-21 DIAGNOSIS — J849 Interstitial pulmonary disease, unspecified: Secondary | ICD-10-CM

## 2023-01-21 DIAGNOSIS — Z515 Encounter for palliative care: Secondary | ICD-10-CM | POA: Diagnosis not present

## 2023-01-21 DIAGNOSIS — M069 Rheumatoid arthritis, unspecified: Secondary | ICD-10-CM | POA: Diagnosis present

## 2023-01-21 DIAGNOSIS — A0811 Acute gastroenteropathy due to Norwalk agent: Secondary | ICD-10-CM | POA: Diagnosis not present

## 2023-01-21 DIAGNOSIS — E785 Hyperlipidemia, unspecified: Secondary | ICD-10-CM

## 2023-01-21 DIAGNOSIS — M059 Rheumatoid arthritis with rheumatoid factor, unspecified: Secondary | ICD-10-CM

## 2023-01-21 DIAGNOSIS — J9621 Acute and chronic respiratory failure with hypoxia: Secondary | ICD-10-CM | POA: Diagnosis present

## 2023-01-21 DIAGNOSIS — Z981 Arthrodesis status: Secondary | ICD-10-CM | POA: Diagnosis not present

## 2023-01-21 DIAGNOSIS — Z79899 Other long term (current) drug therapy: Secondary | ICD-10-CM | POA: Diagnosis not present

## 2023-01-21 DIAGNOSIS — Z9981 Dependence on supplemental oxygen: Secondary | ICD-10-CM | POA: Diagnosis not present

## 2023-01-21 DIAGNOSIS — Z96641 Presence of right artificial hip joint: Secondary | ICD-10-CM | POA: Diagnosis present

## 2023-01-21 DIAGNOSIS — Z1152 Encounter for screening for COVID-19: Secondary | ICD-10-CM | POA: Diagnosis not present

## 2023-01-21 DIAGNOSIS — F32A Depression, unspecified: Secondary | ICD-10-CM | POA: Diagnosis present

## 2023-01-21 DIAGNOSIS — F341 Dysthymic disorder: Secondary | ICD-10-CM

## 2023-01-21 DIAGNOSIS — E669 Obesity, unspecified: Secondary | ICD-10-CM | POA: Diagnosis present

## 2023-01-21 DIAGNOSIS — Z66 Do not resuscitate: Secondary | ICD-10-CM | POA: Diagnosis present

## 2023-01-21 DIAGNOSIS — E876 Hypokalemia: Secondary | ICD-10-CM | POA: Diagnosis present

## 2023-01-21 DIAGNOSIS — R197 Diarrhea, unspecified: Secondary | ICD-10-CM

## 2023-01-21 DIAGNOSIS — I272 Pulmonary hypertension, unspecified: Secondary | ICD-10-CM

## 2023-01-21 DIAGNOSIS — T380X5A Adverse effect of glucocorticoids and synthetic analogues, initial encounter: Secondary | ICD-10-CM | POA: Diagnosis present

## 2023-01-21 DIAGNOSIS — G8929 Other chronic pain: Secondary | ICD-10-CM | POA: Diagnosis present

## 2023-01-21 DIAGNOSIS — Z96653 Presence of artificial knee joint, bilateral: Secondary | ICD-10-CM | POA: Diagnosis present

## 2023-01-21 DIAGNOSIS — Z7952 Long term (current) use of systemic steroids: Secondary | ICD-10-CM | POA: Diagnosis not present

## 2023-01-21 DIAGNOSIS — F419 Anxiety disorder, unspecified: Secondary | ICD-10-CM | POA: Diagnosis present

## 2023-01-21 LAB — URINALYSIS, COMPLETE (UACMP) WITH MICROSCOPIC
Bacteria, UA: NONE SEEN
Bilirubin Urine: NEGATIVE
Glucose, UA: NEGATIVE mg/dL
Hgb urine dipstick: NEGATIVE
Ketones, ur: 20 mg/dL — AB
Nitrite: NEGATIVE
Protein, ur: NEGATIVE mg/dL
Specific Gravity, Urine: 1.01 (ref 1.005–1.030)
pH: 5 (ref 5.0–8.0)

## 2023-01-21 LAB — C DIFFICILE QUICK SCREEN W PCR REFLEX
C Diff antigen: NEGATIVE
C Diff interpretation: NOT DETECTED
C Diff toxin: NEGATIVE

## 2023-01-21 LAB — CBC
HCT: 34.8 % — ABNORMAL LOW (ref 36.0–46.0)
Hemoglobin: 10.5 g/dL — ABNORMAL LOW (ref 12.0–15.0)
MCH: 28.4 pg (ref 26.0–34.0)
MCHC: 30.2 g/dL (ref 30.0–36.0)
MCV: 94.1 fL (ref 80.0–100.0)
Platelets: 314 10*3/uL (ref 150–400)
RBC: 3.7 MIL/uL — ABNORMAL LOW (ref 3.87–5.11)
RDW: 14.2 % (ref 11.5–15.5)
WBC: 12.4 10*3/uL — ABNORMAL HIGH (ref 4.0–10.5)
nRBC: 0 % (ref 0.0–0.2)

## 2023-01-21 LAB — BASIC METABOLIC PANEL
Anion gap: 16 — ABNORMAL HIGH (ref 5–15)
BUN: 17 mg/dL (ref 8–23)
CO2: 22 mmol/L (ref 22–32)
Calcium: 9.9 mg/dL (ref 8.9–10.3)
Chloride: 95 mmol/L — ABNORMAL LOW (ref 98–111)
Creatinine, Ser: 0.84 mg/dL (ref 0.44–1.00)
GFR, Estimated: >60 mL/min (ref >60)
Glucose, Bld: 57 mg/dL — ABNORMAL LOW (ref 70–99)
Potassium: 4 mmol/L (ref 3.5–5.1)
Sodium: 133 mmol/L — ABNORMAL LOW (ref 135–145)

## 2023-01-21 LAB — TROPONIN I (HIGH SENSITIVITY): Troponin I (High Sensitivity): 55 ng/L — ABNORMAL HIGH (ref <18)

## 2023-01-21 MED ORDER — ACETAMINOPHEN 650 MG RE SUPP: 650.0000 mg | Freq: Four times a day (QID) | RECTAL | Status: DC | PRN

## 2023-01-21 MED ORDER — FLUTICASONE FUROATE-VILANTEROL 100-25 MCG/ACT IN AEPB
1.0000 | INHALATION_SPRAY | Freq: Every day | RESPIRATORY_TRACT | Status: DC
  Filled 2023-01-21: qty 28

## 2023-01-21 MED ORDER — MONTELUKAST SODIUM 10 MG PO TABS
10.0000 mg | ORAL_TABLET | Freq: Every day | ORAL | Status: DC
  Administered 2023-01-21 – 2023-01-24 (×4): 10 mg via ORAL
  Filled 2023-01-21 (×4): qty 1

## 2023-01-21 MED ORDER — OXYCODONE HCL 5 MG PO TABS
10.0000 mg | ORAL_TABLET | Freq: Three times a day (TID) | ORAL | Status: DC
  Administered 2023-01-21 – 2023-01-25 (×14): 10 mg via ORAL
  Filled 2023-01-21 (×14): qty 2

## 2023-01-21 MED ORDER — SENNOSIDES-DOCUSATE SODIUM 8.6-50 MG PO TABS
1.0000 | ORAL_TABLET | Freq: Every evening | ORAL | Status: DC | PRN
  Administered 2023-01-21: 1 via ORAL
  Filled 2023-01-21: qty 1

## 2023-01-21 MED ORDER — TREPROSTINIL 64 MCG IN POWD: 1.0000 | Freq: Four times a day (QID) | RESPIRATORY_TRACT | Status: DC

## 2023-01-21 MED ORDER — UMECLIDINIUM BROMIDE 62.5 MCG/ACT IN AEPB
1.0000 | INHALATION_SPRAY | Freq: Every day | RESPIRATORY_TRACT | Status: DC
  Filled 2023-01-21: qty 7

## 2023-01-21 MED ORDER — ROSUVASTATIN CALCIUM 20 MG PO TABS
20.0000 mg | ORAL_TABLET | Freq: Every day | ORAL | Status: DC
  Administered 2023-01-21 – 2023-01-25 (×5): 20 mg via ORAL
  Filled 2023-01-21 (×5): qty 1

## 2023-01-21 MED ORDER — LORATADINE 10 MG PO TABS
10.0000 mg | ORAL_TABLET | Freq: Every day | ORAL | Status: DC
  Administered 2023-01-21 – 2023-01-25 (×5): 10 mg via ORAL
  Filled 2023-01-21 (×5): qty 1

## 2023-01-21 MED ORDER — TRAZODONE HCL 50 MG PO TABS
100.0000 mg | ORAL_TABLET | Freq: Every day | ORAL | Status: DC
  Administered 2023-01-21 – 2023-01-24 (×4): 100 mg via ORAL
  Filled 2023-01-21 (×5): qty 2

## 2023-01-21 MED ORDER — BUDESONIDE 0.25 MG/2ML IN SUSP
0.2500 mg | Freq: Two times a day (BID) | RESPIRATORY_TRACT | Status: DC
  Administered 2023-01-21 (×2): 0.25 mg via RESPIRATORY_TRACT
  Filled 2023-01-21 (×2): qty 2

## 2023-01-21 MED ORDER — BUDESONIDE 0.5 MG/2ML IN SUSP
0.5000 mg | Freq: Two times a day (BID) | RESPIRATORY_TRACT | Status: DC
  Administered 2023-01-22 – 2023-01-25 (×5): 0.5 mg via RESPIRATORY_TRACT
  Filled 2023-01-21 (×7): qty 2

## 2023-01-21 MED ORDER — ARFORMOTEROL TARTRATE 15 MCG/2ML IN NEBU
15.0000 ug | INHALATION_SOLUTION | Freq: Two times a day (BID) | RESPIRATORY_TRACT | Status: DC
  Administered 2023-01-21 – 2023-01-25 (×7): 15 ug via RESPIRATORY_TRACT
  Filled 2023-01-21 (×9): qty 2

## 2023-01-21 MED ORDER — VERAPAMIL HCL ER 120 MG PO TBCR
120.0000 mg | EXTENDED_RELEASE_TABLET | Freq: Every day | ORAL | Status: DC
  Administered 2023-01-21 – 2023-01-25 (×5): 120 mg via ORAL
  Filled 2023-01-21 (×5): qty 1

## 2023-01-21 MED ORDER — IPRATROPIUM-ALBUTEROL 0.5-2.5 (3) MG/3ML IN SOLN: 3.0000 mL | RESPIRATORY_TRACT | Status: DC | PRN

## 2023-01-21 MED ORDER — ONDANSETRON HCL 4 MG/2ML IJ SOLN
4.0000 mg | Freq: Four times a day (QID) | INTRAMUSCULAR | Status: DC | PRN
  Administered 2023-01-21: 4 mg via INTRAVENOUS
  Filled 2023-01-21: qty 2

## 2023-01-21 MED ORDER — BACITRACIN ZINC 500 UNIT/GM EX OINT
TOPICAL_OINTMENT | Freq: Two times a day (BID) | CUTANEOUS | Status: DC
  Administered 2023-01-21: 1 via TOPICAL
  Administered 2023-01-21 – 2023-01-25 (×8): 31.5 via TOPICAL
  Filled 2023-01-21: qty 0.9
  Filled 2023-01-21: qty 28.4

## 2023-01-21 MED ORDER — ALBUTEROL SULFATE (2.5 MG/3ML) 0.083% IN NEBU: 2.5000 mg | INHALATION_SOLUTION | RESPIRATORY_TRACT | Status: DC | PRN

## 2023-01-21 MED ORDER — SODIUM CHLORIDE 0.9% FLUSH
3.0000 mL | Freq: Two times a day (BID) | INTRAVENOUS | Status: DC
  Administered 2023-01-21 – 2023-01-25 (×10): 3 mL via INTRAVENOUS

## 2023-01-21 MED ORDER — PIRFENIDONE 801 MG PO TABS
801.0000 mg | ORAL_TABLET | Freq: Three times a day (TID) | ORAL | Status: DC
  Administered 2023-01-22 – 2023-01-25 (×10): 801 mg via ORAL
  Filled 2023-01-21 (×11): qty 1

## 2023-01-21 MED ORDER — ACETAMINOPHEN 325 MG PO TABS: 650.0000 mg | ORAL_TABLET | Freq: Four times a day (QID) | ORAL | Status: DC | PRN

## 2023-01-21 MED ORDER — ONDANSETRON HCL 4 MG PO TABS: 4.0000 mg | ORAL_TABLET | Freq: Four times a day (QID) | ORAL | Status: DC | PRN

## 2023-01-21 MED ORDER — ENOXAPARIN SODIUM 40 MG/0.4ML IJ SOSY
40.0000 mg | PREFILLED_SYRINGE | INTRAMUSCULAR | Status: DC
  Administered 2023-01-21 – 2023-01-24 (×4): 40 mg via SUBCUTANEOUS
  Filled 2023-01-21 (×4): qty 0.4

## 2023-01-21 MED ORDER — FUROSEMIDE 10 MG/ML IJ SOLN
40.0000 mg | Freq: Every day | INTRAMUSCULAR | Status: DC
  Administered 2023-01-21 – 2023-01-23 (×3): 40 mg via INTRAVENOUS
  Filled 2023-01-21 (×3): qty 4

## 2023-01-21 MED ORDER — LEFLUNOMIDE 10 MG PO TABS
20.0000 mg | ORAL_TABLET | Freq: Every day | ORAL | Status: DC
  Administered 2023-01-21 – 2023-01-23 (×3): 20 mg via ORAL
  Filled 2023-01-21 (×7): qty 2

## 2023-01-21 MED ORDER — PANTOPRAZOLE SODIUM 40 MG PO TBEC
40.0000 mg | DELAYED_RELEASE_TABLET | Freq: Every day | ORAL | Status: DC
  Administered 2023-01-21 – 2023-01-25 (×5): 40 mg via ORAL
  Filled 2023-01-21 (×5): qty 1

## 2023-01-21 MED ORDER — METHYLPREDNISOLONE SODIUM SUCC 125 MG IJ SOLR
80.0000 mg | Freq: Every day | INTRAMUSCULAR | Status: DC
  Administered 2023-01-21 – 2023-01-22 (×2): 80 mg via INTRAVENOUS
  Filled 2023-01-21 (×2): qty 2

## 2023-01-21 MED ORDER — REVEFENACIN 175 MCG/3ML IN SOLN
175.0000 ug | Freq: Every day | RESPIRATORY_TRACT | Status: DC
  Administered 2023-01-23 – 2023-01-25 (×3): 175 ug via RESPIRATORY_TRACT
  Filled 2023-01-21 (×4): qty 3

## 2023-01-21 MED ORDER — FLUOXETINE HCL 20 MG PO CAPS
20.0000 mg | ORAL_CAPSULE | Freq: Every day | ORAL | Status: DC
  Administered 2023-01-21 – 2023-01-25 (×5): 20 mg via ORAL
  Filled 2023-01-21 (×5): qty 1

## 2023-01-21 NOTE — ED Notes (Signed)
Pt refuses assessment at this time but admits to having "bedsores for 3 weeks now"

## 2023-01-21 NOTE — Hospital Course (Signed)
[name] is a 77 y.o. female with medical history significant for ILD/pulmonary fibrosis, chronic hypoxic respiratory failure on 7 L O2 via [place] at baseline, PAH, HFpEF, RA on chronic prednisone 5 mg qd, OSA on CPAP who is admitted with acute on chronic hypoxic respiratory failure due to ILD and HFpEF exacerbations.

## 2023-01-21 NOTE — ED Notes (Signed)
RT at bedside.

## 2023-01-21 NOTE — Consult Note (Signed)
NAME:  [name], MRN:  [id number], DOB:  1946-01-16, LOS: 0 ADMISSION DATE:  01/20/2023, CONSULTATION DATE:  01/21/2023 REFERRING MD:  Dr. Janee Morn, [facility], CHIEF COMPLAINT:  Short of breath   History of Present Illness:  77 yo female with hx of RA, Crohn's disease, ILD, Pulmonary hypertension, OSA and chronic respiratory failure is followed in pulmonary clinic by [name].  She presented to [facility] ER on 7/05 with increased shortness of breath for one week.  She had her prednisone increased to 20 mg daily with initial improvement, but then her breathing got worse.  She has cough with green sputum and feels more fatigued.  CXR showed vascular congestion and she was treated with lasix.  Pertinent  Medical History  Asthma, Allergies, Anxiety, Arthritis, GERD, HTN, IBS, Rheumatoid arthritis, Sleep apnea, Crohn's disease, ILD, OSA, Chronic respiratory failure  Significant Hospital Events:   7/05 Admit  Studies:  HRCT chest 05/16/21 >> widespread patchy GGO, septal thickening, subpleural reticulation, regional BTX, air trapping, tracheomalacia RHC 12/17/21 >> RA 7, RV 62/12, PA 63.17 (36), PCW 11, FI 3.8, PRV 3.5 WU PFT 06/07/22 >> FEV1 1.13 (65%), FVC 1.70 (63%), DLCO 36% Echo 11/23/22 >> EF 55 to 60%, grade 1 DD, mod elevation in PASP  Interim History / Subjective:  She started feeling more short of breath about a week ago.  She stopped using tyvaso recently because it was irritating her throat and making her cough.  She didn't feel like it was helping and it is expensive.  She was getting more swelling in her legs.  No fever or chest pain.  Feels better since she came to the hospital.  She is very weak and has trouble standing.  Objective   Blood pressure 133/80, pulse (!) 102, temperature 97.6 F (36.4 C), resp. rate 20, height 5' (1.524 m), weight 86.2 kg, SpO2 95 %.    FiO2 (%):  [40 %-60 %] 40 % PEEP:  [5 cmH20] 5 cmH20 Pressure Support:  [5 cmH20-10 cmH20] 5 cmH20   Intake/Output  Summary (Last 24 hours) at 01/21/2023 1327 Last data filed at 01/21/2023 1144 Gross per 24 hour  Intake 3 ml  Output 500 ml  Net -497 ml   Filed Weights   01/21/23 0654  Weight: 86.2 kg    Examination:  General - alert Eyes - pupils reactive ENT - no sinus tenderness, no stridor Cardiac - regular rate/rhythm, no murmur Chest - b/l crackles Abdomen - soft, non tender, + bowel sounds Extremities - decreased muscle bulk, minimal ankle edema Skin - dressing on Lt lower leg clean Neuro - normal strength, moves extremities, follows commands Psych - normal mood and behavior   Resolved Hospital Problem list     Assessment & Plan:   Acute on chronic respiratory failure. - continue supplemental oxygen to keep SpO2 88 to 95%  Interstitial lung disease. - continue pirfenidone; okay for her to use her home medication  Acute on chronic HFpEF. WHO group 1, 2 and 3 pulmonary hypertension. - she hasn't been able to tolerate tyvaso; hold this for now  - repeat Echo and compare to most recent one from 11/23/22 - continue lasix  Rheumatoid arthritis. - followed by Dr. Zenovia Jordan with rheumatology - she will use her home leflunomide - likely can transition from solumedrol to prednisone soon; use 5 mg prednisone daily on chronic basis  Obstructive sleep apnea with tracheobronchomalacia. - CPAP at bedtime  Asthma. - yupelri, brovana, pulmicort - prn albuterol  Goals  of care. - DNR/DNI   Labs   CBC: Recent Labs  Lab 01/20/23 2206 01/20/23 2211 01/21/23 0033  WBC 14.9*  --  12.4*  HGB 10.6* 11.9* 10.5*  HCT 34.7* 35.0* 34.8*  MCV 93.3  --  94.1  PLT 344  --  314    Basic Metabolic Panel: Recent Labs  Lab 01/20/23 2206 01/20/23 2211 01/21/23 0033  NA 131* 128* 133*  K 3.9 3.9 4.0  CL 93*  --  95*  CO2 23  --  22  GLUCOSE 85  --  57*  BUN 16  --  17  CREATININE 0.81  --  0.84  CALCIUM 10.2  --  9.9   GFR: Estimated Creatinine Clearance: 53.9 mL/min (by C-G  formula based on SCr of 0.84 mg/dL). Recent Labs  Lab 01/20/23 2206 01/21/23 0033  WBC 14.9* 12.4*    Liver Function Tests: No results for input(s): "AST", "ALT", "ALKPHOS", "BILITOT", "PROT", "ALBUMIN" in the last 168 hours. No results for input(s): "LIPASE", "AMYLASE" in the last 168 hours. No results for input(s): "AMMONIA" in the last 168 hours.  ABG    Component Value Date/Time   PHART 7.340 (L) 12/17/2021 0807   PCO2ART 53.2 (H) 12/17/2021 0807   PO2ART 111 (H) 12/17/2021 0807   HCO3 22.9 01/20/2023 2211   TCO2 24 01/20/2023 2211   ACIDBASEDEF 1.0 01/20/2023 2211   O2SAT 83 01/20/2023 2211     Coagulation Profile: No results for input(s): "INR", "PROTIME" in the last 168 hours.  Cardiac Enzymes: No results for input(s): "CKTOTAL", "CKMB", "CKMBINDEX", "TROPONINI" in the last 168 hours.  HbA1C: Hgb A1c MFr Bld  Date/Time Value Ref Range Status  11/15/2012 03:07 PM 6.0 (H) 4.8 - 5.6 % Final    Comment:             Increased risk for diabetes: 5.7 - 6.4          Diabetes: >6.4          Glycemic control for adults with diabetes: <7.0    CBG: No results for input(s): "GLUCAP" in the last 168 hours.  Review of Systems:   Reviewed and negative  Past Medical History:  She,  has a past medical history of Acute asthmatic bronchitis, Allergic rhinitis, Anxiety, Arthritis, Esophageal reflux, Hypertension, Interstitial lung disease (HCC) (dx jan 2020), Irritable bowel syndrome, Oxygen dependent, PONV (postoperative nausea and vomiting), Pulmonary hypertension (HCC), Rheumatoid arthritis(714.0), and Sleep apnea.   Surgical History:   Past Surgical History:  Procedure Laterality Date    c secttion  1977   2 foot fusions Left    total of 6 left foot sx   ABDOMINAL HYSTERECTOMY     complete    ANKLE FUSION  2009   left   BACK SURGERY     lower l to l 5 fused   CHOLECYSTECTOMY     RIGHT HEART CATH N/A 11/09/2020   Procedure: RIGHT HEART CATH;  Surgeon: Dolores Patty, MD;  Location: [facility] INVASIVE CV LAB;  Service: Cardiovascular;  Laterality: N/A;   RIGHT/LEFT HEART CATH AND CORONARY ANGIOGRAPHY N/A 09/16/2016   Procedure: Right/Left Heart Cath and Coronary Angiography;  Surgeon: [name], MD;  Location: [facility] INVASIVE CV LAB;  Service: Cardiovascular;  Laterality: N/A;   RIGHT/LEFT HEART CATH AND CORONARY ANGIOGRAPHY N/A 12/17/2021   Procedure: RIGHT/LEFT HEART CATH AND CORONARY ANGIOGRAPHY;  Surgeon: Dolores Patty, MD;  Location: [facility] INVASIVE CV LAB;  Service: Cardiovascular;  Laterality: N/A;  TONSILLECTOMY     TOTAL HIP ARTHROPLASTY Right 12/20/2018   Procedure: TOTAL HIP ARTHROPLASTY ANTERIOR APPROACH;  Surgeon: Durene Romans, MD;  Location: [facility] ORS;  Service: Orthopedics;  Laterality: Right;  70 mins   TOTAL KNEE ARTHROPLASTY Bilateral      Social History:   reports that she has never smoked. She has never used smokeless tobacco. She reports that she does not currently use alcohol after a past usage of about 7.0 standard drinks of alcohol per week. She reports that she does not use drugs.   Family History:  Her family history includes Arthritis in her mother; Diabetes in an other family member; Heart attack in her father and another family member; Heart disease in her mother.   Allergies Allergies  Allergen Reactions   Cefdinir Diarrhea   Erythromycin Base Diarrhea   Lactose Diarrhea   Lactulose Diarrhea   Mesalamine Nausea Only   Methotrexate Other (See Comments)    Felt sick   Nitrofuran Derivatives     shakiness   Other Diarrhea and Other (See Comments)    Shaking uncontrollably "lettuce only"    Sulfa Antibiotics Other (See Comments)    Patient can't recall reaction, but made her feel "really sick"    Tdap [Tetanus-Diphth-Acell Pertussis]     Shaking uncontrollably   Tetanus Toxoid, Adsorbed Other (See Comments)    Shaking uncontrollable      Home Medications  Prior to Admission medications   Medication Sig Start  Date End Date Taking? Authorizing Provider  albuterol (VENTOLIN HFA) 108 (90 Base) MCG/ACT inhaler USE 2 INHALATIONS BY MOUTH EVERY 6 HOURS AS NEEDED Patient taking differently: Inhale 2 puffs into the lungs every 6 (six) hours as needed for wheezing or shortness of breath. 11/23/22  Yes Kalman Shan, MD  aspirin EC 81 MG tablet Take 81 mg by mouth daily.   Yes [provider]  benzonatate (TESSALON) 200 MG capsule TAKE 1 CAPSULE (200 MG TOTAL) BY MOUTH 3 (THREE) TIMES DAILY AS NEEDED FOR COUGH. Patient taking differently: Take 200 mg by mouth daily. 11/30/22  Yes Kalman Shan, MD  cetirizine (ZYRTEC) 10 MG tablet Take 20 mg by mouth daily.   Yes [provider]  Cholecalciferol (VITAMIN D3) 250 MCG (10000 UT) capsule Take 10,000 Units by mouth daily.   Yes [provider]  clidinium-chlordiazePOXIDE (LIBRAX) 5-2.5 MG capsule Take 1 capsule by mouth 3 (three) times daily as needed (for IBS).   Yes [provider]  colestipol (COLESTID) 1 g tablet Take 1 g by mouth daily.   Yes [provider]  dicyclomine (BENTYL) 20 MG tablet Take 20 mg by mouth 2 (two) times daily as needed (IBS).   Yes [provider]  ESBRIET 801 MG TABS Take 1 tablet (801 mg total) by mouth 3 (three) times daily. 11/07/22  Yes Kalman Shan, MD  FLUoxetine (PROZAC) 20 MG capsule Take 20 mg by mouth daily. 08/11/16  Yes [provider]  fluticasone (FLONASE) 50 MCG/ACT nasal spray SPRAY 2 SPRAYS INTO EACH NOSTRIL EVERY DAY Patient taking differently: Place 2 sprays into both nostrils daily. 04/01/20  Yes Coral Ceo, NP  furosemide (LASIX) 80 MG tablet Take 1 tablet (80 mg total) by mouth daily. Patient taking differently: Take 40 mg by mouth daily. 09/15/22  Yes End, Cristal Deer, MD  ipratropium (ATROVENT) 0.03 % nasal spray USE 1 TO 2 SPRAYS IN BOTH  NOSTRILS TWICE DAILY 05/30/22  Yes Kalman Shan, MD  ipratropium-albuterol (DUONEB) 0.5-2.5 (3)  MG/3ML SOLN USE 1 VIAL IN NEBULIZER EVERY 6 HOURS AS NEEDED J45.901 Patient taking differently: Take 3 mLs by nebulization at bedtime. 09/19/22  Yes Kalman Shan, MD  leflunomide (ARAVA) 20 MG tablet Take 1 tablet (20 mg total) by mouth daily. 05/17/22  Yes Zannie Cove, MD  loperamide (IMODIUM) 2 MG capsule Take 1 capsule (2 mg total) by mouth 2 (two) times daily as needed for diarrhea or loose stools. 05/03/22  Yes Zannie Cove, MD  Menthol, Topical Analgesic, (BIOFREEZE COOL THE PAIN) 4 % GEL Apply 1 Application topically 2 (two) times daily as needed (pain).   Yes [provider]  methocarbamol (ROBAXIN) 750 MG tablet Take 750 mg by mouth at bedtime. for muscle spasms 07/22/20  Yes [provider]  montelukast (SINGULAIR) 10 MG tablet TAKE 1 TABLET BY MOUTH AT  BEDTIME 05/30/22  Yes Kalman Shan, MD  omeprazole (PRILOSEC) 40 MG capsule Take 40 mg by mouth at bedtime.    Yes [provider]  Oxycodone HCl 10 MG TABS Take 10 mg by mouth 3 (three) times daily. 05/30/22  Yes [provider]  OXYGEN Inhale 7 L into the lungs continuous.   Yes [provider]  Polyethyl Glycol-Propyl Glycol (SYSTANE) 0.4-0.3 % GEL ophthalmic gel Place 2 application  into both eyes 2 (two) times daily as needed (dry eyes).   Yes [provider]  potassium chloride SA (KLOR-CON M) 20 MEQ tablet TAKE 1 TABLET BY MOUTH TWICE  DAILY 09/19/22  Yes End, Cristal Deer, MD  predniSONE (DELTASONE) 5 MG tablet Take 5-20 mg by mouth See admin instructions. Take 20mg  by mouth for 8 days, then resume taking 5mg  daily on 9th day   Yes [provider]  rosuvastatin (CRESTOR) 20 MG tablet TAKE 1 TABLET BY MOUTH DAILY 11/22/22  Yes End, Cristal Deer, MD  traZODone (DESYREL) 100 MG tablet Take 100 mg by mouth at bedtime.   Yes [provider]  TYVASO DPI MAINTENANCE KIT 64 MCG POWD Inhale 1 puff into the lungs in the morning, at noon, in the evening, and at  bedtime. 12/15/22  Yes Kalman Shan, MD  verapamil (CALAN-SR) 120 MG CR tablet Take 1 tablet (120 mg total) by mouth daily. 09/19/22  Yes End, Cristal Deer, MD  Tocilizumab (ACTEMRA IV) Inject into the vein every 30 (thirty) days. infusion Patient not taking: Reported on 11/01/2022    [provider]  TRELEGY ELLIPTA 100-62.5-25 MCG/ACT AEPB USE 1 INHALATION BY MOUTH ONCE  DAILY AT THE SAME TIME EACH DAY Patient taking differently: Inhale 1 puff into the lungs daily. 09/22/22   Kalman Shan, MD     Signature:  Coralyn Helling, MD [place] [facility] Pager - [phone] or [phone] 01/21/2023, 2:47 PM

## 2023-01-21 NOTE — ED Notes (Signed)
ED TO INPATIENT HANDOFF REPORT  ED Nurse Name and Phone #: Dot Lanes, Paramedic [phone]  S Name/Age/Gender [name] 77 y.o. female Room/Bed: 021C/021C  Code Status   Code Status: DNR  Home/SNF/Other Home Patient oriented to: self, place, time, and situation Is this baseline? Yes   Triage Complete: Triage complete  Chief Complaint Acute on chronic hypoxic respiratory failure (HCC) [J96.21]  Triage Note Pt bibgcems from home. Pt c/o sob she's had for about a week and a half. Pt has pulmonary fibroses and chf. On 7 L at baseline. Was prescribed prednisone Monday. Upper lobes clear with ems, lower lobes with rhonchi. Pt c/o fatigue, decrease in appetite and decrease in mobility.  Vitals 160/110 Hr 104  91% 7 L  88 cbg   Allergies Allergies  Allergen Reactions   Cefdinir Diarrhea   Erythromycin Base Diarrhea   Lactose Diarrhea   Lactulose Diarrhea   Mesalamine Nausea Only   Methotrexate Other (See Comments)    Felt sick   Nitrofuran Derivatives     shakiness   Other Diarrhea and Other (See Comments)    Shaking uncontrollably "lettuce only"    Sulfa Antibiotics Other (See Comments)    Patient can't recall reaction, but made her feel "really sick"    Tdap [Tetanus-Diphth-Acell Pertussis]     Shaking uncontrollably   Tetanus Toxoid, Adsorbed Other (See Comments)    Shaking uncontrollable     Level of Care/Admitting Diagnosis ED Disposition     ED Disposition  Admit   Condition  --   Comment  Hospital Area: [facility] [[id number]]  Level of Care: Progressive [102]  Admit to Progressive based on following criteria: RESPIRATORY PROBLEMS hypoxemic/hypercapnic respiratory failure that is responsive to NIPPV (BiPAP) or High Flow Nasal Cannula (6-80 lpm). Frequent assessment/intervention, no > Q2 hrs < Q4 hrs, to maintain oxygenation and pulmonary hygiene.  May admit patient to [facility] or [facility] if equivalent level of care is available::  No  Covid Evaluation: Confirmed COVID Negative  Diagnosis: Acute on chronic hypoxic respiratory failure [facility]) [[id number]]  Admitting Physician: Charlsie Quest [[id number]]  Attending Physician: Charlsie Quest [[id number]]  Certification:: I certify this patient will need inpatient services for at least 2 midnights  Estimated Length of Stay: 2          B Medical/Surgery History Past Medical History:  Diagnosis Date   Acute asthmatic bronchitis    Allergic rhinitis    Anxiety    Arthritis    Esophageal reflux    Hypertension    Interstitial lung disease (HCC) dx jan 2020   Irritable bowel syndrome    Oxygen dependent    4 liters day time 6 liters at night   PONV (postoperative nausea and vomiting)    ponv likes zofran, and scopolamine patch   Pulmonary hypertension (HCC)    Rheumatoid arthritis(714.0)    Sleep apnea    Past Surgical History:  Procedure Laterality Date    c secttion  1977   2 foot fusions Left    total of 6 left foot sx   ABDOMINAL HYSTERECTOMY     complete    ANKLE FUSION  2009   left   BACK SURGERY     lower l to l 5 fused   CHOLECYSTECTOMY     RIGHT HEART CATH N/A 11/09/2020   Procedure: RIGHT HEART CATH;  Surgeon: Dolores Patty, MD;  Location: [facility] INVASIVE CV LAB;  Service: Cardiovascular;  Laterality: N/A;  RIGHT/LEFT HEART CATH AND CORONARY ANGIOGRAPHY N/A 09/16/2016   Procedure: Right/Left Heart Cath and Coronary Angiography;  Surgeon: [name], MD;  Location: [facility] INVASIVE CV LAB;  Service: Cardiovascular;  Laterality: N/A;   RIGHT/LEFT HEART CATH AND CORONARY ANGIOGRAPHY N/A 12/17/2021   Procedure: RIGHT/LEFT HEART CATH AND CORONARY ANGIOGRAPHY;  Surgeon: Dolores Patty, MD;  Location: [facility] INVASIVE CV LAB;  Service: Cardiovascular;  Laterality: N/A;   TONSILLECTOMY     TOTAL HIP ARTHROPLASTY Right 12/20/2018   Procedure: TOTAL HIP ARTHROPLASTY ANTERIOR APPROACH;  Surgeon: Durene Romans, MD;  Location: [facility] ORS;  Service: Orthopedics;   Laterality: Right;  70 mins   TOTAL KNEE ARTHROPLASTY Bilateral      A IV Location/Drains/Wounds Patient Lines/Drains/Airways Status     Active Line/Drains/Airways     Name Placement date Placement time Site Days   Peripheral IV 01/20/23 18 G 1.16" Anterior;Right Forearm 01/20/23  2215  Forearm  1            Intake/Output Last 24 hours  Intake/Output Summary (Last 24 hours) at 01/21/2023 1201 Last data filed at 01/21/2023 1144 Gross per 24 hour  Intake 3 ml  Output 500 ml  Net -497 ml    Labs/Imaging Results for orders placed or performed during the hospital encounter of 01/20/23 (from the past 48 hour(s))  Basic metabolic panel     Status: Abnormal   Collection Time: 01/20/23 10:06 PM  Result Value Ref Range   Sodium 131 (L) 135 - 145 mmol/L   Potassium 3.9 3.5 - 5.1 mmol/L   Chloride 93 (L) 98 - 111 mmol/L   CO2 23 22 - 32 mmol/L   Glucose, Bld 85 70 - 99 mg/dL    Comment: Glucose reference range applies only to samples taken after fasting for at least 8 hours.   BUN 16 8 - 23 mg/dL   Creatinine, Ser 1.61 0.44 - 1.00 mg/dL   Calcium 09.6 8.9 - 04.5 mg/dL   GFR, Estimated >40 >98 mL/min    Comment: (NOTE) Calculated using the CKD-EPI Creatinine Equation (2021)    Anion gap 15 5 - 15    Comment: Performed at [facility], [address] [place], [place] [address]  CBC     Status: Abnormal   Collection Time: 01/20/23 10:06 PM  Result Value Ref Range   WBC 14.9 (H) 4.0 - 10.5 K/uL   RBC 3.72 (L) 3.87 - 5.11 MIL/uL   Hemoglobin 10.6 (L) 12.0 - 15.0 g/dL   HCT 78.2 (L) 95.6 - 21.3 %   MCV 93.3 80.0 - 100.0 fL   MCH 28.5 26.0 - 34.0 pg   MCHC 30.5 30.0 - 36.0 g/dL   RDW 08.6 57.8 - 46.9 %   Platelets 344 150 - 400 K/uL   nRBC 0.2 0.0 - 0.2 %    Comment: Performed at [facility] Lab, [address], [place], [place] [address]  Troponin I (High Sensitivity)     Status: Abnormal   Collection Time: 01/20/23 10:06 PM  Result Value Ref Range   Troponin I  (High Sensitivity) 42 (H) <18 ng/L    Comment: (NOTE) Elevated high sensitivity troponin I (hsTnI) values and significant  changes across serial measurements may suggest ACS but many other  chronic and acute conditions are known to elevate hsTnI results.  Refer to the "Links" section for chest pain algorithms and additional  guidance. Performed at [facility], [address], [place], [place] [address]  Brain natriuretic peptide     Status: Abnormal   Collection Time: 01/20/23 10:06 PM  Result Value Ref Range   B Natriuretic Peptide 495.9 (H) 0.0 - 100.0 pg/mL    Comment: Performed at [facility] Lab, [address], [place], [place] [address]  SARS Coronavirus 2 by RT PCR (hospital order, performed in [facility] hospital lab) *cepheid single result test* Anterior Nasal Swab     Status: None   Collection Time: 01/20/23 10:06 PM   Specimen: Anterior Nasal Swab  Result Value Ref Range   SARS Coronavirus 2 by RT PCR NEGATIVE NEGATIVE    Comment: Performed at [facility] Lab, [address] [place], [place] [address]  I-Stat venous blood gas, [facility] ED, [facility], [facility])     Status: Abnormal   Collection Time: 01/20/23 10:11 PM  Result Value Ref Range   pH, Ven 7.430 7.25 - 7.43   pCO2, Ven 34.5 (L) 44 - 60 mmHg   pO2, Ven 46 (H) 32 - 45 mmHg   Bicarbonate 22.9 20.0 - 28.0 mmol/L   TCO2 24 22 - 32 mmol/L   O2 Saturation 83 %   Acid-base deficit 1.0 0.0 - 2.0 mmol/L   Sodium 128 (L) 135 - 145 mmol/L   Potassium 3.9 3.5 - 5.1 mmol/L   Calcium, Ion 0.90 (L) 1.15 - 1.40 mmol/L   HCT 35.0 (L) 36.0 - 46.0 %   Hemoglobin 11.9 (L) 12.0 - 15.0 g/dL   Sample type VENOUS   CBC     Status: Abnormal   Collection Time: 01/21/23 12:33 AM  Result Value Ref Range   WBC 12.4 (H) 4.0 - 10.5 K/uL   RBC 3.70 (L) 3.87 - 5.11 MIL/uL   Hemoglobin 10.5 (L) 12.0 - 15.0 g/dL   HCT 82.9 (L) 56.2 - 13.0 %   MCV 94.1 80.0 - 100.0 fL   MCH 28.4 26.0 - 34.0 pg   MCHC 30.2 30.0 - 36.0 g/dL   RDW 86.5  78.4 - 69.6 %   Platelets 314 150 - 400 K/uL   nRBC 0.0 0.0 - 0.2 %    Comment: Performed at [facility] Lab, [address], [place], [place] [address]  Basic metabolic panel     Status: Abnormal   Collection Time: 01/21/23 12:33 AM  Result Value Ref Range   Sodium 133 (L) 135 - 145 mmol/L   Potassium 4.0 3.5 - 5.1 mmol/L   Chloride 95 (L) 98 - 111 mmol/L   CO2 22 22 - 32 mmol/L   Glucose, Bld 57 (L) 70 - 99 mg/dL    Comment: Glucose reference range applies only to samples taken after fasting for at least 8 hours.   BUN 17 8 - 23 mg/dL   Creatinine, Ser 4.13 0.44 - 1.00 mg/dL   Calcium 9.9 8.9 - 24.4 mg/dL   GFR, Estimated >01 >02 mL/min    Comment: (NOTE) Calculated using the CKD-EPI Creatinine Equation (2021)    Anion gap 16 (H) 5 - 15    Comment: Performed at [facility] Lab, [address] [place], [place] [address]  Troponin I (High Sensitivity)     Status: Abnormal   Collection Time: 01/21/23 12:33 AM  Result Value Ref Range   Troponin I (High Sensitivity) 55 (H) <18 ng/L    Comment: (NOTE) Elevated high sensitivity troponin I (hsTnI) values and significant  changes across serial measurements may suggest ACS but many other  chronic and acute conditions  are known to elevate hsTnI results.  Refer to the "Links" section for chest pain algorithms and additional  guidance. Performed at [facility], [address], [place], [place] [address]    DG Chest Portable 1 View  Result Date: 01/20/2023 CLINICAL DATA:  Shortness of breath EXAM: PORTABLE CHEST 1 VIEW COMPARISON:  04/20/2022 FINDINGS: Cardiomegaly with vascular congestion. Perihilar and infrahilar opacities could reflect edema. Peripheral coarsened opacities compatible with fibrosis as seen on prior CT. No effusions. Aortic atherosclerosis. No acute bony abnormality. IMPRESSION: Cardiomegaly with vascular congestion and perihilar opacities which could reflect pulmonary edema. Chronic changes/fibrosis.  Electronically Signed   By: Charlett Nose M.D.   On: 01/20/2023 22:44    Pending Labs Unresulted Labs (From admission, onward)     Start     Ordered   01/21/23 1120  Gastrointestinal Panel by PCR , Stool  (Gastrointestinal Panel by PCR, Stool                                                                                                                                                     **Does Not include CLOSTRIDIUM DIFFICILE testing. **If CDIFF testing is needed, place order from the "C Difficile Testing" order set.**)  Once,   R        01/21/23 1119   01/21/23 1120  C Difficile Quick Screen w PCR reflex  (C Difficile quick screen w PCR reflex panel )  Once, for 24 hours,   TIMED       References:    CDiff Information Tool   01/21/23 1119            Vitals/Pain Today's Vitals   01/21/23 0930 01/21/23 1000 01/21/23 1119 01/21/23 1200  BP: 133/74 119/82  127/72  Pulse: 93 97  92  Resp: (!) 22     Temp: (!) 97.4 F (36.3 C)     TempSrc: Oral     SpO2: 96% 98%  97%  Weight:      Height:      PainSc: 2   0-No pain     Isolation Precautions Enteric precautions (UV disinfection)  Medications Medications  enoxaparin (LOVENOX) injection 40 mg (has no administration in time range)  sodium chloride flush (NS) 0.9 % injection 3 mL (3 mLs Intravenous Given 01/21/23 1144)  acetaminophen (TYLENOL) tablet 650 mg (has no administration in time range)    Or  acetaminophen (TYLENOL) suppository 650 mg (has no administration in time range)  ondansetron (ZOFRAN) tablet 4 mg (has no administration in time range)    Or  ondansetron (ZOFRAN) injection 4 mg (has no administration in time range)  senna-docusate (Senokot-S) tablet 1 tablet (1 tablet Oral Given 01/21/23 1144)  ipratropium-albuterol (DUONEB) 0.5-2.5 (3) MG/3ML nebulizer solution 3 mL (has no administration in time range)  arformoterol (BROVANA) nebulizer solution  15 mcg (15 mcg Nebulization Given 01/21/23 0803)  budesonide  (PULMICORT) nebulizer solution 0.25 mg (0.25 mg Nebulization Given 01/21/23 0803)  methylPREDNISolone sodium succinate (SOLU-MEDROL) 125 mg/2 mL injection 80 mg (80 mg Intravenous Given 01/21/23 0024)  Pirfenidone TABS 801 mg (has no administration in time range)  FLUoxetine (PROZAC) capsule 20 mg (20 mg Oral Given 01/21/23 1136)  leflunomide (ARAVA) tablet 20 mg (has no administration in time range)  montelukast (SINGULAIR) tablet 10 mg (has no administration in time range)  oxyCODONE (Oxy IR/ROXICODONE) immediate release tablet 10 mg (10 mg Oral Given 01/21/23 0940)  rosuvastatin (CRESTOR) tablet 20 mg (20 mg Oral Given 01/21/23 1137)  fluticasone furoate-vilanterol (BREO ELLIPTA) 100-25 MCG/ACT 1 puff (1 puff Inhalation Not Given 01/21/23 0826)    And  umeclidinium bromide (INCRUSE ELLIPTA) 62.5 MCG/ACT 1 puff (1 puff Inhalation Not Given 01/21/23 0827)  Treprostinil POWD 1 puff (has no administration in time range)  pantoprazole (PROTONIX) EC tablet 40 mg (40 mg Oral Given 01/21/23 0941)  furosemide (LASIX) injection 40 mg (40 mg Intravenous Given 01/21/23 0942)  loratadine (CLARITIN) tablet 10 mg (10 mg Oral Given 01/21/23 0941)  bacitracin ointment (1 Application Topical Given 01/21/23 1145)  ipratropium-albuterol (DUONEB) 0.5-2.5 (3) MG/3ML nebulizer solution 3 mL (3 mLs Nebulization Given 01/20/23 2230)  furosemide (LASIX) injection 40 mg (40 mg Intravenous Given 01/21/23 0023)    Mobility non-ambulatory     Focused Assessments     R Recommendations: See Admitting Provider Note  Report given to:   Additional Notes:

## 2023-01-21 NOTE — Progress Notes (Signed)
Pt placed back on BiPAP. Pt facemask is loose causing a leak because the pt refused to allow RT to tighten straps any further. Pt tolerating BiPAP at this time.  01/21/23 0130  BiPAP/CPAP/SIPAP  $ Non-Invasive Ventilator  Non-Invasive Vent Subsequent  BiPAP/CPAP/SIPAP Pt Type Adult  BiPAP/CPAP/SIPAP SERVO  Mask Type Full face mask  Mask Size Medium  Respiratory Rate 29 breaths/min  Pressure Support 10 cmH20  PEEP 5 cmH20  FiO2 (%) 60 %  Minute Ventilation 18.2  Leak 50 (Pt refused to let RT tighten straps further.)  Peak Inspiratory Pressure (PIP) 15  Tidal Volume (Vt) 462  Patient Home Equipment No  Auto Titrate No  Press High Alarm 35 cmH2O  Press Low Alarm 8 cmH2O  Nasal massage performed Yes  BiPAP/CPAP /SiPAP Vitals  Pulse Rate (!) 104  Resp (!) 24  BP 130/76  SpO2 98 %  Bilateral Breath Sounds Diminished  MEWS Score/Color  MEWS Score 3  MEWS Score Color Yellow

## 2023-01-21 NOTE — Progress Notes (Signed)
RT called into ED21 at 0005 to remove patient from BiPAP because she could not tolerate wearing the mask. MD in room and gave verbal to place her back on [facility] and try to keep her above 90%. BiPAP removed and patient O2 sat of 90% on 7L.

## 2023-01-21 NOTE — Progress Notes (Signed)
PROGRESS NOTE    [name]  ZOX:[id number] DOB: 1945/11/28 DOA: 01/20/2023 PCP: Burton Apley, MD    Chief Complaint  Patient presents with   Shortness of Breath         Brief Narrative:  Patient is a 77 year old female history of pulmonary fibrosis, PAH, HFpEF, RA on chronic prednisone 5 mg daily, chronic hypoxic respiratory failure on 7 L O2 via nasal cannula at baseline, OSA on CPAP presented to the ED with acute on chronic respiratory secondary to ILD and CHF exacerbation.  Pulmonary consulted.   Assessment & Plan:   Principal Problem:   Acute on chronic hypoxic respiratory failure (HCC) Active Problems:   Acute on chronic heart failure with preserved ejection fraction (HFpEF) (HCC)   ILD (interstitial lung disease) (HCC)   Pulmonary hypertension (HCC)   Hyperlipidemia LDL goal <70   ANXIETY DEPRESSION   Rheumatoid arthritis (HCC)   Diarrhea  #1 acute on chronic hypoxic respiratory failure secondary to ILD/pulmonary fibrosis and HFpEF exacerbation -Acute presentation multifactorial secondary to ILD and HFpEF exacerbation. -Patient noted to have decreased home dose of Lasix from 80 mg daily to 40 mg daily as she was unable to make it to the restroom in time. -BNP elevated on admission. -Patient on clinical exam had diffuse crackles and on admission noted to have some wheezing as well. -Patient initially was placed on a nonrebreather and subsequently placed on the BiPAP which she is tolerated overnight and states improved with her breathing. -Trial of BiPAP this morning and will likely need to be placed on BiPAP at bedtime. -Goal sats 88 to 92%. -Continue IV Solu-Medrol 80 mg daily, scheduled Brovana and Pulmicort, DuoNebs as needed, Trelegy Ellipta, Incruse, Singulair. -Wean O2 back to home regimen of 7 L O2 via nasal cannula. -Add PPI, Claritin. -Due to patient's complex pulmonary history will consult with pulmonary for further evaluation and management.  2.   Diarrhea -Patient with complaints of diarrhea after 3 days of antibiotics ongoing for approximately a week. -Checking C. difficile PCR, GI pathogen panel. -If C. difficile PCR and GI pathogen panel are negative will place on Imodium as needed.  3.  Leukocytosis -Likely secondary to recent steroid use. -Chest x-ray done on admission more consistent with ILD and volume overload. -Check a UA with cultures and sensitivities. -Check stool studies. -Monitor off antibiotics.  4.  Pulmonary hypertension -Verapamil on hold due to soft blood pressure on presentation. -Tyvaso ordered however pharmacy awaiting pulmonary input.  5.  Rheumatoid arthritis -Leflunomide. -Currently on IV Solu-Medrol.  6.  Hyperlipidemia -Statin.  7.  Depression/anxiety -Prozac.  8.  OSA -Noted to be on CPAP nightly however due to problem #1 and requiring BiPAP will place on BiPAP nightly.  9.  Chronic pain -Continue current home pain regiment.     DVT prophylaxis: Lovenox Code Status: DNR Family Communication: Updated patient.  No family at bedside. Disposition: TBD  Status is: Inpatient Remains inpatient appropriate because: Severity of illness   Consultants:  Pulmonary: Dr. Craige Cotta 01/21/2023  Procedures:  Chest x-ray 01/20/2023   Antimicrobials:  Anti-infectives (From admission, onward)    None         Subjective: Laying on gurney in the ED, currently off BiPAP.  Feels BiPAP overnight helped with her breathing.  Complains of diarrhea after recent antibiotics.  Asking for some Imodium.    Objective: Vitals:   01/21/23 1200 01/21/23 1245 01/21/23 1300 01/21/23 1600  BP: 127/72 131/82 133/80 105/81  Pulse: 92 (!) 104 (!)  102 97  Resp:  20  20  Temp:   97.6 F (36.4 C) 97.6 F (36.4 C)  TempSrc:    Oral  SpO2: 97% 97% 95% 94%  Weight:      Height:        Intake/Output Summary (Last 24 hours) at 01/21/2023 1737 Last data filed at 01/21/2023 1144 Gross per 24 hour  Intake 3 ml   Output 500 ml  Net -497 ml   Filed Weights   01/21/23 0654  Weight: 86.2 kg    Examination:  General exam: Appears calm and comfortable  Respiratory system: Diffuse crackles, decreased expiratory wheezing.  Occasional choppy sentences otherwise speaking in full sentences.   Cardiovascular system: S1 & S2 heard, RRR. No JVD, murmurs, rubs, gallops or clicks. No pedal edema. Gastrointestinal system: Abdomen is nondistended, soft and some diffuse tenderness to palpation.  Positive bowel sounds.  No rebound.  No guarding.   Central nervous system: Alert and oriented. No focal neurological deficits. Extremities: Symmetric 5 x 5 power. Skin: No rashes, lesions or ulcers Psychiatry: Judgement and insight appear normal. Mood & affect appropriate.     Data Reviewed: I have personally reviewed following labs and imaging studies  CBC: Recent Labs  Lab 01/20/23 2206 01/20/23 2211 01/21/23 0033  WBC 14.9*  --  12.4*  HGB 10.6* 11.9* 10.5*  HCT 34.7* 35.0* 34.8*  MCV 93.3  --  94.1  PLT 344  --  314    Basic Metabolic Panel: Recent Labs  Lab 01/20/23 2206 01/20/23 2211 01/21/23 0033  NA 131* 128* 133*  K 3.9 3.9 4.0  CL 93*  --  95*  CO2 23  --  22  GLUCOSE 85  --  57*  BUN 16  --  17  CREATININE 0.81  --  0.84  CALCIUM 10.2  --  9.9    GFR: Estimated Creatinine Clearance: 53.9 mL/min (by C-G formula based on SCr of 0.84 mg/dL).  Liver Function Tests: No results for input(s): "AST", "ALT", "ALKPHOS", "BILITOT", "PROT", "ALBUMIN" in the last 168 hours.  CBG: No results for input(s): "GLUCAP" in the last 168 hours.   Recent Results (from the past 240 hour(s))  SARS Coronavirus 2 by RT PCR (hospital order, performed in [facility] hospital lab) *cepheid single result test* Anterior Nasal Swab     Status: None   Collection Time: 01/20/23 10:06 PM   Specimen: Anterior Nasal Swab  Result Value Ref Range Status   SARS Coronavirus 2 by RT PCR NEGATIVE NEGATIVE Final     Comment: Performed at [facility] Lab, [address], [place], [place] [address]  C Difficile Quick Screen w PCR reflex     Status: None   Collection Time: 01/21/23 11:20 AM   Specimen: Stool  Result Value Ref Range Status   C Diff antigen NEGATIVE NEGATIVE Final   C Diff toxin NEGATIVE NEGATIVE Final   C Diff interpretation No C. difficile detected.  Final    Comment: Performed at [facility] Lab, [address] [place], [place] [address]         Radiology Studies: DG Chest Portable 1 View  Result Date: 01/20/2023 CLINICAL DATA:  Shortness of breath EXAM: PORTABLE CHEST 1 VIEW COMPARISON:  04/20/2022 FINDINGS: Cardiomegaly with vascular congestion. Perihilar and infrahilar opacities could reflect edema. Peripheral coarsened opacities compatible with fibrosis as seen on prior CT. No effusions. Aortic atherosclerosis. No acute bony abnormality. IMPRESSION: Cardiomegaly with vascular congestion and perihilar opacities  which could reflect pulmonary edema. Chronic changes/fibrosis. Electronically Signed   By: Charlett Nose M.D.   On: 01/20/2023 22:44        Scheduled Meds:  arformoterol  15 mcg Nebulization BID   bacitracin   Topical BID   budesonide (PULMICORT) nebulizer solution  0.5 mg Nebulization BID   enoxaparin (LOVENOX) injection  40 mg Subcutaneous Q24H   FLUoxetine  20 mg Oral Daily   furosemide  40 mg Intravenous Daily   leflunomide  20 mg Oral Daily   loratadine  10 mg Oral Daily   methylPREDNISolone (SOLU-MEDROL) injection  80 mg Intravenous Daily   montelukast  10 mg Oral QHS   oxyCODONE  10 mg Oral TID   pantoprazole  40 mg Oral Q0600   Pirfenidone  801 mg Oral TID   revefenacin  175 mcg Nebulization Daily   rosuvastatin  20 mg Oral Daily   sodium chloride flush  3 mL Intravenous Q12H   traZODone  100 mg Oral QHS   Continuous Infusions:   LOS: 0 days    Time spent: 40 minutes    Ramiro Harvest, MD [facility]   To contact the attending  provider between 7A-7P or the covering provider during after hours 7P-7A, please log into the web site [email or web address] and access using universal [place] password for that web site. If you do not have the password, please call the hospital operator.  01/21/2023, 5:37 PM

## 2023-01-22 ENCOUNTER — Inpatient Hospital Stay (HOSPITAL_COMMUNITY): Payer: Medicare Other

## 2023-01-22 DIAGNOSIS — J9621 Acute and chronic respiratory failure with hypoxia: Secondary | ICD-10-CM | POA: Diagnosis not present

## 2023-01-22 LAB — CBC WITH DIFFERENTIAL/PLATELET
Abs Immature Granulocytes: 0.09 10*3/uL — ABNORMAL HIGH (ref 0.00–0.07)
Basophils Absolute: 0 10*3/uL (ref 0.0–0.1)
Basophils Relative: 0 %
Eosinophils Absolute: 0 10*3/uL (ref 0.0–0.5)
Eosinophils Relative: 0 %
HCT: 32.7 % — ABNORMAL LOW (ref 36.0–46.0)
Hemoglobin: 10.6 g/dL — ABNORMAL LOW (ref 12.0–15.0)
Immature Granulocytes: 1 %
Lymphocytes Relative: 5 %
Lymphs Abs: 0.5 10*3/uL — ABNORMAL LOW (ref 0.7–4.0)
MCH: 29.1 pg (ref 26.0–34.0)
MCHC: 32.4 g/dL (ref 30.0–36.0)
MCV: 89.8 fL (ref 80.0–100.0)
Monocytes Absolute: 1.2 10*3/uL — ABNORMAL HIGH (ref 0.1–1.0)
Monocytes Relative: 13 %
Neutro Abs: 7.2 10*3/uL (ref 1.7–7.7)
Neutrophils Relative %: 81 %
Platelets: 303 10*3/uL (ref 150–400)
RBC: 3.64 MIL/uL — ABNORMAL LOW (ref 3.87–5.11)
RDW: 14 % (ref 11.5–15.5)
WBC: 9 10*3/uL (ref 4.0–10.5)
nRBC: 0.2 % (ref 0.0–0.2)

## 2023-01-22 LAB — GASTROINTESTINAL PANEL BY PCR, STOOL (REPLACES STOOL CULTURE)

## 2023-01-22 LAB — RESPIRATORY PANEL BY PCR

## 2023-01-22 LAB — POTASSIUM: Potassium: 3.8 mmol/L (ref 3.5–5.1)

## 2023-01-22 LAB — BASIC METABOLIC PANEL
Anion gap: 13 (ref 5–15)
BUN: 18 mg/dL (ref 8–23)
CO2: 31 mmol/L (ref 22–32)
Calcium: 9.4 mg/dL (ref 8.9–10.3)
Chloride: 88 mmol/L — ABNORMAL LOW (ref 98–111)
Creatinine, Ser: 0.85 mg/dL (ref 0.44–1.00)
GFR, Estimated: >60 mL/min (ref >60)
Glucose, Bld: 114 mg/dL — ABNORMAL HIGH (ref 70–99)
Potassium: 2.6 mmol/L — CL (ref 3.5–5.1)
Sodium: 132 mmol/L — ABNORMAL LOW (ref 135–145)

## 2023-01-22 LAB — C-REACTIVE PROTEIN: CRP: 8.2 mg/dL — ABNORMAL HIGH (ref <1.0)

## 2023-01-22 LAB — MRSA NEXT GEN BY PCR, NASAL: MRSA by PCR Next Gen: DETECTED — AB

## 2023-01-22 LAB — BRAIN NATRIURETIC PEPTIDE: B Natriuretic Peptide: 240.9 pg/mL — ABNORMAL HIGH (ref 0.0–100.0)

## 2023-01-22 LAB — PROCALCITONIN: Procalcitonin: 0.21 ng/mL

## 2023-01-22 LAB — MAGNESIUM: Magnesium: 1.8 mg/dL (ref 1.7–2.4)

## 2023-01-22 MED ORDER — MAGNESIUM SULFATE IN D5W 1-5 GM/100ML-% IV SOLN
1.0000 g | Freq: Once | INTRAVENOUS | Status: AC
  Administered 2023-01-22: 1 g via INTRAVENOUS
  Filled 2023-01-22: qty 100

## 2023-01-22 MED ORDER — POTASSIUM CHLORIDE 10 MEQ/100ML IV SOLN
10.0000 meq | INTRAVENOUS | Status: AC
  Administered 2023-01-22 (×5): 10 meq via INTRAVENOUS
  Filled 2023-01-22 (×5): qty 100

## 2023-01-22 MED ORDER — PREDNISONE 20 MG PO TABS
60.0000 mg | ORAL_TABLET | Freq: Every day | ORAL | Status: DC
  Administered 2023-01-23: 60 mg via ORAL
  Filled 2023-01-22: qty 3

## 2023-01-22 MED ORDER — POTASSIUM CHLORIDE CRYS ER 20 MEQ PO TBCR
40.0000 meq | EXTENDED_RELEASE_TABLET | Freq: Once | ORAL | Status: AC
  Administered 2023-01-22: 40 meq via ORAL
  Filled 2023-01-22: qty 2

## 2023-01-22 MED ORDER — LOPERAMIDE HCL 2 MG PO CAPS: 2.0000 mg | ORAL_CAPSULE | Freq: Four times a day (QID) | ORAL | Status: DC | PRN

## 2023-01-22 MED ORDER — SALINE SPRAY 0.65 % NA SOLN
1.0000 | NASAL | Status: DC | PRN
  Administered 2023-01-22: 1 via NASAL
  Filled 2023-01-22: qty 44

## 2023-01-22 MED ORDER — POTASSIUM CHLORIDE CRYS ER 20 MEQ PO TBCR: 20.0000 meq | EXTENDED_RELEASE_TABLET | Freq: Once | ORAL | Status: DC

## 2023-01-22 NOTE — Evaluation (Signed)
Physical Therapy Evaluation Patient Details Name: [name] MRN: [id number] DOB: [birth year] Today's Date: 01/22/2023  History of Present Illness  Patient is a 77 yo female presenting on 01/20/23 with SOB. PMH includes: Crohn's, HTN, HFpEF, asthmatic bronchitis, PAH, ILD, chronic respiratory failure on 7 L of oxygen, rheumatoid arthritis on chronic immunosuppression, and OSA  Clinical Impression  Pt admitted with above diagnosis. Pt from home with husband who reports having increasing difficulty caring for her due to her declining mobility and his back pain (just had a spinal cord stimulator placed). Pt had been walking short distances with rollator but then got to the point she could only transfer and for past week has been bed bound.  On eval pt needed mod A to come to sitting EOB. Pt stood within stedy with maximal use of UE's on bar and min A from therapist. Pt unable to maintain standing or step feet to ambulate. Patient will benefit from continued inpatient follow up therapy, <3 hours/day, however, also discussed if pt does not want to go to SNF that outside caregivers will need to be brought in to take physical work off husband. Pt and husband to discuss these options.  Pt currently with functional limitations due to the deficits listed below (see PT Problem List). Pt will benefit from acute skilled PT to increase their independence and safety with mobility to allow discharge.           Assistance Recommended at Discharge Frequent or constant Supervision/Assistance  If plan is discharge home, recommend the following:  Can travel by private vehicle  Two people to help with walking and/or transfers;A lot of help with bathing/dressing/bathroom;Assist for transportation;Help with stairs or ramp for entrance;Assistance with cooking/housework   No    Equipment Recommendations None recommended by PT  Recommendations for Other Services  OT consult    Functional Status Assessment Patient  has had a recent decline in their functional status and demonstrates the ability to make significant improvements in function in a reasonable and predictable amount of time.     Precautions / Restrictions Precautions Precautions: Fall Restrictions Weight Bearing Restrictions: No      Mobility  Bed Mobility Overal bed mobility: Needs Assistance Bed Mobility: Supine to Sit, Sit to Supine     Supine to sit: Mod assist Sit to supine: Max assist   General bed mobility comments: pt able to roll to L with mod A and grasp L rail with R hand. Mod A for LE's off EOB and elevation of trunk into sitting.    Transfers Overall transfer level: Needs assistance Equipment used: Ambulation equipment used Transfers: Sit to/from Stand Sit to Stand: Min assist, From elevated surface           General transfer comment: min A from elevated bed to stedy. 2x and then very fatigued.    Ambulation/Gait               General Gait Details: unable  Stairs            Wheelchair Mobility     Tilt Bed    Modified Rankin (Stroke Patients Only)       Balance Overall balance assessment: Needs assistance Sitting-balance support: Feet unsupported, Single extremity supported Sitting balance-Leahy Scale: Fair     Standing balance support: Bilateral upper extremity supported, During functional activity Standing balance-Leahy Scale: Poor Standing balance comment: heavy reliance on bar of stedy and min A. Could maintain for only 1 min at a time before  needing to sit. After 2 trials was very fatigued and had to return to supine                             Pertinent Vitals/Pain Pain Assessment Pain Assessment: Faces Faces Pain Scale: Hurts even more Pain Location: hands, LE's Pain Descriptors / Indicators: Sore Pain Intervention(s): Premedicated before session, Monitored during session, Limited activity within patient's tolerance    Home Living Family/patient expects  to be discharged to:: Private residence Living Arrangements: Spouse/significant other Available Help at Discharge: Family;Available 24 hours/day Type of Home: House Home Access: Stairs to enter Entrance Stairs-Rails: Can reach both Entrance Stairs-Number of Steps: 3   Home Layout: Two level;Full bath on main level;Able to live on main level with bedroom/bathroom Home Equipment: Rollator (4 wheels);Shower seat - built in;Cane - single point;Wheelchair - manual Additional Comments: husband has had numerous back surgeries and has difficulty assisting and lifting w/c out of car. They have a ramp that he's working on getting installed    Prior Function Prior Level of Function : Needs assist             Mobility Comments: walks with a rollator but for last several weeks has only been able to transfer ADLs Comments: husband assists with all lower body ADLs, is always present for showering     Hand Dominance   Dominant Hand: Right    Extremity/Trunk Assessment   Upper Extremity Assessment Upper Extremity Assessment: Defer to OT evaluation    Lower Extremity Assessment Lower Extremity Assessment: Generalized weakness;LLE deficits/detail LLE Deficits / Details: healing wound distal LE, has had numerous L ankle surgeries, minimal motion at L ankle LLE Sensation: decreased proprioception LLE Coordination: decreased gross motor    Cervical / Trunk Assessment Cervical / Trunk Assessment: Kyphotic  Communication   Communication: No difficulties  Cognition Arousal/Alertness: Awake/alert Behavior During Therapy: WFL for tasks assessed/performed Overall Cognitive Status: Within Functional Limits for tasks assessed                                          General Comments General comments (skin integrity, edema, etc.): discussed d/c options with pt and husband. With difficulties that her husband has had caring for her and her current functional level, would rec SNF.  However, pt and husband really don't want this so discussed them bringing in extra care to help with the physical caregiving. They seemed receptive and reported they could do this financially    Exercises     Assessment/Plan    PT Assessment Patient needs continued PT services  PT Problem List Decreased strength;Decreased range of motion;Decreased activity tolerance;Decreased balance;Decreased mobility;Decreased coordination;Pain;Decreased skin integrity;Decreased knowledge of use of DME       PT Treatment Interventions DME instruction;Gait training;Stair training;Functional mobility training;Therapeutic activities;Therapeutic exercise;Balance training;Neuromuscular re-education;Patient/family education    PT Goals (Current goals can be found in the Care Plan section)  Acute Rehab PT Goals Patient Stated Goal: return home PT Goal Formulation: With patient Time For Goal Achievement: 02/05/23 Potential to Achieve Goals: Fair    Frequency Min 3X/week     Co-evaluation               AM-PAC PT "6 Clicks" Mobility  Outcome Measure Help needed turning from your back to your side while in a flat bed without using bedrails?: A  Lot Help needed moving from lying on your back to sitting on the side of a flat bed without using bedrails?: Total Help needed moving to and from a bed to a chair (including a wheelchair)?: Total Help needed standing up from a chair using your arms (e.g., wheelchair or bedside chair)?: Total Help needed to walk in hospital room?: Total Help needed climbing 3-5 steps with a railing? : Total 6 Click Score: 7    End of Session Equipment Utilized During Treatment: Gait belt Activity Tolerance: Patient limited by fatigue Patient left: in bed;with call bell/phone within reach;with bed alarm set;with family/visitor present Nurse Communication: Mobility status PT Visit Diagnosis: Unsteadiness on feet (R26.81);History of falling (Z91.81);Muscle weakness  (generalized) (M62.81);Difficulty in walking, not elsewhere classified (R26.2);Pain Pain - Right/Left:  (bilateral) Pain - part of body: Hand    Time: 1308-6578 PT Time Calculation (min) (ACUTE ONLY): 40 min   Charges:   PT Evaluation $PT Eval Moderate Complexity: 1 Mod PT Treatments $Therapeutic Activity: 23-37 mins PT General Charges $$ ACUTE PT VISIT: 1 Visit         Lyanne Co, PT  Acute Rehab Services Secure chat preferred Office [phone]   Lawana Chambers Chandan Fly 01/22/2023, 5:54 PM

## 2023-01-22 NOTE — Plan of Care (Signed)

## 2023-01-22 NOTE — Progress Notes (Signed)
   01/22/23 0052  BiPAP/CPAP/SIPAP  $ Non-Invasive Ventilator  Non-Invasive Vent Set Up  $ Face Mask Medium Yes  BiPAP/CPAP/SIPAP Pt Type Adult  BiPAP/CPAP/SIPAP Resmed  Mask Type Full face mask  Mask Size Medium  Respiratory Rate 26 breaths/min  EPAP 8 cmH2O (CPAP pressure of 8cmh2o. set per patient comfort)  Flow Rate 6 lpm  Auto Titrate No (unable to tolerate auto titrate. states not enough pressure and not enough air.)  BiPAP/CPAP /SiPAP Vitals  Pulse Rate (!) 102  Resp (!) 26  BP 128/81  SpO2 92 %  Bilateral Breath Sounds Diminished  MEWS Score/Color  MEWS Score 3  MEWS Score Color Yellow

## 2023-01-22 NOTE — Progress Notes (Signed)
   NAME:  [name], MRN:  [id number], DOB:  1946-04-04, LOS: 1 ADMISSION DATE:  01/20/2023, CONSULTATION DATE:  01/21/2023 REFERRING MD:  Dr. Janee Morn, [facility], CHIEF COMPLAINT:  Short of breath   History of Present Illness:  77 yo female with hx of RA, Crohn's disease, ILD, Pulmonary hypertension, OSA and chronic respiratory failure is followed in pulmonary clinic by [name].  She presented to [facility] ER on 7/05 with increased shortness of breath for one week.  She had her prednisone increased to 20 mg daily with initial improvement, but then her breathing got worse.  She has cough with green sputum and feels more fatigued.  CXR showed vascular congestion and she was treated with lasix.  Pertinent  Medical History  Asthma, Allergies, Anxiety, Arthritis, GERD, HTN, IBS, Rheumatoid arthritis, Sleep apnea, Crohn's disease, ILD, OSA, Chronic respiratory failure  Significant Hospital Events:   7/05 Admit 7/7 Unable to tolerate CPAP overnight   Studies:  HRCT chest 05/16/21 >> widespread patchy GGO, septal thickening, subpleural reticulation, regional BTX, air trapping, tracheomalacia RHC 12/17/21 >> RA 7, RV 62/12, PA 63.17 (36), PCW 11, FI 3.8, PRV 3.5 WU PFT 06/07/22 >> FEV1 1.13 (65%), FVC 1.70 (63%), DLCO 36% Echo 11/23/22 >> EF 55 to 60%, grade 1 DD, mod elevation in PASP  Interim History / Subjective:  She continues to report dyspnea and inability to tolerate CPAP overnight. She states she is very frustrated with ongoing chronic illness to the point she is depressed. Husband at bedside   Objective   Blood pressure (!) 150/68, pulse 73, temperature 97.8 F (36.6 C), resp. rate 16, height 5' (1.524 m), weight 74.4 kg, SpO2 91 %.    Pressure Support:  [5 cmH20] 5 cmH20   Intake/Output Summary (Last 24 hours) at 01/22/2023 1249 Last data filed at 01/22/2023 1111 Gross per 24 hour  Intake 81.58 ml  Output 450 ml  Net -368.42 ml    Filed Weights   01/21/23 0654 01/22/23 0507  Weight:  86.2 kg 74.4 kg    Examination: General: Acute on chronically ill appearing elderly female lying in bed, in NAD HEENT: [place]/AT, MM pink/moist, PERRL,  Neuro: Alert and oriented x3, non-focal, global weakness  CV: s1s2 regular rate and rhythm, no murmur, rubs, or gallops,  PULM:  Diminished breath sounds bilaterally, on 7L [place], dyspnea with speaking  GI: soft, bowel sounds active in all 4 quadrants, non-tender, non-distended, tolerating oral diet Extremities: warm/dry, no edema  Skin: no rashes or lesions   Resolved Hospital Problem list     Assessment & Plan:   Acute on chronic respiratory failure Interstitial lung disease Acute on chronic 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 Asthma Goals of care  Plan Continue supplemental oxygen with SpO2 goal > 885 Continue Pirfenidone and Lefluomide  Hold home [place] as patient unable to tolerate  Repeat ECHO pending  CPAP at HS as able  Continue BDs  Wean steroids as able will discuss with attending  Given significant deconditioning with underlying ILD will consult palliative care to assist in developing GOC   Signature:  Tyrann Donaho D. Harris, NP-C [facility] Personal contact information can be found on Amion  If no contact or response made please call [phone] 01/22/2023, 1:05 PM

## 2023-01-22 NOTE — Progress Notes (Signed)
PROGRESS NOTE                                                                                                                                                                                                             Patient Demographics:    [name], is a 77 y.o. female, DOB - [birth year], ZOX:[id number]  Outpatient Primary MD for the patient is Hailey Apley, MD    LOS - 1  Admit date - 01/20/2023    Chief Complaint  Patient presents with   Shortness of Breath            Brief Narrative (HPI from H&P)   77 year old female history of pulmonary fibrosis, PAH, HFpEF, RA on chronic prednisone 5 mg daily, chronic hypoxic respiratory failure on 7 L O2 via nasal cannula at baseline, OSA on CPAP presented to the ED with acute on chronic respiratory secondary to ILD and CHF exacerbation. Pulmonary consulted.    Subjective:    [name] today has, No headache, No chest pain, No abdominal pain - No Nausea, No new weakness tingling or numbness, improved diarrhea and improving shortness of breath   Assessment  & Plan :    #1 Acute on chronic hypoxic respiratory failure secondary to ILD/pulmonary fibrosis and HFpEF exacerbation -Acute presentation multifactorial secondary to ILD and due to on chronic diastolic CHF EF 60% on echocardiogram done few months ago in May 2024, pulmonary critical care following the patient, she has advanced ILD at baseline and uses 7 L of oxygen at home, currently being diuresed with Lasix with good effect, continue supplemental oxygen, on IV steroids along with nebulizer treatments.  No signs of active infection, supplemental CPAP/BiPAP or NRB as needed.  She is DNR.  With IV steroids and diuretics some improvement on 01/22/2023.  2.  Diarrhea -Norovirus infection present on admission, as needed Imodium   3.  Leukocytosis -Likely secondary to recent steroid use. -Afebrile continue to monitor    4.  Pulmonary hypertension -Verapamil on hold due to soft blood pressure on presentation. -Tyvaso ordered however pharmacy awaiting pulmonary input.   5.  Rheumatoid arthritis -Leflunomide. -Currently on IV Solu-Medrol.  Chronically on 5 mg of prednisone.   6.  Hyperlipidemia -Statin.   7.  Depression/anxiety -Prozac.   8.  OSA -Noted to be on CPAP nightly however due to  problem #1 and requiring BiPAP will place on BiPAP nightly.   9.  Chronic pain -Continue current home pain regiment.        Condition - Extremely Guarded  Family Communication  :  None  Code Status : DNR  Consults  :  [name]  PUD Prophylaxis :  PPI   Procedures  :            Disposition Plan  :    Status is: Inpatient  DVT Prophylaxis  :    enoxaparin (LOVENOX) injection 40 mg Start: 01/21/23 2200    Lab Results  Component Value Date   PLT 303 01/22/2023    Diet :  Diet Order             Diet Heart Room service appropriate? Yes; Fluid consistency: Thin  Diet effective now                    Inpatient Medications  Scheduled Meds:  arformoterol  15 mcg Nebulization BID   bacitracin   Topical BID   budesonide (PULMICORT) nebulizer solution  0.5 mg Nebulization BID   enoxaparin (LOVENOX) injection  40 mg Subcutaneous Q24H   FLUoxetine  20 mg Oral Daily   furosemide  40 mg Intravenous Daily   leflunomide  20 mg Oral Daily   loratadine  10 mg Oral Daily   methylPREDNISolone (SOLU-MEDROL) injection  80 mg Intravenous Daily   montelukast  10 mg Oral QHS   oxyCODONE  10 mg Oral TID   pantoprazole  40 mg Oral Q0600   Pirfenidone  801 mg Oral TID   potassium chloride  40 mEq Oral Once   revefenacin  175 mcg Nebulization Daily   rosuvastatin  20 mg Oral Daily   sodium chloride flush  3 mL Intravenous Q12H   traZODone  100 mg Oral QHS   verapamil  120 mg Oral Daily   Continuous Infusions:  magnesium sulfate bolus IVPB     potassium chloride 10 mEq (01/22/23 0741)   PRN  Meds:.acetaminophen **OR** acetaminophen, albuterol, ondansetron **OR** ondansetron (ZOFRAN) IV, senna-docusate  Antibiotics  :    Anti-infectives (From admission, onward)    None         Objective:   Vitals:   01/22/23 0121 01/22/23 0149 01/22/23 0357 01/22/23 0507  BP: 118/60  101/65   Pulse: 87 93 75 74  Resp: 19 20 (!) 23 17  Temp:   98.9 F (37.2 C)   TempSrc:   Axillary   SpO2: 92% (!) 83% 90% 90%  Weight:    74.4 kg  Height:        Wt Readings from Last 3 Encounters:  01/22/23 74.4 kg  12/16/22 79.4 kg  11/01/22 84.1 kg     Intake/Output Summary (Last 24 hours) at 01/22/2023 1002 Last data filed at 01/22/2023 0600 Gross per 24 hour  Intake 3 ml  Output 450 ml  Net -447 ml     Physical Exam  Awake Alert, No new F.N deficits, Normal affect [name].AT,PERRAL Supple Neck, No JVD,   Symmetrical Chest wall movement, Good air movement bilaterally, CTAB RRR,No Gallops,Rubs or new Murmurs,  +ve B.Sounds, Abd Soft, No tenderness,   No Cyanosis, Clubbing or edema       Data Review:    Recent Labs  Lab 01/20/23 2206 01/20/23 2211 01/21/23 0033 01/22/23 0513  WBC 14.9*  --  12.4* 9.0  HGB 10.6* 11.9* 10.5* 10.6*  HCT 34.7* 35.0* 34.8*  32.7*  PLT 344  --  314 303  MCV 93.3  --  94.1 89.8  MCH 28.5  --  28.4 29.1  MCHC 30.5  --  30.2 32.4  RDW 14.2  --  14.2 14.0  LYMPHSABS  --   --   --  0.5*  MONOABS  --   --   --  1.2*  EOSABS  --   --   --  0.0  BASOSABS  --   --   --  0.0    Recent Labs  Lab 01/20/23 2206 01/20/23 2211 01/21/23 0033 01/22/23 0513 01/22/23 0616  NA 131* 128* 133* 132*  --   K 3.9 3.9 4.0 2.6*  --   CL 93*  --  95* 88*  --   CO2 23  --  22 31  --   ANIONGAP 15  --  16* 13  --   GLUCOSE 85  --  57* 114*  --   BUN 16  --  17 18  --   CREATININE 0.81  --  0.84 0.85  --   CRP  --   --   --   --  8.2*  PROCALCITON  --   --   --   --  0.21  BNP 495.9*  --   --   --  240.9*  MG  --   --   --   --  1.8  CALCIUM 10.2  --  9.9  9.4  --       Recent Labs  Lab 01/20/23 2206 01/21/23 0033 01/22/23 0513 01/22/23 0616  CRP  --   --   --  8.2*  PROCALCITON  --   --   --  0.21  BNP 495.9*  --   --  240.9*  MG  --   --   --  1.8  CALCIUM 10.2 9.9 9.4  --    Micro Results Recent Results (from the past 240 hour(s))  SARS Coronavirus 2 by RT PCR (hospital order, performed in [facility] hospital lab) *cepheid single result test* Anterior Nasal Swab     Status: None   Collection Time: 01/20/23 10:06 PM   Specimen: Anterior Nasal Swab  Result Value Ref Range Status   SARS Coronavirus 2 by RT PCR NEGATIVE NEGATIVE Final    Comment: Performed at [facility], [address] [place], [place] [address]  C Difficile Quick Screen w PCR reflex     Status: None   Collection Time: 01/21/23 11:20 AM   Specimen: Stool  Result Value Ref Range Status   C Diff antigen NEGATIVE NEGATIVE Final   C Diff toxin NEGATIVE NEGATIVE Final   C Diff interpretation No C. difficile detected.  Final    Comment: Performed at [facility], [address] [place], [place] [address]  Gastrointestinal Panel by PCR , Stool     Status: Abnormal   Collection Time: 01/21/23  1:36 PM   Specimen: Stool  Result Value Ref Range Status   Campylobacter species NOT DETECTED NOT DETECTED Final   Plesimonas shigelloides NOT DETECTED NOT DETECTED Final   Salmonella species NOT DETECTED NOT DETECTED Final   Yersinia enterocolitica NOT DETECTED NOT DETECTED Final   Vibrio species NOT DETECTED NOT DETECTED Final   Vibrio cholerae NOT DETECTED NOT DETECTED Final   Enteroaggregative E coli (EAEC) NOT DETECTED NOT DETECTED Final   Enteropathogenic E coli (EPEC) NOT DETECTED NOT DETECTED Final  Enterotoxigenic E coli (ETEC) NOT DETECTED NOT DETECTED Final   Shiga like toxin producing E coli (STEC) NOT DETECTED NOT DETECTED Final   Shigella/Enteroinvasive E coli (EIEC) NOT DETECTED NOT DETECTED Final   Cryptosporidium NOT DETECTED NOT DETECTED  Final   Cyclospora cayetanensis NOT DETECTED NOT DETECTED Final   Entamoeba histolytica NOT DETECTED NOT DETECTED Final   Giardia lamblia NOT DETECTED NOT DETECTED Final   Adenovirus F40/41 NOT DETECTED NOT DETECTED Final   Astrovirus NOT DETECTED NOT DETECTED Final   Norovirus GI/GII DETECTED (A) NOT DETECTED Final    Comment: CRITICAL RESULT CALLED TO, READ BACK BY AND VERIFIED WITH: MILLS,AMY RN @ 01/22/23 @ 0203 LFD    Rotavirus A NOT DETECTED NOT DETECTED Final   Sapovirus (I, II, IV, and V) NOT DETECTED NOT DETECTED Final    Comment: Performed at [facility], [address] [place], [place] [address]    Radiology Reports DG Chest [place] 1 View  Result Date: 01/22/2023 CLINICAL DATA:  Short of breath EXAM: PORTABLE CHEST 1 VIEW COMPARISON:  None Available. FINDINGS: Normal cardiac silhouette. LEFT basilar atelectasis similar prior. Chronic bronchitic markings. No effusion, pulmonary edema or infiltrate. No acute osseous abnormality. IMPRESSION: 1. LEFT basilar atelectasis. 2. Chronic bronchitic markings. Electronically Signed   By: Genevive Bi M.D.   On: 01/22/2023 09:10   DG Abd 1 View  Result Date: 01/21/2023 CLINICAL DATA:  Abdominal pain. EXAM: ABDOMEN - 1 VIEW COMPARISON:  None Available. FINDINGS: No bowel dilatation to suggest obstruction. Small volume of formed stool in the colon. Surgical clips in the right upper quadrant, typical of cholecystectomy. Lumbosacral fusion hardware. No definite radiopaque calculi. IMPRESSION: Normal bowel gas pattern.  No radiographic explanation for pain. Electronically Signed   By: Narda Rutherford M.D.   On: 01/21/2023 21:26   DG Chest Portable 1 View  Result Date: 01/20/2023 CLINICAL DATA:  Shortness of breath EXAM: PORTABLE CHEST 1 VIEW COMPARISON:  04/20/2022 FINDINGS: Cardiomegaly with vascular congestion. Perihilar and infrahilar opacities could reflect edema. Peripheral coarsened opacities compatible with fibrosis as seen on  prior CT. No effusions. Aortic atherosclerosis. No acute bony abnormality. IMPRESSION: Cardiomegaly with vascular congestion and perihilar opacities which could reflect pulmonary edema. Chronic changes/fibrosis. Electronically Signed   By: Charlett Nose M.D.   On: 01/20/2023 22:44      Signature  -   Susa Raring M.D on 01/22/2023 at 10:02 AM   -  To page go to [email or web address]

## 2023-01-22 NOTE — Progress Notes (Addendum)
   01/22/23 0149  BiPAP/CPAP/SIPAP  BiPAP/CPAP/SIPAP Pt Type Adult  BiPAP/CPAP/SIPAP Resmed  Mask Type Full face mask  Mask Size Medium  IPAP 12 cmH20 (max)  EPAP 6 cmH2O (min)  Pressure Support 5 cmH20  Auto Titrate (S)  Yes  BiPAP/CPAP /SiPAP Vitals  Pulse Rate 93  Resp 20  SpO2 (!) 83 %  MEWS Score/Color  MEWS Score 0  MEWS Score Color Green   Settings adjusted again per patient request. Mask excessively leaks but patient states "mask is too tight". Patient continuously manipulate the mask on her face. After numerous attempts of readjusting mask and positive reinforcement given that the mask needs to be a "secure fit" in order to get effective therapy from the NIV mask/machine patient still states mask is tight. Patient is tolerating it poorly but wants to remain wearing the mask. Will assess and monitor as needed/upon request. As on now, patient seems to tolerate the mask. At bedside for 15 minutes to see if patient can get acclimated to it. No distress noted

## 2023-01-22 NOTE — Plan of Care (Signed)

## 2023-01-22 NOTE — Progress Notes (Signed)
   01/22/23 0101  BiPAP/CPAP/SIPAP  BiPAP/CPAP/SIPAP Pt Type Adult  BiPAP/CPAP/SIPAP Resmed  Mask Type Full face mask  Mask Size Medium  Respiratory Rate 22 breaths/min  IPAP 14 cmH20  EPAP 8 cmH2O  Flow Rate 6 lpm  Patient Home Equipment No  Auto Titrate No  BiPAP/CPAP /SiPAP Vitals  Pulse Rate 86  Resp (!) 24  SpO2 93 %  MEWS Score/Color  MEWS Score 1  MEWS Score Color Green   Settings adjusted per patient request and intolerance from previous documented settings

## 2023-01-23 ENCOUNTER — Inpatient Hospital Stay (HOSPITAL_COMMUNITY): Payer: Medicare Other

## 2023-01-23 DIAGNOSIS — J849 Interstitial pulmonary disease, unspecified: Secondary | ICD-10-CM | POA: Diagnosis not present

## 2023-01-23 DIAGNOSIS — K5090 Crohn's disease, unspecified, without complications: Secondary | ICD-10-CM

## 2023-01-23 DIAGNOSIS — J9621 Acute and chronic respiratory failure with hypoxia: Secondary | ICD-10-CM | POA: Diagnosis not present

## 2023-01-23 DIAGNOSIS — A0811 Acute gastroenteropathy due to Norwalk agent: Secondary | ICD-10-CM

## 2023-01-23 DIAGNOSIS — I272 Pulmonary hypertension, unspecified: Secondary | ICD-10-CM

## 2023-01-23 DIAGNOSIS — Z7189 Other specified counseling: Secondary | ICD-10-CM

## 2023-01-23 LAB — CBC WITH DIFFERENTIAL/PLATELET
Abs Immature Granulocytes: 0.06 10*3/uL (ref 0.00–0.07)
Basophils Absolute: 0 10*3/uL (ref 0.0–0.1)
Basophils Relative: 0 %
Eosinophils Absolute: 0 10*3/uL (ref 0.0–0.5)
Eosinophils Relative: 0 %
HCT: 35.6 % — ABNORMAL LOW (ref 36.0–46.0)
Hemoglobin: 11.4 g/dL — ABNORMAL LOW (ref 12.0–15.0)
Immature Granulocytes: 1 %
Lymphocytes Relative: 5 %
Lymphs Abs: 0.3 10*3/uL — ABNORMAL LOW (ref 0.7–4.0)
MCH: 29.5 pg (ref 26.0–34.0)
MCHC: 32 g/dL (ref 30.0–36.0)
MCV: 92 fL (ref 80.0–100.0)
Monocytes Absolute: 0.7 10*3/uL (ref 0.1–1.0)
Monocytes Relative: 11 %
Neutro Abs: 5.2 10*3/uL (ref 1.7–7.7)
Neutrophils Relative %: 83 %
Platelets: 333 10*3/uL (ref 150–400)
RBC: 3.87 MIL/uL (ref 3.87–5.11)
RDW: 14.1 % (ref 11.5–15.5)
WBC: 6.2 10*3/uL (ref 4.0–10.5)
nRBC: 0 % (ref 0.0–0.2)

## 2023-01-23 LAB — BASIC METABOLIC PANEL
Anion gap: 8 (ref 5–15)
BUN: 20 mg/dL (ref 8–23)
CO2: 33 mmol/L — ABNORMAL HIGH (ref 22–32)
Calcium: 9.7 mg/dL (ref 8.9–10.3)
Chloride: 92 mmol/L — ABNORMAL LOW (ref 98–111)
Creatinine, Ser: 0.66 mg/dL (ref 0.44–1.00)
GFR, Estimated: >60 mL/min (ref >60)
Glucose, Bld: 136 mg/dL — ABNORMAL HIGH (ref 70–99)
Potassium: 4 mmol/L (ref 3.5–5.1)
Sodium: 133 mmol/L — ABNORMAL LOW (ref 135–145)

## 2023-01-23 LAB — URINE CULTURE: Culture: 20000 — AB

## 2023-01-23 LAB — ECHOCARDIOGRAM COMPLETE
Height: 60 in
S' Lateral: 3.6 cm
Weight: 2656.1 oz

## 2023-01-23 LAB — MAGNESIUM: Magnesium: 2.2 mg/dL (ref 1.7–2.4)

## 2023-01-23 LAB — BRAIN NATRIURETIC PEPTIDE: B Natriuretic Peptide: 189 pg/mL — ABNORMAL HIGH (ref 0.0–100.0)

## 2023-01-23 LAB — C-REACTIVE PROTEIN: CRP: 3.9 mg/dL — ABNORMAL HIGH (ref <1.0)

## 2023-01-23 MED ORDER — LEFLUNOMIDE 10 MG PO TABS
20.0000 mg | ORAL_TABLET | Freq: Every day | ORAL | Status: DC
  Administered 2023-01-24: 20 mg via ORAL
  Filled 2023-01-23 (×2): qty 2

## 2023-01-23 MED ORDER — PREDNISONE 5 MG PO TABS: 10.0000 mg | ORAL_TABLET | Freq: Every day | ORAL | Status: DC

## 2023-01-23 MED ORDER — PREDNISONE 20 MG PO TABS
60.0000 mg | ORAL_TABLET | Freq: Every day | ORAL | Status: DC
  Administered 2023-01-24 – 2023-01-25 (×2): 60 mg via ORAL
  Filled 2023-01-23 (×2): qty 3

## 2023-01-23 MED ORDER — PREDNISONE 20 MG PO TABS: 40.0000 mg | ORAL_TABLET | Freq: Every day | ORAL | Status: DC

## 2023-01-23 MED ORDER — PREDNISONE 20 MG PO TABS: 20.0000 mg | ORAL_TABLET | Freq: Every day | ORAL | Status: DC

## 2023-01-23 MED ORDER — PREDNISONE 5 MG PO TABS: 5.0000 mg | ORAL_TABLET | Freq: Every day | ORAL | Status: DC

## 2023-01-23 NOTE — [place] FL2 (Signed)
[place] MEDICAID FL2 LEVEL OF CARE FORM     IDENTIFICATION  Patient Name: [name] Birthdate: 1946/04/23 Sex: female Admission Date (Current Location): 01/20/2023  [facility] and [place] Number:  Producer, television/film/video and Address:  [facility] [place]. [facility], [address] [place], [place] [address]      Provider Number: [id number]  Attending Physician Name and Address:  Leroy Sea, MD  Relative Name and Phone Number:       Current Level of Care: Hospital Recommended Level of Care: Skilled Nursing Facility Prior Approval Number:    Date Approved/Denied:   PASRR Number: [id number]  Discharge Plan: SNF    Current Diagnoses: Patient Active Problem List   Diagnosis Date Noted   Acute on chronic hypoxic respiratory failure (HCC) 01/21/2023   Diarrhea 01/21/2023   Wound of left leg 05/17/2022   Medication monitoring encounter 05/17/2022   Acute on chronic diastolic CHF (congestive heart failure) (HCC) 04/22/2022   Hypokalemia 04/22/2022   Petechial rash 04/21/2022   Leukocytosis 04/21/2022   Cellulitis of left lower extremity 04/21/2022   Elevated troponin 04/21/2022   Acute on chronic heart failure with preserved ejection fraction (HFpEF) (HCC) 04/20/2022   Mild persistent asthma without complication 10/02/2019   Therapeutic drug monitoring 10/02/2019   Chronic respiratory failure with hypoxia (HCC) 03/07/2019   Physical deconditioning 02/11/2019   Obese 12/21/2018   S/P right THA, AA 12/20/2018   Pulmonary fibrosis (HCC) 10/12/2018   Chronic heart failure with preserved ejection fraction (HFpEF) (HCC) 10/12/2018   Acute on chronic respiratory failure with hypoxia (HCC) 08/29/2018   Bronchiectasis without complication (HCC) 08/29/2018   Wound infection after surgery 05/09/2018   Hyperlipidemia LDL goal <70 01/09/2017   Ankle arthritis 12/19/2016   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/2016   Hallux valgus of left foot 12/19/2016   Painful  orthopaedic hardware (HCC) 12/19/2016   Pes planus 12/19/2016   Subluxation of interphalangeal joint of lesser toe of left foot 12/19/2016   Pulmonary hypertension (HCC) 10/25/2016   Coronary artery disease involving native coronary artery of native heart without angina pectoris 10/25/2016   Shortness of breath 09/12/2016   Leg edema 09/12/2016   Obstructive sleep apnea 09/12/2016   ILD (interstitial lung disease) (HCC) 09/19/2015   Thrush, oral 06/12/2014   Influenza A with pneumonia 08/02/2013   CAP (community acquired pneumonia) 07/31/2013   Hypoxia 07/31/2013   Non-healing wound of left lower extremity 11/15/2012   COPD with chronic bronchitis and emphysema (HCC) 07/21/2009   Asthmatic bronchitis, moderate persistent, uncomplicated 04/20/2009   RHINOSINUSITIS, CHRONIC 12/19/2008   ANXIETY DEPRESSION 03/25/2008   ASTHMATIC BRONCHITIS, ACUTE 05/12/2007   Seasonal and perennial allergic rhinitis 05/12/2007   REFLUX, ESOPHAGEAL 05/12/2007   Rheumatoid arthritis (HCC) 05/12/2007    Orientation RESPIRATION BLADDER Height & Weight     Self, Time, Situation, Place  Normal (Has home cpap) Incontinent Weight: 166 lb 0.1 oz (75.3 kg) Height:  5' (152.4 cm)  BEHAVIORAL SYMPTOMS/MOOD NEUROLOGICAL BOWEL NUTRITION STATUS      Continent Diet (See dc summary)  AMBULATORY STATUS COMMUNICATION OF NEEDS Skin   Extensive Assist Verbally Other (Comment) (Wound on pretibial; sacrum)                       Personal Care Assistance Level of Assistance  Bathing, Feeding, Dressing Bathing Assistance: Maximum assistance Feeding assistance: Limited assistance Dressing Assistance: Maximum assistance     Functional Limitations Info  SPECIAL CARE FACTORS FREQUENCY  PT (By licensed PT), OT (By licensed OT)     PT Frequency: 5x/week OT Frequency: 5x/week            Contractures Contractures Info: Not present    Additional Factors Info  Code Status, Allergies, Isolation  Precautions Code Status Info: DNR Allergies Info: Cefdinir, Erythromycin Base, Lactose, Lactulose, Mesalamine, Methotrexate, Nitrofuran Derivatives, Other, Sulfa Antibiotics, Tdap (Tetanus-diphth-acell Pertussis), Tetanus Toxoid, Adsorbed     Isolation Precautions Info: Norovirus     Current Medications (01/23/2023):  This is the current hospital active medication list Current Facility-Administered Medications  Medication Dose Route Frequency Provider Last Rate Last Admin   acetaminophen (TYLENOL) tablet 650 mg  650 mg Oral Q6H PRN Charlsie Quest, MD       Or   acetaminophen (TYLENOL) suppository 650 mg  650 mg Rectal Q6H PRN Darreld Mclean R, MD       albuterol (PROVENTIL) (2.5 MG/3ML) 0.083% nebulizer solution 2.5 mg  2.5 mg Nebulization Q4H PRN Coralyn Helling, MD       arformoterol (BROVANA) nebulizer solution 15 mcg  15 mcg Nebulization BID Charlsie Quest, MD   15 mcg at 01/23/23 0754   bacitracin ointment   Topical BID Rodolph Bong, MD   31.5 Application at 01/23/23 0905   budesonide (PULMICORT) nebulizer solution 0.5 mg  0.5 mg Nebulization BID Coralyn Helling, MD   0.5 mg at 01/23/23 0754   enoxaparin (LOVENOX) injection 40 mg  40 mg Subcutaneous Q24H Darreld Mclean R, MD   40 mg at 01/22/23 2140   FLUoxetine (PROZAC) capsule 20 mg  20 mg Oral Daily Darreld Mclean R, MD   20 mg at 01/23/23 0904   furosemide (LASIX) injection 40 mg  40 mg Intravenous Daily Rodolph Bong, MD   40 mg at 01/23/23 0904   [START ON 01/24/2023] leflunomide (ARAVA) tablet 20 mg  20 mg Oral Daily Pham, Minh Q, RPH-CPP       loperamide (IMODIUM) capsule 2 mg  2 mg Oral Q6H PRN Leroy Sea, MD       loratadine (CLARITIN) tablet 10 mg  10 mg Oral Daily Rodolph Bong, MD   10 mg at 01/23/23 0902   montelukast (SINGULAIR) tablet 10 mg  10 mg Oral QHS Darreld Mclean R, MD   10 mg at 01/22/23 2140   ondansetron (ZOFRAN) tablet 4 mg  4 mg Oral Q6H PRN Charlsie Quest, MD       Or   ondansetron (ZOFRAN)  injection 4 mg  4 mg Intravenous Q6H PRN Darreld Mclean R, MD   4 mg at 01/21/23 2253   oxyCODONE (Oxy IR/ROXICODONE) immediate release tablet 10 mg  10 mg Oral TID Charlsie Quest, MD   10 mg at 01/23/23 0904   pantoprazole (PROTONIX) EC tablet 40 mg  40 mg Oral Q0600 Rodolph Bong, MD   40 mg at 01/23/23 0506   Pirfenidone TABS 801 mg  801 mg Oral TID Charlsie Quest, MD   801 mg at 01/23/23 0953   predniSONE (DELTASONE) tablet 60 mg  60 mg Oral Q breakfast Janeann Forehand D, NP   60 mg at 01/23/23 0902   revefenacin (YUPELRI) nebulizer solution 175 mcg  175 mcg Nebulization Daily Coralyn Helling, MD   175 mcg at 01/23/23 0754   rosuvastatin (CRESTOR) tablet 20 mg  20 mg Oral Daily Charlsie Quest, MD   20 mg at 01/23/23 [phone]  senna-docusate (Senokot-S) tablet 1 tablet  1 tablet Oral QHS PRN Charlsie Quest, MD   1 tablet at 01/21/23 1144   sodium chloride (OCEAN) 0.65 % nasal spray 1 spray  1 spray Each Nare PRN Hillary Bow, DO   1 spray at 01/22/23 2140   sodium chloride flush (NS) 0.9 % injection 3 mL  3 mL Intravenous Q12H Darreld Mclean R, MD   3 mL at 01/23/23 0906   traZODone (DESYREL) tablet 100 mg  100 mg Oral QHS Coralyn Helling, MD   100 mg at 01/22/23 2140   verapamil (CALAN-SR) CR tablet 120 mg  120 mg Oral Daily Rodolph Bong, MD   120 mg at 01/23/23 5621     Discharge Medications: Please see discharge summary for a list of discharge medications.  Relevant Imaging Results:  Relevant Lab Results:   Additional Information SSN: [id number] [place], [place]

## 2023-01-23 NOTE — Evaluation (Signed)
Occupational Therapy Evaluation Patient Details Name: [name] MRN: [id number] DOB: 1946-06-29 Today's Date: 01/23/2023   History of Present Illness Patient is a 77 yo female presenting on 01/20/23 with SOB. PMH includes: Crohn's, HTN, HFpEF, asthmatic bronchitis, PAH, ILD, chronic respiratory failure on 7 L of oxygen, rheumatoid arthritis on chronic immunosuppression, and OSA   Clinical Impression   Pt admitted with above and presents to OT with impairments impacting ability to engage in ADLs and functional mobility.    Pt from home with husband who reports having increasing difficulty caring for her due to her declining mobility and his back pain (just had a spinal cord stimulator placed). Pt had been walking short distances with rollator but then got to the point she could only transfer and for past week has been bed bound.  On eval pt needed min-mod A for rolling at bed level with assist of bed rails to roll fully to R/L side.  Did not attempt OOB mobility or sitting to EOB due to fatigue and decreased O2 sats with rolling.  OT providing cues for breathing technique with rolling/mobility. Patient will benefit from continued inpatient follow up therapy, <3 hours/day, however, also discussed if pt does not want to go to SNF that outside caregivers will need to be brought in to take physical work off husband.  Husband not present during OT eval, but pt expressing interest to learn all her options as she recognizes her BLE instability/weakness and that her care is a lot on her husband at this time. Pt currently with functional limitations due to the deficits listed below (see OT Problem List). Pt will benefit from acute skilled OT to increase their independence and safety with ADLs and functional mobility to allow discharge.      Recommendations for follow up therapy are one component of a multi-disciplinary discharge planning process, led by the attending physician.  Recommendations may be updated  based on patient status, additional functional criteria and insurance authorization.   Assistance Recommended at Discharge Frequent or constant Supervision/Assistance  Patient can return home with the following A lot of help with bathing/dressing/bathroom;Assist for transportation;Two people to help with walking and/or transfers    Functional Status Assessment  Patient has had a recent decline in their functional status and/or demonstrates limited ability to make significant improvements in function in a reasonable and predictable amount of time  Equipment Recommendations  Hospital bed       Precautions / Restrictions Precautions Precautions: Fall Restrictions Weight Bearing Restrictions: No      Mobility Bed Mobility Overal bed mobility: Needs Assistance Bed Mobility: Rolling Rolling: Min assist, Mod assist         General bed mobility comments: Pt able to roll to R with min A and L with mod A with use of bed rails. Pt able to reposition BLE with increased time to assist in rolling and boosting up in bed.    Transfers Overall transfer level: Needs assistance                 General transfer comment: did not attempt OOB this session due to fatigue and O2 sats in low 80s just with rolling for perihygiene          ADL either performed or assessed with clinical judgement   ADL Overall ADL's : Needs assistance/impaired     Grooming: Wash/dry hands;Wash/dry face;Bed level;Set up Grooming Details (indicate cue type and reason): Setup assist at bed level Upper Body Bathing: Minimal assistance  Lower Body Bathing: Maximal assistance;Cueing for sequencing;Cueing for safety;Bed level;Total assistance Lower Body Bathing Details (indicate cue type and reason): Pt able to roll R and L with use of bed rails, does require max-total assist for hygiene     Lower Body Dressing: Total assistance;Maximal assistance;Bed level                 General ADL Comments: Min  assist for rolling with use of bed rails, did not attempt to sit EOB or get OOB due to fatigue and O2 sats dropping to low 80s with rolling for perihygiene.     Vision Baseline Vision/History: 1 Wears glasses Ability to See in Adequate Light: 0 Adequate Patient Visual Report: No change from baseline              Pertinent Vitals/Pain Pain Assessment Pain Assessment: 0-10 Pain Score: 2  Pain Location: stomach, R hand Pain Descriptors / Indicators: Sore Pain Intervention(s): Monitored during session, Premedicated before session, Limited activity within patient's tolerance     Hand Dominance Right   Extremity/Trunk Assessment Upper Extremity Assessment Upper Extremity Assessment:  (B shoulder limitations due to need for shoulder surgery, currently with recent inject ~2 weeks ago)   Lower Extremity Assessment LLE Deficits / Details: healing wound distal LE, has had numerous L ankle surgeries, minimal motion at L ankle LLE Sensation: decreased proprioception LLE Coordination: decreased gross motor   Cervical / Trunk Assessment Cervical / Trunk Assessment: Kyphotic   Communication Communication Communication: No difficulties   Cognition Arousal/Alertness: Awake/alert Behavior During Therapy: WFL for tasks assessed/performed Overall Cognitive Status: Within Functional Limits for tasks assessed                                                  Home Living Family/patient expects to be discharged to:: Private residence Living Arrangements: Spouse/significant other;Children Available Help at Discharge: Family;Available 24 hours/day Type of Home: House Home Access: Stairs to enter [employer] of Steps: 3 Entrance Stairs-Rails: Can reach both Home Layout: Two level;Full bath on main level;Able to live on main level with bedroom/bathroom     Bathroom Shower/Tub: Walk-in shower;Door   Bathroom Toilet: Handicapped height     Home Equipment:  Rollator (4 wheels);Shower seat - built in;Cane - single point;Wheelchair - manual   Additional Comments: husband has had numerous back surgeries and has difficulty assisting and lifting w/c out of car. They have a ramp that he's working on getting installed      Prior Functioning/Environment Prior Level of Function : Needs assist             Mobility Comments: walks with a rollator but for last several weeks has only been able to transfer ADLs Comments: husband assists with all lower body ADLs, is always present for showering        OT Problem List: Decreased strength;Decreased activity tolerance;Impaired balance (sitting and/or standing);Decreased knowledge of use of DME or AE;Cardiopulmonary status limiting activity;Pain      OT Treatment/Interventions: Self-care/ADL training;Energy conservation;DME and/or AE instruction;Therapeutic activities;Patient/family education    OT Goals(Current goals can be found in the care plan section) Acute Rehab OT Goals Patient Stated Goal: to get better, decrease burden on her husband OT Goal Formulation: With patient Time For Goal Achievement: 02/06/23 Potential to Achieve Goals: Fair  OT Frequency: Min 1X/week  AM-PAC OT "6 Clicks" Daily Activity     Outcome Measure Help from another person eating meals?: A Little Help from another person taking care of personal grooming?: A Little Help from another person toileting, which includes using toliet, bedpan, or urinal?: A Lot Help from another person bathing (including washing, rinsing, drying)?: A Lot Help from another person to put on and taking off regular upper body clothing?: A Little Help from another person to put on and taking off regular lower body clothing?: Total 6 Click Score: 14   End of Session Equipment Utilized During Treatment: Oxygen Nurse Communication: Patient requests pain meds  Activity Tolerance: Patient tolerated treatment well;Other (comment) (O2  sats) Patient left: in bed;with bed alarm set  OT Visit Diagnosis: Unsteadiness on feet (R26.81);Other abnormalities of gait and mobility (R26.89);Muscle weakness (generalized) (M62.81)                Time: 1610-9604 OT Time Calculation (min): 46 min Charges:  OT General Charges $OT Visit: 1 Visit OT Evaluation $OT Eval Moderate Complexity: 1 Mod OT Treatments $Self Care/Home Management : 23-37 mins   Shon Mansouri OTR/L Acute office : [phone] 01/23/2023, 11:07 AM

## 2023-01-23 NOTE — Progress Notes (Signed)
NAME:  [name], MRN:  [id number], DOB:  03/10/1946, LOS: 2 ADMISSION DATE:  01/20/2023, CONSULTATION DATE:  01/21/2023 REFERRING MD:  Dr. Janee Morn, [facility], CHIEF COMPLAINT:  Short of breath   History of Present Illness:  77 yo female with hx of RA, Crohn's disease, ILD, Pulmonary hypertension, OSA and chronic respiratory failure is followed in pulmonary clinic by [name].  She presented to [facility] ER on 7/05 with increased shortness of breath for one week.  She had her prednisone increased to 20 mg daily with initial improvement, but then her breathing got worse.  She has cough with green sputum and feels more fatigued.  CXR showed vascular congestion and she was treated with lasix.  Pertinent  Medical History  Asthma, Allergies, Anxiety, Arthritis, GERD, HTN, IBS, Rheumatoid arthritis, Sleep apnea, Crohn's disease, ILD, OSA, Chronic respiratory failure  Significant Hospital Events:   7/05 Admit 7/7 Unable to tolerate CPAP overnight   Studies:  HRCT chest 05/16/21 >> widespread patchy GGO, septal thickening, subpleural reticulation, regional BTX, air trapping, tracheomalacia RHC 12/17/21 >> RA 7, RV 62/12, PA 63.17 (36), PCW 11, FI 3.8, PRV 3.5 WU PFT 06/07/22 >> FEV1 1.13 (65%), FVC 1.70 (63%), DLCO 36% Echo 11/23/22 >> EF 55 to 60%, grade 1 DD, mod elevation in PASP  Interim History / Subjective:   She is feeling better since last night. Reports clearing thick mucous from her nose and noted feeling better since.   Echo happening at bedside. Husband at bedside.  Objective   Blood pressure 129/86, pulse 79, temperature 98.3 F (36.8 C), temperature source Oral, resp. rate 14, height 5' (1.524 m), weight 75.3 kg, SpO2 92 %.        Intake/Output Summary (Last 24 hours) at 01/23/2023 1722 Last data filed at 01/23/2023 1130 Gross per 24 hour  Intake 600 ml  Output 900 ml  Net -300 ml   Filed Weights   01/21/23 0654 01/22/23 0507 01/23/23 0457  Weight: 86.2 kg 74.4 kg 75.3 kg     Examination: General: Acute on chronically ill appearing elderly female lying in bed, in NAD HEENT: [place]/AT, MM pink/moist, PERRL  Neuro: Alert and oriented x3, non-focal, global weakness  CV: s1s2 regular rate and rhythm, no murmur, rubs, or gallops,  PULM:  Diminished breath sounds bilaterally, on 6L [place], dyspnea with speaking  GI: soft, bowel sounds active in all 4 quadrants, non-tender, non-distended, tolerating oral diet Extremities: warm/dry, no edema  Skin: no rashes or lesions   Resolved Hospital Problem list     Assessment & Plan:   Acute on chronic respiratory failure. - continue supplemental oxygen to keep SpO2 88 to 95% - she is on 6L today (baseline home O2 is 7L)   Interstitial lung disease.  Acute on chronic HFpEF. WHO group 1, 2 and 3 pulmonary hypertension. - continue pirfenidone; okay for her to use her home medication - she hasn't been able to tolerate tyvaso; hold this for now, can be resumed outpatient if needed - repeat Echo pending - continue lasix   Rheumatoid arthritis. - followed by Dr. Zenovia Jordan with rheumatology - continue home leflunomide - Prednisone taper 60mg  daily for 3 days, 40mg  daily for 3 days, 20mg  daily for 3 days, 10 mg daily for 3 days then back to baseline 5mg  daily (taper ordered in [facility])   Obstructive sleep apnea with tracheobronchomalacia. - CPAP at bedtime   Asthma. - yupelri, brovana, pulmicort - prn albuterol   Goals of care. - DNR/DNI  PCCM will continue to follow  Signature:   Melody Comas, MD [facility] Office: [phone]   See Amion for personal pager PCCM on call pager [phone] until 7pm. Please call Elink 7p-7a. [phone]

## 2023-01-23 NOTE — Consult Note (Signed)
WOC Nurse Consult Note: Reason for Consult:LLE wound, chronic, followed by WCC x 31 weeks. Multiple Apligrafts applied. Noted last seen 6/27, orders to leave dressing in place, graft should not be disturbed.  Juxtalite to be worn qAM, removed qPM  Wound type: venous stasis Pressure Injury POA: NA Measurement:see WCC notes 6/27 Wound bed: covered with Apligraft Drainage (amount, consistency, odor) none Periwound: NA Dressing procedure/placement/frequency: Keep wound to LLE covered with foam dressing.   Patient to FU with [facility] as scheduled.   Discussed POC with patient and bedside nurse.  Re consult if needed, will not follow at this time. Thanks  Kathleena Freeman [employer], RN,CWOCN, CNS, CWON-AP [phone])

## 2023-01-23 NOTE — Progress Notes (Signed)
PROGRESS NOTE                                                                                                                                                                                                             Patient Demographics:    [name], is a 77 y.o. female, DOB - [birth year], ZYS:[id number]  Outpatient Primary MD for the patient is Burton Apley, MD    LOS - 2  Admit date - 01/20/2023    Chief Complaint  Patient presents with   Shortness of Breath            Brief Narrative (HPI from H&P)   77 year old female history of pulmonary fibrosis, PAH, HFpEF, RA on chronic prednisone 5 mg daily, chronic hypoxic respiratory failure on 7 L O2 via nasal cannula at baseline, OSA on CPAP presented to the ED with acute on chronic respiratory secondary to ILD and CHF exacerbation. Pulmonary consulted.    Subjective:   Patient in bed, appears comfortable, denies any headache, no fever, no chest pain or pressure, improved shortness of breath , no abdominal pain. No new focal weakness.   Assessment  & Plan :    #1 Acute on chronic hypoxic respiratory failure secondary to ILD/pulmonary fibrosis and HFpEF exacerbation  - Acute presentation multifactorial secondary to ILD and due to on chronic diastolic CHF EF 60% on echocardiogram done few months ago in May 2024, pulmonary critical care following the patient, she has advanced ILD at baseline and uses 7 L of oxygen at home, currently being diuresed with Lasix with good effect, continue supplemental oxygen, on IV steroids along with nebulizer treatments.  No signs of active infection, supplemental CPAP/BiPAP or NRB as needed.  She is DNR.  With IV steroids and diuretics some improvement on 01/22/2023.  2.  Diarrhea  - Norovirus infection present on admission, as needed Imodium, resolved.   3.  Leukocytosis -Likely secondary to recent steroid use. -Afebrile continue to  monitor   4.  Pulmonary hypertension -Verapamil on hold due to soft blood pressure on presentation. -Tyvaso ordered however pharmacy awaiting pulmonary input.   5.  Rheumatoid arthritis -Leflunomide. -Currently on IV Solu-Medrol.  Chronically on 5 mg of prednisone.   6.  Hyperlipidemia  - Statin.   7.  Depression/anxiety  - Prozac.   8.  OSA - Noted  to be on CPAP nightly however due to problem #1 and requiring BiPAP will place on BiPAP nightly.   9.  Chronic pain -Continue current home pain regiment.        Condition - Extremely Guarded  Family Communication  :  None  Code Status : DNR  Consults  :  PCCM  PUD Prophylaxis :  PPI   Procedures  :            Disposition Plan  :    Status is: Inpatient  DVT Prophylaxis  :    enoxaparin (LOVENOX) injection 40 mg Start: 01/21/23 2200    Lab Results  Component Value Date   PLT 333 01/23/2023    Diet :  Diet Order             Diet Heart Room service appropriate? Yes; Fluid consistency: Thin  Diet effective now                    Inpatient Medications  Scheduled Meds:  arformoterol  15 mcg Nebulization BID   bacitracin   Topical BID   budesonide (PULMICORT) nebulizer solution  0.5 mg Nebulization BID   enoxaparin (LOVENOX) injection  40 mg Subcutaneous Q24H   FLUoxetine  20 mg Oral Daily   furosemide  40 mg Intravenous Daily   leflunomide  20 mg Oral Daily   loratadine  10 mg Oral Daily   montelukast  10 mg Oral QHS   oxyCODONE  10 mg Oral TID   pantoprazole  40 mg Oral Q0600   Pirfenidone  801 mg Oral TID   predniSONE  60 mg Oral Q breakfast   revefenacin  175 mcg Nebulization Daily   rosuvastatin  20 mg Oral Daily   sodium chloride flush  3 mL Intravenous Q12H   traZODone  100 mg Oral QHS   verapamil  120 mg Oral Daily   Continuous Infusions:   PRN Meds:.acetaminophen **OR** acetaminophen, albuterol, loperamide, ondansetron **OR** ondansetron (ZOFRAN) IV, senna-docusate, sodium  chloride  Antibiotics  :    Anti-infectives (From admission, onward)    None         Objective:   Vitals:   01/23/23 0457 01/23/23 0503 01/23/23 0900 01/23/23 0903  BP:  (!) 125/108  104/88  Pulse: 75 77    Resp: 15 16    Temp:  (!) 97.3 F (36.3 C) (!) 97.5 F (36.4 C)   TempSrc:  Oral Oral   SpO2: 98% 98%    Weight: 75.3 kg     Height:        Wt Readings from Last 3 Encounters:  01/23/23 75.3 kg  12/16/22 79.4 kg  11/01/22 84.1 kg     Intake/Output Summary (Last 24 hours) at 01/23/2023 0908 Last data filed at 01/23/2023 0012 Gross per 24 hour  Intake 81.58 ml  Output 300 ml  Net -218.42 ml     Physical Exam  Awake Alert, No new F.N deficits, Normal affect [place].AT,PERRAL Supple Neck, No JVD,   Symmetrical Chest wall movement, Good air movement bilaterally, CTAB RRR,No Gallops,Rubs or new Murmurs,  +ve B.Sounds, Abd Soft, No tenderness,   No Cyanosis, Clubbing or edema       Data Review:    Recent Labs  Lab 01/20/23 2206 01/20/23 2211 01/21/23 0033 01/22/23 0513 01/23/23 0746  WBC 14.9*  --  12.4* 9.0 6.2  HGB 10.6* 11.9* 10.5* 10.6* 11.4*  HCT 34.7* 35.0* 34.8* 32.7* 35.6*  PLT 344  --  314 303 333  MCV 93.3  --  94.1 89.8 92.0  MCH 28.5  --  28.4 29.1 29.5  MCHC 30.5  --  30.2 32.4 32.0  RDW 14.2  --  14.2 14.0 14.1  LYMPHSABS  --   --   --  0.5* 0.3*  MONOABS  --   --   --  1.2* 0.7  EOSABS  --   --   --  0.0 0.0  BASOSABS  --   --   --  0.0 0.0    Recent Labs  Lab 01/20/23 2206 01/20/23 2211 01/21/23 0033 01/22/23 0513 01/22/23 0616 01/22/23 1431  NA 131* 128* 133* 132*  --   --   K 3.9 3.9 4.0 2.6*  --  3.8  CL 93*  --  95* 88*  --   --   CO2 23  --  22 31  --   --   ANIONGAP 15  --  16* 13  --   --   GLUCOSE 85  --  57* 114*  --   --   BUN 16  --  17 18  --   --   CREATININE 0.81  --  0.84 0.85  --   --   CRP  --   --   --   --  8.2*  --   PROCALCITON  --   --   --   --  0.21  --   BNP 495.9*  --   --   --  240.9*  --    MG  --   --   --   --  1.8  --   CALCIUM 10.2  --  9.9 9.4  --   --       Recent Labs  Lab 01/20/23 2206 01/21/23 0033 01/22/23 0513 01/22/23 0616  CRP  --   --   --  8.2*  PROCALCITON  --   --   --  0.21  BNP 495.9*  --   --  240.9*  MG  --   --   --  1.8  CALCIUM 10.2 9.9 9.4  --     Radiology Reports DG Chest Port 1 View  Result Date: 01/22/2023 CLINICAL DATA:  Short of breath EXAM: PORTABLE CHEST 1 VIEW COMPARISON:  None Available. FINDINGS: Normal cardiac silhouette. LEFT basilar atelectasis similar prior. Chronic bronchitic markings. No effusion, pulmonary edema or infiltrate. No acute osseous abnormality. IMPRESSION: 1. LEFT basilar atelectasis. 2. Chronic bronchitic markings. Electronically Signed   By: Genevive Bi M.D.   On: 01/22/2023 09:10   DG Abd 1 View  Result Date: 01/21/2023 CLINICAL DATA:  Abdominal pain. EXAM: ABDOMEN - 1 VIEW COMPARISON:  None Available. FINDINGS: No bowel dilatation to suggest obstruction. Small volume of formed stool in the colon. Surgical clips in the right upper quadrant, typical of cholecystectomy. Lumbosacral fusion hardware. No definite radiopaque calculi. IMPRESSION: Normal bowel gas pattern.  No radiographic explanation for pain. Electronically Signed   By: Narda Rutherford M.D.   On: 01/21/2023 21:26   DG Chest Portable 1 View  Result Date: 01/20/2023 CLINICAL DATA:  Shortness of breath EXAM: PORTABLE CHEST 1 VIEW COMPARISON:  04/20/2022 FINDINGS: Cardiomegaly with vascular congestion. Perihilar and infrahilar opacities could reflect edema. Peripheral coarsened opacities compatible with fibrosis as seen on prior CT. No effusions. Aortic atherosclerosis. No acute bony abnormality. IMPRESSION: Cardiomegaly with vascular congestion and perihilar opacities which could reflect pulmonary edema. Chronic changes/fibrosis. Electronically Signed  By: Charlett Nose M.D.   On: 01/20/2023 22:44      Signature  -   Susa Raring M.D on 01/23/2023 at  9:08 AM   -  To page go to [email or web address]

## 2023-01-23 NOTE — Plan of Care (Signed)

## 2023-01-23 NOTE — Progress Notes (Signed)
   01/23/23 2017  BiPAP/CPAP/SIPAP  Reason BIPAP/CPAP not in use Non-compliant

## 2023-01-23 NOTE — Progress Notes (Signed)
  Echocardiogram 2D Echocardiogram has been performed.  Delcie Roch 01/23/2023, 5:49 PM

## 2023-01-23 NOTE — Consult Note (Signed)
Consultation Note Date: 01/23/2023   Patient Name: [name]  DOB: [birth year]  MRN: [id number]  Age / Sex: 77 y.o., female  PCP: Burton Apley, MD Referring Physician: Leroy Sea, MD  Reason for Consultation:  assist with GOC  HPI/Patient Profile: 77 y.o. female  with past medical history of Crohn's disease, ILD, pulmonary HTN, OSA, chronic respiratory failure, LLE wound + for MRSA being treated at [facility]ound clinic with Apligraf admitted on 01/20/2023 with shortness of breath and increased fatigue.  Workup reveals CHF and ILD/PF exacerbation in the setting of norovirus and coronavirus HKU1. Palliative medicine consulted for GOC.    Primary Decision Maker PATIENT - surrogate decision maker is spouse  Discussion: Chart reviewed including labs, progress notes, imaging from this and previous encounters.   Met at bedside with patient and spouse. Introduced Palliative medicine.   Discussed current illness and possible trajectories. [name] reports feeling better today after clearing some nasal congestion that she had.  She has little appetite and is passing gas with incontinence of stool at times- we discussed this is expected with her norovirus infection.   Advanced care planning and goc discussed. Sincere had some questions about DNR- she first stated she felt pushed into it, but after further discussion with her and her spouse she is at ease with the decision and wishes to continue with DNR order.   Her GOC are to be able to watch tv, interact with family, play [employer]. She is aware of recommendations for SNF.   She had questions about her wound on her LLE, would like wound RN consult.       SUMMARY OF RECOMMENDATIONS -CHF/ILD exacerbation in the setting of norovirus and coronavirus HKU1 (not Covid) - continue current treatment- GOC continue to be life prolonging with limit set at DNR -LLE wound  with graft in place- will place wound care consult for followup -Likely to d/c to SNF for rehab -PMT will continue to follow     Code Status/Advance Care Planning: DNR   Prognosis:   Unable to determine  Discharge Planning:  SNF for rehab  Primary Diagnoses: Present on Admission:  Acute on chronic hypoxic respiratory failure (HCC)  ILD (interstitial lung disease) (HCC)  Pulmonary hypertension (HCC)  Rheumatoid arthritis (HCC)  Hyperlipidemia LDL goal <70  ANXIETY DEPRESSION   Review of Systems  Constitutional:  Positive for activity change and appetite change.  Respiratory:  Positive for shortness of breath.     Physical Exam Vitals and nursing note reviewed.  Constitutional:      General: She is not in acute distress.    Appearance: She is not ill-appearing.  Pulmonary:     Effort: Pulmonary effort is normal.  Neurological:     Mental Status: She is alert and oriented to person, place, and time.  Psychiatric:        Mood and Affect: Mood normal.        Behavior: Behavior normal.        Thought Content: Thought content normal.  Judgment: Judgment normal.     Vital Signs: BP 114/65 (BP Location: Right Arm)   Pulse 92   Temp 98.4 F (36.9 C) (Oral)   Resp 19   Ht 5' (1.524 m)   Wt 75.3 kg   SpO2 96%   BMI 32.42 kg/m  Pain Scale: 0-10   Pain Score: 0-No pain   SpO2: SpO2: 96 % O2 Device:SpO2: 96 % O2 Flow Rate: .O2 Flow Rate (L/min): 6 L/min  IO: Intake/output summary:  Intake/Output Summary (Last 24 hours) at 01/23/2023 1323 Last data filed at 01/23/2023 1130 Gross per 24 hour  Intake 600 ml  Output 900 ml  Net -300 ml    LBM: Last BM Date : 01/22/23 Baseline Weight: Weight: 86.2 kg Most recent weight: Weight: 75.3 kg       Thank you for this consult. Palliative medicine will continue to follow and assist as needed.  Time Total: 90 minutes Greater than 50%  of this time was spent counseling and coordinating care related to the above  assessment and plan.  Signed by: Ocie Bob, AGNP-C Palliative Medicine    Please contact Palliative Medicine Team phone at [phone] for questions and concerns.  For individual provider: See Loretha Stapler

## 2023-01-23 NOTE — TOC Initial Note (Signed)
Transition of Care [facility]) - Initial/Assessment Note    Patient Details  Name: [name] MRN: [id number] Date of Birth: 01/30/1946  Transition of Care [facility]) CM/SW Contact:    Mearl Latin, LCSW Phone Number: 01/23/2023, 3:20 PM  Clinical Narrative:                 Patient's spouse requesting SNF and patient is aware. CSW attempted to contact spouse but voicemail is full. CSW will complete SNF referral and present bed offers as available.   Expected Discharge Plan: Skilled Nursing Facility Barriers to Discharge: Continued Medical Work up, SNF Pending bed offer   Patient Goals and CMS Choice Patient states their goals for this hospitalization and ongoing recovery are:: Rehab CMS Medicare.gov Compare Post Acute Care list provided to:: Patient Choice offered to / list presented to : Patient [place] ownership interest in [facility].provided to:: Patient    Expected Discharge Plan and Services In-house Referral: Clinical Social Work   Post Acute Care Choice: Skilled Nursing Facility Living arrangements for the past 2 months: Single Family Home                                      Prior Living Arrangements/Services Living arrangements for the past 2 months: Single Family Home Lives with:: Spouse Patient language and need for interpreter reviewed:: Yes Do you feel safe going back to the place where you live?: Yes      Need for Family Participation in Patient Care: Yes (Comment) Care giver support system in place?: Yes (comment)   Criminal Activity/Legal Involvement Pertinent to Current Situation/Hospitalization: No - Comment as needed  Activities of Daily Living Home Assistive Devices/Equipment: None ADL Screening (condition at time of admission) Patient's cognitive ability adequate to safely complete daily activities?: Yes Is the patient deaf or have difficulty hearing?: No Does the patient have difficulty seeing, even when wearing glasses/contacts?:  No Does the patient have difficulty concentrating, remembering, or making decisions?: No Patient able to express need for assistance with ADLs?: Yes Does the patient have difficulty dressing or bathing?: Yes Independently performs ADLs?: Yes (appropriate for developmental age) Does the patient have difficulty walking or climbing stairs?: Yes Weakness of Legs: Both Weakness of Arms/Hands: Both  Permission Sought/Granted Permission sought to share information with : Facility Medical sales representative, Family Supports Permission granted to share information with : Yes, Verbal Permission Granted  Share Information with NAME: Molly Maduro Nadine Counts)  Permission granted to share info w AGENCY: SNFs  Permission granted to share info w Relationship: Spouse  Permission granted to share info w Contact Information: [phone]  Emotional Assessment Appearance:: Appears stated age Attitude/Demeanor/Rapport: Engaged Affect (typically observed): Accepting Orientation: : Oriented to Self, Oriented to Place, Oriented to  Time, Oriented to Situation Alcohol / Substance Use: Not Applicable Psych Involvement: No (comment)  Admission diagnosis:  Acute on chronic respiratory failure with hypoxia (HCC) [J96.21] Acute on chronic hypoxic respiratory failure (HCC) [J96.21] Patient Active Problem List   Diagnosis Date Noted   Acute on chronic hypoxic respiratory failure (HCC) 01/21/2023   Diarrhea 01/21/2023   Wound of left leg 05/17/2022   Medication monitoring encounter 05/17/2022   Acute on chronic diastolic CHF (congestive heart failure) (HCC) 04/22/2022   Hypokalemia 04/22/2022   Petechial rash 04/21/2022   Leukocytosis 04/21/2022   Cellulitis of left lower extremity 04/21/2022   Elevated troponin 04/21/2022   Acute on chronic  heart failure with preserved ejection fraction (HFpEF) (HCC) 04/20/2022   Mild persistent asthma without complication 10/02/2019   Therapeutic drug monitoring 10/02/2019   Chronic  respiratory failure with hypoxia (HCC) 03/07/2019   Physical deconditioning 02/11/2019   Obese 12/21/2018   S/P right THA, AA 12/20/2018   Pulmonary fibrosis (HCC) 10/12/2018   Chronic heart failure with preserved ejection fraction (HFpEF) (HCC) 10/12/2018   Acute on chronic respiratory failure with hypoxia (HCC) 08/29/2018   Bronchiectasis without complication (HCC) 08/29/2018   Wound infection after surgery 05/09/2018   Hyperlipidemia LDL goal <70 01/09/2017   Ankle arthritis 12/19/2016   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/2016   Hallux valgus of left foot 12/19/2016   Painful orthopaedic hardware (HCC) 12/19/2016   Pes planus 12/19/2016   Subluxation of interphalangeal joint of lesser toe of left foot 12/19/2016   Pulmonary hypertension (HCC) 10/25/2016   Coronary artery disease involving native coronary artery of native heart without angina pectoris 10/25/2016   Shortness of breath 09/12/2016   Leg edema 09/12/2016   Obstructive sleep apnea 09/12/2016   ILD (interstitial lung disease) (HCC) 09/19/2015   Thrush, oral 06/12/2014   Influenza A with pneumonia 08/02/2013   CAP (community acquired pneumonia) 07/31/2013   Hypoxia 07/31/2013   Non-healing wound of left lower extremity 11/15/2012   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 AT CORNER OF [facility] [address] [place] [address] Phone: [phone] Fax: [phone]  [facility]) - [place], [place] - [address] [facility] [address] [place] [address] [place] [address] Phone: [phone] Fax: [phone]  [facility] - [place], [place] - [address] [place] [address] Phone: [phone] Fax: [phone]  [facility] - [place], [place] - [address] [place] [address] Phone: [phone] Fax: [phone]     Social Determinants of Health (SDOH) Social History: SDOH Screenings   Food Insecurity: No Food Insecurity (01/21/2023)  Housing: Low Risk  (01/21/2023)  Transportation Needs: No Transportation Needs (01/21/2023)  Utilities: Not At Risk (01/21/2023)  Depression (PHQ2-9): Low Risk  (05/17/2022)  Tobacco Use: Low Risk  (01/20/2023)   SDOH Interventions:     Readmission Risk Interventions     No data to display

## 2023-01-24 ENCOUNTER — Telehealth: Payer: Self-pay | Admitting: Pulmonary Disease

## 2023-01-24 DIAGNOSIS — J9621 Acute and chronic respiratory failure with hypoxia: Secondary | ICD-10-CM | POA: Diagnosis not present

## 2023-01-24 LAB — CBC WITH DIFFERENTIAL/PLATELET
Abs Immature Granulocytes: 0.1 10*3/uL — ABNORMAL HIGH (ref 0.00–0.07)
Basophils Absolute: 0 10*3/uL (ref 0.0–0.1)
Basophils Relative: 0 %
Eosinophils Absolute: 0 10*3/uL (ref 0.0–0.5)
Eosinophils Relative: 0 %
HCT: 33.4 % — ABNORMAL LOW (ref 36.0–46.0)
Hemoglobin: 10.5 g/dL — ABNORMAL LOW (ref 12.0–15.0)
Immature Granulocytes: 1 %
Lymphocytes Relative: 4 %
Lymphs Abs: 0.4 10*3/uL — ABNORMAL LOW (ref 0.7–4.0)
MCH: 28.2 pg (ref 26.0–34.0)
MCHC: 31.4 g/dL (ref 30.0–36.0)
MCV: 89.8 fL (ref 80.0–100.0)
Monocytes Absolute: 1.1 10*3/uL — ABNORMAL HIGH (ref 0.1–1.0)
Monocytes Relative: 13 %
Neutro Abs: 6.9 10*3/uL (ref 1.7–7.7)
Neutrophils Relative %: 82 %
Platelets: 322 10*3/uL (ref 150–400)
RBC: 3.72 MIL/uL — ABNORMAL LOW (ref 3.87–5.11)
RDW: 13.9 % (ref 11.5–15.5)
WBC: 8.5 10*3/uL (ref 4.0–10.5)
nRBC: 0.2 % (ref 0.0–0.2)

## 2023-01-24 LAB — BASIC METABOLIC PANEL
Anion gap: 9 (ref 5–15)
BUN: 15 mg/dL (ref 8–23)
CO2: 33 mmol/L — ABNORMAL HIGH (ref 22–32)
Calcium: 9.5 mg/dL (ref 8.9–10.3)
Chloride: 87 mmol/L — ABNORMAL LOW (ref 98–111)
Creatinine, Ser: 0.83 mg/dL (ref 0.44–1.00)
GFR, Estimated: >60 mL/min (ref >60)
Glucose, Bld: 127 mg/dL — ABNORMAL HIGH (ref 70–99)
Potassium: 3.6 mmol/L (ref 3.5–5.1)
Sodium: 129 mmol/L — ABNORMAL LOW (ref 135–145)

## 2023-01-24 LAB — SODIUM, URINE, RANDOM: Sodium, Ur: <10 mmol/L

## 2023-01-24 LAB — URIC ACID: Uric Acid, Serum: 3.2 mg/dL (ref 2.5–7.1)

## 2023-01-24 LAB — BRAIN NATRIURETIC PEPTIDE: B Natriuretic Peptide: 166.8 pg/mL — ABNORMAL HIGH (ref 0.0–100.0)

## 2023-01-24 LAB — OSMOLALITY, URINE: Osmolality, Ur: 181 mOsm/kg — ABNORMAL LOW (ref 300–900)

## 2023-01-24 LAB — OSMOLALITY: Osmolality: 282 mOsm/kg (ref 275–295)

## 2023-01-24 LAB — C-REACTIVE PROTEIN: CRP: 1.9 mg/dL — ABNORMAL HIGH (ref <1.0)

## 2023-01-24 LAB — CREATININE, URINE, RANDOM: Creatinine, Urine: 24 mg/dL

## 2023-01-24 LAB — MAGNESIUM: Magnesium: 2.1 mg/dL (ref 1.7–2.4)

## 2023-01-24 MED ORDER — FUROSEMIDE 20 MG PO TABS
20.0000 mg | ORAL_TABLET | Freq: Every day | ORAL | Status: DC
  Administered 2023-01-24 – 2023-01-25 (×2): 20 mg via ORAL
  Filled 2023-01-24 (×2): qty 1

## 2023-01-24 MED ORDER — ESBRIET 801 MG PO TABS
801.0000 mg | ORAL_TABLET | Freq: Three times a day (TID) | ORAL | 1 refills | Status: DC
Start: 2023-01-24 — End: 2023-02-17

## 2023-01-24 MED ORDER — SODIUM CHLORIDE 0.9 % IV SOLN
1.0000 g | Freq: Three times a day (TID) | INTRAVENOUS | Status: DC
  Administered 2023-01-24 – 2023-01-25 (×3): 1 g via INTRAVENOUS
  Filled 2023-01-24 (×4): qty 20

## 2023-01-24 MED ORDER — LACTATED RINGERS IV SOLN: INTRAVENOUS | Status: DC

## 2023-01-24 NOTE — Progress Notes (Signed)
Heart Failure Navigator Progress Note  Assessed for Heart & Vascular TOC clinic readiness.  Patient does not meet criteria due to Advanced Heart Failure Team patient of Dr. Bensimhon.   Navigator will sign off at this time.   Brasen Bundren, BSN, RN Heart Failure Nurse Navigator Secure Chat Only   

## 2023-01-24 NOTE — Progress Notes (Signed)
Physical Therapy Treatment Patient Details Name: [name] MRN: [id number] DOB: 10-May-1946 Today's Date: 01/24/2023   History of Present Illness Patient is a 77 yo female presenting on 01/20/23 with SOB. PMH includes: Crohn's, HTN, HFpEF, asthmatic bronchitis, PAH, ILD, chronic respiratory failure on 7 L of oxygen, rheumatoid arthritis on chronic immunosuppression, and OSA    PT Comments  Pt received in supine, agreeable to therapy session with encouragement, pt expressing fear of falls but with improved tolerance for seated/standing trials this date compared with OT session yesterday. Pt needing up to modA for bed mobility and up to minA for sit<>stand with RW after extensive amount of time preparing and discussing safety and proper technique. Pt able to take 2-3 lateral steps along EOB toward HOB with minA and RW to simulate step pivot transfer, but defers OOB to chair transfer due to fatigue after exercises and transfer training. Pt placed in bed chair posture and encouraged to attempt eating some of her lunch, she states her spouse ate half of it and she was reluctant to eat but reports she would try. Pt continues to benefit from PT services to progress toward functional mobility goals, continue to recommend short term lower intensity post-acute rehab upon DC.    Assistance Recommended at Discharge Frequent or constant Supervision/Assistance  If plan is discharge home, recommend the following:  Can travel by private vehicle    Two people to help with walking and/or transfers;A lot of help with bathing/dressing/bathroom;Assist for transportation;Help with stairs or ramp for entrance;Assistance with cooking/housework   No (likely to need PTAR)  Equipment Recommendations  None recommended by PT    Recommendations for Other Services       Precautions / Restrictions Precautions Precautions: Fall Precaution Comments: Fear of falls; Enteric precs Restrictions Weight Bearing Restrictions:  No     Mobility  Bed Mobility Overal bed mobility: Needs Assistance Bed Mobility: Rolling, Sit to Sidelying, Supine to Sit Rolling: Min assist, Min guard   Supine to sit: Min assist, HOB elevated   Sit to sidelying: Mod assist General bed mobility comments: Pt able to roll L/R min A with use of bed rails. Pt needing BLE assist to return to supine via log roll for comfort, use of bed rails to raise and to lower trunk. Good initiation sitting up.    Transfers Overall transfer level: Needs assistance Equipment used: Rolling walker (2 wheels) Transfers: Sit to/from Stand, Bed to chair/wheelchair/BSC Sit to Stand: Min assist, From elevated surface   Step pivot transfers: Min assist, From elevated surface       General transfer comment: from EOB<>RW with increased time/cues and minA, pt performed x3 squats prior to building her confidence to stand fully upright which seemed to help, as pt verbalized fear of falls prior to attempting. Once standing, pt taking lateral steps toward her L side to [facility] to simulate step pivot transfer. Pt defer OOB to chair due to c/o fatigue, but agreeable to be placed in bed chair posture at end of session.    Ambulation/Gait               General Gait Details: Pt has been non-ambulatory for more than two weeks per her report.   Stairs             Wheelchair Mobility     Tilt Bed    Modified Rankin (Stroke Patients Only)       Balance Overall balance assessment: Needs assistance Sitting-balance support: Single extremity supported, Feet  supported Sitting balance-Leahy Scale: Fair     Standing balance support: Bilateral upper extremity supported, During functional activity Standing balance-Leahy Scale: Poor Standing balance comment: heavy reliance on RW and up to minA for stability. pt verbalizes fear of falls before and while standing.                            Cognition Arousal/Alertness: Awake/alert Behavior  During Therapy: WFL for tasks assessed/performed Overall Cognitive Status: Within Functional Limits for tasks assessed                                          Exercises Other Exercises Other Exercises: seated BLE AROM: LAQ x10 reps ea Other Exercises: chair push-ups (tricep extension) seated EOB x5 reps with rest breaks PRN Other Exercises: supine BLE AROM: ankle pumps (on RLE only -LLE hx fixation surgery) heel slides, hip aB/adduction x10 reps ea Other Exercises: pursed lip breathing with shoulder shrugs x10 reps seated (5 reps x2 sets)    General Comments General comments (skin integrity, edema, etc.): Pt reports some vertigo type symptoms with postural changes, pt had some difficulty with horizontal saccades, may benefit from vestibular PT evaluation to further assess vestibular function. SpO2 desat to 87% on 6L O2 [place] with transition to sitting EOB, on 7L  pt SpO2 improved to 88-92% with static and dynamic seated/standing tasks. Per chart review her O2 goal is 88-95%. HR observed to 116 bpm with exertion. BP stable SBP 141 while seated EOB      Pertinent Vitals/Pain Pain Assessment Pain Assessment: Faces Faces Pain Scale: Hurts a little bit Pain Location: abdomen where briefs breaking down skin under skin pannus, more to the L side Pain Descriptors / Indicators: Grimacing, Discomfort Pain Intervention(s): Monitored during session, Repositioned, Other (comment) (used scissors to make incision in top of briefs to loosen them and applied barrier ointment over reddened area, RN notified as well)    Home Living                          Prior Function            PT Goals (current goals can now be found in the care plan section) Acute Rehab PT Goals Patient Stated Goal: To get stronger before I return home PT Goal Formulation: With patient Time For Goal Achievement: 02/05/23 Progress towards PT goals: Progressing toward goals    Frequency    Min  3X/week      PT Plan Current plan remains appropriate    Co-evaluation              AM-PAC PT "6 Clicks" Mobility   Outcome Measure  Help needed turning from your back to your side while in a flat bed without using bedrails?: A Lot Help needed moving from lying on your back to sitting on the side of a flat bed without using bedrails?: A Lot Help needed moving to and from a bed to a chair (including a wheelchair)?: Total Help needed standing up from a chair using your arms (e.g., wheelchair or bedside chair)?: A Lot (dense cues to prepare) Help needed to walk in hospital room?: Total Help needed climbing 3-5 steps with a railing? : Total 6 Click Score: 9    End of Session Equipment Utilized During Treatment: Gait belt;Oxygen  Activity Tolerance: Patient tolerated treatment well;Patient limited by fatigue Patient left: in bed;with call bell/phone within reach;with bed alarm set;with family/visitor present;Other (comment) (pt heels floated, bed in chair posture) Nurse Communication: Mobility status;Other (comment);Precautions (pt skin breakdown on L abdomen around briefs, may need foam dressing; O2 increased to 7L) PT Visit Diagnosis: Unsteadiness on feet (R26.81);History of falling (Z91.81);Muscle weakness (generalized) (M62.81);Difficulty in walking, not elsewhere classified (R26.2);Pain Pain - Right/Left: Left Pain - part of body:  (abdomen/skin breakdown)     Time: 1308-6578 PT Time Calculation (min) (ACUTE ONLY): 43 min  Charges:    $Therapeutic Exercise: 8-22 mins $Therapeutic Activity: 23-37 mins PT General Charges $$ ACUTE PT VISIT: 1 Visit                     Tyric Rodeheaver P., PTA Acute Rehabilitation Services Secure Chat Preferred 9a-5:30pm Office: [phone]    Dorathy Kinsman [facility] 01/24/2023, 2:42 PM

## 2023-01-24 NOTE — Progress Notes (Signed)
NAME:  [name], MRN:  [id number], DOB:  Oct 24, 1945, LOS: 3 ADMISSION DATE:  01/20/2023, CONSULTATION DATE:  01/21/2023 REFERRING MD:  Dr. Janee Morn, [facility], CHIEF COMPLAINT:  Short of breath   History of Present Illness:  77 yo female with hx of RA, Crohn's disease, ILD, Pulmonary hypertension, OSA and chronic respiratory failure is followed in pulmonary clinic by [name].  She presented to [facility] ER on 7/05 with increased shortness of breath for one week.  She had her prednisone increased to 20 mg daily with initial improvement, but then her breathing got worse.  She has cough with green sputum and feels more fatigued.  CXR showed vascular congestion and she was treated with lasix.  Pertinent  Medical History  Asthma, Allergies, Anxiety, Arthritis, GERD, HTN, IBS, Rheumatoid arthritis, Sleep apnea, Crohn's disease, ILD, OSA, Chronic respiratory failure  Significant Hospital Events:   7/05 Admit 7/7 Unable to tolerate CPAP overnight   Studies:  HRCT chest 05/16/21 >> widespread patchy GGO, septal thickening, subpleural reticulation, regional BTX, air trapping, tracheomalacia RHC 12/17/21 >> RA 7, RV 62/12, PA 63.17 (36), PCW 11, FI 3.8, PRV 3.5 WU PFT 06/07/22 >> FEV1 1.13 (65%), FVC 1.70 (63%), DLCO 36% Echo 11/23/22 >> EF 55 to 60%, grade 1 DD, mod elevation in PASP Echo 01/23/23 > > EF 40-45%, D shaped RV septum suggesting pressure/volume overload. RV function is severely reduced.   Interim History / Subjective:   She is feeling ok today Still does not have much of an appetite  Objective   Blood pressure (!) 157/119, pulse 76, temperature 98 F (36.7 C), temperature source Oral, resp. rate 17, height 5' (1.524 m), weight 75.1 kg, SpO2 90 %.       No intake or output data in the 24 hours ending 01/24/23 1419  Filed Weights   01/22/23 0507 01/23/23 0457 01/24/23 0500  Weight: 74.4 kg 75.3 kg 75.1 kg    Examination: General: Acute on chronically ill appearing elderly female  lying in bed, in NAD HEENT: [place]/AT, MM pink/moist, PERRL  Neuro: Alert and oriented x3, non-focal, global weakness  CV: s1s2 regular rate and rhythm, no murmur, rubs, or gallops,  PULM:  Diminished breath sounds bilaterally, on 6L [place], dyspnea with speaking  GI: soft, bowel sounds active in all 4 quadrants, non-tender, non-distended, tolerating oral diet Extremities: warm/dry, no edema  Skin: no rashes or lesions   Resolved Hospital Problem list     Assessment & Plan:   Acute on chronic respiratory failure. - continue supplemental oxygen to keep SpO2 88 to 95% - she is on 6-&L (baseline home O2 is 7L)   Interstitial lung disease.  Acute on chronic HFpEF. WHO group 1, 2 and 3 pulmonary hypertension. - continue pirfenidone; okay for her to use her home medication - she hasn't been able to tolerate tyvaso; hold this for now, can be resumed outpatient if needed - Echo with worsening in her heart function with reduced LV and severely reduced RV with signs of pressure overload - resume lasix, 20mg  daily PO   Rheumatoid arthritis. - followed by Dr. Zenovia Jordan with rheumatology - continue home leflunomide - Prednisone taper 60mg  daily for 3 days, 40mg  daily for 3 days, 20mg  daily for 3 days, 10 mg daily for 3 days then back to baseline 5mg  daily (taper ordered in [facility])   Obstructive sleep apnea with tracheobronchomalacia. - CPAP at bedtime   Asthma. - yupelri, brovana, pulmicort - prn albuterol   Goals of  care. - DNR/DNI  I will setup outpatient follow up with [name].  Signature:   Melody Comas, MD [facility] Office: [phone]   See Amion for personal pager PCCM on call pager [phone] until 7pm. Please call Elink 7p-7a. [phone]

## 2023-01-24 NOTE — Telephone Encounter (Signed)
Please schedule patient for hospital follow up in 1-3 weeks for ILD and pulmonary hypertension. She has had changes in her recent Echo with worsening LV function and RV pressure overload. She will need follow up in regards to if she can continue Tyvasso and if she needs to continue daily diuretic.    Thanks,  JD

## 2023-01-24 NOTE — TOC Progression Note (Addendum)
Transition of Care [facility]) - Initial/Assessment Note    Patient Details  Name: [name] MRN: [id number] Date of Birth: May 21, 1946  Transition of Care [facility]) CM/SW Contact:    Ralene Bathe, LCSW Phone Number: 01/24/2023, 12:04 PM  Clinical Narrative:                 LCSW met with patient and spouse at bedside to present SNF bed offers.  The spouse inquired about inpatient rehab.  LCSW messaged MD and PT to inform of request.  If [facility] is unable to accept, the family is agreeable to [facility].   Addendum 13:30-  LCSW contacted admissions at [facility].  The facility can accept the patient tomorrow if CIR is unable to accept.  Patient's spouse and care team updated.    Addendum 13:50-  CSW spoke with patient's spouse.  The patient and spouse are agreeable to [name] rather than [facility].  Care Team notified.  The patient's spouse was also informed that the facility is requesting that the patient's CPAP be brought from home to the facility for patient to use.  Spouse is agreeable. Facility notified.   TOC following.    Expected Discharge Plan: Skilled Nursing Facility Barriers to Discharge: Continued Medical Work up, SNF Pending bed offer   Patient Goals and CMS Choice Patient states their goals for this hospitalization and ongoing recovery are:: Rehab CMS Medicare.gov Compare Post Acute Care list provided to:: Patient Choice offered to / list presented to : Patient [place] ownership interest in [facility].provided to:: Patient    Expected Discharge Plan and Services In-house Referral: Clinical Social Work   Post Acute Care Choice: Skilled Nursing Facility Living arrangements for the past 2 months: Single Family Home Expected Discharge Date: 01/24/23                                    Prior Living Arrangements/Services Living arrangements for the past 2 months: Single Family Home Lives with:: Spouse Patient language and  need for interpreter reviewed:: Yes Do you feel safe going back to the place where you live?: Yes      Need for Family Participation in Patient Care: Yes (Comment) Care giver support system in place?: Yes (comment)   Criminal Activity/Legal Involvement Pertinent to Current Situation/Hospitalization: No - Comment as needed  Activities of Daily Living Home Assistive Devices/Equipment: None ADL Screening (condition at time of admission) Patient's cognitive ability adequate to safely complete daily activities?: Yes Is the patient deaf or have difficulty hearing?: No Does the patient have difficulty seeing, even when wearing glasses/contacts?: No Does the patient have difficulty concentrating, remembering, or making decisions?: No Patient able to express need for assistance with ADLs?: Yes Does the patient have difficulty dressing or bathing?: Yes Independently performs ADLs?: Yes (appropriate for developmental age) Does the patient have difficulty walking or climbing stairs?: Yes Weakness of Legs: Both Weakness of Arms/Hands: Both  Permission Sought/Granted Permission sought to share information with : Facility Medical sales representative, Family Supports Permission granted to share information with : Yes, Verbal Permission Granted  Share Information with NAME: Molly Maduro Nadine Counts)  Permission granted to share info w AGENCY: SNFs  Permission granted to share info w Relationship: Spouse  Permission granted to share info w Contact Information: [phone]  Emotional Assessment Appearance:: Appears stated age Attitude/Demeanor/Rapport: Engaged Affect (typically observed): Accepting Orientation: : Oriented  to Self, Oriented to Place, Oriented to  Time, Oriented to Situation Alcohol / Substance Use: Not Applicable Psych Involvement: No (comment)  Admission diagnosis:  Acute on chronic respiratory failure with hypoxia (HCC) [J96.21] Acute on chronic hypoxic respiratory failure (HCC) [J96.21] Patient  Active Problem List   Diagnosis Date Noted   Acute on chronic hypoxic respiratory failure (HCC) 01/21/2023   Diarrhea 01/21/2023   Wound of left leg 05/17/2022   Medication monitoring encounter 05/17/2022   Acute on chronic diastolic CHF (congestive heart failure) (HCC) 04/22/2022   Hypokalemia 04/22/2022   Petechial rash 04/21/2022   Leukocytosis 04/21/2022   Cellulitis of left lower extremity 04/21/2022   Elevated troponin 04/21/2022   Acute on chronic heart failure with preserved ejection fraction (HFpEF) (HCC) 04/20/2022   Mild persistent asthma without complication 10/02/2019   Therapeutic drug monitoring 10/02/2019   Chronic respiratory failure with hypoxia (HCC) 03/07/2019   Physical deconditioning 02/11/2019   Obese 12/21/2018   S/P right THA, AA 12/20/2018   Pulmonary fibrosis (HCC) 10/12/2018   Chronic heart failure with preserved ejection fraction (HFpEF) (HCC) 10/12/2018   Acute on chronic respiratory failure with hypoxia (HCC) 08/29/2018   Bronchiectasis without complication (HCC) 08/29/2018   Wound infection after surgery 05/09/2018   Hyperlipidemia LDL goal <70 01/09/2017   Ankle arthritis 12/19/2016   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/2016   Hallux valgus of left foot 12/19/2016   Painful orthopaedic hardware (HCC) 12/19/2016   Pes planus 12/19/2016   Subluxation of interphalangeal joint of lesser toe of left foot 12/19/2016   Pulmonary hypertension (HCC) 10/25/2016   Coronary artery disease involving native coronary artery of native heart without angina pectoris 10/25/2016   Shortness of breath 09/12/2016   Leg edema 09/12/2016   Obstructive sleep apnea 09/12/2016   ILD (interstitial lung disease) (HCC) 09/19/2015   Thrush, oral 06/12/2014   Influenza A with pneumonia 08/02/2013   CAP (community acquired pneumonia) 07/31/2013   Hypoxia 07/31/2013   Non-healing wound of left lower extremity 11/15/2012   COPD with chronic bronchitis and emphysema (HCC)  07/21/2009   Asthmatic bronchitis, moderate persistent, uncomplicated 04/20/2009   RHINOSINUSITIS, CHRONIC 12/19/2008   ANXIETY DEPRESSION 03/25/2008   ASTHMATIC BRONCHITIS, ACUTE 05/12/2007   Seasonal and perennial allergic rhinitis 05/12/2007   REFLUX, ESOPHAGEAL 05/12/2007   Rheumatoid arthritis (HCC) 05/12/2007   PCP:  Burton Apley, MD Pharmacy:   [facility] [phone] - [place], [place] - [address] AT CORNER OF [facility] [facility] [address] [place] [address] Phone: [phone] Fax: [phone]  [facility]) - [place], [place] - [address] [facility] [address] [place] [address] Phone: [phone] Fax: [phone]  [facility] - [place], [place] - [address] [place] [address] Phone: [phone] Fax: [phone]  [facility] - [place], [place] - [address] [place]  [address] Phone: [phone] Fax: [phone]     Social Determinants of Health (SDOH) Social History: SDOH Screenings   Food Insecurity: No Food Insecurity (01/21/2023)  Housing: Low Risk  (01/21/2023)  Transportation Needs: No Transportation Needs (01/21/2023)  Utilities: Not At Risk (01/21/2023)  Depression (PHQ2-9): Low Risk  (05/17/2022)  Tobacco Use: Low Risk  (01/20/2023)   SDOH Interventions:     Readmission Risk Interventions     No data to display

## 2023-01-24 NOTE — Telephone Encounter (Signed)
Refill sent for ESBRIET to [facility] ([facility]) for Esbriet: [phone]  Dose: 801 mg three times daily  Last OV: 12/16/22 - currently hospitalized Provider: [name]  Next OV: 02/21/23  Chesley Mires, PharmD, MPH, BCPS Clinical Pharmacist (Rheumatology and Pulmonology)

## 2023-01-24 NOTE — Progress Notes (Signed)
PROGRESS NOTE                                                                                                                                                                                                             Patient Demographics:    [name], is a 77 y.o. female, DOB - Jun 26, 1946, GNF:[id number]  Outpatient Primary MD for the patient is Burton Apley, MD    LOS - 3  Admit date - 01/20/2023    Chief Complaint  Patient presents with   Shortness of Breath            Brief Narrative (HPI from H&P)   77 year old female history of pulmonary fibrosis, PAH, HFpEF, RA on chronic prednisone 5 mg daily, chronic hypoxic respiratory failure on 7 L O2 via nasal cannula at baseline, OSA on CPAP presented to the ED with acute on chronic respiratory secondary to ILD and CHF exacerbation. Pulmonary consulted.    Subjective:   Patient in bed, appears comfortable, denies any headache, no fever, no chest pain or pressure, no shortness of breath , no abdominal pain. No focal weakness.  Still has generalized weakness.   Assessment  & Plan :    #1 Acute on chronic hypoxic respiratory failure secondary to ILD/pulmonary fibrosis and HFpEF exacerbation  - Acute presentation multifactorial secondary to ILD and due to on chronic diastolic CHF EF 60% on echocardiogram done few months ago in May 2024, pulmonary critical care following the patient, she has advanced ILD at baseline and uses 7 L of oxygen at home, currently being diuresed with Lasix with good effect, continue supplemental oxygen, on IV steroids along with nebulizer treatments.  No signs of active infection, supplemental CPAP/BiPAP or NRB as needed.  She is DNR.  With IV steroids and diuretics some improvement on 01/22/2023.  2.  Diarrhea  - Norovirus infection present on admission, as needed Imodium, resolved.   3.  Leukocytosis -Likely secondary to recent steroid  use. -Afebrile continue to monitor   4.  Pulmonary hypertension -Verapamil on hold due to soft blood pressure on presentation. -Tyvaso ordered however pharmacy awaiting pulmonary input.   5.  Rheumatoid arthritis -Leflunomide. -Currently on Prednisone taper 60mg  daily for 3 days, 40mg  daily for 3 days, 20mg  daily for 3 days, 10 mg daily for 3 days then back to baseline 5mg   daily Chronically on 5 mg of prednisone.   6.  Hyperlipidemia  - Statin.   7.  Depression/anxiety  - Prozac.   8.  OSA - Noted to be on CPAP nightly however due to problem #1 and requiring BiPAP will place on BiPAP nightly.   9.  Chronic pain -Continue current home pain regiment.  10.  Generalized weakness and deconditioning.  PT OT, SNF.        Condition - Extremely Guarded  Family Communication  : Husband was updated in detail on 01/23/2023  Code Status : DNR  Consults  :  PCCM  PUD Prophylaxis :  PPI   Procedures  :            Disposition Plan  :    Status is: Inpatient  DVT Prophylaxis  :    enoxaparin (LOVENOX) injection 40 mg Start: 01/21/23 2200    Lab Results  Component Value Date   PLT 322 01/24/2023    Diet :  Diet Order             Diet Heart Room service appropriate? Yes; Fluid consistency: Thin  Diet effective now                    Inpatient Medications  Scheduled Meds:  arformoterol  15 mcg Nebulization BID   bacitracin   Topical BID   budesonide (PULMICORT) nebulizer solution  0.5 mg Nebulization BID   enoxaparin (LOVENOX) injection  40 mg Subcutaneous Q24H   FLUoxetine  20 mg Oral Daily   leflunomide  20 mg Oral Daily   loratadine  10 mg Oral Daily   montelukast  10 mg Oral QHS   oxyCODONE  10 mg Oral TID   pantoprazole  40 mg Oral Q0600   Pirfenidone  801 mg Oral TID   predniSONE  60 mg Oral Q breakfast   Followed by   Melene Muller ON 01/27/2023] predniSONE  40 mg Oral Q breakfast   Followed by   Melene Muller ON 01/30/2023] predniSONE  20 mg Oral Q breakfast    Followed by   Melene Muller ON 02/02/2023] predniSONE  10 mg Oral Q breakfast   Followed by   Melene Muller ON 02/05/2023] predniSONE  5 mg Oral Q breakfast   revefenacin  175 mcg Nebulization Daily   rosuvastatin  20 mg Oral Daily   sodium chloride flush  3 mL Intravenous Q12H   traZODone  100 mg Oral QHS   verapamil  120 mg Oral Daily   Continuous Infusions:  lactated ringers      PRN Meds:.acetaminophen **OR** acetaminophen, albuterol, loperamide, ondansetron **OR** ondansetron (ZOFRAN) IV, senna-docusate, sodium chloride  Antibiotics  :    Anti-infectives (From admission, onward)    None         Objective:   Vitals:   01/23/23 2010 01/24/23 0002 01/24/23 0306 01/24/23 0500  BP: 135/63 (!) 142/80 123/77   Pulse: 76 74 73   Resp: 19 18 20    Temp: 98.2 F (36.8 C) 98.2 F (36.8 C) 98.2 F (36.8 C)   TempSrc: Oral Oral Oral   SpO2: 96% 95% 96%   Weight:    75.1 kg  Height:        Wt Readings from Last 3 Encounters:  01/24/23 75.1 kg  12/16/22 79.4 kg  11/01/22 84.1 kg     Intake/Output Summary (Last 24 hours) at 01/24/2023 0802 Last data filed at 01/23/2023 1130 Gross per 24 hour  Intake 600 ml  Output 600 ml  Net 0 ml     Physical Exam  Awake Alert, No new F.N deficits, Normal affect [place].AT,PERRAL Supple Neck, No JVD,   Symmetrical Chest wall movement, Good air movement bilaterally, CTAB RRR,No Gallops,Rubs or new Murmurs,  +ve B.Sounds, Abd Soft, No tenderness,   No Cyanosis, Clubbing or edema       Data Review:    Recent Labs  Lab 01/20/23 2206 01/20/23 2211 01/21/23 0033 01/22/23 0513 01/23/23 0746 01/24/23 0321  WBC 14.9*  --  12.4* 9.0 6.2 8.5  HGB 10.6* 11.9* 10.5* 10.6* 11.4* 10.5*  HCT 34.7* 35.0* 34.8* 32.7* 35.6* 33.4*  PLT 344  --  314 303 333 322  MCV 93.3  --  94.1 89.8 92.0 89.8  MCH 28.5  --  28.4 29.1 29.5 28.2  MCHC 30.5  --  30.2 32.4 32.0 31.4  RDW 14.2  --  14.2 14.0 14.1 13.9  LYMPHSABS  --   --   --  0.5* 0.3* 0.4*  MONOABS   --   --   --  1.2* 0.7 1.1*  EOSABS  --   --   --  0.0 0.0 0.0  BASOSABS  --   --   --  0.0 0.0 0.0    Recent Labs  Lab 01/20/23 2206 01/20/23 2211 01/21/23 0033 01/22/23 0513 01/22/23 0616 01/22/23 1431 01/23/23 0746 01/23/23 0748 01/24/23 0321  NA 131* 128* 133* 132*  --   --  133*  --  129*  K 3.9 3.9 4.0 2.6*  --  3.8 4.0  --  3.6  CL 93*  --  95* 88*  --   --  92*  --  87*  CO2 23  --  22 31  --   --  33*  --  33*  ANIONGAP 15  --  16* 13  --   --  8  --  9  GLUCOSE 85  --  57* 114*  --   --  136*  --  127*  BUN 16  --  17 18  --   --  20  --  15  CREATININE 0.81  --  0.84 0.85  --   --  0.66  --  0.83  CRP  --   --   --   --  8.2*  --  3.9*  --  1.9*  PROCALCITON  --   --   --   --  0.21  --   --   --   --   BNP 495.9*  --   --   --  240.9*  --   --  189.0* 166.8*  MG  --   --   --   --  1.8  --  2.2  --  2.1  CALCIUM 10.2  --  9.9 9.4  --   --  9.7  --  9.5      Recent Labs  Lab 01/20/23 2206 01/21/23 0033 01/22/23 0513 01/22/23 0616 01/23/23 0746 01/23/23 0748 01/24/23 0321  CRP  --   --   --  8.2* 3.9*  --  1.9*  PROCALCITON  --   --   --  0.21  --   --   --   BNP 495.9*  --   --  240.9*  --  189.0* 166.8*  MG  --   --   --  1.8 2.2  --  2.1  CALCIUM 10.2 9.9 9.4  --  9.7  --  9.5    Radiology Reports ECHOCARDIOGRAM COMPLETE  Result Date: 01/23/2023    ECHOCARDIOGRAM REPORT   Patient Name:   CHELSI SHIPLEY Date of Exam: 01/23/2023 Medical Rec #:  [id number]      Height:       60.0 in Accession #:    [id number]     Weight:       166.0 lb Date of Birth:  Jul 27, 1945      BSA:          1.725 m Patient Age:    2 years       BP:           129/86 mmHg Patient Gender: F              HR:           89 bpm. Exam Location:  Inpatient Procedure: 2D Echo, Cardiac Doppler and Color Doppler Indications:    Pulmonary hypertension/RV failure  History:        Patient has prior history of Echocardiogram examinations.  Sonographer:    NA Referring Phys: [id number] VINEET SOOD  IMPRESSIONS  1. Left ventricular ejection fraction, by estimation, is 40 to 45%. The left ventricle has mildly decreased function. The left ventricle demonstrates global hypokinesis. Left ventricular diastolic parameters are consistent with Grade I diastolic dysfunction (impaired relaxation).  2. D-shaped septum suggesting RV pressure/volume overload. Right ventricular systolic function is severely reduced. The right ventricular size is moderately enlarged. There is moderately elevated pulmonary artery systolic pressure. The estimated right ventricular systolic pressure is 50.3 mmHg.  3. The mitral valve is normal in structure. No evidence of mitral valve regurgitation. No evidence of mitral stenosis. Moderate mitral annular calcification.  4. The aortic valve is tricuspid. There is mild calcification of the aortic valve. Aortic valve regurgitation is trivial. No aortic stenosis is present.  5. The inferior vena cava is normal in size with greater than 50% respiratory variability, suggesting right atrial pressure of 3 mmHg. FINDINGS  Left Ventricle: Left ventricular ejection fraction, by estimation, is 40 to 45%. The left ventricle has mildly decreased function. The left ventricle demonstrates global hypokinesis. The left ventricular internal cavity size was normal in size. There is  no left ventricular hypertrophy. Left ventricular diastolic parameters are consistent with Grade I diastolic dysfunction (impaired relaxation). Right Ventricle: D-shaped septum suggesting RV pressure/volume overload. The right ventricular size is moderately enlarged. No increase in right ventricular wall thickness. Right ventricular systolic function is severely reduced. There is moderately elevated pulmonary artery systolic pressure. The tricuspid regurgitant velocity is 3.44 m/s, and with an assumed right atrial pressure of 3 mmHg, the estimated right ventricular systolic pressure is 50.3 mmHg. Left Atrium: Left atrial size was normal  in size. Right Atrium: Right atrial size was normal in size. Pericardium: Trivial pericardial effusion is present. Mitral Valve: The mitral valve is normal in structure. There is mild calcification of the mitral valve leaflet(s). Moderate mitral annular calcification. No evidence of mitral valve regurgitation. No evidence of mitral valve stenosis. Tricuspid Valve: The tricuspid valve is normal in structure. Tricuspid valve regurgitation is mild. Aortic Valve: The aortic valve is tricuspid. There is mild calcification of the aortic valve. Aortic valve regurgitation is trivial. No aortic stenosis is present. Pulmonic Valve: The pulmonic valve was normal in structure. Pulmonic valve regurgitation is not visualized. Aorta: The aortic root is normal in size and structure. Venous: The inferior vena cava is normal in size with greater  than 50% respiratory variability, suggesting right atrial pressure of 3 mmHg. IAS/Shunts: No atrial level shunt detected by color flow Doppler.  LEFT VENTRICLE PLAX 2D LVIDd:         4.40 cm LVIDs:         3.60 cm LV PW:         1.00 cm LV IVS:        1.10 cm LVOT diam:     2.00 cm LV SV:         40 LV SV Index:   23 LVOT Area:     3.14 cm  RIGHT VENTRICLE RV Basal diam:  3.30 cm TAPSE (M-mode): 1.1 cm LEFT ATRIUM             Index        RIGHT ATRIUM           Index LA diam:        3.50 cm 2.03 cm/m   RA Area:     16.10 cm LA Vol (A2C):   36.4 ml 21.11 ml/m  RA Volume:   39.50 ml  22.90 ml/m LA Vol (A4C):   35.5 ml 20.59 ml/m LA Biplane Vol: 38.3 ml 22.21 ml/m  AORTIC VALVE LVOT Vmax:   75.80 cm/s LVOT Vmean:  48.200 cm/s LVOT VTI:    0.128 m  AORTA Ao Root diam: 3.00 cm Ao Asc diam:  3.50 cm TRICUSPID VALVE TR Peak grad:   47.3 mmHg TR Vmax:        344.00 cm/s  SHUNTS Systemic VTI:  0.13 m Systemic Diam: 2.00 cm Dalton McleanMD Electronically signed by Wilfred Lacy Signature Date/Time: 01/23/2023/5:45:21 PM    Final    DG Chest Port 1 View  Result Date: 01/22/2023 CLINICAL DATA:   Short of breath EXAM: PORTABLE CHEST 1 VIEW COMPARISON:  None Available. FINDINGS: Normal cardiac silhouette. LEFT basilar atelectasis similar prior. Chronic bronchitic markings. No effusion, pulmonary edema or infiltrate. No acute osseous abnormality. IMPRESSION: 1. LEFT basilar atelectasis. 2. Chronic bronchitic markings. Electronically Signed   By: Genevive Bi M.D.   On: 01/22/2023 09:10   DG Abd 1 View  Result Date: 01/21/2023 CLINICAL DATA:  Abdominal pain. EXAM: ABDOMEN - 1 VIEW COMPARISON:  None Available. FINDINGS: No bowel dilatation to suggest obstruction. Small volume of formed stool in the colon. Surgical clips in the right upper quadrant, typical of cholecystectomy. Lumbosacral fusion hardware. No definite radiopaque calculi. IMPRESSION: Normal bowel gas pattern.  No radiographic explanation for pain. Electronically Signed   By: Narda Rutherford M.D.   On: 01/21/2023 21:26   DG Chest Portable 1 View  Result Date: 01/20/2023 CLINICAL DATA:  Shortness of breath EXAM: PORTABLE CHEST 1 VIEW COMPARISON:  04/20/2022 FINDINGS: Cardiomegaly with vascular congestion. Perihilar and infrahilar opacities could reflect edema. Peripheral coarsened opacities compatible with fibrosis as seen on prior CT. No effusions. Aortic atherosclerosis. No acute bony abnormality. IMPRESSION: Cardiomegaly with vascular congestion and perihilar opacities which could reflect pulmonary edema. Chronic changes/fibrosis. Electronically Signed   By: Charlett Nose M.D.   On: 01/20/2023 22:44      Signature  -   Susa Raring M.D on 01/24/2023 at 8:02 AM   -  To page go to [email or web address]

## 2023-01-25 DIAGNOSIS — J9621 Acute and chronic respiratory failure with hypoxia: Secondary | ICD-10-CM | POA: Diagnosis not present

## 2023-01-25 LAB — CBC WITH DIFFERENTIAL/PLATELET
Abs Immature Granulocytes: 0.08 10*3/uL — ABNORMAL HIGH (ref 0.00–0.07)
Basophils Absolute: 0 10*3/uL (ref 0.0–0.1)
Basophils Relative: 0 %
Eosinophils Absolute: 0 10*3/uL (ref 0.0–0.5)
Eosinophils Relative: 0 %
HCT: 32.3 % — ABNORMAL LOW (ref 36.0–46.0)
Hemoglobin: 10 g/dL — ABNORMAL LOW (ref 12.0–15.0)
Immature Granulocytes: 1 %
Lymphocytes Relative: 4 %
Lymphs Abs: 0.3 10*3/uL — ABNORMAL LOW (ref 0.7–4.0)
MCH: 28 pg (ref 26.0–34.0)
MCHC: 31 g/dL (ref 30.0–36.0)
MCV: 90.5 fL (ref 80.0–100.0)
Monocytes Absolute: 0.7 10*3/uL (ref 0.1–1.0)
Monocytes Relative: 10 %
Neutro Abs: 6.4 10*3/uL (ref 1.7–7.7)
Neutrophils Relative %: 85 %
Platelets: 285 10*3/uL (ref 150–400)
RBC: 3.57 MIL/uL — ABNORMAL LOW (ref 3.87–5.11)
RDW: 14 % (ref 11.5–15.5)
WBC: 7.5 10*3/uL (ref 4.0–10.5)
nRBC: 0 % (ref 0.0–0.2)

## 2023-01-25 LAB — UREA NITROGEN, URINE: Urea Nitrogen, Ur: 299 mg/dL

## 2023-01-25 LAB — BASIC METABOLIC PANEL
Anion gap: 11 (ref 5–15)
BUN: 14 mg/dL (ref 8–23)
CO2: 30 mmol/L (ref 22–32)
Calcium: 9 mg/dL (ref 8.9–10.3)
Chloride: 92 mmol/L — ABNORMAL LOW (ref 98–111)
Creatinine, Ser: 0.69 mg/dL (ref 0.44–1.00)
GFR, Estimated: >60 mL/min (ref >60)
Glucose, Bld: 114 mg/dL — ABNORMAL HIGH (ref 70–99)
Potassium: 2.9 mmol/L — ABNORMAL LOW (ref 3.5–5.1)
Sodium: 133 mmol/L — ABNORMAL LOW (ref 135–145)

## 2023-01-25 LAB — BRAIN NATRIURETIC PEPTIDE: B Natriuretic Peptide: 229.8 pg/mL — ABNORMAL HIGH (ref 0.0–100.0)

## 2023-01-25 LAB — C-REACTIVE PROTEIN: CRP: 0.9 mg/dL (ref <1.0)

## 2023-01-25 LAB — MAGNESIUM: Magnesium: 2 mg/dL (ref 1.7–2.4)

## 2023-01-25 MED ORDER — POTASSIUM CHLORIDE CRYS ER 20 MEQ PO TBCR
20.0000 meq | EXTENDED_RELEASE_TABLET | Freq: Once | ORAL | Status: AC
  Administered 2023-01-25: 20 meq via ORAL
  Filled 2023-01-25: qty 1

## 2023-01-25 MED ORDER — POTASSIUM CHLORIDE CRYS ER 20 MEQ PO TBCR
40.0000 meq | EXTENDED_RELEASE_TABLET | Freq: Once | ORAL | Status: AC
  Administered 2023-01-25: 40 meq via ORAL
  Filled 2023-01-25: qty 2

## 2023-01-25 MED ORDER — OXYCODONE HCL 10 MG PO TABS: 10.0000 mg | ORAL_TABLET | Freq: Three times a day (TID) | ORAL | 0 refills | Status: DC

## 2023-01-25 MED ORDER — PREDNISONE 5 MG PO TABS: ORAL_TABLET | ORAL | Status: DC

## 2023-01-25 MED ORDER — POTASSIUM CHLORIDE 10 MEQ/100ML IV SOLN
10.0000 meq | INTRAVENOUS | Status: AC
  Administered 2023-01-25 (×2): 10 meq via INTRAVENOUS
  Filled 2023-01-25 (×2): qty 100

## 2023-01-25 NOTE — Discharge Summary (Signed)
[name] QQV:[id number] DOB: [birth year] DOA: 01/20/2023  PCP: Burton Apley, MD  Admit date: 01/20/2023  Discharge date: 01/25/2023  Admitted From: Home   Disposition:  SNF   Recommendations for Outpatient Follow-up:   Follow up with PCP in 1-2 weeks  PCP Please obtain BMP/CBC, 2 view CXR in 1week,  (see Discharge instructions)   PCP Please follow up on the following pending results:    Home Health: None   Equipment/Devices: None  Consultations: PCCM Discharge Condition: Stable    CODE STATUS: Full    Diet Recommendation: Heart Healthy 1.5 L fluid restriction per day    Chief Complaint  Patient presents with   Shortness of Breath          Brief history of present illness from the day of admission and additional interim summary    77 year old female history of pulmonary fibrosis, PAH, HFpEF, RA on chronic prednisone 5 mg daily, chronic hypoxic respiratory failure on 7 L O2 via nasal cannula at baseline, OSA on CPAP presented to the ED with acute on chronic respiratory secondary to ILD and CHF exacerbation. Pulmonary consulted.                                                                  Hospital Course   #1 Acute on chronic hypoxic respiratory failure secondary to ILD/pulmonary fibrosis and HFpEF exacerbation  - Acute presentation multifactorial secondary to ILD and due to on chronic diastolic CHF EF 60% on echocardiogram done few months ago in May 2024, pulmonary critical care following the patient, she has advanced ILD at baseline and uses 7 L of oxygen at home, currently being diuresed with Lasix with good effect, continue supplemental oxygen, on IV steroids along with nebulizer treatments.  No signs of active infection, supplemental CPAP/BiPAP or NRB as needed.  She is DNR.  With IV steroids and  diuretics her pulmonary status is improved and she is back to her baseline of 7 L oxygen, continue home regimen unchanged, encouraged to sit in chair use I-S and flutter valve for pulmonary toiletry at SNF, follow-up with pulmonologist [name] in 1 week.   Currently on Prednisone taper 60mg  daily for 3 days, 40mg  daily for 3 days, 20mg  daily for 3 days, 10 mg daily for 3 days then back to baseline 5mg  daily Chronically on 5 mg of prednisone - non stop.   2.  Diarrhea  - Norovirus infection present on admission, as needed Imodium, resolved.   3.  Leukocytosis Due to recent steroid use.  No signs of infection.   4.  Pulmonary hypertension -Verapamil on hold due to soft blood pressure on presentation. -Tyvaso ordered however pharmacy awaiting pulmonary input.   5.  Rheumatoid arthritis -Leflunomide.  On as above.    6.  Hyperlipidemia  -  Statin.   7.  Depression/anxiety  - Prozac.   8.  OSA - Noted to be on CPAP nightly however due to problem #1 and requiring BiPAP will place on BiPAP nightly.   9.  Chronic pain -Continue current home pain regiment.   10.  Generalized weakness and deconditioning.  PT OT, SNF.  11.  Hypokalemia .  Replaced.    Discharge diagnosis     Principal Problem:   Acute on chronic hypoxic respiratory failure (HCC) Active Problems:   Acute on chronic heart failure with preserved ejection fraction (HFpEF) (HCC)   ILD (interstitial lung disease) (HCC)   Pulmonary hypertension (HCC)   Hyperlipidemia LDL goal <70   ANXIETY DEPRESSION   Rheumatoid arthritis (HCC)   Diarrhea    Discharge instructions    Discharge Instructions     Discharge instructions   Complete by: As directed    Follow with Primary MD Burton Apley, MD in 7 days   Get CBC, CMP, 2 view Chest X ray -  checked next visit with your primary MD or SNF MD    Activity: As tolerated with Full fall precautions use walker/cane & assistance as needed  Disposition SNF  Diet:  Heart Healthy with 1.5 L fluid restriction per day  Special Instructions: If you have smoked or chewed Tobacco  in the last 2 yrs please stop smoking, stop any regular Alcohol  and or any Recreational drug use.  On your next visit with your primary care physician please Get Medicines reviewed and adjusted.  Please request your Prim.MD to go over all Hospital Tests and Procedure/Radiological results at the follow up, please get all Hospital records sent to your Prim MD by signing hospital release before you go home.  If you experience worsening of your admission symptoms, develop shortness of breath, life threatening emergency, suicidal or homicidal thoughts you must seek medical attention immediately by calling 911 or calling your MD immediately  if symptoms less severe.  You Must read complete instructions/literature along with all the possible adverse reactions/side effects for all the Medicines you take and that have been prescribed to you. Take any new Medicines after you have completely understood and accpet all the possible adverse reactions/side effects.   Increase activity slowly   Complete by: As directed    No wound care   Complete by: As directed        Discharge Medications   Allergies as of 01/25/2023       Reactions   Cefdinir Diarrhea   Erythromycin Base Diarrhea   Lactose Diarrhea   Lactulose Diarrhea   Mesalamine Nausea Only   Methotrexate Other (See Comments)   Felt sick   Nitrofuran Derivatives    shakiness   Other Diarrhea, Other (See Comments)   Shaking uncontrollably "lettuce only"   Sulfa Antibiotics Other (See Comments)   Patient can't recall reaction, but made her feel "really sick"   Tdap [tetanus-diphth-acell Pertussis]    Shaking uncontrollably   Tetanus Toxoid, Adsorbed Other (See Comments)   Shaking uncontrollable         Medication List     STOP taking these medications    ACTEMRA IV       TAKE these medications    albuterol 108  (90 Base) MCG/ACT inhaler Commonly known as: VENTOLIN HFA USE 2 INHALATIONS BY MOUTH EVERY 6 HOURS AS NEEDED What changed: See the new instructions.   aspirin EC 81 MG tablet Take 81 mg by mouth daily.   benzonatate  200 MG capsule Commonly known as: TESSALON TAKE 1 CAPSULE (200 MG TOTAL) BY MOUTH 3 (THREE) TIMES DAILY AS NEEDED FOR COUGH. What changed: when to take this   Biofreeze Cool The Pain 4 % Gel Generic drug: Menthol (Topical Analgesic) Apply 1 Application topically 2 (two) times daily as needed (pain).   cetirizine 10 MG tablet Commonly known as: ZYRTEC Take 20 mg by mouth daily.   clidinium-chlordiazePOXIDE 5-2.5 MG capsule Commonly known as: LIBRAX Take 1 capsule by mouth 3 (three) times daily as needed (for IBS).   colestipol 1 g tablet Commonly known as: COLESTID Take 1 g by mouth daily.   dicyclomine 20 MG tablet Commonly known as: BENTYL Take 20 mg by mouth 2 (two) times daily as needed (IBS).   Esbriet 801 MG Tabs Generic drug: Pirfenidone Take 1 tablet (801 mg total) by mouth 3 (three) times daily.   FLUoxetine 20 MG capsule Commonly known as: PROZAC Take 20 mg by mouth daily.   fluticasone 50 MCG/ACT nasal spray Commonly known as: FLONASE SPRAY 2 SPRAYS INTO EACH NOSTRIL EVERY DAY What changed: See the new instructions.   furosemide 80 MG tablet Commonly known as: LASIX Take 1 tablet (80 mg total) by mouth daily. What changed: how much to take   ipratropium 0.03 % nasal spray Commonly known as: ATROVENT USE 1 TO 2 SPRAYS IN BOTH  NOSTRILS TWICE DAILY   ipratropium-albuterol 0.5-2.5 (3) MG/3ML Soln Commonly known as: DUONEB USE 1 VIAL IN NEBULIZER EVERY 6 HOURS AS NEEDED J45.901 What changed: See the new instructions.   leflunomide 20 MG tablet Commonly known as: ARAVA Take 1 tablet (20 mg total) by mouth daily.   loperamide 2 MG capsule Commonly known as: IMODIUM Take 1 capsule (2 mg total) by mouth 2 (two) times daily as needed for  diarrhea or loose stools.   methocarbamol 750 MG tablet Commonly known as: ROBAXIN Take 750 mg by mouth at bedtime. for muscle spasms   montelukast 10 MG tablet Commonly known as: SINGULAIR TAKE 1 TABLET BY MOUTH AT  BEDTIME   omeprazole 40 MG capsule Commonly known as: PRILOSEC Take 40 mg by mouth at bedtime.   Oxycodone HCl 10 MG Tabs Take 1 tablet (10 mg total) by mouth 3 (three) times daily.   OXYGEN Inhale 7 L into the lungs continuous.   potassium chloride SA 20 MEQ tablet Commonly known as: KLOR-CON M TAKE 1 TABLET BY MOUTH TWICE  DAILY   predniSONE 5 MG tablet Commonly known as: DELTASONE Take 60 mg pill for the next 3 days followed by 40 mg pill for the next 3 days followed by 20 mg pills for the next 3 days followed by 10 mg pill for the next 3 days thereafter take 5 mg pills daily nonstop as your chronic regimen. What changed:  how much to take how to take this when to take this additional instructions   rosuvastatin 20 MG tablet Commonly known as: CRESTOR TAKE 1 TABLET BY MOUTH DAILY   Systane 0.4-0.3 % Gel ophthalmic gel Generic drug: Polyethyl Glycol-Propyl Glycol Place 2 application  into both eyes 2 (two) times daily as needed (dry eyes).   traZODone 100 MG tablet Commonly known as: DESYREL Take 100 mg by mouth at bedtime.   Trelegy Ellipta 100-62.5-25 MCG/ACT Aepb Generic drug: Fluticasone-Umeclidin-Vilant USE 1 INHALATION BY MOUTH ONCE  DAILY AT THE SAME TIME EACH DAY What changed: See the new instructions.   Tyvaso DPI Maintenance Kit 64 MCG Powd Generic  drug: Treprostinil Inhale 1 puff into the lungs in the morning, at noon, in the evening, and at bedtime.   verapamil 120 MG CR tablet Commonly known as: CALAN-SR Take 1 tablet (120 mg total) by mouth daily.   Vitamin D3 250 MCG (10000 UT) capsule Take 10,000 Units by mouth daily.         Follow-up Information     Burton Apley, MD. Schedule an appointment as soon as possible for  a visit in 1 week(s).   Specialty: Internal Medicine Contact information: [address] [place] [address] [phone]         Kalman Shan, MD. Schedule an appointment as soon as possible for a visit in 1 week(s).   Specialty: Pulmonary Disease Contact information: [address] [place] [address] [phone]                 Major procedures and Radiology Reports - PLEASE review detailed and final reports thoroughly  -      ECHOCARDIOGRAM COMPLETE  Result Date: 01/23/2023    ECHOCARDIOGRAM REPORT   Patient Name:   CARMELINE KOWAL Date of Exam: 01/23/2023 Medical Rec #:  [id number]      Height:       60.0 in Accession #:    [id number]     Weight:       166.0 lb Date of Birth:  [birth year]      BSA:          1.725 m Patient Age:    76 years       BP:           129/86 mmHg Patient Gender: F              HR:           89 bpm. Exam Location:  Inpatient Procedure: 2D Echo, Cardiac Doppler and Color Doppler Indications:    Pulmonary hypertension/RV failure  History:        Patient has prior history of Echocardiogram examinations.  Sonographer:    NA Referring Phys: [id number] VINEET SOOD IMPRESSIONS  1. Left ventricular ejection fraction, by estimation, is 40 to 45%. The left ventricle has mildly decreased function. The left ventricle demonstrates global hypokinesis. Left ventricular diastolic parameters are consistent with Grade I diastolic dysfunction (impaired relaxation).  2. D-shaped septum suggesting RV pressure/volume overload. Right ventricular systolic function is severely reduced. The right ventricular size is moderately enlarged. There is moderately elevated pulmonary artery systolic pressure. The estimated right ventricular systolic pressure is 50.3 mmHg.  3. The mitral valve is normal in structure. No evidence of mitral valve regurgitation. No evidence of mitral stenosis. Moderate mitral annular calcification.  4. The aortic valve is tricuspid. There is mild  calcification of the aortic valve. Aortic valve regurgitation is trivial. No aortic stenosis is present.  5. The inferior vena cava is normal in size with greater than 50% respiratory variability, suggesting right atrial pressure of 3 mmHg. FINDINGS  Left Ventricle: Left ventricular ejection fraction, by estimation, is 40 to 45%. The left ventricle has mildly decreased function. The left ventricle demonstrates global hypokinesis. The left ventricular internal cavity size was normal in size. There is  no left ventricular hypertrophy. Left ventricular diastolic parameters are consistent with Grade I diastolic dysfunction (impaired relaxation). Right Ventricle: D-shaped septum suggesting RV pressure/volume overload. The right ventricular size is moderately enlarged. No increase in right ventricular wall thickness. Right ventricular systolic function is severely reduced. There  is moderately elevated pulmonary artery systolic pressure. The tricuspid regurgitant velocity is 3.44 m/s, and with an assumed right atrial pressure of 3 mmHg, the estimated right ventricular systolic pressure is 50.3 mmHg. Left Atrium: Left atrial size was normal in size. Right Atrium: Right atrial size was normal in size. Pericardium: Trivial pericardial effusion is present. Mitral Valve: The mitral valve is normal in structure. There is mild calcification of the mitral valve leaflet(s). Moderate mitral annular calcification. No evidence of mitral valve regurgitation. No evidence of mitral valve stenosis. Tricuspid Valve: The tricuspid valve is normal in structure. Tricuspid valve regurgitation is mild. Aortic Valve: The aortic valve is tricuspid. There is mild calcification of the aortic valve. Aortic valve regurgitation is trivial. No aortic stenosis is present. Pulmonic Valve: The pulmonic valve was normal in structure. Pulmonic valve regurgitation is not visualized. Aorta: The aortic root is normal in size and structure. Venous: The inferior  vena cava is normal in size with greater than 50% respiratory variability, suggesting right atrial pressure of 3 mmHg. IAS/Shunts: No atrial level shunt detected by color flow Doppler.  LEFT VENTRICLE PLAX 2D LVIDd:         4.40 cm LVIDs:         3.60 cm LV PW:         1.00 cm LV IVS:        1.10 cm LVOT diam:     2.00 cm LV SV:         40 LV SV Index:   23 LVOT Area:     3.14 cm  RIGHT VENTRICLE RV Basal diam:  3.30 cm TAPSE (M-mode): 1.1 cm LEFT ATRIUM             Index        RIGHT ATRIUM           Index LA diam:        3.50 cm 2.03 cm/m   RA Area:     16.10 cm LA Vol (A2C):   36.4 ml 21.11 ml/m  RA Volume:   39.50 ml  22.90 ml/m LA Vol (A4C):   35.5 ml 20.59 ml/m LA Biplane Vol: 38.3 ml 22.21 ml/m  AORTIC VALVE LVOT Vmax:   75.80 cm/s LVOT Vmean:  48.200 cm/s LVOT VTI:    0.128 m  AORTA Ao Root diam: 3.00 cm Ao Asc diam:  3.50 cm TRICUSPID VALVE TR Peak grad:   47.3 mmHg TR Vmax:        344.00 cm/s  SHUNTS Systemic VTI:  0.13 m Systemic Diam: 2.00 cm Dalton McleanMD Electronically signed by Wilfred Lacy Signature Date/Time: 01/23/2023/5:45:21 PM    Final    DG Chest Port 1 View  Result Date: 01/22/2023 CLINICAL DATA:  Short of breath EXAM: PORTABLE CHEST 1 VIEW COMPARISON:  None Available. FINDINGS: Normal cardiac silhouette. LEFT basilar atelectasis similar prior. Chronic bronchitic markings. No effusion, pulmonary edema or infiltrate. No acute osseous abnormality. IMPRESSION: 1. LEFT basilar atelectasis. 2. Chronic bronchitic markings. Electronically Signed   By: Genevive Bi M.D.   On: 01/22/2023 09:10   DG Abd 1 View  Result Date: 01/21/2023 CLINICAL DATA:  Abdominal pain. EXAM: ABDOMEN - 1 VIEW COMPARISON:  None Available. FINDINGS: No bowel dilatation to suggest obstruction. Small volume of formed stool in the colon. Surgical clips in the right upper quadrant, typical of cholecystectomy. Lumbosacral fusion hardware. No definite radiopaque calculi. IMPRESSION: Normal bowel gas pattern.  No  radiographic explanation for pain. Electronically Signed  By: Narda Rutherford M.D.   On: 01/21/2023 21:26   DG Chest Portable 1 View  Result Date: 01/20/2023 CLINICAL DATA:  Shortness of breath EXAM: PORTABLE CHEST 1 VIEW COMPARISON:  04/20/2022 FINDINGS: Cardiomegaly with vascular congestion. Perihilar and infrahilar opacities could reflect edema. Peripheral coarsened opacities compatible with fibrosis as seen on prior CT. No effusions. Aortic atherosclerosis. No acute bony abnormality. IMPRESSION: Cardiomegaly with vascular congestion and perihilar opacities which could reflect pulmonary edema. Chronic changes/fibrosis. Electronically Signed   By: Charlett Nose M.D.   On: 01/20/2023 22:44    Micro Results    Recent Results (from the past 240 hour(s))  SARS Coronavirus 2 by RT PCR (hospital order, performed in [facility] hospital lab) *cepheid single result test* Anterior Nasal Swab     Status: None   Collection Time: 01/20/23 10:06 PM   Specimen: Anterior Nasal Swab  Result Value Ref Range Status   SARS Coronavirus 2 by RT PCR NEGATIVE NEGATIVE Final    Comment: Performed at [facility], [address] [place], [place] [address]  C Difficile Quick Screen w PCR reflex     Status: None   Collection Time: 01/21/23 11:20 AM   Specimen: Stool  Result Value Ref Range Status   C Diff antigen NEGATIVE NEGATIVE Final   C Diff toxin NEGATIVE NEGATIVE Final   C Diff interpretation No C. difficile detected.  Final    Comment: Performed at [name], [address], [place], [place] [address]  Gastrointestinal Panel by PCR , Stool     Status: Abnormal   Collection Time: 01/21/23  1:36 PM   Specimen: Stool  Result Value Ref Range Status   Campylobacter species NOT DETECTED NOT DETECTED Final   Plesimonas shigelloides NOT DETECTED NOT DETECTED Final   Salmonella species NOT DETECTED NOT DETECTED Final   Yersinia enterocolitica NOT DETECTED NOT DETECTED Final   Vibrio species NOT  DETECTED NOT DETECTED Final   Vibrio cholerae NOT DETECTED NOT DETECTED Final   Enteroaggregative E coli (EAEC) NOT DETECTED NOT DETECTED Final   Enteropathogenic E coli (EPEC) NOT DETECTED NOT DETECTED Final   Enterotoxigenic E coli (ETEC) NOT DETECTED NOT DETECTED Final   Shiga like toxin producing E coli (STEC) NOT DETECTED NOT DETECTED Final   Shigella/Enteroinvasive E coli (EIEC) NOT DETECTED NOT DETECTED Final   Cryptosporidium NOT DETECTED NOT DETECTED Final   Cyclospora cayetanensis NOT DETECTED NOT DETECTED Final   Entamoeba histolytica NOT DETECTED NOT DETECTED Final   Giardia lamblia NOT DETECTED NOT DETECTED Final   Adenovirus F40/41 NOT DETECTED NOT DETECTED Final   Astrovirus NOT DETECTED NOT DETECTED Final   Norovirus GI/GII DETECTED (A) NOT DETECTED Final    Comment: CRITICAL RESULT CALLED TO, READ BACK BY AND VERIFIED WITH: MILLS,AMY RN @ 01/22/23 @ 0203 LFD    Rotavirus A NOT DETECTED NOT DETECTED Final   Sapovirus (I, II, IV, and V) NOT DETECTED NOT DETECTED Final    Comment: Performed at [facility], [address] [place], [place] [address]  Urine Culture (for pregnant, neutropenic or urologic patients or patients with an indwelling urinary catheter)     Status: Abnormal   Collection Time: 01/21/23  7:03 PM   Specimen: Urine, Catheterized  Result Value Ref Range Status   Specimen Description URINE, CATHETERIZED  Final   Special Requests   Final    NONE Performed at [facility], [address] [place], [place] [address]  Culture (A)  Final    20,000 COLONIES/mL KLEBSIELLA PNEUMONIAE Confirmed Extended Spectrum Beta-Lactamase Producer (ESBL).  In bloodstream infections from ESBL organisms, carbapenems are preferred over piperacillin/tazobactam. They are shown to have a lower risk of mortality.    Report Status 01/23/2023 FINAL  Final   Organism ID, Bacteria KLEBSIELLA PNEUMONIAE (A)  Final      Susceptibility   Klebsiella pneumoniae - MIC*     AMPICILLIN >=32 RESISTANT Resistant     CEFAZOLIN >=64 RESISTANT Resistant     CEFEPIME >=32 RESISTANT Resistant     CEFTRIAXONE >=64 RESISTANT Resistant     CIPROFLOXACIN 2 RESISTANT Resistant     GENTAMICIN >=16 RESISTANT Resistant     IMIPENEM <=0.25 SENSITIVE Sensitive     NITROFURANTOIN 128 RESISTANT Resistant     TRIMETH/SULFA >=320 RESISTANT Resistant     AMPICILLIN/SULBACTAM >=32 RESISTANT Resistant     PIP/TAZO 32 INTERMEDIATE Intermediate     * 20,000 COLONIES/mL KLEBSIELLA PNEUMONIAE  MRSA Next Gen by PCR, Nasal     Status: Abnormal   Collection Time: 01/22/23  5:48 AM   Specimen: Nasal Mucosa; Nasal Swab  Result Value Ref Range Status   MRSA by PCR Next Gen DETECTED (A) NOT DETECTED Final    Comment: (NOTE) The GeneXpert MRSA Assay (FDA approved for NASAL specimens only), is one component of a comprehensive MRSA colonization surveillance program. It is not intended to diagnose MRSA infection nor to guide or monitor treatment for MRSA infections. Test performance is not FDA approved in patients less than 27 years old. Performed at [facility], [address] [place], [place] [address]   Respiratory (~20 pathogens) panel by PCR     Status: Abnormal   Collection Time: 01/22/23  5:48 AM   Specimen: Nasopharyngeal Swab; Respiratory  Result Value Ref Range Status   Adenovirus NOT DETECTED NOT DETECTED Final   Coronavirus 229E NOT DETECTED NOT DETECTED Final    Comment: (NOTE) The Coronavirus on the Respiratory Panel, DOES NOT test for the novel  Coronavirus (2019 nCoV)    Coronavirus HKU1 DETECTED (A) NOT DETECTED Final   Coronavirus NL63 NOT DETECTED NOT DETECTED Final   Coronavirus OC43 NOT DETECTED NOT DETECTED Final   Metapneumovirus NOT DETECTED NOT DETECTED Final   Rhinovirus / Enterovirus NOT DETECTED NOT DETECTED Final   Influenza A NOT DETECTED NOT DETECTED Final   Influenza B NOT DETECTED NOT DETECTED Final   Parainfluenza Virus 1 NOT DETECTED  NOT DETECTED Final   Parainfluenza Virus 2 NOT DETECTED NOT DETECTED Final   Parainfluenza Virus 3 NOT DETECTED NOT DETECTED Final   Parainfluenza Virus 4 NOT DETECTED NOT DETECTED Final   Respiratory Syncytial Virus NOT DETECTED NOT DETECTED Final   Bordetella pertussis NOT DETECTED NOT DETECTED Final   Bordetella Parapertussis NOT DETECTED NOT DETECTED Final   Chlamydophila pneumoniae NOT DETECTED NOT DETECTED Final   Mycoplasma pneumoniae NOT DETECTED NOT DETECTED Final    Comment: Performed at [facility], [address] [place], [place] [address]    Today   Subjective    [name] today has no headache,no chest abdominal pain,no new weakness tingling or numbness, feels much better wants to go home today.    Objective   Blood pressure 124/70, pulse 66, temperature 98.2 F (36.8 C), temperature source Oral, resp. rate 11, height 5' (1.524 m), weight 75.1 kg, SpO2 99 %.   Intake/Output Summary (Last 24 hours) at 01/25/2023 0819 Last data filed at 01/25/2023  0400 Gross per 24 hour  Intake 300 ml  Output --  Net 300 ml    Exam  Awake Alert, No new F.N deficits,    .AT,PERRAL Supple Neck,   Symmetrical Chest wall movement, Good air movement bilaterally, fine basilar rales RRR,No Gallops,   +ve B.Sounds, Abd Soft, Non tender,  No Cyanosis, Clubbing or edema    Data Review   Recent Labs  Lab 01/21/23 0033 01/22/23 0513 01/23/23 0746 01/24/23 0321 01/25/23 0157  WBC 12.4* 9.0 6.2 8.5 7.5  HGB 10.5* 10.6* 11.4* 10.5* 10.0*  HCT 34.8* 32.7* 35.6* 33.4* 32.3*  PLT 314 303 333 322 285  MCV 94.1 89.8 92.0 89.8 90.5  MCH 28.4 29.1 29.5 28.2 28.0  MCHC 30.2 32.4 32.0 31.4 31.0  RDW 14.2 14.0 14.1 13.9 14.0  LYMPHSABS  --  0.5* 0.3* 0.4* 0.3*  MONOABS  --  1.2* 0.7 1.1* 0.7  EOSABS  --  0.0 0.0 0.0 0.0  BASOSABS  --  0.0 0.0 0.0 0.0    Recent Labs  Lab 01/20/23 2206 01/20/23 2211 01/21/23 0033 01/22/23 0513 01/22/23 0616 01/22/23 1431  01/23/23 0746 01/23/23 0748 01/24/23 0321 01/25/23 0157  NA 131*   < > 133* 132*  --   --  133*  --  129* 133*  K 3.9   < > 4.0 2.6*  --  3.8 4.0  --  3.6 2.9*  CL 93*  --  95* 88*  --   --  92*  --  87* 92*  CO2 23  --  22 31  --   --  33*  --  33* 30  ANIONGAP 15  --  16* 13  --   --  8  --  9 11  GLUCOSE 85  --  57* 114*  --   --  136*  --  127* 114*  BUN 16  --  17 18  --   --  20  --  15 14  CREATININE 0.81  --  0.84 0.85  --   --  0.66  --  0.83 0.69  CRP  --   --   --   --  8.2*  --  3.9*  --  1.9* 0.9  PROCALCITON  --   --   --   --  0.21  --   --   --   --   --   BNP 495.9*  --   --   --  240.9*  --   --  189.0* 166.8* 229.8*  MG  --   --   --   --  1.8  --  2.2  --  2.1 2.0  CALCIUM 10.2  --  9.9 9.4  --   --  9.7  --  9.5 9.0   < > = values in this interval not displayed.    Total Time in preparing paper work, data evaluation and todays exam - 35 minutes  Signature  -    Susa Raring M.D on 01/25/2023 at 8:19 AM   -  To page go to [email or web address]

## 2023-01-25 NOTE — Progress Notes (Signed)
PRN CPAP not indicated @this  time

## 2023-01-25 NOTE — Care Management Important Message (Signed)
Important Message  Patient Details  Name: [name] MRN: [id number] Date of Birth: June 04, 1946   Medicare Important Message Given:  Yes     Sherilyn Banker 01/25/2023, 11:33 AM

## 2023-01-25 NOTE — Progress Notes (Signed)
1509: call placed to [facility], no answer. Will try again

## 2023-01-25 NOTE — Progress Notes (Signed)
Occupational Therapy Treatment Patient Details Name: [name] MRN: [id number] DOB: 08/05/1945 Today's Date: 01/25/2023   History of present illness Patient is a 77 yo female presenting on 01/20/23 with SOB. PMH includes: Crohn's, HTN, HFpEF, asthmatic bronchitis, PAH, ILD, chronic respiratory failure on 7 L of oxygen, rheumatoid arthritis on chronic immunosuppression, and OSA   OT comments  Patient demonstrating good gains this treatment session with min assist and verbal cues to get to EOB and patient able to perform grooming tasks seated on EOB. Patient declined getting OOB but performed sit to stands from EOB and side stepping towards [facility] with RW and min assist. Patient was mod assist to return to supine. Patient will benefit from continued inpatient follow up therapy, <3 hours/day to address bathing, dressing, and functional transfers. Acute OT to continue to follow.    Recommendations for follow up therapy are one component of a multi-disciplinary discharge planning process, led by the attending physician.  Recommendations may be updated based on patient status, additional functional criteria and insurance authorization.    Assistance Recommended at Discharge Frequent or constant Supervision/Assistance  Patient can return home with the following  A lot of help with bathing/dressing/bathroom;Assist for transportation;Two people to help with walking and/or transfers   Equipment Recommendations  Hospital bed    Recommendations for Other Services      Precautions / Restrictions Precautions Precautions: Fall Precaution Comments: Fear of falls; Enteric precs Restrictions Weight Bearing Restrictions: No       Mobility Bed Mobility Overal bed mobility: Needs Assistance Bed Mobility: Rolling, Sit to Sidelying, Supine to Sit Rolling: Min assist   Supine to sit: Min assist, HOB elevated   Sit to sidelying: Mod assist General bed mobility comments: requried assistance with trunk  to get to EOB and assistance with BLE with cues for rail use and technique    Transfers Overall transfer level: Needs assistance Equipment used: Rolling walker (2 wheels) Transfers: Sit to/from Stand Sit to Stand: Min assist, From elevated surface           General transfer comment: side stepping performed from EOB to get to [facility]     Balance Overall balance assessment: Needs assistance Sitting-balance support: Single extremity supported, Feet supported, No upper extremity supported Sitting balance-Leahy Scale: Fair Sitting balance - Comments: able to perform grooming tasks seated on EOB with no UE support   Standing balance support: Bilateral upper extremity supported, During functional activity Standing balance-Leahy Scale: Poor Standing balance comment: reliant on RW for support                           ADL either performed or assessed with clinical judgement   ADL Overall ADL's : Needs assistance/impaired     Grooming: Wash/dry hands;Wash/dry face;Oral care;Brushing hair;Supervision/safety;Sitting Grooming Details (indicate cue type and reason): on EOB             Lower Body Dressing: Maximal assistance;Bed level Lower Body Dressing Details (indicate cue type and reason): to donn socks                    Extremity/Trunk Assessment              Vision       Perception     Praxis      Cognition Arousal/Alertness: Awake/alert Behavior During Therapy: WFL for tasks assessed/performed Overall Cognitive Status: Within Functional Limits for tasks assessed  Exercises      Shoulder Instructions       General Comments SpO2 92 on 6 liters of O2 and desating to 85 with standing    Pertinent Vitals/ Pain       Pain Assessment Pain Assessment: Faces Faces Pain Scale: Hurts a little bit Pain Location: abdomen on left side Pain Descriptors / Indicators: Grimacing,  Discomfort Pain Intervention(s): Monitored during session, Repositioned  Home Living                                          Prior Functioning/Environment              Frequency  Min 1X/week        Progress Toward Goals  OT Goals(current goals can now be found in the care plan section)  Progress towards OT goals: Progressing toward goals  Acute Rehab OT Goals Patient Stated Goal: get stronger OT Goal Formulation: With patient Time For Goal Achievement: 02/06/23 Potential to Achieve Goals: Fair ADL Goals Pt Will Perform Grooming: with set-up;sitting Pt Will Perform Upper Body Bathing: with set-up;sitting Pt Will Perform Lower Body Bathing: with min assist;bed level;sitting/lateral leans Pt Will Perform Upper Body Dressing: with set-up;sitting Pt Will Perform Lower Body Dressing: with mod assist;sitting/lateral leans;bed level Pt Will Transfer to Toilet: with mod assist;bedside commode;squat pivot transfer;stand pivot transfer Pt Will Perform Toileting - Clothing Manipulation and hygiene: with mod assist;sitting/lateral leans;with caregiver independent in assisting  Plan Discharge plan remains appropriate    Co-evaluation                 AM-PAC OT "6 Clicks" Daily Activity     Outcome Measure   Help from another person eating meals?: A Little Help from another person taking care of personal grooming?: A Little Help from another person toileting, which includes using toliet, bedpan, or urinal?: A Lot Help from another person bathing (including washing, rinsing, drying)?: A Lot Help from another person to put on and taking off regular upper body clothing?: A Little Help from another person to put on and taking off regular lower body clothing?: Total 6 Click Score: 14    End of Session Equipment Utilized During Treatment: Oxygen;Rolling walker (2 wheels)  OT Visit Diagnosis: Unsteadiness on feet (R26.81);Other abnormalities of gait and  mobility (R26.89);Muscle weakness (generalized) (M62.81)   Activity Tolerance Patient tolerated treatment well   Patient Left in bed;with call bell/phone within reach;with bed alarm set   Nurse Communication Mobility status        Time: 1308-6578 OT Time Calculation (min): 31 min  Charges: OT General Charges $OT Visit: 1 Visit OT Treatments $Self Care/Home Management : 8-22 mins $Therapeutic Activity: 8-22 mins  Alfonse Flavors, OTA Acute Rehabilitation Services  Office [phone]   Dewain Penning 01/25/2023, 11:38 AM

## 2023-01-25 NOTE — Progress Notes (Signed)
Only 20k colonies of ESBL, ok to stop merrem per Dr. Thedore Mins.  Ulyses Southward, PharmD, BCIDP, AAHIVP, CPP Infectious Disease Pharmacist 01/25/2023 8:08 AM

## 2023-01-25 NOTE — TOC Transition Note (Signed)
Transition of Care [facility]) - CM/SW Discharge Note   Patient Details  Name: [name] MRN: [id number] Date of Birth: May 04, 1946  [facility] CM/SW Contact:  Ralene Bathe, LCSW Phone Number: 01/25/2023, 11:05 AM   Clinical Narrative:    Patient will DC to: [name] Anticipated DC date:  01/25/2023 Family notified: Yes Transport by: Sharin Mons   Per MD patient ready for DC to SNF. RN to call report prior to discharge [phone] room 511). RN, patient's family, and facility notified of DC. Discharge Summary sent to facility. DC packet on chart. Ambulance transport will be requested for patient.   CSW will sign off for now as social work intervention is no longer needed. Please consult [place] again if new needs arise.    Final next level of care: Skilled Nursing Facility Barriers to Discharge: Barriers Resolved   Patient Goals and CMS Choice CMS Medicare.gov Compare Post Acute Care list provided to:: Patient Choice offered to / list presented to : Patient  Discharge Placement                Patient chooses bed at:  ([name]) Patient to be transferred to facility by: [facility] Name of family member notified: [place], Lusty (Spouse) [phone] Patient and family notified of of transfer: 01/25/23  Discharge Plan and Services Additional resources added to the After Visit Summary for   In-house Referral: Clinical Social Work   Post Acute Care Choice: Skilled Nursing Facility                               Social Determinants of Health (SDOH) Interventions SDOH Screenings   Food Insecurity: No Food Insecurity (01/21/2023)  Housing: Low Risk  (01/21/2023)  Transportation Needs: No Transportation Needs (01/21/2023)  Utilities: Not At Risk (01/21/2023)  Depression (PHQ2-9): Low Risk  (05/17/2022)  Tobacco Use: Low Risk  (01/20/2023)     Readmission Risk Interventions     No data to display

## 2023-01-25 NOTE — Discharge Instructions (Signed)
Follow with Primary MD Burton Apley, MD in 7 days   Get CBC, CMP, 2 view Chest X ray -  checked next visit with your primary MD or SNF MD    Activity: As tolerated with Full fall precautions use walker/cane & assistance as needed  Disposition SNF  Diet: Heart Healthy with 1.5 L fluid restriction per day  Special Instructions: If you have smoked or chewed Tobacco  in the last 2 yrs please stop smoking, stop any regular Alcohol  and or any Recreational drug use.  On your next visit with your primary care physician please Get Medicines reviewed and adjusted.  Please request your Prim.MD to go over all Hospital Tests and Procedure/Radiological results at the follow up, please get all Hospital records sent to your Prim MD by signing hospital release before you go home.  If you experience worsening of your admission symptoms, develop shortness of breath, life threatening emergency, suicidal or homicidal thoughts you must seek medical attention immediately by calling 911 or calling your MD immediately  if symptoms less severe.  You Must read complete instructions/literature along with all the possible adverse reactions/side effects for all the Medicines you take and that have been prescribed to you. Take any new Medicines after you have completely understood and accpet all the possible adverse reactions/side effects.

## 2023-01-26 ENCOUNTER — Ambulatory Visit (HOSPITAL_BASED_OUTPATIENT_CLINIC_OR_DEPARTMENT_OTHER): Payer: Medicare Other | Admitting: Internal Medicine

## 2023-02-15 ENCOUNTER — Telehealth: Payer: Self-pay | Admitting: Internal Medicine

## 2023-02-15 NOTE — Telephone Encounter (Signed)
Pt. Husband Called to cancel hosp f/u pt. Till in rehab at [facility] and wants medical advice

## 2023-02-17 ENCOUNTER — Other Ambulatory Visit: Payer: Self-pay

## 2023-02-17 ENCOUNTER — Encounter (HOSPITAL_COMMUNITY): Payer: Self-pay

## 2023-02-17 ENCOUNTER — Observation Stay (HOSPITAL_COMMUNITY)
Admission: EM | Admit: 2023-02-17 | Discharge: 2023-02-17 | Disposition: A | Discharge status: Home / Self Care | Payer: Medicare Other | Attending: Internal Medicine | Admitting: Internal Medicine

## 2023-02-17 ENCOUNTER — Emergency Department (HOSPITAL_COMMUNITY): Payer: Medicare Other

## 2023-02-17 DIAGNOSIS — R4189 Other symptoms and signs involving cognitive functions and awareness: Secondary | ICD-10-CM

## 2023-02-17 DIAGNOSIS — Z79899 Other long term (current) drug therapy: Secondary | ICD-10-CM | POA: Diagnosis not present

## 2023-02-17 DIAGNOSIS — R404 Transient alteration of awareness: Principal | ICD-10-CM | POA: Insufficient documentation

## 2023-02-17 DIAGNOSIS — I5042 Chronic combined systolic (congestive) and diastolic (congestive) heart failure: Secondary | ICD-10-CM | POA: Insufficient documentation

## 2023-02-17 DIAGNOSIS — Z96641 Presence of right artificial hip joint: Secondary | ICD-10-CM | POA: Diagnosis not present

## 2023-02-17 DIAGNOSIS — Z96653 Presence of artificial knee joint, bilateral: Secondary | ICD-10-CM | POA: Insufficient documentation

## 2023-02-17 DIAGNOSIS — G4733 Obstructive sleep apnea (adult) (pediatric): Secondary | ICD-10-CM | POA: Diagnosis not present

## 2023-02-17 DIAGNOSIS — I110 Hypertensive heart disease with heart failure: Secondary | ICD-10-CM | POA: Insufficient documentation

## 2023-02-17 DIAGNOSIS — J9621 Acute and chronic respiratory failure with hypoxia: Secondary | ICD-10-CM | POA: Insufficient documentation

## 2023-02-17 DIAGNOSIS — Z7982 Long term (current) use of aspirin: Secondary | ICD-10-CM | POA: Insufficient documentation

## 2023-02-17 DIAGNOSIS — Z515 Encounter for palliative care: Secondary | ICD-10-CM | POA: Diagnosis not present

## 2023-02-17 DIAGNOSIS — J449 Chronic obstructive pulmonary disease, unspecified: Secondary | ICD-10-CM | POA: Insufficient documentation

## 2023-02-17 LAB — I-STAT CHEM 8, ED
BUN: 29 mg/dL — ABNORMAL HIGH (ref 8–23)
Calcium, Ion: 0.73 mmol/L — CL (ref 1.15–1.40)
Chloride: 90 mmol/L — ABNORMAL LOW (ref 98–111)
Creatinine, Ser: 0.9 mg/dL (ref 0.44–1.00)
Glucose, Bld: 207 mg/dL — ABNORMAL HIGH (ref 70–99)
HCT: 34 % — ABNORMAL LOW (ref 36.0–46.0)
Hemoglobin: 11.6 g/dL — ABNORMAL LOW (ref 12.0–15.0)
Potassium: 4.9 mmol/L (ref 3.5–5.1)
Sodium: 119 mmol/L — CL (ref 135–145)
TCO2: 21 mmol/L — ABNORMAL LOW (ref 22–32)

## 2023-02-17 LAB — I-STAT VENOUS BLOOD GAS, ED
Acid-base deficit: 2 mmol/L (ref 0.0–2.0)
Bicarbonate: 21.4 mmol/L (ref 20.0–28.0)
Calcium, Ion: 0.66 mmol/L — CL (ref 1.15–1.40)
HCT: 34 % — ABNORMAL LOW (ref 36.0–46.0)
Hemoglobin: 11.6 g/dL — ABNORMAL LOW (ref 12.0–15.0)
O2 Saturation: 94 %
Potassium: 5 mmol/L (ref 3.5–5.1)
Sodium: 120 mmol/L — ABNORMAL LOW (ref 135–145)
TCO2: 22 mmol/L (ref 22–32)
pCO2, Ven: 30 mmHg — ABNORMAL LOW (ref 44–60)
pH, Ven: 7.462 — ABNORMAL HIGH (ref 7.25–7.43)
pO2, Ven: 67 mmHg — ABNORMAL HIGH (ref 32–45)

## 2023-02-17 LAB — I-STAT CG4 LACTIC ACID, ED: Lactic Acid, Venous: 4.6 mmol/L (ref 0.5–1.9)

## 2023-02-17 MED ORDER — SODIUM CHLORIDE 0.9% FLUSH: 3.0000 mL | INTRAVENOUS | Status: DC | PRN

## 2023-02-17 MED ORDER — ONDANSETRON HCL 4 MG/2ML IJ SOLN: 4.0000 mg | Freq: Four times a day (QID) | INTRAMUSCULAR | Status: DC | PRN

## 2023-02-17 MED ORDER — LORAZEPAM 1 MG PO TABS: 1.0000 mg | ORAL_TABLET | ORAL | Status: DC | PRN

## 2023-02-17 MED ORDER — ONDANSETRON 4 MG PO TBDP: 4.0000 mg | ORAL_TABLET | Freq: Four times a day (QID) | ORAL | Status: DC | PRN

## 2023-02-17 MED ORDER — DIPHENHYDRAMINE HCL 50 MG/ML IJ SOLN: 12.5000 mg | INTRAMUSCULAR | Status: DC | PRN

## 2023-02-17 MED ORDER — LORAZEPAM 2 MG/ML IJ SOLN: 1.0000 mg | INTRAMUSCULAR | Status: DC | PRN

## 2023-02-17 MED ORDER — LORAZEPAM 2 MG/ML PO CONC
1.0000 mg | ORAL | Status: DC | PRN
  Administered 2023-02-17 (×2): 1 mg via SUBLINGUAL
  Filled 2023-02-17: qty 0.5

## 2023-02-17 MED ORDER — MORPHINE SULFATE (PF) 2 MG/ML IV SOLN
1.0000 mg | INTRAVENOUS | Status: DC | PRN
  Administered 2023-02-17 (×2): 1 mg via INTRAVENOUS
  Filled 2023-02-17 (×2): qty 1

## 2023-02-17 MED ORDER — SODIUM CHLORIDE 0.9 % IV SOLN: 250.0000 mL | INTRAVENOUS | Status: DC | PRN

## 2023-02-17 MED ORDER — SODIUM CHLORIDE 0.9 % IV BOLUS
2000.0000 mL | Freq: Once | INTRAVENOUS | Status: AC
  Administered 2023-02-17: 2000 mL via INTRAVENOUS

## 2023-02-17 MED ORDER — SODIUM CHLORIDE 0.9% FLUSH
3.0000 mL | Freq: Two times a day (BID) | INTRAVENOUS | Status: DC
  Administered 2023-02-17: 3 mL via INTRAVENOUS

## 2023-02-17 NOTE — H&P (Addendum)
History and Physical  [name]:[id number] DOB: [birth year] DOA: 02/17/2023  Referring physician: Dr. Rosalia Hammers, EDP  PCP: Burton Apley, MD  Outpatient Specialists: None Patient coming from: SNF  Chief Complaint: Unresponsive  HPI: [name] is a 77 y.o. 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  Unclear how long she was in a state of unresponsiveness.  EMS was activated and she was brought into the ED for further evaluation.  In the ED, severely hypotensive with MAP of 46, tachycardic, tachypneic, hypoxic.  She was promptly placed on BiPAP.  Chest x-ray revealed bilateral patchy airspace opacities, severe right sided pulmonary infiltrates, small left pleural effusion.  Lab studies were notable for serum sodium 119, glucose 207, BUN 29 with creatinine of 0.90.  Lactic acid 4.6.  Hemoglobin 11.6 K.  EDP discussed with the patient's husband and son at bedside.  Family made decision for comfort care only.  Palliative care medicine has been consulted.  TRH, hospitalist service, was asked to admit for comfort care measures.  Admitted to MedSurg unit as observation status for comfort care only.  Multiple family members are present at bedside at the time of this visit.  The patient appears comfortable.  ED Course: Temperature 98.6.  BP 104/89, pulse 108, respiration rate 32, O2 saturation 100% on BiPAP.  Lab studies as stated above.  Review of Systems: Review of systems as noted in the HPI. All other systems reviewed and are negative.   Past Medical History:  Diagnosis Date   Acute asthmatic bronchitis    Allergic rhinitis    Anxiety    Arthritis    Esophageal reflux    Hypertension    Interstitial lung disease (HCC)  dx jan 2020   Irritable bowel syndrome    Oxygen dependent    4 liters day time 6 liters at night   PONV (postoperative nausea and vomiting)    ponv likes zofran, and scopolamine patch   Pulmonary hypertension (HCC)    Rheumatoid arthritis(714.0)    Sleep apnea    Past Surgical History:  Procedure Laterality Date    c secttion  1977   2 foot fusions Left    total of 6 left foot sx   ABDOMINAL HYSTERECTOMY     complete    ANKLE FUSION  2009   left   BACK SURGERY     lower l to l 5 fused   CHOLECYSTECTOMY     RIGHT HEART CATH N/A 11/09/2020   Procedure: RIGHT HEART CATH;  Surgeon: Dolores Patty, MD;  Location: [facility] INVASIVE CV LAB;  Service: Cardiovascular;  Laterality: N/A;   RIGHT/LEFT HEART CATH AND CORONARY ANGIOGRAPHY N/A 09/16/2016   Procedure: Right/Left Heart Cath and Coronary Angiography;  Surgeon: [name], MD;  Location: [facility] INVASIVE CV LAB;  Service: Cardiovascular;  Laterality: N/A;   RIGHT/LEFT HEART CATH AND CORONARY ANGIOGRAPHY N/A 12/17/2021   Procedure: RIGHT/LEFT HEART CATH AND CORONARY ANGIOGRAPHY;  Surgeon: Dolores Patty, MD;  Location: [facility] INVASIVE CV LAB;  Service: Cardiovascular;  Laterality: N/A;   TONSILLECTOMY     TOTAL HIP ARTHROPLASTY Right 12/20/2018   Procedure: TOTAL HIP ARTHROPLASTY ANTERIOR APPROACH;  Surgeon: Durene Romans, MD;  Location: [facility] ORS;  Service: Orthopedics;  Laterality: Right;  70 mins   TOTAL KNEE  ARTHROPLASTY Bilateral     Social History:  reports that she has never smoked. She has never used smokeless tobacco. She reports that she does not currently use alcohol after a past usage of about 7.0 standard drinks of alcohol per week. She reports that she does not use drugs.   Allergies  Allergen Reactions   Cefdinir Diarrhea   Erythromycin Base Diarrhea   Lactose Diarrhea   Lactulose Diarrhea   Mesalamine Nausea Only   Methotrexate Other (See Comments)    Felt sick   Nitrofuran Derivatives     shakiness   Other  Diarrhea and Other (See Comments)    Shaking uncontrollably "lettuce only"    Sulfa Antibiotics Other (See Comments)    Patient can't recall reaction, but made her feel "really sick"    Tdap [Tetanus-Diphth-Acell Pertussis]     Shaking uncontrollably   Tetanus Toxoid, Adsorbed Other (See Comments)    Shaking uncontrollable     Family History  Problem Relation Age of Onset   Heart disease Mother    Arthritis Mother    Heart attack Father    Diabetes Other        sibling   Heart attack Other        sibling      Prior to Admission medications   Medication Sig Start Date End Date Taking? Authorizing Provider  albuterol (VENTOLIN HFA) 108 (90 Base) MCG/ACT inhaler USE 2 INHALATIONS BY MOUTH EVERY 6 HOURS AS NEEDED Patient taking differently: Inhale 2 puffs into the lungs every 6 (six) hours as needed for wheezing or shortness of breath. 11/23/22   Kalman Shan, MD  aspirin EC 81 MG tablet Take 81 mg by mouth daily.    [provider]  benzonatate (TESSALON) 200 MG capsule TAKE 1 CAPSULE (200 MG TOTAL) BY MOUTH 3 (THREE) TIMES DAILY AS NEEDED FOR COUGH. Patient taking differently: Take 200 mg by mouth daily. 11/30/22   Kalman Shan, MD  cetirizine (ZYRTEC) 10 MG tablet Take 20 mg by mouth daily.    [provider]  Cholecalciferol (VITAMIN D3) 250 MCG (10000 UT) capsule Take 10,000 Units by mouth daily.    [provider]  clidinium-chlordiazePOXIDE (LIBRAX) 5-2.5 MG capsule Take 1 capsule by mouth 3 (three) times daily as needed (for IBS).    [provider]  colestipol (COLESTID) 1 g tablet Take 1 g by mouth daily.    [provider]  dicyclomine (BENTYL) 20 MG tablet Take 20 mg by mouth 2 (two) times daily as needed (IBS).    [provider]  ESBRIET 801 MG TABS Take 1 tablet (801 mg total) by mouth 3 (three) times daily. 01/24/23   Kalman Shan, MD  FLUoxetine (PROZAC) 20 MG capsule Take 20 mg by mouth daily. 08/11/16    [provider]  fluticasone (FLONASE) 50 MCG/ACT nasal spray SPRAY 2 SPRAYS INTO EACH NOSTRIL EVERY DAY Patient taking differently: Place 2 sprays into both nostrils daily. 04/01/20   Coral Ceo, NP  furosemide (LASIX) 80 MG tablet Take 1 tablet (80 mg total) by mouth daily. Patient taking differently: Take 40 mg by mouth daily. 09/15/22   End, Cristal Deer, MD  ipratropium (ATROVENT) 0.03 % nasal spray USE 1 TO 2 SPRAYS IN BOTH  NOSTRILS TWICE DAILY 05/30/22   Kalman Shan, MD  ipratropium-albuterol (DUONEB) 0.5-2.5 (3) MG/3ML SOLN USE 1 VIAL IN NEBULIZER EVERY 6 HOURS AS NEEDED J45.901 Patient taking differently: Take 3 mLs by nebulization at bedtime. 09/19/22  Kalman Shan, MD  leflunomide (ARAVA) 20 MG tablet Take 1 tablet (20 mg total) by mouth daily. 05/17/22   Zannie Cove, MD  loperamide (IMODIUM) 2 MG capsule Take 1 capsule (2 mg total) by mouth 2 (two) times daily as needed for diarrhea or loose stools. 05/03/22   Zannie Cove, MD  Menthol, Topical Analgesic, (BIOFREEZE COOL THE PAIN) 4 % GEL Apply 1 Application topically 2 (two) times daily as needed (pain).    [provider]  methocarbamol (ROBAXIN) 750 MG tablet Take 750 mg by mouth at bedtime. for muscle spasms 07/22/20   [provider]  montelukast (SINGULAIR) 10 MG tablet TAKE 1 TABLET BY MOUTH AT  BEDTIME 05/30/22   Kalman Shan, MD  omeprazole (PRILOSEC) 40 MG capsule Take 40 mg by mouth at bedtime.     [provider]  Oxycodone HCl 10 MG TABS Take 1 tablet (10 mg total) by mouth 3 (three) times daily. 01/25/23   Leroy Sea, MD  OXYGEN Inhale 7 L into the lungs continuous.    [provider]  Polyethyl Glycol-Propyl Glycol (SYSTANE) 0.4-0.3 % GEL ophthalmic gel Place 2 application  into both eyes 2 (two) times daily as needed (dry eyes).    [provider]  potassium chloride SA (KLOR-CON M) 20 MEQ tablet TAKE 1 TABLET BY MOUTH TWICE  DAILY 09/19/22    End, Cristal Deer, MD  predniSONE (DELTASONE) 5 MG tablet Take 60 mg pill for the next 3 days followed by 40 mg pill for the next 3 days followed by 20 mg pills for the next 3 days followed by 10 mg pill for the next 3 days thereafter take 5 mg pills daily nonstop as your chronic regimen. 01/25/23   Leroy Sea, MD  rosuvastatin (CRESTOR) 20 MG tablet TAKE 1 TABLET BY MOUTH DAILY 11/22/22   End, Cristal Deer, MD  traZODone (DESYREL) 100 MG tablet Take 100 mg by mouth at bedtime.    [provider]  TRELEGY ELLIPTA 100-62.5-25 MCG/ACT AEPB USE 1 INHALATION BY MOUTH ONCE  DAILY AT THE SAME TIME EACH DAY Patient taking differently: Inhale 1 puff into the lungs daily. 09/22/22   Kalman Shan, MD  TYVASO DPI MAINTENANCE KIT 64 MCG POWD Inhale 1 puff into the lungs in the morning, at noon, in the evening, and at bedtime. 12/15/22   Kalman Shan, MD  verapamil (CALAN-SR) 120 MG CR tablet Take 1 tablet (120 mg total) by mouth daily. 09/19/22   End, Cristal Deer, MD    Physical Exam: BP 104/89   Pulse (!) 108   Temp 98.6 F (37 C) (Rectal)   Resp (!) 32   Ht 5' (1.524 m)   Wt 75.1 kg   SpO2 100%   BMI 32.33 kg/m   General: 77 y.o. year-old female well developed well nourished in no acute distress.  Obtunded.. Cardiovascular: Regular rate and rhythm with no rubs or gallops.  No thyromegaly or JVD noted.  No lower extremity edema. 2/4 pulses in all 4 extremities. Respiratory: Diffuse rales bilaterally with poor inspiratory effort. Abdomen: Soft nontender nondistended with normal bowel sounds x4 quadrants. Muskuloskeletal: No cyanosis, clubbing or edema noted bilaterally Neuro: CN II-XII intact, strength, sensation, reflexes Skin: No ulcerative lesions noted or rashes Psychiatry: Unable to assess judgment and mood due to obtundation.          Labs on Admission:  Basic Metabolic Panel: Recent Labs  Lab 02/17/23 1251 02/17/23 1252  NA 120* 119*  K 5.0 4.9  CL  --  90*   GLUCOSE  --  207*  BUN  --  29*  CREATININE  --  0.90   Liver Function Tests: No results for input(s): "AST", "ALT", "ALKPHOS", "BILITOT", "PROT", "ALBUMIN" in the last 168 hours. No results for input(s): "LIPASE", "AMYLASE" in the last 168 hours. No results for input(s): "AMMONIA" in the last 168 hours. CBC: Recent Labs  Lab 02/17/23 1251 02/17/23 1252  HGB 11.6* 11.6*  HCT 34.0* 34.0*   Cardiac Enzymes: No results for input(s): "CKTOTAL", "CKMB", "CKMBINDEX", "TROPONINI" in the last 168 hours.  BNP (last 3 results) Recent Labs    01/23/23 0748 01/24/23 0321 01/25/23 0157  BNP 189.0* 166.8* 229.8*    ProBNP (last 3 results) Recent Labs    06/07/22 1143 08/11/22 1017  PROBNP 25.0 54.0    CBG: No results for input(s): "GLUCAP" in the last 168 hours.  Radiological Exams on Admission: DG Chest Port 1 View  Result Date: 02/17/2023 CLINICAL DATA:  sob EXAM: PORTABLE CHEST 1 VIEW COMPARISON:  CXR 01/22/23 FINDINGS: Small left pleural effusion. No pneumothorax. Redemonstrated bilateral patchy airspace opacities increased in the right upper and lower lung fields. Unchanged cardiac and mediastinal contours. No radiographically apparent displaced rib fracture. Visualized upper abdomen is unremarkable. IMPRESSION: 1. Redemonstrated bilateral patchy airspace opacities increased in the right upper and lower lung fields. 2. Small left pleural effusion. Electronically Signed   By: Lorenza Cambridge M.D.   On: 02/17/2023 14:14    EKG: I independently viewed the EKG done and my findings are as followed: Sinus tachycardia rate of 127.  Nonspecific ST-T changes.  QTc 435.  Assessment/Plan Present on Admission:  Unresponsiveness  Principal Problem:   Unresponsiveness  Unresponsiveness, unclear etiology Severe hypovolemic hyponatremia Acute on chronic hypoxic respiratory failure Lactic acidosis Chronic combined systolic and diastolic CHF with LVEF of 40 to 45% grade 1 diastolic  dysfunction Pulmonary artery hypertension COPD with chronic bronchitis and emphysema Asthmatic bronchitis Pulmonary fibrosis on 7 L continuously Prediabetes with hyperglycemia Obesity Made comfort care only per husband at bedside, continue comfort care measures. Pleasure feedings Palliative care medicine consulted by EDP. TOC consulted to assist with disposition.   Time: 55 minutes.   DVT prophylaxis: Comfort care measures  Code Status: DNR/comfort care.  Family Communication: None at bedside  Disposition Plan: Admitted to MedSurg unit  Consults called: Palliative care medicine consulted by EDP, TOC.  Admission status: Observation status.   Status is: Observation    Darlin Drop MD [facility] Pager [phone]  If 7PM-7AM, please contact night-coverage [email or web address] Password [facility]  02/17/2023, 2:29 PM

## 2023-02-17 NOTE — Consult Note (Signed)
Consultation Note Date: 02/17/2023   Patient Name: [name]  DOB: 1945-09-21  MRN: [id number]  Age / Sex: 77 y.o., female  PCP: Hailey Apley, MD Referring Physician: Darlin Drop, DO  Reason for Consultation: Establishing goals of care  HPI/Patient Profile: 77 y.o. 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   Patient was recently hospitalized 7/5-7/10 for acute on chronic hypoxic respiratory failure secondary to ILD/pulmonary fibrosis and HFpEF exacerbation. She was found unresponsive at SNF this morning with saturations in the 70's, requiring bagging and BiPAP for respiratory support given DNI status. She was severely hypotensive, as low as 58/38, which has improved after IV fluids. Patient's family made the decision to transition to comfort care after discussion with ED provider.  PMT has been consulted to assist with goals of care conversation.  Clinical Assessment and Goals of Care:  I have reviewed medical records including EPIC notes, labs and imaging, discussed with RN, assessed the patient and then met at the bedside with patient's husband/HCPOA, son, daughter, DIL, and granddaughter to discuss diagnosis prognosis, GOC, EOL wishes, disposition and options.  I introduced Palliative Medicine as specialized medical care for people living with serious illness. It focuses on providing relief from the symptoms and stress of a serious illness. The goal is to improve quality of life for both the patient and the family.  We discussed a brief life review of the patient and then focused on their current illness.   I  attempted to elicit values and goals of care important to the patient.    Medical History Review and Understanding:  Patient's family have a good understanding of the severity of her illness. Husband shares that he fears she may only have a couple of days at best.  Functional and Nutritional State: Patient has remained unable to ambulate since last admission.  Palliative Symptoms: Anxiety, shortness of breath, hip pain  Advance Directives: A detailed discussion regarding advanced directives was had. Patient's husband is reportedly HCPOA.  Code Status: DNR/DNI confirmed.  Discussion: Met with family and reviewed their last conversation with PMT during her previous admission.  Unfortunately, patient has remained unable to ambulate with worsening quality of life despite rehabilitation attempt.  Family does not want to see her suffer any further and are quite frustrated that she did not get the prn Ativan that she needed at [facility].    We reviewed inpatient comfort care measures and that patient would no longer receive aggressive medical interventions such as continuous vital signs, lab work, radiology testing, or medications not focused on comfort. All care would focus on how the patient is looking and feeling. This would include management of any symptoms that may cause discomfort, pain, shortness of breath, cough, nausea, agitation, anxiety, and/or secretions etc. Symptoms would be managed with medications and other non-pharmacological interventions such as spiritual support if requested, repositioning, music therapy, or therapeutic listening. Family verbalized understanding and appreciation.   We discussed her options on home hospice versus residential hospice.  I shared my concern that patient likely has a prognosis of days at best and that she is an appropriate candidate for hospice facility if they are interested.  Husband initially wants to take her home, but daughter notes that she has  prior experience with [facility] and husband is agreeable to pursue this referral.   Discussed the importance of continued conversation with family and the medical providers regarding overall plan of care and treatment options, ensuring decisions are within the context of the patient's values and GOCs.   Questions and concerns were addressed. The family was encouraged to call with questions or concerns.  PMT will continue to support holistically.   SUMMARY OF RECOMMENDATIONS   -Continue DNR/DNI -Continue comfort focused care per [facility] -Patient's family would like a referral to [facility], discussed with TOC and [facility] hospital liaison -Psychosocial and emotional support provided -PMT will continue to follow and support as needed  Prognosis:  Days  Discharge Planning: Hospice facility      Primary Diagnoses: Present on Admission:  Unresponsiveness    Physical Exam Vitals and nursing note reviewed.  Constitutional:      Comments: 7L HFNC  Cardiovascular:     Rate and Rhythm: Tachycardia present.  Pulmonary:     Effort: Pulmonary effort is normal.  Skin:    General: Skin is warm and dry.  Neurological:     Mental Status: She is lethargic.  Psychiatric:        Cognition and Memory: Cognition is impaired.     Vital Signs: BP 104/89   Pulse (!) 108   Temp 98.6 F (37 C) (Rectal)   Resp (!) 32   Ht 5' (1.524 m)   Wt 75.1 kg   SpO2 100%   BMI 32.33 kg/m  Pain Scale: Faces      SpO2: SpO2: 100 % O2 Device:SpO2: 100 % O2 Flow Rate: .O2 Flow Rate (L/min): 7 L/min   Palliative Assessment/Data: 20%     MDM: High     Hailey Salles, PA-C  Palliative Medicine Team Team phone # [phone]  Thank you for allowing the Palliative Medicine Team to assist in the care of this patient. Please utilize secure chat with additional questions, if there is no response within 30 minutes please call the above phone number.  Palliative Medicine Team providers are  available by phone from 7am to 7pm daily and can be reached through the team cell phone.  Should this patient require assistance outside of these hours, please call the patient's attending physician.

## 2023-02-17 NOTE — Progress Notes (Signed)
Per ED MD, RT removed BiPAP from pt and place pt on 7L [place] (what she wears at home)

## 2023-02-17 NOTE — ED Provider Notes (Addendum)
[place] EMERGENCY DEPARTMENT AT [facility] Provider Note   CSN: [id number] Arrival date & time: 02/17/23  1236     History  Chief Complaint  Patient presents with   unresponsive    [name] is a 77 y.o. female.  HPI 77 year old female presents via [facility] [address].  They Report That They Were Called to [facility] with Reports That the Patient Was Unresponsive.  They Report That She Is DNR Review of Discharge Summary from 01/25/2023 Shows That She Was Admitted at That Time for Acute on Chronic Hypoxic Respiratory Failure Secondary to ILD, Pulmonary Fibrosis, High Flow EF with Acute Next Presentation Multifactorial Secondary to above.  She Was Continued on 7 L of Oxygen.  Is DNR.  Her Pulmonary Status Improved during Hospitalization Back to Baseline 7 L of Oxygen.  She Has DNR with Her. EMS Reports That She Was Hypotensive Systolic Blood Pressure about 90 Sats in the 70s and CBG Was Normal on Their Arrival.     Home Medications Prior to Admission medications   Medication Sig Start Date End Date Taking? Authorizing Provider  albuterol (VENTOLIN HFA) 108 (90 Base) MCG/ACT inhaler USE 2 INHALATIONS BY MOUTH EVERY 6 HOURS AS NEEDED Patient taking differently: Inhale 2 puffs into the lungs every 6 (six) hours as needed for wheezing or shortness of breath. 11/23/22   Kalman Shan, MD  aspirin EC 81 MG tablet Take 81 mg by mouth daily.    [provider]  benzonatate (TESSALON) 200 MG capsule TAKE 1 CAPSULE (200 MG TOTAL) BY MOUTH 3 (THREE) TIMES DAILY AS NEEDED FOR COUGH. Patient taking differently: Take 200 mg by mouth daily. 11/30/22   Kalman Shan, MD  cetirizine (ZYRTEC) 10 MG tablet Take 20 mg by mouth daily.    [provider]  Cholecalciferol (VITAMIN D3) 250 MCG (10000 UT) capsule Take 10,000 Units by mouth daily.    [provider]  clidinium-chlordiazePOXIDE (LIBRAX) 5-2.5 MG capsule Take 1 capsule by  mouth 3 (three) times daily as needed (for IBS).    [provider]  colestipol (COLESTID) 1 g tablet Take 1 g by mouth daily.    [provider]  dicyclomine (BENTYL) 20 MG tablet Take 20 mg by mouth 2 (two) times daily as needed (IBS).    [provider]  ESBRIET 801 MG TABS Take 1 tablet (801 mg total) by mouth 3 (three) times daily. 01/24/23   Kalman Shan, MD  FLUoxetine (PROZAC) 20 MG capsule Take 20 mg by mouth daily. 08/11/16   [provider]  fluticasone (FLONASE) 50 MCG/ACT nasal spray SPRAY 2 SPRAYS INTO EACH NOSTRIL EVERY DAY Patient taking differently: Place 2 sprays into both nostrils daily. 04/01/20   Coral Ceo, NP  furosemide (LASIX) 80 MG tablet Take 1 tablet (80 mg total) by mouth daily. Patient taking differently: Take 40 mg by mouth daily. 09/15/22   End, Cristal Deer, MD  ipratropium (ATROVENT) 0.03 % nasal spray USE 1 TO 2 SPRAYS IN BOTH  NOSTRILS TWICE DAILY 05/30/22   Kalman Shan, MD  ipratropium-albuterol (DUONEB) 0.5-2.5 (3) MG/3ML SOLN USE 1 VIAL IN NEBULIZER EVERY 6 HOURS AS NEEDED J45.901 Patient taking differently: Take 3 mLs by nebulization at bedtime. 09/19/22   Kalman Shan, MD  leflunomide (ARAVA) 20 MG tablet Take 1 tablet (20 mg total) by mouth daily. 05/17/22   Zannie Cove, MD  loperamide (IMODIUM) 2 MG capsule Take 1 capsule (2 mg total) by mouth 2 (two)  times daily as needed for diarrhea or loose stools. 05/03/22   Zannie Cove, MD  Menthol, Topical Analgesic, (BIOFREEZE COOL THE PAIN) 4 % GEL Apply 1 Application topically 2 (two) times daily as needed (pain).    [provider]  methocarbamol (ROBAXIN) 750 MG tablet Take 750 mg by mouth at bedtime. for muscle spasms 07/22/20   [provider]  montelukast (SINGULAIR) 10 MG tablet TAKE 1 TABLET BY MOUTH AT  BEDTIME 05/30/22   Kalman Shan, MD  omeprazole (PRILOSEC) 40 MG capsule Take 40 mg by mouth at bedtime.     [provider]  Oxycodone HCl 10 MG TABS Take 1 tablet (10 mg total) by mouth 3 (three) times daily. 01/25/23   Leroy Sea, MD  OXYGEN Inhale 7 L into the lungs continuous.    [provider]  Polyethyl Glycol-Propyl Glycol (SYSTANE) 0.4-0.3 % GEL ophthalmic gel Place 2 application  into both eyes 2 (two) times daily as needed (dry eyes).    [provider]  potassium chloride SA (KLOR-CON M) 20 MEQ tablet TAKE 1 TABLET BY MOUTH TWICE  DAILY 09/19/22   End, Cristal Deer, MD  predniSONE (DELTASONE) 5 MG tablet Take 60 mg pill for the next 3 days followed by 40 mg pill for the next 3 days followed by 20 mg pills for the next 3 days followed by 10 mg pill for the next 3 days thereafter take 5 mg pills daily nonstop as your chronic regimen. 01/25/23   Leroy Sea, MD  rosuvastatin (CRESTOR) 20 MG tablet TAKE 1 TABLET BY MOUTH DAILY 11/22/22   End, Cristal Deer, MD  traZODone (DESYREL) 100 MG tablet Take 100 mg by mouth at bedtime.    [provider]  TRELEGY ELLIPTA 100-62.5-25 MCG/ACT AEPB USE 1 INHALATION BY MOUTH ONCE  DAILY AT THE SAME TIME EACH DAY Patient taking differently: Inhale 1 puff into the lungs daily. 09/22/22   Kalman Shan, MD  TYVASO DPI MAINTENANCE KIT 64 MCG POWD Inhale 1 puff into the lungs in the morning, at noon, in the evening, and at bedtime. 12/15/22   Kalman Shan, MD  verapamil (CALAN-SR) 120 MG CR tablet Take 1 tablet (120 mg total) by mouth daily. 09/19/22   End, Cristal Deer, MD      Allergies    Cefdinir; Erythromycin base; Lactose; Lactulose; Mesalamine; Methotrexate; Nitrofuran derivatives; Other; Sulfa antibiotics; Tdap [tetanus-diphth-acell pertussis]; and Tetanus toxoid, adsorbed    Review of Systems   Review of Systems  Physical Exam Updated Vital Signs BP 104/89   Pulse (!) 108   Temp 98.6 F (37 C) (Rectal)   Resp (!) 32   Ht 1.524 m (5')   Wt 75.1 kg   SpO2 100%   BMI 32.33 kg/m  Physical Exam Vitals reviewed.   Constitutional:      General: She is in acute distress.     Appearance: She is ill-appearing.     Comments: Eyes are open and patient is attempting to breathing on her own She does appear to be in acute distress and she is not responding to verbal stimuli  HENT:     Head: Normocephalic.     Right Ear: External ear normal.     Left Ear: External ear normal.     Nose: Nose normal.     Mouth/Throat:     Mouth: Mucous membranes are dry.  Eyes:     Pupils: Pupils are equal, round, and reactive to light.  Cardiovascular:  Rate and Rhythm: Regular rhythm. Tachycardia present.     Pulses: Normal pulses.  Pulmonary:     Effort: Pulmonary effort is normal.     Breath sounds: Normal breath sounds.  Abdominal:     General: Abdomen is flat.  Musculoskeletal:        General: Normal range of motion.     Cervical back: Normal range of motion.  Skin:    Capillary Refill: Capillary refill takes less than 2 seconds.     Coloration: Skin is pale.  Neurological:     General: No focal deficit present.  Psychiatric:        Mood and Affect: Mood normal.     ED Results / Procedures / Treatments   Labs (all labs ordered are listed, but only abnormal results are displayed) Labs Reviewed  I-STAT VENOUS BLOOD GAS, ED - Abnormal; Notable for the following components:      Result Value   pH, Ven 7.462 (*)    pCO2, Ven 30.0 (*)    pO2, Ven 67 (*)    Sodium 120 (*)    Calcium, Ion 0.66 (*)    HCT 34.0 (*)    Hemoglobin 11.6 (*)    All other components within normal limits  I-STAT CG4 LACTIC ACID, ED - Abnormal; Notable for the following components:   Lactic Acid, Venous 4.6 (*)    All other components within normal limits  I-STAT CHEM 8, ED - Abnormal; Notable for the following components:   Sodium 119 (*)    Chloride 90 (*)    BUN 29 (*)    Glucose, Bld 207 (*)    Calcium, Ion 0.73 (*)    TCO2 21 (*)    Hemoglobin 11.6 (*)    HCT 34.0 (*)    All other components within normal limits   BLOOD GAS, ARTERIAL  LACTIC ACID, PLASMA  CBC  COMPREHENSIVE METABOLIC PANEL  BRAIN NATRIURETIC PEPTIDE    EKG EKG Interpretation Date/Time:  Friday February 17 2023 12:50:31 EDT Ventricular Rate:  127 PR Interval:  133 QRS Duration:  92 QT Interval:  299 QTC Calculation: 435 R Axis:   -39  Text Interpretation: Sinus tachycardia Left axis deviation Consider anterior infarct Confirmed by Margarita Grizzle [id number]) on 02/17/2023 1:37:53 PM  Radiology DG Chest Port 1 View  Result Date: 02/17/2023 CLINICAL DATA:  sob EXAM: PORTABLE CHEST 1 VIEW COMPARISON:  CXR 01/22/23 FINDINGS: Small left pleural effusion. No pneumothorax. Redemonstrated bilateral patchy airspace opacities increased in the right upper and lower lung fields. Unchanged cardiac and mediastinal contours. No radiographically apparent displaced rib fracture. Visualized upper abdomen is unremarkable. IMPRESSION: 1. Redemonstrated bilateral patchy airspace opacities increased in the right upper and lower lung fields. 2. Small left pleural effusion. Electronically Signed   By: Lorenza Cambridge M.D.   On: 02/17/2023 14:14    Procedures .Critical Care  Performed by: Margarita Grizzle, MD Authorized by: Margarita Grizzle, MD   Critical care provider statement:    Critical care time (minutes):  45   Critical care was time spent personally by me on the following activities:  Development of treatment plan with patient or surrogate, discussions with consultants, evaluation of patient's response to treatment, examination of patient, ordering and review of laboratory studies, ordering and review of radiographic studies, ordering and performing treatments and interventions, pulse oximetry, re-evaluation of patient's condition and review of old charts     Medications Ordered in ED Medications  sodium chloride flush (NS) 0.9 % injection  3 mL (has no administration in time range)  sodium chloride flush (NS) 0.9 % injection 3 mL (has no administration in  time range)  0.9 %  sodium chloride infusion (has no administration in time range)  morphine (PF) 2 MG/ML injection 1 mg (has no administration in time range)  LORazepam (ATIVAN) tablet 1 mg (has no administration in time range)    Or  LORazepam (ATIVAN) 2 MG/ML concentrated solution 1 mg (has no administration in time range)    Or  LORazepam (ATIVAN) injection 1 mg (has no administration in time range)  diphenhydrAMINE (BENADRYL) injection 12.5 mg (has no administration in time range)  ondansetron (ZOFRAN-ODT) disintegrating tablet 4 mg (has no administration in time range)    Or  ondansetron (ZOFRAN) injection 4 mg (has no administration in time range)  sodium chloride 0.9 % bolus 2,000 mL (0 mLs Intravenous Stopped 02/17/23 1342)    ED Course/ Medical Decision Making/ A&P                                 Medical Decision Making Amount and/or Complexity of Data Reviewed Labs: ordered. Radiology: ordered.  Risk Decision regarding hospitalization.   Patient placed on BiPAP here She is tachycardic to 117 and blood pressure 67/49 I called and spoke with her spouse Damiya Sandefur who is her power of attorney.  He is on his way to the hospital.  He states that she is to be comfort care only.  We had extensive conversation confirming this. Husband and daughter now at bedside.  Patient's blood pressure has improved.  She would like to have the BiPAP removed. We again had an extensive conversation about comfort care.  They would like to proceed with comfort care.  We will DC BiPAP. Care discussed with Dr. Margo Aye Consult to palliative care placed         Final Clinical Impression(s) / ED Diagnoses Final diagnoses:  Acute on chronic respiratory failure with hypoxia [facility])  Admission for palliative care    Rx / DC Orders ED Discharge Orders     None         Margarita Grizzle, MD 02/17/23 1429    Margarita Grizzle, MD 02/17/23 1435

## 2023-02-17 NOTE — ED Notes (Signed)
Per Dr Rosalia Hammers she wanted pt to only get only 1,026mL of NS

## 2023-02-17 NOTE — Progress Notes (Signed)
[facility] Liaison Note  Referral received for patient/family interest in [facility]. Chart under review by [facility] physician.  Bed offered and accepted for transfer today to [facility]. Unit RN please call report to [phone] prior to patient leaving the unit. Please send signed DNR and paperwork with patient.   Please leave all IV access and ports in place.   Please call with any questions or concerns. Thank you  Dionicio Stall, LCSW [facility] hospital liaison [phone]

## 2023-02-17 NOTE — ED Notes (Signed)
ED TO INPATIENT HANDOFF REPORT  ED Nurse Name and Phone #: Morrie Sheldon RN [phone]  S Name/Age/Gender [name] 77 y.o. female Room/Bed: 045C/045C  Code Status   Code Status: DNR  Home/SNF/Other Nursing Home Disoriented x4 Is this baseline? No   Triage Complete: Triage complete  Chief Complaint Unresponsiveness [R41.89]  Triage Note Pt arrived via [facility] from [facility]. Pt was found to be unresponsive by staff. Pt's LKW is unknown. Per EMS, staff told them they believe pt was sleeping around 1030 today then went back in and found pt unresponsive. Pt arrived being bagged by EMS. Per EMS, once they started bagging her she started to have purposeful movements. EDP at bedside   Allergies Allergies  Allergen Reactions   Cefdinir Diarrhea   Erythromycin Base Diarrhea   Lactose Diarrhea   Lactulose Diarrhea   Mesalamine Nausea Only   Methotrexate Other (See Comments)    Felt sick   Nitrofuran Derivatives     shakiness   Other Diarrhea and Other (See Comments)    Shaking uncontrollably "lettuce only"    Sulfa Antibiotics Other (See Comments)    Patient can't recall reaction, but made her feel "really sick"    Tdap [Tetanus-Diphth-Acell Pertussis]     Shaking uncontrollably   Tetanus Toxoid, Adsorbed Other (See Comments)    Shaking uncontrollable     Level of Care/Admitting Diagnosis ED Disposition     ED Disposition  Admit   Condition  --   Comment  Hospital Area: [facility] [[id number]]  Level of Care: Med-Surg [16]  May place patient in observation at [facility] or [facility] if equivalent level of care is available:: Yes  Covid Evaluation: Asymptomatic - no recent exposure (last 10 days) testing not required  Diagnosis: Unresponsiveness [[id number]]  Admitting Physician: Darlin Drop [[id number]]  Attending Physician: Darlin Drop [[id number]]          B Medical/Surgery History Past Medical History:  Diagnosis Date    Acute asthmatic bronchitis    Allergic rhinitis    Anxiety    Arthritis    Esophageal reflux    Hypertension    Interstitial lung disease (HCC) dx jan 2020   Irritable bowel syndrome    Oxygen dependent    4 liters day time 6 liters at night   PONV (postoperative nausea and vomiting)    ponv likes zofran, and scopolamine patch   Pulmonary hypertension (HCC)    Rheumatoid arthritis(714.0)    Sleep apnea    Past Surgical History:  Procedure Laterality Date    c secttion  1977   2 foot fusions Left    total of 6 left foot sx   ABDOMINAL HYSTERECTOMY     complete    ANKLE FUSION  2009   left   BACK SURGERY     lower l to l 5 fused   CHOLECYSTECTOMY     RIGHT HEART CATH N/A 11/09/2020   Procedure: RIGHT HEART CATH;  Surgeon: Dolores Patty, MD;  Location: [facility] INVASIVE CV LAB;  Service: Cardiovascular;  Laterality: N/A;   RIGHT/LEFT HEART CATH AND CORONARY ANGIOGRAPHY N/A 09/16/2016   Procedure: Right/Left Heart Cath and Coronary Angiography;  Surgeon: [name], MD;  Location: [facility] INVASIVE CV LAB;  Service: Cardiovascular;  Laterality: N/A;   RIGHT/LEFT HEART CATH AND CORONARY ANGIOGRAPHY N/A 12/17/2021   Procedure: RIGHT/LEFT HEART CATH AND CORONARY ANGIOGRAPHY;  Surgeon: Dolores Patty, MD;  Location: [facility] INVASIVE CV  LAB;  Service: Cardiovascular;  Laterality: N/A;   TONSILLECTOMY     TOTAL HIP ARTHROPLASTY Right 12/20/2018   Procedure: TOTAL HIP ARTHROPLASTY ANTERIOR APPROACH;  Surgeon: Durene Romans, MD;  Location: [facility] ORS;  Service: Orthopedics;  Laterality: Right;  70 mins   TOTAL KNEE ARTHROPLASTY Bilateral      A IV Location/Drains/Wounds Patient Lines/Drains/Airways Status     Active Line/Drains/Airways     Name Placement date Placement time Site Days   Peripheral IV 02/17/23 22 G Left;Posterior Hand 02/17/23  1300  Hand  less than 1   Peripheral IV 02/17/23 20 G 1.88" Anterior;Right Forearm 02/17/23  1312  Forearm  less than 1   Wound / Incision (Open or  Dehisced) 01/21/23 Other (Comment) Pretibial Left 01/21/23  1452  Pretibial  27   Wound / Incision (Open or Dehisced) 01/21/23 Sacrum Medial 01/21/23  1453  Sacrum  27            Intake/Output Last 24 hours  Intake/Output Summary (Last 24 hours) at 02/17/2023 1531 Last data filed at 02/17/2023 1342 Gross per 24 hour  Intake 1000 ml  Output --  Net 1000 ml    Labs/Imaging Results for orders placed or performed during the hospital encounter of 02/17/23 (from the past 48 hour(s))  I-Stat venous blood gas, ED     Status: Abnormal   Collection Time: 02/17/23 12:51 PM  Result Value Ref Range   pH, Ven 7.462 (H) 7.25 - 7.43   pCO2, Ven 30.0 (L) 44 - 60 mmHg   pO2, Ven 67 (H) 32 - 45 mmHg   Bicarbonate 21.4 20.0 - 28.0 mmol/L   TCO2 22 22 - 32 mmol/L   O2 Saturation 94 %   Acid-base deficit 2.0 0.0 - 2.0 mmol/L   Sodium 120 (L) 135 - 145 mmol/L   Potassium 5.0 3.5 - 5.1 mmol/L   Calcium, Ion 0.66 (LL) 1.15 - 1.40 mmol/L   HCT 34.0 (L) 36.0 - 46.0 %   Hemoglobin 11.6 (L) 12.0 - 15.0 g/dL   Sample type VENOUS    Comment NOTIFIED PHYSICIAN   I-Stat CG4 Lactic Acid, ED     Status: Abnormal   Collection Time: 02/17/23 12:52 PM  Result Value Ref Range   Lactic Acid, Venous 4.6 (HH) 0.5 - 1.9 mmol/L   Comment NOTIFIED PHYSICIAN   I-stat chem 8, ed     Status: Abnormal   Collection Time: 02/17/23 12:52 PM  Result Value Ref Range   Sodium 119 (LL) 135 - 145 mmol/L   Potassium 4.9 3.5 - 5.1 mmol/L   Chloride 90 (L) 98 - 111 mmol/L   BUN 29 (H) 8 - 23 mg/dL   Creatinine, Ser 1.61 0.44 - 1.00 mg/dL   Glucose, Bld 096 (H) 70 - 99 mg/dL    Comment: Glucose reference range applies only to samples taken after fasting for at least 8 hours.   Calcium, Ion 0.73 (LL) 1.15 - 1.40 mmol/L   TCO2 21 (L) 22 - 32 mmol/L   Hemoglobin 11.6 (L) 12.0 - 15.0 g/dL   HCT 04.5 (L) 40.9 - 81.1 %   Comment NOTIFIED PHYSICIAN    DG Chest Port 1 View  Result Date: 02/17/2023 CLINICAL DATA:  sob EXAM:  PORTABLE CHEST 1 VIEW COMPARISON:  CXR 01/22/23 FINDINGS: Small left pleural effusion. No pneumothorax. Redemonstrated bilateral patchy airspace opacities increased in the right upper and lower lung fields. Unchanged cardiac and mediastinal contours. No radiographically apparent displaced rib fracture. Visualized  upper abdomen is unremarkable. IMPRESSION: 1. Redemonstrated bilateral patchy airspace opacities increased in the right upper and lower lung fields. 2. Small left pleural effusion. Electronically Signed   By: Lorenza Cambridge M.D.   On: 02/17/2023 14:14    Pending Labs Unresulted Labs (From admission, onward)     Start     Ordered   02/17/23 1337  Brain natriuretic peptide  Once,   URGENT        02/17/23 1336   02/17/23 1334  Lactic acid, plasma  ONCE - STAT,   STAT        02/17/23 1333   02/17/23 1334  CBC  Once,   STAT        02/17/23 1333   02/17/23 1334  Comprehensive metabolic panel  Once,   STAT        02/17/23 1333            Vitals/Pain Today's Vitals   02/17/23 1330 02/17/23 1345 02/17/23 1400 02/17/23 1415  BP: (!) 130/99 129/73 118/76 104/89  Pulse: (!) 104 (!) 109 (!) 106 (!) 108  Resp: (!) 22 (!) 27 (!) 26 (!) 32  Temp:      TempSrc:      SpO2: 100% 100% 100% 100%  Weight:      Height:        Isolation Precautions No active isolations  Medications Medications  sodium chloride flush (NS) 0.9 % injection 3 mL (3 mLs Intravenous Not Given 02/17/23 1510)  sodium chloride flush (NS) 0.9 % injection 3 mL (has no administration in time range)  0.9 %  sodium chloride infusion (has no administration in time range)  morphine (PF) 2 MG/ML injection 1 mg (has no administration in time range)  LORazepam (ATIVAN) tablet 1 mg ( Oral See Alternative 02/17/23 1523)    Or  LORazepam (ATIVAN) 2 MG/ML concentrated solution 1 mg (1 mg Sublingual Given 02/17/23 1523)    Or  LORazepam (ATIVAN) injection 1 mg ( Intravenous See Alternative 02/17/23 1523)  diphenhydrAMINE (BENADRYL)  injection 12.5 mg (has no administration in time range)  ondansetron (ZOFRAN-ODT) disintegrating tablet 4 mg (has no administration in time range)    Or  ondansetron (ZOFRAN) injection 4 mg (has no administration in time range)  sodium chloride 0.9 % bolus 2,000 mL (0 mLs Intravenous Stopped 02/17/23 1342)    Mobility non-ambulatory     Focused Assessments Neuro Assessment Handoff:   Cardiac Rhythm: Sinus tachycardia       Neuro Assessment:   Neuro Checks:      Has TPA been given? No If patient is a Neuro Trauma and patient is going to OR before floor call report to 4N Charge nurse: [phone] or [phone]   R Recommendations: See Admitting Provider Note  Report given to:   Additional Notes:

## 2023-02-17 NOTE — ED Triage Notes (Signed)
Pt arrived via [facility] from [facility]. Pt was found to be unresponsive by staff. Pt's LKW is unknown. Per EMS, staff told them they believe pt was sleeping around 1030 today then went back in and found pt unresponsive. Pt arrived being bagged by EMS. Per EMS, once they started bagging her she started to have purposeful movements. EDP at bedside

## 2023-02-17 NOTE — Discharge Summary (Signed)
Physician Discharge Summary   Patient: [name] MRN: [id number] DOB: 01/23/1946  Admit date:     02/17/2023  Discharge date: 02/17/23  Discharge Physician: Darlin Drop   PCP: Burton Apley, MD   Recommendations at discharge:  Continue hospice care.  Discharge Diagnoses: Principal Problem:   Unresponsiveness  Resolved Problems:   * No resolved hospital problems. *  Hospital Course:  [name] is a 77 y.o. 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  Unclear how long she was in a state of unresponsiveness.  EMS was activated and she was brought into the ED for further evaluation.   In the ED, severely hypotensive with MAP of 46, tachycardic, tachypneic, hypoxic.  She was promptly placed on BiPAP.  Chest x-ray revealed bilateral patchy airspace opacities, severe right sided pulmonary infiltrates, small left pleural effusion.  Lab studies were notable for serum sodium 119, glucose 207, BUN 29 with creatinine of 0.90.  Lactic acid 4.6.  Hemoglobin 11.6 K.  EDP discussed with the patient's husband and son at bedside.  Family made decision for comfort care only.  Palliative care medicine has been consulted.  TRH, hospitalist service, was asked to admit for comfort care measures.   Admitted to MedSurg unit as observation status for comfort care only.  Multiple family members are present at bedside at the time of this visit.  The patient appears comfortable.  Family made decision to discharge to hospice care.  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 Pleasure feedings Palliative care medicine consulted by EDP. TOC consulted to assist with disposition.      Consultants: Palliative care medicine Procedures performed: None Disposition: Discharge to hospice care. Diet recommendation:  Pleasure feeding. DISCHARGE MEDICATION: Allergies as of 02/17/2023       Reactions   Cefdinir Diarrhea   Erythromycin Base Diarrhea   Lactose Diarrhea   Lactulose Diarrhea   Mesalamine Nausea Only   Methotrexate Other (See Comments)   Felt sick   Nitrofuran Derivatives    shakiness   Other Diarrhea, Other (See Comments)   Shaking uncontrollably "lettuce only"   Sulfa Antibiotics Other (See Comments)   Patient can't recall reaction, but made her feel "really sick"   Tdap [tetanus-diphth-acell Pertussis]    Shaking uncontrollably   Tetanus Toxoid, Adsorbed Other (See Comments)   Shaking uncontrollable         Medication List     STOP taking these medications    albuterol 108 (90 Base) MCG/ACT inhaler Commonly known as: VENTOLIN HFA   aspirin EC 81 MG tablet   benzonatate 200 MG capsule Commonly known as: TESSALON   Biofreeze Cool The Pain 4 % Gel Generic drug: Menthol (Topical Analgesic)   cetirizine 10 MG tablet Commonly known as: ZYRTEC   clidinium-chlordiazePOXIDE 5-2.5 MG capsule Commonly known as: LIBRAX   colestipol 1 g tablet Commonly known as: COLESTID   dicyclomine 20 MG tablet Commonly known as: BENTYL  Esbriet 801 MG Tabs Generic drug: Pirfenidone   FLUoxetine 20 MG capsule Commonly known as: PROZAC   fluticasone 50 MCG/ACT nasal spray Commonly known as: FLONASE   furosemide 80 MG tablet Commonly known as:  LASIX   ipratropium 0.03 % nasal spray Commonly known as: ATROVENT   ipratropium-albuterol 0.5-2.5 (3) MG/3ML Soln Commonly known as: DUONEB   leflunomide 20 MG tablet Commonly known as: ARAVA   loperamide 2 MG capsule Commonly known as: IMODIUM   methocarbamol 750 MG tablet Commonly known as: ROBAXIN   montelukast 10 MG tablet Commonly known as: SINGULAIR   omeprazole 40 MG capsule Commonly known as: PRILOSEC   Oxycodone HCl 10 MG Tabs   OXYGEN   potassium chloride SA 20 MEQ tablet Commonly known as: KLOR-CON M   predniSONE 5 MG tablet Commonly known as: DELTASONE   rosuvastatin 20 MG tablet Commonly known as: CRESTOR   Systane 0.4-0.3 % Gel ophthalmic gel Generic drug: Polyethyl Glycol-Propyl Glycol   traZODone 100 MG tablet Commonly known as: DESYREL   Trelegy Ellipta 100-62.5-25 MCG/ACT Aepb Generic drug: Fluticasone-Umeclidin-Vilant   Tyvaso DPI Maintenance Kit 64 MCG Powd Generic drug: Treprostinil   verapamil 120 MG CR tablet Commonly known as: CALAN-SR   Vitamin D3 250 MCG (10000 UT) capsule        Discharge Exam: Filed Weights   02/17/23 1242  Weight: 75.1 kg  Minimally responsive.  On comfort care measures only.   Condition at discharge: Fair.  The results of significant diagnostics from this hospitalization (including imaging, microbiology, ancillary and laboratory) are listed below for reference.   Imaging Studies: DG Chest Port 1 View  Result Date: 02/17/2023 CLINICAL DATA:  sob EXAM: PORTABLE CHEST 1 VIEW COMPARISON:  CXR 01/22/23 FINDINGS: Small left pleural effusion. No pneumothorax. Redemonstrated bilateral patchy airspace opacities increased in the right upper and lower lung fields. Unchanged cardiac and mediastinal contours. No radiographically apparent displaced rib fracture. Visualized upper abdomen is unremarkable. IMPRESSION: 1. Redemonstrated bilateral patchy airspace opacities increased in the right upper and lower lung  fields. 2. Small left pleural effusion. Electronically Signed   By: Lorenza Cambridge M.D.   On: 02/17/2023 14:14   ECHOCARDIOGRAM COMPLETE  Result Date: 01/23/2023    ECHOCARDIOGRAM REPORT   Patient Name:   [name] Date of Exam: 01/23/2023 Medical Rec #:  [id number]      Height:       60.0 in Accession #:    [id number]     Weight:       166.0 lb Date of Birth:  08/11/1945      BSA:          1.725 m Patient Age:    66 years       BP:           129/86 mmHg Patient Gender: F              HR:           89 bpm. Exam Location:  Inpatient Procedure: 2D Echo, Cardiac Doppler and Color Doppler Indications:    Pulmonary hypertension/RV failure  History:        Patient has prior history of Echocardiogram examinations.  Sonographer:    NA Referring Phys: [id number] VINEET SOOD IMPRESSIONS  1. Left ventricular ejection fraction, by estimation, is 40 to 45%. The left ventricle has mildly decreased function. The left ventricle demonstrates global hypokinesis. Left ventricular diastolic parameters are consistent with Grade I diastolic dysfunction (impaired relaxation).  2.  D-shaped septum suggesting RV pressure/volume overload. Right ventricular systolic function is severely reduced. The right ventricular size is moderately enlarged. There is moderately elevated pulmonary artery systolic pressure. The estimated right ventricular systolic pressure is 50.3 mmHg.  3. The mitral valve is normal in structure. No evidence of mitral valve regurgitation. No evidence of mitral stenosis. Moderate mitral annular calcification.  4. The aortic valve is tricuspid. There is mild calcification of the aortic valve. Aortic valve regurgitation is trivial. No aortic stenosis is present.  5. The inferior vena cava is normal in size with greater than 50% respiratory variability, suggesting right atrial pressure of 3 mmHg. FINDINGS  Left Ventricle: Left ventricular ejection fraction, by estimation, is 40 to 45%. The left ventricle has mildly decreased  function. The left ventricle demonstrates global hypokinesis. The left ventricular internal cavity size was normal in size. There is  no left ventricular hypertrophy. Left ventricular diastolic parameters are consistent with Grade I diastolic dysfunction (impaired relaxation). Right Ventricle: D-shaped septum suggesting RV pressure/volume overload. The right ventricular size is moderately enlarged. No increase in right ventricular wall thickness. Right ventricular systolic function is severely reduced. There is moderately elevated pulmonary artery systolic pressure. The tricuspid regurgitant velocity is 3.44 m/s, and with an assumed right atrial pressure of 3 mmHg, the estimated right ventricular systolic pressure is 50.3 mmHg. Left Atrium: Left atrial size was normal in size. Right Atrium: Right atrial size was normal in size. Pericardium: Trivial pericardial effusion is present. Mitral Valve: The mitral valve is normal in structure. There is mild calcification of the mitral valve leaflet(s). Moderate mitral annular calcification. No evidence of mitral valve regurgitation. No evidence of mitral valve stenosis. Tricuspid Valve: The tricuspid valve is normal in structure. Tricuspid valve regurgitation is mild. Aortic Valve: The aortic valve is tricuspid. There is mild calcification of the aortic valve. Aortic valve regurgitation is trivial. No aortic stenosis is present. Pulmonic Valve: The pulmonic valve was normal in structure. Pulmonic valve regurgitation is not visualized. Aorta: The aortic root is normal in size and structure. Venous: The inferior vena cava is normal in size with greater than 50% respiratory variability, suggesting right atrial pressure of 3 mmHg. IAS/Shunts: No atrial level shunt detected by color flow Doppler.  LEFT VENTRICLE PLAX 2D LVIDd:         4.40 cm LVIDs:         3.60 cm LV PW:         1.00 cm LV IVS:        1.10 cm LVOT diam:     2.00 cm LV SV:         40 LV SV Index:   23 LVOT Area:      3.14 cm  RIGHT VENTRICLE RV Basal diam:  3.30 cm TAPSE (M-mode): 1.1 cm LEFT ATRIUM             Index        RIGHT ATRIUM           Index LA diam:        3.50 cm 2.03 cm/m   RA Area:     16.10 cm LA Vol (A2C):   36.4 ml 21.11 ml/m  RA Volume:   39.50 ml  22.90 ml/m LA Vol (A4C):   35.5 ml 20.59 ml/m LA Biplane Vol: 38.3 ml 22.21 ml/m  AORTIC VALVE LVOT Vmax:   75.80 cm/s LVOT Vmean:  48.200 cm/s LVOT VTI:    0.128 m  AORTA Ao Root diam:  3.00 cm Ao Asc diam:  3.50 cm TRICUSPID VALVE TR Peak grad:   47.3 mmHg TR Vmax:        344.00 cm/s  SHUNTS Systemic VTI:  0.13 m Systemic Diam: 2.00 cm Dalton McleanMD Electronically signed by Wilfred Lacy Signature Date/Time: 01/23/2023/5:45:21 PM    Final    DG Chest Port 1 View  Result Date: 01/22/2023 CLINICAL DATA:  Short of breath EXAM: PORTABLE CHEST 1 VIEW COMPARISON:  None Available. FINDINGS: Normal cardiac silhouette. LEFT basilar atelectasis similar prior. Chronic bronchitic markings. No effusion, pulmonary edema or infiltrate. No acute osseous abnormality. IMPRESSION: 1. LEFT basilar atelectasis. 2. Chronic bronchitic markings. Electronically Signed   By: Genevive Bi M.D.   On: 01/22/2023 09:10   DG Abd 1 View  Result Date: 01/21/2023 CLINICAL DATA:  Abdominal pain. EXAM: ABDOMEN - 1 VIEW COMPARISON:  None Available. FINDINGS: No bowel dilatation to suggest obstruction. Small volume of formed stool in the colon. Surgical clips in the right upper quadrant, typical of cholecystectomy. Lumbosacral fusion hardware. No definite radiopaque calculi. IMPRESSION: Normal bowel gas pattern.  No radiographic explanation for pain. Electronically Signed   By: Narda Rutherford M.D.   On: 01/21/2023 21:26   DG Chest Portable 1 View  Result Date: 01/20/2023 CLINICAL DATA:  Shortness of breath EXAM: PORTABLE CHEST 1 VIEW COMPARISON:  04/20/2022 FINDINGS: Cardiomegaly with vascular congestion. Perihilar and infrahilar opacities could reflect edema. Peripheral  coarsened opacities compatible with fibrosis as seen on prior CT. No effusions. Aortic atherosclerosis. No acute bony abnormality. IMPRESSION: Cardiomegaly with vascular congestion and perihilar opacities which could reflect pulmonary edema. Chronic changes/fibrosis. Electronically Signed   By: Charlett Nose M.D.   On: 01/20/2023 22:44    Microbiology: Results for orders placed or performed during the hospital encounter of 01/20/23  SARS Coronavirus 2 by RT PCR (hospital order, performed in [facility] hospital lab) *cepheid single result test* Anterior Nasal Swab     Status: None   Collection Time: 01/20/23 10:06 PM   Specimen: Anterior Nasal Swab  Result Value Ref Range Status   SARS Coronavirus 2 by RT PCR NEGATIVE NEGATIVE Final    Comment: Performed at [facility], [address] [place], [place] [address]  C Difficile Quick Screen w PCR reflex     Status: None   Collection Time: 01/21/23 11:20 AM   Specimen: Stool  Result Value Ref Range Status   C Diff antigen NEGATIVE NEGATIVE Final   C Diff toxin NEGATIVE NEGATIVE Final   C Diff interpretation No C. difficile detected.  Final    Comment: Performed at [facility], [address] [place], [place] [address]  Gastrointestinal Panel by PCR , Stool     Status: Abnormal   Collection Time: 01/21/23  1:36 PM   Specimen: Stool  Result Value Ref Range Status   Campylobacter species NOT DETECTED NOT DETECTED Final   Plesimonas shigelloides NOT DETECTED NOT DETECTED Final   Salmonella species NOT DETECTED NOT DETECTED Final   Yersinia enterocolitica NOT DETECTED NOT DETECTED Final   Vibrio species NOT DETECTED NOT DETECTED Final   Vibrio cholerae NOT DETECTED NOT DETECTED Final   Enteroaggregative E coli (EAEC) NOT DETECTED NOT DETECTED Final   Enteropathogenic E coli (EPEC) NOT DETECTED NOT DETECTED Final   Enterotoxigenic E coli (ETEC) NOT DETECTED NOT DETECTED Final   Shiga like toxin producing E coli (STEC) NOT DETECTED  NOT DETECTED Final   Shigella/Enteroinvasive E coli (EIEC) NOT DETECTED  NOT DETECTED Final   Cryptosporidium NOT DETECTED NOT DETECTED Final   Cyclospora cayetanensis NOT DETECTED NOT DETECTED Final   Entamoeba histolytica NOT DETECTED NOT DETECTED Final   Giardia lamblia NOT DETECTED NOT DETECTED Final   Adenovirus F40/41 NOT DETECTED NOT DETECTED Final   Astrovirus NOT DETECTED NOT DETECTED Final   Norovirus GI/GII DETECTED (A) NOT DETECTED Final    Comment: CRITICAL RESULT CALLED TO, READ BACK BY AND VERIFIED WITH: MILLS,AMY RN @ 01/22/23 @ 0203 LFD    Rotavirus A NOT DETECTED NOT DETECTED Final   Sapovirus (I, II, IV, and V) NOT DETECTED NOT DETECTED Final    Comment: Performed at [facility], [address] [place], [place] [address]  Urine Culture (for pregnant, neutropenic or urologic patients or patients with an indwelling urinary catheter)     Status: Abnormal   Collection Time: 01/21/23  7:03 PM   Specimen: Urine, Catheterized  Result Value Ref Range Status   Specimen Description URINE, CATHETERIZED  Final   Special Requests   Final    NONE Performed at [facility], [address], [place], [place] [address]    Culture (A)  Final    20,000 COLONIES/mL KLEBSIELLA PNEUMONIAE Confirmed Extended Spectrum Beta-Lactamase Producer (ESBL).  In bloodstream infections from ESBL organisms, carbapenems are preferred over piperacillin/tazobactam. They are shown to have a lower risk of mortality.    Report Status 01/23/2023 FINAL  Final   Organism ID, Bacteria KLEBSIELLA PNEUMONIAE (A)  Final      Susceptibility   Klebsiella pneumoniae - MIC*    AMPICILLIN >=32 RESISTANT Resistant     CEFAZOLIN >=64 RESISTANT Resistant     CEFEPIME >=32 RESISTANT Resistant     CEFTRIAXONE >=64 RESISTANT Resistant     CIPROFLOXACIN 2 RESISTANT Resistant     GENTAMICIN >=16 RESISTANT Resistant     IMIPENEM <=0.25 SENSITIVE Sensitive     NITROFURANTOIN 128 RESISTANT Resistant      TRIMETH/SULFA >=320 RESISTANT Resistant     AMPICILLIN/SULBACTAM >=32 RESISTANT Resistant     PIP/TAZO 32 INTERMEDIATE Intermediate     * 20,000 COLONIES/mL KLEBSIELLA PNEUMONIAE  MRSA Next Gen by PCR, Nasal     Status: Abnormal   Collection Time: 01/22/23  5:48 AM   Specimen: Nasal Mucosa; Nasal Swab  Result Value Ref Range Status   MRSA by PCR Next Gen DETECTED (A) NOT DETECTED Final    Comment: (NOTE) The GeneXpert MRSA Assay (FDA approved for NASAL specimens only), is one component of a comprehensive MRSA colonization surveillance program. It is not intended to diagnose MRSA infection nor to guide or monitor treatment for MRSA infections. Test performance is not FDA approved in patients less than 32 years old. Performed at [facility], [address], [place], [place] [address]   Respiratory (~20 pathogens) panel by PCR     Status: Abnormal   Collection Time: 01/22/23  5:48 AM   Specimen: Nasopharyngeal Swab; Respiratory  Result Value Ref Range Status   Adenovirus NOT DETECTED NOT DETECTED Final   Coronavirus 229E NOT DETECTED NOT DETECTED Final    Comment: (NOTE) The Coronavirus on the Respiratory Panel, DOES NOT test for the novel  Coronavirus (2019 nCoV)    Coronavirus HKU1 DETECTED (A) NOT DETECTED Final   Coronavirus NL63 NOT DETECTED NOT DETECTED Final   Coronavirus OC43 NOT DETECTED NOT DETECTED Final   Metapneumovirus NOT DETECTED NOT DETECTED Final   Rhinovirus / Enterovirus NOT DETECTED NOT DETECTED Final  Influenza A NOT DETECTED NOT DETECTED Final   Influenza B NOT DETECTED NOT DETECTED Final   Parainfluenza Virus 1 NOT DETECTED NOT DETECTED Final   Parainfluenza Virus 2 NOT DETECTED NOT DETECTED Final   Parainfluenza Virus 3 NOT DETECTED NOT DETECTED Final   Parainfluenza Virus 4 NOT DETECTED NOT DETECTED Final   Respiratory Syncytial Virus NOT DETECTED NOT DETECTED Final   Bordetella pertussis NOT DETECTED NOT DETECTED Final   Bordetella  Parapertussis NOT DETECTED NOT DETECTED Final   Chlamydophila pneumoniae NOT DETECTED NOT DETECTED Final   Mycoplasma pneumoniae NOT DETECTED NOT DETECTED Final    Comment: Performed at [facility], [address], [place], [place] [address]    Labs: CBC: Recent Labs  Lab 02/17/23 1251 02/17/23 1252  HGB 11.6* 11.6*  HCT 34.0* 34.0*   Basic Metabolic Panel: Recent Labs  Lab 02/17/23 1251 02/17/23 1252  NA 120* 119*  K 5.0 4.9  CL  --  90*  GLUCOSE  --  207*  BUN  --  29*  CREATININE  --  0.90   Liver Function Tests: No results for input(s): "AST", "ALT", "ALKPHOS", "BILITOT", "PROT", "ALBUMIN" in the last 168 hours. CBG: No results for input(s): "GLUCAP" in the last 168 hours.  Discharge time spent: less than 30 minutes.  Signed: Darlin Drop, DO [facility] 02/17/2023

## 2023-02-17 NOTE — Progress Notes (Addendum)
[facility] Liaison note:  This patient  is a pending for [facility] outpatient-based palliative care. Hospital Liaison will continue to follow for discharge disposition. Please call for any outpatient based palliative care related questions or concerns.  Thank you, Thea Gist, BSN [facility] liaison  [phone]

## 2023-02-21 ENCOUNTER — Inpatient Hospital Stay: Payer: Medicare Other | Admitting: Internal Medicine

## 2023-02-21 NOTE — Telephone Encounter (Signed)
Per [facility]- patient was admitted to hospice care 02/17/23 and passed . Closing encounter.

## 2023-03-15 ENCOUNTER — Encounter (INDEPENDENT_AMBULATORY_CARE_PROVIDER_SITE_OTHER): Payer: Medicare Other | Admitting: Ophthalmology

## 2023-03-15 DIAGNOSIS — Z79899 Other long term (current) drug therapy: Secondary | ICD-10-CM

## 2023-03-15 DIAGNOSIS — H353131 Nonexudative age-related macular degeneration, bilateral, early dry stage: Secondary | ICD-10-CM

## 2023-03-15 DIAGNOSIS — I10 Essential (primary) hypertension: Secondary | ICD-10-CM

## 2023-03-15 DIAGNOSIS — Z961 Presence of intraocular lens: Secondary | ICD-10-CM

## 2023-03-15 DIAGNOSIS — H35033 Hypertensive retinopathy, bilateral: Secondary | ICD-10-CM

## 2023-03-15 DIAGNOSIS — M059 Rheumatoid arthritis with rheumatoid factor, unspecified: Secondary | ICD-10-CM

## 2023-03-15 DIAGNOSIS — H35372 Puckering of macula, left eye: Secondary | ICD-10-CM

## 2023-03-19 DEATH — deceased

## 2023-08-01 ENCOUNTER — Telehealth: Payer: Self-pay | Admitting: Internal Medicine

## 2023-08-01 NOTE — Telephone Encounter (Signed)
    [name]\ - died 02/19/23. Only knew 2023-08-16 . Called and expreseed condolence to husband. Married 59 years. '  Plan   - please mark her as decaseed     SIGNATURE    Dr. Dorethia Cave, M.D., F.C.C.P,  Pulmonary and Critical Care Medicine Staff Physician, [facility] Director - Interstitial Lung Disease  Program  Pulmonary Fibrosis [facility] at [facility] [place], [place], [address]   Pager: [phone], If no answer  -> Check AMION or Try [phone] Telephone (clinical office): [phone] Telephone (research): [phone]  6:16 PM 08-16-23

## 2023-08-10 NOTE — Telephone Encounter (Signed)
Marked deceased. NFN
